# Patient Record
Sex: Female | Born: 1940
Health system: Southern US, Community
[De-identification: ages and names within clinical notes are randomized; demographics above are authoritative.]

## PROBLEM LIST (undated history)

## (undated) DIAGNOSIS — F419 Anxiety disorder, unspecified: Secondary | ICD-10-CM

## (undated) DIAGNOSIS — F329 Major depressive disorder, single episode, unspecified: Secondary | ICD-10-CM

## (undated) DIAGNOSIS — J45909 Unspecified asthma, uncomplicated: Secondary | ICD-10-CM

## (undated) DIAGNOSIS — F32A Depression, unspecified: Secondary | ICD-10-CM

## (undated) DIAGNOSIS — Z5111 Encounter for antineoplastic chemotherapy: Secondary | ICD-10-CM

## (undated) DIAGNOSIS — R519 Headache, unspecified: Secondary | ICD-10-CM

## (undated) DIAGNOSIS — Z9289 Personal history of other medical treatment: Secondary | ICD-10-CM

## (undated) DIAGNOSIS — G620 Drug-induced polyneuropathy: Secondary | ICD-10-CM

## (undated) DIAGNOSIS — R0902 Hypoxemia: Secondary | ICD-10-CM

## (undated) DIAGNOSIS — I509 Heart failure, unspecified: Secondary | ICD-10-CM

## (undated) DIAGNOSIS — G4700 Insomnia, unspecified: Secondary | ICD-10-CM

## (undated) DIAGNOSIS — IMO0002 Reserved for concepts with insufficient information to code with codable children: Secondary | ICD-10-CM

## (undated) DIAGNOSIS — J189 Pneumonia, unspecified organism: Secondary | ICD-10-CM

## (undated) DIAGNOSIS — E785 Hyperlipidemia, unspecified: Secondary | ICD-10-CM

## (undated) DIAGNOSIS — K219 Gastro-esophageal reflux disease without esophagitis: Secondary | ICD-10-CM

## (undated) DIAGNOSIS — I219 Acute myocardial infarction, unspecified: Secondary | ICD-10-CM

## (undated) DIAGNOSIS — J449 Chronic obstructive pulmonary disease, unspecified: Secondary | ICD-10-CM

## (undated) DIAGNOSIS — D649 Anemia, unspecified: Secondary | ICD-10-CM

## (undated) DIAGNOSIS — IMO0001 Reserved for inherently not codable concepts without codable children: Secondary | ICD-10-CM

## (undated) DIAGNOSIS — F40240 Claustrophobia: Secondary | ICD-10-CM

## (undated) DIAGNOSIS — C649 Malignant neoplasm of unspecified kidney, except renal pelvis: Secondary | ICD-10-CM

## (undated) DIAGNOSIS — R51 Headache: Secondary | ICD-10-CM

## (undated) DIAGNOSIS — H409 Unspecified glaucoma: Secondary | ICD-10-CM

## (undated) DIAGNOSIS — R011 Cardiac murmur, unspecified: Secondary | ICD-10-CM

## (undated) DIAGNOSIS — Z923 Personal history of irradiation: Secondary | ICD-10-CM

## (undated) DIAGNOSIS — M1990 Unspecified osteoarthritis, unspecified site: Secondary | ICD-10-CM

## (undated) DIAGNOSIS — C3491 Malignant neoplasm of unspecified part of right bronchus or lung: Secondary | ICD-10-CM

## (undated) DIAGNOSIS — I10 Essential (primary) hypertension: Secondary | ICD-10-CM

## (undated) DIAGNOSIS — T451X5A Adverse effect of antineoplastic and immunosuppressive drugs, initial encounter: Secondary | ICD-10-CM

## (undated) HISTORY — PX: TUBAL LIGATION: SHX77

## (undated) HISTORY — PX: NEPHRECTOMY: SHX65

## (undated) HISTORY — DX: Heart failure, unspecified: I50.9

## (undated) HISTORY — DX: Gastro-esophageal reflux disease without esophagitis: K21.9

## (undated) HISTORY — DX: Cardiac murmur, unspecified: R01.1

## (undated) HISTORY — DX: Encounter for antineoplastic chemotherapy: Z51.11

## (undated) HISTORY — DX: Unspecified asthma, uncomplicated: J45.909

## (undated) HISTORY — DX: Malignant neoplasm of unspecified part of right bronchus or lung: C34.91

## (undated) HISTORY — DX: Drug-induced polyneuropathy: G62.0

## (undated) HISTORY — DX: Hyperlipidemia, unspecified: E78.5

## (undated) HISTORY — DX: Hypoxemia: R09.02

## (undated) HISTORY — DX: Essential (primary) hypertension: I10

## (undated) HISTORY — DX: Unspecified glaucoma: H40.9

## (undated) HISTORY — PX: SPINE SURGERY: SHX786

## (undated) HISTORY — DX: Depression, unspecified: F32.A

## (undated) HISTORY — PX: BACK SURGERY: SHX140

## (undated) HISTORY — DX: Malignant neoplasm of unspecified kidney, except renal pelvis: C64.9

## (undated) HISTORY — DX: Adverse effect of antineoplastic and immunosuppressive drugs, initial encounter: T45.1X5A

## (undated) HISTORY — DX: Insomnia, unspecified: G47.00

## (undated) HISTORY — DX: Personal history of other medical treatment: Z92.89

## (undated) HISTORY — PX: LOBECTOMY: SHX5089

## (undated) HISTORY — DX: Reserved for concepts with insufficient information to code with codable children: IMO0002

## (undated) HISTORY — DX: Acute myocardial infarction, unspecified: I21.9

## (undated) HISTORY — PX: OTHER SURGICAL HISTORY: SHX169

## (undated) HISTORY — DX: Major depressive disorder, single episode, unspecified: F32.9

## (undated) HISTORY — PX: EYE SURGERY: SHX253

## (undated) HISTORY — DX: Reserved for inherently not codable concepts without codable children: IMO0001

---

## 1998-09-02 ENCOUNTER — Ambulatory Visit (HOSPITAL_COMMUNITY): Admission: RE | Admit: 1998-09-02 | Discharge: 1998-09-02 | Payer: Self-pay | Admitting: Urology

## 1998-09-02 ENCOUNTER — Encounter: Payer: Self-pay | Admitting: Urology

## 1998-09-19 ENCOUNTER — Other Ambulatory Visit: Admission: RE | Admit: 1998-09-19 | Discharge: 1998-09-19 | Payer: Self-pay | Admitting: Obstetrics and Gynecology

## 2000-03-12 ENCOUNTER — Encounter: Payer: Self-pay | Admitting: Urology

## 2000-03-12 ENCOUNTER — Ambulatory Visit (HOSPITAL_COMMUNITY): Admission: RE | Admit: 2000-03-12 | Discharge: 2000-03-12 | Payer: Self-pay | Admitting: Urology

## 2000-03-21 ENCOUNTER — Ambulatory Visit (HOSPITAL_COMMUNITY): Admission: RE | Admit: 2000-03-21 | Discharge: 2000-03-21 | Payer: Self-pay | Admitting: Urology

## 2000-03-21 ENCOUNTER — Encounter: Payer: Self-pay | Admitting: Urology

## 2000-05-04 ENCOUNTER — Encounter: Payer: Self-pay | Admitting: Internal Medicine

## 2000-05-04 ENCOUNTER — Ambulatory Visit (HOSPITAL_COMMUNITY): Admission: RE | Admit: 2000-05-04 | Discharge: 2000-05-04 | Payer: Self-pay | Admitting: Internal Medicine

## 2000-05-08 ENCOUNTER — Encounter: Payer: Self-pay | Admitting: Neurosurgery

## 2000-05-08 ENCOUNTER — Ambulatory Visit (HOSPITAL_COMMUNITY): Admission: RE | Admit: 2000-05-08 | Discharge: 2000-05-09 | Payer: Self-pay | Admitting: Neurosurgery

## 2000-05-30 ENCOUNTER — Encounter: Admission: RE | Admit: 2000-05-30 | Discharge: 2000-05-30 | Payer: Self-pay | Admitting: Neurosurgery

## 2000-05-30 ENCOUNTER — Encounter: Payer: Self-pay | Admitting: Neurosurgery

## 2000-07-01 ENCOUNTER — Encounter: Admission: RE | Admit: 2000-07-01 | Discharge: 2000-07-01 | Payer: Self-pay | Admitting: Neurosurgery

## 2000-07-01 ENCOUNTER — Encounter: Payer: Self-pay | Admitting: Neurosurgery

## 2000-08-20 ENCOUNTER — Encounter: Admission: RE | Admit: 2000-08-20 | Discharge: 2000-08-20 | Payer: Self-pay | Admitting: Neurosurgery

## 2000-08-20 ENCOUNTER — Encounter: Payer: Self-pay | Admitting: Neurosurgery

## 2001-08-20 ENCOUNTER — Encounter: Payer: Self-pay | Admitting: Neurosurgery

## 2001-08-20 ENCOUNTER — Encounter: Admission: RE | Admit: 2001-08-20 | Discharge: 2001-08-20 | Payer: Self-pay | Admitting: Neurosurgery

## 2009-12-08 LAB — LIPID PANEL
Cholesterol: 189 mg/dL (ref 0–200)
HDL: 75 mg/dL — AB (ref 35–70)
LDL Cholesterol: 100 mg/dL
Triglycerides: 72 mg/dL (ref 40–160)

## 2009-12-08 LAB — HM PAP SMEAR: HM Pap smear: NORMAL

## 2009-12-08 LAB — TSH: TSH: 0.82 u[IU]/mL (ref <=5.90)

## 2009-12-10 LAB — BASIC METABOLIC PANEL: Glucose: 105 mg/dL

## 2010-11-12 HISTORY — PX: NEPHRECTOMY: SHX65

## 2011-02-05 ENCOUNTER — Other Ambulatory Visit (HOSPITAL_COMMUNITY): Payer: Self-pay | Admitting: Urology

## 2011-02-05 DIAGNOSIS — N320 Bladder-neck obstruction: Secondary | ICD-10-CM

## 2011-02-09 ENCOUNTER — Encounter (HOSPITAL_COMMUNITY)
Admission: RE | Admit: 2011-02-09 | Discharge: 2011-02-09 | Disposition: A | Discharge status: Home / Self Care | Payer: Medicare Other | Source: Ambulatory Visit | Attending: Urology | Admitting: Urology

## 2011-02-09 ENCOUNTER — Other Ambulatory Visit (HOSPITAL_COMMUNITY): Payer: Self-pay | Admitting: Urology

## 2011-02-09 DIAGNOSIS — N289 Disorder of kidney and ureter, unspecified: Secondary | ICD-10-CM

## 2011-02-09 DIAGNOSIS — N320 Bladder-neck obstruction: Secondary | ICD-10-CM | POA: Insufficient documentation

## 2011-02-09 MED ORDER — TECHNETIUM TC 99M MERTIATIDE
15.0000 | Freq: Once | INTRAVENOUS | Status: AC | PRN
  Administered 2011-02-09: 15 via INTRAVENOUS

## 2011-02-10 ENCOUNTER — Ambulatory Visit (HOSPITAL_COMMUNITY)
Admission: RE | Admit: 2011-02-10 | Discharge: 2011-02-10 | Disposition: A | Discharge status: Home / Self Care | Payer: Medicare Other | Source: Ambulatory Visit | Attending: Urology | Admitting: Urology

## 2011-02-10 DIAGNOSIS — N289 Disorder of kidney and ureter, unspecified: Secondary | ICD-10-CM

## 2011-02-10 DIAGNOSIS — F99 Mental disorder, not otherwise specified: Secondary | ICD-10-CM | POA: Insufficient documentation

## 2011-02-10 MED ORDER — GADOBENATE DIMEGLUMINE 529 MG/ML IV SOLN
13.0000 mL | Freq: Once | INTRAVENOUS | Status: AC | PRN
  Administered 2011-02-10: 13 mL via INTRAVENOUS

## 2011-03-27 ENCOUNTER — Other Ambulatory Visit (HOSPITAL_COMMUNITY): Payer: Self-pay | Admitting: Urology

## 2011-03-27 ENCOUNTER — Ambulatory Visit (HOSPITAL_COMMUNITY)
Admission: RE | Admit: 2011-03-27 | Discharge: 2011-03-27 | Disposition: A | Discharge status: Home / Self Care | Payer: Medicare Other | Source: Ambulatory Visit | Attending: Urology | Admitting: Urology

## 2011-03-27 DIAGNOSIS — Q619 Cystic kidney disease, unspecified: Secondary | ICD-10-CM | POA: Insufficient documentation

## 2011-03-27 DIAGNOSIS — N289 Disorder of kidney and ureter, unspecified: Secondary | ICD-10-CM | POA: Insufficient documentation

## 2011-03-27 DIAGNOSIS — R05 Cough: Secondary | ICD-10-CM

## 2011-03-27 DIAGNOSIS — R059 Cough, unspecified: Secondary | ICD-10-CM

## 2011-03-27 DIAGNOSIS — IMO0001 Reserved for inherently not codable concepts without codable children: Secondary | ICD-10-CM

## 2011-04-11 ENCOUNTER — Other Ambulatory Visit: Payer: Self-pay | Admitting: Urology

## 2011-04-11 ENCOUNTER — Encounter (HOSPITAL_COMMUNITY): Payer: Medicare Other

## 2011-04-11 LAB — BASIC METABOLIC PANEL
BUN: 14 mg/dL (ref 6–23)
CO2: 32 mEq/L (ref 19–32)
Calcium: 10.3 mg/dL (ref 8.4–10.5)
Chloride: 101 mEq/L (ref 96–112)
Creatinine, Ser: 0.68 mg/dL (ref 0.4–1.2)
GFR calc Af Amer: >60 mL/min (ref >60)
GFR calc non Af Amer: >60 mL/min (ref >60)
Glucose, Bld: 93 mg/dL (ref 70–99)
Potassium: 4.7 mEq/L (ref 3.5–5.1)
Sodium: 141 mEq/L (ref 135–145)

## 2011-04-11 LAB — CBC
HCT: 48.7 % — ABNORMAL HIGH (ref 36.0–46.0)
Hemoglobin: 16.5 g/dL — ABNORMAL HIGH (ref 12.0–15.0)
MCH: 31.1 pg (ref 26.0–34.0)
MCHC: 33.9 g/dL (ref 30.0–36.0)
MCV: 91.7 fL (ref 78.0–100.0)
Platelets: 291 10*3/uL (ref 150–400)
RBC: 5.31 MIL/uL — ABNORMAL HIGH (ref 3.87–5.11)
RDW: 15.8 % — ABNORMAL HIGH (ref 11.5–15.5)
WBC: 10 10*3/uL (ref 4.0–10.5)

## 2011-04-11 LAB — SURGICAL PCR SCREEN
MRSA, PCR: NEGATIVE
Staphylococcus aureus: NEGATIVE

## 2011-04-11 LAB — ABO/RH: ABO/RH(D): A POS

## 2011-04-16 ENCOUNTER — Other Ambulatory Visit: Payer: Self-pay | Admitting: Urology

## 2011-04-16 ENCOUNTER — Inpatient Hospital Stay (HOSPITAL_COMMUNITY)
Admission: RE | Admit: 2011-04-16 | Discharge: 2011-04-22 | DRG: 656 | Disposition: A | Discharge status: Home / Self Care | Payer: Medicare Other | Source: Ambulatory Visit | Attending: Urology | Admitting: Urology

## 2011-04-16 DIAGNOSIS — R51 Headache: Secondary | ICD-10-CM | POA: Diagnosis not present

## 2011-04-16 DIAGNOSIS — E785 Hyperlipidemia, unspecified: Secondary | ICD-10-CM | POA: Diagnosis present

## 2011-04-16 DIAGNOSIS — R0902 Hypoxemia: Secondary | ICD-10-CM | POA: Diagnosis not present

## 2011-04-16 DIAGNOSIS — R110 Nausea: Secondary | ICD-10-CM | POA: Diagnosis not present

## 2011-04-16 DIAGNOSIS — I509 Heart failure, unspecified: Secondary | ICD-10-CM | POA: Diagnosis not present

## 2011-04-16 DIAGNOSIS — K219 Gastro-esophageal reflux disease without esophagitis: Secondary | ICD-10-CM | POA: Diagnosis present

## 2011-04-16 DIAGNOSIS — D72829 Elevated white blood cell count, unspecified: Secondary | ICD-10-CM | POA: Diagnosis not present

## 2011-04-16 DIAGNOSIS — Z87891 Personal history of nicotine dependence: Secondary | ICD-10-CM

## 2011-04-16 DIAGNOSIS — C649 Malignant neoplasm of unspecified kidney, except renal pelvis: Principal | ICD-10-CM | POA: Diagnosis present

## 2011-04-16 DIAGNOSIS — I10 Essential (primary) hypertension: Secondary | ICD-10-CM | POA: Diagnosis present

## 2011-04-16 DIAGNOSIS — I5033 Acute on chronic diastolic (congestive) heart failure: Secondary | ICD-10-CM | POA: Diagnosis not present

## 2011-04-16 DIAGNOSIS — E871 Hypo-osmolality and hyponatremia: Secondary | ICD-10-CM | POA: Diagnosis not present

## 2011-04-16 LAB — BASIC METABOLIC PANEL
BUN: 9 mg/dL (ref 6–23)
CO2: 29 mEq/L (ref 19–32)
Calcium: 8.7 mg/dL (ref 8.4–10.5)
Chloride: 101 mEq/L (ref 96–112)
Creatinine, Ser: 0.76 mg/dL (ref 0.4–1.2)
GFR calc Af Amer: >60 mL/min (ref >60)
GFR calc non Af Amer: >60 mL/min (ref >60)
Glucose, Bld: 118 mg/dL — ABNORMAL HIGH (ref 70–99)
Potassium: 3.9 mEq/L (ref 3.5–5.1)
Sodium: 138 mEq/L (ref 135–145)

## 2011-04-16 LAB — TYPE AND SCREEN
ABO/RH(D): A POS
Antibody Screen: NEGATIVE

## 2011-04-16 LAB — HEMOGLOBIN AND HEMATOCRIT, BLOOD
HCT: 42.6 % (ref 36.0–46.0)
Hemoglobin: 14 g/dL (ref 12.0–15.0)

## 2011-04-17 LAB — BASIC METABOLIC PANEL
BUN: 9 mg/dL (ref 6–23)
CO2: 31 mEq/L (ref 19–32)
Calcium: 8.4 mg/dL (ref 8.4–10.5)
Chloride: 98 mEq/L (ref 96–112)
Creatinine, Ser: 0.72 mg/dL (ref 0.4–1.2)
GFR calc Af Amer: >60 mL/min (ref >60)
GFR calc non Af Amer: >60 mL/min (ref >60)
Glucose, Bld: 153 mg/dL — ABNORMAL HIGH (ref 70–99)
Potassium: 4.3 mEq/L (ref 3.5–5.1)
Sodium: 134 mEq/L — ABNORMAL LOW (ref 135–145)

## 2011-04-17 LAB — HEMOGLOBIN AND HEMATOCRIT, BLOOD
HCT: 43.7 % (ref 36.0–46.0)
Hemoglobin: 14.2 g/dL (ref 12.0–15.0)

## 2011-04-18 ENCOUNTER — Inpatient Hospital Stay (HOSPITAL_COMMUNITY): Payer: Medicare Other

## 2011-04-18 LAB — DIFFERENTIAL
Basophils Absolute: 0 10*3/uL (ref 0.0–0.1)
Basophils Relative: 0 % (ref 0–1)
Eosinophils Absolute: 0 10*3/uL (ref 0.0–0.7)
Eosinophils Relative: 0 % (ref 0–5)
Lymphocytes Relative: 9 % — ABNORMAL LOW (ref 12–46)
Lymphs Abs: 1.1 10*3/uL (ref 0.7–4.0)
Monocytes Absolute: 1 10*3/uL (ref 0.1–1.0)
Monocytes Relative: 8 % (ref 3–12)
Neutro Abs: 10.7 10*3/uL — ABNORMAL HIGH (ref 1.7–7.7)
Neutrophils Relative %: 83 % — ABNORMAL HIGH (ref 43–77)

## 2011-04-18 LAB — CARDIAC PANEL(CRET KIN+CKTOT+MB+TROPI)
CK, MB: 3.5 ng/mL (ref 0.3–4.0)
Relative Index: 1.7 (ref 0.0–2.5)
Total CK: 204 U/L — ABNORMAL HIGH (ref 7–177)
Troponin I: <0.3 ng/mL (ref <0.30)

## 2011-04-18 LAB — MAGNESIUM: Magnesium: 1.9 mg/dL (ref 1.5–2.5)

## 2011-04-18 LAB — CBC
HCT: 41.6 % (ref 36.0–46.0)
Hemoglobin: 13.6 g/dL (ref 12.0–15.0)
MCH: 30.6 pg (ref 26.0–34.0)
MCHC: 32.7 g/dL (ref 30.0–36.0)
MCV: 93.5 fL (ref 78.0–100.0)
Platelets: 220 10*3/uL (ref 150–400)
RBC: 4.45 MIL/uL (ref 3.87–5.11)
RDW: 15 % (ref 11.5–15.5)
WBC: 12.9 10*3/uL — ABNORMAL HIGH (ref 4.0–10.5)

## 2011-04-18 LAB — D-DIMER, QUANTITATIVE: D-Dimer, Quant: 1.26 ug/mL-FEU — ABNORMAL HIGH (ref 0.00–0.48)

## 2011-04-18 LAB — PRO B NATRIURETIC PEPTIDE: Pro B Natriuretic peptide (BNP): 1055 pg/mL — ABNORMAL HIGH (ref 0–125)

## 2011-04-18 LAB — PHOSPHORUS: Phosphorus: 2.3 mg/dL (ref 2.3–4.6)

## 2011-04-18 MED ORDER — IOHEXOL 300 MG/ML  SOLN
100.0000 mL | Freq: Once | INTRAMUSCULAR | Status: AC | PRN
  Administered 2011-04-18: 100 mL via INTRAVENOUS

## 2011-04-18 NOTE — Op Note (Signed)
Tammy Ball, Tammy Ball                ACCOUNT NO.:  [id number]  MEDICAL RECORD NO.:  [id number]  LOCATION:  1415                         FACILITY:  [facility]  PHYSICIAN:  Heloise Purpura, MD      DATE OF BIRTH:  [birth year]  DATE OF PROCEDURE:  04/16/2011 DATE OF DISCHARGE:                              OPERATIVE REPORT   PREOPERATIVE DIAGNOSIS:  Left renal neoplasm.  POSTOPERATIVE DIAGNOSIS:  Left renal neoplasm.  PROCEDURE:  Left laparoscopic radical nephrectomy.  SURGEON:  Heloise Purpura, MD  ASSISTANT:  Delia Chimes, NP-C  ANESTHESIA:  General.  COMPLICATIONS:  None.  ESTIMATED BLOOD LOSS:  50 cc.  INTRAVENOUS FLUIDS:  1850 cc of crystalloid.  SPECIMEN:  Left kidney.  DISPOSITION:  Specimen to Pathology.  INDICATIONS:  Tammy Ball is a 70 year old female with a history of a left ureteropelvic junction obstruction, status post open repair many years ago.  She recently was incidentally found to have an enhancing neoplasm on the left kidney, concerning for renal malignancy.  Her mass was noted be somewhat centrally located and she did undergo a renal scan which demonstrated 27% relative renal function of the left kidney.  There was no evidence of metastatic disease on her evaluation and after reviewing options, she elected to proceed with the above procedures for treatment. The potential risks, complications, alternative treatment options were discussed in detail and informed consent obtained.  DESCRIPTION OF PROCEDURE:  The patient was taken to the operating room and general anesthetic was administered.  She was given preoperative antibiotics, placed in the left modified flank position, and prepped and draped in the usual sterile fashion.  Care was taken to pad all potential pressure points.  A site was then selected just superior to the umbilicus for placement of the camera port.  This was placed using a standard open Hasson technique which allowed entry in the  peritoneal cavity under direct vision without difficulty.  0 Vicryl holding sutures were then placed in the fascia and a 12 mm Hasson cannula was placed and secured with the sutures.  A 30-degree lens was then used to inspect the abdomen, there was no evidence of any intra-abdominal injuries.  There were noted be some adhesions in the left upper quadrant between the colon and the abdominal wall.  A 5 mm port was then placed in the left upper quadrant and a 12 mm port was placed in the left lower quadrant. Using the Harmonic scalpel, the colon was then reflected medially thereby exposing the kidney.  The space between the anterior layer of Gerota fascia and the mesocolon was developed and the ureter and gonadal vein were identified and lifted anteriorly off the psoas muscle. Dissection then proceeded superiorly and a large lower pole renal artery was identified and divided between multiple 10 mm Hem-o-lok clips.  More superiorly, a second renal artery was able to be identified and also divided between multiple Hem-o-lok clips.  There was noted to be some bleeding from the gonadal vein which was able to be controlled.  The renal vein was then isolated away from the surrounding structures and it was able to be stapled and divided with a 45 mm Flex ETS  stapler. Gerota fascia was intentionally entered superiorly thereby sparing the adrenal gland.  The patient's renal mass was able to be identified anteriorly and was well away from the upper pole of the kidney.  The Harmonic scalpel was then used to take down the splenorenal ligaments and the lateral attachments and posterior attachments of the kidney were also taken down with the Harmonic scalpel.  The gonadal vein and ureter were identified and isolated and able to be ligated with Hem-o-lok clips and subsequently divided.  The renal fossa was then carefully examined and irrigated.  Hemostasis appeared excellent and attention turned to removal  of the specimen.  The 12 mm left lower quadrant port site was examined and a 0 Vicryl suture was placed laparoscopically for subsequent later fascial closure.  A 15-mm EndoCatch II bag was then placed through the camera port site and the specimen was placed into this bag for removal.  All remaining port sites were then removed under direct vision and the previously placed 0 Vicryl suture was used to close a 12 mm left lower quadrant port site.  The camera port site was then extended in the periumbilical fashion along with the underlying rectus fascia.  The specimen was removed and this fascial opening was then closed with 2 running #1 PDS sutures. All port sites were injected with Exparel (bupivicaine) and reapproximated at the skin level with 4-0 monocryl subcuticular sutures. Dermabond was applied to the skin. She tolerated the procedure well and  without complications and was transferred to the recovery unit in satisfactory condition.     Heloise Purpura, MD     LB/MEDQ  D:  04/16/2011  T:  04/16/2011  Job:  [id number]  Electronically Signed by Heloise Purpura MD on 04/18/2011 10:48:54 PM

## 2011-04-18 NOTE — Consult Note (Signed)
Tammy Ball, Tammy Ball                ACCOUNT NO.:  [id number]  MEDICAL RECORD NO.:  [id number]  LOCATION:  PADM                         FACILITY:  [facility]  PHYSICIAN:  Rock Nephew, MD       DATE OF BIRTH:  [birth year]  DATE OF CONSULTATION:  04/18/2011 DATE OF DISCHARGE:                                CONSULTATION   CONSULTATION REQUESTED BY:  Jethro Bolus, MD  PRIMARY CARE PHYSICIAN:  [facility].  REASON FOR CONSULTATION:  Right-sided chest pain.  HISTORY OF PRESENT ILLNESS:  This is a 69 year old female with a past medical history of left renal neoplasm, also history of hypertension and hyperlipidemia, tobacco abuse.  The patient came in for a left laparoscopic radical nephrectomy; this was done on April 16, 2011.  I was consulted today on April 18, 2011, because the patient was having right- sided chest pain and she was found to be hypoxemic requiring 4 L of oxygen.  I talked with the patient.  She denies any heart palpitations. She is coughing up some whitish sputum.  She denies any fevers or any chills.  She denies any left arm radiation, left jaw radiation.  She reports she has never had any cardiac problems in the past.  Family history is significant for a 83 year old daughter with a cardiac bypass. I have obtained chest x-ray one-view for the patient.  There is a focus of airspace disease in the periphery of the right lung base, could be atelectasis or pneumonia.  PAST MEDICAL HISTORY:  Left nephrectomy secondary to neoplasm, history of hypertension, hyperlipidemia, history of tobacco abuse.  SOCIAL HISTORY:  The patient does not drink any alcohol.  The patient is a smoker.  She denies any illicit drug use.  She has no problem with activities of daily living.  FAMILY HISTORY:  Significant for heart disease; eldest daughter has a heart bypass at the age of 37.  REVIEW OF SYSTEMS:  She was reporting some headaches.  She was denying any blurry  vision.  She has chest pain.  She denies any shortness of breath.  She has some nausea but no vomiting.  She has some left-sided abdominal pain from the nephrectomy.  She reports she has passed gas once or twice.  She is on a clear full liquid diet.  She denies any burning on urination.  She denies any diarrhea.  She denies any pain in her legs.  PHYSICAL EXAMINATION:  CURRENT VITAL SIGNS:  Temperature 98.4, pulse 66, respiratory rate is 16, blood pressure is 128/70, 97% saturation on 4 L nasal cannula. GENERAL:  She is alert, awake, oriented x3. HEENT:  Normocephalic, atraumatic.  Pupils equally round, reactive to light. CARDIOVASCULAR:  S1, S2, regular rate and rhythm.  No murmurs or rubs. LUNGS:  Clear to auscultation bilaterally.  No wheezes or rhonchi. ABDOMEN:  Soft, nondistended. She has some slight tenderness.  Bowel sounds positive.  No guarding or rebound tenderness. EXTREMITIES:  No lower extremity edema evident.  She is alert, awake, oriented x3.  RADIOLOGICAL STUDIES:  The patient's chest x-ray shows focus of airspace disease in the periphery of the right lung base, could be related to  atelectasis and/or pneumonia.  LABORATORY STUDIES:  We do not have any laboratory studies right now but I have ordered a CBC with diff.  I have ordered cardiac enzymes, and I have ordered a D-dimer.  IMPRESSION AND PLAN:  This is a 70 year old female, status post left radical nephrectomy, postoperative day #2.  We are consulted for evaluation of chest pain: 1. Chest pain:  Differential for this chest pain could be unstable     angina, non-ST-elevation myocardial infarction, could be pulmonary     embolism, could be noncardiac chest pain.  The patient will be     started currently on aspirin 81 mg p.o. daily.  We will cycle the     patient's cardiac enzymes x3.  Will check a D-dimer.  If the D-     dimer is positive, we will get a CT angiogram of chest to rule out     a PE.  The patient  will get incentive spirometry. 2. Hypoxemia could be related to atelectasis:  We will encourage the     patient to use incentive spirometry every hour while awake.  We     will also check a pro BNP for this patient. 3. Atelectasis, possible pneumonia:  This looks like atelectasis for     the time being.  The patient has no fevers.  She is coughing up a     whitish sputum.  Will check a CBC with differential for this     patient.  Will continue to monitor. 4. Hypertension:  The patient's blood pressure is controlled.     Currently, the patient is not requiring amlodipine. 5. Hyperlipidemia:  The patient is taking Crestor currently.  She     takes a lipid medication at home. 6. Deep venous thrombosis prophylaxis:  The patient is receiving     sequential compressive devices for deep venous thrombosis     prophylaxis.  Thank you for the consult.     Rock Nephew, MD     NH/MEDQ  D:  04/18/2011  T:  04/18/2011  Job:  [id number]  Electronically Signed by Rock Nephew MD on 04/18/2011 10:51:09 PM

## 2011-04-19 LAB — CARDIAC PANEL(CRET KIN+CKTOT+MB+TROPI)
CK, MB: 3.3 ng/mL (ref 0.3–4.0)
CK, MB: 3.4 ng/mL (ref 0.3–4.0)
Relative Index: 1.8 (ref 0.0–2.5)
Relative Index: 2.2 (ref 0.0–2.5)
Total CK: 180 U/L — ABNORMAL HIGH (ref 7–177)
Total CK: 155 U/L (ref 7–177)
Troponin I: <0.3 ng/mL (ref <0.30)
Troponin I: <0.3 ng/mL (ref <0.30)

## 2011-04-19 LAB — BASIC METABOLIC PANEL
BUN: 3 mg/dL — ABNORMAL LOW (ref 6–23)
BUN: 3 mg/dL — ABNORMAL LOW (ref 6–23)
CO2: 34 mEq/L — ABNORMAL HIGH (ref 19–32)
CO2: 39 mEq/L — ABNORMAL HIGH (ref 19–32)
Calcium: 9.1 mg/dL (ref 8.4–10.5)
Calcium: 8.9 mg/dL (ref 8.4–10.5)
Chloride: 87 mEq/L — ABNORMAL LOW (ref 96–112)
Chloride: 81 mEq/L — ABNORMAL LOW (ref 96–112)
Creatinine, Ser: 0.53 mg/dL (ref 0.4–1.2)
Creatinine, Ser: 0.55 mg/dL (ref 0.4–1.2)
GFR calc Af Amer: >60 mL/min (ref >60)
GFR calc Af Amer: >60 mL/min (ref >60)
GFR calc non Af Amer: >60 mL/min (ref >60)
GFR calc non Af Amer: >60 mL/min (ref >60)
Glucose, Bld: 130 mg/dL — ABNORMAL HIGH (ref 70–99)
Glucose, Bld: 104 mg/dL — ABNORMAL HIGH (ref 70–99)
Potassium: 3.9 mEq/L (ref 3.5–5.1)
Potassium: 3.5 mEq/L (ref 3.5–5.1)
Sodium: 125 mEq/L — ABNORMAL LOW (ref 135–145)
Sodium: 124 mEq/L — ABNORMAL LOW (ref 135–145)

## 2011-04-19 LAB — DIFFERENTIAL
Basophils Absolute: 0 10*3/uL (ref 0.0–0.1)
Basophils Relative: 0 % (ref 0–1)
Eosinophils Absolute: 0 10*3/uL (ref 0.0–0.7)
Eosinophils Relative: 0 % (ref 0–5)
Lymphocytes Relative: 9 % — ABNORMAL LOW (ref 12–46)
Lymphs Abs: 1.1 10*3/uL (ref 0.7–4.0)
Monocytes Absolute: 0.9 10*3/uL (ref 0.1–1.0)
Monocytes Relative: 7 % (ref 3–12)
Neutro Abs: 10.8 10*3/uL — ABNORMAL HIGH (ref 1.7–7.7)
Neutrophils Relative %: 84 % — ABNORMAL HIGH (ref 43–77)

## 2011-04-19 LAB — CBC
HCT: 40.3 % (ref 36.0–46.0)
Hemoglobin: 13.3 g/dL (ref 12.0–15.0)
MCH: 30.7 pg (ref 26.0–34.0)
MCHC: 33 g/dL (ref 30.0–36.0)
MCV: 93.1 fL (ref 78.0–100.0)
Platelets: 200 10*3/uL (ref 150–400)
RBC: 4.33 MIL/uL (ref 3.87–5.11)
RDW: 14.9 % (ref 11.5–15.5)
WBC: 12.9 10*3/uL — ABNORMAL HIGH (ref 4.0–10.5)

## 2011-04-19 LAB — MAGNESIUM: Magnesium: 1.9 mg/dL (ref 1.5–2.5)

## 2011-04-19 LAB — PHOSPHORUS: Phosphorus: 2 mg/dL — ABNORMAL LOW (ref 2.3–4.6)

## 2011-04-20 LAB — CBC
HCT: 41.1 % (ref 36.0–46.0)
Hemoglobin: 13.6 g/dL (ref 12.0–15.0)
MCH: 30.5 pg (ref 26.0–34.0)
MCHC: 33.1 g/dL (ref 30.0–36.0)
MCV: 92.2 fL (ref 78.0–100.0)
Platelets: 244 10*3/uL (ref 150–400)
RBC: 4.46 MIL/uL (ref 3.87–5.11)
RDW: 14.8 % (ref 11.5–15.5)
WBC: 10.5 10*3/uL (ref 4.0–10.5)

## 2011-04-20 LAB — OSMOLALITY, URINE: Osmolality, Ur: 292 mOsm/kg — ABNORMAL LOW (ref 390–1090)

## 2011-04-20 LAB — BASIC METABOLIC PANEL
BUN: 6 mg/dL (ref 6–23)
CO2: 39 mEq/L — ABNORMAL HIGH (ref 19–32)
Calcium: 8.9 mg/dL (ref 8.4–10.5)
Chloride: 87 mEq/L — ABNORMAL LOW (ref 96–112)
Creatinine, Ser: 0.56 mg/dL (ref 0.4–1.2)
GFR calc Af Amer: >60 mL/min (ref >60)
GFR calc non Af Amer: >60 mL/min (ref >60)
Glucose, Bld: 97 mg/dL (ref 70–99)
Potassium: 3.6 mEq/L (ref 3.5–5.1)
Sodium: 131 mEq/L — ABNORMAL LOW (ref 135–145)

## 2011-04-20 LAB — SODIUM, URINE, RANDOM: Sodium, Ur: 60 mEq/L

## 2011-04-20 LAB — CREATININE, URINE, RANDOM: Creatinine, Urine: 56.7 mg/dL

## 2011-04-20 LAB — TSH: TSH: 0.228 u[IU]/mL — ABNORMAL LOW (ref 0.350–4.500)

## 2011-04-20 LAB — OSMOLALITY: Osmolality: 264 mOsm/kg — ABNORMAL LOW (ref 275–300)

## 2011-04-21 LAB — BASIC METABOLIC PANEL
BUN: 11 mg/dL (ref 6–23)
CO2: 42 mEq/L (ref 19–32)
Calcium: 8.9 mg/dL (ref 8.4–10.5)
Chloride: 87 mEq/L — ABNORMAL LOW (ref 96–112)
Creatinine, Ser: 0.83 mg/dL (ref 0.4–1.2)
GFR calc Af Amer: >60 mL/min (ref >60)
GFR calc non Af Amer: >60 mL/min (ref >60)
Glucose, Bld: 95 mg/dL (ref 70–99)
Potassium: 3.6 mEq/L (ref 3.5–5.1)
Sodium: 135 mEq/L (ref 135–145)

## 2011-04-21 LAB — PHOSPHORUS: Phosphorus: 2.9 mg/dL (ref 2.3–4.6)

## 2011-04-21 LAB — T4, FREE: Free T4: 1.13 ng/dL (ref 0.80–1.80)

## 2011-04-22 LAB — CBC
HCT: 43.6 % (ref 36.0–46.0)
Hemoglobin: 14.3 g/dL (ref 12.0–15.0)
MCH: 30.5 pg (ref 26.0–34.0)
MCHC: 32.8 g/dL (ref 30.0–36.0)
MCV: 93 fL (ref 78.0–100.0)
Platelets: 267 10*3/uL (ref 150–400)
RBC: 4.69 MIL/uL (ref 3.87–5.11)
RDW: 15.5 % (ref 11.5–15.5)
WBC: 9.4 10*3/uL (ref 4.0–10.5)

## 2011-04-22 LAB — BASIC METABOLIC PANEL
BUN: 19 mg/dL (ref 6–23)
CO2: 43 mEq/L (ref 19–32)
Calcium: 9.5 mg/dL (ref 8.4–10.5)
Chloride: 86 mEq/L — ABNORMAL LOW (ref 96–112)
Creatinine, Ser: 0.82 mg/dL (ref 0.4–1.2)
GFR calc Af Amer: >60 mL/min (ref >60)
GFR calc non Af Amer: >60 mL/min (ref >60)
Glucose, Bld: 100 mg/dL — ABNORMAL HIGH (ref 70–99)
Potassium: 4.3 mEq/L (ref 3.5–5.1)
Sodium: 134 mEq/L — ABNORMAL LOW (ref 135–145)

## 2011-04-23 LAB — BLOOD GAS, ARTERIAL
Acid-Base Excess: 14.7 mmol/L — ABNORMAL HIGH (ref 0.0–2.0)
Bicarbonate: 42.4 mEq/L — ABNORMAL HIGH (ref 20.0–24.0)
Drawn by: 295031
O2 Content: 1 L/min
O2 Saturation: 94.6 %
Patient temperature: 98.6
TCO2: 36.5 mmol/L (ref 0–100)
pCO2 arterial: 63.8 mmHg (ref 35.0–45.0)
pH, Arterial: 7.438 — ABNORMAL HIGH (ref 7.350–7.400)
pO2, Arterial: 69.1 mmHg — ABNORMAL LOW (ref 80.0–100.0)

## 2011-05-03 NOTE — Discharge Summary (Signed)
NAMEJENSEN, Tammy Ball                ACCOUNT NO.:  [id number]  MEDICAL RECORD NO.:  [id number]  LOCATION:  1415                         FACILITY:  [facility]  PHYSICIAN:  Heloise Purpura, MD      DATE OF BIRTH:  [birth year]  DATE OF ADMISSION:  04/16/2011 DATE OF DISCHARGE:  04/22/2011                              DISCHARGE SUMMARY   ADMISSION DIAGNOSIS:  Left renal neoplasm.  DISCHARGE DIAGNOSES: 1. Renal cell carcinoma. 2. Hypoxemia likely related to atelectasis/history of tobacco use. 3. Hyponatremia.  SECONDARY DIAGNOSES: 1. Hypertension 2. Hyperlipidemia. 3. History of tobacco use.  HISTORY AND PHYSICAL:  For full details, please see admission history and physical.  Briefly, Tammy Ball is a 70 year old female with a history of a left ureteropelvic junction obstruction.  She was recently incidentally found to have an enhancing left renal mass and underwent metastatic evaluation which was negative.  On nuclear medicine renal scan, she was found to have approximately 27% relative renal function of the left kidney.  Her mass was not amendable to nephron sparing surgery. We therefore discussed management options for treatment and she elected to proceed with a left nephrectomy.  It was planned to proceed with a left laparoscopic nephrectomy, although she understood the potential for necessitating an open nephrectomy based on her prior left renal surgery and considering that she underwent a left pyeloplasty in the past.  HOSPITAL COURSE:  On April 16, 2011, she was taken to the operating room and underwent a left laparoscopic radical nephrectomy.  She tolerated the procedure well without complications.  Postoperatively, she was transferred to a regular hospital room.  She was monitored that evening and remained hemodynamically stable.  n the morning of postoperative day #1, her hemoglobin was 14.2 and her renal function remained stable with a creatinine of 0.72.  Her renal function  remained stable throughout her hospitalization and her creatinine was 0.82 upon discharge.  On postoperative day #1, she was noted to be somewhat somnolent and was unable to ambulate very well.  She also had significant nausea.  It was felt that many of her symptoms may have been related to narcotics which she has not tolerated well in the past.  Considering that she was not having severe incisional pain, her pain medications were therefore switched to acetaminophen and tramadol which she appeared to tolerate much better.  She was able to gradually begin ambulating which progressively improved throughout her hospitalization until she was able to ambulate without significant difficulty prior to discharge home.  She continued to have some significant nausea although did have eventual return of bowel function and her diet was advanced accordingly until she was tolerating a regular diet prior to discharge.  She was administered nutritional supplementation with Ensure during her hospitalization.  On the evening of postoperative day #2, she did complain of right-sided chest pain.  She therefore was evaluated by myself as well as the hospitalist internal medicine service.  She was eventually ruled out for myocardial infarction.  She also had a D-dimer which was mildly elevated which prompted a CT angiogram of the chest and no pulmonary embolus was identified.  She continued to remain hypoxic during her hospital  course and although her chest imaging indicated possible atelectasis, it was also noted that she may have had some fluid overload.  She therefore underwent diuresis with furosemide and her pulmonary status very gradually improved.  Upon discharge from the hospital, her oxygen saturation levels were over 90% on room air.  It was felt that she did not require further evaluation or home oxygen per the hospitalist service.  On April 19, 2011, she had also been noted to be hyponatremic with a  sodium of 125.  This was further evaluated by the internal medicine team and a 2-D echocardiogram was also obtained based on this finding as well as an elevated BMP.  While her left ventricular function appeared to be well preserved, it was felt that she might have some congestive heart failure as a component of her fluid overload, hyponatremia, and elevated BNP.  She was placed on free water restriction and her sodium levels were monitored and did gradually improve over the next few days and it was 134 prior to discharge.  On April 23, 2011, she was noted to be stable for discharge from both a medicine and neurologic perspective and was therefore discharged home.  DISPOSITION:  Home.  DISCHARGE MEDICATIONS:  She is instructed to resume her regular home medications.  In addition, she was given a prescription for tramadol 50 to 100 mg p.o. q.6 h p.r.n.  She was also placed on Colace 100 mg p.o. b.i.d. as a stool softener.  INSTRUCTIONS:  She was instructed to be ambulatory but specifically told to refrain from any heavy lifting, strenuous activity, or driving.  She was instructed on no signs and symptoms of wound infection and was told to call should she have any significant difficulties.  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  This pathology report and its implications were discussed with the patient during her hospitalization.  Her prognosis is quite good.  FOLLOWUP:  She will follow up as planned in the next 3 to 4 weeks for further evaluation.     Heloise Purpura, MD     LB/MEDQ  D:  04/22/2011  T:  04/23/2011  Job:  [id number]  Electronically Signed by Heloise Purpura MD on 05/03/2011 11:32:36 PM

## 2011-10-24 ENCOUNTER — Ambulatory Visit (HOSPITAL_COMMUNITY)
Admission: RE | Admit: 2011-10-24 | Discharge: 2011-10-24 | Disposition: A | Discharge status: Home / Self Care | Payer: Medicare Other | Source: Ambulatory Visit | Attending: Urology | Admitting: Urology

## 2011-10-24 ENCOUNTER — Other Ambulatory Visit (HOSPITAL_COMMUNITY): Payer: Self-pay | Admitting: Urology

## 2011-10-24 DIAGNOSIS — C649 Malignant neoplasm of unspecified kidney, except renal pelvis: Secondary | ICD-10-CM

## 2011-11-15 ENCOUNTER — Other Ambulatory Visit: Payer: Self-pay

## 2011-11-15 ENCOUNTER — Encounter: Payer: Self-pay | Admitting: *Deleted

## 2011-11-15 ENCOUNTER — Ambulatory Visit (INDEPENDENT_AMBULATORY_CARE_PROVIDER_SITE_OTHER): Payer: Medicare Other

## 2011-11-15 ENCOUNTER — Emergency Department (HOSPITAL_COMMUNITY)
Admission: EM | Admit: 2011-11-15 | Discharge: 2011-11-16 | Disposition: A | Discharge status: Home / Self Care | Payer: Medicare Other | Attending: Emergency Medicine | Admitting: Emergency Medicine

## 2011-11-15 ENCOUNTER — Emergency Department (HOSPITAL_COMMUNITY): Payer: Medicare Other

## 2011-11-15 DIAGNOSIS — R059 Cough, unspecified: Secondary | ICD-10-CM | POA: Diagnosis not present

## 2011-11-15 DIAGNOSIS — J4489 Other specified chronic obstructive pulmonary disease: Secondary | ICD-10-CM | POA: Diagnosis not present

## 2011-11-15 DIAGNOSIS — R062 Wheezing: Secondary | ICD-10-CM | POA: Insufficient documentation

## 2011-11-15 DIAGNOSIS — R0902 Hypoxemia: Secondary | ICD-10-CM

## 2011-11-15 DIAGNOSIS — E7800 Pure hypercholesterolemia, unspecified: Secondary | ICD-10-CM | POA: Insufficient documentation

## 2011-11-15 DIAGNOSIS — J189 Pneumonia, unspecified organism: Secondary | ICD-10-CM | POA: Diagnosis not present

## 2011-11-15 DIAGNOSIS — J449 Chronic obstructive pulmonary disease, unspecified: Secondary | ICD-10-CM

## 2011-11-15 DIAGNOSIS — Z85528 Personal history of other malignant neoplasm of kidney: Secondary | ICD-10-CM | POA: Diagnosis not present

## 2011-11-15 DIAGNOSIS — R0789 Other chest pain: Secondary | ICD-10-CM | POA: Diagnosis not present

## 2011-11-15 DIAGNOSIS — R0602 Shortness of breath: Secondary | ICD-10-CM | POA: Diagnosis not present

## 2011-11-15 DIAGNOSIS — I10 Essential (primary) hypertension: Secondary | ICD-10-CM | POA: Diagnosis not present

## 2011-11-15 DIAGNOSIS — R000 Tachycardia, unspecified: Secondary | ICD-10-CM | POA: Insufficient documentation

## 2011-11-15 DIAGNOSIS — Z79899 Other long term (current) drug therapy: Secondary | ICD-10-CM | POA: Diagnosis not present

## 2011-11-15 DIAGNOSIS — J984 Other disorders of lung: Secondary | ICD-10-CM | POA: Diagnosis not present

## 2011-11-15 DIAGNOSIS — R05 Cough: Secondary | ICD-10-CM | POA: Insufficient documentation

## 2011-11-15 HISTORY — DX: Chronic obstructive pulmonary disease, unspecified: J44.9

## 2011-11-15 LAB — DIFFERENTIAL
Basophils Absolute: 0.1 10*3/uL (ref 0.0–0.1)
Basophils Relative: 1 % (ref 0–1)
Eosinophils Absolute: 0.1 10*3/uL (ref 0.0–0.7)
Eosinophils Relative: 1 % (ref 0–5)
Lymphocytes Relative: 16 % (ref 12–46)
Lymphs Abs: 1.4 10*3/uL (ref 0.7–4.0)
Monocytes Absolute: 0.3 10*3/uL (ref 0.1–1.0)
Monocytes Relative: 3 % (ref 3–12)
Neutro Abs: 7.3 10*3/uL (ref 1.7–7.7)
Neutrophils Relative %: 79 % — ABNORMAL HIGH (ref 43–77)

## 2011-11-15 LAB — CBC
HCT: 39.1 % (ref 36.0–46.0)
Hemoglobin: 12.7 g/dL (ref 12.0–15.0)
MCH: 28.1 pg (ref 26.0–34.0)
MCHC: 32.5 g/dL (ref 30.0–36.0)
MCV: 86.5 fL (ref 78.0–100.0)
Platelets: 310 10*3/uL (ref 150–400)
RBC: 4.52 MIL/uL (ref 3.87–5.11)
RDW: 16.3 % — ABNORMAL HIGH (ref 11.5–15.5)
WBC: 9.2 10*3/uL (ref 4.0–10.5)

## 2011-11-15 LAB — POCT I-STAT, CHEM 8
BUN: 20 mg/dL (ref 6–23)
Calcium, Ion: 1.16 mmol/L (ref 1.12–1.32)
Chloride: 105 mEq/L (ref 96–112)
Creatinine, Ser: 1.2 mg/dL — ABNORMAL HIGH (ref 0.50–1.10)
Glucose, Bld: 131 mg/dL — ABNORMAL HIGH (ref 70–99)
HCT: 44 % (ref 36.0–46.0)
Hemoglobin: 15 g/dL (ref 12.0–15.0)
Potassium: 3.7 mEq/L (ref 3.5–5.1)
Sodium: 141 mEq/L (ref 135–145)
TCO2: 27 mmol/L (ref 0–100)

## 2011-11-15 MED ORDER — ALBUTEROL SULFATE (5 MG/ML) 0.5% IN NEBU
INHALATION_SOLUTION | RESPIRATORY_TRACT | Status: AC
  Administered 2011-11-15: 5 mg
  Filled 2011-11-15: qty 1

## 2011-11-15 MED ORDER — SODIUM CHLORIDE 0.9 % IV BOLUS (SEPSIS)
250.0000 mL | Freq: Once | INTRAVENOUS | Status: AC
  Administered 2011-11-15: 250 mL via INTRAVENOUS

## 2011-11-15 MED ORDER — CEFTRIAXONE SODIUM 1 G IJ SOLR
1.0000 g | Freq: Once | INTRAMUSCULAR | Status: AC
  Administered 2011-11-16: 1 g via INTRAVENOUS
  Filled 2011-11-15: qty 10

## 2011-11-15 MED ORDER — METHYLPREDNISOLONE SODIUM SUCC 125 MG IJ SOLR: 125.0000 mg | Freq: Once | INTRAMUSCULAR | Status: DC

## 2011-11-15 MED ORDER — AZITHROMYCIN 250 MG PO TABS
500.0000 mg | ORAL_TABLET | Freq: Once | ORAL | Status: AC
  Administered 2011-11-16: 500 mg via ORAL
  Filled 2011-11-15 (×2): qty 1

## 2011-11-15 MED ORDER — METHYLPREDNISOLONE SODIUM SUCC 125 MG IJ SOLR
INTRAMUSCULAR | Status: AC
  Administered 2011-11-15: 125 mg
  Filled 2011-11-15: qty 2

## 2011-11-15 MED ORDER — ALBUTEROL SULFATE HFA 108 (90 BASE) MCG/ACT IN AERS
2.0000 | INHALATION_SPRAY | RESPIRATORY_TRACT | Status: DC | PRN
  Administered 2011-11-16: 2 via RESPIRATORY_TRACT
  Filled 2011-11-15: qty 6.7

## 2011-11-15 NOTE — ED Notes (Signed)
[id number]<YN> Expected date:<BR> Expected time:<BR> Means of arrival:<BR> Comments:<BR> EMS/COPD/mild resp distress

## 2011-11-15 NOTE — ED Provider Notes (Signed)
History     CSN: [id number]  Arrival date & time 11/15/11  1933   First MD Initiated Contact with Patient 11/15/11 2040      Chief Complaint  Patient presents with  . Shortness of Breath    (Consider location/radiation/quality/duration/timing/severity/associated sxs/prior treatment) HPI Comments: Patient reports a nonproductive cough for the last week and a half.  Today when to her primary care physician for increased coughing, shortness of breath, and chest pressure.  She states she feels like there is congestion in the middle part of her chest, which she could just cough up she would feel better she reports, that she quit smoking in June has been healthy since, but has had a significant weight gain.  Has hypertension, which is controlled with medication, and high cholesterol, for which he takes Lipitor.   Patient is a 71 y.o. female presenting with shortness of breath. The history is provided by the patient.  Shortness of Breath  The current episode started more than 1 week ago. The problem occurs occasionally. The problem has been gradually worsening. The problem is moderate. The symptoms are relieved by nothing. The symptoms are aggravated by nothing. Associated symptoms include chest pressure, cough, shortness of breath and wheezing. Pertinent negatives include no chest pain, no fever and no rhinorrhea. She has had no prior steroid use. Her past medical history does not include asthma, bronchiolitis or past wheezing. Recently, medical care has been given by the PCP and by EMS.    Past Medical History  Diagnosis Date  . COPD (chronic obstructive pulmonary disease)   . History of kidney cancer     Past Surgical History  Procedure Date  . Nephrectomy   . Kidney cancer     History reviewed. No pertinent family history.  History  Substance Use Topics  . Smoking status: Former Games developer  . Smokeless tobacco: Not on file  . Alcohol Use: No    OB History    Grav Para Term Preterm  Abortions TAB SAB Ect Mult Living                  Review of Systems  Constitutional: Negative for fever.  HENT: Negative for rhinorrhea.   Respiratory: Positive for cough, chest tightness, shortness of breath and wheezing.   Cardiovascular: Negative for chest pain.  Gastrointestinal: Negative for nausea.  Genitourinary: Negative.   Musculoskeletal: Negative for myalgias.  Skin: Negative for color change.  Neurological: Negative for dizziness and weakness.  Hematological: Negative.   Psychiatric/Behavioral: Negative.     Allergies  Review of patient's allergies indicates no known allergies.  Home Medications   Current Outpatient Rx  Name Route Sig Dispense Refill  . ALBUTEROL SULFATE (5 MG/ML) 0.5% IN NEBU Nebulization Take 2.5 mg by nebulization every 6 (six) hours as needed.      Marland Kitchen AMLODIPINE BESYLATE 5 MG PO TABS Oral Take 5 mg by mouth daily.      . ASPIRIN EC 81 MG PO TBEC Oral Take 81 mg by mouth daily.      . ATORVASTATIN CALCIUM 40 MG PO TABS Oral Take 40 mg by mouth daily.      Marland Kitchen CALCIUM + D PO Oral Take 1 tablet by mouth daily.      Marland Kitchen DM-GUAIFENESIN ER 30-600 MG PO TB12 Oral Take 1 tablet by mouth every 12 (twelve) hours as needed. For cough/congestion.     . OMEGA-3 FATTY ACIDS 1000 MG PO CAPS Oral Take 1 g by mouth daily.      Marland Kitchen  METHYLPREDNISOLONE SODIUM SUCC 125 MG IJ SOLR Intravenous Inject 125 mg into the vein once.      . ALBUTEROL SULFATE HFA 108 (90 BASE) MCG/ACT IN AERS Inhalation Inhale 2 puffs into the lungs every 4 (four) hours as needed for wheezing or shortness of breath. 1 Inhaler 0  . PREDNISONE 10 MG PO TABS Oral Take 2 tablets (20 mg total) by mouth daily. 15 tablet 0    BP 152/72  Pulse 91  Temp(Src) 98.6 F (37 C) (Oral)  Resp 18  SpO2 92%  Physical Exam  Constitutional: She is oriented to person, place, and time. She appears well-developed and well-nourished.  HENT:  Head: Normocephalic.  Eyes: Pupils are equal, round, and reactive to  light.  Neck: Normal range of motion.  Cardiovascular: Tachycardia present.        After albuterol neb treatment   Pulmonary/Chest: Breath sounds normal. She has no wheezes. She has no rales. She exhibits no tenderness.  Abdominal: Soft. Bowel sounds are normal.  Musculoskeletal: Normal range of motion.  Neurological: She is oriented to person, place, and time.  Skin: Skin is warm and dry.  Psychiatric: She has a normal mood and affect.    ED Course  Procedures (including critical care time)  Labs Reviewed  CBC - Abnormal; Notable for the following:    RDW 16.3 (*)    All other components within normal limits  DIFFERENTIAL - Abnormal; Notable for the following:    Neutrophils Relative 79 (*)    All other components within normal limits  POCT I-STAT, CHEM 8 - Abnormal; Notable for the following:    Creatinine, Ser 1.20 (*)    Glucose, Bld 131 (*)    All other components within normal limits  I-STAT, CHEM 8   Dg Chest 2 View  11/15/2011  *RADIOLOGY REPORT*  Clinical Data: Cough, shortness of breath.  CHEST - 2 VIEW  Comparison: 10/24/2011  Findings: Mild hyperinflation of the lungs.  Heart mediastinal contours within normal limits.  Right lung is clear.  Density at the left lung base could represent atelectasis or developing infiltrate.  No effusions.  No acute bony abnormality.  IMPRESSION: COPD.  Left basilar atelectasis or developing infiltrate.  Original Report Authenticated By: Cyndie Chime, M.D.     1. CAP (community acquired pneumonia)   2. COPD (chronic obstructive pulmonary disease)     Reviewed.  Chest x-ray with patient and family in the fact that it does reveal COPD and an early infiltrate.  Will discharge patient with antibiotic as well as steroids and an inhaler Patient started on antibiotic provided with an albuterol inhaler and taught its use.  Will discharge him with a prescription for steroids, antibiotics, and regular, albuterol inhaler use for 2 days.   Followup with primary care physician MDM  Will review chest x-ray for evaluation of pneumonia versus bronchitis, as well as CBC and a set, and EKG        Arman Filter, NP 11/15/11 2100  Arman Filter, NP 11/16/11 0117  Arman Filter, NP 11/16/11 2130

## 2011-11-15 NOTE — ED Notes (Signed)
Per EMS: EMS pt called out to [facility] for shortness of breath, productive cough for a couple of weeks. Pt tried mucinex without relief. Pt reports o2 stats at "mid 80's." staff gave albuterol/atrovent. Pt on o2. Pt has expiratory wheezing and rhonchi in her lower fields. Pt is A&O x4. Pt was ambulatory to the stretcher. ems denies any signs of shortness of breath while ambulating.

## 2011-11-16 MED ORDER — AZITHROMYCIN 250 MG PO TABS: 250.0000 mg | ORAL_TABLET | Freq: Every day | ORAL | Status: AC

## 2011-11-16 MED ORDER — PREDNISONE 10 MG PO TABS: 20.0000 mg | ORAL_TABLET | Freq: Every day | ORAL | Status: DC

## 2011-11-16 MED ORDER — ALBUTEROL SULFATE HFA 108 (90 BASE) MCG/ACT IN AERS: 2.0000 | INHALATION_SPRAY | RESPIRATORY_TRACT | Status: DC | PRN

## 2011-11-18 NOTE — ED Provider Notes (Signed)
Medical screening examination/treatment/procedure(s) were performed by non-physician practitioner and as supervising physician I was immediately available for consultation/collaboration.  Flint Melter, MD 11/18/11 [phone]

## 2011-11-22 ENCOUNTER — Ambulatory Visit (INDEPENDENT_AMBULATORY_CARE_PROVIDER_SITE_OTHER): Payer: Medicare Other

## 2011-11-22 DIAGNOSIS — R059 Cough, unspecified: Secondary | ICD-10-CM | POA: Diagnosis not present

## 2011-11-22 DIAGNOSIS — J158 Pneumonia due to other specified bacteria: Secondary | ICD-10-CM | POA: Diagnosis not present

## 2011-11-22 DIAGNOSIS — R05 Cough: Secondary | ICD-10-CM | POA: Diagnosis not present

## 2011-12-03 ENCOUNTER — Ambulatory Visit (INDEPENDENT_AMBULATORY_CARE_PROVIDER_SITE_OTHER): Payer: Medicare Other

## 2011-12-03 DIAGNOSIS — J9801 Acute bronchospasm: Secondary | ICD-10-CM

## 2011-12-03 DIAGNOSIS — J449 Chronic obstructive pulmonary disease, unspecified: Secondary | ICD-10-CM

## 2011-12-03 DIAGNOSIS — R0902 Hypoxemia: Secondary | ICD-10-CM

## 2011-12-11 LAB — BASIC METABOLIC PANEL: BUN: 24 mg/dL — AB (ref 4–21)

## 2011-12-14 ENCOUNTER — Ambulatory Visit (INDEPENDENT_AMBULATORY_CARE_PROVIDER_SITE_OTHER): Payer: Medicare Other | Admitting: Family Medicine

## 2011-12-14 VITALS — BP 156/81 | HR 82 | Temp 98.6°F | Resp 16 | Ht 64.25 in | Wt 171.2 lb

## 2011-12-14 DIAGNOSIS — R0602 Shortness of breath: Secondary | ICD-10-CM

## 2011-12-14 DIAGNOSIS — I10 Essential (primary) hypertension: Secondary | ICD-10-CM

## 2011-12-14 DIAGNOSIS — J449 Chronic obstructive pulmonary disease, unspecified: Secondary | ICD-10-CM

## 2011-12-14 DIAGNOSIS — R635 Abnormal weight gain: Secondary | ICD-10-CM | POA: Diagnosis not present

## 2011-12-14 DIAGNOSIS — C649 Malignant neoplasm of unspecified kidney, except renal pelvis: Secondary | ICD-10-CM

## 2011-12-14 LAB — COMPREHENSIVE METABOLIC PANEL
ALT: 22 U/L (ref 0–35)
AST: 21 U/L (ref 0–37)
Albumin: 4.5 g/dL (ref 3.5–5.2)
Alkaline Phosphatase: 101 U/L (ref 39–117)
BUN: 16 mg/dL (ref 6–23)
CO2: 28 mEq/L (ref 19–32)
Calcium: 9.8 mg/dL (ref 8.4–10.5)
Chloride: 103 mEq/L (ref 96–112)
Creat: 1.02 mg/dL (ref 0.50–1.10)
Glucose, Bld: 91 mg/dL (ref 70–99)
Potassium: 4.5 mEq/L (ref 3.5–5.3)
Sodium: 142 mEq/L (ref 135–145)
Total Bilirubin: 0.4 mg/dL (ref 0.3–1.2)
Total Protein: 7 g/dL (ref 6.0–8.3)

## 2011-12-14 LAB — T4, FREE: Free T4: 1.31 ng/dL (ref 0.80–1.80)

## 2011-12-14 LAB — POCT SEDIMENTATION RATE: POCT SED RATE: 23 mm/hr — AB (ref 0–22)

## 2011-12-14 LAB — TSH: TSH: 1.583 u[IU]/mL (ref 0.350–4.500)

## 2011-12-14 MED ORDER — BENAZEPRIL HCL 20 MG PO TABS: 20.0000 mg | ORAL_TABLET | Freq: Every day | ORAL | Status: DC

## 2011-12-14 NOTE — Progress Notes (Signed)
71 yo woman who has undergone renal cell ca excision Left side, followup in Dec with Dr. Patsi Sears was fine.  Since last summer, patient has experienced a significant weight gain.   She recently is recovering from an exacerbation of COPD during which she suffered muscle and joint pain from Levaquin and also depression after taking the prednisone.  At present she is breathing better.  HEENT normal Lungs diffuse ronchi and wheezes Heart is reg w/o murmur Abdomen negative Ext no edema Skin normal  A:  COPD, depression, weight gain, increasing BP  P:  Check labs Resume benazepril

## 2011-12-15 ENCOUNTER — Other Ambulatory Visit: Payer: Self-pay | Admitting: Family Medicine

## 2011-12-15 DIAGNOSIS — R7989 Other specified abnormal findings of blood chemistry: Secondary | ICD-10-CM

## 2011-12-17 ENCOUNTER — Telehealth: Payer: Self-pay

## 2011-12-17 ENCOUNTER — Ambulatory Visit (INDEPENDENT_AMBULATORY_CARE_PROVIDER_SITE_OTHER): Payer: Medicare Other | Admitting: Family Medicine

## 2011-12-17 VITALS — BP 130/60 | HR 72 | Temp 98.1°F | Resp 16

## 2011-12-17 DIAGNOSIS — I10 Essential (primary) hypertension: Secondary | ICD-10-CM | POA: Insufficient documentation

## 2011-12-17 DIAGNOSIS — F339 Major depressive disorder, recurrent, unspecified: Secondary | ICD-10-CM

## 2011-12-17 DIAGNOSIS — J449 Chronic obstructive pulmonary disease, unspecified: Secondary | ICD-10-CM

## 2011-12-17 DIAGNOSIS — R062 Wheezing: Secondary | ICD-10-CM | POA: Diagnosis not present

## 2011-12-17 DIAGNOSIS — H9190 Unspecified hearing loss, unspecified ear: Secondary | ICD-10-CM | POA: Insufficient documentation

## 2011-12-17 DIAGNOSIS — C649 Malignant neoplasm of unspecified kidney, except renal pelvis: Secondary | ICD-10-CM | POA: Insufficient documentation

## 2011-12-17 DIAGNOSIS — F32A Depression, unspecified: Secondary | ICD-10-CM

## 2011-12-17 DIAGNOSIS — F329 Major depressive disorder, single episode, unspecified: Secondary | ICD-10-CM

## 2011-12-17 DIAGNOSIS — M5432 Sciatica, left side: Secondary | ICD-10-CM | POA: Insufficient documentation

## 2011-12-17 LAB — BRAIN NATRIURETIC PEPTIDE

## 2011-12-17 MED ORDER — FLUOXETINE HCL 20 MG PO TABS: 20.0000 mg | ORAL_TABLET | Freq: Every day | ORAL | Status: DC

## 2011-12-17 MED ORDER — METHYLPREDNISOLONE 4 MG PO TABS: 8.0000 mg | ORAL_TABLET | Freq: Every day | ORAL | Status: AC

## 2011-12-17 NOTE — Progress Notes (Signed)
Spell is a 71 year old woman who has recently had a severe cough shortness of breath and wheezing. She comes in for followup after one week of Alupent Advair and albuterol treatments. Earlier in January she had been diagnosed with pneumonia and was given Levaquin. She developed severe muscle pains from that.  Today her breathing is better and she continues on her medications. At the same time, she is a new problem of low back pain. She's had this back pain before and it the pain radiates down her left leg. She is in no urinary or bowel problems, no weakness, no numbness. She has no chest pain and her breathing is better.  Objective: Tammy Ball is in no acute distress and is alert and cooperative. Skin is dry. Nailbeds are pink. Chest is clear, cardiac exam is normal. Extremities show no edema. She does have a slight positive straight leg raising on the left. She has no muscle weakness or numbness. I spent one half hour with the patient reviewing her labs from the last visit including a slightly elevated BUN. I explained that the BNP test is still pending.  Assessment: Improving lung function. New-onset sciatica.  Plan: Medrol as ordered, continue inhalers, followup February 14 with additional CM eT.

## 2011-12-17 NOTE — Patient Instructions (Signed)

## 2011-12-17 NOTE — Telephone Encounter (Signed)
Advised patient the BNP was not done at [facility].  Per Dr. Milus Glazier we will redraw at next visit.

## 2011-12-25 DIAGNOSIS — Z1231 Encounter for screening mammogram for malignant neoplasm of breast: Secondary | ICD-10-CM | POA: Diagnosis not present

## 2011-12-26 ENCOUNTER — Encounter: Payer: Self-pay | Admitting: Family Medicine

## 2011-12-27 ENCOUNTER — Encounter: Payer: Self-pay | Admitting: Family Medicine

## 2011-12-27 ENCOUNTER — Ambulatory Visit (INDEPENDENT_AMBULATORY_CARE_PROVIDER_SITE_OTHER): Payer: Medicare Other | Admitting: Family Medicine

## 2011-12-27 VITALS — BP 143/76 | HR 89 | Temp 98.6°F | Resp 16 | Ht 64.5 in | Wt 171.0 lb

## 2011-12-27 DIAGNOSIS — F4321 Adjustment disorder with depressed mood: Secondary | ICD-10-CM

## 2011-12-27 DIAGNOSIS — J159 Unspecified bacterial pneumonia: Secondary | ICD-10-CM

## 2011-12-27 DIAGNOSIS — R05 Cough: Secondary | ICD-10-CM

## 2011-12-27 DIAGNOSIS — R0609 Other forms of dyspnea: Secondary | ICD-10-CM | POA: Diagnosis not present

## 2011-12-27 DIAGNOSIS — R059 Cough, unspecified: Secondary | ICD-10-CM | POA: Diagnosis not present

## 2011-12-27 DIAGNOSIS — R0989 Other specified symptoms and signs involving the circulatory and respiratory systems: Secondary | ICD-10-CM | POA: Diagnosis not present

## 2011-12-27 DIAGNOSIS — R0600 Dyspnea, unspecified: Secondary | ICD-10-CM

## 2011-12-27 NOTE — Progress Notes (Signed)
Ms. Sulser continues to improve with respect breathing and depression.  Still has a cough.  Some sleep problems remain as well as mild depression.  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 Her husband is deceased, her son works in the same rehab office as she.  She has 4 great grandchildren  O:  Oroph normal (edent) Heart is regular without murmur Chest:  Few wheezes right lower lung field NAD, alert, cooperative and very friendly 35 minutes face to face  A: improving depression and pneumonia.  Hope to see more improvement over the next month  P: check BNP as originally planned Follow up in 6 weeks.  If clear, we will d/c the Advair I would like to continue the Prozac for at least 6 months

## 2011-12-28 LAB — BRAIN NATRIURETIC PEPTIDE: Brain Natriuretic Peptide: 16.7 pg/mL (ref 0.0–100.0)

## 2012-02-07 ENCOUNTER — Ambulatory Visit (INDEPENDENT_AMBULATORY_CARE_PROVIDER_SITE_OTHER): Payer: No Typology Code available for payment source | Admitting: Family Medicine

## 2012-02-07 ENCOUNTER — Encounter: Payer: Self-pay | Admitting: Family Medicine

## 2012-02-07 VITALS — BP 129/77 | HR 87 | Temp 98.1°F | Resp 16 | Ht 64.0 in | Wt 169.8 lb

## 2012-02-07 DIAGNOSIS — J40 Bronchitis, not specified as acute or chronic: Secondary | ICD-10-CM

## 2012-02-07 DIAGNOSIS — J209 Acute bronchitis, unspecified: Secondary | ICD-10-CM

## 2012-02-07 DIAGNOSIS — F329 Major depressive disorder, single episode, unspecified: Secondary | ICD-10-CM

## 2012-02-07 DIAGNOSIS — F32A Depression, unspecified: Secondary | ICD-10-CM

## 2012-02-07 DIAGNOSIS — M549 Dorsalgia, unspecified: Secondary | ICD-10-CM

## 2012-02-07 DIAGNOSIS — M545 Low back pain, unspecified: Secondary | ICD-10-CM

## 2012-02-07 DIAGNOSIS — Z0000 Encounter for general adult medical examination without abnormal findings: Secondary | ICD-10-CM

## 2012-02-07 MED ORDER — HYDROCODONE-HOMATROPINE 5-1.5 MG/5ML PO SYRP: 5.0000 mL | ORAL_SOLUTION | Freq: Three times a day (TID) | ORAL | Status: AC | PRN

## 2012-02-07 MED ORDER — METHYLPREDNISOLONE 4 MG PO TABS: ORAL_TABLET | ORAL | Status: DC

## 2012-02-07 MED ORDER — AMOXICILLIN 875 MG PO TABS: 875.0000 mg | ORAL_TABLET | Freq: Two times a day (BID) | ORAL | Status: AC

## 2012-02-07 NOTE — Progress Notes (Signed)
71 yo woman with productive cough for 1 week, starting with a fever.  Also has sinus congestion Doing better emotionally on the Prozac: no crying spells Notes persisting low back pain x 1 year (notes by Dr. Patsy Lager)  O:  NAD, alert HEENT:  Ears normal, oropharynx: red with PND,  Neck:  Supple with no significant adenopathy Chest:  Diffuse ronchi without rales, very congested cough Heart:  Regular, no murmur heard Ext:  No ankle edema  A:  Bronchitis, acute. Chronic back pain Depression which has improved.  P:  Amoxicillin, hydromet Medrol contine prozac

## 2012-02-28 DIAGNOSIS — R635 Abnormal weight gain: Secondary | ICD-10-CM | POA: Diagnosis not present

## 2012-02-28 DIAGNOSIS — Z1211 Encounter for screening for malignant neoplasm of colon: Secondary | ICD-10-CM | POA: Diagnosis not present

## 2012-02-28 DIAGNOSIS — E669 Obesity, unspecified: Secondary | ICD-10-CM | POA: Diagnosis not present

## 2012-03-09 ENCOUNTER — Ambulatory Visit: Payer: Medicare Other

## 2012-03-09 ENCOUNTER — Ambulatory Visit (INDEPENDENT_AMBULATORY_CARE_PROVIDER_SITE_OTHER): Payer: Medicare Other | Admitting: Family Medicine

## 2012-03-09 VITALS — BP 146/71 | HR 91 | Temp 98.8°F | Resp 18 | Ht 64.0 in | Wt 177.0 lb

## 2012-03-09 DIAGNOSIS — M79602 Pain in left arm: Secondary | ICD-10-CM

## 2012-03-09 DIAGNOSIS — M25532 Pain in left wrist: Secondary | ICD-10-CM

## 2012-03-09 DIAGNOSIS — M79609 Pain in unspecified limb: Secondary | ICD-10-CM | POA: Diagnosis not present

## 2012-03-09 DIAGNOSIS — R202 Paresthesia of skin: Secondary | ICD-10-CM

## 2012-03-09 DIAGNOSIS — M79642 Pain in left hand: Secondary | ICD-10-CM

## 2012-03-09 DIAGNOSIS — R200 Anesthesia of skin: Secondary | ICD-10-CM

## 2012-03-09 DIAGNOSIS — M25539 Pain in unspecified wrist: Secondary | ICD-10-CM

## 2012-03-09 DIAGNOSIS — R209 Unspecified disturbances of skin sensation: Secondary | ICD-10-CM | POA: Diagnosis not present

## 2012-03-09 MED ORDER — TRAMADOL HCL 50 MG PO TABS: 50.0000 mg | ORAL_TABLET | Freq: Three times a day (TID) | ORAL | Status: AC | PRN

## 2012-03-09 NOTE — Progress Notes (Signed)
[facility]:  Office Visit  Chief Complaint:  Chief Complaint  Patient presents with  . Hand Pain    left, tripped and fell    HPI: Tammy Ball is a 71 y.o. female who complains of  Left arm and hand, wrist  pain s/p tripping  over hose with outstretched hands this morning. Mild numbness and tingling on dorsal aspect of radial side of hand, sharp pain with 10/10 with movement. But primarily 8/10 pain.She has iced it with minimal relief. + swelling, + color change She was on low dose steroids for bronchitis but no long term steroid use and there is no history of osteoporosis  Past Medical History  Diagnosis Date  . COPD (chronic obstructive pulmonary disease)   . History of kidney cancer   . Hypertension    Past Surgical History  Procedure Date  . Nephrectomy   . Kidney cancer   . Tubal ligation   . Spine surgery    History   Social History  . Marital Status: Widowed    Spouse Name: N/A    Number of Children: N/A  . Years of Education: N/A   Social History Main Topics  . Smoking status: Former Smoker    Types: Cigarettes    Quit date: 03/16/2011  . Smokeless tobacco: None  . Alcohol Use: No  . Drug Use: No  . Sexually Active: Not Currently   Other Topics Concern  . None   Social History Narrative   ** Merged History Encounter ** ** Data from: 12/14/11 Enc Dept: [facility] ** Data from: 12/17/11 Enc Dept: [facility] CARSubstance abuse counselorHusband deceased4 great grandchildrenSon works in same substance abuse counseling center as patient   No family history on file. Allergies  Allergen Reactions  . Levaquin     myalgia  . Levaquin     Dizzy and joint pains   Prior to Admission medications   Medication Sig Start Date End Date Taking? Authorizing Provider  albuterol (PROVENTIL HFA;VENTOLIN HFA) 108 (90 BASE) MCG/ACT inhaler Inhale 2 puffs into the lungs every 4 (four) hours as needed for wheezing or shortness of breath.  11/16/11 11/15/12 Yes Arman Filter, NP  amLODipine (NORVASC) 5 MG tablet Take 5 mg by mouth daily.   Yes Historical Provider, MD  aspirin 81 MG tablet Take 160 mg by mouth daily.   Yes Historical Provider, MD  atorvastatin (LIPITOR) 40 MG tablet Take 40 mg by mouth daily.     Yes Historical Provider, MD  benazepril (LOTENSIN) 20 MG tablet Take 1 tablet (20 mg total) by mouth daily. 12/14/11 12/13/12 Yes [name], MD  fish oil-omega-3 fatty acids 1000 MG capsule Take 1 g by mouth daily.     Yes Historical Provider, MD  Fluticasone-Salmeterol (ADVAIR) 250-50 MCG/DOSE AEPB Inhale 1 puff into the lungs every 12 (twelve) hours.   Yes Historical Provider, MD  FLUoxetine (PROZAC) 20 MG tablet Take 1 tablet (20 mg total) by mouth daily. 12/17/11 03/16/12  [name], MD  methylPREDNISolone (MEDROL) 4 MG tablet 2 daily 02/07/12   [name], MD     ROS: The patient denies fevers, chills, night sweats, unintentional weight loss, chest pain, palpitations, wheezing, dyspnea on exertion, nausea, vomiting, abdominal pain, dysuria, hematuria, melena,  weakness, + pain and mild numbness and tingling of hand.  All other systems have been reviewed and were otherwise negative with the exception of those mentioned in the HPI and as above.  PHYSICAL EXAM: Filed Vitals:   03/09/12 1214  BP: 146/71  Pulse: 91  Temp: 98.8 F (37.1 C)  Resp: 18   Filed Vitals:   03/09/12 1214  Height: 5\' 4"  (1.626 m)  Weight: 177 lb (80.287 kg)   Body mass index is 30.38 kg/(m^2).  General: Alert, no acute distress HEENT:  Normocephalic, atraumatic, oropharynx patent.  Cardiovascular:  Regular rate and rhythm, no rubs murmurs or gallops.  No Carotid bruits, radial pulse intact. No pedal edema.  Respiratory: Clear to auscultation bilaterally.  No wheezes, rales, or rhonchi.  No cyanosis, no use of accessory musculature GI: No organomegaly, abdomen is soft and non-tender, positive bowel sounds.  No masses. Skin: No  rashes. Neurologic: Facial musculature symmetric. Psychiatric: Patient is appropriate throughout our interaction. Lymphatic: No cervical lymphadenopathy Musculoskeletal: Gait intact. Left elbow-nontender, full ROM Left forearm-tender at distal radius, + radial pulse, + swelling at radial aspect of wrist and forearm Left wrist-decrease ROM due to pain.  Sensation intact Hand is warm, good cap refill   LABS: Results for orders placed in visit on 12/27/11  BRAIN NATRIURETIC PEPTIDE      Component Value Range   Brain Natriuretic Peptide 16.7  0.0 - 100.0 (pg/mL)     EKG/XRAY:   Primary read interpreted by Dr. Conley Rolls at [facility]. Closed, nondisplaced distal radius fracture   ASSESSMENT/PLAN: Encounter Diagnoses  Name Primary?  . Wrist pain, left Yes  . Hand pain, left   . Arm pain, left   . Numbness and tingling     Left nondisplaced distal radius fx 1. Sugar tong splint 2. Tramadol prn pain ( patient has had issue with alcohol abuse, if she needs more pain cotnrol with vicodin and or percocet then I will rx otherwise we will keep her on tramadol for now) 3. Ortho referral 03/10/12.    Tammy Shewell PHUONG, DO 03/09/2012 1:11 PM

## 2012-03-10 ENCOUNTER — Telehealth: Payer: Self-pay

## 2012-03-10 DIAGNOSIS — M25539 Pain in unspecified wrist: Secondary | ICD-10-CM | POA: Diagnosis not present

## 2012-03-10 DIAGNOSIS — S52599A Other fractures of lower end of unspecified radius, initial encounter for closed fracture: Secondary | ICD-10-CM | POA: Diagnosis not present

## 2012-03-10 MED ORDER — HYDROCODONE-ACETAMINOPHEN 5-325 MG PO TABS: 1.0000 | ORAL_TABLET | Freq: Four times a day (QID) | ORAL | Status: DC | PRN

## 2012-03-10 NOTE — Telephone Encounter (Signed)
Done

## 2012-03-10 NOTE — Telephone Encounter (Signed)
Pt is calling for more pain medication , states she has an appt today with [facility] at [phone]   [facility] [address]  Best number [phone]

## 2012-03-10 NOTE — Telephone Encounter (Signed)
Rx faxed in and patient notified. 

## 2012-03-10 NOTE — Telephone Encounter (Signed)
Pt  States pain meds not strong enough,seen yesterday with fx arm   Best phone [phone]   Pharmacy [facility]  [address]

## 2012-03-10 NOTE — Telephone Encounter (Signed)
Can we give something stronger?

## 2012-03-20 DIAGNOSIS — M25549 Pain in joints of unspecified hand: Secondary | ICD-10-CM | POA: Diagnosis not present

## 2012-03-27 DIAGNOSIS — M25549 Pain in joints of unspecified hand: Secondary | ICD-10-CM | POA: Diagnosis not present

## 2012-04-02 ENCOUNTER — Telehealth: Payer: Self-pay

## 2012-04-02 NOTE — Telephone Encounter (Signed)
PT NO LONGER USING [facility]IL ORDER [facility],REQUESTING MEDS BE CALLED TO [facility] [address]. MEDS ARE AMOLIPINE,[email or web address] REQUESTING 90 DAY SUPPLY.   CA;LL BACK [phone]   PHARMACY [facility] [address]

## 2012-04-03 MED ORDER — ATORVASTATIN CALCIUM 40 MG PO TABS: 40.0000 mg | ORAL_TABLET | Freq: Every day | ORAL | Status: DC

## 2012-04-03 MED ORDER — AMLODIPINE BESYLATE 5 MG PO TABS: 5.0000 mg | ORAL_TABLET | Freq: Every day | ORAL | Status: DC

## 2012-04-03 NOTE — Telephone Encounter (Signed)
Amlodipine 5mg  QD Lipitor 40mg  QD Ok to refill?

## 2012-04-03 NOTE — Telephone Encounter (Signed)
LMOM of info 

## 2012-04-03 NOTE — Telephone Encounter (Signed)
Please advise patient that a 90-day supply of lipitor and norvasc have been sent, and that she'll need an office visit and fasting labs for additional refills.

## 2012-04-17 DIAGNOSIS — M25549 Pain in joints of unspecified hand: Secondary | ICD-10-CM | POA: Diagnosis not present

## 2012-04-17 DIAGNOSIS — M25539 Pain in unspecified wrist: Secondary | ICD-10-CM | POA: Diagnosis not present

## 2012-05-09 DIAGNOSIS — D126 Benign neoplasm of colon, unspecified: Secondary | ICD-10-CM | POA: Diagnosis not present

## 2012-05-09 DIAGNOSIS — Z1211 Encounter for screening for malignant neoplasm of colon: Secondary | ICD-10-CM | POA: Diagnosis not present

## 2012-07-06 ENCOUNTER — Other Ambulatory Visit: Payer: Self-pay | Admitting: Physician Assistant

## 2012-07-07 NOTE — Telephone Encounter (Signed)
Needs office visit.

## 2012-09-04 DIAGNOSIS — H40019 Open angle with borderline findings, low risk, unspecified eye: Secondary | ICD-10-CM | POA: Diagnosis not present

## 2012-09-11 ENCOUNTER — Ambulatory Visit (INDEPENDENT_AMBULATORY_CARE_PROVIDER_SITE_OTHER): Payer: Medicare Other | Admitting: Family Medicine

## 2012-09-11 ENCOUNTER — Ambulatory Visit: Payer: Medicare Other

## 2012-09-11 VITALS — BP 152/88 | HR 102 | Temp 98.9°F | Resp 17 | Ht 64.5 in | Wt 178.0 lb

## 2012-09-11 DIAGNOSIS — M545 Low back pain, unspecified: Secondary | ICD-10-CM

## 2012-09-11 DIAGNOSIS — M79605 Pain in left leg: Secondary | ICD-10-CM

## 2012-09-11 MED ORDER — PREDNISONE 20 MG PO TABS: ORAL_TABLET | ORAL | Status: DC

## 2012-09-11 MED ORDER — HYDROCODONE-ACETAMINOPHEN 5-500 MG PO TABS: 1.0000 | ORAL_TABLET | Freq: Four times a day (QID) | ORAL | Status: DC | PRN

## 2012-09-11 NOTE — Patient Instructions (Addendum)
1. Low back pain radiating to left leg  DG Lumbar Spine Complete, DG Hip Complete Left   Piriformis Syndrome with Rehab Piriformis syndrome is a condition the affects the nervous system in the area of the hip, and is characterized by pain and possibly a loss of feeling in the backside (posterior) thigh that may extend down the entire length of the leg. The symptoms are caused by an increase in pressure on the sciatic nerve by the piriformis muscle, which is on the back of the hip and is responsible for externally rotating the hip. The sciatic nerve and its branches connect to much of the leg. Normally the sciatic nerve runs between the piriformis muscle and other muscles. However, in certain individuals the nerve runs through the muscle, which causes an increase in pressure on the nerve and results in the symptoms of piriformis syndrome. SYMPTOMS   Pain, tingling, numbness, or burning in the back of the thigh that may also extend down the entire leg.  Occasionally, tenderness in the buttock.  Loss of function of the leg.  Pain that worsens when using the piriformis muscle (running, jumping, or stairs).  Pain that increases with prolonged sitting.  Pain that is lessened by laying flat on the back. CAUSES   Piriformis syndrome is the result of an increase in pressure placed on the sciatic nerve. Often times piriformis syndrome is an overuse injury.  Stress placed on the nerve from a sudden increase in the intensity, frequency, or duration of training.  Compensation of other extremity injuries. RISK INCREASES WITH:  Sports that involve the piriformis muscle (running, walking or jumping).  You are born with (congenital) a defect in which the sciatic nerve passes through the muscle. PREVENTION  Warm up and stretch properly before activity.  Allow for adequate recovery between workouts.  Maintain physical fitness:  Strength, flexibility, and endurance.  Cardiovascular  fitness. PROGNOSIS  If treated properly, then the symptoms of piriformis syndrome usually resolve in 2 to 6 weeks. RELATED COMPLICATIONS   Persistent and possibly permanent pain and numbness in the lower extremity.  Weakness of the extremity that may progress to disability and inability to compete. TREATMENT  The most effective treatment for piriformis syndrome is rest from any activities that aggravate the symptoms. Ice and pain medication may help reduce pain and inflammation. The use of strengthening and stretching exercises may help reduce pain with activity. These exercises may be performed at home or with a therapist. A referral to a therapist may be given for further evaluation and treatment, such as ultrasound. Corticosteroid injections may be given to reduce inflammation that is causing pressure to be placed on the sciatic nerve. If non-surgical (conservative) treatment is unsuccessful, then surgery may be recommended.  MEDICATION   If pain medication is necessary, then nonsteroidal anti-inflammatory medications, such as aspirin and ibuprofen, or other minor pain relievers, such as acetaminophen, are often recommended.  Do not take pain medication for 7 days before surgery.  Prescription pain relievers may be given if deemed necessary by your caregiver. Use only as directed and only as much as you need.  Corticosteroid injections may be given by your caregiver. These injections should be reserved for the most serious cases, because they may only be given a certain number of times. HEAT AND COLD:   Cold treatment (icing) relieves pain and reduces inflammation. Cold treatment should be applied for 10 to 15 minutes every 2 to 3 hours for inflammation and pain and immediately after any  activity that aggravates your symptoms. Use ice packs or massage the area with a piece of ice (ice massage).  Heat treatment may be used prior to performing the stretching and strengthening activities  prescribed by your caregiver, physical therapist, or athletic trainer. Use a heat pack or soak the injury in warm water. SEEK IMMEDIATE MEDICAL CARE IF:  Treatment seems to offer no benefit, or the condition worsens.  Any medications produce adverse side effects. EXERCISES RANGE OF MOTION (ROM) AND STRETCHING EXERCISES - Piriformis Syndrome These exercises may help you when beginning to rehabilitate your injury. Your symptoms may resolve with or without further involvement from your physician, physical therapist or athletic trainer. While completing these exercises, remember:   Restoring tissue flexibility helps normal motion to return to the joints. This allows healthier, less painful movement and activity.  An effective stretch should be held for at least 30 seconds.  A stretch should never be painful. You should only feel a gentle lengthening or release in the stretched tissue. STRETCH - Hip Rotators  Lie on your back on a firm surface. Grasp your right / left knee with your right / left hand and your ankle with your opposite hand.  Keeping your hips and shoulders firmly planted, gently pull your right / left knee and rotate your lower leg toward your opposite shoulder until you feel a stretch in your buttocks.  Hold this stretch for __________ seconds. Repeat this stretch __________ times. Complete this stretch __________ times per day. STRETCH  Iliotibial Band  On the floor or bed, lie on your side so your right / left leg is on top. Bend your knee and grab your ankle.  Slowly bring your knee back so that your thigh is in line with your trunk. Keep your heel at your buttocks and gently arch your back so your head, shoulders and hips line up.  Slowly lower your leg so that your knee approaches the floor/bed until you feel a gentle stretch on the outside of your right / left thigh. If you do not feel a stretch and your knee will not fall farther, place the heel of your opposite foot  on top of your knee and pull your thigh down farther.  Hold this stretch for __________ seconds. Repeat __________ times. Complete __________ times per day. STRENGTHENING EXERCISES - Piriformis Syndrome  These are some of the caregiver again or until your symptoms are resolved. Remember:   Strong muscles with good endurance tolerate stress better.  Do the exercises as initially prescribed by your caregiver. Progress slowly with each exercise, gradually increasing the number of repetitions and weight used under their guidance. STRENGTH - Hip Abductors, Straight Leg Raises Be aware of your form throughout the entire exercise so that you exercise the correct muscles. Sloppy form means that you are not strengthening the correct muscles.  Lie on your side so that your head, shoulders, knee and hip line up. You may bend your lower knee to help maintain your balance. Your right / left leg should be on top.  Roll your hips slightly forward, so that your hips are stacked directly over each other and your right / left knee is facing forward.  Lift your top leg up 4-6 inches, leading with your heel. Be sure that your foot does not drift forward or that your knee does not roll toward the ceiling.  Hold this position for __________ seconds. You should feel the muscles in your outer hip lifting (you may not notice  this until your leg begins to tire).  Slowly lower your leg to the starting position. Allow the muscles to fully relax before beginning the next repetition. Repeat __________ times. Complete this exercise __________ times per day.  STRENGTH - Hip Abductors, Quadriped  On a firm, lightly padded surface, position yourself on your hands and knees. Your hands should be directly below your shoulders and your knees should be directly below your hips.  Keeping your right / left knee bent, lift your leg out to the side. Keep your legs level and in line with your shoulders.  Position yourself on your  hands and knees.  Hold for __________ seconds.  Keeping your trunk steady and your hips level, slowly lower your leg to the starting position. Repeat __________ times. Complete this exercise __________ times per day.  STRENGTH - Hip Abductors, Standing  Tie one end of a rubber exercise band/tubing to a secure surface (table, pole) and tie a loop at the other end.  Place the loop around your right / left ankle. Keeping your ankle with the band directly opposite of the secured end, step away until there is tension in the tube/band.  Hold onto a chair as needed for balance.  Keeping your back upright, your shoulders over your hips, and your toes pointing forward, lift your right / left leg out to your side. Be sure to lift your leg with your hip muscles. Do not "throw" your leg or tip your body to lift your leg.  Slowly and with control, return to the starting position. Repeat exercise __________ times. Complete this exercise __________ times per day.  Document Released: 10/29/2005 Document Revised: 04/29/2012 Document Reviewed: 02/10/2009 [facility] Patient Information 2013 [place], [place].

## 2012-09-11 NOTE — Progress Notes (Signed)
[address]   [place], [place]  [address]   [phone]  Subjective:    Patient ID: Tammy Ball, female    DOB: [birth year], 71 y.o.   MRN: [id number]  HPI This 71 y.o. female presents for evaluation of the following:  1.  L hip pain: onset yesterday.  Took Excedrin Arthritis without improvement; took more last night without improvement.  Took hot shower without relief; applied ice without improvement.  L lower back pain and radiates into thigh; no radiation past knee.  Severity 8-9/10.  Did not sleep well last night.  No n/t/w.  Normal b/b function; has been mildly constipated.  No saddle paresthesias.  History of sciatica; last episode 2011.  This is worst episode. Hurts to sit down, stand up.  Sitting in car is the worst.     Review of Systems  Constitutional: Negative for fever, chills, diaphoresis and fatigue.  Genitourinary: Negative for decreased urine volume.  Musculoskeletal: Positive for back pain, arthralgias and gait problem. Negative for joint swelling.  Skin: Negative for rash.  Neurological: Negative for weakness and numbness.        Past Medical History  Diagnosis Date  . COPD (chronic obstructive pulmonary disease)   . History of kidney cancer   . Hypertension   . Cancer   . Depression     Past Surgical History  Procedure Date  . Nephrectomy   . Kidney cancer   . Tubal ligation   . Spine surgery     Prior to Admission medications   Medication Sig Start Date End Date Taking? Authorizing Provider  albuterol (PROVENTIL HFA;VENTOLIN HFA) 108 (90 BASE) MCG/ACT inhaler Inhale 2 puffs into the lungs every 4 (four) hours as needed for wheezing or shortness of breath. 11/16/11 11/15/12 Yes Arman Filter, NP  amLODipine (NORVASC) 5 MG tablet TAKE 1 TABLET BY MOUTH DAILY 07/06/12  Yes Ryan M Dunn, PA-C  aspirin 81 MG tablet Take 160 mg by mouth daily.   Yes Historical Provider, MD  atorvastatin (LIPITOR) 40 MG tablet TAKE 1 TABLET BY MOUTH DAILY 07/06/12  Yes Ryan M  Dunn, PA-C  benazepril (LOTENSIN) 20 MG tablet Take 1 tablet (20 mg total) by mouth daily. 12/14/11 12/13/12 Yes [name], MD  fish oil-omega-3 fatty acids 1000 MG capsule Take 1 g by mouth daily.     Yes Historical Provider, MD  Fluticasone-Salmeterol (ADVAIR) 250-50 MCG/DOSE AEPB Inhale 1 puff into the lungs every 12 (twelve) hours.   Yes Historical Provider, MD  FLUoxetine (PROZAC) 20 MG tablet Take 1 tablet (20 mg total) by mouth daily. 12/17/11 03/16/12  [name], MD  methylPREDNISolone (MEDROL) 4 MG tablet 2 daily 02/07/12   [name], MD    Allergies  Allergen Reactions  . Levofloxacin     myalgia  . Levofloxacin     Dizzy and joint pains    History   Social History  . Marital Status: Widowed    Spouse Name: N/A    Number of Children: N/A  . Years of Education: N/A   Occupational History  . Not on file.   Social History Main Topics  . Smoking status: Former Smoker    Types: Cigarettes    Quit date: 03/16/2011  . Smokeless tobacco: Not on file  . Alcohol Use: No  . Drug Use: No  . Sexually Active: Not Currently   Other Topics Concern  . 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 Heart disease Sister   . Obesity Brother   . Heart disease Daughter   . Glaucoma Daughter   . Breast cancer Sister     Objective:   Physical Exam  Nursing note and vitals reviewed. Constitutional: She appears well-developed and well-nourished. No distress.  Cardiovascular: Normal rate, regular rhythm, normal heart sounds and intact distal pulses.  Exam reveals no gallop and no friction rub.     No murmur heard. Pulmonary/Chest: Effort normal and breath sounds normal. She has no wheezes. She has no rales.  Musculoskeletal:       Left hip: She exhibits tenderness. She exhibits normal range of motion, normal strength and no bony tenderness.       Lumbar back: She exhibits decreased range of motion, tenderness and pain. She exhibits no bony tenderness, no swelling, no spasm and normal pulse.       LUMBAR SPINE:  +TTP L SI REGION; STRAIGHT LEG RAISES NEGATIVE; MOTOR 5/5 BLE.   L HIP: +TTP LATERAL HIP; +NORMAL EXTERNAL AND INTERNAL ROTATION; PAIN WITH ROM; MOTOR 5/5 L HIP.  Neurological: She has normal reflexes. She exhibits normal muscle tone. Coordination normal.  Skin: Skin is warm and dry. No rash noted. She is not diaphoretic.  Psychiatric: She has a normal mood and affect. Her behavior is normal. Judgment and thought content normal.    [facility] reading (PRIMARY) by  Dr. Katrinka Blazing.  LS Spine: DDD changes; no acute.   Hip film: NAD.      Assessment & Plan:   1. Low back pain radiating to left leg  DG Lumbar Spine Complete, DG Hip Complete Left    1. Low back pain with radiculopathy:  New.  Rx for Prednisone and hydrocodone provided.    Recommend frequent ambulation; home exercises also encouraged.  Avoid repetitive bending, twisting, rotating, or lifting.  If no improvement in 1-2 weeks, to call for ortho referral.     Meds ordered this encounter  Medications  . predniSONE (DELTASONE) 20 MG tablet    Sig: Two tablets daily x 5 days, then one tablet daily x 5 days    Dispense:  15 tablet    Refill:  0  . HYDROcodone-acetaminophen (VICODIN) 5-500 MG per tablet    Sig: Take 1 tablet by mouth every 6 (six) hours as needed for pain.    Dispense:  40 tablet    Refill:  0

## 2012-09-23 DIAGNOSIS — Z23 Encounter for immunization: Secondary | ICD-10-CM | POA: Diagnosis not present

## 2012-09-24 ENCOUNTER — Telehealth: Payer: Self-pay

## 2012-09-24 MED ORDER — AMLODIPINE BESYLATE 5 MG PO TABS: 5.0000 mg | ORAL_TABLET | Freq: Every day | ORAL | Status: DC

## 2012-09-24 MED ORDER — ATORVASTATIN CALCIUM 40 MG PO TABS: 40.0000 mg | ORAL_TABLET | Freq: Every day | ORAL | Status: DC

## 2012-09-24 NOTE — Telephone Encounter (Signed)
Medications refilled for one month.

## 2012-09-24 NOTE — Telephone Encounter (Signed)
Pt out of generic cholesterol meds and 1 of her BP meds (amlodipine, she thinks).  I have made her the first available appt with Dr. Elbert Ewings for Dec 12.  Can we call in one month supply of meds to carry her over? [facility] [address]  [phone]

## 2012-09-24 NOTE — Telephone Encounter (Signed)
LMOM that we sent in her RFs.

## 2012-10-08 DIAGNOSIS — H40019 Open angle with borderline findings, low risk, unspecified eye: Secondary | ICD-10-CM | POA: Diagnosis not present

## 2012-10-18 NOTE — Progress Notes (Signed)
Reviewed and agree.

## 2012-10-21 ENCOUNTER — Ambulatory Visit (HOSPITAL_COMMUNITY)
Admission: RE | Admit: 2012-10-21 | Discharge: 2012-10-21 | Disposition: A | Discharge status: Home / Self Care | Payer: Medicare Other | Source: Ambulatory Visit | Attending: Urology | Admitting: Urology

## 2012-10-21 ENCOUNTER — Other Ambulatory Visit (HOSPITAL_COMMUNITY): Payer: Self-pay | Admitting: Urology

## 2012-10-21 DIAGNOSIS — C649 Malignant neoplasm of unspecified kidney, except renal pelvis: Secondary | ICD-10-CM | POA: Diagnosis not present

## 2012-10-21 DIAGNOSIS — S298XXA Other specified injuries of thorax, initial encounter: Secondary | ICD-10-CM | POA: Diagnosis not present

## 2012-10-23 ENCOUNTER — Other Ambulatory Visit: Payer: Self-pay | Admitting: Emergency Medicine

## 2012-10-23 ENCOUNTER — Ambulatory Visit (INDEPENDENT_AMBULATORY_CARE_PROVIDER_SITE_OTHER): Payer: Medicare Other | Admitting: Family Medicine

## 2012-10-23 ENCOUNTER — Encounter: Payer: Self-pay | Admitting: Family Medicine

## 2012-10-23 VITALS — BP 120/72 | HR 80 | Temp 98.8°F | Resp 16 | Ht 64.0 in | Wt 176.0 lb

## 2012-10-23 DIAGNOSIS — I10 Essential (primary) hypertension: Secondary | ICD-10-CM

## 2012-10-23 DIAGNOSIS — E663 Overweight: Secondary | ICD-10-CM

## 2012-10-23 DIAGNOSIS — E785 Hyperlipidemia, unspecified: Secondary | ICD-10-CM | POA: Diagnosis not present

## 2012-10-23 DIAGNOSIS — J449 Chronic obstructive pulmonary disease, unspecified: Secondary | ICD-10-CM | POA: Diagnosis not present

## 2012-10-23 DIAGNOSIS — J45909 Unspecified asthma, uncomplicated: Secondary | ICD-10-CM

## 2012-10-23 DIAGNOSIS — Z23 Encounter for immunization: Secondary | ICD-10-CM

## 2012-10-23 DIAGNOSIS — H409 Unspecified glaucoma: Secondary | ICD-10-CM

## 2012-10-23 LAB — CBC
HCT: 40.2 % (ref 36.0–46.0)
Hemoglobin: 13.9 g/dL (ref 12.0–15.0)
MCH: 29.4 pg (ref 26.0–34.0)
MCHC: 34.6 g/dL (ref 30.0–36.0)
MCV: 85 fL (ref 78.0–100.0)
Platelets: 373 10*3/uL (ref 150–400)
RBC: 4.73 MIL/uL (ref 3.87–5.11)
RDW: 16.9 % — ABNORMAL HIGH (ref 11.5–15.5)
WBC: 6.6 10*3/uL (ref 4.0–10.5)

## 2012-10-23 LAB — LIPID PANEL
Cholesterol: 172 mg/dL (ref 0–200)
HDL: 68 mg/dL (ref >39)
LDL Cholesterol: 86 mg/dL (ref 0–99)
Total CHOL/HDL Ratio: 2.5 Ratio
Triglycerides: 90 mg/dL (ref <150)
VLDL: 18 mg/dL (ref 0–40)

## 2012-10-23 LAB — COMPREHENSIVE METABOLIC PANEL
ALT: 13 U/L (ref 0–35)
AST: 16 U/L (ref 0–37)
Albumin: 4.5 g/dL (ref 3.5–5.2)
Alkaline Phosphatase: 91 U/L (ref 39–117)
BUN: 25 mg/dL — ABNORMAL HIGH (ref 6–23)
CO2: 26 mEq/L (ref 19–32)
Calcium: 9.7 mg/dL (ref 8.4–10.5)
Chloride: 105 mEq/L (ref 96–112)
Creat: 1.06 mg/dL (ref 0.50–1.10)
Glucose, Bld: 120 mg/dL — ABNORMAL HIGH (ref 70–99)
Potassium: 5.2 mEq/L (ref 3.5–5.3)
Sodium: 140 mEq/L (ref 135–145)
Total Bilirubin: 0.5 mg/dL (ref 0.3–1.2)
Total Protein: 7.2 g/dL (ref 6.0–8.3)

## 2012-10-23 LAB — SEDIMENTATION RATE: Sed Rate: 10 mm/hr (ref 0–22)

## 2012-10-23 MED ORDER — ALBUTEROL SULFATE HFA 108 (90 BASE) MCG/ACT IN AERS: 2.0000 | INHALATION_SPRAY | RESPIRATORY_TRACT | Status: DC | PRN

## 2012-10-23 MED ORDER — PNEUMOCOCCAL VAC POLYVALENT 25 MCG/0.5ML IJ INJ: 0.5000 mL | INJECTION | INTRAMUSCULAR | Status: DC

## 2012-10-23 MED ORDER — FLUTICASONE-SALMETEROL 250-50 MCG/DOSE IN AEPB: 1.0000 | INHALATION_SPRAY | Freq: Two times a day (BID) | RESPIRATORY_TRACT | Status: DC

## 2012-10-23 MED ORDER — BENAZEPRIL HCL 20 MG PO TABS: 20.0000 mg | ORAL_TABLET | Freq: Every day | ORAL | Status: DC

## 2012-10-23 MED ORDER — PHENTERMINE HCL 15 MG PO CAPS: 15.0000 mg | ORAL_CAPSULE | ORAL | Status: DC

## 2012-10-23 MED ORDER — ATORVASTATIN CALCIUM 40 MG PO TABS: 40.0000 mg | ORAL_TABLET | Freq: Every day | ORAL | Status: DC

## 2012-10-23 MED ORDER — AMLODIPINE BESYLATE 5 MG PO TABS: 5.0000 mg | ORAL_TABLET | Freq: Every day | ORAL | Status: DC

## 2012-10-23 NOTE — Patient Instructions (Addendum)
*  RADIOLOGY REPORT*  Clinical Data: Fall for history of kidney carcinoma.  CHEST - 2 VIEW  Comparison: 11/15/2011  Findings: The heart, mediastinum and hila are unremarkable. There  is mild scarring at the apices. The lungs are hyperexpanded but  otherwise clear. The bony thorax is demineralized. No  osteoblastic or osteolytic lesions.  IMPRESSION:  No acute cardiopulmonary disease.  No evidence of metastatic disease and no change from the prior  study.  Original Report Authenticated By: Amie Portland, M.D.

## 2012-10-23 NOTE — Progress Notes (Signed)
Tammy Ball is here for refill on her prescriptions.  She would like to reduce her medications, and quite rightly expects that with weight loss she might be able to get off some of them.  She is planning on spending the holidays here with her four children and their families.  She has no symptoms.  She is cancer free according to follow up tests, and she has a followup appt with Dr. Patsi Sears next week.  She is not certain about her last tetanus or pneumonia vaccine, but is up to date on flu and shingles.  [place]  Objective:  NAD Chest:  Few faint wheezes.   Heart:  Regular without murmur, gallop or rub today Neck:  No adenopathy, thyromegaly or bruits Extremities:  No edema  *RADIOLOGY REPORT*  Clinical Data: Fall for history of kidney carcinoma.  CHEST - 2 VIEW  Comparison: 11/15/2011  Findings: The heart, mediastinum and hila are unremarkable. There  is mild scarring at the apices. The lungs are hyperexpanded but  otherwise clear. The bony thorax is demineralized. No  osteoblastic or osteolytic lesions.  IMPRESSION:  No acute cardiopulmonary disease.  No evidence of metastatic disease and no change from the prior  study.  Original Report Authenticated By: Amie Portland, M.D.   Assessment:  Controlled hypertension, asthma.  We agreed to give her a weight loss start with phentermine with the caveat that if she develops palpitations or nausea, to stop the medicine.  She will try the medicine for one month only  1. Hypertension  amLODipine (NORVASC) 5 MG tablet, benazepril (LOTENSIN) 20 MG tablet, Comprehensive metabolic panel, CBC  2. Hyperlipidemia  atorvastatin (LIPITOR) 40 MG tablet, Lipid panel  3. Asthma  albuterol (PROVENTIL HFA;VENTOLIN HFA) 108 (90 BASE) MCG/ACT inhaler, Fluticasone-Salmeterol (ADVAIR) 250-50 MCG/DOSE AEPB, pneumococcal 23 valent vaccine (PNU-IMMUNE) injection 0.5 mL  4. Overweight  phentermine 15 MG capsule   5. Glaucoma

## 2012-10-24 ENCOUNTER — Other Ambulatory Visit: Payer: Self-pay | Admitting: Physician Assistant

## 2012-10-28 DIAGNOSIS — C649 Malignant neoplasm of unspecified kidney, except renal pelvis: Secondary | ICD-10-CM | POA: Diagnosis not present

## 2012-11-20 DIAGNOSIS — H409 Unspecified glaucoma: Secondary | ICD-10-CM | POA: Diagnosis not present

## 2012-11-20 DIAGNOSIS — H4011X0 Primary open-angle glaucoma, stage unspecified: Secondary | ICD-10-CM | POA: Diagnosis not present

## 2013-02-16 ENCOUNTER — Encounter: Payer: Self-pay | Admitting: Family Medicine

## 2013-02-20 ENCOUNTER — Encounter: Payer: Self-pay | Admitting: *Deleted

## 2013-04-06 ENCOUNTER — Ambulatory Visit (INDEPENDENT_AMBULATORY_CARE_PROVIDER_SITE_OTHER): Payer: Medicare Other | Admitting: Emergency Medicine

## 2013-04-06 VITALS — BP 128/76 | HR 106 | Temp 99.0°F | Resp 17 | Ht 65.0 in | Wt 180.0 lb

## 2013-04-06 DIAGNOSIS — J018 Other acute sinusitis: Secondary | ICD-10-CM

## 2013-04-06 MED ORDER — PSEUDOEPHEDRINE-GUAIFENESIN ER 60-600 MG PO TB12: 1.0000 | ORAL_TABLET | Freq: Two times a day (BID) | ORAL | Status: DC

## 2013-04-06 MED ORDER — AMOXICILLIN-POT CLAVULANATE 875-125 MG PO TABS: 1.0000 | ORAL_TABLET | Freq: Two times a day (BID) | ORAL | Status: DC

## 2013-04-06 NOTE — Patient Instructions (Addendum)

## 2013-04-06 NOTE — Progress Notes (Signed)
[facility] [address], [place] [address] [phone]  Date:  04/06/2013   Name:  ZAHLI VETSCH   DOB:  06-07-1941   MRN:  [id number]  PCP:  [name], MD    Chief Complaint: Sore Throat, Cough and Nasal Congestion   History of Present Illness:  Tammy Ball is a 72 y.o. very pleasant female patient who presents with the following:  Ill with nasal congestion and post nasal drainage and purulent nasal drainage.  Has a cough that is productive of purulent sputum.   Says fever at night no chills. No wheezing or shortness of breath.  No nausea or vomiting.  No improvement with over the counter medications or other home remedies. Denies other complaint or health concern today.   Patient Active Problem List   Diagnosis Date Noted  . Glaucoma 10/23/2012  . Hypertension 12/17/2011  . COPD (chronic obstructive pulmonary disease) 12/17/2011  . Hearing loss 12/17/2011  . Renal cell cancer 12/17/2011  . Sciatica of left side 12/17/2011  . Depression 12/17/2011  . Hypertension 12/14/2011  . COPD (chronic obstructive pulmonary disease) 12/14/2011    Past Medical History  Diagnosis Date  . COPD (chronic obstructive pulmonary disease)   . History of kidney cancer   . Hypertension   . Cancer   . Depression   . Asthma   . Glaucoma     Past Surgical History  Procedure Laterality Date  . Nephrectomy    . Kidney cancer    . Tubal ligation    . Spine surgery      History  Substance Use Topics  . Smoking status: Former Smoker    Types: Cigarettes    Quit date: 03/16/2011  . Smokeless tobacco: Not on file  . Alcohol Use: No    Family History  Problem Relation Age of Onset  . Heart disease Sister   . Obesity Brother   . Heart disease Daughter   . Glaucoma Daughter   . Breast cancer Sister     Allergies  Allergen Reactions  . Levofloxacin     myalgia  . Levofloxacin     Dizzy and joint pains    Medication list has been reviewed  and updated.  Current Outpatient Prescriptions on File Prior to Visit  Medication Sig Dispense Refill  . amLODipine (NORVASC) 5 MG tablet Take 1 tablet (5 mg total) by mouth daily.  90 tablet  3  . atorvastatin (LIPITOR) 40 MG tablet Take 1 tablet (40 mg total) by mouth daily.  90 tablet  3  . benazepril (LOTENSIN) 20 MG tablet Take 1 tablet (20 mg total) by mouth daily.  90 tablet  3  . bimatoprost (LUMIGAN) 0.01 % SOLN 1 drop at bedtime.      . fish oil-omega-3 fatty acids 1000 MG capsule Take 1 g by mouth daily.        . Fluticasone-Salmeterol (ADVAIR) 250-50 MCG/DOSE AEPB Inhale 1 puff into the lungs every 12 (twelve) hours.  60 each  11  . albuterol (PROVENTIL HFA;VENTOLIN HFA) 108 (90 BASE) MCG/ACT inhaler Inhale 2 puffs into the lungs every 4 (four) hours as needed for wheezing or shortness of breath.  1 Inhaler  0  . aspirin 81 MG tablet Take 160 mg by mouth daily.      Marland Kitchen FLUoxetine (PROZAC) 20 MG tablet Take 1 tablet (20 mg total) by mouth daily.  30 tablet  3   No current facility-administered medications on file  prior to visit.    Review of Systems:  As per HPI, otherwise negative.    Physical Examination: Filed Vitals:   04/06/13 1208  BP: 128/76  Pulse: 106  Temp: 99 F (37.2 C)  Resp: 17   Filed Vitals:   04/06/13 1208  Height: 5\' 5"  (1.651 m)  Weight: 180 lb (81.647 kg)   Body mass index is 29.95 kg/(m^2). Ideal Body Weight: Weight in (lb) to have BMI = 25: 149.9  GEN: WDWN, NAD, Non-toxic, A & O x 3 HEENT: Atraumatic, Normocephalic. Neck supple. No masses, No LAD. Ears and Nose: No external deformity. CV: RRR, No M/G/R. No JVD. No thrill. No extra heart sounds. PULM: CTA B, no wheezes, crackles, rhonchi. No retractions. No resp. distress. No accessory muscle use. ABD: S, NT, ND, +BS. No rebound. No HSM. EXTR: No c/c/e NEURO Normal gait.  PSYCH: Normally interactive. Conversant. Not depressed or anxious appearing.  Calm demeanor.    Assessment and  Plan: Sinusitis augmentin mucinex  Signed,  Phillips Odor, MD

## 2013-04-23 DIAGNOSIS — H2510 Age-related nuclear cataract, unspecified eye: Secondary | ICD-10-CM | POA: Diagnosis not present

## 2013-04-23 DIAGNOSIS — H524 Presbyopia: Secondary | ICD-10-CM | POA: Diagnosis not present

## 2013-04-23 DIAGNOSIS — H409 Unspecified glaucoma: Secondary | ICD-10-CM | POA: Diagnosis not present

## 2013-04-23 DIAGNOSIS — H4011X0 Primary open-angle glaucoma, stage unspecified: Secondary | ICD-10-CM | POA: Diagnosis not present

## 2013-05-27 ENCOUNTER — Ambulatory Visit (INDEPENDENT_AMBULATORY_CARE_PROVIDER_SITE_OTHER): Payer: Medicare Other | Admitting: Emergency Medicine

## 2013-05-27 VITALS — BP 144/80 | HR 97 | Temp 98.2°F | Resp 20 | Ht 65.0 in | Wt 181.0 lb

## 2013-05-27 DIAGNOSIS — R05 Cough: Secondary | ICD-10-CM

## 2013-05-27 DIAGNOSIS — J441 Chronic obstructive pulmonary disease with (acute) exacerbation: Secondary | ICD-10-CM

## 2013-05-27 DIAGNOSIS — R062 Wheezing: Secondary | ICD-10-CM | POA: Diagnosis not present

## 2013-05-27 DIAGNOSIS — R0602 Shortness of breath: Secondary | ICD-10-CM

## 2013-05-27 DIAGNOSIS — R059 Cough, unspecified: Secondary | ICD-10-CM

## 2013-05-27 MED ORDER — PREDNISONE 10 MG PO TABS: ORAL_TABLET | ORAL | Status: DC

## 2013-05-27 MED ORDER — DOXYCYCLINE HYCLATE 100 MG PO CAPS: 100.0000 mg | ORAL_CAPSULE | Freq: Two times a day (BID) | ORAL | Status: DC

## 2013-05-27 MED ORDER — HYDROCODONE-HOMATROPINE 5-1.5 MG/5ML PO SYRP: 5.0000 mL | ORAL_SOLUTION | Freq: Three times a day (TID) | ORAL | Status: DC | PRN

## 2013-05-27 NOTE — Progress Notes (Signed)
  Subjective:    Patient ID: Tammy Ball, female    DOB: [birth year], 72 y.o.   MRN: [id number]  URI  This is a new problem. The current episode started in the past 7 days. The problem has been gradually worsening. There has been no fever. Associated symptoms include congestion, coughing, sneezing and wheezing. Pertinent negatives include no diarrhea, headaches, nausea or vomiting. She has tried decongestant for the symptoms. The treatment provided no relief.  Wheezing  Associated symptoms include coughing and shortness of breath. Pertinent negatives include no chills, diarrhea, fever, headaches or vomiting.    Patient is here with complaints of her COPD fairing up for 2 days she also have some rib pain when she cough she states that she had a sinus infection couple months ago and that's when the cough came. She also have some swelling in her hands and feet that's been going on for 2-3 months also  Review of Systems  Constitutional: Negative for fever and chills.  HENT: Positive for congestion, sneezing, voice change, postnasal drip and sinus pressure.        Chest congestion  Eyes: Positive for redness and itching.  Respiratory: Positive for cough, shortness of breath and wheezing.   Gastrointestinal: Negative for nausea, vomiting and diarrhea.  Neurological: Negative for dizziness and headaches.       Objective:   Physical Exam patient is alert and cooperative she is not in any distress her throat is normal the neck is supple chest exam reveals an increased AP diameter there are inspiratory and expiratory wheezes noted heart was a regular rate no murmurs.        Assessment & Plan:  Patient seen with an acute exacerbation of chronic bronchitis. We'll treat with doxycycline along with a tapered dose of prednisone Hycodan at night for cough. Patient experiencing some weight gain and what she says is swelling of her hands and feet at times. I advised her to make an appointment to see Dr.  Arnoldo Lenis so he can check this. Her last creatinine was 1.06 in December 2013 I did advise her to go ahead and stop her Norvasc in that this would most likely be the source of her swelling

## 2013-05-29 DIAGNOSIS — H2510 Age-related nuclear cataract, unspecified eye: Secondary | ICD-10-CM | POA: Diagnosis not present

## 2013-06-29 DIAGNOSIS — H2510 Age-related nuclear cataract, unspecified eye: Secondary | ICD-10-CM | POA: Diagnosis not present

## 2013-06-29 DIAGNOSIS — H269 Unspecified cataract: Secondary | ICD-10-CM | POA: Diagnosis not present

## 2013-06-29 DIAGNOSIS — H2589 Other age-related cataract: Secondary | ICD-10-CM | POA: Diagnosis not present

## 2013-07-03 ENCOUNTER — Ambulatory Visit (INDEPENDENT_AMBULATORY_CARE_PROVIDER_SITE_OTHER): Payer: Medicare Other | Admitting: Internal Medicine

## 2013-07-03 VITALS — BP 160/90 | HR 92 | Temp 99.0°F | Resp 16 | Ht 64.5 in | Wt 177.6 lb

## 2013-07-03 DIAGNOSIS — R109 Unspecified abdominal pain: Secondary | ICD-10-CM | POA: Diagnosis not present

## 2013-07-03 DIAGNOSIS — IMO0002 Reserved for concepts with insufficient information to code with codable children: Secondary | ICD-10-CM | POA: Diagnosis not present

## 2013-07-03 DIAGNOSIS — M5416 Radiculopathy, lumbar region: Secondary | ICD-10-CM

## 2013-07-03 DIAGNOSIS — I10 Essential (primary) hypertension: Secondary | ICD-10-CM

## 2013-07-03 DIAGNOSIS — M4316 Spondylolisthesis, lumbar region: Secondary | ICD-10-CM | POA: Insufficient documentation

## 2013-07-03 LAB — POCT URINALYSIS DIPSTICK
Bilirubin, UA: NEGATIVE
Blood, UA: NEGATIVE
Glucose, UA: NEGATIVE
Ketones, UA: NEGATIVE
Leukocytes, UA: NEGATIVE
Nitrite, UA: NEGATIVE
Protein, UA: NEGATIVE
Spec Grav, UA: 1.015
Urobilinogen, UA: 0.2
pH, UA: 7

## 2013-07-03 LAB — COMPREHENSIVE METABOLIC PANEL
ALT: 18 U/L (ref 0–35)
AST: 19 U/L (ref 0–37)
Albumin: 4.6 g/dL (ref 3.5–5.2)
Alkaline Phosphatase: 95 U/L (ref 39–117)
BUN: 13 mg/dL (ref 6–23)
CO2: 30 mEq/L (ref 19–32)
Calcium: 10.4 mg/dL (ref 8.4–10.5)
Chloride: 103 mEq/L (ref 96–112)
Creat: 1.26 mg/dL — ABNORMAL HIGH (ref 0.50–1.10)
Glucose, Bld: 111 mg/dL — ABNORMAL HIGH (ref 70–99)
Potassium: 5.6 mEq/L — ABNORMAL HIGH (ref 3.5–5.3)
Sodium: 142 mEq/L (ref 135–145)
Total Bilirubin: 0.4 mg/dL (ref 0.3–1.2)
Total Protein: 7.4 g/dL (ref 6.0–8.3)

## 2013-07-03 LAB — POCT CBC
Granulocyte percent: 59.2 %G (ref 37–80)
HCT, POC: 46.3 % (ref 37.7–47.9)
Hemoglobin: 14.6 g/dL (ref 12.2–16.2)
Lymph, poc: 2.4 (ref 0.6–3.4)
MCH, POC: 29.9 pg (ref 27–31.2)
MCHC: 31.5 g/dL — AB (ref 31.8–35.4)
MCV: 94.8 fL (ref 80–97)
MID (cbc): 0.5 (ref 0–0.9)
MPV: 9.6 fL (ref 0–99.8)
POC Granulocyte: 4.3 (ref 2–6.9)
POC LYMPH PERCENT: 33.7 %L (ref 10–50)
POC MID %: 7.1 %M (ref 0–12)
Platelet Count, POC: 372 10*3/uL (ref 142–424)
RBC: 4.88 M/uL (ref 4.04–5.48)
RDW, POC: 16.6 %
WBC: 7.2 10*3/uL (ref 4.6–10.2)

## 2013-07-03 LAB — POCT UA - MICROSCOPIC ONLY
Bacteria, U Microscopic: NEGATIVE
Casts, Ur, LPF, POC: NEGATIVE
Crystals, Ur, HPF, POC: NEGATIVE
Mucus, UA: NEGATIVE
RBC, urine, microscopic: NEGATIVE
WBC, Ur, HPF, POC: NEGATIVE
Yeast, UA: NEGATIVE

## 2013-07-03 MED ORDER — HYDROCODONE-ACETAMINOPHEN 5-325 MG PO TABS: 1.0000 | ORAL_TABLET | Freq: Four times a day (QID) | ORAL | Status: AC | PRN

## 2013-07-03 MED ORDER — PREDNISONE 20 MG PO TABS: ORAL_TABLET | ORAL | Status: DC

## 2013-07-03 MED ORDER — KETOROLAC TROMETHAMINE 60 MG/2ML IM SOLN
60.0000 mg | Freq: Once | INTRAMUSCULAR | Status: AC
  Administered 2013-07-03: 60 mg via INTRAMUSCULAR

## 2013-07-03 NOTE — Progress Notes (Addendum)
Subjective:    Patient ID: Tammy Ball, female    DOB: [birth year], 72 y.o.   MRN: [id number]  HPI  Pain in lower, right abdomen, stretches across back. Pain stabbing in nature with aching, worse with twisting to right. Accompanied by nausea. This has been going on for 3 days. Reminds her of kidney pain she had previously with cancer. Has had difficulty sleeping because of pain. No relief with tylenol or MOM, heat, ice.Does not recall injury, no heavy lifting. Cataract surgery on right Monday, was seen in followup yesterday. No eye pain.Disappointed that vision not better after surgery. Surgeon told her to continue to expect improvement. Has had no more swelling since stopping Norvasc. Fever intermittently for 3 days up to 100.2. Was told could be related to recent cataract surgery.  History of significant back pain for the last 2-3 years. X-rays done here in the fall 2013. She has responded to acute flares of her back pain and prednisone but the frequency of problems is getting much worse.  Patient Active Problem List   Diagnosis Date Noted  . Glaucoma 10/23/2012  . Hypertension 12/17/2011  . COPD (chronic obstructive pulmonary disease) 12/17/2011  . Hearing loss 12/17/2011  . Renal cell cancer 12/17/2011  . Sciatica of left side 12/17/2011  . Depression 12/17/2011  . Hypertension 12/14/2011  . COPD (chronic obstructive pulmonary disease) 12/14/2011  Current outpatient prescriptions:albuterol (PROVENTIL HFA;VENTOLIN HFA) 108 (90 BASE) MCG/ACT inhaler, Inhale 2 puffs into the lungs every 4 (four) hours as needed for wheezing or shortness of breath., Disp: 1 Inhaler, Rfl: 0;   atorvastatin (LIPITOR) 40 MG tablet, Take 1 tablet (40 mg total) by mouth daily., Disp: 90 tablet, Rfl: 3;   benazepril (LOTENSIN) 20 MG tablet, Take 1 tablet (20 mg total) by mouth daily., Disp: 90 tablet, Rfl: 3 bimatoprost (LUMIGAN) 0.01 % SOLN, 1 drop at bedtime., Disp: , Rfl: ;  fish oil-omega-3 fatty acids  1000 MG capsule, Take 1 g by mouth daily.  , Disp: , Rfl: ;   Fluticasone-Salmeterol (ADVAIR) 250-50 MCG/DOSE AEPB, Inhale 1 puff into the lungs every 12 (twelve) hours., Disp: 60 each, Rfl: 11;  amLODipine (NORVASC) 5 MG tablet, Take 1 tablet (5 mg total) by mouth daily., Disp: 90 tablet, Rfl: 3 FLUoxetine (PROZAC) 20 MG tablet, Take 1 tablet (20 mg total) by mouth daily., Disp: 30 tablet, Rfl: 3 HYDROcodone-homatropine (HYCODAN) 5-1.5 MG/5ML syrup, Take 5 mLs by mouth every 8 (eight) hours as needed for cough., Disp: 120 mL, Rfl: 0;   predniSONE (DELTASONE) 10 MG tablet, Take 4 daily for 3 days 3 a day for 3 days 2 a day for 3 days one a day for 3 days, Disp: 30 tablet, Rfl: 0;   pseudoephedrine-guaifenesin (MUCINEX D) 60-600 MG per tablet, Take 1 tablet by mouth every 12 (twelve) hours., Disp: 18 tablet, Rfl: 0     Past imaging of back= From oct 2013: Findings: Multiple views of the lumbar spine demonstrate no acute  displaced fractures or compression type fractures. There is  multilevel degenerative disc disease, most severe at L4-L5 and L5-  S1. 4 mm of anterolisthesis of L4 upon L5-8 is unchanged.  Multilevel facet arthropathy is also noted, most severe in the  lower lumbar spine.  Review of Systems  Constitutional: Positive for fever and appetite change.  Eyes: Negative for pain.  Respiratory: Negative for cough, chest tightness and shortness of breath.   Cardiovascular: Positive for leg swelling. Negative for chest pain.  Gastrointestinal: Positive  for nausea and abdominal pain. Negative for vomiting, diarrhea, constipation and abdominal distention.  Genitourinary: Negative for dysuria, urgency, frequency, hematuria, decreased urine volume, vaginal discharge and difficulty urinating.       Objective:   Physical Exam  Constitutional: She is oriented to person, place, and time. She appears well-developed and well-nourished. No distress.  Pulmonary/Chest: Effort normal and breath  sounds normal. No respiratory distress. She has no wheezes.  No SOB with head at 15 degrees.  Abdominal: Soft. Bowel sounds are normal. She exhibits no distension and no mass. There is tenderness. There is no rebound and no guarding.  Neurological: She is alert and oriented to person, place, and time.  Skin: Skin is warm and dry.   spine is straight Tender over the lower lumbar area and the right SI area Straight leg raise positive on the right at 75 Pain with twisting and forward flexion that mimics her acute presentation No CVA tenderness Deep tendon reflexes symmetrical No distal motor or sensory losses        Results for orders placed in visit on 07/03/13  POCT UA - MICROSCOPIC ONLY      Result Value Range   WBC, Ur, HPF, POC neg     RBC, urine, microscopic neg     Bacteria, U Microscopic neg     Mucus, UA neg     Epithelial cells, urine per micros 0-1     Crystals, Ur, HPF, POC neg     Casts, Ur, LPF, POC neg     Yeast, UA neg    POCT URINALYSIS DIPSTICK      Result Value Range   Color, UA yellow     Clarity, UA clear     Glucose, UA neg     Bilirubin, UA neg     Ketones, UA neg     Spec Grav, UA 1.015     Blood, UA neg     pH, UA 7.0     Protein, UA neg     Urobilinogen, UA 0.2     Nitrite, UA neg     Leukocytes, UA Negative    POCT CBC      Result Value Range   WBC 7.2  4.6 - 10.2 K/uL   Lymph, poc 2.4  0.6 - 3.4   POC LYMPH PERCENT 33.7  10 - 50 %L   MID (cbc) 0.5  0 - 0.9   POC MID % 7.1  0 - 12 %M   POC Granulocyte 4.3  2 - 6.9   Granulocyte percent 59.2  37 - 80 %G   RBC 4.88  4.04 - 5.48 M/uL   Hemoglobin 14.6  12.2 - 16.2 g/dL   HCT, POC 02.7  25.3 - 47.9 %   MCV 94.8  80 - 97 fL   MCH, POC 29.9  27 - 31.2 pg   MCHC 31.5 (*) 31.8 - 35.4 g/dL   RDW, POC 66.4     Platelet Count, POC 372  142 - 424 K/uL   MPV 9.6  0 - 99.8 fL    Assessment & Plan:  Abdominal  pain, other specified site -this is likely radicular from her spine Radiculopathy of  lumbar region - Plan: ketorolac (TORADOL) injection 60 mg now  Set up MRI/okay for meds Spondylolisthesis of lumbar region   Meds ordered this encounter  Medications  . ketorolac (TORADOL) injection 60 mg    Sig:   . predniSONE (DELTASONE) 20 MG tablet  Sig: 3/3/2/2/1/1single daily dose for 6 days    Dispense:  12 tablet    Refill:  0  . HYDROcodone-acetaminophen (NORCO/VICODIN) 5-325 MG per tablet    Sig: Take 1 tablet by mouth every 6 (six) hours as needed for pain.    Dispense:  30 tablet    Refill:  0   I have reviewed and agree with documentation. I fully participated in the evaluation. Robert P. Merla Riches, M.D.  Addendum 07/12/13: MRI documents disease but no significant progression-needs orthopedic referral Potassium 5.6, and creatinine marginally elevated as it was in January 2013-will suggest stopping benazepril and increasing Norvasc to 10 mg with followup in one month for retesting and rechecking blood pressure

## 2013-07-11 ENCOUNTER — Ambulatory Visit
Admission: RE | Admit: 2013-07-11 | Discharge: 2013-07-11 | Disposition: A | Discharge status: Home / Self Care | Payer: Medicare Other | Source: Ambulatory Visit | Attending: Internal Medicine | Admitting: Internal Medicine

## 2013-07-11 DIAGNOSIS — M4310 Spondylolisthesis, site unspecified: Secondary | ICD-10-CM | POA: Diagnosis not present

## 2013-07-11 DIAGNOSIS — M5416 Radiculopathy, lumbar region: Secondary | ICD-10-CM

## 2013-07-11 DIAGNOSIS — M4316 Spondylolisthesis, lumbar region: Secondary | ICD-10-CM

## 2013-07-11 DIAGNOSIS — M47817 Spondylosis without myelopathy or radiculopathy, lumbosacral region: Secondary | ICD-10-CM | POA: Diagnosis not present

## 2013-07-11 DIAGNOSIS — M5137 Other intervertebral disc degeneration, lumbosacral region: Secondary | ICD-10-CM | POA: Diagnosis not present

## 2013-07-12 MED ORDER — AMLODIPINE BESYLATE 10 MG PO TABS: 10.0000 mg | ORAL_TABLET | Freq: Every day | ORAL | Status: DC

## 2013-07-12 NOTE — Addendum Note (Signed)
Addended by: Tonye Pearson on: 07/12/2013 01:00 PM   Modules accepted: Orders, Medications

## 2013-07-16 ENCOUNTER — Telehealth: Payer: Self-pay

## 2013-07-16 NOTE — Telephone Encounter (Signed)
Pt received a call from here today. Was calling back. Call at [phone].

## 2013-07-16 NOTE — Telephone Encounter (Signed)
See labs 

## 2013-07-17 ENCOUNTER — Encounter: Payer: Self-pay | Admitting: Family Medicine

## 2013-07-18 ENCOUNTER — Ambulatory Visit (INDEPENDENT_AMBULATORY_CARE_PROVIDER_SITE_OTHER): Payer: Medicare Other | Admitting: Family Medicine

## 2013-07-18 ENCOUNTER — Encounter: Payer: Self-pay | Admitting: Family Medicine

## 2013-07-18 VITALS — BP 140/84 | HR 80 | Temp 99.0°F | Resp 16 | Ht 65.0 in | Wt 178.2 lb

## 2013-07-18 DIAGNOSIS — M549 Dorsalgia, unspecified: Secondary | ICD-10-CM

## 2013-07-18 DIAGNOSIS — Z23 Encounter for immunization: Secondary | ICD-10-CM

## 2013-07-18 DIAGNOSIS — I10 Essential (primary) hypertension: Secondary | ICD-10-CM | POA: Diagnosis not present

## 2013-07-18 MED ORDER — BENAZEPRIL-HYDROCHLOROTHIAZIDE 20-12.5 MG PO TABS: 1.0000 | ORAL_TABLET | Freq: Every day | ORAL | Status: DC

## 2013-07-18 MED ORDER — METHYLPREDNISOLONE (PAK) 4 MG PO TABS: ORAL_TABLET | ORAL | Status: DC

## 2013-07-18 MED ORDER — TRAMADOL HCL 50 MG PO TABS: 50.0000 mg | ORAL_TABLET | Freq: Three times a day (TID) | ORAL | Status: DC | PRN

## 2013-07-18 NOTE — Progress Notes (Signed)
72 yo woman with hypertension and recent problem with cataract excision.  She was experiencing edema, so in July she stopped norvasc and started benazepril.  She was subsequently found to have elevated potassium.  She continues to have back pain after 2011 onset with left sciatica.  Periodically has right sciatica with chronic back pain.  Prednisone helped her back.   Objective:  NAD  Results for orders placed in visit on 07/03/13  COMPREHENSIVE METABOLIC PANEL      Result Value Range   Sodium 142  135 - 145 mEq/L   Potassium 5.6 (*) 3.5 - 5.3 mEq/L   Chloride 103  96 - 112 mEq/L   CO2 30  19 - 32 mEq/L   Glucose, Bld 111 (*) 70 - 99 mg/dL   BUN 13  6 - 23 mg/dL   Creat 4.09 (*) 8.11 - 1.10 mg/dL   Total Bilirubin 0.4  0.3 - 1.2 mg/dL   Alkaline Phosphatase 95  39 - 117 U/L   AST 19  0 - 37 U/L   ALT 18  0 - 35 U/L   Total Protein 7.4  6.0 - 8.3 g/dL   Albumin 4.6  3.5 - 5.2 g/dL   Calcium 91.4  8.4 - 78.2 mg/dL  POCT UA - MICROSCOPIC ONLY      Result Value Range   WBC, Ur, HPF, POC neg     RBC, urine, microscopic neg     Bacteria, U Microscopic neg     Mucus, UA neg     Epithelial cells, urine per micros 0-1     Crystals, Ur, HPF, POC neg     Casts, Ur, LPF, POC neg     Yeast, UA neg    POCT URINALYSIS DIPSTICK      Result Value Range   Color, UA yellow     Clarity, UA clear     Glucose, UA neg     Bilirubin, UA neg     Ketones, UA neg     Spec Grav, UA 1.015     Blood, UA neg     pH, UA 7.0     Protein, UA neg     Urobilinogen, UA 0.2     Nitrite, UA neg     Leukocytes, UA Negative    POCT CBC      Result Value Range   WBC 7.2  4.6 - 10.2 K/uL   Lymph, poc 2.4  0.6 - 3.4   POC LYMPH PERCENT 33.7  10 - 50 %L   MID (cbc) 0.5  0 - 0.9   POC MID % 7.1  0 - 12 %M   POC Granulocyte 4.3  2 - 6.9   Granulocyte percent 59.2  37 - 80 %G   RBC 4.88  4.04 - 5.48 M/uL   Hemoglobin 14.6  12.2 - 16.2 g/dL   HCT, POC 95.6  21.3 - 47.9 %   MCV 94.8  80 - 97 fL   MCH,  POC 29.9  27 - 31.2 pg   MCHC 31.5 (*) 31.8 - 35.4 g/dL   RDW, POC 08.6     Platelet Count, POC 372  142 - 424 K/uL   MPV 9.6  0 - 99.8 fL   Chest clear Heart  Reg, no murmur Reflexes:  Absent KJ and AJ SLR positive bilaterally sitting at 90 degrees. MRI LUMBAR SPINE WITHOUT CONTRAST  Technique: Multiplanar and multiecho pulse sequences of the lumbar  spine were obtained without  intravenous contrast.  Comparison: CT abdomen and pelvis 02/06/2011.  Findings: Vertebral body height and signal are maintained. 0.5 cm  of anterolisthesis of L4 on L5 due to facet arthropathy is  unchanged. Tarlov cysts in the sacral segments are incidentally  noted. The conus medullaris is normal in signal and position.  Imaged intra-abdominal contents are unremarkable.  The T11-12 and T12-L1 levels are imaged in the sagittal plane only  and negative.  L1-2: Shallow central protrusion without central canal or  foraminal narrowing.  L2-3: Negative.  L3-4: Mild facet degenerative disease is identified. Small  annular tear in the left foramen is noted without associated  protrusion. The central canal and foramina are open.  L4-5: There is facet arthropathy. The disc is uncovered but there  is no disc bulge. The central canal and foramina are open.  L5-S1: There is facet degenerative disease. No disc bulge or  protrusion. The central canal and foramina are open.  IMPRESSION:  1. Negative for central canal or foraminal stenosis with only mild  degenerative disc disease as described above.  2. No change in 0.5 cm anterolisthesis of L4 on L5 due to facet  arthropathy.  Original Report Authenticated By: Holley [name], M.D.  Hypertension - Plan: benazepril-hydrochlorthiazide (LOTENSIN HCT) 20-12.5 MG per tablet  Back pain - Plan: methylPREDNIsolone (MEDROL DOSPACK) 4 MG tablet, traMADol (ULTRAM) 50 MG tablet  Signed, [name], MD

## 2013-07-18 NOTE — Patient Instructions (Signed)
MRI LUMBAR SPINE WITHOUT CONTRAST  Technique: Multiplanar and multiecho pulse sequences of the lumbar  spine were obtained without intravenous contrast.  Comparison: CT abdomen and pelvis 02/06/2011.  Findings: Vertebral body height and signal are maintained. 0.5 cm  of anterolisthesis of L4 on L5 due to facet arthropathy is  unchanged. Tarlov cysts in the sacral segments are incidentally  noted. The conus medullaris is normal in signal and position.  Imaged intra-abdominal contents are unremarkable.  The T11-12 and T12-L1 levels are imaged in the sagittal plane only  and negative.  L1-2: Shallow central protrusion without central canal or  foraminal narrowing.  L2-3: Negative.  L3-4: Mild facet degenerative disease is identified. Small  annular tear in the left foramen is noted without associated  protrusion. The central canal and foramina are open.  L4-5: There is facet arthropathy. The disc is uncovered but there  is no disc bulge. The central canal and foramina are open.  L5-S1: There is facet degenerative disease. No disc bulge or  protrusion. The central canal and foramina are open.  IMPRESSION:  1. Negative for central canal or foraminal stenosis with only mild  degenerative disc disease as described above.  2. No change in 0.5 cm anterolisthesis of L4 on L5 due to facet  arthropathy.  Original Report Authenticated By: Holley [name], M.D.  Sciatica Sciatica is pain, weakness, numbness, or tingling along the path of the sciatic nerve. The nerve starts in the lower back and runs down the back of each leg. The nerve controls the muscles in the lower leg and in the back of the knee, while also providing sensation to the back of the thigh, lower leg, and the sole of your foot. Sciatica is a symptom of another medical condition. For instance, nerve damage or certain conditions, such as a herniated disk or bone spur on the spine, pinch or put pressure on the sciatic nerve. This causes  the pain, weakness, or other sensations normally associated with sciatica. Generally, sciatica only affects one side of the body. CAUSES   Herniated or slipped disc.  Degenerative disk disease.  A pain disorder involving the narrow muscle in the buttocks (piriformis syndrome).  Pelvic injury or fracture.  Pregnancy.  Tumor (rare). SYMPTOMS  Symptoms can vary from mild to very severe. The symptoms usually travel from the low back to the buttocks and down the back of the leg. Symptoms can include:  Mild tingling or dull aches in the lower back, leg, or hip.  Numbness in the back of the calf or sole of the foot.  Burning sensations in the lower back, leg, or hip.  Sharp pains in the lower back, leg, or hip.  Leg weakness.  Severe back pain inhibiting movement. These symptoms may get worse with coughing, sneezing, laughing, or prolonged sitting or standing. Also, being overweight may worsen symptoms. DIAGNOSIS  Your caregiver will perform a physical exam to look for common symptoms of sciatica. He or she may ask you to do certain movements or activities that would trigger sciatic nerve pain. Other tests may be performed to find the cause of the sciatica. These may include:  Blood tests.  X-rays.  Imaging tests, such as an MRI or CT scan. TREATMENT  Treatment is directed at the cause of the sciatic pain. Sometimes, treatment is not necessary and the pain and discomfort goes away on its own. If treatment is needed, your caregiver may suggest:  Over-the-counter medicines to relieve pain.  Prescription medicines, such as  anti-inflammatory medicine, muscle relaxants, or narcotics.  Applying heat or ice to the painful area.  Steroid injections to lessen pain, irritation, and inflammation around the nerve.  Reducing activity during periods of pain.  Exercising and stretching to strengthen your abdomen and improve flexibility of your spine. Your caregiver may suggest losing  weight if the extra weight makes the back pain worse.  Physical therapy.  Surgery to eliminate what is pressing or pinching the nerve, such as a bone spur or part of a herniated disk. HOME CARE INSTRUCTIONS   Only take over-the-counter or prescription medicines for pain or discomfort as directed by your caregiver.  Apply ice to the affected area for 20 minutes, 3 4 times a day for the first 48 72 hours. Then try heat in the same way.  Exercise, stretch, or perform your usual activities if these do not aggravate your pain.  Attend physical therapy sessions as directed by your caregiver.  Keep all follow-up appointments as directed by your caregiver.  Do not wear high heels or shoes that do not provide proper support.  Check your mattress to see if it is too soft. A firm mattress may lessen your pain and discomfort. SEEK IMMEDIATE MEDICAL CARE IF:   You lose control of your bowel or bladder (incontinence).  You have increasing weakness in the lower back, pelvis, buttocks, or legs.  You have redness or swelling of your back.  You have a burning sensation when you urinate.  You have pain that gets worse when you lie down or awakens you at night.  Your pain is worse than you have experienced in the past.  Your pain is lasting longer than 4 weeks.  You are suddenly losing weight without reason. MAKE SURE YOU:  Understand these instructions.  Will watch your condition.  Will get help right away if you are not doing well or get worse. Document Released: 10/23/2001 Document Revised: 04/29/2012 Document Reviewed: 03/09/2012 [facility] Patient Information 2014 [place], [place].

## 2013-07-27 ENCOUNTER — Encounter: Payer: Self-pay | Admitting: Family Medicine

## 2013-07-29 NOTE — Telephone Encounter (Signed)
Patient coming in tomorrow, I will keep an eye out, and let you know when she is here.

## 2013-07-30 ENCOUNTER — Other Ambulatory Visit: Payer: Self-pay | Admitting: Family Medicine

## 2013-07-30 DIAGNOSIS — H18509 Unspecified hereditary corneal dystrophies, unspecified eye: Secondary | ICD-10-CM | POA: Diagnosis not present

## 2013-07-30 DIAGNOSIS — R002 Palpitations: Secondary | ICD-10-CM

## 2013-08-14 ENCOUNTER — Encounter: Payer: Self-pay | Admitting: Cardiovascular Disease

## 2013-08-14 ENCOUNTER — Ambulatory Visit (INDEPENDENT_AMBULATORY_CARE_PROVIDER_SITE_OTHER): Payer: Medicare Other | Admitting: Cardiovascular Disease

## 2013-08-14 VITALS — BP 148/90 | HR 109 | Ht 65.0 in | Wt 177.4 lb

## 2013-08-14 DIAGNOSIS — Z79899 Other long term (current) drug therapy: Secondary | ICD-10-CM

## 2013-08-14 DIAGNOSIS — I10 Essential (primary) hypertension: Secondary | ICD-10-CM

## 2013-08-14 DIAGNOSIS — E785 Hyperlipidemia, unspecified: Secondary | ICD-10-CM | POA: Insufficient documentation

## 2013-08-14 DIAGNOSIS — R000 Tachycardia, unspecified: Secondary | ICD-10-CM

## 2013-08-14 MED ORDER — BENAZEPRIL HCL 40 MG PO TABS: 40.0000 mg | ORAL_TABLET | Freq: Every day | ORAL | Status: DC

## 2013-08-14 NOTE — Assessment & Plan Note (Signed)
On lisinopril hydrochlorothiazide. Apparently she is intolerant to the hydrochlorothiazide and has borderline blood pressures today. I'm going to discontinue the diuretic and increase her ACE inhibitor. She'll see Belenda Cruise back in the office in one month for BP check, and mid-level back in 6 months and he back in one year

## 2013-08-14 NOTE — Patient Instructions (Addendum)
Stop the lotensin hct.  Start lotensin 40mg  daily  Have bloodwork checked in 2-3 weeks  Come back for a blood pressure check in 1 month with our pharmacist, Belenda Cruise  Dr Allyson Sabal will see you back in 1 year and an extender in 6 months

## 2013-08-14 NOTE — Assessment & Plan Note (Signed)
On statin therapy followed by her PCP. The most recent lipid profile performed 10/23/12 revealed a total cholesterol 172, LDL of 86 and HDL of 68

## 2013-08-14 NOTE — Progress Notes (Signed)
08/14/2013 Tammy Ball   05/02/1941  [id number]  Primary Physician Tammy Sidle, MD Primary Cardiologist: Runell Gess MD Tammy Ball   HPI:  [name] is a 72 year old moderately overweight divorced Caucasian female mother of 4, grandmother 3 grandchildren and great grandmother for brachial grandchildren who I last saw 4 years ago (02/03/09). She still works as a Pensions consultant. Her cardiac risk factors include 45-pack-years of tobacco abuse having quit June 2012. She also history of hypertension and hypokalemia. Her mother had bypass grafting in her 94s. She has never had a heart attack or stroke. She does complain of some dyspnea but denies chest pain. She was been too for hypertension by her regular physician who added a diuretic because of some lower extremity edema that resulted in diaphoresis and tach tachypalpitations.   Current Outpatient Prescriptions  Medication Sig Dispense Refill  . albuterol (PROVENTIL HFA;VENTOLIN HFA) 108 (90 BASE) MCG/ACT inhaler Inhale 2 puffs into the lungs every 4 (four) hours as needed for wheezing or shortness of breath.  1 Inhaler  0  . aspirin 81 MG tablet Take 160 mg by mouth daily.      Marland Kitchen atorvastatin (LIPITOR) 40 MG tablet Take 1 tablet (40 mg total) by mouth daily.  90 tablet  3  . fish oil-omega-3 fatty acids 1000 MG capsule Take 1 g by mouth daily.        . Fluticasone-Salmeterol (ADVAIR) 250-50 MCG/DOSE AEPB Inhale 1 puff into the lungs every 12 (twelve) hours.  60 each  11  . traMADol (ULTRAM) 50 MG tablet Take 1 tablet (50 mg total) by mouth every 8 (eight) hours as needed for pain.  30 tablet  0  . benazepril (LOTENSIN) 40 MG tablet Take 1 tablet (40 mg total) by mouth daily.  90 tablet  3   No current facility-administered medications for this visit.    Allergies  Allergen Reactions  . Levofloxacin     myalgia  . Amlodipine Swelling  . Levofloxacin     Dizzy and joint pains    History   Social  History  . Marital Status: Widowed    Spouse Name: N/A    Number of Children: N/A  . Years of Education: N/A   Occupational History  . Not on file.   Social History Main Topics  . Smoking status: Former Smoker    Types: Cigarettes    Quit date: 03/16/2011  . Smokeless tobacco: Not on file  . Alcohol Use: No  . Drug Use: No  . Sexual Activity: Not Currently   Other Topics Concern  . Not on file   Social History Narrative   Occupation: Substance Abuse Counselor   Divorced   College Education   No exercise** Merged History Encounter **       ** Data from: 12/14/11 Enc Dept: [facility]       ** Data from: 12/17/11 Enc Dept: [facility]   Substance abuse counselor   Husband deceased   4 great grandchildren   Son works in same substance abuse counseling center as patient     Review of Systems: General: negative for chills, fever, night sweats or weight changes.  Cardiovascular: negative for chest pain, dyspnea on exertion, edema, orthopnea, palpitations, paroxysmal nocturnal dyspnea or shortness of breath Dermatological: negative for rash Respiratory: negative for cough or wheezing Urologic: negative for hematuria Abdominal: negative for nausea, vomiting, diarrhea, bright red blood per rectum, melena, or hematemesis Neurologic:  negative for visual changes, syncope, or dizziness All other systems reviewed and are otherwise negative except as noted above.    Blood pressure 148/90, pulse 109, height 5\' 5"  (1.651 m), weight 177 lb 6.4 oz (80.468 kg).  General appearance: alert and no distress Neck: no adenopathy, no carotid bruit, no JVD, supple, symmetrical, trachea midline and thyroid not enlarged, symmetric, no tenderness/mass/nodules Lungs: clear to auscultation bilaterally Heart: regular rate and rhythm, S1, S2 normal, no murmur, click, rub or gallop Abdomen: soft, non-tender; bowel sounds normal; no masses,  no organomegaly Extremities: extremities  normal, atraumatic, no cyanosis or edema Pulses: 2+ and symmetric  EKG sinus tachycardia at 109 with incomplete right bundle-branch block and left left axis deviation  ASSESSMENT AND PLAN:   Hyperlipidemia On statin therapy followed by her PCP. The most recent lipid profile performed 10/23/12 revealed a total cholesterol 172, LDL of 86 and HDL of 68  Hypertension On lisinopril hydrochlorothiazide. Apparently she is intolerant to the hydrochlorothiazide and has borderline blood pressures today. I'm going to discontinue the diuretic and increase her ACE inhibitor. She'll see Belenda Cruise back in the office in one month for BP check, and mid-level back in 6 months and he back in one year      Runell Gess MD [facility], [facility] 08/14/2013 10:42 AM

## 2013-08-17 ENCOUNTER — Encounter: Payer: Self-pay | Admitting: Cardiovascular Disease

## 2013-08-19 DIAGNOSIS — H2510 Age-related nuclear cataract, unspecified eye: Secondary | ICD-10-CM | POA: Diagnosis not present

## 2013-09-04 DIAGNOSIS — Z79899 Other long term (current) drug therapy: Secondary | ICD-10-CM | POA: Diagnosis not present

## 2013-09-04 LAB — BASIC METABOLIC PANEL
BUN: 15 mg/dL (ref 6–23)
CO2: 28 mEq/L (ref 19–32)
Calcium: 10.1 mg/dL (ref 8.4–10.5)
Chloride: 104 mEq/L (ref 96–112)
Creat: 1.11 mg/dL — ABNORMAL HIGH (ref 0.50–1.10)
Glucose, Bld: 101 mg/dL — ABNORMAL HIGH (ref 70–99)
Potassium: 5.1 mEq/L (ref 3.5–5.3)
Sodium: 142 mEq/L (ref 135–145)

## 2013-09-07 DIAGNOSIS — H2589 Other age-related cataract: Secondary | ICD-10-CM | POA: Diagnosis not present

## 2013-09-07 DIAGNOSIS — H269 Unspecified cataract: Secondary | ICD-10-CM | POA: Diagnosis not present

## 2013-09-07 DIAGNOSIS — H2510 Age-related nuclear cataract, unspecified eye: Secondary | ICD-10-CM | POA: Diagnosis not present

## 2013-09-08 ENCOUNTER — Encounter: Payer: Self-pay | Admitting: *Deleted

## 2013-09-14 ENCOUNTER — Encounter: Payer: Medicare Other | Admitting: Pharmacist Clinician (PhC)/ Clinical Pharmacy Specialist

## 2013-09-21 ENCOUNTER — Ambulatory Visit (INDEPENDENT_AMBULATORY_CARE_PROVIDER_SITE_OTHER): Payer: Medicare Other | Admitting: Pharmacist Clinician (PhC)/ Clinical Pharmacy Specialist

## 2013-09-21 VITALS — BP 150/84 | Ht 65.0 in | Wt 181.4 lb

## 2013-09-21 DIAGNOSIS — I10 Essential (primary) hypertension: Secondary | ICD-10-CM | POA: Diagnosis not present

## 2013-09-21 NOTE — Patient Instructions (Signed)
Return in 1 month for blood pressure check.  Your blood pressure today is good at 150/84 Check your blood pressure at home daily (if able) and keep record of the readings.  Take your BP meds as follows:  AM: benazepril 40mg    Bring all of your meds, your BP cuff and your record of home blood pressures to your next appointment.  Exercise as you're able, try to walk approximately 30 minutes per day.  Keep salt intake to a minimum, especially watch canned and prepared boxed foods.  Eat more fresh fruits and vegetables and fewer canned items.  Avoid eating in fast food restaurants.    HOW TO TAKE YOUR BLOOD PRESSURE:   Rest 5 minutes before taking your blood pressure.    Don't smoke or drink caffeinated beverages for at least 30 minutes before.   Take your blood pressure before (not after) you eat.   Sit comfortably with your back supported and both feet on the floor (don't cross your legs).   Elevate your arm to heart level on a table or a desk.   Use the proper sized cuff. It should fit smoothly and snugly around your bare upper arm. There should be enough room to slip a fingertip under the cuff. The bottom edge of the cuff should be 1 inch above the crease of the elbow.   Ideally, take 3 measurements at one sitting and record the average.

## 2013-09-22 ENCOUNTER — Encounter: Payer: Self-pay | Admitting: Pharmacist Clinician (PhC)/ Clinical Pharmacy Specialist

## 2013-09-22 NOTE — Progress Notes (Signed)
     09/22/2013 Tammy Ball [birth year] [id number]   HPI:  Tammy Ball is a 72 y.o. female patient of Dr Allyson Sabal, with a PMH below who presents today for a blood pressure check.  She states she has had problems with her BP for the past several years and is intolerant to HCTZ and amlodipine.  She currently takes benzaepril 40mg  daily.  She has a home BP monitor (wrist cuff) and gets readings from 97-130/50-73.  States that she brought it to last MD visit and it was accurate.  She does not drink or smoke, having quit smoking in 2012 and complains that she gained 40 lbs when she did quit.  Has back problems and is unable to walk distances or exercise, although she states she does yoga to help with her breathing.  She eats lunch out most days, but breakfast and dinner at home, does not add salt to her foods.    Current Outpatient Prescriptions  Medication Sig Dispense Refill  . albuterol (PROVENTIL HFA;VENTOLIN HFA) 108 (90 BASE) MCG/ACT inhaler Inhale 2 puffs into the lungs every 4 (four) hours as needed for wheezing or shortness of breath.  1 Inhaler  0  . aspirin 81 MG tablet Take 160 mg by mouth daily.      Marland Kitchen atorvastatin (LIPITOR) 40 MG tablet Take 1 tablet (40 mg total) by mouth daily.  90 tablet  3  . benazepril (LOTENSIN) 40 MG tablet Take 1 tablet (40 mg total) by mouth daily.  90 tablet  3  . fish oil-omega-3 fatty acids 1000 MG capsule Take 1 g by mouth daily.        . Fluticasone-Salmeterol (ADVAIR) 250-50 MCG/DOSE AEPB Inhale 1 puff into the lungs every 12 (twelve) hours.  60 each  11  . traMADol (ULTRAM) 50 MG tablet Take 1 tablet (50 mg total) by mouth every 8 (eight) hours as needed for pain.  30 tablet  0   No current facility-administered medications for this visit.    Allergies  Allergen Reactions  . Levofloxacin     myalgia  . Amlodipine Swelling  . Levofloxacin     Dizzy and joint pains    Past Medical History  Diagnosis Date  . COPD (chronic obstructive  pulmonary disease)   . History of kidney cancer   . Hypertension   . Cancer   . Depression   . Asthma   . Glaucoma   . Hyperlipidemia     Blood pressure 150/84.  Standing BP 164/90  Weight 181.4   ASSESSMENT AND PLAN:  Her BP is at the goal set by [facility] of <150/90.  At this time I will not make any changes to her medication, however I have asked her to come back in 1 month for a follow up.  I have asked that she bring her home BP cuff at that time, as the home readings were significantly lower that what I saw in the office today.    Phillips Hay PharmD CPP [facility]

## 2013-09-24 ENCOUNTER — Encounter: Payer: Medicare Other | Admitting: Pharmacist Clinician (PhC)/ Clinical Pharmacy Specialist

## 2013-10-15 DIAGNOSIS — H35359 Cystoid macular degeneration, unspecified eye: Secondary | ICD-10-CM | POA: Diagnosis not present

## 2013-10-22 ENCOUNTER — Ambulatory Visit (INDEPENDENT_AMBULATORY_CARE_PROVIDER_SITE_OTHER): Payer: Medicare Other | Admitting: Pharmacist Clinician (PhC)/ Clinical Pharmacy Specialist

## 2013-10-22 ENCOUNTER — Encounter: Payer: Self-pay | Admitting: Pharmacist Clinician (PhC)/ Clinical Pharmacy Specialist

## 2013-10-22 VITALS — BP 132/70 | HR 80 | Ht 65.0 in | Wt 181.7 lb

## 2013-10-22 DIAGNOSIS — I10 Essential (primary) hypertension: Secondary | ICD-10-CM | POA: Diagnosis not present

## 2013-10-22 NOTE — Patient Instructions (Signed)
  Your blood pressure today is good at 132/70 Check your blood pressure at home 4-5 times per week and keep record of the readings.  Take your BP meds as follows:  AM: benazepril 40mg      Bring all of your meds, your BP cuff and your record of home blood pressures to your next appointment.  Exercise as you're able, try to walk approximately 30 minutes per day.  Keep salt intake to a minimum, especially watch canned and prepared boxed foods.  Eat more fresh fruits and vegetables and fewer canned items.  Avoid eating in fast food restaurants.    HOW TO TAKE YOUR BLOOD PRESSURE:   Rest 5 minutes before taking your blood pressure.    Don't smoke or drink caffeinated beverages for at least 30 minutes before.   Take your blood pressure before (not after) you eat.   Sit comfortably with your back supported and both feet on the floor (don't cross your legs).   Elevate your arm to heart level on a table or a desk.   Use the proper sized cuff. It should fit smoothly and snugly around your bare upper arm. There should be enough room to slip a fingertip under the cuff. The bottom edge of the cuff should be 1 inch above the crease of the elbow.   Ideally, take 3 measurements at one sitting and record the average.

## 2013-10-22 NOTE — Progress Notes (Signed)
     10/22/2013 Tammy Ball 01-28-1941 [id number]   HPI:  Tammy Ball is a 72 y.o. female patient of Dr Allyson Sabal, with a PMH below who presents today for a blood pressure check.  She states she has had problems with her BP for the past several years and is intolerant to HCTZ and amlodipine.  Amlodipine caused her to have leg swelling only after increasing to the 10mg  dose.  She currently takes benzaepril 40mg  daily.  She continues to check her BP at home with a wrist monitor, with the highest being 119 systolic and 77 diastolic.  She does not drink or smoke, having quit smoking in 2012.  Her only complaint is that she gained 40 pounds after quitting smoking.  She would like to lose these pounds, but is having trouble.   She brought her home BP cuff in for calibration today. She states compliance with medications, taking her benazepril each morning and states has not used the albuterol inhaler in many months.    Current Outpatient Prescriptions  Medication Sig Dispense Refill  . albuterol (PROVENTIL HFA;VENTOLIN HFA) 108 (90 BASE) MCG/ACT inhaler Inhale 2 puffs into the lungs every 4 (four) hours as needed for wheezing or shortness of breath.  1 Inhaler  0  . aspirin 81 MG tablet Take 160 mg by mouth daily.      Marland Kitchen atorvastatin (LIPITOR) 40 MG tablet Take 1 tablet (40 mg total) by mouth daily.  90 tablet  3  . benazepril (LOTENSIN) 40 MG tablet Take 1 tablet (40 mg total) by mouth daily.  90 tablet  3  . fish oil-omega-3 fatty acids 1000 MG capsule Take 1 g by mouth daily.        . Fluticasone-Salmeterol (ADVAIR) 250-50 MCG/DOSE AEPB Inhale 1 puff into the lungs every 12 (twelve) hours.  60 each  11  . traMADol (ULTRAM) 50 MG tablet Take 1 tablet (50 mg total) by mouth every 8 (eight) hours as needed for pain.  30 tablet  0   No current facility-administered medications for this visit.    Allergies  Allergen Reactions  . Levofloxacin     myalgia  . Amlodipine Swelling  . Levofloxacin      Dizzy and joint pains    Past Medical History  Diagnosis Date  . COPD (chronic obstructive pulmonary disease)   . History of kidney cancer   . Hypertension   . Cancer   . Depression   . Asthma   . Glaucoma   . Hyperlipidemia     Blood pressure 132/70, pulse 80, height 5\' 5"  (1.651 m), weight 181 lb 11.2 oz (82.419 kg).  Standing BP 138/80   ASSESSMENT AND PLAN:  Her blood pressure is much better today at 132/70.   [facility] guidlelines for this patient recommend readings <150/90.    She brought her home cuff into the office today for calibration.  Her meter was within 5 points of the office reading.  Therefore her home readings show her to have good control of her BP at this time.  We will make no changes to her medication regimen at this time.  She is due to follow up with Dr. Allyson Sabal in May and has been advised to call the office if she notices her BP rise consistently before then.    Phillips Hay PharmD CPP [facility]

## 2013-10-27 ENCOUNTER — Ambulatory Visit (HOSPITAL_COMMUNITY)
Admission: RE | Admit: 2013-10-27 | Discharge: 2013-10-27 | Disposition: A | Discharge status: Home / Self Care | Payer: Medicare Other | Source: Ambulatory Visit | Attending: Urology | Admitting: Urology

## 2013-10-27 ENCOUNTER — Other Ambulatory Visit (HOSPITAL_COMMUNITY): Payer: Self-pay | Admitting: Urology

## 2013-10-27 DIAGNOSIS — C649 Malignant neoplasm of unspecified kidney, except renal pelvis: Secondary | ICD-10-CM | POA: Diagnosis not present

## 2013-10-27 DIAGNOSIS — I10 Essential (primary) hypertension: Secondary | ICD-10-CM | POA: Insufficient documentation

## 2013-10-28 DIAGNOSIS — C649 Malignant neoplasm of unspecified kidney, except renal pelvis: Secondary | ICD-10-CM | POA: Diagnosis not present

## 2013-11-02 ENCOUNTER — Other Ambulatory Visit: Payer: Self-pay | Admitting: Family Medicine

## 2013-11-02 DIAGNOSIS — M549 Dorsalgia, unspecified: Secondary | ICD-10-CM

## 2013-11-02 DIAGNOSIS — J45909 Unspecified asthma, uncomplicated: Secondary | ICD-10-CM

## 2013-11-02 MED ORDER — TRAMADOL HCL 50 MG PO TABS: 50.0000 mg | ORAL_TABLET | Freq: Three times a day (TID) | ORAL | Status: DC | PRN

## 2013-11-02 MED ORDER — ALBUTEROL SULFATE HFA 108 (90 BASE) MCG/ACT IN AERS: 2.0000 | INHALATION_SPRAY | RESPIRATORY_TRACT | Status: DC | PRN

## 2013-11-03 ENCOUNTER — Other Ambulatory Visit: Payer: Self-pay

## 2013-11-09 ENCOUNTER — Encounter: Payer: Self-pay | Admitting: Family Medicine

## 2013-11-09 DIAGNOSIS — E785 Hyperlipidemia, unspecified: Secondary | ICD-10-CM

## 2013-11-09 MED ORDER — ATORVASTATIN CALCIUM 40 MG PO TABS: 40.0000 mg | ORAL_TABLET | Freq: Every day | ORAL | Status: DC

## 2013-12-02 ENCOUNTER — Other Ambulatory Visit: Payer: Self-pay | Admitting: Radiology

## 2013-12-02 ENCOUNTER — Ambulatory Visit (INDEPENDENT_AMBULATORY_CARE_PROVIDER_SITE_OTHER): Payer: Medicare Other | Admitting: Family Medicine

## 2013-12-02 ENCOUNTER — Encounter: Payer: Self-pay | Admitting: Family Medicine

## 2013-12-02 VITALS — BP 130/69 | HR 80 | Temp 98.2°F | Resp 18 | Ht 64.5 in | Wt 181.0 lb

## 2013-12-02 DIAGNOSIS — I10 Essential (primary) hypertension: Secondary | ICD-10-CM | POA: Diagnosis not present

## 2013-12-02 DIAGNOSIS — Z23 Encounter for immunization: Secondary | ICD-10-CM | POA: Diagnosis not present

## 2013-12-02 DIAGNOSIS — Z0000 Encounter for general adult medical examination without abnormal findings: Secondary | ICD-10-CM | POA: Diagnosis not present

## 2013-12-02 DIAGNOSIS — E785 Hyperlipidemia, unspecified: Secondary | ICD-10-CM | POA: Diagnosis not present

## 2013-12-02 DIAGNOSIS — J45909 Unspecified asthma, uncomplicated: Secondary | ICD-10-CM

## 2013-12-02 DIAGNOSIS — M549 Dorsalgia, unspecified: Secondary | ICD-10-CM

## 2013-12-02 LAB — CBC WITH DIFFERENTIAL/PLATELET
Basophils Absolute: 0.1 10*3/uL (ref 0.0–0.1)
Basophils Relative: 1 % (ref 0–1)
Eosinophils Absolute: 0.1 10*3/uL (ref 0.0–0.7)
Eosinophils Relative: 2 % (ref 0–5)
HCT: 42.8 % (ref 36.0–46.0)
Hemoglobin: 14.5 g/dL (ref 12.0–15.0)
Lymphocytes Relative: 34 % (ref 12–46)
Lymphs Abs: 2.3 10*3/uL (ref 0.7–4.0)
MCH: 30.9 pg (ref 26.0–34.0)
MCHC: 33.9 g/dL (ref 30.0–36.0)
MCV: 91.3 fL (ref 78.0–100.0)
Monocytes Absolute: 0.6 10*3/uL (ref 0.1–1.0)
Monocytes Relative: 9 % (ref 3–12)
Neutro Abs: 3.8 10*3/uL (ref 1.7–7.7)
Neutrophils Relative %: 54 % (ref 43–77)
Platelets: 326 10*3/uL (ref 150–400)
RBC: 4.69 MIL/uL (ref 3.87–5.11)
RDW: 15.3 % (ref 11.5–15.5)
WBC: 6.9 10*3/uL (ref 4.0–10.5)

## 2013-12-02 LAB — POCT URINALYSIS DIPSTICK
Bilirubin, UA: NEGATIVE
Glucose, UA: NEGATIVE
Ketones, UA: NEGATIVE
Leukocytes, UA: NEGATIVE
Nitrite, UA: NEGATIVE
Protein, UA: NEGATIVE
Spec Grav, UA: 1.01
Urobilinogen, UA: 0.2
pH, UA: 6

## 2013-12-02 LAB — COMPLETE METABOLIC PANEL WITH GFR
ALT: 15 U/L (ref 0–35)
AST: 20 U/L (ref 0–37)
Albumin: 4.4 g/dL (ref 3.5–5.2)
Alkaline Phosphatase: 95 U/L (ref 39–117)
BUN: 20 mg/dL (ref 6–23)
CO2: 26 mEq/L (ref 19–32)
Calcium: 9.6 mg/dL (ref 8.4–10.5)
Chloride: 102 mEq/L (ref 96–112)
Creat: 1.08 mg/dL (ref 0.50–1.10)
GFR, Est African American: 60 mL/min
GFR, Est Non African American: 52 mL/min — ABNORMAL LOW
Glucose, Bld: 105 mg/dL — ABNORMAL HIGH (ref 70–99)
Potassium: 5 mEq/L (ref 3.5–5.3)
Sodium: 138 mEq/L (ref 135–145)
Total Bilirubin: 0.5 mg/dL (ref 0.3–1.2)
Total Protein: 7 g/dL (ref 6.0–8.3)

## 2013-12-02 LAB — LIPID PANEL
Cholesterol: 172 mg/dL (ref 0–200)
HDL: 71 mg/dL (ref >39)
LDL Cholesterol: 80 mg/dL (ref 0–99)
Total CHOL/HDL Ratio: 2.4 Ratio
Triglycerides: 106 mg/dL (ref <150)
VLDL: 21 mg/dL (ref 0–40)

## 2013-12-02 LAB — POCT UA - MICROSCOPIC ONLY
Bacteria, U Microscopic: NEGATIVE
Casts, Ur, LPF, POC: NEGATIVE
Crystals, Ur, HPF, POC: NEGATIVE
Mucus, UA: NEGATIVE
WBC, Ur, HPF, POC: NEGATIVE
Yeast, UA: NEGATIVE

## 2013-12-02 MED ORDER — FLUTICASONE-SALMETEROL 250-50 MCG/DOSE IN AEPB: 1.0000 | INHALATION_SPRAY | Freq: Two times a day (BID) | RESPIRATORY_TRACT | Status: DC

## 2013-12-02 MED ORDER — BENAZEPRIL HCL 40 MG PO TABS: 40.0000 mg | ORAL_TABLET | Freq: Every day | ORAL | Status: DC

## 2013-12-02 MED ORDER — ALBUTEROL SULFATE HFA 108 (90 BASE) MCG/ACT IN AERS: 2.0000 | INHALATION_SPRAY | RESPIRATORY_TRACT | Status: DC | PRN

## 2013-12-02 MED ORDER — TRAMADOL HCL 50 MG PO TABS: 50.0000 mg | ORAL_TABLET | Freq: Three times a day (TID) | ORAL | Status: DC | PRN

## 2013-12-02 MED ORDER — ATORVASTATIN CALCIUM 40 MG PO TABS: 40.0000 mg | ORAL_TABLET | Freq: Every day | ORAL | Status: DC

## 2013-12-02 NOTE — Progress Notes (Addendum)
   Subjective:    Patient ID: Tammy Ball, female    DOB: [birth year], 73 y.o.   MRN: [id number]  H[place].  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  Here for CPE   Review of Systems  Musculoskeletal: Positive for arthralgias and back pain.  Allergic/Immunologic: Positive for environmental allergies.       Objective:   Physical Exam Elderly woman in no acute distress, pleasant, articulate HEENT: Unremarkable except for mild optic disc atrophy. Hearing is grossly normal. Patient has upper dentures. Neck: Supple no adenopathy or bruits, no thyromegaly Chest: Clear Breast exam: Normal Heart: Regular no murmur Abdomen: Soft nontender without HSM Extremities: No edema, excellent pulses, no suspicious lesions Neuro: Good knee reflexes, stable gait, normal cranial nerves III through XII     Assessment & Plan:  No significant change in health. Up-to-date on all health maintenance issues except for technical shot  Routine general medical examination at a health care facility - Plan: POCT UA - Microscopic Only, POCT urinalysis dipstick, Tdap vaccine greater than or equal to 7yo IM, CBC with Differential, Lipid panel, COMPLETE METABOLIC PANEL WITH GFR  Need for Tdap vaccination - Plan: Tdap vaccine greater than or equal to 7yo IM  Hyperlipemia - Plan: Lipid panel, benazepril (LOTENSIN) 40 MG tablet  HTN (hypertension) - Plan: CBC with Differential, COMPLETE METABOLIC PANEL WITH GFR  Asthma - Plan: albuterol (PROVENTIL HFA;VENTOLIN HFA) 108 (90 BASE) MCG/ACT inhaler, Fluticasone-Salmeterol (ADVAIR) 250-50 MCG/DOSE AEPB  Hyperlipidemia - Plan: atorvastatin (LIPITOR) 40 MG tablet  Back pain - Plan: traMADol (ULTRAM) 50 MG tablet  Signed, [name], MD  Addendum:  I discussed patient affect with her.  She is not exhibiting signs of or suggesting symptoms of depression

## 2014-02-11 ENCOUNTER — Other Ambulatory Visit: Payer: Self-pay | Admitting: Family Medicine

## 2014-02-11 DIAGNOSIS — E785 Hyperlipidemia, unspecified: Secondary | ICD-10-CM

## 2014-02-11 DIAGNOSIS — M549 Dorsalgia, unspecified: Secondary | ICD-10-CM

## 2014-02-11 MED ORDER — ATORVASTATIN CALCIUM 40 MG PO TABS: 40.0000 mg | ORAL_TABLET | Freq: Every day | ORAL | Status: DC

## 2014-02-12 MED ORDER — TRAMADOL HCL 50 MG PO TABS: 50.0000 mg | ORAL_TABLET | Freq: Three times a day (TID) | ORAL | Status: DC | PRN

## 2014-02-12 NOTE — Telephone Encounter (Signed)
Called in.

## 2014-02-12 NOTE — Telephone Encounter (Signed)
Called in and pt notified in [place]

## 2014-02-26 DIAGNOSIS — Z1231 Encounter for screening mammogram for malignant neoplasm of breast: Secondary | ICD-10-CM | POA: Diagnosis not present

## 2014-02-26 DIAGNOSIS — Z803 Family history of malignant neoplasm of breast: Secondary | ICD-10-CM | POA: Diagnosis not present

## 2014-02-26 LAB — HM MAMMOGRAPHY

## 2014-03-02 ENCOUNTER — Encounter: Payer: Self-pay | Admitting: *Deleted

## 2014-03-22 ENCOUNTER — Encounter: Payer: Self-pay | Admitting: Family Medicine

## 2014-05-13 DIAGNOSIS — H04129 Dry eye syndrome of unspecified lacrimal gland: Secondary | ICD-10-CM | POA: Diagnosis not present

## 2014-05-13 DIAGNOSIS — H2510 Age-related nuclear cataract, unspecified eye: Secondary | ICD-10-CM | POA: Diagnosis not present

## 2014-05-14 ENCOUNTER — Other Ambulatory Visit: Payer: Self-pay | Admitting: Family Medicine

## 2014-05-19 ENCOUNTER — Other Ambulatory Visit: Payer: Self-pay | Admitting: Family Medicine

## 2014-06-01 ENCOUNTER — Encounter: Payer: Self-pay | Admitting: Family Medicine

## 2014-06-13 ENCOUNTER — Other Ambulatory Visit: Payer: Self-pay | Admitting: Family Medicine

## 2014-07-08 ENCOUNTER — Ambulatory Visit (INDEPENDENT_AMBULATORY_CARE_PROVIDER_SITE_OTHER): Payer: Medicare Other | Admitting: Family Medicine

## 2014-07-08 VITALS — BP 154/84 | HR 82 | Temp 98.2°F | Resp 16 | Ht 64.0 in | Wt 193.2 lb

## 2014-07-08 DIAGNOSIS — J209 Acute bronchitis, unspecified: Secondary | ICD-10-CM | POA: Diagnosis not present

## 2014-07-08 DIAGNOSIS — I10 Essential (primary) hypertension: Secondary | ICD-10-CM

## 2014-07-08 DIAGNOSIS — E785 Hyperlipidemia, unspecified: Secondary | ICD-10-CM | POA: Diagnosis not present

## 2014-07-08 MED ORDER — BENAZEPRIL HCL 40 MG PO TABS: 40.0000 mg | ORAL_TABLET | Freq: Every day | ORAL | Status: DC

## 2014-07-08 MED ORDER — MOMETASONE FURO-FORMOTEROL FUM 200-5 MCG/ACT IN AERO: 2.0000 | INHALATION_SPRAY | Freq: Two times a day (BID) | RESPIRATORY_TRACT | Status: DC

## 2014-07-08 MED ORDER — ATORVASTATIN CALCIUM 40 MG PO TABS: 40.0000 mg | ORAL_TABLET | Freq: Every day | ORAL | Status: DC

## 2014-07-08 MED ORDER — AZITHROMYCIN 250 MG PO TABS: ORAL_TABLET | ORAL | Status: DC

## 2014-07-08 MED ORDER — HYDROCOD POLST-CHLORPHEN POLST 10-8 MG/5ML PO LQCR: 5.0000 mL | Freq: Two times a day (BID) | ORAL | Status: DC | PRN

## 2014-07-08 NOTE — Progress Notes (Signed)
This chart was scribed for [name], MD by Ladene Artist, ED Scribe. The patient was seen in room 2. Patient's care was started at 10:42 AM.  Patient ID: Tammy Ball MRN: [id number], DOB: 10/19/1941, 73 y.o. Date of Encounter: 07/08/2014, 10:42 AM  Primary Physician: [name], MD  Chief Complaint  Patient presents with   Cough    x 2 days clear-yellow sputum   Sore Throat    x 2 weeks    Sinus Problem    drainage   HPI: 73 y.o. year old female with history below presents with constant sore throat onset 2 weeks ago. She reports associated productive cough onset yesterday with clear-yellow sputum, rhinorrhea, SOB, wheezing, night sweats. She denies fever. Pt used her inhaler yesterday with mild relief. She states that this was her first time using her inhaler this year.   Past Medical History  Diagnosis Date   COPD (chronic obstructive pulmonary disease)    History of kidney cancer    Hypertension    Cancer    Depression    Asthma    Glaucoma    Hyperlipidemia    Heart murmur      Home Meds: Prior to Admission medications   Medication Sig Start Date End Date Taking? Authorizing Provider  albuterol (PROVENTIL HFA;VENTOLIN HFA) 108 (90 BASE) MCG/ACT inhaler Inhale 2 puffs into the lungs every 4 (four) hours as needed for wheezing or shortness of breath. 12/02/13 12/02/14 Yes [name], MD  aspirin 81 MG tablet Take 160 mg by mouth daily.   Yes Historical Provider, MD  benazepril (LOTENSIN) 40 MG tablet Take 1 tablet (40 mg total) by mouth daily. 12/02/13  Yes [name], MD  fish oil-omega-3 fatty acids 1000 MG capsule Take 1 g by mouth daily.     Yes Historical Provider, MD  atorvastatin (LIPITOR) 40 MG tablet Take 1 tablet (40 mg total) by mouth daily. PATIENT NEEDS OFFICE VISIT FOR ADDITIONAL REFILLS - 2nd NOTICE    [name], MD    Allergies:  Allergies  Allergen Reactions   Levofloxacin     myalgia   Amlodipine Swelling    Hctz [Hydrochlorothiazide]    Levofloxacin     Dizzy and joint pains    History   Social History   Marital Status: Widowed    Spouse Name: N/A    Number of Children: N/A   Years of Education: N/A   Occupational History   Not on file.   Social History Main Topics   Smoking status: Former Smoker    Types: Cigarettes    Quit date: 03/16/2011   Smokeless tobacco: Not on file   Alcohol Use: No   Drug Use: No   Sexual Activity: Not Currently   Other Topics Concern   Not on file   Social History Narrative   Occupation: Substance Abuse Counselor   Divorced   College Education   No exercise** Merged History Encounter **       ** Data from: 12/14/11 Enc Dept: [facility]       ** Data from: 12/17/11 Enc Dept: [facility]   Substance abuse counselor   Husband deceased   6 great grandchildren   Son works in same substance abuse counseling center as patient     Review of Systems: Constitutional: negative for chills, fever, weight changes, or fatigue, +night sweats HEENT: negative for vision changes, hearing loss, congestion, epistaxis, or sinus pressure, +sore throat, +rhinorrhea Cardiovascular: negative for chest  pain or palpitations Respiratory: negative for hemoptysis, +wheezing, +shortness of breath, +cough Abdominal: negative for abdominal pain, nausea, vomiting, diarrhea, or constipation Dermatological: negative for rash Neurologic: negative for headache, dizziness, or syncope All other systems reviewed and are otherwise negative with the exception to those above and in the HPI.   Physical Exam: Blood pressure 154/84, pulse 82, temperature 98.2 F (36.8 C), temperature source Oral, resp. rate 16, height 5\' 4"  (1.626 m), weight 193 lb 3.2 oz (87.635 kg), SpO2 92.00%., Body mass index is 33.15 kg/(m^2). General: Well developed, well nourished, in no acute distress. Head: Normocephalic, atraumatic, eyes without discharge, sclera non-icteric,  nares are without discharge. Bilateral auditory canals clear, TM's are without perforation, pearly grey and translucent with reflective cone of light bilaterally. Oral cavity moist, posterior pharynx without exudate, peritonsillar abscess, or post nasal drip. Posterior pharynx erythema,  Neck: Supple. No thyromegaly. Full ROM. No lymphadenopathy. Lungs: Clear bilaterally to auscultation without wheezes, rales, or rhonchi. Breathing is unlabored. Heart: RRR with S1 S2. No murmurs, rubs, or gallops appreciated. Abdomen: Soft, non-tender, non-distended with normoactive bowel sounds. No hepatomegaly. No rebound/guarding. No obvious abdominal masses. Msk:  Strength and tone normal for age. Extremities/Skin: Warm and dry. No clubbing or cyanosis. No edema. No rashes or suspicious lesions. Neuro: Alert and oriented X 3. Moves all extremities spontaneously. Gait is normal. CNII-XII grossly in tact. Psych:  Responds to questions appropriately with a normal affect.    ASSESSMENT AND PLAN:  73 y.o. year old female with   1. Acute bronchitis, unspecified organism   2. Hyperlipidemia   3. Hyperlipemia   Acute bronchitis, unspecified organism - Plan: azithromycin (ZITHROMAX Z-PAK) 250 MG tablet, chlorpheniramine-HYDROcodone (TUSSIONEX PENNKINETIC ER) 10-8 MG/5ML LQCR, mometasone-formoterol (DULERA) 200-5 MCG/ACT AERO  Hyperlipidemia - Plan: atorvastatin (LIPITOR) 40 MG tablet  Hyperlipemia  Essential hypertension - Plan: benazepril (LOTENSIN) 40 MG tablet   Signed, [name], MD 07/08/2014 10:42 AM

## 2014-08-18 DIAGNOSIS — Z23 Encounter for immunization: Secondary | ICD-10-CM | POA: Diagnosis not present

## 2014-09-30 ENCOUNTER — Encounter: Payer: Self-pay | Admitting: Family Medicine

## 2014-10-27 ENCOUNTER — Other Ambulatory Visit (HOSPITAL_COMMUNITY): Payer: Self-pay | Admitting: Urology

## 2014-10-27 ENCOUNTER — Ambulatory Visit (HOSPITAL_COMMUNITY)
Admission: RE | Admit: 2014-10-27 | Discharge: 2014-10-27 | Disposition: A | Discharge status: Home / Self Care | Payer: Medicare Other | Source: Ambulatory Visit | Attending: Urology | Admitting: Urology

## 2014-10-27 DIAGNOSIS — C649 Malignant neoplasm of unspecified kidney, except renal pelvis: Secondary | ICD-10-CM

## 2014-10-27 DIAGNOSIS — Z85528 Personal history of other malignant neoplasm of kidney: Secondary | ICD-10-CM | POA: Diagnosis not present

## 2014-10-27 DIAGNOSIS — I10 Essential (primary) hypertension: Secondary | ICD-10-CM | POA: Diagnosis not present

## 2014-10-27 DIAGNOSIS — R0602 Shortness of breath: Secondary | ICD-10-CM | POA: Diagnosis not present

## 2014-11-03 DIAGNOSIS — C649 Malignant neoplasm of unspecified kidney, except renal pelvis: Secondary | ICD-10-CM | POA: Diagnosis not present

## 2014-11-11 DIAGNOSIS — H26493 Other secondary cataract, bilateral: Secondary | ICD-10-CM | POA: Diagnosis not present

## 2014-11-11 DIAGNOSIS — H524 Presbyopia: Secondary | ICD-10-CM | POA: Diagnosis not present

## 2014-11-30 ENCOUNTER — Encounter: Payer: Self-pay | Admitting: Family Medicine

## 2014-12-30 ENCOUNTER — Encounter: Payer: Self-pay | Admitting: Family Medicine

## 2014-12-30 DIAGNOSIS — C649 Malignant neoplasm of unspecified kidney, except renal pelvis: Secondary | ICD-10-CM

## 2014-12-30 NOTE — Assessment & Plan Note (Signed)
[facility] 11/03/14 Carolan Clines, MD Assessment: 1.  Renal cell carcinoma, unspecified laterally  74 year old female with hx of L renal carcinoma, post nephrectomy in 2012.  She will begin aerobic and weight bearing exercise and take ca** / vit jD3 combination.

## 2015-02-03 ENCOUNTER — Other Ambulatory Visit: Payer: Self-pay | Admitting: Family Medicine

## 2015-03-21 ENCOUNTER — Ambulatory Visit (INDEPENDENT_AMBULATORY_CARE_PROVIDER_SITE_OTHER): Payer: Medicare Other | Admitting: Internal Medicine

## 2015-03-21 VITALS — BP 152/92 | HR 76 | Temp 98.0°F | Resp 17 | Ht 64.5 in | Wt 190.0 lb

## 2015-03-21 DIAGNOSIS — J45909 Unspecified asthma, uncomplicated: Secondary | ICD-10-CM | POA: Diagnosis not present

## 2015-03-21 DIAGNOSIS — G4700 Insomnia, unspecified: Secondary | ICD-10-CM | POA: Diagnosis not present

## 2015-03-21 DIAGNOSIS — R05 Cough: Secondary | ICD-10-CM

## 2015-03-21 DIAGNOSIS — J209 Acute bronchitis, unspecified: Secondary | ICD-10-CM

## 2015-03-21 DIAGNOSIS — R059 Cough, unspecified: Secondary | ICD-10-CM

## 2015-03-21 MED ORDER — ALBUTEROL SULFATE (2.5 MG/3ML) 0.083% IN NEBU
2.5000 mg | INHALATION_SOLUTION | Freq: Once | RESPIRATORY_TRACT | Status: AC
  Administered 2015-03-21: 2.5 mg via RESPIRATORY_TRACT

## 2015-03-21 MED ORDER — AZITHROMYCIN 500 MG PO TABS: 500.0000 mg | ORAL_TABLET | Freq: Every day | ORAL | Status: DC

## 2015-03-21 MED ORDER — HYDROCODONE-ACETAMINOPHEN 7.5-325 MG/15ML PO SOLN: 10.0000 mL | Freq: Four times a day (QID) | ORAL | Status: DC | PRN

## 2015-03-21 MED ORDER — IPRATROPIUM BROMIDE 0.02 % IN SOLN
0.5000 mg | Freq: Once | RESPIRATORY_TRACT | Status: AC
  Administered 2015-03-21: 0.5 mg via RESPIRATORY_TRACT

## 2015-03-21 NOTE — Patient Instructions (Signed)
Chronic Asthmatic Bronchitis Chronic asthmatic bronchitis is a complication of persistent asthma. After a period of time with asthma, some people develop airflow obstruction that is present all the time, even when not having an asthma attack.There is also persistent inflammation of the airways, and the bronchial tubes produce more mucus. Chronic asthmatic bronchitis usually is a permanent problem with the lungs. CAUSES  Chronic asthmatic bronchitis happens most often in people who have asthma and also smoke cigarettes. Occasionally, it can happen to a person with long-standing or severe asthma even if the person is not a smoker. SIGNS AND SYMPTOMS  Chronic asthmatic bronchitis usually causes symptoms of both asthma and chronic bronchitis, including:   Coughing.  Increased sputum production.  Wheezing and shortness of breath.  Chest discomfort.  Recurring infections. DIAGNOSIS  Your health care provider will take a medical history and perform a physical exam. Chronic asthmatic bronchitis is suspected when a person with asthma has abnormal results on breathing tests (pulmonary function tests) even when breathing symptoms are at their best. Other tests, such as a chest X-ray, may be performed to rule out other conditions.  TREATMENT  Treatment involves controlling symptoms with medicine and lifestyle changes.  Your health care provider may prescribe asthma medicines, including inhaler and nebulizer medicines.  Infection can be treated with medicine to kill germs (antibiotics). Serious infections may require hospitalization. These can include:  Pneumonia.  Sinus infections.  Acute bronchitis.   Preventing infection and hospitalization is very important. Get an influenza vaccination every year as directed by your health care provider. Ask your health care provider whether you need a pneumonia vaccine.  Ask your health care provider whether you would benefit from a pulmonary  rehabilitation program. HOME CARE INSTRUCTIONS  Take medicines only as directed by your health care provider.  If you are a cigarette smoker, the most important thing that you can do is quit. Talk to your health care provider for help with quitting smoking.  Avoid pollen, dust, animal dander, molds, smoke, and other things that cause attacks.  Regular exercise is very important to help you feel better. Discuss possible exercise routines with your health care provider.  If animal dander is the cause of asthma, you may not be able to keep pets.  It is important that you:  Become educated about your medical condition.  Participate in maintaining wellness.  Seek medical care as directed. Delay in seeking medical care could cause permanent injury and may be a risk to your life. SEEK MEDICAL CARE IF:  You have wheezing and shortness of breath even if taking medicine to prevent attacks.  You have muscle aches, chest pain, or thickening of sputum.  Your sputum changes from clear or white to yellow, green, gray, or bloody. SEEK IMMEDIATE MEDICAL CARE IF:  Your usual medicines do not stop your wheezing.  You have increased coughing or shortness of breath or both.  You have increased difficulty breathing.  You have any problems from the medicine you are taking, such as a rash, itching, swelling, or trouble breathing. MAKE SURE YOU:   Understand these instructions.  Will watch your condition.  Will get help right away if you are not doing well or get worse. Document Released: 08/16/2006 Document Revised: 03/15/2014 Document Reviewed: 12/07/2013 Exit[employer] Patient Information 2015 [employer]. This information is not intended to replace advice given to you by your health care provider. Make sure you discuss any questions you have with your health care provider.  

## 2015-03-21 NOTE — Progress Notes (Signed)
   Subjective:    Patient ID: Tammy Ball, female    DOB: [birth year], 75 y.o.   MRN: [id number]  HPI This chart was scribed by Benetta Spar CMA for Dr. Elder Cyphers.  Patient stated she has had a sore throat and cough for the past 2 weeks. The sore throat is now gone, but she is still coughing. She is congested and the congestion has started to move down to her chest.  Sputum is dark, only wheezes at night, no cats, no feathers. Has her inhalers and using nasocort just recently. No cp, minimal sob.  Review of Systems     Objective:   Physical Exam  Constitutional: She is oriented to person, place, and time. She appears well-developed and well-nourished. No distress.  HENT:  Head: Normocephalic.  Right Ear: External ear normal.  Left Ear: External ear normal.  Nose: Nose normal.  Mouth/Throat: Oropharynx is clear and moist.  Eyes: EOM are normal.  Neck: Normal range of motion. Neck supple.  Cardiovascular: Normal rate.   Pulmonary/Chest: Effort normal. No tachypnea. No respiratory distress. She has no decreased breath sounds. She has wheezes. She has rhonchi. She has no rales. She exhibits no tenderness.  Neurological: She is alert and oriented to person, place, and time. She exhibits normal muscle tone. Coordination normal.  Psychiatric: She has a normal mood and affect. Her behavior is normal.  Vitals reviewed.  Nebulizer improved       Assessment & Plan:  Cough/Bronchospasm Asthmatic bronchitis

## 2015-05-12 DIAGNOSIS — H26493 Other secondary cataract, bilateral: Secondary | ICD-10-CM | POA: Diagnosis not present

## 2015-05-12 DIAGNOSIS — H524 Presbyopia: Secondary | ICD-10-CM | POA: Diagnosis not present

## 2015-05-12 DIAGNOSIS — H26491 Other secondary cataract, right eye: Secondary | ICD-10-CM | POA: Diagnosis not present

## 2015-05-17 ENCOUNTER — Other Ambulatory Visit: Payer: Self-pay | Admitting: Family Medicine

## 2015-05-17 DIAGNOSIS — Z1231 Encounter for screening mammogram for malignant neoplasm of breast: Secondary | ICD-10-CM | POA: Diagnosis not present

## 2015-05-17 DIAGNOSIS — J45909 Unspecified asthma, uncomplicated: Secondary | ICD-10-CM

## 2015-05-17 DIAGNOSIS — M8580 Other specified disorders of bone density and structure, unspecified site: Secondary | ICD-10-CM | POA: Diagnosis not present

## 2015-05-18 ENCOUNTER — Other Ambulatory Visit: Payer: Self-pay

## 2015-05-18 MED ORDER — ALBUTEROL SULFATE HFA 108 (90 BASE) MCG/ACT IN AERS: 2.0000 | INHALATION_SPRAY | RESPIRATORY_TRACT | Status: DC | PRN

## 2015-05-18 NOTE — Addendum Note (Signed)
Addended by: Jannette Spanner on: 05/18/2015 01:46 PM   Modules accepted: Orders

## 2015-05-27 ENCOUNTER — Encounter: Payer: Self-pay | Admitting: Family Medicine

## 2015-06-01 ENCOUNTER — Encounter: Payer: Self-pay | Admitting: Family Medicine

## 2015-06-23 DIAGNOSIS — H26492 Other secondary cataract, left eye: Secondary | ICD-10-CM | POA: Diagnosis not present

## 2015-07-23 ENCOUNTER — Encounter: Payer: Self-pay | Admitting: Family Medicine

## 2015-07-23 ENCOUNTER — Other Ambulatory Visit: Payer: Self-pay | Admitting: Family Medicine

## 2015-07-23 DIAGNOSIS — Z23 Encounter for immunization: Secondary | ICD-10-CM | POA: Diagnosis not present

## 2015-07-25 ENCOUNTER — Encounter: Payer: Self-pay | Admitting: Family Medicine

## 2015-07-30 ENCOUNTER — Encounter: Payer: Self-pay | Admitting: Emergency Medicine

## 2015-07-30 ENCOUNTER — Ambulatory Visit (INDEPENDENT_AMBULATORY_CARE_PROVIDER_SITE_OTHER): Payer: Medicare Other | Admitting: Emergency Medicine

## 2015-07-30 VITALS — BP 110/68 | HR 93 | Temp 98.9°F | Resp 16 | Ht 64.5 in | Wt 183.1 lb

## 2015-07-30 DIAGNOSIS — J0140 Acute pansinusitis, unspecified: Secondary | ICD-10-CM | POA: Diagnosis not present

## 2015-07-30 DIAGNOSIS — J209 Acute bronchitis, unspecified: Secondary | ICD-10-CM

## 2015-07-30 MED ORDER — PSEUDOEPHEDRINE-GUAIFENESIN ER 60-600 MG PO TB12: 1.0000 | ORAL_TABLET | Freq: Every day | ORAL | Status: DC

## 2015-07-30 MED ORDER — AMOXICILLIN-POT CLAVULANATE 875-125 MG PO TABS: 1.0000 | ORAL_TABLET | Freq: Two times a day (BID) | ORAL | Status: DC

## 2015-07-30 MED ORDER — HYDROCOD POLST-CPM POLST ER 10-8 MG/5ML PO SUER: 5.0000 mL | Freq: Two times a day (BID) | ORAL | Status: DC

## 2015-07-30 NOTE — Progress Notes (Signed)
Subjective:  Patient ID: Tammy Ball, female    DOB: 1941-08-13  Age: 74 y.o. MRN: [id number]  CC: Sore Throat and Fever   HPI Tammy Ball presents  with nasal congestion nasal discharge or postnasal drip. She has a cough and some wheezing. Denies any shortness of breath. She has a fever last night 200. She is lying mucopurulent mucus out of her nose. And she has a cough productive of mucopurulent sputum she has pressure cheeks and a sore throat. She's convinced that she has a red throat with white spots. And is concerned about . Had no improvement with over-the-counter medication  History Kylar has a past medical history of COPD (chronic obstructive pulmonary disease); History of kidney cancer; Hypertension; Cancer; Depression; Asthma; Glaucoma; Hyperlipidemia; and Heart murmur.   She has past surgical history that includes Nephrectomy; kidney cancer; Tubal ligation; Spine surgery; and Eye surgery.   Her  family history includes Birth defects in her sister; Breast cancer in her sister; Cancer in her maternal grandmother and mother; Glaucoma in her daughter; Heart disease in her daughter, mother, and sister; Hypertension in her mother; Obesity in her brother.  She   reports that she quit smoking about 4 years ago. Her smoking use included Cigarettes. She has never used smokeless tobacco. She reports that she does not drink alcohol or use illicit drugs.  Outpatient Prescriptions Prior to Visit  Medication Sig Dispense Refill  . albuterol (PROVENTIL HFA;VENTOLIN HFA) 108 (90 BASE) MCG/ACT inhaler Inhale 2 puffs into the lungs every 4 (four) hours as needed for wheezing or shortness of breath. 1 Inhaler 0  . aspirin 81 MG tablet Take 81 mg by mouth daily.     Marland Kitchen atorvastatin (LIPITOR) 40 MG tablet TAKE ONE TABLET BY MOUTH ONCE DAILY.  "OV NEEDED FOR REFILLS" 30 tablet 0  . benazepril (LOTENSIN) 40 MG tablet Take 1 tablet (40 mg total) by mouth daily. 90 tablet 3  . fish oil-omega-3  fatty acids 1000 MG capsule Take 1 g by mouth daily.      . mometasone-formoterol (DULERA) 200-5 MCG/ACT AERO Inhale 2 puffs into the lungs 2 (two) times daily. 1 Inhaler 11  . azithromycin (ZITHROMAX) 500 MG tablet Take 1 tablet (500 mg total) by mouth daily. 5 tablet 0  . calcium-vitamin D (OSCAL WITH D) 500-200 MG-UNIT per tablet Take 1 tablet by mouth.    Marland Kitchen HYDROcodone-acetaminophen (HYCET) 7.5-325 mg/15 ml solution Take 10 mLs by mouth every 6 (six) hours as needed (or cough). 240 mL 0   No facility-administered medications prior to visit.    Social History   Social History  . Marital Status: Widowed    Spouse Name: N/A  . Number of Children: N/A  . Years of Education: N/A   Social History Main Topics  . Smoking status: Former Smoker    Types: Cigarettes    Quit date: 03/16/2011  . Smokeless tobacco: Never Used  . Alcohol Use: No  . Drug Use: No  . Sexual Activity: Not Currently   Other Topics Concern  . None   Social History Narrative   Occupation: Substance Abuse Estate agent   No exercise** Merged History Encounter **       ** Data from: 12/14/11 Enc Dept: [facility]       ** Data from: 12/17/11 Enc Dept: [facility]   Substance abuse counselor   Husband deceased   4 great grandchildren  Son works in same substance abuse counseling center as patient     Review of Systems  Constitutional: Negative for fever, chills and appetite change.  HENT: Positive for congestion, facial swelling, postnasal drip, rhinorrhea, sinus pressure and sore throat. Negative for ear pain.   Eyes: Negative for pain and redness.  Respiratory: Positive for cough and wheezing. Negative for shortness of breath.   Cardiovascular: Negative for leg swelling.  Gastrointestinal: Negative for nausea, vomiting, abdominal pain, diarrhea, constipation and blood in stool.  Endocrine: Negative for polyuria.  Genitourinary: Negative for dysuria, urgency,  frequency and flank pain.  Musculoskeletal: Negative for gait problem.  Skin: Negative for rash.  Neurological: Negative for weakness and headaches.  Psychiatric/Behavioral: Negative for confusion and decreased concentration. The patient is not nervous/anxious.     Objective:  BP 110/68 mmHg  Pulse 93  Temp(Src) 98.9 F (37.2 C) (Oral)  Resp 16  Ht 5' 4.5" (1.638 m)  Wt 183 lb 2 oz (83.065 kg)  BMI 30.96 kg/m2  SpO2 97%  Physical Exam  Constitutional: She is oriented to person, place, and time. She appears well-developed and well-nourished. No distress.  HENT:  Head: Normocephalic and atraumatic.  Right Ear: External ear normal.  Left Ear: External ear normal.  Nose: Nose normal.  Eyes: Conjunctivae and EOM are normal. Pupils are equal, round, and reactive to light. No scleral icterus.  Neck: Normal range of motion. Neck supple. No tracheal deviation present.  Cardiovascular: Normal rate, regular rhythm and normal heart sounds.   Pulmonary/Chest: Effort normal. No respiratory distress. She has no wheezes. She has no rales.  Abdominal: She exhibits no mass. There is no tenderness. There is no rebound and no guarding.  Musculoskeletal: She exhibits no edema.  Lymphadenopathy:    She has no cervical adenopathy.  Neurological: She is alert and oriented to person, place, and time. Coordination normal.  Skin: Skin is warm and dry. No rash noted.  Psychiatric: She has a normal mood and affect. Her behavior is normal.      Assessment & Plan:   Tammy Ball was seen today for sore throat and fever.  Diagnoses and all orders for this visit:  Acute bronchitis, unspecified organism  Acute pansinusitis, recurrence not specified  Other orders -     amoxicillin-clavulanate (AUGMENTIN) 875-125 MG per tablet; Take 1 tablet by mouth 2 (two) times daily. -     pseudoephedrine-guaifenesin (MUCINEX D) 60-600 MG per tablet; Take 1 tablet by mouth daily. -     chlorpheniramine-HYDROcodone  (TUSSIONEX PENNKINETIC ER) 10-8 MG/5ML SUER; Take 5 mLs by mouth 2 (two) times daily.  I have discontinued Ms. Amspacher's calcium-vitamin D, azithromycin, and HYDROcodone-acetaminophen. I am also having her start on amoxicillin-clavulanate, pseudoephedrine-guaifenesin, and chlorpheniramine-HYDROcodone. Additionally, I am having her maintain her fish oil-omega-3 fatty acids, aspirin, benazepril, mometasone-formoterol, albuterol, atorvastatin, Cholecalciferol (VITAMIN D-3 PO), and KRILL OIL PO.  Meds ordered this encounter  Medications  . Cholecalciferol (VITAMIN D-3 PO)    Sig: Take 1 tablet by mouth daily.  Marland Kitchen KRILL OIL PO    Sig: Take 1 capsule by mouth every morning.  Marland Kitchen amoxicillin-clavulanate (AUGMENTIN) 875-125 MG per tablet    Sig: Take 1 tablet by mouth 2 (two) times daily.    Dispense:  20 tablet    Refill:  0  . pseudoephedrine-guaifenesin (MUCINEX D) 60-600 MG per tablet    Sig: Take 1 tablet by mouth daily.    Dispense:  18 tablet    Refill:  0  . chlorpheniramine-HYDROcodone (  TUSSIONEX PENNKINETIC ER) 10-8 MG/5ML SUER    Sig: Take 5 mLs by mouth 2 (two) times daily.    Dispense:  60 mL    Refill:  0    Appropriate red flag conditions were discussed with the patient as well as actions that should be taken.  Patient expressed his understanding.  Follow-up: Return if symptoms worsen or fail to improve.  Roselee Culver, MD

## 2015-07-30 NOTE — Patient Instructions (Signed)

## 2015-08-27 ENCOUNTER — Other Ambulatory Visit: Payer: Self-pay | Admitting: Physician Assistant

## 2015-08-28 MED ORDER — ATORVASTATIN CALCIUM 40 MG PO TABS: ORAL_TABLET | ORAL | Status: DC

## 2015-08-29 ENCOUNTER — Encounter: Payer: Self-pay | Admitting: Family Medicine

## 2015-08-29 ENCOUNTER — Other Ambulatory Visit: Payer: Self-pay

## 2015-08-29 DIAGNOSIS — I10 Essential (primary) hypertension: Secondary | ICD-10-CM

## 2015-08-29 DIAGNOSIS — J209 Acute bronchitis, unspecified: Secondary | ICD-10-CM

## 2015-08-29 MED ORDER — MOMETASONE FURO-FORMOTEROL FUM 200-5 MCG/ACT IN AERO: 2.0000 | INHALATION_SPRAY | Freq: Two times a day (BID) | RESPIRATORY_TRACT | Status: DC

## 2015-08-29 MED ORDER — BENAZEPRIL HCL 40 MG PO TABS: 40.0000 mg | ORAL_TABLET | Freq: Every day | ORAL | Status: DC

## 2015-09-08 ENCOUNTER — Inpatient Hospital Stay (HOSPITAL_COMMUNITY)
Admission: EM | Admit: 2015-09-08 | Discharge: 2015-09-13 | DRG: 871 | Disposition: A | Discharge status: Home / Self Care | Payer: Medicare Other | Attending: Internal Medicine | Admitting: Internal Medicine

## 2015-09-08 ENCOUNTER — Emergency Department (HOSPITAL_COMMUNITY): Payer: Medicare Other

## 2015-09-08 ENCOUNTER — Encounter (HOSPITAL_COMMUNITY): Payer: Self-pay | Admitting: *Deleted

## 2015-09-08 DIAGNOSIS — Z8249 Family history of ischemic heart disease and other diseases of the circulatory system: Secondary | ICD-10-CM

## 2015-09-08 DIAGNOSIS — J209 Acute bronchitis, unspecified: Secondary | ICD-10-CM | POA: Diagnosis present

## 2015-09-08 DIAGNOSIS — R1013 Epigastric pain: Secondary | ICD-10-CM | POA: Diagnosis present

## 2015-09-08 DIAGNOSIS — N179 Acute kidney failure, unspecified: Secondary | ICD-10-CM | POA: Diagnosis present

## 2015-09-08 DIAGNOSIS — C649 Malignant neoplasm of unspecified kidney, except renal pelvis: Secondary | ICD-10-CM | POA: Diagnosis present

## 2015-09-08 DIAGNOSIS — J329 Chronic sinusitis, unspecified: Secondary | ICD-10-CM | POA: Diagnosis present

## 2015-09-08 DIAGNOSIS — Z888 Allergy status to other drugs, medicaments and biological substances status: Secondary | ICD-10-CM

## 2015-09-08 DIAGNOSIS — Z803 Family history of malignant neoplasm of breast: Secondary | ICD-10-CM | POA: Diagnosis not present

## 2015-09-08 DIAGNOSIS — I959 Hypotension, unspecified: Secondary | ICD-10-CM | POA: Diagnosis present

## 2015-09-08 DIAGNOSIS — A419 Sepsis, unspecified organism: Principal | ICD-10-CM | POA: Diagnosis present

## 2015-09-08 DIAGNOSIS — Z91030 Bee allergy status: Secondary | ICD-10-CM

## 2015-09-08 DIAGNOSIS — B349 Viral infection, unspecified: Secondary | ICD-10-CM | POA: Diagnosis present

## 2015-09-08 DIAGNOSIS — J189 Pneumonia, unspecified organism: Secondary | ICD-10-CM | POA: Diagnosis not present

## 2015-09-08 DIAGNOSIS — R6521 Severe sepsis with septic shock: Secondary | ICD-10-CM | POA: Diagnosis present

## 2015-09-08 DIAGNOSIS — Z7982 Long term (current) use of aspirin: Secondary | ICD-10-CM

## 2015-09-08 DIAGNOSIS — R918 Other nonspecific abnormal finding of lung field: Secondary | ICD-10-CM | POA: Diagnosis not present

## 2015-09-08 DIAGNOSIS — Z905 Acquired absence of kidney: Secondary | ICD-10-CM | POA: Diagnosis not present

## 2015-09-08 DIAGNOSIS — Z87891 Personal history of nicotine dependence: Secondary | ICD-10-CM | POA: Diagnosis not present

## 2015-09-08 DIAGNOSIS — R071 Chest pain on breathing: Secondary | ICD-10-CM | POA: Diagnosis not present

## 2015-09-08 DIAGNOSIS — E785 Hyperlipidemia, unspecified: Secondary | ICD-10-CM | POA: Diagnosis present

## 2015-09-08 DIAGNOSIS — E7800 Pure hypercholesterolemia, unspecified: Secondary | ICD-10-CM | POA: Diagnosis present

## 2015-09-08 DIAGNOSIS — J439 Emphysema, unspecified: Secondary | ICD-10-CM | POA: Diagnosis not present

## 2015-09-08 DIAGNOSIS — J9601 Acute respiratory failure with hypoxia: Secondary | ICD-10-CM | POA: Diagnosis present

## 2015-09-08 DIAGNOSIS — J449 Chronic obstructive pulmonary disease, unspecified: Secondary | ICD-10-CM | POA: Diagnosis present

## 2015-09-08 DIAGNOSIS — J45909 Unspecified asthma, uncomplicated: Secondary | ICD-10-CM | POA: Diagnosis present

## 2015-09-08 DIAGNOSIS — J440 Chronic obstructive pulmonary disease with acute lower respiratory infection: Secondary | ICD-10-CM | POA: Diagnosis present

## 2015-09-08 DIAGNOSIS — H409 Unspecified glaucoma: Secondary | ICD-10-CM | POA: Diagnosis present

## 2015-09-08 DIAGNOSIS — Z881 Allergy status to other antibiotic agents status: Secondary | ICD-10-CM | POA: Diagnosis not present

## 2015-09-08 DIAGNOSIS — I10 Essential (primary) hypertension: Secondary | ICD-10-CM | POA: Diagnosis present

## 2015-09-08 DIAGNOSIS — E860 Dehydration: Secondary | ICD-10-CM | POA: Diagnosis present

## 2015-09-08 DIAGNOSIS — J90 Pleural effusion, not elsewhere classified: Secondary | ICD-10-CM | POA: Diagnosis not present

## 2015-09-08 DIAGNOSIS — D649 Anemia, unspecified: Secondary | ICD-10-CM | POA: Diagnosis present

## 2015-09-08 DIAGNOSIS — Z09 Encounter for follow-up examination after completed treatment for conditions other than malignant neoplasm: Secondary | ICD-10-CM

## 2015-09-08 DIAGNOSIS — R091 Pleurisy: Secondary | ICD-10-CM | POA: Diagnosis not present

## 2015-09-08 DIAGNOSIS — Z8052 Family history of malignant neoplasm of bladder: Secondary | ICD-10-CM

## 2015-09-08 DIAGNOSIS — R05 Cough: Secondary | ICD-10-CM | POA: Diagnosis not present

## 2015-09-08 DIAGNOSIS — Z79899 Other long term (current) drug therapy: Secondary | ICD-10-CM

## 2015-09-08 DIAGNOSIS — R509 Fever, unspecified: Secondary | ICD-10-CM | POA: Diagnosis not present

## 2015-09-08 DIAGNOSIS — B370 Candidal stomatitis: Secondary | ICD-10-CM | POA: Diagnosis present

## 2015-09-08 DIAGNOSIS — J328 Other chronic sinusitis: Secondary | ICD-10-CM | POA: Diagnosis not present

## 2015-09-08 DIAGNOSIS — Z85528 Personal history of other malignant neoplasm of kidney: Secondary | ICD-10-CM

## 2015-09-08 LAB — I-STAT BETA HCG BLOOD, ED (MC, WL, AP ONLY): I-stat hCG, quantitative: <5 m[IU]/mL (ref <5)

## 2015-09-08 LAB — URINALYSIS, ROUTINE W REFLEX MICROSCOPIC
Bilirubin Urine: NEGATIVE
Glucose, UA: NEGATIVE mg/dL
Hgb urine dipstick: NEGATIVE
Ketones, ur: NEGATIVE mg/dL
Leukocytes, UA: NEGATIVE
Nitrite: NEGATIVE
Protein, ur: NEGATIVE mg/dL
Specific Gravity, Urine: 1.012 (ref 1.005–1.030)
Urobilinogen, UA: 0.2 mg/dL (ref 0.0–1.0)
pH: 5.5 (ref 5.0–8.0)

## 2015-09-08 LAB — CBC WITH DIFFERENTIAL/PLATELET
Basophils Absolute: 0 10*3/uL (ref 0.0–0.1)
Basophils Relative: 0 %
Eosinophils Absolute: 0 10*3/uL (ref 0.0–0.7)
Eosinophils Relative: 0 %
HCT: 38.1 % (ref 36.0–46.0)
Hemoglobin: 12.5 g/dL (ref 12.0–15.0)
Lymphocytes Relative: 8 %
Lymphs Abs: 2.2 10*3/uL (ref 0.7–4.0)
MCH: 30.1 pg (ref 26.0–34.0)
MCHC: 32.8 g/dL (ref 30.0–36.0)
MCV: 91.8 fL (ref 78.0–100.0)
Monocytes Absolute: 1.9 10*3/uL — ABNORMAL HIGH (ref 0.1–1.0)
Monocytes Relative: 7 %
Neutro Abs: 23.1 10*3/uL — ABNORMAL HIGH (ref 1.7–7.7)
Neutrophils Relative %: 85 %
Platelets: 282 10*3/uL (ref 150–400)
RBC: 4.15 MIL/uL (ref 3.87–5.11)
RDW: 15 % (ref 11.5–15.5)
WBC: 27.2 10*3/uL — ABNORMAL HIGH (ref 4.0–10.5)

## 2015-09-08 LAB — COMPREHENSIVE METABOLIC PANEL
ALT: 14 U/L (ref 14–54)
AST: 23 U/L (ref 15–41)
Albumin: 3.7 g/dL (ref 3.5–5.0)
Alkaline Phosphatase: 87 U/L (ref 38–126)
Anion gap: 13 (ref 5–15)
BUN: 27 mg/dL — ABNORMAL HIGH (ref 6–20)
CO2: 23 mmol/L (ref 22–32)
Calcium: 8.8 mg/dL — ABNORMAL LOW (ref 8.9–10.3)
Chloride: 99 mmol/L — ABNORMAL LOW (ref 101–111)
Creatinine, Ser: 2.01 mg/dL — ABNORMAL HIGH (ref 0.44–1.00)
GFR calc Af Amer: 28 mL/min — ABNORMAL LOW (ref >60)
GFR calc non Af Amer: 24 mL/min — ABNORMAL LOW (ref >60)
Glucose, Bld: 103 mg/dL — ABNORMAL HIGH (ref 65–99)
Potassium: 4.4 mmol/L (ref 3.5–5.1)
Sodium: 135 mmol/L (ref 135–145)
Total Bilirubin: 0.9 mg/dL (ref 0.3–1.2)
Total Protein: 7.4 g/dL (ref 6.5–8.1)

## 2015-09-08 LAB — CG4 I-STAT (LACTIC ACID): Lactic Acid, Venous: 2.99 mmol/L (ref 0.5–2.0)

## 2015-09-08 LAB — I-STAT BETA HCG BLOOD, ED (NOT ORDERABLE): I-stat hCG, quantitative: <5 m[IU]/mL (ref <5)

## 2015-09-08 LAB — INFLUENZA PANEL BY PCR (TYPE A & B)
H1N1 flu by pcr: NOT DETECTED
Influenza A By PCR: NEGATIVE
Influenza B By PCR: NEGATIVE

## 2015-09-08 LAB — I-STAT CG4 LACTIC ACID, ED: Lactic Acid, Venous: 2.66 mmol/L (ref 0.5–2.0)

## 2015-09-08 LAB — MRSA PCR SCREENING: MRSA by PCR: NEGATIVE

## 2015-09-08 MED ORDER — ASPIRIN EC 81 MG PO TBEC
81.0000 mg | DELAYED_RELEASE_TABLET | Freq: Every day | ORAL | Status: DC
  Administered 2015-09-08: 81 mg via ORAL
  Filled 2015-09-08: qty 1

## 2015-09-08 MED ORDER — FLUCONAZOLE IN SODIUM CHLORIDE 100-0.9 MG/50ML-% IV SOLN
100.0000 mg | INTRAVENOUS | Status: AC
  Administered 2015-09-08 – 2015-09-11 (×4): 100 mg via INTRAVENOUS
  Filled 2015-09-08 (×4): qty 50

## 2015-09-08 MED ORDER — DEXTROSE 5 % IV SOLN
500.0000 mg | Freq: Once | INTRAVENOUS | Status: AC
  Administered 2015-09-08: 500 mg via INTRAVENOUS
  Filled 2015-09-08: qty 500

## 2015-09-08 MED ORDER — IPRATROPIUM-ALBUTEROL 0.5-2.5 (3) MG/3ML IN SOLN
3.0000 mL | Freq: Four times a day (QID) | RESPIRATORY_TRACT | Status: DC
  Administered 2015-09-08: 3 mL via RESPIRATORY_TRACT
  Filled 2015-09-08: qty 3

## 2015-09-08 MED ORDER — TRAMADOL HCL 50 MG PO TABS
50.0000 mg | ORAL_TABLET | Freq: Two times a day (BID) | ORAL | Status: DC | PRN
  Administered 2015-09-08 – 2015-09-13 (×7): 50 mg via ORAL
  Filled 2015-09-08 (×7): qty 1

## 2015-09-08 MED ORDER — SODIUM CHLORIDE 0.9 % IV SOLN
INTRAVENOUS | Status: DC
  Administered 2015-09-08: 1000 mL via INTRAVENOUS
  Administered 2015-09-09: 100 mL/h via INTRAVENOUS
  Administered 2015-09-09: 1000 mL via INTRAVENOUS
  Administered 2015-09-10: 05:00:00 via INTRAVENOUS

## 2015-09-08 MED ORDER — SODIUM CHLORIDE 0.9 % IV BOLUS (SEPSIS)
1000.0000 mL | Freq: Once | INTRAVENOUS | Status: AC
  Administered 2015-09-08: 1000 mL via INTRAVENOUS

## 2015-09-08 MED ORDER — DEXTROSE 5 % IV SOLN
1.0000 g | Freq: Once | INTRAVENOUS | Status: AC
  Administered 2015-09-08: 1 g via INTRAVENOUS
  Filled 2015-09-08: qty 10

## 2015-09-08 MED ORDER — DEXTROSE 5 % IV SOLN
1.0000 g | INTRAVENOUS | Status: DC
  Administered 2015-09-09 – 2015-09-13 (×5): 1 g via INTRAVENOUS
  Filled 2015-09-08 (×5): qty 10

## 2015-09-08 MED ORDER — SODIUM CHLORIDE 0.9 % IV BOLUS (SEPSIS)
500.0000 mL | INTRAVENOUS | Status: AC
  Administered 2015-09-08: 500 mL via INTRAVENOUS

## 2015-09-08 MED ORDER — DEXTROSE 5 % IV SOLN
250.0000 mg | INTRAVENOUS | Status: DC
  Administered 2015-09-09 – 2015-09-10 (×2): 250 mg via INTRAVENOUS
  Filled 2015-09-08 (×2): qty 250

## 2015-09-08 MED ORDER — AZITHROMYCIN 500 MG IV SOLR: 500.0000 mg | INTRAVENOUS | Status: DC

## 2015-09-08 MED ORDER — ENOXAPARIN SODIUM 30 MG/0.3ML ~~LOC~~ SOLN
30.0000 mg | Freq: Every day | SUBCUTANEOUS | Status: DC
Start: 2015-09-08 — End: 2015-09-09
  Administered 2015-09-08: 30 mg via SUBCUTANEOUS
  Filled 2015-09-08: qty 0.3

## 2015-09-08 MED ORDER — ONDANSETRON HCL 4 MG PO TABS: 4.0000 mg | ORAL_TABLET | Freq: Four times a day (QID) | ORAL | Status: DC | PRN

## 2015-09-08 MED ORDER — GUAIFENESIN-DM 100-10 MG/5ML PO SYRP
5.0000 mL | ORAL_SOLUTION | ORAL | Status: DC | PRN
  Administered 2015-09-08 – 2015-09-09 (×2): 5 mL via ORAL
  Filled 2015-09-08 (×2): qty 10

## 2015-09-08 MED ORDER — MOMETASONE FURO-FORMOTEROL FUM 200-5 MCG/ACT IN AERO
2.0000 | INHALATION_SPRAY | Freq: Two times a day (BID) | RESPIRATORY_TRACT | Status: DC
  Administered 2015-09-08 – 2015-09-13 (×10): 2 via RESPIRATORY_TRACT
  Filled 2015-09-08: qty 8.8

## 2015-09-08 MED ORDER — ONDANSETRON HCL 4 MG/2ML IJ SOLN: 4.0000 mg | Freq: Four times a day (QID) | INTRAMUSCULAR | Status: DC | PRN

## 2015-09-08 MED ORDER — ACETAMINOPHEN 650 MG RE SUPP: 650.0000 mg | Freq: Four times a day (QID) | RECTAL | Status: DC | PRN

## 2015-09-08 MED ORDER — SODIUM CHLORIDE 0.9 % IV BOLUS (SEPSIS): 500.0000 mL | Freq: Once | INTRAVENOUS | Status: DC

## 2015-09-08 MED ORDER — ACETAMINOPHEN 325 MG PO TABS
650.0000 mg | ORAL_TABLET | Freq: Four times a day (QID) | ORAL | Status: DC | PRN
  Administered 2015-09-08 – 2015-09-12 (×4): 650 mg via ORAL
  Filled 2015-09-08 (×4): qty 2

## 2015-09-08 MED ORDER — SODIUM CHLORIDE 0.9 % IV BOLUS (SEPSIS)
1000.0000 mL | INTRAVENOUS | Status: AC
  Administered 2015-09-08 (×2): 1000 mL via INTRAVENOUS

## 2015-09-08 NOTE — ED Notes (Signed)
Pt went to the bathroom but due to bowel movement pt was unable to get clean catch.

## 2015-09-08 NOTE — ED Notes (Signed)
Hospitalist at bedside 

## 2015-09-08 NOTE — ED Notes (Signed)
Pt attempted to provide urine specimen but was unsuccessful.

## 2015-09-08 NOTE — ED Notes (Addendum)
Patient comes from home with c/o cough, nasal drainage and weakness that began earlier this week (Monday or Tuesday) and has grown progressively worse.  Patient denies N/V/D and fever.  Patient's lung sounds diminished right lobes, diminished/clear other lobes.  Heart sounds WNL, S1/S2, no rub, murmur or gallop appreciated.  No pre-tibial or pedal edema.  +2 radial pulses.

## 2015-09-08 NOTE — ED Provider Notes (Signed)
CSN: [id number]     Arrival date & time 09/08/15  4174 History   First MD Initiated Contact with Patient 09/08/15 [phone]     Chief Complaint  Patient presents with  . Code Sepsis  . Shortness of Breath  . Flank Pain     (Consider location/radiation/quality/duration/timing/severity/associated sxs/prior Treatment) HPI Comments: The patient is a 74 year old female with a history of hypertension and hypercholesterolemia as well as a history of COPD. She presents with several days of worsening shortness of breath, fever and body aches, specifically states that she has had a mild productive cough with pain at her right posterior lateral chest. She was 86% on room air upon her arrival. She has had a flu shot and a pneumonia shot this year already. The symptoms are persistent, gradually worsening, she has been using Tylenol for fevers.  She denies any history of cardiac disease. She has been using her inhaler twice a day as prescribed, no increased need for use according to the patient.  Patient is a 74 y.o. female presenting with shortness of breath. The history is provided by the patient.  Shortness of Breath   Past Medical History  Diagnosis Date  . COPD (chronic obstructive pulmonary disease) ([place])   . History of kidney cancer   . Hypertension   . Cancer ([place])   . Depression   . Asthma   . Glaucoma   . Hyperlipidemia   . Heart murmur    Past Surgical History  Procedure Laterality Date  . Nephrectomy    . Kidney cancer    . Tubal ligation    . Spine surgery    . Eye surgery     Family History  Problem Relation Age of Onset  . Heart disease Sister   . Obesity Brother   . Heart disease Daughter   . Glaucoma Daughter   . Breast cancer Sister   . Birth defects Sister   . Cancer Mother     Bladder Cancer  . Hypertension Mother   . Heart disease Mother   . Cancer Maternal Grandmother    Social History  Substance Use Topics  . Smoking status: Former Smoker    Types:  Cigarettes    Quit date: 03/16/2011  . Smokeless tobacco: Never Used  . Alcohol Use: No   OB History    No data available     Review of Systems  Respiratory: Positive for shortness of breath.   All other systems reviewed and are negative.     Allergies  Levofloxacin; Amlodipine; and Hctz  Home Medications   Prior to Admission medications   Medication Sig Start Date End Date Taking? Authorizing Provider  albuterol (PROVENTIL HFA;VENTOLIN HFA) 108 (90 BASE) MCG/ACT inhaler Inhale 2 puffs into the lungs every 4 (four) hours as needed for wheezing or shortness of breath. 05/18/15 09/03/16  [name], MD  amoxicillin-clavulanate (AUGMENTIN) 875-125 MG per tablet Take 1 tablet by mouth 2 (two) times daily. 07/30/15   Roselee Culver, MD  aspirin 81 MG tablet Take 81 mg by mouth daily.     Historical Provider, MD  atorvastatin (LIPITOR) 40 MG tablet TAKE ONE TABLET BY MOUTH ONCE DAILY. 08/28/15   Chelle Jeffery, PA-C  benazepril (LOTENSIN) 40 MG tablet Take 1 tablet (40 mg total) by mouth daily. 08/29/15   [name], MD  chlorpheniramine-HYDROcodone [facility]) 10-8 MG/5ML SUER Take 5 mLs by mouth 2 (two) times daily. 07/30/15   Roselee Culver, MD  Cholecalciferol (VITAMIN  D-3 PO) Take 1 tablet by mouth daily.    Historical Provider, MD  fish oil-omega-3 fatty acids 1000 MG capsule Take 1 g by mouth daily.      Historical Provider, MD  KRILL OIL PO Take 1 capsule by mouth every morning.    Historical Provider, MD  mometasone-formoterol (DULERA) 200-5 MCG/ACT AERO Inhale 2 puffs into the lungs 2 (two) times daily. 08/29/15   [name], MD  pseudoephedrine-guaifenesin [facility] D) 60-600 MG per tablet Take 1 tablet by mouth daily. 07/30/15 07/29/16  Roselee Culver, MD   BP 85/37 mmHg  Pulse 92  Temp(Src) 98.4 F (36.9 C) (Oral)  Resp 16  Ht 5' 4.5" (1.638 m)  Wt 178 lb (80.74 kg)  BMI 30.09 kg/m2  SpO2 98% Physical Exam  Constitutional: She  appears well-developed and well-nourished.  HENT:  Head: Normocephalic and atraumatic.  Mouth/Throat: Oropharynx is clear and moist. No oropharyngeal exudate.  Eyes: Conjunctivae and EOM are normal. Pupils are equal, round, and reactive to light. Right eye exhibits no discharge. Left eye exhibits no discharge. No scleral icterus.  Neck: Normal range of motion. Neck supple. No JVD present. No thyromegaly present.  Cardiovascular: Regular rhythm, normal heart sounds and intact distal pulses.  Exam reveals no gallop and no friction rub.   No murmur heard. Tachycardic to 110  Pulmonary/Chest: Effort normal. No respiratory distress. She has no wheezes. She has rales.  No increased work of breathing, she is hypoxic on room air, rales at the right base, no accessory muscle use or wheezing  Abdominal: Soft. Bowel sounds are normal. She exhibits no distension and no mass. There is no tenderness.  Musculoskeletal: Normal range of motion. She exhibits no edema or tenderness.  Lymphadenopathy:    She has no cervical adenopathy.  Neurological: She is alert. Coordination normal.  Skin: Skin is warm. No rash noted. She is diaphoretic. No erythema.  Psychiatric: She has a normal mood and affect. Her behavior is normal.  Nursing note and vitals reviewed.   ED Course  Procedures (including critical care time) Labs Review Labs Reviewed  COMPREHENSIVE METABOLIC PANEL - Abnormal; Notable for the following:    Chloride 99 (*)    Glucose, Bld 103 (*)    BUN 27 (*)    Creatinine, Ser 2.01 (*)    Calcium 8.8 (*)    GFR calc non Af Amer 24 (*)    GFR calc Af Amer 28 (*)    All other components within normal limits  CBC WITH DIFFERENTIAL/PLATELET - Abnormal; Notable for the following:    WBC 27.2 (*)    Neutro Abs 23.1 (*)    Monocytes Absolute 1.9 (*)    All other components within normal limits  I-STAT CG4 LACTIC ACID, ED - Abnormal; Notable for the following:    Lactic Acid, Venous 2.66 (*)    All  other components within normal limits  CULTURE, BLOOD (ROUTINE X 2)  CULTURE, BLOOD (ROUTINE X 2)  URINE CULTURE  URINALYSIS, ROUTINE W REFLEX MICROSCOPIC (NOT AT [facility])  INFLUENZA PANEL BY PCR (TYPE A & B, H1N1)  I-STAT CG4 LACTIC ACID, ED    Imaging Review Dg Chest 2 View  09/08/2015  CLINICAL DATA:  Cough, congestion EXAM: CHEST  2 VIEW COMPARISON:  10/27/2014 FINDINGS: Cardiomediastinal silhouette is stable. Hyperinflation is noted. Bilateral perihilar bronchitic changes. There is right base posterior medially infiltrate suspicious for pneumonia best seen on lateral view. Follow-up to resolution is recommended. IMPRESSION: Hyperinflation is noted.  Bilateral perihilar bronchitic changes. There is right base posterior medially infiltrate suspicious for pneumonia best seen on lateral view. Follow-up to resolution is recommended. Electronically Signed   By: Lahoma Crocker M.D.   On: 09/08/2015 09:53   I have personally reviewed and evaluated these images and lab results as part of my medical decision-making.   EKG Interpretation   Date/Time:  Thursday September 08 2015 08:49:15 EDT Ventricular Rate:  107 PR Interval:  130 QRS Duration: 70 QT Interval:  321 QTC Calculation: 428 R Axis:   -113 Text Interpretation:  Sinus tachycardia Right superior axis Probable right  ventricular hypertrophy since last tracing no significant change Confirmed  by Kess Mcilwain  MD, Corena Tilson ([id number]) on 09/08/2015 9:16:26 AM      MDM   Final diagnoses:  Sepsis, due to unspecified organism ([place])  CAP (community acquired pneumonia)  Acute renal failure, unspecified acute renal failure type ([place])    The patient has signs and symptoms consistent with pneumonia, x-ray and labs ordered, lactic acid is 2.6, IV fluid bolus has been ordered for sepsis protocol, antibiotics ordered, flu testing ordered due to myalgias and fever, she does not have a fever here despite subjective reported fever at home. The patient does have  tachycardia and hypoxia consistent with a sepsis syndrome.  The pt has severe sepsis - ongoing hypotension despite half fluid rescucitation - continuing fluids at this time - VS watched closely - she has ongoing O2 requirement - she has worsening hypotension and there is signs of multi organ system dysfunction with ARF.  She has PNA on the R - I have personally seen this on the CXR on my interpretation.  D/w Dr. Karleen Hampshire of hospitalist and Dr. Chase Caller of the [place] team - the latter will come to see the pt at this time to evaluate for possible ICU admission - on reexamination at 10:50 - the pt has no increased respiratory distress.  CC will consult - hospitalist to admit per CC.  I have personally viewed and interpreted the imaging and agree with radiologist interpretation.  Meds given in ED:  Medications  sodium chloride 0.9 % bolus 1,000 mL (1,000 mLs Intravenous New Bag/Given 09/08/15 1029)    Followed by  sodium chloride 0.9 % bolus 500 mL (0 mLs Intravenous Stopped 09/08/15 1022)  azithromycin (ZITHROMAX) 500 mg in dextrose 5 % 250 mL IVPB (not administered)  cefTRIAXone (ROCEPHIN) 1 g in dextrose 5 % 50 mL IVPB (not administered)  cefTRIAXone (ROCEPHIN) 1 g in dextrose 5 % 50 mL IVPB (0 g Intravenous Stopped 09/08/15 1014)  azithromycin (ZITHROMAX) 500 mg in dextrose 5 % 250 mL IVPB (500 mg Intravenous New Bag/Given 09/08/15 0946)    CRITICAL CARE Performed by: Noemi Chapel D Total critical care time: 35 Critical care time was exclusive of separately billable procedures and treating other patients. Critical care was necessary to treat or prevent imminent or life-threatening deterioration. Critical care was time spent personally by me on the following activities: development of treatment plan with patient and/or surrogate as well as nursing, discussions with consultants, evaluation of patient's response to treatment, examination of patient, obtaining history from patient or surrogate,  ordering and performing treatments and interventions, ordering and review of laboratory studies, ordering and review of radiographic studies, pulse oximetry and re-evaluation of patient's condition.     Noemi Chapel, MD 09/08/15 [phone]

## 2015-09-08 NOTE — Progress Notes (Signed)
ANTIBIOTIC CONSULT NOTE - INITIAL  Pharmacy Consult for azithromycin/ceftriaxone Indication: CAP  Allergies  Allergen Reactions  . Levofloxacin     myalgia  . Amlodipine Swelling  . Hctz [Hydrochlorothiazide]     Patient Measurements: Height: 5' 4.5" (163.8 cm) Weight: 178 lb (80.74 kg) IBW/kg (Calculated) : 55.85 Adjusted Body Weight:   Vital Signs: Temp: 99 F (37.2 C) (10/27 0839) Temp Source: Oral (10/27 0839) BP: 106/56 mmHg (10/27 0839) Pulse Rate: 108 (10/27 0842) Intake/Output from previous day:   Intake/Output from this shift:    Labs:  Recent Labs  09/08/15 0908  WBC 27.2*  HGB 12.5  PLT 282  CREATININE 2.01*   Estimated Creatinine Clearance: 26.7 mL/min (by C-G formula based on Cr of 2.01). No results for input(s): VANCOTROUGH, VANCOPEAK, VANCORANDOM, GENTTROUGH, GENTPEAK, GENTRANDOM, TOBRATROUGH, TOBRAPEAK, TOBRARND, AMIKACINPEAK, AMIKACINTROU, AMIKACIN in the last 72 hours.   Microbiology: No results found for this or any previous visit (from the past 720 hour(s)).  Medical History: Past Medical History  Diagnosis Date  . COPD (chronic obstructive pulmonary disease) ([place])   . History of kidney cancer   . Hypertension   . Cancer ([place])   . Depression   . Asthma   . Glaucoma   . Hyperlipidemia   . Heart murmur     Medications:   (Not in a hospital admission) Assessment: Pt is a 74 yo female diagnosed with CAP. PMH is significant for COPD and pt only has right kidney. She presents with several days of worsening shortness of breath, fever and body aches,with a mild productive cough with pain at her right posterior lateral chest.    10/27 >> ceftriaxone >> 10/27 >> azithromycin >>    / blood x 2: IP / urine:    Goal of Therapy:  Eradication of infection  Plan:  Follow up culture results  Continue azithromycin 500 mg IV q24h. Continue ceftriaxone 1 gr IV q24h.  Royetta Asal, PharmD, BCPS Pager [phone] 09/08/15 09:56

## 2015-09-08 NOTE — ED Notes (Signed)
Pt reminded of need for urine specimen. Instructed to hit call bell when able to void.

## 2015-09-08 NOTE — ED Notes (Signed)
Patient asked to be repositioned to sit on side of bed, A&O x4, respirations even and unlabored, skin warm and dry, denies further needs at this time, in NAD, will continue to monitor.

## 2015-09-08 NOTE — ED Notes (Signed)
Pt reports body aches and fever, since Tuesday 10/25 and just "not feeling well". Since yesterday 10/26 post nasal drainage, with blood in spit, productive cough white sputum, weakness, dizziness, SOB, and right flank pain. Pt only has right kidney, left kidney removed due to cancer.  Pain 5/10.  86% on room air upon arrival. Does not wear oxygen.   Denies dysuria. Denies abd pain, n/v/d.

## 2015-09-08 NOTE — Consult Note (Signed)
PULMONARY / CRITICAL CARE MEDICINE   Name: RETINA BERNARDY MRN: [id number] DOB: 17-Dec-1940    ADMISSION DATE:  09/08/2015 CONSULTATION DATE: 10/27  REFERRING MD :  edp  CHIEF COMPLAINT:  Weakness  INITIAL PRESENTATION: Fever, chills, malaise   STUDIES:    SIGNIFICANT EVENTS:    HISTORY OF PRESENT ILLNESS:   74 yo former smoker,1/2 ppd age [age], frequent sinus infections, last 3 weeks ago treated with Augmentin, who [facility] to [facility] ED 10/27 with 48 hours of fever(102 t max) chills, post nasal drip that is bloody. + for chills without frank sputum production. Increase wheezing for 36 hours and mild increase in SOB. 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 No recent travel or sick exposures. PCCM asked to evaluate.  Lactic acid is 2.66 and 2 v c xr with ? Left lower pna. She was hypotensive but takes antihypertensives for HTN (lotension). Hx of rt renal nephrectomy for renal ca 2 years ago.  She takes dulera for COPD along with PRN albuterol. She is normotensive currently, awake and sitting up in NAD.  PAST MEDICAL HISTORY :   has a past medical history of COPD (chronic obstructive pulmonary disease) ([place]); History of kidney cancer; Hypertension; Cancer ([place]); Depression; Asthma; Glaucoma; Hyperlipidemia; and Heart murmur.  has past surgical history that includes Nephrectomy; kidney cancer; Tubal ligation; Spine surgery; and Eye surgery. Prior to Admission medications   Medication Sig Start Date End Date Taking? Authorizing Provider  albuterol (PROVENTIL HFA;VENTOLIN HFA) 108 (90 BASE) MCG/ACT inhaler Inhale 2 puffs into the lungs every 4 (four) hours as needed for wheezing or shortness of breath. 05/18/15 09/03/16  [name], MD  amoxicillin-clavulanate (AUGMENTIN) 875-125 MG per tablet Take 1 tablet by mouth 2 (two) times daily. 07/30/15   Roselee Culver, MD  aspirin 81 MG tablet Take 81 mg by mouth daily.     Historical Provider, MD  atorvastatin  (LIPITOR) 40 MG tablet TAKE ONE TABLET BY MOUTH ONCE DAILY. 08/28/15   Chelle Jeffery, PA-C  benazepril (LOTENSIN) 40 MG tablet Take 1 tablet (40 mg total) by mouth daily. 08/29/15   [name], MD  chlorpheniramine-HYDROcodone [facility] ER) 10-8 MG/5ML SUER Take 5 mLs by mouth 2 (two) times daily. 07/30/15   Roselee Culver, MD  Cholecalciferol (VITAMIN D-3 PO) Take 1 tablet by mouth daily.    Historical Provider, MD  fish oil-omega-3 fatty acids 1000 MG capsule Take 1 g by mouth daily.      Historical Provider, MD  KRILL OIL PO Take 1 capsule by mouth every morning.    Historical Provider, MD  mometasone-formoterol (DULERA) 200-5 MCG/ACT AERO Inhale 2 puffs into the lungs 2 (two) times daily. 08/29/15   [name], MD  pseudoephedrine-guaifenesin [facility] D) 60-600 MG per tablet Take 1 tablet by mouth daily. 07/30/15 07/29/16  Roselee Culver, MD   Allergies  Allergen Reactions  . Levofloxacin     myalgia  . Amlodipine Swelling  . Hctz [Hydrochlorothiazide] Palpitations and Other (See Comments)    Sweating     FAMILY HISTORY:  indicated that her mother is deceased. She indicated that her father is deceased. She indicated that only one of her two sisters is alive. She indicated that all of her three brothers are deceased. She indicated that her maternal grandmother is deceased. She indicated that her maternal grandfather is deceased. She indicated that her paternal grandmother is deceased. She indicated that her paternal grandfather is deceased. She indicated that  both of her daughters are alive. She indicated that both of her sons are alive.  SOCIAL HISTORY:  reports that she quit smoking about 4 years ago. Her smoking use included Cigarettes. She has never used smokeless tobacco. She reports that she does not drink alcohol or use illicit drugs.  REVIEW OF SYSTEMS:   10 point review of system taken, please see HPI for positives and negatives.   SUBJECTIVE:    VITAL SIGNS: Temp:  [98.4 F (36.9 C)-99 F (37.2 C)] 98.4 F (36.9 C) (10/27 0954) Pulse Rate:  [92-108] 92 (10/27 1000) Resp:  [16-20] 16 (10/27 1000) BP: (85-106)/(37-56) 85/37 mmHg (10/27 1000) SpO2:  [86 %-98 %] 98 % (10/27 1000) Weight:  [178 lb (80.74 kg)] 178 lb (80.74 kg) (10/27 0913) HEMODYNAMICS:   VENTILATOR SETTINGS:   INTAKE / OUTPUT: No intake or output data in the 24 hours ending 09/08/15 1112  PHYSICAL EXAMINATION: General:  WNWDWF NAD at rest. Neuro:  Intact, follows commands, speech clear HEENT:  PERL 4 mm, Oral pharynx no overt sinus drainage. Oral lingual white coating cw thrush noted. No JVD/LAN Cardiovascular:  HSR RRR Lungs:  +exp wheeze with VCD component  Abdomen:  Obese soft + bs Musculoskeletal:  intact Skin:  Warm and dry without LE edema  LABS:  CBC  Recent Labs Lab 09/08/15 0908  WBC 27.2*  HGB 12.5  HCT 38.1  PLT 282   Coag's No results for input(s): APTT, INR in the last 168 hours. BMET  Recent Labs Lab 09/08/15 0908  NA 135  K 4.4  CL 99*  CO2 23  BUN 27*  CREATININE 2.01*  GLUCOSE 103*   Electrolytes  Recent Labs Lab 09/08/15 0908  CALCIUM 8.8*   Sepsis Markers  Recent Labs Lab 09/08/15 0913  LATICACIDVEN 2.66*   ABG No results for input(s): PHART, PCO2ART, PO2ART in the last 168 hours. Liver Enzymes  Recent Labs Lab 09/08/15 0908  AST 23  ALT 14  ALKPHOS 87  BILITOT 0.9  ALBUMIN 3.7   Cardiac Enzymes No results for input(s): TROPONINI, PROBNP in the last 168 hours. Glucose No results for input(s): GLUCAP in the last 168 hours.  Imaging Dg Chest 2 View  09/08/2015  CLINICAL DATA:  Cough, congestion EXAM: CHEST  2 VIEW COMPARISON:  10/27/2014 FINDINGS: Cardiomediastinal silhouette is stable. Hyperinflation is noted. Bilateral perihilar bronchitic changes. There is right base posterior medially infiltrate suspicious for pneumonia best seen on lateral view. Follow-up to resolution is  recommended. IMPRESSION: Hyperinflation is noted. Bilateral perihilar bronchitic changes. There is right base posterior medially infiltrate suspicious for pneumonia best seen on lateral view. Follow-up to resolution is recommended. Electronically Signed   By: Lahoma Crocker M.D.   On: 09/08/2015 09:53     ASSESSMENT / PLAN:  PULMONARY OETT A: Presumed LLL pna COPD Chronic sinusitis Ro viral process with acute onset P:   O2 as needed BD as needed + wheeze but hold on steroids for now , more vcd in nature Hold dulera with current thrush Note hx of renal cancer, consider ct of chest for completeness Check for virus 2 recent cases of legionella therefore check legionella  CARDIOVASCULAR CVL A:  Hypotension Hx of HTN P:  DC antihypertensives IVF as needed Doubt she will need pressors  RENAL Lab Results  Component Value Date   CREATININE 2.01* 09/08/2015   CREATININE 1.08 12/02/2013   CREATININE 1.11* 09/04/2013   CREATININE 1.26* 07/03/2013   CREATININE 1.20* 11/15/2011   CREATININE 0.82  04/22/2011    A:   Hx of right nephrectomy with creatine doubled in one year Elevated lactic acid P:   Stop ACE-I IVF resuscitation Follow creatine and lactic acid  GASTROINTESTINAL A:   GI protection P:   PPI while in SDU  HEMATOLOGIC A:   No acute issue P:    INFECTIOUS A:  Presume CAP with possible sinus infection as source Oral thrush(recent abx tx) P:   BCx2 10/27>> UC 10/27 Urine strep 10/27>> Urine legionella 10/27>> Resp virus 10/27>> Sputum Abx:  10/27 roc>> 10/27 zithro>> 10/27  ENDOCRINE A:   No acute issue  P:     NEUROLOGIC A:   No acute issue P:   RASS goal: 1    FAMILY  - Updates: Pt and daughter updated  - Inter-disciplinary family meet or Palliative Care meeting due by:  day 7    TODAY'S SUMMARY:  74 yo former smoker,1/2 ppd age [age], frequent sinus infections, last 3 weeks ago treated with Augmentin, who presents to [facility] ED  10/27 with 48 hours of fever(102 t max) chills, post nasal drip that is bloody. + for chills without frank sputum production. Increase wheezing for 36 hours and mild increase in SOB. 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 No recent travel or sick exposures. PCCM asked to evaluate.  Lactic acid is 2.66 and 2 v c xr with ? Left lower pna. She was hypotensive but takes antihypertensives for HTN (lotension). Hx of rt renal nephrectomy for renal ca 2 years ago.  She takes dulera for COPD along with PRN albuterol. She is normotensive currently, awake and sitting up in NAD. Admit to SDU for safety.   Richardson Landry Minor ACNP Maryanna Shape PCCM Pager [phone] till 3 pm If no answer page [phone] 09/08/2015, 11:20 AM

## 2015-09-08 NOTE — H&P (Signed)
[facility] History and Physical  LAIYLAH ROETTGER TKW:[id number] DOB: 11-Sep-1941 DOA: 09/08/2015  Referring physician: EDP PCP: [name], MD   Chief Complaint: fever and sob.   HPI: DEANETTE TULLIUS is a 74 y.o. female with h/o copd, frequent sinus infections, hypertension,  presented to ED with 2 days of fevers and chills, sob and cough. On arrival to ED, she was febrile, tachycardic, hypotensive, hypoxic and witht elevated lactic acid.  She was found to have pneumonia on CXR.  She was referred to medical service for sepsis from CAP. PCCM consulted for recommendations.    Review of Systems:  Constitutional:  No weight loss, night sweats, Fevers, chills, fatigue.  HEENT:  No headaches, Difficulty swallowing,Tooth/dental problems,Sore throat,  No sneezing, itching, ear ache, nasal congestion, post nasal drip,  Cardio-vascular:  No chest pain, Orthopnea, PND, swelling in lower extremities, anasarca, dizziness, palpitations  GI:  Diarrhea, and nausea.  Resp:  Cough and sob.  Skin:  no rash or lesions.  GU:  no dysuria, change in color of urine, no urgency or frequency. No flank pain.  Musculoskeletal:  No joint pain or swelling. No decreased range of motion. No back pain.  Psych:  No change in mood or affect. No depression or anxiety. No memory loss.   Past Medical History  Diagnosis Date  . COPD (chronic obstructive pulmonary disease) ([place])   . History of kidney cancer   . Hypertension   . Cancer ([place])   . Depression   . Asthma   . Glaucoma   . Hyperlipidemia   . Heart murmur    Past Surgical History  Procedure Laterality Date  . Nephrectomy    . Kidney cancer    . Tubal ligation    . Spine surgery    . Eye surgery     Social History:  reports that she quit smoking about 4 years ago. Her smoking use included Cigarettes. She has never used smokeless tobacco. She reports that she does not drink alcohol or use illicit drugs.  Allergies  Allergen  Reactions  . Bee Venom Anaphylaxis, Shortness Of Breath and Swelling    Swelling at site   . Levofloxacin Other (See Comments)    Joint pain   . Amlodipine Swelling    Swelling of the ankles and hands   . Hctz [Hydrochlorothiazide] Palpitations and Other (See Comments)    Sweating     Family History  Problem Relation Age of Onset  . Heart disease Sister   . Obesity Brother   . Heart disease Daughter   . Glaucoma Daughter   . Breast cancer Sister   . Birth defects Sister   . Cancer Mother     Bladder Cancer  . Hypertension Mother   . Heart disease Mother   . Cancer Maternal Grandmother     Prior to Admission medications   Medication Sig Start Date End Date Taking? Authorizing Provider  acetaminophen (TYLENOL) 500 MG tablet Take 1,000 mg by mouth every 6 (six) hours as needed for mild pain, moderate pain, fever or headache.   Yes Historical Provider, MD  albuterol (PROVENTIL HFA;VENTOLIN HFA) 108 (90 BASE) MCG/ACT inhaler Inhale 2 puffs into the lungs every 4 (four) hours as needed for wheezing or shortness of breath. 05/18/15 09/03/16 Yes [name], MD  aspirin EC 81 MG tablet Take 81 mg by mouth daily.   Yes Historical Provider, MD  atorvastatin (LIPITOR) 40 MG tablet TAKE ONE TABLET BY MOUTH ONCE DAILY. 08/28/15  Yes  Chelle Jeffery, PA-C  benazepril (LOTENSIN) 40 MG tablet Take 1 tablet (40 mg total) by mouth daily. 08/29/15  Yes [name], MD  CALCIUM PO Take 1 tablet by mouth daily.   Yes Historical Provider, MD  chlorpheniramine-HYDROcodone (TUSSIONEX PENNKINETIC ER) 10-8 MG/5ML SUER Take 5 mLs by mouth 2 (two) times daily. Patient taking differently: Take 5 mLs by mouth every 12 (twelve) hours as needed for cough.  07/30/15  Yes Roselee Culver, MD  Cholecalciferol (VITAMIN D-3 PO) Take 1 tablet by mouth daily.   Yes Historical Provider, MD  fish oil-omega-3 fatty acids 1000 MG capsule Take 1 g by mouth daily.     Yes Historical Provider, MD   mometasone-formoterol (DULERA) 200-5 MCG/ACT AERO Inhale 2 puffs into the lungs 2 (two) times daily. 08/29/15  Yes [name], MD  polyvinyl alcohol-povidone (REFRESH) 1.4-0.6 % ophthalmic solution Place 2 drops into both eyes daily as needed (for dry eyes).   Yes Historical Provider, MD   Physical Exam: Filed Vitals:   09/08/15 1130 09/08/15 1200 09/08/15 1400 09/08/15 1600  BP: 99/49 112/57 109/52   Pulse: 94 64 102   Temp:    100.9 F (38.3 C)  TempSrc:    Oral  Resp: '22 21 14   '$ Height:      Weight:      SpO2: 94% 81% 94%     Wt Readings from Last 3 Encounters:  09/08/15 80.74 kg (178 lb)  07/30/15 83.065 kg (183 lb 2 oz)  03/21/15 86.183 kg (190 lb)    General:  Appears calm and comfortable Eyes: PERRL, normal lids, irises & conjunctiva Neck: no LAD, masses or thyromegaly Cardiovascular: RRR, no m/r/g. No LE edema. Telemetry: SR, no arrhythmias  Respiratory: CTA bilaterally, no w/r/r. Normal respiratory effort. Abdomen: soft, ntnd Skin: no rash or induration seen on limited exam Musculoskeletal: grossly normal tone BUE/BLE Psychiatric: grossly normal mood and affect, speech fluent and appropriate Neurologic: grossly non-focal.          Labs on Admission:  Basic Metabolic Panel:  Recent Labs Lab 09/08/15 0908  NA 135  K 4.4  CL 99*  CO2 23  GLUCOSE 103*  BUN 27*  CREATININE 2.01*  CALCIUM 8.8*   Liver Function Tests:  Recent Labs Lab 09/08/15 0908  AST 23  ALT 14  ALKPHOS 87  BILITOT 0.9  PROT 7.4  ALBUMIN 3.7   No results for input(s): LIPASE, AMYLASE in the last 168 hours. No results for input(s): AMMONIA in the last 168 hours. CBC:  Recent Labs Lab 09/08/15 0908  WBC 27.2*  NEUTROABS 23.1*  HGB 12.5  HCT 38.1  MCV 91.8  PLT 282   Cardiac Enzymes: No results for input(s): CKTOTAL, CKMB, CKMBINDEX, TROPONINI in the last 168 hours.  BNP (last 3 results) No results for input(s): BNP in the last 8760 hours.  ProBNP (last 3  results) No results for input(s): PROBNP in the last 8760 hours.  CBG: No results for input(s): GLUCAP in the last 168 hours.  Radiological Exams on Admission: Dg Chest 2 View  09/08/2015  CLINICAL DATA:  Cough, congestion EXAM: CHEST  2 VIEW COMPARISON:  10/27/2014 FINDINGS: Cardiomediastinal silhouette is stable. Hyperinflation is noted. Bilateral perihilar bronchitic changes. There is right base posterior medially infiltrate suspicious for pneumonia best seen on lateral view. Follow-up to resolution is recommended. IMPRESSION: Hyperinflation is noted. Bilateral perihilar bronchitic changes. There is right base posterior medially infiltrate suspicious for pneumonia best seen on lateral view. Follow-up  to resolution is recommended. Electronically Signed   By: Lahoma Crocker M.D.   On: 09/08/2015 09:53    EKG: Independently reviewed. Sinus tachycardia. .  Assessment/Plan Active Problems:   Sepsis ([place])   Sepsis from acute respiratory failure from CAP: Admit to step down,  Started on IV antibiotics, [place] oxygen to keep sats greater than 90%. Bronchodilators as needed.  Resume dulera.  Blood cultures and sputum cultures ordered.  Influenza pcr is negative.  resp panel pending.    Mild epigastric pain: Unclear etiology. Monitor.    Acute renal failure: Probably from dehydration and infection.  Urine electrolytes UA,  REPEAT renal parameters in am, if not improving, get US RENAL.    Copd: No wheezing heard.   Renal cancer s/p nephrectomy.  Viral syndrome: With myalgias, fevers.  Monitor and symptomatic management.   Code Status: full code.  DVT Prophylaxis: Family Communication: family at bedside.  Disposition Plan: admit to step down.   Time spent:65 min  [facility] Pager [phone]

## 2015-09-09 LAB — COMPREHENSIVE METABOLIC PANEL
ALT: 11 U/L — ABNORMAL LOW (ref 14–54)
AST: 13 U/L — ABNORMAL LOW (ref 15–41)
Albumin: 2.6 g/dL — ABNORMAL LOW (ref 3.5–5.0)
Alkaline Phosphatase: 71 U/L (ref 38–126)
Anion gap: 5 (ref 5–15)
BUN: 22 mg/dL — ABNORMAL HIGH (ref 6–20)
CO2: 24 mmol/L (ref 22–32)
Calcium: 7.6 mg/dL — ABNORMAL LOW (ref 8.9–10.3)
Chloride: 111 mmol/L (ref 101–111)
Creatinine, Ser: 1.28 mg/dL — ABNORMAL HIGH (ref 0.44–1.00)
GFR calc Af Amer: 48 mL/min — ABNORMAL LOW (ref >60)
GFR calc non Af Amer: 41 mL/min — ABNORMAL LOW (ref >60)
Glucose, Bld: 89 mg/dL (ref 65–99)
Potassium: 4.2 mmol/L (ref 3.5–5.1)
Sodium: 140 mmol/L (ref 135–145)
Total Bilirubin: 0.4 mg/dL (ref 0.3–1.2)
Total Protein: 5.8 g/dL — ABNORMAL LOW (ref 6.5–8.1)

## 2015-09-09 LAB — CBC
HCT: 30.7 % — ABNORMAL LOW (ref 36.0–46.0)
Hemoglobin: 9.8 g/dL — ABNORMAL LOW (ref 12.0–15.0)
MCH: 29.6 pg (ref 26.0–34.0)
MCHC: 31.9 g/dL (ref 30.0–36.0)
MCV: 92.7 fL (ref 78.0–100.0)
Platelets: 246 10*3/uL (ref 150–400)
RBC: 3.31 MIL/uL — ABNORMAL LOW (ref 3.87–5.11)
RDW: 15.2 % (ref 11.5–15.5)
WBC: 19.2 10*3/uL — ABNORMAL HIGH (ref 4.0–10.5)

## 2015-09-09 LAB — STREP PNEUMONIAE URINARY ANTIGEN: Strep Pneumo Urinary Antigen: NEGATIVE

## 2015-09-09 LAB — LEGIONELLA PNEUMOPHILA SEROGP 1 UR AG: L. pneumophila Serogp 1 Ur Ag: NEGATIVE

## 2015-09-09 LAB — LACTIC ACID, PLASMA: Lactic Acid, Venous: 1.3 mmol/L (ref 0.5–2.0)

## 2015-09-09 MED ORDER — ENOXAPARIN SODIUM 40 MG/0.4ML ~~LOC~~ SOLN
40.0000 mg | Freq: Every day | SUBCUTANEOUS | Status: DC
  Administered 2015-09-09 – 2015-09-12 (×4): 40 mg via SUBCUTANEOUS
  Filled 2015-09-09 (×5): qty 0.4

## 2015-09-09 MED ORDER — IPRATROPIUM-ALBUTEROL 0.5-2.5 (3) MG/3ML IN SOLN
3.0000 mL | Freq: Three times a day (TID) | RESPIRATORY_TRACT | Status: DC
  Administered 2015-09-09 – 2015-09-13 (×14): 3 mL via RESPIRATORY_TRACT
  Filled 2015-09-09 (×15): qty 3

## 2015-09-09 NOTE — Progress Notes (Signed)
[facility] PROGRESS NOTE  Tammy Ball TKP:[id number] DOB: 1941/06/22 DOA: 09/08/2015 PCP: [name], MD  Assessment/Plan: Sepsis from acute respiratory failure from CAP: fever, hypotension, tachycardia,  Tachypnea and elevated lactic acid on admission, with CXR showing pneumonia.  Admitted to step down initially,  Started on IV antibiotics, [place] oxygen to keep sats greater than 90%. Bronchodilators as needed.  Resume dulera.  Blood cultures and sputum cultures ordered, negative so far.  Influenza pcr is negative.  resp panel pending.  Resume antibiotics and taper the oxygen.  Get PT eval.  Repeat lactic acid is normal.    Mild epigastric pain: Unclear etiology. Resolved.     Acute renal failure: Probably from dehydration and infection.  Urine electrolytes UA,  REPEAT renal parameters  Improving.    Copd: No wheezing heard.   Renal cancer s/p nephrectomy.  Viral syndrome: With myalgias, fevers.  Monitor and symptomatic management.     Code Status: full code.  Family Communication: none at bedside Disposition Plan: TRANSFER TO [place]   Consultants:  PCCM.   Procedures:  none  Antibiotics:  Rocephin   Zithromax.   HPI/Subjective: Legs are swollen   Objective: Filed Vitals:   09/09/15 1640  BP: 134/65  Pulse: 87  Temp: 98.5 F (36.9 C)  Resp: 18    Intake/Output Summary (Last 24 hours) at 09/09/15 1852 Last data filed at 09/09/15 1600  Gross per 24 hour  Intake   3775 ml  Output   2750 ml  Net   1025 ml   Filed Weights   09/08/15 0913  Weight: 80.74 kg (178 lb)    Exam:   General:  Alert afebrile comfortable  Cardiovascular: s1s2  Respiratory: ctab  Abdomen: soft non tender  Non distended bowel sounds.   Musculoskeletal: no pedal edema  Data Reviewed: Basic Metabolic Panel:  Recent Labs Lab 09/08/15 0908 09/09/15 0335  NA 135 140  K 4.4 4.2  CL 99* 111  CO2 23 24  GLUCOSE 103* 89  BUN 27* 22*   CREATININE 2.01* 1.28*  CALCIUM 8.8* 7.6*   Liver Function Tests:  Recent Labs Lab 09/08/15 0908 09/09/15 0335  AST 23 13*  ALT 14 11*  ALKPHOS 87 71  BILITOT 0.9 0.4  PROT 7.4 5.8*  ALBUMIN 3.7 2.6*   No results for input(s): LIPASE, AMYLASE in the last 168 hours. No results for input(s): AMMONIA in the last 168 hours. CBC:  Recent Labs Lab 09/08/15 0908 09/09/15 0335  WBC 27.2* 19.2*  NEUTROABS 23.1*  --   HGB 12.5 9.8*  HCT 38.1 30.7*  MCV 91.8 92.7  PLT 282 246   Cardiac Enzymes: No results for input(s): CKTOTAL, CKMB, CKMBINDEX, TROPONINI in the last 168 hours. BNP (last 3 results) No results for input(s): BNP in the last 8760 hours.  ProBNP (last 3 results) No results for input(s): PROBNP in the last 8760 hours.  CBG: No results for input(s): GLUCAP in the last 168 hours.  Recent Results (from the past 240 hour(s))  Blood Culture (routine x 2)     Status: None (Preliminary result)   Collection Time: 09/08/15  9:05 AM  Result Value Ref Range Status   Specimen Description BLOOD RIGHT ANTECUBITAL  Final   Special Requests BOTTLES DRAWN AEROBIC AND ANAEROBIC 5ML  Final   Culture   Final    NO GROWTH 1 DAY Performed at [facility]    Report Status PENDING  Incomplete  Blood Culture (routine x 2)  Status: None (Preliminary result)   Collection Time: 09/08/15  9:17 AM  Result Value Ref Range Status   Specimen Description BLOOD LEFT FOREARM  Final   Special Requests BOTTLES DRAWN AEROBIC AND ANAEROBIC 5CC  Final   Culture   Final    NO GROWTH 1 DAY Performed at [facility]    Report Status PENDING  Incomplete  MRSA PCR Screening     Status: None   Collection Time: 09/08/15  2:25 PM  Result Value Ref Range Status   MRSA by PCR NEGATIVE NEGATIVE Final    Comment:        The GeneXpert MRSA Assay (FDA approved for NASAL specimens only), is one component of a comprehensive MRSA colonization surveillance program. It is not intended  to diagnose MRSA infection nor to guide or monitor treatment for MRSA infections.      Studies: Dg Chest 2 View  09/08/2015  CLINICAL DATA:  Cough, congestion EXAM: CHEST  2 VIEW COMPARISON:  10/27/2014 FINDINGS: Cardiomediastinal silhouette is stable. Hyperinflation is noted. Bilateral perihilar bronchitic changes. There is right base posterior medially infiltrate suspicious for pneumonia best seen on lateral view. Follow-up to resolution is recommended. IMPRESSION: Hyperinflation is noted. Bilateral perihilar bronchitic changes. There is right base posterior medially infiltrate suspicious for pneumonia best seen on lateral view. Follow-up to resolution is recommended. Electronically Signed   By: Lahoma Crocker M.D.   On: 09/08/2015 09:53    Scheduled Meds: . azithromycin  250 mg Intravenous Q24H  . cefTRIAXone (ROCEPHIN)  IV  1 g Intravenous Q24H  . enoxaparin (LOVENOX) injection  40 mg Subcutaneous QHS  . fluconazole (DIFLUCAN) IV  100 mg Intravenous Q24H  . ipratropium-albuterol  3 mL Nebulization TID  . mometasone-formoterol  2 puff Inhalation BID  . sodium chloride  500 mL Intravenous Once   Continuous Infusions: . sodium chloride 100 mL/hr (09/09/15 1204)    Active Problems:   Sepsis ([place])    Time spent: 25 minutes.     [facility] Pager [phone] . If 7PM-7AM, please contact night-coverage at [email or web address], password [facility] 09/09/2015, 6:52 PM  LOS: 1 day

## 2015-09-09 NOTE — Care Management Important Message (Signed)
Important Message  Patient Details  Name: Tammy Ball MRN: [id number] Date of Birth: 26-Sep-1941   Medicare Important Message Given:  Yes-second notification given    Shelda Altes 09/09/2015, [place] Message  Patient Details  Name: Tammy Ball MRN: [id number] Date of Birth: 24-May-1941   Medicare Important Message Given:  Yes-second notification given    Shelda Altes 09/09/2015, 4:05 PM

## 2015-09-09 NOTE — Clinical Documentation Improvement (Signed)
Internal Medicine  Please provide a diagnosis associated with the below clinical indicators. Please update your documentation within the medical record to reflect your response to this query. Thank you!   Shock, including Type:  Septic, Cardiogenic, Hypovolemic, Other type, including suspected or known cause and/or associated condition(s)  Hypotensive as documented  Other  Clinically Undetermined  Document any associated diagnoses/conditions.  Supporting Information:  Hypotensive: 81/42 with Map of 53, HR > 100, RR > 26  Lactate 2.66  Fluid Resuscitation given  Antibiotics initiated  Please exercise your independent, professional judgment when responding. A specific answer is not anticipated or expected.  Thank You,  Zoila Shutter RN, [place] [phone]

## 2015-09-09 NOTE — Progress Notes (Signed)
Handout report given to Arcola Jansky, Therapist, sports. Patient transferring to room 1512 via wheelchair. Patient is alert and oriented.

## 2015-09-10 ENCOUNTER — Inpatient Hospital Stay (HOSPITAL_COMMUNITY): Payer: Medicare Other

## 2015-09-10 ENCOUNTER — Encounter (HOSPITAL_COMMUNITY): Payer: Self-pay | Admitting: Radiology

## 2015-09-10 DIAGNOSIS — N179 Acute kidney failure, unspecified: Secondary | ICD-10-CM | POA: Diagnosis present

## 2015-09-10 DIAGNOSIS — J189 Pneumonia, unspecified organism: Secondary | ICD-10-CM | POA: Diagnosis present

## 2015-09-10 DIAGNOSIS — D649 Anemia, unspecified: Secondary | ICD-10-CM | POA: Diagnosis present

## 2015-09-10 LAB — BASIC METABOLIC PANEL
Anion gap: 6 (ref 5–15)
BUN: 13 mg/dL (ref 6–20)
CO2: 27 mmol/L (ref 22–32)
Calcium: 8.5 mg/dL — ABNORMAL LOW (ref 8.9–10.3)
Chloride: 108 mmol/L (ref 101–111)
Creatinine, Ser: 0.98 mg/dL (ref 0.44–1.00)
GFR calc Af Amer: >60 mL/min (ref >60)
GFR calc non Af Amer: 57 mL/min — ABNORMAL LOW (ref >60)
Glucose, Bld: 106 mg/dL — ABNORMAL HIGH (ref 65–99)
Potassium: 4 mmol/L (ref 3.5–5.1)
Sodium: 141 mmol/L (ref 135–145)

## 2015-09-10 LAB — CBC WITH DIFFERENTIAL/PLATELET
Basophils Absolute: 0 10*3/uL (ref 0.0–0.1)
Basophils Relative: 0 %
Eosinophils Absolute: 0.3 10*3/uL (ref 0.0–0.7)
Eosinophils Relative: 3 %
HCT: 33.3 % — ABNORMAL LOW (ref 36.0–46.0)
Hemoglobin: 10.7 g/dL — ABNORMAL LOW (ref 12.0–15.0)
Lymphocytes Relative: 15 %
Lymphs Abs: 1.4 10*3/uL (ref 0.7–4.0)
MCH: 29.9 pg (ref 26.0–34.0)
MCHC: 32.1 g/dL (ref 30.0–36.0)
MCV: 93 fL (ref 78.0–100.0)
Monocytes Absolute: 0.7 10*3/uL (ref 0.1–1.0)
Monocytes Relative: 7 %
Neutro Abs: 7.1 10*3/uL (ref 1.7–7.7)
Neutrophils Relative %: 75 %
Platelets: 329 10*3/uL (ref 150–400)
RBC: 3.58 MIL/uL — ABNORMAL LOW (ref 3.87–5.11)
RDW: 15.6 % — ABNORMAL HIGH (ref 11.5–15.5)
WBC: 9.5 10*3/uL (ref 4.0–10.5)

## 2015-09-10 LAB — URINE CULTURE: Culture: NO GROWTH

## 2015-09-10 LAB — RESPIRATORY VIRUS PANEL
Adenovirus: NEGATIVE
Influenza A: NEGATIVE
Influenza B: NEGATIVE
Metapneumovirus: NEGATIVE
Parainfluenza 1: NEGATIVE
Parainfluenza 2: NEGATIVE
Parainfluenza 3: NEGATIVE
Respiratory Syncytial Virus A: NEGATIVE
Respiratory Syncytial Virus B: NEGATIVE
Rhinovirus: NEGATIVE

## 2015-09-10 MED ORDER — HYDROCOD POLST-CPM POLST ER 10-8 MG/5ML PO SUER
5.0000 mL | Freq: Two times a day (BID) | ORAL | Status: DC | PRN
  Administered 2015-09-10 – 2015-09-13 (×6): 5 mL via ORAL
  Filled 2015-09-10 (×6): qty 5

## 2015-09-10 MED ORDER — DEXTROSE 5 % IV SOLN
500.0000 mg | INTRAVENOUS | Status: DC
  Administered 2015-09-11: 500 mg via INTRAVENOUS
  Filled 2015-09-10 (×2): qty 500

## 2015-09-10 MED ORDER — PREDNISONE 20 MG PO TABS
40.0000 mg | ORAL_TABLET | Freq: Every day | ORAL | Status: AC
  Administered 2015-09-10 – 2015-09-12 (×3): 40 mg via ORAL
  Filled 2015-09-10 (×3): qty 2

## 2015-09-10 MED ORDER — BENZONATATE 100 MG PO CAPS
200.0000 mg | ORAL_CAPSULE | Freq: Three times a day (TID) | ORAL | Status: DC | PRN
  Administered 2015-09-10 – 2015-09-13 (×6): 200 mg via ORAL
  Filled 2015-09-10 (×6): qty 2

## 2015-09-10 MED ORDER — FLUTICASONE PROPIONATE 50 MCG/ACT NA SUSP
2.0000 | Freq: Every day | NASAL | Status: DC
  Administered 2015-09-10 – 2015-09-13 (×4): 2 via NASAL
  Filled 2015-09-10: qty 16

## 2015-09-10 NOTE — Progress Notes (Signed)
PHARMACY NOTE -  Azithromycin/Ceftriaxone  Assessment: Pharmacy has been assisting with dosing of Azithromycin and Ceftriaxone for CAP. - Day #3 antibiotics - Afebrile, WBC improved to WNL, LA improved to WNL, AKI resolving - Strep and Legionella urinary antigens negative - Respiratory virus panel negative - Urine culture negative - Blood cultures ngtd - MRSA screen negative  Plan: - Adjusting Azithromycin up to 500 mg IV q24h and continuing Ceftriaxone 1g IV q24h.  Since neither medication requires adjustment for renal function, further dosage adjustment appears unlikely at present so pharmacy will sign off.  Please reconsult if a change in clinical status warrants re-evaluation of dosage.  Hershal Coria, PharmD, BCPS Pager: [phone] 09/10/2015 3:14 PM

## 2015-09-10 NOTE — Progress Notes (Signed)
[facility] PROGRESS NOTE  Tammy Ball OBS:[id number] DOB: 04-07-1941 DOA: 09/08/2015 PCP: [name], MD  Assessment/Plan: Sepsis from acute respiratory failure from CAP: fever, hypotension, tachycardia,  Tachypnea and elevated lactic acid on admission, with CXR showing pneumonia.  Admitted to step down initially, transferred to tele on 10/28. Started on IV antibiotics, [place] oxygen to keep sats greater than 90%. Bronchodilators as needed. wheezing a little today, added oral prednisone with a quick taper.  Resume dulera.  Blood cultures and sputum cultures ordered, negative so far.  Influenza pcr is negative.  resp panel pending.  Resume antibiotics and taper the oxygen.  Get PT eval.  Repeat lactic acid is normal.  Urine for legionella and strep negative.  D/c telemetry.  D/c IV fluids as hypotension has resolved.    Mild epigastric pain: Unclear etiology. Resolved.     Acute renal failure: Probably from dehydration and infection.  Urine electrolytes UA,  REPEAT renal parameters  Improving.    Copd: Wheezing a little today added prednisone today.   Renal cancer s/p nephrectomy.  Viral syndrome: With myalgias, fevers.  Monitor and symptomatic management.   Chronic sinusitis: CT sinuses ordered, but she doesn't want any contrast with the study,.     Code Status: full code.  Family Communication: none at bedside Disposition Plan: pending PT eval.    Consultants:  PCCM.   Procedures:  none  Antibiotics:  Rocephin   Zithromax.   HPI/Subjective: Had a hard night with cough spells.   Objective: Filed Vitals:   09/10/15 1121  BP: 124/60  Pulse: 101  Temp:   Resp:     Intake/Output Summary (Last 24 hours) at 09/10/15 1137 Last data filed at 09/10/15 0443  Gross per 24 hour  Intake 1746.67 ml  Output   1150 ml  Net 596.67 ml   Filed Weights   09/08/15 0913  Weight: 80.74 kg (178 lb)    Exam:   General:  Alert  afebrile comfortable  Cardiovascular: s1s2  Respiratory: scattered wheezing heard.   Abdomen: soft non tender  Non distended bowel sounds.   Musculoskeletal: no pedal edema  Data Reviewed: Basic Metabolic Panel:  Recent Labs Lab 09/08/15 0908 09/09/15 0335  NA 135 140  K 4.4 4.2  CL 99* 111  CO2 23 24  GLUCOSE 103* 89  BUN 27* 22*  CREATININE 2.01* 1.28*  CALCIUM 8.8* 7.6*   Liver Function Tests:  Recent Labs Lab 09/08/15 0908 09/09/15 0335  AST 23 13*  ALT 14 11*  ALKPHOS 87 71  BILITOT 0.9 0.4  PROT 7.4 5.8*  ALBUMIN 3.7 2.6*   No results for input(s): LIPASE, AMYLASE in the last 168 hours. No results for input(s): AMMONIA in the last 168 hours. CBC:  Recent Labs Lab 09/08/15 0908 09/09/15 0335  WBC 27.2* 19.2*  NEUTROABS 23.1*  --   HGB 12.5 9.8*  HCT 38.1 30.7*  MCV 91.8 92.7  PLT 282 246   Cardiac Enzymes: No results for input(s): CKTOTAL, CKMB, CKMBINDEX, TROPONINI in the last 168 hours. BNP (last 3 results) No results for input(s): BNP in the last 8760 hours.  ProBNP (last 3 results) No results for input(s): PROBNP in the last 8760 hours.  CBG: No results for input(s): GLUCAP in the last 168 hours.  Recent Results (from the past 240 hour(s))  Blood Culture (routine x 2)     Status: None (Preliminary result)   Collection Time: 09/08/15  9:05 AM  Result Value Ref Range Status  Specimen Description BLOOD RIGHT ANTECUBITAL  Final   Special Requests BOTTLES DRAWN AEROBIC AND ANAEROBIC 5ML  Final   Culture   Final    NO GROWTH 1 DAY Performed at [facility]    Report Status PENDING  Incomplete  Blood Culture (routine x 2)     Status: None (Preliminary result)   Collection Time: 09/08/15  9:17 AM  Result Value Ref Range Status   Specimen Description BLOOD LEFT FOREARM  Final   Special Requests BOTTLES DRAWN AEROBIC AND ANAEROBIC 5CC  Final   Culture   Final    NO GROWTH 1 DAY Performed at [facility]    Report  Status PENDING  Incomplete  MRSA PCR Screening     Status: None   Collection Time: 09/08/15  2:25 PM  Result Value Ref Range Status   MRSA by PCR NEGATIVE NEGATIVE Final    Comment:        The GeneXpert MRSA Assay (FDA approved for NASAL specimens only), is one component of a comprehensive MRSA colonization surveillance program. It is not intended to diagnose MRSA infection nor to guide or monitor treatment for MRSA infections.   Respiratory virus panel     Status: None   Collection Time: 09/08/15  3:01 PM  Result Value Ref Range Status   Source - RVPAN NASOPHARYNGEAL SWAB  Corrected   Respiratory Syncytial Virus A Negative Negative Final   Respiratory Syncytial Virus B Negative Negative Final   Influenza A Negative Negative Final   Influenza B Negative Negative Final   Parainfluenza 1 Negative Negative Final   Parainfluenza 2 Negative Negative Final   Parainfluenza 3 Negative Negative Final   Metapneumovirus Negative Negative Final   Rhinovirus Negative Negative Final   Adenovirus Negative Negative Final    Comment: (NOTE) Performed At: [name] [address] [place], [place] [id number] Lindon Romp MD NK:[id number]   Urine culture     Status: None   Collection Time: 09/08/15  7:43 PM  Result Value Ref Range Status   Specimen Description URINE, CLEAN CATCH  Final   Special Requests NONE  Final   Culture   Final    NO GROWTH 1 DAY Performed at [facility]    Report Status 09/10/2015 FINAL  Final     Studies: No results found.  Scheduled Meds: . azithromycin  250 mg Intravenous Q24H  . cefTRIAXone (ROCEPHIN)  IV  1 g Intravenous Q24H  . enoxaparin (LOVENOX) injection  40 mg Subcutaneous QHS  . fluconazole (DIFLUCAN) IV  100 mg Intravenous Q24H  . fluticasone  2 spray Each Nare Daily  . ipratropium-albuterol  3 mL Nebulization TID  . mometasone-formoterol  2 puff Inhalation BID  . [START ON 09/11/2015] predniSONE  40 mg Oral QAC  breakfast  . sodium chloride  500 mL Intravenous Once   Continuous Infusions:    Active Problems:   Sepsis ([place])    Time spent: 25 minutes.     [facility] Pager [phone] . If 7PM-7AM, please contact night-coverage at [email or web address], password [email or web address] 09/10/2015, 11:37 AM  LOS: 2 days

## 2015-09-11 DIAGNOSIS — J439 Emphysema, unspecified: Secondary | ICD-10-CM

## 2015-09-11 DIAGNOSIS — J189 Pneumonia, unspecified organism: Secondary | ICD-10-CM

## 2015-09-11 DIAGNOSIS — N179 Acute kidney failure, unspecified: Secondary | ICD-10-CM

## 2015-09-11 LAB — BASIC METABOLIC PANEL
Anion gap: 6 (ref 5–15)
BUN: 13 mg/dL (ref 6–20)
CO2: 26 mmol/L (ref 22–32)
Calcium: 8.6 mg/dL — ABNORMAL LOW (ref 8.9–10.3)
Chloride: 106 mmol/L (ref 101–111)
Creatinine, Ser: 0.93 mg/dL (ref 0.44–1.00)
GFR calc Af Amer: >60 mL/min (ref >60)
GFR calc non Af Amer: >60 mL/min (ref >60)
Glucose, Bld: 137 mg/dL — ABNORMAL HIGH (ref 65–99)
Potassium: 4.7 mmol/L (ref 3.5–5.1)
Sodium: 138 mmol/L (ref 135–145)

## 2015-09-11 LAB — CBC
HCT: 30 % — ABNORMAL LOW (ref 36.0–46.0)
Hemoglobin: 9.9 g/dL — ABNORMAL LOW (ref 12.0–15.0)
MCH: 30.4 pg (ref 26.0–34.0)
MCHC: 33 g/dL (ref 30.0–36.0)
MCV: 92 fL (ref 78.0–100.0)
Platelets: 321 10*3/uL (ref 150–400)
RBC: 3.26 MIL/uL — ABNORMAL LOW (ref 3.87–5.11)
RDW: 15.4 % (ref 11.5–15.5)
WBC: 4.8 10*3/uL (ref 4.0–10.5)

## 2015-09-11 NOTE — Progress Notes (Signed)
[facility] PROGRESS NOTE  Tammy Ball TXM:[id number] DOB: [birth year] DOA: 09/08/2015 PCP: [name], MD  Assessment/Plan: Sepsis from acute respiratory failure from CAP: fever, hypotension, tachycardia,  Tachypnea and elevated lactic acid on admission, with CXR showing pneumonia.  Admitted to step down initially, transferred to tele on 10/28. Started on IV antibiotics, [place] oxygen to keep sats greater than 90%. Bronchodilators as needed. wheezing a little today, added oral prednisone with a quick taper.  Resume dulera.  Blood cultures and sputum cultures ordered, negative so far.  Influenza pcr is negative.  resp panel pending.  Resume antibiotics and taper the oxygen.  Get PT eval no needs identified.  Repeat lactic acid is normal.  Urine for legionella and strep negative.  D/c telemetry.  D/c IV fluids as hypotension has resolved.  We were not able to wean her off the oxygen.   Mild epigastric pain: Unclear etiology. Resolved.     Acute renal failure: Probably from dehydration and infection.  Urine electrolytes UA,  REPEAT renal parameters  Improving.    Copd: Wheezing a little today added prednisone .   Renal cancer s/p nephrectomy.  Viral syndrome: With myalgias, fevers.  Monitor and symptomatic management.   Chronic sinusitis: CT sinuses ordered, but she doesn't want any contrast with the study,.   CT sinuses showed no sinusitis.    Code Status: full code.  Family Communication: none at bedside Disposition Plan: pending PT eval.    Consultants:  PCCM.   Procedures:  none  Antibiotics:  Rocephin   Zithromax.   HPI/Subjective: Feeling better. Walked in the hallway.  Objective: Filed Vitals:   09/11/15 1247  BP: 153/85  Pulse: 108  Temp:   Resp: 20    Intake/Output Summary (Last 24 hours) at 09/11/15 1632 Last data filed at 09/11/15 0056  Gross per 24 hour  Intake    240 ml  Output      0 ml  Net    240 ml    Filed Weights   09/08/15 0913  Weight: 80.74 kg (178 lb)    Exam:   General:  Alert afebrile comfortable  Cardiovascular: s1s2  Respiratory: scattered wheezing heard.   Abdomen: soft non tender  Non distended bowel sounds.   Musculoskeletal: no pedal edema  Data Reviewed: Basic Metabolic Panel:  Recent Labs Lab 09/08/15 0908 09/09/15 0335 09/10/15 1230 09/11/15 0541  NA 135 140 141 138  K 4.4 4.2 4.0 4.7  CL 99* 111 108 106  CO2 '23 24 27 26  '$ GLUCOSE 103* 89 106* 137*  BUN 27* 22* 13 13  CREATININE 2.01* 1.28* 0.98 0.93  CALCIUM 8.8* 7.6* 8.5* 8.6*   Liver Function Tests:  Recent Labs Lab 09/08/15 0908 09/09/15 0335  AST 23 13*  ALT 14 11*  ALKPHOS 87 71  BILITOT 0.9 0.4  PROT 7.4 5.8*  ALBUMIN 3.7 2.6*   No results for input(s): LIPASE, AMYLASE in the last 168 hours. No results for input(s): AMMONIA in the last 168 hours. CBC:  Recent Labs Lab 09/08/15 0908 09/09/15 0335 09/10/15 1230 09/11/15 0541  WBC 27.2* 19.2* 9.5 4.8  NEUTROABS 23.1*  --  7.1  --   HGB 12.5 9.8* 10.7* 9.9*  HCT 38.1 30.7* 33.3* 30.0*  MCV 91.8 92.7 93.0 92.0  PLT 282 246 329 321   Cardiac Enzymes: No results for input(s): CKTOTAL, CKMB, CKMBINDEX, TROPONINI in the last 168 hours. BNP (last 3 results) No results for input(s): BNP in the last 8760 hours.  ProBNP (last 3 results) No results for input(s): PROBNP in the last 8760 hours.  CBG: No results for input(s): GLUCAP in the last 168 hours.  Recent Results (from the past 240 hour(s))  Blood Culture (routine x 2)     Status: None (Preliminary result)   Collection Time: 09/08/15  9:05 AM  Result Value Ref Range Status   Specimen Description BLOOD RIGHT ANTECUBITAL  Final   Special Requests BOTTLES DRAWN AEROBIC AND ANAEROBIC 5ML  Final   Culture   Final    NO GROWTH 3 DAYS Performed at [facility]    Report Status PENDING  Incomplete  Blood Culture (routine x 2)     Status: None (Preliminary  result)   Collection Time: 09/08/15  9:17 AM  Result Value Ref Range Status   Specimen Description BLOOD LEFT FOREARM  Final   Special Requests BOTTLES DRAWN AEROBIC AND ANAEROBIC 5CC  Final   Culture   Final    NO GROWTH 3 DAYS Performed at [facility]    Report Status PENDING  Incomplete  MRSA PCR Screening     Status: None   Collection Time: 09/08/15  2:25 PM  Result Value Ref Range Status   MRSA by PCR NEGATIVE NEGATIVE Final    Comment:        The GeneXpert MRSA Assay (FDA approved for NASAL specimens only), is one component of a comprehensive MRSA colonization surveillance program. It is not intended to diagnose MRSA infection nor to guide or monitor treatment for MRSA infections.   Respiratory virus panel     Status: None   Collection Time: 09/08/15  3:01 PM  Result Value Ref Range Status   Source - RVPAN NASOPHARYNGEAL SWAB  Corrected   Respiratory Syncytial Virus A Negative Negative Final   Respiratory Syncytial Virus B Negative Negative Final   Influenza A Negative Negative Final   Influenza B Negative Negative Final   Parainfluenza 1 Negative Negative Final   Parainfluenza 2 Negative Negative Final   Parainfluenza 3 Negative Negative Final   Metapneumovirus Negative Negative Final   Rhinovirus Negative Negative Final   Adenovirus Negative Negative Final    Comment: (NOTE) Performed At: [facility] [address] [place], [place] [id number] Lindon Romp MD GG:[id number]   Urine culture     Status: None   Collection Time: 09/08/15  7:43 PM  Result Value Ref Range Status   Specimen Description URINE, CLEAN CATCH  Final   Special Requests NONE  Final   Culture   Final    NO GROWTH 1 DAY Performed at [facility]    Report Status 10-03-15 FINAL  Final     Studies: Ct Maxillofacial Wo Cm  2015/10/03  CLINICAL DATA:  COPD, sinus infections, fever, chills, shortness of breath and cough. EXAM: CT MAXILLOFACIAL WITHOUT CONTRAST  TECHNIQUE: Multidetector CT imaging of the maxillofacial structures was performed. Multiplanar CT image reconstructions were also generated. A small metallic BB was placed on the right temple in order to reliably differentiate right from left. COMPARISON:  None. FINDINGS: Paranasal sinuses and mastoid air cells are normally aerated and show no evidence of mucosal thickening or air-fluid levels. The nasal septum is in the midline. No evidence of soft tissue masses or lymphadenopathy. The orbits are symmetric and normal in appearance bilaterally. The visualized cervical spine shows degenerative changes. Bones of the face are unremarkable. The temporomandibular joints show normal alignment. No inflammatory process, cellulitis or abscess is identified. IMPRESSION: Normal  maxillofacial CT. No evidence of sinusitis or other inflammatory process. Electronically Signed   By: Aletta Edouard M.D.   On: 09/10/2015 12:49    Scheduled Meds: . azithromycin  500 mg Intravenous Q24H  . cefTRIAXone (ROCEPHIN)  IV  1 g Intravenous Q24H  . enoxaparin (LOVENOX) injection  40 mg Subcutaneous QHS  . fluticasone  2 spray Each Nare Daily  . ipratropium-albuterol  3 mL Nebulization TID  . mometasone-formoterol  2 puff Inhalation BID  . predniSONE  40 mg Oral QAC breakfast  . sodium chloride  500 mL Intravenous Once   Continuous Infusions:    Active Problems:   Hypertension   COPD (chronic obstructive pulmonary disease) ([place])   Renal cell cancer (HCC)   Hyperlipidemia   Sepsis ([place])   CAP (community acquired pneumonia)   Anemia   Acute renal failure ([place])    Time spent: 15 minutes.     [facility] Pager [phone] . If 7PM-7AM, please contact night-coverage at [email or web address], password [facility] 09/11/2015, 4:32 PM  LOS: 3 days

## 2015-09-11 NOTE — Progress Notes (Signed)
PT Cancellation Note  Patient Details Name: Tammy Ball MRN: [id number] DOB: Mar 07, 1941   Cancelled Treatment:    Reason Eval/Treat Not Completed: PT screened, no needs identified, will sign off (pat is up ad lib, ambulated in hall w/ son. )   Claretha Cooper 09/11/2015, 2:18 PM Tresa Endo PT [phone]

## 2015-09-12 ENCOUNTER — Inpatient Hospital Stay (HOSPITAL_COMMUNITY): Payer: Medicare Other

## 2015-09-12 LAB — IRON AND TIBC
Iron: 80 ug/dL (ref 28–170)
Saturation Ratios: 24 % (ref 10.4–31.8)
TIBC: 332 ug/dL (ref 250–450)
UIBC: 252 ug/dL

## 2015-09-12 LAB — RETICULOCYTES
RBC.: 3.65 MIL/uL — ABNORMAL LOW (ref 3.87–5.11)
Retic Count, Absolute: 43.8 10*3/uL (ref 19.0–186.0)
Retic Ct Pct: 1.2 % (ref 0.4–3.1)

## 2015-09-12 LAB — FERRITIN: Ferritin: 136 ng/mL (ref 11–307)

## 2015-09-12 LAB — FOLATE: Folate: 10.1 ng/mL (ref >5.9)

## 2015-09-12 LAB — VITAMIN B12: Vitamin B-12: 1145 pg/mL — ABNORMAL HIGH (ref 180–914)

## 2015-09-12 MED ORDER — AZITHROMYCIN 500 MG PO TABS
500.0000 mg | ORAL_TABLET | Freq: Every day | ORAL | Status: DC
  Administered 2015-09-13: 500 mg via ORAL
  Filled 2015-09-12: qty 1

## 2015-09-12 NOTE — Progress Notes (Signed)
Attempted to wean patient oxygen.  Decreased oxygen to 1L via nasal cannula.  Reassessed patient in 30 minutes and SpO2 had decreased to 87%.  No respiratory distress noted.  Increased to 2L/min and MD notified.

## 2015-09-12 NOTE — Progress Notes (Signed)
[facility] PROGRESS NOTE  Tammy Ball MCN:[id number] DOB: Aug 09, 1941 DOA: 09/08/2015 PCP: [name], MD  Assessment/Plan: Septic shock from acute respiratory failure from CAP: fever, hypotension, tachycardia,  Tachypnea and elevated lactic acid on admission, with CXR showing pneumonia.  Admitted to step down initially, transferred to tele on 10/28. Started on IV antibiotics, [place] oxygen to keep sats greater than 90%. Bronchodilators as needed. She was wheezing two days ago, added oral prednisone with a quick taper.  Resume dulera.  Blood cultures and sputum cultures ordered, negative so far.  Influenza pcr is negative.  resp panel negative. Resume antibiotics and taper the oxygen.  Get PT eval no needs identified.  Repeat lactic acid is normal.  Urine for legionella and strep negative.  D/c telemetry.  D/c IV fluids as hypotension has resolved.  We were not able to wean her off the oxygen today, repeat CXR and resume antibiotics for one more day.   Mild epigastric pain: Unclear etiology. Resolved.     Acute renal failure: Probably from dehydration and infection.  Urine electrolytes UA,  REPEAT renal parameters  Improving.    Copd: No wheezing today, quick steroid taper.   Renal cancer s/p nephrectomy.  Viral syndrome: With myalgias, fevers.  Monitor and symptomatic management.   Chronic sinusitis: CT sinuses ordered, but she doesn't want any contrast with the study,.   CT sinuses showed no sinusitis.    Code Status: full code.  Family Communication: son at bedside Disposition Plan: home tomorrow.    Consultants:  PCCM.   Procedures:  none  Antibiotics:  Rocephin   Zithromax.   HPI/Subjective: Feeling better. Walked in the hallway.but sats dropped to 80% on ambulation without oxygen.  Objective: Filed Vitals:   09/12/15 0525  BP: 123/70  Pulse: 85  Temp: 98.3 F (36.8 C)  Resp: 19    Intake/Output Summary (Last 24  hours) at 09/12/15 1036 Last data filed at 09/12/15 0345  Gross per 24 hour  Intake    480 ml  Output      0 ml  Net    480 ml   Filed Weights   09/08/15 0913  Weight: 80.74 kg (178 lb)    Exam:   General:  Alert afebrile comfortable  Cardiovascular: s1s2  Respiratory: scattered wheezing heard.   Abdomen: soft non tender  Non distended bowel sounds.   Musculoskeletal: no pedal edema  Data Reviewed: Basic Metabolic Panel:  Recent Labs Lab 09/08/15 0908 09/09/15 0335 09/10/15 1230 09/11/15 0541  NA 135 140 141 138  K 4.4 4.2 4.0 4.7  CL 99* 111 108 106  CO2 '23 24 27 26  '$ GLUCOSE 103* 89 106* 137*  BUN 27* 22* 13 13  CREATININE 2.01* 1.28* 0.98 0.93  CALCIUM 8.8* 7.6* 8.5* 8.6*   Liver Function Tests:  Recent Labs Lab 09/08/15 0908 09/09/15 0335  AST 23 13*  ALT 14 11*  ALKPHOS 87 71  BILITOT 0.9 0.4  PROT 7.4 5.8*  ALBUMIN 3.7 2.6*   No results for input(s): LIPASE, AMYLASE in the last 168 hours. No results for input(s): AMMONIA in the last 168 hours. CBC:  Recent Labs Lab 09/08/15 0908 09/09/15 0335 09/10/15 1230 09/11/15 0541  WBC 27.2* 19.2* 9.5 4.8  NEUTROABS 23.1*  --  7.1  --   HGB 12.5 9.8* 10.7* 9.9*  HCT 38.1 30.7* 33.3* 30.0*  MCV 91.8 92.7 93.0 92.0  PLT 282 246 329 321   Cardiac Enzymes: No results for input(s): CKTOTAL, CKMB,  CKMBINDEX, TROPONINI in the last 168 hours. BNP (last 3 results) No results for input(s): BNP in the last 8760 hours.  ProBNP (last 3 results) No results for input(s): PROBNP in the last 8760 hours.  CBG: No results for input(s): GLUCAP in the last 168 hours.  Recent Results (from the past 240 hour(s))  Blood Culture (routine x 2)     Status: None (Preliminary result)   Collection Time: 09/08/15  9:05 AM  Result Value Ref Range Status   Specimen Description BLOOD RIGHT ANTECUBITAL  Final   Special Requests BOTTLES DRAWN AEROBIC AND ANAEROBIC 5ML  Final   Culture   Final    NO GROWTH 4  DAYS Performed at [facility]    Report Status PENDING  Incomplete  Blood Culture (routine x 2)     Status: None (Preliminary result)   Collection Time: 09/08/15  9:17 AM  Result Value Ref Range Status   Specimen Description BLOOD LEFT FOREARM  Final   Special Requests BOTTLES DRAWN AEROBIC AND ANAEROBIC 5CC  Final   Culture   Final    NO GROWTH 4 DAYS Performed at [facility]    Report Status PENDING  Incomplete  MRSA PCR Screening     Status: None   Collection Time: 09/08/15  2:25 PM  Result Value Ref Range Status   MRSA by PCR NEGATIVE NEGATIVE Final    Comment:        The GeneXpert MRSA Assay (FDA approved for NASAL specimens only), is one component of a comprehensive MRSA colonization surveillance program. It is not intended to diagnose MRSA infection nor to guide or monitor treatment for MRSA infections.   Respiratory virus panel     Status: None   Collection Time: 09/08/15  3:01 PM  Result Value Ref Range Status   Source - RVPAN NASOPHARYNGEAL SWAB  Corrected   Respiratory Syncytial Virus A Negative Negative Final   Respiratory Syncytial Virus B Negative Negative Final   Influenza A Negative Negative Final   Influenza B Negative Negative Final   Parainfluenza 1 Negative Negative Final   Parainfluenza 2 Negative Negative Final   Parainfluenza 3 Negative Negative Final   Metapneumovirus Negative Negative Final   Rhinovirus Negative Negative Final   Adenovirus Negative Negative Final    Comment: (NOTE) Performed At: [facility] [address] [place], [place] [id number] Lindon Romp MD YS:[id number]   Urine culture     Status: None   Collection Time: 09/08/15  7:43 PM  Result Value Ref Range Status   Specimen Description URINE, CLEAN CATCH  Final   Special Requests NONE  Final   Culture   Final    NO GROWTH 1 DAY Performed at [facility]    Report Status 09/10/2015 FINAL  Final     Studies: Ct Maxillofacial Wo  Cm  09/10/2015  CLINICAL DATA:  COPD, sinus infections, fever, chills, shortness of breath and cough. EXAM: CT MAXILLOFACIAL WITHOUT CONTRAST TECHNIQUE: Multidetector CT imaging of the maxillofacial structures was performed. Multiplanar CT image reconstructions were also generated. A small metallic BB was placed on the right temple in order to reliably differentiate right from left. COMPARISON:  None. FINDINGS: Paranasal sinuses and mastoid air cells are normally aerated and show no evidence of mucosal thickening or air-fluid levels. The nasal septum is in the midline. No evidence of soft tissue masses or lymphadenopathy. The orbits are symmetric and normal in appearance bilaterally. The visualized cervical spine shows degenerative changes.  Bones of the face are unremarkable. The temporomandibular joints show normal alignment. No inflammatory process, cellulitis or abscess is identified. IMPRESSION: Normal maxillofacial CT. No evidence of sinusitis or other inflammatory process. Electronically Signed   By: Aletta Edouard M.D.   On: 09/10/2015 12:49    Scheduled Meds: . [START ON 09/13/2015] azithromycin  500 mg Oral Daily  . cefTRIAXone (ROCEPHIN)  IV  1 g Intravenous Q24H  . enoxaparin (LOVENOX) injection  40 mg Subcutaneous QHS  . fluticasone  2 spray Each Nare Daily  . ipratropium-albuterol  3 mL Nebulization TID  . mometasone-formoterol  2 puff Inhalation BID  . sodium chloride  500 mL Intravenous Once   Continuous Infusions:    Active Problems:   Hypertension   COPD (chronic obstructive pulmonary disease) ([place])   Renal cell cancer (HCC)   Hyperlipidemia   Sepsis ([place])   CAP (community acquired pneumonia)   Anemia   Acute renal failure ([place])    Time spent: 15 minutes.     [facility] Pager [phone] . If 7PM-7AM, please contact night-coverage at [email or web address], password [facility] 09/12/2015, 10:36 AM  LOS: 4 days

## 2015-09-13 LAB — CULTURE, BLOOD (ROUTINE X 2)
Culture: NO GROWTH
Culture: NO GROWTH

## 2015-09-13 MED ORDER — AMOXICILLIN-POT CLAVULANATE 875-125 MG PO TABS: 1.0000 | ORAL_TABLET | Freq: Two times a day (BID) | ORAL | Status: DC

## 2015-09-13 MED ORDER — FLUTICASONE PROPIONATE 50 MCG/ACT NA SUSP: 2.0000 | Freq: Every day | NASAL | Status: DC

## 2015-09-13 MED ORDER — PREDNISONE 20 MG PO TABS
40.0000 mg | ORAL_TABLET | Freq: Every day | ORAL | Status: DC
  Filled 2015-09-13: qty 2

## 2015-09-13 MED ORDER — PREDNISONE 20 MG PO TABS
40.0000 mg | ORAL_TABLET | Freq: Every day | ORAL | Status: DC
  Administered 2015-09-13: 40 mg via ORAL
  Filled 2015-09-13 (×2): qty 2

## 2015-09-13 MED ORDER — BENZONATATE 200 MG PO CAPS: 200.0000 mg | ORAL_CAPSULE | Freq: Three times a day (TID) | ORAL | Status: DC | PRN

## 2015-09-13 MED ORDER — IPRATROPIUM-ALBUTEROL 0.5-2.5 (3) MG/3ML IN SOLN: 3.0000 mL | Freq: Three times a day (TID) | RESPIRATORY_TRACT | Status: DC

## 2015-09-13 MED ORDER — PREDNISONE 20 MG PO TABS: 40.0000 mg | ORAL_TABLET | Freq: Every day | ORAL | Status: DC

## 2015-09-13 MED ORDER — HYDROCOD POLST-CPM POLST ER 10-8 MG/5ML PO SUER: 5.0000 mL | Freq: Two times a day (BID) | ORAL | Status: DC | PRN

## 2015-09-13 NOTE — Discharge Summary (Signed)
Physician Discharge Summary  Tammy Ball JWJ:[id number] DOB: Jan 12, 1941 DOA: 09/08/2015  PCP: [name], MD  Admit date: 09/08/2015 Discharge date: 09/13/2015  Time spent: 30 minutes  Recommendations for Outpatient Follow-up:  1. Follow up with PCP in one week.   Discharge Diagnoses:  Active Problems:   Hypertension   COPD (chronic obstructive pulmonary disease) ([place])   Renal cell cancer ([place])   Hyperlipidemia   Sepsis ([place])   CAP (community acquired pneumonia)   Anemia   Acute renal failure ([place])   Discharge Condition: improved  Diet recommendation: regular diet.   Filed Weights   09/08/15 0913  Weight: 80.74 kg (178 lb)    History of present illness:  Tammy Ball is a 74 y.o. female with h/o copd, frequent sinus infections, hypertension, presented to ED with 2 days of fevers and chills, sob and cough. On arrival to ED, she was febrile, tachycardic, hypotensive, hypoxic and witht elevated lactic acid.  She was found to have pneumonia on CXR.  She was referred to medical service for sepsis from CAP. PCCM consulted for recommendations.   Hospital Course:  Septic shock from acute respiratory failure from CAP: fever, hypotension, tachycardia, Tachypnea and elevated lactic acid on admission, with CXR showing pneumonia.  Admitted to step down initially, transferred to tele on 10/28. Started on IV antibiotics, [place] oxygen to keep sats greater than 90%. Bronchodilators as needed. She was wheezing two days ago, added oral prednisone with a quick taper.  Resume dulera.  Blood cultures and sputum cultures ordered, negative so far.  Influenza pcr is negative.  resp panel negative.  PT eval no needs identified.  Repeat lactic acid is normal.  Urine for legionella and strep negative.  D/c IV fluids as hypotension has resolved.  We were not able to wean her off the oxygen today, she was discharged on home oxygen.  Repeat CXR showed improved pneumonia.    Mild epigastric pain: Unclear etiology. Resolved.     Acute renal failure: Probably from dehydration and infection.  Urine electrolytes UA,  REPEAT renal parameters Improving.    Copd: No wheezing today, quick steroid taper.   Renal cancer s/p nephrectomy.  Viral syndrome: With myalgias, fevers.  Resolved.   Chronic sinusitis: CT sinuses ordered, but she doesn't want any contrast with the study,.  CT sinuses showed no sinusitis.   Procedures: none Consultations:  none  Discharge Exam: Filed Vitals:   09/13/15 0559  BP: 127/70  Pulse: 79  Temp: 98.1 F (36.7 C)  Resp: 20    General: alert afebrile comfortable.  Cardiovascular: s1s2 Respiratory: ctab  Discharge Instructions   Discharge Instructions    Diet - low sodium heart healthy    Complete by:  As directed      Discharge instructions    Complete by:  As directed   Follow up with PCP in one week.          Current Discharge Medication List    START taking these medications   Details  amoxicillin-clavulanate (AUGMENTIN) 875-125 MG tablet Take 1 tablet by mouth 2 (two) times daily. Qty: 6 tablet, Refills: 0    benzonatate (TESSALON) 200 MG capsule Take 1 capsule (200 mg total) by mouth 3 (three) times daily as needed for cough. Qty: 20 capsule, Refills: 0    fluticasone (FLONASE) 50 MCG/ACT nasal spray Place 2 sprays into both nostrils daily. Qty: 9.9 g, Refills: 0    ipratropium-albuterol (DUONEB) 0.5-2.5 (3) MG/3ML SOLN Take 3 mLs by nebulization  3 (three) times daily. Qty: 360 mL, Refills: 1    predniSONE (DELTASONE) 20 MG tablet Take 2 tablets (40 mg total) by mouth daily before breakfast. Qty: 5 tablet, Refills: 0      CONTINUE these medications which have CHANGED   Details  chlorpheniramine-HYDROcodone (TUSSIONEX PENNKINETIC ER) 10-8 MG/5ML SUER Take 5 mLs by mouth every 12 (twelve) hours as needed for cough. Qty: 60 mL, Refills: 0      CONTINUE these medications which  have NOT CHANGED   Details  acetaminophen (TYLENOL) 500 MG tablet Take 1,000 mg by mouth every 6 (six) hours as needed for mild pain, moderate pain, fever or headache.    albuterol (PROVENTIL HFA;VENTOLIN HFA) 108 (90 BASE) MCG/ACT inhaler Inhale 2 puffs into the lungs every 4 (four) hours as needed for wheezing or shortness of breath. Qty: 1 Inhaler, Refills: 0   Associated Diagnoses: Asthma    aspirin EC 81 MG tablet Take 81 mg by mouth daily.    atorvastatin (LIPITOR) 40 MG tablet TAKE ONE TABLET BY MOUTH ONCE DAILY. Qty: 30 tablet, Refills: 2    benazepril (LOTENSIN) 40 MG tablet Take 1 tablet (40 mg total) by mouth daily. Qty: 90 tablet, Refills: 0   Associated Diagnoses: Essential hypertension    CALCIUM PO Take 1 tablet by mouth daily.    Cholecalciferol (VITAMIN D-3 PO) Take 1 tablet by mouth daily.    fish oil-omega-3 fatty acids 1000 MG capsule Take 1 g by mouth daily.      mometasone-formoterol (DULERA) 200-5 MCG/ACT AERO Inhale 2 puffs into the lungs 2 (two) times daily. Qty: 1 Inhaler, Refills: 3   Associated Diagnoses: Acute bronchitis, unspecified organism    polyvinyl alcohol-povidone (REFRESH) 1.4-0.6 % ophthalmic solution Place 2 drops into both eyes daily as needed (for dry eyes).       Allergies  Allergen Reactions  . Bee Venom Anaphylaxis, Shortness Of Breath and Swelling    Swelling at site   . Levofloxacin Other (See Comments)    Joint pain   . Amlodipine Swelling    Swelling of the ankles and hands   . Hctz [Hydrochlorothiazide] Palpitations and Other (See Comments)    Sweating    Follow-up Information    Follow up with [name], MD. Schedule an appointment as soon as possible for a visit in 1 week.   Specialty:  Family Medicine   Contact information:   [place] [address] [phone]        The results of significant diagnostics from this hospitalization (including imaging, microbiology, ancillary and laboratory)  are listed below for reference.    Significant Diagnostic Studies: Dg Chest 2 View  09/12/2015  CLINICAL DATA:  Recent pneumonia.  History of renal carcinoma EXAM: CHEST  2 VIEW COMPARISON:  September 08, 2015 FINDINGS: There is persistent posterior right base infiltrate, perhaps minimally less than on recent prior study. Elsewhere the interstitium is diffusely prominent. No other airspace consolidation is seen, however. The heart size and pulmonary vascularity are normal. No adenopathy. There is postoperative change in the lower cervical spine region. IMPRESSION: Marginally less infiltrate in the posterior right base compared to recent prior study. The interstitium in the mid lower lung zones is prominent. These changes probably represent a degree of underlying interstitial fibrotic change. No new opacity. No change in cardiac silhouette. Electronically Signed   By: Lowella Grip III M.D.   On: 09/12/2015 12:01   Dg Chest 2 View  09/08/2015  CLINICAL DATA:  Cough, congestion EXAM: CHEST  2 VIEW COMPARISON:  10/27/2014 FINDINGS: Cardiomediastinal silhouette is stable. Hyperinflation is noted. Bilateral perihilar bronchitic changes. There is right base posterior medially infiltrate suspicious for pneumonia best seen on lateral view. Follow-up to resolution is recommended. IMPRESSION: Hyperinflation is noted. Bilateral perihilar bronchitic changes. There is right base posterior medially infiltrate suspicious for pneumonia best seen on lateral view. Follow-up to resolution is recommended. Electronically Signed   By: Lahoma Crocker M.D.   On: 09/08/2015 09:53   Ct Maxillofacial Wo Cm  09/10/2015  CLINICAL DATA:  COPD, sinus infections, fever, chills, shortness of breath and cough. EXAM: CT MAXILLOFACIAL WITHOUT CONTRAST TECHNIQUE: Multidetector CT imaging of the maxillofacial structures was performed. Multiplanar CT image reconstructions were also generated. A small metallic BB was placed on the right temple  in order to reliably differentiate right from left. COMPARISON:  None. FINDINGS: Paranasal sinuses and mastoid air cells are normally aerated and show no evidence of mucosal thickening or air-fluid levels. The nasal septum is in the midline. No evidence of soft tissue masses or lymphadenopathy. The orbits are symmetric and normal in appearance bilaterally. The visualized cervical spine shows degenerative changes. Bones of the face are unremarkable. The temporomandibular joints show normal alignment. No inflammatory process, cellulitis or abscess is identified. IMPRESSION: Normal maxillofacial CT. No evidence of sinusitis or other inflammatory process. Electronically Signed   By: Aletta Edouard M.D.   On: 09/10/2015 12:49    Microbiology: Recent Results (from the past 240 hour(s))  Blood Culture (routine x 2)     Status: None (Preliminary result)   Collection Time: 09/08/15  9:05 AM  Result Value Ref Range Status   Specimen Description BLOOD RIGHT ANTECUBITAL  Final   Special Requests BOTTLES DRAWN AEROBIC AND ANAEROBIC 5ML  Final   Culture   Final    NO GROWTH 4 DAYS Performed at [facility]    Report Status PENDING  Incomplete  Blood Culture (routine x 2)     Status: None (Preliminary result)   Collection Time: 09/08/15  9:17 AM  Result Value Ref Range Status   Specimen Description BLOOD LEFT FOREARM  Final   Special Requests BOTTLES DRAWN AEROBIC AND ANAEROBIC 5CC  Final   Culture   Final    NO GROWTH 4 DAYS Performed at [facility]    Report Status PENDING  Incomplete  MRSA PCR Screening     Status: None   Collection Time: 09/08/15  2:25 PM  Result Value Ref Range Status   MRSA by PCR NEGATIVE NEGATIVE Final    Comment:        The GeneXpert MRSA Assay (FDA approved for NASAL specimens only), is one component of a comprehensive MRSA colonization surveillance program. It is not intended to diagnose MRSA infection nor to guide or monitor treatment for MRSA  infections.   Respiratory virus panel     Status: None   Collection Time: 09/08/15  3:01 PM  Result Value Ref Range Status   Source - RVPAN NASOPHARYNGEAL SWAB  Corrected   Respiratory Syncytial Virus A Negative Negative Final   Respiratory Syncytial Virus B Negative Negative Final   Influenza A Negative Negative Final   Influenza B Negative Negative Final   Parainfluenza 1 Negative Negative Final   Parainfluenza 2 Negative Negative Final   Parainfluenza 3 Negative Negative Final   Metapneumovirus Negative Negative Final   Rhinovirus Negative Negative Final   Adenovirus Negative Negative Final    Comment: (NOTE) Performed  At: [facility] [place], [place] [id number] Lindon Romp MD NO:[id number]   Urine culture     Status: None   Collection Time: 09/08/15  7:43 PM  Result Value Ref Range Status   Specimen Description URINE, CLEAN CATCH  Final   Special Requests NONE  Final   Culture   Final    NO GROWTH 1 DAY Performed at [facility]    Report Status 09/10/2015 FINAL  Final     Labs: Basic Metabolic Panel:  Recent Labs Lab 09/08/15 0908 09/09/15 0335 09/10/15 1230 09/11/15 0541  NA 135 140 141 138  K 4.4 4.2 4.0 4.7  CL 99* 111 108 106  CO2 '23 24 27 26  '$ GLUCOSE 103* 89 106* 137*  BUN 27* 22* 13 13  CREATININE 2.01* 1.28* 0.98 0.93  CALCIUM 8.8* 7.6* 8.5* 8.6*   Liver Function Tests:  Recent Labs Lab 09/08/15 0908 09/09/15 0335  AST 23 13*  ALT 14 11*  ALKPHOS 87 71  BILITOT 0.9 0.4  PROT 7.4 5.8*  ALBUMIN 3.7 2.6*   No results for input(s): LIPASE, AMYLASE in the last 168 hours. No results for input(s): AMMONIA in the last 168 hours. CBC:  Recent Labs Lab 09/08/15 0908 09/09/15 0335 09/10/15 1230 09/11/15 0541  WBC 27.2* 19.2* 9.5 4.8  NEUTROABS 23.1*  --  7.1  --   HGB 12.5 9.8* 10.7* 9.9*  HCT 38.1 30.7* 33.3* 30.0*  MCV 91.8 92.7 93.0 92.0  PLT 282 246 329 321   Cardiac Enzymes: No results for  input(s): CKTOTAL, CKMB, CKMBINDEX, TROPONINI in the last 168 hours. BNP: BNP (last 3 results) No results for input(s): BNP in the last 8760 hours.  ProBNP (last 3 results) No results for input(s): PROBNP in the last 8760 hours.  CBG: No results for input(s): GLUCAP in the last 168 hours.     SignedHosie Poisson  [facility] 09/13/2015, 10:14 AM

## 2015-09-13 NOTE — Progress Notes (Signed)
SATURATION QUALIFICATIONS: (This note is used to comply with regulatory documentation for home oxygen)  Patient Saturations on Room Air at Rest =  92%  Patient Saturations on Room Air while Ambulating = 81%  Patient Saturations on 1 Liters of oxygen while Ambulating = 90%  Please briefly explain why patient needs home oxygen:

## 2015-09-13 NOTE — Care Management Important Message (Signed)
Important Message  Patient Details  Name: Tammy Ball MRN: [id number] Date of Birth: 1941/10/24   Medicare Important Message Given:  Yes-second notification given    Camillo Flaming 09/13/2015, 1:16 PMImportant Message  Patient Details  Name: Tammy Ball MRN: [id number] Date of Birth: [birth year]   Medicare Important Message Given:  Yes-second notification given    Camillo Flaming 09/13/2015, 1:16 PM

## 2015-09-14 ENCOUNTER — Telehealth: Payer: Self-pay

## 2015-09-14 NOTE — Telephone Encounter (Signed)
Please advise 

## 2015-09-14 NOTE — Telephone Encounter (Signed)
Patient was discharged from the hospital on 09/12/2015. She states that her feet are very swollen and she she has gained 9lbs since being weighed last week while in the hospital. She has an appointment with Philis Fendt, PA-C on 09/20/2015 however she wants some advice on how to appropriately treat her condition until she comes in next week. Advised patient she will probably need to be seen in order to get a clear diagnosis and receive proper treatment. However, patient requests that we call her first.   [phone]

## 2015-09-14 NOTE — Telephone Encounter (Signed)
Patient needs to be seen sooner and should come to walk-in office. Please advise.

## 2015-09-15 NOTE — Telephone Encounter (Signed)
Spoke with pt, advised her to RTC. Pt understood. 

## 2015-09-19 ENCOUNTER — Encounter (HOSPITAL_COMMUNITY): Payer: Self-pay | Admitting: Emergency Medicine

## 2015-09-19 ENCOUNTER — Emergency Department (HOSPITAL_COMMUNITY): Payer: Medicare Other

## 2015-09-19 ENCOUNTER — Observation Stay (HOSPITAL_COMMUNITY)
Admission: EM | Admit: 2015-09-19 | Discharge: 2015-09-21 | Disposition: A | Discharge status: Home / Self Care | Payer: Medicare Other | Attending: Internal Medicine | Admitting: Internal Medicine

## 2015-09-19 DIAGNOSIS — Z85528 Personal history of other malignant neoplasm of kidney: Secondary | ICD-10-CM | POA: Diagnosis not present

## 2015-09-19 DIAGNOSIS — Z91030 Bee allergy status: Secondary | ICD-10-CM | POA: Insufficient documentation

## 2015-09-19 DIAGNOSIS — F329 Major depressive disorder, single episode, unspecified: Secondary | ICD-10-CM | POA: Diagnosis not present

## 2015-09-19 DIAGNOSIS — Z8701 Personal history of pneumonia (recurrent): Secondary | ICD-10-CM | POA: Insufficient documentation

## 2015-09-19 DIAGNOSIS — Z9981 Dependence on supplemental oxygen: Secondary | ICD-10-CM | POA: Insufficient documentation

## 2015-09-19 DIAGNOSIS — I10 Essential (primary) hypertension: Secondary | ICD-10-CM | POA: Diagnosis not present

## 2015-09-19 DIAGNOSIS — J449 Chronic obstructive pulmonary disease, unspecified: Secondary | ICD-10-CM | POA: Diagnosis present

## 2015-09-19 DIAGNOSIS — Z7982 Long term (current) use of aspirin: Secondary | ICD-10-CM | POA: Insufficient documentation

## 2015-09-19 DIAGNOSIS — Z87891 Personal history of nicotine dependence: Secondary | ICD-10-CM | POA: Diagnosis not present

## 2015-09-19 DIAGNOSIS — R918 Other nonspecific abnormal finding of lung field: Secondary | ICD-10-CM | POA: Diagnosis present

## 2015-09-19 DIAGNOSIS — J45909 Unspecified asthma, uncomplicated: Secondary | ICD-10-CM | POA: Insufficient documentation

## 2015-09-19 DIAGNOSIS — C642 Malignant neoplasm of left kidney, except renal pelvis: Secondary | ICD-10-CM

## 2015-09-19 DIAGNOSIS — R011 Cardiac murmur, unspecified: Secondary | ICD-10-CM | POA: Diagnosis not present

## 2015-09-19 DIAGNOSIS — R0781 Pleurodynia: Secondary | ICD-10-CM | POA: Diagnosis not present

## 2015-09-19 DIAGNOSIS — J90 Pleural effusion, not elsewhere classified: Secondary | ICD-10-CM

## 2015-09-19 DIAGNOSIS — Z79899 Other long term (current) drug therapy: Secondary | ICD-10-CM | POA: Diagnosis not present

## 2015-09-19 DIAGNOSIS — C3401 Malignant neoplasm of right main bronchus: Secondary | ICD-10-CM | POA: Diagnosis not present

## 2015-09-19 DIAGNOSIS — Z888 Allergy status to other drugs, medicaments and biological substances status: Secondary | ICD-10-CM | POA: Diagnosis not present

## 2015-09-19 DIAGNOSIS — J440 Chronic obstructive pulmonary disease with acute lower respiratory infection: Secondary | ICD-10-CM | POA: Diagnosis not present

## 2015-09-19 DIAGNOSIS — J439 Emphysema, unspecified: Secondary | ICD-10-CM | POA: Diagnosis not present

## 2015-09-19 DIAGNOSIS — E785 Hyperlipidemia, unspecified: Secondary | ICD-10-CM | POA: Insufficient documentation

## 2015-09-19 DIAGNOSIS — R091 Pleurisy: Secondary | ICD-10-CM

## 2015-09-19 DIAGNOSIS — Z905 Acquired absence of kidney: Secondary | ICD-10-CM | POA: Insufficient documentation

## 2015-09-19 DIAGNOSIS — Z881 Allergy status to other antibiotic agents status: Secondary | ICD-10-CM | POA: Insufficient documentation

## 2015-09-19 DIAGNOSIS — J9811 Atelectasis: Secondary | ICD-10-CM | POA: Insufficient documentation

## 2015-09-19 DIAGNOSIS — C649 Malignant neoplasm of unspecified kidney, except renal pelvis: Secondary | ICD-10-CM | POA: Diagnosis present

## 2015-09-19 DIAGNOSIS — R079 Chest pain, unspecified: Secondary | ICD-10-CM | POA: Diagnosis not present

## 2015-09-19 DIAGNOSIS — R0789 Other chest pain: Secondary | ICD-10-CM | POA: Diagnosis not present

## 2015-09-19 DIAGNOSIS — R0602 Shortness of breath: Secondary | ICD-10-CM | POA: Diagnosis not present

## 2015-09-19 LAB — CBC
HCT: 40.4 % (ref 36.0–46.0)
Hemoglobin: 13.1 g/dL (ref 12.0–15.0)
MCH: 30 pg (ref 26.0–34.0)
MCHC: 32.4 g/dL (ref 30.0–36.0)
MCV: 92.7 fL (ref 78.0–100.0)
Platelets: 523 10*3/uL — ABNORMAL HIGH (ref 150–400)
RBC: 4.36 MIL/uL (ref 3.87–5.11)
RDW: 16.6 % — ABNORMAL HIGH (ref 11.5–15.5)
WBC: 11.7 10*3/uL — ABNORMAL HIGH (ref 4.0–10.5)

## 2015-09-19 LAB — BASIC METABOLIC PANEL
Anion gap: 9 (ref 5–15)
BUN: 22 mg/dL — ABNORMAL HIGH (ref 6–20)
CO2: 27 mmol/L (ref 22–32)
Calcium: 9.2 mg/dL (ref 8.9–10.3)
Chloride: 100 mmol/L — ABNORMAL LOW (ref 101–111)
Creatinine, Ser: 1.08 mg/dL — ABNORMAL HIGH (ref 0.44–1.00)
GFR calc Af Amer: 58 mL/min — ABNORMAL LOW (ref >60)
GFR calc non Af Amer: 50 mL/min — ABNORMAL LOW (ref >60)
Glucose, Bld: 106 mg/dL — ABNORMAL HIGH (ref 65–99)
Potassium: 4.8 mmol/L (ref 3.5–5.1)
Sodium: 136 mmol/L (ref 135–145)

## 2015-09-19 LAB — HEPATIC FUNCTION PANEL
ALT: 17 U/L (ref 14–54)
AST: 16 U/L (ref 15–41)
Albumin: 3.8 g/dL (ref 3.5–5.0)
Alkaline Phosphatase: 70 U/L (ref 38–126)
Bilirubin, Direct: 0.1 mg/dL (ref 0.1–0.5)
Indirect Bilirubin: 0.8 mg/dL (ref 0.3–0.9)
Total Bilirubin: 0.9 mg/dL (ref 0.3–1.2)
Total Protein: 7.1 g/dL (ref 6.5–8.1)

## 2015-09-19 LAB — PROCALCITONIN: Procalcitonin: <0.1 ng/mL

## 2015-09-19 LAB — URINALYSIS, ROUTINE W REFLEX MICROSCOPIC
Bilirubin Urine: NEGATIVE
Glucose, UA: NEGATIVE mg/dL
Hgb urine dipstick: NEGATIVE
Ketones, ur: NEGATIVE mg/dL
Leukocytes, UA: NEGATIVE
Nitrite: NEGATIVE
Protein, ur: NEGATIVE mg/dL
Specific Gravity, Urine: 1.005 (ref 1.005–1.030)
Urobilinogen, UA: 1 mg/dL (ref 0.0–1.0)
pH: 7.5 (ref 5.0–8.0)

## 2015-09-19 LAB — I-STAT TROPONIN, ED: Troponin i, poc: 0.01 ng/mL (ref 0.00–0.08)

## 2015-09-19 LAB — LIPASE, BLOOD: Lipase: 42 U/L (ref 11–51)

## 2015-09-19 MED ORDER — BENAZEPRIL HCL 40 MG PO TABS
40.0000 mg | ORAL_TABLET | Freq: Every day | ORAL | Status: DC
  Administered 2015-09-20 – 2015-09-21 (×2): 40 mg via ORAL
  Filled 2015-09-19 (×2): qty 1

## 2015-09-19 MED ORDER — DEXTROMETHORPHAN POLISTIREX ER 30 MG/5ML PO SUER
30.0000 mg | Freq: Once | ORAL | Status: AC
  Administered 2015-09-19: 30 mg via ORAL
  Filled 2015-09-19: qty 5

## 2015-09-19 MED ORDER — HYDROCOD POLST-CPM POLST ER 10-8 MG/5ML PO SUER
5.0000 mL | Freq: Two times a day (BID) | ORAL | Status: DC | PRN
  Administered 2015-09-19: 5 mL via ORAL
  Filled 2015-09-19: qty 5

## 2015-09-19 MED ORDER — SODIUM CHLORIDE 0.9 % IV BOLUS (SEPSIS)
500.0000 mL | Freq: Once | INTRAVENOUS | Status: AC
  Administered 2015-09-19: 500 mL via INTRAVENOUS

## 2015-09-19 MED ORDER — ATORVASTATIN CALCIUM 40 MG PO TABS
40.0000 mg | ORAL_TABLET | Freq: Every day | ORAL | Status: DC
  Administered 2015-09-19 – 2015-09-21 (×3): 40 mg via ORAL
  Filled 2015-09-19 (×3): qty 1

## 2015-09-19 MED ORDER — ONDANSETRON HCL 4 MG/2ML IJ SOLN: 4.0000 mg | Freq: Four times a day (QID) | INTRAMUSCULAR | Status: DC | PRN

## 2015-09-19 MED ORDER — MOMETASONE FURO-FORMOTEROL FUM 200-5 MCG/ACT IN AERO
2.0000 | INHALATION_SPRAY | Freq: Two times a day (BID) | RESPIRATORY_TRACT | Status: DC
  Administered 2015-09-19 – 2015-09-21 (×3): 2 via RESPIRATORY_TRACT
  Filled 2015-09-19: qty 8.8

## 2015-09-19 MED ORDER — IOHEXOL 350 MG/ML SOLN
100.0000 mL | Freq: Once | INTRAVENOUS | Status: AC | PRN
  Administered 2015-09-19: 80 mL via INTRAVENOUS

## 2015-09-19 MED ORDER — ASPIRIN EC 81 MG PO TBEC
81.0000 mg | DELAYED_RELEASE_TABLET | Freq: Every day | ORAL | Status: DC
  Administered 2015-09-20 – 2015-09-21 (×2): 81 mg via ORAL
  Filled 2015-09-19 (×2): qty 1

## 2015-09-19 MED ORDER — ONDANSETRON HCL 4 MG/2ML IJ SOLN: 4.0000 mg | Freq: Once | INTRAMUSCULAR | Status: DC

## 2015-09-19 MED ORDER — HYDROCODONE-ACETAMINOPHEN 5-325 MG PO TABS
1.0000 | ORAL_TABLET | Freq: Four times a day (QID) | ORAL | Status: DC | PRN
  Administered 2015-09-19 – 2015-09-21 (×7): 1 via ORAL
  Filled 2015-09-19 (×7): qty 1

## 2015-09-19 MED ORDER — IPRATROPIUM-ALBUTEROL 0.5-2.5 (3) MG/3ML IN SOLN: 3.0000 mL | RESPIRATORY_TRACT | Status: DC | PRN

## 2015-09-19 MED ORDER — MORPHINE SULFATE (PF) 4 MG/ML IV SOLN
4.0000 mg | Freq: Once | INTRAVENOUS | Status: DC
  Filled 2015-09-19: qty 1

## 2015-09-19 MED ORDER — ONDANSETRON HCL 4 MG PO TABS: 4.0000 mg | ORAL_TABLET | Freq: Four times a day (QID) | ORAL | Status: DC | PRN

## 2015-09-19 MED ORDER — ALBUTEROL SULFATE (2.5 MG/3ML) 0.083% IN NEBU
3.0000 mL | INHALATION_SOLUTION | RESPIRATORY_TRACT | Status: DC | PRN
Start: 2015-09-19 — End: 2015-09-21

## 2015-09-19 MED ORDER — TRAMADOL HCL 50 MG PO TABS
50.0000 mg | ORAL_TABLET | Freq: Once | ORAL | Status: AC
  Administered 2015-09-19: 50 mg via ORAL
  Filled 2015-09-19: qty 1

## 2015-09-19 MED ORDER — ACETAMINOPHEN 500 MG PO TABS: 1000.0000 mg | ORAL_TABLET | Freq: Four times a day (QID) | ORAL | Status: DC | PRN

## 2015-09-19 MED ORDER — POLYVINYL ALCOHOL 1.4 % OP SOLN
2.0000 [drp] | Freq: Every day | OPHTHALMIC | Status: DC | PRN
  Administered 2015-09-19: 2 [drp] via OPHTHALMIC
  Filled 2015-09-19: qty 15

## 2015-09-19 MED ORDER — FLUTICASONE PROPIONATE 50 MCG/ACT NA SUSP
2.0000 | Freq: Every day | NASAL | Status: DC
  Administered 2015-09-19 – 2015-09-21 (×3): 2 via NASAL
  Filled 2015-09-19: qty 16

## 2015-09-19 MED ORDER — HYDROMORPHONE HCL 1 MG/ML IJ SOLN: 1.0000 mg | INTRAMUSCULAR | Status: DC | PRN

## 2015-09-19 MED ORDER — OMEGA-3-ACID ETHYL ESTERS 1 G PO CAPS
1.0000 g | ORAL_CAPSULE | Freq: Every day | ORAL | Status: DC
  Administered 2015-09-20 – 2015-09-21 (×2): 1 g via ORAL
  Filled 2015-09-19 (×2): qty 1

## 2015-09-19 NOTE — Discharge Summary (Deleted)
[facility] History and Physical  Tammy Ball HUT:[id number] DOB: Jul 13, 1941 DOA: 09/19/2015  Referring physician: EDP PCP: [name], MD   Chief Complaint: chest pain  HPI: Tammy Ball is a 74 y.o. female  with past medical history of COPD, former smoker, hypertension,  history of renal cell carcinoma status post left nephrectomy,, was just discharged from [facility] hospital on 11/1 after treatment for sepsis secondary to community-acquired pneumonia. She was sent home on Augmentin, prednisone as well as home O2 at 2 L. She reports feeling well and continuing to improve since discharge, completed her antibiotic course. Denied having any fevers, and her breathing continued to improve, yesterday afternoon started experiencing pain in the right side of her chest, pleuritic, radiating along the right chest wall. The pain worsened yesterday evening, due to persistence of this she presented to the emergency room today. In the ER underwent a CTA of her chest which was notable for an irregular opacity in the medial right upper lobe extending to and likely arising from the right hilum concerning for neoplasia with surrounding pneumonitis.  Review of Systems: Positives bolded Constitutional:  No weight loss, night sweats, Fevers, chills, fatigue.  HEENT:  No headaches, Difficulty swallowing,Tooth/dental problems,Sore throat,  No sneezing, itching, ear ache, nasal congestion, post nasal drip,  Cardio-vascular:   chest pain, Orthopnea, PND, swelling in lower extremities, anasarca, dizziness, palpitations  GI:  No heartburn, indigestion, abdominal pain, nausea, vomiting, diarrhea, change in bowel habits, loss of appetite  Resp:  No shortness of breath with exertion or at rest. No excess mucus, no productive cough, No non-productive cough, No coughing up of blood.No change in color of mucus.No wheezing.No chest wall deformity  Skin:  no rash or lesions.  GU:  no dysuria, change  in color of urine, no urgency or frequency. No flank pain.  Musculoskeletal:  No joint pain or swelling. No decreased range of motion. No back pain.  Psych:  No change in mood or affect. No depression or anxiety. No memory loss.   Past Medical History  Diagnosis Date  . COPD (chronic obstructive pulmonary disease) ([place])   . History of kidney cancer   . Hypertension   . Depression   . Asthma   . Glaucoma   . Hyperlipidemia   . Heart murmur   . Cancer [facility])    Past Surgical History  Procedure Laterality Date  . Nephrectomy    . Kidney cancer    . Tubal ligation    . Spine surgery    . Eye surgery     Social History:  reports that she quit smoking about 4 years ago. Her smoking use included Cigarettes. She has never used smokeless tobacco. She reports that she does not drink alcohol or use illicit drugs.  Allergies  Allergen Reactions  . Bee Venom Anaphylaxis, Shortness Of Breath and Swelling    Swelling at site   . Levofloxacin Other (See Comments)    Joint pain   . Amlodipine Swelling    Swelling of the ankles and hands   . Hctz [Hydrochlorothiazide] Palpitations and Other (See Comments)    Sweating     Family History  Problem Relation Age of Onset  . Heart disease Sister   . Obesity Brother   . Heart disease Daughter   . Glaucoma Daughter   . Breast cancer Sister   . Birth defects Sister   . Cancer Mother     Bladder Cancer  . Hypertension Mother   .  Heart disease Mother   . Cancer Maternal Grandmother     Prior to Admission medications   Medication Sig Start Date End Date Taking? Authorizing Provider  acetaminophen (TYLENOL) 500 MG tablet Take 1,000 mg by mouth every 6 (six) hours as needed for mild pain, moderate pain, fever or headache.   Yes Historical Provider, MD  albuterol (PROVENTIL HFA;VENTOLIN HFA) 108 (90 BASE) MCG/ACT inhaler Inhale 2 puffs into the lungs every 4 (four) hours as needed for wheezing or shortness of breath. 05/18/15 09/03/16 Yes [name], MD  aspirin EC 81 MG tablet Take 81 mg by mouth daily.   Yes Historical Provider, MD  atorvastatin (LIPITOR) 40 MG tablet TAKE ONE TABLET BY MOUTH ONCE DAILY. Patient taking differently: Take 40 mg by mouth daily.  08/28/15  Yes Chelle Jeffery, PA-C  benazepril (LOTENSIN) 40 MG tablet Take 1 tablet (40 mg total) by mouth daily. 08/29/15  Yes [name], MD  CALCIUM PO Take 1 tablet by mouth daily.   Yes Historical Provider, MD  chlorpheniramine-HYDROcodone (TUSSIONEX PENNKINETIC ER) 10-8 MG/5ML SUER Take 5 mLs by mouth every 12 (twelve) hours as needed for cough. 09/13/15  Yes Hosie Poisson, MD  Cholecalciferol (VITAMIN D-3 PO) Take 1 tablet by mouth daily.   Yes Historical Provider, MD  fish oil-omega-3 fatty acids 1000 MG capsule Take 1 g by mouth daily.     Yes Historical Provider, MD  fluticasone (FLONASE) 50 MCG/ACT nasal spray Place 2 sprays into both nostrils daily. 09/13/15  Yes Hosie Poisson, MD  ipratropium-albuterol (DUONEB) 0.5-2.5 (3) MG/3ML SOLN Take 3 mLs by nebulization 3 (three) times daily. 09/13/15  Yes Hosie Poisson, MD  mometasone-formoterol (DULERA) 200-5 MCG/ACT AERO Inhale 2 puffs into the lungs 2 (two) times daily. 08/29/15  Yes [name], MD  polyvinyl alcohol-povidone (REFRESH) 1.4-0.6 % ophthalmic solution Place 2 drops into both eyes daily as needed (for dry eyes).   Yes Historical Provider, MD  amoxicillin-clavulanate (AUGMENTIN) 875-125 MG tablet Take 1 tablet by mouth 2 (two) times daily. Patient not taking: Reported on 09/19/2015 09/13/15   Hosie Poisson, MD  benzonatate (TESSALON) 200 MG capsule Take 1 capsule (200 mg total) by mouth 3 (three) times daily as needed for cough. Patient not taking: Reported on 09/19/2015 09/13/15   Hosie Poisson, MD  predniSONE (DELTASONE) 20 MG tablet Take 2 tablets (40 mg total) by mouth daily before breakfast. Patient not taking: Reported on 09/19/2015 09/13/15   Hosie Poisson, MD   Physical Exam: Filed Vitals:    09/19/15 0727 09/19/15 0756 09/19/15 0941 09/19/15 1223  BP:   119/67 151/70  Pulse:   85 91  Temp:  97.9 F (36.6 C)    TempSrc:  Oral    Resp:   22 18  SpO2: 96%  96% 95%    Wt Readings from Last 3 Encounters:  09/08/15 80.74 kg (178 lb)  07/30/15 83.065 kg (183 lb 2 oz)  03/21/15 86.183 kg (190 lb)    General:  Appears calm and comfortable, AAO 3, no distress Eyes: PERRL, normal lids, irises & conjunctiva ENT: grossly normal hearing, lips & tongue Neck: no LAD, masses or thyromegaly Cardiovascular: RRR, no m/r/g. No LE edema. Telemetry: SR, no arrhythmias  Respiratory: CTA bilaterally, no w/r/r. Normal respiratory effort. Abdomen: soft, ntnd Skin: no rash or induration seen on limited exam Musculoskeletal: grossly normal tone BUE/BLE Psychiatric: grossly normal mood and affect, speech fluent and appropriate Neurologic: grossly non-focal.  Labs on Admission:  Basic Metabolic Panel:  Recent Labs Lab 09/19/15 0755  NA 136  K 4.8  CL 100*  CO2 27  GLUCOSE 106*  BUN 22*  CREATININE 1.08*  CALCIUM 9.2   Liver Function Tests:  Recent Labs Lab 09/19/15 0755  AST 16  ALT 17  ALKPHOS 70  BILITOT 0.9  PROT 7.1  ALBUMIN 3.8    Recent Labs Lab 09/19/15 0755  LIPASE 42   No results for input(s): AMMONIA in the last 168 hours. CBC:  Recent Labs Lab 09/19/15 0755  WBC 11.7*  HGB 13.1  HCT 40.4  MCV 92.7  PLT 523*   Cardiac Enzymes: No results for input(s): CKTOTAL, CKMB, CKMBINDEX, TROPONINI in the last 168 hours.  BNP (last 3 results) No results for input(s): BNP in the last 8760 hours.  ProBNP (last 3 results) No results for input(s): PROBNP in the last 8760 hours.  CBG: No results for input(s): GLUCAP in the last 168 hours.  Radiological Exams on Admission: Dg Chest 2 View  09/19/2015  CLINICAL DATA:  Chest pain and shortness of breath EXAM: CHEST  2 VIEW COMPARISON:  September 12, 2015 and September 08, 2015 FINDINGS: There is  airspace consolidation in the right upper lobe medially, overlying the hilum on the lateral view. There is persistent underlying interstitial prominence. There is patchy atelectasis in the lung bases with small pleural effusions bilaterally. Heart size and pulmonary vascularity are normal. No adenopathy. No bone lesions are appreciable. There is postoperative change in the lower cervical spine. IMPRESSION: There is airspace consolidation in the right upper lobe medially. There is patchy bibasilar atelectasis with small pleural effusions. There is underlying interstitial prominence. There may well be a degree of underlying fibrotic type change. No change in cardiac silhouette. Followup PA and lateral chest radiographs recommended in 3-4 weeks following trial of antibiotic therapy to ensure resolution and exclude underlying malignancy. Electronically Signed   By: Lowella Grip III M.D.   On: 09/19/2015 07:58   Ct Angio Chest Pe W/cm &/or Wo Cm  09/19/2015  CLINICAL DATA:  Chest pain with difficulty breathing. History of renal cell carcinoma EXAM: CT ANGIOGRAPHY CHEST WITH CONTRAST TECHNIQUE: Multidetector CT imaging of the chest was performed using the standard protocol during bolus administration of intravenous contrast. Multiplanar CT image reconstructions and MIPs were obtained to evaluate the vascular anatomy. CONTRAST:  52m OMNIPAQUE IOHEXOL 350 MG/ML SOLN COMPARISON:  Chest radiograph September 19, 2015 FINDINGS: There is no demonstrable pulmonary embolus. There is no thoracic aortic aneurysm or dissection. There is atherosclerotic change in the aorta. There are scattered foci of atherosclerotic calcification in the visualized great vessels. : There is a small right pleural effusion. There is patchy airspace consolidation in both lung bases. In the right hilar region, there is irregular opacity with partial narrowing of the right upper lobe bronchus. This area of irregular opacity measures 4.5 x 3.6 cm.  Elsewhere, there is mild scarring in the apices with bullous disease in the right apex. Thyroid appears unremarkable. Beyond apparent adenopathy in the right hilar region, no other adenopathy is appreciable. There is borderline pericardial thickening with questionable small pericardial effusion. In the visualized upper abdomen, no lesions are appreciable beyond atherosclerotic calcification in the aorta. There are no blastic or lytic bone lesions. Review of the MIP images confirms the above findings. IMPRESSION: No demonstrable pulmonary embolus. Irregular opacity in the medial right upper lobe extending to and possibly arising from the right hilum. The appearance  in this area is concerning for neoplasia with surrounding pneumonitis. Note that there is narrowing of a portion of the right upper lobe bronchus without obstruction of this bronchus. There is a small right effusion with patchy airspace consolidation in both lung bases. There is no adenopathy beyond what is felt to represent lymph node prominence in the right hilum. Question small right pericardial effusion. Scattered areas of atherosclerotic change. With respect to the lesion in the right hilum/right upper lobe, bronchoscopy may be helpful for further evaluation. Alternatively, PET-CT may be helpful, particularly given history of previous renal cell carcinoma. Electronically Signed   By: Lowella Grip III M.D.   On: 09/19/2015 10:45    Assessment/Plan    Lung mass -Concerning for malignancy, former smoker greater than 50-pack-year history -Could be primary lung versus metastatic RCC -Pulmonary consulted for bronchoscopy and biopsy   Recent pneumonia -Likely postobstructive -Completed 10 day course of antibiotics, I do not think antibiotics are warranted at this time, leukocytosis could be in part secondary to steroid-induced as well, monitor clinically  COPD -now on 2L Home O2,  -Stable, nebs when necessary   Hypertension -Stable  continue benazepril    Renal cell cancer  -Status post left nephrectomy  Code Status: Full code DVT Prophylaxis: SCDs, needs Biopsy Family Communication: Son at bedside  Disposition Plan: Observation   Time spent: 60 minutes   [facility] Pager [phone]

## 2015-09-19 NOTE — ED Notes (Signed)
RN to start IV and draw labs at bedside

## 2015-09-19 NOTE — ED Notes (Signed)
MD at bedside. 

## 2015-09-19 NOTE — ED Notes (Signed)
Per patient, right flank pain since last night, no dysuria

## 2015-09-19 NOTE — Consult Note (Addendum)
Name: Tammy Ball MRN: [id number] DOB: [birth year]    ADMISSION DATE:  09/19/2015 CONSULTATION DATE:  11/7  REFERRING MD :  Broadus John   CHIEF COMPLAINT:  Lung Mass  BRIEF PATIENT DESCRIPTION:  74 year old female w/ PMH RCC and prior L nephrectomy. Just d/c'd 11/1 after being admitted for CAP. Re-admitted on 11/7 w/ cc: right flank/chest wall pain. CT chest was obtained showing RUL lung mass for which PCCM was consulted.    SIGNIFICANT EVENTS    STUDIES:  CT chest 11/7: Irregular opacity in the medial right upper lobe extending to and possibly arising from the right hilum. The appearance in this area is concerning for neoplasia with surrounding pneumonitis. Note that there is narrowing of a portion of the right upper lobe bronchus without obstruction of this bronchus. here is a small right effusion with patchy airspace consolidation in both lung bases.   HISTORY OF PRESENT ILLNESS:   74 year old female who presented to ED 11/7 w/ cc: right posterior chest-wall pain. Has PMH of RCC w/ prior left nephrectomy. Recently d/c'd on 11/1 for what was treated as CAP. Was re-admitted to further evaluate her chest pain. A CT abd/pelvis was obtained: this showed RUL lesion for which we were consulted.   PAST MEDICAL HISTORY :   has a past medical history of COPD (chronic obstructive pulmonary disease) ([place]); History of kidney cancer; Hypertension; Depression; Asthma; Glaucoma; Hyperlipidemia; Heart murmur; and Cancer ([place]).  has past surgical history that includes Nephrectomy; kidney cancer; Tubal ligation; Spine surgery; and Eye surgery. Prior to Admission medications   Medication Sig Start Date End Date Taking? Authorizing Provider  acetaminophen (TYLENOL) 500 MG tablet Take 1,000 mg by mouth every 6 (six) hours as needed for mild pain, moderate pain, fever or headache.   Yes Historical Provider, MD  albuterol (PROVENTIL HFA;VENTOLIN HFA) 108 (90 BASE) MCG/ACT inhaler Inhale 2 puffs into the  lungs every 4 (four) hours as needed for wheezing or shortness of breath. 05/18/15 09/03/16 Yes [name], MD  aspirin EC 81 MG tablet Take 81 mg by mouth daily.   Yes Historical Provider, MD  atorvastatin (LIPITOR) 40 MG tablet TAKE ONE TABLET BY MOUTH ONCE DAILY. Patient taking differently: Take 40 mg by mouth daily.  08/28/15  Yes Chelle Jeffery, PA-C  benazepril (LOTENSIN) 40 MG tablet Take 1 tablet (40 mg total) by mouth daily. 08/29/15  Yes [name], MD  CALCIUM PO Take 1 tablet by mouth daily.   Yes Historical Provider, MD  chlorpheniramine-HYDROcodone (TUSSIONEX PENNKINETIC ER) 10-8 MG/5ML SUER Take 5 mLs by mouth every 12 (twelve) hours as needed for cough. 09/13/15  Yes Hosie Poisson, MD  Cholecalciferol (VITAMIN D-3 PO) Take 1 tablet by mouth daily.   Yes Historical Provider, MD  fish oil-omega-3 fatty acids 1000 MG capsule Take 1 g by mouth daily.     Yes Historical Provider, MD  fluticasone (FLONASE) 50 MCG/ACT nasal spray Place 2 sprays into both nostrils daily. 09/13/15  Yes Hosie Poisson, MD  ipratropium-albuterol (DUONEB) 0.5-2.5 (3) MG/3ML SOLN Take 3 mLs by nebulization 3 (three) times daily. 09/13/15  Yes Hosie Poisson, MD  mometasone-formoterol (DULERA) 200-5 MCG/ACT AERO Inhale 2 puffs into the lungs 2 (two) times daily. 08/29/15  Yes [name], MD  polyvinyl alcohol-povidone (REFRESH) 1.4-0.6 % ophthalmic solution Place 2 drops into both eyes daily as needed (for dry eyes).   Yes Historical Provider, MD  amoxicillin-clavulanate (AUGMENTIN) 875-125 MG tablet Take 1 tablet by mouth 2 (  two) times daily. Patient not taking: Reported on 09/19/2015 09/13/15   Hosie Poisson, MD  benzonatate (TESSALON) 200 MG capsule Take 1 capsule (200 mg total) by mouth 3 (three) times daily as needed for cough. Patient not taking: Reported on 09/19/2015 09/13/15   Hosie Poisson, MD  predniSONE (DELTASONE) 20 MG tablet Take 2 tablets (40 mg total) by mouth daily before breakfast. Patient not  taking: Reported on 09/19/2015 09/13/15   Hosie Poisson, MD   Allergies  Allergen Reactions  . Bee Venom Anaphylaxis, Shortness Of Breath and Swelling    Swelling at site   . Levofloxacin Other (See Comments)    Joint pain   . Amlodipine Swelling    Swelling of the ankles and hands   . Hctz [Hydrochlorothiazide] Palpitations and Other (See Comments)    Sweating     FAMILY HISTORY:  family history includes Birth defects in her sister; Breast cancer in her sister; Cancer in her maternal grandmother and mother; Glaucoma in her daughter; Heart disease in her daughter, mother, and sister; Hypertension in her mother; Obesity in her brother. SOCIAL HISTORY:  reports that she quit smoking about 4 years ago. Her smoking use included Cigarettes. She has never used smokeless tobacco. She reports that she does not drink alcohol or use illicit drugs.  REVIEW OF SYSTEMS:   Constitutional: Negative for fever, chills, weight loss, +fatigue and diaphoresis.  HENT: Negative for hearing loss, ear pain, nosebleeds, congestion, sore throat, neck pain, tinnitus and ear discharge.   Eyes: Negative for blurred vision, double vision, photophobia, pain, discharge and redness.  Respiratory: Negative for cough, hemoptysis, sputum production, shortness of breath, wheezing and stridor.  + right sided Chest wall pain.  Cardiovascular: Negative for chest pain, palpitations, orthopnea, claudication, leg swelling and PND.  Gastrointestinal: Negative for heartburn, nausea, vomiting, abdominal pain, diarrhea, constipation, blood in stool and melena.  Genitourinary: Negative for dysuria, urgency, frequency, hematuria and flank pain.  Musculoskeletal: Negative for myalgias, back pain, joint pain and falls.  Skin: Negative for itching and rash.  Neurological: Negative for dizziness, tingling, tremors, sensory change, speech change, focal weakness, seizures, loss of consciousness, weakness and headaches.  Endo/Heme/Allergies:  Negative for environmental allergies and polydipsia. Does not bruise/bleed easily.  SUBJECTIVE:   VITAL SIGNS: Temp:  [97.9 F (36.6 C)-98.4 F (36.9 C)] 98.4 F (36.9 C) (11/07 1355) Pulse Rate:  [78-94] 94 (11/07 1355) Resp:  [18-24] 20 (11/07 1355) BP: (119-158)/(59-76) 141/65 mmHg (11/07 1355) SpO2:  [94 %-96 %] 95 % (11/07 1355)  PHYSICAL EXAMINATION: General:  Elderly appearing female, NAD Neuro:  Alert and oriented, moving all ext to command. HEENT:  [place]/AT, PERRL, EOM-I and MMM. Cardiovascular:  RRR, NL S1/S2, -M/R/G. Lungs:  CTA bilaterally, pain on deep inhalation however. Abdomen:  Soft, NT, ND and +BS. Musculoskeletal:  -edema and -tenderness. Skin:  Intact.   Recent Labs Lab 09/19/15 0755  NA 136  K 4.8  CL 100*  CO2 27  BUN 22*  CREATININE 1.08*  GLUCOSE 106*   Recent Labs Lab 09/19/15 0755  HGB 13.1  HCT 40.4  WBC 11.7*  PLT 523*   Dg Chest 2 View  09/19/2015  CLINICAL DATA:  Chest pain and shortness of breath EXAM: CHEST  2 VIEW COMPARISON:  September 12, 2015 and September 08, 2015 FINDINGS: There is airspace consolidation in the right upper lobe medially, overlying the hilum on the lateral view. There is persistent underlying interstitial prominence. There is patchy atelectasis in the lung bases with  small pleural effusions bilaterally. Heart size and pulmonary vascularity are normal. No adenopathy. No bone lesions are appreciable. There is postoperative change in the lower cervical spine. IMPRESSION: There is airspace consolidation in the right upper lobe medially. There is patchy bibasilar atelectasis with small pleural effusions. There is underlying interstitial prominence. There may well be a degree of underlying fibrotic type change. No change in cardiac silhouette. Followup PA and lateral chest radiographs recommended in 3-4 weeks following trial of antibiotic therapy to ensure resolution and exclude underlying malignancy. Electronically Signed   By:  Lowella Grip III M.D.   On: 09/19/2015 07:58   Ct Angio Chest Pe W/cm &/or Wo Cm  09/19/2015  CLINICAL DATA:  Chest pain with difficulty breathing. History of renal cell carcinoma EXAM: CT ANGIOGRAPHY CHEST WITH CONTRAST TECHNIQUE: Multidetector CT imaging of the chest was performed using the standard protocol during bolus administration of intravenous contrast. Multiplanar CT image reconstructions and MIPs were obtained to evaluate the vascular anatomy. CONTRAST:  19m OMNIPAQUE IOHEXOL 350 MG/ML SOLN COMPARISON:  Chest radiograph September 19, 2015 FINDINGS: There is no demonstrable pulmonary embolus. There is no thoracic aortic aneurysm or dissection. There is atherosclerotic change in the aorta. There are scattered foci of atherosclerotic calcification in the visualized great vessels. : There is a small right pleural effusion. There is patchy airspace consolidation in both lung bases. In the right hilar region, there is irregular opacity with partial narrowing of the right upper lobe bronchus. This area of irregular opacity measures 4.5 x 3.6 cm. Elsewhere, there is mild scarring in the apices with bullous disease in the right apex. Thyroid appears unremarkable. Beyond apparent adenopathy in the right hilar region, no other adenopathy is appreciable. There is borderline pericardial thickening with questionable small pericardial effusion. In the visualized upper abdomen, no lesions are appreciable beyond atherosclerotic calcification in the aorta. There are no blastic or lytic bone lesions. Review of the MIP images confirms the above findings. IMPRESSION: No demonstrable pulmonary embolus. Irregular opacity in the medial right upper lobe extending to and possibly arising from the right hilum. The appearance in this area is concerning for neoplasia with surrounding pneumonitis. Note that there is narrowing of a portion of the right upper lobe bronchus without obstruction of this bronchus. There is a small  right effusion with patchy airspace consolidation in both lung bases. There is no adenopathy beyond what is felt to represent lymph node prominence in the right hilum. Question small right pericardial effusion. Scattered areas of atherosclerotic change. With respect to the lesion in the right hilum/right upper lobe, bronchoscopy may be helpful for further evaluation. Alternatively, PET-CT may be helpful, particularly given history of previous renal cell carcinoma. Electronically Signed   By: WLowella GripIII M.D.   On: 09/19/2015 10:45    ASSESSMENT / PLAN: Right sided pleuritic chest pain Small right effusion Recent CAP RUL Lung mass  Plan Will need FOB given h/o RCC Would treat pleurisy w/ NSAIDs Ck PCT.   PErick ColaceACNP-BC LLafePager # [phone] # [phone] no answer  Attending Note:  74year old female with history of renal cell carcinoma s/p left sided nephrectomy who presents to the hospital back after a treatment of PNA inpatient now with pleuritic chest pain.  RCC was in 04/2011 with yearly follow ups completely normal.  Cough productive only of clear sputum.  Lungs clear on exam.  CTA was done that showed a right sided lung mass.  I reviewed chest CT right sided lung mass and pleural effusions noted.  Case discussed with PCCM-NP and Dr. Ashok Cordia.  Right sided pleuritic chest pain: Likely pleurisy following a pneumonia.  - NSAIDs as needed.  - Abx for HCAP if fever or worsening infiltrate.   Small right effusion: ?related to infection.  - Abx.  - No thora for now.  - Repeat imaging to see if fluid increases then will need a thora.   Recent CAP  - Monitor closely of abx.  - Check PCT, if elevated will need abx.  - CXR.  RUL Lung mass: Concern for a new primary of lung cancer vs metastatic recurrence.  - Schedule for bronch in AM at 9 AM.  - Check coags.  COPD: on dulera at home and 2L [place].  - Continue dulera.  - Bronchodilators as  ordered.  - Titrate O2 for sat of 88-92%.  Dr. Elsworth Soho will see early 11/8 and will decide if bronch is necessary, will schedule for 9 AM 11/8 for now.  Patient seen and examined, agree with above note.  I dictated the care and orders written for this patient under my direction.  Rush Farmer, MD [phone]  09/19/2015, 3:06 PM

## 2015-09-19 NOTE — ED Notes (Signed)
Patient transported to X-ray 

## 2015-09-19 NOTE — H&P (Signed)
[facility] History and Physical  Tammy Ball DQQ:[id number] DOB: 1941-04-01 DOA: 09/19/2015   Referring physician: EDP PCP: [name], MD   Chief Complaint: chest pain  HPI: Tammy Ball is a 74 y.o. female with past medical history of COPD, former smoker, hypertension, history of renal cell carcinoma status post left nephrectomy,, was just discharged from [facility] long hospital on 11/1 after treatment for sepsis secondary to community-acquired pneumonia. She was sent home on Augmentin, prednisone as well as home O2 at 2 L. She reports feeling well and continuing to improve since discharge, completed her antibiotic course. Denied having any fevers, and her breathing continued to improve, yesterday afternoon started experiencing pain in the right side of her chest, pleuritic, radiating along the right chest wall. The pain worsened yesterday evening, due to persistence of this she presented to the emergency room today. In the ER underwent a CTA of her chest which was notable for an irregular opacity in the medial right upper lobe extending to and likely arising from the right hilum concerning for neoplasia with surrounding pneumonitis.  Review of Systems: Positives bolded Constitutional:  No weight loss, night sweats, Fevers, chills, fatigue.  HEENT:  No headaches, Difficulty swallowing,Tooth/dental problems,Sore throat,  No sneezing, itching, ear ache, nasal congestion, post nasal drip,  Cardio-vascular:  chest pain, Orthopnea, PND, swelling in lower extremities, anasarca, dizziness, palpitations  GI:  No heartburn, indigestion, abdominal pain, nausea, vomiting, diarrhea, change in bowel habits, loss of appetite  Resp:  No shortness of breath with exertion or at rest. No excess mucus, no productive cough, No non-productive cough, No coughing up of blood.No change in color of mucus.No wheezing.No chest wall deformity  Skin:  no rash or lesions.  GU:  no  dysuria, change in color of urine, no urgency or frequency. No flank pain.  Musculoskeletal:  No joint pain or swelling. No decreased range of motion. No back pain.  Psych:  No change in mood or affect. No depression or anxiety. No memory loss.     Past Medical History  Diagnosis Date  . COPD (chronic obstructive pulmonary disease) ([place])   . History of kidney cancer   . Hypertension   . Depression   . Asthma   . Glaucoma   . Hyperlipidemia   . Heart murmur   . Cancer [facility])    Past Surgical History  Procedure Laterality Date  . Nephrectomy    . Kidney cancer    . Tubal ligation    . Spine surgery    . Eye surgery     Social History:  reports that she quit smoking about 4 years ago. Her smoking use included Cigarettes. She has never used smokeless tobacco. She reports that she does not drink alcohol or use illicit drugs.  Allergies  Allergen Reactions  . Bee Venom Anaphylaxis, Shortness Of Breath and Swelling    Swelling at site   . Levofloxacin Other (See Comments)    Joint pain   . Amlodipine Swelling    Swelling of the ankles and hands   . Hctz [Hydrochlorothiazide] Palpitations and Other (See Comments)    Sweating     Family History  Problem Relation Age of Onset  . Heart disease Sister   . Obesity Brother   . Heart disease Daughter   . Glaucoma Daughter   . Breast cancer Sister   . Birth defects Sister   . Cancer Mother     Bladder Cancer  . Hypertension Mother   .  Heart disease Mother   . Cancer Maternal Grandmother     Prior to Admission medications   Medication Sig Start Date End Date Taking? Authorizing Provider  acetaminophen (TYLENOL) 500 MG tablet Take 1,000 mg by mouth every 6 (six) hours as needed for mild pain, moderate pain, fever or headache.   Yes Historical Provider, MD  albuterol (PROVENTIL HFA;VENTOLIN HFA) 108 (90 BASE) MCG/ACT inhaler Inhale 2 puffs into the lungs every 4 (four) hours as needed for wheezing or shortness of breath.  05/18/15 09/03/16 Yes [name], MD  aspirin EC 81 MG tablet Take 81 mg by mouth daily.   Yes Historical Provider, MD  atorvastatin (LIPITOR) 40 MG tablet TAKE ONE TABLET BY MOUTH ONCE DAILY. Patient taking differently: Take 40 mg by mouth daily.  08/28/15  Yes Chelle Jeffery, PA-C  benazepril (LOTENSIN) 40 MG tablet Take 1 tablet (40 mg total) by mouth daily. 08/29/15  Yes [name], MD  CALCIUM PO Take 1 tablet by mouth daily.   Yes Historical Provider, MD  chlorpheniramine-HYDROcodone (TUSSIONEX PENNKINETIC ER) 10-8 MG/5ML SUER Take 5 mLs by mouth every 12 (twelve) hours as needed for cough. 09/13/15  Yes Hosie Poisson, MD  Cholecalciferol (VITAMIN D-3 PO) Take 1 tablet by mouth daily.   Yes Historical Provider, MD  fish oil-omega-3 fatty acids 1000 MG capsule Take 1 g by mouth daily.     Yes Historical Provider, MD  fluticasone (FLONASE) 50 MCG/ACT nasal spray Place 2 sprays into both nostrils daily. 09/13/15  Yes Hosie Poisson, MD  ipratropium-albuterol (DUONEB) 0.5-2.5 (3) MG/3ML SOLN Take 3 mLs by nebulization 3 (three) times daily. 09/13/15  Yes Hosie Poisson, MD  mometasone-formoterol (DULERA) 200-5 MCG/ACT AERO Inhale 2 puffs into the lungs 2 (two) times daily. 08/29/15  Yes [name], MD  polyvinyl alcohol-povidone (REFRESH) 1.4-0.6 % ophthalmic solution Place 2 drops into both eyes daily as needed (for dry eyes).   Yes Historical Provider, MD  amoxicillin-clavulanate (AUGMENTIN) 875-125 MG tablet Take 1 tablet by mouth 2 (two) times daily. Patient not taking: Reported on 09/19/2015 09/13/15   Hosie Poisson, MD  benzonatate (TESSALON) 200 MG capsule Take 1 capsule (200 mg total) by mouth 3 (three) times daily as needed for cough. Patient not taking: Reported on 09/19/2015 09/13/15   Hosie Poisson, MD  predniSONE (DELTASONE) 20 MG tablet Take 2 tablets (40 mg total) by mouth daily before breakfast. Patient not taking: Reported on 09/19/2015 09/13/15   Hosie Poisson, MD   Physical  Exam: Filed Vitals:   09/19/15 0756 09/19/15 0941 09/19/15 1215 09/19/15 1223  BP:  119/67  151/70  Pulse:  85 88 91  Temp: 97.9 F (36.6 C)     TempSrc: Oral     Resp:  '22 24 18  '$ SpO2:  96% 96% 95%    Wt Readings from Last 3 Encounters:  09/08/15 80.74 kg (178 lb)  07/30/15 83.065 kg (183 lb 2 oz)  03/21/15 86.183 kg (190 lb)     General: Appears calm and comfortable, AAO 3, no distress  Eyes: PERRL, normal lids, irises & conjunctiva  ENT: grossly normal hearing, lips & tongue  Neck: no LAD, masses or thyromegaly  Cardiovascular: RRR, no m/r/g. No LE edema.  Telemetry: SR, no arrhythmias   Respiratory: CTA bilaterally, no w/r/r. Normal respiratory effort.  Abdomen: soft, ntnd  Skin: no rash or induration seen on limited exam  Musculoskeletal: grossly normal tone BUE/BLE  Psychiatric: grossly normal mood and affect, speech fluent and appropriate  Neurologic:  grossly non-focal.                     Labs on Admission:  Basic Metabolic Panel:  Recent Labs Lab 09/19/15 0755  NA 136  K 4.8  CL 100*  CO2 27  GLUCOSE 106*  BUN 22*  CREATININE 1.08*  CALCIUM 9.2   Liver Function Tests:  Recent Labs Lab 09/19/15 0755  AST 16  ALT 17  ALKPHOS 70  BILITOT 0.9  PROT 7.1  ALBUMIN 3.8    Recent Labs Lab 09/19/15 0755  LIPASE 42   No results for input(s): AMMONIA in the last 168 hours. CBC:  Recent Labs Lab 09/19/15 0755  WBC 11.7*  HGB 13.1  HCT 40.4  MCV 92.7  PLT 523*   Cardiac Enzymes: No results for input(s): CKTOTAL, CKMB, CKMBINDEX, TROPONINI in the last 168 hours.  BNP (last 3 results) No results for input(s): BNP in the last 8760 hours.  ProBNP (last 3 results) No results for input(s): PROBNP in the last 8760 hours.  CBG: No results for input(s): GLUCAP in the last 168 hours.  Radiological Exams on Admission: Dg Chest 2 View  09/19/2015  CLINICAL DATA:  Chest pain and shortness of breath EXAM: CHEST  2 VIEW  COMPARISON:  September 12, 2015 and September 08, 2015 FINDINGS: There is airspace consolidation in the right upper lobe medially, overlying the hilum on the lateral view. There is persistent underlying interstitial prominence. There is patchy atelectasis in the lung bases with small pleural effusions bilaterally. Heart size and pulmonary vascularity are normal. No adenopathy. No bone lesions are appreciable. There is postoperative change in the lower cervical spine. IMPRESSION: There is airspace consolidation in the right upper lobe medially. There is patchy bibasilar atelectasis with small pleural effusions. There is underlying interstitial prominence. There may well be a degree of underlying fibrotic type change. No change in cardiac silhouette. Followup PA and lateral chest radiographs recommended in 3-4 weeks following trial of antibiotic therapy to ensure resolution and exclude underlying malignancy. Electronically Signed   By: Lowella Grip III M.D.   On: 09/19/2015 07:58   Ct Angio Chest Pe W/cm &/or Wo Cm  09/19/2015  CLINICAL DATA:  Chest pain with difficulty breathing. History of renal cell carcinoma EXAM: CT ANGIOGRAPHY CHEST WITH CONTRAST TECHNIQUE: Multidetector CT imaging of the chest was performed using the standard protocol during bolus administration of intravenous contrast. Multiplanar CT image reconstructions and MIPs were obtained to evaluate the vascular anatomy. CONTRAST:  45m OMNIPAQUE IOHEXOL 350 MG/ML SOLN COMPARISON:  Chest radiograph September 19, 2015 FINDINGS: There is no demonstrable pulmonary embolus. There is no thoracic aortic aneurysm or dissection. There is atherosclerotic change in the aorta. There are scattered foci of atherosclerotic calcification in the visualized great vessels. : There is a small right pleural effusion. There is patchy airspace consolidation in both lung bases. In the right hilar region, there is irregular opacity with partial narrowing of the right upper  lobe bronchus. This area of irregular opacity measures 4.5 x 3.6 cm. Elsewhere, there is mild scarring in the apices with bullous disease in the right apex. Thyroid appears unremarkable. Beyond apparent adenopathy in the right hilar region, no other adenopathy is appreciable. There is borderline pericardial thickening with questionable small pericardial effusion. In the visualized upper abdomen, no lesions are appreciable beyond atherosclerotic calcification in the aorta. There are no blastic or lytic bone lesions. Review of the MIP images confirms the above findings. IMPRESSION: No  demonstrable pulmonary embolus. Irregular opacity in the medial right upper lobe extending to and possibly arising from the right hilum. The appearance in this area is concerning for neoplasia with surrounding pneumonitis. Note that there is narrowing of a portion of the right upper lobe bronchus without obstruction of this bronchus. There is a small right effusion with patchy airspace consolidation in both lung bases. There is no adenopathy beyond what is felt to represent lymph node prominence in the right hilum. Question small right pericardial effusion. Scattered areas of atherosclerotic change. With respect to the lesion in the right hilum/right upper lobe, bronchoscopy may be helpful for further evaluation. Alternatively, PET-CT may be helpful, particularly given history of previous renal cell carcinoma. Electronically Signed   By: Lowella Grip III M.D.   On: 09/19/2015 10:45    Assessment/Plan   Lung mass -Concerning for malignancy, former smoker greater than 50-pack-year history -Could be primary lung versus metastatic RCC -Pulmonary consulted for bronchoscopy and biopsy  Recent pneumonia -Likely postobstructive -Completed 10 day course of antibiotics, I do not think antibiotics are warranted at this time, leukocytosis could be in part secondary to steroid-induced as well, monitor clinically  COPD -now on 2L  Home O2,  -Stable, nebs when necessary  Hypertension -Stable continue benazepril   Renal cell cancer  -Status post left nephrectomy  Code Status: Full code DVT Prophylaxis: SCDs, needs Biopsy Family Communication: Son at bedside  Disposition Plan: Observation   Time spent: 60 minutes   [facility] Pager [phone]

## 2015-09-19 NOTE — ED Provider Notes (Signed)
CSN: [id number]     Arrival date & time 09/19/15  2376 History   First MD Initiated Contact with Patient 09/19/15 [phone]     Chief Complaint  Patient presents with  . Flank Pain     (Consider location/radiation/quality/duration/timing/severity/associated sxs/prior Treatment) HPI 74 year old female who presents with right flank pain. History of COPD, HTN, HLD, RCC s/p left nephrectomy. Recently discharged from hospital on 09/13/15 for severe sepsis 2/2 CAP. Discharged on 2L supplemental home oxygen. States that she has been recovering well from her pneumonia. Finish her antibiotics a few days ago. Has not had any recurrent fevers, difficulty breathing, productive cough. States that she has had intermittent right sided lower chest wall pain that is sharp in nature and worse with movement, pushing on the right side of her chest wall, and taking a deep breath. States that she had this while she was in the hospital with severe coughing, the pain had intermittently improved. Noted that pain began to get worse again yesterday evening. denies abdominal pain, nausea, vomiting, diarrhea, melena, hematochezia, hematuria, dysuria, or urinary frequency.   Past Medical History  Diagnosis Date  . COPD (chronic obstructive pulmonary disease) ([place])   . History of kidney cancer   . Hypertension   . Depression   . Asthma   . Glaucoma   . Hyperlipidemia   . Heart murmur   . Cancer [facility])    Past Surgical History  Procedure Laterality Date  . Nephrectomy    . Kidney cancer    . Tubal ligation    . Spine surgery    . Eye surgery     Family History  Problem Relation Age of Onset  . Heart disease Sister   . Obesity Brother   . Heart disease Daughter   . Glaucoma Daughter   . Breast cancer Sister   . Birth defects Sister   . Cancer Mother     Bladder Cancer  . Hypertension Mother   . Heart disease Mother   . Cancer Maternal Grandmother    Social History  Substance Use Topics  . Smoking status:  Former Smoker    Types: Cigarettes    Quit date: 03/16/2011  . Smokeless tobacco: Never Used  . Alcohol Use: No   OB History    No data available     Review of Systems  10/14 systems reviewed and are negative other than those stated in the HPI   Allergies  Bee venom; Levofloxacin; Amlodipine; and Hctz  Home Medications   Prior to Admission medications   Medication Sig Start Date End Date Taking? Authorizing Provider  acetaminophen (TYLENOL) 500 MG tablet Take 1,000 mg by mouth every 6 (six) hours as needed for mild pain, moderate pain, fever or headache.   Yes Historical Provider, MD  albuterol (PROVENTIL HFA;VENTOLIN HFA) 108 (90 BASE) MCG/ACT inhaler Inhale 2 puffs into the lungs every 4 (four) hours as needed for wheezing or shortness of breath. 05/18/15 09/03/16 Yes [name], MD  aspirin EC 81 MG tablet Take 81 mg by mouth daily.   Yes Historical Provider, MD  atorvastatin (LIPITOR) 40 MG tablet TAKE ONE TABLET BY MOUTH ONCE DAILY. Patient taking differently: Take 40 mg by mouth daily.  08/28/15  Yes Chelle Jeffery, PA-C  benazepril (LOTENSIN) 40 MG tablet Take 1 tablet (40 mg total) by mouth daily. 08/29/15  Yes [name], MD  CALCIUM PO Take 1 tablet by mouth daily.   Yes Historical Provider, MD  chlorpheniramine-HYDROcodone [facility]  ER) 10-8 MG/5ML SUER Take 5 mLs by mouth every 12 (twelve) hours as needed for cough. 09/13/15  Yes Hosie Poisson, MD  Cholecalciferol (VITAMIN D-3 PO) Take 1 tablet by mouth daily.   Yes Historical Provider, MD  fish oil-omega-3 fatty acids 1000 MG capsule Take 1 g by mouth daily.     Yes Historical Provider, MD  fluticasone (FLONASE) 50 MCG/ACT nasal spray Place 2 sprays into both nostrils daily. 09/13/15  Yes Hosie Poisson, MD  ipratropium-albuterol (DUONEB) 0.5-2.5 (3) MG/3ML SOLN Take 3 mLs by nebulization 3 (three) times daily. 09/13/15  Yes Hosie Poisson, MD  mometasone-formoterol (DULERA) 200-5 MCG/ACT AERO Inhale 2 puffs  into the lungs 2 (two) times daily. 08/29/15  Yes [name], MD  polyvinyl alcohol-povidone (REFRESH) 1.4-0.6 % ophthalmic solution Place 2 drops into both eyes daily as needed (for dry eyes).   Yes Historical Provider, MD  amoxicillin-clavulanate (AUGMENTIN) 875-125 MG tablet Take 1 tablet by mouth 2 (two) times daily. Patient not taking: Reported on 09/19/2015 09/13/15   Hosie Poisson, MD  benzonatate (TESSALON) 200 MG capsule Take 1 capsule (200 mg total) by mouth 3 (three) times daily as needed for cough. Patient not taking: Reported on 09/19/2015 09/13/15   Hosie Poisson, MD  predniSONE (DELTASONE) 20 MG tablet Take 2 tablets (40 mg total) by mouth daily before breakfast. Patient not taking: Reported on 09/19/2015 09/13/15   Hosie Poisson, MD   BP 141/65 mmHg  Pulse 94  Temp(Src) 98.4 F (36.9 C) (Oral)  Resp 20  SpO2 95% Physical Exam Physical Exam  Nursing note and vitals reviewed. Constitutional: Well developed, well nourished, non-toxic, and in no acute distress Head: Normocephalic and atraumatic.  Mouth/Throat: Oropharynx is clear and moist.  Neck: Normal range of motion. Neck supple.  Cardiovascular: Normal rate and regular rhythm.   Pulmonary/Chest: Effort normal and breath sounds normal. Exquisite tenderness to palpation over the right side of lower chest wall under and lateral to the right breast.  Abdominal: Soft. There is no tenderness. There is no rebound and no guarding. No CVA tenderness.  Musculoskeletal: Normal range of motion.  Neurological: Alert, no facial droop, fluent speech, moves all extremities symmetrically Skin: Skin is warm and dry.  Psychiatric: Cooperative  ED Course  Procedures (including critical care time) Labs Review Labs Reviewed  BASIC METABOLIC PANEL - Abnormal; Notable for the following:    Chloride 100 (*)    Glucose, Bld 106 (*)    BUN 22 (*)    Creatinine, Ser 1.08 (*)    GFR calc non Af Amer 50 (*)    GFR calc Af Amer 58 (*)    All  other components within normal limits  CBC - Abnormal; Notable for the following:    WBC 11.7 (*)    RDW 16.6 (*)    Platelets 523 (*)    All other components within normal limits  URINALYSIS, ROUTINE W REFLEX MICROSCOPIC (NOT AT [facility])  LIPASE, BLOOD  HEPATIC FUNCTION PANEL  PROCALCITONIN  I-STAT TROPOININ, ED    Imaging Review Dg Chest 2 View  09/19/2015  CLINICAL DATA:  Chest pain and shortness of breath EXAM: CHEST  2 VIEW COMPARISON:  September 12, 2015 and September 08, 2015 FINDINGS: There is airspace consolidation in the right upper lobe medially, overlying the hilum on the lateral view. There is persistent underlying interstitial prominence. There is patchy atelectasis in the lung bases with small pleural effusions bilaterally. Heart size and pulmonary vascularity are normal. No adenopathy. No  bone lesions are appreciable. There is postoperative change in the lower cervical spine. IMPRESSION: There is airspace consolidation in the right upper lobe medially. There is patchy bibasilar atelectasis with small pleural effusions. There is underlying interstitial prominence. There may well be a degree of underlying fibrotic type change. No change in cardiac silhouette. Followup PA and lateral chest radiographs recommended in 3-4 weeks following trial of antibiotic therapy to ensure resolution and exclude underlying malignancy. Electronically Signed   By: Lowella Grip III M.D.   On: 09/19/2015 07:58   Ct Angio Chest Pe W/cm &/or Wo Cm  09/19/2015  CLINICAL DATA:  Chest pain with difficulty breathing. History of renal cell carcinoma EXAM: CT ANGIOGRAPHY CHEST WITH CONTRAST TECHNIQUE: Multidetector CT imaging of the chest was performed using the standard protocol during bolus administration of intravenous contrast. Multiplanar CT image reconstructions and MIPs were obtained to evaluate the vascular anatomy. CONTRAST:  58m OMNIPAQUE IOHEXOL 350 MG/ML SOLN COMPARISON:  Chest radiograph September 19, 2015 FINDINGS: There is no demonstrable pulmonary embolus. There is no thoracic aortic aneurysm or dissection. There is atherosclerotic change in the aorta. There are scattered foci of atherosclerotic calcification in the visualized great vessels. : There is a small right pleural effusion. There is patchy airspace consolidation in both lung bases. In the right hilar region, there is irregular opacity with partial narrowing of the right upper lobe bronchus. This area of irregular opacity measures 4.5 x 3.6 cm. Elsewhere, there is mild scarring in the apices with bullous disease in the right apex. Thyroid appears unremarkable. Beyond apparent adenopathy in the right hilar region, no other adenopathy is appreciable. There is borderline pericardial thickening with questionable small pericardial effusion. In the visualized upper abdomen, no lesions are appreciable beyond atherosclerotic calcification in the aorta. There are no blastic or lytic bone lesions. Review of the MIP images confirms the above findings. IMPRESSION: No demonstrable pulmonary embolus. Irregular opacity in the medial right upper lobe extending to and possibly arising from the right hilum. The appearance in this area is concerning for neoplasia with surrounding pneumonitis. Note that there is narrowing of a portion of the right upper lobe bronchus without obstruction of this bronchus. There is a small right effusion with patchy airspace consolidation in both lung bases. There is no adenopathy beyond what is felt to represent lymph node prominence in the right hilum. Question small right pericardial effusion. Scattered areas of atherosclerotic change. With respect to the lesion in the right hilum/right upper lobe, bronchoscopy may be helpful for further evaluation. Alternatively, PET-CT may be helpful, particularly given history of previous renal cell carcinoma. Electronically Signed   By: WLowella GripIII M.D.   On: 09/19/2015 10:45     I have  personally reviewed and evaluated these images and lab results as part of my medical decision-making.   EKG Interpretation   Date/Time:  Monday September 19 2015 08:03:22 EST Ventricular Rate:  75 PR Interval:  121 QRS Duration: 79 QT Interval:  419 QTC Calculation: 468 R Axis:   124 Text Interpretation:  Sinus rhythm Atrial premature complexes in couplets  Left posterior fascicular block Low voltage, precordial leads No  significant change since last tracing Confirmed by Leanza Shepperson MD, Frederich Montilla (([id number] on  09/19/2015 8:59:07 AM      MDM   Final diagnoses:  Chest pain, unspecified chest pain type   ED diagnosis: Chest pain Right upper lobe lung mass  74year old female with history of COPD, RCC s/p L nephrectomy  who presents with persistent right sided lower chest pain in setting of treatment of CAP recently. Well appearing, non-toxic, and in no acute distress on presentation. Breathing comfortably on home 2 L [place], with normal oxygenation, and no conversational dyspnea. Lungs sounds clear to ausculation. Pain is reproducible with palpation over the right chest wall, under the breast into the mid axillary space. Pain was also reproducible with movement. Thus seeming most consistent with musculoskeletal pain in the setting of coughing from her pneumonia. Repeat chest x-rays performed, and she has a more prominent consolidation in her right upper lobe. When discussed with Dr. Jasmine December from radiology, this is more noticeable than on prior chest x-ray. Unclear if this is worsening pneumonia versus mass that could also be contributing to her symptoms, a CT chest was performed. This shows evidence of a right upper lobe mass in the perihilar region. This could also cause pleuritic chest pain for this patient. Remainder basic blood work including CBC, comprehensive metabolic panel, troponin are unremarkable. EKG without acute signs of ischemia or heart strain. Clinically does not have signs of worsening  pneumonia currently. Discussed with [facility], who will admit patient for coordination of care and potential bronchoscopy with biopsy.   Forde Dandy, MD 09/19/15 [phone]

## 2015-09-20 ENCOUNTER — Observation Stay (HOSPITAL_COMMUNITY): Payer: Medicare Other

## 2015-09-20 ENCOUNTER — Inpatient Hospital Stay: Payer: Medicare Other | Admitting: Physician Assistant

## 2015-09-20 ENCOUNTER — Encounter (HOSPITAL_COMMUNITY)
Admission: EM | Disposition: A | Discharge status: Home / Self Care | Payer: Self-pay | Source: Home / Self Care | Attending: Emergency Medicine

## 2015-09-20 DIAGNOSIS — C3401 Malignant neoplasm of right main bronchus: Secondary | ICD-10-CM | POA: Diagnosis not present

## 2015-09-20 DIAGNOSIS — C3411 Malignant neoplasm of upper lobe, right bronchus or lung: Secondary | ICD-10-CM | POA: Diagnosis not present

## 2015-09-20 DIAGNOSIS — C3490 Malignant neoplasm of unspecified part of unspecified bronchus or lung: Secondary | ICD-10-CM | POA: Diagnosis not present

## 2015-09-20 DIAGNOSIS — R918 Other nonspecific abnormal finding of lung field: Secondary | ICD-10-CM | POA: Diagnosis not present

## 2015-09-20 DIAGNOSIS — C642 Malignant neoplasm of left kidney, except renal pelvis: Secondary | ICD-10-CM | POA: Diagnosis not present

## 2015-09-20 DIAGNOSIS — R071 Chest pain on breathing: Secondary | ICD-10-CM

## 2015-09-20 HISTORY — PX: VIDEO BRONCHOSCOPY: SHX5072

## 2015-09-20 LAB — CBC
HCT: 38.8 % (ref 36.0–46.0)
Hemoglobin: 12.2 g/dL (ref 12.0–15.0)
MCH: 29.8 pg (ref 26.0–34.0)
MCHC: 31.4 g/dL (ref 30.0–36.0)
MCV: 94.6 fL (ref 78.0–100.0)
Platelets: 469 10*3/uL — ABNORMAL HIGH (ref 150–400)
RBC: 4.1 MIL/uL (ref 3.87–5.11)
RDW: 16.7 % — ABNORMAL HIGH (ref 11.5–15.5)
WBC: 9.4 10*3/uL (ref 4.0–10.5)

## 2015-09-20 LAB — BASIC METABOLIC PANEL
Anion gap: 6 (ref 5–15)
BUN: 18 mg/dL (ref 6–20)
CO2: 31 mmol/L (ref 22–32)
Calcium: 9.5 mg/dL (ref 8.9–10.3)
Chloride: 101 mmol/L (ref 101–111)
Creatinine, Ser: 1.04 mg/dL — ABNORMAL HIGH (ref 0.44–1.00)
GFR calc Af Amer: >60 mL/min (ref >60)
GFR calc non Af Amer: 52 mL/min — ABNORMAL LOW (ref >60)
Glucose, Bld: 104 mg/dL — ABNORMAL HIGH (ref 65–99)
Potassium: 4.4 mmol/L (ref 3.5–5.1)
Sodium: 138 mmol/L (ref 135–145)

## 2015-09-20 LAB — PROTIME-INR
INR: 0.94 (ref 0.00–1.49)
Prothrombin Time: 12.8 seconds (ref 11.6–15.2)

## 2015-09-20 SURGERY — VIDEO BRONCHOSCOPY WITHOUT FLUORO
Anesthesia: Moderate Sedation | Laterality: Bilateral

## 2015-09-20 MED ORDER — FENTANYL CITRATE (PF) 100 MCG/2ML IJ SOLN
INTRAMUSCULAR | Status: AC
  Filled 2015-09-20: qty 4

## 2015-09-20 MED ORDER — PHENYLEPHRINE HCL 0.25 % NA SOLN
NASAL | Status: DC | PRN
  Administered 2015-09-20: 2 via NASAL

## 2015-09-20 MED ORDER — FENTANYL CITRATE (PF) 100 MCG/2ML IJ SOLN
INTRAMUSCULAR | Status: DC | PRN
  Administered 2015-09-20 (×4): 25 ug via INTRAVENOUS

## 2015-09-20 MED ORDER — HYDROCODONE-ACETAMINOPHEN 5-325 MG PO TABS: 1.0000 | ORAL_TABLET | Freq: Four times a day (QID) | ORAL | Status: DC | PRN

## 2015-09-20 MED ORDER — LIDOCAINE HCL 1 % IJ SOLN
INTRAMUSCULAR | Status: DC | PRN
  Administered 2015-09-20: 6 mL

## 2015-09-20 MED ORDER — MIDAZOLAM HCL 10 MG/2ML IJ SOLN
INTRAMUSCULAR | Status: DC | PRN
  Administered 2015-09-20 (×3): 1 mg via INTRAVENOUS

## 2015-09-20 MED ORDER — LIDOCAINE HCL 2 % EX GEL
CUTANEOUS | Status: DC | PRN
  Administered 2015-09-20: 1

## 2015-09-20 MED ORDER — SODIUM CHLORIDE 0.9 % IV SOLN: INTRAVENOUS | Status: DC

## 2015-09-20 MED ORDER — MIDAZOLAM HCL 5 MG/ML IJ SOLN
INTRAMUSCULAR | Status: AC
  Filled 2015-09-20: qty 2

## 2015-09-20 NOTE — Progress Notes (Signed)
Name: Tammy Ball MRN: [id number] DOB: May 19, 1941    ADMISSION DATE:  09/19/2015 CONSULTATION DATE:  11/7  REFERRING MD :  Broadus John   CHIEF COMPLAINT:  Lung Mass  BRIEF PATIENT DESCRIPTION:  74 year old female w/ PMH RCC and prior L nephrectomy. Just d/c'd 11/1 after being admitted for CAP. Re-admitted on 11/7 w/ cc: right flank/chest wall pain. CT chest was obtained showing RUL lung mass for which PCCM was consulted.    SIGNIFICANT EVENTS    STUDIES:  CT chest 11/7: Irregular opacity in the medial right upper lobe extending to and possibly arising from the right hilum. The appearance in this area is concerning for neoplasia with surrounding pneumonitis. Note that there is narrowing of a portion of the right upper lobe bronchus without obstruction of this bronchus. here is a small right effusion with patchy airspace consolidation in both lung bases.  SUBJECTIVE: c/o cough C/o pain in right flank No wheeze  VITAL SIGNS: Temp:  [98 F (36.7 C)-98.6 F (37 C)] 98.6 F (37 C) (11/08 0756) Pulse Rate:  [60-94] 60 (11/08 0512) Resp:  [17-24] 22 (11/08 0855) BP: (106-162)/(42-118) 128/58 mmHg (11/08 0855) SpO2:  [94 %-98 %] 95 % (11/08 0855) Weight:  [83.462 kg (184 lb)-83.553 kg (184 lb 3.2 oz)] 83.462 kg (184 lb) (11/08 0756)  PHYSICAL EXAMINATION: General:  Elderly appearing female, NAD Neuro:  Alert and oriented, moving all ext to command. HEENT:  [place]/AT, PERRL, EOM-I and MMM. Cardiovascular:  RRR, NL S1/S2, -M/R/G. Lungs:  CTA bilaterally, pain on deep inhalation however. Abdomen:  Soft, NT, ND and +BS. Musculoskeletal:  -edema and -tenderness. Skin:  Intact.   Recent Labs Lab 09/19/15 0755 09/20/15 0550  NA 136 138  K 4.8 4.4  CL 100* 101  CO2 27 31  BUN 22* 18  CREATININE 1.08* 1.04*  GLUCOSE 106* 104*    Recent Labs Lab 09/19/15 0755 09/20/15 0550  HGB 13.1 12.2  HCT 40.4 38.8  WBC 11.7* 9.4  PLT 523* 469*   Dg Chest 2 View  09/19/2015  CLINICAL  DATA:  Chest pain and shortness of breath EXAM: CHEST  2 VIEW COMPARISON:  September 12, 2015 and September 08, 2015 FINDINGS: There is airspace consolidation in the right upper lobe medially, overlying the hilum on the lateral view. There is persistent underlying interstitial prominence. There is patchy atelectasis in the lung bases with small pleural effusions bilaterally. Heart size and pulmonary vascularity are normal. No adenopathy. No bone lesions are appreciable. There is postoperative change in the lower cervical spine. IMPRESSION: There is airspace consolidation in the right upper lobe medially. There is patchy bibasilar atelectasis with small pleural effusions. There is underlying interstitial prominence. There may well be a degree of underlying fibrotic type change. No change in cardiac silhouette. Followup PA and lateral chest radiographs recommended in 3-4 weeks following trial of antibiotic therapy to ensure resolution and exclude underlying malignancy. Electronically Signed   By: Lowella Grip III M.D.   On: 09/19/2015 07:58   Ct Angio Chest Pe W/cm &/or Wo Cm  09/19/2015  CLINICAL DATA:  Chest pain with difficulty breathing. History of renal cell carcinoma EXAM: CT ANGIOGRAPHY CHEST WITH CONTRAST TECHNIQUE: Multidetector CT imaging of the chest was performed using the standard protocol during bolus administration of intravenous contrast. Multiplanar CT image reconstructions and MIPs were obtained to evaluate the vascular anatomy. CONTRAST:  55m OMNIPAQUE IOHEXOL 350 MG/ML SOLN COMPARISON:  Chest radiograph September 19, 2015 FINDINGS: There is  no demonstrable pulmonary embolus. There is no thoracic aortic aneurysm or dissection. There is atherosclerotic change in the aorta. There are scattered foci of atherosclerotic calcification in the visualized great vessels. : There is a small right pleural effusion. There is patchy airspace consolidation in both lung bases. In the right hilar region, there is  irregular opacity with partial narrowing of the right upper lobe bronchus. This area of irregular opacity measures 4.5 x 3.6 cm. Elsewhere, there is mild scarring in the apices with bullous disease in the right apex. Thyroid appears unremarkable. Beyond apparent adenopathy in the right hilar region, no other adenopathy is appreciable. There is borderline pericardial thickening with questionable small pericardial effusion. In the visualized upper abdomen, no lesions are appreciable beyond atherosclerotic calcification in the aorta. There are no blastic or lytic bone lesions. Review of the MIP images confirms the above findings. IMPRESSION: No demonstrable pulmonary embolus. Irregular opacity in the medial right upper lobe extending to and possibly arising from the right hilum. The appearance in this area is concerning for neoplasia with surrounding pneumonitis. Note that there is narrowing of a portion of the right upper lobe bronchus without obstruction of this bronchus. There is a small right effusion with patchy airspace consolidation in both lung bases. There is no adenopathy beyond what is felt to represent lymph node prominence in the right hilum. Question small right pericardial effusion. Scattered areas of atherosclerotic change. With respect to the lesion in the right hilum/right upper lobe, bronchoscopy may be helpful for further evaluation. Alternatively, PET-CT may be helpful, particularly given history of previous renal cell carcinoma. Electronically Signed   By: Lowella Grip III M.D.   On: 09/19/2015 10:45    ASSESSMENT / PLAN: Right sided pleuritic chest pain Small right effusion Recent CAP RUL Lung mass  Plan Bronchoscopy today  -risks & benefits discussed Would treat pleurisy w/ NSAIDs   Rigoberto Noel, MD  [phone] 09/20/2015, 9:26 AM

## 2015-09-20 NOTE — Discharge Summary (Signed)
Physician Discharge Summary  Tammy Ball:[id number] DOB: [birth year] DOA: 09/19/2015  PCP: [name], MD  Admit date: 09/19/2015 Discharge date: 09/20/2015  Time spent:45 minutes  Recommendations for Outpatient Follow-up:  1. Dr.Wert 11/15 at 3:45pm, please FU Biopsy, brushings  Discharge Diagnoses:    Pleurisy   Pleuritic Chest pain   RUL Lung mass   Hypertension   COPD (chronic obstructive pulmonary disease) ([place])   Renal cell cancer ([place])   Lung mass    Discharge Condition:stable  Diet recommendation: heart healthy  Filed Weights   09/20/15 0512 09/20/15 0756  Weight: 83.553 kg (184 lb 3.2 oz) 83.462 kg (184 lb)    History of present illness:  Chief Complaint: chest pain HPI: Tammy Ball is a 74 y.o. female with past medical history of COPD, former smoker, hypertension, history of renal cell carcinoma status post left nephrectomy,, was just discharged from [facility] hospital on 11/1 after treatment for sepsis secondary to community-acquired pneumonia. She was sent home on Augmentin, prednisone as well as home O2 at 2 L. She reports feeling well and continuing to improve since discharge, completed her antibiotic course. Denied having any fevers, and her breathing continued to improve, 11/6 afternoon started experiencing pain in the right side of her chest, pleuritic, radiating along the right chest wall.  Hospital Course:   Lung mass -Concerning for malignancy, former smoker greater than 50-pack-year history -Could be primary lung versus metastatic RCC -Pulmonary consulted, underwent Video bronchoscopy and biopsy today -Pulm FU made for next week, FU biopsy results   Pleurisy -ideally NSAIDs recommended, but given solitary kidney she afraid to use them, VIcodin prescribed  Recent pneumonia -Likely postobstructive -Completed 10 day course of antibiotics, no fever or leukocytosis, monitor clinically  COPD -now on 2L Home O2,  -Stable, nebs when  necessary  Hypertension -Stable continue benazepril   Renal cell cancer  -Status post left nephrectomy  Procedures:  Video Bronchoscopy and Brushings 11/8  Consultations:  [facility]  Discharge Exam: Filed Vitals:   09/20/15 0951  BP: 114/50  Pulse: 79  Temp: 98.3 F (36.8 C)  Resp: 19    General: AAOx3 Cardiovascular: S1S2/RRR Respiratory: ROnchi at R base-improved  Discharge Instructions   Discharge Instructions    Diet - low sodium heart healthy    Complete by:  As directed      Increase activity slowly    Complete by:  As directed           Current Discharge Medication List    START taking these medications   Details  HYDROcodone-acetaminophen (NORCO/VICODIN) 5-325 MG tablet Take 1 tablet by mouth every 6 (six) hours as needed for moderate pain. Qty: 30 tablet, Refills: 0      CONTINUE these medications which have NOT CHANGED   Details  acetaminophen (TYLENOL) 500 MG tablet Take 1,000 mg by mouth every 6 (six) hours as needed for mild pain, moderate pain, fever or headache.    albuterol (PROVENTIL HFA;VENTOLIN HFA) 108 (90 BASE) MCG/ACT inhaler Inhale 2 puffs into the lungs every 4 (four) hours as needed for wheezing or shortness of breath. Qty: 1 Inhaler, Refills: 0   Associated Diagnoses: Asthma    aspirin EC 81 MG tablet Take 81 mg by mouth daily.    atorvastatin (LIPITOR) 40 MG tablet TAKE ONE TABLET BY MOUTH ONCE DAILY. Qty: 30 tablet, Refills: 2    benazepril (LOTENSIN) 40 MG tablet Take 1 tablet (40 mg total) by mouth daily. Qty: 90 tablet, Refills:  0   Associated Diagnoses: Essential hypertension    CALCIUM PO Take 1 tablet by mouth daily.    Cholecalciferol (VITAMIN D-3 PO) Take 1 tablet by mouth daily.    fish oil-omega-3 fatty acids 1000 MG capsule Take 1 g by mouth daily.      fluticasone (FLONASE) 50 MCG/ACT nasal spray Place 2 sprays into both nostrils daily. Qty: 9.9 g, Refills: 0    ipratropium-albuterol (DUONEB)  0.5-2.5 (3) MG/3ML SOLN Take 3 mLs by nebulization 3 (three) times daily. Qty: 360 mL, Refills: 1    mometasone-formoterol (DULERA) 200-5 MCG/ACT AERO Inhale 2 puffs into the lungs 2 (two) times daily. Qty: 1 Inhaler, Refills: 3   Associated Diagnoses: Acute bronchitis, unspecified organism    polyvinyl alcohol-povidone (REFRESH) 1.4-0.6 % ophthalmic solution Place 2 drops into both eyes daily as needed (for dry eyes).    benzonatate (TESSALON) 200 MG capsule Take 1 capsule (200 mg total) by mouth 3 (three) times daily as needed for cough. Qty: 20 capsule, Refills: 0      STOP taking these medications     chlorpheniramine-HYDROcodone (TUSSIONEX PENNKINETIC ER) 10-8 MG/5ML SUER      amoxicillin-clavulanate (AUGMENTIN) 875-125 MG tablet      predniSONE (DELTASONE) 20 MG tablet        Allergies  Allergen Reactions  . Bee Venom Anaphylaxis, Shortness Of Breath and Swelling    Swelling at site   . Levofloxacin Other (See Comments)    Joint pain   . Amlodipine Swelling    Swelling of the ankles and hands   . Hctz [Hydrochlorothiazide] Palpitations and Other (See Comments)    Sweating    Follow-up Information    Follow up with Christinia Gully, MD On 09/27/2015.   Specialty:  Pulmonary Disease   Why:  at 3:45pm   Contact information:   [address] [place] [address] [phone]        The results of significant diagnostics from this hospitalization (including imaging, microbiology, ancillary and laboratory) are listed below for reference.    Significant Diagnostic Studies: Dg Chest 2 View  09/19/2015  CLINICAL DATA:  Chest pain and shortness of breath EXAM: CHEST  2 VIEW COMPARISON:  September 12, 2015 and September 08, 2015 FINDINGS: There is airspace consolidation in the right upper lobe medially, overlying the hilum on the lateral view. There is persistent underlying interstitial prominence. There is patchy atelectasis in the lung bases with small pleural effusions  bilaterally. Heart size and pulmonary vascularity are normal. No adenopathy. No bone lesions are appreciable. There is postoperative change in the lower cervical spine. IMPRESSION: There is airspace consolidation in the right upper lobe medially. There is patchy bibasilar atelectasis with small pleural effusions. There is underlying interstitial prominence. There may well be a degree of underlying fibrotic type change. No change in cardiac silhouette. Followup PA and lateral chest radiographs recommended in 3-4 weeks following trial of antibiotic therapy to ensure resolution and exclude underlying malignancy. Electronically Signed   By: Lowella Grip III M.D.   On: 09/19/2015 07:58   Dg Chest 2 View  09/12/2015  CLINICAL DATA:  Recent pneumonia.  History of renal carcinoma EXAM: CHEST  2 VIEW COMPARISON:  September 08, 2015 FINDINGS: There is persistent posterior right base infiltrate, perhaps minimally less than on recent prior study. Elsewhere the interstitium is diffusely prominent. No other airspace consolidation is seen, however. The heart size and pulmonary vascularity are normal. No adenopathy. There is postoperative change in the  lower cervical spine region. IMPRESSION: Marginally less infiltrate in the posterior right base compared to recent prior study. The interstitium in the mid lower lung zones is prominent. These changes probably represent a degree of underlying interstitial fibrotic change. No new opacity. No change in cardiac silhouette. Electronically Signed   By: Lowella Grip III M.D.   On: 09/12/2015 12:01   Dg Chest 2 View  09/08/2015  CLINICAL DATA:  Cough, congestion EXAM: CHEST  2 VIEW COMPARISON:  10/27/2014 FINDINGS: Cardiomediastinal silhouette is stable. Hyperinflation is noted. Bilateral perihilar bronchitic changes. There is right base posterior medially infiltrate suspicious for pneumonia best seen on lateral view. Follow-up to resolution is recommended. IMPRESSION:  Hyperinflation is noted. Bilateral perihilar bronchitic changes. There is right base posterior medially infiltrate suspicious for pneumonia best seen on lateral view. Follow-up to resolution is recommended. Electronically Signed   By: Lahoma Crocker M.D.   On: 09/08/2015 09:53   Ct Angio Chest Pe W/cm &/or Wo Cm  09/19/2015  CLINICAL DATA:  Chest pain with difficulty breathing. History of renal cell carcinoma EXAM: CT ANGIOGRAPHY CHEST WITH CONTRAST TECHNIQUE: Multidetector CT imaging of the chest was performed using the standard protocol during bolus administration of intravenous contrast. Multiplanar CT image reconstructions and MIPs were obtained to evaluate the vascular anatomy. CONTRAST:  50m OMNIPAQUE IOHEXOL 350 MG/ML SOLN COMPARISON:  Chest radiograph September 19, 2015 FINDINGS: There is no demonstrable pulmonary embolus. There is no thoracic aortic aneurysm or dissection. There is atherosclerotic change in the aorta. There are scattered foci of atherosclerotic calcification in the visualized great vessels. : There is a small right pleural effusion. There is patchy airspace consolidation in both lung bases. In the right hilar region, there is irregular opacity with partial narrowing of the right upper lobe bronchus. This area of irregular opacity measures 4.5 x 3.6 cm. Elsewhere, there is mild scarring in the apices with bullous disease in the right apex. Thyroid appears unremarkable. Beyond apparent adenopathy in the right hilar region, no other adenopathy is appreciable. There is borderline pericardial thickening with questionable small pericardial effusion. In the visualized upper abdomen, no lesions are appreciable beyond atherosclerotic calcification in the aorta. There are no blastic or lytic bone lesions. Review of the MIP images confirms the above findings. IMPRESSION: No demonstrable pulmonary embolus. Irregular opacity in the medial right upper lobe extending to and possibly arising from the right  hilum. The appearance in this area is concerning for neoplasia with surrounding pneumonitis. Note that there is narrowing of a portion of the right upper lobe bronchus without obstruction of this bronchus. There is a small right effusion with patchy airspace consolidation in both lung bases. There is no adenopathy beyond what is felt to represent lymph node prominence in the right hilum. Question small right pericardial effusion. Scattered areas of atherosclerotic change. With respect to the lesion in the right hilum/right upper lobe, bronchoscopy may be helpful for further evaluation. Alternatively, PET-CT may be helpful, particularly given history of previous renal cell carcinoma. Electronically Signed   By: WLowella GripIII M.D.   On: 09/19/2015 10:45   Ct Maxillofacial Wo Cm  09/10/2015  CLINICAL DATA:  COPD, sinus infections, fever, chills, shortness of breath and cough. EXAM: CT MAXILLOFACIAL WITHOUT CONTRAST TECHNIQUE: Multidetector CT imaging of the maxillofacial structures was performed. Multiplanar CT image reconstructions were also generated. A small metallic BB was placed on the right temple in order to reliably differentiate right from left. COMPARISON:  None. FINDINGS: Paranasal sinuses and  mastoid air cells are normally aerated and show no evidence of mucosal thickening or air-fluid levels. The nasal septum is in the midline. No evidence of soft tissue masses or lymphadenopathy. The orbits are symmetric and normal in appearance bilaterally. The visualized cervical spine shows degenerative changes. Bones of the face are unremarkable. The temporomandibular joints show normal alignment. No inflammatory process, cellulitis or abscess is identified. IMPRESSION: Normal maxillofacial CT. No evidence of sinusitis or other inflammatory process. Electronically Signed   By: Aletta Edouard M.D.   On: 09/10/2015 12:49    Microbiology: No results found for this or any previous visit (from the past 240  hour(s)).   Labs: Basic Metabolic Panel:  Recent Labs Lab 09/19/15 0755 09/20/15 0550  NA 136 138  K 4.8 4.4  CL 100* 101  CO2 27 31  GLUCOSE 106* 104*  BUN 22* 18  CREATININE 1.08* 1.04*  CALCIUM 9.2 9.5   Liver Function Tests:  Recent Labs Lab 09/19/15 0755  AST 16  ALT 17  ALKPHOS 70  BILITOT 0.9  PROT 7.1  ALBUMIN 3.8    Recent Labs Lab 09/19/15 0755  LIPASE 42   No results for input(s): AMMONIA in the last 168 hours. CBC:  Recent Labs Lab 09/19/15 0755 09/20/15 0550  WBC 11.7* 9.4  HGB 13.1 12.2  HCT 40.4 38.8  MCV 92.7 94.6  PLT 523* 469*   Cardiac Enzymes: No results for input(s): CKTOTAL, CKMB, CKMBINDEX, TROPONINI in the last 168 hours. BNP: BNP (last 3 results) No results for input(s): BNP in the last 8760 hours.  ProBNP (last 3 results) No results for input(s): PROBNP in the last 8760 hours.  CBG: No results for input(s): GLUCAP in the last 168 hours.     SignedDomenic Polite  [facility] 09/20/2015, 10:30 AM

## 2015-09-20 NOTE — Progress Notes (Signed)
Video bronchoscopy with washing intervention, biopsy intervention, brushing intervention. All vitals good thru out procedure. Called Abby Pinnix RN at [phone] to give report on patient.

## 2015-09-20 NOTE — Procedures (Signed)
Bronchoscopy Procedure Note Tammy Ball [id number] Mar 27, 1941  Procedure: Bronchoscopy Indications: Diagnostic evaluation of the airways  Procedure Details Consent: Risks of procedure as well as the alternatives and risks of each were explained to the (patient/caregiver).  Consent for procedure obtained. Time Out: Verified patient identification, verified procedure, site/side was marked, verified correct patient position, special equipment/implants available, medications/allergies/relevent history reviewed, required imaging and test results available.  Performed  In preparation for procedure, bronchoscope lubricated. Sedation: 3 mg versed / 100 mcg fentanyl in divided doses  Airway entered and the following bronchi were examined: Bronchi.  Thick white purulent secretions removed. bscope had to be removed due to thick plug & reintroduced. Whitish endobrochial mass seen blocking entry of RUl bronchus.  Procedures performed: Endobronchial Biopsies x 4 , brushings x 2 & BAL were obtained fromt his area Bronchoscope removed.    Evaluation Hemodynamic Status: BP stable throughout; O2 sats: transiently fell during during procedure Patient's Current Condition: stable Specimens:   purulent fluid BAL #1 for micro, BAL # 2 bloody for cytology, Endobronchial Biopsies, brushings Complications: No apparent complications Patient did tolerate procedure well.   Rigoberto Noel MD 09/20/2015

## 2015-09-21 ENCOUNTER — Telehealth: Payer: Self-pay | Admitting: *Deleted

## 2015-09-21 ENCOUNTER — Encounter: Payer: Self-pay | Admitting: *Deleted

## 2015-09-21 ENCOUNTER — Encounter (HOSPITAL_COMMUNITY): Payer: Self-pay | Admitting: Pulmonary Disease

## 2015-09-21 ENCOUNTER — Telehealth: Payer: Self-pay | Admitting: Pulmonary Disease

## 2015-09-21 DIAGNOSIS — C3401 Malignant neoplasm of right main bronchus: Secondary | ICD-10-CM | POA: Diagnosis not present

## 2015-09-21 DIAGNOSIS — R918 Other nonspecific abnormal finding of lung field: Secondary | ICD-10-CM

## 2015-09-21 DIAGNOSIS — J438 Other emphysema: Secondary | ICD-10-CM

## 2015-09-21 DIAGNOSIS — C3490 Malignant neoplasm of unspecified part of unspecified bronchus or lung: Secondary | ICD-10-CM

## 2015-09-21 LAB — PROCALCITONIN: Procalcitonin: <0.1 ng/mL

## 2015-09-21 NOTE — Progress Notes (Signed)
Pt seen and examined at bedside, wants to go home, stable for d/c. Please see discharge summary from Dr. Broadus John 09/20/2015.   Faye Ramsay, MD  [facility] Pager [phone]  If 7PM-7AM, please contact night-coverage [email or web address] Password [email or web address]

## 2015-09-21 NOTE — Telephone Encounter (Signed)
Discussed with pt - that biopsy showed lung cancer Needs PET scan scheduled Out pt appt with [place]

## 2015-09-21 NOTE — Progress Notes (Signed)
Pt discharged via wheelchair. On 2 L [place] with home oxygen. Discharge instructions were given to pt and reviewed upcoming appt and medications with pt. No complaints or questions from the pt at this time.  Abrianna Sidman W Jaylinn Hellenbrand, RN

## 2015-09-21 NOTE — Telephone Encounter (Signed)
Oncology Nurse Navigator Documentation  Oncology Nurse Navigator Flowsheets 09/21/2015  Referral date to RadOnc/MedOnc 09/21/2015  Navigator Encounter Type Introductory phone call/I received a referral today from Dr. Bari Mantis office.  I scheduled patient to be seen on 10/03/15 arrive at 1:45 with labs at 2:00.  She verbalized understanding of appt time and place.   Patient Visit Type Initial  Treatment Phase Abnormal Scans  Interventions Coordination of Care  Coordination of Care MD Appointments  Time Spent with Patient 15

## 2015-09-21 NOTE — Telephone Encounter (Signed)
Orders entered for PET and Oncology referral. Nothing further needed. Closing encounter

## 2015-09-21 NOTE — Discharge Instructions (Signed)

## 2015-09-22 LAB — CULTURE, BAL-QUANTITATIVE W GRAM STAIN
Colony Count: 80000
Special Requests: NORMAL

## 2015-09-22 LAB — CULTURE, BAL-QUANTITATIVE

## 2015-09-27 ENCOUNTER — Ambulatory Visit (INDEPENDENT_AMBULATORY_CARE_PROVIDER_SITE_OTHER): Payer: Medicare Other | Admitting: Internal Medicine

## 2015-09-27 ENCOUNTER — Encounter: Payer: Self-pay | Admitting: Internal Medicine

## 2015-09-27 VITALS — BP 156/84 | HR 112 | Ht 64.0 in | Wt 178.8 lb

## 2015-09-27 DIAGNOSIS — J449 Chronic obstructive pulmonary disease, unspecified: Secondary | ICD-10-CM

## 2015-09-27 DIAGNOSIS — I10 Essential (primary) hypertension: Secondary | ICD-10-CM | POA: Diagnosis not present

## 2015-09-27 DIAGNOSIS — R918 Other nonspecific abnormal finding of lung field: Secondary | ICD-10-CM

## 2015-09-27 NOTE — Patient Instructions (Signed)
You should consider stopping your lisinopril and changing to Alternative as it may be contributing to some of your symptoms  No change on the dulera as a maintenance medication and your proair as needed   Work on inhaler technique:  relax and gently blow all the way out then take a nice smooth deep breath back in, triggering the inhaler at same time you start breathing in.  Hold for up to 5 seconds if you can. Blow out thru nose. Rinse and gargle with water when done     Pulmonary follow up is as needed

## 2015-09-27 NOTE — Progress Notes (Signed)
Subjective:     Patient ID: Tammy Ball, female   DOB: 24-Sep-1941      MRN: [id number]  HPI  74 yowf quit smoking 2012 with cough that resolved dx of copd and improved sob on advair/ rescue inhalers then dulera and did fine until late October 2016 admit:    HPI: Tammy Ball is a 74 y.o. female with past medical history of COPD, former smoker, hypertension, history of renal cell carcinoma status post left nephrectomy,, was just discharged from [facility] hospital on 11/1 after treatment for sepsis secondary to community-acquired pneumonia. She was sent home on Augmentin, prednisone as well as home O2 at 2 L. She reports feeling well and continuing to improve since discharge, completed her antibiotic course. Denied having any fevers, and her breathing continued to improve, 11/6 afternoon started experiencing pain in the right side of her chest, pleuritic, radiating along the right chest wall.  Hospital Course:  Lung mass -Concerning for malignancy, former smoker greater than 50-pack-year history -Could be primary lung versus metastatic RCC -Pulmonary consulted, underwent Video bronchoscopy and biopsy today -Pulm FU made for next week, FU biopsy results  Pleurisy -ideally NSAIDs recommended, but given solitary kidney she afraid to use them, VIcodin prescribed  Recent pneumonia -Likely postobstructive -Completed 10 day course of antibiotics, no fever or leukocytosis, monitor clinically  COPD -now on 2L Home O2,  -Stable, nebs when necessary  Hypertension -Stable continue benazepril   Renal cell cancer  -Status post left nephrectomy  Procedures:  Video Bronchoscopy and Brushings 11/8  Consultations:  Rafael Bihari    09/27/2015 1st [facility] office visit/ Aruna Nestler  / GOLD II COPD? obst mass RUL ? Resectable?  Chief Complaint  Patient presents with  . HFU    Pt states her breathing has improved since hospital d/c. She continues to have cough, sometimes  wakes up in the night. Cough is prod with white sputum.     dulera 200 2bid and rare albuterol  / fatigue > sob / min am cough no hemoptysis  No obvious day to day or daytime variability or assoc  cp or chest tightness, subjective wheeze or overt sinus or hb symptoms. No unusual exp hx or h/o childhood pna/ asthma or knowledge of premature birth.  Also denies any obvious fluctuation of symptoms with weather or environmental changes or other aggravating or alleviating factors except as outlined above   Current Medications, Allergies, Complete Past Medical History, Past Surgical History, Family History, and Social History were reviewed in [employer] record.  ROS  The following are not active complaints unless bolded sore throat, dysphagia, dental problems, itching, sneezing,  nasal congestion or excess/ purulent secretions, ear ache,   fever, chills, sweats, unintended wt loss, classically pleuritic or exertional cp, hemoptysis,  orthopnea pnd or leg swelling, presyncope, palpitations, abdominal pain, anorexia, nausea, vomiting, diarrhea  or change in bowel or bladder habits, change in stools or urine, dysuria,hematuria,  rash, arthralgias, visual complaints, headache, numbness, weakness or ataxia or problems with walking or coordination,  change in mood/affect or memory.          Review of Systems     Objective:   Physical Exam     amb wf with mild psuedowheeze/ upper way wheeze  Wt Readings from Last 3 Encounters:  09/27/15 178 lb 12.8 oz (81.103 kg)  09/20/15 184 lb (83.462 kg)  09/08/15 178 lb (80.74 kg)    Vital signs reviewed   HEENT: nl dentition,  turbinates, and oropharynx. Nl external ear canals without cough reflex   NECK :  without JVD/Nodes/TM/ nl carotid upstrokes bilaterally   LUNGS: no acc muscle use,  Minimal insp and exp rhonchi with reduced bs RUL    CV:  RRR  no s3 or murmur or increase in P2, no edema   ABD:  soft and nontender  with nl excursion in the supine position. No bruits or organomegaly, bowel sounds nl  MS:  warm without deformities, calf tenderness, cyanosis or clubbing  SKIN: warm and dry without lesions    NEURO:  alert, approp, no deficits     I personally reviewed images and agree with radiology impression as follows:  CTa chest 09/19/15 No demonstrable pulmonary embolus.  Irregular opacity in the medial right upper lobe extending to and possibly arising from the right hilum. The appearance in this area is concerning for neoplasia with surrounding pneumonitis. Note that there is narrowing of a portion of the right upper lobe bronchus without obstruction of this bronchus.  There is a small right effusion with patchy airspace consolidation in both lung bases.  There is no adenopathy beyond what is felt to represent lymph node prominence in the right hilum.     Assessment:

## 2015-09-29 ENCOUNTER — Ambulatory Visit (HOSPITAL_COMMUNITY)
Admission: RE | Admit: 2015-09-29 | Discharge: 2015-09-29 | Disposition: A | Discharge status: Home / Self Care | Payer: Medicare Other | Source: Ambulatory Visit | Attending: Pulmonary Disease | Admitting: Pulmonary Disease

## 2015-09-29 DIAGNOSIS — J439 Emphysema, unspecified: Secondary | ICD-10-CM | POA: Insufficient documentation

## 2015-09-29 DIAGNOSIS — C3491 Malignant neoplasm of unspecified part of right bronchus or lung: Secondary | ICD-10-CM | POA: Diagnosis not present

## 2015-09-29 DIAGNOSIS — C3490 Malignant neoplasm of unspecified part of unspecified bronchus or lung: Secondary | ICD-10-CM

## 2015-09-29 DIAGNOSIS — Z905 Acquired absence of kidney: Secondary | ICD-10-CM | POA: Insufficient documentation

## 2015-09-29 LAB — GLUCOSE, CAPILLARY: Glucose-Capillary: 100 mg/dL — ABNORMAL HIGH (ref 65–99)

## 2015-09-29 MED ORDER — FLUDEOXYGLUCOSE F - 18 (FDG) INJECTION
10.4000 | Freq: Once | INTRAVENOUS | Status: DC | PRN
  Administered 2015-09-29: 10.4 via INTRAVENOUS
  Filled 2015-09-29: qty 10.4

## 2015-10-01 NOTE — Assessment & Plan Note (Signed)
In the best review of chronic cough to date ( NEJM 2016 375 [phone]) ,  ACEi are now felt to cause cough in up to  20% of pts which is a 4 fold increase from previous reports and does not include the variety of non-specific complaints we see in pulmonary clinic in pts on ACEi but previously attributed to copd/asthma to include PNDS, throat and chest congestion, "bronchitis", unexplained dyspnea and noct "strangling" sensations as well as atypical /refractory GERD symptoms like atypical dysphagia.   The only way to prove this is not an "ACEi Case" is a trial off ACEi x a minimum of 6 weeks then regroup and I would have a very low threshold to stop this. Note that the benazepril is not that effective anyway at present dose

## 2015-10-01 NOTE — Assessment & Plan Note (Signed)
Complicated by hbp/ hyperlipidemia   Body mass index is 30.68 kg/(m^2).  Lab Results  Component Value Date   TSH 1.583 12/14/2011     Contributing to gerd tendency/ doe/reviewed the need and the process to achieve and maintain neg calorie balance > defer f/u primary care including intermittently monitoring thyroid status

## 2015-10-01 NOTE — Assessment & Plan Note (Addendum)
Spirometry 09/27/2015   FEV1  1.43 (67%) ratio 67 in the setting of a completely obstructed right upper lobe  She is actually relatively well compensated on her present regimen of Dulera  200 dosed 2 puffs every 12 hours and when necessary albuterol. One concern I have years that she does have upper airway wheezing which may be from the tumor but also may be aggravated by using Ace inhibitors and I would avoid these here>  See  hypertension. Did not recommend any change in her pulmonary medicines at this point.   The proper method of use, as well as anticipated side effects, of a metered-dose inhaler are discussed and demonstrated to the patient. Improved effectiveness after extensive coaching during this visit to a level of approximately  75% from a baseline of < 50% so should be able continue with dfa rx for now   Each maintenance medication was reviewed in detail including most importantly the difference between maintenance and as needed and under what circumstances the prns are to be used.  Please see instructions for details which were reviewed in writing and the patient given a copy.

## 2015-10-01 NOTE — Assessment & Plan Note (Addendum)
FOB 09/20/15  Complete obst RUL > sq cell ca  I reviewed all the x-rays with the patient and her family and the results from bronchoscopy emphasizing that squamous cell carcinoma is best approach surgically if possible and if not perhaps combination of radiation/chemotherapy then surgery might be conceivable depending on PET staging.  [place] referral probably therefore the next step p PET.   Total time devoted to counseling  = 25/26mreview case with pt/fm discussion of options/alternatives/ giving and going over instructions (see avs)

## 2015-10-03 ENCOUNTER — Telehealth: Payer: Self-pay | Admitting: Internal Medicine

## 2015-10-03 ENCOUNTER — Ambulatory Visit (HOSPITAL_BASED_OUTPATIENT_CLINIC_OR_DEPARTMENT_OTHER): Payer: Medicare Other | Admitting: Internal Medicine

## 2015-10-03 ENCOUNTER — Telehealth: Payer: Self-pay | Admitting: *Deleted

## 2015-10-03 ENCOUNTER — Other Ambulatory Visit (HOSPITAL_BASED_OUTPATIENT_CLINIC_OR_DEPARTMENT_OTHER): Payer: Medicare Other

## 2015-10-03 ENCOUNTER — Other Ambulatory Visit: Payer: Self-pay | Admitting: Internal Medicine

## 2015-10-03 ENCOUNTER — Encounter: Payer: Self-pay | Admitting: Internal Medicine

## 2015-10-03 VITALS — BP 151/61 | HR 85 | Temp 99.3°F | Resp 18 | Ht 64.0 in | Wt 177.3 lb

## 2015-10-03 DIAGNOSIS — Z803 Family history of malignant neoplasm of breast: Secondary | ICD-10-CM

## 2015-10-03 DIAGNOSIS — C3491 Malignant neoplasm of unspecified part of right bronchus or lung: Secondary | ICD-10-CM | POA: Insufficient documentation

## 2015-10-03 DIAGNOSIS — Z8052 Family history of malignant neoplasm of bladder: Secondary | ICD-10-CM

## 2015-10-03 DIAGNOSIS — Z87891 Personal history of nicotine dependence: Secondary | ICD-10-CM | POA: Diagnosis not present

## 2015-10-03 DIAGNOSIS — C3411 Malignant neoplasm of upper lobe, right bronchus or lung: Secondary | ICD-10-CM

## 2015-10-03 DIAGNOSIS — Z808 Family history of malignant neoplasm of other organs or systems: Secondary | ICD-10-CM

## 2015-10-03 DIAGNOSIS — Z8553 Personal history of malignant neoplasm of renal pelvis: Secondary | ICD-10-CM

## 2015-10-03 DIAGNOSIS — R918 Other nonspecific abnormal finding of lung field: Secondary | ICD-10-CM

## 2015-10-03 HISTORY — DX: Malignant neoplasm of unspecified part of right bronchus or lung: C34.91

## 2015-10-03 LAB — CBC WITH DIFFERENTIAL/PLATELET
BASO%: 1.3 % (ref 0.0–2.0)
Basophils Absolute: 0.1 10*3/uL (ref 0.0–0.1)
EOS%: 2.4 % (ref 0.0–7.0)
Eosinophils Absolute: 0.2 10*3/uL (ref 0.0–0.5)
HCT: 40.2 % (ref 34.8–46.6)
HGB: 12.8 g/dL (ref 11.6–15.9)
LYMPH%: 35.4 % (ref 14.0–49.7)
MCH: 29.6 pg (ref 25.1–34.0)
MCHC: 32 g/dL (ref 31.5–36.0)
MCV: 92.6 fL (ref 79.5–101.0)
MONO#: 0.6 10*3/uL (ref 0.1–0.9)
MONO%: 7.8 % (ref 0.0–14.0)
NEUT#: 3.8 10*3/uL (ref 1.5–6.5)
NEUT%: 53.1 % (ref 38.4–76.8)
Platelets: 341 10*3/uL (ref 145–400)
RBC: 4.34 10*6/uL (ref 3.70–5.45)
RDW: 16 % — ABNORMAL HIGH (ref 11.2–14.5)
WBC: 7.1 10*3/uL (ref 3.9–10.3)
lymph#: 2.5 10*3/uL (ref 0.9–3.3)

## 2015-10-03 LAB — COMPREHENSIVE METABOLIC PANEL (CC13)
ALT: 18 U/L (ref 0–55)
AST: 20 U/L (ref 5–34)
Albumin: 4.3 g/dL (ref 3.5–5.0)
Alkaline Phosphatase: 81 U/L (ref 40–150)
Anion Gap: 11 mEq/L (ref 3–11)
BUN: 24.4 mg/dL (ref 7.0–26.0)
CO2: 26 mEq/L (ref 22–29)
Calcium: 10.4 mg/dL (ref 8.4–10.4)
Chloride: 103 mEq/L (ref 98–109)
Creatinine: 1.3 mg/dL — ABNORMAL HIGH (ref 0.6–1.1)
EGFR: 41 mL/min/{1.73_m2} — ABNORMAL LOW (ref >90)
Glucose: 98 mg/dl (ref 70–140)
Potassium: 5.5 mEq/L — ABNORMAL HIGH (ref 3.5–5.1)
Sodium: 139 mEq/L (ref 136–145)
Total Bilirubin: 0.56 mg/dL (ref 0.20–1.20)
Total Protein: 7.4 g/dL (ref 6.4–8.3)

## 2015-10-03 MED ORDER — PROCHLORPERAZINE MALEATE 10 MG PO TABS: 10.0000 mg | ORAL_TABLET | Freq: Four times a day (QID) | ORAL | Status: DC | PRN

## 2015-10-03 NOTE — Progress Notes (Signed)
[place] [phone]   [phone]  CONSULT NOTE  REFERRING PHYSICIAN: Dr. Kara Mead  REASON FOR CONSULTATION:  74 years old white female recently diagnosed with lung cancer  HPI Tammy Ball is a 74 y.o. female was past medical history significant for renal cell carcinoma status post left nephrectomy by Dr. Alinda Money, hypertension, COPD, depression, dyslipidemia, recurrent pneumonia as well as long history of smoking but quit in 2012. The patient was diagnosed with pneumonia on 09/08/2015 and was admitted to the hospital for 5 days discharged on 09/13/2015. She was readmitted again to the hospital on 09/19/2015 complaining of pain on the right side of the chest and chest x-ray performed at that time showed herconsolidation in the right upper lobe medially. This was followed by CT angiogram of the chest on the same day and it showed no evidence for pulmonary embolism but there was irregular opacity in the medial right upper lobe extending to and possibly arising from the right hilum measuring 4.5 x 3.6 cm. The appearance in this area is concerning for neoplasia with surrounding pneumonitis. Note that there is narrowing of a portion of the right upper lobe bronchus without obstruction of this bronchus. There is no adenopathy beyond what is felt to represent lymph node prominence in the right hilum. On 09/20/2015 the patient underwent a video bronchoscopy with transbronchial biopsy biopsy of the right upper lobe lung mass under the care of Dr. Elsworth Soho.  The final pathology (Accession: [id number]) was consistent with squamous cell carcinoma, moderately differentiated. A PET scan on 09/29/2015 showed 4.0 cm hypermetabolic central right upper lobe mass consistent with primary bronchogenic carcinoma. This is contiguous with the right hilum. There is no other thoracic nodal or distant metastatic disease identified. Dr. Elsworth Soho kindly referred the patient to me today for  evaluation and recommendation regarding treatment of her condition. When seen today the patient is feeling fine except for weakness from the recent pneumonia. She continues to have shortness breath with exertion as well as mild cough with no hemoptysis. She denied having any significant weight loss or night sweats. The patient denied having any headaches but has some blurry vision. Family history significant for mother with bladder cancer at age 90, father died from alcoholism and maternal grandmother had pancreatic cancer in her 68s. The patient also has a sister with breast cancer. She is divorced and has 4 children. She works as a [employer]. She has a history of smoking 1 pack per day for around 50 years but quit in 2012. She has no history of alcohol or drug abuse.  HPI  Past Medical History  Diagnosis Date  . COPD (chronic obstructive pulmonary disease) ([place])   . History of kidney cancer   . Hypertension   . Depression   . Asthma   . Glaucoma   . Hyperlipidemia   . Heart murmur   . Cancer ([place])   . Non-small cell carcinoma of lung, stage 2 ([place]) 10/03/2015    Past Surgical History  Procedure Laterality Date  . Nephrectomy    . Kidney cancer    . Tubal ligation    . Spine surgery    . Eye surgery    . Video bronchoscopy Bilateral 09/20/2015    Procedure: VIDEO BRONCHOSCOPY WITHOUT FLUORO;  Surgeon: Rigoberto Noel, MD;  Location: [facility];  Service: Cardiopulmonary;  Laterality: Bilateral;    Family History  Problem Relation Age of Onset  . Heart disease Sister   .  Obesity Brother   . Heart disease Daughter   . Glaucoma Daughter   . Breast cancer Sister   . Birth defects Sister   . Cancer Mother     Bladder Cancer  . Hypertension Mother   . Heart disease Mother   . Cancer Maternal Grandmother     Social History Social History  Substance Use Topics  . Smoking status: Former Smoker -- 0.50 packs/day for 50 years    Types: Cigarettes    Quit date:  03/16/2011  . Smokeless tobacco: Never Used  . Alcohol Use: No    Allergies  Allergen Reactions  . Bee Venom Anaphylaxis, Shortness Of Breath and Swelling    Swelling at site   . Levofloxacin Other (See Comments)    Joint pain   . Amlodipine Swelling    Swelling of the ankles and hands   . Hctz [Hydrochlorothiazide] Palpitations and Other (See Comments)    Sweating     Current Outpatient Prescriptions  Medication Sig Dispense Refill  . acetaminophen (TYLENOL) 500 MG tablet Take 1,000 mg by mouth every 6 (six) hours as needed for mild pain, moderate pain, fever or headache.    . albuterol (PROVENTIL HFA;VENTOLIN HFA) 108 (90 BASE) MCG/ACT inhaler Inhale 2 puffs into the lungs every 4 (four) hours as needed for wheezing or shortness of breath. 1 Inhaler 0  . aspirin EC 81 MG tablet Take 81 mg by mouth daily.    Marland Kitchen atorvastatin (LIPITOR) 40 MG tablet TAKE ONE TABLET BY MOUTH ONCE DAILY. (Patient taking differently: Take 40 mg by mouth daily. ) 30 tablet 2  . benazepril (LOTENSIN) 40 MG tablet Take 1 tablet (40 mg total) by mouth daily. 90 tablet 0  . benzonatate (TESSALON) 200 MG capsule Take 1 capsule (200 mg total) by mouth 3 (three) times daily as needed for cough. 20 capsule 0  . CALCIUM PO Take 1 tablet by mouth daily.    . Cholecalciferol (VITAMIN D-3 PO) Take 1 tablet by mouth daily.    . fish oil-omega-3 fatty acids 1000 MG capsule Take 1 g by mouth daily.      . fluticasone (FLONASE) 50 MCG/ACT nasal spray Place 2 sprays into both nostrils daily. 9.9 g 0  . HYDROcodone-acetaminophen (NORCO/VICODIN) 5-325 MG tablet Take 1 tablet by mouth every 6 (six) hours as needed for moderate pain. 30 tablet 0  . ipratropium-albuterol (DUONEB) 0.5-2.5 (3) MG/3ML SOLN Take 3 mLs by nebulization 3 (three) times daily. 360 mL 1  . mometasone-formoterol (DULERA) 200-5 MCG/ACT AERO Inhale 2 puffs into the lungs 2 (two) times daily. 1 Inhaler 3  . polyvinyl alcohol-povidone (REFRESH) 1.4-0.6 %  ophthalmic solution Place 2 drops into both eyes daily as needed (for dry eyes).     No current facility-administered medications for this visit.   Facility-Administered Medications Ordered in Other Visits  Medication Dose Route Frequency Provider Last Rate Last Dose  . fludeoxyglucose F - 18 (FDG) injection 10.4 milli Curie  10.4 milli Curie Intravenous Once PRN Rigoberto Noel, MD   10.4 milli Curie at 09/29/15 1430    Review of Systems  Constitutional: positive for fatigue Eyes: negative Ears, nose, mouth, throat, and face: negative Respiratory: positive for cough, dyspnea on exertion and sputum Cardiovascular: negative Gastrointestinal: negative Genitourinary:negative Integument/breast: negative Hematologic/lymphatic: negative Musculoskeletal:negative Neurological: negative Behavioral/Psych: negative Endocrine: negative Allergic/Immunologic: negative  Physical Exam  RDE:YCXKG, healthy, no distress, well nourished, well developed and anxious SKIN: skin color, texture, turgor are normal, no rashes or  significant lesions HEAD: Normocephalic, No masses, lesions, tenderness or abnormalities EYES: normal, PERRLA, Conjunctiva are pink and non-injected EARS: External ears normal, Canals clear OROPHARYNX:no exudate, no erythema and lips, buccal mucosa, and tongue normal  NECK: supple, no adenopathy, no JVD LYMPH:  no palpable lymphadenopathy, no hepatosplenomegaly BREAST:not examined LUNGS: clear to auscultation , and palpation HEART: regular rate & rhythm, no murmurs and no gallops ABDOMEN:abdomen soft, non-tender, obese, normal bowel sounds and no masses or organomegaly BACK: Back symmetric, no curvature., No CVA tenderness EXTREMITIES:no joint deformities, effusion, or inflammation, no edema, no skin discoloration, no clubbing  NEURO: alert & oriented x 3 with fluent speech, no focal motor/sensory deficits  PERFORMANCE STATUS: ECOG 1  LABORATORY DATA: Lab Results    Component Value Date   WBC 7.1 10/03/2015   HGB 12.8 10/03/2015   HCT 40.2 10/03/2015   MCV 92.6 10/03/2015   PLT 341 10/03/2015      Chemistry      Component Value Date/Time   NA 139 10/03/2015 1336   NA 138 09/20/2015 0550   K 5.5* 10/03/2015 1336   K 4.4 09/20/2015 0550   CL 101 09/20/2015 0550   CO2 26 10/03/2015 1336   CO2 31 09/20/2015 0550   BUN 24.4 10/03/2015 1336   BUN 18 09/20/2015 0550   BUN 24* 12/11/2011   CREATININE 1.3* 10/03/2015 1336   CREATININE 1.04* 09/20/2015 0550   CREATININE 1.08 12/02/2013 0911   GLU 105 12/10/2009      Component Value Date/Time   CALCIUM 10.4 10/03/2015 1336   CALCIUM 9.5 09/20/2015 0550   ALKPHOS 81 10/03/2015 1336   ALKPHOS 70 09/19/2015 0755   AST 20 10/03/2015 1336   AST 16 09/19/2015 0755   ALT 18 10/03/2015 1336   ALT 17 09/19/2015 0755   BILITOT 0.56 10/03/2015 1336   BILITOT 0.9 09/19/2015 0755       RADIOGRAPHIC STUDIES: Dg Chest 2 View  09/19/2015  CLINICAL DATA:  Chest pain and shortness of breath EXAM: CHEST  2 VIEW COMPARISON:  September 12, 2015 and September 08, 2015 FINDINGS: There is airspace consolidation in the right upper lobe medially, overlying the hilum on the lateral view. There is persistent underlying interstitial prominence. There is patchy atelectasis in the lung bases with small pleural effusions bilaterally. Heart size and pulmonary vascularity are normal. No adenopathy. No bone lesions are appreciable. There is postoperative change in the lower cervical spine. IMPRESSION: There is airspace consolidation in the right upper lobe medially. There is patchy bibasilar atelectasis with small pleural effusions. There is underlying interstitial prominence. There may well be a degree of underlying fibrotic type change. No change in cardiac silhouette. Followup PA and lateral chest radiographs recommended in 3-4 weeks following trial of antibiotic therapy to ensure resolution and exclude underlying malignancy.  Electronically Signed   By: Lowella Grip III M.D.   On: 09/19/2015 07:58   Dg Chest 2 View  09/12/2015  CLINICAL DATA:  Recent pneumonia.  History of renal carcinoma EXAM: CHEST  2 VIEW COMPARISON:  September 08, 2015 FINDINGS: There is persistent posterior right base infiltrate, perhaps minimally less than on recent prior study. Elsewhere the interstitium is diffusely prominent. No other airspace consolidation is seen, however. The heart size and pulmonary vascularity are normal. No adenopathy. There is postoperative change in the lower cervical spine region. IMPRESSION: Marginally less infiltrate in the posterior right base compared to recent prior study. The interstitium in the mid lower lung zones is prominent. These  changes probably represent a degree of underlying interstitial fibrotic change. No new opacity. No change in cardiac silhouette. Electronically Signed   By: Lowella Grip III M.D.   On: 09/12/2015 12:01   Dg Chest 2 View  09/08/2015  CLINICAL DATA:  Cough, congestion EXAM: CHEST  2 VIEW COMPARISON:  10/27/2014 FINDINGS: Cardiomediastinal silhouette is stable. Hyperinflation is noted. Bilateral perihilar bronchitic changes. There is right base posterior medially infiltrate suspicious for pneumonia best seen on lateral view. Follow-up to resolution is recommended. IMPRESSION: Hyperinflation is noted. Bilateral perihilar bronchitic changes. There is right base posterior medially infiltrate suspicious for pneumonia best seen on lateral view. Follow-up to resolution is recommended. Electronically Signed   By: Lahoma Crocker M.D.   On: 09/08/2015 09:53   Ct Angio Chest Pe W/cm &/or Wo Cm  09/19/2015  CLINICAL DATA:  Chest pain with difficulty breathing. History of renal cell carcinoma EXAM: CT ANGIOGRAPHY CHEST WITH CONTRAST TECHNIQUE: Multidetector CT imaging of the chest was performed using the standard protocol during bolus administration of intravenous contrast. Multiplanar CT image  reconstructions and MIPs were obtained to evaluate the vascular anatomy. CONTRAST:  53m OMNIPAQUE IOHEXOL 350 MG/ML SOLN COMPARISON:  Chest radiograph September 19, 2015 FINDINGS: There is no demonstrable pulmonary embolus. There is no thoracic aortic aneurysm or dissection. There is atherosclerotic change in the aorta. There are scattered foci of atherosclerotic calcification in the visualized great vessels. : There is a small right pleural effusion. There is patchy airspace consolidation in both lung bases. In the right hilar region, there is irregular opacity with partial narrowing of the right upper lobe bronchus. This area of irregular opacity measures 4.5 x 3.6 cm. Elsewhere, there is mild scarring in the apices with bullous disease in the right apex. Thyroid appears unremarkable. Beyond apparent adenopathy in the right hilar region, no other adenopathy is appreciable. There is borderline pericardial thickening with questionable small pericardial effusion. In the visualized upper abdomen, no lesions are appreciable beyond atherosclerotic calcification in the aorta. There are no blastic or lytic bone lesions. Review of the MIP images confirms the above findings. IMPRESSION: No demonstrable pulmonary embolus. Irregular opacity in the medial right upper lobe extending to and possibly arising from the right hilum. The appearance in this area is concerning for neoplasia with surrounding pneumonitis. Note that there is narrowing of a portion of the right upper lobe bronchus without obstruction of this bronchus. There is a small right effusion with patchy airspace consolidation in both lung bases. There is no adenopathy beyond what is felt to represent lymph node prominence in the right hilum. Question small right pericardial effusion. Scattered areas of atherosclerotic change. With respect to the lesion in the right hilum/right upper lobe, bronchoscopy may be helpful for further evaluation. Alternatively, PET-CT may  be helpful, particularly given history of previous renal cell carcinoma. Electronically Signed   By: WLowella GripIII M.D.   On: 09/19/2015 10:45   Nm Pet Image Initial (pi) Skull Base To Thigh  09/29/2015  CLINICAL DATA:  Initial treatment strategy for right lung squamous cell carcinoma. EXAM: NUCLEAR MEDICINE PET SKULL BASE TO THIGH TECHNIQUE: 10.4 mCi F-18 FDG was injected intravenously. Full-ring PET imaging was performed from the skull base to thigh after the radiotracer. CT data was obtained and used for attenuation correction and anatomic localization. FASTING BLOOD GLUCOSE:  Value: 100 mg/dl COMPARISON:  Chest CT on 09/19/2015 FINDINGS: NECK No hypermetabolic lymph nodes in the neck. CHEST 4 cm mass with areas of cavitation  in the central right upper lobe is hypermetabolic, with SUV max of 24.6. This is contiguous with the right hilum. No hypermetabolic mediastinal or left hilar lymph nodes are seen. Mild emphysema noted.  No evidence of pleural effusion. ABDOMEN/PELVIS No abnormal hypermetabolic activity within the liver, pancreas, adrenal glands, or spleen. No hypermetabolic lymph nodes in the abdomen or pelvis. Left kidney is absent. SKELETON No focal hypermetabolic activity to suggest skeletal metastasis. IMPRESSION: 4 cm hypermetabolic central right upper lobe mass, consistent with primary bronchogenic carcinoma. This is contiguous with the right hilum. No other thoracic nodal or distant metastatic disease identified. Emphysema.  Absence of left kidney also noted. Electronically Signed   By: Earle Gell M.D.   On: 09/29/2015 16:44   Ct Maxillofacial Wo Cm  09/10/2015  CLINICAL DATA:  COPD, sinus infections, fever, chills, shortness of breath and cough. EXAM: CT MAXILLOFACIAL WITHOUT CONTRAST TECHNIQUE: Multidetector CT imaging of the maxillofacial structures was performed. Multiplanar CT image reconstructions were also generated. A small metallic BB was placed on the right temple in order  to reliably differentiate right from left. COMPARISON:  None. FINDINGS: Paranasal sinuses and mastoid air cells are normally aerated and show no evidence of mucosal thickening or air-fluid levels. The nasal septum is in the midline. No evidence of soft tissue masses or lymphadenopathy. The orbits are symmetric and normal in appearance bilaterally. The visualized cervical spine shows degenerative changes. Bones of the face are unremarkable. The temporomandibular joints show normal alignment. No inflammatory process, cellulitis or abscess is identified. IMPRESSION: Normal maxillofacial CT. No evidence of sinusitis or other inflammatory process. Electronically Signed   By: Aletta Edouard M.D.   On: 09/10/2015 12:49    ASSESSMENT: This is a very pleasant 74 years old white female recently diagnosed with a stage IIa (T2a, N1, M0) non-small cell lung cancer, squamous cell carcinoma presented with large right upper lobe lung mass with right hilar involvement diagnosed in November 2016.   PLAN: I had a lengthy discussion with the patient today about her current disease stage, prognosis and treatment options. I recommended for the patient to complete the staging workup by ordering a MRI of the brain to rule out brain metastasis. We discussed the patient his case at the weekly thoracic conference and it was felt that the patient may benefit from a course of neoadjuvant systemic chemotherapy before consideration of surgical resection. After completion of the neoadjuvant systemic chemotherapy, the patient would have repeat imaging study and if she is not a surgical candidate, she would be treated with a course of concurrent chemoradiation. I recommended for the patient a course of neoadjuvant systemic chemotherapy with carboplatin for AUC of 6 and paclitaxel 200 MG/M2 every 3 weeks with Neulasta support for a total of 3 cycles. I discussed with the patient adverse effect of this treatment including but not limited  to alopecia, myelosuppression, nausea and vomiting, peripheral neuropathy, liver or renal dysfunction. The patient would like to proceed with treatment as planned and she is expected to start the first dose of this treatment on 10/11/2015. I will arrange for the patient to receive a chemotherapy education class before starting the first dose of her treatment. I will start the patient on Compazine 10 mg by mouth every 6 hours as needed for nausea. She will come back for follow-up visit in 2 weeks for reevaluation and management of any adverse effect of her treatment. The patient was advised to call immediately if she has any concerning symptoms in the  interval. The patient voices understanding of current disease status and treatment options and is in agreement with the current care plan.  All questions were answered. The patient knows to call the clinic with any problems, questions or concerns. We can certainly see the patient much sooner if necessary.  Thank you so much for allowing me to participate in the care of Tammy Ball. I will continue to follow up the patient with you and assist in her care.  I spent 55 minutes counseling the patient face to face. The total time spent in the appointment was 80 minutes.  Disclaimer: This note was dictated with voice recognition software. Similar sounding words can inadvertently be transcribed and may not be corrected upon review.   Tammy Ball K. October 03, 2015, 3:12 PM

## 2015-10-03 NOTE — Telephone Encounter (Signed)
GAVE PATIENT AVS REPORT AND APPOINTMENTS FOR November THRU January.

## 2015-10-03 NOTE — Telephone Encounter (Signed)
Oncology Nurse Navigator Documentation  Oncology Nurse Navigator Flowsheets 10/03/2015  Navigator Encounter Type Clinic/MDC/spoke with patient today at [facility]  She is newly dx NSCLC squamous histology.  Dr. Julien Nordmann review tx plan with patient.  I gave her educational materials regarding cancer dx.    Patient Visit Type Initial  Treatment Phase Abnormal Scans  Barriers/Navigation Needs Education  Education Newly Diagnosed Cancer Education  Time Spent with Patient 30

## 2015-10-03 NOTE — Telephone Encounter (Signed)
ADD TO PREVIOUS NOTE. [facility] WILL CALL WITH MRI APPOINTMENT - PATIENT AWARE.

## 2015-10-05 ENCOUNTER — Ambulatory Visit (INDEPENDENT_AMBULATORY_CARE_PROVIDER_SITE_OTHER): Payer: Medicare Other | Admitting: Family Medicine

## 2015-10-05 ENCOUNTER — Telehealth: Payer: Self-pay | Admitting: Emergency Medicine

## 2015-10-05 VITALS — BP 142/80 | HR 104 | Temp 99.0°F | Resp 17 | Ht 64.0 in | Wt 173.0 lb

## 2015-10-05 DIAGNOSIS — I10 Essential (primary) hypertension: Secondary | ICD-10-CM

## 2015-10-05 DIAGNOSIS — F411 Generalized anxiety disorder: Secondary | ICD-10-CM

## 2015-10-05 DIAGNOSIS — C3490 Malignant neoplasm of unspecified part of unspecified bronchus or lung: Secondary | ICD-10-CM

## 2015-10-05 MED ORDER — VALSARTAN 80 MG PO TABS: 80.0000 mg | ORAL_TABLET | Freq: Every day | ORAL | Status: DC

## 2015-10-05 MED ORDER — ATORVASTATIN CALCIUM 40 MG PO TABS: ORAL_TABLET | ORAL | Status: DC

## 2015-10-05 MED ORDER — LORAZEPAM 0.5 MG PO TABS: 0.5000 mg | ORAL_TABLET | Freq: Two times a day (BID) | ORAL | Status: DC | PRN

## 2015-10-05 NOTE — Patient Instructions (Addendum)
Use Afrin before the MRI to really reduce the post nasal drainage.  Take an Ativan 1-2 hours before the MRI, repeat if still anxious at the MRI facility.

## 2015-10-05 NOTE — Telephone Encounter (Signed)
Called by Dr Joseph Art from urgent care. He is seeing Tammy Ball for continued cough, notes that she is on benazepril and wanted to discuss changing this with Dr Melvyn Novas. I told him that I believed Dr Melvyn Novas would be in favor of a change, recommended valsartan '80mg'$  qd to start. He will make this change. I will forward as FYI to Dr Melvyn Novas

## 2015-10-05 NOTE — Progress Notes (Signed)
This chart was scribed for [name], MD by Moises Blood, medical scribe at [facility].The patient was seen in exam room 14 and the patient's care was started at 3:09 PM.  Patient ID: Tammy Ball MRN: [id number], DOB: 01-08-1941, 74 y.o. Date of Encounter: 10/05/2015  Primary Physician: [name], MD  Chief Complaint:  Chief Complaint  Patient presents with   Hospitalization Follow-up    HPI:  Tammy Ball is a 74 y.o. female who presents to [facility] for hospitalization follow up for pneumonia.  The pulmonologist (Dr. Melvyn Novas) said that the tumor probably caused the pneumonia. The plan of action is to shrink it down and then do surgery. She plans to go to [facility] for a second opinion.   Dr. Melvyn Novas also suggested that her BP medication should be changed because it might be causing some of her breathing symptoms. She still has some coughs. She denies fever. She's taken Norvasc in the past but had side effects of leg swelling.   She has MRI scheduled for her brain. But she's claustrophobic and she has postnasal drip. So she is asking for a 'tranquilizer' to help her relax.   She is asking for a new PCP as I am planning to retire soon.   She works in a [employer].   Past Medical History  Diagnosis Date   COPD (chronic obstructive pulmonary disease) ([place])    History of kidney cancer    Hypertension    Depression    Asthma    Glaucoma    Hyperlipidemia    Heart murmur    Cancer ([place])    Non-small cell carcinoma of lung, stage 2 ([place]) 10/03/2015     Home Meds: Prior to Admission medications   Medication Sig Start Date End Date Taking? Authorizing Provider  acetaminophen (TYLENOL) 500 MG tablet Take 1,000 mg by mouth every 6 (six) hours as needed for mild pain, moderate pain, fever or headache.   Yes Historical Provider, MD  albuterol (PROVENTIL HFA;VENTOLIN HFA) 108 (90 BASE) MCG/ACT inhaler Inhale 2 puffs into the  lungs every 4 (four) hours as needed for wheezing or shortness of breath. 05/18/15 09/03/16 Yes [name], MD  aspirin EC 81 MG tablet Take 81 mg by mouth daily.   Yes Historical Provider, MD  atorvastatin (LIPITOR) 40 MG tablet TAKE ONE TABLET BY MOUTH ONCE DAILY. Patient taking differently: Take 40 mg by mouth daily.  08/28/15  Yes Chelle Jeffery, PA-C  benazepril (LOTENSIN) 40 MG tablet Take 1 tablet (40 mg total) by mouth daily. 08/29/15  Yes [name], MD  benzonatate (TESSALON) 200 MG capsule Take 1 capsule (200 mg total) by mouth 3 (three) times daily as needed for cough. 09/13/15  Yes Hosie Poisson, MD  CALCIUM PO Take 1 tablet by mouth daily.   Yes Historical Provider, MD  Cholecalciferol (VITAMIN D-3 PO) Take 1 tablet by mouth daily.   Yes Historical Provider, MD  fish oil-omega-3 fatty acids 1000 MG capsule Take 1 g by mouth daily.     Yes Historical Provider, MD  fluticasone (FLONASE) 50 MCG/ACT nasal spray Place 2 sprays into both nostrils daily. 09/13/15  Yes Hosie Poisson, MD  HYDROcodone-acetaminophen (NORCO/VICODIN) 5-325 MG tablet Take 1 tablet by mouth every 6 (six) hours as needed for moderate pain. 09/20/15  Yes Domenic Polite, MD  ipratropium-albuterol (DUONEB) 0.5-2.5 (3) MG/3ML SOLN Take 3 mLs by nebulization 3 (three) times daily. 09/13/15  Yes Hosie Poisson, MD  mometasone-formoterol (DULERA) 200-5 MCG/ACT AERO Inhale 2 puffs into the lungs 2 (two) times daily. 08/29/15  Yes [name], MD  polyvinyl alcohol-povidone (REFRESH) 1.4-0.6 % ophthalmic solution Place 2 drops into both eyes daily as needed (for dry eyes).   Yes Historical Provider, MD  prochlorperazine (COMPAZINE) 10 MG tablet Take 1 tablet (10 mg total) by mouth every 6 (six) hours as needed for nausea or vomiting. 10/03/15  Yes Curt Bears, MD    Allergies:  Allergies  Allergen Reactions   Bee Venom Anaphylaxis, Shortness Of Breath and Swelling    Swelling at site    Levofloxacin Other (See  Comments)    Joint pain    Amlodipine Swelling    Swelling of the ankles and hands    Hctz [Hydrochlorothiazide] Palpitations and Other (See Comments)    Sweating     Social History   Social History   Marital Status: Widowed    Spouse Name: N/A   Number of Children: N/A   Years of Education: N/A   Occupational History   Not on file.   Social History Main Topics   Smoking status: Former Smoker -- 0.50 packs/day for 50 years    Types: Cigarettes    Quit date: 03/16/2011   Smokeless tobacco: Never Used   Alcohol Use: No   Drug Use: No   Sexual Activity: Not Currently   Other Topics Concern   Not on file   Social History Narrative   Occupation: Substance Abuse Counselor   Divorced   College Education   No exercise** Merged History Encounter **       ** Data from: 12/14/11 Enc Dept: [facility]       ** Data from: 12/17/11 Enc Dept: [facility]   Substance abuse counselor   Husband deceased   4 great grandchildren   Son works in same substance abuse counseling center as patient     Review of Systems: Constitutional: negative for fever, chills, night sweats, weight changes, or fatigue  HEENT: negative for vision changes, hearing loss, congestion, rhinorrhea, ST, epistaxis, or sinus pressure; positive for postnasal drip Cardiovascular: negative for chest pain or palpitations Respiratory: negative for hemoptysis, wheezing, shortness of breath; positive for cough Abdominal: negative for abdominal pain, nausea, vomiting, diarrhea, or constipation Dermatological: negative for rash Neurologic: negative for headache, dizziness, or syncope All other systems reviewed and are otherwise negative with the exception to those above and in the HPI.  Physical Exam: Blood pressure 142/80, pulse 104, temperature 99 F (37.2 C), temperature source Oral, resp. rate 17, height '5\' 4"'$  (1.626 m), weight 173 lb (78.472 kg), SpO2 96 %., Body mass index is 29.68  kg/(m^2). General: Well developed, well nourished, in no acute distress. Head: Normocephalic, atraumatic, eyes without discharge, sclera non-icteric, nares are without discharge. Bilateral auditory canals clear, TM's are without perforation, pearly grey and translucent with reflective cone of light bilaterally. Oral cavity moist, posterior pharynx without exudate, erythema, peritonsillar abscess, or post nasal drip.  Neck: Supple. No thyromegaly. Full ROM. No lymphadenopathy. Lungs: Clear bilaterally to auscultation without rales, or rhonchi. Breathing is unlabored.; expiratory wheezing anteriorally  Heart: RRR with S1 S2. No murmurs, rubs, or gallops appreciated. Abdomen: Soft, non-tender, non-distended with normoactive bowel sounds. No hepatomegaly. No rebound/guarding. No obvious abdominal masses. Msk:  Strength and tone normal for age. Extremities/Skin: Warm and dry. No clubbing or cyanosis. No edema. No rashes or suspicious lesions. Neuro: Alert and oriented X 3. Moves all extremities  spontaneously. Gait is normal. CNII-XII grossly in tact. Psych:  Responds to questions appropriately with a normal affect.   BP recheck in room, sitting (left arm): 125/72    ASSESSMENT AND PLAN:  74 y.o. year old female with  This chart was scribed in my presence and reviewed by me personally.    ICD-9-CM ICD-10-CM   1. Malignant neoplasm of lung, unspecified laterality, unspecified part of lung ([place]) 162.9 C34.90   2. Anxiety state 300.00 F41.1 LORazepam (ATIVAN) 0.5 MG tablet  3. Essential hypertension 401.9 I10 valsartan (DIOVAN) 80 MG tablet    Recheck one month   By signing my name below, I, Moises Blood, attest that this documentation has been prepared under the direction and in the presence of [name], MD. Electronically Signed: Moises Blood, Scribe. 10/05/2015 , 3:09 PM .  Signed, [name], MD 10/05/2015 3:09 PM

## 2015-10-07 ENCOUNTER — Other Ambulatory Visit: Payer: Medicare Other

## 2015-10-11 ENCOUNTER — Ambulatory Visit (HOSPITAL_BASED_OUTPATIENT_CLINIC_OR_DEPARTMENT_OTHER): Payer: Medicare Other

## 2015-10-11 ENCOUNTER — Other Ambulatory Visit (HOSPITAL_BASED_OUTPATIENT_CLINIC_OR_DEPARTMENT_OTHER): Payer: Medicare Other

## 2015-10-11 VITALS — BP 141/76 | HR 84 | Temp 98.2°F | Resp 18

## 2015-10-11 DIAGNOSIS — C3411 Malignant neoplasm of upper lobe, right bronchus or lung: Secondary | ICD-10-CM | POA: Diagnosis present

## 2015-10-11 DIAGNOSIS — C3491 Malignant neoplasm of unspecified part of right bronchus or lung: Secondary | ICD-10-CM

## 2015-10-11 DIAGNOSIS — Z5111 Encounter for antineoplastic chemotherapy: Secondary | ICD-10-CM | POA: Diagnosis present

## 2015-10-11 LAB — COMPREHENSIVE METABOLIC PANEL (CC13)
ALT: 16 U/L (ref 0–55)
AST: 17 U/L (ref 5–34)
Albumin: 3.9 g/dL (ref 3.5–5.0)
Alkaline Phosphatase: 77 U/L (ref 40–150)
Anion Gap: 9 mEq/L (ref 3–11)
BUN: 21.8 mg/dL (ref 7.0–26.0)
CO2: 25 mEq/L (ref 22–29)
Calcium: 10.4 mg/dL (ref 8.4–10.4)
Chloride: 107 mEq/L (ref 98–109)
Creatinine: 1.1 mg/dL (ref 0.6–1.1)
EGFR: 49 mL/min/{1.73_m2} — ABNORMAL LOW (ref >90)
Glucose: 107 mg/dl (ref 70–140)
Potassium: 4.7 mEq/L (ref 3.5–5.1)
Sodium: 140 mEq/L (ref 136–145)
Total Bilirubin: 0.4 mg/dL (ref 0.20–1.20)
Total Protein: 7.2 g/dL (ref 6.4–8.3)

## 2015-10-11 LAB — CBC WITH DIFFERENTIAL/PLATELET
BASO%: 1.4 % (ref 0.0–2.0)
Basophils Absolute: 0.1 10*3/uL (ref 0.0–0.1)
EOS%: 2.3 % (ref 0.0–7.0)
Eosinophils Absolute: 0.2 10*3/uL (ref 0.0–0.5)
HCT: 41.2 % (ref 34.8–46.6)
HGB: 13.3 g/dL (ref 11.6–15.9)
LYMPH%: 32.6 % (ref 14.0–49.7)
MCH: 29.7 pg (ref 25.1–34.0)
MCHC: 32.4 g/dL (ref 31.5–36.0)
MCV: 91.7 fL (ref 79.5–101.0)
MONO#: 0.7 10*3/uL (ref 0.1–0.9)
MONO%: 9.9 % (ref 0.0–14.0)
NEUT#: 3.8 10*3/uL (ref 1.5–6.5)
NEUT%: 53.8 % (ref 38.4–76.8)
Platelets: 303 10*3/uL (ref 145–400)
RBC: 4.49 10*6/uL (ref 3.70–5.45)
RDW: 16.7 % — ABNORMAL HIGH (ref 11.2–14.5)
WBC: 7.1 10*3/uL (ref 3.9–10.3)
lymph#: 2.3 10*3/uL (ref 0.9–3.3)

## 2015-10-11 MED ORDER — SODIUM CHLORIDE 0.9 % IV SOLN
Freq: Once | INTRAVENOUS | Status: AC
  Administered 2015-10-11: 09:00:00 via INTRAVENOUS
  Filled 2015-10-11: qty 8

## 2015-10-11 MED ORDER — FAMOTIDINE IN NACL 20-0.9 MG/50ML-% IV SOLN
INTRAVENOUS | Status: AC
  Filled 2015-10-11: qty 50

## 2015-10-11 MED ORDER — PACLITAXEL CHEMO INJECTION 300 MG/50ML
200.0000 mg/m2 | Freq: Once | INTRAVENOUS | Status: AC
  Administered 2015-10-11: 384 mg via INTRAVENOUS
  Filled 2015-10-11: qty 64

## 2015-10-11 MED ORDER — DIPHENHYDRAMINE HCL 50 MG/ML IJ SOLN
50.0000 mg | Freq: Once | INTRAMUSCULAR | Status: AC
  Administered 2015-10-11: 50 mg via INTRAVENOUS

## 2015-10-11 MED ORDER — FAMOTIDINE IN NACL 20-0.9 MG/50ML-% IV SOLN
20.0000 mg | Freq: Once | INTRAVENOUS | Status: AC
  Administered 2015-10-11: 20 mg via INTRAVENOUS

## 2015-10-11 MED ORDER — DIPHENHYDRAMINE HCL 50 MG/ML IJ SOLN
INTRAMUSCULAR | Status: AC
  Filled 2015-10-11: qty 1

## 2015-10-11 MED ORDER — CARBOPLATIN CHEMO INJECTION 600 MG/60ML
447.6000 mg | Freq: Once | INTRAVENOUS | Status: AC
  Administered 2015-10-11: 450 mg via INTRAVENOUS
  Filled 2015-10-11: qty 45

## 2015-10-11 MED ORDER — SODIUM CHLORIDE 0.9 % IV SOLN
Freq: Once | INTRAVENOUS | Status: AC
  Administered 2015-10-11: 09:00:00 via INTRAVENOUS

## 2015-10-11 NOTE — Patient Instructions (Signed)
[facility] Discharge Instructions for Patients Receiving Chemotherapy  Today you received the following chemotherapy agents Taxol and Carboplatin.  To help prevent nausea and vomiting after your treatment, we encourage you to take your nausea medication as directed.   If you develop nausea and vomiting that is not controlled by your nausea medication, call the clinic.   BELOW ARE SYMPTOMS THAT SHOULD BE REPORTED IMMEDIATELY:  *FEVER GREATER THAN 100.5 F  *CHILLS WITH OR WITHOUT FEVER  NAUSEA AND VOMITING THAT IS NOT CONTROLLED WITH YOUR NAUSEA MEDICATION  *UNUSUAL SHORTNESS OF BREATH  *UNUSUAL BRUISING OR BLEEDING  TENDERNESS IN MOUTH AND THROAT WITH OR WITHOUT PRESENCE OF ULCERS  *URINARY PROBLEMS  *BOWEL PROBLEMS  UNUSUAL RASH Items with * indicate a potential emergency and should be followed up as soon as possible.  Feel free to call the clinic you have any questions or concerns. The clinic phone number is [phone].  Please show the [facility] at check-in to the Emergency Department and triage nurse.  Paclitaxel injection What is this medicine? PACLITAXEL (PAK li TAX el) is a chemotherapy drug. It targets fast dividing cells, like cancer cells, and causes these cells to die. This medicine is used to treat ovarian cancer, breast cancer, and other cancers. This medicine may be used for other purposes; ask your health care provider or pharmacist if you have questions. What should I tell my health care provider before I take this medicine? They need to know if you have any of these conditions: -blood disorders -irregular heartbeat -infection (especially a virus infection such as chickenpox, cold sores, or herpes) -liver disease -previous or ongoing radiation therapy -an unusual or allergic reaction to paclitaxel, alcohol, polyoxyethylated castor oil, other chemotherapy agents, other medicines, foods, dyes, or preservatives -pregnant or trying to  get pregnant -breast-feeding How should I use this medicine? This drug is given as an infusion into a vein. It is administered in a hospital or clinic by a specially trained health care professional. Talk to your pediatrician regarding the use of this medicine in children. Special care may be needed. Overdosage: If you think you have taken too much of this medicine contact a poison control center or emergency room at once. NOTE: This medicine is only for you. Do not share this medicine with others. What if I miss a dose? It is important not to miss your dose. Call your doctor or health care professional if you are unable to keep an appointment. What may interact with this medicine? Do not take this medicine with any of the following medications: -disulfiram -metronidazole This medicine may also interact with the following medications: -cyclosporine -diazepam -ketoconazole -medicines to increase blood counts like filgrastim, pegfilgrastim, sargramostim -other chemotherapy drugs like cisplatin, doxorubicin, epirubicin, etoposide, teniposide, vincristine -quinidine -testosterone -vaccines -verapamil Talk to your doctor or health care professional before taking any of these medicines: -acetaminophen -aspirin -ibuprofen -ketoprofen -naproxen This list may not describe all possible interactions. Give your health care provider a list of all the medicines, herbs, non-prescription drugs, or dietary supplements you use. Also tell them if you smoke, drink alcohol, or use illegal drugs. Some items may interact with your medicine. What should I watch for while using this medicine? Your condition will be monitored carefully while you are receiving this medicine. You will need important blood work done while you are taking this medicine. This drug may make you feel generally unwell. This is not uncommon, as chemotherapy can affect healthy cells as well  as cancer cells. Report any side effects.  Continue your course of treatment even though you feel ill unless your doctor tells you to stop. This medicine can cause serious allergic reactions. To reduce your risk you will need to take other medicine(s) before treatment with this medicine. In some cases, you may be given additional medicines to help with side effects. Follow all directions for their use. Call your doctor or health care professional for advice if you get a fever, chills or sore throat, or other symptoms of a cold or flu. Do not treat yourself. This drug decreases your body's ability to fight infections. Try to avoid being around people who are sick. This medicine may increase your risk to bruise or bleed. Call your doctor or health care professional if you notice any unusual bleeding. Be careful brushing and flossing your teeth or using a toothpick because you may get an infection or bleed more easily. If you have any dental work done, tell your dentist you are receiving this medicine. Avoid taking products that contain aspirin, acetaminophen, ibuprofen, naproxen, or ketoprofen unless instructed by your doctor. These medicines may hide a fever. Do not become pregnant while taking this medicine. Women should inform their doctor if they wish to become pregnant or think they might be pregnant. There is a potential for serious side effects to an unborn child. Talk to your health care professional or pharmacist for more information. Do not breast-feed an infant while taking this medicine. Men are advised not to father a child while receiving this medicine. This product may contain alcohol. Ask your pharmacist or healthcare provider if this medicine contains alcohol. Be sure to tell all healthcare providers you are taking this medicine. Certain medicines, like metronidazole and disulfiram, can cause an unpleasant reaction when taken with alcohol. The reaction includes flushing, headache, nausea, vomiting, sweating, and increased thirst. The  reaction can last from 30 minutes to several hours. What side effects may I notice from receiving this medicine? Side effects that you should report to your doctor or health care professional as soon as possible: -allergic reactions like skin rash, itching or hives, swelling of the face, lips, or tongue -low blood counts - This drug may decrease the number of white blood cells, red blood cells and platelets. You may be at increased risk for infections and bleeding. -signs of infection - fever or chills, cough, sore throat, pain or difficulty passing urine -signs of decreased platelets or bleeding - bruising, pinpoint red spots on the skin, black, tarry stools, nosebleeds -signs of decreased red blood cells - unusually weak or tired, fainting spells, lightheadedness -breathing problems -chest pain -high or low blood pressure -mouth sores -nausea and vomiting -pain, swelling, redness or irritation at the injection site -pain, tingling, numbness in the hands or feet -slow or irregular heartbeat -swelling of the ankle, feet, hands Side effects that usually do not require medical attention (report to your doctor or health care professional if they continue or are bothersome): -bone pain -complete hair loss including hair on your head, underarms, pubic hair, eyebrows, and eyelashes -changes in the color of fingernails -diarrhea -loosening of the fingernails -loss of appetite -muscle or joint pain -red flush to skin -sweating This list may not describe all possible side effects. Call your doctor for medical advice about side effects. You may report side effects to FDA at 1-800-FDA-1088. Where should I keep my medicine? This drug is given in a hospital or clinic and will not be stored at  home. NOTE: This sheet is a summary. It may not cover all possible information. If you have questions about this medicine, talk to your doctor, pharmacist, or health care provider.    2016, Elsevier/Gold  Standard. (2015-06-16 13:02:56)  Carboplatin injection What is this medicine? CARBOPLATIN (KAR boe pla tin) is a chemotherapy drug. It targets fast dividing cells, like cancer cells, and causes these cells to die. This medicine is used to treat ovarian cancer and many other cancers. This medicine may be used for other purposes; ask your health care provider or pharmacist if you have questions. What should I tell my health care provider before I take this medicine? They need to know if you have any of these conditions: -blood disorders -hearing problems -kidney disease -recent or ongoing radiation therapy -an unusual or allergic reaction to carboplatin, cisplatin, other chemotherapy, other medicines, foods, dyes, or preservatives -pregnant or trying to get pregnant -breast-feeding How should I use this medicine? This drug is usually given as an infusion into a vein. It is administered in a hospital or clinic by a specially trained health care professional. Talk to your pediatrician regarding the use of this medicine in children. Special care may be needed. Overdosage: If you think you have taken too much of this medicine contact a poison control center or emergency room at once. NOTE: This medicine is only for you. Do not share this medicine with others. What if I miss a dose? It is important not to miss a dose. Call your doctor or health care professional if you are unable to keep an appointment. What may interact with this medicine? -medicines for seizures -medicines to increase blood counts like filgrastim, pegfilgrastim, sargramostim -some antibiotics like amikacin, gentamicin, neomycin, streptomycin, tobramycin -vaccines Talk to your doctor or health care professional before taking any of these medicines: -acetaminophen -aspirin -ibuprofen -ketoprofen -naproxen This list may not describe all possible interactions. Give your health care provider a list of all the medicines, herbs,  non-prescription drugs, or dietary supplements you use. Also tell them if you smoke, drink alcohol, or use illegal drugs. Some items may interact with your medicine. What should I watch for while using this medicine? Your condition will be monitored carefully while you are receiving this medicine. You will need important blood work done while you are taking this medicine. This drug may make you feel generally unwell. This is not uncommon, as chemotherapy can affect healthy cells as well as cancer cells. Report any side effects. Continue your course of treatment even though you feel ill unless your doctor tells you to stop. In some cases, you may be given additional medicines to help with side effects. Follow all directions for their use. Call your doctor or health care professional for advice if you get a fever, chills or sore throat, or other symptoms of a cold or flu. Do not treat yourself. This drug decreases your body's ability to fight infections. Try to avoid being around people who are sick. This medicine may increase your risk to bruise or bleed. Call your doctor or health care professional if you notice any unusual bleeding. Be careful brushing and flossing your teeth or using a toothpick because you may get an infection or bleed more easily. If you have any dental work done, tell your dentist you are receiving this medicine. Avoid taking products that contain aspirin, acetaminophen, ibuprofen, naproxen, or ketoprofen unless instructed by your doctor. These medicines may hide a fever. Do not become pregnant while taking this medicine.  Women should inform their doctor if they wish to become pregnant or think they might be pregnant. There is a potential for serious side effects to an unborn child. Talk to your health care professional or pharmacist for more information. Do not breast-feed an infant while taking this medicine. What side effects may I notice from receiving this medicine? Side effects  that you should report to your doctor or health care professional as soon as possible: -allergic reactions like skin rash, itching or hives, swelling of the face, lips, or tongue -signs of infection - fever or chills, cough, sore throat, pain or difficulty passing urine -signs of decreased platelets or bleeding - bruising, pinpoint red spots on the skin, black, tarry stools, nosebleeds -signs of decreased red blood cells - unusually weak or tired, fainting spells, lightheadedness -breathing problems -changes in hearing -changes in vision -chest pain -high blood pressure -low blood counts - This drug may decrease the number of white blood cells, red blood cells and platelets. You may be at increased risk for infections and bleeding. -nausea and vomiting -pain, swelling, redness or irritation at the injection site -pain, tingling, numbness in the hands or feet -problems with balance, talking, walking -trouble passing urine or change in the amount of urine Side effects that usually do not require medical attention (report to your doctor or health care professional if they continue or are bothersome): -hair loss -loss of appetite -metallic taste in the mouth or changes in taste This list may not describe all possible side effects. Call your doctor for medical advice about side effects. You may report side effects to FDA at 1-800-FDA-1088. Where should I keep my medicine? This drug is given in a hospital or clinic and will not be stored at home. NOTE: This sheet is a summary. It may not cover all possible information. If you have questions about this medicine, talk to your doctor, pharmacist, or health care provider.    2016, Elsevier/Gold Standard. (2008-02-03 14:38:05)

## 2015-10-12 ENCOUNTER — Telehealth: Payer: Self-pay | Admitting: *Deleted

## 2015-10-12 NOTE — Telephone Encounter (Signed)
Triage pager went off "requesting call to [phone] ASAP.  Having reaction to chemotherapy." Called patient who reports 'redness to face at bridge of nose and forehead with slight itching.  My mouth feels fine with no swelling or trouble swallowing and no other symptoms.  What do I need to do?"  This nurse advised this is a normal side effect of the dexamethasone 20 mg she received yesterday.  It should resolve in the next three days.  OTC Benadryl and Pepcid may help.  Will notify Dr. Julien Nordmann for any instructions or orders.

## 2015-10-13 ENCOUNTER — Ambulatory Visit (HOSPITAL_BASED_OUTPATIENT_CLINIC_OR_DEPARTMENT_OTHER): Payer: Medicare Other

## 2015-10-13 VITALS — BP 167/80 | HR 83 | Temp 98.2°F | Resp 20

## 2015-10-13 DIAGNOSIS — C3491 Malignant neoplasm of unspecified part of right bronchus or lung: Secondary | ICD-10-CM | POA: Diagnosis present

## 2015-10-13 DIAGNOSIS — Z5189 Encounter for other specified aftercare: Secondary | ICD-10-CM

## 2015-10-13 MED ORDER — PEGFILGRASTIM INJECTION 6 MG/0.6ML ~~LOC~~
6.0000 mg | PREFILLED_SYRINGE | Freq: Once | SUBCUTANEOUS | Status: AC
  Administered 2015-10-13: 6 mg via SUBCUTANEOUS
  Filled 2015-10-13: qty 0.6

## 2015-10-13 NOTE — Patient Instructions (Signed)
Pegfilgrastim injection What is this medicine? PEGFILGRASTIM (PEG fil gra stim) is a long-acting granulocyte colony-stimulating factor that stimulates the growth of neutrophils, a type of white blood cell important in the body's fight against infection. It is used to reduce the incidence of fever and infection in patients with certain types of cancer who are receiving chemotherapy that affects the bone marrow, and to increase survival after being exposed to high doses of radiation. This medicine may be used for other purposes; ask your health care provider or pharmacist if you have questions. What should I tell my health care provider before I take this medicine? They need to know if you have any of these conditions: -kidney disease -latex allergy -ongoing radiation therapy -sickle cell disease -skin reactions to acrylic adhesives (On-Body Injector only) -an unusual or allergic reaction to pegfilgrastim, filgrastim, other medicines, foods, dyes, or preservatives -pregnant or trying to get pregnant -breast-feeding How should I use this medicine? This medicine is for injection under the skin. If you get this medicine at home, you will be taught how to prepare and give the pre-filled syringe or how to use the On-body Injector. Refer to the patient Instructions for Use for detailed instructions. Use exactly as directed. Take your medicine at regular intervals. Do not take your medicine more often than directed. It is important that you put your used needles and syringes in a special sharps container. Do not put them in a trash can. If you do not have a sharps container, call your pharmacist or healthcare provider to get one. Talk to your pediatrician regarding the use of this medicine in children. While this drug may be prescribed for selected conditions, precautions do apply. Overdosage: If you think you have taken too much of this medicine contact a poison control center or emergency room at  once. NOTE: This medicine is only for you. Do not share this medicine with others. What if I miss a dose? It is important not to miss your dose. Call your doctor or health care professional if you miss your dose. If you miss a dose due to an On-body Injector failure or leakage, a new dose should be administered as soon as possible using a single prefilled syringe for manual use. What may interact with this medicine? Interactions have not been studied. Give your health care provider a list of all the medicines, herbs, non-prescription drugs, or dietary supplements you use. Also tell them if you smoke, drink alcohol, or use illegal drugs. Some items may interact with your medicine. This list may not describe all possible interactions. Give your health care provider a list of all the medicines, herbs, non-prescription drugs, or dietary supplements you use. Also tell them if you smoke, drink alcohol, or use illegal drugs. Some items may interact with your medicine. What should I watch for while using this medicine? You may need blood work done while you are taking this medicine. If you are going to need a MRI, CT scan, or other procedure, tell your doctor that you are using this medicine (On-Body Injector only). What side effects may I notice from receiving this medicine? Side effects that you should report to your doctor or health care professional as soon as possible: -allergic reactions like skin rash, itching or hives, swelling of the face, lips, or tongue -dizziness -fever -pain, redness, or irritation at site where injected -pinpoint red spots on the skin -red or dark-brown urine -shortness of breath or breathing problems -stomach or side pain, or pain   at the shoulder -swelling -tiredness -trouble passing urine or change in the amount of urine Side effects that usually do not require medical attention (report to your doctor or health care professional if they continue or are  bothersome): -bone pain -muscle pain This list may not describe all possible side effects. Call your doctor for medical advice about side effects. You may report side effects to FDA at 1-800-FDA-1088. Where should I keep my medicine? Keep out of the reach of children. Store pre-filled syringes in a refrigerator between 2 and 8 degrees C (36 and 46 degrees F). Do not freeze. Keep in carton to protect from light. Throw away this medicine if it is left out of the refrigerator for more than 48 hours. Throw away any unused medicine after the expiration date. NOTE: This sheet is a summary. It may not cover all possible information. If you have questions about this medicine, talk to your doctor, pharmacist, or health care provider.    2016, Elsevier/Gold Standard. (2014-11-18 14:30:14)  

## 2015-10-14 ENCOUNTER — Telehealth: Payer: Self-pay | Admitting: Medical Oncology

## 2015-10-14 NOTE — Telephone Encounter (Signed)
I left a message for pt to return chemo followup call.

## 2015-10-17 ENCOUNTER — Telehealth: Payer: Self-pay | Admitting: *Deleted

## 2015-10-17 ENCOUNTER — Other Ambulatory Visit: Payer: Medicare Other

## 2015-10-17 ENCOUNTER — Ambulatory Visit: Payer: Medicare Other | Admitting: Physician Assistant

## 2015-10-17 LAB — FUNGUS CULTURE W SMEAR
Fungal Smear: NONE SEEN
Special Requests: NORMAL

## 2015-10-17 MED ORDER — OXYCODONE-ACETAMINOPHEN 5-325 MG PO TABS: 1.0000 | ORAL_TABLET | Freq: Four times a day (QID) | ORAL | Status: DC | PRN

## 2015-10-17 NOTE — Telephone Encounter (Signed)
10:05am: Pt son reported to [facility] and filled out a walk in form. "Mom is in Pain". This nurse met with son in front lobby. Pt is not present at this time. Peripheral Neuropathy is terrible, pain medication is not helping- after taking hydrocodone reported pain is a 7/10. Pt had Neulasta injection last week and was not aware to take Claritin. Tylenol does not help pain at all.  Pt is going to be added to [facility] schedule for today upon her return for pain management.  10:46am: Spoke to Selena Lesser, NP who advised pt should start Claritin now and prescribed a stronger pain medication. TC to son's cell phone to advise change in plan. Pt will try Claritin and new pain medication and return to [facility] tomorrow if pain is not controlled. Son agrees with plan.  Prescription for percocet printed/signed and put up front for pt son to pick up.

## 2015-10-17 NOTE — Telephone Encounter (Signed)
Patient called stating that she is in excruciating bone pain possibly from her neulasta injection. MD [facility] notified and recommended for the patient to take claritin and tylenol. Patient informed and instructed her to inform her son who was currently at the cancer center. After discussion with Jethro Bolus LPN, symptom management will be seeing her due to neuropathy/bone pain symptoms.

## 2015-10-18 ENCOUNTER — Other Ambulatory Visit: Payer: Medicare Other

## 2015-10-18 ENCOUNTER — Other Ambulatory Visit: Payer: Self-pay | Admitting: Physician Assistant

## 2015-10-18 ENCOUNTER — Other Ambulatory Visit (HOSPITAL_BASED_OUTPATIENT_CLINIC_OR_DEPARTMENT_OTHER): Payer: Medicare Other

## 2015-10-18 ENCOUNTER — Telehealth: Payer: Self-pay | Admitting: Physician Assistant

## 2015-10-18 ENCOUNTER — Ambulatory Visit (HOSPITAL_BASED_OUTPATIENT_CLINIC_OR_DEPARTMENT_OTHER): Payer: Medicare Other | Admitting: Physician Assistant

## 2015-10-18 VITALS — BP 109/61 | HR 101 | Temp 98.3°F | Resp 17 | Ht 64.0 in | Wt 174.0 lb

## 2015-10-18 DIAGNOSIS — C3491 Malignant neoplasm of unspecified part of right bronchus or lung: Secondary | ICD-10-CM

## 2015-10-18 DIAGNOSIS — M2550 Pain in unspecified joint: Secondary | ICD-10-CM

## 2015-10-18 DIAGNOSIS — G622 Polyneuropathy due to other toxic agents: Secondary | ICD-10-CM | POA: Diagnosis not present

## 2015-10-18 DIAGNOSIS — C3411 Malignant neoplasm of upper lobe, right bronchus or lung: Secondary | ICD-10-CM | POA: Diagnosis not present

## 2015-10-18 DIAGNOSIS — R110 Nausea: Secondary | ICD-10-CM

## 2015-10-18 DIAGNOSIS — N899 Noninflammatory disorder of vagina, unspecified: Secondary | ICD-10-CM

## 2015-10-18 DIAGNOSIS — R39198 Other difficulties with micturition: Secondary | ICD-10-CM | POA: Diagnosis not present

## 2015-10-18 LAB — CBC WITH DIFFERENTIAL/PLATELET
BASO%: 1.1 % (ref 0.0–2.0)
Basophils Absolute: 0.1 10*3/uL (ref 0.0–0.1)
EOS%: 3.6 % (ref 0.0–7.0)
Eosinophils Absolute: 0.3 10*3/uL (ref 0.0–0.5)
HCT: 38.7 % (ref 34.8–46.6)
HGB: 12.9 g/dL (ref 11.6–15.9)
LYMPH%: 22.5 % (ref 14.0–49.7)
MCH: 30.6 pg (ref 25.1–34.0)
MCHC: 33.3 g/dL (ref 31.5–36.0)
MCV: 91.7 fL (ref 79.5–101.0)
MONO#: 1.4 10*3/uL — ABNORMAL HIGH (ref 0.1–0.9)
MONO%: 16.4 % — ABNORMAL HIGH (ref 0.0–14.0)
NEUT#: 5 10*3/uL (ref 1.5–6.5)
NEUT%: 56.4 % (ref 38.4–76.8)
Platelets: 228 10*3/uL (ref 145–400)
RBC: 4.22 10*6/uL (ref 3.70–5.45)
RDW: 16.3 % — ABNORMAL HIGH (ref 11.2–14.5)
WBC: 8.8 10*3/uL (ref 3.9–10.3)
lymph#: 2 10*3/uL (ref 0.9–3.3)

## 2015-10-18 LAB — URINALYSIS, MICROSCOPIC - CHCC
Bilirubin (Urine): NEGATIVE
Blood: NEGATIVE
Glucose: NEGATIVE mg/dL
Ketones: NEGATIVE mg/dL
Leukocyte Esterase: NEGATIVE
Nitrite: NEGATIVE
Protein: NEGATIVE mg/dL
Specific Gravity, Urine: 1.01 (ref 1.003–1.035)
Urobilinogen, UR: 0.2 mg/dL (ref 0.2–1)
pH: 5 (ref 4.6–8.0)

## 2015-10-18 LAB — COMPREHENSIVE METABOLIC PANEL
ALT: 24 U/L (ref 0–55)
AST: 20 U/L (ref 5–34)
Albumin: 4.1 g/dL (ref 3.5–5.0)
Alkaline Phosphatase: 120 U/L (ref 40–150)
Anion Gap: 9 mEq/L (ref 3–11)
BUN: 32.7 mg/dL — ABNORMAL HIGH (ref 7.0–26.0)
CO2: 25 mEq/L (ref 22–29)
Calcium: 10.4 mg/dL (ref 8.4–10.4)
Chloride: 102 mEq/L (ref 98–109)
Creatinine: 1.5 mg/dL — ABNORMAL HIGH (ref 0.6–1.1)
EGFR: 33 mL/min/{1.73_m2} — ABNORMAL LOW (ref >90)
Glucose: 103 mg/dl (ref 70–140)
Potassium: 5.6 mEq/L — ABNORMAL HIGH (ref 3.5–5.1)
Sodium: 137 mEq/L (ref 136–145)
Total Bilirubin: 0.37 mg/dL (ref 0.20–1.20)
Total Protein: 7.4 g/dL (ref 6.4–8.3)

## 2015-10-18 NOTE — Progress Notes (Signed)
Hematology and Oncology Follow Up Visit  Tammy Ball [id number] 1941-05-04 74 y.o. 10/18/2015 9:28 AM   Interim History:  Tammy Ball is here for a follow up visit. She has tolerated her cycle 1 of chemotherapy well, with the exception of some neuropathy and bone pain after her Neulasta injection.  She denies any fever, chills or night sweats. He denies any vision changes or mucositis. She denies any respiratory Or cardiac complaints. Denies lower extremity swelling. Denies nausea, heartburn or change in bowel habits. Appetite is normal. She reports "chemotherapy odor" in her urine, and has been experiencing possible yeast infection, for which she is to use vaginal antifungal cream. Denies other abnormal skin rashes, or neuropathy. Denies any bleeding issues such as epistaxis, hematemesis, hematuria or hematochezia. Ambulating without difficulty.    Medications:   Medications have been reviewed.  Allergies:  Allergies  Allergen Reactions  . Bee Venom Anaphylaxis, Shortness Of Breath and Swelling    Swelling at site   . Levofloxacin Other (See Comments)    Joint pain   . Amlodipine Swelling    Swelling of the ankles and hands   . Hctz [Hydrochlorothiazide] Palpitations and Other (See Comments)    Sweating     Past Medical History, Surgical history, Social history, and Family History were reviewed and updated.  Review of Systems: Review of Systems  Constitutional: Negative.   HENT: Negative.   Eyes: Negative.   Respiratory: Negative.   Cardiovascular: Negative.   Gastrointestinal: Positive for constipation.  Genitourinary: Negative for dysuria and urgency.       She reports vaginal itching likely due to yeast infection  Musculoskeletal: Positive for joint pain.  Neurological: Negative.   Endo/Heme/Allergies: Negative.   Psychiatric/Behavioral: The patient has insomnia.     Remaining ROS negative.  Physical Exam: Blood pressure 109/61, pulse 101, temperature 98.3 F (36.8  C), temperature source Oral, resp. rate 17, height '5\' 4"'$  (1.626 m), weight 174 lb (78.926 kg), SpO2 95 %.  Physical Exam  LABS: CBC Latest Ref Rng 10/18/2015 10/11/2015 10/03/2015  WBC 3.9 - 10.3 10e3/uL 8.8 7.1 7.1  Hemoglobin 11.6 - 15.9 g/dL 12.9 13.3 12.8  Hematocrit 34.8 - 46.6 % 38.7 41.2 40.2  Platelets 145 - 400 10e3/uL 228 303 341   CMP Latest Ref Rng 10/11/2015 10/03/2015 09/20/2015  Glucose 70 - 140 mg/dl 107 98 104(H)  BUN 7.0 - 26.0 mg/dL 21.8 24.4 18  Creatinine 0.6 - 1.1 mg/dL 1.1 1.3(H) 1.04(H)  Sodium 136 - 145 mEq/L 140 139 138  Potassium 3.5 - 5.1 mEq/L 4.7 5.5(H) 4.4  Chloride 101 - 111 mmol/L - - 101  CO2 22 - 29 mEq/L '25 26 31  '$ Calcium 8.4 - 10.4 mg/dL 10.4 10.4 9.5  Total Protein 6.4 - 8.3 g/dL 7.2 7.4 -  Total Bilirubin 0.20 - 1.20 mg/dL 0.40 0.56 -  Alkaline Phos 40 - 150 U/L 77 81 -  AST 5 - 34 U/L 17 20 -  ALT 0 - 55 U/L 16 18 -    Radiological Studies: Dg Chest 2 View  09/19/2015  CLINICAL DATA:  Chest pain and shortness of breath EXAM: CHEST  2 VIEW COMPARISON:  September 12, 2015 and September 08, 2015 FINDINGS: There is airspace consolidation in the right upper lobe medially, overlying the hilum on the lateral view. There is persistent underlying interstitial prominence. There is patchy atelectasis in the lung bases with small pleural effusions bilaterally. Heart size and pulmonary vascularity are normal. No adenopathy. No bone  lesions are appreciable. There is postoperative change in the lower cervical spine. IMPRESSION: There is airspace consolidation in the right upper lobe medially. There is patchy bibasilar atelectasis with small pleural effusions. There is underlying interstitial prominence. There may well be a degree of underlying fibrotic type change. No change in cardiac silhouette. Followup PA and lateral chest radiographs recommended in 3-4 weeks following trial of antibiotic therapy to ensure resolution and exclude underlying malignancy.  Electronically Signed   By: Lowella Grip III M.D.   On: 09/19/2015 07:58   Ct Angio Chest Pe W/cm &/or Wo Cm  09/19/2015  CLINICAL DATA:  Chest pain with difficulty breathing. History of renal cell carcinoma EXAM: CT ANGIOGRAPHY CHEST WITH CONTRAST TECHNIQUE: Multidetector CT imaging of the chest was performed using the standard protocol during bolus administration of intravenous contrast. Multiplanar CT image reconstructions and MIPs were obtained to evaluate the vascular anatomy. CONTRAST:  79m OMNIPAQUE IOHEXOL 350 MG/ML SOLN COMPARISON:  Chest radiograph September 19, 2015 FINDINGS: There is no demonstrable pulmonary embolus. There is no thoracic aortic aneurysm or dissection. There is atherosclerotic change in the aorta. There are scattered foci of atherosclerotic calcification in the visualized great vessels. : There is a small right pleural effusion. There is patchy airspace consolidation in both lung bases. In the right hilar region, there is irregular opacity with partial narrowing of the right upper lobe bronchus. This area of irregular opacity measures 4.5 x 3.6 cm. Elsewhere, there is mild scarring in the apices with bullous disease in the right apex. Thyroid appears unremarkable. Beyond apparent adenopathy in the right hilar region, no other adenopathy is appreciable. There is borderline pericardial thickening with questionable small pericardial effusion. In the visualized upper abdomen, no lesions are appreciable beyond atherosclerotic calcification in the aorta. There are no blastic or lytic bone lesions. Review of the MIP images confirms the above findings. IMPRESSION: No demonstrable pulmonary embolus. Irregular opacity in the medial right upper lobe extending to and possibly arising from the right hilum. The appearance in this area is concerning for neoplasia with surrounding pneumonitis. Note that there is narrowing of a portion of the right upper lobe bronchus without obstruction of this  bronchus. There is a small right effusion with patchy airspace consolidation in both lung bases. There is no adenopathy beyond what is felt to represent lymph node prominence in the right hilum. Question small right pericardial effusion. Scattered areas of atherosclerotic change. With respect to the lesion in the right hilum/right upper lobe, bronchoscopy may be helpful for further evaluation. Alternatively, PET-CT may be helpful, particularly given history of previous renal cell carcinoma. Electronically Signed   By: WLowella GripIII M.D.   On: 09/19/2015 10:45   Nm Pet Image Initial (pi) Skull Base To Thigh  09/29/2015  CLINICAL DATA:  Initial treatment strategy for right lung squamous cell carcinoma. EXAM: NUCLEAR MEDICINE PET SKULL BASE TO THIGH TECHNIQUE: 10.4 mCi F-18 FDG was injected intravenously. Full-ring PET imaging was performed from the skull base to thigh after the radiotracer. CT data was obtained and used for attenuation correction and anatomic localization. FASTING BLOOD GLUCOSE:  Value: 100 mg/dl COMPARISON:  Chest CT on 09/19/2015 FINDINGS: NECK No hypermetabolic lymph nodes in the neck. CHEST 4 cm mass with areas of cavitation in the central right upper lobe is hypermetabolic, with SUV max of 24.6. This is contiguous with the right hilum. No hypermetabolic mediastinal or left hilar lymph nodes are seen. Mild emphysema noted.  No evidence of pleural effusion.  ABDOMEN/PELVIS No abnormal hypermetabolic activity within the liver, pancreas, adrenal glands, or spleen. No hypermetabolic lymph nodes in the abdomen or pelvis. Left kidney is absent. SKELETON No focal hypermetabolic activity to suggest skeletal metastasis. IMPRESSION: 4 cm hypermetabolic central right upper lobe mass, consistent with primary bronchogenic carcinoma. This is contiguous with the right hilum. No other thoracic nodal or distant metastatic disease identified. Emphysema.  Absence of left kidney also noted. Electronically  Signed   By: Earle Gell M.D.   On: 09/29/2015 16:44    Impression and Plan: ASSESSMENT: This is a very pleasant 74 years old white female recently diagnosed with a stage IIa (T2a, N1, M0) non-small cell lung cancer, squamous cell carcinoma presented with large right upper lobe lung mass with right hilar involvement diagnosed in November 2016.   PLAN We discussed the patient his case at the weekly thoracic conference and it was felt that the patient may benefit from a course of neoadjuvant systemic chemotherapy before consideration of surgical resection. After completion of the neoadjuvant systemic chemotherapy, the patient would have repeat imaging study and if she is not a surgical candidate, she would be treated with a course of concurrent chemoradiation. She began a course neoadjuvant systemic chemotherapy with carboplatin for AUC of 6 and paclitaxel 200 MG/M2 every 3 weeks with Neulasta support for a total of 3 cycles. I discussed with the patient adverse effect of this treatment including but not limited to alopecia, myelosuppression, nausea and vomiting, peripheral neuropathy, liver or renal dysfunction. She received her first dose on 10/11/2015.She will receive her cycle 2 on 11/01/2015 as planned and will return prior to her dose She is scheduled to have an MRI of the brain on 10/21/2015 For joint or bone pain, she is to continue on Claritin as well as Percocet. She continues to have neuropathy, we will proceed with dose adjustmentI  For intermittent nausea, she will continue to take Compazine 10 mg by mouth every 6 hours as needed for nausea. Of note, a urinalysis has been done on her, to rule out any potential UTI, with nitrites being negative. She is to treat her vaginal yeast infection with over-the-counter products  The patient was advised to call immediately if she has any concerning symptoms in the interval. The patient voices understanding of current disease status and treatment  options and is in agreement with the current care plan.  All questions were answered. The patient knows to call the clinic with any problems, questions or concerns. We can certainly see the patient much sooner if necessary.   This is a shared visit. Plan discussed with Dr. Julien Nordmann.    Tammy Butters E, PA-C 12/6/20169:28 AM  ADDENDUM: Hematology/Oncology Attending: I had a face to face encounter with the patient. I recommended her care plan. This is a very pleasant 74 years old white female recently diagnosed with stage II a non-small cell lung cancer, squamous cell carcinoma presented with large right upper lobe lung mass and right hilar lymphadenopathy. The patient is currently undergoing the adjuvant systemic chemotherapy with carboplatin and paclitaxel is status post 1 cycle. She tolerated the first cycle of her treatment well except for aching pain and fatigue after the Neulasta injection. I recommended for the patient to take pain medication with Percocet in addition to Claritin for the pain associated with the Neulasta injection. She also had mild peripheral neuropathy. She would come back for follow-up visit in 2 weeks for reevaluation before starting cycle #2 of her treatment. She was advised to  call immediately if she has any concerning symptoms in the interval.  Disclaimer: This note was dictated with voice recognition software. Similar sounding words can inadvertently be transcribed and may be missed upon review. Eilleen Kempf., MD 10/18/2015

## 2015-10-18 NOTE — Telephone Encounter (Signed)
pt sch already made-gave pt copy

## 2015-10-18 NOTE — Telephone Encounter (Signed)
Patient has been seen 10-17-2015 by APP and [facility] walk-in by son also.

## 2015-10-21 ENCOUNTER — Ambulatory Visit (HOSPITAL_COMMUNITY)
Admission: RE | Admit: 2015-10-21 | Discharge: 2015-10-21 | Disposition: A | Discharge status: Home / Self Care | Payer: Medicare Other | Source: Ambulatory Visit | Attending: Internal Medicine | Admitting: Internal Medicine

## 2015-10-21 DIAGNOSIS — R9082 White matter disease, unspecified: Secondary | ICD-10-CM | POA: Diagnosis not present

## 2015-10-21 DIAGNOSIS — C3490 Malignant neoplasm of unspecified part of unspecified bronchus or lung: Secondary | ICD-10-CM | POA: Diagnosis not present

## 2015-10-21 DIAGNOSIS — C3491 Malignant neoplasm of unspecified part of right bronchus or lung: Secondary | ICD-10-CM | POA: Diagnosis not present

## 2015-10-21 MED ORDER — GADOBENATE DIMEGLUMINE 529 MG/ML IV SOLN
10.0000 mL | Freq: Once | INTRAVENOUS | Status: AC | PRN
  Administered 2015-10-21: 8 mL via INTRAVENOUS

## 2015-10-24 ENCOUNTER — Telehealth: Payer: Self-pay | Admitting: *Deleted

## 2015-10-24 DIAGNOSIS — C3490 Malignant neoplasm of unspecified part of unspecified bronchus or lung: Secondary | ICD-10-CM

## 2015-10-24 MED ORDER — AZITHROMYCIN 250 MG PO TABS: ORAL_TABLET | ORAL | Status: DC

## 2015-10-24 NOTE — Telephone Encounter (Signed)
Patient called.  She started with a temperature off and on last night.  Highest was 101.2 @ 5am.  She has no fever no.  She does have some sinus issues - pressure in her face, nosebleed, cought with white/yellow thick phlegm.  Her bones under her eyes are sore.  She has a history of sinus problems.  Received taxol/carbo on 10/11/15 - first cycle.  Saw Sharene Butters on 12--6-16.  Labs were fine.  Discussed with Dr. Julien Nordmann.  OK to use OTC medication for this as well as a Zpack.  Pt. Comes tomorrow for lab work also.  Will escribe Z-pack to [facility].  Patient aware.  She also has robitussin DM and is already using a neubulizer three times a day.  Encouraged her to drink plenty of fluids and let us know if she is not feeling better.

## 2015-10-25 ENCOUNTER — Other Ambulatory Visit (HOSPITAL_BASED_OUTPATIENT_CLINIC_OR_DEPARTMENT_OTHER): Payer: Medicare Other

## 2015-10-25 DIAGNOSIS — C3491 Malignant neoplasm of unspecified part of right bronchus or lung: Secondary | ICD-10-CM

## 2015-10-25 LAB — COMPREHENSIVE METABOLIC PANEL
ALT: 21 U/L (ref 0–55)
AST: 16 U/L (ref 5–34)
Albumin: 3.6 g/dL (ref 3.5–5.0)
Alkaline Phosphatase: 131 U/L (ref 40–150)
Anion Gap: 10 mEq/L (ref 3–11)
BUN: 22.1 mg/dL (ref 7.0–26.0)
CO2: 25 mEq/L (ref 22–29)
Calcium: 10 mg/dL (ref 8.4–10.4)
Chloride: 98 mEq/L (ref 98–109)
Creatinine: 1.4 mg/dL — ABNORMAL HIGH (ref 0.6–1.1)
EGFR: 38 mL/min/{1.73_m2} — ABNORMAL LOW (ref >90)
Glucose: 100 mg/dl (ref 70–140)
Potassium: 5 mEq/L (ref 3.5–5.1)
Sodium: 134 mEq/L — ABNORMAL LOW (ref 136–145)
Total Bilirubin: 0.54 mg/dL (ref 0.20–1.20)
Total Protein: 7.2 g/dL (ref 6.4–8.3)

## 2015-10-25 LAB — CBC WITH DIFFERENTIAL/PLATELET
BASO%: 0.9 % (ref 0.0–2.0)
Basophils Absolute: 0.2 10*3/uL — ABNORMAL HIGH (ref 0.0–0.1)
EOS%: 0.2 % (ref 0.0–7.0)
Eosinophils Absolute: 0 10*3/uL (ref 0.0–0.5)
HCT: 34.4 % — ABNORMAL LOW (ref 34.8–46.6)
HGB: 11.1 g/dL — ABNORMAL LOW (ref 11.6–15.9)
LYMPH%: 14.5 % (ref 14.0–49.7)
MCH: 29.5 pg (ref 25.1–34.0)
MCHC: 32.2 g/dL (ref 31.5–36.0)
MCV: 91.8 fL (ref 79.5–101.0)
MONO#: 1.9 10*3/uL — ABNORMAL HIGH (ref 0.1–0.9)
MONO%: 10.4 % (ref 0.0–14.0)
NEUT#: 13.4 10*3/uL — ABNORMAL HIGH (ref 1.5–6.5)
NEUT%: 74 % (ref 38.4–76.8)
Platelets: 172 10*3/uL (ref 145–400)
RBC: 3.75 10*6/uL (ref 3.70–5.45)
RDW: 16.3 % — ABNORMAL HIGH (ref 11.2–14.5)
WBC: 18.2 10*3/uL — ABNORMAL HIGH (ref 3.9–10.3)
lymph#: 2.6 10*3/uL (ref 0.9–3.3)

## 2015-10-28 ENCOUNTER — Telehealth: Payer: Self-pay

## 2015-10-28 NOTE — Telephone Encounter (Signed)
Melissa from [facility] called asking for a wig rx. Please put cancer dx on rx. Fax to [phone]

## 2015-10-31 ENCOUNTER — Encounter: Payer: Self-pay | Admitting: Family Medicine

## 2015-10-31 ENCOUNTER — Other Ambulatory Visit: Payer: Self-pay

## 2015-10-31 DIAGNOSIS — I10 Essential (primary) hypertension: Secondary | ICD-10-CM

## 2015-10-31 MED ORDER — VALSARTAN 80 MG PO TABS: 80.0000 mg | ORAL_TABLET | Freq: Every day | ORAL | Status: DC

## 2015-11-01 ENCOUNTER — Encounter: Payer: Self-pay | Admitting: Internal Medicine

## 2015-11-01 ENCOUNTER — Ambulatory Visit (HOSPITAL_BASED_OUTPATIENT_CLINIC_OR_DEPARTMENT_OTHER): Payer: Medicare Other | Admitting: Internal Medicine

## 2015-11-01 ENCOUNTER — Other Ambulatory Visit (HOSPITAL_BASED_OUTPATIENT_CLINIC_OR_DEPARTMENT_OTHER): Payer: Medicare Other

## 2015-11-01 ENCOUNTER — Ambulatory Visit (HOSPITAL_BASED_OUTPATIENT_CLINIC_OR_DEPARTMENT_OTHER): Payer: Medicare Other

## 2015-11-01 VITALS — BP 147/78 | HR 89 | Temp 98.4°F | Resp 17 | Ht 64.0 in | Wt 175.8 lb

## 2015-11-01 DIAGNOSIS — G620 Drug-induced polyneuropathy: Secondary | ICD-10-CM

## 2015-11-01 DIAGNOSIS — C3491 Malignant neoplasm of unspecified part of right bronchus or lung: Secondary | ICD-10-CM

## 2015-11-01 DIAGNOSIS — C3490 Malignant neoplasm of unspecified part of unspecified bronchus or lung: Secondary | ICD-10-CM

## 2015-11-01 DIAGNOSIS — Z5111 Encounter for antineoplastic chemotherapy: Secondary | ICD-10-CM

## 2015-11-01 DIAGNOSIS — T451X5A Adverse effect of antineoplastic and immunosuppressive drugs, initial encounter: Secondary | ICD-10-CM

## 2015-11-01 HISTORY — DX: Drug-induced polyneuropathy: T45.1X5A

## 2015-11-01 HISTORY — DX: Adverse effect of antineoplastic and immunosuppressive drugs, initial encounter: G62.0

## 2015-11-01 LAB — CBC WITH DIFFERENTIAL/PLATELET
BASO%: 1.4 % (ref 0.0–2.0)
Basophils Absolute: 0.1 10*3/uL (ref 0.0–0.1)
EOS%: 0.7 % (ref 0.0–7.0)
Eosinophils Absolute: 0.1 10*3/uL (ref 0.0–0.5)
HCT: 35.8 % (ref 34.8–46.6)
HGB: 11.7 g/dL (ref 11.6–15.9)
LYMPH%: 29.4 % (ref 14.0–49.7)
MCH: 30 pg (ref 25.1–34.0)
MCHC: 32.6 g/dL (ref 31.5–36.0)
MCV: 92 fL (ref 79.5–101.0)
MONO#: 0.8 10*3/uL (ref 0.1–0.9)
MONO%: 11.2 % (ref 0.0–14.0)
NEUT#: 4.1 10*3/uL (ref 1.5–6.5)
NEUT%: 57.3 % (ref 38.4–76.8)
Platelets: 671 10*3/uL — ABNORMAL HIGH (ref 145–400)
RBC: 3.88 10*6/uL (ref 3.70–5.45)
RDW: 17 % — ABNORMAL HIGH (ref 11.2–14.5)
WBC: 7.1 10*3/uL (ref 3.9–10.3)
lymph#: 2.1 10*3/uL (ref 0.9–3.3)

## 2015-11-01 LAB — COMPREHENSIVE METABOLIC PANEL
ALT: 18 U/L (ref 0–55)
AST: 18 U/L (ref 5–34)
Albumin: 3.7 g/dL (ref 3.5–5.0)
Alkaline Phosphatase: 92 U/L (ref 40–150)
Anion Gap: 9 mEq/L (ref 3–11)
BUN: 29.9 mg/dL — ABNORMAL HIGH (ref 7.0–26.0)
CO2: 24 mEq/L (ref 22–29)
Calcium: 10.2 mg/dL (ref 8.4–10.4)
Chloride: 105 mEq/L (ref 98–109)
Creatinine: 1.1 mg/dL (ref 0.6–1.1)
EGFR: 50 mL/min/{1.73_m2} — ABNORMAL LOW (ref >90)
Glucose: 84 mg/dl (ref 70–140)
Potassium: 4.8 mEq/L (ref 3.5–5.1)
Sodium: 138 mEq/L (ref 136–145)
Total Bilirubin: <0.3 mg/dL (ref 0.20–1.20)
Total Protein: 7.7 g/dL (ref 6.4–8.3)

## 2015-11-01 MED ORDER — DEXTROSE 5 % IV SOLN
175.0000 mg/m2 | Freq: Once | INTRAVENOUS | Status: AC
  Administered 2015-11-01: 336 mg via INTRAVENOUS
  Filled 2015-11-01: qty 56

## 2015-11-01 MED ORDER — DIPHENHYDRAMINE HCL 50 MG/ML IJ SOLN
INTRAMUSCULAR | Status: AC
  Filled 2015-11-01: qty 1

## 2015-11-01 MED ORDER — FAMOTIDINE IN NACL 20-0.9 MG/50ML-% IV SOLN
INTRAVENOUS | Status: AC
Start: 2015-11-01 — End: 2015-11-01
  Filled 2015-11-01: qty 50

## 2015-11-01 MED ORDER — SODIUM CHLORIDE 0.9 % IV SOLN
502.2000 mg | Freq: Once | INTRAVENOUS | Status: AC
  Administered 2015-11-01: 500 mg via INTRAVENOUS
  Filled 2015-11-01: qty 50

## 2015-11-01 MED ORDER — DIPHENHYDRAMINE HCL 50 MG/ML IJ SOLN
50.0000 mg | Freq: Once | INTRAMUSCULAR | Status: AC
  Administered 2015-11-01: 50 mg via INTRAVENOUS

## 2015-11-01 MED ORDER — SODIUM CHLORIDE 0.9 % IV SOLN
Freq: Once | INTRAVENOUS | Status: AC
  Administered 2015-11-01: 13:00:00 via INTRAVENOUS

## 2015-11-01 MED ORDER — UNABLE TO FIND: Status: DC

## 2015-11-01 MED ORDER — FAMOTIDINE IN NACL 20-0.9 MG/50ML-% IV SOLN
20.0000 mg | Freq: Once | INTRAVENOUS | Status: AC
  Administered 2015-11-01: 20 mg via INTRAVENOUS

## 2015-11-01 MED ORDER — SODIUM CHLORIDE 0.9 % IV SOLN
Freq: Once | INTRAVENOUS | Status: AC
  Administered 2015-11-01: 13:00:00 via INTRAVENOUS
  Filled 2015-11-01: qty 8

## 2015-11-01 NOTE — Progress Notes (Signed)
[facility] Telephone:([phone]) [phone]   Fax:(336) [place], MD [address] [place] [id number]  DIAGNOSIS: Stage IIA (T2a, N1, M0) non-small cell lung cancer, squamous cell carcinoma presented with large right upper lobe lung mass with right hilar involvement diagnosed in November 2016.  PRIOR THERAPY:None  CURRENT THERAPY: Neoadjuvant systemic chemotherapy with carboplatin for AUC of 6 and paclitaxel 200 MG/M2 every 3 weeks, status post 1 cycle.  INTERVAL HISTORY: DANYEL TOBEY 74 y.o. female returns to the clinic today for follow-up visit. The patient tolerated the first cycle of her treatment fairly well except for the aching pain after the Neulasta injection. She denied having any significant weight loss or night sweats. She has mild peripheral neuropathy. She denied having any nausea or vomiting, no fever or chills. She has no significant chest pain, shortness of breath, cough or hemoptysis. She is here today to start cycle #2 of her systemic chemotherapy.  MEDICAL HISTORY: Past Medical History  Diagnosis Date  . COPD (chronic obstructive pulmonary disease) ([facility])   . History of kidney cancer   . Hypertension   . Depression   . Asthma   . Glaucoma   . Hyperlipidemia   . Heart murmur   . Cancer ([facility])   . Non-small cell carcinoma of lung, stage 2 ([facility]) 10/03/2015    ALLERGIES:  is allergic to bee venom; levofloxacin; amlodipine; and hctz.  MEDICATIONS:  Current Outpatient Prescriptions  Medication Sig Dispense Refill  . acetaminophen (TYLENOL) 500 MG tablet Take 1,000 mg by mouth every 6 (six) hours as needed for mild pain, moderate pain, fever or headache.    . albuterol (PROVENTIL HFA;VENTOLIN HFA) 108 (90 BASE) MCG/ACT inhaler Inhale 2 puffs into the lungs every 4 (four) hours as needed for wheezing or shortness of breath. 1 Inhaler 0  . aspirin EC 81 MG tablet Take 81 mg by mouth daily.    Marland Kitchen atorvastatin  (LIPITOR) 40 MG tablet TAKE ONE TABLET BY MOUTH ONCE DAILY. 90 tablet 3  . benzonatate (TESSALON) 200 MG capsule Take 1 capsule (200 mg total) by mouth 3 (three) times daily as needed for cough. 20 capsule 0  . CALCIUM PO Take 1 tablet by mouth daily.    . Cholecalciferol (VITAMIN D-3 PO) Take 1 tablet by mouth daily.    . fish oil-omega-3 fatty acids 1000 MG capsule Take 1 g by mouth daily.      . fluticasone (FLONASE) 50 MCG/ACT nasal spray Place 2 sprays into both nostrils daily. 9.9 g 0  . HYDROcodone-acetaminophen (NORCO/VICODIN) 5-325 MG tablet Take 1 tablet by mouth every 6 (six) hours as needed for moderate pain. 30 tablet 0  . ipratropium-albuterol (DUONEB) 0.5-2.5 (3) MG/3ML SOLN Take 3 mLs by nebulization 3 (three) times daily. 360 mL 1  . loratadine (CLARITIN) 10 MG tablet Take 10 mg by mouth daily.    Marland Kitchen LORazepam (ATIVAN) 0.5 MG tablet Take 1 tablet (0.5 mg total) by mouth 2 (two) times daily as needed for anxiety. 10 tablet 1  . mometasone-formoterol (DULERA) 200-5 MCG/ACT AERO Inhale 2 puffs into the lungs 2 (two) times daily. 1 Inhaler 3  . oxyCODONE-acetaminophen (PERCOCET/ROXICET) 5-325 MG tablet Take 1-2 tablets by mouth every 6 (six) hours as needed for severe pain. 45 tablet 0  . polyvinyl alcohol-povidone (REFRESH) 1.4-0.6 % ophthalmic solution Place 2 drops into both eyes daily as needed (for dry eyes).    . prochlorperazine (COMPAZINE) 10  MG tablet Take 1 tablet (10 mg total) by mouth every 6 (six) hours as needed for nausea or vomiting. 30 tablet 0  . UNABLE TO FIND Med Name: Wig 1 each 0  . valsartan (DIOVAN) 80 MG tablet Take 1 tablet (80 mg total) by mouth daily. 90 tablet 0   No current facility-administered medications for this visit.    SURGICAL HISTORY:  Past Surgical History  Procedure Laterality Date  . Nephrectomy    . Kidney cancer    . Tubal ligation    . Spine surgery    . Eye surgery    . Video bronchoscopy Bilateral 09/20/2015    Procedure: VIDEO  BRONCHOSCOPY WITHOUT FLUORO;  Surgeon: Rigoberto Noel, MD;  Location: [facility];  Service: Cardiopulmonary;  Laterality: Bilateral;    REVIEW OF SYSTEMS:  A comprehensive review of systems was negative except for: Constitutional: positive for fatigue Musculoskeletal: positive for arthralgias   PHYSICAL EXAMINATION: General appearance: alert, cooperative, fatigued and no distress Head: Normocephalic, without obvious abnormality, atraumatic Neck: no adenopathy, no JVD, supple, symmetrical, trachea midline and thyroid not enlarged, symmetric, no tenderness/mass/nodules Lymph nodes: Cervical, supraclavicular, and axillary nodes normal. Resp: clear to auscultation bilaterally Back: symmetric, no curvature. ROM normal. No CVA tenderness. Cardio: regular rate and rhythm, S1, S2 normal, no murmur, click, rub or gallop GI: soft, non-tender; bowel sounds normal; no masses,  no organomegaly Extremities: extremities normal, atraumatic, no cyanosis or edema  ECOG PERFORMANCE STATUS: 1 - Symptomatic but completely ambulatory  Blood pressure 147/78, pulse 89, temperature 98.4 F (36.9 C), temperature source Oral, resp. rate 17, height '5\' 4"'$  (1.626 m), weight 175 lb 12.8 oz (79.742 kg), SpO2 98 %.  LABORATORY DATA: Lab Results  Component Value Date   WBC 7.1 11/01/2015   HGB 11.7 11/01/2015   HCT 35.8 11/01/2015   MCV 92.0 11/01/2015   PLT 671* 11/01/2015      Chemistry      Component Value Date/Time   NA 138 11/01/2015 0959   NA 138 09/20/2015 0550   K 4.8 11/01/2015 0959   K 4.4 09/20/2015 0550   CL 101 09/20/2015 0550   CO2 24 11/01/2015 0959   CO2 31 09/20/2015 0550   BUN 29.9* 11/01/2015 0959   BUN 18 09/20/2015 0550   BUN 24* 12/11/2011   CREATININE 1.1 11/01/2015 0959   CREATININE 1.04* 09/20/2015 0550   CREATININE 1.08 12/02/2013 0911   GLU 105 12/10/2009      Component Value Date/Time   CALCIUM 10.2 11/01/2015 0959   CALCIUM 9.5 09/20/2015 0550   ALKPHOS 92 11/01/2015  0959   ALKPHOS 70 09/19/2015 0755   AST 18 11/01/2015 0959   AST 16 09/19/2015 0755   ALT 18 11/01/2015 0959   ALT 17 09/19/2015 0755   BILITOT <0.30 11/01/2015 0959   BILITOT 0.9 09/19/2015 0755       RADIOGRAPHIC STUDIES: Mr Jeri Cos Wo Contrast  2015/10/26  CLINICAL DATA:  Non-small cell lung cancer. Personal history of renal cell carcinoma. EXAM: MRI HEAD WITHOUT AND WITH CONTRAST TECHNIQUE: Multiplanar, multiecho pulse sequences of the brain and surrounding structures were obtained without and with intravenous contrast. CONTRAST:  33m MULTIHANCE GADOBENATE DIMEGLUMINE 529 MG/ML IV SOLN COMPARISON:  None. FINDINGS: Scattered periventricular and subcortical T2 changes bilaterally are slightly greater than expected for age. No acute infarct, hemorrhage, or mass lesion is present. The ventricles are of normal size. No significant extraaxial fluid collection is present. White matter changes extend into the brainstem.  The cerebellum is within normal limits. The internal auditory canals are normal bilaterally. Flow is present in the major intracranial arteries. Bilateral lens replacements are present. The globes and orbits are intact. The paranasal sinuses and mastoid air cells are clear. The postcontrast images demonstrate no pathologic enhancement to suggest metastatic disease of the brain or meninges. IMPRESSION: 1. Mild age advanced white matter disease. This likely reflects the sequela of chronic microvascular ischemia. 2. No pathologic enhancement to suggest metastatic disease of the brain or meninges. Electronically Signed   By: San Morelle M.D.   On: 10/21/2015 14:54    ASSESSMENT AND PLAN: This is a very pleasant 74 years old white female recently diagnosed with stage II a non-small cell lung cancer, squamous cell carcinoma and currently undergoing neoadjuvant systemic chemotherapy with carboplatin and paclitaxel status post 1 cycle. The patient tolerated the first cycle well except  for fatigue and aching pain after the Neulasta injection as well as mild peripheral neuropathy. I recommended for the patient to proceed with cycle #2 today as scheduled. I would reduce the dose of paclitaxel to 175 MG/M2 starting from cycle #2 because of the peripheral neuropathy. The patient would come back for follow-up visit in 3 weeks for reevaluation before starting cycle #3 of her treatment. She was advised to call immediately if she has any concerning symptoms in the interval. The patient voices understanding of current disease status and treatment options and is in agreement with the current care plan.  All questions were answered. The patient knows to call the clinic with any problems, questions or concerns. We can certainly see the patient much sooner if necessary.  Disclaimer: This note was dictated with voice recognition software. Similar sounding words can inadvertently be transcribed and may not be corrected upon review.

## 2015-11-01 NOTE — Patient Instructions (Signed)
[facility] Discharge Instructions for Patients Receiving Chemotherapy  Today you received the following chemotherapy agents Taxol and Carboplatin.  To help prevent nausea and vomiting after your treatment, we encourage you to take your nausea medication as directed.   If you develop nausea and vomiting that is not controlled by your nausea medication, call the clinic.   BELOW ARE SYMPTOMS THAT SHOULD BE REPORTED IMMEDIATELY:  *FEVER GREATER THAN 100.5 F  *CHILLS WITH OR WITHOUT FEVER  NAUSEA AND VOMITING THAT IS NOT CONTROLLED WITH YOUR NAUSEA MEDICATION  *UNUSUAL SHORTNESS OF BREATH  *UNUSUAL BRUISING OR BLEEDING  TENDERNESS IN MOUTH AND THROAT WITH OR WITHOUT PRESENCE OF ULCERS  *URINARY PROBLEMS  *BOWEL PROBLEMS  UNUSUAL RASH Items with * indicate a potential emergency and should be followed up as soon as possible.  Feel free to call the clinic you have any questions or concerns. The clinic phone number is [phone].  Please show the [facility] at check-in to the Emergency Department and triage nurse.  Paclitaxel injection What is this medicine? PACLITAXEL (PAK li TAX el) is a chemotherapy drug. It targets fast dividing cells, like cancer cells, and causes these cells to die. This medicine is used to treat ovarian cancer, breast cancer, and other cancers. This medicine may be used for other purposes; ask your health care provider or pharmacist if you have questions. What should I tell my health care provider before I take this medicine? They need to know if you have any of these conditions: -blood disorders -irregular heartbeat -infection (especially a virus infection such as chickenpox, cold sores, or herpes) -liver disease -previous or ongoing radiation therapy -an unusual or allergic reaction to paclitaxel, alcohol, polyoxyethylated castor oil, other chemotherapy agents, other medicines, foods, dyes, or preservatives -pregnant or trying to  get pregnant -breast-feeding How should I use this medicine? This drug is given as an infusion into a vein. It is administered in a hospital or clinic by a specially trained health care professional. Talk to your pediatrician regarding the use of this medicine in children. Special care may be needed. Overdosage: If you think you have taken too much of this medicine contact a poison control center or emergency room at once. NOTE: This medicine is only for you. Do not share this medicine with others. What if I miss a dose? It is important not to miss your dose. Call your doctor or health care professional if you are unable to keep an appointment. What may interact with this medicine? Do not take this medicine with any of the following medications: -disulfiram -metronidazole This medicine may also interact with the following medications: -cyclosporine -diazepam -ketoconazole -medicines to increase blood counts like filgrastim, pegfilgrastim, sargramostim -other chemotherapy drugs like cisplatin, doxorubicin, epirubicin, etoposide, teniposide, vincristine -quinidine -testosterone -vaccines -verapamil Talk to your doctor or health care professional before taking any of these medicines: -acetaminophen -aspirin -ibuprofen -ketoprofen -naproxen This list may not describe all possible interactions. Give your health care provider a list of all the medicines, herbs, non-prescription drugs, or dietary supplements you use. Also tell them if you smoke, drink alcohol, or use illegal drugs. Some items may interact with your medicine. What should I watch for while using this medicine? Your condition will be monitored carefully while you are receiving this medicine. You will need important blood work done while you are taking this medicine. This drug may make you feel generally unwell. This is not uncommon, as chemotherapy can affect healthy cells as well  as cancer cells. Report any side effects.  Continue your course of treatment even though you feel ill unless your doctor tells you to stop. This medicine can cause serious allergic reactions. To reduce your risk you will need to take other medicine(s) before treatment with this medicine. In some cases, you may be given additional medicines to help with side effects. Follow all directions for their use. Call your doctor or health care professional for advice if you get a fever, chills or sore throat, or other symptoms of a cold or flu. Do not treat yourself. This drug decreases your body's ability to fight infections. Try to avoid being around people who are sick. This medicine may increase your risk to bruise or bleed. Call your doctor or health care professional if you notice any unusual bleeding. Be careful brushing and flossing your teeth or using a toothpick because you may get an infection or bleed more easily. If you have any dental work done, tell your dentist you are receiving this medicine. Avoid taking products that contain aspirin, acetaminophen, ibuprofen, naproxen, or ketoprofen unless instructed by your doctor. These medicines may hide a fever. Do not become pregnant while taking this medicine. Women should inform their doctor if they wish to become pregnant or think they might be pregnant. There is a potential for serious side effects to an unborn child. Talk to your health care professional or pharmacist for more information. Do not breast-feed an infant while taking this medicine. Men are advised not to father a child while receiving this medicine. This product may contain alcohol. Ask your pharmacist or healthcare provider if this medicine contains alcohol. Be sure to tell all healthcare providers you are taking this medicine. Certain medicines, like metronidazole and disulfiram, can cause an unpleasant reaction when taken with alcohol. The reaction includes flushing, headache, nausea, vomiting, sweating, and increased thirst. The  reaction can last from 30 minutes to several hours. What side effects may I notice from receiving this medicine? Side effects that you should report to your doctor or health care professional as soon as possible: -allergic reactions like skin rash, itching or hives, swelling of the face, lips, or tongue -low blood counts - This drug may decrease the number of white blood cells, red blood cells and platelets. You may be at increased risk for infections and bleeding. -signs of infection - fever or chills, cough, sore throat, pain or difficulty passing urine -signs of decreased platelets or bleeding - bruising, pinpoint red spots on the skin, black, tarry stools, nosebleeds -signs of decreased red blood cells - unusually weak or tired, fainting spells, lightheadedness -breathing problems -chest pain -high or low blood pressure -mouth sores -nausea and vomiting -pain, swelling, redness or irritation at the injection site -pain, tingling, numbness in the hands or feet -slow or irregular heartbeat -swelling of the ankle, feet, hands Side effects that usually do not require medical attention (report to your doctor or health care professional if they continue or are bothersome): -bone pain -complete hair loss including hair on your head, underarms, pubic hair, eyebrows, and eyelashes -changes in the color of fingernails -diarrhea -loosening of the fingernails -loss of appetite -muscle or joint pain -red flush to skin -sweating This list may not describe all possible side effects. Call your doctor for medical advice about side effects. You may report side effects to FDA at 1-800-FDA-1088. Where should I keep my medicine? This drug is given in a hospital or clinic and will not be stored at  home. NOTE: This sheet is a summary. It may not cover all possible information. If you have questions about this medicine, talk to your doctor, pharmacist, or health care provider.    2016, [employer]vier/Gold  Standard. (2015-06-16 13:02:56)  Carboplatin injection What is this medicine? CARBOPLATIN (KAR boe pla tin) is a chemotherapy drug. It targets fast dividing cells, like cancer cells, and causes these cells to die. This medicine is used to treat ovarian cancer and many other cancers. This medicine may be used for other purposes; ask your health care provider or pharmacist if you have questions. What should I tell my health care provider before I take this medicine? They need to know if you have any of these conditions: -blood disorders -hearing problems -kidney disease -recent or ongoing radiation therapy -an unusual or allergic reaction to carboplatin, cisplatin, other chemotherapy, other medicines, foods, dyes, or preservatives -pregnant or trying to get pregnant -breast-feeding How should I use this medicine? This drug is usually given as an infusion into a vein. It is administered in a hospital or clinic by a specially trained health care professional. Talk to your pediatrician regarding the use of this medicine in children. Special care may be needed. Overdosage: If you think you have taken too much of this medicine contact a poison control center or emergency room at once. NOTE: This medicine is only for you. Do not share this medicine with others. What if I miss a dose? It is important not to miss a dose. Call your doctor or health care professional if you are unable to keep an appointment. What may interact with this medicine? -medicines for seizures -medicines to increase blood counts like filgrastim, pegfilgrastim, sargramostim -some antibiotics like amikacin, gentamicin, neomycin, streptomycin, tobramycin -vaccines Talk to your doctor or health care professional before taking any of these medicines: -acetaminophen -aspirin -ibuprofen -ketoprofen -naproxen This list may not describe all possible interactions. Give your health care provider a list of all the medicines, herbs,  non-prescription drugs, or dietary supplements you use. Also tell them if you smoke, drink alcohol, or use illegal drugs. Some items may interact with your medicine. What should I watch for while using this medicine? Your condition will be monitored carefully while you are receiving this medicine. You will need important blood work done while you are taking this medicine. This drug may make you feel generally unwell. This is not uncommon, as chemotherapy can affect healthy cells as well as cancer cells. Report any side effects. Continue your course of treatment even though you feel ill unless your doctor tells you to stop. In some cases, you may be given additional medicines to help with side effects. Follow all directions for their use. Call your doctor or health care professional for advice if you get a fever, chills or sore throat, or other symptoms of a cold or flu. Do not treat yourself. This drug decreases your body's ability to fight infections. Try to avoid being around people who are sick. This medicine may increase your risk to bruise or bleed. Call your doctor or health care professional if you notice any unusual bleeding. Be careful brushing and flossing your teeth or using a toothpick because you may get an infection or bleed more easily. If you have any dental work done, tell your dentist you are receiving this medicine. Avoid taking products that contain aspirin, acetaminophen, ibuprofen, naproxen, or ketoprofen unless instructed by your doctor. These medicines may hide a fever. Do not become pregnant while taking this medicine.  Women should inform their doctor if they wish to become pregnant or think they might be pregnant. There is a potential for serious side effects to an unborn child. Talk to your health care professional or pharmacist for more information. Do not breast-feed an infant while taking this medicine. What side effects may I notice from receiving this medicine? Side effects  that you should report to your doctor or health care professional as soon as possible: -allergic reactions like skin rash, itching or hives, swelling of the face, lips, or tongue -signs of infection - fever or chills, cough, sore throat, pain or difficulty passing urine -signs of decreased platelets or bleeding - bruising, pinpoint red spots on the skin, black, tarry stools, nosebleeds -signs of decreased red blood cells - unusually weak or tired, fainting spells, lightheadedness -breathing problems -changes in hearing -changes in vision -chest pain -high blood pressure -low blood counts - This drug may decrease the number of white blood cells, red blood cells and platelets. You may be at increased risk for infections and bleeding. -nausea and vomiting -pain, swelling, redness or irritation at the injection site -pain, tingling, numbness in the hands or feet -problems with balance, talking, walking -trouble passing urine or change in the amount of urine Side effects that usually do not require medical attention (report to your doctor or health care professional if they continue or are bothersome): -hair loss -loss of appetite -metallic taste in the mouth or changes in taste This list may not describe all possible side effects. Call your doctor for medical advice about side effects. You may report side effects to FDA at 1-800-FDA-1088. Where should I keep my medicine? This drug is given in a hospital or clinic and will not be stored at home. NOTE: This sheet is a summary. It may not cover all possible information. If you have questions about this medicine, talk to your doctor, pharmacist, or health care provider.    2016, [employer] Standard. (2008-02-03 14:38:05)

## 2015-11-01 NOTE — Addendum Note (Signed)
Addended by: Lucile Crater on: 11/01/2015 10:28 AM   Modules accepted: Orders

## 2015-11-02 ENCOUNTER — Telehealth: Payer: Self-pay | Admitting: Internal Medicine

## 2015-11-02 NOTE — Telephone Encounter (Signed)
Spoke with patient and she is aware of her appointments °

## 2015-11-03 ENCOUNTER — Ambulatory Visit (HOSPITAL_BASED_OUTPATIENT_CLINIC_OR_DEPARTMENT_OTHER): Payer: Medicare Other

## 2015-11-03 VITALS — BP 128/67 | HR 101 | Temp 99.4°F

## 2015-11-03 DIAGNOSIS — C3491 Malignant neoplasm of unspecified part of right bronchus or lung: Secondary | ICD-10-CM

## 2015-11-03 DIAGNOSIS — Z5189 Encounter for other specified aftercare: Secondary | ICD-10-CM | POA: Diagnosis not present

## 2015-11-03 MED ORDER — PEGFILGRASTIM INJECTION 6 MG/0.6ML ~~LOC~~
6.0000 mg | PREFILLED_SYRINGE | Freq: Once | SUBCUTANEOUS | Status: AC
  Administered 2015-11-03: 6 mg via SUBCUTANEOUS
  Filled 2015-11-03: qty 0.6

## 2015-11-07 LAB — AFB CULTURE WITH SMEAR (NOT AT ARMC)
Acid Fast Smear: NONE SEEN
Special Requests: NORMAL

## 2015-11-08 ENCOUNTER — Other Ambulatory Visit (HOSPITAL_BASED_OUTPATIENT_CLINIC_OR_DEPARTMENT_OTHER): Payer: Medicare Other

## 2015-11-08 ENCOUNTER — Encounter: Payer: Self-pay | Admitting: Family Medicine

## 2015-11-08 ENCOUNTER — Other Ambulatory Visit: Payer: Self-pay

## 2015-11-08 DIAGNOSIS — C3491 Malignant neoplasm of unspecified part of right bronchus or lung: Secondary | ICD-10-CM

## 2015-11-08 DIAGNOSIS — J209 Acute bronchitis, unspecified: Secondary | ICD-10-CM

## 2015-11-08 LAB — CBC WITH DIFFERENTIAL/PLATELET
BASO%: 0.5 % (ref 0.0–2.0)
Basophils Absolute: 0.1 10*3/uL (ref 0.0–0.1)
EOS%: 0.9 % (ref 0.0–7.0)
Eosinophils Absolute: 0.1 10*3/uL (ref 0.0–0.5)
HCT: 32.4 % — ABNORMAL LOW (ref 34.8–46.6)
HGB: 10.6 g/dL — ABNORMAL LOW (ref 11.6–15.9)
LYMPH%: 18.6 % (ref 14.0–49.7)
MCH: 30.2 pg (ref 25.1–34.0)
MCHC: 32.7 g/dL (ref 31.5–36.0)
MCV: 92.3 fL (ref 79.5–101.0)
MONO#: 1.1 10*3/uL — ABNORMAL HIGH (ref 0.1–0.9)
MONO%: 11.6 % (ref 0.0–14.0)
NEUT#: 6.6 10*3/uL — ABNORMAL HIGH (ref 1.5–6.5)
NEUT%: 68.4 % (ref 38.4–76.8)
Platelets: 463 10*3/uL — ABNORMAL HIGH (ref 145–400)
RBC: 3.51 10*6/uL — ABNORMAL LOW (ref 3.70–5.45)
RDW: 17.7 % — ABNORMAL HIGH (ref 11.2–14.5)
WBC: 9.7 10*3/uL (ref 3.9–10.3)
lymph#: 1.8 10*3/uL (ref 0.9–3.3)

## 2015-11-08 LAB — COMPREHENSIVE METABOLIC PANEL
ALT: 35 U/L (ref 0–55)
AST: 28 U/L (ref 5–34)
Albumin: 3.9 g/dL (ref 3.5–5.0)
Alkaline Phosphatase: 157 U/L — ABNORMAL HIGH (ref 40–150)
Anion Gap: 9 mEq/L (ref 3–11)
BUN: 32.7 mg/dL — ABNORMAL HIGH (ref 7.0–26.0)
CO2: 24 mEq/L (ref 22–29)
Calcium: 9.9 mg/dL (ref 8.4–10.4)
Chloride: 105 mEq/L (ref 98–109)
Creatinine: 1.3 mg/dL — ABNORMAL HIGH (ref 0.6–1.1)
EGFR: 43 mL/min/{1.73_m2} — ABNORMAL LOW (ref >90)
Glucose: 93 mg/dl (ref 70–140)
Potassium: 5.1 mEq/L (ref 3.5–5.1)
Sodium: 139 mEq/L (ref 136–145)
Total Bilirubin: 0.47 mg/dL (ref 0.20–1.20)
Total Protein: 7.2 g/dL (ref 6.4–8.3)

## 2015-11-08 MED ORDER — MOMETASONE FURO-FORMOTEROL FUM 200-5 MCG/ACT IN AERO: 2.0000 | INHALATION_SPRAY | Freq: Two times a day (BID) | RESPIRATORY_TRACT | Status: DC

## 2015-11-15 ENCOUNTER — Other Ambulatory Visit (HOSPITAL_BASED_OUTPATIENT_CLINIC_OR_DEPARTMENT_OTHER): Payer: Medicare Other

## 2015-11-15 DIAGNOSIS — C3491 Malignant neoplasm of unspecified part of right bronchus or lung: Secondary | ICD-10-CM | POA: Diagnosis present

## 2015-11-15 LAB — CBC WITH DIFFERENTIAL/PLATELET
BASO%: 0.2 % (ref 0.0–2.0)
Basophils Absolute: 0 10*3/uL (ref 0.0–0.1)
EOS%: 0.3 % (ref 0.0–7.0)
Eosinophils Absolute: 0 10*3/uL (ref 0.0–0.5)
HCT: 33.3 % — ABNORMAL LOW (ref 34.8–46.6)
HGB: 10.9 g/dL — ABNORMAL LOW (ref 11.6–15.9)
LYMPH%: 20.6 % (ref 14.0–49.7)
MCH: 30 pg (ref 25.1–34.0)
MCHC: 32.7 g/dL (ref 31.5–36.0)
MCV: 91.7 fL (ref 79.5–101.0)
MONO#: 1.1 10*3/uL — ABNORMAL HIGH (ref 0.1–0.9)
MONO%: 8.6 % (ref 0.0–14.0)
NEUT#: 8.6 10*3/uL — ABNORMAL HIGH (ref 1.5–6.5)
NEUT%: 70.3 % (ref 38.4–76.8)
Platelets: 136 10*3/uL — ABNORMAL LOW (ref 145–400)
RBC: 3.63 10*6/uL — ABNORMAL LOW (ref 3.70–5.45)
RDW: 17.8 % — ABNORMAL HIGH (ref 11.2–14.5)
WBC: 12.3 10*3/uL — ABNORMAL HIGH (ref 3.9–10.3)
lymph#: 2.5 10*3/uL (ref 0.9–3.3)
nRBC: 0 % (ref 0–0)

## 2015-11-15 LAB — COMPREHENSIVE METABOLIC PANEL
ALT: 33 U/L (ref 0–55)
AST: 22 U/L (ref 5–34)
Albumin: 4.1 g/dL (ref 3.5–5.0)
Alkaline Phosphatase: 157 U/L — ABNORMAL HIGH (ref 40–150)
Anion Gap: 8 mEq/L (ref 3–11)
BUN: 17.6 mg/dL (ref 7.0–26.0)
CO2: 27 mEq/L (ref 22–29)
Calcium: 9.9 mg/dL (ref 8.4–10.4)
Chloride: 105 mEq/L (ref 98–109)
Creatinine: 1 mg/dL (ref 0.6–1.1)
EGFR: 56 mL/min/{1.73_m2} — ABNORMAL LOW (ref >90)
Glucose: 100 mg/dl (ref 70–140)
Potassium: 4.8 mEq/L (ref 3.5–5.1)
Sodium: 140 mEq/L (ref 136–145)
Total Bilirubin: 0.36 mg/dL (ref 0.20–1.20)
Total Protein: 7.5 g/dL (ref 6.4–8.3)

## 2015-11-22 ENCOUNTER — Ambulatory Visit (HOSPITAL_BASED_OUTPATIENT_CLINIC_OR_DEPARTMENT_OTHER): Payer: Medicare Other | Admitting: Internal Medicine

## 2015-11-22 ENCOUNTER — Ambulatory Visit (HOSPITAL_BASED_OUTPATIENT_CLINIC_OR_DEPARTMENT_OTHER): Payer: Medicare Other

## 2015-11-22 ENCOUNTER — Telehealth: Payer: Self-pay | Admitting: Internal Medicine

## 2015-11-22 ENCOUNTER — Encounter: Payer: Self-pay | Admitting: Internal Medicine

## 2015-11-22 ENCOUNTER — Other Ambulatory Visit (HOSPITAL_BASED_OUTPATIENT_CLINIC_OR_DEPARTMENT_OTHER): Payer: Medicare Other

## 2015-11-22 VITALS — BP 134/67 | HR 89 | Temp 99.0°F | Resp 18 | Ht 64.0 in | Wt 176.1 lb

## 2015-11-22 DIAGNOSIS — C3411 Malignant neoplasm of upper lobe, right bronchus or lung: Secondary | ICD-10-CM

## 2015-11-22 DIAGNOSIS — G620 Drug-induced polyneuropathy: Secondary | ICD-10-CM | POA: Diagnosis not present

## 2015-11-22 DIAGNOSIS — C3491 Malignant neoplasm of unspecified part of right bronchus or lung: Secondary | ICD-10-CM

## 2015-11-22 DIAGNOSIS — M4316 Spondylolisthesis, lumbar region: Secondary | ICD-10-CM

## 2015-11-22 DIAGNOSIS — Z5111 Encounter for antineoplastic chemotherapy: Secondary | ICD-10-CM | POA: Diagnosis not present

## 2015-11-22 DIAGNOSIS — T451X5A Adverse effect of antineoplastic and immunosuppressive drugs, initial encounter: Secondary | ICD-10-CM

## 2015-11-22 DIAGNOSIS — M5432 Sciatica, left side: Secondary | ICD-10-CM | POA: Diagnosis not present

## 2015-11-22 LAB — CBC WITH DIFFERENTIAL/PLATELET
BASO%: 0.4 % (ref 0.0–2.0)
Basophils Absolute: 0 10*3/uL (ref 0.0–0.1)
EOS%: 0.5 % (ref 0.0–7.0)
Eosinophils Absolute: 0 10*3/uL (ref 0.0–0.5)
HCT: 35.4 % (ref 34.8–46.6)
HGB: 11.5 g/dL — ABNORMAL LOW (ref 11.6–15.9)
LYMPH%: 30.1 % (ref 14.0–49.7)
MCH: 30.3 pg (ref 25.1–34.0)
MCHC: 32.5 g/dL (ref 31.5–36.0)
MCV: 93.4 fL (ref 79.5–101.0)
MONO#: 0.9 10*3/uL (ref 0.1–0.9)
MONO%: 11.2 % (ref 0.0–14.0)
NEUT#: 4.6 10*3/uL (ref 1.5–6.5)
NEUT%: 57.8 % (ref 38.4–76.8)
Platelets: 186 10*3/uL (ref 145–400)
RBC: 3.79 10*6/uL (ref 3.70–5.45)
RDW: 19.9 % — ABNORMAL HIGH (ref 11.2–14.5)
WBC: 7.9 10*3/uL (ref 3.9–10.3)
lymph#: 2.4 10*3/uL (ref 0.9–3.3)
nRBC: 0 % (ref 0–0)

## 2015-11-22 LAB — COMPREHENSIVE METABOLIC PANEL
ALT: 24 U/L (ref 0–55)
AST: 17 U/L (ref 5–34)
Albumin: 4.3 g/dL (ref 3.5–5.0)
Alkaline Phosphatase: 124 U/L (ref 40–150)
Anion Gap: 9 mEq/L (ref 3–11)
BUN: 25.1 mg/dL (ref 7.0–26.0)
CO2: 25 mEq/L (ref 22–29)
Calcium: 10 mg/dL (ref 8.4–10.4)
Chloride: 105 mEq/L (ref 98–109)
Creatinine: 1.1 mg/dL (ref 0.6–1.1)
EGFR: 48 mL/min/{1.73_m2} — ABNORMAL LOW (ref >90)
Glucose: 87 mg/dl (ref 70–140)
Potassium: 4.7 mEq/L (ref 3.5–5.1)
Sodium: 139 mEq/L (ref 136–145)
Total Bilirubin: 0.35 mg/dL (ref 0.20–1.20)
Total Protein: 7.7 g/dL (ref 6.4–8.3)

## 2015-11-22 MED ORDER — SODIUM CHLORIDE 0.9 % IV SOLN
Freq: Once | INTRAVENOUS | Status: AC
  Administered 2015-11-22: 11:00:00 via INTRAVENOUS

## 2015-11-22 MED ORDER — METHYLPREDNISOLONE 4 MG PO TBPK: ORAL_TABLET | ORAL | Status: DC

## 2015-11-22 MED ORDER — DIPHENHYDRAMINE HCL 50 MG/ML IJ SOLN
50.0000 mg | Freq: Once | INTRAMUSCULAR | Status: AC
  Administered 2015-11-22: 50 mg via INTRAVENOUS

## 2015-11-22 MED ORDER — SODIUM CHLORIDE 0.9 % IJ SOLN
10.0000 mL | INTRAMUSCULAR | Status: DC | PRN
  Filled 2015-11-22: qty 10

## 2015-11-22 MED ORDER — OXYCODONE-ACETAMINOPHEN 5-325 MG PO TABS: 1.0000 | ORAL_TABLET | Freq: Four times a day (QID) | ORAL | Status: DC | PRN

## 2015-11-22 MED ORDER — FAMOTIDINE IN NACL 20-0.9 MG/50ML-% IV SOLN
INTRAVENOUS | Status: AC
  Filled 2015-11-22: qty 50

## 2015-11-22 MED ORDER — HEPARIN SOD (PORK) LOCK FLUSH 100 UNIT/ML IV SOLN
500.0000 [IU] | Freq: Once | INTRAVENOUS | Status: DC | PRN
  Filled 2015-11-22: qty 5

## 2015-11-22 MED ORDER — DEXTROSE 5 % IV SOLN
175.0000 mg/m2 | Freq: Once | INTRAVENOUS | Status: AC
  Administered 2015-11-22: 336 mg via INTRAVENOUS
  Filled 2015-11-22: qty 56

## 2015-11-22 MED ORDER — SODIUM CHLORIDE 0.9 % IV SOLN
502.2000 mg | Freq: Once | INTRAVENOUS | Status: AC
  Administered 2015-11-22: 500 mg via INTRAVENOUS
  Filled 2015-11-22: qty 50

## 2015-11-22 MED ORDER — FAMOTIDINE IN NACL 20-0.9 MG/50ML-% IV SOLN
20.0000 mg | Freq: Once | INTRAVENOUS | Status: AC
  Administered 2015-11-22: 20 mg via INTRAVENOUS

## 2015-11-22 MED ORDER — DIPHENHYDRAMINE HCL 50 MG/ML IJ SOLN
INTRAMUSCULAR | Status: AC
  Filled 2015-11-22: qty 1

## 2015-11-22 MED ORDER — SODIUM CHLORIDE 0.9 % IV SOLN
Freq: Once | INTRAVENOUS | Status: AC
  Administered 2015-11-22: 11:00:00 via INTRAVENOUS
  Filled 2015-11-22: qty 8

## 2015-11-22 NOTE — Telephone Encounter (Signed)
Talked to patient here in office. Scheduled appt.       [facility]. °

## 2015-11-22 NOTE — Progress Notes (Signed)
[facility] [phone]   Fax:([phone]) [place], MD [address] [place] [address]  DIAGNOSIS: Stage IIA (T2a, N1, M0) non-small cell lung cancer, squamous cell carcinoma presented with large right upper lobe lung mass with right hilar involvement diagnosed in November 2016.  PRIOR THERAPY:None  CURRENT THERAPY: Neoadjuvant systemic chemotherapy with carboplatin for AUC of 6 and paclitaxel 200 MG/M2 every 3 weeks, status post 2 cycles.  INTERVAL HISTORY: Tammy Ball 75 y.o. female returns to the clinic today for follow-up visit. The patient tolerated the second cycle of her treatment much better. She continues to have mild fatigue and aching pain after the Neulasta injection. She denied having any significant weight loss or night sweats. She still has mild peripheral neuropathy but not worse than 3 weeks ago. She denied having any nausea or vomiting, no fever or chills. She has no significant chest pain, shortness of breath, cough or hemoptysis. The patient also has sciatica pain with radiation to the left leg. She is here today to start cycle #3 of her systemic chemotherapy.  MEDICAL HISTORY: Past Medical History  Diagnosis Date  . COPD (chronic obstructive pulmonary disease) ([place])   . History of kidney cancer   . Hypertension   . Depression   . Asthma   . Glaucoma   . Hyperlipidemia   . Heart murmur   . Cancer ([place])   . Non-small cell carcinoma of lung, stage 2 ([place]) 10/03/2015  . Non-small cell carcinoma of right lung, stage 2 ([place]) 10/03/2015  . Chemotherapy-induced neuropathy ([place]) 11/01/2015    ALLERGIES:  is allergic to bee venom; levofloxacin; amlodipine; and hctz.  MEDICATIONS:  Current Outpatient Prescriptions  Medication Sig Dispense Refill  . acetaminophen (TYLENOL) 500 MG tablet Take 1,000 mg by mouth every 6 (six) hours as needed for mild pain, moderate pain, fever or headache.    .  albuterol (PROVENTIL HFA;VENTOLIN HFA) 108 (90 BASE) MCG/ACT inhaler Inhale 2 puffs into the lungs every 4 (four) hours as needed for wheezing or shortness of breath. 1 Inhaler 0  . aspirin EC 81 MG tablet Take 81 mg by mouth daily.    Marland Kitchen atorvastatin (LIPITOR) 40 MG tablet TAKE ONE TABLET BY MOUTH ONCE DAILY. 90 tablet 3  . benzonatate (TESSALON) 200 MG capsule Take 1 capsule (200 mg total) by mouth 3 (three) times daily as needed for cough. 20 capsule 0  . CALCIUM PO Take 1 tablet by mouth daily.    . Cholecalciferol (VITAMIN D-3 PO) Take 1 tablet by mouth daily.    . fish oil-omega-3 fatty acids 1000 MG capsule Take 1 g by mouth daily.      . fluticasone (FLONASE) 50 MCG/ACT nasal spray Place 2 sprays into both nostrils daily. 9.9 g 0  . HYDROcodone-acetaminophen (NORCO/VICODIN) 5-325 MG tablet Take 1 tablet by mouth every 6 (six) hours as needed for moderate pain. 30 tablet 0  . ipratropium-albuterol (DUONEB) 0.5-2.5 (3) MG/3ML SOLN Take 3 mLs by nebulization 3 (three) times daily. 360 mL 1  . loratadine (CLARITIN) 10 MG tablet Take 10 mg by mouth daily.    Marland Kitchen LORazepam (ATIVAN) 0.5 MG tablet Take 1 tablet (0.5 mg total) by mouth 2 (two) times daily as needed for anxiety. 10 tablet 1  . mometasone-formoterol (DULERA) 200-5 MCG/ACT AERO Inhale 2 puffs into the lungs 2 (two) times daily. 3 Inhaler 0  . polyvinyl alcohol-povidone (REFRESH) 1.4-0.6 % ophthalmic solution Place  2 drops into both eyes daily as needed (for dry eyes).    . prochlorperazine (COMPAZINE) 10 MG tablet Take 1 tablet (10 mg total) by mouth every 6 (six) hours as needed for nausea or vomiting. 30 tablet 0  . UNABLE TO FIND Med Name: Wig 1 each 0  . valsartan (DIOVAN) 80 MG tablet Take 1 tablet (80 mg total) by mouth daily. 90 tablet 0  . oxyCODONE-acetaminophen (PERCOCET/ROXICET) 5-325 MG tablet Take 1-2 tablets by mouth every 6 (six) hours as needed for severe pain. (Patient not taking: Reported on 11/22/2015) 45 tablet 0   No  current facility-administered medications for this visit.    SURGICAL HISTORY:  Past Surgical History  Procedure Laterality Date  . Nephrectomy    . Kidney cancer    . Tubal ligation    . Spine surgery    . Eye surgery    . Video bronchoscopy Bilateral 09/20/2015    Procedure: VIDEO BRONCHOSCOPY WITHOUT FLUORO;  Surgeon: Rigoberto Noel, MD;  Location: [facility];  Service: Cardiopulmonary;  Laterality: Bilateral;    REVIEW OF SYSTEMS:  Constitutional: negative Eyes: negative Ears, nose, mouth, throat, and face: negative Respiratory: negative Cardiovascular: negative Gastrointestinal: negative Genitourinary:negative Integument/breast: negative Hematologic/lymphatic: negative Musculoskeletal:positive for back pain Neurological: positive for paresthesia Behavioral/Psych: negative Endocrine: negative Allergic/Immunologic: negative   PHYSICAL EXAMINATION: General appearance: alert, cooperative, fatigued and no distress Head: Normocephalic, without obvious abnormality, atraumatic Neck: no adenopathy, no JVD, supple, symmetrical, trachea midline and thyroid not enlarged, symmetric, no tenderness/mass/nodules Lymph nodes: Cervical, supraclavicular, and axillary nodes normal. Resp: clear to auscultation bilaterally Back: symmetric, no curvature. ROM normal. No CVA tenderness. Cardio: regular rate and rhythm, S1, S2 normal, no murmur, click, rub or gallop GI: soft, non-tender; bowel sounds normal; no masses,  no organomegaly Extremities: extremities normal, atraumatic, no cyanosis or edema Neurologic: Alert and oriented X 3, normal strength and tone. Normal symmetric reflexes. Normal coordination and gait  ECOG PERFORMANCE STATUS: 1 - Symptomatic but completely ambulatory  Blood pressure 134/67, pulse 89, temperature 99 F (37.2 C), temperature source Oral, resp. rate 18, height '5\' 4"'$  (1.626 m), weight 176 lb 1.6 oz (79.878 kg), SpO2 98 %.  LABORATORY DATA: Lab Results    Component Value Date   WBC 7.9 11/22/2015   HGB 11.5* 11/22/2015   HCT 35.4 11/22/2015   MCV 93.4 11/22/2015   PLT 186 11/22/2015      Chemistry      Component Value Date/Time   NA 139 11/22/2015 0855   NA 138 09/20/2015 0550   K 4.7 11/22/2015 0855   K 4.4 09/20/2015 0550   CL 101 09/20/2015 0550   CO2 25 11/22/2015 0855   CO2 31 09/20/2015 0550   BUN 25.1 11/22/2015 0855   BUN 18 09/20/2015 0550   BUN 24* 12/11/2011   CREATININE 1.1 11/22/2015 0855   CREATININE 1.04* 09/20/2015 0550   CREATININE 1.08 12/02/2013 0911   GLU 105 12/10/2009      Component Value Date/Time   CALCIUM 10.0 11/22/2015 0855   CALCIUM 9.5 09/20/2015 0550   ALKPHOS 124 11/22/2015 0855   ALKPHOS 70 09/19/2015 0755   AST 17 11/22/2015 0855   AST 16 09/19/2015 0755   ALT 24 11/22/2015 0855   ALT 17 09/19/2015 0755   BILITOT 0.35 11/22/2015 0855   BILITOT 0.9 09/19/2015 0755       RADIOGRAPHIC STUDIES: No results found.  ASSESSMENT AND PLAN: This is a very pleasant 75 years old white female recently  diagnosed with stage II a non-small cell lung cancer, squamous cell carcinoma and currently undergoing neoadjuvant systemic chemotherapy with carboplatin and paclitaxel status post 2 cycles. The patient tolerated the last cycle well except for fatigue and aching pain after the Neulasta injection as well as mild peripheral neuropathy. I recommended for the patient to proceed with cycle #3 today as scheduled with reduced dose of paclitaxel to 175 MG/M2 because of the peripheral neuropathy. She would come back for follow-up visit in one month's for reevaluation after repeating CT scan of the chest for restaging of her disease. For the sciatica pain, I started the patient on Medrol Dosepak and she was also given refill of Percocet. She was advised to call immediately if she has any concerning symptoms in the interval. The patient voices understanding of current disease status and treatment options and is  in agreement with the current care plan.  All questions were answered. The patient knows to call the clinic with any problems, questions or concerns. We can certainly see the patient much sooner if necessary.  Disclaimer: This note was dictated with voice recognition software. Similar sounding words can inadvertently be transcribed and may not be corrected upon review.

## 2015-11-22 NOTE — Patient Instructions (Signed)
[facility] Discharge Instructions for Patients Receiving Chemotherapy  Today you received the following chemotherapy agents Taxol and Carboplatin.  To help prevent nausea and vomiting after your treatment, we encourage you to take your nausea medication as directed.   If you develop nausea and vomiting that is not controlled by your nausea medication, call the clinic.   BELOW ARE SYMPTOMS THAT SHOULD BE REPORTED IMMEDIATELY:  *FEVER GREATER THAN 100.5 F  *CHILLS WITH OR WITHOUT FEVER  NAUSEA AND VOMITING THAT IS NOT CONTROLLED WITH YOUR NAUSEA MEDICATION  *UNUSUAL SHORTNESS OF BREATH  *UNUSUAL BRUISING OR BLEEDING  TENDERNESS IN MOUTH AND THROAT WITH OR WITHOUT PRESENCE OF ULCERS  *URINARY PROBLEMS  *BOWEL PROBLEMS  UNUSUAL RASH Items with * indicate a potential emergency and should be followed up as soon as possible.  Feel free to call the clinic you have any questions or concerns. The clinic phone number is [phone].  Please show the [facility] at check-in to the Emergency Department and triage nurse.  Paclitaxel injection What is this medicine? PACLITAXEL (PAK li TAX el) is a chemotherapy drug. It targets fast dividing cells, like cancer cells, and causes these cells to die. This medicine is used to treat ovarian cancer, breast cancer, and other cancers. This medicine may be used for other purposes; ask your health care provider or pharmacist if you have questions. What should I tell my health care provider before I take this medicine? They need to know if you have any of these conditions: -blood disorders -irregular heartbeat -infection (especially a virus infection such as chickenpox, cold sores, or herpes) -liver disease -previous or ongoing radiation therapy -an unusual or allergic reaction to paclitaxel, alcohol, polyoxyethylated castor oil, other chemotherapy agents, other medicines, foods, dyes, or preservatives -pregnant or trying to  get pregnant -breast-feeding How should I use this medicine? This drug is given as an infusion into a vein. It is administered in a hospital or clinic by a specially trained health care professional. Talk to your pediatrician regarding the use of this medicine in children. Special care may be needed. Overdosage: If you think you have taken too much of this medicine contact a poison control center or emergency room at once. NOTE: This medicine is only for you. Do not share this medicine with others. What if I miss a dose? It is important not to miss your dose. Call your doctor or health care professional if you are unable to keep an appointment. What may interact with this medicine? Do not take this medicine with any of the following medications: -disulfiram -metronidazole This medicine may also interact with the following medications: -cyclosporine -diazepam -ketoconazole -medicines to increase blood counts like filgrastim, pegfilgrastim, sargramostim -other chemotherapy drugs like cisplatin, doxorubicin, epirubicin, etoposide, teniposide, vincristine -quinidine -testosterone -vaccines -verapamil Talk to your doctor or health care professional before taking any of these medicines: -acetaminophen -aspirin -ibuprofen -ketoprofen -naproxen This list may not describe all possible interactions. Give your health care provider a list of all the medicines, herbs, non-prescription drugs, or dietary supplements you use. Also tell them if you smoke, drink alcohol, or use illegal drugs. Some items may interact with your medicine. What should I watch for while using this medicine? Your condition will be monitored carefully while you are receiving this medicine. You will need important blood work done while you are taking this medicine. This drug may make you feel generally unwell. This is not uncommon, as chemotherapy can affect healthy cells as well  as cancer cells. Report any side effects.  Continue your course of treatment even though you feel ill unless your doctor tells you to stop. This medicine can cause serious allergic reactions. To reduce your risk you will need to take other medicine(s) before treatment with this medicine. In some cases, you may be given additional medicines to help with side effects. Follow all directions for their use. Call your doctor or health care professional for advice if you get a fever, chills or sore throat, or other symptoms of a cold or flu. Do not treat yourself. This drug decreases your body's ability to fight infections. Try to avoid being around people who are sick. This medicine may increase your risk to bruise or bleed. Call your doctor or health care professional if you notice any unusual bleeding. Be careful brushing and flossing your teeth or using a toothpick because you may get an infection or bleed more easily. If you have any dental work done, tell your dentist you are receiving this medicine. Avoid taking products that contain aspirin, acetaminophen, ibuprofen, naproxen, or ketoprofen unless instructed by your doctor. These medicines may hide a fever. Do not become pregnant while taking this medicine. Women should inform their doctor if they wish to become pregnant or think they might be pregnant. There is a potential for serious side effects to an unborn child. Talk to your health care professional or pharmacist for more information. Do not breast-feed an infant while taking this medicine. Men are advised not to father a child while receiving this medicine. This product may contain alcohol. Ask your pharmacist or healthcare provider if this medicine contains alcohol. Be sure to tell all healthcare providers you are taking this medicine. Certain medicines, like metronidazole and disulfiram, can cause an unpleasant reaction when taken with alcohol. The reaction includes flushing, headache, nausea, vomiting, sweating, and increased thirst. The  reaction can last from 30 minutes to several hours. What side effects may I notice from receiving this medicine? Side effects that you should report to your doctor or health care professional as soon as possible: -allergic reactions like skin rash, itching or hives, swelling of the face, lips, or tongue -low blood counts - This drug may decrease the number of white blood cells, red blood cells and platelets. You may be at increased risk for infections and bleeding. -signs of infection - fever or chills, cough, sore throat, pain or difficulty passing urine -signs of decreased platelets or bleeding - bruising, pinpoint red spots on the skin, black, tarry stools, nosebleeds -signs of decreased red blood cells - unusually weak or tired, fainting spells, lightheadedness -breathing problems -chest pain -high or low blood pressure -mouth sores -nausea and vomiting -pain, swelling, redness or irritation at the injection site -pain, tingling, numbness in the hands or feet -slow or irregular heartbeat -swelling of the ankle, feet, hands Side effects that usually do not require medical attention (report to your doctor or health care professional if they continue or are bothersome): -bone pain -complete hair loss including hair on your head, underarms, pubic hair, eyebrows, and eyelashes -changes in the color of fingernails -diarrhea -loosening of the fingernails -loss of appetite -muscle or joint pain -red flush to skin -sweating This list may not describe all possible side effects. Call your doctor for medical advice about side effects. You may report side effects to FDA at 1-800-FDA-1088. Where should I keep my medicine? This drug is given in a hospital or clinic and will not be stored at  home. NOTE: This sheet is a summary. It may not cover all possible information. If you have questions about this medicine, talk to your doctor, pharmacist, or health care provider.    2016, Elsevier/Gold  Standard. (2015-06-16 13:02:56)  Carboplatin injection What is this medicine? CARBOPLATIN (KAR boe pla tin) is a chemotherapy drug. It targets fast dividing cells, like cancer cells, and causes these cells to die. This medicine is used to treat ovarian cancer and many other cancers. This medicine may be used for other purposes; ask your health care provider or pharmacist if you have questions. What should I tell my health care provider before I take this medicine? They need to know if you have any of these conditions: -blood disorders -hearing problems -kidney disease -recent or ongoing radiation therapy -an unusual or allergic reaction to carboplatin, cisplatin, other chemotherapy, other medicines, foods, dyes, or preservatives -pregnant or trying to get pregnant -breast-feeding How should I use this medicine? This drug is usually given as an infusion into a vein. It is administered in a hospital or clinic by a specially trained health care professional. Talk to your pediatrician regarding the use of this medicine in children. Special care may be needed. Overdosage: If you think you have taken too much of this medicine contact a poison control center or emergency room at once. NOTE: This medicine is only for you. Do not share this medicine with others. What if I miss a dose? It is important not to miss a dose. Call your doctor or health care professional if you are unable to keep an appointment. What may interact with this medicine? -medicines for seizures -medicines to increase blood counts like filgrastim, pegfilgrastim, sargramostim -some antibiotics like amikacin, gentamicin, neomycin, streptomycin, tobramycin -vaccines Talk to your doctor or health care professional before taking any of these medicines: -acetaminophen -aspirin -ibuprofen -ketoprofen -naproxen This list may not describe all possible interactions. Give your health care provider a list of all the medicines, herbs,  non-prescription drugs, or dietary supplements you use. Also tell them if you smoke, drink alcohol, or use illegal drugs. Some items may interact with your medicine. What should I watch for while using this medicine? Your condition will be monitored carefully while you are receiving this medicine. You will need important blood work done while you are taking this medicine. This drug may make you feel generally unwell. This is not uncommon, as chemotherapy can affect healthy cells as well as cancer cells. Report any side effects. Continue your course of treatment even though you feel ill unless your doctor tells you to stop. In some cases, you may be given additional medicines to help with side effects. Follow all directions for their use. Call your doctor or health care professional for advice if you get a fever, chills or sore throat, or other symptoms of a cold or flu. Do not treat yourself. This drug decreases your body's ability to fight infections. Try to avoid being around people who are sick. This medicine may increase your risk to bruise or bleed. Call your doctor or health care professional if you notice any unusual bleeding. Be careful brushing and flossing your teeth or using a toothpick because you may get an infection or bleed more easily. If you have any dental work done, tell your dentist you are receiving this medicine. Avoid taking products that contain aspirin, acetaminophen, ibuprofen, naproxen, or ketoprofen unless instructed by your doctor. These medicines may hide a fever. Do not become pregnant while taking this medicine.  Women should inform their doctor if they wish to become pregnant or think they might be pregnant. There is a potential for serious side effects to an unborn child. Talk to your health care professional or pharmacist for more information. Do not breast-feed an infant while taking this medicine. What side effects may I notice from receiving this medicine? Side effects  that you should report to your doctor or health care professional as soon as possible: -allergic reactions like skin rash, itching or hives, swelling of the face, lips, or tongue -signs of infection - fever or chills, cough, sore throat, pain or difficulty passing urine -signs of decreased platelets or bleeding - bruising, pinpoint red spots on the skin, black, tarry stools, nosebleeds -signs of decreased red blood cells - unusually weak or tired, fainting spells, lightheadedness -breathing problems -changes in hearing -changes in vision -chest pain -high blood pressure -low blood counts - This drug may decrease the number of white blood cells, red blood cells and platelets. You may be at increased risk for infections and bleeding. -nausea and vomiting -pain, swelling, redness or irritation at the injection site -pain, tingling, numbness in the hands or feet -problems with balance, talking, walking -trouble passing urine or change in the amount of urine Side effects that usually do not require medical attention (report to your doctor or health care professional if they continue or are bothersome): -hair loss -loss of appetite -metallic taste in the mouth or changes in taste This list may not describe all possible side effects. Call your doctor for medical advice about side effects. You may report side effects to FDA at 1-800-FDA-1088. Where should I keep my medicine? This drug is given in a hospital or clinic and will not be stored at home. NOTE: This sheet is a summary. It may not cover all possible information. If you have questions about this medicine, talk to your doctor, pharmacist, or health care provider.    2016, [employer]/Gold Standard. (2008-02-03 14:38:05)

## 2015-11-24 ENCOUNTER — Ambulatory Visit (HOSPITAL_BASED_OUTPATIENT_CLINIC_OR_DEPARTMENT_OTHER): Payer: Medicare Other

## 2015-11-24 VITALS — BP 121/63 | HR 85 | Temp 98.4°F

## 2015-11-24 DIAGNOSIS — C3411 Malignant neoplasm of upper lobe, right bronchus or lung: Secondary | ICD-10-CM

## 2015-11-24 DIAGNOSIS — Z5189 Encounter for other specified aftercare: Secondary | ICD-10-CM

## 2015-11-24 DIAGNOSIS — C3491 Malignant neoplasm of unspecified part of right bronchus or lung: Secondary | ICD-10-CM

## 2015-11-24 MED ORDER — PEGFILGRASTIM INJECTION 6 MG/0.6ML ~~LOC~~
6.0000 mg | PREFILLED_SYRINGE | Freq: Once | SUBCUTANEOUS | Status: AC
  Administered 2015-11-24: 6 mg via SUBCUTANEOUS
  Filled 2015-11-24: qty 0.6

## 2015-11-29 ENCOUNTER — Other Ambulatory Visit (HOSPITAL_BASED_OUTPATIENT_CLINIC_OR_DEPARTMENT_OTHER): Payer: Medicare Other

## 2015-11-29 DIAGNOSIS — C3491 Malignant neoplasm of unspecified part of right bronchus or lung: Secondary | ICD-10-CM

## 2015-11-29 LAB — COMPREHENSIVE METABOLIC PANEL
ALT: 17 U/L (ref 0–55)
AST: 13 U/L (ref 5–34)
Albumin: 4.1 g/dL (ref 3.5–5.0)
Alkaline Phosphatase: 146 U/L (ref 40–150)
Anion Gap: 7 mEq/L (ref 3–11)
BUN: 36.1 mg/dL — ABNORMAL HIGH (ref 7.0–26.0)
CO2: 24 mEq/L (ref 22–29)
Calcium: 9.5 mg/dL (ref 8.4–10.4)
Chloride: 106 mEq/L (ref 98–109)
Creatinine: 1.1 mg/dL (ref 0.6–1.1)
EGFR: 48 mL/min/{1.73_m2} — ABNORMAL LOW (ref >90)
Glucose: 95 mg/dl (ref 70–140)
Potassium: 5.6 mEq/L — ABNORMAL HIGH (ref 3.5–5.1)
Sodium: 138 mEq/L (ref 136–145)
Total Bilirubin: 0.42 mg/dL (ref 0.20–1.20)
Total Protein: 7 g/dL (ref 6.4–8.3)

## 2015-11-29 LAB — CBC WITH DIFFERENTIAL/PLATELET
BASO%: 0.7 % (ref 0.0–2.0)
Basophils Absolute: 0.1 10*3/uL (ref 0.0–0.1)
EOS%: 0.5 % (ref 0.0–7.0)
Eosinophils Absolute: 0.1 10*3/uL (ref 0.0–0.5)
HCT: 31.5 % — ABNORMAL LOW (ref 34.8–46.6)
HGB: 10.3 g/dL — ABNORMAL LOW (ref 11.6–15.9)
LYMPH%: 14.2 % (ref 14.0–49.7)
MCH: 31.1 pg (ref 25.1–34.0)
MCHC: 32.7 g/dL (ref 31.5–36.0)
MCV: 95.2 fL (ref 79.5–101.0)
MONO#: 1.2 10*3/uL — ABNORMAL HIGH (ref 0.1–0.9)
MONO%: 9.8 % (ref 0.0–14.0)
NEUT#: 9.1 10*3/uL — ABNORMAL HIGH (ref 1.5–6.5)
NEUT%: 74.8 % (ref 38.4–76.8)
Platelets: 282 10*3/uL (ref 145–400)
RBC: 3.31 10*6/uL — ABNORMAL LOW (ref 3.70–5.45)
RDW: 22.4 % — ABNORMAL HIGH (ref 11.2–14.5)
WBC: 12.1 10*3/uL — ABNORMAL HIGH (ref 3.9–10.3)
lymph#: 1.7 10*3/uL (ref 0.9–3.3)

## 2015-12-06 ENCOUNTER — Other Ambulatory Visit (HOSPITAL_BASED_OUTPATIENT_CLINIC_OR_DEPARTMENT_OTHER): Payer: Medicare Other

## 2015-12-06 DIAGNOSIS — C3491 Malignant neoplasm of unspecified part of right bronchus or lung: Secondary | ICD-10-CM | POA: Diagnosis not present

## 2015-12-06 DIAGNOSIS — N3941 Urge incontinence: Secondary | ICD-10-CM | POA: Diagnosis not present

## 2015-12-06 DIAGNOSIS — C642 Malignant neoplasm of left kidney, except renal pelvis: Secondary | ICD-10-CM | POA: Diagnosis not present

## 2015-12-06 DIAGNOSIS — Z0000 Encounter for general adult medical examination without abnormal findings: Secondary | ICD-10-CM | POA: Diagnosis not present

## 2015-12-06 LAB — CBC WITH DIFFERENTIAL/PLATELET
BASO%: 0.3 % (ref 0.0–2.0)
Basophils Absolute: 0 10*3/uL (ref 0.0–0.1)
EOS%: 0.1 % (ref 0.0–7.0)
Eosinophils Absolute: 0 10*3/uL (ref 0.0–0.5)
HCT: 28 % — ABNORMAL LOW (ref 34.8–46.6)
HGB: 9.1 g/dL — ABNORMAL LOW (ref 11.6–15.9)
LYMPH%: 22.2 % (ref 14.0–49.7)
MCH: 30.9 pg (ref 25.1–34.0)
MCHC: 32.4 g/dL (ref 31.5–36.0)
MCV: 95.3 fL (ref 79.5–101.0)
MONO#: 0.8 10*3/uL (ref 0.1–0.9)
MONO%: 6.4 % (ref 0.0–14.0)
NEUT#: 8.5 10*3/uL — ABNORMAL HIGH (ref 1.5–6.5)
NEUT%: 71 % (ref 38.4–76.8)
Platelets: 92 10*3/uL — ABNORMAL LOW (ref 145–400)
RBC: 2.93 10*6/uL — ABNORMAL LOW (ref 3.70–5.45)
RDW: 22.6 % — ABNORMAL HIGH (ref 11.2–14.5)
WBC: 12 10*3/uL — ABNORMAL HIGH (ref 3.9–10.3)
lymph#: 2.7 10*3/uL (ref 0.9–3.3)

## 2015-12-06 LAB — COMPREHENSIVE METABOLIC PANEL
ALT: 25 U/L (ref 0–55)
AST: 19 U/L (ref 5–34)
Albumin: 3.8 g/dL (ref 3.5–5.0)
Alkaline Phosphatase: 136 U/L (ref 40–150)
Anion Gap: 7 mEq/L (ref 3–11)
BUN: 24.2 mg/dL (ref 7.0–26.0)
CO2: 25 mEq/L (ref 22–29)
Calcium: 9.5 mg/dL (ref 8.4–10.4)
Chloride: 108 mEq/L (ref 98–109)
Creatinine: 1 mg/dL (ref 0.6–1.1)
EGFR: 54 mL/min/{1.73_m2} — ABNORMAL LOW (ref >90)
Glucose: 99 mg/dl (ref 70–140)
Potassium: 5.3 mEq/L — ABNORMAL HIGH (ref 3.5–5.1)
Sodium: 139 mEq/L (ref 136–145)
Total Bilirubin: <0.3 mg/dL (ref 0.20–1.20)
Total Protein: 6.8 g/dL (ref 6.4–8.3)

## 2015-12-08 ENCOUNTER — Telehealth: Payer: Self-pay | Admitting: *Deleted

## 2015-12-08 NOTE — Telephone Encounter (Signed)
Pt called states she finished Medrol Dose pak and percocet is not helping the sciatica pain. Pt requested prednisone or something to help with nerve pain. Reviewed with MD, pt to see be seen by her orthopedic doctor. Called pt and gave above instructions. Pt verbalized understanding. No further concerns.

## 2015-12-10 ENCOUNTER — Ambulatory Visit (INDEPENDENT_AMBULATORY_CARE_PROVIDER_SITE_OTHER): Payer: Medicare Other | Admitting: Internal Medicine

## 2015-12-10 ENCOUNTER — Ambulatory Visit (INDEPENDENT_AMBULATORY_CARE_PROVIDER_SITE_OTHER): Payer: Medicare Other

## 2015-12-10 VITALS — BP 122/72 | HR 88 | Temp 98.5°F | Resp 17 | Ht 64.5 in | Wt 177.0 lb

## 2015-12-10 DIAGNOSIS — M79605 Pain in left leg: Secondary | ICD-10-CM

## 2015-12-10 DIAGNOSIS — M4316 Spondylolisthesis, lumbar region: Secondary | ICD-10-CM

## 2015-12-10 DIAGNOSIS — E785 Hyperlipidemia, unspecified: Secondary | ICD-10-CM | POA: Diagnosis not present

## 2015-12-10 DIAGNOSIS — C3491 Malignant neoplasm of unspecified part of right bronchus or lung: Secondary | ICD-10-CM | POA: Diagnosis not present

## 2015-12-10 DIAGNOSIS — J441 Chronic obstructive pulmonary disease with (acute) exacerbation: Secondary | ICD-10-CM | POA: Diagnosis not present

## 2015-12-10 DIAGNOSIS — M5432 Sciatica, left side: Secondary | ICD-10-CM | POA: Diagnosis not present

## 2015-12-10 LAB — LIPID PANEL
Cholesterol: 174 mg/dL (ref 125–200)
HDL: 89 mg/dL (ref >=46)
LDL Cholesterol: 55 mg/dL (ref <130)
Total CHOL/HDL Ratio: 2 Ratio (ref <=5.0)
Triglycerides: 150 mg/dL — ABNORMAL HIGH (ref <150)
VLDL: 30 mg/dL (ref <30)

## 2015-12-10 MED ORDER — ATORVASTATIN CALCIUM 40 MG PO TABS: ORAL_TABLET | ORAL | Status: DC

## 2015-12-10 MED ORDER — TRAMADOL HCL 50 MG PO TABS
50.0000 mg | ORAL_TABLET | Freq: Four times a day (QID) | ORAL | Status: DC | PRN
Start: 2015-12-10 — End: 2015-12-27

## 2015-12-10 MED ORDER — PREDNISONE 10 MG PO TABS: ORAL_TABLET | ORAL | Status: DC

## 2015-12-10 NOTE — Discharge Instructions (Signed)
Because you received an x-ray today, you will receive an invoice from [facility]. Please contact [facility] at [phone] with questions or concerns regarding your invoice. Our billing staff will not be able to assist you with those questions.

## 2015-12-10 NOTE — Patient Instructions (Signed)
Because you received an x-ray today, you will receive an invoice from [facility]. Please contact [facility] at [phone] with questions or concerns regarding your invoice. Our billing staff will not be able to assist you with those questions.

## 2015-12-10 NOTE — Progress Notes (Signed)
Subjective:  This chart was scribed for Tammy Lin, MD by [facility], medical scribe at [facility].The patient was seen in exam room 10 and the patient's care was started at 10:41 AM.   Patient ID: Tammy Ball, female    DOB: 13-Jan-1941, 75 y.o.   MRN: [id number] Chief Complaint  Patient presents with  . Back Pain  . Leg Pain    left side    HPI HPI Comments: Tammy Ball is a 75 y.o. female who was recently diagnosed with stage II lung cancer, squamous cell carcinoma. She has a hx spondylolisthesis and sciatica on the left side. She presents to [facility] due to a back pain and left sided leg pain. This has been ongoing for 3 weeks. She was given prednisone by her oncologist,  but since she completed this the pain has worsened and she has since developed a sharp pain at the upper thigh. This is worsened with twisting, pivoting and bearing weight on the left foot. Neuropathy in hands and feet most likely due to the chemo and this has effected her balance and gait. No bruising or swelling around the upper left thigh.  Past Medical History  Diagnosis Date  . COPD (chronic obstructive pulmonary disease) ([place])   . History of kidney cancer   . Hypertension   . Depression   . Asthma   . Glaucoma   . Hyperlipidemia----she is also here for yearly labs and statin refill   . Heart murmur   . Cancer ([place])   . Non-small cell carcinoma of lung, stage 2 ([place]) 10/03/2015  . Non-small cell carcinoma of right lung, stage 2 ([place]) 10/03/2015  . Chemotherapy-induced neuropathy ([place]) 11/01/2015   Prior to Admission medications   Medication Sig Start Date End Date Taking? Authorizing Provider  acetaminophen (TYLENOL) 500 MG tablet Take 1,000 mg by mouth every 6 (six) hours as needed for mild pain, moderate pain, fever or headache.   Yes Historical Provider, MD  albuterol (PROVENTIL HFA;VENTOLIN HFA) 108 (90 BASE) MCG/ACT inhaler Inhale 2 puffs into  the lungs every 4 (four) hours as needed for wheezing or shortness of breath. 05/18/15 09/03/16 Yes [name], MD  aspirin EC 81 MG tablet Take 81 mg by mouth daily.   Yes Historical Provider, MD  atorvastatin (LIPITOR) 40 MG tablet TAKE ONE TABLET BY MOUTH ONCE DAILY. 10/05/15  Yes [name], MD  benzonatate (TESSALON) 200 MG capsule Take 1 capsule (200 mg total) by mouth 3 (three) times daily as needed for cough. 09/13/15  Yes Hosie Poisson, MD  CALCIUM PO Take 1 tablet by mouth daily.   Yes Historical Provider, MD  Cholecalciferol (VITAMIN D-3 PO) Take 1 tablet by mouth daily.   Yes Historical Provider, MD  fish oil-omega-3 fatty acids 1000 MG capsule Take 1 g by mouth daily.     Yes Historical Provider, MD  fluticasone (FLONASE) 50 MCG/ACT nasal spray Place 2 sprays into both nostrils daily. 09/13/15  Yes Hosie Poisson, MD  ipratropium-albuterol (DUONEB) 0.5-2.5 (3) MG/3ML SOLN Take 3 mLs by nebulization 3 (three) times daily. 09/13/15  Yes Hosie Poisson, MD  LORazepam (ATIVAN) 0.5 MG tablet Take 1 tablet (0.5 mg total) by mouth 2 (two) times daily as needed for anxiety. 10/05/15  Yes [name], MD  mirabegron ER (MYRBETRIQ) 50 MG TB24 tablet Take 50 mg by mouth daily.   Yes Historical Provider, MD  mometasone-formoterol (DULERA) 200-5 MCG/ACT AERO Inhale 2  puffs into the lungs 2 (two) times daily. 11/08/15  Yes [name], MD  oxyCODONE-acetaminophen (PERCOCET/ROXICET) 5-325 MG tablet Take 1-2 tablets by mouth every 6 (six) hours as needed for severe pain. 11/22/15  Yes Curt Bears, MD  polyvinyl alcohol-povidone (REFRESH) 1.4-0.6 % ophthalmic solution Place 2 drops into both eyes daily as needed (for dry eyes).   Yes Historical Provider, MD  valsartan (DIOVAN) 80 MG tablet Take 1 tablet (80 mg total) by mouth daily. 10/31/15  Yes [name], MD  loratadine (CLARITIN) 10 MG tablet Take 10 mg by mouth daily. Reported on 12/10/2015    Historical Provider, MD    methylPREDNISolone (MEDROL DOSEPAK) 4 MG TBPK tablet Use as instructed Patient not taking: Reported on 12/10/2015 11/22/15   Curt Bears, MD  prochlorperazine (COMPAZINE) 10 MG tablet Take 1 tablet (10 mg total) by mouth every 6 (six) hours as needed for nausea or vomiting. Patient not taking: Reported on 12/10/2015 10/03/15   Curt Bears, MD  UNABLE TO [place] Name: Wig Patient not taking: Reported on 12/10/2015 11/01/15   Curt Bears, MD   Allergies  Allergen Reactions  . Bee Venom Anaphylaxis, Shortness Of Breath and Swelling    Swelling at site   . Levofloxacin Other (See Comments)    Joint pain   . Amlodipine Swelling    Swelling of the ankles and hands   . Hctz [Hydrochlorothiazide] Palpitations and Other (See Comments)    Sweating    Review of Systems  Musculoskeletal: Positive for myalgias, back pain and gait problem.  no fever chills ns Wt stable No gu sxt    No current COPD sxt Objective:  BP 122/72 mmHg  Pulse 88  Temp(Src) 98.5 F (36.9 C) (Oral)  Resp 17  Ht 5' 4.5" (1.638 m)  Wt 177 lb (80.287 kg)  BMI 29.92 kg/m2  SpO2 97% Physical Exam  Constitutional: She is oriented to person, place, and time. She appears well-developed and well-nourished. No distress.  HENT:  Head: Normocephalic and atraumatic.  Eyes: Pupils are equal, round, and reactive to light.  Neck: Normal range of motion.  Cardiovascular: Normal rate and regular rhythm.   Pulmonary/Chest: Effort normal. No respiratory distress.  Musculoskeletal: Normal range of motion.  Tender over the lumbar spine and the left SI area to palpation. She has pain in the left SI with straight left raise to 90 degrees. TTP over the greater trochanter and left IP band. TTP over the anterior left thigh exaggerated with ROM. No masses or tenderness in the posterior thigh.   Neurological: She is alert and oriented to person, place, and time.  Skin: Skin is warm and dry.  Psychiatric: She has a normal mood  and affect. Her behavior is normal.  Nursing note and vitals reviewed. [facility] reading (PRIMARY) by Dr. Laney Pastor no bony defects or evidence of metastasis in the left femur.       Assessment & Plan:  Sciatica of left side Spondylolisthesis of lumbar region  Hyperlipidemia - Plan: Lipid panel  Non-small cell carcinoma of right lung, stage 2 (HCC)  Pain of left leg/anterior thigh -tender itband L  COPD   Meds ordered this encounter  Medications  . predniSONE (DELTASONE) 10 MG tablet    Sig: Take 4 daily for 3 days 3 a day for 3 days 2 a day for 3 days one a day for 3 days    Dispense:  30 tablet    Refill:  0  . traMADol (ULTRAM) 50 MG  tablet    Sig: Take 1-2 tablets (50-100 mg total) by mouth every 6 (six) hours as needed.    Dispense:  60 tablet    Refill:  2  . atorvastatin (LIPITOR) 40 MG tablet    Sig: TAKE ONE TABLET BY MOUTH ONCE DAILY.    Dispense:  90 tablet    Refill:  3     By signing my name below, I, Nadim Abuhashem, attest that this documentation has been prepared under the direction and in the presence of Tammy Lin, MD.  Electronically Signed: Lora Havens, medical scribe. 12/10/2015, 10:57 AM. I have completed the patient encounter in its entirety as documented by the scribe, with editing by me where necessary. Ednah Hammock P. Laney Pastor, M.D.

## 2015-12-12 ENCOUNTER — Other Ambulatory Visit: Payer: Self-pay | Admitting: Medical Oncology

## 2015-12-13 ENCOUNTER — Encounter (HOSPITAL_COMMUNITY): Payer: Self-pay

## 2015-12-13 ENCOUNTER — Other Ambulatory Visit: Payer: Medicare Other

## 2015-12-13 ENCOUNTER — Ambulatory Visit (HOSPITAL_COMMUNITY)
Admission: RE | Admit: 2015-12-13 | Discharge: 2015-12-13 | Disposition: A | Discharge status: Home / Self Care | Payer: Medicare Other | Source: Ambulatory Visit | Attending: Internal Medicine | Admitting: Internal Medicine

## 2015-12-13 DIAGNOSIS — M4316 Spondylolisthesis, lumbar region: Secondary | ICD-10-CM | POA: Insufficient documentation

## 2015-12-13 DIAGNOSIS — G620 Drug-induced polyneuropathy: Secondary | ICD-10-CM | POA: Diagnosis not present

## 2015-12-13 DIAGNOSIS — I517 Cardiomegaly: Secondary | ICD-10-CM | POA: Insufficient documentation

## 2015-12-13 DIAGNOSIS — C3491 Malignant neoplasm of unspecified part of right bronchus or lung: Secondary | ICD-10-CM | POA: Diagnosis present

## 2015-12-13 DIAGNOSIS — M5432 Sciatica, left side: Secondary | ICD-10-CM | POA: Diagnosis not present

## 2015-12-13 DIAGNOSIS — T451X5A Adverse effect of antineoplastic and immunosuppressive drugs, initial encounter: Secondary | ICD-10-CM | POA: Insufficient documentation

## 2015-12-13 DIAGNOSIS — I700 Atherosclerosis of aorta: Secondary | ICD-10-CM | POA: Insufficient documentation

## 2015-12-13 DIAGNOSIS — Z85528 Personal history of other malignant neoplasm of kidney: Secondary | ICD-10-CM | POA: Diagnosis not present

## 2015-12-13 DIAGNOSIS — J432 Centrilobular emphysema: Secondary | ICD-10-CM | POA: Insufficient documentation

## 2015-12-13 DIAGNOSIS — Z905 Acquired absence of kidney: Secondary | ICD-10-CM | POA: Diagnosis not present

## 2015-12-13 MED ORDER — IOHEXOL 300 MG/ML  SOLN
75.0000 mL | Freq: Once | INTRAMUSCULAR | Status: AC | PRN
  Administered 2015-12-13: 75 mL via INTRAVENOUS

## 2015-12-20 ENCOUNTER — Encounter: Payer: Self-pay | Admitting: Internal Medicine

## 2015-12-20 ENCOUNTER — Telehealth: Payer: Self-pay | Admitting: *Deleted

## 2015-12-20 ENCOUNTER — Encounter (HOSPITAL_COMMUNITY): Payer: Self-pay

## 2015-12-20 ENCOUNTER — Encounter: Payer: Self-pay | Admitting: *Deleted

## 2015-12-20 ENCOUNTER — Ambulatory Visit (HOSPITAL_BASED_OUTPATIENT_CLINIC_OR_DEPARTMENT_OTHER): Payer: Medicare Other | Admitting: Internal Medicine

## 2015-12-20 DIAGNOSIS — C3411 Malignant neoplasm of upper lobe, right bronchus or lung: Secondary | ICD-10-CM

## 2015-12-20 DIAGNOSIS — G622 Polyneuropathy due to other toxic agents: Secondary | ICD-10-CM

## 2015-12-20 DIAGNOSIS — R5383 Other fatigue: Secondary | ICD-10-CM | POA: Diagnosis not present

## 2015-12-20 DIAGNOSIS — C3491 Malignant neoplasm of unspecified part of right bronchus or lung: Secondary | ICD-10-CM

## 2015-12-20 NOTE — Telephone Encounter (Signed)
Oncology Nurse Navigator Documentation  Oncology Nurse Navigator Flowsheets 12/20/2015  Navigator Location [facility]  Navigator Encounter Type Telephone/per Dr. Julien Nordmann I called Tammy Ball to set up for [place].  I left vm message to call me with my name and phone number  Telephone Outgoing Call  Patient Visit Type Follow-up  Treatment Phase Treatment  Barriers/Navigation Needs Coordination of Care  Education -  Interventions Coordination of Care  Coordination of Care Appts  Acuity Level 1  Time Spent with Patient 30

## 2015-12-20 NOTE — Progress Notes (Signed)
Patient called me back.  I updated her on her appt at [facility] to see thoracic surgery on 12/22/15.  She verbalized understanding of appt time and place.

## 2015-12-20 NOTE — Progress Notes (Signed)
[place] [phone]   Fax:([phone]) [place], MD [address] [place] [address]  DIAGNOSIS: Stage IIA (T2a, N1, M0) non-small cell lung cancer, squamous cell carcinoma presented with large right upper lobe lung mass with right hilar involvement diagnosed in November 2016.  PRIOR THERAPY: Neoadjuvant systemic chemotherapy with carboplatin for AUC of 6 and paclitaxel 200 MG/M2 every 3 weeks, status post 3 cycles with partial response.   CURRENT THERAPY: None.  INTERVAL HISTORY: Tammy Ball 75 y.o. female returns to the clinic today for follow-up visit accompanied by her son and daughter. The patient tolerated the last cycle of her systemic chemotherapy fairly well. She continues to have mild fatigue. She denied having any significant weight loss or night sweats. She still has mild peripheral neuropathy which is better. She denied having any nausea or vomiting, no fever or chills. She has no significant chest pain, shortness of breath, cough or hemoptysis. She has repeat CT scan of the chest performed recently and she is here for evaluation and discussion of her scan results.  MEDICAL HISTORY: Past Medical History  Diagnosis Date  . COPD (chronic obstructive pulmonary disease) ([place])   . History of kidney cancer   . Hypertension   . Depression   . Asthma   . Glaucoma   . Hyperlipidemia   . Heart murmur   . Cancer ([place])   . Non-small cell carcinoma of lung, stage 2 ([place]) 10/03/2015  . Non-small cell carcinoma of right lung, stage 2 ([place]) 10/03/2015  . Chemotherapy-induced neuropathy ([place]) 11/01/2015    ALLERGIES:  is allergic to bee venom; levofloxacin; amlodipine; and hctz.  MEDICATIONS:  Current Outpatient Prescriptions  Medication Sig Dispense Refill  . acetaminophen (TYLENOL) 500 MG tablet Take 1,000 mg by mouth every 6 (six) hours as needed for mild pain, moderate pain, fever or headache.    .  albuterol (PROVENTIL HFA;VENTOLIN HFA) 108 (90 BASE) MCG/ACT inhaler Inhale 2 puffs into the lungs every 4 (four) hours as needed for wheezing or shortness of breath. 1 Inhaler 0  . aspirin EC 81 MG tablet Take 81 mg by mouth daily.    Marland Kitchen atorvastatin (LIPITOR) 40 MG tablet TAKE ONE TABLET BY MOUTH ONCE DAILY. 90 tablet 3  . benzonatate (TESSALON) 200 MG capsule Take 1 capsule (200 mg total) by mouth 3 (three) times daily as needed for cough. 20 capsule 0  . CALCIUM PO Take 1 tablet by mouth daily.    . Cholecalciferol (VITAMIN D-3 PO) Take 1 tablet by mouth daily.    . fish oil-omega-3 fatty acids 1000 MG capsule Take 1 g by mouth daily.      . fluticasone (FLONASE) 50 MCG/ACT nasal spray Place 2 sprays into both nostrils daily. 9.9 g 0  . ipratropium-albuterol (DUONEB) 0.5-2.5 (3) MG/3ML SOLN Take 3 mLs by nebulization 3 (three) times daily. 360 mL 1  . loratadine (CLARITIN) 10 MG tablet Take 10 mg by mouth daily. Reported on 12/10/2015    . LORazepam (ATIVAN) 0.5 MG tablet Take 1 tablet (0.5 mg total) by mouth 2 (two) times daily as needed for anxiety. 10 tablet 1  . methylPREDNISolone (MEDROL DOSEPAK) 4 MG TBPK tablet Use as instructed 21 tablet 0  . mirabegron ER (MYRBETRIQ) 50 MG TB24 tablet Take 50 mg by mouth daily.    . mometasone-formoterol (DULERA) 200-5 MCG/ACT AERO Inhale 2 puffs into the lungs 2 (two) times daily. 3 Inhaler 0  .  oxyCODONE-acetaminophen (PERCOCET/ROXICET) 5-325 MG tablet Take 1-2 tablets by mouth every 6 (six) hours as needed for severe pain. 45 tablet 0  . polyvinyl alcohol-povidone (REFRESH) 1.4-0.6 % ophthalmic solution Place 2 drops into both eyes daily as needed (for dry eyes).    . predniSONE (DELTASONE) 10 MG tablet Take 4 daily for 3 days 3 a day for 3 days 2 a day for 3 days one a day for 3 days 30 tablet 0  . prochlorperazine (COMPAZINE) 10 MG tablet Take 1 tablet (10 mg total) by mouth every 6 (six) hours as needed for nausea or vomiting. 30 tablet 0  .  traMADol (ULTRAM) 50 MG tablet Take 1-2 tablets (50-100 mg total) by mouth every 6 (six) hours as needed. 60 tablet 2  . UNABLE TO FIND Med Name: Wig 1 each 0  . valsartan (DIOVAN) 80 MG tablet Take 1 tablet (80 mg total) by mouth daily. 90 tablet 0   No current facility-administered medications for this visit.    SURGICAL HISTORY:  Past Surgical History  Procedure Laterality Date  . Nephrectomy    . Kidney cancer    . Tubal ligation    . Spine surgery    . Eye surgery    . Video bronchoscopy Bilateral 09/20/2015    Procedure: VIDEO BRONCHOSCOPY WITHOUT FLUORO;  Surgeon: Rigoberto Noel, MD;  Location: [facility];  Service: Cardiopulmonary;  Laterality: Bilateral;    REVIEW OF SYSTEMS:  Constitutional: positive for fatigue Eyes: negative Ears, nose, mouth, throat, and face: negative Respiratory: negative Cardiovascular: negative Gastrointestinal: negative Genitourinary:negative Integument/breast: negative Hematologic/lymphatic: negative Musculoskeletal:negative Neurological: positive for paresthesia Behavioral/Psych: negative Endocrine: negative Allergic/Immunologic: negative   PHYSICAL EXAMINATION: General appearance: alert, cooperative, fatigued and no distress Head: Normocephalic, without obvious abnormality, atraumatic Neck: no adenopathy, no JVD, supple, symmetrical, trachea midline and thyroid not enlarged, symmetric, no tenderness/mass/nodules Lymph nodes: Cervical, supraclavicular, and axillary nodes normal. Resp: clear to auscultation bilaterally Back: symmetric, no curvature. ROM normal. No CVA tenderness. Cardio: regular rate and rhythm, S1, S2 normal, no murmur, click, rub or gallop GI: soft, non-tender; bowel sounds normal; no masses,  no organomegaly Extremities: extremities normal, atraumatic, no cyanosis or edema Neurologic: Alert and oriented X 3, normal strength and tone. Normal symmetric reflexes. Normal coordination and gait  ECOG PERFORMANCE STATUS: 1  - Symptomatic but completely ambulatory  There were no vitals taken for this visit.  LABORATORY DATA: Lab Results  Component Value Date   WBC 12.0* 12/06/2015   HGB 9.1* 12/06/2015   HCT 28.0* 12/06/2015   MCV 95.3 12/06/2015   PLT 92* 12/06/2015      Chemistry      Component Value Date/Time   NA 139 12/06/2015 0928   NA 138 09/20/2015 0550   K 5.3* 12/06/2015 0928   K 4.4 09/20/2015 0550   CL 101 09/20/2015 0550   CO2 25 12/06/2015 0928   CO2 31 09/20/2015 0550   BUN 24.2 12/06/2015 0928   BUN 18 09/20/2015 0550   BUN 24* 12/11/2011   CREATININE 1.0 12/06/2015 0928   CREATININE 1.04* 09/20/2015 0550   CREATININE 1.08 12/02/2013 0911   GLU 105 12/10/2009      Component Value Date/Time   CALCIUM 9.5 12/06/2015 0928   CALCIUM 9.5 09/20/2015 0550   ALKPHOS 136 12/06/2015 0928   ALKPHOS 70 09/19/2015 0755   AST 19 12/06/2015 0928   AST 16 09/19/2015 0755   ALT 25 12/06/2015 0928   ALT 17 09/19/2015 0755   BILITOT <  0.30 12/06/2015 0928   BILITOT 0.9 09/19/2015 0755       RADIOGRAPHIC STUDIES: Ct Chest W Contrast  12/13/2015  CLINICAL DATA:  Non-small-cell lung cancer, right-sided. Left nephrectomy for renal cell carcinoma in 2012. Chemotherapy finishing 11/22/2015. Restaging. EXAM: CT CHEST WITH CONTRAST TECHNIQUE: Multidetector CT imaging of the chest was performed during intravenous contrast administration. CONTRAST:  66m OMNIPAQUE IOHEXOL 300 MG/ML  SOLN COMPARISON:  PET 09/29/2015.  Chest CT 09/19/2015. FINDINGS: Mediastinum/Nodes: No supraclavicular adenopathy. Aortic and branch vessel atherosclerosis. Mild cardiomegaly with mild lipomatous hypertrophy of the interatrial septum. No central pulmonary embolism, on this non-dedicated study. No mediastinal or hilar adenopathy. No residual extension into the right hilum. Lungs/Pleura: No pleural fluid. Mild centrilobular emphysema. Mild biapical scarring. Significant decrease in size of the central right lower lobe lung  lesion. This measures on the order of 2.5 x 2.3 cm on image 18/ series 5. Compare 3.6 x 4.5 cm on 09/19/2015. Significantly greater cavitation today. Clear left lung. Upper abdomen: Normal imaged portions of the liver, spleen, stomach, pancreas, gallbladder, adrenal glands. Left nephrectomy. Normal imaged right kidney. Musculoskeletal: Cervical spine fixation. IMPRESSION: 1. Marked response to therapy. Significant decrease in size of a central right upper lobe pulmonary nodule with increase in cavitation. 2. No evidence of thoracic adenopathy to suggest metastatic disease. 3. Left nephrectomy. Electronically Signed   By: KAbigail MiyamotoM.D.   On: 12/13/2015 12:00   Dg Femur Min 2 Views Left  12/10/2015  CLINICAL DATA:  Left leg pain, history of lung carcinoma EXAM: LEFT FEMUR 2 VIEWS COMPARISON:  None. FINDINGS: There is no evidence of fracture or other focal bone lesions. Soft tissues are unremarkable. IMPRESSION: No acute abnormality noted. Electronically Signed   By: MInez CatalinaM.D.   On: 12/10/2015 13:04    ASSESSMENT AND PLAN: This is a very pleasant 75years old white female recently diagnosed with stage II a non-small cell lung cancer, squamous cell carcinoma and completed neoadjuvant systemic chemotherapy with carboplatin and paclitaxel status post 3 cycles.  The recent CT scan of the chest showed partial response response with significant decrease in the size of the central right upper lobe pulmonary nodule and increased cavitation. There was no evidence for thoracic adenopathy. I discussed the scan results with the patient and her family. I recommended for her to see thoracic surgery for consideration of surgical resection. She agreed to this option. I will arrange for the patient and appointment with thoracic surgery. I will see her back for follow-up visit after her surgical resection. I gave the patient and her family the time to ask questions and I answered them completely to their  satisfaction. She was advised to call immediately if she has any concerning symptoms in the interval. The patient voices understanding of current disease status and treatment options and is in agreement with the current care plan.  All questions were answered. The patient knows to call the clinic with any problems, questions or concerns. We can certainly see the patient much sooner if necessary.  Disclaimer: This note was dictated with voice recognition software. Similar sounding words can inadvertently be transcribed and may not be corrected upon review.

## 2015-12-21 ENCOUNTER — Telehealth: Payer: Self-pay | Admitting: *Deleted

## 2015-12-21 NOTE — Telephone Encounter (Signed)
Called and confirmed 12/22/15 clinic appt w/ pt.

## 2015-12-22 ENCOUNTER — Institutional Professional Consult (permissible substitution) (INDEPENDENT_AMBULATORY_CARE_PROVIDER_SITE_OTHER): Payer: Medicare Other | Admitting: Cardiothoracic Surgery

## 2015-12-22 ENCOUNTER — Ambulatory Visit: Payer: Medicare Other | Attending: Cardiothoracic Surgery | Admitting: Physical Therapy

## 2015-12-22 VITALS — BP 153/57 | HR 88 | Temp 98.3°F | Resp 17 | Ht 64.0 in | Wt 180.5 lb

## 2015-12-22 DIAGNOSIS — M5442 Lumbago with sciatica, left side: Secondary | ICD-10-CM | POA: Diagnosis not present

## 2015-12-22 DIAGNOSIS — C3411 Malignant neoplasm of upper lobe, right bronchus or lung: Secondary | ICD-10-CM | POA: Diagnosis not present

## 2015-12-22 DIAGNOSIS — R269 Unspecified abnormalities of gait and mobility: Secondary | ICD-10-CM | POA: Insufficient documentation

## 2015-12-22 DIAGNOSIS — R0602 Shortness of breath: Secondary | ICD-10-CM | POA: Diagnosis not present

## 2015-12-22 NOTE — Therapy (Signed)
[place], [place], [address] Phone: [phone]   Fax:  [phone]  Physical Therapy Evaluation  Patient Details  Name: Tammy Ball MRN: [id number] Date of Birth: [birth year] Referring Provider: Dr. Lanelle Bal  Encounter Date: 12/22/2015      PT End of Session - 12/22/15 1809    Visit Number 1   Number of Visits 1   PT Start Time 7858   PT Stop Time 1410   PT Time Calculation (min) 1350 min   Activity Tolerance Patient tolerated treatment well   Behavior During Therapy [facility] for tasks assessed/performed      Past Medical History  Diagnosis Date  . COPD (chronic obstructive pulmonary disease) ([place])   . History of kidney cancer   . Hypertension   . Depression   . Asthma   . Glaucoma   . Hyperlipidemia   . Heart murmur   . Cancer ([place])   . Non-small cell carcinoma of lung, stage 2 ([place]) 10/03/2015  . Non-small cell carcinoma of right lung, stage 2 ([place]) 10/03/2015  . Chemotherapy-induced neuropathy ([place]) 11/01/2015    Past Surgical History  Procedure Laterality Date  . Nephrectomy    . Kidney cancer    . Tubal ligation    . Spine surgery    . Eye surgery    . Video bronchoscopy Bilateral 09/20/2015    Procedure: VIDEO BRONCHOSCOPY WITHOUT FLUORO;  Surgeon: Rigoberto Noel, MD;  Location: [facility];  Service: Cardiopulmonary;  Laterality: Bilateral;    There were no vitals filed for this visit.  Visit Diagnosis:  Left-sided low back pain with left-sided sciatica - Plan: PT plan of care cert/re-cert  Shortness of breath on exertion - Plan: PT plan of care cert/re-cert  Gait disturbance - Plan: PT plan of care cert/re-cert      Subjective Assessment - 12/22/15 1757    Subjective wants to be feel well enough to do things in her life; c/o neuropathy in her feet affecting her walking; c/o low energy   Pertinent History Patient was treated with chemotherapy for non-small cell  lung cancer, stage IIA, and tumor shrank so that she may now be able to have it resected.  She will have testing to check her viability as a surgical candidate.  Ex-smoker who quit 2012; h/o kidney cancer, s/p nephrectomy.   Patient Stated Goals get info from all lung clinic providers   Currently in Pain? Yes   Pain Score 3    Pain Location Leg   Pain Orientation Left   Aggravating Factors  standing too long   Pain Relieving Factors prednisone (currently on this)   Effect of Pain on Daily Activities interrupts sleep            [facility] PT Assessment - 12/22/15 0001    Assessment   Medical Diagnosis lung squamous cell carcinoma   Referring Provider Dr. Lanelle Bal   Onset Date/Surgical Date 09/17/15  approx.   Prior Therapy chemotherapy   Precautions   Precautions Other (comment)   Precaution Comments cancer   Restrictions   Weight Bearing Restrictions No   Balance Screen   Has the patient fallen in the past 6 months No   Has the patient had a decrease in activity level because of a fear of falling?  No   Is the patient reluctant to leave their home because of a fear of falling?  No   Home Ecologist  residence   [place] Multi-level;Able to live on main level with bedroom/bathroom   Prior Function   Level of Independence Independent   Vocation Full time employment   Vocation Requirements substance abuse counselor; sedentary work   Leisure hasn't had the energy to exercise   Cognition   Overall Cognitive Status Within Functional Limits for tasks assessed   Functional Tests   Functional tests Sit to Stand   Sit to Stand   Comments 13 times in 30 seconds; moderate dyspnea and leg fatigue   Posture/Postural Control   Posture/Postural Control Postural limitations   Postural Limitations Forward head  mild   ROM / Strength   AROM / PROM / Strength AROM   AROM   Overall AROM Comments trunk  AROM in standing:  WFL all motions except extension limited 50% by low back pain   Ambulation/Gait   Ambulation/Gait Yes   Ambulation/Gait Assistance 7: Independent   Gait Comments says she has to remind herself "heel-toe, heel-toe" because of numbness from CIPN; gets SOB with vacuuming, making the bed   Balance   Balance Assessed Yes   Dynamic Standing Balance   Dynamic Standing - Comments reaches 13 inches forward in standing                           PT Education - Dec 31, 2015 1808    Education provided Yes   Education Details posture, breathing, energy conservation, walking or other exercise, staying active, cough splinting, PT info   Person(s) Educated Patient   Methods Explanation;Handout   Comprehension Verbalized understanding               Lung Clinic Goals - Dec 31, 2015 1814    Patient will be able to verbalize understanding of the benefit of exercise to decrease fatigue.   Status Achieved   Patient will be able to verbalize the importance of posture.   Status Achieved   Patient will be able to demonstrate diaphragmatic breathing for improved lung function.   Status Achieved   Patient will be able to verbalize understanding of the role of physical therapy to prevent functional decline and who to contact if physical therapy is needed.   Status Achieved             Plan - 2015-12-31 1809    Clinical Impression Statement Pleasant woman who has had a lung tumor shrink by one half with chemotherapy since November, now may undergo resection.  She c/o fatigue and has moderate dyspnea with activity; also c/o neuropathy affecting her gait.  She also has left leg pain stemming from her back.   Pt will benefit from skilled therapeutic intervention in order to improve on the following deficits Cardiopulmonary status limiting activity;Decreased endurance;Pain;Decreased balance;Decreased activity tolerance   Rehab Potential Good   PT Frequency One time visit    PT Treatment/Interventions Patient/family education   PT Next Visit Plan None at this time, but patient would benefit from therapy after resection if she has it, to work on balance, gait, endurance.   PT Home Exercise Plan see education section   Consulted and Agree with Plan of Care Patient          G-Codes - 12/31/15 1814    Functional Assessment Tool Used clinical judgement   Functional Limitation Mobility: Walking and moving around   Mobility: Walking and Moving Around Current Status ([id number]) At least 40  percent but less than 60 percent impaired, limited or restricted   Mobility: Walking and Moving Around Goal Status [phone]) At least 40 percent but less than 60 percent impaired, limited or restricted   Mobility: Walking and Moving Around Discharge Status [phone]) At least 40 percent but less than 60 percent impaired, limited or restricted       Problem List Patient Active Problem List   Diagnosis Date Noted  . Chemotherapy-induced neuropathy ([place]) 11/01/2015  . Non-small cell carcinoma of right lung, stage 2 ([place]) 10/03/2015  . Morbid obesity ([place]) 10/01/2015  . Lung mass 09/19/2015  . CAP (community acquired pneumonia) 09/10/2015  . Anemia 09/10/2015  . Acute renal failure ([place]) 09/10/2015  . Sepsis ([place]) 09/08/2015  . Hyperlipidemia 08/14/2013  . Spondylolisthesis of lumbar region 07/03/2013  . Glaucoma 10/23/2012  . Essential hypertension 12/17/2011  . COPD GOLD II  12/17/2011  . Hearing loss 12/17/2011  . Renal cell cancer ([place]) 12/17/2011  . Sciatica of left side 12/17/2011  . Depression 12/17/2011    SALISBURY,DONNA 12/22/2015, 6:17 PM  [place], [place], [address] Phone: [phone]   Fax:  [phone]  Name: Tammy Ball MRN: [id number] Date of Birth: [birth year]   Serafina Royals, PT 12/22/2015 6:17 PM

## 2015-12-22 NOTE — Progress Notes (Signed)
[address]       [place], [address]             [phone]                      Tammy Ball Medical Record #[id number] Date of Birth: [birth year]  Referring: Curt Bears, MD Primary Care: [name], MD  Chief Complaint:   No chief complaint on file.   History of Present Illness:    STEPHENI Ball 75 y.o. female is seen in the office  today for diagnosis of Lung Cancer., she has now completed 3 cycles of chemo. Squamous cell with large right upper lobe mass diagnose nov 2016  She has history   h/o copd, frequent sinus infections, hypertension, She present to ER in Nov  with 2 days of fevers and chills, sob and cough. On arrival to ED, she was febrile, tachycardic, hypotensive, hypoxic and witht elevated lactic acid.  She was found to have pneumonia on CXR. She was referred to medical service for sepsis from CAP. Bronchoscopy by Dr Elsworth Soho revealed whitish endobrochial mass seen blocking entry of RUl bronchus.      DIAGNOSIS: Stage IIA (T2a, N1, M0) non-small cell lung cancer, squamous cell carcinoma presented with large right upper lobe lung mass with right hilar involvement diagnosed in November 2016.  PRIOR THERAPY: Neoadjuvant systemic chemotherapy with carboplatin for AUC of 6 and paclitaxel 200 MG/M2 every 3 weeks, status post 3 cycles with partial response.   CURRENT THERAPY: None.   Bronchoscopy by Dr Elsworth Soho Airway entered and the following bronchi were examined: Bronchi. Thick white purulent secretions removed. bscope had to be removed due to thick plug & reintroduced. .  Procedures performed: Endobronchial Biopsies x 4 , brushings x 2 & BAL were obtained fromt his area Bronchoscope removed.  Current Activity/ Functional Status:  Patient is independent with mobility/ambulation, transfers, ADL's, IADL's.   Zubrod Score: At the time of surgery this patient's most appropriate activity status/level should be described  as: '[]'$     0    Normal activity, no symptoms '[x]'$     1    Restricted in physical strenuous activity but ambulatory, able to do out light work '[]'$     2    Ambulatory and capable of self care, unable to do work activities, up and about               >50 % of waking hours                              '[]'$     3    Only limited self care, in bed greater than 50% of waking hours '[]'$     4    Completely disabled, no self care, confined to bed or chair '[]'$     5    Moribund   Past Medical History  Diagnosis Date  . COPD (chronic obstructive pulmonary disease) ([place])   . History of kidney cancer   . Hypertension   . Depression   . Asthma   . Glaucoma   . Hyperlipidemia   . Heart murmur   . Cancer ([place])   . Non-small cell carcinoma of lung, stage 2 ([place]) 10/03/2015  . Non-small cell carcinoma of right lung, stage 2 ([place]) 10/03/2015  . Chemotherapy-induced neuropathy ([place]) 11/01/2015    Past Surgical History  Procedure Laterality Date  .  Nephrectomy    . Kidney cancer    . Tubal ligation    . Spine surgery    . Eye surgery    . Video bronchoscopy Bilateral 09/20/2015    Procedure: VIDEO BRONCHOSCOPY WITHOUT FLUORO;  Surgeon: Rigoberto Noel, MD;  [facility]: [facility];  Service: Cardiopulmonary;  Laterality: Bilateral;    Family History  Problem Relation Age of Onset  . Heart disease Sister   . Obesity Brother   . Heart disease Daughter   . Glaucoma Daughter   . Breast cancer Sister   . Birth defects Sister   . Cancer Mother     Bladder Cancer  . Hypertension Mother   . Heart disease Mother   . Cancer Maternal Grandmother     Social History   Social History  . Marital Status: Widowed    Spouse Name: N/A  . Number of Children: N/A  . Years of Education: N/A   Occupational History  . Not on file.   Social History Main Topics  . Smoking status: Former Smoker -- 0.50 packs/day for 50 years    Types: Cigarettes    Quit date: 03/16/2011  . Smokeless tobacco: Never Used  .  Alcohol Use: No  . Drug Use: No  . Sexual Activity: Not Currently   Other Topics Concern  . Not on file   Social History Narrative   Occupation: Substance Abuse Counselor   Divorced   College Education   No exercise** Merged History Encounter **       ** Data from: 12/14/11 Enc Dept: [facility]       ** Data from: 12/17/11 Enc Dept: [facility]   Substance abuse counselor   Husband deceased   4 great grandchildren   Son works in same substance abuse counseling center as patient    History  Smoking status  . Former Smoker -- 0.50 packs/day for 50 years  . Types: Cigarettes  . Quit date: 03/16/2011  Smokeless tobacco  . Never Used    History  Alcohol Use No     Allergies  Allergen Reactions  . Bee Venom Anaphylaxis, Shortness Of Breath and Swelling    Swelling at site   . Levofloxacin Other (See Comments)    Joint pain   . Amlodipine Swelling    Swelling of the ankles and hands   . Hctz [Hydrochlorothiazide] Palpitations and Other (See Comments)    Sweating     Current Outpatient Prescriptions  Medication Sig Dispense Refill  . acetaminophen (TYLENOL) 500 MG tablet Take 1,000 mg by mouth every 6 (six) hours as needed for mild pain, moderate pain, fever or headache.    . albuterol (PROVENTIL HFA;VENTOLIN HFA) 108 (90 BASE) MCG/ACT inhaler Inhale 2 puffs into the lungs every 4 (four) hours as needed for wheezing or shortness of breath. 1 Inhaler 0  . aspirin EC 81 MG tablet Take 81 mg by mouth daily.    Marland Kitchen atorvastatin (LIPITOR) 40 MG tablet TAKE ONE TABLET BY MOUTH ONCE DAILY. 90 tablet 3  . benzonatate (TESSALON) 200 MG capsule Take 1 capsule (200 mg total) by mouth 3 (three) times daily as needed for cough. 20 capsule 0  . CALCIUM PO Take 1 tablet by mouth daily.    . Cholecalciferol (VITAMIN D-3 PO) Take 1 tablet by mouth daily.    . fish oil-omega-3 fatty acids 1000 MG capsule Take 1 g by mouth daily.      . fluticasone (FLONASE)  50 MCG/ACT  nasal spray Place 2 sprays into both nostrils daily. 9.9 g 0  . ipratropium-albuterol (DUONEB) 0.5-2.5 (3) MG/3ML SOLN Take 3 mLs by nebulization 3 (three) times daily. 360 mL 1  . loratadine (CLARITIN) 10 MG tablet Take 10 mg by mouth daily. Reported on 12/10/2015    . LORazepam (ATIVAN) 0.5 MG tablet Take 1 tablet (0.5 mg total) by mouth 2 (two) times daily as needed for anxiety. 10 tablet 1  . methylPREDNISolone (MEDROL DOSEPAK) 4 MG TBPK tablet Use as instructed 21 tablet 0  . mirabegron ER (MYRBETRIQ) 50 MG TB24 tablet Take 50 mg by mouth daily.    . mometasone-formoterol (DULERA) 200-5 MCG/ACT AERO Inhale 2 puffs into the lungs 2 (two) times daily. 3 Inhaler 0  . oxyCODONE-acetaminophen (PERCOCET/ROXICET) 5-325 MG tablet Take 1-2 tablets by mouth every 6 (six) hours as needed for severe pain. 45 tablet 0  . polyvinyl alcohol-povidone (REFRESH) 1.4-0.6 % ophthalmic solution Place 2 drops into both eyes daily as needed (for dry eyes).    . predniSONE (DELTASONE) 10 MG tablet Take 4 daily for 3 days 3 a day for 3 days 2 a day for 3 days one a day for 3 days 30 tablet 0  . prochlorperazine (COMPAZINE) 10 MG tablet Take 1 tablet (10 mg total) by mouth every 6 (six) hours as needed for nausea or vomiting. 30 tablet 0  . traMADol (ULTRAM) 50 MG tablet Take 1-2 tablets (50-100 mg total) by mouth every 6 (six) hours as needed. 60 tablet 2  . UNABLE TO FIND Med Name: Wig 1 each 0  . valsartan (DIOVAN) 80 MG tablet Take 1 tablet (80 mg total) by mouth daily. 90 tablet 0   No current facility-administered medications for this visit.      Review of Systems:     Cardiac Review of Systems: Y or N  Chest Pain [  n  ]  Resting SOB [ y  ] Exertional SOB  [ y ]  Orthopnea [ y ]   Pedal Edema [n   ]    Palpitations [n  ] Syncope  [ n ]   Presyncope [ n  ]  General Review of Systems: [Y] = yes [  ]=no Constitional: recent weight change [ n];  Wt loss over the last 3 months [   ] anorexia [  ]; fatigue Blue.Reese   ]; nausea [  ]; night sweats [  ]; fever [  ]; or chills [  ];          Dental: poor dentition[  ]; Last Dentist visit:   Eye : blurred vision [  ]; diplopia [   ]; vision changes [  ];  Amaurosis fugax[  ]; Resp: cough Blue.Reese  ];  wheezing[ y ];  hemoptysis[n  ]; shortness of breath[ y ]; paroxysmal nocturnal dyspnea[ y ]; dyspnea on exertion[  ]; or orthopnea[  ];  GI:  gallstones[  ], vomiting[  ];  dysphagia[  ]; melena[  ];  hematochezia [  ]; heartburn[  ];   Hx of  Colonoscopy[  ]; GU: kidney stones [  ]; hematuria[  ];   dysuria [  ];  nocturia[  ];  history of     obstruction [  ]; urinary frequency [  ]             Skin: rash, swelling[  ];, hair loss[  ];  peripheral edema[  ];  or itching[  ];  Musculosketetal: myalgias[  ];  joint swelling[  ];  joint erythema[  ];  joint pain[  ];  back pain[  ];  Heme/Lymph: bruising[  ];  bleeding[  ];  anemia[  ];  Neuro: TIA[n  ];  headaches[  ];  stroke[  ];  vertigo[  ];  seizures[n  ];   paresthesias[  ];  difficulty walking[  ];  Psych:depression[  ]; anxiety[  ];  Endocrine: diabetes[  ];  thyroid dysfunction[  ];  Immunizations: Flu up to date [  ]; Pneumococcal up to date [  ];  Other:  Physical Exam: BP 153/57 mmHg  Pulse 88  Temp(Src) 98.3 F (36.8 C)  Resp 17  Ht '5\' 4"'$  (1.626 m)  Wt 180 lb 8 oz (81.874 kg)  BMI 30.97 kg/m2  SpO2 95%  PHYSICAL EXAMINATION: General appearance: alert and cooperative Head: Normocephalic, without obvious abnormality, atraumatic Neck: no adenopathy, no carotid bruit, no JVD, supple, symmetrical, trachea midline and thyroid not enlarged, symmetric, no tenderness/mass/nodules Lymph nodes: Cervical, supraclavicular, and axillary nodes normal. Resp: diminished breath sounds bibasilar Back: symmetric, no curvature. ROM normal. No CVA tenderness. Cardio: regular rate and rhythm, S1, S2 normal, no murmur, click, rub or gallop GI: soft, non-tender; bowel sounds normal; no masses,  no  organomegaly Extremities: extremities normal, atraumatic, no cyanosis or edema and Homans sign is negative, no sign of DVT Neurologic: Grossly normal  Diagnostic Studies & Laboratory data:     Recent Radiology Findings:   Ct Chest W Contrast  12/13/2015  CLINICAL DATA:  Non-small-cell lung cancer, right-sided. Left nephrectomy for renal cell carcinoma in 2012. Chemotherapy finishing 11/22/2015. Restaging. EXAM: CT CHEST WITH CONTRAST TECHNIQUE: Multidetector CT imaging of the chest was performed during intravenous contrast administration. CONTRAST:  40m OMNIPAQUE IOHEXOL 300 MG/ML  SOLN COMPARISON:  PET 09/29/2015.  Chest CT 09/19/2015. FINDINGS: Mediastinum/Nodes: No supraclavicular adenopathy. Aortic and branch vessel atherosclerosis. Mild cardiomegaly with mild lipomatous hypertrophy of the interatrial septum. No central pulmonary embolism, on this non-dedicated study. No mediastinal or hilar adenopathy. No residual extension into the right hilum. Lungs/Pleura: No pleural fluid. Mild centrilobular emphysema. Mild biapical scarring. Significant decrease in size of the central right lower lobe lung lesion. This measures on the order of 2.5 x 2.3 cm on image 18/ series 5. Compare 3.6 x 4.5 cm on 09/19/2015. Significantly greater cavitation today. Clear left lung. Upper abdomen: Normal imaged portions of the liver, spleen, stomach, pancreas, gallbladder, adrenal glands. Left nephrectomy. Normal imaged right kidney. Musculoskeletal: Cervical spine fixation. IMPRESSION: 1. Marked response to therapy. Significant decrease in size of a central right upper lobe pulmonary nodule with increase in cavitation. 2. No evidence of thoracic adenopathy to suggest metastatic disease. 3. Left nephrectomy. Electronically Signed   By: KAbigail MiyamotoM.D.   On: 12/13/2015 12:00   Dg Femur Min 2 Views Left  12/10/2015  CLINICAL DATA:  Left leg pain, history of lung carcinoma EXAM: LEFT FEMUR 2 VIEWS COMPARISON:  None.  FINDINGS: There is no evidence of fracture or other focal bone lesions. Soft tissues are unremarkable. IMPRESSION: No acute abnormality noted. Electronically Signed   By: MInez CatalinaM.D.   On: 12/10/2015 13:04    CLINICAL DATA: Non-small cell lung cancer. Personal history of renal cell carcinoma.  EXAM: MRI HEAD WITHOUT AND WITH CONTRAST  TECHNIQUE: Multiplanar, multiecho pulse sequences of the brain and surrounding structures were obtained without and with intravenous contrast.  CONTRAST: 880mMULTIHANCE GADOBENATE DIMEGLUMINE 529 MG/ML IV SOLN  COMPARISON: None.  FINDINGS: Scattered periventricular and subcortical T2 changes bilaterally are slightly greater than expected for age. No acute infarct, hemorrhage, or mass lesion is present. The ventricles are of normal size. No significant extraaxial fluid collection is present.  White matter changes extend into the brainstem. The cerebellum is within normal limits.  The internal auditory canals are normal bilaterally. Flow is present in the major intracranial arteries. Bilateral lens replacements are present. The globes and orbits are intact. The paranasal sinuses and mastoid air cells are clear.  The postcontrast images demonstrate no pathologic enhancement to suggest metastatic disease of the brain or meninges.  IMPRESSION: 1. Mild age advanced white matter disease. This likely reflects the sequela of chronic microvascular ischemia. 2. No pathologic enhancement to suggest metastatic disease of the brain or meninges.   Electronically Signed  By: San Morelle M.D.  On: 10/21/2015 14:54  I have independently reviewed the above radiologic studies.  Recent Lab Findings: Lab Results  Component Value Date   WBC 12.0* 12/06/2015   HGB 9.1* 12/06/2015   HCT 28.0* 12/06/2015   PLT 92* 12/06/2015   GLUCOSE 99 12/06/2015   CHOL 174 12/10/2015   TRIG 150* 12/10/2015   HDL 89 12/10/2015   LDLCALC 55  12/10/2015   ALT 25 12/06/2015   AST 19 12/06/2015   NA 139 12/06/2015   K 5.3* 12/06/2015   CL 101 09/20/2015   CREATININE 1.0 12/06/2015   BUN 24.2 12/06/2015   CO2 25 12/06/2015   TSH 1.583 12/14/2011   INR 0.94 09/20/2015      Assessment / Plan:   Anemia and thrombocytopenia, likely chemo related  PFT's with 6 min walk test to evaluate physical tolerance  for resection Cardiology consult /preop clearance with long smoking history and very positive family hx of CAD  After the above I will see her back to discuss possibility of resection.   I  spent 40 minutes counseling the patient face to face and 50% or more the  time was spent in counseling and coordination of care. The total time spent in the appointment was 60 minutes.  [name] MD      [address] [place] [address] Office [phone]   Beeper [phone]  12/22/2015 4:26 PM

## 2015-12-23 ENCOUNTER — Other Ambulatory Visit: Payer: Self-pay | Admitting: *Deleted

## 2015-12-23 DIAGNOSIS — R918 Other nonspecific abnormal finding of lung field: Secondary | ICD-10-CM

## 2015-12-26 NOTE — Progress Notes (Signed)
Cardiology Office Note:    Date:  12/27/2015   ID:  Tammy Ball, DOB [birth year], MRN [id number]  PCP:  [name], MD  Cardiologist:  Dr. Quay Burow   Electrophysiologist:  N/a  Referring MD: Dr. Ceasar Mons  Chief Complaint  Patient presents with  . Surgical clearance    Consult     History of Present Illness:     Tammy Ball is a 75 y.o. female with a hx of tobacco abuse, HTN, HL, FHx of CAD.  She was seen by Dr. Quay Burow in 08/2013 for risk factor modification.    She has been dx with Stage IIA NSC Lung CA. She initially underwent 3 rounds of chemotherapy.  She recently saw Dr. Servando Snare for surgical consultation and resection of her tumor.  She is referred for pre-operative cardiac evaluation.    She tells me she has a hx of a stress test in 2001. She notes a hx of a murmur her whole life.  She denies chest pain. She does note DOE over the past 2 years. She is NYHA 2-2b.  She denies orthopnea, PND, edema. She denies syncope.  PFTs were done today. She sees Dr. Servando Snare again later this week.    Past Medical History  Diagnosis Date  . COPD (chronic obstructive pulmonary disease) ([place])   . Renal cell carcinoma ([place])     L nephrectomy  . Hypertension   . Depression   . Asthma   . Glaucoma   . Hyperlipidemia   . Heart murmur   . Non-small cell carcinoma of right lung, stage 2 ([place]) 10/03/2015  . Chemotherapy-induced neuropathy ([place]) 11/01/2015    Past Surgical History  Procedure Laterality Date  . Nephrectomy    . Kidney cancer    . Tubal ligation    . Spine surgery    . Eye surgery    . Video bronchoscopy Bilateral 09/20/2015    Procedure: VIDEO BRONCHOSCOPY WITHOUT FLUORO;  Surgeon: Rigoberto Noel, MD;  Location: [facility];  Service: Cardiopulmonary;  Laterality: Bilateral;    Current Medications: Outpatient Prescriptions Prior to Visit  Medication Sig Dispense Refill  . acetaminophen (TYLENOL) 500 MG tablet Take 1,000 mg by mouth  every 6 (six) hours as needed for mild pain, moderate pain, fever or headache.    . albuterol (PROVENTIL HFA;VENTOLIN HFA) 108 (90 BASE) MCG/ACT inhaler Inhale 2 puffs into the lungs every 4 (four) hours as needed for wheezing or shortness of breath. 1 Inhaler 0  . aspirin EC 81 MG tablet Take 81 mg by mouth daily.    Marland Kitchen atorvastatin (LIPITOR) 40 MG tablet TAKE ONE TABLET BY MOUTH ONCE DAILY. 90 tablet 3  . benzonatate (TESSALON) 200 MG capsule Take 1 capsule (200 mg total) by mouth 3 (three) times daily as needed for cough. 20 capsule 0  . CALCIUM PO Take 1 tablet by mouth daily.    . Cholecalciferol (VITAMIN D-3 PO) Take 1 tablet by mouth daily.    . fish oil-omega-3 fatty acids 1000 MG capsule Take 1 capsule by mouth daily.     . fluticasone (FLONASE) 50 MCG/ACT nasal spray Place 2 sprays into both nostrils daily. 9.9 g 0  . loratadine (CLARITIN) 10 MG tablet Take 10 mg by mouth daily. Reported on 12/10/2015    . LORazepam (ATIVAN) 0.5 MG tablet Take 1 tablet (0.5 mg total) by mouth 2 (two) times daily as needed for anxiety. 10 tablet 1  . mirabegron ER (MYRBETRIQ) 50 MG  TB24 tablet Take 50 mg by mouth daily.    . mometasone-formoterol (DULERA) 200-5 MCG/ACT AERO Inhale 2 puffs into the lungs 2 (two) times daily. 3 Inhaler 0  . oxyCODONE-acetaminophen (PERCOCET/ROXICET) 5-325 MG tablet Take 1-2 tablets by mouth every 6 (six) hours as needed for severe pain. 45 tablet 0  . polyvinyl alcohol-povidone (REFRESH) 1.4-0.6 % ophthalmic solution Place 2 drops into both eyes daily as needed (for dry eyes).    Marland Kitchen UNABLE TO FIND Med Name: Wig 1 each 0  . valsartan (DIOVAN) 80 MG tablet Take 1 tablet (80 mg total) by mouth daily. 90 tablet 0  . ipratropium-albuterol (DUONEB) 0.5-2.5 (3) MG/3ML SOLN Take 3 mLs by nebulization 3 (three) times daily. (Patient not taking: Reported on 12/27/2015) 360 mL 1  . methylPREDNISolone (MEDROL DOSEPAK) 4 MG TBPK tablet Use as instructed (Patient not taking: Reported on  12/27/2015) 21 tablet 0  . predniSONE (DELTASONE) 10 MG tablet Take 4 daily for 3 days 3 a day for 3 days 2 a day for 3 days one a day for 3 days (Patient not taking: Reported on 12/27/2015) 30 tablet 0  . prochlorperazine (COMPAZINE) 10 MG tablet Take 1 tablet (10 mg total) by mouth every 6 (six) hours as needed for nausea or vomiting. (Patient not taking: Reported on 12/27/2015) 30 tablet 0  . traMADol (ULTRAM) 50 MG tablet Take 1-2 tablets (50-100 mg total) by mouth every 6 (six) hours as needed. (Patient not taking: Reported on 12/27/2015) 60 tablet 2   No facility-administered medications prior to visit.     Allergies:   Bee venom; Levofloxacin; Amlodipine; and Hctz   Social History   Social History  . Marital Status: Widowed    Spouse Name: N/A  . Number of Children: N/A  . Years of Education: N/A   Social History Main Topics  . Smoking status: Former Smoker -- 0.50 packs/day for 50 years    Types: Cigarettes    Quit date: 03/16/2011  . Smokeless tobacco: Never Used  . Alcohol Use: No  . Drug Use: No  . Sexual Activity: Not Currently   Other Topics Concern  . None   Social History Narrative   Occupation: Substance Abuse Estate agent   No exercise** Merged History Encounter **       ** Data from: 12/14/11 Enc Dept: [facility]       ** Data from: 12/17/11 Enc Dept: [facility]   Substance abuse counselor   Husband deceased   89 great grandchildren   Son works in same substance abuse counseling center as patient     Family History:  The patient's family history includes Birth defects in her sister; Breast cancer in her sister; CAD (age of onset: 49) in her mother; Cancer in her maternal grandmother and mother; Glaucoma in her daughter; Heart attack (age of onset: 16) in her daughter; Heart disease in her sister; Hypertension in her mother; Obesity in her brother.   ROS:   Please see the history of present illness.    Review of  Systems  Cardiovascular: Positive for dyspnea on exertion.  Musculoskeletal: Positive for back pain.  All other systems reviewed and are negative.   Physical Exam:    VS:  BP 142/80 mmHg  Pulse 88  Ht '5\' 4"'$  (1.626 m)  Wt 178 lb 12.8 oz (81.103 kg)  BMI 30.68 kg/m2   GEN: Well nourished, well developed, in no acute  distress HEENT: normal Neck: no JVD, no masses Cardiac: Normal S1/S2, RRR; 1/6 early systolic murmur LSB,  no edema;  no carotid bruits,   Respiratory: decreased breath sounds bilaterally; no wheezing, rhonchi or rales GI: soft, nontender  MS: no deformity or atrophy Skin: warm and dry, no rash Neuro:   no focal deficits  Psych: Alert and oriented x 3, normal affect  Wt Readings from Last 3 Encounters:  12/27/15 178 lb 12.8 oz (81.103 kg)  12/22/15 180 lb 8 oz (81.874 kg)  12/10/15 177 lb (80.287 kg)      Studies/Labs Reviewed:     EKG:  EKG is  ordered today.  The ekg ordered today demonstrates NSR, HR 95, normal axis, no ST changes, QTc 459 ms  Recent Labs: 12/06/2015: ALT 25; BUN 24.2; Creatinine 1.0; HGB 9.1*; Platelets 92*; Potassium 5.3*; Sodium 139   Recent Lipid Panel    Component Value Date/Time   CHOL 174 12/10/2015 1102   TRIG 150* 12/10/2015 1102   HDL 89 12/10/2015 1102   CHOLHDL 2.0 12/10/2015 1102   VLDL 30 12/10/2015 1102   LDLCALC 55 12/10/2015 1102    Additional studies/ records that were reviewed today include:   Echo 6/12 Mild LVH, EF 65-75% (hyperdynamic), no RWMA, mild MR, mod to severe LAE, mild to mod TR, PASP 53 mmHg   ASSESSMENT:     1. Pre-operative cardiovascular examination   2. Shortness of breath   3. Essential hypertension   4. Non-small cell carcinoma of right lung, stage 2 ([place])   5. Murmur      PLAN:     In order of problems listed above:  1. Surgical clearance - The patient has significant CRFs.  She has a hx of DOE but no chest pain.  She also has a cardiac murmur that is likely benign.  Surgical  resection of her lung cancer is being considered.    -  Arrange Lexiscan Myoview for risk stratification (she cannot walk on treadmill due to back pain)  -  Arrange 2-D echo  -  Will arrange tests ASAP.  If Myoview low risk and Echo ok, no further work up is needed.  2. DOE - This is probably related to COPD and Lung CA.  Will get Echo to rule out structural heart disease and Myoview to rule out ischemia.  3. HTN - Borderline BP today. Continue to monitor.  4. Lung CA - FU with Dr. Servando Snare and Oncology as planned.  5. Murmur - Echo will be obtained.   Medication Adjustments/Labs and Tests Ordered: Current medicines are reviewed at length with the patient today.  Concerns regarding medicines are outlined above.  Medication changes, Labs and Tests ordered today are outlined in the Patient Instructions noted below. Patient Instructions  Medication Instructions:  Your physician recommends that you continue on your current medications as directed. Please refer to the Current Medication list given to you today.  Labwork: None ordered  Testing/Procedures: Your physician has requested that you have a lexiscan myoview. For further information please visit [email or web address]. Please follow instruction sheet, as given.  Your physician has requested that you have an echocardiogram. Echocardiography is a painless test that uses sound waves to create images of your heart. It provides your doctor with information about the size and shape of your heart and how well your heart's chambers and valves are working. This procedure takes approximately one hour. There are no restrictions for this procedure.  Follow-Up: Your physician wants you to  follow-up in: 6 months with Dr. Gwenlyn Found. You will receive a reminder letter in the mail two months in advance. If you don't receive a letter, please call our office to schedule the follow-up appointment.  Any Other Special Instructions Will Be Listed Below (If  Applicable).  If you need a refill on your cardiac medications before your next appointment, please call your pharmacy.     Signed, Richardson Dopp, PA-C  12/27/2015 4:42 PM    [facility] [address], [place], [place]  [address] Phone: [phone]; Fax: [phone]

## 2015-12-27 ENCOUNTER — Encounter: Payer: Self-pay | Admitting: Physician Assistant

## 2015-12-27 ENCOUNTER — Ambulatory Visit (HOSPITAL_COMMUNITY)
Admission: RE | Admit: 2015-12-27 | Discharge: 2015-12-27 | Disposition: A | Discharge status: Home / Self Care | Payer: Medicare Other | Source: Ambulatory Visit | Attending: Cardiothoracic Surgery | Admitting: Cardiothoracic Surgery

## 2015-12-27 ENCOUNTER — Ambulatory Visit (INDEPENDENT_AMBULATORY_CARE_PROVIDER_SITE_OTHER): Payer: Medicare Other | Admitting: Physician Assistant

## 2015-12-27 VITALS — BP 142/80 | HR 88 | Ht 64.0 in | Wt 178.8 lb

## 2015-12-27 DIAGNOSIS — C3491 Malignant neoplasm of unspecified part of right bronchus or lung: Secondary | ICD-10-CM | POA: Diagnosis not present

## 2015-12-27 DIAGNOSIS — R918 Other nonspecific abnormal finding of lung field: Secondary | ICD-10-CM

## 2015-12-27 DIAGNOSIS — Z01810 Encounter for preprocedural cardiovascular examination: Secondary | ICD-10-CM

## 2015-12-27 DIAGNOSIS — R011 Cardiac murmur, unspecified: Secondary | ICD-10-CM

## 2015-12-27 DIAGNOSIS — R0602 Shortness of breath: Secondary | ICD-10-CM

## 2015-12-27 DIAGNOSIS — I10 Essential (primary) hypertension: Secondary | ICD-10-CM

## 2015-12-27 LAB — PULMONARY FUNCTION TEST
DL/VA % pred: 67 %
DL/VA: 3.25 ml/min/mmHg/L
DLCO unc % pred: 57 %
DLCO unc: 13.83 ml/min/mmHg
FEF 25-75 Post: 0.98 L/sec
FEF 25-75 Pre: 1.18 L/sec
FEF2575-%Change-Post: -16 %
FEF2575-%Pred-Post: 54 %
FEF2575-%Pred-Pre: 66 %
FEV1-%Change-Post: -4 %
FEV1-%Pred-Post: 78 %
FEV1-%Pred-Pre: 83 %
FEV1-Post: 1.74 L
FEV1-Pre: 1.83 L
FEV1FVC-%Change-Post: -4 %
FEV1FVC-%Pred-Pre: 95 %
FEV6-%Change-Post: 0 %
FEV6-%Pred-Post: 90 %
FEV6-%Pred-Pre: 91 %
FEV6-Post: 2.52 L
FEV6-Pre: 2.54 L
FEV6FVC-%Change-Post: 0 %
FEV6FVC-%Pred-Post: 103 %
FEV6FVC-%Pred-Pre: 104 %
FVC-%Change-Post: 0 %
FVC-%Pred-Post: 87 %
FVC-%Pred-Pre: 87 %
FVC-Post: 2.56 L
FVC-Pre: 2.56 L
Post FEV1/FVC ratio: 68 %
Post FEV6/FVC ratio: 99 %
Pre FEV1/FVC ratio: 71 %
Pre FEV6/FVC Ratio: 99 %
RV % pred: 103 %
RV: 2.3 L
TLC % pred: 101 %
TLC: 5.11 L

## 2015-12-27 MED ORDER — ALBUTEROL SULFATE (2.5 MG/3ML) 0.083% IN NEBU
2.5000 mg | INHALATION_SOLUTION | Freq: Once | RESPIRATORY_TRACT | Status: AC
  Administered 2015-12-27: 2.5 mg via RESPIRATORY_TRACT

## 2015-12-27 NOTE — Patient Instructions (Addendum)
Medication Instructions:  Your physician recommends that you continue on your current medications as directed. Please refer to the Current Medication list given to you today.  Labwork: None ordered  Testing/Procedures: Your physician has requested that you have a lexiscan myoview. For further information please visit [email or web address]. Please follow instruction sheet, as given.  Your physician has requested that you have an echocardiogram. Echocardiography is a painless test that uses sound waves to create images of your heart. It provides your doctor with information about the size and shape of your heart and how well your heart's chambers and valves are working. This procedure takes approximately one hour. There are no restrictions for this procedure.  Follow-Up: Your physician wants you to follow-up in: 6 months with Dr. Gwenlyn Found. You will receive a reminder letter in the mail two months in advance. If you don't receive a letter, please call our office to schedule the follow-up appointment.  Any Other Special Instructions Will Be Listed Below (If Applicable).  If you need a refill on your cardiac medications before your next appointment, please call your pharmacy.

## 2015-12-29 ENCOUNTER — Encounter: Payer: Self-pay | Admitting: Cardiothoracic Surgery

## 2015-12-29 ENCOUNTER — Ambulatory Visit (INDEPENDENT_AMBULATORY_CARE_PROVIDER_SITE_OTHER): Payer: Medicare Other | Admitting: Cardiothoracic Surgery

## 2015-12-29 VITALS — BP 156/86 | HR 104 | Resp 16 | Ht 64.0 in | Wt 178.0 lb

## 2015-12-29 DIAGNOSIS — C3411 Malignant neoplasm of upper lobe, right bronchus or lung: Secondary | ICD-10-CM

## 2015-12-29 NOTE — Progress Notes (Signed)
[address]       [place] [address]             [phone]                      Ashlie S Sipos [place] Medical Record #[id number] Date of Birth: [birth year]  Referring: Curt Bears, MD Primary Care: [name], MD  Chief Complaint:    Chief Complaint  Patient presents with  . Follow-up    with PFT and 6 minute walk test    History of Present Illness:    ANABELLE BUNGERT 75 y.o. female is seen in the office  today for diagnosis of Lung Cancer., she has now completed 3 cycles of chemo. Squamous cell with large right upper lobe mass diagnosed  nov 2016  She has history   h/o copd gold II , frequent sinus infections, hypertension, She present to ER in Nov  with 2 days of fevers and chills, sob and cough. On arrival to ED, she was febrile, tachycardic, hypotensive, hypoxic and witht elevated lactic acid.  She was found to have pneumonia on CXR. She was referred to medical service for sepsis from CAP. Bronchoscopy by Dr Elsworth Soho revealed whitish endobrochial mass seen blocking entry of RUl bronchus.      DIAGNOSIS: Stage IIA (T2a, N1, M0) non-small cell lung cancer, squamous cell carcinoma presented with large right upper lobe lung mass with right hilar involvement diagnosed in November 2016.  PRIOR THERAPY: Neoadjuvant systemic chemotherapy with carboplatin for AUC of 6 and paclitaxel 200 MG/M2 every 3 weeks, status post 3 cycles with partial response.   CURRENT THERAPY: None.   Bronchoscopy by Dr Elsworth Soho Airway entered and the following bronchi were examined: Bronchi. Thick white purulent secretions removed. bscope had to be removed due to thick plug & reintroduced. .  Procedures performed: Endobronchial Biopsies x 4 , brushings x 2 & BAL were obtained fromt his area Bronchoscope removed.  Current Activity/ Functional Status:  Patient is independent with mobility/ambulation, transfers, ADL's, IADL's.   Zubrod Score: At the time of  surgery this patient's most appropriate activity status/level should be described as: '[]'$     0    Normal activity, no symptoms '[x]'$     1    Restricted in physical strenuous activity but ambulatory, able to do out light work '[]'$     2    Ambulatory and capable of self care, unable to do work activities, up and about               >50 % of waking hours                              '[]'$     3    Only limited self care, in bed greater than 50% of waking hours '[]'$     4    Completely disabled, no self care, confined to bed or chair '[]'$     5    Moribund   Past Medical History  Diagnosis Date  . COPD (chronic obstructive pulmonary disease) ([place])   . Renal cell carcinoma ([place])     L nephrectomy  . Hypertension   . Depression   . Asthma   . Glaucoma   . Hyperlipidemia   . Heart murmur   . Non-small cell carcinoma of right lung, stage 2 ([place]) 10/03/2015  . Chemotherapy-induced neuropathy ([place]) 11/01/2015  Past Surgical History  Procedure Laterality Date  . Nephrectomy    . Kidney cancer    . Tubal ligation    . Spine surgery    . Eye surgery    . Video bronchoscopy Bilateral 09/20/2015    Procedure: VIDEO BRONCHOSCOPY WITHOUT FLUORO;  Surgeon: Rigoberto Noel, MD;  Location: [facility];  Service: Cardiopulmonary;  Laterality: Bilateral;    Family History  Problem Relation Age of Onset  . Heart disease Sister   . Obesity Brother   . Heart attack Daughter 60    s/p CABG  . Glaucoma Daughter   . Breast cancer Sister   . Birth defects Sister   . Cancer Mother     Bladder Cancer  . Hypertension Mother   . CAD Mother 46  . Cancer Maternal Grandmother     Social History   Social History  . Marital Status: Widowed    Spouse Name: N/A  . Number of Children: N/A  . Years of Education: N/A   Occupational History  . Not on file.   Social History Main Topics  . Smoking status: Former Smoker -- 0.50 packs/day for 50 years    Types: Cigarettes    Quit date: 03/16/2011  . Smokeless  tobacco: Never Used  . Alcohol Use: No  . Drug Use: No  . Sexual Activity: Not Currently   Other Topics Concern  . Not on file   Social History Narrative   Occupation: Substance Abuse Counselor   Divorced   College Education   No exercise** Merged History Encounter **       ** Data from: 12/14/11 Enc Dept: [facility]       ** Data from: 12/17/11 Enc Dept: [facility]   Substance abuse counselor   Husband deceased   4 great grandchildren   Son works in same substance abuse counseling center as patient    History  Smoking status  . Former Smoker -- 0.50 packs/day for 50 years  . Types: Cigarettes  . Quit date: 03/16/2011  Smokeless tobacco  . Never Used    History  Alcohol Use No     Allergies  Allergen Reactions  . Bee Venom Anaphylaxis, Shortness Of Breath and Swelling    Swelling at site   . Levofloxacin Other (See Comments)    Joint pain   . Amlodipine Swelling    Swelling of the ankles and hands   . Hctz [Hydrochlorothiazide] Palpitations and Other (See Comments)    Sweating     Current Outpatient Prescriptions  Medication Sig Dispense Refill  . acetaminophen (TYLENOL) 500 MG tablet Take 1,000 mg by mouth every 6 (six) hours as needed for mild pain, moderate pain, fever or headache.    . albuterol (PROVENTIL HFA;VENTOLIN HFA) 108 (90 BASE) MCG/ACT inhaler Inhale 2 puffs into the lungs every 4 (four) hours as needed for wheezing or shortness of breath. 1 Inhaler 0  . aspirin EC 81 MG tablet Take 81 mg by mouth daily.    Marland Kitchen atorvastatin (LIPITOR) 40 MG tablet TAKE ONE TABLET BY MOUTH ONCE DAILY. 90 tablet 3  . benzonatate (TESSALON) 200 MG capsule Take 1 capsule (200 mg total) by mouth 3 (three) times daily as needed for cough. 20 capsule 0  . CALCIUM PO Take 1 tablet by mouth daily.    . Cholecalciferol (VITAMIN D-3 PO) Take 1 tablet by mouth daily.    . fish oil-omega-3 fatty acids 1000 MG capsule Take  1 capsule by mouth daily.     .  fluticasone (FLONASE) 50 MCG/ACT nasal spray Place 2 sprays into both nostrils daily. 9.9 g 0  . LORazepam (ATIVAN) 0.5 MG tablet Take 1 tablet (0.5 mg total) by mouth 2 (two) times daily as needed for anxiety. 10 tablet 1  . mirabegron ER (MYRBETRIQ) 50 MG TB24 tablet Take 50 mg by mouth daily.    . mometasone-formoterol (DULERA) 200-5 MCG/ACT AERO Inhale 2 puffs into the lungs 2 (two) times daily. 3 Inhaler 0  . oxyCODONE-acetaminophen (PERCOCET/ROXICET) 5-325 MG tablet Take 1-2 tablets by mouth every 6 (six) hours as needed for severe pain. 45 tablet 0  . polyvinyl alcohol-povidone (REFRESH) 1.4-0.6 % ophthalmic solution Place 2 drops into both eyes daily as needed (for dry eyes).    . traMADol (ULTRAM) 50 MG tablet TAKE 1-2 TABLETS ( 50-100 MG TOTAL) BY MOUTH EVERY 6 (SIX) HOURS  AS NEEDED FOR PAIN    . UNABLE TO FIND Med Name: Wig 1 each 0  . valsartan (DIOVAN) 80 MG tablet Take 1 tablet (80 mg total) by mouth daily. 90 tablet 0   No current facility-administered medications for this visit.      Review of Systems:     Cardiac Review of Systems: Y or N  Chest Pain [  n  ]  Resting SOB [ y  ] Exertional SOB  [ y ]  Orthopnea [ y ]   Pedal Edema [n   ]    Palpitations [n  ] Syncope  [ n ]   Presyncope [ n  ]  General Review of Systems: [Y] = yes [  ]=no Constitional: recent weight change [ n];  Wt loss over the last 3 months [   ] anorexia [  ]; fatigue Blue.Reese  ]; nausea [  ]; night sweats [  ]; fever [  ]; or chills [  ];          Dental: poor dentition[  ]; Last Dentist visit:   Eye : blurred vision [  ]; diplopia [   ]; vision changes [  ];  Amaurosis fugax[  ]; Resp: cough Blue.Reese  ];  wheezing[ y ];  hemoptysis[n  ]; shortness of breath[ y ]; paroxysmal nocturnal dyspnea[ y ]; dyspnea on exertion[  ]; or orthopnea[  ];  GI:  gallstones[  ], vomiting[  ];  dysphagia[  ]; melena[  ];  hematochezia [  ]; heartburn[  ];   Hx of  Colonoscopy[  ]; GU: kidney stones [  ]; hematuria[  ];   dysuria [   ];  nocturia[  ];  history of     obstruction [  ]; urinary frequency [  ]             Skin: rash, swelling[  ];, hair loss[  ];  peripheral edema[  ];  or itching[  ]; Musculosketetal: myalgias[  ];  joint swelling[  ];  joint erythema[  ];  joint pain[  ];  back pain[  ];  Heme/Lymph: bruising[  ];  bleeding[  ];  anemia[  ];  Neuro: TIA[n  ];  headaches[  ];  stroke[  ];  vertigo[  ];  seizures[n  ];   paresthesias[  ];  difficulty walking[  ];  Psych:depression[  ]; anxiety[  ];  Endocrine: diabetes[  ];  thyroid dysfunction[  ];  Immunizations: Flu up to date [  ]; Pneumococcal up to date [  ];  Other:  Physical Exam: BP 156/86 mmHg  Pulse 104  Resp 16  Ht '5\' 4"'$  (1.626 m)  Wt 178 lb (80.74 kg)  BMI 30.54 kg/m2  SpO2 97%  PHYSICAL EXAMINATION: General appearance: alert and cooperative Head: Normocephalic, without obvious abnormality, atraumatic Neck: no adenopathy, no carotid bruit, no JVD, supple, symmetrical, trachea midline and thyroid not enlarged, symmetric, no tenderness/mass/nodules Lymph nodes: Cervical, supraclavicular, and axillary nodes normal. Resp: diminished breath sounds bibasilar Back: symmetric, no curvature. ROM normal. No CVA tenderness. Cardio: regular rate and rhythm, S1, S2 normal, no murmur, click, rub or gallop GI: soft, non-tender; bowel sounds normal; no masses,  no organomegaly Extremities: extremities normal, atraumatic, no cyanosis or edema and Homans sign is negative, no sign of DVT Neurologic: Grossly normal  Diagnostic Studies & Laboratory data:     Recent Radiology Findings:   Ct Chest W Contrast  12/13/2015  CLINICAL DATA:  Non-small-cell lung cancer, right-sided. Left nephrectomy for renal cell carcinoma in 2012. Chemotherapy finishing 11/22/2015. Restaging. EXAM: CT CHEST WITH CONTRAST TECHNIQUE: Multidetector CT imaging of the chest was performed during intravenous contrast administration. CONTRAST:  7m OMNIPAQUE IOHEXOL 300 MG/ML  SOLN  COMPARISON:  PET 09/29/2015.  Chest CT 09/19/2015. FINDINGS: Mediastinum/Nodes: No supraclavicular adenopathy. Aortic and branch vessel atherosclerosis. Mild cardiomegaly with mild lipomatous hypertrophy of the interatrial septum. No central pulmonary embolism, on this non-dedicated study. No mediastinal or hilar adenopathy. No residual extension into the right hilum. Lungs/Pleura: No pleural fluid. Mild centrilobular emphysema. Mild biapical scarring. Significant decrease in size of the central right lower lobe lung lesion. This measures on the order of 2.5 x 2.3 cm on image 18/ series 5. Compare 3.6 x 4.5 cm on 09/19/2015. Significantly greater cavitation today. Clear left lung. Upper abdomen: Normal imaged portions of the liver, spleen, stomach, pancreas, gallbladder, adrenal glands. Left nephrectomy. Normal imaged right kidney. Musculoskeletal: Cervical spine fixation. IMPRESSION: 1. Marked response to therapy. Significant decrease in size of a central right upper lobe pulmonary nodule with increase in cavitation. 2. No evidence of thoracic adenopathy to suggest metastatic disease. 3. Left nephrectomy. Electronically Signed   By: KAbigail MiyamotoM.D.   On: 12/13/2015 12:00   Dg Femur Min 2 Views Left  12/10/2015  CLINICAL DATA:  Left leg pain, history of lung carcinoma EXAM: LEFT FEMUR 2 VIEWS COMPARISON:  None. FINDINGS: There is no evidence of fracture or other focal bone lesions. Soft tissues are unremarkable. IMPRESSION: No acute abnormality noted. Electronically Signed   By: MInez CatalinaM.D.   On: 12/10/2015 13:04    CLINICAL DATA: Non-small cell lung cancer. Personal history of renal cell carcinoma.  EXAM: MRI HEAD WITHOUT AND WITH CONTRAST  TECHNIQUE: Multiplanar, multiecho pulse sequences of the brain and surrounding structures were obtained without and with intravenous contrast.  CONTRAST: 873mMULTIHANCE GADOBENATE DIMEGLUMINE 529 MG/ML IV SOLN  COMPARISON:  None.  FINDINGS: Scattered periventricular and subcortical T2 changes bilaterally are slightly greater than expected for age. No acute infarct, hemorrhage, or mass lesion is present. The ventricles are of normal size. No significant extraaxial fluid collection is present.  White matter changes extend into the brainstem. The cerebellum is within normal limits.  The internal auditory canals are normal bilaterally. Flow is present in the major intracranial arteries. Bilateral lens replacements are present. The globes and orbits are intact. The paranasal sinuses and mastoid air cells are clear.  The postcontrast images demonstrate no pathologic enhancement to suggest metastatic disease of the brain or meninges.  IMPRESSION:  1. Mild age advanced white matter disease. This likely reflects the sequela of chronic microvascular ischemia. 2. No pathologic enhancement to suggest metastatic disease of the brain or meninges.   Electronically Signed  By: San Morelle M.D.  On: 10/21/2015 14:54  I have independently reviewed the above radiologic studies.  Recent Lab Findings: Lab Results  Component Value Date   WBC 12.0* 12/06/2015   HGB 9.1* 12/06/2015   HCT 28.0* 12/06/2015   PLT 92* 12/06/2015   GLUCOSE 99 12/06/2015   CHOL 174 12/10/2015   TRIG 150* 12/10/2015   HDL 89 12/10/2015   LDLCALC 55 12/10/2015   ALT 25 12/06/2015   AST 19 12/06/2015   NA 139 12/06/2015   K 5.3* 12/06/2015   CL 101 09/20/2015   CREATININE 1.0 12/06/2015   BUN 24.2 12/06/2015   CO2 25 12/06/2015   TSH 1.583 12/14/2011   INR 0.94 09/20/2015   Full PFT's done  FEV1 1.83  83% dlco 13.83 57%  Interpretation: The FEV1 is normal, but the FEV1/FVC ratio and FEF25-75% are reduced. The increased airway resistance and decreased specific conductance indicate a central airway disease. Lung volumes are within normal limits. Following administration of bronchodilators, there is no  significant response. The reduced diffusing capacity indicates a moderate loss of functional alveolar capillary surface. However, the diffusing capacity was not corrected for the patient's hemoglobin. Pulmonary Function Diagnosis: Minimal Obstructive Airways Disease Insignificant response to bronchodilator Moderate Diffusion Defect 6 Minute Walk Test Results  Patient: LEILAH POLIMENI Date:  12/29/2015   Supplemental O2 during test? NONE      Baseline   End  Time   1016    1022 Heartrate  104    124 Dyspnea  0    1 Fatigue  0    3 O2 sat   97%    89% Blood pressure 156/86    176/67   Patient ambulated at a FAST pace for a total distance of 102fet with NOstops.  Ambulation was limited primarily due to N/A.  Overall the test was tolerated well     Assessment / Plan:   Anemia and thrombocytopenia, likely chemo related PFT's with 6 min walk test  DONE SEE ABOVE to evaluate physical tolerance  for resection, some limited patient in diffusion capacity, FEV1 is okay for lobectomy. Patient require sleeve resection, pneumonectomy would be contraindicated with her pulmonary function, awaiting for cardiology workup prior to making a definite decision about surgical resection Patient has seen Cardiology consult /preop clearance, echo and stress test is pending  with long smoking history and very positive family hx of CAD  I plan to see her back on February 24 after her Myoview 3 extra stress test has been completed.  EGrace IsaacMD      [address] [place] [address]Office [phone]   Beeper [phone] 12/29/2015 12:40 PM

## 2016-01-03 ENCOUNTER — Telehealth (HOSPITAL_COMMUNITY): Payer: Self-pay | Admitting: *Deleted

## 2016-01-03 DIAGNOSIS — R3129 Other microscopic hematuria: Secondary | ICD-10-CM | POA: Diagnosis not present

## 2016-01-03 DIAGNOSIS — N3941 Urge incontinence: Secondary | ICD-10-CM | POA: Diagnosis not present

## 2016-01-03 DIAGNOSIS — Z0000 Encounter for general adult medical examination without abnormal findings: Secondary | ICD-10-CM | POA: Diagnosis not present

## 2016-01-03 DIAGNOSIS — C642 Malignant neoplasm of left kidney, except renal pelvis: Secondary | ICD-10-CM | POA: Diagnosis not present

## 2016-01-03 NOTE — Telephone Encounter (Signed)
Patient given detailed instructions per Myocardial Perfusion Study Information Sheet for the test on 01/05/16 at 730. Patient notified to arrive 15 minutes early and that it is imperative to arrive on time for appointment to keep from having the test rescheduled.  If you need to cancel or reschedule your appointment, please call the office within 24 hours of your appointment. Failure to do so may result in a cancellation of your appointment, and a $50 no show fee. Patient verbalized understanding.Hubbard Robinson, RN

## 2016-01-05 ENCOUNTER — Ambulatory Visit (HOSPITAL_COMMUNITY): Payer: Medicare Other | Attending: Cardiovascular Disease

## 2016-01-05 DIAGNOSIS — I10 Essential (primary) hypertension: Secondary | ICD-10-CM | POA: Diagnosis not present

## 2016-01-05 DIAGNOSIS — Z01810 Encounter for preprocedural cardiovascular examination: Secondary | ICD-10-CM | POA: Diagnosis not present

## 2016-01-05 DIAGNOSIS — R0609 Other forms of dyspnea: Secondary | ICD-10-CM | POA: Insufficient documentation

## 2016-01-05 DIAGNOSIS — R0602 Shortness of breath: Secondary | ICD-10-CM | POA: Diagnosis not present

## 2016-01-05 LAB — MYOCARDIAL PERFUSION IMAGING
LV dias vol: 41 mL
LV sys vol: 9 mL
Peak HR: 103 {beats}/min
RATE: 0.26
Rest HR: 80 {beats}/min
SDS: 12
SRS: 1
SSS: 13
TID: 0.73

## 2016-01-05 MED ORDER — TECHNETIUM TC 99M SESTAMIBI GENERIC - CARDIOLITE
11.0000 | Freq: Once | INTRAVENOUS | Status: AC | PRN
  Administered 2016-01-05: 11 via INTRAVENOUS

## 2016-01-05 MED ORDER — TECHNETIUM TC 99M SESTAMIBI GENERIC - CARDIOLITE
32.7000 | Freq: Once | INTRAVENOUS | Status: AC | PRN
  Administered 2016-01-05: 32.7 via INTRAVENOUS

## 2016-01-05 MED ORDER — REGADENOSON 0.4 MG/5ML IV SOLN
0.4000 mg | Freq: Once | INTRAVENOUS | Status: AC
  Administered 2016-01-05: 0.4 mg via INTRAVENOUS

## 2016-01-06 ENCOUNTER — Ambulatory Visit (INDEPENDENT_AMBULATORY_CARE_PROVIDER_SITE_OTHER): Payer: Medicare Other | Admitting: Cardiothoracic Surgery

## 2016-01-06 ENCOUNTER — Telehealth: Payer: Self-pay | Admitting: *Deleted

## 2016-01-06 ENCOUNTER — Encounter: Payer: Self-pay | Admitting: Cardiothoracic Surgery

## 2016-01-06 VITALS — BP 130/76 | HR 71 | Resp 16 | Ht 64.0 in | Wt 178.0 lb

## 2016-01-06 DIAGNOSIS — C3411 Malignant neoplasm of upper lobe, right bronchus or lung: Secondary | ICD-10-CM | POA: Diagnosis not present

## 2016-01-06 NOTE — Progress Notes (Signed)
[address]       [place] [address]             [phone]                      Tammy Ball [facility] Medical Record #[id number] Date of Birth: 1941-03-04  Referring: Curt Bears, MD Primary Care: [name], MD  Chief Complaint:    Chief Complaint  Patient presents with  . Follow-up    after cardiac clearance/myoview testing 01/05/16    History of Present Illness:    Tammy Ball 75 y.o. female is seen in the office  today for diagnosis of Lung Cancer., she has now completed 3 cycles of chemo. Squamous cell with large right upper lobe mass diagnosed  nov 2016  She has history   h/o copd gold II , frequent sinus infections, hypertension, She present to ER in Nov  with 2 days of fevers and chills, sob and cough. On arrival to ED, she was febrile, tachycardic, hypotensive, hypoxic and witht elevated lactic acid.  She was found to have pneumonia on CXR. She was referred to medical service for sepsis from CAP. Bronchoscopy by Dr Elsworth Soho revealed whitish endobrochial mass seen blocking entry of RUl bronchus.      DIAGNOSIS: Stage IIA (T2a, N1, M0) non-small cell lung cancer, squamous cell carcinoma presented with large right upper lobe lung mass with right hilar involvement diagnosed in November 2016.  PRIOR THERAPY: Neoadjuvant systemic chemotherapy with carboplatin for AUC of 6 and paclitaxel 200 MG/M2 every 3 weeks, status post 3 cycles with partial response.   CURRENT THERAPY: None.   Bronchoscopy by Dr Elsworth Soho Airway entered and the following bronchi were examined: Bronchi. Thick white purulent secretions removed. bscope had to be removed due to thick plug & reintroduced. .  Procedures performed: Endobronchial Biopsies x 4 , brushings x 2 & BAL were obtained fromt his area Bronchoscope removed.  Current Activity/ Functional Status:  Patient is independent with mobility/ambulation, transfers, ADL's, IADL's.   Zubrod Score: At  the time of surgery this patient's most appropriate activity status/level should be described as: '[]'$     0    Normal activity, no symptoms '[x]'$     1    Restricted in physical strenuous activity but ambulatory, able to do out light work '[]'$     2    Ambulatory and capable of self care, unable to do work activities, up and about               >50 % of waking hours                              '[]'$     3    Only limited self care, in bed greater than 50% of waking hours '[]'$     4    Completely disabled, no self care, confined to bed or chair '[]'$     5    Moribund   Past Medical History  Diagnosis Date  . COPD (chronic obstructive pulmonary disease) ([place])   . Renal cell carcinoma ([place])     L nephrectomy  . Hypertension   . Depression   . Asthma   . Glaucoma   . Hyperlipidemia   . Heart murmur   . Non-small cell carcinoma of right lung, stage 2 ([place]) 10/03/2015  . Chemotherapy-induced neuropathy ([place]) 11/01/2015  Past Surgical History  Procedure Laterality Date  . Nephrectomy    . Kidney cancer    . Tubal ligation    . Spine surgery    . Eye surgery    . Video bronchoscopy Bilateral 09/20/2015    Procedure: VIDEO BRONCHOSCOPY WITHOUT FLUORO;  Surgeon: Rigoberto Noel, MD;  Location: [facility];  Service: Cardiopulmonary;  Laterality: Bilateral;    Family History  Problem Relation Age of Onset  . Heart disease Sister   . Obesity Brother   . Heart attack Daughter 70    s/p CABG  . Glaucoma Daughter   . Breast cancer Sister   . Birth defects Sister   . Cancer Mother     Bladder Cancer  . Hypertension Mother   . CAD Mother 15  . Cancer Maternal Grandmother     Social History   Social History  . Marital Status: Widowed    Spouse Name: N/A  . Number of Children: N/A  . Years of Education: N/A   Occupational History  . Not on file.   Social History Main Topics  . Smoking status: Former Smoker -- 0.50 packs/day for 50 years    Types: Cigarettes    Quit date: 03/16/2011  .  Smokeless tobacco: Never Used  . Alcohol Use: No  . Drug Use: No  . Sexual Activity: Not Currently   Other Topics Concern  . Not on file   Social History Narrative   Occupation: Substance Abuse Counselor   Divorced   College Education   No exercise** Merged History Encounter **       ** Data from: 12/14/11 Enc Dept: [facility]       ** Data from: 12/17/11 Enc Dept: [facility]   Substance abuse counselor   Husband deceased   4 great grandchildren   Son works in same substance abuse counseling center as patient    History  Smoking status  . Former Smoker -- 0.50 packs/day for 50 years  . Types: Cigarettes  . Quit date: 03/16/2011  Smokeless tobacco  . Never Used    History  Alcohol Use No     Allergies  Allergen Reactions  . Bee Venom Anaphylaxis, Shortness Of Breath and Swelling    Swelling at site   . Levofloxacin Other (See Comments)    Joint pain   . Amlodipine Swelling    Swelling of the ankles and hands   . Hctz [Hydrochlorothiazide] Palpitations and Other (See Comments)    Sweating     Current Outpatient Prescriptions  Medication Sig Dispense Refill  . acetaminophen (TYLENOL) 500 MG tablet Take 1,000 mg by mouth every 6 (six) hours as needed for mild pain, moderate pain, fever or headache.    . albuterol (PROVENTIL HFA;VENTOLIN HFA) 108 (90 BASE) MCG/ACT inhaler Inhale 2 puffs into the lungs every 4 (four) hours as needed for wheezing or shortness of breath. 1 Inhaler 0  . aspirin EC 81 MG tablet Take 81 mg by mouth daily.    Marland Kitchen atorvastatin (LIPITOR) 40 MG tablet TAKE ONE TABLET BY MOUTH ONCE DAILY. 90 tablet 3  . CALCIUM PO Take 1 tablet by mouth daily.    . Cholecalciferol (VITAMIN D-3 PO) Take 1 tablet by mouth daily.    . fish oil-omega-3 fatty acids 1000 MG capsule Take 1 capsule by mouth daily.     . fluticasone (FLONASE) 50 MCG/ACT nasal spray Place 2 sprays into both nostrils daily. 9.9 g 0  .  LORazepam (ATIVAN) 0.5 MG tablet Take  1 tablet (0.5 mg total) by mouth 2 (two) times daily as needed for anxiety. 10 tablet 1  . mirabegron ER (MYRBETRIQ) 50 MG TB24 tablet Take 50 mg by mouth daily.    . mometasone-formoterol (DULERA) 200-5 MCG/ACT AERO Inhale 2 puffs into the lungs 2 (two) times daily. 3 Inhaler 0  . oxyCODONE-acetaminophen (PERCOCET/ROXICET) 5-325 MG tablet Take 1-2 tablets by mouth every 6 (six) hours as needed for severe pain. 45 tablet 0  . polyvinyl alcohol-povidone (REFRESH) 1.4-0.6 % ophthalmic solution Place 2 drops into both eyes daily as needed (for dry eyes).    . traMADol (ULTRAM) 50 MG tablet TAKE 1-2 TABLETS ( 50-100 MG TOTAL) BY MOUTH EVERY 6 (SIX) HOURS  AS NEEDED FOR PAIN    . UNABLE TO FIND Med Name: Wig 1 each 0  . valsartan (DIOVAN) 80 MG tablet Take 1 tablet (80 mg total) by mouth daily. 90 tablet 0  . benzonatate (TESSALON) 200 MG capsule Take 1 capsule (200 mg total) by mouth 3 (three) times daily as needed for cough. (Patient not taking: Reported on 01/06/2016) 20 capsule 0   No current facility-administered medications for this visit.      Review of Systems:     Cardiac Review of Systems: Y or N  Chest Pain [  n  ]  Resting SOB [ y  ] Exertional SOB  [ y ]  Orthopnea [ y ]   Pedal Edema [n   ]    Palpitations [n  ] Syncope  [ n ]   Presyncope [ n  ]  General Review of Systems: [Y] = yes [  ]=no Constitional: recent weight change [ n];  Wt loss over the last 3 months [   ] anorexia [  ]; fatigue Blue.Reese  ]; nausea [  ]; night sweats [  ]; fever [  ]; or chills [  ];          Dental: poor dentition[  ]; Last Dentist visit:   Eye : blurred vision [  ]; diplopia [   ]; vision changes [  ];  Amaurosis fugax[  ]; Resp: cough Blue.Reese  ];  wheezing[ y ];  hemoptysis[n  ]; shortness of breath[ y ]; paroxysmal nocturnal dyspnea[ y ]; dyspnea on exertion[  ]; or orthopnea[  ];  GI:  gallstones[  ], vomiting[  ];  dysphagia[  ]; melena[  ];  hematochezia [  ]; heartburn[  ];   Hx of  Colonoscopy[  ]; GU:  kidney stones [  ]; hematuria[  ];   dysuria [  ];  nocturia[  ];  history of     obstruction [  ]; urinary frequency [  ]             Skin: rash, swelling[  ];, hair loss[  ];  peripheral edema[  ];  or itching[  ]; Musculosketetal: myalgias[  ];  joint swelling[  ];  joint erythema[  ];  joint pain[  ];  back pain[  ];  Heme/Lymph: bruising[  ];  bleeding[  ];  anemia[  ];  Neuro: TIA[n  ];  headaches[  ];  stroke[  ];  vertigo[  ];  seizures[n  ];   paresthesias[  ];  difficulty walking[left leg pain  ];  Psych:depression[  ]; anxiety[  ];  Endocrine: diabetes[  ];  thyroid dysfunction[  ];  Immunizations: Flu up to date Blue.Reese  ];  Pneumococcal up to date Blue.Reese  ];  Other:  Physical Exam: BP 130/76 mmHg  Pulse 71  Resp 16  Ht '5\' 4"'$  (1.626 m)  Wt 178 lb (80.74 kg)  BMI 30.54 kg/m2  SpO2 95%  PHYSICAL EXAMINATION: General appearance: alert and cooperative Head: Normocephalic, without obvious abnormality, atraumatic Neck: no adenopathy, no carotid bruit, no JVD, supple, symmetrical, trachea midline and thyroid not enlarged, symmetric, no tenderness/mass/nodules Lymph nodes: Cervical, supraclavicular, and axillary nodes normal. Resp: diminished breath sounds bibasilar Back: symmetric, no curvature. ROM normal. No CVA tenderness. Cardio: regular rate and rhythm, S1, S2 normal, no murmur, click, rub or gallop GI: soft, non-tender; bowel sounds normal; no masses,  no organomegaly Extremities: extremities normal, atraumatic, no cyanosis or edema and Homans sign is negative, no sign of DVT Neurologic: Grossly normal  Diagnostic Studies & Laboratory data:     Recent Radiology Findings:   Ct Chest W Contrast  12/13/2015  CLINICAL DATA:  Non-small-cell lung cancer, right-sided. Left nephrectomy for renal cell carcinoma in 2012. Chemotherapy finishing 11/22/2015. Restaging. EXAM: CT CHEST WITH CONTRAST TECHNIQUE: Multidetector CT imaging of the chest was performed during intravenous contrast  administration. CONTRAST:  25m OMNIPAQUE IOHEXOL 300 MG/ML  SOLN COMPARISON:  PET 09/29/2015.  Chest CT 09/19/2015. FINDINGS: Mediastinum/Nodes: No supraclavicular adenopathy. Aortic and branch vessel atherosclerosis. Mild cardiomegaly with mild lipomatous hypertrophy of the interatrial septum. No central pulmonary embolism, on this non-dedicated study. No mediastinal or hilar adenopathy. No residual extension into the right hilum. Lungs/Pleura: No pleural fluid. Mild centrilobular emphysema. Mild biapical scarring. Significant decrease in size of the central right lower lobe lung lesion. This measures on the order of 2.5 x 2.3 cm on image 18/ series 5. Compare 3.6 x 4.5 cm on 09/19/2015. Significantly greater cavitation today. Clear left lung. Upper abdomen: Normal imaged portions of the liver, spleen, stomach, pancreas, gallbladder, adrenal glands. Left nephrectomy. Normal imaged right kidney. Musculoskeletal: Cervical spine fixation. IMPRESSION: 1. Marked response to therapy. Significant decrease in size of a central right upper lobe pulmonary nodule with increase in cavitation. 2. No evidence of thoracic adenopathy to suggest metastatic disease. 3. Left nephrectomy. Electronically Signed   By: KAbigail MiyamotoM.D.   On: 12/13/2015 12:00   Dg Femur Min 2 Views Left  12/10/2015  CLINICAL DATA:  Left leg pain, history of lung carcinoma EXAM: LEFT FEMUR 2 VIEWS COMPARISON:  None. FINDINGS: There is no evidence of fracture or other focal bone lesions. Soft tissues are unremarkable. IMPRESSION: No acute abnormality noted. Electronically Signed   By: MInez CatalinaM.D.   On: 12/10/2015 13:04    CLINICAL DATA: Non-small cell lung cancer. Personal history of renal cell carcinoma.  EXAM: MRI HEAD WITHOUT AND WITH CONTRAST  TECHNIQUE: Multiplanar, multiecho pulse sequences of the brain and surrounding structures were obtained without and with intravenous contrast.  CONTRAST: 837mMULTIHANCE GADOBENATE  DIMEGLUMINE 529 MG/ML IV SOLN  COMPARISON: None.  FINDINGS: Scattered periventricular and subcortical T2 changes bilaterally are slightly greater than expected for age. No acute infarct, hemorrhage, or mass lesion is present. The ventricles are of normal size. No significant extraaxial fluid collection is present.  White matter changes extend into the brainstem. The cerebellum is within normal limits.  The internal auditory canals are normal bilaterally. Flow is present in the major intracranial arteries. Bilateral lens replacements are present. The globes and orbits are intact. The paranasal sinuses and mastoid air cells are clear.  The postcontrast images demonstrate no pathologic enhancement to suggest metastatic  disease of the brain or meninges.  IMPRESSION: 1. Mild age advanced white matter disease. This likely reflects the sequela of chronic microvascular ischemia. 2. No pathologic enhancement to suggest metastatic disease of the brain or meninges.   Electronically Signed  By: San Morelle M.D.  On: 10/21/2015 14:54  I have independently reviewed the above radiologic studies.  Recent Lab Findings: Lab Results  Component Value Date   WBC 12.0* 12/06/2015   HGB 9.1* 12/06/2015   HCT 28.0* 12/06/2015   PLT 92* 12/06/2015   GLUCOSE 99 12/06/2015   CHOL 174 12/10/2015   TRIG 150* 12/10/2015   HDL 89 12/10/2015   LDLCALC 55 12/10/2015   ALT 25 12/06/2015   AST 19 12/06/2015   NA 139 12/06/2015   K 5.3* 12/06/2015   CL 101 09/20/2015   CREATININE 1.0 12/06/2015   BUN 24.2 12/06/2015   CO2 25 12/06/2015   TSH 1.583 12/14/2011   INR 0.94 09/20/2015   Full PFT's done  FEV1 1.83  83% dlco 13.83 57%  Interpretation: The FEV1 is normal, but the FEV1/FVC ratio and FEF25-75% are reduced. The increased airway resistance and decreased specific conductance indicate a central airway disease. Lung volumes are within normal limits.  Following administration of bronchodilators, there is no significant response. The reduced diffusing capacity indicates a moderate loss of functional alveolar capillary surface. However, the diffusing capacity was not corrected for the patient's hemoglobin. Pulmonary Function Diagnosis: Minimal Obstructive Airways Disease Insignificant response to bronchodilator Moderate Diffusion Defect 6 Minute Walk Test Results  Patient: TIYONA DESOUZA Date:  01/06/2016   Supplemental O2 during test? NONE      Baseline   End  Time   1016    1022 Heartrate  104    124 Dyspnea  0    1 Fatigue  0    3 O2 sat   97%    89% Blood pressure 156/86    176/67   Patient ambulated at a FAST pace for a total distance of 1096fet with NOstops.  Ambulation was limited primarily due to N/A.  Overall the test was tolerated well  Myoview stress test :  The left ventricular ejection fraction is hyperdynamic (>65%).  Nuclear stress EF: 79%.  There was no ST segment deviation noted during stress.  The study is normal.  Normal stress nuclear study with no ischemia or infarction; EF 79 with normal wall motion.     Nuclear History and Indications    History and Indications Indication for Stress Test: Risk stratification - Preoperative History: No Cardiac History Cardiac Risk Factors: Hypertension  Symptoms: DOE    Stress Findings    ECG Baseline ECG exhibits normal sinus rhythm..   Stress Findings A pharmacological stress test was performed using IV Lexiscan 0.'4mg'$  over 10 seconds performed without concurrent submaximal exercise.  The patient reported shortness of breath during the stress test.   Response to Stress There was no ST segment deviation noted during stress.  Arrhythmias during stress: occasional PACs.  Arrhythmias during recovery: occasional occasional, PACs.  Arrhythmias were not significant.  ECG was interpretable and there was no significant change from baseline.      Assessment / Plan:   Anemia and thrombocytopenia, likely chemo related PFT's with 6 min walk test  DONE SEE ABOVE to evaluate physical tolerance  for resection, some limited patient in diffusion capacity, FEV1 is okay for lobectomy. Patient may require sleeve resection, pneumonectomy would be contraindicated with her pulmonary function Patient has seen Cardiology  consult /preop clearance, echo  is pending , stress test noted above   I have discussed procedure in detail with patient and he son . Plan to proceed with Bronchoscopy and if favorable at bronchoscopy proceed with resection March 8.   [name] MD      [address] [place] [address] Office [phone]   Beeper [phone]  01/06/2016 12:12 PM

## 2016-01-06 NOTE — Telephone Encounter (Signed)
Pt notified of myoview results normal by phone with verbal understanding.

## 2016-01-06 NOTE — Patient Instructions (Signed)
Stop fish oil now Do not take Diovan ( Valsartan ) for 48 hours before surgery Thoracotomy, Care After Refer to this sheet in the next few weeks. These instructions provide you with information on caring for yourself after your procedure. Your health care provider may also give you more specific instructions. Your treatment has been planned according to current medical practices, but problems sometimes occur. Call your health care provider if you have any problems or questions after your procedure. WHAT TO EXPECT AFTER YOUR PROCEDURE After your procedure, it is typical to have the following sensations:  You may feel pain at the incision site.  You may be constipated from the pain medicine given and the change in your level of activity.  You may feel extremely tired. HOME CARE INSTRUCTIONS  Take over-the-counter or prescription medicines for pain, discomfort, or fever only as directed by your health care provider. It is very important to take pain relieving medicine before your pain becomes severe. You will be able to breathe and cough more comfortably if your pain is well controlled.  Take deep breaths. Deep breathing helps to keep your lungs inflated and protects against a lung infection (pneumonia).  Cough frequently. Even though coughing may cause discomfort, coughing is important to clear mucus (phlegm) and expand your lungs. Coughing helps prevent pneumonia. If it hurts to cough, hold a pillow against your chest when you cough. This may help with the discomfort.  Continue to use an incentive spirometer as directed. The use of an incentive spirometer helps to keep your lungs inflated and protects against pneumonia.  Change the bandages over your incision as needed or as directed by your health care provider.  Remove the bandages over your chest tube site as directed by your health care provider.  Resume your normal diet as directed. It is important to have adequate protein, calories,  vitamins, and minerals to promote healing.  Prevent constipation.  Eat high-fiber foods such as whole grain cereals and breads, brown rice, beans, and fresh fruits and vegetables.  Drink enough water and fluids to keep your urine clear or pale yellow. Avoid drinking beverages containing caffeine. Beverages containing caffeine can cause dehydration and harden your stool.  Talk to your health care provider about taking a stool softener or laxative.  Avoid lifting until you are instructed otherwise.  Do not drive until directed by your health care provider.  Do not drive while taking pain medicines (narcotics).  Do not bathe, swim, or use a hot tub until directed by your health care provider. You may shower instead. Gently wash the area of your incision with water and soap as directed. Do not use anything else to clean your incision except as directed by your health care provider.  Do not use any tobacco products including cigarettes, chewing tobacco, or electronic cigarettes.  Avoid secondhand smoke.  Schedule an appointment for stitch (suture) or staple removal as directed.  Schedule and attend all follow-up visits as directed by your health care provider. It is important to keep all your appointments.  Participate in pulmonary rehabilitation as directed by your health care provider.  Do not travel by airplane for 2 weeks after your chest tube is removed. SEEK MEDICAL CARE IF:  You are bleeding from your wounds.  Your heartbeat seems irregular.  You have redness, swelling, or increasing pain in the wounds.  There is pus coming from your wounds.  There is a bad smell coming from the wound or dressing.  You have a  fever or chills.  You have nausea or are vomiting.  You have muscle aches. SEEK IMMEDIATE MEDICAL CARE IF:  You have a rash.  You have difficulty breathing.  You have a reaction or side effect to medicines given.  You have persistent nausea.  You have  lightheadedness or feel faint.  You have shortness of breath or chest pain.  You have persistent pain.   This information is not intended to replace advice given to you by your health care provider. Make sure you discuss any questions you have with your health care provider.   Document Released: 04/13/2011 Document Revised: 03/15/2015 Document Reviewed: 06/17/2013 Elsevier Interactive Patient Education 2016 [employer] Lung Resection A lung resection is a procedure to remove part or all of a lung. When an entire lung is removed, the procedure is called a pneumonectomy. When only part of a lung is removed, the procedure is called a lobectomy. A lung resection is typically done to get rid of a tumor or cancer, but it may be done to treat other conditions. This procedure can help relieve some or all of your symptoms and can also help keep the problem from getting worse. Lung resection may provide the best chance for curing your disease. However, the procedure may not necessarily cure lung cancer if that is the problem.  LET [facility] CARE PROVIDER KNOW ABOUT:   Any allergies you have.  All medicines you are taking, including vitamins, herbs, eye drops, creams, and over-the-counter medicines.  Previous problems you or members of your family have had with the use of anesthetics.  Any blood disorders you have.  Previous surgeries you have had.  Medical conditions you have. RISKS AND COMPLICATIONS  Generally, lung resection is a safe procedure. However, problems can occur and include:  Excessive bleeding.  Infection.  Inability to breathe without a ventilator.  Persistent shortness of breath.  Heart problems, including abnormal rhythms and a risk of heart attack or heart failure.  Blood clots.  Injury to a blood vessel.  Injury to a nerve.  Failure to heal properly.  Stroke.  Bronchopleural fistula. This is a small hole between one of the main breathing tubes (bronchus)  and the lining of the lungs. This is rare.  Reaction to anesthesia. BEFORE THE PROCEDURE  You may have tests done before the procedure, including:  Blood tests.  Urine tests.  X-rays.  Other imaging tests (such as CT scans, MRI scans, and PET scans). These tests are done to find the exact size and location of the condition being treated with this surgery.  Pulmonary function tests. These are breathing tests to assess the function of your lungs before surgery and to decide how to best help your breathing after surgery.  Heart testing. This is done to make sure your heart is strong enough for the procedure.  Bronchoscopy. This is a technique that allows your health care provider to look at the inside of your airways. This is done using a soft, flexible tube (bronchoscope). Along with imaging tests, this can help your health care provider know the exact location and size of the area that will be removed during surgery.  Lymph node sampling. This may need to be done to see if the tumor has spread. It may be done as a separate surgery or right before your lung resection procedure. PROCEDURE  An IV tube will be placed in your arm. You will be given a medicine that makes you fall asleep (general anesthetic). You  may also get pain medicine through a thin, flexible tube (catheter) in your back.  A breathing tube will be placed in your throat.  Once the surgical team has prepared you for surgery, your surgeon will make an incision on your side. Some resections are done through large incisions, while others can be done through small incisions using smaller instruments and assisted with small cameras (laparoscopic surgery).  Your surgeon will carefully cut the veins, arteries, and bronchus leading to your lung. After being cut, each of these pieces will be sewn or stapled closed. The lung or part of the lung will then be removed.  Your surgeon will check inside your chest to make sure there is no  bleeding in or around the lungs. Lymph nodes near the lung may also be removed for later tests.  Your surgeon may put tubes into your chest to drain extra fluid and air after surgery.  Your incision will be closed. This may be done using:  Stitches that absorb into your body and do not need to be removed.  Stitches that must be removed.  Staples that must be removed. AFTER THE PROCEDURE   You will be taken to the recovery area and your progress will be monitored. You may still have a breathing tube and other tubes or catheters in your body immediately after surgery. These will be removed during your recovery. You may be put on a respirator following surgery if some assistance is needed to help your breathing. When you are awake and not experiencing immediate problems from surgery, you will be moved to the intensive care unit (ICU) where you will continue your recovery.  You may feel pain in your chest and throat. Sometimes during recovery, patients may shiver or feel nauseous. You will be given medicine to help with pain and nausea.  The breathing tube will be taken out as soon as your health care providers feel you can breathe on your own. For most people, this happens on the same day as the surgery.  If your surgery and time in the ICU go well, most of the tubes and equipment will be taken out within 1-2 days after surgery. This is about how long most people stay in the ICU. You may need to stay longer, depending on how you are doing.  You should also start respiratory therapy in the ICU. This therapy uses breathing exercises to help your other lung stay healthy and get stronger.  As you improve, you will be moved to a regular hospital room for continued respiratory therapy, help with your bladder and bowels, and to continue medicines.  After your lung or part of your lung is taken out, there will be a space inside your chest. This space will often fill up with fluid over time. The amount  of time this takes is different for each person.  You will receive care until you are doing well and your health care provider feels it is safe for you to go home or to transfer to an extended care facility.   This information is not intended to replace advice given to you by your health care provider. Make sure you discuss any questions you have with your health care provider.   Document Released: 01/19/2003 Document Revised: 11/19/2014 Document Reviewed: 12/18/2013 Elsevier Interactive Patient Education [employer].

## 2016-01-09 ENCOUNTER — Other Ambulatory Visit: Payer: Self-pay | Admitting: *Deleted

## 2016-01-09 ENCOUNTER — Other Ambulatory Visit: Payer: Self-pay

## 2016-01-09 ENCOUNTER — Ambulatory Visit (HOSPITAL_COMMUNITY): Payer: Medicare Other | Attending: Internal Medicine

## 2016-01-09 DIAGNOSIS — R918 Other nonspecific abnormal finding of lung field: Secondary | ICD-10-CM

## 2016-01-09 DIAGNOSIS — C801 Malignant (primary) neoplasm, unspecified: Secondary | ICD-10-CM | POA: Diagnosis not present

## 2016-01-09 DIAGNOSIS — R0602 Shortness of breath: Secondary | ICD-10-CM | POA: Diagnosis not present

## 2016-01-09 DIAGNOSIS — R011 Cardiac murmur, unspecified: Secondary | ICD-10-CM | POA: Diagnosis not present

## 2016-01-09 DIAGNOSIS — E785 Hyperlipidemia, unspecified: Secondary | ICD-10-CM | POA: Insufficient documentation

## 2016-01-09 DIAGNOSIS — I10 Essential (primary) hypertension: Secondary | ICD-10-CM | POA: Insufficient documentation

## 2016-01-09 DIAGNOSIS — I340 Nonrheumatic mitral (valve) insufficiency: Secondary | ICD-10-CM | POA: Insufficient documentation

## 2016-01-09 DIAGNOSIS — I313 Pericardial effusion (noninflammatory): Secondary | ICD-10-CM | POA: Diagnosis not present

## 2016-01-09 DIAGNOSIS — Z6830 Body mass index (BMI) 30.0-30.9, adult: Secondary | ICD-10-CM | POA: Insufficient documentation

## 2016-01-09 DIAGNOSIS — C649 Malignant neoplasm of unspecified kidney, except renal pelvis: Secondary | ICD-10-CM | POA: Insufficient documentation

## 2016-01-10 ENCOUNTER — Telehealth: Payer: Self-pay | Admitting: *Deleted

## 2016-01-10 ENCOUNTER — Encounter: Payer: Self-pay | Admitting: Physician Assistant

## 2016-01-10 NOTE — Telephone Encounter (Signed)
Pt has been notified of echo results and findings by phone with verbal understanding. Richardson Dopp, PA has sent Dr. Servando Snare his note that pt is cleared for her surgery.

## 2016-01-13 NOTE — Pre-Procedure Instructions (Signed)
Tammy Ball  01/13/2016      [facility] [address], [place] - [address] [place] [address] Phone: [phone] Fax: [phone]    Your procedure is scheduled on Wednesday, March 8th   Report to [facility] Admitting at 6:30 AM             (Posted Surgery time 8:30 am - 1:33 pm)   Call this number if you have problems the morning of surgery:  [phone]   Remember:  Do not eat food or drink liquids after midnight Tuesday.   Take these medicines the morning of surgery with A SIP OF WATER : Lorazepam, Oxycodone.  Please use your inhalers and Flonase the morning of surgery.             (4-5 days prior to surgery, STOP taking any herbal medications / supplements, vitamins, anti-inflammatories.)               Do not wear jewelry, no rings, earrings, watches.  Do not wear lotions, perfumes or powders.    You may NOT wear deodorant the day of surgery.   Do not shave 48 hours prior to surgery.    Do not bring valuables to the hospital.  [facility] is not responsible for any belongings or valuables.  Contacts, dentures or bridgework may not be worn into surgery.  Leave your suitcase in the car.  After surgery it may be brought to your room. For patients admitted to the hospital, discharge time will be determined by your treatment team.  Name and phone number of your driver:    Please read over the following fact sheets that you were given. Pain Booklet, Coughing and Deep Breathing, Blood Transfusion Information, MRSA Information and Surgical Site Infection Prevention

## 2016-01-16 ENCOUNTER — Encounter (HOSPITAL_COMMUNITY): Payer: Self-pay

## 2016-01-16 ENCOUNTER — Ambulatory Visit (HOSPITAL_COMMUNITY)
Admission: RE | Admit: 2016-01-16 | Discharge: 2016-01-16 | Disposition: A | Discharge status: Home / Self Care | Payer: Medicare Other | Source: Ambulatory Visit | Attending: Cardiothoracic Surgery | Admitting: Cardiothoracic Surgery

## 2016-01-16 ENCOUNTER — Encounter (HOSPITAL_COMMUNITY)
Admission: RE | Admit: 2016-01-16 | Discharge: 2016-01-16 | Disposition: A | Discharge status: Home / Self Care | Payer: Medicare Other | Source: Ambulatory Visit | Attending: Cardiothoracic Surgery | Admitting: Cardiothoracic Surgery

## 2016-01-16 VITALS — BP 142/67 | HR 94 | Temp 99.1°F | Resp 18 | Ht 64.0 in | Wt 178.1 lb

## 2016-01-16 DIAGNOSIS — Z79899 Other long term (current) drug therapy: Secondary | ICD-10-CM | POA: Insufficient documentation

## 2016-01-16 DIAGNOSIS — I10 Essential (primary) hypertension: Secondary | ICD-10-CM | POA: Insufficient documentation

## 2016-01-16 DIAGNOSIS — R918 Other nonspecific abnormal finding of lung field: Secondary | ICD-10-CM

## 2016-01-16 DIAGNOSIS — Z7982 Long term (current) use of aspirin: Secondary | ICD-10-CM

## 2016-01-16 DIAGNOSIS — Z01812 Encounter for preprocedural laboratory examination: Secondary | ICD-10-CM

## 2016-01-16 DIAGNOSIS — J984 Other disorders of lung: Secondary | ICD-10-CM | POA: Diagnosis not present

## 2016-01-16 DIAGNOSIS — Z01818 Encounter for other preprocedural examination: Secondary | ICD-10-CM | POA: Insufficient documentation

## 2016-01-16 DIAGNOSIS — E785 Hyperlipidemia, unspecified: Secondary | ICD-10-CM

## 2016-01-16 HISTORY — DX: Anxiety disorder, unspecified: F41.9

## 2016-01-16 HISTORY — DX: Claustrophobia: F40.240

## 2016-01-16 HISTORY — DX: Headache: R51

## 2016-01-16 HISTORY — DX: Headache, unspecified: R51.9

## 2016-01-16 HISTORY — DX: Anemia, unspecified: D64.9

## 2016-01-16 LAB — COMPREHENSIVE METABOLIC PANEL
ALT: 19 U/L (ref 14–54)
AST: 21 U/L (ref 15–41)
Albumin: 4.4 g/dL (ref 3.5–5.0)
Alkaline Phosphatase: 82 U/L (ref 38–126)
Anion gap: 11 (ref 5–15)
BUN: 18 mg/dL (ref 6–20)
CO2: 22 mmol/L (ref 22–32)
Calcium: 10.1 mg/dL (ref 8.9–10.3)
Chloride: 108 mmol/L (ref 101–111)
Creatinine, Ser: 1.12 mg/dL — ABNORMAL HIGH (ref 0.44–1.00)
GFR calc Af Amer: 55 mL/min — ABNORMAL LOW (ref >60)
GFR calc non Af Amer: 48 mL/min — ABNORMAL LOW (ref >60)
Glucose, Bld: 113 mg/dL — ABNORMAL HIGH (ref 65–99)
Potassium: 4.7 mmol/L (ref 3.5–5.1)
Sodium: 141 mmol/L (ref 135–145)
Total Bilirubin: 0.5 mg/dL (ref 0.3–1.2)
Total Protein: 7.6 g/dL (ref 6.5–8.1)

## 2016-01-16 LAB — URINALYSIS, ROUTINE W REFLEX MICROSCOPIC
Bilirubin Urine: NEGATIVE
Glucose, UA: NEGATIVE mg/dL
Hgb urine dipstick: NEGATIVE
Ketones, ur: NEGATIVE mg/dL
Leukocytes, UA: NEGATIVE
Nitrite: NEGATIVE
Protein, ur: NEGATIVE mg/dL
Specific Gravity, Urine: 1.007 (ref 1.005–1.030)
pH: 5.5 (ref 5.0–8.0)

## 2016-01-16 LAB — BLOOD GAS, ARTERIAL
Acid-Base Excess: 1.6 mmol/L (ref 0.0–2.0)
Bicarbonate: 22.7 mEq/L (ref 20.0–24.0)
Drawn by: 206361
FIO2: 0.21
O2 Saturation: 96.8 %
Patient temperature: 98.6
TCO2: 23.9 mmol/L (ref 0–100)
pCO2 arterial: 39.3 mmHg (ref 35.0–45.0)
pH, Arterial: 7.38 (ref 7.350–7.450)
pO2, Arterial: 90.4 mmHg (ref 80.0–100.0)

## 2016-01-16 LAB — APTT: aPTT: 27 seconds (ref 24–37)

## 2016-01-16 LAB — CBC
HCT: 39.4 % (ref 36.0–46.0)
Hemoglobin: 12.5 g/dL (ref 12.0–15.0)
MCH: 31.9 pg (ref 26.0–34.0)
MCHC: 31.7 g/dL (ref 30.0–36.0)
MCV: 100.5 fL — ABNORMAL HIGH (ref 78.0–100.0)
Platelets: 440 10*3/uL — ABNORMAL HIGH (ref 150–400)
RBC: 3.92 MIL/uL (ref 3.87–5.11)
RDW: 18 % — ABNORMAL HIGH (ref 11.5–15.5)
WBC: 7.8 10*3/uL (ref 4.0–10.5)

## 2016-01-16 LAB — PROTIME-INR
INR: 1 (ref 0.00–1.49)
Prothrombin Time: 13.4 seconds (ref 11.6–15.2)

## 2016-01-16 LAB — SURGICAL PCR SCREEN
MRSA, PCR: NEGATIVE
Staphylococcus aureus: NEGATIVE

## 2016-01-16 LAB — ABO/RH: ABO/RH(D): A POS

## 2016-01-16 NOTE — Progress Notes (Signed)
Cardio is Dr. Gwenlyn Found.  LOV "was sometime last yr".  But she has had echo and stress tests done in order to have this surgery.   She states she has 'heart murmur', but has no related problems.   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 H/o renal cell CA, -- only has 1 kidney now.

## 2016-01-17 MED ORDER — DEXTROSE 5 % IV SOLN
1.5000 g | INTRAVENOUS | Status: AC
  Administered 2016-01-18: 1.5 g via INTRAVENOUS
  Filled 2016-01-17: qty 1.5

## 2016-01-17 NOTE — Anesthesia Preprocedure Evaluation (Addendum)
Anesthesia Evaluation  Patient identified by MRN, date of birth, ID band Patient awake    Reviewed: Allergy & Precautions, NPO status , Patient's Chart, lab work & pertinent test results  Airway Mallampati: II  TM Distance: >3 FB Neck ROM: Full    Dental no notable dental hx.    Pulmonary asthma , pneumonia, COPD, former smoker,    Pulmonary exam normal breath sounds clear to auscultation       Cardiovascular hypertension, Pt. on medications Normal cardiovascular exam+ Valvular Problems/Murmurs  Rhythm:Regular Rate:Normal     Neuro/Psych  Headaches, PSYCHIATRIC DISORDERS Anxiety Depression    GI/Hepatic negative GI ROS, Neg liver ROS,   Endo/Other  negative endocrine ROS  Renal/GU Renal disease     Musculoskeletal negative musculoskeletal ROS (+)   Abdominal   Peds  Hematology  (+) anemia ,   Anesthesia Other Findings   Reproductive/Obstetrics negative OB ROS                            Anesthesia Physical Anesthesia Plan  ASA: III  Anesthesia Plan: General   Post-op Pain Management:    Induction: Intravenous  Airway Management Planned: Oral ETT and Double Lumen EBT  Additional Equipment: Arterial line  Intra-op Plan:   Post-operative Plan: Extubation in OR  Informed Consent: I have reviewed the patients History and Physical, chart, labs and discussed the procedure including the risks, benefits and alternatives for the proposed anesthesia with the patient or authorized representative who has indicated his/her understanding and acceptance.   Dental advisory given  Plan Discussed with: CRNA  Anesthesia Plan Comments: (2x large bore (>18 g PIV), +/- CVL)        Anesthesia Quick Evaluation

## 2016-01-18 ENCOUNTER — Inpatient Hospital Stay (HOSPITAL_COMMUNITY)
Admission: RE | Admit: 2016-01-18 | Discharge: 2016-01-24 | DRG: 164 | Disposition: A | Discharge status: Home Health | Payer: Medicare Other | Source: Ambulatory Visit | Attending: Cardiothoracic Surgery | Admitting: Cardiothoracic Surgery

## 2016-01-18 ENCOUNTER — Inpatient Hospital Stay (HOSPITAL_COMMUNITY): Payer: Medicare Other | Admitting: Certified Registered Nurse Anesthetist

## 2016-01-18 ENCOUNTER — Inpatient Hospital Stay (HOSPITAL_COMMUNITY): Payer: Medicare Other

## 2016-01-18 ENCOUNTER — Encounter (HOSPITAL_COMMUNITY)
Admission: RE | Disposition: A | Discharge status: Home Health | Payer: Self-pay | Source: Ambulatory Visit | Attending: Cardiothoracic Surgery

## 2016-01-18 ENCOUNTER — Encounter (HOSPITAL_COMMUNITY): Payer: Self-pay | Admitting: Certified Registered Nurse Anesthetist

## 2016-01-18 DIAGNOSIS — C3411 Malignant neoplasm of upper lobe, right bronchus or lung: Principal | ICD-10-CM | POA: Diagnosis present

## 2016-01-18 DIAGNOSIS — I10 Essential (primary) hypertension: Secondary | ICD-10-CM | POA: Diagnosis present

## 2016-01-18 DIAGNOSIS — H9190 Unspecified hearing loss, unspecified ear: Secondary | ICD-10-CM | POA: Diagnosis present

## 2016-01-18 DIAGNOSIS — J939 Pneumothorax, unspecified: Secondary | ICD-10-CM | POA: Diagnosis not present

## 2016-01-18 DIAGNOSIS — T451X5A Adverse effect of antineoplastic and immunosuppressive drugs, initial encounter: Secondary | ICD-10-CM

## 2016-01-18 DIAGNOSIS — Z902 Acquired absence of lung [part of]: Secondary | ICD-10-CM

## 2016-01-18 DIAGNOSIS — J449 Chronic obstructive pulmonary disease, unspecified: Secondary | ICD-10-CM | POA: Diagnosis not present

## 2016-01-18 DIAGNOSIS — Z87891 Personal history of nicotine dependence: Secondary | ICD-10-CM | POA: Diagnosis not present

## 2016-01-18 DIAGNOSIS — R05 Cough: Secondary | ICD-10-CM | POA: Diagnosis not present

## 2016-01-18 DIAGNOSIS — F329 Major depressive disorder, single episode, unspecified: Secondary | ICD-10-CM | POA: Diagnosis present

## 2016-01-18 DIAGNOSIS — Z85528 Personal history of other malignant neoplasm of kidney: Secondary | ICD-10-CM | POA: Diagnosis not present

## 2016-01-18 DIAGNOSIS — D6481 Anemia due to antineoplastic chemotherapy: Secondary | ICD-10-CM | POA: Diagnosis not present

## 2016-01-18 DIAGNOSIS — Z4682 Encounter for fitting and adjustment of non-vascular catheter: Secondary | ICD-10-CM | POA: Diagnosis not present

## 2016-01-18 DIAGNOSIS — C3491 Malignant neoplasm of unspecified part of right bronchus or lung: Secondary | ICD-10-CM

## 2016-01-18 DIAGNOSIS — Z9689 Presence of other specified functional implants: Secondary | ICD-10-CM

## 2016-01-18 DIAGNOSIS — R0602 Shortness of breath: Secondary | ICD-10-CM | POA: Diagnosis not present

## 2016-01-18 DIAGNOSIS — C771 Secondary and unspecified malignant neoplasm of intrathoracic lymph nodes: Secondary | ICD-10-CM | POA: Diagnosis not present

## 2016-01-18 DIAGNOSIS — E785 Hyperlipidemia, unspecified: Secondary | ICD-10-CM | POA: Diagnosis present

## 2016-01-18 DIAGNOSIS — G620 Drug-induced polyneuropathy: Secondary | ICD-10-CM

## 2016-01-18 DIAGNOSIS — R918 Other nonspecific abnormal finding of lung field: Secondary | ICD-10-CM | POA: Diagnosis not present

## 2016-01-18 DIAGNOSIS — C3490 Malignant neoplasm of unspecified part of unspecified bronchus or lung: Secondary | ICD-10-CM | POA: Diagnosis not present

## 2016-01-18 DIAGNOSIS — H409 Unspecified glaucoma: Secondary | ICD-10-CM | POA: Diagnosis present

## 2016-01-18 DIAGNOSIS — F419 Anxiety disorder, unspecified: Secondary | ICD-10-CM | POA: Diagnosis present

## 2016-01-18 DIAGNOSIS — D62 Acute posthemorrhagic anemia: Secondary | ICD-10-CM | POA: Diagnosis not present

## 2016-01-18 DIAGNOSIS — Z09 Encounter for follow-up examination after completed treatment for conditions other than malignant neoplasm: Secondary | ICD-10-CM

## 2016-01-18 DIAGNOSIS — M5432 Sciatica, left side: Secondary | ICD-10-CM

## 2016-01-18 DIAGNOSIS — J9811 Atelectasis: Secondary | ICD-10-CM | POA: Diagnosis not present

## 2016-01-18 DIAGNOSIS — M4316 Spondylolisthesis, lumbar region: Secondary | ICD-10-CM

## 2016-01-18 HISTORY — PX: VIDEO ASSISTED THORACOSCOPY (VATS)/WEDGE RESECTION: SHX6174

## 2016-01-18 HISTORY — PX: VIDEO BRONCHOSCOPY: SHX5072

## 2016-01-18 LAB — PREPARE RBC (CROSSMATCH)

## 2016-01-18 SURGERY — BRONCHOSCOPY, VIDEO-ASSISTED
Anesthesia: General | Site: Chest | Laterality: Right

## 2016-01-18 MED ORDER — MOMETASONE FURO-FORMOTEROL FUM 200-5 MCG/ACT IN AERO
2.0000 | INHALATION_SPRAY | Freq: Two times a day (BID) | RESPIRATORY_TRACT | Status: DC
  Administered 2016-01-18 – 2016-01-24 (×9): 2 via RESPIRATORY_TRACT
  Filled 2016-01-18 (×3): qty 8.8

## 2016-01-18 MED ORDER — POTASSIUM CHLORIDE IN NACL 20-0.45 MEQ/L-% IV SOLN
INTRAVENOUS | Status: DC
  Administered 2016-01-18 – 2016-01-21 (×3): via INTRAVENOUS
  Filled 2016-01-18 (×8): qty 1000

## 2016-01-18 MED ORDER — PROPOFOL 10 MG/ML IV BOLUS
INTRAVENOUS | Status: DC | PRN
  Administered 2016-01-18: 40 mg via INTRAVENOUS
  Administered 2016-01-18: 120 mg via INTRAVENOUS

## 2016-01-18 MED ORDER — SUGAMMADEX SODIUM 200 MG/2ML IV SOLN
INTRAVENOUS | Status: AC
  Filled 2016-01-18: qty 2

## 2016-01-18 MED ORDER — IRBESARTAN 150 MG PO TABS
75.0000 mg | ORAL_TABLET | Freq: Every day | ORAL | Status: DC
  Administered 2016-01-19 – 2016-01-24 (×5): 75 mg via ORAL
  Filled 2016-01-18 (×10): qty 1

## 2016-01-18 MED ORDER — BENZONATATE 100 MG PO CAPS
200.0000 mg | ORAL_CAPSULE | Freq: Three times a day (TID) | ORAL | Status: DC | PRN
  Administered 2016-01-19: 200 mg via ORAL
  Filled 2016-01-18: qty 2

## 2016-01-18 MED ORDER — EPHEDRINE SULFATE 50 MG/ML IJ SOLN
INTRAMUSCULAR | Status: AC
  Filled 2016-01-18: qty 1

## 2016-01-18 MED ORDER — POLYVINYL ALCOHOL-POVIDONE 1.4-0.6 % OP SOLN: 2.0000 [drp] | Freq: Every day | OPHTHALMIC | Status: DC | PRN

## 2016-01-18 MED ORDER — STERILE WATER FOR INJECTION IJ SOLN
INTRAMUSCULAR | Status: AC
  Filled 2016-01-18: qty 10

## 2016-01-18 MED ORDER — OMEGA-3 FATTY ACIDS 1000 MG PO CAPS: 1.0000 | ORAL_CAPSULE | Freq: Every day | ORAL | Status: DC

## 2016-01-18 MED ORDER — LIDOCAINE HCL (CARDIAC) 20 MG/ML IV SOLN
INTRAVENOUS | Status: AC
  Filled 2016-01-18: qty 5

## 2016-01-18 MED ORDER — LORAZEPAM 0.5 MG PO TABS: 0.5000 mg | ORAL_TABLET | Freq: Two times a day (BID) | ORAL | Status: DC | PRN

## 2016-01-18 MED ORDER — DEXTROSE 5 % IV SOLN
1.5000 g | Freq: Two times a day (BID) | INTRAVENOUS | Status: AC
  Administered 2016-01-18 – 2016-01-19 (×2): 1.5 g via INTRAVENOUS
  Filled 2016-01-18 (×2): qty 1.5

## 2016-01-18 MED ORDER — LEVALBUTEROL HCL 0.63 MG/3ML IN NEBU
0.6300 mg | INHALATION_SOLUTION | Freq: Four times a day (QID) | RESPIRATORY_TRACT | Status: DC
  Administered 2016-01-18 – 2016-01-22 (×16): 0.63 mg via RESPIRATORY_TRACT
  Filled 2016-01-18 (×16): qty 3

## 2016-01-18 MED ORDER — SUCCINYLCHOLINE CHLORIDE 20 MG/ML IJ SOLN
INTRAMUSCULAR | Status: AC
  Filled 2016-01-18: qty 1

## 2016-01-18 MED ORDER — HYDROMORPHONE HCL 1 MG/ML IJ SOLN
0.2500 mg | INTRAMUSCULAR | Status: DC | PRN
  Administered 2016-01-18 (×2): 0.5 mg via INTRAVENOUS

## 2016-01-18 MED ORDER — HYDROMORPHONE HCL 1 MG/ML IJ SOLN
INTRAMUSCULAR | Status: AC
  Filled 2016-01-18: qty 1

## 2016-01-18 MED ORDER — PHENYLEPHRINE 40 MCG/ML (10ML) SYRINGE FOR IV PUSH (FOR BLOOD PRESSURE SUPPORT)
PREFILLED_SYRINGE | INTRAVENOUS | Status: AC
  Filled 2016-01-18: qty 10

## 2016-01-18 MED ORDER — FENTANYL CITRATE (PF) 250 MCG/5ML IJ SOLN
INTRAMUSCULAR | Status: AC
  Filled 2016-01-18: qty 5

## 2016-01-18 MED ORDER — MIDAZOLAM HCL 2 MG/2ML IJ SOLN
INTRAMUSCULAR | Status: AC
  Filled 2016-01-18: qty 2

## 2016-01-18 MED ORDER — LACTATED RINGERS IV SOLN
INTRAVENOUS | Status: DC | PRN
  Administered 2016-01-18 (×2): via INTRAVENOUS

## 2016-01-18 MED ORDER — ACETAMINOPHEN 160 MG/5ML PO SOLN: 1000.0000 mg | Freq: Four times a day (QID) | ORAL | Status: AC

## 2016-01-18 MED ORDER — FENTANYL 40 MCG/ML IV SOLN
INTRAVENOUS | Status: DC
  Administered 2016-01-18: 40 ug via INTRAVENOUS
  Administered 2016-01-18 – 2016-01-19 (×2): via INTRAVENOUS
  Administered 2016-01-19 (×2): 160 ug via INTRAVENOUS
  Administered 2016-01-19: 100 ug via INTRAVENOUS
  Administered 2016-01-19: 10 ug via INTRAVENOUS
  Administered 2016-01-19: 80 ug via INTRAVENOUS
  Administered 2016-01-20: 110 ug via INTRAVENOUS
  Administered 2016-01-20: 130 ug via INTRAVENOUS
  Administered 2016-01-20: 80 ug via INTRAVENOUS
  Administered 2016-01-20: 180 ug via INTRAVENOUS
  Administered 2016-01-20: 20 ug via INTRAVENOUS
  Administered 2016-01-20: 100 ug via INTRAVENOUS
  Administered 2016-01-21: 80 ug via INTRAVENOUS
  Administered 2016-01-21: 140 ug via INTRAVENOUS
  Administered 2016-01-21: 50 ug via INTRAVENOUS
  Filled 2016-01-18: qty 25

## 2016-01-18 MED ORDER — SUGAMMADEX SODIUM 200 MG/2ML IV SOLN
INTRAVENOUS | Status: DC | PRN
  Administered 2016-01-18: 200 mg via INTRAVENOUS

## 2016-01-18 MED ORDER — ONDANSETRON HCL 4 MG/2ML IJ SOLN
4.0000 mg | Freq: Four times a day (QID) | INTRAMUSCULAR | Status: DC | PRN
Start: 2016-01-18 — End: 2016-01-18

## 2016-01-18 MED ORDER — LIDOCAINE HCL (CARDIAC) 20 MG/ML IV SOLN
INTRAVENOUS | Status: DC | PRN
  Administered 2016-01-18: 100 mg via INTRAVENOUS

## 2016-01-18 MED ORDER — ALBUTEROL SULFATE (2.5 MG/3ML) 0.083% IN NEBU: 3.0000 mL | INHALATION_SOLUTION | RESPIRATORY_TRACT | Status: DC | PRN

## 2016-01-18 MED ORDER — 0.9 % SODIUM CHLORIDE (POUR BTL) OPTIME
TOPICAL | Status: DC | PRN
  Administered 2016-01-18: 2000 mL

## 2016-01-18 MED ORDER — OXYCODONE HCL 5 MG PO TABS
5.0000 mg | ORAL_TABLET | ORAL | Status: DC | PRN
  Administered 2016-01-18: 10 mg via ORAL
  Administered 2016-01-21 (×2): 5 mg via ORAL
  Administered 2016-01-22 – 2016-01-23 (×2): 10 mg via ORAL
  Administered 2016-01-23 (×2): 5 mg via ORAL
  Administered 2016-01-24: 10 mg via ORAL
  Administered 2016-01-24: 5 mg via ORAL
  Filled 2016-01-18 (×3): qty 1
  Filled 2016-01-18: qty 2
  Filled 2016-01-18: qty 1
  Filled 2016-01-18: qty 2
  Filled 2016-01-18 (×5): qty 1

## 2016-01-18 MED ORDER — HEMOSTATIC AGENTS (NO CHARGE) OPTIME
TOPICAL | Status: DC | PRN
  Administered 2016-01-18: 1 via TOPICAL

## 2016-01-18 MED ORDER — OMEGA-3-ACID ETHYL ESTERS 1 G PO CAPS
1.0000 g | ORAL_CAPSULE | Freq: Every day | ORAL | Status: DC
  Administered 2016-01-23 – 2016-01-24 (×2): 1 g via ORAL
  Filled 2016-01-18 (×5): qty 1

## 2016-01-18 MED ORDER — LACTATED RINGERS IV SOLN
INTRAVENOUS | Status: DC | PRN
  Administered 2016-01-18: 08:00:00 via INTRAVENOUS

## 2016-01-18 MED ORDER — SENNOSIDES-DOCUSATE SODIUM 8.6-50 MG PO TABS
1.0000 | ORAL_TABLET | Freq: Every day | ORAL | Status: DC
  Administered 2016-01-19 – 2016-01-23 (×3): 1 via ORAL
  Filled 2016-01-18 (×3): qty 1

## 2016-01-18 MED ORDER — SODIUM CHLORIDE 0.9% FLUSH: 9.0000 mL | INTRAVENOUS | Status: DC | PRN

## 2016-01-18 MED ORDER — NALOXONE HCL 0.4 MG/ML IJ SOLN: 0.4000 mg | INTRAMUSCULAR | Status: DC | PRN

## 2016-01-18 MED ORDER — PROPOFOL 10 MG/ML IV BOLUS
INTRAVENOUS | Status: AC
  Filled 2016-01-18: qty 20

## 2016-01-18 MED ORDER — DIPHENHYDRAMINE HCL 12.5 MG/5ML PO ELIX: 12.5000 mg | ORAL_SOLUTION | Freq: Four times a day (QID) | ORAL | Status: DC | PRN

## 2016-01-18 MED ORDER — POTASSIUM CHLORIDE 10 MEQ/50ML IV SOLN: 10.0000 meq | Freq: Every day | INTRAVENOUS | Status: DC | PRN

## 2016-01-18 MED ORDER — POLYVINYL ALCOHOL 1.4 % OP SOLN: 2.0000 [drp] | OPHTHALMIC | Status: DC | PRN

## 2016-01-18 MED ORDER — EPHEDRINE SULFATE 50 MG/ML IJ SOLN
INTRAMUSCULAR | Status: DC | PRN
  Administered 2016-01-18: 5 mg via INTRAVENOUS

## 2016-01-18 MED ORDER — NEOSTIGMINE METHYLSULFATE 10 MG/10ML IV SOLN
INTRAVENOUS | Status: AC
  Filled 2016-01-18: qty 1

## 2016-01-18 MED ORDER — FENTANYL 40 MCG/ML IV SOLN
INTRAVENOUS | Status: AC
  Administered 2016-01-18: 100 ug
  Filled 2016-01-18: qty 25

## 2016-01-18 MED ORDER — BUPIVACAINE 0.5 % ON-Q PUMP SINGLE CATH 400 ML
400.0000 mL | INJECTION | Status: DC
  Administered 2016-01-18: 400 mL
  Filled 2016-01-18: qty 400

## 2016-01-18 MED ORDER — ONDANSETRON HCL 4 MG/2ML IJ SOLN
INTRAMUSCULAR | Status: AC
  Filled 2016-01-18: qty 2

## 2016-01-18 MED ORDER — TRAMADOL HCL 50 MG PO TABS
50.0000 mg | ORAL_TABLET | Freq: Four times a day (QID) | ORAL | Status: DC | PRN
  Administered 2016-01-21 – 2016-01-22 (×2): 50 mg via ORAL
  Administered 2016-01-22: 100 mg via ORAL
  Administered 2016-01-22: 50 mg via ORAL
  Filled 2016-01-18 (×3): qty 1
  Filled 2016-01-18: qty 2
  Filled 2016-01-18: qty 1

## 2016-01-18 MED ORDER — METOCLOPRAMIDE HCL 5 MG/ML IJ SOLN
10.0000 mg | Freq: Four times a day (QID) | INTRAMUSCULAR | Status: AC
  Administered 2016-01-18 – 2016-01-19 (×4): 10 mg via INTRAVENOUS
  Filled 2016-01-18 (×4): qty 2

## 2016-01-18 MED ORDER — VANCOMYCIN HCL IN DEXTROSE 1-5 GM/200ML-% IV SOLN
1000.0000 mg | Freq: Two times a day (BID) | INTRAVENOUS | Status: AC
  Administered 2016-01-18: 1000 mg via INTRAVENOUS
  Filled 2016-01-18: qty 200

## 2016-01-18 MED ORDER — PHENYLEPHRINE HCL 10 MG/ML IJ SOLN
10.0000 mg | INTRAMUSCULAR | Status: DC | PRN
  Administered 2016-01-18: 20 ug/min via INTRAVENOUS

## 2016-01-18 MED ORDER — ATORVASTATIN CALCIUM 40 MG PO TABS
40.0000 mg | ORAL_TABLET | Freq: Every day | ORAL | Status: DC
  Administered 2016-01-19 – 2016-01-24 (×6): 40 mg via ORAL
  Filled 2016-01-18 (×6): qty 1

## 2016-01-18 MED ORDER — ASPIRIN EC 81 MG PO TBEC
81.0000 mg | DELAYED_RELEASE_TABLET | Freq: Every day | ORAL | Status: DC
  Administered 2016-01-19 – 2016-01-24 (×6): 81 mg via ORAL
  Filled 2016-01-18 (×6): qty 1

## 2016-01-18 MED ORDER — FLUTICASONE PROPIONATE 50 MCG/ACT NA SUSP: 2.0000 | Freq: Every day | NASAL | Status: DC | PRN

## 2016-01-18 MED ORDER — FENTANYL CITRATE (PF) 100 MCG/2ML IJ SOLN
INTRAMUSCULAR | Status: DC | PRN
  Administered 2016-01-18 (×3): 50 ug via INTRAVENOUS
  Administered 2016-01-18: 75 ug via INTRAVENOUS
  Administered 2016-01-18: 25 ug via INTRAVENOUS
  Administered 2016-01-18 (×2): 50 ug via INTRAVENOUS

## 2016-01-18 MED ORDER — PROMETHAZINE HCL 25 MG/ML IJ SOLN: 6.2500 mg | INTRAMUSCULAR | Status: DC | PRN

## 2016-01-18 MED ORDER — ROCURONIUM BROMIDE 100 MG/10ML IV SOLN
INTRAVENOUS | Status: DC | PRN
  Administered 2016-01-18 (×6): 10 mg via INTRAVENOUS
  Administered 2016-01-18: 50 mg via INTRAVENOUS
  Administered 2016-01-18 (×2): 10 mg via INTRAVENOUS

## 2016-01-18 MED ORDER — ONDANSETRON HCL 4 MG/2ML IJ SOLN
4.0000 mg | Freq: Four times a day (QID) | INTRAMUSCULAR | Status: DC | PRN
Start: 2016-01-18 — End: 2016-01-24
  Administered 2016-01-19 – 2016-01-21 (×4): 4 mg via INTRAVENOUS
  Filled 2016-01-18 (×4): qty 2

## 2016-01-18 MED ORDER — ONDANSETRON HCL 4 MG/2ML IJ SOLN
INTRAMUSCULAR | Status: DC | PRN
  Administered 2016-01-18: 4 mg via INTRAVENOUS

## 2016-01-18 MED ORDER — GLYCOPYRROLATE 0.2 MG/ML IJ SOLN
INTRAMUSCULAR | Status: AC
  Filled 2016-01-18: qty 3

## 2016-01-18 MED ORDER — DIPHENHYDRAMINE HCL 50 MG/ML IJ SOLN: 12.5000 mg | Freq: Four times a day (QID) | INTRAMUSCULAR | Status: DC | PRN

## 2016-01-18 MED ORDER — ACETAMINOPHEN 500 MG PO TABS
1000.0000 mg | ORAL_TABLET | Freq: Four times a day (QID) | ORAL | Status: AC
  Administered 2016-01-18 – 2016-01-23 (×15): 1000 mg via ORAL
  Filled 2016-01-18 (×16): qty 2

## 2016-01-18 MED ORDER — METOPROLOL TARTRATE 1 MG/ML IV SOLN
INTRAVENOUS | Status: DC | PRN
  Administered 2016-01-18 (×2): 1 mg via INTRAVENOUS

## 2016-01-18 MED ORDER — ROCURONIUM BROMIDE 50 MG/5ML IV SOLN
INTRAVENOUS | Status: AC
  Filled 2016-01-18: qty 1

## 2016-01-18 MED ORDER — MEPERIDINE HCL 25 MG/ML IJ SOLN: 6.2500 mg | INTRAMUSCULAR | Status: DC | PRN

## 2016-01-18 MED ORDER — BISACODYL 5 MG PO TBEC
10.0000 mg | DELAYED_RELEASE_TABLET | Freq: Every day | ORAL | Status: DC
  Administered 2016-01-19 – 2016-01-24 (×5): 10 mg via ORAL
  Filled 2016-01-18 (×5): qty 2

## 2016-01-18 SURGICAL SUPPLY — 96 items
APPLICATOR TIP COSEAL (VASCULAR PRODUCTS) IMPLANT
APPLICATOR TIP EXT COSEAL (VASCULAR PRODUCTS) ×6 IMPLANT
BLADE SURG 10 STRL SS (BLADE) ×3 IMPLANT
BLADE SURG 11 STRL SS (BLADE) ×3 IMPLANT
BRUSH CYTOL CELLEBRITY 1.5X140 (MISCELLANEOUS) IMPLANT
CANISTER SUCTION 2500CC (MISCELLANEOUS) ×3 IMPLANT
CATH KIT ON Q 5IN SLV (PAIN MANAGEMENT) ×3 IMPLANT
CATH THORACIC 28FR (CATHETERS) IMPLANT
CATH THORACIC 36FR (CATHETERS) IMPLANT
CATH THORACIC 36FR RT ANG (CATHETERS) IMPLANT
CLIP TI MEDIUM 24 (CLIP) ×3 IMPLANT
CLIP TI MEDIUM 6 (CLIP) ×3 IMPLANT
CONN ST 1/4X3/8  BEN (MISCELLANEOUS)
CONN ST 1/4X3/8 BEN (MISCELLANEOUS) IMPLANT
CONT SPEC 4OZ CLIKSEAL STRL BL (MISCELLANEOUS) ×36 IMPLANT
COVER TABLE BACK 60X90 (DRAPES) ×3 IMPLANT
DERMABOND ADVANCED (GAUZE/BANDAGES/DRESSINGS)
DERMABOND ADVANCED .7 DNX12 (GAUZE/BANDAGES/DRESSINGS) IMPLANT
DRAIN CHANNEL 28F RND 3/8 FF (WOUND CARE) IMPLANT
DRAIN CHANNEL 32F RND 10.7 FF (WOUND CARE) IMPLANT
DRAPE LAPAROSCOPIC ABDOMINAL (DRAPES) ×3 IMPLANT
DRAPE WARM FLUID 44X44 (DRAPE) ×3 IMPLANT
DRILL BIT 7/64X5 (BIT) ×3 IMPLANT
ELECT BLADE 4.0 EZ CLEAN MEGAD (MISCELLANEOUS) ×6
ELECT REM PT RETURN 9FT ADLT (ELECTROSURGICAL) ×3
ELECTRODE BLDE 4.0 EZ CLN MEGD (MISCELLANEOUS) ×4 IMPLANT
ELECTRODE REM PT RTRN 9FT ADLT (ELECTROSURGICAL) ×2 IMPLANT
FORCEPS BIOP RJ4 1.8 (CUTTING FORCEPS) IMPLANT
GAUZE SPONGE 4X4 12PLY STRL (GAUZE/BANDAGES/DRESSINGS) ×6 IMPLANT
GLOVE BIO SURGEON STRL SZ 6 (GLOVE) ×6 IMPLANT
GLOVE BIO SURGEON STRL SZ 6.5 (GLOVE) ×6 IMPLANT
GOWN STRL REUS W/ TWL LRG LVL3 (GOWN DISPOSABLE) ×8 IMPLANT
GOWN STRL REUS W/TWL LRG LVL3 (GOWN DISPOSABLE) ×4
HEMOSTAT SURGICEL 2X14 (HEMOSTASIS) ×3 IMPLANT
KIT BASIN OR (CUSTOM PROCEDURE TRAY) ×3 IMPLANT
KIT CLEAN ENDO COMPLIANCE (KITS) IMPLANT
KIT ROOM TURNOVER OR (KITS) ×3 IMPLANT
KIT SUCTION CATH 14FR (SUCTIONS) ×3 IMPLANT
MARKER SKIN DUAL TIP RULER LAB (MISCELLANEOUS) IMPLANT
NEEDLE BIOPSY TRANSBRONCH 21G (NEEDLE) IMPLANT
NS IRRIG 1000ML POUR BTL (IV SOLUTION) ×6 IMPLANT
OIL SILICONE PENTAX (PARTS (SERVICE/REPAIRS)) ×3 IMPLANT
PACK CHEST (CUSTOM PROCEDURE TRAY) ×3 IMPLANT
PAD ARMBOARD 7.5X6 YLW CONV (MISCELLANEOUS) ×9 IMPLANT
PASSER SUT SWANSON 36MM LOOP (INSTRUMENTS) ×3 IMPLANT
POUCH ENDO CATCH II 15MM (MISCELLANEOUS) ×3 IMPLANT
RELOAD GOLD ECHELON 45 (STAPLE) ×9 IMPLANT
RELOAD STAPLER LINE PROX 30 GR (STAPLE) ×2 IMPLANT
SCISSORS LAP 5X35 DISP (ENDOMECHANICALS) IMPLANT
SEALANT PROGEL (MISCELLANEOUS) IMPLANT
SEALANT SURG COSEAL 4ML (VASCULAR PRODUCTS) IMPLANT
SEALANT SURG COSEAL 8ML (VASCULAR PRODUCTS) ×3 IMPLANT
SOLUTION ANTI FOG 6CC (MISCELLANEOUS) ×3 IMPLANT
SPONGE GAUZE 4X4 12PLY STER LF (GAUZE/BANDAGES/DRESSINGS) ×3 IMPLANT
SPONGE INTESTINAL PEANUT (DISPOSABLE) ×3 IMPLANT
SPONGE TONSIL 1 RF SGL (DISPOSABLE) ×3 IMPLANT
STAPLE RELOAD 2.5MM WHITE (STAPLE) ×12 IMPLANT
STAPLER ECHELON POWERED (MISCELLANEOUS) ×3 IMPLANT
STAPLER RELOAD LINE PROX 30 GR (STAPLE) ×3
STAPLER VASCULAR ECHELON 35 (CUTTER) ×3 IMPLANT
SUT PROLENE 3 0 SH DA (SUTURE) IMPLANT
SUT PROLENE 4 0 RB 1 (SUTURE) ×1
SUT PROLENE 4-0 RB1 .5 CRCL 36 (SUTURE) ×2 IMPLANT
SUT SILK  1 MH (SUTURE) ×4
SUT SILK 1 MH (SUTURE) ×8 IMPLANT
SUT SILK 1 TIES 10X30 (SUTURE) IMPLANT
SUT SILK 2 0 SH (SUTURE) IMPLANT
SUT SILK 2 0SH CR/8 30 (SUTURE) IMPLANT
SUT SILK 3 0SH CR/8 30 (SUTURE) ×3 IMPLANT
SUT STEEL 1 (SUTURE) IMPLANT
SUT VIC AB 1 CTX 18 (SUTURE) ×3 IMPLANT
SUT VIC AB 1 CTX 36 (SUTURE)
SUT VIC AB 1 CTX36XBRD ANBCTR (SUTURE) IMPLANT
SUT VIC AB 2-0 CTX 36 (SUTURE) ×3 IMPLANT
SUT VIC AB 2-0 UR6 27 (SUTURE) IMPLANT
SUT VIC AB 3-0 SH 8-18 (SUTURE) IMPLANT
SUT VIC AB 3-0 X1 27 (SUTURE) ×3 IMPLANT
SUT VICRYL 0 UR6 27IN ABS (SUTURE) IMPLANT
SUT VICRYL 2 TP 1 (SUTURE) ×3 IMPLANT
SWAB COLLECTION DEVICE MRSA (MISCELLANEOUS) IMPLANT
SYR 20ML ECCENTRIC (SYRINGE) ×3 IMPLANT
SYSTEM SAHARA CHEST DRAIN ATS (WOUND CARE) ×3 IMPLANT
TAPE CLOTH SURG 4X10 WHT LF (GAUZE/BANDAGES/DRESSINGS) ×3 IMPLANT
TAPE UMBILICAL COTTON 1/8X30 (MISCELLANEOUS) ×3 IMPLANT
TIP APPLICATOR SPRAY EXTEND 16 (VASCULAR PRODUCTS) IMPLANT
TOWEL OR 17X24 6PK STRL BLUE (TOWEL DISPOSABLE) ×6 IMPLANT
TOWEL OR 17X26 10 PK STRL BLUE (TOWEL DISPOSABLE) ×6 IMPLANT
TRAP SPECIMEN MUCOUS 40CC (MISCELLANEOUS) ×3 IMPLANT
TRAY FOLEY CATH 16FRSI W/METER (SET/KITS/TRAYS/PACK) ×3 IMPLANT
TROCAR FLEXIPATH THORACIC 15MM (ENDOMECHANICALS) ×3 IMPLANT
TROCAR XCEL BLUNT TIP 100MML (ENDOMECHANICALS) IMPLANT
TUBE ANAEROBIC SPECIMEN COL (MISCELLANEOUS) IMPLANT
TUBE CONNECTING 20X1/4 (TUBING) ×3 IMPLANT
TUNNELER SHEATH ON-Q 11GX8 DSP (PAIN MANAGEMENT) ×3 IMPLANT
WATER STERILE IRR 1000ML POUR (IV SOLUTION) ×6 IMPLANT
YANKAUER SUCT BULB TIP NO VENT (SUCTIONS) ×3 IMPLANT

## 2016-01-18 NOTE — Progress Notes (Signed)
Report given to diane walker rn as caregiver 

## 2016-01-18 NOTE — Brief Op Note (Addendum)
      [address]       [place] [address]             [phone]      01/18/2016  1:35 PM  PATIENT:  Tammy Ball  75 y.o. female  PRE-OPERATIVE DIAGNOSIS:  RUL MASS-SQUAMOUS CELL CARCINOMA. LUNG CANCER  POST-OPERATIVE DIAGNOSIS:  RUL MASS LUNG CANCER  PROCEDURE:  Procedure(s):  VIDEO BRONCHOSCOPY (N/A)  VIDEO ASSISTED THORACOSCOPY/MINI THORACOTOMY -Right Upper Lobectomy -Lymph Node Dissection -Insertion of ON-Q  SURGEON:  Surgeon(s) and Role:    * [name], MD - Primary  PHYSICIAN ASSISTANT: Erin Barrett PA-C  ANESTHESIA:   general  EBL:  Total I/O In: 1000 [I.V.:1000] Out: 775 [Urine:575; Blood:200]  BLOOD ADMINISTERED:none  DRAINS: 28 Straight, 32 Blake Drain   LOCAL MEDICATIONS USED:  MARCAINE     SPECIMEN:  Source of Specimen:  Right Upper Lobe and multiple lymph nodes   DISPOSITION OF SPECIMEN:  PATHOLOGY  COUNTS:  YES  DICTATION: .Dragon Dictation  PLAN OF CARE: Admit to inpatient   PATIENT DISPOSITION:  ICU - extubated and stable.   Delay start of Pharmacological VTE agent (>24hrs) due to surgical blood loss or risk of bleeding: yes

## 2016-01-18 NOTE — Anesthesia Procedure Notes (Addendum)
Procedure Name: Intubation Date/Time: 01/18/2016 8:47 AM Performed by: Shirlyn Goltz Pre-anesthesia Checklist: Patient identified, Emergency Drugs available, Suction available and Patient being monitored Patient Re-evaluated:Patient Re-evaluated prior to inductionOxygen Delivery Method: Circle system utilized Preoxygenation: Pre-oxygenation with 100% oxygen Intubation Type: IV induction Ventilation: Mask ventilation without difficulty and Oral airway inserted - appropriate to patient size Laryngoscope Size: Mac and 3 Grade View: Grade II Tube type: Oral Tube size: 8.5 mm Number of attempts: 1 Airway Equipment and Method: Stylet Placement Confirmation: ETT inserted through vocal cords under direct vision,  positive ETCO2 and breath sounds checked- equal and bilateral Secured at: 22 cm Tube secured with: Tape Dental Injury: Teeth and Oropharynx as per pre-operative assessment    Central Venous Catheter Insertion Performed by: anesthesiologist 01/18/2016 8:22 AM Patient location: Pre-op. Preanesthetic checklist: patient identified, IV checked, site marked, risks and benefits discussed, surgical consent, monitors and equipment checked, pre-op evaluation, timeout performed and anesthesia consent Position: Trendelenburg Lidocaine 1% used for infiltration Landmarks identified and Seldinger technique used Catheter size: 8 Fr Central line was placed.Double lumen Procedure performed using ultrasound guided technique. Attempts: 1 Following insertion, dressing applied, line sutured and Biopatch. Post procedure assessment: blood return through all ports. Patient tolerated the procedure well with no immediate complications.

## 2016-01-18 NOTE — Progress Notes (Signed)
CT surgery p.m. Rounds  Stable after right upper lobectomy today Minimal chest tube drainage Patient breathing comfortably Vital signs stable, oxygen saturation greater than 95%

## 2016-01-18 NOTE — Transfer of Care (Signed)
2Immediate Anesthesia Transfer of Care Note  Patient: Tammy Ball  Procedure(s) Performed: Procedure(s): VIDEO BRONCHOSCOPY (N/A) VIDEO ASSISTED THORACOSCOPY (VATS)/LUNG RESECTION, THOROCOTOMY, RIGHT UPPER LOBECTOMY, LYMPH NODE DISSECTION, PLACEMENT OF ON Q (Right)  Patient Location: PACU  Anesthesia Type:General  Level of Consciousness: awake, alert , oriented and patient cooperative  Airway & Oxygen Therapy: Patient Spontanous Breathing and Patient connected to face mask oxygen  Post-op Assessment: Report given to RN, Post -op Vital signs reviewed and stable, Patient moving all extremities and Patient moving all extremities X 4  Post vital signs: Reviewed and stable  Last Vitals:  Filed Vitals:   01/18/16 0639  BP: 140/89  Pulse: 98  Temp: 36.9 C  Resp: 20    Complications: No apparent anesthesia complications

## 2016-01-18 NOTE — Anesthesia Postprocedure Evaluation (Signed)
Anesthesia Post Note  Patient: Tammy Ball  Procedure(s) Performed: Procedure(s) (LRB): VIDEO BRONCHOSCOPY (N/A) VIDEO ASSISTED THORACOSCOPY (VATS)/LUNG RESECTION, THOROCOTOMY, RIGHT UPPER LOBECTOMY, LYMPH NODE DISSECTION, PLACEMENT OF ON Q (Right)  Patient location during evaluation: PACU Anesthesia Type: General Level of consciousness: sedated and patient cooperative Pain management: pain level controlled Vital Signs Assessment: post-procedure vital signs reviewed and stable Respiratory status: spontaneous breathing Cardiovascular status: stable Anesthetic complications: no    Last Vitals:  Filed Vitals:   01/18/16 1545 01/18/16 1600  BP:    Pulse: 79 73  Temp:    Resp: 9 6    Last Pain:  Filed Vitals:   01/18/16 1605  PainSc: Asleep                 Nolon Nations

## 2016-01-18 NOTE — H&P (Signed)
[address]       [place] [address]             [phone]                      Tammy Ball [employer] Medical Record #[id number] Date of Birth: 1941-02-06  Referring: No ref. provider found Primary Care: [name], MD  Chief Complaint:    Cancer right lung    History of Present Illness:    Tammy Ball 75 y.o. female has been followed in the office   for diagnosis of Lung Cancer., she has now completed 3 cycles of chemo. Squamous cell with large right upper lobe mass diagnosed  nov 2016  She has history   h/o copd gold II , frequent sinus infections, hypertension, She present to ER in Nov  with 2 days of fevers and chills, sob and cough. On arrival to ED, she was febrile, tachycardic, hypotensive, hypoxic and witht elevated lactic acid.  She was found to have pneumonia on CXR. She was referred to medical service for sepsis from CAP. Bronchoscopy by Dr Elsworth Soho revealed whitish endobrochial mass seen blocking entry of RUl bronchus.      DIAGNOSIS: Stage IIA (T2a, N1, M0) non-small cell lung cancer, squamous cell carcinoma presented with large right upper lobe lung mass with right hilar involvement diagnosed in November 2016.  PRIOR THERAPY: Neoadjuvant systemic chemotherapy with carboplatin for AUC of 6 and paclitaxel 200 MG/M2 every 3 weeks, status post 3 cycles with partial response.   CURRENT THERAPY: None.   Bronchoscopy by Dr Elsworth Soho Airway entered and the following bronchi were examined: Bronchi. Thick white purulent secretions removed. bscope had to be removed due to thick plug & reintroduced. .  Procedures performed: Endobronchial Biopsies x 4 , brushings x 2 & BAL were obtained fromt his area Bronchoscope removed.  Current Activity/ Functional Status:  Patient is independent with mobility/ambulation, transfers, ADL's, IADL's.   Zubrod Score: At the time of surgery this patient's most appropriate activity status/level should  be described as: '[]'$     0    Normal activity, no symptoms '[x]'$     1    Restricted in physical strenuous activity but ambulatory, able to do out light work '[]'$     2    Ambulatory and capable of self care, unable to do work activities, up and about               >50 % of waking hours                              '[]'$     3    Only limited self care, in bed greater than 50% of waking hours '[]'$     4    Completely disabled, no self care, confined to bed or chair '[]'$     5    Moribund   Past Medical History  Diagnosis Date  . COPD (chronic obstructive pulmonary disease) ([place])   . Hypertension   . Depression   . Asthma   . Glaucoma   . Hyperlipidemia   . Heart murmur   . Non-small cell carcinoma of right lung, stage 2 ([place]) 10/03/2015  . Chemotherapy-induced neuropathy ([place]) 11/01/2015  . History of nuclear stress test     Myoview 2/17: no ischemia or scar, EF 79%; low risk  . History of echocardiogram  Echo 2/17: EF 20-94%, grade 1 diastolic dysfunction, mild MR, trivial pericardial effusion  . Renal cell carcinoma ([place])     L nephrectomy  in 2012  . Anxiety   . Claustrophobia   . Headache     prior to menopause  . Anemia     was while doing chemo    Past Surgical History  Procedure Laterality Date  . Nephrectomy    . Kidney cancer    . Tubal ligation    . Spine surgery    . Eye surgery    . Video bronchoscopy Bilateral 09/20/2015    Procedure: VIDEO BRONCHOSCOPY WITHOUT FLUORO;  Surgeon: Rigoberto Noel, MD;  Location: [facility];  Service: Cardiopulmonary;  Laterality: Bilateral;  . Back surgery      cervical 1991    Family History  Problem Relation Age of Onset  . Heart disease Sister   . Obesity Brother   . Heart attack Daughter 24    s/p CABG  . Glaucoma Daughter   . Breast cancer Sister   . Birth defects Sister   . Cancer Mother     Bladder Cancer  . Hypertension Mother   . CAD Mother 72  . Cancer Maternal Grandmother     Social History   Social History  .  Marital Status: Widowed    Spouse Name: N/A  . Number of Children: N/A  . Years of Education: N/A   Occupational History  . Not on file.   Social History Main Topics  . Smoking status: Former Smoker -- 0.50 packs/day for 50 years    Types: Cigarettes    Quit date: 03/16/2011  . Smokeless tobacco: Never Used  . Alcohol Use: No  . Drug Use: No  . Sexual Activity: Not Currently   Other Topics Concern  . Not on file   Social History Narrative   Occupation: Substance Abuse Counselor   Divorced   College Education   No exercise** Merged History Encounter **       ** Data from: 12/14/11 Enc Dept: [facility]       ** Data from: 12/17/11 Enc Dept: [facility]   Substance abuse counselor   Husband deceased   4 great grandchildren   Son works in same substance abuse counseling center as patient    History  Smoking status  . Former Smoker -- 0.50 packs/day for 50 years  . Types: Cigarettes  . Quit date: 03/16/2011  Smokeless tobacco  . Never Used    History  Alcohol Use No     Allergies  Allergen Reactions  . Bee Venom Anaphylaxis, Shortness Of Breath, Swelling and Other (See Comments)    Swelling at site   . Amlodipine Swelling and Other (See Comments)    Swelling of the ankles and hands   . Levofloxacin Other (See Comments)    Joint pain   . Hctz [Hydrochlorothiazide] Palpitations and Other (See Comments)    Sweating     Current Facility-Administered Medications  Medication Dose Route Frequency Provider Last Rate Last Dose  . cefUROXime (ZINACEF) 1.5 g in dextrose 5 % 50 mL IVPB  1.5 g Intravenous To SS-Surg [name], MD          Review of Systems:     Cardiac Review of Systems: Y or N  Chest Pain [  n  ]  Resting SOB [ y  ] Exertional SOB  [ y ]  Orthopnea [ y ]  Pedal Edema [n   ]    Palpitations Florencio.Farrier  ] Syncope  [ n ]   Presyncope [ n  ]  General Review of Systems: [Y] = yes [  ]=no Constitional: recent weight change [ n];  Wt  loss over the last 3 months [   ] anorexia [  ]; fatigue Blue.Reese  ]; nausea [  ]; night sweats [  ]; fever [  ]; or chills [  ];          Dental: poor dentition[  ]; Last Dentist visit:   Eye : blurred vision [  ]; diplopia [   ]; vision changes [  ];  Amaurosis fugax[  ]; Resp: cough Blue.Reese  ];  wheezing[ y ];  hemoptysis[n  ]; shortness of breath[ y ]; paroxysmal nocturnal dyspnea[ y ]; dyspnea on exertion[  ]; or orthopnea[  ];  GI:  gallstones[  ], vomiting[  ];  dysphagia[  ]; melena[  ];  hematochezia [  ]; heartburn[  ];   Hx of  Colonoscopy[  ]; GU: kidney stones [  ]; hematuria[  ];   dysuria [  ];  nocturia[  ];  history of     obstruction [  ]; urinary frequency [  ]             Skin: rash, swelling[  ];, hair loss[  ];  peripheral edema[  ];  or itching[  ]; Musculosketetal: myalgias[  ];  joint swelling[  ];  joint erythema[  ];  joint pain[  ];  back pain[  ];  Heme/Lymph: bruising[  ];  bleeding[  ];  anemia[  ];  Neuro: TIA[n  ];  headaches[  ];  stroke[  ];  vertigo[  ];  seizures[n  ];   paresthesias[  ];  difficulty walking[left leg pain  ];  Psych:depression[  ]; anxiety[  ];  Endocrine: diabetes[  ];  thyroid dysfunction[  ];  Immunizations: Flu up to date Blue.Reese  ]; Pneumococcal up to date Blue.Reese  ];  Other:  Physical Exam: BP 140/89 mmHg  Pulse 98  Temp(Src) 98.4 F (36.9 C) (Oral)  Resp 20  Wt 178 lb 1.6 oz (80.786 kg)  SpO2 97%  PHYSICAL EXAMINATION: General appearance: alert and cooperative Head: Normocephalic, without obvious abnormality, atraumatic Neck: no adenopathy, no carotid bruit, no JVD, supple, symmetrical, trachea midline and thyroid not enlarged, symmetric, no tenderness/mass/nodules Lymph nodes: Cervical, supraclavicular, and axillary nodes normal. Resp: diminished breath sounds bibasilar Back: symmetric, no curvature. ROM normal. No CVA tenderness. Cardio: regular rate and rhythm, S1, S2 normal, no murmur, click, rub or gallop GI: soft, non-tender; bowel sounds  normal; no masses,  no organomegaly Extremities: extremities normal, atraumatic, no cyanosis or edema and Homans sign is negative, no sign of DVT Neurologic: Grossly normal  Diagnostic Studies & Laboratory data:     Recent Radiology Findings:   Ct Chest W Contrast  12/13/2015  CLINICAL DATA:  Non-small-cell lung cancer, right-sided. Left nephrectomy for renal cell carcinoma in 2012. Chemotherapy finishing 11/22/2015. Restaging. EXAM: CT CHEST WITH CONTRAST TECHNIQUE: Multidetector CT imaging of the chest was performed during intravenous contrast administration. CONTRAST:  71m OMNIPAQUE IOHEXOL 300 MG/ML  SOLN COMPARISON:  PET 09/29/2015.  Chest CT 09/19/2015. FINDINGS: Mediastinum/Nodes: No supraclavicular adenopathy. Aortic and branch vessel atherosclerosis. Mild cardiomegaly with mild lipomatous hypertrophy of the interatrial septum. No central pulmonary embolism, on this non-dedicated study. No mediastinal or hilar adenopathy. No residual extension  into the right hilum. Lungs/Pleura: No pleural fluid. Mild centrilobular emphysema. Mild biapical scarring. Significant decrease in size of the central right lower lobe lung lesion. This measures on the order of 2.5 x 2.3 cm on image 18/ series 5. Compare 3.6 x 4.5 cm on 09/19/2015. Significantly greater cavitation today. Clear left lung. Upper abdomen: Normal imaged portions of the liver, spleen, stomach, pancreas, gallbladder, adrenal glands. Left nephrectomy. Normal imaged right kidney. Musculoskeletal: Cervical spine fixation. IMPRESSION: 1. Marked response to therapy. Significant decrease in size of a central right upper lobe pulmonary nodule with increase in cavitation. 2. No evidence of thoracic adenopathy to suggest metastatic disease. 3. Left nephrectomy. Electronically Signed   By: Abigail Miyamoto M.D.   On: 12/13/2015 12:00   Dg Femur Min 2 Views Left  12/10/2015  CLINICAL DATA:  Left leg pain, history of lung carcinoma EXAM: LEFT FEMUR 2 VIEWS  COMPARISON:  None. FINDINGS: There is no evidence of fracture or other focal bone lesions. Soft tissues are unremarkable. IMPRESSION: No acute abnormality noted. Electronically Signed   By: Inez Catalina M.D.   On: 12/10/2015 13:04    CLINICAL DATA: Non-small cell lung cancer. Personal history of renal cell carcinoma.  EXAM: MRI HEAD WITHOUT AND WITH CONTRAST  TECHNIQUE: Multiplanar, multiecho pulse sequences of the brain and surrounding structures were obtained without and with intravenous contrast.  CONTRAST: 89m MULTIHANCE GADOBENATE DIMEGLUMINE 529 MG/ML IV SOLN  COMPARISON: None.  FINDINGS: Scattered periventricular and subcortical T2 changes bilaterally are slightly greater than expected for age. No acute infarct, hemorrhage, or mass lesion is present. The ventricles are of normal size. No significant extraaxial fluid collection is present.  White matter changes extend into the brainstem. The cerebellum is within normal limits.  The internal auditory canals are normal bilaterally. Flow is present in the major intracranial arteries. Bilateral lens replacements are present. The globes and orbits are intact. The paranasal sinuses and mastoid air cells are clear.  The postcontrast images demonstrate no pathologic enhancement to suggest metastatic disease of the brain or meninges.  IMPRESSION: 1. Mild age advanced white matter disease. This likely reflects the sequela of chronic microvascular ischemia. 2. No pathologic enhancement to suggest metastatic disease of the brain or meninges.   Electronically Signed  By: CSan MorelleM.D.  On: 10/21/2015 14:54  I have independently reviewed the above radiologic studies.  Recent Lab Findings: Lab Results  Component Value Date   WBC 7.8 01/16/2016   HGB 12.5 01/16/2016   HCT 39.4 01/16/2016   PLT 440* 01/16/2016   GLUCOSE 113* 01/16/2016   CHOL 174 12/10/2015   TRIG 150* 12/10/2015   HDL 89  12/10/2015   LDLCALC 55 12/10/2015   ALT 19 01/16/2016   AST 21 01/16/2016   NA 141 01/16/2016   K 4.7 01/16/2016   CL 108 01/16/2016   CREATININE 1.12* 01/16/2016   BUN 18 01/16/2016   CO2 22 01/16/2016   TSH 1.583 12/14/2011   INR 1.00 01/16/2016   Full PFT's done  FEV1 1.83  83% dlco 13.83 57%  Interpretation: The FEV1 is normal, but the FEV1/FVC ratio and FEF25-75% are reduced. The increased airway resistance and decreased specific conductance indicate a central airway disease. Lung volumes are within normal limits. Following administration of bronchodilators, there is no significant response. The reduced diffusing capacity indicates a moderate loss of functional alveolar capillary surface. However, the diffusing capacity was not corrected for the patient's hemoglobin. Pulmonary Function Diagnosis: Minimal Obstructive Airways Disease Insignificant response  to bronchodilator Moderate Diffusion Defect 6 Minute Walk Test Results  Patient: Tammy Ball Date:  01/18/2016   Supplemental O2 during test? NONE      Baseline   End  Time   1016    1022 Heartrate  104    124 Dyspnea  0    1 Fatigue  0    3 O2 sat   97%    89% Blood pressure 156/86    176/67   Patient ambulated at a FAST pace for a total distance of 1078 feet with NOstops.  Ambulation was limited primarily due to N/A.  Overall the test was tolerated well  Myoview stress test :  The left ventricular ejection fraction is hyperdynamic (>65%).  Nuclear stress EF: 79%.  There was no ST segment deviation noted during stress.  The study is normal.  Normal stress nuclear study with no ischemia or infarction; EF 79 with normal wall motion.     Nuclear History and Indications    History and Indications Indication for Stress Test: Risk stratification - Preoperative History: No Cardiac History Cardiac Risk Factors: Hypertension  Symptoms: DOE    Stress Findings    ECG Baseline ECG exhibits  normal sinus rhythm..   Stress Findings A pharmacological stress test was performed using IV Lexiscan 0.'4mg'$  over 10 seconds performed without concurrent submaximal exercise.  The patient reported shortness of breath during the stress test.   Response to Stress There was no ST segment deviation noted during stress.  Arrhythmias during stress: occasional PACs.  Arrhythmias during recovery: occasional occasional, PACs.  Arrhythmias were not significant.  ECG was interpretable and there was no significant change from baseline.     Assessment / Plan:   Anemia and thrombocytopenia, likely chemo related resolved  PFT's with 6 min walk test  DONE SEE ABOVE to evaluate physical tolerance  for resection, some limited patient in diffusion capacity, FEV1 is okay for lobectomy. Patient may require sleeve resection, pneumonectomy would be contraindicated with her pulmonary function Patient has seen Cardiology consult /preop clearance,stress test noted above   I have discussed procedure in detail with patient and he son . Plan to proceed with Bronchoscopy and if favorable at bronchoscopy proceed with resection March 8.   The goals risks and alternatives of the planned surgical procedure Bronchoscopy, right VATS , lung resection  have been discussed with the patient in detail. The risks of the procedure including death, infection, stroke, myocardial infarction, bleeding, blood transfusion have all been discussed specifically.  I have quoted [name] a 5% of perioperative mortality and a complication rate as high as 40 %. The patient's questions have been answered.Tammy Ball is willing  to proceed with the planned procedure.   [name] MD      [address] [place] [address] Office [phone]   Beeper [phone]  01/18/2016 7:36 AM

## 2016-01-19 ENCOUNTER — Encounter (HOSPITAL_COMMUNITY): Payer: Self-pay | Admitting: Cardiothoracic Surgery

## 2016-01-19 ENCOUNTER — Inpatient Hospital Stay (HOSPITAL_COMMUNITY): Payer: Medicare Other

## 2016-01-19 LAB — CBC
HCT: 30.4 % — ABNORMAL LOW (ref 36.0–46.0)
Hemoglobin: 9.9 g/dL — ABNORMAL LOW (ref 12.0–15.0)
MCH: 33.2 pg (ref 26.0–34.0)
MCHC: 32.6 g/dL (ref 30.0–36.0)
MCV: 102 fL — ABNORMAL HIGH (ref 78.0–100.0)
Platelets: 280 10*3/uL (ref 150–400)
RBC: 2.98 MIL/uL — ABNORMAL LOW (ref 3.87–5.11)
RDW: 18 % — ABNORMAL HIGH (ref 11.5–15.5)
WBC: 7.7 10*3/uL (ref 4.0–10.5)

## 2016-01-19 LAB — POCT I-STAT 7, (LYTES, BLD GAS, ICA,H+H)
Acid-base deficit: 1 mmol/L (ref 0.0–2.0)
Bicarbonate: 24.2 mEq/L — ABNORMAL HIGH (ref 20.0–24.0)
Calcium, Ion: 1.21 mmol/L (ref 1.13–1.30)
HCT: 30 % — ABNORMAL LOW (ref 36.0–46.0)
Hemoglobin: 10.2 g/dL — ABNORMAL LOW (ref 12.0–15.0)
O2 Saturation: 97 %
Patient temperature: 35.6
Potassium: 4.1 mmol/L (ref 3.5–5.1)
Sodium: 139 mmol/L (ref 135–145)
TCO2: 25 mmol/L (ref 0–100)
pCO2 arterial: 39.9 mmHg (ref 35.0–45.0)
pH, Arterial: 7.384 (ref 7.350–7.450)
pO2, Arterial: 82 mmHg (ref 80.0–100.0)

## 2016-01-19 LAB — BASIC METABOLIC PANEL
Anion gap: 8 (ref 5–15)
BUN: 10 mg/dL (ref 6–20)
CO2: 27 mmol/L (ref 22–32)
Calcium: 8.5 mg/dL — ABNORMAL LOW (ref 8.9–10.3)
Chloride: 105 mmol/L (ref 101–111)
Creatinine, Ser: 0.96 mg/dL (ref 0.44–1.00)
GFR calc Af Amer: >60 mL/min (ref >60)
GFR calc non Af Amer: 58 mL/min — ABNORMAL LOW (ref >60)
Glucose, Bld: 99 mg/dL (ref 65–99)
Potassium: 4.6 mmol/L (ref 3.5–5.1)
Sodium: 140 mmol/L (ref 135–145)

## 2016-01-19 LAB — POCT I-STAT 3, ART BLOOD GAS (G3+)
Acid-Base Excess: 3 mmol/L — ABNORMAL HIGH (ref 0.0–2.0)
Bicarbonate: 29.5 mEq/L — ABNORMAL HIGH (ref 20.0–24.0)
O2 Saturation: 95 %
Patient temperature: 98.4
TCO2: 31 mmol/L (ref 0–100)
pCO2 arterial: 55.5 mmHg — ABNORMAL HIGH (ref 35.0–45.0)
pH, Arterial: 7.333 — ABNORMAL LOW (ref 7.350–7.450)
pO2, Arterial: 81 mmHg (ref 80.0–100.0)

## 2016-01-19 MED ORDER — ENOXAPARIN SODIUM 30 MG/0.3ML ~~LOC~~ SOLN
30.0000 mg | SUBCUTANEOUS | Status: DC
  Administered 2016-01-19 – 2016-01-24 (×6): 30 mg via SUBCUTANEOUS
  Filled 2016-01-19 (×6): qty 0.3

## 2016-01-19 NOTE — Progress Notes (Signed)
Patient ID: Tammy Ball, female   DOB: [birth year], 75 y.o.   MRN: [id number] [facility] DAILY ICU PROGRESS NOTE                   [address]            [place] [address]          [phone]   1 Day Post-Op Procedure(s) (LRB): VIDEO BRONCHOSCOPY (N/A) VIDEO ASSISTED THORACOSCOPY (VATS)/LUNG RESECTION, THOROCOTOMY, RIGHT UPPER LOBECTOMY, LYMPH NODE DISSECTION, PLACEMENT OF ON Q (Right)  Total Length of Stay:  LOS: 1 day   Subjective: Up to chair   Objective: Vital signs in last 24 hours: Temp:  [97.6 F (36.4 C)-98.5 F (36.9 C)] 98.4 F (36.9 C) (03/09 0400) Pulse Rate:  [65-85] 85 (03/09 0600) Cardiac Rhythm:  [-] Normal sinus rhythm (03/09 0400) Resp:  [6-28] 21 (03/09 0600) BP: (90-144)/(46-77) 108/58 mmHg (03/09 0600) SpO2:  [89 %-100 %] 89 % (03/09 0600) Arterial Line BP: (95-172)/(47-83) 95/47 mmHg (03/09 0000) Weight:  [178 lb 1.6 oz (80.786 kg)] 178 lb 1.6 oz (80.786 kg) (03/08 2300)  Filed Weights   01/18/16 0639 01/18/16 2300  Weight: 178 lb 1.6 oz (80.786 kg) 178 lb 1.6 oz (80.786 kg)    Weight change: 0 lb (0 kg)   Hemodynamic parameters for last 24 hours:    Intake/Output from previous day: 03/08 0701 - 03/09 0700 In: 4721.7 [P.O.:120; I.V.:4351.7; IV Piggyback:250] Out: 2845 [Urine:2245; Blood:200; Chest Tube:400]  Intake/Output this shift:    Current Meds: Scheduled Meds: . acetaminophen  1,000 mg Oral 4 times per day   Or  . acetaminophen (TYLENOL) oral liquid 160 mg/5 mL  1,000 mg Oral 4 times per day  . aspirin EC  81 mg Oral Daily  . atorvastatin  40 mg Oral Daily  . bisacodyl  10 mg Oral Daily  . cefUROXime (ZINACEF)  IV  1.5 g Intravenous Q12H  . fentaNYL   Intravenous 6 times per day  . irbesartan  75 mg Oral Daily  . levalbuterol  0.63 mg Nebulization Q6H  . metoCLOPramide (REGLAN) injection  10 mg Intravenous 4 times per day  . mometasone-formoterol  2 puff Inhalation BID  . omega-3 acid ethyl esters  1 g Oral  Daily  . senna-docusate  1 tablet Oral QHS   Continuous Infusions: . 0.45 % NaCl with KCl 20 mEq / L 100 mL/hr at 01/19/16 0700   PRN Meds:.albuterol, benzonatate, diphenhydrAMINE **OR** diphenhydrAMINE, fluticasone, LORazepam, naloxone **AND** sodium chloride flush, ondansetron (ZOFRAN) IV, oxyCODONE, polyvinyl alcohol, potassium chloride, traMADol  General appearance: alert, cooperative and no distress Neurologic: intact Heart: regular rate and rhythm, S1, S2 normal, no murmur, click, rub or gallop Lungs: diminished breath sounds bibasilar Abdomen: soft, non-tender; bowel sounds normal; no masses,  no organomegaly Extremities: extremities normal, atraumatic, no cyanosis or edema and Homans sign is negative, no sign of DVT Wound: no air leak  Lab Results: CBC: Recent Labs  01/16/16 0933 01/18/16 1120 01/19/16 0415  WBC 7.8  --  7.7  HGB 12.5 10.2* 9.9*  HCT 39.4 30.0* 30.4*  PLT 440*  --  280   BMET:  Recent Labs  01/16/16 0933 01/18/16 1120 01/19/16 0415  NA 141 139 140  K 4.7 4.1 4.6  CL 108  --  105  CO2 22  --  27  GLUCOSE 113*  --  99  BUN 18  --  10  CREATININE 1.12*  --  0.96  CALCIUM 10.1  --  8.5*    PT/INR:  Recent Labs  01/16/16 0933  LABPROT 13.4  INR 1.00   Radiology: Dg Chest Port 1 View  01/19/2016  CLINICAL DATA:  Right lung surgery.  Chest tube. EXAM: PORTABLE CHEST 1 VIEW COMPARISON:  01/18/2016. FINDINGS: Right IJ line and 2 right chest tubes in stable position. Near complete resolution of right pneumothorax with tiny apical residual. Stable cardiomegaly. Postsurgical changes right lung. Lung volumes with basilar atelectasis. Interim improvement small left pleural effusion. Prior cervical spine fusion . IMPRESSION: 1. Right IJ line and 2 right chest tubes in stable position. Near complete resolution of right pneumothorax with tiny apical residual. Postsurgical changes right lung. 2. Low lung volumes with mild bibasilar atelectasis. Interim  improvement small left pleural effusion. 3. Stable cardiomegaly. Electronically Signed   By: Marcello Moores  Register   On: 01/19/2016 07:31   Dg Chest Port 1 View  01/18/2016  CLINICAL DATA:  Post RIGHT thoracotomy, RIGHT lung resection, and chest tube placement EXAM: PORTABLE CHEST 1 VIEW COMPARISON:  Portable exam 1424 hours compared to 01/16/2016 FINDINGS: RIGHT jugular central venous catheter with tip projecting over SVC. Pair of RIGHT thoracostomy tubes newly identified. Enlargement of cardiac silhouette with pulmonary vascular congestion. Question mild LEFT perihilar infiltrate versus edema. Minimal RIGHT basilar atelectasis without pleural effusion. Small RIGHT apex pneumothorax identified. LEFT basilar atelectasis and questionably small effusion present. Atherosclerotic calcification aorta. Prior cervical spine fusion. IMPRESSION: Postoperative changes RIGHT hemi thorax with small RIGHT apex pneumothorax despite presence of 2 thoracostomy tubes. Enlargement of cardiac silhouette with pulmonary vascular congestion and question minimal LEFT perihilar infiltrate versus edema. Electronically Signed   By: Lavonia Dana M.D.   On: 01/18/2016 14:37     Assessment/Plan: S/P Procedure(s) (LRB): VIDEO BRONCHOSCOPY (N/A) VIDEO ASSISTED THORACOSCOPY (VATS)/LUNG RESECTION, THOROCOTOMY, RIGHT UPPER LOBECTOMY, LYMPH NODE DISSECTION, PLACEMENT OF ON Q (Right) Mobilize Diuresis See progression orders History of renal failure now renal function stable Expected Acute  Blood - loss Anemia    Tammy Ball 01/19/2016 8:01 AM

## 2016-01-19 NOTE — Op Note (Signed)
Tammy Ball, Tammy Ball NO.:  [id number]  MEDICAL RECORD NO.:  [id number]  LOCATION:  2S15C                        FACILITY:  [facility]  PHYSICIAN:  Lanelle Bal, MD    DATE OF BIRTH:  May 18, 1941  DATE OF PROCEDURE:  01/18/2016 DATE OF DISCHARGE:                              OPERATIVE REPORT   PREOPERATIVE DIAGNOSIS:  Right upper lobe non-small cell carcinoma of the lung, status post chemotherapy treatment.  POSTOPERATIVE DIAGNOSIS:  Right upper lobe non-small cell carcinoma of the lung, status post chemotherapy treatment.  PROCEDURES:  Video bronchoscopy, right video-assisted thoracoscopy, mini thoracotomy, right upper lobectomy with lymph node dissection and placement of On-Q device.  SURGEON:  Lanelle Bal, MD.  ASSISTANT:  Providence Crosby, PA.  BRIEF HISTORY:  The patient is a 75 year old female with a previous history of cigarette use, who presented with pneumonia, sepsis, and renal insufficiency in the fall of 2016.  At that time, the patient was seen by the Pulmonary Service.  Bronchoscopy was performed.  There was a mass described growing from the right upper lobe bronchus.  Biopsies were performed confirming non-small cell carcinoma of the lung with a 4 cm mass centrally in the right upper lobe.  The patient underwent 3 rounds of chemotherapy.  A PET scan was performed that showed no evidence of metastatic disease.  A followup CT scan after 3 rounds of chemotherapy showed marked reduction in the size of the mass and after this, it was decided to refer the patient for consideration of surgical resection.  The patient underwent evaluation with pulmonary function studies, 6-minute walk test, Myoview stress test, and Cardiology clearance.  Risks and options of surgical resection were discussed with her in detail including her somewhat increased risk of surgery because of the age and functional status.  The patient agreed and signed informed  consent.  DESCRIPTION OF PROCEDURE:  With central line and arterial line in place, the patient underwent general endotracheal anesthesia without incident. Appropriate time-out was performed and we did a video bronchoscopy to the subsegmental level in both left and right tracheobronchial trees, paying particular attention to the right upper lobe bronchus. Photographs are in the media tab in EPIC.  Specifically the mass did not appear growing out of the right upper lobe bronchus , Tumour was noted at the trifurcation of the segmental bronchi looking into the right upper lobe.  We decided to proceed with resection.  The patient was turned in lateral decubitus position with the right side up.  Right chest was prepped with Betadine in usual sterile manner.  A second time-out was performed, and we proceeded with a small incision in the anterior axillary line approximately fourth intercostal space.  The video scope was introduced into the chest.  There was some adhesions to the apex that appeared benign, these were taken down with electrocautery unit mobilizing the right upper lobe.  Knowing the dissection around the bronchus would be somewhat difficult and tedious, we enlarged this incision and used a small retractor, and with the video scope and an additional port lower down, we then proceeded with the dissection.  The right upper lobe pulmonary veins were easily mobilized and divided  with a vascular stapler, this gave good visualization of the truncus branch of the right pulmonary artery to the upper lobe.  Two smaller branches were divided further of the pulmonary artery to the right upper lobe were divided and a third major trunk branch to the upper lobe was divided with a vascular stapler.  We then dissected around the bronchus.  Using a gold stapler the minor and major fissures were also completed preserving the arterial branches to the middle and lower lobes.  The right upper lobe  bronchus was then encircled with an umbilical tape.  To maximize our margin of the bronchus, a TA stapler was placed right at the takeoff of the right upper lobe bronchus and the bronchus sharply divided distally.  Specimen was placed in a specimen bag and brought out through the incision. Additional lymph nodes were dissected out 2R, 4R, 10R, 11R, 12R sites and submitted separately to the Pathology.  additional small cuff of bronchial stump was excised and submitted to Pathology as the final bronchial margin.  The bronchial margin on the specimen itself was still microscopically positive.  The second final bronchial margin was reported as negative on frozen section.  We then placed an On-Q tunneled catheter along posteriorly.  The bronchial stump was tested and was without air leak.We did mobilize the inferior pulmonary ligament to allow better expansion.  The middle lobe was tacked to the lower lobe to prevent torsion.   Two 28 chest tubes were left in place, standard tube anteriorly and a Blake drain posteriorly.  The lower ribs were drilled for pericostal suture, two pericostal sutures were placed.  The chest wall layers were then closed with interrupted 0 Vicryl, running 2-0 Vicryl in subcutaneous tissue, and a 4-0 subcuticular stitch in skin edges.  Dermabond was applied.  The lung reinflated nicely.  The patient was extubated in the operating room and transferred to recovery room for further postoperative care.  She tolerated the procedure without obvious complication.  Estimated blood loss was 200 mL.      Lanelle Bal, MD     EG/MEDQ  D:  01/19/2016  T:  01/19/2016  Job:  [id number]

## 2016-01-19 NOTE — Progress Notes (Signed)
UR Completed. Dmauri Rosenow, RN, BSN.  [phone] 

## 2016-01-19 NOTE — Progress Notes (Signed)
      [address]       [name] [address]             [phone]       POD # 1 RUL  Up in chair, just got back from a walk  BP 124/62 mmHg  Pulse 74  Temp(Src) 98.9 F (37.2 C) (Oral)  Resp 13  Ht '5\' 4"'$  (1.626 m)  Wt 178 lb 1.6 oz (80.786 kg)  BMI 30.56 kg/m2  SpO2 99%   Intake/Output Summary (Last 24 hours) at 01/19/16 1802 Last data filed at 01/19/16 1600  Gross per 24 hour  Intake   1620 ml  Output   2840 ml  Net  -1220 ml    Pain well controlled  Remo Lipps C. Roxan Hockey, MD [facility] [phone]

## 2016-01-20 ENCOUNTER — Inpatient Hospital Stay (HOSPITAL_COMMUNITY): Payer: Medicare Other

## 2016-01-20 LAB — CBC
HCT: 32.3 % — ABNORMAL LOW (ref 36.0–46.0)
Hemoglobin: 10 g/dL — ABNORMAL LOW (ref 12.0–15.0)
MCH: 32.2 pg (ref 26.0–34.0)
MCHC: 31 g/dL (ref 30.0–36.0)
MCV: 103.9 fL — ABNORMAL HIGH (ref 78.0–100.0)
Platelets: 293 10*3/uL (ref 150–400)
RBC: 3.11 MIL/uL — ABNORMAL LOW (ref 3.87–5.11)
RDW: 17.6 % — ABNORMAL HIGH (ref 11.5–15.5)
WBC: 11.4 10*3/uL — ABNORMAL HIGH (ref 4.0–10.5)

## 2016-01-20 LAB — COMPREHENSIVE METABOLIC PANEL
ALT: 15 U/L (ref 14–54)
AST: 22 U/L (ref 15–41)
Albumin: 2.9 g/dL — ABNORMAL LOW (ref 3.5–5.0)
Alkaline Phosphatase: 61 U/L (ref 38–126)
Anion gap: 8 (ref 5–15)
BUN: 7 mg/dL (ref 6–20)
CO2: 30 mmol/L (ref 22–32)
Calcium: 9.1 mg/dL (ref 8.9–10.3)
Chloride: 103 mmol/L (ref 101–111)
Creatinine, Ser: 0.92 mg/dL (ref 0.44–1.00)
GFR calc Af Amer: >60 mL/min (ref >60)
GFR calc non Af Amer: >60 mL/min (ref >60)
Glucose, Bld: 120 mg/dL — ABNORMAL HIGH (ref 65–99)
Potassium: 4.6 mmol/L (ref 3.5–5.1)
Sodium: 141 mmol/L (ref 135–145)
Total Bilirubin: 0.3 mg/dL (ref 0.3–1.2)
Total Protein: 5.4 g/dL — ABNORMAL LOW (ref 6.5–8.1)

## 2016-01-20 MED ORDER — ACETYLCYSTEINE 20 % IN SOLN
4.0000 mL | Freq: Three times a day (TID) | RESPIRATORY_TRACT | Status: AC
  Administered 2016-01-20 – 2016-01-21 (×4): 4 mL via RESPIRATORY_TRACT
  Filled 2016-01-20 (×3): qty 4

## 2016-01-20 MED ORDER — ACETYLCYSTEINE 10 % IN SOLN
2.0000 mL | Freq: Three times a day (TID) | RESPIRATORY_TRACT | Status: DC
  Filled 2016-01-20 (×2): qty 2

## 2016-01-20 NOTE — Care Management Important Message (Signed)
Important Message  Patient Details  Name: Tammy Ball MRN: [id number] Date of Birth: 09/03/1941   Medicare Important Message Given:  Yes    Nathen May 01/20/2016, 11:50 AM

## 2016-01-20 NOTE — Progress Notes (Signed)
Patient ID: DONIA YOKUM, female   DOB: 09-Oct-1941, 75 y.o.   MRN: [id number] EVENING ROUNDS NOTE :     [address]       ,[place] [address]             [phone]                 2 Days Post-Op Procedure(s) (LRB): VIDEO BRONCHOSCOPY (N/A) VIDEO ASSISTED THORACOSCOPY (VATS)/LUNG RESECTION, THOROCOTOMY, RIGHT UPPER LOBECTOMY, LYMPH NODE DISSECTION, PLACEMENT OF ON Q (Right)  Total Length of Stay:  LOS: 2 days  BP 103/44 mmHg  Pulse 82  Temp(Src) 98.8 F (37.1 C) (Oral)  Resp 15  Ht '5\' 4"'$  (1.626 m)  Wt 178 lb 1.6 oz (80.786 kg)  BMI 30.56 kg/m2  SpO2 96%  .Intake/Output      03/10 0701 - 03/11 0700   P.O. 120   I.V. (mL/kg) 130 (1.6)   IV Piggyback    Total Intake(mL/kg) 250 (3.1)   Urine (mL/kg/hr) 450 (0.4)   Stool 0 (0)   Chest Tube 170 (0.2)   Total Output 620   Net -370       Urine Occurrence 1 x   Stool Occurrence 1 x     . 0.45 % NaCl with KCl 20 mEq / L 10 mL/hr at 01/19/16 1051     Lab Results  Component Value Date   WBC 11.4* 01/20/2016   HGB 10.0* 01/20/2016   HCT 32.3* 01/20/2016   PLT 293 01/20/2016   GLUCOSE 120* 01/20/2016   CHOL 174 12/10/2015   TRIG 150* 12/10/2015   HDL 89 12/10/2015   LDLCALC 55 12/10/2015   ALT 15 01/20/2016   AST 22 01/20/2016   NA 141 01/20/2016   K 4.6 01/20/2016   CL 103 01/20/2016   CREATININE 0.92 01/20/2016   BUN 7 01/20/2016   CO2 30 01/20/2016   TSH 1.583 12/14/2011   INR 1.00 01/16/2016   Stable day No air leak  [name] MD  Beeper [phone] Office [phone] 01/20/2016 8:41 PM

## 2016-01-20 NOTE — Progress Notes (Signed)
Patient ID: Tammy Ball, female   DOB: [birth year], 75 y.o.   MRN: [id number] TCTS DAILY ICU PROGRESS NOTE                   [address]            [place] [address]          [phone]   2 Days Post-Op Procedure(s) (LRB): VIDEO BRONCHOSCOPY (N/A) VIDEO ASSISTED THORACOSCOPY (VATS)/LUNG RESECTION, THOROCOTOMY, RIGHT UPPER LOBECTOMY, LYMPH NODE DISSECTION, PLACEMENT OF ON Q (Right)  Total Length of Stay:  LOS: 2 days   Subjective: Nausea last pm and cough   Objective: Vital signs in last 24 hours: Temp:  [98.3 F (36.8 C)-98.9 F (37.2 C)] 98.5 F (36.9 C) (03/10 0409) Pulse Rate:  [70-113] 87 (03/10 0600) Cardiac Rhythm:  [-] Normal sinus rhythm (03/10 0400) Resp:  [12-25] 12 (03/10 0600) BP: (98-155)/(51-81) 98/81 mmHg (03/10 0600) SpO2:  [90 %-99 %] 95 % (03/10 0600) Arterial Line BP: (70)/(58) 70/58 mmHg (03/09 0800)  Filed Weights   01/18/16 0639 01/18/16 2300  Weight: 178 lb 1.6 oz (80.786 kg) 178 lb 1.6 oz (80.786 kg)    Weight change:    Hemodynamic parameters for last 24 hours:    Intake/Output from previous day: 03/09 0701 - 03/10 0700 In: 636.5 [I.V.:586.5; IV Piggyback:50] Out: 2290 [Urine:2000; Chest Tube:290]  Intake/Output this shift:    Current Meds: Scheduled Meds: . acetaminophen  1,000 mg Oral 4 times per day   Or  . acetaminophen (TYLENOL) oral liquid 160 mg/5 mL  1,000 mg Oral 4 times per day  . aspirin EC  81 mg Oral Daily  . atorvastatin  40 mg Oral Daily  . bisacodyl  10 mg Oral Daily  . enoxaparin (LOVENOX) injection  30 mg Subcutaneous Q24H  . fentaNYL   Intravenous 6 times per day  . irbesartan  75 mg Oral Daily  . levalbuterol  0.63 mg Nebulization Q6H  . mometasone-formoterol  2 puff Inhalation BID  . omega-3 acid ethyl esters  1 g Oral Daily  . senna-docusate  1 tablet Oral QHS   Continuous Infusions: . 0.45 % NaCl with KCl 20 mEq / L 10 mL/hr at 01/19/16 1051   PRN Meds:.albuterol, benzonatate,  diphenhydrAMINE **OR** diphenhydrAMINE, fluticasone, LORazepam, naloxone **AND** sodium chloride flush, ondansetron (ZOFRAN) IV, oxyCODONE, polyvinyl alcohol, potassium chloride, traMADol  General appearance: alert Neurologic: intact Heart: regular rate and rhythm, S1, S2 normal, no murmur, click, rub or gallop Lungs: diminished breath sounds bibasilar Abdomen: soft, non-tender; bowel sounds normal; no masses,  no organomegaly Extremities: extremities normal, atraumatic, no cyanosis or edema and Homans sign is negative, no sign of DVT Wound: no air leak from tubes  Lab Results: CBC: Recent Labs  01/19/16 0415 01/20/16 0425  WBC 7.7 11.4*  HGB 9.9* 10.0*  HCT 30.4* 32.3*  PLT 280 293   BMET:  Recent Labs  01/19/16 0415 01/20/16 0425  NA 140 141  K 4.6 4.6  CL 105 103  CO2 27 30  GLUCOSE 99 120*  BUN 10 7  CREATININE 0.96 0.92  CALCIUM 8.5* 9.1    PT/INR: No results for input(s): LABPROT, INR in the last 72 hours. Radiology: Dg Chest Port 1 View  01/20/2016  CLINICAL DATA:  Chest tube in place.  Cough and congestion today. EXAM: PORTABLE CHEST 1 VIEW COMPARISON:  01/19/2016 FINDINGS: Two right chest tubes and right central venous catheter remain unchanged in position. Tiny  residual right apical pneumothorax similar previous study. No developing consolidation or atelectasis in the lungs. No blunting of costophrenic angles. No pneumothorax. Heart size and pulmonary vascularity are normal. Calcification of the aorta. Postoperative changes in the cervical spine. IMPRESSION: No change since prior study. Appliances remain unchanged in position. Small residual right apical pneumothorax similar to prior study. Electronically Signed   By: Lucienne Capers M.D.   On: 01/20/2016 02:00     Assessment/Plan: S/P Procedure(s) (LRB): VIDEO BRONCHOSCOPY (N/A) VIDEO ASSISTED THORACOSCOPY (VATS)/LUNG RESECTION, THOROCOTOMY, RIGHT UPPER LOBECTOMY, LYMPH NODE DISSECTION, PLACEMENT OF ON Q  (Right) Mobilize Diuresis Some congestion add mucomyst for secretions  D/c anterior chest tube   [name] 01/20/2016 7:05 AM

## 2016-01-21 ENCOUNTER — Inpatient Hospital Stay (HOSPITAL_COMMUNITY): Payer: Medicare Other

## 2016-01-21 LAB — CBC
HCT: 31.3 % — ABNORMAL LOW (ref 36.0–46.0)
Hemoglobin: 9.6 g/dL — ABNORMAL LOW (ref 12.0–15.0)
MCH: 31.9 pg (ref 26.0–34.0)
MCHC: 30.7 g/dL (ref 30.0–36.0)
MCV: 104 fL — ABNORMAL HIGH (ref 78.0–100.0)
Platelets: 273 10*3/uL (ref 150–400)
RBC: 3.01 MIL/uL — ABNORMAL LOW (ref 3.87–5.11)
RDW: 17.2 % — ABNORMAL HIGH (ref 11.5–15.5)
WBC: 9.7 10*3/uL (ref 4.0–10.5)

## 2016-01-21 LAB — BASIC METABOLIC PANEL
Anion gap: 7 (ref 5–15)
BUN: 9 mg/dL (ref 6–20)
CO2: 33 mmol/L — ABNORMAL HIGH (ref 22–32)
Calcium: 9 mg/dL (ref 8.9–10.3)
Chloride: 101 mmol/L (ref 101–111)
Creatinine, Ser: 0.96 mg/dL (ref 0.44–1.00)
GFR calc Af Amer: >60 mL/min (ref >60)
GFR calc non Af Amer: 58 mL/min — ABNORMAL LOW (ref >60)
Glucose, Bld: 108 mg/dL — ABNORMAL HIGH (ref 65–99)
Potassium: 4.5 mmol/L (ref 3.5–5.1)
Sodium: 141 mmol/L (ref 135–145)

## 2016-01-21 MED ORDER — ALBUMIN HUMAN 5 % IV SOLN
INTRAVENOUS | Status: AC
  Filled 2016-01-21: qty 250

## 2016-01-21 MED ORDER — ALBUMIN HUMAN 5 % IV SOLN
12.5000 g | Freq: Once | INTRAVENOUS | Status: AC
  Administered 2016-01-21: 12.5 g via INTRAVENOUS

## 2016-01-21 NOTE — Progress Notes (Signed)
Patient ID: Tammy Ball, female   DOB: 10-30-1941, 75 y.o.   MRN: [id number] TCTS DAILY ICU PROGRESS NOTE                   [address]            [place] [address]          [phone]   3 Days Post-Op Procedure(s) (LRB): VIDEO BRONCHOSCOPY (N/A) VIDEO ASSISTED THORACOSCOPY (VATS)/LUNG RESECTION, THOROCOTOMY, RIGHT UPPER LOBECTOMY, LYMPH NODE DISSECTION, PLACEMENT OF ON Q (Right)  Total Length of Stay:  LOS: 3 days   Subjective: Did not sleep well  Objective: Vital signs in last 24 hours: Temp:  [98.8 F (37.1 C)-99.1 F (37.3 C)] 98.8 F (37.1 C) (03/10 2000) Pulse Rate:  [70-107] 87 (03/11 0700) Cardiac Rhythm:  [-] Normal sinus rhythm (03/11 0400) Resp:  [13-26] 16 (03/11 0700) BP: (89-139)/(44-87) 104/55 mmHg (03/11 0700) SpO2:  [87 %-99 %] 98 % (03/11 0700)  Filed Weights   01/18/16 0639 01/18/16 2300  Weight: 178 lb 1.6 oz (80.786 kg) 178 lb 1.6 oz (80.786 kg)    Weight change:    Hemodynamic parameters for last 24 hours:    Intake/Output from previous day: 03/10 0701 - 03/11 0700 In: 350 [P.O.:120; I.V.:230] Out: 920 [Urine:650; Chest Tube:270]  Intake/Output this shift:    Current Meds: Scheduled Meds: . acetaminophen  1,000 mg Oral 4 times per day   Or  . acetaminophen (TYLENOL) oral liquid 160 mg/5 mL  1,000 mg Oral 4 times per day  . acetylcysteine  4 mL Nebulization TID  . aspirin EC  81 mg Oral Daily  . atorvastatin  40 mg Oral Daily  . bisacodyl  10 mg Oral Daily  . enoxaparin (LOVENOX) injection  30 mg Subcutaneous Q24H  . fentaNYL   Intravenous 6 times per day  . irbesartan  75 mg Oral Daily  . levalbuterol  0.63 mg Nebulization Q6H  . mometasone-formoterol  2 puff Inhalation BID  . omega-3 acid ethyl esters  1 g Oral Daily  . senna-docusate  1 tablet Oral QHS   Continuous Infusions: . 0.45 % NaCl with KCl 20 mEq / L 10 mL/hr at 01/19/16 1051   PRN Meds:.albuterol, benzonatate, diphenhydrAMINE **OR**  diphenhydrAMINE, fluticasone, LORazepam, naloxone **AND** sodium chloride flush, ondansetron (ZOFRAN) IV, oxyCODONE, polyvinyl alcohol, potassium chloride, traMADol  General appearance: alert, cooperative and no distress Neurologic: intact Heart: regular rate and rhythm, S1, S2 normal, no murmur, click, rub or gallop Lungs: diminished breath sounds bibasilar Abdomen: soft, non-tender; bowel sounds normal; no masses,  no organomegaly Extremities: extremities normal, atraumatic, no cyanosis or edema and Homans sign is negative, no sign of DVT Wound: one small bubble  Lab Results: CBC: Recent Labs  01/20/16 0425 01/21/16 0425  WBC 11.4* 9.7  HGB 10.0* 9.6*  HCT 32.3* 31.3*  PLT 293 273   BMET:  Recent Labs  01/20/16 0425 01/21/16 0425  NA 141 141  K 4.6 4.5  CL 103 101  CO2 30 33*  GLUCOSE 120* 108*  BUN 7 9  CREATININE 0.92 0.96  CALCIUM 9.1 9.0    PT/INR: No results for input(s): LABPROT, INR in the last 72 hours. Radiology: No results found.   Assessment/Plan: S/P Procedure(s) (LRB): VIDEO BRONCHOSCOPY (N/A) VIDEO ASSISTED THORACOSCOPY (VATS)/LUNG RESECTION, THOROCOTOMY, RIGHT UPPER LOBECTOMY, LYMPH NODE DISSECTION, PLACEMENT OF ON Q (Right) Mobilize Diuresis Plan for transfer to step-down: see transfer orders Leave chest tube one more  day D/c pca pump    [name] 01/21/2016 7:29 AM

## 2016-01-21 NOTE — Progress Notes (Signed)
Patient ID: Tammy Ball, female   DOB: 1941-03-04, 75 y.o.   MRN: [id number] EVENING ROUNDS NOTE :     [address]       [place] [address]             [phone]                 3 Days Post-Op Procedure(s) (LRB): VIDEO BRONCHOSCOPY (N/A) VIDEO ASSISTED THORACOSCOPY (VATS)/LUNG RESECTION, THOROCOTOMY, RIGHT UPPER LOBECTOMY, LYMPH NODE DISSECTION, PLACEMENT OF ON Q (Right)  Total Length of Stay:  LOS: 3 days  BP 116/67 mmHg  Pulse 93  Temp(Src) 98.3 F (36.8 C) (Oral)  Resp 28  Ht '5\' 4"'$  (1.626 m)  Wt 178 lb 1.6 oz (80.786 kg)  BMI 30.56 kg/m2  SpO2 91%  .Intake/Output      03/10 0701 - 03/11 0700 03/11 0701 - 03/12 0700   P.O. 120 500   I.V. (mL/kg) 240 (3) 148.5 (1.8)   IV Piggyback  250   Total Intake(mL/kg) 360 (4.5) 898.5 (11.1)   Urine (mL/kg/hr) 650 (0.3) 800 (0.8)   Stool 0 (0)    Chest Tube 270 (0.1) 50 (0.1)   Total Output 920 850   Net -560 +48.5        Urine Occurrence 4 x    Stool Occurrence 4 x      . 0.45 % NaCl with KCl 20 mEq / L 10 mL/hr at 01/19/16 1051     Lab Results  Component Value Date   WBC 9.7 01/21/2016   HGB 9.6* 01/21/2016   HCT 31.3* 01/21/2016   PLT 273 01/21/2016   GLUCOSE 108* 01/21/2016   CHOL 174 12/10/2015   TRIG 150* 12/10/2015   HDL 89 12/10/2015   LDLCALC 55 12/10/2015   ALT 15 01/20/2016   AST 22 01/20/2016   NA 141 01/21/2016   K 4.5 01/21/2016   CL 101 01/21/2016   CREATININE 0.96 01/21/2016   BUN 9 01/21/2016   CO2 33* 01/21/2016   TSH 1.583 12/14/2011   INR 1.00 01/16/2016   bp 80-90, up in chair feels well, will give fluid bolus , transition to ultram in case pain meds influencing bp  [name] MD  Beeper [phone] Office [phone] 01/21/2016 6:43 PM

## 2016-01-22 ENCOUNTER — Inpatient Hospital Stay (HOSPITAL_COMMUNITY): Payer: Medicare Other

## 2016-01-22 LAB — BASIC METABOLIC PANEL
Anion gap: 9 (ref 5–15)
BUN: 15 mg/dL (ref 6–20)
CO2: 31 mmol/L (ref 22–32)
Calcium: 8.8 mg/dL — ABNORMAL LOW (ref 8.9–10.3)
Chloride: 100 mmol/L — ABNORMAL LOW (ref 101–111)
Creatinine, Ser: 1.1 mg/dL — ABNORMAL HIGH (ref 0.44–1.00)
GFR calc Af Amer: 57 mL/min — ABNORMAL LOW (ref >60)
GFR calc non Af Amer: 49 mL/min — ABNORMAL LOW (ref >60)
Glucose, Bld: 106 mg/dL — ABNORMAL HIGH (ref 65–99)
Potassium: 4.6 mmol/L (ref 3.5–5.1)
Sodium: 140 mmol/L (ref 135–145)

## 2016-01-22 LAB — CBC
HCT: 30 % — ABNORMAL LOW (ref 36.0–46.0)
Hemoglobin: 9.2 g/dL — ABNORMAL LOW (ref 12.0–15.0)
MCH: 31.8 pg (ref 26.0–34.0)
MCHC: 30.7 g/dL (ref 30.0–36.0)
MCV: 103.8 fL — ABNORMAL HIGH (ref 78.0–100.0)
Platelets: 256 10*3/uL (ref 150–400)
RBC: 2.89 MIL/uL — ABNORMAL LOW (ref 3.87–5.11)
RDW: 17 % — ABNORMAL HIGH (ref 11.5–15.5)
WBC: 7.6 10*3/uL (ref 4.0–10.5)

## 2016-01-22 MED ORDER — LEVALBUTEROL HCL 0.63 MG/3ML IN NEBU
0.6300 mg | INHALATION_SOLUTION | Freq: Three times a day (TID) | RESPIRATORY_TRACT | Status: DC
  Administered 2016-01-22 – 2016-01-24 (×5): 0.63 mg via RESPIRATORY_TRACT
  Filled 2016-01-22 (×5): qty 3

## 2016-01-22 MED ORDER — TRAMADOL HCL 50 MG PO TABS
50.0000 mg | ORAL_TABLET | Freq: Once | ORAL | Status: AC
  Administered 2016-01-22: 50 mg via ORAL

## 2016-01-22 NOTE — Progress Notes (Signed)
Pt arrived on the unit via wheelchair from 2S. Placed on cardiac monitor ccmd notified, skin intact , assessment competed see flow sheet, call light within reach, will continue to monitor

## 2016-01-22 NOTE — Progress Notes (Signed)
Patient ID: Tammy Ball, female   DOB: Sep 18, 1941, 75 y.o.   MRN: [id number] TCTS DAILY ICU PROGRESS NOTE                   [address]            [place] [address]          [phone]   4 Days Post-Op Procedure(s) (LRB): VIDEO BRONCHOSCOPY (N/A) VIDEO ASSISTED THORACOSCOPY (VATS)/LUNG RESECTION, THOROCOTOMY, RIGHT UPPER LOBECTOMY, LYMPH NODE DISSECTION, PLACEMENT OF ON Q (Right)  Total Length of Stay:  LOS: 4 days   Subjective: Waiting for step down bed or 2-3 days  Objective: Vital signs in last 24 hours: Temp:  [97.5 F (36.4 C)-98.6 F (37 C)] 97.8 F (36.6 C) (03/12 0914) Pulse Rate:  [67-98] 84 (03/12 1000) Cardiac Rhythm:  [-] Normal sinus rhythm (03/12 1000) Resp:  [13-28] 13 (03/12 1000) BP: (90-124)/(38-89) 107/62 mmHg (03/12 0900) SpO2:  [91 %-100 %] 100 % (03/12 1000) Weight:  [181 lb (82.1 kg)] 181 lb (82.1 kg) (03/12 0500)  Filed Weights   01/18/16 0639 01/18/16 2300 01/22/16 0500  Weight: 178 lb 1.6 oz (80.786 kg) 178 lb 1.6 oz (80.786 kg) 181 lb (82.1 kg)    Weight change:    Hemodynamic parameters for last 24 hours:    Intake/Output from previous day: 03/11 0701 - 03/12 0700 In: 1828.5 [P.O.:1300; I.V.:268.5; IV Piggyback:250] Out: 1860 [Urine:1750; Chest Tube:110]  Intake/Output this shift: Total I/O In: 10 [I.V.:10] Out: -   Current Meds: Scheduled Meds: . acetaminophen  1,000 mg Oral 4 times per day   Or  . acetaminophen (TYLENOL) oral liquid 160 mg/5 mL  1,000 mg Oral 4 times per day  . aspirin EC  81 mg Oral Daily  . atorvastatin  40 mg Oral Daily  . bisacodyl  10 mg Oral Daily  . enoxaparin (LOVENOX) injection  30 mg Subcutaneous Q24H  . irbesartan  75 mg Oral Daily  . levalbuterol  0.63 mg Nebulization Q6H  . mometasone-formoterol  2 puff Inhalation BID  . omega-3 acid ethyl esters  1 g Oral Daily  . senna-docusate  1 tablet Oral QHS   Continuous Infusions: . 0.45 % NaCl with KCl 20 mEq / L 10 mL/hr at  01/21/16 2121   PRN Meds:.albuterol, benzonatate, fluticasone, LORazepam, ondansetron (ZOFRAN) IV, oxyCODONE, polyvinyl alcohol, potassium chloride, traMADol  General appearance: alert and cooperative Neurologic: intact Heart: regular rate and rhythm, S1, S2 normal, no murmur, click, rub or gallop Lungs: clear to auscultation bilaterally Abdomen: soft, non-tender; bowel sounds normal; no masses,  no organomegaly Extremities: extremities normal, atraumatic, no cyanosis or edema and Homans sign is negative, no sign of DVT Wound: no air leak  Lab Results: CBC: Recent Labs  01/21/16 0425 01/22/16 0330  WBC 9.7 7.6  HGB 9.6* 9.2*  HCT 31.3* 30.0*  PLT 273 256   BMET:  Recent Labs  01/21/16 0425 01/22/16 0330  NA 141 140  K 4.5 4.6  CL 101 100*  CO2 33* 31  GLUCOSE 108* 106*  BUN 9 15  CREATININE 0.96 1.10*  CALCIUM 9.0 8.8*    PT/INR: No results for input(s): LABPROT, INR in the last 72 hours. Radiology: Dg Chest Port 1 View  01/22/2016  CLINICAL DATA:  Chest tube EXAM: PORTABLE CHEST 1 VIEW COMPARISON:  01/21/2016 FINDINGS: Right apical chest tube. Associated small right apical pneumothorax, slightly increased. Mild subcutaneous emphysema. Mild patchy bilateral lower lobe opacities,  likely atelectasis. Right IJ venous catheter in the lower SVC. Cardiomegaly. Cervical spine fixation hardware. IMPRESSION: Right apical chest tube. Associated small right apical pneumothorax, slightly increased. Mild subcutaneous emphysema. Electronically Signed   By: Julian Hy M.D.   On: 01/22/2016 08:15     Assessment/Plan: S/P Procedure(s) (LRB): VIDEO BRONCHOSCOPY (N/A) VIDEO ASSISTED THORACOSCOPY (VATS)/LUNG RESECTION, THOROCOTOMY, RIGHT UPPER LOBECTOMY, LYMPH NODE DISSECTION, PLACEMENT OF ON Q (Right) Mobilize Plan for transfer to step-down: see transfer orders No air leak 2x days will d/c chest tube    [name] 01/22/2016 10:59 AM

## 2016-01-23 ENCOUNTER — Inpatient Hospital Stay (HOSPITAL_COMMUNITY): Payer: Medicare Other

## 2016-01-23 ENCOUNTER — Other Ambulatory Visit: Payer: Self-pay | Admitting: *Deleted

## 2016-01-23 LAB — BASIC METABOLIC PANEL
Anion gap: 6 (ref 5–15)
BUN: 20 mg/dL (ref 6–20)
CO2: 33 mmol/L — ABNORMAL HIGH (ref 22–32)
Calcium: 9.1 mg/dL (ref 8.9–10.3)
Chloride: 98 mmol/L — ABNORMAL LOW (ref 101–111)
Creatinine, Ser: 1.03 mg/dL — ABNORMAL HIGH (ref 0.44–1.00)
GFR calc Af Amer: >60 mL/min (ref >60)
GFR calc non Af Amer: 53 mL/min — ABNORMAL LOW (ref >60)
Glucose, Bld: 94 mg/dL (ref 65–99)
Potassium: 4.5 mmol/L (ref 3.5–5.1)
Sodium: 137 mmol/L (ref 135–145)

## 2016-01-23 LAB — CBC
HCT: 29.8 % — ABNORMAL LOW (ref 36.0–46.0)
Hemoglobin: 9.1 g/dL — ABNORMAL LOW (ref 12.0–15.0)
MCH: 31.9 pg (ref 26.0–34.0)
MCHC: 30.5 g/dL (ref 30.0–36.0)
MCV: 104.6 fL — ABNORMAL HIGH (ref 78.0–100.0)
Platelets: 262 10*3/uL (ref 150–400)
RBC: 2.85 MIL/uL — ABNORMAL LOW (ref 3.87–5.11)
RDW: 16.9 % — ABNORMAL HIGH (ref 11.5–15.5)
WBC: 6.8 10*3/uL (ref 4.0–10.5)

## 2016-01-23 MED ORDER — PHENOL 1.4 % MT LIQD
1.0000 | OROMUCOSAL | Status: DC | PRN
  Administered 2016-01-23: 1 via OROMUCOSAL
  Filled 2016-01-23: qty 177

## 2016-01-23 NOTE — Discharge Instructions (Signed)
Lung Cancer °Lung cancer occurs when abnormal cells in the lung grow out of control and form a mass (tumor). There are several types of lung cancer. The two most common types are: °· Non-small cell. In this type of lung cancer, abnormal cells are larger and grow more slowly than those of small cell lung cancer. °· Small cell. In this type of lung cancer, abnormal cells are smaller than those of non-small cell lung cancer. Small cell lung cancer gets worse faster than non-small cell lung cancer. °CAUSES  °The leading cause of lung cancer is smoking tobacco. The second leading cause is radon exposure. °RISK FACTORS °· Smoking tobacco. °· Exposure to secondhand tobacco smoke. °· Exposure to radon gas. °· Exposure to asbestos. °· Exposure to arsenic in drinking water. °· Air pollution. °· Family or personal history of lung cancer. °· Lung radiation therapy. °· Being older than 65 years. °SIGNS AND SYMPTOMS  °In the early stages, symptoms may not be present. As the cancer progresses, symptoms may include: °· A lasting cough, possibly with blood. °· Fatigue. °· Unexplained weight loss. °· Shortness of breath. °· Wheezing. °· Chest pain. °· Loss of appetite. °Symptoms of advanced lung cancer include: °· Hoarseness. °· Bone or joint pain. °· Weakness. °· Nail problems. °· Face or arm swelling. °· Paralysis of the face. °· Drooping eyelids. °DIAGNOSIS  °Lung cancer can be identified with a physical exam and with tests such as: °· A chest X-ray. °· A CT scan. °· Blood tests. °· A biopsy. °After a diagnosis is made, you will have more tests to determine the stage of the cancer. The stages of non-small cell lung cancer are: °· Stage 0, also called carcinoma in situ. At this stage, abnormal cells are found in the inner lining of your lung or lungs. °· Stage I. At this stage, abnormal cells have grown into a tumor that is no larger than 5 cm across. The cancer has entered the deeper lung tissue but has not yet entered the lymph  nodes or other parts of the body. °· Stage II. At this stage, the tumor is 7 cm across or smaller and has entered nearby lymph nodes. Or, the tumor is 5 cm across or smaller and has invaded surrounding tissue but is not found in nearby lymph nodes. There may be more than one tumor present. °· Stage III. At this stage, the tumor may be any size. There may be more than one tumor in the lungs. The cancer cells have spread to the lymph nodes and possibly to other organs. °· Stage IV. At this stage, there are tumors in both lungs and the cancer has spread to other areas of the body. °The stages of small cell lung cancer are: °· Limited. At this stage, the cancer is found only on one side of the chest. °· Extensive. At this stage, the cancer is in the lungs and in tissues on the other side of the chest. The cancer has spread to other organs or is found in the fluid between the layers of your lungs. °TREATMENT  °Depending on the type and stage of your lung cancer, you may be treated with: °· Surgery. This is done to remove a tumor. °· Radiation therapy. This treatment destroys cancer cells using X-rays or other types of radiation. °· Chemotherapy. This treatment uses medicines to destroy cancer cells. °· Targeted therapy. This treatment aims to destroy only cancer cells instead of all cells as other therapies do. °You may   also have a combination of treatments. HOME CARE INSTRUCTIONS   Do not use any tobacco products. This includes cigarettes, chewing tobacco, and electronic cigarettes. If you need help quitting, ask your health care provider.  Take medicines only as directed by your health care provider.  Eat a healthy diet. Work with a dietitian to make sure you are getting the nutrition you need.  Consider joining a support group or seeking counseling to help you cope with the stress of having lung cancer.  Let your cancer specialist (oncologist) know if you are admitted to the hospital.  Keep all follow-up  visits as directed by your health care provider. This is important. SEEK MEDICAL CARE IF:   You lose weight without trying.  You have a persistent cough and wheezing.  You feel short of breath.  You tire easily.  You experience bone or joint pain.  You have difficulty swallowing.  You feel hoarse or notice your voice changing.  Your pain medicine is not helping. SEEK IMMEDIATE MEDICAL CARE IF:   You cough up blood.  You have new breathing problems.  You develop chest pain.  You develop swelling in:  One or both ankles or legs.  Your face, neck, or arms.  You are confused.  You experience paralysis in your face or a drooping eyelid.   This information is not intended to replace advice given to you by your health care provider. Make sure you discuss any questions you have with your health care provider.   Document Released: 02/04/2001 Document Revised: 07/20/2015 Document Reviewed: 03/04/2014 Elsevier Interactive Patient Education 2016 [place] [facility], Care After  Refer to this sheet in the next few weeks. These instructions provide you with information on caring for yourself after your procedure. Your doctor may also give you more specific instructions. Your procedure has been planned according to current medical practices, but problems sometimes occur. Call your doctor if you have any problems or questions after your procedure. HOME CARE   Only take over-the-counter or prescription medicines as told by your doctor.  Only take pain medicines (narcotics) as told by your doctor.  Do not drive until your doctor says it is OK. Driving while taking pain medicines or soon after surgery can be dangerous.  Avoid activities that use your chest muscles for at least 3-4 weeks. These include lifting heavy objects.  Take deep breaths. This expands the lungs and protects against a lung infection (pneumonia).  Do breathing exercises as told by your  doctor. If you were given a device to help with breathing, use it as told by your doctor.  You may resume a normal diet and activities when you feel you are able to or as told by your doctor.  Do not take a bath until your doctor says it is OK. Use the shower instead.  Keep the bandage (dressing) covering the area where the chest tube was put (incision site) dry for 48 hours. Remove the bandage after 48 hours unless there is new fluid on the bandage.  Remove bandages as told by your doctor.  Change bandages if necessary or as told by your doctor.  Keep all follow-up doctor visits. It is important to see your doctor after surgery. You may need follow-up care and your condition may need to be watched. GET HELP RIGHT AWAY IF:  You have a fever.  You have chest pain.  You have a rash.  You have shortness of breath.  You have trouble breathing.  You feel weak, lightheaded, dizzy, or faint.  You feel a lot of pain near an area where a surgical cut was made.  The pain at an area where a surgical cut was made is getting worse.  You notice bleeding, fluid, or pus coming from where a surgical cut was made.  You notice irritation, puffiness (swelling), or redness near the surgical cut.  There is a bad smell coming from from a bandage or from an area where a surgical cut was made.  It feels like your heart is fluttering or beating rapidly.  Your pain medicine does not relieve your pain. MAKE SURE YOU:   Understand these instructions.  Will watch your condition.  Will get help right away if you are not doing well or get worse.   This information is not intended to replace advice given to you by your health care provider. Make sure you discuss any questions you have with your health care provider.   Document Released: 02/23/2013 Document Revised: 11/19/2014 Document Reviewed: 02/23/2013 Elsevier Interactive Patient Education [employer].

## 2016-01-23 NOTE — Care Management Note (Signed)
Case Management Note Marvetta Gibbons RN, BSN Unit 2W-Case Manager [phone]  Patient Details  Name: Tammy Ball MRN: [id number] Date of Birth: January 19, 1941  Subjective/Objective:   Pt admitted s/p lobectomy                 Action/Plan: PTA pt lived at home alone- per conversation with pt- has family nearby that can provide support but not 24/7 care- may benefit from PT eval- has been using RW here- does not have one at home- may need DME RW for home- may also need HH at discharge - also is still on 2L of 02- weaning- may need home 02- has used [facility] for home 02 in the past. CM to follow   Expected Discharge Date:                  Expected Discharge Plan:  [place]  In-House Referral:     Discharge planning Services  CM Consult  Post Acute Care Choice:    Choice offered to:  Patient  DME Arranged:    DME Agency:     HH Arranged:    [place] Agency:     Status of Service:  In process, will continue to follow  Medicare Important Message Given:  Yes Date Medicare IM Given:    Medicare IM give by:    Date Additional Medicare IM Given:    Additional Medicare Important Message give by:     If discussed at [place] of Stay Meetings, dates discussed:    Additional Comments:  Dawayne Patricia, RN 01/23/2016, 3:09 PM

## 2016-01-23 NOTE — Evaluation (Signed)
Physical Therapy Evaluation Patient Details Name: Tammy Ball MRN: [id number] DOB: [birth year] Today's Date: 01/23/2016   History of Present Illness  Patient is a 75 y/o female with hx of COPD, HTN, depression, glaucoma, HLD, lung ca, renal cell carcinoma, anxiety s/p VATs and lobectomy.   Clinical Impression  Patient presents with decreased endurance, generalized weakness and balance deficits impacting safe mobility. Requires supplemental 02 during mobility, with the attempt of weaning. Pt lives alone and was independent and working PTA. Has family support in the evenings and on weekends to help with IADLs and driving. Needs to perform stair training next session prior to d/c. Fatigues with mobility. Will follow acutely to maximize independence and mobility prior to return home.     Follow Up Recommendations Home health PT;Supervision - Intermittent    Equipment Recommendations  Rolling walker with 5" wheels    Recommendations for Other Services       Precautions / Restrictions Precautions Precautions: Fall Precaution Comments: monitor 02 Restrictions Weight Bearing Restrictions: No      Mobility  Bed Mobility Overal bed mobility: Needs Assistance Bed Mobility: Rolling;Sidelying to Sit Rolling: Modified independent (Device/Increase time) Sidelying to sit: Modified independent (Device/Increase time);HOB elevated       General bed mobility comments: Use of rail, no physical assist needed.  Transfers Overall transfer level: Needs assistance Equipment used: Rolling walker (2 wheeled) Transfers: Sit to/from Stand Sit to Stand: Supervision         General transfer comment: Supervision for safety.   Ambulation/Gait Ambulation/Gait assistance: Supervision Ambulation Distance (Feet): 200 Feet Assistive device: Rolling walker (2 wheeled) Gait Pattern/deviations: Step-through pattern;Decreased stride length;Trunk flexed Gait velocity: decreased   General Gait Details:  Slow, mildly unsteady gait. Mild 1/4 DOE. Ambulated on 2L/min 02.   Stairs            Wheelchair Mobility    Modified Rankin (Stroke Patients Only)       Balance Overall balance assessment: Needs assistance Sitting-balance support: Feet supported;No upper extremity supported Sitting balance-Leahy Scale: Good     Standing balance support: During functional activity Standing balance-Leahy Scale: Fair                               Pertinent Vitals/Pain Pain Assessment: Faces Faces Pain Scale: Hurts little more Pain Location: right side Pain Descriptors / Indicators: Sore Pain Intervention(s): Monitored during session;Repositioned    Home Living Family/patient expects to be discharged to:: Private residence Living Arrangements: Alone Available Help at Discharge: Family;Available PRN/intermittently (children work) Type of Home: House Home Access: Stairs to enter Entrance Stairs-Rails: None Entrance Stairs-Number of Steps: 2 Home Layout: Two level;Able to live on main level with bedroom/bathroom Home Equipment: None      Prior Function Level of Independence: Independent         Comments: Works as substance abuse counselor     Journalist, newspaper        Extremity/Trunk Assessment   Upper Extremity Assessment: Defer to OT evaluation           Lower Extremity Assessment: Generalized weakness         Communication   Communication: No difficulties  Cognition Arousal/Alertness: Awake/alert Behavior During Therapy: WFL for tasks assessed/performed Overall Cognitive Status: Within Functional Limits for tasks assessed       Memory: Decreased short-term memory              General Comments General comments (skin  integrity, edema, etc.): Discussed and demonstrated stair negotiation technique going backwards with RW. Discussed process of weaning 22. Sp02 88-93% on RA at rest.     Exercises        Assessment/Plan    PT Assessment  Patient needs continued PT services  PT Diagnosis Generalized weakness   PT Problem List Decreased strength;Cardiopulmonary status limiting activity;Pain;Decreased activity tolerance;Decreased balance;Decreased mobility;Decreased knowledge of use of DME  PT Treatment Interventions Balance training;Gait training;Functional mobility training;Therapeutic activities;Therapeutic exercise;Patient/family education;Stair training;DME instruction   PT Goals (Current goals can be found in the Care Plan section) Acute Rehab PT Goals Patient Stated Goal: to return to PLOF PT Goal Formulation: With patient Time For Goal Achievement: 02/06/16 Potential to Achieve Goals: Good    Frequency Min 3X/week   Barriers to discharge Decreased caregiver support LIves alone    Co-evaluation               End of Session Equipment Utilized During Treatment: Oxygen;Gait belt Activity Tolerance: Patient tolerated treatment well Patient left: in bed;with call bell/phone within reach Nurse Communication: Mobility status;Other (comment) (weaning 02)         Time: 4481-8563 PT Time Calculation (min) (ACUTE ONLY): 24 min   Charges:   PT Evaluation $PT Eval Moderate Complexity: 1 Procedure PT Treatments $Gait Training: 8-22 mins   PT G Codes:        Lucero Auzenne A Axie Hayne 01/23/2016, 4:34 PM Wray Kearns, [place], DPT [phone]

## 2016-01-23 NOTE — Consult Note (Signed)
   [facility] CM Inpatient Consult   01/23/2016  Tammy Ball 11/18/1940 [id number]   Patient screened for [name] Management services. Went to bedside to speak with patient about [place] Management program. Written consent obtained. She endorses she lives alone with family support however. She has multiple co-morbidities including lung cancer, COPD, and HTN according EPIC notes. Patient endorses that she would appreciate the post hospital follow up. Explained to [name] that she will receive post hospital transition of care calls and will be evaluated for monthly home visits. Discussed that [place] Management will not interfere or replace any services provided by home health if she should have it. [facility] Care Management packet and contact information left at bedside. Appreciative of visit. Made inpatient RNCM aware [place] Management will follow post discharge.  Confirmed Primary Care MD is Dr. Joseph Art. Best contact number for patient is [phone]. Patient declines issues with affording medications. She states her daughter and son work. She says her son is able to take her to her MD appointments on the days he is off most of the time.  Marthenia Rolling, MSN-Ed, RN,BSN [facility] Liaison [phone]

## 2016-01-23 NOTE — Progress Notes (Addendum)
[address]       [employer] [id number]             [phone]      5 Days Post-Op Procedure(s) (LRB): VIDEO BRONCHOSCOPY (N/A) VIDEO ASSISTED THORACOSCOPY (VATS)/LUNG RESECTION, THOROCOTOMY, RIGHT UPPER LOBECTOMY, LYMPH NODE DISSECTION, PLACEMENT OF ON Q (Right) Subjective: conts to feel better, moderate sputum production, O2 at 2 l [place]  Objective: Vital signs in last 24 hours: Temp:  [97.7 F (36.5 C)-98.1 F (36.7 C)] 98 F (36.7 C) (03/13 0322) Pulse Rate:  [81-89] 85 (03/13 0322) Cardiac Rhythm:  [-] Normal sinus rhythm (03/12 2000) Resp:  [13-33] 16 (03/13 0322) BP: (99-146)/(59-72) 129/70 mmHg (03/13 0322) SpO2:  [93 %-100 %] 99 % (03/13 0724)  Hemodynamic parameters for last 24 hours:    Intake/Output from previous day: 03/12 0701 - 03/13 0700 In: 150 [P.O.:100; I.V.:50] Out: 1050 [Urine:1050] Intake/Output this shift:    General appearance: alert, cooperative and no distress Heart: regular rate and rhythm Lungs: clear to auscultation bilaterally Abdomen: benign Extremities: no edema or calf tenderness Wound: incis healing well  Lab Results:  Recent Labs  01/22/16 0330 01/23/16 0301  WBC 7.6 6.8  HGB 9.2* 9.1*  HCT 30.0* 29.8*  PLT 256 262   BMET:  Recent Labs  01/22/16 0330 01/23/16 0301  NA 140 137  K 4.6 4.5  CL 100* 98*  CO2 31 33*  GLUCOSE 106* 94  BUN 15 20  CREATININE 1.10* 1.03*  CALCIUM 8.8* 9.1    PT/INR: No results for input(s): LABPROT, INR in the last 72 hours. ABG    Component Value Date/Time   PHART 7.333* 01/19/2016 0424   HCO3 29.5* 01/19/2016 0424   TCO2 31 01/19/2016 0424   ACIDBASEDEF 1.0 01/18/2016 1120   O2SAT 95.0 01/19/2016 0424   CBG (last 3)  No results for input(s): GLUCAP in the last 72 hours.  Meds Scheduled Meds: . acetaminophen  1,000 mg Oral 4 times per day   Or  . acetaminophen (TYLENOL) oral liquid 160 mg/5 mL  1,000 mg Oral 4 times per day  . aspirin EC  81 mg Oral  Daily  . atorvastatin  40 mg Oral Daily  . bisacodyl  10 mg Oral Daily  . enoxaparin (LOVENOX) injection  30 mg Subcutaneous Q24H  . irbesartan  75 mg Oral Daily  . levalbuterol  0.63 mg Nebulization TID  . mometasone-formoterol  2 puff Inhalation BID  . omega-3 acid ethyl esters  1 g Oral Daily  . senna-docusate  1 tablet Oral QHS   Continuous Infusions: . 0.45 % NaCl with KCl 20 mEq / L Stopped (01/22/16 1200)   PRN Meds:.albuterol, benzonatate, fluticasone, LORazepam, ondansetron (ZOFRAN) IV, oxyCODONE, polyvinyl alcohol, potassium chloride, traMADol  Xrays Dg Chest 2 View  01/23/2016  CLINICAL DATA:  Follow-up pneumothorax EXAM: CHEST  2 VIEW COMPARISON:  Chest radiograph from one day prior. FINDINGS: Surgical hardware from ACDF overlies the lower cervical spine. Stable cardiomediastinal silhouette with normal heart size. Stable small right apical pneumothorax, approximately 5-10%. No left pneumothorax. Trace bilateral pleural effusions. Stable subcutaneous emphysema in the right neck and right chest wall. No pulmonary edema. Surgical sutures and clips overlie the right hilum. Stable mild bibasilar atelectasis. IMPRESSION: 1. Stable small right apical pneumothorax. 2. Stable mild bibasilar atelectasis. 3. Trace bilateral pleural effusions. Electronically Signed   By: Ilona Sorrel M.D.   On: 01/23/2016 07:57   Dg Chest [facility]  01/22/2016  CLINICAL DATA:  Chest tube EXAM: PORTABLE CHEST 1 VIEW COMPARISON:  01/21/2016 FINDINGS: Right apical chest tube. Associated small right apical pneumothorax, slightly increased. Mild subcutaneous emphysema. Mild patchy bilateral lower lobe opacities, likely atelectasis. Right IJ venous catheter in the lower SVC. Cardiomegaly. Cervical spine fixation hardware. IMPRESSION: Right apical chest tube. Associated small right apical pneumothorax, slightly increased. Mild subcutaneous emphysema. Electronically Signed   By: Julian Hy M.D.   On: 01/22/2016  08:15    Assessment/Plan: S/P Procedure(s) (LRB): VIDEO BRONCHOSCOPY (N/A) VIDEO ASSISTED THORACOSCOPY (VATS)/LUNG RESECTION, THOROCOTOMY, RIGHT UPPER LOBECTOMY, LYMPH NODE DISSECTION, PLACEMENT OF ON Q (Right)  1 steady progress 2 cont to wean O2 3 cont pulm toilet and rehab 4 labs stable 5 hemodyn stable, som pac's 6 poss home 1-2 days  LOS: 5 days    GOLD,WAYNE E 01/23/2016  Possible d/c home tomorrow, mat need o2 I have seen and examined [name] and agree with the above assessment  and plan.  [name] MD Bee[place] [phone] Office [phone] 01/23/2016 7:14 PM

## 2016-01-23 NOTE — Discharge Summary (Signed)
Physician Discharge Summary  Patient ID: Tammy Ball MRN: [id number] DOB/AGE: [birth year] 75 y.o.  Admit date: 01/18/2016 Discharge date: 01/24/2016  Admission Diagnoses: SCC of the right upper lobe.  Discharge Diagnoses:  Active Problems:   S/P lobectomy of lung   Patient Active Problem List   Diagnosis Date Noted  . S/P lobectomy of lung 01/18/2016  . Chemotherapy-induced neuropathy ([place]) 11/01/2015  . Non-small cell carcinoma of right lung, stage 2 ([place]) 10/03/2015  . Morbid obesity ([place]) 10/01/2015  . Lung mass 09/19/2015  . CAP (community acquired pneumonia) 09/10/2015  . Anemia 09/10/2015  . Acute renal failure ([place]) 09/10/2015  . Sepsis ([place]) 09/08/2015  . Hyperlipidemia 08/14/2013  . Spondylolisthesis of lumbar region 07/03/2013  . Glaucoma 10/23/2012  . Essential hypertension 12/17/2011  . COPD GOLD II  12/17/2011  . Hearing loss 12/17/2011  . Renal cell cancer ([employer]) 12/17/2011  . Sciatica of left side 12/17/2011  . Depression 12/17/2011  History of present illness:  The patient is a 75 year old female who has a history of non-small cell lung cancer. This is a squamous cell carcinoma diagnosed in November 2016. She has completed 3 cycles of chemotherapy. She has a history of Gold II COPD. She was seen in thoracic surgical consultation by Dr. Servando Snare who underwent full preoperative evaluation. She did additionally require cardiology clearance. She was scheduled for bronchoscopy and resection if deemed to be an operable candidate following the bronchoscopy. She was admitted this hospitalization for the procedure.  Discharged Condition: good  Hospital Course: The patient was admitted electively and on 01/18/2016 taken the operating room where she underwent the below described procedure. She tolerated it well and was taken to the surgical intensive care unit in stable condition. Postoperatively she has made good overall progress. All routine lines, monitors and drainage  devices have been discontinued in the standard fashion. She has required aggressive pulmonary toilet with incentive spirometry as well as nebulized bronchodilators. The pathology report as described below. She is continuing to show good progress in regard to her recovery. She has an expected acute blood loss anemia which is stable. She has some mild volume overload which has improved. Incisions are noted to be healing well without evidence of infection. She is tolerating diet. She is tolerating gradually increasing activities using standard protocols. Currently her status is felt to be tentatively stable for discharge in the next 24-48 hours pending ongoing reevaluation of her recovery.  Consults: None  Treatments: surgery:      OPERATIVE REPORT   PREOPERATIVE DIAGNOSIS: Right upper lobe non-small cell carcinoma of the lung, status post chemotherapy treatment.  POSTOPERATIVE DIAGNOSIS: Right upper lobe non-small cell carcinoma of the lung, status post chemotherapy treatment.  PROCEDURES: Video bronchoscopy, right video-assisted thoracoscopy, mini thoracotomy, right upper lobectomy with lymph node dissection and placement of On-Q device.  SURGEON: Lanelle Bal, MD.  ASSISTANT: Ellwood Handler, PA.-C EPORT OF SURGICAL PATHOLOGY FINAL DIAGNOSIS Diagnosis: Pathology 1. Lung, resection (segmental or lobe), right upper lobe - INVASIVE SQUAMOUS CELL CARCINOMA, MODERATELY DIFFERENTIATED, SPANNING 3.1 CM. - TUMOR IS LIMITED TO LUNG WITHOUT PLEURAL INVASION. - TREATMENT EFFECT PRESENT. - VASCULAR AND FINAL BRONCHIAL RESECTION MARGIN (PART #2) ARE NEGATIVE. - LYMPHOVASCULAR AND PERINEURAL INVASION PRESENT. - ONE OF ONE LYMPH NODES NEGATIVE FOR CARCINOMA (0/1). - SEE ONCOLOGY TABLE. 2. Lung, biopsy, right upper lobe - BENIGN CARTILAGE AND SOFT TISSUE. - NO MALIGNANCY IDENTIFIED. 3. Lymph node, biopsy, 2 4R - METASTATIC CARCINOMA IN ONE OF TWO LYMPH NODES  (1/2). 4.  Lymph node, biopsy, 10R - METASTATIC CARCINOMA IN ONE OF ONE LYMPH NODES (1/1). 5. Lymph node, biopsy, 11R - ONE OF ONE LYMPH NODES NEGATIVE FOR CARCINOMA (0/1). 6. Lymph node, biopsy, 12R - ONE OF ONE LYMPH NODES NEGATIVE FOR CARCINOMA (0/1). 7. Lymph node, biopsy, 12R #2 - METASTATIC CARCINOMA IN ONE OF ONE LYMPH NODES (1/1). 8. Lymph node, biopsy, 10R #2 - METASTATIC CARCINOMA IN ONE OF ONE LYMPH NODES (1/1). 9. Lymph node, biopsy, 12R #3 - ONE OF ONE LYMPH NODES NEGATIVE FOR CARCINOMA (0/1). 10. Lymph node, biopsy, 2R - ONE OF ONE LYMPH NODES NEGATIVE FOR CARCINOMA (0/1). Microscopic Comment 1. LUNG 1 of 3 FINAL for MERRI, DIMAANO ([id number]) Microscopic Comment(continued) Specimen, including laterality: Right upper lobe and regional lymph nodes. Procedure: Right upper lobectomy and lymph node biopsies. Specimen integrity (intact/disrupted): Intact. Tumor site: Right upper lobe. Tumor focality: Unifocal. Maximum tumor size (cm): 3.1 cm. Histologic type: Squamous cell carcinoma. Grade: Moderately differentiated (grade 2). Margins: Final bronchial margin (part #2 is negative). Vascular margins are negative. Distance to closest margin (cm): Approximately 0.1 cm. Visceral pleura invasion: Not identified. Tumor extension: Limited to lung parenchyma. Treatment effect (if treated with neoadjuvant therapy): Present. Lymph -Vascular invasion: Present. Lymph nodes: Number examined - 10 ; Number N1 nodes positive 3 ; Number N2 nodes positive 1 TNM code: ypT2a, pN2 Ancillary studies: Can be performed upon request. Non-neoplastic lung: Focal pleural cysts and fibrosis. Comments: None. Vicente Males MD Pathologist, Electronic Signature (Case signed 01/19/2016) Intraoperative Diagnosis 1. FROZEN SECTION DIAGNOSIS: RIGHT UPPER LOBE BRONCHIAL MARGIN - POSITIVE FOR MALIGNANCY. ([facility]) 2. FROZEN SECTION DIAGNOSIS: FINAL BRONCHIAL MARGIN: NEGATIVE FOR MALIGNANCY (DEEPER  SECTIONS PERFORMED). ([facility]) Specimen Gross and Clinical Information Specimen(s) Obtained: 1. Lung, resection (segmental or lobe), right upper lobe 2. Lung, biopsy, right upper lobe 3. Lymph node, biopsy, 2 4R 4. Lymph node, biopsy, 10R 5. Lymph node, biopsy, 11R 6. Lymph node, biopsy, 12R 7. Lymph node, biopsy, 12R #2 8. Lymph node, biopsy, 10R #2 9. Lymph node, biopsy, 12R #3 10. Lymph node, biopsy, 2R Specimen Clinical Information 1. RUL mass, lung cancer (gat) Gross 1. Specimen: Received fresh for rapid intraoperative consultation labeled right upper lobe. 2 of 3 FINAL for AMEERA, TIGUE ([id number]) Gross(continued) Specimen integrity (intact/incised/disrupted): Intact, with multiple stapled resection line. Size, weight: 15.1 x 10.0 x 2.9 cm, with a weight of 170 grams. Pleura: Pink purple, smooth, with mild anthracosis. At the edge of the specimen, there is a 4.0 x 2.5 cm area of pink white, slightly thickened and cystic pleura identified. Lesion: Identified at the hilum is a 3.1 x 2.9 x 2.0 cm tan gray, ill defined, indurated lesion. The lesion abuts the overlying pleural surface, which is inked black. Approximately 600 mg of fresh tumor are submitted for research purposes. Margin(s):The lesion grossly measures 0.1 cm from the bronchial resection margin, which is submitted for frozen section analysis. Hilar vessels: The lesion abuts, but does not appear to be grossly involve the hilar vasculature. Nonneoplastic parenchyma: Spongy red brown, with mild anthracosis. Lymph nodes, level 12: No lymph nodes are identified. Block Summary: 11 blocks submitted. A= bronchial margin frozen section remnant. B= vascular resection margins C-H= representative sections of lesion. I,J= sections of thickened and cystic pleura. K= grossly uninvolved parenchyma. (KL:gt, 01/19/16) 2. Received fresh for rapid intraoperative consultation and consists of a 1.0 x 0.2 x 0.1 cm piece of tan white  cartilage and soft tissue. The specimen is entirely submitted for frozen section analysis.  The remaining specimen is lost during the frozen section procedure. 3. Received in saline and consists of two pieces of tan red, anthracotic soft tissue measuring 0.6 and 0.7 cm. The specimen is entirely submitted in one cassette. 4. Received in saline and consists of a 1.3 x 1.1 x 0.3 cm aggregate of tan red, anthracotic soft tissue. The specimen is entirely submitted in one cassette. 5. Received in saline and consists of a 0.6 x 0.4 x 0.3 cm piece of tan gray, anthracotic soft tissue. The specimen is entirely submitted in one cassette. 6. Received in saline and consists of a 0.9 x 0.6 x 0.4 cm tan gray, anthracotic possible lymph node. The specimen is entirely submitted in one cassette. 7. Received in saline and consists of a 0.6 x 0.5 x 0.2 cm tan red, anthracotic possible lymph node. The specimen is entirely submitted in one cassette. 8. Received in saline and consists of a 1.7 x 1.2 x 0.6 cm aggregate of tan red, anthracotic soft tissue. The largest portion of tissue is bisected, and the specimen is entirely submitted in one cassette. 9. Received in saline and consists of a 0.9 x 0.6 x 0.5 cm tan gray, anthracotic possible lymph node. The specimen is entirely submitted in one cassette. 10. Received in saline and consists of a 0.6 cm aggregate of tan gray, anthracotic soft tissue. The specimen is entirely submitted in one cassette. (KL:gt, 01/18/16) Report signed out from the following location(s) Technical component and interpretation was performed at [facility], [place], [place] [address]. CLIA #: [id number], 3 of 3   Disposition: 01-Home or Self Care      Discharge Instructions    AMB Referral to [facility] Management    Complete by:  As directed   Please assign to [place] for transition of care. Multiple co-morbidities and x 3 admits in past 6 months. Written  consent obtained. Please call with questions. Marthenia Rolling, [place], [facility] Liaison-[phone]  Reason for consult:  Please assign to [facility] RNCM  Diagnoses of:  COPD/ Pneumonia  Expected date of contact:  1-3 days (reserved for hospital discharges)            Medication List    TAKE these medications        acetaminophen 500 MG tablet  Commonly known as:  TYLENOL  Take 1,000 mg by mouth every 6 (six) hours as needed for mild pain, moderate pain, fever or headache.     albuterol 108 (90 Base) MCG/ACT inhaler  Commonly known as:  PROVENTIL HFA;VENTOLIN HFA  Inhale 2 puffs into the lungs every 4 (four) hours as needed for wheezing or shortness of breath.     aspirin EC 81 MG tablet  Take 81 mg by mouth daily.     atorvastatin 40 MG tablet  Commonly known as:  LIPITOR  TAKE ONE TABLET BY MOUTH ONCE DAILY.     benzonatate 200 MG capsule  Commonly known as:  TESSALON  Take 1 capsule (200 mg total) by mouth 3 (three) times daily as needed for cough.     CALCIUM PO  Take 1 tablet by mouth daily.     fish oil-omega-3 fatty acids 1000 MG capsule  Take 1 capsule by mouth daily.     fluticasone 50 MCG/ACT nasal spray  Commonly known as:  FLONASE  Place 2 sprays into both nostrils daily.     LORazepam 0.5 MG tablet  Commonly known as:  ATIVAN  Take 1 tablet (0.5 mg total) by mouth 2 (two) times daily as needed for anxiety.     mometasone-formoterol 200-5 MCG/ACT Aero  Commonly known as:  DULERA  Inhale 2 puffs into the lungs 2 (two) times daily.     oxyCODONE-acetaminophen 5-325 MG tablet  Commonly known as:  PERCOCET/ROXICET  Take 1-2 tablets by mouth every 6 (six) hours as needed for severe pain.     REFRESH 1.4-0.6 % ophthalmic solution  Generic drug:  polyvinyl alcohol-povidone  Place 2 drops into both eyes daily as needed (for dry eyes).     traMADol 50 MG tablet  Commonly known as:  ULTRAM  TAKE 1-2 TABLETS ( 50-100 MG TOTAL) BY MOUTH  EVERY 6 (SIX) HOURS  AS NEEDED FOR PAIN     UNABLE TO FIND  Med Name: Wig     valsartan 80 MG tablet  Commonly known as:  DIOVAN  Take 1 tablet (80 mg total) by mouth daily.     VITAMIN D-3 PO  Take 1 tablet by mouth daily.       Follow-up Information    Follow up with [name], MD On 02/09/2016.   Specialty:  Cardiothoracic Surgery   Why:  Appointment is at 9:30, please get CXR at 9:00 at [facility] located on first floor of our office building   Contact information:   [place] [address] [phone]       Follow up with [facility] On 01/30/2016.   Specialty:  Cardiothoracic Surgery   Why:  For suture removal at 10:30   Contact information:   [place], [place] [phone]      Signed: Ellwood Handler 01/24/2016, 10:16 AM

## 2016-01-23 NOTE — Progress Notes (Signed)
I have assumed the care the pt at this time. Pt is resting comfortably in bed and is in no distress. Will continue to monitor.   Grant Fontana RN, BSN

## 2016-01-23 NOTE — Progress Notes (Signed)
Utilization review completed.  

## 2016-01-24 MED ORDER — OXYCODONE-ACETAMINOPHEN 5-325 MG PO TABS: 1.0000 | ORAL_TABLET | Freq: Four times a day (QID) | ORAL | Status: DC | PRN

## 2016-01-24 MED ORDER — LEVALBUTEROL HCL 0.63 MG/3ML IN NEBU
0.6300 mg | INHALATION_SOLUTION | Freq: Four times a day (QID) | RESPIRATORY_TRACT | Status: DC | PRN
Start: 2016-01-24 — End: 2016-01-24

## 2016-01-24 NOTE — Progress Notes (Signed)
Physical Therapy Treatment Patient Details Name: Tammy Ball MRN: [id number] DOB: [birth year] Today's Date: 01/24/2016    History of Present Illness Patient is a 75 y/o female with hx of COPD, HTN, depression, glaucoma, HLD, lung ca, renal cell carcinoma, anxiety s/p VATs and lobectomy.     PT Comments    Patient progressing well towards PT goals. Session focused on stair training using RW to ascend steps with assist for family. Pt not able to maintain Sp02 on RA, dropping to 82%, asymptomatic. Pt will require supplemental 02 and RW at home at discharge. Education provided on pursed lip breathing. Will continue to follow.  Follow Up Recommendations  Home health PT;Supervision - Intermittent     Equipment Recommendations  Rolling walker with 5" wheels;Other (comment) (supplemental 02)    Recommendations for Other Services       Precautions / Restrictions Precautions Precautions: Fall Precaution Comments: monitor 02 Restrictions Weight Bearing Restrictions: No    Mobility  Bed Mobility               General bed mobility comments: Up in chair upon PT arrival.   Transfers Overall transfer level: Needs assistance Equipment used: None Transfers: Sit to/from Stand Sit to Stand: Supervision         General transfer comment: Supervision for safety. Stood from chair x1, from [employer], transferred to chair post ambulation bout.  Ambulation/Gait Ambulation/Gait assistance: Supervision Ambulation Distance (Feet): 150 Feet (x2 bouts) Assistive device: Rolling walker (2 wheeled) Gait Pattern/deviations: Step-through pattern;Decreased stride length Gait velocity: decreased   General Gait Details: Slow, steady gait. Ambulated without 02 and Sp02 dropped to 82%; ambulated on 1L/min 02 and Sp02 ranged from 88-91%. Cues for pursed lip breathing. 1 seated rest break.   Stairs Stairs: Yes Stairs assistance: Min assist Stair Management: Backwards;With walker Number of  Stairs: 2 General stair comments: Cues for technique with therapist stabilizing RW for support.   Wheelchair Mobility    Modified Rankin (Stroke Patients Only)       Balance Overall balance assessment: Needs assistance Sitting-balance support: Feet supported;No upper extremity supported Sitting balance-Leahy Scale: Good     Standing balance support: During functional activity Standing balance-Leahy Scale: Fair Standing balance comment: Able to perform short distance ambulation within rom furniture walking.                    Cognition Arousal/Alertness: Awake/alert Behavior During Therapy: WFL for tasks assessed/performed Overall Cognitive Status: Within Functional Limits for tasks assessed                      Exercises      General Comments        Pertinent Vitals/Pain Pain Assessment: No/denies pain    Home Living                      Prior Function            PT Goals (current goals can now be found in the care plan section) Progress towards PT goals: Progressing toward goals    Frequency  Min 3X/week    PT Plan Current plan remains appropriate    Co-evaluation             End of Session Equipment Utilized During Treatment: Oxygen;Gait belt Activity Tolerance: Patient tolerated treatment well Patient left: in chair;with call bell/phone within reach     Time: 0928-0951 PT Time Calculation (min) (ACUTE ONLY): 23 min  Charges:  $Gait Training: 23-37 mins                    G Codes:      Lacie Draft 01/24/2016, 11:57 AM  Wray Kearns, PT, DPT [phone]

## 2016-01-24 NOTE — Care Management Note (Signed)
Case Management Note Marvetta Gibbons RN, BSN Unit 2W-Case Manager [phone]  Patient Details  Name: Tammy Ball MRN: [id number] Date of Birth: [birth year]  Subjective/Objective:   Pt admitted s/p lobectomy                 Action/Plan: PTA pt lived at home alone- per conversation with pt- has family nearby that can provide support but not 24/7 care- may benefit from PT eval- has been using RW here- does not have one at home- may need DME RW for home- may also need HH at discharge - also is still on 2L of 02- weaning- may need home 02- has used [facility] for home 02 in the past. CM to follow   Expected Discharge Date:    01/24/16              Expected Discharge Plan:  [place]  In-House Referral:     Discharge planning Services  CM Consult  Post Acute Care Choice:  Home Health, Durable Medical Equipment Choice offered to:  Patient  DME Arranged:  Walker rolling, Oxygen DME Agency:  [place]:  PT [facility] Agency:  [place]  Status of Service:  Completed, signed off  Medicare Important Message Given:  Yes Date Medicare IM Given:    Medicare IM give by:    Date Additional Medicare IM Given:    Additional Medicare Important Message give by:     If discussed at [place] of Stay Meetings, dates discussed:  01/24/16  Discharge Disposition: home/home health  Additional Comments:  01/24/16- [name] RN, BSN- pt for d/c home today- will need home 02- as previously discussed with pt she has home 02 with [facility]- spoke with Colletta Maryland at [facility]- will deliver portable 02 tank to room prior to discharge- pt will also need RW which will also be brought to room prior to discharge by [facility]. Spoke with pt at bedside regarding [place] - choice offered for [facility]- per pt she would also like to use [facility] for [facility] services- referral called to [name] with [facility] for HHPT- confirmed pt's street address as [address] [name]- phone  # is correct in epic.   Dawayne Patricia, RN 01/24/2016, 11:59 AM

## 2016-01-24 NOTE — Progress Notes (Signed)
D/c'd to home w/ son via W/C.voices no c/o.All belongings given as well as d/c instructions and one script.

## 2016-01-24 NOTE — Progress Notes (Signed)
Home O2 in room  Awaiting ride

## 2016-01-24 NOTE — Progress Notes (Signed)
SATURATION QUALIFICATIONS: (This note is used to comply with regulatory documentation for home oxygen)  Patient Saturations on Room Air at Rest = 87%  Patient Saturations on Room Air while Ambulating = 82%  Patient Saturations on 1 Liter of oxygen while Ambulating = 88-91%  Please briefly explain why patient needs home oxygen: Pt not able to maintain oxygen saturations on RA during mobility requiring home 02.  Wray Kearns, [place], DPT [phone]

## 2016-01-24 NOTE — Care Management Important Message (Signed)
Important Message  Patient Details  Name: Tammy Ball MRN: [id number] Date of Birth: 1941-07-16   Medicare Important Message Given:  Yes    Nathen May 01/24/2016, 11:31 AM

## 2016-01-25 ENCOUNTER — Other Ambulatory Visit: Payer: Self-pay | Admitting: *Deleted

## 2016-01-25 DIAGNOSIS — C3491 Malignant neoplasm of unspecified part of right bronchus or lung: Secondary | ICD-10-CM

## 2016-01-25 DIAGNOSIS — T451X5A Adverse effect of antineoplastic and immunosuppressive drugs, initial encounter: Secondary | ICD-10-CM

## 2016-01-25 DIAGNOSIS — G8918 Other acute postprocedural pain: Secondary | ICD-10-CM

## 2016-01-25 DIAGNOSIS — M4316 Spondylolisthesis, lumbar region: Secondary | ICD-10-CM

## 2016-01-25 DIAGNOSIS — M5432 Sciatica, left side: Secondary | ICD-10-CM

## 2016-01-25 DIAGNOSIS — G620 Drug-induced polyneuropathy: Secondary | ICD-10-CM

## 2016-01-25 LAB — TYPE AND SCREEN
ABO/RH(D): A POS
Antibody Screen: NEGATIVE
Unit division: 0
Unit division: 0

## 2016-01-25 MED ORDER — OXYCODONE-ACETAMINOPHEN 5-325 MG PO TABS: 1.0000 | ORAL_TABLET | Freq: Four times a day (QID) | ORAL | Status: DC | PRN

## 2016-01-26 ENCOUNTER — Other Ambulatory Visit: Payer: Self-pay | Admitting: *Deleted

## 2016-01-26 DIAGNOSIS — F329 Major depressive disorder, single episode, unspecified: Secondary | ICD-10-CM | POA: Diagnosis not present

## 2016-01-26 DIAGNOSIS — C3411 Malignant neoplasm of upper lobe, right bronchus or lung: Secondary | ICD-10-CM | POA: Diagnosis not present

## 2016-01-26 DIAGNOSIS — J449 Chronic obstructive pulmonary disease, unspecified: Secondary | ICD-10-CM | POA: Diagnosis not present

## 2016-01-26 DIAGNOSIS — G620 Drug-induced polyneuropathy: Secondary | ICD-10-CM | POA: Diagnosis not present

## 2016-01-26 DIAGNOSIS — T451X5D Adverse effect of antineoplastic and immunosuppressive drugs, subsequent encounter: Secondary | ICD-10-CM | POA: Diagnosis not present

## 2016-01-26 DIAGNOSIS — Z85528 Personal history of other malignant neoplasm of kidney: Secondary | ICD-10-CM | POA: Diagnosis not present

## 2016-01-26 DIAGNOSIS — Z483 Aftercare following surgery for neoplasm: Secondary | ICD-10-CM | POA: Diagnosis not present

## 2016-01-26 DIAGNOSIS — Z87891 Personal history of nicotine dependence: Secondary | ICD-10-CM | POA: Diagnosis not present

## 2016-01-26 DIAGNOSIS — I10 Essential (primary) hypertension: Secondary | ICD-10-CM | POA: Diagnosis not present

## 2016-01-26 NOTE — Patient Outreach (Signed)
[facility]) Care Management  01/26/2016  Tammy Ball [birth year] [id number]  Transition of care (week 1)  RN spoke with pt briefly concerning the [facility] program and services. Pt receptive indicated she had someone available to assist with giving her a bath and requested RN to call back in the morning. RN will follow up accordingly as requested and further inquire on pt's needs in the AM.  Raina Mina, RN Care Management Coordinator [place] Office [phone]

## 2016-01-27 ENCOUNTER — Other Ambulatory Visit: Payer: Self-pay | Admitting: *Deleted

## 2016-01-27 ENCOUNTER — Encounter: Payer: Self-pay | Admitting: *Deleted

## 2016-01-27 ENCOUNTER — Telehealth: Payer: Self-pay | Admitting: *Deleted

## 2016-01-27 DIAGNOSIS — C3491 Malignant neoplasm of unspecified part of right bronchus or lung: Secondary | ICD-10-CM

## 2016-01-27 NOTE — Telephone Encounter (Signed)
Oncology Nurse Navigator Documentation  Oncology Nurse Navigator Flowsheets 01/27/2016  Navigator Location [facility]-Med Onc  Navigator Encounter Type Telephone  Telephone Outgoing Call  Abnormal Finding Date 09/19/2015  Confirmed Diagnosis Date 09/20/2015  Surgery Date 01/18/2016  Treatment Initiated Date 10/11/2015  Patient Visit Type Follow-up  Treatment Phase Treatment  Barriers/Navigation Needs Coordination of Care  Education Other  Interventions Coordination of Care  Coordination of Care Appts  Acuity Level 1  Time Spent with Patient 15   Called and spoke with patient today.  I updated her that her case was presented at [place] yesterday and next steps is for her to be set up for follow up with Med Onc and see Rad Onc.  I scheduled her to be seen at [facility] on 02/09/16.  She verbalized understanding of appt time and place.

## 2016-01-27 NOTE — Patient Outreach (Addendum)
[place] [facility]) Care Management  01/27/2016  Tammy Ball [birth year] [id number]   Transition of care (week 1/2nd attempt)  RN introduced [facility] services and the purpose once again for the call. RN spoke with pt today who indicated she is recovering well with no encountered problems. Reports [facility] remains involved with several visits in the week for the next 3 weeks. States she is doing much better now that she has returned home with some assistance form her daughter for ADLs. Pt able to completed the transition of care template and recite all upcoming appointments over the next month. RN briefly covered pt's medical history and offered commiunty home visits however pt declined but receptive to ongoing transition of care call over the next few weeks. RN offered to scheduled the next telephone call (Monday at 1200). Plan of care discussed along with goals for ongoing follow up.  Will follow up as scheduled and re-evaluate pt's progress with her set goals.  Raina Mina, RN Care Management Coordinator [place] Office [phone] i

## 2016-01-30 ENCOUNTER — Ambulatory Visit (INDEPENDENT_AMBULATORY_CARE_PROVIDER_SITE_OTHER): Payer: Self-pay

## 2016-01-30 DIAGNOSIS — C3491 Malignant neoplasm of unspecified part of right bronchus or lung: Secondary | ICD-10-CM

## 2016-01-30 DIAGNOSIS — Z4802 Encounter for removal of sutures: Secondary | ICD-10-CM

## 2016-01-30 DIAGNOSIS — Z902 Acquired absence of lung [part of]: Secondary | ICD-10-CM

## 2016-01-30 NOTE — Progress Notes (Signed)
Removed 4 sutures from chest tube incision sites. No signs of infection and patient tolerated well.

## 2016-01-31 ENCOUNTER — Other Ambulatory Visit: Payer: Self-pay | Admitting: *Deleted

## 2016-01-31 DIAGNOSIS — C3411 Malignant neoplasm of upper lobe, right bronchus or lung: Secondary | ICD-10-CM | POA: Diagnosis not present

## 2016-01-31 DIAGNOSIS — Z483 Aftercare following surgery for neoplasm: Secondary | ICD-10-CM | POA: Diagnosis not present

## 2016-01-31 DIAGNOSIS — J449 Chronic obstructive pulmonary disease, unspecified: Secondary | ICD-10-CM | POA: Diagnosis not present

## 2016-01-31 DIAGNOSIS — T451X5D Adverse effect of antineoplastic and immunosuppressive drugs, subsequent encounter: Secondary | ICD-10-CM | POA: Diagnosis not present

## 2016-01-31 DIAGNOSIS — G620 Drug-induced polyneuropathy: Secondary | ICD-10-CM | POA: Diagnosis not present

## 2016-01-31 DIAGNOSIS — I10 Essential (primary) hypertension: Secondary | ICD-10-CM | POA: Diagnosis not present

## 2016-01-31 NOTE — Patient Outreach (Addendum)
[place] [facility]) Care Management  01/30/2016  Tammy Ball 1941-05-11 [id number]   Transition of care ( week 2)  RN spoke with pt concerning the [facility] program and services. Pt indicates she was in in/out of bed and shower. States she will follow up with the surgeon and has an appointment with her oncologist on 3/30. Continues to reports [facility] involvement for the next few weeks with no reported problems. Again pt only receptive at this time to ongoing telephonic calls for ongoing transition of care. Will follow up next week on pt's ongoing recovery.   Raina Mina, RN Care Management Coordinator [place] Office [phone]

## 2016-02-02 DIAGNOSIS — C3411 Malignant neoplasm of upper lobe, right bronchus or lung: Secondary | ICD-10-CM | POA: Diagnosis not present

## 2016-02-02 DIAGNOSIS — J449 Chronic obstructive pulmonary disease, unspecified: Secondary | ICD-10-CM | POA: Diagnosis not present

## 2016-02-02 DIAGNOSIS — T451X5D Adverse effect of antineoplastic and immunosuppressive drugs, subsequent encounter: Secondary | ICD-10-CM | POA: Diagnosis not present

## 2016-02-02 DIAGNOSIS — I10 Essential (primary) hypertension: Secondary | ICD-10-CM | POA: Diagnosis not present

## 2016-02-02 DIAGNOSIS — Z483 Aftercare following surgery for neoplasm: Secondary | ICD-10-CM | POA: Diagnosis not present

## 2016-02-02 DIAGNOSIS — G620 Drug-induced polyneuropathy: Secondary | ICD-10-CM | POA: Diagnosis not present

## 2016-02-03 ENCOUNTER — Other Ambulatory Visit: Payer: Self-pay | Admitting: *Deleted

## 2016-02-03 DIAGNOSIS — C3491 Malignant neoplasm of unspecified part of right bronchus or lung: Secondary | ICD-10-CM

## 2016-02-03 DIAGNOSIS — M4316 Spondylolisthesis, lumbar region: Secondary | ICD-10-CM

## 2016-02-03 DIAGNOSIS — T451X5A Adverse effect of antineoplastic and immunosuppressive drugs, initial encounter: Secondary | ICD-10-CM

## 2016-02-03 DIAGNOSIS — G8918 Other acute postprocedural pain: Secondary | ICD-10-CM

## 2016-02-03 DIAGNOSIS — G620 Drug-induced polyneuropathy: Secondary | ICD-10-CM

## 2016-02-03 DIAGNOSIS — M5432 Sciatica, left side: Secondary | ICD-10-CM

## 2016-02-03 MED ORDER — OXYCODONE-ACETAMINOPHEN 5-325 MG PO TABS: 1.0000 | ORAL_TABLET | Freq: Four times a day (QID) | ORAL | Status: DC | PRN

## 2016-02-03 NOTE — Telephone Encounter (Signed)
Tammy Ball has called requesting a refill for her Percocet s/p 01/18/16 thoracic surgery.  I informed her that a new signed script would be available at our office today and she agreed.

## 2016-02-06 DIAGNOSIS — C3411 Malignant neoplasm of upper lobe, right bronchus or lung: Secondary | ICD-10-CM | POA: Diagnosis not present

## 2016-02-06 DIAGNOSIS — J449 Chronic obstructive pulmonary disease, unspecified: Secondary | ICD-10-CM | POA: Diagnosis not present

## 2016-02-06 DIAGNOSIS — I10 Essential (primary) hypertension: Secondary | ICD-10-CM | POA: Diagnosis not present

## 2016-02-06 DIAGNOSIS — T451X5D Adverse effect of antineoplastic and immunosuppressive drugs, subsequent encounter: Secondary | ICD-10-CM | POA: Diagnosis not present

## 2016-02-06 DIAGNOSIS — Z483 Aftercare following surgery for neoplasm: Secondary | ICD-10-CM | POA: Diagnosis not present

## 2016-02-06 DIAGNOSIS — G620 Drug-induced polyneuropathy: Secondary | ICD-10-CM | POA: Diagnosis not present

## 2016-02-08 ENCOUNTER — Other Ambulatory Visit: Payer: Self-pay | Admitting: Cardiothoracic Surgery

## 2016-02-08 DIAGNOSIS — C3411 Malignant neoplasm of upper lobe, right bronchus or lung: Secondary | ICD-10-CM | POA: Diagnosis not present

## 2016-02-08 DIAGNOSIS — G620 Drug-induced polyneuropathy: Secondary | ICD-10-CM | POA: Diagnosis not present

## 2016-02-08 DIAGNOSIS — J449 Chronic obstructive pulmonary disease, unspecified: Secondary | ICD-10-CM | POA: Diagnosis not present

## 2016-02-08 DIAGNOSIS — I10 Essential (primary) hypertension: Secondary | ICD-10-CM | POA: Diagnosis not present

## 2016-02-08 DIAGNOSIS — T451X5D Adverse effect of antineoplastic and immunosuppressive drugs, subsequent encounter: Secondary | ICD-10-CM | POA: Diagnosis not present

## 2016-02-08 DIAGNOSIS — C3490 Malignant neoplasm of unspecified part of unspecified bronchus or lung: Secondary | ICD-10-CM

## 2016-02-08 DIAGNOSIS — Z483 Aftercare following surgery for neoplasm: Secondary | ICD-10-CM | POA: Diagnosis not present

## 2016-02-09 ENCOUNTER — Other Ambulatory Visit (HOSPITAL_BASED_OUTPATIENT_CLINIC_OR_DEPARTMENT_OTHER): Payer: Medicare Other

## 2016-02-09 ENCOUNTER — Encounter: Payer: Self-pay | Admitting: *Deleted

## 2016-02-09 ENCOUNTER — Ambulatory Visit (INDEPENDENT_AMBULATORY_CARE_PROVIDER_SITE_OTHER): Payer: Self-pay | Admitting: Cardiothoracic Surgery

## 2016-02-09 ENCOUNTER — Ambulatory Visit
Admission: RE | Admit: 2016-02-09 | Discharge: 2016-02-09 | Disposition: A | Discharge status: Home / Self Care | Payer: Medicare Other | Source: Ambulatory Visit | Attending: Cardiothoracic Surgery | Admitting: Cardiothoracic Surgery

## 2016-02-09 ENCOUNTER — Other Ambulatory Visit: Payer: Self-pay | Admitting: *Deleted

## 2016-02-09 ENCOUNTER — Telehealth: Payer: Self-pay | Admitting: Internal Medicine

## 2016-02-09 ENCOUNTER — Encounter: Payer: Self-pay | Admitting: Cardiothoracic Surgery

## 2016-02-09 ENCOUNTER — Encounter: Payer: Self-pay | Admitting: Internal Medicine

## 2016-02-09 ENCOUNTER — Ambulatory Visit (HOSPITAL_BASED_OUTPATIENT_CLINIC_OR_DEPARTMENT_OTHER): Payer: Medicare Other | Admitting: Internal Medicine

## 2016-02-09 ENCOUNTER — Ambulatory Visit
Admission: RE | Admit: 2016-02-09 | Discharge: 2016-02-09 | Disposition: A | Discharge status: Home / Self Care | Payer: Medicare Other | Source: Ambulatory Visit | Attending: Radiation Oncology | Admitting: Radiation Oncology

## 2016-02-09 VITALS — BP 136/79 | HR 85 | Resp 16 | Ht 64.0 in | Wt 175.6 lb

## 2016-02-09 VITALS — BP 140/54 | HR 85 | Temp 98.3°F | Resp 18 | Ht 64.0 in | Wt 176.1 lb

## 2016-02-09 DIAGNOSIS — G620 Drug-induced polyneuropathy: Secondary | ICD-10-CM

## 2016-02-09 DIAGNOSIS — C3411 Malignant neoplasm of upper lobe, right bronchus or lung: Secondary | ICD-10-CM

## 2016-02-09 DIAGNOSIS — R5383 Other fatigue: Secondary | ICD-10-CM | POA: Diagnosis not present

## 2016-02-09 DIAGNOSIS — C3491 Malignant neoplasm of unspecified part of right bronchus or lung: Secondary | ICD-10-CM

## 2016-02-09 DIAGNOSIS — T451X5A Adverse effect of antineoplastic and immunosuppressive drugs, initial encounter: Secondary | ICD-10-CM

## 2016-02-09 DIAGNOSIS — C3490 Malignant neoplasm of unspecified part of unspecified bronchus or lung: Secondary | ICD-10-CM

## 2016-02-09 DIAGNOSIS — C778 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: Secondary | ICD-10-CM | POA: Diagnosis not present

## 2016-02-09 DIAGNOSIS — Z902 Acquired absence of lung [part of]: Secondary | ICD-10-CM

## 2016-02-09 DIAGNOSIS — Z5111 Encounter for antineoplastic chemotherapy: Secondary | ICD-10-CM

## 2016-02-09 HISTORY — DX: Encounter for antineoplastic chemotherapy: Z51.11

## 2016-02-09 LAB — COMPREHENSIVE METABOLIC PANEL
ALT: 13 U/L (ref 0–55)
AST: 14 U/L (ref 5–34)
Albumin: 3.9 g/dL (ref 3.5–5.0)
Alkaline Phosphatase: 88 U/L (ref 40–150)
Anion Gap: 8 mEq/L (ref 3–11)
BUN: 32.2 mg/dL — ABNORMAL HIGH (ref 7.0–26.0)
CO2: 28 mEq/L (ref 22–29)
Calcium: 10 mg/dL (ref 8.4–10.4)
Chloride: 107 mEq/L (ref 98–109)
Creatinine: 1.2 mg/dL — ABNORMAL HIGH (ref 0.6–1.1)
EGFR: 44 mL/min/{1.73_m2} — ABNORMAL LOW (ref >90)
Glucose: 121 mg/dl (ref 70–140)
Potassium: 4.8 mEq/L (ref 3.5–5.1)
Sodium: 143 mEq/L (ref 136–145)
Total Bilirubin: <0.3 mg/dL (ref 0.20–1.20)
Total Protein: 7.5 g/dL (ref 6.4–8.3)

## 2016-02-09 LAB — CBC WITH DIFFERENTIAL/PLATELET
BASO%: 0.4 % (ref 0.0–2.0)
Basophils Absolute: 0 10*3/uL (ref 0.0–0.1)
EOS%: 1.7 % (ref 0.0–7.0)
Eosinophils Absolute: 0.1 10*3/uL (ref 0.0–0.5)
HCT: 34.6 % — ABNORMAL LOW (ref 34.8–46.6)
HGB: 11.3 g/dL — ABNORMAL LOW (ref 11.6–15.9)
LYMPH%: 29.7 % (ref 14.0–49.7)
MCH: 32.7 pg (ref 25.1–34.0)
MCHC: 32.7 g/dL (ref 31.5–36.0)
MCV: 100 fL (ref 79.5–101.0)
MONO#: 0.9 10*3/uL (ref 0.1–0.9)
MONO%: 11.5 % (ref 0.0–14.0)
NEUT#: 4.5 10*3/uL (ref 1.5–6.5)
NEUT%: 56.7 % (ref 38.4–76.8)
Platelets: 358 10*3/uL (ref 145–400)
RBC: 3.46 10*6/uL — ABNORMAL LOW (ref 3.70–5.45)
RDW: 16 % — ABNORMAL HIGH (ref 11.2–14.5)
WBC: 7.9 10*3/uL (ref 3.9–10.3)
lymph#: 2.3 10*3/uL (ref 0.9–3.3)

## 2016-02-09 NOTE — Progress Notes (Signed)
Radiation Oncology         [phone] ________________________________  Initial Outpatient Consultation  Name: Tammy Ball MRN: [id number]  Date: 02/09/2016  DOB: 1941/06/28  UT:[id number],FKCL, MD  Curt Bears, MD   REFERRING PHYSICIAN: Curt Bears, MD  DIAGNOSIS: Stage III (ypT2a, pN2, M0) non-small cell lung cancer, squamous cell carcinoma  Oncology History   Patient followed due to hx NSCLC stage IIA squamous cell dx 09/2015 s/p neoadjuvant chemotherapy   Non-small cell carcinoma of right lung, stage 2 ([place])   Staging form: Lung, AJCC 7th Edition     Clinical stage from 10/03/2015: Stage IIA (T2a, N1, M0) - Signed by Curt Bears, MD on 10/03/2015 Renal cell cancer [facility])   Staging form: Kidney, AJCC 7th Edition     Clinical: No stage assigned - Unsigned       Non-small cell carcinoma of right lung, stage 2 ([place])   09/19/2015 Imaging CTA Irregular opacity in the medial right upper lobe extending to and possibly arising from the right hilum.    09/20/2015 Surgery VIDEO BRONCHOSCOPY WITHOUT FLUORO   09/29/2015 Imaging PET IMPRESSION: 4 cm hypermetabolic central right upper lobe mass, consistent with primary bronchogenic carcinoma. This is contiguous with the right hilum.   10/03/2015 Initial Diagnosis Non-small cell carcinoma of right lung, stage 2 ([place])   10/11/2015 - 11/22/2015 Chemotherapy taxol/carbo   10/21/2015 Imaging MRI Brain No pathologic enhancement to suggest metastatic disease of the brain or meninges.   11/19/2015 Imaging CXR IMPRESSION: There is airspace consolidation in the right upper lobe medially   12/13/2015 Imaging CT Chest IMPRESSION: Marked response to therapy. Significant decrease in size of a central right upper lobe pulmonary nodule with increase in cavitation.   PRIOR THERAPY:  1) Neoadjuvant systemic chemotherapy with carboplatin for AUC of 6 and paclitaxel 200 MG/M2 every 3 weeks, status post 3 cycles with partial response. 2) Video  bronchoscopy, right video-assisted thoracoscopy, minithoracotomy, right upper lobectomy with lymph node dissection on 01/18/2016. The final pathology revealed residual squamous cell carcinoma measuring 3.1 cm with close resection margin and metastatic carcinoma to lymph nodes at levels 10R, 12R and 4R. (ypT2a, yN2).    HISTORY OF PRESENT ILLNESS::Tammy Ball is a 75 y.o. female who was found to have pneumonia, sepsis, and renal insufficiency in the fall of 2016. She was referred to medical service for sepsis from [facility]. Bronchoscopy by Dr. Elsworth Soho revealed a whitish endobrochial mass seen blocking entry of the RUl bronchus. She was diagnosed with clinical stage IIa squamous cell carcinoma and underwent 3 cycles of chemotherapy with significant response. A PET scan was performed that showed no evidence of metastatic disease. CT scan on 12/13/15 of the chest showed partial response with significant decrease in the size of the central right upper lobe pulmonary nodule and increased cavitation. There was no evidence for thoracic adenopathy. On March 8 she underwent right upper lobectomy and node dissection. The final pathology revealed residual squamous cell carcinoma measuring 3.1 cm with a close resection margin and metastatic lymph nodes to the right hilar and mediastinal lymphadenopathy. She presents today to discuss radiation treatment in a post-operative setting.  On today's visit, the patient states she is recovering well since surgery. She manages her pain well with her medication, she is back to work. She complains of mild SOB. Denies back pain. States occasional visual "floaters".  PREVIOUS RADIATION THERAPY: No  PAST MEDICAL HISTORY:  has a past medical history of COPD (chronic obstructive pulmonary disease) ([place]); Hypertension; Depression;  Asthma; Glaucoma; Hyperlipidemia; Heart murmur; Non-small cell carcinoma of right lung, stage 2 ([place]) (10/03/2015); Chemotherapy-induced neuropathy ([place])  (11/01/2015); History of nuclear stress test; History of echocardiogram; Renal cell carcinoma ([place]); Anxiety; Claustrophobia; Headache; Anemia; and Encounter for antineoplastic chemotherapy (02/09/2016).    PAST SURGICAL HISTORY: Past Surgical History  Procedure Laterality Date  . Nephrectomy    . Kidney cancer    . Tubal ligation    . Spine surgery    . Eye surgery    . Video bronchoscopy Bilateral 09/20/2015    Procedure: VIDEO BRONCHOSCOPY WITHOUT FLUORO;  Surgeon: Rigoberto Noel, MD;  Location: [facility];  Service: Cardiopulmonary;  Laterality: Bilateral;  . Back surgery      cervical 1991  . Video bronchoscopy N/A 01/18/2016    Procedure: VIDEO BRONCHOSCOPY;  Surgeon: [name], MD;  Location: [facility];  Service: Thoracic;  Laterality: N/A;  . Video assisted thoracoscopy (vats)/wedge resection Right 01/18/2016    Procedure: VIDEO ASSISTED THORACOSCOPY (VATS)/LUNG RESECTION, THOROCOTOMY, RIGHT UPPER LOBECTOMY, LYMPH NODE DISSECTION, PLACEMENT OF ON Q;  Surgeon: [name], MD;  Location: [facility] OR;  Service: Thoracic;  Laterality: Right;    FAMILY HISTORY: family history includes Birth defects in her sister; Breast cancer in her sister; CAD (age of onset: 49) in her mother; Cancer in her maternal grandmother and mother; Glaucoma in her daughter; Heart attack (age of onset: 100) in her daughter; Heart disease in her sister; Hypertension in her mother; Obesity in her brother.  SOCIAL HISTORY:  reports that she quit smoking about 4 years ago. Her smoking use included Cigarettes. She has a 25 pack-year smoking history. She has never used smokeless tobacco. She reports that she does not drink alcohol or use illicit drugs.  ALLERGIES: Bee venom; Amlodipine; Levofloxacin; and Hctz  MEDICATIONS:  Current Outpatient Prescriptions  Medication Sig Dispense Refill  . acetaminophen (TYLENOL) 500 MG tablet Take 1,000 mg by mouth every 6 (six) hours as needed for mild pain, moderate pain, fever  or headache. Reported on 02/09/2016    . albuterol (PROVENTIL HFA;VENTOLIN HFA) 108 (90 BASE) MCG/ACT inhaler Inhale 2 puffs into the lungs every 4 (four) hours as needed for wheezing or shortness of breath. 1 Inhaler 0  . aspirin EC 81 MG tablet Take 81 mg by mouth daily.    Marland Kitchen atorvastatin (LIPITOR) 40 MG tablet TAKE ONE TABLET BY MOUTH ONCE DAILY. (Patient taking differently: Take 40 mg by mouth daily. ) 90 tablet 3  . benzonatate (TESSALON) 200 MG capsule Take 1 capsule (200 mg total) by mouth 3 (three) times daily as needed for cough. (Patient not taking: Reported on 01/06/2016) 20 capsule 0  . CALCIUM PO Take 1 tablet by mouth daily.    . Cholecalciferol (VITAMIN D-3 PO) Take 1 tablet by mouth daily.    . fish oil-omega-3 fatty acids 1000 MG capsule Take 1 capsule by mouth daily.     . fluticasone (FLONASE) 50 MCG/ACT nasal spray Place 2 sprays into both nostrils daily. (Patient not taking: Reported on 02/09/2016) 9.9 g 0  . LORazepam (ATIVAN) 0.5 MG tablet Take 1 tablet (0.5 mg total) by mouth 2 (two) times daily as needed for anxiety. (Patient not taking: Reported on 02/09/2016) 10 tablet 1  . mometasone-formoterol (DULERA) 200-5 MCG/ACT AERO Inhale 2 puffs into the lungs 2 (two) times daily. 3 Inhaler 0  . oxyCODONE-acetaminophen (PERCOCET/ROXICET) 5-325 MG tablet Take 1-2 tablets by mouth every 6 (six) hours as needed for severe pain. 30 tablet  0  . polyvinyl alcohol-povidone (REFRESH) 1.4-0.6 % ophthalmic solution Place 2 drops into both eyes daily as needed (for dry eyes).    . traMADol (ULTRAM) 50 MG tablet Reported on 02/09/2016    . UNABLE TO FIND Med Name: Wig 1 each 0  . valsartan (DIOVAN) 80 MG tablet Take 1 tablet (80 mg total) by mouth daily. 90 tablet 0   No current facility-administered medications for this encounter.    REVIEW OF SYSTEMS:  A 15 point review of systems is documented in the electronic medical record. This was obtained by the nursing staff. However, I reviewed this  with the patient to discuss relevant findings and make appropriate changes.  Pertinent items are noted in HPI.   PHYSICAL EXAM:  Vitals with Age-Percentiles 02/09/2016  Length 175.1 cm  Systolic 025  Diastolic 54  MAP   Pulse 85  Respiration 18  Weight 79.878 kg  BMI 30.3   General: Alert and oriented, in no acute distress HEENT: Head is normocephalic. Extraocular movements are intact. Oropharynx is clear. Neck: Neck is supple, no palpable cervical or supraclavicular lymphadenopathy. Heart: Regular in rate and rhythm with no murmurs, rubs, or gallops. Chest: Clear to auscultation bilaterally, with no rhonchi, wheezes, or rales.   Right lateral chest wall scar with no signs of drainage or infection. Abdomen: Soft, nontender, nondistended, with no rigidity or guarding.  Extremities: No cyanosis or edema. Lymphatics: see Neck Exam Skin: No concerning lesions. Musculoskeletal: symmetric strength and muscle tone throughout. Neurologic: Cranial nerves II through XII are grossly intact. No obvious focalities. Speech is fluent. Coordination is intact. Psychiatric: Judgment and insight are intact. Affect is appropriate.  ECOG = 1  LABORATORY DATA:  Lab Results  Component Value Date   WBC 7.9 02/09/2016   HGB 11.3* 02/09/2016   HCT 34.6* 02/09/2016   MCV 100.0 02/09/2016   PLT 358 02/09/2016   NEUTROABS 4.5 02/09/2016   Lab Results  Component Value Date   NA 143 02/09/2016   K 4.8 02/09/2016   CL 98* 01/23/2016   CO2 28 02/09/2016   GLUCOSE 121 02/09/2016   CREATININE 1.2* 02/09/2016   CALCIUM 10.0 02/09/2016      RADIOGRAPHY: Dg Chest 2 View  02/09/2016  CLINICAL DATA:  Lung cancer.  Recent surgery. EXAM: CHEST - 2 VIEW COMPARISON:  Two-view chest x-ray 01/23/2016. FINDINGS: The heart size is normal. The previously seen right-sided pneumothorax and subcutaneous emphysema has resolved. There is volume loss at the right apex with tenting of the right hemidiaphragm compatible  with a resection. No focal airspace disease caudal edema, or effusion is present. The visualized soft tissues and bony thorax are unremarkable. Atherosclerotic calcifications are present at the aorta. IMPRESSION: 1. Interval resolution of right-sided pneumothorax and subcutaneous emphysema. 2. Status post right upper lobectomy. 3. No acute cardiopulmonary disease. 4. Atherosclerosis of the thoracic aorta. Electronically Signed   By: San Morelle M.D.   On: 02/09/2016 09:10   Dg Chest 2 View  01/23/2016  CLINICAL DATA:  Follow-up pneumothorax EXAM: CHEST  2 VIEW COMPARISON:  Chest radiograph from one day prior. FINDINGS: Surgical hardware from [facility] overlies the lower cervical spine. Stable cardiomediastinal silhouette with normal heart size. Stable small right apical pneumothorax, approximately 5-10%. No left pneumothorax. Trace bilateral pleural effusions. Stable subcutaneous emphysema in the right neck and right chest wall. No pulmonary edema. Surgical sutures and clips overlie the right hilum. Stable mild bibasilar atelectasis. IMPRESSION: 1. Stable small right apical pneumothorax. 2. Stable mild  bibasilar atelectasis. 3. Trace bilateral pleural effusions. Electronically Signed   By: Ilona Sorrel M.D.   On: 01/23/2016 07:57   Dg Chest 2 View  01/16/2016  CLINICAL DATA:  Lung mass. EXAM: CHEST  2 VIEW COMPARISON:  CT 12/13/2015. FINDINGS: Right suprahilar mass again noted. Lungs are otherwise clear. Heart size normal. Prior cervical spine fusion. IMPRESSION: Right suprahilar mass again noted. Similar findings noted on prior CT of 12/13/2015. Electronically Signed   By: Marcello Moores  Register   On: 01/16/2016 10:26   Dg Chest Port 1 View  01/22/2016  CLINICAL DATA:  Chest tube EXAM: PORTABLE CHEST 1 VIEW COMPARISON:  01/21/2016 FINDINGS: Right apical chest tube. Associated small right apical pneumothorax, slightly increased. Mild subcutaneous emphysema. Mild patchy bilateral lower lobe opacities, likely  atelectasis. Right IJ venous catheter in the lower SVC. Cardiomegaly. Cervical spine fixation hardware. IMPRESSION: Right apical chest tube. Associated small right apical pneumothorax, slightly increased. Mild subcutaneous emphysema. Electronically Signed   By: Julian Hy M.D.   On: 01/22/2016 08:15   Dg Chest Port 1 View  01/21/2016  CLINICAL DATA:  Chest tube in place.  Shortness of breath. EXAM: PORTABLE CHEST - 1 VIEW COMPARISON:  One-view chest x-ray 01/20/2016. FINDINGS: One of the chest tubes has been removed. A small right-sided pneumothorax is not significantly changed. There is slight increase in size of a small pneumothorax measuring 10-15% of the right hemi thorax. One of the chest tubes remains in place. A right IJ line is in place. There is increased subcutaneous emphysema over the right chest. The heart is enlarged. Increasing left basilar airspace disease is noted. IMPRESSION: 1. Slight increase in size of small right-sided pneumothorax. 2. Interval removal of 1 of the 2 right-sided chest tubes. 3. Increased subcutaneous emphysema. 4. Increasing left basilar airspace disease likely reflects atelectasis. Electronically Signed   By: San Morelle M.D.   On: 01/21/2016 08:31   Dg Chest Port 1 View  01/20/2016  CLINICAL DATA:  Chest tube in place.  Cough and congestion today. EXAM: PORTABLE CHEST 1 VIEW COMPARISON:  01/19/2016 FINDINGS: Two right chest tubes and right central venous catheter remain unchanged in position. Tiny residual right apical pneumothorax similar previous study. No developing consolidation or atelectasis in the lungs. No blunting of costophrenic angles. No pneumothorax. Heart size and pulmonary vascularity are normal. Calcification of the aorta. Postoperative changes in the cervical spine. IMPRESSION: No change since prior study. Appliances remain unchanged in position. Small residual right apical pneumothorax similar to prior study. Electronically Signed   By:  Lucienne Capers M.D.   On: 01/20/2016 02:00   Dg Chest Port 1 View  01/19/2016  CLINICAL DATA:  Right lung surgery.  Chest tube. EXAM: PORTABLE CHEST 1 VIEW COMPARISON:  01/18/2016. FINDINGS: Right IJ line and 2 right chest tubes in stable position. Near complete resolution of right pneumothorax with tiny apical residual. Stable cardiomegaly. Postsurgical changes right lung. Lung volumes with basilar atelectasis. Interim improvement small left pleural effusion. Prior cervical spine fusion . IMPRESSION: 1. Right IJ line and 2 right chest tubes in stable position. Near complete resolution of right pneumothorax with tiny apical residual. Postsurgical changes right lung. 2. Low lung volumes with mild bibasilar atelectasis. Interim improvement small left pleural effusion. 3. Stable cardiomegaly. Electronically Signed   By: Marcello Moores  Register   On: 01/19/2016 07:31   Dg Chest Port 1 View  01/18/2016  CLINICAL DATA:  Post RIGHT thoracotomy, RIGHT lung resection, and chest tube placement EXAM: PORTABLE CHEST  1 VIEW COMPARISON:  Portable exam 1424 hours compared to 01/16/2016 FINDINGS: RIGHT jugular central venous catheter with tip projecting over SVC. Pair of RIGHT thoracostomy tubes newly identified. Enlargement of cardiac silhouette with pulmonary vascular congestion. Question mild LEFT perihilar infiltrate versus edema. Minimal RIGHT basilar atelectasis without pleural effusion. Small RIGHT apex pneumothorax identified. LEFT basilar atelectasis and questionably small effusion present. Atherosclerotic calcification aorta. Prior cervical spine fusion. IMPRESSION: Postoperative changes RIGHT hemi thorax with small RIGHT apex pneumothorax despite presence of 2 thoracostomy tubes. Enlargement of cardiac silhouette with pulmonary vascular congestion and question minimal LEFT perihilar infiltrate versus edema. Electronically Signed   By: Lavonia Dana M.D.   On: 01/18/2016 14:37      IMPRESSION: The patient has been  diagnosed pathologic stage III (ypT2a, pN2, M0) non-small cell lung cancer, squamous cell carcinoma. At time of surgery she was found to have hilar and mediastinal lymphadenopathy. She would be at risk for recurrence in the chest and would benefit from postoperative radiation therapy. I also recommend radiosensitizing chemotherapy along with her chest treatments. We discussed that radiation therapy has been shown to reduce the chance of recurrence within the chest but no obvious survival benefit.  PLAN:We discussed the possible side effects and risks of treatment in addition to the possible benefits of treatment. We discussed the protocol for radiation treatment.  All of the patient's questions were answered. The patient does wish to proceed with this treatment. A simulation will be scheduled such that we can proceed with treatment planning.  I anticipate concurrent treatment to begin on April 17th. I plan for simulation a week prior to that time.      ------------------------------------------------  Blair Promise, PhD, MD   This document serves as a record of services personally performed by Gery Pray, MD. It was created on his behalf by Derek Mound, a trained medical scribe. The creation of this record is based on the scribe's personal observations and the provider's statements to them. This document has been checked and approved by the attending provider.

## 2016-02-09 NOTE — Telephone Encounter (Signed)
Gave and printed appt shced and avs for pt for April and May

## 2016-02-09 NOTE — Progress Notes (Signed)
[facility] Clinical Social Work  Clinical Social Work met with patient/family and Futures trader at [facility] appointment to offer support and assess for psychosocial needs.  Medical oncologist reviewed patient's diagnosis and recommended treatment plan for additional chemo and radiation after surgery.  The patient was accompanied by her son.  She reported having 4 children, 3 of which who live across the street from her. Ms. Trentham shared she is ready to "get back to a routine", but understands importance of additional treatment.    Clinical Social Work briefly discussed Clinical Social Work role and [employer] support programs/services.  Clinical Social Work encouraged patient to call with any additional questions or concerns.   Polo Riley, MSW, LCSW, OSW-C Clinical Social Worker [facility] [phone]

## 2016-02-09 NOTE — Patient Outreach (Signed)
[facility]) Care Management  02/09/2016  DANDRIA GRIEGO 1940/11/30 [id number]   Transition of care  RN spoke with pt today who indicates she is doing well and had a visit with Dr. Servando Snare this morning and another pending appointment with her oncologist later today. Pt states her recent follow up with her surgeon indicates sheis doing well. Pt reports taking all her prescribed medications and she is receiving [facility] PT twice weekly with learning how to exercise and improving her breathing. Pt feels this is benificenial to her at this time. No major issues or acute events have taken place and based upon the involved services pt continues to decline [facility] for community case management. Pt remains receptive to one addition follow up call for ongoing transition of care for next Wednesday. Will follow up accordingly.   Raina Mina, RN Care Management Coordinator [place] Office [phone]

## 2016-02-09 NOTE — Progress Notes (Signed)
[facility] [phone]   Fax:([phone]) [place], MD [address] [place] [address]  DIAGNOSIS: Stage IIIA (T2a, N2, M0) non-small cell lung cancer, squamous cell carcinoma presented with large right upper lobe lung mass with right hilar involvement diagnosed in November 2016.  PRIOR THERAPY:  1) Neoadjuvant systemic chemotherapy with carboplatin for AUC of 6 and paclitaxel 200 MG/M2 every 3 weeks, status post 3 cycles with partial response. 2) Video bronchoscopy, right video-assisted thoracoscopy, minithoracotomy, right upper lobectomy with lymph node dissection on 01/18/2016. The final pathology revealed residual squamous cell carcinoma measuring 3.1 cm with close resection margin and metastatic carcinoma to lymph nodes at levels 10R, 12R and 4R. (ypT2a, yN2).   CURRENT THERAPY: Concurrent chemoradiation with weekly carboplatin for AUC of 2 and paclitaxel 45 MG/M2. First dose 02/27/2016.  INTERVAL HISTORY: Tammy Ball 75 y.o. female returns to the clinic today for follow-up visit accompanied by her son and daughter. The patient tolerated the last cycle of her systemic chemotherapy fairly well. She recently underwent right upper lobectomy with lymph node dissection and the final pathology revealed residual squamous cell carcinoma measuring 3.1 cm was close resection margin and metastatic lymph nodes to the right hilar and mediastinal lymphadenopathy. She is recovering well from her surgery except for soreness on the right side of the chest at the surgical scar. She continues to have mild fatigue. She denied having any significant weight loss or night sweats. She denied having any nausea or vomiting, no fever or chills. She has no significant shortness of breath, cough or hemoptysis. She is here today for evaluation and discussion of her treatment options.  MEDICAL HISTORY: Past Medical History  Diagnosis Date  . COPD  (chronic obstructive pulmonary disease) ([place])   . Hypertension   . Depression   . Asthma   . Glaucoma   . Hyperlipidemia   . Heart murmur   . Non-small cell carcinoma of right lung, stage 2 ([place]) 10/03/2015  . Chemotherapy-induced neuropathy ([place]) 11/01/2015  . History of nuclear stress test     Myoview 2/17: no ischemia or scar, EF 79%; low risk  . History of echocardiogram     Echo 2/17: EF 96-29%, grade 1 diastolic dysfunction, mild MR, trivial pericardial effusion  . Renal cell carcinoma ([place])     L nephrectomy  in 2012  . Anxiety   . Claustrophobia   . Headache     prior to menopause  . Anemia     was while doing chemo    ALLERGIES:  is allergic to bee venom; amlodipine; levofloxacin; and hctz.  MEDICATIONS:  Current Outpatient Prescriptions  Medication Sig Dispense Refill  . acetaminophen (TYLENOL) 500 MG tablet Take 1,000 mg by mouth every 6 (six) hours as needed for mild pain, moderate pain, fever or headache.    . albuterol (PROVENTIL HFA;VENTOLIN HFA) 108 (90 BASE) MCG/ACT inhaler Inhale 2 puffs into the lungs every 4 (four) hours as needed for wheezing or shortness of breath. 1 Inhaler 0  . aspirin EC 81 MG tablet Take 81 mg by mouth daily.    Marland Kitchen atorvastatin (LIPITOR) 40 MG tablet TAKE ONE TABLET BY MOUTH ONCE DAILY. (Patient taking differently: Take 40 mg by mouth daily. ) 90 tablet 3  . benzonatate (TESSALON) 200 MG capsule Take 1 capsule (200 mg total) by mouth 3 (three) times daily as needed for cough. (Patient not taking: Reported on 01/06/2016) 20 capsule 0  .  CALCIUM PO Take 1 tablet by mouth daily.    . Cholecalciferol (VITAMIN D-3 PO) Take 1 tablet by mouth daily.    . fish oil-omega-3 fatty acids 1000 MG capsule Take 1 capsule by mouth daily.     . fluticasone (FLONASE) 50 MCG/ACT nasal spray Place 2 sprays into both nostrils daily. 9.9 g 0  . LORazepam (ATIVAN) 0.5 MG tablet Take 1 tablet (0.5 mg total) by mouth 2 (two) times daily as needed for anxiety. 10  tablet 1  . mometasone-formoterol (DULERA) 200-5 MCG/ACT AERO Inhale 2 puffs into the lungs 2 (two) times daily. 3 Inhaler 0  . oxyCODONE-acetaminophen (PERCOCET/ROXICET) 5-325 MG tablet Take 1-2 tablets by mouth every 6 (six) hours as needed for severe pain. 30 tablet 0  . polyvinyl alcohol-povidone (REFRESH) 1.4-0.6 % ophthalmic solution Place 2 drops into both eyes daily as needed (for dry eyes).    . traMADol (ULTRAM) 50 MG tablet TAKE 1-2 TABLETS ( 50-100 MG TOTAL) BY MOUTH EVERY 6 (SIX) HOURS  AS NEEDED FOR PAIN    . UNABLE TO FIND Med Name: Wig 1 each 0  . valsartan (DIOVAN) 80 MG tablet Take 1 tablet (80 mg total) by mouth daily. 90 tablet 0   No current facility-administered medications for this visit.    SURGICAL HISTORY:  Past Surgical History  Procedure Laterality Date  . Nephrectomy    . Kidney cancer    . Tubal ligation    . Spine surgery    . Eye surgery    . Video bronchoscopy Bilateral 09/20/2015    Procedure: VIDEO BRONCHOSCOPY WITHOUT FLUORO;  Surgeon: Rigoberto Noel, MD;  Location: [facility];  Service: Cardiopulmonary;  Laterality: Bilateral;  . Back surgery      cervical 1991  . Video bronchoscopy N/A 01/18/2016    Procedure: VIDEO BRONCHOSCOPY;  Surgeon: [name], MD;  Location: [facility];  Service: Thoracic;  Laterality: N/A;  . Video assisted thoracoscopy (vats)/wedge resection Right 01/18/2016    Procedure: VIDEO ASSISTED THORACOSCOPY (VATS)/LUNG RESECTION, THOROCOTOMY, RIGHT UPPER LOBECTOMY, LYMPH NODE DISSECTION, PLACEMENT OF ON Q;  Surgeon: [name], MD;  Location: [facility];  Service: Thoracic;  Laterality: Right;    REVIEW OF SYSTEMS:  Constitutional: positive for fatigue Eyes: negative Ears, nose, mouth, throat, and face: negative Respiratory: positive for pleurisy/chest pain Cardiovascular: negative Gastrointestinal: negative Genitourinary:negative Integument/breast: negative Hematologic/lymphatic:  negative Musculoskeletal:negative Neurological: positive for paresthesia Behavioral/Psych: negative Endocrine: negative Allergic/Immunologic: negative   PHYSICAL EXAMINATION: General appearance: alert, cooperative, fatigued and no distress Head: Normocephalic, without obvious abnormality, atraumatic Neck: no adenopathy, no JVD, supple, symmetrical, trachea midline and thyroid not enlarged, symmetric, no tenderness/mass/nodules Lymph nodes: Cervical, supraclavicular, and axillary nodes normal. Resp: clear to auscultation bilaterally Back: symmetric, no curvature. ROM normal. No CVA tenderness. Cardio: regular rate and rhythm, S1, S2 normal, no murmur, click, rub or gallop GI: soft, non-tender; bowel sounds normal; no masses,  no organomegaly Extremities: extremities normal, atraumatic, no cyanosis or edema Neurologic: Alert and oriented X 3, normal strength and tone. Normal symmetric reflexes. Normal coordination and gait  ECOG PERFORMANCE STATUS: 1 - Symptomatic but completely ambulatory  Blood pressure 140/54, pulse 85, temperature 98.3 F (36.8 C), temperature source Oral, resp. rate 18, height '5\' 4"'$  (1.626 m), weight 176 lb 1.6 oz (79.878 kg), SpO2 98 %.  LABORATORY DATA: Lab Results  Component Value Date   WBC 7.9 02/09/2016   HGB 11.3* 02/09/2016   HCT 34.6* 02/09/2016   MCV 100.0 02/09/2016  PLT 358 02/09/2016      Chemistry      Component Value Date/Time   NA 137 01/23/2016 0301   NA 139 12/06/2015 0928   K 4.5 01/23/2016 0301   K 5.3* 12/06/2015 0928   CL 98* 01/23/2016 0301   CO2 33* 01/23/2016 0301   CO2 25 12/06/2015 0928   BUN 20 01/23/2016 0301   BUN 24.2 12/06/2015 0928   BUN 24* 12/11/2011   CREATININE 1.03* 01/23/2016 0301   CREATININE 1.0 12/06/2015 0928   CREATININE 1.08 12/02/2013 0911   GLU 105 12/10/2009      Component Value Date/Time   CALCIUM 9.1 01/23/2016 0301   CALCIUM 9.5 12/06/2015 0928   ALKPHOS 61 01/20/2016 0425   ALKPHOS 136  12/06/2015 0928   AST 22 01/20/2016 0425   AST 19 12/06/2015 0928   ALT 15 01/20/2016 0425   ALT 25 12/06/2015 0928   BILITOT 0.3 01/20/2016 0425   BILITOT <0.30 12/06/2015 0928       RADIOGRAPHIC STUDIES: Dg Chest 2 View  02/09/2016  CLINICAL DATA:  Lung cancer.  Recent surgery. EXAM: CHEST - 2 VIEW COMPARISON:  Two-view chest x-ray 01/23/2016. FINDINGS: The heart size is normal. The previously seen right-sided pneumothorax and subcutaneous emphysema has resolved. There is volume loss at the right apex with tenting of the right hemidiaphragm compatible with a resection. No focal airspace disease caudal edema, or effusion is present. The visualized soft tissues and bony thorax are unremarkable. Atherosclerotic calcifications are present at the aorta. IMPRESSION: 1. Interval resolution of right-sided pneumothorax and subcutaneous emphysema. 2. Status post right upper lobectomy. 3. No acute cardiopulmonary disease. 4. Atherosclerosis of the thoracic aorta. Electronically Signed   By: San Morelle M.D.   On: 02/09/2016 09:10   Dg Chest 2 View  01/23/2016  CLINICAL DATA:  Follow-up pneumothorax EXAM: CHEST  2 VIEW COMPARISON:  Chest radiograph from one day prior. FINDINGS: Surgical hardware from [facility] overlies the lower cervical spine. Stable cardiomediastinal silhouette with normal heart size. Stable small right apical pneumothorax, approximately 5-10%. No left pneumothorax. Trace bilateral pleural effusions. Stable subcutaneous emphysema in the right neck and right chest wall. No pulmonary edema. Surgical sutures and clips overlie the right hilum. Stable mild bibasilar atelectasis. IMPRESSION: 1. Stable small right apical pneumothorax. 2. Stable mild bibasilar atelectasis. 3. Trace bilateral pleural effusions. Electronically Signed   By: Ilona Sorrel M.D.   On: 01/23/2016 07:57   Dg Chest 2 View  01/16/2016  CLINICAL DATA:  Lung mass. EXAM: CHEST  2 VIEW COMPARISON:  CT 12/13/2015. FINDINGS:  Right suprahilar mass again noted. Lungs are otherwise clear. Heart size normal. Prior cervical spine fusion. IMPRESSION: Right suprahilar mass again noted. Similar findings noted on prior CT of 12/13/2015. Electronically Signed   By: Marcello Moores  Register   On: 01/16/2016 10:26   Dg Chest Port 1 View  01/22/2016  CLINICAL DATA:  Chest tube EXAM: PORTABLE CHEST 1 VIEW COMPARISON:  01/21/2016 FINDINGS: Right apical chest tube. Associated small right apical pneumothorax, slightly increased. Mild subcutaneous emphysema. Mild patchy bilateral lower lobe opacities, likely atelectasis. Right IJ venous catheter in the lower SVC. Cardiomegaly. Cervical spine fixation hardware. IMPRESSION: Right apical chest tube. Associated small right apical pneumothorax, slightly increased. Mild subcutaneous emphysema. Electronically Signed   By: Julian Hy M.D.   On: 01/22/2016 08:15   Dg Chest Port 1 View  01/21/2016  CLINICAL DATA:  Chest tube in place.  Shortness of breath. EXAM: PORTABLE CHEST - 1  VIEW COMPARISON:  One-view chest x-ray 01/20/2016. FINDINGS: One of the chest tubes has been removed. A small right-sided pneumothorax is not significantly changed. There is slight increase in size of a small pneumothorax measuring 10-15% of the right hemi thorax. One of the chest tubes remains in place. A right IJ line is in place. There is increased subcutaneous emphysema over the right chest. The heart is enlarged. Increasing left basilar airspace disease is noted. IMPRESSION: 1. Slight increase in size of small right-sided pneumothorax. 2. Interval removal of 1 of the 2 right-sided chest tubes. 3. Increased subcutaneous emphysema. 4. Increasing left basilar airspace disease likely reflects atelectasis. Electronically Signed   By: San Morelle M.D.   On: 01/21/2016 08:31   Dg Chest Port 1 View  01/20/2016  CLINICAL DATA:  Chest tube in place.  Cough and congestion today. EXAM: PORTABLE CHEST 1 VIEW COMPARISON:   01/19/2016 FINDINGS: Two right chest tubes and right central venous catheter remain unchanged in position. Tiny residual right apical pneumothorax similar previous study. No developing consolidation or atelectasis in the lungs. No blunting of costophrenic angles. No pneumothorax. Heart size and pulmonary vascularity are normal. Calcification of the aorta. Postoperative changes in the cervical spine. IMPRESSION: No change since prior study. Appliances remain unchanged in position. Small residual right apical pneumothorax similar to prior study. Electronically Signed   By: Lucienne Capers M.D.   On: 01/20/2016 02:00   Dg Chest Port 1 View  01/19/2016  CLINICAL DATA:  Right lung surgery.  Chest tube. EXAM: PORTABLE CHEST 1 VIEW COMPARISON:  01/18/2016. FINDINGS: Right IJ line and 2 right chest tubes in stable position. Near complete resolution of right pneumothorax with tiny apical residual. Stable cardiomegaly. Postsurgical changes right lung. Lung volumes with basilar atelectasis. Interim improvement small left pleural effusion. Prior cervical spine fusion . IMPRESSION: 1. Right IJ line and 2 right chest tubes in stable position. Near complete resolution of right pneumothorax with tiny apical residual. Postsurgical changes right lung. 2. Low lung volumes with mild bibasilar atelectasis. Interim improvement small left pleural effusion. 3. Stable cardiomegaly. Electronically Signed   By: Marcello Moores  Register   On: 01/19/2016 07:31   Dg Chest Port 1 View  01/18/2016  CLINICAL DATA:  Post RIGHT thoracotomy, RIGHT lung resection, and chest tube placement EXAM: PORTABLE CHEST 1 VIEW COMPARISON:  Portable exam 1424 hours compared to 01/16/2016 FINDINGS: RIGHT jugular central venous catheter with tip projecting over SVC. Pair of RIGHT thoracostomy tubes newly identified. Enlargement of cardiac silhouette with pulmonary vascular congestion. Question mild LEFT perihilar infiltrate versus edema. Minimal RIGHT basilar  atelectasis without pleural effusion. Small RIGHT apex pneumothorax identified. LEFT basilar atelectasis and questionably small effusion present. Atherosclerotic calcification aorta. Prior cervical spine fusion. IMPRESSION: Postoperative changes RIGHT hemi thorax with small RIGHT apex pneumothorax despite presence of 2 thoracostomy tubes. Enlargement of cardiac silhouette with pulmonary vascular congestion and question minimal LEFT perihilar infiltrate versus edema. Electronically Signed   By: Lavonia Dana M.D.   On: 01/18/2016 14:37    ASSESSMENT AND PLAN: This is a very pleasant 75 years old white female recently diagnosed with stage II a non-small cell lung cancer, squamous cell carcinoma and completed neoadjuvant systemic chemotherapy with carboplatin and paclitaxel status post 3 cycles.  The recent CT scan of the chest showed partial response response with significant decrease in the size of the central right upper lobe pulmonary nodule and increased cavitation.  The patient underwent right upper lobectomy with lymph node dissection that showed  residual tumor in addition to metastatic disease to the mediastinal lymph nodes and close resection margin. I had a lengthy discussion with the patient and her son today about her current disease stage, prognosis and treatment options. I recommended for the patient a course of concurrent chemoradiation with weekly carboplatin for AUC of 2 and paclitaxel 45 MG/M2. I discussed with the patient adverse effect of the chemotherapy including but not limited to alopecia, myelosuppression, nausea and vomiting, peripheral neuropathy, liver or renal dysfunction. She is expected to start the first cycle of her concurrent chemoradiation on 02/27/2016. The patient will be seen by Dr. Sondra Come later today for evaluation and discussion of the radiotherapy option. I gave the patient and her family the time to ask questions and I answered them completely to their  satisfaction. She was advised to call immediately if she has any concerning symptoms in the interval. The patient voices understanding of current disease status and treatment options and is in agreement with the current care plan.  All questions were answered. The patient knows to call the clinic with any problems, questions or concerns. We can certainly see the patient much sooner if necessary.  Disclaimer: This note was dictated with voice recognition software. Similar sounding words can inadvertently be transcribed and may not be corrected upon review.

## 2016-02-09 NOTE — Progress Notes (Signed)
[address]       [place] [address]             [phone]      Tammy Ball [place] Medical Record #[id number] Date of Birth: Mar 15, 1941  Referring: Tammy Noel, MD Primary Care: Tammy Haber, MD  Chief Complaint:   POST OP FOLLOW UP 01/18/2016 OPERATIVE REPORT PREOPERATIVE DIAGNOSIS: Right upper lobe non-small cell carcinoma of he lung, status post chemotherapy treatment. POSTOPERATIVE DIAGNOSIS: Right upper lobe non-small cell carcinoma of the lung, status post chemotherapy treatment. PROCEDURES: Video bronchoscopy, right video-assisted thoracoscopy, mini thoracotomy, right upper lobectomy with lymph node dissection and placement of On-Q device. SURGEON: Tammy Bal, MD.  Non-small cell carcinoma of right lung, stage 2 [facility])   Staging form: Lung, AJCC 7th Edition     Clinical stage from 10/03/2015: Stage IIA (T2a, N1, M0) - Signed by Tammy Bears, MD on 10/03/2015     Pathologic stage from 01/20/2016: Stage IIIA (yT2a, N2, cM0) - Signed by Tammy Isaac, MD on 01/20/2016 Renal cell cancer [facility])   Staging form: Kidney, AJCC 7th Edition     Clinical: No stage assigned - Unsigned  History of Present Illness:     Tammy Ball 75 y.o. female has been followed in the office for diagnosis of Lung Cancer., she  completed 3 cycles of chemo for Squamous cell with large right upper lobe mass diagnosed nov 2016 She has a history h/o copd gold II , frequent sinus infections, hypertension, She present to ER in Nov with 2 days of fevers and chills, sob and cough. On arrival to ED, she was febrile, tachycardic, hypotensive, hypoxic and witht elevated lactic acid.  She was found to have pneumonia on CXR. She was referred to medical service for sepsis from CAP. Bronchoscopy by Dr Tammy Ball revealed whitish endobrochial mass seen blocking entry of RUl bronchus. She was diagnosed with clinical stage IIa squamous cell carcinoma and underwent  3 cycles of chemotherapy with significant response. On March 8 she underwent right upper lobectomy and node dissection. She now returns for follow-up visit She's made excellent progress postoperatively in fact is planning to return to work today. She is increasing her activity appropriately. She's had no fever or chills. Does complain of some fatigue and trouble sleeping   DIAGNOSIS: clinicial  Stage IIA (T2a, N1, M0) non-small cell lung cancer, squamous cell carcinoma presented with large right upper lobe lung mass with right hilar involvement diagnosed in November 2016. Post  resection  pathologic Stage IIIA   PRIOR THERAPY: Neoadjuvant systemic chemotherapy with carboplatin for AUC of 6 and paclitaxel 200 MG/M2 every 3 weeks, status post 3 cycles with partial response., Right upper lobectomy with node dissection March 8 when he 56       Past Medical History  Diagnosis Date  . COPD (chronic obstructive pulmonary disease) ([place])   . Hypertension   . Depression   . Asthma   . Glaucoma   . Hyperlipidemia   . Heart murmur   . Non-small cell carcinoma of right lung, stage 2 ([place]) 10/03/2015  . Chemotherapy-induced neuropathy ([place]) 11/01/2015  . History of nuclear stress test     Myoview 2/17: no ischemia or scar, EF 79%; low risk  . History of echocardiogram     Echo 2/17: EF 46-56%, grade 1 diastolic dysfunction, mild MR, trivial pericardial effusion  . Renal cell carcinoma ([place])     L nephrectomy  in 2012  .  Anxiety   . Claustrophobia   . Headache     prior to menopause  . Anemia     was while doing chemo     History  Smoking status  . Former Smoker -- 0.50 packs/day for 50 years  . Types: Cigarettes  . Quit date: 03/16/2011  Smokeless tobacco  . Never Used    History  Alcohol Use No     Allergies  Allergen Reactions  . Bee Venom Anaphylaxis, Shortness Of Breath, Swelling and Other (See Comments)    Swelling at site   . Amlodipine Swelling and Other (See  Comments)    Swelling of the ankles and hands   . Levofloxacin Other (See Comments)    Joint pain   . Hctz [Hydrochlorothiazide] Palpitations and Other (See Comments)    Sweating     Current Outpatient Prescriptions  Medication Sig Dispense Refill  . acetaminophen (TYLENOL) 500 MG tablet Take 1,000 mg by mouth every 6 (six) hours as needed for mild pain, moderate pain, fever or headache.    . albuterol (PROVENTIL HFA;VENTOLIN HFA) 108 (90 BASE) MCG/ACT inhaler Inhale 2 puffs into the lungs every 4 (four) hours as needed for wheezing or shortness of breath. 1 Inhaler 0  . aspirin EC 81 MG tablet Take 81 mg by mouth daily.    Marland Kitchen atorvastatin (LIPITOR) 40 MG tablet TAKE ONE TABLET BY MOUTH ONCE DAILY. (Patient taking differently: Take 40 mg by mouth daily. ) 90 tablet 3  . CALCIUM PO Take 1 tablet by mouth daily.    . Cholecalciferol (VITAMIN D-3 PO) Take 1 tablet by mouth daily.    . fish oil-omega-3 fatty acids 1000 MG capsule Take 1 capsule by mouth daily.     . fluticasone (FLONASE) 50 MCG/ACT nasal spray Place 2 sprays into both nostrils daily. 9.9 g 0  . LORazepam (ATIVAN) 0.5 MG tablet Take 1 tablet (0.5 mg total) by mouth 2 (two) times daily as needed for anxiety. 10 tablet 1  . mometasone-formoterol (DULERA) 200-5 MCG/ACT AERO Inhale 2 puffs into the lungs 2 (two) times daily. 3 Inhaler 0  . oxyCODONE-acetaminophen (PERCOCET/ROXICET) 5-325 MG tablet Take 1-2 tablets by mouth every 6 (six) hours as needed for severe pain. 30 tablet 0  . polyvinyl alcohol-povidone (REFRESH) 1.4-0.6 % ophthalmic solution Place 2 drops into both eyes daily as needed (for dry eyes).    . traMADol (ULTRAM) 50 MG tablet TAKE 1-2 TABLETS ( 50-100 MG TOTAL) BY MOUTH EVERY 6 (SIX) HOURS  AS NEEDED FOR PAIN    . UNABLE TO FIND Med Name: Wig 1 each 0  . valsartan (DIOVAN) 80 MG tablet Take 1 tablet (80 mg total) by mouth daily. 90 tablet 0  . benzonatate (TESSALON) 200 MG capsule Take 1 capsule (200 mg total) by  mouth 3 (three) times daily as needed for cough. (Patient not taking: Reported on 01/06/2016) 20 capsule 0   No current facility-administered medications for this visit.       Physical Exam: BP 136/79 mmHg  Pulse 85  Resp 16  Ht '5\' 4"'$  (1.626 m)  Wt 175 lb 9.6 oz (79.652 kg)  BMI 30.13 kg/m2  SpO2 96%  General appearance: alert and cooperative Neurologic: intact Heart: regular rate and rhythm, S1, S2 normal, no murmur, click, rub or gallop Lungs: clear to auscultation bilaterally Abdomen: soft, non-tender; bowel sounds normal; no masses,  no organomegaly Extremities: extremities normal, atraumatic, no cyanosis or edema Wound: Right chest incision and port sites  are healing well   Diagnostic Studies & Laboratory data:     Recent Radiology Findings:   Dg Chest 2 View  02/09/2016  CLINICAL DATA:  Lung cancer.  Recent surgery. EXAM: CHEST - 2 VIEW COMPARISON:  Two-view chest x-ray 01/23/2016. FINDINGS: The heart size is normal. The previously seen right-sided pneumothorax and subcutaneous emphysema has resolved. There is volume loss at the right apex with tenting of the right hemidiaphragm compatible with a resection. No focal airspace disease caudal edema, or effusion is present. The visualized soft tissues and bony thorax are unremarkable. Atherosclerotic calcifications are present at the aorta. IMPRESSION: 1. Interval resolution of right-sided pneumothorax and subcutaneous emphysema. 2. Status post right upper lobectomy. 3. No acute cardiopulmonary disease. 4. Atherosclerosis of the thoracic aorta. Electronically Signed   By: San Morelle M.D.   On: 02/09/2016 09:10      Recent Lab Findings: Lab Results  Component Value Date   WBC 6.8 01/23/2016   HGB 9.1* 01/23/2016   HCT 29.8* 01/23/2016   PLT 262 01/23/2016   GLUCOSE 94 01/23/2016   CHOL 174 12/10/2015   TRIG 150* 12/10/2015   HDL 89 12/10/2015   LDLCALC 55 12/10/2015   ALT 15 01/20/2016   AST 22 01/20/2016    NA 137 01/23/2016   K 4.5 01/23/2016   CL 98* 01/23/2016   CREATININE 1.03* 01/23/2016   BUN 20 01/23/2016   CO2 33* 01/23/2016   TSH 1.583 12/14/2011   INR 1.00 01/16/2016      Assessment / Plan:      Patient doing well postoperatively following right upper lobectomy and node dissection for pathologic stage IIIa carcinoma previously treated with 3 rounds of chemotherapy., Findings were discussed in the [place] with consideration for postoperative chemoradiation. We'll plan to see the patient back in 3 weeks with a follow-up chest x-ray.  Tammy Isaac MD      [address] , [address] Office [phone]   Beeper [phone]  02/09/2016 9:49 AM

## 2016-02-10 ENCOUNTER — Encounter: Payer: Self-pay | Admitting: *Deleted

## 2016-02-10 NOTE — Progress Notes (Signed)
Oncology Nurse Navigator Documentation  Oncology Nurse Navigator Flowsheets 02/10/2016  Navigator Location [facility] On[facility]  Navigator Encounter Type Clinic/MDC  Patient Visit Type MedOnc  Treatment Phase Treatment  Barriers/Navigation Needs Education  Education Other  Acuity Level 1  Acuity Level 1 Minimal follow up required  Time Spent with Patient 48   Spoke with patient and son yesterday at [facility].  Ms. Eckhart has been receiving tx here at [facility] and recently had surgery.  She will have follow up chemo and radiation therapy.  Gave and explained information on next steps.

## 2016-02-13 DIAGNOSIS — G620 Drug-induced polyneuropathy: Secondary | ICD-10-CM | POA: Diagnosis not present

## 2016-02-13 DIAGNOSIS — C3411 Malignant neoplasm of upper lobe, right bronchus or lung: Secondary | ICD-10-CM | POA: Diagnosis not present

## 2016-02-13 DIAGNOSIS — I10 Essential (primary) hypertension: Secondary | ICD-10-CM | POA: Diagnosis not present

## 2016-02-13 DIAGNOSIS — T451X5D Adverse effect of antineoplastic and immunosuppressive drugs, subsequent encounter: Secondary | ICD-10-CM | POA: Diagnosis not present

## 2016-02-13 DIAGNOSIS — J449 Chronic obstructive pulmonary disease, unspecified: Secondary | ICD-10-CM | POA: Diagnosis not present

## 2016-02-13 DIAGNOSIS — Z483 Aftercare following surgery for neoplasm: Secondary | ICD-10-CM | POA: Diagnosis not present

## 2016-02-15 ENCOUNTER — Encounter: Payer: Self-pay | Admitting: *Deleted

## 2016-02-15 ENCOUNTER — Other Ambulatory Visit: Payer: Self-pay | Admitting: *Deleted

## 2016-02-15 ENCOUNTER — Other Ambulatory Visit: Payer: Self-pay | Admitting: Family Medicine

## 2016-02-15 DIAGNOSIS — J449 Chronic obstructive pulmonary disease, unspecified: Secondary | ICD-10-CM | POA: Diagnosis not present

## 2016-02-15 DIAGNOSIS — G620 Drug-induced polyneuropathy: Secondary | ICD-10-CM | POA: Diagnosis not present

## 2016-02-15 DIAGNOSIS — T451X5D Adverse effect of antineoplastic and immunosuppressive drugs, subsequent encounter: Secondary | ICD-10-CM | POA: Diagnosis not present

## 2016-02-15 DIAGNOSIS — I10 Essential (primary) hypertension: Secondary | ICD-10-CM | POA: Diagnosis not present

## 2016-02-15 DIAGNOSIS — Z483 Aftercare following surgery for neoplasm: Secondary | ICD-10-CM | POA: Diagnosis not present

## 2016-02-15 DIAGNOSIS — C3411 Malignant neoplasm of upper lobe, right bronchus or lung: Secondary | ICD-10-CM | POA: Diagnosis not present

## 2016-02-15 MED ORDER — VALSARTAN 80 MG PO TABS: 80.0000 mg | ORAL_TABLET | Freq: Every day | ORAL | Status: DC

## 2016-02-15 NOTE — Patient Outreach (Signed)
[facility]) Care Management  02/15/2016  Tammy Ball 10-Nov-1941 [id number]  Transition of care (week 4)  RN spoke with pt today who indicates she is recovery well and improving "everyday". States she has even worked a few hours on the job and can drive with not taking her pain medications. All clear from her neurologist on an appointment in February. Verifies she has attended all medical appointments and taken all prescribed and recommended medications as plan of care was discussed. Pt has met all goals with no readmission and feels she is managing her care. Pt has declined a Engineer, maintenance although grateful but feels she is able to managing her care independently. PT has [facility] contact number if needed in the future and again expressed how grateful she was for the follow up appointments. No other request or inquires at this time as case will be closed with goals met at this time.  Raina Mina, RN Care Management Coordinator [place] Office [phone]

## 2016-02-21 ENCOUNTER — Ambulatory Visit
Admission: RE | Admit: 2016-02-21 | Discharge: 2016-02-21 | Disposition: A | Discharge status: Home / Self Care | Payer: Medicare Other | Source: Ambulatory Visit | Attending: Radiation Oncology | Admitting: Radiation Oncology

## 2016-02-21 DIAGNOSIS — C3491 Malignant neoplasm of unspecified part of right bronchus or lung: Secondary | ICD-10-CM | POA: Insufficient documentation

## 2016-02-21 DIAGNOSIS — Z510 Encounter for antineoplastic radiation therapy: Secondary | ICD-10-CM | POA: Diagnosis not present

## 2016-02-22 NOTE — Progress Notes (Signed)
  Radiation Oncology         [phone] ________________________________  Name: Tammy Ball MRN: [id number]  Date: 02/21/2016  DOB: [birth year]  SIMULATION AND TREATMENT PLANNING NOTE    ICD-9-CM ICD-10-CM   1. Non-small cell carcinoma of right lung, stage 2 (HCC) 162.9 C34.91     DIAGNOSIS:  Stage III (ypT2a, pN2, M0) non-small cell lung cancer, squamous cell carcinoma  NARRATIVE:  The patient was brought to the [place] [facility].  Identity was confirmed.  All relevant records and images related to the planned course of therapy were reviewed.  The patient freely provided informed written consent to proceed with treatment after reviewing the details related to the planned course of therapy. The consent form was witnessed and verified by the simulation staff.  Then, the patient was set-up in a stable reproducible  supine position for radiation therapy.  CT images were obtained.  Surface markings were placed.  The CT images were loaded into the planning software.  Then the target and avoidance structures were contoured.  Treatment planning then occurred.  The radiation prescription was entered and confirmed.  Then, I designed and supervised the construction of a total of 5 medically necessary complex treatment devices.  I have requested : 3D Simulation  I have requested a DVH of the following structures: CTV,PTV, lungs, esophagus, spinal cord.  I have ordered:dose calc.  PLAN:  The patient will receive 50.4 Gy in 28 fractions Along with radiosensitizing chemotherapy.  ________________________________  Special treatment procedure note:  The patient will be receiving radiosensitizing chemotherapy in the course of her chest radiation therapy. Given the increased potential for toxicities as well as the necessity for close monitoring of the patient and bloodwork, this constitutes a special treatment procedure. -----------------------------------  Blair Promise, PhD, MD

## 2016-02-27 ENCOUNTER — Other Ambulatory Visit (HOSPITAL_BASED_OUTPATIENT_CLINIC_OR_DEPARTMENT_OTHER): Payer: Medicare Other

## 2016-02-27 ENCOUNTER — Encounter: Payer: Self-pay | Admitting: Internal Medicine

## 2016-02-27 ENCOUNTER — Ambulatory Visit (HOSPITAL_BASED_OUTPATIENT_CLINIC_OR_DEPARTMENT_OTHER): Payer: Medicare Other

## 2016-02-27 ENCOUNTER — Encounter: Payer: Self-pay | Admitting: *Deleted

## 2016-02-27 VITALS — BP 137/78 | HR 85 | Temp 97.4°F | Resp 183

## 2016-02-27 DIAGNOSIS — Z5111 Encounter for antineoplastic chemotherapy: Secondary | ICD-10-CM | POA: Diagnosis not present

## 2016-02-27 DIAGNOSIS — C3491 Malignant neoplasm of unspecified part of right bronchus or lung: Secondary | ICD-10-CM | POA: Diagnosis present

## 2016-02-27 DIAGNOSIS — Z510 Encounter for antineoplastic radiation therapy: Secondary | ICD-10-CM | POA: Diagnosis not present

## 2016-02-27 LAB — CBC WITH DIFFERENTIAL/PLATELET
BASO%: 1 % (ref 0.0–2.0)
Basophils Absolute: 0.1 10*3/uL (ref 0.0–0.1)
EOS%: 1.9 % (ref 0.0–7.0)
Eosinophils Absolute: 0.1 10*3/uL (ref 0.0–0.5)
HCT: 37 % (ref 34.8–46.6)
HGB: 11.9 g/dL (ref 11.6–15.9)
LYMPH%: 37.9 % (ref 14.0–49.7)
MCH: 31.3 pg (ref 25.1–34.0)
MCHC: 32.1 g/dL (ref 31.5–36.0)
MCV: 97.5 fL (ref 79.5–101.0)
MONO#: 0.7 10*3/uL (ref 0.1–0.9)
MONO%: 10.1 % (ref 0.0–14.0)
NEUT#: 3.6 10*3/uL (ref 1.5–6.5)
NEUT%: 49.1 % (ref 38.4–76.8)
Platelets: 276 10*3/uL (ref 145–400)
RBC: 3.79 10*6/uL (ref 3.70–5.45)
RDW: 15 % — ABNORMAL HIGH (ref 11.2–14.5)
WBC: 7.3 10*3/uL (ref 3.9–10.3)
lymph#: 2.7 10*3/uL (ref 0.9–3.3)

## 2016-02-27 LAB — COMPREHENSIVE METABOLIC PANEL
ALT: 13 U/L (ref 0–55)
AST: 15 U/L (ref 5–34)
Albumin: 3.9 g/dL (ref 3.5–5.0)
Alkaline Phosphatase: 92 U/L (ref 40–150)
Anion Gap: 9 mEq/L (ref 3–11)
BUN: 20.9 mg/dL (ref 7.0–26.0)
CO2: 23 mEq/L (ref 22–29)
Calcium: 10.1 mg/dL (ref 8.4–10.4)
Chloride: 107 mEq/L (ref 98–109)
Creatinine: 1.1 mg/dL (ref 0.6–1.1)
EGFR: 49 mL/min/{1.73_m2} — ABNORMAL LOW (ref >90)
Glucose: 97 mg/dl (ref 70–140)
Potassium: 4.8 mEq/L (ref 3.5–5.1)
Sodium: 140 mEq/L (ref 136–145)
Total Bilirubin: 0.37 mg/dL (ref 0.20–1.20)
Total Protein: 7.2 g/dL (ref 6.4–8.3)

## 2016-02-27 MED ORDER — SODIUM CHLORIDE 0.9 % IV SOLN
Freq: Once | INTRAVENOUS | Status: AC
  Administered 2016-02-27: 12:00:00 via INTRAVENOUS

## 2016-02-27 MED ORDER — DIPHENHYDRAMINE HCL 50 MG/ML IJ SOLN
INTRAMUSCULAR | Status: AC
  Filled 2016-02-27: qty 1

## 2016-02-27 MED ORDER — PALONOSETRON HCL INJECTION 0.25 MG/5ML
INTRAVENOUS | Status: AC
  Filled 2016-02-27: qty 5

## 2016-02-27 MED ORDER — DIPHENHYDRAMINE HCL 50 MG/ML IJ SOLN
50.0000 mg | Freq: Once | INTRAMUSCULAR | Status: AC
  Administered 2016-02-27: 50 mg via INTRAVENOUS

## 2016-02-27 MED ORDER — FAMOTIDINE IN NACL 20-0.9 MG/50ML-% IV SOLN
INTRAVENOUS | Status: AC
  Filled 2016-02-27: qty 50

## 2016-02-27 MED ORDER — SODIUM CHLORIDE 0.9 % IV SOLN
157.0000 mg | Freq: Once | INTRAVENOUS | Status: AC
  Administered 2016-02-27: 160 mg via INTRAVENOUS
  Filled 2016-02-27: qty 16

## 2016-02-27 MED ORDER — PALONOSETRON HCL INJECTION 0.25 MG/5ML
0.2500 mg | Freq: Once | INTRAVENOUS | Status: AC
Start: 2016-02-27 — End: 2016-02-27
  Administered 2016-02-27: 0.25 mg via INTRAVENOUS

## 2016-02-27 MED ORDER — FAMOTIDINE IN NACL 20-0.9 MG/50ML-% IV SOLN
20.0000 mg | Freq: Once | INTRAVENOUS | Status: AC
Start: 2016-02-27 — End: 2016-02-27
  Administered 2016-02-27: 20 mg via INTRAVENOUS

## 2016-02-27 MED ORDER — SODIUM CHLORIDE 0.9 % IV SOLN
20.0000 mg | Freq: Once | INTRAVENOUS | Status: AC
  Administered 2016-02-27: 20 mg via INTRAVENOUS
  Filled 2016-02-27: qty 2

## 2016-02-27 MED ORDER — SODIUM CHLORIDE 0.9 % IV SOLN
45.0000 mg/m2 | Freq: Once | INTRAVENOUS | Status: AC
  Administered 2016-02-27: 84 mg via INTRAVENOUS
  Filled 2016-02-27: qty 14

## 2016-02-27 NOTE — Progress Notes (Signed)
Introduced myself as her FA.  Informed her that because she has 2 insurances copay assistance shouldn't be needed but gave her my card for any questions or concerns she may have in the future.

## 2016-02-27 NOTE — Progress Notes (Signed)
Oncology Nurse Navigator Documentation  Oncology Nurse Navigator Flowsheets 02/27/2016  Navigator Location [facility] On[facility]  Navigator Encounter Type Treatment  Treatment Phase First Chemo Tx  Barriers/Navigation Needs Education  Education Pain/ Symptom Management  Interventions Education Method  Education Method Verbal  Acuity Level 2  Acuity Level 2 Educational needs  Time Spent with Patient 30   Spoke with patient today during her first adjuvant tx.  She had tx before surgery and needs more chemo.  I listened as she explained her feeling about going through tx.  She is worried about having bone pain from the "shot" she received last time.  I again listened as she spoke about her feelings.  I stated if she needs the shot again then we will be proactive with her symptom management.  She stated she has some knowledge of when to call so symptoms do not get bad.  I asked that she call if needed for side effect symptom management.

## 2016-02-27 NOTE — Patient Instructions (Signed)
[facility] Discharge Instructions for Patients Receiving Chemotherapy  Today you received the following chemotherapy agents:  Carboplatin, Taxol  To help prevent nausea and vomiting after your treatment, we encourage you to take your nausea medication as prescribed.   If you develop nausea and vomiting that is not controlled by your nausea medication, call the clinic.   BELOW ARE SYMPTOMS THAT SHOULD BE REPORTED IMMEDIATELY:  *FEVER GREATER THAN 100.5 F  *CHILLS WITH OR WITHOUT FEVER  NAUSEA AND VOMITING THAT IS NOT CONTROLLED WITH YOUR NAUSEA MEDICATION  *UNUSUAL SHORTNESS OF BREATH  *UNUSUAL BRUISING OR BLEEDING  TENDERNESS IN MOUTH AND THROAT WITH OR WITHOUT PRESENCE OF ULCERS  *URINARY PROBLEMS  *BOWEL PROBLEMS  UNUSUAL RASH Items with * indicate a potential emergency and should be followed up as soon as possible.  Feel free to call the clinic you have any questions or concerns. The clinic phone number is [phone].  Please show the CHEMO ALERT CARD at check-in to the Emergency Department and triage nurse.   

## 2016-02-28 ENCOUNTER — Ambulatory Visit
Admission: RE | Admit: 2016-02-28 | Discharge: 2016-02-28 | Disposition: A | Discharge status: Home / Self Care | Payer: Medicare Other | Source: Ambulatory Visit | Attending: Radiation Oncology | Admitting: Radiation Oncology

## 2016-02-28 ENCOUNTER — Other Ambulatory Visit: Payer: Self-pay | Admitting: Cardiothoracic Surgery

## 2016-02-28 DIAGNOSIS — C3491 Malignant neoplasm of unspecified part of right bronchus or lung: Secondary | ICD-10-CM

## 2016-02-28 DIAGNOSIS — Z510 Encounter for antineoplastic radiation therapy: Secondary | ICD-10-CM | POA: Diagnosis not present

## 2016-02-28 DIAGNOSIS — C3490 Malignant neoplasm of unspecified part of unspecified bronchus or lung: Secondary | ICD-10-CM

## 2016-02-28 NOTE — Progress Notes (Signed)
  Radiation Oncology         [phone] ________________________________  Name: Tammy Ball MRN: [id number]  Date: 02/28/2016  DOB: 1941/05/06  Simulation Verification Note    ICD-9-CM ICD-10-CM   1. Non-small cell carcinoma of right lung, stage 2 (HCC) 162.9 C34.91     Status: outpatient  NARRATIVE: The patient was brought to the treatment unit and placed in the planned treatment position. The clinical setup was verified. Then port films were obtained and uploaded to the radiation oncology medical record software.  The treatment beams were carefully compared against the planned radiation fields. The position location and shape of the radiation fields was reviewed. They targeted volume of tissue appears to be appropriately covered by the radiation beams. Organs at risk appear to be excluded as planned.  Based on my personal review, I approved the simulation verification. The patient's treatment will proceed as planned.  -----------------------------------  Blair Promise, PhD, MD

## 2016-02-29 ENCOUNTER — Encounter: Payer: Self-pay | Admitting: Cardiothoracic Surgery

## 2016-02-29 ENCOUNTER — Ambulatory Visit
Admission: RE | Admit: 2016-02-29 | Discharge: 2016-02-29 | Disposition: A | Discharge status: Home / Self Care | Payer: Medicare Other | Source: Ambulatory Visit | Attending: Radiation Oncology | Admitting: Radiation Oncology

## 2016-02-29 ENCOUNTER — Ambulatory Visit (INDEPENDENT_AMBULATORY_CARE_PROVIDER_SITE_OTHER): Payer: Self-pay | Admitting: Cardiothoracic Surgery

## 2016-02-29 ENCOUNTER — Ambulatory Visit
Admission: RE | Admit: 2016-02-29 | Discharge: 2016-02-29 | Disposition: A | Discharge status: Home / Self Care | Payer: Medicare Other | Source: Ambulatory Visit | Attending: Cardiothoracic Surgery | Admitting: Cardiothoracic Surgery

## 2016-02-29 VITALS — BP 117/61 | HR 83 | Resp 20 | Ht 64.0 in | Wt 175.0 lb

## 2016-02-29 DIAGNOSIS — C3411 Malignant neoplasm of upper lobe, right bronchus or lung: Secondary | ICD-10-CM

## 2016-02-29 DIAGNOSIS — R918 Other nonspecific abnormal finding of lung field: Secondary | ICD-10-CM | POA: Diagnosis not present

## 2016-02-29 DIAGNOSIS — C3491 Malignant neoplasm of unspecified part of right bronchus or lung: Secondary | ICD-10-CM | POA: Diagnosis not present

## 2016-02-29 DIAGNOSIS — Z510 Encounter for antineoplastic radiation therapy: Secondary | ICD-10-CM | POA: Diagnosis not present

## 2016-02-29 DIAGNOSIS — C3490 Malignant neoplasm of unspecified part of unspecified bronchus or lung: Secondary | ICD-10-CM

## 2016-02-29 DIAGNOSIS — Z902 Acquired absence of lung [part of]: Secondary | ICD-10-CM

## 2016-02-29 NOTE — Progress Notes (Signed)
[address]       [place] [address]             [phone]      Basha S Younkin [place] Medical Record #[id number] Date of Birth: 10/04/1941  Referring: Rigoberto Noel, MD Primary Care: [name], MD  Chief Complaint:   POST OP FOLLOW UP 01/18/2016 OPERATIVE REPORT PREOPERATIVE DIAGNOSIS: Right upper lobe non-small cell carcinoma of he lung, status post chemotherapy treatment. POSTOPERATIVE DIAGNOSIS: Right upper lobe non-small cell carcinoma of the lung, status post chemotherapy treatment. PROCEDURES: Video bronchoscopy, right video-assisted thoracoscopy, mini thoracotomy, right upper lobectomy with lymph node dissection and placement of On-Q device. SURGEON: Lanelle Bal, MD.  Non-small cell carcinoma of right lung, stage 2 [facility])   Staging form: Lung, AJCC 7th Edition     Clinical stage from 10/03/2015: Stage IIA (T2a, N1, M0) - Signed by Curt Bears, MD on 10/03/2015     Pathologic stage from 01/20/2016: Stage IIIA (yT2a, N2, cM0) - Signed by [name], MD on 01/20/2016 Renal cell cancer [facility])   Staging form: Kidney, AJCC 7th Edition     Clinical: No stage assigned - Unsigned  History of Present Illness:     Tammy Ball 75 y.o. female has been followed in the office for diagnosis of Lung Cancer., she  completed 3 cycles of chemo for Squamous cell with large right upper lobe mass diagnosed nov 2016 She has a history h/o copd gold II , frequent sinus infections, hypertension, She present to ER in Nov with 2 days of fevers and chills, sob and cough. On arrival to ED, she was febrile, tachycardic, hypotensive, hypoxic and witht elevated lactic acid.  She was found to have pneumonia on CXR. She was referred to medical service for sepsis from CAP. Bronchoscopy by Dr Elsworth Soho revealed whitish endobrochial mass seen blocking entry of RUl bronchus. She was diagnosed with clinical stage IIa squamous cell carcinoma and underwent  3 cycles of chemotherapy with significant response. On March 8 she underwent right upper lobectomy and node dissection. She now returns for follow-up visit She's made excellent progress postoperatively in fact is planning to return to work today. She is increasing her activity appropriately. She's had no fever or chills. Does complain of some fatigue and trouble sleeping   DIAGNOSIS: clinicial  Stage IIA (T2a, N1, M0) non-small cell lung cancer, squamous cell carcinoma presented with large right upper lobe lung mass with right hilar involvement diagnosed in November 2016. Post  resection  pathologic Stage IIIA   PRIOR THERAPY: Neoadjuvant systemic chemotherapy with carboplatin for AUC of 6 and paclitaxel 200 MG/M2 every 3 weeks, status post 3 cycles with partial response., Right upper lobectomy with node dissection March 8 when he 19   CURRENT THERAPY: Concurrent chemoradiation with weekly carboplatin for AUC of 2 and paclitaxel 45 MG/M2. First dose 02/27/2016.    Past Medical History  Diagnosis Date  . COPD (chronic obstructive pulmonary disease) ([place])   . Hypertension   . Depression   . Asthma   . Glaucoma   . Hyperlipidemia   . Heart murmur   . Non-small cell carcinoma of right lung, stage 2 ([place]) 10/03/2015  . Chemotherapy-induced neuropathy ([place]) 11/01/2015  . History of nuclear stress test     Myoview 2/17: no ischemia or scar, EF 79%; low risk  . History of echocardiogram     Echo 2/17: EF 10-62%, grade 1 diastolic dysfunction, mild MR, trivial pericardial  effusion  . Renal cell carcinoma ([place])     L nephrectomy  in 2012  . Anxiety   . Claustrophobia   . Headache     prior to menopause  . Anemia     was while doing chemo  . Encounter for antineoplastic chemotherapy 02/09/2016     History  Smoking status  . Former Smoker -- 0.50 packs/day for 50 years  . Types: Cigarettes  . Quit date: 03/16/2011  Smokeless tobacco  . Never Used    History  Alcohol Use No      Allergies  Allergen Reactions  . Bee Venom Anaphylaxis, Shortness Of Breath, Swelling and Other (See Comments)    Swelling at site   . Amlodipine Swelling and Other (See Comments)    Swelling of the ankles and hands   . Levofloxacin Other (See Comments)    Joint pain   . Hctz [Hydrochlorothiazide] Palpitations and Other (See Comments)    Sweating     Current Outpatient Prescriptions  Medication Sig Dispense Refill  . acetaminophen (TYLENOL) 500 MG tablet Take 1,000 mg by mouth every 6 (six) hours as needed for mild pain, moderate pain, fever or headache. Reported on 02/09/2016    . albuterol (PROVENTIL HFA;VENTOLIN HFA) 108 (90 BASE) MCG/ACT inhaler Inhale 2 puffs into the lungs every 4 (four) hours as needed for wheezing or shortness of breath. 1 Inhaler 0  . aspirin EC 81 MG tablet Take 81 mg by mouth daily.    Marland Kitchen atorvastatin (LIPITOR) 40 MG tablet TAKE ONE TABLET BY MOUTH ONCE DAILY. (Patient taking differently: Take 40 mg by mouth daily. ) 90 tablet 3  . benzonatate (TESSALON) 200 MG capsule Take 1 capsule (200 mg total) by mouth 3 (three) times daily as needed for cough. 20 capsule 0  . CALCIUM PO Take 1 tablet by mouth daily.    . Cholecalciferol (VITAMIN D-3 PO) Take 1 tablet by mouth daily.    . fish oil-omega-3 fatty acids 1000 MG capsule Take 1 capsule by mouth daily.     . fluticasone (FLONASE) 50 MCG/ACT nasal spray Place 2 sprays into both nostrils daily. 9.9 g 0  . LORazepam (ATIVAN) 0.5 MG tablet Take 1 tablet (0.5 mg total) by mouth 2 (two) times daily as needed for anxiety. 10 tablet 1  . mometasone-formoterol (DULERA) 200-5 MCG/ACT AERO Inhale 2 puffs into the lungs 2 (two) times daily. 3 Inhaler 0  . oxyCODONE-acetaminophen (PERCOCET/ROXICET) 5-325 MG tablet Take 1-2 tablets by mouth every 6 (six) hours as needed for severe pain. 30 tablet 0  . polyvinyl alcohol-povidone (REFRESH) 1.4-0.6 % ophthalmic solution Place 2 drops into both eyes daily as needed (for dry  eyes).    . traMADol (ULTRAM) 50 MG tablet Reported on 02/09/2016    . UNABLE TO FIND Med Name: Wig 1 each 0  . valsartan (DIOVAN) 80 MG tablet Take 1 tablet (80 mg total) by mouth daily. 90 tablet 0   No current facility-administered medications for this visit.       Physical Exam: BP 117/61 mmHg  Pulse 83  Resp 20  Ht '5\' 4"'$  (1.626 m)  Wt 175 lb (79.379 kg)  BMI 30.02 kg/m2  SpO2 94%  General appearance: alert and cooperative Neurologic: intact Heart: regular rate and rhythm, S1, S2 normal, no murmur, click, rub or gallop Lungs: clear to auscultation bilaterally Abdomen: soft, non-tender; bowel sounds normal; no masses,  no organomegaly Extremities: extremities normal, atraumatic, no cyanosis or edema Wound: Right  chest incision and port sites are healing well   Diagnostic Studies & Laboratory data:     Recent Radiology Findings:   Dg Chest 2 View  02/29/2016  CLINICAL DATA:  Hx of malignant neoplasm of lung Hx of Rt VATS 01/18/2016 Hx of renal ca Stat reading, pt gone to office now for appt EXAM: CHEST - 2 VIEW COMPARISON:  02/09/2016 FINDINGS: Lungs are clear. Heart size and mediastinal contours are within normal limits. Surgical clips near the right hilum. Atheromatous aorta. No effusion. No pneumothorax. Cervical fixation hardware noted. Visualized skeletal structures otherwise unremarkable. IMPRESSION: No acute cardiopulmonary disease. Electronically Signed   By: Lucrezia Europe M.D.   On: 02/29/2016 14:21      Recent Lab Findings: Lab Results  Component Value Date   WBC 7.3 02/27/2016   HGB 11.9 02/27/2016   HCT 37.0 02/27/2016   PLT 276 02/27/2016   GLUCOSE 97 02/27/2016   CHOL 174 12/10/2015   TRIG 150* 12/10/2015   HDL 89 12/10/2015   LDLCALC 55 12/10/2015   ALT 13 02/27/2016   AST 15 02/27/2016   NA 140 02/27/2016   K 4.8 02/27/2016   CL 98* 01/23/2016   CREATININE 1.1 02/27/2016   BUN 20.9 02/27/2016   CO2 23 02/27/2016   TSH 1.583 12/14/2011   INR 1.00  01/16/2016      Assessment / Plan:      Patient doing well postoperatively following right upper lobectomy and node dissection for pathologic stage IIIa carcinoma previously treated with 3 rounds of chemotherapy., Findings were discussed in the [employer], she is now proceeding with concomitant weekly chem and radiation We'll plan to see the patient back in 3 months  with a follow-up chest x-ray.  [name] MD      [address] [place] [address] Office [phone]   Beeper [phone]  03/01/2016 6:23 PM

## 2016-03-01 ENCOUNTER — Ambulatory Visit
Admission: RE | Admit: 2016-03-01 | Discharge: 2016-03-01 | Disposition: A | Discharge status: Home / Self Care | Payer: Medicare Other | Source: Ambulatory Visit | Attending: Radiation Oncology | Admitting: Radiation Oncology

## 2016-03-01 DIAGNOSIS — C3491 Malignant neoplasm of unspecified part of right bronchus or lung: Secondary | ICD-10-CM | POA: Diagnosis not present

## 2016-03-01 DIAGNOSIS — Z510 Encounter for antineoplastic radiation therapy: Secondary | ICD-10-CM | POA: Diagnosis not present

## 2016-03-02 ENCOUNTER — Ambulatory Visit
Admission: RE | Admit: 2016-03-02 | Discharge: 2016-03-02 | Disposition: A | Discharge status: Home / Self Care | Payer: Medicare Other | Source: Ambulatory Visit | Attending: Radiation Oncology | Admitting: Radiation Oncology

## 2016-03-02 DIAGNOSIS — C3491 Malignant neoplasm of unspecified part of right bronchus or lung: Secondary | ICD-10-CM | POA: Diagnosis not present

## 2016-03-02 DIAGNOSIS — Z510 Encounter for antineoplastic radiation therapy: Secondary | ICD-10-CM | POA: Diagnosis not present

## 2016-03-02 MED ORDER — RADIAPLEXRX EX GEL
Freq: Once | CUTANEOUS | Status: AC
  Administered 2016-03-02: 13:00:00 via TOPICAL

## 2016-03-02 NOTE — Progress Notes (Signed)
Pt here for patient teaching.  Pt given Radiation and You booklet and Radiaplex gel. Pt reports they have not watched the Radiation Therapy Education video and has been given the link to watch at home.  Reviewed areas of pertinence such as fatigue, skin changes, throat changes and cough . Pt able to give teach back of to pat skin and use unscented/gentle soap,apply Radiaplex bid and avoid applying anything to skin within 4 hours of treatment. Pt demonstrated understanding and verbalizes understanding of information given and will contact nursing with any questions or concerns.     [email or web address]

## 2016-03-05 ENCOUNTER — Encounter: Payer: Self-pay | Admitting: Internal Medicine

## 2016-03-05 ENCOUNTER — Ambulatory Visit (HOSPITAL_BASED_OUTPATIENT_CLINIC_OR_DEPARTMENT_OTHER): Payer: Medicare Other

## 2016-03-05 ENCOUNTER — Other Ambulatory Visit (HOSPITAL_BASED_OUTPATIENT_CLINIC_OR_DEPARTMENT_OTHER): Payer: Medicare Other

## 2016-03-05 ENCOUNTER — Encounter: Payer: Self-pay | Admitting: *Deleted

## 2016-03-05 ENCOUNTER — Ambulatory Visit
Admission: RE | Admit: 2016-03-05 | Discharge: 2016-03-05 | Disposition: A | Discharge status: Home / Self Care | Payer: Medicare Other | Source: Ambulatory Visit | Attending: Radiation Oncology | Admitting: Radiation Oncology

## 2016-03-05 ENCOUNTER — Ambulatory Visit (HOSPITAL_BASED_OUTPATIENT_CLINIC_OR_DEPARTMENT_OTHER): Payer: Medicare Other | Admitting: Internal Medicine

## 2016-03-05 VITALS — BP 118/71 | HR 95 | Temp 99.1°F | Resp 18 | Ht 64.0 in | Wt 175.3 lb

## 2016-03-05 DIAGNOSIS — C3411 Malignant neoplasm of upper lobe, right bronchus or lung: Secondary | ICD-10-CM

## 2016-03-05 DIAGNOSIS — C778 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: Secondary | ICD-10-CM

## 2016-03-05 DIAGNOSIS — C3491 Malignant neoplasm of unspecified part of right bronchus or lung: Secondary | ICD-10-CM | POA: Diagnosis not present

## 2016-03-05 DIAGNOSIS — R079 Chest pain, unspecified: Secondary | ICD-10-CM

## 2016-03-05 DIAGNOSIS — Z5111 Encounter for antineoplastic chemotherapy: Secondary | ICD-10-CM | POA: Diagnosis not present

## 2016-03-05 DIAGNOSIS — R5383 Other fatigue: Secondary | ICD-10-CM

## 2016-03-05 DIAGNOSIS — Z510 Encounter for antineoplastic radiation therapy: Secondary | ICD-10-CM | POA: Diagnosis not present

## 2016-03-05 LAB — CBC WITH DIFFERENTIAL/PLATELET
BASO%: 0.4 % (ref 0.0–2.0)
Basophils Absolute: 0 10*3/uL (ref 0.0–0.1)
EOS%: 2.1 % (ref 0.0–7.0)
Eosinophils Absolute: 0.1 10*3/uL (ref 0.0–0.5)
HCT: 38.6 % (ref 34.8–46.6)
HGB: 12.5 g/dL (ref 11.6–15.9)
LYMPH%: 35 % (ref 14.0–49.7)
MCH: 31.6 pg (ref 25.1–34.0)
MCHC: 32.4 g/dL (ref 31.5–36.0)
MCV: 97.7 fL (ref 79.5–101.0)
MONO#: 0.3 10*3/uL (ref 0.1–0.9)
MONO%: 6 % (ref 0.0–14.0)
NEUT#: 2.8 10*3/uL (ref 1.5–6.5)
NEUT%: 56.5 % (ref 38.4–76.8)
Platelets: 289 10*3/uL (ref 145–400)
RBC: 3.95 10*6/uL (ref 3.70–5.45)
RDW: 14.1 % (ref 11.2–14.5)
WBC: 4.9 10*3/uL (ref 3.9–10.3)
lymph#: 1.7 10*3/uL (ref 0.9–3.3)

## 2016-03-05 LAB — COMPREHENSIVE METABOLIC PANEL
ALT: 13 U/L (ref 0–55)
AST: 15 U/L (ref 5–34)
Albumin: 4.1 g/dL (ref 3.5–5.0)
Alkaline Phosphatase: 86 U/L (ref 40–150)
Anion Gap: 8 mEq/L (ref 3–11)
BUN: 23.2 mg/dL (ref 7.0–26.0)
CO2: 25 mEq/L (ref 22–29)
Calcium: 10.2 mg/dL (ref 8.4–10.4)
Chloride: 106 mEq/L (ref 98–109)
Creatinine: 1.1 mg/dL (ref 0.6–1.1)
EGFR: 49 mL/min/{1.73_m2} — ABNORMAL LOW (ref >90)
Glucose: 96 mg/dl (ref 70–140)
Potassium: 5.1 mEq/L (ref 3.5–5.1)
Sodium: 139 mEq/L (ref 136–145)
Total Bilirubin: 0.33 mg/dL (ref 0.20–1.20)
Total Protein: 7.5 g/dL (ref 6.4–8.3)

## 2016-03-05 MED ORDER — CARBOPLATIN CHEMO INJECTION 450 MG/45ML
157.0000 mg | Freq: Once | INTRAVENOUS | Status: AC
  Administered 2016-03-05: 160 mg via INTRAVENOUS
  Filled 2016-03-05: qty 16

## 2016-03-05 MED ORDER — PALONOSETRON HCL INJECTION 0.25 MG/5ML
INTRAVENOUS | Status: AC
  Filled 2016-03-05: qty 5

## 2016-03-05 MED ORDER — SODIUM CHLORIDE 0.9 % IV SOLN
20.0000 mg | Freq: Once | INTRAVENOUS | Status: AC
  Administered 2016-03-05: 20 mg via INTRAVENOUS
  Filled 2016-03-05: qty 2

## 2016-03-05 MED ORDER — FAMOTIDINE IN NACL 20-0.9 MG/50ML-% IV SOLN
INTRAVENOUS | Status: AC
  Filled 2016-03-05: qty 50

## 2016-03-05 MED ORDER — FAMOTIDINE IN NACL 20-0.9 MG/50ML-% IV SOLN
20.0000 mg | Freq: Once | INTRAVENOUS | Status: AC
  Administered 2016-03-05: 20 mg via INTRAVENOUS

## 2016-03-05 MED ORDER — SODIUM CHLORIDE 0.9 % IV SOLN
45.0000 mg/m2 | Freq: Once | INTRAVENOUS | Status: AC
  Administered 2016-03-05: 84 mg via INTRAVENOUS
  Filled 2016-03-05: qty 14

## 2016-03-05 MED ORDER — DIPHENHYDRAMINE HCL 50 MG/ML IJ SOLN
INTRAMUSCULAR | Status: AC
  Filled 2016-03-05: qty 1

## 2016-03-05 MED ORDER — SODIUM CHLORIDE 0.9 % IV SOLN
Freq: Once | INTRAVENOUS | Status: AC
  Administered 2016-03-05: 13:00:00 via INTRAVENOUS

## 2016-03-05 MED ORDER — PALONOSETRON HCL INJECTION 0.25 MG/5ML
0.2500 mg | Freq: Once | INTRAVENOUS | Status: AC
Start: 2016-03-05 — End: 2016-03-05
  Administered 2016-03-05: 0.25 mg via INTRAVENOUS

## 2016-03-05 MED ORDER — DIPHENHYDRAMINE HCL 50 MG/ML IJ SOLN
50.0000 mg | Freq: Once | INTRAMUSCULAR | Status: AC
  Administered 2016-03-05: 50 mg via INTRAVENOUS

## 2016-03-05 NOTE — Progress Notes (Signed)
Oncology Nurse Navigator Documentation  Oncology Nurse Navigator Flowsheets 03/05/2016  Navigator Location [facility]  Treatment Phase Treatment  Barriers/Navigation Needs No barriers at this time  Interventions None required  Acuity Level 1  Acuity Level 1 Minimal follow up required  Time Spent with Patient 81   Spoke with patient today at [facility] before her 2nd cycle.  She is doing well.  She has some complaints of mild nausea but according to her not bad.  She has nausea medication at home.  No barriers identified at this time.

## 2016-03-05 NOTE — Progress Notes (Signed)
[place] [phone]   Fax:([phone]) [place], MD [address] [place] [address]  DIAGNOSIS: Stage IIIA (T2a, N2, M0) non-small cell lung cancer, squamous cell carcinoma presented with large right upper lobe lung mass with right hilar involvement diagnosed in November 2016.  PRIOR THERAPY:  1) Neoadjuvant systemic chemotherapy with carboplatin for AUC of 6 and paclitaxel 200 MG/M2 every 3 weeks, status post 3 cycles with partial response. 2) Video bronchoscopy, right video-assisted thoracoscopy, minithoracotomy, right upper lobectomy with lymph node dissection on 01/18/2016. The final pathology revealed residual squamous cell carcinoma measuring 3.1 cm with close resection margin and metastatic carcinoma to lymph nodes at levels 10R, 12R and 4R. (ypT2a, yN2).   CURRENT THERAPY: Concurrent chemoradiation with weekly carboplatin for AUC of 2 and paclitaxel 45 MG/M2. First dose 02/27/2016. Status post one cycle  INTERVAL HISTORY: Tammy Ball 75 y.o. female returns to the clinic today for follow-up visit. She is currently undergoing concurrent chemoradiation with weekly carboplatin and paclitaxel is status post 1 cycle. She tolerated the first week of her treatment fairly well with no significant adverse effects. She continues to have She continues to have mild fatigue and aching right-sided chest pain. She denied having any significant weight loss or night sweats. She denied having any nausea or vomiting, no fever or chills. She has no significant shortness of breath, cough or hemoptysis. She is here today for evaluation before starting cycle #2.  MEDICAL HISTORY: Past Medical History  Diagnosis Date  . COPD (chronic obstructive pulmonary disease) ([place])   . Hypertension   . Depression   . Asthma   . Glaucoma   . Hyperlipidemia   . Heart murmur   . Non-small cell carcinoma of right lung, stage 2 ([place]) 10/03/2015   . Chemotherapy-induced neuropathy ([place]) 11/01/2015  . History of nuclear stress test     Myoview 2/17: no ischemia or scar, EF 79%; low risk  . History of echocardiogram     Echo 2/17: EF 70-96%, grade 1 diastolic dysfunction, mild MR, trivial pericardial effusion  . Renal cell carcinoma ([place])     L nephrectomy  in 2012  . Anxiety   . Claustrophobia   . Headache     prior to menopause  . Anemia     was while doing chemo  . Encounter for antineoplastic chemotherapy 02/09/2016    ALLERGIES:  is allergic to bee venom; amlodipine; levofloxacin; and hctz.  MEDICATIONS:  Current Outpatient Prescriptions  Medication Sig Dispense Refill  . acetaminophen (TYLENOL) 500 MG tablet Take 1,000 mg by mouth every 6 (six) hours as needed for mild pain, moderate pain, fever or headache. Reported on 02/09/2016    . aspirin EC 81 MG tablet Take 81 mg by mouth daily.    Marland Kitchen atorvastatin (LIPITOR) 40 MG tablet TAKE ONE TABLET BY MOUTH ONCE DAILY. (Patient taking differently: Take 40 mg by mouth daily. ) 90 tablet 3  . CALCIUM PO Take 1 tablet by mouth daily.    . Cholecalciferol (VITAMIN D-3 PO) Take 1 tablet by mouth daily.    . fish oil-omega-3 fatty acids 1000 MG capsule Take 1 capsule by mouth daily.     . fluticasone (FLONASE) 50 MCG/ACT nasal spray Place 2 sprays into both nostrils daily. 9.9 g 0  . hyaluronate sodium (RADIAPLEXRX) GEL Apply 1 application topically 2 (two) times daily.    Marland Kitchen LORazepam (ATIVAN) 0.5 MG tablet Take 1 tablet (  0.5 mg total) by mouth 2 (two) times daily as needed for anxiety. 10 tablet 1  . mometasone-formoterol (DULERA) 200-5 MCG/ACT AERO Inhale 2 puffs into the lungs 2 (two) times daily. 3 Inhaler 0  . polyvinyl alcohol-povidone (REFRESH) 1.4-0.6 % ophthalmic solution Place 2 drops into both eyes daily as needed (for dry eyes).    . traMADol (ULTRAM) 50 MG tablet Reported on 02/09/2016    . valsartan (DIOVAN) 80 MG tablet Take 1 tablet (80 mg total) by mouth daily. 90  tablet 0  . albuterol (PROVENTIL HFA;VENTOLIN HFA) 108 (90 BASE) MCG/ACT inhaler Inhale 2 puffs into the lungs every 4 (four) hours as needed for wheezing or shortness of breath. (Patient not taking: Reported on 03/05/2016) 1 Inhaler 0  . benzonatate (TESSALON) 200 MG capsule Take 1 capsule (200 mg total) by mouth 3 (three) times daily as needed for cough. (Patient not taking: Reported on 03/05/2016) 20 capsule 0  . oxyCODONE-acetaminophen (PERCOCET/ROXICET) 5-325 MG tablet Take 1-2 tablets by mouth every 6 (six) hours as needed for severe pain. (Patient not taking: Reported on 03/05/2016) 30 tablet 0  . UNABLE TO FIND Med Name: Wig 1 each 0   No current facility-administered medications for this visit.    SURGICAL HISTORY:  Past Surgical History  Procedure Laterality Date  . Nephrectomy    . Kidney cancer    . Tubal ligation    . Spine surgery    . Eye surgery    . Video bronchoscopy Bilateral 09/20/2015    Procedure: VIDEO BRONCHOSCOPY WITHOUT FLUORO;  Surgeon: Rigoberto Noel, MD;  Location: [facility];  Service: Cardiopulmonary;  Laterality: Bilateral;  . Back surgery      cervical 1991  . Video bronchoscopy N/A 01/18/2016    Procedure: VIDEO BRONCHOSCOPY;  Surgeon: [name], MD;  Location: [place];  Service: Thoracic;  Laterality: N/A;  . Video assisted thoracoscopy (vats)/wedge resection Right 01/18/2016    Procedure: VIDEO ASSISTED THORACOSCOPY (VATS)/LUNG RESECTION, THOROCOTOMY, RIGHT UPPER LOBECTOMY, LYMPH NODE DISSECTION, PLACEMENT OF ON Q;  Surgeon: [name], MD;  Location: [place];  Service: Thoracic;  Laterality: Right;    REVIEW OF SYSTEMS:  A comprehensive review of systems was negative except for: Constitutional: positive for fatigue Respiratory: positive for pleurisy/chest pain Neurological: positive for paresthesia   PHYSICAL EXAMINATION: General appearance: alert, cooperative, fatigued and no distress Head: Normocephalic, without obvious abnormality,  atraumatic Neck: no adenopathy, no JVD, supple, symmetrical, trachea midline and thyroid not enlarged, symmetric, no tenderness/mass/nodules Lymph nodes: Cervical, supraclavicular, and axillary nodes normal. Resp: clear to auscultation bilaterally Back: symmetric, no curvature. ROM normal. No CVA tenderness. Cardio: regular rate and rhythm, S1, S2 normal, no murmur, click, rub or gallop GI: soft, non-tender; bowel sounds normal; no masses,  no organomegaly Extremities: extremities normal, atraumatic, no cyanosis or edema Neurologic: Alert and oriented X 3, normal strength and tone. Normal symmetric reflexes. Normal coordination and gait  ECOG PERFORMANCE STATUS: 1 - Symptomatic but completely ambulatory  Blood pressure 118/71, pulse 95, temperature 99.1 F (37.3 C), temperature source Oral, resp. rate 18, height '5\' 4"'$  (1.626 m), weight 175 lb 4.8 oz (79.516 kg), SpO2 98 %.  LABORATORY DATA: Lab Results  Component Value Date   WBC 4.9 03/05/2016   HGB 12.5 03/05/2016   HCT 38.6 03/05/2016   MCV 97.7 03/05/2016   PLT 289 03/05/2016      Chemistry      Component Value Date/Time   NA 139 03/05/2016 0958  NA 137 01/23/2016 0301   K 5.1 03/05/2016 0958   K 4.5 01/23/2016 0301   CL 98* 01/23/2016 0301   CO2 25 03/05/2016 0958   CO2 33* 01/23/2016 0301   BUN 23.2 03/05/2016 0958   BUN 20 01/23/2016 0301   BUN 24* 12/11/2011   CREATININE 1.1 03/05/2016 0958   CREATININE 1.03* 01/23/2016 0301   CREATININE 1.08 12/02/2013 0911   GLU 105 12/10/2009      Component Value Date/Time   CALCIUM 10.2 03/05/2016 0958   CALCIUM 9.1 01/23/2016 0301   ALKPHOS 86 03/05/2016 0958   ALKPHOS 61 01/20/2016 0425   AST 15 03/05/2016 0958   AST 22 01/20/2016 0425   ALT 13 03/05/2016 0958   ALT 15 01/20/2016 0425   BILITOT 0.33 03/05/2016 0958   BILITOT 0.3 01/20/2016 0425       RADIOGRAPHIC STUDIES: Dg Chest 2 View  02/29/2016  CLINICAL DATA:  Hx of malignant neoplasm of lung Hx of Rt  VATS 01/18/2016 Hx of renal ca Stat reading, pt gone to office now for appt EXAM: CHEST - 2 VIEW COMPARISON:  02/09/2016 FINDINGS: Lungs are clear. Heart size and mediastinal contours are within normal limits. Surgical clips near the right hilum. Atheromatous aorta. No effusion. No pneumothorax. Cervical fixation hardware noted. Visualized skeletal structures otherwise unremarkable. IMPRESSION: No acute cardiopulmonary disease. Electronically Signed   By: Lucrezia Europe M.D.   On: 02/29/2016 14:21   Dg Chest 2 View  02/09/2016  CLINICAL DATA:  Lung cancer.  Recent surgery. EXAM: CHEST - 2 VIEW COMPARISON:  Two-view chest x-ray 01/23/2016. FINDINGS: The heart size is normal. The previously seen right-sided pneumothorax and subcutaneous emphysema has resolved. There is volume loss at the right apex with tenting of the right hemidiaphragm compatible with a resection. No focal airspace disease caudal edema, or effusion is present. The visualized soft tissues and bony thorax are unremarkable. Atherosclerotic calcifications are present at the aorta. IMPRESSION: 1. Interval resolution of right-sided pneumothorax and subcutaneous emphysema. 2. Status post right upper lobectomy. 3. No acute cardiopulmonary disease. 4. Atherosclerosis of the thoracic aorta. Electronically Signed   By: San Morelle M.D.   On: 02/09/2016 09:10    ASSESSMENT AND PLAN: This is a very pleasant 75 years old white female recently diagnosed with stage II a non-small cell lung cancer, squamous cell carcinoma and completed neoadjuvant systemic chemotherapy with carboplatin and paclitaxel status post 3 cycles.  The recent CT scan of the chest showed partial response response with significant decrease in the size of the central right upper lobe pulmonary nodule and increased cavitation.  The patient underwent right upper lobectomy with lymph node dissection that showed residual tumor in addition to metastatic disease to the mediastinal lymph  nodes and close resection margin. The patient was started on a course of concurrent chemoradiation with weekly carboplatin and paclitaxel status post 1 cycle. She tolerated the first cycle well. I recommended for her to proceed with cycle #2 today as a scheduled. She would come back for follow-up visit in 2 weeks for evaluation and management of any adverse effect of her treatment. She was advised to call immediately if she has any concerning symptoms in the interval. The patient voices understanding of current disease status and treatment options and is in agreement with the current care plan.  All questions were answered. The patient knows to call the clinic with any problems, questions or concerns. We can certainly see the patient much sooner if necessary.  Disclaimer:  This note was dictated with voice recognition software. Similar sounding words can inadvertently be transcribed and may not be corrected upon review.

## 2016-03-05 NOTE — Patient Instructions (Signed)
[facility] Discharge Instructions for Patients Receiving Chemotherapy  Today you received the following chemotherapy agents:  Carboplatin and Taxol.  To help prevent nausea and vomiting after your treatment, we encourage you to take your nausea medication as prescribed.   If you develop nausea and vomiting that is not controlled by your nausea medication, call the clinic.   BELOW ARE SYMPTOMS THAT SHOULD BE REPORTED IMMEDIATELY:  *FEVER GREATER THAN 100.5 F  *CHILLS WITH OR WITHOUT FEVER  NAUSEA AND VOMITING THAT IS NOT CONTROLLED WITH YOUR NAUSEA MEDICATION  *UNUSUAL SHORTNESS OF BREATH  *UNUSUAL BRUISING OR BLEEDING  TENDERNESS IN MOUTH AND THROAT WITH OR WITHOUT PRESENCE OF ULCERS  *URINARY PROBLEMS  *BOWEL PROBLEMS  UNUSUAL RASH Items with * indicate a potential emergency and should be followed up as soon as possible.  Feel free to call the clinic you have any questions or concerns. The clinic phone number is [phone].  Please show the [facility] at check-in to the Emergency Department and triage nurse.

## 2016-03-06 ENCOUNTER — Encounter: Payer: Self-pay | Admitting: Radiation Oncology

## 2016-03-06 ENCOUNTER — Ambulatory Visit
Admission: RE | Admit: 2016-03-06 | Discharge: 2016-03-06 | Disposition: A | Discharge status: Home / Self Care | Payer: Medicare Other | Source: Ambulatory Visit | Attending: Radiation Oncology | Admitting: Radiation Oncology

## 2016-03-06 VITALS — BP 143/73 | HR 88 | Temp 99.1°F | Ht 64.0 in | Wt 178.1 lb

## 2016-03-06 DIAGNOSIS — C3491 Malignant neoplasm of unspecified part of right bronchus or lung: Secondary | ICD-10-CM

## 2016-03-06 DIAGNOSIS — Z510 Encounter for antineoplastic radiation therapy: Secondary | ICD-10-CM | POA: Diagnosis not present

## 2016-03-06 NOTE — Progress Notes (Signed)
  Radiation Oncology         [phone] ________________________________  Name: Tammy Ball MRN: [id number]  Date: 03/06/2016  DOB: 1940/12/06  Weekly Radiation Therapy Management    ICD-9-CM ICD-10-CM   1. Non-small cell carcinoma of right lung, stage 2 (HCC) 162.9 C34.91     Current Dose: 9 Gy     Planned Dose:  28 Gy  Narrative . . . . . . . . The patient presents for routine under treatment assessment.                      Tammy Ball has received 5 fractions to her right central chest. Denies any pain at this time, but reports indigestion over the past weekend. She reports that it's manageable. Chemotherapy yesterday-Carboplatin and Paclitaxel. Reports fatigue. Skin remians intact without any redness nor tanning.                                 Set-up films were reviewed.                                 The chart was checked. Physical Findings. . .  height is '5\' 4"'$  (1.626 m) and weight is 178 lb 1.6 oz (80.786 kg). Her temperature is 99.1 F (37.3 C). Her blood pressure is 143/73 and her pulse is 88. Her oxygen saturation is 98%. . Weight essentially stable.  No significant changes. Lungs are clear to auscultation bilaterally. Heart has regular rate and rhythm. Impression . . . . . . . The patient is tolerating radiation. Plan . . . . . . . . . . . . Continue treatment as planned. I advised the patient that if her temperature reaches above 100.4, she should notify Dr. Julien Nordmann.  ________________________________   Blair Promise, PhD, MD  This document serves as a record of services personally performed by Gery Pray, MD. It was created on his behalf by Darcus Austin, a trained medical scribe. The creation of this record is based on the scribe's personal observations and the provider's statements to them. This document has been checked and approved by the attending provider.

## 2016-03-06 NOTE — Progress Notes (Addendum)
Tammy Ball has received 5 fractions to her right central chest.  Denies any pain at this time, but reports indigestion over the past weekend.  Chemotherapy yesterday-Carboplatin and paclitaxel.  Reports fatigue. Skin remians intact without any redness nor tanning. Temp 99.1

## 2016-03-07 ENCOUNTER — Other Ambulatory Visit (HOSPITAL_BASED_OUTPATIENT_CLINIC_OR_DEPARTMENT_OTHER): Payer: Medicare Other

## 2016-03-07 ENCOUNTER — Ambulatory Visit (HOSPITAL_BASED_OUTPATIENT_CLINIC_OR_DEPARTMENT_OTHER): Payer: Medicare Other | Admitting: Nurse Practitioner

## 2016-03-07 ENCOUNTER — Telehealth: Payer: Self-pay | Admitting: Medical Oncology

## 2016-03-07 ENCOUNTER — Other Ambulatory Visit: Payer: Self-pay

## 2016-03-07 ENCOUNTER — Ambulatory Visit
Admission: RE | Admit: 2016-03-07 | Discharge: 2016-03-07 | Disposition: A | Discharge status: Home / Self Care | Payer: Medicare Other | Source: Ambulatory Visit | Attending: Radiation Oncology | Admitting: Radiation Oncology

## 2016-03-07 ENCOUNTER — Telehealth: Payer: Self-pay

## 2016-03-07 ENCOUNTER — Ambulatory Visit (HOSPITAL_COMMUNITY)
Admission: RE | Admit: 2016-03-07 | Discharge: 2016-03-07 | Disposition: A | Discharge status: Home / Self Care | Payer: Medicare Other | Source: Ambulatory Visit | Attending: Nurse Practitioner | Admitting: Nurse Practitioner

## 2016-03-07 ENCOUNTER — Telehealth: Payer: Self-pay | Admitting: *Deleted

## 2016-03-07 ENCOUNTER — Other Ambulatory Visit: Payer: Self-pay | Admitting: *Deleted

## 2016-03-07 VITALS — BP 120/61 | HR 97 | Temp 98.4°F | Resp 18 | Ht 64.0 in | Wt 176.4 lb

## 2016-03-07 DIAGNOSIS — C3411 Malignant neoplasm of upper lobe, right bronchus or lung: Secondary | ICD-10-CM | POA: Diagnosis not present

## 2016-03-07 DIAGNOSIS — C3491 Malignant neoplasm of unspecified part of right bronchus or lung: Secondary | ICD-10-CM | POA: Diagnosis not present

## 2016-03-07 DIAGNOSIS — R059 Cough, unspecified: Secondary | ICD-10-CM

## 2016-03-07 DIAGNOSIS — R05 Cough: Secondary | ICD-10-CM | POA: Insufficient documentation

## 2016-03-07 DIAGNOSIS — C778 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: Secondary | ICD-10-CM | POA: Diagnosis not present

## 2016-03-07 DIAGNOSIS — Z510 Encounter for antineoplastic radiation therapy: Secondary | ICD-10-CM | POA: Diagnosis not present

## 2016-03-07 DIAGNOSIS — I700 Atherosclerosis of aorta: Secondary | ICD-10-CM | POA: Diagnosis not present

## 2016-03-07 LAB — COMPREHENSIVE METABOLIC PANEL
ALT: 16 U/L (ref 0–55)
AST: 20 U/L (ref 5–34)
Albumin: 4 g/dL (ref 3.5–5.0)
Alkaline Phosphatase: 78 U/L (ref 40–150)
Anion Gap: 9 mEq/L (ref 3–11)
BUN: 25 mg/dL (ref 7.0–26.0)
CO2: 26 mEq/L (ref 22–29)
Calcium: 9.5 mg/dL (ref 8.4–10.4)
Chloride: 105 mEq/L (ref 98–109)
Creatinine: 1.2 mg/dL — ABNORMAL HIGH (ref 0.6–1.1)
EGFR: 45 mL/min/{1.73_m2} — ABNORMAL LOW (ref >90)
Glucose: 94 mg/dl (ref 70–140)
Potassium: 4.4 mEq/L (ref 3.5–5.1)
Sodium: 140 mEq/L (ref 136–145)
Total Bilirubin: 0.36 mg/dL (ref 0.20–1.20)
Total Protein: 6.9 g/dL (ref 6.4–8.3)

## 2016-03-07 LAB — CBC WITH DIFFERENTIAL/PLATELET
BASO%: 0.8 % (ref 0.0–2.0)
Basophils Absolute: 0 10*3/uL (ref 0.0–0.1)
EOS%: 0.6 % (ref 0.0–7.0)
Eosinophils Absolute: 0 10*3/uL (ref 0.0–0.5)
HCT: 36.7 % (ref 34.8–46.6)
HGB: 11.7 g/dL (ref 11.6–15.9)
LYMPH%: 22.9 % (ref 14.0–49.7)
MCH: 30.8 pg (ref 25.1–34.0)
MCHC: 31.7 g/dL (ref 31.5–36.0)
MCV: 97 fL (ref 79.5–101.0)
MONO#: 0.3 10*3/uL (ref 0.1–0.9)
MONO%: 5.6 % (ref 0.0–14.0)
NEUT#: 4.1 10*3/uL (ref 1.5–6.5)
NEUT%: 70.1 % (ref 38.4–76.8)
Platelets: 257 10*3/uL (ref 145–400)
RBC: 3.78 10*6/uL (ref 3.70–5.45)
RDW: 14.9 % — ABNORMAL HIGH (ref 11.2–14.5)
WBC: 5.8 10*3/uL (ref 3.9–10.3)
lymph#: 1.3 10*3/uL (ref 0.9–3.3)

## 2016-03-07 MED ORDER — METHYLPREDNISOLONE 4 MG PO TBPK: ORAL_TABLET | ORAL | Status: DC

## 2016-03-07 NOTE — Telephone Encounter (Signed)
Pt left a message that she is coughing and having "breathing issues" .I left a message to call back and talk to triage.

## 2016-03-07 NOTE — Telephone Encounter (Signed)
Pt reported to [facility] for radiation today and filled out a walk in form. Pt is requesting to be evaluated. Productive cough with white thick mucous. She reports wheezing on inspirations. Pt states she had a fever yesterday of 99.1, had not taken temp today. She was unable to sleep last night.   POF entered for [facility] visit, labs ordered and a cxr.  Notified Santiago Glad, RN in radiation oncology to notify pt to go to radiology then check back in for [place]. Santiago Glad will notify pt.

## 2016-03-07 NOTE — Telephone Encounter (Signed)
Radiology dept calling for chest xray order- unable to get a hold of Dr. Julien Nordmann Luella Cook.  Per Selena Lesser, NP patient needs a 2 view STAT chest xray ordered.  Ordered placed by Probation officer per [employer].

## 2016-03-08 ENCOUNTER — Ambulatory Visit
Admission: RE | Admit: 2016-03-08 | Discharge: 2016-03-08 | Disposition: A | Discharge status: Home / Self Care | Payer: Medicare Other | Source: Ambulatory Visit | Attending: Radiation Oncology | Admitting: Radiation Oncology

## 2016-03-08 ENCOUNTER — Encounter: Payer: Self-pay | Admitting: Nurse Practitioner

## 2016-03-08 DIAGNOSIS — Z510 Encounter for antineoplastic radiation therapy: Secondary | ICD-10-CM | POA: Diagnosis not present

## 2016-03-08 DIAGNOSIS — R059 Cough, unspecified: Secondary | ICD-10-CM | POA: Insufficient documentation

## 2016-03-08 DIAGNOSIS — C3491 Malignant neoplasm of unspecified part of right bronchus or lung: Secondary | ICD-10-CM | POA: Diagnosis not present

## 2016-03-08 DIAGNOSIS — R05 Cough: Secondary | ICD-10-CM | POA: Insufficient documentation

## 2016-03-08 NOTE — Assessment & Plan Note (Addendum)
Patient has been undergoing both chemotherapy and radiation treatments.  She reports development of a slightly congested cough since this past Monday, 03/05/2016.  She denies any chest pain, chest pressure, shortness breath, or pain with inspiration.  She states that the cough is nonproductive.  She denies any recent fevers or chills.  Exam today reveals lung sounds clear bilaterally.  It was noted that patient's O2 sat had decreased down to 92% on room air.  Patient appeared in no acute respiratory distress, however.  All labs were within normal limits today; and chest x-ray revealed no acute findings.  Reviewed all details of today's visit with Dr. Julien Nordmann; he recommended patient training Medrol Dosepak in case the cough is associated with lung inflammation secondary to radiation treatments.  Advised patient there was a low threshold to start antibiotics if Medrol Dosepak does not improve her symptoms.  Patient was advised to call/return of electrical to the emergency department for any worsening symptoms whatsoever.

## 2016-03-08 NOTE — Progress Notes (Signed)
SYMPTOM MANAGEMENT CLINIC    Chief Complaint: Cough  HPI:  Tammy Ball 75 y.o. female diagnosed with lung cancer.  Currently undergoing carboplatin/Taxol chemotherapy regimen and radiation treatments.   Patient has been undergoing both chemotherapy and radiation treatments.  She reports development of a slightly congested cough since this past Monday, 03/05/2016.  She denies any chest pain, chest pressure, shortness breath, or pain with inspiration.  She states that the cough is nonproductive.  She denies any recent fevers or chills.  Exam today reveals lung sounds clear bilaterally.  All labs were within normal limits today; and chest x-ray revealed no acute findings.  Reviewed all details of today's visit with Dr. Julien Nordmann; he recommended patient training Medrol Dosepak in case the cough is associated with lung inflammation secondary to radiation treatments.  Advised patient there was a low threshold to start antibiotics if Medrol Dosepak does not improve her symptoms.  Patient was advised to call/return of electrical to the emergency department for any worsening symptoms whatsoever.  Oncology History   Patient followed due to hx NSCLC stage IIA squamous cell dx 09/2015 s/p neoadjuvant chemotherapy   Non-small cell carcinoma of right lung, stage 2 ([place])   Staging form: Lung, AJCC 7th Edition     Clinical stage from 10/03/2015: Stage IIA (T2a, N1, M0) - Signed by Curt Bears, MD on 10/03/2015 Renal cell cancer [facility])   Staging form: Kidney, AJCC 7th Edition     Clinical: No stage assigned - Unsigned       Non-small cell carcinoma of right lung, stage 2 ([place])   09/19/2015 Imaging CTA Irregular opacity in the medial right upper lobe extending to and possibly arising from the right hilum.    09/20/2015 Surgery VIDEO BRONCHOSCOPY WITHOUT FLUORO   09/29/2015 Imaging PET IMPRESSION: 4 cm hypermetabolic central right upper lobe mass, consistent with primary bronchogenic  carcinoma. This is contiguous with the right hilum.   10/03/2015 Initial Diagnosis Non-small cell carcinoma of right lung, stage 2 ([place])   10/11/2015 - 11/22/2015 Chemotherapy taxol/carbo   10/21/2015 Imaging MRI Brain No pathologic enhancement to suggest metastatic disease of the brain or meninges.   11/19/2015 Imaging CXR IMPRESSION: There is airspace consolidation in the right upper lobe medially   12/13/2015 Imaging CT Chest IMPRESSION: Marked response to therapy. Significant decrease in size of a central right upper lobe pulmonary nodule with increase in cavitation.   01/18/2016 Surgery PROCEDURES: Video bronchoscopy, right video-assisted thoracoscopy, mini thoracotomy, right upper lobectomy with lymph node dissection and placement of On-Q device.   02/21/2016 -  Radiation Therapy Adjuvant tx [facility]   02/27/2016 -  Chemotherapy 1st adjuvant chemotherapy Carbo/Taxol    Review of Systems  Respiratory: Positive for cough.   All other systems reviewed and are negative.   Past Medical History  Diagnosis Date  . COPD (chronic obstructive pulmonary disease) ([place])   . Hypertension   . Depression   . Asthma   . Glaucoma   . Hyperlipidemia   . Heart murmur   . Non-small cell carcinoma of right lung, stage 2 ([place]) 10/03/2015  . Chemotherapy-induced neuropathy ([place]) 11/01/2015  . History of nuclear stress test     Myoview 2/17: no ischemia or scar, EF 79%; low risk  . History of echocardiogram     Echo 2/17: EF 67-89%, grade 1 diastolic dysfunction, mild MR, trivial pericardial effusion  . Renal cell carcinoma ([place])     L nephrectomy  in 2012  . Anxiety   .  Claustrophobia   . Headache     prior to menopause  . Anemia     was while doing chemo  . Encounter for antineoplastic chemotherapy 02/09/2016    Past Surgical History  Procedure Laterality Date  . Nephrectomy    . Kidney cancer    . Tubal ligation    . Spine surgery    . Eye surgery    . Video bronchoscopy Bilateral 09/20/2015     Procedure: VIDEO BRONCHOSCOPY WITHOUT FLUORO;  Surgeon: Rigoberto Noel, MD;  Location: [facility];  Service: Cardiopulmonary;  Laterality: Bilateral;  . Back surgery      cervical 1991  . Video bronchoscopy N/A 01/18/2016    Procedure: VIDEO BRONCHOSCOPY;  Surgeon: [name], MD;  Location: [facility] OR;  Service: Thoracic;  Laterality: N/A;  . Video assisted thoracoscopy (vats)/wedge resection Right 01/18/2016    Procedure: VIDEO ASSISTED THORACOSCOPY (VATS)/LUNG RESECTION, THOROCOTOMY, RIGHT UPPER LOBECTOMY, LYMPH NODE DISSECTION, PLACEMENT OF ON Q;  Surgeon: [name], MD;  Location: [place];  Service: Thoracic;  Laterality: Right;    has Essential hypertension; COPD GOLD II ; Hearing loss; Renal cell cancer ([place]); Sciatica of left side; Depression; Glaucoma; Spondylolisthesis of lumbar region; Hyperlipidemia; Acute renal failure ([place]); Morbid obesity ([place]); Non-small cell carcinoma of right lung, stage 2 ([place]); Chemotherapy-induced neuropathy ([place]); S/P lobectomy of lung; and Cough on her problem list.    is allergic to bee venom; amlodipine; levofloxacin; and hctz.    Medication List       This list is accurate as of: 03/07/16 11:59 PM.  Always use your most recent med list.               acetaminophen 500 MG tablet  Commonly known as:  TYLENOL  Take 1,000 mg by mouth every 6 (six) hours as needed for mild pain, moderate pain, fever or headache. Reported on 02/09/2016     albuterol 108 (90 Base) MCG/ACT inhaler  Commonly known as:  PROVENTIL HFA;VENTOLIN HFA  Inhale 2 puffs into the lungs every 4 (four) hours as needed for wheezing or shortness of breath.     aspirin EC 81 MG tablet  Take 81 mg by mouth daily.     atorvastatin 40 MG tablet  Commonly known as:  LIPITOR  TAKE ONE TABLET BY MOUTH ONCE DAILY.     benzonatate 200 MG capsule  Commonly known as:  TESSALON  Take 1 capsule (200 mg total) by mouth 3 (three) times daily as needed for cough.     CALCIUM PO  Take  1 tablet by mouth daily.     fish oil-omega-3 fatty acids 1000 MG capsule  Take 1 capsule by mouth daily.     fluticasone 50 MCG/ACT nasal spray  Commonly known as:  FLONASE  Place 2 sprays into both nostrils daily.     hyaluronate sodium Gel  Apply 1 application topically 2 (two) times daily.     LORazepam 0.5 MG tablet  Commonly known as:  ATIVAN  Take 1 tablet (0.5 mg total) by mouth 2 (two) times daily as needed for anxiety.     methylPREDNISolone 4 MG Tbpk tablet  Commonly known as:  MEDROL DOSEPAK  Medrol dose pak: take as directed.     mometasone-formoterol 200-5 MCG/ACT Aero  Commonly known as:  DULERA  Inhale 2 puffs into the lungs 2 (two) times daily.     oxyCODONE-acetaminophen 5-325 MG tablet  Commonly known as:  PERCOCET/ROXICET  Take 1-2  tablets by mouth every 6 (six) hours as needed for severe pain.     REFRESH 1.4-0.6 % ophthalmic solution  Generic drug:  polyvinyl alcohol-povidone  Place 2 drops into both eyes daily as needed (for dry eyes).     traMADol 50 MG tablet  Commonly known as:  ULTRAM  Reported on 03/07/2016     UNABLE TO FIND  Med Name: Wig     valsartan 80 MG tablet  Commonly known as:  DIOVAN  Take 1 tablet (80 mg total) by mouth daily.     VITAMIN D-3 PO  Take 1 tablet by mouth daily.         PHYSICAL EXAMINATION  Oncology Vitals 03/07/2016 03/06/2016  Height 163 cm 163 cm  Weight 80.015 kg 80.786 kg  Weight (lbs) 176 lbs 6 oz 178 lbs 2 oz  BMI (kg/m2) 30.28 kg/m2 30.57 kg/m2  Temp 98.4 99.1  Pulse 97 88  Resp 18 -  SpO2 92 98  BSA (m2) 1.9 m2 1.91 m2   BP Readings from Last 2 Encounters:  03/07/16 120/61  03/06/16 143/73    Physical Exam  Constitutional: She is oriented to person, place, and time and well-developed, well-nourished, and in no distress.  HENT:  Head: Normocephalic and atraumatic.  Mouth/Throat: Oropharynx is clear and moist.  Eyes: Conjunctivae and EOM are normal. Pupils are equal, round, and reactive  to light. Right eye exhibits no discharge. Left eye exhibits no discharge. No scleral icterus.  Neck: Normal range of motion. Neck supple. No JVD present. No tracheal deviation present. No thyromegaly present.  Cardiovascular: Normal rate, regular rhythm, normal heart sounds and intact distal pulses.   Pulmonary/Chest: Effort normal and breath sounds normal. No respiratory distress. She has no wheezes. She has no rales. She exhibits no tenderness.  Abdominal: Soft. Bowel sounds are normal. She exhibits no distension and no mass. There is no tenderness. There is no rebound and no guarding.  Musculoskeletal: Normal range of motion. She exhibits no edema or tenderness.  Lymphadenopathy:    She has no cervical adenopathy.  Neurological: She is alert and oriented to person, place, and time. Gait normal.  Skin: Skin is warm and dry. No rash noted. No erythema. No pallor.  Psychiatric: Affect normal.    LABORATORY DATA:. Appointment on 03/07/2016  Component Date Value Ref Range Status  . WBC 03/07/2016 5.8  3.9 - 10.3 10e3/uL Final  . NEUT# 03/07/2016 4.1  1.5 - 6.5 10e3/uL Final  . HGB 03/07/2016 11.7  11.6 - 15.9 g/dL Final  . HCT 03/07/2016 36.7  34.8 - 46.6 % Final  . Platelets 03/07/2016 257  145 - 400 10e3/uL Final  . MCV 03/07/2016 97.0  79.5 - 101.0 fL Final  . MCH 03/07/2016 30.8  25.1 - 34.0 pg Final  . MCHC 03/07/2016 31.7  31.5 - 36.0 g/dL Final  . RBC 03/07/2016 3.78  3.70 - 5.45 10e6/uL Final  . RDW 03/07/2016 14.9* 11.2 - 14.5 % Final  . lymph# 03/07/2016 1.3  0.9 - 3.3 10e3/uL Final  . MONO# 03/07/2016 0.3  0.1 - 0.9 10e3/uL Final  . Eosinophils Absolute 03/07/2016 0.0  0.0 - 0.5 10e3/uL Final  . Basophils Absolute 03/07/2016 0.0  0.0 - 0.1 10e3/uL Final  . NEUT% 03/07/2016 70.1  38.4 - 76.8 % Final  . LYMPH% 03/07/2016 22.9  14.0 - 49.7 % Final  . MONO% 03/07/2016 5.6  0.0 - 14.0 % Final  . EOS% 03/07/2016 0.6  0.0 - 7.0 %  Final  . BASO% 03/07/2016 0.8  0.0 - 2.0 % Final    . Sodium 03/07/2016 140  136 - 145 mEq/L Final  . Potassium 03/07/2016 4.4  3.5 - 5.1 mEq/L Final  . Chloride 03/07/2016 105  98 - 109 mEq/L Final  . CO2 03/07/2016 26  22 - 29 mEq/L Final  . Glucose 03/07/2016 94  70 - 140 mg/dl Final   Glucose reference range is for nonfasting patients. Fasting glucose reference range is 70- 100.  Marland Kitchen BUN 03/07/2016 25.0  7.0 - 26.0 mg/dL Final  . Creatinine 03/07/2016 1.2* 0.6 - 1.1 mg/dL Final  . Total Bilirubin 03/07/2016 0.36  0.20 - 1.20 mg/dL Final  . Alkaline Phosphatase 03/07/2016 78  40 - 150 U/L Final  . AST 03/07/2016 20  5 - 34 U/L Final  . ALT 03/07/2016 16  0 - 55 U/L Final  . Total Protein 03/07/2016 6.9  6.4 - 8.3 g/dL Final  . Albumin 03/07/2016 4.0  3.5 - 5.0 g/dL Final  . Calcium 03/07/2016 9.5  8.4 - 10.4 mg/dL Final  . Anion Gap 03/07/2016 9  3 - 11 mEq/L Final  . EGFR 03/07/2016 45* >90 ml/min/1.73 m2 Final   eGFR is calculated using the CKD-EPI Creatinine Equation (2009)  Appointment on 03/05/2016  Component Date Value Ref Range Status  . WBC 03/05/2016 4.9  3.9 - 10.3 10e3/uL Final  . NEUT# 03/05/2016 2.8  1.5 - 6.5 10e3/uL Final  . HGB 03/05/2016 12.5  11.6 - 15.9 g/dL Final  . HCT 03/05/2016 38.6  34.8 - 46.6 % Final  . Platelets 03/05/2016 289  145 - 400 10e3/uL Final  . MCV 03/05/2016 97.7  79.5 - 101.0 fL Final  . MCH 03/05/2016 31.6  25.1 - 34.0 pg Final  . MCHC 03/05/2016 32.4  31.5 - 36.0 g/dL Final  . RBC 03/05/2016 3.95  3.70 - 5.45 10e6/uL Final  . RDW 03/05/2016 14.1  11.2 - 14.5 % Final  . lymph# 03/05/2016 1.7  0.9 - 3.3 10e3/uL Final  . MONO# 03/05/2016 0.3  0.1 - 0.9 10e3/uL Final  . Eosinophils Absolute 03/05/2016 0.1  0.0 - 0.5 10e3/uL Final  . Basophils Absolute 03/05/2016 0.0  0.0 - 0.1 10e3/uL Final  . NEUT% 03/05/2016 56.5  38.4 - 76.8 % Final  . LYMPH% 03/05/2016 35.0  14.0 - 49.7 % Final  . MONO% 03/05/2016 6.0  0.0 - 14.0 % Final  . EOS% 03/05/2016 2.1  0.0 - 7.0 % Final  . BASO% 03/05/2016 0.4   0.0 - 2.0 % Final  . Sodium 03/05/2016 139  136 - 145 mEq/L Final  . Potassium 03/05/2016 5.1  3.5 - 5.1 mEq/L Final  . Chloride 03/05/2016 106  98 - 109 mEq/L Final  . CO2 03/05/2016 25  22 - 29 mEq/L Final  . Glucose 03/05/2016 96  70 - 140 mg/dl Final   Glucose reference range is for nonfasting patients. Fasting glucose reference range is 70- 100.  Marland Kitchen BUN 03/05/2016 23.2  7.0 - 26.0 mg/dL Final  . Creatinine 03/05/2016 1.1  0.6 - 1.1 mg/dL Final  . Total Bilirubin 03/05/2016 0.33  0.20 - 1.20 mg/dL Final  . Alkaline Phosphatase 03/05/2016 86  40 - 150 U/L Final  . AST 03/05/2016 15  5 - 34 U/L Final  . ALT 03/05/2016 13  0 - 55 U/L Final  . Total Protein 03/05/2016 7.5  6.4 - 8.3 g/dL Final  . Albumin 03/05/2016 4.1  3.5 -  5.0 g/dL Final  . Calcium 03/05/2016 10.2  8.4 - 10.4 mg/dL Final  . Anion Gap 03/05/2016 8  3 - 11 mEq/L Final  . EGFR 03/05/2016 49* >90 ml/min/1.73 m2 Final   eGFR is calculated using the CKD-EPI Creatinine Equation (2009)    RADIOGRAPHIC STUDIES: Dg Chest 2 View  03/07/2016  CLINICAL DATA:  75 year old female with productive cough and wheezing since last night. Currently undergoing chemo radiation for right lung non-small cell carcinoma stage II. Subsequent encounter. EXAM: CHEST  2 VIEW COMPARISON:  02/29/2016 and earlier. FINDINGS: Lung volumes are stable and within normal limits. No pneumothorax, pulmonary edema or pleural effusion. No acute pulmonary opacity. Stable cardiac size and mediastinal contours. Osteopenia. No acute osseous abnormality identified. Lower cervical ACDF hardware again noted. Abdominal aortic calcification again noted. IMPRESSION: Stable.  No acute cardiopulmonary abnormality. Calcified aortic atherosclerosis. Electronically Signed   By: Genevie Ann M.D.   On: 03/07/2016 12:02    ASSESSMENT/PLAN:    Cough Patient has been undergoing both chemotherapy and radiation treatments.  She reports development of a slightly congested cough since this  past Monday, 03/05/2016.  She denies any chest pain, chest pressure, shortness breath, or pain with inspiration.  She states that the cough is nonproductive.  She denies any recent fevers or chills.  Exam today reveals lung sounds clear bilaterally.  It was noted that patient's O2 sat had decreased down to 92% on room air.  Patient appeared in no acute respiratory distress, however.  All labs were within normal limits today; and chest x-ray revealed no acute findings.  Reviewed all details of today's visit with Dr. Julien Nordmann; he recommended patient training Medrol Dosepak in case the cough is associated with lung inflammation secondary to radiation treatments.  Advised patient there was a low threshold to start antibiotics if Medrol Dosepak does not improve her symptoms.  Patient was advised to call/return of electrical to the emergency department for any worsening symptoms whatsoever.  Non-small cell carcinoma of right lung, stage 2 ([place]) Patient received cycle 2 of her carboplatin/Taxol chemotherapy regimen on 03/05/2016.  She continues to receive radiation treatments as well.  Patient is scheduled to return on 03/12/2016 for labs and her next cycle of chemotherapy.    Patient stated understanding of all instructions; and was in agreement with this plan of care. The patient knows to call the clinic with any problems, questions or concerns.   Total time spent with patient was 25 minutes;  with greater than 75 percent of that time spent in face to face counseling regarding patient's symptoms,  and coordination of care and follow up.  Disclaimer:This dictation was prepared with Dragon/digital dictation along with [employer]. Any transcriptional errors that result from this process are unintentional.  Drue Second, NP 03/08/2016

## 2016-03-08 NOTE — Assessment & Plan Note (Signed)
Patient received cycle 2 of her carboplatin/Taxol chemotherapy regimen on 03/05/2016.  She continues to receive radiation treatments as well.  Patient is scheduled to return on 03/12/2016 for labs and her next cycle of chemotherapy.

## 2016-03-09 ENCOUNTER — Ambulatory Visit
Admission: RE | Admit: 2016-03-09 | Discharge: 2016-03-09 | Disposition: A | Discharge status: Home / Self Care | Payer: Medicare Other | Source: Ambulatory Visit | Attending: Radiation Oncology | Admitting: Radiation Oncology

## 2016-03-09 ENCOUNTER — Ambulatory Visit: Admission: RE | Admit: 2016-03-09 | Payer: Medicare Other | Source: Ambulatory Visit

## 2016-03-09 ENCOUNTER — Telehealth: Payer: Self-pay | Admitting: *Deleted

## 2016-03-09 NOTE — Telephone Encounter (Signed)
TC to pt to follow up from [facility] visit 4/26- pt reports she still has a cough but has improved a lot since her visit. She continues to take the steroids that were prescribed. She states she is able to rest at night without the cough waking her. She understands to call this clinic if her symptoms do not continue to improve. Pt denies any questions at this time.

## 2016-03-12 ENCOUNTER — Encounter: Payer: Self-pay | Admitting: Family Medicine

## 2016-03-12 ENCOUNTER — Ambulatory Visit
Admission: RE | Admit: 2016-03-12 | Discharge: 2016-03-12 | Disposition: A | Discharge status: Home / Self Care | Payer: Medicare Other | Source: Ambulatory Visit | Attending: Radiation Oncology | Admitting: Radiation Oncology

## 2016-03-12 ENCOUNTER — Ambulatory Visit (HOSPITAL_BASED_OUTPATIENT_CLINIC_OR_DEPARTMENT_OTHER): Payer: Medicare Other

## 2016-03-12 ENCOUNTER — Other Ambulatory Visit (HOSPITAL_BASED_OUTPATIENT_CLINIC_OR_DEPARTMENT_OTHER): Payer: Medicare Other

## 2016-03-12 VITALS — BP 131/81 | HR 93 | Temp 98.7°F | Resp 20

## 2016-03-12 DIAGNOSIS — Z5111 Encounter for antineoplastic chemotherapy: Secondary | ICD-10-CM

## 2016-03-12 DIAGNOSIS — C3491 Malignant neoplasm of unspecified part of right bronchus or lung: Secondary | ICD-10-CM | POA: Diagnosis not present

## 2016-03-12 DIAGNOSIS — Z510 Encounter for antineoplastic radiation therapy: Secondary | ICD-10-CM | POA: Diagnosis not present

## 2016-03-12 LAB — CBC WITH DIFFERENTIAL/PLATELET
BASO%: 0.7 % (ref 0.0–2.0)
Basophils Absolute: 0 10*3/uL (ref 0.0–0.1)
EOS%: 0.2 % (ref 0.0–7.0)
Eosinophils Absolute: 0 10*3/uL (ref 0.0–0.5)
HCT: 36.8 % (ref 34.8–46.6)
HGB: 11.8 g/dL (ref 11.6–15.9)
LYMPH%: 14.7 % (ref 14.0–49.7)
MCH: 30.9 pg (ref 25.1–34.0)
MCHC: 32.1 g/dL (ref 31.5–36.0)
MCV: 96.5 fL (ref 79.5–101.0)
MONO#: 0.2 10*3/uL (ref 0.1–0.9)
MONO%: 4.3 % (ref 0.0–14.0)
NEUT#: 4.4 10*3/uL (ref 1.5–6.5)
NEUT%: 80.1 % — ABNORMAL HIGH (ref 38.4–76.8)
Platelets: 246 10*3/uL (ref 145–400)
RBC: 3.81 10*6/uL (ref 3.70–5.45)
RDW: 15.3 % — ABNORMAL HIGH (ref 11.2–14.5)
WBC: 5.5 10*3/uL (ref 3.9–10.3)
lymph#: 0.8 10*3/uL — ABNORMAL LOW (ref 0.9–3.3)

## 2016-03-12 LAB — COMPREHENSIVE METABOLIC PANEL
ALT: 18 U/L (ref 0–55)
AST: 14 U/L (ref 5–34)
Albumin: 3.9 g/dL (ref 3.5–5.0)
Alkaline Phosphatase: 75 U/L (ref 40–150)
Anion Gap: 9 mEq/L (ref 3–11)
BUN: 26.1 mg/dL — ABNORMAL HIGH (ref 7.0–26.0)
CO2: 23 mEq/L (ref 22–29)
Calcium: 9.5 mg/dL (ref 8.4–10.4)
Chloride: 107 mEq/L (ref 98–109)
Creatinine: 1.2 mg/dL — ABNORMAL HIGH (ref 0.6–1.1)
EGFR: 47 mL/min/{1.73_m2} — ABNORMAL LOW (ref >90)
Glucose: 141 mg/dl — ABNORMAL HIGH (ref 70–140)
Potassium: 4.7 mEq/L (ref 3.5–5.1)
Sodium: 139 mEq/L (ref 136–145)
Total Bilirubin: 0.3 mg/dL (ref 0.20–1.20)
Total Protein: 6.8 g/dL (ref 6.4–8.3)

## 2016-03-12 MED ORDER — FAMOTIDINE IN NACL 20-0.9 MG/50ML-% IV SOLN
20.0000 mg | Freq: Once | INTRAVENOUS | Status: AC
  Administered 2016-03-12: 20 mg via INTRAVENOUS

## 2016-03-12 MED ORDER — DIPHENHYDRAMINE HCL 50 MG/ML IJ SOLN
INTRAMUSCULAR | Status: AC
  Filled 2016-03-12: qty 1

## 2016-03-12 MED ORDER — DIPHENHYDRAMINE HCL 50 MG/ML IJ SOLN
50.0000 mg | Freq: Once | INTRAMUSCULAR | Status: AC
  Administered 2016-03-12: 50 mg via INTRAVENOUS

## 2016-03-12 MED ORDER — SODIUM CHLORIDE 0.9 % IV SOLN
20.0000 mg | Freq: Once | INTRAVENOUS | Status: AC
  Administered 2016-03-12: 20 mg via INTRAVENOUS
  Filled 2016-03-12: qty 2

## 2016-03-12 MED ORDER — SODIUM CHLORIDE 0.9 % IV SOLN
Freq: Once | INTRAVENOUS | Status: AC
  Administered 2016-03-12: 13:00:00 via INTRAVENOUS

## 2016-03-12 MED ORDER — PALONOSETRON HCL INJECTION 0.25 MG/5ML
INTRAVENOUS | Status: AC
  Filled 2016-03-12: qty 5

## 2016-03-12 MED ORDER — PALONOSETRON HCL INJECTION 0.25 MG/5ML
0.2500 mg | Freq: Once | INTRAVENOUS | Status: AC
  Administered 2016-03-12: 0.25 mg via INTRAVENOUS

## 2016-03-12 MED ORDER — FAMOTIDINE IN NACL 20-0.9 MG/50ML-% IV SOLN
INTRAVENOUS | Status: AC
  Filled 2016-03-12: qty 50

## 2016-03-12 MED ORDER — SODIUM CHLORIDE 0.9 % IV SOLN
157.0000 mg | Freq: Once | INTRAVENOUS | Status: AC
  Administered 2016-03-12: 160 mg via INTRAVENOUS
  Filled 2016-03-12: qty 16

## 2016-03-12 MED ORDER — PACLITAXEL CHEMO INJECTION 300 MG/50ML
45.0000 mg/m2 | Freq: Once | INTRAVENOUS | Status: AC
  Administered 2016-03-12: 84 mg via INTRAVENOUS
  Filled 2016-03-12: qty 14

## 2016-03-12 NOTE — Patient Instructions (Signed)
[facility] Discharge Instructions for Patients Receiving Chemotherapy  Today you received the following chemotherapy agents Taxol and Carboplatin. To help prevent nausea and vomiting after your treatment, we encourage you to take your nausea medication as directed.  If you develop nausea and vomiting that is not controlled by your nausea medication, call the clinic.   BELOW ARE SYMPTOMS THAT SHOULD BE REPORTED IMMEDIATELY:  *FEVER GREATER THAN 100.5 F  *CHILLS WITH OR WITHOUT FEVER  NAUSEA AND VOMITING THAT IS NOT CONTROLLED WITH YOUR NAUSEA MEDICATION  *UNUSUAL SHORTNESS OF BREATH  *UNUSUAL BRUISING OR BLEEDING  TENDERNESS IN MOUTH AND THROAT WITH OR WITHOUT PRESENCE OF ULCERS  *URINARY PROBLEMS  *BOWEL PROBLEMS  UNUSUAL RASH Items with * indicate a potential emergency and should be followed up as soon as possible.  Feel free to call the clinic you have any questions or concerns. The clinic phone number is [phone].  Please show the CHEMO ALERT CARD at check-in to the Emergency Department and triage nurse.    

## 2016-03-13 ENCOUNTER — Telehealth: Payer: Self-pay | Admitting: Oncology

## 2016-03-13 ENCOUNTER — Encounter: Payer: Self-pay | Admitting: Radiation Oncology

## 2016-03-13 ENCOUNTER — Ambulatory Visit
Admission: RE | Admit: 2016-03-13 | Discharge: 2016-03-13 | Disposition: A | Discharge status: Home / Self Care | Payer: Medicare Other | Source: Ambulatory Visit | Attending: Radiation Oncology | Admitting: Radiation Oncology

## 2016-03-13 VITALS — BP 164/91 | HR 92 | Temp 99.6°F | Resp 18 | Ht 64.0 in | Wt 177.8 lb

## 2016-03-13 DIAGNOSIS — C3491 Malignant neoplasm of unspecified part of right bronchus or lung: Secondary | ICD-10-CM

## 2016-03-13 DIAGNOSIS — Z510 Encounter for antineoplastic radiation therapy: Secondary | ICD-10-CM | POA: Diagnosis not present

## 2016-03-13 MED ORDER — AZITHROMYCIN 250 MG PO TABS: 500.0000 mg | ORAL_TABLET | Freq: Every day | ORAL | Status: DC

## 2016-03-13 MED ORDER — AZITHROMYCIN 250 MG PO TABS: 250.0000 mg | ORAL_TABLET | Freq: Every day | ORAL | Status: DC

## 2016-03-13 NOTE — Progress Notes (Signed)
  Radiation Oncology         [phone] ________________________________  Name: Tammy Ball MRN: [id number]  Date: 03/13/2016  DOB: [birth year]  Weekly Radiation Therapy Management    ICD-9-CM ICD-10-CM   1. Non-small cell carcinoma of right lung, stage 2 (H[facility]) 162.9 C34.91 DISCONTINUED: azithromycin (ZITHROMAX) tablet 500 mg     DISCONTINUED: azithromycin (ZITHROMAX) tablet 250 mg    Current Dose: 16.2 Gy     Planned Dose:  50..4 Gy  Narrative . . . . . . . . The patient presents for routine under treatment assessment.                     The patient has completed 9 fractions to her right central chest. She denies having pain. She reports that she is still coughing and is coughing up yellow brown green tinged sputum. She said she finished her steroids this morning (prescribed by Dr. Julien Nordmann). She reports wheezing and her voice has changed. She denies having a sore throat or difficulty swallowing. She does report feeling short of breath. She denies having any skin irritation. She reports feeling fatigued in the afternoons. She had chemotherapy yesterday.                                 Set-up films were reviewed.                                 The chart was checked. Physical Findings. . .  height is '5\' 4"'$  (1.626 m) and weight is 177 lb 12.8 oz (80.65 kg). Her oral temperature is 99.6 F (37.6 C). Her blood pressure is 164/91 and her pulse is 92. Her respiration is 18 and oxygen saturation is 97%. . Weight essentially stable.  No significant changes. Lungs are clear to auscultation bilaterally. Heart has regular rate and rhythm. She is coughing up green/yellowish phlegm. Impression . . . . . . . The patient is tolerating radiation. She's not responding to steroids. She has a history of recurrent bronchitis/pneumonia. Plan . . . . . . . . . . . . Continue treatment as planned. I will place her on Zithromax. She is intolerant of Levaquin. Her pharmacy is [facility] [address],  [place], [place] [address].  ________________________________   Blair Promise, PhD, MD  This document serves as a record of services personally performed by Gery Pray, MD. It was created on his behalf by Darcus Austin, a trained medical scribe. The creation of this record is based on the scribe's personal observations and the provider's statements to them. This document has been checked and approved by the attending provider.

## 2016-03-13 NOTE — Progress Notes (Signed)
Tammy Ball has completed 9 fractions to her right central chest.  She denies having pain. She reports that she is still coughing and is bringing up yellow tinged sputum.  She said she finished her steroids this morning.  She reports she is wheezing and her voice has changed.  She denies having a sore throat or trouble swallowing.  She does report feeling short of breath.  She denies having any skin irritation.  She reports feeling fatigued in the afternoons.  She had chemotherapy yesterday.    BP 164/91 mmHg  Pulse 92  Temp(Src) 99.6 F (37.6 C) (Oral)  Resp 18  Ht '5\' 4"'$  (1.626 m)  Wt 177 lb 12.8 oz (80.65 kg)  BMI 30.50 kg/m2  SpO2 97%   Wt Readings from Last 3 Encounters:  03/13/16 177 lb 12.8 oz (80.65 kg)  03/07/16 176 lb 6.4 oz (80.015 kg)  03/06/16 178 lb 1.6 oz (80.786 kg)

## 2016-03-13 NOTE — Telephone Encounter (Signed)
Called in per Dr. Sondra Come to [facility] on [facility] - azithromycin (ZITHROMAX Z-PAK) 250 MG tablet.

## 2016-03-14 ENCOUNTER — Ambulatory Visit
Admission: RE | Admit: 2016-03-14 | Discharge: 2016-03-14 | Disposition: A | Discharge status: Home / Self Care | Payer: Medicare Other | Source: Ambulatory Visit | Attending: Radiation Oncology | Admitting: Radiation Oncology

## 2016-03-14 DIAGNOSIS — Z510 Encounter for antineoplastic radiation therapy: Secondary | ICD-10-CM | POA: Diagnosis not present

## 2016-03-14 DIAGNOSIS — C3491 Malignant neoplasm of unspecified part of right bronchus or lung: Secondary | ICD-10-CM | POA: Diagnosis not present

## 2016-03-15 ENCOUNTER — Ambulatory Visit
Admission: RE | Admit: 2016-03-15 | Discharge: 2016-03-15 | Disposition: A | Discharge status: Home / Self Care | Payer: Medicare Other | Source: Ambulatory Visit | Attending: Radiation Oncology | Admitting: Radiation Oncology

## 2016-03-15 DIAGNOSIS — Z510 Encounter for antineoplastic radiation therapy: Secondary | ICD-10-CM | POA: Diagnosis not present

## 2016-03-15 DIAGNOSIS — C3491 Malignant neoplasm of unspecified part of right bronchus or lung: Secondary | ICD-10-CM | POA: Diagnosis not present

## 2016-03-16 ENCOUNTER — Other Ambulatory Visit: Payer: Self-pay

## 2016-03-16 ENCOUNTER — Ambulatory Visit
Admission: RE | Admit: 2016-03-16 | Discharge: 2016-03-16 | Disposition: A | Discharge status: Home / Self Care | Payer: Medicare Other | Source: Ambulatory Visit | Attending: Radiation Oncology | Admitting: Radiation Oncology

## 2016-03-16 ENCOUNTER — Other Ambulatory Visit: Payer: Self-pay | Admitting: Family Medicine

## 2016-03-16 ENCOUNTER — Encounter: Payer: Self-pay | Admitting: Radiation Oncology

## 2016-03-16 VITALS — BP 112/55 | HR 101 | Temp 98.7°F | Resp 20

## 2016-03-16 DIAGNOSIS — Z510 Encounter for antineoplastic radiation therapy: Secondary | ICD-10-CM | POA: Diagnosis not present

## 2016-03-16 DIAGNOSIS — J209 Acute bronchitis, unspecified: Secondary | ICD-10-CM

## 2016-03-16 DIAGNOSIS — C3491 Malignant neoplasm of unspecified part of right bronchus or lung: Secondary | ICD-10-CM

## 2016-03-16 MED ORDER — SUCRALFATE 1 G PO TABS: 1.0000 g | ORAL_TABLET | Freq: Three times a day (TID) | ORAL | Status: DC

## 2016-03-16 NOTE — Progress Notes (Addendum)
Patient came to nursing c/o difficulty swallowing foods, and no energy,  Past few days stated, 12/, drinking water okay but hurts going down stated, appetite poor,  Had an ensure for breakfast, 12/28 txs radaition to right central chest, radiaplex bid, no skin changing  No itching 11:53 AM BP 112/55 mmHg  Pulse 101  Temp(Src) 98.7 F (37.1 C) (Oral)  Resp 20  SpO2 99%  Wt Readings from Last 3 Encounters:  03/13/16 177 lb 12.8 oz (80.65 kg)  03/07/16 176 lb 6.4 oz (80.015 kg)  03/06/16 178 lb 1.6 oz (80.786 kg)

## 2016-03-17 ENCOUNTER — Other Ambulatory Visit: Payer: Self-pay | Admitting: Family Medicine

## 2016-03-19 ENCOUNTER — Ambulatory Visit
Admission: RE | Admit: 2016-03-19 | Discharge: 2016-03-19 | Disposition: A | Discharge status: Home / Self Care | Payer: Medicare Other | Source: Ambulatory Visit | Attending: Radiation Oncology | Admitting: Radiation Oncology

## 2016-03-19 ENCOUNTER — Ambulatory Visit (HOSPITAL_BASED_OUTPATIENT_CLINIC_OR_DEPARTMENT_OTHER): Payer: Medicare Other

## 2016-03-19 ENCOUNTER — Other Ambulatory Visit (HOSPITAL_BASED_OUTPATIENT_CLINIC_OR_DEPARTMENT_OTHER): Payer: Medicare Other

## 2016-03-19 ENCOUNTER — Encounter: Payer: Self-pay | Admitting: *Deleted

## 2016-03-19 ENCOUNTER — Encounter: Payer: Self-pay | Admitting: Internal Medicine

## 2016-03-19 ENCOUNTER — Ambulatory Visit (HOSPITAL_BASED_OUTPATIENT_CLINIC_OR_DEPARTMENT_OTHER): Payer: Medicare Other | Admitting: Internal Medicine

## 2016-03-19 VITALS — BP 158/67 | HR 103 | Temp 99.0°F | Resp 18 | Ht 64.0 in | Wt 174.0 lb

## 2016-03-19 VITALS — HR 100

## 2016-03-19 DIAGNOSIS — C3411 Malignant neoplasm of upper lobe, right bronchus or lung: Secondary | ICD-10-CM

## 2016-03-19 DIAGNOSIS — C771 Secondary and unspecified malignant neoplasm of intrathoracic lymph nodes: Secondary | ICD-10-CM

## 2016-03-19 DIAGNOSIS — C3491 Malignant neoplasm of unspecified part of right bronchus or lung: Secondary | ICD-10-CM

## 2016-03-19 DIAGNOSIS — Z510 Encounter for antineoplastic radiation therapy: Secondary | ICD-10-CM | POA: Diagnosis not present

## 2016-03-19 DIAGNOSIS — Z5111 Encounter for antineoplastic chemotherapy: Secondary | ICD-10-CM | POA: Diagnosis not present

## 2016-03-19 DIAGNOSIS — T451X5A Adverse effect of antineoplastic and immunosuppressive drugs, initial encounter: Secondary | ICD-10-CM

## 2016-03-19 DIAGNOSIS — G620 Drug-induced polyneuropathy: Secondary | ICD-10-CM

## 2016-03-19 LAB — CBC WITH DIFFERENTIAL/PLATELET
BASO%: 0.8 % (ref 0.0–2.0)
Basophils Absolute: 0 10*3/uL (ref 0.0–0.1)
EOS%: 0.7 % (ref 0.0–7.0)
Eosinophils Absolute: 0 10*3/uL (ref 0.0–0.5)
HCT: 35.7 % (ref 34.8–46.6)
HGB: 11.5 g/dL — ABNORMAL LOW (ref 11.6–15.9)
LYMPH%: 28.1 % (ref 14.0–49.7)
MCH: 30.7 pg (ref 25.1–34.0)
MCHC: 32.2 g/dL (ref 31.5–36.0)
MCV: 95.3 fL (ref 79.5–101.0)
MONO#: 0.3 10*3/uL (ref 0.1–0.9)
MONO%: 8.8 % (ref 0.0–14.0)
NEUT#: 1.8 10*3/uL (ref 1.5–6.5)
NEUT%: 61.6 % (ref 38.4–76.8)
Platelets: 182 10*3/uL (ref 145–400)
RBC: 3.75 10*6/uL (ref 3.70–5.45)
RDW: 15.4 % — ABNORMAL HIGH (ref 11.2–14.5)
WBC: 2.9 10*3/uL — ABNORMAL LOW (ref 3.9–10.3)
lymph#: 0.8 10*3/uL — ABNORMAL LOW (ref 0.9–3.3)

## 2016-03-19 LAB — COMPREHENSIVE METABOLIC PANEL
ALT: 20 U/L (ref 0–55)
AST: 15 U/L (ref 5–34)
Albumin: 3.8 g/dL (ref 3.5–5.0)
Alkaline Phosphatase: 72 U/L (ref 40–150)
Anion Gap: 9 mEq/L (ref 3–11)
BUN: 22.6 mg/dL (ref 7.0–26.0)
CO2: 23 mEq/L (ref 22–29)
Calcium: 9.8 mg/dL (ref 8.4–10.4)
Chloride: 107 mEq/L (ref 98–109)
Creatinine: 1 mg/dL (ref 0.6–1.1)
EGFR: 57 mL/min/{1.73_m2} — ABNORMAL LOW (ref >90)
Glucose: 97 mg/dl (ref 70–140)
Potassium: 4.8 mEq/L (ref 3.5–5.1)
Sodium: 139 mEq/L (ref 136–145)
Total Bilirubin: 0.36 mg/dL (ref 0.20–1.20)
Total Protein: 6.9 g/dL (ref 6.4–8.3)

## 2016-03-19 MED ORDER — SODIUM CHLORIDE 0.9 % IV SOLN
Freq: Once | INTRAVENOUS | Status: AC
  Administered 2016-03-19: 13:00:00 via INTRAVENOUS

## 2016-03-19 MED ORDER — FAMOTIDINE IN NACL 20-0.9 MG/50ML-% IV SOLN
INTRAVENOUS | Status: AC
  Filled 2016-03-19: qty 50

## 2016-03-19 MED ORDER — HEPARIN SOD (PORK) LOCK FLUSH 100 UNIT/ML IV SOLN
500.0000 [IU] | Freq: Once | INTRAVENOUS | Status: DC | PRN
  Filled 2016-03-19: qty 5

## 2016-03-19 MED ORDER — FAMOTIDINE IN NACL 20-0.9 MG/50ML-% IV SOLN
20.0000 mg | Freq: Once | INTRAVENOUS | Status: AC
  Administered 2016-03-19: 20 mg via INTRAVENOUS

## 2016-03-19 MED ORDER — SODIUM CHLORIDE 0.9 % IV SOLN
45.0000 mg/m2 | Freq: Once | INTRAVENOUS | Status: AC
  Administered 2016-03-19: 84 mg via INTRAVENOUS
  Filled 2016-03-19: qty 14

## 2016-03-19 MED ORDER — SODIUM CHLORIDE 0.9 % IV SOLN
160.0000 mg | Freq: Once | INTRAVENOUS | Status: AC
  Administered 2016-03-19: 160 mg via INTRAVENOUS
  Filled 2016-03-19: qty 16

## 2016-03-19 MED ORDER — MOMETASONE FURO-FORMOTEROL FUM 200-5 MCG/ACT IN AERO: 2.0000 | INHALATION_SPRAY | Freq: Two times a day (BID) | RESPIRATORY_TRACT | Status: DC

## 2016-03-19 MED ORDER — SODIUM CHLORIDE 0.9% FLUSH
10.0000 mL | INTRAVENOUS | Status: DC | PRN
  Filled 2016-03-19: qty 10

## 2016-03-19 MED ORDER — DIPHENHYDRAMINE HCL 50 MG/ML IJ SOLN
50.0000 mg | Freq: Once | INTRAMUSCULAR | Status: AC
  Administered 2016-03-19: 50 mg via INTRAVENOUS

## 2016-03-19 MED ORDER — DIPHENHYDRAMINE HCL 50 MG/ML IJ SOLN
INTRAMUSCULAR | Status: AC
  Filled 2016-03-19: qty 1

## 2016-03-19 MED ORDER — PALONOSETRON HCL INJECTION 0.25 MG/5ML
0.2500 mg | Freq: Once | INTRAVENOUS | Status: AC
  Administered 2016-03-19: 0.25 mg via INTRAVENOUS

## 2016-03-19 MED ORDER — PALONOSETRON HCL INJECTION 0.25 MG/5ML
INTRAVENOUS | Status: AC
  Filled 2016-03-19: qty 5

## 2016-03-19 MED ORDER — DEXAMETHASONE SODIUM PHOSPHATE 100 MG/10ML IJ SOLN
20.0000 mg | Freq: Once | INTRAMUSCULAR | Status: AC
  Administered 2016-03-19: 20 mg via INTRAVENOUS
  Filled 2016-03-19: qty 2

## 2016-03-19 NOTE — Progress Notes (Signed)
Department of Radiation Oncology  Phone:  [phone] Fax:        [phone]  Weekly Treatment Note    Name: Tammy Ball Date: 03/19/2016 MRN: [id number] DOB: [birth year]   Diagnosis:     ICD-9-CM ICD-10-CM   1. Non-small cell carcinoma of right lung, stage 2 (HCC) 162.9 C34.91      Current dose: 21.6 Gy  Current fraction: 12   MEDICATIONS: Current Outpatient Prescriptions  Medication Sig Dispense Refill  . aspirin EC 81 MG tablet Take 81 mg by mouth daily.    Marland Kitchen atorvastatin (LIPITOR) 40 MG tablet TAKE ONE TABLET BY MOUTH ONCE DAILY. (Patient taking differently: Take 40 mg by mouth daily. ) 90 tablet 3  . calcium carbonate (OSCAL) 1500 (600 Ca) MG TABS tablet Take by mouth 2 (two) times daily with a meal.    . fish oil-omega-3 fatty acids 1000 MG capsule Take 1 capsule by mouth daily.     . hyaluronate sodium (RADIAPLEXRX) GEL Apply 1 application topically 2 (two) times daily.    . methylPREDNISolone (MEDROL DOSEPAK) 4 MG TBPK tablet Medrol dose pak: take as directed. 21 tablet 0  . mometasone-formoterol (DULERA) 200-5 MCG/ACT AERO Inhale 2 puffs into the lungs 2 (two) times daily. 3 Inhaler 0  . polyvinyl alcohol-povidone (REFRESH) 1.4-0.6 % ophthalmic solution Place 2 drops into both eyes daily as needed (for dry eyes).    . valsartan (DIOVAN) 80 MG tablet Take 1 tablet (80 mg total) by mouth daily. 90 tablet 0  . acetaminophen (TYLENOL) 500 MG tablet Take 1,000 mg by mouth every 6 (six) hours as needed for mild pain, moderate pain, fever or headache. Reported on 03/16/2016    . albuterol (PROVENTIL HFA;VENTOLIN HFA) 108 (90 BASE) MCG/ACT inhaler Inhale 2 puffs into the lungs every 4 (four) hours as needed for wheezing or shortness of breath. (Patient not taking: Reported on 03/16/2016) 1 Inhaler 0  . azithromycin (ZITHROMAX Z-PAK) 250 MG tablet Take by mouth daily. Called in to [place] on [facility] per Dr. Sondra Come on 03/13/16    . benzonatate (TESSALON) 200 MG capsule Take 1  capsule (200 mg total) by mouth 3 (three) times daily as needed for cough. (Patient not taking: Reported on 03/16/2016) 20 capsule 0  . fluticasone (FLONASE) 50 MCG/ACT nasal spray Place 2 sprays into both nostrils daily. (Patient not taking: Reported on 03/07/2016) 9.9 g 0  . LORazepam (ATIVAN) 0.5 MG tablet Take 1 tablet (0.5 mg total) by mouth 2 (two) times daily as needed for anxiety. (Patient not taking: Reported on 03/07/2016) 10 tablet 1  . oxyCODONE-acetaminophen (PERCOCET/ROXICET) 5-325 MG tablet Take 1-2 tablets by mouth every 6 (six) hours as needed for severe pain. (Patient not taking: Reported on 03/05/2016) 30 tablet 0  . pseudoephedrine-guaifenesin (MUCINEX D) 60-600 MG 12 hr tablet Take 1 tablet by mouth every 12 (twelve) hours. Reported on 03/16/2016    . sucralfate (CARAFATE) 1 g tablet Take 1 tablet (1 g total) by mouth 4 (four) times daily -  with meals and at bedtime. Mix in a few teaspoons of water as a slurry 120 tablet 2  . traMADol (ULTRAM) 50 MG tablet Reported on 03/16/2016    . UNABLE TO FIND Med Name: [place] Felt (Patient not taking: Reported on 03/16/2016) 1 each 0   No current facility-administered medications for this encounter.     ALLERGIES: Bee venom; Amlodipine; Levofloxacin; and Hctz   LABORATORY DATA:  Lab Results  Component Value Date  WBC 5.5 03/12/2016   HGB 11.8 03/12/2016   HCT 36.8 03/12/2016   MCV 96.5 03/12/2016   PLT 246 03/12/2016   Lab Results  Component Value Date   NA 139 03/12/2016   K 4.7 03/12/2016   CL 98* 01/23/2016   CO2 23 03/12/2016   Lab Results  Component Value Date   ALT 18 03/12/2016   AST 14 03/12/2016   ALKPHOS 75 03/12/2016   BILITOT 0.30 03/12/2016     NARRATIVE: Tammy Ball was seen today for weekly treatment management. The chart was checked and the patient's films were reviewed.  Patient came to nursing c/o difficulty swallowing foods, and no energy,  Past few days stated, 12/, drinking water okay but hurts going down  stated, appetite poor,  Had an ensure for breakfast, 12/28 txs radaition to right central chest, radiaplex bid, no skin changing  No itching 6:22 AM BP 112/55 mmHg  Pulse 101  Temp(Src) 98.7 F (37.1 C) (Oral)  Resp 20  SpO2 99%  Wt Readings from Last 3 Encounters:  03/13/16 177 lb 12.8 oz (80.65 kg)  03/07/16 176 lb 6.4 oz (80.015 kg)  03/06/16 178 lb 1.6 oz (80.786 kg)    PHYSICAL EXAMINATION: oral temperature is 98.7 F (37.1 C). Her blood pressure is 112/55 and her pulse is 101. Her respiration is 20 and oxygen saturation is 99%.        ASSESSMENT: The patient is doing satisfactorily with treatment.   The patient is complaining of some worsening esophagitis. A prescription for Carafate has been called in for the patient.  PLAN: We will continue with the patient's radiation treatment as planned.

## 2016-03-19 NOTE — Progress Notes (Signed)
Tammy Ball [phone]   Fax:([phone]) [place], MD [address] [place] [address]  DIAGNOSIS: Stage IIIA (T2a, N2, M0) non-small cell lung cancer, squamous cell carcinoma presented with large right upper lobe lung mass with right hilar involvement diagnosed in November 2016.  PRIOR THERAPY:  1) Neoadjuvant systemic chemotherapy with carboplatin for AUC of 6 and paclitaxel 200 MG/M2 every 3 weeks, status post 3 cycles with partial response. 2) Video bronchoscopy, right video-assisted thoracoscopy, minithoracotomy, right upper lobectomy with lymph node dissection on 01/18/2016. The final pathology revealed residual squamous cell carcinoma measuring 3.1 cm with close resection margin and metastatic carcinoma to lymph nodes at levels 10R, 12R and 4R. (ypT2a, yN2).   CURRENT THERAPY: Concurrent chemoradiation with weekly carboplatin for AUC of 2 and paclitaxel 45 MG/M2. First dose 02/27/2016. Status post 3 cycles  INTERVAL HISTORY: Tammy Ball 75 y.o. female returns to the clinic today for follow-up visit. She is currently undergoing concurrent chemoradiation with weekly carboplatin and paclitaxel is status post 3 cycles. She tolerated the first week of her treatment fairly well with no significant adverse effects. She was treated recently for upper respiratory infection and she is feeling much better today. She continues to have mild fatigue. She denied having any significant weight loss or night sweats. She denied having any nausea or vomiting, no fever or chills. She has no significant shortness of breath, cough or hemoptysis. She had mild odynophagia and the patient was started by Dr. Sondra Come on Carafate. She is here today for evaluation before starting cycle #4.  MEDICAL HISTORY: Past Medical History  Diagnosis Date  . COPD (chronic obstructive pulmonary disease) ([place])   . Hypertension   . Depression   . Asthma     . Glaucoma   . Hyperlipidemia   . Heart murmur   . Non-small cell carcinoma of right lung, stage 2 ([place]) 10/03/2015  . Chemotherapy-induced neuropathy ([place]) 11/01/2015  . History of nuclear stress test     Myoview 2/17: no ischemia or scar, EF 79%; low risk  . History of echocardiogram     Echo 2/17: EF 38-75%, grade 1 diastolic dysfunction, mild MR, trivial pericardial effusion  . Renal cell carcinoma ([place])     L nephrectomy  in 2012  . Anxiety   . Claustrophobia   . Headache     prior to menopause  . Anemia     was while doing chemo  . Encounter for antineoplastic chemotherapy 02/09/2016    ALLERGIES:  is allergic to bee venom; amlodipine; levofloxacin; and hctz.  MEDICATIONS:  Current Outpatient Prescriptions  Medication Sig Dispense Refill  . acetaminophen (TYLENOL) 500 MG tablet Take 1,000 mg by mouth every 6 (six) hours as needed for mild pain, moderate pain, fever or headache. Reported on 03/16/2016    . albuterol (PROVENTIL HFA;VENTOLIN HFA) 108 (90 BASE) MCG/ACT inhaler Inhale 2 puffs into the lungs every 4 (four) hours as needed for wheezing or shortness of breath. 1 Inhaler 0  . aspirin EC 81 MG tablet Take 81 mg by mouth daily.    Marland Kitchen atorvastatin (LIPITOR) 40 MG tablet TAKE ONE TABLET BY MOUTH ONCE DAILY. (Patient taking differently: Take 40 mg by mouth daily. ) 90 tablet 3  . azithromycin (ZITHROMAX Z-PAK) 250 MG tablet Take by mouth daily. Called in to [place] on [facility] per Dr. Sondra Come on 03/13/16    . benzonatate (TESSALON) 200 MG capsule Take 1  capsule (200 mg total) by mouth 3 (three) times daily as needed for cough. 20 capsule 0  . calcium carbonate (OSCAL) 1500 (600 Ca) MG TABS tablet Take by mouth 2 (two) times daily with a meal.    . fish oil-omega-3 fatty acids 1000 MG capsule Take 1 capsule by mouth daily.     . fluticasone (FLONASE) 50 MCG/ACT nasal spray Place 2 sprays into both nostrils daily. 9.9 g 0  . hyaluronate sodium (RADIAPLEXRX) GEL Apply 1  application topically 2 (two) times daily.    Marland Kitchen LORazepam (ATIVAN) 0.5 MG tablet Take 1 tablet (0.5 mg total) by mouth 2 (two) times daily as needed for anxiety. 10 tablet 1  . methylPREDNISolone (MEDROL DOSEPAK) 4 MG TBPK tablet Medrol dose pak: take as directed. 21 tablet 0  . mometasone-formoterol (DULERA) 200-5 MCG/ACT AERO Inhale 2 puffs into the lungs 2 (two) times daily. 3 Inhaler 0  . oxyCODONE-acetaminophen (PERCOCET/ROXICET) 5-325 MG tablet Take 1-2 tablets by mouth every 6 (six) hours as needed for severe pain. 30 tablet 0  . polyvinyl alcohol-povidone (REFRESH) 1.4-0.6 % ophthalmic solution Place 2 drops into both eyes daily as needed (for dry eyes).    . pseudoephedrine-guaifenesin (MUCINEX D) 60-600 MG 12 hr tablet Take 1 tablet by mouth every 12 (twelve) hours. Reported on 03/16/2016    . sucralfate (CARAFATE) 1 g tablet Take 1 tablet (1 g total) by mouth 4 (four) times daily -  with meals and at bedtime. Mix in a few teaspoons of water as a slurry 120 tablet 2  . traMADol (ULTRAM) 50 MG tablet Reported on 03/16/2016    . UNABLE TO FIND Med Name: Wig 1 each 0  . valsartan (DIOVAN) 80 MG tablet Take 1 tablet (80 mg total) by mouth daily. 90 tablet 0   No current facility-administered medications for this visit.    SURGICAL HISTORY:  Past Surgical History  Procedure Laterality Date  . Nephrectomy    . Kidney cancer    . Tubal ligation    . Spine surgery    . Eye surgery    . Video bronchoscopy Bilateral 09/20/2015    Procedure: VIDEO BRONCHOSCOPY WITHOUT FLUORO;  Surgeon: Rigoberto Noel, MD;  Location: [facility];  Service: Cardiopulmonary;  Laterality: Bilateral;  . Back surgery      cervical 1991  . Video bronchoscopy N/A 01/18/2016    Procedure: VIDEO BRONCHOSCOPY;  Surgeon: [name], MD;  Location: [place];  Service: Thoracic;  Laterality: N/A;  . Video assisted thoracoscopy (vats)/wedge resection Right 01/18/2016    Procedure: VIDEO ASSISTED THORACOSCOPY (VATS)/LUNG  RESECTION, THOROCOTOMY, RIGHT UPPER LOBECTOMY, LYMPH NODE DISSECTION, PLACEMENT OF ON Q;  Surgeon: [name], MD;  Location: [place];  Service: Thoracic;  Laterality: Right;    REVIEW OF SYSTEMS:  A comprehensive review of systems was negative except for: Constitutional: positive for fatigue Neurological: positive for paresthesia   PHYSICAL EXAMINATION: General appearance: alert, cooperative, fatigued and no distress Head: Normocephalic, without obvious abnormality, atraumatic Neck: no adenopathy, no JVD, supple, symmetrical, trachea midline and thyroid not enlarged, symmetric, no tenderness/mass/nodules Lymph nodes: Cervical, supraclavicular, and axillary nodes normal. Resp: clear to auscultation bilaterally Back: symmetric, no curvature. ROM normal. No CVA tenderness. Cardio: regular rate and rhythm, S1, S2 normal, no murmur, click, rub or gallop GI: soft, non-tender; bowel sounds normal; no masses,  no organomegaly Extremities: extremities normal, atraumatic, no cyanosis or edema Neurologic: Alert and oriented X 3, normal strength and tone. Normal symmetric  reflexes. Normal coordination and gait  ECOG PERFORMANCE STATUS: 1 - Symptomatic but completely ambulatory  Blood pressure 158/67, pulse 103, temperature 99 F (37.2 C), temperature source Oral, resp. rate 18, height '5\' 4"'$  (1.626 m), weight 174 lb (78.926 kg), SpO2 95 %.  LABORATORY DATA: Lab Results  Component Value Date   WBC 2.9* 03/19/2016   HGB 11.5* 03/19/2016   HCT 35.7 03/19/2016   MCV 95.3 03/19/2016   PLT 182 03/19/2016      Chemistry      Component Value Date/Time   NA 139 03/19/2016 1053   NA 137 01/23/2016 0301   K 4.8 03/19/2016 1053   K 4.5 01/23/2016 0301   CL 98* 01/23/2016 0301   CO2 23 03/19/2016 1053   CO2 33* 01/23/2016 0301   BUN 22.6 03/19/2016 1053   BUN 20 01/23/2016 0301   BUN 24* 12/11/2011   CREATININE 1.0 03/19/2016 1053   CREATININE 1.03* 01/23/2016 0301   CREATININE 1.08  12/02/2013 0911   GLU 105 12/10/2009      Component Value Date/Time   CALCIUM 9.8 03/19/2016 1053   CALCIUM 9.1 01/23/2016 0301   ALKPHOS 72 03/19/2016 1053   ALKPHOS 61 01/20/2016 0425   AST 15 03/19/2016 1053   AST 22 01/20/2016 0425   ALT 20 03/19/2016 1053   ALT 15 01/20/2016 0425   BILITOT 0.36 03/19/2016 1053   BILITOT 0.3 01/20/2016 0425       RADIOGRAPHIC STUDIES: Dg Chest 2 View  03/07/2016  CLINICAL DATA:  75 year old female with productive cough and wheezing since last night. Currently undergoing chemo radiation for right lung non-small cell carcinoma stage II. Subsequent encounter. EXAM: CHEST  2 VIEW COMPARISON:  02/29/2016 and earlier. FINDINGS: Lung volumes are stable and within normal limits. No pneumothorax, pulmonary edema or pleural effusion. No acute pulmonary opacity. Stable cardiac size and mediastinal contours. Osteopenia. No acute osseous abnormality identified. Lower cervical ACDF hardware again noted. Abdominal aortic calcification again noted. IMPRESSION: Stable.  No acute cardiopulmonary abnormality. Calcified aortic atherosclerosis. Electronically Signed   By: Genevie Ann M.D.   On: 03/07/2016 12:02   Dg Chest 2 View  02/29/2016  CLINICAL DATA:  Hx of malignant neoplasm of lung Hx of Rt VATS 01/18/2016 Hx of renal ca Stat reading, pt gone to office now for appt EXAM: CHEST - 2 VIEW COMPARISON:  02/09/2016 FINDINGS: Lungs are clear. Heart size and mediastinal contours are within normal limits. Surgical clips near the right hilum. Atheromatous aorta. No effusion. No pneumothorax. Cervical fixation hardware noted. Visualized skeletal structures otherwise unremarkable. IMPRESSION: No acute cardiopulmonary disease. Electronically Signed   By: Lucrezia Europe M.D.   On: 02/29/2016 14:21    ASSESSMENT AND PLAN: This is a very pleasant 75 years old white female recently diagnosed with stage II a non-small cell lung cancer, squamous cell carcinoma and completed neoadjuvant  systemic chemotherapy with carboplatin and paclitaxel status post 3 cycles.  The recent CT scan of the chest showed partial response response with significant decrease in the size of the central right upper lobe pulmonary nodule and increased cavitation.  The patient underwent right upper lobectomy with lymph node dissection that showed residual tumor in addition to metastatic disease to the mediastinal lymph nodes and close resection margin. The patient was started on a course of concurrent chemoradiation with weekly carboplatin and paclitaxel status post 3 cycles. She tolerated the first 3 cycles well. I recommended for her to proceed with cycle #4 today as a scheduled.  She would come back for follow-up visit in 2 weeks for evaluation and management of any adverse effect of her treatment. She was advised to call immediately if she has any concerning symptoms in the interval. The patient voices understanding of current disease status and treatment options and is in agreement with the current care plan.  All questions were answered. The patient knows to call the clinic with any problems, questions or concerns. We can certainly see the patient much sooner if necessary.  Disclaimer: This note was dictated with voice recognition software. Similar sounding words can inadvertently be transcribed and may not be corrected upon review.

## 2016-03-19 NOTE — Addendum Note (Signed)
Addended by: Jannette Spanner on: 03/19/2016 09:03 AM   Modules accepted: Orders

## 2016-03-19 NOTE — Patient Instructions (Signed)
[facility] Discharge Instructions for Patients Receiving Chemotherapy  Today you received the following chemotherapy agents Taxol and Carboplatin. To help prevent nausea and vomiting after your treatment, we encourage you to take your nausea medication as directed.  If you develop nausea and vomiting that is not controlled by your nausea medication, call the clinic.   BELOW ARE SYMPTOMS THAT SHOULD BE REPORTED IMMEDIATELY:  *FEVER GREATER THAN 100.5 F  *CHILLS WITH OR WITHOUT FEVER  NAUSEA AND VOMITING THAT IS NOT CONTROLLED WITH YOUR NAUSEA MEDICATION  *UNUSUAL SHORTNESS OF BREATH  *UNUSUAL BRUISING OR BLEEDING  TENDERNESS IN MOUTH AND THROAT WITH OR WITHOUT PRESENCE OF ULCERS  *URINARY PROBLEMS  *BOWEL PROBLEMS  UNUSUAL RASH Items with * indicate a potential emergency and should be followed up as soon as possible.  Feel free to call the clinic you have any questions or concerns. The clinic phone number is [phone].  Please show the CHEMO ALERT CARD at check-in to the Emergency Department and triage nurse.    

## 2016-03-19 NOTE — Progress Notes (Signed)
Oncology Nurse Navigator Documentation  Oncology Nurse Navigator Flowsheets 03/19/2016  Navigator Location [facility]Med On[facility]  Navigator Encounter Type Clinic/MDC  Patient Visit Type MedOnc  Treatment Phase Treatment  Barriers/Navigation Needs Education  Education Other  Interventions Education Method  Education Method Verbal  Acuity Level 2  Acuity Level 2 Educational needs  Time Spent with Patient 30   Spoke with patient today.  She is receiving tx today.  I updated her on signs and symptoms to call if develop.

## 2016-03-20 ENCOUNTER — Ambulatory Visit
Admission: RE | Admit: 2016-03-20 | Discharge: 2016-03-20 | Disposition: A | Discharge status: Home / Self Care | Payer: Medicare Other | Source: Ambulatory Visit | Attending: Radiation Oncology | Admitting: Radiation Oncology

## 2016-03-20 ENCOUNTER — Inpatient Hospital Stay
Admission: RE | Admit: 2016-03-20 | Discharge: 2016-03-20 | Disposition: A | Discharge status: Home / Self Care | Payer: Medicare Other | Source: Ambulatory Visit | Attending: Radiation Oncology | Admitting: Radiation Oncology

## 2016-03-20 DIAGNOSIS — C3491 Malignant neoplasm of unspecified part of right bronchus or lung: Secondary | ICD-10-CM | POA: Diagnosis not present

## 2016-03-20 DIAGNOSIS — Z510 Encounter for antineoplastic radiation therapy: Secondary | ICD-10-CM | POA: Diagnosis not present

## 2016-03-21 ENCOUNTER — Ambulatory Visit
Admission: RE | Admit: 2016-03-21 | Discharge: 2016-03-21 | Disposition: A | Discharge status: Home / Self Care | Payer: Medicare Other | Source: Ambulatory Visit | Attending: Radiation Oncology | Admitting: Radiation Oncology

## 2016-03-21 DIAGNOSIS — Z510 Encounter for antineoplastic radiation therapy: Secondary | ICD-10-CM | POA: Diagnosis not present

## 2016-03-21 DIAGNOSIS — C3491 Malignant neoplasm of unspecified part of right bronchus or lung: Secondary | ICD-10-CM | POA: Diagnosis not present

## 2016-03-22 ENCOUNTER — Encounter: Payer: Self-pay | Admitting: Family Medicine

## 2016-03-22 ENCOUNTER — Other Ambulatory Visit: Payer: Self-pay | Admitting: Family Medicine

## 2016-03-22 ENCOUNTER — Ambulatory Visit
Admission: RE | Admit: 2016-03-22 | Discharge: 2016-03-22 | Disposition: A | Discharge status: Home / Self Care | Payer: Medicare Other | Source: Ambulatory Visit | Attending: Radiation Oncology | Admitting: Radiation Oncology

## 2016-03-22 DIAGNOSIS — Z510 Encounter for antineoplastic radiation therapy: Secondary | ICD-10-CM | POA: Diagnosis not present

## 2016-03-22 DIAGNOSIS — C3491 Malignant neoplasm of unspecified part of right bronchus or lung: Secondary | ICD-10-CM | POA: Diagnosis not present

## 2016-03-22 MED ORDER — FLUTICASONE-SALMETEROL 100-50 MCG/DOSE IN AEPB: 1.0000 | INHALATION_SPRAY | Freq: Two times a day (BID) | RESPIRATORY_TRACT | Status: DC

## 2016-03-23 ENCOUNTER — Ambulatory Visit
Admission: RE | Admit: 2016-03-23 | Discharge: 2016-03-23 | Disposition: A | Discharge status: Home / Self Care | Payer: Medicare Other | Source: Ambulatory Visit | Attending: Radiation Oncology | Admitting: Radiation Oncology

## 2016-03-23 DIAGNOSIS — C3491 Malignant neoplasm of unspecified part of right bronchus or lung: Secondary | ICD-10-CM | POA: Diagnosis not present

## 2016-03-23 DIAGNOSIS — Z510 Encounter for antineoplastic radiation therapy: Secondary | ICD-10-CM | POA: Diagnosis not present

## 2016-03-26 ENCOUNTER — Ambulatory Visit
Admission: RE | Admit: 2016-03-26 | Discharge: 2016-03-26 | Disposition: A | Discharge status: Home / Self Care | Payer: Medicare Other | Source: Ambulatory Visit | Attending: Radiation Oncology | Admitting: Radiation Oncology

## 2016-03-26 ENCOUNTER — Ambulatory Visit (HOSPITAL_BASED_OUTPATIENT_CLINIC_OR_DEPARTMENT_OTHER): Payer: Medicare Other

## 2016-03-26 ENCOUNTER — Other Ambulatory Visit (HOSPITAL_BASED_OUTPATIENT_CLINIC_OR_DEPARTMENT_OTHER): Payer: Medicare Other

## 2016-03-26 VITALS — BP 118/76 | HR 99 | Temp 98.1°F | Resp 16

## 2016-03-26 DIAGNOSIS — C778 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: Secondary | ICD-10-CM | POA: Diagnosis not present

## 2016-03-26 DIAGNOSIS — C3411 Malignant neoplasm of upper lobe, right bronchus or lung: Secondary | ICD-10-CM

## 2016-03-26 DIAGNOSIS — Z510 Encounter for antineoplastic radiation therapy: Secondary | ICD-10-CM | POA: Diagnosis not present

## 2016-03-26 DIAGNOSIS — C3491 Malignant neoplasm of unspecified part of right bronchus or lung: Secondary | ICD-10-CM

## 2016-03-26 DIAGNOSIS — Z5111 Encounter for antineoplastic chemotherapy: Secondary | ICD-10-CM

## 2016-03-26 LAB — COMPREHENSIVE METABOLIC PANEL
ALT: 19 U/L (ref 0–55)
AST: 16 U/L (ref 5–34)
Albumin: 3.9 g/dL (ref 3.5–5.0)
Alkaline Phosphatase: 71 U/L (ref 40–150)
Anion Gap: 7 mEq/L (ref 3–11)
BUN: 27.6 mg/dL — ABNORMAL HIGH (ref 7.0–26.0)
CO2: 24 mEq/L (ref 22–29)
Calcium: 9.6 mg/dL (ref 8.4–10.4)
Chloride: 107 mEq/L (ref 98–109)
Creatinine: 1.1 mg/dL (ref 0.6–1.1)
EGFR: 48 mL/min/{1.73_m2} — ABNORMAL LOW (ref >90)
Glucose: 102 mg/dl (ref 70–140)
Potassium: 5 mEq/L (ref 3.5–5.1)
Sodium: 138 mEq/L (ref 136–145)
Total Bilirubin: 0.31 mg/dL (ref 0.20–1.20)
Total Protein: 7 g/dL (ref 6.4–8.3)

## 2016-03-26 LAB — CBC WITH DIFFERENTIAL/PLATELET
BASO%: 0.4 % (ref 0.0–2.0)
Basophils Absolute: 0 10*3/uL (ref 0.0–0.1)
EOS%: 1.2 % (ref 0.0–7.0)
Eosinophils Absolute: 0 10*3/uL (ref 0.0–0.5)
HCT: 34 % — ABNORMAL LOW (ref 34.8–46.6)
HGB: 11.3 g/dL — ABNORMAL LOW (ref 11.6–15.9)
LYMPH%: 36.2 % (ref 14.0–49.7)
MCH: 31 pg (ref 25.1–34.0)
MCHC: 33.2 g/dL (ref 31.5–36.0)
MCV: 93.4 fL (ref 79.5–101.0)
MONO#: 0.1 10*3/uL (ref 0.1–0.9)
MONO%: 5.7 % (ref 0.0–14.0)
NEUT#: 1.4 10*3/uL — ABNORMAL LOW (ref 1.5–6.5)
NEUT%: 56.5 % (ref 38.4–76.8)
Platelets: 99 10*3/uL — ABNORMAL LOW (ref 145–400)
RBC: 3.64 10*6/uL — ABNORMAL LOW (ref 3.70–5.45)
RDW: 15.2 % — ABNORMAL HIGH (ref 11.2–14.5)
WBC: 2.5 10*3/uL — ABNORMAL LOW (ref 3.9–10.3)
lymph#: 0.9 10*3/uL (ref 0.9–3.3)

## 2016-03-26 MED ORDER — SODIUM CHLORIDE 0.9 % IV SOLN
20.0000 mg | Freq: Once | INTRAVENOUS | Status: AC
  Administered 2016-03-26: 20 mg via INTRAVENOUS
  Filled 2016-03-26: qty 2

## 2016-03-26 MED ORDER — FAMOTIDINE IN NACL 20-0.9 MG/50ML-% IV SOLN
INTRAVENOUS | Status: AC
  Filled 2016-03-26: qty 50

## 2016-03-26 MED ORDER — DIPHENHYDRAMINE HCL 50 MG/ML IJ SOLN
INTRAMUSCULAR | Status: AC
  Filled 2016-03-26: qty 1

## 2016-03-26 MED ORDER — SODIUM CHLORIDE 0.9 % IV SOLN
157.0000 mg | Freq: Once | INTRAVENOUS | Status: AC
  Administered 2016-03-26: 160 mg via INTRAVENOUS
  Filled 2016-03-26: qty 16

## 2016-03-26 MED ORDER — SODIUM CHLORIDE 0.9 % IV SOLN
Freq: Once | INTRAVENOUS | Status: AC
  Administered 2016-03-26: 12:00:00 via INTRAVENOUS

## 2016-03-26 MED ORDER — FAMOTIDINE IN NACL 20-0.9 MG/50ML-% IV SOLN
20.0000 mg | Freq: Once | INTRAVENOUS | Status: AC
  Administered 2016-03-26: 20 mg via INTRAVENOUS

## 2016-03-26 MED ORDER — DIPHENHYDRAMINE HCL 50 MG/ML IJ SOLN
50.0000 mg | Freq: Once | INTRAMUSCULAR | Status: AC
  Administered 2016-03-26: 50 mg via INTRAVENOUS

## 2016-03-26 MED ORDER — PALONOSETRON HCL INJECTION 0.25 MG/5ML
INTRAVENOUS | Status: AC
  Filled 2016-03-26: qty 5

## 2016-03-26 MED ORDER — PALONOSETRON HCL INJECTION 0.25 MG/5ML
0.2500 mg | Freq: Once | INTRAVENOUS | Status: AC
  Administered 2016-03-26: 0.25 mg via INTRAVENOUS

## 2016-03-26 MED ORDER — SODIUM CHLORIDE 0.9 % IV SOLN
45.0000 mg/m2 | Freq: Once | INTRAVENOUS | Status: AC
  Administered 2016-03-26: 84 mg via INTRAVENOUS
  Filled 2016-03-26: qty 14

## 2016-03-26 NOTE — Patient Instructions (Signed)
[facility] Discharge Instructions for Patients Receiving Chemotherapy  Today you received the following chemotherapy agents Taxol and Carboplatin.  To help prevent nausea and vomiting after your treatment, we encourage you to take your nausea medication as prescribed.   If you develop nausea and vomiting that is not controlled by your nausea medication, call the clinic.   BELOW ARE SYMPTOMS THAT SHOULD BE REPORTED IMMEDIATELY:  *FEVER GREATER THAN 100.5 F  *CHILLS WITH OR WITHOUT FEVER  NAUSEA AND VOMITING THAT IS NOT CONTROLLED WITH YOUR NAUSEA MEDICATION  *UNUSUAL SHORTNESS OF BREATH  *UNUSUAL BRUISING OR BLEEDING  TENDERNESS IN MOUTH AND THROAT WITH OR WITHOUT PRESENCE OF ULCERS  *URINARY PROBLEMS  *BOWEL PROBLEMS  UNUSUAL RASH Items with * indicate a potential emergency and should be followed up as soon as possible.  Feel free to call the clinic you have any questions or concerns. The clinic phone number is [phone].  Please show the CHEMO ALERT CARD at check-in to the Emergency Department and triage nurse.   

## 2016-03-26 NOTE — Progress Notes (Signed)
Dr. Julien Nordmann aware of [place] and Plts. Ok to treat per same.

## 2016-03-27 ENCOUNTER — Ambulatory Visit
Admission: RE | Admit: 2016-03-27 | Discharge: 2016-03-27 | Disposition: A | Discharge status: Home / Self Care | Payer: Medicare Other | Source: Ambulatory Visit | Attending: Radiation Oncology | Admitting: Radiation Oncology

## 2016-03-27 ENCOUNTER — Encounter: Payer: Self-pay | Admitting: Radiation Oncology

## 2016-03-27 VITALS — BP 139/91 | HR 105 | Temp 99.1°F | Ht 64.0 in | Wt 175.2 lb

## 2016-03-27 DIAGNOSIS — C3491 Malignant neoplasm of unspecified part of right bronchus or lung: Secondary | ICD-10-CM

## 2016-03-27 DIAGNOSIS — Z510 Encounter for antineoplastic radiation therapy: Secondary | ICD-10-CM | POA: Diagnosis not present

## 2016-03-27 NOTE — Progress Notes (Signed)
  Radiation Oncology         [phone] ________________________________  Name: Tammy Ball MRN: [id number]  Date: 03/27/2016  DOB: [birth year]  Weekly Radiation Therapy Management    ICD-9-CM ICD-10-CM   1. Non-small cell carcinoma of right lung, stage 2 (HCC) 162.9 C34.91      Current Dose: 34.2 Gy     Planned Dose:  50.4 Gy  Narrative . . . . . . . . The patiHarriet Essman has completed 19 fractions to her right central chest. She denies having pain today except for with swallowing. She is taking carafate and says it helps. She continues to reports having shortness of breath with activity. She reports having a cough with frothy, yellow sputum. The skin on her central chest and back is red. She reports her skin is itchy. She is using radiaplex. She reports feeling fatigued. She had chemotherapy yesterday. She reports she has not been using advair or dulera due to the cost. Her insurance will not cover them anymore                                                                   Set-up films were reviewed.                                 The chart was checked. Physical Findings. . .  height is '5\' 4"'$  (1.626 m) and weight is 175 lb 3.2 oz (79.47 kg). Her oral temperature is 99.1 F (37.3 C). Her blood pressure is 139/91 and her pulse is 105. Her oxygen saturation is 97%. .Lungs are clear to auscultation bilaterally. Heart has regular rate and rhythm. No palpable cervical, supraclavicular, or axillary adenopathy. Abdomen soft, non-tender, normal bowel sounds. Impression . . . . . . . The patient is tolerating radiation. Plan . . . . . . . . . . . . Continue treatment as planned.  ________________________________   Blair Promise, PhD, MD

## 2016-03-27 NOTE — Progress Notes (Signed)
Tammy Ball has completed 19 fractions to her right central chest.  She denies having pain today except for with swallowing.  She is taking carafate and says it helps.  She continues to reports having shortness of breath with activity.  She reports having a cough with frothy, yellow sputum.  The skin on her central chest and back is red.  She reports her skin is itchy.  She is using radiaplex.  She reports feeling fatigued.  She had chemotherapy yesterday.  She reports she has not been using advair or dulera due to the cost.  Her insurance will not cover them anymore.  BP 139/91 mmHg  Pulse 105  Temp(Src) 99.1 F (37.3 C) (Oral)  Ht '5\' 4"'$  (1.626 m)  Wt 175 lb 3.2 oz (79.47 kg)  BMI 30.06 kg/m2  SpO2 97%   Wt Readings from Last 3 Encounters:  03/27/16 175 lb 3.2 oz (79.47 kg)  03/19/16 174 lb (78.926 kg)  03/13/16 177 lb 12.8 oz (80.65 kg)

## 2016-03-28 ENCOUNTER — Ambulatory Visit
Admission: RE | Admit: 2016-03-28 | Discharge: 2016-03-28 | Disposition: A | Discharge status: Home / Self Care | Payer: Medicare Other | Source: Ambulatory Visit | Attending: Radiation Oncology | Admitting: Radiation Oncology

## 2016-03-28 DIAGNOSIS — Z510 Encounter for antineoplastic radiation therapy: Secondary | ICD-10-CM | POA: Diagnosis not present

## 2016-03-28 DIAGNOSIS — C3491 Malignant neoplasm of unspecified part of right bronchus or lung: Secondary | ICD-10-CM | POA: Diagnosis not present

## 2016-03-29 ENCOUNTER — Ambulatory Visit
Admission: RE | Admit: 2016-03-29 | Discharge: 2016-03-29 | Disposition: A | Discharge status: Home / Self Care | Payer: Medicare Other | Source: Ambulatory Visit | Attending: Radiation Oncology | Admitting: Radiation Oncology

## 2016-03-29 DIAGNOSIS — Z510 Encounter for antineoplastic radiation therapy: Secondary | ICD-10-CM | POA: Diagnosis not present

## 2016-03-29 DIAGNOSIS — C3491 Malignant neoplasm of unspecified part of right bronchus or lung: Secondary | ICD-10-CM | POA: Diagnosis not present

## 2016-03-30 ENCOUNTER — Ambulatory Visit
Admission: RE | Admit: 2016-03-30 | Discharge: 2016-03-30 | Disposition: A | Discharge status: Home / Self Care | Payer: Medicare Other | Source: Ambulatory Visit | Attending: Radiation Oncology | Admitting: Radiation Oncology

## 2016-03-30 DIAGNOSIS — Z510 Encounter for antineoplastic radiation therapy: Secondary | ICD-10-CM | POA: Diagnosis not present

## 2016-03-30 DIAGNOSIS — C3491 Malignant neoplasm of unspecified part of right bronchus or lung: Secondary | ICD-10-CM | POA: Diagnosis not present

## 2016-04-02 ENCOUNTER — Ambulatory Visit
Admission: RE | Admit: 2016-04-02 | Discharge: 2016-04-02 | Disposition: A | Discharge status: Home / Self Care | Payer: Medicare Other | Source: Ambulatory Visit | Attending: Radiation Oncology | Admitting: Radiation Oncology

## 2016-04-02 ENCOUNTER — Ambulatory Visit: Payer: Medicare Other

## 2016-04-02 ENCOUNTER — Encounter: Payer: Self-pay | Admitting: Internal Medicine

## 2016-04-02 ENCOUNTER — Telehealth: Payer: Self-pay | Admitting: Internal Medicine

## 2016-04-02 ENCOUNTER — Other Ambulatory Visit (HOSPITAL_BASED_OUTPATIENT_CLINIC_OR_DEPARTMENT_OTHER): Payer: Medicare Other

## 2016-04-02 ENCOUNTER — Ambulatory Visit (HOSPITAL_BASED_OUTPATIENT_CLINIC_OR_DEPARTMENT_OTHER): Payer: Medicare Other | Admitting: Internal Medicine

## 2016-04-02 VITALS — BP 128/68 | HR 103 | Temp 98.6°F | Resp 18 | Ht 64.0 in | Wt 174.6 lb

## 2016-04-02 DIAGNOSIS — Z5111 Encounter for antineoplastic chemotherapy: Secondary | ICD-10-CM

## 2016-04-02 DIAGNOSIS — Z510 Encounter for antineoplastic radiation therapy: Secondary | ICD-10-CM | POA: Diagnosis not present

## 2016-04-02 DIAGNOSIS — C771 Secondary and unspecified malignant neoplasm of intrathoracic lymph nodes: Secondary | ICD-10-CM | POA: Diagnosis not present

## 2016-04-02 DIAGNOSIS — D72819 Decreased white blood cell count, unspecified: Secondary | ICD-10-CM

## 2016-04-02 DIAGNOSIS — C3411 Malignant neoplasm of upper lobe, right bronchus or lung: Secondary | ICD-10-CM | POA: Diagnosis not present

## 2016-04-02 DIAGNOSIS — C3491 Malignant neoplasm of unspecified part of right bronchus or lung: Secondary | ICD-10-CM

## 2016-04-02 LAB — CBC WITH DIFFERENTIAL/PLATELET
BASO%: 0 % (ref 0.0–2.0)
Basophils Absolute: 0 10*3/uL (ref 0.0–0.1)
EOS%: 0.6 % (ref 0.0–7.0)
Eosinophils Absolute: 0 10*3/uL (ref 0.0–0.5)
HCT: 34.4 % — ABNORMAL LOW (ref 34.8–46.6)
HGB: 11.3 g/dL — ABNORMAL LOW (ref 11.6–15.9)
LYMPH%: 40.6 % (ref 14.0–49.7)
MCH: 30.7 pg (ref 25.1–34.0)
MCHC: 32.8 g/dL (ref 31.5–36.0)
MCV: 93.5 fL (ref 79.5–101.0)
MONO#: 0.1 10*3/uL (ref 0.1–0.9)
MONO%: 7.8 % (ref 0.0–14.0)
NEUT#: 0.9 10*3/uL — ABNORMAL LOW (ref 1.5–6.5)
NEUT%: 51 % (ref 38.4–76.8)
Platelets: 120 10*3/uL — ABNORMAL LOW (ref 145–400)
RBC: 3.68 10*6/uL — ABNORMAL LOW (ref 3.70–5.45)
RDW: 15.7 % — ABNORMAL HIGH (ref 11.2–14.5)
WBC: 1.8 10*3/uL — ABNORMAL LOW (ref 3.9–10.3)
lymph#: 0.7 10*3/uL — ABNORMAL LOW (ref 0.9–3.3)

## 2016-04-02 LAB — COMPREHENSIVE METABOLIC PANEL
ALT: 17 U/L (ref 0–55)
AST: 15 U/L (ref 5–34)
Albumin: 3.9 g/dL (ref 3.5–5.0)
Alkaline Phosphatase: 79 U/L (ref 40–150)
Anion Gap: 8 mEq/L (ref 3–11)
BUN: 29 mg/dL — ABNORMAL HIGH (ref 7.0–26.0)
CO2: 25 mEq/L (ref 22–29)
Calcium: 9.7 mg/dL (ref 8.4–10.4)
Chloride: 106 mEq/L (ref 98–109)
Creatinine: 1.1 mg/dL (ref 0.6–1.1)
EGFR: 49 mL/min/{1.73_m2} — ABNORMAL LOW (ref >90)
Glucose: 91 mg/dl (ref 70–140)
Potassium: 4.9 mEq/L (ref 3.5–5.1)
Sodium: 139 mEq/L (ref 136–145)
Total Bilirubin: <0.3 mg/dL (ref 0.20–1.20)
Total Protein: 7.1 g/dL (ref 6.4–8.3)

## 2016-04-02 NOTE — Telephone Encounter (Signed)
Gave and printed appt sched and avs for pt for July  °

## 2016-04-02 NOTE — Progress Notes (Signed)
Tammy Ball [phone]   Fax:(336) Allen, MD [address] Tammy Ball [address]  DIAGNOSIS: Stage IIIA (T2a, N2, M0) non-small cell lung cancer, squamous cell carcinoma presented with large right upper lobe lung mass with right hilar involvement diagnosed in November 2016.  PRIOR THERAPY:  1) Neoadjuvant systemic chemotherapy with carboplatin for AUC of 6 and paclitaxel 200 MG/M2 every 3 weeks, status post 3 cycles with partial response. 2) Video bronchoscopy, right video-assisted thoracoscopy, minithoracotomy, right upper lobectomy with lymph node dissection on 01/18/2016. The final pathology revealed residual squamous cell carcinoma measuring 3.1 cm with close resection margin and metastatic carcinoma to lymph nodes at levels 10R, 12R and 4R. (ypT2a, yN2).   CURRENT THERAPY: Concurrent chemoradiation with weekly carboplatin for AUC of 2 and paclitaxel 45 MG/M2. First dose 02/27/2016. Status post 5 cycles  INTERVAL HISTORY: Tammy Ball 75 y.o. female returns to the clinic today for follow-up visit. She is currently undergoing concurrent chemoradiation with weekly carboplatin and paclitaxel is status post 5 cycles. She tolerated the 5 week of her treatment fairly well with no significant adverse effects. She continues to have mild fatigue. She denied having any significant weight loss or night sweats. She denied having any nausea or vomiting, no fever or chills. She has no significant shortness of breath, cough or hemoptysis. She had mild odynophagia and the patient was started by Dr. Sondra Come on Carafate. She is here today for evaluation before starting cycle #6.  MEDICAL HISTORY: Past Medical History  Diagnosis Date  . COPD (chronic obstructive pulmonary disease) ([place])   . Hypertension   . Depression   . Asthma   . Glaucoma   . Hyperlipidemia   . Heart murmur   . Non-small cell carcinoma of right lung,  stage 2 (Tammy Ball) 10/03/2015  . Chemotherapy-induced neuropathy ([place]) 11/01/2015  . History of nuclear stress test     Myoview 2/17: no ischemia or scar, EF 79%; low risk  . History of echocardiogram     Echo 2/17: EF 19-14%, grade 1 diastolic dysfunction, mild MR, trivial pericardial effusion  . Renal cell carcinoma (Tammy Ball)     L nephrectomy  in 2012  . Anxiety   . Claustrophobia   . Headache     prior to menopause  . Anemia     was while doing chemo  . Encounter for antineoplastic chemotherapy 02/09/2016    ALLERGIES:  is allergic to bee venom; amlodipine; levofloxacin; and hctz.  MEDICATIONS:  Current Outpatient Prescriptions  Medication Sig Dispense Refill  . acetaminophen (TYLENOL) 500 MG tablet Take 1,000 mg by mouth every 6 (six) hours as needed for mild pain, moderate pain, fever or headache. Reported on 03/16/2016    . albuterol (PROVENTIL HFA;VENTOLIN HFA) 108 (90 BASE) MCG/ACT inhaler Inhale 2 puffs into the lungs every 4 (four) hours as needed for wheezing or shortness of breath. 1 Inhaler 0  . aspirin EC 81 MG tablet Take 81 mg by mouth daily.    Tammy Ball atorvastatin (LIPITOR) 40 MG tablet TAKE ONE TABLET BY MOUTH ONCE DAILY. (Patient taking differently: Take 40 mg by mouth daily. ) 90 tablet 3  . azithromycin (ZITHROMAX Z-PAK) 250 MG tablet Take by mouth daily. Reported on 03/27/2016    . benzonatate (TESSALON) 200 MG capsule Take 1 capsule (200 mg total) by mouth 3 (three) times daily as needed for cough. 20 capsule 0  . calcium carbonate (OSCAL) 1500 (600 Ca)  MG TABS tablet Take by mouth 2 (two) times daily with a meal.    . fish oil-omega-3 fatty acids 1000 MG capsule Take 1 capsule by mouth daily.     . fluticasone (FLONASE) 50 MCG/ACT nasal spray Place 2 sprays into both nostrils daily. 9.9 g 0  . hyaluronate sodium (RADIAPLEXRX) GEL Apply 1 application topically 2 (two) times daily.    Tammy Ball LORazepam (ATIVAN) 0.5 MG tablet Take 1 tablet (0.5 mg total) by mouth 2 (two) times daily as  needed for anxiety. 10 tablet 1  . methylPREDNISolone (MEDROL DOSEPAK) 4 MG TBPK tablet Medrol dose pak: take as directed. 21 tablet 0  . mometasone-formoterol (DULERA) 200-5 MCG/ACT AERO Inhale 2 puffs into the lungs 2 (two) times daily. 3 Inhaler 0  . oxyCODONE-acetaminophen (PERCOCET/ROXICET) 5-325 MG tablet Take 1-2 tablets by mouth every 6 (six) hours as needed for severe pain. 30 tablet 0  . polyvinyl alcohol-povidone (REFRESH) 1.4-0.6 % ophthalmic solution Place 2 drops into both eyes daily as needed (for dry eyes).    . pseudoephedrine-guaifenesin (MUCINEX D) 60-600 MG 12 hr tablet Take 1 tablet by mouth every 12 (twelve) hours. Reported on 03/27/2016    . sucralfate (CARAFATE) 1 g tablet Take 1 tablet (1 g total) by mouth 4 (four) times daily -  with meals and at bedtime. Mix in a few teaspoons of water as a slurry 120 tablet 2  . traMADol (ULTRAM) 50 MG tablet Reported on 03/16/2016    . UNABLE TO FIND Med Name: Wig 1 each 0  . valsartan (DIOVAN) 80 MG tablet Take 1 tablet (80 mg total) by mouth daily. 90 tablet 0  . Fluticasone-Salmeterol (ADVAIR) 100-50 MCG/DOSE AEPB Inhale 1 puff into the lungs 2 (two) times daily. (Patient not taking: Reported on 03/27/2016) 1 each 11   No current facility-administered medications for this visit.    SURGICAL HISTORY:  Past Surgical History  Procedure Laterality Date  . Nephrectomy    . Kidney cancer    . Tubal ligation    . Spine surgery    . Eye surgery    . Video bronchoscopy Bilateral 09/20/2015    Procedure: VIDEO BRONCHOSCOPY WITHOUT FLUORO;  Surgeon: Rigoberto Noel, MD;  Location: [facility];  Service: Cardiopulmonary;  Laterality: Bilateral;  . Back surgery      cervical 1991  . Video bronchoscopy N/A 01/18/2016    Procedure: VIDEO BRONCHOSCOPY;  Surgeon: [name], MD;  Location: [facility];  Service: Thoracic;  Laterality: N/A;  . Video assisted thoracoscopy (vats)/wedge resection Right 01/18/2016    Procedure: VIDEO ASSISTED  THORACOSCOPY (VATS)/LUNG RESECTION, THOROCOTOMY, RIGHT UPPER LOBECTOMY, LYMPH NODE DISSECTION, PLACEMENT OF ON Q;  Surgeon: [name], MD;  Location: [facility];  Service: Thoracic;  Laterality: Right;    REVIEW OF SYSTEMS:  A comprehensive review of systems was negative except for: Constitutional: positive for fatigue Neurological: positive for paresthesia   PHYSICAL EXAMINATION: General appearance: alert, cooperative, fatigued and no distress Head: Normocephalic, without obvious abnormality, atraumatic Neck: no adenopathy, no JVD, supple, symmetrical, trachea midline and thyroid not enlarged, symmetric, no tenderness/mass/nodules Lymph nodes: Cervical, supraclavicular, and axillary nodes normal. Resp: clear to auscultation bilaterally Back: symmetric, no curvature. ROM normal. No CVA tenderness. Cardio: regular rate and rhythm, S1, S2 normal, no murmur, click, rub or gallop GI: soft, non-tender; bowel sounds normal; no masses,  no organomegaly Extremities: extremities normal, atraumatic, no cyanosis or edema Neurologic: Alert and oriented X 3, normal strength and tone. Normal  symmetric reflexes. Normal coordination and gait  ECOG PERFORMANCE STATUS: 1 - Symptomatic but completely ambulatory  Blood pressure 128/68, pulse 103, temperature 98.6 F (37 C), temperature source Oral, resp. rate 18, height '5\' 4"'$  (1.626 m), weight 174 lb 9.6 oz (79.198 kg), SpO2 98 %.  LABORATORY DATA: Lab Results  Component Value Date   WBC 1.8* 04/02/2016   HGB 11.3* 04/02/2016   HCT 34.4* 04/02/2016   MCV 93.5 04/02/2016   PLT 120* 04/02/2016      Chemistry      Component Value Date/Time   NA 139 04/02/2016 1145   NA 137 01/23/2016 0301   K 4.9 04/02/2016 1145   K 4.5 01/23/2016 0301   CL 98* 01/23/2016 0301   CO2 25 04/02/2016 1145   CO2 33* 01/23/2016 0301   BUN 29.0* 04/02/2016 1145   BUN 20 01/23/2016 0301   BUN 24* 12/11/2011   CREATININE 1.1 04/02/2016 1145   CREATININE 1.03*  01/23/2016 0301   CREATININE 1.08 12/02/2013 0911   GLU 105 12/10/2009      Component Value Date/Time   CALCIUM 9.7 04/02/2016 1145   CALCIUM 9.1 01/23/2016 0301   ALKPHOS 79 04/02/2016 1145   ALKPHOS 61 01/20/2016 0425   AST 15 04/02/2016 1145   AST 22 01/20/2016 0425   ALT 17 04/02/2016 1145   ALT 15 01/20/2016 0425   BILITOT <0.30 04/02/2016 1145   BILITOT 0.3 01/20/2016 0425       RADIOGRAPHIC STUDIES: Dg Chest 2 View  03/07/2016  CLINICAL DATA:  75 year old female with productive cough and wheezing since last night. Currently undergoing chemo radiation for right lung non-small cell carcinoma stage II. Subsequent encounter. EXAM: CHEST  2 VIEW COMPARISON:  02/29/2016 and earlier. FINDINGS: Lung volumes are stable and within normal limits. No pneumothorax, pulmonary edema or pleural effusion. No acute pulmonary opacity. Stable cardiac size and mediastinal contours. Osteopenia. No acute osseous abnormality identified. Lower cervical ACDF hardware again noted. Abdominal aortic calcification again noted. IMPRESSION: Stable.  No acute cardiopulmonary abnormality. Calcified aortic atherosclerosis. Electronically Signed   By: Genevie Ann M.D.   On: 03/07/2016 12:02    ASSESSMENT AND PLAN: This is a very pleasant 75 years old white female recently diagnosed with stage II a non-small cell lung cancer, squamous cell carcinoma and completed neoadjuvant systemic chemotherapy with carboplatin and paclitaxel status post 3 cycles.  The recent CT scan of the chest showed partial response response with significant decrease in the size of the central right upper lobe pulmonary nodule and increased cavitation.  The patient underwent right upper lobectomy with lymph node dissection that showed residual tumor in addition to metastatic disease to the mediastinal lymph nodes and close resection margin. The patient was started on a course of concurrent chemoradiation with weekly carboplatin and paclitaxel status  post 5 cycles. She tolerated the first 5 cycles well.  Her total white blood count and absolute treatment to feel count is low today. I recommended for the patient to discontinue her systemic chemotherapy at this point she has few more fractions of radiation to complete this course of concurrent chemoradiation. I recommended for the patient to come back for follow-up visit in 6 weeks for reevaluation with repeat CT scan of the chest for restaging of her disease. She was advised to call immediately if she has any concerning symptoms in the interval. The patient voices understanding of current disease status and treatment options and is in agreement with the current care plan.  All  questions were answered. The patient knows to call the clinic with any problems, questions or concerns. We can certainly see the patient much sooner if necessary.  Disclaimer: This note was dictated with voice recognition software. Similar sounding words can inadvertently be transcribed and may not be corrected upon review.

## 2016-04-03 ENCOUNTER — Encounter: Payer: Self-pay | Admitting: Radiation Oncology

## 2016-04-03 ENCOUNTER — Ambulatory Visit
Admission: RE | Admit: 2016-04-03 | Discharge: 2016-04-03 | Disposition: A | Discharge status: Home / Self Care | Payer: Medicare Other | Source: Ambulatory Visit | Attending: Radiation Oncology | Admitting: Radiation Oncology

## 2016-04-03 VITALS — BP 144/77 | HR 104 | Temp 99.3°F | Resp 16 | Ht 64.0 in | Wt 174.9 lb

## 2016-04-03 DIAGNOSIS — C3491 Malignant neoplasm of unspecified part of right bronchus or lung: Secondary | ICD-10-CM | POA: Diagnosis not present

## 2016-04-03 DIAGNOSIS — Z510 Encounter for antineoplastic radiation therapy: Secondary | ICD-10-CM | POA: Diagnosis not present

## 2016-04-03 NOTE — Progress Notes (Signed)
  Radiation Oncology         [phone] ________________________________  Name: Tammy Ball MRN: [id number]  Date: 04/03/2016  DOB: [birth year]  Weekly Radiation Therapy Management    ICD-9-CM ICD-10-CM   1. Non-small cell carcinoma of right lung, stage 2 ([facility]) 162.9 C34.91      Current Dose: 43.2 Gy     Planned Dose:  50.4 Gy  Narrative . . . . . . . . The patient presents for routine under treatment assessment.                                   Tammy Ball has completed 24 fractions to her right central chest. She denies having pain and shortness of breath. She reports an occasional productive cough with frothy sputum. She reports having fatigue. She did not have chemotherapy yesterday due to her blood counts. She reports having a sore throat/trouble swallowing and is taking Carafate, Pepcid and Tums.  She is using radiaplex and hydrocortisone cream.                                  Set-up films were reviewed.                                 The chart was checked. Physical Findings. . .  height is '5\' 4"'$  (1.626 m) and weight is 174 lb 14.4 oz (79.334 kg). Her oral temperature is 99.3 F (37.4 C). Her blood pressure is 144/77 and her pulse is 104. Her respiration is 16 and oxygen saturation is 97%. . Weight essentially stable.  No significant changes.  The lungs are clear. The heart has a regular rhythm with a slightly increased rate.  The skin on her central chest and back is red with a rash like appearance. Impression . . . . . . . The patient is tolerating radiation. Plan . . . . . . . . . . . . Continue treatment as planned.  ________________________________   Blair Promise, PhD, MD

## 2016-04-03 NOTE — Progress Notes (Signed)
Tammy Ball has completed 24 fractions to her right central chest.  She denies having pain and shortness of breath.  She reports an occasional productive cough with frothy sputum.  She reports having fatigue.  She did not have chemotherapy yesterday due to her blood counts.  She reports having a sore throat/trouble swallowing and is taking Carafate, Pepcid and Tums.  The skin on her central chest and back is red with a rash like appearance.  She is using radiaplex and hydrocortisone cream.  She is also questioning why her heart rate has been high and if there are any clinical trials she can participate in.  BP 144/77 mmHg  Pulse 104  Temp(Src) 99.3 F (37.4 C) (Oral)  Resp 16  Ht '5\' 4"'$  (1.626 m)  Wt 174 lb 14.4 oz (79.334 kg)  BMI 30.01 kg/m2  SpO2 97%   Wt Readings from Last 3 Encounters:  04/03/16 174 lb 14.4 oz (79.334 kg)  04/02/16 174 lb 9.6 oz (79.198 kg)  03/27/16 175 lb 3.2 oz (79.47 kg)

## 2016-04-04 ENCOUNTER — Ambulatory Visit
Admission: RE | Admit: 2016-04-04 | Discharge: 2016-04-04 | Disposition: A | Discharge status: Home / Self Care | Payer: Medicare Other | Source: Ambulatory Visit | Attending: Radiation Oncology | Admitting: Radiation Oncology

## 2016-04-04 DIAGNOSIS — C3491 Malignant neoplasm of unspecified part of right bronchus or lung: Secondary | ICD-10-CM | POA: Diagnosis not present

## 2016-04-04 DIAGNOSIS — Z510 Encounter for antineoplastic radiation therapy: Secondary | ICD-10-CM | POA: Diagnosis not present

## 2016-04-05 ENCOUNTER — Ambulatory Visit
Admission: RE | Admit: 2016-04-05 | Discharge: 2016-04-05 | Disposition: A | Discharge status: Home / Self Care | Payer: Medicare Other | Source: Ambulatory Visit | Attending: Radiation Oncology | Admitting: Radiation Oncology

## 2016-04-05 DIAGNOSIS — C3491 Malignant neoplasm of unspecified part of right bronchus or lung: Secondary | ICD-10-CM | POA: Diagnosis not present

## 2016-04-05 DIAGNOSIS — Z510 Encounter for antineoplastic radiation therapy: Secondary | ICD-10-CM | POA: Diagnosis not present

## 2016-04-06 ENCOUNTER — Ambulatory Visit
Admission: RE | Admit: 2016-04-06 | Discharge: 2016-04-06 | Disposition: A | Discharge status: Home / Self Care | Payer: Medicare Other | Source: Ambulatory Visit | Attending: Radiation Oncology | Admitting: Radiation Oncology

## 2016-04-06 ENCOUNTER — Ambulatory Visit: Payer: Medicare Other

## 2016-04-06 DIAGNOSIS — C3491 Malignant neoplasm of unspecified part of right bronchus or lung: Secondary | ICD-10-CM | POA: Diagnosis not present

## 2016-04-06 DIAGNOSIS — Z510 Encounter for antineoplastic radiation therapy: Secondary | ICD-10-CM | POA: Diagnosis not present

## 2016-04-10 ENCOUNTER — Telehealth: Payer: Self-pay | Admitting: Internal Medicine

## 2016-04-10 ENCOUNTER — Encounter: Payer: Self-pay | Admitting: Radiation Oncology

## 2016-04-10 ENCOUNTER — Ambulatory Visit
Admission: RE | Admit: 2016-04-10 | Discharge: 2016-04-10 | Disposition: A | Discharge status: Home / Self Care | Payer: Medicare Other | Source: Ambulatory Visit | Attending: Radiation Oncology | Admitting: Radiation Oncology

## 2016-04-10 ENCOUNTER — Other Ambulatory Visit: Payer: Self-pay | Admitting: Radiation Oncology

## 2016-04-10 ENCOUNTER — Telehealth: Payer: Self-pay | Admitting: Oncology

## 2016-04-10 ENCOUNTER — Ambulatory Visit: Payer: Medicare Other

## 2016-04-10 ENCOUNTER — Telehealth: Payer: Self-pay | Admitting: *Deleted

## 2016-04-10 VITALS — BP 140/80 | HR 117 | Temp 98.4°F | Ht 64.0 in | Wt 174.7 lb

## 2016-04-10 DIAGNOSIS — R509 Fever, unspecified: Secondary | ICD-10-CM

## 2016-04-10 DIAGNOSIS — R059 Cough, unspecified: Secondary | ICD-10-CM

## 2016-04-10 DIAGNOSIS — Z510 Encounter for antineoplastic radiation therapy: Secondary | ICD-10-CM | POA: Diagnosis not present

## 2016-04-10 DIAGNOSIS — C3491 Malignant neoplasm of unspecified part of right bronchus or lung: Secondary | ICD-10-CM

## 2016-04-10 DIAGNOSIS — R05 Cough: Secondary | ICD-10-CM

## 2016-04-10 NOTE — Telephone Encounter (Addendum)
Tammy Ball with Symptom Management to see if patient can see Selena Lesser, NP today for low grade fevers, night sweats and yellow productive cough.  Per [place] Pho is off today but will be back tomorrow.  She advised that the patient contact her primary care doctor today or schedule an appointment for tomorrow.  Patient said she is not sure if her primary care doctor is retired or not.  She would like to make an appointment with Cyndee tomorrow.  Called Clarise Cruz back and a chest xray, labs and appointment with Cyndee were scheduled starting at 8:30 tomorrow morning.  Called and left a message for [place].

## 2016-04-10 NOTE — Telephone Encounter (Signed)
Apt added pt aware

## 2016-04-10 NOTE — Telephone Encounter (Addendum)
Tammy Ball called back and was advised of the 8:30 appointment for a chest x ray and then labs/appointment with Tammy Lesser, NP.  [name] verbalized agreement and understanding.

## 2016-04-10 NOTE — Progress Notes (Signed)
Tammy Ball has completed 28 fractions to her right central chest.  She denies pain today but has occasional mid back pain that started in the last few weeks.  She reports having a more frequent productive cough with yellow sputum.  She reports having the yellow sputum for the past 4-5 days.  Before that it was white and frothy.  She reports having wheezing at night.  She reports shortness of breath which she thinks may be a little worse since starting radiation.  She denies having hemoptysis.  She does reports having nose bleeds occasionally since she started chemotherapy.  She reports seeing a clot of blood occasionally.  She reports running low grade temperatures over the weekend (99.1 and 99.2) with body aches.  Her temperature today was 98.4.  She also reports having night sweats since she started chemo.  She had her last chemo treatment 2 weeks ago.  The skin on her right mid chest and upper back is red.  She is using radiaplex.  She has been given a one month follow up appointment.  BP 140/80 mmHg  Pulse 117  Temp(Src) 98.4 F (36.9 C) (Oral)  Ht '5\' 4"'$  (1.626 m)  Wt 174 lb 11.2 oz (79.243 kg)  BMI 29.97 kg/m2  SpO2 97%   Wt Readings from Last 3 Encounters:  04/10/16 174 lb 11.2 oz (79.243 kg)  04/03/16 174 lb 14.4 oz (79.334 kg)  04/02/16 174 lb 9.6 oz (79.198 kg)

## 2016-04-10 NOTE — Progress Notes (Signed)
  Radiation Oncology         [phone] ________________________________  Name: Tammy Ball MRN: [id number]  Date: 04/10/2016  DOB: 06-29-1941  Weekly Radiation Therapy Management  No diagnosis found.  Current Dose: 50.4 Gy     Planned Dose:  50.4 Gy  Narrative . . . . . . . . The patient presents for routine under treatment assessment.                                  The patient had 28 fractions and completed today. She denies pain today, but has occasional mid-back pain that started in the last few weeks. She reports having a more frequent productive cough with yellow sputum. She reports having the yellow sputum for the past 4-5 days. Before that it was white and frothy. She reports having wheezing at night. She reports shortness of breath which she thinks may be a little worse since starting radiation. She denies hemoptysis. She does report having occasional nose bleeds since she started chemotherapy. She reports seeing a clot of blood occasionally. She reports running low grade temperatures over the weekend (99.1 and 99.2) with body aches. Her temperature today was 98.4. She has had some low grade fevers while receiving chemotherapy she states. She also reports having night sweats since she started chemo. She had her last chemo treatment 2 weeks ago. The skin on her right mid chest and upper back is red.She is using radiaplex. She has been given a one month follow up appointment.                                 Set-up films were reviewed.                                 The chart was checked. Physical Findings. . .  height is '5\' 4"'$  (1.626 m) and weight is 174 lb 11.2 oz (79.243 kg). Her oral temperature is 98.4 F (36.9 C). Her blood pressure is 140/80 and her pulse is 117. Her oxygen saturation is 97%. . A physical exam was deferred during this encounter. She appears in no respiratory distress. Impression . . . . . . . The patient is tolerating radiation. Plan . . . . . .  . . . . . . We will refer the patient to Retta Mac tomorrow regarding her low grade fever and body aches. She has completed treatment and she will return for a follow up in a month. ________________________________  ------------------------------------------------  Thea Silversmith, MD  This document serves as a record of services personally performed by Thea Silversmith, MD. It was created on her behalf by Darcus Austin, a trained medical scribe. The creation of this record is based on the scribe's personal observations and the provider's statements to them. This document has been checked and approved by the attending provider.

## 2016-04-10 NOTE — Telephone Encounter (Signed)
Patient went to radiation today and stated she had a low grade temp all weekend. Santiago Glad called to report to [facility] nurse.  Other symptoms she was experiencing cough, greenish yellow sputum and generalized weakness. Pt advised to contact PCP since no [facility] clinic today. Pt refused and states she will wait until tomorrow.   POF sent and orders entered. Pt advised to report to radiology at 830am for a chest xray, labs and [facility] after.   Santiago Glad in radiation will notify pt.

## 2016-04-11 ENCOUNTER — Ambulatory Visit (HOSPITAL_BASED_OUTPATIENT_CLINIC_OR_DEPARTMENT_OTHER): Payer: Medicare Other | Admitting: Nurse Practitioner

## 2016-04-11 ENCOUNTER — Ambulatory Visit (HOSPITAL_COMMUNITY)
Admission: RE | Admit: 2016-04-11 | Discharge: 2016-04-11 | Disposition: A | Discharge status: Home / Self Care | Payer: Medicare Other | Source: Ambulatory Visit | Attending: Nurse Practitioner | Admitting: Nurse Practitioner

## 2016-04-11 ENCOUNTER — Other Ambulatory Visit (HOSPITAL_BASED_OUTPATIENT_CLINIC_OR_DEPARTMENT_OTHER): Payer: Medicare Other

## 2016-04-11 VITALS — BP 132/76 | HR 109 | Temp 98.9°F | Resp 18 | Ht 64.0 in | Wt 174.8 lb

## 2016-04-11 DIAGNOSIS — J700 Acute pulmonary manifestations due to radiation: Secondary | ICD-10-CM | POA: Insufficient documentation

## 2016-04-11 DIAGNOSIS — R509 Fever, unspecified: Secondary | ICD-10-CM

## 2016-04-11 DIAGNOSIS — R05 Cough: Secondary | ICD-10-CM

## 2016-04-11 DIAGNOSIS — J40 Bronchitis, not specified as acute or chronic: Secondary | ICD-10-CM

## 2016-04-11 DIAGNOSIS — R059 Cough, unspecified: Secondary | ICD-10-CM

## 2016-04-11 DIAGNOSIS — C3491 Malignant neoplasm of unspecified part of right bronchus or lung: Secondary | ICD-10-CM

## 2016-04-11 DIAGNOSIS — T451X5A Adverse effect of antineoplastic and immunosuppressive drugs, initial encounter: Secondary | ICD-10-CM

## 2016-04-11 DIAGNOSIS — D701 Agranulocytosis secondary to cancer chemotherapy: Secondary | ICD-10-CM

## 2016-04-11 LAB — CBC WITH DIFFERENTIAL/PLATELET
BASO%: 1.1 % (ref 0.0–2.0)
Basophils Absolute: 0 10*3/uL (ref 0.0–0.1)
EOS%: 0.6 % (ref 0.0–7.0)
Eosinophils Absolute: 0 10*3/uL (ref 0.0–0.5)
HCT: 33.9 % — ABNORMAL LOW (ref 34.8–46.6)
HGB: 10.9 g/dL — ABNORMAL LOW (ref 11.6–15.9)
LYMPH%: 32.8 % (ref 14.0–49.7)
MCH: 30.4 pg (ref 25.1–34.0)
MCHC: 32.1 g/dL (ref 31.5–36.0)
MCV: 94.6 fL (ref 79.5–101.0)
MONO#: 0.7 10*3/uL (ref 0.1–0.9)
MONO%: 35.4 % — ABNORMAL HIGH (ref 0.0–14.0)
NEUT#: 0.6 10*3/uL — ABNORMAL LOW (ref 1.5–6.5)
NEUT%: 30.1 % — ABNORMAL LOW (ref 38.4–76.8)
Platelets: 361 10*3/uL (ref 145–400)
RBC: 3.58 10*6/uL — ABNORMAL LOW (ref 3.70–5.45)
RDW: 17.4 % — ABNORMAL HIGH (ref 11.2–14.5)
WBC: 2 10*3/uL — ABNORMAL LOW (ref 3.9–10.3)
lymph#: 0.7 10*3/uL — ABNORMAL LOW (ref 0.9–3.3)

## 2016-04-11 LAB — COMPREHENSIVE METABOLIC PANEL
ALT: 16 U/L (ref 0–55)
AST: 16 U/L (ref 5–34)
Albumin: 3.7 g/dL (ref 3.5–5.0)
Alkaline Phosphatase: 87 U/L (ref 40–150)
Anion Gap: 9 mEq/L (ref 3–11)
BUN: 18.4 mg/dL (ref 7.0–26.0)
CO2: 25 mEq/L (ref 22–29)
Calcium: 9.7 mg/dL (ref 8.4–10.4)
Chloride: 107 mEq/L (ref 98–109)
Creatinine: 1.1 mg/dL (ref 0.6–1.1)
EGFR: 49 mL/min/{1.73_m2} — ABNORMAL LOW (ref >90)
Glucose: 101 mg/dl (ref 70–140)
Potassium: 4.9 mEq/L (ref 3.5–5.1)
Sodium: 142 mEq/L (ref 136–145)
Total Bilirubin: 0.32 mg/dL (ref 0.20–1.20)
Total Protein: 7.1 g/dL (ref 6.4–8.3)

## 2016-04-11 MED ORDER — AZITHROMYCIN 250 MG PO TABS: ORAL_TABLET | ORAL | Status: DC

## 2016-04-12 ENCOUNTER — Encounter: Payer: Self-pay | Admitting: Nurse Practitioner

## 2016-04-12 ENCOUNTER — Telehealth: Payer: Self-pay | Admitting: Internal Medicine

## 2016-04-12 DIAGNOSIS — D701 Agranulocytosis secondary to cancer chemotherapy: Secondary | ICD-10-CM | POA: Insufficient documentation

## 2016-04-12 DIAGNOSIS — T451X5A Adverse effect of antineoplastic and immunosuppressive drugs, initial encounter: Secondary | ICD-10-CM | POA: Insufficient documentation

## 2016-04-12 NOTE — Progress Notes (Signed)
SYMPTOM MANAGEMENT CLINIC    Chief Complaint: Cough  HPI:  Tammy Ball 75 y.o. female diagnosed with lung cancer.  Patient is status post chemotherapy completed 03/26/2016 and radiation treatments which were just completed yesterday, 04/10/2016.  Currently undergoing observation only.  Patient reports some mild URI symptoms; and a very congested, productive cough.  She states she's had a fever last night to maximum of 99.6.  Exam today reveals some mild rhonchi to the basis bilaterally; and some mild queasiness well.  Patient does have a congested cough; but no acute respiratory distress.  Chest x-ray obtained today reveal no acute findings.  Will prescribe patient a Z-Pak for treatment of bronchitis.  Also, patient states she R he has a nebulizer at home with albuterol solution that she continues on an as-needed basis.  She also has an albuterol inhaler to take with her when she travels.  Patient was advised to complete all antibiotics as directed.  She may use the albuterol nebulizer at home every 6 hours on an as-needed basis.  She was encouraged to call/return if her symptoms persist.  Also, patient was advised to go directly to the emergency department for any worsening symptoms whatsoever. Oncology History   Patient followed due to hx NSCLC stage IIA squamous cell dx 09/2015 s/p neoadjuvant chemotherapy   Non-small cell carcinoma of right lung, stage 2 ([place])   Staging form: Lung, AJCC 7th Edition     Clinical stage from 10/03/2015: Stage IIA (T2a, N1, M0) - Signed by Curt Bears, MD on 10/03/2015 Renal cell cancer [facility])   Staging form: Kidney, AJCC 7th Edition     Clinical: No stage assigned - Unsigned       Non-small cell carcinoma of right lung, stage 2 ([place])   09/19/2015 Imaging CTA Irregular opacity in the medial right upper lobe extending to and possibly arising from the right hilum.    09/20/2015 Surgery VIDEO BRONCHOSCOPY WITHOUT FLUORO   09/29/2015 Imaging  PET IMPRESSION: 4 cm hypermetabolic central right upper lobe mass, consistent with primary bronchogenic carcinoma. This is contiguous with the right hilum.   10/03/2015 Initial Diagnosis Non-small cell carcinoma of right lung, stage 2 ([place])   10/11/2015 - 11/22/2015 Chemotherapy taxol/carbo   10/21/2015 Imaging MRI Brain No pathologic enhancement to suggest metastatic disease of the brain or meninges.   11/19/2015 Imaging CXR IMPRESSION: There is airspace consolidation in the right upper lobe medially   12/13/2015 Imaging CT Chest IMPRESSION: Marked response to therapy. Significant decrease in size of a central right upper lobe pulmonary nodule with increase in cavitation.   01/18/2016 Surgery PROCEDURES: Video bronchoscopy, right video-assisted thoracoscopy, mini thoracotomy, right upper lobectomy with lymph node dissection and placement of On-Q device.   02/21/2016 -  Radiation Therapy Adjuvant tx [facility]   02/27/2016 -  Chemotherapy 1st adjuvant chemotherapy Carbo/Taxol    Review of Systems  Constitutional: Positive for fever, chills and malaise/fatigue.  HENT: Positive for congestion.   Respiratory: Positive for cough, sputum production and wheezing.   All other systems reviewed and are negative.   Past Medical History  Diagnosis Date  . COPD (chronic obstructive pulmonary disease) ([place])   . Hypertension   . Depression   . Asthma   . Glaucoma   . Hyperlipidemia   . Heart murmur   . Non-small cell carcinoma of right lung, stage 2 ([place]) 10/03/2015  . Chemotherapy-induced neuropathy ([place]) 11/01/2015  . History of nuclear stress test     Myoview 2/17: no ischemia  or scar, EF 79%; low risk  . History of echocardiogram     Echo 2/17: EF 09-32%, grade 1 diastolic dysfunction, mild MR, trivial pericardial effusion  . Renal cell carcinoma ([place])     L nephrectomy  in 2012  . Anxiety   . Claustrophobia   . Headache     prior to menopause  . Anemia     was while doing chemo  . Encounter for  antineoplastic chemotherapy 02/09/2016    Past Surgical History  Procedure Laterality Date  . Nephrectomy    . Kidney cancer    . Tubal ligation    . Spine surgery    . Eye surgery    . Video bronchoscopy Bilateral 09/20/2015    Procedure: VIDEO BRONCHOSCOPY WITHOUT FLUORO;  Surgeon: Rigoberto Noel, MD;  Location: [facility];  Service: Cardiopulmonary;  Laterality: Bilateral;  . Back surgery      cervical 1991  . Video bronchoscopy N/A 01/18/2016    Procedure: VIDEO BRONCHOSCOPY;  Surgeon: [name], MD;  [name];  Service: Thoracic;  Laterality: N/A;  . Video assisted thoracoscopy (vats)/wedge resection Right 01/18/2016    Procedure: VIDEO ASSISTED THORACOSCOPY (VATS)/LUNG RESECTION, THOROCOTOMY, RIGHT UPPER LOBECTOMY, LYMPH NODE DISSECTION, PLACEMENT OF ON Q;  Surgeon: [name], MD;  Location: [place];  Service: Thoracic;  Laterality: Right;    has Essential hypertension; COPD GOLD II ; Hearing loss; Renal cell cancer ([place]); Sciatica of left side; Depression; Glaucoma; Spondylolisthesis of lumbar region; Hyperlipidemia; Morbid obesity ([place]); Non-small cell carcinoma of right lung, stage 2 ([place]); Chemotherapy-induced neuropathy ([place]); S/P lobectomy of lung; Bronchitis; and Chemotherapy induced neutropenia ([place]) on her problem list.    is allergic to bee venom; amlodipine; levofloxacin; and hctz.    Medication List       This list is accurate as of: 04/11/16 11:59 PM.  Always use your most recent med list.               acetaminophen 500 MG tablet  Commonly known as:  TYLENOL  Take 1,000 mg by mouth every 6 (six) hours as needed for mild pain, moderate pain, fever or headache. Reported on 04/11/2016     albuterol 108 (90 Base) MCG/ACT inhaler  Commonly known as:  PROVENTIL HFA;VENTOLIN HFA  Inhale 2 puffs into the lungs every 4 (four) hours as needed for wheezing or shortness of breath.     aspirin EC 81 MG tablet  Take 81 mg by mouth daily. Reported on  04/11/2016     atorvastatin 40 MG tablet  Commonly known as:  LIPITOR  TAKE ONE TABLET BY MOUTH ONCE DAILY.     azithromycin 250 MG tablet  Commonly known as:  ZITHROMAX Z-PAK  Take 2 tabs PO on day # 1; then take 1 tab PO QD till gone.     benzonatate 200 MG capsule  Commonly known as:  TESSALON  Take 1 capsule (200 mg total) by mouth 3 (three) times daily as needed for cough.     calcium carbonate 1500 (600 Ca) MG Tabs tablet  Commonly known as:  OSCAL  Take by mouth 2 (two) times daily with a meal.     famotidine 20 MG tablet  Commonly known as:  PEPCID  Take 20 mg by mouth at bedtime.     fish oil-omega-3 fatty acids 1000 MG capsule  Take 1 capsule by mouth daily.     fluticasone 50 MCG/ACT nasal spray  Commonly known as:  FLONASE  Place 2 sprays into both nostrils daily.     Fluticasone-Salmeterol 100-50 MCG/DOSE Aepb  Commonly known as:  ADVAIR  Inhale 1 puff into the lungs 2 (two) times daily.     hyaluronate sodium Gel  Apply 1 application topically 2 (two) times daily.     LORazepam 0.5 MG tablet  Commonly known as:  ATIVAN  Take 1 tablet (0.5 mg total) by mouth 2 (two) times daily as needed for anxiety.     mometasone-formoterol 200-5 MCG/ACT Aero  Commonly known as:  DULERA  Inhale 2 puffs into the lungs 2 (two) times daily.     oxyCODONE-acetaminophen 5-325 MG tablet  Commonly known as:  PERCOCET/ROXICET  Take 1-2 tablets by mouth every 6 (six) hours as needed for severe pain.     pseudoephedrine-guaifenesin 60-600 MG 12 hr tablet  Commonly known as:  MUCINEX D  Take 1 tablet by mouth every 12 (twelve) hours. Reported on 04/11/2016     REFRESH 1.4-0.6 % ophthalmic solution  Generic drug:  polyvinyl alcohol-povidone  Place 2 drops into both eyes daily as needed (for dry eyes).     sucralfate 1 g tablet  Commonly known as:  CARAFATE  Take 1 tablet (1 g total) by mouth 4 (four) times daily -  with meals and at bedtime. Mix in a few teaspoons of water  as a slurry     traMADol 50 MG tablet  Commonly known as:  ULTRAM  Reported on 04/11/2016     UNABLE TO FIND  Med Name: Wig     valsartan 80 MG tablet  Commonly known as:  DIOVAN  Take 1 tablet (80 mg total) by mouth daily.         PHYSICAL EXAMINATION  Oncology Vitals 04/11/2016 04/10/2016  Height 163 cm 163 cm  Weight 79.289 kg 79.243 kg  Weight (lbs) 174 lbs 13 oz 174 lbs 11 oz  BMI (kg/m2) 30 kg/m2 29.99 kg/m2  Temp 98.9 98.4  Pulse 109 117  Resp 18 -  SpO2 92 97  BSA (m2) 1.89 m2 1.89 m2   BP Readings from Last 2 Encounters:  04/11/16 132/76  04/10/16 140/80    Physical Exam  Constitutional: She is oriented to person, place, and time and well-developed, well-nourished, and in no distress.  HENT:  Head: Normocephalic and atraumatic.  Mouth/Throat: Oropharynx is clear and moist.  Mild nasal congestion only.  No facial tenderness with palpation.  Eyes: Conjunctivae and EOM are normal. Pupils are equal, round, and reactive to light. Right eye exhibits no discharge. Left eye exhibits no discharge. No scleral icterus.  Neck: Normal range of motion. Neck supple. No JVD present. No tracheal deviation present. No thyromegaly present.  Cardiovascular: Normal rate, regular rhythm, normal heart sounds and intact distal pulses.   Pulmonary/Chest: Effort normal. No respiratory distress. She has wheezes. She has rales. She exhibits no tenderness.  Abdominal: Soft. Bowel sounds are normal. She exhibits no distension and no mass. There is no tenderness. There is no rebound and no guarding.  Musculoskeletal: Normal range of motion. She exhibits no edema or tenderness.  Lymphadenopathy:    She has no cervical adenopathy.  Neurological: She is alert and oriented to person, place, and time. Gait normal.  Skin: Skin is warm and dry. No rash noted. No erythema. No pallor.  Psychiatric: Affect normal.    LABORATORY DATA:. Appointment on 04/11/2016  Component Date Value Ref Range  Status  . WBC 04/11/2016 2.0* 3.9 - 10.3  10e3/uL Final  . NEUT# 04/11/2016 0.6* 1.5 - 6.5 10e3/uL Final  . HGB 04/11/2016 10.9* 11.6 - 15.9 g/dL Final  . HCT 04/11/2016 33.9* 34.8 - 46.6 % Final  . Platelets 04/11/2016 361  145 - 400 10e3/uL Final  . MCV 04/11/2016 94.6  79.5 - 101.0 fL Final  . MCH 04/11/2016 30.4  25.1 - 34.0 pg Final  . MCHC 04/11/2016 32.1  31.5 - 36.0 g/dL Final  . RBC 04/11/2016 3.58* 3.70 - 5.45 10e6/uL Final  . RDW 04/11/2016 17.4* 11.2 - 14.5 % Final  . lymph# 04/11/2016 0.7* 0.9 - 3.3 10e3/uL Final  . MONO# 04/11/2016 0.7  0.1 - 0.9 10e3/uL Final  . Eosinophils Absolute 04/11/2016 0.0  0.0 - 0.5 10e3/uL Final  . Basophils Absolute 04/11/2016 0.0  0.0 - 0.1 10e3/uL Final  . NEUT% 04/11/2016 30.1* 38.4 - 76.8 % Final  . LYMPH% 04/11/2016 32.8  14.0 - 49.7 % Final  . MONO% 04/11/2016 35.4* 0.0 - 14.0 % Final  . EOS% 04/11/2016 0.6  0.0 - 7.0 % Final  . BASO% 04/11/2016 1.1  0.0 - 2.0 % Final  . Sodium 04/11/2016 142  136 - 145 mEq/L Final  . Potassium 04/11/2016 4.9  3.5 - 5.1 mEq/L Final  . Chloride 04/11/2016 107  98 - 109 mEq/L Final  . CO2 04/11/2016 25  22 - 29 mEq/L Final  . Glucose 04/11/2016 101  70 - 140 mg/dl Final   Glucose reference range is for nonfasting patients. Fasting glucose reference range is 70- 100.  Marland Kitchen BUN 04/11/2016 18.4  7.0 - 26.0 mg/dL Final  . Creatinine 04/11/2016 1.1  0.6 - 1.1 mg/dL Final  . Total Bilirubin 04/11/2016 0.32  0.20 - 1.20 mg/dL Final  . Alkaline Phosphatase 04/11/2016 87  40 - 150 U/L Final  . AST 04/11/2016 16  5 - 34 U/L Final  . ALT 04/11/2016 16  0 - 55 U/L Final  . Total Protein 04/11/2016 7.1  6.4 - 8.3 g/dL Final  . Albumin 04/11/2016 3.7  3.5 - 5.0 g/dL Final  . Calcium 04/11/2016 9.7  8.4 - 10.4 mg/dL Final  . Anion Gap 04/11/2016 9  3 - 11 mEq/L Final  . EGFR 04/11/2016 49* >90 ml/min/1.73 m2 Final   eGFR is calculated using the CKD-EPI Creatinine Equation (2009)    RADIOGRAPHIC STUDIES: Dg Chest 2  View  04/11/2016  CLINICAL DATA:  History of lung cancer post right upper lobectomy in March. Productive cough and low-grade fever since beginning radiation. EXAM: CHEST  2 VIEW COMPARISON:  03/07/2016 FINDINGS: Lungs are adequately inflated with minimal volume loss of the right lung post right upper lobectomy. No focal consolidation, effusion or pneumothorax. Cardiomediastinal silhouette is within normal. There is calcified plaque over the aortic arch. Fusion hardware is present over the cervical thoracic junction. There are minimal degenerative changes of the spine. IMPRESSION: No acute cardiopulmonary disease. Electronically Signed   By: Marin Olp M.D.   On: 04/11/2016 08:30    ASSESSMENT/PLAN:    Non-small cell carcinoma of right lung, stage 2 ([place]) Patient completed her final cycle of carboplatin/Taxol chemotherapy on 03/26/2016.  She completed her last radiation treatment yesterday, 04/10/2016.  Patient is scheduled for a restaging CT of the chest on 05/11/2016.  She is scheduled for labs on 05/22/2016.  Patient is scheduled for follow-up visit with Dr. Julien Nordmann to review scan results on 05/29/2016.  However,-will ask schedulers to change this appointment to an earlier date so patient won't  have to wait so long to review her scan results.  Bronchitis Patient reports some mild URI symptoms; and a very congested, productive cough.  She states she's had a fever last night to maximum of 99.6.  Exam today reveals some mild rhonchi to the basis bilaterally; and some mild queasiness well.  Patient does have a congested cough; but no acute respiratory distress.  Chest x-ray obtained today reveal no acute findings.  Will prescribe patient a Z-Pak for treatment of bronchitis.  Also, patient states she R he has a nebulizer at home with albuterol solution that she continues on an as-needed basis.  She also has an albuterol inhaler to take with her when she travels.  Patient was advised to complete  all antibiotics as directed.  She may use the albuterol nebulizer at home every 6 hours on an as-needed basis.  She was encouraged to call/return if her symptoms persist.  Also, patient was advised to go directly to the emergency department for any worsening symptoms whatsoever.  Chemotherapy induced neutropenia ([place]) Patient last received chemotherapy on 03/26/2016.  However, blood counts obtained today reveal a WBC of 2.0, ANC 0.6, hemoglobin 10.9, platelet count 361.  Patient was advised of need for neutropenia precautions.  Patient was advised to call if she develops any worsening symptoms whatsoever.  Hopefully, patient's blood counts will continue to improve since she has completed all of her chemotherapy now.   Patient stated understanding of all instructions; and was in agreement with this plan of care. The patient knows to call the clinic with any problems, questions or concerns.   Total time spent with patient was 25 minutes;  with greater than 75 percent of that time spent in face to face counseling regarding patient's symptoms,  and coordination of care and follow up.  Disclaimer:This dictation was prepared with Dragon/digital dictation along with [employer]. Any transcriptional errors that result from this process are unintentional.  Drue Second, NP 04/12/2016

## 2016-04-12 NOTE — Telephone Encounter (Signed)
left msg confirming 7/5 apt

## 2016-04-12 NOTE — Assessment & Plan Note (Signed)
Patient reports some mild URI symptoms; and a very congested, productive cough.  She states she's had a fever last night to maximum of 99.6.  Exam today reveals some mild rhonchi to the basis bilaterally; and some mild queasiness well.  Patient does have a congested cough; but no acute respiratory distress.  Chest x-ray obtained today reveal no acute findings.  Will prescribe patient a Z-Pak for treatment of bronchitis.  Also, patient states she R he has a nebulizer at home with albuterol solution that she continues on an as-needed basis.  She also has an albuterol inhaler to take with her when she travels.  Patient was advised to complete all antibiotics as directed.  She may use the albuterol nebulizer at home every 6 hours on an as-needed basis.  She was encouraged to call/return if her symptoms persist.  Also, patient was advised to go directly to the emergency department for any worsening symptoms whatsoever.

## 2016-04-12 NOTE — Assessment & Plan Note (Signed)
Patient completed her final cycle of carboplatin/Taxol chemotherapy on 03/26/2016.  She completed her last radiation treatment yesterday, 04/10/2016.  Patient is scheduled for a restaging CT of the chest on 05/11/2016.  She is scheduled for labs on 05/22/2016.  Patient is scheduled for follow-up visit with Dr. Julien Nordmann to review scan results on 05/29/2016.  However,-will ask schedulers to change this appointment to an earlier date so patient won't have to wait so long to review her scan results.

## 2016-04-12 NOTE — Assessment & Plan Note (Signed)
Patient last received chemotherapy on 03/26/2016.  However, blood counts obtained today reveal a WBC of 2.0, ANC 0.6, hemoglobin 10.9, platelet count 361.  Patient was advised of need for neutropenia precautions.  Patient was advised to call if she develops any worsening symptoms whatsoever.  Hopefully, patient's blood counts will continue to improve since she has completed all of her chemotherapy now.

## 2016-04-17 ENCOUNTER — Telehealth: Payer: Self-pay | Admitting: Nurse Practitioner

## 2016-04-17 NOTE — Telephone Encounter (Signed)
Called patient to check in on her.  She has completed the Zithromax antibiotics.  She was prescribed for presumed bronchitis.  She states that her cough is much better now; but she still occasionally coughs up some yellow secretions.  She denies any chest pain, chest pressure, or shortness of breath.  Chills denies any pain with inspiration.  She states she does continue to use the nebulizers at home when needed.  She denies any recent fevers or chills.  Patient was advised to call/return or go directly to the emergency department for any worsening symptoms whatsoever.  Also, discussed option of trying a telemedicine visit via the computer to check in with the patient as well.  Patient was agreeable to trying the telemedicine visit; and will arrange for the first telemedicine visit this coming Friday, 04/20/2016.

## 2016-04-17 NOTE — Progress Notes (Signed)
  Radiation Oncology         ([phone] ________________________________  Name: Tammy Ball MRN: [id number]  Date: 04/10/2016  DOB: 1941/10/30  End of Treatment Note  ICD-9-CM ICD-10-CM     1. Non-small cell carcinoma of right lung, stage 2 ([facility]) 162.9 C34.91     DIAGNOSIS: Stage III (ypT2a, pN2, M0) non-small cell lung cancer, squamous cell carcinoma     Indication for treatment: Curative along with radiosensitizing chemotherapy  Radiation treatment dates:   02/29/2016-04/10/2016  Site/dose: 50.4 Gy in 28 fractions to the right central chest  Beams/energy: 3D-Conformal Other/ 10X, 6X Photon  Narrative: The patient tolerated radiation treatment relatively well. The patient reported a productive cough, fatigue, shortness of breath, a sore throat, and difficulty swallowing.  Plan: The patient has completed radiation treatment. The patient will return to radiation oncology clinic for routine followup in one month. I advised them to call or return sooner if they have any questions or concerns related to their recovery or treatment.  -----------------------------------  Blair Promise, PhD, MD  This document serves as a record of services personally performed by Gery Pray, MD. It was created on his behalf by Darcus Austin, a trained medical scribe. The creation of this record is based on the scribe's personal observations and the provider's statements to them. This document has been checked and approved by the attending provider.

## 2016-04-18 ENCOUNTER — Encounter: Payer: Self-pay | Admitting: *Deleted

## 2016-04-18 ENCOUNTER — Telehealth: Payer: Self-pay | Admitting: *Deleted

## 2016-04-18 NOTE — Telephone Encounter (Signed)
TC to pt re message left this morning. Video Visit rescheduled to Friday morning at 1030am. Pt scheduled appt using a ticket for Tuesday and wishes to cancel this appt. Changed appt time for Friday and Tuesday appt was cancelled.  Email link sent to patient via mychart for Video Visit appt link.

## 2016-04-18 NOTE — Telephone Encounter (Signed)
Pt called and requested to reschedule appt with Jenny Reichmann, NP symptom management from 6/9 to another day.  Message sent to [facility] provider and nurse. Pt's   Phone     [phone].

## 2016-04-20 ENCOUNTER — Telehealth: Payer: Self-pay | Admitting: *Deleted

## 2016-04-20 ENCOUNTER — Telehealth: Payer: Medicare Other | Admitting: Nurse Practitioner

## 2016-04-20 NOTE — Telephone Encounter (Signed)
"  I need to talk with the nurse about my video visit.  I could not log on this morning to MyChart bit now I can.  Call transferred to ext [phone]."

## 2016-04-24 ENCOUNTER — Telehealth: Payer: Self-pay | Admitting: Nurse Practitioner

## 2016-05-11 ENCOUNTER — Other Ambulatory Visit (HOSPITAL_BASED_OUTPATIENT_CLINIC_OR_DEPARTMENT_OTHER): Payer: Medicare Other

## 2016-05-11 ENCOUNTER — Ambulatory Visit (HOSPITAL_COMMUNITY)
Admission: RE | Admit: 2016-05-11 | Discharge: 2016-05-11 | Disposition: A | Discharge status: Home / Self Care | Payer: Medicare Other | Source: Ambulatory Visit | Attending: Internal Medicine | Admitting: Internal Medicine

## 2016-05-11 ENCOUNTER — Encounter (HOSPITAL_COMMUNITY): Payer: Self-pay

## 2016-05-11 DIAGNOSIS — R911 Solitary pulmonary nodule: Secondary | ICD-10-CM | POA: Insufficient documentation

## 2016-05-11 DIAGNOSIS — Z5111 Encounter for antineoplastic chemotherapy: Secondary | ICD-10-CM | POA: Insufficient documentation

## 2016-05-11 DIAGNOSIS — C3411 Malignant neoplasm of upper lobe, right bronchus or lung: Secondary | ICD-10-CM

## 2016-05-11 DIAGNOSIS — C3491 Malignant neoplasm of unspecified part of right bronchus or lung: Secondary | ICD-10-CM

## 2016-05-11 LAB — CBC WITH DIFFERENTIAL/PLATELET
BASO%: 0.5 % (ref 0.0–2.0)
Basophils Absolute: 0 10*3/uL (ref 0.0–0.1)
EOS%: 2.6 % (ref 0.0–7.0)
Eosinophils Absolute: 0.2 10*3/uL (ref 0.0–0.5)
HCT: 36.6 % (ref 34.8–46.6)
HGB: 11.8 g/dL (ref 11.6–15.9)
LYMPH%: 16.6 % (ref 14.0–49.7)
MCH: 30.3 pg (ref 25.1–34.0)
MCHC: 32.2 g/dL (ref 31.5–36.0)
MCV: 93.8 fL (ref 79.5–101.0)
MONO#: 0.8 10*3/uL (ref 0.1–0.9)
MONO%: 11.8 % (ref 0.0–14.0)
NEUT#: 4.5 10*3/uL (ref 1.5–6.5)
NEUT%: 68.5 % (ref 38.4–76.8)
Platelets: 320 10*3/uL (ref 145–400)
RBC: 3.9 10*6/uL (ref 3.70–5.45)
RDW: 19.4 % — ABNORMAL HIGH (ref 11.2–14.5)
WBC: 6.6 10*3/uL (ref 3.9–10.3)
lymph#: 1.1 10*3/uL (ref 0.9–3.3)

## 2016-05-11 LAB — COMPREHENSIVE METABOLIC PANEL
ALT: 16 U/L (ref 0–55)
AST: 17 U/L (ref 5–34)
Albumin: 3.8 g/dL (ref 3.5–5.0)
Alkaline Phosphatase: 110 U/L (ref 40–150)
Anion Gap: 10 mEq/L (ref 3–11)
BUN: 17.5 mg/dL (ref 7.0–26.0)
CO2: 25 mEq/L (ref 22–29)
Calcium: 9.9 mg/dL (ref 8.4–10.4)
Chloride: 104 mEq/L (ref 98–109)
Creatinine: 1.1 mg/dL (ref 0.6–1.1)
EGFR: 48 mL/min/{1.73_m2} — ABNORMAL LOW (ref >90)
Glucose: 109 mg/dl (ref 70–140)
Potassium: 4.7 mEq/L (ref 3.5–5.1)
Sodium: 139 mEq/L (ref 136–145)
Total Bilirubin: 0.37 mg/dL (ref 0.20–1.20)
Total Protein: 7.6 g/dL (ref 6.4–8.3)

## 2016-05-11 MED ORDER — IOPAMIDOL (ISOVUE-300) INJECTION 61%
75.0000 mL | Freq: Once | INTRAVENOUS | Status: AC | PRN
  Administered 2016-05-11: 50 mL via INTRAVENOUS

## 2016-05-11 MED ORDER — IOPAMIDOL (ISOVUE-300) INJECTION 61%: 75.0000 mL | Freq: Once | INTRAVENOUS | Status: DC | PRN

## 2016-05-16 ENCOUNTER — Ambulatory Visit (HOSPITAL_BASED_OUTPATIENT_CLINIC_OR_DEPARTMENT_OTHER): Payer: Medicare Other | Admitting: Internal Medicine

## 2016-05-16 ENCOUNTER — Telehealth: Payer: Self-pay | Admitting: Internal Medicine

## 2016-05-16 ENCOUNTER — Encounter: Payer: Self-pay | Admitting: Internal Medicine

## 2016-05-16 VITALS — BP 137/71 | HR 113 | Temp 98.2°F | Resp 18 | Ht 64.0 in | Wt 172.0 lb

## 2016-05-16 DIAGNOSIS — C3491 Malignant neoplasm of unspecified part of right bronchus or lung: Secondary | ICD-10-CM

## 2016-05-16 DIAGNOSIS — C771 Secondary and unspecified malignant neoplasm of intrathoracic lymph nodes: Secondary | ICD-10-CM

## 2016-05-16 DIAGNOSIS — G4700 Insomnia, unspecified: Secondary | ICD-10-CM | POA: Diagnosis not present

## 2016-05-16 DIAGNOSIS — T451X5A Adverse effect of antineoplastic and immunosuppressive drugs, initial encounter: Secondary | ICD-10-CM

## 2016-05-16 DIAGNOSIS — G620 Drug-induced polyneuropathy: Secondary | ICD-10-CM

## 2016-05-16 HISTORY — DX: Insomnia, unspecified: G47.00

## 2016-05-16 NOTE — Telephone Encounter (Signed)
Gave and printed appt sched and avs fo rpt for OCT °

## 2016-05-16 NOTE — Progress Notes (Signed)
[place] Telephone:([phone]) [phone]   Fax:([phone]) [place], MD [address] [place] [address]  DIAGNOSIS: Stage IIIA (T2a, N2, M0) non-small cell lung cancer, squamous cell carcinoma presented with large right upper lobe lung mass with right hilar involvement diagnosed in November 2016.  PRIOR THERAPY:  1) Neoadjuvant systemic chemotherapy with carboplatin for AUC of 6 and paclitaxel 200 MG/M2 every 3 weeks, status post 3 cycles with partial response. 2) Video bronchoscopy, right video-assisted thoracoscopy, minithoracotomy, right upper lobectomy with lymph node dissection on 01/18/2016. The final pathology revealed residual squamous cell carcinoma measuring 3.1 cm with close resection margin and metastatic carcinoma to lymph nodes at levels 10R, 12R and 4R. (ypT2a, yN2).  3) Concurrent chemoradiation with weekly carboplatin for AUC of 2 and paclitaxel 45 MG/M2. First dose 02/27/2016. Status post 5 cycles. Last dose was given 03/26/2016.  CURRENT THERAPY: Observation.  INTERVAL HISTORY: Tammy Ball 75 y.o. female returns to the clinic today for follow-up visit accompanied by her son. She completed a course of concurrent chemoradiation with weekly carboplatin and paclitaxel is status post 5 cycles. She tolerated her treatment fairly well with no significant adverse effects except for mild fatigue. She denied having any significant weight loss or night sweats. She denied having any nausea or vomiting, no fever or chills. She has no significant shortness of breath, cough or hemoptysis. She complains today of insomnia. She had repeat CT scan of the chest performed recently and she is here for evaluation and discussion of her scan results.  MEDICAL HISTORY: Past Medical History  Diagnosis Date  . COPD (chronic obstructive pulmonary disease) ([place])   . Hypertension   . Depression   . Asthma   . Glaucoma   . Hyperlipidemia   .  Heart murmur   . Non-small cell carcinoma of right lung, stage 2 ([place]) 10/03/2015  . Chemotherapy-induced neuropathy ([place]) 11/01/2015  . History of nuclear stress test     Myoview 2/17: no ischemia or scar, EF 79%; low risk  . History of echocardiogram     Echo 2/17: EF 14-97%, grade 1 diastolic dysfunction, mild MR, trivial pericardial effusion  . Renal cell carcinoma ([place])     L nephrectomy  in 2012  . Anxiety   . Claustrophobia   . Headache     prior to menopause  . Anemia     was while doing chemo  . Encounter for antineoplastic chemotherapy 02/09/2016    ALLERGIES:  is allergic to bee venom; amlodipine; levofloxacin; and hctz.  MEDICATIONS:  Current Outpatient Prescriptions  Medication Sig Dispense Refill  . acetaminophen (TYLENOL) 500 MG tablet Take 1,000 mg by mouth every 6 (six) hours as needed for mild pain, moderate pain, fever or headache. Reported on 04/11/2016    . aspirin EC 81 MG tablet Take 81 mg by mouth daily. Reported on 04/11/2016    . atorvastatin (LIPITOR) 40 MG tablet TAKE ONE TABLET BY MOUTH ONCE DAILY. 90 tablet 3  . calcium carbonate (OSCAL) 1500 (600 Ca) MG TABS tablet Take by mouth 2 (two) times daily with a meal.    . fish oil-omega-3 fatty acids 1000 MG capsule Take 1 capsule by mouth daily.     . fluticasone (FLONASE) 50 MCG/ACT nasal spray Place 2 sprays into both nostrils daily. 9.9 g 0  . polyvinyl alcohol-povidone (REFRESH) 1.4-0.6 % ophthalmic solution Place 2 drops into both eyes daily as needed (for dry eyes).    Marland Kitchen  traMADol (ULTRAM) 50 MG tablet Reported on 04/11/2016    . UNABLE TO FIND Med Name: Wig 1 each 0  . valsartan (DIOVAN) 80 MG tablet Take 1 tablet (80 mg total) by mouth daily. 90 tablet 0  . albuterol (PROVENTIL HFA;VENTOLIN HFA) 108 (90 BASE) MCG/ACT inhaler Inhale 2 puffs into the lungs every 4 (four) hours as needed for wheezing or shortness of breath. (Patient not taking: Reported on 04/11/2016) 1 Inhaler 0  . Fluticasone-Salmeterol  (ADVAIR) 100-50 MCG/DOSE AEPB Inhale 1 puff into the lungs 2 (two) times daily. (Patient not taking: Reported on 03/27/2016) 1 each 11  . LORazepam (ATIVAN) 0.5 MG tablet Take 1 tablet (0.5 mg total) by mouth 2 (two) times daily as needed for anxiety. (Patient not taking: Reported on 04/10/2016) 10 tablet 1  . mometasone-formoterol (DULERA) 200-5 MCG/ACT AERO Inhale 2 puffs into the lungs 2 (two) times daily. (Patient not taking: Reported on 04/03/2016) 3 Inhaler 0  . oxyCODONE-acetaminophen (PERCOCET/ROXICET) 5-325 MG tablet Take 1-2 tablets by mouth every 6 (six) hours as needed for severe pain. (Patient not taking: Reported on 04/03/2016) 30 tablet 0  . pseudoephedrine-guaifenesin (MUCINEX D) 60-600 MG 12 hr tablet Take 1 tablet by mouth every 12 (twelve) hours. Reported on 05/16/2016     No current facility-administered medications for this visit.    SURGICAL HISTORY:  Past Surgical History  Procedure Laterality Date  . Nephrectomy    . Kidney cancer    . Tubal ligation    . Spine surgery    . Eye surgery    . Video bronchoscopy Bilateral 09/20/2015    Procedure: VIDEO BRONCHOSCOPY WITHOUT FLUORO;  Surgeon: Rigoberto Noel, MD;  Location: [facility];  Service: Cardiopulmonary;  Laterality: Bilateral;  . Back surgery      cervical 1991  . Video bronchoscopy N/A 01/18/2016    Procedure: VIDEO BRONCHOSCOPY;  Surgeon: [name], MD;  Location: [facility];  Service: Thoracic;  Laterality: N/A;  . Video assisted thoracoscopy (vats)/wedge resection Right 01/18/2016    Procedure: VIDEO ASSISTED THORACOSCOPY (VATS)/LUNG RESECTION, THOROCOTOMY, RIGHT UPPER LOBECTOMY, LYMPH NODE DISSECTION, PLACEMENT OF ON Q;  Surgeon: [name], MD;  Location: [place];  Service: Thoracic;  Laterality: Right;    REVIEW OF SYSTEMS:  Constitutional: positive for fatigue Eyes: negative Ears, nose, mouth, throat, and face: negative Respiratory: negative Cardiovascular: negative Gastrointestinal:  negative Genitourinary:negative Integument/breast: negative Hematologic/lymphatic: negative Musculoskeletal:negative Neurological: negative Behavioral/Psych: positive for sleep disturbance Endocrine: negative Allergic/Immunologic: negative   PHYSICAL EXAMINATION: General appearance: alert, cooperative, fatigued and no distress Head: Normocephalic, without obvious abnormality, atraumatic Neck: no adenopathy, no JVD, supple, symmetrical, trachea midline and thyroid not enlarged, symmetric, no tenderness/mass/nodules Lymph nodes: Cervical, supraclavicular, and axillary nodes normal. Resp: clear to auscultation bilaterally Back: symmetric, no curvature. ROM normal. No CVA tenderness. Cardio: regular rate and rhythm, S1, S2 normal, no murmur, click, rub or gallop GI: soft, non-tender; bowel sounds normal; no masses,  no organomegaly Extremities: extremities normal, atraumatic, no cyanosis or edema Neurologic: Alert and oriented X 3, normal strength and tone. Normal symmetric reflexes. Normal coordination and gait  ECOG PERFORMANCE STATUS: 1 - Symptomatic but completely ambulatory  Blood pressure 137/71, pulse 113, temperature 98.2 F (36.8 C), resp. rate 18, height '5\' 4"'$  (1.626 m), weight 172 lb (78.019 kg), SpO2 96 %.  LABORATORY DATA: Lab Results  Component Value Date   WBC 6.6 05/11/2016   HGB 11.8 05/11/2016   HCT 36.6 05/11/2016   MCV 93.8 05/11/2016  PLT 320 05/11/2016      Chemistry      Component Value Date/Time   NA 139 05/11/2016 0752   NA 137 01/23/2016 0301   K 4.7 05/11/2016 0752   K 4.5 01/23/2016 0301   CL 98* 01/23/2016 0301   CO2 25 05/11/2016 0752   CO2 33* 01/23/2016 0301   BUN 17.5 05/11/2016 0752   BUN 20 01/23/2016 0301   BUN 24* 12/11/2011   CREATININE 1.1 05/11/2016 0752   CREATININE 1.03* 01/23/2016 0301   CREATININE 1.08 12/02/2013 0911   GLU 105 12/10/2009      Component Value Date/Time   CALCIUM 9.9 05/11/2016 0752   CALCIUM 9.1  01/23/2016 0301   ALKPHOS 110 05/11/2016 0752   ALKPHOS 61 01/20/2016 0425   AST 17 05/11/2016 0752   AST 22 01/20/2016 0425   ALT 16 05/11/2016 0752   ALT 15 01/20/2016 0425   BILITOT 0.37 05/11/2016 0752   BILITOT 0.3 01/20/2016 0425       RADIOGRAPHIC STUDIES: Ct Chest W Contrast  05/11/2016  CLINICAL DATA:  Restaging RIGHT lung cancer diagnosed 2016 EXAM: CT CHEST WITH CONTRAST TECHNIQUE: Multidetector CT imaging of the chest was performed during intravenous contrast administration. CONTRAST:  52m ISOVUE-300 IOPAMIDOL (ISOVUE-300) INJECTION 61% COMPARISON:  Chest CT 12/13/2015, PET-CT 09/29/2015 the FINDINGS: Cardiovascular: Intimal calcification of the thoracic aorta. Coronary calcification. No pericardial fluid. Mediastinum/Nodes: No axillary supraclavicular adenopathy. Mediastinal hilar adenopathy. No pericardial fluid. Esophagus normal. Lungs/Pleura: No evidence of recurrent lung cancer with RIGHT lung. No nodularity in the RIGHT hilar region at site of prior tumor. There is a branching nodular pattern in the RIGHT lower lobe (image 45 series 4). This peripheral branching nodular pattern is new from comparison exam. In the LEFT lower lobe there is a new round nodule measuring 4 mm (image 49, series 4) with a central low-attenuation. Initial lung cancer RIGHT lung had a cavitary component therefore this is a concerning finding. Upper Abdomen: Adrenal gland not completely imaged. No significant change. Musculoskeletal: No aggressive osseous lesion. IMPRESSION: 1. New small nodule in the LEFT lower lobe with central low-attenuation is concerning for metastatic pulmonary nodule. Recommend short interval CT follow-up. 2. New branching nodular pattern in the RIGHT lower lobe is favored post infectious or inflammatory process. 3. No evidence of local recurrence of lung carcinoma at the RIGHT hilum. Electronically Signed   By: SSuzy BouchardM.D.   On: 05/11/2016 10:21    ASSESSMENT AND PLAN:  This is a very pleasant 75years old white female recently diagnosed with stage II a non-small cell lung cancer, squamous cell carcinoma and completed neoadjuvant systemic chemotherapy with carboplatin and paclitaxel status post 3 cycles.  The recent CT scan of the chest showed partial response response with significant decrease in the size of the central right upper lobe pulmonary nodule and increased cavitation.  The patient underwent right upper lobectomy with lymph node dissection that showed residual tumor in addition to metastatic disease to the mediastinal lymph nodes and close resection margin. The patient was started on a course of concurrent chemoradiation with weekly carboplatin and paclitaxel status post 5 cycles. She tolerated the 5 cycles well except for mild fatigue.  The recent CT scan of the chest showed no evidence for disease recurrence except for a suspicious small nodule in the left lower lobe measuring around 4 mm in size. I discussed the scan results and showed the images to the patient and her son. I recommended for her to  continue on observation with repeat CT scan of the chest in 3 months for restaging of her disease. For the insomnia, I will start the patient on Restoril 15 mg by mouth daily when necessary. For the peripheral neuropathy, she will continue on observation for now with close monitoring. She was advised to call immediately if she has any concerning symptoms in the interval. The patient voices understanding of current disease status and treatment options and is in agreement with the current care plan.  All questions were answered. The patient knows to call the clinic with any problems, questions or concerns. We can certainly see the patient much sooner if necessary.  Disclaimer: This note was dictated with voice recognition software. Similar sounding words can inadvertently be transcribed and may not be corrected upon review.

## 2016-05-17 ENCOUNTER — Encounter: Payer: Self-pay | Admitting: Oncology

## 2016-05-22 ENCOUNTER — Other Ambulatory Visit: Payer: Medicare Other

## 2016-05-24 ENCOUNTER — Other Ambulatory Visit: Payer: Self-pay | Admitting: Medical Oncology

## 2016-05-24 ENCOUNTER — Encounter: Payer: Self-pay | Admitting: Cardiothoracic Surgery

## 2016-05-24 ENCOUNTER — Encounter: Payer: Self-pay | Admitting: Radiation Oncology

## 2016-05-24 ENCOUNTER — Telehealth: Payer: Self-pay | Admitting: Oncology

## 2016-05-24 ENCOUNTER — Ambulatory Visit
Admission: RE | Admit: 2016-05-24 | Discharge: 2016-05-24 | Disposition: A | Discharge status: Home / Self Care | Payer: Medicare Other | Source: Ambulatory Visit | Attending: Radiation Oncology | Admitting: Radiation Oncology

## 2016-05-24 ENCOUNTER — Ambulatory Visit (INDEPENDENT_AMBULATORY_CARE_PROVIDER_SITE_OTHER): Payer: Medicare Other | Admitting: Cardiothoracic Surgery

## 2016-05-24 VITALS — BP 106/66 | HR 92 | Resp 16 | Ht 64.0 in | Wt 172.0 lb

## 2016-05-24 VITALS — BP 123/67 | HR 98 | Temp 98.3°F | Resp 20 | Ht 64.0 in | Wt 172.5 lb

## 2016-05-24 DIAGNOSIS — Y842 Radiological procedure and radiotherapy as the cause of abnormal reaction of the patient, or of later complication, without mention of misadventure at the time of the procedure: Secondary | ICD-10-CM | POA: Diagnosis not present

## 2016-05-24 DIAGNOSIS — Z902 Acquired absence of lung [part of]: Secondary | ICD-10-CM

## 2016-05-24 DIAGNOSIS — C3491 Malignant neoplasm of unspecified part of right bronchus or lung: Secondary | ICD-10-CM | POA: Diagnosis not present

## 2016-05-24 DIAGNOSIS — C3411 Malignant neoplasm of upper lobe, right bronchus or lung: Secondary | ICD-10-CM

## 2016-05-24 DIAGNOSIS — G4700 Insomnia, unspecified: Secondary | ICD-10-CM

## 2016-05-24 MED ORDER — TEMAZEPAM 15 MG PO CAPS: 15.0000 mg | ORAL_CAPSULE | Freq: Every evening | ORAL | Status: DC | PRN

## 2016-05-24 NOTE — Telephone Encounter (Addendum)
Left a message for Dr. Worthy Flank nurse.  Asked if a prescription for Restoril will be sent in for insomnia per Dr. Worthy Flank note from 05/16/16.  Requested a return call.

## 2016-05-24 NOTE — Addendum Note (Signed)
Encounter addended by: Jacqulyn Liner, RN on: 05/24/2016  4:25 PM<BR>     Documentation filed: Charges VN

## 2016-05-24 NOTE — Progress Notes (Signed)
Follow up s/p radiation treatment  Right central chest 02/29/16-04/10/16,  No c/o pain but has a congestive cough  Yellowish/white phlegm and and wheezing recently, mucous, appetite good, gets short of breath with exertion room air sats now 93%  Energy level better but still gets tired, works full time,  Swallowing is fine no difficulty BP 123/67 mmHg  Pulse 98  Temp(Src) 98.3 F (36.8 C) (Oral)  Resp 20  Ht '5\' 4"'$  (1.626 m)  Wt 172 lb 8 oz (78.245 kg)  BMI 29.59 kg/m2  SpO2 93%  Wt Readings from Last 3 Encounters:  05/24/16 172 lb 8 oz (78.245 kg)  05/16/16 172 lb (78.019 kg)  04/11/16 174 lb 12.8 oz (79.289 kg)

## 2016-05-24 NOTE — Progress Notes (Signed)
Radiation Oncology         [phone] ________________________________  Name: Tammy Ball MRN: [id number]  Date: 05/24/2016  DOB: September 25, 1941  Follow-Up Visit Note  CC: [name], MD  Curt Bears, MD    ICD-9-CM ICD-10-CM   1. Non-small cell carcinoma of right lung, stage 2 ([place]) 162.9 C34.91     Diagnosis: Stage III (ypT2a, pN2, M0) non-small cell lung cancer, squamous cell carcinoma  Interval Since Last Radiation: 6 weeks  02/29/2016-04/10/2016: 50.4 Gy in 28 fractions to the right central chest  Narrative:  The patient returns today for routine follow-up. CT scan on 05/11/16 shows no evidence of recurrent lung cancer in the right lung with no nodularity in the right hilar region at the site of the prior tumor and a branching nodular pattern in the right lower lobe that is favored to be a post infectious or inflammatory process. There is a new 4 mm round nodule in the left lower lobe with central low-attenuation. The patient saw Dr. Julien Nordmann on 05/16/16 who recommended a repeat scan in the future.  No c/o pain, but has a congestive cough with yellowish/white phlegm and wheezing. She gets short of breath with exertion room air. She has a good appetite. Energy level better, but still gets tired, works full time. Swallowing is fine with no difficulty. She has difficulty sleeping. She tried Tylenol PM, Benadryl, and Melatonin. Dr. Julien Nordmann noted on 05/16/16 that he would start her on Restoril 15 mg, but the patient states she has not received it.  ALLERGIES:  is allergic to bee venom; amlodipine; levofloxacin; and hctz.  Meds: Current Outpatient Prescriptions  Medication Sig Dispense Refill  . aspirin EC 81 MG tablet Take 81 mg by mouth daily. Reported on 04/11/2016    . atorvastatin (LIPITOR) 40 MG tablet TAKE ONE TABLET BY MOUTH ONCE DAILY. 90 tablet 3  . calcium carbonate (OSCAL) 1500 (600 Ca) MG TABS tablet Take by mouth 2 (two) times daily with a meal.    . fish oil-omega-3  fatty acids 1000 MG capsule Take 1 capsule by mouth daily.     . polyvinyl alcohol-povidone (REFRESH) 1.4-0.6 % ophthalmic solution Place 2 drops into both eyes daily as needed (for dry eyes). Reported on 05/24/2016    . UNABLE TO FIND Med Name: Wig 1 each 0  . valsartan (DIOVAN) 80 MG tablet Take 1 tablet (80 mg total) by mouth daily. 90 tablet 0  . acetaminophen (TYLENOL) 500 MG tablet Take 1,000 mg by mouth every 6 (six) hours as needed for mild pain, moderate pain, fever or headache. Reported on 05/24/2016    . albuterol (PROVENTIL HFA;VENTOLIN HFA) 108 (90 BASE) MCG/ACT inhaler Inhale 2 puffs into the lungs every 4 (four) hours as needed for wheezing or shortness of breath. (Patient not taking: Reported on 04/11/2016) 1 Inhaler 0  . fluticasone (FLONASE) 50 MCG/ACT nasal spray Place 2 sprays into both nostrils daily. (Patient not taking: Reported on 05/24/2016) 9.9 g 0  . Fluticasone-Salmeterol (ADVAIR) 100-50 MCG/DOSE AEPB Inhale 1 puff into the lungs 2 (two) times daily. (Patient not taking: Reported on 03/27/2016) 1 each 11  . LORazepam (ATIVAN) 0.5 MG tablet Take 1 tablet (0.5 mg total) by mouth 2 (two) times daily as needed for anxiety. (Patient not taking: Reported on 04/10/2016) 10 tablet 1  . mometasone-formoterol (DULERA) 200-5 MCG/ACT AERO Inhale 2 puffs into the lungs 2 (two) times daily. (Patient not taking: Reported on 04/03/2016) 3 Inhaler 0  . oxyCODONE-acetaminophen (PERCOCET/ROXICET) 5-325  MG tablet Take 1-2 tablets by mouth every 6 (six) hours as needed for severe pain. (Patient not taking: Reported on 04/03/2016) 30 tablet 0  . pseudoephedrine-guaifenesin (MUCINEX D) 60-600 MG 12 hr tablet Take 1 tablet by mouth every 12 (twelve) hours. Reported on 05/24/2016    . temazepam (RESTORIL) 15 MG capsule Take 1 capsule (15 mg total) by mouth at bedtime as needed for sleep. 30 capsule 0  . traMADol (ULTRAM) 50 MG tablet Take 50 mg by mouth every 6 (six) hours as needed. Reported on 05/24/2016       No current facility-administered medications for this encounter.    Physical Findings: The patient is in no acute distress. Patient is alert and oriented.  height is '5\' 4"'$  (1.626 m) and weight is 172 lb 8 oz (78.245 kg). Her oral temperature is 98.3 F (36.8 C). Her blood pressure is 123/67 and her pulse is 98. Her respiration is 20 and oxygen saturation is 93%.   Lungs are clear to auscultation bilaterally. Heart has regular rate and rhythm. No palpable supraclavicular or axillary adenopathy.  Lab Findings: Lab Results  Component Value Date   WBC 6.6 05/11/2016   HGB 11.8 05/11/2016   HCT 36.6 05/11/2016   MCV 93.8 05/11/2016   PLT 320 05/11/2016    Radiographic Findings: Ct Chest W Contrast  05/11/2016  CLINICAL DATA:  Restaging RIGHT lung cancer diagnosed 2016 EXAM: CT CHEST WITH CONTRAST TECHNIQUE: Multidetector CT imaging of the chest was performed during intravenous contrast administration. CONTRAST:  49m ISOVUE-300 IOPAMIDOL (ISOVUE-300) INJECTION 61% COMPARISON:  Chest CT 12/13/2015, PET-CT 09/29/2015 the FINDINGS: Cardiovascular: Intimal calcification of the thoracic aorta. Coronary calcification. No pericardial fluid. Mediastinum/Nodes: No axillary supraclavicular adenopathy. Mediastinal hilar adenopathy. No pericardial fluid. Esophagus normal. Lungs/Pleura: No evidence of recurrent lung cancer with RIGHT lung. No nodularity in the RIGHT hilar region at site of prior tumor. There is a branching nodular pattern in the RIGHT lower lobe (image 45 series 4). This peripheral branching nodular pattern is new from comparison exam. In the LEFT lower lobe there is a new round nodule measuring 4 mm (image 49, series 4) with a central low-attenuation. Initial lung cancer RIGHT lung had a cavitary component therefore this is a concerning finding. Upper Abdomen: Adrenal gland not completely imaged. No significant change. Musculoskeletal: No aggressive osseous lesion. IMPRESSION: 1. New small  nodule in the LEFT lower lobe with central low-attenuation is concerning for metastatic pulmonary nodule. Recommend short interval CT follow-up. 2. New branching nodular pattern in the RIGHT lower lobe is favored post infectious or inflammatory process. 3. No evidence of local recurrence of lung carcinoma at the RIGHT hilum. Electronically Signed   By: SSuzy BouchardM.D.   On: 05/11/2016 10:21    Impression:  The patient is recovering from the effects of radiation.  Plan: She is scheduled to follow up with Dr. MJulien Nordmannon 08/21/16 and follow up with Dr. GServando Snarein the future. She will PRN follow up with radiation oncology. Our nurse will check to see if Dr. MJulien Nordmannwanted to fill Restoril 15 mg for the patient.  ____________________________________ -----------------------------------  JBlair Promise PhD, MD  This document serves as a record of services personally performed by JGery Pray MD. It was created on his behalf by JDarcus Austin a trained medical scribe. The creation of this record is based on the scribe's personal observations and the provider's statements to them. This document has been checked and approved by the attending provider.

## 2016-05-24 NOTE — Progress Notes (Signed)
[address]       ,[place] [address]             [phone]      [name] [place] Medical Record #[id number] Date of Birth: 06-May-1941  Referring: Rigoberto Noel, MD Primary Care: [name], MD  Chief Complaint:   POST OP FOLLOW UP 01/18/2016 OPERATIVE REPORT PREOPERATIVE DIAGNOSIS: Right upper lobe non-small cell carcinoma of he lung, status post chemotherapy treatment. POSTOPERATIVE DIAGNOSIS: Right upper lobe non-small cell carcinoma of the lung, status post chemotherapy treatment. PROCEDURES: Video bronchoscopy, right video-assisted thoracoscopy, mini thoracotomy, right upper lobectomy with lymph node dissection and placement of On-Q device. SURGEON: Lanelle Bal, MD.  Non-small cell carcinoma of right lung, stage 2 [facility])   Staging form: Lung, AJCC 7th Edition     Clinical stage from 10/03/2015: Stage IIA (T2a, N1, M0) - Signed by Curt Bears, MD on 10/03/2015     Pathologic stage from 01/20/2016: Stage IIIA (yT2a, N2, cM0) - Signed by [name], MD on 01/20/2016 Renal cell cancer [facility])   Staging form: Kidney, AJCC 7th Edition     Clinical: No stage assigned - Unsigned  History of Present Illness:     Tammy Ball 75 y.o. female has been followed in the office for diagnosis of Lung Cancer., she  completed 3 cycles of chemo for Squamous cell with large right upper lobe mass diagnosed nov 2016 She has a history h/o copd gold II , frequent sinus infections, hypertension, Bronchoscopy by Dr Elsworth Soho revealed whitish endobrochial mass seen blocking entry of RUl bronchus. She was diagnosed with clinical stage IIa squamous cell carcinoma and underwent 3 cycles of chemotherapy with significant response. On March 8 she underwent right upper lobectomy and node dissection.     PRIOR THERAPY:  1) Neoadjuvant systemic chemotherapy with carboplatin for AUC of 6 and paclitaxel 200 MG/M2 every 3 weeks, status post 3 cycles  with partial response. 2) Video bronchoscopy, right video-assisted thoracoscopy, minithoracotomy, right upper lobectomy with lymph node dissection on 01/18/2016. The final pathology revealed residual squamous cell carcinoma measuring 3.1 cm with close resection margin and metastatic carcinoma to lymph nodes at levels 10R, 12R and 4R. (ypT2a, yN2).  3) Concurrent chemoradiation with weekly carboplatin for AUC of 2 and paclitaxel 45 MG/M2. First dose 02/27/2016. Status post 5 cycles. Last dose was given 03/26/2016.  CURRENT THERAPY: Observation. Past Medical History  Diagnosis Date  . COPD (chronic obstructive pulmonary disease) ([place])   . Hypertension   . Depression   . Asthma   . Glaucoma   . Hyperlipidemia   . Heart murmur   . Non-small cell carcinoma of right lung, stage 2 ([place]) 10/03/2015  . Chemotherapy-induced neuropathy ([place]) 11/01/2015  . History of nuclear stress test     Myoview 2/17: no ischemia or scar, EF 79%; low risk  . History of echocardiogram     Echo 2/17: EF 75-17%, grade 1 diastolic dysfunction, mild MR, trivial pericardial effusion  . Renal cell carcinoma ([place])     L nephrectomy  in 2012  . Anxiety   . Claustrophobia   . Headache     prior to menopause  . Anemia     was while doing chemo  . Encounter for antineoplastic chemotherapy 02/09/2016  . Insomnia 05/16/2016  . Radiation 02/29/16-04/10/16    50.4 Gy to right central chest     History  Smoking status  . Former Smoker -- 0.50 packs/day  for 50 years  . Types: Cigarettes  . Quit date: 03/16/2011  Smokeless tobacco  . Never Used    History  Alcohol Use No     Allergies  Allergen Reactions  . Bee Venom Anaphylaxis, Shortness Of Breath, Swelling and Other (See Comments)    Swelling at site   . Amlodipine Swelling and Other (See Comments)    Swelling of the ankles and hands   . Levofloxacin Other (See Comments)    Joint pain   . Hctz [Hydrochlorothiazide] Palpitations and Other (See Comments)     Sweating     Current Outpatient Prescriptions  Medication Sig Dispense Refill  . acetaminophen (TYLENOL) 500 MG tablet Take 1,000 mg by mouth every 6 (six) hours as needed for mild pain, moderate pain, fever or headache. Reported on 05/24/2016    . albuterol (PROVENTIL HFA;VENTOLIN HFA) 108 (90 BASE) MCG/ACT inhaler Inhale 2 puffs into the lungs every 4 (four) hours as needed for wheezing or shortness of breath. 1 Inhaler 0  . aspirin EC 81 MG tablet Take 81 mg by mouth daily. Reported on 04/11/2016    . atorvastatin (LIPITOR) 40 MG tablet TAKE ONE TABLET BY MOUTH ONCE DAILY. 90 tablet 3  . calcium carbonate (OSCAL) 1500 (600 Ca) MG TABS tablet Take by mouth 2 (two) times daily with a meal.    . fish oil-omega-3 fatty acids 1000 MG capsule Take 1 capsule by mouth daily.     Marland Kitchen LORazepam (ATIVAN) 0.5 MG tablet Take 1 tablet (0.5 mg total) by mouth 2 (two) times daily as needed for anxiety. 10 tablet 1  . polyvinyl alcohol-povidone (REFRESH) 1.4-0.6 % ophthalmic solution Place 2 drops into both eyes daily as needed (for dry eyes). Reported on 05/24/2016    . pseudoephedrine-guaifenesin (MUCINEX D) 60-600 MG 12 hr tablet Take 1 tablet by mouth every 12 (twelve) hours. Reported on 05/24/2016    . temazepam (RESTORIL) 15 MG capsule Take 1 capsule (15 mg total) by mouth at bedtime as needed for sleep. 30 capsule 0  . traMADol (ULTRAM) 50 MG tablet Take 50 mg by mouth every 6 (six) hours as needed. Reported on 05/24/2016    . UNABLE TO FIND Med Name: Wig 1 each 0  . valsartan (DIOVAN) 80 MG tablet Take 1 tablet (80 mg total) by mouth daily. 90 tablet 0   No current facility-administered medications for this visit.       Physical Exam: BP 106/66 mmHg  Pulse 92  Resp 16  Ht '5\' 4"'$  (1.626 m)  Wt 172 lb (78.019 kg)  BMI 29.51 kg/m2  SpO2 95%  General appearance: alert and cooperative Neurologic: intact Heart: regular rate and rhythm, S1, S2 normal, no murmur, click, rub or gallop Lungs: clear to  auscultation bilaterally Abdomen: soft, non-tender; bowel sounds normal; no masses,  no organomegaly Extremities: extremities normal, atraumatic, no cyanosis or edema Wound: Right chest incision and port sites are Well healed.   Diagnostic Studies & Laboratory data:     Recent Radiology Findings:  Ct Chest W Contrast  05/11/2016  CLINICAL DATA:  Restaging RIGHT lung cancer diagnosed 2016 EXAM: CT CHEST WITH CONTRAST TECHNIQUE: Multidetector CT imaging of the chest was performed during intravenous contrast administration. CONTRAST:  79m ISOVUE-300 IOPAMIDOL (ISOVUE-300) INJECTION 61% COMPARISON:  Chest CT 12/13/2015, PET-CT 09/29/2015 the FINDINGS: Cardiovascular: Intimal calcification of the thoracic aorta. Coronary calcification. No pericardial fluid. Mediastinum/Nodes: No axillary supraclavicular adenopathy. Mediastinal hilar adenopathy. No pericardial fluid. Esophagus normal. Lungs/Pleura: No evidence of  recurrent lung cancer with RIGHT lung. No nodularity in the RIGHT hilar region at site of prior tumor. There is a branching nodular pattern in the RIGHT lower lobe (image 45 series 4). This peripheral branching nodular pattern is new from comparison exam. In the LEFT lower lobe there is a new round nodule measuring 4 mm (image 49, series 4) with a central low-attenuation. Initial lung cancer RIGHT lung had a cavitary component therefore this is a concerning finding. Upper Abdomen: Adrenal gland not completely imaged. No significant change. Musculoskeletal: No aggressive osseous lesion. IMPRESSION: 1. New small nodule in the LEFT lower lobe with central low-attenuation is concerning for metastatic pulmonary nodule. Recommend short interval CT follow-up. 2. New branching nodular pattern in the RIGHT lower lobe is favored post infectious or inflammatory process. 3. No evidence of local recurrence of lung carcinoma at the RIGHT hilum. Electronically Signed   By: Suzy Bouchard M.D.   On: 05/11/2016 10:21     Recent Lab Findings: Lab Results  Component Value Date   WBC 6.6 05/11/2016   HGB 11.8 05/11/2016   HCT 36.6 05/11/2016   PLT 320 05/11/2016   GLUCOSE 109 05/11/2016   CHOL 174 12/10/2015   TRIG 150* 12/10/2015   HDL 89 12/10/2015   LDLCALC 55 12/10/2015   ALT 16 05/11/2016   AST 17 05/11/2016   NA 139 05/11/2016   K 4.7 05/11/2016   CL 98* 01/23/2016   CREATININE 1.1 05/11/2016   BUN 17.5 05/11/2016   CO2 25 05/11/2016   TSH 1.583 12/14/2011   INR 1.00 01/16/2016      Assessment / Plan:     Patient status post preoperative chemotherapy, right upper lobectomy and node dissection and subsequent postop chemotherapy. The patient appears to be doing well symptomatically, she is return to work. She continues to have some fatigue but this is improving I reviewed with her the CT scan results and question of new left lower lobe nodule. She has a follow-up appointment with Dr. Earlie Server and follow-up scan in October. She prefers to decrease the number of her appointments and will see me only as needed or requested by her or Dr. Julien Nordmann.  [name] MD     [place] [address] [address] [phone]  05/24/2016 4:13 PM

## 2016-05-29 ENCOUNTER — Ambulatory Visit: Payer: Medicare Other | Admitting: Internal Medicine

## 2016-05-31 ENCOUNTER — Encounter: Payer: Medicare Other | Admitting: Cardiothoracic Surgery

## 2016-06-14 ENCOUNTER — Ambulatory Visit (INDEPENDENT_AMBULATORY_CARE_PROVIDER_SITE_OTHER): Payer: Medicare Other | Admitting: Physician Assistant

## 2016-06-14 VITALS — BP 160/88 | HR 99 | Temp 98.4°F | Resp 16 | Ht 63.0 in | Wt 172.2 lb

## 2016-06-14 DIAGNOSIS — R05 Cough: Secondary | ICD-10-CM

## 2016-06-14 DIAGNOSIS — R030 Elevated blood-pressure reading, without diagnosis of hypertension: Secondary | ICD-10-CM | POA: Diagnosis not present

## 2016-06-14 DIAGNOSIS — IMO0001 Reserved for inherently not codable concepts without codable children: Secondary | ICD-10-CM

## 2016-06-14 DIAGNOSIS — R059 Cough, unspecified: Secondary | ICD-10-CM

## 2016-06-14 MED ORDER — AMOXICILLIN-POT CLAVULANATE 875-125 MG PO TABS
1.0000 | ORAL_TABLET | Freq: Two times a day (BID) | ORAL | 0 refills | Status: DC
Start: 2016-06-14 — End: 2016-07-04

## 2016-06-14 NOTE — Progress Notes (Signed)
Tammy Ball  MRN: [id number] DOB: 06-10-1941  Subjective:  Tammy Ball is a 75 y.o. female seen in office today for a chief complaint of productive cough (yellow sputum production) x 1 day. It started yesterday and has progressed throughout the day today. Has associated wheezing and subjective fever (100.1, took tylenol), pain in right side, and congestion. Pt also notes SOB but states that this is her baseline. Denies chest pain and palpitations. Has tried tessalon perles and mucinex-d with minimal relief.   Of note, pt is a former smoker, has minimal COPD and a history of recurring pneumonia (last treatment was May 2017 for similar presentation). Pt also has a history of right lung cancer. Had thoracoscopy/wedge resection of right upper lobe in April 2017 for lung cancer. Finished chemo and radiation at the end of May 2017. Is followed closely by oncologist every three months. Last visit with oncologist was July 2017.    Review of Systems  Constitutional: Positive for fatigue. Negative for appetite change.  HENT: Positive for sinus pressure (for the past week) and sore throat. Negative for sneezing.   Gastrointestinal: Negative for abdominal pain, diarrhea, nausea and vomiting.  Allergic/Immunologic: Positive for environmental allergies (usually start this time of the year).    Patient Active Problem List   Diagnosis Date Noted  . Insomnia 05/16/2016  . Chemotherapy induced neutropenia ([place]) 04/12/2016  . Bronchitis 04/11/2016  . S/P lobectomy of lung 01/18/2016  . Chemotherapy-induced neuropathy ([place]) 11/01/2015  . Non-small cell carcinoma of right lung, stage 2 ([place]) 10/03/2015  . Morbid obesity ([place]) 10/01/2015  . Hyperlipidemia 08/14/2013  . Spondylolisthesis of lumbar region 07/03/2013  . Glaucoma 10/23/2012  . Essential hypertension 12/17/2011  . COPD GOLD II  12/17/2011  . Hearing loss 12/17/2011  . Renal cell cancer ([place]) 12/17/2011  . Sciatica of left side  12/17/2011  . Depression 12/17/2011    Current Outpatient Prescriptions on File Prior to Visit  Medication Sig Dispense Refill  . acetaminophen (TYLENOL) 500 MG tablet Take 1,000 mg by mouth every 6 (six) hours as needed for mild pain, moderate pain, fever or headache. Reported on 05/24/2016    . albuterol (PROVENTIL HFA;VENTOLIN HFA) 108 (90 BASE) MCG/ACT inhaler Inhale 2 puffs into the lungs every 4 (four) hours as needed for wheezing or shortness of breath. 1 Inhaler 0  . aspirin EC 81 MG tablet Take 81 mg by mouth daily. Reported on 04/11/2016    . atorvastatin (LIPITOR) 40 MG tablet TAKE ONE TABLET BY MOUTH ONCE DAILY. 90 tablet 3  . calcium carbonate (OSCAL) 1500 (600 Ca) MG TABS tablet Take by mouth 2 (two) times daily with a meal.    . fish oil-omega-3 fatty acids 1000 MG capsule Take 1 capsule by mouth daily.     Marland Kitchen LORazepam (ATIVAN) 0.5 MG tablet Take 1 tablet (0.5 mg total) by mouth 2 (two) times daily as needed for anxiety. 10 tablet 1  . polyvinyl alcohol-povidone (REFRESH) 1.4-0.6 % ophthalmic solution Place 2 drops into both eyes daily as needed (for dry eyes). Reported on 05/24/2016    . pseudoephedrine-guaifenesin (MUCINEX D) 60-600 MG 12 hr tablet Take 1 tablet by mouth every 12 (twelve) hours. Reported on 05/24/2016    . temazepam (RESTORIL) 15 MG capsule Take 1 capsule (15 mg total) by mouth at bedtime as needed for sleep. 30 capsule 0  . traMADol (ULTRAM) 50 MG tablet Take 50 mg by mouth every 6 (six) hours as needed. Reported on 05/24/2016    .  UNABLE TO FIND Med Name: Wig 1 each 0  . valsartan (DIOVAN) 80 MG tablet Take 1 tablet (80 mg total) by mouth daily. 90 tablet 0   No current facility-administered medications on file prior to visit.     Allergies  Allergen Reactions  . Bee Venom Anaphylaxis, Shortness Of Breath, Swelling and Other (See Comments)    Swelling at site   . Amlodipine Swelling and Other (See Comments)    Swelling of the ankles and hands   .  Levofloxacin Other (See Comments)    Joint pain   . Hctz [Hydrochlorothiazide] Palpitations and Other (See Comments)    Sweating     Objective:  BP (!) 160/88 (BP Location: Right Arm, Patient Position: Sitting, Cuff Size: Normal)   Pulse 99   Temp 98.4 F (36.9 C) (Oral)   Ht '5\' 3"'$  (1.6 m)   Wt 172 lb 3.2 oz (78.1 kg)   BMI 30.50 kg/m   Physical Exam  Constitutional: She is oriented to person, place, and time and well-developed, well-nourished, and in no distress.  HENT:  Head: Normocephalic and atraumatic.  Nose: Nose normal.  Mouth/Throat: Uvula is midline, oropharynx is clear and moist and mucous membranes are normal.  Eyes: Conjunctivae are normal.  Neck: Normal range of motion.  Cardiovascular: Normal rate, regular rhythm and normal heart sounds.   Pulmonary/Chest: Effort normal. No respiratory distress. She has wheezes (minimal ausculated over right lung field).  Course lung sounds bilateral lung fields   Neurological: She is alert and oriented to person, place, and time. Gait normal.  Skin: Skin is warm and dry.  Psychiatric: Affect normal.  Vitals reviewed.   Assessment and Plan :   1. Cough  -Due to patient's history of COPD and recent lung resection, will treat empirically for pneumonia though active pneumonia is unlikely at this time.  - amoxicillin-clavulanate (AUGMENTIN) 875-125 MG tablet; Take 1 tablet by mouth 2 (two) times daily.  Dispense: 20 tablet; Refill: 0 -Continue doing nebulizer twice a day at home over the next 2-3 days. -Drink lots of fluids. -If symptoms worsen or do not improve come back..  2. Elevated blood pressure -Patient is typically well controlled but has been taking Mucinex-D the past couple days. Was informed to avoid medications with pseudoephedrine in them while bp is elevated.   Tenna Delaine PA-C  [facility] 06/14/2016 8:53 AM

## 2016-06-14 NOTE — Patient Instructions (Addendum)
Take antibiotic as prescribed. Continue doing nebulizer twice a day at home over the next 2-3 days. Drink lots of fluids. Avoid pseudoephedrine in medications as your bp is elevated today. If symptoms worsen or do not improve come back..     IF you received an x-ray today, you will receive an invoice from [facility]. Please contact [facility] at [phone] with questions or concerns regarding your invoice.   IF you received labwork today, you will receive an invoice from [employer]. Please contact [facility] at [phone] with questions or concerns regarding your invoice.   Our billing staff will not be able to assist you with questions regarding bills from these companies.  You will be contacted with the lab results as soon as they are available. The fastest way to get your results is to activate your My Chart account. Instructions are located on the last page of this paperwork. If you have not heard from [place] regarding the results in 2 weeks, please contact this office.

## 2016-06-21 ENCOUNTER — Telehealth: Payer: Self-pay | Admitting: *Deleted

## 2016-06-21 ENCOUNTER — Telehealth: Payer: Self-pay | Admitting: Internal Medicine

## 2016-06-21 DIAGNOSIS — C3491 Malignant neoplasm of unspecified part of right bronchus or lung: Secondary | ICD-10-CM

## 2016-06-21 NOTE — Telephone Encounter (Signed)
Patient at cancer center as walk-in d/t fever of 101. [facility] is unavailable for today. Met with patient. Falisa states she has fever of 101 last and this am. She had developed fever and cough last week and was seen at an urgent care center.  She has been on Augmentin since 06/14/16. She states her cough is not better, still has yellow secretions and wheezing.  Dua states she just does not feel well.   Spoke with Dr. Julien Nordmann and he recommends pt seeing her PCP as she is now under observation for her lung cancer. She has had no active chemotherapy since May 2017.  Spoke with patient. She currently does not have a PCP. Suggested to patient that we could see her in [facility] tomorrow morning. She is agreeable to that. Advised patient to go to ED if she feels worse, increased fever, increased SOB above baseline.  Also advised to patient to work on getting new PCP-he last PCP retired while she was receiving Chemo. Pt agreeable to that as well.   Lab, CXR and appt for [facility] placed for tomorrow 06/22/16

## 2016-06-21 NOTE — Telephone Encounter (Signed)
Spoke with pt to confirm [facility] appt 8/11 per order

## 2016-06-22 ENCOUNTER — Ambulatory Visit: Payer: Medicare Other

## 2016-06-22 ENCOUNTER — Other Ambulatory Visit (HOSPITAL_BASED_OUTPATIENT_CLINIC_OR_DEPARTMENT_OTHER): Payer: Medicare Other

## 2016-06-22 ENCOUNTER — Ambulatory Visit (HOSPITAL_COMMUNITY)
Admission: RE | Admit: 2016-06-22 | Discharge: 2016-06-22 | Disposition: A | Discharge status: Home / Self Care | Payer: Medicare Other | Source: Ambulatory Visit | Attending: Nurse Practitioner | Admitting: Nurse Practitioner

## 2016-06-22 ENCOUNTER — Encounter: Payer: Self-pay | Admitting: Family Medicine

## 2016-06-22 ENCOUNTER — Encounter (HOSPITAL_COMMUNITY): Payer: Self-pay

## 2016-06-22 ENCOUNTER — Ambulatory Visit (HOSPITAL_BASED_OUTPATIENT_CLINIC_OR_DEPARTMENT_OTHER): Payer: Medicare Other | Admitting: Nurse Practitioner

## 2016-06-22 VITALS — BP 110/58 | HR 106 | Temp 98.6°F | Resp 17 | Ht 63.0 in | Wt 168.5 lb

## 2016-06-22 DIAGNOSIS — R05 Cough: Secondary | ICD-10-CM | POA: Diagnosis not present

## 2016-06-22 DIAGNOSIS — C3491 Malignant neoplasm of unspecified part of right bronchus or lung: Secondary | ICD-10-CM

## 2016-06-22 DIAGNOSIS — Z923 Personal history of irradiation: Secondary | ICD-10-CM | POA: Diagnosis not present

## 2016-06-22 DIAGNOSIS — C3411 Malignant neoplasm of upper lobe, right bronchus or lung: Secondary | ICD-10-CM

## 2016-06-22 DIAGNOSIS — Z9889 Other specified postprocedural states: Secondary | ICD-10-CM | POA: Diagnosis not present

## 2016-06-22 DIAGNOSIS — J40 Bronchitis, not specified as acute or chronic: Secondary | ICD-10-CM

## 2016-06-22 DIAGNOSIS — E860 Dehydration: Secondary | ICD-10-CM

## 2016-06-22 DIAGNOSIS — R062 Wheezing: Secondary | ICD-10-CM | POA: Diagnosis not present

## 2016-06-22 LAB — COMPREHENSIVE METABOLIC PANEL
ALT: 9 U/L (ref 0–55)
AST: 13 U/L (ref 5–34)
Albumin: 3.5 g/dL (ref 3.5–5.0)
Alkaline Phosphatase: 111 U/L (ref 40–150)
Anion Gap: 13 mEq/L — ABNORMAL HIGH (ref 3–11)
BUN: 29.9 mg/dL — ABNORMAL HIGH (ref 7.0–26.0)
CO2: 24 mEq/L (ref 22–29)
Calcium: 10.5 mg/dL — ABNORMAL HIGH (ref 8.4–10.4)
Chloride: 99 mEq/L (ref 98–109)
Creatinine: 1.4 mg/dL — ABNORMAL HIGH (ref 0.6–1.1)
EGFR: 38 mL/min/{1.73_m2} — ABNORMAL LOW (ref >90)
Glucose: 106 mg/dl (ref 70–140)
Potassium: 5 mEq/L (ref 3.5–5.1)
Sodium: 135 mEq/L — ABNORMAL LOW (ref 136–145)
Total Bilirubin: 0.51 mg/dL (ref 0.20–1.20)
Total Protein: 8.1 g/dL (ref 6.4–8.3)

## 2016-06-22 LAB — CBC WITH DIFFERENTIAL/PLATELET
BASO%: 0.4 % (ref 0.0–2.0)
Basophils Absolute: 0 10*3/uL (ref 0.0–0.1)
EOS%: 2.4 % (ref 0.0–7.0)
Eosinophils Absolute: 0.2 10*3/uL (ref 0.0–0.5)
HCT: 35.1 % (ref 34.8–46.6)
HGB: 11.4 g/dL — ABNORMAL LOW (ref 11.6–15.9)
LYMPH%: 20.8 % (ref 14.0–49.7)
MCH: 30.4 pg (ref 25.1–34.0)
MCHC: 32.5 g/dL (ref 31.5–36.0)
MCV: 93.6 fL (ref 79.5–101.0)
MONO#: 0.7 10*3/uL (ref 0.1–0.9)
MONO%: 9.3 % (ref 0.0–14.0)
NEUT#: 4.7 10*3/uL (ref 1.5–6.5)
NEUT%: 67.1 % (ref 38.4–76.8)
Platelets: 316 10*3/uL (ref 145–400)
RBC: 3.75 10*6/uL (ref 3.70–5.45)
RDW: 16.3 % — ABNORMAL HIGH (ref 11.2–14.5)
WBC: 7 10*3/uL (ref 3.9–10.3)
lymph#: 1.5 10*3/uL (ref 0.9–3.3)

## 2016-06-22 MED ORDER — SODIUM CHLORIDE 0.9 % IV SOLN: Freq: Once | INTRAVENOUS | Status: DC

## 2016-06-22 MED ORDER — IOPAMIDOL (ISOVUE-370) INJECTION 76%
100.0000 mL | Freq: Once | INTRAVENOUS | Status: AC | PRN
  Administered 2016-06-22: 80 mL via INTRAVENOUS

## 2016-06-22 NOTE — Patient Instructions (Signed)

## 2016-06-23 ENCOUNTER — Encounter: Payer: Self-pay | Admitting: Family Medicine

## 2016-06-23 ENCOUNTER — Encounter: Payer: Self-pay | Admitting: Nurse Practitioner

## 2016-06-25 ENCOUNTER — Ambulatory Visit (HOSPITAL_BASED_OUTPATIENT_CLINIC_OR_DEPARTMENT_OTHER): Payer: Medicare Other | Admitting: Nurse Practitioner

## 2016-06-25 ENCOUNTER — Encounter: Payer: Self-pay | Admitting: Nurse Practitioner

## 2016-06-25 VITALS — BP 151/92 | HR 118 | Temp 99.6°F | Resp 20

## 2016-06-25 DIAGNOSIS — E860 Dehydration: Secondary | ICD-10-CM | POA: Insufficient documentation

## 2016-06-25 DIAGNOSIS — J40 Bronchitis, not specified as acute or chronic: Secondary | ICD-10-CM

## 2016-06-25 DIAGNOSIS — C3491 Malignant neoplasm of unspecified part of right bronchus or lung: Secondary | ICD-10-CM

## 2016-06-25 MED ORDER — METHYLPREDNISOLONE 4 MG PO TBPK
ORAL_TABLET | ORAL | 0 refills | Status: DC
Start: 2016-06-25 — End: 2016-08-21

## 2016-06-25 MED ORDER — HYDROCODONE-HOMATROPINE 5-1.5 MG/5ML PO SYRP: 5.0000 mL | ORAL_SOLUTION | Freq: Four times a day (QID) | ORAL | 0 refills | Status: DC | PRN

## 2016-06-25 NOTE — Progress Notes (Signed)
Reviewed Medrol dosepak and Hycodan cough syrup with patient. No questions.   SYMPTOM MANAGEMENT CLINIC    Chief Complaint: Cough  HPI:  Tammy Ball 75 y.o. female diagnosed with lung cancer.  Patient is status post chemotherapy completed 03/26/2016 and radiation treatments which were just completed yesterday, 04/10/2016.  Currently undergoing observation only.  Patient reports some mild URI symptoms; and a very congested, productive cough.  She states she's had a fever last night to maximum of 99.6.  Exam today reveals some mild rhonchi to the basis bilaterally; and some mild queasiness well.  Patient does have a congested cough; but no acute respiratory distress.  Chest x-ray obtained today reveal no acute findings.  Will prescribe patient a Z-Pak for treatment of bronchitis.  Also, patient states she R he has a nebulizer at home with albuterol solution that she continues on an as-needed basis.  She also has an albuterol inhaler to take with her when she travels.  Patient was advised to complete all antibiotics as directed.  She may use the albuterol nebulizer at home every 6 hours on an as-needed basis.  She was encouraged to call/return if her symptoms persist.  Also, patient was advised to go directly to the emergency department for any worsening symptoms whatsoever. Oncology History   Patient followed due to hx NSCLC stage IIA squamous cell dx 09/2015 s/p neoadjuvant chemotherapy   Non-small cell carcinoma of right lung, stage 2 ([place])   Staging form: Lung, AJCC 7th Edition     Clinical stage from 10/03/2015: Stage IIA (T2a, N1, M0) - Signed by Curt Bears, MD on 10/03/2015 Renal cell cancer [facility])   Staging form: Kidney, AJCC 7th Edition     Clinical: No stage assigned - Unsigned       Non-small cell carcinoma of right lung, stage 2 ([place])   09/19/2015 Imaging    CTA Irregular opacity in the medial right upper lobe extending to and possibly arising from the right hilum.       09/20/2015 Surgery    VIDEO BRONCHOSCOPY WITHOUT FLUORO     09/29/2015 Imaging    PET IMPRESSION: 4 cm hypermetabolic central right upper lobe mass, consistent with primary bronchogenic carcinoma. This is contiguous with the right hilum.     10/03/2015 Initial Diagnosis    Non-small cell carcinoma of right lung, stage 2 ([place])     10/11/2015 - 11/22/2015 Chemotherapy    taxol/carbo     10/21/2015 Imaging    MRI Brain No pathologic enhancement to suggest metastatic disease of the brain or meninges.     11/19/2015 Imaging    CXR IMPRESSION: There is airspace consolidation in the right upper lobe medially     12/13/2015 Imaging    CT Chest IMPRESSION: Marked response to therapy. Significant decrease in size of a central right upper lobe pulmonary nodule with increase in cavitation.     01/18/2016 Surgery    PROCEDURES: Video bronchoscopy, right video-assisted thoracoscopy, mini thoracotomy, right upper lobectomy with lymph node dissection and placement of On-Q device.     02/21/2016 -  Radiation Therapy    Adjuvant tx [facility]     02/27/2016 -  Chemotherapy    1st adjuvant chemotherapy Carbo/Taxol      Review of Systems  Constitutional: Positive for fever, chills and malaise/fatigue.  HENT: Positive for congestion.   Respiratory: Positive for cough, sputum production and wheezing.   All other systems reviewed and are negative.   Past Medical History:  Diagnosis Date  .  Anemia    was while doing chemo  . Anxiety   . Asthma   . Chemotherapy-induced neuropathy ([place]) 11/01/2015  . Claustrophobia   . COPD (chronic obstructive pulmonary disease) ([place])   . Depression   . Encounter for antineoplastic chemotherapy 02/09/2016  . Glaucoma   . Headache    prior to menopause  . Heart murmur   . History of echocardiogram    Echo 2/17: EF 84-53%, grade 1 diastolic dysfunction, mild MR, trivial pericardial effusion  . History of nuclear stress test    Myoview 2/17: no ischemia or scar,  EF 79%; low risk  . Hyperlipidemia   . Hypertension   . Insomnia 05/16/2016  . Non-small cell carcinoma of right lung, stage 2 ([place]) 10/03/2015  . Radiation 02/29/16-04/10/16   50.4 Gy to right central chest  . Renal cell carcinoma ([place])    L nephrectomy  in 2012    Past Surgical History:  Procedure Laterality Date  . BACK SURGERY     cervical 1991  . EYE SURGERY    . kidney cancer    . NEPHRECTOMY    . SPINE SURGERY    . TUBAL LIGATION    . VIDEO ASSISTED THORACOSCOPY (VATS)/WEDGE RESECTION Right 01/18/2016   Procedure: VIDEO ASSISTED THORACOSCOPY (VATS)/LUNG RESECTION, THOROCOTOMY, RIGHT UPPER LOBECTOMY, LYMPH NODE DISSECTION, PLACEMENT OF ON Q;  Surgeon: [name], MD;  Location: [facility];  Service: Thoracic;  Laterality: Right;  Marland Kitchen VIDEO BRONCHOSCOPY Bilateral 09/20/2015   Procedure: VIDEO BRONCHOSCOPY WITHOUT FLUORO;  Surgeon: Rigoberto Noel, MD;  Location: [facility];  Service: Cardiopulmonary;  Laterality: Bilateral;  . VIDEO BRONCHOSCOPY N/A 01/18/2016   Procedure: VIDEO BRONCHOSCOPY;  Surgeon: [name], MD;  Location: [facility];  Service: Thoracic;  Laterality: N/A;    has Essential hypertension; COPD GOLD II ; Hearing loss; Renal cell cancer ([place]); Sciatica of left side; Depression; Glaucoma; Spondylolisthesis of lumbar region; Hyperlipidemia; Morbid obesity ([place]); Non-small cell carcinoma of right lung, stage 2 ([place]); Chemotherapy-induced neuropathy ([place]); S/P lobectomy of lung; Bronchitis; Insomnia; Dehydration; and Hypercalcemia on her problem list.    is allergic to bee venom; amlodipine; levofloxacin; and hctz [hydrochlorothiazide].    Medication List       Accurate as of 06/25/16 11:59 PM. Always use your most recent med list.          acetaminophen 500 MG tablet Commonly known as:  TYLENOL Take 1,000 mg by mouth every 6 (six) hours as needed for mild pain, moderate pain, fever or headache. Reported on 05/24/2016   albuterol 108 (90 Base) MCG/ACT  inhaler Commonly known as:  PROVENTIL HFA;VENTOLIN HFA Inhale 2 puffs into the lungs every 4 (four) hours as needed for wheezing or shortness of breath.   amoxicillin-clavulanate 875-125 MG tablet Commonly known as:  AUGMENTIN Take 1 tablet by mouth 2 (two) times daily.   aspirin EC 81 MG tablet Take 81 mg by mouth daily. Reported on 04/11/2016   atorvastatin 40 MG tablet Commonly known as:  LIPITOR TAKE ONE TABLET BY MOUTH ONCE DAILY.   calcium carbonate 1500 (600 Ca) MG Tabs tablet Commonly known as:  OSCAL Take by mouth 2 (two) times daily with a meal.   fish oil-omega-3 fatty acids 1000 MG capsule Take 1 capsule by mouth daily.   fluticasone 50 MCG/ACT nasal spray Commonly known as:  FLONASE Place 2 sprays into both nostrils daily.   HYDROcodone-homatropine 5-1.5 MG/5ML syrup Commonly known as:  HYCODAN Take 5 mLs by mouth  every 6 (six) hours as needed for cough.   LORazepam 0.5 MG tablet Commonly known as:  ATIVAN Take 1 tablet (0.5 mg total) by mouth 2 (two) times daily as needed for anxiety.   methylPREDNISolone 4 MG Tbpk tablet Commonly known as:  MEDROL DOSEPAK Medrol dose pak: take as directed.   pseudoephedrine-guaifenesin 60-600 MG 12 hr tablet Commonly known as:  MUCINEX D Take 1 tablet by mouth every 12 (twelve) hours. Reported on 05/24/2016   REFRESH 1.4-0.6 % ophthalmic solution Generic drug:  polyvinyl alcohol-povidone Place 2 drops into both eyes daily as needed (for dry eyes). Reported on 05/24/2016   temazepam 15 MG capsule Commonly known as:  RESTORIL Take 1 capsule (15 mg total) by mouth at bedtime as needed for sleep.   traMADol 50 MG tablet Commonly known as:  ULTRAM Take 50 mg by mouth every 6 (six) hours as needed. Reported on 05/24/2016   UNABLE TO FIND Med Name: Wig   valsartan 80 MG tablet Commonly known as:  DIOVAN Take 1 tablet (80 mg total) by mouth daily.        PHYSICAL EXAMINATION  Oncology Vitals 06/25/2016 06/22/2016   Height - 160 cm  Weight - 76.431 kg  Weight (lbs) - 168 lbs 8 oz  BMI (kg/m2) - 29.85 kg/m2  Temp 99.6 98.6  Pulse 118 106  Resp 20 17  SpO2 92 97  BSA (m2) - 1.84 m2   BP Readings from Last 2 Encounters:  06/25/16 (!) 151/92  06/22/16 (!) 110/58    Physical Exam  Constitutional: She is oriented to person, place, and time and well-developed, well-nourished, and in no distress.  HENT:  Head: Normocephalic and atraumatic.  Mouth/Throat: Oropharynx is clear and moist.  Mild nasal congestion only.  No facial tenderness with palpation.  Eyes: Conjunctivae and EOM are normal. Pupils are equal, round, and reactive to light. Right eye exhibits no discharge. Left eye exhibits no discharge. No scleral icterus.  Neck: Normal range of motion. Neck supple. No JVD present. No tracheal deviation present. No thyromegaly present.  Cardiovascular: Normal rate, regular rhythm, normal heart sounds and intact distal pulses.   Pulmonary/Chest: Effort normal. No respiratory distress. She has wheezes. She has rales. She exhibits no tenderness.  Abdominal: Soft. Bowel sounds are normal. She exhibits no distension and no mass. There is no tenderness. There is no rebound and no guarding.  Musculoskeletal: Normal range of motion. She exhibits no edema or tenderness.  Lymphadenopathy:    She has no cervical adenopathy.  Neurological: She is alert and oriented to person, place, and time. Gait normal.  Skin: Skin is warm and dry. No rash noted. No erythema. No pallor.  Psychiatric: Affect normal.    LABORATORY DATA:. No visits with results within 3 Day(s) from this visit.  Latest known visit with results is:  Appointment on 06/22/2016  Component Date Value Ref Range Status  . WBC 06/22/2016 7.0  3.9 - 10.3 10e3/uL Final  . NEUT# 06/22/2016 4.7  1.5 - 6.5 10e3/uL Final  . HGB 06/22/2016 11.4* 11.6 - 15.9 g/dL Final  . HCT 06/22/2016 35.1  34.8 - 46.6 % Final  . Platelets 06/22/2016 316  145 - 400  10e3/uL Final  . MCV 06/22/2016 93.6  79.5 - 101.0 fL Final  . MCH 06/22/2016 30.4  25.1 - 34.0 pg Final  . MCHC 06/22/2016 32.5  31.5 - 36.0 g/dL Final  . RBC 06/22/2016 3.75  3.70 - 5.45 10e6/uL Final  . RDW  06/22/2016 16.3* 11.2 - 14.5 % Final  . lymph# 06/22/2016 1.5  0.9 - 3.3 10e3/uL Final  . MONO# 06/22/2016 0.7  0.1 - 0.9 10e3/uL Final  . Eosinophils Absolute 06/22/2016 0.2  0.0 - 0.5 10e3/uL Final  . Basophils Absolute 06/22/2016 0.0  0.0 - 0.1 10e3/uL Final  . NEUT% 06/22/2016 67.1  38.4 - 76.8 % Final  . LYMPH% 06/22/2016 20.8  14.0 - 49.7 % Final  . MONO% 06/22/2016 9.3  0.0 - 14.0 % Final  . EOS% 06/22/2016 2.4  0.0 - 7.0 % Final  . BASO% 06/22/2016 0.4  0.0 - 2.0 % Final  . Sodium 06/22/2016 135* 136 - 145 mEq/L Final  . Potassium 06/22/2016 5.0  3.5 - 5.1 mEq/L Final  . Chloride 06/22/2016 99  98 - 109 mEq/L Final  . CO2 06/22/2016 24  22 - 29 mEq/L Final  . Glucose 06/22/2016 106  70 - 140 mg/dl Final  . BUN 06/22/2016 29.9* 7.0 - 26.0 mg/dL Final  . Creatinine 06/22/2016 1.4* 0.6 - 1.1 mg/dL Final  . Total Bilirubin 06/22/2016 0.51  0.20 - 1.20 mg/dL Final  . Alkaline Phosphatase 06/22/2016 111  40 - 150 U/L Final  . AST 06/22/2016 13  5 - 34 U/L Final  . ALT 06/22/2016 9  0 - 55 U/L Final  . Total Protein 06/22/2016 8.1  6.4 - 8.3 g/dL Final  . Albumin 06/22/2016 3.5  3.5 - 5.0 g/dL Final  . Calcium 06/22/2016 10.5* 8.4 - 10.4 mg/dL Final  . Anion Gap 06/22/2016 13* 3 - 11 mEq/L Final  . EGFR 06/22/2016 38* >90 ml/min/1.73 m2 Final    RADIOGRAPHIC STUDIES: Dg Chest 2 View  Result Date: 06/22/2016 CLINICAL DATA:  Fever and cough since Thursday. History of non-small cell lung cancer status post right upper lobe lobectomy and radiation therapy. EXAM: CHEST  2 VIEW COMPARISON:  Chest x-ray 04/11/2016 and chest CT 05/11/2016 FINDINGS: The cardiac silhouette, mediastinal and hilar contours are within normal limits and stable. Stable tortuosity and calcification of the  thoracic aorta. There are new/ progressive radiation changes involving the right paramediastinal lung along with stable surgical changes. No definite infiltrates or effusions. No worrisome pulmonary lesions. The bony thorax is intact. IMPRESSION: New/ progressive radiation changes involving the right paramediastinal lung. No definite acute overlying pulmonary findings such as infiltrate or effusion. Electronically Signed   By: Marijo Sanes M.D.   On: 06/22/2016 11:28   Ct Angio Chest Pe W Or Wo Contrast  Result Date: 06/22/2016 CLINICAL DATA:  Cough and wheezing for 1 week. History of lung cancer and recent radiation therapy. EXAM: CT ANGIOGRAPHY CHEST WITH CONTRAST TECHNIQUE: Multidetector CT imaging of the chest was performed using the standard protocol during bolus administration of intravenous contrast. Multiplanar CT image reconstructions and MIPs were obtained to evaluate the vascular anatomy. CONTRAST:  80 cc Isovue 370 COMPARISON:  Chest CT 05/11/2016 FINDINGS: The chest wall: No breast masses, supraclavicular or axillary lymphadenopathy. The thyroid gland appears normal. Cardiovascular: The heart is normal in size. No pericardial effusion. Mild tortuosity and calcification of the thoracic aorta but no focal aneurysm or dissection. The branch vessels are patent. Coronary artery calcifications are noted. The pulmonary arterial tree is well opacified. No filling defects to suggest pulmonary embolism. Mediastinum/Nodes: No mediastinal or hilar mass or lymphadenopathy. A few small scattered lymph nodes are noted. Surgical changes in the right hilum from recent right upper lobe lobectomy. No findings for recurrent tumor. Lungs/Pleura: Radiation changes involving  the right paramediastinal long. No infiltrates, edema or effusions. Stable emphysematous changes. No worrisome pulmonary lesions. The left lung is clear. No pleural effusions. Upper Abdomen: No significant upper abdominal findings. Musculoskeletal:  No significant bony findings. Review of the MIP images confirms the above findings. IMPRESSION: 1. Surgical and radiation changes. No findings for recurrent tumor or metastatic disease. 2. No CT findings for pulmonary embolism. 3. No thoracic aortic dissection or aneurysm. Electronically Signed   By: Marijo Sanes M.D.   On: 06/22/2016 15:11    ASSESSMENT/PLAN:    Non-small cell carcinoma of right lung, stage 2 ([place]) Patient is status post chemotherapy and radiation completed in May 2017.  She continues with observation only.  Her last note from Dr. Brunilda Payor should be scheduled for labs on 08/14/2016, and a follow-up visit on 08/21/2016.  She will also be due a restaging CT around 08/16/2016 prior to her visit with Dr. Julien Nordmann.  Bronchitis Patient presented to the [facility] today with complaint of continued, congested cough productive of dark yellow secretions.  She has been on Augmentin for approximately 8 days with minimal improvement.  She continues with fevers to a maximum of 101.0 as well.  She states that Tylenol relieves her fever.  Patient also feels increasingly fatigued and weak.  Exam today reveals some rhonchi and wheezing to all lung fields; but worse on the right side.  Vital signs are stable and patient was afebrile with temperature 98.6.  Chest x-ray obtained today revealed: IMPRESSION: New/ progressive radiation changes involving the right paramediastinal lung. No definite acute overlying pulmonary findings such as infiltrate or effusion.  CT angio of the chest revealed:   IMPRESSION: 1. Surgical and radiation changes. No findings for recurrent tumor or metastatic disease. 2. No CT findings for pulmonary embolism. 3. No thoracic aortic dissection or aneurysm.  Reviewed all findings with Dr. Benay Spice on call physician; and he agreed that patient may very well be experiencing some previous radiation-induced pneumonitis.  Patient was advised to finish all of  her Augmentin prescription.  She had a really begun; and will also call in a Medrol dose pack to see if this helps with patient's symptoms.  If patient does respond to the Medrol Dosepak-will need to consider continuing a tapering dose of prednisone.  Patient was advised to call/return of electrical to the emergency department for any worsening symptoms whatsoever. ________________________________________  Update: Patient presented back to the cancer Center this morning for recheck of her symptoms.  Patient states that she continues with a very congested cough and low-grade fevers only.  She confirmed that she does have albuterol nebulizer treatments that she can take at home.  Advised patient to try using the nebulizer treatments on an every 6 hour basis when she is wheezing.  She states that she went to the pharmacy to pick up the [place]; but it was on available.  Apparently the prescription was never electronically prescribed.  Apologized to patient; and confirmed that Medrol Dosepak was electronically prescribed for the patient today.  Also, patient requested a prescription for Hycodan cough syrup as well.  This prescription was printed and given to the patient while the [facility].  Patient was advised to call/return or go directly to the emergency department for any worsening symptoms whatsoever.   Patient stated understanding of all instructions; and was in agreement with this plan of care. The patient knows to call the clinic with any problems, questions or concerns.   Total time spent with patient  was 25 minutes;  with greater than 75 percent of that time spent in face to face counseling regarding patient's symptoms,  and coordination of care and follow up.  Disclaimer:This dictation was prepared with Dragon/digital dictation along with [employer]. Any transcriptional errors that result from this process are unintentional.  Drue Second, NP 06/26/2016

## 2016-06-25 NOTE — Assessment & Plan Note (Signed)
Patient's corrected calcium was elevated to 10.9 today.  Most likely this is secondary to patient's dehydration today.  Patient received IV fluid rehydration while cancer Center today.  We'll continue to monitor closely.

## 2016-06-25 NOTE — Assessment & Plan Note (Signed)
Patient has been treated recently for bronchitis; and may very well be suffering with some pneumonitis.  Patient has been undergoing antibiotics therapy as well; and states that she has had decreased appetite and poor oral intake.  She feels dehydrated today.  She will receive IV fluid rehydration while at the cancer Center today.  She was encouraged to push fluids is much as possible so.

## 2016-06-25 NOTE — Assessment & Plan Note (Signed)
Patient is status post chemotherapy and radiation completed in May 2017.  She continues with observation only.  Her last note from Dr. Brunilda Payor should be scheduled for labs on 08/14/2016, and a follow-up visit on 08/21/2016.  She will also be due a restaging CT around 08/16/2016 prior to her visit with Dr. Julien Nordmann.

## 2016-06-26 ENCOUNTER — Encounter: Payer: Self-pay | Admitting: Nurse Practitioner

## 2016-06-26 NOTE — Progress Notes (Signed)
SYMPTOM MANAGEMENT CLINIC    Chief Complaint: Bronchitis  HPI:  Tammy Ball 75 y.o. female diagnosed with lung cancer.  Patient is status post both chemotherapy and radiation treatments.  Currently undergoing observation only.  Patient presented to the [facility] today with complaint of continued bronchitis symptoms.   Oncology History   Patient followed due to hx NSCLC stage IIA squamous cell dx 09/2015 s/p neoadjuvant chemotherapy   Non-small cell carcinoma of right lung, stage 2 ([place])   Staging form: Lung, AJCC 7th Edition     Clinical stage from 10/03/2015: Stage IIA (T2a, N1, M0) - Signed by Curt Bears, MD on 10/03/2015 Renal cell cancer [facility])   Staging form: Kidney, AJCC 7th Edition     Clinical: No stage assigned - Unsigned       Non-small cell carcinoma of right lung, stage 2 ([place])   09/19/2015 Imaging    CTA Irregular opacity in the medial right upper lobe extending to and possibly arising from the right hilum.      09/20/2015 Surgery    VIDEO BRONCHOSCOPY WITHOUT FLUORO     09/29/2015 Imaging    PET IMPRESSION: 4 cm hypermetabolic central right upper lobe mass, consistent with primary bronchogenic carcinoma. This is contiguous with the right hilum.     10/03/2015 Initial Diagnosis    Non-small cell carcinoma of right lung, stage 2 ([place])     10/11/2015 - 11/22/2015 Chemotherapy    taxol/carbo     10/21/2015 Imaging    MRI Brain No pathologic enhancement to suggest metastatic disease of the brain or meninges.     11/19/2015 Imaging    CXR IMPRESSION: There is airspace consolidation in the right upper lobe medially     12/13/2015 Imaging    CT Chest IMPRESSION: Marked response to therapy. Significant decrease in size of a central right upper lobe pulmonary nodule with increase in cavitation.     01/18/2016 Surgery    PROCEDURES: Video bronchoscopy, right video-assisted thoracoscopy, mini thoracotomy, right upper lobectomy with lymph node dissection and  placement of On-Q device.     02/21/2016 -  Radiation Therapy    Adjuvant tx [facility]     02/27/2016 -  Chemotherapy    1st adjuvant chemotherapy Carbo/Taxol      Review of Systems  Constitutional: Positive for chills, fever and malaise/fatigue.  Respiratory: Positive for cough, sputum production and wheezing.   Neurological: Positive for weakness.  All other systems reviewed and are negative.   Past Medical History:  Diagnosis Date  . Anemia    was while doing chemo  . Anxiety   . Asthma   . Chemotherapy-induced neuropathy ([place]) 11/01/2015  . Claustrophobia   . COPD (chronic obstructive pulmonary disease) ([place])   . Depression   . Encounter for antineoplastic chemotherapy 02/09/2016  . Glaucoma   . Headache    prior to menopause  . Heart murmur   . History of echocardiogram    Echo 2/17: EF 37-16%, grade 1 diastolic dysfunction, mild MR, trivial pericardial effusion  . History of nuclear stress test    Myoview 2/17: no ischemia or scar, EF 79%; low risk  . Hyperlipidemia   . Hypertension   . Insomnia 05/16/2016  . Non-small cell carcinoma of right lung, stage 2 ([place]) 10/03/2015  . Radiation 02/29/16-04/10/16   50.4 Gy to right central chest  . Renal cell carcinoma ([place])    L nephrectomy  in 2012    Past Surgical History:  Procedure Laterality  Date  . BACK SURGERY     cervical 1991  . EYE SURGERY    . kidney cancer    . NEPHRECTOMY    . SPINE SURGERY    . TUBAL LIGATION    . VIDEO ASSISTED THORACOSCOPY (VATS)/WEDGE RESECTION Right 01/18/2016   Procedure: VIDEO ASSISTED THORACOSCOPY (VATS)/LUNG RESECTION, THOROCOTOMY, RIGHT UPPER LOBECTOMY, LYMPH NODE DISSECTION, PLACEMENT OF ON Q;  Surgeon: [name], MD;  Location: [facility];  Service: Thoracic;  Laterality: Right;  Marland Kitchen VIDEO BRONCHOSCOPY Bilateral 09/20/2015   Procedure: VIDEO BRONCHOSCOPY WITHOUT FLUORO;  Surgeon: Rigoberto Noel, MD;  Location: [facility];  Service: Cardiopulmonary;  Laterality: Bilateral;  . VIDEO  BRONCHOSCOPY N/A 01/18/2016   Procedure: VIDEO BRONCHOSCOPY;  Surgeon: [name], MD;  Location: [facility];  Service: Thoracic;  Laterality: N/A;    has Essential hypertension; COPD GOLD II ; Hearing loss; Renal cell cancer ([place]); Sciatica of left side; Depression; Glaucoma; Spondylolisthesis of lumbar region; Hyperlipidemia; Morbid obesity ([place]); Non-small cell carcinoma of right lung, stage 2 ([place]); Chemotherapy-induced neuropathy ([place]); S/P lobectomy of lung; Bronchitis; Insomnia; Dehydration; and Hypercalcemia on her problem list.    is allergic to bee venom; amlodipine; levofloxacin; and hctz [hydrochlorothiazide].    Medication List       Accurate as of 06/22/16 11:59 PM. Always use your most recent med list.          acetaminophen 500 MG tablet Commonly known as:  TYLENOL Take 1,000 mg by mouth every 6 (six) hours as needed for mild pain, moderate pain, fever or headache. Reported on 05/24/2016   albuterol 108 (90 Base) MCG/ACT inhaler Commonly known as:  PROVENTIL HFA;VENTOLIN HFA Inhale 2 puffs into the lungs every 4 (four) hours as needed for wheezing or shortness of breath.   amoxicillin-clavulanate 875-125 MG tablet Commonly known as:  AUGMENTIN Take 1 tablet by mouth 2 (two) times daily.   aspirin EC 81 MG tablet Take 81 mg by mouth daily. Reported on 04/11/2016   atorvastatin 40 MG tablet Commonly known as:  LIPITOR TAKE ONE TABLET BY MOUTH ONCE DAILY.   calcium carbonate 1500 (600 Ca) MG Tabs tablet Commonly known as:  OSCAL Take by mouth 2 (two) times daily with a meal.   fish oil-omega-3 fatty acids 1000 MG capsule Take 1 capsule by mouth daily.   fluticasone 50 MCG/ACT nasal spray Commonly known as:  FLONASE Place 2 sprays into both nostrils daily.   HYDROcodone-homatropine 5-1.5 MG/5ML syrup Commonly known as:  HYCODAN Take 5 mLs by mouth every 6 (six) hours as needed for cough.   LORazepam 0.5 MG tablet Commonly known as:  ATIVAN Take 1 tablet  (0.5 mg total) by mouth 2 (two) times daily as needed for anxiety.   methylPREDNISolone 4 MG Tbpk tablet Commonly known as:  MEDROL DOSEPAK Medrol dose pak: take as directed.   pseudoephedrine-guaifenesin 60-600 MG 12 hr tablet Commonly known as:  MUCINEX D Take 1 tablet by mouth every 12 (twelve) hours. Reported on 05/24/2016   REFRESH 1.4-0.6 % ophthalmic solution Generic drug:  polyvinyl alcohol-povidone Place 2 drops into both eyes daily as needed (for dry eyes). Reported on 05/24/2016   temazepam 15 MG capsule Commonly known as:  RESTORIL Take 1 capsule (15 mg total) by mouth at bedtime as needed for sleep.   traMADol 50 MG tablet Commonly known as:  ULTRAM Take 50 mg by mouth every 6 (six) hours as needed. Reported on 05/24/2016   UNABLE TO FIND Med Name:  Wig   valsartan 80 MG tablet Commonly known as:  DIOVAN Take 1 tablet (80 mg total) by mouth daily.        PHYSICAL EXAMINATION  Oncology Vitals 06/25/2016 06/22/2016  Height - 160 cm  Weight - 76.431 kg  Weight (lbs) - 168 lbs 8 oz  BMI (kg/m2) - 29.85 kg/m2  Temp 99.6 98.6  Pulse 118 106  Resp 20 17  SpO2 92 97  BSA (m2) - 1.84 m2   BP Readings from Last 2 Encounters:  06/25/16 (!) 151/92  06/22/16 (!) 110/58    Physical Exam  Constitutional: She is oriented to person, place, and time. Vital signs are normal. She appears malnourished and dehydrated. She appears unhealthy. She appears cachectic.  HENT:  Head: Normocephalic and atraumatic.  Mouth/Throat: Oropharynx is clear and moist.  Eyes: Conjunctivae and EOM are normal. Pupils are equal, round, and reactive to light. Right eye exhibits no discharge. Left eye exhibits no discharge. No scleral icterus.  Neck: Normal range of motion. Neck supple. No JVD present. No tracheal deviation present. No thyromegaly present.  Cardiovascular: Normal rate, regular rhythm, normal heart sounds and intact distal pulses.   Pulmonary/Chest: Effort normal. No respiratory  distress. She has wheezes. She has rales. She exhibits no tenderness.  Abdominal: Soft. Bowel sounds are normal. She exhibits no distension and no mass. There is no tenderness. There is no rebound and no guarding.  Musculoskeletal: Normal range of motion. She exhibits no edema or tenderness.  Lymphadenopathy:    She has no cervical adenopathy.  Neurological: She is alert and oriented to person, place, and time. Gait normal.  Skin: Skin is warm and dry. No rash noted. No erythema. No pallor.  Psychiatric: Affect normal.  Nursing note and vitals reviewed.   LABORATORY DATA:. Appointment on 06/22/2016  Component Date Value Ref Range Status  . WBC 06/22/2016 7.0  3.9 - 10.3 10e3/uL Final  . NEUT# 06/22/2016 4.7  1.5 - 6.5 10e3/uL Final  . HGB 06/22/2016 11.4* 11.6 - 15.9 g/dL Final  . HCT 06/22/2016 35.1  34.8 - 46.6 % Final  . Platelets 06/22/2016 316  145 - 400 10e3/uL Final  . MCV 06/22/2016 93.6  79.5 - 101.0 fL Final  . MCH 06/22/2016 30.4  25.1 - 34.0 pg Final  . MCHC 06/22/2016 32.5  31.5 - 36.0 g/dL Final  . RBC 06/22/2016 3.75  3.70 - 5.45 10e6/uL Final  . RDW 06/22/2016 16.3* 11.2 - 14.5 % Final  . lymph# 06/22/2016 1.5  0.9 - 3.3 10e3/uL Final  . MONO# 06/22/2016 0.7  0.1 - 0.9 10e3/uL Final  . Eosinophils Absolute 06/22/2016 0.2  0.0 - 0.5 10e3/uL Final  . Basophils Absolute 06/22/2016 0.0  0.0 - 0.1 10e3/uL Final  . NEUT% 06/22/2016 67.1  38.4 - 76.8 % Final  . LYMPH% 06/22/2016 20.8  14.0 - 49.7 % Final  . MONO% 06/22/2016 9.3  0.0 - 14.0 % Final  . EOS% 06/22/2016 2.4  0.0 - 7.0 % Final  . BASO% 06/22/2016 0.4  0.0 - 2.0 % Final  . Sodium 06/22/2016 135* 136 - 145 mEq/L Final  . Potassium 06/22/2016 5.0  3.5 - 5.1 mEq/L Final  . Chloride 06/22/2016 99  98 - 109 mEq/L Final  . CO2 06/22/2016 24  22 - 29 mEq/L Final  . Glucose 06/22/2016 106  70 - 140 mg/dl Final  . BUN 06/22/2016 29.9* 7.0 - 26.0 mg/dL Final  . Creatinine 06/22/2016 1.4* 0.6 - 1.1 mg/dL  Final  .  Total Bilirubin 06/22/2016 0.51  0.20 - 1.20 mg/dL Final  . Alkaline Phosphatase 06/22/2016 111  40 - 150 U/L Final  . AST 06/22/2016 13  5 - 34 U/L Final  . ALT 06/22/2016 9  0 - 55 U/L Final  . Total Protein 06/22/2016 8.1  6.4 - 8.3 g/dL Final  . Albumin 06/22/2016 3.5  3.5 - 5.0 g/dL Final  . Calcium 06/22/2016 10.5* 8.4 - 10.4 mg/dL Final  . Anion Gap 06/22/2016 13* 3 - 11 mEq/L Final  . EGFR 06/22/2016 38* >90 ml/min/1.73 m2 Final    RADIOGRAPHIC STUDIES: Dg Chest 2 View  Result Date: 06/22/2016 CLINICAL DATA:  Fever and cough since Thursday. History of non-small cell lung cancer status post right upper lobe lobectomy and radiation therapy. EXAM: CHEST  2 VIEW COMPARISON:  Chest x-ray 04/11/2016 and chest CT 05/11/2016 FINDINGS: The cardiac silhouette, mediastinal and hilar contours are within normal limits and stable. Stable tortuosity and calcification of the thoracic aorta. There are new/ progressive radiation changes involving the right paramediastinal lung along with stable surgical changes. No definite infiltrates or effusions. No worrisome pulmonary lesions. The bony thorax is intact. IMPRESSION: New/ progressive radiation changes involving the right paramediastinal lung. No definite acute overlying pulmonary findings such as infiltrate or effusion. Electronically Signed   By: Marijo Sanes M.D.   On: 06/22/2016 11:28   Ct Angio Chest Pe W Or Wo Contrast  Result Date: 06/22/2016 CLINICAL DATA:  Cough and wheezing for 1 week. History of lung cancer and recent radiation therapy. EXAM: CT ANGIOGRAPHY CHEST WITH CONTRAST TECHNIQUE: Multidetector CT imaging of the chest was performed using the standard protocol during bolus administration of intravenous contrast. Multiplanar CT image reconstructions and MIPs were obtained to evaluate the vascular anatomy. CONTRAST:  80 cc Isovue 370 COMPARISON:  Chest CT 05/11/2016 FINDINGS: The chest wall: No breast masses, supraclavicular or axillary  lymphadenopathy. The thyroid gland appears normal. Cardiovascular: The heart is normal in size. No pericardial effusion. Mild tortuosity and calcification of the thoracic aorta but no focal aneurysm or dissection. The branch vessels are patent. Coronary artery calcifications are noted. The pulmonary arterial tree is well opacified. No filling defects to suggest pulmonary embolism. Mediastinum/Nodes: No mediastinal or hilar mass or lymphadenopathy. A few small scattered lymph nodes are noted. Surgical changes in the right hilum from recent right upper lobe lobectomy. No findings for recurrent tumor. Lungs/Pleura: Radiation changes involving the right paramediastinal long. No infiltrates, edema or effusions. Stable emphysematous changes. No worrisome pulmonary lesions. The left lung is clear. No pleural effusions. Upper Abdomen: No significant upper abdominal findings. Musculoskeletal: No significant bony findings. Review of the MIP images confirms the above findings. IMPRESSION: 1. Surgical and radiation changes. No findings for recurrent tumor or metastatic disease. 2. No CT findings for pulmonary embolism. 3. No thoracic aortic dissection or aneurysm. Electronically Signed   By: Marijo Sanes M.D.   On: 06/22/2016 15:11    ASSESSMENT/PLAN:    Non-small cell carcinoma of right lung, stage 2 ([place]) Patient is status post chemotherapy and radiation completed in May 2017.  She continues with observation only.  Her last note from Dr. Brunilda Payor should be scheduled for labs on 08/14/2016, and a follow-up visit on 08/21/2016.  She will also be due a restaging CT around 08/16/2016 prior to her visit with Dr. Julien Nordmann.  Hypercalcemia Patient's corrected calcium was elevated to 10.9 today.  Most likely this is secondary to patient's dehydration today.  Patient  received IV fluid rehydration while cancer Center today.  We'll continue to monitor closely.  Dehydration Patient has been treated recently for  bronchitis; and may very well be suffering with some pneumonitis.  Patient has been undergoing antibiotics therapy as well; and states that she has had decreased appetite and poor oral intake.  She feels dehydrated today.  She will receive IV fluid rehydration while at the cancer Center today.  She was encouraged to push fluids is much as possible so.  Bronchitis Patient presented to the [facility] today with complaint of continued, congested cough productive of dark yellow secretions.  She has been on Augmentin for approximately 8 days with minimal improvement.  She continues with fevers to a maximum of 101.0 as well.  She states that Tylenol relieves her fever.  Patient also feels increasingly fatigued and weak.  Exam today reveals some rhonchi and wheezing to all lung fields; but worse on the right side.  Vital signs are stable and patient was afebrile with temperature 98.6.  Chest x-ray obtained today revealed: IMPRESSION: New/ progressive radiation changes involving the right paramediastinal lung. No definite acute overlying pulmonary findings such as infiltrate or effusion.  CT angio of the chest revealed:   IMPRESSION: 1. Surgical and radiation changes. No findings for recurrent tumor or metastatic disease. 2. No CT findings for pulmonary embolism. 3. No thoracic aortic dissection or aneurysm.  Reviewed all findings with Dr. Benay Spice on call physician; and he agreed that patient may very well be experiencing some previous radiation-induced pneumonitis.  Patient was advised to finish all of her Augmentin prescription.  She had a really begun; and will also call in a Medrol dose pack to see if this helps with patient's symptoms.  If patient does respond to the Medrol Dosepak-will need to consider continuing a tapering dose of prednisone.  Patient was advised to call/return of electrical to the emergency department for any worsening symptoms whatsoever.   Patient stated  understanding of all instructions; and was in agreement with this plan of care. The patient knows to call the clinic with any problems, questions or concerns.   Total time spent with patient was 25 minutes;  with greater than 75 percent of that time spent in face to face counseling regarding patient's symptoms,  and coordination of care and follow up.  Disclaimer:This dictation was prepared with Dragon/digital dictation along with [employer]. Any transcriptional errors that result from this process are unintentional.  Drue Second, NP 06/26/2016

## 2016-06-26 NOTE — Assessment & Plan Note (Signed)
Patient presented to the [facility] today with complaint of continued, congested cough productive of dark yellow secretions.  She has been on Augmentin for approximately 8 days with minimal improvement.  She continues with fevers to a maximum of 101.0 as well.  She states that Tylenol relieves her fever.  Patient also feels increasingly fatigued and weak.  Exam today reveals some rhonchi and wheezing to all lung fields; but worse on the right side.  Vital signs are stable and patient was afebrile with temperature 98.6.  Chest x-ray obtained today revealed: IMPRESSION: New/ progressive radiation changes involving the right paramediastinal lung. No definite acute overlying pulmonary findings such as infiltrate or effusion.  CT angio of the chest revealed:   IMPRESSION: 1. Surgical and radiation changes. No findings for recurrent tumor or metastatic disease. 2. No CT findings for pulmonary embolism. 3. No thoracic aortic dissection or aneurysm.  Reviewed all findings with Dr. Benay Spice on call physician; and he agreed that patient may very well be experiencing some previous radiation-induced pneumonitis.  Patient was advised to finish all of her Augmentin prescription.  She had a really begun; and will also call in a Medrol dose pack to see if this helps with patient's symptoms.  If patient does respond to the Medrol Dosepak-will need to consider continuing a tapering dose of prednisone.  Patient was advised to call/return of electrical to the emergency department for any worsening symptoms whatsoever.

## 2016-06-26 NOTE — Assessment & Plan Note (Signed)
Patient presented to the [facility] today with complaint of continued, congested cough productive of dark yellow secretions.  She has been on Augmentin for approximately 8 days with minimal improvement.  She continues with fevers to a maximum of 101.0 as well.  She states that Tylenol relieves her fever.  Patient also feels increasingly fatigued and weak.  Exam today reveals some rhonchi and wheezing to all lung fields; but worse on the right side.  Vital signs are stable and patient was afebrile with temperature 98.6.  Chest x-ray obtained today revealed: IMPRESSION: New/ progressive radiation changes involving the right paramediastinal lung. No definite acute overlying pulmonary findings such as infiltrate or effusion.  CT angio of the chest revealed:   IMPRESSION: 1. Surgical and radiation changes. No findings for recurrent tumor or metastatic disease. 2. No CT findings for pulmonary embolism. 3. No thoracic aortic dissection or aneurysm.  Reviewed all findings with Dr. Benay Spice on call physician; and he agreed that patient may very well be experiencing some previous radiation-induced pneumonitis.  Patient was advised to finish all of her Augmentin prescription.  She had a really begun; and will also call in a Medrol dose pack to see if this helps with patient's symptoms.  If patient does respond to the Medrol Dosepak-will need to consider continuing a tapering dose of prednisone.  Patient was advised to call/return of electrical to the emergency department for any worsening symptoms whatsoever. ________________________________________  Update: Patient presented back to the cancer Center this morning for recheck of her symptoms.  Patient states that she continues with a very congested cough and low-grade fevers only.  She confirmed that she does have albuterol nebulizer treatments that she can take at home.  Advised patient to try using the nebulizer treatments on an every 6 hour  basis when she is wheezing.  She states that she went to the pharmacy to pick up the [place]; but it was on available.  Apparently the prescription was never electronically prescribed.  Apologized to patient; and confirmed that Medrol Dosepak was electronically prescribed for the patient today.  Also, patient requested a prescription for Hycodan cough syrup as well.  This prescription was printed and given to the patient while the [facility].  Patient was advised to call/return or go directly to the emergency department for any worsening symptoms whatsoever.

## 2016-06-26 NOTE — Assessment & Plan Note (Signed)
Patient is status post chemotherapy and radiation completed in May 2017.  She continues with observation only.  Her last note from Dr. Brunilda Payor should be scheduled for labs on 08/14/2016, and a follow-up visit on 08/21/2016.  She will also be due a restaging CT around 08/16/2016 prior to her visit with Dr. Julien Nordmann.

## 2016-06-28 ENCOUNTER — Telehealth: Payer: Self-pay | Admitting: Nurse Practitioner

## 2016-06-28 ENCOUNTER — Other Ambulatory Visit: Payer: Self-pay | Admitting: Nurse Practitioner

## 2016-06-28 DIAGNOSIS — C3491 Malignant neoplasm of unspecified part of right bronchus or lung: Secondary | ICD-10-CM

## 2016-06-28 NOTE — Telephone Encounter (Signed)
Called patient to follow-up with her regarding her bronchitis/questionable pneumonitis.  Patient states that her cough has slightly improved; and she's had no fevers for the past 2 days.  She is feeling some better already.  She is questioning if she should be called in additional tapering steroids.  Advised patient would review all with Dr. Julien Nordmann; and then the nurse would call her back with that information.  Also, reviewed .  Most recent CT angiogram of the chest with Dr. Julien Nordmann; and he recommended that patient have her next restaging CT of the chest with contrast approximately 3 months after the CT angiogram of the chest.  Have canceled the October restaging scan; and have placed a new order for a restaging CT with contrast of the chest for around 09/20/2016.  Patient stated understanding of all instructions; and was in agreement with this plan of care.

## 2016-06-28 NOTE — Telephone Encounter (Signed)
Reviewed my most recent phone call to  the patient with Dr. Julien Nordmann; and he recommended allowing the patient to complete her Medrol Dosepak; and then see how she does.  If patient's symptoms return or worsen-May need to consider low-dose tapering prednisone for treatment of pneumonitis.  This provider then called patient on her listed cell phone number.  Patient had already given permission to this provider to call and leave a message on this number-even though it is answered as a counseling service business.  She states that this is her actual cell phone number; and only she answers the phone and the messages.  Reviewed all details of Dr. Worthy Flank instructions, the a voicemail to the patient.  The plan is to call the patient back on Monday, 07/02/2016 for a follow-up check. _______________________________  Update: Patient just called this provider back and all information was relayed to her.  She is comfortable with waiting till Monday, 07/02/2016 for follow-up call.  She knows to go to the emergency department over the weekend for any worsening symptoms whatsoever.

## 2016-07-02 ENCOUNTER — Telehealth: Payer: Self-pay | Admitting: *Deleted

## 2016-07-02 NOTE — Telephone Encounter (Signed)
TC to patient to f/u with her after steroid taper complete.  Spoke with patient. She finished steroid taper on Saturday. Patient states she did well up until yesterday when she started having fevers again. Temp today was 100.2  Patient also stated that her breathing was rough last night with increased wheezing and increased cough. She states the wheezing is better today, still has a cough and hoarseness. Made Granville Lewis, NP aware.

## 2016-07-03 ENCOUNTER — Other Ambulatory Visit: Payer: Self-pay | Admitting: Hematology

## 2016-07-03 DIAGNOSIS — C642 Malignant neoplasm of left kidney, except renal pelvis: Secondary | ICD-10-CM | POA: Diagnosis not present

## 2016-07-03 DIAGNOSIS — N3941 Urge incontinence: Secondary | ICD-10-CM | POA: Diagnosis not present

## 2016-07-04 ENCOUNTER — Encounter: Payer: Self-pay | Admitting: Nurse Practitioner

## 2016-07-04 ENCOUNTER — Other Ambulatory Visit: Payer: Self-pay | Admitting: Nurse Practitioner

## 2016-07-04 ENCOUNTER — Ambulatory Visit (HOSPITAL_BASED_OUTPATIENT_CLINIC_OR_DEPARTMENT_OTHER): Payer: Medicare Other

## 2016-07-04 ENCOUNTER — Ambulatory Visit (HOSPITAL_BASED_OUTPATIENT_CLINIC_OR_DEPARTMENT_OTHER): Payer: Medicare Other | Admitting: Nurse Practitioner

## 2016-07-04 VITALS — BP 130/68 | HR 98 | Temp 98.9°F | Resp 16 | Ht 63.0 in | Wt 166.5 lb

## 2016-07-04 DIAGNOSIS — E860 Dehydration: Secondary | ICD-10-CM

## 2016-07-04 DIAGNOSIS — C3491 Malignant neoplasm of unspecified part of right bronchus or lung: Secondary | ICD-10-CM

## 2016-07-04 DIAGNOSIS — J700 Acute pulmonary manifestations due to radiation: Secondary | ICD-10-CM | POA: Diagnosis not present

## 2016-07-04 LAB — COMPREHENSIVE METABOLIC PANEL
ALT: 9 U/L (ref 0–55)
AST: 12 U/L (ref 5–34)
Albumin: 3.4 g/dL — ABNORMAL LOW (ref 3.5–5.0)
Alkaline Phosphatase: 100 U/L (ref 40–150)
Anion Gap: 11 mEq/L (ref 3–11)
BUN: 30.8 mg/dL — ABNORMAL HIGH (ref 7.0–26.0)
CO2: 25 mEq/L (ref 22–29)
Calcium: 10.7 mg/dL — ABNORMAL HIGH (ref 8.4–10.4)
Chloride: 105 mEq/L (ref 98–109)
Creatinine: 1.4 mg/dL — ABNORMAL HIGH (ref 0.6–1.1)
EGFR: 39 mL/min/{1.73_m2} — ABNORMAL LOW (ref >90)
Glucose: 101 mg/dl (ref 70–140)
Potassium: 5.1 mEq/L (ref 3.5–5.1)
Sodium: 141 mEq/L (ref 136–145)
Total Bilirubin: 0.54 mg/dL (ref 0.20–1.20)
Total Protein: 7.7 g/dL (ref 6.4–8.3)

## 2016-07-04 LAB — CBC WITH DIFFERENTIAL/PLATELET
BASO%: 0.2 % (ref 0.0–2.0)
Basophils Absolute: 0 10*3/uL (ref 0.0–0.1)
EOS%: 4.2 % (ref 0.0–7.0)
Eosinophils Absolute: 0.4 10*3/uL (ref 0.0–0.5)
HCT: 36.2 % (ref 34.8–46.6)
HGB: 11.8 g/dL (ref 11.6–15.9)
LYMPH%: 12.9 % — ABNORMAL LOW (ref 14.0–49.7)
MCH: 30.3 pg (ref 25.1–34.0)
MCHC: 32.6 g/dL (ref 31.5–36.0)
MCV: 92.8 fL (ref 79.5–101.0)
MONO#: 1 10*3/uL — ABNORMAL HIGH (ref 0.1–0.9)
MONO%: 11.8 % (ref 0.0–14.0)
NEUT#: 6.1 10*3/uL (ref 1.5–6.5)
NEUT%: 70.9 % (ref 38.4–76.8)
Platelets: 303 10*3/uL (ref 145–400)
RBC: 3.9 10*6/uL (ref 3.70–5.45)
RDW: 16.2 % — ABNORMAL HIGH (ref 11.2–14.5)
WBC: 8.5 10*3/uL (ref 3.9–10.3)
lymph#: 1.1 10*3/uL (ref 0.9–3.3)

## 2016-07-04 MED ORDER — SODIUM CHLORIDE 0.9 % IV SOLN
INTRAVENOUS | Status: AC
  Administered 2016-07-04: 11:00:00 via INTRAVENOUS

## 2016-07-04 MED ORDER — PREDNISONE 10 MG PO TABS: ORAL_TABLET | ORAL | 0 refills | Status: DC

## 2016-07-04 NOTE — Patient Instructions (Signed)

## 2016-07-04 NOTE — Assessment & Plan Note (Addendum)
Patient continues with hypercalcemia.  Corrected calcium today was 11.18.  Most likely, patient's hypercalcemia secondary to patient's disease and dehydration.  Will continue to monitor closely.  Also, patient stated that she has been taking a calcium supplement on a daily basis.  Patient was advised to hold any further calcium supplementation whatsoever.

## 2016-07-04 NOTE — Assessment & Plan Note (Signed)
Patient is status post chemotherapy and radiation completed in May 2017.  She continues with observation only.  Her last note from Dr. Brunilda Payor should be scheduled for labs on 08/14/2016, and a follow-up visit on 08/21/2016.  She will also be due a restaging CT around 09/20/2016 or 09/21/2016 prior to her visit with Dr. Julien Nordmann.  This order has already been placed.

## 2016-07-04 NOTE — Assessment & Plan Note (Signed)
Patient presented to the [facility] today.  Receive IV fluid rehydration.  She states that she was seen her urologist just yesterday; and advised that she looked dehydrated and would require some IV fluid rehydration.  She will receive IV fluid rehydration while the cancer Center today.  She was also encouraged to push fluids at home is much as possible

## 2016-07-04 NOTE — Progress Notes (Addendum)
SYMPTOM MANAGEMENT CLINIC    Chief Complaint: Bronchitis  HPI:  Tammy Ball 75 y.o. female diagnosed with lung cancer.  Patient is status post both chemotherapy and radiation treatments.  Currently undergoing observation only.  Patient presented to the [facility] today with complaint of continued bronchitis symptoms.   Oncology History   Patient followed due to hx NSCLC stage IIA squamous cell dx 09/2015 s/p neoadjuvant chemotherapy   Non-small cell carcinoma of right lung, stage 2 ([place])   Staging form: Lung, AJCC 7th Edition     Clinical stage from 10/03/2015: Stage IIA (T2a, N1, M0) - Signed by Curt Bears, MD on 10/03/2015 Renal cell cancer [facility])   Staging form: Kidney, AJCC 7th Edition     Clinical: No stage assigned - Unsigned       Non-small cell carcinoma of right lung, stage 2 ([place])   09/19/2015 Imaging    CTA Irregular opacity in the medial right upper lobe extending to and possibly arising from the right hilum.       09/20/2015 Surgery    VIDEO BRONCHOSCOPY WITHOUT FLUORO      09/29/2015 Imaging    PET IMPRESSION: 4 cm hypermetabolic central right upper lobe mass, consistent with primary bronchogenic carcinoma. This is contiguous with the right hilum.      10/03/2015 Initial Diagnosis    Non-small cell carcinoma of right lung, stage 2 ([place])      10/11/2015 - 11/22/2015 Chemotherapy    taxol/carbo      10/21/2015 Imaging    MRI Brain No pathologic enhancement to suggest metastatic disease of the brain or meninges.      11/19/2015 Imaging    CXR IMPRESSION: There is airspace consolidation in the right upper lobe medially      12/13/2015 Imaging    CT Chest IMPRESSION: Marked response to therapy. Significant decrease in size of a central right upper lobe pulmonary nodule with increase in cavitation.      01/18/2016 Surgery    PROCEDURES: Video bronchoscopy, right video-assisted thoracoscopy, mini thoracotomy, right upper lobectomy with lymph node  dissection and placement of On-Q device.      02/21/2016 -  Radiation Therapy    Adjuvant tx [facility]      02/27/2016 -  Chemotherapy    1st adjuvant chemotherapy Carbo/Taxol       Review of Systems  Constitutional: Positive for fever and malaise/fatigue.  Respiratory: Positive for cough, sputum production and wheezing.   Neurological: Positive for weakness.  All other systems reviewed and are negative.   Past Medical History:  Diagnosis Date  . Anemia    was while doing chemo  . Anxiety   . Asthma   . Chemotherapy-induced neuropathy ([place]) 11/01/2015  . Claustrophobia   . COPD (chronic obstructive pulmonary disease) ([place])   . Depression   . Encounter for antineoplastic chemotherapy 02/09/2016  . Glaucoma   . Headache    prior to menopause  . Heart murmur   . History of echocardiogram    Echo 2/17: EF 73-71%, grade 1 diastolic dysfunction, mild MR, trivial pericardial effusion  . History of nuclear stress test    Myoview 2/17: no ischemia or scar, EF 79%; low risk  . Hyperlipidemia   . Hypertension   . Insomnia 05/16/2016  . Non-small cell carcinoma of right lung, stage 2 ([place]) 10/03/2015  . Radiation 02/29/16-04/10/16   50.4 Gy to right central chest  . Renal cell carcinoma (HCC)    L nephrectomy  in  2012    Past Surgical History:  Procedure Laterality Date  . BACK SURGERY     cervical 1991  . EYE SURGERY    . kidney cancer    . NEPHRECTOMY    . SPINE SURGERY    . TUBAL LIGATION    . VIDEO ASSISTED THORACOSCOPY (VATS)/WEDGE RESECTION Right 01/18/2016   Procedure: VIDEO ASSISTED THORACOSCOPY (VATS)/LUNG RESECTION, THOROCOTOMY, RIGHT UPPER LOBECTOMY, LYMPH NODE DISSECTION, PLACEMENT OF ON Q;  Surgeon: [name], MD;  Location: [facility];  Service: Thoracic;  Laterality: Right;  Marland Kitchen VIDEO BRONCHOSCOPY Bilateral 09/20/2015   Procedure: VIDEO BRONCHOSCOPY WITHOUT FLUORO;  Surgeon: Rigoberto Noel, MD;  Location: [facility];  Service: Cardiopulmonary;  Laterality: Bilateral;   . VIDEO BRONCHOSCOPY N/A 01/18/2016   Procedure: VIDEO BRONCHOSCOPY;  Surgeon: [name], MD;  Location: [facility] OR;  Service: Thoracic;  Laterality: N/A;    has Essential hypertension; COPD GOLD II ; Hearing loss; Renal cell cancer ([place]); Sciatica of left side; Depression; Glaucoma; Spondylolisthesis of lumbar region; Hyperlipidemia; Morbid obesity ([place]); Non-small cell carcinoma of right lung, stage 2 ([place]); Chemotherapy-induced neuropathy ([place]); S/P lobectomy of lung; Pneumonitis, radiation ([place]); Insomnia; Dehydration; and Hypercalcemia on her problem list.    is allergic to bee venom; amlodipine; levofloxacin; and hctz [hydrochlorothiazide].    Medication List       Accurate as of 07/04/16  1:02 PM. Always use your most recent med list.          acetaminophen 500 MG tablet Commonly known as:  TYLENOL Take 1,000 mg by mouth every 6 (six) hours as needed for mild pain, moderate pain, fever or headache. Reported on 05/24/2016   albuterol 108 (90 Base) MCG/ACT inhaler Commonly known as:  PROVENTIL HFA;VENTOLIN HFA Inhale 2 puffs into the lungs every 4 (four) hours as needed for wheezing or shortness of breath.   aspirin EC 81 MG tablet Take 81 mg by mouth daily. Reported on 04/11/2016   atorvastatin 40 MG tablet Commonly known as:  LIPITOR TAKE ONE TABLET BY MOUTH ONCE DAILY.   calcium carbonate 1500 (600 Ca) MG Tabs tablet Commonly known as:  OSCAL Take by mouth 2 (two) times daily with a meal.   fish oil-omega-3 fatty acids 1000 MG capsule Take 1 capsule by mouth daily.   fluticasone 50 MCG/ACT nasal spray Commonly known as:  FLONASE Place 2 sprays into both nostrils daily.   HYDROcodone-homatropine 5-1.5 MG/5ML syrup Commonly known as:  HYCODAN Take 5 mLs by mouth every 6 (six) hours as needed for cough.   LORazepam 0.5 MG tablet Commonly known as:  ATIVAN Take 1 tablet (0.5 mg total) by mouth 2 (two) times daily as needed for anxiety.   methylPREDNISolone 4 MG  Tbpk tablet Commonly known as:  MEDROL DOSEPAK Medrol dose pak: take as directed.   predniSONE 10 MG tablet Commonly known as:  DELTASONE Take 2 tabs (20 mg) PO Q AM x 14 days; then 1 tab (10 mg) PO Q AM x 14 days; then 1/2 tab (5 mg) PO Q AM x 1 week; then take 1/2 tab (5 mg) PO Q AM every other day till gone.   pseudoephedrine-guaifenesin 60-600 MG 12 hr tablet Commonly known as:  MUCINEX D Take 1 tablet by mouth every 12 (twelve) hours. Reported on 05/24/2016   REFRESH 1.4-0.6 % ophthalmic solution Generic drug:  polyvinyl alcohol-povidone Place 2 drops into both eyes daily as needed (for dry eyes). Reported on 05/24/2016   temazepam 15 MG capsule  Commonly known as:  RESTORIL Take 1 capsule (15 mg total) by mouth at bedtime as needed for sleep.   traMADol 50 MG tablet Commonly known as:  ULTRAM Take 50 mg by mouth every 6 (six) hours as needed. Reported on 05/24/2016   UNABLE TO FIND Med Name: Wig   valsartan 80 MG tablet Commonly known as:  DIOVAN Take 1 tablet (80 mg total) by mouth daily.        PHYSICAL EXAMINATION  Oncology Vitals 07/04/2016 06/25/2016  Height 160 cm -  Weight 75.524 kg -  Weight (lbs) 166 lbs 8 oz -  BMI (kg/m2) 29.49 kg/m2 -  Temp 99.3 99.6  Pulse 113 118  Resp 17 20  SpO2 94 92  BSA (m2) 1.83 m2 -   BP Readings from Last 2 Encounters:  07/04/16 124/63  06/25/16 (!) 151/92    Physical Exam  Constitutional: She is oriented to person, place, and time. Vital signs are normal. She appears malnourished and dehydrated. She appears unhealthy. She appears cachectic.  HENT:  Head: Normocephalic and atraumatic.  Mouth/Throat: Oropharynx is clear and moist.  Eyes: Conjunctivae and EOM are normal. Pupils are equal, round, and reactive to light. Right eye exhibits no discharge. Left eye exhibits no discharge. No scleral icterus.  Neck: Normal range of motion. Neck supple. No JVD present. No tracheal deviation present. No thyromegaly present.    Cardiovascular: Normal rate, regular rhythm, normal heart sounds and intact distal pulses.   Pulmonary/Chest: Effort normal. No respiratory distress. She has wheezes. She has rales. She exhibits no tenderness.  Abdominal: Soft. Bowel sounds are normal. She exhibits no distension and no mass. There is no tenderness. There is no rebound and no guarding.  Musculoskeletal: Normal range of motion. She exhibits no edema or tenderness.  Lymphadenopathy:    She has no cervical adenopathy.  Neurological: She is alert and oriented to person, place, and time. Gait normal.  Skin: Skin is warm and dry. No rash noted. No erythema. No pallor.  Psychiatric: Affect normal.  Nursing note and vitals reviewed.   LABORATORY DATA:. Appointment on 07/04/2016  Component Date Value Ref Range Status  . WBC 07/04/2016 8.5  3.9 - 10.3 10e3/uL Final  . NEUT# 07/04/2016 6.1  1.5 - 6.5 10e3/uL Final  . HGB 07/04/2016 11.8  11.6 - 15.9 g/dL Final  . HCT 07/04/2016 36.2  34.8 - 46.6 % Final  . Platelets 07/04/2016 303  145 - 400 10e3/uL Final  . MCV 07/04/2016 92.8  79.5 - 101.0 fL Final  . MCH 07/04/2016 30.3  25.1 - 34.0 pg Final  . MCHC 07/04/2016 32.6  31.5 - 36.0 g/dL Final  . RBC 07/04/2016 3.90  3.70 - 5.45 10e6/uL Final  . RDW 07/04/2016 16.2* 11.2 - 14.5 % Final  . lymph# 07/04/2016 1.1  0.9 - 3.3 10e3/uL Final  . MONO# 07/04/2016 1.0* 0.1 - 0.9 10e3/uL Final  . Eosinophils Absolute 07/04/2016 0.4  0.0 - 0.5 10e3/uL Final  . Basophils Absolute 07/04/2016 0.0  0.0 - 0.1 10e3/uL Final  . NEUT% 07/04/2016 70.9  38.4 - 76.8 % Final  . LYMPH% 07/04/2016 12.9* 14.0 - 49.7 % Final  . MONO% 07/04/2016 11.8  0.0 - 14.0 % Final  . EOS% 07/04/2016 4.2  0.0 - 7.0 % Final  . BASO% 07/04/2016 0.2  0.0 - 2.0 % Final  . Sodium 07/04/2016 141  136 - 145 mEq/L Final  . Potassium 07/04/2016 5.1  3.5 - 5.1 mEq/L Final  .  Chloride 07/04/2016 105  98 - 109 mEq/L Final  . CO2 07/04/2016 25  22 - 29 mEq/L Final  . Glucose  07/04/2016 101  70 - 140 mg/dl Final  . BUN 07/04/2016 30.8* 7.0 - 26.0 mg/dL Final  . Creatinine 07/04/2016 1.4* 0.6 - 1.1 mg/dL Final  . Total Bilirubin 07/04/2016 0.54  0.20 - 1.20 mg/dL Final  . Alkaline Phosphatase 07/04/2016 100  40 - 150 U/L Final  . AST 07/04/2016 12  5 - 34 U/L Final  . ALT 07/04/2016 9  0 - 55 U/L Final  . Total Protein 07/04/2016 7.7  6.4 - 8.3 g/dL Final  . Albumin 07/04/2016 3.4* 3.5 - 5.0 g/dL Final  . Calcium 07/04/2016 10.7* 8.4 - 10.4 mg/dL Final  . Anion Gap 07/04/2016 11  3 - 11 mEq/L Final  . EGFR 07/04/2016 39* >90 ml/min/1.73 m2 Final    RADIOGRAPHIC STUDIES: No results found.  ASSESSMENT/PLAN:    Pneumonitis, radiation [facility]) Patient presented to the [facility] today with complaint of continued, congested cough productive of dark yellow secretions.  She has been on Augmentin for approximately 8 days with minimal improvement.  She continues with fevers to a maximum of 101.0 as well.  She states that Tylenol relieves her fever.  Patient also feels increasingly fatigued and weak.  Exam today reveals some rhonchi and wheezing to all lung fields; but worse on the right side.  Vital signs are stable and patient was afebrile with temperature 98.6.  Chest x-ray obtained today revealed: IMPRESSION: New/ progressive radiation changes involving the right paramediastinal lung. No definite acute overlying pulmonary findings such as infiltrate or effusion.  CT angio of the chest revealed:   IMPRESSION: 1. Surgical and radiation changes. No findings for recurrent tumor or metastatic disease. 2. No CT findings for pulmonary embolism. 3. No thoracic aortic dissection or aneurysm.  Reviewed all findings with Dr. Benay Spice on call physician; and he agreed that patient may very well be experiencing some previous radiation-induced pneumonitis.  Patient was advised to finish all of her Augmentin prescription.  She had a really begun; and will also call in a  Medrol dose pack to see if this helps with patient's symptoms.  If patient does respond to the Medrol Dosepak-will need to consider continuing a tapering dose of prednisone.  Patient was advised to call/return of electrical to the emergency department for any worsening symptoms whatsoever. ________________________________________  Update: Patient presented back to the cancer Center this morning for recheck of her symptoms.  Patient states that she continues with a very congested cough and low-grade fevers only.  She confirmed that she does have albuterol nebulizer treatments that she can take at home.  Advised patient to try using the nebulizer treatments on an every 6 hour basis when she is wheezing.  She states that she went to the pharmacy to pick up the [place]; but it was on available.  Apparently the prescription was never electronically prescribed.  Apologized to patient; and confirmed that Medrol Dosepak was electronically prescribed for the patient today.  Also, patient requested a prescription for Hycodan cough syrup as well.  This prescription was printed and given to the patient while the [place].  Patient was advised to call/return or go directly to the emergency department for any worsening symptoms whatsoever. ___________________________________________________  Update patient presented to the [facility] today for recheck and receive IV fluid rehydration.  She states that she completed the Medrol Dosepak this past weekend.  She  states that while taking the Medrol Dosepak-her symptoms almost completely resolved; but  all symptoms returned what she completed the steroids.  Reviewed all findings with Dr. 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  Patient will be given a prescription today for prednisone 10 mg tablets.  Instructions will be for patient to take 20 mg of prednisone daily for a  total of 2 weeks; followed by prednisone 10 mg daily for 2 weeks; followed by prednisone 5 mg daily 1 week; and then prednisone 5 mg every other day for 1 week until call.  Patient was advised to call/return or go to the emergency department for any worsening symptoms whatsoever.    Non-small cell carcinoma of right lung, stage 2 ([place]) Patient is status post chemotherapy and radiation completed in May 2017.  She continues with observation only.  Her last note from Dr. Brunilda Payor should be scheduled for labs on 08/14/2016, and a follow-up visit on 08/21/2016.  She will also be due a restaging CT around 09/20/2016 or 09/21/2016 prior to her visit with Dr. Julien Nordmann.  This order has already been placed.  Hypercalcemia Patient continues with hypercalcemia.  Corrected calcium today was 11.18.  Most likely, patient's hypercalcemia secondary to patient's disease and dehydration.  Will continue to monitor closely.  Also, patient stated that she has been taking a calcium supplement on a daily basis.  Patient was advised to hold any further calcium supplementation whatsoever.  Dehydration Patient presented to the [facility] today.  Receive IV fluid rehydration.  She states that she was seen her urologist just yesterday; and advised that she looked dehydrated and would require some IV fluid rehydration.  She will receive IV fluid rehydration while the cancer Center today.  She was also encouraged to push fluids at home is much as possible   Patient stated understanding of all instructions; and was in agreement with this plan of care. The patient knows to call the clinic with any problems, questions or concerns.   Total time spent with patient was 25 minutes;  with greater than 75 percent of that time spent in face to face counseling regarding patient's symptoms,  and coordination of care and follow up.  Disclaimer:This dictation was prepared with Dragon/digital dictation along with [employer]. Any transcriptional errors that result from this process are unintentional.  Drue Second, NP 07/04/2016

## 2016-07-04 NOTE — Assessment & Plan Note (Signed)
Patient presented to the [facility] today with complaint of continued, congested cough productive of dark yellow secretions.  She has been on Augmentin for approximately 8 days with minimal improvement.  She continues with fevers to a maximum of 101.0 as well.  She states that Tylenol relieves her fever.  Patient also feels increasingly fatigued and weak.  Exam today reveals some rhonchi and wheezing to all lung fields; but worse on the right side.  Vital signs are stable and patient was afebrile with temperature 98.6.  Chest x-ray obtained today revealed: IMPRESSION: New/ progressive radiation changes involving the right paramediastinal lung. No definite acute overlying pulmonary findings such as infiltrate or effusion.  CT angio of the chest revealed:   IMPRESSION: 1. Surgical and radiation changes. No findings for recurrent tumor or metastatic disease. 2. No CT findings for pulmonary embolism. 3. No thoracic aortic dissection or aneurysm.  Reviewed all findings with Dr. Benay Spice on call physician; and he agreed that patient may very well be experiencing some previous radiation-induced pneumonitis.  Patient was advised to finish all of her Augmentin prescription.  She had a really begun; and will also call in a Medrol dose pack to see if this helps with patient's symptoms.  If patient does respond to the Medrol Dosepak-will need to consider continuing a tapering dose of prednisone.  Patient was advised to call/return of electrical to the emergency department for any worsening symptoms whatsoever. ________________________________________  Update: Patient presented back to the cancer Center this morning for recheck of her symptoms.  Patient states that she continues with a very congested cough and low-grade fevers only.  She confirmed that she does have albuterol nebulizer treatments that she can take at home.  Advised patient to try using the nebulizer treatments on an every 6 hour  basis when she is wheezing.  She states that she went to the pharmacy to pick up the [place]; but it was on available.  Apparently the prescription was never electronically prescribed.  Apologized to patient; and confirmed that Medrol Dosepak was electronically prescribed for the patient today.  Also, patient requested a prescription for Hycodan cough syrup as well.  This prescription was printed and given to the patient while the [place].  Patient was advised to call/return or go directly to the emergency department for any worsening symptoms whatsoever. ___________________________________________________  Update patient presented to the [facility] today for recheck and receive IV fluid rehydration.  She states that she completed the Medrol Dosepak this past weekend.  She states that while taking the Medrol Dosepak-her symptoms almost completely resolved; but  all symptoms returned what she completed the steroids.  Reviewed all findings with Dr. 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  Patient will be given a prescription today for prednisone 10 mg tablets.  Instructions will be for patient to take 20 mg of prednisone daily for a total of 2 weeks; followed by prednisone 10 mg daily for 2 weeks; followed by prednisone 5 mg daily 1 week; and then prednisone 5 mg every other day for 1 week until call.  Patient was advised to call/return or go to the emergency department for any worsening symptoms whatsoever.

## 2016-07-09 ENCOUNTER — Telehealth: Payer: Self-pay

## 2016-07-09 NOTE — Telephone Encounter (Signed)
Called to check on pt and see how she was feeling with the long term steroids.  Pt stated that she was feeling much better.  S/s were almost completely gone.  Told pt to call if she had any worsening s/s.  Pt verbalized understanding

## 2016-07-18 ENCOUNTER — Other Ambulatory Visit: Payer: Self-pay | Admitting: Family Medicine

## 2016-07-18 DIAGNOSIS — I10 Essential (primary) hypertension: Secondary | ICD-10-CM

## 2016-07-20 ENCOUNTER — Ambulatory Visit (INDEPENDENT_AMBULATORY_CARE_PROVIDER_SITE_OTHER): Payer: Medicare Other

## 2016-07-20 ENCOUNTER — Ambulatory Visit (INDEPENDENT_AMBULATORY_CARE_PROVIDER_SITE_OTHER): Payer: Medicare Other | Admitting: Family Medicine

## 2016-07-20 ENCOUNTER — Encounter: Payer: Self-pay | Admitting: Family Medicine

## 2016-07-20 VITALS — BP 158/78 | HR 107 | Temp 98.7°F | Resp 18 | Ht 63.0 in | Wt 169.4 lb

## 2016-07-20 DIAGNOSIS — R062 Wheezing: Secondary | ICD-10-CM

## 2016-07-20 DIAGNOSIS — R059 Cough, unspecified: Secondary | ICD-10-CM

## 2016-07-20 DIAGNOSIS — C3491 Malignant neoplasm of unspecified part of right bronchus or lung: Secondary | ICD-10-CM | POA: Diagnosis not present

## 2016-07-20 DIAGNOSIS — R05 Cough: Secondary | ICD-10-CM | POA: Diagnosis not present

## 2016-07-20 DIAGNOSIS — J700 Acute pulmonary manifestations due to radiation: Secondary | ICD-10-CM | POA: Diagnosis not present

## 2016-07-20 DIAGNOSIS — J441 Chronic obstructive pulmonary disease with (acute) exacerbation: Secondary | ICD-10-CM | POA: Diagnosis not present

## 2016-07-20 LAB — POCT CBC
Granulocyte percent: 78.9 %G (ref 37–80)
HCT, POC: 33.5 % — AB (ref 37.7–47.9)
Hemoglobin: 11.3 g/dL — AB (ref 12.2–16.2)
Lymph, poc: 1.1 (ref 0.6–3.4)
MCH, POC: 31.2 pg (ref 27–31.2)
MCHC: 33.8 g/dL (ref 31.8–35.4)
MCV: 92.3 fL (ref 80–97)
MID (cbc): 0.6 (ref 0–0.9)
MPV: 6.4 fL (ref 0–99.8)
POC Granulocyte: 6.5 (ref 2–6.9)
POC LYMPH PERCENT: 13.2 %L (ref 10–50)
POC MID %: 7.9 %M (ref 0–12)
Platelet Count, POC: 328 10*3/uL (ref 142–424)
RBC: 3.62 M/uL — AB (ref 4.04–5.48)
RDW, POC: 18.5 %
WBC: 8.2 10*3/uL (ref 4.6–10.2)

## 2016-07-20 MED ORDER — DOXYCYCLINE HYCLATE 100 MG PO TABS: 100.0000 mg | ORAL_TABLET | Freq: Two times a day (BID) | ORAL | 0 refills | Status: DC

## 2016-07-20 MED ORDER — PREDNISONE 20 MG PO TABS: 20.0000 mg | ORAL_TABLET | Freq: Every day | ORAL | 0 refills | Status: DC

## 2016-07-20 MED ORDER — ALBUTEROL SULFATE (2.5 MG/3ML) 0.083% IN NEBU
2.5000 mg | INHALATION_SOLUTION | Freq: Once | RESPIRATORY_TRACT | Status: AC
  Administered 2016-07-20: 2.5 mg via RESPIRATORY_TRACT

## 2016-07-20 MED ORDER — IPRATROPIUM BROMIDE 0.02 % IN SOLN
0.5000 mg | Freq: Once | RESPIRATORY_TRACT | Status: AC
  Administered 2016-07-20: 0.5 mg via RESPIRATORY_TRACT

## 2016-07-20 NOTE — Patient Instructions (Addendum)
IF you received an x-ray today, you will receive an invoice from [facility]. Please contact [facility] at [phone] with questions or concerns regarding your invoice.   IF you received labwork today, you will receive an invoice from [employer]. Please contact [facility] at [phone] with questions or concerns regarding your invoice.   Our billing staff will not be able to assist you with questions regarding bills from these companies.  You will be contacted with the lab results as soon as they are available. The fastest way to get your results is to activate your My Chart account. Instructions are located on the last page of this paperwork. If you have not heard from [place] regarding the results in 2 weeks, please contact this office.    Your x-ray did not indicate any infiltrate or pneumonia, your blood count was normal. 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 However with the mucus production, there is a possibility of secondary infection.   Start doxycycline to cover for infection, but for wheezing and COPD/pneumonitis flare, increase prednisone back up to 20 mg each day for the next 5 days. I sent this prescription to your pharmacy. After 5 days, return to your 10 mg dose taper as prescribed by oncology.   Albuterol either by inhaler or nebulizer up to every 4-6 hours as needed this weekend, and recheck in 3 days. If you have increased need for albuterol prior to 4 hours, worsening shortness of breath or any fevers or other worsening symptoms, return here or the emergency room.  Pneumonitis Pneumonitis is inflammation of the lungs.  CAUSES  Many things can cause pneumonitis. These can include:   A bacterial or viral infection. Pneumonitis due to an infection is usually called pneumonia.  Work-related exposures, including farm and industrial work. Some  substances that can cause pneumonitis include asbestos, silica, inhaled acids, or inhaled chlorine gas.   Repeated exposure to bird feathers, bird feces, or other allergens.   Medicine such as chemotherapy drugs, certain antibiotics, and some heart medicines.   Radiation therapy.   Exposure to mold. A hot tub, sauna, or home humidifier can have mold growing in it, even if it looks clean. The mold can be breathed in through water vapor.  Breathing (aspirating) stomach contents, food, or liquids into the lungs.  SIGNS AND SYMPTOMS   Cough.   Shortness of breath or difficulty breathing.   Fever.   Decreased energy.   Decreased appetite.  DIAGNOSIS  To diagnose pneumonitis, your health care provider will do a complete history and physical exam. Various tests may be ordered, such as:   Pulmonary function test.   Chest X-ray.   CT scan of the lungs.   Bronchoscopy.   Lung biopsy.  TREATMENT  Treatment will depend on the cause of the pneumonitis. If the cause is exposure to a substance, avoiding further exposure to that substance will help reduce your symptoms. Possible medical treatments for pneumonitis include:   Corticosteroid medicine to help decrease inflammation in the lungs.   Antibiotic medicine to help fight a bacterial lung infection.   Oxygen therapy if you are having difficulty breathing.  HOME CARE INSTRUCTIONS   Avoid exposure to any substance identified as the cause of your pneumonitis.   If you must continue to work with substances that can cause pneumonitis, wear a mask to protect your lungs.   Only take over-the-counter  or prescription medicine as directed by your health care provider.   Do not smoke.   If you use inhalers, keep them with you at all times.   Follow up with your health care provider as directed.  SEEK IMMEDIATE MEDICAL CARE IF:   You develop new or increased shortness of breath.   You develop a blue color  (cyanosis) under your fingernails.   You have a fever.  MAKE SURE YOU:   Understand these instructions.  Will watch your condition.  Will get help right away if you are not doing well or get worse.   This information is not intended to replace advice given to you by your health care provider. Make sure you discuss any questions you have with your health care provider.   Document Released: 04/18/2010 Document Revised: 07/01/2013 Document Reviewed: 04/20/2013 Elsevier Interactive Patient Education 2016 [employer] Chronic Obstructive Pulmonary Disease Chronic obstructive pulmonary disease (COPD) is a common lung condition in which airflow from the lungs is limited. COPD is a general term that can be used to describe many different lung problems that limit airflow, including both chronic bronchitis and emphysema. If you have COPD, your lung function will probably never return to normal, but there are measures you can take to improve lung function and make yourself feel better. CAUSES   Smoking (common).  Exposure to secondhand smoke.  Genetic problems.  Chronic inflammatory lung diseases or recurrent infections. SYMPTOMS  Shortness of breath, especially with physical activity.  Deep, persistent (chronic) cough with a large amount of thick mucus.  Wheezing.  Rapid breaths (tachypnea).  Gray or bluish discoloration (cyanosis) of the skin, especially in your fingers, toes, or lips.  Fatigue.  Weight loss.  Frequent infections or episodes when breathing symptoms become much worse (exacerbations).  Chest tightness. DIAGNOSIS Your health care provider will take a medical history and perform a physical examination to diagnose COPD. Additional tests for COPD may include:  Lung (pulmonary) function tests.  Chest X-ray.  CT scan.  Blood tests. TREATMENT  Treatment for COPD may include:  Inhaler and nebulizer medicines. These help manage the symptoms of COPD and make  your breathing more comfortable.  Supplemental oxygen. Supplemental oxygen is only helpful if you have a low oxygen level in your blood.  Exercise and physical activity. These are beneficial for nearly all people with COPD.  Lung surgery or transplant.  Nutrition therapy to gain weight, if you are underweight.  Pulmonary rehabilitation. This may involve working with a team of health care providers and specialists, such as respiratory, occupational, and physical therapists. HOME CARE INSTRUCTIONS  Take all medicines (inhaled or pills) as directed by your health care provider.  Avoid over-the-counter medicines or cough syrups that dry up your airway (such as antihistamines) and slow down the elimination of secretions unless instructed otherwise by your health care provider.  If you are a smoker, the most important thing that you can do is stop smoking. Continuing to smoke will cause further lung damage and breathing trouble. Ask your health care provider for help with quitting smoking. He or she can direct you to community resources or hospitals that provide support.  Avoid exposure to irritants such as smoke, chemicals, and fumes that aggravate your breathing.  Use oxygen therapy and pulmonary rehabilitation if directed by your health care provider. If you require home oxygen therapy, ask your health care provider whether you should purchase a pulse oximeter to measure your oxygen level at home.  Avoid contact  with individuals who have a contagious illness.  Avoid extreme temperature and humidity changes.  Eat healthy foods. Eating smaller, more frequent meals and resting before meals may help you maintain your strength.  Stay active, but balance activity with periods of rest. Exercise and physical activity will help you maintain your ability to do things you want to do.  Preventing infection and hospitalization is very important when you have COPD. Make sure to receive all the vaccines  your health care provider recommends, especially the pneumococcal and influenza vaccines. Ask your health care provider whether you need a pneumonia vaccine.  Learn and use relaxation techniques to manage stress.  Learn and use controlled breathing techniques as directed by your health care provider. Controlled breathing techniques include:  Pursed lip breathing. Start by breathing in (inhaling) through your nose for 1 second. Then, purse your lips as if you were going to whistle and breathe out (exhale) through the pursed lips for 2 seconds.  Diaphragmatic breathing. Start by putting one hand on your abdomen just above your waist. Inhale slowly through your nose. The hand on your abdomen should move out. Then purse your lips and exhale slowly. You should be able to feel the hand on your abdomen moving in as you exhale.  Learn and use controlled coughing to clear mucus from your lungs. Controlled coughing is a series of short, progressive coughs. The steps of controlled coughing are: 1. Lean your head slightly forward. 2. Breathe in deeply using diaphragmatic breathing. 3. Try to hold your breath for 3 seconds. 4. Keep your mouth slightly open while coughing twice. 5. Spit any mucus out into a tissue. 6. Rest and repeat the steps once or twice as needed. SEEK MEDICAL CARE IF:  You are coughing up more mucus than usual.  There is a change in the color or thickness of your mucus.  Your breathing is more labored than usual.  Your breathing is faster than usual. SEEK IMMEDIATE MEDICAL CARE IF:  You have shortness of breath while you are resting.  You have shortness of breath that prevents you from:  Being able to talk.  Performing your usual physical activities.  You have chest pain lasting longer than 5 minutes.  Your skin color is more cyanotic than usual.  You measure low oxygen saturations for longer than 5 minutes with a pulse oximeter. MAKE SURE YOU:  Understand these  instructions.  Will watch your condition.  Will get help right away if you are not doing well or get worse.   This information is not intended to replace advice given to you by your health care provider. Make sure you discuss any questions you have with your health care provider.   Document Released: 08/08/2005 Document Revised: 11/19/2014 Document Reviewed: 06/25/2013 Elsevier Interactive Patient Education [employer].

## 2016-07-20 NOTE — Progress Notes (Addendum)
Subjective:  By signing my name below, I, Moises Blood, attest that this documentation has been prepared under the direction and in the presence of Merri Ray, MD. Electronically Signed: Moises Blood, [place]. 07/20/2016 , 9:15 AM .  Patient was seen in Room 1 .   Patient ID: Tammy Ball, female    DOB: March 05, 1941, 75 y.o.   MRN: [id number] Chief Complaint  Patient presents with  . Cough    PER PATIENT STARTED 2 DAYS AGO  . Sore Throat   HPI Tammy Ball is a 75 y.o. female H/o multiple medical problems including COPD, lung cancer with chemotherapy/ radiation, and lobectomy, HTN and glaucoma. Patient states she's been having productive cough (thick yellow mucus) and sore throat with wheezing that started 2 days ago. She has a rescue inhaler but denies using it. She's used a breathing treatment last night and this morning at 7:00AM today. She reports the breathing treatment helped, but symptoms returned an hour after.   She also notes having intermittent fever (tmax 99.5). She's taken tylenol with water for temporary relief for her fever. She informs having pneumonitis on Aug 11th with inflammation but no infection. She's given prednisone taper through cancer center. She's currently on prednisone '10mg'$ . She also mentions her colleague has similar symptoms. Recent office visit note from oncology was reviewed prior to visit today.  She was last seen by oncology on 8/23 and has been on augmentin for 8 days with minimal improvement for her cough. She was diagnosed with radiation pneumonitis, chest xray showed progressive radiation changes with no infiltrate or effusion. She was started on medrol dose pak and then possible tapering dose of prednisone. She was also treated with hycodan cough syrup.   Patient Active Problem List   Diagnosis Date Noted  . Dehydration 06/25/2016  . Hypercalcemia 06/25/2016  . Insomnia 05/16/2016  . Pneumonitis, radiation ([place]) 04/11/2016  . S/P lobectomy  of lung 01/18/2016  . Chemotherapy-induced neuropathy ([place]) 11/01/2015  . Non-small cell carcinoma of right lung, stage 2 ([place]) 10/03/2015  . Morbid obesity ([place]) 10/01/2015  . Hyperlipidemia 08/14/2013  . Spondylolisthesis of lumbar region 07/03/2013  . Glaucoma 10/23/2012  . Essential hypertension 12/17/2011  . COPD GOLD II  12/17/2011  . Hearing loss 12/17/2011  . Renal cell cancer ([place]) 12/17/2011  . Sciatica of left side 12/17/2011  . Depression 12/17/2011   Past Medical History:  Diagnosis Date  . Anemia    was while doing chemo  . Anxiety   . Asthma   . Chemotherapy-induced neuropathy ([place]) 11/01/2015  . Claustrophobia   . COPD (chronic obstructive pulmonary disease) ([place])   . Depression   . Encounter for antineoplastic chemotherapy 02/09/2016  . Glaucoma   . Headache    prior to menopause  . Heart murmur   . History of echocardiogram    Echo 2/17: EF 67-34%, grade 1 diastolic dysfunction, mild MR, trivial pericardial effusion  . History of nuclear stress test    Myoview 2/17: no ischemia or scar, EF 79%; low risk  . Hyperlipidemia   . Hypertension   . Insomnia 05/16/2016  . Non-small cell carcinoma of right lung, stage 2 ([place]) 10/03/2015  . Radiation 02/29/16-04/10/16   50.4 Gy to right central chest  . Renal cell carcinoma ([place])    L nephrectomy  in 2012   Past Surgical History:  Procedure Laterality Date  . BACK SURGERY     cervical 1991  . EYE SURGERY    . kidney  cancer    . NEPHRECTOMY    . SPINE SURGERY    . TUBAL LIGATION    . VIDEO ASSISTED THORACOSCOPY (VATS)/WEDGE RESECTION Right 01/18/2016   Procedure: VIDEO ASSISTED THORACOSCOPY (VATS)/LUNG RESECTION, THOROCOTOMY, RIGHT UPPER LOBECTOMY, LYMPH NODE DISSECTION, PLACEMENT OF ON Q;  Surgeon: [name], MD;  Location: [facility];  Service: Thoracic;  Laterality: Right;  Marland Kitchen VIDEO BRONCHOSCOPY Bilateral 09/20/2015   Procedure: VIDEO BRONCHOSCOPY WITHOUT FLUORO;  Surgeon: Rigoberto Noel, MD;  Location: [facility]  ENDOSCOPY;  Service: Cardiopulmonary;  Laterality: Bilateral;  . VIDEO BRONCHOSCOPY N/A 01/18/2016   Procedure: VIDEO BRONCHOSCOPY;  Surgeon: [name], MD;  Location: [facility];  Service: Thoracic;  Laterality: N/A;   Allergies  Allergen Reactions  . Bee Venom Anaphylaxis, Shortness Of Breath, Swelling and Other (See Comments)    Swelling at site   . Amlodipine Swelling and Other (See Comments)    Swelling of the ankles and hands   . Levofloxacin Other (See Comments)    Joint pain   . Hctz [Hydrochlorothiazide] Palpitations and Other (See Comments)    Sweating    Prior to Admission medications   Medication Sig Start Date End Date Taking? Authorizing Provider  acetaminophen (TYLENOL) 500 MG tablet Take 1,000 mg by mouth every 6 (six) hours as needed for mild pain, moderate pain, fever or headache. Reported on 05/24/2016    Historical Provider, MD  albuterol (PROVENTIL HFA;VENTOLIN HFA) 108 (90 BASE) MCG/ACT inhaler Inhale 2 puffs into the lungs every 4 (four) hours as needed for wheezing or shortness of breath. 05/18/15 09/03/16  [name], MD  aspirin EC 81 MG tablet Take 81 mg by mouth daily. Reported on 04/11/2016    Historical Provider, MD  atorvastatin (LIPITOR) 40 MG tablet TAKE ONE TABLET BY MOUTH ONCE DAILY. 12/10/15   Leandrew Koyanagi, MD  calcium carbonate (OSCAL) 1500 (600 Ca) MG TABS tablet Take by mouth 2 (two) times daily with a meal.    Historical Provider, MD  fish oil-omega-3 fatty acids 1000 MG capsule Take 1 capsule by mouth daily.     Historical Provider, MD  fluticasone (FLONASE) 50 MCG/ACT nasal spray Place 2 sprays into both nostrils daily.    Historical Provider, MD  HYDROcodone-homatropine (HYCODAN) 5-1.5 MG/5ML syrup Take 5 mLs by mouth every 6 (six) hours as needed for cough. 06/25/16   Susanne Borders, NP  LORazepam (ATIVAN) 0.5 MG tablet Take 1 tablet (0.5 mg total) by mouth 2 (two) times daily as needed for anxiety. 10/05/15   [name], MD    methylPREDNISolone (MEDROL DOSEPAK) 4 MG TBPK tablet Medrol dose pak: take as directed. Patient not taking: Reported on 07/04/2016 06/25/16   Susanne Borders, NP  polyvinyl alcohol-povidone (REFRESH) 1.4-0.6 % ophthalmic solution Place 2 drops into both eyes daily as needed (for dry eyes). Reported on 05/24/2016    Historical Provider, MD  predniSONE (DELTASONE) 10 MG tablet Take 2 tabs (20 mg) PO Q AM x 14 days; then 1 tab (10 mg) PO Q AM x 14 days; then 1/2 tab (5 mg) PO Q AM x 1 week; then take 1/2 tab (5 mg) PO Q AM every other day till gone. 07/04/16   Susanne Borders, NP  pseudoephedrine-guaifenesin (MUCINEX D) 60-600 MG 12 hr tablet Take 1 tablet by mouth every 12 (twelve) hours. Reported on 05/24/2016    Historical Provider, MD  temazepam (RESTORIL) 15 MG capsule Take 1 capsule (15 mg total) by mouth at bedtime as  needed for sleep. 05/24/16   Curt Bears, MD  traMADol (ULTRAM) 50 MG tablet Take 50 mg by mouth every 6 (six) hours as needed. Reported on 05/24/2016    Historical Provider, MD  [place] Name: Rayburn Felt 11/01/15   Curt Bears, MD  valsartan (DIOVAN) 80 MG tablet Take 1 tablet (80 mg total) by mouth daily. 02/15/16   [name], MD   Social History   Social History  . Marital status: Widowed    Spouse name: N/A  . Number of children: N/A  . Years of education: N/A   Occupational History  . Not on file.   Social History Main Topics  . Smoking status: Former Smoker    Packs/day: 0.50    Years: 50.00    Types: Cigarettes    Quit date: 03/16/2011  . Smokeless tobacco: Never Used  . Alcohol use No  . Drug use: No  . Sexual activity: Not Currently   Other Topics Concern  . Not on file   Social History Narrative   Occupation: Substance Abuse Counselor   Divorced   College Education   No exercise** Merged History Encounter **       ** Data from: 12/14/11 Enc Dept: [facility]       ** Data from: 12/17/11 Enc Dept: [facility]   Substance  abuse counselor   Husband deceased   4 great grandchildren   Son works in same substance abuse counseling center as patient   Review of Systems  Constitutional: Positive for chills, fatigue and fever. Negative for diaphoresis.  HENT: Positive for congestion and sore throat.   Respiratory: Positive for cough and wheezing. Negative for chest tightness and shortness of breath.   Cardiovascular: Negative for chest pain.  Gastrointestinal: Negative for diarrhea, nausea and vomiting.       Objective:   Physical Exam  Constitutional: She is oriented to person, place, and time. She appears well-developed and well-nourished. No distress.  HENT:  Head: Normocephalic and atraumatic.  Right Ear: Hearing, tympanic membrane, external ear and ear canal normal.  Left Ear: Hearing, tympanic membrane, external ear and ear canal normal.  Nose: Nose normal.  Mouth/Throat: Oropharynx is clear and moist. No oropharyngeal exudate, posterior oropharyngeal edema or posterior oropharyngeal erythema.  Eyes: Conjunctivae and EOM are normal. Pupils are equal, round, and reactive to light.  Cardiovascular: Normal rate, regular rhythm, normal heart sounds and intact distal pulses.   No murmur heard. Pulmonary/Chest: Effort normal. No respiratory distress. She has no decreased breath sounds. She has wheezes (inspiratory and expiratory). She has no rhonchi.  Neurological: She is alert and oriented to person, place, and time.  Skin: Skin is warm and dry. No rash noted.  Psychiatric: She has a normal mood and affect. Her behavior is normal.  Vitals reviewed.   Vitals:   07/20/16 0853  BP: (!) 158/78  Pulse: (!) 107  Resp: 18  Temp: 98.7 F (37.1 C)  TempSrc: Oral  SpO2: 96%  Weight: 169 lb 6.4 oz (76.8 kg)  Height: '5\' 3"'$  (1.6 m)   Results for orders placed or performed in visit on 07/20/16  POCT CBC  Result Value Ref Range   WBC 8.2 4.6 - 10.2 K/uL   Lymph, poc 1.1 0.6 - 3.4   POC LYMPH PERCENT 13.2  10 - 50 %L   MID (cbc) 0.6 0 - 0.9   POC MID % 7.9 0 - 12 %M   POC Granulocyte  6.5 2 - 6.9   Granulocyte percent 78.9 37 - 80 %G   RBC 3.62 (A) 4.04 - 5.48 M/uL   Hemoglobin 11.3 (A) 12.2 - 16.2 g/dL   HCT, POC 33.5 (A) 37.7 - 47.9 %   MCV 92.3 80 - 97 fL   MCH, POC 31.2 27 - 31.2 pg   MCHC 33.8 31.8 - 35.4 g/dL   RDW, POC 18.5 %   Platelet Count, POC 328 142 - 424 K/uL   MPV 6.4 0 - 99.8 fL   Dg Chest 2 View  Result Date: 07/20/2016 CLINICAL DATA:  Previous history of lung cancer and pneumonia. Cough and wheezing over the last several days. EXAM: CHEST  2 VIEW COMPARISON:  Radiography 06/22/2016.  CT 06/22/2016. FINDINGS: Heart size is normal. Aortic atherosclerosis. There is chronic scarring in the right hilar and lower chest region related to previous surgery and radiation. No sign of active infiltrate, mass, effusion or collapse. IMPRESSION: Chronic scarring and radiation change on the right. No change since previous studies. No active disease. Electronically Signed   By: Nelson Chimes M.D.   On: 07/20/2016 09:47       Assessment & Plan:   Tammy Ball is a 75 y.o. female Cough - Plan: POCT CBC, DG Chest 2 View, albuterol (PROVENTIL) (2.5 MG/3ML) 0.083% nebulizer solution 2.5 mg, ipratropium (ATROVENT) nebulizer solution 0.5 mg, doxycycline (VIBRA-TABS) 100 MG tablet  Wheezing - Plan: POCT CBC, DG Chest 2 View, albuterol (PROVENTIL) (2.5 MG/3ML) 0.083% nebulizer solution 2.5 mg, ipratropium (ATROVENT) nebulizer solution 0.5 mg  Radiation pneumonitis (HCC) - Plan: predniSONE (DELTASONE) 20 MG tablet  COPD exacerbation (HCC) - Plan: predniSONE (DELTASONE) 20 MG tablet, doxycycline (VIBRA-TABS) 100 MG tablet  Non-small cell carcinoma of right lung, stage 2 (HCC)   History of non-small cell carcinoma of right lung status post chemotherapy and radiation. Recent radiation pneumonitis, with improvement initially after multiple steroid courses and now on taper, recently decreased to 10  mg each day. Now with three-day history of suspected viral syndrome symptoms with sick contacts, triggering COPD exacerbation with discolored productive mucus. Chest x-ray without focal infiltrate, reassuring CBC, even on prednisone. Based on severity of symptoms with wheezing in office after nebulizer home treatment earlier today, albuterol and atrovent nebulizer treatments were given in office. Improvement in aeration after albuterol and Atrovent neb in office. Suspected combination of COPD exacerbation and underlying radiation pneumonitis. Based on exam after treatments, appears to be stable for outpatient treatment at this time with close follow-up  -Start doxycycline 100 mg twice a day has new productive mucus with COPD exacerbation  - increase prednisone to 20 mg daily for 5 days, then return to 10 mg and further taper. If not improving in the next few days, may need to increase to 40 mg.  - continue albuterol by inhaler or nebulizer solution at home up to every 4 hours for the next few days and recheck in 3 days. If increased frequency of nebulizer treatments needed, or any worsening symptoms, return here or the emergency room sooner.  Meds ordered this encounter  Medications  . albuterol (PROVENTIL) (2.5 MG/3ML) 0.083% nebulizer solution 2.5 mg  . ipratropium (ATROVENT) nebulizer solution 0.5 mg  . predniSONE (DELTASONE) 20 MG tablet    Sig: Take 1 tablet (20 mg total) by mouth daily with breakfast.    Dispense:  5 tablet    Refill:  0  . doxycycline (VIBRA-TABS) 100 MG tablet    Sig: Take 1 tablet (  100 mg total) by mouth 2 (two) times daily.    Dispense:  20 tablet    Refill:  0   Patient Instructions       IF you received an x-ray today, you will receive an invoice from [facility]. Please contact [facility] at [phone] with questions or concerns regarding your invoice.   IF you received labwork today, you will receive an invoice from [employer]. Please contact [facility] at [phone] with questions or concerns regarding your invoice.   Our billing staff will not be able to assist you with questions regarding bills from these companies.  You will be contacted with the lab results as soon as they are available. The fastest way to get your results is to activate your My Chart account. Instructions are located on the last page of this paperwork. If you have not heard from [place] regarding the results in 2 weeks, please contact this office.    Your x-ray did not indicate any infiltrate or pneumonia, your blood count was normal. 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 However with the mucus production, there is a possibility of secondary infection.   Start doxycycline to cover for infection, but for wheezing and COPD/pneumonitis flare, increase prednisone back up to 20 mg each day for the next 5 days. I sent this prescription to your pharmacy. After 5 days, return to your 10 mg dose taper as prescribed by oncology.   Albuterol either by inhaler or nebulizer up to every 4-6 hours as needed this weekend, and recheck in 3 days. If you have increased need for albuterol prior to 4 hours, worsening shortness of breath or any fevers or other worsening symptoms, return here or the emergency room.  Pneumonitis Pneumonitis is inflammation of the lungs.  CAUSES  Many things can cause pneumonitis. These can include:   A bacterial or viral infection. Pneumonitis due to an infection is usually called pneumonia.  Work-related exposures, including farm and industrial work. Some substances that can cause pneumonitis include asbestos, silica, inhaled acids, or inhaled chlorine gas.   Repeated exposure to bird feathers, bird feces, or other allergens.   Medicine such as chemotherapy drugs, certain antibiotics, and some heart medicines.   Radiation  therapy.   Exposure to mold. A hot tub, sauna, or home humidifier can have mold growing in it, even if it looks clean. The mold can be breathed in through water vapor.  Breathing (aspirating) stomach contents, food, or liquids into the lungs.  SIGNS AND SYMPTOMS   Cough.   Shortness of breath or difficulty breathing.   Fever.   Decreased energy.   Decreased appetite.  DIAGNOSIS  To diagnose pneumonitis, your health care provider will do a complete history and physical exam. Various tests may be ordered, such as:   Pulmonary function test.   Chest X-ray.   CT scan of the lungs.   Bronchoscopy.   Lung biopsy.  TREATMENT  Treatment will depend on the cause of the pneumonitis. If the cause is exposure to a substance, avoiding further exposure to that substance will help reduce your symptoms. Possible medical treatments for pneumonitis include:   Corticosteroid medicine to help decrease inflammation in the lungs.   Antibiotic medicine to help fight a bacterial lung infection.   Oxygen therapy if you are having difficulty breathing.  HOME CARE INSTRUCTIONS   Avoid exposure to any substance identified as the  cause of your pneumonitis.   If you must continue to work with substances that can cause pneumonitis, wear a mask to protect your lungs.   Only take over-the-counter or prescription medicine as directed by your health care provider.   Do not smoke.   If you use inhalers, keep them with you at all times.   Follow up with your health care provider as directed.  SEEK IMMEDIATE MEDICAL CARE IF:   You develop new or increased shortness of breath.   You develop a blue color (cyanosis) under your fingernails.   You have a fever.  MAKE SURE YOU:   Understand these instructions.  Will watch your condition.  Will get help right away if you are not doing well or get worse.   This information is not intended to replace advice given to you by  your health care provider. Make sure you discuss any questions you have with your health care provider.   Document Released: 04/18/2010 Document Revised: 07/01/2013 Document Reviewed: 04/20/2013 Elsevier Interactive Patient Education 2016 Elsevier Inc. Chronic Obstructive Pulmonary Disease Chronic obstructive pulmonary disease (COPD) is a common lung condition in which airflow from the lungs is limited. COPD is a general term that can be used to describe many different lung problems that limit airflow, including both chronic bronchitis and emphysema. If you have COPD, your lung function will probably never return to normal, but there are measures you can take to improve lung function and make yourself feel better. CAUSES   Smoking (common).  Exposure to secondhand smoke.  Genetic problems.  Chronic inflammatory lung diseases or recurrent infections. SYMPTOMS  Shortness of breath, especially with physical activity.  Deep, persistent (chronic) cough with a large amount of thick mucus.  Wheezing.  Rapid breaths (tachypnea).  Gray or bluish discoloration (cyanosis) of the skin, especially in your fingers, toes, or lips.  Fatigue.  Weight loss.  Frequent infections or episodes when breathing symptoms become much worse (exacerbations).  Chest tightness. DIAGNOSIS Your health care provider will take a medical history and perform a physical examination to diagnose COPD. Additional tests for COPD may include:  Lung (pulmonary) function tests.  Chest X-ray.  CT scan.  Blood tests. TREATMENT  Treatment for COPD may include:  Inhaler and nebulizer medicines. These help manage the symptoms of COPD and make your breathing more comfortable.  Supplemental oxygen. Supplemental oxygen is only helpful if you have a low oxygen level in your blood.  Exercise and physical activity. These are beneficial for nearly all people with COPD.  Lung surgery or transplant.  Nutrition therapy  to gain weight, if you are underweight.  Pulmonary rehabilitation. This may involve working with a team of health care providers and specialists, such as respiratory, occupational, and physical therapists. HOME CARE INSTRUCTIONS  Take all medicines (inhaled or pills) as directed by your health care provider.  Avoid over-the-counter medicines or cough syrups that dry up your airway (such as antihistamines) and slow down the elimination of secretions unless instructed otherwise by your health care provider.  If you are a smoker, the most important thing that you can do is stop smoking. Continuing to smoke will cause further lung damage and breathing trouble. Ask your health care provider for help with quitting smoking. He or she can direct you to community resources or hospitals that provide support.  Avoid exposure to irritants such as smoke, chemicals, and fumes that aggravate your breathing.  Use oxygen therapy and pulmonary rehabilitation if directed by your health  care provider. If you require home oxygen therapy, ask your health care provider whether you should purchase a pulse oximeter to measure your oxygen level at home.  Avoid contact with individuals who have a contagious illness.  Avoid extreme temperature and humidity changes.  Eat healthy foods. Eating smaller, more frequent meals and resting before meals may help you maintain your strength.  Stay active, but balance activity with periods of rest. Exercise and physical activity will help you maintain your ability to do things you want to do.  Preventing infection and hospitalization is very important when you have COPD. Make sure to receive all the vaccines your health care provider recommends, especially the pneumococcal and influenza vaccines. Ask your health care provider whether you need a pneumonia vaccine.  Learn and use relaxation techniques to manage stress.  Learn and use controlled breathing techniques as directed by  your health care provider. Controlled breathing techniques include:  Pursed lip breathing. Start by breathing in (inhaling) through your nose for 1 second. Then, purse your lips as if you were going to whistle and breathe out (exhale) through the pursed lips for 2 seconds.  Diaphragmatic breathing. Start by putting one hand on your abdomen just above your waist. Inhale slowly through your nose. The hand on your abdomen should move out. Then purse your lips and exhale slowly. You should be able to feel the hand on your abdomen moving in as you exhale.  Learn and use controlled coughing to clear mucus from your lungs. Controlled coughing is a series of short, progressive coughs. The steps of controlled coughing are: 1. Lean your head slightly forward. 2. Breathe in deeply using diaphragmatic breathing. 3. Try to hold your breath for 3 seconds. 4. Keep your mouth slightly open while coughing twice. 5. Spit any mucus out into a tissue. 6. Rest and repeat the steps once or twice as needed. SEEK MEDICAL CARE IF:  You are coughing up more mucus than usual.  There is a change in the color or thickness of your mucus.  Your breathing is more labored than usual.  Your breathing is faster than usual. SEEK IMMEDIATE MEDICAL CARE IF:  You have shortness of breath while you are resting.  You have shortness of breath that prevents you from:  Being able to talk.  Performing your usual physical activities.  You have chest pain lasting longer than 5 minutes.  Your skin color is more cyanotic than usual.  You measure low oxygen saturations for longer than 5 minutes with a pulse oximeter. MAKE SURE YOU:  Understand these instructions.  Will watch your condition.  Will get help right away if you are not doing well or get worse.   This information is not intended to replace advice given to you by your health care provider. Make sure you discuss any questions you have with your health care  provider.   Document Released: 08/08/2005 Document Revised: 11/19/2014 Document Reviewed: 06/25/2013 Elsevier Interactive Patient Education [employer].    I personally performed the services described in this documentation, which was scribed in my presence. The recorded information has been reviewed and considered, and addended by me as needed.   Signed,   Merri Ray, MD [facility].  07/20/16 12:12 PM

## 2016-07-23 ENCOUNTER — Telehealth: Payer: Self-pay | Admitting: Family Medicine

## 2016-07-23 ENCOUNTER — Telehealth: Payer: Self-pay

## 2016-07-23 NOTE — Telephone Encounter (Signed)
Didn't pick up and Couldn't leave message on machine

## 2016-07-23 NOTE — Telephone Encounter (Signed)
Patient is returning phone call. Not sure the reason for the call.      [phone]

## 2016-07-24 ENCOUNTER — Encounter: Payer: Self-pay | Admitting: Family Medicine

## 2016-07-24 NOTE — Telephone Encounter (Signed)
Angie were you going to call pt to reschedule?

## 2016-07-30 ENCOUNTER — Emergency Department (HOSPITAL_COMMUNITY): Payer: Medicare Other

## 2016-07-30 ENCOUNTER — Emergency Department (HOSPITAL_COMMUNITY)
Admission: EM | Admit: 2016-07-30 | Discharge: 2016-07-30 | Disposition: A | Discharge status: Home / Self Care | Payer: Medicare Other | Attending: Emergency Medicine | Admitting: Emergency Medicine

## 2016-07-30 DIAGNOSIS — R0789 Other chest pain: Secondary | ICD-10-CM

## 2016-07-30 DIAGNOSIS — J449 Chronic obstructive pulmonary disease, unspecified: Secondary | ICD-10-CM | POA: Insufficient documentation

## 2016-07-30 DIAGNOSIS — Z7982 Long term (current) use of aspirin: Secondary | ICD-10-CM | POA: Insufficient documentation

## 2016-07-30 DIAGNOSIS — Z7951 Long term (current) use of inhaled steroids: Secondary | ICD-10-CM | POA: Insufficient documentation

## 2016-07-30 DIAGNOSIS — Z87891 Personal history of nicotine dependence: Secondary | ICD-10-CM | POA: Insufficient documentation

## 2016-07-30 DIAGNOSIS — I10 Essential (primary) hypertension: Secondary | ICD-10-CM | POA: Insufficient documentation

## 2016-07-30 DIAGNOSIS — J45909 Unspecified asthma, uncomplicated: Secondary | ICD-10-CM | POA: Insufficient documentation

## 2016-07-30 DIAGNOSIS — N289 Disorder of kidney and ureter, unspecified: Secondary | ICD-10-CM | POA: Insufficient documentation

## 2016-07-30 DIAGNOSIS — M549 Dorsalgia, unspecified: Secondary | ICD-10-CM | POA: Diagnosis not present

## 2016-07-30 DIAGNOSIS — R079 Chest pain, unspecified: Secondary | ICD-10-CM | POA: Diagnosis not present

## 2016-07-30 DIAGNOSIS — Z79899 Other long term (current) drug therapy: Secondary | ICD-10-CM | POA: Insufficient documentation

## 2016-07-30 DIAGNOSIS — R52 Pain, unspecified: Secondary | ICD-10-CM

## 2016-07-30 DIAGNOSIS — R05 Cough: Secondary | ICD-10-CM | POA: Diagnosis not present

## 2016-07-30 DIAGNOSIS — R0602 Shortness of breath: Secondary | ICD-10-CM | POA: Diagnosis present

## 2016-07-30 DIAGNOSIS — R0781 Pleurodynia: Secondary | ICD-10-CM | POA: Diagnosis not present

## 2016-07-30 LAB — CBC WITH DIFFERENTIAL/PLATELET
Basophils Absolute: 0 10*3/uL (ref 0.0–0.1)
Basophils Relative: 0 %
Eosinophils Absolute: 0.1 10*3/uL (ref 0.0–0.7)
Eosinophils Relative: 1 %
HCT: 35.1 % — ABNORMAL LOW (ref 36.0–46.0)
Hemoglobin: 11 g/dL — ABNORMAL LOW (ref 12.0–15.0)
Lymphocytes Relative: 23 %
Lymphs Abs: 1.7 10*3/uL (ref 0.7–4.0)
MCH: 29.9 pg (ref 26.0–34.0)
MCHC: 31.3 g/dL (ref 30.0–36.0)
MCV: 95.4 fL (ref 78.0–100.0)
Monocytes Absolute: 0.6 10*3/uL (ref 0.1–1.0)
Monocytes Relative: 8 %
Neutro Abs: 4.9 10*3/uL (ref 1.7–7.7)
Neutrophils Relative %: 68 %
Platelets: 306 10*3/uL (ref 150–400)
RBC: 3.68 MIL/uL — ABNORMAL LOW (ref 3.87–5.11)
RDW: 17.5 % — ABNORMAL HIGH (ref 11.5–15.5)
WBC: 7.3 10*3/uL (ref 4.0–10.5)

## 2016-07-30 LAB — BASIC METABOLIC PANEL
Anion gap: 7 (ref 5–15)
BUN: 40 mg/dL — ABNORMAL HIGH (ref 6–20)
CO2: 28 mmol/L (ref 22–32)
Calcium: 9.2 mg/dL (ref 8.9–10.3)
Chloride: 103 mmol/L (ref 101–111)
Creatinine, Ser: 1.4 mg/dL — ABNORMAL HIGH (ref 0.44–1.00)
GFR calc Af Amer: 42 mL/min — ABNORMAL LOW (ref >60)
GFR calc non Af Amer: 37 mL/min — ABNORMAL LOW (ref >60)
Glucose, Bld: 110 mg/dL — ABNORMAL HIGH (ref 65–99)
Potassium: 4.7 mmol/L (ref 3.5–5.1)
Sodium: 138 mmol/L (ref 135–145)

## 2016-07-30 LAB — I-STAT CG4 LACTIC ACID, ED: Lactic Acid, Venous: 0.88 mmol/L (ref 0.5–1.9)

## 2016-07-30 MED ORDER — OXYCODONE-ACETAMINOPHEN 5-325 MG PO TABS: 1.0000 | ORAL_TABLET | ORAL | 0 refills | Status: DC | PRN

## 2016-07-30 MED ORDER — OXYCODONE-ACETAMINOPHEN 5-325 MG PO TABS
1.0000 | ORAL_TABLET | Freq: Once | ORAL | Status: AC
  Administered 2016-07-30: 1 via ORAL
  Filled 2016-07-30: qty 1

## 2016-07-30 NOTE — ED Provider Notes (Signed)
[facility] DEPT Provider Note   CSN: [id number] Arrival date & time: 07/30/16  1758     History   Chief Complaint Chief Complaint  Patient presents with  . Shortness of Breath    HPI Tammy Ball is a 75 y.o. female.  She presents for evaluation of left-sided chest pain which started yesterday, and improved after taking tramadol, and ibuprofen. She was able to sleep last night and today went to work. After work. she laid down and developed, suddenly, left mid back pain. The pain is felt in the left posterior chest region. The pain is worse with deep breathing, movement, and lying down. She denies anterior chest pain. She denies shortness of breath. She has had ongoing cough, for several weeks, occasional productive of sputum. She is being treated for radiation pneumonitis, with a prednisone taper, now in week 3-4 out of 6. She denies nausea, vomiting, weakness, dizziness, or diarrhea. There are no other known modifying factors.  HPI  Past Medical History:  Diagnosis Date  . Anemia    was while doing chemo  . Anxiety   . Asthma   . Chemotherapy-induced neuropathy ([place]) 11/01/2015  . Claustrophobia   . COPD (chronic obstructive pulmonary disease) ([place])   . Depression   . Encounter for antineoplastic chemotherapy 02/09/2016  . Glaucoma   . Headache    prior to menopause  . Heart murmur   . History of echocardiogram    Echo 2/17: EF 01-60%, grade 1 diastolic dysfunction, mild MR, trivial pericardial effusion  . History of nuclear stress test    Myoview 2/17: no ischemia or scar, EF 79%; low risk  . Hyperlipidemia   . Hypertension   . Insomnia 05/16/2016  . Non-small cell carcinoma of right lung, stage 2 ([place]) 10/03/2015  . Radiation 02/29/16-04/10/16   50.4 Gy to right central chest  . Renal cell carcinoma ([place])    L nephrectomy  in 2012    Patient Active Problem List   Diagnosis Date Noted  . Dehydration 06/25/2016  . Hypercalcemia 06/25/2016  . Insomnia  05/16/2016  . Pneumonitis, radiation ([place]) 04/11/2016  . S/P lobectomy of lung 01/18/2016  . Chemotherapy-induced neuropathy ([place]) 11/01/2015  . Non-small cell carcinoma of right lung, stage 2 ([place]) 10/03/2015  . Morbid obesity ([place]) 10/01/2015  . Hyperlipidemia 08/14/2013  . Spondylolisthesis of lumbar region 07/03/2013  . Glaucoma 10/23/2012  . Essential hypertension 12/17/2011  . COPD GOLD II  12/17/2011  . Hearing loss 12/17/2011  . Renal cell cancer ([place]) 12/17/2011  . Sciatica of left side 12/17/2011  . Depression 12/17/2011    Past Surgical History:  Procedure Laterality Date  . BACK SURGERY     cervical 1991  . EYE SURGERY    . kidney cancer    . NEPHRECTOMY    . SPINE SURGERY    . TUBAL LIGATION    . VIDEO ASSISTED THORACOSCOPY (VATS)/WEDGE RESECTION Right 01/18/2016   Procedure: VIDEO ASSISTED THORACOSCOPY (VATS)/LUNG RESECTION, THOROCOTOMY, RIGHT UPPER LOBECTOMY, LYMPH NODE DISSECTION, PLACEMENT OF ON Q;  Surgeon: [name], MD;  Location: [facility];  Service: Thoracic;  Laterality: Right;  Marland Kitchen VIDEO BRONCHOSCOPY Bilateral 09/20/2015   Procedure: VIDEO BRONCHOSCOPY WITHOUT FLUORO;  Surgeon: Rigoberto Noel, MD;  Location: [facility];  Service: Cardiopulmonary;  Laterality: Bilateral;  . VIDEO BRONCHOSCOPY N/A 01/18/2016   Procedure: VIDEO BRONCHOSCOPY;  Surgeon: [name], MD;  Location: [facility];  Service: Thoracic;  Laterality: N/A;    OB History  No data available       Home Medications    Prior to Admission medications   Medication Sig Start Date End Date Taking? Authorizing Provider  acetaminophen (TYLENOL) 500 MG tablet Take 500 mg by mouth every 6 (six) hours as needed for mild pain, moderate pain, fever or headache. Reported on 05/24/2016   Yes Historical Provider, MD  albuterol (PROVENTIL HFA;VENTOLIN HFA) 108 (90 BASE) MCG/ACT inhaler Inhale 2 puffs into the lungs every 4 (four) hours as needed for wheezing or shortness of breath. 05/18/15 09/03/16  Yes [name], MD  albuterol (PROVENTIL) (2.5 MG/3ML) 0.083% nebulizer solution Take 2.5 mg by nebulization every 6 (six) hours as needed for wheezing or shortness of breath.   Yes Historical Provider, MD  aspirin EC 81 MG tablet Take 81 mg by mouth daily. Reported on 04/11/2016   Yes Historical Provider, MD  atorvastatin (LIPITOR) 40 MG tablet TAKE ONE TABLET BY MOUTH ONCE DAILY. 12/10/15  Yes Leandrew Koyanagi, MD  Cholecalciferol (VITAMIN D3 PO) Take 1 tablet by mouth daily.   Yes Historical Provider, MD  doxycycline (VIBRA-TABS) 100 MG tablet Take 1 tablet (100 mg total) by mouth 2 (two) times daily. 07/20/16  Yes Wendie Agreste, MD  fish oil-omega-3 fatty acids 1000 MG capsule Take 1 capsule by mouth daily.    Yes Historical Provider, MD  fluticasone (FLONASE) 50 MCG/ACT nasal spray Place 2 sprays into both nostrils daily as needed for allergies.    Yes Historical Provider, MD  HYDROcodone-homatropine (HYCODAN) 5-1.5 MG/5ML syrup Take 5 mLs by mouth every 6 (six) hours as needed for cough. 06/25/16  Yes Susanne Borders, NP  LORazepam (ATIVAN) 0.5 MG tablet Take 1 tablet (0.5 mg total) by mouth 2 (two) times daily as needed for anxiety. 10/05/15  Yes [name], MD  polyvinyl alcohol-povidone (REFRESH) 1.4-0.6 % ophthalmic solution Place 2 drops into both eyes daily as needed (for dry eyes). Reported on 05/24/2016   Yes Historical Provider, MD  predniSONE (DELTASONE) 10 MG tablet Take 2 tabs (20 mg) PO Q AM x 14 days; then 1 tab (10 mg) PO Q AM x 14 days; then 1/2 tab (5 mg) PO Q AM x 1 week; then take 1/2 tab (5 mg) PO Q AM every other day till gone. 07/04/16  Yes Susanne Borders, NP  temazepam (RESTORIL) 15 MG capsule Take 1 capsule (15 mg total) by mouth at bedtime as needed for sleep. 05/24/16  Yes Curt Bears, MD  traMADol (ULTRAM) 50 MG tablet Take 50 mg by mouth every 6 (six) hours as needed for moderate pain. Reported on 05/24/2016   Yes Historical Provider, MD  valsartan (DIOVAN)  80 MG tablet TAKE ONE TABLET BY MOUTH ONCE DAILY 07/23/16  Yes Mancel Bale, PA-C  methylPREDNISolone (MEDROL DOSEPAK) 4 MG TBPK tablet Medrol dose pak: take as directed. Patient not taking: Reported on 07/30/2016 06/25/16   Susanne Borders, NP  oxyCODONE-acetaminophen (PERCOCET) 5-325 MG tablet Take 1 tablet by mouth every 4 (four) hours as needed for severe pain. 07/30/16   Daleen Bo, MD  predniSONE (DELTASONE) 20 MG tablet Take 1 tablet (20 mg total) by mouth daily with breakfast. Patient not taking: Reported on 07/30/2016 07/20/16   Wendie Agreste, MD  UNABLE TO FIND Med Name: [facility] Felt 11/01/15   Curt Bears, MD    Family History Family History  Problem Relation Age of Onset  . Heart disease Sister   . Obesity Brother   . Heart  attack Daughter 74    s/p CABG  . Glaucoma Daughter   . Breast cancer Sister   . Birth defects Sister   . Cancer Mother     Bladder Cancer  . Hypertension Mother   . CAD Mother 44  . Cancer Maternal Grandmother     Social History Social History  Substance Use Topics  . Smoking status: Former Smoker    Packs/day: 0.50    Years: 50.00    Types: Cigarettes    Quit date: 03/16/2011  . Smokeless tobacco: Never Used  . Alcohol use No     Allergies   Bee venom; Amlodipine; Levofloxacin; and Hctz [hydrochlorothiazide]   Review of Systems Review of Systems  All other systems reviewed and are negative.    Physical Exam Updated Vital Signs BP 136/80 (BP Location: Left Arm)   Pulse 74   Temp 98.2 F (36.8 C) (Oral)   Resp 19   SpO2 93%   Physical Exam  Constitutional: She is oriented to person, place, and time. She appears well-developed and well-nourished. She appears distressed (She is uncomfortable.).  HENT:  Head: Normocephalic and atraumatic.  Eyes: Conjunctivae and EOM are normal. Pupils are equal, round, and reactive to light.  Neck: Normal range of motion and phonation normal. Neck supple.  Cardiovascular: Normal rate and regular  rhythm.   Pulmonary/Chest: Effort normal and breath sounds normal. No respiratory distress. She has no wheezes.  Pain left lower chest wall with deep breathing, causes patient to gasp. There is no focal chest wall tenderness or crepitation.  Abdominal: Soft. She exhibits no distension. There is no tenderness. There is no guarding.  Musculoskeletal: Normal range of motion.  Neurological: She is alert and oriented to person, place, and time. She exhibits normal muscle tone.  Skin: Skin is warm and dry.  Psychiatric: She has a normal mood and affect. Her behavior is normal. Judgment and thought content normal.  Nursing note and vitals reviewed.    ED Treatments / Results  Labs (all labs ordered are listed, but only abnormal results are displayed) Labs Reviewed  CBC WITH DIFFERENTIAL/PLATELET - Abnormal; Notable for the following:       Result Value   RBC 3.68 (*)    Hemoglobin 11.0 (*)    HCT 35.1 (*)    RDW 17.5 (*)    All other components within normal limits  BASIC METABOLIC PANEL - Abnormal; Notable for the following:    Glucose, Bld 110 (*)    BUN 40 (*)    Creatinine, Ser 1.40 (*)    GFR calc non Af Amer 37 (*)    GFR calc Af Amer 42 (*)    All other components within normal limits  I-STAT CG4 LACTIC ACID, ED    EKG  EKG Interpretation  Date/Time:  Monday July 30 2016 18:09:55 EDT Ventricular Rate:  94 PR Interval:    QRS Duration: 80 QT Interval:  351 QTC Calculation: 439 R Axis:   84 Text Interpretation:  Sinus rhythm Borderline right axis deviation Low voltage, precordial leads since last tracing no significant change Confirmed by Eulis Foster  MD, Camillia Marcy [phone]) on 07/30/2016 6:15:32 PM       Radiology Dg Chest 2 View  Result Date: 07/30/2016 CLINICAL DATA:  Acute onset of generalized chest and back pain. Personal history of radiation induced pneumonitis. Initial encounter. EXAM: CHEST  2 VIEW COMPARISON:  Chest radiograph performed 07/20/2016 FINDINGS:  Right-sided postradiation changes are less prominent on the current study.  No pleural effusion or pneumothorax is seen. No superimposed focal airspace consolidation is identified. The heart is mildly enlarged. No acute osseous abnormalities are identified. Cervical spinal fusion hardware is noted. IMPRESSION: No acute cardiopulmonary process seen. Mild cardiomegaly and chronic right-sided postradiation changes. Electronically Signed   By: Garald Balding M.D.   On: 07/30/2016 19:02   Dg Ribs Unilateral Left  Result Date: 07/30/2016 CLINICAL DATA:  Left posterior rib pain for 1 day, cough EXAM: LEFT RIBS - 2 VIEW COMPARISON:  07/27/2016 FINDINGS: Four views left ribs submitted. No rib fracture is identified. There is no pneumothorax. IMPRESSION: Negative. Electronically Signed   By: Lahoma Crocker M.D.   On: 07/30/2016 21:28    Procedures Procedures (including critical care time)  Medications Ordered in ED Medications  oxyCODONE-acetaminophen (PERCOCET/ROXICET) 5-325 MG per tablet 1 tablet (1 tablet Oral Given 07/30/16 2040)     Initial Impression / Assessment and Plan / ED Course  I have reviewed the triage vital signs and the nursing notes.  Pertinent labs & imaging results that were available during my care of the patient were reviewed by me and considered in my medical decision making (see chart for details).  Clinical Course  Comment By Time  Review of old records indicates that the patient's GFR tends to run in the high 40s and low 50s over multiple evaluations within the last year. Today it is lower than that, at 27. Daleen Bo, MD 09/18 2121    Medications  oxyCODONE-acetaminophen (PERCOCET/ROXICET) 5-325 MG per tablet 1 tablet (1 tablet Oral Given 07/30/16 2040)    Patient Vitals for the past 24 hrs:  BP Temp Temp src Pulse Resp SpO2  07/30/16 2145 136/80 98.2 F (36.8 C) Oral 74 19 93 %  07/30/16 1904 152/77 - - 89 18 90 %  07/30/16 1811 142/84 98.2 F (36.8 C) Oral 92 17 95  %    10:04 PM Reevaluation with update and discussion. After initial assessment and treatment, an updated evaluation reveals She is somewhat more comfortable now. Findings discussed with patient and all questions answered. Gwendelyn Lanting L    Final Clinical Impressions(s) / ED Diagnoses   Final diagnoses:  Pain  Chest wall pain  Renal insufficiency    Nonspecific intermittent left posterior chest pain, lower. Doubt fracture, pneumonia, radiculopathy, or vascular disorder. Somewhat worsening renal function based on GFR assessment. I have low suspicion for PE at this time. She is currently under treatment for radiation pneumonitis with antibiotics, and prolonged redness on taper. Incidentally, lowered GFR, is nonspecific, and can be treated symptomatically with increase oral fluids. She will need follow-up for that.  Nursing Notes Reviewed/ Care Coordinated Applicable Imaging Reviewed Interpretation of Laboratory Data incorporated into ED treatment  The patient appears reasonably screened and/or stabilized for discharge and I doubt any other medical condition or other [facility] requiring further screening, evaluation, or treatment in the ED at this time prior to discharge.  Plan: Home Medications- continue; Home Treatments-increase oral fluids, rest, heat; return here if the recommended treatment, does not improve the symptoms; Recommended follow up- PCP prn. Urology regarding somewhat worse renal function     Daleen Bo, MD 07/30/16 2206

## 2016-07-30 NOTE — ED Notes (Signed)
Patient was alert, oriented and stable upon discharge. RN went over AVS and patient had no further questions.  

## 2016-07-30 NOTE — ED Triage Notes (Signed)
Pt states that she is a lung CA pt and has radiation induced pneumonitis and a 'bacterial infection.' States that she started having chest and back pain yesterday that worsened today. Alert and oriented.

## 2016-07-30 NOTE — ED Notes (Signed)
Bed: [facility] Expected date:  Expected time:  Means of arrival:  Comments: 

## 2016-07-30 NOTE — Discharge Instructions (Signed)
Use heat on the sore area 3 or 4 times a day.  Return here, if needed, for problems.

## 2016-07-31 ENCOUNTER — Encounter: Payer: Self-pay | Admitting: Family Medicine

## 2016-08-14 ENCOUNTER — Other Ambulatory Visit (HOSPITAL_BASED_OUTPATIENT_CLINIC_OR_DEPARTMENT_OTHER): Payer: Medicare Other

## 2016-08-14 DIAGNOSIS — C3491 Malignant neoplasm of unspecified part of right bronchus or lung: Secondary | ICD-10-CM | POA: Diagnosis not present

## 2016-08-14 DIAGNOSIS — G620 Drug-induced polyneuropathy: Secondary | ICD-10-CM

## 2016-08-14 DIAGNOSIS — T451X5A Adverse effect of antineoplastic and immunosuppressive drugs, initial encounter: Secondary | ICD-10-CM

## 2016-08-14 LAB — CBC WITH DIFFERENTIAL/PLATELET
BASO%: 0.7 % (ref 0.0–2.0)
Basophils Absolute: 0 10*3/uL (ref 0.0–0.1)
EOS%: 1.5 % (ref 0.0–7.0)
Eosinophils Absolute: 0.1 10*3/uL (ref 0.0–0.5)
HCT: 36.9 % (ref 34.8–46.6)
HGB: 11.8 g/dL (ref 11.6–15.9)
LYMPH%: 13.4 % — ABNORMAL LOW (ref 14.0–49.7)
MCH: 29.7 pg (ref 25.1–34.0)
MCHC: 31.8 g/dL (ref 31.5–36.0)
MCV: 93.3 fL (ref 79.5–101.0)
MONO#: 0.5 10*3/uL (ref 0.1–0.9)
MONO%: 7.1 % (ref 0.0–14.0)
NEUT#: 5.2 10*3/uL (ref 1.5–6.5)
NEUT%: 77.3 % — ABNORMAL HIGH (ref 38.4–76.8)
Platelets: 279 10*3/uL (ref 145–400)
RBC: 3.96 10*6/uL (ref 3.70–5.45)
RDW: 18 % — ABNORMAL HIGH (ref 11.2–14.5)
WBC: 6.8 10*3/uL (ref 3.9–10.3)
lymph#: 0.9 10*3/uL (ref 0.9–3.3)

## 2016-08-14 LAB — COMPREHENSIVE METABOLIC PANEL
ALT: 21 U/L (ref 0–55)
AST: 17 U/L (ref 5–34)
Albumin: 3.7 g/dL (ref 3.5–5.0)
Alkaline Phosphatase: 85 U/L (ref 40–150)
Anion Gap: 11 mEq/L (ref 3–11)
BUN: 19.5 mg/dL (ref 7.0–26.0)
CO2: 25 mEq/L (ref 22–29)
Calcium: 9.8 mg/dL (ref 8.4–10.4)
Chloride: 106 mEq/L (ref 98–109)
Creatinine: 1.1 mg/dL (ref 0.6–1.1)
EGFR: 51 mL/min/{1.73_m2} — ABNORMAL LOW (ref >90)
Glucose: 105 mg/dl (ref 70–140)
Potassium: 5.1 mEq/L (ref 3.5–5.1)
Sodium: 141 mEq/L (ref 136–145)
Total Bilirubin: 0.35 mg/dL (ref 0.20–1.20)
Total Protein: 7.3 g/dL (ref 6.4–8.3)

## 2016-08-21 ENCOUNTER — Encounter: Payer: Self-pay | Admitting: Internal Medicine

## 2016-08-21 ENCOUNTER — Ambulatory Visit (HOSPITAL_BASED_OUTPATIENT_CLINIC_OR_DEPARTMENT_OTHER): Payer: Medicare Other | Admitting: Internal Medicine

## 2016-08-21 ENCOUNTER — Telehealth: Payer: Self-pay | Admitting: Internal Medicine

## 2016-08-21 VITALS — BP 142/83 | HR 105 | Temp 98.7°F | Resp 20 | Ht 63.0 in | Wt 170.4 lb

## 2016-08-21 DIAGNOSIS — C3491 Malignant neoplasm of unspecified part of right bronchus or lung: Secondary | ICD-10-CM

## 2016-08-21 DIAGNOSIS — C771 Secondary and unspecified malignant neoplasm of intrathoracic lymph nodes: Secondary | ICD-10-CM

## 2016-08-21 DIAGNOSIS — G620 Drug-induced polyneuropathy: Secondary | ICD-10-CM

## 2016-08-21 NOTE — Telephone Encounter (Signed)
Avs report and appointment schedule given to patient, per 08/21/16 los.

## 2016-08-21 NOTE — Progress Notes (Signed)
[facility] [phone]   [phone]  OFFICE PROGRESS NOTE  [name], MD [place] [address]  DIAGNOSIS: Stage IIIA (T2a, N2, M0) non-small cell lung cancer, squamous cell carcinoma presented with large right upper lobe lung mass with right hilar involvement diagnosed in November 2016.  PRIOR THERAPY:  1) Neoadjuvant systemic chemotherapy with carboplatin for AUC of 6 and paclitaxel 200 MG/M2 every 3 weeks, status post 3 cycles with partial response. 2) Video bronchoscopy, right video-assisted thoracoscopy, minithoracotomy, right upper lobectomy with lymph node dissection on 01/18/2016. The final pathology revealed residual squamous cell carcinoma measuring 3.1 cm with close resection margin and metastatic carcinoma to lymph nodes at levels 10R, 12R and 4R. (ypT2a, yN2).  3) Concurrent chemoradiation with weekly carboplatin for AUC of 2 and paclitaxel 45 MG/M2. First dose 02/27/2016. Status post 5 cycles. Last dose was given 03/26/2016.  CURRENT THERAPY: Observation.  INTERVAL HISTORY: Tammy Ball 75 y.o. female returns to the clinic today for follow-up visit. The patient is feeling much better today. She was treated with concurrent chemoradiation which was complicated with radiation-induced pneumonitis. She was treated with taper her steroids and is feeling much better. She denied having any significant weight loss or night sweats. She denied having any nausea or vomiting, no fever or chills. She has no significant shortness of breath, cough or hemoptysis. Her last CT scan of the chest in August 2017 was unremarkable for any disease recurrence. She is less today for evaluation and repeat blood work.  MEDICAL HISTORY: Past Medical History:  Diagnosis Date  . Anemia    was while doing chemo  . Anxiety   . Asthma   . Chemotherapy-induced neuropathy ([facility]) 11/01/2015  . Claustrophobia   . COPD (chronic obstructive pulmonary  disease) ([facility])   . Depression   . Encounter for antineoplastic chemotherapy 02/09/2016  . Glaucoma   . Headache    prior to menopause  . Heart murmur   . History of echocardiogram    Echo 2/17: EF 81-19%, grade 1 diastolic dysfunction, mild MR, trivial pericardial effusion  . History of nuclear stress test    Myoview 2/17: no ischemia or scar, EF 79%; low risk  . Hyperlipidemia   . Hypertension   . Insomnia 05/16/2016  . Non-small cell carcinoma of right lung, stage 2 ([facility]) 10/03/2015  . Radiation 02/29/16-04/10/16   50.4 Gy to right central chest  . Renal cell carcinoma ([facility])    L nephrectomy  in 2012    ALLERGIES:  is allergic to bee venom; amlodipine; levofloxacin; and hctz [hydrochlorothiazide].  MEDICATIONS:  Current Outpatient Prescriptions  Medication Sig Dispense Refill  . acetaminophen (TYLENOL) 500 MG tablet Take 500 mg by mouth every 6 (six) hours as needed for mild pain, moderate pain, fever or headache. Reported on 05/24/2016    . albuterol (PROVENTIL HFA;VENTOLIN HFA) 108 (90 BASE) MCG/ACT inhaler Inhale 2 puffs into the lungs every 4 (four) hours as needed for wheezing or shortness of breath. 1 Inhaler 0  . albuterol (PROVENTIL) (2.5 MG/3ML) 0.083% nebulizer solution Take 2.5 mg by nebulization every 6 (six) hours as needed for wheezing or shortness of breath.    Marland Kitchen aspirin EC 81 MG tablet Take 81 mg by mouth daily. Reported on 04/11/2016    . atorvastatin (LIPITOR) 40 MG tablet TAKE ONE TABLET BY MOUTH ONCE DAILY. 90 tablet 3  . Cholecalciferol (VITAMIN D3 PO) Take 1 tablet by mouth daily.    Marland Kitchen  doxycycline (VIBRA-TABS) 100 MG tablet Take 1 tablet (100 mg total) by mouth 2 (two) times daily. 20 tablet 0  . fish oil-omega-3 fatty acids 1000 MG capsule Take 1 capsule by mouth daily.     . fluticasone (FLONASE) 50 MCG/ACT nasal spray Place 2 sprays into both nostrils daily as needed for allergies.     Marland Kitchen HYDROcodone-homatropine (HYCODAN) 5-1.5 MG/5ML syrup Take 5 mLs by mouth  every 6 (six) hours as needed for cough. 120 mL 0  . LORazepam (ATIVAN) 0.5 MG tablet Take 1 tablet (0.5 mg total) by mouth 2 (two) times daily as needed for anxiety. 10 tablet 1  . methylPREDNISolone (MEDROL DOSEPAK) 4 MG TBPK tablet Medrol dose pak: take as directed. (Patient not taking: Reported on 07/30/2016) 21 tablet 0  . oxyCODONE-acetaminophen (PERCOCET) 5-325 MG tablet Take 1 tablet by mouth every 4 (four) hours as needed for severe pain. 20 tablet 0  . polyvinyl alcohol-povidone (REFRESH) 1.4-0.6 % ophthalmic solution Place 2 drops into both eyes daily as needed (for dry eyes). Reported on 05/24/2016    . predniSONE (DELTASONE) 10 MG tablet Take 2 tabs (20 mg) PO Q AM x 14 days; then 1 tab (10 mg) PO Q AM x 14 days; then 1/2 tab (5 mg) PO Q AM x 1 week; then take 1/2 tab (5 mg) PO Q AM every other day till gone. 50 tablet 0  . predniSONE (DELTASONE) 20 MG tablet Take 1 tablet (20 mg total) by mouth daily with breakfast. (Patient not taking: Reported on 07/30/2016) 5 tablet 0  . temazepam (RESTORIL) 15 MG capsule Take 1 capsule (15 mg total) by mouth at bedtime as needed for sleep. 30 capsule 0  . traMADol (ULTRAM) 50 MG tablet Take 50 mg by mouth every 6 (six) hours as needed for moderate pain. Reported on 05/24/2016    . UNABLE TO FIND Med Name: Wig 1 each 0  . valsartan (DIOVAN) 80 MG tablet TAKE ONE TABLET BY MOUTH ONCE DAILY 30 tablet 0   No current facility-administered medications for this visit.     SURGICAL HISTORY:  Past Surgical History:  Procedure Laterality Date  . BACK SURGERY     cervical 1991  . EYE SURGERY    . kidney cancer    . NEPHRECTOMY    . SPINE SURGERY    . TUBAL LIGATION    . VIDEO ASSISTED THORACOSCOPY (VATS)/WEDGE RESECTION Right 01/18/2016   Procedure: VIDEO ASSISTED THORACOSCOPY (VATS)/LUNG RESECTION, THOROCOTOMY, RIGHT UPPER LOBECTOMY, LYMPH NODE DISSECTION, PLACEMENT OF ON Q;  Surgeon: [name], MD;  Location: [facility];  Service: Thoracic;   Laterality: Right;  Marland Kitchen VIDEO BRONCHOSCOPY Bilateral 09/20/2015   Procedure: VIDEO BRONCHOSCOPY WITHOUT FLUORO;  Surgeon: Rigoberto Noel, MD;  Location: [facility];  Service: Cardiopulmonary;  Laterality: Bilateral;  . VIDEO BRONCHOSCOPY N/A 01/18/2016   Procedure: VIDEO BRONCHOSCOPY;  Surgeon: [name], MD;  Location: [facility] OR;  Service: Thoracic;  Laterality: N/A;    REVIEW OF SYSTEMS:  A comprehensive review of systems was negative except for: Constitutional: positive for fatigue Respiratory: positive for dyspnea on exertion   PHYSICAL EXAMINATION: General appearance: alert, cooperative, fatigued and no distress Head: Normocephalic, without obvious abnormality, atraumatic Neck: no adenopathy, no JVD, supple, symmetrical, trachea midline and thyroid not enlarged, symmetric, no tenderness/mass/nodules Lymph nodes: Cervical, supraclavicular, and axillary nodes normal. Resp: clear to auscultation bilaterally Back: symmetric, no curvature. ROM normal. No CVA tenderness. Cardio: regular rate and rhythm, S1, S2 normal, no  murmur, click, rub or gallop GI: soft, non-tender; bowel sounds normal; no masses,  no organomegaly Extremities: extremities normal, atraumatic, no cyanosis or edema Neurologic: Alert and oriented X 3, normal strength and tone. Normal symmetric reflexes. Normal coordination and gait  ECOG PERFORMANCE STATUS: 1 - Symptomatic but completely ambulatory  There were no vitals taken for this visit.  LABORATORY DATA: Lab Results  Component Value Date   WBC 6.8 08/14/2016   HGB 11.8 08/14/2016   HCT 36.9 08/14/2016   MCV 93.3 08/14/2016   PLT 279 08/14/2016      Chemistry      Component Value Date/Time   NA 141 08/14/2016 0851   K 5.1 08/14/2016 0851   CL 103 07/30/2016 2001   CO2 25 08/14/2016 0851   BUN 19.5 08/14/2016 0851   CREATININE 1.1 08/14/2016 0851   GLU 105 12/10/2009      Component Value Date/Time   CALCIUM 9.8 08/14/2016 0851   ALKPHOS 85  08/14/2016 0851   AST 17 08/14/2016 0851   ALT 21 08/14/2016 0851   BILITOT 0.35 08/14/2016 0851       RADIOGRAPHIC STUDIES: Dg Chest 2 View  Result Date: 07/30/2016 CLINICAL DATA:  Acute onset of generalized chest and back pain. Personal history of radiation induced pneumonitis. Initial encounter. EXAM: CHEST  2 VIEW COMPARISON:  Chest radiograph performed 07/20/2016 FINDINGS: Right-sided postradiation changes are less prominent on the current study. No pleural effusion or pneumothorax is seen. No superimposed focal airspace consolidation is identified. The heart is mildly enlarged. No acute osseous abnormalities are identified. Cervical spinal fusion hardware is noted. IMPRESSION: No acute cardiopulmonary process seen. Mild cardiomegaly and chronic right-sided postradiation changes. Electronically Signed   By: Garald Balding M.D.   On: 07/30/2016 19:02   Dg Ribs Unilateral Left  Result Date: 07/30/2016 CLINICAL DATA:  Left posterior rib pain for 1 day, cough EXAM: LEFT RIBS - 2 VIEW COMPARISON:  07/27/2016 FINDINGS: Four views left ribs submitted. No rib fracture is identified. There is no pneumothorax. IMPRESSION: Negative. Electronically Signed   By: Lahoma Crocker M.D.   On: 07/30/2016 21:28    ASSESSMENT AND PLAN: This is a very pleasant 75 years old white female recently diagnosed with stage II a non-small cell lung cancer, squamous cell carcinoma and completed neoadjuvant systemic chemotherapy with carboplatin and paclitaxel status post 3 cycles.  The recent CT scan of the chest showed partial response response with significant decrease in the size of the central right upper lobe pulmonary nodule and increased cavitation.  The patient underwent right upper lobectomy with lymph node dissection that showed residual tumor in addition to metastatic disease to the mediastinal lymph nodes and close resection margin. The patient was started on a course of concurrent chemoradiation with weekly  carboplatin and paclitaxel status post 5 cycles. She tolerated the 5 cycles well except for mild fatigue.  The recent CT scan of the chest in August 2017 showed no evidence for disease recurrence. I recommended for the patient to continue on observation and she is scheduled for repeat CT scan of the chest on 09/20/2016. She will come back for follow-up visit at that time. For the peripheral neuropathy, she will continue on observation for now with close monitoring. She was advised to call immediately if she has any concerning symptoms in the interval. The patient voices understanding of current disease status and treatment options and is in agreement with the current care plan.  All questions were answered. The patient knows  to call the clinic with any problems, questions or concerns. We can certainly see the patient much sooner if necessary.  Disclaimer: This note was dictated with voice recognition software. Similar sounding words can inadvertently be transcribed and may not be corrected upon review.

## 2016-08-22 ENCOUNTER — Encounter: Payer: Medicare Other | Admitting: Family Medicine

## 2016-08-26 DIAGNOSIS — Z23 Encounter for immunization: Secondary | ICD-10-CM | POA: Diagnosis not present

## 2016-08-27 ENCOUNTER — Other Ambulatory Visit: Payer: Self-pay | Admitting: Physician Assistant

## 2016-08-27 DIAGNOSIS — I10 Essential (primary) hypertension: Secondary | ICD-10-CM

## 2016-08-29 ENCOUNTER — Encounter: Payer: Self-pay | Admitting: Family Medicine

## 2016-08-29 ENCOUNTER — Ambulatory Visit (INDEPENDENT_AMBULATORY_CARE_PROVIDER_SITE_OTHER): Payer: Medicare Other | Admitting: Family Medicine

## 2016-08-29 VITALS — BP 124/80 | HR 109 | Temp 97.8°F | Resp 18 | Ht 63.0 in | Wt 168.2 lb

## 2016-08-29 DIAGNOSIS — J449 Chronic obstructive pulmonary disease, unspecified: Secondary | ICD-10-CM

## 2016-08-29 DIAGNOSIS — I10 Essential (primary) hypertension: Secondary | ICD-10-CM | POA: Diagnosis not present

## 2016-08-29 DIAGNOSIS — C642 Malignant neoplasm of left kidney, except renal pelvis: Secondary | ICD-10-CM | POA: Diagnosis not present

## 2016-08-29 DIAGNOSIS — J700 Acute pulmonary manifestations due to radiation: Secondary | ICD-10-CM

## 2016-08-29 DIAGNOSIS — Z0000 Encounter for general adult medical examination without abnormal findings: Secondary | ICD-10-CM

## 2016-08-29 DIAGNOSIS — E7800 Pure hypercholesterolemia, unspecified: Secondary | ICD-10-CM | POA: Diagnosis not present

## 2016-08-29 DIAGNOSIS — M5432 Sciatica, left side: Secondary | ICD-10-CM

## 2016-08-29 DIAGNOSIS — T451X5A Adverse effect of antineoplastic and immunosuppressive drugs, initial encounter: Secondary | ICD-10-CM | POA: Diagnosis not present

## 2016-08-29 DIAGNOSIS — G620 Drug-induced polyneuropathy: Secondary | ICD-10-CM

## 2016-08-29 DIAGNOSIS — C3491 Malignant neoplasm of unspecified part of right bronchus or lung: Secondary | ICD-10-CM | POA: Diagnosis not present

## 2016-08-29 LAB — LIPID PANEL
Cholesterol: 209 mg/dL — ABNORMAL HIGH (ref 125–200)
HDL: 117 mg/dL (ref >=46)
LDL Cholesterol: 73 mg/dL (ref <130)
Total CHOL/HDL Ratio: 1.8 Ratio (ref <=5.0)
Triglycerides: 94 mg/dL (ref <150)
VLDL: 19 mg/dL (ref <30)

## 2016-08-29 LAB — POCT URINALYSIS DIP (MANUAL ENTRY)
Bilirubin, UA: NEGATIVE
Blood, UA: NEGATIVE
Glucose, UA: NEGATIVE
Ketones, POC UA: NEGATIVE
Leukocytes, UA: NEGATIVE
Nitrite, UA: NEGATIVE
Protein Ur, POC: NEGATIVE
Spec Grav, UA: <=1.005
Urobilinogen, UA: 0.2
pH, UA: 5.5

## 2016-08-29 MED ORDER — VALSARTAN 80 MG PO TABS: ORAL_TABLET | ORAL | 3 refills | Status: DC

## 2016-08-29 NOTE — Progress Notes (Signed)
Subjective:    Patient ID: Tammy Ball, female    DOB: 1941/01/18, 75 y.o.   MRN: [id number]  08/29/2016  Annual Exam (CPE)   HPI This 75 y.o. female presents for Annual Wellness Examination and follow-up of chronic medical conditions.  Last physical:  12-02-2013 Pap smear: 2011 Mammogram:  05/2015 Colonoscopy:  2013; single polyp.  Repeat 10 years. Bone density: 05/2015 TDAP:  2015 Pneumovax:  Prevnar 13 08-25-16 Zostavax:  2012 Influenza:  2017  Eye exam:  Scheduled tomorrow.  S/p cataract surgery B. Dental exam:  Every six months.  Immunization History  Administered Date(s) Administered  . Hepatitis B 11/12/1998  . Influenza Split 09/12/2012  . Influenza,inj,Quad PF,36+ Mos 07/18/2013  . Influenza-Unspecified 07/29/2014, 07/23/2015, 08/25/2016  . Pneumococcal Conjugate-13 07/29/2015  . Pneumococcal Polysaccharide-23 11/12/2000, 10/23/2012  . Pneumococcal-Unspecified 10/23/2012  . Tdap 12/02/2013  . Zoster 12/13/2010    PCP: Joseph Art   Lung cancer: surgery March 2017; chemo and radiation x 6 weeks.  Suffered with pneumonitis; s/p steroids recently.  Presented 07/2016; improvement with treatment.  ED visit in September acute pain in L lung; saw [facility]; CXR WNL.  When quit smoking in 2012, gained 50 pounds.Has lost 20 pounds.  Oncologist gave permission to modify diet.  Kidney cancer: in past.    HTN: Patient reports good compliance with medication, good tolerance to medication, and good symptom control.  Runs slightly high.    Hyperlipidemia: Patient reports good compliance with medication, good tolerance to medication, and good symptom control.     BP Readings from Last 3 Encounters:  08/29/16 124/80  08/21/16 (!) 142/83  07/30/16 136/80   Wt Readings from Last 3 Encounters:  08/29/16 168 lb 3.2 oz (76.3 kg)  08/21/16 170 lb 6.4 oz (77.3 kg)  07/20/16 169 lb 6.4 oz (76.8 kg)    Review of Systems  Constitutional: Negative for activity change, appetite  change, chills, diaphoresis, fatigue, fever and unexpected weight change.  HENT: Positive for congestion, postnasal drip and rhinorrhea. Negative for dental problem, drooling, ear discharge, ear pain, facial swelling, hearing loss, mouth sores, nosebleeds, sinus pressure, sneezing, sore throat, tinnitus, trouble swallowing and voice change.   Eyes: Negative for photophobia, pain, discharge, redness, itching and visual disturbance.  Respiratory: Positive for cough and shortness of breath. Negative for apnea, choking, chest tightness, wheezing and stridor.   Cardiovascular: Negative for chest pain, palpitations and leg swelling.  Gastrointestinal: Negative for abdominal distention, abdominal pain, anal bleeding, blood in stool, constipation, diarrhea, nausea, rectal pain and vomiting.  Endocrine: Negative for cold intolerance, heat intolerance, polydipsia, polyphagia and polyuria.  Genitourinary: Negative for decreased urine volume, difficulty urinating, dyspareunia, dysuria, enuresis, flank pain, frequency, genital sores, hematuria, menstrual problem, pelvic pain, urgency, vaginal bleeding, vaginal discharge and vaginal pain.       Nocturia x 4-5; no URINARY INCONTINENCE.  Musculoskeletal: Negative for arthralgias, back pain, gait problem, joint swelling, myalgias, neck pain and neck stiffness.  Skin: Negative for color change, pallor, rash and wound.  Allergic/Immunologic: Negative for environmental allergies, food allergies and immunocompromised state.  Neurological: Negative for dizziness, tremors, seizures, syncope, facial asymmetry, speech difficulty, weakness, light-headedness, numbness and headaches.  Hematological: Negative for adenopathy. Does not bruise/bleed easily.  Psychiatric/Behavioral: Negative for agitation, behavioral problems, confusion, decreased concentration, dysphoric mood, hallucinations, self-injury, sleep disturbance and suicidal ideas. The patient is nervous/anxious. The  patient is not hyperactive.        BEDTIME 8:30PM; wakes up 5:30am.     Past Medical History:  Diagnosis Date  . Anemia    was while doing chemo  . Anxiety   . Asthma   . Chemotherapy-induced neuropathy ([place]) 11/01/2015  . Claustrophobia   . COPD (chronic obstructive pulmonary disease) ([place])   . Depression   . Encounter for antineoplastic chemotherapy 02/09/2016  . Glaucoma   . Headache    prior to menopause  . Heart murmur   . History of echocardiogram    Echo 2/17: EF 10-25%, grade 1 diastolic dysfunction, mild MR, trivial pericardial effusion  . History of nuclear stress test    Myoview 2/17: no ischemia or scar, EF 79%; low risk  . Hyperlipidemia   . Hypertension   . Insomnia 05/16/2016  . Non-small cell carcinoma of right lung, stage 2 ([place]) 10/03/2015  . Radiation 02/29/16-04/10/16   50.4 Gy to right central chest  . Renal cell carcinoma ([place])    L nephrectomy  in 2012   Past Surgical History:  Procedure Laterality Date  . BACK SURGERY     cervical 1991  . EYE SURGERY    . kidney cancer    . NEPHRECTOMY    . SPINE SURGERY    . TUBAL LIGATION    . VIDEO ASSISTED THORACOSCOPY (VATS)/WEDGE RESECTION Right 01/18/2016   Procedure: VIDEO ASSISTED THORACOSCOPY (VATS)/LUNG RESECTION, THOROCOTOMY, RIGHT UPPER LOBECTOMY, LYMPH NODE DISSECTION, PLACEMENT OF ON Q;  Surgeon: [name], MD;  Location: [facility];  Service: Thoracic;  Laterality: Right;  Marland Kitchen VIDEO BRONCHOSCOPY Bilateral 09/20/2015   Procedure: VIDEO BRONCHOSCOPY WITHOUT FLUORO;  Surgeon: Rigoberto Noel, MD;  Location: [facility];  Service: Cardiopulmonary;  Laterality: Bilateral;  . VIDEO BRONCHOSCOPY N/A 01/18/2016   Procedure: VIDEO BRONCHOSCOPY;  Surgeon: [name], MD;  Location: [facility] OR;  Service: Thoracic;  Laterality: N/A;   Allergies  Allergen Reactions  . Bee Venom Anaphylaxis, Shortness Of Breath, Swelling and Other (See Comments)    Swelling at site   . Amlodipine Swelling and Other (See Comments)      Swelling of the ankles and hands   . Levofloxacin Other (See Comments)    Joint pain   . Hctz [Hydrochlorothiazide] Palpitations and Other (See Comments)    Sweating    Current Outpatient Prescriptions  Medication Sig Dispense Refill  . acetaminophen (TYLENOL) 500 MG tablet Take 500 mg by mouth every 6 (six) hours as needed for mild pain, moderate pain, fever or headache. Reported on 05/24/2016    . albuterol (PROVENTIL HFA;VENTOLIN HFA) 108 (90 BASE) MCG/ACT inhaler Inhale 2 puffs into the lungs every 4 (four) hours as needed for wheezing or shortness of breath. 1 Inhaler 0  . albuterol (PROVENTIL) (2.5 MG/3ML) 0.083% nebulizer solution Take 2.5 mg by nebulization every 6 (six) hours as needed for wheezing or shortness of breath.    Marland Kitchen aspirin EC 81 MG tablet Take 81 mg by mouth daily. Reported on 04/11/2016    . atorvastatin (LIPITOR) 40 MG tablet TAKE ONE TABLET BY MOUTH ONCE DAILY. 90 tablet 3  . Cholecalciferol (VITAMIN D3 PO) Take 1 tablet by mouth daily.    . fish oil-omega-3 fatty acids 1000 MG capsule Take 1 capsule by mouth daily.     . fluticasone (FLONASE) 50 MCG/ACT nasal spray Place 2 sprays into both nostrils daily as needed for allergies.     Marland Kitchen LORazepam (ATIVAN) 0.5 MG tablet Take 1 tablet (0.5 mg total) by mouth 2 (two) times daily as needed for anxiety. 10 tablet 1  . polyvinyl alcohol-povidone (REFRESH)  1.4-0.6 % ophthalmic solution Place 2 drops into both eyes daily as needed (for dry eyes). Reported on 05/24/2016    . temazepam (RESTORIL) 15 MG capsule Take 1 capsule (15 mg total) by mouth at bedtime as needed for sleep. 30 capsule 0  . traMADol (ULTRAM) 50 MG tablet Take 50 mg by mouth every 6 (six) hours as needed for moderate pain. Reported on 05/24/2016    . UNABLE TO FIND Med Name: Wig 1 each 0  . valsartan (DIOVAN) 80 MG tablet TAKE ONE TABLET BY MOUTH ONCE DAILY 90 tablet 3   No current facility-administered medications for this visit.    Social History   Social  History  . Marital status: Widowed    Spouse name: N/A  . Number of children: N/A  . Years of education: N/A   Occupational History  . Not on file.   Social History Main Topics  . Smoking status: Former Smoker    Packs/day: 0.50    Years: 50.00    Types: Cigarettes    Quit date: 03/16/2011  . Smokeless tobacco: Never Used  . Alcohol use No  . Drug use: No  . Sexual activity: Not Currently   Other Topics Concern  . Not on file   Social History Narrative   Marital status: divorced; not dating.      Children: 4 children; 3 grandchildren adult; 4 gg.      Lives: alone in house      Employment: full time substance abuse counselor; [employer].      Tobacco: quit smoking 2012; smoked 45 years      Alcohol: none      Drugs: none      ADLs: independent with ADLs; drives.       Advanced Directives: YES: HCPOA: Nicholas Martinez/son.  FULL CODE but no prolonged measures.      Occupation: Substance Abuse Estate agent   No exercise** Merged History Encounter **       ** Data from: 12/14/11 Enc Dept: [facility]       ** Data from: 12/17/11 Enc Dept: [facility]   Substance abuse counselor   Husband deceased   4 great grandchildren   Son works in same substance abuse counseling center as patient   Family History  Problem Relation Age of Onset  . Heart disease Sister   . Obesity Brother   . Heart attack Daughter 41    s/p CABG  . Glaucoma Daughter   . Breast cancer Sister   . Birth defects Sister   . Cancer Mother     Bladder Cancer  . Hypertension Mother   . CAD Mother 35  . Cancer Maternal Grandmother        Objective:    BP 124/80   Pulse (!) 109   Temp 97.8 F (36.6 C) (Oral)   Resp 18   Ht '5\' 3"'$  (1.6 m)   Wt 168 lb 3.2 oz (76.3 kg)   SpO2 92%   BMI 29.80 kg/m  Physical Exam  Constitutional: She is oriented to person, place, and time. She appears well-developed and well-nourished. No distress.  HENT:    Head: Normocephalic and atraumatic.  Right Ear: External ear normal.  Left Ear: External ear normal.  Nose: Nose normal.  Mouth/Throat: Oropharynx is clear and moist.  Eyes: Conjunctivae and EOM are normal. Pupils are equal, round, and reactive to light.  Neck: Normal range of  motion and full passive range of motion without pain. Neck supple. No JVD present. Carotid bruit is not present. No thyromegaly present.  Cardiovascular: Normal rate, regular rhythm and normal heart sounds.  Exam reveals no gallop and no friction rub.   No murmur heard. Pulmonary/Chest: Effort normal and breath sounds normal. She has no wheezes. She has no rales. Right breast exhibits no inverted nipple, no mass, no nipple discharge, no skin change and no tenderness. Left breast exhibits no inverted nipple, no mass, no nipple discharge, no skin change and no tenderness. Breasts are symmetrical.  Abdominal: Soft. Bowel sounds are normal. She exhibits no distension and no mass. There is no tenderness. There is no rebound and no guarding.  Musculoskeletal:       Right shoulder: Normal.       Left shoulder: Normal.       Cervical back: Normal.  Lymphadenopathy:    She has no cervical adenopathy.  Neurological: She is alert and oriented to person, place, and time. She has normal reflexes. No cranial nerve deficit. She exhibits normal muscle tone. Coordination normal.  Skin: Skin is warm and dry. No rash noted. She is not diaphoretic. No erythema. No pallor.  Psychiatric: She has a normal mood and affect. Her behavior is normal. Judgment and thought content normal.  Nursing note and vitals reviewed.  Results for orders placed or performed in visit on 08/14/16  CBC with Differential/Platelet  Result Value Ref Range   WBC 6.8 3.9 - 10.3 10e3/uL   NEUT# 5.2 1.5 - 6.5 10e3/uL   HGB 11.8 11.6 - 15.9 g/dL   HCT 36.9 34.8 - 46.6 %   Platelets 279 145 - 400 10e3/uL   MCV 93.3 79.5 - 101.0 fL   MCH 29.7 25.1 - 34.0 pg   MCHC  31.8 31.5 - 36.0 g/dL   RBC 3.96 3.70 - 5.45 10e6/uL   RDW 18.0 (H) 11.2 - 14.5 %   lymph# 0.9 0.9 - 3.3 10e3/uL   MONO# 0.5 0.1 - 0.9 10e3/uL   Eosinophils Absolute 0.1 0.0 - 0.5 10e3/uL   Basophils Absolute 0.0 0.0 - 0.1 10e3/uL   NEUT% 77.3 (H) 38.4 - 76.8 %   LYMPH% 13.4 (L) 14.0 - 49.7 %   MONO% 7.1 0.0 - 14.0 %   EOS% 1.5 0.0 - 7.0 %   BASO% 0.7 0.0 - 2.0 %  Comprehensive metabolic panel  Result Value Ref Range   Sodium 141 136 - 145 mEq/L   Potassium 5.1 3.5 - 5.1 mEq/L   Chloride 106 98 - 109 mEq/L   CO2 25 22 - 29 mEq/L   Glucose 105 70 - 140 mg/dl   BUN 19.5 7.0 - 26.0 mg/dL   Creatinine 1.1 0.6 - 1.1 mg/dL   Total Bilirubin 0.35 0.20 - 1.20 mg/dL   Alkaline Phosphatase 85 40 - 150 U/L   AST 17 5 - 34 U/L   ALT 21 0 - 55 U/L   Total Protein 7.3 6.4 - 8.3 g/dL   Albumin 3.7 3.5 - 5.0 g/dL   Calcium 9.8 8.4 - 10.4 mg/dL   Anion Gap 11 3 - 11 mEq/L   EGFR 51 (L) >90 ml/min/1.73 m2   Fall Risk  08/29/2016 07/20/2016 06/14/2016 05/24/2016 02/15/2016  Falls in the past year? No No No No No   Depression screen [facility] 2/9 08/29/2016 07/20/2016 06/14/2016 02/15/2016 12/10/2015  Decreased Interest 0 0 0 0 0  Down, Depressed, Hopeless 0 0 0 0 0  PHQ - 2 Score 0 0 0 0 0   Functional Status Survey: Is the patient deaf or have difficulty hearing?: No Does the patient have difficulty seeing, even when wearing glasses/contacts?: No Does the patient have difficulty concentrating, remembering, or making decisions?: No Does the patient have difficulty walking or climbing stairs?: No Does the patient have difficulty dressing or bathing?: No Does the patient have difficulty doing errands alone such as visiting a doctor's office or shopping?: No     Assessment & Plan:   1. Encounter for Medicare annual wellness exam   2. Essential hypertension   3. COPD GOLD II    4. Non-small cell carcinoma of right lung, stage 2 ([place])   5. Pneumonitis, radiation ([place])   6. Sciatica of left side   7.  Chemotherapy-induced neuropathy ([place])   8. Renal cell carcinoma of left kidney ([facility])   9. Pure hypercholesterolemia    -anticipatory guidance -- weight loss, exercise, calcium 693m bid, ASA 817mdaily. -followed by oncology for non-small cell carcinoma R lung stage 2. -s/p L nephrectomy in 2012 for RCC.   -obtain labs; refills provided. -no evidence of depression; no hearing loss; low-fall risk; independent with ADLs; has advanced directives and desires FULL CODE.   Orders Placed This Encounter  Procedures  . Lipid panel    Order Specific Question:   Has the patient fasted?    Answer:   Yes  . POCT urinalysis dipstick   Meds ordered this encounter  Medications  . valsartan (DIOVAN) 80 MG tablet    Sig: TAKE ONE TABLET BY MOUTH ONCE DAILY    Dispense:  90 tablet    Refill:  3    Please consider 90 day supplies to promote better adherence    Return in about 6 months (around 02/27/2017) for recheck high blood pressure, high cholesterol.   Shakeera Rightmyer MaElayne GuerinM.D. [id number]  [phone]

## 2016-08-29 NOTE — Patient Instructions (Addendum)
   IF you received an x-ray today, you will receive an invoice from [facility]. Please contact [facility] at [phone] with questions or concerns regarding your invoice.   IF you received labwork today, you will receive an invoice from [facility]. Please contact [facility] at [phone] with questions or concerns regarding your invoice.   Our billing staff will not be able to assist you with questions regarding bills from these companies.  You will be contacted with the lab results as soon as they are available. The fastest way to get your results is to activate your My Chart account. Instructions are located on the last page of this paperwork. If you have not heard from us regarding the results in 2 weeks, please contact this office.    Keeping You Healthy  Get These Tests  Blood Pressure- Have your blood pressure checked by your healthcare provider at least once a year.  Normal blood pressure is 120/80.  Weight- Have your body mass index (BMI) calculated to screen for obesity.  BMI is a measure of body fat based on height and weight.  You can calculate your own BMI at www.nhlbisupport.com/bmi/  Cholesterol- Have your cholesterol checked every year.  Diabetes- Have your blood sugar checked every year if you have high blood pressure, high cholesterol, a family history of diabetes or if you are overweight.  Pap Test - Have a pap test every 1 to 5 years if you have been sexually active.  If you are older than 65 and recent pap tests have been normal you may not need additional pap tests.  In addition, if you have had a hysterectomy  for benign disease additional pap tests are not necessary.  Mammogram-Yearly mammograms are essential for early detection of breast cancer  Screening for Colon Cancer- Colonoscopy starting at age 50. Screening may begin sooner depending on your family history and other health conditions.  Follow up colonoscopy  as directed by your Gastroenterologist.  Screening for Osteoporosis- Screening begins at age 65 with bone density scanning, sooner if you are at higher risk for developing Osteoporosis.  Get these medicines  Calcium with Vitamin D- Your body requires 1200-1500 mg of Calcium a day and 800-1000 IU of Vitamin D a day.  You can only absorb 500 mg of Calcium at a time therefore Calcium must be taken in 2 or 3 separate doses throughout the day.  Hormones- Hormone therapy has been associated with increased risk for certain cancers and heart disease.  Talk to your healthcare provider about if you need relief from menopausal symptoms.  Aspirin- Ask your healthcare provider about taking Aspirin to prevent Heart Disease and Stroke.  Get these Immuniztions  Flu shot- Every fall  Pneumonia shot- Once after the age of 65; if you are younger ask your healthcare provider if you need a pneumonia shot.  Tetanus- Every ten years.  Zostavax- Once after the age of 60 to prevent shingles.  Take these steps  Don't smoke- Your healthcare provider can help you quit. For tips on how to quit, ask your healthcare provider or go to www.smokefree.gov or call 1-800 QUIT-NOW.  Be physically active- Exercise 5 days a week for a minimum of 30 minutes.  If you are not already physically active, start slow and gradually work up to 30 minutes of moderate physical activity.  Try walking, dancing, bike riding, swimming, etc.  Eat a healthy diet- Eat a variety of healthy foods such as fruits, vegetables, whole   grains, low fat milk, low fat cheeses, yogurt, lean meats, chicken, fish, eggs, dried beans, tofu, etc.  For more information go to www.thenutritionsource.org  Dental visit- Brush and floss teeth twice daily; visit your dentist twice a year.  Eye exam- Visit your Optometrist or Ophthalmologist yearly.  Drink alcohol in moderation- Limit alcohol intake to one drink or less a day.  Never drink and  drive.  Depression- Your emotional health is as important as your physical health.  If you're feeling down or losing interest in things you normally enjoy, please talk to your healthcare provider.  Seat Belts- can save your life; always wear one  Smoke/Carbon Monoxide detectors- These detectors need to be installed on the appropriate level of your home.  Replace batteries at least once a year.  Violence- If anyone is threatening or hurting you, please tell your healthcare provider.  Living Will/ Health care power of attorney- Discuss with your healthcare provider and family. 

## 2016-09-06 DIAGNOSIS — H5211 Myopia, right eye: Secondary | ICD-10-CM | POA: Diagnosis not present

## 2016-09-06 DIAGNOSIS — H52222 Regular astigmatism, left eye: Secondary | ICD-10-CM | POA: Diagnosis not present

## 2016-09-06 DIAGNOSIS — H04123 Dry eye syndrome of bilateral lacrimal glands: Secondary | ICD-10-CM | POA: Diagnosis not present

## 2016-09-06 DIAGNOSIS — H40013 Open angle with borderline findings, low risk, bilateral: Secondary | ICD-10-CM | POA: Diagnosis not present

## 2016-09-06 DIAGNOSIS — H52221 Regular astigmatism, right eye: Secondary | ICD-10-CM | POA: Diagnosis not present

## 2016-09-06 DIAGNOSIS — H1859 Other hereditary corneal dystrophies: Secondary | ICD-10-CM | POA: Diagnosis not present

## 2016-09-06 DIAGNOSIS — H524 Presbyopia: Secondary | ICD-10-CM | POA: Diagnosis not present

## 2016-09-18 ENCOUNTER — Encounter: Payer: Self-pay | Admitting: Family Medicine

## 2016-09-20 ENCOUNTER — Encounter (HOSPITAL_COMMUNITY): Payer: Self-pay

## 2016-09-20 ENCOUNTER — Telehealth: Payer: Self-pay | Admitting: *Deleted

## 2016-09-20 ENCOUNTER — Ambulatory Visit (HOSPITAL_COMMUNITY)
Admission: RE | Admit: 2016-09-20 | Discharge: 2016-09-20 | Disposition: A | Discharge status: Home / Self Care | Payer: Medicare Other | Source: Ambulatory Visit | Attending: Nurse Practitioner | Admitting: Nurse Practitioner

## 2016-09-20 ENCOUNTER — Other Ambulatory Visit (HOSPITAL_BASED_OUTPATIENT_CLINIC_OR_DEPARTMENT_OTHER): Payer: Medicare Other

## 2016-09-20 DIAGNOSIS — I700 Atherosclerosis of aorta: Secondary | ICD-10-CM | POA: Diagnosis not present

## 2016-09-20 DIAGNOSIS — Z902 Acquired absence of lung [part of]: Secondary | ICD-10-CM | POA: Insufficient documentation

## 2016-09-20 DIAGNOSIS — M4854XA Collapsed vertebra, not elsewhere classified, thoracic region, initial encounter for fracture: Secondary | ICD-10-CM | POA: Insufficient documentation

## 2016-09-20 DIAGNOSIS — J432 Centrilobular emphysema: Secondary | ICD-10-CM | POA: Insufficient documentation

## 2016-09-20 DIAGNOSIS — I2510 Atherosclerotic heart disease of native coronary artery without angina pectoris: Secondary | ICD-10-CM | POA: Insufficient documentation

## 2016-09-20 DIAGNOSIS — C3491 Malignant neoplasm of unspecified part of right bronchus or lung: Secondary | ICD-10-CM | POA: Diagnosis not present

## 2016-09-20 DIAGNOSIS — R911 Solitary pulmonary nodule: Secondary | ICD-10-CM | POA: Insufficient documentation

## 2016-09-20 DIAGNOSIS — C3411 Malignant neoplasm of upper lobe, right bronchus or lung: Secondary | ICD-10-CM | POA: Diagnosis not present

## 2016-09-20 LAB — COMPREHENSIVE METABOLIC PANEL
ALT: 15 U/L (ref 0–55)
AST: 17 U/L (ref 5–34)
Albumin: 3.8 g/dL (ref 3.5–5.0)
Alkaline Phosphatase: 107 U/L (ref 40–150)
Anion Gap: 10 mEq/L (ref 3–11)
BUN: 23.9 mg/dL (ref 7.0–26.0)
CO2: 26 mEq/L (ref 22–29)
Calcium: 10.3 mg/dL (ref 8.4–10.4)
Chloride: 107 mEq/L (ref 98–109)
Creatinine: 1.1 mg/dL (ref 0.6–1.1)
EGFR: 48 mL/min/{1.73_m2} — ABNORMAL LOW (ref >90)
Glucose: 100 mg/dl (ref 70–140)
Potassium: 5.3 mEq/L — ABNORMAL HIGH (ref 3.5–5.1)
Sodium: 143 mEq/L (ref 136–145)
Total Bilirubin: 0.42 mg/dL (ref 0.20–1.20)
Total Protein: 7.6 g/dL (ref 6.4–8.3)

## 2016-09-20 LAB — CBC WITH DIFFERENTIAL/PLATELET
BASO%: 0.8 % (ref 0.0–2.0)
Basophils Absolute: 0.1 10*3/uL (ref 0.0–0.1)
EOS%: 1.5 % (ref 0.0–7.0)
Eosinophils Absolute: 0.1 10*3/uL (ref 0.0–0.5)
HCT: 36.6 % (ref 34.8–46.6)
HGB: 11.8 g/dL (ref 11.6–15.9)
LYMPH%: 26.3 % (ref 14.0–49.7)
MCH: 29.6 pg (ref 25.1–34.0)
MCHC: 32.2 g/dL (ref 31.5–36.0)
MCV: 91.7 fL (ref 79.5–101.0)
MONO#: 0.6 10*3/uL (ref 0.1–0.9)
MONO%: 10.5 % (ref 0.0–14.0)
NEUT#: 3.7 10*3/uL (ref 1.5–6.5)
NEUT%: 60.9 % (ref 38.4–76.8)
Platelets: 303 10*3/uL (ref 145–400)
RBC: 3.99 10*6/uL (ref 3.70–5.45)
RDW: 17.5 % — ABNORMAL HIGH (ref 11.2–14.5)
WBC: 6 10*3/uL (ref 3.9–10.3)
lymph#: 1.6 10*3/uL (ref 0.9–3.3)

## 2016-09-20 MED ORDER — IOPAMIDOL (ISOVUE-300) INJECTION 61%
75.0000 mL | Freq: Once | INTRAVENOUS | Status: AC | PRN
  Administered 2016-09-20: 60 mL via INTRAVENOUS

## 2016-09-20 NOTE — Telephone Encounter (Signed)
Call reports received.  Confirmed receipt of CT chest via EPIC.  Call attention to impression number 1 and 3. IMPRESSION: 1. Enlarging solid 10 mm left lower lobe pulmonary nodule, likely a pulmonary metastasis. 2. No definite additional sites of metastatic disease in the chest. 3. New mild superior T3 vertebral compression fracture with associated patchy sclerosis without discrete bone lesion, consistent with an acute/subacute compression fracture. 4. No evidence of local tumor recurrence at the right upper lobectomy site. Decreasing evolving postradiation change in the right upper parahilar lung. 5. Additional findings include aortic atherosclerosis, 1 vessel coronary atherosclerosis, mild to moderate centrilobular emphysema and small hiatal hernia. These results will be called to the ordering clinician or representative by the Radiologist Assistant, and communication documented in the PACS or zVision Dashboard.   Electronically Signed   By: Ilona Sorrel M.D.   On: 09/20/2016 15:23

## 2016-09-25 ENCOUNTER — Telehealth: Payer: Self-pay | Admitting: Internal Medicine

## 2016-09-25 ENCOUNTER — Encounter: Payer: Self-pay | Admitting: Internal Medicine

## 2016-09-25 ENCOUNTER — Ambulatory Visit (HOSPITAL_BASED_OUTPATIENT_CLINIC_OR_DEPARTMENT_OTHER): Payer: Medicare Other | Admitting: Internal Medicine

## 2016-09-25 VITALS — BP 144/67 | HR 104 | Temp 98.9°F | Resp 17 | Ht 63.0 in | Wt 173.7 lb

## 2016-09-25 DIAGNOSIS — C3411 Malignant neoplasm of upper lobe, right bronchus or lung: Secondary | ICD-10-CM | POA: Diagnosis not present

## 2016-09-25 DIAGNOSIS — R5383 Other fatigue: Secondary | ICD-10-CM

## 2016-09-25 DIAGNOSIS — C3491 Malignant neoplasm of unspecified part of right bronchus or lung: Secondary | ICD-10-CM

## 2016-09-25 DIAGNOSIS — C771 Secondary and unspecified malignant neoplasm of intrathoracic lymph nodes: Secondary | ICD-10-CM

## 2016-09-25 NOTE — Progress Notes (Signed)
[place] [phone]   Fax:([phone]) [place], [place] [address]  DIAGNOSIS: Stage IIIA (T2a, N2, M0) non-small cell lung cancer, squamous cell carcinoma presented with large right upper lobe lung mass with right hilar involvement diagnosed in November 2016.  PRIOR THERAPY:  1) Neoadjuvant systemic chemotherapy with carboplatin for AUC of 6 and paclitaxel 200 MG/M2 every 3 weeks, status post 3 cycles with partial response. 2) Video bronchoscopy, right video-assisted thoracoscopy, minithoracotomy, right upper lobectomy with lymph node dissection on 01/18/2016. The final pathology revealed residual squamous cell carcinoma measuring 3.1 cm with close resection margin and metastatic carcinoma to lymph nodes at levels 10R, 12R and 4R. (ypT2a, yN2).  3) Concurrent chemoradiation with weekly carboplatin for AUC of 2 and paclitaxel 45 MG/M2. First dose 02/27/2016. Status post 5 cycles. Last dose was given 03/26/2016.  CURRENT THERAPY: Observation.  INTERVAL HISTORY: Tammy Ball 75 y.o. female returns to the clinic today for follow-up visit. The patient is feeling much better today. She has improvement of the radiation induced pneumonitis after treatment with steroids. She has no complaints today except for sciatica pain which got worse after she discontinued the steroids.She denied having any nausea or vomiting, no fever or chills. She has no significant shortness of breath, cough or hemoptysis. She had repeat CT scan of the chest performed recently and she is here for evaluation and discussion of her scan results.  MEDICAL HISTORY: Past Medical History:  Diagnosis Date  . Anemia    was while doing chemo  . Anxiety   . Asthma   . Chemotherapy-induced neuropathy ([place]) 11/01/2015  . Claustrophobia   . COPD (chronic obstructive pulmonary disease) ([place])   . Depression   . Encounter for antineoplastic  chemotherapy 02/09/2016  . Glaucoma   . Headache    prior to menopause  . Heart murmur   . History of echocardiogram    Echo 2/17: EF 57-84%, grade 1 diastolic dysfunction, mild MR, trivial pericardial effusion  . History of nuclear stress test    Myoview 2/17: no ischemia or scar, EF 79%; low risk  . Hyperlipidemia   . Hypertension   . Insomnia 05/16/2016  . Non-small cell carcinoma of right lung, stage 2 ([place]) 10/03/2015  . Radiation 02/29/16-04/10/16   50.4 Gy to right central chest  . Renal cell carcinoma ([place])    L nephrectomy  in 2012    ALLERGIES:  is allergic to bee venom; amlodipine; levofloxacin; and hctz [hydrochlorothiazide].  MEDICATIONS:  Current Outpatient Prescriptions  Medication Sig Dispense Refill  . aspirin EC 81 MG tablet Take 81 mg by mouth daily. Reported on 04/11/2016    . atorvastatin (LIPITOR) 40 MG tablet TAKE ONE TABLET BY MOUTH ONCE DAILY. 90 tablet 3  . Cholecalciferol (VITAMIN D3 PO) Take 1 tablet by mouth daily.    . fish oil-omega-3 fatty acids 1000 MG capsule Take 1 capsule by mouth daily.     . fluticasone (FLONASE) 50 MCG/ACT nasal spray Place 2 sprays into both nostrils daily as needed for allergies.     Marland Kitchen LORazepam (ATIVAN) 0.5 MG tablet Take 1 tablet (0.5 mg total) by mouth 2 (two) times daily as needed for anxiety. 10 tablet 1  . polyvinyl alcohol-povidone (REFRESH) 1.4-0.6 % ophthalmic solution Place 2 drops into both eyes daily as needed (for dry eyes). Reported on 05/24/2016    . traMADol (ULTRAM) 50 MG tablet Take 50 mg  by mouth every 6 (six) hours as needed for moderate pain. Reported on 05/24/2016    . valsartan (DIOVAN) 80 MG tablet TAKE ONE TABLET BY MOUTH ONCE DAILY 90 tablet 3  . acetaminophen (TYLENOL) 500 MG tablet Take 500 mg by mouth every 6 (six) hours as needed for mild pain, moderate pain, fever or headache. Reported on 05/24/2016    . albuterol (PROVENTIL HFA;VENTOLIN HFA) 108 (90 BASE) MCG/ACT inhaler Inhale 2 puffs into the lungs  every 4 (four) hours as needed for wheezing or shortness of breath. 1 Inhaler 0  . albuterol (PROVENTIL) (2.5 MG/3ML) 0.083% nebulizer solution Take 2.5 mg by nebulization every 6 (six) hours as needed for wheezing or shortness of breath.    . temazepam (RESTORIL) 15 MG capsule Take 1 capsule (15 mg total) by mouth at bedtime as needed for sleep. (Patient not taking: Reported on 09/25/2016) 30 capsule 0   No current facility-administered medications for this visit.     SURGICAL HISTORY:  Past Surgical History:  Procedure Laterality Date  . BACK SURGERY     cervical 1991  . EYE SURGERY    . kidney cancer    . NEPHRECTOMY    . SPINE SURGERY    . TUBAL LIGATION    . VIDEO ASSISTED THORACOSCOPY (VATS)/WEDGE RESECTION Right 01/18/2016   Procedure: VIDEO ASSISTED THORACOSCOPY (VATS)/LUNG RESECTION, THOROCOTOMY, RIGHT UPPER LOBECTOMY, LYMPH NODE DISSECTION, PLACEMENT OF ON Q;  Surgeon: [name], MD;  Location: [facility];  Service: Thoracic;  Laterality: Right;  Marland Kitchen VIDEO BRONCHOSCOPY Bilateral 09/20/2015   Procedure: VIDEO BRONCHOSCOPY WITHOUT FLUORO;  Surgeon: Rigoberto Noel, MD;  Location: [facility];  Service: Cardiopulmonary;  Laterality: Bilateral;  . VIDEO BRONCHOSCOPY N/A 01/18/2016   Procedure: VIDEO BRONCHOSCOPY;  Surgeon: [name], MD;  Location: [facility] OR;  Service: Thoracic;  Laterality: N/A;    REVIEW OF SYSTEMS:  A comprehensive review of systems was negative except for: Musculoskeletal: positive for arthralgias   PHYSICAL EXAMINATION: General appearance: alert, cooperative, fatigued and no distress Head: Normocephalic, without obvious abnormality, atraumatic Neck: no adenopathy, no JVD, supple, symmetrical, trachea midline and thyroid not enlarged, symmetric, no tenderness/mass/nodules Lymph nodes: Cervical, supraclavicular, and axillary nodes normal. Resp: clear to auscultation bilaterally Back: symmetric, no curvature. ROM normal. No CVA tenderness. Cardio: regular rate  and rhythm, S1, S2 normal, no murmur, click, rub or gallop GI: soft, non-tender; bowel sounds normal; no masses,  no organomegaly Extremities: extremities normal, atraumatic, no cyanosis or edema Neurologic: Alert and oriented X 3, normal strength and tone. Normal symmetric reflexes. Normal coordination and gait  ECOG PERFORMANCE STATUS: 1 - Symptomatic but completely ambulatory  Blood pressure (!) 144/67, pulse (!) 104, temperature 98.9 F (37.2 C), temperature source Oral, resp. rate 17, height '5\' 3"'$  (1.6 m), weight 173 lb 11.2 oz (78.8 kg), SpO2 95 %.  LABORATORY DATA: Lab Results  Component Value Date   WBC 6.0 09/20/2016   HGB 11.8 09/20/2016   HCT 36.6 09/20/2016   MCV 91.7 09/20/2016   PLT 303 09/20/2016      Chemistry      Component Value Date/Time   NA 143 09/20/2016 1208   K 5.3 no hemolysis noted (H) 09/20/2016 1208   CL 103 07/30/2016 2001   CO2 26 09/20/2016 1208   BUN 23.9 09/20/2016 1208   CREATININE 1.1 09/20/2016 1208   GLU 105 12/10/2009      Component Value Date/Time   CALCIUM 10.3 09/20/2016 1208   ALKPHOS 107 09/20/2016  1208   AST 17 09/20/2016 1208   ALT 15 09/20/2016 1208   BILITOT 0.42 09/20/2016 1208       RADIOGRAPHIC STUDIES: Ct Chest W Contrast  Result Date: 09/20/2016 CLINICAL DATA:  Stage IIIA squamous cell lung carcinoma of the right upper lobe diagnosed November 2016 status post neoadjuvant chemotherapy, right upper lobectomy 01/18/2016 and subsequent concurrent chemoradiation therapy. Restaging. EXAM: CT CHEST WITH CONTRAST TECHNIQUE: Multidetector CT imaging of the chest was performed during intravenous contrast administration. CONTRAST:  46m ISOVUE-300 IOPAMIDOL (ISOVUE-300) INJECTION 61% COMPARISON:  06/22/2016 chest CT. FINDINGS: Cardiovascular: Normal heart size. Trace pericardial effusion/ thickening, stable. Left anterior descending coronary atherosclerosis. Atherosclerotic nonaneurysmal thoracic aorta. Normal caliber pulmonary  arteries. No central pulmonary emboli. Mediastinum/Nodes: No discrete thyroid nodules. Unremarkable esophagus. No pathologically enlarged axillary, mediastinal or hilar lymph nodes. Lungs/Pleura: No pneumothorax. No pleural effusion. Status post right upper lobectomy. Mild-to-moderate centrilobular emphysema. Vague ground-glass attenuation and patchy reticulation in the parahilar upper right lung, significantly less prominent, consistent with evolving postradiation change. Left lower lobe solid 10 mm pulmonary nodule (series 4/image 118), in retrospect measuring 6 mm on 06/22/2016, increased. No acute consolidative airspace disease or additional significant pulmonary nodules. Upper abdomen: Small hiatal hernia. Musculoskeletal: Mild superior T3 vertebral compression fracture with associated patchy sclerosis and no discrete bone lesion, new since 06/22/2016. No aggressive appearing focal osseous lesions. Moderate thoracic spondylosis. Partially visualized surgical hardware from ACDF in the lower cervical spine. IMPRESSION: 1. Enlarging solid 10 mm left lower lobe pulmonary nodule, likely a pulmonary metastasis. 2. No definite additional sites of metastatic disease in the chest. 3. New mild superior T3 vertebral compression fracture with associated patchy sclerosis without discrete bone lesion, consistent with an acute/subacute compression fracture. 4. No evidence of local tumor recurrence at the right upper lobectomy site. Decreasing evolving postradiation change in the right upper parahilar lung. 5. Additional findings include aortic atherosclerosis, 1 vessel coronary atherosclerosis, mild to moderate centrilobular emphysema and small hiatal hernia. These results will be called to the ordering clinician or representative by the Radiologist Assistant, and communication documented in the PACS or zVision Dashboard. Electronically Signed   By: JIlona SorrelM.D.   On: 09/20/2016 15:23    ASSESSMENT AND PLAN: This is a  very pleasant 75years old white female recently diagnosed with stage II a non-small cell lung cancer, squamous cell carcinoma and completed neoadjuvant systemic chemotherapy with carboplatin and paclitaxel status post 3 cycles.  The recent CT scan of the chest showed partial response response with significant decrease in the size of the central right upper lobe pulmonary nodule and increased cavitation.  The patient underwent right upper lobectomy with lymph node dissection that showed residual tumor in addition to metastatic disease to the mediastinal lymph nodes and close resection margin. The patient was started on a course of concurrent chemoradiation with weekly carboplatin and paclitaxel status post 5 cycles. She tolerated the 5 cycles well except for mild fatigue.  Her recent CT scan of the chest showed no evidence for disease progression except for the enlarging right lower lobe pulmonary nodule. I discussed the scan results with the patient today. I recommended for her to have a PET scan performed for further evaluation of this pulmonary nodule and suspicious would consider her for stereotactic radiotherapy. She was advised to call immediately if she has any concerning symptoms in the interval. The patient voices understanding of current disease status and treatment options and is in agreement with the current care plan.  All  questions were answered. The patient knows to call the clinic with any problems, questions or concerns. We can certainly see the patient much sooner if necessary.  Disclaimer: This note was dictated with voice recognition software. Similar sounding words can inadvertently be transcribed and may not be corrected upon review.

## 2016-09-25 NOTE — Telephone Encounter (Signed)
Appointments scheduled per 11/14 LOS. Patient given AVS report and calendars with future scheduled appointments. Patient aware of PET scan.

## 2016-10-02 ENCOUNTER — Ambulatory Visit (INDEPENDENT_AMBULATORY_CARE_PROVIDER_SITE_OTHER): Payer: Medicare Other

## 2016-10-02 ENCOUNTER — Encounter: Payer: Self-pay | Admitting: Family Medicine

## 2016-10-02 ENCOUNTER — Ambulatory Visit (INDEPENDENT_AMBULATORY_CARE_PROVIDER_SITE_OTHER): Payer: Medicare Other | Admitting: Family Medicine

## 2016-10-02 VITALS — BP 128/80 | HR 112 | Temp 99.3°F | Resp 18 | Wt 174.4 lb

## 2016-10-02 DIAGNOSIS — M4316 Spondylolisthesis, lumbar region: Secondary | ICD-10-CM

## 2016-10-02 DIAGNOSIS — E875 Hyperkalemia: Secondary | ICD-10-CM

## 2016-10-02 DIAGNOSIS — R000 Tachycardia, unspecified: Secondary | ICD-10-CM | POA: Diagnosis not present

## 2016-10-02 DIAGNOSIS — M5432 Sciatica, left side: Secondary | ICD-10-CM

## 2016-10-02 DIAGNOSIS — I10 Essential (primary) hypertension: Secondary | ICD-10-CM | POA: Diagnosis not present

## 2016-10-02 DIAGNOSIS — M5117 Intervertebral disc disorders with radiculopathy, lumbosacral region: Secondary | ICD-10-CM | POA: Diagnosis not present

## 2016-10-02 DIAGNOSIS — C3491 Malignant neoplasm of unspecified part of right bronchus or lung: Secondary | ICD-10-CM | POA: Diagnosis not present

## 2016-10-02 LAB — CBC WITH DIFFERENTIAL/PLATELET
Basophils Absolute: 82 cells/uL (ref 0–200)
Basophils Relative: 1 %
Eosinophils Absolute: 164 cells/uL (ref 15–500)
Eosinophils Relative: 2 %
HCT: 37.8 % (ref 35.0–45.0)
Hemoglobin: 11.8 g/dL (ref 11.7–15.5)
Lymphocytes Relative: 22 %
Lymphs Abs: 1804 cells/uL (ref 850–3900)
MCH: 29.2 pg (ref 27.0–33.0)
MCHC: 31.2 g/dL — ABNORMAL LOW (ref 32.0–36.0)
MCV: 93.6 fL (ref 80.0–100.0)
MPV: 10.1 fL (ref 7.5–12.5)
Monocytes Absolute: 984 cells/uL — ABNORMAL HIGH (ref 200–950)
Monocytes Relative: 12 %
Neutro Abs: 5166 cells/uL (ref 1500–7800)
Neutrophils Relative %: 63 %
Platelets: 345 10*3/uL (ref 140–400)
RBC: 4.04 MIL/uL (ref 3.80–5.10)
RDW: 17.6 % — ABNORMAL HIGH (ref 11.0–15.0)
WBC: 8.2 10*3/uL (ref 3.8–10.8)

## 2016-10-02 LAB — POCT URINALYSIS DIP (MANUAL ENTRY)
Bilirubin, UA: NEGATIVE
Blood, UA: NEGATIVE
Glucose, UA: NEGATIVE
Ketones, POC UA: NEGATIVE
Leukocytes, UA: NEGATIVE
Nitrite, UA: NEGATIVE
Protein Ur, POC: NEGATIVE
Spec Grav, UA: <=1.005
Urobilinogen, UA: 0.2
pH, UA: 5.5

## 2016-10-02 LAB — COMPREHENSIVE METABOLIC PANEL
ALT: 13 U/L (ref 6–29)
AST: 17 U/L (ref 10–35)
Albumin: 4.4 g/dL (ref 3.6–5.1)
Alkaline Phosphatase: 84 U/L (ref 33–130)
BUN: 22 mg/dL (ref 7–25)
CO2: 26 mmol/L (ref 20–31)
Calcium: 9.7 mg/dL (ref 8.6–10.4)
Chloride: 103 mmol/L (ref 98–110)
Creat: 1.38 mg/dL — ABNORMAL HIGH (ref 0.60–0.93)
Glucose, Bld: 93 mg/dL (ref 65–99)
Potassium: 5 mmol/L (ref 3.5–5.3)
Sodium: 140 mmol/L (ref 135–146)
Total Bilirubin: 0.4 mg/dL (ref 0.2–1.2)
Total Protein: 7.1 g/dL (ref 6.1–8.1)

## 2016-10-02 LAB — TSH: TSH: 1.88 mIU/L

## 2016-10-02 MED ORDER — METOPROLOL SUCCINATE ER 50 MG PO TB24: 50.0000 mg | ORAL_TABLET | Freq: Every day | ORAL | 5 refills | Status: DC

## 2016-10-02 NOTE — Patient Instructions (Signed)
     IF you received an x-ray today, you will receive an invoice from [facility]. Please contact [facility] at [phone] with questions or concerns regarding your invoice.   IF you received labwork today, you will receive an invoice from [facility]. Please contact [facility] at [phone] with questions or concerns regarding your invoice.   Our billing staff will not be able to assist you with questions regarding bills from these companies.  You will be contacted with the lab results as soon as they are available. The fastest way to get your results is to activate your My Chart account. Instructions are located on the last page of this paperwork. If you have not heard from us regarding the results in 2 weeks, please contact this office.      

## 2016-10-02 NOTE — Progress Notes (Signed)
Subjective:    Patient ID: Tammy Ball, female    DOB: Sep 26, 1941, 75 y.o.   MRN: [id number]  10/02/2016  Back Pain (mid to low back pain radiculapathy to left leg x 3 weeks but has been off and on for years)   HPI This 75 y.o. female presents for evaluation of sciatica.   Has been suffering with sciatica and lower back pain since 2011.  Prescribed prednisone in the past with resolution.  Maintained on steroids for pneumonitis without problems.  Having lower and middle back pain.  Compression fracture on T3.  Last MRI in 2014 that revealed mild DDD, anteriolisthesis L4-5.  No previous physical therapy.  Two locations of pain; B lower back and down L leg.  Also having midline pain in middle of back.  Has lost height in the past year.  Has been maintained on prednisone with recent lung cancer treatment. [place] of NS; performed surgery on cervical spine.  Taking Tramadol four times last week; taking Tylenol PRN. Taking temperature at home; no temperature at home.  Lung cancer: Almost doubled in size in 90 days.  S/p recent follow-up with [facility].  Follow-up with [facility] on 10/09/16.    Hyperkalemia of 5.3 on recent labs on 09/20/16.  Previous Amlodipine with swelling; HCTZ caused palpitations.  Had hand swelling with Lisinopril.  Started on Lotrel 5/10 in 2001. Then increased to 5/20.  Then had insurance issue.  Home BP 112/68 76 98%; 10/27 106/74 99 93%;   BP Readings from Last 3 Encounters:  10/02/16 128/80  09/25/16 (!) 144/67  08/29/16 124/80    Wt Readings from Last 3 Encounters:  10/02/16 174 lb 6.4 oz (79.1 kg)  09/25/16 173 lb 11.2 oz (78.8 kg)  08/29/16 168 lb 3.2 oz (76.3 kg)    Review of Systems  Constitutional: Negative for chills, diaphoresis, fatigue and fever.  Eyes: Negative for visual disturbance.  Respiratory: Negative for cough and shortness of breath.   Cardiovascular: Negative for chest pain, palpitations and leg swelling.  Gastrointestinal: Negative for  abdominal pain, constipation, diarrhea, nausea and vomiting.  Endocrine: Negative for cold intolerance, heat intolerance, polydipsia, polyphagia and polyuria.  Genitourinary: Negative for decreased urine volume and difficulty urinating.  Musculoskeletal: Positive for back pain.  Neurological: Positive for numbness. Negative for dizziness, tremors, seizures, syncope, facial asymmetry, speech difficulty, weakness, light-headedness and headaches.    Past Medical History:  Diagnosis Date  . Anemia    was while doing chemo  . Anxiety   . Asthma   . Chemotherapy-induced neuropathy ([place]) 11/01/2015  . Claustrophobia   . COPD (chronic obstructive pulmonary disease) ([place])   . Depression   . Encounter for antineoplastic chemotherapy 02/09/2016  . Glaucoma   . Headache    prior to menopause  . Heart murmur   . History of echocardiogram    Echo 2/17: EF 81-82%, grade 1 diastolic dysfunction, mild MR, trivial pericardial effusion  . History of nuclear stress test    Myoview 2/17: no ischemia or scar, EF 79%; low risk  . Hyperlipidemia   . Hypertension   . Insomnia 05/16/2016  . Non-small cell carcinoma of right lung, stage 2 ([place]) 10/03/2015  . Radiation 02/29/16-04/10/16   50.4 Gy to right central chest  . Renal cell carcinoma ([place])    L nephrectomy  in 2012   Past Surgical History:  Procedure Laterality Date  . BACK SURGERY     cervical 1991  . EYE SURGERY    . kidney  cancer    . NEPHRECTOMY    . SPINE SURGERY    . TUBAL LIGATION    . VIDEO ASSISTED THORACOSCOPY (VATS)/WEDGE RESECTION Right 01/18/2016   Procedure: VIDEO ASSISTED THORACOSCOPY (VATS)/LUNG RESECTION, THOROCOTOMY, RIGHT UPPER LOBECTOMY, LYMPH NODE DISSECTION, PLACEMENT OF ON Q;  Surgeon: [name], MD;  Location: [place];  Service: Thoracic;  Laterality: Right;  Marland Kitchen VIDEO BRONCHOSCOPY Bilateral 09/20/2015   Procedure: VIDEO BRONCHOSCOPY WITHOUT FLUORO;  Surgeon: Rigoberto Noel, MD;  Location: [facility];  Service:  Cardiopulmonary;  Laterality: Bilateral;  . VIDEO BRONCHOSCOPY N/A 01/18/2016   Procedure: VIDEO BRONCHOSCOPY;  Surgeon: [name], MD;  Location: [facility] OR;  Service: Thoracic;  Laterality: N/A;   Allergies  Allergen Reactions  . Bee Venom Anaphylaxis, Shortness Of Breath, Swelling and Other (See Comments)    Swelling at site   . Amlodipine Swelling and Other (See Comments)    Swelling of the ankles and hands   . Levofloxacin Other (See Comments)    Joint pain   . Hctz [Hydrochlorothiazide] Palpitations and Other (See Comments)    Sweating    Current Outpatient Prescriptions  Medication Sig Dispense Refill  . acetaminophen (TYLENOL) 500 MG tablet Take 500 mg by mouth every 6 (six) hours as needed for mild pain, moderate pain, fever or headache. Reported on 05/24/2016    . albuterol (PROVENTIL) (2.5 MG/3ML) 0.083% nebulizer solution Take 2.5 mg by nebulization every 6 (six) hours as needed for wheezing or shortness of breath.    Marland Kitchen aspirin EC 81 MG tablet Take 81 mg by mouth daily. Reported on 04/11/2016    . atorvastatin (LIPITOR) 40 MG tablet TAKE ONE TABLET BY MOUTH ONCE DAILY. 90 tablet 3  . Cholecalciferol (VITAMIN D3 PO) Take 1 tablet by mouth daily.    . fish oil-omega-3 fatty acids 1000 MG capsule Take 1 capsule by mouth daily.     . fluticasone (FLONASE) 50 MCG/ACT nasal spray Place 2 sprays into both nostrils daily as needed for allergies.     Marland Kitchen LORazepam (ATIVAN) 0.5 MG tablet Take 1 tablet (0.5 mg total) by mouth 2 (two) times daily as needed for anxiety. 10 tablet 1  . polyvinyl alcohol-povidone (REFRESH) 1.4-0.6 % ophthalmic solution Place 2 drops into both eyes daily as needed (for dry eyes). Reported on 05/24/2016    . temazepam (RESTORIL) 15 MG capsule Take 1 capsule (15 mg total) by mouth at bedtime as needed for sleep. 30 capsule 0  . traMADol (ULTRAM) 50 MG tablet Take 50 mg by mouth every 6 (six) hours as needed for moderate pain. Reported on 05/24/2016    . valsartan  (DIOVAN) 80 MG tablet TAKE ONE TABLET BY MOUTH ONCE DAILY 90 tablet 3  . albuterol (PROVENTIL HFA;VENTOLIN HFA) 108 (90 BASE) MCG/ACT inhaler Inhale 2 puffs into the lungs every 4 (four) hours as needed for wheezing or shortness of breath. 1 Inhaler 0  . metoprolol succinate (TOPROL-XL) 50 MG 24 hr tablet Take 1 tablet (50 mg total) by mouth daily. Take with or immediately following a meal. 30 tablet 5   No current facility-administered medications for this visit.    Social History   Social History  . Marital status: Widowed    Spouse name: N/A  . Number of children: N/A  . Years of education: N/A   Occupational History  . Not on file.   Social History Main Topics  . Smoking status: Former Smoker    Packs/day: 0.50  Years: 50.00    Types: Cigarettes    Quit date: 03/16/2011  . Smokeless tobacco: Never Used  . Alcohol use No  . Drug use: No  . Sexual activity: Not Currently   Other Topics Concern  . Not on file   Social History Narrative   Marital status: divorced; not dating.      Children: 4 children; 3 grandchildren adult; 4 gg.      Lives: alone in house      Employment: full time substance abuse counselor; [employer].      Tobacco: quit smoking 2012; smoked 45 years      Alcohol: none      Drugs: none      ADLs: independent with ADLs; drives.       Advanced Directives: YES: HCPOA: Nicholas Martinez/son.  FULL CODE but no prolonged measures.      Occupation: Substance Abuse Administrator, [employer]   No exercise** Merged History Encounter **       ** Data from: 12/14/11 Enc Dept: [facility]       ** Data from: 12/17/11 Enc Dept: [facility]   Substance abuse counselor   Husband deceased   4 great grandchildren   Son works in same substance abuse counseling center as patient   Family History  Problem Relation Age of Onset  . Heart disease Sister   . Obesity Brother   . Heart attack Daughter 40    s/p CABG  . Glaucoma  Daughter   . Breast cancer Sister   . Birth defects Sister   . Cancer Mother     Bladder Cancer  . Hypertension Mother   . CAD Mother 87  . Cancer Maternal Grandmother        Objective:    BP 128/80 (BP Location: Left Arm, Cuff Size: Normal)   Pulse (!) 112   Temp 99.3 F (37.4 C) (Oral)   Resp 18   Wt 174 lb 6.4 oz (79.1 kg)   SpO2 95%   BMI 30.89 kg/m  Physical Exam  Constitutional: She is oriented to person, place, and time. She appears well-developed and well-nourished. No distress.  HENT:  Head: Normocephalic and atraumatic.  Right Ear: External ear normal.  Left Ear: External ear normal.  Nose: Nose normal.  Mouth/Throat: Oropharynx is clear and moist.  Eyes: Conjunctivae and EOM are normal. Pupils are equal, round, and reactive to light.  Neck: Normal range of motion. Neck supple. Carotid bruit is not present. No thyromegaly present.  Cardiovascular: Normal rate, regular rhythm, normal heart sounds and intact distal pulses.  Exam reveals no gallop and no friction rub.   No murmur heard. Pulmonary/Chest: Effort normal and breath sounds normal. She has no wheezes. She has no rales.  Abdominal: Soft. Bowel sounds are normal. She exhibits no distension and no mass. There is no tenderness. There is no rebound and no guarding.  Musculoskeletal:       Thoracic back: Normal. She exhibits normal range of motion, no tenderness and no bony tenderness.       Lumbar back: She exhibits pain. She exhibits normal range of motion, no tenderness, no bony tenderness, no spasm and normal pulse.  Lumbar spine:  Non-tender midline; non-tender paraspinal regions B.  Straight leg raises negative B; toe and heel walking intact; marching intact; motor 5/5 BLE.  Full ROM lumbar spine without limitation.   Lymphadenopathy:    She has no cervical  adenopathy.  Neurological: She is alert and oriented to person, place, and time. No cranial nerve deficit.  Skin: Skin is warm and dry. No rash noted.  She is not diaphoretic. No erythema. No pallor.  Psychiatric: She has a normal mood and affect. Her behavior is normal.   Results for orders placed or performed in visit on 09/20/16  CBC with Differential  Result Value Ref Range   WBC 6.0 3.9 - 10.3 10e3/uL   NEUT# 3.7 1.5 - 6.5 10e3/uL   HGB 11.8 11.6 - 15.9 g/dL   HCT 36.6 34.8 - 46.6 %   Platelets 303 145 - 400 10e3/uL   MCV 91.7 79.5 - 101.0 fL   MCH 29.6 25.1 - 34.0 pg   MCHC 32.2 31.5 - 36.0 g/dL   RBC 3.99 3.70 - 5.45 10e6/uL   RDW 17.5 (H) 11.2 - 14.5 %   lymph# 1.6 0.9 - 3.3 10e3/uL   MONO# 0.6 0.1 - 0.9 10e3/uL   Eosinophils Absolute 0.1 0.0 - 0.5 10e3/uL   Basophils Absolute 0.1 0.0 - 0.1 10e3/uL   NEUT% 60.9 38.4 - 76.8 %   LYMPH% 26.3 14.0 - 49.7 %   MONO% 10.5 0.0 - 14.0 %   EOS% 1.5 0.0 - 7.0 %   BASO% 0.8 0.0 - 2.0 %  Comprehensive metabolic panel  Result Value Ref Range   Sodium 143 136 - 145 mEq/L   Potassium 5.3 no hemolysis noted (H) 3.5 - 5.1 mEq/L   Chloride 107 98 - 109 mEq/L   CO2 26 22 - 29 mEq/L   Glucose 100 70 - 140 mg/dl   BUN 23.9 7.0 - 26.0 mg/dL   Creatinine 1.1 0.6 - 1.1 mg/dL   Total Bilirubin 0.42 0.20 - 1.20 mg/dL   Alkaline Phosphatase 107 40 - 150 U/L   AST 17 5 - 34 U/L   ALT 15 0 - 55 U/L   Total Protein 7.6 6.4 - 8.3 g/dL   Albumin 3.8 3.5 - 5.0 g/dL   Calcium 10.3 8.4 - 10.4 mg/dL   Anion Gap 10 3 - 11 mEq/L   EGFR 48 (L) >90 ml/min/1.73 m2       Assessment & Plan:   1. Hyperkalemia   2. Left sided sciatica   3. Non-small cell carcinoma of right lung, stage 2 ([place])   4. Essential hypertension   5. Spondylolisthesis of lumbar region   6. Tachycardia    -start Metoprolol ER 83m one daily for HTN, chronic tachycardia; pt desires to stop ARB due to hyperkalemia. Monitor blood pressure closely. -obtain lumbar spine films today.  Also scheduled to repeat PET scan in upcoming few weeks. If no evidence of metastasis with PET scan in lumbar or thoracic spines, will refer to  ortho and/or obtain MRI lumbar spine. -repeat K today.   Orders Placed This Encounter  Procedures  . Urine culture  . DG Lumbar Spine Complete    Standing Status:   Future    Number of Occurrences:   1    Standing Expiration Date:   10/02/2017    Order Specific Question:   Reason for Exam (SYMPTOM  OR DIAGNOSIS REQUIRED)    Answer:   lower back pain with L radiculopathy    Order Specific Question:   Preferred imaging location?    Answer:   External  . Comprehensive metabolic panel  . CBC with Differential/Platelet  . TSH  . POCT urinalysis dipstick   Meds ordered this encounter  Medications  .  metoprolol succinate (TOPROL-XL) 50 MG 24 hr tablet    Sig: Take 1 tablet (50 mg total) by mouth daily. Take with or immediately following a meal.    Dispense:  30 tablet    Refill:  5    No Follow-up on file.   Bartholomew Ramesh Elayne Guerin, M.D. [facility] [address] [place], [place]  [address] [phone] phone [phone] fax

## 2016-10-03 ENCOUNTER — Ambulatory Visit (HOSPITAL_COMMUNITY)
Admission: RE | Admit: 2016-10-03 | Discharge: 2016-10-03 | Disposition: A | Discharge status: Home / Self Care | Payer: Medicare Other | Source: Ambulatory Visit | Attending: Internal Medicine | Admitting: Internal Medicine

## 2016-10-03 DIAGNOSIS — R911 Solitary pulmonary nodule: Secondary | ICD-10-CM | POA: Diagnosis not present

## 2016-10-03 DIAGNOSIS — Z79899 Other long term (current) drug therapy: Secondary | ICD-10-CM | POA: Insufficient documentation

## 2016-10-03 DIAGNOSIS — I700 Atherosclerosis of aorta: Secondary | ICD-10-CM | POA: Diagnosis not present

## 2016-10-03 DIAGNOSIS — C3411 Malignant neoplasm of upper lobe, right bronchus or lung: Secondary | ICD-10-CM | POA: Diagnosis not present

## 2016-10-03 DIAGNOSIS — C3491 Malignant neoplasm of unspecified part of right bronchus or lung: Secondary | ICD-10-CM | POA: Insufficient documentation

## 2016-10-03 LAB — URINE CULTURE

## 2016-10-03 LAB — GLUCOSE, CAPILLARY: Glucose-Capillary: 94 mg/dL (ref 65–99)

## 2016-10-03 MED ORDER — FLUDEOXYGLUCOSE F - 18 (FDG) INJECTION
8.6700 | Freq: Once | INTRAVENOUS | Status: AC | PRN
  Administered 2016-10-03: 8.67 via INTRAVENOUS

## 2016-10-09 ENCOUNTER — Ambulatory Visit (HOSPITAL_BASED_OUTPATIENT_CLINIC_OR_DEPARTMENT_OTHER): Payer: Medicare Other | Admitting: Internal Medicine

## 2016-10-09 ENCOUNTER — Encounter: Payer: Self-pay | Admitting: Internal Medicine

## 2016-10-09 ENCOUNTER — Encounter: Payer: Self-pay | Admitting: Family Medicine

## 2016-10-09 ENCOUNTER — Telehealth: Payer: Self-pay | Admitting: Internal Medicine

## 2016-10-09 VITALS — BP 160/76 | HR 79 | Temp 98.5°F | Resp 18 | Wt 176.3 lb

## 2016-10-09 DIAGNOSIS — R911 Solitary pulmonary nodule: Secondary | ICD-10-CM

## 2016-10-09 DIAGNOSIS — C771 Secondary and unspecified malignant neoplasm of intrathoracic lymph nodes: Secondary | ICD-10-CM | POA: Diagnosis not present

## 2016-10-09 DIAGNOSIS — C3411 Malignant neoplasm of upper lobe, right bronchus or lung: Secondary | ICD-10-CM

## 2016-10-09 DIAGNOSIS — C3491 Malignant neoplasm of unspecified part of right bronchus or lung: Secondary | ICD-10-CM

## 2016-10-09 NOTE — Progress Notes (Signed)
[facility] Telephone:(336) [phone]   Fax:(336) [place], [place] [address]  DIAGNOSIS: Stage IIIA (T2a, N2, M0) non-small cell lung cancer, squamous cell carcinoma presented with large right upper lobe lung mass with right hilar involvement diagnosed in November 2016.  PRIOR THERAPY:  1) Neoadjuvant systemic chemotherapy with carboplatin for AUC of 6 and paclitaxel 200 MG/M2 every 3 weeks, status post 3 cycles with partial response. 2) Video bronchoscopy, right video-assisted thoracoscopy, minithoracotomy, right upper lobectomy with lymph node dissection on 01/18/2016. The final pathology revealed residual squamous cell carcinoma measuring 3.1 cm with close resection margin and metastatic carcinoma to lymph nodes at levels 10R, 12R and 4R. (ypT2a, yN2).  3) Concurrent chemoradiation with weekly carboplatin for AUC of 2 and paclitaxel 45 MG/M2. First dose 02/27/2016. Status post 5 cycles. Last dose was given 03/26/2016.  CURRENT THERAPY: Observation.  INTERVAL HISTORY: Tammy Ball 76 y.o. female returns to the clinic today for follow-up visit accompanied by her son. The patient is feeling much better today. She denied having any nausea or vomiting, no fever or chills. She has no significant shortness of breath, cough or hemoptysis. She was found on previous CT scan of the chest to have suspicious enlarging left lower lobe pulmonary nodule. I ordered a PET scan and the patient is here today for evaluation and discussion of her PET scan results and further recommendation regarding treatment of her condition.  MEDICAL HISTORY: Past Medical History:  Diagnosis Date  . Anemia    was while doing chemo  . Anxiety   . Asthma   . Chemotherapy-induced neuropathy ([facility]) 11/01/2015  . Claustrophobia   . COPD (chronic obstructive pulmonary disease) ([facility])   . Depression   . Encounter for antineoplastic chemotherapy  02/09/2016  . Glaucoma   . Headache    prior to menopause  . Heart murmur   . History of echocardiogram    Echo 2/17: EF 62-03%, grade 1 diastolic dysfunction, mild MR, trivial pericardial effusion  . History of nuclear stress test    Myoview 2/17: no ischemia or scar, EF 79%; low risk  . Hyperlipidemia   . Hypertension   . Insomnia 05/16/2016  . Non-small cell carcinoma of right lung, stage 2 ([facility]) 10/03/2015  . Radiation 02/29/16-04/10/16   50.4 Gy to right central chest  . Renal cell carcinoma ([facility])    L nephrectomy  in 2012    ALLERGIES:  is allergic to bee venom; amlodipine; levofloxacin; and hctz [hydrochlorothiazide].  MEDICATIONS:  Current Outpatient Prescriptions  Medication Sig Dispense Refill  . acetaminophen (TYLENOL) 500 MG tablet Take 500 mg by mouth every 6 (six) hours as needed for mild pain, moderate pain, fever or headache. Reported on 05/24/2016    . albuterol (PROVENTIL) (2.5 MG/3ML) 0.083% nebulizer solution Take 2.5 mg by nebulization every 6 (six) hours as needed for wheezing or shortness of breath.    Marland Kitchen aspirin EC 81 MG tablet Take 81 mg by mouth daily. Reported on 04/11/2016    . atorvastatin (LIPITOR) 40 MG tablet TAKE ONE TABLET BY MOUTH ONCE DAILY. 90 tablet 3  . Cholecalciferol (VITAMIN D3 PO) Take 1 tablet by mouth daily.    . fish oil-omega-3 fatty acids 1000 MG capsule Take 1 capsule by mouth daily.     . fluticasone (FLONASE) 50 MCG/ACT nasal spray Place 2 sprays into both nostrils daily as needed for allergies.     Marland Kitchen  LORazepam (ATIVAN) 0.5 MG tablet Take 1 tablet (0.5 mg total) by mouth 2 (two) times daily as needed for anxiety. 10 tablet 1  . metoprolol succinate (TOPROL-XL) 50 MG 24 hr tablet Take 1 tablet (50 mg total) by mouth daily. Take with or immediately following a meal. 30 tablet 5  . polyvinyl alcohol-povidone (REFRESH) 1.4-0.6 % ophthalmic solution Place 2 drops into both eyes daily as needed (for dry eyes). Reported on 05/24/2016    . albuterol  (PROVENTIL HFA;VENTOLIN HFA) 108 (90 BASE) MCG/ACT inhaler Inhale 2 puffs into the lungs every 4 (four) hours as needed for wheezing or shortness of breath. 1 Inhaler 0  . temazepam (RESTORIL) 15 MG capsule Take 1 capsule (15 mg total) by mouth at bedtime as needed for sleep. (Patient not taking: Reported on 10/09/2016) 30 capsule 0  . traMADol (ULTRAM) 50 MG tablet Take 50 mg by mouth every 6 (six) hours as needed for moderate pain. Reported on 05/24/2016     No current facility-administered medications for this visit.     SURGICAL HISTORY:  Past Surgical History:  Procedure Laterality Date  . BACK SURGERY     cervical 1991  . EYE SURGERY    . kidney cancer    . NEPHRECTOMY    . SPINE SURGERY    . TUBAL LIGATION    . VIDEO ASSISTED THORACOSCOPY (VATS)/WEDGE RESECTION Right 01/18/2016   Procedure: VIDEO ASSISTED THORACOSCOPY (VATS)/LUNG RESECTION, THOROCOTOMY, RIGHT UPPER LOBECTOMY, LYMPH NODE DISSECTION, PLACEMENT OF ON Q;  Surgeon: [name], MD;  Location: [facility];  Service: Thoracic;  Laterality: Right;  Marland Kitchen VIDEO BRONCHOSCOPY Bilateral 09/20/2015   Procedure: VIDEO BRONCHOSCOPY WITHOUT FLUORO;  Surgeon: Rigoberto Noel, MD;  Location: [facility];  Service: Cardiopulmonary;  Laterality: Bilateral;  . VIDEO BRONCHOSCOPY N/A 01/18/2016   Procedure: VIDEO BRONCHOSCOPY;  Surgeon: [name], MD;  Location: [facility] OR;  Service: Thoracic;  Laterality: N/A;    REVIEW OF SYSTEMS:  A comprehensive review of systems was negative except for: Musculoskeletal: positive for arthralgias   PHYSICAL EXAMINATION: General appearance: alert, cooperative, fatigued and no distress Head: Normocephalic, without obvious abnormality, atraumatic Neck: no adenopathy, no JVD, supple, symmetrical, trachea midline and thyroid not enlarged, symmetric, no tenderness/mass/nodules Lymph nodes: Cervical, supraclavicular, and axillary nodes normal. Resp: clear to auscultation bilaterally Back: symmetric, no  curvature. ROM normal. No CVA tenderness. Cardio: regular rate and rhythm, S1, S2 normal, no murmur, click, rub or gallop GI: soft, non-tender; bowel sounds normal; no masses,  no organomegaly Extremities: extremities normal, atraumatic, no cyanosis or edema Neurologic: Alert and oriented X 3, normal strength and tone. Normal symmetric reflexes. Normal coordination and gait  ECOG PERFORMANCE STATUS: 1 - Symptomatic but completely ambulatory  Blood pressure (!) 160/76, pulse 79, temperature 98.5 F (36.9 C), temperature source Oral, resp. rate 18, weight 176 lb 4.8 oz (80 kg), SpO2 94 %.  LABORATORY DATA: Lab Results  Component Value Date   WBC 8.2 10/02/2016   HGB 11.8 10/02/2016   HCT 37.8 10/02/2016   MCV 93.6 10/02/2016   PLT 345 10/02/2016      Chemistry      Component Value Date/Time   NA 140 10/02/2016 1356   NA 143 09/20/2016 1208   K 5.0 10/02/2016 1356   K 5.3 no hemolysis noted (H) 09/20/2016 1208   CL 103 10/02/2016 1356   CO2 26 10/02/2016 1356   CO2 26 09/20/2016 1208   BUN 22 10/02/2016 1356   BUN 23.9 09/20/2016  1208   CREATININE 1.38 (H) 10/02/2016 1356   CREATININE 1.1 09/20/2016 1208   GLU 105 12/10/2009      Component Value Date/Time   CALCIUM 9.7 10/02/2016 1356   CALCIUM 10.3 09/20/2016 1208   ALKPHOS 84 10/02/2016 1356   ALKPHOS 107 09/20/2016 1208   AST 17 10/02/2016 1356   AST 17 09/20/2016 1208   ALT 13 10/02/2016 1356   ALT 15 09/20/2016 1208   BILITOT 0.4 10/02/2016 1356   BILITOT 0.42 09/20/2016 1208       RADIOGRAPHIC STUDIES: Dg Lumbar Spine Complete  Result Date: 10/02/2016 CLINICAL DATA:  Lower back pain with left radiculopathy. EXAM: LUMBAR SPINE - COMPLETE 4+ VIEW COMPARISON:  MRI 07/11/2013. FINDINGS: Again noted is anterolisthesis at L4-L5. There is approximately 7 mm of anterolisthesis at L4-L5 and previously there was roughly 4 mm. Extensive facet arthropathy in the lower lumbar spine. Mild disc space narrowing at L4-L5.  Moderate disc space narrowing at L5-S1. The vertebral body heights are maintained. Extensive atherosclerotic calcifications involving the abdominal aorta. No evidence for a pars defect. IMPRESSION: Progression of the anterolisthesis at L4-L5 which appears to be secondary to facet arthropathy. Disc space narrowing and disease at L5-S1. Aortic atherosclerosis. Electronically Signed   By: Markus Daft M.D.   On: 10/02/2016 15:03   Ct Chest W Contrast  Result Date: 09/20/2016 CLINICAL DATA:  Stage IIIA squamous cell lung carcinoma of the right upper lobe diagnosed November 2016 status post neoadjuvant chemotherapy, right upper lobectomy 01/18/2016 and subsequent concurrent chemoradiation therapy. Restaging. EXAM: CT CHEST WITH CONTRAST TECHNIQUE: Multidetector CT imaging of the chest was performed during intravenous contrast administration. CONTRAST:  40m ISOVUE-300 IOPAMIDOL (ISOVUE-300) INJECTION 61% COMPARISON:  06/22/2016 chest CT. FINDINGS: Cardiovascular: Normal heart size. Trace pericardial effusion/ thickening, stable. Left anterior descending coronary atherosclerosis. Atherosclerotic nonaneurysmal thoracic aorta. Normal caliber pulmonary arteries. No central pulmonary emboli. Mediastinum/Nodes: No discrete thyroid nodules. Unremarkable esophagus. No pathologically enlarged axillary, mediastinal or hilar lymph nodes. Lungs/Pleura: No pneumothorax. No pleural effusion. Status post right upper lobectomy. Mild-to-moderate centrilobular emphysema. Vague ground-glass attenuation and patchy reticulation in the parahilar upper right lung, significantly less prominent, consistent with evolving postradiation change. Left lower lobe solid 10 mm pulmonary nodule (series 4/image 118), in retrospect measuring 6 mm on 06/22/2016, increased. No acute consolidative airspace disease or additional significant pulmonary nodules. Upper abdomen: Small hiatal hernia. Musculoskeletal: Mild superior T3 vertebral compression fracture  with associated patchy sclerosis and no discrete bone lesion, new since 06/22/2016. No aggressive appearing focal osseous lesions. Moderate thoracic spondylosis. Partially visualized surgical hardware from ACDF in the lower cervical spine. IMPRESSION: 1. Enlarging solid 10 mm left lower lobe pulmonary nodule, likely a pulmonary metastasis. 2. No definite additional sites of metastatic disease in the chest. 3. New mild superior T3 vertebral compression fracture with associated patchy sclerosis without discrete bone lesion, consistent with an acute/subacute compression fracture. 4. No evidence of local tumor recurrence at the right upper lobectomy site. Decreasing evolving postradiation change in the right upper parahilar lung. 5. Additional findings include aortic atherosclerosis, 1 vessel coronary atherosclerosis, mild to moderate centrilobular emphysema and small hiatal hernia. These results will be called to the ordering clinician or representative by the Radiologist Assistant, and communication documented in the PACS or zVision Dashboard. Electronically Signed   By: JIlona SorrelM.D.   On: 09/20/2016 15:23   Nm Pet Image Restag (ps) Skull Base To Thigh  Result Date: 10/03/2016 CLINICAL DATA:  Subsequent treatment strategy for stage IIIA squamous  cell carcinoma of the right upper lobe, status post right upper lobectomy and lymph node dissection showing metastatic disease to mediastinal lymph nodes. Now with enlarging right lower lobe pulmonary nodule. EXAM: NUCLEAR MEDICINE PET SKULL BASE TO THIGH TECHNIQUE: 8.7 mCi F-18 FDG was injected intravenously. Full-ring PET imaging was performed from the skull base to thigh after the radiotracer. CT data was obtained and used for attenuation correction and anatomic localization. FASTING BLOOD GLUCOSE:  Value: 94 mg/dl COMPARISON:  Chest CT 09/20/2016.  PET-CT 09/29/2015 FINDINGS: NECK No hypermetabolic lymph nodes in the neck. CHEST Mottled uptake is identified in  the mediastinum in each hilum without a discrete hypermetabolic lymph node evident. Linear band of uptake identified along the right lateral chest wall is in the region of prior surgery (fourth intercostal space) and likely reflects granulation/healing from the surgery. This could be monitored on follow-up imaging. There is no evidence for mass lesion at this level. Lung windows show the posterior left lower lobe pulmonary nodule which was 10 mm in size on the previous diagnostic CT about 2 weeks ago. This nodule is hypermetabolic today with SUV max = 5.0. ABDOMEN/PELVIS No abnormal hypermetabolic activity within the liver, pancreas, adrenal glands, or spleen. No hypermetabolic lymph nodes in the abdomen or pelvis. Abdominal aortic atherosclerosis noted without aneurysm. SKELETON No focal hypermetabolic activity to suggest skeletal metastasis. IMPRESSION: 1. 10 mm hypermetabolic posterior left lower lobe pulmonary nodule. Given the uptake relative to the small size, this nodule is most likely neoplastic. Primary consideration would be metastatic disease from known malignancy. Metachronous lung cancer would also be a consideration. 2. No evidence for hypermetabolic metastatic disease in the neck, abdomen, or pelvis. 3. Abdominal aortic atherosclerosis Electronically Signed   By: Misty Stanley M.D.   On: 10/03/2016 15:10    ASSESSMENT AND PLAN: This is a very pleasant 75 years old white female recently diagnosed with stage IIA non-small cell lung cancer, squamous cell carcinoma and completed neoadjuvant systemic chemotherapy with carboplatin and paclitaxel status post 3 cycles.  The recent CT scan of the chest showed partial response response with significant decrease in the size of the central right upper lobe pulmonary nodule and increased cavitation.  The patient underwent right upper lobectomy with lymph node dissection that showed residual tumor in addition to metastatic disease to the mediastinal lymph  nodes and close resection margin. The patient was started on a course of concurrent chemoradiation with weekly carboplatin and paclitaxel status post 5 cycles. She tolerated the 5 cycles well except for mild fatigue.  Her recent CT scan of the chest showed no evidence for disease progression except for the enlarging right lower lobe pulmonary nodule. I ordered a PET scan which showed 1.0 cm hypermetabolic posterior left lower lobe pulmonary nodule suspicious for metastatic disease from her known primary lung cancer versus metachronous lung primary. No other evidence of metastatic disease. I discussed the scan results and showed the images to the patient and her son. I recommended for her to see Dr. Sondra Come for consideration of stereotactic body radiotherapy but will also discuss the patient his case at the weekly thoracic conference to see if she would be considered for surgical resection by cardiothoracic surgery. I will see her back for follow-up visit in 4 months for reevaluation with repeat CT scan of the chest for restaging of her disease after treatment of the left lower lobe pulmonary nodule. She was advised to call immediately if she has any concerning symptoms in the interval. The patient  voices understanding of current disease status and treatment options and is in agreement with the current care plan.  All questions were answered. The patient knows to call the clinic with any problems, questions or concerns. We can certainly see the patient much sooner if necessary.  Disclaimer: This note was dictated with voice recognition software. Similar sounding words can inadvertently be transcribed and may not be corrected upon review.

## 2016-10-09 NOTE — Telephone Encounter (Signed)
Spoke with [facility] @ rad/onc. Rad/Onc appointment was scheduled for 10/17/16 @ 8:30 a.m. Patient is aware. Appointments scheduled per 10/09/16 los. A copy of the AVS report and appointment schedule was given to the patient, per 10/09/16 los.

## 2016-10-12 NOTE — Progress Notes (Signed)
Thoracic Location of Tumor / Histology: left lower lobe nodule  Patient presented after a PET scan on 10/03/16 which showed 1.0 cm hypermetabolic posterior left lower lobe pulmonary nodule suspicious for metastatic disease from her known primary lung cancer versus metachronous lung primary.  Tobacco/Marijuana/Snuff/ETOH use: quit smoking in 2012.  Smoked for 40 years.  Denies ETOH and marijuana use.  Past/Anticipated interventions by cardiothoracic surgery, if any: wants to try radiation first.  Past/Anticipated interventions by medical oncology, if any: Neoadjuvant systemic chemotherapy with carboplatin for AUC of 6 and paclitaxel 200 MG/M2 every 3 weeks, status post 3 cycles with partial response.  Concurrent chemoradiation with weekly carboplatin for AUC of 2 and paclitaxel 45 MG/M2. First dose 02/27/2016. Status post 5 cycles. Last dose was given 03/26/2016.  Signs/Symptoms  Weight changes, if any: no  Respiratory complaints, if any: yes- reports having a productive cough with yellow/green sputum.  Reports she has a cold and had a 99.8 degree temperature yesterday.  She reports having chills yesterday.  Hemoptysis, if any: no  Pain issues, if any:  Yes- has chronic mid back pain.  SAFETY ISSUES:  Prior radiation? Yes - 02/29/16-04/10/16 50.4 Gy to right central chest  Pacemaker/ICD? no   Possible current pregnancy?no  Is the patient on methotrexate? no  Current Complaints / other details:  Dr. Julien Nordmann is recommending "stereotactic body radiotherapy but will also discuss the patient his case at the weekly thoracic conference to see if she would be considered for surgical resection by cardiothoracic surgery."  BP 137/68 (BP Location: Left Arm, Patient Position: Sitting)   Pulse 75   Temp 98.2 F (36.8 C) (Oral)   SpO2 95%    Wt Readings from Last 3 Encounters:  10/09/16 176 lb 4.8 oz (80 kg)  10/02/16 174 lb 6.4 oz (79.1 kg)  09/25/16 173 lb 11.2 oz (78.8 kg)

## 2016-10-17 ENCOUNTER — Encounter: Payer: Self-pay | Admitting: Radiation Oncology

## 2016-10-17 ENCOUNTER — Ambulatory Visit
Admission: RE | Admit: 2016-10-17 | Discharge: 2016-10-17 | Disposition: A | Discharge status: Home / Self Care | Payer: Medicare Other | Source: Ambulatory Visit | Attending: Radiation Oncology | Admitting: Radiation Oncology

## 2016-10-17 VITALS — BP 137/68 | HR 75 | Temp 98.2°F

## 2016-10-17 DIAGNOSIS — C3491 Malignant neoplasm of unspecified part of right bronchus or lung: Secondary | ICD-10-CM

## 2016-10-17 DIAGNOSIS — C7802 Secondary malignant neoplasm of left lung: Secondary | ICD-10-CM | POA: Insufficient documentation

## 2016-10-17 DIAGNOSIS — M546 Pain in thoracic spine: Secondary | ICD-10-CM | POA: Diagnosis not present

## 2016-10-17 DIAGNOSIS — Z510 Encounter for antineoplastic radiation therapy: Secondary | ICD-10-CM | POA: Insufficient documentation

## 2016-10-17 DIAGNOSIS — Z881 Allergy status to other antibiotic agents status: Secondary | ICD-10-CM | POA: Insufficient documentation

## 2016-10-17 DIAGNOSIS — Z7951 Long term (current) use of inhaled steroids: Secondary | ICD-10-CM | POA: Diagnosis not present

## 2016-10-17 DIAGNOSIS — C3431 Malignant neoplasm of lower lobe, right bronchus or lung: Secondary | ICD-10-CM | POA: Insufficient documentation

## 2016-10-17 DIAGNOSIS — Z79899 Other long term (current) drug therapy: Secondary | ICD-10-CM | POA: Diagnosis not present

## 2016-10-17 DIAGNOSIS — G8929 Other chronic pain: Secondary | ICD-10-CM | POA: Insufficient documentation

## 2016-10-17 DIAGNOSIS — Z7982 Long term (current) use of aspirin: Secondary | ICD-10-CM | POA: Diagnosis not present

## 2016-10-17 DIAGNOSIS — Z888 Allergy status to other drugs, medicaments and biological substances status: Secondary | ICD-10-CM | POA: Insufficient documentation

## 2016-10-17 DIAGNOSIS — C3432 Malignant neoplasm of lower lobe, left bronchus or lung: Secondary | ICD-10-CM | POA: Diagnosis not present

## 2016-10-17 DIAGNOSIS — Z85118 Personal history of other malignant neoplasm of bronchus and lung: Secondary | ICD-10-CM | POA: Diagnosis not present

## 2016-10-17 DIAGNOSIS — Z923 Personal history of irradiation: Secondary | ICD-10-CM | POA: Diagnosis not present

## 2016-10-17 NOTE — Progress Notes (Signed)
Radiation Oncology         [phone] ________________________________  Name: Tammy Ball MRN: [id number]  Date: 10/17/2016  DOB: 11-18-1940  Re-Consultation Visit Note  CC: Reginia Forts, MD  Curt Bears, MD    ICD-9-CM ICD-10-CM   1. Non-small cell carcinoma of right lung, stage 2 ([place]) 162.9 C34.91     Diagnosis:  Stage IIIA (ypT2a, pN2, M0) non-small cell carcinoma (squamous cell carcinoma) of the right lower lung, now with a solitary left lower lobe lung nodule ([place] metastasis)  Interval Since Last Radiation: 7 months  02/29/2016-04/10/2016: 50.4 Gy in 28 fractions to the right central chest  Narrative:  The patient returns today for a re-consultation. CT scan of the chest on 09/20/16 showed an enlarging solid 1 cm left lower lobe pulmonary nodule that is likely a pulmonary metastasis (was 0.6 cm on 06/22/16), no definite additional sites of metastatic disease in the chest, and no evidence of local tumor recurrence at the right upper lobectomy site with decreasing evolving postradiation change in the right upper parahilar lung. PET scan on 10/03/16 showed a hypermetabolic 1 cm left lower lobe pulmonary nodule with SUV max 5.0.  The patient followed up with Dr. Julien Nordmann on 10/09/16 who recommended stereotactic body radiotherapy to the left lower lobe pulmonary nodule. However, he will also discuss the patient's case at the weekly thoracic conference to see if she would be a candidate for surgical resection by cardiothoracic surgery.  On review of systems: The patient reports a productive cough with yellow/green sputum. She reports a cold and a 99.8 degree temperature yesterday with chills. She also reports chronic mid back pain.  ALLERGIES:  is allergic to bee venom; amlodipine; levofloxacin; and hctz [hydrochlorothiazide].  Meds: Current Outpatient Prescriptions  Medication Sig Dispense Refill  . acetaminophen (TYLENOL) 500 MG tablet Take 500 mg by mouth every 6 (six)  hours as needed for mild pain, moderate pain, fever or headache. Reported on 05/24/2016    . aspirin EC 81 MG tablet Take 81 mg by mouth daily. Reported on 04/11/2016    . atorvastatin (LIPITOR) 40 MG tablet TAKE ONE TABLET BY MOUTH ONCE DAILY. 90 tablet 3  . Cholecalciferol (VITAMIN D3 PO) Take 1 tablet by mouth daily.    . fish oil-omega-3 fatty acids 1000 MG capsule Take 1 capsule by mouth daily.     . fluticasone (FLONASE) 50 MCG/ACT nasal spray Place 2 sprays into both nostrils daily as needed for allergies.     . metoprolol succinate (TOPROL-XL) 50 MG 24 hr tablet Take 1 tablet (50 mg total) by mouth daily. Take with or immediately following a meal. 30 tablet 5  . polyvinyl alcohol-povidone (REFRESH) 1.4-0.6 % ophthalmic solution Place 2 drops into both eyes daily as needed (for dry eyes). Reported on 05/24/2016    . traMADol (ULTRAM) 50 MG tablet Take 50 mg by mouth every 6 (six) hours as needed for moderate pain. Reported on 05/24/2016    . albuterol (PROVENTIL HFA;VENTOLIN HFA) 108 (90 BASE) MCG/ACT inhaler Inhale 2 puffs into the lungs every 4 (four) hours as needed for wheezing or shortness of breath. 1 Inhaler 0  . albuterol (PROVENTIL) (2.5 MG/3ML) 0.083% nebulizer solution Take 2.5 mg by nebulization every 6 (six) hours as needed for wheezing or shortness of breath.    Marland Kitchen LORazepam (ATIVAN) 0.5 MG tablet Take 1 tablet (0.5 mg total) by mouth 2 (two) times daily as needed for anxiety. (Patient not taking: Reported on 10/17/2016) 10 tablet 1  .  temazepam (RESTORIL) 15 MG capsule Take 1 capsule (15 mg total) by mouth at bedtime as needed for sleep. (Patient not taking: Reported on 10/17/2016) 30 capsule 0   No current facility-administered medications for this encounter.     Physical Findings: The patient is in no acute distress. Patient is alert and oriented.  oral temperature is 98.2 F (36.8 C). Her blood pressure is 137/68 and her pulse is 75. Her oxygen saturation is 95%.   Lungs, some  mild wheezing bilaterally. Heart has regular rate and rhythm. No palpable cervical, supraclavicular, or axillary adenopathy.  Lab Findings: Lab Results  Component Value Date   WBC 8.2 10/02/2016   HGB 11.8 10/02/2016   HCT 37.8 10/02/2016   MCV 93.6 10/02/2016   PLT 345 10/02/2016    Radiographic Findings: Dg Lumbar Spine Complete  Result Date: 10/02/2016 CLINICAL DATA:  Lower back pain with left radiculopathy. EXAM: LUMBAR SPINE - COMPLETE 4+ VIEW COMPARISON:  MRI 07/11/2013. FINDINGS: Again noted is anterolisthesis at L4-L5. There is approximately 7 mm of anterolisthesis at L4-L5 and previously there was roughly 4 mm. Extensive facet arthropathy in the lower lumbar spine. Mild disc space narrowing at L4-L5. Moderate disc space narrowing at L5-S1. The vertebral body heights are maintained. Extensive atherosclerotic calcifications involving the abdominal aorta. No evidence for a pars defect. IMPRESSION: Progression of the anterolisthesis at L4-L5 which appears to be secondary to facet arthropathy. Disc space narrowing and disease at L5-S1. Aortic atherosclerosis. Electronically Signed   By: Markus Daft M.D.   On: 10/02/2016 15:03   Ct Chest W Contrast  Result Date: 09/20/2016 CLINICAL DATA:  Stage IIIA squamous cell lung carcinoma of the right upper lobe diagnosed November 2016 status post neoadjuvant chemotherapy, right upper lobectomy 01/18/2016 and subsequent concurrent chemoradiation therapy. Restaging. EXAM: CT CHEST WITH CONTRAST TECHNIQUE: Multidetector CT imaging of the chest was performed during intravenous contrast administration. CONTRAST:  89m ISOVUE-300 IOPAMIDOL (ISOVUE-300) INJECTION 61% COMPARISON:  06/22/2016 chest CT. FINDINGS: Cardiovascular: Normal heart size. Trace pericardial effusion/ thickening, stable. Left anterior descending coronary atherosclerosis. Atherosclerotic nonaneurysmal thoracic aorta. Normal caliber pulmonary arteries. No central pulmonary emboli.  Mediastinum/Nodes: No discrete thyroid nodules. Unremarkable esophagus. No pathologically enlarged axillary, mediastinal or hilar lymph nodes. Lungs/Pleura: No pneumothorax. No pleural effusion. Status post right upper lobectomy. Mild-to-moderate centrilobular emphysema. Vague ground-glass attenuation and patchy reticulation in the parahilar upper right lung, significantly less prominent, consistent with evolving postradiation change. Left lower lobe solid 10 mm pulmonary nodule (series 4/image 118), in retrospect measuring 6 mm on 06/22/2016, increased. No acute consolidative airspace disease or additional significant pulmonary nodules. Upper abdomen: Small hiatal hernia. Musculoskeletal: Mild superior T3 vertebral compression fracture with associated patchy sclerosis and no discrete bone lesion, new since 06/22/2016. No aggressive appearing focal osseous lesions. Moderate thoracic spondylosis. Partially visualized surgical hardware from ACDF in the lower cervical spine. IMPRESSION: 1. Enlarging solid 10 mm left lower lobe pulmonary nodule, likely a pulmonary metastasis. 2. No definite additional sites of metastatic disease in the chest. 3. New mild superior T3 vertebral compression fracture with associated patchy sclerosis without discrete bone lesion, consistent with an acute/subacute compression fracture. 4. No evidence of local tumor recurrence at the right upper lobectomy site. Decreasing evolving postradiation change in the right upper parahilar lung. 5. Additional findings include aortic atherosclerosis, 1 vessel coronary atherosclerosis, mild to moderate centrilobular emphysema and small hiatal hernia. These results will be called to the ordering clinician or representative by the Radiologist Assistant, and communication documented in the PACS  or zVision Dashboard. Electronically Signed   By: Ilona Sorrel M.D.   On: 09/20/2016 15:23   Nm Pet Image Restag (ps) Skull Base To Thigh  Result Date:  10/03/2016 CLINICAL DATA:  Subsequent treatment strategy for stage IIIA squamous cell carcinoma of the right upper lobe, status post right upper lobectomy and lymph node dissection showing metastatic disease to mediastinal lymph nodes. Now with enlarging right lower lobe pulmonary nodule. EXAM: NUCLEAR MEDICINE PET SKULL BASE TO THIGH TECHNIQUE: 8.7 mCi F-18 FDG was injected intravenously. Full-ring PET imaging was performed from the skull base to thigh after the radiotracer. CT data was obtained and used for attenuation correction and anatomic localization. FASTING BLOOD GLUCOSE:  Value: 94 mg/dl COMPARISON:  Chest CT 09/20/2016.  PET-CT 09/29/2015 FINDINGS: NECK No hypermetabolic lymph nodes in the neck. CHEST Mottled uptake is identified in the mediastinum in each hilum without a discrete hypermetabolic lymph node evident. Linear band of uptake identified along the right lateral chest wall is in the region of prior surgery (fourth intercostal space) and likely reflects granulation/healing from the surgery. This could be monitored on follow-up imaging. There is no evidence for mass lesion at this level. Lung windows show the posterior left lower lobe pulmonary nodule which was 10 mm in size on the previous diagnostic CT about 2 weeks ago. This nodule is hypermetabolic today with SUV max = 5.0. ABDOMEN/PELVIS No abnormal hypermetabolic activity within the liver, pancreas, adrenal glands, or spleen. No hypermetabolic lymph nodes in the abdomen or pelvis. Abdominal aortic atherosclerosis noted without aneurysm. SKELETON No focal hypermetabolic activity to suggest skeletal metastasis. IMPRESSION: 1. 10 mm hypermetabolic posterior left lower lobe pulmonary nodule. Given the uptake relative to the small size, this nodule is most likely neoplastic. Primary consideration would be metastatic disease from known malignancy. Metachronous lung cancer would also be a consideration. 2. No evidence for hypermetabolic metastatic  disease in the neck, abdomen, or pelvis. 3. Abdominal aortic atherosclerosis Electronically Signed   By: Misty Stanley M.D.   On: 10/03/2016 15:10    Impression: Stage IIIA (ypT2a, pN2, M0) non-small cell carcinoma (squamous cell carcinoma) of the right lower lung, now with a solitary left lower lobe lung nodule  The patient had a restaging CT scan in early November that noted an enlarging 1.0 cm left lower lung pulmonary nodule that was 0.6 cm in August. PET scan confirmed that this nodule is hypermetabolic with SUV max 5.0, suggesting that this is a pulmonary metastasis. We discussed that the patient would be a good candidate for SBRT to control this solitary nodule.  We discussed that SBRT with provide local control similar to that of surgery. We discussed that she may fail in the mediastinum hilum or elsewhere in the body and radiation is a local only process. We discussed the process of simulation and the use of a pad all to decrease respiratory motion. We discussed the use of 4-dimensional simulation to minimize normal lung tissue treated and the use of respiratory compression. We discussed 3- 5 treatments occurring every other day as an outpatient. We discussed these treatments would last about 10-20 minutes and he would be here at the hospital for about an hour. We discussed that SBRT was unlikely to make her breathing any worse. It is also unlikely to make her breathing symptoms any better. We discussed possible side effects including shoulder pain due to arm positioning and possible rib fracture with the pleural-based nodule's proximity to the ribs. We discussed damage to other critical  normal structures including heart, ribs, lung collapse, chronic cough, and brachial plexus injury. We discussed that without treatment this could develop into a more aggressive cancer.   Plan: The patient has decided to undergo SBRT to the left lower lung nodule. The patient signed a consent form and a copy was  placed in her medical chart. CT simulation is scheduled on 10/22/16 at 10AM. ____________________________________ -----------------------------------  Blair Promise, PhD, MD  This document serves as a record of services personally performed by Gery Pray, MD. It was created on his behalf by Darcus Austin, a trained medical scribe. The creation of this record is based on the scribe's personal observations and the provider's statements to them. This document has been checked and approved by the attending provider.

## 2016-10-18 ENCOUNTER — Ambulatory Visit (INDEPENDENT_AMBULATORY_CARE_PROVIDER_SITE_OTHER): Payer: Medicare Other | Admitting: Physician Assistant

## 2016-10-18 VITALS — BP 136/80 | HR 96 | Temp 98.4°F | Resp 18 | Ht 63.0 in | Wt 175.0 lb

## 2016-10-18 DIAGNOSIS — R05 Cough: Secondary | ICD-10-CM | POA: Diagnosis not present

## 2016-10-18 DIAGNOSIS — R062 Wheezing: Secondary | ICD-10-CM | POA: Diagnosis not present

## 2016-10-18 DIAGNOSIS — J22 Unspecified acute lower respiratory infection: Secondary | ICD-10-CM

## 2016-10-18 DIAGNOSIS — R058 Other specified cough: Secondary | ICD-10-CM

## 2016-10-18 LAB — POCT INFLUENZA A/B
Influenza A, POC: NEGATIVE
Influenza B, POC: NEGATIVE

## 2016-10-18 MED ORDER — PREDNISONE 20 MG PO TABS
20.0000 mg | ORAL_TABLET | Freq: Every day | ORAL | 0 refills | Status: DC
Start: 2016-10-18 — End: 2016-10-25

## 2016-10-18 MED ORDER — IPRATROPIUM BROMIDE 0.02 % IN SOLN: 0.5000 mg | Freq: Once | RESPIRATORY_TRACT | Status: DC

## 2016-10-18 MED ORDER — ALBUTEROL SULFATE (2.5 MG/3ML) 0.083% IN NEBU: 2.5000 mg | INHALATION_SOLUTION | Freq: Once | RESPIRATORY_TRACT | Status: DC

## 2016-10-18 MED ORDER — DOXYCYCLINE HYCLATE 100 MG PO CAPS: 100.0000 mg | ORAL_CAPSULE | Freq: Two times a day (BID) | ORAL | 0 refills | Status: AC

## 2016-10-18 NOTE — Patient Instructions (Addendum)
  Take medication as prescribed.  I will contact you if the flu swab is positive. Please let me know if you are not having any improvement.  IF you received an x-ray today, you will receive an invoice from [facility]. Please contact [facility] at [phone] with questions or concerns regarding your invoice.   IF you received labwork today, you will receive an invoice from [employer]. Please contact [facility] at [phone] with questions or concerns regarding your invoice.   Our billing staff will not be able to assist you with questions regarding bills from these companies.  You will be contacted with the lab results as soon as they are available. The fastest way to get your results is to activate your My Chart account. Instructions are located on the last page of this paperwork. If you have not heard from [place] regarding the results in 2 weeks, please contact this office.

## 2016-10-18 NOTE — Progress Notes (Signed)
[facility] [address], [place] [address] [phone]  Date:  10/18/2016   Name:  Tammy Ball   DOB:  11/11/1941   MRN:  [id number]  PCP:  Reginia Forts, MD    History of Present Illness:  Tammy Ball is a 75 y.o. female patient who presents to [facility] for 4 days of coughing.   Patient has cc of cough, headache, and wheezing.     Patient reports coughing without fever.  Cough is produce of clear sputum.  She has some nasal congestion and minimal rhinorrhea.  No dizziness.  HA secondary to the cough.  No ear pain and minimal throat pain.  No sneezing or watery eyes.   She has a recent hx of left lower lobe pulmonary nodule that is malignant.  Following up next week with oncology.  She has no significant sob or dyspnea.    Patient Active Problem List   Diagnosis Date Noted  . Dehydration 06/25/2016  . Hypercalcemia 06/25/2016  . Insomnia 05/16/2016  . Pneumonitis, radiation ([place]) 04/11/2016  . S/P lobectomy of lung 01/18/2016  . Chemotherapy-induced neuropathy ([place]) 11/01/2015  . Non-small cell carcinoma of right lung, stage 2 ([place]) 10/03/2015  . Morbid obesity ([place]) 10/01/2015  . Hyperlipidemia 08/14/2013  . Spondylolisthesis of lumbar region 07/03/2013  . Glaucoma 10/23/2012  . Essential hypertension 12/17/2011  . COPD GOLD II  12/17/2011  . Hearing loss 12/17/2011  . Renal cell cancer ([place]) 12/17/2011  . Sciatica of left side 12/17/2011  . Depression 12/17/2011    Past Medical History:  Diagnosis Date  . Anemia    was while doing chemo  . Anxiety   . Asthma   . Chemotherapy-induced neuropathy ([place]) 11/01/2015  . Claustrophobia   . COPD (chronic obstructive pulmonary disease) ([place])   . Depression   . Encounter for antineoplastic chemotherapy 02/09/2016  . Glaucoma   . Headache    prior to menopause  . Heart murmur   . History of echocardiogram    Echo 2/17: EF 93-81%, grade 1 diastolic dysfunction, mild MR, trivial pericardial effusion   . History of nuclear stress test    Myoview 2/17: no ischemia or scar, EF 79%; low risk  . Hyperlipidemia   . Hypertension   . Insomnia 05/16/2016  . Non-small cell carcinoma of right lung, stage 2 ([place]) 10/03/2015  . Radiation 02/29/16-04/10/16   50.4 Gy to right central chest  . Renal cell carcinoma ([place])    L nephrectomy  in 2012    Past Surgical History:  Procedure Laterality Date  . BACK SURGERY     cervical 1991  . EYE SURGERY    . kidney cancer    . NEPHRECTOMY    . SPINE SURGERY    . TUBAL LIGATION    . VIDEO ASSISTED THORACOSCOPY (VATS)/WEDGE RESECTION Right 01/18/2016   Procedure: VIDEO ASSISTED THORACOSCOPY (VATS)/LUNG RESECTION, THOROCOTOMY, RIGHT UPPER LOBECTOMY, LYMPH NODE DISSECTION, PLACEMENT OF ON Q;  Surgeon: [name], MD;  Location: [facility];  Service: Thoracic;  Laterality: Right;  Marland Kitchen VIDEO BRONCHOSCOPY Bilateral 09/20/2015   Procedure: VIDEO BRONCHOSCOPY WITHOUT FLUORO;  Surgeon: Rigoberto Noel, MD;  Location: [facility];  Service: Cardiopulmonary;  Laterality: Bilateral;  . VIDEO BRONCHOSCOPY N/A 01/18/2016   Procedure: VIDEO BRONCHOSCOPY;  Surgeon: [name], MD;  Location: [facility] OR;  Service: Thoracic;  Laterality: N/A;    Social History  Substance Use Topics  . Smoking status: Former Smoker  Packs/day: 0.50    Years: 50.00    Types: Cigarettes    Quit date: 03/16/2011  . Smokeless tobacco: Never Used  . Alcohol use No    Family History  Problem Relation Age of Onset  . Heart disease Sister   . Obesity Brother   . Heart attack Daughter 23    s/p CABG  . Glaucoma Daughter   . Breast cancer Sister   . Birth defects Sister   . Cancer Mother     Bladder Cancer  . Hypertension Mother   . CAD Mother 75  . Cancer Maternal Grandmother     Allergies  Allergen Reactions  . Bee Venom Anaphylaxis, Shortness Of Breath, Swelling and Other (See Comments)    Swelling at site   . Amlodipine Swelling and Other (See Comments)    Swelling of the  ankles and hands   . Levofloxacin Other (See Comments)    Joint pain   . Hctz [Hydrochlorothiazide] Palpitations and Other (See Comments)    Sweating     Medication list has been reviewed and updated.  Current Outpatient Prescriptions on File Prior to Visit  Medication Sig Dispense Refill  . acetaminophen (TYLENOL) 500 MG tablet Take 500 mg by mouth every 6 (six) hours as needed for mild pain, moderate pain, fever or headache. Reported on 05/24/2016    . albuterol (PROVENTIL) (2.5 MG/3ML) 0.083% nebulizer solution Take 2.5 mg by nebulization every 6 (six) hours as needed for wheezing or shortness of breath.    Marland Kitchen aspirin EC 81 MG tablet Take 81 mg by mouth daily. Reported on 04/11/2016    . atorvastatin (LIPITOR) 40 MG tablet TAKE ONE TABLET BY MOUTH ONCE DAILY. 90 tablet 3  . Cholecalciferol (VITAMIN D3 PO) Take 1 tablet by mouth daily.    . fish oil-omega-3 fatty acids 1000 MG capsule Take 1 capsule by mouth daily.     . fluticasone (FLONASE) 50 MCG/ACT nasal spray Place 2 sprays into both nostrils daily as needed for allergies.     Marland Kitchen LORazepam (ATIVAN) 0.5 MG tablet Take 1 tablet (0.5 mg total) by mouth 2 (two) times daily as needed for anxiety. 10 tablet 1  . metoprolol succinate (TOPROL-XL) 50 MG 24 hr tablet Take 1 tablet (50 mg total) by mouth daily. Take with or immediately following a meal. 30 tablet 5  . polyvinyl alcohol-povidone (REFRESH) 1.4-0.6 % ophthalmic solution Place 2 drops into both eyes daily as needed (for dry eyes). Reported on 05/24/2016    . temazepam (RESTORIL) 15 MG capsule Take 1 capsule (15 mg total) by mouth at bedtime as needed for sleep. 30 capsule 0  . traMADol (ULTRAM) 50 MG tablet Take 50 mg by mouth every 6 (six) hours as needed for moderate pain. Reported on 05/24/2016    . albuterol (PROVENTIL HFA;VENTOLIN HFA) 108 (90 BASE) MCG/ACT inhaler Inhale 2 puffs into the lungs every 4 (four) hours as needed for wheezing or shortness of breath. 1 Inhaler 0   No  current facility-administered medications on file prior to visit.     ROS ROS otherwise unremarkable unless listed above.   Physical Examination: BP 136/80 (BP Location: Right Arm, Patient Position: Sitting, Cuff Size: Large)   Pulse 96   Temp 98.4 F (36.9 C) (Oral)   Resp 18   Ht '5\' 3"'$  (1.6 m)   Wt 175 lb (79.4 kg)   SpO2 94%   BMI 31.00 kg/m  Ideal Body Weight: Weight in (lb) to have BMI =  25: 140.8  Physical Exam  Constitutional: She is oriented to person, place, and time. She appears well-developed and well-nourished. No distress.  HENT:  Head: Normocephalic and atraumatic.  Right Ear: Tympanic membrane, external ear and ear canal normal.  Left Ear: Tympanic membrane, external ear and ear canal normal.  Nose: Mucosal edema and rhinorrhea present. Right sinus exhibits no maxillary sinus tenderness and no frontal sinus tenderness. Left sinus exhibits no maxillary sinus tenderness and no frontal sinus tenderness.  Mouth/Throat: No uvula swelling. No oropharyngeal exudate, posterior oropharyngeal edema or posterior oropharyngeal erythema.  Eyes: Conjunctivae and EOM are normal. Pupils are equal, round, and reactive to light.  Cardiovascular: Normal rate and regular rhythm.  Exam reveals no gallop, no distant heart sounds and no friction rub.   No murmur heard. Pulmonary/Chest: Effort normal. No respiratory distress. She has no decreased breath sounds. She has no wheezes. She has no rhonchi.  Lymphadenopathy:       Head (right side): No submandibular, no tonsillar, no preauricular and no posterior auricular adenopathy present.       Head (left side): No submandibular, no tonsillar, no preauricular and no posterior auricular adenopathy present.  Neurological: She is alert and oriented to person, place, and time.  Skin: She is not diaphoretic.  Psychiatric: She has a normal mood and affect. Her behavior is normal.     Assessment and Plan: Tammy Ball is a 75 y.o. female who  is here today for cough, headache, and sinus issues. Will proceed with prednisone taper and antibiotic.  She will follow up with [place] or her oncologist.  Lower respiratory infection (e.g., bronchitis, pneumonia, pneumonitis, pulmonitis)  Wheezing - Plan: POCT Influenza A/B, predniSONE (DELTASONE) 20 MG tablet, doxycycline (VIBRAMYCIN) 100 MG capsule, DISCONTINUED: albuterol (PROVENTIL) (2.5 MG/3ML) 0.083% nebulizer solution 2.5 mg, DISCONTINUED: ipratropium (ATROVENT) nebulizer solution 0.5 mg  Productive cough - Plan: POCT Influenza A/B, doxycycline (VIBRAMYCIN) 100 MG capsule  Ivar Drape, PA-C [facility] 10/18/2016 10:53 AM

## 2016-10-22 ENCOUNTER — Ambulatory Visit
Admission: RE | Admit: 2016-10-22 | Discharge: 2016-10-22 | Disposition: A | Discharge status: Home / Self Care | Payer: Medicare Other | Source: Ambulatory Visit | Attending: Radiation Oncology | Admitting: Radiation Oncology

## 2016-10-22 DIAGNOSIS — C3491 Malignant neoplasm of unspecified part of right bronchus or lung: Secondary | ICD-10-CM

## 2016-10-22 DIAGNOSIS — M546 Pain in thoracic spine: Secondary | ICD-10-CM | POA: Diagnosis not present

## 2016-10-22 DIAGNOSIS — Z923 Personal history of irradiation: Secondary | ICD-10-CM | POA: Diagnosis not present

## 2016-10-22 DIAGNOSIS — C7802 Secondary malignant neoplasm of left lung: Secondary | ICD-10-CM | POA: Diagnosis not present

## 2016-10-22 DIAGNOSIS — C3432 Malignant neoplasm of lower lobe, left bronchus or lung: Secondary | ICD-10-CM | POA: Diagnosis not present

## 2016-10-22 DIAGNOSIS — G8929 Other chronic pain: Secondary | ICD-10-CM | POA: Diagnosis not present

## 2016-10-22 DIAGNOSIS — Z510 Encounter for antineoplastic radiation therapy: Secondary | ICD-10-CM | POA: Diagnosis not present

## 2016-10-22 DIAGNOSIS — C3431 Malignant neoplasm of lower lobe, right bronchus or lung: Secondary | ICD-10-CM | POA: Diagnosis not present

## 2016-10-22 NOTE — Addendum Note (Signed)
Encounter addended by: Jacqulyn Liner, RN on: 10/22/2016  8:11 AM<BR>    Actions taken: Charge Capture section accepted

## 2016-10-22 NOTE — Progress Notes (Signed)
  Radiation Oncology         [phone] ________________________________  Name: Tammy Ball MRN: [id number]  Date: 10/22/2016  DOB: [birth year]   STEREOTACTIC BODY RADIOTHERAPY SIMULATION AND TREATMENT PLANNING NOTE    DIAGNOSIS:  Stage IIIA (ypT2a, pN2, M0) non-small cell carcinoma (squamous cell carcinoma) of the right lower lung, now with a solitary left lower lobe lung nodule ([place] metastasis)  NARRATIVE:  The patient was brought to the [facility].  Identity was confirmed.  All relevant records and images related to the planned course of therapy were reviewed.  The patient freely provided informed written consent to proceed with treatment after reviewing the details related to the planned course of therapy. The consent form was witnessed and verified by the simulation staff.  Then, the patient was set-up in a stable reproducible  supine position for radiation therapy.  A BodyFix immobilization pillow was fabricated for reproducible positioning.  Then I personally applied the abdominal compression paddle to limit respiratory excursion.  4D respiratoy motion management CT images were obtained.  Surface markings were placed.  The CT images were loaded into the planning software.  Then, using Cine, MIP, and standard views, the internal target volume (ITV) and planning target volumes (PTV) were delinieated, and avoidance structures were contoured.  Treatment planning then occurred.  The radiation prescription was entered and confirmed.  A total of two complex treatment devices were fabricated in the form of the BodyFix immobilization pillow and a neck accuform cushion.  I have requested : 3D Simulation  I have requested a DVH of the following structures: Heart, Lungs, Esophagus, Chest Wall, Brachial Plexus, Major Blood Vessels, and targets.  PLAN:  The patient will receive 54 Gy in 3 fractions.  -----------------------------------  Blair Promise, PhD, MD  This document  serves as a record of services personally performed by Gery Pray, MD. It was created on his behalf by Bethann Humble, a trained medical scribe. The creation of this record is based on the scribe's personal observations and the provider's statements to them. This document has been checked and approved by the attending provider.

## 2016-10-25 ENCOUNTER — Encounter (HOSPITAL_COMMUNITY): Payer: Self-pay

## 2016-10-25 ENCOUNTER — Encounter: Payer: Self-pay | Admitting: Urgent Care

## 2016-10-25 ENCOUNTER — Ambulatory Visit (INDEPENDENT_AMBULATORY_CARE_PROVIDER_SITE_OTHER): Payer: Medicare Other | Admitting: Urgent Care

## 2016-10-25 ENCOUNTER — Ambulatory Visit (INDEPENDENT_AMBULATORY_CARE_PROVIDER_SITE_OTHER): Payer: Medicare Other

## 2016-10-25 ENCOUNTER — Ambulatory Visit (HOSPITAL_COMMUNITY)
Admission: RE | Admit: 2016-10-25 | Discharge: 2016-10-25 | Disposition: A | Discharge status: Home / Self Care | Payer: Medicare Other | Source: Ambulatory Visit | Attending: Urgent Care | Admitting: Urgent Care

## 2016-10-25 VITALS — BP 142/80 | HR 87 | Temp 98.8°F | Resp 18 | Ht 63.0 in | Wt 175.6 lb

## 2016-10-25 DIAGNOSIS — R05 Cough: Secondary | ICD-10-CM

## 2016-10-25 DIAGNOSIS — R0789 Other chest pain: Secondary | ICD-10-CM

## 2016-10-25 DIAGNOSIS — J449 Chronic obstructive pulmonary disease, unspecified: Secondary | ICD-10-CM

## 2016-10-25 DIAGNOSIS — R059 Cough, unspecified: Secondary | ICD-10-CM

## 2016-10-25 DIAGNOSIS — J22 Unspecified acute lower respiratory infection: Secondary | ICD-10-CM

## 2016-10-25 DIAGNOSIS — R0602 Shortness of breath: Secondary | ICD-10-CM | POA: Diagnosis not present

## 2016-10-25 DIAGNOSIS — R062 Wheezing: Secondary | ICD-10-CM

## 2016-10-25 DIAGNOSIS — R918 Other nonspecific abnormal finding of lung field: Secondary | ICD-10-CM | POA: Diagnosis not present

## 2016-10-25 DIAGNOSIS — R079 Chest pain, unspecified: Secondary | ICD-10-CM | POA: Diagnosis not present

## 2016-10-25 DIAGNOSIS — C3491 Malignant neoplasm of unspecified part of right bronchus or lung: Secondary | ICD-10-CM

## 2016-10-25 LAB — POCT CBC
Granulocyte percent: 74.2 %G (ref 37–80)
HCT, POC: 36.1 % — AB (ref 37.7–47.9)
Hemoglobin: 12.3 g/dL (ref 12.2–16.2)
Lymph, poc: 1.3 (ref 0.6–3.4)
MCH, POC: 30.1 pg (ref 27–31.2)
MCHC: 34 g/dL (ref 31.8–35.4)
MCV: 88.7 fL (ref 80–97)
MID (cbc): 0.5 (ref 0–0.9)
MPV: 7 fL (ref 0–99.8)
POC Granulocyte: 5.1 (ref 2–6.9)
POC LYMPH PERCENT: 18.5 %L (ref 10–50)
POC MID %: 7.3 %M (ref 0–12)
Platelet Count, POC: 274 10*3/uL (ref 142–424)
RBC: 4.07 M/uL (ref 4.04–5.48)
RDW, POC: 17.4 %
WBC: 6.9 10*3/uL (ref 4.6–10.2)

## 2016-10-25 MED ORDER — AMOXICILLIN-POT CLAVULANATE 875-125 MG PO TABS: 1.0000 | ORAL_TABLET | Freq: Two times a day (BID) | ORAL | 0 refills | Status: DC

## 2016-10-25 MED ORDER — PREDNISONE 20 MG PO TABS: ORAL_TABLET | ORAL | 0 refills | Status: DC

## 2016-10-25 MED ORDER — ALBUTEROL SULFATE (2.5 MG/3ML) 0.083% IN NEBU
2.5000 mg | INHALATION_SOLUTION | Freq: Once | RESPIRATORY_TRACT | Status: AC
  Administered 2016-10-25: 2.5 mg via RESPIRATORY_TRACT

## 2016-10-25 MED ORDER — LEVALBUTEROL HCL 0.63 MG/3ML IN NEBU: 0.6300 mg | INHALATION_SOLUTION | Freq: Once | RESPIRATORY_TRACT | Status: DC

## 2016-10-25 MED ORDER — MOMETASONE FURO-FORMOTEROL FUM 100-5 MCG/ACT IN AERO: 2.0000 | INHALATION_SPRAY | Freq: Two times a day (BID) | RESPIRATORY_TRACT | 5 refills | Status: DC

## 2016-10-25 MED ORDER — IOPAMIDOL (ISOVUE-370) INJECTION 76%
100.0000 mL | Freq: Once | INTRAVENOUS | Status: AC | PRN
  Administered 2016-10-25: 60 mL via INTRAVENOUS

## 2016-10-25 MED ORDER — LEVALBUTEROL HCL 1.25 MG/0.5ML IN NEBU
0.6300 mg | INHALATION_SOLUTION | Freq: Once | RESPIRATORY_TRACT | Status: DC
  Administered 2016-10-25: 0.63 mg via RESPIRATORY_TRACT

## 2016-10-25 MED ORDER — IPRATROPIUM BROMIDE 0.02 % IN SOLN
0.5000 mg | Freq: Once | RESPIRATORY_TRACT | Status: AC
  Administered 2016-10-25: 0.5 mg via RESPIRATORY_TRACT

## 2016-10-25 NOTE — Patient Instructions (Addendum)
Please schedule your nebulizer treatments 3 times daily. Use Dulera twice daily. The prednisone course should take 10 days to complete. Make sure you take Augmentin with food and/or probiotic.    IF you received an x-ray today, you will receive an invoice from [facility]. Please contact [facility] at [phone] with questions or concerns regarding your invoice.   IF you received labwork today, you will receive an invoice from [employer]. Please contact [facility] at [phone] with questions or concerns regarding your invoice.   Our billing staff will not be able to assist you with questions regarding bills from these companies.  You will be contacted with the lab results as soon as they are available. The fastest way to get your results is to activate your My Chart account. Instructions are located on the last page of this paperwork. If you have not heard from [place] regarding the results in 2 weeks, please contact this office.

## 2016-10-25 NOTE — Progress Notes (Signed)
MRN: [id number] DOB: [birth year]  Subjective:   Tammy Ball is a 75 y.o. female with pmh Stage 2 Tammy Ball carcinoma of right lung presenting for chief complaint of Follow-up; URI (not doing any better); Cough (coughing non stop; chest and throat hurts; coughing up green sputum); and Nausea  Reports 1.5 week history of persistent productive cough. Last OV visit was 10/18/2016, was treated for lower respiratory infection with doxycycline, prednisone. She had some improvement on this regimen. However, 3 days ago, patient started having fever (highest was 100.57F), shob, back soreness, nausea, dyspnea relieved with rest. She continues to produce green sputum but no hemoptysis. She is about to start radiation therapy on 10/30/2016. She was told by her oncologist to be seen by [place]. However, at her last OV with her oncologist, Dr. Curt Bears, she was given oxygen which improved her oxygen saturation. Today, her oxygen saturation is at 91% at triage. In the exam room, patient denies active shob. She has been using her albuterol inhaler intermittently, nebulizer twice daily. Denies smoking cigarettes. Admits history of COPD, not on home oxygen.  Tammy Ball has a current medication list which includes the following prescription(s): acetaminophen, albuterol, aspirin ec, atorvastatin, cholecalciferol, doxycycline, fish oil-omega-3 fatty acids, fluticasone, lorazepam, metoprolol succinate, polyvinyl alcohol-povidone, prednisone, temazepam, tramadol, and albuterol. Also is allergic to bee venom; amlodipine; levofloxacin; and hctz [hydrochlorothiazide].  Tammy Ball  has a past medical history of Anemia; Anxiety; Asthma; Chemotherapy-induced neuropathy ([place]) (11/01/2015); Claustrophobia; COPD (chronic obstructive pulmonary disease) ([place]); Depression; Encounter for antineoplastic chemotherapy (02/09/2016); Glaucoma; Headache; Heart murmur; History of echocardiogram; History of nuclear stress test; Hyperlipidemia;  Hypertension; Insomnia (05/16/2016); Non-small cell carcinoma of right lung, stage 2 ([place]) (10/03/2015); Radiation (02/29/16-04/10/16); and Renal cell carcinoma ([place]). Also  has a past surgical history that includes Nephrectomy; kidney cancer; Tubal ligation; Spine surgery; Eye surgery; Video bronchoscopy (Bilateral, 09/20/2015); Back surgery; Video bronchoscopy (N/A, 01/18/2016); and Video assisted thoracoscopy (vats)/wedge resection (Right, 01/18/2016).  Objective:   Vitals: BP (!) 142/80   Pulse 87   Temp 98.8 F (37.1 C) (Oral)   Resp 18   Ht '5\' 3"'$  (1.6 m)   Wt 175 lb 9.6 oz (79.7 kg)   SpO2 91%   PF 200 L/min Comment: pre -160 (predicted 400)  and post - 200 (predicted 400)  BMI 31.11 kg/m   Oxygen saturation is at 94% on recheck at 10:25. Pulse is 73.  Physical Exam  Constitutional: She is oriented to person, place, and time. She appears well-developed and well-nourished.  Cardiovascular: Normal rate, regular rhythm and intact distal pulses.  Exam reveals no gallop and no friction rub.   No murmur heard. Pulmonary/Chest: No respiratory distress. She has wheezes (diffuse with coarse lung sounds throughout). She has no rales.  Neurological: She is alert and oriented to person, place, and time.  Skin: Skin is warm and dry.   Dg Chest 2 View  Result Date: 10/25/2016 CLINICAL DATA:  Cough, wheezing, shortness of breath, hypoxemia, atypical chest pain, history COPD, hypertension, former smoker ; former RIGHT upper lobectomy for non-small cell carcinoma of the RIGHT lung stage II, prior renal cell carcinoma post LEFT nephrectomy EXAM: CHEST  2 VIEW COMPARISON:  07/30/2016 FINDINGS: Upper normal heart size. Atherosclerotic calcification aorta. Mediastinal contours and pulmonary vascularity normal. Post RIGHT upper lobectomy changes again identified. Emphysematous and chronic bronchitic changes noted. No acute infiltrate, pleural effusion or pneumothorax. Bones demineralized with evidence of prior  cervical spine fusion. IMPRESSION: Postsurgical changes in the RIGHT hemithorax from prior  RIGHT upper lobectomy. COPD changes without acute infiltrate. Aortic atherosclerosis. Electronically Signed   By: Lavonia Dana M.D.   On: 10/25/2016 11:37   Results for orders placed or performed in visit on 10/25/16 (from the past 24 hour(s))  POCT CBC     Status: Abnormal   Collection Time: 10/25/16 11:10 AM  Result Value Ref Range   WBC 6.9 4.6 - 10.2 K/uL   Lymph, poc 1.3 0.6 - 3.4   POC LYMPH PERCENT 18.5 10 - 50 %L   MID (cbc) 0.5 0 - 0.9   POC MID % 7.3 0 - 12 %M   POC Granulocyte 5.1 2 - 6.9   Granulocyte percent 74.2 37 - 80 %G   RBC 4.07 4.04 - 5.48 M/uL   Hemoglobin 12.3 12.2 - 16.2 g/dL   HCT, POC 36.1 (A) 37.7 - 47.9 %   MCV 88.7 80 - 97 fL   MCH, POC 30.1 27 - 31.2 pg   MCHC 34.0 31.8 - 35.4 g/dL   RDW, POC 17.4 %   Platelet Count, POC 274 142 - 424 K/uL   MPV 7.0 0 - 99.8 fL   Assessment and Plan :   This case was precepted with Dr. Carlota Raspberry and Dr. Tamala Julian.   1. Cough 2. Shortness of breath 3. Wheezing 4. Atypical chest pain 5. Lower respiratory infection (e.g., bronchitis, pneumonia, pneumonitis, pulmonitis) 6. Non-small cell carcinoma of right lung, stage 2 ([place]) - Patient had mild improvement s/p her first nebulizer treatment. Will pursue chest CT stat at [facility] per patient's request. We did not check her second peak flow s/p 2nd nebulizer treatment due to her having to leave for her chest CT. Differential includes PE, respiratory infection versus COPD, reactive airway disease. I will have patient start an additional steroid taper lasting 10 days. She will also start Dulera twice daily, schedule nebulizer treatments. Lastly, patient will stop doxycycline and use Augmentin.   [name], PA-C [facility] [phone] 10/25/2016 10:16 AM

## 2016-10-26 ENCOUNTER — Telehealth: Payer: Self-pay | Admitting: *Deleted

## 2016-10-26 ENCOUNTER — Telehealth: Payer: Self-pay

## 2016-10-26 NOTE — Telephone Encounter (Signed)
Patient recently had CT done by PCP, which showed new nodule in lung. Patient called concerned and would like to know if an appointment can be scheduled to discuss this further.

## 2016-10-26 NOTE — Telephone Encounter (Signed)
PA was completed on covermymeds for [facility]. Mani, I haven't gotten a denial yet, but ins will not usually approve unless pt has failed preferred med in same class. Advair is probably the preferred med, do you want to try that so that the patient can get started on something?

## 2016-10-26 NOTE — Telephone Encounter (Signed)
She is seeing Dr. Sondra Come for SBRT. She can discuss it with him. I will be happy to see her if needed after she sees [facility]ard.

## 2016-10-27 ENCOUNTER — Encounter: Payer: Self-pay | Admitting: Internal Medicine

## 2016-10-27 NOTE — Telephone Encounter (Signed)
Yes and thank for your help with this!

## 2016-10-30 ENCOUNTER — Encounter: Payer: Self-pay | Admitting: Radiation Oncology

## 2016-10-30 ENCOUNTER — Ambulatory Visit
Admission: RE | Admit: 2016-10-30 | Discharge: 2016-10-30 | Disposition: A | Discharge status: Home / Self Care | Payer: Medicare Other | Source: Ambulatory Visit | Attending: Radiation Oncology | Admitting: Radiation Oncology

## 2016-10-30 ENCOUNTER — Ambulatory Visit (INDEPENDENT_AMBULATORY_CARE_PROVIDER_SITE_OTHER): Payer: Medicare Other | Admitting: Family Medicine

## 2016-10-30 ENCOUNTER — Encounter: Payer: Self-pay | Admitting: Internal Medicine

## 2016-10-30 ENCOUNTER — Encounter: Payer: Self-pay | Admitting: Family Medicine

## 2016-10-30 VITALS — BP 170/90 | HR 85 | Temp 99.1°F | Resp 16 | Ht 63.0 in | Wt 173.0 lb

## 2016-10-30 VITALS — BP 156/95 | HR 101 | Temp 99.0°F | Resp 20 | Wt 173.4 lb

## 2016-10-30 DIAGNOSIS — M4316 Spondylolisthesis, lumbar region: Secondary | ICD-10-CM | POA: Diagnosis not present

## 2016-10-30 DIAGNOSIS — C3432 Malignant neoplasm of lower lobe, left bronchus or lung: Secondary | ICD-10-CM | POA: Diagnosis not present

## 2016-10-30 DIAGNOSIS — J449 Chronic obstructive pulmonary disease, unspecified: Secondary | ICD-10-CM

## 2016-10-30 DIAGNOSIS — J441 Chronic obstructive pulmonary disease with (acute) exacerbation: Secondary | ICD-10-CM

## 2016-10-30 DIAGNOSIS — M5432 Sciatica, left side: Secondary | ICD-10-CM

## 2016-10-30 DIAGNOSIS — Z923 Personal history of irradiation: Secondary | ICD-10-CM | POA: Diagnosis not present

## 2016-10-30 DIAGNOSIS — M546 Pain in thoracic spine: Secondary | ICD-10-CM | POA: Diagnosis not present

## 2016-10-30 DIAGNOSIS — C7802 Secondary malignant neoplasm of left lung: Secondary | ICD-10-CM | POA: Diagnosis not present

## 2016-10-30 DIAGNOSIS — Z510 Encounter for antineoplastic radiation therapy: Secondary | ICD-10-CM | POA: Diagnosis not present

## 2016-10-30 DIAGNOSIS — C3491 Malignant neoplasm of unspecified part of right bronchus or lung: Secondary | ICD-10-CM | POA: Diagnosis not present

## 2016-10-30 DIAGNOSIS — J181 Lobar pneumonia, unspecified organism: Secondary | ICD-10-CM | POA: Diagnosis not present

## 2016-10-30 DIAGNOSIS — G8929 Other chronic pain: Secondary | ICD-10-CM | POA: Diagnosis not present

## 2016-10-30 DIAGNOSIS — I10 Essential (primary) hypertension: Secondary | ICD-10-CM

## 2016-10-30 DIAGNOSIS — G44209 Tension-type headache, unspecified, not intractable: Secondary | ICD-10-CM

## 2016-10-30 DIAGNOSIS — C3431 Malignant neoplasm of lower lobe, right bronchus or lung: Secondary | ICD-10-CM | POA: Diagnosis not present

## 2016-10-30 DIAGNOSIS — J189 Pneumonia, unspecified organism: Secondary | ICD-10-CM

## 2016-10-30 MED ORDER — TRAMADOL HCL 50 MG PO TABS: 50.0000 mg | ORAL_TABLET | Freq: Four times a day (QID) | ORAL | 0 refills | Status: DC | PRN

## 2016-10-30 NOTE — Progress Notes (Signed)
Subjective:    Patient ID: Tammy Ball, female    DOB: 30-Mar-1941, 75 y.o.   MRN: [id number]  10/30/2016  Follow-up (pnumonia and sciatica) and Medication Refill (tramadol)   HPI This 75 y.o. female presents for one month follow-up of hypertension, COPD exacerbation, pneumonia.  Keeping a log of blood pressures at home. BP ranges 99/56-115/70-130/74, 160/79.  HR is ranging 60-80s.  Tolerating Metoprolol without side effects; heart rate has responded nicely.  COPD exacerbation: increased nebulizer tid; inhaler bid; taking steroids, abx; finished Doxy; still on Prednisone and Augmentin. No fever in four days. +chills.  Low grade fever this morning.  +HA mild.  No ear pain or sore thorat.  +rhinorrhea; +some coughing; +some wheezing.  Pulse oximetry this morning at home 98%; last week 88% at radiation visit.  Unable to stop wheezing.   Feeling much better.  Lung cancer: went for radiation appointment last week but had to give oxygen due to pulse ox in 80s.  Second nodule present on repeat CT scan last week.  Sent email to [facility].  Received call yesterday regarding additional nodule.    Severe  HA: hair is hurting; history of migraines yet none since menopause; no nausea or vomiting; no photophobia; no phonophobia.  No dizziness; vision is blurred.  Severity 9/10; bad headache.   Onset in office today; history of migraines yet quite rare.  Has not taken anything for headache.  Denies blurred vision, paresthesias, confusion, focal weakness.  +photophobia.    Sciatica: improved with steroids. No regular tramadol currently; would like refill.    Review of Systems  Constitutional: Negative for chills, diaphoresis, fatigue and fever.  Eyes: Negative for visual disturbance.  Respiratory: Positive for cough, shortness of breath and wheezing.   Cardiovascular: Negative for chest pain, palpitations and leg swelling.  Gastrointestinal: Negative for abdominal pain, constipation, diarrhea, nausea and  vomiting.  Endocrine: Negative for cold intolerance, heat intolerance, polydipsia, polyphagia and polyuria.  Neurological: Positive for headaches. Negative for dizziness, tremors, seizures, syncope, facial asymmetry, speech difficulty, weakness, light-headedness and numbness.    Past Medical History:  Diagnosis Date  . Anemia    was while doing chemo  . Anxiety   . Asthma   . Chemotherapy-induced neuropathy ([place]) 11/01/2015  . Claustrophobia   . COPD (chronic obstructive pulmonary disease) ([place])   . Depression   . Encounter for antineoplastic chemotherapy 02/09/2016  . Glaucoma   . Headache    prior to menopause  . Heart murmur   . History of echocardiogram    Echo 2/17: EF 86-76%, grade 1 diastolic dysfunction, mild MR, trivial pericardial effusion  . History of nuclear stress test    Myoview 2/17: no ischemia or scar, EF 79%; low risk  . Hyperlipidemia   . Hypertension   . Insomnia 05/16/2016  . Non-small cell carcinoma of right lung, stage 2 ([place]) 10/03/2015  . Radiation 02/29/16-04/10/16   50.4 Gy to right central chest  . Renal cell carcinoma ([place])    L nephrectomy  in 2012   Past Surgical History:  Procedure Laterality Date  . BACK SURGERY     cervical 1991  . EYE SURGERY    . kidney cancer    . NEPHRECTOMY    . SPINE SURGERY    . TUBAL LIGATION    . VIDEO ASSISTED THORACOSCOPY (VATS)/WEDGE RESECTION Right 01/18/2016   Procedure: VIDEO ASSISTED THORACOSCOPY (VATS)/LUNG RESECTION, THOROCOTOMY, RIGHT UPPER LOBECTOMY, LYMPH NODE DISSECTION, PLACEMENT OF ON Q;  Surgeon: Lilia Argue  Servando Snare, MD;  Location: [facility];  Service: Thoracic;  Laterality: Right;  Marland Kitchen VIDEO BRONCHOSCOPY Bilateral 09/20/2015   Procedure: VIDEO BRONCHOSCOPY WITHOUT FLUORO;  Surgeon: Rigoberto Noel, MD;  Location: [facility];  Service: Cardiopulmonary;  Laterality: Bilateral;  . VIDEO BRONCHOSCOPY N/A 01/18/2016   Procedure: VIDEO BRONCHOSCOPY;  Surgeon: [name], MD;  Location: [facility] OR;  Service:  Thoracic;  Laterality: N/A;   Allergies  Allergen Reactions  . Bee Venom Anaphylaxis, Shortness Of Breath, Swelling and Other (See Comments)    Swelling at site   . Amlodipine Swelling and Other (See Comments)    Swelling of the ankles and hands   . Levofloxacin Other (See Comments)    Joint pain   . Hctz [Hydrochlorothiazide] Palpitations and Other (See Comments)    Sweating     Social History   Social History  . Marital status: Widowed    Spouse name: N/A  . Number of children: N/A  . Years of education: N/A   Occupational History  . Not on file.   Social History Main Topics  . Smoking status: Former Smoker    Packs/day: 0.50    Years: 50.00    Types: Cigarettes    Quit date: 03/16/2011  . Smokeless tobacco: Never Used  . Alcohol use No  . Drug use: No  . Sexual activity: Not Currently   Other Topics Concern  . Not on file   Social History Narrative   Marital status: divorced; not dating.      Children: 4 children; 3 grandchildren adult; 4 gg.      Lives: alone in house      Employment: full time substance abuse counselor; [employer].      Tobacco: quit smoking 2012; smoked 45 years      Alcohol: none      Drugs: none      ADLs: independent with ADLs; drives.       Advanced Directives: YES: HCPOA: Nicholas Martinez/son.  FULL CODE but no prolonged measures.      Occupation: Substance Abuse Estate agent   No exercise** Merged History Encounter **       ** Data from: 12/14/11 Enc Dept: [facility]       ** Data from: 12/17/11 Enc Dept: [facility]   Substance abuse counselor   Husband deceased   4 great grandchildren   Son works in same substance abuse counseling center as patient   Family History  Problem Relation Age of Onset  . Heart disease Sister   . Obesity Brother   . Heart attack Daughter 21    s/p CABG  . Glaucoma Daughter   . Breast cancer Sister   . Birth defects Sister   . Cancer Mother       Bladder Cancer  . Hypertension Mother   . CAD Mother 47  . Cancer Maternal Grandmother        Objective:    BP (!) 170/90 (BP Location: Left Arm, Patient Position: Sitting, Cuff Size: Normal)   Pulse 85   Temp 99.1 F (37.3 C) (Oral)   Resp 16   Ht '5\' 3"'$  (1.6 m)   Wt 173 lb (78.5 kg)   SpO2 96%   BMI 30.65 kg/m  Physical Exam  Constitutional: She is oriented to person, place, and time. She appears well-developed and well-nourished. No distress.  HENT:  Head: Normocephalic and atraumatic.  Right Ear: External ear  normal.  Left Ear: External ear normal.  Nose: Nose normal.  Mouth/Throat: Oropharynx is clear and moist.  Eyes: Conjunctivae and EOM are normal. Pupils are equal, round, and reactive to light.  Neck: Normal range of motion. Neck supple. Carotid bruit is not present. No thyromegaly present.  Cardiovascular: Normal rate, regular rhythm, normal heart sounds and intact distal pulses.  Exam reveals no gallop and no friction rub.   No murmur heard. Pulmonary/Chest: Effort normal and breath sounds normal. She has no wheezes. She has no rales.  Abdominal: Soft. Bowel sounds are normal. She exhibits no distension and no mass. There is no tenderness. There is no rebound and no guarding.  Lymphadenopathy:    She has no cervical adenopathy.  Neurological: She is alert and oriented to person, place, and time. No cranial nerve deficit. She exhibits normal muscle tone. Coordination normal.  Skin: Skin is warm and dry. No rash noted. She is not diaphoretic. No erythema. No pallor.  Psychiatric: She has a normal mood and affect. Her behavior is normal.   Results for orders placed or performed in visit on 10/30/16  CBC with Differential/Platelet  Result Value Ref Range   WBC 10.8 3.4 - 10.8 x10E3/uL   RBC 4.38 3.77 - 5.28 x10E6/uL   Hemoglobin 13.3 11.1 - 15.9 g/dL   Hematocrit 39.6 34.0 - 46.6 %   MCV 90 79 - 97 fL   MCH 30.4 26.6 - 33.0 pg   MCHC 33.6 31.5 - 35.7 g/dL    RDW 17.8 (H) 12.3 - 15.4 %   Platelets 406 (H) 150 - 379 x10E3/uL   Neutrophils 83 Not Estab. %   Lymphs 11 Not Estab. %   Monocytes 5 Not Estab. %   Eos 0 Not Estab. %   Basos 0 Not Estab. %   Neutrophils Absolute 9.0 (H) 1.4 - 7.0 x10E3/uL   Lymphocytes Absolute 1.1 0.7 - 3.1 x10E3/uL   Monocytes Absolute 0.5 0.1 - 0.9 x10E3/uL   EOS (ABSOLUTE) 0.0 0.0 - 0.4 x10E3/uL   Basophils Absolute 0.0 0.0 - 0.2 x10E3/uL   Immature Granulocytes 1 Not Estab. %   Immature Grans (Abs) 0.1 0.0 - 0.1 x10E3/uL  Comprehensive metabolic panel  Result Value Ref Range   Glucose 111 (H) 65 - 99 mg/dL   BUN 29 (H) 8 - 27 mg/dL   Creatinine, Ser 1.23 (H) 0.57 - 1.00 mg/dL   GFR calc non Af Amer 44 (L) >59 mL/min/1.73   GFR calc Af Amer 50 (L) >59 mL/min/1.73   BUN/Creatinine Ratio 24 12 - 28   Sodium 143 134 - 144 mmol/L   Potassium 4.8 3.5 - 5.2 mmol/L   Chloride 98 96 - 106 mmol/L   CO2 24 18 - 29 mmol/L   Calcium 10.0 8.7 - 10.3 mg/dL   Total Protein 7.8 6.0 - 8.5 g/dL   Albumin 4.5 3.5 - 4.8 g/dL   Globulin, Total 3.3 1.5 - 4.5 g/dL   Albumin/Globulin Ratio 1.4 1.2 - 2.2   Bilirubin Total 0.3 0.0 - 1.2 mg/dL   Alkaline Phosphatase 103 39 - 117 IU/L   AST 14 0 - 40 IU/L   ALT 14 0 - 32 IU/L       Assessment & Plan:   1. Community acquired pneumonia of right upper lobe of lung ([place])   2. COPD exacerbation ([place])   3. Essential hypertension, benign   4. Non-small cell carcinoma of right lung, stage 2 ([place])   5. COPD GOLD  II    6. Sciatica of left side   7. Spondylolisthesis of lumbar region   8. Acute non intractable tension-type headache    -blood pressure stable with addition of Metoprolol -COPD exacerbation with pneumonia improving; no respiratory distress in office.  Continue current treatment. -acute onset headache during visit; pt took two Tylenol during visit with improvement in headache; non-focal neuro exam in office. Obtain labs to rule out secondary causes. -L sciatica  improved with recent steroid taper for COPD exacerbation; refill of tramadol provided. -followed closely by Dr. Mohamed/oncology for lung cancer.   Orders Placed This Encounter  Procedures  . CBC with Differential/Platelet  . Comprehensive metabolic panel   Meds ordered this encounter  Medications  . traMADol (ULTRAM) 50 MG tablet    Sig: Take 1 tablet (50 mg total) by mouth every 6 (six) hours as needed for moderate pain. Reported on 05/24/2016    Dispense:  60 tablet    Refill:  0    No Follow-up on file.   Joncarlos Atkison Elayne Guerin, M.D. [facility] [address] [place], [place]  [address] [phone] phone [phone] fax

## 2016-10-30 NOTE — Progress Notes (Signed)
  Radiation Oncology         ([phone]) [phone] ________________________________  Name: Tammy Ball MRN: [id number]  Date: 10/30/2016  DOB: 02-21-1941    Weekly Radiation Therapy Management  DIAGNOSIS:  Stage IIIA(ypT2a, pN2, M0) non-small cell carcinoma (squamous cell carcinoma) of the right lower lung, now with a solitary left lower lobe lung nodule ([place] metastasis)   Current Dose: 18 Gy     Planned Dose:  54 Gy  Narrative . . . . . . . . The patient presents for routine under treatment assessment.                                    SBRT LLL lung 1 completed. On Augmentin for 5 more days for pneumonia. Saw Dr. Tamala Julian this AM for a follow up on the pneumonia. Congestive cough, has a moderate headache across the temple, took tylenol this AM and the headache eased off. Good appetite, fatigue improving. The patient is having 3 fractions instead of 5.                                  Set-up films were reviewed.                                 The chart was checked. Physical Findings. . .  Weight essentially stable. Lungs are clear to auscultation bilaterally. Heart has regular rate and rhythm. Impression . . . . . . . The patient is tolerating radiation. Plan . . . . . . . . . . . . Continue treatment as planned.  ________________________________   Blair Promise, PhD, MD  This document serves as a record of services personally performed by Gery Pray, MD. It was created on his behalf by Darcus Austin, a trained medical scribe. The creation of this record is based on the scribe's personal observations and the provider's statements to them. This document has been checked and approved by the attending provider.

## 2016-10-30 NOTE — Telephone Encounter (Signed)
Sorry, I was correct that Ruthe Mannan is not covered, but the preferred meds are Symbocort HFA and Breo Ellipta powder. Do you want to Rx one of these?

## 2016-10-30 NOTE — Telephone Encounter (Signed)
Pt.notified

## 2016-10-30 NOTE — Patient Instructions (Signed)
     IF you received an x-ray today, you will receive an invoice from [facility]. Please contact [facility] at [phone] with questions or concerns regarding your invoice.   IF you received labwork today, you will receive an invoice from [facility]. Please contact [facility] at [phone] with questions or concerns regarding your invoice.   Our billing staff will not be able to assist you with questions regarding bills from these companies.  You will be contacted with the lab results as soon as they are available. The fastest way to get your results is to activate your My Chart account. Instructions are located on the last page of this paperwork. If you have not heard from us regarding the results in 2 weeks, please contact this office.     

## 2016-10-30 NOTE — Progress Notes (Addendum)
SBRT LLL lung 1  completed, on Augmentin for 5 more days,  Saw her Dr. Tamala Julian this am follow up on pneumonia, congestive cough, has a moderate head ache across temple, took tylenol this am eased off stated patient,  Appetite good, fatigued improving , patien thinks she is having 5 txs, 3:44 PM BP (!) 156/95 (BP Location: Left Arm, Patient Position: Sitting, Cuff Size: Normal)   Pulse (!) 101   Temp 99 F (37.2 C) (Oral)   Resp 20   Wt 173 lb 6.4 oz (78.7 kg)   SpO2 96% Comment: room air  BMI 30.72 kg/m   Wt Readings from Last 3 Encounters:  10/30/16 173 lb 6.4 oz (78.7 kg)  10/30/16 173 lb (78.5 kg)  10/25/16 175 lb 9.6 oz (79.7 kg)

## 2016-10-30 NOTE — Progress Notes (Signed)
  Radiation Oncology         [phone] ________________________________  Name: Tammy Ball MRN: [id number]  Date: 10/30/2016  DOB: 1941-03-03  Stereotactic Body Radiotherapy Treatment Procedure Note  NARRATIVE:  Tammy Ball was brought to the stereotactic radiation treatment machine and placed supine on the CT couch. The patient was set up for stereotactic body radiotherapy on the body fix pillow.  3D TREATMENT PLANNING AND DOSIMETRY:  The patient's radiation plan was reviewed and approved prior to starting treatment.  It showed 3-dimensional radiation distributions overlaid onto the planning CT.  The [facility] for the target structures as well as the organs at risk were reviewed. The documentation of this is filed in the radiation oncology EMR.  SIMULATION VERIFICATION:  The patient underwent CT imaging on the treatment unit.  These were carefully aligned to document that the ablative radiation dose would cover the target volume and maximally spare the nearby organs at risk according to the planned distribution.  SPECIAL TREATMENT PROCEDURE: [name] received high dose ablative stereotactic body radiotherapy to the planned target volume without unforeseen complications. Treatment was delivered uneventfully. The high doses associated with stereotactic body radiotherapy and the significant potential risks require careful treatment set up and patient monitoring constituting a special treatment procedure   STEREOTACTIC TREATMENT MANAGEMENT:  Following delivery, the patient was evaluated clinically. The patient tolerated treatment without significant acute effects, and was discharged to home in stable condition.    PLAN: Continue treatment as planned.  ________________________________  Blair Promise, PhD, MD  This document serves as a record of services personally performed by Gery Pray, MD. It was created on his behalf by Darcus Austin, a trained medical scribe. The creation of  this record is based on the scribe's personal observations and the provider's statements to them. This document has been checked and approved by the attending provider.

## 2016-10-31 LAB — CBC WITH DIFFERENTIAL/PLATELET
Basophils Absolute: 0 10*3/uL (ref 0.0–0.2)
Basos: 0 %
EOS (ABSOLUTE): 0 10*3/uL (ref 0.0–0.4)
Eos: 0 %
Hematocrit: 39.6 % (ref 34.0–46.6)
Hemoglobin: 13.3 g/dL (ref 11.1–15.9)
Immature Grans (Abs): 0.1 10*3/uL (ref 0.0–0.1)
Immature Granulocytes: 1 %
Lymphocytes Absolute: 1.1 10*3/uL (ref 0.7–3.1)
Lymphs: 11 %
MCH: 30.4 pg (ref 26.6–33.0)
MCHC: 33.6 g/dL (ref 31.5–35.7)
MCV: 90 fL (ref 79–97)
Monocytes Absolute: 0.5 10*3/uL (ref 0.1–0.9)
Monocytes: 5 %
Neutrophils Absolute: 9 10*3/uL — ABNORMAL HIGH (ref 1.4–7.0)
Neutrophils: 83 %
Platelets: 406 10*3/uL — ABNORMAL HIGH (ref 150–379)
RBC: 4.38 x10E6/uL (ref 3.77–5.28)
RDW: 17.8 % — ABNORMAL HIGH (ref 12.3–15.4)
WBC: 10.8 10*3/uL (ref 3.4–10.8)

## 2016-10-31 LAB — COMPREHENSIVE METABOLIC PANEL
ALT: 14 IU/L (ref 0–32)
AST: 14 IU/L (ref 0–40)
Albumin/Globulin Ratio: 1.4 (ref 1.2–2.2)
Albumin: 4.5 g/dL (ref 3.5–4.8)
Alkaline Phosphatase: 103 IU/L (ref 39–117)
BUN/Creatinine Ratio: 24 (ref 12–28)
BUN: 29 mg/dL — ABNORMAL HIGH (ref 8–27)
Bilirubin Total: 0.3 mg/dL (ref 0.0–1.2)
CO2: 24 mmol/L (ref 18–29)
Calcium: 10 mg/dL (ref 8.7–10.3)
Chloride: 98 mmol/L (ref 96–106)
Creatinine, Ser: 1.23 mg/dL — ABNORMAL HIGH (ref 0.57–1.00)
GFR calc Af Amer: 50 mL/min/{1.73_m2} — ABNORMAL LOW (ref >59)
GFR calc non Af Amer: 44 mL/min/{1.73_m2} — ABNORMAL LOW (ref >59)
Globulin, Total: 3.3 g/dL (ref 1.5–4.5)
Glucose: 111 mg/dL — ABNORMAL HIGH (ref 65–99)
Potassium: 4.8 mmol/L (ref 3.5–5.2)
Sodium: 143 mmol/L (ref 134–144)
Total Protein: 7.8 g/dL (ref 6.0–8.5)

## 2016-10-31 MED ORDER — BUDESONIDE-FORMOTEROL FUMARATE 160-4.5 MCG/ACT IN AERO: 2.0000 | INHALATION_SPRAY | Freq: Two times a day (BID) | RESPIRATORY_TRACT | 3 refills | Status: DC

## 2016-10-31 NOTE — Telephone Encounter (Signed)
Yes, I sent a script for Symbicort. She may be reluctant to use this given the cost if I remember our discussion correctly while she was here. But she definitely needs something. Hope she fills it but please let her know that her insurance will cover this.

## 2016-11-01 ENCOUNTER — Ambulatory Visit
Admission: RE | Admit: 2016-11-01 | Discharge: 2016-11-01 | Disposition: A | Discharge status: Home / Self Care | Payer: Medicare Other | Source: Ambulatory Visit | Attending: Radiation Oncology | Admitting: Radiation Oncology

## 2016-11-01 DIAGNOSIS — C7802 Secondary malignant neoplasm of left lung: Secondary | ICD-10-CM | POA: Diagnosis not present

## 2016-11-01 DIAGNOSIS — M546 Pain in thoracic spine: Secondary | ICD-10-CM | POA: Diagnosis not present

## 2016-11-01 DIAGNOSIS — G8929 Other chronic pain: Secondary | ICD-10-CM | POA: Diagnosis not present

## 2016-11-01 DIAGNOSIS — Z923 Personal history of irradiation: Secondary | ICD-10-CM | POA: Diagnosis not present

## 2016-11-01 DIAGNOSIS — C3431 Malignant neoplasm of lower lobe, right bronchus or lung: Secondary | ICD-10-CM | POA: Diagnosis not present

## 2016-11-01 DIAGNOSIS — Z510 Encounter for antineoplastic radiation therapy: Secondary | ICD-10-CM | POA: Diagnosis not present

## 2016-11-01 DIAGNOSIS — C3491 Malignant neoplasm of unspecified part of right bronchus or lung: Secondary | ICD-10-CM

## 2016-11-01 NOTE — Progress Notes (Signed)
  Radiation Oncology         [phone] ________________________________  Name: KAWEHI HOSTETTER MRN: [id number]  Date: 11/01/2016  DOB: 1940-12-19  Stereotactic Body Radiotherapy Treatment Procedure Note  NARRATIVE:  Tammy Ball was brought to the stereotactic radiation treatment machine and placed supine on the CT couch. The patient was set up for stereotactic body radiotherapy on the body fix pillow.  3D TREATMENT PLANNING AND DOSIMETRY:  The patient's radiation plan was reviewed and approved prior to starting treatment.  It showed 3-dimensional radiation distributions overlaid onto the planning CT.  The [facility] for the target structures as well as the organs at risk were reviewed. The documentation of this is filed in the radiation oncology EMR.  SIMULATION VERIFICATION:  The patient underwent CT imaging on the treatment unit.  These were carefully aligned to document that the ablative radiation dose would cover the target volume and maximally spare the nearby organs at risk according to the planned distribution.  SPECIAL TREATMENT PROCEDURE: [name] received high dose ablative stereotactic body radiotherapy to the planned target volume without unforeseen complications. Treatment was delivered uneventfully. The high doses associated with stereotactic body radiotherapy and the significant potential risks require careful treatment set up and patient monitoring constituting a special treatment procedure   STEREOTACTIC TREATMENT MANAGEMENT:  Following delivery, the patient was evaluated clinically. The patient tolerated treatment without significant acute effects, and was discharged to home in stable condition.    PLAN: Continue treatment as planned.  ________________________________  Blair Promise, PhD, MD

## 2016-11-06 ENCOUNTER — Ambulatory Visit: Admission: RE | Admit: 2016-11-06 | Payer: Medicare Other | Source: Ambulatory Visit | Admitting: Radiation Oncology

## 2016-11-06 ENCOUNTER — Ambulatory Visit
Admission: RE | Admit: 2016-11-06 | Discharge: 2016-11-06 | Disposition: A | Discharge status: Home / Self Care | Payer: Medicare Other | Source: Ambulatory Visit | Attending: Radiation Oncology | Admitting: Radiation Oncology

## 2016-11-06 DIAGNOSIS — C3432 Malignant neoplasm of lower lobe, left bronchus or lung: Secondary | ICD-10-CM | POA: Diagnosis not present

## 2016-11-06 DIAGNOSIS — G8929 Other chronic pain: Secondary | ICD-10-CM | POA: Diagnosis not present

## 2016-11-06 DIAGNOSIS — C3431 Malignant neoplasm of lower lobe, right bronchus or lung: Secondary | ICD-10-CM | POA: Diagnosis not present

## 2016-11-06 DIAGNOSIS — Z510 Encounter for antineoplastic radiation therapy: Secondary | ICD-10-CM | POA: Diagnosis not present

## 2016-11-06 DIAGNOSIS — C7802 Secondary malignant neoplasm of left lung: Secondary | ICD-10-CM | POA: Diagnosis not present

## 2016-11-06 DIAGNOSIS — M546 Pain in thoracic spine: Secondary | ICD-10-CM | POA: Diagnosis not present

## 2016-11-06 DIAGNOSIS — Z923 Personal history of irradiation: Secondary | ICD-10-CM | POA: Diagnosis not present

## 2016-11-08 ENCOUNTER — Ambulatory Visit: Payer: Medicare Other | Admitting: Internal Medicine

## 2016-11-08 ENCOUNTER — Encounter: Payer: Self-pay | Admitting: Internal Medicine

## 2016-11-08 ENCOUNTER — Ambulatory Visit: Payer: Medicare Other | Admitting: Radiation Oncology

## 2016-11-13 ENCOUNTER — Ambulatory Visit: Payer: Medicare Other | Admitting: Radiation Oncology

## 2016-11-14 ENCOUNTER — Ambulatory Visit (INDEPENDENT_AMBULATORY_CARE_PROVIDER_SITE_OTHER): Payer: Medicare Other | Admitting: Family Medicine

## 2016-11-14 ENCOUNTER — Ambulatory Visit (INDEPENDENT_AMBULATORY_CARE_PROVIDER_SITE_OTHER): Payer: Medicare Other

## 2016-11-14 ENCOUNTER — Encounter: Payer: Self-pay | Admitting: Family Medicine

## 2016-11-14 ENCOUNTER — Encounter: Payer: Self-pay | Admitting: Radiation Oncology

## 2016-11-14 VITALS — BP 140/80 | HR 88 | Temp 98.9°F | Resp 18 | Ht 63.0 in | Wt 171.0 lb

## 2016-11-14 DIAGNOSIS — M4854XA Collapsed vertebra, not elsewhere classified, thoracic region, initial encounter for fracture: Secondary | ICD-10-CM

## 2016-11-14 DIAGNOSIS — S22000A Wedge compression fracture of unspecified thoracic vertebra, initial encounter for closed fracture: Secondary | ICD-10-CM

## 2016-11-14 DIAGNOSIS — M546 Pain in thoracic spine: Secondary | ICD-10-CM | POA: Diagnosis not present

## 2016-11-14 DIAGNOSIS — J22 Unspecified acute lower respiratory infection: Secondary | ICD-10-CM

## 2016-11-14 DIAGNOSIS — J441 Chronic obstructive pulmonary disease with (acute) exacerbation: Secondary | ICD-10-CM

## 2016-11-14 DIAGNOSIS — R05 Cough: Secondary | ICD-10-CM | POA: Diagnosis not present

## 2016-11-14 DIAGNOSIS — C3491 Malignant neoplasm of unspecified part of right bronchus or lung: Secondary | ICD-10-CM

## 2016-11-14 DIAGNOSIS — S22030A Wedge compression fracture of third thoracic vertebra, initial encounter for closed fracture: Secondary | ICD-10-CM | POA: Diagnosis not present

## 2016-11-14 LAB — POCT INFLUENZA A/B
Influenza A, POC: NEGATIVE
Influenza B, POC: NEGATIVE

## 2016-11-14 MED ORDER — ALBUTEROL SULFATE (2.5 MG/3ML) 0.083% IN NEBU
2.5000 mg | INHALATION_SOLUTION | Freq: Once | RESPIRATORY_TRACT | Status: AC
  Administered 2016-11-14: 2.5 mg via RESPIRATORY_TRACT

## 2016-11-14 MED ORDER — TIZANIDINE HCL 2 MG PO CAPS: 2.0000 mg | ORAL_CAPSULE | Freq: Three times a day (TID) | ORAL | 0 refills | Status: DC

## 2016-11-14 MED ORDER — IPRATROPIUM BROMIDE 0.02 % IN SOLN
0.5000 mg | Freq: Once | RESPIRATORY_TRACT | Status: AC
  Administered 2016-11-14: 0.5 mg via RESPIRATORY_TRACT

## 2016-11-14 MED ORDER — FLUTICASONE-SALMETEROL 250-50 MCG/DOSE IN AEPB: 1.0000 | INHALATION_SPRAY | Freq: Two times a day (BID) | RESPIRATORY_TRACT | 11 refills | Status: DC

## 2016-11-14 MED ORDER — PREDNISONE 20 MG PO TABS: ORAL_TABLET | ORAL | 0 refills | Status: DC

## 2016-11-14 MED ORDER — CEFDINIR 300 MG PO CAPS: 600.0000 mg | ORAL_CAPSULE | Freq: Every day | ORAL | 0 refills | Status: DC

## 2016-11-14 NOTE — Patient Instructions (Addendum)
1. Increase nebulizer to four times daily. 2. Increase tramadol to 1-2 four times daily as needed for pain.  3. Zanaflex is muscle relaxer -- 1 tablet three times per day.    IF you received an x-ray today, you will receive an invoice from [facility]. Please contact [facility] at [phone] with questions or concerns regarding your invoice.   IF you received labwork today, you will receive an invoice from [place]. Please contact [facility] at [phone] with questions or concerns regarding your invoice.   Our billing staff will not be able to assist you with questions regarding bills from these companies.  You will be contacted with the lab results as soon as they are available. The fastest way to get your results is to activate your My Chart account. Instructions are located on the last page of this paperwork. If you have not heard from [place] regarding the results in 2 weeks, please contact this office.

## 2016-11-14 NOTE — Progress Notes (Signed)
Subjective:    Patient ID: Tammy Ball, female    DOB: [birth year], 76 y.o.   MRN: [id number]  11/14/2016  Back Pain; Cough; and Wheezing   HPI This 76 y.o. female presents for evaluation of back pain, cough, wheezing.  Still have wheezing and coughing with green sputum.  Having middle back on R upper thoracic region and radiates into R breast.  Onset of upper R back pain developed five days.  Low grade fever for two days; 99.3.  +sweats; no chills.  No headache.  No ear pain; no sore throat.  +rhinorrhea one week ago but has resolved now.  No nasal congestion.  Some PND.  Bloody nose in morning; doing humidifier and vaseline.  Using nasal saline.  +coughing a lot; lots of wheezing which worsens when lies down.  +SOB with ambulation.  No swelling; no chest pain.  No v/d.  +nausea.  Taking Tramadol and Tylenol.  No cough medication; ran out.   Completed Augmentin; filled Dulera. Finished Prednisone.  New insurance only covers Advair.  No sciatica pain now; resolved with steroids. If tries to sleep on L side, restarts/triggers.  Nebulizer using at nighttime when hypoxic at 88%; restarted nebulizer bid for past four days.  Pulse oximetry ranges 94-98%.  Oxygen level dropped again on 11/09/2016 with onset of illness. Pleuritic pain in R upper back. Only had 3 radiation treatments; released since 11/06/16.  Has been taking two Tramadol twice daily which lessens pain.  Improved from last treatment for pneumonia.   Review of Systems  Constitutional: Positive for chills, diaphoresis and fever. Negative for fatigue.  HENT: Positive for congestion, postnasal drip, rhinorrhea and voice change.   Eyes: Negative for visual disturbance.  Respiratory: Positive for cough, shortness of breath and wheezing.   Cardiovascular: Negative for chest pain, palpitations and leg swelling.  Gastrointestinal: Negative for abdominal pain, constipation, diarrhea, nausea and vomiting.  Endocrine: Negative for cold intolerance,  heat intolerance, polydipsia, polyphagia and polyuria.  Musculoskeletal: Positive for back pain.  Neurological: Negative for dizziness, tremors, seizures, syncope, facial asymmetry, speech difficulty, weakness, light-headedness, numbness and headaches.    Past Medical History:  Diagnosis Date  . Anemia    was while doing chemo  . Anxiety   . Asthma   . Chemotherapy-induced neuropathy ([place]) 11/01/2015  . Claustrophobia   . COPD (chronic obstructive pulmonary disease) ([place])   . Depression   . Encounter for antineoplastic chemotherapy 02/09/2016  . Glaucoma   . Headache    prior to menopause  . Heart murmur   . History of echocardiogram    Echo 2/17: EF 36-64%, grade 1 diastolic dysfunction, mild MR, trivial pericardial effusion  . History of nuclear stress test    Myoview 2/17: no ischemia or scar, EF 79%; low risk  . Hyperlipidemia   . Hypertension   . Insomnia 05/16/2016  . Non-small cell carcinoma of right lung, stage 2 ([place]) 10/03/2015  . Radiation 02/29/16-04/10/16   50.4 Gy to right central chest  . Renal cell carcinoma ([place])    L nephrectomy  in 2012   Past Surgical History:  Procedure Laterality Date  . BACK SURGERY     cervical 1991  . EYE SURGERY    . kidney cancer    . NEPHRECTOMY    . SPINE SURGERY    . TUBAL LIGATION    . VIDEO ASSISTED THORACOSCOPY (VATS)/WEDGE RESECTION Right 01/18/2016   Procedure: VIDEO ASSISTED THORACOSCOPY (VATS)/LUNG RESECTION, THOROCOTOMY, RIGHT UPPER LOBECTOMY, LYMPH NODE  DISSECTION, PLACEMENT OF ON Q;  Surgeon: [name], MD;  Location: [facility];  Service: Thoracic;  Laterality: Right;  Marland Kitchen VIDEO BRONCHOSCOPY Bilateral 09/20/2015   Procedure: VIDEO BRONCHOSCOPY WITHOUT FLUORO;  Surgeon: Rigoberto Noel, MD;  Location: [facility];  Service: Cardiopulmonary;  Laterality: Bilateral;  . VIDEO BRONCHOSCOPY N/A 01/18/2016   Procedure: VIDEO BRONCHOSCOPY;  Surgeon: [name], MD;  Location: [facility];  Service: Thoracic;  Laterality: N/A;    Allergies  Allergen Reactions  . Bee Venom Anaphylaxis, Shortness Of Breath, Swelling and Other (See Comments)    Swelling at site   . Amlodipine Swelling and Other (See Comments)    Swelling of the ankles and hands   . Levofloxacin Other (See Comments)    Joint pain   . Hctz [Hydrochlorothiazide] Palpitations and Other (See Comments)    Sweating     Social History   Social History  . Marital status: Widowed    Spouse name: N/A  . Number of children: N/A  . Years of education: N/A   Occupational History  . Not on file.   Social History Main Topics  . Smoking status: Former Smoker    Packs/day: 0.50    Years: 50.00    Types: Cigarettes    Quit date: 03/16/2011  . Smokeless tobacco: Never Used  . Alcohol use No  . Drug use: No  . Sexual activity: Not Currently   Other Topics Concern  . Not on file   Social History Narrative   Marital status: divorced; not dating.      Children: 4 children; 3 grandchildren adult; 4 gg.      Lives: alone in house      Employment: full time substance abuse counselor; [employer].      Tobacco: quit smoking 2012; smoked 45 years      Alcohol: none      Drugs: none      ADLs: independent with ADLs; drives.       Advanced Directives: YES: HCPOA: Nicholas Martinez/son.  FULL CODE but no prolonged measures.      Occupation: Substance Abuse Estate agent   No exercise** Merged History Encounter **       ** Data from: 12/14/11 Enc Dept: [facility]       ** Data from: 12/17/11 Enc Dept: [facility]   Substance abuse counselor   Husband deceased   4 great grandchildren   Son works in same substance abuse counseling center as patient   Family History  Problem Relation Age of Onset  . Heart disease Sister   . Obesity Brother   . Heart attack Daughter 35    s/p CABG  . Glaucoma Daughter   . Breast cancer Sister   . Birth defects Sister   . Cancer Mother     Bladder Cancer  .  Hypertension Mother   . CAD Mother 25  . Cancer Maternal Grandmother        Objective:    BP 140/80 (BP Location: Right Arm, Patient Position: Sitting, Cuff Size: Large)   Pulse 88   Temp 98.9 F (37.2 C) (Oral)   Resp 18   Ht '5\' 3"'$  (1.6 m)   Wt 171 lb (77.6 kg)   SpO2 90%   BMI 30.29 kg/m  Physical Exam  Constitutional: She is oriented to person, place, and time. She appears well-developed and well-nourished. No distress.  HENT:  Head: Normocephalic  and atraumatic.  Right Ear: External ear normal.  Left Ear: External ear normal.  Nose: Nose normal.  Mouth/Throat: Oropharynx is clear and moist.  Eyes: Conjunctivae and EOM are normal. Pupils are equal, round, and reactive to light.  Neck: Normal range of motion. Neck supple. Carotid bruit is not present. No thyromegaly present.  Cardiovascular: Normal rate, regular rhythm, normal heart sounds and intact distal pulses.  Exam reveals no gallop and no friction rub.   No murmur heard. Pulmonary/Chest: Effort normal. No respiratory distress. She has wheezes. She has no rales.  Abdominal: Soft. Bowel sounds are normal. She exhibits no distension and no mass. There is no tenderness. There is no rebound and no guarding.  Musculoskeletal:       Thoracic back: She exhibits tenderness and pain. She exhibits normal range of motion, no bony tenderness and no spasm.  Lymphadenopathy:    She has no cervical adenopathy.  Neurological: She is alert and oriented to person, place, and time. No cranial nerve deficit.  Skin: Skin is warm and dry. No rash noted. She is not diaphoretic. No erythema. No pallor.  Psychiatric: She has a normal mood and affect. Her behavior is normal.        Assessment & Plan:   1. COPD exacerbation ([place])   2. Thoracic spine pain   3. Compression fracture of body of thoracic vertebra ([facility])   4. Non-small cell carcinoma of right lung, stage 2 ([place])   5. Lower respiratory infection    -recurrent; treat with  Albuterol every six hours scheduled. -rx for Omnicef provided; obtain CXR. -repeat thoracic spine films to rule out new compression fracture. -currently undergoing treatment for Stage 2 lung cancer non-small cell carcinoma per Dr. Julien Nordmann. -increase Tramadol use to qid; rx for Zanaflex provided.  Orders Placed This Encounter  Procedures  . DG Chest 2 View    Standing Status:   Future    Number of Occurrences:   1    Standing Expiration Date:   11/14/2017    Order Specific Question:   Reason for Exam (SYMPTOM  OR DIAGNOSIS REQUIRED)    Answer:   cough, R sided back pain thoracic; COPD exacerbation, lung cancer    Order Specific Question:   Preferred imaging location?    Answer:   External  . DG Thoracic Spine 2 View    Standing Status:   Future    Number of Occurrences:   1    Standing Expiration Date:   11/14/2017    Order Specific Question:   Reason for Exam (SYMPTOM  OR DIAGNOSIS REQUIRED)    Answer:   cough, R sided back pain thoracic; COPD exacerbation, lung cancer    Order Specific Question:   Preferred imaging location?    Answer:   External  . POCT Influenza A/B   Meds ordered this encounter  Medications  . albuterol (PROVENTIL) (2.5 MG/3ML) 0.083% nebulizer solution 2.5 mg  . ipratropium (ATROVENT) nebulizer solution 0.5 mg  . Fluticasone-Salmeterol (ADVAIR) 250-50 MCG/DOSE AEPB    Sig: Inhale 1 puff into the lungs 2 (two) times daily.    Dispense:  60 each    Refill:  11  . cefdinir (OMNICEF) 300 MG capsule    Sig: Take 2 capsules (600 mg total) by mouth daily.    Dispense:  20 capsule    Refill:  0  . tizanidine (ZANAFLEX) 2 MG capsule    Sig: Take 1 capsule (2 mg total) by mouth 3 (three) times  daily.    Dispense:  30 capsule    Refill:  0  . predniSONE (DELTASONE) 20 MG tablet    Sig: Take 3 PO QAM x 1 day, 2 PO QAM x 5 days, 1 PO QAM x 5 days    Dispense:  18 tablet    Refill:  0    No Follow-up on file.   Yaroslav Gombos Elayne Guerin, M.D. [facility] [address] [place], [place]  [address] [phone] phone [phone] fax

## 2016-11-14 NOTE — Progress Notes (Signed)
  Radiation Oncology         [phone] ________________________________  Name: Tammy Ball MRN: [id number]  Date: 11/14/2016  DOB: [birth year]  End of Treatment Note  Diagnosis:  :  Stage IIIA (ypT2a, pN2, M0) non-small cell carcinoma (squamous cell carcinoma) of the right lower lung, now with a solitary left lower lobe lung nodule ([place] metastasis)  Indication for treatment: Curative      Radiation treatment dates:   10/30/16-11/06/16  Site/dose:   Left lower lung/ 54 Gy in 3 fractions  Beams/energy:   SBRT SRT-3D / 6FFF  Narrative: The patient tolerated radiation treatment relatively well. Some fatigue related to concurrent pneumonia and antibiotic therapy.  Plan: The patient has completed radiation treatment. The patient will return to radiation oncology clinic for routine followup in one month. I advised them to call or return sooner if they have any questions or concerns related to their recovery or treatment.  -----------------------------------  Blair Promise, PhD, MD

## 2016-11-20 ENCOUNTER — Encounter: Payer: Self-pay | Admitting: Radiation Oncology

## 2016-11-21 ENCOUNTER — Encounter: Payer: Self-pay | Admitting: Oncology

## 2016-11-21 NOTE — Telephone Encounter (Signed)
[place] patient. Forwarding to Elmo Putt, RN.

## 2016-11-22 ENCOUNTER — Encounter: Payer: Self-pay | Admitting: Oncology

## 2016-11-23 ENCOUNTER — Encounter: Payer: Self-pay | Admitting: Medical Oncology

## 2016-12-08 ENCOUNTER — Ambulatory Visit (INDEPENDENT_AMBULATORY_CARE_PROVIDER_SITE_OTHER): Payer: Medicare Other

## 2016-12-08 ENCOUNTER — Ambulatory Visit (INDEPENDENT_AMBULATORY_CARE_PROVIDER_SITE_OTHER): Payer: Medicare Other | Admitting: Family Medicine

## 2016-12-08 VITALS — BP 140/80 | HR 90 | Temp 98.1°F | Resp 17 | Ht 63.75 in | Wt 170.0 lb

## 2016-12-08 DIAGNOSIS — M546 Pain in thoracic spine: Secondary | ICD-10-CM | POA: Diagnosis not present

## 2016-12-08 DIAGNOSIS — Z8781 Personal history of (healed) traumatic fracture: Secondary | ICD-10-CM

## 2016-12-08 DIAGNOSIS — C3491 Malignant neoplasm of unspecified part of right bronchus or lung: Secondary | ICD-10-CM

## 2016-12-08 DIAGNOSIS — S239XXD Sprain of unspecified parts of thorax, subsequent encounter: Secondary | ICD-10-CM

## 2016-12-08 DIAGNOSIS — R0781 Pleurodynia: Secondary | ICD-10-CM | POA: Diagnosis not present

## 2016-12-08 DIAGNOSIS — F5102 Adjustment insomnia: Secondary | ICD-10-CM | POA: Diagnosis not present

## 2016-12-08 MED ORDER — TIZANIDINE HCL 2 MG PO CAPS: 2.0000 mg | ORAL_CAPSULE | Freq: Three times a day (TID) | ORAL | 0 refills | Status: DC

## 2016-12-08 MED ORDER — TRAMADOL HCL 50 MG PO TABS: 50.0000 mg | ORAL_TABLET | Freq: Four times a day (QID) | ORAL | Status: DC | PRN

## 2016-12-08 MED ORDER — TEMAZEPAM 15 MG PO CAPS: 15.0000 mg | ORAL_CAPSULE | Freq: Every evening | ORAL | 0 refills | Status: DC | PRN

## 2016-12-08 NOTE — Patient Instructions (Signed)
     IF you received an x-ray today, you will receive an invoice from [facility]. Please contact [facility] at [phone] with questions or concerns regarding your invoice.   IF you received labwork today, you will receive an invoice from [facility]. Please contact [facility] at [phone] with questions or concerns regarding your invoice.   Our billing staff will not be able to assist you with questions regarding bills from these companies.  You will be contacted with the lab results as soon as they are available. The fastest way to get your results is to activate your My Chart account. Instructions are located on the last page of this paperwork. If you have not heard from us regarding the results in 2 weeks, please contact this office.     

## 2016-12-08 NOTE — Progress Notes (Signed)
Subjective:    Patient ID: Tammy Ball, female    DOB: August 17, 1941, 76 y.o.   MRN: [id number]  12/08/2016  Back Pain (Thoracic, X several months)   HPI This 76 y.o. female presents for evaluation of persistent thoracic back pain. No improvement; has been taking Tramadol tid but ran out.     Dr. Arneta Cliche with [facility] in 2001 with herniated disc in neck cervical.    No other orthopedic specialist since then.  [place] in 2006 at [facility]. Ran out of Flexeril in ten days; ran out of Tramadol yesterday.  Requesting refill of Temazepam; using sparingly for insomnia.  Breathing is much improved since last visit; no persistent coughing; less wheezing.   Immunization History  Administered Date(s) Administered  . Hepatitis B 11/12/1998  . Influenza Split 09/12/2012  . Influenza,inj,Quad PF,36+ Mos 07/18/2013  . Influenza-Unspecified 07/29/2014, 07/23/2015, 08/25/2016  . Pneumococcal Conjugate-13 07/29/2015  . Pneumococcal Polysaccharide-23 11/12/2000, 10/23/2012  . Pneumococcal-Unspecified 10/23/2012  . Tdap 12/02/2013  . Zoster 12/13/2010   BP Readings from Last 3 Encounters:  12/08/16 140/80  11/14/16 140/80  10/30/16 (!) 156/95   Wt Readings from Last 3 Encounters:  12/08/16 170 lb (77.1 kg)  11/14/16 171 lb (77.6 kg)  10/30/16 173 lb 6.4 oz (78.7 kg)    Review of Systems  Constitutional: Negative for chills, diaphoresis, fatigue and fever.  Eyes: Negative for visual disturbance.  Respiratory: Negative for cough and shortness of breath.   Cardiovascular: Negative for chest pain, palpitations and leg swelling.  Gastrointestinal: Negative for abdominal pain, constipation, diarrhea, nausea and vomiting.  Endocrine: Negative for cold intolerance, heat intolerance, polydipsia, polyphagia and polyuria.  Musculoskeletal: Positive for back pain and myalgias.  Neurological: Negative for dizziness, tremors, seizures, syncope, facial asymmetry, speech difficulty,  weakness, light-headedness, numbness and headaches.  Psychiatric/Behavioral: Positive for sleep disturbance.    Past Medical History:  Diagnosis Date  . Anemia    was while doing chemo  . Anxiety   . Asthma   . Chemotherapy-induced neuropathy ([place]) 11/01/2015  . Claustrophobia   . COPD (chronic obstructive pulmonary disease) ([place])   . Depression   . Encounter for antineoplastic chemotherapy 02/09/2016  . Glaucoma   . Headache    prior to menopause  . Heart murmur   . History of echocardiogram    Echo 2/17: EF 54-27%, grade 1 diastolic dysfunction, mild MR, trivial pericardial effusion  . History of nuclear stress test    Myoview 2/17: no ischemia or scar, EF 79%; low risk  . Hyperlipidemia   . Hypertension   . Insomnia 05/16/2016  . Non-small cell carcinoma of right lung, stage 2 ([place]) 10/03/2015  . Radiation 02/29/16-04/10/16   50.4 Gy to right central chest  . Renal cell carcinoma ([place])    L nephrectomy  in 2012   Past Surgical History:  Procedure Laterality Date  . BACK SURGERY     cervical 1991  . EYE SURGERY    . kidney cancer    . NEPHRECTOMY    . SPINE SURGERY    . TUBAL LIGATION    . VIDEO ASSISTED THORACOSCOPY (VATS)/WEDGE RESECTION Right 01/18/2016   Procedure: VIDEO ASSISTED THORACOSCOPY (VATS)/LUNG RESECTION, THOROCOTOMY, RIGHT UPPER LOBECTOMY, LYMPH NODE DISSECTION, PLACEMENT OF ON Q;  Surgeon: [name], MD;  Location: [facility];  Service: Thoracic;  Laterality: Right;  Marland Kitchen VIDEO BRONCHOSCOPY Bilateral 09/20/2015   Procedure: VIDEO BRONCHOSCOPY WITHOUT FLUORO;  Surgeon: Rigoberto Noel, MD;  [facility];  Service: Cardiopulmonary;  Laterality: Bilateral;  . VIDEO BRONCHOSCOPY N/A 01/18/2016   Procedure: VIDEO BRONCHOSCOPY;  Surgeon: [name], MD;  [facility];  Service: Thoracic;  Laterality: N/A;   Allergies  Allergen Reactions  . Bee Venom Anaphylaxis, Shortness Of Breath, Swelling and Other (See Comments)    Swelling at site   .  Amlodipine Swelling and Other (See Comments)    Swelling of the ankles and hands   . Levofloxacin Other (See Comments)    Joint pain   . Hctz [Hydrochlorothiazide] Palpitations and Other (See Comments)    Sweating     Social History   Social History  . Marital status: Widowed    Spouse name: N/A  . Number of children: N/A  . Years of education: N/A   Occupational History  . Not on file.   Social History Main Topics  . Smoking status: Former Smoker    Packs/day: 0.50    Years: 50.00    Types: Cigarettes    Quit date: 03/16/2011  . Smokeless tobacco: Never Used  . Alcohol use No  . Drug use: No  . Sexual activity: Not Currently   Other Topics Concern  . Not on file   Social History Narrative   Marital status: divorced; not dating.      Children: 4 children; 3 grandchildren adult; 4 gg.      Lives: alone in house      Employment: full time substance abuse counselor; [employer].      Tobacco: quit smoking 2012; smoked 45 years      Alcohol: none      Drugs: none      ADLs: independent with ADLs; drives.       Advanced Directives: YES: HCPOA: Nicholas Martinez/son.  FULL CODE but no prolonged measures.      Occupation: Substance Abuse Estate agent   No exercise** Merged History Encounter **       ** Data from: 12/14/11 Enc Dept: [facility]       ** Data from: 12/17/11 Enc Dept: [facility]   Substance abuse counselor   Husband deceased   4 great grandchildren   Son works in same substance abuse counseling center as patient   Family History  Problem Relation Age of Onset  . Heart disease Sister   . Obesity Brother   . Heart attack Daughter 32    s/p CABG  . Glaucoma Daughter   . Breast cancer Sister   . Birth defects Sister   . Cancer Mother     Bladder Cancer  . Hypertension Mother   . CAD Mother 66  . Cancer Maternal Grandmother        Objective:    BP 140/80   Pulse 90   Temp 98.1 F (36.7 C)  (Oral)   Resp 17   Ht 5' 3.75" (1.619 m)   Wt 170 lb (77.1 kg)   SpO2 95%   BMI 29.41 kg/m  Physical Exam  Constitutional: She is oriented to person, place, and time. She appears well-developed and well-nourished. No distress.  HENT:  Head: Normocephalic and atraumatic.  Right Ear: External ear normal.  Left Ear: External ear normal.  Nose: Nose normal.  Mouth/Throat: Oropharynx is clear and moist.  Eyes: Conjunctivae and EOM are normal. Pupils are equal, round, and reactive to light.  Neck: Normal range of motion. Neck supple. Carotid bruit is not present. No thyromegaly  present.  Cardiovascular: Normal rate, regular rhythm, normal heart sounds and intact distal pulses.  Exam reveals no gallop and no friction rub.   No murmur heard. Pulmonary/Chest: Effort normal and breath sounds normal. She has no wheezes. She has no rales.  Abdominal: Soft. Bowel sounds are normal. She exhibits no distension and no mass. There is no tenderness. There is no rebound and no guarding.  Musculoskeletal:       Thoracic back: She exhibits decreased range of motion, tenderness, bony tenderness, pain and spasm.  Thoracic spine: +TTP midline thoracic spine which is new since last visit.  +TTP paraspinal region on L.  Pain with ROM.   Lymphadenopathy:    She has no cervical adenopathy.  Neurological: She is alert and oriented to person, place, and time. No cranial nerve deficit.  Skin: Skin is warm and dry. No rash noted. She is not diaphoretic. No erythema. No pallor.  Psychiatric: She has a normal mood and affect. Her behavior is normal.   Results for orders placed or performed in visit on 11/14/16  POCT Influenza A/B  Result Value Ref Range   Influenza A, POC Negative Negative   Influenza B, POC Negative Negative   No results found. Dg Chest 2 View  Result Date: 11/14/2016 CLINICAL DATA:  Cough, wheezing. EXAM: CHEST  2 VIEW COMPARISON:  Radiographs of October 25, 2016. FINDINGS: Stable  cardiomediastinal silhouette. Atherosclerosis of thoracic aorta is noted. No pneumothorax or pleural effusion is noted. Stable old compression deformity of upper thoracic vertebral body is noted. No acute pulmonary disease is noted. Postsurgical changes are again noted in the right hilum. IMPRESSION: No active cardiopulmonary disease. Electronically Signed   By: Marijo Conception, M.D.   On: 11/14/2016 16:07   Dg Ribs Unilateral W/chest Right  Result Date: 12/08/2016 CLINICAL DATA:  Thoracic back pain EXAM: RIGHT RIBS AND CHEST - 3+ VIEW COMPARISON:  11/14/2006 FINDINGS: No definite displaced rib fracture or focal rib abnormality. No chest wall subcutaneous emphysema or focal swelling. Mild hyperinflation without focal pneumonia, collapse or consolidation. Negative for edema, effusion or pneumothorax. Stable heart size and vascularity. Atherosclerosis noted of the aorta. Lower cervical fusion hardware evident. Normal bowel gas pattern. IMPRESSION: No acute finding by plain radiography.  Stable exam. Electronically Signed   By: Jerilynn Mages.  Shick M.D.   On: 12/08/2016 11:02   Dg Thoracic Spine 2 View  Result Date: 12/08/2016 CLINICAL DATA:  History of compression fracture. Right posterior rib pain. EXAM: THORACIC SPINE 2 VIEWS COMPARISON:  11/14/2016 FINDINGS: Moderate T3 compression fracture again noted, slightly progressed. No new compression fracture. Degenerative spurring throughout the thoracic spine. No malalignment. IMPRESSION: Chronic T3 compression fracture, slightly progressed since prior study. No acute fracture. Electronically Signed   By: Rolm Baptise M.D.   On: 12/08/2016 11:01   Dg Thoracic Spine 2 View  Result Date: 11/14/2016 CLINICAL DATA:  Acute right upper back pain. EXAM: THORACIC SPINE 2 VIEWS COMPARISON:  CT scan of October 25, 2016 and September 20, 2016. FINDINGS: Stable mild wedge compression deformity of T3 vertebral body is noted consistent with old fracture. No new fracture or  spondylolisthesis is noted. Disc spaces are well-maintained. IMPRESSION: Stable old T3 compression fracture is noted. No new abnormality is noted. Electronically Signed   By: Marijo Conception, M.D.   On: 11/14/2016 16:03      Assessment & Plan:   1. Thoracic back sprain, subsequent encounter   2. History of compression fracture of spine  3. Non-small cell carcinoma of right lung, stage 2 ([place])   4. Adjustment insomnia    -progression of compression fracture on plain films; rx for Tramadol and Zanaflex provided. -refer to ortho to see if patient is a candidate for kyphoplasty if pain persists. -s/p PET scan in 09/2016 and scheduled for follow-up with oncology in 01/2017; less likely bony mets to spine at site of compression fracture but not completely ruled out.  Will consider repeat bone scan or MRI thoracic spine to determine.   -refill of benzo for insomnia intermittently. -once follows up with oncology, will need to discuss treatment for osteoporosis with history of compression fracture. -compression fracture detected on 09/20/2016 CT chest.  No uptake in spine thoracic with PET scan on 10/03/16 mentioned. CT 10/2016 revealed progression of T2 compression fracture yet no evidence of bony metastasis.   Orders Placed This Encounter  Procedures  . DG Thoracic Spine 2 View    Standing Status:   Future    Number of Occurrences:   1    Standing Expiration Date:   12/08/2017    Order Specific Question:   Reason for Exam (SYMPTOM  OR DIAGNOSIS REQUIRED)    Answer:   thoracic spine pain R; history of T3 compression fx    Order Specific Question:   Preferred imaging location?    Answer:   External  . DG Ribs Unilateral W/Chest Right    Standing Status:   Future    Number of Occurrences:   1    Standing Expiration Date:   12/08/2017    Order Specific Question:   Reason for Exam (SYMPTOM  OR DIAGNOSIS REQUIRED)    Answer:   thoracic spine pain R; history of T3 compression fx    Order Specific  Question:   Preferred imaging location?    Answer:   External   Meds ordered this encounter  Medications  . temazepam (RESTORIL) 15 MG capsule    Sig: Take 1 capsule (15 mg total) by mouth at bedtime as needed for sleep.    Dispense:  30 capsule    Refill:  0  . tizanidine (ZANAFLEX) 2 MG capsule    Sig: Take 1 capsule (2 mg total) by mouth 3 (three) times daily.    Dispense:  60 capsule    Refill:  0  . traMADol (ULTRAM) 50 MG tablet    Sig: Take 1-2 tablets (50-100 mg total) by mouth every 6 (six) hours as needed for moderate pain. Reported on 05/24/2016    Dispense:  120 tablet    Refill:  02    No Follow-up on file.   Abrham Maslowski Elayne Guerin, M.D. Primary Care at [facility] [address] [place], [place]  [address] [phone] phone [phone] fax

## 2016-12-10 DIAGNOSIS — Z8781 Personal history of (healed) traumatic fracture: Secondary | ICD-10-CM | POA: Insufficient documentation

## 2016-12-10 NOTE — Telephone Encounter (Signed)
Have referral from primary care for back fx, is this something dr.newton can assess ? nitka / yates both are booked until march for new back, let me know I will make appt .   Thank you.

## 2016-12-11 ENCOUNTER — Other Ambulatory Visit: Payer: Self-pay | Admitting: Family Medicine

## 2016-12-11 NOTE — Telephone Encounter (Signed)
Refill was already placed by Dr. Tamala Julian on 12/08/2016.

## 2016-12-13 ENCOUNTER — Encounter (INDEPENDENT_AMBULATORY_CARE_PROVIDER_SITE_OTHER): Payer: Self-pay | Admitting: Physical Medicine and Rehabilitation

## 2016-12-13 ENCOUNTER — Ambulatory Visit (INDEPENDENT_AMBULATORY_CARE_PROVIDER_SITE_OTHER): Payer: Medicare Other | Admitting: Physical Medicine and Rehabilitation

## 2016-12-13 VITALS — BP 133/87 | HR 70

## 2016-12-13 DIAGNOSIS — M546 Pain in thoracic spine: Secondary | ICD-10-CM | POA: Diagnosis not present

## 2016-12-13 DIAGNOSIS — M791 Myalgia: Secondary | ICD-10-CM | POA: Diagnosis not present

## 2016-12-13 DIAGNOSIS — F411 Generalized anxiety disorder: Secondary | ICD-10-CM

## 2016-12-13 DIAGNOSIS — M7918 Myalgia, other site: Secondary | ICD-10-CM

## 2016-12-13 DIAGNOSIS — M4854XS Collapsed vertebra, not elsewhere classified, thoracic region, sequela of fracture: Secondary | ICD-10-CM | POA: Diagnosis not present

## 2016-12-13 MED ORDER — LORAZEPAM 0.5 MG PO TABS: ORAL_TABLET | ORAL | 0 refills | Status: DC

## 2016-12-13 NOTE — Progress Notes (Signed)
TATIYANA FOUCHER - 76 y.o. female MRN [id number]  Date of birth: 1941-09-16  Office Visit Note: Visit Date: 12/13/2016 PCP: Reginia Forts, MD Referred by: Wardell Honour, MD  Subjective: No chief complaint on file.  HPI: Mrs.Sproull is a very pleasant 76 year old female with chronic history of upper back and thoracic pain since August 2017. She reports no specific injury. She states that the "Pain has been unbearable." Recently underwent treatment for lung cancer. She underwent lobectomy and then after recurrence found in the upper lobe she underwent chemotherapy and radiation treatment. This was on the right. She also had multiple imaging studies. CT scan in November 2017 first saw compression fracture at T3 and related that this was new compared to prior radiographic images in August. She has not had a specific MRI of the thoracic spine but they have been following this with other scans due to the cancer. They have not felt like this is tumor related. She has had a recent DEXA scan that showed she had osteopenia but not osteoporosis. She states that since that DEXA scan was performed she has taken a lot of prednisone. She denies any new shortness of breath. She denies any specific night pain. She's had no fevers chills or night sweats. No unexpected weight loss. She reports doing fairly well with the chemotherapy. She has had prior cervical spine surgery by Dr. Cyndy Freeze. This was an ACDF performed in 2001. She denies any real low back or leg pain. She's had no focal weakness in the legs. She has been taking tramadol and Zanaflex with some relief. She states that most of her pain is with standing and bending. She can get comfortable with sitting and resting. She has some baseline depression and anxiety. She does take some medications at night for sleep. She has not had specific physical therapy.   Review of Systems  Constitutional: Negative for chills, fever, malaise/fatigue and weight loss.  HENT:  Negative for hearing loss and sinus pain.   Eyes: Negative for blurred vision, double vision and photophobia.  Respiratory: Positive for shortness of breath. Negative for cough.   Cardiovascular: Negative for chest pain, palpitations and leg swelling.  Gastrointestinal: Negative for abdominal pain, nausea and vomiting.  Genitourinary: Negative for flank pain.  Musculoskeletal: Positive for back pain. Negative for myalgias.  Skin: Negative for itching and rash.  Neurological: Positive for weakness. Negative for tremors and focal weakness.  Endo/Heme/Allergies: Negative.   Psychiatric/Behavioral: Negative for depression. The patient has insomnia.   All other systems reviewed and are negative.  Otherwise per HPI.  Assessment & Plan: Visit Diagnoses:  1. Pain in thoracic spine   2. Pre-operative anxiety   3. Non-traumatic compression fracture of third thoracic vertebra, sequela   4. Myofascial muscle pain     Plan: Findings:  Chronic worsening severe thoracic pain particularly in the upper thoracic area but also around the rhomboids and right scapula. This is a very complicated patient status post lobe resection and chemotherapy and radiation for recurrent lung cancer on the right. Exam is somewhat consistent with myofascial pain with trigger points in the rhomboids and infraspinatus and even teres minor. She clearly has some pain with rocking over the thoracic vertebral column. She has known compression fracture at T3 which has progressed somewhat over the last few months at least on the imaging available. We do not have a specific MRI to look at any chronicity at this point. This was a new finding however sometime between August and November  looking at the progression of imaging. Her symptoms are consistent both with thoracic compression fracture and myofascial pain in this area. She has had a prior cervical fusion. She has no radicular arm pain. I think the best approach is MRI of the thoracic  spine. She does have some anxiety and claustrophobia and we will get MRI of the thoracic spine. I do not think we need to get this with contrast given some baseline kidney issues and the fact that they are doing plenty of scans that would reveal metastatic disease. She wants to do this at [facility] and she works close by. I also will start her with physical therapy at [facility]. This would be for manual treatment and general treatment with known vertebral column C3 fracture. This would be for potential looking at TENS unit and manual treatment as well posture and strengthening. She is doing fairly well when I examined her I do not feel like a TLSO brace would be beneficial. That would likely limit her breathing which would not be great at this point with a history of lung cancer and resection. We discussed the issues with compression fractures as they do heal over time and usually quit hurting over time wishes father there there incredibly painful. I think tramadol is otherwise choice at this point if it does help her and she does tolerate this. Depending on what the thoracic MRI shows she might be a candidate for kyphoplasty. Obviously the upper thoracic region is difficult at times for kyphoplasty but we would refer her to interventional radiology for the procedure if the thoracic MRI shows still acute fracture. I spent more than 45 minutes speaking face-to-face with the patient with 50% of the time in counseling.    Meds & Orders:  Meds ordered this encounter  Medications  . LORazepam (ATIVAN) 0.5 MG tablet    Sig: Take 1 to 2 by mouth, 1-2 hours preprocedure. May repeat if necessary.    Dispense:  5 tablet    Refill:  0    Orders Placed This Encounter  Procedures  . MR THORACIC SPINE W WO CONTRAST  . Ambulatory referral to Physical Therapy    Follow-up: Return for MRI review after completion.   Procedures: No procedures performed  No notes on file   Clinical History: Thoracic  spine x-ray 12/08/2016 COMPARISON: 11/14/2016   FINDINGS: Moderate T3 compression fracture again noted, slightly progressed. No new compression fracture. Degenerative spurring throughout the thoracic spine. No malalignment.   IMPRESSION: Chronic T3 compression fracture, slightly progressed since prior study.   No acute fracture.  CT chest dated 09/20/2016 IMPRESSION: 1. Enlarging solid 10 mm left lower lobe pulmonary nodule, likely a pulmonary metastasis. 2. No definite additional sites of metastatic disease in the chest. 3. New mild superior T3 vertebral compression fracture with associated patchy sclerosis without discrete bone lesion, consistent with an acute/subacute compression fracture. 4. No evidence of local tumor recurrence at the right upper lobectomy site. Decreasing evolving postradiation change in the right upper parahilar lung. 5. Additional findings include aortic atherosclerosis, 1 vessel coronary atherosclerosis, mild to moderate centrilobular emphysema and small hiatal hernia. These results will be called to the ordering clinician or representative by the Radiologist Assistant, and communication documented in the PACS or zVision Dashboard.  She reports that she quit smoking about 5 years ago. Her smoking use included Cigarettes. She has a 25.00 pack-year smoking history. She has never used smokeless tobacco. No results for input(s): HGBA1C, LABURIC in the  last 8760 hours.  Objective:  VS:  HT:    WT:   BMI:     BP:133/87  HR:70bpm  TEMP: ( )  RESP:  Physical Exam  Ortho Exam Imaging: No results found.  Past Medical/Family/Surgical/Social History: Medications & Allergies reviewed per EMR Patient Active Problem List   Diagnosis Date Noted  . History of compression fracture of spine 12/10/2016  . Hypercalcemia 06/25/2016  . Insomnia 05/16/2016  . Pneumonitis, radiation ([place]) 04/11/2016  . S/P lobectomy of lung 01/18/2016  . Chemotherapy-induced  neuropathy ([place]) 11/01/2015  . Non-small cell carcinoma of right lung, stage 2 ([place]) 10/03/2015  . Morbid obesity ([place]) 10/01/2015  . Hyperlipidemia 08/14/2013  . Spondylolisthesis of lumbar region 07/03/2013  . Glaucoma 10/23/2012  . Essential hypertension 12/17/2011  . COPD GOLD II  12/17/2011  . Hearing loss 12/17/2011  . Renal cell cancer ([place]) 12/17/2011  . Sciatica of left side 12/17/2011  . Depression 12/17/2011   Past Medical History:  Diagnosis Date  . Anemia    was while doing chemo  . Anxiety   . Asthma   . Chemotherapy-induced neuropathy ([place]) 11/01/2015  . Claustrophobia   . COPD (chronic obstructive pulmonary disease) ([place])   . Depression   . Encounter for antineoplastic chemotherapy 02/09/2016  . Glaucoma   . Headache    prior to menopause  . Heart murmur   . History of echocardiogram    Echo 2/17: EF 33-29%, grade 1 diastolic dysfunction, mild MR, trivial pericardial effusion  . History of nuclear stress test    Myoview 2/17: no ischemia or scar, EF 79%; low risk  . Hyperlipidemia   . Hypertension   . Insomnia 05/16/2016  . Non-small cell carcinoma of right lung, stage 2 ([place]) 10/03/2015  . Radiation 02/29/16-04/10/16   50.4 Gy to right central chest  . Renal cell carcinoma ([place])    L nephrectomy  in 2012   Family History  Problem Relation Age of Onset  . Heart disease Sister   . Obesity Brother   . Heart attack Daughter 82    s/p CABG  . Glaucoma Daughter   . Breast cancer Sister   . Birth defects Sister   . Cancer Mother     Bladder Cancer  . Hypertension Mother   . CAD Mother 68  . Cancer Maternal Grandmother    Past Surgical History:  Procedure Laterality Date  . BACK SURGERY     cervical 1991  . EYE SURGERY    . kidney cancer    . NEPHRECTOMY    . SPINE SURGERY    . TUBAL LIGATION    . VIDEO ASSISTED THORACOSCOPY (VATS)/WEDGE RESECTION Right 01/18/2016   Procedure: VIDEO ASSISTED THORACOSCOPY (VATS)/LUNG RESECTION, THOROCOTOMY, RIGHT  UPPER LOBECTOMY, LYMPH NODE DISSECTION, PLACEMENT OF ON Q;  Surgeon: [name], MD;  Location: [facility];  Service: Thoracic;  Laterality: Right;  Marland Kitchen VIDEO BRONCHOSCOPY Bilateral 09/20/2015   Procedure: VIDEO BRONCHOSCOPY WITHOUT FLUORO;  Surgeon: Rigoberto Noel, MD;  [facility];  Service: Cardiopulmonary;  Laterality: Bilateral;  . VIDEO BRONCHOSCOPY N/A 01/18/2016   Procedure: VIDEO BRONCHOSCOPY;  Surgeon: [name], MD;  Location: [facility] OR;  Service: Thoracic;  Laterality: N/A;   Social History   Occupational History  . Not on file.   Social History Main Topics  . Smoking status: Former Smoker    Packs/day: 0.50    Years: 50.00    Types: Cigarettes    Quit date: 03/16/2011  .  Smokeless tobacco: Never Used  . Alcohol use No  . Drug use: No  . Sexual activity: Not Currently

## 2016-12-17 ENCOUNTER — Encounter (INDEPENDENT_AMBULATORY_CARE_PROVIDER_SITE_OTHER): Payer: Self-pay | Admitting: Physical Medicine and Rehabilitation

## 2016-12-20 ENCOUNTER — Ambulatory Visit: Payer: Medicare Other | Attending: Physical Medicine and Rehabilitation | Admitting: Physical Therapy

## 2016-12-20 DIAGNOSIS — M6281 Muscle weakness (generalized): Secondary | ICD-10-CM | POA: Diagnosis not present

## 2016-12-20 DIAGNOSIS — M5442 Lumbago with sciatica, left side: Secondary | ICD-10-CM | POA: Insufficient documentation

## 2016-12-20 DIAGNOSIS — R262 Difficulty in walking, not elsewhere classified: Secondary | ICD-10-CM | POA: Insufficient documentation

## 2016-12-20 NOTE — Patient Instructions (Addendum)
(  Clinic) Flexion: Pelvic Tilt    Lie with neck supported, knees bent, feet flat. Tighten and suck stomach in, pushing back down against surface. Do not push down with legs. Repeat ____ times per set. Do ____ sets per session. Do ____ sessions per week.  Copyright  VHI. All rights reserved.   Knee Roll    Lying on back, with knees bent and feet flat on floor, arms outstretched to sides, slowly roll both knees to side, hold 5 seconds. Back to starting position, hold 5 seconds. Then to opposite side, hold 5 seconds. Return to starting position. Keep shoulders and arms in contact with floor.   Copyright  VHI. All rights reserved.    Supine Marching:  10 reps 1-2 sets Twice a day Holding 1-2 seconds

## 2016-12-21 ENCOUNTER — Ambulatory Visit (HOSPITAL_COMMUNITY): Admission: RE | Admit: 2016-12-21 | Payer: Medicare Other | Source: Ambulatory Visit

## 2016-12-21 ENCOUNTER — Encounter: Payer: Self-pay | Admitting: Physical Therapy

## 2016-12-21 ENCOUNTER — Ambulatory Visit (HOSPITAL_COMMUNITY)
Admission: RE | Admit: 2016-12-21 | Discharge: 2016-12-21 | Disposition: A | Discharge status: Home / Self Care | Payer: Medicare Other | Source: Ambulatory Visit | Attending: Physical Medicine and Rehabilitation | Admitting: Physical Medicine and Rehabilitation

## 2016-12-21 DIAGNOSIS — M4854XS Collapsed vertebra, not elsewhere classified, thoracic region, sequela of fracture: Secondary | ICD-10-CM | POA: Diagnosis not present

## 2016-12-21 DIAGNOSIS — M899 Disorder of bone, unspecified: Secondary | ICD-10-CM | POA: Diagnosis not present

## 2016-12-21 DIAGNOSIS — M546 Pain in thoracic spine: Secondary | ICD-10-CM

## 2016-12-21 DIAGNOSIS — S22040A Wedge compression fracture of fourth thoracic vertebra, initial encounter for closed fracture: Secondary | ICD-10-CM | POA: Diagnosis not present

## 2016-12-21 DIAGNOSIS — S22050A Wedge compression fracture of T5-T6 vertebra, initial encounter for closed fracture: Secondary | ICD-10-CM | POA: Diagnosis not present

## 2016-12-21 LAB — POCT I-STAT CREATININE: Creatinine, Ser: 1.3 mg/dL — ABNORMAL HIGH (ref 0.44–1.00)

## 2016-12-21 MED ORDER — GADOBENATE DIMEGLUMINE 529 MG/ML IV SOLN
10.0000 mL | Freq: Once | INTRAVENOUS | Status: AC | PRN
  Administered 2016-12-21: 8 mL via INTRAVENOUS

## 2016-12-21 NOTE — Therapy (Signed)
[place], [place], [address] Phone: [phone]   Fax:  [phone]  Physical Therapy Evaluation  Patient Details  Name: Tammy Ball MRN: [id number] Date of Birth: [birth year] Referring Provider: Magnus Sinning, MD  Encounter Date: 12/20/2016      PT End of Session - 12/20/16 1459    Visit Number 1   Number of Visits 12   Date for PT Re-Evaluation 02/17/17   Authorization Type Medicare, KX modifier after visit 15   PT Start Time 1415   PT Stop Time 1455   PT Time Calculation (min) 40 min   Activity Tolerance Patient tolerated treatment well;Patient limited by pain   Behavior During Therapy [facility] for tasks assessed/performed      Past Medical History:  Diagnosis Date  . Anemia    was while doing chemo  . Anxiety   . Asthma   . Chemotherapy-induced neuropathy ([place]) 11/01/2015  . Claustrophobia   . COPD (chronic obstructive pulmonary disease) ([place])   . Depression   . Encounter for antineoplastic chemotherapy 02/09/2016  . Glaucoma   . Headache    prior to menopause  . Heart murmur   . History of echocardiogram    Echo 2/17: EF 37-10%, grade 1 diastolic dysfunction, mild MR, trivial pericardial effusion  . History of nuclear stress test    Myoview 2/17: no ischemia or scar, EF 79%; low risk  . Hyperlipidemia   . Hypertension   . Insomnia 05/16/2016  . Non-small cell carcinoma of right lung, stage 2 ([place]) 10/03/2015  . Radiation 02/29/16-04/10/16   50.4 Gy to right central chest  . Renal cell carcinoma ([place])    L nephrectomy  in 2012    Past Surgical History:  Procedure Laterality Date  . BACK SURGERY     cervical 1991  . EYE SURGERY    . kidney cancer    . NEPHRECTOMY    . SPINE SURGERY    . TUBAL LIGATION    . VIDEO ASSISTED THORACOSCOPY (VATS)/WEDGE RESECTION Right 01/18/2016   Procedure: VIDEO ASSISTED THORACOSCOPY (VATS)/LUNG RESECTION, THOROCOTOMY, RIGHT UPPER LOBECTOMY,  LYMPH NODE DISSECTION, PLACEMENT OF ON Q;  Surgeon: [name], MD;  Location: [place];  Service: Thoracic;  Laterality: Right;  Marland Kitchen VIDEO BRONCHOSCOPY Bilateral 09/20/2015   Procedure: VIDEO BRONCHOSCOPY WITHOUT FLUORO;  Surgeon: Rigoberto Noel, MD;  [facility];  Service: Cardiopulmonary;  Laterality: Bilateral;  . VIDEO BRONCHOSCOPY N/A 01/18/2016   Procedure: VIDEO BRONCHOSCOPY;  Surgeon: [name], MD;  Location: [facility];  Service: Thoracic;  Laterality: N/A;    There were no vitals filed for this visit.       Subjective Assessment - 12/20/16 1425    Subjective Pt arriving to therapy reporting 8/10 mid back pain. pt reporting not taking pain meds due to having to drive to therapy. Pt reported her pain began in August where she ended up in the ER.    Pertinent History Compression fx to T3   Limitations Sitting;Lifting;Standing;Walking   How long can you sit comfortably? 5 minutes   How long can you stand comfortably? 10 minutes   How long can you walk comfortably? 5 minutes   Diagnostic tests pt scheduled for MRI 12/21/16   Patient Stated Goals Stop hurting   Currently in Pain? Yes   Pain Score 8    Pain Location Thoracic   Pain Descriptors / Indicators Aching;Stabbing   Pain Type Acute  pain   Pain Onset More than a month ago   Pain Frequency Constant   Aggravating Factors  bending, walking standing, prolonged sitting   Pain Relieving Factors heat,  pain meds   Multiple Pain Sites No                               PT Education - 12/20/16 1458    Education provided Yes   Education Details HEP   Person(s) Educated Patient   Methods Explanation;Demonstration;Tactile cues;Handout;Verbal cues   Comprehension Verbalized understanding;Returned demonstration;Verbal cues required          PT Short Term Goals - 12/20/16 1430      PT SHORT TERM GOAL #1   Title Pt will be independent with her HEP.    Time 3   Period Weeks   Status New            PT Long Term Goals - 01/02/17 1610      PT LONG TERM GOAL #1   Title Pt will improve bilateral hip strength to >/= 4/5 in order to improve functional mobility and gait.    Time 6   Period Weeks   Status New     PT LONG TERM GOAL #2   Title Pt will report pain of </= 3/10 with ADL's at home.    Time 6   Period Weeks   Status New     PT LONG TERM GOAL #3   Title Pt will be able to perform supine to sit independently.   Baseline min assistance on 12/20/16   Time 6   Period Weeks   Status New     PT LONG TERM GOAL #4   Title Pt will be able to perform sit to stand without UE support with no pain.    Time 6   Period Weeks   Status New               Plan - 12/20/16 1500    Clinical Impression Statement Pt arriving to therapy reporting 8/10 mid thoracic pain. Pt reporting pain has worsened since August. pt with decreaesd hip strength, limited trunk mobility and decreased functional mobility. Skilled PT needed to address pt's impairments.    Rehab Potential Good   PT Frequency 2x / week   PT Duration 6 weeks   PT Treatment/Interventions ADLs/Self Care Home Management;Cryotherapy;Moist Heat;Therapeutic exercise;Therapeutic activities;Functional mobility training;Stair training;Gait training;Patient/family education;Dry needling;Manual techniques   PT Next Visit Plan FOTO, TUG, lumbar/thoracic ROM and strengthening   PT Home Exercise Plan PPT, supine marching, trunk rotation   Consulted and Agree with Plan of Care Patient      Patient will benefit from skilled therapeutic intervention in order to improve the following deficits and impairments:     Visit Diagnosis: Acute left-sided low back pain with left-sided sciatica  Muscle weakness (generalized)  Difficulty in walking, not elsewhere classified      G-Codes - 01/02/17 02-03-27    Functional Assessment Tool Used clinical assessment   Functional Limitation Mobility: Walking and moving around   Mobility:  Walking and Moving Around Current Status [phone]) At least 20 percent but less than 40 percent impaired, limited or restricted   Mobility: Walking and Moving Around Goal Status [phone]) At least 1 percent but less than 20 percent impaired, limited or restricted       Problem List Patient Active Problem List   Diagnosis Date Noted  .  History of compression fracture of spine 12/10/2016  . Hypercalcemia 06/25/2016  . Insomnia 05/16/2016  . Pneumonitis, radiation ([place]) 04/11/2016  . S/P lobectomy of lung 01/18/2016  . Chemotherapy-induced neuropathy ([place]) 11/01/2015  . Non-small cell carcinoma of right lung, stage 2 ([place]) 10/03/2015  . Morbid obesity ([place]) 10/01/2015  . Hyperlipidemia 08/14/2013  . Spondylolisthesis of lumbar region 07/03/2013  . Glaucoma 10/23/2012  . Essential hypertension 12/17/2011  . COPD GOLD II  12/17/2011  . Hearing loss 12/17/2011  . Renal cell cancer ([place]) 12/17/2011  . Sciatica of left side 12/17/2011  . Depression 12/17/2011    Oretha Caprice, MPT 12/21/2016, 12:29 PM  [place] [address] [place], [place], [address] Phone: [phone]   Fax:  [phone]  Name: Tammy Ball MRN: [id number] Date of Birth: [birth year]

## 2016-12-24 NOTE — Progress Notes (Signed)
See results,pt has appointment tomorrow. Will need to follow with Onc.

## 2016-12-25 ENCOUNTER — Encounter: Payer: Self-pay | Admitting: Radiation Oncology

## 2016-12-25 ENCOUNTER — Encounter (INDEPENDENT_AMBULATORY_CARE_PROVIDER_SITE_OTHER): Payer: Self-pay | Admitting: Physical Medicine and Rehabilitation

## 2016-12-25 ENCOUNTER — Encounter: Payer: Medicare Other | Admitting: Physical Therapy

## 2016-12-25 ENCOUNTER — Ambulatory Visit (INDEPENDENT_AMBULATORY_CARE_PROVIDER_SITE_OTHER): Payer: Medicare Other | Admitting: Physical Medicine and Rehabilitation

## 2016-12-25 VITALS — BP 171/99 | HR 82

## 2016-12-25 DIAGNOSIS — G8929 Other chronic pain: Secondary | ICD-10-CM

## 2016-12-25 DIAGNOSIS — M546 Pain in thoracic spine: Secondary | ICD-10-CM

## 2016-12-25 DIAGNOSIS — Z8781 Personal history of (healed) traumatic fracture: Secondary | ICD-10-CM

## 2016-12-25 NOTE — Progress Notes (Signed)
Tammy Ball - 76 y.o. female MRN [id number]  Date of birth: 06-Jun-1941  Office Visit Note: Visit Date: 12/25/2016 PCP: Reginia Forts, MD Referred by: Wardell Honour, MD  Subjective: Chief Complaint  Patient presents with  . Middle Back - Pain   HPI: Tammy Ball is a 76 year old female with known lung cancer status post resection and radiation treatment who I saw a few weeks ago for worsening thoracic pain with known T3 compression fracture. Just as I noted in my last note and we did review a CT angiogram in August of last year and CT chest in November of last year and a T3 compression fracture was new on the chest CT in November. She went on to have worsening pain and we were seeing her through her primary care physician. She had not had specific physical therapy but had tried some medications without relief. She continues to work. She had not had an MRI of the thoracic spine. None of the imaging studies reports felt like this was metastatic disease. We did send her for an MRI of the thoracic spine to see if she might be a candidate for kyphoplasty. I actually order the MRI without contrast knowing the multiple imaging studies have been looked at in terms of metastatic disease and she does have some chronic renal insufficiency. The radiologist did do the images with contrast and that report is reviewed below and reviewed with the patient today and I did look at the pictures with her. There has actually been a new compression fracture at T4 which is more of a 50% wedge compression fracture compared to the 20% compression fracture of the superior endplate of T3. There is edema noted on the fat saturation views at T3 and T4. There is also postcontrast images showing what is likely consistent with metastatic disease in the pedicle below at the left T4 and T5. Interestingly,  she had a plain film x-ray of the thoracic spine on 12/09/2016 that showed worsening of the T3 compression fracture by report. When  I review that now and look at it and look at the new MRI, it appears to me that the L4 compression fracture may have been present at that time. She has gone to one therapy session for evaluation. She states that overall her pain is a little bit better but still present still ongoing. She feels like it has spread a little bit more and is now bilateral more than one side. It is still upper mid back consistent with a T3 and T4 area. There does appear to be some more kyphosis on the imaging at that level of the T4 compression fracture. There is no retropulsion. She's having no neurologic complaints no tingling or weakness in the legs. She does report over the weekend a mild fever at 100.5 without any other symptoms. She's not had a cough for any other upper respiratory symptoms.     Review of Systems  Constitutional: Positive for fever. Negative for chills, malaise/fatigue and weight loss.       She reported 100.5 over the weekend  HENT: Negative for hearing loss and sinus pain.   Eyes: Negative for blurred vision, double vision and photophobia.  Respiratory: Negative for cough and shortness of breath.   Cardiovascular: Negative for chest pain, palpitations and leg swelling.  Gastrointestinal: Negative for abdominal pain, nausea and vomiting.  Genitourinary: Negative for flank pain.  Musculoskeletal: Positive for back pain. Negative for myalgias.  Skin: Negative for itching and rash.  Neurological: Negative for tremors, focal weakness and weakness.  Endo/Heme/Allergies: Negative.   Psychiatric/Behavioral: Negative for depression.  All other systems reviewed and are negative.  Otherwise per HPI.  Assessment & Plan: Visit Diagnoses:  1. History of compression fracture of spine   2. Chronic midline thoracic back pain     Plan: Findings:  Chronic worsening severe at times upper thoracic pain really in the upper mid scapular region with referral pain laterally and bilaterally. This is in the  setting of prior lung cancer history with resection and radiation treatment. MRI findings of T3 and T4 compression fracture with areas consistent with possible metastasis in the pedicles below L4. I have reviewed all the images over the last year of the thoracic spine and I feel like at least the the T4 compression fracture may have been on the last x-ray obtained in the end of January. The clarity of that image was hard to see to try to count from her prior cervical ACDF. Nonetheless there are 2 compression fractures one at L3 which is 20% and one  at L4 which is 50% of that I think are causing a lot of her symptoms. We are going to have her follow up with her radiation oncologist Dr. Sondra Come. Hopefully she still a candidate for something like a kyphoplasty or vertebroplasty by interventional radiology in terms of pain relief. I spent more than 35 minutes speaking face-to-face with the patient with 50% of the time in counseling.    Meds & Orders: No orders of the defined types were placed in this encounter.  No orders of the defined types were placed in this encounter.   Follow-up: Patient is going to call her radiation oncologist Dr. Sondra Come to review the MRI and establish plan.  Procedures: No procedures performed  No notes on file   Clinical History: MRI THORACIC SPINE WITH CONTRAST 12/21/2016  TECHNIQUE: Multiplanar, multisequence MR imaging of the thoracic spine was performed following the administration of intravenous contrast.  COMPARISON:  Thoracic CT 10/25/2016  Thoracic spine radiograph 12/08/2016  FINDINGS: Alignment:  Normal  Vertebrae: There are compression fractures at T3 and T4. There is mild edema of both T3 endplates. There is an area of edema within the left posterior aspect of the T4 body. No other compression fracture.  At the T4 and T5 levels, there are areas of low T1 weighted signal at the junction of the vertebral body and left pedicle that show contrast  enhancement after gadolinium administration. There is mild contrast enhancement of the superior endplate of T3. No focal signal abnormality or contrast enhancement of the thoracic vertebrae below the T5 level.  Cord: The spinal cord has normal signal and caliber. There is no focal epidural abnormality.  Paraspinal and other soft tissues: Known pulmonary abnormalities are poorly characterized on this study. Visualized paraspinal soft tissues are otherwise unremarkable.  Disc levels:  There is no thoracic spinal canal or neural foraminal stenosis.  IMPRESSION: 1. Focal osseous lesions at the junction of the vertebral body and left pedicle at the T4 and T5 levels. The signal characteristics, enhancement pattern and location are all suggestive of osseous metastatic disease. 2. Compression fractures at T3 and T4 with approximately 25% and 50% height loss, respectively. No significant retropulsion. 3. Contrast enhancement of the T3 superior endplate may be secondary to recent fracture or worsening of fracture and is not specific for metastatic disease. Attention recommended on follow-up.  She reports that she quit smoking about 5 years ago. Her smoking  use included Cigarettes. She has a 25.00 pack-year smoking history. She has never used smokeless tobacco. No results for input(s): HGBA1C, LABURIC in the last 8760 hours.  Objective:  VS:  HT:    WT:   BMI:     BP:(!) 171/99  HR:82bpm  TEMP: ( )  RESP:  Physical Exam  Constitutional: She is oriented to person, place, and time. She appears well-developed and well-nourished. No distress.  HENT:  Head: Normocephalic and atraumatic.  Nose: Nose normal.  Mouth/Throat: Oropharynx is clear and moist.  Eyes: Conjunctivae are normal. Pupils are equal, round, and reactive to light.  Neck: Normal range of motion. Neck supple.  Cardiovascular: Regular rhythm and intact distal pulses.   Pulmonary/Chest: Effort normal and breath sounds  normal.  Abdominal: She exhibits no distension.  Musculoskeletal:  Examination of the cervical and thoracic spine shows some increased kyphosis with commensurate lordosis of the cervical spine. She has decreased range of motion of the cervical spine with rotation. She has pain to palpation along the sinus processes of the mid back. She does have some myofascial pain and trigger points along the rhomboids. She has good distal strength without clonus.  Neurological: She is alert and oriented to person, place, and time. She exhibits abnormal muscle tone. Coordination normal.  Skin: Skin is warm. No rash noted. No erythema.  Psychiatric: She has a normal mood and affect. Her behavior is normal.  Nursing note and vitals reviewed.   Ortho Exam Imaging: No results found.  Past Medical/Family/Surgical/Social History: Medications & Allergies reviewed per EMR Patient Active Problem List   Diagnosis Date Noted  . History of compression fracture of spine 12/10/2016  . Hypercalcemia 06/25/2016  . Insomnia 05/16/2016  . Pneumonitis, radiation ([place]) 04/11/2016  . S/P lobectomy of lung 01/18/2016  . Chemotherapy-induced neuropathy ([place]) 11/01/2015  . Non-small cell carcinoma of right lung, stage 2 ([place]) 10/03/2015  . Morbid obesity ([place]) 10/01/2015  . Hyperlipidemia 08/14/2013  . Spondylolisthesis of lumbar region 07/03/2013  . Glaucoma 10/23/2012  . Essential hypertension 12/17/2011  . COPD GOLD II  12/17/2011  . Hearing loss 12/17/2011  . Renal cell cancer ([place]) 12/17/2011  . Sciatica of left side 12/17/2011  . Depression 12/17/2011   Past Medical History:  Diagnosis Date  . Anemia    was while doing chemo  . Anxiety   . Asthma   . Chemotherapy-induced neuropathy ([place]) 11/01/2015  . Claustrophobia   . COPD (chronic obstructive pulmonary disease) ([place])   . Depression   . Encounter for antineoplastic chemotherapy 02/09/2016  . Glaucoma   . Headache    prior to menopause  . Heart  murmur   . History of echocardiogram    Echo 2/17: EF 16-10%, grade 1 diastolic dysfunction, mild MR, trivial pericardial effusion  . History of nuclear stress test    Myoview 2/17: no ischemia or scar, EF 79%; low risk  . Hyperlipidemia   . Hypertension   . Insomnia 05/16/2016  . Non-small cell carcinoma of right lung, stage 2 ([place]) 10/03/2015  . Radiation 02/29/16-04/10/16   50.4 Gy to right central chest  . Renal cell carcinoma ([place])    L nephrectomy  in 2012   Family History  Problem Relation Age of Onset  . Heart disease Sister   . Obesity Brother   . Heart attack Daughter 47    s/p CABG  . Glaucoma Daughter   . Breast cancer Sister   . Birth defects Sister   . Cancer Mother  Bladder Cancer  . Hypertension Mother   . CAD Mother 85  . Cancer Maternal Grandmother    Past Surgical History:  Procedure Laterality Date  . BACK SURGERY     cervical 1991  . EYE SURGERY    . kidney cancer    . NEPHRECTOMY    . SPINE SURGERY    . TUBAL LIGATION    . VIDEO ASSISTED THORACOSCOPY (VATS)/WEDGE RESECTION Right 01/18/2016   Procedure: VIDEO ASSISTED THORACOSCOPY (VATS)/LUNG RESECTION, THOROCOTOMY, RIGHT UPPER LOBECTOMY, LYMPH NODE DISSECTION, PLACEMENT OF ON Q;  Surgeon: [name], MD;  Location: [facility];  Service: Thoracic;  Laterality: Right;  Marland Kitchen VIDEO BRONCHOSCOPY Bilateral 09/20/2015   Procedure: VIDEO BRONCHOSCOPY WITHOUT FLUORO;  Surgeon: Rigoberto Noel, MD;  [facility];  Service: Cardiopulmonary;  Laterality: Bilateral;  . VIDEO BRONCHOSCOPY N/A 01/18/2016   Procedure: VIDEO BRONCHOSCOPY;  Surgeon: [name], MD;  Location: [facility] OR;  Service: Thoracic;  Laterality: N/A;   Social History   Occupational History  . Not on file.   Social History Main Topics  . Smoking status: Former Smoker    Packs/day: 0.50    Years: 50.00    Types: Cigarettes    Quit date: 03/16/2011  . Smokeless tobacco: Never Used  . Alcohol use No  . Drug use: No  . Sexual  activity: Not Currently

## 2016-12-25 NOTE — Progress Notes (Signed)
Histology and Location of Primary Cancer: Stage IIIA(ypT2a, pN2, M0) non-small cell carcinoma   Location(s) of Symptomatic Metastases: T3 and T4  Past/Anticipated chemotherapy by medical oncology, if any: no  Pain on a scale of 0-10 is: 10 in her middle back.  She is taking tramadol three times a day.  If Spine Met(s), symptoms, if any, include:  Bowel/Bladder retention or incontinence (please describe): reports having some constipation.  She is taking a stool softener.  Numbness or weakness in extremities (please describe): no  Current Decadron regimen, if applicable: no  Ambulatory status? Walker? Wheelchair?: ambulatory  SAFETY ISSUES: Prior radiation? 02/29/16-04/10/16 50.4 Gy to right central chest, 10/30/16-11/06/16 Left lower lung/ 54 Gy in 3 fractions  Pacemaker/ICD? no  Possible current pregnancy? no  Is the patient on methotrexate? no  Current Complaints / other details:    BP (!) 158/99 (BP Location: Left Arm, Patient Position: Sitting)   Pulse 81   Temp 98.6 F (37 C) (Oral)   Ht 5' 3.75" (1.619 m)   Wt 168 lb 6.4 oz (76.4 kg)   SpO2 97%   BMI 29.13 kg/m    Wt Readings from Last 3 Encounters:  12/26/16 168 lb 6.4 oz (76.4 kg)  12/08/16 170 lb (77.1 kg)  11/14/16 171 lb (77.6 kg)     

## 2016-12-26 ENCOUNTER — Ambulatory Visit: Payer: Medicare Other

## 2016-12-26 ENCOUNTER — Ambulatory Visit
Admission: RE | Admit: 2016-12-26 | Discharge: 2016-12-26 | Disposition: A | Discharge status: Home / Self Care | Payer: Medicare Other | Source: Ambulatory Visit | Attending: Radiation Oncology | Admitting: Radiation Oncology

## 2016-12-26 ENCOUNTER — Encounter: Payer: Self-pay | Admitting: Radiation Oncology

## 2016-12-26 VITALS — BP 158/99 | HR 81 | Temp 98.6°F | Ht 63.75 in | Wt 168.4 lb

## 2016-12-26 DIAGNOSIS — R911 Solitary pulmonary nodule: Secondary | ICD-10-CM | POA: Diagnosis not present

## 2016-12-26 DIAGNOSIS — M4854XA Collapsed vertebra, not elsewhere classified, thoracic region, initial encounter for fracture: Secondary | ICD-10-CM | POA: Diagnosis not present

## 2016-12-26 DIAGNOSIS — C3431 Malignant neoplasm of lower lobe, right bronchus or lung: Secondary | ICD-10-CM | POA: Insufficient documentation

## 2016-12-26 DIAGNOSIS — M549 Dorsalgia, unspecified: Secondary | ICD-10-CM

## 2016-12-26 DIAGNOSIS — C7951 Secondary malignant neoplasm of bone: Secondary | ICD-10-CM | POA: Diagnosis not present

## 2016-12-26 DIAGNOSIS — C3491 Malignant neoplasm of unspecified part of right bronchus or lung: Secondary | ICD-10-CM

## 2016-12-26 DIAGNOSIS — Z7982 Long term (current) use of aspirin: Secondary | ICD-10-CM | POA: Insufficient documentation

## 2016-12-26 DIAGNOSIS — M546 Pain in thoracic spine: Secondary | ICD-10-CM | POA: Insufficient documentation

## 2016-12-26 DIAGNOSIS — G893 Neoplasm related pain (acute) (chronic): Secondary | ICD-10-CM | POA: Diagnosis not present

## 2016-12-26 DIAGNOSIS — Z923 Personal history of irradiation: Secondary | ICD-10-CM | POA: Diagnosis not present

## 2016-12-26 HISTORY — DX: Personal history of irradiation: Z92.3

## 2016-12-26 NOTE — Progress Notes (Signed)
Radiation Oncology         [phone] ________________________________  Name: Tammy Ball MRN: [id number]  Date: 12/26/2016  DOB: May 24, 1941  Re-Consultation Visit Note  CC: Reginia Forts, MD  Curt Bears, MD   Diagnosis:  Stage IIIA (ypT2a, pN2, M0) non-small cell carcinoma (squamous cell carcinoma) of the right lower lung, with a solitary left lower lobe lung nodule ([place] metastasis), and now with probable symptomatic metastases at T3-T5 Spine  Interval Since Last Radiation: 6 weeks 10/30/16 - 11/06/16 : Left Lower Lung treated to 54 Gy in 3 fractions with SBRT 02/29/2016-04/10/2016: Right Central Chest treated to 50.4 Gy in 28 fractions  Narrative:  The patient returns today for a re-consultation. This is a 76 year old female seen at the courtesy of Dr. Magnus Sinning. She has a prior history of non-small cell lung cancer stage IIIA and received radiation to the Right Central Chest (50.4 Gy). The patient also recently completed SBRT for an oligometastatic Left Lower Lobe nodule.  The pt now presents with an approximately 2 month history of upper back pain which is progressive. The patient would not be able to sleep without narcotic pain medications. Patient was seen by Dr. Ernestina Patches and an MRI was ordered. MRI on 12/21/16 showed compression fractures at T3 and T4. There are also focal osseous lesions at the junction of the vertebral body and left pedicle at the T4 and T5 levels, suggestive for osseous disease.  On review of systems, the patient reports pain 10/10 in severity in the middle of her back. She is taking Tramadol 3 x daily to manage this pain. She is ambulatory without assistance. She denies numbness or weakness in extremities. She reports some constipation and is taking a stool softener. She denies any bladder concerns at this time. The patient is not taking Decadron. No reports of fecal or urinary incontinence.  ALLERGIES:  is allergic to bee venom; amlodipine;  levofloxacin; and hctz [hydrochlorothiazide].  Meds: Current Outpatient Prescriptions  Medication Sig Dispense Refill  . acetaminophen (TYLENOL) 500 MG tablet Take 500 mg by mouth every 6 (six) hours as needed for mild pain, moderate pain, fever or headache. Reported on 05/24/2016    . albuterol (PROVENTIL HFA;VENTOLIN HFA) 108 (90 BASE) MCG/ACT inhaler Inhale 2 puffs into the lungs every 4 (four) hours as needed for wheezing or shortness of breath. 1 Inhaler 0  . aspirin EC 81 MG tablet Take 81 mg by mouth daily. Reported on 04/11/2016    . atorvastatin (LIPITOR) 40 MG tablet TAKE ONE TABLET BY MOUTH ONCE DAILY. 90 tablet 3  . Cholecalciferol (VITAMIN D3 PO) Take 1 tablet by mouth daily.    . fish oil-omega-3 fatty acids 1000 MG capsule Take 1 capsule by mouth daily.     . Fluticasone-Salmeterol (ADVAIR) 250-50 MCG/DOSE AEPB Inhale 1 puff into the lungs 2 (two) times daily. 60 each 11  . metoprolol succinate (TOPROL-XL) 50 MG 24 hr tablet Take 1 tablet (50 mg total) by mouth daily. Take with or immediately following a meal. 30 tablet 5  . polyvinyl alcohol-povidone (REFRESH) 1.4-0.6 % ophthalmic solution Place 2 drops into both eyes daily as needed (for dry eyes). Reported on 05/24/2016    . temazepam (RESTORIL) 15 MG capsule Take 1 capsule (15 mg total) by mouth at bedtime as needed for sleep. 30 capsule 0  . tizanidine (ZANAFLEX) 2 MG capsule Take 1 capsule (2 mg total) by mouth 3 (three) times daily. 60 capsule 0  . traMADol (ULTRAM) 50  MG tablet Take 1-2 tablets (50-100 mg total) by mouth every 6 (six) hours as needed for moderate pain. Reported on 05/24/2016 120 tablet 02  . albuterol (PROVENTIL) (2.5 MG/3ML) 0.083% nebulizer solution Take 2.5 mg by nebulization every 6 (six) hours as needed for wheezing or shortness of breath.    . cefdinir (OMNICEF) 300 MG capsule Take 2 capsules (600 mg total) by mouth daily. (Patient not taking: Reported on 12/25/2016) 20 capsule 0  . fluticasone (FLONASE) 50  MCG/ACT nasal spray Place 2 sprays into both nostrils daily as needed for allergies.     Marland Kitchen LORazepam (ATIVAN) 0.5 MG tablet Take 1 to 2 by mouth, 1-2 hours preprocedure. May repeat if necessary. (Patient not taking: Reported on 12/26/2016) 5 tablet 0   Current Facility-Administered Medications  Medication Dose Route Frequency Provider Last Rate Last Dose  . levalbuterol (XOPENEX) nebulizer solution 0.63 mg  0.63 mg Nebulization Once [name], PA-C        Physical Findings: The patient is in no acute distress. Patient is alert and oriented.  height is 5' 3.75" (1.619 m) and weight is 168 lb 6.4 oz (76.4 kg). Her oral temperature is 98.6 F (37 C). Her blood pressure is 158/99 (abnormal) and her pulse is 81. Her oxygen saturation is 97%.   General: Alert and oriented, in no acute distress. HEENT: Head is normocephalic. Extraocular movements are intact. Oropharynx is clear. Neck: Neck is supple, no palpable cervical or supraclavicular lymphadenopathy. Heart: Regular in rate and rhythm with no murmurs, rubs, or gallops. Chest: Clear to auscultation bilaterally, with no rhonchi, wheezes, or rales. Abdomen: Soft, nontender, nondistended, with no rigidity or guarding. Extremities: No edema. Lymphatics: see Neck Exam Skin: No concerning lesions. Musculoskeletal: symmetric strength and muscle tone throughout. Palpation along thoracic spine reveals point tenderness at approximately the T3-T5 area. Neurologic: No obvious focalities. Speech is fluent. Coordination is intact. 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 Psychiatric: Judgment and insight are intact. Affect is appropriate.   Lab Findings: Lab Results  Component Value Date   WBC 10.8 10/30/2016   HGB 12.3 10/25/2016   HCT 39.6 10/30/2016   MCV 90 10/30/2016   PLT 406 (H) 10/30/2016    Radiographic Findings: Dg Ribs Unilateral W/chest Right  Result Date: 12/08/2016 CLINICAL DATA:  Thoracic back pain EXAM:  RIGHT RIBS AND CHEST - 3+ VIEW COMPARISON:  11/14/2006 FINDINGS: No definite displaced rib fracture or focal rib abnormality. No chest wall subcutaneous emphysema or focal swelling. Mild hyperinflation without focal pneumonia, collapse or consolidation. Negative for edema, effusion or pneumothorax. Stable heart size and vascularity. Atherosclerosis noted of the aorta. Lower cervical fusion hardware evident. Normal bowel gas pattern. IMPRESSION: No acute finding by plain radiography.  Stable exam. Electronically Signed   By: Jerilynn Mages.  Shick M.D.   On: 12/08/2016 11:02   Dg Thoracic Spine 2 View  Result Date: 12/08/2016 CLINICAL DATA:  History of compression fracture. Right posterior rib pain. EXAM: THORACIC SPINE 2 VIEWS COMPARISON:  11/14/2016 FINDINGS: Moderate T3 compression fracture again noted, slightly progressed. No new compression fracture. Degenerative spurring throughout the thoracic spine. No malalignment. IMPRESSION: Chronic T3 compression fracture, slightly progressed since prior study. No acute fracture. Electronically Signed   By: Rolm Baptise M.D.   On: 12/08/2016 11:01   Mr Thoracic Spine W Wo Contrast  Result Date: 12/21/2016 CLINICAL DATA:  Compression fracture.  Evaluation for metastasis. EXAM: MRI THORACIC SPINE WITH CONTRAST TECHNIQUE: Multiplanar, multisequence MR imaging of the thoracic spine  was performed following the administration of intravenous contrast. COMPARISON:  Thoracic CT 10/25/2016 Thoracic spine radiograph 12/08/2016 FINDINGS: Alignment:  Normal Vertebrae: There are compression fractures at T3 and T4. There is mild edema of both T3 endplates. There is an area of edema within the left posterior aspect of the T4 body. No other compression fracture. At the T4 and T5 levels, there are areas of low T1 weighted signal at the junction of the vertebral body and left pedicle that show contrast enhancement after gadolinium administration. There is mild contrast enhancement of the superior  endplate of T3. No focal signal abnormality or contrast enhancement of the thoracic vertebrae below the T5 level. Cord: The spinal cord has normal signal and caliber. There is no focal epidural abnormality. Paraspinal and other soft tissues: Known pulmonary abnormalities are poorly characterized on this study. Visualized paraspinal soft tissues are otherwise unremarkable. Disc levels: There is no thoracic spinal canal or neural foraminal stenosis. IMPRESSION: 1. Focal osseous lesions at the junction of the vertebral body and left pedicle at the T4 and T5 levels. The signal characteristics, enhancement pattern and location are all suggestive of osseous metastatic disease. 2. Compression fractures at T3 and T4 with approximately 25% and 50% height loss, respectively. No significant retropulsion. 3. Contrast enhancement of the T3 superior endplate may be secondary to recent fracture or worsening of fracture and is not specific for metastatic disease. Attention recommended on follow-up. Electronically Signed   By: Ulyses Jarred M.D.   On: 12/21/2016 14:40    Impression: Stage IIIA (ypT2a, pN2, M0) non-small cell carcinoma (squamous cell carcinoma) of the right lower lung, with a solitary left lower lobe lung nodule, and now with possible symptomatic metastases at T3 and T4-T5   I reviewed the patient's previous radiation fields and the T4 and T5 area which was previously treated with her right central chest radiation. Treatment to these areas would be somewhat problematic, but possible with advanced planning and treatment techniques. I would recommend the patient proceed with interventional radiology evaluation for consideration with vertebroplasty and at the same time biopsy to confirm for metastatic disease.  Plan: Interventional Radiation referral. Treatment plan is pending IR evaluation.    -----------------------------------  Blair Promise, PhD, MD  This document serves as a record of services  personally performed by Gery Pray, MD. It was created on his behalf by Maryla Morrow, a trained medical scribe. The creation of this record is based on the scribe's personal observations and the provider's statements to them. This document has been checked and approved by the attending provider.

## 2016-12-28 ENCOUNTER — Ambulatory Visit: Payer: Medicare Other | Admitting: Physical Therapy

## 2016-12-28 ENCOUNTER — Other Ambulatory Visit: Payer: Self-pay | Admitting: Oncology

## 2016-12-28 ENCOUNTER — Telehealth: Payer: Self-pay | Admitting: Oncology

## 2016-12-28 ENCOUNTER — Encounter: Payer: Self-pay | Admitting: Physical Therapy

## 2016-12-28 DIAGNOSIS — C3491 Malignant neoplasm of unspecified part of right bronchus or lung: Secondary | ICD-10-CM

## 2016-12-28 DIAGNOSIS — M5442 Lumbago with sciatica, left side: Secondary | ICD-10-CM

## 2016-12-28 DIAGNOSIS — R262 Difficulty in walking, not elsewhere classified: Secondary | ICD-10-CM | POA: Diagnosis not present

## 2016-12-28 DIAGNOSIS — M6281 Muscle weakness (generalized): Secondary | ICD-10-CM | POA: Diagnosis not present

## 2016-12-28 NOTE — Therapy (Addendum)
[place], [place], [address] Phone: [phone]   Fax:  [phone]  Physical Therapy Treatment  Patient Details  Name: Tammy Ball MRN: [id number] Date of Birth: [birth year] Referring Provider: Magnus Sinning, MD  Encounter Date: 12/28/2016      PT End of Session - 12/28/16 1000    Visit Number 2   Number of Visits 12   Date for PT Re-Evaluation 02/17/17   PT Start Time 0930   PT Stop Time 1015   PT Time Calculation (min) 45 min      Past Medical History:  Diagnosis Date  . Anemia    was while doing chemo  . Anxiety   . Asthma   . Chemotherapy-induced neuropathy ([place]) 11/01/2015  . Claustrophobia   . COPD (chronic obstructive pulmonary disease) ([place])   . Depression   . Encounter for antineoplastic chemotherapy 02/09/2016  . Glaucoma   . Headache    prior to menopause  . Heart murmur   . History of echocardiogram    Echo 2/17: EF 87-86%, grade 1 diastolic dysfunction, mild MR, trivial pericardial effusion  . History of nuclear stress test    Myoview 2/17: no ischemia or scar, EF 79%; low risk  . History of radiation therapy 10/30/16-11/06/16  . Hyperlipidemia   . Hypertension   . Insomnia 05/16/2016  . Non-small cell carcinoma of right lung, stage 2 ([place]) 10/03/2015  . Radiation 02/29/16-04/10/16   50.4 Gy to right central chest  . Renal cell carcinoma ([place])    L nephrectomy  in 2012    Past Surgical History:  Procedure Laterality Date  . BACK SURGERY     cervical 1991  . EYE SURGERY    . kidney cancer    . NEPHRECTOMY    . SPINE SURGERY    . TUBAL LIGATION    . VIDEO ASSISTED THORACOSCOPY (VATS)/WEDGE RESECTION Right 01/18/2016   Procedure: VIDEO ASSISTED THORACOSCOPY (VATS)/LUNG RESECTION, THOROCOTOMY, RIGHT UPPER LOBECTOMY, LYMPH NODE DISSECTION, PLACEMENT OF ON Q;  Surgeon: [name], MD;  Location: [facility];  Service: Thoracic;  Laterality: Right;  Marland Kitchen VIDEO  BRONCHOSCOPY Bilateral 09/20/2015   Procedure: VIDEO BRONCHOSCOPY WITHOUT FLUORO;  Surgeon: Rigoberto Noel, MD;  Location: [facility];  Service: Cardiopulmonary;  Laterality: Bilateral;  . VIDEO BRONCHOSCOPY N/A 01/18/2016   Procedure: VIDEO BRONCHOSCOPY;  Surgeon: [name], MD;  Location: [facility] OR;  Service: Thoracic;  Laterality: N/A;    There were no vitals filed for this visit.      Subjective Assessment - 12/28/16 0934    Subjective pt brought in MRI results T3 and T4 compression fracture ( pending surgery) Active Lung Cancer and Spinal Cancer. 10/10 d/t unable to take meds and drive   Currently in Pain? Yes   Pain Score 10-Worst pain ever   Pain Location Thoracic                         [facility] Adult PT Treatment/Exercise - 12/28/16 0001      Exercises   Exercises Lumbar;Neck     Lumbar Exercises: Aerobic   Stationary Bike Nustep L 4 3 min     Lumbar Exercises: Standing   Other Standing Lumbar Exercises 3# shruggs, back ward rolls and scap squeezes 10 reps each     Modalities   Modalities Social worker Location thoracic  Electrical Stimulation Action IFC   Electrical Stimulation Parameters as tolerated   Electrical Stimulation Goals Pain;Edema     Manual Therapy   Manual Therapy Soft tissue mobilization   Manual therapy comments traps/rhom/thor to pt tolerance                  PT Short Term Goals - 12/20/16 1430      PT SHORT TERM GOAL #1   Title Pt will be independent with her HEP.    Time 3   Period Weeks   Status New           PT Long Term Goals - 12/21/16 7619      PT LONG TERM GOAL #1   Title Pt will improve bilateral hip strength to >/= 4/5 in order to improve functional mobility and gait.    Time 6   Period Weeks   Status New     PT LONG TERM GOAL #2   Title Pt will report pain of </= 3/10 with ADL's at home.    Time 6   Period Weeks   Status New      PT LONG TERM GOAL #3   Title Pt will be able to perform supine to sit independently.   Baseline min assistance on 12/20/16   Time 6   Period Weeks   Status New     PT LONG TERM GOAL #4   Title Pt will be able to perform sit to stand without UE support with no pain.    Time 6   Period Weeks   Status New               Plan - 12/28/16 1001    Clinical Impression Statement limited tx d/t pain. tolerated STW well and very tight esp left side and RT trap, relief from estim   PT Next Visit Plan gentle strength/ROM spine. STW to tolerance. ACTIVE CANCER PENDING SPINAL SURGERY      Patient will benefit from skilled therapeutic intervention in order to improve the following deficits and impairments:  Postural dysfunction  Visit Diagnosis: Acute left-sided low back pain with left-sided sciatica     Problem List Patient Active Problem List   Diagnosis Date Noted  . History of compression fracture of spine 12/10/2016  . Hypercalcemia 06/25/2016  . Insomnia 05/16/2016  . Pneumonitis, radiation ([place]) 04/11/2016  . S/P lobectomy of lung 01/18/2016  . Chemotherapy-induced neuropathy ([place]) 11/01/2015  . Non-small cell carcinoma of right lung, stage 2 ([place]) 10/03/2015  . Morbid obesity ([place]) 10/01/2015  . Hyperlipidemia 08/14/2013  . Spondylolisthesis of lumbar region 07/03/2013  . Glaucoma 10/23/2012  . Essential hypertension 12/17/2011  . COPD GOLD II  12/17/2011  . Hearing loss 12/17/2011  . Renal cell cancer ([place]) 12/17/2011  . Sciatica of left side 12/17/2011  . Depression 12/17/2011   PHYSICAL THERAPY DISCHARGE SUMMARY  V Plan: Patient agrees to discharge.  Patient goals were not met. Patient is being discharged due to a change in medical status.  ?????      PAYSEUR,ANGIE PTA 12/28/2016, 10:03 AM  [place], [place], [address] Phone: [phone]   Fax:   [phone]  Name: Tammy Ball MRN: [id number] Date of Birth: [birth year]

## 2016-12-28 NOTE — Telephone Encounter (Signed)
Tammy Ball left a message saying she was supposed to call if she had not heard from the surgeon yesterday.  She requested a return call.

## 2016-12-28 NOTE — Telephone Encounter (Signed)
Called Maram back and advised her that Dr. Arlean Hopping office will be calling her with an appointment.  Advised her to call on Monday if she has not heard anything.  Hargun also said that she was told yesterday afternoon that Radiation Oncology closes at 4:30 and would not put her call through.  Apologized and gave her the direct number to nursing.

## 2016-12-31 ENCOUNTER — Telehealth: Payer: Self-pay | Admitting: Oncology

## 2016-12-31 NOTE — Telephone Encounter (Signed)
Tammy Ball called and said she has not heard from interventional radiology yet.  Called Anderson Malta in Interventional scheduling and left a message.  Advised Danyiel that a message had been left and that I would keep checking.

## 2017-01-01 ENCOUNTER — Other Ambulatory Visit (HOSPITAL_COMMUNITY): Payer: Self-pay | Admitting: Interventional Radiology

## 2017-01-01 ENCOUNTER — Encounter: Payer: Self-pay | Admitting: Family Medicine

## 2017-01-01 ENCOUNTER — Telehealth: Payer: Self-pay | Admitting: *Deleted

## 2017-01-01 DIAGNOSIS — C3491 Malignant neoplasm of unspecified part of right bronchus or lung: Secondary | ICD-10-CM

## 2017-01-01 DIAGNOSIS — IMO0001 Reserved for inherently not codable concepts without codable children: Secondary | ICD-10-CM

## 2017-01-01 DIAGNOSIS — M4850XA Collapsed vertebra, not elsewhere classified, site unspecified, initial encounter for fracture: Principal | ICD-10-CM

## 2017-01-01 NOTE — Telephone Encounter (Signed)
CALLED PATIENT TO INFORM  OF APPT. WITH DR. Estanislado Pandy ON 01-16-17- ARRIVAL TIME - 1:45 PM , PT. TO REPORT TO [facility], SPOKE WITH PATIENT AND SHE IS AWARE OF THIS APPT.

## 2017-01-07 DIAGNOSIS — I10 Essential (primary) hypertension: Secondary | ICD-10-CM | POA: Diagnosis not present

## 2017-01-07 DIAGNOSIS — Z6827 Body mass index (BMI) 27.0-27.9, adult: Secondary | ICD-10-CM | POA: Diagnosis not present

## 2017-01-07 DIAGNOSIS — M4854XA Collapsed vertebra, not elsewhere classified, thoracic region, initial encounter for fracture: Secondary | ICD-10-CM | POA: Diagnosis not present

## 2017-01-16 ENCOUNTER — Ambulatory Visit (HOSPITAL_COMMUNITY)
Admission: RE | Admit: 2017-01-16 | Discharge: 2017-01-16 | Disposition: A | Discharge status: Home / Self Care | Payer: Medicare Other | Source: Ambulatory Visit | Attending: Interventional Radiology | Admitting: Interventional Radiology

## 2017-01-16 DIAGNOSIS — M4850XA Collapsed vertebra, not elsewhere classified, site unspecified, initial encounter for fracture: Principal | ICD-10-CM

## 2017-01-16 DIAGNOSIS — M4856XA Collapsed vertebra, not elsewhere classified, lumbar region, initial encounter for fracture: Secondary | ICD-10-CM | POA: Diagnosis not present

## 2017-01-16 DIAGNOSIS — M545 Low back pain: Secondary | ICD-10-CM | POA: Diagnosis not present

## 2017-01-16 DIAGNOSIS — C3491 Malignant neoplasm of unspecified part of right bronchus or lung: Secondary | ICD-10-CM

## 2017-01-16 DIAGNOSIS — IMO0001 Reserved for inherently not codable concepts without codable children: Secondary | ICD-10-CM

## 2017-01-16 HISTORY — PX: IR GENERIC HISTORICAL: IMG1180011

## 2017-01-17 ENCOUNTER — Encounter (HOSPITAL_COMMUNITY): Payer: Self-pay | Admitting: Interventional Radiology

## 2017-01-17 ENCOUNTER — Encounter: Payer: Self-pay | Admitting: Family Medicine

## 2017-01-20 MED ORDER — ATORVASTATIN CALCIUM 40 MG PO TABS: ORAL_TABLET | ORAL | 3 refills | Status: DC

## 2017-01-22 ENCOUNTER — Other Ambulatory Visit (HOSPITAL_COMMUNITY): Payer: Self-pay | Admitting: Interventional Radiology

## 2017-01-22 DIAGNOSIS — IMO0001 Reserved for inherently not codable concepts without codable children: Secondary | ICD-10-CM

## 2017-01-22 DIAGNOSIS — M4850XA Collapsed vertebra, not elsewhere classified, site unspecified, initial encounter for fracture: Principal | ICD-10-CM

## 2017-01-24 ENCOUNTER — Encounter: Payer: Self-pay | Admitting: Nurse Practitioner

## 2017-01-24 ENCOUNTER — Telehealth: Payer: Self-pay | Admitting: Internal Medicine

## 2017-01-24 NOTE — Telephone Encounter (Signed)
Patient needs to change an appointment her appointments for Dr Julien Nordmann  Call back number is [phone]

## 2017-01-30 ENCOUNTER — Other Ambulatory Visit: Payer: Self-pay | Admitting: General Surgery

## 2017-01-30 ENCOUNTER — Other Ambulatory Visit: Payer: Self-pay | Admitting: Radiology

## 2017-01-31 ENCOUNTER — Other Ambulatory Visit (HOSPITAL_COMMUNITY): Payer: Self-pay | Admitting: Interventional Radiology

## 2017-01-31 ENCOUNTER — Encounter (HOSPITAL_COMMUNITY): Payer: Self-pay

## 2017-01-31 ENCOUNTER — Ambulatory Visit (HOSPITAL_COMMUNITY)
Admission: RE | Admit: 2017-01-31 | Discharge: 2017-01-31 | Disposition: A | Discharge status: Home / Self Care | Payer: Medicare Other | Source: Ambulatory Visit | Attending: Interventional Radiology | Admitting: Interventional Radiology

## 2017-01-31 DIAGNOSIS — IMO0001 Reserved for inherently not codable concepts without codable children: Secondary | ICD-10-CM

## 2017-01-31 DIAGNOSIS — Z85528 Personal history of other malignant neoplasm of kidney: Secondary | ICD-10-CM | POA: Diagnosis not present

## 2017-01-31 DIAGNOSIS — J449 Chronic obstructive pulmonary disease, unspecified: Secondary | ICD-10-CM | POA: Diagnosis not present

## 2017-01-31 DIAGNOSIS — Z85118 Personal history of other malignant neoplasm of bronchus and lung: Secondary | ICD-10-CM | POA: Insufficient documentation

## 2017-01-31 DIAGNOSIS — Z923 Personal history of irradiation: Secondary | ICD-10-CM | POA: Insufficient documentation

## 2017-01-31 DIAGNOSIS — Z7982 Long term (current) use of aspirin: Secondary | ICD-10-CM | POA: Diagnosis not present

## 2017-01-31 DIAGNOSIS — F329 Major depressive disorder, single episode, unspecified: Secondary | ICD-10-CM | POA: Diagnosis not present

## 2017-01-31 DIAGNOSIS — M4850XA Collapsed vertebra, not elsewhere classified, site unspecified, initial encounter for fracture: Secondary | ICD-10-CM

## 2017-01-31 DIAGNOSIS — Z8249 Family history of ischemic heart disease and other diseases of the circulatory system: Secondary | ICD-10-CM | POA: Insufficient documentation

## 2017-01-31 DIAGNOSIS — I10 Essential (primary) hypertension: Secondary | ICD-10-CM | POA: Diagnosis not present

## 2017-01-31 DIAGNOSIS — Z7951 Long term (current) use of inhaled steroids: Secondary | ICD-10-CM | POA: Diagnosis not present

## 2017-01-31 DIAGNOSIS — Z905 Acquired absence of kidney: Secondary | ICD-10-CM | POA: Diagnosis not present

## 2017-01-31 DIAGNOSIS — E785 Hyperlipidemia, unspecified: Secondary | ICD-10-CM | POA: Diagnosis not present

## 2017-01-31 DIAGNOSIS — Z9221 Personal history of antineoplastic chemotherapy: Secondary | ICD-10-CM | POA: Diagnosis not present

## 2017-01-31 DIAGNOSIS — Z87891 Personal history of nicotine dependence: Secondary | ICD-10-CM | POA: Diagnosis not present

## 2017-01-31 DIAGNOSIS — M8458XA Pathological fracture in neoplastic disease, other specified site, initial encounter for fracture: Secondary | ICD-10-CM | POA: Insufficient documentation

## 2017-01-31 DIAGNOSIS — F419 Anxiety disorder, unspecified: Secondary | ICD-10-CM | POA: Diagnosis not present

## 2017-01-31 DIAGNOSIS — G4700 Insomnia, unspecified: Secondary | ICD-10-CM | POA: Diagnosis not present

## 2017-01-31 DIAGNOSIS — M4854XA Collapsed vertebra, not elsewhere classified, thoracic region, initial encounter for fracture: Secondary | ICD-10-CM | POA: Diagnosis not present

## 2017-01-31 DIAGNOSIS — M546 Pain in thoracic spine: Secondary | ICD-10-CM | POA: Diagnosis not present

## 2017-01-31 DIAGNOSIS — M8588 Other specified disorders of bone density and structure, other site: Secondary | ICD-10-CM | POA: Diagnosis not present

## 2017-01-31 HISTORY — PX: IR GENERIC HISTORICAL: IMG1180011

## 2017-01-31 LAB — CBC
HCT: 39.4 % (ref 36.0–46.0)
Hemoglobin: 12.4 g/dL (ref 12.0–15.0)
MCH: 29.1 pg (ref 26.0–34.0)
MCHC: 31.5 g/dL (ref 30.0–36.0)
MCV: 92.5 fL (ref 78.0–100.0)
Platelets: 322 10*3/uL (ref 150–400)
RBC: 4.26 MIL/uL (ref 3.87–5.11)
RDW: 18.3 % — ABNORMAL HIGH (ref 11.5–15.5)
WBC: 6.8 10*3/uL (ref 4.0–10.5)

## 2017-01-31 LAB — BASIC METABOLIC PANEL
Anion gap: 12 (ref 5–15)
BUN: 19 mg/dL (ref 6–20)
CO2: 28 mmol/L (ref 22–32)
Calcium: 9.8 mg/dL (ref 8.9–10.3)
Chloride: 101 mmol/L (ref 101–111)
Creatinine, Ser: 1.2 mg/dL — ABNORMAL HIGH (ref 0.44–1.00)
GFR calc Af Amer: 51 mL/min — ABNORMAL LOW (ref >60)
GFR calc non Af Amer: 44 mL/min — ABNORMAL LOW (ref >60)
Glucose, Bld: 108 mg/dL — ABNORMAL HIGH (ref 65–99)
Potassium: 4.3 mmol/L (ref 3.5–5.1)
Sodium: 141 mmol/L (ref 135–145)

## 2017-01-31 LAB — PROTIME-INR
INR: 0.92
Prothrombin Time: 12.3 seconds (ref 11.4–15.2)

## 2017-01-31 LAB — APTT: aPTT: 28 seconds (ref 24–36)

## 2017-01-31 MED ORDER — HYDROMORPHONE HCL 1 MG/ML IJ SOLN
INTRAMUSCULAR | Status: AC
  Filled 2017-01-31: qty 1

## 2017-01-31 MED ORDER — SODIUM CHLORIDE 0.9 % IV SOLN: INTRAVENOUS | Status: DC

## 2017-01-31 MED ORDER — SODIUM CHLORIDE 0.9 % IV SOLN
INTRAVENOUS | Status: AC | PRN
  Administered 2017-01-31: 10 mL/h via INTRAVENOUS

## 2017-01-31 MED ORDER — BUPIVACAINE HCL 0.25 % IJ SOLN
INTRAMUSCULAR | Status: AC | PRN
  Administered 2017-01-31 (×2): 20 mL

## 2017-01-31 MED ORDER — MIDAZOLAM HCL 2 MG/2ML IJ SOLN
INTRAMUSCULAR | Status: AC
  Filled 2017-01-31: qty 4

## 2017-01-31 MED ORDER — ONDANSETRON HCL 4 MG/2ML IJ SOLN
4.0000 mg | Freq: Once | INTRAMUSCULAR | Status: AC
Start: 2017-01-31 — End: 2017-01-31
  Administered 2017-01-31: 4 mg via INTRAVENOUS

## 2017-01-31 MED ORDER — TOBRAMYCIN SULFATE 1.2 G IJ SOLR
INTRAMUSCULAR | Status: AC
  Filled 2017-01-31: qty 1.2

## 2017-01-31 MED ORDER — HYDROMORPHONE HCL 1 MG/ML IJ SOLN
INTRAMUSCULAR | Status: AC | PRN
  Administered 2017-01-31 (×2): 1 mg via INTRAVENOUS

## 2017-01-31 MED ORDER — ALBUTEROL SULFATE (2.5 MG/3ML) 0.083% IN NEBU
INHALATION_SOLUTION | RESPIRATORY_TRACT | Status: AC
  Administered 2017-01-31: 2.5 mg
  Filled 2017-01-31: qty 3

## 2017-01-31 MED ORDER — CEFAZOLIN SODIUM-DEXTROSE 2-4 GM/100ML-% IV SOLN
2.0000 g | INTRAVENOUS | Status: AC
  Administered 2017-01-31: 2 g via INTRAVENOUS

## 2017-01-31 MED ORDER — ONDANSETRON HCL 4 MG/2ML IJ SOLN
INTRAMUSCULAR | Status: AC
  Filled 2017-01-31: qty 2

## 2017-01-31 MED ORDER — MIDAZOLAM HCL 2 MG/2ML IJ SOLN
INTRAMUSCULAR | Status: AC | PRN
  Administered 2017-01-31 (×2): 1 mg via INTRAVENOUS
  Administered 2017-01-31 (×2): 0.5 mg via INTRAVENOUS

## 2017-01-31 MED ORDER — CEFAZOLIN SODIUM-DEXTROSE 2-4 GM/100ML-% IV SOLN
INTRAVENOUS | Status: AC
  Filled 2017-01-31: qty 100

## 2017-01-31 MED ORDER — SODIUM CHLORIDE 0.9 % IV SOLN: INTRAVENOUS | Status: AC

## 2017-01-31 MED ORDER — ALBUTEROL SULFATE (2.5 MG/3ML) 0.083% IN NEBU
2.5000 mg | INHALATION_SOLUTION | RESPIRATORY_TRACT | Status: AC
Start: 2017-01-31 — End: 2017-01-31
  Administered 2017-01-31: 2.5 mg via RESPIRATORY_TRACT

## 2017-01-31 MED ORDER — ALBUTEROL SULFATE (2.5 MG/3ML) 0.083% IN NEBU: 2.5000 mg | INHALATION_SOLUTION | RESPIRATORY_TRACT | Status: DC | PRN

## 2017-01-31 MED ORDER — ONDANSETRON HCL 4 MG/2ML IJ SOLN
4.0000 mg | Freq: Once | INTRAMUSCULAR | Status: AC
  Administered 2017-01-31: 4 mg via INTRAVENOUS

## 2017-01-31 MED ORDER — IOPAMIDOL (ISOVUE-300) INJECTION 61%
INTRAVENOUS | Status: AC
  Administered 2017-01-31: 5 mL
  Filled 2017-01-31: qty 50

## 2017-01-31 MED ORDER — FENTANYL CITRATE (PF) 100 MCG/2ML IJ SOLN
INTRAMUSCULAR | Status: AC
  Filled 2017-01-31: qty 4

## 2017-01-31 MED ORDER — BUPIVACAINE HCL (PF) 0.25 % IJ SOLN
INTRAMUSCULAR | Status: AC
  Filled 2017-01-31: qty 60

## 2017-01-31 MED ORDER — FENTANYL CITRATE (PF) 100 MCG/2ML IJ SOLN
INTRAMUSCULAR | Status: AC | PRN
  Administered 2017-01-31 (×4): 25 ug via INTRAVENOUS

## 2017-01-31 NOTE — Sedation Documentation (Signed)
Called to give report. Nurse unavailable. Will call back 

## 2017-01-31 NOTE — Progress Notes (Addendum)
Pam turpin pa was called emesis  of water/ crackers. Orders followed

## 2017-01-31 NOTE — H&P (Signed)
Chief Complaint: Patient was seen in consultation today for Thoracic 3/4/5 vertebroplasty/kyphoplasty at the request of Dr Mayme Genta  Referring Physician(s): Dr Mayme Genta  Supervising Physician: Luanne Bras  Patient Status: [facility] - Out-pt  History of Present Illness: Tammy Ball is a 76 y.o. female   Pt developed worsening back pain approximately Aug 2017 No injury known Hx Lung Ca and renal cancer After treatment for lung cancer pt is now seeking evaluation and treatment for back pain MRI 12/21/16: IMPRESSION: 1. Focal osseous lesions at the junction of the vertebral body and left pedicle at the T4 and T5 levels. The signal characteristics, enhancement pattern and location are all suggestive of osseous metastatic disease. 2. Compression fractures at T3 and T4 with approximately 25% and 50% height loss, respectively. No significant retropulsion. 3. Contrast enhancement of the T3 superior endplate may be secondary to recent fracture or worsening of fracture and is not specific for metastatic disease.  Past Medical History:  Diagnosis Date  . Anemia    was while doing chemo  . Anxiety   . Asthma   . Chemotherapy-induced neuropathy ([place]) 11/01/2015  . Claustrophobia   . COPD (chronic obstructive pulmonary disease) ([place])   . Depression   . Encounter for antineoplastic chemotherapy 02/09/2016  . Glaucoma   . Headache    prior to menopause  . Heart murmur   . History of echocardiogram    Echo 2/17: EF 62-37%, grade 1 diastolic dysfunction, mild MR, trivial pericardial effusion  . History of nuclear stress test    Myoview 2/17: no ischemia or scar, EF 79%; low risk  . History of radiation therapy 10/30/16-11/06/16  . Hyperlipidemia   . Hypertension   . Insomnia 05/16/2016  . Non-small cell carcinoma of right lung, stage 2 ([place]) 10/03/2015  . Radiation 02/29/16-04/10/16   50.4 Gy to right central chest  . Renal cell carcinoma ([place])    L nephrectomy  in 2012     Past Surgical History:  Procedure Laterality Date  . BACK SURGERY     cervical 1991  . EYE SURGERY    . IR GENERIC HISTORICAL  01/16/2017   IR RADIOLOGIST EVAL & MGMT 01/16/2017 [facility]-INTERV RAD  . kidney cancer    . NEPHRECTOMY    . SPINE SURGERY    . TUBAL LIGATION    . VIDEO ASSISTED THORACOSCOPY (VATS)/WEDGE RESECTION Right 01/18/2016   Procedure: VIDEO ASSISTED THORACOSCOPY (VATS)/LUNG RESECTION, THOROCOTOMY, RIGHT UPPER LOBECTOMY, LYMPH NODE DISSECTION, PLACEMENT OF ON Q;  Surgeon: [name], MD;  Location: [place];  Service: Thoracic;  Laterality: Right;  Marland Kitchen VIDEO BRONCHOSCOPY Bilateral 09/20/2015   Procedure: VIDEO BRONCHOSCOPY WITHOUT FLUORO;  Surgeon: Rigoberto Noel, MD;  Location: [facility];  Service: Cardiopulmonary;  Laterality: Bilateral;  . VIDEO BRONCHOSCOPY N/A 01/18/2016   Procedure: VIDEO BRONCHOSCOPY;  Surgeon: [name], MD;  Location: [facility] OR;  Service: Thoracic;  Laterality: N/A;    Allergies: Bee venom; Amlodipine; Levofloxacin; and Hctz [hydrochlorothiazide]  Medications: Prior to Admission medications   Medication Sig Start Date End Date Taking? Authorizing Provider  acetaminophen (TYLENOL) 500 MG tablet Take 500 mg by mouth every 6 (six) hours as needed for mild pain, moderate pain, fever or headache. Reported on 05/24/2016   Yes Historical Provider, MD  albuterol (PROVENTIL HFA;VENTOLIN HFA) 108 (90 BASE) MCG/ACT inhaler Inhale 2 puffs into the lungs every 4 (four) hours as needed for wheezing or shortness of breath. 05/18/15 01/28/17 Yes [name], MD  aspirin EC 81 MG tablet Take 81 mg by mouth daily. Reported on 04/11/2016   Yes Historical Provider, MD  atorvastatin (LIPITOR) 40 MG tablet TAKE ONE TABLET BY MOUTH ONCE DAILY. 01/20/17  Yes Wardell Honour, MD  cholecalciferol (VITAMIN D) 1000 units tablet Take 1 tablet by mouth daily.   Yes Historical Provider, MD  fish oil-omega-3 fatty acids 1000 MG capsule Take 1 capsule by mouth daily.    Yes  Historical Provider, MD  fluticasone (FLONASE) 50 MCG/ACT nasal spray Place 2 sprays into both nostrils daily as needed for allergies.    Yes Historical Provider, MD  Fluticasone-Salmeterol (ADVAIR) 250-50 MCG/DOSE AEPB Inhale 1 puff into the lungs 2 (two) times daily. 11/14/16  Yes Wardell Honour, MD  metoprolol succinate (TOPROL-XL) 50 MG 24 hr tablet Take 1 tablet (50 mg total) by mouth daily. Take with or immediately following a meal. 10/02/16  Yes Wardell Honour, MD  polyvinyl alcohol-povidone (REFRESH) 1.4-0.6 % ophthalmic solution Place 2 drops into both eyes daily as needed (for dry eyes). Reported on 05/24/2016   Yes Historical Provider, MD  temazepam (RESTORIL) 15 MG capsule Take 1 capsule (15 mg total) by mouth at bedtime as needed for sleep. 12/08/16  Yes Wardell Honour, MD  traMADol (ULTRAM) 50 MG tablet Take 1-2 tablets (50-100 mg total) by mouth every 6 (six) hours as needed for moderate pain. Reported on 05/24/2016 12/08/16  Yes Wardell Honour, MD  albuterol (PROVENTIL) (2.5 MG/3ML) 0.083% nebulizer solution Take 2.5 mg by nebulization every 6 (six) hours as needed for wheezing or shortness of breath.    Historical Provider, MD     Family History  Problem Relation Age of Onset  . Heart disease Sister   . Obesity Brother   . Heart attack Daughter 19    s/p CABG  . Glaucoma Daughter   . Breast cancer Sister   . Birth defects Sister   . Cancer Mother     Bladder Cancer  . Hypertension Mother   . CAD Mother 72  . Cancer Maternal Grandmother     Social History   Social History  . Marital status: Widowed    Spouse name: N/A  . Number of children: N/A  . Years of education: N/A   Social History Main Topics  . Smoking status: Former Smoker    Packs/day: 0.50    Years: 50.00    Types: Cigarettes    Quit date: 03/16/2011  . Smokeless tobacco: Never Used  . Alcohol use No  . Drug use: No  . Sexual activity: Not Currently   Other Topics Concern  . None   Social History  Narrative   Marital status: divorced; not dating.      Children: 4 children; 3 grandchildren adult; 4 gg.      Lives: alone in house      Employment: full time substance abuse counselor; [employer].      Tobacco: quit smoking 2012; smoked 45 years      Alcohol: none      Drugs: none      ADLs: independent with ADLs; drives.       Advanced Directives: YES: HCPOA: Nicholas Martinez/son.  FULL CODE but no prolonged measures.      Occupation: Substance Abuse Estate agent   No exercise** Merged History Encounter **       ** Data from: 12/14/11 Enc Dept: [facility]       **  Data from: 12/17/11 Enc Dept: [facility]   Substance abuse counselor   Husband deceased   4 great grandchildren   Son works in same substance abuse counseling center as patient     Review of Systems: A 12 point ROS discussed and pertinent positives are indicated in the HPI above.  All other systems are negative.  Review of Systems  Constitutional: Positive for activity change. Negative for appetite change, fatigue and fever.  Cardiovascular: Negative for chest pain.  Musculoskeletal: Positive for back pain.  Neurological: Positive for weakness.  Psychiatric/Behavioral: Negative for behavioral problems and confusion.    Vital Signs: BP (!) 172/83   Pulse 79   Temp 98.3 F (36.8 C) (Oral)   Resp 16   Ht '5\' 4"'$  (1.626 m)   Wt 165 lb (74.8 kg)   SpO2 93%   BMI 28.32 kg/m   Physical Exam  Constitutional: She is oriented to person, place, and time.  Cardiovascular: Normal rate, regular rhythm and normal heart sounds.   Pulmonary/Chest: Effort normal and breath sounds normal.  Abdominal: Soft. Bowel sounds are normal. There is no tenderness.  Musculoskeletal: Normal range of motion.  Mid back pain to palpate  Neurological: She is alert and oriented to person, place, and time.  Skin: Skin is warm and dry.  Psychiatric: She has a normal mood and affect. Her  behavior is normal. Judgment and thought content normal.  Nursing note and vitals reviewed.   Mallampati Score:  MD Evaluation Airway: WNL Heart: WNL Abdomen: WNL Chest/ Lungs: WNL ASA  Classification: 3 Mallampati/Airway Score: One  Imaging: Ir Radiologist Eval & Mgmt  Result Date: 01/17/2017 EXAM: NEW PATIENT OFFICE VISIT CHIEF COMPLAINT: Severe pain in the interscapular area due to compression fractures at T3 and T4. Abnormality at T5. Current Pain Level: 1-10 HISTORY OF PRESENT ILLNESS: The patient is a 76 year old lady who has been referred for evaluation of pain relief due to compression fractures at T3 and T4, and also to consider performing biopsies by her radiation oncologist. As per patient and her notes, she is known to have a history of non-small cell lung carcinoma stage IIIA and received radiation to the right central chest. She also recently completed an SBRT for an oligometastatic lesion in the left lower lobe. The patient gives a 2 month history of severe upper back pain which has been steadily progressive to the point where it is almost a 10 out of 10, especially after prolonged standing, sitting or turning. She reports a modest relief with tramadol and Tylenol. She remains active in terms of work ambulatory independently. She is able to drive without difficulty. She denies any radiation of her pain circumferentially or into the upper cervical or lower lumbar or thoracic regions. Her appetite remains normal.  Her weight has been steady. She denies any autonomic dysfunction of her bowel or bladder activities. She denies recent chills, fever or rigors. The patient maintains a regular working day schedule. Past Medical History: Patient has a past history of left-sided nephrectomy for carcinoma followed by chemotherapy and radiation in 2012. She is being followed on a regular basis by her urologist Dr. Alinda Money. She also sees Dr. Julien Nordmann for her lung carcinoma. History of herniated disc  in the cervical region. Tubal ligation October 1972. Medications: Lipitor. Metoprolol. Aspirin 81 mg a day. Omega 3 fatty acid tablets. Vitamin D3. Flonase spray. Tylenol p.r.n. Tramadol 50 mg 3 times a day or p.r.n. Advair tablets. Polyvinyl alcohol povidone ophthalmic solution. Restoril.  Albuterol nebulizer solution for wheezing as needed. Allergies: Bee venom, amlodipine, levofloxacin, and hydrochlorothiazide. Social History: The patient has two boys and girls alive and well. Denies smoking cigarettes or using alcohol. Denies using illicit chemicals. Family History: History of asthma in her daughter. Migraine headaches in son and daughter. Daughter and mother had heart problems. High blood pressure. Mother deceased of bladder carcinoma. Father deceased of unknown causes. REVIEW OF SYSTEMS: Negative unless as mentioned above. PHYSICAL EXAMINATION: In mild acute distress on account of the pain in the scapular region. Affect, otherwise, normal. Neurologically grossly intact. Palpation of her upper thoracic region was not performed. ASSESSMENT AND PLAN: The patient's recent MRI of the thoracic spine was reviewed. This reveals the presence of compression fractures at T3 and T4, and abnormal enlargement and enhancement of the left pedicle at T5 and at T4. Findings at T4 and T5 suspicious of metastatic disease. There is mild retropulsion at T4 with thinning of the anterior subarachnoid space. However, the thoracic cord appears normal in signal and morphology. Vertebral body augmentation for pain relief and to potentially prevent further collapse was discussed at T3 and T4. At T4 and T5, biopsies through the left pedicles at both levels could also be obtained. The procedure of vertebroplasty, the indications, the potential complications and alternatives were reviewed with the patient. Questions were answered to her satisfaction. The patient is willing to proceed with treatment at T3-T4 in terms of augmentation and biopsy  at T4, and with just a biopsy at T5 as described above. We will discuss this plan with the patient's referring radiation oncologist Dr. Melina Modena. The patient leaves with good understanding and agreement with the above management plan. Electronically Signed   By: Luanne Bras M.D.   On: 01/16/2017 15:29    Labs:  CBC:  Recent Labs  09/20/16 1208 10/02/16 1414 10/25/16 1110 10/30/16 0939 01/31/17 0700  WBC 6.0 8.2 6.9 10.8 6.8  HGB 11.8 11.8 12.3  --  12.4  HCT 36.6 37.8 36.1* 39.6 39.4  PLT 303 345  --  406* 322    COAGS:  Recent Labs  01/31/17 0700  INR 0.92  APTT 28    BMP:  Recent Labs  07/30/16 2001  09/20/16 1208 10/02/16 1356 10/30/16 0939 12/21/16 1332 01/31/17 0700  NA 138  < > 143 140 143  --  141  K 4.7  < > 5.3 no hemolysis noted* 5.0 4.8  --  4.3  CL 103  --   --  103 98  --  101  CO2 28  < > '26 26 24  '$ --  28  GLUCOSE 110*  < > 100 93 111*  --  108*  BUN 40*  < > 23.9 22 29*  --  19  CALCIUM 9.2  < > 10.3 9.7 10.0  --  9.8  CREATININE 1.40*  < > 1.1 1.38* 1.23* 1.30* 1.20*  GFRNONAA 37*  --   --   --  44*  --  44*  GFRAA 42*  --   --   --  50*  --  51*  < > = values in this interval not displayed.  LIVER FUNCTION TESTS:  Recent Labs  08/14/16 0851 09/20/16 1208 10/02/16 1356 10/30/16 0939  BILITOT 0.35 0.42 0.4 0.3  AST '17 17 17 14  '$ ALT '21 15 13 14  '$ ALKPHOS 85 107 84 103  PROT 7.3 7.6 7.1 7.8  ALBUMIN 3.7 3.8 4.4 4.5    TUMOR MARKERS: No  results for input(s): AFPTM, CEA, CA199, CHROMGRNA in the last 8760 hours.  Assessment and Plan:  Hx Lung and renal Ca Worsening back pain +fractures per MRI Thoracic 3 and 4 Abnormal T5 pedicle Now scheduled for vertebroplasty/kyphoplasty T 3/4 and bx T5 Risks and Benefits discussed with the patient including, but not limited to education regarding the natural healing process of compression fractures without intervention, bleeding, infection, cement migration which may cause spinal cord  damage, paralysis, pulmonary embolism or even death. All of the patient's questions were answered, patient is agreeable to proceed. Consent signed and in chart.  Thank you for this interesting consult.  I greatly enjoyed meeting GIZZELLE LACOMB and look forward to participating in their care.  A copy of this report was sent to the requesting provider on this date.  Electronically Signed: Monia Sabal A 01/31/2017, 8:03 AM   I spent a total of  30 Minutes   in face to face in clinical consultation, greater than 50% of which was counseling/coordinating care for Thoracic 3/4 VP/KP

## 2017-01-31 NOTE — Discharge Instructions (Addendum)
Balloon Kyphoplasty, Care After Refer to this sheet in the next few weeks. These instructions provide you with information about caring for yourself after your procedure. Your health care provider may also give you more specific instructions. Your treatment has been planned according to current medical practices, but problems sometimes occur. Call your health care provider if you have any problems or questions after your procedure. What can I expect after the procedure? After your procedure, it is common to have back pain. Follow these instructions at home: Incision care   Follow instructions from your health care provider about how to take care of your incisions. Make sure you:  Wash your hands with soap and water before you change your bandage (dressing). If soap and water are not available, use hand sanitizer.  Change your dressing as told by your health care provider.  Leave stitches (sutures), skin glue, or adhesive strips in place. These skin closures may need to be in place for 2 weeks or longer. If adhesive strip edges start to loosen and curl up, you may trim the loose edges. Do not remove adhesive strips completely unless your health care provider tells you to do that.  Check your incision area every day for signs of infection. Watch for:  Redness, swelling, or pain.  Fluid, blood, or pus.  Keep your dressing dry until your health care provider says that it can be removed. Activity    Rest your back and avoid intense physical activity for as long as told by your health care provider.  Return to your normal activities as told by your health care provider. Ask your health care provider what activities are safe for you.  Do not lift anything that is heavier than 10 lb (4.5 kg). This is about the weight of a gallon of milk.You may need to avoid heavy lifting for several weeks. General instructions   Take over-the-counter and prescription medicines only as told by your health  care provider.  If directed, apply ice to the painful area:  Put ice in a plastic bag.  Place a towel between your skin and the bag.  Leave the ice on for 20 minutes, 2-3 times per day.  Do not use tobacco products, including cigarettes, chewing tobacco, or e-cigarettes. If you need help quitting, ask your health care provider.  Keep all follow-up visits as told by your health care provider. This is important. Contact a health care provider if:  You have a fever.  You have redness, swelling, or pain at the site of your incisions.  You have fluid, blood, or pus coming from your incisions.  You have pain that gets worse or does not get better with medicine.  You develop numbness or weakness in any part of your body. Get help right away if:  You have chest pain.  You have difficulty breathing.  You cannot move your legs.  You cannot control your bladder or bowel movements.  You suddenly become weak or numb on one side of your body.  You become very confused.  You have trouble speaking or understanding, or both. This information is not intended to replace advice given to you by your health care provider. Make sure you discuss any questions you have with your health care provider. Document Released: 07/20/2015 Document Revised: 04/05/2016 Document Reviewed: 02/21/2015 Elsevier Interactive Patient Education  2017 [place]. Moderate Conscious Sedation, Adult, Care After These instructions provide you with information about caring for yourself after your procedure. Your health care provider may  also give you more specific instructions. Your treatment has been planned according to current medical practices, but problems sometimes occur. Call your health care provider if you have any problems or questions after your procedure. What can I expect after the procedure? After your procedure, it is common:  To feel sleepy for several hours.  To feel clumsy and have poor balance  for several hours.  To have poor judgment for several hours.  To vomit if you eat too soon. Follow these instructions at home: For at least 24 hours after the procedure:    Do not:  Participate in activities where you could fall or become injured.  Drive.  Use heavy machinery.  Drink alcohol.  Take sleeping pills or medicines that cause drowsiness.  Make important decisions or sign legal documents.  Take care of children on your own.  Rest. Eating and drinking   Follow the diet recommended by your health care provider.  If you vomit:  Drink water, juice, or soup when you can drink without vomiting.  Make sure you have little or no nausea before eating solid foods. General instructions   Have a responsible adult stay with you until you are awake and alert.  Take over-the-counter and prescription medicines only as told by your health care provider.  If you smoke, do not smoke without supervision.  Keep all follow-up visits as told by your health care provider. This is important. Contact a health care provider if:  You keep feeling nauseous or you keep vomiting.  You feel light-headed.  You develop a rash.  You have a fever. Get help right away if:  You have trouble breathing. This information is not intended to replace advice given to you by your health care provider. Make sure you discuss any questions you have with your health care provider. Document Released: 08/19/2013 Document Revised: 04/02/2016 Document Reviewed: 02/18/2016 Elsevier Interactive Patient Education  2017 [place]. 1.No stooping,bending orlifting more than 10  lbs for 2 weeks 2.Use walker to ambulate for 2 weeks  3.RTC PRN in 2 weeks KYPHOPLASTY/VERTEBROPLASTY DISCHARGE INSTRUCTIONS  Medications: (check all that apply)     Resume all home medications as before procedure.                     Continue your pain medications as prescribed as needed.  Over the next 3-5 days,  decrease your pain medication as tolerated.  Over the counter medications (i.e. Tylenol, ibuprofen, and aleve) may be substituted once severe/moderate pain symptoms have subsided.   Wound Care: - Bandages may be removed the day following your procedure.  You may get your incision wet once bandages are removed.  Bandaids may be used to cover the incisions until scab formation.  Topical ointments are optional.  - If you develop a fever greater than 101 degrees, have increased skin redness at the incision sites or pus-like oozing from incisions occurring within 1 week of the procedure, contact radiology at [phone] or [phone].  - Ice pack to back for 15-20 minutes 2-3 time per day for first 2-3 days post procedure.  The ice will expedite muscle healing and help with the pain from the incisions.   Activity: - Bedrest today with limited activity for 24 hours post procedure.  - No driving for 48 hours.  - Increase your activity as tolerated after bedrest (with assistance if necessary).  - Refrain from any strenuous activity or heavy lifting (greater than 10 lbs.).   Follow up: -   -  If a biopsy was performed at the time of your procedure, your referring physician should receive the results in usually 2-3 days.

## 2017-01-31 NOTE — Procedures (Signed)
S/P T3 VP and biopsy T4 VP and biopsy T5 core biopsy

## 2017-02-01 ENCOUNTER — Telehealth: Payer: Self-pay | Admitting: Radiology

## 2017-02-01 ENCOUNTER — Other Ambulatory Visit: Payer: Medicare Other

## 2017-02-01 NOTE — Progress Notes (Signed)
   Pt had left message on machine at 435pm yesterday She was still having some nausea and had had 1 vomit at home  Received message this am Called her back at 625 am Pt answered phone immediately Says her nausea has calmed down and has been able to keep gingerale and crackers down from last night.  Does state she has low grade temp of 100.1 this am Pain is better Moving all 4s No other symptoms  Asked pt to take 2 Tylenol for low grade temp Gradually increase diet  I will call back today to check on her

## 2017-02-01 NOTE — Progress Notes (Signed)
  Spoke to pt again today Feels much better No N/V afeb  She will hear from scheduler for follow up date and time Post T3 and T4 VP with Bx at T5 01/31/17

## 2017-02-04 ENCOUNTER — Encounter (HOSPITAL_COMMUNITY): Payer: Self-pay | Admitting: Interventional Radiology

## 2017-02-05 ENCOUNTER — Ambulatory Visit: Payer: Medicare Other | Admitting: Internal Medicine

## 2017-02-05 ENCOUNTER — Telehealth: Payer: Self-pay | Admitting: Internal Medicine

## 2017-02-05 ENCOUNTER — Encounter: Payer: Self-pay | Admitting: Internal Medicine

## 2017-02-05 ENCOUNTER — Ambulatory Visit (HOSPITAL_BASED_OUTPATIENT_CLINIC_OR_DEPARTMENT_OTHER): Payer: Medicare Other | Admitting: Internal Medicine

## 2017-02-05 VITALS — BP 147/75 | HR 79 | Temp 98.6°F | Resp 18 | Ht 64.0 in | Wt 164.7 lb

## 2017-02-05 DIAGNOSIS — C771 Secondary and unspecified malignant neoplasm of intrathoracic lymph nodes: Secondary | ICD-10-CM | POA: Diagnosis not present

## 2017-02-05 DIAGNOSIS — C3411 Malignant neoplasm of upper lobe, right bronchus or lung: Secondary | ICD-10-CM | POA: Diagnosis not present

## 2017-02-05 DIAGNOSIS — C3491 Malignant neoplasm of unspecified part of right bronchus or lung: Secondary | ICD-10-CM

## 2017-02-05 NOTE — Telephone Encounter (Signed)
Gave patient AVS and calender per 02/04/2017 los. [facility] to contact patient with CT scan appt.

## 2017-02-05 NOTE — Progress Notes (Signed)
[facility] [phone]   Fax:([phone]) [place] [address]  DIAGNOSIS: Stage IIIA (T2a, N2, M0) non-small cell lung cancer, squamous cell carcinoma presented with large right upper lobe lung mass with right hilar involvement diagnosed in November 2016.  PRIOR THERAPY:  1) Neoadjuvant systemic chemotherapy with carboplatin for AUC of 6 and paclitaxel 200 MG/M2 every 3 weeks, status post 3 cycles with partial response. 2) Video bronchoscopy, right video-assisted thoracoscopy, minithoracotomy, right upper lobectomy with lymph node dissection on 01/18/2016. The final pathology revealed residual squamous cell carcinoma measuring 3.1 cm with close resection margin and metastatic carcinoma to lymph nodes at levels 10R, 12R and 4R. (ypT2a, yN2).  3) Concurrent chemoradiation with weekly carboplatin for AUC of 2 and paclitaxel 45 MG/M2. First dose 02/27/2016. Status post 5 cycles. Last dose was given 03/26/2016. 4) SBRT to left lower lobe lung nodule under the care of Dr. Sondra Come completed on 11/06/2016.  CURRENT THERAPY: Observation.  INTERVAL HISTORY: Tammy Ball 76 y.o. female returns to the clinic today for follow-up visit. The patient is feeling fine today with no specific complaints. She recently underwent vertebroplasty and biopsies of T3, T4 and T5 under the care of Dr. Estanislado Pandy. The biopsy showed no evidence of metastatic disease to the bone. She denied having any chest pain, shortness of breath, cough or hemoptysis. She has no weight loss or night sweats. She has some nausea earlier today but this was resolved with her nausea medication. She has no fever or chills. She was supposed to have repeat CT scan of the chest before the visit today but unfortunately the scan is scheduled for 02/07/2017.  MEDICAL HISTORY: Past Medical History:  Diagnosis Date  . Anemia    was while doing chemo  .  Anxiety   . Asthma   . Chemotherapy-induced neuropathy ([place]) 11/01/2015  . Claustrophobia   . COPD (chronic obstructive pulmonary disease) ([place])   . Depression   . Encounter for antineoplastic chemotherapy 02/09/2016  . Glaucoma   . Headache    prior to menopause  . Heart murmur   . History of echocardiogram    Echo 2/17: EF 75-10%, grade 1 diastolic dysfunction, mild MR, trivial pericardial effusion  . History of nuclear stress test    Myoview 2/17: no ischemia or scar, EF 79%; low risk  . History of radiation therapy 10/30/16-11/06/16  . Hyperlipidemia   . Hypertension   . Insomnia 05/16/2016  . Non-small cell carcinoma of right lung, stage 2 ([place]) 10/03/2015  . Radiation 02/29/16-04/10/16   50.4 Gy to right central chest  . Renal cell carcinoma ([place])    L nephrectomy  in 2012    ALLERGIES:  is allergic to bee venom; amlodipine; levofloxacin; and hctz [hydrochlorothiazide].  MEDICATIONS:  Current Outpatient Prescriptions  Medication Sig Dispense Refill  . acetaminophen (TYLENOL) 500 MG tablet Take 500 mg by mouth every 6 (six) hours as needed for mild pain, moderate pain, fever or headache. Reported on 05/24/2016    . albuterol (PROVENTIL HFA;VENTOLIN HFA) 108 (90 BASE) MCG/ACT inhaler Inhale 2 puffs into the lungs every 4 (four) hours as needed for wheezing or shortness of breath. 1 Inhaler 0  . albuterol (PROVENTIL) (2.5 MG/3ML) 0.083% nebulizer solution Take 2.5 mg by nebulization every 6 (six) hours as needed for wheezing or shortness of breath.    Marland Kitchen aspirin EC 81 MG tablet Take 81 mg by mouth daily.  Reported on 04/11/2016    . atorvastatin (LIPITOR) 40 MG tablet TAKE ONE TABLET BY MOUTH ONCE DAILY. 90 tablet 3  . cholecalciferol (VITAMIN D) 1000 units tablet Take 1 tablet by mouth daily.    . fish oil-omega-3 fatty acids 1000 MG capsule Take 1 capsule by mouth daily.     . fluticasone (FLONASE) 50 MCG/ACT nasal spray Place 2 sprays into both nostrils daily as needed for  allergies.     . Fluticasone-Salmeterol (ADVAIR) 250-50 MCG/DOSE AEPB Inhale 1 puff into the lungs 2 (two) times daily. 60 each 11  . metoprolol succinate (TOPROL-XL) 50 MG 24 hr tablet Take 1 tablet (50 mg total) by mouth daily. Take with or immediately following a meal. 30 tablet 5  . polyvinyl alcohol-povidone (REFRESH) 1.4-0.6 % ophthalmic solution Place 2 drops into both eyes daily as needed (for dry eyes). Reported on 05/24/2016    . temazepam (RESTORIL) 15 MG capsule Take 1 capsule (15 mg total) by mouth at bedtime as needed for sleep. 30 capsule 0  . traMADol (ULTRAM) 50 MG tablet Take 1-2 tablets (50-100 mg total) by mouth every 6 (six) hours as needed for moderate pain. Reported on 05/24/2016 120 tablet 02   Current Facility-Administered Medications  Medication Dose Route Frequency Provider Last Rate Last Dose  . levalbuterol (XOPENEX) nebulizer solution 0.63 mg  0.63 mg Nebulization Once [name], PA-C        SURGICAL HISTORY:  Past Surgical History:  Procedure Laterality Date  . BACK SURGERY     cervical 1991  . EYE SURGERY    . IR GENERIC HISTORICAL  01/16/2017   IR RADIOLOGIST EVAL & MGMT 01/16/2017 [facility]  . IR GENERIC HISTORICAL  01/31/2017   IR FLUORO GUIDED NEEDLE PLC ASPIRATION/INJECTION LOC 01/31/2017 Luanne Bras, MD [facility]  . IR GENERIC HISTORICAL  01/31/2017   IR VERTEBROPLASTY CERV/THOR BX INC UNI/BIL INC/INJECT/IMAGING 01/31/2017 Luanne Bras, MD [facility]  . IR GENERIC HISTORICAL  01/31/2017   IR VERTEBROPLASTY EA ADDL (T&LS) BX INC UNI/BIL INC INJECT/IMAGING 01/31/2017 Luanne Bras, MD [facility]  . kidney cancer    . NEPHRECTOMY    . SPINE SURGERY    . TUBAL LIGATION    . VIDEO ASSISTED THORACOSCOPY (VATS)/WEDGE RESECTION Right 01/18/2016   Procedure: VIDEO ASSISTED THORACOSCOPY (VATS)/LUNG RESECTION, THOROCOTOMY, RIGHT UPPER LOBECTOMY, LYMPH NODE DISSECTION, PLACEMENT OF ON Q;  Surgeon: [name], MD;  Location: [facility];   Service: Thoracic;  Laterality: Right;  Marland Kitchen VIDEO BRONCHOSCOPY Bilateral 09/20/2015   Procedure: VIDEO BRONCHOSCOPY WITHOUT FLUORO;  Surgeon: Rigoberto Noel, MD;  Location: [facility];  Service: Cardiopulmonary;  Laterality: Bilateral;  . VIDEO BRONCHOSCOPY N/A 01/18/2016   Procedure: VIDEO BRONCHOSCOPY;  Surgeon: [name], MD;  Location: [facility] OR;  Service: Thoracic;  Laterality: N/A;    REVIEW OF SYSTEMS:  A comprehensive review of systems was negative except for: Musculoskeletal: positive for arthralgias   PHYSICAL EXAMINATION: General appearance: alert, cooperative and no distress Head: Normocephalic, without obvious abnormality, atraumatic Neck: no adenopathy, no JVD, supple, symmetrical, trachea midline and thyroid not enlarged, symmetric, no tenderness/mass/nodules Lymph nodes: Cervical, supraclavicular, and axillary nodes normal. Resp: clear to auscultation bilaterally Back: symmetric, no curvature. ROM normal. No CVA tenderness. Cardio: regular rate and rhythm, S1, S2 normal, no murmur, click, rub or gallop GI: soft, non-tender; bowel sounds normal; no masses,  no organomegaly Extremities: extremities normal, atraumatic, no cyanosis or edema  ECOG PERFORMANCE STATUS: 1 - Symptomatic but completely ambulatory  Blood pressure (!) 147/75, pulse 79, temperature 98.6 F (37 C), temperature source Oral, resp. rate 18, height '5\' 4"'$  (1.626 m), weight 164 lb 11.2 oz (74.7 kg), SpO2 92 %.  LABORATORY DATA: Lab Results  Component Value Date   WBC 6.8 01/31/2017   HGB 12.4 01/31/2017   HCT 39.4 01/31/2017   MCV 92.5 01/31/2017   PLT 322 01/31/2017      Chemistry      Component Value Date/Time   NA 141 01/31/2017 0700   NA 143 10/30/2016 0939   NA 143 09/20/2016 1208   K 4.3 01/31/2017 0700   K 5.3 no hemolysis noted (H) 09/20/2016 1208   CL 101 01/31/2017 0700   CO2 28 01/31/2017 0700   CO2 26 09/20/2016 1208   BUN 19 01/31/2017 0700   BUN 29 (H) 10/30/2016 0939   BUN  23.9 09/20/2016 1208   CREATININE 1.20 (H) 01/31/2017 0700   CREATININE 1.38 (H) 10/02/2016 1356   CREATININE 1.1 09/20/2016 1208   GLU 105 12/10/2009      Component Value Date/Time   CALCIUM 9.8 01/31/2017 0700   CALCIUM 10.3 09/20/2016 1208   ALKPHOS 103 10/30/2016 0939   ALKPHOS 107 09/20/2016 1208   AST 14 10/30/2016 0939   AST 17 09/20/2016 1208   ALT 14 10/30/2016 0939   ALT 15 09/20/2016 1208   BILITOT 0.3 10/30/2016 0939   BILITOT 0.42 09/20/2016 1208       RADIOGRAPHIC STUDIES: Ir Fluoro Guide Ndl Plmt / Bx  Result Date: 02/04/2017 INDICATION: Severe upper thoracic pain secondary to pathologic compression fractures at T3, T4, and abnormality of T5 vertebral body. Patient with past history of neoplasia. EXAM: VERTEBROPLASTY AND BIOPSY AT T3, VERTEBROPLASTY AND BIOPSY T4, CORE BONE BIOPSY AT T5. MEDICATIONS: As antibiotic prophylaxis, Ancef 2 g IV was ordered pre-procedure and administered intravenously within 1 hour of incision. ANESTHESIA/SEDATION: Moderate (conscious) sedation was employed during this procedure. A total of Versed 3 mg and Fentanyl 100 mcg, 2 mg Dilaudid IV was administered intravenously. Moderate Sedation Time: 50 minutes. The patient's level of consciousness and vital signs were monitored continuously by radiology nursing throughout the procedure under my direct supervision. FLUOROSCOPY TIME:  Fluoroscopy Time: 50 minutes 30 seconds (6063 mGy) COMPLICATIONS: None immediate. TECHNIQUE: Informed written consent was obtained from the patient after a thorough discussion of the procedural risks, benefits and alternatives. All questions were addressed. Maximal Sterile Barrier Technique was utilized including caps, mask, sterile gowns, sterile gloves, sterile drape, hand hygiene and skin antiseptic. A timeout was performed prior to the initiation of the procedure. PROCEDURE: The patient was placed prone on the fluoroscopic table. Nasal oxygen was administered. Physiologic  monitoring was performed throughout the duration of the procedure. The skin overlying the thoracic region was prepped and draped in the usual sterile fashion. The T3, T4 and T5 vertebral bodies were identified and the right pedicle at T3, the left pedicle at T4, and the left pedicle at T5 were then infiltrated with 0.25% bupivacaine followed by the advancement of 13 gauge Cook spinal needles into the posterior 1/3 at the T3, T4 and T5. Through each of these needles, 16 gauge core biopsy needles were advanced in a coaxial manner through each of the levels. Using a 20 mL syringe, 2 passes were made at each level, and samples of the tissue obtained were sent for pathologic analysis. The needle at T5 was removed. Hemostasis was achieved at the skin entry site. At T3-T4, the needles were advanced  to within 5 mm of the anterior aspect of T3 and T4. Venograms were performed through the needles to ensure no measure of venous structures in the vicinity prior to the installation of the methylmethacrylate mixture. At this time, methylmethacrylate mixture was reconstituted. Under biplane intermittent fluoroscopy, the methylmethacrylate was then injected into the T3 and T4 vertebral bodies with excellent filling. No extravasation was noted into the disk spaces or posteriorly into the spinal canal. No epidural venous contamination was seen. At the T4 there was minimal extrusion of the methylmethacrylate mixture from the fracture cleft along the superior endplate. The needles were then removed. Hemostasis was achieved at the skin entry sites. There were no acute complications. Patient tolerated the procedure well. The patient was observed for 3 hours and discharged in good condition under the care of her son. IMPRESSION: Status post vertebral body augmentation for painful compression fracture at T3 and T4 using vertebroplasty technique. Deep core bone biopsy at T5 as above. The patient and her son were given instructions regarding  postprocedural management. Also they were informed to call her oncologist regarding the results of the biopsies. Questions were answered to their satisfaction. They both leave with good understanding and agreement with the above management plan. Electronically Signed   By: Luanne Bras M.D.   On: 02/01/2017 10:43   Ir Vertebroplasty Cerv/thor Bx Inc Uni/bil Inc/inject/imaging  Result Date: 02/04/2017 INDICATION: Severe upper thoracic pain secondary to pathologic compression fractures at T3, T4, and abnormality of T5 vertebral body. Patient with past history of neoplasia. EXAM: VERTEBROPLASTY AND BIOPSY AT T3, VERTEBROPLASTY AND BIOPSY T4, CORE BONE BIOPSY AT T5. MEDICATIONS: As antibiotic prophylaxis, Ancef 2 g IV was ordered pre-procedure and administered intravenously within 1 hour of incision. ANESTHESIA/SEDATION: Moderate (conscious) sedation was employed during this procedure. A total of Versed 3 mg and Fentanyl 100 mcg, 2 mg Dilaudid IV was administered intravenously. Moderate Sedation Time: 50 minutes. The patient's level of consciousness and vital signs were monitored continuously by radiology nursing throughout the procedure under my direct supervision. FLUOROSCOPY TIME:  Fluoroscopy Time: 50 minutes 30 seconds (9371 mGy) COMPLICATIONS: None immediate. TECHNIQUE: Informed written consent was obtained from the patient after a thorough discussion of the procedural risks, benefits and alternatives. All questions were addressed. Maximal Sterile Barrier Technique was utilized including caps, mask, sterile gowns, sterile gloves, sterile drape, hand hygiene and skin antiseptic. A timeout was performed prior to the initiation of the procedure. PROCEDURE: The patient was placed prone on the fluoroscopic table. Nasal oxygen was administered. Physiologic monitoring was performed throughout the duration of the procedure. The skin overlying the thoracic region was prepped and draped in the usual sterile  fashion. The T3, T4 and T5 vertebral bodies were identified and the right pedicle at T3, the left pedicle at T4, and the left pedicle at T5 were then infiltrated with 0.25% bupivacaine followed by the advancement of 13 gauge Cook spinal needles into the posterior 1/3 at the T3, T4 and T5. Through each of these needles, 16 gauge core biopsy needles were advanced in a coaxial manner through each of the levels. Using a 20 mL syringe, 2 passes were made at each level, and samples of the tissue obtained were sent for pathologic analysis. The needle at T5 was removed. Hemostasis was achieved at the skin entry site. At T3-T4, the needles were advanced to within 5 mm of the anterior aspect of T3 and T4. Venograms were performed through the needles to ensure no measure of venous structures  in the vicinity prior to the installation of the methylmethacrylate mixture. At this time, methylmethacrylate mixture was reconstituted. Under biplane intermittent fluoroscopy, the methylmethacrylate was then injected into the T3 and T4 vertebral bodies with excellent filling. No extravasation was noted into the disk spaces or posteriorly into the spinal canal. No epidural venous contamination was seen. At the T4 there was minimal extrusion of the methylmethacrylate mixture from the fracture cleft along the superior endplate. The needles were then removed. Hemostasis was achieved at the skin entry sites. There were no acute complications. Patient tolerated the procedure well. The patient was observed for 3 hours and discharged in good condition under the care of her son. IMPRESSION: Status post vertebral body augmentation for painful compression fracture at T3 and T4 using vertebroplasty technique. Deep core bone biopsy at T5 as above. The patient and her son were given instructions regarding postprocedural management. Also they were informed to call her oncologist regarding the results of the biopsies. Questions were answered to their  satisfaction. They both leave with good understanding and agreement with the above management plan. Electronically Signed   By: Luanne Bras M.D.   On: 02/01/2017 10:43   Ir Vertebroplasty Ea Addl (t&ls) Bx Inc Uni/bil Inc Inject/imaging  Result Date: 02/04/2017 INDICATION: Severe upper thoracic pain secondary to pathologic compression fractures at T3, T4, and abnormality of T5 vertebral body. Patient with past history of neoplasia. EXAM: VERTEBROPLASTY AND BIOPSY AT T3, VERTEBROPLASTY AND BIOPSY T4, CORE BONE BIOPSY AT T5. MEDICATIONS: As antibiotic prophylaxis, Ancef 2 g IV was ordered pre-procedure and administered intravenously within 1 hour of incision. ANESTHESIA/SEDATION: Moderate (conscious) sedation was employed during this procedure. A total of Versed 3 mg and Fentanyl 100 mcg, 2 mg Dilaudid IV was administered intravenously. Moderate Sedation Time: 50 minutes. The patient's level of consciousness and vital signs were monitored continuously by radiology nursing throughout the procedure under my direct supervision. FLUOROSCOPY TIME:  Fluoroscopy Time: 50 minutes 30 seconds (8295 mGy) COMPLICATIONS: None immediate. TECHNIQUE: Informed written consent was obtained from the patient after a thorough discussion of the procedural risks, benefits and alternatives. All questions were addressed. Maximal Sterile Barrier Technique was utilized including caps, mask, sterile gowns, sterile gloves, sterile drape, hand hygiene and skin antiseptic. A timeout was performed prior to the initiation of the procedure. PROCEDURE: The patient was placed prone on the fluoroscopic table. Nasal oxygen was administered. Physiologic monitoring was performed throughout the duration of the procedure. The skin overlying the thoracic region was prepped and draped in the usual sterile fashion. The T3, T4 and T5 vertebral bodies were identified and the right pedicle at T3, the left pedicle at T4, and the left pedicle at T5 were  then infiltrated with 0.25% bupivacaine followed by the advancement of 13 gauge Cook spinal needles into the posterior 1/3 at the T3, T4 and T5. Through each of these needles, 16 gauge core biopsy needles were advanced in a coaxial manner through each of the levels. Using a 20 mL syringe, 2 passes were made at each level, and samples of the tissue obtained were sent for pathologic analysis. The needle at T5 was removed. Hemostasis was achieved at the skin entry site. At T3-T4, the needles were advanced to within 5 mm of the anterior aspect of T3 and T4. Venograms were performed through the needles to ensure no measure of venous structures in the vicinity prior to the installation of the methylmethacrylate mixture. At this time, methylmethacrylate mixture was reconstituted. Under biplane intermittent fluoroscopy,  the methylmethacrylate was then injected into the T3 and T4 vertebral bodies with excellent filling. No extravasation was noted into the disk spaces or posteriorly into the spinal canal. No epidural venous contamination was seen. At the T4 there was minimal extrusion of the methylmethacrylate mixture from the fracture cleft along the superior endplate. The needles were then removed. Hemostasis was achieved at the skin entry sites. There were no acute complications. Patient tolerated the procedure well. The patient was observed for 3 hours and discharged in good condition under the care of her son. IMPRESSION: Status post vertebral body augmentation for painful compression fracture at T3 and T4 using vertebroplasty technique. Deep core bone biopsy at T5 as above. The patient and her son were given instructions regarding postprocedural management. Also they were informed to call her oncologist regarding the results of the biopsies. Questions were answered to their satisfaction. They both leave with good understanding and agreement with the above management plan. Electronically Signed   By: Luanne Bras  M.D.   On: 02/01/2017 10:43   Ir Radiologist Eval & Mgmt  Result Date: 01/17/2017 EXAM: NEW PATIENT OFFICE VISIT CHIEF COMPLAINT: Severe pain in the interscapular area due to compression fractures at T3 and T4. Abnormality at T5. Current Pain Level: 1-10 HISTORY OF PRESENT ILLNESS: The patient is a 76 year old lady who has been referred for evaluation of pain relief due to compression fractures at T3 and T4, and also to consider performing biopsies by her radiation oncologist. As per patient and her notes, she is known to have a history of non-small cell lung carcinoma stage IIIA and received radiation to the right central chest. She also recently completed an SBRT for an oligometastatic lesion in the left lower lobe. The patient gives a 2 month history of severe upper back pain which has been steadily progressive to the point where it is almost a 10 out of 10, especially after prolonged standing, sitting or turning. She reports a modest relief with tramadol and Tylenol. She remains active in terms of work ambulatory independently. She is able to drive without difficulty. She denies any radiation of her pain circumferentially or into the upper cervical or lower lumbar or thoracic regions. Her appetite remains normal.  Her weight has been steady. She denies any autonomic dysfunction of her bowel or bladder activities. She denies recent chills, fever or rigors. The patient maintains a regular working day schedule. Past Medical History: Patient has a past history of left-sided nephrectomy for carcinoma followed by chemotherapy and radiation in 2012. She is being followed on a regular basis by her urologist Dr. Alinda Money. She also sees Dr. Julien Nordmann for her lung carcinoma. History of herniated disc in the cervical region. Tubal ligation October 1972. Medications: Lipitor. Metoprolol. Aspirin 81 mg a day. Omega 3 fatty acid tablets. Vitamin D3. Flonase spray. Tylenol p.r.n. Tramadol 50 mg 3 times a day or p.r.n. Advair  tablets. Polyvinyl alcohol povidone ophthalmic solution. Restoril. Albuterol nebulizer solution for wheezing as needed. Allergies: Bee venom, amlodipine, levofloxacin, and hydrochlorothiazide. Social History: The patient has two boys and girls alive and well. Denies smoking cigarettes or using alcohol. Denies using illicit chemicals. Family History: History of asthma in her daughter. Migraine headaches in son and daughter. Daughter and mother had heart problems. High blood pressure. Mother deceased of bladder carcinoma. Father deceased of unknown causes. REVIEW OF SYSTEMS: Negative unless as mentioned above. PHYSICAL EXAMINATION: In mild acute distress on account of the pain in the scapular region. Affect, otherwise, normal.  Neurologically grossly intact. Palpation of her upper thoracic region was not performed. ASSESSMENT AND PLAN: The patient's recent MRI of the thoracic spine was reviewed. This reveals the presence of compression fractures at T3 and T4, and abnormal enlargement and enhancement of the left pedicle at T5 and at T4. Findings at T4 and T5 suspicious of metastatic disease. There is mild retropulsion at T4 with thinning of the anterior subarachnoid space. However, the thoracic cord appears normal in signal and morphology. Vertebral body augmentation for pain relief and to potentially prevent further collapse was discussed at T3 and T4. At T4 and T5, biopsies through the left pedicles at both levels could also be obtained. The procedure of vertebroplasty, the indications, the potential complications and alternatives were reviewed with the patient. Questions were answered to her satisfaction. The patient is willing to proceed with treatment at T3-T4 in terms of augmentation and biopsy at T4, and with just a biopsy at T5 as described above. We will discuss this plan with the patient's referring radiation oncologist Dr. Melina Modena. The patient leaves with good understanding and agreement with the above  management plan. Electronically Signed   By: Luanne Bras M.D.   On: 01/16/2017 15:29    ASSESSMENT AND PLAN:  This is a very pleasant 76 years old white female with a stage IIIa non-small cell lung cancer, squamous cell carcinoma completed a course of neoadjuvant systemic chemotherapy with carboplatin and paclitaxel with partial response followed by right upper lobectomy and lymph node dissection with evidence of disease metastasis to the mediastinal lymph node and close resection margin. This was treated with a course of concurrent chemoradiation with weekly carboplatin and paclitaxel. The patient also recently treated with SBRT to hypermetabolic left lower lobe pulmonary nodule under the care of Dr. Sondra Come. She is currently on observation. She is feeling fine and the recent biopsies of the suspicious bone lesions in the thoracic spines were negative for malignancy. I recommended for the patient to have repeat CT scan of the chest in the next few days. If negative I will see her back for follow-up visit in 3 months with repeat CT scan of the chest. She was advised to call immediately if she has any concerning symptoms in the interval. The patient voices understanding of current disease status and treatment options and is in agreement with the current care plan. All questions were answered. The patient knows to call the clinic with any problems, questions or concerns. We can certainly see the patient much sooner if necessary. I spent 10 minutes counseling the patient face to face. The total time spent in the appointment was 15 minutes.  Disclaimer: This note was dictated with voice recognition software. Similar sounding words can inadvertently be transcribed and may not be corrected upon review.

## 2017-02-07 ENCOUNTER — Ambulatory Visit (HOSPITAL_COMMUNITY)
Admission: RE | Admit: 2017-02-07 | Discharge: 2017-02-07 | Disposition: A | Discharge status: Home / Self Care | Payer: Medicare Other | Source: Ambulatory Visit | Attending: Internal Medicine | Admitting: Internal Medicine

## 2017-02-07 DIAGNOSIS — C3491 Malignant neoplasm of unspecified part of right bronchus or lung: Secondary | ICD-10-CM | POA: Diagnosis not present

## 2017-02-07 DIAGNOSIS — R911 Solitary pulmonary nodule: Secondary | ICD-10-CM | POA: Diagnosis not present

## 2017-02-07 DIAGNOSIS — C3490 Malignant neoplasm of unspecified part of unspecified bronchus or lung: Secondary | ICD-10-CM | POA: Diagnosis not present

## 2017-02-07 MED ORDER — IOPAMIDOL (ISOVUE-300) INJECTION 61%
INTRAVENOUS | Status: AC
  Administered 2017-02-07: 75 mL
  Filled 2017-02-07: qty 75

## 2017-02-18 ENCOUNTER — Ambulatory Visit
Admission: RE | Admit: 2017-02-18 | Discharge: 2017-02-18 | Disposition: A | Discharge status: Home / Self Care | Payer: Medicare Other | Source: Ambulatory Visit | Attending: Radiation Oncology | Admitting: Radiation Oncology

## 2017-02-18 ENCOUNTER — Encounter: Payer: Self-pay | Admitting: Radiation Oncology

## 2017-02-18 DIAGNOSIS — C3491 Malignant neoplasm of unspecified part of right bronchus or lung: Secondary | ICD-10-CM

## 2017-02-18 DIAGNOSIS — R911 Solitary pulmonary nodule: Secondary | ICD-10-CM | POA: Diagnosis not present

## 2017-02-18 DIAGNOSIS — Z7982 Long term (current) use of aspirin: Secondary | ICD-10-CM | POA: Diagnosis not present

## 2017-02-18 DIAGNOSIS — M545 Low back pain: Secondary | ICD-10-CM | POA: Diagnosis not present

## 2017-02-18 DIAGNOSIS — C3431 Malignant neoplasm of lower lobe, right bronchus or lung: Secondary | ICD-10-CM | POA: Diagnosis not present

## 2017-02-18 DIAGNOSIS — M4854XA Collapsed vertebra, not elsewhere classified, thoracic region, initial encounter for fracture: Secondary | ICD-10-CM | POA: Diagnosis not present

## 2017-02-18 DIAGNOSIS — Z85118 Personal history of other malignant neoplasm of bronchus and lung: Secondary | ICD-10-CM | POA: Diagnosis not present

## 2017-02-18 DIAGNOSIS — M546 Pain in thoracic spine: Secondary | ICD-10-CM | POA: Diagnosis not present

## 2017-02-18 DIAGNOSIS — Z923 Personal history of irradiation: Secondary | ICD-10-CM | POA: Diagnosis not present

## 2017-02-18 NOTE — Progress Notes (Signed)
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 This is a 76 y.o. female seen at the courtesy of Dr. Magnus Sinning. She has a prior history of non-small cell lung cancer stage IIIA and received radiation to the Right Central Chest (50.4 Gy). The patient also recently completed SBRT for an oligometastatic Left Lower Lobe nodule.  The patient presented on 12/26/16 with an approximately 2 month history of progressive pain in the mid-back. The patient would not be able to sleep without narcotic pain medications. Patient was seen by Dr. Ernestina Patches and an MRI was ordered. MRI on 12/21/16 showed compression fractures at T3 and T4. There are also focal osseous lesions at the junction of the vertebral body and left pedicle at the T4 and T5 levels, suggestive for osseous disease.  On 01/31/17, the patient had multiple bone biopsies and vertebroplasty by Dr. Estanislado Pandy. Bone biopsy of T3 revealed benign lamellar bone, fibrous tissue, and hematopoietic elements. Bone biopsy of T4 revealed osteopenic lamellar bone and fibrous tissue. Bone biopsy of T5 revealed scant lamellar bone, hametopoietic elements, and blood.  CT of the chest on 02/07/17 showed no evidence of lung cancer recurrence following a right upper lobectomy, near complete resolution of the left lower lobe nodule, and no evidence of metastatic  disease.  On review of systems: The patient's mid-back pain has improved and is a 5/10. She takes tramadol once a day. No weakness in her lower extremities or paresthesias.  ALLERGIES:  is allergic to bee venom; amlodipine; levofloxacin; and hctz [hydrochlorothiazide].  Meds: Current Outpatient Prescriptions  Medication Sig Dispense Refill  . acetaminophen (TYLENOL) 500 MG tablet Take 500 mg by mouth every 6 (six) hours as needed for mild pain, moderate pain, fever or headache. Reported on 05/24/2016    . albuterol (PROVENTIL HFA;VENTOLIN HFA) 108 (90 BASE) MCG/ACT inhaler Inhale 2 puffs into the lungs every 4 (four) hours as needed for wheezing or shortness of breath. 1 Inhaler 0  . albuterol (PROVENTIL) (2.5 MG/3ML) 0.083% nebulizer solution Take 2.5 mg by nebulization every 6 (six) hours as needed for wheezing or shortness of breath.    Marland Kitchen aspirin EC 81 MG tablet Take 81 mg by mouth daily. Reported on 04/11/2016    . atorvastatin (LIPITOR) 40 MG tablet TAKE ONE TABLET BY MOUTH ONCE DAILY. 90 tablet 3  . cholecalciferol (VITAMIN D) 1000 units tablet Take 1 tablet by mouth daily.    . fish oil-omega-3 fatty acids 1000 MG capsule Take 1 capsule by mouth daily.     . fluticasone (FLONASE) 50 MCG/ACT nasal spray Place 2 sprays into both nostrils daily as needed for allergies.     . Fluticasone-Salmeterol (ADVAIR) 250-50 MCG/DOSE AEPB Inhale 1 puff into the lungs 2 (two) times daily. 60 each 11  . metoprolol succinate (TOPROL-XL) 50 MG 24 hr tablet Take 1 tablet (50 mg total) by mouth daily. Take with or  immediately following a meal. 30 tablet 5  . polyvinyl alcohol-povidone (REFRESH) 1.4-0.6 % ophthalmic solution Place 2 drops into both eyes daily as needed (for dry eyes). Reported on 05/24/2016    . temazepam (RESTORIL) 15 MG capsule Take 1 capsule (15 mg total) by mouth at bedtime as needed for sleep. 30 capsule 0  . traMADol (ULTRAM) 50 MG tablet Take 1-2 tablets (50-100 mg total) by mouth every 6  (six) hours as needed for moderate pain. Reported on 05/24/2016 120 tablet 02   Current Facility-Administered Medications  Medication Dose Route Frequency Provider Last Rate Last Dose  . levalbuterol (XOPENEX) nebulizer solution 0.63 mg  0.63 mg Nebulization Once [name], PA-C        Physical Findings: The patient is in no acute distress. Patient is alert and oriented.  height is '5\' 4"'$  (1.626 m) and weight is 164 lb (74.4 kg). Her oral temperature is 99.5 F (37.5 C). Her blood pressure is 160/96 (abnormal) and her pulse is 80. Her oxygen saturation is 92%.   General: Alert and oriented, in no acute distress. HEENT: Head is normocephalic. Extraocular movements are intact. Oropharynx is clear. Neck: Neck is supple, no palpable cervical or supraclavicular lymphadenopathy. Heart: Regular in rate and rhythm with no murmurs, rubs, or gallops. Chest: Clear to auscultation bilaterally, with no rhonchi, wheezes, or rales. Neurologic: No obvious focalities. Speech is fluent. Coordination is intact. 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 Psychiatric: Judgment and insight are intact. Affect is appropriate.  Lab Findings: Lab Results  Component Value Date   WBC 6.8 01/31/2017   HGB 12.4 01/31/2017   HCT 39.4 01/31/2017   MCV 92.5 01/31/2017   PLT 322 01/31/2017    Radiographic Findings: Ct Chest W Contrast  Result Date: 02/08/2017 CLINICAL DATA:  Restaging lung cancer EXAM: CT CHEST WITH CONTRAST TECHNIQUE: Multidetector CT imaging of the chest was performed during intravenous contrast administration. CONTRAST:  57m ISOVUE-300 IOPAMIDOL (ISOVUE-300) INJECTION 61% COMPARISON:  Chest CTA 10/25/2016 PET-CT 09/2026 FINDINGS: Cardiovascular: No significant vascular findings. Normal heart size. No pericardial effusion. Mediastinum/Nodes: No axillary or supraclavicular adenopathy. No mediastinal hilar adenopathy. No pericardial fluid. Esophagus normal. Lungs/Pleura: RIGHT  upper lobectomy. No nodularity along the resection margin. No new pulmonary nodules in the LEFT or RIGHT lung. Near complete resolution of LEFT lower lobe pulmonary nodule with only mild peribronchial thickening remaining (image 12, series 5). Upper Abdomen: Adrenal glands normal. No upper abdominal adenopathy. Musculoskeletal: Compression fractures in the upper thoracic spine again noted no aggressive osseous lesion IMPRESSION: 1. No evidence of lung cancer recurrence following RIGHT upper lobectomy. 2. No evidence of metastatic disease. 3. Near complete resolution of LEFT lower lobe nodule . Electronically Signed   By: SSuzy BouchardM.D.   On: 02/08/2017 08:14   Ir Fluoro Guide Ndl Plmt / Bx  Result Date: 02/04/2017 INDICATION: Severe upper thoracic pain secondary to pathologic compression fractures at T3, T4, and abnormality of T5 vertebral body. Patient with past history of neoplasia. EXAM: VERTEBROPLASTY AND BIOPSY AT T3, VERTEBROPLASTY AND BIOPSY T4, CORE BONE BIOPSY AT T5. MEDICATIONS: As antibiotic prophylaxis, Ancef 2 g IV was ordered pre-procedure and administered intravenously within 1 hour of incision. ANESTHESIA/SEDATION: Moderate (conscious) sedation was employed during this procedure. A total of Versed 3 mg and Fentanyl 100 mcg, 2 mg Dilaudid IV was administered intravenously. Moderate Sedation Time: 50 minutes. The patient's level of consciousness and vital signs were monitored continuously by radiology nursing throughout the procedure under my  direct supervision. FLUOROSCOPY TIME:  Fluoroscopy Time: 50 minutes 30 seconds (9357 mGy) COMPLICATIONS: None immediate. TECHNIQUE: Informed written consent was obtained from the patient after a thorough discussion of the procedural risks, benefits and alternatives. All questions were addressed. Maximal Sterile Barrier Technique was utilized including caps, mask, sterile gowns, sterile gloves, sterile drape, hand hygiene and skin antiseptic. A timeout  was performed prior to the initiation of the procedure. PROCEDURE: The patient was placed prone on the fluoroscopic table. Nasal oxygen was administered. Physiologic monitoring was performed throughout the duration of the procedure. The skin overlying the thoracic region was prepped and draped in the usual sterile fashion. The T3, T4 and T5 vertebral bodies were identified and the right pedicle at T3, the left pedicle at T4, and the left pedicle at T5 were then infiltrated with 0.25% bupivacaine followed by the advancement of 13 gauge Cook spinal needles into the posterior 1/3 at the T3, T4 and T5. Through each of these needles, 16 gauge core biopsy needles were advanced in a coaxial manner through each of the levels. Using a 20 mL syringe, 2 passes were made at each level, and samples of the tissue obtained were sent for pathologic analysis. The needle at T5 was removed. Hemostasis was achieved at the skin entry site. At T3-T4, the needles were advanced to within 5 mm of the anterior aspect of T3 and T4. Venograms were performed through the needles to ensure no measure of venous structures in the vicinity prior to the installation of the methylmethacrylate mixture. At this time, methylmethacrylate mixture was reconstituted. Under biplane intermittent fluoroscopy, the methylmethacrylate was then injected into the T3 and T4 vertebral bodies with excellent filling. No extravasation was noted into the disk spaces or posteriorly into the spinal canal. No epidural venous contamination was seen. At the T4 there was minimal extrusion of the methylmethacrylate mixture from the fracture cleft along the superior endplate. The needles were then removed. Hemostasis was achieved at the skin entry sites. There were no acute complications. Patient tolerated the procedure well. The patient was observed for 3 hours and discharged in good condition under the care of her son. IMPRESSION: Status post vertebral body augmentation for  painful compression fracture at T3 and T4 using vertebroplasty technique. Deep core bone biopsy at T5 as above. The patient and her son were given instructions regarding postprocedural management. Also they were informed to call her oncologist regarding the results of the biopsies. Questions were answered to their satisfaction. They both leave with good understanding and agreement with the above management plan. Electronically Signed   By: Luanne Bras M.D.   On: 02/01/2017 10:43   Ir Vertebroplasty Cerv/thor Bx Inc Uni/bil Inc/inject/imaging  Result Date: 02/04/2017 INDICATION: Severe upper thoracic pain secondary to pathologic compression fractures at T3, T4, and abnormality of T5 vertebral body. Patient with past history of neoplasia. EXAM: VERTEBROPLASTY AND BIOPSY AT T3, VERTEBROPLASTY AND BIOPSY T4, CORE BONE BIOPSY AT T5. MEDICATIONS: As antibiotic prophylaxis, Ancef 2 g IV was ordered pre-procedure and administered intravenously within 1 hour of incision. ANESTHESIA/SEDATION: Moderate (conscious) sedation was employed during this procedure. A total of Versed 3 mg and Fentanyl 100 mcg, 2 mg Dilaudid IV was administered intravenously. Moderate Sedation Time: 50 minutes. The patient's level of consciousness and vital signs were monitored continuously by radiology nursing throughout the procedure under my direct supervision. FLUOROSCOPY TIME:  Fluoroscopy Time: 50 minutes 30 seconds (0177 mGy) COMPLICATIONS: None immediate. TECHNIQUE: Informed written consent was obtained from the patient  after a thorough discussion of the procedural risks, benefits and alternatives. All questions were addressed. Maximal Sterile Barrier Technique was utilized including caps, mask, sterile gowns, sterile gloves, sterile drape, hand hygiene and skin antiseptic. A timeout was performed prior to the initiation of the procedure. PROCEDURE: The patient was placed prone on the fluoroscopic table. Nasal oxygen was  administered. Physiologic monitoring was performed throughout the duration of the procedure. The skin overlying the thoracic region was prepped and draped in the usual sterile fashion. The T3, T4 and T5 vertebral bodies were identified and the right pedicle at T3, the left pedicle at T4, and the left pedicle at T5 were then infiltrated with 0.25% bupivacaine followed by the advancement of 13 gauge Cook spinal needles into the posterior 1/3 at the T3, T4 and T5. Through each of these needles, 16 gauge core biopsy needles were advanced in a coaxial manner through each of the levels. Using a 20 mL syringe, 2 passes were made at each level, and samples of the tissue obtained were sent for pathologic analysis. The needle at T5 was removed. Hemostasis was achieved at the skin entry site. At T3-T4, the needles were advanced to within 5 mm of the anterior aspect of T3 and T4. Venograms were performed through the needles to ensure no measure of venous structures in the vicinity prior to the installation of the methylmethacrylate mixture. At this time, methylmethacrylate mixture was reconstituted. Under biplane intermittent fluoroscopy, the methylmethacrylate was then injected into the T3 and T4 vertebral bodies with excellent filling. No extravasation was noted into the disk spaces or posteriorly into the spinal canal. No epidural venous contamination was seen. At the T4 there was minimal extrusion of the methylmethacrylate mixture from the fracture cleft along the superior endplate. The needles were then removed. Hemostasis was achieved at the skin entry sites. There were no acute complications. Patient tolerated the procedure well. The patient was observed for 3 hours and discharged in good condition under the care of her son. IMPRESSION: Status post vertebral body augmentation for painful compression fracture at T3 and T4 using vertebroplasty technique. Deep core bone biopsy at T5 as above. The patient and her son were  given instructions regarding postprocedural management. Also they were informed to call her oncologist regarding the results of the biopsies. Questions were answered to their satisfaction. They both leave with good understanding and agreement with the above management plan. Electronically Signed   By: Luanne Bras M.D.   On: 02/01/2017 10:43   Ir Vertebroplasty Ea Addl (t&ls) Bx Inc Uni/bil Inc Inject/imaging  Result Date: 02/04/2017 INDICATION: Severe upper thoracic pain secondary to pathologic compression fractures at T3, T4, and abnormality of T5 vertebral body. Patient with past history of neoplasia. EXAM: VERTEBROPLASTY AND BIOPSY AT T3, VERTEBROPLASTY AND BIOPSY T4, CORE BONE BIOPSY AT T5. MEDICATIONS: As antibiotic prophylaxis, Ancef 2 g IV was ordered pre-procedure and administered intravenously within 1 hour of incision. ANESTHESIA/SEDATION: Moderate (conscious) sedation was employed during this procedure. A total of Versed 3 mg and Fentanyl 100 mcg, 2 mg Dilaudid IV was administered intravenously. Moderate Sedation Time: 50 minutes. The patient's level of consciousness and vital signs were monitored continuously by radiology nursing throughout the procedure under my direct supervision. FLUOROSCOPY TIME:  Fluoroscopy Time: 50 minutes 30 seconds (7829 mGy) COMPLICATIONS: None immediate. TECHNIQUE: Informed written consent was obtained from the patient after a thorough discussion of the procedural risks, benefits and alternatives. All questions were addressed. Maximal Sterile Barrier Technique was utilized including  caps, mask, sterile gowns, sterile gloves, sterile drape, hand hygiene and skin antiseptic. A timeout was performed prior to the initiation of the procedure. PROCEDURE: The patient was placed prone on the fluoroscopic table. Nasal oxygen was administered. Physiologic monitoring was performed throughout the duration of the procedure. The skin overlying the thoracic region was prepped and  draped in the usual sterile fashion. The T3, T4 and T5 vertebral bodies were identified and the right pedicle at T3, the left pedicle at T4, and the left pedicle at T5 were then infiltrated with 0.25% bupivacaine followed by the advancement of 13 gauge Cook spinal needles into the posterior 1/3 at the T3, T4 and T5. Through each of these needles, 16 gauge core biopsy needles were advanced in a coaxial manner through each of the levels. Using a 20 mL syringe, 2 passes were made at each level, and samples of the tissue obtained were sent for pathologic analysis. The needle at T5 was removed. Hemostasis was achieved at the skin entry site. At T3-T4, the needles were advanced to within 5 mm of the anterior aspect of T3 and T4. Venograms were performed through the needles to ensure no measure of venous structures in the vicinity prior to the installation of the methylmethacrylate mixture. At this time, methylmethacrylate mixture was reconstituted. Under biplane intermittent fluoroscopy, the methylmethacrylate was then injected into the T3 and T4 vertebral bodies with excellent filling. No extravasation was noted into the disk spaces or posteriorly into the spinal canal. No epidural venous contamination was seen. At the T4 there was minimal extrusion of the methylmethacrylate mixture from the fracture cleft along the superior endplate. The needles were then removed. Hemostasis was achieved at the skin entry sites. There were no acute complications. Patient tolerated the procedure well. The patient was observed for 3 hours and discharged in good condition under the care of her son. IMPRESSION: Status post vertebral body augmentation for painful compression fracture at T3 and T4 using vertebroplasty technique. Deep core bone biopsy at T5 as above. The patient and her son were given instructions regarding postprocedural management. Also they were informed to call her oncologist regarding the results of the biopsies.  Questions were answered to their satisfaction. They both leave with good understanding and agreement with the above management plan. Electronically Signed   By: Luanne Bras M.D.   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 The patient's back pain has improved after vertebroplasty, but has not totally went away and is  tolerable. Given these findings, I do not  radiotherapy to the thoracic spine.  Plan: The patient will continue follow up with Dr. Julien Nordmann and CT scans with him every 3 months. She will follow up with radiation oncology on a PRN basis. -----------------------------------  Blair Promise, PhD, MD  This document serves as a record of services personally performed by Gery Pray, MD. It was created on his behalf by Darcus Austin, a trained medical scribe. The creation of this record is based on the scribe's personal observations and the provider's statements to them. This document has been checked and approved by the attending provider.

## 2017-02-18 NOTE — Progress Notes (Signed)
Please see the Nurse Progress Note in the MD Initial Consult Encounter for this patient. 

## 2017-02-18 NOTE — Progress Notes (Signed)
Histology and Location of Primary Cancer: Stage IIIA (T2a, N2, M0) non-small cell lung cancer, squamous cell carcinoma presented with large right upper lobe lung mass with right hilar involvement   Location(s) of Symptomatic Metastases:T3, T4 and T5   Biopsies revealed:  01/31/17  Diagnosis 1. Bone, biopsy, T3 - BENIGN LAMELLAR BONE, FIBROUS TISSUE, AND HEMATOPOIETIC ELEMENTS. - THERE IS NO EVIDENCE OF MALIGNANCY. 2. Bone, biopsy, T4 - OSTEOPENIC LAMELLAR BONE AND FIBROUS TISSUE. - THERE IS NO EVIDENCE OF MALIGNANCY. 3. Bone, biopsy, T5 - SCANT LAMELLAR BONE AND HEMATOPOIETIC ELEMENTS. - BLOOD. - THERE IS NO EVIDENCE OF MALIGNANCY.  Pain on a scale of 0-10 is: 5 in her mid back.  She takes tramadol once a day.   If Spine Met(s), symptoms, if any, include:  Bowel/Bladder retention or incontinence (please describe): no  Numbness or weakness in extremities (please describe): no  Current Decadron regimen, if applicable: n/a  Ambulatory status? Walker? Wheelchair?: Ambulatory  SAFETY ISSUES:  Prior radiation? 02/29/16-04/10/16 50.4 Gy to right central chest, 10/30/16-11/06/16 Left lower lung/ 54 Gy in 3 fractions  Pacemaker/ICD? no  Possible current pregnancy? no  Is the patient on methotrexate? no  Current Complaints / other details:  vertebroplasty was done on 01/31/17.  She reports having wheezing and coughing with white sputum that started yesterday.  Her temperature today was 99.5.    BP (!) 160/96 (BP Location: Left Arm, Patient Position: Sitting)   Pulse 80   Temp 99.5 F (37.5 C) (Oral)   Ht _0  (1.626 m)   Wt 164 lb (74.4 kg)   SpO2 92%   BMI 28.15 kg/m    Wt Readings from Last 3 Encounters:  02/18/17 164 lb (74.4 kg)  02/05/17 164 lb 11.2 oz (74.7 kg)  01/31/17 165 lb (74.8 kg)

## 2017-03-05 ENCOUNTER — Ambulatory Visit (INDEPENDENT_AMBULATORY_CARE_PROVIDER_SITE_OTHER): Payer: Medicare Other | Admitting: Family Medicine

## 2017-03-05 ENCOUNTER — Encounter: Payer: Self-pay | Admitting: Family Medicine

## 2017-03-05 VITALS — BP 125/66 | HR 88 | Temp 99.3°F | Resp 16 | Ht 64.0 in | Wt 163.8 lb

## 2017-03-05 DIAGNOSIS — T451X5A Adverse effect of antineoplastic and immunosuppressive drugs, initial encounter: Secondary | ICD-10-CM

## 2017-03-05 DIAGNOSIS — E7800 Pure hypercholesterolemia, unspecified: Secondary | ICD-10-CM

## 2017-03-05 DIAGNOSIS — C3491 Malignant neoplasm of unspecified part of right bronchus or lung: Secondary | ICD-10-CM

## 2017-03-05 DIAGNOSIS — I10 Essential (primary) hypertension: Secondary | ICD-10-CM

## 2017-03-05 DIAGNOSIS — G620 Drug-induced polyneuropathy: Secondary | ICD-10-CM | POA: Diagnosis not present

## 2017-03-05 DIAGNOSIS — J22 Unspecified acute lower respiratory infection: Secondary | ICD-10-CM | POA: Diagnosis not present

## 2017-03-05 DIAGNOSIS — J449 Chronic obstructive pulmonary disease, unspecified: Secondary | ICD-10-CM | POA: Diagnosis not present

## 2017-03-05 DIAGNOSIS — Z8781 Personal history of (healed) traumatic fracture: Secondary | ICD-10-CM

## 2017-03-05 MED ORDER — AZELASTINE HCL 0.1 % NA SOLN: 2.0000 | Freq: Two times a day (BID) | NASAL | 12 refills | Status: DC

## 2017-03-05 MED ORDER — DOXYCYCLINE HYCLATE 100 MG PO CAPS: 100.0000 mg | ORAL_CAPSULE | Freq: Two times a day (BID) | ORAL | 0 refills | Status: DC

## 2017-03-05 MED ORDER — ALENDRONATE SODIUM 70 MG PO TABS: 70.0000 mg | ORAL_TABLET | ORAL | 11 refills | Status: DC

## 2017-03-05 NOTE — Patient Instructions (Addendum)
IF you received an x-ray today, you will receive an invoice from [facility]. Please contact [facility] at [phone] with questions or concerns regarding your invoice.   IF you received labwork today, you will receive an invoice from [place]. Please contact [facility] at [phone] with questions or concerns regarding your invoice.   Our billing staff will not be able to assist you with questions regarding bills from these companies.  You will be contacted with the lab results as soon as they are available. The fastest way to get your results is to activate your My Chart account. Instructions are located on the last page of this paperwork. If you have not heard from [place] regarding the results in 2 weeks, please contact this office.      Osteoporosis Osteoporosis is the thinning and loss of density in the bones. Osteoporosis makes the bones more brittle, fragile, and likely to break (fracture). Over time, osteoporosis can cause the bones to become so weak that they fracture after a simple fall. The bones most likely to fracture are the bones in the hip, wrist, and spine. What are the causes? The exact cause is not known. What increases the risk? Anyone can develop osteoporosis. You may be at greater risk if you have a family history of the condition or have poor nutrition. You may also have a higher risk if you are:  Female.  74 years old or older.  A smoker.  Not physically active.  White or Asian.  Slender. What are the signs or symptoms? A fracture might be the first sign of the disease, especially if it results from a fall or injury that would not usually cause a bone to break. Other signs and symptoms include:  Low back and neck pain.  Stooped posture.  Height loss. How is this diagnosed? To make a diagnosis, your health care provider may:  Take a medical history.  Perform a physical exam.  Order tests, such as:  A bone mineral density  test.  A dual-energy X-ray absorptiometry test. How is this treated? The goal of osteoporosis treatment is to strengthen your bones to reduce your risk of a fracture. Treatment may involve:  Making lifestyle changes, such as:  Eating a diet rich in calcium.  Doing weight-bearing and muscle-strengthening exercises.  Stopping tobacco use.  Limiting alcohol intake.  Taking medicine to slow the process of bone loss or to increase bone density.  Monitoring your levels of calcium and vitamin D. Follow these instructions at home:  Include calcium and vitamin D in your diet. Calcium is important for bone health, and vitamin D helps the body absorb calcium.  Perform weight-bearing and muscle-strengthening exercises as directed by your health care provider.  Do not use any tobacco products, including cigarettes, chewing tobacco, and electronic cigarettes. If you need help quitting, ask your health care provider.  Limit your alcohol intake.  Take medicines only as directed by your health care provider.  Keep all follow-up visits as directed by your health care provider. This is important.  Take precautions at home to lower your risk of falling, such as:  Keeping rooms well lit and clutter free.  Installing safety rails on stairs.  Using rubber mats in the bathroom and other areas that are often wet or slippery. Get help right away if: You fall or injure yourself. This information is not intended to replace advice given to you by your health care provider. Make sure you discuss any questions you  have with your health care provider. Document Released: 08/08/2005 Document Revised: 04/02/2016 Document Reviewed: 04/08/2014 Elsevier Interactive Patient Education  2017 [employer].

## 2017-03-05 NOTE — Progress Notes (Signed)
Subjective:    Patient ID: Tammy Ball, female    DOB: 22-Nov-1940, 76 y.o.   MRN: [id number]  03/05/2017  Follow-up (on bp and cholesterol) and Cough (wheezing, low grade fever, light white to yellow phlegm x 1.5 weeks)   HPI This 76 y.o. female presents for three month follow-up for hypertension and hypercholesterolemia.  Compression fracture thoracic:  s/p kyphoplasty in March; horrible experience; very painful.  No cancer; s/p bx.  No cancer in spine; very reassuring.  Still has pain; advised would for two weeks but still having pain; much less pain.  Conscious sedation; heard everything said; heard everything occurring.  Most painful experience.  Vomited for two days.  Drinks milk every day; eats yogurt daily; eats cheese daily.   Long term steroids.  Has Tums.    Lung cancer:  s/p repeat CT chest on 02/07/17; no evidence of lung cancer. Followed by oncology every 90 days; no treatments now.  Last treatment radiation in 10/2016 lower LEFT lung.  Coughing and wheezing: onset two weeks ago.  Low grade fever for two weeks.  Unable to escalate into anything.  Tmax 99.7.  +night sweats.  No headache.  No ear pain or sore throat.  +rhinorrhea clear.  +coughing; sputum is thick and white to light yellow.  +SOB.  Using Albuterol if wheezing or coughing; not using daily.  No v/d.  Using Advair; using Flonase but does not feel like working anymore.  No oral antihistamine.  Has taken Claritin in the past yet not effective.  Sneezing a lot.  Wheezing mostly at night.  Immunization History  Administered Date(s) Administered  . Hepatitis B 11/12/1998  . Influenza Split 09/12/2012  . Influenza,inj,Quad PF,36+ Mos 07/18/2013  . Influenza-Unspecified 07/29/2014, 07/23/2015, 08/25/2016  . Pneumococcal Conjugate-13 07/29/2015  . Pneumococcal Polysaccharide-23 11/12/2000, 10/23/2012  . Pneumococcal-Unspecified 10/23/2012  . Tdap 12/02/2013  . Zoster 12/13/2010   BP Readings from Last 3 Encounters:    03/05/17 125/66  02/18/17 (!) 160/96  02/05/17 (!) 147/75   Wt Readings from Last 3 Encounters:  03/05/17 163 lb 12.8 oz (74.3 kg)  02/18/17 164 lb (74.4 kg)  02/05/17 164 lb 11.2 oz (74.7 kg)    Review of Systems  Constitutional: Positive for fatigue and fever. Negative for chills and diaphoresis.  HENT: Positive for congestion, postnasal drip, rhinorrhea, sinus pain, sinus pressure and sneezing. Negative for ear pain and sore throat.   Eyes: Negative for visual disturbance.  Respiratory: Positive for cough and wheezing. Negative for shortness of breath.   Cardiovascular: Negative for chest pain, palpitations and leg swelling.  Gastrointestinal: Negative for abdominal pain, constipation, diarrhea, nausea and vomiting.  Endocrine: Negative for cold intolerance, heat intolerance, polydipsia, polyphagia and polyuria.  Musculoskeletal: Positive for back pain.  Neurological: Negative for dizziness, tremors, seizures, syncope, facial asymmetry, speech difficulty, weakness, light-headedness, numbness and headaches.    Past Medical History:  Diagnosis Date  . Anemia    was while doing chemo  . Anxiety   . Asthma   . Chemotherapy-induced neuropathy ([place]) 11/01/2015  . Claustrophobia   . COPD (chronic obstructive pulmonary disease) ([place])   . Depression   . Encounter for antineoplastic chemotherapy 02/09/2016  . Glaucoma   . Headache    prior to menopause  . Heart murmur   . History of echocardiogram    Echo 2/17: EF 82-95%, grade 1 diastolic dysfunction, mild MR, trivial pericardial effusion  . History of nuclear stress test    Myoview 2/17: no  ischemia or scar, EF 79%; low risk  . History of radiation therapy 10/30/16-11/06/16  . Hyperlipidemia   . Hypertension   . Insomnia 05/16/2016  . Non-small cell carcinoma of right lung, stage 2 ([place]) 10/03/2015  . Radiation 02/29/16-04/10/16   50.4 Gy to right central chest  . Renal cell carcinoma ([place])    L nephrectomy  in 2012   Past  Surgical History:  Procedure Laterality Date  . BACK SURGERY     cervical 1991  . EYE SURGERY    . IR GENERIC HISTORICAL  01/16/2017   IR RADIOLOGIST EVAL & MGMT 01/16/2017 [facility]  . IR GENERIC HISTORICAL  01/31/2017   IR FLUORO GUIDED NEEDLE PLC ASPIRATION/INJECTION LOC 01/31/2017 Luanne Bras, MD [facility]  . IR GENERIC HISTORICAL  01/31/2017   IR VERTEBROPLASTY CERV/THOR BX INC UNI/BIL INC/INJECT/IMAGING 01/31/2017 Luanne Bras, MD [facility]  . IR GENERIC HISTORICAL  01/31/2017   IR VERTEBROPLASTY EA ADDL (T&LS) BX INC UNI/BIL INC INJECT/IMAGING 01/31/2017 Luanne Bras, MD [facility]  . kidney cancer    . NEPHRECTOMY    . SPINE SURGERY    . TUBAL LIGATION    . VIDEO ASSISTED THORACOSCOPY (VATS)/WEDGE RESECTION Right 01/18/2016   Procedure: VIDEO ASSISTED THORACOSCOPY (VATS)/LUNG RESECTION, THOROCOTOMY, RIGHT UPPER LOBECTOMY, LYMPH NODE DISSECTION, PLACEMENT OF ON Q;  Surgeon: [name], MD;  Location: [place];  Service: Thoracic;  Laterality: Right;  Marland Kitchen VIDEO BRONCHOSCOPY Bilateral 09/20/2015   Procedure: VIDEO BRONCHOSCOPY WITHOUT FLUORO;  Surgeon: Rigoberto Noel, MD;  Location: [facility];  Service: Cardiopulmonary;  Laterality: Bilateral;  . VIDEO BRONCHOSCOPY N/A 01/18/2016   Procedure: VIDEO BRONCHOSCOPY;  Surgeon: [name], MD;  Location: [facility] OR;  Service: Thoracic;  Laterality: N/A;   Allergies  Allergen Reactions  . Bee Venom Anaphylaxis, Shortness Of Breath, Swelling and Other (See Comments)    Swelling at site   . Amlodipine Swelling and Other (See Comments)    Swelling of the ankles and hands   . Levofloxacin Other (See Comments)    Joint pain   . Hctz [Hydrochlorothiazide] Palpitations and Other (See Comments)    Sweating     Social History   Social History  . Marital status: Widowed    Spouse name: N/A  . Number of children: N/A  . Years of education: N/A   Occupational History  . Not on file.   Social History Main Topics    . Smoking status: Former Smoker    Packs/day: 0.50    Years: 50.00    Types: Cigarettes    Quit date: 03/16/2011  . Smokeless tobacco: Never Used  . Alcohol use No  . Drug use: No  . Sexual activity: Not Currently   Other Topics Concern  . Not on file   Social History Narrative   Marital status: divorced; not dating.      Children: 4 children; 3 grandchildren adult; 4 gg.      Lives: alone in house      Employment: full time substance abuse counselor; [employer].      Tobacco: quit smoking 2012; smoked 45 years      Alcohol: none      Drugs: none      ADLs: independent with ADLs; drives.       Advanced Directives: YES: HCPOA: Nicholas Martinez/son.  FULL CODE but no prolonged measures.      Occupation: Substance Abuse Estate agent   No exercise** Merged  History Encounter **       ** Data from: 12/14/11 Enc Dept: [facility]       ** Data from: 12/17/11 Enc Dept: [facility]   Substance abuse counselor   Husband deceased   4 great grandchildren   Son works in same substance abuse counseling center as patient   Family History  Problem Relation Age of Onset  . Heart disease Sister   . Obesity Brother   . Heart attack Daughter 14       s/p CABG  . Glaucoma Daughter   . Breast cancer Sister   . Birth defects Sister   . Cancer Mother        Bladder Cancer  . Hypertension Mother   . CAD Mother 6  . Cancer Maternal Grandmother        Objective:    BP 125/66 (BP Location: Right Arm, Patient Position: Sitting, Cuff Size: Small)   Pulse 88   Temp 99.3 F (37.4 C) (Oral)   Resp 16   Ht '5\' 4"'$  (1.626 m)   Wt 163 lb 12.8 oz (74.3 kg)   SpO2 98%   BMI 28.12 kg/m  Physical Exam  Constitutional: She is oriented to person, place, and time. She appears well-developed and well-nourished. No distress.  HENT:  Head: Normocephalic and atraumatic.  Right Ear: External ear normal.  Left Ear: External ear normal.  Nose:  Nose normal.  Mouth/Throat: Oropharynx is clear and moist.  Eyes: Conjunctivae and EOM are normal. Pupils are equal, round, and reactive to light.  Neck: Normal range of motion. Neck supple. Carotid bruit is not present. No thyromegaly present.  Cardiovascular: Normal rate, regular rhythm, normal heart sounds and intact distal pulses.  Exam reveals no gallop and no friction rub.   No murmur heard. Pulmonary/Chest: Effort normal and breath sounds normal. She has no wheezes. She has no rales.  Abdominal: Soft. Bowel sounds are normal. She exhibits no distension and no mass. There is no tenderness. There is no rebound and no guarding.  Lymphadenopathy:    She has no cervical adenopathy.  Neurological: She is alert and oriented to person, place, and time. No cranial nerve deficit.  Skin: Skin is warm and dry. No rash noted. She is not diaphoretic. No erythema. No pallor.  Psychiatric: She has a normal mood and affect. Her behavior is normal. Judgment and thought content normal.   Depression screen [facility] 2/9 03/05/2017 02/18/2017 12/26/2016 12/08/2016 11/14/2016  Decreased Interest 0 0 0 0 0  Down, Depressed, Hopeless 0 0 0 1 0  PHQ - 2 Score 0 0 0 1 0  Some recent data might be hidden   Fall Risk  03/05/2017 02/18/2017 12/26/2016 12/08/2016 11/14/2016  Falls in the past year? No No No No No        Assessment & Plan:   1. Essential hypertension   2. Lower respiratory infection   3. COPD GOLD II    4. Non-small cell carcinoma of right lung, stage 2 ([place])   5. Chemotherapy-induced neuropathy ([place])   6. History of compression fracture of spine   7. Pure hypercholesterolemia    -new onset lower respiratory infection with COPD and lung cancer; treat with Doxycycline and Astelin nasal spray. -rx for Fosamax provided for osteoporosis considering compression fracture of thoracic spine.   Orders Placed This Encounter  Procedures  . CBC with Differential/Platelet  . Comprehensive metabolic panel     Order Specific Question:  Has the patient fasted?    Answer:   Yes  . Lipid panel    Order Specific Question:   Has the patient fasted?    Answer:   Yes   Meds ordered this encounter  Medications  . alendronate (FOSAMAX) 70 MG tablet    Sig: Take 1 tablet (70 mg total) by mouth every 7 (seven) days. Take with a full glass of water on an empty stomach.    Dispense:  4 tablet    Refill:  11  . doxycycline (VIBRAMYCIN) 100 MG capsule    Sig: Take 1 capsule (100 mg total) by mouth 2 (two) times daily.    Dispense:  20 capsule    Refill:  0  . azelastine (ASTELIN) 0.1 % nasal spray    Sig: Place 2 sprays into both nostrils 2 (two) times daily. Use in each nostril as directed    Dispense:  30 mL    Refill:  12    Return in about 6 months (around 09/04/2017) for complete physical examiniation.   Helen Cuff Elayne Guerin, M.D. Primary Care at [facility] [place] [address] [place], [place]  [phone] phone [phone] fax

## 2017-03-06 LAB — CBC WITH DIFFERENTIAL/PLATELET
Basophils Absolute: 0 10*3/uL (ref 0.0–0.2)
Basos: 1 %
EOS (ABSOLUTE): 0.1 10*3/uL (ref 0.0–0.4)
Eos: 2 %
Hematocrit: 40.3 % (ref 34.0–46.6)
Hemoglobin: 12.6 g/dL (ref 11.1–15.9)
Immature Grans (Abs): 0 10*3/uL (ref 0.0–0.1)
Immature Granulocytes: 0 %
Lymphocytes Absolute: 1.3 10*3/uL (ref 0.7–3.1)
Lymphs: 21 %
MCH: 29.3 pg (ref 26.6–33.0)
MCHC: 31.3 g/dL — ABNORMAL LOW (ref 31.5–35.7)
MCV: 94 fL (ref 79–97)
Monocytes Absolute: 0.6 10*3/uL (ref 0.1–0.9)
Monocytes: 10 %
Neutrophils Absolute: 4.1 10*3/uL (ref 1.4–7.0)
Neutrophils: 66 %
Platelets: 311 10*3/uL (ref 150–379)
RBC: 4.3 x10E6/uL (ref 3.77–5.28)
RDW: 18.1 % — ABNORMAL HIGH (ref 12.3–15.4)
WBC: 6.1 10*3/uL (ref 3.4–10.8)

## 2017-03-06 LAB — COMPREHENSIVE METABOLIC PANEL
ALT: 14 IU/L (ref 0–32)
AST: 19 IU/L (ref 0–40)
Albumin/Globulin Ratio: 1.8 (ref 1.2–2.2)
Albumin: 4.5 g/dL (ref 3.5–4.8)
Alkaline Phosphatase: 103 IU/L (ref 39–117)
BUN/Creatinine Ratio: 21 (ref 12–28)
BUN: 24 mg/dL (ref 8–27)
Bilirubin Total: 0.3 mg/dL (ref 0.0–1.2)
CO2: 26 mmol/L (ref 18–29)
Calcium: 10 mg/dL (ref 8.7–10.3)
Chloride: 101 mmol/L (ref 96–106)
Creatinine, Ser: 1.12 mg/dL — ABNORMAL HIGH (ref 0.57–1.00)
GFR calc Af Amer: 56 mL/min/{1.73_m2} — ABNORMAL LOW (ref >59)
GFR calc non Af Amer: 49 mL/min/{1.73_m2} — ABNORMAL LOW (ref >59)
Globulin, Total: 2.5 g/dL (ref 1.5–4.5)
Glucose: 96 mg/dL (ref 65–99)
Potassium: 4.8 mmol/L (ref 3.5–5.2)
Sodium: 142 mmol/L (ref 134–144)
Total Protein: 7 g/dL (ref 6.0–8.5)

## 2017-03-06 LAB — LIPID PANEL
Chol/HDL Ratio: 2.1 ratio (ref 0.0–4.4)
Cholesterol, Total: 162 mg/dL (ref 100–199)
HDL: 77 mg/dL (ref >39)
LDL Calculated: 66 mg/dL (ref 0–99)
Triglycerides: 95 mg/dL (ref 0–149)
VLDL Cholesterol Cal: 19 mg/dL (ref 5–40)

## 2017-03-19 ENCOUNTER — Telehealth (HOSPITAL_COMMUNITY): Payer: Self-pay

## 2017-03-19 NOTE — Telephone Encounter (Signed)
Called to schedule f/u, no answer. AW

## 2017-04-01 ENCOUNTER — Other Ambulatory Visit: Payer: Self-pay | Admitting: Family Medicine

## 2017-05-06 ENCOUNTER — Ambulatory Visit (HOSPITAL_COMMUNITY)
Admission: RE | Admit: 2017-05-06 | Discharge: 2017-05-06 | Disposition: A | Discharge status: Home / Self Care | Payer: Medicare Other | Source: Ambulatory Visit | Attending: Internal Medicine | Admitting: Internal Medicine

## 2017-05-06 ENCOUNTER — Other Ambulatory Visit (HOSPITAL_BASED_OUTPATIENT_CLINIC_OR_DEPARTMENT_OTHER): Payer: Medicare Other

## 2017-05-06 ENCOUNTER — Telehealth: Payer: Self-pay | Admitting: *Deleted

## 2017-05-06 DIAGNOSIS — C3411 Malignant neoplasm of upper lobe, right bronchus or lung: Secondary | ICD-10-CM

## 2017-05-06 DIAGNOSIS — Z9889 Other specified postprocedural states: Secondary | ICD-10-CM | POA: Insufficient documentation

## 2017-05-06 DIAGNOSIS — I2510 Atherosclerotic heart disease of native coronary artery without angina pectoris: Secondary | ICD-10-CM | POA: Diagnosis not present

## 2017-05-06 DIAGNOSIS — Z9221 Personal history of antineoplastic chemotherapy: Secondary | ICD-10-CM | POA: Diagnosis not present

## 2017-05-06 DIAGNOSIS — C771 Secondary and unspecified malignant neoplasm of intrathoracic lymph nodes: Secondary | ICD-10-CM | POA: Diagnosis not present

## 2017-05-06 DIAGNOSIS — J439 Emphysema, unspecified: Secondary | ICD-10-CM | POA: Insufficient documentation

## 2017-05-06 DIAGNOSIS — I700 Atherosclerosis of aorta: Secondary | ICD-10-CM | POA: Diagnosis not present

## 2017-05-06 DIAGNOSIS — M4854XA Collapsed vertebra, not elsewhere classified, thoracic region, initial encounter for fracture: Secondary | ICD-10-CM | POA: Diagnosis not present

## 2017-05-06 DIAGNOSIS — J701 Chronic and other pulmonary manifestations due to radiation: Secondary | ICD-10-CM | POA: Diagnosis not present

## 2017-05-06 DIAGNOSIS — Z902 Acquired absence of lung [part of]: Secondary | ICD-10-CM | POA: Insufficient documentation

## 2017-05-06 DIAGNOSIS — C3491 Malignant neoplasm of unspecified part of right bronchus or lung: Secondary | ICD-10-CM

## 2017-05-06 DIAGNOSIS — S22050A Wedge compression fracture of T5-T6 vertebra, initial encounter for closed fracture: Secondary | ICD-10-CM | POA: Diagnosis not present

## 2017-05-06 DIAGNOSIS — Z85118 Personal history of other malignant neoplasm of bronchus and lung: Secondary | ICD-10-CM | POA: Diagnosis not present

## 2017-05-06 LAB — CBC WITH DIFFERENTIAL/PLATELET
BASO%: 1.1 % (ref 0.0–2.0)
Basophils Absolute: 0.1 10*3/uL (ref 0.0–0.1)
EOS%: 2 % (ref 0.0–7.0)
Eosinophils Absolute: 0.1 10*3/uL (ref 0.0–0.5)
HCT: 39.7 % (ref 34.8–46.6)
HGB: 12.9 g/dL (ref 11.6–15.9)
LYMPH%: 18.5 % (ref 14.0–49.7)
MCH: 30.5 pg (ref 25.1–34.0)
MCHC: 32.5 g/dL (ref 31.5–36.0)
MCV: 93.6 fL (ref 79.5–101.0)
MONO#: 0.7 10*3/uL (ref 0.1–0.9)
MONO%: 10.9 % (ref 0.0–14.0)
NEUT#: 4.1 10*3/uL (ref 1.5–6.5)
NEUT%: 67.5 % (ref 38.4–76.8)
Platelets: 274 10*3/uL (ref 145–400)
RBC: 4.25 10*6/uL (ref 3.70–5.45)
RDW: 17.4 % — ABNORMAL HIGH (ref 11.2–14.5)
WBC: 6 10*3/uL (ref 3.9–10.3)
lymph#: 1.1 10*3/uL (ref 0.9–3.3)

## 2017-05-06 LAB — COMPREHENSIVE METABOLIC PANEL
ALT: 14 U/L (ref 0–55)
AST: 18 U/L (ref 5–34)
Albumin: 3.9 g/dL (ref 3.5–5.0)
Alkaline Phosphatase: 82 U/L (ref 40–150)
Anion Gap: 10 mEq/L (ref 3–11)
BUN: 20.2 mg/dL (ref 7.0–26.0)
CO2: 26 mEq/L (ref 22–29)
Calcium: 10 mg/dL (ref 8.4–10.4)
Chloride: 105 mEq/L (ref 98–109)
Creatinine: 1.1 mg/dL (ref 0.6–1.1)
EGFR: 52 mL/min/{1.73_m2} — ABNORMAL LOW (ref >90)
Glucose: 104 mg/dl (ref 70–140)
Potassium: 5 mEq/L (ref 3.5–5.1)
Sodium: 142 mEq/L (ref 136–145)
Total Bilirubin: 0.49 mg/dL (ref 0.20–1.20)
Total Protein: 7.2 g/dL (ref 6.4–8.3)

## 2017-05-06 MED ORDER — IOPAMIDOL (ISOVUE-300) INJECTION 61%
INTRAVENOUS | Status: AC
  Filled 2017-05-06: qty 75

## 2017-05-06 MED ORDER — IOPAMIDOL (ISOVUE-300) INJECTION 61%
75.0000 mL | Freq: Once | INTRAVENOUS | Status: AC | PRN
  Administered 2017-05-06: 75 mL via INTRAVENOUS

## 2017-05-06 NOTE — Telephone Encounter (Signed)
[facility] radiology Call report for today's CT Chest.  Dr. Julien Nordmann needs to be notified if impression number 4 which is new.  Thanks.  IMPRESSION: 1. No evidence of local tumor recurrence in the right lung status post right upper lobectomy. Stable mild radiation fibrosis in the parahilar right lung. 2. No evidence of recurrent pulmonary nodule in the basilar left lower lobe, with evolving postradiation changes in this location. 3. No evidence of metastatic disease in the chest. 4. Mild T6 vertebral compression fracture, new since 02/07/2017, indicating acute or subacute age. Stable post vertebroplasty changes at the T3 and T4 chronic vertebral compression fractures. 5. Coronary atherosclerosis.  Aortic Atherosclerosis (ICD10-I70.0) and Emphysema (ICD10-J43.9).  These results will be called to the ordering clinician or representative by the Radiologist Assistant, and communication documented in the PACS or zVision Dashboard.   Electronically Signed   By: Ilona Sorrel M.D.   On: 05/06/2017 13:38

## 2017-05-09 ENCOUNTER — Telehealth: Payer: Self-pay | Admitting: Internal Medicine

## 2017-05-09 ENCOUNTER — Ambulatory Visit (HOSPITAL_BASED_OUTPATIENT_CLINIC_OR_DEPARTMENT_OTHER): Payer: Medicare Other | Admitting: Internal Medicine

## 2017-05-09 ENCOUNTER — Encounter: Payer: Self-pay | Admitting: Internal Medicine

## 2017-05-09 VITALS — BP 165/81 | HR 80 | Temp 99.3°F | Resp 18 | Ht 64.0 in | Wt 170.9 lb

## 2017-05-09 DIAGNOSIS — Z923 Personal history of irradiation: Secondary | ICD-10-CM | POA: Diagnosis not present

## 2017-05-09 DIAGNOSIS — Z9221 Personal history of antineoplastic chemotherapy: Secondary | ICD-10-CM | POA: Diagnosis not present

## 2017-05-09 DIAGNOSIS — C3491 Malignant neoplasm of unspecified part of right bronchus or lung: Secondary | ICD-10-CM

## 2017-05-09 DIAGNOSIS — C3411 Malignant neoplasm of upper lobe, right bronchus or lung: Secondary | ICD-10-CM | POA: Diagnosis not present

## 2017-05-09 DIAGNOSIS — C7802 Secondary malignant neoplasm of left lung: Secondary | ICD-10-CM

## 2017-05-09 DIAGNOSIS — C771 Secondary and unspecified malignant neoplasm of intrathoracic lymph nodes: Secondary | ICD-10-CM | POA: Diagnosis not present

## 2017-05-09 DIAGNOSIS — I10 Essential (primary) hypertension: Secondary | ICD-10-CM | POA: Diagnosis not present

## 2017-05-09 DIAGNOSIS — J449 Chronic obstructive pulmonary disease, unspecified: Secondary | ICD-10-CM

## 2017-05-09 NOTE — Progress Notes (Signed)
[name] [phone]   [phone]  OFFICE PROGRESS NOTE  Wardell Honour, MD [place] [address]  DIAGNOSIS: Stage IIIA (T2a, N2, M0) non-small cell lung cancer, squamous cell carcinoma presented with large right upper lobe lung mass with right hilar involvement diagnosed in November 2016.  PRIOR THERAPY:  1) Neoadjuvant systemic chemotherapy with carboplatin for AUC of 6 and paclitaxel 200 MG/M2 every 3 weeks, status post 3 cycles with partial response. 2) Video bronchoscopy, right video-assisted thoracoscopy, minithoracotomy, right upper lobectomy with lymph node dissection on 01/18/2016. The final pathology revealed residual squamous cell carcinoma measuring 3.1 cm with close resection margin and metastatic carcinoma to lymph nodes at levels 10R, 12R and 4R. (ypT2a, yN2).  3) Concurrent chemoradiation with weekly carboplatin for AUC of 2 and paclitaxel 45 MG/M2. First dose 02/27/2016. Status post 5 cycles. Last dose was given 03/26/2016. 4) SBRT to left lower lobe lung nodule under the care of Dr. Sondra Come completed on 11/06/2016.  CURRENT THERAPY: Observation.  INTERVAL HISTORY: Tammy Ball 76 y.o. female returns to the clinic today for follow-up visit. The patient is feeling fine today was no specific complaints. She denied having any chest pain, shortness of breath, cough or hemoptysis. She has no weight loss or night sweats. She has no nausea, vomiting, diarrhea or constipation. She denied having any recent fever or chills. She had repeat CT scan of the chest performed recently and she is here for evaluation and discussion of her scan results.  MEDICAL HISTORY: Past Medical History:  Diagnosis Date  . Anemia    was while doing chemo  . Anxiety   . Asthma   . Chemotherapy-induced neuropathy ([place]) 11/01/2015  . Claustrophobia   . COPD (chronic obstructive pulmonary disease) ([place])   . Depression   . Encounter for  antineoplastic chemotherapy 02/09/2016  . Glaucoma   . Headache    prior to menopause  . Heart murmur   . History of echocardiogram    Echo 2/17: EF 99-37%, grade 1 diastolic dysfunction, mild MR, trivial pericardial effusion  . History of nuclear stress test    Myoview 2/17: no ischemia or scar, EF 79%; low risk  . History of radiation therapy 10/30/16-11/06/16  . Hyperlipidemia   . Hypertension   . Insomnia 05/16/2016  . Non-small cell carcinoma of right lung, stage 2 ([place]) 10/03/2015  . Radiation 02/29/16-04/10/16   50.4 Gy to right central chest  . Renal cell carcinoma ([place])    L nephrectomy  in 2012    ALLERGIES:  is allergic to bee venom; amlodipine; levofloxacin; and hctz [hydrochlorothiazide].  MEDICATIONS:  Current Outpatient Prescriptions  Medication Sig Dispense Refill  . acetaminophen (TYLENOL) 500 MG tablet Take 500 mg by mouth every 6 (six) hours as needed for mild pain, moderate pain, fever or headache. Reported on 05/24/2016    . albuterol (PROVENTIL HFA;VENTOLIN HFA) 108 (90 BASE) MCG/ACT inhaler Inhale 2 puffs into the lungs every 4 (four) hours as needed for wheezing or shortness of breath. 1 Inhaler 0  . albuterol (PROVENTIL) (2.5 MG/3ML) 0.083% nebulizer solution Take 2.5 mg by nebulization every 6 (six) hours as needed for wheezing or shortness of breath.    Marland Kitchen alendronate (FOSAMAX) 70 MG tablet Take 1 tablet (70 mg total) by mouth every 7 (seven) days. Take with a full glass of water on an empty stomach. 4 tablet 11  . aspirin EC 81 MG tablet Take 81 mg by  mouth daily. Reported on 04/11/2016    . atorvastatin (LIPITOR) 40 MG tablet TAKE ONE TABLET BY MOUTH ONCE DAILY. 90 tablet 3  . azelastine (ASTELIN) 0.1 % nasal spray Place 2 sprays into both nostrils 2 (two) times daily. Use in each nostril as directed 30 mL 12  . cholecalciferol (VITAMIN D) 1000 units tablet Take 1 tablet by mouth daily.    Marland Kitchen doxycycline (VIBRAMYCIN) 100 MG capsule Take 1 capsule (100 mg total) by  mouth 2 (two) times daily. 20 capsule 0  . fish oil-omega-3 fatty acids 1000 MG capsule Take 1 capsule by mouth daily.     . fluticasone (FLONASE) 50 MCG/ACT nasal spray Place 2 sprays into both nostrils daily as needed for allergies.     . Fluticasone-Salmeterol (ADVAIR) 250-50 MCG/DOSE AEPB Inhale 1 puff into the lungs 2 (two) times daily. 60 each 11  . metoprolol succinate (TOPROL-XL) 50 MG 24 hr tablet TAKE 1 TABLET BY MOUTH EVERY DAY WITH OR IMMEDIATELY FOLLOWING A MEAL 30 tablet 2  . polyvinyl alcohol-povidone (REFRESH) 1.4-0.6 % ophthalmic solution Place 2 drops into both eyes daily as needed (for dry eyes). Reported on 05/24/2016    . temazepam (RESTORIL) 15 MG capsule Take 1 capsule (15 mg total) by mouth at bedtime as needed for sleep. 30 capsule 0  . traMADol (ULTRAM) 50 MG tablet Take 1-2 tablets (50-100 mg total) by mouth every 6 (six) hours as needed for moderate pain. Reported on 05/24/2016 120 tablet 02   Current Facility-Administered Medications  Medication Dose Route Frequency Provider Last Rate Last Dose  . levalbuterol (XOPENEX) nebulizer solution 0.63 mg  0.63 mg Nebulization Once [name], PA-C        SURGICAL HISTORY:  Past Surgical History:  Procedure Laterality Date  . BACK SURGERY     cervical 1991  . EYE SURGERY    . IR GENERIC HISTORICAL  01/16/2017   IR RADIOLOGIST EVAL & MGMT 01/16/2017 [facility]  . IR GENERIC HISTORICAL  01/31/2017   IR FLUORO GUIDED NEEDLE PLC ASPIRATION/INJECTION LOC 01/31/2017 Luanne Bras, MD [facility]  . IR GENERIC HISTORICAL  01/31/2017   IR VERTEBROPLASTY CERV/THOR BX INC UNI/BIL INC/INJECT/IMAGING 01/31/2017 Luanne Bras, MD [facility]  . IR GENERIC HISTORICAL  01/31/2017   IR VERTEBROPLASTY EA ADDL (T&LS) BX INC UNI/BIL INC INJECT/IMAGING 01/31/2017 Luanne Bras, MD [facility]  . kidney cancer    . NEPHRECTOMY    . SPINE SURGERY    . TUBAL LIGATION    . VIDEO ASSISTED THORACOSCOPY (VATS)/WEDGE RESECTION Right  01/18/2016   Procedure: VIDEO ASSISTED THORACOSCOPY (VATS)/LUNG RESECTION, THOROCOTOMY, RIGHT UPPER LOBECTOMY, LYMPH NODE DISSECTION, PLACEMENT OF ON Q;  Surgeon: [name], MD;  Location: [facility];  Service: Thoracic;  Laterality: Right;  Marland Kitchen VIDEO BRONCHOSCOPY Bilateral 09/20/2015   Procedure: VIDEO BRONCHOSCOPY WITHOUT FLUORO;  Surgeon: Rigoberto Noel, MD;  Location: [facility];  Service: Cardiopulmonary;  Laterality: Bilateral;  . VIDEO BRONCHOSCOPY N/A 01/18/2016   Procedure: VIDEO BRONCHOSCOPY;  Surgeon: [name], MD;  Location: [facility];  Service: Thoracic;  Laterality: N/A;    REVIEW OF SYSTEMS:  A comprehensive review of systems was negative except for: Musculoskeletal: positive for arthralgias   PHYSICAL EXAMINATION: General appearance: alert, cooperative and no distress Head: Normocephalic, without obvious abnormality, atraumatic Neck: no adenopathy, no JVD, supple, symmetrical, trachea midline and thyroid not enlarged, symmetric, no tenderness/mass/nodules Lymph nodes: Cervical, supraclavicular, and axillary nodes normal. Resp: clear to auscultation bilaterally Back: symmetric, no curvature.  ROM normal. No CVA tenderness. Cardio: regular rate and rhythm, S1, S2 normal, no murmur, click, rub or gallop GI: soft, non-tender; bowel sounds normal; no masses,  no organomegaly Extremities: extremities normal, atraumatic, no cyanosis or edema  ECOG PERFORMANCE STATUS: 1 - Symptomatic but completely ambulatory  Blood pressure (!) 165/81, pulse 80, temperature 99.3 F (37.4 C), temperature source Oral, resp. rate 18, height 5\' 4"  (1.626 m), weight 170 lb 14.4 oz (77.5 kg), SpO2 94 %.  LABORATORY DATA: Lab Results  Component Value Date   WBC 6.0 05/06/2017   HGB 12.9 05/06/2017   HCT 39.7 05/06/2017   MCV 93.6 05/06/2017   PLT 274 05/06/2017      Chemistry      Component Value Date/Time   NA 142 05/06/2017 0832   K 5.0 05/06/2017 0832   CL 101 03/05/2017 1048   CO2 26  05/06/2017 0832   BUN 20.2 05/06/2017 0832   CREATININE 1.1 05/06/2017 0832   GLU 105 12/10/2009      Component Value Date/Time   CALCIUM 10.0 05/06/2017 0832   ALKPHOS 82 05/06/2017 0832   AST 18 05/06/2017 0832   ALT 14 05/06/2017 0832   BILITOT 0.49 05/06/2017 0832       RADIOGRAPHIC STUDIES: Ct Chest W Contrast  Result Date: 05/06/2017 CLINICAL DATA:  Stage IIIA squamous cell lung carcinoma of the right upper lobe diagnosed November 2016 status post neoadjuvant chemotherapy, right upper lobectomy 01/18/2016 and subsequent concurrent chemo radiation therapy. SBRT to left lower lobe pulmonary metastasis completed 11/06/2016. Patient presents for restaging on observation. EXAM: CT CHEST WITH CONTRAST TECHNIQUE: Multidetector CT imaging of the chest was performed during intravenous contrast administration. CONTRAST:  45mL ISOVUE-300 IOPAMIDOL (ISOVUE-300) INJECTION 61% COMPARISON:  02/07/2017 chest CT. FINDINGS: Cardiovascular: Normal heart size. No significant pericardial fluid/thickening. Left anterior descending coronary atherosclerosis. Atherosclerotic nonaneurysmal thoracic aorta. Normal caliber pulmonary arteries. No central pulmonary emboli. Mediastinum/Nodes: No discrete thyroid nodules. Unremarkable esophagus. No pathologically enlarged axillary, mediastinal or hilar lymph nodes. Lungs/Pleura: Status post right upper lobectomy. No pneumothorax. No pleural effusion. No acute consolidative airspace disease, lung masses or significant pulmonary nodules. Mild patchy reticulation and ground-glass attenuation in the parahilar right mid lung with associated slight volume loss and distortion, unchanged, compatible with mild postradiation change. Bandlike patchy reticulation and ground-glass attenuation in the dependent basilar left lower lobe, increased, compatible with evolving postradiation change. No residual measurable pulmonary nodule in this location. Mild-to-moderate centrilobular  emphysema. Upper abdomen: Left nephrectomy.  No discrete adrenal nodules. Musculoskeletal: No aggressive appearing focal osseous lesions. Partially visualized surgical hardware from ACDF in the lower cervical spine. Stable postsurgical changes from vertebroplasty at T3 and T4, with associated stable chronic mild T3 and severe T4 vertebral compression fractures. Mild T6 vertebral compression fracture with associated mild patchy sclerosis is new. IMPRESSION: 1. No evidence of local tumor recurrence in the right lung status post right upper lobectomy. Stable mild radiation fibrosis in the parahilar right lung. 2. No evidence of recurrent pulmonary nodule in the basilar left lower lobe, with evolving postradiation changes in this location. 3. No evidence of metastatic disease in the chest. 4. Mild T6 vertebral compression fracture, new since 02/07/2017, indicating acute or subacute age. Stable post vertebroplasty changes at the T3 and T4 chronic vertebral compression fractures. 5. Coronary atherosclerosis. Aortic Atherosclerosis (ICD10-I70.0) and Emphysema (ICD10-J43.9). These results will be called to the ordering clinician or representative by the Radiologist Assistant, and communication documented in the PACS or zVision Dashboard. Electronically Signed  By: Ilona Sorrel M.D.   On: 05/06/2017 13:38    ASSESSMENT AND PLAN:  This is a very pleasant 76 years old white female with a stage IIIa non-small cell lung cancer, squamous cell carcinoma status post neoadjuvant systemic chemotherapy was carboplatin and paclitaxel with partial response followed by right upper lobectomy and lymph node dissection. She was found to have evidence of metastatic disease to the mediastinal lymph nodes and close resection margin. The patient underwent a course of concurrent chemoradiation with weekly carboplatin and paclitaxel. She was also treated with SBRT left lower lobe pulmonary nodule under the care of Dr. Sondra Come. The patient is  currently on observation and feeling fine. She had repeat CT scan of the chest that showed no evidence for disease recurrence but she had edema mild T6 vertebral compression fracture. The patient is not interested in seeing interventional radiology for vertebroplasty again after she had a lot of pain with the previous procedure. She is currently asymptomatic from this lesion. She is also currently on Fosamax and calcium. I recommended for the patient to continue on observation with repeat CT scan of the chest in 6 months for restaging of her disease. For the hypertension, strongly advised the patient to take her blood pressure medication and to consult with her primary care physician for adjustment of her doses. She was advised to call immediately if she has any concerning symptoms in the interval. The patient voices understanding of current disease status and treatment options and is in agreement with the current care plan. All questions were answered. The patient knows to call the clinic with any problems, questions or concerns. We can certainly see the patient much sooner if necessary. I spent 10 minutes counseling the patient face to face. The total time spent in the appointment was 15 minutes.  Disclaimer: This note was dictated with voice recognition software. Similar sounding words can inadvertently be transcribed and may not be corrected upon review.

## 2017-05-09 NOTE — Telephone Encounter (Signed)
Scheduled appt per 6/28 los - Gave patient AVS and calender per los.  

## 2017-07-01 ENCOUNTER — Other Ambulatory Visit: Payer: Self-pay | Admitting: Family Medicine

## 2017-07-29 ENCOUNTER — Other Ambulatory Visit: Payer: Self-pay | Admitting: Family Medicine

## 2017-08-23 DIAGNOSIS — Z23 Encounter for immunization: Secondary | ICD-10-CM | POA: Diagnosis not present

## 2017-08-27 ENCOUNTER — Encounter: Payer: Self-pay | Admitting: Family Medicine

## 2017-09-09 ENCOUNTER — Other Ambulatory Visit: Payer: Self-pay | Admitting: Family Medicine

## 2017-09-11 ENCOUNTER — Other Ambulatory Visit: Payer: Self-pay

## 2017-09-11 NOTE — Telephone Encounter (Signed)
Pt req for refill Metoprolol -completed & sent to pharmacy 10/29 Refill for Restoril, Tramadol sent to Dr. Tamala Julian for review.

## 2017-09-12 DIAGNOSIS — H40013 Open angle with borderline findings, low risk, bilateral: Secondary | ICD-10-CM | POA: Diagnosis not present

## 2017-09-12 DIAGNOSIS — H5211 Myopia, right eye: Secondary | ICD-10-CM | POA: Diagnosis not present

## 2017-09-12 DIAGNOSIS — H524 Presbyopia: Secondary | ICD-10-CM | POA: Diagnosis not present

## 2017-09-12 DIAGNOSIS — H04123 Dry eye syndrome of bilateral lacrimal glands: Secondary | ICD-10-CM | POA: Diagnosis not present

## 2017-09-12 DIAGNOSIS — H52222 Regular astigmatism, left eye: Secondary | ICD-10-CM | POA: Diagnosis not present

## 2017-09-12 DIAGNOSIS — Z961 Presence of intraocular lens: Secondary | ICD-10-CM | POA: Diagnosis not present

## 2017-09-12 DIAGNOSIS — H52221 Regular astigmatism, right eye: Secondary | ICD-10-CM | POA: Diagnosis not present

## 2017-09-12 DIAGNOSIS — H1859 Other hereditary corneal dystrophies: Secondary | ICD-10-CM | POA: Diagnosis not present

## 2017-10-23 ENCOUNTER — Telehealth: Payer: Self-pay | Admitting: Internal Medicine

## 2017-10-23 NOTE — Telephone Encounter (Signed)
Scheduled appt per 12/12 sch msg - spoke with patient regarding appt.

## 2017-10-25 ENCOUNTER — Encounter: Payer: Self-pay | Admitting: Family Medicine

## 2017-10-27 ENCOUNTER — Other Ambulatory Visit: Payer: Self-pay | Admitting: Family Medicine

## 2017-11-11 ENCOUNTER — Ambulatory Visit (HOSPITAL_COMMUNITY)
Admission: RE | Admit: 2017-11-11 | Discharge: 2017-11-11 | Disposition: A | Discharge status: Home / Self Care | Payer: Medicare Other | Source: Ambulatory Visit | Attending: Internal Medicine | Admitting: Internal Medicine

## 2017-11-11 ENCOUNTER — Other Ambulatory Visit (HOSPITAL_BASED_OUTPATIENT_CLINIC_OR_DEPARTMENT_OTHER): Payer: Medicare Other

## 2017-11-11 DIAGNOSIS — I2510 Atherosclerotic heart disease of native coronary artery without angina pectoris: Secondary | ICD-10-CM | POA: Insufficient documentation

## 2017-11-11 DIAGNOSIS — Z9889 Other specified postprocedural states: Secondary | ICD-10-CM | POA: Diagnosis not present

## 2017-11-11 DIAGNOSIS — C771 Secondary and unspecified malignant neoplasm of intrathoracic lymph nodes: Secondary | ICD-10-CM

## 2017-11-11 DIAGNOSIS — M4854XD Collapsed vertebra, not elsewhere classified, thoracic region, subsequent encounter for fracture with routine healing: Secondary | ICD-10-CM | POA: Diagnosis not present

## 2017-11-11 DIAGNOSIS — C3491 Malignant neoplasm of unspecified part of right bronchus or lung: Secondary | ICD-10-CM | POA: Insufficient documentation

## 2017-11-11 DIAGNOSIS — C7802 Secondary malignant neoplasm of left lung: Secondary | ICD-10-CM

## 2017-11-11 DIAGNOSIS — C3490 Malignant neoplasm of unspecified part of unspecified bronchus or lung: Secondary | ICD-10-CM | POA: Diagnosis not present

## 2017-11-11 DIAGNOSIS — J449 Chronic obstructive pulmonary disease, unspecified: Secondary | ICD-10-CM | POA: Diagnosis not present

## 2017-11-11 DIAGNOSIS — C3411 Malignant neoplasm of upper lobe, right bronchus or lung: Secondary | ICD-10-CM

## 2017-11-11 DIAGNOSIS — I700 Atherosclerosis of aorta: Secondary | ICD-10-CM | POA: Diagnosis not present

## 2017-11-11 LAB — COMPREHENSIVE METABOLIC PANEL
ALT: 19 U/L (ref 0–55)
AST: 18 U/L (ref 5–34)
Albumin: 4 g/dL (ref 3.5–5.0)
Alkaline Phosphatase: 102 U/L (ref 40–150)
Anion Gap: 9 mEq/L (ref 3–11)
BUN: 30.3 mg/dL — ABNORMAL HIGH (ref 7.0–26.0)
CO2: 28 mEq/L (ref 22–29)
Calcium: 10 mg/dL (ref 8.4–10.4)
Chloride: 103 mEq/L (ref 98–109)
Creatinine: 1.3 mg/dL — ABNORMAL HIGH (ref 0.6–1.1)
EGFR: 40 mL/min/{1.73_m2} — ABNORMAL LOW (ref >60)
Glucose: 118 mg/dl (ref 70–140)
Potassium: 4.6 mEq/L (ref 3.5–5.1)
Sodium: 140 mEq/L (ref 136–145)
Total Bilirubin: 0.42 mg/dL (ref 0.20–1.20)
Total Protein: 7.4 g/dL (ref 6.4–8.3)

## 2017-11-11 LAB — CBC WITH DIFFERENTIAL/PLATELET
BASO%: 0.6 % (ref 0.0–2.0)
Basophils Absolute: 0 10*3/uL (ref 0.0–0.1)
EOS%: 3.6 % (ref 0.0–7.0)
Eosinophils Absolute: 0.2 10*3/uL (ref 0.0–0.5)
HCT: 41.9 % (ref 34.8–46.6)
HGB: 13.7 g/dL (ref 11.6–15.9)
LYMPH%: 22.9 % (ref 14.0–49.7)
MCH: 32 pg (ref 25.1–34.0)
MCHC: 32.7 g/dL (ref 31.5–36.0)
MCV: 97.9 fL (ref 79.5–101.0)
MONO#: 0.7 10*3/uL (ref 0.1–0.9)
MONO%: 11.1 % (ref 0.0–14.0)
NEUT#: 4.1 10*3/uL (ref 1.5–6.5)
NEUT%: 61.8 % (ref 38.4–76.8)
Platelets: 282 10*3/uL (ref 145–400)
RBC: 4.28 10*6/uL (ref 3.70–5.45)
RDW: 15.9 % — ABNORMAL HIGH (ref 11.2–14.5)
WBC: 6.6 10*3/uL (ref 3.9–10.3)
lymph#: 1.5 10*3/uL (ref 0.9–3.3)

## 2017-11-11 MED ORDER — IOPAMIDOL (ISOVUE-300) INJECTION 61%
INTRAVENOUS | Status: AC
  Filled 2017-11-11: qty 75

## 2017-11-11 MED ORDER — IOPAMIDOL (ISOVUE-300) INJECTION 61%
75.0000 mL | Freq: Once | INTRAVENOUS | Status: AC | PRN
  Administered 2017-11-11: 75 mL via INTRAVENOUS

## 2017-11-13 ENCOUNTER — Ambulatory Visit (INDEPENDENT_AMBULATORY_CARE_PROVIDER_SITE_OTHER): Payer: Medicare Other

## 2017-11-13 ENCOUNTER — Ambulatory Visit (INDEPENDENT_AMBULATORY_CARE_PROVIDER_SITE_OTHER): Payer: Medicare Other | Admitting: Family Medicine

## 2017-11-13 ENCOUNTER — Encounter: Payer: Self-pay | Admitting: Family Medicine

## 2017-11-13 ENCOUNTER — Ambulatory Visit: Payer: Medicare Other | Admitting: Internal Medicine

## 2017-11-13 ENCOUNTER — Other Ambulatory Visit: Payer: Self-pay

## 2017-11-13 VITALS — BP 132/88 | HR 96 | Temp 98.1°F | Ht 64.0 in | Wt 174.0 lb

## 2017-11-13 VITALS — BP 132/88 | HR 96 | Temp 98.0°F | Resp 16 | Ht 64.0 in | Wt 174.0 lb

## 2017-11-13 DIAGNOSIS — M5432 Sciatica, left side: Secondary | ICD-10-CM | POA: Diagnosis not present

## 2017-11-13 DIAGNOSIS — F5102 Adjustment insomnia: Secondary | ICD-10-CM

## 2017-11-13 DIAGNOSIS — Z902 Acquired absence of lung [part of]: Secondary | ICD-10-CM | POA: Diagnosis not present

## 2017-11-13 DIAGNOSIS — E7800 Pure hypercholesterolemia, unspecified: Secondary | ICD-10-CM

## 2017-11-13 DIAGNOSIS — M4316 Spondylolisthesis, lumbar region: Secondary | ICD-10-CM | POA: Diagnosis not present

## 2017-11-13 DIAGNOSIS — Z0000 Encounter for general adult medical examination without abnormal findings: Secondary | ICD-10-CM | POA: Diagnosis not present

## 2017-11-13 DIAGNOSIS — C642 Malignant neoplasm of left kidney, except renal pelvis: Secondary | ICD-10-CM

## 2017-11-13 DIAGNOSIS — C3491 Malignant neoplasm of unspecified part of right bronchus or lung: Secondary | ICD-10-CM

## 2017-11-13 DIAGNOSIS — J449 Chronic obstructive pulmonary disease, unspecified: Secondary | ICD-10-CM

## 2017-11-13 DIAGNOSIS — I10 Essential (primary) hypertension: Secondary | ICD-10-CM

## 2017-11-13 DIAGNOSIS — F329 Major depressive disorder, single episode, unspecified: Secondary | ICD-10-CM | POA: Diagnosis not present

## 2017-11-13 DIAGNOSIS — Z8781 Personal history of (healed) traumatic fracture: Secondary | ICD-10-CM

## 2017-11-13 DIAGNOSIS — M545 Low back pain: Secondary | ICD-10-CM | POA: Diagnosis not present

## 2017-11-13 LAB — POCT URINALYSIS DIP (MANUAL ENTRY)
Bilirubin, UA: NEGATIVE
Blood, UA: NEGATIVE
Glucose, UA: NEGATIVE mg/dL
Ketones, POC UA: NEGATIVE mg/dL
Nitrite, UA: NEGATIVE
Protein Ur, POC: NEGATIVE mg/dL
Spec Grav, UA: <=1.005 — AB (ref 1.010–1.025)
Urobilinogen, UA: 0.2 E.U./dL
pH, UA: 5 (ref 5.0–8.0)

## 2017-11-13 MED ORDER — TRAMADOL HCL 50 MG PO TABS: 50.0000 mg | ORAL_TABLET | Freq: Four times a day (QID) | ORAL | 2 refills | Status: DC | PRN

## 2017-11-13 MED ORDER — TRAZODONE HCL 50 MG PO TABS: 25.0000 mg | ORAL_TABLET | Freq: Every evening | ORAL | 1 refills | Status: DC | PRN

## 2017-11-13 MED ORDER — METOPROLOL SUCCINATE ER 50 MG PO TB24: 50.0000 mg | ORAL_TABLET | Freq: Every day | ORAL | 1 refills | Status: DC

## 2017-11-13 MED ORDER — ATORVASTATIN CALCIUM 40 MG PO TABS: ORAL_TABLET | ORAL | 3 refills | Status: DC

## 2017-11-13 MED ORDER — FLUTICASONE-SALMETEROL 250-50 MCG/DOSE IN AEPB: 1.0000 | INHALATION_SPRAY | Freq: Two times a day (BID) | RESPIRATORY_TRACT | 11 refills | Status: DC

## 2017-11-13 NOTE — Patient Instructions (Addendum)
   IF you received an x-ray today, you will receive an invoice from [facility]. Please contact [facility] at [phone] with questions or concerns regarding your invoice.   IF you received labwork today, you will receive an invoice from [facility]. Please contact [facility] at [phone] with questions or concerns regarding your invoice.   Our billing staff will not be able to assist you with questions regarding bills from these companies.  You will be contacted with the lab results as soon as they are available. The fastest way to get your results is to activate your My Chart account. Instructions are located on the last page of this paperwork. If you have not heard from us regarding the results in 2 weeks, please contact this office.      Preventive Care 65 Years and Older, Female Preventive care refers to lifestyle choices and visits with your health care provider that can promote health and wellness. What does preventive care include?  A yearly physical exam. This is also called an annual well check.  Dental exams once or twice a year.  Routine eye exams. Ask your health care provider how often you should have your eyes checked.  Personal lifestyle choices, including: ? Daily care of your teeth and gums. ? Regular physical activity. ? Eating a healthy diet. ? Avoiding tobacco and drug use. ? Limiting alcohol use. ? Practicing safe sex. ? Taking low-dose aspirin every day. ? Taking vitamin and mineral supplements as recommended by your health care provider. What happens during an annual well check? The services and screenings done by your health care provider during your annual well check will depend on your age, overall health, lifestyle risk factors, and family history of disease. Counseling Your health care provider may ask you questions about your:  Alcohol use.  Tobacco use.  Drug use.  Emotional well-being.  Home and relationship  well-being.  Sexual activity.  Eating habits.  History of falls.  Memory and ability to understand (cognition).  Work and work environment.  Reproductive health.  Screening You may have the following tests or measurements:  Height, weight, and BMI.  Blood pressure.  Lipid and cholesterol levels. These may be checked every 5 years, or more frequently if you are over 50 years old.  Skin check.  Lung cancer screening. You may have this screening every year starting at age 55 if you have a 30-pack-year history of smoking and currently smoke or have quit within the past 15 years.  Fecal occult blood test (FOBT) of the stool. You may have this test every year starting at age 50.  Flexible sigmoidoscopy or colonoscopy. You may have a sigmoidoscopy every 5 years or a colonoscopy every 10 years starting at age 50.  Hepatitis C blood test.  Hepatitis B blood test.  Sexually transmitted disease (STD) testing.  Diabetes screening. This is done by checking your blood sugar (glucose) after you have not eaten for a while (fasting). You may have this done every 1-3 years.  Bone density scan. This is done to screen for osteoporosis. You may have this done starting at age 65.  Mammogram. This may be done every 1-2 years. Talk to your health care provider about how often you should have regular mammograms.  Talk with your health care provider about your test results, treatment options, and if necessary, the need for more tests. Vaccines Your health care provider may recommend certain vaccines, such as:  Influenza vaccine. This is recommended every year.    Tetanus, diphtheria, and acellular pertussis (Tdap, Td) vaccine. You may need a Td booster every 10 years.  Varicella vaccine. You may need this if you have not been vaccinated.  Zoster vaccine. You may need this after age 60.  Measles, mumps, and rubella (MMR) vaccine. You may need at least one dose of MMR if you were born in  1955 or later. You may also need a second dose.  Pneumococcal 13-valent conjugate (PCV13) vaccine. One dose is recommended after age 65.  Pneumococcal polysaccharide (PPSV23) vaccine. One dose is recommended after age 65.  Meningococcal vaccine. You may need this if you have certain conditions.  Hepatitis A vaccine. You may need this if you have certain conditions or if you travel or work in places where you may be exposed to hepatitis A.  Hepatitis B vaccine. You may need this if you have certain conditions or if you travel or work in places where you may be exposed to hepatitis B.  Haemophilus influenzae type b (Hib) vaccine. You may need this if you have certain conditions.  Talk to your health care provider about which screenings and vaccines you need and how often you need them. This information is not intended to replace advice given to you by your health care provider. Make sure you discuss any questions you have with your health care provider. Document Released: 11/25/2015 Document Revised: 07/18/2016 Document Reviewed: 08/30/2015 Elsevier Interactive Patient Education  2018 [employer]  

## 2017-11-13 NOTE — Patient Instructions (Addendum)
Tammy Ball , Thank you for taking time to come for your Medicare Wellness Visit. I appreciate your ongoing commitment to your health goals. Please review the following plan we discussed and let me know if I can assist you in the future.   Screening recommendations/referrals: Colonoscopy: up to date, next due 05/09/2022 Mammogram: You state you will call and schedule this on your own.  Bone Density: up to date, next due 05/31/2020 Recommended yearly ophthalmology/optometry visit for glaucoma screening and checkup Recommended yearly dental visit for hygiene and checkup  Vaccinations: Influenza vaccine: up to date Pneumococcal vaccine: up to date Tdap vaccine: up to date, next due 12/03/2023 Shingles vaccine: Check with your pharmacy about receiving the Shingrix vaccine    Advanced directives: on file  Conditions/risks identified: Try to lose weight and get to around 135 lbs in the near future   Next appointment: today @ 4:20 pm with Dr. Tamala Julian, Next Annual Wellness Visit 11/14/2018 @ 8:20 am with [place] 65 Years and Older, Female Preventive care refers to lifestyle choices and visits with your health care provider that can promote health and wellness. What does preventive care include?  A yearly physical exam. This is also called an annual well check.  Dental exams once or twice a year.  Routine eye exams. Ask your health care provider how often you should have your eyes checked.  Personal lifestyle choices, including:  Daily care of your teeth and gums.  Regular physical activity.  Eating a healthy diet.  Avoiding tobacco and drug use.  Limiting alcohol use.  Practicing safe sex.  Taking low-dose aspirin every day.  Taking vitamin and mineral supplements as recommended by your health care provider. What happens during an annual well check? The services and screenings done by your health care provider during your annual well check will depend  on your age, overall health, lifestyle risk factors, and family history of disease. Counseling  Your health care provider may ask you questions about your:  Alcohol use.  Tobacco use.  Drug use.  Emotional well-being.  Home and relationship well-being.  Sexual activity.  Eating habits.  History of falls.  Memory and ability to understand (cognition).  Work and work Statistician.  Reproductive health. Screening  You may have the following tests or measurements:  Height, weight, and BMI.  Blood pressure.  Lipid and cholesterol levels. These may be checked every 5 years, or more frequently if you are over 34 years old.  Skin check.  Lung cancer screening. You may have this screening every year starting at age 44 if you have a 30-pack-year history of smoking and currently smoke or have quit within the past 15 years.  Fecal occult blood test (FOBT) of the stool. You may have this test every year starting at age 43.  Flexible sigmoidoscopy or colonoscopy. You may have a sigmoidoscopy every 5 years or a colonoscopy every 10 years starting at age 31.  Hepatitis C blood test.  Hepatitis B blood test.  Sexually transmitted disease (STD) testing.  Diabetes screening. This is done by checking your blood sugar (glucose) after you have not eaten for a while (fasting). You may have this done every 1-3 years.  Bone density scan. This is done to screen for osteoporosis. You may have this done starting at age 35.  Mammogram. This may be done every 1-2 years. Talk to your health care provider about how often you should have regular mammograms. Talk with your health  care provider about your test results, treatment options, and if necessary, the need for more tests. Vaccines  Your health care provider may recommend certain vaccines, such as:  Influenza vaccine. This is recommended every year.  Tetanus, diphtheria, and acellular pertussis (Tdap, Td) vaccine. You may need a Td  booster every 10 years.  Zoster vaccine. You may need this after age 54.  Pneumococcal 13-valent conjugate (PCV13) vaccine. One dose is recommended after age 67.  Pneumococcal polysaccharide (PPSV23) vaccine. One dose is recommended after age 31. Talk to your health care provider about which screenings and vaccines you need and how often you need them. This information is not intended to replace advice given to you by your health care provider. Make sure you discuss any questions you have with your health care provider. Document Released: 11/25/2015 Document Revised: 07/18/2016 Document Reviewed: 08/30/2015 Elsevier Interactive Patient Education  2017 [place] Prevention in the Home Falls can cause injuries. They can happen to people of all ages. There are many things you can do to make your home safe and to help prevent falls. What can I do on the outside of my home?  Regularly fix the edges of walkways and driveways and fix any cracks.  Remove anything that might make you trip as you walk through a door, such as a raised step or threshold.  Trim any bushes or trees on the path to your home.  Use bright outdoor lighting.  Clear any walking paths of anything that might make someone trip, such as rocks or tools.  Regularly check to see if handrails are loose or broken. Make sure that both sides of any steps have handrails.  Any raised decks and porches should have guardrails on the edges.  Have any leaves, snow, or ice cleared regularly.  Use sand or salt on walking paths during winter.  Clean up any spills in your garage right away. This includes oil or grease spills. What can I do in the bathroom?  Use night lights.  Install grab bars by the toilet and in the tub and shower. Do not use towel bars as grab bars.  Use non-skid mats or decals in the tub or shower.  If you need to sit down in the shower, use a plastic, non-slip stool.  Keep the floor dry. Clean up  any water that spills on the floor as soon as it happens.  Remove soap buildup in the tub or shower regularly.  Attach bath mats securely with double-sided non-slip rug tape.  Do not have throw rugs and other things on the floor that can make you trip. What can I do in the bedroom?  Use night lights.  Make sure that you have a light by your bed that is easy to reach.  Do not use any sheets or blankets that are too big for your bed. They should not hang down onto the floor.  Have a firm chair that has side arms. You can use this for support while you get dressed.  Do not have throw rugs and other things on the floor that can make you trip. What can I do in the kitchen?  Clean up any spills right away.  Avoid walking on wet floors.  Keep items that you use a lot in easy-to-reach places.  If you need to reach something above you, use a strong step stool that has a grab bar.  Keep electrical cords out of the way.  Do not use floor  polish or wax that makes floors slippery. If you must use wax, use non-skid floor wax.  Do not have throw rugs and other things on the floor that can make you trip. What can I do with my stairs?  Do not leave any items on the stairs.  Make sure that there are handrails on both sides of the stairs and use them. Fix handrails that are broken or loose. Make sure that handrails are as long as the stairways.  Check any carpeting to make sure that it is firmly attached to the stairs. Fix any carpet that is loose or worn.  Avoid having throw rugs at the top or bottom of the stairs. If you do have throw rugs, attach them to the floor with carpet tape.  Make sure that you have a light switch at the top of the stairs and the bottom of the stairs. If you do not have them, ask someone to add them for you. What else can I do to help prevent falls?  Wear shoes that:  Do not have high heels.  Have rubber bottoms.  Are comfortable and fit you well.  Are  closed at the toe. Do not wear sandals.  If you use a stepladder:  Make sure that it is fully opened. Do not climb a closed stepladder.  Make sure that both sides of the stepladder are locked into place.  Ask someone to hold it for you, if possible.  Clearly mark and make sure that you can see:  Any grab bars or handrails.  First and last steps.  Where the edge of each step is.  Use tools that help you move around (mobility aids) if they are needed. These include:  Canes.  Walkers.  Scooters.  Crutches.  Turn on the lights when you go into a dark area. Replace any light bulbs as soon as they burn out.  Set up your furniture so you have a clear path. Avoid moving your furniture around.  If any of your floors are uneven, fix them.  If there are any pets around you, be aware of where they are.  Review your medicines with your doctor. Some medicines can make you feel dizzy. This can increase your chance of falling. Ask your doctor what other things that you can do to help prevent falls. This information is not intended to replace advice given to you by your health care provider. Make sure you discuss any questions you have with your health care provider. Document Released: 08/25/2009 Document Revised: 04/05/2016 Document Reviewed: 12/03/2014 Elsevier Interactive Patient Education  2017 [employer].

## 2017-11-13 NOTE — Progress Notes (Signed)
Subjective:    Patient ID: Tammy Ball, female    DOB: Apr 01, 1941, 77 y.o.   MRN: [id number]  11/13/2017  Annual Exam    HPI This 77 y.o. female presents for evaluation for six month follow-up of chronic medical conditions including hypertension, hypercholesterolemia, compression fractures, non-small cell carcinoma lung cancer, history of renal cancer.  Last physical: Pap smear:  n/a Mammogram:  2016; due; plans to call and schedule. Colonoscopy:  2013 [facility]. Bone density: Eye exam:  Two months ago. Dental exam:  Every six months.  Osteoporosis: stopped Fosamax after two months; joint pains.  Middle back pain and lower back pain: having trouble sleeping; due to sciatica into LEFT leg.  No n/t/burning; only having pain down to knee.   No previous ortho consultation.  Saw Dr.Cabbell for second opinion.  Recommended surgery but pt declined.  Has had to cut down on working.  Pain is horrible.  Hypertension: Patient reports good compliance with medication, good tolerance to medication, and good symptom control.    Hypercholesterolemia: Patient reports good compliance with medication, good tolerance to medication, and good symptom control.    COPD: Patient reports good compliance with medication, good tolerance to medication, and good symptom control.    Non-Small cell carcinoma lung cell cancer :  Stage IIIa s/p neoadjuvant systemic chemotherapy with carboplatin and paclitaxel followed by partial RUL and lymph node dissection.  Metastatic disease in mediastinal lymph nodes and close resection margin.  Underwent additional chemoradiation.   Now undergoing CTs of the lung every 6 months.  Management by Dr. Felipa Eth [facility] oncology.   Visual Acuity Screening   Right eye Left eye Both eyes  Without correction: 20/30 20/30 20/30   With correction:       BP Readings from Last 3 Encounters:  11/13/17 132/88  11/13/17 132/88  05/09/17 (!) 165/81   Wt Readings from Last 3  Encounters:  11/13/17 174 lb (78.9 kg)  11/13/17 174 lb (78.9 kg)  05/09/17 170 lb 14.4 oz (77.5 kg)   Immunization History  Administered Date(s) Administered  . Hepatitis B 11/12/1998  . Influenza Split 09/12/2012  . Influenza, High Dose Seasonal PF 08/23/2017  . Influenza,inj,Quad PF,6+ Mos 07/18/2013  . Influenza-Unspecified 07/29/2014, 07/23/2015, 08/25/2016  . Pneumococcal Conjugate-13 07/29/2015  . Pneumococcal Polysaccharide-23 11/12/2000, 10/23/2012  . Pneumococcal-Unspecified 10/23/2012, 08/23/2017  . Tdap 12/02/2013  . Zoster 12/13/2010   Health Maintenance  Topic Date Due  . MAMMOGRAM  01/09/2018 (Originally 05/16/2017)  . COLONOSCOPY  05/09/2022  . TETANUS/TDAP  12/03/2023  . INFLUENZA VACCINE  Completed  . DEXA SCAN  Completed  . PNA vac Low Risk Adult  Completed    Review of Systems  Constitutional: Negative for activity change, appetite change, chills, diaphoresis, fatigue, fever and unexpected weight change.  HENT: Negative for congestion, dental problem, drooling, ear discharge, ear pain, facial swelling, hearing loss, mouth sores, nosebleeds, postnasal drip, rhinorrhea, sinus pressure, sneezing, sore throat, tinnitus, trouble swallowing and voice change.   Eyes: Negative for photophobia, pain, discharge, redness, itching and visual disturbance.  Respiratory: Negative for apnea, cough, choking, chest tightness, shortness of breath, wheezing and stridor.   Cardiovascular: Negative for chest pain, palpitations and leg swelling.  Gastrointestinal: Negative for abdominal distention, abdominal pain, anal bleeding, blood in stool, constipation, diarrhea, nausea, rectal pain and vomiting.  Endocrine: Negative for cold intolerance, heat intolerance, polydipsia, polyphagia and polyuria.  Genitourinary: Negative for decreased urine volume, difficulty urinating, dyspareunia, dysuria, enuresis, flank pain, frequency, genital sores, hematuria,  menstrual problem, pelvic pain,  urgency, vaginal bleeding, vaginal discharge and vaginal pain.       Nocturia x 4-5.  No urinary leakage.  Musculoskeletal: Positive for back pain. Negative for arthralgias, gait problem, joint swelling, myalgias, neck pain and neck stiffness.  Skin: Negative for color change, pallor, rash and wound.  Allergic/Immunologic: Negative for environmental allergies, food allergies and immunocompromised state.  Neurological: Negative for dizziness, tremors, seizures, syncope, facial asymmetry, speech difficulty, weakness, light-headedness, numbness and headaches.  Hematological: Negative for adenopathy. Does not bruise/bleed easily.  Psychiatric/Behavioral: Positive for sleep disturbance. Negative for agitation, behavioral problems, confusion, decreased concentration, dysphoric mood, hallucinations, self-injury and suicidal ideas. The patient is not nervous/anxious and is not hyperactive.        Bedtime 930; wakes up 530.    Past Medical History:  Diagnosis Date  . Anemia    was while doing chemo  . Anxiety   . Asthma   . Chemotherapy-induced neuropathy ([place]) 11/01/2015  . Claustrophobia   . COPD (chronic obstructive pulmonary disease) ([place])   . Depression   . Encounter for antineoplastic chemotherapy 02/09/2016  . Glaucoma   . Headache    prior to menopause  . Heart murmur   . History of echocardiogram    Echo 2/17: EF 10-93%, grade 1 diastolic dysfunction, mild MR, trivial pericardial effusion  . History of nuclear stress test    Myoview 2/17: no ischemia or scar, EF 79%; low risk  . History of radiation therapy 10/30/16-11/06/16  . Hyperlipidemia   . Hypertension   . Insomnia 05/16/2016  . Non-small cell carcinoma of right lung, stage 2 ([place]) 10/03/2015  . Radiation 02/29/16-04/10/16   50.4 Gy to right central chest  . Renal cell carcinoma ([place])    L nephrectomy  in 2012   Past Surgical History:  Procedure Laterality Date  . BACK SURGERY     cervical 1991  . EYE SURGERY    . IR  GENERIC HISTORICAL  01/16/2017   IR RADIOLOGIST EVAL & MGMT 01/16/2017 [facility]  . IR GENERIC HISTORICAL  01/31/2017   IR FLUORO GUIDED NEEDLE PLC ASPIRATION/INJECTION LOC 01/31/2017 Luanne Bras, MD [facility]  . IR GENERIC HISTORICAL  01/31/2017   IR VERTEBROPLASTY CERV/THOR BX INC UNI/BIL INC/INJECT/IMAGING 01/31/2017 Luanne Bras, MD [facility]  . IR GENERIC HISTORICAL  01/31/2017   IR VERTEBROPLASTY EA ADDL (T&LS) BX INC UNI/BIL INC INJECT/IMAGING 01/31/2017 Luanne Bras, MD [facility]  . kidney cancer    . NEPHRECTOMY    . SPINE SURGERY    . TUBAL LIGATION    . VIDEO ASSISTED THORACOSCOPY (VATS)/WEDGE RESECTION Right 01/18/2016   Procedure: VIDEO ASSISTED THORACOSCOPY (VATS)/LUNG RESECTION, THOROCOTOMY, RIGHT UPPER LOBECTOMY, LYMPH NODE DISSECTION, PLACEMENT OF ON Q;  Surgeon: [name], MD;  Location: [facility];  Service: Thoracic;  Laterality: Right;  Marland Kitchen VIDEO BRONCHOSCOPY Bilateral 09/20/2015   Procedure: VIDEO BRONCHOSCOPY WITHOUT FLUORO;  Surgeon: Rigoberto Noel, MD;  Location: [facility];  Service: Cardiopulmonary;  Laterality: Bilateral;  . VIDEO BRONCHOSCOPY N/A 01/18/2016   Procedure: VIDEO BRONCHOSCOPY;  Surgeon: [name], MD;  Location[facility];  Service: Thoracic;  Laterality: N/A;   Allergies  Allergen Reactions  . Bee Venom Anaphylaxis, Shortness Of Breath, Swelling and Other (See Comments)    Swelling at site   . Amlodipine Swelling and Other (See Comments)    Swelling of the ankles and hands   . Levofloxacin Other (See Comments)    Joint pain   .  Hctz [Hydrochlorothiazide] Palpitations and Other (See Comments)    Sweating    Current Outpatient Medications on File Prior to Visit  Medication Sig Dispense Refill  . acetaminophen (TYLENOL) 500 MG tablet Take 500 mg by mouth every 6 (six) hours as needed for mild pain, moderate pain, fever or headache. Reported on 05/24/2016    . albuterol (PROVENTIL) (2.5 MG/3ML) 0.083% nebulizer solution  Take 2.5 mg by nebulization every 6 (six) hours as needed for wheezing or shortness of breath.    Marland Kitchen aspirin EC 81 MG tablet Take 81 mg by mouth daily. Reported on 04/11/2016    . cholecalciferol (VITAMIN D) 1000 units tablet Take 1 tablet by mouth daily.    . fish oil-omega-3 fatty acids 1000 MG capsule Take 1 capsule by mouth daily.     . polyvinyl alcohol-povidone (REFRESH) 1.4-0.6 % ophthalmic solution Place 2 drops into both eyes daily as needed (for dry eyes). Reported on 05/24/2016    . albuterol (PROVENTIL HFA;VENTOLIN HFA) 108 (90 BASE) MCG/ACT inhaler Inhale 2 puffs into the lungs every 4 (four) hours as needed for wheezing or shortness of breath. 1 Inhaler 0   No current facility-administered medications on file prior to visit.    Social History   Socioeconomic History  . Marital status: Widowed    Spouse name: Not on file  . Number of children: 4  . Years of education: Not on file  . Highest education level: High school graduate  Social Needs  . Financial resource strain: Not hard at all  . Food insecurity - worry: Never true  . Food insecurity - inability: Never true  . Transportation needs - medical: No  . Transportation needs - non-medical: No  Occupational History  . Not on file  Tobacco Use  . Smoking status: Former Smoker    Packs/day: 0.50    Years: 50.00    Pack years: 25.00    Types: Cigarettes    Last attempt to quit: 03/16/2011    Years since quitting: 6.6  . Smokeless tobacco: Never Used  Substance and Sexual Activity  . Alcohol use: No    Alcohol/week: 0.0 oz  . Drug use: No  . Sexual activity: Not Currently  Other Topics Concern  . Not on file  Social History Narrative   Marital status: divorced; not dating in 2019.      Children: 4 children; 3 grandchildren adult; 4 gg.      Lives: alone in house      Employment: part-time work; substance abuse counselor; [employer].      Tobacco: quit smoking 2012; smoked 45 years      Alcohol: none       Drugs: none      Exercise: none in 2019; due to LEFT sciatica.      ADLs: independent with ADLs; drives.       Advanced Directives: YES: HCPOA: Nicholas Martinez/son.  FULL CODE but no prolonged measures.      Occupation: Substance Abuse Estate agent   No exercise** Merged History Encounter **       ** Data from: 12/14/11 Enc Dept: [facility]       ** Data from: 12/17/11 Enc Dept: [facility]   Substance abuse counselor   Husband deceased   4 great grandchildren   Son works in same substance abuse counseling center as patient   Family History  Problem Relation Age of Onset  .  Heart disease Sister   . Obesity Brother   . Heart attack Daughter 75       s/p CABG  . Glaucoma Daughter   . Breast cancer Sister   . Birth defects Sister   . Cancer Mother        Bladder Cancer  . Hypertension Mother   . CAD Mother 22  . Cancer Maternal Grandmother        Objective:    BP 132/88   Pulse 96   Temp 98 F (36.7 C) (Oral)   Resp 16   Ht 5\' 4"  (1.626 m)   Wt 174 lb (78.9 kg)   SpO2 93%   BMI 29.87 kg/m  Physical Exam  Constitutional: She is oriented to person, place, and time. She appears well-developed and well-nourished. No distress.  HENT:  Head: Normocephalic and atraumatic.  Right Ear: External ear normal.  Left Ear: External ear normal.  Nose: Nose normal.  Mouth/Throat: Oropharynx is clear and moist.  Eyes: Conjunctivae and EOM are normal. Pupils are equal, round, and reactive to light.  Neck: Normal range of motion and full passive range of motion without pain. Neck supple. No JVD present. Carotid bruit is not present. No thyromegaly present.  Cardiovascular: Normal rate, regular rhythm and normal heart sounds. Exam reveals no gallop and no friction rub.  No murmur heard. Pulmonary/Chest: Effort normal and breath sounds normal. She has no wheezes. She has no rales. Right breast exhibits no inverted nipple, no mass, no  nipple discharge, no skin change and no tenderness. Left breast exhibits no inverted nipple, no mass, no nipple discharge, no skin change and no tenderness. Breasts are symmetrical.  Abdominal: Soft. Bowel sounds are normal. She exhibits no distension and no mass. There is no tenderness. There is no rebound and no guarding.  Musculoskeletal:       Right shoulder: Normal.       Left shoulder: Normal.       Cervical back: Normal.       Lumbar back: She exhibits decreased range of motion and pain. She exhibits no tenderness, no spasm and normal pulse.  Lymphadenopathy:    She has no cervical adenopathy.  Neurological: She is alert and oriented to person, place, and time. She has normal reflexes. No cranial nerve deficit. She exhibits normal muscle tone. Coordination normal.  Skin: Skin is warm and dry. No rash noted. She is not diaphoretic. No erythema. No pallor.  Psychiatric: She has a normal mood and affect. Her behavior is normal. Judgment and thought content normal.  Nursing note and vitals reviewed.  No results found. Depression screen [facility] 2/9 11/13/2017 11/13/2017 03/05/2017 02/18/2017 12/26/2016  Decreased Interest 0 0 0 0 0  Down, Depressed, Hopeless 0 1 0 0 0  PHQ - 2 Score 0 1 0 0 0  Some recent data might be hidden   Fall Risk  11/13/2017 11/13/2017 03/05/2017 02/18/2017 12/26/2016  Falls in the past year? No No No No No   Functional Status Survey: Is the patient deaf or have difficulty hearing?: Yes Does the patient have difficulty seeing, even when wearing glasses/contacts?: No Does the patient have difficulty concentrating, remembering, or making decisions?: No Does the patient have difficulty walking or climbing stairs?: No Does the patient have difficulty dressing or bathing?: No Does the patient have difficulty doing errands alone such as visiting a doctor's office or shopping?: No      Assessment & Plan:   1. Essential hypertension  2. COPD GOLD II    3. Non-small cell  carcinoma of right lung, stage 2 ([place])   4. History of compression fracture of spine   5. Spondylolisthesis of lumbar region   6. Renal cell carcinoma of left kidney (HCC)   7. Reactive depression   8. Pure hypercholesterolemia   9. Adjustment insomnia   10. S/P lobectomy of lung   11. Left sided sciatica     -anticipatory guidance provided --- exercise, weight loss, safe driving practices, aspirin 81mg  daily. -obtain age appropriate screening labs and labs for chronic disease management. -moderate fall risk; no evidence of depression; no evidence of hearing loss.  Discussed advanced directives and living will; also discussed end of life issues including code status.  -worsening lower back pain with radiculopathy; obtain lumbar spine films; refer to orthopedics.  Rx for Tramadol provided for pain control. -Hypertension hypercholesterolemia controlled.  Obtain labs for chronic disease management refills provided. -COPD stable at this time without recent exacerbations. -Non-small cell carcinoma of the lung managed by Dr. Earlie Server of oncology.  Undergoing CT scans every 6 months for surveillance.  Status post chemotherapy and radiation therapy. -Trazodone for insomnia. -Intolerant to Fosamax for osteoporosis due to history of compression fractures.  We will revisit this in the future.  Consider Boniva versus Reclast therapies.  Orders Placed This Encounter  Procedures  . DG Lumbar Spine Complete    Standing Status:   Future    Number of Occurrences:   1    Standing Expiration Date:   11/13/2018    Order Specific Question:   Reason for Exam (SYMPTOM  OR DIAGNOSIS REQUIRED)    Answer:   lower back pain with radiation into LEFT leg    Order Specific Question:   Preferred imaging location?    Answer:   External  . Comprehensive metabolic panel    Order Specific Question:   Has the patient fasted?    Answer:   No  . Lipid panel    Order Specific Question:   Has the patient fasted?     Answer:   No  . Ambulatory referral to Orthopedic Surgery    Referral Priority:   Routine    Referral Type:   Surgical    Referral Reason:   Specialty Services Required    Requested Specialty:   Orthopedic Surgery    Number of Visits Requested:   1  . POCT urinalysis dipstick   Meds ordered this encounter  Medications  . atorvastatin (LIPITOR) 40 MG tablet    Sig: TAKE ONE TABLET BY MOUTH ONCE DAILY.    Dispense:  90 tablet    Refill:  3  . Fluticasone-Salmeterol (ADVAIR) 250-50 MCG/DOSE AEPB    Sig: Inhale 1 puff into the lungs 2 (two) times daily.    Dispense:  60 each    Refill:  11  . metoprolol succinate (TOPROL-XL) 50 MG 24 hr tablet    Sig: Take 1 tablet (50 mg total) by mouth daily. Take with or immediately following a meal.    Dispense:  90 tablet    Refill:  1  . traMADol (ULTRAM) 50 MG tablet    Sig: Take 1-2 tablets (50-100 mg total) by mouth every 6 (six) hours as needed for moderate pain. Reported on 05/24/2016    Dispense:  90 tablet    Refill:  2  . traZODone (DESYREL) 50 MG tablet    Sig: Take 0.5-1 tablets (25-50 mg total) by mouth at bedtime as  needed for sleep.    Dispense:  90 tablet    Refill:  1    Return in about 6 months (around 05/13/2018) for follow-up chronic medical conditions.   Kadince Boxley Elayne Guerin, M.D. Primary Care at [facility] previously [facility] [address] [place], [place]  [id number] [phone] phone [phone] fax

## 2017-11-13 NOTE — Progress Notes (Signed)
Subjective:   Tammy Ball is a 77 y.o. female who presents for Medicare Annual (Subsequent) preventive examination.  Review of Systems:  N/A Cardiac Risk Factors include: advanced age (>15men, >47 women);hypertension;dyslipidemia     Objective:     Vitals: BP 132/88   Pulse 96   Temp 98.1 F (36.7 C) (Oral)   Ht 5\' 4"  (1.626 m)   Wt 174 lb (78.9 kg)   SpO2 93%   BMI 29.87 kg/m   Body mass index is 29.87 kg/m.  Advanced Directives 11/13/2017 02/18/2017 01/31/2017 12/26/2016 12/20/2016 10/17/2016 10/09/2016  Does Patient Have a Medical Advance Directive? Yes Yes Yes Yes Yes Yes Yes  Type of Paramedic of [place];Living will [place];Living will [place];Living will [place];Living will - Healthcare Power of [place]  Does patient want to make changes to medical advance directive? - - - - No - Patient declined - -  Copy of [place] in Chart? Yes Yes No - copy requested No - copy requested - No - copy requested No - copy requested  Would patient like information on creating a medical advance directive? - - - - - - -    Tobacco Social History   Tobacco Use  Smoking Status Former Smoker  . Packs/day: 0.50  . Years: 50.00  . Pack years: 25.00  . Types: Cigarettes  . Last attempt to quit: 03/16/2011  . Years since quitting: 6.6  Smokeless Tobacco Never Used     Counseling given: Not Answered   Clinical Intake:  Pre-visit preparation completed: Yes  Pain : 0-10 Pain Score: 6  Pain Type: Chronic pain Pain Location: Back Pain Orientation: Mid Pain Descriptors / Indicators: Aching Pain Onset: More than a month ago Pain Frequency: Constant     Nutritional Status: BMI 25 -29 Overweight Nutritional Risks: None Diabetes: No  How often do you need to have someone help you when you read instructions, pamphlets, or other written materials from  your doctor or pharmacy?: 1 - Never What is the last grade level you completed in school?: High school diploma  Interpreter Needed?: No  Information entered by :: Andrez Grime, LPN  Past Medical History:  Diagnosis Date  . Anemia    was while doing chemo  . Anxiety   . Asthma   . Chemotherapy-induced neuropathy ([place]) 11/01/2015  . Claustrophobia   . COPD (chronic obstructive pulmonary disease) ([place])   . Depression   . Encounter for antineoplastic chemotherapy 02/09/2016  . Glaucoma   . Headache    prior to menopause  . Heart murmur   . History of echocardiogram    Echo 2/17: EF 59-56%, grade 1 diastolic dysfunction, mild MR, trivial pericardial effusion  . History of nuclear stress test    Myoview 2/17: no ischemia or scar, EF 79%; low risk  . History of radiation therapy 10/30/16-11/06/16  . Hyperlipidemia   . Hypertension   . Insomnia 05/16/2016  . Non-small cell carcinoma of right lung, stage 2 ([place]) 10/03/2015  . Radiation 02/29/16-04/10/16   50.4 Gy to right central chest  . Renal cell carcinoma ([place])    L nephrectomy  in 2012   Past Surgical History:  Procedure Laterality Date  . BACK SURGERY     cervical 1991  . EYE SURGERY    . IR GENERIC HISTORICAL  01/16/2017   IR RADIOLOGIST EVAL & MGMT 01/16/2017 [facility]  .  IR GENERIC HISTORICAL  01/31/2017   IR FLUORO GUIDED NEEDLE PLC ASPIRATION/INJECTION LOC 01/31/2017 Luanne Bras, MD [facility]  . IR GENERIC HISTORICAL  01/31/2017   IR VERTEBROPLASTY CERV/THOR BX INC UNI/BIL INC/INJECT/IMAGING 01/31/2017 Luanne Bras, MD [facility]  . IR GENERIC HISTORICAL  01/31/2017   IR VERTEBROPLASTY EA ADDL (T&LS) BX INC UNI/BIL INC INJECT/IMAGING 01/31/2017 Luanne Bras, MD [facility]  . kidney cancer    . NEPHRECTOMY    . SPINE SURGERY    . TUBAL LIGATION    . VIDEO ASSISTED THORACOSCOPY (VATS)/WEDGE RESECTION Right 01/18/2016   Procedure: VIDEO ASSISTED THORACOSCOPY (VATS)/LUNG RESECTION, THOROCOTOMY,  RIGHT UPPER LOBECTOMY, LYMPH NODE DISSECTION, PLACEMENT OF ON Q;  Surgeon: [name], MD;  Location: [facility];  Service: Thoracic;  Laterality: Right;  Marland Kitchen VIDEO BRONCHOSCOPY Bilateral 09/20/2015   Procedure: VIDEO BRONCHOSCOPY WITHOUT FLUORO;  Surgeon: Rigoberto Noel, MD;  Location: [facility];  Service: Cardiopulmonary;  Laterality: Bilateral;  . VIDEO BRONCHOSCOPY N/A 01/18/2016   Procedure: VIDEO BRONCHOSCOPY;  Surgeon: [name], MD;  Location: [facility] OR;  Service: Thoracic;  Laterality: N/A;   Family History  Problem Relation Age of Onset  . Heart disease Sister   . Obesity Brother   . Heart attack Daughter 58       s/p CABG  . Glaucoma Daughter   . Breast cancer Sister   . Birth defects Sister   . Cancer Mother        Bladder Cancer  . Hypertension Mother   . CAD Mother 6  . Cancer Maternal Grandmother    Social History   Socioeconomic History  . Marital status: Widowed    Spouse name: None  . Number of children: 4  . Years of education: None  . Highest education level: High school graduate  Social Needs  . Financial resource strain: Not hard at all  . Food insecurity - worry: Never true  . Food insecurity - inability: Never true  . Transportation needs - medical: No  . Transportation needs - non-medical: No  Occupational History  . None  Tobacco Use  . Smoking status: Former Smoker    Packs/day: 0.50    Years: 50.00    Pack years: 25.00    Types: Cigarettes    Last attempt to quit: 03/16/2011    Years since quitting: 6.6  . Smokeless tobacco: Never Used  Substance and Sexual Activity  . Alcohol use: No    Alcohol/week: 0.0 oz  . Drug use: No  . Sexual activity: Not Currently  Other Topics Concern  . None  Social History Narrative   Marital status: divorced; not dating.      Children: 4 children; 3 grandchildren adult; 4 gg.      Lives: alone in house      Employment: full time substance abuse counselor; [employer].      Tobacco: quit smoking  2012; smoked 45 years      Alcohol: none      Drugs: none      ADLs: independent with ADLs; drives.       Advanced Directives: YES: HCPOA: Nicholas Martinez/son.  FULL CODE but no prolonged measures.      Occupation: Substance Abuse Estate agent   No exercise** Merged History Encounter **       ** Data from: 12/14/11 Enc Dept: [facility]       ** Data from: 12/17/11 Enc Dept: [facility]   Substance abuse counselor   Husband deceased   4 great grandchildren   Son works in same substance abuse counseling center as patient    Outpatient Encounter Medications as of 11/13/2017  Medication Sig  . acetaminophen (TYLENOL) 500 MG tablet Take 500 mg by mouth every 6 (six) hours as needed for mild pain, moderate pain, fever or headache. Reported on 05/24/2016  . albuterol (PROVENTIL) (2.5 MG/3ML) 0.083% nebulizer solution Take 2.5 mg by nebulization every 6 (six) hours as needed for wheezing or shortness of breath.  Marland Kitchen aspirin EC 81 MG tablet Take 81 mg by mouth daily. Reported on 04/11/2016  . atorvastatin (LIPITOR) 40 MG tablet TAKE ONE TABLET BY MOUTH ONCE DAILY.  Marland Kitchen azelastine (ASTELIN) 0.1 % nasal spray Place 2 sprays into both nostrils 2 (two) times daily. Use in each nostril as directed  . cholecalciferol (VITAMIN D) 1000 units tablet Take 1 tablet by mouth daily.  . fish oil-omega-3 fatty acids 1000 MG capsule Take 1 capsule by mouth daily.   . fluticasone (FLONASE) 50 MCG/ACT nasal spray Place 2 sprays into both nostrils daily as needed for allergies.   . Fluticasone-Salmeterol (ADVAIR) 250-50 MCG/DOSE AEPB Inhale 1 puff into the lungs 2 (two) times daily.  . metoprolol succinate (TOPROL-XL) 50 MG 24 hr tablet TAKE 1 TABLET BY MOUTH EVERY DAY  . polyvinyl alcohol-povidone (REFRESH) 1.4-0.6 % ophthalmic solution Place 2 drops into both eyes daily as needed (for dry eyes). Reported on 05/24/2016  . temazepam (RESTORIL) 15 MG capsule Take 1 capsule (15  mg total) by mouth at bedtime as needed for sleep.  . traMADol (ULTRAM) 50 MG tablet Take 1-2 tablets (50-100 mg total) by mouth every 6 (six) hours as needed for moderate pain. Reported on 05/24/2016  . albuterol (PROVENTIL HFA;VENTOLIN HFA) 108 (90 BASE) MCG/ACT inhaler Inhale 2 puffs into the lungs every 4 (four) hours as needed for wheezing or shortness of breath.  . [DISCONTINUED] alendronate (FOSAMAX) 70 MG tablet Take 1 tablet (70 mg total) by mouth every 7 (seven) days. Take with a full glass of water on an empty stomach.  . [DISCONTINUED] doxycycline (VIBRAMYCIN) 100 MG capsule Take 1 capsule (100 mg total) by mouth 2 (two) times daily.   Facility-Administered Encounter Medications as of 11/13/2017  Medication  . levalbuterol (XOPENEX) nebulizer solution 0.63 mg    Activities of Daily Living In your present state of health, do you have any difficulty performing the following activities: 11/13/2017 01/31/2017  Hearing? Y N  Comment Patient has issues hearing her TV at home -  Vision? N N  Difficulty concentrating or making decisions? N N  Walking or climbing stairs? N N  Dressing or bathing? N N  Doing errands, shopping? N -  Preparing Food and eating ? N -  Using the Toilet? N -  In the past six months, have you accidently leaked urine? N -  Do you have problems with loss of bowel control? N -  Managing your Medications? N -  Managing your Finances? N -  Housekeeping or managing your Housekeeping? N -  Some recent data might be hidden    Patient Care Team: Wardell Honour, MD as PCP - General (Family Medicine) Curt Bears, MD as Consulting Physician (Oncology) [name], MD as Consulting Physician (Cardiothoracic Surgery)    Assessment:   This is a routine wellness examination for [place].  Exercise Activities and Dietary recommendations Current Exercise Habits: The patient does not participate  in regular exercise at present, Exercise limited by: None  identified  Goals    . Weight (lb) < 135 lb (61.2 kg)     Patient wants to lose weight and get to around 135 lbs in the near future       Fall Risk Fall Risk  11/13/2017 03/05/2017 02/18/2017 12/26/2016 12/08/2016  Falls in the past year? No No No No No   Is the patient's home free of loose throw rugs in walkways, pet beds, electrical cords, etc?   yes      Grab bars in the bathroom? yes      Handrails on the stairs?   yes      Adequate lighting?   yes  Timed Get Up and Go performed: yes, completed within 30 seconds  Depression Screen PHQ 2/9 Scores 11/13/2017 03/05/2017 02/18/2017 12/26/2016  PHQ - 2 Score 1 0 0 0     Cognitive Function     6CIT Screen 11/13/2017  What Year? 0 points  What month? 0 points  What time? 0 points  Count back from 20 0 points  Months in reverse 0 points  Repeat phrase 0 points  Total Score 0    Immunization History  Administered Date(s) Administered  . Hepatitis B 11/12/1998  . Influenza Split 09/12/2012  . Influenza, High Dose Seasonal PF 08/23/2017  . Influenza,inj,Quad PF,6+ Mos 07/18/2013  . Influenza-Unspecified 07/29/2014, 07/23/2015, 08/25/2016  . Pneumococcal Conjugate-13 07/29/2015  . Pneumococcal Polysaccharide-23 11/12/2000, 10/23/2012  . Pneumococcal-Unspecified 10/23/2012, 08/23/2017  . Tdap 12/02/2013  . Zoster 12/13/2010    Qualifies for Shingles Vaccine? Zostavax completed 12/13/2010, advised patient to check with pharmacy about receiving the Shingrix vaccine  Screening Tests Health Maintenance  Topic Date Due  . MAMMOGRAM  01/09/2018 (Originally 05/16/2017)  . COLONOSCOPY  05/09/2022  . TETANUS/TDAP  12/03/2023  . INFLUENZA VACCINE  Completed  . DEXA SCAN  Completed  . PNA vac Low Risk Adult  Completed    Cancer Screenings: Lung: Low Dose CT Chest recommended if Age 84-80 years, 30 pack-year currently smoking OR have quit w/in 15years. Patient does not qualify. Breast:  Up to date on Mammogram? No, Patient states she will  call and schedule this appointment herself.  Up to date of Bone Density/Dexa? Yes, completed 06/01/2015 Colorectal: colonoscopy completed 05/09/2012  Additional Screenings:  Hepatitis B/HIV/Syphillis: not indicated Hepatitis C Screening: not indicated      Plan:   I have personally reviewed and noted the following in the patient's chart:   . Medical and social history . Use of alcohol, tobacco or illicit drugs  . Current medications and supplements . Functional ability and status . Nutritional status . Physical activity . Advanced directives . List of other physicians . Hospitalizations, surgeries, and ER visits in previous 12 months . Vitals . Screenings to include cognitive, depression, and falls . Referrals and appointments  In addition, I have reviewed and discussed with patient certain preventive protocols, quality metrics, and best practice recommendations. A written personalized care plan for preventive services as well as general preventive health recommendations were provided to patient.   1. Encounter for Medicare annual wellness exam    Andrez Grime, LPN  [id number]/07/8118

## 2017-11-14 ENCOUNTER — Inpatient Hospital Stay: Payer: Medicare Other | Attending: Internal Medicine | Admitting: Internal Medicine

## 2017-11-14 ENCOUNTER — Encounter: Payer: Self-pay | Admitting: *Deleted

## 2017-11-14 ENCOUNTER — Encounter: Payer: Self-pay | Admitting: Internal Medicine

## 2017-11-14 ENCOUNTER — Other Ambulatory Visit: Payer: Self-pay | Admitting: *Deleted

## 2017-11-14 ENCOUNTER — Ambulatory Visit: Payer: Medicare Other

## 2017-11-14 DIAGNOSIS — C3490 Malignant neoplasm of unspecified part of unspecified bronchus or lung: Secondary | ICD-10-CM

## 2017-11-14 DIAGNOSIS — C771 Secondary and unspecified malignant neoplasm of intrathoracic lymph nodes: Secondary | ICD-10-CM

## 2017-11-14 DIAGNOSIS — C3411 Malignant neoplasm of upper lobe, right bronchus or lung: Secondary | ICD-10-CM | POA: Diagnosis not present

## 2017-11-14 DIAGNOSIS — C3491 Malignant neoplasm of unspecified part of right bronchus or lung: Secondary | ICD-10-CM

## 2017-11-14 DIAGNOSIS — R05 Cough: Secondary | ICD-10-CM

## 2017-11-14 DIAGNOSIS — R5383 Other fatigue: Secondary | ICD-10-CM | POA: Diagnosis not present

## 2017-11-14 LAB — COMPREHENSIVE METABOLIC PANEL
ALT: 15 IU/L (ref 0–32)
AST: 18 IU/L (ref 0–40)
Albumin/Globulin Ratio: 1.6 (ref 1.2–2.2)
Albumin: 4.4 g/dL (ref 3.5–4.8)
Alkaline Phosphatase: 101 IU/L (ref 39–117)
BUN/Creatinine Ratio: 16 (ref 12–28)
BUN: 19 mg/dL (ref 8–27)
Bilirubin Total: 0.4 mg/dL (ref 0.0–1.2)
CO2: 23 mmol/L (ref 20–29)
Calcium: 9.8 mg/dL (ref 8.7–10.3)
Chloride: 101 mmol/L (ref 96–106)
Creatinine, Ser: 1.19 mg/dL — ABNORMAL HIGH (ref 0.57–1.00)
GFR calc Af Amer: 52 mL/min/{1.73_m2} — ABNORMAL LOW (ref >59)
GFR calc non Af Amer: 45 mL/min/{1.73_m2} — ABNORMAL LOW (ref >59)
Globulin, Total: 2.8 g/dL (ref 1.5–4.5)
Glucose: 90 mg/dL (ref 65–99)
Potassium: 4.7 mmol/L (ref 3.5–5.2)
Sodium: 141 mmol/L (ref 134–144)
Total Protein: 7.2 g/dL (ref 6.0–8.5)

## 2017-11-14 LAB — RESEARCH LABS

## 2017-11-14 LAB — LIPID PANEL
Chol/HDL Ratio: 2.3 ratio (ref 0.0–4.4)
Cholesterol, Total: 169 mg/dL (ref 100–199)
HDL: 75 mg/dL (ref >39)
LDL Calculated: 74 mg/dL (ref 0–99)
Triglycerides: 99 mg/dL (ref 0–149)
VLDL Cholesterol Cal: 20 mg/dL (ref 5–40)

## 2017-11-14 NOTE — Progress Notes (Signed)
[place] [phone]   [phone]  OFFICE PROGRESS NOTE  Wardell Honour, MD [place] [address]  DIAGNOSIS: Stage IIIA (T2a, N2, M0) non-small cell lung cancer, squamous cell carcinoma presented with large right upper lobe lung mass with right hilar involvement diagnosed in November 2016.  PRIOR THERAPY:  1) Neoadjuvant systemic chemotherapy with carboplatin for AUC of 6 and paclitaxel 200 MG/M2 every 3 weeks, status post 3 cycles with partial response. 2) Video bronchoscopy, right video-assisted thoracoscopy, minithoracotomy, right upper lobectomy with lymph node dissection on 01/18/2016. The final pathology revealed residual squamous cell carcinoma measuring 3.1 cm with close resection margin and metastatic carcinoma to lymph nodes at levels 10R, 12R and 4R. (ypT2a, yN2).  3) Concurrent chemoradiation with weekly carboplatin for AUC of 2 and paclitaxel 45 MG/M2. First dose 02/27/2016. Status post 5 cycles. Last dose was given 03/26/2016. 4) SBRT to left lower lobe lung nodule under the care of Dr. Sondra Come completed on 11/06/2016.  CURRENT THERAPY: Observation.  INTERVAL HISTORY: Tammy Ball 77 y.o. female returns to the clinic today for follow-up visit.  The patient is feeling fine today with no specific complaints except for mild cough and fatigue mainly in the afternoon.  She denied having any recent chest pain, shortness of breath, or hemoptysis.  The patient has no recent weight loss or night sweats.  She has no nausea, vomiting, diarrhea or constipation.  She had repeat CT scan of the chest performed recently and she is here for evaluation and discussion of her scan results.  MEDICAL HISTORY: Past Medical History:  Diagnosis Date  . Anemia    was while doing chemo  . Anxiety   . Asthma   . Chemotherapy-induced neuropathy ([place]) 11/01/2015  . Claustrophobia   . COPD (chronic obstructive pulmonary disease) ([place])   .  Depression   . Encounter for antineoplastic chemotherapy 02/09/2016  . Glaucoma   . Headache    prior to menopause  . Heart murmur   . History of echocardiogram    Echo 2/17: EF 34-28%, grade 1 diastolic dysfunction, mild MR, trivial pericardial effusion  . History of nuclear stress test    Myoview 2/17: no ischemia or scar, EF 79%; low risk  . History of radiation therapy 10/30/16-11/06/16  . Hyperlipidemia   . Hypertension   . Insomnia 05/16/2016  . Non-small cell carcinoma of right lung, stage 2 ([place]) 10/03/2015  . Radiation 02/29/16-04/10/16   50.4 Gy to right central chest  . Renal cell carcinoma ([place])    L nephrectomy  in 2012    ALLERGIES:  is allergic to bee venom; amlodipine; levofloxacin; and hctz [hydrochlorothiazide].  MEDICATIONS:  Current Outpatient Medications  Medication Sig Dispense Refill  . acetaminophen (TYLENOL) 500 MG tablet Take 500 mg by mouth every 6 (six) hours as needed for mild pain, moderate pain, fever or headache. Reported on 05/24/2016    . aspirin EC 81 MG tablet Take 81 mg by mouth daily. Reported on 04/11/2016    . atorvastatin (LIPITOR) 40 MG tablet TAKE ONE TABLET BY MOUTH ONCE DAILY. 90 tablet 3  . cholecalciferol (VITAMIN D) 1000 units tablet Take 1 tablet by mouth daily.    . fish oil-omega-3 fatty acids 1000 MG capsule Take 1 capsule by mouth daily.     . Fluticasone-Salmeterol (ADVAIR) 250-50 MCG/DOSE AEPB Inhale 1 puff into the lungs 2 (two) times daily. 60 each 11  . metoprolol succinate (TOPROL-XL) 50  MG 24 hr tablet Take 1 tablet (50 mg total) by mouth daily. Take with or immediately following a meal. 90 tablet 1  . polyvinyl alcohol-povidone (REFRESH) 1.4-0.6 % ophthalmic solution Place 2 drops into both eyes daily as needed (for dry eyes). Reported on 05/24/2016    . traMADol (ULTRAM) 50 MG tablet Take 1-2 tablets (50-100 mg total) by mouth every 6 (six) hours as needed for moderate pain. Reported on 05/24/2016 90 tablet 2  . traZODone  (DESYREL) 50 MG tablet Take 0.5-1 tablets (25-50 mg total) by mouth at bedtime as needed for sleep. 90 tablet 1  . albuterol (PROVENTIL HFA;VENTOLIN HFA) 108 (90 BASE) MCG/ACT inhaler Inhale 2 puffs into the lungs every 4 (four) hours as needed for wheezing or shortness of breath. 1 Inhaler 0  . albuterol (PROVENTIL) (2.5 MG/3ML) 0.083% nebulizer solution Take 2.5 mg by nebulization every 6 (six) hours as needed for wheezing or shortness of breath.     No current facility-administered medications for this visit.     SURGICAL HISTORY:  Past Surgical History:  Procedure Laterality Date  . BACK SURGERY     cervical 1991  . EYE SURGERY    . IR GENERIC HISTORICAL  01/16/2017   IR RADIOLOGIST EVAL & MGMT 01/16/2017 [facility]  . IR GENERIC HISTORICAL  01/31/2017   IR FLUORO GUIDED NEEDLE PLC ASPIRATION/INJECTION LOC 01/31/2017 Luanne Bras, MD [facility]  . IR GENERIC HISTORICAL  01/31/2017   IR VERTEBROPLASTY CERV/THOR BX INC UNI/BIL INC/INJECT/IMAGING 01/31/2017 Luanne Bras, MD [facility]  . IR GENERIC HISTORICAL  01/31/2017   IR VERTEBROPLASTY EA ADDL (T&LS) BX INC UNI/BIL INC INJECT/IMAGING 01/31/2017 Luanne Bras, MD [facility]  . kidney cancer    . NEPHRECTOMY    . SPINE SURGERY    . TUBAL LIGATION    . VIDEO ASSISTED THORACOSCOPY (VATS)/WEDGE RESECTION Right 01/18/2016   Procedure: VIDEO ASSISTED THORACOSCOPY (VATS)/LUNG RESECTION, THOROCOTOMY, RIGHT UPPER LOBECTOMY, LYMPH NODE DISSECTION, PLACEMENT OF ON Q;  Surgeon: [name], MD;  Location: [facility];  Service: Thoracic;  Laterality: Right;  Marland Kitchen VIDEO BRONCHOSCOPY Bilateral 09/20/2015   Procedure: VIDEO BRONCHOSCOPY WITHOUT FLUORO;  Surgeon: Rigoberto Noel, MD;  Location: [facility];  Service: Cardiopulmonary;  Laterality: Bilateral;  . VIDEO BRONCHOSCOPY N/A 01/18/2016   Procedure: VIDEO BRONCHOSCOPY;  Surgeon: [name], MD;  Location: [facility];  Service: Thoracic;  Laterality: N/A;    REVIEW OF SYSTEMS:  A  comprehensive review of systems was negative except for: Constitutional: positive for fatigue Respiratory: positive for cough   PHYSICAL EXAMINATION: General appearance: alert, cooperative, fatigued and no distress Head: Normocephalic, without obvious abnormality, atraumatic Neck: no adenopathy, no JVD, supple, symmetrical, trachea midline and thyroid not enlarged, symmetric, no tenderness/mass/nodules Lymph nodes: Cervical, supraclavicular, and axillary nodes normal. Resp: clear to auscultation bilaterally Back: symmetric, no curvature. ROM normal. No CVA tenderness. Cardio: regular rate and rhythm, S1, S2 normal, no murmur, click, rub or gallop GI: soft, non-tender; bowel sounds normal; no masses,  no organomegaly Extremities: extremities normal, atraumatic, no cyanosis or edema  ECOG PERFORMANCE STATUS: 1 - Symptomatic but completely ambulatory  Blood pressure 113/70, pulse 82, temperature 97.8 F (36.6 C), temperature source Oral, resp. rate 17, height 5\' 4"  (1.626 m), weight 175 lb 8 oz (79.6 kg), SpO2 97 %.  LABORATORY DATA: Lab Results  Component Value Date   WBC 6.6 11/11/2017   HGB 13.7 11/11/2017   HCT 41.9 11/11/2017   MCV 97.9 11/11/2017   PLT  282 11/11/2017      Chemistry      Component Value Date/Time   NA 141 11/13/2017 1659   NA 140 11/11/2017 0939   K 4.7 11/13/2017 1659   K 4.6 11/11/2017 0939   CL 101 11/13/2017 1659   CO2 23 11/13/2017 1659   CO2 28 11/11/2017 0939   BUN 19 11/13/2017 1659   BUN 30.3 (H) 11/11/2017 0939   CREATININE 1.19 (H) 11/13/2017 1659   CREATININE 1.3 (H) 11/11/2017 0939   GLU 105 12/10/2009      Component Value Date/Time   CALCIUM 9.8 11/13/2017 1659   CALCIUM 10.0 11/11/2017 0939   ALKPHOS 101 11/13/2017 1659   ALKPHOS 102 11/11/2017 0939   AST 18 11/13/2017 1659   AST 18 11/11/2017 0939   ALT 15 11/13/2017 1659   ALT 19 11/11/2017 0939   BILITOT 0.4 11/13/2017 1659   BILITOT 0.42 11/11/2017 0939        RADIOGRAPHIC STUDIES: Dg Lumbar Spine Complete  Result Date: 11/13/2017 CLINICAL DATA:  Low back pain with radiation into left leg. EXAM: LUMBAR SPINE - COMPLETE 4+ VIEW COMPARISON:  Plain film of the lumbar spine dated 10/02/2016. FINDINGS: Again noted are mild disc desiccations at the L4-5 and L5-S1 levels, stable. Again noted is the prominent degenerative facet hypertrophy within the lower lumbar spine, stable. Associated 7 mm anterolisthesis of L4 on L5 is stable. No acute or suspicious osseous finding. Atherosclerotic changes noted along the walls of the infrarenal abdominal aorta. IMPRESSION: 1. Stable exam. Degenerative changes within the lower lumbar spine, as detailed above. Stable anterolisthesis of L4 on L5 is stable. 2. No acute findings. 3. Aortic atherosclerosis. Electronically Signed   By: Franki Cabot M.D.   On: 11/13/2017 17:56   Ct Chest W Contrast  Result Date: 11/11/2017 CLINICAL DATA:  Followup lung cancer EXAM: CT CHEST WITH CONTRAST TECHNIQUE: Multidetector CT imaging of the chest was performed during intravenous contrast administration. CONTRAST:  5mL ISOVUE-300 IOPAMIDOL (ISOVUE-300) INJECTION 61% COMPARISON:  05/06/2017 FINDINGS: Cardiovascular: The heart size is normal. Aortic atherosclerosis identified. Calcification in the LAD coronary artery noted. No pericardial effusion. Mediastinum/Nodes: The trachea appears patent and is midline. Normal appearance of the esophagus. No enlarged mediastinal or hilar lymph nodes. No axillary or supraclavicular adenopathy. Lungs/Pleura: No pleural effusion. Moderate changes of emphysema. Postsurgical changes from right upper lobectomy. No suspicious nodule or mass within the right hemithorax to suggest local tumor recurrence. There is a focal progressive consolidative process with surrounding ground-glass attenuation and architectural disc within the posterior left base which has a central component measuring 3.1 cm, image 112 of series  7. Favor progressive changes of external beam radiation. Upper Abdomen: No acute abnormality. Musculoskeletal: Postsurgical change from vertebroplasty in T3 and T4. Stable mild T6 vertebral compression fracture. No aggressive lytic or sclerotic bone lesions. IMPRESSION: 1. Stable postsurgical changes from right upper lobectomy. No evidence for local tumor recurrence. 2. Progressive consolidative process with surrounding ground-glass attenuation and architectural distortion in the posterior left base. Favor changes secondary to external beam radiation. Attention this area on follow-up exam is advised. 3. Stable appearance of treated compression deformities involving T3 and T4. Unchanged appearance of untreated T6 compression fracture. 4. Aortic Atherosclerosis (ICD10-I70.0) and Emphysema (ICD10-J43.9). 5. Coronary artery calcifications. Electronically Signed   By: Kerby Moors M.D.   On: 11/11/2017 13:26    ASSESSMENT AND PLAN:  This is a very pleasant 77 years old white female with a stage IIIa non-small cell lung cancer,  squamous cell carcinoma status post neoadjuvant systemic chemotherapy was carboplatin and paclitaxel with partial response followed by right upper lobectomy and lymph node dissection. She was found to have evidence of metastatic disease to the mediastinal lymph nodes and close resection margin. The patient underwent a course of concurrent chemoradiation with weekly carboplatin and paclitaxel. She was also treated with SBRT left lower lobe pulmonary nodule under the care of Dr. Sondra Come. The patient continues to do fine with no concerning complaints except for mild fatigue and cough. Repeat CT scan of the chest performed recently showed no concerning findings for disease progression. I discussed the scan results with the patient and recommended for her to continue in observation with repeat CT scan of the chest in 6 months. She was advised to call immediately if she has any concerning  symptoms in the interval. The patient voices understanding of current disease status and treatment options and is in agreement with the current care plan. All questions were answered. The patient knows to call the clinic with any problems, questions or concerns. We can certainly see the patient much sooner if necessary. I spent 10 minutes counseling the patient face to face. The total time spent in the appointment was 15 minutes.  Disclaimer: This note was dictated with voice recognition software. Similar sounding words can inadvertently be transcribed and may not be corrected upon review.

## 2017-11-20 ENCOUNTER — Telehealth: Payer: Self-pay | Admitting: Internal Medicine

## 2017-11-20 NOTE — Telephone Encounter (Signed)
Called regarding july

## 2017-12-06 ENCOUNTER — Encounter: Payer: Self-pay | Admitting: Family Medicine

## 2018-01-09 DIAGNOSIS — M810 Age-related osteoporosis without current pathological fracture: Secondary | ICD-10-CM | POA: Diagnosis not present

## 2018-01-09 DIAGNOSIS — M5136 Other intervertebral disc degeneration, lumbar region: Secondary | ICD-10-CM | POA: Diagnosis not present

## 2018-01-09 DIAGNOSIS — M546 Pain in thoracic spine: Secondary | ICD-10-CM | POA: Diagnosis not present

## 2018-01-16 DIAGNOSIS — M8589 Other specified disorders of bone density and structure, multiple sites: Secondary | ICD-10-CM | POA: Diagnosis not present

## 2018-01-23 ENCOUNTER — Encounter: Payer: Self-pay | Admitting: Urgent Care

## 2018-01-23 ENCOUNTER — Inpatient Hospital Stay (HOSPITAL_COMMUNITY)
Admission: EM | Admit: 2018-01-23 | Discharge: 2018-01-25 | DRG: 193 | Disposition: A | Discharge status: Home / Self Care | Payer: Medicare Other | Attending: Family Medicine | Admitting: Family Medicine

## 2018-01-23 ENCOUNTER — Other Ambulatory Visit: Payer: Self-pay

## 2018-01-23 ENCOUNTER — Ambulatory Visit (INDEPENDENT_AMBULATORY_CARE_PROVIDER_SITE_OTHER): Payer: Medicare Other

## 2018-01-23 ENCOUNTER — Ambulatory Visit (INDEPENDENT_AMBULATORY_CARE_PROVIDER_SITE_OTHER): Payer: Medicare Other | Admitting: Urgent Care

## 2018-01-23 VITALS — BP 135/57 | HR 103 | Temp 99.7°F | Resp 18 | Ht 64.0 in | Wt 172.2 lb

## 2018-01-23 DIAGNOSIS — Z85528 Personal history of other malignant neoplasm of kidney: Secondary | ICD-10-CM

## 2018-01-23 DIAGNOSIS — Z923 Personal history of irradiation: Secondary | ICD-10-CM

## 2018-01-23 DIAGNOSIS — R05 Cough: Secondary | ICD-10-CM

## 2018-01-23 DIAGNOSIS — R059 Cough, unspecified: Secondary | ICD-10-CM

## 2018-01-23 DIAGNOSIS — N179 Acute kidney failure, unspecified: Secondary | ICD-10-CM | POA: Diagnosis present

## 2018-01-23 DIAGNOSIS — J101 Influenza due to other identified influenza virus with other respiratory manifestations: Principal | ICD-10-CM | POA: Diagnosis present

## 2018-01-23 DIAGNOSIS — B029 Zoster without complications: Secondary | ICD-10-CM | POA: Diagnosis present

## 2018-01-23 DIAGNOSIS — J441 Chronic obstructive pulmonary disease with (acute) exacerbation: Secondary | ICD-10-CM | POA: Diagnosis present

## 2018-01-23 DIAGNOSIS — Z85118 Personal history of other malignant neoplasm of bronchus and lung: Secondary | ICD-10-CM

## 2018-01-23 DIAGNOSIS — Z803 Family history of malignant neoplasm of breast: Secondary | ICD-10-CM

## 2018-01-23 DIAGNOSIS — Z8249 Family history of ischemic heart disease and other diseases of the circulatory system: Secondary | ICD-10-CM

## 2018-01-23 DIAGNOSIS — J449 Chronic obstructive pulmonary disease, unspecified: Secondary | ICD-10-CM | POA: Diagnosis present

## 2018-01-23 DIAGNOSIS — Z902 Acquired absence of lung [part of]: Secondary | ICD-10-CM | POA: Diagnosis not present

## 2018-01-23 DIAGNOSIS — Z7951 Long term (current) use of inhaled steroids: Secondary | ICD-10-CM

## 2018-01-23 DIAGNOSIS — Z905 Acquired absence of kidney: Secondary | ICD-10-CM

## 2018-01-23 DIAGNOSIS — R0602 Shortness of breath: Secondary | ICD-10-CM

## 2018-01-23 DIAGNOSIS — R0902 Hypoxemia: Secondary | ICD-10-CM | POA: Diagnosis not present

## 2018-01-23 DIAGNOSIS — E785 Hyperlipidemia, unspecified: Secondary | ICD-10-CM | POA: Diagnosis present

## 2018-01-23 DIAGNOSIS — Z8052 Family history of malignant neoplasm of bladder: Secondary | ICD-10-CM

## 2018-01-23 DIAGNOSIS — J9601 Acute respiratory failure with hypoxia: Secondary | ICD-10-CM

## 2018-01-23 DIAGNOSIS — Z7982 Long term (current) use of aspirin: Secondary | ICD-10-CM

## 2018-01-23 DIAGNOSIS — Z87891 Personal history of nicotine dependence: Secondary | ICD-10-CM | POA: Diagnosis not present

## 2018-01-23 DIAGNOSIS — R5383 Other fatigue: Secondary | ICD-10-CM | POA: Diagnosis not present

## 2018-01-23 DIAGNOSIS — J111 Influenza due to unidentified influenza virus with other respiratory manifestations: Secondary | ICD-10-CM | POA: Diagnosis not present

## 2018-01-23 DIAGNOSIS — J45909 Unspecified asthma, uncomplicated: Secondary | ICD-10-CM

## 2018-01-23 DIAGNOSIS — I10 Essential (primary) hypertension: Secondary | ICD-10-CM | POA: Diagnosis present

## 2018-01-23 DIAGNOSIS — R062 Wheezing: Secondary | ICD-10-CM | POA: Diagnosis not present

## 2018-01-23 LAB — CBC WITH DIFFERENTIAL/PLATELET
Basophils Absolute: 0 10*3/uL (ref 0.0–0.1)
Basophils Relative: 0 %
Eosinophils Absolute: 0 10*3/uL (ref 0.0–0.7)
Eosinophils Relative: 0 %
HCT: 39.3 % (ref 36.0–46.0)
Hemoglobin: 13.1 g/dL (ref 12.0–15.0)
Lymphocytes Relative: 9 %
Lymphs Abs: 1.2 10*3/uL (ref 0.7–4.0)
MCH: 31.8 pg (ref 26.0–34.0)
MCHC: 33.3 g/dL (ref 30.0–36.0)
MCV: 95.4 fL (ref 78.0–100.0)
Monocytes Absolute: 0.9 10*3/uL (ref 0.1–1.0)
Monocytes Relative: 7 %
Neutro Abs: 11.3 10*3/uL — ABNORMAL HIGH (ref 1.7–7.7)
Neutrophils Relative %: 84 %
Platelets: 266 10*3/uL (ref 150–400)
RBC: 4.12 MIL/uL (ref 3.87–5.11)
RDW: 15.4 % (ref 11.5–15.5)
WBC: 13.4 10*3/uL — ABNORMAL HIGH (ref 4.0–10.5)

## 2018-01-23 LAB — COMPREHENSIVE METABOLIC PANEL
ALT: 15 U/L (ref 14–54)
AST: 19 U/L (ref 15–41)
Albumin: 3.8 g/dL (ref 3.5–5.0)
Alkaline Phosphatase: 90 U/L (ref 38–126)
Anion gap: 11 (ref 5–15)
BUN: 17 mg/dL (ref 6–20)
CO2: 29 mmol/L (ref 22–32)
Calcium: 9.2 mg/dL (ref 8.9–10.3)
Chloride: 98 mmol/L — ABNORMAL LOW (ref 101–111)
Creatinine, Ser: 1.41 mg/dL — ABNORMAL HIGH (ref 0.44–1.00)
GFR calc Af Amer: 41 mL/min — ABNORMAL LOW (ref >60)
GFR calc non Af Amer: 36 mL/min — ABNORMAL LOW (ref >60)
Glucose, Bld: 113 mg/dL — ABNORMAL HIGH (ref 65–99)
Potassium: 4.2 mmol/L (ref 3.5–5.1)
Sodium: 138 mmol/L (ref 135–145)
Total Bilirubin: 0.7 mg/dL (ref 0.3–1.2)
Total Protein: 8 g/dL (ref 6.5–8.1)

## 2018-01-23 LAB — POCT INFLUENZA A/B
Influenza A, POC: POSITIVE — AB
Influenza B, POC: NEGATIVE

## 2018-01-23 LAB — BRAIN NATRIURETIC PEPTIDE: B Natriuretic Peptide: 156.9 pg/mL — ABNORMAL HIGH (ref 0.0–100.0)

## 2018-01-23 LAB — I-STAT TROPONIN, ED: Troponin i, poc: 0.02 ng/mL (ref 0.00–0.08)

## 2018-01-23 MED ORDER — METOPROLOL SUCCINATE ER 50 MG PO TB24
50.0000 mg | ORAL_TABLET | Freq: Every day | ORAL | Status: DC
  Administered 2018-01-24 – 2018-01-25 (×2): 50 mg via ORAL
  Filled 2018-01-23 (×2): qty 1

## 2018-01-23 MED ORDER — ACETAMINOPHEN 500 MG PO TABS: 500.0000 mg | ORAL_TABLET | Freq: Four times a day (QID) | ORAL | Status: DC | PRN

## 2018-01-23 MED ORDER — PREDNISONE 20 MG PO TABS
40.0000 mg | ORAL_TABLET | Freq: Every day | ORAL | Status: DC
  Administered 2018-01-24 – 2018-01-25 (×2): 40 mg via ORAL
  Filled 2018-01-23 (×2): qty 2

## 2018-01-23 MED ORDER — SODIUM CHLORIDE 0.9% FLUSH: 3.0000 mL | INTRAVENOUS | Status: DC | PRN

## 2018-01-23 MED ORDER — POLYETHYLENE GLYCOL 3350 17 G PO PACK: 17.0000 g | PACK | Freq: Every day | ORAL | Status: DC | PRN

## 2018-01-23 MED ORDER — SENNA 8.6 MG PO TABS
1.0000 | ORAL_TABLET | Freq: Two times a day (BID) | ORAL | Status: DC
  Administered 2018-01-23 – 2018-01-25 (×4): 8.6 mg via ORAL
  Filled 2018-01-23 (×4): qty 1

## 2018-01-23 MED ORDER — FAMOTIDINE 20 MG PO TABS
20.0000 mg | ORAL_TABLET | Freq: Two times a day (BID) | ORAL | Status: DC
  Administered 2018-01-23 – 2018-01-25 (×4): 20 mg via ORAL
  Filled 2018-01-23 (×4): qty 1

## 2018-01-23 MED ORDER — ALBUTEROL SULFATE (2.5 MG/3ML) 0.083% IN NEBU
2.5000 mg | INHALATION_SOLUTION | Freq: Once | RESPIRATORY_TRACT | Status: AC
  Administered 2018-01-23: 2.5 mg via RESPIRATORY_TRACT

## 2018-01-23 MED ORDER — ACETAMINOPHEN 325 MG PO TABS
650.0000 mg | ORAL_TABLET | Freq: Once | ORAL | Status: AC
  Administered 2018-01-23: 650 mg via ORAL
  Filled 2018-01-23: qty 2

## 2018-01-23 MED ORDER — HYDRALAZINE HCL 20 MG/ML IJ SOLN: 10.0000 mg | Freq: Four times a day (QID) | INTRAMUSCULAR | Status: DC | PRN

## 2018-01-23 MED ORDER — FLUTICASONE FUROATE-VILANTEROL 200-25 MCG/INH IN AEPB
1.0000 | INHALATION_SPRAY | Freq: Every day | RESPIRATORY_TRACT | Status: DC
  Administered 2018-01-24 – 2018-01-25 (×2): 1 via RESPIRATORY_TRACT
  Filled 2018-01-23: qty 28

## 2018-01-23 MED ORDER — ACETAMINOPHEN 325 MG PO TABS
650.0000 mg | ORAL_TABLET | Freq: Four times a day (QID) | ORAL | Status: DC | PRN
  Administered 2018-01-24 – 2018-01-25 (×2): 650 mg via ORAL
  Filled 2018-01-23 (×2): qty 2

## 2018-01-23 MED ORDER — ONDANSETRON HCL 4 MG/2ML IJ SOLN: 4.0000 mg | Freq: Four times a day (QID) | INTRAMUSCULAR | Status: DC | PRN

## 2018-01-23 MED ORDER — ACETAMINOPHEN 650 MG RE SUPP: 650.0000 mg | Freq: Four times a day (QID) | RECTAL | Status: DC | PRN

## 2018-01-23 MED ORDER — ADULT MULTIVITAMIN W/MINERALS CH
1.0000 | ORAL_TABLET | Freq: Every day | ORAL | Status: DC
  Administered 2018-01-24 – 2018-01-25 (×2): 1 via ORAL
  Filled 2018-01-23 (×2): qty 1

## 2018-01-23 MED ORDER — OMEGA-3-ACID ETHYL ESTERS 1 G PO CAPS
1.0000 | ORAL_CAPSULE | Freq: Every day | ORAL | Status: DC
  Administered 2018-01-24 – 2018-01-25 (×2): 1 g via ORAL
  Filled 2018-01-23 (×3): qty 1

## 2018-01-23 MED ORDER — GUAIFENESIN-DM 100-10 MG/5ML PO SYRP
5.0000 mL | ORAL_SOLUTION | ORAL | Status: DC | PRN
  Administered 2018-01-23 – 2018-01-25 (×4): 5 mL via ORAL
  Filled 2018-01-23 (×4): qty 10

## 2018-01-23 MED ORDER — OSELTAMIVIR PHOSPHATE 75 MG PO CAPS
75.0000 mg | ORAL_CAPSULE | Freq: Once | ORAL | Status: AC
  Administered 2018-01-23: 75 mg via ORAL
  Filled 2018-01-23: qty 1

## 2018-01-23 MED ORDER — GUAIFENESIN ER 600 MG PO TB12
600.0000 mg | ORAL_TABLET | Freq: Two times a day (BID) | ORAL | Status: DC
  Administered 2018-01-23 – 2018-01-25 (×4): 600 mg via ORAL
  Filled 2018-01-23 (×4): qty 1

## 2018-01-23 MED ORDER — SODIUM CHLORIDE 0.9% FLUSH
3.0000 mL | Freq: Two times a day (BID) | INTRAVENOUS | Status: DC
  Administered 2018-01-23 – 2018-01-24 (×2): 3 mL via INTRAVENOUS

## 2018-01-23 MED ORDER — ONDANSETRON HCL 4 MG PO TABS: 4.0000 mg | ORAL_TABLET | Freq: Four times a day (QID) | ORAL | Status: DC | PRN

## 2018-01-23 MED ORDER — IPRATROPIUM-ALBUTEROL 0.5-2.5 (3) MG/3ML IN SOLN
3.0000 mL | Freq: Four times a day (QID) | RESPIRATORY_TRACT | Status: DC
  Administered 2018-01-23 – 2018-01-25 (×6): 3 mL via RESPIRATORY_TRACT
  Filled 2018-01-23 (×7): qty 3

## 2018-01-23 MED ORDER — IPRATROPIUM-ALBUTEROL 0.5-2.5 (3) MG/3ML IN SOLN
3.0000 mL | Freq: Once | RESPIRATORY_TRACT | Status: AC
  Administered 2018-01-23: 3 mL via RESPIRATORY_TRACT
  Filled 2018-01-23: qty 3

## 2018-01-23 MED ORDER — SODIUM CHLORIDE 0.9 % IV SOLN
1.0000 g | Freq: Once | INTRAVENOUS | Status: AC
  Administered 2018-01-23: 1 g via INTRAVENOUS
  Filled 2018-01-23: qty 10

## 2018-01-23 MED ORDER — LEVALBUTEROL HCL 0.63 MG/3ML IN NEBU
1.2500 mg | INHALATION_SOLUTION | Freq: Once | RESPIRATORY_TRACT | Status: AC
  Administered 2018-01-23: 1.25 mg via RESPIRATORY_TRACT

## 2018-01-23 MED ORDER — IPRATROPIUM BROMIDE 0.02 % IN SOLN
0.5000 mg | Freq: Once | RESPIRATORY_TRACT | Status: AC
  Administered 2018-01-23: 0.5 mg via RESPIRATORY_TRACT

## 2018-01-23 MED ORDER — HEPARIN SODIUM (PORCINE) 5000 UNIT/ML IJ SOLN
5000.0000 [IU] | Freq: Three times a day (TID) | INTRAMUSCULAR | Status: DC
  Administered 2018-01-23 – 2018-01-25 (×5): 5000 [IU] via SUBCUTANEOUS
  Filled 2018-01-23 (×5): qty 1

## 2018-01-23 MED ORDER — ALBUTEROL SULFATE (2.5 MG/3ML) 0.083% IN NEBU
2.5000 mg | INHALATION_SOLUTION | RESPIRATORY_TRACT | Status: DC | PRN
  Administered 2018-01-24 – 2018-01-25 (×2): 2.5 mg via RESPIRATORY_TRACT
  Filled 2018-01-23 (×2): qty 3

## 2018-01-23 MED ORDER — METHYLPREDNISOLONE SODIUM SUCC 125 MG IJ SOLR
125.0000 mg | Freq: Once | INTRAMUSCULAR | Status: AC
  Administered 2018-01-23: 125 mg via INTRAVENOUS
  Filled 2018-01-23: qty 2

## 2018-01-23 MED ORDER — TRAZODONE HCL 50 MG PO TABS
50.0000 mg | ORAL_TABLET | Freq: Every evening | ORAL | Status: DC | PRN
  Filled 2018-01-23: qty 1

## 2018-01-23 MED ORDER — VITAMIN D3 25 MCG (1000 UNIT) PO TABS
1000.0000 [IU] | ORAL_TABLET | Freq: Every day | ORAL | Status: DC
  Administered 2018-01-24 – 2018-01-25 (×2): 1000 [IU] via ORAL
  Filled 2018-01-23 (×2): qty 1

## 2018-01-23 MED ORDER — SODIUM CHLORIDE 0.9 % IV SOLN: 250.0000 mL | INTRAVENOUS | Status: DC | PRN

## 2018-01-23 MED ORDER — ONDANSETRON HCL 4 MG/2ML IJ SOLN
4.0000 mg | Freq: Once | INTRAMUSCULAR | Status: AC
  Administered 2018-01-23: 4 mg via INTRAVENOUS
  Filled 2018-01-23: qty 2

## 2018-01-23 MED ORDER — OSELTAMIVIR PHOSPHATE 30 MG PO CAPS
30.0000 mg | ORAL_CAPSULE | Freq: Two times a day (BID) | ORAL | Status: DC
  Administered 2018-01-23 – 2018-01-25 (×4): 30 mg via ORAL
  Filled 2018-01-23 (×4): qty 1

## 2018-01-23 MED ORDER — SODIUM CHLORIDE 0.9 % IV SOLN
INTRAVENOUS | Status: DC
  Administered 2018-01-23 – 2018-01-25 (×5): via INTRAVENOUS

## 2018-01-23 NOTE — Patient Instructions (Addendum)
We are sending you to the ER via ambulance for management of respiratory failure due to your hypoxia in setting of COPD and new onset influenza.    Hypoxia Hypoxia is a condition that happens when there is a lack of oxygen in the body's tissues and organs. When there is not enough oxygen, organs cannot work as they should. This causes serious problems throughout the body and in the brain. What are the causes? This condition may be caused by:  Exposure to high altitude.  A collapsed lung (pneumothorax).  Lung infection (pneumonia).  Lung injury.  Long-term (chronic) lung disease, such as COPD (chronic obstructive pulmonary disease).  Blood collecting in the chest cavity (hemothorax).  Food, saliva, or vomit getting into the airway (aspiration).  Reduced blood flow (ischemia).  Severe blood loss.  Slow or shallow breathing (hypoventilation).  Blood disorders, such as anemia.  Carbon monoxide poisoning.  The heart suddenly stopping (cardiac arrest).  Anesthetic medicines.  Drowning.  Choking.  What are the signs or symptoms? Symptoms of this condition include:  Headache.  Fatigue.  Drowsiness.  Forgetfulness.  Nausea.  Confusion.  Shortness of breath.  Dizziness.  Bluish color of the skin, lips, or nail beds (cyanosis).  Change in consciousness or awareness.  If hypoxia is not treated, it can lead to convulsions, loss of consciousness (coma), or brain damage. How is this diagnosed? This condition may be diagnosed based on:  A physical exam.  Blood tests.  A test that measures how much oxygen is in your blood (pulse oximetry). This is done with a sensor that is placed on your finger, toe, or earlobe.  Chest X-ray.  Tests to check your lung function (pulmonary function tests).  A test to check the electrical activity of your heart (electrocardiogram, ECG).  You may have other tests to determine the cause of your hypoxia. How is this  treated? Treatment for this condition depends on what is causing the hypoxia. You will likely be treated with oxygen therapy. This may be done by giving you oxygen through a face mask or through tubes in your nose. Your health care provider may also recommend other therapies to treat the underlying cause of your hypoxia. Follow these instructions at home:  Take over-the-counter and prescription medicines only as told by your health care provider.  Do not use any products that contain nicotine or tobacco, such as cigarettes and e-cigarettes. If you need help quitting, ask your health care provider.  Avoid secondhand smoke.  Work with your health care provider to manage any chronic conditions you have that may be causing hypoxia, such as COPD.  Keep all follow-up visits as told by your health care provider. This is important. Contact a health care provider if:  You have a fever.  You have trouble breathing, even after treatment.  You become extremely short of breath when you exercise. Get help right away if:  Your shortness of breath gets worse, especially with normal or very little activity.  Your skin, lips, or nail beds have a bluish color.  You become confused or you cannot think properly.  You have chest pain. Summary  Hypoxia is a condition that happens when there is a lack of oxygen in the body's tissues and organs.  If hypoxia is not treated, it can lead to convulsions, loss of consciousness (coma), or brain damage.  Symptoms of hypoxia can include a headache, shortness of breath, confusion, nausea, and a bluish skin color.  Hypoxia has many possible causes,  including exposure to high altitude, carbon monoxide poisoning, or other health issues, such as blood disorders or cardiac arrest.  Hypoxia is usually treated with oxygen therapy. This information is not intended to replace advice given to you by your health care provider. Make sure you discuss any questions you  have with your health care provider. Document Released: 12/17/2016 Document Revised: 12/17/2016 Document Reviewed: 12/17/2016 Elsevier Interactive Patient Education  2018 [employer].    Influenza, Adult Influenza, more commonly known as "the flu," is a viral infection that primarily affects the respiratory tract. The respiratory tract includes organs that help you breathe, such as the lungs, nose, and throat. The flu causes many common cold symptoms, as well as a high fever and body aches. The flu spreads easily from person to person (is contagious). Getting a flu shot (influenza vaccination) every year is the best way to prevent influenza. What are the causes? Influenza is caused by a virus. You can catch the virus by:  Breathing in droplets from an infected person's cough or sneeze.  Touching something that was recently contaminated with the virus and then touching your mouth, nose, or eyes.  What increases the risk? The following factors may make you more likely to get the flu:  Not cleaning your hands frequently with soap and water or alcohol-based hand sanitizer.  Having close contact with many people during cold and flu season.  Touching your mouth, eyes, or nose without washing or sanitizing your hands first.  Not drinking enough fluids or not eating a healthy diet.  Not getting enough sleep or exercise.  Being under a high amount of stress.  Not getting a yearly (annual) flu shot.  You may be at a higher risk of complications from the flu, such as a severe lung infection (pneumonia), if you:  Are over the age of 31.  Are pregnant.  Have a weakened disease-fighting system (immune system). You may have a weakened immune system if you: ? Have HIV or AIDS. ? Are undergoing chemotherapy. ? Aretaking medicines that reduce the activity of (suppress) the immune system.  Have a long-term (chronic) illness, such as heart disease, kidney disease, diabetes, or lung  disease.  Have a liver disorder.  Are obese.  Have anemia.  What are the signs or symptoms? Symptoms of this condition typically last 4-10 days and may include:  Fever.  Chills.  Headache, body aches, or muscle aches.  Sore throat.  Cough.  Runny or congested nose.  Chest discomfort and cough.  Poor appetite.  Weakness or tiredness (fatigue).  Dizziness.  Nausea or vomiting.  How is this diagnosed? This condition may be diagnosed based on your medical history and a physical exam. Your health care provider may do a nose or throat swab test to confirm the diagnosis. How is this treated? If influenza is detected early, you can be treated with antiviral medicine that can reduce the length of your illness and the severity of your symptoms. This medicine may be given by mouth (orally) or through an IV tube that is inserted in one of your veins. The goal of treatment is to relieve symptoms by taking care of yourself at home. This may include taking over-the-counter medicines, drinking plenty of fluids, and adding humidity to the air in your home. In some cases, influenza goes away on its own. Severe influenza or complications from influenza may be treated in a hospital. Follow these instructions at home:  Take over-the-counter and prescription medicines only as told  by your health care provider.  Use a cool mist humidifier to add humidity to the air in your home. This can make breathing easier.  Rest as needed.  Drink enough fluid to keep your urine clear or pale yellow.  Cover your mouth and nose when you cough or sneeze.  Wash your hands with soap and water often, especially after you cough or sneeze. If soap and water are not available, use hand sanitizer.  Stay home from work or school as told by your health care provider. Unless you are visiting your health care provider, try to avoid leaving home until your fever has been gone for 24 hours without the use of  medicine.  Keep all follow-up visits as told by your health care provider. This is important. How is this prevented?  Getting an annual flu shot is the best way to avoid getting the flu. You may get the flu shot in late summer, fall, or winter. Ask your health care provider when you should get your flu shot.  Wash your hands often or use hand sanitizer often.  Avoid contact with people who are sick during cold and flu season.  Eat a healthy diet, drink plenty of fluids, get enough sleep, and exercise regularly. Contact a health care provider if:  You develop new symptoms.  You have: ? Chest pain. ? Diarrhea. ? A fever.  Your cough gets worse.  You produce more mucus.  You feel nauseous or you vomit. Get help right away if:  You develop shortness of breath or difficulty breathing.  Your skin or nails turn a bluish color.  You have severe pain or stiffness in your neck.  You develop a sudden headache or sudden pain in your face or ear.  You cannot stop vomiting. This information is not intended to replace advice given to you by your health care provider. Make sure you discuss any questions you have with your health care provider. Document Released: 10/26/2000 Document Revised: 04/05/2016 Document Reviewed: 08/23/2015 Elsevier Interactive Patient Education  2017 [employer].    IF you received an x-ray today, you will receive an invoice from [facility]. Please contact [facility] at [phone] with questions or concerns regarding your invoice.   IF you received labwork today, you will receive an invoice from [facility]. Please contact [facility] at [phone] with questions or concerns regarding your invoice.   Our billing staff will not be able to assist you with questions regarding bills from these companies.  You will be contacted with the lab results as soon as they are available. The fastest way to get your results is to activate your My Chart  account. Instructions are located on the last page of this paperwork. If you have not heard from [place] regarding the results in 2 weeks, please contact this office.

## 2018-01-23 NOTE — Progress Notes (Signed)
Patient reports that she has the shingles and was diagnosed approx. 1 week ago. She has a rash on her right upper back area. Scabbed over, nothing draining. Jeannette Corpus, NP made aware and stated there was not a need for her to be in a negative pressure room.

## 2018-01-23 NOTE — ED Triage Notes (Signed)
Pt comes from home. Pt arrived via [facility] from PCP. Pt does have hx of COPD and Lung cancer. Pt is Flu positive according to PCP. Pt is not febrile. Pt Still works full time and just stated that pt feels run down and feels much better with 2L nasal canula. Denies Chest pain or any other symptoms. Pt is AOx4 and ambulatory.

## 2018-01-23 NOTE — Progress Notes (Signed)
MRN: [id number] DOB: 03/25/1941  Subjective:   Tammy Ball is a 77 y.o. female presenting for 4 day history of productive cough with hemoptysis, fever (highest was 101.20F today), body aches, sweating. Cough elicits chest pain, headache, shob, wheezing. Patient has a history of COPD, uses Advair daily. Has used albuterol inhaler just once in the past 6 months. Denies smoking cigarettes. Has had [place] diagnosed in 2016, has been in remission since 10/2016. Had follow up with her oncologist 11/2017 and plan was to follow up in 05/2018. Flu shot administered 08/23/2017.  Tammy Ball has a current medication list which includes the following prescription(s): acetaminophen, albuterol, aspirin ec, atorvastatin, cholecalciferol, fish oil-omega-3 fatty acids, fluticasone-salmeterol, metoprolol succinate, trazodone, and albuterol. Also is allergic to bee venom; amlodipine; levofloxacin; and hctz [hydrochlorothiazide].  Tammy Ball  has a past medical history of Anemia, Anxiety, Asthma, Chemotherapy-induced neuropathy ([place]) (11/01/2015), Claustrophobia, COPD (chronic obstructive pulmonary disease) ([place]), Depression, Encounter for antineoplastic chemotherapy (02/09/2016), Glaucoma, Headache, Heart murmur, History of echocardiogram, History of nuclear stress test, History of radiation therapy (10/30/16-11/06/16), Hyperlipidemia, Hypertension, Insomnia (05/16/2016), Non-small cell carcinoma of right lung, stage 2 ([place]) (10/03/2015), Radiation (02/29/16-04/10/16), and Renal cell carcinoma ([place]). Also  has a past surgical history that includes Nephrectomy; kidney cancer; Tubal ligation; Spine surgery; Eye surgery; Video bronchoscopy (Bilateral, 09/20/2015); Back surgery; Video bronchoscopy (N/A, 01/18/2016); Video assisted thoracoscopy (vats)/wedge resection (Right, 01/18/2016); ir generic historical (01/16/2017); ir generic historical (01/31/2017); ir generic historical (01/31/2017); and ir generic historical (01/31/2017).  Objective:    Vitals: BP (!) 135/57   Pulse (!) 103   Temp 99.7 F (37.6 C) (Oral)   Resp 18   Ht 5\' 4"  (1.626 m)   Wt 172 lb 3.2 oz (78.1 kg)   SpO2 91%   BMI 29.56 kg/m   SpO2 remained at 91% s/p duo-neb treatment.  Physical Exam  Constitutional: She is oriented to person, place, and time. She appears well-developed and well-nourished.  HENT:  Mouth/Throat: Oropharynx is clear and moist.  Eyes: No scleral icterus.  Neck: Normal range of motion. Neck supple.  Cardiovascular: Normal rate, regular rhythm and intact distal pulses. Exam reveals no gallop and no friction rub.  No murmur heard. Pulmonary/Chest: No respiratory distress. She has wheezes. She has no rales.  Coarse lung sounds throughout with wheezing.  Lymphadenopathy:    She has no cervical adenopathy.  Neurological: She is alert and oriented to person, place, and time.  Skin: Skin is warm and dry.   Dg Chest 2 View  Result Date: 01/23/2018 CLINICAL DATA:  Cough EXAM: CHEST - 2 VIEW COMPARISON:  Chest CT, 11/11/2017.  Chest radiographs, 12/08/2016. FINDINGS: Cardiac silhouette is normal in size. No mediastinal or hilar masses. No evidence of adenopathy. Lungs are hyperexpanded. There are prominent bronchovascular markings and several reticular type opacities at the lung bases, the latter finding consistent with scarring or subsegmental atelectasis. No evidence of pneumonia or pulmonary edema. No pleural effusion or pneumothorax. Two upper thoracic vertebral fractures have been treated with kyphoplasty, new since the prior study. IMPRESSION: 1. No acute cardiopulmonary disease.  No evidence of pneumonia. 2. COPD. Electronically Signed   By: Lajean Manes M.D.   On: 01/23/2018 10:33    Results for orders placed or performed in visit on 01/23/18 (from the past 24 hour(s))  POCT Influenza A/B     Status: Abnormal   Collection Time: 01/23/18 10:23 AM  Result Value Ref Range   Influenza A, POC Positive (A) Negative   Influenza B, POC  Negative Negative    Assessment and Plan :   Acute respiratory failure with hypoxia (HCC)  Hypoxia - Plan: ipratropium (ATROVENT) nebulizer solution 0.5 mg, albuterol (PROVENTIL) (2.5 MG/3ML) 0.083% nebulizer solution 2.5 mg, levalbuterol (XOPENEX) nebulizer solution 1.25 mg  Chronic obstructive pulmonary disease, unspecified COPD type ([place]) - Plan: levalbuterol (XOPENEX) nebulizer solution 1.25 mg  Cough - Plan: DG Chest 2 View, POCT Influenza A/B, levalbuterol (XOPENEX) nebulizer solution 1.25 mg  Shortness of breath - Plan: ipratropium (ATROVENT) nebulizer solution 0.5 mg, albuterol (PROVENTIL) (2.5 MG/3ML) 0.083% nebulizer solution 2.5 mg, levalbuterol (XOPENEX) nebulizer solution 1.25 mg  Wheezing - Plan: ipratropium (ATROVENT) nebulizer solution 0.5 mg, albuterol (PROVENTIL) (2.5 MG/3ML) 0.083% nebulizer solution 2.5 mg, levalbuterol (XOPENEX) nebulizer solution 1.25 mg  Influenza A  Case pre-cepted with Dr. Brigitte Pulse. Patient will be sent to ER via ambulance due to respiratory failure in setting of hypoxia, COPD, new onset influenza A. She was placed on 2 liters of oxygen in clinic. Patient verbalized understanding treatment plan.  [name], PA-C [facility] [phone] 01/23/2018  10:17 AM

## 2018-01-23 NOTE — H&P (Signed)
Patient Demographics:    Tammy Ball, is a 77 y.o. female  MRN: [id number]   DOB - [birth year]  Admit Date - 01/23/2018  Outpatient Primary MD for the patient is Wardell Honour, MD   Assessment & Plan:    Principal Problem:   AKI (acute kidney injury) [facility]) Active Problems:   Essential hypertension   COPD GOLD II    Influenza A  1)AKI-suspect acute kidney injury secondary to dehydration in the setting of fevers, poor appetite and decreased oral intake secondary to influenza A, no vomiting or diarrhea, hydrate IV n.p.o., avoid nephrotoxic agents, baseline creatinine 2 months ago was 1.1, creatinine today 1.4  2)Influenza A-Tamiflu as ordered, bronchodilators, mucolytics and supportive and symptomatic treatment as ordered, she had a flu shot on August 23, 2017, chest x-ray without pneumonia clinically no evidence of pneumonia,  3) acute COPD exacerbation with concerns about hypoxia-secondary to #2 above, give prednisone, mucolytics, bronchodilators, and supplemental oxygen, at baseline patient has COPD Gold stage II, she is a reformed smoker.  Patient also has history of underlying lung cancer with previous radiation therapy and lobectomy, chest x-ray without pneumonia clinically no evidence of pneumonia, hold off on antibiotics this time.  Prior to admission patient was not on oxygen  4)HTN-stable, continue Toprol-XL 50 mg daily,  may use IV Hydralazine 10 mg  Every 4 hours Prn for systolic blood pressure over 160 mmhg  5) acute hypoxic respiratory failure secondary to #2 #3 above, treat as outlined in  #2 and  #3 above  With History of - Reviewed by me  Past Medical History:  Diagnosis Date  . Anemia    was while doing chemo  . Anxiety   . Asthma   . Chemotherapy-induced neuropathy ([place]) 11/01/2015  .  Claustrophobia   . COPD (chronic obstructive pulmonary disease) ([place])   . Depression   . Encounter for antineoplastic chemotherapy 02/09/2016  . Glaucoma   . Headache    prior to menopause  . Heart murmur   . History of echocardiogram    Echo 2/17: EF 36-62%, grade 1 diastolic dysfunction, mild MR, trivial pericardial effusion  . History of nuclear stress test    Myoview 2/17: no ischemia or scar, EF 79%; low risk  . History of radiation therapy 10/30/16-11/06/16  . Hyperlipidemia   . Hypertension   . Insomnia 05/16/2016  . Non-small cell carcinoma of right lung, stage 2 ([place]) 10/03/2015  . Radiation 02/29/16-04/10/16   50.4 Gy to right central chest  . Renal cell carcinoma ([place])    L nephrectomy  in 2012      Past Surgical History:  Procedure Laterality Date  . BACK SURGERY     cervical 1991  . EYE SURGERY    . IR GENERIC HISTORICAL  01/16/2017   IR RADIOLOGIST EVAL & MGMT 01/16/2017 [facility]  . IR GENERIC HISTORICAL  01/31/2017   IR FLUORO GUIDED NEEDLE PLC ASPIRATION/INJECTION LOC 01/31/2017  Luanne Bras, MD [facility]  . IR GENERIC HISTORICAL  01/31/2017   IR VERTEBROPLASTY CERV/THOR BX INC UNI/BIL INC/INJECT/IMAGING 01/31/2017 Luanne Bras, MD [facility]  . IR GENERIC HISTORICAL  01/31/2017   IR VERTEBROPLASTY EA ADDL (T&LS) BX INC UNI/BIL INC INJECT/IMAGING 01/31/2017 Luanne Bras, MD [facility]  . kidney cancer    . NEPHRECTOMY    . SPINE SURGERY    . TUBAL LIGATION    . VIDEO ASSISTED THORACOSCOPY (VATS)/WEDGE RESECTION Right 01/18/2016   Procedure: VIDEO ASSISTED THORACOSCOPY (VATS)/LUNG RESECTION, THOROCOTOMY, RIGHT UPPER LOBECTOMY, LYMPH NODE DISSECTION, PLACEMENT OF ON Q;  Surgeon: [name], MD;  Location: [facility];  Service: Thoracic;  Laterality: Right;  Marland Kitchen VIDEO BRONCHOSCOPY Bilateral 09/20/2015   Procedure: VIDEO BRONCHOSCOPY WITHOUT FLUORO;  Surgeon: Rigoberto Noel, MD;  Location: [facility];  Service: Cardiopulmonary;  Laterality:  Bilateral;  . VIDEO BRONCHOSCOPY N/A 01/18/2016   Procedure: VIDEO BRONCHOSCOPY;  Surgeon: [name], MD;  Location: [facility];  Service: Thoracic;  Laterality: N/A;    Chief Complaint  Patient presents with  . Shortness of Breath      HPI:    Tammy Ball  is a 77 y.o. female who is a reformed smoker with previous history of lung cancer status post previous lobectomy and previous radiation therapy as well as history of renal carcinoma, COPD Gold stage II and hypertension who presents to the ED from PCPs office/urgent care with concerns about worsening respiratory symptoms and hypoxia.    Patient has had URI symptoms for over 4 days, she has been using a bronchodilators without much relief she has had nausea but no vomiting no diarrhea, oral intake has been decreased  In Urgent care/ED--- workup was consistent with acute kidney injury with worsening creatinine, as well as positive influenza A, chest x-ray was without pneumonia or other acute findings  Despite bronchodilators patient's O2 sats remained borderline low, please note that the patient was not previously on oxygen, patient was also tachypneic and tachycardic.  Patient had chest discomfort only with cough otherwise no frank chest pains, she did have fevers and chills    Review of systems:    In addition to the HPI above,   A full 12 point Review of 10 Systems was done, except as stated above, all other Review of 10 Systems were negative.    Social History:  Reviewed by me    Social History   Tobacco Use  . Smoking status: Former Smoker    Packs/day: 0.50    Years: 50.00    Pack years: 25.00    Types: Cigarettes    Last attempt to quit: 03/16/2011    Years since quitting: 6.8  . Smokeless tobacco: Never Used  Substance Use Topics  . Alcohol use: No    Alcohol/week: 0.0 oz     Family History :  Reviewed by me   Family History  Problem Relation Age of Onset  . Heart disease Sister   . Obesity Brother   .  Heart attack Daughter 34       s/p CABG  . Glaucoma Daughter   . Breast cancer Sister   . Birth defects Sister   . Cancer Mother        Bladder Cancer  . Hypertension Mother   . CAD Mother 80  . Cancer Maternal Grandmother      Home Medications:   Prior to Admission medications   Medication Sig Start Date End Date Taking? Authorizing Provider  acetaminophen (TYLENOL) 500 MG tablet Take 500 mg by mouth every 6 (six) hours as needed for mild pain, moderate pain, fever or headache. Reported on 05/24/2016   Yes [provider]  albuterol (PROVENTIL HFA;VENTOLIN HFA) 108 (90 BASE) MCG/ACT inhaler Inhale 2 puffs into the lungs every 4 (four) hours as needed for wheezing or shortness of breath. 05/18/15 01/23/18 Yes [name], MD  aspirin EC 81 MG tablet Take 81 mg by mouth daily. Reported on 04/11/2016   Yes [provider]  atorvastatin (LIPITOR) 40 MG tablet TAKE ONE TABLET BY MOUTH ONCE DAILY. 11/13/17  Yes Wardell Honour, MD  cholecalciferol (VITAMIN D) 1000 units tablet Take 1 tablet by mouth daily.   Yes [provider]  Cholecalciferol (VITAMIN D3 PO) Take 1,200 mg by mouth daily.   Yes [provider]  fish oil-omega-3 fatty acids 1000 MG capsule Take 1 capsule by mouth daily.    Yes [provider]  Fluticasone-Salmeterol (ADVAIR) 250-50 MCG/DOSE AEPB Inhale 1 puff into the lungs 2 (two) times daily. 11/13/17  Yes Wardell Honour, MD  metoprolol succinate (TOPROL-XL) 50 MG 24 hr tablet Take 1 tablet (50 mg total) by mouth daily. Take with or immediately following a meal. 11/13/17  Yes Wardell Honour, MD  traZODone (DESYREL) 50 MG tablet Take 0.5-1 tablets (25-50 mg total) by mouth at bedtime as needed for sleep. 11/13/17  Yes Wardell Honour, MD     Allergies:     Allergies  Allergen Reactions  . Bee Venom Anaphylaxis, Shortness Of Breath, Swelling and Other (See Comments)    Swelling at site   . Amlodipine Swelling and Other (See  Comments)    Swelling of the ankles and hands   . Levofloxacin Other (See Comments)    Joint pain   . Fosamax [Alendronate Sodium]     Joint pain  . Hctz [Hydrochlorothiazide] Palpitations and Other (See Comments)    Sweating      Physical Exam:   Vitals  Blood pressure 133/74, pulse 89, temperature 100.1 F (37.8 C), temperature source Oral, resp. rate (!) 26, SpO2 95 %.  Physical Examination: General appearance - alert, ill appearing, mild conversational dyspnea noted Mental status - alert, oriented to person, place, and time,  Nose- [place] 2 L/min Eyes - sclera anicteric Neck - supple, no JVD elevation , Chest -diminished breath sounds bilaterally with scattered wheezes and rhonchi bilaterally  heart - S1 and S2 normal, HR 104 Abdomen - soft, nontender, nondistended, no masses or organomegaly Neurological - screening mental status exam normal, neck supple without rigidity, cranial nerves II through XII intact, DTR's normal and symmetric Extremities - no pedal edema noted, intact peripheral pulses  Skin - warm, dry    Data Review:    CBC Recent Labs  Lab 01/23/18 1415  WBC 13.4*  HGB 13.1  HCT 39.3  PLT 266  MCV 95.4  MCH 31.8  MCHC 33.3  RDW 15.4  LYMPHSABS 1.2  MONOABS 0.9  EOSABS 0.0  BASOSABS 0.0   ------------------------------------------------------------------------------------------------------------------  Chemistries  Recent Labs  Lab 01/23/18 1415  NA 138  K 4.2  CL 98*  CO2 29  GLUCOSE 113*  BUN 17  CREATININE 1.41*  CALCIUM 9.2  AST 19  ALT 15  ALKPHOS 90  BILITOT 0.7   ------------------------------------------------------------------------------------------------------------------ estimated creatinine clearance is 35.4 mL/min (A) (by C-G formula based on SCr of 1.41 mg/dL (H)). ------------------------------------------------------------------------------------------------------------------ No results for input(s): TSH, T4TOTAL,  T3FREE, THYROIDAB in the  last 72 hours.  Invalid input(s): FREET3   Coagulation profile No results for input(s): INR, PROTIME in the last 168 hours. ------------------------------------------------------------------------------------------------------------------- No results for input(s): DDIMER in the last 72 hours. -------------------------------------------------------------------------------------------------------------------  Cardiac Enzymes No results for input(s): CKMB, TROPONINI, MYOGLOBIN in the last 168 hours.  Invalid input(s): CK ------------------------------------------------------------------------------------------------------------------    Component Value Date/Time   BNP 156.9 (H) 01/23/2018 1415   BNP 16.7 12/27/2011 0854  ---------------------------------------------------------------------------------------------------------------  Urinalysis    Component Value Date/Time   COLORURINE YELLOW 01/16/2016 0933   APPEARANCEUR CLEAR 01/16/2016 0933   LABSPEC 1.007 01/16/2016 0933   LABSPEC 1.010 10/18/2015 0959   PHURINE 5.5 01/16/2016 0933   GLUCOSEU NEGATIVE 01/16/2016 0933   GLUCOSEU Negative 10/18/2015 0959   HGBUR NEGATIVE 01/16/2016 0933   BILIRUBINUR negative 11/13/2017 1725   BILIRUBINUR Negative 10/18/2015 0959   KETONESUR negative 11/13/2017 1725   KETONESUR NEGATIVE 01/16/2016 0933   PROTEINUR negative 11/13/2017 1725   PROTEINUR NEGATIVE 01/16/2016 0933   UROBILINOGEN 0.2 11/13/2017 1725   UROBILINOGEN 0.2 10/18/2015 0959   NITRITE Negative 11/13/2017 1725   NITRITE NEGATIVE 01/16/2016 0933   LEUKOCYTESUR Small (1+) (A) 11/13/2017 1725   LEUKOCYTESUR Negative 10/18/2015 0959   ---------------------------------------------------------------------------------------------------------------   Imaging Results:    Dg Chest 2 View  Result Date: 01/23/2018 CLINICAL DATA:  Cough EXAM: CHEST - 2 VIEW COMPARISON:  Chest CT, 11/11/2017.  Chest  radiographs, 12/08/2016. FINDINGS: Cardiac silhouette is normal in size. No mediastinal or hilar masses. No evidence of adenopathy. Lungs are hyperexpanded. There are prominent bronchovascular markings and several reticular type opacities at the lung bases, the latter finding consistent with scarring or subsegmental atelectasis. No evidence of pneumonia or pulmonary edema. No pleural effusion or pneumothorax. Two upper thoracic vertebral fractures have been treated with kyphoplasty, new since the prior study. IMPRESSION: 1. No acute cardiopulmonary disease.  No evidence of pneumonia. 2. COPD. Electronically Signed   By: Lajean Manes M.D.   On: 01/23/2018 10:33    Radiological Exams on Admission: Dg Chest 2 View  Result Date: 01/23/2018 CLINICAL DATA:  Cough EXAM: CHEST - 2 VIEW COMPARISON:  Chest CT, 11/11/2017.  Chest radiographs, 12/08/2016. FINDINGS: Cardiac silhouette is normal in size. No mediastinal or hilar masses. No evidence of adenopathy. Lungs are hyperexpanded. There are prominent bronchovascular markings and several reticular type opacities at the lung bases, the latter finding consistent with scarring or subsegmental atelectasis. No evidence of pneumonia or pulmonary edema. No pleural effusion or pneumothorax. Two upper thoracic vertebral fractures have been treated with kyphoplasty, new since the prior study. IMPRESSION: 1. No acute cardiopulmonary disease.  No evidence of pneumonia. 2. COPD. Electronically Signed   By: Lajean Manes M.D.   On: 01/23/2018 10:33   DVT Prophylaxis -SCD   AM Labs Ordered, also please review Full Orders  Family Communication: Admission, patients condition and plan of care including tests being ordered have been discussed with the patient who indicate understanding and agree with the plan   Code Status - Full Code  Likely DC to  Home   Condition   fair  Roxan Hockey M.D on 01/23/2018 at 6:34 PM  Between 7am to 7pm - Pager - [phone] After 7pm  go to [email or web address] - password [email or web address]  [facility] - Office  [phone]  Voice Recognition Viviann Spare dictation system was used to create this note, attempts have been made to correct errors. Please contact the author with questions and/or clarifications.

## 2018-01-23 NOTE — ED Notes (Signed)
Bed: [facility] Expected date:  Expected time:  Means of arrival:  Comments: EMS-flu-room 12

## 2018-01-23 NOTE — ED Provider Notes (Signed)
[facility] DEPT Provider Note   CSN: [id number] Arrival date & time: 01/23/18  1204     History   Chief Complaint Chief Complaint  Patient presents with  . Shortness of Breath    HPI Tammy Ball is a 77 y.o. female with a history of non-small cell right lung carcinoma and left renal carcinoma both in remission, COPD who presents today for evaluation of shortness of breath.  She went to her primary care's office today for evaluation and was found to be hypoxic on room air at 91%.  She reports that for the past 4 days she has had fevers, body aches, sweating, and generally not feeling well.  She reports that her cough is occasionally productive with occasional streaks of blood in it.  She denies frank hemoptysis.  Reports compliance with her COPD medications.  At her primary care provider's office they did a flu swab where she reportedly tested positive for flu A.  He was transferred here for further evaluation.  He denies any abdominal pain, nausea, vomiting, or diarrhea.  She does report occasional chest pain, worse when she coughs.  She reports feeling short of breath.  HPI  Past Medical History:  Diagnosis Date  . Anemia    was while doing chemo  . Anxiety   . Asthma   . Chemotherapy-induced neuropathy ([place]) 11/01/2015  . Claustrophobia   . COPD (chronic obstructive pulmonary disease) ([place])   . Depression   . Encounter for antineoplastic chemotherapy 02/09/2016  . Glaucoma   . Headache    prior to menopause  . Heart murmur   . History of echocardiogram    Echo 2/17: EF 62-13%, grade 1 diastolic dysfunction, mild MR, trivial pericardial effusion  . History of nuclear stress test    Myoview 2/17: no ischemia or scar, EF 79%; low risk  . History of radiation therapy 10/30/16-11/06/16  . Hyperlipidemia   . Hypertension   . Insomnia 05/16/2016  . Non-small cell carcinoma of right lung, stage 2 ([place]) 10/03/2015  . Radiation 02/29/16-04/10/16   50.4 Gy to right central chest  . Renal cell carcinoma ([place])    L nephrectomy  in 2012    Patient Active Problem List   Diagnosis Date Noted  . History of compression fracture of spine 12/10/2016  . Hypercalcemia 06/25/2016  . Insomnia 05/16/2016  . Pneumonitis, radiation ([place]) 04/11/2016  . S/P lobectomy of lung 01/18/2016  . Chemotherapy-induced neuropathy ([place]) 11/01/2015  . Non-small cell carcinoma of right lung, stage 2 ([place]) 10/03/2015  . Morbid obesity ([place]) 10/01/2015  . Hyperlipidemia 08/14/2013  . Spondylolisthesis of lumbar region 07/03/2013  . Glaucoma 10/23/2012  . Essential hypertension 12/17/2011  . COPD GOLD II  12/17/2011  . Renal cell cancer ([place]) 12/17/2011  . Sciatica of left side 12/17/2011  . Depression 12/17/2011    Past Surgical History:  Procedure Laterality Date  . BACK SURGERY     cervical 1991  . EYE SURGERY    . IR GENERIC HISTORICAL  01/16/2017   IR RADIOLOGIST EVAL & MGMT 01/16/2017 [facility]  . IR GENERIC HISTORICAL  01/31/2017   IR FLUORO GUIDED NEEDLE PLC ASPIRATION/INJECTION LOC 01/31/2017 Luanne Bras, MD [facility]  . IR GENERIC HISTORICAL  01/31/2017   IR VERTEBROPLASTY CERV/THOR BX INC UNI/BIL INC/INJECT/IMAGING 01/31/2017 Luanne Bras, MD [facility]  . IR GENERIC HISTORICAL  01/31/2017   IR VERTEBROPLASTY EA ADDL (T&LS) BX INC UNI/BIL INC INJECT/IMAGING 01/31/2017 Luanne Bras, MD [facility]  .  kidney cancer    . NEPHRECTOMY    . SPINE SURGERY    . TUBAL LIGATION    . VIDEO ASSISTED THORACOSCOPY (VATS)/WEDGE RESECTION Right 01/18/2016   Procedure: VIDEO ASSISTED THORACOSCOPY (VATS)/LUNG RESECTION, THOROCOTOMY, RIGHT UPPER LOBECTOMY, LYMPH NODE DISSECTION, PLACEMENT OF ON Q;  Surgeon: [name], MD;  Location: [facility];  Service: Thoracic;  Laterality: Right;  Marland Kitchen VIDEO BRONCHOSCOPY Bilateral 09/20/2015   Procedure: VIDEO BRONCHOSCOPY WITHOUT FLUORO;  Surgeon: Rigoberto Noel, MD;  Location: [facility];  Service:  Cardiopulmonary;  Laterality: Bilateral;  . VIDEO BRONCHOSCOPY N/A 01/18/2016   Procedure: VIDEO BRONCHOSCOPY;  Surgeon: [name], MD;  Location: [facility] OR;  Service: Thoracic;  Laterality: N/A;    OB History    No data available       Home Medications    Prior to Admission medications   Medication Sig Start Date End Date Taking? Authorizing Provider  acetaminophen (TYLENOL) 500 MG tablet Take 500 mg by mouth every 6 (six) hours as needed for mild pain, moderate pain, fever or headache. Reported on 05/24/2016   Yes [provider]  albuterol (PROVENTIL HFA;VENTOLIN HFA) 108 (90 BASE) MCG/ACT inhaler Inhale 2 puffs into the lungs every 4 (four) hours as needed for wheezing or shortness of breath. 05/18/15 01/23/18 Yes [name], MD  aspirin EC 81 MG tablet Take 81 mg by mouth daily. Reported on 04/11/2016   Yes [provider]  atorvastatin (LIPITOR) 40 MG tablet TAKE ONE TABLET BY MOUTH ONCE DAILY. 11/13/17  Yes Wardell Honour, MD  cholecalciferol (VITAMIN D) 1000 units tablet Take 1 tablet by mouth daily.   Yes [provider]  Cholecalciferol (VITAMIN D3 PO) Take 1,200 mg by mouth daily.   Yes [provider]  fish oil-omega-3 fatty acids 1000 MG capsule Take 1 capsule by mouth daily.    Yes [provider]  Fluticasone-Salmeterol (ADVAIR) 250-50 MCG/DOSE AEPB Inhale 1 puff into the lungs 2 (two) times daily. 11/13/17  Yes Wardell Honour, MD  metoprolol succinate (TOPROL-XL) 50 MG 24 hr tablet Take 1 tablet (50 mg total) by mouth daily. Take with or immediately following a meal. 11/13/17  Yes Wardell Honour, MD  traZODone (DESYREL) 50 MG tablet Take 0.5-1 tablets (25-50 mg total) by mouth at bedtime as needed for sleep. 11/13/17  Yes Wardell Honour, MD    Family History Family History  Problem Relation Age of Onset  . Heart disease Sister   . Obesity Brother   . Heart attack Daughter 48       s/p CABG  . Glaucoma Daughter   . Breast  cancer Sister   . Birth defects Sister   . Cancer Mother        Bladder Cancer  . Hypertension Mother   . CAD Mother 35  . Cancer Maternal Grandmother     Social History Social History   Tobacco Use  . Smoking status: Former Smoker    Packs/day: 0.50    Years: 50.00    Pack years: 25.00    Types: Cigarettes    Last attempt to quit: 03/16/2011    Years since quitting: 6.8  . Smokeless tobacco: Never Used  Substance Use Topics  . Alcohol use: No    Alcohol/week: 0.0 oz  . Drug use: No     Allergies   Bee venom; Amlodipine; Levofloxacin; Fosamax [alendronate sodium]; and Hctz [hydrochlorothiazide]   Review of Systems Review of Systems  Constitutional: Positive for  chills, fatigue and fever.  HENT: Positive for congestion, sinus pressure, sinus pain and sore throat. Negative for ear pain, hearing loss and trouble swallowing.   Eyes: Negative for visual disturbance.  Respiratory: Positive for cough and shortness of breath. Negative for chest tightness and wheezing.   Cardiovascular: Negative for chest pain, palpitations and leg swelling.  Gastrointestinal: Negative for abdominal pain, diarrhea, nausea and vomiting.  Genitourinary: Negative for dysuria.  Musculoskeletal: Negative for back pain and neck pain.  Skin: Positive for rash (She feels like she has shingles for 3 plus weeks. ).  All other systems reviewed and are negative.    Physical Exam Updated Vital Signs SpO2 98% Comment: 2L nasal canula   Physical Exam  Constitutional: She appears well-developed and well-nourished.  Non-toxic appearance. No distress.  HENT:  Head: Normocephalic and atraumatic.  Mouth/Throat: Oropharynx is clear and moist.  Eyes: Conjunctivae are normal.  Neck: Neck supple.  Cardiovascular: Normal rate and regular rhythm.  No murmur heard. Pulmonary/Chest: Effort normal. No respiratory distress. She has decreased breath sounds (Diffusely).  She is able to speak 1-2 sentences before  she stops to take a deep breath.   Abdominal: Soft. Normal appearance and bowel sounds are normal. She exhibits no distension. There is no tenderness. There is no rigidity, no rebound and no guarding.  Musculoskeletal: She exhibits no edema.  Neurological: She is alert.  Skin: Skin is warm and dry.  There are multiple vessicles present on dermotomal distribution of right lateral chest/breast.  Please see clinical pictures  Psychiatric: She has a normal mood and affect. Her behavior is normal.  Nursing note and vitals reviewed.        ED Treatments / Results  Labs (all labs ordered are listed, but only abnormal results are displayed) Labs Reviewed  COMPREHENSIVE METABOLIC PANEL - Abnormal; Notable for the following components:      Result Value   Chloride 98 (*)    Glucose, Bld 113 (*)    Creatinine, Ser 1.41 (*)    GFR calc non Af Amer 36 (*)    GFR calc Af Amer 41 (*)    All other components within normal limits  CBC WITH DIFFERENTIAL/PLATELET - Abnormal; Notable for the following components:   WBC 13.4 (*)    Neutro Abs 11.3 (*)    All other components within normal limits  BRAIN NATRIURETIC PEPTIDE - Abnormal; Notable for the following components:   B Natriuretic Peptide 156.9 (*)    All other components within normal limits  BASIC METABOLIC PANEL - Abnormal; Notable for the following components:   Sodium 133 (*)    Chloride 97 (*)    Glucose, Bld 195 (*)    Creatinine, Ser 1.17 (*)    Calcium 8.6 (*)    GFR calc non Af Amer 45 (*)    GFR calc Af Amer 52 (*)    All other components within normal limits  GLUCOSE, CAPILLARY - Abnormal; Notable for the following components:   Glucose-Capillary 103 (*)    All other components within normal limits  GLUCOSE, CAPILLARY - Abnormal; Notable for the following components:   Glucose-Capillary 138 (*)    All other components within normal limits  I-STAT TROPONIN, ED    EKG  EKG Interpretation  Date/Time:  Thursday January 23 2018 14:38:22 EDT Ventricular Rate:  92 PR Interval:    QRS Duration: 83 QT Interval:  352 QTC Calculation: 436 R Axis:   -114 Text Interpretation:  Sinus rhythm  Left anterior fascicular block Abnormal R-wave progression, late transition similar to prior 9/17 Confirmed by Aletta Edouard [id number]) on 01/23/2018 2:53:31 PM       Radiology Dg Chest 2 View  Result Date: 01/23/2018 CLINICAL DATA:  Cough EXAM: CHEST - 2 VIEW COMPARISON:  Chest CT, 11/11/2017.  Chest radiographs, 12/08/2016. FINDINGS: Cardiac silhouette is normal in size. No mediastinal or hilar masses. No evidence of adenopathy. Lungs are hyperexpanded. There are prominent bronchovascular markings and several reticular type opacities at the lung bases, the latter finding consistent with scarring or subsegmental atelectasis. No evidence of pneumonia or pulmonary edema. No pleural effusion or pneumothorax. Two upper thoracic vertebral fractures have been treated with kyphoplasty, new since the prior study. IMPRESSION: 1. No acute cardiopulmonary disease.  No evidence of pneumonia. 2. COPD. Electronically Signed   By: Lajean Manes M.D.   On: 01/23/2018 10:33    Procedures Procedures (including critical care time)  Medications Ordered in ED Medications - No data to display   Initial Impression / Assessment and Plan / ED Course  I have reviewed the triage vital signs and the nursing notes.  Pertinent labs & imaging results that were available during my care of the patient were reviewed by me and considered in my medical decision making (see chart for details).  Clinical Course as of Jan 23 1737  Thu Jan 24, 5371  2043 77 year old lady with complaining of upper respiratory infection for 3 or 4 days.  She is been more short of breath and coughing up.  She is also had fevers.  She went to her PCP and was found to be positive for influenza.  Because of her hypoxia they transferred here for further evaluation.  She is speaking in  full sentences and is otherwise nontoxic-appearing.  She does have mild elevation of her WBCs.  A continue on his last workup but if she remains hypoxic then she would need to come into the hospital for further evaluation.  [MB]  9407 Patient re-evaluated, has not gotten any treatments yet.  Unable to find Rn.   [EH]    Clinical Course User Index [EH] Lorin Glass, PA-C [MB] Hayden Rasmussen, MD   Patient presents today for evaluation of shortness of breath and upper respiratory infection for the past 3 or 4 days.  She tested positive for influenza A and was found to be hypoxic.  She has a mild white count elevation.  She was treated with Rocephin for presumed acute on chronic COPD exacerbation.  She was also treated with Tamiflu given her flu positive status and that she will require admission due to new oxygen requirement and hypoxia.  She was also found to have a rash that appears consistent with herpes zoster.  This is been present for multiple weeks, therefore treatment with acyclovir was not initiated.  She was given steroids for her shortness of breath, along with a DuoNeb with mild relief.  Hospitalist was consulted for admission who agreed to admit patient.  Final Clinical Impressions(s) / ED Diagnoses   Final diagnoses:  Shortness of breath  Influenza  Herpes zoster without complication    ED Discharge Orders    None       Ollen Gross 01/24/18 1959    Hayden Rasmussen, MD 01/25/18 1946

## 2018-01-24 ENCOUNTER — Encounter (HOSPITAL_COMMUNITY): Payer: Self-pay | Admitting: *Deleted

## 2018-01-24 LAB — BASIC METABOLIC PANEL
Anion gap: 11 (ref 5–15)
BUN: 19 mg/dL (ref 6–20)
CO2: 25 mmol/L (ref 22–32)
Calcium: 8.6 mg/dL — ABNORMAL LOW (ref 8.9–10.3)
Chloride: 97 mmol/L — ABNORMAL LOW (ref 101–111)
Creatinine, Ser: 1.17 mg/dL — ABNORMAL HIGH (ref 0.44–1.00)
GFR calc Af Amer: 52 mL/min — ABNORMAL LOW (ref >60)
GFR calc non Af Amer: 45 mL/min — ABNORMAL LOW (ref >60)
Glucose, Bld: 195 mg/dL — ABNORMAL HIGH (ref 65–99)
Potassium: 4.6 mmol/L (ref 3.5–5.1)
Sodium: 133 mmol/L — ABNORMAL LOW (ref 135–145)

## 2018-01-24 LAB — GLUCOSE, CAPILLARY
Glucose-Capillary: 103 mg/dL — ABNORMAL HIGH (ref 65–99)
Glucose-Capillary: 138 mg/dL — ABNORMAL HIGH (ref 65–99)
Glucose-Capillary: 129 mg/dL — ABNORMAL HIGH (ref 65–99)

## 2018-01-24 MED ORDER — BENZONATATE 100 MG PO CAPS
200.0000 mg | ORAL_CAPSULE | Freq: Three times a day (TID) | ORAL | Status: DC | PRN
  Administered 2018-01-24 – 2018-01-25 (×4): 200 mg via ORAL
  Filled 2018-01-24 (×4): qty 2

## 2018-01-24 MED ORDER — INSULIN ASPART 100 UNIT/ML ~~LOC~~ SOLN
0.0000 [IU] | Freq: Three times a day (TID) | SUBCUTANEOUS | Status: DC
  Administered 2018-01-24: 1 [IU] via SUBCUTANEOUS

## 2018-01-24 MED ORDER — GABAPENTIN 100 MG PO CAPS
100.0000 mg | ORAL_CAPSULE | Freq: Three times a day (TID) | ORAL | Status: DC
  Administered 2018-01-24 – 2018-01-25 (×4): 100 mg via ORAL
  Filled 2018-01-24 (×4): qty 1

## 2018-01-24 NOTE — Progress Notes (Signed)
Patient Demographics:    Tammy Ball, is a 77 y.o. female, DOB - 1941-02-10, XKG:[id number]  Admit date - 01/23/2018   Admitting Physician Hardin Hardenbrook Denton Brick, MD  Outpatient Primary MD for the patient is Wardell Honour, MD  LOS - 1   Chief Complaint  Patient presents with  . Shortness of Breath        Subjective:    Tammy Ball today has no fevers, no emesis,  No chest pain,  Son at bedside, cough and sob is better, voiding well, still requiring oxygen   Assessment  & Plan :    Principal Problem:   AKI (acute kidney injury) ([place]) Active Problems:   Essential hypertension   COPD GOLD II    Influenza A   1)AKI- suspect acute kidney injury secondary to dehydration in the setting of fevers, poor appetite and decreased oral intake secondary to influenza A,  AKI has resolved, creatinine is back to 1.1 from 1.4 on admission, no vomiting or diarrhea,  avoid nephrotoxic agents, patient only has one kidney  2)Influenza A- hypoxia has worsened , continue Tamiflu as ordered, bronchodilators, mucolytics and supportive and symptomatic treatment as ordered, she had a flu shot on August 23, 2017, chest x-ray without pneumonia clinically no evidence of pneumonia,  3) acute COPD exacerbation with concerns about hypoxia-secondary to #2 above, continue prednisone, mucolytics, bronchodilators, and supplemental oxygen, at baseline patient has COPD Gold stage II, she is a reformed smoker.  Patient also has history of underlying lung cancer with previous radiation therapy and lobectomy, chest x-ray without pneumonia clinically no evidence of pneumonia, hold off on antibiotics this time.  Prior to admission patient was not on oxygen  4)HTN-stable, continue Toprol-XL 50 mg daily,  may use IV Hydralazine 10 mg  Every 4 hours Prn for systolic blood pressure over 160 mmhg  5) acute hypoxic respiratory failure secondary to  #2 #3 above, treat as outlined in  #2 and  #3 above--- continues to require oxygen, actually oxygen requirement went up to 3 L/min  Code Status : Full   Disposition Plan  : home    DVT Prophylaxis  : - Heparin    Lab Results  Component Value Date   PLT 266 01/23/2018    Inpatient Medications  Scheduled Meds: . cholecalciferol  1,000 Units Oral Daily  . famotidine  20 mg Oral BID  . fluticasone furoate-vilanterol  1 puff Inhalation Daily  . gabapentin  100 mg Oral TID  . guaiFENesin  600 mg Oral BID  . heparin  5,000 Units Subcutaneous Q8H  . insulin aspart  0-9 Units Subcutaneous TID WC  . ipratropium-albuterol  3 mL Nebulization QID  . metoprolol succinate  50 mg Oral Daily  . multivitamin with minerals  1 tablet Oral Daily  . omega-3 acid ethyl esters  1 capsule Oral Daily  . oseltamivir  30 mg Oral BID  . predniSONE  40 mg Oral Q breakfast  . senna  1 tablet Oral BID  . sodium chloride flush  3 mL Intravenous Q12H   Continuous Infusions: . sodium chloride    . sodium chloride 125 mL/hr at 01/24/18 1439   PRN Meds:.sodium chloride, acetaminophen **OR** acetaminophen, albuterol, benzonatate, guaiFENesin-dextromethorphan, hydrALAZINE, ondansetron **OR** ondansetron (ZOFRAN)  IV, polyethylene glycol, sodium chloride flush, traZODone    Anti-infectives (From admission, onward)   Start     Dose/Rate Route Frequency Ordered Stop   01/23/18 2200  oseltamivir (TAMIFLU) capsule 30 mg     30 mg Oral 2 times daily 01/23/18 1818 01/28/18 0959   01/23/18 1530  cefTRIAXone (ROCEPHIN) 1 g in sodium chloride 0.9 % 100 mL IVPB     1 g 200 mL/hr over 30 Minutes Intravenous  Once 01/23/18 1525 01/23/18 1712   01/23/18 1445  oseltamivir (TAMIFLU) capsule 75 mg     75 mg Oral  Once 01/23/18 1439 01/23/18 1552        Objective:   Vitals:   01/24/18 0751 01/24/18 1203 01/24/18 1606 01/24/18 1654  BP:   104/70   Pulse:   76   Resp:   16   Temp:   98 F (36.7 C)   TempSrc:    Oral   SpO2: 93% 99% 98% 96%  Weight:      Height:        Wt Readings from Last 3 Encounters:  01/23/18 79 kg (174 lb 1.6 oz)  01/23/18 78.1 kg (172 lb 3.2 oz)  11/14/17 79.6 kg (175 lb 8 oz)     Intake/Output Summary (Last 24 hours) at 01/24/2018 1823 Last data filed at 01/24/2018 1400 Gross per 24 hour  Intake 2016.25 ml  Output -  Net 2016.25 ml     Physical Exam  Physical Examination: General appearance - alert, in no acute distress Mental status - alert, oriented to person, place, and time,  Nose- [place] 3 L/min Eyes - sclera anicteric Neck - supple, no JVD elevation , Chest -diminished breath sounds bilaterally, more rhonchi and wheezing heart - S1 and S2 normal, HR 104 Abdomen - soft, nontender, nondistended, no masses or organomegaly Neurological - screening mental status exam normal, neck supple without rigidity, cranial nerves II through XII intact, DTR's normal and symmetric Extremities - no pedal edema noted, intact peripheral pulses  Skin - warm, dry, right upper trunk/flank with crusting vesicular lesions in dermatomal a pattern consistent with recent shingles infection    Data Review:   Micro Results No results found for this or any previous visit (from the past 240 hour(s)).  Radiology Reports Dg Chest 2 View  Result Date: 01/23/2018 CLINICAL DATA:  Cough EXAM: CHEST - 2 VIEW COMPARISON:  Chest CT, 11/11/2017.  Chest radiographs, 12/08/2016. FINDINGS: Cardiac silhouette is normal in size. No mediastinal or hilar masses. No evidence of adenopathy. Lungs are hyperexpanded. There are prominent bronchovascular markings and several reticular type opacities at the lung bases, the latter finding consistent with scarring or subsegmental atelectasis. No evidence of pneumonia or pulmonary edema. No pleural effusion or pneumothorax. Two upper thoracic vertebral fractures have been treated with kyphoplasty, new since the prior study. IMPRESSION: 1. No acute cardiopulmonary  disease.  No evidence of pneumonia. 2. COPD. Electronically Signed   By: Lajean Manes M.D.   On: 01/23/2018 10:33     CBC Recent Labs  Lab 01/23/18 1415  WBC 13.4*  HGB 13.1  HCT 39.3  PLT 266  MCV 95.4  MCH 31.8  MCHC 33.3  RDW 15.4  LYMPHSABS 1.2  MONOABS 0.9  EOSABS 0.0  BASOSABS 0.0    Chemistries  Recent Labs  Lab 01/23/18 1415 01/24/18 0501  NA 138 133*  K 4.2 4.6  CL 98* 97*  CO2 29 25  GLUCOSE 113* 195*  BUN 17 19  CREATININE 1.41* 1.17*  CALCIUM 9.2 8.6*  AST 19  --   ALT 15  --   ALKPHOS 90  --   BILITOT 0.7  --    ------------------------------------------------------------------------------------------------------------------ No results for input(s): CHOL, HDL, LDLCALC, TRIG, CHOLHDL, LDLDIRECT in the last 72 hours.  No results found for: HGBA1C ------------------------------------------------------------------------------------------------------------------ No results for input(s): TSH, T4TOTAL, T3FREE, THYROIDAB in the last 72 hours.  Invalid input(s): FREET3 ------------------------------------------------------------------------------------------------------------------ No results for input(s): VITAMINB12, FOLATE, FERRITIN, TIBC, IRON, RETICCTPCT in the last 72 hours.  Coagulation profile No results for input(s): INR, PROTIME in the last 168 hours.  No results for input(s): DDIMER in the last 72 hours.  Cardiac Enzymes No results for input(s): CKMB, TROPONINI, MYOGLOBIN in the last 168 hours.  Invalid input(s): CK ------------------------------------------------------------------------------------------------------------------    Component Value Date/Time   BNP 156.9 (H) 01/23/2018 1415   BNP 16.7 12/27/2011 0854     Roxan Hockey M.D on 01/24/2018 at 6:23 PM  Between 7am to 7pm - Pager - [phone]  After 7pm go to [email or web address] - password [email or web address]  [facility] -  Office  [phone]   Voice Recognition Viviann Spare  dictation system was used to create this note, attempts have been made to correct errors. Please contact the author with questions and/or clarifications.

## 2018-01-25 LAB — GLUCOSE, CAPILLARY
Glucose-Capillary: 97 mg/dL (ref 65–99)
Glucose-Capillary: 96 mg/dL (ref 65–99)

## 2018-01-25 MED ORDER — ALBUTEROL SULFATE HFA 108 (90 BASE) MCG/ACT IN AERS: 2.0000 | INHALATION_SPRAY | RESPIRATORY_TRACT | 0 refills | Status: DC | PRN

## 2018-01-25 MED ORDER — GUAIFENESIN ER 600 MG PO TB12: 600.0000 mg | ORAL_TABLET | Freq: Two times a day (BID) | ORAL | 1 refills | Status: DC

## 2018-01-25 MED ORDER — BENZONATATE 100 MG PO CAPS: 200.0000 mg | ORAL_CAPSULE | Freq: Three times a day (TID) | ORAL | Status: DC | PRN

## 2018-01-25 MED ORDER — OSELTAMIVIR PHOSPHATE 30 MG PO CAPS: 30.0000 mg | ORAL_CAPSULE | Freq: Two times a day (BID) | ORAL | 0 refills | Status: DC

## 2018-01-25 MED ORDER — ALBUTEROL SULFATE (2.5 MG/3ML) 0.083% IN NEBU: 2.5000 mg | INHALATION_SOLUTION | RESPIRATORY_TRACT | 6 refills | Status: DC | PRN

## 2018-01-25 MED ORDER — ADULT MULTIVITAMIN W/MINERALS CH: 1.0000 | ORAL_TABLET | Freq: Every day | ORAL | 1 refills | Status: DC

## 2018-01-25 MED ORDER — FLUTICASONE-SALMETEROL 250-50 MCG/DOSE IN AEPB: 1.0000 | INHALATION_SPRAY | Freq: Two times a day (BID) | RESPIRATORY_TRACT | 4 refills | Status: DC

## 2018-01-25 MED ORDER — GUAIFENESIN-DM 100-10 MG/5ML PO SYRP: 5.0000 mL | ORAL_SOLUTION | ORAL | Status: DC | PRN

## 2018-01-25 MED ORDER — IPRATROPIUM-ALBUTEROL 0.5-2.5 (3) MG/3ML IN SOLN
3.0000 mL | Freq: Three times a day (TID) | RESPIRATORY_TRACT | Status: DC
  Filled 2018-01-25: qty 3

## 2018-01-25 MED ORDER — ONDANSETRON HCL 4 MG PO TABS: 4.0000 mg | ORAL_TABLET | Freq: Four times a day (QID) | ORAL | 0 refills | Status: DC | PRN

## 2018-01-25 MED ORDER — IPRATROPIUM-ALBUTEROL 0.5-2.5 (3) MG/3ML IN SOLN: 3.0000 mL | Freq: Three times a day (TID) | RESPIRATORY_TRACT | 0 refills | Status: DC

## 2018-01-25 MED ORDER — PREDNISONE 20 MG PO TABS: 40.0000 mg | ORAL_TABLET | Freq: Every day | ORAL | 0 refills | Status: DC

## 2018-01-25 MED ORDER — GABAPENTIN 300 MG PO CAPS: 300.0000 mg | ORAL_CAPSULE | Freq: Three times a day (TID) | ORAL | 2 refills | Status: DC

## 2018-01-25 MED ORDER — POLYETHYLENE GLYCOL 3350 17 G PO PACK: 17.0000 g | PACK | Freq: Every day | ORAL | 0 refills | Status: DC | PRN

## 2018-01-25 MED ORDER — FAMOTIDINE 20 MG PO TABS: 20.0000 mg | ORAL_TABLET | Freq: Two times a day (BID) | ORAL | 1 refills | Status: DC

## 2018-01-25 MED ORDER — SENNA 8.6 MG PO TABS: 1.0000 | ORAL_TABLET | Freq: Two times a day (BID) | ORAL | 0 refills | Status: DC

## 2018-01-25 NOTE — Progress Notes (Signed)
SATURATION QUALIFICATIONS: (This note is used to comply with regulatory documentation for home oxygen)  Patient Saturations on Room Air at Rest = 90  Patient Saturations on Room Air while Ambulating = 79  Patient Saturations on 2 Liters of oxygen while Ambulating = 93%  Please briefly explain why patient needs home oxygen:Pt with decreased oxygen saturations and increased shortness of breath with ambulation

## 2018-01-25 NOTE — Discharge Instructions (Signed)
1)Take Medications as advised 2)Follow up with your Primary Care Physician in 1 week for Recheck 3)Use Oxygen as prescribed continuously at 2 L/min via [place]

## 2018-01-25 NOTE — Discharge Summary (Signed)
Tammy Ball, is a 77 y.o. female  DOB [birth year]  MRN [id number].  Admission date:  01/23/2018  Admitting Physician  Barbera Perritt Denton Brick, MD  Discharge Date:  01/25/2018   Primary MD  Wardell Honour, MD  Recommendations for primary care physician for things to follow:   1)Take Medications as advised 2)Follow up with your Primary Care Physician in 1 week for Recheck 3)Use Oxygen as prescribed continuously at 2 L/min via [place]   Admission Diagnosis  Shortness of breath [R06.02] Influenza [J11.1] Herpes zoster without complication [P10.2]   Discharge Diagnosis  Shortness of breath [R06.02] Influenza [J11.1] Herpes zoster without complication [H85.2]    Principal Problem:   AKI (acute kidney injury) ([place]) Active Problems:   Essential hypertension   COPD GOLD II    Influenza A      Past Medical History:  Diagnosis Date  . Anemia    was while doing chemo  . Anxiety   . Asthma   . Chemotherapy-induced neuropathy ([place]) 11/01/2015  . Claustrophobia   . COPD (chronic obstructive pulmonary disease) ([place])   . Depression   . Encounter for antineoplastic chemotherapy 02/09/2016  . Glaucoma   . Headache    prior to menopause  . Heart murmur   . History of echocardiogram    Echo 2/17: EF 77-82%, grade 1 diastolic dysfunction, mild MR, trivial pericardial effusion  . History of nuclear stress test    Myoview 2/17: no ischemia or scar, EF 79%; low risk  . History of radiation therapy 10/30/16-11/06/16  . Hyperlipidemia   . Hypertension   . Insomnia 05/16/2016  . Non-small cell carcinoma of right lung, stage 2 ([place]) 10/03/2015  . Radiation 02/29/16-04/10/16   50.4 Gy to right central chest  . Renal cell carcinoma ([place])    L nephrectomy  in 2012    Past Surgical History:  Procedure Laterality Date  . BACK SURGERY     cervical 1991  . EYE SURGERY    . IR GENERIC HISTORICAL  01/16/2017   IR  RADIOLOGIST EVAL & MGMT 01/16/2017 [facility]  . IR GENERIC HISTORICAL  01/31/2017   IR FLUORO GUIDED NEEDLE PLC ASPIRATION/INJECTION LOC 01/31/2017 Luanne Bras, MD [facility]  . IR GENERIC HISTORICAL  01/31/2017   IR VERTEBROPLASTY CERV/THOR BX INC UNI/BIL INC/INJECT/IMAGING 01/31/2017 Luanne Bras, MD [facility]  . IR GENERIC HISTORICAL  01/31/2017   IR VERTEBROPLASTY EA ADDL (T&LS) BX INC UNI/BIL INC INJECT/IMAGING 01/31/2017 Luanne Bras, MD [facility]  . kidney cancer    . NEPHRECTOMY    . SPINE SURGERY    . TUBAL LIGATION    . VIDEO ASSISTED THORACOSCOPY (VATS)/WEDGE RESECTION Right 01/18/2016   Procedure: VIDEO ASSISTED THORACOSCOPY (VATS)/LUNG RESECTION, THOROCOTOMY, RIGHT UPPER LOBECTOMY, LYMPH NODE DISSECTION, PLACEMENT OF ON Q;  Surgeon: [name], MD;  Location: [facility];  Service: Thoracic;  Laterality: Right;  Marland Kitchen VIDEO BRONCHOSCOPY Bilateral 09/20/2015   Procedure: VIDEO BRONCHOSCOPY WITHOUT FLUORO;  Surgeon: Rigoberto Noel, MD;  Location: [facility];  Service:  Cardiopulmonary;  Laterality: Bilateral;  . VIDEO BRONCHOSCOPY N/A 01/18/2016   Procedure: VIDEO BRONCHOSCOPY;  Surgeon: [name], MD;  Location: [facility] OR;  Service: Thoracic;  Laterality: N/A;      HPI  from the history and physical done on the day of admission:     Tammy Ball  is a 77 y.o. female who is a reformed smoker with previous history of lung cancer status post previous lobectomy and previous radiation therapy as well as history of renal carcinoma, COPD Gold stage II and hypertension who presents to the ED from PCPs office/urgent care with concerns about worsening respiratory symptoms and hypoxia.    Patient has had URI symptoms for over 4 days, she has been using a bronchodilators without much relief she has had nausea but no vomiting no diarrhea, oral intake has been decreased  In Urgent care/ED--- workup was consistent with acute kidney injury with worsening creatinine, as  well as positive influenza A, chest x-ray was without pneumonia or other acute findings  Despite bronchodilators patient's O2 sats remained borderline low, please note that the patient was not previously on oxygen, patient was also tachypneic and tachycardic.  Patient had chest discomfort only with cough otherwise no frank chest pains, she did have fevers and chills      Hospital Course:    1)AKI- suspect acute kidney injury secondary to dehydration in the setting of fevers, poor appetite and decreased oral intake secondary to influenza A,  AKI has resolved, creatinine is back to 1.1 from 1.4 on admission, no vomiting or diarrhea,  avoid nephrotoxic agents, patient only has one kidney  2)Influenza A- hypoxia persisted, okay to complete Tamiflu as ordered, bronchodilators, mucolytics and supportive and symptomatic treatment as ordered,she had a flu shoton August 23, 2017,chest x-ray without pneumonia clinically no evidence of pneumonia.  May discharge home with home oxygen due to underlying COPD  3)acute COPD exacerbation with concerns about hypoxia-secondary to #2 above,  okay to discharge home on home oxygen due to underlying COPD, okay to complete prednisone, mucolytics, bronchodilators.  at baseline patient has COPD Gold stage II, she is a reformed smoker.Patient also has history of underlying lung cancer with previous radiation therapy and lobectomy,chest x-ray without pneumonia clinically no evidence of pneumonia, hold off on antibiotics this time.Prior to admission patient was not on oxygen  4)HTN-stable, continue Toprol-XL 50 mg daily,  5)Acute Hypoxic Respiratory Failure secondary to #2 #3 above, treat as outlined in #2 and#3 above--- continues to require oxygen, okay to discharge home on home oxygen due to underlying COPD,  6)Rt Flank Shingles-crusted lesions, appears to be resolving,  patient is largely asymptomatic  Code Status : Full   Disposition Plan  :  home    Discharge Condition: Stable  Follow UP-PCP  [place] Follow up.   Why:  Home Oxygen Concentrator will be delivered to home Contact information: [address] [place] [address] [phone]           Diet and Activity recommendation:  As advised  Discharge Instructions     Discharge Instructions    Call MD for:  difficulty breathing, headache or visual disturbances   Complete by:  As directed    Call MD for:  persistant dizziness or light-headedness   Complete by:  As directed    Call MD for:  severe uncontrolled pain   Complete by:  As directed    Call MD for:  temperature >100.4   Complete by:  As directed    Diet - low sodium heart healthy   Complete by:  As directed    Discharge instructions   Complete by:  As directed    1)Take Medications as advised 2)Follow up with your Primary Care Physician in 1 week for Recheck 3)Use Oxygen as prescribed continuously at 2 L/min via [place]   Increase activity slowly   Complete by:  As directed         Discharge Medications     Allergies as of 01/25/2018      Reactions   Bee Venom Anaphylaxis, Shortness Of Breath, Swelling, Other (See Comments)   Swelling at site    Amlodipine Swelling, Other (See Comments)   Swelling of the ankles and hands    Levofloxacin Other (See Comments)   Joint pain    Fosamax [alendronate Sodium]    Joint pain   Hctz [hydrochlorothiazide] Palpitations, Other (See Comments)   Sweating       Medication List    TAKE these medications   acetaminophen 500 MG tablet Commonly known as:  TYLENOL Take 500 mg by mouth every 6 (six) hours as needed for mild pain, moderate pain, fever or headache. Reported on 05/24/2016   albuterol (2.5 MG/3ML) 0.083% nebulizer solution Commonly known as:  PROVENTIL Take 3 mLs (2.5 mg total) by nebulization every 2 (two) hours as needed for wheezing. What changed:  You were already taking a  medication with the same name, and this prescription was added. Make sure you understand how and when to take each.   albuterol 108 (90 Base) MCG/ACT inhaler Commonly known as:  PROVENTIL HFA;VENTOLIN HFA Inhale 2 puffs into the lungs every 4 (four) hours as needed for wheezing or shortness of breath. What changed:  Another medication with the same name was added. Make sure you understand how and when to take each.   aspirin EC 81 MG tablet Take 81 mg by mouth daily. Reported on 04/11/2016   atorvastatin 40 MG tablet Commonly known as:  LIPITOR TAKE ONE TABLET BY MOUTH ONCE DAILY.   cholecalciferol 1000 units tablet Commonly known as:  VITAMIN D Take 1 tablet by mouth daily.   VITAMIN D3 PO Take 1,200 mg by mouth daily.   famotidine 20 MG tablet Commonly known as:  PEPCID Take 1 tablet (20 mg total) by mouth 2 (two) times daily.   fish oil-omega-3 fatty acids 1000 MG capsule Take 1 capsule by mouth daily.   Fluticasone-Salmeterol 250-50 MCG/DOSE Aepb Commonly known as:  ADVAIR Inhale 1 puff into the lungs 2 (two) times daily.   gabapentin 300 MG capsule Commonly known as:  NEURONTIN Take 1 capsule (300 mg total) by mouth 3 (three) times daily.   guaiFENesin 600 MG 12 hr tablet Commonly known as:  MUCINEX Take 1 tablet (600 mg total) by mouth 2 (two) times daily.   ipratropium-albuterol 0.5-2.5 (3) MG/3ML Soln Commonly known as:  DUONEB Take 3 mLs by nebulization 3 (three) times daily.   metoprolol succinate 50 MG 24 hr tablet Commonly known as:  TOPROL-XL Take 1 tablet (50 mg total) by mouth daily. Take with or immediately following a meal.   multivitamin with minerals Tabs tablet Take 1 tablet by mouth daily. Start taking on:  01/26/2018   ondansetron 4 MG tablet Commonly known as:  ZOFRAN Take 1 tablet (4 mg total) by mouth every 6 (six) hours as needed for nausea.   oseltamivir 30 MG capsule Commonly  known as:  TAMIFLU Take 1 capsule (30 mg total) by mouth 2  (two) times daily.   polyethylene glycol packet Commonly known as:  MIRALAX / GLYCOLAX Take 17 g by mouth daily as needed for mild constipation.   predniSONE 20 MG tablet Commonly known as:  DELTASONE Take 2 tablets (40 mg total) by mouth daily with breakfast. Start taking on:  01/26/2018   senna 8.6 MG Tabs tablet Commonly known as:  SENOKOT Take 1 tablet (8.6 mg total) by mouth 2 (two) times daily.   traZODone 50 MG tablet Commonly known as:  DESYREL Take 0.5-1 tablets (25-50 mg total) by mouth at bedtime as needed for sleep.            Durable Medical Equipment  (From admission, onward)        Start     Ordered   01/25/18 1112  For home use only DME oxygen  Once    Comments:  Please evaluate for light weight POC, simplygo  Question Answer Comment  Mode or (Route) Nasal cannula   Liters per Minute 2   Frequency Continuous (stationary and portable oxygen unit needed)   Oxygen delivery system Gas      01/25/18 1112      Major procedures and Radiology Reports - PLEASE review detailed and final reports for all details, in brief -    Dg Chest 2 View  Result Date: 01/23/2018 CLINICAL DATA:  Cough EXAM: CHEST - 2 VIEW COMPARISON:  Chest CT, 11/11/2017.  Chest radiographs, 12/08/2016. FINDINGS: Cardiac silhouette is normal in size. No mediastinal or hilar masses. No evidence of adenopathy. Lungs are hyperexpanded. There are prominent bronchovascular markings and several reticular type opacities at the lung bases, the latter finding consistent with scarring or subsegmental atelectasis. No evidence of pneumonia or pulmonary edema. No pleural effusion or pneumothorax. Two upper thoracic vertebral fractures have been treated with kyphoplasty, new since the prior study. IMPRESSION: 1. No acute cardiopulmonary disease.  No evidence of pneumonia. 2. COPD. Electronically Signed   By: Lajean Manes M.D.   On: 01/23/2018 10:33    Micro Results   No results found for this or any  previous visit (from the past 240 hour(s)).  Today   Subjective    Tammy Ball today has no fevers, patient feels better, continues to require oxygen, no vomiting no diarrhea, no chest pains, cough         Patient has been seen and examined prior to discharge   Objective   Blood pressure (!) 105/58, pulse 74, temperature 98.2 F (36.8 C), temperature source Oral, resp. rate 18, height 5\' 4"  (1.626 m), weight 79 kg (174 lb 1.6 oz), SpO2 (!) 79 %.   Intake/Output Summary (Last 24 hours) at 01/25/2018 1325 Last data filed at 01/25/2018 0954 Gross per 24 hour  Intake 500.42 ml  Output -  Net 500.42 ml    Exam  Physical Examination: General appearance - alert, in no acute distress Mental status - alert, oriented to person, place, and time, Nose- [place] 2 L/min Eyes - sclera anicteric Neck - supple, no JVD elevation , Chest -diminished breath sounds bilaterally, more rhonchi and wheezing heart - S1 and S2 normal,HR 104 Abdomen - soft, nontender, nondistended, no masses or organomegaly Neurological - screening mental status exam normal, neck supple without rigidity, cranial nerves II through XII intact, DTR's normal and symmetric Extremities - no pedal edema noted, intact peripheral pulses  Skin - warm, dry, right upper trunk/flank with  crusting vesicular lesions in dermatomal a pattern consistent with recent shingles infection   Data Review   CBC w Diff:  Lab Results  Component Value Date   WBC 13.4 (H) 01/23/2018   HGB 13.1 01/23/2018   HGB 13.7 11/11/2017   HCT 39.3 01/23/2018   HCT 41.9 11/11/2017   PLT 266 01/23/2018   PLT 282 11/11/2017   PLT 311 03/05/2017   LYMPHOPCT 9 01/23/2018   LYMPHOPCT 22.9 11/11/2017   MONOPCT 7 01/23/2018   MONOPCT 11.1 11/11/2017   EOSPCT 0 01/23/2018   EOSPCT 3.6 11/11/2017   BASOPCT 0 01/23/2018   BASOPCT 0.6 11/11/2017    CMP:  Lab Results  Component Value Date   NA 133 (L) 01/24/2018   NA 141 11/13/2017   NA 140  11/11/2017   K 4.6 01/24/2018   K 4.6 11/11/2017   CL 97 (L) 01/24/2018   CO2 25 01/24/2018   CO2 28 11/11/2017   BUN 19 01/24/2018   BUN 19 11/13/2017   BUN 30.3 (H) 11/11/2017   CREATININE 1.17 (H) 01/24/2018   CREATININE 1.3 (H) 11/11/2017   GLU 105 12/10/2009   PROT 8.0 01/23/2018   PROT 7.2 11/13/2017   PROT 7.4 11/11/2017   ALBUMIN 3.8 01/23/2018   ALBUMIN 4.4 11/13/2017   ALBUMIN 4.0 11/11/2017   BILITOT 0.7 01/23/2018   BILITOT 0.4 11/13/2017   BILITOT 0.42 11/11/2017   ALKPHOS 90 01/23/2018   ALKPHOS 102 11/11/2017   AST 19 01/23/2018   AST 18 11/11/2017   ALT 15 01/23/2018   ALT 19 11/11/2017  .   Total Discharge time is about 33 minutes  Roxan Hockey M.D on 01/25/2018 at 1:25 PM  [facility]   Office  [phone]  Voice Recognition Viviann Spare dictation system was used to create this note, attempts have been made to correct errors. Please contact the author with questions and/or clarifications.

## 2018-01-25 NOTE — Progress Notes (Signed)
Pt to be discharged to home this afternoon. Pt given discharge teaching including medication teaching and schedules. Oxygen has been delivered to room for Pt to take home. Pt verbalized understanding of all discharge teaching and use of oxygen. AVS/discharge packet and prescription  With Pt at time of discharge

## 2018-01-25 NOTE — Care Management Note (Addendum)
Case Management Note  Patient Details  Name: Tammy Ball MRN: [id number] Date of Birth: January 10, 1941  Subjective/Objective:     COPD exac, Influenza A, AKI          Action/Plan: NCM spoke to pt at bedside. Explained [facility] will deliver portable tank to room and oxygen concentrator to home.  Has three children to assist at home. She has nebulizer machine and RW at home. Lives alone but independent at home.   Expected Discharge Date: 01/25/2018               Expected Discharge Plan:  Home/Self Care  In-House Referral:  NA  Discharge planning Services  CM Consult  Post Acute Care Choice:  NA Choice offered to:  NA  DME Arranged:  Oxygen DME Agency:  [place]:  NA [place] Agency:  NA  Status of Service:  Completed, signed off  If discussed at [place] of Stay Meetings, dates discussed:    Additional Comments:  Erenest Rasher, RN 01/25/2018, 11:15 AM

## 2018-01-28 ENCOUNTER — Telehealth: Payer: Self-pay | Admitting: Family Medicine

## 2018-01-28 NOTE — Telephone Encounter (Signed)
Call pharmacy to determine WHY they will not fill nebulizer.  Then call patient and review medications on discharge summary from hospital.

## 2018-01-28 NOTE — Telephone Encounter (Signed)
Copied from [place]. Topic: Quick Communication - See Telephone Encounter >> Jan 28, 2018 11:27 AM Synthia Innocent wrote: CRM for notification. See Telephone encounter for: Patient DC from hospital on 01/23/18, unsure what medications she is suppose to be on, pharmacy will not fill nebulizer. Would like to know what meds she is suppose to be on, has follow up appt on 01/29/18  01/28/18.

## 2018-01-29 ENCOUNTER — Telehealth: Payer: Self-pay

## 2018-01-29 ENCOUNTER — Other Ambulatory Visit: Payer: Self-pay

## 2018-01-29 ENCOUNTER — Ambulatory Visit (INDEPENDENT_AMBULATORY_CARE_PROVIDER_SITE_OTHER): Payer: Medicare Other | Admitting: Family Medicine

## 2018-01-29 ENCOUNTER — Encounter: Payer: Self-pay | Admitting: Family Medicine

## 2018-01-29 VITALS — BP 150/100 | HR 84 | Ht 64.0 in | Wt 171.0 lb

## 2018-01-29 DIAGNOSIS — B029 Zoster without complications: Secondary | ICD-10-CM | POA: Diagnosis not present

## 2018-01-29 DIAGNOSIS — N289 Disorder of kidney and ureter, unspecified: Secondary | ICD-10-CM

## 2018-01-29 DIAGNOSIS — C3491 Malignant neoplasm of unspecified part of right bronchus or lung: Secondary | ICD-10-CM

## 2018-01-29 DIAGNOSIS — J101 Influenza due to other identified influenza virus with other respiratory manifestations: Secondary | ICD-10-CM | POA: Diagnosis not present

## 2018-01-29 DIAGNOSIS — J441 Chronic obstructive pulmonary disease with (acute) exacerbation: Secondary | ICD-10-CM

## 2018-01-29 DIAGNOSIS — Z902 Acquired absence of lung [part of]: Secondary | ICD-10-CM

## 2018-01-29 DIAGNOSIS — I10 Essential (primary) hypertension: Secondary | ICD-10-CM

## 2018-01-29 MED ORDER — TIZANIDINE HCL 2 MG PO CAPS: 2.0000 mg | ORAL_CAPSULE | Freq: Three times a day (TID) | ORAL | 0 refills | Status: DC | PRN

## 2018-01-29 MED ORDER — PREDNISONE 20 MG PO TABS: 40.0000 mg | ORAL_TABLET | Freq: Every day | ORAL | 0 refills | Status: DC

## 2018-01-29 MED ORDER — GUAIFENESIN-CODEINE 100-10 MG/5ML PO SOLN: 5.0000 mL | Freq: Four times a day (QID) | ORAL | 0 refills | Status: DC | PRN

## 2018-01-29 NOTE — Telephone Encounter (Signed)
Phone call to [facility] on [address], spoke with [place]. She states she has spoken with patient regarding nebulizer solutions. They would require prior authorization through [employer] states patient would like these placed on discount card. Will return call to office if prior authorization needed.  Patient seen at clinic today to follow up from hospitalization.

## 2018-01-29 NOTE — Telephone Encounter (Signed)
Called pt to follow-up after appointment today  about her blood pressure and headaches. Spoke to pt and she informed me that her B/P had come down to 132/81. Pt also informed me that she is no longer having the headachs after taking her Medications.  I verbalized understanding and informed her to give [place] a call back at the office if she has anymore questions.

## 2018-01-29 NOTE — Progress Notes (Signed)
Subjective:    Patient ID: Tammy Ball, female    DOB: Aug 03, 1941, 77 y.o.   MRN: [id number]  01/29/2018  Hospitalization Follow-up (was seen in the ER on 3/14 for [facility] and Influenza )    HPI This 77 y.o. female presents for [place] for COPD exacerbation secondary to influenza A infection.  Using nebulizer solution from two years ago.  Insurance would not approve nebulizer solution.  Much improved from hospitalization.  Has been trying to receive new nebulizer solution.  Denies fever/chills/sweats.  +fatigue.  +cough improved. +wheezing has improved.    Details of discharge summary are as follows: Admission date:  01/23/2018  Admitting Physician  Courage Denton Brick, MD Discharge Date:  01/25/2018  Primary MD  Wardell Honour, MD Recommendations for primary care physician for things to follow:  1)Take Medications as advised 2)Follow up with your Primary Care Physician in 1 week for Recheck 3)Use Oxygen as prescribed continuously at 2 L/min via [place]  Admission Diagnosis  Shortness of breath [R06.02] Influenza [J11.1] Herpes zoster without complication [T73.2] Discharge Diagnosis  Shortness of breath [R06.02] Influenza [J11.1] Herpes zoster without complication [K02.5]   Principal Problem:   AKI (acute kidney injury) ([place]) Active Problems:   Essential hypertension   COPD GOLD II    Influenza A  Tammy Ball a60 y.o.femalewho is a reformed smoker with previous history of lung cancer status post previous lobectomy and previous radiation therapy as well as history of renal carcinoma, COPD Gold stage II and hypertension who presents to the ED from PCPs office/urgent care with concerns about worsening respiratory symptoms and hypoxia. Patient has had URI symptoms for over 4 days,she has been using a bronchodilators without much relief she has had nausea but no vomiting no diarrhea, oral intake has been decreased In Urgent care/ED---workup was consistent with acute  kidney injury with worsening creatinine, as well as positive influenza A, chest x-ray was without pneumonia or other acute findings Despite bronchodilators patient's O2 sats remained borderline low,please note that the patient was not previously on oxygen, patient was also tachypneic and tachycardic.Patient had chest discomfort only with cough otherwise no frank chest pains, she did have fevers and chills.  Hospital Course:    1)AKI- suspect acute kidney injury secondary to dehydration in the setting of fevers, poor appetite and decreased oral intake secondary to influenza A,AKI hasresolved, creatinine is back to 1.1 from 1.4 on admission,no vomiting or diarrhea, avoid nephrotoxic agents, patient only has one kidney 2)Influenza A-hypoxia persisted, okay to completeTamiflu as ordered, bronchodilators, mucolytics and supportive and symptomatic treatment as ordered,she had a flu shoton August 23, 2017,chest x-ray without pneumonia clinically no evidence of pneumonia.  May discharge home with home oxygen due to underlying COPD 3)acute COPD exacerbation with concerns about hypoxia-secondary to #2 above, okay to discharge home on home oxygen due to underlying COPD, okay to complete prednisone, mucolytics, bronchodilators.  at baseline patient has COPD Gold stage II, she is a reformed smoker.Patient also has history of underlying lung cancer with previous radiation therapy and lobectomy,chest x-ray without pneumonia clinically no evidence of pneumonia, hold off on antibiotics this time.Prior to admission patient was not on oxygen 4)HTN-stable, continue Toprol-XL 50 mg daily, 5)Acute Hypoxic Respiratory Failure secondary to #2 #3 above, treat as outlined in #2 and#3 above---continues to require oxygen,okay to discharge home on home oxygen due to underlying COPD, 6)Rt Flank Shingles-crusted lesions, appears to be resolving,  patient is largely asymptomatic Code  Status:Full Disposition Plan:home Discharge Condition: Stable Follow  UP-PCP    [place] Follow up.   Why:  Home Oxygen Concentrator will be delivered to home Contact information: [address] [place] [address] [phone]   Medication List             TAKE these medications            acetaminophen 500 MG tablet Commonly known as:  TYLENOL Take 500 mg by mouth every 6 (six) hours as needed for mild pain, moderate pain, fever or headache. Reported on 05/24/2016    albuterol (2.5 MG/3ML) 0.083% nebulizer solution Commonly known as:  PROVENTIL Take 3 mLs (2.5 mg total) by nebulization every 2 (two) hours as needed for wheezing. What changed:  You were already taking a medication with the same name, and this prescription was added. Make sure you understand how and when to take each.    albuterol 108 (90 Base) MCG/ACT inhaler Commonly known as:  PROVENTIL HFA;VENTOLIN HFA Inhale 2 puffs into the lungs every 4 (four) hours as needed for wheezing or shortness of breath. What changed:  Another medication with the same name was added. Make sure you understand how and when to take each.    aspirin EC 81 MG tablet Take 81 mg by mouth daily. Reported on 04/11/2016    atorvastatin 40 MG tablet Commonly known as:  LIPITOR TAKE ONE TABLET BY MOUTH ONCE DAILY.    cholecalciferol 1000 units tablet Commonly known as:  VITAMIN D Take 1 tablet by mouth daily.    VITAMIN D3 PO Take 1,200 mg by mouth daily.    famotidine 20 MG tablet Commonly known as:  PEPCID Take 1 tablet (20 mg total) by mouth 2 (two) times daily.    fish oil-omega-3 fatty acids 1000 MG capsule Take 1 capsule by mouth daily.    Fluticasone-Salmeterol 250-50 MCG/DOSE Aepb Commonly known as:  ADVAIR Inhale 1 puff into the lungs 2 (two) times daily.    gabapentin 300 MG capsule Commonly known as:  NEURONTIN Take 1 capsule (300 mg total)  by mouth 3 (three) times daily.    guaiFENesin 600 MG 12 hr tablet Commonly known as:  MUCINEX Take 1 tablet (600 mg total) by mouth 2 (two) times daily.    ipratropium-albuterol 0.5-2.5 (3) MG/3ML Soln Commonly known as:  DUONEB Take 3 mLs by nebulization 3 (three) times daily.    metoprolol succinate 50 MG 24 hr tablet Commonly known as:  TOPROL-XL Take 1 tablet (50 mg total) by mouth daily. Take with or immediately following a meal.    multivitamin with minerals Tabs tablet Take 1 tablet by mouth daily. Start taking on:  01/26/2018    ondansetron 4 MG tablet Commonly known as:  ZOFRAN Take 1 tablet (4 mg total) by mouth every 6 (six) hours as needed for nausea.    oseltamivir 30 MG capsule Commonly known as:  TAMIFLU Take 1 capsule (30 mg total) by mouth 2 (two) times daily.    polyethylene glycol packet Commonly known as:  MIRALAX / GLYCOLAX Take 17 g by mouth daily as needed for mild constipation.    predniSONE 20 MG tablet Commonly known as:  DELTASONE Take 2 tablets (40 mg total) by mouth daily with breakfast. Start taking on:  01/26/2018    senna 8.6 MG Tabs tablet Commonly known as:  SENOKOT Take 1 tablet (8.6 mg total) by mouth 2 (two) times daily.    traZODone 50 MG  tablet Commonly known as:  DESYREL Take 0.5-1 tablets (25-50 mg total) by mouth at bedtime as needed for sleep.                                         Durable Medical Equipment  (From admission, onward)              Start     Ordered   01/25/18 1112  For home use only DME oxygen  Once    Comments:  Please evaluate for light weight POC, simplygo  Question Answer Comment  Mode or (Route) Nasal cannula   Liters per Minute 2   Frequency Continuous (stationary and portable oxygen unit needed)   Oxygen delivery system Gas      01/25/18 1112      BP Readings from Last 3 Encounters:  03/04/18 134/80  02/25/18 (!) 169/103  02/21/18 138/88   Wt Readings from  Last 3 Encounters:  03/04/18 170 lb (77.1 kg)  02/25/18 171 lb 6.4 oz (77.7 kg)  02/21/18 170 lb (77.1 kg)   Immunization History  Administered Date(s) Administered  . Hepatitis B 11/12/1998  . Influenza Split 09/12/2012  . Influenza, High Dose Seasonal PF 08/23/2017  . Influenza,inj,Quad PF,6+ Mos 07/18/2013  . Influenza-Unspecified 07/29/2014, 07/23/2015, 08/25/2016  . Pneumococcal Conjugate-13 07/29/2015  . Pneumococcal Polysaccharide-23 11/12/2000, 10/23/2012, 08/23/2017  . Pneumococcal-Unspecified 10/23/2012, 08/23/2017  . Tdap 12/02/2013  . Zoster 12/13/2010    Review of Systems  Constitutional: Negative for chills, diaphoresis, fatigue and fever.  HENT: Negative for congestion, ear pain, postnasal drip, rhinorrhea, sinus pressure, sinus pain, sore throat, trouble swallowing and voice change.   Eyes: Negative for visual disturbance.  Respiratory: Positive for cough and wheezing. Negative for shortness of breath.   Cardiovascular: Negative for chest pain, palpitations and leg swelling.  Gastrointestinal: Negative for abdominal pain, constipation, diarrhea, nausea and vomiting.  Endocrine: Negative for cold intolerance, heat intolerance, polydipsia, polyphagia and polyuria.  Skin: Positive for rash.  Neurological: Negative for dizziness, tremors, seizures, syncope, facial asymmetry, speech difficulty, weakness, light-headedness, numbness and headaches.  Psychiatric/Behavioral: Negative for dysphoric mood.    Past Medical History:  Diagnosis Date  . Anemia    was while doing chemo  . Anxiety   . Asthma   . Chemotherapy-induced neuropathy ([place]) 11/01/2015  . Claustrophobia   . COPD (chronic obstructive pulmonary disease) ([place])   . Depression   . Encounter for antineoplastic chemotherapy 02/09/2016  . Glaucoma   . Headache    prior to menopause  . Heart murmur   . History of echocardiogram    Echo 2/17: EF 95-28%, grade 1 diastolic dysfunction, mild MR, trivial  pericardial effusion  . History of nuclear stress test    Myoview 2/17: no ischemia or scar, EF 79%; low risk  . History of radiation therapy 10/30/16-11/06/16  . Hyperlipidemia   . Hypertension   . Insomnia 05/16/2016  . Non-small cell carcinoma of right lung, stage 2 ([place]) 10/03/2015  . Radiation 02/29/16-04/10/16   50.4 Gy to right central chest  . Renal cell carcinoma ([place])    L nephrectomy  in 2012   Past Surgical History:  Procedure Laterality Date  . BACK SURGERY     cervical 1991  . EYE SURGERY    . IR GENERIC HISTORICAL  01/16/2017   IR RADIOLOGIST EVAL & MGMT 01/16/2017 [facility]  . IR GENERIC  HISTORICAL  01/31/2017   IR FLUORO GUIDED NEEDLE PLC ASPIRATION/INJECTION LOC 01/31/2017 Luanne Bras, MD [facility]  . IR GENERIC HISTORICAL  01/31/2017   IR VERTEBROPLASTY CERV/THOR BX INC UNI/BIL INC/INJECT/IMAGING 01/31/2017 Luanne Bras, MD [facility]  . IR GENERIC HISTORICAL  01/31/2017   IR VERTEBROPLASTY EA ADDL (T&LS) BX INC UNI/BIL INC INJECT/IMAGING 01/31/2017 Luanne Bras, MD [facility]  . kidney cancer    . NEPHRECTOMY    . SPINE SURGERY    . TUBAL LIGATION    . VIDEO ASSISTED THORACOSCOPY (VATS)/WEDGE RESECTION Right 01/18/2016   Procedure: VIDEO ASSISTED THORACOSCOPY (VATS)/LUNG RESECTION, THOROCOTOMY, RIGHT UPPER LOBECTOMY, LYMPH NODE DISSECTION, PLACEMENT OF ON Q;  Surgeon: [name], MD;  Location: [facility];  Service: Thoracic;  Laterality: Right;  Marland Kitchen VIDEO BRONCHOSCOPY Bilateral 09/20/2015   Procedure: VIDEO BRONCHOSCOPY WITHOUT FLUORO;  Surgeon: Rigoberto Noel, MD;  Location: [facility];  Service: Cardiopulmonary;  Laterality: Bilateral;  . VIDEO BRONCHOSCOPY N/A 01/18/2016   Procedure: VIDEO BRONCHOSCOPY;  Surgeon: [name], MD;  Location: [facility] OR;  Service: Thoracic;  Laterality: N/A;   Allergies  Allergen Reactions  . Bee Venom Anaphylaxis, Shortness Of Breath, Swelling and Other (See Comments)    Swelling at site   . Amlodipine  Swelling and Other (See Comments)    Swelling of the ankles and hands   . Levofloxacin Other (See Comments)    Joint pain   . Fosamax [Alendronate Sodium]     Joint pain  . Hctz [Hydrochlorothiazide] Palpitations and Other (See Comments)    Sweating    Current Outpatient Medications on File Prior to Visit  Medication Sig Dispense Refill  . acetaminophen (TYLENOL) 500 MG tablet Take 500 mg by mouth every 6 (six) hours as needed for mild pain, moderate pain, fever or headache. Reported on 05/24/2016    . aspirin EC 81 MG tablet Take 81 mg by mouth daily. Reported on 04/11/2016    . atorvastatin (LIPITOR) 40 MG tablet TAKE ONE TABLET BY MOUTH ONCE DAILY. 90 tablet 3  . calcium carbonate (TUMS EX) 750 MG chewable tablet Chew 1 tablet by mouth as needed for heartburn.    . fish oil-omega-3 fatty acids 1000 MG capsule Take 1 capsule by mouth daily.     . traMADol (ULTRAM) 50 MG tablet Take by mouth every 6 (six) hours as needed (BACK PAIN).    Marland Kitchen traZODone (DESYREL) 50 MG tablet Take 0.5-1 tablets (25-50 mg total) by mouth at bedtime as needed for sleep. 90 tablet 1  . Cholecalciferol (VITAMIN D3 PO) Take 1,200 mg by mouth daily.    . Fluticasone-Salmeterol (ADVAIR) 250-50 MCG/DOSE AEPB Inhale 1 puff into the lungs 2 (two) times daily. 60 each 4   No current facility-administered medications on file prior to visit.    Social History   Socioeconomic History  . Marital status: Widowed    Spouse name: Not on file  . Number of children: 4  . Years of education: Not on file  . Highest education level: High school graduate  Occupational History  . Not on file  Social Needs  . Financial resource strain: Not hard at all  . Food insecurity:    Worry: Never true    Inability: Never true  . Transportation needs:    Medical: No    Non-medical: No  Tobacco Use  . Smoking status: Former Smoker    Packs/day: 0.50    Years: 50.00    Pack years: 25.00  Types: Cigarettes    Last attempt to  quit: 03/16/2011    Years since quitting: 7.0  . Smokeless tobacco: Never Used  Substance and Sexual Activity  . Alcohol use: No    Alcohol/week: 0.0 oz  . Drug use: No  . Sexual activity: Not Currently  Lifestyle  . Physical activity:    Days per week: 0 days    Minutes per session: 0 min  . Stress: Only a little  Relationships  . Social connections:    Talks on phone: More than three times a week    Gets together: More than three times a week    Attends religious service: More than 4 times per year    Active member of club or organization: Yes    Attends meetings of clubs or organizations: More than 4 times per year    Relationship status: Widowed  . Intimate partner violence:    Fear of current or ex partner: No    Emotionally abused: No    Physically abused: No    Forced sexual activity: No  Other Topics Concern  . Not on file  Social History Narrative   Marital status: divorced; not dating in 2019.      Children: 4 children; 3 grandchildren adult; 4 gg.      Lives: alone in house      Employment: part-time work; substance abuse counselor; [employer].      Tobacco: quit smoking 2012; smoked 45 years      Alcohol: none      Drugs: none      Exercise: none in 2019; due to LEFT sciatica.      ADLs: independent with ADLs; drives.       Advanced Directives: YES: HCPOA: Nicholas Martinez/son.  FULL CODE but no prolonged measures.      Occupation: Substance Abuse Estate agent   No exercise** Merged History Encounter **       ** Data from: 12/14/11 Enc Dept: [facility]       ** Data from: 12/17/11 Enc Dept: [facility]   Substance abuse counselor   Husband deceased   4 great grandchildren   Son works in same substance abuse counseling center as patient   Family History  Problem Relation Age of Onset  . Heart disease Sister   . Obesity Brother   . Heart attack Daughter 21       s/p CABG  . Glaucoma Daughter   .  Breast cancer Sister   . Birth defects Sister   . Cancer Mother        Bladder Cancer  . Hypertension Mother   . CAD Mother 25  . Cancer Maternal Grandmother        Objective:    BP (!) 150/100 (BP Location: Left Arm, Patient Position: Sitting, Cuff Size: Normal)   Pulse 84   Ht 5\' 4"  (1.626 m)   Wt 171 lb (77.6 kg)   SpO2 94%   BMI 29.35 kg/m  Physical Exam  Constitutional: She is oriented to person, place, and time. She appears well-developed and well-nourished. No distress.  HENT:  Head: Normocephalic and atraumatic.  Right Ear: External ear normal.  Left Ear: External ear normal.  Nose: Nose normal.  Mouth/Throat: Oropharynx is clear and moist.  Eyes: Conjunctivae and EOM are normal. Pupils are equal, round, and reactive to light.  Neck: Normal range of motion. Neck supple. Carotid bruit is  not present. No thyromegaly present.  Cardiovascular: Normal rate, regular rhythm, normal heart sounds and intact distal pulses. Exam reveals no gallop and no friction rub.  No murmur heard. Pulmonary/Chest: Effort normal. No accessory muscle usage. No tachypnea. No respiratory distress. She has wheezes in the right lower field and the left lower field. She has no rhonchi. She has no rales.  Abdominal: Soft. Bowel sounds are normal. She exhibits no distension and no mass. There is no tenderness. There is no rebound and no guarding.  Lymphadenopathy:    She has no cervical adenopathy.  Neurological: She is alert and oriented to person, place, and time. No cranial nerve deficit.  Skin: Skin is warm and dry. No rash noted. She is not diaphoretic. No erythema. No pallor.  Psychiatric: She has a normal mood and affect. Her behavior is normal.   No results found. Depression screen [facility] 2/9 03/04/2018 01/23/2018 11/13/2017 11/13/2017 03/05/2017  Decreased Interest 0 0 0 0 0  Down, Depressed, Hopeless 0 0 0 1 0  PHQ - 2 Score 0 0 0 1 0  Some recent data might be hidden   Fall Risk  03/04/2018  11/13/2017 11/13/2017 03/05/2017 02/18/2017  Falls in the past year? No No No No No        Assessment & Plan:   1. COPD exacerbation ([place])   2. Influenza A   3. Renal insufficiency   4. Herpes zoster without complication   5. Non-small cell carcinoma of right lung, stage 2 ([place])   6. S/P lobectomy of lung   7. Essential hypertension    S/p admission for COPD exacerbation due to acute influenza A infection ;s/p admission; complete Tamiflu therapy; extension of prednisone taper provided today; rx for Robitussin with codeine provided; clarify barrier to filling albuterol nebulizer solution with pharmacy; continue nebulizer treatments every 6 hours for the next week and then PRN.  Continue oxygen supplementation as needed.  Obtain labs.  Admission records reviewed in detail during visit today.  Non-small cell carcinoma RIGHT lung stage 2: s/p lobectomy; managed by oncology.  Acute kidney injury: New onset due to acute influenza infection; resolved with gentle iv hydration; repeat labs today; encourage increased water intake at home.  Herpes Zoster infection: New; onset with influenza A infection; resolving.  Minimal symptoms/pain.  HTN: elevated today; patient with headache during visit; recommend monitoring blood pressure closely at home for the next week and to call office with BP update.   Orders Placed This Encounter  Procedures  . CBC with Differential/Platelet  . Comprehensive metabolic panel   Meds ordered this encounter  Medications  . DISCONTD: predniSONE (DELTASONE) 20 MG tablet    Sig: Take 2 tablets (40 mg total) by mouth daily with breakfast. For 5 days, Then 1 daily for 5 days    Dispense:  10 tablet    Refill:  0  . tizanidine (ZANAFLEX) 2 MG capsule    Sig: Take 1 capsule (2 mg total) by mouth 3 (three) times daily as needed for muscle spasms.    Dispense:  60 capsule    Refill:  0  . DISCONTD: guaiFENesin-codeine 100-10 MG/5ML syrup    Sig: Take 5 mLs by mouth every 6  (six) hours as needed for cough.    Dispense:  120 mL    Refill:  0    Return in about 3 months (around 05/01/2018), or if symptoms worsen or fail to improve, for follow-up chronic medical conditions.   Ridhi Hoffert Elayne Guerin,  M.D. Primary Care at [facility] previously [facility] [place] [address] [place], [place]  [address] [phone] phone [phone] fax

## 2018-01-29 NOTE — Patient Outreach (Signed)
[facility]) Care Management  01/29/2018  Tammy Ball 24-Sep-1941 [id number]     EMMI-GENERAL DISCHARGE RED ON EMMI ALERT Day # 1 Date: 01/28/18 Red Alert Reason: " Transportation to follow up? No   Unfilled prescriptions? Yes  Able to fill today/tomorrow? No"   Outreach attempt # 1 to patient. Patient did not wish to speak long as she voiced that she was getting ready for her MD appt today. She immediately began discussing how she was not satisfied with her care while in the hospital due to extended wait times and hospital crowded with sick people.Encouraged to relay her experiences to patient care experiences. She states that she wants someone to contact the MD that saw her in the hospital to get med issue squared. RN CM attempted to explain to patient that once discharged from the [facility] she is no longer under hospital MD care but issues and concerns are to be taken up with her PCP but she did not like response and did not want to hear it. She reports that there is a big mix up in regards to her medicine that goes in nebulizer machine. She voices her pharmacy would not fill it as it was a duplication of other meds. She called her PCP office to advise them. She reports she was supposed to hear back from pharmacy and MD office yesterday and she never did. Patient very upset during call and RN CM attempted to calm patient down but unable to do so. She has PCP follow up appt today. RN CM advised patient to take all her meds along with her discharge paperwork to have issue resolved. She voiced understanding.  Advised patient that they would get one more automated EMMI-GENERAL post discharge calls to assess how they are doing following recent hospitalization and will receive a call from a nurse if any of their responses were abnormal. Patient abruptly hung up the phone.   Plan: RN CM will notify [facility] administrative assistant of case status.    Enzo Montgomery, RN,BSN,CCM [facility] Telephonic Care Management Coordinator Direct Phone: [phone] Toll Free: [phone] Fax: [phone]

## 2018-01-29 NOTE — Patient Instructions (Signed)
     IF you received an x-ray today, you will receive an invoice from [facility]. Please contact [facility] at [phone] with questions or concerns regarding your invoice.   IF you received labwork today, you will receive an invoice from [facility]. Please contact [facility] at [phone] with questions or concerns regarding your invoice.   Our billing staff will not be able to assist you with questions regarding bills from these companies.  You will be contacted with the lab results as soon as they are available. The fastest way to get your results is to activate your My Chart account. Instructions are located on the last page of this paperwork. If you have not heard from us regarding the results in 2 weeks, please contact this office.     

## 2018-01-30 LAB — CBC WITH DIFFERENTIAL/PLATELET
Basophils Absolute: 0 10*3/uL (ref 0.0–0.2)
Basos: 0 %
EOS (ABSOLUTE): 0 10*3/uL (ref 0.0–0.4)
Eos: 0 %
Hematocrit: 42.5 % (ref 34.0–46.6)
Hemoglobin: 13.6 g/dL (ref 11.1–15.9)
Immature Grans (Abs): 0.2 10*3/uL — ABNORMAL HIGH (ref 0.0–0.1)
Immature Granulocytes: 2 %
Lymphocytes Absolute: 0.9 10*3/uL (ref 0.7–3.1)
Lymphs: 7 %
MCH: 30.6 pg (ref 26.6–33.0)
MCHC: 32 g/dL (ref 31.5–35.7)
MCV: 96 fL (ref 79–97)
Monocytes Absolute: 0.5 10*3/uL (ref 0.1–0.9)
Monocytes: 4 %
Neutrophils Absolute: 11.2 10*3/uL — ABNORMAL HIGH (ref 1.4–7.0)
Neutrophils: 87 %
Platelets: 419 10*3/uL — ABNORMAL HIGH (ref 150–379)
RBC: 4.44 x10E6/uL (ref 3.77–5.28)
RDW: 16.1 % — ABNORMAL HIGH (ref 12.3–15.4)
WBC: 12.8 10*3/uL — ABNORMAL HIGH (ref 3.4–10.8)

## 2018-01-30 LAB — COMPREHENSIVE METABOLIC PANEL
ALT: 27 IU/L (ref 0–32)
AST: 20 IU/L (ref 0–40)
Albumin/Globulin Ratio: 1.5 (ref 1.2–2.2)
Albumin: 4.2 g/dL (ref 3.5–4.8)
Alkaline Phosphatase: 93 IU/L (ref 39–117)
BUN/Creatinine Ratio: 17 (ref 12–28)
BUN: 19 mg/dL (ref 8–27)
Bilirubin Total: 0.6 mg/dL (ref 0.0–1.2)
CO2: 29 mmol/L (ref 20–29)
Calcium: 9.8 mg/dL (ref 8.7–10.3)
Chloride: 92 mmol/L — ABNORMAL LOW (ref 96–106)
Creatinine, Ser: 1.14 mg/dL — ABNORMAL HIGH (ref 0.57–1.00)
GFR calc Af Amer: 55 mL/min/{1.73_m2} — ABNORMAL LOW (ref >59)
GFR calc non Af Amer: 47 mL/min/{1.73_m2} — ABNORMAL LOW (ref >59)
Globulin, Total: 2.8 g/dL (ref 1.5–4.5)
Glucose: 134 mg/dL — ABNORMAL HIGH (ref 65–99)
Potassium: 5 mmol/L (ref 3.5–5.2)
Sodium: 139 mmol/L (ref 134–144)
Total Protein: 7 g/dL (ref 6.0–8.5)

## 2018-02-06 ENCOUNTER — Telehealth: Payer: Self-pay | Admitting: Family Medicine

## 2018-02-06 MED ORDER — METOPROLOL SUCCINATE ER 50 MG PO TB24: 50.0000 mg | ORAL_TABLET | Freq: Every day | ORAL | 0 refills | Status: DC

## 2018-02-06 NOTE — Telephone Encounter (Signed)
Copied from [place]. Topic: Quick Communication - Rx Refill/Question >> Feb 06, 2018  1:51 PM Scherrie Gerlach wrote: Medication: metoprolol succinate (TOPROL-XL) 50 MG 24 hr tablet Has the patient contacted their pharmacy? [facility] in their computer that we sent back in Jan.  Even though the pt's bottle says 1 refill, [facility]. Pt would like to know if we can call pharmacy and advise ok to refill   [id number] [place], [place]. [phone] (Phone) [phone] (Fax)

## 2018-02-19 ENCOUNTER — Ambulatory Visit (INDEPENDENT_AMBULATORY_CARE_PROVIDER_SITE_OTHER): Payer: Medicare Other

## 2018-02-19 ENCOUNTER — Ambulatory Visit (INDEPENDENT_AMBULATORY_CARE_PROVIDER_SITE_OTHER): Payer: Medicare Other | Admitting: Physician Assistant

## 2018-02-19 ENCOUNTER — Telehealth: Payer: Self-pay

## 2018-02-19 ENCOUNTER — Encounter: Payer: Self-pay | Admitting: Physician Assistant

## 2018-02-19 VITALS — BP 180/85 | HR 83 | Temp 99.0°F | Resp 18 | Ht 64.0 in | Wt 170.2 lb

## 2018-02-19 DIAGNOSIS — R0689 Other abnormalities of breathing: Secondary | ICD-10-CM | POA: Diagnosis not present

## 2018-02-19 DIAGNOSIS — R8290 Unspecified abnormal findings in urine: Secondary | ICD-10-CM | POA: Diagnosis not present

## 2018-02-19 DIAGNOSIS — R509 Fever, unspecified: Secondary | ICD-10-CM | POA: Diagnosis not present

## 2018-02-19 DIAGNOSIS — J700 Acute pulmonary manifestations due to radiation: Secondary | ICD-10-CM

## 2018-02-19 DIAGNOSIS — G620 Drug-induced polyneuropathy: Secondary | ICD-10-CM

## 2018-02-19 DIAGNOSIS — T451X5A Adverse effect of antineoplastic and immunosuppressive drugs, initial encounter: Secondary | ICD-10-CM

## 2018-02-19 DIAGNOSIS — J111 Influenza due to unidentified influenza virus with other respiratory manifestations: Secondary | ICD-10-CM | POA: Diagnosis not present

## 2018-02-19 DIAGNOSIS — C3491 Malignant neoplasm of unspecified part of right bronchus or lung: Secondary | ICD-10-CM

## 2018-02-19 DIAGNOSIS — J449 Chronic obstructive pulmonary disease, unspecified: Secondary | ICD-10-CM

## 2018-02-19 LAB — POCT URINALYSIS DIP (MANUAL ENTRY)
Bilirubin, UA: NEGATIVE
Blood, UA: NEGATIVE
Glucose, UA: NEGATIVE mg/dL
Ketones, POC UA: NEGATIVE mg/dL
Leukocytes, UA: NEGATIVE
Nitrite, UA: NEGATIVE
Protein Ur, POC: NEGATIVE mg/dL
Spec Grav, UA: <=1.005 — AB (ref 1.010–1.025)
Urobilinogen, UA: 0.2 E.U./dL
pH, UA: 5.5 (ref 5.0–8.0)

## 2018-02-19 LAB — POCT INFLUENZA A/B
Influenza A, POC: POSITIVE — AB
Influenza B, POC: NEGATIVE

## 2018-02-19 LAB — POC MICROSCOPIC URINALYSIS (UMFC): Mucus: ABSENT

## 2018-02-19 MED ORDER — PREDNISONE 20 MG PO TABS: ORAL_TABLET | ORAL | 0 refills | Status: DC

## 2018-02-19 MED ORDER — AZITHROMYCIN 250 MG PO TABS
ORAL_TABLET | ORAL | 0 refills | Status: DC
Start: 2018-02-19 — End: 2018-02-21

## 2018-02-19 NOTE — Patient Instructions (Addendum)
  Your work-up is unrevealing.  I would like to see you back on Friday for recheck.  Given your abnormal breath sounds I am prescribing you an antibiotic along with prednisone.  Please start these medications today.  If at any point you begin to have chest pain, worsening shortness of breath, presyncope as if you are going to black out, please go directly to the emergency room.   IF you received an x-ray today, you will receive an invoice from [facility]. Please contact [facility] at [phone] with questions or concerns regarding your invoice.   IF you received labwork today, you will receive an invoice from [place]. Please contact [facility] at [phone] with questions or concerns regarding your invoice.   Our billing staff will not be able to assist you with questions regarding bills from these companies.  You will be contacted with the lab results as soon as they are available. The fastest way to get your results is to activate your My Chart account. Instructions are located on the last page of this paperwork. If you have not heard from [place] regarding the results in 2 weeks, please contact this office.

## 2018-02-19 NOTE — Telephone Encounter (Signed)
Pt would like to be switched from [place] to [facility] for home oxygen. She states that she wanted to get the Audiological scientist and [facility] offers this. She is currently on 2L continuous.

## 2018-02-19 NOTE — Progress Notes (Addendum)
02/19/2018 10:02 AM   DOB: [birth year] / MRN: [id number]  SUBJECTIVE:  Tammy Ball is a 77 y.o. female with a history of COPD presenting for fever and malodorous urine.  Symptoms present for 3 days and worsening.  She associates cough.  Patient hospitalized roughly this time last month for positive influenza A with deleterious vital signs.  Patient was treated with prednisone and Tamiflu at that time.  Tells me she felt she should have stayed longer.  She has started wearing oxygen since that visit.  She is allergic to bee venom; amlodipine; levofloxacin; fosamax [alendronate sodium]; and hctz [hydrochlorothiazide].   She  has a past medical history of Anemia, Anxiety, Asthma, Chemotherapy-induced neuropathy ([place]) (11/01/2015), Claustrophobia, COPD (chronic obstructive pulmonary disease) ([place]), Depression, Encounter for antineoplastic chemotherapy (02/09/2016), Glaucoma, Headache, Heart murmur, History of echocardiogram, History of nuclear stress test, History of radiation therapy (10/30/16-11/06/16), Hyperlipidemia, Hypertension, Insomnia (05/16/2016), Non-small cell carcinoma of right lung, stage 2 ([place]) (10/03/2015), Radiation (02/29/16-04/10/16), and Renal cell carcinoma ([place]).    She  reports that she quit smoking about 6 years ago. Her smoking use included cigarettes. She has a 25.00 pack-year smoking history. She has never used smokeless tobacco. She reports that she does not drink alcohol or use drugs. She  reports that she does not currently engage in sexual activity. The patient  has a past surgical history that includes Nephrectomy; kidney cancer; Tubal ligation; Spine surgery; Eye surgery; Video bronchoscopy (Bilateral, 09/20/2015); Back surgery; Video bronchoscopy (N/A, 01/18/2016); Video assisted thoracoscopy (vats)/wedge resection (Right, 01/18/2016); ir generic historical (01/16/2017); ir generic historical (01/31/2017); ir generic historical (01/31/2017); and ir generic historical (01/31/2017).   Her family history includes Birth defects in her sister; Breast cancer in her sister; CAD (age of onset: 4) in her mother; Cancer in her maternal grandmother and mother; Glaucoma in her daughter; Heart attack (age of onset: 26) in her daughter; Heart disease in her sister; Hypertension in her mother; Obesity in her brother.  Review of Systems  Constitutional: Positive for malaise/fatigue. Negative for chills, diaphoresis and fever.  Eyes: Negative.   Respiratory: Negative for cough, hemoptysis, sputum production, shortness of breath and wheezing.   Cardiovascular: Negative for chest pain, orthopnea and leg swelling.  Gastrointestinal: Negative for abdominal pain, blood in stool, constipation, diarrhea, heartburn, melena, nausea and vomiting.  Genitourinary: Positive for dysuria, frequency and urgency. Negative for flank pain and hematuria.  Musculoskeletal: Negative for myalgias and neck pain.  Skin: Negative for rash.  Neurological: Negative for dizziness, sensory change, speech change, focal weakness and headaches.    The problem list and medications were reviewed and updated by myself where necessary and exist elsewhere in the encounter.   OBJECTIVE:  BP (!) 180/85   Pulse 83   Temp 99 F (37.2 C) (Oral)   Resp 18   Ht 5\' 4"  (1.626 m)   Wt 170 lb 3.2 oz (77.2 kg)   SpO2 95%   BMI 29.21 kg/m   Physical Exam  Constitutional: She is active.  Non-toxic appearance.  Nontoxic.  Seeing wearing nasal cannula with O2 tank in tow.   Cardiovascular: Normal rate, regular rhythm, S1 normal, S2 normal, normal heart sounds and intact distal pulses. Exam reveals no gallop, no friction rub and no decreased pulses.  No murmur heard. BP 164/78 by ausculation by me  Pulmonary/Chest: Effort normal. No stridor. No tachypnea. No respiratory distress. She has no wheezes. She has no rales.      Abdominal: Soft. Bowel sounds  are normal. She exhibits no distension and no mass. There is no  tenderness. There is no rebound and no guarding.  Musculoskeletal: Normal range of motion. She exhibits no edema, tenderness or deformity.  Neurological: She is alert.  Skin: Skin is warm and dry. She is not diaphoretic. No pallor.    Estimated Creatinine Clearance: 43.5 mL/min (A) (by C-G formula based on SCr of 1.14 mg/dL (H)).   Results for orders placed or performed in visit on 02/19/18 (from the past 72 hour(s))  POCT urinalysis dipstick     Status: Abnormal   Collection Time: 02/19/18  9:04 AM  Result Value Ref Range   Color, UA yellow yellow   Clarity, UA clear clear   Glucose, UA negative negative mg/dL   Bilirubin, UA negative negative   Ketones, POC UA negative negative mg/dL   Spec Grav, UA <=1.005 (A) 1.010 - 1.025   Blood, UA negative negative   pH, UA 5.5 5.0 - 8.0   Protein Ur, POC negative negative mg/dL   Urobilinogen, UA 0.2 0.2 or 1.0 E.U./dL   Nitrite, UA Negative Negative   Leukocytes, UA Negative Negative  POCT Microscopic Urinalysis ([facility])     Status: Abnormal   Collection Time: 02/19/18  9:19 AM  Result Value Ref Range   WBC,UR,HPF,POC None None WBC/hpf   RBC,UR,HPF,POC None None RBC/hpf   Bacteria None None, Too numerous to count   Mucus Absent Absent   Epithelial Cells, UR Per Microscopy Few (A) None, Too numerous to count cells/hpf  POCT Influenza A/B     Status: Abnormal   Collection Time: 02/19/18  9:21 AM  Result Value Ref Range   Influenza A, POC Positive (A) Negative   Influenza B, POC Negative Negative   Wt Readings from Last 3 Encounters:  02/19/18 170 lb 3.2 oz (77.2 kg)  01/29/18 171 lb (77.6 kg)  01/23/18 174 lb 1.6 oz (79 kg)   Temp Readings from Last 3 Encounters:  02/19/18 99 F (37.2 C) (Oral)  01/25/18 98.2 F (36.8 C) (Oral)  01/23/18 99.7 F (37.6 C) (Oral)   BP Readings from Last 3 Encounters:  02/19/18 (!) 180/85  01/29/18 (!) 150/100  01/25/18 (!) 105/58   Pulse Readings from Last 3 Encounters:  02/19/18 83    01/29/18 84  01/25/18 74     EKG reveals sinus rhythm without evidence of acute ischemia and infarction.  No acute change to previous tracing.  Dg Chest 2 View  Result Date: 02/19/2018 CLINICAL DATA:  Previous flu. Abnormal breath sounds in the right lower lobe. EXAM: CHEST - 2 VIEW COMPARISON:  Chest x-rays dated 01/23/2018 and 12/08/2016. FINDINGS: Heart size and mediastinal contours are stable. Aortic atherosclerosis. Lungs are hyperexpanded. Chronic bronchitic changes noted centrally. Coarse lung markings at the lung bases suggests associated chronic interstitial lung disease/fibrosis. No new lung findings. No confluent opacity to suggest a developing pneumonia. No pleural effusion or pneumothorax seen. No acute or suspicious osseous finding. IMPRESSION: 1. No active cardiopulmonary disease. No evidence of pneumonia or pulmonary edema. 2. COPD with associated chronic bronchitic changes and chronic interstitial lung disease/fibrosis. 3. Aortic atherosclerosis. Electronically Signed   By: Franki Cabot M.D.   On: 02/19/2018 09:48     ASSESSMENT AND PLAN:  Yaniyah was seen today for urinary frequency and fever.  Diagnoses and all orders for this visit:  Adventitious breath sounds: Rads ckear.  Patient with recent flu and hospitalized with positive flu A here with 6 days of symptoms.  Her EKG is unchanged today and her chest x-ray is within normal limits given history of COPD.  Given her adventitious breath sounds, which likely represent a COPD flare,  have advised that she start another prednisone taper along with azithromycin.   ED precautions discussed advised to return for recheck on Friday.  However I think this is old disease. -     DG Chest 2 View; Future -     EKG 12-Lead -     predniSONE (DELTASONE) 20 MG tablet; Take 3 in the morning for 3 days, then 2 in the morning for 3 days, and then 1 in the morning for 3 days. -     azithromycin (ZITHROMAX) 250 MG tablet; Take 2 tabs PO x 1  dose, then 1 tab PO QD x 4 days -     Care order/instruction:  Malodorous urine -     Cancel: POCT urine pregnancy -     Cancel: POCT Microscopic Urinalysis ([facility]) -     POCT urinalysis dipstick her flu is positive -     POCT Microscopic Urinalysis ([facility]) -     Urine Culture  Febrile illness, acute -     POCT Influenza A/B    The patient is advised to call or return to clinic if she does not see an improvement in symptoms, or to seek the care of the closest emergency department if she worsens with the above plan.   Philis Fendt, MHS, PA-C [facility] 02/19/2018 10:02 AM

## 2018-02-20 LAB — URINE CULTURE

## 2018-02-21 ENCOUNTER — Encounter: Payer: Self-pay | Admitting: Physician Assistant

## 2018-02-21 ENCOUNTER — Ambulatory Visit (INDEPENDENT_AMBULATORY_CARE_PROVIDER_SITE_OTHER): Payer: Medicare Other | Admitting: Physician Assistant

## 2018-02-21 ENCOUNTER — Other Ambulatory Visit: Payer: Self-pay

## 2018-02-21 VITALS — BP 138/88 | HR 88 | Temp 98.0°F | Resp 16 | Wt 170.0 lb

## 2018-02-21 DIAGNOSIS — J449 Chronic obstructive pulmonary disease, unspecified: Secondary | ICD-10-CM

## 2018-02-21 NOTE — Patient Instructions (Addendum)
Call PA Clark at [phone] for any problem with oxygen concentrator.    IF you received an x-ray today, you will receive an invoice from [facility]. Please contact [facility] at [phone] with questions or concerns regarding your invoice.   IF you received labwork today, you will receive an invoice from [place]. Please contact [facility] at [phone] with questions or concerns regarding your invoice.   Our billing staff will not be able to assist you with questions regarding bills from these companies.  You will be contacted with the lab results as soon as they are available. The fastest way to get your results is to activate your My Chart account. Instructions are located on the last page of this paperwork. If you have not heard from [place] regarding the results in 2 weeks, please contact this office.

## 2018-02-21 NOTE — Progress Notes (Addendum)
02/21/2018 11:14 AM   DOB: [birth year] / MRN: [id number]  SUBJECTIVE:  Tammy Ball is a 77 y.o. female presenting for acute illness  follow-up.  Patient has a history of COPD Gold class II.  Patient was complaining of fever, cough patient reports that she is feeling much better today.  Dysuria, fatigue starting roughly 8 days ago now.  Previous workup was reassuring and patient was discharged with p.o. antibiotic along with prednisone given history of COPD.  She was requesting that I order an oxygen concentrator [facility] for her today.  She was started on O2 2 L after discharge from the hospital.  She has an O2 tank with her today that is set at 2 L however states that this is very cumbersome and she would like to try an oxygen concentrator.  She is requesting that supplies be sent to [place].  She is allergic to bee venom; amlodipine; levofloxacin; fosamax [alendronate sodium]; and hctz [hydrochlorothiazide].   She  has a past medical history of Anemia, Anxiety, Asthma, Chemotherapy-induced neuropathy ([place]) (11/01/2015), Claustrophobia, COPD (chronic obstructive pulmonary disease) ([place]), Depression, Encounter for antineoplastic chemotherapy (02/09/2016), Glaucoma, Headache, Heart murmur, History of echocardiogram, History of nuclear stress test, History of radiation therapy (10/30/16-11/06/16), Hyperlipidemia, Hypertension, Insomnia (05/16/2016), Non-small cell carcinoma of right lung, stage 2 ([place]) (10/03/2015), Radiation (02/29/16-04/10/16), and Renal cell carcinoma ([place]).    She  reports that she quit smoking about 6 years ago. Her smoking use included cigarettes. She has a 25.00 pack-year smoking history. She has never used smokeless tobacco. She reports that she does not drink alcohol or use drugs. She  reports that she does not currently engage in sexual activity. The patient  has a past surgical history that includes Nephrectomy; kidney cancer; Tubal ligation; Spine surgery; Eye surgery; Video  bronchoscopy (Bilateral, 09/20/2015); Back surgery; Video bronchoscopy (N/A, 01/18/2016); Video assisted thoracoscopy (vats)/wedge resection (Right, 01/18/2016); ir generic historical (01/16/2017); ir generic historical (01/31/2017); ir generic historical (01/31/2017); and ir generic historical (01/31/2017).  Her family history includes Birth defects in her sister; Breast cancer in her sister; CAD (age of onset: 33) in her mother; Cancer in her maternal grandmother and mother; Glaucoma in her daughter; Heart attack (age of onset: 70) in her daughter; Heart disease in her sister; Hypertension in her mother; Obesity in her brother.  Review of Systems  Constitutional: Negative for chills, diaphoresis and fever.  Respiratory: Positive for shortness of breath (Chronic, improved since last visit.). Negative for cough, hemoptysis, sputum production and wheezing.   Cardiovascular: Negative for chest pain, orthopnea and leg swelling.  Gastrointestinal: Negative for abdominal pain, blood in stool, constipation, diarrhea, heartburn, melena, nausea and vomiting.  Genitourinary: Negative for flank pain.  Skin: Negative for rash.  Neurological: Negative for dizziness.    The problem list and medications were reviewed and updated by myself where necessary and exist elsewhere in the encounter.   OBJECTIVE:  BP 138/88   Pulse 88   Temp 98 F (36.7 C) (Oral)   Resp 16   Wt 170 lb (77.1 kg)   SpO2 95%   BMI 29.18 kg/m   Wt Readings from Last 3 Encounters:  02/21/18 170 lb (77.1 kg)  02/19/18 170 lb 3.2 oz (77.2 kg)  01/29/18 171 lb (77.6 kg)   Temp Readings from Last 3 Encounters:  02/21/18 98 F (36.7 C) (Oral)  02/19/18 99 F (37.2 C) (Oral)  01/25/18 98.2 F (36.8 C) (Oral)   BP Readings from Last 3 Encounters:  02/21/18 138/88  02/19/18 (!) 180/85  01/29/18 (!) 150/100   Pulse Readings from Last 3 Encounters:  02/21/18 88  02/19/18 83  01/29/18 84     Physical Exam  Constitutional: She is  oriented to person, place, and time. She appears well-developed.  Eyes: Pupils are equal, round, and reactive to light. EOM are normal.  Cardiovascular: Normal rate.  Pulmonary/Chest: Effort normal. No stridor. No respiratory distress. She has no wheezes. She has no rales.  6-minute walk reveals 91% saturation on room air at rest.  Patient desaturates to 86% 2 minutes into the 6-minute walk.  Patient saturation did not improve with rest without O2 for roughly 5 minutes.  O2 was restarted at 2 L and patient resaturated to 97% and remained at 97% with ambulation.   Abdominal: She exhibits no distension.  Musculoskeletal: Normal range of motion.  Neurological: She is alert and oriented to person, place, and time. No cranial nerve deficit.  Skin: Skin is warm and dry. No rash noted. She is not diaphoretic. No pallor.  Psychiatric: She has a normal mood and affect.  Vitals reviewed.   Results for orders placed or performed in visit on 02/19/18 (from the past 72 hour(s))  POCT urinalysis dipstick     Status: Abnormal   Collection Time: 02/19/18  9:04 AM  Result Value Ref Range   Color, UA yellow yellow   Clarity, UA clear clear   Glucose, UA negative negative mg/dL   Bilirubin, UA negative negative   Ketones, POC UA negative negative mg/dL   Spec Grav, UA <=1.005 (A) 1.010 - 1.025   Blood, UA negative negative   pH, UA 5.5 5.0 - 8.0   Protein Ur, POC negative negative mg/dL   Urobilinogen, UA 0.2 0.2 or 1.0 E.U./dL   Nitrite, UA Negative Negative   Leukocytes, UA Negative Negative  POCT Microscopic Urinalysis ([facility])     Status: Abnormal   Collection Time: 02/19/18  9:19 AM  Result Value Ref Range   WBC,UR,HPF,POC None None WBC/hpf   RBC,UR,HPF,POC None None RBC/hpf   Bacteria None None, Too numerous to count   Mucus Absent Absent   Epithelial Cells, UR Per Microscopy Few (A) None, Too numerous to count cells/hpf  POCT Influenza A/B     Status: Abnormal   Collection Time: 02/19/18   9:21 AM  Result Value Ref Range   Influenza A, POC Positive (A) Negative   Influenza B, POC Negative Negative  Urine Culture     Status: None   Collection Time: 02/19/18 10:33 AM  Result Value Ref Range   Urine Culture, Routine Final report    Organism ID, Bacteria Comment     Comment: Culture shows less than 10,000 colony forming units of bacteria per milliliter of urine. This colony count is not generally considered to be clinically significant.     No results found.  ASSESSMENT AND PLAN:  Colena was seen today for adventitious breath sounds.  Diagnoses and all orders for this visit:  COPD GOLD II: Patient wanting a oxygen concentrator.  I have sent this prescription to [facility].  Please see 6-minute walk as detailed in exam.  I have asked that she follow-up with her primary Dr. Tamala Julian in about 1 month.  Sooner if needed.  Patient will make that appointment today. -     PR OXYGEN CONCENTRATOR -     For home use only DME oxygen    The patient is advised to call or return to clinic  if she does not see an improvement in symptoms, or to seek the care of the closest emergency department if she worsens with the above plan.   Philis Fendt, MHS, PA-C [facility] 02/21/2018 11:14 AM

## 2018-02-24 ENCOUNTER — Encounter: Payer: Self-pay | Admitting: Physician Assistant

## 2018-02-24 NOTE — Addendum Note (Signed)
Addended by: Wardell Honour on: 02/24/2018 12:22 PM   Modules accepted: Orders

## 2018-02-24 NOTE — Telephone Encounter (Signed)
Agreeable.Ordered placed yet printed.  Please fax order to [facility].

## 2018-02-24 NOTE — Telephone Encounter (Signed)
Order faxed 4/15

## 2018-02-25 ENCOUNTER — Ambulatory Visit (INDEPENDENT_AMBULATORY_CARE_PROVIDER_SITE_OTHER): Payer: Medicare Other | Admitting: Urgent Care

## 2018-02-25 ENCOUNTER — Encounter: Payer: Self-pay | Admitting: Urgent Care

## 2018-02-25 VITALS — BP 169/103 | HR 92 | Temp 98.8°F | Resp 18 | Ht 64.0 in | Wt 171.4 lb

## 2018-02-25 DIAGNOSIS — R609 Edema, unspecified: Secondary | ICD-10-CM

## 2018-02-25 DIAGNOSIS — R030 Elevated blood-pressure reading, without diagnosis of hypertension: Secondary | ICD-10-CM

## 2018-02-25 DIAGNOSIS — Q600 Renal agenesis, unilateral: Secondary | ICD-10-CM | POA: Diagnosis not present

## 2018-02-25 DIAGNOSIS — J449 Chronic obstructive pulmonary disease, unspecified: Secondary | ICD-10-CM

## 2018-02-25 DIAGNOSIS — I10 Essential (primary) hypertension: Secondary | ICD-10-CM

## 2018-02-25 DIAGNOSIS — IMO0002 Reserved for concepts with insufficient information to code with codable children: Secondary | ICD-10-CM

## 2018-02-25 LAB — BASIC METABOLIC PANEL
BUN/Creatinine Ratio: 19 (ref 12–28)
BUN: 23 mg/dL (ref 8–27)
CO2: 29 mmol/L (ref 20–29)
Calcium: 10.1 mg/dL (ref 8.7–10.3)
Chloride: 98 mmol/L (ref 96–106)
Creatinine, Ser: 1.19 mg/dL — ABNORMAL HIGH (ref 0.57–1.00)
GFR calc Af Amer: 52 mL/min/{1.73_m2} — ABNORMAL LOW (ref >59)
GFR calc non Af Amer: 45 mL/min/{1.73_m2} — ABNORMAL LOW (ref >59)
Glucose: 97 mg/dL (ref 65–99)
Potassium: 4.5 mmol/L (ref 3.5–5.2)
Sodium: 143 mmol/L (ref 134–144)

## 2018-02-25 MED ORDER — FUROSEMIDE 20 MG PO TABS: 20.0000 mg | ORAL_TABLET | Freq: Every day | ORAL | 0 refills | Status: DC

## 2018-02-25 NOTE — Telephone Encounter (Signed)
Phone call to patient to follow up on MyChart message.   Patient states she doesn't feel great. Regarding swelling, she states, "My feet and ankles have gone down some". Confirms both feet are swelling.    BP has gone down this morning, 160/88 taken 30 minutes ago.   Denies chest pain, shortness of breath. Had headache yesterday, none today. Blurred vision onset yesterday. No palpitations. HR is 72.   Patient states she has been taking steroids since March 14th- didn't take oral steroids (Prednisone) yet this morning. Only has one kidney.  BP Readings from Last 3 Encounters:  02/21/18 138/88  02/19/18 (!) 180/85  01/29/18 (!) 150/100   Due to elevated blood pressure at home, persistent visual changes, and history of having only one kidney, recommended patient come in today for office visit. She is agreeable. Appointment scheduled for 02/25/18 with Bess Harvest, PA at 11:40.

## 2018-02-25 NOTE — Telephone Encounter (Signed)
I'm glad she is getting seen. Philis Fendt, MS, PA-C 2:36 PM, 02/25/2018

## 2018-02-25 NOTE — Patient Instructions (Addendum)
Edema Edema is an abnormal buildup of fluids in your bodytissues. Edema is somewhatdependent on gravity to pull the fluid to the lowest place in your body. That makes the condition more common in the legs and thighs (lower extremities). Painless swelling of the feet and ankles is common and becomes more likely as you get older. It is also common in looser tissues, like around your eyes. When the affected area is squeezed, the fluid may move out of that spot and leave a dent for a few moments. This dent is called pitting. What are the causes? There are many possible causes of edema. Eating too much salt and being on your feet or sitting for a long time can cause edema in your legs and ankles. Hot weather may make edema worse. Common medical causes of edema include:  Heart failure.  Liver disease.  Kidney disease.  Weak blood vessels in your legs.  Cancer.  An injury.  Pregnancy.  Some medications.  Obesity.  What are the signs or symptoms? Edema is usually painless.Your skin may look swollen or shiny. How is this diagnosed? Your health care provider may be able to diagnose edema by asking about your medical history and doing a physical exam. You may need to have tests such as X-rays, an electrocardiogram, or blood tests to check for medical conditions that may cause edema. How is this treated? Edema treatment depends on the cause. If you have heart, liver, or kidney disease, you need the treatment appropriate for these conditions. General treatment may include:  Elevation of the affected body part above the level of your heart.  Compression of the affected body part. Pressure from elastic bandages or support stockings squeezes the tissues and forces fluid back into the blood vessels. This keeps fluid from entering the tissues.  Restriction of fluid and salt intake.  Use of a water pill (diuretic). These medications are appropriate only for some types of edema. They pull fluid  out of your body and make you urinate more often. This gets rid of fluid and reduces swelling, but diuretics can have side effects. Only use diuretics as directed by your health care provider.  Follow these instructions at home:  Keep the affected body part above the level of your heart when you are lying down.  Do not sit still or stand for prolonged periods.  Do not put anything directly under your knees when lying down.  Do not wear constricting clothing or garters on your upper legs.  Exercise your legs to work the fluid back into your blood vessels. This may help the swelling go down.  Wear elastic bandages or support stockings to reduce ankle swelling as directed by your health care provider.  Eat a low-salt diet to reduce fluid if your health care provider recommends it.  Only take medicines as directed by your health care provider. Contact a health care provider if:  Your edema is not responding to treatment.  You have heart, liver, or kidney disease and notice symptoms of edema.  You have edema in your legs that does not improve after elevating them.  You have sudden and unexplained weight gain. Get help right away if:  You develop shortness of breath or chest pain.  You cannot breathe when you lie down.  You develop pain, redness, or warmth in the swollen areas.  You have heart, liver, or kidney disease and suddenly get edema.  You have a fever and your symptoms suddenly get worse. This information is   not intended to replace advice given to you by your health care provider. Make sure you discuss any questions you have with your health care provider. Document Released: 10/29/2005 Document Revised: 04/05/2016 Document Reviewed: 08/21/2013 Elsevier Interactive Patient Education  2017 Else[employer] Inc.     Hypertension Hypertension is another name for high blood pressure. High blood pressure forces your heart to work harder to pump blood. This can cause problems over  time. There are two numbers in a blood pressure reading. There is a top number (systolic) over a bottom number (diastolic). It is best to have a blood pressure below 120/80. Healthy choices can help lower your blood pressure. You may need medicine to help lower your blood pressure if:  Your blood pressure cannot be lowered with healthy choices.  Your blood pressure is higher than 130/80.  Follow these instructions at home: Eating and drinking  If directed, follow the DASH eating plan. This diet includes: ? Filling half of your plate at each meal with fruits and vegetables. ? Filling one quarter of your plate at each meal with whole grains. Whole grains include whole wheat pasta, brown rice, and whole grain bread. ? Eating or drinking low-fat dairy products, such as skim milk or low-fat yogurt. ? Filling one quarter of your plate at each meal with low-fat (lean) proteins. Low-fat proteins include fish, skinless chicken, eggs, beans, and tofu. ? Avoiding fatty meat, cured and processed meat, or chicken with skin. ? Avoiding premade or processed food.  Eat less than 1,500 mg of salt (sodium) a day.  Limit alcohol use to no more than 1 drink a day for nonpregnant women and 2 drinks a day for men. One drink equals 12 oz of beer, 5 oz of wine, or 1 oz of hard liquor. Lifestyle  Work with your doctor to stay at a healthy weight or to lose weight. Ask your doctor what the best weight is for you.  Get at least 30 minutes of exercise that causes your heart to beat faster (aerobic exercise) most days of the week. This may include walking, swimming, or biking.  Get at least 30 minutes of exercise that strengthens your muscles (resistance exercise) at least 3 days a week. This may include lifting weights or pilates.  Do not use any products that contain nicotine or tobacco. This includes cigarettes and e-cigarettes. If you need help quitting, ask your doctor.  Check your blood pressure at home as  told by your doctor.  Keep all follow-up visits as told by your doctor. This is important. Medicines  Take over-the-counter and prescription medicines only as told by your doctor. Follow directions carefully.  Do not skip doses of blood pressure medicine. The medicine does not work as well if you skip doses. Skipping doses also puts you at risk for problems.  Ask your doctor about side effects or reactions to medicines that you should watch for. Contact a doctor if:  You think you are having a reaction to the medicine you are taking.  You have headaches that keep coming back (recurring).  You feel dizzy.  You have swelling in your ankles.  You have trouble with your vision. Get help right away if:  You get a very bad headache.  You start to feel confused.  You feel weak or numb.  You feel faint.  You get very bad pain in your: ? Chest. ? Belly (abdomen).  You throw up (vomit) more than once.  You have trouble breathing. Summary  Hypertension is another name for high blood pressure.  Making healthy choices can help lower blood pressure. If your blood pressure cannot be controlled with healthy choices, you may need to take medicine. This information is not intended to replace advice given to you by your health care provider. Make sure you discuss any questions you have with your health care provider. Document Released: 04/16/2008 Document Revised: 09/26/2016 Document Reviewed: 09/26/2016 Elsevier Interactive Patient Education  2018 [employer].     IF you received an x-ray today, you will receive an invoice from [facility]. Please contact [facility] at [phone] with questions or concerns regarding your invoice.   IF you received labwork today, you will receive an invoice from [facility]. Please contact [facility] at [phone] with questions or concerns regarding your invoice.   Our billing staff will not be able to assist you with questions  regarding bills from these companies.  You will be contacted with the lab results as soon as they are available. The fastest way to get your results is to activate your My Chart account. Instructions are located on the last page of this paperwork. If you have not heard from [place] regarding the results in 2 weeks, please contact this office.

## 2018-02-25 NOTE — Progress Notes (Signed)
    MRN: [id number] DOB: 02/25/1941  Subjective:   Tammy Ball is a 77 y.o. female presenting for elevated blood pressure, lower leg swelling.  Patient has been hospitalized recently in March 2019 for respiratory failure, influenza, shingles.  She has had consistent follow-up here at her clinic since then.  Today, she reports that her blood pressure has been elevated from hospitalization and having received fluids, states that she has had persistent edema in her hands and her legs since then.  She denies fever, chest pain, shortness of breath, belly pain.  Has a history of a solitary kidney.  She has a nephrologist and per patient reports that she does not have CKD.  Kelcie has a current medication list which includes the following prescription(s): acetaminophen, aspirin ec, atorvastatin, calcium carbonate, cholecalciferol, fish oil-omega-3 fatty acids, fluticasone-salmeterol, metoprolol succinate, tizanidine, tramadol, and trazodone. Also is allergic to bee venom; amlodipine; levofloxacin; fosamax [alendronate sodium]; and hctz [hydrochlorothiazide].  Breklyn  has a past medical history of Anemia, Anxiety, Asthma, Chemotherapy-induced neuropathy ([place]) (11/01/2015), Claustrophobia, COPD (chronic obstructive pulmonary disease) ([place]), Depression, Encounter for antineoplastic chemotherapy (02/09/2016), Glaucoma, Headache, Heart murmur, History of echocardiogram, History of nuclear stress test, History of radiation therapy (10/30/16-11/06/16), Hyperlipidemia, Hypertension, Insomnia (05/16/2016), Non-small cell carcinoma of right lung, stage 2 ([place]) (10/03/2015), Radiation (02/29/16-04/10/16), and Renal cell carcinoma ([place]). Also  has a past surgical history that includes Nephrectomy; kidney cancer; Tubal ligation; Spine surgery; Eye surgery; Video bronchoscopy (Bilateral, 09/20/2015); Back surgery; Video bronchoscopy (N/A, 01/18/2016); Video assisted thoracoscopy (vats)/wedge resection (Right, 01/18/2016); ir generic  historical (01/16/2017); ir generic historical (01/31/2017); ir generic historical (01/31/2017); and ir generic historical (01/31/2017).  Objective:   Vitals: BP (!) 169/103   Pulse 92   Temp 98.8 F (37.1 C) (Oral)   Resp 18   Ht 5\' 4"  (1.626 m)   Wt 171 lb 6.4 oz (77.7 kg)   SpO2 97%   BMI 29.42 kg/m   Physical Exam  Constitutional: She is oriented to person, place, and time. She appears well-developed and well-nourished.  Cardiovascular: Normal rate, regular rhythm and intact distal pulses. Exam reveals no gallop and no friction rub.  No murmur heard. Pulmonary/Chest: Effort normal. No respiratory distress. She has no wheezes. She has no rales.  Musculoskeletal: She exhibits edema (1+ pitting edema bilaterally up to calves).  Neurological: She is alert and oriented to person, place, and time.  Skin: Skin is warm and dry.  Psychiatric: She has a normal mood and affect.   Assessment and Plan :   Peripheral edema - Plan: Basic metabolic panel  Solitary kidney - Plan: Basic metabolic panel  Elevated blood pressure reading - Plan: Basic metabolic panel  Essential hypertension - Plan: Basic metabolic panel  COPD GOLD II   Pulse oximetry 97% and lung sounds are clear.  Patient is not on an antihypertensive right now.  She has previously tried hydrochlorothiazide and did not do very well with it.  We will try Lasix for 1 week. Counseled patient on potential for adverse effects with medications prescribed today, patient verbalized understanding. ER and return-to-clinic precautions discussed, patient verbalized understanding.   [name], PA-C [facility] [phone] 02/25/2018 11:25 AM

## 2018-02-28 ENCOUNTER — Encounter: Payer: Self-pay | Admitting: Urgent Care

## 2018-03-04 ENCOUNTER — Ambulatory Visit (INDEPENDENT_AMBULATORY_CARE_PROVIDER_SITE_OTHER): Payer: Medicare Other | Admitting: Urgent Care

## 2018-03-04 ENCOUNTER — Encounter: Payer: Self-pay | Admitting: Urgent Care

## 2018-03-04 VITALS — BP 134/80 | HR 81 | Temp 98.1°F | Resp 17 | Ht 64.0 in | Wt 170.0 lb

## 2018-03-04 DIAGNOSIS — R030 Elevated blood-pressure reading, without diagnosis of hypertension: Secondary | ICD-10-CM

## 2018-03-04 DIAGNOSIS — R609 Edema, unspecified: Secondary | ICD-10-CM

## 2018-03-04 DIAGNOSIS — I10 Essential (primary) hypertension: Secondary | ICD-10-CM

## 2018-03-04 DIAGNOSIS — R252 Cramp and spasm: Secondary | ICD-10-CM | POA: Diagnosis not present

## 2018-03-04 DIAGNOSIS — Q600 Renal agenesis, unilateral: Secondary | ICD-10-CM

## 2018-03-04 DIAGNOSIS — IMO0002 Reserved for concepts with insufficient information to code with codable children: Secondary | ICD-10-CM

## 2018-03-04 LAB — POTASSIUM: Potassium: 5.1 mmol/L (ref 3.5–5.2)

## 2018-03-04 NOTE — Progress Notes (Signed)
    MRN: [id number] DOB: August 13, 1941  Subjective:   Tammy Ball is a 77 y.o. female presenting for follow up on peripheral edema, elevated blood pressure.  Patient was provided with prescription for Lasix on February 25, 2018.  She took this for a few days but had to stop due to having to urinate very frequently and muscle cramps.  Patient states that she was trying to eat potassium rich foods to avoid hyperkalemia that can come with Lasix.  She reports that she feels a lot better.  Her blood pressure has been normal since starting Lasix and she no longer has her lower leg swelling.  Denies chest pain, belly pain, heart racing, palpitations.  Shaneece has a current medication list which includes the following prescription(s): acetaminophen, aspirin ec, atorvastatin, calcium carbonate, cholecalciferol, fish oil-omega-3 fatty acids, fluticasone-salmeterol, furosemide, metoprolol succinate, tizanidine, tramadol, and trazodone. Also is allergic to bee venom; amlodipine; levofloxacin; fosamax [alendronate sodium]; and hctz [hydrochlorothiazide].  Palma  has a past medical history of Anemia, Anxiety, Asthma, Chemotherapy-induced neuropathy ([place]) (11/01/2015), Claustrophobia, COPD (chronic obstructive pulmonary disease) ([place]), Depression, Encounter for antineoplastic chemotherapy (02/09/2016), Glaucoma, Headache, Heart murmur, History of echocardiogram, History of nuclear stress test, History of radiation therapy (10/30/16-11/06/16), Hyperlipidemia, Hypertension, Insomnia (05/16/2016), Non-small cell carcinoma of right lung, stage 2 ([place]) (10/03/2015), Radiation (02/29/16-04/10/16), and Renal cell carcinoma ([place]). Also  has a past surgical history that includes Nephrectomy; kidney cancer; Tubal ligation; Spine surgery; Eye surgery; Video bronchoscopy (Bilateral, 09/20/2015); Back surgery; Video bronchoscopy (N/A, 01/18/2016); Video assisted thoracoscopy (vats)/wedge resection (Right, 01/18/2016); ir generic historical  (01/16/2017); ir generic historical (01/31/2017); ir generic historical (01/31/2017); and ir generic historical (01/31/2017).  Objective:   Vitals: BP 134/80   Pulse 81   Temp 98.1 F (36.7 C) (Oral)   Resp 17   Ht 5\' 4"  (1.626 m)   Wt 170 lb (77.1 kg)   SpO2 98%   BMI 29.18 kg/m   BP Readings from Last 3 Encounters:  03/04/18 134/80  02/25/18 (!) 169/103  02/21/18 138/88    Physical Exam  Constitutional: She is oriented to person, place, and time. She appears well-developed and well-nourished.  Cardiovascular: Normal rate, regular rhythm and intact distal pulses. Exam reveals no gallop and no friction rub.  No murmur heard. Pulmonary/Chest: No respiratory distress. She has no wheezes. She has no rales.  Musculoskeletal: She exhibits no edema.  Neurological: She is alert and oriented to person, place, and time.  Skin: Skin is warm and dry.  Psychiatric: She has a normal mood and affect.   Assessment and Plan :   Peripheral edema - Plan: Potassium  Solitary kidney  Elevated blood pressure reading  Essential hypertension  Muscle cramps  Blood pressure and peripheral edema are improved.  Patient is no longer having cramps and is urinating normally since stopping Lasix.  Recommended patient eat potassium rich foods as she is not keen on taking potassium supplement.  Will check potassium level today.  Follow-up as needed.  [name], PA-C [facility] [phone] 03/04/2018 9:41 AM

## 2018-03-04 NOTE — Patient Instructions (Addendum)
   If instructed, eat more foods that contain a lot of potassium, such as: ? Nuts, such as peanuts and pistachios. ? Seeds, such as sunflower seeds and pumpkin seeds. ? Peas, lentils, and lima beans. ? Whole grain and bran cereals and breads. ? Fresh fruits and vegetables, such as apricots, avocado, bananas, cantaloupe, kiwi, oranges, tomatoes, asparagus, and potatoes. ? Orange juice. ? Tomato juice. ? Red meats. ? Yogurt.    IF you received an x-ray today, you will receive an invoice from [facility]. Please contact [facility] at [phone] with questions or concerns regarding your invoice.   IF you received labwork today, you will receive an invoice from [facility]. Please contact [facility] at [phone] with questions or concerns regarding your invoice.   Our billing staff will not be able to assist you with questions regarding bills from these companies.  You will be contacted with the lab results as soon as they are available. The fastest way to get your results is to activate your My Chart account. Instructions are located on the last page of this paperwork. If you have not heard from [place] regarding the results in 2 weeks, please contact this office.

## 2018-03-05 IMAGING — CR DG CHEST 1V PORT
1 series · 1 of 1 positions shown · non-contrast
Comparison: Portable exam 4636 hours compared to 01/16/2016

CLINICAL DATA: Post RIGHT thoracotomy, RIGHT lung resection, and
chest tube placement

EXAM:
PORTABLE CHEST 1 VIEW

[AP]
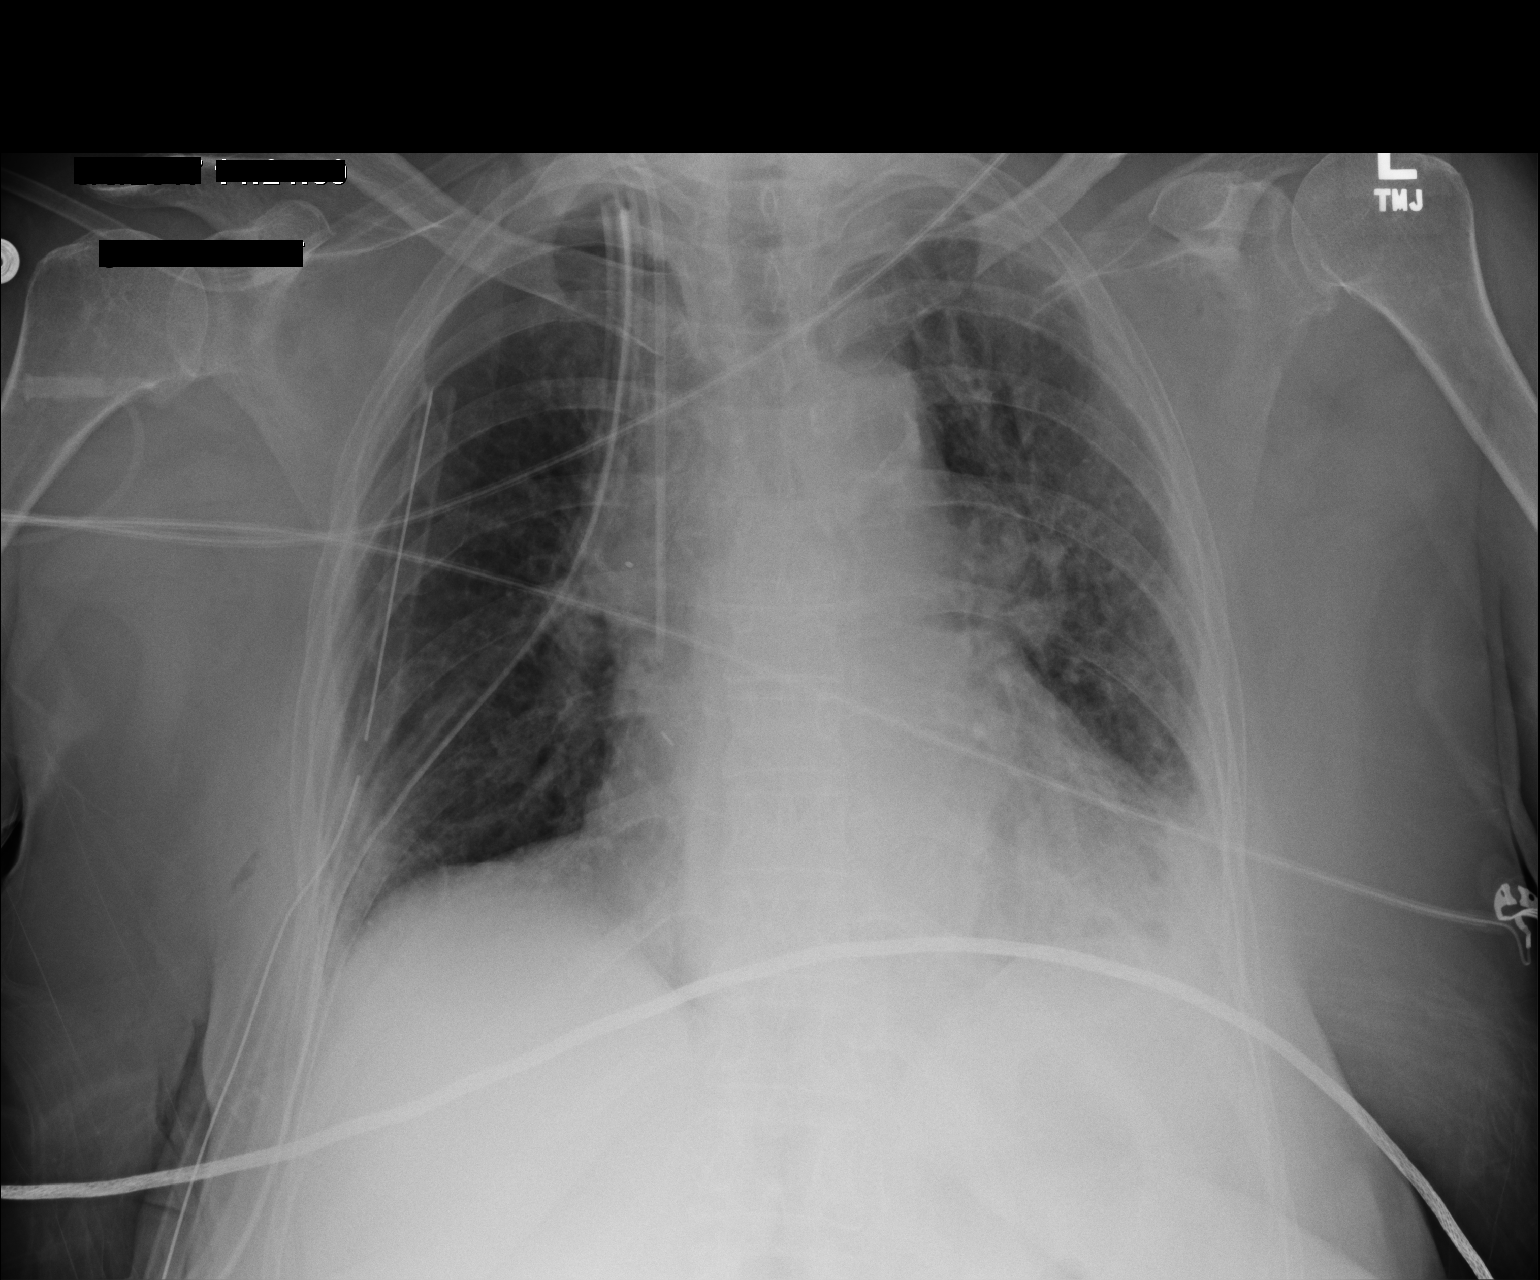

[1 of 1 positions shown; findings below may reference images not displayed]

FINDINGS: RIGHT jugular central venous catheter with tip projecting over SVC.

Pair of RIGHT thoracostomy tubes newly identified.

Enlargement of cardiac silhouette with pulmonary vascular
congestion.

Question mild LEFT perihilar infiltrate versus edema.

Minimal RIGHT basilar atelectasis without pleural effusion.

Small RIGHT apex pneumothorax identified.

LEFT basilar atelectasis and questionably small effusion present.

Atherosclerotic calcification aorta.

Prior cervical spine fusion.
IMPRESSION: Postoperative changes RIGHT hemi thorax with small RIGHT apex
pneumothorax despite presence of 2 thoracostomy tubes.

Enlargement of cardiac silhouette with pulmonary vascular congestion
and question minimal LEFT perihilar infiltrate versus edema.

## 2018-03-06 IMAGING — CR DG CHEST 1V PORT
1 series · 1 of 1 positions shown · non-contrast
Comparison: 01/18/2016.

CLINICAL DATA: Right lung surgery.  Chest tube.

EXAM:
PORTABLE CHEST 1 VIEW

[AP]
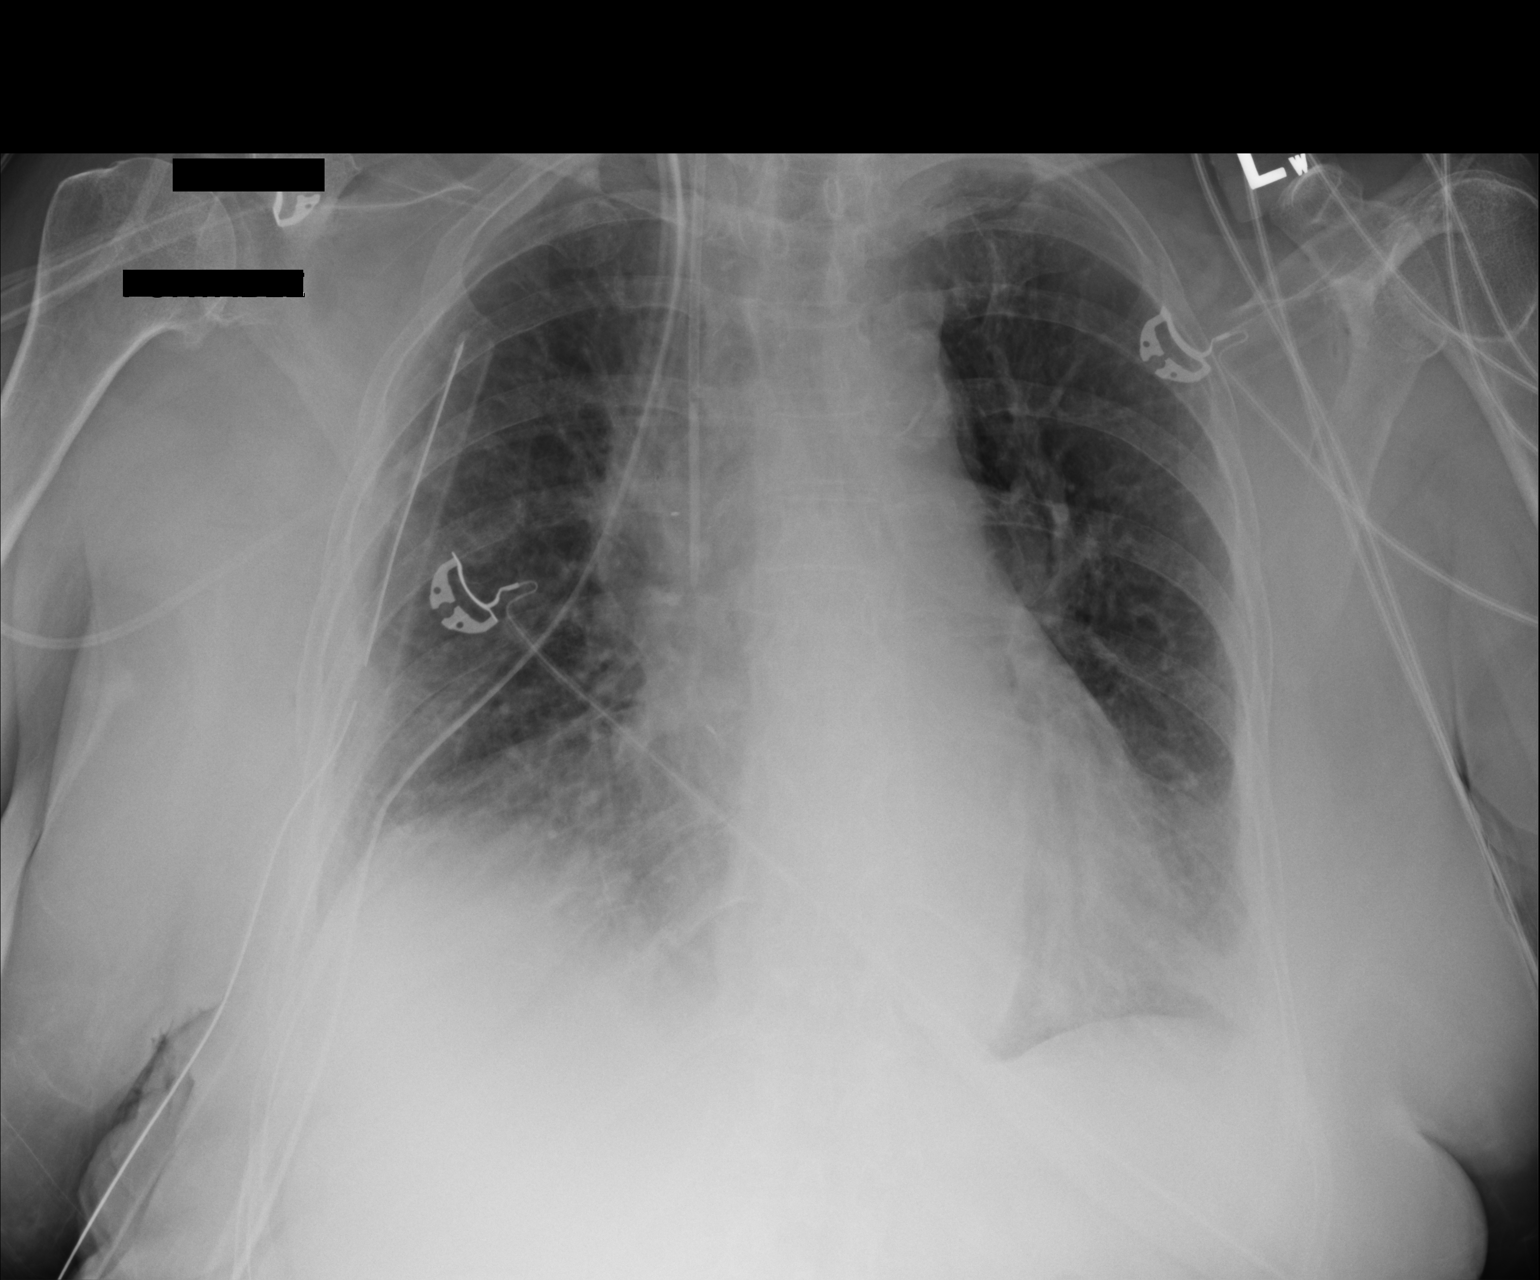

[1 of 1 positions shown; findings below may reference images not displayed]

FINDINGS: Right IJ line and 2 right chest tubes in stable position. Near
complete resolution of right pneumothorax with tiny apical residual.
Stable cardiomegaly. Postsurgical changes right lung. Lung volumes
with basilar atelectasis. Interim improvement small left pleural
effusion. Prior cervical spine fusion .
IMPRESSION: 1. Right IJ line and 2 right chest tubes in stable position. Near
complete resolution of right pneumothorax with tiny apical residual.
Postsurgical changes right lung.
2. Low lung volumes with mild bibasilar atelectasis. Interim
improvement small left pleural effusion.
3. Stable cardiomegaly.

## 2018-03-10 IMAGING — DX DG CHEST 2V
2 series · 2 of 2 positions shown · non-contrast
Comparison: Chest radiograph from one day prior.

CLINICAL DATA: Follow-up pneumothorax

EXAM:
CHEST  2 VIEW

[w chest pa]
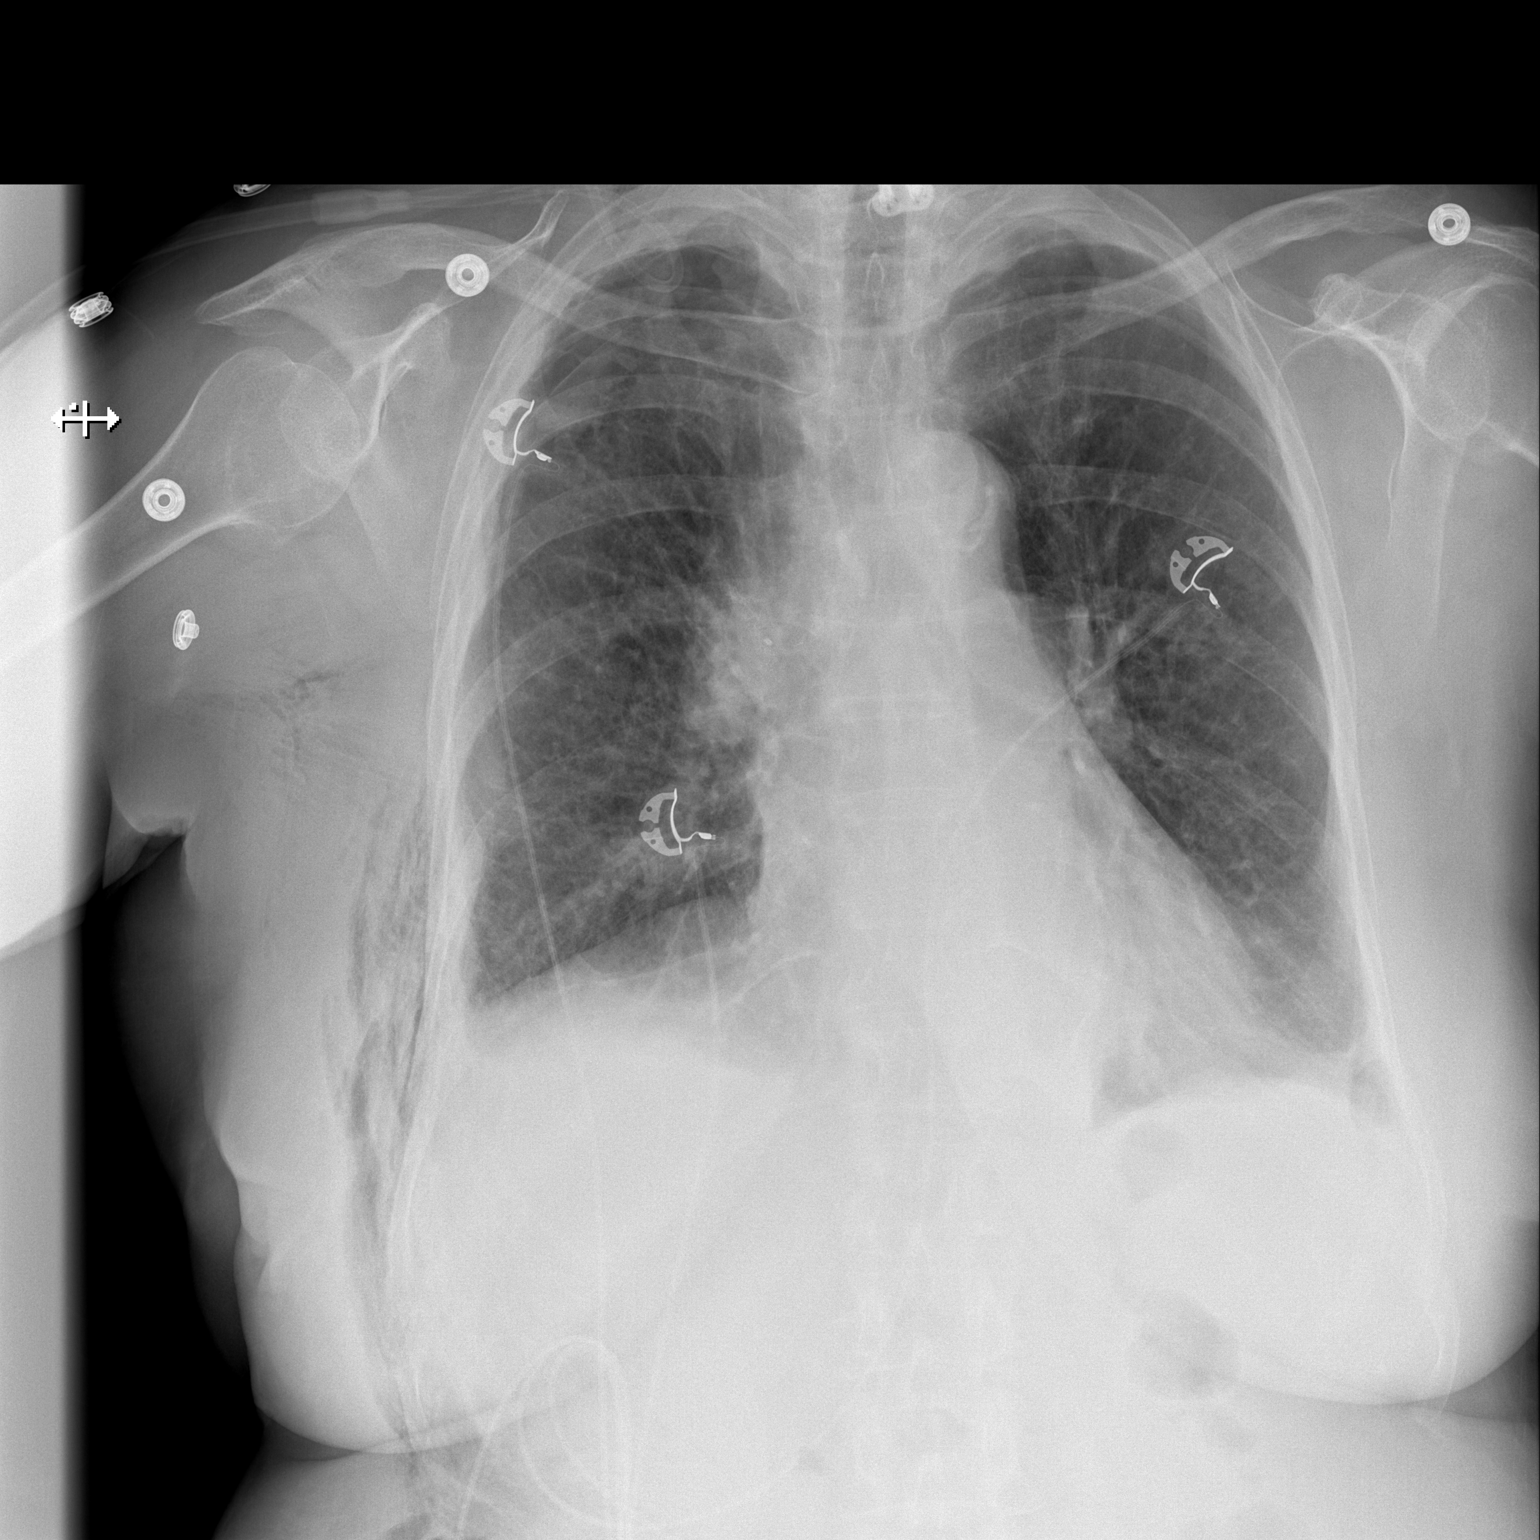

[w chest lat]
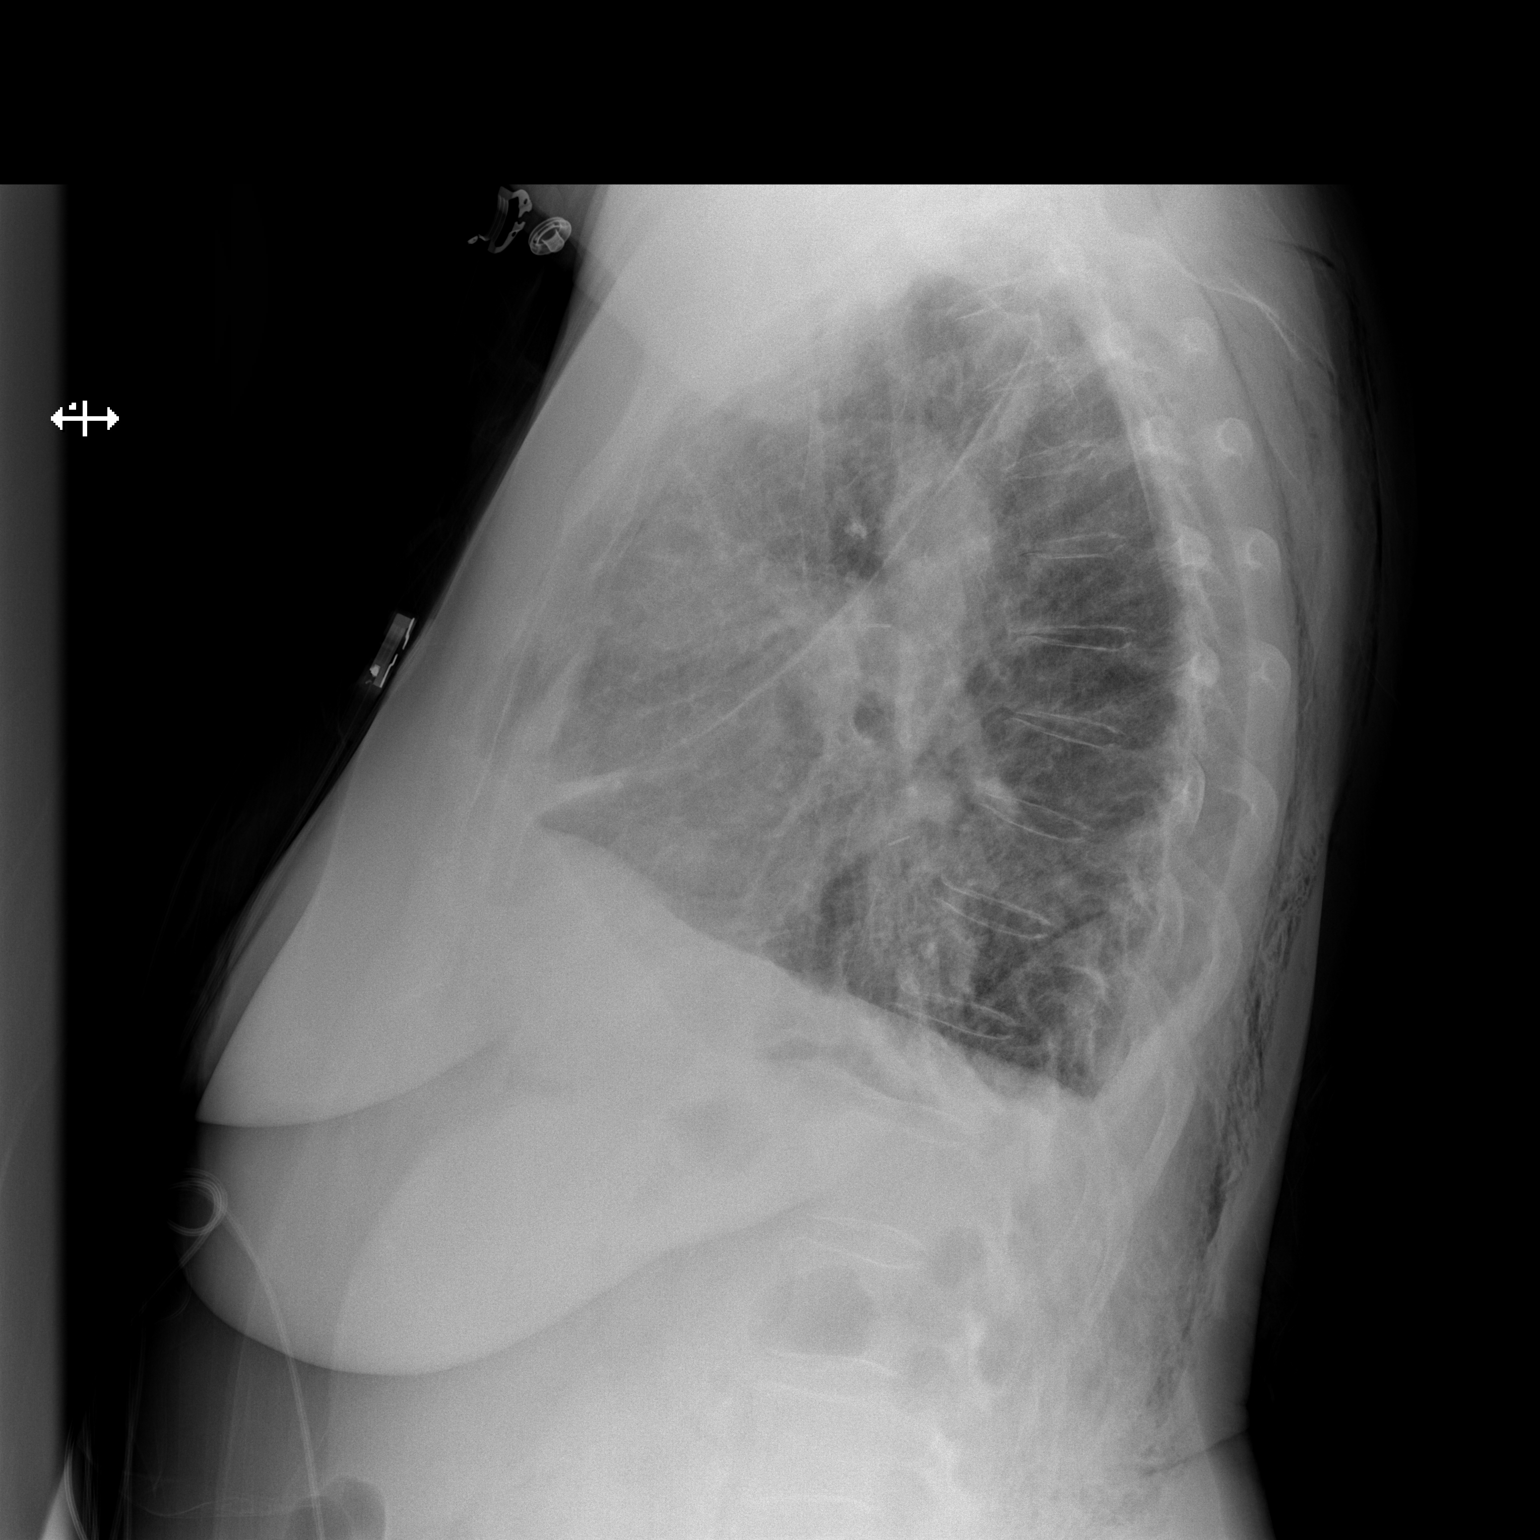

[2 of 2 positions shown; findings below may reference images not displayed]

FINDINGS: Surgical hardware from [facility] overlies the lower cervical spine.
Stable cardiomediastinal silhouette with normal heart size. Stable
small right apical pneumothorax, approximately 5-10%. No left
pneumothorax. Trace bilateral pleural effusions. Stable subcutaneous
emphysema in the right neck and right chest wall. No pulmonary
edema. Surgical sutures and clips overlie the right hilum. Stable
mild bibasilar atelectasis.
IMPRESSION: 1. Stable small right apical pneumothorax.
2. Stable mild bibasilar atelectasis.
3. Trace bilateral pleural effusions.

## 2018-03-21 ENCOUNTER — Encounter: Payer: Self-pay | Admitting: Family Medicine

## 2018-03-21 DIAGNOSIS — Z1231 Encounter for screening mammogram for malignant neoplasm of breast: Secondary | ICD-10-CM | POA: Diagnosis not present

## 2018-03-27 DIAGNOSIS — R5383 Other fatigue: Secondary | ICD-10-CM | POA: Diagnosis not present

## 2018-03-27 DIAGNOSIS — M546 Pain in thoracic spine: Secondary | ICD-10-CM | POA: Diagnosis not present

## 2018-03-27 DIAGNOSIS — E559 Vitamin D deficiency, unspecified: Secondary | ICD-10-CM | POA: Diagnosis not present

## 2018-03-27 DIAGNOSIS — M810 Age-related osteoporosis without current pathological fracture: Secondary | ICD-10-CM | POA: Diagnosis not present

## 2018-03-31 ENCOUNTER — Ambulatory Visit: Payer: Medicare Other | Admitting: Family Medicine

## 2018-04-09 ENCOUNTER — Encounter: Payer: Self-pay | Admitting: Family Medicine

## 2018-04-09 ENCOUNTER — Telehealth: Payer: Self-pay | Admitting: Family Medicine

## 2018-04-09 NOTE — Telephone Encounter (Signed)
Copied from [place] [id number]. Topic: Quick Communication - See Telephone Encounter >> Apr 09, 2018 11:32 AM Bea Graff, NT wrote: CRM for notification. See Telephone encounter for: 04/09/18. Beverlee Nims with [employer] calling to confirm office received the oxygen equipment form was received by the office and she would like to see if this can be faxed back today in order for pt to get the equipment. CB#: [phone]. May leave voicemail

## 2018-04-11 NOTE — Telephone Encounter (Signed)
Phone call to patient. Unable to reach. If patient calls back, please confirm she would like oxygen ordered through [employer].

## 2018-04-26 ENCOUNTER — Other Ambulatory Visit: Payer: Self-pay | Admitting: Urgent Care

## 2018-05-09 ENCOUNTER — Telehealth: Payer: Self-pay | Admitting: Internal Medicine

## 2018-05-09 ENCOUNTER — Other Ambulatory Visit: Payer: Self-pay | Admitting: Family Medicine

## 2018-05-09 NOTE — Telephone Encounter (Signed)
Called pt re appt being moved - spoke w/ pt re appts.

## 2018-05-12 ENCOUNTER — Ambulatory Visit (HOSPITAL_COMMUNITY)
Admission: RE | Admit: 2018-05-12 | Discharge: 2018-05-12 | Disposition: A | Discharge status: Home / Self Care | Payer: Medicare Other | Source: Ambulatory Visit | Attending: Internal Medicine | Admitting: Internal Medicine

## 2018-05-12 ENCOUNTER — Inpatient Hospital Stay: Payer: Medicare Other | Attending: Internal Medicine

## 2018-05-12 ENCOUNTER — Encounter (HOSPITAL_COMMUNITY): Payer: Self-pay

## 2018-05-12 DIAGNOSIS — C3412 Malignant neoplasm of upper lobe, left bronchus or lung: Secondary | ICD-10-CM | POA: Diagnosis not present

## 2018-05-12 DIAGNOSIS — G4700 Insomnia, unspecified: Secondary | ICD-10-CM | POA: Insufficient documentation

## 2018-05-12 DIAGNOSIS — J449 Chronic obstructive pulmonary disease, unspecified: Secondary | ICD-10-CM | POA: Insufficient documentation

## 2018-05-12 DIAGNOSIS — M438X4 Other specified deforming dorsopathies, thoracic region: Secondary | ICD-10-CM | POA: Insufficient documentation

## 2018-05-12 DIAGNOSIS — Z7982 Long term (current) use of aspirin: Secondary | ICD-10-CM | POA: Insufficient documentation

## 2018-05-12 DIAGNOSIS — Z9221 Personal history of antineoplastic chemotherapy: Secondary | ICD-10-CM | POA: Diagnosis not present

## 2018-05-12 DIAGNOSIS — C3490 Malignant neoplasm of unspecified part of unspecified bronchus or lung: Secondary | ICD-10-CM | POA: Insufficient documentation

## 2018-05-12 DIAGNOSIS — F418 Other specified anxiety disorders: Secondary | ICD-10-CM | POA: Insufficient documentation

## 2018-05-12 DIAGNOSIS — Z923 Personal history of irradiation: Secondary | ICD-10-CM | POA: Insufficient documentation

## 2018-05-12 DIAGNOSIS — C778 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: Secondary | ICD-10-CM | POA: Diagnosis not present

## 2018-05-12 DIAGNOSIS — I700 Atherosclerosis of aorta: Secondary | ICD-10-CM | POA: Diagnosis not present

## 2018-05-12 DIAGNOSIS — J984 Other disorders of lung: Secondary | ICD-10-CM | POA: Diagnosis not present

## 2018-05-12 DIAGNOSIS — E785 Hyperlipidemia, unspecified: Secondary | ICD-10-CM | POA: Diagnosis not present

## 2018-05-12 DIAGNOSIS — R918 Other nonspecific abnormal finding of lung field: Secondary | ICD-10-CM | POA: Insufficient documentation

## 2018-05-12 DIAGNOSIS — Z85528 Personal history of other malignant neoplasm of kidney: Secondary | ICD-10-CM | POA: Diagnosis not present

## 2018-05-12 DIAGNOSIS — J439 Emphysema, unspecified: Secondary | ICD-10-CM | POA: Insufficient documentation

## 2018-05-12 DIAGNOSIS — Z981 Arthrodesis status: Secondary | ICD-10-CM | POA: Diagnosis not present

## 2018-05-12 DIAGNOSIS — Z79899 Other long term (current) drug therapy: Secondary | ICD-10-CM | POA: Insufficient documentation

## 2018-05-12 DIAGNOSIS — Z902 Acquired absence of lung [part of]: Secondary | ICD-10-CM | POA: Insufficient documentation

## 2018-05-12 DIAGNOSIS — R011 Cardiac murmur, unspecified: Secondary | ICD-10-CM | POA: Insufficient documentation

## 2018-05-12 DIAGNOSIS — I10 Essential (primary) hypertension: Secondary | ICD-10-CM | POA: Diagnosis not present

## 2018-05-12 LAB — CBC WITH DIFFERENTIAL/PLATELET
Basophils Absolute: 0 10*3/uL (ref 0.0–0.1)
Basophils Relative: 0 %
Eosinophils Absolute: 0.1 10*3/uL (ref 0.0–0.5)
Eosinophils Relative: 2 %
HCT: 41.6 % (ref 34.8–46.6)
Hemoglobin: 13.4 g/dL (ref 11.6–15.9)
Lymphocytes Relative: 21 %
Lymphs Abs: 1.8 10*3/uL (ref 0.9–3.3)
MCH: 31.6 pg (ref 25.1–34.0)
MCHC: 32.2 g/dL (ref 31.5–36.0)
MCV: 98.1 fL (ref 79.5–101.0)
Monocytes Absolute: 0.6 10*3/uL (ref 0.1–0.9)
Monocytes Relative: 7 %
Neutro Abs: 5.8 10*3/uL (ref 1.5–6.5)
Neutrophils Relative %: 70 %
Platelets: 262 10*3/uL (ref 145–400)
RBC: 4.24 MIL/uL (ref 3.70–5.45)
RDW: 15.9 % — ABNORMAL HIGH (ref 11.2–14.5)
WBC: 8.3 10*3/uL (ref 3.9–10.3)

## 2018-05-12 LAB — COMPREHENSIVE METABOLIC PANEL
ALT: 20 U/L (ref 0–44)
AST: 20 U/L (ref 15–41)
Albumin: 4.2 g/dL (ref 3.5–5.0)
Alkaline Phosphatase: 84 U/L (ref 38–126)
Anion gap: 7 (ref 5–15)
BUN: 20 mg/dL (ref 8–23)
CO2: 30 mmol/L (ref 22–32)
Calcium: 10.1 mg/dL (ref 8.9–10.3)
Chloride: 104 mmol/L (ref 98–111)
Creatinine, Ser: 1.13 mg/dL — ABNORMAL HIGH (ref 0.44–1.00)
GFR calc Af Amer: 54 mL/min — ABNORMAL LOW (ref >60)
GFR calc non Af Amer: 47 mL/min — ABNORMAL LOW (ref >60)
Glucose, Bld: 101 mg/dL — ABNORMAL HIGH (ref 70–99)
Potassium: 4.5 mmol/L (ref 3.5–5.1)
Sodium: 141 mmol/L (ref 135–145)
Total Bilirubin: 0.4 mg/dL (ref 0.3–1.2)
Total Protein: 7.3 g/dL (ref 6.5–8.1)

## 2018-05-12 MED ORDER — IOHEXOL 300 MG/ML  SOLN
75.0000 mL | Freq: Once | INTRAMUSCULAR | Status: AC | PRN
  Administered 2018-05-12: 60 mL via INTRAVENOUS

## 2018-05-13 ENCOUNTER — Telehealth: Payer: Self-pay | Admitting: Family Medicine

## 2018-05-13 NOTE — Telephone Encounter (Signed)
Copied from [place] [phone]. Topic: Quick Communication - See Telephone Encounter >> May 13, 2018  6:50 AM Hewitt Shorts wrote: Larene Beach from [facility] is calling to state that the oxygen form that was sent needed to be corrected -the o2 stats was on the wrong line and needs to be  Marked out and put on the correct line   Best number is [phone] ext.[place]

## 2018-05-15 NOTE — Telephone Encounter (Signed)
Message re: 02 orders sent to Dr. Tamala Julian. Unable to find document in Media, no notes on when it was sent or by whom.

## 2018-05-18 NOTE — Telephone Encounter (Signed)
Corrected form; will refax on Monday, 05/19/18.

## 2018-05-19 ENCOUNTER — Other Ambulatory Visit: Payer: Self-pay

## 2018-05-19 ENCOUNTER — Other Ambulatory Visit: Payer: Self-pay | Admitting: Family Medicine

## 2018-05-19 ENCOUNTER — Ambulatory Visit (INDEPENDENT_AMBULATORY_CARE_PROVIDER_SITE_OTHER): Payer: Medicare Other | Admitting: Family Medicine

## 2018-05-19 ENCOUNTER — Encounter: Payer: Self-pay | Admitting: Family Medicine

## 2018-05-19 VITALS — BP 150/88 | HR 84 | Temp 98.8°F | Resp 18 | Ht 64.0 in | Wt 167.2 lb

## 2018-05-19 DIAGNOSIS — C642 Malignant neoplasm of left kidney, except renal pelvis: Secondary | ICD-10-CM

## 2018-05-19 DIAGNOSIS — I10 Essential (primary) hypertension: Secondary | ICD-10-CM

## 2018-05-19 DIAGNOSIS — C3491 Malignant neoplasm of unspecified part of right bronchus or lung: Secondary | ICD-10-CM

## 2018-05-19 DIAGNOSIS — E7800 Pure hypercholesterolemia, unspecified: Secondary | ICD-10-CM

## 2018-05-19 DIAGNOSIS — N289 Disorder of kidney and ureter, unspecified: Secondary | ICD-10-CM | POA: Diagnosis not present

## 2018-05-19 DIAGNOSIS — J449 Chronic obstructive pulmonary disease, unspecified: Secondary | ICD-10-CM | POA: Diagnosis not present

## 2018-05-19 DIAGNOSIS — Z8781 Personal history of (healed) traumatic fracture: Secondary | ICD-10-CM

## 2018-05-19 DIAGNOSIS — M5432 Sciatica, left side: Secondary | ICD-10-CM

## 2018-05-19 DIAGNOSIS — M4316 Spondylolisthesis, lumbar region: Secondary | ICD-10-CM | POA: Diagnosis not present

## 2018-05-19 LAB — CBC WITH DIFFERENTIAL/PLATELET
Basophils Absolute: 0 10*3/uL (ref 0.0–0.2)
Basos: 1 %
EOS (ABSOLUTE): 0.1 10*3/uL (ref 0.0–0.4)
Eos: 2 %
Hematocrit: 41.7 % (ref 34.0–46.6)
Hemoglobin: 14 g/dL (ref 11.1–15.9)
Immature Grans (Abs): 0 10*3/uL (ref 0.0–0.1)
Immature Granulocytes: 0 %
Lymphocytes Absolute: 1.7 10*3/uL (ref 0.7–3.1)
Lymphs: 29 %
MCH: 31.3 pg (ref 26.6–33.0)
MCHC: 33.6 g/dL (ref 31.5–35.7)
MCV: 93 fL (ref 79–97)
Monocytes Absolute: 0.6 10*3/uL (ref 0.1–0.9)
Monocytes: 10 %
Neutrophils Absolute: 3.4 10*3/uL (ref 1.4–7.0)
Neutrophils: 58 %
Platelets: 304 10*3/uL (ref 150–450)
RBC: 4.48 x10E6/uL (ref 3.77–5.28)
RDW: 16.3 % — ABNORMAL HIGH (ref 12.3–15.4)
WBC: 5.8 10*3/uL (ref 3.4–10.8)

## 2018-05-19 LAB — LIPID PANEL
Chol/HDL Ratio: 2.2 ratio (ref 0.0–4.4)
Cholesterol, Total: 183 mg/dL (ref 100–199)
HDL: 83 mg/dL (ref >39)
LDL Calculated: 85 mg/dL (ref 0–99)
Triglycerides: 76 mg/dL (ref 0–149)
VLDL Cholesterol Cal: 15 mg/dL (ref 5–40)

## 2018-05-19 LAB — COMPREHENSIVE METABOLIC PANEL
ALT: 17 IU/L (ref 0–32)
AST: 19 IU/L (ref 0–40)
Albumin/Globulin Ratio: 1.8 (ref 1.2–2.2)
Albumin: 4.6 g/dL (ref 3.5–4.8)
Alkaline Phosphatase: 88 IU/L (ref 39–117)
BUN/Creatinine Ratio: 16 (ref 12–28)
BUN: 20 mg/dL (ref 8–27)
Bilirubin Total: 0.4 mg/dL (ref 0.0–1.2)
CO2: 25 mmol/L (ref 20–29)
Calcium: 10 mg/dL (ref 8.7–10.3)
Chloride: 102 mmol/L (ref 96–106)
Creatinine, Ser: 1.25 mg/dL — ABNORMAL HIGH (ref 0.57–1.00)
GFR calc Af Amer: 49 mL/min/{1.73_m2} — ABNORMAL LOW (ref >59)
GFR calc non Af Amer: 42 mL/min/{1.73_m2} — ABNORMAL LOW (ref >59)
Globulin, Total: 2.6 g/dL (ref 1.5–4.5)
Glucose: 109 mg/dL — ABNORMAL HIGH (ref 65–99)
Potassium: 5.4 mmol/L — ABNORMAL HIGH (ref 3.5–5.2)
Sodium: 142 mmol/L (ref 134–144)
Total Protein: 7.2 g/dL (ref 6.0–8.5)

## 2018-05-19 MED ORDER — FLUTICASONE-SALMETEROL 250-50 MCG/DOSE IN AEPB: 1.0000 | INHALATION_SPRAY | Freq: Two times a day (BID) | RESPIRATORY_TRACT | 11 refills | Status: DC

## 2018-05-19 MED ORDER — ALENDRONATE SODIUM 70 MG PO TABS: 70.0000 mg | ORAL_TABLET | ORAL | 11 refills | Status: DC

## 2018-05-19 MED ORDER — TIZANIDINE HCL 2 MG PO CAPS: 2.0000 mg | ORAL_CAPSULE | Freq: Three times a day (TID) | ORAL | 1 refills | Status: DC | PRN

## 2018-05-19 MED ORDER — TRAMADOL HCL 50 MG PO TABS: 50.0000 mg | ORAL_TABLET | Freq: Four times a day (QID) | ORAL | 1 refills | Status: DC | PRN

## 2018-05-19 MED ORDER — TIOTROPIUM BROMIDE MONOHYDRATE 18 MCG IN CAPS: 18.0000 ug | ORAL_CAPSULE | Freq: Every day | RESPIRATORY_TRACT | 12 refills | Status: DC

## 2018-05-19 MED ORDER — METOPROLOL SUCCINATE ER 50 MG PO TB24: 50.0000 mg | ORAL_TABLET | Freq: Every day | ORAL | 1 refills | Status: DC

## 2018-05-19 NOTE — Progress Notes (Signed)
Subjective:    Patient ID: Tammy Ball, female    DOB: 01/15/1941, 77 y.o.   MRN: [id number]  05/19/2018  Osteoporosis (wants to discuss her medication); Hyperlipidemia (follow up; states she just had blood work done at the hospital); and Medication Refill (need refills on all her medication)    HPI This 77 y.o. female presents for four month follow-up of hypertension, hypercholesterolemia, COPD,  Sciatica, lung cancer.  Management changes made last visit include the following: Sciatica:  S/p admission for COPD exacerbation due to acute influenza A infection ;s/p admission; complete Tamiflu therapy; extension of prednisone taper provided today; rx for Robitussin with codeine provided; clarify barrier to filling albuterol nebulizer solution with pharmacy; continue nebulizer treatments every 6 hours for the next week and then PRN.  Continue oxygen supplementation as needed.  Obtain labs.  Admission records reviewed in detail during visit today.  Non-small cell carcinoma RIGHT lung stage 2: s/p lobectomy; managed by oncology.  Acute kidney injury: New onset due to acute influenza infection; resolved with gentle iv hydration; repeat labs today; encourage increased water intake at home.  Herpes Zoster infection: New; onset with influenza A infection; resolving.  Minimal symptoms/pain.  HTN: elevated today; patient with headache during visit; recommend monitoring blood pressure closely at home for the next week and to call office with BP update.   UPDATE: Now SOB all the time; with physical activity, uses oxygen because SOB; oxygen level goes down to 93%; lowest oxygen level is 89%.   Has just started feeling better in the past 1-2 weeks.   Wheezes every day.  Using Advair 1 puff twice daily.   Has not used Albuterol in weeks.  But wheezes every night.  Must get out of bed to use it.  Sometimes changes positions helps with wheezing.  No heartburn/reflux.  Mild chest congestion.  Has  plenty of nebulizer solution.   Has been working; starts 8:30-2:30.  Able to ambulate at work just fine; must get up because of back pain. Must walk to relieve back pain. Back is really annoying.  Not discharged on Spiriva.   Not smoking.  Horrible flu.   Got portable oxygen.   Changed companies; written rx for portable oxygen.  Did research; six pounds is heavy when trying to exercise or walk prolonged durations; has 2 pound oxygen portable. Saw Dr. Melvyn Novas once.  Did not recommend follow-up. S/p CT chest last week: stable CT.  LEFT RCC: previously followed by nephrology; followed annually by urologist; obtains yearly CXR after RCC.  Questioned CT scans yearly; did not perform yearly CT cans due to increase radiation.   Avoids all NSAIDs; only takes Tylenol.  Tramadol really helps with back pain; msucle realxers really help as much as Tramadol.  Helps with middle back.  Osteoporosis: saw orthopedic specialist; s/p bone density scan +osteopenia; yet with history of compression fractures, recommended monthly shots; refuses new monthly medication; then recommended twice yearly shot.  Insurance does cover once every six months.  Asked about Fosamax; made joints hurt; took less than once month.  Asked about side effects becoming more tolerable; would like to retry.   Supplements: Vitamin C and Vitamin B12 supplements.   CBD oil:  Is it safe.  No studies; no safety.    HTN: home BP 130/70.  Usually runs 130s/70s.  Checks it at home every morning and afternoons; highest readings 132/70.  Might be 62 diastolic; heart rate 06-30.    Sciatica: Taking muscle relaxer twice per  month; taking Tramadol more often; once last week.  Just filled last rx.     BP Readings from Last 3 Encounters:  05/19/18 (!) 150/88  03/04/18 134/80  02/25/18 (!) 169/103   Wt Readings from Last 3 Encounters:  05/19/18 167 lb 3.2 oz (75.8 kg)  03/04/18 170 lb (77.1 kg)  02/25/18 171 lb 6.4 oz (77.7 kg)   Immunization History   Administered Date(s) Administered  . Hepatitis B 11/12/1998  . Influenza Split 09/12/2012  . Influenza, High Dose Seasonal PF 08/23/2017  . Influenza,inj,Quad PF,6+ Mos 07/18/2013  . Influenza-Unspecified 07/29/2014, 07/23/2015, 08/25/2016  . Pneumococcal Conjugate-13 07/29/2015  . Pneumococcal Polysaccharide-23 11/12/2000, 10/23/2012, 08/23/2017  . Pneumococcal-Unspecified 10/23/2012, 08/23/2017  . Tdap 12/02/2013  . Zoster 12/13/2010    Review of Systems  Constitutional: Negative for activity change, appetite change, chills, diaphoresis, fatigue, fever and unexpected weight change.  HENT: Negative for congestion, dental problem, drooling, ear discharge, ear pain, facial swelling, hearing loss, mouth sores, nosebleeds, postnasal drip, rhinorrhea, sinus pressure, sneezing, sore throat, tinnitus, trouble swallowing and voice change.   Eyes: Negative for photophobia, pain, discharge, redness, itching and visual disturbance.  Respiratory: Positive for shortness of breath. Negative for apnea, cough, choking, chest tightness, wheezing and stridor.   Cardiovascular: Negative for chest pain, palpitations and leg swelling.  Gastrointestinal: Negative for abdominal distention, abdominal pain, anal bleeding, blood in stool, constipation, diarrhea, nausea, rectal pain and vomiting.  Endocrine: Negative for cold intolerance, heat intolerance, polydipsia, polyphagia and polyuria.  Genitourinary: Negative for decreased urine volume, difficulty urinating, dyspareunia, dysuria, enuresis, flank pain, frequency, genital sores, hematuria, menstrual problem, pelvic pain, urgency, vaginal bleeding, vaginal discharge and vaginal pain.  Musculoskeletal: Positive for back pain and myalgias. Negative for arthralgias, gait problem, joint swelling, neck pain and neck stiffness.  Skin: Negative for color change, pallor, rash and wound.  Allergic/Immunologic: Negative for environmental allergies, food allergies and  immunocompromised state.  Neurological: Negative for dizziness, tremors, seizures, syncope, facial asymmetry, speech difficulty, weakness, light-headedness, numbness and headaches.  Hematological: Negative for adenopathy. Does not bruise/bleed easily.  Psychiatric/Behavioral: Negative for agitation, behavioral problems, confusion, decreased concentration, dysphoric mood, hallucinations, self-injury, sleep disturbance and suicidal ideas. The patient is not nervous/anxious and is not hyperactive.     Past Medical History:  Diagnosis Date  . Anemia    was while doing chemo  . Anxiety   . Asthma   . Chemotherapy-induced neuropathy ([place]) 11/01/2015  . Claustrophobia   . COPD (chronic obstructive pulmonary disease) ([place])   . Depression   . Encounter for antineoplastic chemotherapy 02/09/2016  . Glaucoma   . Headache    prior to menopause  . Heart murmur   . History of echocardiogram    Echo 2/17: EF 42-68%, grade 1 diastolic dysfunction, mild MR, trivial pericardial effusion  . History of nuclear stress test    Myoview 2/17: no ischemia or scar, EF 79%; low risk  . History of radiation therapy 10/30/16-11/06/16  . Hyperlipidemia   . Hypertension   . Insomnia 05/16/2016  . Non-small cell carcinoma of right lung, stage 2 ([place]) 10/03/2015  . Radiation 02/29/16-04/10/16   50.4 Gy to right central chest  . Renal cell carcinoma ([place])    L nephrectomy  in 2012   Past Surgical History:  Procedure Laterality Date  . BACK SURGERY     cervical 1991  . EYE SURGERY    . IR GENERIC HISTORICAL  01/16/2017   IR RADIOLOGIST EVAL & MGMT 01/16/2017 [facility]  .  IR GENERIC HISTORICAL  01/31/2017   IR FLUORO GUIDED NEEDLE PLC ASPIRATION/INJECTION LOC 01/31/2017 Luanne Bras, MD [facility]  . IR GENERIC HISTORICAL  01/31/2017   IR VERTEBROPLASTY CERV/THOR BX INC UNI/BIL INC/INJECT/IMAGING 01/31/2017 Luanne Bras, MD [facility]  . IR GENERIC HISTORICAL  01/31/2017   IR VERTEBROPLASTY EA ADDL  (T&LS) BX INC UNI/BIL INC INJECT/IMAGING 01/31/2017 Luanne Bras, MD [facility]  . kidney cancer    . NEPHRECTOMY    . SPINE SURGERY    . TUBAL LIGATION    . VIDEO ASSISTED THORACOSCOPY (VATS)/WEDGE RESECTION Right 01/18/2016   Procedure: VIDEO ASSISTED THORACOSCOPY (VATS)/LUNG RESECTION, THOROCOTOMY, RIGHT UPPER LOBECTOMY, LYMPH NODE DISSECTION, PLACEMENT OF ON Q;  Surgeon: [name], MD;  Location: [facility];  Service: Thoracic;  Laterality: Right;  Marland Kitchen VIDEO BRONCHOSCOPY Bilateral 09/20/2015   Procedure: VIDEO BRONCHOSCOPY WITHOUT FLUORO;  Surgeon: Rigoberto Noel, MD;  Location: [facility];  Service: Cardiopulmonary;  Laterality: Bilateral;  . VIDEO BRONCHOSCOPY N/A 01/18/2016   Procedure: VIDEO BRONCHOSCOPY;  Surgeon: [name], MD;  Location: [facility];  Service: Thoracic;  Laterality: N/A;   Allergies  Allergen Reactions  . Bee Venom Anaphylaxis, Shortness Of Breath, Swelling and Other (See Comments)    Swelling at site   . Amlodipine Swelling and Other (See Comments)    Swelling of the ankles and hands   . Levofloxacin Other (See Comments)    Joint pain   . Fosamax [Alendronate Sodium]     Joint pain  . Hctz [Hydrochlorothiazide] Palpitations and Other (See Comments)    Sweating    Current Outpatient Medications on File Prior to Visit  Medication Sig Dispense Refill  . acetaminophen (TYLENOL) 500 MG tablet Take 500 mg by mouth every 6 (six) hours as needed for mild pain, moderate pain, fever or headache. Reported on 05/24/2016    . aspirin EC 81 MG tablet Take 81 mg by mouth daily. Reported on 04/11/2016    . atorvastatin (LIPITOR) 40 MG tablet TAKE ONE TABLET BY MOUTH ONCE DAILY. 90 tablet 3  . calcium carbonate (TUMS EX) 750 MG chewable tablet Chew 1 tablet by mouth as needed for heartburn.    . Cholecalciferol (VITAMIN D3 PO) Take 1,200 mg by mouth daily.    . fish oil-omega-3 fatty acids 1000 MG capsule Take 1 capsule by mouth daily.     . traZODone (DESYREL) 50 MG  tablet TAKE 0.5-1 TABLETS (25-50 MG TOTAL) BY MOUTH AT BEDTIME AS NEEDED FOR SLEEP. 90 tablet 1   No current facility-administered medications on file prior to visit.    Social History   Socioeconomic History  . Marital status: Widowed    Spouse name: Not on file  . Number of children: 4  . Years of education: Not on file  . Highest education level: High school graduate  Occupational History  . Not on file  Social Needs  . Financial resource strain: Not hard at all  . Food insecurity:    Worry: Never true    Inability: Never true  . Transportation needs:    Medical: No    Non-medical: No  Tobacco Use  . Smoking status: Former Smoker    Packs/day: 0.50    Years: 50.00    Pack years: 25.00    Types: Cigarettes    Last attempt to quit: 03/16/2011    Years since quitting: 7.1  . Smokeless tobacco: Never Used  Substance and Sexual Activity  . Alcohol use: No  Alcohol/week: 0.0 oz  . Drug use: No  . Sexual activity: Not Currently  Lifestyle  . Physical activity:    Days per week: 0 days    Minutes per session: 0 min  . Stress: Only a little  Relationships  . Social connections:    Talks on phone: More than three times a week    Gets together: More than three times a week    Attends religious service: More than 4 times per year    Active member of club or organization: Yes    Attends meetings of clubs or organizations: More than 4 times per year    Relationship status: Widowed  . Intimate partner violence:    Fear of current or ex partner: No    Emotionally abused: No    Physically abused: No    Forced sexual activity: No  Other Topics Concern  . Not on file  Social History Narrative   Marital status: divorced; not dating in 2019.      Children: 4 children; 3 grandchildren adult; 4 gg.      Lives: alone in house      Employment: part-time work; substance abuse counselor; [employer].      Tobacco: quit smoking 2012; smoked 45 years      Alcohol: none       Drugs: none      Exercise: none in 2019; due to LEFT sciatica.      ADLs: independent with ADLs; drives.       Advanced Directives: YES: HCPOA: Nicholas Martinez/son.  FULL CODE but no prolonged measures.      Occupation: Substance Abuse Estate agent   No exercise** Merged History Encounter **       ** Data from: 12/14/11 Enc Dept: [facility]       ** Data from: 12/17/11 Enc Dept: [facility]   Substance abuse counselor   Husband deceased   4 great grandchildren   Son works in same substance abuse counseling center as patient   Family History  Problem Relation Age of Onset  . Heart disease Sister   . Obesity Brother   . Heart attack Daughter 76       s/p CABG  . Glaucoma Daughter   . Breast cancer Sister   . Birth defects Sister   . Cancer Mother        Bladder Cancer  . Hypertension Mother   . CAD Mother 66  . Cancer Maternal Grandmother        Objective:    BP (!) 150/88   Pulse 84   Temp 98.8 F (37.1 C) (Oral)   Resp 18   Ht 5\' 4"  (1.626 m)   Wt 167 lb 3.2 oz (75.8 kg)   SpO2 93%   BMI 28.70 kg/m  Physical Exam  Constitutional: She is oriented to person, place, and time. She appears well-developed and well-nourished. No distress.  HENT:  Head: Normocephalic and atraumatic.  Right Ear: External ear normal.  Left Ear: External ear normal.  Nose: Nose normal.  Mouth/Throat: Oropharynx is clear and moist.  Eyes: Pupils are equal, round, and reactive to light. Conjunctivae and EOM are normal.  Neck: Normal range of motion and full passive range of motion without pain. Neck supple. No JVD present. Carotid bruit is not present. No thyromegaly present.  Cardiovascular: Normal rate, regular rhythm and normal heart sounds. Exam reveals no gallop and no friction  rub.  No murmur heard. Pulmonary/Chest: Effort normal and breath sounds normal. She has no wheezes. She has no rales.  Abdominal: Soft. Bowel sounds are normal.  She exhibits no distension and no mass. There is no tenderness. There is no rebound and no guarding.  Musculoskeletal:       Right shoulder: Normal.       Left shoulder: Normal.       Cervical back: Normal.       Thoracic back: Normal. She exhibits normal range of motion, no tenderness and no bony tenderness.       Lumbar back: She exhibits normal range of motion, no tenderness, no bony tenderness, no pain and no spasm.  Lymphadenopathy:    She has no cervical adenopathy.  Neurological: She is alert and oriented to person, place, and time. She has normal reflexes. No cranial nerve deficit. She exhibits normal muscle tone. Coordination normal.  Skin: Skin is warm and dry. No rash noted. She is not diaphoretic. No erythema. No pallor.  Psychiatric: She has a normal mood and affect. Her behavior is normal. Judgment and thought content normal.  Nursing note and vitals reviewed.  No results found. Depression screen [facility] 2/9 05/19/2018 03/04/2018 01/23/2018 11/13/2017 11/13/2017  Decreased Interest 0 0 0 0 0  Down, Depressed, Hopeless 0 0 0 0 1  PHQ - 2 Score 0 0 0 0 1  Some recent data might be hidden   Fall Risk  05/19/2018 03/04/2018 11/13/2017 11/13/2017 03/05/2017  Falls in the past year? No No No No No        Assessment & Plan:   1. COPD GOLD II    2. Essential hypertension   3. Pure hypercholesterolemia   4. Non-small cell carcinoma of right lung, stage 2 ([place])   5. Sciatica of left side   6. Spondylolisthesis of lumbar region   7. History of compression fracture of spine   8. Renal cell carcinoma of left kidney ([facility])   9. Renal insufficiency    COPD Gold 2: Uncontrolled since March 2019 when suffered with acute influenza illness that required hospitalization.  Continue Advair twice daily.  Add Spiriva daily.  Recommend DuoNeb every morning and every evening for the next month.  Increase physical activity over the upcoming month as well.  Status post recent CT chest on 7/1/still requiring  oxygen supplementation 19 that was stable without acute processes.  Daily due to dyspnea on exertion.  If no improvement in upcoming month, refer back to pulmonology.  Hypertension: Moderately controlled in office today.  Better home readings.  No adjustments to medications today.  Refills provided.  Non-small cell carcinoma of right lung: Followed closely by Dr. Earlie Server of oncology.  Status post repeat CT chest on 05/12/2018.  Left-sided sciatica: Persistent.  Refill of tramadol and muscle relaxer for as needed use.  Using tramadol once weekly on average.  Osteoporosis/history of compression fracture of spine: Status post consultation by Dr. Alysia Penna of sports medicine.  Intolerant to Fosamax; suffered with diffuse joint aches.  Discussed other treatment options with Dr. Alysia Penna.  Patient desires rechallenge with Fosamax.  If he does not tolerate Fosamax, consider trial of once monthly Actonel or Boniva.  Encourage weightbearing exercises and calcium supplementation.  Renal cell carcinoma of left kidney: Status post nephrectomy.  Creatinine level slightly elevated during admission.  Will repeat today.  Mild elevation of creatinine during hospitalization.  Likely will not warrant referral to nephrology.  Advised patient to avoid nephrotoxic medications such  as NSAIDs.   Orders Placed This Encounter  Procedures  . Lipid panel  . CBC with Differential/Platelet  . Comprehensive metabolic panel   Meds ordered this encounter  Medications  . tiotropium (SPIRIVA) 18 MCG inhalation capsule    Sig: Place 1 capsule (18 mcg total) into inhaler and inhale daily.    Dispense:  30 capsule    Refill:  12  . alendronate (FOSAMAX) 70 MG tablet    Sig: Take 1 tablet (70 mg total) by mouth every 7 (seven) days. Take with a full glass of water on an empty stomach.    Dispense:  4 tablet    Refill:  11  . Fluticasone-Salmeterol (ADVAIR) 250-50 MCG/DOSE AEPB    Sig: Inhale 1 puff into the lungs 2 (two)  times daily.    Dispense:  60 each    Refill:  11  . metoprolol succinate (TOPROL-XL) 50 MG 24 hr tablet    Sig: Take 1 tablet (50 mg total) by mouth daily. Take with or immediately following a meal.    Dispense:  90 tablet    Refill:  1  . tizanidine (ZANAFLEX) 2 MG capsule    Sig: Take 1 capsule (2 mg total) by mouth 3 (three) times daily as needed for muscle spasms.    Dispense:  60 capsule    Refill:  1  . traMADol (ULTRAM) 50 MG tablet    Sig: Take 1 tablet (50 mg total) by mouth every 6 (six) hours as needed (BACK PAIN).    Dispense:  60 tablet    Refill:  1    Return in about 6 months (around 11/19/2018) for follow-up chronic medical conditions STALLINGS.   Deddrick Saindon Elayne Guerin, M.D. Primary Care at [facility] previously [facility] [place] [address] [place], [place]  [id number] [phone] phone [phone] fax

## 2018-05-19 NOTE — Patient Instructions (Addendum)
INCREASE NEBULIZER TO ONE EVERY MORNING AND ONE EVERY EVENING. RESTART FOSAMAX 70MG  ONCE WEEKLY.  Cholesterol Cholesterol is a fat. Your body needs a small amount of cholesterol. Cholesterol (plaque) may build up in your blood vessels (arteries). That makes you more likely to have a heart attack or stroke. You cannot feel your cholesterol level. Having a blood test is the only way to find out if your level is high. Keep your test results. Work with your doctor to keep your cholesterol at a good level. What do the results mean?  Total cholesterol is how much cholesterol is in your blood.  LDL is bad cholesterol. This is the type that can build up. Try to have low LDL.  HDL is good cholesterol. It cleans your blood vessels and carries LDL away. Try to have high HDL.  Triglycerides are fat that the body can store or burn for energy. What are good levels of cholesterol?  Total cholesterol below 200.  LDL below 100 is good for people who have health risks. LDL below 70 is good for people who have very high risks.  HDL above 40 is good. It is best to have HDL of 60 or higher.  Triglycerides below 150. How can I lower my cholesterol? Diet Follow your diet program as told by your doctor.  Choose fish, white meat chicken, or [place] that is roasted or baked. Try not to eat red meat, fried foods, sausage, or lunch meats.  Eat lots of fresh fruits and vegetables.  Choose whole grains, beans, pasta, potatoes, and cereals.  Choose olive oil, corn oil, or canola oil. Only use small amounts.  Try not to eat butter, mayonnaise, shortening, or palm kernel oils.  Try not to eat foods with trans fats.  Choose low-fat or nonfat dairy foods. ? Drink skim or nonfat milk. ? Eat low-fat or nonfat yogurt and cheeses. ? Try not to drink whole milk or cream. ? Try not to eat ice cream, egg yolks, or full-fat cheeses.  Healthy desserts include angel food cake, ginger snaps, animal crackers, hard  candy, popsicles, and low-fat or nonfat frozen yogurt. Try not to eat pastries, cakes, pies, and cookies.  Exercise Follow your exercise program as told by your doctor.  Be more active. Try gardening, walking, and taking the stairs.  Ask your doctor about ways that you can be more active.  Medicine  Take over-the-counter and prescription medicines only as told by your doctor. This information is not intended to replace advice given to you by your health care provider. Make sure you discuss any questions you have with your health care provider. Document Released: 01/25/2009 Document Revised: 05/30/2016 Document Reviewed: 05/10/2016 Elsevier Interactive Patient Education  2018 [employer]. Managing Your Hypertension Hypertension is commonly called high blood pressure. This is when the force of your blood pressing against the walls of your arteries is too strong. Arteries are blood vessels that carry blood from your heart throughout your body. Hypertension forces the heart to work harder to pump blood, and may cause the arteries to become narrow or stiff. Having untreated or uncontrolled hypertension can cause heart attack, stroke, kidney disease, and other problems. What are blood pressure readings? A blood pressure reading consists of a higher number over a lower number. Ideally, your blood pressure should be below 120/80. The first ("top") number is called the systolic pressure. It is a measure of the pressure in your arteries as your heart beats. The second ("bottom") number is called the  diastolic pressure. It is a measure of the pressure in your arteries as the heart relaxes. What does my blood pressure reading mean? Blood pressure is classified into four stages. Based on your blood pressure reading, your health care provider may use the following stages to determine what type of treatment you need, if any. Systolic pressure and diastolic pressure are measured in a unit called mm  Hg. Normal  Systolic pressure: below 485.  Diastolic pressure: below 80. Elevated  Systolic pressure: 462-703.  Diastolic pressure: below 80. Hypertension stage 1  Systolic pressure: 500-938.  Diastolic pressure: 18-29. Hypertension stage 2  Systolic pressure: 937 or above.  Diastolic pressure: 90 or above. What health risks are associated with hypertension? Managing your hypertension is an important responsibility. Uncontrolled hypertension can lead to:  A heart attack.  A stroke.  A weakened blood vessel (aneurysm).  Heart failure.  Kidney damage.  Eye damage.  Metabolic syndrome.  Memory and concentration problems.  What changes can I make to manage my hypertension? Hypertension can be managed by making lifestyle changes and possibly by taking medicines. Your health care provider will help you make a plan to bring your blood pressure within a normal range. Eating and drinking  Eat a diet that is high in fiber and potassium, and low in salt (sodium), added sugar, and fat. An example eating plan is called the DASH (Dietary Approaches to Stop Hypertension) diet. To eat this way: ? Eat plenty of fresh fruits and vegetables. Try to fill half of your plate at each meal with fruits and vegetables. ? Eat whole grains, such as whole wheat pasta, brown rice, or whole grain bread. Fill about one quarter of your plate with whole grains. ? Eat low-fat diary products. ? Avoid fatty cuts of meat, processed or cured meats, and poultry with skin. Fill about one quarter of your plate with lean proteins such as fish, chicken without skin, beans, eggs, and tofu. ? Avoid premade and processed foods. These tend to be higher in sodium, added sugar, and fat.  Reduce your daily sodium intake. Most people with hypertension should eat less than 1,500 mg of sodium a day.  Limit alcohol intake to no more than 1 drink a day for nonpregnant women and 2 drinks a day for men. One drink equals  12 oz of beer, 5 oz of wine, or 1 oz of hard liquor. Lifestyle  Work with your health care provider to maintain a healthy body weight, or to lose weight. Ask what an ideal weight is for you.  Get at least 30 minutes of exercise that causes your heart to beat faster (aerobic exercise) most days of the week. Activities may include walking, swimming, or biking.  Include exercise to strengthen your muscles (resistance exercise), such as weight lifting, as part of your weekly exercise routine. Try to do these types of exercises for 30 minutes at least 3 days a week.  Do not use any products that contain nicotine or tobacco, such as cigarettes and e-cigarettes. If you need help quitting, ask your health care provider.  Control any long-term (chronic) conditions you have, such as high cholesterol or diabetes. Monitoring  Monitor your blood pressure at home as told by your health care provider. Your personal target blood pressure may vary depending on your medical conditions, your age, and other factors.  Have your blood pressure checked regularly, as often as told by your health care provider. Working with your health care provider  Review all the  medicines you take with your health care provider because there may be side effects or interactions.  Talk with your health care provider about your diet, exercise habits, and other lifestyle factors that may be contributing to hypertension.  Visit your health care provider regularly. Your health care provider can help you create and adjust your plan for managing hypertension. Will I need medicine to control my blood pressure? Your health care provider may prescribe medicine if lifestyle changes are not enough to get your blood pressure under control, and if:  Your systolic blood pressure is 130 or higher.  Your diastolic blood pressure is 80 or higher.  Take medicines only as told by your health care provider. Follow the directions carefully.  Blood pressure medicines must be taken as prescribed. The medicine does not work as well when you skip doses. Skipping doses also puts you at risk for problems. Contact a health care provider if:  You think you are having a reaction to medicines you have taken.  You have repeated (recurrent) headaches.  You feel dizzy.  You have swelling in your ankles.  You have trouble with your vision. Get help right away if:  You develop a severe headache or confusion.  You have unusual weakness or numbness, or you feel faint.  You have severe pain in your chest or abdomen.  You vomit repeatedly.  You have trouble breathing. Summary  Hypertension is when the force of blood pumping through your arteries is too strong. If this condition is not controlled, it may put you at risk for serious complications.  Your personal target blood pressure may vary depending on your medical conditions, your age, and other factors. For most people, a normal blood pressure is less than 120/80.  Hypertension is managed by lifestyle changes, medicines, or both. Lifestyle changes include weight loss, eating a healthy, low-sodium diet, exercising more, and limiting alcohol. This information is not intended to replace advice given to you by your health care provider. Make sure you discuss any questions you have with your health care provider. Document Released: 07/23/2012 Document Revised: 09/26/2016 Document Reviewed: 09/26/2016 Elsevier Interactive Patient Education  2018 [employer].     IF you received an x-ray today, you will receive an invoice from [facility]. Please contact [facility] at [phone] with questions or concerns regarding your invoice.   IF you received labwork today, you will receive an invoice from [facility]. Please contact [facility] at [phone] with questions or concerns regarding your invoice.   Our billing staff will not be able to assist you with questions  regarding bills from these companies.  You will be contacted with the lab results as soon as they are available. The fastest way to get your results is to activate your My Chart account. Instructions are located on the last page of this paperwork. If you have not heard from [place] regarding the results in 2 weeks, please contact this office.

## 2018-05-19 NOTE — Telephone Encounter (Signed)
SPIRIVA HANDIHALER 18 MCG inhalation capsule        Changed from: tiotropium (SPIRIVA) 18 MCG inhalation capsule   Sig: INHALE 1 CAPSULE VIA HANDIHALER ONCE DAILY AT THE SAME TIME EVERY DAY   Disp:  Not specified (Pharmacy requested: 30 each)  Refills:  12   Start: 05/19/2018   Class: Normal   Last ordered: Today by Wardell Honour, MD    Pharmacy comment: Alternative Requested:NON-FORMULARY DRUG. PRESCRIBER [id number] $$502.99 OR CHANGE DRUG.     [facility] at[id number] [place], [place].

## 2018-05-20 ENCOUNTER — Other Ambulatory Visit: Payer: Medicare Other

## 2018-05-20 DIAGNOSIS — N289 Disorder of kidney and ureter, unspecified: Secondary | ICD-10-CM | POA: Insufficient documentation

## 2018-05-23 NOTE — Telephone Encounter (Signed)
Alternative request. Change drug

## 2018-05-25 NOTE — Telephone Encounter (Signed)
What muscarinic antagonist is covered Keenan Bachelor, Geryl Councilman)?

## 2018-05-27 ENCOUNTER — Telehealth: Payer: Self-pay | Admitting: Internal Medicine

## 2018-05-27 ENCOUNTER — Encounter: Payer: Self-pay | Admitting: Internal Medicine

## 2018-05-27 ENCOUNTER — Inpatient Hospital Stay (HOSPITAL_BASED_OUTPATIENT_CLINIC_OR_DEPARTMENT_OTHER): Payer: Medicare Other | Admitting: Internal Medicine

## 2018-05-27 VITALS — BP 153/79 | HR 79 | Temp 98.6°F | Resp 18 | Ht 64.0 in | Wt 170.8 lb

## 2018-05-27 DIAGNOSIS — Z79899 Other long term (current) drug therapy: Secondary | ICD-10-CM

## 2018-05-27 DIAGNOSIS — C3412 Malignant neoplasm of upper lobe, left bronchus or lung: Secondary | ICD-10-CM

## 2018-05-27 DIAGNOSIS — J449 Chronic obstructive pulmonary disease, unspecified: Secondary | ICD-10-CM

## 2018-05-27 DIAGNOSIS — I10 Essential (primary) hypertension: Secondary | ICD-10-CM | POA: Diagnosis not present

## 2018-05-27 DIAGNOSIS — Z85528 Personal history of other malignant neoplasm of kidney: Secondary | ICD-10-CM

## 2018-05-27 DIAGNOSIS — C3491 Malignant neoplasm of unspecified part of right bronchus or lung: Secondary | ICD-10-CM

## 2018-05-27 DIAGNOSIS — C778 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: Secondary | ICD-10-CM | POA: Diagnosis not present

## 2018-05-27 DIAGNOSIS — G4700 Insomnia, unspecified: Secondary | ICD-10-CM | POA: Diagnosis not present

## 2018-05-27 DIAGNOSIS — Z7982 Long term (current) use of aspirin: Secondary | ICD-10-CM

## 2018-05-27 DIAGNOSIS — E785 Hyperlipidemia, unspecified: Secondary | ICD-10-CM

## 2018-05-27 DIAGNOSIS — Z9221 Personal history of antineoplastic chemotherapy: Secondary | ICD-10-CM

## 2018-05-27 DIAGNOSIS — C3490 Malignant neoplasm of unspecified part of unspecified bronchus or lung: Secondary | ICD-10-CM

## 2018-05-27 DIAGNOSIS — Z923 Personal history of irradiation: Secondary | ICD-10-CM | POA: Diagnosis not present

## 2018-05-27 DIAGNOSIS — F418 Other specified anxiety disorders: Secondary | ICD-10-CM | POA: Diagnosis not present

## 2018-05-27 DIAGNOSIS — R011 Cardiac murmur, unspecified: Secondary | ICD-10-CM

## 2018-05-27 NOTE — Telephone Encounter (Signed)
Schedule appt per 7/16 los - gave patient aVS and calender per los. Central radiology to contact patient with ct.

## 2018-05-27 NOTE — Progress Notes (Signed)
[facility] [phone]   [phone]  OFFICE PROGRESS NOTE  Forrest Moron, MD [address] [place] [address]  DIAGNOSIS: Stage IIIA (T2a, N2, M0) non-small cell lung cancer, squamous cell carcinoma presented with large right upper lobe lung mass with right hilar involvement diagnosed in November 2016.  PRIOR THERAPY:  1) Neoadjuvant systemic chemotherapy with carboplatin for AUC of 6 and paclitaxel 200 MG/M2 every 3 weeks, status post 3 cycles with partial response. 2) Video bronchoscopy, right video-assisted thoracoscopy, minithoracotomy, right upper lobectomy with lymph node dissection on 01/18/2016. The final pathology revealed residual squamous cell carcinoma measuring 3.1 cm with close resection margin and metastatic carcinoma to lymph nodes at levels 10R, 12R and 4R. (ypT2a, yN2).  3) Concurrent chemoradiation with weekly carboplatin for AUC of 2 and paclitaxel 45 MG/M2. First dose 02/27/2016. Status post 5 cycles. Last dose was given 03/26/2016. 4) SBRT to left lower lobe lung nodule under the care of Dr. Sondra Come completed on 11/06/2016.  CURRENT THERAPY: Observation.  INTERVAL HISTORY: Tammy Ball 77 y.o. female returns to the clinic today for six-month follow-up visit.  The patient is feeling fine today with no specific complaints.  She denied having any current chest pain, shortness of breath, cough or hemoptysis.  She denied having any recent weight loss or night sweats.  She has no nausea, vomiting, diarrhea or constipation.  She has no headache or visual changes.  The patient had a repeat CT scan of the chest performed recently and she is here for evaluation and discussion of her risk her results.  MEDICAL HISTORY: Past Medical History:  Diagnosis Date  . Anemia    was while doing chemo  . Anxiety   . Asthma   . Chemotherapy-induced neuropathy ([place]) 11/01/2015  . Claustrophobia   . COPD (chronic obstructive pulmonary  disease) ([place])   . Depression   . Encounter for antineoplastic chemotherapy 02/09/2016  . Glaucoma   . Headache    prior to menopause  . Heart murmur   . History of echocardiogram    Echo 2/17: EF 76-28%, grade 1 diastolic dysfunction, mild MR, trivial pericardial effusion  . History of nuclear stress test    Myoview 2/17: no ischemia or scar, EF 79%; low risk  . History of radiation therapy 10/30/16-11/06/16  . Hyperlipidemia   . Hypertension   . Insomnia 05/16/2016  . Non-small cell carcinoma of right lung, stage 2 ([place]) 10/03/2015  . Radiation 02/29/16-04/10/16   50.4 Gy to right central chest  . Renal cell carcinoma ([facility])    L nephrectomy  in 2012    ALLERGIES:  is allergic to bee venom; amlodipine; levofloxacin; fosamax [alendronate sodium]; and hctz [hydrochlorothiazide].  MEDICATIONS:  Current Outpatient Medications  Medication Sig Dispense Refill  . alendronate (FOSAMAX) 70 MG tablet Take 1 tablet (70 mg total) by mouth every 7 (seven) days. Take with a full glass of water on an empty stomach. 4 tablet 11  . aspirin EC 81 MG tablet Take 81 mg by mouth daily. Reported on 04/11/2016    . atorvastatin (LIPITOR) 40 MG tablet TAKE ONE TABLET BY MOUTH ONCE DAILY. 90 tablet 3  . calcium carbonate (TUMS EX) 750 MG chewable tablet Chew 1 tablet by mouth as needed for heartburn.    . Cholecalciferol (VITAMIN D3 PO) Take 1,200 mg by mouth daily.    . fish oil-omega-3 fatty acids 1000 MG capsule Take 1 capsule by mouth daily.     Marland Kitchen  Fluticasone-Salmeterol (ADVAIR) 250-50 MCG/DOSE AEPB Inhale 1 puff into the lungs 2 (two) times daily. 60 each 11  . metoprolol succinate (TOPROL-XL) 50 MG 24 hr tablet Take 1 tablet (50 mg total) by mouth daily. Take with or immediately following a meal. 90 tablet 1  . traZODone (DESYREL) 50 MG tablet TAKE 0.5-1 TABLETS (25-50 MG TOTAL) BY MOUTH AT BEDTIME AS NEEDED FOR SLEEP. 90 tablet 1  . acetaminophen (TYLENOL) 500 MG tablet Take 500 mg by mouth every 6  (six) hours as needed for mild pain, moderate pain, fever or headache. Reported on 05/24/2016    . tiotropium (SPIRIVA) 18 MCG inhalation capsule Place 1 capsule (18 mcg total) into inhaler and inhale daily. (Patient not taking: Reported on 05/27/2018) 30 capsule 12  . tizanidine (ZANAFLEX) 2 MG capsule Take 1 capsule (2 mg total) by mouth 3 (three) times daily as needed for muscle spasms. (Patient not taking: Reported on 05/27/2018) 60 capsule 1  . traMADol (ULTRAM) 50 MG tablet Take 1 tablet (50 mg total) by mouth every 6 (six) hours as needed (BACK PAIN). (Patient not taking: Reported on 05/27/2018) 60 tablet 1   No current facility-administered medications for this visit.     SURGICAL HISTORY:  Past Surgical History:  Procedure Laterality Date  . BACK SURGERY     cervical 1991  . EYE SURGERY    . IR GENERIC HISTORICAL  01/16/2017   IR RADIOLOGIST EVAL & MGMT 01/16/2017 [facility]  . IR GENERIC HISTORICAL  01/31/2017   IR FLUORO GUIDED NEEDLE PLC ASPIRATION/INJECTION LOC 01/31/2017 Luanne Bras, MD [facility]  . IR GENERIC HISTORICAL  01/31/2017   IR VERTEBROPLASTY CERV/THOR BX INC UNI/BIL INC/INJECT/IMAGING 01/31/2017 Luanne Bras, MD [facility]  . IR GENERIC HISTORICAL  01/31/2017   IR VERTEBROPLASTY EA ADDL (T&LS) BX INC UNI/BIL INC INJECT/IMAGING 01/31/2017 Luanne Bras, MD [facility]  . kidney cancer    . NEPHRECTOMY    . SPINE SURGERY    . TUBAL LIGATION    . VIDEO ASSISTED THORACOSCOPY (VATS)/WEDGE RESECTION Right 01/18/2016   Procedure: VIDEO ASSISTED THORACOSCOPY (VATS)/LUNG RESECTION, THOROCOTOMY, RIGHT UPPER LOBECTOMY, LYMPH NODE DISSECTION, PLACEMENT OF ON Q;  Surgeon: [name], MD;  Location: [facility];  Service: Thoracic;  Laterality: Right;  Marland Kitchen VIDEO BRONCHOSCOPY Bilateral 09/20/2015   Procedure: VIDEO BRONCHOSCOPY WITHOUT FLUORO;  Surgeon: Rigoberto Noel, MD;  Location: [facility];  Service: Cardiopulmonary;  Laterality: Bilateral;  . VIDEO BRONCHOSCOPY  N/A 01/18/2016   Procedure: VIDEO BRONCHOSCOPY;  Surgeon: [name], MD;  Location: [facility] OR;  Service: Thoracic;  Laterality: N/A;    REVIEW OF SYSTEMS:  A comprehensive review of systems was negative.   PHYSICAL EXAMINATION: General appearance: alert, cooperative and no distress Head: Normocephalic, without obvious abnormality, atraumatic Neck: no adenopathy, no JVD, supple, symmetrical, trachea midline and thyroid not enlarged, symmetric, no tenderness/mass/nodules Lymph nodes: Cervical, supraclavicular, and axillary nodes normal. Resp: clear to auscultation bilaterally Back: symmetric, no curvature. ROM normal. No CVA tenderness. Cardio: regular rate and rhythm, S1, S2 normal, no murmur, click, rub or gallop GI: soft, non-tender; bowel sounds normal; no masses,  no organomegaly Extremities: extremities normal, atraumatic, no cyanosis or edema  ECOG PERFORMANCE STATUS: 1 - Symptomatic but completely ambulatory  Blood pressure (!) 153/79, pulse 79, temperature 98.6 F (37 C), temperature source Oral, resp. rate 18, height 5\' 4"  (1.626 m), weight 170 lb 12.8 oz (77.5 kg), SpO2 98 %.  LABORATORY DATA: Lab Results  Component Value Date  WBC 5.8 05/19/2018   HGB 14.0 05/19/2018   HCT 41.7 05/19/2018   MCV 93 05/19/2018   PLT 304 05/19/2018      Chemistry      Component Value Date/Time   NA 142 05/19/2018 0948   NA 140 11/11/2017 0939   K 5.4 (H) 05/19/2018 0948   K 4.6 11/11/2017 0939   CL 102 05/19/2018 0948   CO2 25 05/19/2018 0948   CO2 28 11/11/2017 0939   BUN 20 05/19/2018 0948   BUN 30.3 (H) 11/11/2017 0939   CREATININE 1.25 (H) 05/19/2018 0948   CREATININE 1.3 (H) 11/11/2017 0939   GLU 105 12/10/2009      Component Value Date/Time   CALCIUM 10.0 05/19/2018 0948   CALCIUM 10.0 11/11/2017 0939   ALKPHOS 88 05/19/2018 0948   ALKPHOS 102 11/11/2017 0939   AST 19 05/19/2018 0948   AST 18 11/11/2017 0939   ALT 17 05/19/2018 0948   ALT 19 11/11/2017 0939    BILITOT 0.4 05/19/2018 0948   BILITOT 0.42 11/11/2017 0939       RADIOGRAPHIC STUDIES: Ct Chest W Contrast  Result Date: 05/12/2018 CLINICAL DATA:  Non-small-cell lung cancer. EXAM: CT CHEST WITH CONTRAST TECHNIQUE: Multidetector CT imaging of the chest was performed during intravenous contrast administration. CONTRAST:  82mL OMNIPAQUE IOHEXOL 300 MG/ML  SOLN COMPARISON:  11/11/2017 FINDINGS: Cardiovascular: The heart size is normal. No substantial pericardial effusion. Coronary artery calcification is evident. Atherosclerotic calcification is noted in the wall of the thoracic aorta. Mediastinum/Nodes: No mediastinal lymphadenopathy. There is no hilar lymphadenopathy. The esophagus has normal imaging features. There is no axillary lymphadenopathy. Lungs/Pleura: Status post right upper lobectomy. Centrilobular and paraseptal emphysema evident. Postsurgical scarring noted in the right parahilar region. Similar appearance of the bandlike opacity in the left posterior costophrenic sulcus. Nodular component to this opacity measured previously at 3.1 cm is stable, measuring 2.9 cm today. Upper Abdomen: Left kidney surgically absent. Right kidney incompletely visualized. Musculoskeletal: Insert bone windows status post augmentation at T3 and T4. Superior endplate compression deformity at T6 is stable. IMPRESSION: 1. Status post right upper lobectomy with scarring in the right parahilar region. Stable in the interval without new or progressive interval findings. 2. Bandlike opacity in the posterior left lower lobe with associated nodular component is unchanged since prior study. Continued attention on follow-up suggested. 3. Upper thoracic vertebral augmentation with stable superior endplate compression deformity at untreated T6 level. 4.  Emphysema. (ICD10-J43.9) 5.  Aortic Atherosclerois (ICD10-170.0) Electronically Signed   By: Misty Stanley M.D.   On: 05/12/2018 13:13    ASSESSMENT AND PLAN:  This is a very  pleasant 77 years old white female with a stage IIIa non-small cell lung cancer, squamous cell carcinoma status post neoadjuvant systemic chemotherapy was carboplatin and paclitaxel with partial response followed by right upper lobectomy and lymph node dissection. She was found to have evidence of metastatic disease to the mediastinal lymph nodes and close resection margin. The patient underwent a course of concurrent chemoradiation with weekly carboplatin and paclitaxel. She was also treated with SBRT left lower lobe pulmonary nodule under the care of Dr. Sondra Come. The patient is currently on observation and she is feeling fine. Repeat CT scan of the chest showed no concerning findings for disease progression. I personally and independently reviewed the scan images and discussed the result and showed the images to the patient today. I recommended for her to continue on observation with repeat CT scan of the chest in 6  months. She was advised to call immediately if she has any concerning symptoms in the interval. The patient voices understanding of current disease status and treatment options and is in agreement with the current care plan. All questions were answered. The patient knows to call the clinic with any problems, questions or concerns. We can certainly see the patient much sooner if necessary. I spent 10 minutes counseling the patient face to face. The total time spent in the appointment was 15 minutes.  Disclaimer: This note was dictated with voice recognition software. Similar sounding words can inadvertently be transcribed and may not be corrected upon review.

## 2018-06-30 ENCOUNTER — Telehealth: Payer: Self-pay

## 2018-06-30 ENCOUNTER — Other Ambulatory Visit: Payer: Self-pay

## 2018-06-30 ENCOUNTER — Telehealth: Payer: Self-pay | Admitting: Family Medicine

## 2018-06-30 MED ORDER — TIOTROPIUM BROMIDE MONOHYDRATE 18 MCG IN CAPS: 18.0000 ug | ORAL_CAPSULE | Freq: Every day | RESPIRATORY_TRACT | 3 refills | Status: DC

## 2018-06-30 NOTE — Telephone Encounter (Signed)
Called pt an advised spiriva  18 mcg inhalation capsules #30 with 3 refills [facility] to [facility] [address].  Apologized for delay and prior refusal of medication.  Pt appreciative. Dgaddy, CMA

## 2018-06-30 NOTE — Telephone Encounter (Signed)
error 

## 2018-08-23 ENCOUNTER — Ambulatory Visit (INDEPENDENT_AMBULATORY_CARE_PROVIDER_SITE_OTHER): Payer: Medicare Other | Admitting: Family Medicine

## 2018-08-23 DIAGNOSIS — Z23 Encounter for immunization: Secondary | ICD-10-CM

## 2018-08-23 NOTE — Progress Notes (Signed)
Flu shot only

## 2018-09-25 DIAGNOSIS — H04123 Dry eye syndrome of bilateral lacrimal glands: Secondary | ICD-10-CM | POA: Diagnosis not present

## 2018-09-25 DIAGNOSIS — H524 Presbyopia: Secondary | ICD-10-CM | POA: Diagnosis not present

## 2018-09-25 DIAGNOSIS — H40013 Open angle with borderline findings, low risk, bilateral: Secondary | ICD-10-CM | POA: Diagnosis not present

## 2018-09-25 DIAGNOSIS — H1859 Other hereditary corneal dystrophies: Secondary | ICD-10-CM | POA: Diagnosis not present

## 2018-10-01 ENCOUNTER — Telehealth: Payer: Self-pay | Admitting: Family Medicine

## 2018-10-01 NOTE — Telephone Encounter (Signed)
Called and spoke with pt regarding their appt with Dr. Nolon Rod on 11/20/18. Due to provider schedule change, we were able to reschedule their appt to 11/21/18 at 9:200. I advised pt of time, building and late policy. Pt acknowledged.

## 2018-11-10 DIAGNOSIS — H40013 Open angle with borderline findings, low risk, bilateral: Secondary | ICD-10-CM | POA: Diagnosis not present

## 2018-11-10 DIAGNOSIS — H04123 Dry eye syndrome of bilateral lacrimal glands: Secondary | ICD-10-CM | POA: Diagnosis not present

## 2018-11-10 DIAGNOSIS — H1859 Other hereditary corneal dystrophies: Secondary | ICD-10-CM | POA: Diagnosis not present

## 2018-11-10 DIAGNOSIS — Z961 Presence of intraocular lens: Secondary | ICD-10-CM | POA: Diagnosis not present

## 2018-11-14 ENCOUNTER — Ambulatory Visit: Payer: Medicare Other

## 2018-11-20 ENCOUNTER — Ambulatory Visit: Payer: Medicare Other | Admitting: Family Medicine

## 2018-11-21 ENCOUNTER — Ambulatory Visit (INDEPENDENT_AMBULATORY_CARE_PROVIDER_SITE_OTHER): Payer: Medicare Other | Admitting: Family Medicine

## 2018-11-21 ENCOUNTER — Encounter: Payer: Self-pay | Admitting: Family Medicine

## 2018-11-21 ENCOUNTER — Other Ambulatory Visit: Payer: Self-pay

## 2018-11-21 VITALS — BP 142/84 | HR 77 | Temp 99.1°F | Resp 17 | Ht 64.0 in | Wt 173.4 lb

## 2018-11-21 DIAGNOSIS — M4316 Spondylolisthesis, lumbar region: Secondary | ICD-10-CM | POA: Diagnosis not present

## 2018-11-21 DIAGNOSIS — E7800 Pure hypercholesterolemia, unspecified: Secondary | ICD-10-CM | POA: Diagnosis not present

## 2018-11-21 DIAGNOSIS — M8000XA Age-related osteoporosis with current pathological fracture, unspecified site, initial encounter for fracture: Secondary | ICD-10-CM | POA: Insufficient documentation

## 2018-11-21 DIAGNOSIS — E663 Overweight: Secondary | ICD-10-CM | POA: Diagnosis not present

## 2018-11-21 DIAGNOSIS — G620 Drug-induced polyneuropathy: Secondary | ICD-10-CM

## 2018-11-21 DIAGNOSIS — M8000XS Age-related osteoporosis with current pathological fracture, unspecified site, sequela: Secondary | ICD-10-CM

## 2018-11-21 DIAGNOSIS — R252 Cramp and spasm: Secondary | ICD-10-CM

## 2018-11-21 DIAGNOSIS — J449 Chronic obstructive pulmonary disease, unspecified: Secondary | ICD-10-CM | POA: Diagnosis not present

## 2018-11-21 DIAGNOSIS — I10 Essential (primary) hypertension: Secondary | ICD-10-CM | POA: Diagnosis not present

## 2018-11-21 DIAGNOSIS — M810 Age-related osteoporosis without current pathological fracture: Secondary | ICD-10-CM

## 2018-11-21 DIAGNOSIS — T451X5A Adverse effect of antineoplastic and immunosuppressive drugs, initial encounter: Secondary | ICD-10-CM

## 2018-11-21 DIAGNOSIS — C3491 Malignant neoplasm of unspecified part of right bronchus or lung: Secondary | ICD-10-CM | POA: Diagnosis not present

## 2018-11-21 MED ORDER — TIZANIDINE HCL 2 MG PO CAPS: 2.0000 mg | ORAL_CAPSULE | Freq: Three times a day (TID) | ORAL | 1 refills | Status: DC | PRN

## 2018-11-21 MED ORDER — ATORVASTATIN CALCIUM 40 MG PO TABS: ORAL_TABLET | ORAL | 1 refills | Status: DC

## 2018-11-21 MED ORDER — TRAMADOL HCL 50 MG PO TABS: 50.0000 mg | ORAL_TABLET | Freq: Four times a day (QID) | ORAL | 0 refills | Status: DC | PRN

## 2018-11-21 MED ORDER — METOPROLOL SUCCINATE ER 50 MG PO TB24: 50.0000 mg | ORAL_TABLET | Freq: Every day | ORAL | 1 refills | Status: DC

## 2018-11-21 MED ORDER — ALENDRONATE SODIUM 70 MG PO TABS: 70.0000 mg | ORAL_TABLET | ORAL | 3 refills | Status: DC

## 2018-11-21 MED ORDER — FLUTICASONE-SALMETEROL 250-50 MCG/DOSE IN AEPB: 1.0000 | INHALATION_SPRAY | Freq: Two times a day (BID) | RESPIRATORY_TRACT | 3 refills | Status: DC

## 2018-11-21 MED ORDER — TRAZODONE HCL 50 MG PO TABS: 25.0000 mg | ORAL_TABLET | Freq: Every evening | ORAL | 3 refills | Status: DC | PRN

## 2018-11-21 NOTE — Assessment & Plan Note (Signed)
Stable on fosamax, sees her Dentist twice a year

## 2018-11-21 NOTE — Assessment & Plan Note (Signed)
benign followed by Dr. Julien Nordmann

## 2018-11-21 NOTE — Assessment & Plan Note (Signed)
Patient's blood pressure is at goal of 139/89 or less. Condition is stable. Continue current medications and treatment plan. I recommend that you exercise for 30-45 minutes 5 days a week. I also recommend a balanced diet with fruits and vegetables every day, lean meats, and little fried foods. The DASH diet (you can find this online) is a good example of this.

## 2018-11-21 NOTE — Assessment & Plan Note (Signed)
Currently on ADVAIR Vaccinations up to date

## 2018-11-21 NOTE — Progress Notes (Signed)
Established Patient Office Visit  Subjective:  Patient ID: Tammy Ball, female    DOB: 1941-08-15  Age: 78 y.o. MRN: [id number]  CC:  Chief Complaint  Patient presents with  . Medical Management of Chronic Issues    f/u    HPI JACKQUELYN SUNDBERG presents for   Patient of Dr. Tamala Julian here to establish care  Has a history of cancer RCC and lung cancer She is followed by Oncology with Julien Nordmann MD She is under surveillance She has chemo induced neuropathy but it is tolerable  COPD GOLD II She denies wheezing  She uses ADVAIR She is a nonsmoker currently She occasionally gets shortness of breath  Musculoskeletal/Osteoporosis Patient is on Fosamax and was having joint pain that resolved She is now tolerating it well She has a history of compression fracture and spondylolisthesis She would like a 90 day supply of the fosamax She has been to her dentist and she does no have any issues with her manible   Hypertension: Patient here for follow-up of elevated blood pressure. She is exercising and is adherent to low salt diet.  Blood pressure is well controlled at home. Cardiac symptoms none. Patient denies chest pain, claudication, dyspnea, exertional chest pressure/discomfort, irregular heart beat and lower extremity edema.  Cardiovascular risk factors: advanced age (older than 62 for men, 87 for women), dyslipidemia and hypertension. Use of agents associated with hypertension: none. History of target organ damage: none. She takes metoprolol 24m She states that her home bp readings have been running a little bit high Her averages 139-140s/60-80s She states that this is an increase in her baseline She states that while she was on chemo she had a few readings that were high and took a half a pill more of the metoprolol    Past Medical History:  Diagnosis Date  . Anemia    was while doing chemo  . Anxiety   . Asthma   . Chemotherapy-induced neuropathy ([facility] 11/01/2015  .  Claustrophobia   . COPD (chronic obstructive pulmonary disease) ([facility]   . Depression   . Encounter for antineoplastic chemotherapy 02/09/2016  . Glaucoma   . Headache    prior to menopause  . Heart murmur   . History of echocardiogram    Echo 2/17: EF 637-34% grade 1 diastolic dysfunction, mild MR, trivial pericardial effusion  . History of nuclear stress test    Myoview 2/17: no ischemia or scar, EF 79%; low risk  . History of radiation therapy 10/30/16-11/06/16  . Hyperlipidemia   . Hypertension   . Insomnia 05/16/2016  . Non-small cell carcinoma of right lung, stage 2 ([facility] 10/03/2015  . Radiation 02/29/16-04/10/16   50.4 Gy to right central chest  . Renal cell carcinoma ([facility]    L nephrectomy  in 2012    Past Surgical History:  Procedure Laterality Date  . BACK SURGERY     cervical 1991  . EYE SURGERY    . IR GENERIC HISTORICAL  01/16/2017   IR RADIOLOGIST EVAL & MGMT 01/16/2017 [facility]  . IR GENERIC HISTORICAL  01/31/2017   IR FLUORO GUIDED NEEDLE PLC ASPIRATION/INJECTION LOC 01/31/2017 SLuanne Bras MD [facility]  . IR GENERIC HISTORICAL  01/31/2017   IR VERTEBROPLASTY CERV/THOR BX INC UNI/BIL INC/INJECT/IMAGING 01/31/2017 SLuanne Bras MD [facility]  . IR GENERIC HISTORICAL  01/31/2017   IR VERTEBROPLASTY EA ADDL (T&LS) BX INC UNI/BIL INC INJECT/IMAGING 01/31/2017 SLuanne Bras MD [facility]  . kidney cancer    .  NEPHRECTOMY    . SPINE SURGERY    . TUBAL LIGATION    . VIDEO ASSISTED THORACOSCOPY (VATS)/WEDGE RESECTION Right 01/18/2016   Procedure: VIDEO ASSISTED THORACOSCOPY (VATS)/LUNG RESECTION, THOROCOTOMY, RIGHT UPPER LOBECTOMY, LYMPH NODE DISSECTION, PLACEMENT OF ON Q;  Surgeon: [name], MD;  Location: [facility];  Service: Thoracic;  Laterality: Right;  Marland Kitchen VIDEO BRONCHOSCOPY Bilateral 09/20/2015   Procedure: VIDEO BRONCHOSCOPY WITHOUT FLUORO;  Surgeon: Rigoberto Noel, MD;  Location: [facility];  Service: Cardiopulmonary;  Laterality: Bilateral;   . VIDEO BRONCHOSCOPY N/A 01/18/2016   Procedure: VIDEO BRONCHOSCOPY;  Surgeon: [name], MD;  Location[facility];  Service: Thoracic;  Laterality: N/A;    Family History  Problem Relation Age of Onset  . Heart disease Sister   . Obesity Brother   . Heart attack Daughter 45       s/p CABG  . Glaucoma Daughter   . Breast cancer Sister   . Birth defects Sister   . Cancer Mother        Bladder Cancer  . Hypertension Mother   . CAD Mother 19  . Cancer Maternal Grandmother     Social History   Socioeconomic History  . Marital status: Widowed    Spouse name: Not on file  . Number of children: 4  . Years of education: Not on file  . Highest education level: High school graduate  Occupational History  . Not on file  Social Needs  . Financial resource strain: Not hard at all  . Food insecurity:    Worry: Never true    Inability: Never true  . Transportation needs:    Medical: No    Non-medical: No  Tobacco Use  . Smoking status: Former Smoker    Packs/day: 0.50    Years: 50.00    Pack years: 25.00    Types: Cigarettes    Last attempt to quit: 03/16/2011    Years since quitting: 7.6  . Smokeless tobacco: Never Used  Substance and Sexual Activity  . Alcohol use: No    Alcohol/week: 0.0 standard drinks  . Drug use: No  . Sexual activity: Not Currently  Lifestyle  . Physical activity:    Days per week: 0 days    Minutes per session: 0 min  . Stress: Only a little  Relationships  . Social connections:    Talks on phone: More than three times a week    Gets together: More than three times a week    Attends religious service: More than 4 times per year    Active member of club or organization: Yes    Attends meetings of clubs or organizations: More than 4 times per year    Relationship status: Widowed  . Intimate partner violence:    Fear of current or ex partner: No    Emotionally abused: No    Physically abused: No    Forced sexual activity: No  Other Topics  Concern  . Not on file  Social History Narrative   Marital status: divorced; not dating in 2019.      Children: 4 children; 3 grandchildren adult; 4 gg.      Lives: alone in house      Employment: part-time work; substance abuse counselor; [employer].      Tobacco: quit smoking 2012; smoked 45 years      Alcohol: none      Drugs: none      Exercise: none in 2019; due to  LEFT sciatica.      ADLs: independent with ADLs; drives.       Advanced Directives: YES: HCPOA: Nicholas Martinez/son.  FULL CODE but no prolonged measures.      Occupation: Substance Abuse Estate agent   No exercise** Merged History Encounter **       ** Data from: 12/14/11 Enc Dept: [facility]       ** Data from: 12/17/11 Enc Dept: [facility]   Substance abuse counselor   Husband deceased   4 great grandchildren   Son works in same substance abuse counseling center as patient    Outpatient Medications Prior to Visit  Medication Sig Dispense Refill  . acetaminophen (TYLENOL) 500 MG tablet Take 500 mg by mouth every 6 (six) hours as needed for mild pain, moderate pain, fever or headache. Reported on 05/24/2016    . alendronate (FOSAMAX) 70 MG tablet Take 1 tablet (70 mg total) by mouth every 7 (seven) days. Take with a full glass of water on an empty stomach. 4 tablet 11  . Ascorbic Acid (VITAMIN C) 1000 MG tablet Take 1,000 mg by mouth daily.    Marland Kitchen aspirin EC 81 MG tablet Take 81 mg by mouth daily. Reported on 04/11/2016    . atorvastatin (LIPITOR) 40 MG tablet TAKE ONE TABLET BY MOUTH ONCE DAILY. 90 tablet 3  . calcium carbonate (TUMS EX) 750 MG chewable tablet Chew 1 tablet by mouth as needed for heartburn.    . Cholecalciferol (VITAMIN D3 PO) Take 1,200 mg by mouth daily.    . fish oil-omega-3 fatty acids 1000 MG capsule Take 1 capsule by mouth daily.     . Fluticasone-Salmeterol (ADVAIR) 250-50 MCG/DOSE AEPB Inhale 1 puff into the lungs 2 (two) times daily. 60  each 11  . metoprolol succinate (TOPROL-XL) 50 MG 24 hr tablet Take 1 tablet (50 mg total) by mouth daily. Take with or immediately following a meal. 90 tablet 1  . tiotropium (SPIRIVA) 18 MCG inhalation capsule Place 1 capsule (18 mcg total) into inhaler and inhale daily. 30 capsule 3  . tizanidine (ZANAFLEX) 2 MG capsule Take 1 capsule (2 mg total) by mouth 3 (three) times daily as needed for muscle spasms. 60 capsule 1  . traMADol (ULTRAM) 50 MG tablet Take 1 tablet (50 mg total) by mouth every 6 (six) hours as needed (BACK PAIN). 60 tablet 1  . traZODone (DESYREL) 50 MG tablet TAKE 0.5-1 TABLETS (25-50 MG TOTAL) BY MOUTH AT BEDTIME AS NEEDED FOR SLEEP. 90 tablet 1   No facility-administered medications prior to visit.     Allergies  Allergen Reactions  . Bee Venom Anaphylaxis, Shortness Of Breath, Swelling and Other (See Comments)    Swelling at site   . Amlodipine Swelling and Other (See Comments)    Swelling of the ankles and hands   . Fosamax [Alendronate Sodium] Swelling    Joint pain  . Levofloxacin Other (See Comments)    Joint pain   . Hctz [Hydrochlorothiazide] Palpitations and Other (See Comments)    Sweating     ROS Review of Systems Review of Systems  Constitutional: Negative for activity change, appetite change, chills and fever.  HENT: Negative for congestion, nosebleeds, trouble swallowing and voice change.   Respiratory: Negative for cough, shortness of breath and wheezing.   Gastrointestinal: Negative for diarrhea, nausea and vomiting.  Genitourinary: Negative for difficulty urinating, dysuria, flank pain and hematuria.  Musculoskeletal:  Negative for back pain, joint swelling and neck pain.  Neurological: Negative for dizziness, speech difficulty, light-headedness and numbness.  See HPI. All other review of systems negative.     Objective:    Physical Exam  BP (!) 142/84 (BP Location: Left Arm, Patient Position: Sitting, Cuff Size: Large)   Pulse 77    Temp 99.1 F (37.3 C) (Oral)   Resp 17   Ht _0  (1.626 m)   Wt 173 lb 6.4 oz (78.7 kg)   SpO2 94%   BMI 29.76 kg/m  Wt Readings from Last 3 Encounters:  11/21/18 173 lb 6.4 oz (78.7 kg)  05/27/18 170 lb 12.8 oz (77.5 kg)  05/19/18 167 lb 3.2 oz (75.8 kg)   Physical Exam  Constitutional: Oriented to person, place, and time. Appears well-developed and well-nourished.  HENT:  Head: Normocephalic and atraumatic.  Eyes: Conjunctivae and EOM are normal.  Cardiovascular: Normal rate, regular rhythm, normal heart sounds and intact distal pulses.  No murmur heard. Pulmonary/Chest: Effort normal and breath sounds normal. No stridor. No respiratory distress. Has no wheezes.  Neurological: Is alert and oriented to person, place, and time.  Skin: Skin is warm. Capillary refill takes less than 2 seconds.  Psychiatric: Has a normal mood and affect. Behavior is normal. Judgment and thought content normal.    There are no preventive care reminders to display for this patient.  There are no preventive care reminders to display for this patient.  Lab Results  Component Value Date   TSH 1.88 10/02/2016   Lab Results  Component Value Date   WBC 5.8 05/19/2018   HGB 14.0 05/19/2018   HCT 41.7 05/19/2018   MCV 93 05/19/2018   PLT 304 05/19/2018   Lab Results  Component Value Date   NA 142 05/19/2018   K 5.4 (H) 05/19/2018   CHLORIDE 103 11/11/2017   CO2 25 05/19/2018   GLUCOSE 109 (H) 05/19/2018   BUN 20 05/19/2018   CREATININE 1.25 (H) 05/19/2018   BILITOT 0.4 05/19/2018   ALKPHOS 88 05/19/2018   AST 19 05/19/2018   ALT 17 05/19/2018   PROT 7.2 05/19/2018   ALBUMIN 4.6 05/19/2018   CALCIUM 10.0 05/19/2018   ANIONGAP 7 05/12/2018   EGFR 40 (L) 11/11/2017   Lab Results  Component Value Date   CHOL 183 05/19/2018   Lab Results  Component Value Date   HDL 83 05/19/2018   Lab Results  Component Value Date   LDLCALC 85 05/19/2018   Lab Results  Component Value Date    TRIG 76 05/19/2018   Lab Results  Component Value Date   CHOLHDL 2.2 05/19/2018   No results found for: HGBA1C    Assessment & Plan:   Problem List Items Addressed This Visit      Cardiovascular and Mediastinum   Essential hypertension - Primary Patient's blood pressure is at goal of 139/89 or less. Condition is stable. Continue current medications and treatment plan. I recommend that you exercise for 30-45 minutes 5 days a week. I also recommend a balanced diet with fruits and vegetables every day, lean meats, and little fried foods. The DASH diet (you can find this online) is a good example of this.    Relevant Orders   Lipid panel   CMP14+EGFR     Respiratory   COPD GOLD II    Non-small cell carcinoma of right lung, stage 2 (HCC) Discussed lung cancer mgmt     Nervous and Auditory  Chemotherapy-induced neuropathy (HCC)     Musculoskeletal and Integument   Spondylolisthesis of lumbar region     Other   Hyperlipidemia  Discussed medications that affect lipids Reminded patient to avoid grapefruits Reviewed last 3 lipids Discussed current meds: statin, aspirin Advised dietary fiber and fish oil and ways to keep HDL high CAD prevention and reviewed side effects of statins    Relevant Orders   Lipid panel   TSH   CMP14+EGFR    Other Visit Diagnoses    Muscle cramps    -  Will check for deficiency   Relevant Orders   CMP14+EGFR   Overweight    -  Will check for metabolic issues   Relevant Orders   Lipid panel   TSH      No orders of the defined types were placed in this encounter.   Follow-up: Return in about 6 months (around 05/22/2019) for cholesterol and blood pressure check.    Forrest Moron, MD

## 2018-11-21 NOTE — Patient Instructions (Addendum)
If you have lab work done today you will be contacted with your lab results within the next 2 weeks.  If you have not heard from [place] then please contact us. The fastest way to get your results is to register for My Chart.   IF you received an x-ray today, you will receive an invoice from [facility]. Please contact [facility] at [phone] with questions or concerns regarding your invoice.   IF you received labwork today, you will receive an invoice from [place]. Please contact [facility] at [phone] with questions or concerns regarding your invoice.   Our billing staff will not be able to assist you with questions regarding bills from these companies.  You will be contacted with the lab results as soon as they are available. The fastest way to get your results is to activate your My Chart account. Instructions are located on the last page of this paperwork. If you have not heard from [place] regarding the results in 2 weeks, please contact this office.     Heart Disease Prevention Heart disease is the leading cause of death in the world. Coronary artery disease is the most common cause of heart disease. This condition results when cholesterol and other substances (plaque) build up inside the walls of the blood vessels that supply your heart muscle (arteries). This buildup in arteries is called atherosclerosis. You can take actions to lower your risk of heart disease. How can heart disease affect me? Heart disease can cause many unpleasant symptoms and complications, such as:  Chest pain (angina).  Reduced or blocked blood flow to your heart. This can cause: ? Irregular heartbeats (arrhythmias). ? Heart attack. ? Heart failure. What can increase my risk? The following factors may make you more likely to develop this condition:  High blood pressure (hypertension).  High cholesterol.  Smoking.  A diet high in saturated fats or trans fats.  Lack of physical  activity.  Obesity.  Drinking too much alcohol.  Diabetes.  Having a family history of heart disease. What actions can I take to prevent heart disease? Nutrition   Eat a heart-healthy eating plan as told by your health care provider. Examples include the DASH (Dietary Approaches to Stop Hypertension) eating plan or the Mediterranean diet.  Generally, it is recommended that you: ? Eat less salt (sodium). Ask your health care provider how much sodium is safe for you. Most people should have less than 2,300 mg each day. ? Limit unhealthy fats, such as saturated and trans fats, in your diet. You can do this by eating low-fat dairy products, eating less red meat, and avoiding processed foods. ? Eat healthy fats (omega-3 fatty acids). These are found in fish, such as mackerel or salmon. ? Eat more fruits and vegetables. You should try to fill one-half of your plate with fruits and vegetables at each meal. ? Eat more whole grains. ? Avoid foods and drinks that have added sugars. Lifestyle   Get regular exercise. This is one of the most important things you can do for your health. Generally, it is recommended that you: ? Exercise for at least 30 minutes on most days of the week (150 minutes each week). The exercise should increase your heart rate and make you sweat (aerobic exercise). ? Add strength exercises on at least 2 days each week.  Do not use any products that contain nicotine or tobacco, such as cigarettes and e-cigarettes. These can damage your heart and blood vessels. If  you need help quitting, ask your health care provider. Alcohol use  Do not drink alcohol if: ? Your health care provider tells you not to drink. ? You are pregnant, may be pregnant, or are planning to become pregnant.  If you drink alcohol, limit how much you have: ? 0-1 drink a day for women. ? 0-2 drinks a day for men.  Be aware of how much alcohol is in your drink. In the [place] one drink equals one  typical bottle of beer (12 oz), one-half glass of wine (5 oz), or one shot of hard liquor (1 oz). Medicines  Take over-the-counter and prescription medicines only as told by your health care provider.  Ask your health care provider whether you should take an aspirin every day. Taking aspirin may help reduce your risk of heart disease and stroke.  Depending on your risk factors, your health care provider may prescribe medicines to lower your risk of heart disease or to control related conditions. You may take medicine to: ? Lower cholesterol. ? Control blood pressure. ? Control diabetes. General information  Keep your blood pressure under control, as recommended by your health care provider. For most healthy people, the upper number of your blood pressure (systolic) should be no higher than 120, and the lower number (diastolic) no higher than 80. Treatment may be needed if your blood pressure is higher than 130/80.  Have your blood pressure checked at least every two years. Your health care provider may check your blood pressure more often if you have high blood pressure.  After age 70, have your cholesterol checked every 4-6 years. If you have risk factors for heart disease, you may need to have it checked more frequently. Treatment may be needed if your cholesterol is high.  Have your body mass index (BMI) checked every year. Your health care provider can calculate your BMI from your height and weight.  Work with your health care provider to lose weight, if needed, or to maintain a healthy weight. Where to find more information:  Centers for Disease Control and Prevention: TextNotebook.fi  American Heart Association: www.heart.org ? Take a free online heart disease risk quiz to better understand your personal risk factors. Summary  Heart disease is the leading cause of death in the world.  Heart disease can cause chest pain, abnormal heart rhythms, heart attack, and heart  failure.  High blood pressure, high cholesterol, and smoking are the main risk factors for heart disease, although other factors also contribute.  You can take actions to lower your chances of developing heart disease. Work with your health care provider to reduce your risk by following a heart-healthy diet, being physically active, and controlling your weight, blood pressure, and cholesterol level. This information is not intended to replace advice given to you by your health care provider. Make sure you discuss any questions you have with your health care provider. Document Released: 06/12/2004 Document Revised: 11/13/2017 Document Reviewed: 11/13/2017 Elsevier Interactive Patient Education  2019 [employer].

## 2018-11-22 LAB — LIPID PANEL
Chol/HDL Ratio: 1.9 ratio (ref 0.0–4.4)
Cholesterol, Total: 163 mg/dL (ref 100–199)
HDL: 87 mg/dL (ref >39)
LDL Calculated: 61 mg/dL (ref 0–99)
Triglycerides: 73 mg/dL (ref 0–149)
VLDL Cholesterol Cal: 15 mg/dL (ref 5–40)

## 2018-11-22 LAB — CMP14+EGFR
ALT: 21 IU/L (ref 0–32)
AST: 22 IU/L (ref 0–40)
Albumin/Globulin Ratio: 2.2 (ref 1.2–2.2)
Albumin: 4.6 g/dL (ref 3.5–4.8)
Alkaline Phosphatase: 78 IU/L (ref 39–117)
BUN/Creatinine Ratio: 22 (ref 12–28)
BUN: 23 mg/dL (ref 8–27)
Bilirubin Total: 0.6 mg/dL (ref 0.0–1.2)
CO2: 22 mmol/L (ref 20–29)
Calcium: 9.8 mg/dL (ref 8.7–10.3)
Chloride: 100 mmol/L (ref 96–106)
Creatinine, Ser: 1.06 mg/dL — ABNORMAL HIGH (ref 0.57–1.00)
GFR calc Af Amer: 59 mL/min/{1.73_m2} — ABNORMAL LOW (ref >59)
GFR calc non Af Amer: 51 mL/min/{1.73_m2} — ABNORMAL LOW (ref >59)
Globulin, Total: 2.1 g/dL (ref 1.5–4.5)
Glucose: 100 mg/dL — ABNORMAL HIGH (ref 65–99)
Potassium: 4.8 mmol/L (ref 3.5–5.2)
Sodium: 140 mmol/L (ref 134–144)
Total Protein: 6.7 g/dL (ref 6.0–8.5)

## 2018-11-22 LAB — TSH: TSH: 1.32 u[IU]/mL (ref 0.450–4.500)

## 2018-11-25 ENCOUNTER — Other Ambulatory Visit: Payer: Self-pay | Admitting: Internal Medicine

## 2018-11-25 ENCOUNTER — Inpatient Hospital Stay: Payer: Medicare Other | Attending: Internal Medicine

## 2018-11-25 ENCOUNTER — Ambulatory Visit (HOSPITAL_COMMUNITY)
Admission: RE | Admit: 2018-11-25 | Discharge: 2018-11-25 | Disposition: A | Discharge status: Home / Self Care | Payer: Medicare Other | Source: Ambulatory Visit | Attending: Internal Medicine | Admitting: Internal Medicine

## 2018-11-25 DIAGNOSIS — C3490 Malignant neoplasm of unspecified part of unspecified bronchus or lung: Secondary | ICD-10-CM | POA: Diagnosis not present

## 2018-11-25 DIAGNOSIS — E785 Hyperlipidemia, unspecified: Secondary | ICD-10-CM | POA: Diagnosis not present

## 2018-11-25 DIAGNOSIS — J449 Chronic obstructive pulmonary disease, unspecified: Secondary | ICD-10-CM | POA: Insufficient documentation

## 2018-11-25 DIAGNOSIS — R011 Cardiac murmur, unspecified: Secondary | ICD-10-CM | POA: Diagnosis not present

## 2018-11-25 DIAGNOSIS — Z85528 Personal history of other malignant neoplasm of kidney: Secondary | ICD-10-CM | POA: Diagnosis not present

## 2018-11-25 DIAGNOSIS — Z79899 Other long term (current) drug therapy: Secondary | ICD-10-CM | POA: Insufficient documentation

## 2018-11-25 DIAGNOSIS — G629 Polyneuropathy, unspecified: Secondary | ICD-10-CM | POA: Diagnosis not present

## 2018-11-25 DIAGNOSIS — F419 Anxiety disorder, unspecified: Secondary | ICD-10-CM | POA: Diagnosis not present

## 2018-11-25 DIAGNOSIS — I10 Essential (primary) hypertension: Secondary | ICD-10-CM | POA: Insufficient documentation

## 2018-11-25 DIAGNOSIS — G4730 Sleep apnea, unspecified: Secondary | ICD-10-CM | POA: Diagnosis not present

## 2018-11-25 DIAGNOSIS — C3411 Malignant neoplasm of upper lobe, right bronchus or lung: Secondary | ICD-10-CM | POA: Insufficient documentation

## 2018-11-25 DIAGNOSIS — Z7982 Long term (current) use of aspirin: Secondary | ICD-10-CM | POA: Insufficient documentation

## 2018-11-25 DIAGNOSIS — Z85118 Personal history of other malignant neoplasm of bronchus and lung: Secondary | ICD-10-CM | POA: Diagnosis not present

## 2018-11-25 LAB — CMP (CANCER CENTER ONLY)
ALT: 25 U/L (ref 0–44)
AST: 26 U/L (ref 15–41)
Albumin: 4 g/dL (ref 3.5–5.0)
Alkaline Phosphatase: 74 U/L (ref 38–126)
Anion gap: 9 (ref 5–15)
BUN: 20 mg/dL (ref 8–23)
CO2: 29 mmol/L (ref 22–32)
Calcium: 10 mg/dL (ref 8.9–10.3)
Chloride: 105 mmol/L (ref 98–111)
Creatinine: 1.2 mg/dL — ABNORMAL HIGH (ref 0.44–1.00)
GFR, Est AFR Am: 51 mL/min — ABNORMAL LOW (ref >60)
GFR, Estimated: 44 mL/min — ABNORMAL LOW (ref >60)
Glucose, Bld: 106 mg/dL — ABNORMAL HIGH (ref 70–99)
Potassium: 5.3 mmol/L — ABNORMAL HIGH (ref 3.5–5.1)
Sodium: 143 mmol/L (ref 135–145)
Total Bilirubin: 0.7 mg/dL (ref 0.3–1.2)
Total Protein: 7.1 g/dL (ref 6.5–8.1)

## 2018-11-25 LAB — CBC WITH DIFFERENTIAL (CANCER CENTER ONLY)
Abs Immature Granulocytes: 0.03 10*3/uL (ref 0.00–0.07)
Basophils Absolute: 0.1 10*3/uL (ref 0.0–0.1)
Basophils Relative: 1 %
Eosinophils Absolute: 0.2 10*3/uL (ref 0.0–0.5)
Eosinophils Relative: 3 %
HCT: 43.8 % (ref 36.0–46.0)
Hemoglobin: 14.1 g/dL (ref 12.0–15.0)
Immature Granulocytes: 0 %
Lymphocytes Relative: 29 %
Lymphs Abs: 2 10*3/uL (ref 0.7–4.0)
MCH: 30.9 pg (ref 26.0–34.0)
MCHC: 32.2 g/dL (ref 30.0–36.0)
MCV: 96.1 fL (ref 80.0–100.0)
Monocytes Absolute: 0.7 10*3/uL (ref 0.1–1.0)
Monocytes Relative: 11 %
Neutro Abs: 3.8 10*3/uL (ref 1.7–7.7)
Neutrophils Relative %: 56 %
Platelet Count: 259 10*3/uL (ref 150–400)
RBC: 4.56 MIL/uL (ref 3.87–5.11)
RDW: 15.6 % — ABNORMAL HIGH (ref 11.5–15.5)
WBC Count: 6.8 10*3/uL (ref 4.0–10.5)
nRBC: 0 % (ref 0.0–0.2)

## 2018-11-25 MED ORDER — SODIUM CHLORIDE (PF) 0.9 % IJ SOLN
INTRAMUSCULAR | Status: AC
  Filled 2018-11-25: qty 50

## 2018-11-26 ENCOUNTER — Ambulatory Visit (INDEPENDENT_AMBULATORY_CARE_PROVIDER_SITE_OTHER): Payer: Medicare Other | Admitting: Family Medicine

## 2018-11-26 ENCOUNTER — Ambulatory Visit: Payer: Medicare Other

## 2018-11-26 ENCOUNTER — Encounter: Payer: Self-pay | Admitting: *Deleted

## 2018-11-26 VITALS — BP 118/75 | HR 82 | Ht 63.5 in | Wt 177.6 lb

## 2018-11-26 DIAGNOSIS — Z0000 Encounter for general adult medical examination without abnormal findings: Secondary | ICD-10-CM | POA: Diagnosis not present

## 2018-11-26 NOTE — Patient Instructions (Signed)
Healthy Eating Following a healthy eating pattern may help you to achieve and maintain a healthy body weight, reduce the risk of chronic disease, and live a long and productive life. It is important to follow a healthy eating pattern at an appropriate calorie level for your body. Your nutritional needs should be met primarily through food by choosing a variety of nutrient-rich foods. What are tips for following this plan? Reading food labels  Read labels and choose the following: ? Reduced or low sodium. ? Juices with 100% fruit juice. ? Foods with low saturated fats and high polyunsaturated and monounsaturated fats. ? Foods with whole grains, such as whole wheat, cracked wheat, brown rice, and wild rice. ? Whole grains that are fortified with folic acid. This is recommended for women who are pregnant or who want to become pregnant.  Read labels and avoid the following: ? Foods with a lot of added sugars. These include foods that contain brown sugar, corn sweetener, corn syrup, dextrose, fructose, glucose, high-fructose corn syrup, honey, invert sugar, lactose, malt syrup, maltose, molasses, raw sugar, sucrose, trehalose, or turbinado sugar.  Do not eat more than the following amounts of added sugar per day:  6 teaspoons (25 g) for women.  9 teaspoons (38 g) for men. ? Foods that contain processed or refined starches and grains. ? Refined grain products, such as white flour, degermed cornmeal, white bread, and white rice. Shopping  Choose nutrient-rich snacks, such as vegetables, whole fruits, and nuts. Avoid high-calorie and high-sugar snacks, such as potato chips, fruit snacks, and candy.  Use oil-based dressings and spreads on foods instead of solid fats such as butter, stick margarine, or cream cheese.  Limit pre-made sauces, mixes, and "instant" products such as flavored rice, instant noodles, and ready-made pasta.  Try more plant-protein sources, such as tofu, tempeh, black beans,  edamame, lentils, nuts, and seeds.  Explore eating plans such as the Mediterranean diet or vegetarian diet. Cooking  Use oil to saut or stir-fry foods instead of solid fats such as butter, stick margarine, or lard.  Try baking, boiling, grilling, or broiling instead of frying.  Remove the fatty part of meats before cooking.  Steam vegetables in water or broth. Meal planning   At meals, imagine dividing your plate into fourths: ? One-half of your plate is fruits and vegetables. ? One-fourth of your plate is whole grains. ? One-fourth of your plate is protein, especially lean meats, poultry, eggs, tofu, beans, or nuts.  Include low-fat dairy as part of your daily diet. Lifestyle  Choose healthy options in all settings, including home, work, school, restaurants, or stores.  Prepare your food safely: ? Wash your hands after handling raw meats. ? Keep food preparation surfaces clean by regularly washing with hot, soapy water. ? Keep raw meats separate from ready-to-eat foods, such as fruits and vegetables. ? Cook seafood, meat, poultry, and eggs to the recommended internal temperature. ? Store foods at safe temperatures. In general:  Keep cold foods at 59F (4.4C) or below.  Keep hot foods at 159F (60C) or above.  Keep your freezer at [facility] (-17.8C) or below.  Foods are no longer safe to eat when they have been between the temperatures of 40-159F (4.4-60C) for more than 2 hours. What foods should I eat? Fruits Aim to eat 2 cup-equivalents of fresh, canned (in natural juice), or frozen fruits each day. Examples of 1 cup-equivalent of fruit include 1 small apple, 8 large strawberries, 1 cup canned fruit,  cup  dried fruit, or 1 cup 100% juice. Vegetables Aim to eat 2-3 cup-equivalents of fresh and frozen vegetables each day, including different varieties and colors. Examples of 1 cup-equivalent of vegetables include 2 medium carrots, 2 cups raw, leafy greens, 1 cup chopped  vegetable (raw or cooked), or 1 medium baked potato. Grains Aim to eat 6 ounce-equivalents of whole grains each day. Examples of 1 ounce-equivalent of grains include 1 slice of bread, 1 cup ready-to-eat cereal, 3 cups popcorn, or  cup cooked rice, pasta, or cereal. Meats and other proteins Aim to eat 5-6 ounce-equivalents of protein each day. Examples of 1 ounce-equivalent of protein include 1 egg, 1/2 cup nuts or seeds, or 1 tablespoon (16 g) peanut butter. A cut of meat or fish that is the size of a deck of cards is about 3-4 ounce-equivalents.  Of the protein you eat each week, try to have at least 8 ounces come from seafood. This includes salmon, trout, herring, and anchovies. Dairy Aim to eat 3 cup-equivalents of fat-free or low-fat dairy each day. Examples of 1 cup-equivalent of dairy include 1 cup (240 mL) milk, 8 ounces (250 g) yogurt, 1 ounces (44 g) natural cheese, or 1 cup (240 mL) fortified soy milk. Fats and oils  Aim for about 5 teaspoons (21 g) per day. Choose monounsaturated fats, such as canola and olive oils, avocados, peanut butter, and most nuts, or polyunsaturated fats, such as sunflower, corn, and soybean oils, walnuts, pine nuts, sesame seeds, sunflower seeds, and flaxseed. Beverages  Aim for six 8-oz glasses of water per day. Limit coffee to three to five 8-oz cups per day.  Limit caffeinated beverages that have added calories, such as soda and energy drinks.  Limit alcohol intake to no more than 1 drink a day for nonpregnant women and 2 drinks a day for men. One drink equals 12 oz of beer (355 mL), 5 oz of wine (148 mL), or 1 oz of hard liquor (44 mL). Seasoning and other foods  Avoid adding excess amounts of salt to your foods. Try flavoring foods with herbs and spices instead of salt.  Avoid adding sugar to foods.  Try using oil-based dressings, sauces, and spreads instead of solid fats. This information is based on general [place] nutrition guidelines. For more  information, visit [employer]. Exact amounts may vary based on your nutrition needs. Summary  A healthy eating plan may help you to maintain a healthy weight, reduce the risk of chronic diseases, and stay active throughout your life.  Plan your meals. Make sure you eat the right portions of a variety of nutrient-rich foods.  Try baking, boiling, grilling, or broiling instead of frying.  Choose healthy options in all settings, including home, work, school, restaurants, or stores. This information is not intended to replace advice given to you by your health care provider. Make sure you discuss any questions you have with your health care provider. Document Released: 02/10/2018 Document Revised: 02/10/2018 Document Reviewed: 02/10/2018 Elsevier Interactive Patient Education  2019 [employer].

## 2018-11-26 NOTE — Progress Notes (Signed)
Presents today for [employer] Visit   Interpreter used for this visit? NO  Patient Care Team: Forrest Moron, MD as PCP - General (Internal Medicine) Curt Bears, MD as Consulting Physician (Oncology) [name], MD as Consulting Physician (Cardiothoracic Surgery)   Cancer Screening: Follow ups with oncology     Other Screening:    ADVANCE DIRECTIVES: Discussed: No Will bring coppy   Immunization status:  Immunization History  Administered Date(s) Administered  . Hepatitis B 11/12/1998  . Influenza Split 09/12/2012  . Influenza, High Dose Seasonal PF 08/23/2017, 08/23/2018  . Influenza,inj,Quad PF,6+ Mos 07/18/2013  . Influenza-Unspecified 07/29/2014, 07/23/2015, 08/25/2016  . Pneumococcal Conjugate-13 07/29/2015  . Pneumococcal Polysaccharide-23 11/12/2000, 10/23/2012, 08/23/2017  . Pneumococcal-Unspecified 10/23/2012, 08/23/2017  . Tdap 12/02/2013  . Zoster 12/13/2010     There are no preventive care reminders to display for this patient.   Functional Status Survey:     6CIT Screen 11/26/2018 11/13/2017  What Year? 0 points 0 points  What month? 0 points 0 points  What time? 0 points 0 points  Count back from 20 0 points 0 points  Months in reverse 0 points 0 points  Repeat phrase 0 points 0 points  Total Score 0 0        Clinical Support from 11/26/2018 in Primary Care at [facility]  AUDIT-C Score  0       Home Environment: Free of rugs, hand rails on stairs.   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  States feeling the best she has in years.       Patient Active Problem List   Diagnosis Date Noted  . Age-related osteoporosis with current pathological fracture 11/21/2018  . Renal insufficiency 05/20/2018  . History of compression fracture of spine 12/10/2016  . Hypercalcemia 06/25/2016  . Insomnia 05/16/2016  . Pneumonitis, radiation ([place]) 04/11/2016  . S/P lobectomy of lung 01/18/2016  .  Chemotherapy-induced neuropathy ([place]) 11/01/2015  . Non-small cell carcinoma of right lung, stage 2 ([place]) 10/03/2015  . Morbid obesity ([place]) 10/01/2015  . Hyperlipidemia 08/14/2013  . Spondylolisthesis of lumbar region 07/03/2013  . Glaucoma 10/23/2012  . Essential hypertension 12/17/2011  . COPD GOLD II  12/17/2011  . Renal cell cancer ([place]) 12/17/2011  . Sciatica of left side 12/17/2011  . Depression 12/17/2011     Past Medical History:  Diagnosis Date  . Anemia    was while doing chemo  . Anxiety   . Asthma   . Chemotherapy-induced neuropathy ([place]) 11/01/2015  . Claustrophobia   . COPD (chronic obstructive pulmonary disease) ([place])   . Depression   . Encounter for antineoplastic chemotherapy 02/09/2016  . Glaucoma   . Headache    prior to menopause  . Heart murmur   . History of echocardiogram    Echo 2/17: EF 46-56%, grade 1 diastolic dysfunction, mild MR, trivial pericardial effusion  . History of nuclear stress test    Myoview 2/17: no ischemia or scar, EF 79%; low risk  . History of radiation therapy 10/30/16-11/06/16  . Hyperlipidemia   . Hypertension   . Insomnia 05/16/2016  . Non-small cell carcinoma of right lung, stage 2 ([place]) 10/03/2015  . Radiation 02/29/16-04/10/16   50.4 Gy to right central chest  . Renal cell carcinoma ([place])    L nephrectomy  in 2012     Past Surgical History:  Procedure Laterality Date  . BACK SURGERY     cervical 1991  . EYE SURGERY    .  IR GENERIC HISTORICAL  01/16/2017   IR RADIOLOGIST EVAL & MGMT 01/16/2017 [facility]  . IR GENERIC HISTORICAL  01/31/2017   IR FLUORO GUIDED NEEDLE PLC ASPIRATION/INJECTION LOC 01/31/2017 Luanne Bras, MD [facility]  . IR GENERIC HISTORICAL  01/31/2017   IR VERTEBROPLASTY CERV/THOR BX INC UNI/BIL INC/INJECT/IMAGING 01/31/2017 Luanne Bras, MD [facility]  . IR GENERIC HISTORICAL  01/31/2017   IR VERTEBROPLASTY EA ADDL (T&LS) BX INC UNI/BIL INC INJECT/IMAGING 01/31/2017 Luanne Bras,  MD [facility]  . kidney cancer    . NEPHRECTOMY    . SPINE SURGERY    . TUBAL LIGATION    . VIDEO ASSISTED THORACOSCOPY (VATS)/WEDGE RESECTION Right 01/18/2016   Procedure: VIDEO ASSISTED THORACOSCOPY (VATS)/LUNG RESECTION, THOROCOTOMY, RIGHT UPPER LOBECTOMY, LYMPH NODE DISSECTION, PLACEMENT OF ON Q;  Surgeon: [name], MD;  Location: [place];  Service: Thoracic;  Laterality: Right;  Marland Kitchen VIDEO BRONCHOSCOPY Bilateral 09/20/2015   Procedure: VIDEO BRONCHOSCOPY WITHOUT FLUORO;  Surgeon: Rigoberto Noel, MD;  Location: [facility];  Service: Cardiopulmonary;  Laterality: Bilateral;  . VIDEO BRONCHOSCOPY N/A 01/18/2016   Procedure: VIDEO BRONCHOSCOPY;  Surgeon: [name], MD;  Location: [facility] OR;  Service: Thoracic;  Laterality: N/A;     Family History  Problem Relation Age of Onset  . Heart disease Sister   . Obesity Brother   . Heart attack Daughter 28       s/p CABG  . Glaucoma Daughter   . Breast cancer Sister   . Birth defects Sister   . Cancer Mother        Bladder Cancer  . Hypertension Mother   . CAD Mother 31  . Cancer Maternal Grandmother      Social History   Socioeconomic History  . Marital status: Widowed    Spouse name: Not on file  . Number of children: 4  . Years of education: Not on file  . Highest education level: High school graduate  Occupational History  . Not on file  Social Needs  . Financial resource strain: Not hard at all  . Food insecurity:    Worry: Never true    Inability: Never true  . Transportation needs:    Medical: No    Non-medical: No  Tobacco Use  . Smoking status: Former Smoker    Packs/day: 0.50    Years: 50.00    Pack years: 25.00    Types: Cigarettes    Last attempt to quit: 03/16/2011    Years since quitting: 7.7  . Smokeless tobacco: Never Used  Substance and Sexual Activity  . Alcohol use: No    Alcohol/week: 0.0 standard drinks  . Drug use: No  . Sexual activity: Not Currently  Lifestyle  . Physical activity:     Days per week: 0 days    Minutes per session: 0 min  . Stress: Only a little  Relationships  . Social connections:    Talks on phone: More than three times a week    Gets together: More than three times a week    Attends religious service: More than 4 times per year    Active member of club or organization: Yes    Attends meetings of clubs or organizations: More than 4 times per year    Relationship status: Widowed  . Intimate partner violence:    Fear of current or ex partner: No    Emotionally abused: No    Physically abused: No    Forced sexual activity: No  Other Topics Concern  . Not on file  Social History Narrative   Marital status: divorced; not dating in 2019.      Children: 4 children; 3 grandchildren adult; 4 gg.      Lives: alone in house      Employment: part-time work; substance abuse counselor; [employer].      Tobacco: quit smoking 2012; smoked 45 years      Alcohol: none      Drugs: none      Exercise: none in 2019; due to LEFT sciatica.      ADLs: independent with ADLs; drives.       Advanced Directives: YES: HCPOA: Nicholas Martinez/son.  FULL CODE but no prolonged measures.      Occupation: Substance Abuse Estate agent   No exercise** Merged History Encounter **       ** Data from: 12/14/11 Enc Dept: [facility]       ** Data from: 12/17/11 Enc Dept: [facility]   Substance abuse counselor   Husband deceased   4 great grandchildren   Son works in same substance abuse counseling center as patient     Allergies  Allergen Reactions  . Bee Venom Anaphylaxis, Shortness Of Breath, Swelling and Other (See Comments)    Swelling at site   . Amlodipine Swelling and Other (See Comments)    Swelling of the ankles and hands   . Levofloxacin Other (See Comments)    Joint pain   . Hctz [Hydrochlorothiazide] Palpitations and Other (See Comments)    Sweating      Prior to Admission medications     Medication Sig Start Date End Date Taking? Authorizing Provider  acetaminophen (TYLENOL) 500 MG tablet Take 500 mg by mouth every 6 (six) hours as needed for mild pain, moderate pain, fever or headache. Reported on 05/24/2016   Yes [provider]  alendronate (FOSAMAX) 70 MG tablet Take 1 tablet (70 mg total) by mouth every 7 (seven) days. Take with a full glass of water on an empty stomach. 11/21/18  Yes Forrest Moron, MD  Ascorbic Acid (VITAMIN C) 1000 MG tablet Take 1,000 mg by mouth daily.   Yes [provider]  aspirin EC 81 MG tablet Take 81 mg by mouth daily. Reported on 04/11/2016   Yes [provider]  atorvastatin (LIPITOR) 40 MG tablet TAKE ONE TABLET BY MOUTH ONCE DAILY. 11/21/18  Yes Delia Chimes A, MD  calcium carbonate (TUMS EX) 750 MG chewable tablet Chew 1 tablet by mouth as needed for heartburn.   Yes [provider]  Cholecalciferol (VITAMIN D3 PO) Take 1,200 mg by mouth daily.   Yes [provider]  fish oil-omega-3 fatty acids 1000 MG capsule Take 1 capsule by mouth daily.    Yes [provider]  Fluticasone-Salmeterol (ADVAIR) 250-50 MCG/DOSE AEPB Inhale 1 puff into the lungs 2 (two) times daily. 11/21/18  Yes Delia Chimes A, MD  metoprolol succinate (TOPROL-XL) 50 MG 24 hr tablet Take 1 tablet (50 mg total) by mouth daily. Take with or immediately following a meal. 11/21/18  Yes Stallings, Zoe A, MD  tizanidine (ZANAFLEX) 2 MG capsule Take 1 capsule (2 mg total) by mouth 3 (three) times daily as needed for muscle spasms. 11/21/18  Yes Forrest Moron, MD  traMADol (ULTRAM) 50 MG tablet Take 1 tablet (50 mg total) by mouth every 6 (six) hours as needed (BACK PAIN). 11/21/18  Yes Forrest Moron, MD  traZODone (DESYREL) 50 MG tablet Take 0.5-1 tablets (25-50 mg total) by mouth at bedtime as needed for sleep. 11/21/18  Yes Forrest Moron, MD     Depression screen [facility] 2/9 11/26/2018 11/21/2018 05/19/2018 03/04/2018 01/23/2018   Decreased Interest 0 0 0 0 0  Down, Depressed, Hopeless 0 0 0 0 0  PHQ - 2 Score 0 0 0 0 0  Altered sleeping 0 - - - -  Tired, decreased energy 0 - - - -  Change in appetite 0 - - - -  Feeling bad or failure about yourself  0 - - - -  Trouble concentrating 0 - - - -  Moving slowly or fidgety/restless 0 - - - -  Suicidal thoughts 0 - - - -  PHQ-9 Score 0 - - - -  Difficult doing work/chores Not difficult at all - - - -  Some recent data might be hidden     Fall Risk  11/26/2018 11/21/2018 05/19/2018 03/04/2018 11/13/2017  Falls in the past year? 0 0 No No No  Number falls in past yr: 0 - - - -  Injury with Fall? 0 - - - -  Follow up Education provided - - - -      PHYSICAL EXAM: BP 118/75   Pulse 82   Ht 5' 3.5" (1.613 m)   Wt 177 lb 9.6 oz (80.6 kg)   BMI 30.97 kg/m    Wt Readings from Last 3 Encounters:  11/26/18 177 lb 9.6 oz (80.6 kg)  11/21/18 173 lb 6.4 oz (78.7 kg)  05/27/18 170 lb 12.8 oz (77.5 kg)   Physical Exam   Education/Counseling provided regarding diet and exercise, prevention of chronic diseases, smoking/tobacco cessation, if applicable, and reviewed "Covered Medicare Preventive Services."  Will get [facility] educated.  Thank you for taking time to come for your Medicare Wellness Visit. I appreciate your ongoing commitment to your health goals. Please review the following plan we discussed and let me know if I can assist you in the future.  Leroy Kennedy LPN

## 2018-11-27 ENCOUNTER — Telehealth: Payer: Self-pay

## 2018-11-27 ENCOUNTER — Inpatient Hospital Stay (HOSPITAL_BASED_OUTPATIENT_CLINIC_OR_DEPARTMENT_OTHER): Payer: Medicare Other | Admitting: Internal Medicine

## 2018-11-27 ENCOUNTER — Encounter: Payer: Self-pay | Admitting: Internal Medicine

## 2018-11-27 VITALS — BP 154/87 | HR 73 | Temp 99.1°F | Resp 20 | Ht 63.0 in | Wt 177.1 lb

## 2018-11-27 DIAGNOSIS — J449 Chronic obstructive pulmonary disease, unspecified: Secondary | ICD-10-CM

## 2018-11-27 DIAGNOSIS — C3411 Malignant neoplasm of upper lobe, right bronchus or lung: Secondary | ICD-10-CM

## 2018-11-27 DIAGNOSIS — R011 Cardiac murmur, unspecified: Secondary | ICD-10-CM | POA: Diagnosis not present

## 2018-11-27 DIAGNOSIS — I10 Essential (primary) hypertension: Secondary | ICD-10-CM | POA: Diagnosis not present

## 2018-11-27 DIAGNOSIS — E785 Hyperlipidemia, unspecified: Secondary | ICD-10-CM | POA: Diagnosis not present

## 2018-11-27 DIAGNOSIS — Z79899 Other long term (current) drug therapy: Secondary | ICD-10-CM | POA: Diagnosis not present

## 2018-11-27 DIAGNOSIS — G629 Polyneuropathy, unspecified: Secondary | ICD-10-CM

## 2018-11-27 DIAGNOSIS — Z85528 Personal history of other malignant neoplasm of kidney: Secondary | ICD-10-CM | POA: Diagnosis not present

## 2018-11-27 DIAGNOSIS — C3491 Malignant neoplasm of unspecified part of right bronchus or lung: Secondary | ICD-10-CM

## 2018-11-27 DIAGNOSIS — Z7982 Long term (current) use of aspirin: Secondary | ICD-10-CM

## 2018-11-27 DIAGNOSIS — C3490 Malignant neoplasm of unspecified part of unspecified bronchus or lung: Secondary | ICD-10-CM

## 2018-11-27 DIAGNOSIS — F419 Anxiety disorder, unspecified: Secondary | ICD-10-CM | POA: Diagnosis not present

## 2018-11-27 DIAGNOSIS — G4730 Sleep apnea, unspecified: Secondary | ICD-10-CM | POA: Diagnosis not present

## 2018-11-27 NOTE — Telephone Encounter (Signed)
Printed avs and calender of upcoming appointment. Per 1/16 los

## 2018-11-27 NOTE — Progress Notes (Signed)
[place] [phone]   [phone]  OFFICE PROGRESS NOTE  Forrest Moron, MD [address] [place] [address]  DIAGNOSIS: Stage IIIA (T2a, N2, M0) non-small cell lung cancer, squamous cell carcinoma presented with large right upper lobe lung mass with right hilar involvement diagnosed in November 2016.  PRIOR THERAPY:  1) Neoadjuvant systemic chemotherapy with carboplatin for AUC of 6 and paclitaxel 200 MG/M2 every 3 weeks, status post 3 cycles with partial response. 2) Video bronchoscopy, right video-assisted thoracoscopy, minithoracotomy, right upper lobectomy with lymph node dissection on 01/18/2016. The final pathology revealed residual squamous cell carcinoma measuring 3.1 cm with close resection margin and metastatic carcinoma to lymph nodes at levels 10R, 12R and 4R. (ypT2a, yN2).  3) Concurrent chemoradiation with weekly carboplatin for AUC of 2 and paclitaxel 45 MG/M2. First dose 02/27/2016. Status post 5 cycles. Last dose was given 03/26/2016. 4) SBRT to left lower lobe lung nodule under the care of Dr. Sondra Come completed on 11/06/2016.  CURRENT THERAPY: Observation.  INTERVAL HISTORY: Tammy Ball 78 y.o. female returns to the clinic today for 6 months follow-up visit.  The patient is feeling fine today with no concerning complaints.  She mentions that she felt the best these days compared to the last 3 years.  She denied having any chest pain, shortness of breath, cough or hemoptysis.  She denied having any fever or chills.  She has no nausea, vomiting, diarrhea or constipation.  She denied having any headache or visual changes.  The patient had repeat CT scan of the chest performed recently and she is here for evaluation and discussion of her scan results.  MEDICAL HISTORY: Past Medical History:  Diagnosis Date  . Anemia    was while doing chemo  . Anxiety   . Asthma   . Chemotherapy-induced neuropathy ([place]) 11/01/2015  .  Claustrophobia   . COPD (chronic obstructive pulmonary disease) ([place])   . Depression   . Encounter for antineoplastic chemotherapy 02/09/2016  . Glaucoma   . Headache    prior to menopause  . Heart murmur   . History of echocardiogram    Echo 2/17: EF 98-92%, grade 1 diastolic dysfunction, mild MR, trivial pericardial effusion  . History of nuclear stress test    Myoview 2/17: no ischemia or scar, EF 79%; low risk  . History of radiation therapy 10/30/16-11/06/16  . Hyperlipidemia   . Hypertension   . Insomnia 05/16/2016  . Non-small cell carcinoma of right lung, stage 2 ([place]) 10/03/2015  . Radiation 02/29/16-04/10/16   50.4 Gy to right central chest  . Renal cell carcinoma ([place])    L nephrectomy  in 2012    ALLERGIES:  is allergic to bee venom; amlodipine; levofloxacin; and hctz [hydrochlorothiazide].  MEDICATIONS:  Current Outpatient Medications  Medication Sig Dispense Refill  . acetaminophen (TYLENOL) 500 MG tablet Take 500 mg by mouth every 6 (six) hours as needed for mild pain, moderate pain, fever or headache. Reported on 05/24/2016    . alendronate (FOSAMAX) 70 MG tablet Take 1 tablet (70 mg total) by mouth every 7 (seven) days. Take with a full glass of water on an empty stomach. 12 tablet 3  . Ascorbic Acid (VITAMIN C) 1000 MG tablet Take 1,000 mg by mouth daily.    Marland Kitchen aspirin EC 81 MG tablet Take 81 mg by mouth daily. Reported on 04/11/2016    . atorvastatin (LIPITOR) 40 MG tablet TAKE ONE TABLET BY MOUTH  ONCE DAILY. 90 tablet 1  . calcium carbonate (TUMS EX) 750 MG chewable tablet Chew 1 tablet by mouth as needed for heartburn.    . Cholecalciferol (VITAMIN D3 PO) Take 1,200 mg by mouth daily.    . fish oil-omega-3 fatty acids 1000 MG capsule Take 1 capsule by mouth daily.     . Fluticasone-Salmeterol (ADVAIR) 250-50 MCG/DOSE AEPB Inhale 1 puff into the lungs 2 (two) times daily. 180 each 3  . metoprolol succinate (TOPROL-XL) 50 MG 24 hr tablet Take 1 tablet (50 mg total) by  mouth daily. Take with or immediately following a meal. 90 tablet 1  . tizanidine (ZANAFLEX) 2 MG capsule Take 1 capsule (2 mg total) by mouth 3 (three) times daily as needed for muscle spasms. 90 capsule 1  . traMADol (ULTRAM) 50 MG tablet Take 1 tablet (50 mg total) by mouth every 6 (six) hours as needed (BACK PAIN). 90 tablet 0  . traZODone (DESYREL) 50 MG tablet Take 0.5-1 tablets (25-50 mg total) by mouth at bedtime as needed for sleep. 90 tablet 3   No current facility-administered medications for this visit.     SURGICAL HISTORY:  Past Surgical History:  Procedure Laterality Date  . BACK SURGERY     cervical 1991  . EYE SURGERY    . IR GENERIC HISTORICAL  01/16/2017   IR RADIOLOGIST EVAL & MGMT 01/16/2017 [facility]  . IR GENERIC HISTORICAL  01/31/2017   IR FLUORO GUIDED NEEDLE PLC ASPIRATION/INJECTION LOC 01/31/2017 Luanne Bras, MD [facility]  . IR GENERIC HISTORICAL  01/31/2017   IR VERTEBROPLASTY CERV/THOR BX INC UNI/BIL INC/INJECT/IMAGING 01/31/2017 Luanne Bras, MD [facility]  . IR GENERIC HISTORICAL  01/31/2017   IR VERTEBROPLASTY EA ADDL (T&LS) BX INC UNI/BIL INC INJECT/IMAGING 01/31/2017 Luanne Bras, MD [facility]  . kidney cancer    . NEPHRECTOMY    . SPINE SURGERY    . TUBAL LIGATION    . VIDEO ASSISTED THORACOSCOPY (VATS)/WEDGE RESECTION Right 01/18/2016   Procedure: VIDEO ASSISTED THORACOSCOPY (VATS)/LUNG RESECTION, THOROCOTOMY, RIGHT UPPER LOBECTOMY, LYMPH NODE DISSECTION, PLACEMENT OF ON Q;  Surgeon: [name], MD;  Location: [facility];  Service: Thoracic;  Laterality: Right;  Marland Kitchen VIDEO BRONCHOSCOPY Bilateral 09/20/2015   Procedure: VIDEO BRONCHOSCOPY WITHOUT FLUORO;  Surgeon: Rigoberto Noel, MD;  Location: [facility];  Service: Cardiopulmonary;  Laterality: Bilateral;  . VIDEO BRONCHOSCOPY N/A 01/18/2016   Procedure: VIDEO BRONCHOSCOPY;  Surgeon: [name], MD;  Location: [facility] OR;  Service: Thoracic;  Laterality: N/A;    REVIEW OF  SYSTEMS:  A comprehensive review of systems was negative.   PHYSICAL EXAMINATION: General appearance: alert, cooperative and no distress Head: Normocephalic, without obvious abnormality, atraumatic Neck: no adenopathy, no JVD, supple, symmetrical, trachea midline and thyroid not enlarged, symmetric, no tenderness/mass/nodules Lymph nodes: Cervical, supraclavicular, and axillary nodes normal. Resp: clear to auscultation bilaterally Back: symmetric, no curvature. ROM normal. No CVA tenderness. Cardio: regular rate and rhythm, S1, S2 normal, no murmur, click, rub or gallop GI: soft, non-tender; bowel sounds normal; no masses,  no organomegaly Extremities: extremities normal, atraumatic, no cyanosis or edema  ECOG PERFORMANCE STATUS: 1 - Symptomatic but completely ambulatory  Blood pressure (!) 154/87, pulse 73, temperature 99.1 F (37.3 C), temperature source Oral, resp. rate 20, height 5\' 3"  (1.6 m), weight 177 lb 1.6 oz (80.3 kg), SpO2 96 %.  LABORATORY DATA: Lab Results  Component Value Date   WBC 6.8 11/25/2018   HGB 14.1 11/25/2018  HCT 43.8 11/25/2018   MCV 96.1 11/25/2018   PLT 259 11/25/2018      Chemistry      Component Value Date/Time   NA 143 11/25/2018 0847   NA 140 11/21/2018 1058   NA 140 11/11/2017 0939   K 5.3 (H) 11/25/2018 0847   K 4.6 11/11/2017 0939   CL 105 11/25/2018 0847   CO2 29 11/25/2018 0847   CO2 28 11/11/2017 0939   BUN 20 11/25/2018 0847   BUN 23 11/21/2018 1058   BUN 30.3 (H) 11/11/2017 0939   CREATININE 1.20 (H) 11/25/2018 0847   CREATININE 1.3 (H) 11/11/2017 0939   GLU 105 12/10/2009      Component Value Date/Time   CALCIUM 10.0 11/25/2018 0847   CALCIUM 10.0 11/11/2017 0939   ALKPHOS 74 11/25/2018 0847   ALKPHOS 102 11/11/2017 0939   AST 26 11/25/2018 0847   AST 18 11/11/2017 0939   ALT 25 11/25/2018 0847   ALT 19 11/11/2017 0939   BILITOT 0.7 11/25/2018 0847   BILITOT 0.42 11/11/2017 0939       RADIOGRAPHIC STUDIES: Ct  Chest Wo Contrast  Result Date: 11/25/2018 CLINICAL DATA:  LEFT renal cell carcinoma. LEFT nephrectomy. Lung cancer with RIGHT upper lobectomy. EXAM: CT CHEST WITHOUT CONTRAST TECHNIQUE: Multidetector CT imaging of the chest was performed following the standard protocol without IV contrast. COMPARISON:  05/12/2018 FINDINGS: Cardiovascular: Coronary artery calcification and aortic atherosclerotic calcification. Mediastinum/Nodes: No axillary or supraclavicular adenopathy. No mediastinal. No pericardial effusion. Esophagus normal. Lungs/Pleura: Band of pleural-parenchymal thickening in the LEFT lower lobe measuring 25 x 14 mm compared to 29 x 13 mm for no interval change. No new pulmonary nodules. Mild centrilobular emphysema the upper lobes. Postsurgical change in the RIGHT upper lung with perihilar consolidation bronchiectasis. No interval change. Upper Abdomen: Limited view of the liver, RIGHT kidney, pancreas are unremarkable. Normal adrenal glands. Musculoskeletal: Compression deformities at T3-T4 with augmentation. No change from comparison exam. IMPRESSION: 1. No evidence of lung cancer progression. 2. Stable band like pleural-parenchymal thickening in LEFT lower lobe. Electronically Signed   By: Suzy Bouchard M.D.   On: 11/25/2018 13:59    ASSESSMENT AND PLAN:  This is a very pleasant 78 years old white female with a stage IIIa non-small cell lung cancer, squamous cell carcinoma status post neoadjuvant systemic chemotherapy was carboplatin and paclitaxel with partial response followed by right upper lobectomy and lymph node dissection. She was found to have evidence of metastatic disease to the mediastinal lymph nodes and close resection margin. The patient underwent a course of concurrent chemoradiation with weekly carboplatin and paclitaxel. She was also treated with SBRT left lower lobe pulmonary nodule under the care of Dr. Sondra Come. The patient is currently on observation and she has no concerning  complaints. She had repeat CT scan of the chest performed recently.  I personally and independently reviewed the scans and discussed the results with the patient today. Her scan showed no concerning findings for disease recurrence or progression. I recommended for the patient to continue on observation with repeat CT scan of the chest in 6 months. She was advised to call immediately if she has any concerning symptoms in the interval. The patient voices understanding of current disease status and treatment options and is in agreement with the current care plan. All questions were answered. The patient knows to call the clinic with any problems, questions or concerns. We can certainly see the patient much sooner if necessary. I spent 10 minutes  counseling the patient face to face. The total time spent in the appointment was 15 minutes.  Disclaimer: This note was dictated with voice recognition software. Similar sounding words can inadvertently be transcribed and may not be corrected upon review.

## 2018-12-08 DIAGNOSIS — H40013 Open angle with borderline findings, low risk, bilateral: Secondary | ICD-10-CM | POA: Diagnosis not present

## 2018-12-08 DIAGNOSIS — H1859 Other hereditary corneal dystrophies: Secondary | ICD-10-CM | POA: Diagnosis not present

## 2018-12-08 DIAGNOSIS — Z961 Presence of intraocular lens: Secondary | ICD-10-CM | POA: Diagnosis not present

## 2018-12-08 DIAGNOSIS — H00031 Abscess of right upper eyelid: Secondary | ICD-10-CM | POA: Diagnosis not present

## 2018-12-20 ENCOUNTER — Other Ambulatory Visit: Payer: Self-pay

## 2018-12-20 ENCOUNTER — Ambulatory Visit (INDEPENDENT_AMBULATORY_CARE_PROVIDER_SITE_OTHER): Payer: Medicare Other | Admitting: Family Medicine

## 2018-12-20 ENCOUNTER — Encounter: Payer: Self-pay | Admitting: Family Medicine

## 2018-12-20 VITALS — BP 144/88 | HR 71 | Temp 98.9°F | Ht 63.0 in | Wt 176.6 lb

## 2018-12-20 DIAGNOSIS — R0602 Shortness of breath: Secondary | ICD-10-CM

## 2018-12-20 DIAGNOSIS — J029 Acute pharyngitis, unspecified: Secondary | ICD-10-CM | POA: Diagnosis not present

## 2018-12-20 DIAGNOSIS — J449 Chronic obstructive pulmonary disease, unspecified: Secondary | ICD-10-CM | POA: Diagnosis not present

## 2018-12-20 DIAGNOSIS — R0989 Other specified symptoms and signs involving the circulatory and respiratory systems: Secondary | ICD-10-CM | POA: Diagnosis not present

## 2018-12-20 DIAGNOSIS — Z09 Encounter for follow-up examination after completed treatment for conditions other than malignant neoplasm: Secondary | ICD-10-CM

## 2018-12-20 DIAGNOSIS — R062 Wheezing: Secondary | ICD-10-CM | POA: Diagnosis not present

## 2018-12-20 LAB — POCT INFLUENZA A/B
Influenza A, POC: NEGATIVE
Influenza B, POC: NEGATIVE

## 2018-12-20 LAB — POCT RAPID STREP A (OFFICE): Rapid Strep A Screen: NEGATIVE

## 2018-12-20 MED ORDER — IPRATROPIUM BROMIDE 0.02 % IN SOLN
0.5000 mg | Freq: Once | RESPIRATORY_TRACT | Status: AC
  Administered 2018-12-20: 0.5 mg via RESPIRATORY_TRACT

## 2018-12-20 MED ORDER — ALBUTEROL SULFATE (2.5 MG/3ML) 0.083% IN NEBU
2.5000 mg | INHALATION_SOLUTION | Freq: Once | RESPIRATORY_TRACT | Status: AC
  Administered 2018-12-20: 2.5 mg via RESPIRATORY_TRACT

## 2018-12-20 MED ORDER — ALBUTEROL SULFATE HFA 108 (90 BASE) MCG/ACT IN AERS: 1.0000 | INHALATION_SPRAY | Freq: Four times a day (QID) | RESPIRATORY_TRACT | 6 refills | Status: DC | PRN

## 2018-12-20 NOTE — Patient Instructions (Addendum)
If you have lab work done today you will be contacted with your lab results within the next 2 weeks.  If you have not heard from [place] then please contact us. The fastest way to get your results is to register for My Chart.   IF you received an x-ray today, you will receive an invoice from [facility]. Please contact [facility] at [phone] with questions or concerns regarding your invoice.   IF you received labwork today, you will receive an invoice from [facility]. Please contact [facility] at [phone] with questions or concerns regarding your invoice.   Our billing staff will not be able to assist you with questions regarding bills from these companies.  You will be contacted with the lab results as soon as they are available. The fastest way to get your results is to activate your My Chart account. Instructions are located on the last page of this paperwork. If you have not heard from [place] regarding the results in 2 weeks, please contact this office.      Viral Respiratory Infection A respiratory infection is an illness that affects part of the respiratory system, such as the lungs, nose, or throat. A respiratory infection that is caused by a virus is called a viral respiratory infection. Common types of viral respiratory infections include:  A cold.  The flu (influenza).  A respiratory syncytial virus (RSV) infection. What are the causes? This condition is caused by a virus. What are the signs or symptoms? Symptoms of this condition include:  A stuffy or runny nose.  Yellow or green nasal discharge.  A cough.  Sneezing.  Fatigue.  Achy muscles.  A sore throat.  Sweating or chills.  A fever.  A headache. How is this diagnosed? This condition may be diagnosed based on:  Your symptoms.  A physical exam.  Testing of nasal swabs. How is this treated? This condition may be treated with medicines, such as:  Antiviral medicine. This may  shorten the length of time a person has symptoms.  Expectorants. These make it easier to cough up mucus.  Decongestant nasal sprays.  Acetaminophen or NSAIDs to relieve fever and pain. Antibiotic medicines are not prescribed for viral infections. This is because antibiotics are designed to kill bacteria. They are not effective against viruses. Follow these instructions at home:  Managing pain and congestion  Take over-the-counter and prescription medicines only as told by your health care provider.  If you have a sore throat, gargle with a salt-water mixture 3-4 times a day or as needed. To make a salt-water mixture, completely dissolve -1 tsp of salt in 1 cup of warm water.  Use nose drops made from salt water to ease congestion and soften raw skin around your nose.  Drink enough fluid to keep your urine pale yellow. This helps prevent dehydration and helps loosen up mucus. General instructions  Rest as much as possible.  Do not drink alcohol.  Do not use any products that contain nicotine or tobacco, such as cigarettes and e-cigarettes. If you need help quitting, ask your health care provider.  Keep all follow-up visits as told by your health care provider. This is important. How is this prevented?   Get an annual flu shot. You may get the flu shot in late summer, fall, or winter. Ask your health care provider when you should get your flu shot.  Avoid exposing others to your respiratory infection. ? Stay home from work or school as told  by your health care provider. ? Wash your hands with soap and water often, especially after you cough or sneeze. If soap and water are not available, use alcohol-based hand sanitizer.  Avoid contact with people who are sick during cold and flu season. This is generally fall and winter. Contact a health care provider if:  Your symptoms last for 10 days or longer.  Your symptoms get worse over time.  You have a fever.  You have severe  sinus pain in your face or forehead.  The glands in your jaw or neck become very swollen. Get help right away if you:  Feel pain or pressure in your chest.  Have shortness of breath.  Faint or feel like you will faint.  Have severe and persistent vomiting.  Feel confused or disoriented. Summary  A respiratory infection is an illness that affects part of the respiratory system, such as the lungs, nose, or throat. A respiratory infection that is caused by a virus is called a viral respiratory infection.  Common types of viral respiratory infections are a cold, influenza, and respiratory syncytial virus (RSV) infection.  Symptoms of this condition include a stuffy or runny nose, cough, sneezing, fatigue, achy muscles, sore throat, and fevers or chills.  Antibiotic medicines are not prescribed for viral infections. This is because antibiotics are designed to kill bacteria. They are not effective against viruses. This information is not intended to replace advice given to you by your health care provider. Make sure you discuss any questions you have with your health care provider. Document Released: 08/08/2005 Document Revised: 12/09/2017 Document Reviewed: 12/09/2017 Elsevier Interactive Patient Education  2019 [employer].

## 2018-12-20 NOTE — Progress Notes (Signed)
Established Patient Office Visit  Subjective:  Patient ID: Tammy Ball, female    DOB: 1941-05-14  Age: 78 y.o. MRN: [id number]  CC:  Chief Complaint  Patient presents with  . Cough    after to have pneumonia   . Sore Throat    sx from Tue  . Chest Congestion   HPI Tammy Ball is a 77 year old female who presents for Sick Visit.   Past Medical History:  Diagnosis Date  . Anemia    was while doing chemo  . Anxiety   . Asthma   . Chemotherapy-induced neuropathy ([place]) 11/01/2015  . Claustrophobia   . COPD (chronic obstructive pulmonary disease) ([place])   . Depression   . Encounter for antineoplastic chemotherapy 02/09/2016  . Glaucoma   . Headache    prior to menopause  . Heart murmur   . History of echocardiogram    Echo 2/17: EF 87-86%, grade 1 diastolic dysfunction, mild MR, trivial pericardial effusion  . History of nuclear stress test    Myoview 2/17: no ischemia or scar, EF 79%; low risk  . History of radiation therapy 10/30/16-11/06/16  . Hyperlipidemia   . Hypertension   . Insomnia 05/16/2016  . Non-small cell carcinoma of right lung, stage 2 ([place]) 10/03/2015  . Radiation 02/29/16-04/10/16   50.4 Gy to right central chest  . Renal cell carcinoma ([place])    L nephrectomy  in 2012   Current Status: Since her last office visit, she has c/o cough, sore throat, fevers, fatigue, and chills X 5 days now. She has COPD. She has been taking Mucinex, Acetaminophen, and breathing txs to help with symptoms. She states that her grandson was mildly sick and she was in close contact with him over the weekend.   She denies fevers, chills, fatigue, recent infections, weight loss, and night sweats. She has not had any headaches, visual changes, dizziness, and falls. No chest pain, heart palpitations, cough and shortness of breath reported. No reports of GI problems such as nausea, vomiting, diarrhea, and constipation. She has no reports of blood in stools, dysuria and hematuria. No  depression or anxiety reported. She denies pain today.   Past Surgical History:  Procedure Laterality Date  . BACK SURGERY     cervical 1991  . EYE SURGERY    . IR GENERIC HISTORICAL  01/16/2017   IR RADIOLOGIST EVAL & MGMT 01/16/2017 [facility]  . IR GENERIC HISTORICAL  01/31/2017   IR FLUORO GUIDED NEEDLE PLC ASPIRATION/INJECTION LOC 01/31/2017 Luanne Bras, MD [facility]  . IR GENERIC HISTORICAL  01/31/2017   IR VERTEBROPLASTY CERV/THOR BX INC UNI/BIL INC/INJECT/IMAGING 01/31/2017 Luanne Bras, MD [facility]  . IR GENERIC HISTORICAL  01/31/2017   IR VERTEBROPLASTY EA ADDL (T&LS) BX INC UNI/BIL INC INJECT/IMAGING 01/31/2017 Luanne Bras, MD [facility]  . kidney cancer    . NEPHRECTOMY    . SPINE SURGERY    . TUBAL LIGATION    . VIDEO ASSISTED THORACOSCOPY (VATS)/WEDGE RESECTION Right 01/18/2016   Procedure: VIDEO ASSISTED THORACOSCOPY (VATS)/LUNG RESECTION, THOROCOTOMY, RIGHT UPPER LOBECTOMY, LYMPH NODE DISSECTION, PLACEMENT OF ON Q;  Surgeon: [name], MD;  Location: [facility];  Service: Thoracic;  Laterality: Right;  Marland Kitchen VIDEO BRONCHOSCOPY Bilateral 09/20/2015   Procedure: VIDEO BRONCHOSCOPY WITHOUT FLUORO;  Surgeon: Rigoberto Noel, MD;  Location: [facility];  Service: Cardiopulmonary;  Laterality: Bilateral;  . VIDEO BRONCHOSCOPY N/A 01/18/2016   Procedure: VIDEO BRONCHOSCOPY;  Surgeon: [name], MD;  Location: [facility];  Service: Thoracic;  Laterality: N/A;    Family History  Problem Relation Age of Onset  . Heart disease Sister   . Obesity Brother   . Heart attack Daughter 55       s/p CABG  . Glaucoma Daughter   . Breast cancer Sister   . Birth defects Sister   . Cancer Mother        Bladder Cancer  . Hypertension Mother   . CAD Mother 54  . Cancer Maternal Grandmother     Social History   Socioeconomic History  . Marital status: Widowed    Spouse name: Not on file  . Number of children: 4  . Years of education: Not on file  . Highest  education level: High school graduate  Occupational History  . Not on file  Social Needs  . Financial resource strain: Not hard at all  . Food insecurity:    Worry: Never true    Inability: Never true  . Transportation needs:    Medical: No    Non-medical: No  Tobacco Use  . Smoking status: Former Smoker    Packs/day: 0.50    Years: 50.00    Pack years: 25.00    Types: Cigarettes    Last attempt to quit: 03/16/2011    Years since quitting: 7.7  . Smokeless tobacco: Never Used  Substance and Sexual Activity  . Alcohol use: No    Alcohol/week: 0.0 standard drinks  . Drug use: No  . Sexual activity: Not Currently  Lifestyle  . Physical activity:    Days per week: 0 days    Minutes per session: 0 min  . Stress: Only a little  Relationships  . Social connections:    Talks on phone: More than three times a week    Gets together: More than three times a week    Attends religious service: More than 4 times per year    Active member of club or organization: Yes    Attends meetings of clubs or organizations: More than 4 times per year    Relationship status: Widowed  . Intimate partner violence:    Fear of current or ex partner: No    Emotionally abused: No    Physically abused: No    Forced sexual activity: No  Other Topics Concern  . Not on file  Social History Narrative   Marital status: divorced; not dating in 2019.      Children: 4 children; 3 grandchildren adult; 4 gg.      Lives: alone in house      Employment: part-time work; substance abuse counselor; [employer].      Tobacco: quit smoking 2012; smoked 45 years      Alcohol: none      Drugs: none      Exercise: none in 2019; due to LEFT sciatica.      ADLs: independent with ADLs; drives.       Advanced Directives: YES: HCPOA: Nicholas Martinez/son.  FULL CODE but no prolonged measures.      Occupation: Substance Abuse Estate agent   No exercise** Merged History Encounter **        ** Data from: 12/14/11 Enc Dept: [facility]       ** Data from: 12/17/11 Enc Dept: [facility]   Substance abuse counselor   Husband deceased   4 great grandchildren   Son works in  same substance abuse counseling center as patient    Outpatient Medications Prior to Visit  Medication Sig Dispense Refill  . acetaminophen (TYLENOL) 500 MG tablet Take 500 mg by mouth every 6 (six) hours as needed for mild pain, moderate pain, fever or headache. Reported on 05/24/2016    . alendronate (FOSAMAX) 70 MG tablet Take 1 tablet (70 mg total) by mouth every 7 (seven) days. Take with a full glass of water on an empty stomach. 12 tablet 3  . Ascorbic Acid (VITAMIN C) 1000 MG tablet Take 1,000 mg by mouth daily.    Marland Kitchen aspirin EC 81 MG tablet Take 81 mg by mouth daily. Reported on 04/11/2016    . atorvastatin (LIPITOR) 40 MG tablet TAKE ONE TABLET BY MOUTH ONCE DAILY. 90 tablet 1  . calcium carbonate (TUMS EX) 750 MG chewable tablet Chew 1 tablet by mouth as needed for heartburn.    . Cholecalciferol (VITAMIN D3 PO) Take 1,200 mg by mouth daily.    . fish oil-omega-3 fatty acids 1000 MG capsule Take 1 capsule by mouth daily.     . Fluticasone-Salmeterol (ADVAIR) 250-50 MCG/DOSE AEPB Inhale 1 puff into the lungs 2 (two) times daily. 180 each 3  . metoprolol succinate (TOPROL-XL) 50 MG 24 hr tablet Take 1 tablet (50 mg total) by mouth daily. Take with or immediately following a meal. 90 tablet 1  . tizanidine (ZANAFLEX) 2 MG capsule Take 1 capsule (2 mg total) by mouth 3 (three) times daily as needed for muscle spasms. 90 capsule 1  . traMADol (ULTRAM) 50 MG tablet Take 1 tablet (50 mg total) by mouth every 6 (six) hours as needed (BACK PAIN). 90 tablet 0  . traZODone (DESYREL) 50 MG tablet Take 0.5-1 tablets (25-50 mg total) by mouth at bedtime as needed for sleep. 90 tablet 3  . albuterol (VENTOLIN HFA) 108 (90 Base) MCG/ACT inhaler      No facility-administered medications prior to visit.      Allergies  Allergen Reactions  . Bee Venom Anaphylaxis, Shortness Of Breath, Swelling and Other (See Comments)    Swelling at site   . Amlodipine Swelling and Other (See Comments)    Swelling of the ankles and hands   . Levofloxacin Other (See Comments)    Joint pain   . Hctz [Hydrochlorothiazide] Palpitations and Other (See Comments)    Sweating    ROS Review of Systems  Constitutional: Negative.   HENT: Positive for congestion.   Eyes: Negative.   Respiratory: Positive for cough (productive) and shortness of breath.   Cardiovascular: Negative.   Gastrointestinal: Negative.   Endocrine: Negative.   Genitourinary: Negative.   Musculoskeletal: Negative.   Skin: Negative.   Allergic/Immunologic: Negative.   Neurological: Negative.   Hematological: Negative.   Psychiatric/Behavioral: Negative.    Objective:    Physical Exam  Constitutional: She is oriented to person, place, and time. She appears well-developed and well-nourished.  HENT:  Head: Normocephalic and atraumatic.  Eyes: Conjunctivae are normal.  Neck: Normal range of motion. Neck supple.  Cardiovascular: Normal rate, regular rhythm, normal heart sounds and intact distal pulses.  Pulmonary/Chest: She has wheezes. She has rales.  Abdominal: Soft. Bowel sounds are normal.  Musculoskeletal: Normal range of motion.  Neurological: She is alert and oriented to person, place, and time. She has normal reflexes.  Skin: Skin is warm and dry.  Psychiatric: She has a normal mood and affect. Her behavior is normal. Judgment and thought content normal.  BP (!) 144/88   Pulse 71   Temp 98.9 F (37.2 C) (Oral)   Ht _0  (1.6 m)   Wt 176 lb 9.6 oz (80.1 kg)   SpO2 96%   BMI 31.28 kg/m  Wt Readings from Last 3 Encounters:  12/20/18 176 lb 9.6 oz (80.1 kg)  11/27/18 177 lb 1.6 oz (80.3 kg)  11/26/18 177 lb 9.6 oz (80.6 kg)     There are no preventive care reminders to display for this patient.  There are no  preventive care reminders to display for this patient.  Lab Results  Component Value Date   TSH 1.320 11/21/2018   Lab Results  Component Value Date   WBC 6.8 11/25/2018   HGB 14.1 11/25/2018   HCT 43.8 11/25/2018   MCV 96.1 11/25/2018   PLT 259 11/25/2018   Lab Results  Component Value Date   NA 143 11/25/2018   K 5.3 (H) 11/25/2018   CHLORIDE 103 11/11/2017   CO2 29 11/25/2018   GLUCOSE 106 (H) 11/25/2018   BUN 20 11/25/2018   CREATININE 1.20 (H) 11/25/2018   BILITOT 0.7 11/25/2018   ALKPHOS 74 11/25/2018   AST 26 11/25/2018   ALT 25 11/25/2018   PROT 7.1 11/25/2018   ALBUMIN 4.0 11/25/2018   CALCIUM 10.0 11/25/2018   ANIONGAP 9 11/25/2018   EGFR 40 (L) 11/11/2017   Lab Results  Component Value Date   CHOL 163 11/21/2018   Lab Results  Component Value Date   HDL 87 11/21/2018   Lab Results  Component Value Date   LDLCALC 61 11/21/2018   Lab Results  Component Value Date   TRIG 73 11/21/2018   Lab Results  Component Value Date   CHOLHDL 1.9 11/21/2018   No results found for: HGBA1C   Assessment & Plan:   1. Shortness of breath Stable at discharge. She received dual nebulizer treatment in office today. No signs or symptoms of respiratory distress at discharge.  - albuterol (VENTOLIN HFA) 108 (90 Base) MCG/ACT inhaler; Inhale 1-2 puffs into the lungs every 6 (six) hours as needed for wheezing or shortness of breath.  Dispense: 1 Inhaler; Refill: 6 - Influenza A/B  2. COPD GOLD II  - albuterol (VENTOLIN HFA) 108 (90 Base) MCG/ACT inhaler; Inhale 1-2 puffs into the lungs every 6 (six) hours as needed for wheezing or shortness of breath.  Dispense: 1 Inhaler; Refill: 6 - Influenza A/B  3. Sore throat She is negative for strep today.  - POCT rapid strep A  4. Wheezes She is negative for Influenza today. She will use nebulizer treatments daily as needed.  - Influenza A/B  5. Rales  6. Bilateral rales - albuterol (PROVENTIL) (2.5 MG/3ML) 0.083%  nebulizer solution 2.5 mg - ipratropium (ATROVENT) nebulizer solution 0.5 mg - Influenza A/B  7. She will follow up as needed.    Problem List Items Addressed This Visit      Respiratory   COPD GOLD II    Relevant Medications   albuterol (VENTOLIN HFA) 108 (90 Base) MCG/ACT inhaler   albuterol (PROVENTIL) (2.5 MG/3ML) 0.083% nebulizer solution 2.5 mg (Completed)   ipratropium (ATROVENT) nebulizer solution 0.5 mg (Completed)   Other Relevant Orders   Influenza A/B (Completed)    Other Visit Diagnoses    Shortness of breath    -  Primary   Relevant Medications   albuterol (VENTOLIN HFA) 108 (90 Base) MCG/ACT inhaler   Other Relevant Orders   Influenza A/B (Completed)  Sore throat       Relevant Orders   POCT rapid strep A (Completed)   Wheezes       Relevant Orders   Influenza A/B (Completed)   Rales       Bilateral rales       Relevant Medications   albuterol (PROVENTIL) (2.5 MG/3ML) 0.083% nebulizer solution 2.5 mg (Completed)   ipratropium (ATROVENT) nebulizer solution 0.5 mg (Completed)   Other Relevant Orders   Influenza A/B (Completed)      Meds ordered this encounter  Medications  . albuterol (VENTOLIN HFA) 108 (90 Base) MCG/ACT inhaler    Sig: Inhale 1-2 puffs into the lungs every 6 (six) hours as needed for wheezing or shortness of breath.    Dispense:  1 Inhaler    Refill:  6  . albuterol (PROVENTIL) (2.5 MG/3ML) 0.083% nebulizer solution 2.5 mg  . ipratropium (ATROVENT) nebulizer solution 0.5 mg    Follow-up: No follow-ups on file.    Azzie Glatter, FNP

## 2018-12-25 ENCOUNTER — Emergency Department (HOSPITAL_COMMUNITY): Payer: Medicare Other

## 2018-12-25 ENCOUNTER — Inpatient Hospital Stay (HOSPITAL_COMMUNITY)
Admission: EM | Admit: 2018-12-25 | Discharge: 2018-12-30 | DRG: 190 | Disposition: A | Discharge status: Home / Self Care | Payer: Medicare Other | Attending: Internal Medicine | Admitting: Internal Medicine

## 2018-12-25 ENCOUNTER — Encounter (HOSPITAL_COMMUNITY): Payer: Self-pay | Admitting: Emergency Medicine

## 2018-12-25 ENCOUNTER — Other Ambulatory Visit: Payer: Self-pay

## 2018-12-25 DIAGNOSIS — Z79899 Other long term (current) drug therapy: Secondary | ICD-10-CM

## 2018-12-25 DIAGNOSIS — E785 Hyperlipidemia, unspecified: Secondary | ICD-10-CM | POA: Diagnosis not present

## 2018-12-25 DIAGNOSIS — Z9221 Personal history of antineoplastic chemotherapy: Secondary | ICD-10-CM

## 2018-12-25 DIAGNOSIS — Z905 Acquired absence of kidney: Secondary | ICD-10-CM

## 2018-12-25 DIAGNOSIS — R05 Cough: Secondary | ICD-10-CM | POA: Diagnosis not present

## 2018-12-25 DIAGNOSIS — J440 Chronic obstructive pulmonary disease with acute lower respiratory infection: Principal | ICD-10-CM | POA: Diagnosis present

## 2018-12-25 DIAGNOSIS — Z923 Personal history of irradiation: Secondary | ICD-10-CM

## 2018-12-25 DIAGNOSIS — J189 Pneumonia, unspecified organism: Secondary | ICD-10-CM | POA: Diagnosis not present

## 2018-12-25 DIAGNOSIS — C3491 Malignant neoplasm of unspecified part of right bronchus or lung: Secondary | ICD-10-CM | POA: Diagnosis present

## 2018-12-25 DIAGNOSIS — Z7982 Long term (current) use of aspirin: Secondary | ICD-10-CM

## 2018-12-25 DIAGNOSIS — J9611 Chronic respiratory failure with hypoxia: Secondary | ICD-10-CM | POA: Diagnosis present

## 2018-12-25 DIAGNOSIS — Z85118 Personal history of other malignant neoplasm of bronchus and lung: Secondary | ICD-10-CM

## 2018-12-25 DIAGNOSIS — J441 Chronic obstructive pulmonary disease with (acute) exacerbation: Secondary | ICD-10-CM | POA: Diagnosis not present

## 2018-12-25 DIAGNOSIS — J9601 Acute respiratory failure with hypoxia: Secondary | ICD-10-CM | POA: Diagnosis present

## 2018-12-25 DIAGNOSIS — H409 Unspecified glaucoma: Secondary | ICD-10-CM | POA: Diagnosis present

## 2018-12-25 DIAGNOSIS — Z91030 Bee allergy status: Secondary | ICD-10-CM

## 2018-12-25 DIAGNOSIS — Z85528 Personal history of other malignant neoplasm of kidney: Secondary | ICD-10-CM

## 2018-12-25 DIAGNOSIS — I10 Essential (primary) hypertension: Secondary | ICD-10-CM | POA: Diagnosis not present

## 2018-12-25 DIAGNOSIS — R197 Diarrhea, unspecified: Secondary | ICD-10-CM | POA: Diagnosis not present

## 2018-12-25 DIAGNOSIS — Z902 Acquired absence of lung [part of]: Secondary | ICD-10-CM

## 2018-12-25 DIAGNOSIS — Z8249 Family history of ischemic heart disease and other diseases of the circulatory system: Secondary | ICD-10-CM

## 2018-12-25 DIAGNOSIS — Z7951 Long term (current) use of inhaled steroids: Secondary | ICD-10-CM

## 2018-12-25 DIAGNOSIS — Z881 Allergy status to other antibiotic agents status: Secondary | ICD-10-CM

## 2018-12-25 DIAGNOSIS — R0602 Shortness of breath: Secondary | ICD-10-CM | POA: Diagnosis not present

## 2018-12-25 DIAGNOSIS — Z888 Allergy status to other drugs, medicaments and biological substances status: Secondary | ICD-10-CM

## 2018-12-25 DIAGNOSIS — Z87891 Personal history of nicotine dependence: Secondary | ICD-10-CM

## 2018-12-25 DIAGNOSIS — F40240 Claustrophobia: Secondary | ICD-10-CM | POA: Diagnosis present

## 2018-12-25 LAB — CBC WITH DIFFERENTIAL/PLATELET
Abs Immature Granulocytes: 0.1 10*3/uL — ABNORMAL HIGH (ref 0.00–0.07)
Basophils Absolute: 0 10*3/uL (ref 0.0–0.1)
Basophils Relative: 0 %
Eosinophils Absolute: 0 10*3/uL (ref 0.0–0.5)
Eosinophils Relative: 0 %
HCT: 39.2 % (ref 36.0–46.0)
Hemoglobin: 12.3 g/dL (ref 12.0–15.0)
Immature Granulocytes: 1 %
Lymphocytes Relative: 7 %
Lymphs Abs: 1 10*3/uL (ref 0.7–4.0)
MCH: 31.5 pg (ref 26.0–34.0)
MCHC: 31.4 g/dL (ref 30.0–36.0)
MCV: 100.3 fL — ABNORMAL HIGH (ref 80.0–100.0)
Monocytes Absolute: 0.9 10*3/uL (ref 0.1–1.0)
Monocytes Relative: 7 %
Neutro Abs: 11.7 10*3/uL — ABNORMAL HIGH (ref 1.7–7.7)
Neutrophils Relative %: 85 %
Platelets: 300 10*3/uL (ref 150–400)
RBC: 3.91 MIL/uL (ref 3.87–5.11)
RDW: 15.4 % (ref 11.5–15.5)
WBC: 13.8 10*3/uL — ABNORMAL HIGH (ref 4.0–10.5)
nRBC: 0 % (ref 0.0–0.2)

## 2018-12-25 LAB — URINALYSIS, ROUTINE W REFLEX MICROSCOPIC
Bacteria, UA: NONE SEEN
Bilirubin Urine: NEGATIVE
Glucose, UA: NEGATIVE mg/dL
Hgb urine dipstick: NEGATIVE
Ketones, ur: NEGATIVE mg/dL
Nitrite: NEGATIVE
Protein, ur: NEGATIVE mg/dL
Specific Gravity, Urine: 1.016 (ref 1.005–1.030)
pH: 5 (ref 5.0–8.0)

## 2018-12-25 LAB — BASIC METABOLIC PANEL
Anion gap: 9 (ref 5–15)
BUN: 18 mg/dL (ref 8–23)
CO2: 27 mmol/L (ref 22–32)
Calcium: 9 mg/dL (ref 8.9–10.3)
Chloride: 96 mmol/L — ABNORMAL LOW (ref 98–111)
Creatinine, Ser: 1.13 mg/dL — ABNORMAL HIGH (ref 0.44–1.00)
GFR calc Af Amer: 55 mL/min — ABNORMAL LOW (ref >60)
GFR calc non Af Amer: 48 mL/min — ABNORMAL LOW (ref >60)
Glucose, Bld: 109 mg/dL — ABNORMAL HIGH (ref 70–99)
Potassium: 4.6 mmol/L (ref 3.5–5.1)
Sodium: 132 mmol/L — ABNORMAL LOW (ref 135–145)

## 2018-12-25 LAB — EXPECTORATED SPUTUM ASSESSMENT W REFEX TO RESP CULTURE

## 2018-12-25 LAB — EXPECTORATED SPUTUM ASSESSMENT W GRAM STAIN, RFLX TO RESP C

## 2018-12-25 LAB — INFLUENZA PANEL BY PCR (TYPE A & B)
Influenza A By PCR: NEGATIVE
Influenza B By PCR: NEGATIVE

## 2018-12-25 MED ORDER — TRAMADOL HCL 50 MG PO TABS
50.0000 mg | ORAL_TABLET | Freq: Four times a day (QID) | ORAL | Status: DC | PRN
  Administered 2018-12-25 – 2018-12-26 (×3): 50 mg via ORAL
  Filled 2018-12-25 (×3): qty 1

## 2018-12-25 MED ORDER — ASPIRIN EC 81 MG PO TBEC
81.0000 mg | DELAYED_RELEASE_TABLET | Freq: Every day | ORAL | Status: DC
  Administered 2018-12-26 – 2018-12-30 (×5): 81 mg via ORAL
  Filled 2018-12-25 (×5): qty 1

## 2018-12-25 MED ORDER — LACTATED RINGERS IV SOLN
INTRAVENOUS | Status: DC
  Administered 2018-12-25: 22:00:00 via INTRAVENOUS

## 2018-12-25 MED ORDER — METOPROLOL SUCCINATE ER 50 MG PO TB24
50.0000 mg | ORAL_TABLET | Freq: Every day | ORAL | Status: DC
  Administered 2018-12-26: 50 mg via ORAL
  Filled 2018-12-25: qty 1

## 2018-12-25 MED ORDER — BENZONATATE 100 MG PO CAPS
100.0000 mg | ORAL_CAPSULE | Freq: Three times a day (TID) | ORAL | Status: DC | PRN
  Administered 2018-12-25 – 2018-12-29 (×5): 100 mg via ORAL
  Filled 2018-12-25 (×6): qty 1

## 2018-12-25 MED ORDER — SODIUM CHLORIDE 0.9 % IV SOLN
500.0000 mg | Freq: Once | INTRAVENOUS | Status: DC
  Filled 2018-12-25: qty 500

## 2018-12-25 MED ORDER — ALBUTEROL SULFATE (2.5 MG/3ML) 0.083% IN NEBU: 2.5000 mg | INHALATION_SOLUTION | RESPIRATORY_TRACT | Status: DC | PRN

## 2018-12-25 MED ORDER — ALBUTEROL SULFATE (2.5 MG/3ML) 0.083% IN NEBU: 5.0000 mg | INHALATION_SOLUTION | Freq: Once | RESPIRATORY_TRACT | Status: DC

## 2018-12-25 MED ORDER — PREDNISONE 20 MG PO TABS
40.0000 mg | ORAL_TABLET | Freq: Every day | ORAL | Status: DC
  Administered 2018-12-25 – 2018-12-26 (×2): 40 mg via ORAL
  Filled 2018-12-25 (×2): qty 2

## 2018-12-25 MED ORDER — ENOXAPARIN SODIUM 40 MG/0.4ML ~~LOC~~ SOLN
40.0000 mg | SUBCUTANEOUS | Status: DC
  Administered 2018-12-25 – 2018-12-29 (×5): 40 mg via SUBCUTANEOUS
  Filled 2018-12-25 (×5): qty 0.4

## 2018-12-25 MED ORDER — SODIUM CHLORIDE 0.9 % IV SOLN
500.0000 mg | Freq: Once | INTRAVENOUS | Status: AC
  Administered 2018-12-25: 500 mg via INTRAVENOUS
  Filled 2018-12-25: qty 500

## 2018-12-25 MED ORDER — ALBUTEROL (5 MG/ML) CONTINUOUS INHALATION SOLN
10.0000 mg/h | INHALATION_SOLUTION | RESPIRATORY_TRACT | Status: AC
  Administered 2018-12-25: 10 mg/h via RESPIRATORY_TRACT
  Filled 2018-12-25: qty 20

## 2018-12-25 MED ORDER — IPRATROPIUM-ALBUTEROL 0.5-2.5 (3) MG/3ML IN SOLN
3.0000 mL | Freq: Four times a day (QID) | RESPIRATORY_TRACT | Status: DC
  Administered 2018-12-25 (×2): 3 mL via RESPIRATORY_TRACT
  Filled 2018-12-25 (×2): qty 3

## 2018-12-25 MED ORDER — ACETAMINOPHEN 650 MG RE SUPP: 650.0000 mg | Freq: Four times a day (QID) | RECTAL | Status: DC | PRN

## 2018-12-25 MED ORDER — IPRATROPIUM-ALBUTEROL 0.5-2.5 (3) MG/3ML IN SOLN
3.0000 mL | Freq: Three times a day (TID) | RESPIRATORY_TRACT | Status: DC
  Administered 2018-12-26 – 2018-12-28 (×8): 3 mL via RESPIRATORY_TRACT
  Filled 2018-12-25 (×8): qty 3

## 2018-12-25 MED ORDER — AZITHROMYCIN 250 MG PO TABS
500.0000 mg | ORAL_TABLET | ORAL | Status: DC
  Administered 2018-12-26 – 2018-12-28 (×3): 500 mg via ORAL
  Filled 2018-12-25 (×3): qty 2

## 2018-12-25 MED ORDER — SODIUM CHLORIDE 0.9 % IV SOLN
INTRAVENOUS | Status: DC | PRN
  Administered 2018-12-25: 500 mL via INTRAVENOUS

## 2018-12-25 MED ORDER — SODIUM CHLORIDE 0.9 % IV SOLN
1.0000 g | INTRAVENOUS | Status: DC
  Administered 2018-12-26 – 2018-12-29 (×4): 1 g via INTRAVENOUS
  Filled 2018-12-25 (×4): qty 1
  Filled 2018-12-25: qty 10

## 2018-12-25 MED ORDER — ATORVASTATIN CALCIUM 40 MG PO TABS
40.0000 mg | ORAL_TABLET | Freq: Every day | ORAL | Status: DC
  Administered 2018-12-26 – 2018-12-29 (×4): 40 mg via ORAL
  Filled 2018-12-25 (×4): qty 1

## 2018-12-25 MED ORDER — POLYETHYLENE GLYCOL 3350 17 G PO PACK
17.0000 g | PACK | Freq: Every day | ORAL | Status: DC | PRN
  Administered 2018-12-26: 17 g via ORAL
  Filled 2018-12-25: qty 1

## 2018-12-25 MED ORDER — ACETAMINOPHEN 325 MG PO TABS
650.0000 mg | ORAL_TABLET | Freq: Four times a day (QID) | ORAL | Status: DC | PRN
  Administered 2018-12-25 – 2018-12-30 (×5): 650 mg via ORAL
  Filled 2018-12-25 (×5): qty 2

## 2018-12-25 MED ORDER — SODIUM CHLORIDE 0.9 % IV SOLN
1.0000 g | Freq: Once | INTRAVENOUS | Status: AC
  Administered 2018-12-25: 1 g via INTRAVENOUS
  Filled 2018-12-25: qty 10

## 2018-12-25 MED ORDER — TRAZODONE HCL 50 MG PO TABS
25.0000 mg | ORAL_TABLET | Freq: Every evening | ORAL | Status: DC | PRN
  Administered 2018-12-26 – 2018-12-30 (×4): 50 mg via ORAL
  Filled 2018-12-25 (×5): qty 1

## 2018-12-25 NOTE — ED Notes (Signed)
ED TO INPATIENT HANDOFF REPORT  Name/Age/Gender Tammy Ball 78 y.o. female  Code Status Code Status History    Date Active Date Inactive Code Status Order ID Comments User Context   01/23/2018 1818 01/25/2018 1724 Full Code [id number]  Roxan Hockey, MD ED   09/19/2015 1447 09/21/2015 1331 Full Code [id number]  Domenic Polite, MD Inpatient   09/08/2015 1419 09/13/2015 1747 Full Code [id number]  Hosie Poisson, MD Inpatient      Home/SNF/Other Home  Chief Complaint SOB  Level of Care/Admitting Diagnosis ED Disposition    ED Disposition Condition [facility] Area: [facility] [100102]  Level of Care: Telemetry [5]  Admit to tele based on following criteria: Other see comments  Comments: COPD exacerbation with pneumonia  Diagnosis: COPD exacerbation [facility]) [[id number]]  Admitting Physician: Elodia Florence [phone]  Attending Physician: Cephus Slater, A CALDWELL [phone]  PT Class (Do Not Modify): Observation [104]  PT Acc Code (Do Not Modify): Observation [10022]       Medical History Past Medical History:  Diagnosis Date  . Anemia    was while doing chemo  . Anxiety   . Asthma   . Chemotherapy-induced neuropathy ([place]) 11/01/2015  . Claustrophobia   . COPD (chronic obstructive pulmonary disease) ([place])   . Depression   . Encounter for antineoplastic chemotherapy 02/09/2016  . Glaucoma   . Headache    prior to menopause  . Heart murmur   . History of echocardiogram    Echo 2/17: EF 99-24%, grade 1 diastolic dysfunction, mild MR, trivial pericardial effusion  . History of nuclear stress test    Myoview 2/17: no ischemia or scar, EF 79%; low risk  . History of radiation therapy 10/30/16-11/06/16  . Hyperlipidemia   . Hypertension   . Insomnia 05/16/2016  . Non-small cell carcinoma of right lung, stage 2 ([place]) 10/03/2015  . Radiation 02/29/16-04/10/16   50.4 Gy to right central chest  . Renal cell carcinoma ([facility])    L nephrectomy  in  2012    Allergies Allergies  Allergen Reactions  . Bee Venom Anaphylaxis, Shortness Of Breath, Swelling and Other (See Comments)    Swelling at site   . Amlodipine Swelling and Other (See Comments)    Swelling of the ankles and hands   . Levofloxacin Other (See Comments)    Joint pain   . Hctz [Hydrochlorothiazide] Palpitations and Other (See Comments)    Sweating     IV Location/Drains/Wounds Patient Lines/Drains/Airways Status   Active Line/Drains/Airways    Name:   Placement date:   Placement time:   Site:   Days:   Peripheral IV 12/25/18 Antecubital   12/25/18    1302    Antecubital   less than 1   Incision (Closed) 01/18/16 Chest Right   01/18/16    1323     1072   Incision (Closed) 01/31/17 Back Upper   01/31/17    0937     693          Labs/Imaging Results for orders placed or performed during the hospital encounter of 12/25/18 (from the past 48 hour(s))  Basic metabolic panel     Status: Abnormal   Collection Time: 12/25/18  1:02 PM  Result Value Ref Range   Sodium 132 (L) 135 - 145 mmol/L   Potassium 4.6 3.5 - 5.1 mmol/L   Chloride 96 (L) 98 - 111 mmol/L   CO2 27 22 - 32 mmol/L  Glucose, Bld 109 (H) 70 - 99 mg/dL   BUN 18 8 - 23 mg/dL   Creatinine, Ser 1.13 (H) 0.44 - 1.00 mg/dL   Calcium 9.0 8.9 - 10.3 mg/dL   GFR calc non Af Amer 48 (L) >60 mL/min   GFR calc Af Amer 55 (L) >60 mL/min   Anion gap 9 5 - 15    Comment: Performed at [facility], [place] [address] [place], [place] [address]  CBC with Differential     Status: Abnormal   Collection Time: 12/25/18  1:02 PM  Result Value Ref Range   WBC 13.8 (H) 4.0 - 10.5 K/uL   RBC 3.91 3.87 - 5.11 MIL/uL   Hemoglobin 12.3 12.0 - 15.0 g/dL   HCT 39.2 36.0 - 46.0 %   MCV 100.3 (H) 80.0 - 100.0 fL   MCH 31.5 26.0 - 34.0 pg   MCHC 31.4 30.0 - 36.0 g/dL   RDW 15.4 11.5 - 15.5 %   Platelets 300 150 - 400 K/uL   nRBC 0.0 0.0 - 0.2 %   Neutrophils Relative % 85 %   Neutro Abs 11.7 (H) 1.7 -  7.7 K/uL   Lymphocytes Relative 7 %   Lymphs Abs 1.0 0.7 - 4.0 K/uL   Monocytes Relative 7 %   Monocytes Absolute 0.9 0.1 - 1.0 K/uL   Eosinophils Relative 0 %   Eosinophils Absolute 0.0 0.0 - 0.5 K/uL   Basophils Relative 0 %   Basophils Absolute 0.0 0.0 - 0.1 K/uL   Immature Granulocytes 1 %   Abs Immature Granulocytes 0.10 (H) 0.00 - 0.07 K/uL    Comment: Performed at [facility], [place] [address] [place], [place] [address]  Urinalysis, Routine w reflex microscopic     Status: Abnormal   Collection Time: 12/25/18  1:20 PM  Result Value Ref Range   Color, Urine YELLOW YELLOW   APPearance CLEAR CLEAR   Specific Gravity, Urine 1.016 1.005 - 1.030   pH 5.0 5.0 - 8.0   Glucose, UA NEGATIVE NEGATIVE mg/dL   Hgb urine dipstick NEGATIVE NEGATIVE   Bilirubin Urine NEGATIVE NEGATIVE   Ketones, ur NEGATIVE NEGATIVE mg/dL   Protein, ur NEGATIVE NEGATIVE mg/dL   Nitrite NEGATIVE NEGATIVE   Leukocytes,Ua MODERATE (A) NEGATIVE   RBC / HPF 0-5 0 - 5 RBC/hpf   WBC, UA 6-10 0 - 5 WBC/hpf   Bacteria, UA NONE SEEN NONE SEEN   Squamous Epithelial / LPF 0-5 0 - 5   Hyaline Casts, UA PRESENT     Comment: Performed at [facility], [place] [address], [place], [place] [address]   Dg Chest 2 View  Result Date: 12/25/2018 CLINICAL DATA:  Patient here from home with complaints of SOB since last Tuesday. Lung cancer patient. Reports that dr place her on portable O2 on Tuesday. Increase in cough. Productive. Hx COPD, HTN, renal cell carcinoma. EXAM: CHEST - 2 VIEW COMPARISON:  CT of the chest on 11/25/2018, chest x-ray on 02/19/2018 FINDINGS: Lungs are hyperinflated with. The heart size is normal. No pulmonary edema. No focal consolidations. Focal streaky opacity at the posterior aspect of the lung likely represents atelectasis or early infiltrates in both lung bases. There is mild perihilar peribronchial thickening. Remote UPPER thoracic vertebral augmentation. Remote  cervical spine fusion. IMPRESSION: 1. Hyperinflation and bronchitic changes. 2. Bibasilar atelectasis or early infiltrates. Electronically Signed   By: Nolon Nations M.D.   On: 12/25/2018 13:43   None  Pending Labs Unresulted Labs (From admission, onward)    Start     Ordered   12/25/18 1434  Culture, blood (routine x 2)  BLOOD CULTURE X 2,   R     12/25/18 1433   12/25/18 1434  Expectorated sputum assessment w rflx to resp cult  Once,   R     12/25/18 1433   12/25/18 1433  Influenza panel by PCR (type A & B)  (Influenza PCR Panel)  Once,   R     12/25/18 1432   Signed and Held  CBC  (enoxaparin (LOVENOX)    CrCl >/= 30 ml/min)  Once,   R    Comments:  Baseline for enoxaparin therapy IF NOT ALREADY DRAWN.  Notify MD if PLT < 100 K.    Signed and Held   Signed and Held  Creatinine, serum  (enoxaparin (LOVENOX)    CrCl >/= 30 ml/min)  Once,   R    Comments:  Baseline for enoxaparin therapy IF NOT ALREADY DRAWN.    Signed and Held   Signed and Held  Creatinine, serum  (enoxaparin (LOVENOX)    CrCl >/= 30 ml/min)  Weekly,   R    Comments:  while on enoxaparin therapy    Signed and Held   Signed and Held  Comprehensive metabolic panel  Tomorrow morning,   R     Signed and Held   Signed and Held  CBC  Tomorrow morning,   R     Signed and Held          Vitals/Pain Today's Vitals   12/25/18 1313 12/25/18 1337 12/25/18 1400 12/25/18 1430  BP: (!) 149/78 133/72 121/70 123/79  Pulse: 94 (!) 29 93   Resp: 17 (!) 24 11 14   Temp:      TempSrc:      SpO2: 97% (!) 79% 96%   Weight:      Height:      PainSc:        Isolation Precautions Droplet precaution  Medications Medications  albuterol (PROVENTIL,VENTOLIN) solution continuous neb (0 mg/hr Nebulization Stopped 12/25/18 1415)  cefTRIAXone (ROCEPHIN) 1 g in sodium chloride 0.9 % 100 mL IVPB (has no administration in time range)  azithromycin (ZITHROMAX) 500 mg in sodium chloride 0.9 % 250 mL IVPB (has no administration in  time range)  predniSONE (DELTASONE) tablet 40 mg (has no administration in time range)  0.9 %  sodium chloride infusion (has no administration in time range)    Mobility walks with person assist

## 2018-12-25 NOTE — H&P (Signed)
History and Physical    [name] ERX:[id number] DOB: 27-May-1941 DOA: 12/25/2018  PCP: Forrest Moron, MD  Patient coming from: home  I have personally briefly reviewed patient's old medical records in [place]  Chief Complaint: cough, SOB  HPI: Tammy Ball is [name] 78 y.o. female with medical history significant of COPD, lung cancer, renal cell carcinoma, hypertension, hyperlipidemia who presents with persistent cough and shortness of breath.   She notes her symptoms started Tuesday of last week.  At that point in time she felt sick with Emet Rafanan sore throat.  She notes Nayali Talerico cough as well.  She went to the doctor's office who sent test that showed Linden Tagliaferro negative flu and strep.  She was told that it was likely Zannie Locastro viral illness.  She initially thought she was getting better, but then developed fever with worsening shortness of breath.  She tried Dearra Myhand rescue inhaler but that did not help.  She came here to the hospital.  She endorses fevers and chills.  No body aches.  No sore throat.  She notes productive cough.  She denies any chest pain.  She notes shortness of breath worse with exertion.  She denies any nausea, vomiting or abdominal pain.  She notes wheezing.    ED Course: labs, cxr, abx, admit for COPD exacerbation  Review of Systems: As per HPI otherwise 10 point review of systems negative.   Past Medical History:  Diagnosis Date  . Anemia    was while doing chemo  . Anxiety   . Asthma   . Chemotherapy-induced neuropathy ([place]) 11/01/2015  . Claustrophobia   . COPD (chronic obstructive pulmonary disease) ([place])   . Depression   . Encounter for antineoplastic chemotherapy 02/09/2016  . Glaucoma   . Headache    prior to menopause  . Heart murmur   . History of echocardiogram    Echo 2/17: EF 95-09%, grade 1 diastolic dysfunction, mild MR, trivial pericardial effusion  . History of nuclear stress test    Myoview 2/17: no ischemia or scar, EF 79%; low risk  . History of radiation  therapy 10/30/16-11/06/16  . Hyperlipidemia   . Hypertension   . Insomnia 05/16/2016  . Non-small cell carcinoma of right lung, stage 2 ([place]) 10/03/2015  . Radiation 02/29/16-04/10/16   50.4 Gy to right central chest  . Renal cell carcinoma ([place])    L nephrectomy  in 2012    Past Surgical History:  Procedure Laterality Date  . BACK SURGERY     cervical 1991  . EYE SURGERY    . IR GENERIC HISTORICAL  01/16/2017   IR RADIOLOGIST EVAL & MGMT 01/16/2017 [facility]  . IR GENERIC HISTORICAL  01/31/2017   IR FLUORO GUIDED NEEDLE PLC ASPIRATION/INJECTION LOC 01/31/2017 Luanne Bras, MD [facility]  . IR GENERIC HISTORICAL  01/31/2017   IR VERTEBROPLASTY CERV/THOR BX INC UNI/BIL INC/INJECT/IMAGING 01/31/2017 Luanne Bras, MD [facility]  . IR GENERIC HISTORICAL  01/31/2017   IR VERTEBROPLASTY EA ADDL (T&LS) BX INC UNI/BIL INC INJECT/IMAGING 01/31/2017 Luanne Bras, MD [facility]  . kidney cancer    . NEPHRECTOMY    . SPINE SURGERY    . TUBAL LIGATION    . VIDEO ASSISTED THORACOSCOPY (VATS)/WEDGE RESECTION Right 01/18/2016   Procedure: VIDEO ASSISTED THORACOSCOPY (VATS)/LUNG RESECTION, THOROCOTOMY, RIGHT UPPER LOBECTOMY, LYMPH NODE DISSECTION, PLACEMENT OF ON Q;  Surgeon: [name], MD;  Location: [facility];  Service: Thoracic;  Laterality: Right;  Marland Kitchen VIDEO BRONCHOSCOPY Bilateral  09/20/2015   Procedure: VIDEO BRONCHOSCOPY WITHOUT FLUORO;  Surgeon: Rigoberto Noel, MD;  Location: [facility];  Service: Cardiopulmonary;  Laterality: Bilateral;  . VIDEO BRONCHOSCOPY N/Coury Grieger 01/18/2016   Procedure: VIDEO BRONCHOSCOPY;  Surgeon: [name], MD;  Location: [facility];  Service: Thoracic;  Laterality: N/Simone Tuckey;     reports that she quit smoking about 7 years ago. Her smoking use included cigarettes. She has Zadyn Yardley 25.00 pack-year smoking history. She has never used smokeless tobacco. She reports that she does not drink alcohol or use drugs.  Allergies  Allergen Reactions  . Bee Venom Anaphylaxis,  Shortness Of Breath, Swelling and Other (See Comments)    Swelling at site   . Amlodipine Swelling and Other (See Comments)    Swelling of the ankles and hands   . Levofloxacin Other (See Comments)    Joint pain   . Hctz [Hydrochlorothiazide] Palpitations and Other (See Comments)    Sweating     Family History  Problem Relation Age of Onset  . Heart disease Sister   . Obesity Brother   . Heart attack Daughter 80       s/p CABG  . Glaucoma Daughter   . Breast cancer Sister   . Birth defects Sister   . Cancer Mother        Bladder Cancer  . Hypertension Mother   . CAD Mother 71  . Cancer Maternal Grandmother    Unacceptable: Noncontributory, unremarkable, or negative. Acceptable: Family history reviewed and not pertinent (If you reviewed it)  Prior to Admission medications   Medication Sig Start Date End Date Taking? Authorizing Provider  acetaminophen (TYLENOL) 500 MG tablet Take 500 mg by mouth every 6 (six) hours as needed for mild pain, moderate pain, fever or headache. Reported on 05/24/2016   Yes [provider]  albuterol (VENTOLIN HFA) 108 (90 Base) MCG/ACT inhaler Inhale 1-2 puffs into the lungs every 6 (six) hours as needed for wheezing or shortness of breath. 12/20/18  Yes Azzie Glatter, FNP  Ascorbic Acid (VITAMIN C) 1000 MG tablet Take 1,000 mg by mouth daily.   Yes [provider]  aspirin EC 81 MG tablet Take 81 mg by mouth daily. Reported on 04/11/2016   Yes [provider]  atorvastatin (LIPITOR) 40 MG tablet TAKE ONE TABLET BY MOUTH ONCE DAILY. 11/21/18  Yes Delia Chimes Franki Alcaide, MD  calcium carbonate (TUMS EX) 750 MG chewable tablet Chew 1 tablet by mouth as needed for heartburn.   Yes [provider]  Carboxymethylcellulose Sodium (ARTIFICIAL TEARS OP) Place 1 drop into both eyes daily as needed (dry eyes).   Yes [provider]  Cholecalciferol (VITAMIN D3 PO) Take 1,200 mg by mouth daily.   Yes [provider]    fish oil-omega-3 fatty acids 1000 MG capsule Take 1 capsule by mouth daily.    Yes [provider]  fluticasone (FLONASE) 50 MCG/ACT nasal spray Place 1 spray into both nostrils daily.   Yes [provider]  Fluticasone-Salmeterol (ADVAIR) 250-50 MCG/DOSE AEPB Inhale 1 puff into the lungs 2 (two) times daily. 11/21/18  Yes Delia Chimes Markavious Micco, MD  metoprolol succinate (TOPROL-XL) 50 MG 24 hr tablet Take 1 tablet (50 mg total) by mouth daily. Take with or immediately following Wynelle Dreier meal. 11/21/18  Yes Stallings, Zoe Sharley Keeler, MD  sodium chloride (OCEAN) 0.65 % SOLN nasal spray Place 1 spray into both nostrils as needed for congestion.   Yes [provider]  tizanidine (ZANAFLEX) 2 MG  capsule Take 1 capsule (2 mg total) by mouth 3 (three) times daily as needed for muscle spasms. 11/21/18  Yes Forrest Moron, MD  traMADol (ULTRAM) 50 MG tablet Take 1 tablet (50 mg total) by mouth every 6 (six) hours as needed (BACK PAIN). 11/21/18  Yes Forrest Moron, MD  traZODone (DESYREL) 50 MG tablet Take 0.5-1 tablets (25-50 mg total) by mouth at bedtime as needed for sleep. 11/21/18  Yes Forrest Moron, MD  alendronate (FOSAMAX) 70 MG tablet Take 1 tablet (70 mg total) by mouth every 7 (seven) days. Take with Deveion Denz full glass of water on an empty stomach. 11/21/18   Forrest Moron, MD    Physical Exam: Vitals:   12/25/18 1700 12/25/18 1727 12/25/18 1736 12/25/18 1737  BP:      Pulse:      Resp:      Temp:      TempSrc:      SpO2: (!) 79% 92% 99% 94%  Weight:      Height:        Constitutional: NAD, calm, comfortable Vitals:   12/25/18 1700 12/25/18 1727 12/25/18 1736 12/25/18 1737  BP:      Pulse:      Resp:      Temp:      TempSrc:      SpO2: (!) 79% 92% 99% 94%  Weight:      Height:       Eyes: PERRL, lids and conjunctivae normal ENMT: Mucous membranes are moist. Posterior pharynx clear of any exudate or lesions.Normal dentition.  Neck: normal, supple, no masses, no  thyromegaly Respiratory: mildly increased wob, expiratory wheezing, mild  Cardiovascular: Regular rate and rhythm, no murmurs / rubs / gallops. No extremity edema. 2+ pedal pulses. No carotid bruits.  Abdomen: no tenderness, no masses palpated. No hepatosplenomegaly. Bowel sounds positive.  Musculoskeletal: no clubbing / cyanosis. No joint deformity upper and lower extremities. Good ROM, no contractures. Normal muscle tone.  Skin: no rashes, lesions, ulcers. No induration Neurologic: CN 2-12 grossly intact. Sensation intact. Psychiatric: Normal judgment and insight. Alert and oriented x 3. Normal mood.   Labs on Admission: I have personally reviewed following labs and imaging studies  CBC: Recent Labs  Lab 12/25/18 1302  WBC 13.8*  NEUTROABS 11.7*  HGB 12.3  HCT 39.2  MCV 100.3*  PLT 353   Basic Metabolic Panel: Recent Labs  Lab 12/25/18 1302  NA 132*  K 4.6  CL 96*  CO2 27  GLUCOSE 109*  BUN 18  CREATININE 1.13*  CALCIUM 9.0   GFR: Estimated Creatinine Clearance: 43.9 mL/min (Prospero Mahnke) (by C-G formula based on SCr of 1.13 mg/dL (H)). Liver Function Tests: No results for input(s): AST, ALT, ALKPHOS, BILITOT, PROT, ALBUMIN in the last 168 hours. No results for input(s): LIPASE, AMYLASE in the last 168 hours. No results for input(s): AMMONIA in the last 168 hours. Coagulation Profile: No results for input(s): INR, PROTIME in the last 168 hours. Cardiac Enzymes: No results for input(s): CKTOTAL, CKMB, CKMBINDEX, TROPONINI in the last 168 hours. BNP (last 3 results) No results for input(s): PROBNP in the last 8760 hours. HbA1C: No results for input(s): HGBA1C in the last 72 hours. CBG: No results for input(s): GLUCAP in the last 168 hours. Lipid Profile: No results for input(s): CHOL, HDL, LDLCALC, TRIG, CHOLHDL, LDLDIRECT in the last 72 hours. Thyroid Function Tests: No results for input(s): TSH, T4TOTAL, FREET4, T3FREE, THYROIDAB in the last 72 hours. Anemia Panel:  No  results for input(s): VITAMINB12, FOLATE, FERRITIN, TIBC, IRON, RETICCTPCT in the last 72 hours. Urine analysis:    Component Value Date/Time   COLORURINE YELLOW 12/25/2018 1320   APPEARANCEUR CLEAR 12/25/2018 1320   LABSPEC 1.016 12/25/2018 1320   LABSPEC 1.010 10/18/2015 0959   PHURINE 5.0 12/25/2018 1320   GLUCOSEU NEGATIVE 12/25/2018 1320   GLUCOSEU Negative 10/18/2015 0959   HGBUR NEGATIVE 12/25/2018 1320   BILIRUBINUR NEGATIVE 12/25/2018 1320   BILIRUBINUR negative 02/19/2018 0904   BILIRUBINUR Negative 10/18/2015 0959   KETONESUR NEGATIVE 12/25/2018 1320   PROTEINUR NEGATIVE 12/25/2018 1320   UROBILINOGEN 0.2 02/19/2018 0904   UROBILINOGEN 0.2 10/18/2015 0959   NITRITE NEGATIVE 12/25/2018 1320   LEUKOCYTESUR MODERATE (Derran Sear) 12/25/2018 1320   LEUKOCYTESUR Negative 10/18/2015 0959    Radiological Exams on Admission: Dg Chest 2 View  Result Date: 12/25/2018 CLINICAL DATA:  Patient here from home with complaints of SOB since last Tuesday. Lung cancer patient. Reports that dr place her on portable O2 on Tuesday. Increase in cough. Productive. Hx COPD, HTN, renal cell carcinoma. EXAM: CHEST - 2 VIEW COMPARISON:  CT of the chest on 11/25/2018, chest x-ray on 02/19/2018 FINDINGS: Lungs are hyperinflated with. The heart size is normal. No pulmonary edema. No focal consolidations. Focal streaky opacity at the posterior aspect of the lung likely represents atelectasis or early infiltrates in both lung bases. There is mild perihilar peribronchial thickening. Remote UPPER thoracic vertebral augmentation. Remote cervical spine fusion. IMPRESSION: 1. Hyperinflation and bronchitic changes. 2. Bibasilar atelectasis or early infiltrates. Electronically Signed   By: Nolon Nations M.D.   On: 12/25/2018 13:43    EKG: Independently reviewed. pending  Assessment/Plan Active Problems:   COPD exacerbation (HCC)  COPD Exacerbation  Possible Community Acquired Pneumonia:  Ceftriaxone,  azithromycin Steroids, scheduled, and prn nebs Negative influenza Follow blood cx, sputum cx, urine strep Wean O2 as tolerated (uses 2 L with exertion at home)  Hypertension: continue metoprolol  Hyperlipidemia: continue atorvastatin  Hx Lung Cancer:  S/p chemoradiation, right upper lobe resection. Currently following with Dr. Julien Nordmann for observation.  Hx Renal Cell Carcinoma s/p L nephrectomy  DVT prophylaxis: lovenox  Code Status: full  Family Communication: none at bedside  Disposition Plan: pending  Consults called: none  Admission status: observation    Fayrene Helper MD [facility] Pager AMION  If 7PM-7AM, please contact night-coverage [email or web address] Password [facility]  12/25/2018, 7:51 PM

## 2018-12-25 NOTE — ED Notes (Signed)
Second PIV unable to be obtained at this time. IV antibiotics infusing with NS bolus to right AC PIV

## 2018-12-25 NOTE — ED Provider Notes (Signed)
[facility] DEPT Provider Note   CSN: [id number] Arrival date & time: 12/25/18  1131     History   Chief Complaint Chief Complaint  Patient presents with  . Shortness of Breath    HPI Tammy Ball is a 78 y.o. female.  HPI Patient presents to the emergency department with worsening shortness of breath over the last 2 days.  The patient states he was seen this past Saturday and was flu negative and had a negative strep test.  She was advised she has a virus and to use symptomatic relief.  The patient states she got worse and had a fever of 101 earlier this morning.  Patient states that seems to make the condition better or worse.  The patient denies chest pain, headache,blurred vision, neck pain,  weakness, numbness, dizziness, anorexia, edema, abdominal pain, nausea, vomiting, diarrhea, rash, back pain, dysuria, hematemesis, bloody stool, near syncope, or syncope. Past Medical History:  Diagnosis Date  . Anemia    was while doing chemo  . Anxiety   . Asthma   . Chemotherapy-induced neuropathy ([place]) 11/01/2015  . Claustrophobia   . COPD (chronic obstructive pulmonary disease) ([place])   . Depression   . Encounter for antineoplastic chemotherapy 02/09/2016  . Glaucoma   . Headache    prior to menopause  . Heart murmur   . History of echocardiogram    Echo 2/17: EF 38-18%, grade 1 diastolic dysfunction, mild MR, trivial pericardial effusion  . History of nuclear stress test    Myoview 2/17: no ischemia or scar, EF 79%; low risk  . History of radiation therapy 10/30/16-11/06/16  . Hyperlipidemia   . Hypertension   . Insomnia 05/16/2016  . Non-small cell carcinoma of right lung, stage 2 ([place]) 10/03/2015  . Radiation 02/29/16-04/10/16   50.4 Gy to right central chest  . Renal cell carcinoma ([place])    L nephrectomy  in 2012    Patient Active Problem List   Diagnosis Date Noted  . Age-related osteoporosis with current pathological fracture  11/21/2018  . Renal insufficiency 05/20/2018  . History of compression fracture of spine 12/10/2016  . Hypercalcemia 06/25/2016  . Insomnia 05/16/2016  . Pneumonitis, radiation ([place]) 04/11/2016  . S/P lobectomy of lung 01/18/2016  . Chemotherapy-induced neuropathy ([place]) 11/01/2015  . Non-small cell carcinoma of right lung, stage 2 ([place]) 10/03/2015  . Morbid obesity ([place]) 10/01/2015  . Hyperlipidemia 08/14/2013  . Spondylolisthesis of lumbar region 07/03/2013  . Glaucoma 10/23/2012  . Essential hypertension 12/17/2011  . COPD GOLD II  12/17/2011  . Renal cell cancer ([place]) 12/17/2011  . Sciatica of left side 12/17/2011  . Depression 12/17/2011    Past Surgical History:  Procedure Laterality Date  . BACK SURGERY     cervical 1991  . EYE SURGERY    . IR GENERIC HISTORICAL  01/16/2017   IR RADIOLOGIST EVAL & MGMT 01/16/2017 [facility]  . IR GENERIC HISTORICAL  01/31/2017   IR FLUORO GUIDED NEEDLE PLC ASPIRATION/INJECTION LOC 01/31/2017 Luanne Bras, MD [facility]  . IR GENERIC HISTORICAL  01/31/2017   IR VERTEBROPLASTY CERV/THOR BX INC UNI/BIL INC/INJECT/IMAGING 01/31/2017 Luanne Bras, MD [facility]  . IR GENERIC HISTORICAL  01/31/2017   IR VERTEBROPLASTY EA ADDL (T&LS) BX INC UNI/BIL INC INJECT/IMAGING 01/31/2017 Luanne Bras, MD [facility]  . kidney cancer    . NEPHRECTOMY    . SPINE SURGERY    . TUBAL LIGATION    . VIDEO ASSISTED THORACOSCOPY (VATS)/WEDGE RESECTION Right  01/18/2016   Procedure: VIDEO ASSISTED THORACOSCOPY (VATS)/LUNG RESECTION, THOROCOTOMY, RIGHT UPPER LOBECTOMY, LYMPH NODE DISSECTION, PLACEMENT OF ON Q;  Surgeon: [name], MD;  Location: [facility];  Service: Thoracic;  Laterality: Right;  Marland Kitchen VIDEO BRONCHOSCOPY Bilateral 09/20/2015   Procedure: VIDEO BRONCHOSCOPY WITHOUT FLUORO;  Surgeon: Rigoberto Noel, MD;  Location: [facility];  Service: Cardiopulmonary;  Laterality: Bilateral;  . VIDEO BRONCHOSCOPY N/A 01/18/2016   Procedure: VIDEO  BRONCHOSCOPY;  Surgeon: [name], MD;  Location[facility];  Service: Thoracic;  Laterality: N/A;     OB History   No obstetric history on file.      Home Medications    Prior to Admission medications   Medication Sig Start Date End Date Taking? Authorizing Provider  acetaminophen (TYLENOL) 500 MG tablet Take 500 mg by mouth every 6 (six) hours as needed for mild pain, moderate pain, fever or headache. Reported on 05/24/2016   Yes [provider]  albuterol (VENTOLIN HFA) 108 (90 Base) MCG/ACT inhaler Inhale 1-2 puffs into the lungs every 6 (six) hours as needed for wheezing or shortness of breath. 12/20/18  Yes Azzie Glatter, FNP  Ascorbic Acid (VITAMIN C) 1000 MG tablet Take 1,000 mg by mouth daily.   Yes [provider]  aspirin EC 81 MG tablet Take 81 mg by mouth daily. Reported on 04/11/2016   Yes [provider]  atorvastatin (LIPITOR) 40 MG tablet TAKE ONE TABLET BY MOUTH ONCE DAILY. 11/21/18  Yes Delia Chimes A, MD  calcium carbonate (TUMS EX) 750 MG chewable tablet Chew 1 tablet by mouth as needed for heartburn.   Yes [provider]  Carboxymethylcellulose Sodium (ARTIFICIAL TEARS OP) Place 1 drop into both eyes daily as needed (dry eyes).   Yes [provider]  Cholecalciferol (VITAMIN D3 PO) Take 1,200 mg by mouth daily.   Yes [provider]  fish oil-omega-3 fatty acids 1000 MG capsule Take 1 capsule by mouth daily.    Yes [provider]  fluticasone (FLONASE) 50 MCG/ACT nasal spray Place 1 spray into both nostrils daily.   Yes [provider]  Fluticasone-Salmeterol (ADVAIR) 250-50 MCG/DOSE AEPB Inhale 1 puff into the lungs 2 (two) times daily. 11/21/18  Yes Delia Chimes A, MD  metoprolol succinate (TOPROL-XL) 50 MG 24 hr tablet Take 1 tablet (50 mg total) by mouth daily. Take with or immediately following a meal. 11/21/18  Yes Stallings, Zoe A, MD  sodium chloride (OCEAN) 0.65 % SOLN nasal spray  Place 1 spray into both nostrils as needed for congestion.   Yes [provider]  tizanidine (ZANAFLEX) 2 MG capsule Take 1 capsule (2 mg total) by mouth 3 (three) times daily as needed for muscle spasms. 11/21/18  Yes Forrest Moron, MD  traMADol (ULTRAM) 50 MG tablet Take 1 tablet (50 mg total) by mouth every 6 (six) hours as needed (BACK PAIN). 11/21/18  Yes Forrest Moron, MD  traZODone (DESYREL) 50 MG tablet Take 0.5-1 tablets (25-50 mg total) by mouth at bedtime as needed for sleep. 11/21/18  Yes Forrest Moron, MD  alendronate (FOSAMAX) 70 MG tablet Take 1 tablet (70 mg total) by mouth every 7 (seven) days. Take with a full glass of water on an empty stomach. 11/21/18   Forrest Moron, MD    Family History Family History  Problem Relation Age of Onset  . Heart disease Sister   . Obesity Brother   . Heart attack Daughter 66  s/p CABG  . Glaucoma Daughter   . Breast cancer Sister   . Birth defects Sister   . Cancer Mother        Bladder Cancer  . Hypertension Mother   . CAD Mother 60  . Cancer Maternal Grandmother     Social History Social History   Tobacco Use  . Smoking status: Former Smoker    Packs/day: 0.50    Years: 50.00    Pack years: 25.00    Types: Cigarettes    Last attempt to quit: 03/16/2011    Years since quitting: 7.7  . Smokeless tobacco: Never Used  Substance Use Topics  . Alcohol use: No    Alcohol/week: 0.0 standard drinks  . Drug use: No     Allergies   Bee venom; Amlodipine; Levofloxacin; and Hctz [hydrochlorothiazide]   Review of Systems Review of Systems All other systems negative except as documented in the HPI. All pertinent positives and negatives as reviewed in the HPI. Physical Exam Updated Vital Signs BP 133/72   Pulse (!) 29   Temp 98.2 F (36.8 C) (Oral)   Resp (!) 24   Ht 5\' 4"  (1.626 m)   Wt 79.4 kg   SpO2 (!) 79%   BMI 30.04 kg/m   Physical Exam Vitals signs and nursing note reviewed.    Constitutional:      General: She is not in acute distress.    Appearance: She is well-developed.  HENT:     Head: Normocephalic and atraumatic.  Eyes:     Pupils: Pupils are equal, round, and reactive to light.  Neck:     Musculoskeletal: Normal range of motion and neck supple.  Cardiovascular:     Rate and Rhythm: Normal rate and regular rhythm.     Heart sounds: Normal heart sounds. No murmur. No friction rub. No gallop.   Pulmonary:     Effort: Pulmonary effort is normal. No respiratory distress.     Breath sounds: Examination of the right-upper field reveals wheezing and rhonchi. Examination of the left-upper field reveals wheezing and rhonchi. Examination of the right-middle field reveals wheezing and rhonchi. Examination of the left-middle field reveals wheezing and rhonchi. Examination of the right-lower field reveals wheezing and rhonchi. Examination of the left-lower field reveals wheezing and rhonchi. Wheezing and rhonchi present. No rales.  Abdominal:     General: Bowel sounds are normal. There is no distension.     Palpations: Abdomen is soft.     Tenderness: There is no abdominal tenderness.  Skin:    General: Skin is warm and dry.     Capillary Refill: Capillary refill takes less than 2 seconds.     Findings: No erythema or rash.  Neurological:     Mental Status: She is alert and oriented to person, place, and time.     Motor: No abnormal muscle tone.     Coordination: Coordination normal.  Psychiatric:        Behavior: Behavior normal.      ED Treatments / Results  Labs (all labs ordered are listed, but only abnormal results are displayed) Labs Reviewed  BASIC METABOLIC PANEL - Abnormal; Notable for the following components:      Result Value   Sodium 132 (*)    Chloride 96 (*)    Glucose, Bld 109 (*)    Creatinine, Ser 1.13 (*)    GFR calc non Af Amer 48 (*)    GFR calc Af Amer 55 (*)  All other components within normal limits  CBC WITH  DIFFERENTIAL/PLATELET - Abnormal; Notable for the following components:   WBC 13.8 (*)    MCV 100.3 (*)    Neutro Abs 11.7 (*)    Abs Immature Granulocytes 0.10 (*)    All other components within normal limits  URINALYSIS, ROUTINE W REFLEX MICROSCOPIC - Abnormal; Notable for the following components:   Leukocytes,Ua MODERATE (*)    All other components within normal limits    EKG None  Radiology Dg Chest 2 View  Result Date: 12/25/2018 CLINICAL DATA:  Patient here from home with complaints of SOB since last Tuesday. Lung cancer patient. Reports that dr place her on portable O2 on Tuesday. Increase in cough. Productive. Hx COPD, HTN, renal cell carcinoma. EXAM: CHEST - 2 VIEW COMPARISON:  CT of the chest on 11/25/2018, chest x-ray on 02/19/2018 FINDINGS: Lungs are hyperinflated with. The heart size is normal. No pulmonary edema. No focal consolidations. Focal streaky opacity at the posterior aspect of the lung likely represents atelectasis or early infiltrates in both lung bases. There is mild perihilar peribronchial thickening. Remote UPPER thoracic vertebral augmentation. Remote cervical spine fusion. IMPRESSION: 1. Hyperinflation and bronchitic changes. 2. Bibasilar atelectasis or early infiltrates. Electronically Signed   By: Nolon Nations M.D.   On: 12/25/2018 13:43    Procedures Procedures (including critical care time)  Medications Ordered in ED Medications  albuterol (PROVENTIL,VENTOLIN) solution continuous neb (10 mg/hr Nebulization New Bag/Given 12/25/18 1313)  cefTRIAXone (ROCEPHIN) 1 g in sodium chloride 0.9 % 100 mL IVPB (has no administration in time range)  azithromycin (ZITHROMAX) 500 mg in sodium chloride 0.9 % 250 mL IVPB (has no administration in time range)     Initial Impression / Assessment and Plan / ED Course  I have reviewed the triage vital signs and the nursing notes.  Pertinent labs & imaging results that were available during my care of the patient were  reviewed by me and considered in my medical decision making (see chart for details).     Patient has an oxygen requirement and has significant wheezing and rhonchi on examination with a questionable bilateral lower lobe infiltrate.  The patient will be treated for this and also for her oxygen requirement we will I spoke with the [facility] hospitalist who will evaluate the patient for admission.  Patient is advised the plan and all questions were answered.  Final Clinical Impressions(s) / ED Diagnoses   Final diagnoses:  None    ED Discharge Orders    None       Dalia Heading, PA-C 12/25/18 1441    Tegeler, Gwenyth Allegra, MD 12/25/18 2131

## 2018-12-25 NOTE — ED Notes (Signed)
Patient placed on continuous breathing treatment by [facility], RTT.

## 2018-12-25 NOTE — ED Notes (Signed)
This RN clarified with the [place], [place], that he wanted a continuous nebulizer instead of the protocol ordered 5mg  albuterol breathing treatment ordered in triage. Lawyer PA cancelled 5mg  albuterol treatment.

## 2018-12-25 NOTE — ED Triage Notes (Signed)
Patient here from home with complaints of SOB since last Tuesday. Lung cancer patient. Reports that dr place her on portable O2 on Tuesday. Increase in cough. Productive.

## 2018-12-26 DIAGNOSIS — Z85528 Personal history of other malignant neoplasm of kidney: Secondary | ICD-10-CM | POA: Diagnosis not present

## 2018-12-26 DIAGNOSIS — R0602 Shortness of breath: Secondary | ICD-10-CM | POA: Diagnosis not present

## 2018-12-26 DIAGNOSIS — Z888 Allergy status to other drugs, medicaments and biological substances status: Secondary | ICD-10-CM | POA: Diagnosis not present

## 2018-12-26 DIAGNOSIS — I10 Essential (primary) hypertension: Secondary | ICD-10-CM | POA: Diagnosis not present

## 2018-12-26 DIAGNOSIS — J9601 Acute respiratory failure with hypoxia: Secondary | ICD-10-CM | POA: Diagnosis not present

## 2018-12-26 DIAGNOSIS — J181 Lobar pneumonia, unspecified organism: Secondary | ICD-10-CM | POA: Diagnosis not present

## 2018-12-26 DIAGNOSIS — Z8249 Family history of ischemic heart disease and other diseases of the circulatory system: Secondary | ICD-10-CM | POA: Diagnosis not present

## 2018-12-26 DIAGNOSIS — J9611 Chronic respiratory failure with hypoxia: Secondary | ICD-10-CM | POA: Diagnosis present

## 2018-12-26 DIAGNOSIS — H409 Unspecified glaucoma: Secondary | ICD-10-CM | POA: Diagnosis present

## 2018-12-26 DIAGNOSIS — Z902 Acquired absence of lung [part of]: Secondary | ICD-10-CM | POA: Diagnosis not present

## 2018-12-26 DIAGNOSIS — R197 Diarrhea, unspecified: Secondary | ICD-10-CM | POA: Diagnosis not present

## 2018-12-26 DIAGNOSIS — Z7951 Long term (current) use of inhaled steroids: Secondary | ICD-10-CM | POA: Diagnosis not present

## 2018-12-26 DIAGNOSIS — J441 Chronic obstructive pulmonary disease with (acute) exacerbation: Secondary | ICD-10-CM | POA: Diagnosis not present

## 2018-12-26 DIAGNOSIS — Z87891 Personal history of nicotine dependence: Secondary | ICD-10-CM | POA: Diagnosis not present

## 2018-12-26 DIAGNOSIS — Z881 Allergy status to other antibiotic agents status: Secondary | ICD-10-CM | POA: Diagnosis not present

## 2018-12-26 DIAGNOSIS — E785 Hyperlipidemia, unspecified: Secondary | ICD-10-CM | POA: Diagnosis present

## 2018-12-26 DIAGNOSIS — Z7982 Long term (current) use of aspirin: Secondary | ICD-10-CM | POA: Diagnosis not present

## 2018-12-26 DIAGNOSIS — F40240 Claustrophobia: Secondary | ICD-10-CM | POA: Diagnosis present

## 2018-12-26 DIAGNOSIS — J440 Chronic obstructive pulmonary disease with acute lower respiratory infection: Secondary | ICD-10-CM | POA: Diagnosis present

## 2018-12-26 DIAGNOSIS — Z85118 Personal history of other malignant neoplasm of bronchus and lung: Secondary | ICD-10-CM | POA: Diagnosis not present

## 2018-12-26 DIAGNOSIS — Z923 Personal history of irradiation: Secondary | ICD-10-CM | POA: Diagnosis not present

## 2018-12-26 DIAGNOSIS — J189 Pneumonia, unspecified organism: Secondary | ICD-10-CM | POA: Diagnosis not present

## 2018-12-26 DIAGNOSIS — Z79899 Other long term (current) drug therapy: Secondary | ICD-10-CM | POA: Diagnosis not present

## 2018-12-26 DIAGNOSIS — Z9221 Personal history of antineoplastic chemotherapy: Secondary | ICD-10-CM | POA: Diagnosis not present

## 2018-12-26 DIAGNOSIS — Z905 Acquired absence of kidney: Secondary | ICD-10-CM | POA: Diagnosis not present

## 2018-12-26 DIAGNOSIS — Z91030 Bee allergy status: Secondary | ICD-10-CM | POA: Diagnosis not present

## 2018-12-26 LAB — CBC
HCT: 37.3 % (ref 36.0–46.0)
Hemoglobin: 11.7 g/dL — ABNORMAL LOW (ref 12.0–15.0)
MCH: 31.6 pg (ref 26.0–34.0)
MCHC: 31.4 g/dL (ref 30.0–36.0)
MCV: 100.8 fL — ABNORMAL HIGH (ref 80.0–100.0)
Platelets: 310 10*3/uL (ref 150–400)
RBC: 3.7 MIL/uL — ABNORMAL LOW (ref 3.87–5.11)
RDW: 15 % (ref 11.5–15.5)
WBC: 14.2 10*3/uL — ABNORMAL HIGH (ref 4.0–10.5)
nRBC: 0 % (ref 0.0–0.2)

## 2018-12-26 LAB — COMPREHENSIVE METABOLIC PANEL
ALT: 19 U/L (ref 0–44)
AST: 24 U/L (ref 15–41)
Albumin: 3.5 g/dL (ref 3.5–5.0)
Alkaline Phosphatase: 73 U/L (ref 38–126)
Anion gap: 10 (ref 5–15)
BUN: 16 mg/dL (ref 8–23)
CO2: 28 mmol/L (ref 22–32)
Calcium: 8.7 mg/dL — ABNORMAL LOW (ref 8.9–10.3)
Chloride: 92 mmol/L — ABNORMAL LOW (ref 98–111)
Creatinine, Ser: 0.96 mg/dL (ref 0.44–1.00)
GFR calc Af Amer: >60 mL/min (ref >60)
GFR calc non Af Amer: 58 mL/min — ABNORMAL LOW (ref >60)
Glucose, Bld: 125 mg/dL — ABNORMAL HIGH (ref 70–99)
Potassium: 4.4 mmol/L (ref 3.5–5.1)
Sodium: 130 mmol/L — ABNORMAL LOW (ref 135–145)
Total Bilirubin: 0.9 mg/dL (ref 0.3–1.2)
Total Protein: 7.4 g/dL (ref 6.5–8.1)

## 2018-12-26 MED ORDER — METOPROLOL SUCCINATE ER 25 MG PO TB24
25.0000 mg | ORAL_TABLET | Freq: Every day | ORAL | Status: DC
  Administered 2018-12-27 – 2018-12-30 (×4): 25 mg via ORAL
  Filled 2018-12-26 (×4): qty 1

## 2018-12-26 MED ORDER — PANTOPRAZOLE SODIUM 40 MG PO TBEC
40.0000 mg | DELAYED_RELEASE_TABLET | Freq: Every day | ORAL | Status: DC
  Administered 2018-12-27 – 2018-12-30 (×4): 40 mg via ORAL
  Filled 2018-12-26 (×4): qty 1

## 2018-12-26 MED ORDER — BUDESONIDE 0.25 MG/2ML IN SUSP
0.2500 mg | Freq: Two times a day (BID) | RESPIRATORY_TRACT | Status: DC
  Administered 2018-12-26 – 2018-12-29 (×6): 0.25 mg via RESPIRATORY_TRACT
  Filled 2018-12-26 (×6): qty 2

## 2018-12-26 MED ORDER — GUAIFENESIN-DM 100-10 MG/5ML PO SYRP: 5.0000 mL | ORAL_SOLUTION | ORAL | Status: DC | PRN

## 2018-12-26 MED ORDER — METHYLPREDNISOLONE SODIUM SUCC 125 MG IJ SOLR
60.0000 mg | Freq: Three times a day (TID) | INTRAMUSCULAR | Status: DC
  Administered 2018-12-26 – 2018-12-30 (×12): 60 mg via INTRAVENOUS
  Filled 2018-12-26 (×12): qty 2

## 2018-12-26 MED ORDER — GUAIFENESIN-DM 100-10 MG/5ML PO SYRP
5.0000 mL | ORAL_SOLUTION | ORAL | Status: DC | PRN
  Administered 2018-12-26 – 2018-12-30 (×10): 5 mL via ORAL
  Filled 2018-12-26 (×10): qty 10

## 2018-12-26 NOTE — Progress Notes (Signed)
[facility]                                                                              Patient Demographics  Tammy Ball, is a 78 y.o. female, DOB - October 30, 1941, [place] date - 12/25/2018   Admitting Physician A Melven Sartorius., MD  Outpatient Primary MD for the patient is Forrest Moron, MD  Outpatient specialists:   LOS - 0  days   Medical records reviewed and are as summarized below:    Chief Complaint  Patient presents with  . Shortness of Breath       Brief summary   Patient is a 78 year old female with COPD, lung CA, followed by Dr. Julien Nordmann, renal cell CA, hypertension, hyperlipidemia presented with persistent cough and shortness of breath.  Per patient symptoms started last week, felt sick with sore throat, went to her doctor's office.  Patient had negative flu test and strep test.  She was told it was likely a viral illness, then developed fever with worsening dyspnea and wheezing.   Assessment & Plan    Principal Problem:   Acute respiratory failure with hypoxia (HCC) again due to community-acquired pneumonia and COPD exacerbation -Continue IV Rocephin, Zithromax.  Influenza panel negative -Diffuse wheezing at the time of my examination, still short of breath, will continue scheduled duo nebs, add Pulmicort, flutter valve -Continue IV Rocephin, Zithromax, placed on IV Solu-Medrol 60 mg every 8 hours, antitussives -Add PPI, DC IV fluids patient is tolerating diet -Currently O2 sats 97% on 2.5 L, states not on home O2, will need home O2 evaluation at discharge  Active Problems:   Essential hypertension -BP stable, overnight was borderline.  Decrease metoprolol to 25 mg daily     Hyperlipidemia -Continue Lipitor    Non-small cell carcinoma of right lung, stage 2 ([place]) -Stage IIIa, NSCLCa, diagnosed in November 2016, has received neoadjuvant systemic chemotherapy, chemoradiation, mini thoracotomy with right upper lobectomy,  completed in 2017, currently under observation, follows Dr. Julien Nordmann  Code Status: Full CODE STATUS DVT Prophylaxis:  Lovenox  Family Communication: Discussed in detail with the patient, all imaging results, lab results explained to the patient   Disposition Plan: Will likely need 24 to 48 hours of IV steroids, IV antibiotics, inpatient treatment   Time Spent in minutes 35 minutes  Procedures:  None  Consultants:   None  Antimicrobials:   Anti-infectives (From admission, onward)   Start     Dose/Rate Route Frequency Ordered Stop   12/26/18 1600  azithromycin (ZITHROMAX) tablet 500 mg     500 mg Oral Every 24 hours 12/25/18 1951 01/01/19 1559   12/26/18 1500  cefTRIAXone (ROCEPHIN) 1 g in sodium chloride 0.9 % 100 mL IVPB     1 g 200 mL/hr over 30 Minutes Intravenous Every 24 hours 12/25/18 1951 01/01/19 1459   12/25/18 1600  azithromycin (ZITHROMAX) 500 mg in sodium chloride 0.9 % 250 mL IVPB     500 mg 250 mL/hr over 60 Minutes Intravenous  Once 12/25/18 1541 12/25/18 1717   12/25/18 1430  cefTRIAXone (ROCEPHIN) 1 g in sodium chloride 0.9 % 100 mL  IVPB     1 g 200 mL/hr over 30 Minutes Intravenous  Once 12/25/18 1423 12/25/18 1540   12/25/18 1430  azithromycin (ZITHROMAX) 500 mg in sodium chloride 0.9 % 250 mL IVPB  Status:  Discontinued     500 mg 250 mL/hr over 60 Minutes Intravenous  Once 12/25/18 1423 12/25/18 1810         Medications  Scheduled Meds: . aspirin EC  81 mg Oral Daily  . atorvastatin  40 mg Oral q1800  . azithromycin  500 mg Oral Q24H  . enoxaparin (LOVENOX) injection  40 mg Subcutaneous Q24H  . ipratropium-albuterol  3 mL Nebulization TID  . metoprolol succinate  50 mg Oral Daily  . predniSONE  40 mg Oral Q breakfast   Continuous Infusions: . sodium chloride Stopped (12/25/18 2135)  . cefTRIAXone (ROCEPHIN)  IV    . lactated ringers 75 mL/hr at 12/26/18 0500   PRN Meds:.sodium chloride, acetaminophen **OR** acetaminophen, albuterol,  benzonatate, guaiFENesin-dextromethorphan, polyethylene glycol, traMADol, traZODone      Subjective:   Tammy Ball was seen and examined today.  Sitting up in the chair, still appears short of breath, diffusely wheezing. Patient denies dizziness, chest pain, abdominal pain, N/V/D/C, new weakness, numbess, tingling.  Objective:   Vitals:   12/25/18 2101 12/26/18 0522 12/26/18 0924 12/26/18 0943  BP:  (!) 91/55 (!) 143/83   Pulse:  80 77   Resp:  19 20   Temp:  98 F (36.7 C) 97.7 F (36.5 C)   TempSrc:  Oral Oral   SpO2: 97% 96% 97% 97%  Weight:  81.2 kg    Height:        Intake/Output Summary (Last 24 hours) at 12/26/2018 1304 Last data filed at 12/26/2018 0500 Gross per 24 hour  Intake 1115.17 ml  Output -  Net 1115.17 ml     Wt Readings from Last 3 Encounters:  12/26/18 81.2 kg  12/20/18 80.1 kg  11/27/18 80.3 kg     Exam  General: Alert and oriented x 3, NAD  Eyes:  HEENT:  Atraumatic, normocephalic, normal oropharynx  Cardiovascular: S1 S2 auscultated, Regular rate and rhythm.  Respiratory: Bilateral expiratory wheezing  Gastrointestinal: Soft, nontender, nondistended, + bowel sounds  Ext: no pedal edema bilaterally  Neuro: No new deficits  Musculoskeletal: No digital cyanosis, clubbing  Skin: No rashes  Psych: Normal affect and demeanor, alert and oriented x3    Data Reviewed:  I have personally reviewed following labs and imaging studies  Micro Results Recent Results (from the past 240 hour(s))  Culture, blood (routine x 2)     Status: None (Preliminary result)   Collection Time: 12/25/18  3:02 PM  Result Value Ref Range Status   Specimen Description   Final    BLOOD LEFT ANTECUBITAL Performed at [facility] [address], [place], [place] [address]    Special Requests   Final    BOTTLES DRAWN AEROBIC AND ANAEROBIC Blood Culture adequate volume Performed at [facility] [address]  [place], [place] [address]    Culture   Final    NO GROWTH < 24 HOURS Performed at [facility] [address], [place], [place] [address]    Report Status PENDING  Incomplete  Culture, blood (routine x 2)     Status: None (Preliminary result)   Collection Time: 12/25/18  3:02 PM  Result Value Ref Range Status   Specimen Description   Final  BLOOD RIGHT WRIST Performed at [facility] [address], [place], [place] [address]    Special Requests   Final    BOTTLES DRAWN AEROBIC ONLY Blood Culture results may not be optimal due to an inadequate volume of blood received in culture bottles Performed at [facility] [address], [place], [place] [address]    Culture   Final    NO GROWTH < 24 HOURS Performed at [facility] [address], [place], [place] [address]    Report Status PENDING  Incomplete  Expectorated sputum assessment w rflx to resp cult     Status: None   Collection Time: 12/25/18  3:16 PM  Result Value Ref Range Status   Specimen Description SPUTUM  Final   Special Requests NONE  Final   Sputum evaluation   Final    THIS SPECIMEN IS ACCEPTABLE FOR SPUTUM CULTURE Performed at [facility], [place] [address], [place], [place] [address]    Report Status 12/25/2018 FINAL  Final  Culture, respiratory     Status: None (Preliminary result)   Collection Time: 12/25/18  3:16 PM  Result Value Ref Range Status   Specimen Description   Final    SPUTUM Performed at [facility] [address], [place], [place] [address]    Special Requests   Final    NONE Reflexed from [id number] Performed at [facility] [address], [place], [place] [address]    Gram Stain   Final    ABUNDANT WBC PRESENT, PREDOMINANTLY PMN ABUNDANT GRAM POSITIVE COCCI    Culture   Final    TOO YOUNG TO READ Performed at [facility], [place] [address] [place], [place] [address]     Report Status PENDING  Incomplete    Radiology Reports Dg Chest 2 View  Result Date: 12/25/2018 CLINICAL DATA:  Patient here from home with complaints of SOB since last Tuesday. Lung cancer patient. Reports that dr place her on portable O2 on Tuesday. Increase in cough. Productive. Hx COPD, HTN, renal cell carcinoma. EXAM: CHEST - 2 VIEW COMPARISON:  CT of the chest on 11/25/2018, chest x-ray on 02/19/2018 FINDINGS: Lungs are hyperinflated with. The heart size is normal. No pulmonary edema. No focal consolidations. Focal streaky opacity at the posterior aspect of the lung likely represents atelectasis or early infiltrates in both lung bases. There is mild perihilar peribronchial thickening. Remote UPPER thoracic vertebral augmentation. Remote cervical spine fusion. IMPRESSION: 1. Hyperinflation and bronchitic changes. 2. Bibasilar atelectasis or early infiltrates. Electronically Signed   By: Nolon Nations M.D.   On: 12/25/2018 13:43    Lab Data:  CBC: Recent Labs  Lab 12/25/18 1302 12/26/18 0604  WBC 13.8* 14.2*  NEUTROABS 11.7*  --   HGB 12.3 11.7*  HCT 39.2 37.3  MCV 100.3* 100.8*  PLT 300 025   Basic Metabolic Panel: Recent Labs  Lab 12/25/18 1302 12/26/18 0604  NA 132* 130*  K 4.6 4.4  CL 96* 92*  CO2 27 28  GLUCOSE 109* 125*  BUN 18 16  CREATININE 1.13* 0.96  CALCIUM 9.0 8.7*   GFR: Estimated Creatinine Clearance: 52.2 mL/min (by C-G formula based on SCr of 0.96 mg/dL). Liver Function Tests: Recent Labs  Lab 12/26/18 0604  AST 24  ALT 19  ALKPHOS 73  BILITOT 0.9  PROT 7.4  ALBUMIN 3.5   No results for input(s): LIPASE, AMYLASE in the last 168 hours.  No results for input(s): AMMONIA in the last 168 hours. Coagulation Profile: No results for input(s): INR, PROTIME in the last 168 hours. Cardiac Enzymes: No results for input(s): CKTOTAL, CKMB, CKMBINDEX, TROPONINI in the last 168 hours. BNP (last 3 results) No results for input(s): PROBNP in the last 8760  hours. HbA1C: No results for input(s): HGBA1C in the last 72 hours. CBG: No results for input(s): GLUCAP in the last 168 hours. Lipid Profile: No results for input(s): CHOL, HDL, LDLCALC, TRIG, CHOLHDL, LDLDIRECT in the last 72 hours. Thyroid Function Tests: No results for input(s): TSH, T4TOTAL, FREET4, T3FREE, THYROIDAB in the last 72 hours. Anemia Panel: No results for input(s): VITAMINB12, FOLATE, FERRITIN, TIBC, IRON, RETICCTPCT in the last 72 hours. Urine analysis:    Component Value Date/Time   COLORURINE YELLOW 12/25/2018 1320   APPEARANCEUR CLEAR 12/25/2018 1320   LABSPEC 1.016 12/25/2018 1320   LABSPEC 1.010 10/18/2015 0959   PHURINE 5.0 12/25/2018 1320   GLUCOSEU NEGATIVE 12/25/2018 1320   GLUCOSEU Negative 10/18/2015 0959   HGBUR NEGATIVE 12/25/2018 1320   BILIRUBINUR NEGATIVE 12/25/2018 1320   BILIRUBINUR negative 02/19/2018 0904   BILIRUBINUR Negative 10/18/2015 0959   KETONESUR NEGATIVE 12/25/2018 1320   PROTEINUR NEGATIVE 12/25/2018 1320   UROBILINOGEN 0.2 02/19/2018 0904   UROBILINOGEN 0.2 10/18/2015 0959   NITRITE NEGATIVE 12/25/2018 1320   LEUKOCYTESUR MODERATE (A) 12/25/2018 1320   LEUKOCYTESUR Negative 10/18/2015 0959     Ripudeep Rai M.D. [facility] 12/26/2018, 1:04 PM  Pager: [phone] Between 7am to 7pm - call Pager - [phone]  After 7pm go to [email or web address] - password [email or web address]  Call night coverage person covering after 7pm

## 2018-12-26 NOTE — Progress Notes (Signed)
   12/26/18 1406  Clinical Encounter Type  Visited With Patient  Visit Type Initial;Spiritual support  Referral From Nurse  Consult/Referral To Chaplain  The chaplain responded to the Pt. request for prayer.  The chaplain checked the chart and talked to the Pt. RN-Hannah before entering the Pt. room.  The Pt. was sitting up, eating lunch at the chaplain's time of arrival.  The Pt. introduced herself to the chaplain with her medical history. The chaplain heard conversation is a struggle with the Pt.'s pneumonia.  The chaplain affirmed the Pt. request for prayer and offered a F/U spiritual care visit if needed.

## 2018-12-27 LAB — CBC
HCT: 33.5 % — ABNORMAL LOW (ref 36.0–46.0)
Hemoglobin: 10.7 g/dL — ABNORMAL LOW (ref 12.0–15.0)
MCH: 31.6 pg (ref 26.0–34.0)
MCHC: 31.9 g/dL (ref 30.0–36.0)
MCV: 98.8 fL (ref 80.0–100.0)
Platelets: 313 10*3/uL (ref 150–400)
RBC: 3.39 MIL/uL — ABNORMAL LOW (ref 3.87–5.11)
RDW: 14.7 % (ref 11.5–15.5)
WBC: 8.1 10*3/uL (ref 4.0–10.5)
nRBC: 0 % (ref 0.0–0.2)

## 2018-12-27 LAB — STREP PNEUMONIAE URINARY ANTIGEN: Strep Pneumo Urinary Antigen: NEGATIVE

## 2018-12-27 LAB — BASIC METABOLIC PANEL
Anion gap: 8 (ref 5–15)
BUN: 22 mg/dL (ref 8–23)
CO2: 29 mmol/L (ref 22–32)
Calcium: 8.7 mg/dL — ABNORMAL LOW (ref 8.9–10.3)
Chloride: 95 mmol/L — ABNORMAL LOW (ref 98–111)
Creatinine, Ser: 0.82 mg/dL (ref 0.44–1.00)
GFR calc Af Amer: >60 mL/min (ref >60)
GFR calc non Af Amer: >60 mL/min (ref >60)
Glucose, Bld: 145 mg/dL — ABNORMAL HIGH (ref 70–99)
Potassium: 5.1 mmol/L (ref 3.5–5.1)
Sodium: 132 mmol/L — ABNORMAL LOW (ref 135–145)

## 2018-12-27 MED ORDER — LOPERAMIDE HCL 2 MG PO CAPS
4.0000 mg | ORAL_CAPSULE | Freq: Once | ORAL | Status: AC
  Administered 2018-12-27: 4 mg via ORAL
  Filled 2018-12-27: qty 2

## 2018-12-27 MED ORDER — WHITE PETROLATUM EX OINT
TOPICAL_OINTMENT | CUTANEOUS | Status: DC | PRN
  Filled 2018-12-27: qty 5

## 2018-12-27 NOTE — Progress Notes (Signed)
[facility]                                                                              Patient Demographics  Tammy Ball, is a 78 y.o. female, DOB - 1941-04-10, [place] date - 12/25/2018   Admitting Physician A Melven Sartorius., MD  Outpatient Primary MD for the patient is Forrest Moron, MD  Outpatient specialists:   LOS - 1  days   Medical records reviewed and are as summarized below:    Chief Complaint  Patient presents with  . Shortness of Breath       Brief summary   Patient is a 78 year old female with COPD, lung CA, followed by Dr. Julien Nordmann, renal cell CA, hypertension, hyperlipidemia presented with persistent cough and shortness of breath.  Per patient symptoms started last week, felt sick with sore throat, went to her doctor's office.  Patient had negative flu test and strep test.  She was told it was likely a viral illness, then developed fever with worsening dyspnea and wheezing.   Assessment & Plan    Principal Problem:   Acute respiratory failure with hypoxia (HCC) again due to community-acquired pneumonia and COPD exacerbation -Still wheezing diffusely -Continue IV Rocephin, Zithromax -Continue scheduled duo nebs, Pulmicort, flutter valve, added Brovana -Continue scheduled IV Solu-Medrol, no changes today -Will need home O2 evaluation  Active Problems:   Essential hypertension -BP stable     Hyperlipidemia -Continue Lipitor    Non-small cell carcinoma of right lung, stage 2 (HCC) -Stage IIIa, NSCLCa, diagnosed in November 2016, has received neoadjuvant systemic chemotherapy, chemoradiation, mini thoracotomy with right upper lobectomy, completed in 2017, currently under observation, follows Dr. Julien Nordmann  Code Status: Full CODE STATUS DVT Prophylaxis:  Lovenox  Family Communication: Discussed in detail with the patient, all imaging results, lab results explained to the patient   Disposition Plan: Will likely need  24 to 48 hours of IV steroids, IV antibiotics, inpatient treatment   Time Spent in minutes 35 minutes  Procedures:  None  Consultants:   None  Antimicrobials:   Anti-infectives (From admission, onward)   Start     Dose/Rate Route Frequency Ordered Stop   12/26/18 1600  azithromycin (ZITHROMAX) tablet 500 mg     500 mg Oral Every 24 hours 12/25/18 1951 01/01/19 1559   12/26/18 1500  cefTRIAXone (ROCEPHIN) 1 g in sodium chloride 0.9 % 100 mL IVPB     1 g 200 mL/hr over 30 Minutes Intravenous Every 24 hours 12/25/18 1951 01/01/19 1459   12/25/18 1600  azithromycin (ZITHROMAX) 500 mg in sodium chloride 0.9 % 250 mL IVPB     500 mg 250 mL/hr over 60 Minutes Intravenous  Once 12/25/18 1541 12/25/18 1717   12/25/18 1430  cefTRIAXone (ROCEPHIN) 1 g in sodium chloride 0.9 % 100 mL IVPB     1 g 200 mL/hr over 30 Minutes Intravenous  Once 12/25/18 1423 12/25/18 1540   12/25/18 1430  azithromycin (ZITHROMAX) 500 mg in sodium chloride 0.9 % 250 mL IVPB  Status:  Discontinued     500 mg 250 mL/hr over 60 Minutes Intravenous  Once 12/25/18 1423 12/25/18 1810         Medications  Scheduled Meds: . aspirin EC  81 mg Oral Daily  . atorvastatin  40 mg Oral q1800  . azithromycin  500 mg Oral Q24H  . budesonide (PULMICORT) nebulizer solution  0.25 mg Nebulization BID  . enoxaparin (LOVENOX) injection  40 mg Subcutaneous Q24H  . ipratropium-albuterol  3 mL Nebulization TID  . methylPREDNISolone (SOLU-MEDROL) injection  60 mg Intravenous Q8H  . metoprolol succinate  25 mg Oral Daily  . pantoprazole  40 mg Oral Q0600   Continuous Infusions: . sodium chloride Stopped (12/25/18 2135)  . cefTRIAXone (ROCEPHIN)  IV 1 g (12/26/18 1605)   PRN Meds:.sodium chloride, acetaminophen **OR** acetaminophen, albuterol, benzonatate, guaiFENesin-dextromethorphan, polyethylene glycol, traMADol, traZODone      Subjective:   Sui Kasparek was seen and examined today.  Sitting up in the chair, feels  wheezy, no fevers.  Patient denies dizziness, chest pain, abdominal pain, N/V/D/C, new weakness, numbess, tingling.  Objective:   Vitals:   12/26/18 2043 12/27/18 0523 12/27/18 0922 12/27/18 0923  BP: (!) 147/83 124/61  (!) 163/96  Pulse: 88 75 88 81  Resp: 18 18 17    Temp: 98 F (36.7 C) 98.4 F (36.9 C)  98.6 F (37 C)  TempSrc: Oral Oral  Oral  SpO2: 95% 95% 95% 95%  Weight:  81.9 kg    Height:        Intake/Output Summary (Last 24 hours) at 12/27/2018 1212 Last data filed at 12/26/2018 1500 Gross per 24 hour  Intake 240 ml  Output -  Net 240 ml     Wt Readings from Last 3 Encounters:  12/27/18 81.9 kg  12/20/18 80.1 kg  11/27/18 80.3 kg    Physical Exam  General: Alert and oriented x 3, NAD  Eyes:   HEENT:    Cardiovascular: S1 S2 clear, no murmurs, RRR.  Mild ankle edema  Respiratory: Bilateral expiratory wheezing  Gastrointestinal: Soft, nontender, nondistended, NBS  Ext: Mild ankle edema bilaterally  Neuro: no new deficits  Musculoskeletal: No cyanosis, clubbing  Skin: No rashes  Psych: Normal affect and demeanor, alert and oriented x3      Data Reviewed:  I have personally reviewed following labs and imaging studies  Micro Results Recent Results (from the past 240 hour(s))  Culture, blood (routine x 2)     Status: None (Preliminary result)   Collection Time: 12/25/18  3:02 PM  Result Value Ref Range Status   Specimen Description   Final    BLOOD LEFT ANTECUBITAL Performed at [facility] [address], [place], [place] [address]    Special Requests   Final    BOTTLES DRAWN AEROBIC AND ANAEROBIC Blood Culture adequate volume Performed at [facility] [address] [place], [place] [address]    Culture   Final    NO GROWTH 2 DAYS Performed at [facility] [address] [place], [place] [address]    Report Status PENDING  Incomplete  Culture, blood (routine x 2)     Status: None  (Preliminary result)   Collection Time: 12/25/18  3:02 PM  Result Value Ref Range Status   Specimen Description   Final    BLOOD RIGHT WRIST Performed at [facility] [address] [place], [place] [address]    Special Requests   Final    BOTTLES DRAWN AEROBIC ONLY Blood Culture results may not be optimal due  to an inadequate volume of blood received in culture bottles Performed at [facility] [address] [place], [place] [address]    Culture   Final    NO GROWTH 2 DAYS Performed at [facility] [address], [place], [place] [address]    Report Status PENDING  Incomplete  Expectorated sputum assessment w rflx to resp cult     Status: None   Collection Time: 12/25/18  3:16 PM  Result Value Ref Range Status   Specimen Description SPUTUM  Final   Special Requests NONE  Final   Sputum evaluation   Final    THIS SPECIMEN IS ACCEPTABLE FOR SPUTUM CULTURE Performed at [facility], [place] [address], [place], [place] [address]    Report Status 12/25/2018 FINAL  Final  Culture, respiratory     Status: None (Preliminary result)   Collection Time: 12/25/18  3:16 PM  Result Value Ref Range Status   Specimen Description   Final    SPUTUM Performed at [facility] [address], [place], [place] [address]    Special Requests   Final    NONE Reflexed from [id number] Performed at [facility] [address], [place], [place] [address]    Gram Stain   Final    ABUNDANT WBC PRESENT, PREDOMINANTLY PMN ABUNDANT GRAM POSITIVE COCCI    Culture   Final    CULTURE REINCUBATED FOR BETTER GROWTH Performed at [facility], [place] [address] [place], [place] [address]    Report Status PENDING  Incomplete    Radiology Reports Dg Chest 2 View  Result Date: 12/25/2018 CLINICAL DATA:  Patient here from home with complaints of SOB since last Tuesday. Lung cancer patient. Reports that  dr place her on portable O2 on Tuesday. Increase in cough. Productive. Hx COPD, HTN, renal cell carcinoma. EXAM: CHEST - 2 VIEW COMPARISON:  CT of the chest on 11/25/2018, chest x-ray on 02/19/2018 FINDINGS: Lungs are hyperinflated with. The heart size is normal. No pulmonary edema. No focal consolidations. Focal streaky opacity at the posterior aspect of the lung likely represents atelectasis or early infiltrates in both lung bases. There is mild perihilar peribronchial thickening. Remote UPPER thoracic vertebral augmentation. Remote cervical spine fusion. IMPRESSION: 1. Hyperinflation and bronchitic changes. 2. Bibasilar atelectasis or early infiltrates. Electronically Signed   By: Nolon Nations M.D.   On: 12/25/2018 13:43    Lab Data:  CBC: Recent Labs  Lab 12/25/18 1302 12/26/18 0604 12/27/18 0559  WBC 13.8* 14.2* 8.1  NEUTROABS 11.7*  --   --   HGB 12.3 11.7* 10.7*  HCT 39.2 37.3 33.5*  MCV 100.3* 100.8* 98.8  PLT 300 310 211   Basic Metabolic Panel: Recent Labs  Lab 12/25/18 1302 12/26/18 0604 12/27/18 0559  NA 132* 130* 132*  K 4.6 4.4 5.1  CL 96* 92* 95*  CO2 27 28 29   GLUCOSE 109* 125* 145*  BUN 18 16 22   CREATININE 1.13* 0.96 0.82  CALCIUM 9.0 8.7* 8.7*   GFR: Estimated Creatinine Clearance: 61.4 mL/min (by C-G formula based on SCr of 0.82 mg/dL). Liver Function Tests: Recent Labs  Lab 12/26/18 0604  AST 24  ALT 19  ALKPHOS 73  BILITOT 0.9  PROT 7.4  ALBUMIN 3.5   No results for input(s): LIPASE, AMYLASE in the last 168 hours. No results for input(s): AMMONIA in the last 168 hours. Coagulation Profile: No results for input(s): INR, PROTIME in  the last 168 hours. Cardiac Enzymes: No results for input(s): CKTOTAL, CKMB, CKMBINDEX, TROPONINI in the last 168 hours. BNP (last 3 results) No results for input(s): PROBNP in the last 8760 hours. HbA1C: No results for input(s): HGBA1C in the last 72 hours. CBG: No results for input(s): GLUCAP in the last  168 hours. Lipid Profile: No results for input(s): CHOL, HDL, LDLCALC, TRIG, CHOLHDL, LDLDIRECT in the last 72 hours. Thyroid Function Tests: No results for input(s): TSH, T4TOTAL, FREET4, T3FREE, THYROIDAB in the last 72 hours. Anemia Panel: No results for input(s): VITAMINB12, FOLATE, FERRITIN, TIBC, IRON, RETICCTPCT in the last 72 hours. Urine analysis:    Component Value Date/Time   COLORURINE YELLOW 12/25/2018 1320   APPEARANCEUR CLEAR 12/25/2018 1320   LABSPEC 1.016 12/25/2018 1320   LABSPEC 1.010 10/18/2015 0959   PHURINE 5.0 12/25/2018 1320   GLUCOSEU NEGATIVE 12/25/2018 1320   GLUCOSEU Negative 10/18/2015 0959   HGBUR NEGATIVE 12/25/2018 1320   BILIRUBINUR NEGATIVE 12/25/2018 1320   BILIRUBINUR negative 02/19/2018 0904   BILIRUBINUR Negative 10/18/2015 0959   KETONESUR NEGATIVE 12/25/2018 1320   PROTEINUR NEGATIVE 12/25/2018 1320   UROBILINOGEN 0.2 02/19/2018 0904   UROBILINOGEN 0.2 10/18/2015 0959   NITRITE NEGATIVE 12/25/2018 1320   LEUKOCYTESUR MODERATE (A) 12/25/2018 1320   LEUKOCYTESUR Negative 10/18/2015 0959     Ripudeep Rai M.D. [facility] 12/27/2018, 12:12 PM  Pager: [phone] Between 7am to 7pm - call Pager - [phone]  After 7pm go to [email or web address] - password [email or web address]  Call night coverage person covering after 7pm

## 2018-12-28 LAB — CULTURE, RESPIRATORY W GRAM STAIN

## 2018-12-28 LAB — BASIC METABOLIC PANEL
Anion gap: 10 (ref 5–15)
BUN: 25 mg/dL — ABNORMAL HIGH (ref 8–23)
CO2: 32 mmol/L (ref 22–32)
Calcium: 9 mg/dL (ref 8.9–10.3)
Chloride: 94 mmol/L — ABNORMAL LOW (ref 98–111)
Creatinine, Ser: 1.02 mg/dL — ABNORMAL HIGH (ref 0.44–1.00)
GFR calc Af Amer: >60 mL/min (ref >60)
GFR calc non Af Amer: 54 mL/min — ABNORMAL LOW (ref >60)
Glucose, Bld: 164 mg/dL — ABNORMAL HIGH (ref 70–99)
Potassium: 4.7 mmol/L (ref 3.5–5.1)
Sodium: 136 mmol/L (ref 135–145)

## 2018-12-28 LAB — CBC
HCT: 36.6 % (ref 36.0–46.0)
Hemoglobin: 11.4 g/dL — ABNORMAL LOW (ref 12.0–15.0)
MCH: 30.3 pg (ref 26.0–34.0)
MCHC: 31.1 g/dL (ref 30.0–36.0)
MCV: 97.3 fL (ref 80.0–100.0)
Platelets: 387 10*3/uL (ref 150–400)
RBC: 3.76 MIL/uL — ABNORMAL LOW (ref 3.87–5.11)
RDW: 15.1 % (ref 11.5–15.5)
WBC: 9.3 10*3/uL (ref 4.0–10.5)
nRBC: 0 % (ref 0.0–0.2)

## 2018-12-28 LAB — EXPECTORATED SPUTUM ASSESSMENT W GRAM STAIN, RFLX TO RESP C

## 2018-12-28 LAB — CULTURE, RESPIRATORY

## 2018-12-28 MED ORDER — HYDRALAZINE HCL 20 MG/ML IJ SOLN
10.0000 mg | Freq: Four times a day (QID) | INTRAMUSCULAR | Status: DC | PRN
  Administered 2018-12-28: 10 mg via INTRAVENOUS
  Filled 2018-12-28: qty 1

## 2018-12-28 MED ORDER — LORAZEPAM 2 MG/ML IJ SOLN
0.5000 mg | Freq: Once | INTRAMUSCULAR | Status: AC
  Administered 2018-12-28: 0.5 mg via INTRAVENOUS
  Filled 2018-12-28: qty 1

## 2018-12-28 MED ORDER — IPRATROPIUM BROMIDE 0.02 % IN SOLN
0.5000 mg | Freq: Four times a day (QID) | RESPIRATORY_TRACT | Status: DC
  Administered 2018-12-28 – 2018-12-30 (×7): 0.5 mg via RESPIRATORY_TRACT
  Filled 2018-12-28 (×7): qty 2.5

## 2018-12-28 MED ORDER — LEVALBUTEROL HCL 0.63 MG/3ML IN NEBU: 0.6300 mg | INHALATION_SOLUTION | RESPIRATORY_TRACT | Status: DC | PRN

## 2018-12-28 MED ORDER — LEVALBUTEROL HCL 0.63 MG/3ML IN NEBU
0.6300 mg | INHALATION_SOLUTION | Freq: Four times a day (QID) | RESPIRATORY_TRACT | Status: DC
  Administered 2018-12-28 – 2018-12-30 (×7): 0.63 mg via RESPIRATORY_TRACT
  Filled 2018-12-28 (×7): qty 3

## 2018-12-28 NOTE — Progress Notes (Signed)
[facility]                                                                              Patient Demographics  Tammy Ball, is a 78 y.o. female, DOB - 1941-06-19, [place] date - 12/25/2018   Admitting Physician A Melven Sartorius., MD  Outpatient Primary MD for the patient is Forrest Moron, MD  Outpatient specialists:   LOS - 2  days   Medical records reviewed and are as summarized below:    Chief Complaint  Patient presents with  . Shortness of Breath       Brief summary   Patient is a 78 year old female with COPD, lung CA, followed by Dr. Julien Nordmann, renal cell CA, hypertension, hyperlipidemia presented with persistent cough and shortness of breath.  Per patient symptoms started last week, felt sick with sore throat, went to her doctor's office.  Patient had negative flu test and strep test.  She was told it was likely a viral illness, then developed fever with worsening dyspnea and wheezing.   Assessment & Plan    Principal Problem:   Acute respiratory failure with hypoxia (HCC) again due to community-acquired pneumonia and COPD exacerbation -Still bilateral expiratory wheezing -Continue IV Rocephin, Zithromax, scheduled duo nebs, Pulmicort, flutter valve, Brovana -Continue scheduled IV Solu-Medrol, no changes today -Will need home O2 evaluation, outpatient pulmonology follow-up appointment  Active Problems:   Essential hypertension -BP stable    Hyperlipidemia -Continue Lipitor    Non-small cell carcinoma of right lung, stage 2 ([place]) -Stage IIIa, NSCLCa, diagnosed in November 2016, has received neoadjuvant systemic chemotherapy, chemoradiation, mini thoracotomy with right upper lobectomy, completed in 2017, currently under observation, follows Dr. Julien Nordmann  Diarrhea Improving  Code Status: Full CODE STATUS DVT Prophylaxis:  Lovenox  Family Communication: Discussed in detail with the patient, all imaging results, lab results  explained to the patient   Disposition Plan: Not medically ready for discharge, will likely need at least 24 to 48 hours of IV steroids, scheduled duo nebs.  DC telemetry.   Time Spent in minutes 25 minutes  Procedures:  None  Consultants:   None  Antimicrobials:   Anti-infectives (From admission, onward)   Start     Dose/Rate Route Frequency Ordered Stop   12/26/18 1600  azithromycin (ZITHROMAX) tablet 500 mg     500 mg Oral Every 24 hours 12/25/18 1951 01/01/19 1559   12/26/18 1500  cefTRIAXone (ROCEPHIN) 1 g in sodium chloride 0.9 % 100 mL IVPB     1 g 200 mL/hr over 30 Minutes Intravenous Every 24 hours 12/25/18 1951 01/01/19 1459   12/25/18 1600  azithromycin (ZITHROMAX) 500 mg in sodium chloride 0.9 % 250 mL IVPB     500 mg 250 mL/hr over 60 Minutes Intravenous  Once 12/25/18 1541 12/25/18 1717   12/25/18 1430  cefTRIAXone (ROCEPHIN) 1 g in sodium chloride 0.9 % 100 mL IVPB     1 g 200 mL/hr over 30 Minutes Intravenous  Once 12/25/18 1423 12/25/18 1540   12/25/18 1430  azithromycin (ZITHROMAX) 500 mg in sodium chloride 0.9 % 250 mL IVPB  Status:  Discontinued     500 mg 250 mL/hr over 60 Minutes Intravenous  Once 12/25/18 1423 12/25/18 1810         Medications  Scheduled Meds: . aspirin EC  81 mg Oral Daily  . atorvastatin  40 mg Oral q1800  . azithromycin  500 mg Oral Q24H  . budesonide (PULMICORT) nebulizer solution  0.25 mg Nebulization BID  . enoxaparin (LOVENOX) injection  40 mg Subcutaneous Q24H  . ipratropium-albuterol  3 mL Nebulization TID  . methylPREDNISolone (SOLU-MEDROL) injection  60 mg Intravenous Q8H  . metoprolol succinate  25 mg Oral Daily  . pantoprazole  40 mg Oral Q0600   Continuous Infusions: . sodium chloride Stopped (12/25/18 2135)  . cefTRIAXone (ROCEPHIN)  IV 1 g (12/27/18 1451)   PRN Meds:.sodium chloride, acetaminophen **OR** acetaminophen, albuterol, benzonatate, guaiFENesin-dextromethorphan, polyethylene glycol, traMADol,  traZODone, white petrolatum      Subjective:   Tammy Ball was seen and examined today.  Feels miserable with wheezing, not enough sleep, tired.  No fevers.  Patient denies dizziness, chest pain, abdominal pain, N/V/D/C, new weakness, numbess, tingling.  Objective:   Vitals:   12/28/18 0803 12/28/18 0810 12/28/18 1011 12/28/18 1016  BP:   (!) 167/112 (!) 156/80  Pulse:   (!) 109 (!) 102  Resp:   20   Temp:   98.2 F (36.8 C)   TempSrc:   Oral   SpO2: 93% 93% 94% 95%  Weight:      Height:        Intake/Output Summary (Last 24 hours) at 12/28/2018 1159 Last data filed at 12/28/2018 0900 Gross per 24 hour  Intake 320 ml  Output 650 ml  Net -330 ml     Wt Readings from Last 3 Encounters:  12/28/18 81.3 kg  12/20/18 80.1 kg  11/27/18 80.3 kg   Physical Exam  General: Alert and oriented x 3, NAD  Eyes:   HEENT:    Cardiovascular: S1 S2 clear,  RRR. No pedal edema b/l  Respiratory: Bilateral expiratory wheezing  Gastrointestinal: Soft, nontender, nondistended, NBS  Ext: no pedal edema bilaterally, TED hoses  Neuro: no new deficits  Musculoskeletal: No cyanosis, clubbing  Skin: No rashes  Psych: Normal affect and demeanor, alert and oriented x3      Data Reviewed:  I have personally reviewed following labs and imaging studies  Micro Results Recent Results (from the past 240 hour(s))  Culture, blood (routine x 2)     Status: None (Preliminary result)   Collection Time: 12/25/18  3:02 PM  Result Value Ref Range Status   Specimen Description   Final    BLOOD LEFT ANTECUBITAL Performed at [facility] [address], [place], [place] [address]    Special Requests   Final    BOTTLES DRAWN AEROBIC AND ANAEROBIC Blood Culture adequate volume Performed at [facility] [address], [place], [place] [address]    Culture   Final    NO GROWTH 3 DAYS Performed at [facility], [place] [address],  [place], [place] [address]    Report Status PENDING  Incomplete  Culture, blood (routine x 2)     Status: None (Preliminary result)   Collection Time: 12/25/18  3:02 PM  Result Value Ref Range Status   Specimen Description   Final    BLOOD RIGHT WRIST Performed at [facility] [address] [place], [place] [address]    Special Requests   Final  BOTTLES DRAWN AEROBIC ONLY Blood Culture results may not be optimal due to an inadequate volume of blood received in culture bottles Performed at [facility], [place] [address], [place], [place] [address]    Culture   Final    NO GROWTH 3 DAYS Performed at [facility], [place] [address], [place], [place] [address]    Report Status PENDING  Incomplete  Expectorated sputum assessment w rflx to resp cult     Status: None   Collection Time: 12/25/18  3:16 PM  Result Value Ref Range Status   Specimen Description SPUTUM  Final   Special Requests NONE  Final   Sputum evaluation   Final    THIS SPECIMEN IS ACCEPTABLE FOR SPUTUM CULTURE Performed at [facility], [place] [address], [place], [place] [address]    Report Status 12/25/2018 FINAL  Final  Culture, respiratory     Status: None   Collection Time: 12/25/18  3:16 PM  Result Value Ref Range Status   Specimen Description   Final    SPUTUM Performed at [facility] [address], [place], [place] [address]    Special Requests   Final    NONE Reflexed from [id number] Performed at [facility] [address] [place], [place] [address]    Gram Stain   Final    ABUNDANT WBC PRESENT, PREDOMINANTLY PMN ABUNDANT GRAM POSITIVE COCCI Performed at [facility], [place] [address] [place],  [address]    Culture MODERATE STREPTOCOCCUS PNEUMONIAE  Final   Report Status 12/28/2018 FINAL  Final   Organism ID, Bacteria STREPTOCOCCUS PNEUMONIAE  Final      Susceptibility   Streptococcus pneumoniae  - MIC*    ERYTHROMYCIN >=8 RESISTANT Resistant     LEVOFLOXACIN 0.5 SENSITIVE Sensitive     VANCOMYCIN 0.5 SENSITIVE Sensitive     PENICILLIN (non-meningitis) 1 SENSITIVE Sensitive     CEFTRIAXONE (non-meningitis) 1 SENSITIVE Sensitive     * MODERATE STREPTOCOCCUS PNEUMONIAE    Radiology Reports Dg Chest 2 View  Result Date: 12/25/2018 CLINICAL DATA:  Patient here from home with complaints of SOB since last Tuesday. Lung cancer patient. Reports that dr place her on portable O2 on Tuesday. Increase in cough. Productive. Hx COPD, HTN, renal cell carcinoma. EXAM: CHEST - 2 VIEW COMPARISON:  CT of the chest on 11/25/2018, chest x-ray on 02/19/2018 FINDINGS: Lungs are hyperinflated with. The heart size is normal. No pulmonary edema. No focal consolidations. Focal streaky opacity at the posterior aspect of the lung likely represents atelectasis or early infiltrates in both lung bases. There is mild perihilar peribronchial thickening. Remote UPPER thoracic vertebral augmentation. Remote cervical spine fusion. IMPRESSION: 1. Hyperinflation and bronchitic changes. 2. Bibasilar atelectasis or early infiltrates. Electronically Signed   By: Nolon Nations M.D.   On: 12/25/2018 13:43    Lab Data:  CBC: Recent Labs  Lab 12/25/18 1302 12/26/18 0604 12/27/18 0559 12/28/18 0541  WBC 13.8* 14.2* 8.1 9.3  NEUTROABS 11.7*  --   --   --   HGB 12.3 11.7* 10.7* 11.4*  HCT 39.2 37.3 33.5* 36.6  MCV 100.3* 100.8* 98.8 97.3  PLT 300 310 313 465   Basic Metabolic Panel: Recent Labs  Lab 12/25/18 1302 12/26/18 0604 12/27/18 0559 12/28/18 0541  NA 132* 130* 132* 136  K 4.6 4.4 5.1 4.7  CL 96* 92* 95* 94*  CO2 27 28 29  32  GLUCOSE 109* 125* 145* 164*  BUN 18 16 22  25*  CREATININE 1.13* 0.96 0.82 1.02*  CALCIUM 9.0 8.7* 8.7* 9.0   GFR: Estimated Creatinine Clearance: 49.1 mL/min (A) (by C-G formula based on SCr of 1.02 mg/dL (H)). Liver Function Tests: Recent Labs  Lab 12/26/18 0604  AST 24    ALT 19  ALKPHOS 73  BILITOT 0.9  PROT 7.4  ALBUMIN 3.5   No results for input(s): LIPASE, AMYLASE in the last 168 hours. No results for input(s): AMMONIA in the last 168 hours. Coagulation Profile: No results for input(s): INR, PROTIME in the last 168 hours. Cardiac Enzymes: No results for input(s): CKTOTAL, CKMB, CKMBINDEX, TROPONINI in the last 168 hours. BNP (last 3 results) No results for input(s): PROBNP in the last 8760 hours. HbA1C: No results for input(s): HGBA1C in the last 72 hours. CBG: No results for input(s): GLUCAP in the last 168 hours. Lipid Profile: No results for input(s): CHOL, HDL, LDLCALC, TRIG, CHOLHDL, LDLDIRECT in the last 72 hours. Thyroid Function Tests: No results for input(s): TSH, T4TOTAL, FREET4, T3FREE, THYROIDAB in the last 72 hours. Anemia Panel: No results for input(s): VITAMINB12, FOLATE, FERRITIN, TIBC, IRON, RETICCTPCT in the last 72 hours. Urine analysis:    Component Value Date/Time   COLORURINE YELLOW 12/25/2018 1320   APPEARANCEUR CLEAR 12/25/2018 1320   LABSPEC 1.016 12/25/2018 1320   LABSPEC 1.010 10/18/2015 0959   PHURINE 5.0 12/25/2018 1320   GLUCOSEU NEGATIVE 12/25/2018 1320   GLUCOSEU Negative 10/18/2015 0959   HGBUR NEGATIVE 12/25/2018 1320   BILIRUBINUR NEGATIVE 12/25/2018 1320   BILIRUBINUR negative 02/19/2018 0904   BILIRUBINUR Negative 10/18/2015 0959   KETONESUR NEGATIVE 12/25/2018 1320   PROTEINUR NEGATIVE 12/25/2018 1320   UROBILINOGEN 0.2 02/19/2018 0904   UROBILINOGEN 0.2 10/18/2015 0959   NITRITE NEGATIVE 12/25/2018 1320   LEUKOCYTESUR MODERATE (A) 12/25/2018 1320   LEUKOCYTESUR Negative 10/18/2015 0959     Alexandros Ewan M.D. [facility] 12/28/2018, 11:59 AM  Pager: [phone] Between 7am to 7pm - call Pager - [phone]  After 7pm go to [email or web address] - password [email or web address]  Call night coverage person covering after 7pm

## 2018-12-28 NOTE — Progress Notes (Addendum)
Patient called writer to room  complaining of shortness breath   oxygen level on continuous pulse ox was 85% HR 129.  Assisted patient back to bed, VS sign taken BP 176/102 HR 114 oxygen level  91% on 3 liters . Patient denies chest pain. Will notify MD.

## 2018-12-29 ENCOUNTER — Inpatient Hospital Stay (HOSPITAL_COMMUNITY): Payer: Medicare Other

## 2018-12-29 DIAGNOSIS — J9601 Acute respiratory failure with hypoxia: Secondary | ICD-10-CM

## 2018-12-29 LAB — BASIC METABOLIC PANEL
Anion gap: 10 (ref 5–15)
BUN: 31 mg/dL — ABNORMAL HIGH (ref 8–23)
CO2: 32 mmol/L (ref 22–32)
Calcium: 9.5 mg/dL (ref 8.9–10.3)
Chloride: 96 mmol/L — ABNORMAL LOW (ref 98–111)
Creatinine, Ser: 1.05 mg/dL — ABNORMAL HIGH (ref 0.44–1.00)
GFR calc Af Amer: >60 mL/min (ref >60)
GFR calc non Af Amer: 52 mL/min — ABNORMAL LOW (ref >60)
Glucose, Bld: 134 mg/dL — ABNORMAL HIGH (ref 70–99)
Potassium: 4.7 mmol/L (ref 3.5–5.1)
Sodium: 138 mmol/L (ref 135–145)

## 2018-12-29 LAB — CBC
HCT: 40 % (ref 36.0–46.0)
Hemoglobin: 12.7 g/dL (ref 12.0–15.0)
MCH: 30.8 pg (ref 26.0–34.0)
MCHC: 31.8 g/dL (ref 30.0–36.0)
MCV: 97.1 fL (ref 80.0–100.0)
Platelets: 458 10*3/uL — ABNORMAL HIGH (ref 150–400)
RBC: 4.12 MIL/uL (ref 3.87–5.11)
RDW: 15.6 % — ABNORMAL HIGH (ref 11.5–15.5)
WBC: 10.8 10*3/uL — ABNORMAL HIGH (ref 4.0–10.5)
nRBC: 0 % (ref 0.0–0.2)

## 2018-12-29 MED ORDER — PHENOL 1.4 % MT LIQD
1.0000 | OROMUCOSAL | Status: DC | PRN
  Administered 2018-12-29 (×2): 1 via OROMUCOSAL
  Filled 2018-12-29: qty 177

## 2018-12-29 MED ORDER — SALINE SPRAY 0.65 % NA SOLN
1.0000 | NASAL | Status: DC | PRN
  Administered 2018-12-29: 1 via NASAL
  Filled 2018-12-29: qty 44

## 2018-12-29 MED ORDER — BUDESONIDE 0.5 MG/2ML IN SUSP
0.5000 mg | Freq: Two times a day (BID) | RESPIRATORY_TRACT | Status: DC
  Administered 2018-12-29 – 2018-12-30 (×2): 0.5 mg via RESPIRATORY_TRACT
  Filled 2018-12-29 (×2): qty 2

## 2018-12-29 NOTE — Progress Notes (Signed)
PT demonstrated hands on understanding of Flutter device. NPC at this time.

## 2018-12-29 NOTE — Care Management Important Message (Signed)
Important Message  Patient Details  Name: Tammy Ball MRN: [id number] Date of Birth: 1941/07/06   Medicare Important Message Given:  Yes    Kerin Salen 12/29/2018, 11:58 AMImportant Message  Patient Details  Name: Tammy Ball MRN: [id number] Date of Birth: January 16, 1941   Medicare Important Message Given:  Yes    Kerin Salen 12/29/2018, 11:58 AM

## 2018-12-29 NOTE — Consult Note (Signed)
NAME:  Tammy Ball, MRN:  [id number], DOB:  [birth year], LOS: 3 ADMISSION DATE:  12/25/2018, CONSULTATION DATE:  2/17 REFERRING MD:  Tana Coast - TR[facility], CHIEF COMPLAINT:  AECOPD   Brief History   78 year old female with history of COPD, lung cancer admitted 213 with COPD exacerbation +/- CAP.  Pulmonary consulted 2/17 for ongoing dyspnea and bronchospasm.  History of present illness   78 year old female former smoker (40 pack years, quit 2012) with history of COPD, hypertension, stage III R[place] lung cancer renal cell carcinoma who presented 2/13 with 1 week history of cough, shortness of breath.  She was seen as an outpatient with negative flu and strep swabs.  She was initially improving somewhat but then developed fever and worsening shortness of breath.  She was admitted by [facility] with AECOPD and treated with IV steroids, bronchodilators, empiric antibiotics.  On 2/17 she remained bronchospastic without significant improvement and pulmonary consulted for assistance.  At baseline she takes advair daily and rarely needs rescue SABA.  Still works as Social worker for [employer]  At baseline can do most things without dyspnea - grocery shops, cleans house, cooks, etc.   Past Medical History   has a past medical history of Anemia, Anxiety, Asthma, Chemotherapy-induced neuropathy ([place]) (11/01/2015), Claustrophobia, COPD (chronic obstructive pulmonary disease) ([place]), Depression, Encounter for antineoplastic chemotherapy (02/09/2016), Glaucoma, Headache, Heart murmur, History of echocardiogram, History of nuclear stress test, History of radiation therapy (10/30/16-11/06/16), Hyperlipidemia, Hypertension, Insomnia (05/16/2016), Non-small cell carcinoma of right lung, stage 2 ([place]) (10/03/2015), Radiation (02/29/16-04/10/16), and Renal cell carcinoma ([place]).   Significant Hospital Events     Consults:  Pulmonary 2/17>>>  Procedures:    Significant Diagnostic Tests:    Micro Data:  Sputum 2/13 >>strep  pneumo Urine strep 2/13 >>negative BC X2 2/13 >>  Antimicrobials:  Ceftriaxone 2/14 >> Azithro 2/14>>2/16  Interim history/subjective:  Feeling a bit better today but remains SOB.   Objective   Blood pressure (!) 149/87, pulse 95, temperature 98.4 F (36.9 C), temperature source Oral, resp. rate (!) 22, height 5\' 4"  (1.626 m), weight 79.9 kg, SpO2 97 %.        Intake/Output Summary (Last 24 hours) at 12/29/2018 1053 Last data filed at 12/29/2018 0500 Gross per 24 hour  Intake 700 ml  Output -  Net 700 ml   Filed Weights   12/27/18 0523 12/28/18 0528 12/29/18 0510  Weight: 81.9 kg 81.3 kg 79.9 kg    Examination: General: pleasant female, NAD in chair  HENT: mm moist, no JVD  Lungs: resps even non labored on [place], coarse L>R, diffuse exp wheeze, significant upper airway wheeze as well  Cardiovascular: s1s2 rrr Abdomen: soft, non tender  Extremities: warm and dry, no edema  Neuro: awake, alert, appropriate, MAE  Resolved Hospital Problem list     Assessment & Plan:  Acute hypoxic respiratory failure-multifactorial related to acute exacerbation of COPD and CAP +/- post viral bronchitis.   PFT's 2017==> FEV1 1.83 (83%pred), FEV1/FVC 71, reduced DLCO, no sig BD change Plan- Continue IV steroids Continue antibiotics Continue bronchodilators - duonebs q6, budesonide BID  Supplemental O2 as needed to keep O2 sats greater than 90% follow-up chest x-ray now  Mobilize as tolerates Follow-up sputum culture pending Ambulatory desat study prior to d/c  Continue pulmonary hygiene  outpt pulmonary f/u   Labs   CBC: Recent Labs  Lab 12/25/18 1302 12/26/18 0604 12/27/18 0559 12/28/18 0541 12/29/18 0530  WBC 13.8* 14.2* 8.1 9.3 10.8*  NEUTROABS 11.7*  --   --   --   --  HGB 12.3 11.7* 10.7* 11.4* 12.7  HCT 39.2 37.3 33.5* 36.6 40.0  MCV 100.3* 100.8* 98.8 97.3 97.1  PLT 300 310 313 387 458*    Basic Metabolic Panel: Recent Labs  Lab 12/25/18 1302 12/26/18 0604  12/27/18 0559 12/28/18 0541 12/29/18 0530  NA 132* 130* 132* 136 138  K 4.6 4.4 5.1 4.7 4.7  CL 96* 92* 95* 94* 96*  CO2 27 28 29  32 32  GLUCOSE 109* 125* 145* 164* 134*  BUN 18 16 22  25* 31*  CREATININE 1.13* 0.96 0.82 1.02* 1.05*  CALCIUM 9.0 8.7* 8.7* 9.0 9.5   GFR: Estimated Creatinine Clearance: 47.4 mL/min (A) (by C-G formula based on SCr of 1.05 mg/dL (H)). Recent Labs  Lab 12/26/18 0604 12/27/18 0559 12/28/18 0541 12/29/18 0530  WBC 14.2* 8.1 9.3 10.8*    Liver Function Tests: Recent Labs  Lab 12/26/18 0604  AST 24  ALT 19  ALKPHOS 73  BILITOT 0.9  PROT 7.4  ALBUMIN 3.5   No results for input(s): LIPASE, AMYLASE in the last 168 hours. No results for input(s): AMMONIA in the last 168 hours.  ABG    Component Value Date/Time   PHART 7.333 (L) 01/19/2016 0424   PCO2ART 55.5 (H) 01/19/2016 0424   PO2ART 81.0 01/19/2016 0424   HCO3 29.5 (H) 01/19/2016 0424   TCO2 31 01/19/2016 0424   ACIDBASEDEF 1.0 01/18/2016 1120   O2SAT 95.0 01/19/2016 0424     Coagulation Profile: No results for input(s): INR, PROTIME in the last 168 hours.  Cardiac Enzymes: No results for input(s): CKTOTAL, CKMB, CKMBINDEX, TROPONINI in the last 168 hours.  HbA1C: No results found for: HGBA1C  CBG: No results for input(s): GLUCAP in the last 168 hours.  Review of Systems:   As per HPI - All other systems reviewed and were neg.    Past Medical History  She,  has a past medical history of Anemia, Anxiety, Asthma, Chemotherapy-induced neuropathy ([place]) (11/01/2015), Claustrophobia, COPD (chronic obstructive pulmonary disease) ([place]), Depression, Encounter for antineoplastic chemotherapy (02/09/2016), Glaucoma, Headache, Heart murmur, History of echocardiogram, History of nuclear stress test, History of radiation therapy (10/30/16-11/06/16), Hyperlipidemia, Hypertension, Insomnia (05/16/2016), Non-small cell carcinoma of right lung, stage 2 ([place]) (10/03/2015), Radiation  (02/29/16-04/10/16), and Renal cell carcinoma ([place]).   Surgical History    Past Surgical History:  Procedure Laterality Date  . BACK SURGERY     cervical 1991  . EYE SURGERY    . IR GENERIC HISTORICAL  01/16/2017   IR RADIOLOGIST EVAL & MGMT 01/16/2017 [facility]  . IR GENERIC HISTORICAL  01/31/2017   IR FLUORO GUIDED NEEDLE PLC ASPIRATION/INJECTION LOC 01/31/2017 Luanne Bras, MD [facility]  . IR GENERIC HISTORICAL  01/31/2017   IR VERTEBROPLASTY CERV/THOR BX INC UNI/BIL INC/INJECT/IMAGING 01/31/2017 Luanne Bras, MD [facility]  . IR GENERIC HISTORICAL  01/31/2017   IR VERTEBROPLASTY EA ADDL (T&LS) BX INC UNI/BIL INC INJECT/IMAGING 01/31/2017 Luanne Bras, MD [facility]  . kidney cancer    . NEPHRECTOMY    . SPINE SURGERY    . TUBAL LIGATION    . VIDEO ASSISTED THORACOSCOPY (VATS)/WEDGE RESECTION Right 01/18/2016   Procedure: VIDEO ASSISTED THORACOSCOPY (VATS)/LUNG RESECTION, THOROCOTOMY, RIGHT UPPER LOBECTOMY, LYMPH NODE DISSECTION, PLACEMENT OF ON Q;  Surgeon: [name], MD;  Location: [facility];  Service: Thoracic;  Laterality: Right;  Marland Kitchen VIDEO BRONCHOSCOPY Bilateral 09/20/2015   Procedure: VIDEO BRONCHOSCOPY WITHOUT FLUORO;  Surgeon: Rigoberto Noel, MD;  Location: [facility];  Service: Cardiopulmonary;  Laterality: Bilateral;  . VIDEO BRONCHOSCOPY N/A 01/18/2016   Procedure: VIDEO BRONCHOSCOPY;  Surgeon: [name], MD;  Location[facility];  Service: Thoracic;  Laterality: N/A;     Social History   reports that she quit smoking about 7 years ago. Her smoking use included cigarettes. She has a 25.00 pack-year smoking history. She has never used smokeless tobacco. She reports that she does not drink alcohol or use drugs.   Family History   Her family history includes Birth defects in her sister; Breast cancer in her sister; CAD (age of onset: 62) in her mother; Cancer in her maternal grandmother and mother; Glaucoma in her daughter; Heart attack (age of onset:  64) in her daughter; Heart disease in her sister; Hypertension in her mother; Obesity in her brother.   Allergies Allergies  Allergen Reactions  . Bee Venom Anaphylaxis, Shortness Of Breath, Swelling and Other (See Comments)    Swelling at site   . Amlodipine Swelling and Other (See Comments)    Swelling of the ankles and hands   . Levofloxacin Other (See Comments)    Joint pain   . Hctz [Hydrochlorothiazide] Palpitations and Other (See Comments)    Sweating      Home Medications  Prior to Admission medications   Medication Sig Start Date End Date Taking? Authorizing Provider  acetaminophen (TYLENOL) 500 MG tablet Take 500 mg by mouth every 6 (six) hours as needed for mild pain, moderate pain, fever or headache. Reported on 05/24/2016   Yes [provider]  albuterol (VENTOLIN HFA) 108 (90 Base) MCG/ACT inhaler Inhale 1-2 puffs into the lungs every 6 (six) hours as needed for wheezing or shortness of breath. 12/20/18  Yes Azzie Glatter, FNP  Ascorbic Acid (VITAMIN C) 1000 MG tablet Take 1,000 mg by mouth daily.   Yes [provider]  aspirin EC 81 MG tablet Take 81 mg by mouth daily. Reported on 04/11/2016   Yes [provider]  atorvastatin (LIPITOR) 40 MG tablet TAKE ONE TABLET BY MOUTH ONCE DAILY. 11/21/18  Yes Delia Chimes A, MD  calcium carbonate (TUMS EX) 750 MG chewable tablet Chew 1 tablet by mouth as needed for heartburn.   Yes [provider]  Carboxymethylcellulose Sodium (ARTIFICIAL TEARS OP) Place 1 drop into both eyes daily as needed (dry eyes).   Yes [provider]  Cholecalciferol (VITAMIN D3 PO) Take 1,200 mg by mouth daily.   Yes [provider]  fish oil-omega-3 fatty acids 1000 MG capsule Take 1 capsule by mouth daily.    Yes [provider]  fluticasone (FLONASE) 50 MCG/ACT nasal spray Place 1 spray into both nostrils daily.   Yes [provider]  Fluticasone-Salmeterol (ADVAIR) 250-50 MCG/DOSE  AEPB Inhale 1 puff into the lungs 2 (two) times daily. 11/21/18  Yes Delia Chimes A, MD  metoprolol succinate (TOPROL-XL) 50 MG 24 hr tablet Take 1 tablet (50 mg total) by mouth daily. Take with or immediately following a meal. 11/21/18  Yes Stallings, Zoe A, MD  sodium chloride (OCEAN) 0.65 % SOLN nasal spray Place 1 spray into both nostrils as needed for congestion.   Yes [provider]  tizanidine (ZANAFLEX) 2 MG capsule Take 1 capsule (2 mg total) by mouth 3 (three) times daily as needed for muscle spasms. 11/21/18  Yes Forrest Moron, MD  traMADol (ULTRAM) 50 MG tablet Take 1 tablet (50 mg total) by mouth every 6 (six) hours as needed (BACK PAIN). 11/21/18  Yes  Forrest Moron, MD  traZODone (DESYREL) 50 MG tablet Take 0.5-1 tablets (25-50 mg total) by mouth at bedtime as needed for sleep. 11/21/18  Yes Forrest Moron, MD  alendronate (FOSAMAX) 70 MG tablet Take 1 tablet (70 mg total) by mouth every 7 (seven) days. Take with a full glass of water on an empty stomach. 11/21/18   Forrest Moron, MD      Nickolas Madrid, NP 12/29/2018  10:53 AM Pager: [phone] or [phone]

## 2018-12-29 NOTE — Progress Notes (Signed)
[facility]                                                                              Patient Demographics  Tammy Ball, is a 78 y.o. female, DOB - 11-Dec-1940, [place] date - 12/25/2018   Admitting Physician A Melven Sartorius., MD  Outpatient Primary MD for the patient is Tammy Moron, MD  Outpatient specialists:   LOS - 3  days   Medical records reviewed and are as summarized below:    Chief Complaint  Patient presents with  . Shortness of Breath       Brief summary   Patient is a 78 year old female with COPD, lung CA, followed by Dr. Julien Nordmann, renal cell CA, hypertension, hyperlipidemia presented with persistent cough and shortness of breath.  Per patient symptoms started last week, felt sick with sore throat, went to her doctor's office.  Patient had negative flu test and strep test.  She was told it was likely a viral illness, then developed fever with worsening dyspnea and wheezing.   Assessment & Plan    Principal Problem:   Acute respiratory failure with hypoxia (HCC) again due to community-acquired pneumonia and COPD exacerbation -Still short of breath and expiratory wheezing bilaterally, yesterday became very short of breath and tachycardiac, tachypneic in the evening -Changed duo nebs to Xopenex and Atrovent, IV steroids 60 mg every 8 hours -Continue IV Rocephin, discontinued Zithromax -Continue Pulmicort, flutter valve  -Home O2 evaluation when ready -Pulmonology consult obtained for assistance  Active Problems:   Essential hypertension -BP stable    Hyperlipidemia -Continue Lipitor    Non-small cell carcinoma of right lung, stage 2 ([place]) -Stage IIIa, NSCLCa, diagnosed in November 2016, has received neoadjuvant systemic chemotherapy, chemoradiation, mini thoracotomy with right upper lobectomy, completed in 2017, currently under observation, follows Dr. Julien Nordmann  Diarrhea Resolved  Code Status: Full CODE  STATUS DVT Prophylaxis:  Lovenox  Family Communication: Discussed in detail with the patient, all imaging results, lab results explained to the patient   Disposition Plan: Not medically ready for discharge, will likely need at least 24 to 48 hours of IV steroids, scheduled duo nebs.  DC telemetry.   Time Spent in minutes 25 minutes  Procedures:  None  Consultants:   None  Antimicrobials:   Anti-infectives (From admission, onward)   Start     Dose/Rate Route Frequency Ordered Stop   12/26/18 1600  azithromycin (ZITHROMAX) tablet 500 mg  Status:  Discontinued     500 mg Oral Every 24 hours 12/25/18 1951 12/29/18 0858   12/26/18 1500  cefTRIAXone (ROCEPHIN) 1 g in sodium chloride 0.9 % 100 mL IVPB     1 g 200 mL/hr over 30 Minutes Intravenous Every 24 hours 12/25/18 1951 01/01/19 1459   12/25/18 1600  azithromycin (ZITHROMAX) 500 mg in sodium chloride 0.9 % 250 mL IVPB     500 mg 250 mL/hr over 60 Minutes Intravenous  Once 12/25/18 1541 12/25/18 1717   12/25/18 1430  cefTRIAXone (ROCEPHIN) 1 g in sodium chloride 0.9 % 100 mL IVPB     1 g 200 mL/hr over 30 Minutes  Intravenous  Once 12/25/18 1423 12/25/18 1540   12/25/18 1430  azithromycin (ZITHROMAX) 500 mg in sodium chloride 0.9 % 250 mL IVPB  Status:  Discontinued     500 mg 250 mL/hr over 60 Minutes Intravenous  Once 12/25/18 1423 12/25/18 1810         Medications  Scheduled Meds: . aspirin EC  81 mg Oral Daily  . atorvastatin  40 mg Oral q1800  . budesonide (PULMICORT) nebulizer solution  0.5 mg Nebulization BID  . enoxaparin (LOVENOX) injection  40 mg Subcutaneous Q24H  . levalbuterol  0.63 mg Nebulization Q6H   And  . ipratropium  0.5 mg Nebulization Q6H  . methylPREDNISolone (SOLU-MEDROL) injection  60 mg Intravenous Q8H  . metoprolol succinate  25 mg Oral Daily  . pantoprazole  40 mg Oral Q0600   Continuous Infusions: . sodium chloride Stopped (12/25/18 2135)  . cefTRIAXone (ROCEPHIN)  IV 1 g (12/28/18 1403)    PRN Meds:.sodium chloride, acetaminophen **OR** acetaminophen, benzonatate, guaiFENesin-dextromethorphan, hydrALAZINE, levalbuterol, phenol, polyethylene glycol, traMADol, traZODone, white petrolatum      Subjective:   Tammy Ball was seen and examined today.  Still having shortness of breath and wheezing, anxious about the respiratory distress event in the evening yesterday.  States it has never happened before.  No fevers.  Patient denies dizziness, chest pain, abdominal pain, N/V/D/C, new weakness, numbess, tingling.  Objective:   Vitals:   12/28/18 2145 12/29/18 0223 12/29/18 0510 12/29/18 0747  BP:   (!) 149/87   Pulse:   95   Resp:   (!) 22   Temp:   98.4 F (36.9 C)   TempSrc:   Oral   SpO2: 94% 95% 95% 97%  Weight:   79.9 kg   Height:        Intake/Output Summary (Last 24 hours) at 12/29/2018 1205 Last data filed at 12/29/2018 0500 Gross per 24 hour  Intake 700 ml  Output -  Net 700 ml     Wt Readings from Last 3 Encounters:  12/29/18 79.9 kg  12/20/18 80.1 kg  11/27/18 80.3 kg    Physical Exam  General: Alert and oriented x 3, NAD  Eyes:   HEENT:    Cardiovascular: S1 S2 clear, RRR. No pedal edema b/l  Respiratory: Bilateral expiratory wheezing  Gastrointestinal: Soft,NT, ND, NBS  Ext: no pedal edema bilaterally  Neuro: no new deficits  Musculoskeletal: No cyanosis, clubbing  Skin: No rashes  Psych: Normal affect and demeanor, alert and oriented x3     Data Reviewed:  I have personally reviewed following labs and imaging studies  Micro Results Recent Results (from the past 240 hour(s))  Culture, blood (routine x 2)     Status: None (Preliminary result)   Collection Time: 12/25/18  3:02 PM  Result Value Ref Range Status   Specimen Description   Final    BLOOD LEFT ANTECUBITAL Performed at [facility] [address], [place], [place] [address]    Special Requests   Final    BOTTLES DRAWN AEROBIC AND ANAEROBIC  Blood Culture adequate volume Performed at [facility] [address], [place], [place] [address]    Culture   Final    NO GROWTH 3 DAYS Performed at [facility], [place] [address] [place], [place] [address]    Report Status PENDING  Incomplete  Culture, blood (routine x 2)     Status: None (Preliminary result)   Collection Time: 12/25/18  3:02  PM  Result Value Ref Range Status   Specimen Description   Final    BLOOD RIGHT WRIST Performed at [facility] [address], [place], [place] [address]    Special Requests   Final    BOTTLES DRAWN AEROBIC ONLY Blood Culture results may not be optimal due to an inadequate volume of blood received in culture bottles Performed at [facility] [address], [place], [place] [address]    Culture   Final    NO GROWTH 3 DAYS Performed at [facility], [place] [address] [place], [place] [address]    Report Status PENDING  Incomplete  Expectorated sputum assessment w rflx to resp cult     Status: None   Collection Time: 12/25/18  3:16 PM  Result Value Ref Range Status   Specimen Description SPUTUM  Final   Special Requests NONE  Final   Sputum evaluation   Final    THIS SPECIMEN IS ACCEPTABLE FOR SPUTUM CULTURE Performed at [facility], [place] [address], [place], [place] [address]    Report Status 12/25/2018 FINAL  Final  Culture, respiratory     Status: None   Collection Time: 12/25/18  3:16 PM  Result Value Ref Range Status   Specimen Description   Final    SPUTUM Performed at [facility] [address] [place], [place] [address]    Special Requests   Final    NONE Reflexed from [id number] Performed at [facility] [address], [place], [place] [address]    Gram Stain   Final    ABUNDANT WBC PRESENT, PREDOMINANTLY PMN ABUNDANT GRAM POSITIVE COCCI Performed at [facility], [place] [address],  [place], [place] [address]    Culture MODERATE STREPTOCOCCUS PNEUMONIAE  Final   Report Status 12/28/2018 FINAL  Final   Organism ID, Bacteria STREPTOCOCCUS PNEUMONIAE  Final      Susceptibility   Streptococcus pneumoniae - MIC*    ERYTHROMYCIN >=8 RESISTANT Resistant     LEVOFLOXACIN 0.5 SENSITIVE Sensitive     VANCOMYCIN 0.5 SENSITIVE Sensitive     PENICILLIN (non-meningitis) 1 SENSITIVE Sensitive     CEFTRIAXONE (non-meningitis) 1 SENSITIVE Sensitive     * MODERATE STREPTOCOCCUS PNEUMONIAE  Culture, sputum-assessment     Status: None   Collection Time: 12/25/18  7:51 PM  Result Value Ref Range Status   Specimen Description SPUTUM  Final   Special Requests NONE  Final   Sputum evaluation   Final    THIS SPECIMEN IS ACCEPTABLE FOR SPUTUM CULTURE Performed at [facility], [place] [address] [place],  [address]    Report Status 12/28/2018 FINAL  Final  Culture, respiratory     Status: None (Preliminary result)   Collection Time: 12/25/18  7:51 PM  Result Value Ref Range Status   Specimen Description   Final    SPUTUM Performed at [facility] [address], [place],  [address]    Special Requests   Final    NONE Reflexed from [id number] Performed at [facility], [place] [address] [place], [place] [address]    Gram Stain   Final    FEW SQUAMOUS EPITHELIAL CELLS PRESENT MODERATE WBC PRESENT, PREDOMINANTLY PMN FEW GRAM POSITIVE COCCI RARE GRAM POSITIVE RODS    Culture   Final    TOO YOUNG TO READ Performed at [facility], [place] [address], [place], [place]  [address]    Report Status PENDING  Incomplete    Radiology Reports Dg Chest 2 View  Result Date: 12/25/2018 CLINICAL DATA:  Patient here from home with complaints of SOB since last Tuesday. Lung cancer patient. Reports that dr place her on portable O2 on Tuesday. Increase in cough. Productive. Hx COPD, HTN, renal cell carcinoma. EXAM: CHEST - 2 VIEW  COMPARISON:  CT of the chest on 11/25/2018, chest x-ray on 02/19/2018 FINDINGS: Lungs are hyperinflated with. The heart size is normal. No pulmonary edema. No focal consolidations. Focal streaky opacity at the posterior aspect of the lung likely represents atelectasis or early infiltrates in both lung bases. There is mild perihilar peribronchial thickening. Remote UPPER thoracic vertebral augmentation. Remote cervical spine fusion. IMPRESSION: 1. Hyperinflation and bronchitic changes. 2. Bibasilar atelectasis or early infiltrates. Electronically Signed   By: Nolon Nations M.D.   On: 12/25/2018 13:43    Lab Data:  CBC: Recent Labs  Lab 12/25/18 1302 12/26/18 0604 12/27/18 0559 12/28/18 0541 12/29/18 0530  WBC 13.8* 14.2* 8.1 9.3 10.8*  NEUTROABS 11.7*  --   --   --   --   HGB 12.3 11.7* 10.7* 11.4* 12.7  HCT 39.2 37.3 33.5* 36.6 40.0  MCV 100.3* 100.8* 98.8 97.3 97.1  PLT 300 310 313 387 431*   Basic Metabolic Panel: Recent Labs  Lab 12/25/18 1302 12/26/18 0604 12/27/18 0559 12/28/18 0541 12/29/18 0530  NA 132* 130* 132* 136 138  K 4.6 4.4 5.1 4.7 4.7  CL 96* 92* 95* 94* 96*  CO2 27 28 29  32 32  GLUCOSE 109* 125* 145* 164* 134*  BUN 18 16 22  25* 31*  CREATININE 1.13* 0.96 0.82 1.02* 1.05*  CALCIUM 9.0 8.7* 8.7* 9.0 9.5   GFR: Estimated Creatinine Clearance: 47.4 mL/min (A) (by C-G formula based on SCr of 1.05 mg/dL (H)). Liver Function Tests: Recent Labs  Lab 12/26/18 0604  AST 24  ALT 19  ALKPHOS 73  BILITOT 0.9  PROT 7.4  ALBUMIN 3.5   No results for input(s): LIPASE, AMYLASE in the last 168 hours. No results for input(s): AMMONIA in the last 168 hours. Coagulation Profile: No results for input(s): INR, PROTIME in the last 168 hours. Cardiac Enzymes: No results for input(s): CKTOTAL, CKMB, CKMBINDEX, TROPONINI in the last 168 hours. BNP (last 3 results) No results for input(s): PROBNP in the last 8760 hours. HbA1C: No results for input(s): HGBA1C in the  last 72 hours. CBG: No results for input(s): GLUCAP in the last 168 hours. Lipid Profile: No results for input(s): CHOL, HDL, LDLCALC, TRIG, CHOLHDL, LDLDIRECT in the last 72 hours. Thyroid Function Tests: No results for input(s): TSH, T4TOTAL, FREET4, T3FREE, THYROIDAB in the last 72 hours. Anemia Panel: No results for input(s): VITAMINB12, FOLATE, FERRITIN, TIBC, IRON, RETICCTPCT in the last 72 hours. Urine analysis:    Component Value Date/Time   COLORURINE YELLOW 12/25/2018 1320   APPEARANCEUR CLEAR 12/25/2018 1320   LABSPEC 1.016 12/25/2018 1320   LABSPEC 1.010 10/18/2015 0959   PHURINE 5.0 12/25/2018 1320   GLUCOSEU NEGATIVE 12/25/2018 1320   GLUCOSEU Negative 10/18/2015 0959   HGBUR NEGATIVE 12/25/2018 1320   BILIRUBINUR NEGATIVE 12/25/2018 1320   BILIRUBINUR negative 02/19/2018 0904   BILIRUBINUR Negative 10/18/2015 0959   KETONESUR NEGATIVE 12/25/2018 1320   PROTEINUR NEGATIVE 12/25/2018 1320   UROBILINOGEN 0.2 02/19/2018 0904   UROBILINOGEN 0.2 10/18/2015 0959   NITRITE NEGATIVE 12/25/2018 1320   LEUKOCYTESUR MODERATE (A) 12/25/2018 1320   LEUKOCYTESUR Negative  10/18/2015 9688     Estill Cotta M.D. [facility] 12/29/2018, 12:05 PM  Pager: [phone] Between 7am to 7pm - call Pager - [phone]  After 7pm go to [email or web address] - password [email or web address]  Call night coverage person covering after 7pm

## 2018-12-30 DIAGNOSIS — J181 Lobar pneumonia, unspecified organism: Secondary | ICD-10-CM

## 2018-12-30 LAB — CULTURE, BLOOD (ROUTINE X 2)
Culture: NO GROWTH
Culture: NO GROWTH
Special Requests: ADEQUATE

## 2018-12-30 MED ORDER — PREDNISONE 50 MG PO TABS
60.0000 mg | ORAL_TABLET | Freq: Once | ORAL | Status: AC
  Administered 2018-12-30: 60 mg via ORAL
  Filled 2018-12-30: qty 1

## 2018-12-30 MED ORDER — PREDNISONE 10 MG PO TABS: ORAL_TABLET | ORAL | 0 refills | Status: DC

## 2018-12-30 MED ORDER — LEVALBUTEROL HCL 0.63 MG/3ML IN NEBU: 0.6300 mg | INHALATION_SOLUTION | Freq: Three times a day (TID) | RESPIRATORY_TRACT | Status: DC

## 2018-12-30 MED ORDER — METOPROLOL SUCCINATE ER 50 MG PO TB24: 50.0000 mg | ORAL_TABLET | Freq: Every day | ORAL | 1 refills | Status: DC

## 2018-12-30 MED ORDER — ALBUTEROL SULFATE (2.5 MG/3ML) 0.083% IN NEBU: 2.5000 mg | INHALATION_SOLUTION | Freq: Four times a day (QID) | RESPIRATORY_TRACT | 12 refills | Status: DC | PRN

## 2018-12-30 MED ORDER — IPRATROPIUM BROMIDE 0.02 % IN SOLN: 0.5000 mg | Freq: Four times a day (QID) | RESPIRATORY_TRACT | Status: DC

## 2018-12-30 MED ORDER — ORAL CARE MOUTH RINSE
15.0000 mL | Freq: Two times a day (BID) | OROMUCOSAL | Status: DC
  Administered 2018-12-30: 15 mL via OROMUCOSAL

## 2018-12-30 MED ORDER — CEFPODOXIME PROXETIL 100 MG PO TABS: 100.0000 mg | ORAL_TABLET | Freq: Two times a day (BID) | ORAL | 0 refills | Status: AC

## 2018-12-30 MED ORDER — PANTOPRAZOLE SODIUM 40 MG PO TBEC: 40.0000 mg | DELAYED_RELEASE_TABLET | Freq: Every day | ORAL | 3 refills | Status: DC

## 2018-12-30 MED ORDER — IPRATROPIUM BROMIDE 0.02 % IN SOLN: 0.5000 mg | Freq: Three times a day (TID) | RESPIRATORY_TRACT | Status: DC

## 2018-12-30 MED ORDER — GUAIFENESIN-DM 100-10 MG/5ML PO SYRP: 5.0000 mL | ORAL_SOLUTION | ORAL | 0 refills | Status: DC | PRN

## 2018-12-30 MED ORDER — BENZONATATE 100 MG PO CAPS: 100.0000 mg | ORAL_CAPSULE | Freq: Three times a day (TID) | ORAL | 0 refills | Status: DC | PRN

## 2018-12-30 MED ORDER — PREDNISONE 50 MG PO TABS: 60.0000 mg | ORAL_TABLET | Freq: Every day | ORAL | Status: DC

## 2018-12-30 NOTE — Progress Notes (Signed)
SATURATION QUALIFICATIONS: (This note is used to comply with regulatory documentation for home oxygen)  Patient Saturations on Room Air at Rest = 91%  Patient Saturations on Room Air while Ambulating = 80%  Patient Saturations on 3 Liters of oxygen while Ambulating = 93%

## 2018-12-30 NOTE — Discharge Summary (Signed)
Physician Discharge Summary   Patient ID: Tammy Ball MRN: [id number] DOB/AGE: 1941-01-05 78 y.o.  Admit date: 12/25/2018 Discharge date: 12/30/2018  Primary Care Physician:  Tammy Moron, MD   Recommendations for Outpatient Follow-up:  1. Follow up with PCP in 1-2 weeks  Home Health: Patient has home O2, recommended 2 L with ambulation Equipment/Devices:   Discharge Condition: stable  CODE STATUS: FULL  Diet recommendation: Heart healthy diet   Discharge Diagnoses:   . Acute respiratory failure with hypoxia secondary to COPD ([place]) . COPD exacerbation ([place]) . Essential hypertension . Hyperlipidemia . Non-small cell carcinoma of right lung, stage 2 ([place])    Consults: Pulmonology    Allergies:   Allergies  Allergen Reactions  . Bee Venom Anaphylaxis, Shortness Of Breath, Swelling and Other (See Comments)    Swelling at site   . Amlodipine Swelling and Other (See Comments)    Swelling of the ankles and hands   . Levofloxacin Other (See Comments)    Joint pain   . Hctz [Hydrochlorothiazide] Palpitations and Other (See Comments)    Sweating      DISCHARGE MEDICATIONS: Allergies as of 12/30/2018      Reactions   Bee Venom Anaphylaxis, Shortness Of Breath, Swelling, Other (See Comments)   Swelling at site    Amlodipine Swelling, Other (See Comments)   Swelling of the ankles and hands    Levofloxacin Other (See Comments)   Joint pain    Hctz [hydrochlorothiazide] Palpitations, Other (See Comments)   Sweating       Medication List    TAKE these medications   acetaminophen 500 MG tablet Commonly known as:  TYLENOL Take 500 mg by mouth every 6 (six) hours as needed for mild pain, moderate pain, fever or headache. Reported on 05/24/2016   albuterol 108 (90 Base) MCG/ACT inhaler Commonly known as:  VENTOLIN HFA Inhale 1-2 puffs into the lungs every 6 (six) hours as needed for wheezing or shortness of breath. What changed:  Another medication with the  same name was added. Make sure you understand how and when to take each.   albuterol (2.5 MG/3ML) 0.083% nebulizer solution Commonly known as:  PROVENTIL Take 3 mLs (2.5 mg total) by nebulization every 6 (six) hours as needed for wheezing or shortness of breath. What changed:  You were already taking a medication with the same name, and this prescription was added. Make sure you understand how and when to take each.   alendronate 70 MG tablet Commonly known as:  FOSAMAX Take 1 tablet (70 mg total) by mouth every 7 (seven) days. Take with a full glass of water on an empty stomach.   ARTIFICIAL TEARS OP Place 1 drop into both eyes daily as needed (dry eyes).   aspirin EC 81 MG tablet Take 81 mg by mouth daily. Reported on 04/11/2016   atorvastatin 40 MG tablet Commonly known as:  LIPITOR TAKE ONE TABLET BY MOUTH ONCE DAILY.   benzonatate 100 MG capsule Commonly known as:  TESSALON Take 1 capsule (100 mg total) by mouth 3 (three) times daily as needed for cough.   calcium carbonate 750 MG chewable tablet Commonly known as:  TUMS EX Chew 1 tablet by mouth as needed for heartburn.   cefpodoxime 100 MG tablet Commonly known as:  VANTIN Take 1 tablet (100 mg total) by mouth 2 (two) times daily for 4 days.   fish oil-omega-3 fatty acids 1000 MG capsule Take 1 capsule by mouth daily.  fluticasone 50 MCG/ACT nasal spray Commonly known as:  FLONASE Place 1 spray into both nostrils daily.   Fluticasone-Salmeterol 250-50 MCG/DOSE Aepb Commonly known as:  ADVAIR Inhale 1 puff into the lungs 2 (two) times daily.   guaiFENesin-dextromethorphan 100-10 MG/5ML syrup Commonly known as:  ROBITUSSIN DM Take 5 mLs by mouth every 4 (four) hours as needed for cough (chest congestion).   metoprolol succinate 50 MG 24 hr tablet Commonly known as:  TOPROL-XL Take 1 tablet (50 mg total) by mouth daily. Take with or immediately following a meal.   pantoprazole 40 MG tablet Commonly known as:   PROTONIX Take 1 tablet (40 mg total) by mouth daily at 6 (six) AM. Start taking on:  December 31, 2018   predniSONE 10 MG tablet Commonly known as:  DELTASONE Prednisone dosing: Take  Prednisone 40mg  (4 tabs) x 3 days, then taper to 30mg  (3 tabs) x 3 days, then 20mg  (2 tabs) x 3days, then 10mg  (1 tab) x 3days, then OFF.  Dispense:  30 tabs, refills: None   sodium chloride 0.65 % Soln nasal spray Commonly known as:  OCEAN Place 1 spray into both nostrils as needed for congestion.   tizanidine 2 MG capsule Commonly known as:  ZANAFLEX Take 1 capsule (2 mg total) by mouth 3 (three) times daily as needed for muscle spasms.   traMADol 50 MG tablet Commonly known as:  ULTRAM Take 1 tablet (50 mg total) by mouth every 6 (six) hours as needed (BACK PAIN).   traZODone 50 MG tablet Commonly known as:  DESYREL Take 0.5-1 tablets (25-50 mg total) by mouth at bedtime as needed for sleep.   vitamin C 1000 MG tablet Take 1,000 mg by mouth daily.   VITAMIN D3 PO Take 1,200 mg by mouth daily.        Brief H and P: For complete details please refer to admission H and P, but in brief Patient is a 78 year old female with COPD, lung CA, followed by Dr. Julien Ball, renal cell CA, hypertension, hyperlipidemia presented with persistent cough and shortness of breath.  Per patient symptoms started last week, felt sick with sore throat, went to her doctor's office.  Patient had negative flu test and strep test.  She was told it was likely a viral illness, then developed fever with worsening dyspnea and wheezing.    Hospital Course:  Acute respiratory failure with hypoxia (HCC) again due to community-acquired pneumonia and COPD exacerbation -Pulmonology was consulted as patient had very slow and progressive improvement during hospitalization with shortness of breath, expiratory wheezing.   -Patient was placed on scheduled nebs, IV steroids, IV Zithromax and Rocephin, Pulmicort, flutter  valve -Transitioned on prednisone taper, recommended to continue albuterol nebs 3 times a day for next 3 days, antitussives, oral Vantin x 4 days -Urine strep antigen was negative, influenza panel was negative -Chest x-ray 2/17 showed no acute new infiltrates Outpatient pulmonology appointment scheduled with Dr. Lamonte Sakai    Essential hypertension -BP stable    Hyperlipidemia -Continue Lipitor    Non-small cell carcinoma of right lung, stage 2 ([place]) -Stage IIIa, NSCLCa, diagnosed in November 2016, has received neoadjuvant systemic chemotherapy, chemoradiation, mini thoracotomy with right upper lobectomy, completed in 2017, currently under observation, follows Dr. Julien Ball  Day of Discharge S: Feeling a lot better, wheezing has improved, wants to go home  BP (!) 157/95 (BP Location: Right Arm)   Pulse 91   Temp 98.2 F (36.8 C) (Oral)   Resp 20  Ht 5\' 4"  (1.626 m)   Wt 77.2 kg   SpO2 (!) 89%   BMI 29.20 kg/m   Physical Exam: General: Alert and awake oriented x3 not in any acute distress. HEENT: anicteric sclera, pupils reactive to light and accommodation CVS: S1-S2 clear no murmur rubs or gallops Chest: clear to auscultation bilaterally, no wheezing rales or rhonchi Abdomen: soft nontender, nondistended, normal bowel sounds Extremities: no cyanosis, clubbing or edema noted bilaterally Neuro: Cranial nerves II-XII intact, no focal neurological deficits   The results of significant diagnostics from this hospitalization (including imaging, microbiology, ancillary and laboratory) are listed below for reference.      Procedures/Studies:  Dg Chest 2 View  Result Date: 12/29/2018 CLINICAL DATA:  Shortness of breath. EXAM: CHEST - 2 VIEW COMPARISON:  Radiograph of December 25, 2018. FINDINGS: Stable cardiomediastinal silhouette. No pneumothorax or pleural effusion is noted. Both lungs are clear. Status post kyphoplasty at multiple levels of upper thoracic spine. IMPRESSION: No  active cardiopulmonary disease. Electronically Signed   By: Marijo Conception, M.D.   On: 12/29/2018 15:02   Dg Chest 2 View  Result Date: 12/25/2018 CLINICAL DATA:  Patient here from home with complaints of SOB since last Tuesday. Lung cancer patient. Reports that dr place her on portable O2 on Tuesday. Increase in cough. Productive. Hx COPD, HTN, renal cell carcinoma. EXAM: CHEST - 2 VIEW COMPARISON:  CT of the chest on 11/25/2018, chest x-ray on 02/19/2018 FINDINGS: Lungs are hyperinflated with. The heart size is normal. No pulmonary edema. No focal consolidations. Focal streaky opacity at the posterior aspect of the lung likely represents atelectasis or early infiltrates in both lung bases. There is mild perihilar peribronchial thickening. Remote UPPER thoracic vertebral augmentation. Remote cervical spine fusion. IMPRESSION: 1. Hyperinflation and bronchitic changes. 2. Bibasilar atelectasis or early infiltrates. Electronically Signed   By: Nolon Nations M.D.   On: 12/25/2018 13:43       LAB RESULTS: Basic Metabolic Panel: Recent Labs  Lab 12/28/18 0541 12/29/18 0530  NA 136 138  K 4.7 4.7  CL 94* 96*  CO2 32 32  GLUCOSE 164* 134*  BUN 25* 31*  CREATININE 1.02* 1.05*  CALCIUM 9.0 9.5   Liver Function Tests: Recent Labs  Lab 12/26/18 0604  AST 24  ALT 19  ALKPHOS 73  BILITOT 0.9  PROT 7.4  ALBUMIN 3.5   No results for input(s): LIPASE, AMYLASE in the last 168 hours. No results for input(s): AMMONIA in the last 168 hours. CBC: Recent Labs  Lab 12/25/18 1302  12/28/18 0541 12/29/18 0530  WBC 13.8*   < > 9.3 10.8*  NEUTROABS 11.7*  --   --   --   HGB 12.3   < > 11.4* 12.7  HCT 39.2   < > 36.6 40.0  MCV 100.3*   < > 97.3 97.1  PLT 300   < > 387 458*   < > = values in this interval not displayed.   Cardiac Enzymes: No results for input(s): CKTOTAL, CKMB, CKMBINDEX, TROPONINI in the last 168 hours. BNP: Invalid input(s): POCBNP CBG: No results for input(s):  GLUCAP in the last 168 hours.    Disposition and Follow-up: Discharge Instructions    Diet - low sodium heart healthy   Complete by:  As directed    Discharge instructions   Complete by:  As directed    Continue Albuterol Nebs 3 times a day for next 3 days, then twice a day for 3 days,  then every 6 hours as needed for wheezing after that   Increase activity slowly   Complete by:  As directed        DISPOSITION: Home  DISCHARGE FOLLOW-UP Follow-up Information    Tammy Moron, MD. Schedule an appointment as soon as possible for a visit in 2 week(s).   Specialty:  Internal Medicine Contact information: [place] [address] [phone]        Collene Gobble, MD Follow up on 01/06/2019.   Specialty:  Pulmonary Disease Why:  at 10:15am. Please arrive 37mins early. (Lung doctor appt)  Contact information: [address] [place] [address] [phone]            Time coordinating discharge:  35 minutes  Signed:   Estill Cotta M.D. [facility] 12/30/2018, 11:54 AM

## 2018-12-30 NOTE — Progress Notes (Signed)
NAME:  Tammy Ball, MRN:  [id number], DOB:  1941/05/06, LOS: 4 ADMISSION DATE:  12/25/2018, CONSULTATION DATE:  12/29/2018 REFERRING MD:  Tana Coast CHIEF COMPLAINT:  AECOPD   Brief History   78 year old female with history of COPD, lung cancer admitted 213 with COPD exacerbation +/- CAP.  Pulmonary consulted 2/17 for ongoing dyspnea and bronchospasm.  History of present illness   78 year old female former smoker (40 pack years, quit 2012) with history of COPD, hypertension, stage III RUL [place] lung cancer renal cell carcinoma who presented 2/13 with 1 week history of cough, shortness of breath.  She was seen as an outpatient with negative flu and strep swabs.  She was initially improving somewhat but then developed fever and worsening shortness of breath.  She was admitted by [facility] with AECOPD and treated with IV steroids, bronchodilators, empiric antibiotics.  On 2/17 she remained bronchospastic without significant improvement and pulmonary consulted for assistance.  At baseline she takes advair daily and rarely needs rescue SABA.  Still works as Social worker for [employer]  At baseline can do most things without dyspnea - grocery shops, cleans house, cooks, etc.   Past Medical History   has a past medical history of Anemia, Anxiety, Asthma, Chemotherapy-induced neuropathy ([place]) (11/01/2015), Claustrophobia, COPD (chronic obstructive pulmonary disease) ([place]), Depression, Encounter for antineoplastic chemotherapy (02/09/2016), Glaucoma, Headache, Heart murmur, History of echocardiogram, History of nuclear stress test, History of radiation therapy (10/30/16-11/06/16), Hyperlipidemia, Hypertension, Insomnia (05/16/2016), Non-small cell carcinoma of right lung, stage 2 ([place]) (10/03/2015), Radiation (02/29/16-04/10/16), and Renal cell carcinoma ([place]).   Significant Hospital Events     Consults:  Pulmonary 2/17>>  Procedures:    Significant Diagnostic Tests:   Micro Data:  Sputum 2/13 >>strep  pneumo Urine strep 2/13 >>negative BC X2 2/13 >>  Antimicrobials:  Ceftriaxone 2/14 >> Azithro 2/14>>2/16   Interim history/subjective:  She is feeling much better today  Objective   Blood pressure (!) 157/95, pulse 91, temperature 98.2 F (36.8 C), temperature source Oral, resp. rate 20, height 5\' 4"  (1.626 m), weight 77.2 kg, SpO2 (!) 89 %.        Intake/Output Summary (Last 24 hours) at 12/30/2018 1025 Last data filed at 12/30/2018 0900 Gross per 24 hour  Intake 700 ml  Output 1 ml  Net 699 ml   Filed Weights   12/28/18 0528 12/29/18 0510 12/30/18 0500  Weight: 81.3 kg 79.9 kg 77.2 kg    Examination: General: Elderly lady does appear comfortable HENT: Moist oral mucosa Lungs: Occasional rhonchi, improved compared with yesterday Cardiovascular: S1-S2 appreciated Abdomen: Sounds appreciated Extremities: No edema, no clubbing Neuro: Alert and oriented x3  Resolved Hospital Problem list     Assessment & Plan:  Acute hypoxemic respiratory failure Secondary to COPD exacerbation  COPD with exacerbation Continue steroid Complete course of antibiotics  Mobilize as tolerated   She continues to improve at present Agree with possible evaluation for discharge today  We will follow-up in the office  Best practice:   Disposition: Potential discharge today  Labs   CBC: Recent Labs  Lab 12/25/18 1302 12/26/18 0604 12/27/18 0559 12/28/18 0541 12/29/18 0530  WBC 13.8* 14.2* 8.1 9.3 10.8*  NEUTROABS 11.7*  --   --   --   --   HGB 12.3 11.7* 10.7* 11.4* 12.7  HCT 39.2 37.3 33.5* 36.6 40.0  MCV 100.3* 100.8* 98.8 97.3 97.1  PLT 300 310 313 387 458*    Basic Metabolic Panel: Recent Labs  Lab 12/25/18 1302 12/26/18 0604  12/27/18 0559 12/28/18 0541 12/29/18 0530  NA 132* 130* 132* 136 138  K 4.6 4.4 5.1 4.7 4.7  CL 96* 92* 95* 94* 96*  CO2 27 28 29  32 32  GLUCOSE 109* 125* 145* 164* 134*  BUN 18 16 22  25* 31*  CREATININE 1.13* 0.96 0.82 1.02*  1.05*  CALCIUM 9.0 8.7* 8.7* 9.0 9.5   GFR: Estimated Creatinine Clearance: 46.6 mL/min (A) (by C-G formula based on SCr of 1.05 mg/dL (H)). Recent Labs  Lab 12/26/18 0604 12/27/18 0559 12/28/18 0541 12/29/18 0530  WBC 14.2* 8.1 9.3 10.8*    Liver Function Tests: Recent Labs  Lab 12/26/18 0604  AST 24  ALT 19  ALKPHOS 73  BILITOT 0.9  PROT 7.4  ALBUMIN 3.5   No results for input(s): LIPASE, AMYLASE in the last 168 hours. No results for input(s): AMMONIA in the last 168 hours.  ABG    Component Value Date/Time   PHART 7.333 (L) 01/19/2016 0424   PCO2ART 55.5 (H) 01/19/2016 0424   PO2ART 81.0 01/19/2016 0424   HCO3 29.5 (H) 01/19/2016 0424   TCO2 31 01/19/2016 0424   ACIDBASEDEF 1.0 01/18/2016 1120   O2SAT 95.0 01/19/2016 0424     Coagulation Profile: No results for input(s): INR, PROTIME in the last 168 hours.  Cardiac Enzymes: No results for input(s): CKTOTAL, CKMB, CKMBINDEX, TROPONINI in the last 168 hours.  HbA1C: No results found for: HGBA1C  CBG: No results for input(s): GLUCAP in the last 168 hours.  Review of Systems:   Review of Systems  HENT: Negative.   Eyes: Negative.   Respiratory: Positive for sputum production, shortness of breath and wheezing.   Cardiovascular: Negative.   Gastrointestinal: Negative.   Genitourinary: Negative.   Skin: Negative.   All other systems reviewed and are negative.    Past Medical History  She,  has a past medical history of Anemia, Anxiety, Asthma, Chemotherapy-induced neuropathy ([place]) (11/01/2015), Claustrophobia, COPD (chronic obstructive pulmonary disease) ([place]), Depression, Encounter for antineoplastic chemotherapy (02/09/2016), Glaucoma, Headache, Heart murmur, History of echocardiogram, History of nuclear stress test, History of radiation therapy (10/30/16-11/06/16), Hyperlipidemia, Hypertension, Insomnia (05/16/2016), Non-small cell carcinoma of right lung, stage 2 ([place]) (10/03/2015), Radiation  (02/29/16-04/10/16), and Renal cell carcinoma ([place]).      Social History   reports that she quit smoking about 7 years ago. Her smoking use included cigarettes. She has a 25.00 pack-year smoking history. She has never used smokeless tobacco. She reports that she does not drink alcohol or use drugs.   Family History   Her family history includes Birth defects in her sister; Breast cancer in her sister; CAD (age of onset: 19) in her mother; Cancer in her maternal grandmother and mother; Glaucoma in her daughter; Heart attack (age of onset: 64) in her daughter; Heart disease in her sister; Hypertension in her mother; Obesity in her brother.   Allergies Allergies  Allergen Reactions  . Bee Venom Anaphylaxis, Shortness Of Breath, Swelling and Other (See Comments)    Swelling at site   . Amlodipine Swelling and Other (See Comments)    Swelling of the ankles and hands   . Levofloxacin Other (See Comments)    Joint pain   . Hctz [Hydrochlorothiazide] Palpitations and Other (See Comments)    Sweating     Jeyla Bulger,MD

## 2018-12-31 LAB — CULTURE, RESPIRATORY W GRAM STAIN

## 2018-12-31 LAB — CULTURE, RESPIRATORY: Culture: NORMAL

## 2019-01-02 ENCOUNTER — Other Ambulatory Visit: Payer: Self-pay | Admitting: *Deleted

## 2019-01-02 NOTE — Patient Outreach (Signed)
[place] [facility]) Care Management  01/02/2019  NATAYA BASTEDO 1941-01-19 [id number]   Subjective: Telephone call to patient's home  / mobile number, no answer, left HIPAA compliant voicemail message, and requested call back.     Objective: Per KPN (Knowledge Performance Now, point of care tool) and chart review, patient hospitalized 12/25/2018 -12/30/2018 for Acute respiratory failure with hypoxia secondary to COPD.    Patient also has a history of hypertension, hyperlipidemia, and Non-small cell carcinoma of right lung, stage 2.      Assessment: Received Medicare EMMI General Discharge Red Flag Alert referral on 01/02/2019.  Red Flag trigger, Day #1, patient answered no to the following question: Transportation to follow-up?     [facility] EMMI follow up pending patient contact.     Plan: RNCM will send unsuccessful outreach letter, [facility] pamphlet, handout: Know Before You Go, will call patient for 2nd telephone outreach attempt within 4 business days, [facility] EMMI follow up, and proceed with case closure, within 10 business days if no return call.      Kateland Leisinger H. Annia Friendly, BSN, [place] Management [facility] Telephonic CM Phone: [phone] Fax: [phone]

## 2019-01-05 ENCOUNTER — Other Ambulatory Visit: Payer: Self-pay | Admitting: *Deleted

## 2019-01-05 NOTE — Patient Outreach (Addendum)
[facility] [facility]) Care Management  01/05/2019  ALPA SALVO [birth year] [id number]   Subjective: Telephone call to patient's home / mobile number, spoke with patient, and HIPAA verified.  Discussed [facility] Care Management Medicare EMMI General Discharge follow up, patient voiced understanding, and is in agreement to follow up.   Patient states she has had a rough morning with coughing, little better at this time, has a follow up appointment with pulmonologist on 01/06/2019, and with primary MD on 01/12/2019, with was the 1st available appointments.  States she remember receiving the EMMI automated calls, at the time did not have transportation, has worked it out planning to drive self, and has several family / friends that have respiratory illness at this time, planning to keep her distance from them / others due her recent hospitalization with respiratory illness.  Patient states she is able to manage self care and has assistance as needed.   Patient voices understanding of medical diagnosis and treatment plan.  Patient states she does not have any education material, EMMI follow up, care coordination, care management, disease monitoring, transportation, community resource, or pharmacy needs at this time.   States she is very appreciative of the follow up.     Objective: Per KPN (Knowledge Performance Now, point of care tool) and chart review, patient hospitalized 12/25/2018 -12/30/2018 for Acute respiratory failure with hypoxiasecondary to COPD.    Patient also has a history of hypertension, hyperlipidemia, and Non-small cell carcinoma of right lung, stage 2.      Assessment: Received Medicare EMMI General Discharge Red Flag Alert referral on 01/02/2019.  Red Flag trigger, Day #1, patient answered no to the following question: Transportation to follow-up?     EMMI follow up completed and no further care management needs.      Plan: RNCM has sent unsuccessful outreach letter, [facility]  pamphlet, and handout: Know Before You Go. RNCM will complete case closure due to follow up completed / no care management needs.        Jordon Bourquin H. Annia Friendly, BSN, [place] Management [facility] Telephonic CM Phone: [phone] Fax: [phone]

## 2019-01-06 ENCOUNTER — Encounter: Payer: Self-pay | Admitting: Emergency Medicine

## 2019-01-06 ENCOUNTER — Ambulatory Visit (INDEPENDENT_AMBULATORY_CARE_PROVIDER_SITE_OTHER): Payer: Medicare Other | Admitting: Emergency Medicine

## 2019-01-06 DIAGNOSIS — C3491 Malignant neoplasm of unspecified part of right bronchus or lung: Secondary | ICD-10-CM | POA: Diagnosis not present

## 2019-01-06 DIAGNOSIS — J309 Allergic rhinitis, unspecified: Secondary | ICD-10-CM | POA: Insufficient documentation

## 2019-01-06 DIAGNOSIS — J301 Allergic rhinitis due to pollen: Secondary | ICD-10-CM | POA: Diagnosis not present

## 2019-01-06 DIAGNOSIS — J449 Chronic obstructive pulmonary disease, unspecified: Secondary | ICD-10-CM

## 2019-01-06 DIAGNOSIS — J9611 Chronic respiratory failure with hypoxia: Secondary | ICD-10-CM | POA: Diagnosis not present

## 2019-01-06 NOTE — Patient Instructions (Addendum)
Finish your prednisone from your hospitalization as directed Please temporarily stop Advair. We will do a trial of Trelegy 1 inhalation once daily.  Remember to rinse and gargle after using this medication. Try to obtain a copy of your insurance inhaler formulary that we can review Please keep your albuterol available to use either 2 puffs or 1 nebulizer treatment up to every 4 hours if needed for shortness of breath, chest tightness, wheezing. We will repeat your pulmonary function testing at some point in the future when your breathing is at baseline Please restart your fluticasone (Flonase) nasal spray, 2 sprays each nostril once daily until next visit. Keep your oxygen available to use 2 L/min with exertion, goal is to keep your saturations greater than 90% Follow with Dr Lamonte Sakai in 1 month

## 2019-01-06 NOTE — Assessment & Plan Note (Signed)
Continue oxygen at 2 L/min with exertion

## 2019-01-06 NOTE — Assessment & Plan Note (Signed)
Her rhinitis is active right now, she has some harsh upper airway noise.  I think she needs to be on her fluticasone nasal spray every day.  We will reassess at her next visit

## 2019-01-06 NOTE — Progress Notes (Signed)
Patient seen in the office today and instructed on use of Trelegy.  Patient expressed understanding and demonstrated technique.  

## 2019-01-06 NOTE — Progress Notes (Signed)
Subjective:    Patient ID: Tammy Ball, female    DOB: 03-08-1941, 78 y.o.   MRN: [id number]  HPI 78 year old woman with a history of tobacco use (pack years) renal cell carcinoma (2012), squamous cell lung cancer diagnosed by bronchoscopy November 2016 status post RU lobectomy and chemoradiation, hypertension with diastolic dysfunction, COPD with mild obstruction noted on PFT 12/27/2015 that I reviewed. She had normal volumes and a decreased DLCO. She has O2 at home, has had it for the last year.   Most recent CT chest 11/25/18, reviewed by me shows no evidence recurrence, chronic LLL scar, no adenopathy.   She was admitted with an AE-COPD +/- CAP in mid February 2020. Started as URI sx (known sick contact). She has 6 more days of steroids. She is having exertional SOB with chores. She has minimal wheeze, worst at night when she lays down. Her maintenance is Advair, uses albuterol typically only when flaring.    Review of Systems  Constitutional: Positive for fever. Negative for unexpected weight change.  HENT: Positive for sneezing. Negative for congestion, dental problem, ear pain, nosebleeds, postnasal drip, rhinorrhea, sinus pressure, sore throat and trouble swallowing.   Eyes: Negative for redness and itching.  Respiratory: Positive for cough and shortness of breath. Negative for chest tightness and wheezing.   Cardiovascular: Negative for palpitations and leg swelling.  Gastrointestinal: Negative for nausea and vomiting.  Genitourinary: Negative for dysuria.  Musculoskeletal: Negative for joint swelling.  Skin: Negative for rash.  Allergic/Immunologic: Negative.  Negative for environmental allergies, food allergies and immunocompromised state.  Neurological: Negative for headaches.  Hematological: Does not bruise/bleed easily.  Psychiatric/Behavioral: Negative for dysphoric mood. The patient is nervous/anxious.     Past Medical History:  Diagnosis Date  . Anemia    was while  doing chemo  . Anxiety   . Asthma   . Chemotherapy-induced neuropathy ([place]) 11/01/2015  . Claustrophobia   . COPD (chronic obstructive pulmonary disease) ([place])   . Depression   . Encounter for antineoplastic chemotherapy 02/09/2016  . Glaucoma   . Headache    prior to menopause  . Heart murmur   . History of echocardiogram    Echo 2/17: EF 78-46%, grade 1 diastolic dysfunction, mild MR, trivial pericardial effusion  . History of nuclear stress test    Myoview 2/17: no ischemia or scar, EF 79%; low risk  . History of radiation therapy 10/30/16-11/06/16  . Hyperlipidemia   . Hypertension   . Insomnia 05/16/2016  . Non-small cell carcinoma of right lung, stage 2 ([place]) 10/03/2015  . Radiation 02/29/16-04/10/16   50.4 Gy to right central chest  . Renal cell carcinoma ([facility])    L nephrectomy  in 2012     Family History  Problem Relation Age of Onset  . Heart disease Sister   . Obesity Brother   . Heart attack Daughter 26       s/p CABG  . Glaucoma Daughter   . Breast cancer Sister   . Birth defects Sister   . Cancer Mother        Bladder Cancer  . Hypertension Mother   . CAD Mother 95  . Cancer Maternal Grandmother      Social History   Socioeconomic History  . Marital status: Widowed    Spouse name: Not on file  . Number of children: 4  . Years of education: Not on file  . Highest education level: High school graduate  Occupational History  . Not  on file  Social Needs  . Financial resource strain: Not hard at all  . Food insecurity:    Worry: Never true    Inability: Never true  . Transportation needs:    Medical: No    Non-medical: No  Tobacco Use  . Smoking status: Former Smoker    Packs/day: 1.00    Years: 50.00    Pack years: 50.00    Types: Cigarettes    Last attempt to quit: 03/16/2011    Years since quitting: 7.8  . Smokeless tobacco: Never Used  . Tobacco comment: last 4-5 years of smoking, smoked 0.5 pack/day   Substance and Sexual Activity  .  Alcohol use: No    Alcohol/week: 0.0 standard drinks  . Drug use: No  . Sexual activity: Not Currently  Lifestyle  . Physical activity:    Days per week: 0 days    Minutes per session: 0 min  . Stress: Only a little  Relationships  . Social connections:    Talks on phone: More than three times a week    Gets together: More than three times a week    Attends religious service: More than 4 times per year    Active member of club or organization: Yes    Attends meetings of clubs or organizations: More than 4 times per year    Relationship status: Widowed  . Intimate partner violence:    Fear of current or ex partner: No    Emotionally abused: No    Physically abused: No    Forced sexual activity: No  Other Topics Concern  . Not on file  Social History Narrative   Marital status: divorced; not dating in 2019.      Children: 4 children; 3 grandchildren adult; 4 gg.      Lives: alone in house      Employment: part-time work; substance abuse counselor; [employer].      Tobacco: quit smoking 2012; smoked 45 years      Alcohol: none      Drugs: none      Exercise: none in 2019; due to LEFT sciatica.      ADLs: independent with ADLs; drives.       Advanced Directives: YES: HCPOA: Nicholas Martinez/son.  FULL CODE but no prolonged measures.      Occupation: Substance Abuse Estate agent   No exercise** Merged History Encounter **       ** Data from: 12/14/11 Enc Dept: [facility]       ** Data from: 12/17/11 Enc Dept: [facility]   Substance abuse counselor   Husband deceased   4 great grandchildren   Son works in same substance abuse counseling center as patient  she is a substance abuse Social worker.  [place] native, has also lived in [place], [place], [place], [place], [place].   Allergies  Allergen Reactions  . Bee Venom Anaphylaxis, Shortness Of Breath, Swelling and Other (See Comments)    Swelling at site   . Amlodipine Swelling and Other (See Comments)     Swelling of the ankles and hands   . Levofloxacin Other (See Comments)    Joint pain   . Hctz [Hydrochlorothiazide] Palpitations and Other (See Comments)    Sweating      Outpatient Medications Prior to Visit  Medication Sig Dispense Refill  . acetaminophen (TYLENOL) 500 MG tablet Take 500 mg by mouth every 6 (six) hours as needed  for mild pain, moderate pain, fever or headache. Reported on 05/24/2016    . albuterol (PROVENTIL) (2.5 MG/3ML) 0.083% nebulizer solution Take 3 mLs (2.5 mg total) by nebulization every 6 (six) hours as needed for wheezing or shortness of breath. 75 mL 12  . albuterol (VENTOLIN HFA) 108 (90 Base) MCG/ACT inhaler Inhale 1-2 puffs into the lungs every 6 (six) hours as needed for wheezing or shortness of breath. 1 Inhaler 6  . Ascorbic Acid (VITAMIN C) 1000 MG tablet Take 1,000 mg by mouth daily.    Marland Kitchen aspirin EC 81 MG tablet Take 81 mg by mouth daily. Reported on 04/11/2016    . atorvastatin (LIPITOR) 40 MG tablet TAKE ONE TABLET BY MOUTH ONCE DAILY. 90 tablet 1  . calcium carbonate (TUMS EX) 750 MG chewable tablet Chew 1 tablet by mouth as needed for heartburn.    . Carboxymethylcellulose Sodium (ARTIFICIAL TEARS OP) Place 1 drop into both eyes daily as needed (dry eyes).    . Cholecalciferol (VITAMIN D3 PO) Take 1,200 mg by mouth daily.    . fish oil-omega-3 fatty acids 1000 MG capsule Take 1 capsule by mouth daily.     . fluticasone (FLONASE) 50 MCG/ACT nasal spray Place 1 spray into both nostrils daily.    . Fluticasone-Salmeterol (ADVAIR) 250-50 MCG/DOSE AEPB Inhale 1 puff into the lungs 2 (two) times daily. 180 each 3  . guaiFENesin-dextromethorphan (ROBITUSSIN DM) 100-10 MG/5ML syrup Take 5 mLs by mouth every 4 (four) hours as needed for cough (chest congestion). 118 mL 0  . metoprolol succinate (TOPROL-XL) 50 MG 24 hr tablet Take 1 tablet (50 mg total) by mouth daily. Take with or immediately following a meal. 90 tablet 1  . pantoprazole (PROTONIX) 40 MG  tablet Take 1 tablet (40 mg total) by mouth daily at 6 (six) AM. 30 tablet 3  . predniSONE (DELTASONE) 10 MG tablet Prednisone dosing: Take  Prednisone 40mg  (4 tabs) x 3 days, then taper to 30mg  (3 tabs) x 3 days, then 20mg  (2 tabs) x 3days, then 10mg  (1 tab) x 3days, then OFF.  Dispense:  30 tabs, refills: None 30 tablet 0  . sodium chloride (OCEAN) 0.65 % SOLN nasal spray Place 1 spray into both nostrils as needed for congestion.    . traZODone (DESYREL) 50 MG tablet Take 0.5-1 tablets (25-50 mg total) by mouth at bedtime as needed for sleep. 90 tablet 3  . alendronate (FOSAMAX) 70 MG tablet Take 1 tablet (70 mg total) by mouth every 7 (seven) days. Take with a full glass of water on an empty stomach. (Patient not taking: Reported on 01/06/2019) 12 tablet 3  . benzonatate (TESSALON) 100 MG capsule Take 1 capsule (100 mg total) by mouth 3 (three) times daily as needed for cough. (Patient not taking: Reported on 01/06/2019) 20 capsule 0  . tizanidine (ZANAFLEX) 2 MG capsule Take 1 capsule (2 mg total) by mouth 3 (three) times daily as needed for muscle spasms. (Patient not taking: Reported on 01/06/2019) 90 capsule 1  . traMADol (ULTRAM) 50 MG tablet Take 1 tablet (50 mg total) by mouth every 6 (six) hours as needed (BACK PAIN). (Patient not taking: Reported on 01/06/2019) 90 tablet 0   No facility-administered medications prior to visit.         Objective:   Physical Exam Vitals:   01/06/19 1023  BP: 138/68  Pulse: (!) 106  SpO2: 95%  Weight: 173 lb 12.8 oz (78.8 kg)  Height: 5\' 3"  (1.6 m)  Gen: Pleasant, elderly woman, in no distress,  normal affect  ENT: No lesions,  mouth clear,  oropharynx clear, no postnasal drip  Neck: No JVD, harsh UA noise  Lungs: No use of accessory muscles, no crackles or wheezing on normal respiration, referred UA noise  Cardiovascular: RRR, heart sounds normal, no murmur or gallops, no peripheral edema  Musculoskeletal: No deformities, no cyanosis or  clubbing  Neuro: alert, awake, non focal  Skin: Warm, no lesions or rash      Assessment & Plan:  COPD GOLD II  Recovering from recent significant exacerbation with hospitalization, possible superimposed pneumonia.  She is not back to baseline but she is improving.  I would expect her to be back to baseline over the next 1 to 2 months.  I like to add LAMA to her regimen.  Repeat her pulmonary function testing when she is back to baseline.  Finish your prednisone from your hospitalization as directed Please temporarily stop Advair. We will do a trial of Trelegy 1 inhalation once daily.  Remember to rinse and gargle after using this medication. Try to obtain a copy of your insurance inhaler formulary that we can review Please keep your albuterol available to use either 2 puffs or 1 nebulizer treatment up to every 4 hours if needed for shortness of breath, chest tightness, wheezing. We will repeat your pulmonary function testing at some point in the future when your breathing is at baseline Follow with Dr Lamonte Sakai in 1 month  Chronic respiratory failure with hypoxia (HCC) Continue oxygen at 2 L/min with exertion  Non-small cell carcinoma of right lung, stage 2 [facility]) Following with Dr. Earlie Server, no evidence recurrence on most recent CT 11/25/2018  Allergic rhinitis Her rhinitis is active right now, she has some harsh upper airway noise.  I think she needs to be on her fluticasone nasal spray every day.  We will reassess at her next visit   Baltazar Apo, MD, PhD 01/06/2019, 11:07 AM [facility] [phone] or if no answer [phone]

## 2019-01-06 NOTE — Assessment & Plan Note (Signed)
Following with Dr. Earlie Server, no evidence recurrence on most recent CT 11/25/2018

## 2019-01-06 NOTE — Assessment & Plan Note (Signed)
Recovering from recent significant exacerbation with hospitalization, possible superimposed pneumonia.  She is not back to baseline but she is improving.  I would expect her to be back to baseline over the next 1 to 2 months.  I like to add LAMA to her regimen.  Repeat her pulmonary function testing when she is back to baseline.  Finish your prednisone from your hospitalization as directed Please temporarily stop Advair. We will do a trial of Trelegy 1 inhalation once daily.  Remember to rinse and gargle after using this medication. Try to obtain a copy of your insurance inhaler formulary that we can review Please keep your albuterol available to use either 2 puffs or 1 nebulizer treatment up to every 4 hours if needed for shortness of breath, chest tightness, wheezing. We will repeat your pulmonary function testing at some point in the future when your breathing is at baseline Follow with Dr Lamonte Sakai in 1 month

## 2019-01-07 ENCOUNTER — Emergency Department (HOSPITAL_COMMUNITY): Payer: Medicare Other

## 2019-01-07 ENCOUNTER — Ambulatory Visit: Payer: Self-pay

## 2019-01-07 ENCOUNTER — Inpatient Hospital Stay (HOSPITAL_COMMUNITY): Payer: Medicare Other

## 2019-01-07 ENCOUNTER — Other Ambulatory Visit: Payer: Self-pay

## 2019-01-07 ENCOUNTER — Encounter (HOSPITAL_COMMUNITY): Payer: Self-pay

## 2019-01-07 ENCOUNTER — Inpatient Hospital Stay (HOSPITAL_COMMUNITY)
Admission: EM | Admit: 2019-01-07 | Discharge: 2019-01-11 | DRG: 193 | Disposition: A | Discharge status: Home / Self Care | Payer: Medicare Other | Attending: Internal Medicine | Admitting: Internal Medicine

## 2019-01-07 ENCOUNTER — Other Ambulatory Visit (HOSPITAL_COMMUNITY): Payer: Self-pay

## 2019-01-07 DIAGNOSIS — J189 Pneumonia, unspecified organism: Secondary | ICD-10-CM | POA: Diagnosis not present

## 2019-01-07 DIAGNOSIS — Z87891 Personal history of nicotine dependence: Secondary | ICD-10-CM | POA: Diagnosis not present

## 2019-01-07 DIAGNOSIS — K219 Gastro-esophageal reflux disease without esophagitis: Secondary | ICD-10-CM | POA: Diagnosis present

## 2019-01-07 DIAGNOSIS — T380X5A Adverse effect of glucocorticoids and synthetic analogues, initial encounter: Secondary | ICD-10-CM | POA: Diagnosis present

## 2019-01-07 DIAGNOSIS — C3491 Malignant neoplasm of unspecified part of right bronchus or lung: Secondary | ICD-10-CM | POA: Diagnosis present

## 2019-01-07 DIAGNOSIS — Z905 Acquired absence of kidney: Secondary | ICD-10-CM

## 2019-01-07 DIAGNOSIS — Z8701 Personal history of pneumonia (recurrent): Secondary | ICD-10-CM

## 2019-01-07 DIAGNOSIS — J700 Acute pulmonary manifestations due to radiation: Secondary | ICD-10-CM | POA: Diagnosis present

## 2019-01-07 DIAGNOSIS — I10 Essential (primary) hypertension: Secondary | ICD-10-CM | POA: Diagnosis present

## 2019-01-07 DIAGNOSIS — Z8249 Family history of ischemic heart disease and other diseases of the circulatory system: Secondary | ICD-10-CM

## 2019-01-07 DIAGNOSIS — F329 Major depressive disorder, single episode, unspecified: Secondary | ICD-10-CM | POA: Diagnosis present

## 2019-01-07 DIAGNOSIS — J441 Chronic obstructive pulmonary disease with (acute) exacerbation: Secondary | ICD-10-CM | POA: Diagnosis present

## 2019-01-07 DIAGNOSIS — J449 Chronic obstructive pulmonary disease, unspecified: Secondary | ICD-10-CM | POA: Diagnosis not present

## 2019-01-07 DIAGNOSIS — J309 Allergic rhinitis, unspecified: Secondary | ICD-10-CM | POA: Diagnosis present

## 2019-01-07 DIAGNOSIS — Z7982 Long term (current) use of aspirin: Secondary | ICD-10-CM

## 2019-01-07 DIAGNOSIS — J440 Chronic obstructive pulmonary disease with acute lower respiratory infection: Secondary | ICD-10-CM | POA: Diagnosis present

## 2019-01-07 DIAGNOSIS — J9621 Acute and chronic respiratory failure with hypoxia: Secondary | ICD-10-CM | POA: Diagnosis present

## 2019-01-07 DIAGNOSIS — Z981 Arthrodesis status: Secondary | ICD-10-CM

## 2019-01-07 DIAGNOSIS — Z91030 Bee allergy status: Secondary | ICD-10-CM

## 2019-01-07 DIAGNOSIS — R918 Other nonspecific abnormal finding of lung field: Secondary | ICD-10-CM | POA: Diagnosis not present

## 2019-01-07 DIAGNOSIS — E782 Mixed hyperlipidemia: Secondary | ICD-10-CM | POA: Diagnosis not present

## 2019-01-07 DIAGNOSIS — Z8052 Family history of malignant neoplasm of bladder: Secondary | ICD-10-CM

## 2019-01-07 DIAGNOSIS — J154 Pneumonia due to other streptococci: Secondary | ICD-10-CM | POA: Diagnosis present

## 2019-01-07 DIAGNOSIS — Z888 Allergy status to other drugs, medicaments and biological substances status: Secondary | ICD-10-CM

## 2019-01-07 DIAGNOSIS — E785 Hyperlipidemia, unspecified: Secondary | ICD-10-CM | POA: Diagnosis present

## 2019-01-07 DIAGNOSIS — Z923 Personal history of irradiation: Secondary | ICD-10-CM

## 2019-01-07 DIAGNOSIS — Z7952 Long term (current) use of systemic steroids: Secondary | ICD-10-CM

## 2019-01-07 DIAGNOSIS — R0982 Postnasal drip: Secondary | ICD-10-CM

## 2019-01-07 DIAGNOSIS — R0602 Shortness of breath: Secondary | ICD-10-CM | POA: Diagnosis not present

## 2019-01-07 DIAGNOSIS — Z902 Acquired absence of lung [part of]: Secondary | ICD-10-CM

## 2019-01-07 DIAGNOSIS — F40240 Claustrophobia: Secondary | ICD-10-CM | POA: Diagnosis present

## 2019-01-07 DIAGNOSIS — Z9221 Personal history of antineoplastic chemotherapy: Secondary | ICD-10-CM | POA: Diagnosis not present

## 2019-01-07 DIAGNOSIS — Z85118 Personal history of other malignant neoplasm of bronchus and lung: Secondary | ICD-10-CM | POA: Diagnosis not present

## 2019-01-07 DIAGNOSIS — Z881 Allergy status to other antibiotic agents status: Secondary | ICD-10-CM | POA: Diagnosis not present

## 2019-01-07 DIAGNOSIS — Z803 Family history of malignant neoplasm of breast: Secondary | ICD-10-CM

## 2019-01-07 DIAGNOSIS — G4700 Insomnia, unspecified: Secondary | ICD-10-CM | POA: Diagnosis present

## 2019-01-07 DIAGNOSIS — Z85528 Personal history of other malignant neoplasm of kidney: Secondary | ICD-10-CM

## 2019-01-07 DIAGNOSIS — F32A Depression, unspecified: Secondary | ICD-10-CM | POA: Diagnosis present

## 2019-01-07 DIAGNOSIS — Y95 Nosocomial condition: Secondary | ICD-10-CM | POA: Diagnosis present

## 2019-01-07 DIAGNOSIS — C649 Malignant neoplasm of unspecified kidney, except renal pelvis: Secondary | ICD-10-CM | POA: Diagnosis present

## 2019-01-07 DIAGNOSIS — R002 Palpitations: Secondary | ICD-10-CM | POA: Diagnosis not present

## 2019-01-07 LAB — COMPREHENSIVE METABOLIC PANEL
ALT: 20 U/L (ref 0–44)
AST: 19 U/L (ref 15–41)
Albumin: 3.9 g/dL (ref 3.5–5.0)
Alkaline Phosphatase: 65 U/L (ref 38–126)
Anion gap: 8 (ref 5–15)
BUN: 20 mg/dL (ref 8–23)
CO2: 34 mmol/L — ABNORMAL HIGH (ref 22–32)
Calcium: 9.4 mg/dL (ref 8.9–10.3)
Chloride: 95 mmol/L — ABNORMAL LOW (ref 98–111)
Creatinine, Ser: 1.18 mg/dL — ABNORMAL HIGH (ref 0.44–1.00)
GFR calc Af Amer: 52 mL/min — ABNORMAL LOW (ref >60)
GFR calc non Af Amer: 45 mL/min — ABNORMAL LOW (ref >60)
Glucose, Bld: 133 mg/dL — ABNORMAL HIGH (ref 70–99)
Potassium: 4.5 mmol/L (ref 3.5–5.1)
Sodium: 137 mmol/L (ref 135–145)
Total Bilirubin: 1.3 mg/dL — ABNORMAL HIGH (ref 0.3–1.2)
Total Protein: 7.3 g/dL (ref 6.5–8.1)

## 2019-01-07 LAB — CBC WITH DIFFERENTIAL/PLATELET
Abs Immature Granulocytes: 0.34 10*3/uL — ABNORMAL HIGH (ref 0.00–0.07)
Basophils Absolute: 0 10*3/uL (ref 0.0–0.1)
Basophils Relative: 0 %
Eosinophils Absolute: 0 10*3/uL (ref 0.0–0.5)
Eosinophils Relative: 0 %
HCT: 39 % (ref 36.0–46.0)
Hemoglobin: 12.5 g/dL (ref 12.0–15.0)
Immature Granulocytes: 2 %
Lymphocytes Relative: 4 %
Lymphs Abs: 0.7 10*3/uL (ref 0.7–4.0)
MCH: 31.4 pg (ref 26.0–34.0)
MCHC: 32.1 g/dL (ref 30.0–36.0)
MCV: 98 fL (ref 80.0–100.0)
Monocytes Absolute: 0.5 10*3/uL (ref 0.1–1.0)
Monocytes Relative: 2 %
Neutro Abs: 17.7 10*3/uL — ABNORMAL HIGH (ref 1.7–7.7)
Neutrophils Relative %: 92 %
Platelets: 289 10*3/uL (ref 150–400)
RBC: 3.98 MIL/uL (ref 3.87–5.11)
RDW: 16.8 % — ABNORMAL HIGH (ref 11.5–15.5)
WBC: 19.2 10*3/uL — ABNORMAL HIGH (ref 4.0–10.5)
nRBC: 0 % (ref 0.0–0.2)

## 2019-01-07 LAB — INFLUENZA PANEL BY PCR (TYPE A & B)
Influenza A By PCR: NEGATIVE
Influenza B By PCR: NEGATIVE

## 2019-01-07 MED ORDER — ASPIRIN EC 81 MG PO TBEC
81.0000 mg | DELAYED_RELEASE_TABLET | Freq: Every day | ORAL | Status: DC
  Administered 2019-01-08 – 2019-01-11 (×4): 81 mg via ORAL
  Filled 2019-01-07 (×4): qty 1

## 2019-01-07 MED ORDER — DM-GUAIFENESIN ER 30-600 MG PO TB12
1.0000 | ORAL_TABLET | Freq: Two times a day (BID) | ORAL | Status: DC
  Administered 2019-01-07 – 2019-01-11 (×9): 1 via ORAL
  Filled 2019-01-07 (×9): qty 1

## 2019-01-07 MED ORDER — IPRATROPIUM-ALBUTEROL 0.5-2.5 (3) MG/3ML IN SOLN
3.0000 mL | Freq: Three times a day (TID) | RESPIRATORY_TRACT | Status: DC
  Administered 2019-01-08 – 2019-01-09 (×4): 3 mL via RESPIRATORY_TRACT
  Filled 2019-01-07 (×5): qty 3

## 2019-01-07 MED ORDER — IPRATROPIUM-ALBUTEROL 0.5-2.5 (3) MG/3ML IN SOLN
3.0000 mL | Freq: Four times a day (QID) | RESPIRATORY_TRACT | Status: DC
  Administered 2019-01-07: 3 mL via RESPIRATORY_TRACT
  Filled 2019-01-07: qty 3

## 2019-01-07 MED ORDER — ENOXAPARIN SODIUM 40 MG/0.4ML ~~LOC~~ SOLN
40.0000 mg | SUBCUTANEOUS | Status: DC
  Administered 2019-01-07 – 2019-01-10 (×4): 40 mg via SUBCUTANEOUS
  Filled 2019-01-07 (×4): qty 0.4

## 2019-01-07 MED ORDER — ALBUTEROL SULFATE (2.5 MG/3ML) 0.083% IN NEBU
2.5000 mg | INHALATION_SOLUTION | RESPIRATORY_TRACT | Status: DC | PRN
  Administered 2019-01-08: 2.5 mg via RESPIRATORY_TRACT
  Filled 2019-01-07: qty 3

## 2019-01-07 MED ORDER — MOMETASONE FURO-FORMOTEROL FUM 200-5 MCG/ACT IN AERO
2.0000 | INHALATION_SPRAY | Freq: Two times a day (BID) | RESPIRATORY_TRACT | Status: DC
  Administered 2019-01-08 – 2019-01-09 (×3): 2 via RESPIRATORY_TRACT
  Filled 2019-01-07 (×2): qty 8.8

## 2019-01-07 MED ORDER — BENZONATATE 100 MG PO CAPS
100.0000 mg | ORAL_CAPSULE | Freq: Three times a day (TID) | ORAL | Status: DC
  Administered 2019-01-07 – 2019-01-11 (×11): 100 mg via ORAL
  Filled 2019-01-07 (×11): qty 1

## 2019-01-07 MED ORDER — FLUTICASONE PROPIONATE 50 MCG/ACT NA SUSP
2.0000 | Freq: Every day | NASAL | Status: DC
  Administered 2019-01-08 – 2019-01-10 (×3): 2 via NASAL
  Filled 2019-01-07: qty 16

## 2019-01-07 MED ORDER — METHYLPREDNISOLONE SODIUM SUCC 125 MG IJ SOLR
125.0000 mg | Freq: Once | INTRAMUSCULAR | Status: AC
  Administered 2019-01-07: 125 mg via INTRAVENOUS
  Filled 2019-01-07: qty 2

## 2019-01-07 MED ORDER — SALINE SPRAY 0.65 % NA SOLN
1.0000 | NASAL | Status: DC | PRN
  Administered 2019-01-08: 1 via NASAL

## 2019-01-07 MED ORDER — VANCOMYCIN HCL IN DEXTROSE 1-5 GM/200ML-% IV SOLN
1000.0000 mg | Freq: Once | INTRAVENOUS | Status: AC
  Administered 2019-01-07: 1000 mg via INTRAVENOUS
  Filled 2019-01-07: qty 200

## 2019-01-07 MED ORDER — PIPERACILLIN-TAZOBACTAM 3.375 G IVPB 30 MIN
3.3750 g | Freq: Once | INTRAVENOUS | Status: AC
  Administered 2019-01-07: 3.375 g via INTRAVENOUS
  Filled 2019-01-07: qty 50

## 2019-01-07 MED ORDER — MAGNESIUM SULFATE 2 GM/50ML IV SOLN
2.0000 g | Freq: Once | INTRAVENOUS | Status: AC
  Administered 2019-01-07: 2 g via INTRAVENOUS
  Filled 2019-01-07: qty 50

## 2019-01-07 MED ORDER — LACTATED RINGERS IV SOLN
INTRAVENOUS | Status: DC
  Administered 2019-01-07 – 2019-01-08 (×2): via INTRAVENOUS

## 2019-01-07 MED ORDER — TIZANIDINE HCL 2 MG PO TABS
2.0000 mg | ORAL_TABLET | Freq: Three times a day (TID) | ORAL | Status: DC | PRN
  Filled 2019-01-07: qty 1

## 2019-01-07 MED ORDER — IPRATROPIUM-ALBUTEROL 0.5-2.5 (3) MG/3ML IN SOLN
3.0000 mL | Freq: Once | RESPIRATORY_TRACT | Status: AC
  Administered 2019-01-07: 3 mL via RESPIRATORY_TRACT
  Filled 2019-01-07: qty 3

## 2019-01-07 MED ORDER — TRAMADOL HCL 50 MG PO TABS
50.0000 mg | ORAL_TABLET | Freq: Four times a day (QID) | ORAL | Status: DC | PRN
  Administered 2019-01-09: 50 mg via ORAL
  Filled 2019-01-07 (×2): qty 1

## 2019-01-07 MED ORDER — ALBUTEROL SULFATE (2.5 MG/3ML) 0.083% IN NEBU
2.5000 mg | INHALATION_SOLUTION | Freq: Once | RESPIRATORY_TRACT | Status: AC
  Administered 2019-01-07: 2.5 mg via RESPIRATORY_TRACT
  Filled 2019-01-07: qty 3

## 2019-01-07 MED ORDER — METHYLPREDNISOLONE SODIUM SUCC 40 MG IJ SOLR
40.0000 mg | Freq: Two times a day (BID) | INTRAMUSCULAR | Status: DC
  Administered 2019-01-08: 40 mg via INTRAVENOUS
  Filled 2019-01-07: qty 1

## 2019-01-07 MED ORDER — TRAZODONE HCL 50 MG PO TABS
25.0000 mg | ORAL_TABLET | Freq: Every evening | ORAL | Status: DC | PRN
  Administered 2019-01-08 (×2): 50 mg via ORAL
  Filled 2019-01-07 (×2): qty 1

## 2019-01-07 MED ORDER — PANTOPRAZOLE SODIUM 40 MG PO TBEC
40.0000 mg | DELAYED_RELEASE_TABLET | Freq: Every day | ORAL | Status: DC
  Administered 2019-01-08 – 2019-01-11 (×4): 40 mg via ORAL
  Filled 2019-01-07 (×4): qty 1

## 2019-01-07 MED ORDER — METOPROLOL SUCCINATE ER 50 MG PO TB24
50.0000 mg | ORAL_TABLET | Freq: Every day | ORAL | Status: DC
  Administered 2019-01-08 – 2019-01-11 (×4): 50 mg via ORAL
  Filled 2019-01-07 (×4): qty 1

## 2019-01-07 MED ORDER — SODIUM CHLORIDE 0.9 % IV SOLN
1.0000 g | Freq: Two times a day (BID) | INTRAVENOUS | Status: DC
  Administered 2019-01-08 – 2019-01-10 (×6): 1 g via INTRAVENOUS
  Administered 2019-01-10: via INTRAVENOUS
  Filled 2019-01-07 (×8): qty 1

## 2019-01-07 MED ORDER — ATORVASTATIN CALCIUM 40 MG PO TABS
40.0000 mg | ORAL_TABLET | Freq: Every day | ORAL | Status: DC
  Administered 2019-01-08 – 2019-01-10 (×3): 40 mg via ORAL
  Filled 2019-01-07 (×4): qty 1

## 2019-01-07 MED ORDER — VANCOMYCIN HCL IN DEXTROSE 1-5 GM/200ML-% IV SOLN: 1000.0000 mg | INTRAVENOUS | Status: DC

## 2019-01-07 NOTE — Progress Notes (Signed)
Pharmacy Antibiotic Note  Tammy Ball is a 78 y.o. female admitted on 01/07/2019 with complaints of cough and SOB since Sunday.  She was discharged from the hospital 5 days prior to Sunday for COPD exacerbation.  Pt has hx of non-small cell carcinoma of rt lung.  Pharmacy has been consulted for vancomycin dosing for pna.  Plan: Vancomycin 1gm IV x 1 then 1gm q36h (AUC 521, Scr 1.18, Cmin 11.8) Cefepime 1gm IV q8h per MD, renally adjusted to 1gm q12h, Follow renal function, cultures and clinical course  Height: 5\' 3"  (160 cm) Weight: 172 lb 9.9 oz (78.3 kg) IBW/kg (Calculated) : 52.4  Temp (24hrs), Avg:98.9 F (37.2 C), Min:98.9 F (37.2 C), Max:98.9 F (37.2 C)  Recent Labs  Lab 01/07/19 1259  WBC 19.2*  CREATININE 1.18*    Estimated Creatinine Clearance: 40.8 mL/min (A) (by C-G formula based on SCr of 1.18 mg/dL (H)).    Allergies  Allergen Reactions  . Bee Venom Anaphylaxis, Shortness Of Breath, Swelling and Other (See Comments)    Swelling at site   . Amlodipine Swelling and Other (See Comments)    Swelling of the ankles and hands   . Levofloxacin Other (See Comments)    Joint pain   . Hctz [Hydrochlorothiazide] Palpitations and Other (See Comments)    Sweating     Antimicrobials this admission: 2/26 vancomycin >> 2/26 cefepime >> 2/26 zosyn x 1  Dose adjustments this admission:   Microbiology results: 2/26 Sputum: sent 2/26 trachea: sent  Thank you for allowing pharmacy to be a part of this patient's care.  Dolly Rias RPh 01/07/2019, 3:36 PM Pager [phone]

## 2019-01-07 NOTE — ED Notes (Signed)
PT CURRENTLY HAS OXYGEN CONDENSER AT BEDSIDE. [place]

## 2019-01-07 NOTE — ED Notes (Signed)
ADMISSION Provider at bedside. 

## 2019-01-07 NOTE — ED Provider Notes (Signed)
[facility] DEPT Provider Note   CSN: [id number] Arrival date & time: 01/07/19  1032    History   Chief Complaint Chief Complaint  Patient presents with  . Shortness of Breath  . Cough  . Fever    HPI REALITY DEJONGE is a 78 y.o. female.     Patient complains of cough shortness of breath since Sunday.  She was discharged from the hospital 5 days prior to Sunday for COPD exacerbation.  She states that she felt fine for 5 days and then started with cough and shortness of breath  The history is provided by the patient. No language interpreter was used.  Shortness of Breath  Severity:  Moderate Onset quality:  Sudden Timing:  Constant Progression:  Worsening Chronicity:  New Context: activity   Relieved by:  Nothing Worsened by:  Nothing Ineffective treatments:  None tried Associated symptoms: cough and fever   Associated symptoms: no abdominal pain, no chest pain, no headaches and no rash   Risk factors: no recent alcohol use   Cough  Associated symptoms: fever and shortness of breath   Associated symptoms: no chest pain, no eye discharge, no headaches and no rash   Fever  Associated symptoms: cough   Associated symptoms: no chest pain, no congestion, no diarrhea, no headaches and no rash     Past Medical History:  Diagnosis Date  . Anemia    was while doing chemo  . Anxiety   . Asthma   . Chemotherapy-induced neuropathy ([place]) 11/01/2015  . Claustrophobia   . COPD (chronic obstructive pulmonary disease) ([place])   . Depression   . Encounter for antineoplastic chemotherapy 02/09/2016  . Glaucoma   . Headache    prior to menopause  . Heart murmur   . History of echocardiogram    Echo 2/17: EF 40-10%, grade 1 diastolic dysfunction, mild MR, trivial pericardial effusion  . History of nuclear stress test    Myoview 2/17: no ischemia or scar, EF 79%; low risk  . History of radiation therapy 10/30/16-11/06/16  . Hyperlipidemia   .  Hypertension   . Insomnia 05/16/2016  . Non-small cell carcinoma of right lung, stage 2 ([place]) 10/03/2015  . Radiation 02/29/16-04/10/16   50.4 Gy to right central chest  . Renal cell carcinoma ([place])    L nephrectomy  in 2012    Patient Active Problem List   Diagnosis Date Noted  . HCAP (healthcare-associated pneumonia) 01/07/2019  . Allergic rhinitis 01/06/2019  . Chronic respiratory failure with hypoxia ([place]) 12/26/2018  . Age-related osteoporosis with current pathological fracture 11/21/2018  . Renal insufficiency 05/20/2018  . History of compression fracture of spine 12/10/2016  . Hypercalcemia 06/25/2016  . Insomnia 05/16/2016  . Pneumonitis, radiation ([place]) 04/11/2016  . S/P lobectomy of lung 01/18/2016  . Chemotherapy-induced neuropathy ([place]) 11/01/2015  . Non-small cell carcinoma of right lung, stage 2 ([place]) 10/03/2015  . Morbid obesity ([place]) 10/01/2015  . Hyperlipidemia 08/14/2013  . Spondylolisthesis of lumbar region 07/03/2013  . Glaucoma 10/23/2012  . Essential hypertension 12/17/2011  . COPD GOLD II  12/17/2011  . Renal cell cancer ([place]) 12/17/2011  . Sciatica of left side 12/17/2011  . Depression 12/17/2011    Past Surgical History:  Procedure Laterality Date  . BACK SURGERY     cervical 1991  . EYE SURGERY    . IR GENERIC HISTORICAL  01/16/2017   IR RADIOLOGIST EVAL & MGMT 01/16/2017 [facility]  . IR GENERIC HISTORICAL  01/31/2017  IR FLUORO GUIDED NEEDLE PLC ASPIRATION/INJECTION LOC 01/31/2017 Luanne Bras, MD [facility]  . IR GENERIC HISTORICAL  01/31/2017   IR VERTEBROPLASTY CERV/THOR BX INC UNI/BIL INC/INJECT/IMAGING 01/31/2017 Luanne Bras, MD [facility]  . IR GENERIC HISTORICAL  01/31/2017   IR VERTEBROPLASTY EA ADDL (T&LS) BX INC UNI/BIL INC INJECT/IMAGING 01/31/2017 Luanne Bras, MD [facility]  . kidney cancer    . NEPHRECTOMY    . SPINE SURGERY    . TUBAL LIGATION    . VIDEO ASSISTED THORACOSCOPY (VATS)/WEDGE RESECTION Right  01/18/2016   Procedure: VIDEO ASSISTED THORACOSCOPY (VATS)/LUNG RESECTION, THOROCOTOMY, RIGHT UPPER LOBECTOMY, LYMPH NODE DISSECTION, PLACEMENT OF ON Q;  Surgeon: [name], MD;  Location: [facility];  Service: Thoracic;  Laterality: Right;  Marland Kitchen VIDEO BRONCHOSCOPY Bilateral 09/20/2015   Procedure: VIDEO BRONCHOSCOPY WITHOUT FLUORO;  Surgeon: Rigoberto Noel, MD;  Location: [facility];  Service: Cardiopulmonary;  Laterality: Bilateral;  . VIDEO BRONCHOSCOPY N/A 01/18/2016   Procedure: VIDEO BRONCHOSCOPY;  Surgeon: [name], MD;  Location: [facility] OR;  Service: Thoracic;  Laterality: N/A;     OB History   No obstetric history on file.      Home Medications    Prior to Admission medications   Medication Sig Start Date End Date Taking? Authorizing Provider  acetaminophen (TYLENOL) 500 MG tablet Take 500 mg by mouth every 6 (six) hours as needed for mild pain, moderate pain, fever or headache. Reported on 05/24/2016   Yes [provider]  albuterol (PROVENTIL) (2.5 MG/3ML) 0.083% nebulizer solution Take 3 mLs (2.5 mg total) by nebulization every 6 (six) hours as needed for wheezing or shortness of breath. 12/30/18  Yes Rai, Ripudeep K, MD  albuterol (VENTOLIN HFA) 108 (90 Base) MCG/ACT inhaler Inhale 1-2 puffs into the lungs every 6 (six) hours as needed for wheezing or shortness of breath. 12/20/18  Yes Azzie Glatter, FNP  alendronate (FOSAMAX) 70 MG tablet Take 1 tablet (70 mg total) by mouth every 7 (seven) days. Take with a full glass of water on an empty stomach. 11/21/18  Yes Forrest Moron, MD  Ascorbic Acid (VITAMIN C) 1000 MG tablet Take 1,000 mg by mouth daily.   Yes [provider]  aspirin EC 81 MG tablet Take 81 mg by mouth daily. Reported on 04/11/2016   Yes [provider]  atorvastatin (LIPITOR) 40 MG tablet TAKE ONE TABLET BY MOUTH ONCE DAILY. 11/21/18  Yes Forrest Moron, MD  benzonatate (TESSALON) 100 MG capsule Take 1 capsule (100 mg total) by mouth  3 (three) times daily as needed for cough. 12/30/18  Yes Rai, Ripudeep K, MD  calcium carbonate (TUMS EX) 750 MG chewable tablet Chew 1 tablet by mouth as needed for heartburn.   Yes [provider]  Carboxymethylcellulose Sodium (ARTIFICIAL TEARS OP) Place 1 drop into both eyes daily as needed (dry eyes).   Yes [provider]  Cholecalciferol (VITAMIN D3 PO) Take 1,200 mg by mouth daily.   Yes [provider]  fish oil-omega-3 fatty acids 1000 MG capsule Take 1 capsule by mouth daily.    Yes [provider]  fluticasone (FLONASE) 50 MCG/ACT nasal spray Place 1 spray into both nostrils daily.   Yes [provider]  Fluticasone-Salmeterol (ADVAIR) 250-50 MCG/DOSE AEPB Inhale 1 puff into the lungs 2 (two) times daily. 11/21/18  Yes Stallings, Zoe A, MD  guaiFENesin-dextromethorphan (ROBITUSSIN DM) 100-10 MG/5ML syrup Take 5 mLs by mouth every 4 (four) hours as needed for cough (  chest congestion). 12/30/18  Yes Rai, Ripudeep K, MD  metoprolol succinate (TOPROL-XL) 50 MG 24 hr tablet Take 1 tablet (50 mg total) by mouth daily. Take with or immediately following a meal. 12/30/18  Yes Rai, Ripudeep K, MD  OXYGEN Inhale 2 L into the lungs continuous.   Yes [provider]  pantoprazole (PROTONIX) 40 MG tablet Take 1 tablet (40 mg total) by mouth daily at 6 (six) AM. 12/31/18  Yes Rai, Ripudeep K, MD  predniSONE (DELTASONE) 10 MG tablet Prednisone dosing: Take  Prednisone 40mg  (4 tabs) x 3 days, then taper to 30mg  (3 tabs) x 3 days, then 20mg  (2 tabs) x 3days, then 10mg  (1 tab) x 3days, then OFF.  Dispense:  30 tabs, refills: None 12/30/18  Yes Rai, Ripudeep K, MD  sodium chloride (OCEAN) 0.65 % SOLN nasal spray Place 1 spray into both nostrils as needed for congestion.   Yes [provider]  tizanidine (ZANAFLEX) 2 MG capsule Take 1 capsule (2 mg total) by mouth 3 (three) times daily as needed for muscle spasms. 11/21/18  Yes Forrest Moron, MD    traMADol (ULTRAM) 50 MG tablet Take 1 tablet (50 mg total) by mouth every 6 (six) hours as needed (BACK PAIN). 11/21/18  Yes Forrest Moron, MD  traZODone (DESYREL) 50 MG tablet Take 0.5-1 tablets (25-50 mg total) by mouth at bedtime as needed for sleep. 11/21/18  Yes Forrest Moron, MD    Family History Family History  Problem Relation Age of Onset  . Heart disease Sister   . Obesity Brother   . Heart attack Daughter 70       s/p CABG  . Glaucoma Daughter   . Breast cancer Sister   . Birth defects Sister   . Cancer Mother        Bladder Cancer  . Hypertension Mother   . CAD Mother 50  . Cancer Maternal Grandmother     Social History Social History   Tobacco Use  . Smoking status: Former Smoker    Packs/day: 1.00    Years: 50.00    Pack years: 50.00    Types: Cigarettes    Last attempt to quit: 03/16/2011    Years since quitting: 7.8  . Smokeless tobacco: Never Used  . Tobacco comment: last 4-5 years of smoking, smoked 0.5 pack/day   Substance Use Topics  . Alcohol use: No    Alcohol/week: 0.0 standard drinks  . Drug use: No     Allergies   Bee venom; Amlodipine; Levofloxacin; and Hctz [hydrochlorothiazide]   Review of Systems Review of Systems  Constitutional: Positive for fever. Negative for appetite change and fatigue.  HENT: Negative for congestion, ear discharge and sinus pressure.   Eyes: Negative for discharge.  Respiratory: Positive for cough and shortness of breath.   Cardiovascular: Negative for chest pain.  Gastrointestinal: Negative for abdominal pain and diarrhea.  Genitourinary: Negative for frequency and hematuria.  Musculoskeletal: Negative for back pain.  Skin: Negative for rash.  Neurological: Negative for seizures and headaches.  Psychiatric/Behavioral: Negative for hallucinations.     Physical Exam Updated Vital Signs BP 133/88 (BP Location: Right Arm)   Pulse 83   Temp 98.9 F (37.2 C) (Oral)   Resp 15   Ht 5\' 3"  (1.6 m)   Wt  78.3 kg   SpO2 92%   BMI 30.58 kg/m   Physical Exam Constitutional:      Appearance: She is well-developed.  HENT:  Head: Normocephalic.     Nose: Nose normal.  Eyes:     General: No scleral icterus.    Conjunctiva/sclera: Conjunctivae normal.  Neck:     Musculoskeletal: Neck supple.     Thyroid: No thyromegaly.  Cardiovascular:     Rate and Rhythm: Normal rate and regular rhythm.     Heart sounds: No murmur. No friction rub. No gallop.   Pulmonary:     Breath sounds: No stridor. Wheezing present. No rales.  Chest:     Chest wall: No tenderness.  Abdominal:     General: There is no distension.     Tenderness: There is no abdominal tenderness. There is no rebound.  Musculoskeletal: Normal range of motion.  Lymphadenopathy:     Cervical: No cervical adenopathy.  Skin:    Findings: No erythema or rash.  Neurological:     Mental Status: She is alert and oriented to person, place, and time.     Motor: No abnormal muscle tone.     Coordination: Coordination normal.  Psychiatric:        Behavior: Behavior normal.      ED Treatments / Results  Labs (all labs ordered are listed, but only abnormal results are displayed) Labs Reviewed  CBC WITH DIFFERENTIAL/PLATELET - Abnormal; Notable for the following components:      Result Value   WBC 19.2 (*)    RDW 16.8 (*)    Neutro Abs 17.7 (*)    Abs Immature Granulocytes 0.34 (*)    All other components within normal limits  COMPREHENSIVE METABOLIC PANEL - Abnormal; Notable for the following components:   Chloride 95 (*)    CO2 34 (*)    Glucose, Bld 133 (*)    Creatinine, Ser 1.18 (*)    Total Bilirubin 1.3 (*)    GFR calc non Af Amer 45 (*)    GFR calc Af Amer 52 (*)    All other components within normal limits  EXPECTORATED SPUTUM ASSESSMENT W REFEX TO RESP CULTURE  GRAM STAIN  INFLUENZA PANEL BY PCR (TYPE A & B)  HIV ANTIBODY (ROUTINE TESTING W REFLEX)  STREP PNEUMONIAE URINARY ANTIGEN     EKG None  Radiology Dg Chest 2 View  Result Date: 01/07/2019 CLINICAL DATA:  Shortness of breath and productive cough. EXAM: CHEST - 2 VIEW COMPARISON:  12/29/2018 FINDINGS: The cardiac silhouette, mediastinal and hilar contours are stable. Patchy left lower lobe airspace opacity consistent with pneumonia. No pleural effusion. The right lung is clear. IMPRESSION: Left lower lobe infiltrate. Electronically Signed   By: Marijo Sanes M.D.   On: 01/07/2019 13:41    Procedures Procedures (including critical care time)  Medications Ordered in ED Medications  vancomycin (VANCOCIN) IVPB 1000 mg/200 mL premix (1,000 mg Intravenous New Bag/Given 01/07/19 1502)  piperacillin-tazobactam (ZOSYN) IVPB 3.375 g (3.375 g Intravenous New Bag/Given 01/07/19 1459)  aspirin EC tablet 81 mg (has no administration in time range)  atorvastatin (LIPITOR) tablet 40 mg (has no administration in time range)  benzonatate (TESSALON) capsule 100 mg (has no administration in time range)  fluticasone (FLONASE) 50 MCG/ACT nasal spray 2 spray (has no administration in time range)  mometasone-formoterol (DULERA) 200-5 MCG/ACT inhaler 2 puff (has no administration in time range)  metoprolol succinate (TOPROL-XL) 24 hr tablet 50 mg (has no administration in time range)  pantoprazole (PROTONIX) EC tablet 40 mg (has no administration in time range)  sodium chloride (OCEAN) 0.65 % nasal spray 1 spray (has no administration in  time range)  tizanidine (ZANAFLEX) capsule 2 mg (has no administration in time range)  traMADol (ULTRAM) tablet 50 mg (has no administration in time range)  traZODone (DESYREL) tablet 25-50 mg (has no administration in time range)  enoxaparin (LOVENOX) injection 40 mg (has no administration in time range)  lactated ringers infusion (has no administration in time range)  ceFEPIme (MAXIPIME) 1 g in sodium chloride 0.9 % 100 mL IVPB (has no administration in time range)  methylPREDNISolone sodium  succinate (SOLU-MEDROL) 40 mg/mL injection 40 mg (has no administration in time range)  ipratropium-albuterol (DUONEB) 0.5-2.5 (3) MG/3ML nebulizer solution 3 mL (has no administration in time range)  dextromethorphan-guaiFENesin (MUCINEX DM) 30-600 MG per 12 hr tablet 1 tablet (has no administration in time range)  ipratropium-albuterol (DUONEB) 0.5-2.5 (3) MG/3ML nebulizer solution 3 mL (3 mLs Nebulization Given 01/07/19 1209)  albuterol (PROVENTIL) (2.5 MG/3ML) 0.083% nebulizer solution 2.5 mg (2.5 mg Nebulization Given 01/07/19 1209)  magnesium sulfate IVPB 2 g 50 mL (0 g Intravenous Stopped 01/07/19 1357)  methylPREDNISolone sodium succinate (SOLU-MEDROL) 125 mg/2 mL injection 125 mg (125 mg Intravenous Given 01/07/19 1249)     Initial Impression / Assessment and Plan / ED Course  I have reviewed the triage vital signs and the nursing notes.  Pertinent labs & imaging results that were available during my care of the patient were reviewed by me and considered in my medical decision making (see chart for details).       Patient with pneumonia.  She will be admitted to medicine  Final Clinical Impressions(s) / ED Diagnoses   Final diagnoses:  HCAP (healthcare-associated pneumonia)    ED Discharge Orders    None       Milton Ferguson, MD 01/07/19 1525

## 2019-01-07 NOTE — ED Notes (Signed)
ED TO INPATIENT HANDOFF REPORT  Name/Age/Gender Tammy Ball 78 y.o. female  Code Status    Code Status Orders  (From admission, onward)         Start     Ordered   01/07/19 1520  Full code  Continuous     01/07/19 1521        Code Status History    Date Active Date Inactive Code Status Order ID Comments User Context   12/25/2018 1558 12/30/2018 1438 Full Code [id number]  Elodia [place]., MD Inpatient   01/23/2018 1818 01/25/2018 1724 Full Code [id number]  Roxan Hockey, MD ED   09/19/2015 1447 09/21/2015 1331 Full Code [id number]  Domenic Polite, MD Inpatient   09/08/2015 1419 09/13/2015 1747 Full Code [id number]  Hosie Poisson, MD Inpatient    Advance Directive Documentation     Most Recent Value  Type of Advance Directive  Healthcare Power of Attorney, Living will  Pre-existing out of facility DNR order (yellow form or pink MOST form)  -  "MOST" Form in Place?  -      Home/SNF/Other Home  Chief Complaint shob;uri symptoms  Level of Care/Admitting Diagnosis ED Disposition    ED Disposition Condition [place]: [facility] [[id number]]  Level of Care: Med-Surg [16]  Diagnosis: HCAP (healthcare-associated pneumonia) [707867]  Admitting Physician: Lavina Hamman [[id number]]  Attending Physician: Lavina Hamman [[id number]]  Estimated length of stay: past midnight tomorrow  Certification:: I certify this patient will need inpatient services for at least 2 midnights  PT Class (Do Not Modify): Inpatient [101]  PT Acc Code (Do Not Modify): Private [1]       Medical History Past Medical History:  Diagnosis Date  . Anemia    was while doing chemo  . Anxiety   . Asthma   . Chemotherapy-induced neuropathy ([place]) 11/01/2015  . Claustrophobia   . COPD (chronic obstructive pulmonary disease) ([place])   . Depression   . Encounter for antineoplastic chemotherapy 02/09/2016  . Glaucoma   . Headache    prior to menopause  . Heart  murmur   . History of echocardiogram    Echo 2/17: EF 75-88%, grade 1 diastolic dysfunction, mild MR, trivial pericardial effusion  . History of nuclear stress test    Myoview 2/17: no ischemia or scar, EF 79%; low risk  . History of radiation therapy 10/30/16-11/06/16  . Hyperlipidemia   . Hypertension   . Insomnia 05/16/2016  . Non-small cell carcinoma of right lung, stage 2 ([place]) 10/03/2015  . Radiation 02/29/16-04/10/16   50.4 Gy to right central chest  . Renal cell carcinoma ([facility])    L nephrectomy  in 2012    Allergies Allergies  Allergen Reactions  . Bee Venom Anaphylaxis, Shortness Of Breath, Swelling and Other (See Comments)    Swelling at site   . Amlodipine Swelling and Other (See Comments)    Swelling of the ankles and hands   . Levofloxacin Other (See Comments)    Joint pain   . Hctz [Hydrochlorothiazide] Palpitations and Other (See Comments)    Sweating     IV Location/Drains/Wounds Patient Lines/Drains/Airways Status   Active Line/Drains/Airways    Name:   Placement date:   Placement time:   Site:   Days:   Peripheral IV 01/07/19 Left Antecubital   01/07/19    1244    Antecubital   less than 1  Labs/Imaging Results for orders placed or performed during the hospital encounter of 01/07/19 (from the past 48 hour(s))  Influenza panel by PCR (type A & B)     Status: None   Collection Time: 01/07/19 12:58 PM  Result Value Ref Range   Influenza A By PCR NEGATIVE NEGATIVE   Influenza B By PCR NEGATIVE NEGATIVE    Comment: (NOTE) The Xpert Xpress Flu assay is intended as an aid in the diagnosis of  influenza and should not be used as a sole basis for treatment.  This  assay is FDA approved for nasopharyngeal swab specimens only. Nasal  washings and aspirates are unacceptable for Xpert Xpress Flu testing. Performed at [facility], [place] [address] [place], [place] [address]   CBC with Differential/Platelet     Status: Abnormal    Collection Time: 01/07/19 12:59 PM  Result Value Ref Range   WBC 19.2 (H) 4.0 - 10.5 K/uL   RBC 3.98 3.87 - 5.11 MIL/uL   Hemoglobin 12.5 12.0 - 15.0 g/dL   HCT 39.0 36.0 - 46.0 %   MCV 98.0 80.0 - 100.0 fL   MCH 31.4 26.0 - 34.0 pg   MCHC 32.1 30.0 - 36.0 g/dL   RDW 16.8 (H) 11.5 - 15.5 %   Platelets 289 150 - 400 K/uL   nRBC 0.0 0.0 - 0.2 %   Neutrophils Relative % 92 %   Neutro Abs 17.7 (H) 1.7 - 7.7 K/uL   Lymphocytes Relative 4 %   Lymphs Abs 0.7 0.7 - 4.0 K/uL   Monocytes Relative 2 %   Monocytes Absolute 0.5 0.1 - 1.0 K/uL   Eosinophils Relative 0 %   Eosinophils Absolute 0.0 0.0 - 0.5 K/uL   Basophils Relative 0 %   Basophils Absolute 0.0 0.0 - 0.1 K/uL   Immature Granulocytes 2 %   Abs Immature Granulocytes 0.34 (H) 0.00 - 0.07 K/uL    Comment: Performed at [facility], [place] [address] [place], [place] [address]  Comprehensive metabolic panel     Status: Abnormal   Collection Time: 01/07/19 12:59 PM  Result Value Ref Range   Sodium 137 135 - 145 mmol/L   Potassium 4.5 3.5 - 5.1 mmol/L   Chloride 95 (L) 98 - 111 mmol/L   CO2 34 (H) 22 - 32 mmol/L   Glucose, Bld 133 (H) 70 - 99 mg/dL   BUN 20 8 - 23 mg/dL   Creatinine, Ser 1.18 (H) 0.44 - 1.00 mg/dL   Calcium 9.4 8.9 - 10.3 mg/dL   Total Protein 7.3 6.5 - 8.1 g/dL   Albumin 3.9 3.5 - 5.0 g/dL   AST 19 15 - 41 U/L   ALT 20 0 - 44 U/L   Alkaline Phosphatase 65 38 - 126 U/L   Total Bilirubin 1.3 (H) 0.3 - 1.2 mg/dL   GFR calc non Af Amer 45 (L) >60 mL/min   GFR calc Af Amer 52 (L) >60 mL/min   Anion gap 8 5 - 15    Comment: Performed at [facility], [place] [address] [place], [place] [address]   Dg Sinuses Complete  Result Date: 01/07/2019 CLINICAL DATA:  Recently discharged from hospital with pneumonia. History of lung cancer. Patient describes nasal congestion and postnasal drip. EXAM: PARANASAL SINUSES - COMPLETE 3 + VIEW COMPARISON:  None. FINDINGS: The paranasal sinus are  aerated. There is no evidence of sinus opacification air-fluid levels or mucosal thickening. No significant bone abnormalities  are seen. IMPRESSION: Negative. Electronically Signed   By: Franki Cabot M.D.   On: 01/07/2019 17:12   Dg Chest 2 View  Result Date: 01/07/2019 CLINICAL DATA:  Shortness of breath and productive cough. EXAM: CHEST - 2 VIEW COMPARISON:  12/29/2018 FINDINGS: The cardiac silhouette, mediastinal and hilar contours are stable. Patchy left lower lobe airspace opacity consistent with pneumonia. No pleural effusion. The right lung is clear. IMPRESSION: Left lower lobe infiltrate. Electronically Signed   By: Marijo Sanes M.D.   On: 01/07/2019 13:41   None  Pending Labs Unresulted Labs (From admission, onward)    Start     Ordered   01/07/19 1519  Culture, sputum-assessment  Once,   R     01/07/19 1521   01/07/19 1519  Gram stain  Once,   R     01/07/19 1521   01/07/19 1519  HIV antibody (Routine Screening)  Once,   R     01/07/19 1521   01/07/19 1519  Strep pneumoniae urinary antigen  Once,   R     01/07/19 1521          Vitals/Pain Today's Vitals   01/07/19 1457 01/07/19 1512 01/07/19 1600 01/07/19 1713  BP:  133/88 114/61 112/67  Pulse:  83 83 81  Resp:  15 (!) 23 16  Temp:    98.1 F (36.7 C)  TempSrc:    Oral  SpO2:  92% 92% 95%  Weight:      Height:      PainSc: 0-No pain   0-No pain    Isolation Precautions No active isolations  Medications Medications  aspirin EC tablet 81 mg (has no administration in time range)  atorvastatin (LIPITOR) tablet 40 mg (has no administration in time range)  benzonatate (TESSALON) capsule 100 mg (has no administration in time range)  fluticasone (FLONASE) 50 MCG/ACT nasal spray 2 spray (has no administration in time range)  mometasone-formoterol (DULERA) 200-5 MCG/ACT inhaler 2 puff (has no administration in time range)  metoprolol succinate (TOPROL-XL) 24 hr tablet 50 mg (has no administration in time range)   pantoprazole (PROTONIX) EC tablet 40 mg (has no administration in time range)  sodium chloride (OCEAN) 0.65 % nasal spray 1 spray (has no administration in time range)  tizanidine (ZANAFLEX) capsule 2 mg (has no administration in time range)  traMADol (ULTRAM) tablet 50 mg (has no administration in time range)  traZODone (DESYREL) tablet 25-50 mg (has no administration in time range)  enoxaparin (LOVENOX) injection 40 mg (has no administration in time range)  lactated ringers infusion (has no administration in time range)  ceFEPIme (MAXIPIME) 1 g in sodium chloride 0.9 % 100 mL IVPB (has no administration in time range)  methylPREDNISolone sodium succinate (SOLU-MEDROL) 40 mg/mL injection 40 mg (has no administration in time range)  ipratropium-albuterol (DUONEB) 0.5-2.5 (3) MG/3ML nebulizer solution 3 mL (3 mLs Nebulization Not Given 01/07/19 1608)  dextromethorphan-guaiFENesin (MUCINEX DM) 30-600 MG per 12 hr tablet 1 tablet (has no administration in time range)  vancomycin (VANCOCIN) IVPB 1000 mg/200 mL premix (has no administration in time range)  ipratropium-albuterol (DUONEB) 0.5-2.5 (3) MG/3ML nebulizer solution 3 mL (3 mLs Nebulization Given 01/07/19 1209)  albuterol (PROVENTIL) (2.5 MG/3ML) 0.083% nebulizer solution 2.5 mg (2.5 mg Nebulization Given 01/07/19 1209)  magnesium sulfate IVPB 2 g 50 mL (0 g Intravenous Stopped 01/07/19 1357)  methylPREDNISolone sodium succinate (SOLU-MEDROL) 125 mg/2 mL injection 125 mg (125 mg Intravenous Given 01/07/19 1249)  vancomycin (VANCOCIN) IVPB 1000  mg/200 mL premix (0 mg Intravenous Stopped 01/07/19 1602)  piperacillin-tazobactam (ZOSYN) IVPB 3.375 g (0 g Intravenous Stopped 01/07/19 1602)    Mobility walks

## 2019-01-07 NOTE — Telephone Encounter (Signed)
Pt c/o fever to 100.0, difficulty breathing, and coughing thick yellow phlegm. Pt stated she did not sleep last night due to difficulty with breathing. Pt was just in hospital with COPD exacerbation, and pneumonia. Pt is on oxygen at 2 LPM. Pt is currently on steroids. Pt with h/o lung cancer and kidney cancer.  Pt advised to go to ED for evaluation. Pt given care advice and verbalized understanding. Pt to go to [facility] ED.   Reason for Disposition . Difficulty breathing  Answer Assessment - Initial Assessment Questions 1. ONSET: "When did the cough begin?"      Sunday night  2. SEVERITY: "How bad is the cough today?"      Did not sleep last night from coughing 3. RESPIRATORY DISTRESS: "Describe your breathing."      Difficulty breathing O2  2 LPM- concentrated O2 4. FEVER: "Do you have a fever?" If so, ask: "What is your temperature, how was it measured, and when did it start?"     10 0.0 5. SPUTUM: "Describe the color of your sputum" (clear, white, yellow, green)     Thick yellow  6. HEMOPTYSIS: "Are you coughing up any blood?" If so ask: "How much?" (flecks, streaks, tablespoons, etc.)     no 7. CARDIAC HISTORY: "Do you have any history of heart disease?" (e.g., heart attack, congestive heart failure)      no 8. LUNG HISTORY: "Do you have any history of lung disease?"  (e.g., pulmonary embolus, asthma, emphysema)     COPD, lung cancer and kidney cancer, h/o pna 9. PE RISK FACTORS: "Do you have a history of blood clots?" (or: recent major surgery, recent prolonged travel, bedridden)     no 10. OTHER SYMPTOMS: "Do you have any other symptoms?" (e.g., runny nose, wheezing, chest pain)       Wheezing and runny nose 11. PREGNANCY: "Is there any chance you are pregnant?" "When was your last menstrual period?"       n/a 12. TRAVEL: "Have you traveled out of the country in the last month?" (e.g., travel history, exposures)       no  Protocols used: [place]

## 2019-01-07 NOTE — H&P (Signed)
[facility] History and Physical   Patient: Tammy Ball WCB:[id number]   PCP: Forrest Moron, MD DOB: [birth year]   DOA: 01/07/2019   DOS: 01/07/2019   DOS: the patient was seen and examined on 01/07/2019  Patient coming from: The patient is coming from home.  Chief Complaint: Cough and shortness of breath  HPI: Tammy Ball is a 78 y.o. female with Past medical history of COPD, right lung cancer with left metastasis, renal cell cancer, HTN, HLD, recent admission for pneumonia. Patient presents to the hospital with complaints of cough. Patient was recently hospitalized between 12/25/2018-12/30/2018.  Seen by CCM.  Treated with Ceftin and prednisone on discharge and recently seen PCP yesterday prior to admission. Patient reports that after her discharge from 01/04/2019 she started having increasing cough and shortness of breath. She also saw a change in her sputum culture from clear to dark yellow. She denies any blood in the stool. She started having fever yesterday. No nausea no vomiting no choking episode reported. She reported the symptoms to her recent visit at [facility] office and was recommended to change her Ruthe Mannan to trilogy. Denies any other acute complaint right now.  No chest pain.  ED Course: Chest x-ray was suggestive of a new infiltrate in the left lower lobe and therefore patient was referred for admission.  At her baseline ambulates without any support, uses 2 L of oxygen. And is independent for most of her ADL; manages her medication on her own.  Review of Systems: as mentioned in the history of present illness.  All other systems reviewed and are negative.  Past Medical History:  Diagnosis Date  . Anemia    was while doing chemo  . Anxiety   . Asthma   . Chemotherapy-induced neuropathy ([place]) 11/01/2015  . Claustrophobia   . COPD (chronic obstructive pulmonary disease) ([place])   . Depression   . Encounter for antineoplastic chemotherapy 02/09/2016  . Glaucoma    . Headache    prior to menopause  . Heart murmur   . History of echocardiogram    Echo 2/17: EF 61-60%, grade 1 diastolic dysfunction, mild MR, trivial pericardial effusion  . History of nuclear stress test    Myoview 2/17: no ischemia or scar, EF 79%; low risk  . History of radiation therapy 10/30/16-11/06/16  . Hyperlipidemia   . Hypertension   . Insomnia 05/16/2016  . Non-small cell carcinoma of right lung, stage 2 ([place]) 10/03/2015  . Radiation 02/29/16-04/10/16   50.4 Gy to right central chest  . Renal cell carcinoma ([place])    L nephrectomy  in 2012   Past Surgical History:  Procedure Laterality Date  . BACK SURGERY     cervical 1991  . EYE SURGERY    . IR GENERIC HISTORICAL  01/16/2017   IR RADIOLOGIST EVAL & MGMT 01/16/2017 [facility]  . IR GENERIC HISTORICAL  01/31/2017   IR FLUORO GUIDED NEEDLE PLC ASPIRATION/INJECTION LOC 01/31/2017 Luanne Bras, MD [facility]  . IR GENERIC HISTORICAL  01/31/2017   IR VERTEBROPLASTY CERV/THOR BX INC UNI/BIL INC/INJECT/IMAGING 01/31/2017 Luanne Bras, MD [facility]  . IR GENERIC HISTORICAL  01/31/2017   IR VERTEBROPLASTY EA ADDL (T&LS) BX INC UNI/BIL INC INJECT/IMAGING 01/31/2017 Luanne Bras, MD [facility]  . kidney cancer    . NEPHRECTOMY    . SPINE SURGERY    . TUBAL LIGATION    . VIDEO ASSISTED THORACOSCOPY (VATS)/WEDGE RESECTION Right 01/18/2016   Procedure: VIDEO ASSISTED THORACOSCOPY (VATS)/LUNG RESECTION, THOROCOTOMY,  RIGHT UPPER LOBECTOMY, LYMPH NODE DISSECTION, PLACEMENT OF ON Q;  Surgeon: [name], MD;  Location: [facility];  Service: Thoracic;  Laterality: Right;  Marland Kitchen VIDEO BRONCHOSCOPY Bilateral 09/20/2015   Procedure: VIDEO BRONCHOSCOPY WITHOUT FLUORO;  Surgeon: Rigoberto Noel, MD;  Location: [facility];  Service: Cardiopulmonary;  Laterality: Bilateral;  . VIDEO BRONCHOSCOPY N/A 01/18/2016   Procedure: VIDEO BRONCHOSCOPY;  Surgeon: [name], MD;  Location: [place] [facility];  Service: Thoracic;  Laterality: N/A;    Social History:  reports that she quit smoking about 7 years ago. Her smoking use included cigarettes. She has a 50.00 pack-year smoking history. She has never used smokeless tobacco. She reports that she does not drink alcohol or use drugs.  Allergies  Allergen Reactions  . Bee Venom Anaphylaxis, Shortness Of Breath, Swelling and Other (See Comments)    Swelling at site   . Amlodipine Swelling and Other (See Comments)    Swelling of the ankles and hands   . Levofloxacin Other (See Comments)    Joint pain   . Hctz [Hydrochlorothiazide] Palpitations and Other (See Comments)    Sweating    Family History  Problem Relation Age of Onset  . Heart disease Sister   . Obesity Brother   . Heart attack Daughter 74       s/p CABG  . Glaucoma Daughter   . Breast cancer Sister   . Birth defects Sister   . Cancer Mother        Bladder Cancer  . Hypertension Mother   . CAD Mother 54  . Cancer Maternal Grandmother      Prior to Admission medications   Medication Sig Start Date End Date Taking? Authorizing Provider  acetaminophen (TYLENOL) 500 MG tablet Take 500 mg by mouth every 6 (six) hours as needed for mild pain, moderate pain, fever or headache. Reported on 05/24/2016   Yes [provider]  albuterol (PROVENTIL) (2.5 MG/3ML) 0.083% nebulizer solution Take 3 mLs (2.5 mg total) by nebulization every 6 (six) hours as needed for wheezing or shortness of breath. 12/30/18  Yes Rai, Ripudeep K, MD  albuterol (VENTOLIN HFA) 108 (90 Base) MCG/ACT inhaler Inhale 1-2 puffs into the lungs every 6 (six) hours as needed for wheezing or shortness of breath. 12/20/18  Yes Azzie Glatter, FNP  alendronate (FOSAMAX) 70 MG tablet Take 1 tablet (70 mg total) by mouth every 7 (seven) days. Take with a full glass of water on an empty stomach. 11/21/18  Yes Forrest Moron, MD  Ascorbic Acid (VITAMIN C) 1000 MG tablet Take 1,000 mg by mouth daily.   Yes [provider]  aspirin EC 81 MG  tablet Take 81 mg by mouth daily. Reported on 04/11/2016   Yes [provider]  atorvastatin (LIPITOR) 40 MG tablet TAKE ONE TABLET BY MOUTH ONCE DAILY. 11/21/18  Yes Forrest Moron, MD  benzonatate (TESSALON) 100 MG capsule Take 1 capsule (100 mg total) by mouth 3 (three) times daily as needed for cough. 12/30/18  Yes Rai, Ripudeep K, MD  calcium carbonate (TUMS EX) 750 MG chewable tablet Chew 1 tablet by mouth as needed for heartburn.   Yes [provider]  Carboxymethylcellulose Sodium (ARTIFICIAL TEARS OP) Place 1 drop into both eyes daily as needed (dry eyes).   Yes [provider]  Cholecalciferol (VITAMIN D3 PO) Take 1,200 mg by mouth daily.   Yes [provider]  fish oil-omega-3 fatty acids 1000 MG capsule Take 1 capsule  by mouth daily.    Yes [provider]  fluticasone (FLONASE) 50 MCG/ACT nasal spray Place 1 spray into both nostrils daily.   Yes [provider]  Fluticasone-Salmeterol (ADVAIR) 250-50 MCG/DOSE AEPB Inhale 1 puff into the lungs 2 (two) times daily. 11/21/18  Yes Stallings, Zoe A, MD  guaiFENesin-dextromethorphan (ROBITUSSIN DM) 100-10 MG/5ML syrup Take 5 mLs by mouth every 4 (four) hours as needed for cough (chest congestion). 12/30/18  Yes Rai, Ripudeep K, MD  metoprolol succinate (TOPROL-XL) 50 MG 24 hr tablet Take 1 tablet (50 mg total) by mouth daily. Take with or immediately following a meal. 12/30/18  Yes Rai, Ripudeep K, MD  OXYGEN Inhale 2 L into the lungs continuous.   Yes [provider]  pantoprazole (PROTONIX) 40 MG tablet Take 1 tablet (40 mg total) by mouth daily at 6 (six) AM. 12/31/18  Yes Rai, Ripudeep K, MD  predniSONE (DELTASONE) 10 MG tablet Prednisone dosing: Take  Prednisone 40mg  (4 tabs) x 3 days, then taper to 30mg  (3 tabs) x 3 days, then 20mg  (2 tabs) x 3days, then 10mg  (1 tab) x 3days, then OFF.  Dispense:  30 tabs, refills: None 12/30/18  Yes Rai, Ripudeep K, MD  sodium chloride (OCEAN)  0.65 % SOLN nasal spray Place 1 spray into both nostrils as needed for congestion.   Yes [provider]  tizanidine (ZANAFLEX) 2 MG capsule Take 1 capsule (2 mg total) by mouth 3 (three) times daily as needed for muscle spasms. 11/21/18  Yes Forrest Moron, MD  traMADol (ULTRAM) 50 MG tablet Take 1 tablet (50 mg total) by mouth every 6 (six) hours as needed (BACK PAIN). 11/21/18  Yes Forrest Moron, MD  traZODone (DESYREL) 50 MG tablet Take 0.5-1 tablets (25-50 mg total) by mouth at bedtime as needed for sleep. 11/21/18  Yes Forrest Moron, MD    Physical Exam: Vitals:   01/07/19 1512 01/07/19 1600 01/07/19 1713 01/07/19 1851  BP: 133/88 114/61 112/67   Pulse: 83 83 81   Resp: 15 (!) 23 16   Temp:   98.1 F (36.7 C)   TempSrc:   Oral   SpO2: 92% 92% 95%   Weight:    78 kg  Height:    5\' 3"  (1.6 m)    General: Alert, Awake and Oriented to Time, Place and Person. Appear in mild distress, affect appropriate Eyes: PERRL, Conjunctiva normal ENT: Oral Mucosa clear moist. Neck: no JVD, no Abnormal Mass Or lumps Cardiovascular: S1 and S2 Present, no Murmur, Peripheral Pulses Present Respiratory: increased respiratory effort, Bilateral Air entry equal and Decreased, no use of accessory muscle, bilateral fiant basal Crackles, no wheezes Abdomen: Bowel Sound present, Soft and no tenderness, no hernia Skin: no redness, no Rash, no induration Extremities: no Pedal edema, no calf tenderness Neurologic: Grossly no focal neuro deficit. Bilaterally Equal motor strength  Labs on Admission:  CBC: Recent Labs  Lab 01/07/19 1259  WBC 19.2*  NEUTROABS 17.7*  HGB 12.5  HCT 39.0  MCV 98.0  PLT 384   Basic Metabolic Panel: Recent Labs  Lab 01/07/19 1259  NA 137  K 4.5  CL 95*  CO2 34*  GLUCOSE 133*  BUN 20  CREATININE 1.18*  CALCIUM 9.4   GFR: Estimated Creatinine Clearance: 40.7 mL/min (A) (by C-G formula based on SCr of 1.18 mg/dL (H)). Liver Function Tests: Recent  Labs  Lab 01/07/19 1259  AST 19  ALT 20  ALKPHOS 65  BILITOT 1.3*  PROT 7.3  ALBUMIN 3.9   No results for input(s): LIPASE, AMYLASE in the last 168 hours. No results for input(s): AMMONIA in the last 168 hours. Coagulation Profile: No results for input(s): INR, PROTIME in the last 168 hours. Cardiac Enzymes: No results for input(s): CKTOTAL, CKMB, CKMBINDEX, TROPONINI in the last 168 hours. BNP (last 3 results) No results for input(s): PROBNP in the last 8760 hours. HbA1C: No results for input(s): HGBA1C in the last 72 hours. CBG: No results for input(s): GLUCAP in the last 168 hours. Lipid Profile: No results for input(s): CHOL, HDL, LDLCALC, TRIG, CHOLHDL, LDLDIRECT in the last 72 hours. Thyroid Function Tests: No results for input(s): TSH, T4TOTAL, FREET4, T3FREE, THYROIDAB in the last 72 hours. Anemia Panel: No results for input(s): VITAMINB12, FOLATE, FERRITIN, TIBC, IRON, RETICCTPCT in the last 72 hours. Urine analysis:    Component Value Date/Time   COLORURINE YELLOW 12/25/2018 1320   APPEARANCEUR CLEAR 12/25/2018 1320   LABSPEC 1.016 12/25/2018 1320   LABSPEC 1.010 10/18/2015 0959   PHURINE 5.0 12/25/2018 1320   GLUCOSEU NEGATIVE 12/25/2018 1320   GLUCOSEU Negative 10/18/2015 0959   HGBUR NEGATIVE 12/25/2018 1320   BILIRUBINUR NEGATIVE 12/25/2018 1320   BILIRUBINUR negative 02/19/2018 0904   BILIRUBINUR Negative 10/18/2015 0959   KETONESUR NEGATIVE 12/25/2018 1320   PROTEINUR NEGATIVE 12/25/2018 1320   UROBILINOGEN 0.2 02/19/2018 0904   UROBILINOGEN 0.2 10/18/2015 0959   NITRITE NEGATIVE 12/25/2018 1320   LEUKOCYTESUR MODERATE (A) 12/25/2018 1320   LEUKOCYTESUR Negative 10/18/2015 0959    Radiological Exams on Admission: Dg Sinuses Complete  Result Date: 01/07/2019 CLINICAL DATA:  Recently discharged from hospital with pneumonia. History of lung cancer. Patient describes nasal congestion and postnasal drip. EXAM: PARANASAL SINUSES - COMPLETE 3 + VIEW  COMPARISON:  None. FINDINGS: The paranasal sinus are aerated. There is no evidence of sinus opacification air-fluid levels or mucosal thickening. No significant bone abnormalities are seen. IMPRESSION: Negative. Electronically Signed   By: Franki Cabot M.D.   On: 01/07/2019 17:12   Dg Chest 2 View  Result Date: 01/07/2019 CLINICAL DATA:  Shortness of breath and productive cough. EXAM: CHEST - 2 VIEW COMPARISON:  12/29/2018 FINDINGS: The cardiac silhouette, mediastinal and hilar contours are stable. Patchy left lower lobe airspace opacity consistent with pneumonia. No pleural effusion. The right lung is clear. IMPRESSION: Left lower lobe infiltrate. Electronically Signed   By: Marijo Sanes M.D.   On: 01/07/2019 13:41   Assessment/Plan 1. HCAP (healthcare-associated pneumonia) Presents with worsening cough and shortness of breath. Chest x-ray shows a new infiltrate in the left lower which was not present during his last admission. Unclear whether this represents a new pneumonia but will continue with broad-spectrum antibiotics. Follow-up on cultures. Incentive spirometry.  2.  History of non-small cell lung cancer on the right S/P lobectomy. History of left lung metastasis S/P stereotactic radiation. Patient does have history of left lung metastasis where she has received stereotactic radiation in the past. The same area does show persistent patchy opacities in the parenchyma since then. This is also present on recent CT scan of the chest done in January 2020. It is not clear whether this area is causing the patient to have recurrent pneumonia due to probable obstruction from radiation pneumonitis or whether this represents local recurrence. May require further work-up once she is clinically better from pneumonia. We will continue with IV antibiotics for now.  3.  History of COPD. Chronic respiratory failure. Continue with steroids, Tessalon Perles, home inhalers.  PRN  nebulizer. Monitor.  4.  Essential hypertension. Patient is on Toprol-XL at home. May require to change her regimen to some other agent given her history of COPD. For now I am continuing the same.  5.  GERD. Continue PPI.  Nutrition: Regular diet DVT Prophylaxis: subcutaneous Heparin  Advance goals of care discussion: full code   Consults: none  Family Communication: family was present at bedside, at the time of interview.  Opportunity was given to ask question and all questions were answered satisfactorily.  Disposition: Admitted asinpatient, med-surge unit. Likely to be discharged hoe, in 2-3 days.  Author: Berle Mull, MD [facility] 01/07/2019  To reach On-call, see care teams to locate the attending and reach out to them via [email or web address]. If 7PM-7AM, please contact night-coverage If you still have difficulty reaching the attending provider, please page the [facility] (Director on Call) for [facility] on amion for assistance.

## 2019-01-07 NOTE — ED Triage Notes (Addendum)
Patient reports that she recently was discharged from the hospital with pneumonia and has a history of lung cancer. Patient states she was discharged with oxygen and antibiotics which she finished 3 days ago and is currently taking steroids. Patient states she has had increased SOB and continues to have a productive cough with thick yellow sputum. Patient also c/o nasal congestion. Patient states she has not had a fever since being discharged on 12/30/18. Patient called her physician and was told to come to the ED.

## 2019-01-07 NOTE — ED Notes (Signed)
Patient transported to X-ray 

## 2019-01-07 NOTE — ED Notes (Signed)
Family at bedside. 

## 2019-01-08 DIAGNOSIS — J449 Chronic obstructive pulmonary disease, unspecified: Secondary | ICD-10-CM

## 2019-01-08 DIAGNOSIS — I10 Essential (primary) hypertension: Secondary | ICD-10-CM

## 2019-01-08 DIAGNOSIS — E782 Mixed hyperlipidemia: Secondary | ICD-10-CM

## 2019-01-08 LAB — CBC WITH DIFFERENTIAL/PLATELET
Abs Immature Granulocytes: 0.09 10*3/uL — ABNORMAL HIGH (ref 0.00–0.07)
Basophils Absolute: 0 10*3/uL (ref 0.0–0.1)
Basophils Relative: 0 %
Eosinophils Absolute: 0 10*3/uL (ref 0.0–0.5)
Eosinophils Relative: 0 %
HCT: 34.7 % — ABNORMAL LOW (ref 36.0–46.0)
Hemoglobin: 11 g/dL — ABNORMAL LOW (ref 12.0–15.0)
Immature Granulocytes: 1 %
Lymphocytes Relative: 4 %
Lymphs Abs: 0.5 10*3/uL — ABNORMAL LOW (ref 0.7–4.0)
MCH: 31.2 pg (ref 26.0–34.0)
MCHC: 31.7 g/dL (ref 30.0–36.0)
MCV: 98.3 fL (ref 80.0–100.0)
Monocytes Absolute: 0.1 10*3/uL (ref 0.1–1.0)
Monocytes Relative: 1 %
Neutro Abs: 10.5 10*3/uL — ABNORMAL HIGH (ref 1.7–7.7)
Neutrophils Relative %: 94 %
Platelets: 265 10*3/uL (ref 150–400)
RBC: 3.53 MIL/uL — ABNORMAL LOW (ref 3.87–5.11)
RDW: 16.8 % — ABNORMAL HIGH (ref 11.5–15.5)
WBC: 11.2 10*3/uL — ABNORMAL HIGH (ref 4.0–10.5)
nRBC: 0 % (ref 0.0–0.2)

## 2019-01-08 LAB — MRSA PCR SCREENING: MRSA by PCR: NEGATIVE

## 2019-01-08 LAB — COMPREHENSIVE METABOLIC PANEL
ALT: 20 U/L (ref 0–44)
AST: 15 U/L (ref 15–41)
Albumin: 3.1 g/dL — ABNORMAL LOW (ref 3.5–5.0)
Alkaline Phosphatase: 52 U/L (ref 38–126)
Anion gap: 7 (ref 5–15)
BUN: 25 mg/dL — ABNORMAL HIGH (ref 8–23)
CO2: 30 mmol/L (ref 22–32)
Calcium: 8.6 mg/dL — ABNORMAL LOW (ref 8.9–10.3)
Chloride: 102 mmol/L (ref 98–111)
Creatinine, Ser: 1.13 mg/dL — ABNORMAL HIGH (ref 0.44–1.00)
GFR calc Af Amer: 55 mL/min — ABNORMAL LOW (ref >60)
GFR calc non Af Amer: 48 mL/min — ABNORMAL LOW (ref >60)
Glucose, Bld: 144 mg/dL — ABNORMAL HIGH (ref 70–99)
Potassium: 4.2 mmol/L (ref 3.5–5.1)
Sodium: 139 mmol/L (ref 135–145)
Total Bilirubin: 0.9 mg/dL (ref 0.3–1.2)
Total Protein: 6.3 g/dL — ABNORMAL LOW (ref 6.5–8.1)

## 2019-01-08 LAB — HIV ANTIBODY (ROUTINE TESTING W REFLEX): HIV Screen 4th Generation wRfx: NONREACTIVE

## 2019-01-08 LAB — STREP PNEUMONIAE URINARY ANTIGEN: Strep Pneumo Urinary Antigen: POSITIVE — AB

## 2019-01-08 MED ORDER — METHYLPREDNISOLONE SODIUM SUCC 40 MG IJ SOLR
40.0000 mg | Freq: Three times a day (TID) | INTRAMUSCULAR | Status: DC
  Administered 2019-01-08 – 2019-01-11 (×9): 40 mg via INTRAVENOUS
  Filled 2019-01-08 (×10): qty 1

## 2019-01-08 NOTE — Progress Notes (Signed)
Tammy Ball c/o of dry eyes. I sent a text to Dr Tana Coast

## 2019-01-08 NOTE — Telephone Encounter (Signed)
FYI to provider

## 2019-01-08 NOTE — Progress Notes (Signed)
[facility]                                                                              Patient Demographics  Tammy Ball, is a 78 y.o. female, DOB - 11-10-1941, AQT:[id number]  Admit date - 01/07/2019   Admitting Physician Lavina Hamman, MD  Outpatient Primary MD for the patient is Forrest Moron, MD  Outpatient specialists:   LOS - 1  days   Medical records reviewed and are as summarized below:    Chief Complaint  Patient presents with  . Shortness of Breath  . Cough  . Fever       Brief summary   PAULETT Ball is a 78 y.o. female with Past medical history of COPD, right lung cancer with left metastasis, renal cell cancer, HTN, HLD, recent admission for pneumonia.  Patient presented to the ED with coughing, change in her sputum culture from clear to dark yellow, wheezing. Patient was recently hospitalized between 12/25/2018-12/30/2018.  Seen by CCM.  Treated with Ceftin and prednisone on discharge and recently seen PCP yesterday prior to admission. Patient reports that after her discharge from 01/04/2019 she started having increasing cough and shortness of breath. Chest x-ray showed new infiltrate in the left lower lobe, was admitted for further work-up.   Assessment & Plan    Principal Problem:   HCAP (healthcare-associated pneumonia)/Streptococcus pneumonia -Patient presenting with worsening cough, shortness of breath, wheezing.  Chest x-ray with new infiltrate in the left lower lobe -Continue IV vancomycin and cefepime -Leukocytosis improving, flu panel negative -Urine strep antigen positive  Active Problems:  Acute on chronic hypoxic respiratory failure secondary to COPD exacerbation in the setting of HCAP  -Still wheezing, increase IV Solu-Medrol to 40 mg every 8 hours, continue scheduled nebs -Continue Dulera, flutter valve, O2 -Sinus x-rays negative for any sinusitis, air-fluid levels  Essential hypertension -BP currently stable,  continue Toprol-XL  GERD Continue PPI    Non-small cell carcinoma of right lung, stage 2 (HCC) with history of right lobectomy of lung, status post stereotactic radiation, left lung metastasis -Recent CT scan of the chest done in 11/2018 showed band of pleural parenchymal thickening in the left lower lobe, will need repeat CT chest or PET scan outpatient for follow-up - Dr Julien Nordmann to follow, added in consult team  Hyperlipidemia -Continue statin   Code Status: Full code DVT Prophylaxis:  Lovenox  Family Communication: Discussed in detail with the patient, all imaging results, lab results explained to the patient.    Disposition Plan: Will likely need 1 to 2 days of IV antibiotics, Solu-Medrol and breathing treatments.  Currently wheezing  Time Spent in minutes   25 minutes  Procedures:  None  Consultants:   Oncology  Antimicrobials:   Anti-infectives (From admission, onward)   Start     Dose/Rate Route Frequency Ordered Stop   01/09/19 0200  vancomycin (VANCOCIN) IVPB 1000 mg/200 mL premix     1,000 mg 200 mL/hr over 60 Minutes Intravenous Every 36 hours 01/07/19 1546     01/08/19 0000  ceFEPIme (MAXIPIME) 1 g in sodium chloride 0.9 % 100 mL IVPB  1 g 200 mL/hr over 30 Minutes Intravenous Every 12 hours 01/07/19 1521 01/15/19 2359   01/07/19 1430  vancomycin (VANCOCIN) IVPB 1000 mg/200 mL premix     1,000 mg 200 mL/hr over 60 Minutes Intravenous  Once 01/07/19 1425 01/07/19 1602   01/07/19 1430  piperacillin-tazobactam (ZOSYN) IVPB 3.375 g     3.375 g 100 mL/hr over 30 Minutes Intravenous  Once 01/07/19 1425 01/07/19 1602          Medications  Scheduled Meds: . aspirin EC  81 mg Oral Daily  . atorvastatin  40 mg Oral q1800  . benzonatate  100 mg Oral TID  . dextromethorphan-guaiFENesin  1 tablet Oral BID  . enoxaparin (LOVENOX) injection  40 mg Subcutaneous Q24H  . fluticasone  2 spray Each Nare Daily  . ipratropium-albuterol  3 mL Nebulization TID  .  methylPREDNISolone (SOLU-MEDROL) injection  40 mg Intravenous Q12H  . metoprolol succinate  50 mg Oral Daily  . mometasone-formoterol  2 puff Inhalation BID  . pantoprazole  40 mg Oral Q0600   Continuous Infusions: . ceFEPime (MAXIPIME) IV 1 g (01/08/19 0047)  . lactated ringers 75 mL/hr at 01/07/19 1901  . [START ON 01/09/2019] vancomycin     PRN Meds:.albuterol, sodium chloride, tiZANidine, traMADol, traZODone      Subjective:   Terease Marcotte was seen and examined today.  Still diffusely wheezing, no fevers or chills.  Shortness of breath is improving.  Patient denies dizziness, chest pain, abdominal pain, N/V/D/C, new weakness, numbess, tingling. No acute events overnight.    Objective:   Vitals:   01/07/19 2150 01/08/19 0628 01/08/19 0905 01/08/19 0909  BP: 108/68 125/76    Pulse: 74 79    Resp: 18 18    Temp: 98 F (36.7 C) 98 F (36.7 C)    TempSrc: Oral Oral    SpO2: 92% 93% 94% 94%  Weight:      Height:        Intake/Output Summary (Last 24 hours) at 01/08/2019 1230 Last data filed at 01/08/2019 0930 Gross per 24 hour  Intake 1475.15 ml  Output 1300 ml  Net 175.15 ml     Wt Readings from Last 3 Encounters:  01/07/19 78 kg  01/06/19 78.8 kg  12/30/18 77.2 kg     Exam  General: Alert and oriented x 3, NAD  Eyes:   HEENT:  Atraumatic, normocephalic, normal oropharynx  Cardiovascular: S1 S2 auscultated, Regular rate and rhythm.  Respiratory: Bilateral expiratory wheezing  Gastrointestinal: Soft, nontender, nondistended, + bowel sounds  Ext: no pedal edema bilaterally  Neuro: No new deficits  Musculoskeletal: No digital cyanosis, clubbing  Skin: No rashes  Psych: Normal affect and demeanor, alert and oriented x3    Data Reviewed:  I have personally reviewed following labs and imaging studies  Micro Results No results found for this or any previous visit (from the past 240 hour(s)).  Radiology Reports Dg Sinuses Complete  Result Date:  01/07/2019 CLINICAL DATA:  Recently discharged from hospital with pneumonia. History of lung cancer. Patient describes nasal congestion and postnasal drip. EXAM: PARANASAL SINUSES - COMPLETE 3 + VIEW COMPARISON:  None. FINDINGS: The paranasal sinus are aerated. There is no evidence of sinus opacification air-fluid levels or mucosal thickening. No significant bone abnormalities are seen. IMPRESSION: Negative. Electronically Signed   By: Franki Cabot M.D.   On: 01/07/2019 17:12   Dg Chest 2 View  Result Date: 01/07/2019 CLINICAL DATA:  Shortness of breath and productive cough.  EXAM: CHEST - 2 VIEW COMPARISON:  12/29/2018 FINDINGS: The cardiac silhouette, mediastinal and hilar contours are stable. Patchy left lower lobe airspace opacity consistent with pneumonia. No pleural effusion. The right lung is clear. IMPRESSION: Left lower lobe infiltrate. Electronically Signed   By: Marijo Sanes M.D.   On: 01/07/2019 13:41   Dg Chest 2 View  Result Date: 12/29/2018 CLINICAL DATA:  Shortness of breath. EXAM: CHEST - 2 VIEW COMPARISON:  Radiograph of December 25, 2018. FINDINGS: Stable cardiomediastinal silhouette. No pneumothorax or pleural effusion is noted. Both lungs are clear. Status post kyphoplasty at multiple levels of upper thoracic spine. IMPRESSION: No active cardiopulmonary disease. Electronically Signed   By: Marijo Conception, M.D.   On: 12/29/2018 15:02   Dg Chest 2 View  Result Date: 12/25/2018 CLINICAL DATA:  Patient here from home with complaints of SOB since last Tuesday. Lung cancer patient. Reports that dr place her on portable O2 on Tuesday. Increase in cough. Productive. Hx COPD, HTN, renal cell carcinoma. EXAM: CHEST - 2 VIEW COMPARISON:  CT of the chest on 11/25/2018, chest x-ray on 02/19/2018 FINDINGS: Lungs are hyperinflated with. The heart size is normal. No pulmonary edema. No focal consolidations. Focal streaky opacity at the posterior aspect of the lung likely represents atelectasis or  early infiltrates in both lung bases. There is mild perihilar peribronchial thickening. Remote UPPER thoracic vertebral augmentation. Remote cervical spine fusion. IMPRESSION: 1. Hyperinflation and bronchitic changes. 2. Bibasilar atelectasis or early infiltrates. Electronically Signed   By: Nolon Nations M.D.   On: 12/25/2018 13:43    Lab Data:  CBC: Recent Labs  Lab 01/07/19 1259 01/08/19 0321  WBC 19.2* 11.2*  NEUTROABS 17.7* 10.5*  HGB 12.5 11.0*  HCT 39.0 34.7*  MCV 98.0 98.3  PLT 289 536   Basic Metabolic Panel: Recent Labs  Lab 01/07/19 1259 01/08/19 0321  NA 137 139  K 4.5 4.2  CL 95* 102  CO2 34* 30  GLUCOSE 133* 144*  BUN 20 25*  CREATININE 1.18* 1.13*  CALCIUM 9.4 8.6*   GFR: Estimated Creatinine Clearance: 42.5 mL/min (A) (by C-G formula based on SCr of 1.13 mg/dL (H)). Liver Function Tests: Recent Labs  Lab 01/07/19 1259 01/08/19 0321  AST 19 15  ALT 20 20  ALKPHOS 65 52  BILITOT 1.3* 0.9  PROT 7.3 6.3*  ALBUMIN 3.9 3.1*   No results for input(s): LIPASE, AMYLASE in the last 168 hours. No results for input(s): AMMONIA in the last 168 hours. Coagulation Profile: No results for input(s): INR, PROTIME in the last 168 hours. Cardiac Enzymes: No results for input(s): CKTOTAL, CKMB, CKMBINDEX, TROPONINI in the last 168 hours. BNP (last 3 results) No results for input(s): PROBNP in the last 8760 hours. HbA1C: No results for input(s): HGBA1C in the last 72 hours. CBG: No results for input(s): GLUCAP in the last 168 hours. Lipid Profile: No results for input(s): CHOL, HDL, LDLCALC, TRIG, CHOLHDL, LDLDIRECT in the last 72 hours. Thyroid Function Tests: No results for input(s): TSH, T4TOTAL, FREET4, T3FREE, THYROIDAB in the last 72 hours. Anemia Panel: No results for input(s): VITAMINB12, FOLATE, FERRITIN, TIBC, IRON, RETICCTPCT in the last 72 hours. Urine analysis:    Component Value Date/Time   COLORURINE YELLOW 12/25/2018 1320   APPEARANCEUR  CLEAR 12/25/2018 1320   LABSPEC 1.016 12/25/2018 1320   LABSPEC 1.010 10/18/2015 0959   PHURINE 5.0 12/25/2018 1320   GLUCOSEU NEGATIVE 12/25/2018 1320   GLUCOSEU Negative 10/18/2015 0959   HGBUR NEGATIVE  12/25/2018 [place] 12/25/2018 1320   BILIRUBINUR negative 02/19/2018 0904   BILIRUBINUR Negative 10/18/2015 0959   KETONESUR NEGATIVE 12/25/2018 1320   PROTEINUR NEGATIVE 12/25/2018 1320   UROBILINOGEN 0.2 02/19/2018 0904   UROBILINOGEN 0.2 10/18/2015 0959   NITRITE NEGATIVE 12/25/2018 1320   LEUKOCYTESUR MODERATE (A) 12/25/2018 1320   LEUKOCYTESUR Negative 10/18/2015 0959     Ripudeep Rai M.D. [facility] 01/08/2019, 12:30 PM  Pager: [phone] Between 7am to 7pm - call Pager - [phone]  After 7pm go to [email or web address] - password [email or web address]  Call night coverage person covering after 7pm

## 2019-01-09 ENCOUNTER — Inpatient Hospital Stay (HOSPITAL_COMMUNITY): Payer: Medicare Other

## 2019-01-09 LAB — CBC
HCT: 33.4 % — ABNORMAL LOW (ref 36.0–46.0)
Hemoglobin: 10.4 g/dL — ABNORMAL LOW (ref 12.0–15.0)
MCH: 30.9 pg (ref 26.0–34.0)
MCHC: 31.1 g/dL (ref 30.0–36.0)
MCV: 99.1 fL (ref 80.0–100.0)
Platelets: 254 10*3/uL (ref 150–400)
RBC: 3.37 MIL/uL — ABNORMAL LOW (ref 3.87–5.11)
RDW: 16.8 % — ABNORMAL HIGH (ref 11.5–15.5)
WBC: 15 10*3/uL — ABNORMAL HIGH (ref 4.0–10.5)
nRBC: 0 % (ref 0.0–0.2)

## 2019-01-09 LAB — BASIC METABOLIC PANEL
Anion gap: 6 (ref 5–15)
BUN: 33 mg/dL — ABNORMAL HIGH (ref 8–23)
CO2: 30 mmol/L (ref 22–32)
Calcium: 8.8 mg/dL — ABNORMAL LOW (ref 8.9–10.3)
Chloride: 105 mmol/L (ref 98–111)
Creatinine, Ser: 1.06 mg/dL — ABNORMAL HIGH (ref 0.44–1.00)
GFR calc Af Amer: 59 mL/min — ABNORMAL LOW (ref >60)
GFR calc non Af Amer: 51 mL/min — ABNORMAL LOW (ref >60)
Glucose, Bld: 145 mg/dL — ABNORMAL HIGH (ref 70–99)
Potassium: 4.6 mmol/L (ref 3.5–5.1)
Sodium: 141 mmol/L (ref 135–145)

## 2019-01-09 MED ORDER — SODIUM CHLORIDE (PF) 0.9 % IJ SOLN
INTRAMUSCULAR | Status: AC
  Filled 2019-01-09: qty 50

## 2019-01-09 MED ORDER — POLYVINYL ALCOHOL 1.4 % OP SOLN
1.0000 [drp] | OPHTHALMIC | Status: DC | PRN
  Administered 2019-01-09: 1 [drp] via OPHTHALMIC
  Filled 2019-01-09: qty 15

## 2019-01-09 MED ORDER — IOHEXOL 300 MG/ML  SOLN
75.0000 mL | Freq: Once | INTRAMUSCULAR | Status: AC | PRN
  Administered 2019-01-09: 75 mL via INTRAVENOUS

## 2019-01-09 MED ORDER — IPRATROPIUM-ALBUTEROL 0.5-2.5 (3) MG/3ML IN SOLN
3.0000 mL | Freq: Four times a day (QID) | RESPIRATORY_TRACT | Status: DC
  Administered 2019-01-09 – 2019-01-11 (×9): 3 mL via RESPIRATORY_TRACT
  Filled 2019-01-09 (×9): qty 3

## 2019-01-09 MED ORDER — CLONAZEPAM 0.5 MG PO TABS
0.2500 mg | ORAL_TABLET | Freq: Every day | ORAL | Status: DC
  Administered 2019-01-09 – 2019-01-11 (×3): 0.25 mg via ORAL
  Filled 2019-01-09 (×3): qty 1

## 2019-01-09 MED ORDER — BUDESONIDE 0.5 MG/2ML IN SUSP
0.5000 mg | Freq: Two times a day (BID) | RESPIRATORY_TRACT | Status: DC
  Administered 2019-01-09 – 2019-01-11 (×4): 0.5 mg via RESPIRATORY_TRACT
  Filled 2019-01-09 (×4): qty 2

## 2019-01-09 MED ORDER — SODIUM CHLORIDE 0.9 % IV SOLN
INTRAVENOUS | Status: DC
  Administered 2019-01-09 – 2019-01-10 (×2): via INTRAVENOUS

## 2019-01-09 MED ORDER — ACETAMINOPHEN 325 MG PO TABS
650.0000 mg | ORAL_TABLET | Freq: Four times a day (QID) | ORAL | Status: DC | PRN
  Administered 2019-01-09: 650 mg via ORAL
  Filled 2019-01-09: qty 2

## 2019-01-09 MED ORDER — HYDRALAZINE HCL 20 MG/ML IJ SOLN
10.0000 mg | Freq: Four times a day (QID) | INTRAMUSCULAR | Status: DC | PRN
  Administered 2019-01-09 – 2019-01-10 (×2): 10 mg via INTRAVENOUS
  Filled 2019-01-09 (×2): qty 1

## 2019-01-09 MED ORDER — TRAZODONE HCL 50 MG PO TABS
50.0000 mg | ORAL_TABLET | Freq: Every day | ORAL | Status: DC
  Administered 2019-01-09 – 2019-01-10 (×2): 50 mg via ORAL
  Filled 2019-01-09 (×2): qty 1

## 2019-01-09 NOTE — Progress Notes (Signed)
Better options for I.V. placement, current placement per patient request.

## 2019-01-09 NOTE — Progress Notes (Signed)
[facility]                                                                              Patient Demographics  Tammy Ball, is a 78 y.o. female, DOB - 10/09/1941, KWI:[id number]  Admit date - 01/07/2019   Admitting Physician Lavina Hamman, MD  Outpatient Primary MD for the patient is Forrest Moron, MD  Outpatient specialists:   LOS - 2  days   Medical records reviewed and are as summarized below:    Chief Complaint  Patient presents with  . Shortness of Breath  . Cough  . Fever       Brief summary   Tammy Ball is a 78 y.o. female with Past medical history of COPD, right lung cancer with left metastasis, renal cell cancer, HTN, HLD, recent admission for pneumonia.  Patient presented to the ED with coughing, change in her sputum culture from clear to dark yellow, wheezing. Patient was recently hospitalized between 12/25/2018-12/30/2018.  Seen by CCM.  Treated with Ceftin and prednisone on discharge and recently seen PCP yesterday prior to admission. Patient reports that after her discharge from 01/04/2019 she started having increasing cough and shortness of breath. Chest x-ray showed new infiltrate in the left lower lobe, was admitted for further work-up.   Assessment & Plan    Principal Problem:   HCAP (healthcare-associated pneumonia)/Streptococcus pneumonia -Patient presented with worsening cough, shortness of breath, wheezing.  Chest x-ray with new infiltrate in the left lower lobe -Plans are negative, HIV negative, urine strep antigen positive -Vancomycin dc'ed on 2/27, continue IV cefepime  Active Problems:  Acute on chronic hypoxic respiratory failure secondary to COPD exacerbation in the setting of HCAP  -Still wheezing bilaterally, continue IV Solu-Medrol, scheduled nebs  -DC Dulera, placed on Pulmicort 0.5 mg twice daily  -Increase scheduled nebs every 6 hours.  Continue flutter valve, O2 -Sinus x-rays negative for any sinusitis,  air-fluid levels  Essential hypertension -Overnight BP elevated, likely due to anxiety -Currently stable, continue Toprol-XL, hydralazine IV as needed with parameters  GERD Continue PPI    Non-small cell carcinoma of right lung, stage 2 (HCC) with history of right lobectomy of lung, status post stereotactic radiation, left lung metastasis -Recent CT scan of the chest done in 11/2018 showed band of pleural parenchymal thickening in the left lower lobe, will need repeat CT chest or PET scan outpatient for follow-up -Discussed with Dr. Julien Nordmann, recommended repeat CT chest and outpatient follow-up with oncology  Hyperlipidemia -Continue statin  Anxiety Placed on low-dose Klonopin daily, if improves anxiety, will likely need PRN at home   Code Status: Full code DVT Prophylaxis:  Lovenox  Family Communication: Discussed in detail with the patient, all imaging results, lab results explained to the patient.    Disposition Plan: Follow CT chest, hopefully DC home in a.m. if wheezing improved  Time Spent in minutes   25 minutes  Procedures:  None  Consultants:   Oncology  Antimicrobials:   Anti-infectives (From admission, onward)   Start     Dose/Rate Route Frequency Ordered Stop   01/09/19 0200  vancomycin (VANCOCIN) IVPB 1000 mg/200  mL premix  Status:  Discontinued     1,000 mg 200 mL/hr over 60 Minutes Intravenous Every 36 hours 01/07/19 1546 01/08/19 1352   01/08/19 0000  ceFEPIme (MAXIPIME) 1 g in sodium chloride 0.9 % 100 mL IVPB     1 g 200 mL/hr over 30 Minutes Intravenous Every 12 hours 01/07/19 1521 01/15/19 2359   01/07/19 1430  vancomycin (VANCOCIN) IVPB 1000 mg/200 mL premix     1,000 mg 200 mL/hr over 60 Minutes Intravenous  Once 01/07/19 1425 01/07/19 1602   01/07/19 1430  piperacillin-tazobactam (ZOSYN) IVPB 3.375 g     3.375 g 100 mL/hr over 30 Minutes Intravenous  Once 01/07/19 1425 01/07/19 1602         Medications  Scheduled Meds: . aspirin EC  81  mg Oral Daily  . atorvastatin  40 mg Oral q1800  . benzonatate  100 mg Oral TID  . budesonide (PULMICORT) nebulizer solution  0.5 mg Nebulization BID  . clonazePAM  0.25 mg Oral Daily  . dextromethorphan-guaiFENesin  1 tablet Oral BID  . enoxaparin (LOVENOX) injection  40 mg Subcutaneous Q24H  . fluticasone  2 spray Each Nare Daily  . ipratropium-albuterol  3 mL Nebulization Q6H  . methylPREDNISolone (SOLU-MEDROL) injection  40 mg Intravenous Q8H  . metoprolol succinate  50 mg Oral Daily  . pantoprazole  40 mg Oral Q0600  . sodium chloride (PF)      . traZODone  50 mg Oral QHS   Continuous Infusions: . sodium chloride 60 mL/hr at 01/09/19 0836  . ceFEPime (MAXIPIME) IV 1 g (01/09/19 0017)   PRN Meds:.acetaminophen, albuterol, hydrALAZINE, polyvinyl alcohol, sodium chloride, tiZANidine, traMADol      Subjective:   Tammy Ball was seen and examined today.  Still wheezing bilaterally, phlegm improving.  No chest pain, shortness of breath is slightly better.  Did not sleep well last night due to wheezing and anxiety.  No fevers.  Patient denies dizziness, chest pain, abdominal pain, N/V/D/C, new weakness, numbess, tingling.   Objective:   Vitals:   01/09/19 0559 01/09/19 0658 01/09/19 0745 01/09/19 0753  BP: (!) 170/95 (!) 182/99 138/62   Pulse: 77  79 99  Resp:    18  Temp: 97.8 F (36.6 C)     TempSrc: Oral     SpO2: 93%   93%  Weight:      Height:        Intake/Output Summary (Last 24 hours) at 01/09/2019 1156 Last data filed at 01/09/2019 1131 Gross per 24 hour  Intake 2160 ml  Output 2700 ml  Net -540 ml     Wt Readings from Last 3 Encounters:  01/07/19 78 kg  01/06/19 78.8 kg  12/30/18 77.2 kg   Physical Exam  General: Alert and oriented x 3, NAD  Eyes:  HEENT:  Atraumatic, normocephalic  Cardiovascular: S1 S2 clear, no murmurs, RRR. No pedal edema b/l  Respiratory: Bilateral expiratory wheezing  Gastrointestinal: Soft, nontender, nondistended,  NBS  Ext: no pedal edema bilaterally  Neuro: no new deficits  Musculoskeletal: No cyanosis, clubbing  Skin: No rashes  Psych: Normal affect and demeanor, alert and oriented x3     Data Reviewed:  I have personally reviewed following labs and imaging studies  Micro Results Recent Results (from the past 240 hour(s))  MRSA PCR Screening     Status: None   Collection Time: 01/08/19 11:22 AM  Result Value Ref Range Status   MRSA by PCR NEGATIVE NEGATIVE Final  Comment:        The GeneXpert MRSA Assay (FDA approved for NASAL specimens only), is one component of a comprehensive MRSA colonization surveillance program. It is not intended to diagnose MRSA infection nor to guide or monitor treatment for MRSA infections. Performed at [facility], [place] [address] [place], [place] [address]   Culture, sputum-assessment     Status: None (Preliminary result)   Collection Time: 01/08/19  9:44 PM  Result Value Ref Range Status   Specimen Description SPUTUM  Final   Special Requests NONE  Final   Sputum evaluation   Final    THIS SPECIMEN IS ACCEPTABLE FOR SPUTUM CULTURE Performed at [facility], [place] [address] [place], [place] [address]    Report Status PENDING  Incomplete  Culture, respiratory     Status: None (Preliminary result)   Collection Time: 01/08/19  9:44 PM  Result Value Ref Range Status   Specimen Description   Final    SPUTUM Performed at [place] [address], [place], [place] [address]    Special Requests   Final    NONE Reflexed from [id number] Performed at [facility], [place] [address], [place], [place] [address]    Gram Stain   Final    RARE WBC PRESENT, PREDOMINANTLY PMN NO ORGANISMS SEEN Performed at [facility], [place] [address], [place], [place] [address]    Culture PENDING  Incomplete   Report Status PENDING  Incomplete    Radiology Reports Dg Sinuses  Complete  Result Date: 01/07/2019 CLINICAL DATA:  Recently discharged from hospital with pneumonia. History of lung cancer. Patient describes nasal congestion and postnasal drip. EXAM: PARANASAL SINUSES - COMPLETE 3 + VIEW COMPARISON:  None. FINDINGS: The paranasal sinus are aerated. There is no evidence of sinus opacification air-fluid levels or mucosal thickening. No significant bone abnormalities are seen. IMPRESSION: Negative. Electronically Signed   By: Franki Cabot M.D.   On: 01/07/2019 17:12   Dg Chest 2 View  Result Date: 01/07/2019 CLINICAL DATA:  Shortness of breath and productive cough. EXAM: CHEST - 2 VIEW COMPARISON:  12/29/2018 FINDINGS: The cardiac silhouette, mediastinal and hilar contours are stable. Patchy left lower lobe airspace opacity consistent with pneumonia. No pleural effusion. The right lung is clear. IMPRESSION: Left lower lobe infiltrate. Electronically Signed   By: Marijo Sanes M.D.   On: 01/07/2019 13:41   Dg Chest 2 View  Result Date: 12/29/2018 CLINICAL DATA:  Shortness of breath. EXAM: CHEST - 2 VIEW COMPARISON:  Radiograph of December 25, 2018. FINDINGS: Stable cardiomediastinal silhouette. No pneumothorax or pleural effusion is noted. Both lungs are clear. Status post kyphoplasty at multiple levels of upper thoracic spine. IMPRESSION: No active cardiopulmonary disease. Electronically Signed   By: Marijo Conception, M.D.   On: 12/29/2018 15:02   Dg Chest 2 View  Result Date: 12/25/2018 CLINICAL DATA:  Patient here from home with complaints of SOB since last Tuesday. Lung cancer patient. Reports that dr place her on portable O2 on Tuesday. Increase in cough. Productive. Hx COPD, HTN, renal cell carcinoma. EXAM: CHEST - 2 VIEW COMPARISON:  CT of the chest on 11/25/2018, chest x-ray on 02/19/2018 FINDINGS: Lungs are hyperinflated with. The heart size is normal. No pulmonary edema. No focal consolidations. Focal streaky opacity at the posterior aspect of the lung likely  represents atelectasis or early infiltrates in both lung bases. There is mild perihilar peribronchial thickening. Remote UPPER thoracic vertebral  augmentation. Remote cervical spine fusion. IMPRESSION: 1. Hyperinflation and bronchitic changes. 2. Bibasilar atelectasis or early infiltrates. Electronically Signed   By: Nolon Nations M.D.   On: 12/25/2018 13:43    Lab Data:  CBC: Recent Labs  Lab 01/07/19 1259 01/08/19 0321 01/09/19 0330  WBC 19.2* 11.2* 15.0*  NEUTROABS 17.7* 10.5*  --   HGB 12.5 11.0* 10.4*  HCT 39.0 34.7* 33.4*  MCV 98.0 98.3 99.1  PLT 289 265 562   Basic Metabolic Panel: Recent Labs  Lab 01/07/19 1259 01/08/19 0321 01/09/19 0330  NA 137 139 141  K 4.5 4.2 4.6  CL 95* 102 105  CO2 34* 30 30  GLUCOSE 133* 144* 145*  BUN 20 25* 33*  CREATININE 1.18* 1.13* 1.06*  CALCIUM 9.4 8.6* 8.8*   GFR: Estimated Creatinine Clearance: 45.3 mL/min (A) (by C-G formula based on SCr of 1.06 mg/dL (H)). Liver Function Tests: Recent Labs  Lab 01/07/19 1259 01/08/19 0321  AST 19 15  ALT 20 20  ALKPHOS 65 52  BILITOT 1.3* 0.9  PROT 7.3 6.3*  ALBUMIN 3.9 3.1*   No results for input(s): LIPASE, AMYLASE in the last 168 hours. No results for input(s): AMMONIA in the last 168 hours. Coagulation Profile: No results for input(s): INR, PROTIME in the last 168 hours. Cardiac Enzymes: No results for input(s): CKTOTAL, CKMB, CKMBINDEX, TROPONINI in the last 168 hours. BNP (last 3 results) No results for input(s): PROBNP in the last 8760 hours. HbA1C: No results for input(s): HGBA1C in the last 72 hours. CBG: No results for input(s): GLUCAP in the last 168 hours. Lipid Profile: No results for input(s): CHOL, HDL, LDLCALC, TRIG, CHOLHDL, LDLDIRECT in the last 72 hours. Thyroid Function Tests: No results for input(s): TSH, T4TOTAL, FREET4, T3FREE, THYROIDAB in the last 72 hours. Anemia Panel: No results for input(s): VITAMINB12, FOLATE, FERRITIN, TIBC, IRON, RETICCTPCT  in the last 72 hours. Urine analysis:    Component Value Date/Time   COLORURINE YELLOW 12/25/2018 1320   APPEARANCEUR CLEAR 12/25/2018 1320   LABSPEC 1.016 12/25/2018 1320   LABSPEC 1.010 10/18/2015 0959   PHURINE 5.0 12/25/2018 1320   GLUCOSEU NEGATIVE 12/25/2018 1320   GLUCOSEU Negative 10/18/2015 0959   HGBUR NEGATIVE 12/25/2018 1320   BILIRUBINUR NEGATIVE 12/25/2018 1320   BILIRUBINUR negative 02/19/2018 0904   BILIRUBINUR Negative 10/18/2015 0959   KETONESUR NEGATIVE 12/25/2018 1320   PROTEINUR NEGATIVE 12/25/2018 1320   UROBILINOGEN 0.2 02/19/2018 0904   UROBILINOGEN 0.2 10/18/2015 0959   NITRITE NEGATIVE 12/25/2018 1320   LEUKOCYTESUR MODERATE (A) 12/25/2018 1320   LEUKOCYTESUR Negative 10/18/2015 0959     Ripudeep Rai M.D. [facility] 01/09/2019, 11:56 AM  Pager: [phone] Between 7am to 7pm - call Pager - [phone]  After 7pm go to [email or web address] - password [email or web address]  Call night coverage person covering after 7pm

## 2019-01-09 NOTE — Care Management Important Message (Signed)
Important Message  Patient Details  Name: Tammy Ball MRN: [id number] Date of Birth: [birth year]   Medicare Important Message Given:  Yes    Kerin Salen 01/09/2019, 11:32 AMImportant Message  Patient Details  Name: Tammy Ball MRN: [id number] Date of Birth: 1941/01/08   Medicare Important Message Given:  Yes    Kerin Salen 01/09/2019, 11:32 AM

## 2019-01-10 DIAGNOSIS — C3491 Malignant neoplasm of unspecified part of right bronchus or lung: Secondary | ICD-10-CM

## 2019-01-10 LAB — BASIC METABOLIC PANEL
Anion gap: 5 (ref 5–15)
BUN: 27 mg/dL — ABNORMAL HIGH (ref 8–23)
CO2: 29 mmol/L (ref 22–32)
Calcium: 8.4 mg/dL — ABNORMAL LOW (ref 8.9–10.3)
Chloride: 106 mmol/L (ref 98–111)
Creatinine, Ser: 0.98 mg/dL (ref 0.44–1.00)
GFR calc Af Amer: >60 mL/min (ref >60)
GFR calc non Af Amer: 56 mL/min — ABNORMAL LOW (ref >60)
Glucose, Bld: 120 mg/dL — ABNORMAL HIGH (ref 70–99)
Potassium: 4.9 mmol/L (ref 3.5–5.1)
Sodium: 140 mmol/L (ref 135–145)

## 2019-01-10 LAB — CBC
HCT: 33.3 % — ABNORMAL LOW (ref 36.0–46.0)
Hemoglobin: 10.4 g/dL — ABNORMAL LOW (ref 12.0–15.0)
MCH: 31.2 pg (ref 26.0–34.0)
MCHC: 31.2 g/dL (ref 30.0–36.0)
MCV: 100 fL (ref 80.0–100.0)
Platelets: 245 10*3/uL (ref 150–400)
RBC: 3.33 MIL/uL — ABNORMAL LOW (ref 3.87–5.11)
RDW: 17.2 % — ABNORMAL HIGH (ref 11.5–15.5)
WBC: 9.1 10*3/uL (ref 4.0–10.5)
nRBC: 0 % (ref 0.0–0.2)

## 2019-01-10 MED ORDER — POLYETHYLENE GLYCOL 3350 17 G PO PACK
17.0000 g | PACK | Freq: Once | ORAL | Status: AC
  Administered 2019-01-10: 17 g via ORAL
  Filled 2019-01-10: qty 1

## 2019-01-10 MED ORDER — DIPHENHYDRAMINE HCL 25 MG PO CAPS
25.0000 mg | ORAL_CAPSULE | Freq: Once | ORAL | Status: AC
  Administered 2019-01-10: 25 mg via ORAL
  Filled 2019-01-10: qty 1

## 2019-01-10 NOTE — Progress Notes (Signed)
[facility]                                                                              Patient Demographics  Tammy Ball, is a 78 y.o. female, DOB - 28-Aug-1941, HAF:[id number]  Admit date - 01/07/2019   Admitting Physician Lavina Hamman, MD  Outpatient Primary MD for the patient is Forrest Moron, MD  Outpatient specialists:   LOS - 3  days   Medical records reviewed and are as summarized below:    Chief Complaint  Patient presents with  . Shortness of Breath  . Cough  . Fever       Brief summary   Tammy Ball is a 78 y.o. female with Past medical history of COPD, right lung cancer with left metastasis, renal cell cancer, HTN, HLD, recent admission for pneumonia.  Patient presented to the ED with coughing, change in her sputum culture from clear to dark yellow, wheezing. Patient was recently hospitalized between 12/25/2018-12/30/2018.  Seen by CCM.  Treated with Ceftin and prednisone on discharge and recently seen PCP yesterday prior to admission. Patient reports that after her discharge from 01/04/2019 she started having increasing cough and shortness of breath. Chest x-ray showed new infiltrate in the left lower lobe, was admitted for further work-up.   Assessment & Plan    Principal Problem:   HCAP (healthcare-associated pneumonia)/Streptococcus pneumonia -Patient presented with worsening cough, shortness of breath, wheezing.  Chest x-ray with new infiltrate in the left lower lobe -Plans are negative, HIV negative, urine strep antigen positive -Vancomycin dc'ed on 2/27, continue IV cefepime   Active Problems:  Acute on chronic hypoxic respiratory failure secondary to COPD exacerbation in the setting of HCAP  -Wheezing persisting but slowly improving, will continue IV Solu-Medrol and scheduled nebs today, continue Pulmicort. -Increase scheduled nebs every 6 hours.  Continue flutter valve, O2 -Sinus x-rays negative for any sinusitis, air-fluid  levels -We will transition to oral prednisone in a.m. if continues to improve  Essential hypertension -Overnight BP elevated, likely due to anxiety -Currently stable, continue Toprol-XL, hydralazine IV as needed with parameters  GERD Continue PPI    Non-small cell carcinoma of right lung, stage 2 (HCC) with history of right lobectomy of lung, status post stereotactic radiation, left lung metastasis -Recent CT scan of the chest done in 11/2018 showed band of pleural parenchymal thickening in the left lower lobe, will need repeat CT chest or PET scan outpatient for follow-up -Repeat CT chest reviewed showed progressive bandlike area of airspace consolidation and/or atelectasis in the left lower lobe, favors sequelae of persistent chronic infection/pneumonia. -Outpatient follow-up with Dr. Julien Nordmann.  Hyperlipidemia -Continue statin  Anxiety Patient reports feeling a lot better today with low-dose Klonopin.  Requested for as needed at home.    Leukocytosis Secondary to steroids  Code Status: Full code DVT Prophylaxis:  Lovenox  Family Communication: Discussed in detail with the patient, all imaging results, lab results explained to the patient.    Disposition Plan: Still wheezing, hopefully DC home in a.m.  Time Spent in minutes   25 minutes  Procedures:  None  Consultants:   Oncology  Antimicrobials:   Anti-infectives (From admission, onward)   Start     Dose/Rate Route Frequency Ordered Stop   01/09/19 0200  vancomycin (VANCOCIN) IVPB 1000 mg/200 mL premix  Status:  Discontinued     1,000 mg 200 mL/hr over 60 Minutes Intravenous Every 36 hours 01/07/19 1546 01/08/19 1352   01/08/19 0000  ceFEPIme (MAXIPIME) 1 g in sodium chloride 0.9 % 100 mL IVPB     1 g 200 mL/hr over 30 Minutes Intravenous Every 12 hours 01/07/19 1521 01/15/19 2359   01/07/19 1430  vancomycin (VANCOCIN) IVPB 1000 mg/200 mL premix     1,000 mg 200 mL/hr over 60 Minutes Intravenous  Once 01/07/19 1425  01/07/19 1602   01/07/19 1430  piperacillin-tazobactam (ZOSYN) IVPB 3.375 g     3.375 g 100 mL/hr over 30 Minutes Intravenous  Once 01/07/19 1425 01/07/19 1602         Medications  Scheduled Meds: . aspirin EC  81 mg Oral Daily  . atorvastatin  40 mg Oral q1800  . benzonatate  100 mg Oral TID  . budesonide (PULMICORT) nebulizer solution  0.5 mg Nebulization BID  . clonazePAM  0.25 mg Oral Daily  . dextromethorphan-guaiFENesin  1 tablet Oral BID  . enoxaparin (LOVENOX) injection  40 mg Subcutaneous Q24H  . fluticasone  2 spray Each Nare Daily  . ipratropium-albuterol  3 mL Nebulization Q6H  . methylPREDNISolone (SOLU-MEDROL) injection  40 mg Intravenous Q8H  . metoprolol succinate  50 mg Oral Daily  . pantoprazole  40 mg Oral Q0600  . traZODone  50 mg Oral QHS   Continuous Infusions: . ceFEPime (MAXIPIME) IV 1 g (01/10/19 1225)   PRN Meds:.acetaminophen, albuterol, hydrALAZINE, polyvinyl alcohol, sodium chloride, tiZANidine, traMADol      Subjective:   Tammy Ball was seen and examined today.  Wheezing bilaterally however slowly and progressively improving.  No fevers.  Slept better last night.  Patient denies dizziness, chest pain, abdominal pain, N/V/D/C, new weakness, numbess, tingling.   Objective:   Vitals:   01/09/19 2107 01/10/19 0128 01/10/19 0540 01/10/19 0816  BP: (!) 145/88  128/70   Pulse: 91  83   Resp: 19  17   Temp: 99.4 F (37.4 C)  98.2 F (36.8 C)   TempSrc: Oral  Oral   SpO2: 95% 97% 95% 94%  Weight:      Height:        Intake/Output Summary (Last 24 hours) at 01/10/2019 1241 Last data filed at 01/10/2019 1006 Gross per 24 hour  Intake 2742.95 ml  Output 2750 ml  Net -7.05 ml     Wt Readings from Last 3 Encounters:  01/07/19 78 kg  01/06/19 78.8 kg  12/30/18 77.2 kg   Physical Exam  General: Alert and oriented x 3, NAD  Eyes:   HEENT:  Atraumatic, normocephalic  Cardiovascular: S1 S2 clear, no murmurs, RRR. No pedal edema  b/l  Respiratory: Bilateral expiratory wheezing improving from yesterday  Gastrointestinal: Soft, nontender, nondistended, NBS  Ext: no pedal edema bilaterally  Neuro: no new deficits  Musculoskeletal: No cyanosis, clubbing  Skin: No rashes  Psych: Normal affect and demeanor, alert and oriented x3     Data Reviewed:  I have personally reviewed following labs and imaging studies  Micro Results Recent Results (from the past 240 hour(s))  MRSA PCR Screening     Status: None   Collection Time: 01/08/19 11:22 AM  Result Value Ref Range Status   MRSA by PCR NEGATIVE  NEGATIVE Final    Comment:        The GeneXpert MRSA Assay (FDA approved for NASAL specimens only), is one component of a comprehensive MRSA colonization surveillance program. It is not intended to diagnose MRSA infection nor to guide or monitor treatment for MRSA infections. Performed at [facility], [place] [address] [place], [place] [address]   Culture, sputum-assessment     Status: None (Preliminary result)   Collection Time: 01/08/19  9:44 PM  Result Value Ref Range Status   Specimen Description SPUTUM  Final   Special Requests NONE  Final   Sputum evaluation   Final    THIS SPECIMEN IS ACCEPTABLE FOR SPUTUM CULTURE Performed at [facility], [place] [address] [place], [place] [address]    Report Status PENDING  Incomplete  Culture, respiratory     Status: None (Preliminary result)   Collection Time: 01/08/19  9:44 PM  Result Value Ref Range Status   Specimen Description   Final    SPUTUM Performed at [place] [address], [place], [place] [address]    Special Requests   Final    NONE Reflexed from [id number] Performed at [facility], [place] [address] [place], [place] [address]    Gram Stain   Final    RARE WBC PRESENT, PREDOMINANTLY PMN NO ORGANISMS SEEN    Culture   Final    CULTURE REINCUBATED FOR BETTER  GROWTH Performed at [facility], [place] [address], [place], [place] [address]    Report Status PENDING  Incomplete    Radiology Reports Dg Sinuses Complete  Result Date: 01/07/2019 CLINICAL DATA:  Recently discharged from hospital with pneumonia. History of lung cancer. Patient describes nasal congestion and postnasal drip. EXAM: PARANASAL SINUSES - COMPLETE 3 + VIEW COMPARISON:  None. FINDINGS: The paranasal sinus are aerated. There is no evidence of sinus opacification air-fluid levels or mucosal thickening. No significant bone abnormalities are seen. IMPRESSION: Negative. Electronically Signed   By: Franki Cabot M.D.   On: 01/07/2019 17:12   Dg Chest 2 View  Result Date: 01/07/2019 CLINICAL DATA:  Shortness of breath and productive cough. EXAM: CHEST - 2 VIEW COMPARISON:  12/29/2018 FINDINGS: The cardiac silhouette, mediastinal and hilar contours are stable. Patchy left lower lobe airspace opacity consistent with pneumonia. No pleural effusion. The right lung is clear. IMPRESSION: Left lower lobe infiltrate. Electronically Signed   By: Marijo Sanes M.D.   On: 01/07/2019 13:41   Dg Chest 2 View  Result Date: 12/29/2018 CLINICAL DATA:  Shortness of breath. EXAM: CHEST - 2 VIEW COMPARISON:  Radiograph of December 25, 2018. FINDINGS: Stable cardiomediastinal silhouette. No pneumothorax or pleural effusion is noted. Both lungs are clear. Status post kyphoplasty at multiple levels of upper thoracic spine. IMPRESSION: No active cardiopulmonary disease. Electronically Signed   By: Marijo Conception, M.D.   On: 12/29/2018 15:02   Dg Chest 2 View  Result Date: 12/25/2018 CLINICAL DATA:  Patient here from home with complaints of SOB since last Tuesday. Lung cancer patient. Reports that dr place her on portable O2 on Tuesday. Increase in cough. Productive. Hx COPD, HTN, renal cell carcinoma. EXAM: CHEST - 2 VIEW COMPARISON:  CT of the chest on 11/25/2018, chest x-ray on 02/19/2018 FINDINGS: Lungs  are hyperinflated with. The heart size is normal. No pulmonary edema. No focal consolidations. Focal streaky opacity at the posterior aspect of the lung likely represents atelectasis or early infiltrates  in both lung bases. There is mild perihilar peribronchial thickening. Remote UPPER thoracic vertebral augmentation. Remote cervical spine fusion. IMPRESSION: 1. Hyperinflation and bronchitic changes. 2. Bibasilar atelectasis or early infiltrates. Electronically Signed   By: Nolon Nations M.D.   On: 12/25/2018 13:43   Ct Chest W Contrast  Result Date: 01/09/2019 CLINICAL DATA:  Follow-up pneumonia. EXAM: CT CHEST WITH CONTRAST TECHNIQUE: Multidetector CT imaging of the chest was performed during intravenous contrast administration. CONTRAST:  69mL OMNIPAQUE IOHEXOL 300 MG/ML  SOLN COMPARISON:  CT chest 11/25/2018 FINDINGS: Cardiovascular: The heart size appears within normal limits. No pericardial effusion. Aortic atherosclerosis. Left main, lad, left circumflex coronary artery calcifications. Mediastinum/Nodes: Normal appearance of the thyroid gland. The trachea appears patent and is midline. Normal appearance of the esophagus. No enlarged axillary, supraclavicular, mediastinal or hilar lymph nodes. Lungs/Pleura: Moderate changes of centrilobular emphysema with diffuse bronchial wall thickening. No pleural effusion, previous right upper lobectomy. Paramediastinal fibrosis within the right lung is again noted compatible with changes of external beam radiation. There are no findings in the right lung to suggest recurrence of disease. Within the left lower lobe there is a bandlike area of atelectasis which appears increased from the previous exam, image 143/7. No central obstructing mass identified. Upper Abdomen: No acute abnormality. Musculoskeletal: The bones are diffusely osteopenic. T3 and T4 compression fractures are stable status post vertebral augmentation. Unchanged superior endplate fracture involving  T6. No new fractures. IMPRESSION: 1. Progressive bandlike area of airspace consolidation and/or atelectasis within the left lower lobe. This scratch set the appearance favors sequelae of persistent chronic infection/inflammation and/or aspiration. There are no specific findings highly specific for tumor. However, as the patient is at increased risk for lung cancer follow-up imaging is recommended to ensure stability and or resolution and to rule out underlying malignancy. Consider repeat examination within 3 months to assess for interval change. 2. Diffuse bronchial wall thickening with emphysema, as above; imaging findings suggestive of underlying COPD. 3. Aortic atherosclerosis and multi vessel coronary artery calcifications. Aortic Atherosclerosis (ICD10-I70.0) and Emphysema (ICD10-J43.9). Electronically Signed   By: Kerby Moors M.D.   On: 01/09/2019 14:36    Lab Data:  CBC: Recent Labs  Lab 01/07/19 1259 01/08/19 0321 01/09/19 0330 01/10/19 0409  WBC 19.2* 11.2* 15.0* 9.1  NEUTROABS 17.7* 10.5*  --   --   HGB 12.5 11.0* 10.4* 10.4*  HCT 39.0 34.7* 33.4* 33.3*  MCV 98.0 98.3 99.1 100.0  PLT 289 265 254 124   Basic Metabolic Panel: Recent Labs  Lab 01/07/19 1259 01/08/19 0321 01/09/19 0330 01/10/19 0409  NA 137 139 141 140  K 4.5 4.2 4.6 4.9  CL 95* 102 105 106  CO2 34* 30 30 29   GLUCOSE 133* 144* 145* 120*  BUN 20 25* 33* 27*  CREATININE 1.18* 1.13* 1.06* 0.98  CALCIUM 9.4 8.6* 8.8* 8.4*   GFR: Estimated Creatinine Clearance: 49 mL/min (by C-G formula based on SCr of 0.98 mg/dL). Liver Function Tests: Recent Labs  Lab 01/07/19 1259 01/08/19 0321  AST 19 15  ALT 20 20  ALKPHOS 65 52  BILITOT 1.3* 0.9  PROT 7.3 6.3*  ALBUMIN 3.9 3.1*   No results for input(s): LIPASE, AMYLASE in the last 168 hours. No results for input(s): AMMONIA in the last 168 hours. Coagulation Profile: No results for input(s): INR, PROTIME in the last 168 hours. Cardiac Enzymes: No  results for input(s): CKTOTAL, CKMB, CKMBINDEX, TROPONINI in the last 168 hours. BNP (last 3 results) No results  for input(s): PROBNP in the last 8760 hours. HbA1C: No results for input(s): HGBA1C in the last 72 hours. CBG: No results for input(s): GLUCAP in the last 168 hours. Lipid Profile: No results for input(s): CHOL, HDL, LDLCALC, TRIG, CHOLHDL, LDLDIRECT in the last 72 hours. Thyroid Function Tests: No results for input(s): TSH, T4TOTAL, FREET4, T3FREE, THYROIDAB in the last 72 hours. Anemia Panel: No results for input(s): VITAMINB12, FOLATE, FERRITIN, TIBC, IRON, RETICCTPCT in the last 72 hours. Urine analysis:    Component Value Date/Time   COLORURINE YELLOW 12/25/2018 1320   APPEARANCEUR CLEAR 12/25/2018 1320   LABSPEC 1.016 12/25/2018 1320   LABSPEC 1.010 10/18/2015 0959   PHURINE 5.0 12/25/2018 1320   GLUCOSEU NEGATIVE 12/25/2018 1320   GLUCOSEU Negative 10/18/2015 0959   HGBUR NEGATIVE 12/25/2018 1320   BILIRUBINUR NEGATIVE 12/25/2018 1320   BILIRUBINUR negative 02/19/2018 0904   BILIRUBINUR Negative 10/18/2015 0959   KETONESUR NEGATIVE 12/25/2018 1320   PROTEINUR NEGATIVE 12/25/2018 1320   UROBILINOGEN 0.2 02/19/2018 0904   UROBILINOGEN 0.2 10/18/2015 0959   NITRITE NEGATIVE 12/25/2018 1320   LEUKOCYTESUR MODERATE (A) 12/25/2018 1320   LEUKOCYTESUR Negative 10/18/2015 0959     Yuvin Bussiere M.D. [facility] 01/10/2019, 12:41 PM  Pager: [phone] Between 7am to 7pm - call Pager - [phone]  After 7pm go to [email or web address] - password [email or web address]  Call night coverage person covering after 7pm

## 2019-01-11 LAB — BASIC METABOLIC PANEL
Anion gap: 6 (ref 5–15)
BUN: 26 mg/dL — ABNORMAL HIGH (ref 8–23)
CO2: 30 mmol/L (ref 22–32)
Calcium: 8.6 mg/dL — ABNORMAL LOW (ref 8.9–10.3)
Chloride: 106 mmol/L (ref 98–111)
Creatinine, Ser: 0.98 mg/dL (ref 0.44–1.00)
GFR calc Af Amer: >60 mL/min (ref >60)
GFR calc non Af Amer: 56 mL/min — ABNORMAL LOW (ref >60)
Glucose, Bld: 130 mg/dL — ABNORMAL HIGH (ref 70–99)
Potassium: 4.8 mmol/L (ref 3.5–5.1)
Sodium: 142 mmol/L (ref 135–145)

## 2019-01-11 LAB — CULTURE, RESPIRATORY W GRAM STAIN: Culture: NORMAL

## 2019-01-11 LAB — CBC
HCT: 34.2 % — ABNORMAL LOW (ref 36.0–46.0)
Hemoglobin: 10.7 g/dL — ABNORMAL LOW (ref 12.0–15.0)
MCH: 31 pg (ref 26.0–34.0)
MCHC: 31.3 g/dL (ref 30.0–36.0)
MCV: 99.1 fL (ref 80.0–100.0)
Platelets: 231 10*3/uL (ref 150–400)
RBC: 3.45 MIL/uL — ABNORMAL LOW (ref 3.87–5.11)
RDW: 17.4 % — ABNORMAL HIGH (ref 11.5–15.5)
WBC: 6.9 10*3/uL (ref 4.0–10.5)
nRBC: 0 % (ref 0.0–0.2)

## 2019-01-11 MED ORDER — PREDNISONE 10 MG PO TABS: ORAL_TABLET | ORAL | 0 refills | Status: DC

## 2019-01-11 MED ORDER — DOXYCYCLINE HYCLATE 100 MG PO TABS: 100.0000 mg | ORAL_TABLET | Freq: Two times a day (BID) | ORAL | 0 refills | Status: AC

## 2019-01-11 MED ORDER — CLONAZEPAM 0.5 MG PO TABS: 0.2500 mg | ORAL_TABLET | Freq: Every day | ORAL | 0 refills | Status: DC | PRN

## 2019-01-11 MED ORDER — MAGNESIUM CITRATE PO SOLN
1.0000 | Freq: Once | ORAL | Status: AC
  Administered 2019-01-11: 1 via ORAL
  Filled 2019-01-11: qty 296

## 2019-01-11 MED ORDER — FLUTICASONE PROPIONATE 50 MCG/ACT NA SUSP: 2.0000 | Freq: Every day | NASAL | 2 refills | Status: DC

## 2019-01-11 MED ORDER — BUDESONIDE 0.5 MG/2ML IN SUSP: 0.5000 mg | Freq: Two times a day (BID) | RESPIRATORY_TRACT | 3 refills | Status: DC

## 2019-01-11 MED ORDER — CEFUROXIME AXETIL 500 MG PO TABS: 500.0000 mg | ORAL_TABLET | Freq: Two times a day (BID) | ORAL | 0 refills | Status: AC

## 2019-01-11 MED ORDER — PREDNISONE 20 MG PO TABS
60.0000 mg | ORAL_TABLET | Freq: Once | ORAL | Status: AC
  Administered 2019-01-11: 60 mg via ORAL
  Filled 2019-01-11: qty 3

## 2019-01-11 MED ORDER — DOXYCYCLINE HYCLATE 100 MG PO TABS
100.0000 mg | ORAL_TABLET | Freq: Two times a day (BID) | ORAL | Status: DC
  Administered 2019-01-11: 100 mg via ORAL
  Filled 2019-01-11: qty 1

## 2019-01-11 NOTE — Discharge Summary (Signed)
Physician Discharge Summary   Patient ID: Tammy Ball MRN: [id number] DOB/AGE: 1941-05-24 78 y.o.  Admit date: 01/07/2019 Discharge date: 01/11/2019  Primary Care Physician:  Tammy Moron, MD   Recommendations for Outpatient Follow-up:  1. Follow up with PCP in 1-2 weeks 2. Patient recommended to follow-up with pulmonology in 7 to 10 days for follow-up 3.  Discontinued Advair per pulmonology recommendations.  Placed on Pulmicort until patient is able to get approved for trelegy  Home Health: None Equipment/Devices:   Discharge Condition: stable CODE STATUS: FULL  Diet recommendation: Heart healthy diet   Discharge Diagnoses:    Acute on chronic respiratory failure with hypoxia due to COPD exacerbation, HCAP . HCAP (healthcare-associated pneumonia) . Essential hypertension . COPD GOLD II exacerbation . Renal cell cancer ([place]) . Depression . Hyperlipidemia . Morbid obesity ([place]) . Non-small cell carcinoma of right lung, stage 2 ([place]) . Pneumonitis, radiation ([place]) Anxiety   Consults: None    Allergies:   Allergies  Allergen Reactions  . Bee Venom Anaphylaxis, Shortness Of Breath, Swelling and Other (See Comments)    Swelling at site   . Amlodipine Swelling and Other (See Comments)    Swelling of the ankles and hands   . Levofloxacin Other (See Comments)    Joint pain   . Hctz [Hydrochlorothiazide] Palpitations and Other (See Comments)    Sweating      DISCHARGE MEDICATIONS: Allergies as of 01/11/2019      Reactions   Bee Venom Anaphylaxis, Shortness Of Breath, Swelling, Other (See Comments)   Swelling at site    Amlodipine Swelling, Other (See Comments)   Swelling of the ankles and hands    Levofloxacin Other (See Comments)   Joint pain    Hctz [hydrochlorothiazide] Palpitations, Other (See Comments)   Sweating       Medication List    STOP taking these medications   Fluticasone-Salmeterol 250-50 MCG/DOSE Aepb Commonly known as:  ADVAIR      TAKE these medications   acetaminophen 500 MG tablet Commonly known as:  TYLENOL Take 500 mg by mouth every 6 (six) hours as needed for mild pain, moderate pain, fever or headache. Reported on 05/24/2016   albuterol 108 (90 Base) MCG/ACT inhaler Commonly known as:  VENTOLIN HFA Inhale 1-2 puffs into the lungs every 6 (six) hours as needed for wheezing or shortness of breath.   albuterol (2.5 MG/3ML) 0.083% nebulizer solution Commonly known as:  PROVENTIL Take 3 mLs (2.5 mg total) by nebulization every 6 (six) hours as needed for wheezing or shortness of breath.   alendronate 70 MG tablet Commonly known as:  FOSAMAX Take 1 tablet (70 mg total) by mouth every 7 (seven) days. Take with a full glass of water on an empty stomach.   ARTIFICIAL TEARS OP Place 1 drop into both eyes daily as needed (dry eyes).   aspirin EC 81 MG tablet Take 81 mg by mouth daily. Reported on 04/11/2016   atorvastatin 40 MG tablet Commonly known as:  LIPITOR TAKE ONE TABLET BY MOUTH ONCE DAILY.   benzonatate 100 MG capsule Commonly known as:  TESSALON Take 1 capsule (100 mg total) by mouth 3 (three) times daily as needed for cough.   budesonide 0.5 MG/2ML nebulizer solution Commonly known as:  PULMICORT Take 2 mLs (0.5 mg total) by nebulization 2 (two) times daily.   calcium carbonate 750 MG chewable tablet Commonly known as:  TUMS EX Chew 1 tablet by mouth as needed for heartburn.  cefUROXime 500 MG tablet Commonly known as:  CEFTIN Take 1 tablet (500 mg total) by mouth 2 (two) times daily with a meal for 5 days.   clonazePAM 0.5 MG tablet Commonly known as:  KLONOPIN Take 0.5 tablets (0.25 mg total) by mouth daily as needed for anxiety.   doxycycline 100 MG tablet Commonly known as:  VIBRA-TABS Take 1 tablet (100 mg total) by mouth 2 (two) times daily for 5 days.   fish oil-omega-3 fatty acids 1000 MG capsule Take 1 capsule by mouth daily.   fluticasone 50 MCG/ACT nasal spray Commonly  known as:  FLONASE Place 2 sprays into both nostrils daily. What changed:  how much to take   guaiFENesin-dextromethorphan 100-10 MG/5ML syrup Commonly known as:  ROBITUSSIN DM Take 5 mLs by mouth every 4 (four) hours as needed for cough (chest congestion).   metoprolol succinate 50 MG 24 hr tablet Commonly known as:  TOPROL-XL Take 1 tablet (50 mg total) by mouth daily. Take with or immediately following a meal.   OXYGEN Inhale 2 L into the lungs continuous.   pantoprazole 40 MG tablet Commonly known as:  PROTONIX Take 1 tablet (40 mg total) by mouth daily at 6 (six) AM.   predniSONE 10 MG tablet Commonly known as:  DELTASONE Prednisone dosing: Take  Prednisone 40mg  (4 tabs) x 3 days, then taper to 30mg  (3 tabs) x 3 days, then 20mg  (2 tabs) x 3days, then 10mg  (1 tab) x 3days, then OFF.  Dispense:  30 tabs, refills: None Start taking on:  January 12, 2019   sodium chloride 0.65 % Soln nasal spray Commonly known as:  OCEAN Place 1 spray into both nostrils as needed for congestion.   tizanidine 2 MG capsule Commonly known as:  ZANAFLEX Take 1 capsule (2 mg total) by mouth 3 (three) times daily as needed for muscle spasms.   traMADol 50 MG tablet Commonly known as:  ULTRAM Take 1 tablet (50 mg total) by mouth every 6 (six) hours as needed (BACK PAIN).   traZODone 50 MG tablet Commonly known as:  DESYREL Take 0.5-1 tablets (25-50 mg total) by mouth at bedtime as needed for sleep.   vitamin C 1000 MG tablet Take 1,000 mg by mouth daily.   VITAMIN D3 PO Take 1,200 mg by mouth daily.        Brief H and P: For complete details please refer to admission H and P, but in brief Tammy Ball a 78 y.o.femalewith Past medical history ofCOPD, right lung cancer with left metastasis, renal cell cancer, HTN, HLD, recent admission for pneumonia.  Patient presented to the ED with coughing, change in her sputum culture from clear to dark yellow, wheezing. Patient was recently  hospitalized between 12/25/2018-12/30/2018. Seen by CCM.Treated with Ceftin and prednisone on discharge and recently seen PCP yesterday prior to admission. Patient reports that after her discharge from 01/04/2019 she started having increasing cough and shortness of breath. Chest x-ray showed new infiltrate in the left lower lobe, was admitted for further work-up.   Hospital Course:    HCAP (healthcare-associated pneumonia)/Streptococcus pneumonia -Patient presented with worsening cough, shortness of breath, wheezing.  Chest x-ray with new infiltrate in the left lower lobe -Plans are negative, HIV negative, urine strep antigen positive -Vancomycin dc'ed on 2/27, patient was continued IV cefepime -Given patient also had significant COPD exacerbation, placed on doxycycline and cefuroxime, prednisone taper  Acute on chronic hypoxic respiratory failure secondary to COPD exacerbation in the setting of HCAP  -  Patient was placed on scheduled nebs, IV Solu-Medrol, Pulmicort.   -Sinus x-rays negative for any sinusitis, air-fluid levels -Transition to oral prednisone, continue albuterol nebs, placed on Pulmicort nebs twice daily until patient is approved for trilogy as recommended by Dr. Lamonte Sakai outpatient.  She was recommended to stop Advair. -Significant component of anxiety, placed on low-dose Klonopin as needed  Essential hypertension -Overnight BP elevated, likely due to anxiety -Currently stable, continue Toprol-XL  GERD Continue PPI    Non-small cell carcinoma of right lung, stage 2 ([facility]) with history of right lobectomy of lung, status post stereotactic radiation, left lung metastasis -Recent CT scan of the chest done in 11/2018 showed band of pleural parenchymal thickening in the left lower lobe, will need repeat CT chest or PET scan outpatient for follow-up -Repeat CT chest reviewed showed progressive bandlike area of airspace consolidation and/or atelectasis in the left lower lobe,  favors sequelae of persistent chronic infection/pneumonia. -Outpatient follow-up with Dr. Julien Nordmann.  Hyperlipidemia -Continue statin  Anxiety Reports feeling better with low-dose Klonopin which also helps her COPD exacerbation with anxiety component.  Leukocytosis Secondary to steroids  Day of Discharge S: Feeling better, wants to go home, still has slight wheezing although much improved from admission, no fevers..  BP 137/75 (BP Location: Right Arm)   Pulse 79   Temp 98.2 F (36.8 C) (Oral)   Resp 18   Ht 5\' 3"  (1.6 m)   Wt 78 kg   SpO2 93%   BMI 30.46 kg/m   Physical Exam: General: Alert and awake oriented x3 not in any acute distress. HEENT: anicteric sclera, pupils reactive to light and accommodation CVS: S1-S2 clear no murmur rubs or gallops Chest: Mild scattered wheezing bilaterally, improved from admission Abdomen: soft nontender, nondistended, normal bowel sounds Extremities: no cyanosis, clubbing or edema noted bilaterally Neuro: Cranial nerves II-XII intact, no focal neurological deficits   The results of significant diagnostics from this hospitalization (including imaging, microbiology, ancillary and laboratory) are listed below for reference.      Procedures/Studies:  Dg Sinuses Complete  Result Date: 01/07/2019 CLINICAL DATA:  Recently discharged from hospital with pneumonia. History of lung cancer. Patient describes nasal congestion and postnasal drip. EXAM: PARANASAL SINUSES - COMPLETE 3 + VIEW COMPARISON:  None. FINDINGS: The paranasal sinus are aerated. There is no evidence of sinus opacification air-fluid levels or mucosal thickening. No significant bone abnormalities are seen. IMPRESSION: Negative. Electronically Signed   By: Franki Cabot M.D.   On: 01/07/2019 17:12   Dg Chest 2 View  Result Date: 01/07/2019 CLINICAL DATA:  Shortness of breath and productive cough. EXAM: CHEST - 2 VIEW COMPARISON:  12/29/2018 FINDINGS: The cardiac silhouette,  mediastinal and hilar contours are stable. Patchy left lower lobe airspace opacity consistent with pneumonia. No pleural effusion. The right lung is clear. IMPRESSION: Left lower lobe infiltrate. Electronically Signed   By: Marijo Sanes M.D.   On: 01/07/2019 13:41   Dg Chest 2 View  Result Date: 12/29/2018 CLINICAL DATA:  Shortness of breath. EXAM: CHEST - 2 VIEW COMPARISON:  Radiograph of December 25, 2018. FINDINGS: Stable cardiomediastinal silhouette. No pneumothorax or pleural effusion is noted. Both lungs are clear. Status post kyphoplasty at multiple levels of upper thoracic spine. IMPRESSION: No active cardiopulmonary disease. Electronically Signed   By: Marijo Conception, M.D.   On: 12/29/2018 15:02   Dg Chest 2 View  Result Date: 12/25/2018 CLINICAL DATA:  Patient here from home with complaints of SOB since last Tuesday. Lung  cancer patient. Reports that dr place her on portable O2 on Tuesday. Increase in cough. Productive. Hx COPD, HTN, renal cell carcinoma. EXAM: CHEST - 2 VIEW COMPARISON:  CT of the chest on 11/25/2018, chest x-ray on 02/19/2018 FINDINGS: Lungs are hyperinflated with. The heart size is normal. No pulmonary edema. No focal consolidations. Focal streaky opacity at the posterior aspect of the lung likely represents atelectasis or early infiltrates in both lung bases. There is mild perihilar peribronchial thickening. Remote UPPER thoracic vertebral augmentation. Remote cervical spine fusion. IMPRESSION: 1. Hyperinflation and bronchitic changes. 2. Bibasilar atelectasis or early infiltrates. Electronically Signed   By: Nolon Nations M.D.   On: 12/25/2018 13:43   Ct Chest W Contrast  Result Date: 01/09/2019 CLINICAL DATA:  Follow-up pneumonia. EXAM: CT CHEST WITH CONTRAST TECHNIQUE: Multidetector CT imaging of the chest was performed during intravenous contrast administration. CONTRAST:  20mL OMNIPAQUE IOHEXOL 300 MG/ML  SOLN COMPARISON:  CT chest 11/25/2018 FINDINGS:  Cardiovascular: The heart size appears within normal limits. No pericardial effusion. Aortic atherosclerosis. Left main, lad, left circumflex coronary artery calcifications. Mediastinum/Nodes: Normal appearance of the thyroid gland. The trachea appears patent and is midline. Normal appearance of the esophagus. No enlarged axillary, supraclavicular, mediastinal or hilar lymph nodes. Lungs/Pleura: Moderate changes of centrilobular emphysema with diffuse bronchial wall thickening. No pleural effusion, previous right upper lobectomy. Paramediastinal fibrosis within the right lung is again noted compatible with changes of external beam radiation. There are no findings in the right lung to suggest recurrence of disease. Within the left lower lobe there is a bandlike area of atelectasis which appears increased from the previous exam, image 143/7. No central obstructing mass identified. Upper Abdomen: No acute abnormality. Musculoskeletal: The bones are diffusely osteopenic. T3 and T4 compression fractures are stable status post vertebral augmentation. Unchanged superior endplate fracture involving T6. No new fractures. IMPRESSION: 1. Progressive bandlike area of airspace consolidation and/or atelectasis within the left lower lobe. This scratch set the appearance favors sequelae of persistent chronic infection/inflammation and/or aspiration. There are no specific findings highly specific for tumor. However, as the patient is at increased risk for lung cancer follow-up imaging is recommended to ensure stability and or resolution and to rule out underlying malignancy. Consider repeat examination within 3 months to assess for interval change. 2. Diffuse bronchial wall thickening with emphysema, as above; imaging findings suggestive of underlying COPD. 3. Aortic atherosclerosis and multi vessel coronary artery calcifications. Aortic Atherosclerosis (ICD10-I70.0) and Emphysema (ICD10-J43.9). Electronically Signed   By: Kerby Moors M.D.   On: 01/09/2019 14:36       LAB RESULTS: Basic Metabolic Panel: Recent Labs  Lab 01/10/19 0409 01/11/19 0515  NA 140 142  K 4.9 4.8  CL 106 106  CO2 29 30  GLUCOSE 120* 130*  BUN 27* 26*  CREATININE 0.98 0.98  CALCIUM 8.4* 8.6*   Liver Function Tests: Recent Labs  Lab 01/07/19 1259 01/08/19 0321  AST 19 15  ALT 20 20  ALKPHOS 65 52  BILITOT 1.3* 0.9  PROT 7.3 6.3*  ALBUMIN 3.9 3.1*   No results for input(s): LIPASE, AMYLASE in the last 168 hours. No results for input(s): AMMONIA in the last 168 hours. CBC: Recent Labs  Lab 01/08/19 0321  01/10/19 0409 01/11/19 0515  WBC 11.2*   < > 9.1 6.9  NEUTROABS 10.5*  --   --   --   HGB 11.0*   < > 10.4* 10.7*  HCT 34.7*   < > 33.3* 34.2*  MCV 98.3   < > 100.0 99.1  PLT 265   < > 245 231   < > = values in this interval not displayed.   Cardiac Enzymes: No results for input(s): CKTOTAL, CKMB, CKMBINDEX, TROPONINI in the last 168 hours. BNP: Invalid input(s): POCBNP CBG: No results for input(s): GLUCAP in the last 168 hours.    Disposition and Follow-up: Discharge Instructions    Diet - low sodium heart healthy   Complete by:  As directed    Discharge instructions   Complete by:  As directed    Please Stop Advair. Continue Pulmicort nebulizer twice a day instead.  Continue Albuterol Nebs 4 times a day for next 3 days, then taper to 3 times a day until follow-up Dr Lamonte Sakai.   Increase activity slowly   Complete by:  As directed        DISPOSITION: Home   DISCHARGE FOLLOW-UP Follow-up Information    Tammy Moron, MD Follow up on 01/12/2019.   Specialty:  Internal Medicine Contact information: [place] [address] [phone]        Collene Gobble, MD. Schedule an appointment as soon as possible for a visit in 10 day(s).   Specialty:  Pulmonary Disease Why:  for hospital follow-up Contact information: [place]  [address] [phone]            Time coordinating discharge:  35-minutes  Signed:   Estill Cotta M.D. [facility] 01/11/2019, 11:23 AM

## 2019-01-12 ENCOUNTER — Encounter: Payer: Self-pay | Admitting: Family Medicine

## 2019-01-12 ENCOUNTER — Ambulatory Visit (INDEPENDENT_AMBULATORY_CARE_PROVIDER_SITE_OTHER): Payer: Medicare Other | Admitting: Family Medicine

## 2019-01-12 ENCOUNTER — Other Ambulatory Visit: Payer: Self-pay

## 2019-01-12 VITALS — BP 160/90 | HR 96 | Temp 98.0°F | Resp 16 | Ht 63.0 in | Wt 170.0 lb

## 2019-01-12 DIAGNOSIS — F411 Generalized anxiety disorder: Secondary | ICD-10-CM

## 2019-01-12 DIAGNOSIS — R51 Headache: Secondary | ICD-10-CM | POA: Diagnosis not present

## 2019-01-12 DIAGNOSIS — R519 Headache, unspecified: Secondary | ICD-10-CM

## 2019-01-12 DIAGNOSIS — I10 Essential (primary) hypertension: Secondary | ICD-10-CM | POA: Diagnosis not present

## 2019-01-12 DIAGNOSIS — Z09 Encounter for follow-up examination after completed treatment for conditions other than malignant neoplasm: Secondary | ICD-10-CM

## 2019-01-12 DIAGNOSIS — J449 Chronic obstructive pulmonary disease, unspecified: Secondary | ICD-10-CM

## 2019-01-12 MED ORDER — ACETAMINOPHEN 325 MG PO TABS: 650.0000 mg | ORAL_TABLET | Freq: Four times a day (QID) | ORAL | Status: DC | PRN

## 2019-01-12 MED ORDER — ACETAMINOPHEN 500 MG PO TABS: 500.0000 mg | ORAL_TABLET | Freq: Once | ORAL | Status: DC

## 2019-01-12 NOTE — Patient Instructions (Signed)
° ° ° °  If you have lab work done today you will be contacted with your lab results within the next 2 weeks.  If you have not heard from us then please contact us. The fastest way to get your results is to register for My Chart. ° ° °IF you received an x-ray today, you will receive an invoice from [facility]. Please contact  Radiology at [phone] with questions or concerns regarding your invoice.  ° °IF you received labwork today, you will receive an invoice from [facility]. Please contact [facility] at [phone] with questions or concerns regarding your invoice.  ° °Our billing staff will not be able to assist you with questions regarding bills from these companies. ° °You will be contacted with the lab results as soon as they are available. The fastest way to get your results is to activate your My Chart account. Instructions are located on the last page of this paperwork. If you have not heard from us regarding the results in 2 weeks, please contact this office. °  ° ° ° °

## 2019-01-12 NOTE — Progress Notes (Signed)
Established Patient Office Visit  Subjective:  Patient ID: Tammy Ball, female    DOB: 1941/09/08  Age: 78 y.o. MRN: [id number]  CC:  Chief Complaint  Patient presents with  . Hospitalization Follow-up    pt was seen at ER on 2/26 for pnemonia     HPI Tammy Ball presents for Hospital Follow up   She reports that she has been in and out of the hospital for LLL pneumonia and COPD exacerbation She states that she has been having bouts of  She is now on 2L of oxygen She states that she is now having a headache and high blood pressure She states that prednisone causes her to get headaches and bp fluctuations In the hospital her bp was high as well She has been taking otc benadryl BP Readings from Last 3 Encounters:  01/12/19 (!) 160/90  01/11/19 (!) 175/99  01/06/19 138/68   Anxiety She states that she did not take any of the anxiety clonazepam 0.52m today  She states that she was started on that due to her anxiety about her breathing and her escalating blood pressure.  She reports that she is tapering down off the prednisone.   Headache She states that the headache is in the frontal area above the eyebrow She has blurry vision  She denies slurred speech She denies weakness or neck stiffness  She denies any current fevers or chills  COPD exacerbaiton and Respiratory Failure She reports that she has not had a copd exacerbation for a year She is currently doing pulmicort nebulizers and prednisone taper She states that the pulmicort required prior-authorization  She has not seen Dr. BMalvin Johnsas yet  Past Medical History:  Diagnosis Date  . Anemia    was while doing chemo  . Anxiety   . Asthma   . Chemotherapy-induced neuropathy ([facility] 11/01/2015  . Claustrophobia   . COPD (chronic obstructive pulmonary disease) ([facility]   . Depression   . Encounter for antineoplastic chemotherapy 02/09/2016  . Glaucoma   . Headache    prior to menopause  . Heart murmur   .  History of echocardiogram    Echo 2/17: EF 608-14% grade 1 diastolic dysfunction, mild MR, trivial pericardial effusion  . History of nuclear stress test    Myoview 2/17: no ischemia or scar, EF 79%; low risk  . History of radiation therapy 10/30/16-11/06/16  . Hyperlipidemia   . Hypertension   . Insomnia 05/16/2016  . Non-small cell carcinoma of right lung, stage 2 ([facility] 10/03/2015  . Radiation 02/29/16-04/10/16   50.4 Gy to right central chest  . Renal cell carcinoma ([facility]    L nephrectomy  in 2012    Past Surgical History:  Procedure Laterality Date  . BACK SURGERY     cervical 1991  . EYE SURGERY    . IR GENERIC HISTORICAL  01/16/2017   IR RADIOLOGIST EVAL & MGMT 01/16/2017 [facility]  . IR GENERIC HISTORICAL  01/31/2017   IR FLUORO GUIDED NEEDLE PLC ASPIRATION/INJECTION LOC 01/31/2017 SLuanne Bras MD [facility]  . IR GENERIC HISTORICAL  01/31/2017   IR VERTEBROPLASTY CERV/THOR BX INC UNI/BIL INC/INJECT/IMAGING 01/31/2017 SLuanne Bras MD [facility]  . IR GENERIC HISTORICAL  01/31/2017   IR VERTEBROPLASTY EA ADDL (T&LS) BX INC UNI/BIL INC INJECT/IMAGING 01/31/2017 SLuanne Bras MD [facility]  . kidney cancer    . NEPHRECTOMY    . SPINE SURGERY    . TUBAL LIGATION    . VIDEO ASSISTED  THORACOSCOPY (VATS)/WEDGE RESECTION Right 01/18/2016   Procedure: VIDEO ASSISTED THORACOSCOPY (VATS)/LUNG RESECTION, THOROCOTOMY, RIGHT UPPER LOBECTOMY, LYMPH NODE DISSECTION, PLACEMENT OF ON Q;  Surgeon: [name], MD;  Location: [facility];  Service: Thoracic;  Laterality: Right;  Marland Kitchen VIDEO BRONCHOSCOPY Bilateral 09/20/2015   Procedure: VIDEO BRONCHOSCOPY WITHOUT FLUORO;  Surgeon: Rigoberto Noel, MD;  Location: [facility];  Service: Cardiopulmonary;  Laterality: Bilateral;  . VIDEO BRONCHOSCOPY N/A 01/18/2016   Procedure: VIDEO BRONCHOSCOPY;  Surgeon: [name], MD;  Location: [facility] OR;  Service: Thoracic;  Laterality: N/A;    Family History  Problem Relation Age of Onset  .  Heart disease Sister   . Obesity Brother   . Heart attack Daughter 65       s/p CABG  . Glaucoma Daughter   . Breast cancer Sister   . Birth defects Sister   . Cancer Mother        Bladder Cancer  . Hypertension Mother   . CAD Mother 53  . Cancer Maternal Grandmother     Social History   Socioeconomic History  . Marital status: Widowed    Spouse name: Not on file  . Number of children: 4  . Years of education: Not on file  . Highest education level: High school graduate  Occupational History  . Not on file  Social Needs  . Financial resource strain: Not hard at all  . Food insecurity:    Worry: Never true    Inability: Never true  . Transportation needs:    Medical: No    Non-medical: No  Tobacco Use  . Smoking status: Former Smoker    Packs/day: 1.00    Years: 50.00    Pack years: 50.00    Types: Cigarettes    Last attempt to quit: 03/16/2011    Years since quitting: 7.8  . Smokeless tobacco: Never Used  . Tobacco comment: last 4-5 years of smoking, smoked 0.5 pack/day   Substance and Sexual Activity  . Alcohol use: No    Alcohol/week: 0.0 standard drinks  . Drug use: No  . Sexual activity: Not Currently  Lifestyle  . Physical activity:    Days per week: 0 days    Minutes per session: 0 min  . Stress: Only a little  Relationships  . Social connections:    Talks on phone: More than three times a week    Gets together: More than three times a week    Attends religious service: More than 4 times per year    Active member of club or organization: Yes    Attends meetings of clubs or organizations: More than 4 times per year    Relationship status: Widowed  . Intimate partner violence:    Fear of current or ex partner: No    Emotionally abused: No    Physically abused: No    Forced sexual activity: No  Other Topics Concern  . Not on file  Social History Narrative   Marital status: divorced; not dating in 2019.      Children: 4 children; 3 grandchildren  adult; 4 gg.      Lives: alone in house      Employment: part-time work; substance abuse counselor; [employer].      Tobacco: quit smoking 2012; smoked 45 years      Alcohol: none      Drugs: none      Exercise: none in 2019; due to LEFT sciatica.  ADLs: independent with ADLs; drives.       Advanced Directives: YES: HCPOA: Nicholas Martinez/son.  FULL CODE but no prolonged measures.      Occupation: Substance Abuse Estate agent   No exercise** Merged History Encounter **       ** Data from: 12/14/11 Enc Dept: [facility]       ** Data from: 12/17/11 Enc Dept: [facility]   Substance abuse counselor   Husband deceased   4 great grandchildren   Son works in same substance abuse counseling center as patient    Outpatient Medications Prior to Visit  Medication Sig Dispense Refill  . acetaminophen (TYLENOL) 500 MG tablet Take 500 mg by mouth every 6 (six) hours as needed for mild pain, moderate pain, fever or headache. Reported on 05/24/2016    . albuterol (PROVENTIL) (2.5 MG/3ML) 0.083% nebulizer solution Take 3 mLs (2.5 mg total) by nebulization every 6 (six) hours as needed for wheezing or shortness of breath. 75 mL 12  . albuterol (VENTOLIN HFA) 108 (90 Base) MCG/ACT inhaler Inhale 1-2 puffs into the lungs every 6 (six) hours as needed for wheezing or shortness of breath. 1 Inhaler 6  . Ascorbic Acid (VITAMIN C) 1000 MG tablet Take 1,000 mg by mouth daily.    Marland Kitchen aspirin EC 81 MG tablet Take 81 mg by mouth daily. Reported on 04/11/2016    . atorvastatin (LIPITOR) 40 MG tablet TAKE ONE TABLET BY MOUTH ONCE DAILY. 90 tablet 1  . benzonatate (TESSALON) 100 MG capsule Take 1 capsule (100 mg total) by mouth 3 (three) times daily as needed for cough. 20 capsule 0  . budesonide (PULMICORT) 0.5 MG/2ML nebulizer solution Take 2 mLs (0.5 mg total) by nebulization 2 (two) times daily. 60 mL 3  . calcium carbonate (TUMS EX) 750 MG chewable tablet  Chew 1 tablet by mouth as needed for heartburn.    . Carboxymethylcellulose Sodium (ARTIFICIAL TEARS OP) Place 1 drop into both eyes daily as needed (dry eyes).    . cefUROXime (CEFTIN) 500 MG tablet Take 1 tablet (500 mg total) by mouth 2 (two) times daily with a meal for 5 days. 10 tablet 0  . Cholecalciferol (VITAMIN D3 PO) Take 1,200 mg by mouth daily.    . clonazePAM (KLONOPIN) 0.5 MG tablet Take 0.5 tablets (0.25 mg total) by mouth daily as needed for anxiety. 30 tablet 0  . doxycycline (VIBRA-TABS) 100 MG tablet Take 1 tablet (100 mg total) by mouth 2 (two) times daily for 5 days. 10 tablet 0  . fish oil-omega-3 fatty acids 1000 MG capsule Take 1 capsule by mouth daily.     . fluticasone (FLONASE) 50 MCG/ACT nasal spray Place 2 sprays into both nostrils daily. 16 g 2  . guaiFENesin-dextromethorphan (ROBITUSSIN DM) 100-10 MG/5ML syrup Take 5 mLs by mouth every 4 (four) hours as needed for cough (chest congestion). 118 mL 0  . metoprolol succinate (TOPROL-XL) 50 MG 24 hr tablet Take 1 tablet (50 mg total) by mouth daily. Take with or immediately following a meal. 90 tablet 1  . OXYGEN Inhale 2 L into the lungs continuous.    . pantoprazole (PROTONIX) 40 MG tablet Take 1 tablet (40 mg total) by mouth daily at 6 (six) AM. 30 tablet 3  . predniSONE (DELTASONE) 10 MG tablet Prednisone dosing: Take  Prednisone '40mg'$  (4 tabs) x 3 days, then taper to '30mg'$  (3 tabs) x 3 days, then  28m (2 tabs) x 3days, then 165m(1 tab) x 3days, then OFF.  Dispense:  30 tabs, refills: None 30 tablet 0  . sodium chloride (OCEAN) 0.65 % SOLN nasal spray Place 1 spray into both nostrils as needed for congestion.    . tizanidine (ZANAFLEX) 2 MG capsule Take 1 capsule (2 mg total) by mouth 3 (three) times daily as needed for muscle spasms. 90 capsule 1  . traMADol (ULTRAM) 50 MG tablet Take 1 tablet (50 mg total) by mouth every 6 (six) hours as needed (BACK PAIN). 90 tablet 0  . traZODone (DESYREL) 50 MG tablet Take 0.5-1  tablets (25-50 mg total) by mouth at bedtime as needed for sleep. 90 tablet 3  . alendronate (FOSAMAX) 70 MG tablet Take 1 tablet (70 mg total) by mouth every 7 (seven) days. Take with a full glass of water on an empty stomach. (Patient not taking: Reported on 01/12/2019) 12 tablet 3   No facility-administered medications prior to visit.     Allergies  Allergen Reactions  . Bee Venom Anaphylaxis, Shortness Of Breath, Swelling and Other (See Comments)    Swelling at site   . Amlodipine Swelling and Other (See Comments)    Swelling of the ankles and hands   . Levofloxacin Other (See Comments)    Joint pain   . Hctz [Hydrochlorothiazide] Palpitations and Other (See Comments)    Sweating     ROS Review of Systems Review of Systems  Constitutional: Negative for activity change, appetite change, chills and fever.  HENT: Negative for congestion, nosebleeds, trouble swallowing and voice change.   Respiratory: see hpi  Gastrointestinal: Negative for diarrhea, nausea and vomiting.  Genitourinary: Negative for difficulty urinating, dysuria, flank pain and hematuria.  Musculoskeletal: Negative for back pain, joint swelling and neck pain.  Neurological: Negative for dizziness, speech difficulty, light-headedness and numbness.  See HPI. All other review of systems negative.     Objective:    Physical Exam  BP (!) 160/90   Pulse 96   Temp 98 F (36.7 C) (Oral)   Resp 16   Ht _0  (1.6 m)   Wt 170 lb (77.1 kg)   SpO2 93%   BMI 30.11 kg/m  Wt Readings from Last 3 Encounters:  01/12/19 170 lb (77.1 kg)  01/07/19 171 lb 15.3 oz (78 kg)  01/06/19 173 lb 12.8 oz (78.8 kg)    Physical Exam  Constitutional: Oriented to person, place, and time. Appears well-developed and well-nourished.  HENT:  Head: Normocephalic and atraumatic.  Eyes: Conjunctivae and EOM are normal.  Cardiovascular: Normal rate, regular rhythm, normal heart sounds and intact distal pulses.  No murmur  heard. Pulmonary/Chest: Effort normal, wearing 2L [place] oxygen. Coarse breath sounds and wheezing diffusely, no crackles, no retractions Neurological: Is alert and oriented to person, place, and time.  Skin: Skin is warm. Capillary refill takes less than 2 seconds.  Psychiatric: Has a normal mood and affect. Behavior is normal. Judgment and thought content normal.    There are no preventive care reminders to display for this patient.  There are no preventive care reminders to display for this patient.  Lab Results  Component Value Date   TSH 1.320 11/21/2018   Lab Results  Component Value Date   WBC 6.9 01/11/2019   HGB 10.7 (L) 01/11/2019   HCT 34.2 (L) 01/11/2019   MCV 99.1 01/11/2019   PLT 231 01/11/2019   Lab Results  Component Value Date   NA 142 01/11/2019  K 4.8 01/11/2019   CHLORIDE 103 11/11/2017   CO2 30 01/11/2019   GLUCOSE 130 (H) 01/11/2019   BUN 26 (H) 01/11/2019   CREATININE 0.98 01/11/2019   BILITOT 0.9 01/08/2019   ALKPHOS 52 01/08/2019   AST 15 01/08/2019   ALT 20 01/08/2019   PROT 6.3 (L) 01/08/2019   ALBUMIN 3.1 (L) 01/08/2019   CALCIUM 8.6 (L) 01/11/2019   ANIONGAP 6 01/11/2019   EGFR 40 (L) 11/11/2017   Lab Results  Component Value Date   CHOL 163 11/21/2018   Lab Results  Component Value Date   HDL 87 11/21/2018   Lab Results  Component Value Date   LDLCALC 61 11/21/2018   Lab Results  Component Value Date   TRIG 73 11/21/2018   Lab Results  Component Value Date   CHOLHDL 1.9 11/21/2018   No results found for: HGBA1C    Assessment & Plan:   Problem List Items Addressed This Visit      Respiratory   COPD GOLD II     Other Visit Diagnoses    Acute nonintractable headache, unspecified headache type    -  Primary   Relevant Medications   acetaminophen (TYLENOL) tablet 500 mg   Hospital discharge follow-up       Uncontrolled hypertension       Anxiety state         [facility] follow up  This is  a 78 year old female with history of COPD and hypertension who was recently discharged from the hospital on January 11, 2019 with diagnosis of acute on chronic respiratory failure on 2 L of oxygen, hypertension, anxiety, COPD exacerbation.  Based on today's blood pressures and symptoms it seems as though the prednisone is helping her respiratory function but is impacting her blood pressures.  She should continue the prednisone and monitor her blood pressures often.  Advised patient to take 1/4-1/2 of her clonazepam as benzodiazepines can lead to some dependence and in her age and condition this provider will not be refilling her clonazepam.  Patient was also instructed to follow-up very closely with her pulmonologist and return to clinic in 1 month here for blood pressure recheck after completing her prednisone.    Headache Her headache symptoms did not seem to be related to her blood pressures as the patient endorses that the prednisone seems to give her headaches all the time.  Gave patient a dose of Tylenol 500 mg with instructions that if her headache persists she should go to the emergency department or the urgent care at [facility].   A total of 45 minutes were spent face-to-face with the patient during this encounter and over half of that time was spent on counseling and coordination of care. Reviewing hospital records, messaging her pulmonologist and also discussing the pathophysiology of COPD and agreeing that is she has worsening symptoms it is best to go to the Urgent Care at [facility] than to wait for an appointment to open up.   Meds ordered this encounter  Medications  . DISCONTD: acetaminophen (TYLENOL) tablet 650 mg  . acetaminophen (TYLENOL) tablet 500 mg    Follow-up: Return in about 4 weeks (around 02/09/2019) for hypertension follow up .    Forrest Moron, MD

## 2019-01-13 LAB — EXPECTORATED SPUTUM ASSESSMENT W GRAM STAIN, RFLX TO RESP C

## 2019-01-13 LAB — EXPECTORATED SPUTUM ASSESSMENT W REFEX TO RESP CULTURE

## 2019-01-22 ENCOUNTER — Ambulatory Visit (INDEPENDENT_AMBULATORY_CARE_PROVIDER_SITE_OTHER): Payer: Medicare Other | Admitting: Emergency Medicine

## 2019-01-22 ENCOUNTER — Encounter: Payer: Self-pay | Admitting: Emergency Medicine

## 2019-01-22 ENCOUNTER — Other Ambulatory Visit: Payer: Self-pay

## 2019-01-22 MED ORDER — FLUTICASONE-UMECLIDIN-VILANT 100-62.5-25 MCG/INH IN AEPB: 1.0000 | INHALATION_SPRAY | Freq: Every day | RESPIRATORY_TRACT | 0 refills | Status: DC

## 2019-01-29 ENCOUNTER — Other Ambulatory Visit: Payer: Self-pay | Admitting: Family Medicine

## 2019-01-29 NOTE — Telephone Encounter (Signed)
Requested medication (s) are due for refill today: yes  Requested medication (s) are on the active medication list: yes    Last refill: 11/21/2018 #90 1 refill  Future visit scheduled yes 02/19/2019 Dr. Nolon Rod  Notes to clinic:not delegated  Requested Prescriptions  Pending Prescriptions Disp Refills   tiZANidine (ZANAFLEX) 2 MG tablet [Pharmacy Med Name: TIZANIDINE HCL 2 MG TABLET] 90 tablet 1    Sig: TAKE 1 TABLET BY MOUTH THREE TIMES A DAY AS NEEDED SPASMS     Not Delegated - Cardiovascular:  Alpha-2 Agonists - tizanidine Failed - 01/29/2019  2:19 AM      Failed - This refill cannot be delegated      Passed - Valid encounter within last 6 months    Recent Outpatient Visits          2 weeks ago Acute nonintractable headache, unspecified headache type   Primary Care at [facility], Arlie Solomons, MD   1 month ago Shortness of breath   Primary Care at [facility], Ellie Lunch, FNP   2 months ago Medicare annual wellness visit, subsequent   Primary Care at [place], MD   2 months ago Essential hypertension   Primary Care at [facility], Arlie Solomons, MD   5 months ago Encounter for immunization   Primary Care at [facility], Ranell Patrick, MD      Future Appointments            In 2 weeks Byrum, Rose Fillers, MD [facility]   In 3 weeks Forrest Moron, MD Primary Care at [place], [facility]   In 3 months Forrest Moron, MD Primary Care at [place], [facility]

## 2019-01-31 ENCOUNTER — Telehealth: Payer: Self-pay | Admitting: Pulmonary Disease

## 2019-01-31 MED ORDER — CLARITHROMYCIN 250 MG PO TABS: 250.0000 mg | ORAL_TABLET | Freq: Two times a day (BID) | ORAL | 0 refills | Status: AC

## 2019-01-31 NOTE — Telephone Encounter (Signed)
Patient called into answering service with temperature of 99.4  Concerned that you may be coming down with an infection Stated she was told to call if temp was in the 99's  Called in Biaxin 250 twice daily for 7 days

## 2019-02-02 ENCOUNTER — Telehealth: Payer: Self-pay | Admitting: Emergency Medicine

## 2019-02-02 NOTE — Telephone Encounter (Signed)
Called and spoke with pt who states she has running a temp of 99.4 which she has been running this around 3-4 days now. Pt states today she has not been running a fever but has been taking clarithromycin since 3/21.  Pt also has had some coughing and wheezing but states that has not been happening last night or this morning. Pt denies any complaints of chest tightness but does become SOB with exertion.  After further speaking with pt, pt stated to me she had called office after hours 3/21 and the on call MD is the one who prescribed the abx for her. I stated to pt to continue taking the abx and to call us after she is finished with the abx if she is still not better. Pt expressed understanding. nothing further needed.

## 2019-02-06 IMAGING — MR MR THORACIC SPINE WO/W CM
4 of 10 series · 19 of 48 positions shown · IV contrast (agent unspecified)
Comparison: Thoracic CT 10/25/2016

Thoracic spine radiograph 12/08/2016

CLINICAL DATA: Compression fracture.  Evaluation for metastasis.

EXAM:
MRI THORACIC SPINE WITH CONTRAST
TECHNIQUE: Multiplanar, multisequence MR imaging of the thoracic spine was
performed following the administration of intravenous contrast.

[Series 5: T1 · sagittal · 3.0mm · 0.62mm/px · 3 of 13 slices shown]
[im 1/13]
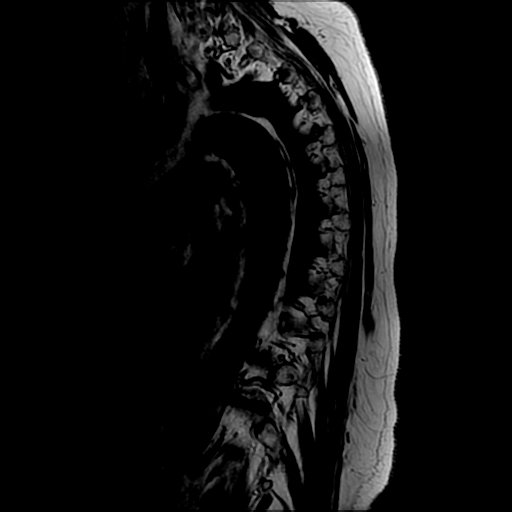
[im 7/13]
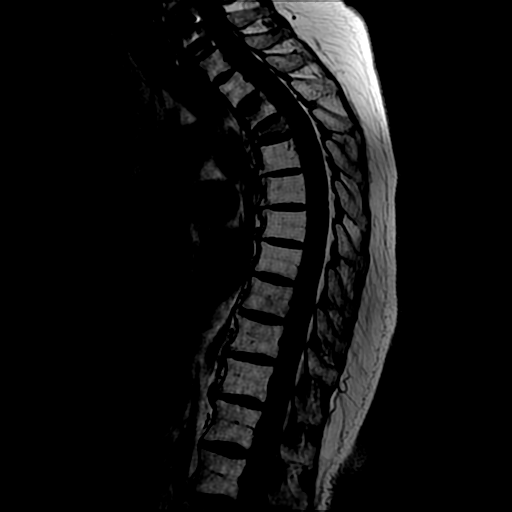
[im 13/13]
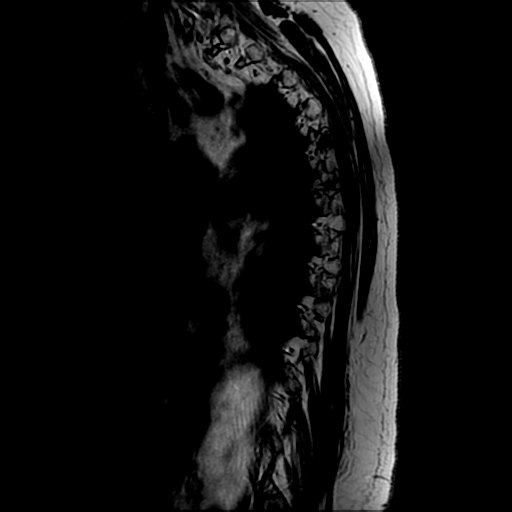

[Series 7: T2 · axial · 4.0mm · 0.39mm/px · z∈[-231,-39]mm · 7 of 40 slices shown (1 of 2)]
[im 1/40]
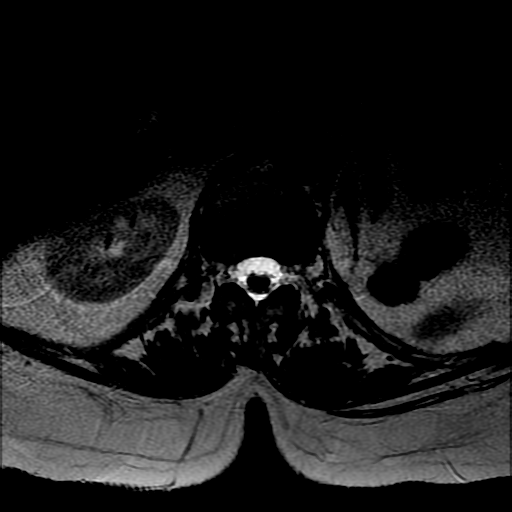
[im 7/40]
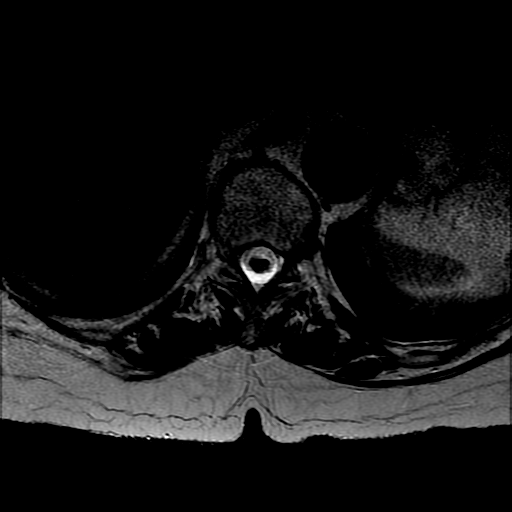
[im 14/40]
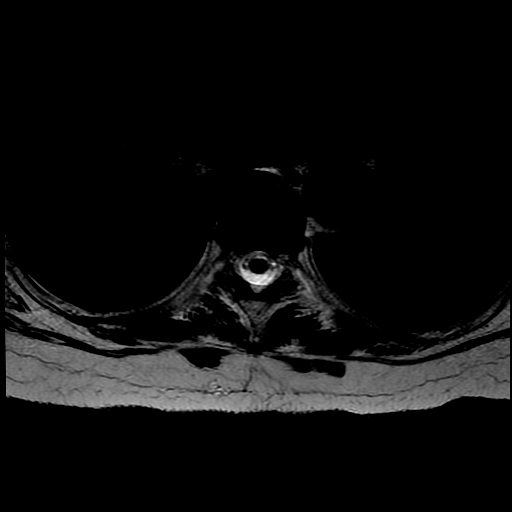
[im 20/40]
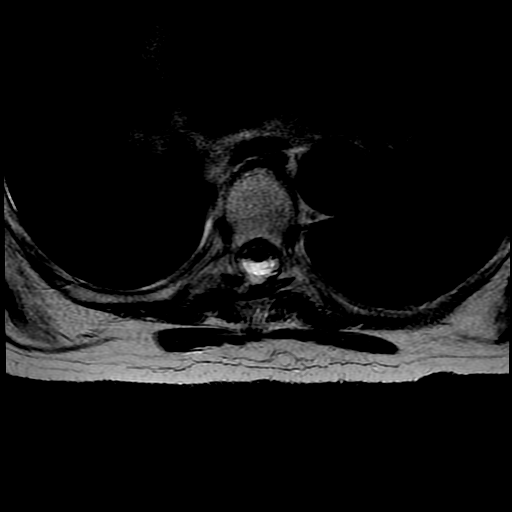
[im 27/40]
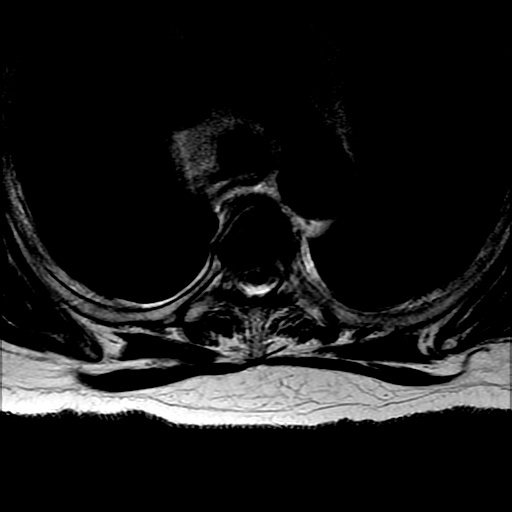
[im 33/40]
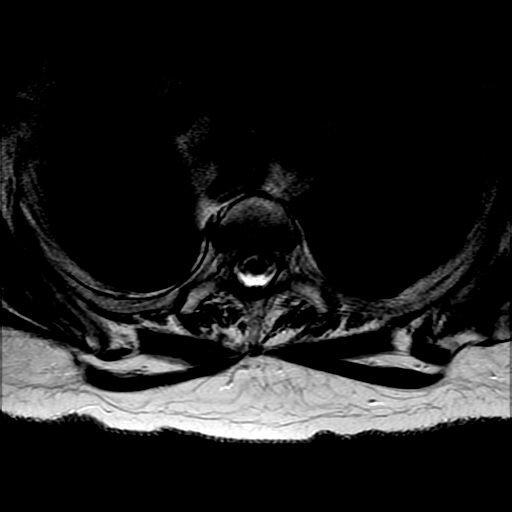
[im 40/40]
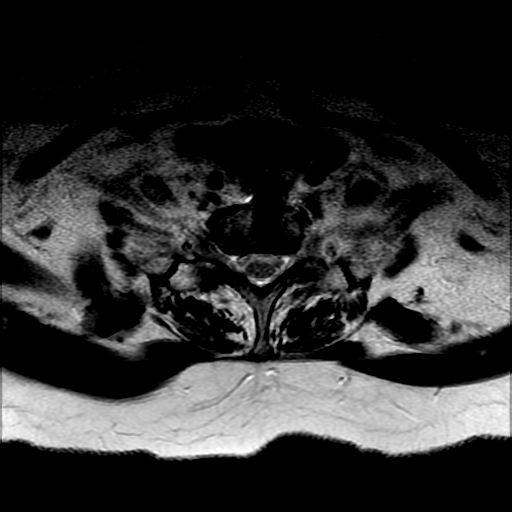

[Series 8: T2 · axial · 4.0mm · 0.39mm/px · z∈[-231,-39]mm · 7 of 40 slices shown (2 of 2)]
[im 1/40]
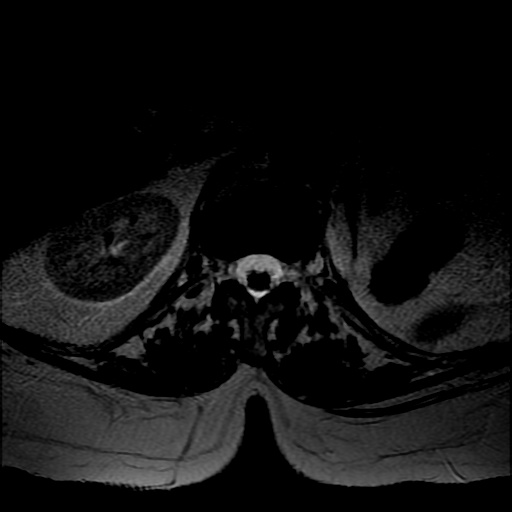
[im 7/40]
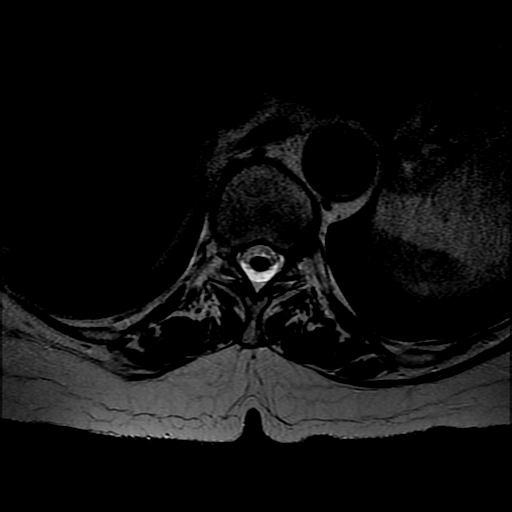
[im 14/40]
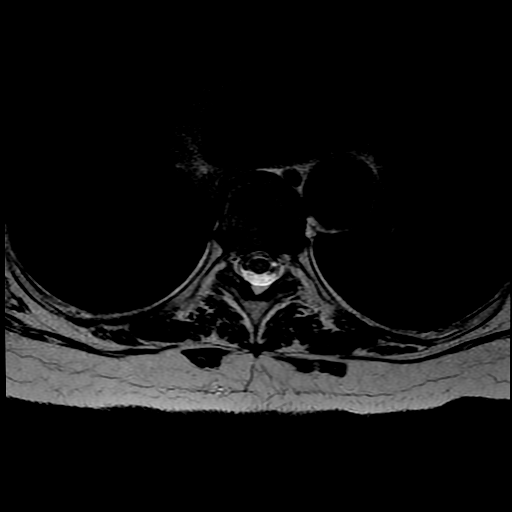
[im 20/40]
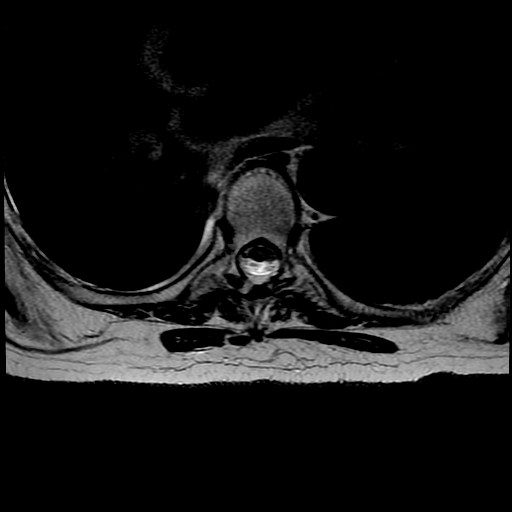
[im 27/40]
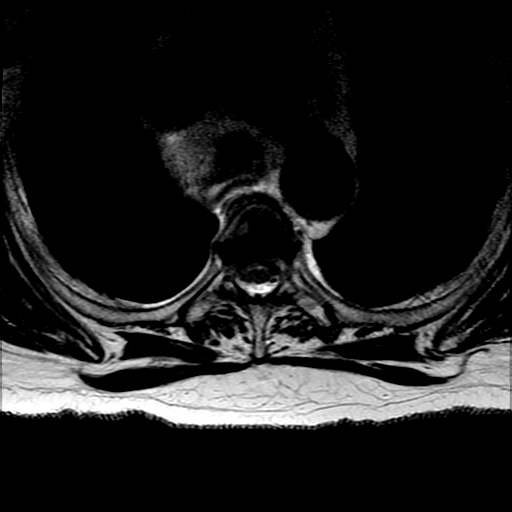
[im 33/40]
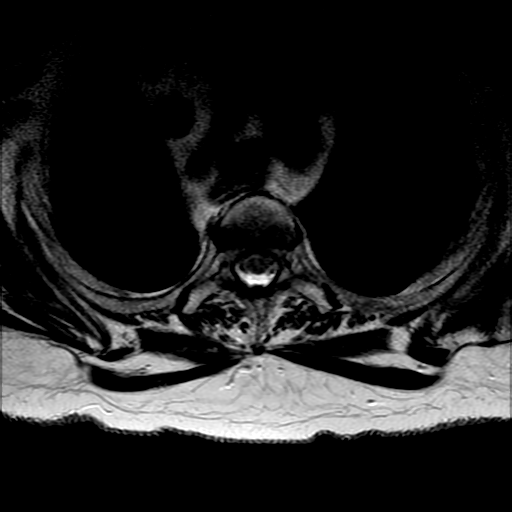
[im 40/40]
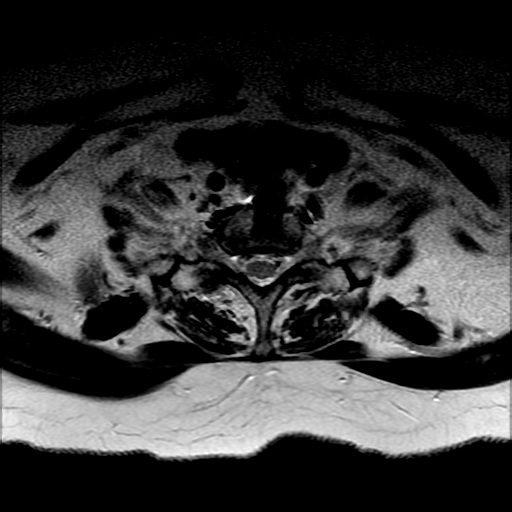

[Series 11: T2 post-contrast · sagittal · 3.0mm · 0.62mm/px · 2 of 13 slices shown]
[im 1/13]
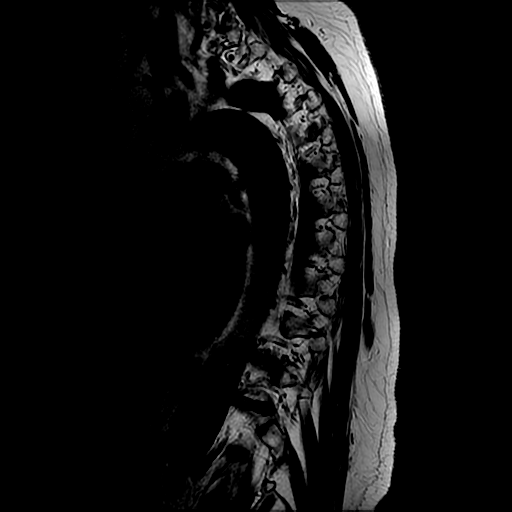
[im 13/13]
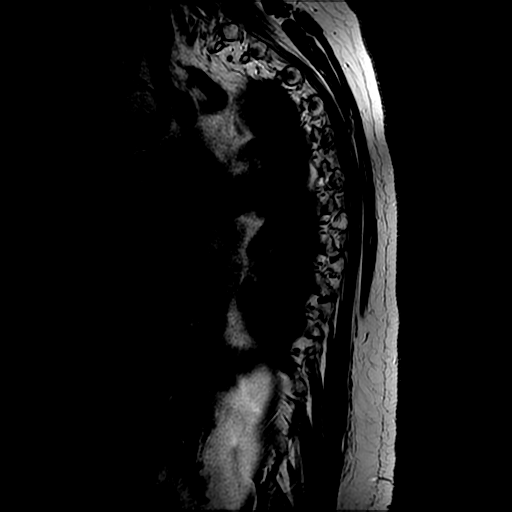

[19 of 48 positions shown; findings below may reference images not displayed]

FINDINGS: Alignment:  Normal

Vertebrae: There are compression fractures at T3 and T4. There is
mild edema of both T3 endplates. There is an area of edema within
the left posterior aspect of the T4 body. No other compression
fracture.

At the T4 and T5 levels, there are areas of low T1 weighted signal
at the junction of the vertebral body and left pedicle that show
contrast enhancement after gadolinium administration. There is mild
contrast enhancement of the superior endplate of T3. No focal signal
abnormality or contrast enhancement of the thoracic vertebrae below
the T5 level.

Cord: The spinal cord has normal signal and caliber. There is no
focal epidural abnormality.

Paraspinal and other soft tissues: Known pulmonary abnormalities are
poorly characterized on this study. Visualized paraspinal soft
tissues are otherwise unremarkable.

Disc levels:

There is no thoracic spinal canal or neural foraminal stenosis.
IMPRESSION: 1. Focal osseous lesions at the junction of the vertebral body and
left pedicle at the T4 and T5 levels. The signal characteristics,
enhancement pattern and location are all suggestive of osseous
metastatic disease.
2. Compression fractures at T3 and T4 with approximately 25% and 50%
height loss, respectively. No significant retropulsion.
3. Contrast enhancement of the T3 superior endp[REDACTED] be secondary
to recent fracture or worsening of fracture and is not specific for
metastatic disease. Attention recommended on follow-up.

## 2019-02-11 NOTE — Progress Notes (Signed)
  Virtual Visit via Telephone Note  I connected with Tammy Ball on 02/11/19 at  9:00 AM EDT by telephone and verified that I am speaking with the correct person using two identifiers.   I discussed the limitations, risks, security and privacy concerns of performing an evaluation and management service by telephone and the availability of in person appointments. I also discussed with the patient that there may be a patient responsible charge related to this service. The patient expressed understanding and agreed to proceed.  Patient at home, agreeing to [employer]. Myself and Pauline Pegues present on phone call. My nurse lisa to set up follow-up and mail AVS.  History of Present Illness: 78 year old female, former smoker quit in 2012 (50 pack year hx). PMH significant for hypertension, depression, hyperlipidemia, obesity, COPD GOLD II, chronic respiratory failure with hypoxia, allergic rhinitis, non-small cell carcinoma right lung s/p lobectomy, radiation pneumonitis. Patient of Dr. Lamonte Sakai, last seen on 01/22/19 for hospital follow-up. Advair discontinued. Appears she was given Trelegy inhaler.   Recent hospitalization from 2/26-3/1 for streptococcus pneumonia and COPD exacerbation. CXR showed new LLL infiltrate. Treated with IV vanco/cefepime, doxycyline, cefuroxime and prednisone taper. BP was elevated on discharge, felt to be related to anxiety. Continues Toprolol XL. Given low-dose prescription for Klonopin as needed.   Called office on 3/21 with reports of temperature of 99.4, concerned she may be coming down with an infection. She spoke with on call provider, Dr. Ander Slade, and was prescribed Biaxin 250mg  BID x 1 week.    02/12/2019 Called patient today for 1 month follow-up visit from Dr. Sudie Bailey last office visit. States that she is doing fine. Completed antibiotic and has been afebrile for the last three days. She reports benefit from addition of Trelegy and would like to stay on inhaler if possible.  She was given samples but will run out in 1 week. Continues wearing 2L oxygen on exertion, does not need it at rest. Needs pulmonary function test at next visit. She also needs PA processed for Trelegy. She has tried Advair, symbicort, pulmicort and spiriva in the past.    Observations/Objective:  - Reports O2 95% RA while sitting  - No shortness of breath, wheezing or cough noted during phone conversation  Assessment and Plan:  COPD with emphysema  - Continue Trelegy 1 puff daily (sending in RX/ initiating paper work for [employer])  - Albuterol hfa/neb q 6 hours prn sob or wheezing  - Needs pulmonary function testing prior to next follow up  Chronic respiratory failure - Continue 2L on exertion - O2 95% RA at rest  Pneumonia - Clinically improved  - Needs repeat CT w/contrast in 3 months to follow up on consolidation LLL  Follow Up Instructions:  - 3-4 months OV with Dr. Lamonte Sakai  - PFTs prior to next visit   I discussed the assessment and treatment plan with the patient. The patient was provided an opportunity to ask questions and all were answered. The patient agreed with the plan and demonstrated an understanding of the instructions.   The patient was advised to call back or seek an in-person evaluation if the symptoms worsen or if the condition fails to improve as anticipated.  I provided 22 minutes of non-face-to-face time during this encounter.   Martyn Ehrich, NP

## 2019-02-12 ENCOUNTER — Other Ambulatory Visit: Payer: Self-pay

## 2019-02-12 ENCOUNTER — Telehealth: Payer: Self-pay

## 2019-02-12 ENCOUNTER — Ambulatory Visit (INDEPENDENT_AMBULATORY_CARE_PROVIDER_SITE_OTHER): Payer: Medicare Other | Admitting: Primary Care

## 2019-02-12 ENCOUNTER — Encounter: Payer: Self-pay | Admitting: Primary Care

## 2019-02-12 ENCOUNTER — Ambulatory Visit: Payer: Medicare Other | Admitting: Emergency Medicine

## 2019-02-12 DIAGNOSIS — J181 Lobar pneumonia, unspecified organism: Secondary | ICD-10-CM

## 2019-02-12 DIAGNOSIS — J438 Other emphysema: Secondary | ICD-10-CM

## 2019-02-12 DIAGNOSIS — J9611 Chronic respiratory failure with hypoxia: Secondary | ICD-10-CM | POA: Diagnosis not present

## 2019-02-12 DIAGNOSIS — J449 Chronic obstructive pulmonary disease, unspecified: Secondary | ICD-10-CM

## 2019-02-12 MED ORDER — FLUTICASONE-UMECLIDIN-VILANT 100-62.5-25 MCG/INH IN AEPB: 1.0000 | INHALATION_SPRAY | Freq: Every day | RESPIRATORY_TRACT | 3 refills | Status: DC

## 2019-02-12 NOTE — Telephone Encounter (Signed)
Spoke with pt.  trelegy is going to cost $225 per pharmacy.  intructed pt to go to [email or web address] to fill out a copay assist form.  Told pt I would call back tomorrow to check to see if trelegy can assist her.

## 2019-02-12 NOTE — Patient Instructions (Signed)
  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 Angelica Chessman PFTs

## 2019-02-12 NOTE — Telephone Encounter (Signed)
Spoke with pt. Will monitor for paperwork, fill it out and send back ASAP.  Will call pt back when completed.

## 2019-02-13 ENCOUNTER — Other Ambulatory Visit: Payer: Self-pay

## 2019-02-13 DIAGNOSIS — J449 Chronic obstructive pulmonary disease, unspecified: Secondary | ICD-10-CM

## 2019-02-13 DIAGNOSIS — R0602 Shortness of breath: Secondary | ICD-10-CM

## 2019-02-13 MED ORDER — ALBUTEROL SULFATE HFA 108 (90 BASE) MCG/ACT IN AERS: 1.0000 | INHALATION_SPRAY | Freq: Four times a day (QID) | RESPIRATORY_TRACT | 6 refills | Status: DC | PRN

## 2019-02-13 NOTE — Telephone Encounter (Signed)
PT WAS WANTING A REFILL ON ADVAIR INHALER THAT HAD BEEN PRESCRIBED BY DR .Tamala Julian  PT HAS REFILLS BUT PHARMACY TOLD PT THAT PRESCRIPTION HAD BEEN CANCELLED. PT ONLY HAS ENOUGH TO LAST 6 MORE DAYS. CAN WE REFILL? PLEASE CALL PT AND ADVISE fr

## 2019-02-13 NOTE — Telephone Encounter (Signed)
RX sent in

## 2019-02-19 ENCOUNTER — Other Ambulatory Visit: Payer: Self-pay

## 2019-02-19 ENCOUNTER — Telehealth (INDEPENDENT_AMBULATORY_CARE_PROVIDER_SITE_OTHER): Payer: Medicare Other | Admitting: Family Medicine

## 2019-02-19 ENCOUNTER — Telehealth: Payer: Self-pay | Admitting: Emergency Medicine

## 2019-02-19 ENCOUNTER — Encounter: Payer: Self-pay | Admitting: Family Medicine

## 2019-02-19 VITALS — BP 126/65 | HR 91

## 2019-02-19 DIAGNOSIS — J9611 Chronic respiratory failure with hypoxia: Secondary | ICD-10-CM

## 2019-02-19 DIAGNOSIS — I10 Essential (primary) hypertension: Secondary | ICD-10-CM

## 2019-02-19 DIAGNOSIS — F411 Generalized anxiety disorder: Secondary | ICD-10-CM | POA: Diagnosis not present

## 2019-02-19 DIAGNOSIS — M5432 Sciatica, left side: Secondary | ICD-10-CM | POA: Diagnosis not present

## 2019-02-19 MED ORDER — GABAPENTIN 300 MG PO CAPS: 300.0000 mg | ORAL_CAPSULE | Freq: Every day | ORAL | 1 refills | Status: DC

## 2019-02-19 MED ORDER — FLUTICASONE-UMECLIDIN-VILANT 100-62.5-25 MCG/INH IN AEPB: 1.0000 | INHALATION_SPRAY | Freq: Every day | RESPIRATORY_TRACT | 3 refills | Status: DC

## 2019-02-19 NOTE — Patient Instructions (Signed)
° ° ° °  If you have lab work done today you will be contacted with your lab results within the next 2 weeks.  If you have not heard from us then please contact us. The fastest way to get your results is to register for My Chart. ° ° °IF you received an x-ray today, you will receive an invoice from [facility]. Please contact [facility] at [phone] with questions or concerns regarding your invoice.  ° °IF you received labwork today, you will receive an invoice from [facility]. Please contact [facility] at [phone] with questions or concerns regarding your invoice.  ° °Our billing staff will not be able to assist you with questions regarding bills from these companies. ° °You will be contacted with the lab results as soon as they are available. The fastest way to get your results is to activate your My Chart account. Instructions are located on the last page of this paperwork. If you have not heard from us regarding the results in 2 weeks, please contact this office. °  ° ° ° °

## 2019-02-19 NOTE — Telephone Encounter (Signed)
Patient was told by Pharmacy there was not a Rx sent in. According to our computer system it was sent by B. Walsh during [place] on 02/12/19. Resent Rx and told her to contact us if her pharmacy still didn't get the order.   Nothing further needed at this time.

## 2019-02-19 NOTE — Progress Notes (Signed)
Telemedicine Encounter- SOAP NOTE Established Patient  This telephone encounter was conducted with the patient's (or proxy's) verbal consent via audio telecommunications: yes/no: Yes Patient was instructed to have this encounter in a suitably private space; and to only have persons present to whom they give permission to participate. In addition, patient identity was confirmed by use of name plus two identifiers (DOB and address).  I discussed the limitations, risks, security and privacy concerns of performing an evaluation and management service by telephone and the availability of in person appointments. I also discussed with the patient that there may be a patient responsible charge related to this service. The patient expressed understanding and agreed to proceed.  I spent a total of TIME; 0 MIN TO 60 MIN: 25 minutes talking with the patient or their proxy.  CC: oxygen requiring, copd, left side sciatica  Subjective   Tammy Ball is a 78 y.o. established patient. Telephone visit today for  HPI  Hypertension: Patient here for follow-up of elevated blood pressure. She is exercising and is adherent to low salt diet.  Blood pressure is well controlled at home. Cardiac symptoms none. Patient denies chest pain, chest pressure/discomfort, claudication, fatigue, irregular heart beat and lower extremity edema.  Cardiovascular risk factors: hypertension.   BP Readings from Last 3 Encounters:  02/19/19 126/65  01/22/19 (!) 180/98  01/12/19 (!) 160/90   Oxygen requiring Chronic COPD with respiratory failure At rest she uses room air only With activity her oxygen can get into the low 80s so she continues to use 2L [place] oxygen She has been doing yoga breathing exercises She has a cough with clear sputum  Left side sciatica She has pain from her low back down her left leg to the knee with some pain in her lower leg as well She was on steroid for her COPD for February and March     Patient Active Problem List   Diagnosis Date Noted  . HCAP (healthcare-associated pneumonia) 01/07/2019  . Allergic rhinitis 01/06/2019  . Chronic respiratory failure with hypoxia ([place]) 12/26/2018  . Age-related osteoporosis with current pathological fracture 11/21/2018  . Renal insufficiency 05/20/2018  . History of compression fracture of spine 12/10/2016  . Hypercalcemia 06/25/2016  . Insomnia 05/16/2016  . Pneumonitis, radiation ([place]) 04/11/2016  . S/P lobectomy of lung 01/18/2016  . Chemotherapy-induced neuropathy ([place]) 11/01/2015  . Non-small cell carcinoma of right lung, stage 2 ([place]) 10/03/2015  . Hyperlipidemia 08/14/2013  . Spondylolisthesis of lumbar region 07/03/2013  . Essential hypertension 12/17/2011  . COPD GOLD II  12/17/2011  . Renal cell cancer ([place]) 12/17/2011  . Sciatica of left side 12/17/2011  . Depression 12/17/2011    Past Medical History:  Diagnosis Date  . Anemia    was while doing chemo  . Anxiety   . Asthma   . Chemotherapy-induced neuropathy ([place]) 11/01/2015  . Claustrophobia   . COPD (chronic obstructive pulmonary disease) ([place])   . Depression   . Encounter for antineoplastic chemotherapy 02/09/2016  . Glaucoma   . Headache    prior to menopause  . Heart murmur   . History of echocardiogram    Echo 2/17: EF 26-71%, grade 1 diastolic dysfunction, mild MR, trivial pericardial effusion  . History of nuclear stress test    Myoview 2/17: no ischemia or scar, EF 79%; low risk  . History of radiation therapy 10/30/16-11/06/16  . Hyperlipidemia   . Hypertension   . Insomnia 05/16/2016  . Non-small cell carcinoma of  right lung, stage 2 ([place]) 10/03/2015  . Radiation 02/29/16-04/10/16   50.4 Gy to right central chest  . Renal cell carcinoma ([place])    L nephrectomy  in 2012    Current Outpatient Medications  Medication Sig Dispense Refill  . acetaminophen (TYLENOL) 500 MG tablet Take 500 mg by mouth every 6 (six) hours as needed for mild pain,  moderate pain, fever or headache. Reported on 05/24/2016    . albuterol (PROVENTIL) (2.5 MG/3ML) 0.083% nebulizer solution Take 3 mLs (2.5 mg total) by nebulization every 6 (six) hours as needed for wheezing or shortness of breath. 75 mL 12  . albuterol (VENTOLIN HFA) 108 (90 Base) MCG/ACT inhaler Inhale 1-2 puffs into the lungs every 6 (six) hours as needed for wheezing or shortness of breath. 1 Inhaler 6  . Ascorbic Acid (VITAMIN C) 1000 MG tablet Take 1,000 mg by mouth daily.    Marland Kitchen aspirin EC 81 MG tablet Take 81 mg by mouth daily. Reported on 04/11/2016    . atorvastatin (LIPITOR) 40 MG tablet TAKE ONE TABLET BY MOUTH ONCE DAILY. 90 tablet 1  . benzonatate (TESSALON) 100 MG capsule Take 1 capsule (100 mg total) by mouth 3 (three) times daily as needed for cough. 20 capsule 0  . calcium carbonate (TUMS EX) 750 MG chewable tablet Chew 1 tablet by mouth as needed for heartburn.    . Carboxymethylcellulose Sodium (ARTIFICIAL TEARS OP) Place 1 drop into both eyes daily as needed (dry eyes).    . Cholecalciferol (VITAMIN D3 PO) Take 1,200 mg by mouth daily.    . clonazePAM (KLONOPIN) 0.5 MG tablet Take 0.5 tablets (0.25 mg total) by mouth daily as needed for anxiety. 30 tablet 0  . fish oil-omega-3 fatty acids 1000 MG capsule Take 1 capsule by mouth daily.     . fluticasone (FLONASE) 50 MCG/ACT nasal spray Place 2 sprays into both nostrils daily. 16 g 2  . Fluticasone-Umeclidin-Vilant (TRELEGY ELLIPTA) 100-62.5-25 MCG/INH AEPB Inhale 1 puff into the lungs daily. 28 each 3  . guaiFENesin-dextromethorphan (ROBITUSSIN DM) 100-10 MG/5ML syrup Take 5 mLs by mouth every 4 (four) hours as needed for cough (chest congestion). 118 mL 0  . metoprolol succinate (TOPROL-XL) 50 MG 24 hr tablet Take 1 tablet (50 mg total) by mouth daily. Take with or immediately following a meal. 90 tablet 1  . OXYGEN Inhale 2 L into the lungs continuous.    . pantoprazole (PROTONIX) 40 MG tablet Take 1 tablet (40 mg total) by mouth  daily at 6 (six) AM. 30 tablet 3  . sodium chloride (OCEAN) 0.65 % SOLN nasal spray Place 1 spray into both nostrils as needed for congestion.    Marland Kitchen tiZANidine (ZANAFLEX) 2 MG tablet TAKE 1 TABLET BY MOUTH THREE TIMES A DAY AS NEEDED SPASMS 90 tablet 1  . traMADol (ULTRAM) 50 MG tablet Take 1 tablet (50 mg total) by mouth every 6 (six) hours as needed (BACK PAIN). 90 tablet 0  . traZODone (DESYREL) 50 MG tablet Take 0.5-1 tablets (25-50 mg total) by mouth at bedtime as needed for sleep. 90 tablet 3  . alendronate (FOSAMAX) 70 MG tablet Take 1 tablet (70 mg total) by mouth every 7 (seven) days. Take with a full glass of water on an empty stomach. (Patient not taking: Reported on 02/19/2019) 12 tablet 3  . gabapentin (NEURONTIN) 300 MG capsule Take 1 capsule (300 mg total) by mouth at bedtime. 30 capsule 1  . predniSONE (DELTASONE) 10 MG tablet Prednisone  dosing: Take  Prednisone 40mg  (4 tabs) x 3 days, then taper to 30mg  (3 tabs) x 3 days, then 20mg  (2 tabs) x 3days, then 10mg  (1 tab) x 3days, then OFF.  Dispense:  30 tabs, refills: None (Patient not taking: Reported on 02/12/2019) 30 tablet 0   Current Facility-Administered Medications  Medication Dose Route Frequency Provider Last Rate Last Dose  . acetaminophen (TYLENOL) tablet 500 mg  500 mg Oral Once Forrest Moron, MD        Allergies  Allergen Reactions  . Bee Venom Anaphylaxis, Shortness Of Breath, Swelling and Other (See Comments)    Swelling at site   . Amlodipine Swelling and Other (See Comments)    Swelling of the ankles and hands   . Levofloxacin Other (See Comments)    Joint pain   . Hctz [Hydrochlorothiazide] Palpitations and Other (See Comments)    Sweating     Social History   Socioeconomic History  . Marital status: Widowed    Spouse name: Not on file  . Number of children: 4  . Years of education: Not on file  . Highest education level: High school graduate  Occupational History  . Not on file  Social Needs  .  Financial resource strain: Not hard at all  . Food insecurity:    Worry: Never true    Inability: Never true  . Transportation needs:    Medical: No    Non-medical: No  Tobacco Use  . Smoking status: Former Smoker    Packs/day: 1.00    Years: 50.00    Pack years: 50.00    Types: Cigarettes    Last attempt to quit: 03/16/2011    Years since quitting: 7.9  . Smokeless tobacco: Never Used  . Tobacco comment: last 4-5 years of smoking, smoked 0.5 pack/day   Substance and Sexual Activity  . Alcohol use: No    Alcohol/week: 0.0 standard drinks  . Drug use: No  . Sexual activity: Not Currently  Lifestyle  . Physical activity:    Days per week: 0 days    Minutes per session: 0 min  . Stress: Only a little  Relationships  . Social connections:    Talks on phone: More than three times a week    Gets together: More than three times a week    Attends religious service: More than 4 times per year    Active member of club or organization: Yes    Attends meetings of clubs or organizations: More than 4 times per year    Relationship status: Widowed  . Intimate partner violence:    Fear of current or ex partner: No    Emotionally abused: No    Physically abused: No    Forced sexual activity: No  Other Topics Concern  . Not on file  Social History Narrative   Marital status: divorced; not dating in 2019.      Children: 4 children; 3 grandchildren adult; 4 gg.      Lives: alone in house      Employment: part-time work; substance abuse counselor; [employer].      Tobacco: quit smoking 2012; smoked 45 years      Alcohol: none      Drugs: none      Exercise: none in 2019; due to LEFT sciatica.      ADLs: independent with ADLs; drives.       Advanced Directives: YES: HCPOA: Nicholas Martinez/son.  FULL CODE but no prolonged  measures.      Occupation: Substance Abuse Estate agent   No exercise** Merged History Encounter **       ** Data from: 12/14/11  Enc Dept: [facility]       ** Data from: 12/17/11 Enc Dept: [facility]   Substance abuse counselor   Husband deceased   4 great grandchildren   Son works in same substance abuse counseling center as patient    ROS  Review of Systems  Constitutional: Negative for activity change, appetite change, chills and fever.  HENT: Negative for congestion, nosebleeds, trouble swallowing and voice change.   Respiratory: see hpi  Gastrointestinal: Negative for diarrhea, nausea and vomiting.  Genitourinary: Negative for difficulty urinating, dysuria, flank pain and hematuria.  Musculoskeletal: Negative for back pain, joint swelling and neck pain.  Neurological: see hpi See HPI. All other review of systems negative.    Objective   Vitals as reported by the patient: Today's Vitals   02/19/19 1127  BP: 126/65  Pulse: 91    Diagnoses and all orders for this visit:  Essential hypertension- Patient's blood pressure is at goal of 139/89 or less. Condition is stable. Continue current medications and treatment plan. I recommend that you exercise for 30-45 minutes 5 days a week. I also recommend a balanced diet with fruits and vegetables every day, lean meats, and little fried foods. The DASH diet (you can find this online) is a good example of this.   Chronic respiratory failure with hypoxia ([place])- continues to be oxygen requiring  Left sided sciatica- discussed Neurontin for sciatica on the left  Anxiety state-  Holding steady  Other orders -     gabapentin (NEURONTIN) 300 MG capsule; Take 1 capsule (300 mg total) by mouth at bedtime.     I discussed the assessment and treatment plan with the patient. The patient was provided an opportunity to ask questions and all were answered. The patient agreed with the plan and demonstrated an understanding of the instructions.   The patient was advised to call back or seek an in-person evaluation if the symptoms worsen or if the  condition fails to improve as anticipated.  I provided 25 minutes of non-face-to-face time during this encounter.  Forrest Moron, MD  Primary Care at [facility]

## 2019-02-19 NOTE — Progress Notes (Signed)
CC: 4 week hospital f/u (pneumonia) and blood pressure.  Per pt blood pressures are stable.  Pt needs refill on trilegy.  No travel outside the [place] or  in the past 3 weeks.   Depression score: 6

## 2019-03-29 ENCOUNTER — Other Ambulatory Visit: Payer: Self-pay | Admitting: Family Medicine

## 2019-03-29 NOTE — Telephone Encounter (Signed)
Requested medication (s) are due for refill today: yes  Requested medication (s) are on the active medication list: yes  Last refill:  01/30/19  Future visit scheduled: yes  Notes to clinic:  Medication not delegated to NT to refill   Requested Prescriptions  Pending Prescriptions Disp Refills   tiZANidine (ZANAFLEX) 2 MG tablet [Pharmacy Med Name: TIZANIDINE HCL 2 MG TABLET] 90 tablet 1    Sig: TAKE 1 TABLET BY MOUTH THREE TIMES A DAY AS NEEDED SPASMS     Not Delegated - Cardiovascular:  Alpha-2 Agonists - tizanidine Failed - 03/29/2019  9:17 AM      Failed - This refill cannot be delegated      Passed - Valid encounter within last 6 months    Recent Outpatient Visits          2 months ago Acute nonintractable headache, unspecified headache type   Primary Care at [facility], Arlie Solomons, MD   3 months ago Shortness of breath   Primary Care at [facility], Ellie Lunch, FNP   4 months ago Medicare annual wellness visit, subsequent   Primary Care at [place], MD   4 months ago Essential hypertension   Primary Care at [place], MD   7 months ago Encounter for immunization   Primary Care at [facility], Ranell Patrick, MD      Future Appointments            In 2 months Forrest Moron, MD Primary Care at [place], [facility]

## 2019-04-03 ENCOUNTER — Encounter

## 2019-04-03 NOTE — Telephone Encounter (Signed)
Refill? Just refilled on 01/30/19

## 2019-04-19 ENCOUNTER — Other Ambulatory Visit: Payer: Self-pay | Admitting: Family Medicine

## 2019-04-19 NOTE — Telephone Encounter (Signed)
Requested Prescriptions  Pending Prescriptions Disp Refills  . gabapentin (NEURONTIN) 300 MG capsule [Pharmacy Med Name: GABAPENTIN 300 MG CAPSULE] 90 capsule 1    Sig: TAKE 1 CAPSULE BY MOUTH EVERYDAY AT BEDTIME     Neurology: Anticonvulsants - gabapentin Passed - 04/19/2019  9:27 AM      Passed - Valid encounter within last 12 months    Recent Outpatient Visits          3 months ago Acute nonintractable headache, unspecified headache type   Primary Care at [facility], Arlie Solomons, MD   4 months ago Shortness of breath   Primary Care at [facility], Ellie Lunch, FNP   4 months ago Medicare annual wellness visit, subsequent   Primary Care at [facility], Arlie Solomons, MD   4 months ago Essential hypertension   Primary Care at [facility], Arlie Solomons, MD   7 months ago Encounter for immunization   Primary Care at [facility], Ranell Patrick, MD      Future Appointments            In 1 month Forrest Moron, MD Primary Care at [place], [facility]         '

## 2019-04-22 ENCOUNTER — Other Ambulatory Visit: Payer: Self-pay

## 2019-04-22 DIAGNOSIS — J449 Chronic obstructive pulmonary disease, unspecified: Secondary | ICD-10-CM

## 2019-04-22 NOTE — Progress Notes (Signed)
bmet required for ct w/ contrast.   Order entered.  Nothing further is needed.

## 2019-05-21 ENCOUNTER — Ambulatory Visit: Payer: Medicare Other | Admitting: Family Medicine

## 2019-05-22 ENCOUNTER — Ambulatory Visit: Payer: Medicare Other | Admitting: Family Medicine

## 2019-05-26 ENCOUNTER — Encounter (HOSPITAL_COMMUNITY): Payer: Self-pay

## 2019-05-26 ENCOUNTER — Inpatient Hospital Stay: Payer: Medicare Other | Attending: Internal Medicine

## 2019-05-26 ENCOUNTER — Other Ambulatory Visit: Payer: Self-pay

## 2019-05-26 ENCOUNTER — Ambulatory Visit (HOSPITAL_COMMUNITY)
Admission: RE | Admit: 2019-05-26 | Discharge: 2019-05-26 | Disposition: A | Discharge status: Home / Self Care | Payer: Medicare Other | Source: Ambulatory Visit | Attending: Internal Medicine | Admitting: Internal Medicine

## 2019-05-26 DIAGNOSIS — C3490 Malignant neoplasm of unspecified part of unspecified bronchus or lung: Secondary | ICD-10-CM

## 2019-05-26 DIAGNOSIS — I2510 Atherosclerotic heart disease of native coronary artery without angina pectoris: Secondary | ICD-10-CM | POA: Insufficient documentation

## 2019-05-26 DIAGNOSIS — Z9221 Personal history of antineoplastic chemotherapy: Secondary | ICD-10-CM | POA: Insufficient documentation

## 2019-05-26 DIAGNOSIS — I10 Essential (primary) hypertension: Secondary | ICD-10-CM | POA: Insufficient documentation

## 2019-05-26 DIAGNOSIS — Z923 Personal history of irradiation: Secondary | ICD-10-CM | POA: Insufficient documentation

## 2019-05-26 DIAGNOSIS — M545 Low back pain: Secondary | ICD-10-CM | POA: Diagnosis not present

## 2019-05-26 DIAGNOSIS — G4700 Insomnia, unspecified: Secondary | ICD-10-CM | POA: Diagnosis not present

## 2019-05-26 DIAGNOSIS — E785 Hyperlipidemia, unspecified: Secondary | ICD-10-CM | POA: Diagnosis not present

## 2019-05-26 DIAGNOSIS — Z79899 Other long term (current) drug therapy: Secondary | ICD-10-CM | POA: Insufficient documentation

## 2019-05-26 DIAGNOSIS — F418 Other specified anxiety disorders: Secondary | ICD-10-CM | POA: Insufficient documentation

## 2019-05-26 DIAGNOSIS — J449 Chronic obstructive pulmonary disease, unspecified: Secondary | ICD-10-CM | POA: Insufficient documentation

## 2019-05-26 DIAGNOSIS — Z85528 Personal history of other malignant neoplasm of kidney: Secondary | ICD-10-CM | POA: Diagnosis not present

## 2019-05-26 DIAGNOSIS — Z85118 Personal history of other malignant neoplasm of bronchus and lung: Secondary | ICD-10-CM | POA: Insufficient documentation

## 2019-05-26 DIAGNOSIS — I700 Atherosclerosis of aorta: Secondary | ICD-10-CM | POA: Insufficient documentation

## 2019-05-26 DIAGNOSIS — R011 Cardiac murmur, unspecified: Secondary | ICD-10-CM | POA: Diagnosis not present

## 2019-05-26 DIAGNOSIS — Z7982 Long term (current) use of aspirin: Secondary | ICD-10-CM | POA: Insufficient documentation

## 2019-05-26 DIAGNOSIS — J439 Emphysema, unspecified: Secondary | ICD-10-CM | POA: Diagnosis not present

## 2019-05-26 LAB — CBC WITH DIFFERENTIAL (CANCER CENTER ONLY)
Abs Immature Granulocytes: 0.02 10*3/uL (ref 0.00–0.07)
Basophils Absolute: 0.1 10*3/uL (ref 0.0–0.1)
Basophils Relative: 1 %
Eosinophils Absolute: 0.2 10*3/uL (ref 0.0–0.5)
Eosinophils Relative: 3 %
HCT: 39.8 % (ref 36.0–46.0)
Hemoglobin: 12.6 g/dL (ref 12.0–15.0)
Immature Granulocytes: 0 %
Lymphocytes Relative: 32 %
Lymphs Abs: 2.1 10*3/uL (ref 0.7–4.0)
MCH: 29.3 pg (ref 26.0–34.0)
MCHC: 31.7 g/dL (ref 30.0–36.0)
MCV: 92.6 fL (ref 80.0–100.0)
Monocytes Absolute: 0.8 10*3/uL (ref 0.1–1.0)
Monocytes Relative: 12 %
Neutro Abs: 3.5 10*3/uL (ref 1.7–7.7)
Neutrophils Relative %: 52 %
Platelet Count: 309 10*3/uL (ref 150–400)
RBC: 4.3 MIL/uL (ref 3.87–5.11)
RDW: 15.4 % (ref 11.5–15.5)
WBC Count: 6.6 10*3/uL (ref 4.0–10.5)
nRBC: 0 % (ref 0.0–0.2)

## 2019-05-26 LAB — CMP (CANCER CENTER ONLY)
ALT: 16 U/L (ref 0–44)
AST: 19 U/L (ref 15–41)
Albumin: 4 g/dL (ref 3.5–5.0)
Alkaline Phosphatase: 82 U/L (ref 38–126)
Anion gap: 10 (ref 5–15)
BUN: 19 mg/dL (ref 8–23)
CO2: 29 mmol/L (ref 22–32)
Calcium: 10.1 mg/dL (ref 8.9–10.3)
Chloride: 103 mmol/L (ref 98–111)
Creatinine: 1.24 mg/dL — ABNORMAL HIGH (ref 0.44–1.00)
GFR, Est AFR Am: 49 mL/min — ABNORMAL LOW (ref >60)
GFR, Estimated: 42 mL/min — ABNORMAL LOW (ref >60)
Glucose, Bld: 104 mg/dL — ABNORMAL HIGH (ref 70–99)
Potassium: 4.5 mmol/L (ref 3.5–5.1)
Sodium: 142 mmol/L (ref 135–145)
Total Bilirubin: 0.3 mg/dL (ref 0.3–1.2)
Total Protein: 7 g/dL (ref 6.5–8.1)

## 2019-05-26 MED ORDER — IOHEXOL 300 MG/ML  SOLN
60.0000 mL | Freq: Once | INTRAMUSCULAR | Status: AC | PRN
  Administered 2019-05-26: 60 mL via INTRAVENOUS

## 2019-05-26 MED ORDER — SODIUM CHLORIDE (PF) 0.9 % IJ SOLN
INTRAMUSCULAR | Status: AC
  Filled 2019-05-26: qty 50

## 2019-05-28 ENCOUNTER — Inpatient Hospital Stay (HOSPITAL_BASED_OUTPATIENT_CLINIC_OR_DEPARTMENT_OTHER): Payer: Medicare Other | Admitting: Internal Medicine

## 2019-05-28 ENCOUNTER — Other Ambulatory Visit: Payer: Self-pay

## 2019-05-28 ENCOUNTER — Encounter: Payer: Self-pay | Admitting: Internal Medicine

## 2019-05-28 ENCOUNTER — Telehealth: Payer: Self-pay | Admitting: Internal Medicine

## 2019-05-28 VITALS — BP 147/93 | HR 78 | Temp 98.3°F | Resp 20 | Ht 63.5 in | Wt 173.4 lb

## 2019-05-28 DIAGNOSIS — I700 Atherosclerosis of aorta: Secondary | ICD-10-CM

## 2019-05-28 DIAGNOSIS — Z85528 Personal history of other malignant neoplasm of kidney: Secondary | ICD-10-CM

## 2019-05-28 DIAGNOSIS — Z923 Personal history of irradiation: Secondary | ICD-10-CM

## 2019-05-28 DIAGNOSIS — I2510 Atherosclerotic heart disease of native coronary artery without angina pectoris: Secondary | ICD-10-CM | POA: Diagnosis not present

## 2019-05-28 DIAGNOSIS — E785 Hyperlipidemia, unspecified: Secondary | ICD-10-CM

## 2019-05-28 DIAGNOSIS — Z79899 Other long term (current) drug therapy: Secondary | ICD-10-CM

## 2019-05-28 DIAGNOSIS — M545 Low back pain: Secondary | ICD-10-CM | POA: Diagnosis not present

## 2019-05-28 DIAGNOSIS — F418 Other specified anxiety disorders: Secondary | ICD-10-CM | POA: Diagnosis not present

## 2019-05-28 DIAGNOSIS — R011 Cardiac murmur, unspecified: Secondary | ICD-10-CM

## 2019-05-28 DIAGNOSIS — G4700 Insomnia, unspecified: Secondary | ICD-10-CM | POA: Diagnosis not present

## 2019-05-28 DIAGNOSIS — I10 Essential (primary) hypertension: Secondary | ICD-10-CM

## 2019-05-28 DIAGNOSIS — J449 Chronic obstructive pulmonary disease, unspecified: Secondary | ICD-10-CM | POA: Diagnosis not present

## 2019-05-28 DIAGNOSIS — C3490 Malignant neoplasm of unspecified part of unspecified bronchus or lung: Secondary | ICD-10-CM

## 2019-05-28 DIAGNOSIS — Z9221 Personal history of antineoplastic chemotherapy: Secondary | ICD-10-CM

## 2019-05-28 DIAGNOSIS — C3491 Malignant neoplasm of unspecified part of right bronchus or lung: Secondary | ICD-10-CM

## 2019-05-28 DIAGNOSIS — Z85118 Personal history of other malignant neoplasm of bronchus and lung: Secondary | ICD-10-CM

## 2019-05-28 DIAGNOSIS — Z7982 Long term (current) use of aspirin: Secondary | ICD-10-CM

## 2019-05-28 NOTE — Addendum Note (Signed)
Addended by: Lucile Crater on: 05/28/2019 11:27 AM   Modules accepted: Orders

## 2019-05-28 NOTE — Telephone Encounter (Signed)
Scheduled appt per 7/16 los - pt aware - per patient they will check my chart

## 2019-05-28 NOTE — Progress Notes (Signed)
[place] [phone]   [phone]  OFFICE PROGRESS NOTE  Forrest Moron, MD [address] [place] [address]  DIAGNOSIS: Stage IIIA (T2a, N2, M0) non-small cell lung cancer, squamous cell carcinoma presented with large right upper lobe lung mass with right hilar involvement diagnosed in November 2016.  PRIOR THERAPY:  1) Neoadjuvant systemic chemotherapy with carboplatin for AUC of 6 and paclitaxel 200 MG/M2 every 3 weeks, status post 3 cycles with partial response. 2) Video bronchoscopy, right video-assisted thoracoscopy, minithoracotomy, right upper lobectomy with lymph node dissection on 01/18/2016. The final pathology revealed residual squamous cell carcinoma measuring 3.1 cm with close resection margin and metastatic carcinoma to lymph nodes at levels 10R, 12R and 4R. (ypT2a, yN2).  3) Concurrent chemoradiation with weekly carboplatin for AUC of 2 and paclitaxel 45 MG/M2. First dose 02/27/2016. Status post 5 cycles. Last dose was given 03/26/2016. 4) SBRT to left lower lobe lung nodule under the care of Dr. Sondra Come completed on 11/06/2016.  CURRENT THERAPY: Observation.  INTERVAL HISTORY: Tammy Ball 78 y.o. female returns to the clinic today for follow-up visit.  The patient is feeling fine today with no concerning complaints except for mild low back pain with radiation to the left leg consistent with her sciatica that she had before.  She denied having any current chest pain, shortness of breath, cough or hemoptysis.  She denied having any fever or chills.  She has no nausea, vomiting, diarrhea or constipation.  She denied having any headache or visual changes.  She had repeat CT scan of the chest performed recently and she is here for evaluation and discussion of her scan results.  MEDICAL HISTORY: Past Medical History:  Diagnosis Date   Anemia    was while doing chemo   Anxiety    Asthma    Chemotherapy-induced neuropathy  ([place]) 11/01/2015   Claustrophobia    COPD (chronic obstructive pulmonary disease) ([place])    Depression    Encounter for antineoplastic chemotherapy 02/09/2016   Glaucoma    Headache    prior to menopause   Heart murmur    History of echocardiogram    Echo 2/17: EF 19-62%, grade 1 diastolic dysfunction, mild MR, trivial pericardial effusion   History of nuclear stress test    Myoview 2/17: no ischemia or scar, EF 79%; low risk   History of radiation therapy 10/30/16-11/06/16   Hyperlipidemia    Hypertension    Insomnia 05/16/2016   Non-small cell carcinoma of right lung, stage 2 ([place]) 10/03/2015   Radiation 02/29/16-04/10/16   50.4 Gy to right central chest   Renal cell carcinoma ([place])    L nephrectomy  in 2012    ALLERGIES:  is allergic to bee venom; amlodipine; levofloxacin; and hctz [hydrochlorothiazide].  MEDICATIONS:  Current Outpatient Medications  Medication Sig Dispense Refill   acetaminophen (TYLENOL) 500 MG tablet Take 500 mg by mouth every 6 (six) hours as needed for mild pain, moderate pain, fever or headache. Reported on 05/24/2016     albuterol (PROVENTIL) (2.5 MG/3ML) 0.083% nebulizer solution Take 3 mLs (2.5 mg total) by nebulization every 6 (six) hours as needed for wheezing or shortness of breath. 75 mL 12   albuterol (VENTOLIN HFA) 108 (90 Base) MCG/ACT inhaler Inhale 1-2 puffs into the lungs every 6 (six) hours as needed for wheezing or shortness of breath. 1 Inhaler 6   alendronate (FOSAMAX) 70 MG tablet Take 1 tablet (70 mg total) by mouth  every 7 (seven) days. Take with a full glass of water on an empty stomach. (Patient not taking: Reported on 02/19/2019) 12 tablet 3   Ascorbic Acid (VITAMIN C) 1000 MG tablet Take 1,000 mg by mouth daily.     aspirin EC 81 MG tablet Take 81 mg by mouth daily. Reported on 04/11/2016     atorvastatin (LIPITOR) 40 MG tablet TAKE ONE TABLET BY MOUTH ONCE DAILY. 90 tablet 1   benzonatate (TESSALON) 100 MG capsule  Take 1 capsule (100 mg total) by mouth 3 (three) times daily as needed for cough. 20 capsule 0   calcium carbonate (TUMS EX) 750 MG chewable tablet Chew 1 tablet by mouth as needed for heartburn.     Carboxymethylcellulose Sodium (ARTIFICIAL TEARS OP) Place 1 drop into both eyes daily as needed (dry eyes).     Cholecalciferol (VITAMIN D3 PO) Take 1,200 mg by mouth daily.     clonazePAM (KLONOPIN) 0.5 MG tablet Take 0.5 tablets (0.25 mg total) by mouth daily as needed for anxiety. 30 tablet 0   fish oil-omega-3 fatty acids 1000 MG capsule Take 1 capsule by mouth daily.      fluticasone (FLONASE) 50 MCG/ACT nasal spray Place 2 sprays into both nostrils daily. 16 g 2   Fluticasone-Umeclidin-Vilant (TRELEGY ELLIPTA) 100-62.5-25 MCG/INH AEPB Inhale 1 puff into the lungs daily. 28 each 3   gabapentin (NEURONTIN) 300 MG capsule TAKE 1 CAPSULE BY MOUTH EVERYDAY AT BEDTIME 90 capsule 1   guaiFENesin-dextromethorphan (ROBITUSSIN DM) 100-10 MG/5ML syrup Take 5 mLs by mouth every 4 (four) hours as needed for cough (chest congestion). 118 mL 0   metoprolol succinate (TOPROL-XL) 50 MG 24 hr tablet Take 1 tablet (50 mg total) by mouth daily. Take with or immediately following a meal. 90 tablet 1   OXYGEN Inhale 2 L into the lungs continuous.     pantoprazole (PROTONIX) 40 MG tablet Take 1 tablet (40 mg total) by mouth daily at 6 (six) AM. 30 tablet 3   predniSONE (DELTASONE) 10 MG tablet Prednisone dosing: Take  Prednisone 40mg  (4 tabs) x 3 days, then taper to 30mg  (3 tabs) x 3 days, then 20mg  (2 tabs) x 3days, then 10mg  (1 tab) x 3days, then OFF.  Dispense:  30 tabs, refills: None (Patient not taking: Reported on 02/12/2019) 30 tablet 0   sodium chloride (OCEAN) 0.65 % SOLN nasal spray Place 1 spray into both nostrils as needed for congestion.     tiZANidine (ZANAFLEX) 2 MG tablet TAKE 1 TABLET BY MOUTH THREE TIMES A DAY AS NEEDED SPASMS 90 tablet 1   traMADol (ULTRAM) 50 MG tablet Take 1 tablet  (50 mg total) by mouth every 6 (six) hours as needed (BACK PAIN). 90 tablet 0   traZODone (DESYREL) 50 MG tablet Take 0.5-1 tablets (25-50 mg total) by mouth at bedtime as needed for sleep. 90 tablet 3   Current Facility-Administered Medications  Medication Dose Route Frequency Provider Last Rate Last Dose   acetaminophen (TYLENOL) tablet 500 mg  500 mg Oral Once Forrest Moron, MD        SURGICAL HISTORY:  Past Surgical History:  Procedure Laterality Date   BACK SURGERY     cervical 1991   EYE SURGERY     IR GENERIC HISTORICAL  01/16/2017   IR RADIOLOGIST EVAL & MGMT 01/16/2017 [facility]   IR GENERIC HISTORICAL  01/31/2017   IR FLUORO GUIDED NEEDLE PLC ASPIRATION/INJECTION LOC 01/31/2017 Luanne Bras, MD [facility]  IR GENERIC HISTORICAL  01/31/2017   IR VERTEBROPLASTY CERV/THOR BX INC UNI/BIL INC/INJECT/IMAGING 01/31/2017 Luanne Bras, MD [facility]   IR GENERIC HISTORICAL  01/31/2017   IR VERTEBROPLASTY EA ADDL (T&LS) BX INC UNI/BIL INC INJECT/IMAGING 01/31/2017 Luanne Bras, MD [facility]   kidney cancer     NEPHRECTOMY     SPINE SURGERY     TUBAL LIGATION     VIDEO ASSISTED THORACOSCOPY (VATS)/WEDGE RESECTION Right 01/18/2016   Procedure: VIDEO ASSISTED THORACOSCOPY (VATS)/LUNG RESECTION, THOROCOTOMY, RIGHT UPPER LOBECTOMY, LYMPH NODE DISSECTION, PLACEMENT OF ON Q;  Surgeon: [name], MD;  Location: [facility];  Service: Thoracic;  Laterality: Right;   VIDEO BRONCHOSCOPY Bilateral 09/20/2015   Procedure: VIDEO BRONCHOSCOPY WITHOUT FLUORO;  Surgeon: Rigoberto Noel, MD;  Location: [facility];  Service: Cardiopulmonary;  Laterality: Bilateral;   VIDEO BRONCHOSCOPY N/A 01/18/2016   Procedure: VIDEO BRONCHOSCOPY;  Surgeon: [name], MD;  Location: [facility];  Service: Thoracic;  Laterality: N/A;    REVIEW OF SYSTEMS:  A comprehensive review of systems was negative except for: Musculoskeletal: positive for back pain   PHYSICAL EXAMINATION:  General appearance: alert, cooperative and no distress Head: Normocephalic, without obvious abnormality, atraumatic Neck: no adenopathy, no JVD, supple, symmetrical, trachea midline and thyroid not enlarged, symmetric, no tenderness/mass/nodules Lymph nodes: Cervical, supraclavicular, and axillary nodes normal. Resp: clear to auscultation bilaterally Back: symmetric, no curvature. ROM normal. No CVA tenderness. Cardio: regular rate and rhythm, S1, S2 normal, no murmur, click, rub or gallop GI: soft, non-tender; bowel sounds normal; no masses,  no organomegaly Extremities: extremities normal, atraumatic, no cyanosis or edema  ECOG PERFORMANCE STATUS: 1 - Symptomatic but completely ambulatory  Blood pressure (!) 147/93, pulse 78, temperature 98.3 F (36.8 C), temperature source Oral, resp. rate 20, height 5' 3.5" (1.613 m), weight 173 lb 6.4 oz (78.7 kg), SpO2 95 %.  LABORATORY DATA: Lab Results  Component Value Date   WBC 6.6 05/26/2019   HGB 12.6 05/26/2019   HCT 39.8 05/26/2019   MCV 92.6 05/26/2019   PLT 309 05/26/2019      Chemistry      Component Value Date/Time   NA 142 05/26/2019 0750   NA 140 11/21/2018 1058   NA 140 11/11/2017 0939   K 4.5 05/26/2019 0750   K 4.6 11/11/2017 0939   CL 103 05/26/2019 0750   CO2 29 05/26/2019 0750   CO2 28 11/11/2017 0939   BUN 19 05/26/2019 0750   BUN 23 11/21/2018 1058   BUN 30.3 (H) 11/11/2017 0939   CREATININE 1.24 (H) 05/26/2019 0750   CREATININE 1.3 (H) 11/11/2017 0939   GLU 105 12/10/2009      Component Value Date/Time   CALCIUM 10.1 05/26/2019 0750   CALCIUM 10.0 11/11/2017 0939   ALKPHOS 82 05/26/2019 0750   ALKPHOS 102 11/11/2017 0939   AST 19 05/26/2019 0750   AST 18 11/11/2017 0939   ALT 16 05/26/2019 0750   ALT 19 11/11/2017 0939   BILITOT 0.3 05/26/2019 0750   BILITOT 0.42 11/11/2017 0939       RADIOGRAPHIC STUDIES: Ct Chest W Contrast  Result Date: 05/26/2019 CLINICAL DATA:  Chronic shortness of breath.  Non-small cell lung cancer. EXAM: CT CHEST WITH CONTRAST TECHNIQUE: Multidetector CT imaging of the chest was performed during intravenous contrast administration. CONTRAST:  80mL OMNIPAQUE IOHEXOL 300 MG/ML  SOLN COMPARISON:  01/09/2019 FINDINGS: Cardiovascular: Normal heart size. No pericardial effusion. Aortic atherosclerosis. Calcifications within the LAD, left circumflex coronary arteries noted.  Mediastinum/Nodes: Normal appearance of the thyroid gland. The trachea appears patent and is midline. Normal appearance of the esophagus. No enlarged mediastinal or hilar lymph nodes. No axillary or supraclavicular adenopathy. Lungs/Pleura: Moderate changes of emphysema. Bandlike area of scarring within the posterior left lung base is not significantly changed when compared with the previous exam. Status post right upper lobectomy. Paramediastinal radiation change identified in the right middle lobe and right lower lobe. No suspicious pulmonary nodule or mass identified within the right lung to suggest residual or recurrence of tumor. Stable subpleural nodule in the left upper lobe measuring 5 mm. Upper Abdomen: No acute abnormality. Musculoskeletal: No chest wall abnormality. No acute or significant osseous findings. Chronic upper thoracic spine compression deformities are identified and are unchanged. No acute or suspicious osseous abnormality identified. IMPRESSION: 1. Stable exam. Bandlike area of scarring or chronic atelectasis in the posterior lung base is stable and is likely post infectious or inflammatory in etiology. 2. Stable postoperative and post radiation change in the right lung without evidence for local tumor recurrence or metastatic disease. 3. Aortic Atherosclerosis (ICD10-I70.0) and Emphysema (ICD10-J43.9). Coronary artery calcifications. Electronically Signed   By: Kerby Moors M.D.   On: 05/26/2019 11:33    ASSESSMENT AND PLAN:  This is a very pleasant 78 years old white female with a stage  IIIa non-small cell lung cancer, squamous cell carcinoma status post neoadjuvant systemic chemotherapy was carboplatin and paclitaxel with partial response followed by right upper lobectomy and lymph node dissection. She was found to have evidence of metastatic disease to the mediastinal lymph nodes and close resection margin. The patient underwent a course of concurrent chemoradiation with weekly carboplatin and paclitaxel. She was also treated with SBRT left lower lobe pulmonary nodule under the care of Dr. Sondra Come. She is doing fine today with no concerning complaints.  She has been in observation since her last treatment. She had repeat CT scan of the chest performed recently.  I personally and independently reviewed the scans and discussed the results with the patient. Her scan showed no concerning findings for disease recurrence or progression. I recommended for the patient to continue on observation with repeat CT scan of the chest without contrast in 6 months. She was advised to call immediately if she has any concerning symptoms in the interval. The patient voices understanding of current disease status and treatment options and is in agreement with the current care plan. All questions were answered. The patient knows to call the clinic with any problems, questions or concerns. We can certainly see the patient much sooner if necessary. I spent 10 minutes counseling the patient face to face. The total time spent in the appointment was 15 minutes.  Disclaimer: This note was dictated with voice recognition software. Similar sounding words can inadvertently be transcribed and may not be corrected upon review.

## 2019-06-01 ENCOUNTER — Ambulatory Visit (INDEPENDENT_AMBULATORY_CARE_PROVIDER_SITE_OTHER): Payer: Medicare Other

## 2019-06-01 ENCOUNTER — Ambulatory Visit (INDEPENDENT_AMBULATORY_CARE_PROVIDER_SITE_OTHER): Payer: Medicare Other | Admitting: Family Medicine

## 2019-06-01 ENCOUNTER — Other Ambulatory Visit: Payer: Self-pay

## 2019-06-01 ENCOUNTER — Encounter: Payer: Self-pay | Admitting: Family Medicine

## 2019-06-01 VITALS — BP 156/90 | HR 78 | Temp 98.7°F | Resp 17 | Ht 63.5 in | Wt 173.0 lb

## 2019-06-01 DIAGNOSIS — M545 Low back pain: Secondary | ICD-10-CM | POA: Diagnosis not present

## 2019-06-01 DIAGNOSIS — J449 Chronic obstructive pulmonary disease, unspecified: Secondary | ICD-10-CM

## 2019-06-01 DIAGNOSIS — M5442 Lumbago with sciatica, left side: Secondary | ICD-10-CM

## 2019-06-01 DIAGNOSIS — I10 Essential (primary) hypertension: Secondary | ICD-10-CM | POA: Diagnosis not present

## 2019-06-01 DIAGNOSIS — C3491 Malignant neoplasm of unspecified part of right bronchus or lung: Secondary | ICD-10-CM | POA: Diagnosis not present

## 2019-06-01 MED ORDER — TRAZODONE HCL 50 MG PO TABS: 50.0000 mg | ORAL_TABLET | Freq: Every evening | ORAL | 3 refills | Status: DC | PRN

## 2019-06-01 MED ORDER — METOPROLOL SUCCINATE ER 50 MG PO TB24: 50.0000 mg | ORAL_TABLET | Freq: Every day | ORAL | 1 refills | Status: DC

## 2019-06-01 MED ORDER — TRAMADOL HCL 50 MG PO TABS: 50.0000 mg | ORAL_TABLET | Freq: Four times a day (QID) | ORAL | 0 refills | Status: DC | PRN

## 2019-06-01 MED ORDER — PREDNISONE 10 MG PO TABS: ORAL_TABLET | ORAL | 0 refills | Status: AC

## 2019-06-01 NOTE — Progress Notes (Signed)
Established Patient Office Visit  Subjective:  Patient ID: Tammy Ball, female    DOB: [birth year]  Age: 78 y.o. MRN: [id number]  CC:  Chief Complaint  Patient presents with  . cholesterol and blood pressure    6 month recheck.  Depression score: 8  . Back Pain    onset: 2011 but has worsened since March, per pt having pain this morning and may be the reason for her elevated bp    HPI Tammy Ball presents for   Patient reports that she has lower back pain and left leg pain  She states that the pain goes from her back and shoots down the left leg She has an intense shooting pain that is not constant but when she is initiating movement It feels like the pain you get when you hit the elbow on a cement black  The pain goes down her back to her left leg and feels like lightening but stops at the knee and is only on the left side She states that the pain is intermittent but is aggravated by movement  She reports that the last episode was 2 days ago She had these issues off and on for the past 3 months     Hypertension: Patient here for follow-up of elevated blood pressure. She is not exercising and is adherent to low salt diet.  Blood pressure is well controlled at home. Cardiac symptoms none. Patient denies chest pain, chest pressure/discomfort, dyspnea and exertional chest pressure/discomfort.  Cardiovascular risk factors: hypertension. Use of agents associated with hypertension: none. History of target organ damage: none. BP Readings from Last 3 Encounters:  06/01/19 (!) 156/90  05/28/19 (!) 147/93  02/19/19 126/65    Component     Latest Ref Rng & Units 05/26/2019  Sodium     135 - 145 mmol/L 142  Potassium     3.5 - 5.1 mmol/L 4.5  Chloride     98 - 111 mmol/L 103  CO2     22 - 32 mmol/L 29  Glucose     70 - 99 mg/dL 104 (H)  BUN     8 - 23 mg/dL 19  Creatinine     0.44 - 1.00 mg/dL 1.24 (H)  Calcium     8.9 - 10.3 mg/dL 10.1  Total Protein     6.5 - 8.1  g/dL 7.0  Albumin     3.5 - 5.0 g/dL 4.0  AST     15 - 41 U/L 19  ALT     0 - 44 U/L 16  Alkaline Phosphatase     38 - 126 U/L 82  Total Bilirubin     0.3 - 1.2 mg/dL 0.3  GFR, Est Non African American     >60 mL/min 42 (L)  GFR, Est African American     >60 mL/min 49 (L)  Anion gap     5 - 15 10    COPD Pt states that in the last couple of days she had an increase in wheezing and mucus that were small pieces She states that she has no fevers or sick contacts She reports that she was discharged for pneumonia      Past Medical History:  Diagnosis Date  . Anemia    was while doing chemo  . Anxiety   . Asthma   . Chemotherapy-induced neuropathy ([place]) 11/01/2015  . Claustrophobia   . COPD (chronic obstructive pulmonary disease) ([place])   . Depression   .  Encounter for antineoplastic chemotherapy 02/09/2016  . Glaucoma   . Headache    prior to menopause  . Heart murmur   . History of echocardiogram    Echo 2/17: EF 77-41%, grade 1 diastolic dysfunction, mild MR, trivial pericardial effusion  . History of nuclear stress test    Myoview 2/17: no ischemia or scar, EF 79%; low risk  . History of radiation therapy 10/30/16-11/06/16  . Hyperlipidemia   . Hypertension   . Insomnia 05/16/2016  . Non-small cell carcinoma of right lung, stage 2 ([place]) 10/03/2015  . Radiation 02/29/16-04/10/16   50.4 Gy to right central chest  . Renal cell carcinoma ([place])    L nephrectomy  in 2012    Past Surgical History:  Procedure Laterality Date  . BACK SURGERY     cervical 1991  . EYE SURGERY    . IR GENERIC HISTORICAL  01/16/2017   IR RADIOLOGIST EVAL & MGMT 01/16/2017 [facility]  . IR GENERIC HISTORICAL  01/31/2017   IR FLUORO GUIDED NEEDLE PLC ASPIRATION/INJECTION LOC 01/31/2017 Luanne Bras, MD [facility]  . IR GENERIC HISTORICAL  01/31/2017   IR VERTEBROPLASTY CERV/THOR BX INC UNI/BIL INC/INJECT/IMAGING 01/31/2017 Luanne Bras, MD [facility]  . IR GENERIC HISTORICAL   01/31/2017   IR VERTEBROPLASTY EA ADDL (T&LS) BX INC UNI/BIL INC INJECT/IMAGING 01/31/2017 Luanne Bras, MD [facility]  . kidney cancer    . NEPHRECTOMY    . SPINE SURGERY    . TUBAL LIGATION    . VIDEO ASSISTED THORACOSCOPY (VATS)/WEDGE RESECTION Right 01/18/2016   Procedure: VIDEO ASSISTED THORACOSCOPY (VATS)/LUNG RESECTION, THOROCOTOMY, RIGHT UPPER LOBECTOMY, LYMPH NODE DISSECTION, PLACEMENT OF ON Q;  Surgeon: [name], MD;  Location: [facility];  Service: Thoracic;  Laterality: Right;  Marland Kitchen VIDEO BRONCHOSCOPY Bilateral 09/20/2015   Procedure: VIDEO BRONCHOSCOPY WITHOUT FLUORO;  Surgeon: Rigoberto Noel, MD;  Location: [facility];  Service: Cardiopulmonary;  Laterality: Bilateral;  . VIDEO BRONCHOSCOPY N/A 01/18/2016   Procedure: VIDEO BRONCHOSCOPY;  Surgeon: [name], MD;  Location: [facility] OR;  Service: Thoracic;  Laterality: N/A;    Family History  Problem Relation Age of Onset  . Heart disease Sister   . Obesity Brother   . Heart attack Daughter 36       s/p CABG  . Glaucoma Daughter   . Breast cancer Sister   . Birth defects Sister   . Cancer Mother        Bladder Cancer  . Hypertension Mother   . CAD Mother 67  . Cancer Maternal Grandmother     Social History   Socioeconomic History  . Marital status: Widowed    Spouse name: Not on file  . Number of children: 4  . Years of education: Not on file  . Highest education level: High school graduate  Occupational History  . Not on file  Social Needs  . Financial resource strain: Not hard at all  . Food insecurity    Worry: Never true    Inability: Never true  . Transportation needs    Medical: No    Non-medical: No  Tobacco Use  . Smoking status: Former Smoker    Packs/day: 1.00    Years: 50.00    Pack years: 50.00    Types: Cigarettes    Quit date: 03/16/2011    Years since quitting: 8.2  . Smokeless tobacco: Never Used  . Tobacco comment: last 4-5 years of smoking, smoked 0.5 pack/day   Substance and  Sexual  Activity  . Alcohol use: No    Alcohol/week: 0.0 standard drinks  . Drug use: No  . Sexual activity: Not Currently  Lifestyle  . Physical activity    Days per week: 0 days    Minutes per session: 0 min  . Stress: Only a little  Relationships  . Social connections    Talks on phone: More than three times a week    Gets together: More than three times a week    Attends religious service: More than 4 times per year    Active member of club or organization: Yes    Attends meetings of clubs or organizations: More than 4 times per year    Relationship status: Widowed  . Intimate partner violence    Fear of current or ex partner: No    Emotionally abused: No    Physically abused: No    Forced sexual activity: No  Other Topics Concern  . Not on file  Social History Narrative   Marital status: divorced; not dating in 2019.      Children: 4 children; 3 grandchildren adult; 4 gg.      Lives: alone in house      Employment: part-time work; substance abuse counselor; [employer].      Tobacco: quit smoking 2012; smoked 45 years      Alcohol: none      Drugs: none      Exercise: none in 2019; due to LEFT sciatica.      ADLs: independent with ADLs; drives.       Advanced Directives: YES: HCPOA: Nicholas Martinez/son.  FULL CODE but no prolonged measures.      Occupation: Substance Abuse Estate agent   No exercise** Merged History Encounter **       ** Data from: 12/14/11 Enc Dept: [facility]       ** Data from: 12/17/11 Enc Dept: [facility]   Substance abuse counselor   Husband deceased   4 great grandchildren   Son works in same substance abuse counseling center as patient    Outpatient Medications Prior to Visit  Medication Sig Dispense Refill  . acetaminophen (TYLENOL) 500 MG tablet Take 500 mg by mouth every 6 (six) hours as needed for mild pain, moderate pain, fever or headache. Reported on 05/24/2016    . albuterol  (PROVENTIL) (2.5 MG/3ML) 0.083% nebulizer solution Take 3 mLs (2.5 mg total) by nebulization every 6 (six) hours as needed for wheezing or shortness of breath. 75 mL 12  . albuterol (VENTOLIN HFA) 108 (90 Base) MCG/ACT inhaler Inhale 1-2 puffs into the lungs every 6 (six) hours as needed for wheezing or shortness of breath. 1 Inhaler 6  . Ascorbic Acid (VITAMIN C) 1000 MG tablet Take 1,000 mg by mouth daily.    Marland Kitchen aspirin EC 81 MG tablet Take 81 mg by mouth daily. Reported on 04/11/2016    . atorvastatin (LIPITOR) 40 MG tablet TAKE ONE TABLET BY MOUTH ONCE DAILY. 90 tablet 1  . benzonatate (TESSALON) 100 MG capsule Take 1 capsule (100 mg total) by mouth 3 (three) times daily as needed for cough. 20 capsule 0  . calcium carbonate (TUMS EX) 750 MG chewable tablet Chew 1 tablet by mouth as needed for heartburn.    . Carboxymethylcellulose Sodium (ARTIFICIAL TEARS OP) Place 1 drop into both eyes daily as needed (dry eyes).    . Cholecalciferol (VITAMIN D3 PO) Take 1,200  mg by mouth daily.    . clonazePAM (KLONOPIN) 0.5 MG tablet Take 0.5 tablets (0.25 mg total) by mouth daily as needed for anxiety. 30 tablet 0  . fish oil-omega-3 fatty acids 1000 MG capsule Take 1 capsule by mouth daily.     . fluticasone (FLONASE) 50 MCG/ACT nasal spray Place 2 sprays into both nostrils daily. 16 g 2  . Fluticasone-Umeclidin-Vilant (TRELEGY ELLIPTA) 100-62.5-25 MCG/INH AEPB Inhale 1 puff into the lungs daily. 28 each 3  . guaiFENesin-dextromethorphan (ROBITUSSIN DM) 100-10 MG/5ML syrup Take 5 mLs by mouth every 4 (four) hours as needed for cough (chest congestion). 118 mL 0  . OXYGEN Inhale 2 L into the lungs continuous.    . sodium chloride (OCEAN) 0.65 % SOLN nasal spray Place 1 spray into both nostrils as needed for congestion.    Marland Kitchen tiZANidine (ZANAFLEX) 2 MG tablet TAKE 1 TABLET BY MOUTH THREE TIMES A DAY AS NEEDED SPASMS 90 tablet 1  . metoprolol succinate (TOPROL-XL) 50 MG 24 hr tablet Take 1 tablet (50 mg total)  by mouth daily. Take with or immediately following a meal. 90 tablet 1  . traMADol (ULTRAM) 50 MG tablet Take 1 tablet (50 mg total) by mouth every 6 (six) hours as needed (BACK PAIN). 90 tablet 0  . traZODone (DESYREL) 50 MG tablet Take 0.5-1 tablets (25-50 mg total) by mouth at bedtime as needed for sleep. 90 tablet 3  . alendronate (FOSAMAX) 70 MG tablet Take 1 tablet (70 mg total) by mouth every 7 (seven) days. Take with a full glass of water on an empty stomach. (Patient not taking: Reported on 06/01/2019) 12 tablet 3   Facility-Administered Medications Prior to Visit  Medication Dose Route Frequency Provider Last Rate Last Dose  . acetaminophen (TYLENOL) tablet 500 mg  500 mg Oral Once Forrest Moron, MD        Allergies  Allergen Reactions  . Bee Venom Anaphylaxis, Shortness Of Breath, Swelling and Other (See Comments)    Swelling at site   . Amlodipine Swelling and Other (See Comments)    Swelling of the ankles and hands   . Levofloxacin Other (See Comments)    Joint pain   . Hctz [Hydrochlorothiazide] Palpitations and Other (See Comments)    Sweating     ROS Review of Systems    Objective:    Physical Exam  BP (!) 156/90 (BP Location: Left Arm, Patient Position: Sitting, Cuff Size: Normal)   Pulse 78   Temp 98.7 F (37.1 C) (Oral)   Resp 17   Ht 5' 3.5" (1.613 m)   Wt 173 lb (78.5 kg)   SpO2 95%   BMI 30.16 kg/m  Wt Readings from Last 3 Encounters:  06/01/19 173 lb (78.5 kg)  05/28/19 173 lb 6.4 oz (78.7 kg)  01/22/19 170 lb (77.1 kg)    Physical Exam  Constitutional: Oriented to person, place, and time. Appears well-developed and well-nourished.  HENT:  Head: Normocephalic and atraumatic.  Eyes: Conjunctivae and EOM are normal.  Cardiovascular: Normal rate, regular rhythm, normal heart sounds and intact distal pulses.  No murmur heard. Pulmonary/Chest: Effort normal and breath sounds normal. No stridor. No respiratory distress. Has no wheezes.   Neurological: Is alert and oriented to person, place, and time.  Skin: Skin is warm. Capillary refill takes less than 2 seconds.  Psychiatric: Has a normal mood and affect. Behavior is normal. Judgment and thought content normal.    Straight leg raise on the left  positive Log roll negative Patellar reflex 2+ No edema No limb asymmetry No muscle weakness    CLINICAL DATA:  Low back pain with radiation into left leg.  EXAM: LUMBAR SPINE - COMPLETE 4+ VIEW  COMPARISON:  Plain film of the lumbar spine dated 10/02/2016.  FINDINGS: Again noted are mild disc desiccations at the L4-5 and L5-S1 levels, stable. Again noted is the prominent degenerative facet hypertrophy within the lower lumbar spine, stable. Associated 7 mm anterolisthesis of L4 on L5 is stable.  No acute or suspicious osseous finding. Atherosclerotic changes noted along the walls of the infrarenal abdominal aorta.  IMPRESSION: 1. Stable exam. Degenerative changes within the lower lumbar spine, as detailed above. Stable anterolisthesis of L4 on L5 is stable. 2. No acute findings. 3. Aortic atherosclerosis.   Electronically Signed   By: Franki Cabot M.D.   On: 11/13/2017 17:56  CLINICAL DATA:  Chronic shortness of breath. Non-small cell lung cancer.  EXAM: CT CHEST WITH CONTRAST  TECHNIQUE: Multidetector CT imaging of the chest was performed during intravenous contrast administration.  CONTRAST:  93m OMNIPAQUE IOHEXOL 300 MG/ML  SOLN  COMPARISON:  01/09/2019  FINDINGS: Cardiovascular: Normal heart size. No pericardial effusion. Aortic atherosclerosis. Calcifications within the LAD, left circumflex coronary arteries noted.  Mediastinum/Nodes: Normal appearance of the thyroid gland. The trachea appears patent and is midline. Normal appearance of the esophagus. No enlarged mediastinal or hilar lymph nodes. No axillary or supraclavicular adenopathy.  Lungs/Pleura: Moderate changes of  emphysema. Bandlike area of scarring within the posterior left lung base is not significantly changed when compared with the previous exam. Status post right upper lobectomy. Paramediastinal radiation change identified in the right middle lobe and right lower lobe. No suspicious pulmonary nodule or mass identified within the right lung to suggest residual or recurrence of tumor. Stable subpleural nodule in the left upper lobe measuring 5 mm.  Upper Abdomen: No acute abnormality.  Musculoskeletal: No chest wall abnormality. No acute or significant osseous findings. Chronic upper thoracic spine compression deformities are identified and are unchanged. No acute or suspicious osseous abnormality identified.  IMPRESSION: 1. Stable exam. Bandlike area of scarring or chronic atelectasis in the posterior lung base is stable and is likely post infectious or inflammatory in etiology. 2. Stable postoperative and post radiation change in the right lung without evidence for local tumor recurrence or metastatic disease. 3. Aortic Atherosclerosis (ICD10-I70.0) and Emphysema (ICD10-J43.9). Coronary artery calcifications.   Electronically Signed   By: TKerby MoorsM.D.   On: 05/26/2019 11:33    There are no preventive care reminders to display for this patient.  There are no preventive care reminders to display for this patient.  Lab Results  Component Value Date   TSH 1.320 11/21/2018   Lab Results  Component Value Date   WBC 6.6 05/26/2019   HGB 12.6 05/26/2019   HCT 39.8 05/26/2019   MCV 92.6 05/26/2019   PLT 309 05/26/2019   Lab Results  Component Value Date   NA 142 05/26/2019   K 4.5 05/26/2019   CHLORIDE 103 11/11/2017   CO2 29 05/26/2019   GLUCOSE 104 (H) 05/26/2019   BUN 19 05/26/2019   CREATININE 1.24 (H) 05/26/2019   BILITOT 0.3 05/26/2019   ALKPHOS 82 05/26/2019   AST 19 05/26/2019   ALT 16 05/26/2019   PROT 7.0 05/26/2019   ALBUMIN 4.0 05/26/2019    CALCIUM 10.1 05/26/2019   ANIONGAP 10 05/26/2019   EGFR 40 (L) 11/11/2017   Lab Results  Component Value Date   CHOL 163 11/21/2018   Lab Results  Component Value Date   HDL 87 11/21/2018   Lab Results  Component Value Date   LDLCALC 61 11/21/2018   Lab Results  Component Value Date   TRIG 73 11/21/2018   Lab Results  Component Value Date   CHOLHDL 1.9 11/21/2018   No results found for: HGBA1C    Assessment & Plan:   Problem List Items Addressed This Visit      Cardiovascular and Mediastinum   Essential hypertension  - refilled meds for blood pressure bp also impacted by patient's current pain   Relevant Medications   metoprolol succinate (TOPROL-XL) 50 MG 24 hr tablet     Respiratory   COPD GOLD II - prednisone for joint pain will also help copd   Relevant Medications   predniSONE (DELTASONE) 10 MG tablet   Non-small cell carcinoma of right lung, stage 2 ([facility])   Relevant Medications   predniSONE (DELTASONE) 10 MG tablet    Other Visit Diagnoses    Acute left-sided low back pain with left-sided sciatica    -  Primary Advised pt to take prednisone since it is relatively acute She should take tramadol for pain She has arthritis thus will refer to Orthopedics   Relevant Medications   predniSONE (DELTASONE) 10 MG tablet   traMADol (ULTRAM) 50 MG tablet   Other Relevant Orders   DG Lumbar Spine Complete (Completed)   Ambulatory referral to Orthopedic Surgery      Meds ordered this encounter  Medications  . predniSONE (DELTASONE) 10 MG tablet    Sig: Take 6 tablets (60 mg total) by mouth daily with breakfast for 1 day, THEN 5 tablets (50 mg total) daily with breakfast for 1 day, THEN 4 tablets (40 mg total) daily with breakfast for 1 day, THEN 3 tablets (30 mg total) daily with breakfast for 1 day, THEN 2 tablets (20 mg total) daily with breakfast for 1 day, THEN 1 tablet (10 mg total) daily with breakfast for 1 day.    Dispense:  21 tablet    Refill:  0  .  traZODone (DESYREL) 50 MG tablet    Sig: Take 1-2 tablets (50-100 mg total) by mouth at bedtime as needed for sleep.    Dispense:  90 tablet    Refill:  3  . traMADol (ULTRAM) 50 MG tablet    Sig: Take 1 tablet (50 mg total) by mouth every 6 (six) hours as needed (BACK PAIN).    Dispense:  90 tablet    Refill:  0  . metoprolol succinate (TOPROL-XL) 50 MG 24 hr tablet    Sig: Take 1 tablet (50 mg total) by mouth daily. Take with or immediately following a meal.    Dispense:  90 tablet    Refill:  1    Follow-up: No follow-ups on file.    Forrest Moron, MD

## 2019-06-01 NOTE — Patient Instructions (Signed)
° ° ° °  If you have lab work done today you will be contacted with your lab results within the next 2 weeks.  If you have not heard from us then please contact us. The fastest way to get your results is to register for My Chart. ° ° °IF you received an x-ray today, you will receive an invoice from [facility]. Please contact [facility] at [phone] with questions or concerns regarding your invoice.  ° °IF you received labwork today, you will receive an invoice from [facility]. Please contact [facility] at [phone] with questions or concerns regarding your invoice.  ° °Our billing staff will not be able to assist you with questions regarding bills from these companies. ° °You will be contacted with the lab results as soon as they are available. The fastest way to get your results is to activate your My Chart account. Instructions are located on the last page of this paperwork. If you have not heard from us regarding the results in 2 weeks, please contact this office. °  ° ° ° °

## 2019-06-11 ENCOUNTER — Other Ambulatory Visit: Payer: Medicare Other

## 2019-06-13 ENCOUNTER — Other Ambulatory Visit: Payer: Self-pay | Admitting: Family Medicine

## 2019-06-13 NOTE — Telephone Encounter (Signed)
Requested medication (s) are due for refill today: yes  Requested medication (s) are on the active medication list: yes  Last refill:  04/06/19  Future visit scheduled: yes  Notes to clinic:  Medication cannot be delegated to NT to refill   Requested Prescriptions  Pending Prescriptions Disp Refills   tiZANidine (ZANAFLEX) 2 MG tablet [Pharmacy Med Name: TIZANIDINE HCL 2 MG TABLET] 90 tablet 1    Sig: TAKE 1 TABLET BY MOUTH THREE TIMES A DAY AS NEEDED SPASMS     Not Delegated - Cardiovascular:  Alpha-2 Agonists - tizanidine Failed - 06/13/2019  9:46 AM      Failed - This refill cannot be delegated      Passed - Valid encounter within last 6 months    Recent Outpatient Visits          1 week ago Acute left-sided low back pain with left-sided sciatica   Primary Care at [facility], Zoe A, MD   5 months ago Acute nonintractable headache, unspecified headache type   Primary Care at [facility], Arlie Solomons, MD   5 months ago Shortness of breath   Primary Care at [facility], Ellie Lunch, FNP   6 months ago Medicare annual wellness visit, subsequent   Primary Care at [place], MD   6 months ago Essential hypertension   Primary Care at [place], MD      Future Appointments            In 1 month Forrest Moron, MD Primary Care at [place], [facility]

## 2019-06-15 NOTE — Telephone Encounter (Signed)
Pt is asking for for medication

## 2019-06-26 DIAGNOSIS — M4310 Spondylolisthesis, site unspecified: Secondary | ICD-10-CM | POA: Diagnosis not present

## 2019-06-26 DIAGNOSIS — M545 Low back pain: Secondary | ICD-10-CM | POA: Diagnosis not present

## 2019-07-08 ENCOUNTER — Other Ambulatory Visit: Payer: Self-pay | Admitting: Family Medicine

## 2019-07-18 DIAGNOSIS — Z23 Encounter for immunization: Secondary | ICD-10-CM | POA: Diagnosis not present

## 2019-08-03 ENCOUNTER — Ambulatory Visit: Payer: Medicare Other | Admitting: Family Medicine

## 2019-08-12 ENCOUNTER — Other Ambulatory Visit: Payer: Self-pay | Admitting: Family Medicine

## 2019-08-12 NOTE — Telephone Encounter (Signed)
Requested Prescriptions   Pending Prescriptions Disp Refills  . tiZANidine (ZANAFLEX) 2 MG tablet [Pharmacy Med Name: TIZANIDINE HCL 2 MG TABLET] 90 tablet 1    Sig: TAKE 1 TABLET BY MOUTH 3 TIMES A DAY AS NEEDED FOR SPASMS    Last OV 06/01/2019  Last written 06/16/2019

## 2019-08-12 NOTE — Telephone Encounter (Signed)
Requested medication (s) are due for refill today: yes  Requested medication (s) are on the active medication list: yes  Last refill:  07/19/2019  Future visit scheduled: yes  Notes to clinic:  Refill cannot be delegated    Requested Prescriptions  Pending Prescriptions Disp Refills   tiZANidine (ZANAFLEX) 2 MG tablet [Pharmacy Med Name: TIZANIDINE HCL 2 MG TABLET] 90 tablet 1    Sig: TAKE 1 TABLET BY MOUTH 3 TIMES A DAY AS NEEDED FOR SPASMS     Not Delegated - Cardiovascular:  Alpha-2 Agonists - tizanidine Failed - 08/12/2019  1:40 AM      Failed - This refill cannot be delegated      Passed - Valid encounter within last 6 months    Recent Outpatient Visits          2 months ago Acute left-sided low back pain with left-sided sciatica   Primary Care at [facility], Arlie Solomons, MD   5 months ago Essential hypertension   Primary Care at [place], [place] A, MD   7 months ago Acute nonintractable headache, unspecified headache type   Primary Care at [facility], Arlie Solomons, MD   7 months ago Shortness of breath   Primary Care at [facility], Ellie Lunch, FNP   8 months ago Medicare annual wellness visit, subsequent   Primary Care at [place], MD      Future Appointments            In 2 weeks Forrest Moron, MD Primary Care at [place], [facility]

## 2019-08-26 ENCOUNTER — Other Ambulatory Visit: Payer: Self-pay | Admitting: Family Medicine

## 2019-08-26 NOTE — Telephone Encounter (Signed)
Requested medication (s) are due for refill today: yes  Requested medication (s) are on the active medication list: yes  Last refill:  07/15/2019  Future visit scheduled: yes  Notes to clinic:  Script has changed from previous one sent   Requested Prescriptions  Pending Prescriptions Disp Refills   traZODone (DESYREL) 50 MG tablet [Pharmacy Med Name: TRAZODONE 50 MG TABLET] 180 tablet 2    Sig: TAKE 1 TO 2 TABLETS BY MOUTH AT BEDTIME AS NEEDED FOR SLEEP     Psychiatry: Antidepressants - Serotonin Modulator Passed - 08/26/2019  8:32 AM      Passed - Valid encounter within last 6 months    Recent Outpatient Visits          2 months ago Acute left-sided low back pain with left-sided sciatica   Primary Care at [place], MD   6 months ago Essential hypertension   Primary Care at [facility], [place] A, MD   7 months ago Acute nonintractable headache, unspecified headache type   Primary Care at [facility], Arlie Solomons, MD   8 months ago Shortness of breath   Primary Care at [facility], Ellie Lunch, FNP   9 months ago Medicare annual wellness visit, subsequent   Primary Care at [place], MD      Future Appointments            In 6 days Forrest Moron, MD Primary Care at [place], [place] - Completed PHQ-2 or PHQ-9 in the last 360 days.

## 2019-09-01 ENCOUNTER — Encounter: Payer: Self-pay | Admitting: Registered Nurse

## 2019-09-01 ENCOUNTER — Ambulatory Visit (INDEPENDENT_AMBULATORY_CARE_PROVIDER_SITE_OTHER): Payer: Medicare Other | Admitting: Registered Nurse

## 2019-09-01 ENCOUNTER — Other Ambulatory Visit: Payer: Self-pay

## 2019-09-01 VITALS — BP 160/80 | HR 65 | Temp 99.0°F | Resp 16 | Wt 175.0 lb

## 2019-09-01 DIAGNOSIS — I10 Essential (primary) hypertension: Secondary | ICD-10-CM

## 2019-09-01 NOTE — Progress Notes (Signed)
Spoke with pt to schedule appt. She will cb to schedule when she knows her work schedule

## 2019-09-01 NOTE — Progress Notes (Signed)
Established Patient Office Visit  Subjective:  Patient ID: Tammy Ball, female    DOB: 10/16/1941  Age: 78 y.o. MRN: [id number]  CC:  Chief Complaint  Patient presents with  . Hypertension    3 month follow-up     HPI Tammy Ball presents for 3 mo BP follow up per request of her PCP, Dr. Nolon Rod.  States that her BP has experienced some fluctuation - often at home it is 100s/60s, though occasionally readings in 170s/90s. She has had headaches with this rise in BP in the past, but states it has not been happening for some time.   She takes metoprolol '50mg'$  Er PO qd for her HTN with overall good effect.  Upon presentation to our office today her BP is elevated. She is unable to name triggers for elevated BP, but notes that she did get a poor night of sleep last night. She states her home monitor gave her a high reading this morning as well.   Past Medical History:  Diagnosis Date  . Anemia    was while doing chemo  . Anxiety   . Asthma   . Chemotherapy-induced neuropathy ([place]) 11/01/2015  . Claustrophobia   . COPD (chronic obstructive pulmonary disease) ([place])   . Depression   . Encounter for antineoplastic chemotherapy 02/09/2016  . Glaucoma   . Headache    prior to menopause  . Heart murmur   . History of echocardiogram    Echo 2/17: EF 09-32%, grade 1 diastolic dysfunction, mild MR, trivial pericardial effusion  . History of nuclear stress test    Myoview 2/17: no ischemia or scar, EF 79%; low risk  . History of radiation therapy 10/30/16-11/06/16  . Hyperlipidemia   . Hypertension   . Insomnia 05/16/2016  . Non-small cell carcinoma of right lung, stage 2 ([place]) 10/03/2015  . Radiation 02/29/16-04/10/16   50.4 Gy to right central chest  . Renal cell carcinoma ([place])    L nephrectomy  in 2012    Past Surgical History:  Procedure Laterality Date  . BACK SURGERY     cervical 1991  . EYE SURGERY    . IR GENERIC HISTORICAL  01/16/2017   IR RADIOLOGIST EVAL & MGMT  01/16/2017 [facility]  . IR GENERIC HISTORICAL  01/31/2017   IR FLUORO GUIDED NEEDLE PLC ASPIRATION/INJECTION LOC 01/31/2017 Luanne Bras, MD [facility]  . IR GENERIC HISTORICAL  01/31/2017   IR VERTEBROPLASTY CERV/THOR BX INC UNI/BIL INC/INJECT/IMAGING 01/31/2017 Luanne Bras, MD [facility]  . IR GENERIC HISTORICAL  01/31/2017   IR VERTEBROPLASTY EA ADDL (T&LS) BX INC UNI/BIL INC INJECT/IMAGING 01/31/2017 Luanne Bras, MD [facility]  . kidney cancer    . NEPHRECTOMY    . SPINE SURGERY    . TUBAL LIGATION    . VIDEO ASSISTED THORACOSCOPY (VATS)/WEDGE RESECTION Right 01/18/2016   Procedure: VIDEO ASSISTED THORACOSCOPY (VATS)/LUNG RESECTION, THOROCOTOMY, RIGHT UPPER LOBECTOMY, LYMPH NODE DISSECTION, PLACEMENT OF ON Q;  Surgeon: [name], MD;  Location: [place];  Service: Thoracic;  Laterality: Right;  Marland Kitchen VIDEO BRONCHOSCOPY Bilateral 09/20/2015   Procedure: VIDEO BRONCHOSCOPY WITHOUT FLUORO;  Surgeon: Rigoberto Noel, MD;  Location: [facility];  Service: Cardiopulmonary;  Laterality: Bilateral;  . VIDEO BRONCHOSCOPY N/A 01/18/2016   Procedure: VIDEO BRONCHOSCOPY;  Surgeon: [name], MD;  Location: [facility];  Service: Thoracic;  Laterality: N/A;    Family History  Problem Relation Age of Onset  . Heart disease Sister   . Obesity Brother   .  Heart attack Daughter 43       s/p CABG  . Glaucoma Daughter   . Breast cancer Sister   . Birth defects Sister   . Cancer Mother        Bladder Cancer  . Hypertension Mother   . CAD Mother 93  . Cancer Maternal Grandmother     Social History   Socioeconomic History  . Marital status: Widowed    Spouse name: Not on file  . Number of children: 4  . Years of education: Not on file  . Highest education level: High school graduate  Occupational History  . Not on file  Social Needs  . Financial resource strain: Not hard at all  . Food insecurity    Worry: Never true    Inability: Never true  . Transportation needs     Medical: No    Non-medical: No  Tobacco Use  . Smoking status: Former Smoker    Packs/day: 1.00    Years: 50.00    Pack years: 50.00    Types: Cigarettes    Quit date: 03/16/2011    Years since quitting: 8.4  . Smokeless tobacco: Never Used  . Tobacco comment: last 4-5 years of smoking, smoked 0.5 pack/day   Substance and Sexual Activity  . Alcohol use: No    Alcohol/week: 0.0 standard drinks  . Drug use: No  . Sexual activity: Not Currently  Lifestyle  . Physical activity    Days per week: 0 days    Minutes per session: 0 min  . Stress: Only a little  Relationships  . Social connections    Talks on phone: More than three times a week    Gets together: More than three times a week    Attends religious service: More than 4 times per year    Active member of club or organization: Yes    Attends meetings of clubs or organizations: More than 4 times per year    Relationship status: Widowed  . Intimate partner violence    Fear of current or ex partner: No    Emotionally abused: No    Physically abused: No    Forced sexual activity: No  Other Topics Concern  . Not on file  Social History Narrative   Marital status: divorced; not dating in 2019.      Children: 4 children; 3 grandchildren adult; 4 gg.      Lives: alone in house      Employment: part-time work; substance abuse counselor; [employer].      Tobacco: quit smoking 2012; smoked 45 years      Alcohol: none      Drugs: none      Exercise: none in 2019; due to LEFT sciatica.      ADLs: independent with ADLs; drives.       Advanced Directives: YES: HCPOA: Nicholas Martinez/son.  FULL CODE but no prolonged measures.      Occupation: Substance Abuse Estate agent   No exercise** Merged History Encounter **       ** Data from: 12/14/11 Enc Dept: [facility]       ** Data from: 12/17/11 Enc Dept: [facility]   Substance abuse counselor   Husband deceased   4 great  grandchildren   Son works in same substance abuse counseling center as patient    Outpatient Medications Prior to Visit  Medication Sig Dispense Refill  .  acetaminophen (TYLENOL) 500 MG tablet Take 500 mg by mouth every 6 (six) hours as needed for mild pain, moderate pain, fever or headache. Reported on 05/24/2016    . albuterol (PROVENTIL) (2.5 MG/3ML) 0.083% nebulizer solution Take 3 mLs (2.5 mg total) by nebulization every 6 (six) hours as needed for wheezing or shortness of breath. 75 mL 12  . albuterol (VENTOLIN HFA) 108 (90 Base) MCG/ACT inhaler Inhale 1-2 puffs into the lungs every 6 (six) hours as needed for wheezing or shortness of breath. 1 Inhaler 6  . Ascorbic Acid (VITAMIN C) 1000 MG tablet Take 1,000 mg by mouth daily.    Marland Kitchen aspirin EC 81 MG tablet Take 81 mg by mouth daily. Reported on 04/11/2016    . atorvastatin (LIPITOR) 40 MG tablet TAKE 1 TABLET BY MOUTH EVERY DAY 90 tablet 1  . calcium carbonate (TUMS EX) 750 MG chewable tablet Chew 1 tablet by mouth as needed for heartburn.    . Carboxymethylcellulose Sodium (ARTIFICIAL TEARS OP) Place 1 drop into both eyes daily as needed (dry eyes).    . Cholecalciferol (VITAMIN D3 PO) Take 1,200 mg by mouth daily.    . clonazePAM (KLONOPIN) 0.5 MG tablet Take 0.5 tablets (0.25 mg total) by mouth daily as needed for anxiety. 30 tablet 0  . fish oil-omega-3 fatty acids 1000 MG capsule Take 1 capsule by mouth daily.     . fluticasone (FLONASE) 50 MCG/ACT nasal spray Place 2 sprays into both nostrils daily. 16 g 2  . Fluticasone-Umeclidin-Vilant (TRELEGY ELLIPTA) 100-62.5-25 MCG/INH AEPB Inhale 1 puff into the lungs daily. 28 each 3  . metoprolol succinate (TOPROL-XL) 50 MG 24 hr tablet Take 1 tablet (50 mg total) by mouth daily. Take with or immediately following a meal. 90 tablet 1  . OXYGEN Inhale 2 L into the lungs continuous.    . sodium chloride (OCEAN) 0.65 % SOLN nasal spray Place 1 spray into both nostrils as needed for congestion.     Marland Kitchen tiZANidine (ZANAFLEX) 2 MG tablet TAKE 1 TABLET BY MOUTH 3 TIMES A DAY AS NEEDED FOR SPASMS 90 tablet 1  . traMADol (ULTRAM) 50 MG tablet Take 1 tablet (50 mg total) by mouth every 6 (six) hours as needed (BACK PAIN). 90 tablet 0  . traZODone (DESYREL) 50 MG tablet TAKE 1 TO 2 TABLETS BY MOUTH AT BEDTIME AS NEEDED FOR SLEEP 90 tablet 0  . benzonatate (TESSALON) 100 MG capsule Take 1 capsule (100 mg total) by mouth 3 (three) times daily as needed for cough. 20 capsule 0  . guaiFENesin-dextromethorphan (ROBITUSSIN DM) 100-10 MG/5ML syrup Take 5 mLs by mouth every 4 (four) hours as needed for cough (chest congestion). 118 mL 0   Facility-Administered Medications Prior to Visit  Medication Dose Route Frequency Provider Last Rate Last Dose  . acetaminophen (TYLENOL) tablet 500 mg  500 mg Oral Once Forrest Moron, MD        Allergies  Allergen Reactions  . Bee Venom Anaphylaxis, Shortness Of Breath, Swelling and Other (See Comments)    Swelling at site   . Amlodipine Swelling and Other (See Comments)    Swelling of the ankles and hands   . Levofloxacin Other (See Comments)    Joint pain   . Hctz [Hydrochlorothiazide] Palpitations and Other (See Comments)    Sweating     ROS Review of Systems  Constitutional: Negative.   HENT: Negative.   Eyes: Negative.   Respiratory: Positive for shortness of breath (COPD, lung  ca) and wheezing (copd, lung ca).   Cardiovascular: Negative.   Gastrointestinal: Negative.   Endocrine: Negative.   Genitourinary: Negative.   Musculoskeletal: Negative.   Skin: Negative.   Allergic/Immunologic: Negative.   Neurological: Negative.   Hematological: Negative.   Psychiatric/Behavioral: Negative.   All other systems reviewed and are negative.     Objective:    Physical Exam  Constitutional: She is oriented to person, place, and time. She appears well-developed and well-nourished.  Cardiovascular: Normal rate and regular rhythm.  Pulmonary/Chest:  Effort normal. No respiratory distress.  Musculoskeletal:        General: No edema.  Neurological: She is alert and oriented to person, place, and time.  Skin: Skin is warm and dry. No rash noted. No erythema. No pallor.  Psychiatric: She has a normal mood and affect. Her behavior is normal. Judgment and thought content normal.  Nursing note and vitals reviewed.   BP (!) 160/80   Pulse 65   Temp 99 F (37.2 C) (Oral)   Resp 16   Wt 175 lb (79.4 kg)   SpO2 96%   BMI 30.51 kg/m  Wt Readings from Last 3 Encounters:  09/01/19 175 lb (79.4 kg)  06/01/19 173 lb (78.5 kg)  05/28/19 173 lb 6.4 oz (78.7 kg)     There are no preventive care reminders to display for this patient.  There are no preventive care reminders to display for this patient.  Lab Results  Component Value Date   TSH 1.320 11/21/2018   Lab Results  Component Value Date   WBC 6.6 05/26/2019   HGB 12.6 05/26/2019   HCT 39.8 05/26/2019   MCV 92.6 05/26/2019   PLT 309 05/26/2019   Lab Results  Component Value Date   NA 142 05/26/2019   K 4.5 05/26/2019   CHLORIDE 103 11/11/2017   CO2 29 05/26/2019   GLUCOSE 104 (H) 05/26/2019   BUN 19 05/26/2019   CREATININE 1.24 (H) 05/26/2019   BILITOT 0.3 05/26/2019   ALKPHOS 82 05/26/2019   AST 19 05/26/2019   ALT 16 05/26/2019   PROT 7.0 05/26/2019   ALBUMIN 4.0 05/26/2019   CALCIUM 10.1 05/26/2019   ANIONGAP 10 05/26/2019   EGFR 40 (L) 11/11/2017   Lab Results  Component Value Date   CHOL 163 11/21/2018   Lab Results  Component Value Date   HDL 87 11/21/2018   Lab Results  Component Value Date   LDLCALC 61 11/21/2018   Lab Results  Component Value Date   TRIG 73 11/21/2018   Lab Results  Component Value Date   CHOLHDL 1.9 11/21/2018   No results found for: HGBA1C    Assessment & Plan:   Problem List Items Addressed This Visit      Cardiovascular and Mediastinum   Essential hypertension - Primary      No orders of the defined types  were placed in this encounter.   Follow-up: Return in about 3 months (around 12/02/2019).   PLAN  Return in 2 weeks for nurse visit BP check and home cuff calibration  Return in 3 mos for BP check with PCP  If BP elevated in 2 weeks, we will consider PRN antihypertensive  She will keep BP log with potential triggers listed  Patient encouraged to call clinic with any questions, comments, or concerns.   Maximiano Coss, NP

## 2019-09-01 NOTE — Patient Instructions (Signed)
° ° ° °  If you have lab work done today you will be contacted with your lab results within the next 2 weeks.  If you have not heard from us then please contact us. The fastest way to get your results is to register for My Chart. ° ° °IF you received an x-ray today, you will receive an invoice from [facility]. Please contact [facility] at [phone] with questions or concerns regarding your invoice.  ° °IF you received labwork today, you will receive an invoice from [facility]. Please contact [facility] at [phone] with questions or concerns regarding your invoice.  ° °Our billing staff will not be able to assist you with questions regarding bills from these companies. ° °You will be contacted with the lab results as soon as they are available. The fastest way to get your results is to activate your My Chart account. Instructions are located on the last page of this paperwork. If you have not heard from us regarding the results in 2 weeks, please contact this office. °  ° ° ° °

## 2019-09-11 ENCOUNTER — Other Ambulatory Visit: Payer: Self-pay | Admitting: Family Medicine

## 2019-09-11 NOTE — Telephone Encounter (Signed)
Forwarding medication refill request to the clinical pool for review. 

## 2019-10-23 ENCOUNTER — Other Ambulatory Visit: Payer: Self-pay | Admitting: Primary Care

## 2019-11-30 ENCOUNTER — Ambulatory Visit: Payer: Medicare Other | Admitting: Family Medicine

## 2019-12-01 ENCOUNTER — Encounter (HOSPITAL_COMMUNITY): Payer: Self-pay

## 2019-12-01 ENCOUNTER — Ambulatory Visit (HOSPITAL_COMMUNITY)
Admission: RE | Admit: 2019-12-01 | Discharge: 2019-12-01 | Disposition: A | Discharge status: Home / Self Care | Payer: Medicare Other | Source: Ambulatory Visit | Attending: Internal Medicine | Admitting: Internal Medicine

## 2019-12-01 ENCOUNTER — Inpatient Hospital Stay: Payer: Medicare Other | Attending: Internal Medicine

## 2019-12-01 ENCOUNTER — Other Ambulatory Visit: Payer: Self-pay

## 2019-12-01 DIAGNOSIS — Z85528 Personal history of other malignant neoplasm of kidney: Secondary | ICD-10-CM | POA: Diagnosis not present

## 2019-12-01 DIAGNOSIS — Z7982 Long term (current) use of aspirin: Secondary | ICD-10-CM | POA: Insufficient documentation

## 2019-12-01 DIAGNOSIS — G4700 Insomnia, unspecified: Secondary | ICD-10-CM | POA: Insufficient documentation

## 2019-12-01 DIAGNOSIS — Z9221 Personal history of antineoplastic chemotherapy: Secondary | ICD-10-CM | POA: Insufficient documentation

## 2019-12-01 DIAGNOSIS — Z79899 Other long term (current) drug therapy: Secondary | ICD-10-CM | POA: Diagnosis not present

## 2019-12-01 DIAGNOSIS — J449 Chronic obstructive pulmonary disease, unspecified: Secondary | ICD-10-CM | POA: Diagnosis not present

## 2019-12-01 DIAGNOSIS — C3490 Malignant neoplasm of unspecified part of unspecified bronchus or lung: Secondary | ICD-10-CM | POA: Insufficient documentation

## 2019-12-01 DIAGNOSIS — R0602 Shortness of breath: Secondary | ICD-10-CM | POA: Insufficient documentation

## 2019-12-01 DIAGNOSIS — M545 Low back pain: Secondary | ICD-10-CM | POA: Diagnosis not present

## 2019-12-01 DIAGNOSIS — E785 Hyperlipidemia, unspecified: Secondary | ICD-10-CM | POA: Diagnosis not present

## 2019-12-01 DIAGNOSIS — F418 Other specified anxiety disorders: Secondary | ICD-10-CM | POA: Insufficient documentation

## 2019-12-01 DIAGNOSIS — I10 Essential (primary) hypertension: Secondary | ICD-10-CM | POA: Insufficient documentation

## 2019-12-01 DIAGNOSIS — C3411 Malignant neoplasm of upper lobe, right bronchus or lung: Secondary | ICD-10-CM | POA: Insufficient documentation

## 2019-12-01 LAB — CMP (CANCER CENTER ONLY)
ALT: 23 U/L (ref 0–44)
AST: 25 U/L (ref 15–41)
Albumin: 4.3 g/dL (ref 3.5–5.0)
Alkaline Phosphatase: 98 U/L (ref 38–126)
Anion gap: 10 (ref 5–15)
BUN: 19 mg/dL (ref 8–23)
CO2: 28 mmol/L (ref 22–32)
Calcium: 9.7 mg/dL (ref 8.9–10.3)
Chloride: 103 mmol/L (ref 98–111)
Creatinine: 1.14 mg/dL — ABNORMAL HIGH (ref 0.44–1.00)
GFR, Est AFR Am: 54 mL/min — ABNORMAL LOW (ref >60)
GFR, Estimated: 47 mL/min — ABNORMAL LOW (ref >60)
Glucose, Bld: 110 mg/dL — ABNORMAL HIGH (ref 70–99)
Potassium: 4.3 mmol/L (ref 3.5–5.1)
Sodium: 141 mmol/L (ref 135–145)
Total Bilirubin: 0.6 mg/dL (ref 0.3–1.2)
Total Protein: 7.4 g/dL (ref 6.5–8.1)

## 2019-12-01 LAB — CBC WITH DIFFERENTIAL (CANCER CENTER ONLY)
Abs Immature Granulocytes: 0.02 10*3/uL (ref 0.00–0.07)
Basophils Absolute: 0.1 10*3/uL (ref 0.0–0.1)
Basophils Relative: 1 %
Eosinophils Absolute: 0.1 10*3/uL (ref 0.0–0.5)
Eosinophils Relative: 2 %
HCT: 43.5 % (ref 36.0–46.0)
Hemoglobin: 13.8 g/dL (ref 12.0–15.0)
Immature Granulocytes: 0 %
Lymphocytes Relative: 22 %
Lymphs Abs: 1.9 10*3/uL (ref 0.7–4.0)
MCH: 29.4 pg (ref 26.0–34.0)
MCHC: 31.7 g/dL (ref 30.0–36.0)
MCV: 92.6 fL (ref 80.0–100.0)
Monocytes Absolute: 0.7 10*3/uL (ref 0.1–1.0)
Monocytes Relative: 8 %
Neutro Abs: 5.6 10*3/uL (ref 1.7–7.7)
Neutrophils Relative %: 67 %
Platelet Count: 288 10*3/uL (ref 150–400)
RBC: 4.7 MIL/uL (ref 3.87–5.11)
RDW: 17.6 % — ABNORMAL HIGH (ref 11.5–15.5)
WBC Count: 8.3 10*3/uL (ref 4.0–10.5)
nRBC: 0 % (ref 0.0–0.2)

## 2019-12-03 ENCOUNTER — Other Ambulatory Visit: Payer: Self-pay

## 2019-12-03 ENCOUNTER — Inpatient Hospital Stay (HOSPITAL_BASED_OUTPATIENT_CLINIC_OR_DEPARTMENT_OTHER): Payer: Medicare Other | Admitting: Internal Medicine

## 2019-12-03 ENCOUNTER — Encounter: Payer: Self-pay | Admitting: Internal Medicine

## 2019-12-03 VITALS — BP 160/98 | HR 80 | Temp 98.0°F | Resp 16 | Ht 63.5 in | Wt 178.4 lb

## 2019-12-03 DIAGNOSIS — C3411 Malignant neoplasm of upper lobe, right bronchus or lung: Secondary | ICD-10-CM | POA: Diagnosis not present

## 2019-12-03 DIAGNOSIS — I10 Essential (primary) hypertension: Secondary | ICD-10-CM | POA: Diagnosis not present

## 2019-12-03 DIAGNOSIS — C3491 Malignant neoplasm of unspecified part of right bronchus or lung: Secondary | ICD-10-CM | POA: Diagnosis not present

## 2019-12-03 DIAGNOSIS — R0602 Shortness of breath: Secondary | ICD-10-CM | POA: Diagnosis not present

## 2019-12-03 DIAGNOSIS — F418 Other specified anxiety disorders: Secondary | ICD-10-CM | POA: Diagnosis not present

## 2019-12-03 DIAGNOSIS — C3490 Malignant neoplasm of unspecified part of unspecified bronchus or lung: Secondary | ICD-10-CM

## 2019-12-03 DIAGNOSIS — M545 Low back pain: Secondary | ICD-10-CM | POA: Diagnosis not present

## 2019-12-03 DIAGNOSIS — J449 Chronic obstructive pulmonary disease, unspecified: Secondary | ICD-10-CM | POA: Diagnosis not present

## 2019-12-03 DIAGNOSIS — E785 Hyperlipidemia, unspecified: Secondary | ICD-10-CM | POA: Diagnosis not present

## 2019-12-03 MED ORDER — CLONIDINE HCL 0.1 MG PO TABS: 0.2000 mg | ORAL_TABLET | Freq: Once | ORAL | Status: DC

## 2019-12-03 NOTE — Progress Notes (Signed)
[place] [phone]   [phone]  OFFICE PROGRESS NOTE  Forrest Moron, MD [address] [place] [address]  DIAGNOSIS: Stage IIIA (T2a, N2, M0) non-small cell lung cancer, squamous cell carcinoma presented with large right upper lobe lung mass with right hilar involvement diagnosed in November 2016.  PRIOR THERAPY:  1) Neoadjuvant systemic chemotherapy with carboplatin for AUC of 6 and paclitaxel 200 MG/M2 every 3 weeks, status post 3 cycles with partial response. 2) Video bronchoscopy, right video-assisted thoracoscopy, minithoracotomy, right upper lobectomy with lymph node dissection on 01/18/2016. The final pathology revealed residual squamous cell carcinoma measuring 3.1 cm with close resection margin and metastatic carcinoma to lymph nodes at levels 10R, 12R and 4R. (ypT2a, yN2).  3) Concurrent chemoradiation with weekly carboplatin for AUC of 2 and paclitaxel 45 MG/M2. First dose 02/27/2016. Status post 5 cycles. Last dose was given 03/26/2016. 4) SBRT to left lower lobe lung nodule under the care of Dr. Sondra Come completed on 11/06/2016.  CURRENT THERAPY: Observation.  INTERVAL HISTORY: Tammy Ball 79 y.o. female returns to the clinic today for follow-up visit.  The patient is feeling fine today with no concerning complaints except for shortness of breath with exertion and chronic back pain.  She denied having any current chest pain, cough or hemoptysis.  She denied having any fever or chills.  She has no nausea, vomiting, diarrhea or constipation.  She has no headache or visual changes.  She is here today for evaluation with repeat CT scan of the chest for restaging of her disease.  MEDICAL HISTORY: Past Medical History:  Diagnosis Date  . Anemia    was while doing chemo  . Anxiety   . Asthma   . Chemotherapy-induced neuropathy ([facility]) 11/01/2015  . Claustrophobia   . COPD (chronic obstructive pulmonary disease) ([facility])   .  Depression   . Encounter for antineoplastic chemotherapy 02/09/2016  . Glaucoma   . Headache    prior to menopause  . Heart murmur   . History of echocardiogram    Echo 2/17: EF 00-76%, grade 1 diastolic dysfunction, mild MR, trivial pericardial effusion  . History of nuclear stress test    Myoview 2/17: no ischemia or scar, EF 79%; low risk  . History of radiation therapy 10/30/16-11/06/16  . Hyperlipidemia   . Hypertension   . Insomnia 05/16/2016  . Non-small cell carcinoma of right lung, stage 2 ([facility]) 10/03/2015  . Radiation 02/29/16-04/10/16   50.4 Gy to right central chest  . Renal cell carcinoma ([facility])    L nephrectomy  in 2012    ALLERGIES:  is allergic to bee venom; amlodipine; levofloxacin; and hctz [hydrochlorothiazide].  MEDICATIONS:  Current Outpatient Medications  Medication Sig Dispense Refill  . acetaminophen (TYLENOL) 500 MG tablet Take 500 mg by mouth every 6 (six) hours as needed for mild pain, moderate pain, fever or headache. Reported on 05/24/2016    . albuterol (PROVENTIL) (2.5 MG/3ML) 0.083% nebulizer solution Take 3 mLs (2.5 mg total) by nebulization every 6 (six) hours as needed for wheezing or shortness of breath. 75 mL 12  . albuterol (VENTOLIN HFA) 108 (90 Base) MCG/ACT inhaler Inhale 1-2 puffs into the lungs every 6 (six) hours as needed for wheezing or shortness of breath. 1 Inhaler 6  . Ascorbic Acid (VITAMIN C) 1000 MG tablet Take 1,000 mg by mouth daily.    Marland Kitchen aspirin EC 81 MG tablet Take 81 mg by mouth daily. Reported on  04/11/2016    . atorvastatin (LIPITOR) 40 MG tablet TAKE 1 TABLET BY MOUTH EVERY DAY 90 tablet 1  . calcium carbonate (TUMS EX) 750 MG chewable tablet Chew 1 tablet by mouth as needed for heartburn.    . Carboxymethylcellulose Sodium (ARTIFICIAL TEARS OP) Place 1 drop into both eyes daily as needed (dry eyes).    . Cholecalciferol (VITAMIN D3 PO) Take 1,200 mg by mouth daily.    . clonazePAM (KLONOPIN) 0.5 MG tablet Take 0.5 tablets (0.25  mg total) by mouth daily as needed for anxiety. 30 tablet 0  . fish oil-omega-3 fatty acids 1000 MG capsule Take 1 capsule by mouth daily.     . fluticasone (FLONASE) 50 MCG/ACT nasal spray Place 2 sprays into both nostrils daily. 16 g 2  . metoprolol succinate (TOPROL-XL) 50 MG 24 hr tablet Take 1 tablet (50 mg total) by mouth daily. Take with or immediately following a meal. 90 tablet 1  . OXYGEN Inhale 2 L into the lungs continuous.    . sodium chloride (OCEAN) 0.65 % SOLN nasal spray Place 1 spray into both nostrils as needed for congestion.    Marland Kitchen tiZANidine (ZANAFLEX) 2 MG tablet TAKE 1 TABLET BY MOUTH 3 TIMES A DAY AS NEEDED FOR SPASMS 90 tablet 1  . traMADol (ULTRAM) 50 MG tablet Take 1 tablet (50 mg total) by mouth every 6 (six) hours as needed (BACK PAIN). 90 tablet 0  . traZODone (DESYREL) 50 MG tablet TAKE 1 TO 2 TABLETS BY MOUTH AT BEDTIME AS NEEDED FOR SLEEP 90 tablet 0  . TRELEGY ELLIPTA 100-62.5-25 MCG/INH AEPB TAKE 1 PUFF BY MOUTH EVERY DAY 60 each 3   Current Facility-Administered Medications  Medication Dose Route Frequency Provider Last Rate Last Admin  . acetaminophen (TYLENOL) tablet 500 mg  500 mg Oral Once Forrest Moron, MD        SURGICAL HISTORY:  Past Surgical History:  Procedure Laterality Date  . BACK SURGERY     cervical 1991  . EYE SURGERY    . IR GENERIC HISTORICAL  01/16/2017   IR RADIOLOGIST EVAL & MGMT 01/16/2017 [facility]  . IR GENERIC HISTORICAL  01/31/2017   IR FLUORO GUIDED NEEDLE PLC ASPIRATION/INJECTION LOC 01/31/2017 Luanne Bras, MD [facility]  . IR GENERIC HISTORICAL  01/31/2017   IR VERTEBROPLASTY CERV/THOR BX INC UNI/BIL INC/INJECT/IMAGING 01/31/2017 Luanne Bras, MD [facility]  . IR GENERIC HISTORICAL  01/31/2017   IR VERTEBROPLASTY EA ADDL (T&LS) BX INC UNI/BIL INC INJECT/IMAGING 01/31/2017 Luanne Bras, MD [facility]  . kidney cancer    . NEPHRECTOMY    . SPINE SURGERY    . TUBAL LIGATION    . VIDEO ASSISTED  THORACOSCOPY (VATS)/WEDGE RESECTION Right 01/18/2016   Procedure: VIDEO ASSISTED THORACOSCOPY (VATS)/LUNG RESECTION, THOROCOTOMY, RIGHT UPPER LOBECTOMY, LYMPH NODE DISSECTION, PLACEMENT OF ON Q;  Surgeon: [name], MD;  Location: [facility];  Service: Thoracic;  Laterality: Right;  Marland Kitchen VIDEO BRONCHOSCOPY Bilateral 09/20/2015   Procedure: VIDEO BRONCHOSCOPY WITHOUT FLUORO;  Surgeon: Rigoberto Noel, MD;  Location: [facility];  Service: Cardiopulmonary;  Laterality: Bilateral;  . VIDEO BRONCHOSCOPY N/A 01/18/2016   Procedure: VIDEO BRONCHOSCOPY;  Surgeon: [name], MD;  Location: [facility];  Service: Thoracic;  Laterality: N/A;    REVIEW OF SYSTEMS:  A comprehensive review of systems was negative except for: Respiratory: positive for dyspnea on exertion Musculoskeletal: positive for back pain   PHYSICAL EXAMINATION: General appearance: alert, cooperative and no distress Head:  Normocephalic, without obvious abnormality, atraumatic Neck: no adenopathy, no JVD, supple, symmetrical, trachea midline and thyroid not enlarged, symmetric, no tenderness/mass/nodules Lymph nodes: Cervical, supraclavicular, and axillary nodes normal. Resp: clear to auscultation bilaterally Back: symmetric, no curvature. ROM normal. No CVA tenderness. Cardio: regular rate and rhythm, S1, S2 normal, no murmur, click, rub or gallop GI: soft, non-tender; bowel sounds normal; no masses,  no organomegaly Extremities: extremities normal, atraumatic, no cyanosis or edema  ECOG PERFORMANCE STATUS: 1 - Symptomatic but completely ambulatory  Blood pressure (!) 197/105, pulse 80, temperature 98 F (36.7 C), temperature source Temporal, resp. rate 16, height 5' 3.5" (1.613 m), weight 178 lb 6.4 oz (80.9 kg), SpO2 95 %.  LABORATORY DATA: Lab Results  Component Value Date   WBC 8.3 12/01/2019   HGB 13.8 12/01/2019   HCT 43.5 12/01/2019   MCV 92.6 12/01/2019   PLT 288 12/01/2019      Chemistry      Component Value  Date/Time   NA 141 12/01/2019 0854   NA 140 11/21/2018 1058   NA 140 11/11/2017 0939   K 4.3 12/01/2019 0854   K 4.6 11/11/2017 0939   CL 103 12/01/2019 0854   CO2 28 12/01/2019 0854   CO2 28 11/11/2017 0939   BUN 19 12/01/2019 0854   BUN 23 11/21/2018 1058   BUN 30.3 (H) 11/11/2017 0939   CREATININE 1.14 (H) 12/01/2019 0854   CREATININE 1.3 (H) 11/11/2017 0939   GLU 105 12/10/2009 0000      Component Value Date/Time   CALCIUM 9.7 12/01/2019 0854   CALCIUM 10.0 11/11/2017 0939   ALKPHOS 98 12/01/2019 0854   ALKPHOS 102 11/11/2017 0939   AST 25 12/01/2019 0854   AST 18 11/11/2017 0939   ALT 23 12/01/2019 0854   ALT 19 11/11/2017 0939   BILITOT 0.6 12/01/2019 0854   BILITOT 0.42 11/11/2017 0939       RADIOGRAPHIC STUDIES: CT Chest Wo Contrast  Result Date: 12/01/2019 CLINICAL DATA:  Non-small cell lung cancer, history of radiotherapy and chemotherapy. Also post left nephrectomy for renal cell carcinoma. EXAM: CT CHEST WITHOUT CONTRAST TECHNIQUE: Multidetector CT imaging of the chest was performed following the standard protocol without IV contrast. COMPARISON:  Multiple prior studies available for comparison most recent of 05/26/2019 FINDINGS: Cardiovascular: Moderate atherosclerotic changes throughout the thoracic aorta. No signs of aneurysm, ectatic descending thoracic aorta at 2.9 cm. Dilated central pulmonary vasculature up to 3.2 cm. Heart size normal without pericardial effusion. Mediastinum/Nodes: No signs of adenopathy in the chest. Esophagus is normal by CT. Thoracic inlet structures are normal. Lungs/Pleura: Left lower lobe opacity measuring 3.3 x 2.3 cm, previously 3.2 by 2.2 cm. Airways are patent. Store shin of right hilum following right upper lobectomy. Background pulmonary emphysema similar to previous study. No signs of effusion or new, concerning pulmonary nodule or mass. Upper Abdomen: No acute findings in the upper abdomen. Musculoskeletal: Signs of cervical spinal  fusion and previous upper thoracic vertebroplasty as before. Compression fractures at the site of vertebroplasty without change. IMPRESSION: Stable bandlike area of presumed juxta diaphragmatic scarring at the left lung base, attention on follow-up. Emphysema and signs of previous right upper lobectomy. No signs of new disease. Signs of previous vertebroplasty and compression fractures as before. Aortic Atherosclerosis (ICD10-I70.0) and Emphysema (ICD10-J43.9). Electronically Signed   By: Zetta Bills M.D.   On: 12/01/2019 09:40    ASSESSMENT AND PLAN:  This is a very pleasant 79 years old white female with a stage  IIIa non-small cell lung cancer, squamous cell carcinoma status post neoadjuvant systemic chemotherapy was carboplatin and paclitaxel with partial response followed by right upper lobectomy and lymph node dissection. She was found to have evidence of metastatic disease to the mediastinal lymph nodes and close resection margin. The patient underwent a course of concurrent chemoradiation with weekly carboplatin and paclitaxel. She was also treated with SBRT left lower lobe pulmonary nodule under the care of Dr. Sondra Come. The patient has been on observation since that time and she is feeling fine today with no concerning complaints except for the baseline shortness of breath and chronic back pain secondary to compression fractures. She had repeat CT scan of the chest performed recently.  I personally and independently reviewed the scans and discussed the results with the patient today. Her scan showed no concerning findings for disease progression. I recommended for the patient to continue on observation with repeat CT scan of the chest in 6 months. She was advised to call immediately if she has any concerning symptoms in the interval. The patient voices understanding of current disease status and treatment options and is in agreement with the current care plan. All questions were answered. The  patient knows to call the clinic with any problems, questions or concerns. We can certainly see the patient much sooner if necessary.  Disclaimer: This note was dictated with voice recognition software. Similar sounding words can inadvertently be transcribed and may not be corrected upon review.

## 2019-12-04 ENCOUNTER — Telehealth: Payer: Self-pay | Admitting: Internal Medicine

## 2019-12-04 NOTE — Telephone Encounter (Signed)
Scheduled per los. Called and left msg. Mailed printout  °

## 2019-12-05 ENCOUNTER — Encounter: Payer: Self-pay | Admitting: Family Medicine

## 2019-12-06 NOTE — Progress Notes (Signed)
Established Patient Office Visit  Subjective:  Patient ID: Tammy Ball, female    DOB: 1941/02/11  Age: 79 y.o. MRN: [id number]  CC:  Chief Complaint  Patient presents with  . Medical Management of Chronic Issues    3 month recheck.  Pt has concerns about blood pressure, bp at cancer doctor visit last week was 200/108 but it did eventually go done    HPI Tammy Ball presents for   Patient sees Dr. Julien Nordmann for lung cancer At the last visit on 12/03/2019 her blood pressure was high at Oncology. Her creatinine was 1.14. Her only bp medication is metoprolol.  Her home bp readings are all over the place. Her BP cuff is 79 years old.  She is a DASH diet She denies chest pain, palpations or lower extremity edema BP Readings from Last 3 Encounters:  12/07/19 (!) 163/92  12/03/19 (!) 160/98  09/01/19 (!) 160/80    The patient reports that she is interested in the COVID vaccine and has been calling the appointment line. She has risk factors of age, cancer diagnosis on chemo, uncontrolled hypertension and obesity  Lab Results  Component Value Date   CREATININE 1.14 (H) 12/01/2019   Insomnia She reports that she takes trazadone for sleep Every once in a while when she can't sleep due to anxiety and her cancer she takes a klonopin One prescription lasts her a year She would like a refill Denies alcohol use  Past Medical History:  Diagnosis Date  . Anemia    was while doing chemo  . Anxiety   . Asthma   . Chemotherapy-induced neuropathy ([place]) 11/01/2015  . Claustrophobia   . COPD (chronic obstructive pulmonary disease) ([place])   . Depression   . Encounter for antineoplastic chemotherapy 02/09/2016  . Glaucoma   . Headache    prior to menopause  . Heart murmur   . History of echocardiogram    Echo 2/17: EF 85-27%, grade 1 diastolic dysfunction, mild MR, trivial pericardial effusion  . History of nuclear stress test    Myoview 2/17: no ischemia or scar, EF 79%; low risk    . History of radiation therapy 10/30/16-11/06/16  . Hyperlipidemia   . Hypertension   . Insomnia 05/16/2016  . Non-small cell carcinoma of right lung, stage 2 ([place]) 10/03/2015  . Radiation 02/29/16-04/10/16   50.4 Gy to right central chest  . Renal cell carcinoma ([place])    L nephrectomy  in 2012    Past Surgical History:  Procedure Laterality Date  . BACK SURGERY     cervical 1991  . EYE SURGERY    . IR GENERIC HISTORICAL  01/16/2017   IR RADIOLOGIST EVAL & MGMT 01/16/2017 [facility]  . IR GENERIC HISTORICAL  01/31/2017   IR FLUORO GUIDED NEEDLE PLC ASPIRATION/INJECTION LOC 01/31/2017 Luanne Bras, MD [facility]  . IR GENERIC HISTORICAL  01/31/2017   IR VERTEBROPLASTY CERV/THOR BX INC UNI/BIL INC/INJECT/IMAGING 01/31/2017 Luanne Bras, MD [facility]  . IR GENERIC HISTORICAL  01/31/2017   IR VERTEBROPLASTY EA ADDL (T&LS) BX INC UNI/BIL INC INJECT/IMAGING 01/31/2017 Luanne Bras, MD [facility]  . kidney cancer    . NEPHRECTOMY    . SPINE SURGERY    . TUBAL LIGATION    . VIDEO ASSISTED THORACOSCOPY (VATS)/WEDGE RESECTION Right 01/18/2016   Procedure: VIDEO ASSISTED THORACOSCOPY (VATS)/LUNG RESECTION, THOROCOTOMY, RIGHT UPPER LOBECTOMY, LYMPH NODE DISSECTION, PLACEMENT OF ON Q;  Surgeon: [name], MD;  Location: [place];  Service:  Thoracic;  Laterality: Right;  Marland Kitchen VIDEO BRONCHOSCOPY Bilateral 09/20/2015   Procedure: VIDEO BRONCHOSCOPY WITHOUT FLUORO;  Surgeon: Rigoberto Noel, MD;  Location: [facility];  Service: Cardiopulmonary;  Laterality: Bilateral;  . VIDEO BRONCHOSCOPY N/A 01/18/2016   Procedure: VIDEO BRONCHOSCOPY;  Surgeon: [name], MD;  Location: [facility];  Service: Thoracic;  Laterality: N/A;    Family History  Problem Relation Age of Onset  . Heart disease Sister   . Obesity Brother   . Heart attack Daughter 10       s/p CABG  . Glaucoma Daughter   . Breast cancer Sister   . Birth defects Sister   . Cancer Mother        Bladder Cancer  .  Hypertension Mother   . CAD Mother 58  . Cancer Maternal Grandmother     Social History   Socioeconomic History  . Marital status: Widowed    Spouse name: Not on file  . Number of children: 4  . Years of education: Not on file  . Highest education level: High school graduate  Occupational History  . Not on file  Tobacco Use  . Smoking status: Former Smoker    Packs/day: 1.00    Years: 50.00    Pack years: 50.00    Types: Cigarettes    Quit date: 03/16/2011    Years since quitting: 8.7  . Smokeless tobacco: Never Used  . Tobacco comment: last 4-5 years of smoking, smoked 0.5 pack/day   Substance and Sexual Activity  . Alcohol use: No    Alcohol/week: 0.0 standard drinks  . Drug use: No  . Sexual activity: Not Currently  Other Topics Concern  . Not on file  Social History Narrative   Marital status: divorced; not dating in 2019.      Children: 4 children; 3 grandchildren adult; 4 gg.      Lives: alone in house      Employment: part-time work; substance abuse counselor; [employer].      Tobacco: quit smoking 2012; smoked 45 years      Alcohol: none      Drugs: none      Exercise: none in 2019; due to LEFT sciatica.      ADLs: independent with ADLs; drives.       Advanced Directives: YES: HCPOA: Nicholas Martinez/son.  FULL CODE but no prolonged measures.      Occupation: Substance Abuse Estate agent   No exercise** Merged History Encounter **       ** Data from: 12/14/11 Enc Dept: [facility]       ** Data from: 12/17/11 Enc Dept: [facility]   Substance abuse counselor   Husband deceased   4 great grandchildren   Son works in same substance abuse counseling center as patient   Social Determinants of Radio broadcast assistant Strain:   . Difficulty of Paying Living Expenses: Not on file  Food Insecurity:   . Worried About Charity fundraiser in the Last Year: Not on file  . Ran Out of Food in the Last Year:  Not on file  Transportation Needs:   . Lack of Transportation (Medical): Not on file  . Lack of Transportation (Non-Medical): Not on file  Physical Activity:   . Days of Exercise per Week: Not on file  . Minutes of Exercise per Session: Not on file  Stress:   . Feeling of  Stress : Not on file  Social Connections:   . Frequency of Communication with Friends and Family: Not on file  . Frequency of Social Gatherings with Friends and Family: Not on file  . Attends Religious Services: Not on file  . Active Member of Clubs or Organizations: Not on file  . Attends Archivist Meetings: Not on file  . Marital Status: Not on file  Intimate Partner Violence:   . Fear of Current or Ex-Partner: Not on file  . Emotionally Abused: Not on file  . Physically Abused: Not on file  . Sexually Abused: Not on file    Outpatient Medications Prior to Visit  Medication Sig Dispense Refill  . acetaminophen (TYLENOL) 500 MG tablet Take 500 mg by mouth every 6 (six) hours as needed for mild pain, moderate pain, fever or headache. Reported on 05/24/2016    . albuterol (PROVENTIL) (2.5 MG/3ML) 0.083% nebulizer solution Take 3 mLs (2.5 mg total) by nebulization every 6 (six) hours as needed for wheezing or shortness of breath. 75 mL 12  . albuterol (VENTOLIN HFA) 108 (90 Base) MCG/ACT inhaler Inhale 1-2 puffs into the lungs every 6 (six) hours as needed for wheezing or shortness of breath. 1 Inhaler 6  . Ascorbic Acid (VITAMIN C) 1000 MG tablet Take 1,000 mg by mouth daily.    Marland Kitchen aspirin EC 81 MG tablet Take 81 mg by mouth daily. Reported on 04/11/2016    . atorvastatin (LIPITOR) 40 MG tablet TAKE 1 TABLET BY MOUTH EVERY DAY 90 tablet 1  . calcium carbonate (TUMS EX) 750 MG chewable tablet Chew 1 tablet by mouth as needed for heartburn.    . Carboxymethylcellulose Sodium (ARTIFICIAL TEARS OP) Place 1 drop into both eyes daily as needed (dry eyes).    . Cholecalciferol (VITAMIN D3 PO) Take 1,200 mg by mouth  daily.    . clonazePAM (KLONOPIN) 0.5 MG tablet Take 0.5 tablets (0.25 mg total) by mouth daily as needed for anxiety. 30 tablet 0  . fish oil-omega-3 fatty acids 1000 MG capsule Take 1 capsule by mouth daily.     . fluticasone (FLONASE) 50 MCG/ACT nasal spray Place 2 sprays into both nostrils daily. 16 g 2  . metoprolol succinate (TOPROL-XL) 50 MG 24 hr tablet Take 1 tablet (50 mg total) by mouth daily. Take with or immediately following a meal. 90 tablet 1  . OXYGEN Inhale 2 L into the lungs continuous.    . sodium chloride (OCEAN) 0.65 % SOLN nasal spray Place 1 spray into both nostrils as needed for congestion.    Marland Kitchen tiZANidine (ZANAFLEX) 2 MG tablet TAKE 1 TABLET BY MOUTH 3 TIMES A DAY AS NEEDED FOR SPASMS 90 tablet 1  . traMADol (ULTRAM) 50 MG tablet Take 1 tablet (50 mg total) by mouth every 6 (six) hours as needed (BACK PAIN). 90 tablet 0  . traZODone (DESYREL) 50 MG tablet TAKE 1 TO 2 TABLETS BY MOUTH AT BEDTIME AS NEEDED FOR SLEEP 90 tablet 0  . TRELEGY ELLIPTA 100-62.5-25 MCG/INH AEPB TAKE 1 PUFF BY MOUTH EVERY DAY 60 each 3  . zinc gluconate 50 MG tablet Take 50 mg by mouth daily.     Facility-Administered Medications Prior to Visit  Medication Dose Route Frequency Provider Last Rate Last Admin  . acetaminophen (TYLENOL) tablet 500 mg  500 mg Oral Once Forrest Moron, MD        Allergies  Allergen Reactions  . Bee Venom Anaphylaxis, Shortness Of Breath, Swelling  and Other (See Comments)    Swelling at site   . Amlodipine Swelling and Other (See Comments)    Swelling of the ankles and hands   . Levofloxacin Other (See Comments)    Joint pain   . Hctz [Hydrochlorothiazide] Palpitations and Other (See Comments)    Sweating     ROS Review of Systems Review of Systems  Constitutional: Negative for activity change, appetite change, chills and fever.  HENT: Negative for congestion, nosebleeds, trouble swallowing and voice change.   Respiratory: Negative for cough, shortness  of breath and wheezing.   Gastrointestinal: Negative for diarrhea, nausea and vomiting.  Genitourinary: Negative for difficulty urinating, dysuria, flank pain and hematuria.  Musculoskeletal: Negative for back pain, joint swelling and neck pain.  Neurological: Negative for dizziness, speech difficulty, light-headedness and numbness.  See HPI. All other review of systems negative.     Objective:    Physical Exam  BP (!) 163/92 (BP Location: Left Arm, Patient Position: Sitting, Cuff Size: Large)   Pulse 84   Temp 97.6 F (36.4 C) (Oral)   Resp 17   Ht 5' 3.5" (1.613 m)   Wt 178 lb 3.2 oz (80.8 kg)   SpO2 93%   BMI 31.07 kg/m  Wt Readings from Last 3 Encounters:  12/07/19 178 lb 3.2 oz (80.8 kg)  12/03/19 178 lb 6.4 oz (80.9 kg)  09/01/19 175 lb (79.4 kg)   Physical Exam  Constitutional: Oriented to person, place, and time. Appears well-developed and well-nourished.  HENT:  Head: Normocephalic and atraumatic.  Eyes: Conjunctivae and EOM are normal.  Cardiovascular: Normal rate, regular rhythm, normal heart sounds and intact distal pulses.  No murmur heard. Pulmonary/Chest: Effort normal and breath sounds normal. No stridor. No respiratory distress. Has no wheezes.  Neurological: Is alert and oriented to person, place, and time.  Skin: Skin is warm. Capillary refill takes less than 2 seconds.  Psychiatric: Has a normal mood and affect. Behavior is normal. Judgment and thought content normal.    There are no preventive care reminders to display for this patient.  There are no preventive care reminders to display for this patient.  Lab Results  Component Value Date   TSH 1.320 11/21/2018   Lab Results  Component Value Date   WBC 8.3 12/01/2019   HGB 13.8 12/01/2019   HCT 43.5 12/01/2019   MCV 92.6 12/01/2019   PLT 288 12/01/2019   Lab Results  Component Value Date   NA 141 12/01/2019   K 4.3 12/01/2019   CHLORIDE 103 11/11/2017   CO2 28 12/01/2019   GLUCOSE 110  (H) 12/01/2019   BUN 19 12/01/2019   CREATININE 1.14 (H) 12/01/2019   BILITOT 0.6 12/01/2019   ALKPHOS 98 12/01/2019   AST 25 12/01/2019   ALT 23 12/01/2019   PROT 7.4 12/01/2019   ALBUMIN 4.3 12/01/2019   CALCIUM 9.7 12/01/2019   ANIONGAP 10 12/01/2019   EGFR 40 (L) 11/11/2017   Lab Results  Component Value Date   CHOL 163 11/21/2018   Lab Results  Component Value Date   HDL 87 11/21/2018   Lab Results  Component Value Date   LDLCALC 61 11/21/2018   Lab Results  Component Value Date   TRIG 73 11/21/2018   Lab Results  Component Value Date   CHOLHDL 1.9 11/21/2018   No results found for: HGBA1C    Assessment & Plan:   Problem List Items Addressed This Visit      Respiratory  Non-small cell carcinoma of right lung, stage 2 ([place])  - continue with Dr. Julien Nordmann    Other Visit Diagnoses    Uncontrolled hypertension    -  Primary Discussed her previous reaction to bp meds amlodipine and hctz Will use losartan 9m to help bring down pressure [facility] Will monitor renal function    Relevant Medications   losartan (COZAAR) 50 MG tablet   High priority for 2019-nCoV vaccine    -  Discussed vaccine, pt is a candidate      Meds ordered this encounter  Medications  . losartan (COZAAR) 50 MG tablet    Sig: Take 1 tablet (50 mg total) by mouth daily.    Dispense:  90 tablet    Refill:  1    Follow-up: No follow-ups on file.    ZForrest Moron MD

## 2019-12-07 ENCOUNTER — Ambulatory Visit (INDEPENDENT_AMBULATORY_CARE_PROVIDER_SITE_OTHER): Payer: Medicare Other | Admitting: Family Medicine

## 2019-12-07 ENCOUNTER — Other Ambulatory Visit: Payer: Self-pay

## 2019-12-07 ENCOUNTER — Encounter: Payer: Self-pay | Admitting: Family Medicine

## 2019-12-07 VITALS — BP 163/92 | HR 84 | Temp 97.6°F | Resp 17 | Ht 63.5 in | Wt 178.2 lb

## 2019-12-07 DIAGNOSIS — I10 Essential (primary) hypertension: Secondary | ICD-10-CM | POA: Diagnosis not present

## 2019-12-07 DIAGNOSIS — C3491 Malignant neoplasm of unspecified part of right bronchus or lung: Secondary | ICD-10-CM | POA: Diagnosis not present

## 2019-12-07 DIAGNOSIS — Z23 Encounter for immunization: Secondary | ICD-10-CM | POA: Diagnosis not present

## 2019-12-07 DIAGNOSIS — F5102 Adjustment insomnia: Secondary | ICD-10-CM | POA: Diagnosis not present

## 2019-12-07 MED ORDER — LOSARTAN POTASSIUM 50 MG PO TABS: 50.0000 mg | ORAL_TABLET | Freq: Every day | ORAL | 1 refills | Status: DC

## 2019-12-07 MED ORDER — CLONAZEPAM 0.5 MG PO TABS: 0.2500 mg | ORAL_TABLET | Freq: Every day | ORAL | 0 refills | Status: DC | PRN

## 2019-12-07 NOTE — Patient Instructions (Signed)
° ° ° °  If you have lab work done today you will be contacted with your lab results within the next 2 weeks.  If you have not heard from us then please contact us. The fastest way to get your results is to register for My Chart. ° ° °IF you received an x-ray today, you will receive an invoice from [facility]. Please contact [facility] at [phone] with questions or concerns regarding your invoice.  ° °IF you received labwork today, you will receive an invoice from [facility]. Please contact [facility] at [phone] with questions or concerns regarding your invoice.  ° °Our billing staff will not be able to assist you with questions regarding bills from these companies. ° °You will be contacted with the lab results as soon as they are available. The fastest way to get your results is to activate your My Chart account. Instructions are located on the last page of this paperwork. If you have not heard from us regarding the results in 2 weeks, please contact this office. °  ° ° ° °

## 2020-01-13 ENCOUNTER — Telehealth: Payer: Medicare Other | Admitting: Family

## 2020-01-13 DIAGNOSIS — C3491 Malignant neoplasm of unspecified part of right bronchus or lung: Secondary | ICD-10-CM

## 2020-01-13 DIAGNOSIS — J449 Chronic obstructive pulmonary disease, unspecified: Secondary | ICD-10-CM

## 2020-01-13 MED ORDER — BENZONATATE 100 MG PO CAPS: 100.0000 mg | ORAL_CAPSULE | Freq: Three times a day (TID) | ORAL | 0 refills | Status: DC | PRN

## 2020-01-13 MED ORDER — PREDNISONE 10 MG (21) PO TBPK: ORAL_TABLET | ORAL | 0 refills | Status: DC

## 2020-01-13 MED ORDER — AZITHROMYCIN 250 MG PO TABS: ORAL_TABLET | ORAL | 0 refills | Status: DC

## 2020-01-13 NOTE — Progress Notes (Signed)
We are sorry that you are not feeling well.  Here is how we plan to help!  Based on your presentation I believe you most likely have A cough due to bacteria.  When patients have a fever and a productive cough with a change in color or increased sputum production, we are concerned about bacterial bronchitis.  If left untreated it can progress to pneumonia.  If your symptoms do not improve with your treatment plan it is important that you contact your provider.   I have prescribed Azithromyin 250 mg: two tablets now and then one tablet daily for 4 additonal days    In addition you may use A non-prescription cough medication called Mucinex DM: take 2 tablets every 12 hours. and A prescription cough medication called Tessalon Perles 100mg . You may take 1-2 capsules every 8 hours as needed for your cough.  Prednisone 10 mg daily for 6 days (see taper instructions below)  Directions for 6 day taper: Day 1: 2 tablets before breakfast, 1 after both lunch & dinner and 2 at bedtime Day 2: 1 tab before breakfast, 1 after both lunch & dinner and 2 at bedtime Day 3: 1 tab at each meal & 1 at bedtime Day 4: 1 tab at breakfast, 1 at lunch, 1 at bedtime Day 5: 1 tab at breakfast & 1 tab at bedtime Day 6: 1 tab at breakfast  Given your history of COPD and lung cancer we will go ahead and treat you with antibiotics. If you worsen over the next 1-2 days you need to be seen face to face!! I would hold off on getting your vaccine until you do not have fever or symptoms for 24-48 hours.    From your responses in the eVisit questionnaire you describe inflammation in the upper respiratory tract which is causing a significant cough.  This is commonly called Bronchitis and has four common causes:    Allergies  Viral Infections  Acid Reflux  Bacterial Infection Allergies, viruses and acid reflux are treated by controlling symptoms or eliminating the cause. An example might be a cough caused by taking certain blood  pressure medications. You stop the cough by changing the medication. Another example might be a cough caused by acid reflux. Controlling the reflux helps control the cough.  USE OF BRONCHODILATOR ("RESCUE") INHALERS: There is a risk from using your bronchodilator too frequently.  The risk is that over-reliance on a medication which only relaxes the muscles surrounding the breathing tubes can reduce the effectiveness of medications prescribed to reduce swelling and congestion of the tubes themselves.  Although you feel brief relief from the bronchodilator inhaler, your asthma may actually be worsening with the tubes becoming more swollen and filled with mucus.  This can delay other crucial treatments, such as oral steroid medications. If you need to use a bronchodilator inhaler daily, several times per day, you should discuss this with your provider.  There are probably better treatments that could be used to keep your asthma under control.     HOME CARE . Only take medications as instructed by your medical team. . Complete the entire course of an antibiotic. . Drink plenty of fluids and get plenty of rest. . Avoid close contacts especially the very young and the elderly . Cover your mouth if you cough or cough into your sleeve. . Always remember to wash your hands . A steam or ultrasonic humidifier can help congestion.   GET HELP RIGHT AWAY IF: . You develop  worsening fever. . You become short of breath . You cough up blood. . Your symptoms persist after you have completed your treatment plan MAKE SURE YOU   Understand these instructions.  Will watch your condition.  Will get help right away if you are not doing well or get worse.  Your e-visit answers were reviewed by a board certified advanced clinical practitioner to complete your personal care plan.  Depending on the condition, your plan could have included both over the counter or prescription medications. If there is a problem  please reply  once you have received a response from your provider. Your safety is important to [place].  If you have drug allergies check your prescription carefully.    You can use MyChart to ask questions about today's visit, request a non-urgent call back, or ask for a work or school excuse for 24 hours related to this e-Visit. If it has been greater than 24 hours you will need to follow up with your provider, or enter a new e-Visit to address those concerns. You will get an e-mail in the next two days asking about your experience.  I hope that your e-visit has been valuable and will speed your recovery. Thank you for using e-visits.  Approximately 5 minutes was spent documenting and reviewing patient's chart.

## 2020-01-18 ENCOUNTER — Other Ambulatory Visit: Payer: Self-pay | Admitting: Family Medicine

## 2020-01-18 NOTE — Telephone Encounter (Signed)
Requested Prescriptions  Pending Prescriptions Disp Refills  . atorvastatin (LIPITOR) 40 MG tablet [Pharmacy Med Name: ATORVASTATIN 40 MG TABLET] 90 tablet 1    Sig: TAKE 1 TABLET BY MOUTH EVERY DAY     Cardiovascular:  Antilipid - Statins Failed - 01/18/2020 12:50 AM      Failed - Total Cholesterol in normal range and within 360 days    Cholesterol, Total  Date Value Ref Range Status  11/21/2018 163 100 - 199 mg/dL Final         Failed - LDL in normal range and within 360 days    LDL Calculated  Date Value Ref Range Status  11/21/2018 61 0 - 99 mg/dL Final         Failed - HDL in normal range and within 360 days    HDL  Date Value Ref Range Status  11/21/2018 87 >39 mg/dL Final         Failed - Triglycerides in normal range and within 360 days    Triglycerides  Date Value Ref Range Status  11/21/2018 73 0 - 149 mg/dL Final         Passed - Patient is not pregnant      Passed - Valid encounter within last 12 months    Recent Outpatient Visits          1 month ago Uncontrolled hypertension   [facility] Care at [facility], Arlie Solomons, MD   4 months ago Essential hypertension   [facility] at [facility], [place], NP   7 months ago Acute left-sided low back pain with left-sided sciatica   Primary Care at [facility], Arlie Solomons, MD   11 months ago Essential hypertension   Primary Care at [facility], Arlie Solomons, MD   1 year ago Acute nonintractable headache, unspecified headache type   Primary Care at [facility] Arlie Solomons, MD      Future Appointments            In 1 month Forrest Moron, MD Primary Care at [place], [facility]

## 2020-02-29 ENCOUNTER — Other Ambulatory Visit: Payer: Self-pay | Admitting: Primary Care

## 2020-03-07 ENCOUNTER — Telehealth (INDEPENDENT_AMBULATORY_CARE_PROVIDER_SITE_OTHER): Payer: Medicare Other | Admitting: Family Medicine

## 2020-03-07 ENCOUNTER — Encounter: Payer: Self-pay | Admitting: Family Medicine

## 2020-03-07 ENCOUNTER — Other Ambulatory Visit: Payer: Self-pay | Admitting: Internal Medicine

## 2020-03-07 VITALS — BP 126/79

## 2020-03-07 DIAGNOSIS — E782 Mixed hyperlipidemia: Secondary | ICD-10-CM

## 2020-03-07 DIAGNOSIS — I10 Essential (primary) hypertension: Secondary | ICD-10-CM

## 2020-03-07 MED ORDER — LOSARTAN POTASSIUM 50 MG PO TABS: 50.0000 mg | ORAL_TABLET | Freq: Every day | ORAL | 1 refills | Status: DC

## 2020-03-07 NOTE — Patient Instructions (Signed)
° ° ° °  If you have lab work done today you will be contacted with your lab results within the next 2 weeks.  If you have not heard from us then please contact us. The fastest way to get your results is to register for My Chart. ° ° °IF you received an x-ray today, you will receive an invoice from [facility]. Please contact [facility] at [phone] with questions or concerns regarding your invoice.  ° °IF you received labwork today, you will receive an invoice from [facility]. Please contact [facility] at [phone] with questions or concerns regarding your invoice.  ° °Our billing staff will not be able to assist you with questions regarding bills from these companies. ° °You will be contacted with the lab results as soon as they are available. The fastest way to get your results is to activate your My Chart account. Instructions are located on the last page of this paperwork. If you have not heard from us regarding the results in 2 weeks, please contact this office. °  ° ° ° °

## 2020-03-07 NOTE — Progress Notes (Signed)
[place] VIDEO Encounter- SOAP NOTE Established Patient  Virtual Visit via Video Note  I connected with Tammy Ball on 03/07/20 at  4:40 PM EDT by a video enabled telemedicine application and verified that I am speaking with the correct person using two identifiers.  Location: Patient: HOME Provider: [facility]   I discussed the limitations of evaluation and management by telemedicine and the availability of in person appointments. The patient expressed understanding and agreed to proceed.      Chief Complaint  Patient presents with  . Hypertension    3 m f/u     Subjective   Tammy Ball is a 79 y.o. established patient. Telephone visit today for  HPI  Hypertension: Patient here for follow-up of elevated blood pressure. She is exercising and is adherent to low salt diet.  Blood pressure is well controlled at home. Cardiac symptoms none. Patient denies chest pain, claudication, fatigue and lower extremity edema.  Cardiovascular risk factors: advanced age (older than 62 for men, 34 for women) and hypertension. Use of agents associated with hypertension: none. History of target organ damage: none.     Patient Active Problem List   Diagnosis Date Noted  . HCAP (healthcare-associated pneumonia) 01/07/2019  . Allergic rhinitis 01/06/2019  . Chronic respiratory failure with hypoxia ([place]) 12/26/2018  . Age-related osteoporosis with current pathological fracture 11/21/2018  . Renal insufficiency 05/20/2018  . History of compression fracture of spine 12/10/2016  . Hypercalcemia 06/25/2016  . Insomnia 05/16/2016  . Pneumonitis, radiation ([place]) 04/11/2016  . S/P lobectomy of lung 01/18/2016  . Chemotherapy-induced neuropathy ([place]) 11/01/2015  . Non-small cell carcinoma of right lung, stage 2 ([place]) 10/03/2015  . Hyperlipidemia 08/14/2013  . Spondylolisthesis of lumbar region 07/03/2013  . Essential hypertension 12/17/2011  . COPD GOLD II  12/17/2011  . Renal cell cancer  ([place]) 12/17/2011  . Sciatica of left side 12/17/2011  . Depression 12/17/2011    Past Medical History:  Diagnosis Date  . Anemia    was while doing chemo  . Anxiety   . Asthma   . Chemotherapy-induced neuropathy ([place]) 11/01/2015  . Claustrophobia   . COPD (chronic obstructive pulmonary disease) ([place])   . Depression   . Encounter for antineoplastic chemotherapy 02/09/2016  . Glaucoma   . Headache    prior to menopause  . Heart murmur   . History of echocardiogram    Echo 2/17: EF 56-38%, grade 1 diastolic dysfunction, mild MR, trivial pericardial effusion  . History of nuclear stress test    Myoview 2/17: no ischemia or scar, EF 79%; low risk  . History of radiation therapy 10/30/16-11/06/16  . Hyperlipidemia   . Hypertension   . Insomnia 05/16/2016  . Non-small cell carcinoma of right lung, stage 2 ([place]) 10/03/2015  . Radiation 02/29/16-04/10/16   50.4 Gy to right central chest  . Renal cell carcinoma ([place])    L nephrectomy  in 2012    Current Outpatient Medications  Medication Sig Dispense Refill  . acetaminophen (TYLENOL) 500 MG tablet Take 500 mg by mouth every 6 (six) hours as needed for mild pain, moderate pain, fever or headache. Reported on 05/24/2016    . albuterol (PROVENTIL) (2.5 MG/3ML) 0.083% nebulizer solution Take 3 mLs (2.5 mg total) by nebulization every 6 (six) hours as needed for wheezing or shortness of breath. 75 mL 12  . albuterol (VENTOLIN HFA) 108 (90 Base) MCG/ACT inhaler Inhale 1-2 puffs into the lungs every 6 (six) hours as needed for  wheezing or shortness of breath. 1 Inhaler 6  . Ascorbic Acid (VITAMIN C) 1000 MG tablet Take 1,000 mg by mouth daily.    Marland Kitchen aspirin EC 81 MG tablet Take 81 mg by mouth daily. Reported on 04/11/2016    . atorvastatin (LIPITOR) 40 MG tablet TAKE 1 TABLET BY MOUTH EVERY DAY 90 tablet 1  . calcium carbonate (TUMS EX) 750 MG chewable tablet Chew 1 tablet by mouth as needed for heartburn.    . Carboxymethylcellulose Sodium  (ARTIFICIAL TEARS OP) Place 1 drop into both eyes daily as needed (dry eyes).    . Cholecalciferol (VITAMIN D3 PO) Take 1,200 mg by mouth daily.    . clonazePAM (KLONOPIN) 0.5 MG tablet Take 0.5 tablets (0.25 mg total) by mouth daily as needed for anxiety. 30 tablet 0  . fish oil-omega-3 fatty acids 1000 MG capsule Take 1 capsule by mouth daily.     Marland Kitchen losartan (COZAAR) 50 MG tablet Take 1 tablet (50 mg total) by mouth daily. 90 tablet 1  . OXYGEN Inhale 2 L into the lungs continuous.    . sodium chloride (OCEAN) 0.65 % SOLN nasal spray Place 1 spray into both nostrils as needed for congestion.    Marland Kitchen tiZANidine (ZANAFLEX) 2 MG tablet TAKE 1 TABLET BY MOUTH 3 TIMES A DAY AS NEEDED FOR SPASMS 90 tablet 1  . traMADol (ULTRAM) 50 MG tablet Take 1 tablet (50 mg total) by mouth every 6 (six) hours as needed (BACK PAIN). 90 tablet 0  . traZODone (DESYREL) 50 MG tablet TAKE 1 TO 2 TABLETS BY MOUTH AT BEDTIME AS NEEDED FOR SLEEP 90 tablet 0  . TRELEGY ELLIPTA 100-62.5-25 MCG/INH AEPB INHALE 1 PUFF BY MOUTH EVERY DAY 60 each 0  . zinc gluconate 50 MG tablet Take 50 mg by mouth daily.    . fluticasone (FLONASE) 50 MCG/ACT nasal spray Place 2 sprays into both nostrils daily. (Patient not taking: Reported on 03/07/2020) 16 g 2   Current Facility-Administered Medications  Medication Dose Route Frequency Provider Last Rate Last Admin  . acetaminophen (TYLENOL) tablet 500 mg  500 mg Oral Once Forrest Moron, MD        Allergies  Allergen Reactions  . Bee Venom Anaphylaxis, Shortness Of Breath, Swelling and Other (See Comments)    Swelling at site   . Amlodipine Swelling and Other (See Comments)    Swelling of the ankles and hands   . Levofloxacin Other (See Comments)    Joint pain   . Hctz [Hydrochlorothiazide] Palpitations and Other (See Comments)    Sweating     Social History   Socioeconomic History  . Marital status: Widowed    Spouse name: Not on file  . Number of children: 4  . Years of  education: Not on file  . Highest education level: High school graduate  Occupational History  . Not on file  Tobacco Use  . Smoking status: Former Smoker    Packs/day: 1.00    Years: 50.00    Pack years: 50.00    Types: Cigarettes    Quit date: 03/16/2011    Years since quitting: 8.9  . Smokeless tobacco: Never Used  . Tobacco comment: last 4-5 years of smoking, smoked 0.5 pack/day   Substance and Sexual Activity  . Alcohol use: No    Alcohol/week: 0.0 standard drinks  . Drug use: No  . Sexual activity: Not Currently  Other Topics Concern  . Not on file  Social History Narrative  Marital status: divorced; not dating in 2019.      Children: 4 children; 3 grandchildren adult; 4 gg.      Lives: alone in house      Employment: part-time work; substance abuse counselor; [employer].      Tobacco: quit smoking 2012; smoked 45 years      Alcohol: none      Drugs: none      Exercise: none in 2019; due to LEFT sciatica.      ADLs: independent with ADLs; drives.       Advanced Directives: YES: HCPOA: Nicholas Martinez/son.  FULL CODE but no prolonged measures.      Occupation: Substance Abuse Estate agent   No exercise** Merged History Encounter **       ** Data from: 12/14/11 Enc Dept: [facility]       ** Data from: 12/17/11 Enc Dept: [facility]   Substance abuse counselor   Husband deceased   4 great grandchildren   Son works in same substance abuse counseling center as patient   Social Determinants of Radio broadcast assistant Strain:   . Difficulty of Paying Living Expenses:   Food Insecurity:   . Worried About Charity fundraiser in the Last Year:   . Arboriculturist in the Last Year:   Transportation Needs:   . Film/video editor (Medical):   Marland Kitchen Lack of Transportation (Non-Medical):   Physical Activity:   . Days of Exercise per Week:   . Minutes of Exercise per Session:   Stress:   . Feeling of Stress :     Social Connections:   . Frequency of Communication with Friends and Family:   . Frequency of Social Gatherings with Friends and Family:   . Attends Religious Services:   . Active Member of Clubs or Organizations:   . Attends Archivist Meetings:   Marland Kitchen Marital Status:   Intimate Partner Violence:   . Fear of Current or Ex-Partner:   . Emotionally Abused:   Marland Kitchen Physically Abused:   . Sexually Abused:     ROS  Objective   Vitals as reported by the patient: Today's Vitals   03/07/20 1502  BP: 126/79   Physical Exam  Constitutional: Oriented to person, place, and time. Appears well-developed and well-nourished.  HENT:  Head: Normocephalic and atraumatic.  Eyes: Conjunctivae and EOM are normal.  Pulmonary/Chest: Effort normal  Neurological: Is alert and oriented to person, place, and time.  Psychiatric: Has a normal mood and affect. Behavior is normal. Judgment and thought content normal.   Jannatul was seen today for hypertension.  Diagnoses and all orders for this visit:  Essential hypertension  Other orders -     losartan (COZAAR) 50 MG tablet; Take 1 tablet (50 mg total) by mouth daily.   Patient's blood pressure is at goal of 139/89 or less. Condition is stable. Continue current medications and treatment plan. I recommend that you exercise for 30-45 minutes 5 days a week. I also recommend a balanced diet with fruits and vegetables every day, lean meats, and little fried foods. The DASH diet (you can find this online) is a good example of this.     I discussed the assessment and treatment plan with the patient. The patient was provided an opportunity to ask questions and all were answered. The patient agreed with the plan and demonstrated an understanding of the  instructions.   The patient was advised to call back or seek an in-person evaluation if the symptoms worsen or if the condition fails to improve as anticipated.  I provided 15 minutes of  non-face-to-face time during this encounter.  Forrest Moron, MD  Primary Care at [facility]

## 2020-03-29 ENCOUNTER — Encounter: Payer: Self-pay | Admitting: Family Medicine

## 2020-03-29 ENCOUNTER — Ambulatory Visit: Payer: Medicare Other | Admitting: Registered Nurse

## 2020-03-29 VITALS — BP 126/79 | Ht 63.0 in | Wt 178.0 lb

## 2020-03-29 DIAGNOSIS — Z0000 Encounter for general adult medical examination without abnormal findings: Secondary | ICD-10-CM

## 2020-03-29 NOTE — Progress Notes (Signed)
Presents today for [employer] Visit   Date of last exam: 03/07/2020  Interpreter used for this visit? No  I connected with  Tammy Ball on 03/29/20 by a telephone  and verified that I am speaking with the correct person using two identifiers.   I discussed the limitations of evaluation and management by telemedicine. The patient expressed understanding and agreed to proceed.    Patient Care Team: Forrest Moron, MD as PCP - General (Internal Medicine) Curt Bears, MD as Consulting Physician (Oncology) [name], MD as Consulting Physician (Cardiothoracic Surgery)   Other items to address today:   Discussed Eye/Dental Discussed Immunizations    ADVANCE DIRECTIVES: Discussed: no On File yes Materials Provided: no  Immunization status:  Immunization History  Administered Date(s) Administered  . Fluad Quad(high Dose 65+) 07/18/2019  . Hepatitis B 11/12/1998  . Influenza Split 09/12/2012  . Influenza, High Dose Seasonal PF 08/23/2017, 08/23/2018  . Influenza,inj,Quad PF,6+ Mos 07/18/2013  . Influenza-Unspecified 07/29/2014, 07/23/2015, 08/25/2016  . PFIZER SARS-COV-2 Vaccination 12/24/2019, 01/18/2020  . Pneumococcal Conjugate-13 07/29/2015  . Pneumococcal Polysaccharide-23 11/12/2000, 10/23/2012, 08/23/2017  . Pneumococcal-Unspecified 10/23/2012, 08/23/2017  . Tdap 12/02/2013  . Zoster 12/13/2010     There are no preventive care reminders to display for this patient.   Functional Status Survey: Is the patient deaf or have difficulty hearing?: No Does the patient have difficulty seeing, even when wearing glasses/contacts?: No Does the patient have difficulty concentrating, remembering, or making decisions?: No Does the patient have difficulty walking or climbing stairs?: Yes(neurophathy in both feet chemo/ sob) Does the patient have difficulty dressing or bathing?: No Does the patient have difficulty doing errands alone such  as visiting a doctor's office or shopping?: No   6CIT Screen 03/29/2020 11/26/2018 11/13/2017  What Year? 0 points 0 points 0 points  What month? 0 points 0 points 0 points  What time? 0 points 0 points 0 points  Count back from 20 0 points 0 points 0 points  Months in reverse 0 points 0 points 0 points  Repeat phrase 0 points 0 points 0 points  Total Score 0 0 0        Clinical Support from 03/29/2020 in Primary Care at [facility]  AUDIT-C Score  0       Home Environment:   Works Administrator, arts work Lives in [employer] story home Yes trouble climbing / back problems/ neuropathy in feet from chemo No scattered rugs Yes grab bars Adequate lighting/no clutter   Patient Active Problem List   Diagnosis Date Noted  . HCAP (healthcare-associated pneumonia) 01/07/2019  . Allergic rhinitis 01/06/2019  . Chronic respiratory failure with hypoxia ([place]) 12/26/2018  . Age-related osteoporosis with current pathological fracture 11/21/2018  . Renal insufficiency 05/20/2018  . History of compression fracture of spine 12/10/2016  . Hypercalcemia 06/25/2016  . Insomnia 05/16/2016  . Pneumonitis, radiation ([place]) 04/11/2016  . S/P lobectomy of lung 01/18/2016  . Chemotherapy-induced neuropathy ([place]) 11/01/2015  . Non-small cell carcinoma of right lung, stage 2 ([place]) 10/03/2015  . Hyperlipidemia 08/14/2013  . Spondylolisthesis of lumbar region 07/03/2013  . Essential hypertension 12/17/2011  . COPD GOLD II  12/17/2011  . Renal cell cancer ([place]) 12/17/2011  . Sciatica of left side 12/17/2011  . Depression 12/17/2011     Past Medical History:  Diagnosis Date  . Anemia    was while doing chemo  . Anxiety   . Asthma   . Chemotherapy-induced neuropathy ([place]) 11/01/2015  .  Claustrophobia   . COPD (chronic obstructive pulmonary disease) ([place])   . Depression   . Encounter for antineoplastic chemotherapy 02/09/2016  . Glaucoma   . Headache    prior to menopause  . Heart murmur   .  History of echocardiogram    Echo 2/17: EF 71-06%, grade 1 diastolic dysfunction, mild MR, trivial pericardial effusion  . History of nuclear stress test    Myoview 2/17: no ischemia or scar, EF 79%; low risk  . History of radiation therapy 10/30/16-11/06/16  . Hyperlipidemia   . Hypertension   . Insomnia 05/16/2016  . Non-small cell carcinoma of right lung, stage 2 ([place]) 10/03/2015  . Radiation 02/29/16-04/10/16   50.4 Gy to right central chest  . Renal cell carcinoma ([place])    L nephrectomy  in 2012     Past Surgical History:  Procedure Laterality Date  . BACK SURGERY     cervical 1991  . EYE SURGERY    . IR GENERIC HISTORICAL  01/16/2017   IR RADIOLOGIST EVAL & MGMT 01/16/2017 [facility]  . IR GENERIC HISTORICAL  01/31/2017   IR FLUORO GUIDED NEEDLE PLC ASPIRATION/INJECTION LOC 01/31/2017 Luanne Bras, MD [facility]  . IR GENERIC HISTORICAL  01/31/2017   IR VERTEBROPLASTY CERV/THOR BX INC UNI/BIL INC/INJECT/IMAGING 01/31/2017 Luanne Bras, MD [facility]  . IR GENERIC HISTORICAL  01/31/2017   IR VERTEBROPLASTY EA ADDL (T&LS) BX INC UNI/BIL INC INJECT/IMAGING 01/31/2017 Luanne Bras, MD [facility]  . kidney cancer    . NEPHRECTOMY    . SPINE SURGERY    . TUBAL LIGATION    . VIDEO ASSISTED THORACOSCOPY (VATS)/WEDGE RESECTION Right 01/18/2016   Procedure: VIDEO ASSISTED THORACOSCOPY (VATS)/LUNG RESECTION, THOROCOTOMY, RIGHT UPPER LOBECTOMY, LYMPH NODE DISSECTION, PLACEMENT OF ON Q;  Surgeon: [name], MD;  Location: [facility];  Service: Thoracic;  Laterality: Right;  Marland Kitchen VIDEO BRONCHOSCOPY Bilateral 09/20/2015   Procedure: VIDEO BRONCHOSCOPY WITHOUT FLUORO;  Surgeon: Rigoberto Noel, MD;  Location: [facility];  Service: Cardiopulmonary;  Laterality: Bilateral;  . VIDEO BRONCHOSCOPY N/A 01/18/2016   Procedure: VIDEO BRONCHOSCOPY;  Surgeon: [name], MD;  Location[facility];  Service: Thoracic;  Laterality: N/A;     Family History  Problem Relation Age of Onset   . Heart disease Sister   . Obesity Brother   . Heart attack Daughter 55       s/p CABG  . Glaucoma Daughter   . Breast cancer Sister   . Birth defects Sister   . Cancer Mother        Bladder Cancer  . Hypertension Mother   . CAD Mother 35  . Cancer Maternal Grandmother      Social History   Socioeconomic History  . Marital status: Widowed    Spouse name: Not on file  . Number of children: 4  . Years of education: Not on file  . Highest education level: High school graduate  Occupational History  . Not on file  Tobacco Use  . Smoking status: Former Smoker    Packs/day: 1.00    Years: 50.00    Pack years: 50.00    Types: Cigarettes    Quit date: 03/16/2011    Years since quitting: 9.0  . Smokeless tobacco: Never Used  . Tobacco comment: last 4-5 years of smoking, smoked 0.5 pack/day   Substance and Sexual Activity  . Alcohol use: No    Alcohol/week: 0.0 standard drinks  . Drug use: No  . Sexual activity:  Not Currently  Other Topics Concern  . Not on file  Social History Narrative   Marital status: divorced; not dating in 2019.      Children: 4 children; 3 grandchildren adult; 4 gg.      Lives: alone in house      Employment: part-time work; substance abuse counselor; [employer].      Tobacco: quit smoking 2012; smoked 45 years      Alcohol: none      Drugs: none      Exercise: none in 2019; due to LEFT sciatica.      ADLs: independent with ADLs; drives.       Advanced Directives: YES: HCPOA: Nicholas Martinez/son.  FULL CODE but no prolonged measures.      Occupation: Substance Abuse Estate agent   No exercise** Merged History Encounter **       ** Data from: 12/14/11 Enc Dept: [facility]       ** Data from: 12/17/11 Enc Dept: [facility]   Substance abuse counselor   Husband deceased   4 great grandchildren   Son works in same substance abuse counseling center as patient   Social Determinants of Company secretary Strain:   . Difficulty of Paying Living Expenses:   Food Insecurity:   . Worried About Charity fundraiser in the Last Year:   . Arboriculturist in the Last Year:   Transportation Needs:   . Film/video editor (Medical):   Marland Kitchen Lack of Transportation (Non-Medical):   Physical Activity:   . Days of Exercise per Week:   . Minutes of Exercise per Session:   Stress:   . Feeling of Stress :   Social Connections:   . Frequency of Communication with Friends and Family:   . Frequency of Social Gatherings with Friends and Family:   . Attends Religious Services:   . Active Member of Clubs or Organizations:   . Attends Archivist Meetings:   Marland Kitchen Marital Status:   Intimate Partner Violence:   . Fear of Current or Ex-Partner:   . Emotionally Abused:   Marland Kitchen Physically Abused:   . Sexually Abused:      Allergies  Allergen Reactions  . Bee Venom Anaphylaxis, Shortness Of Breath, Swelling and Other (See Comments)    Swelling at site   . Amlodipine Swelling and Other (See Comments)    Swelling of the ankles and hands   . Levofloxacin Other (See Comments)    Joint pain   . Hctz [Hydrochlorothiazide] Palpitations and Other (See Comments)    Sweating      Prior to Admission medications   Medication Sig Start Date End Date Taking? Authorizing Provider  acetaminophen (TYLENOL) 500 MG tablet Take 500 mg by mouth every 6 (six) hours as needed for mild pain, moderate pain, fever or headache. Reported on 05/24/2016   Yes [provider]  albuterol (PROVENTIL) (2.5 MG/3ML) 0.083% nebulizer solution Take 3 mLs (2.5 mg total) by nebulization every 6 (six) hours as needed for wheezing or shortness of breath. 12/30/18  Yes Rai, Ripudeep K, MD  albuterol (VENTOLIN HFA) 108 (90 Base) MCG/ACT inhaler Inhale 1-2 puffs into the lungs every 6 (six) hours as needed for wheezing or shortness of breath. 02/13/19  Yes Forrest Moron, MD  Ascorbic Acid (VITAMIN C) 1000 MG  tablet Take 1,000 mg by mouth daily.   Yes  [provider]  aspirin EC 81 MG tablet Take 81 mg by mouth daily. Reported on 04/11/2016   Yes [provider]  atorvastatin (LIPITOR) 40 MG tablet TAKE 1 TABLET BY MOUTH EVERY DAY 01/18/20  Yes Stallings, Zoe A, MD  calcium carbonate (TUMS EX) 750 MG chewable tablet Chew 1 tablet by mouth as needed for heartburn.   Yes [provider]  Carboxymethylcellulose Sodium (ARTIFICIAL TEARS OP) Place 1 drop into both eyes daily as needed (dry eyes).   Yes [provider]  Cholecalciferol (VITAMIN D3 PO) Take 1,200 mg by mouth daily.   Yes [provider]  clonazePAM (KLONOPIN) 0.5 MG tablet Take 0.5 tablets (0.25 mg total) by mouth daily as needed for anxiety. 12/07/19  Yes Stallings, Zoe A, MD  fish oil-omega-3 fatty acids 1000 MG capsule Take 1 capsule by mouth daily.    Yes [provider]  losartan (COZAAR) 50 MG tablet Take 1 tablet (50 mg total) by mouth daily. 03/07/20  Yes Delia Chimes A, MD  OXYGEN Inhale 2 L into the lungs continuous.   Yes [provider]  sodium chloride (OCEAN) 0.65 % SOLN nasal spray Place 1 spray into both nostrils as needed for congestion.   Yes [provider]  tiZANidine (ZANAFLEX) 2 MG tablet TAKE 1 TABLET BY MOUTH 3 TIMES A DAY AS NEEDED FOR SPASMS 08/12/19  Yes Stallings, Zoe A, MD  traMADol (ULTRAM) 50 MG tablet Take 1 tablet (50 mg total) by mouth every 6 (six) hours as needed (BACK PAIN). 06/01/19  Yes Delia Chimes A, MD  traZODone (DESYREL) 50 MG tablet TAKE 1 TO 2 TABLETS BY MOUTH AT BEDTIME AS NEEDED FOR SLEEP 08/26/19  Yes Delia Chimes A, MD  TRELEGY ELLIPTA 100-62.5-25 MCG/INH AEPB INHALE 1 PUFF BY MOUTH EVERY DAY 02/29/20  Yes Byrum, Rose Fillers, MD  zinc gluconate 50 MG tablet Take 50 mg by mouth daily.   Yes [provider]  fluticasone (FLONASE) 50 MCG/ACT nasal spray Place 2 sprays into both nostrils daily. Patient not taking: Reported  on 03/07/2020 01/11/19   Mendel Corning, MD     Depression screen [facility] 2/9 03/29/2020 03/07/2020 12/07/2019 09/01/2019 06/01/2019  Decreased Interest 0 0 0 0 1  Down, Depressed, Hopeless 0 0 0 0 1  PHQ - 2 Score 0 0 0 0 2  Altered sleeping - - - - 2  Tired, decreased energy - - - - 3  Change in appetite - - - - 1  Feeling bad or failure about yourself  - - - - 0  Trouble concentrating - - - - 0  Moving slowly or fidgety/restless - - - - 0  Suicidal thoughts - - - - 0  PHQ-9 Score - - - - 8  Difficult doing work/chores - - - - Somewhat difficult  Some recent data might be hidden     Fall Risk  03/29/2020 03/07/2020 12/07/2019 09/01/2019 06/01/2019  Falls in the past year? 1 0 0 0 0  Number falls in past yr: 0 0 0 0 0  Comment fell out of chair no injury - - - -  Injury with Fall? 0 0 0 0 0  Follow up Falls evaluation completed;Education provided Falls evaluation completed Falls evaluation completed - Falls evaluation completed      PHYSICAL EXAM: BP 126/79 Comment: taken from previous visit  Ht 5\' 3"  (1.6 m)   Wt 178 lb (80.7 kg)   BMI 31.53 kg/m  Wt Readings from Last 3 Encounters:  03/29/20 178 lb (80.7 kg)  12/07/19 178 lb 3.2 oz (80.8 kg)  12/03/19 178 lb 6.4 oz (80.9 kg)      Education/Counseling provided regarding diet and exercise, prevention of chronic diseases, smoking/tobacco cessation, if applicable, and reviewed "Covered Medicare Preventive Services."

## 2020-03-29 NOTE — Patient Instructions (Addendum)
Thank you for taking time to come for your Medicare Wellness Visit. I appreciate your ongoing commitment to your health goals. Please review the following plan we discussed and let me know if I can assist you in the future.  Mauricio Dahlen LPN  Preventive Care 65 Years and Older, Female Preventive care refers to lifestyle choices and visits with your health care provider that can promote health and wellness. This includes:  A yearly physical exam. This is also called an annual well check.  Regular dental and eye exams.  Immunizations.  Screening for certain conditions.  Healthy lifestyle choices, such as diet and exercise. What can I expect for my preventive care visit? Physical exam Your health care provider will check:  Height and weight. These may be used to calculate body mass index (BMI), which is a measurement that tells if you are at a healthy weight.  Heart rate and blood pressure.  Your skin for abnormal spots. Counseling Your health care provider may ask you questions about:  Alcohol, tobacco, and drug use.  Emotional well-being.  Home and relationship well-being.  Sexual activity.  Eating habits.  History of falls.  Memory and ability to understand (cognition).  Work and work environment.  Pregnancy and menstrual history. What immunizations do I need?  Influenza (flu) vaccine  This is recommended every year. Tetanus, diphtheria, and pertussis (Tdap) vaccine  You may need a Td booster every 10 years. Varicella (chickenpox) vaccine  You may need this vaccine if you have not already been vaccinated. Zoster (shingles) vaccine  You may need this after age 60. Pneumococcal conjugate (PCV13) vaccine  One dose is recommended after age 65. Pneumococcal polysaccharide (PPSV23) vaccine  One dose is recommended after age 65. Measles, mumps, and rubella (MMR) vaccine  You may need at least one dose of MMR if you were born in 1957 or later. You may also  need a second dose. Meningococcal conjugate (MenACWY) vaccine  You may need this if you have certain conditions. Hepatitis A vaccine  You may need this if you have certain conditions or if you travel or work in places where you may be exposed to hepatitis A. Hepatitis B vaccine  You may need this if you have certain conditions or if you travel or work in places where you may be exposed to hepatitis B. Haemophilus influenzae type b (Hib) vaccine  You may need this if you have certain conditions. You may receive vaccines as individual doses or as more than one vaccine together in one shot (combination vaccines). Talk with your health care provider about the risks and benefits of combination vaccines. What tests do I need? Blood tests  Lipid and cholesterol levels. These may be checked every 5 years, or more frequently depending on your overall health.  Hepatitis C test.  Hepatitis B test. Screening  Lung cancer screening. You may have this screening every year starting at age 55 if you have a 30-pack-year history of smoking and currently smoke or have quit within the past 15 years.  Colorectal cancer screening. All adults should have this screening starting at age 50 and continuing until age 75. Your health care provider may recommend screening at age 45 if you are at increased risk. You will have tests every 1-10 years, depending on your results and the type of screening test.  Diabetes screening. This is done by checking your blood sugar (glucose) after you have not eaten for a while (fasting). You may have this done every 1-3   years.  Mammogram. This may be done every 1-2 years. Talk with your health care provider about how often you should have regular mammograms.  BRCA-related cancer screening. This may be done if you have a family history of breast, ovarian, tubal, or peritoneal cancers. Other tests  Sexually transmitted disease (STD) testing.  Bone density scan. This is done  to screen for osteoporosis. You may have this done starting at age 94. Follow these instructions at home: Eating and drinking  Eat a diet that includes fresh fruits and vegetables, whole grains, lean protein, and low-fat dairy products. Limit your intake of foods with high amounts of sugar, saturated fats, and salt.  Take vitamin and mineral supplements as recommended by your health care provider.  Do not drink alcohol if your health care provider tells you not to drink.  If you drink alcohol: ? Limit how much you have to 0-1 drink a day. ? Be aware of how much alcohol is in your drink. In the [place] one drink equals one 12 oz bottle of beer (355 mL), one 5 oz glass of wine (148 mL), or one 1 oz glass of hard liquor (44 mL). Lifestyle  Take daily care of your teeth and gums.  Stay active. Exercise for at least 30 minutes on 5 or more days each week.  Do not use any products that contain nicotine or tobacco, such as cigarettes, e-cigarettes, and chewing tobacco. If you need help quitting, ask your health care provider.  If you are sexually active, practice safe sex. Use a condom or other form of protection in order to prevent STIs (sexually transmitted infections).  Talk with your health care provider about taking a low-dose aspirin or statin. What's next?  Go to your health care provider once a year for a well check visit.  Ask your health care provider how often you should have your eyes and teeth checked.  Stay up to date on all vaccines. This information is not intended to replace advice given to you by your health care provider. Make sure you discuss any questions you have with your health care provider. Document Revised: 10/23/2018 Document Reviewed: 10/23/2018 Elsevier Patient Education  2020 [employer].

## 2020-04-01 ENCOUNTER — Other Ambulatory Visit: Payer: Self-pay

## 2020-04-01 ENCOUNTER — Encounter: Payer: Self-pay | Admitting: Emergency Medicine

## 2020-04-01 ENCOUNTER — Ambulatory Visit (INDEPENDENT_AMBULATORY_CARE_PROVIDER_SITE_OTHER): Payer: Medicare Other | Admitting: Emergency Medicine

## 2020-04-01 DIAGNOSIS — C3491 Malignant neoplasm of unspecified part of right bronchus or lung: Secondary | ICD-10-CM

## 2020-04-01 DIAGNOSIS — J449 Chronic obstructive pulmonary disease, unspecified: Secondary | ICD-10-CM

## 2020-04-01 MED ORDER — TRELEGY ELLIPTA 100-62.5-25 MCG/INH IN AEPB
1.0000 | INHALATION_SPRAY | Freq: Every day | RESPIRATORY_TRACT | 0 refills | Status: DC
Start: 2020-04-01 — End: 2020-06-01

## 2020-04-01 NOTE — Assessment & Plan Note (Signed)
.    Doing well on Trelegy.  Plan to continue.  Continue social distancing and mask wearing as appropriate.  COVID-19 vaccine is up-to-date.  No flares

## 2020-04-01 NOTE — Addendum Note (Signed)
Addended by: Gavin Potters R on: 04/01/2020 11:20 AM   Modules accepted: Orders

## 2020-04-01 NOTE — Patient Instructions (Signed)
Continue Trelegy 1 inhalation once daily as you have been taking it.  Rinse and gargle after using. Keep your albuterol available use 2 puffs if needed shortness of breath, chest tightness, wheezing. COVID-19 vaccine is up-to-date Get your repeat CT scan of the chest in July 2021 as planned by Dr. Julien Nordmann. Follow with Dr Lamonte Sakai in 6 months or sooner if you have any problems

## 2020-04-01 NOTE — Progress Notes (Signed)
Subjective:    Patient ID: Tammy Ball, female    DOB: [birth year], 79 y.o.   MRN: [id number]   HPI 79 year old woman with a history of tobacco use (pack years) renal cell carcinoma (2012), squamous cell lung cancer diagnosed by bronchoscopy November 2016 status post RU lobectomy and chemoradiation, hypertension with diastolic dysfunction, COPD with mild obstruction noted on PFT 12/27/2015 that I reviewed. She had normal volumes and a decreased DLCO. She has O2 at home, has had it for the last year.   Most recent CT chest 11/25/18, reviewed by me shows no evidence recurrence, chronic LLL scar, no adenopathy.   She was admitted with an AE-COPD +/- CAP in mid February 2020. Started as URI sx (known sick contact). She has 6 more days of steroids. She is having exertional SOB with chores. She has minimal wheeze, worst at night when she lays down. Her maintenance is Advair, uses albuterol typically only when flaring.   ROV 04/01/20 --follow-up visit 79 year old woman with a history of tobacco use and COPD, renal cell carcinoma, squamous cell lung cancer with a right upper lobe lobectomy November 2016 followed by chemoradiation.  Also with hypertension and diastolic dysfunction.  We have been managing her on Trelegy.  She reports that she is doing well on this - no side effects. Superior to Advair. No flares in over a year when she had a viral infxn (? COVID). She uses albuterol very rarely, once a month. Has used some flonase prn, zyrtec prn. She does [place] prn as well.  Following her Ct chest every 6 months. Last Ct was 12/01/19, reviewed by me, 3.3x2.3 LLL opacity. Next in 05/2020.    Review of Systems  Constitutional: Negative for fever and unexpected weight change.  HENT: Negative for congestion, dental problem, ear pain, nosebleeds, postnasal drip, rhinorrhea, sinus pressure, sneezing, sore throat and trouble swallowing.   Eyes: Negative for redness and itching.  Respiratory: Positive for shortness  of breath and wheezing. Negative for cough and chest tightness.   Cardiovascular: Negative for palpitations and leg swelling.  Gastrointestinal: Negative for nausea and vomiting.  Genitourinary: Negative for dysuria.  Musculoskeletal: Negative for joint swelling.  Skin: Negative for rash.  Allergic/Immunologic: Negative.  Negative for environmental allergies, food allergies and immunocompromised state.  Neurological: Negative for headaches.  Hematological: Does not bruise/bleed easily.  Psychiatric/Behavioral: Negative for dysphoric mood. The patient is nervous/anxious.      Objective:   Physical Exam Vitals:   04/01/20 0920  BP: 138/64  Pulse: (!) 117  Temp: (!) 96.7 F (35.9 C)  TempSrc: Temporal  SpO2: 95%  Weight: 186 lb (84.4 kg)  Height: 5' 3.5" (1.613 m)   Gen: Pleasant, elderly woman, in no distress,  normal affect  ENT: No lesions,  mouth clear,  oropharynx clear, no postnasal drip  Neck: No JVD, harsh UA noise  Lungs: No use of accessory muscles, no crackles or wheezing on normal respiration, good air movement on forced exp as well  Cardiovascular: RRR, heart sounds normal, no murmur or gallops, no peripheral edema  Musculoskeletal: No deformities, no cyanosis or clubbing  Neuro: alert, awake, non focal  Skin: Warm, no lesions or rash      Assessment & Plan:  Non-small cell carcinoma of right lung, stage 2 (HCC) Following with Dr. Earlie Server.  Next CT scan planned for 05/2020 to follow 3.3 x 2.2 cm left lower lobe opacity.  If there is interval change in she needs tissue diagnosis then I will  help arrange biopsy  COPD GOLD II  .  Doing well on Trelegy.  Plan to continue.  Continue social distancing and mask wearing as appropriate.  COVID-19 vaccine is up-to-date.  No flares   Baltazar Apo, MD, PhD 04/01/2020, 9:50 AM [facility] [phone] or if no answer [phone]

## 2020-04-01 NOTE — Assessment & Plan Note (Signed)
Following with Dr. Earlie Server.  Next CT scan planned for 05/2020 to follow 3.3 x 2.2 cm left lower lobe opacity.  If there is interval change in she needs tissue diagnosis then I will help arrange biopsy

## 2020-04-20 ENCOUNTER — Other Ambulatory Visit: Payer: Self-pay | Admitting: Emergency Medicine

## 2020-05-30 ENCOUNTER — Inpatient Hospital Stay: Payer: Medicare Other | Attending: Internal Medicine

## 2020-05-30 ENCOUNTER — Ambulatory Visit (HOSPITAL_COMMUNITY)
Admission: RE | Admit: 2020-05-30 | Discharge: 2020-05-30 | Disposition: A | Discharge status: Home / Self Care | Payer: Medicare Other | Source: Ambulatory Visit | Attending: Internal Medicine | Admitting: Internal Medicine

## 2020-05-30 ENCOUNTER — Other Ambulatory Visit: Payer: Self-pay

## 2020-05-30 DIAGNOSIS — Z9221 Personal history of antineoplastic chemotherapy: Secondary | ICD-10-CM | POA: Diagnosis not present

## 2020-05-30 DIAGNOSIS — R011 Cardiac murmur, unspecified: Secondary | ICD-10-CM | POA: Insufficient documentation

## 2020-05-30 DIAGNOSIS — C3490 Malignant neoplasm of unspecified part of unspecified bronchus or lung: Secondary | ICD-10-CM

## 2020-05-30 DIAGNOSIS — Z85528 Personal history of other malignant neoplasm of kidney: Secondary | ICD-10-CM | POA: Diagnosis not present

## 2020-05-30 DIAGNOSIS — E782 Mixed hyperlipidemia: Secondary | ICD-10-CM

## 2020-05-30 DIAGNOSIS — C3411 Malignant neoplasm of upper lobe, right bronchus or lung: Secondary | ICD-10-CM | POA: Insufficient documentation

## 2020-05-30 DIAGNOSIS — J984 Other disorders of lung: Secondary | ICD-10-CM | POA: Diagnosis not present

## 2020-05-30 DIAGNOSIS — Z79899 Other long term (current) drug therapy: Secondary | ICD-10-CM | POA: Diagnosis not present

## 2020-05-30 DIAGNOSIS — J449 Chronic obstructive pulmonary disease, unspecified: Secondary | ICD-10-CM | POA: Insufficient documentation

## 2020-05-30 DIAGNOSIS — Z923 Personal history of irradiation: Secondary | ICD-10-CM | POA: Insufficient documentation

## 2020-05-30 DIAGNOSIS — G4700 Insomnia, unspecified: Secondary | ICD-10-CM | POA: Insufficient documentation

## 2020-05-30 DIAGNOSIS — E785 Hyperlipidemia, unspecified: Secondary | ICD-10-CM | POA: Diagnosis not present

## 2020-05-30 DIAGNOSIS — F418 Other specified anxiety disorders: Secondary | ICD-10-CM | POA: Diagnosis not present

## 2020-05-30 DIAGNOSIS — I10 Essential (primary) hypertension: Secondary | ICD-10-CM | POA: Insufficient documentation

## 2020-05-30 DIAGNOSIS — Z5111 Encounter for antineoplastic chemotherapy: Secondary | ICD-10-CM | POA: Diagnosis not present

## 2020-05-30 DIAGNOSIS — Z7982 Long term (current) use of aspirin: Secondary | ICD-10-CM | POA: Diagnosis not present

## 2020-05-30 DIAGNOSIS — I700 Atherosclerosis of aorta: Secondary | ICD-10-CM | POA: Diagnosis not present

## 2020-05-30 LAB — CBC WITH DIFFERENTIAL (CANCER CENTER ONLY)
Abs Immature Granulocytes: 0.01 10*3/uL (ref 0.00–0.07)
Basophils Absolute: 0.1 10*3/uL (ref 0.0–0.1)
Basophils Relative: 1 %
Eosinophils Absolute: 0.1 10*3/uL (ref 0.0–0.5)
Eosinophils Relative: 2 %
HCT: 41.8 % (ref 36.0–46.0)
Hemoglobin: 13.5 g/dL (ref 12.0–15.0)
Immature Granulocytes: 0 %
Lymphocytes Relative: 27 %
Lymphs Abs: 1.9 10*3/uL (ref 0.7–4.0)
MCH: 29.1 pg (ref 26.0–34.0)
MCHC: 32.3 g/dL (ref 30.0–36.0)
MCV: 90.1 fL (ref 80.0–100.0)
Monocytes Absolute: 0.6 10*3/uL (ref 0.1–1.0)
Monocytes Relative: 9 %
Neutro Abs: 4.4 10*3/uL (ref 1.7–7.7)
Neutrophils Relative %: 61 %
Platelet Count: 287 10*3/uL (ref 150–400)
RBC: 4.64 MIL/uL (ref 3.87–5.11)
RDW: 17.1 % — ABNORMAL HIGH (ref 11.5–15.5)
WBC Count: 7.1 10*3/uL (ref 4.0–10.5)
nRBC: 0 % (ref 0.0–0.2)

## 2020-05-30 LAB — CMP (CANCER CENTER ONLY)
ALT: 20 U/L (ref 0–44)
AST: 24 U/L (ref 15–41)
Albumin: 4.1 g/dL (ref 3.5–5.0)
Alkaline Phosphatase: 98 U/L (ref 38–126)
Anion gap: 13 (ref 5–15)
BUN: 16 mg/dL (ref 8–23)
CO2: 23 mmol/L (ref 22–32)
Calcium: 10.5 mg/dL — ABNORMAL HIGH (ref 8.9–10.3)
Chloride: 105 mmol/L (ref 98–111)
Creatinine: 1.2 mg/dL — ABNORMAL HIGH (ref 0.44–1.00)
GFR, Est AFR Am: 51 mL/min — ABNORMAL LOW (ref >60)
GFR, Estimated: 44 mL/min — ABNORMAL LOW (ref >60)
Glucose, Bld: 106 mg/dL — ABNORMAL HIGH (ref 70–99)
Potassium: 4.5 mmol/L (ref 3.5–5.1)
Sodium: 141 mmol/L (ref 135–145)
Total Bilirubin: 0.6 mg/dL (ref 0.3–1.2)
Total Protein: 7.3 g/dL (ref 6.5–8.1)

## 2020-05-30 LAB — LIPID PANEL
Cholesterol: 169 mg/dL (ref 0–200)
HDL: 89 mg/dL (ref >40)
LDL Cholesterol: 68 mg/dL (ref 0–99)
Total CHOL/HDL Ratio: 1.9 RATIO
Triglycerides: 61 mg/dL (ref <150)
VLDL: 12 mg/dL (ref 0–40)

## 2020-06-01 ENCOUNTER — Other Ambulatory Visit: Payer: Self-pay

## 2020-06-01 ENCOUNTER — Encounter: Payer: Self-pay | Admitting: Internal Medicine

## 2020-06-01 ENCOUNTER — Inpatient Hospital Stay (HOSPITAL_BASED_OUTPATIENT_CLINIC_OR_DEPARTMENT_OTHER): Payer: Medicare Other | Admitting: Internal Medicine

## 2020-06-01 VITALS — BP 162/99 | HR 113 | Temp 98.1°F | Resp 18 | Ht 63.5 in | Wt 186.2 lb

## 2020-06-01 DIAGNOSIS — F418 Other specified anxiety disorders: Secondary | ICD-10-CM | POA: Diagnosis not present

## 2020-06-01 DIAGNOSIS — I10 Essential (primary) hypertension: Secondary | ICD-10-CM | POA: Diagnosis not present

## 2020-06-01 DIAGNOSIS — C3490 Malignant neoplasm of unspecified part of unspecified bronchus or lung: Secondary | ICD-10-CM

## 2020-06-01 DIAGNOSIS — C3491 Malignant neoplasm of unspecified part of right bronchus or lung: Secondary | ICD-10-CM

## 2020-06-01 DIAGNOSIS — E785 Hyperlipidemia, unspecified: Secondary | ICD-10-CM | POA: Diagnosis not present

## 2020-06-01 DIAGNOSIS — C3411 Malignant neoplasm of upper lobe, right bronchus or lung: Secondary | ICD-10-CM | POA: Diagnosis not present

## 2020-06-01 DIAGNOSIS — R011 Cardiac murmur, unspecified: Secondary | ICD-10-CM | POA: Diagnosis not present

## 2020-06-01 DIAGNOSIS — J449 Chronic obstructive pulmonary disease, unspecified: Secondary | ICD-10-CM | POA: Diagnosis not present

## 2020-06-01 NOTE — Progress Notes (Signed)
[facility] [phone]   [phone]  OFFICE PROGRESS NOTE  Forrest Moron, MD [phone] [place] [address]  DIAGNOSIS: Stage IIIA (T2a, N2, M0) non-small cell lung cancer, squamous cell carcinoma presented with large right upper lobe lung mass with right hilar involvement diagnosed in November 2016.  PRIOR THERAPY:  1) Neoadjuvant systemic chemotherapy with carboplatin for AUC of 6 and paclitaxel 200 MG/M2 every 3 weeks, status post 3 cycles with partial response. 2) Video bronchoscopy, right video-assisted thoracoscopy, minithoracotomy, right upper lobectomy with lymph node dissection on 01/18/2016. The final pathology revealed residual squamous cell carcinoma measuring 3.1 cm with close resection margin and metastatic carcinoma to lymph nodes at levels 10R, 12R and 4R. (ypT2a, yN2).  3) Concurrent chemoradiation with weekly carboplatin for AUC of 2 and paclitaxel 45 MG/M2. First dose 02/27/2016. Status post 5 cycles. Last dose was given 03/26/2016. 4) SBRT to left lower lobe lung nodule under the care of Dr. Sondra Come completed on 11/06/2016.  CURRENT THERAPY: Observation.  INTERVAL HISTORY: Tammy Ball 79 y.o. female returns to the clinic today for follow-up visit.  The patient is feeling fine today with no concerning complaints except for shortness of breath with exertion.  She denied having any chest pain, cough or hemoptysis.  She denied having any weight loss or night sweats.  She has no nausea, vomiting, diarrhea or constipation.  She has no headache or visual changes.  She has been in observation for the last 4 years.  The patient is here today for evaluation and repeat CT scan of the chest for restaging of her disease.   MEDICAL HISTORY: Past Medical History:  Diagnosis Date  . Anemia    was while doing chemo  . Anxiety   . Asthma   . Chemotherapy-induced neuropathy ([place]) 11/01/2015  . Claustrophobia    . COPD (chronic obstructive pulmonary disease) ([place])   . Depression   . Encounter for antineoplastic chemotherapy 02/09/2016  . Glaucoma   . Headache    prior to menopause  . Heart murmur   . History of echocardiogram    Echo 2/17: EF 60-73%, grade 1 diastolic dysfunction, mild MR, trivial pericardial effusion  . History of nuclear stress test    Myoview 2/17: no ischemia or scar, EF 79%; low risk  . History of radiation therapy 10/30/16-11/06/16  . Hyperlipidemia   . Hypertension   . Insomnia 05/16/2016  . Non-small cell carcinoma of right lung, stage 2 ([place]) 10/03/2015  . Radiation 02/29/16-04/10/16   50.4 Gy to right central chest  . Renal cell carcinoma ([place])    L nephrectomy  in 2012    ALLERGIES:  is allergic to bee venom, amlodipine, levofloxacin, and hctz [hydrochlorothiazide].  MEDICATIONS:  Current Outpatient Medications  Medication Sig Dispense Refill  . acetaminophen (TYLENOL) 500 MG tablet Take 500 mg by mouth every 6 (six) hours as needed for mild pain, moderate pain, fever or headache. Reported on 05/24/2016    . albuterol (PROVENTIL) (2.5 MG/3ML) 0.083% nebulizer solution Take 3 mLs (2.5 mg total) by nebulization every 6 (six) hours as needed for wheezing or shortness of breath. 75 mL 12  . albuterol (VENTOLIN HFA) 108 (90 Base) MCG/ACT inhaler Inhale 1-2 puffs into the lungs every 6 (six) hours as needed for wheezing or shortness of breath. 1 Inhaler 6  . Ascorbic Acid (VITAMIN C) 1000 MG tablet Take 1,000 mg by mouth daily.    Marland Kitchen  aspirin EC 81 MG tablet Take 81 mg by mouth daily. Reported on 04/11/2016    . atorvastatin (LIPITOR) 40 MG tablet TAKE 1 TABLET BY MOUTH EVERY DAY 90 tablet 1  . calcium carbonate (TUMS EX) 750 MG chewable tablet Chew 1 tablet by mouth as needed for heartburn.    . Carboxymethylcellulose Sodium (ARTIFICIAL TEARS OP) Place 1 drop into both eyes daily as needed (dry eyes).    . Cholecalciferol (VITAMIN D3 PO) Take 1,200 mg by mouth daily.    .  clonazePAM (KLONOPIN) 0.5 MG tablet Take 0.5 tablets (0.25 mg total) by mouth daily as needed for anxiety. 30 tablet 0  . fish oil-omega-3 fatty acids 1000 MG capsule Take 1 capsule by mouth daily.     . fluticasone (FLONASE) 50 MCG/ACT nasal spray Place 2 sprays into both nostrils daily. (Patient not taking: Reported on 03/07/2020) 16 g 2  . Fluticasone-Umeclidin-Vilant (TRELEGY ELLIPTA) 100-62.5-25 MCG/INH AEPB Inhale 1 puff into the lungs daily. 28 each 0  . losartan (COZAAR) 50 MG tablet Take 1 tablet (50 mg total) by mouth daily. 90 tablet 1  . OXYGEN Inhale 2 L into the lungs continuous.    . sodium chloride (OCEAN) 0.65 % SOLN nasal spray Place 1 spray into both nostrils as needed for congestion.    Marland Kitchen tiZANidine (ZANAFLEX) 2 MG tablet TAKE 1 TABLET BY MOUTH 3 TIMES A DAY AS NEEDED FOR SPASMS 90 tablet 1  . traMADol (ULTRAM) 50 MG tablet Take 1 tablet (50 mg total) by mouth every 6 (six) hours as needed (BACK PAIN). 90 tablet 0  . traZODone (DESYREL) 50 MG tablet TAKE 1 TO 2 TABLETS BY MOUTH AT BEDTIME AS NEEDED FOR SLEEP 90 tablet 0  . TRELEGY ELLIPTA 100-62.5-25 MCG/INH AEPB INHALE 1 PUFF BY MOUTH EVERY DAY 60 each 11  . zinc gluconate 50 MG tablet Take 50 mg by mouth daily.     Current Facility-Administered Medications  Medication Dose Route Frequency Provider Last Rate Last Admin  . acetaminophen (TYLENOL) tablet 500 mg  500 mg Oral Once Forrest Moron, MD        SURGICAL HISTORY:  Past Surgical History:  Procedure Laterality Date  . BACK SURGERY     cervical 1991  . EYE SURGERY    . IR GENERIC HISTORICAL  01/16/2017   IR RADIOLOGIST EVAL & MGMT 01/16/2017 [facility]  . IR GENERIC HISTORICAL  01/31/2017   IR FLUORO GUIDED NEEDLE PLC ASPIRATION/INJECTION LOC 01/31/2017 Luanne Bras, MD [facility]  . IR GENERIC HISTORICAL  01/31/2017   IR VERTEBROPLASTY CERV/THOR BX INC UNI/BIL INC/INJECT/IMAGING 01/31/2017 Luanne Bras, MD [facility]  . IR GENERIC HISTORICAL   01/31/2017   IR VERTEBROPLASTY EA ADDL (T&LS) BX INC UNI/BIL INC INJECT/IMAGING 01/31/2017 Luanne Bras, MD [facility]  . kidney cancer    . NEPHRECTOMY    . SPINE SURGERY    . TUBAL LIGATION    . VIDEO ASSISTED THORACOSCOPY (VATS)/WEDGE RESECTION Right 01/18/2016   Procedure: VIDEO ASSISTED THORACOSCOPY (VATS)/LUNG RESECTION, THOROCOTOMY, RIGHT UPPER LOBECTOMY, LYMPH NODE DISSECTION, PLACEMENT OF ON Q;  Surgeon: [name], MD;  Location: [facility];  Service: Thoracic;  Laterality: Right;  Marland Kitchen VIDEO BRONCHOSCOPY Bilateral 09/20/2015   Procedure: VIDEO BRONCHOSCOPY WITHOUT FLUORO;  Surgeon: Rigoberto Noel, MD;  Location: [facility];  Service: Cardiopulmonary;  Laterality: Bilateral;  . VIDEO BRONCHOSCOPY N/A 01/18/2016   Procedure: VIDEO BRONCHOSCOPY;  Surgeon: [name], MD;  Location: [facility];  Service: Thoracic;  Laterality: N/A;    REVIEW OF SYSTEMS:  A comprehensive review of systems was negative except for: Respiratory: positive for dyspnea on exertion   PHYSICAL EXAMINATION: General appearance: alert, cooperative and no distress Head: Normocephalic, without obvious abnormality, atraumatic Neck: no adenopathy, no JVD, supple, symmetrical, trachea midline and thyroid not enlarged, symmetric, no tenderness/mass/nodules Lymph nodes: Cervical, supraclavicular, and axillary nodes normal. Resp: clear to auscultation bilaterally Back: symmetric, no curvature. ROM normal. No CVA tenderness. Cardio: regular rate and rhythm, S1, S2 normal, no murmur, click, rub or gallop GI: soft, non-tender; bowel sounds normal; no masses,  no organomegaly Extremities: extremities normal, atraumatic, no cyanosis or edema  ECOG PERFORMANCE STATUS: 1 - Symptomatic but completely ambulatory  Blood pressure (!) 162/99, pulse (!) 113, temperature 98.1 F (36.7 C), temperature source Temporal, resp. rate 18, height 5' 3.5" (1.613 m), weight 186 lb 3.2 oz (84.5 kg), SpO2 96 %.  LABORATORY DATA: Lab  Results  Component Value Date   WBC 7.1 05/30/2020   HGB 13.5 05/30/2020   HCT 41.8 05/30/2020   MCV 90.1 05/30/2020   PLT 287 05/30/2020      Chemistry      Component Value Date/Time   NA 141 05/30/2020 1103   NA 140 11/21/2018 1058   NA 140 11/11/2017 0939   K 4.5 05/30/2020 1103   K 4.6 11/11/2017 0939   CL 105 05/30/2020 1103   CO2 23 05/30/2020 1103   CO2 28 11/11/2017 0939   BUN 16 05/30/2020 1103   BUN 23 11/21/2018 1058   BUN 30.3 (H) 11/11/2017 0939   CREATININE 1.20 (H) 05/30/2020 1103   CREATININE 1.3 (H) 11/11/2017 0939   GLU 105 12/10/2009 0000      Component Value Date/Time   CALCIUM 10.5 (H) 05/30/2020 1103   CALCIUM 10.0 11/11/2017 0939   ALKPHOS 98 05/30/2020 1103   ALKPHOS 102 11/11/2017 0939   AST 24 05/30/2020 1103   AST 18 11/11/2017 0939   ALT 20 05/30/2020 1103   ALT 19 11/11/2017 0939   BILITOT 0.6 05/30/2020 1103   BILITOT 0.42 11/11/2017 0939       RADIOGRAPHIC STUDIES: CT Chest Wo Contrast  Result Date: 05/30/2020 CLINICAL DATA:  Non-small cell lung cancer staging. Post radiation therapy in 2016, radiation and chemo completed in 2017 with history of kyphoplasty and LEFT renal cell carcinoma. EXAM: CT CHEST WITHOUT CONTRAST TECHNIQUE: Multidetector CT imaging of the chest was performed following the standard protocol without IV contrast. COMPARISON:  Multiple prior studies most recent 12/01/2019 FINDINGS: Cardiovascular: Calcified plaque in the thoracic aorta. Ectatic descending thoracic aorta. Limited assessment given lack of intravenous contrast. Central pulmonary vasculature mildly engorged similar to previous imaging. Heart size is stable with three-vessel coronary artery calcification. Mediastinum/Nodes: No thoracic inlet adenopathy. No hilar adenopathy to the extent evaluated on noncontrast imaging. No axillary adenopathy. No mediastinal adenopathy. Esophagus grossly normal. Lungs/Pleura: Pleural and parenchymal scarring throughout the  chest on the RIGHT and at the LEFT lung base. LEFT lung base changes are quite similar to May 12, 2018 when viewed in the sagittal plane rather than just the axial plane but with some increasing density seen along the anterior margin some of this could be due to changes in diaphragmatic position. Upper Abdomen: Adrenal glands are normal. No acute upper abdominal process the extent evaluated. Musculoskeletal: Osteopenia. Spinal degenerative changes. Multilevel cement augmentation of vertebral bodies and cervicothoracic spinal fusion with similar appearance IMPRESSION: 1. Postoperative and post treatment changes in the RIGHT chest  without change since previous imaging. 2. Pleural and parenchymal scarring in the LEFT lung base appearing slightly increased since the most recent comparison but with stable appearance accounting for diaphragmatic position and with discoid morphology when viewed in the sagittal plane. Attention on follow-up. 3. No signs of metastatic disease elsewhere in the chest 4. Aortic atherosclerosis. Aortic Atherosclerosis (ICD10-I70.0). Electronically Signed   By: Zetta Bills M.D.   On: 05/30/2020 14:56    ASSESSMENT AND PLAN:  This is a very pleasant 78 years old white female with a stage IIIa non-small cell lung cancer, squamous cell carcinoma status post neoadjuvant systemic chemotherapy was carboplatin and paclitaxel with partial response followed by right upper lobectomy and lymph node dissection. She was found to have evidence of metastatic disease to the mediastinal lymph nodes and close resection margin. The patient underwent a course of concurrent chemoradiation with weekly carboplatin and paclitaxel. She was also treated with SBRT left lower lobe pulmonary nodule under the care of Dr. Sondra Come. The patient has been on observation for more than 4 years now.  She is feeling fine with no concerning complaints. She had repeat CT scan of the chest performed recently.  I personally and  independently reviewed the scan and discussed the results with the patient today. Her scan showed no concerning findings for disease progression. I recommended for her to continue on observation with repeat CT scan of the chest in 6 months. For the hypertension, I strongly encouraged the patient to take her blood pressure medications and to monitor it closely at home. The patient was advised to call immediately if she has any concerning symptoms in the interval. The patient voices understanding of current disease status and treatment options and is in agreement with the current care plan. All questions were answered. The patient knows to call the clinic with any problems, questions or concerns. We can certainly see the patient much sooner if necessary.  Disclaimer: This note was dictated with voice recognition software. Similar sounding words can inadvertently be transcribed and may not be corrected upon review.

## 2020-06-01 NOTE — Addendum Note (Signed)
Addended by: Ardeen Garland on: 06/01/2020 10:20 AM   Modules accepted: Orders

## 2020-06-02 ENCOUNTER — Telehealth: Payer: Self-pay | Admitting: Internal Medicine

## 2020-06-02 NOTE — Telephone Encounter (Signed)
Scheduled per los. Called and left msg. Mailed printout  °

## 2020-08-07 DIAGNOSIS — Z23 Encounter for immunization: Secondary | ICD-10-CM | POA: Diagnosis not present

## 2020-09-07 DIAGNOSIS — H52223 Regular astigmatism, bilateral: Secondary | ICD-10-CM | POA: Diagnosis not present

## 2020-09-07 DIAGNOSIS — H18593 Other hereditary corneal dystrophies, bilateral: Secondary | ICD-10-CM | POA: Diagnosis not present

## 2020-09-07 DIAGNOSIS — H5213 Myopia, bilateral: Secondary | ICD-10-CM | POA: Diagnosis not present

## 2020-09-07 DIAGNOSIS — H40013 Open angle with borderline findings, low risk, bilateral: Secondary | ICD-10-CM | POA: Diagnosis not present

## 2020-09-07 DIAGNOSIS — H04123 Dry eye syndrome of bilateral lacrimal glands: Secondary | ICD-10-CM | POA: Diagnosis not present

## 2020-09-07 DIAGNOSIS — H524 Presbyopia: Secondary | ICD-10-CM | POA: Diagnosis not present

## 2020-09-07 DIAGNOSIS — Z961 Presence of intraocular lens: Secondary | ICD-10-CM | POA: Diagnosis not present

## 2020-11-05 ENCOUNTER — Encounter (HOSPITAL_COMMUNITY): Payer: Self-pay

## 2020-11-05 ENCOUNTER — Other Ambulatory Visit: Payer: Self-pay

## 2020-11-05 ENCOUNTER — Emergency Department (HOSPITAL_COMMUNITY): Payer: Medicare Other

## 2020-11-05 ENCOUNTER — Emergency Department (HOSPITAL_COMMUNITY)
Admission: EM | Admit: 2020-11-05 | Discharge: 2020-11-05 | Disposition: A | Discharge status: Home / Self Care | Payer: Medicare Other | Attending: Emergency Medicine | Admitting: Emergency Medicine

## 2020-11-05 DIAGNOSIS — R0602 Shortness of breath: Secondary | ICD-10-CM | POA: Diagnosis not present

## 2020-11-05 DIAGNOSIS — J449 Chronic obstructive pulmonary disease, unspecified: Secondary | ICD-10-CM | POA: Diagnosis not present

## 2020-11-05 DIAGNOSIS — R509 Fever, unspecified: Secondary | ICD-10-CM | POA: Diagnosis not present

## 2020-11-05 DIAGNOSIS — Z87891 Personal history of nicotine dependence: Secondary | ICD-10-CM | POA: Insufficient documentation

## 2020-11-05 DIAGNOSIS — R0902 Hypoxemia: Secondary | ICD-10-CM | POA: Diagnosis not present

## 2020-11-05 DIAGNOSIS — Z79899 Other long term (current) drug therapy: Secondary | ICD-10-CM | POA: Insufficient documentation

## 2020-11-05 DIAGNOSIS — Z7982 Long term (current) use of aspirin: Secondary | ICD-10-CM | POA: Insufficient documentation

## 2020-11-05 DIAGNOSIS — Z85118 Personal history of other malignant neoplasm of bronchus and lung: Secondary | ICD-10-CM | POA: Diagnosis not present

## 2020-11-05 DIAGNOSIS — Z85528 Personal history of other malignant neoplasm of kidney: Secondary | ICD-10-CM | POA: Diagnosis not present

## 2020-11-05 DIAGNOSIS — Z20822 Contact with and (suspected) exposure to covid-19: Secondary | ICD-10-CM | POA: Diagnosis not present

## 2020-11-05 DIAGNOSIS — R531 Weakness: Secondary | ICD-10-CM | POA: Diagnosis not present

## 2020-11-05 DIAGNOSIS — R42 Dizziness and giddiness: Secondary | ICD-10-CM | POA: Diagnosis not present

## 2020-11-05 DIAGNOSIS — R197 Diarrhea, unspecified: Secondary | ICD-10-CM | POA: Diagnosis not present

## 2020-11-05 DIAGNOSIS — R1084 Generalized abdominal pain: Secondary | ICD-10-CM | POA: Insufficient documentation

## 2020-11-05 DIAGNOSIS — Z7951 Long term (current) use of inhaled steroids: Secondary | ICD-10-CM | POA: Insufficient documentation

## 2020-11-05 DIAGNOSIS — I959 Hypotension, unspecified: Secondary | ICD-10-CM | POA: Diagnosis not present

## 2020-11-05 DIAGNOSIS — A419 Sepsis, unspecified organism: Secondary | ICD-10-CM | POA: Diagnosis not present

## 2020-11-05 DIAGNOSIS — R110 Nausea: Secondary | ICD-10-CM | POA: Insufficient documentation

## 2020-11-05 DIAGNOSIS — I10 Essential (primary) hypertension: Secondary | ICD-10-CM | POA: Diagnosis not present

## 2020-11-05 LAB — CBC WITH DIFFERENTIAL/PLATELET
Abs Immature Granulocytes: 0.12 10*3/uL — ABNORMAL HIGH (ref 0.00–0.07)
Basophils Absolute: 0 10*3/uL (ref 0.0–0.1)
Basophils Relative: 0 %
Eosinophils Absolute: 0 10*3/uL (ref 0.0–0.5)
Eosinophils Relative: 0 %
HCT: 37.1 % (ref 36.0–46.0)
Hemoglobin: 12 g/dL (ref 12.0–15.0)
Immature Granulocytes: 1 %
Lymphocytes Relative: 8 %
Lymphs Abs: 1.5 10*3/uL (ref 0.7–4.0)
MCH: 30.3 pg (ref 26.0–34.0)
MCHC: 32.3 g/dL (ref 30.0–36.0)
MCV: 93.7 fL (ref 80.0–100.0)
Monocytes Absolute: 1.6 10*3/uL — ABNORMAL HIGH (ref 0.1–1.0)
Monocytes Relative: 9 %
Neutro Abs: 14.5 10*3/uL — ABNORMAL HIGH (ref 1.7–7.7)
Neutrophils Relative %: 82 %
Platelets: 222 10*3/uL (ref 150–400)
RBC: 3.96 MIL/uL (ref 3.87–5.11)
RDW: 16.5 % — ABNORMAL HIGH (ref 11.5–15.5)
WBC: 17.8 10*3/uL — ABNORMAL HIGH (ref 4.0–10.5)
nRBC: 0 % (ref 0.0–0.2)

## 2020-11-05 LAB — URINALYSIS, ROUTINE W REFLEX MICROSCOPIC
Bacteria, UA: NONE SEEN
Bilirubin Urine: NEGATIVE
Glucose, UA: NEGATIVE mg/dL
Hgb urine dipstick: NEGATIVE
Ketones, ur: NEGATIVE mg/dL
Nitrite: NEGATIVE
Protein, ur: 30 mg/dL — AB
Specific Gravity, Urine: 1.019 (ref 1.005–1.030)
WBC, UA: >50 WBC/hpf — ABNORMAL HIGH (ref 0–5)
pH: 5 (ref 5.0–8.0)

## 2020-11-05 LAB — COMPREHENSIVE METABOLIC PANEL
ALT: 14 U/L (ref 0–44)
AST: 17 U/L (ref 15–41)
Albumin: 3.3 g/dL — ABNORMAL LOW (ref 3.5–5.0)
Alkaline Phosphatase: 73 U/L (ref 38–126)
Anion gap: 11 (ref 5–15)
BUN: 21 mg/dL (ref 8–23)
CO2: 24 mmol/L (ref 22–32)
Calcium: 8.5 mg/dL — ABNORMAL LOW (ref 8.9–10.3)
Chloride: 96 mmol/L — ABNORMAL LOW (ref 98–111)
Creatinine, Ser: 1.79 mg/dL — ABNORMAL HIGH (ref 0.44–1.00)
GFR, Estimated: 29 mL/min — ABNORMAL LOW (ref >60)
Glucose, Bld: 122 mg/dL — ABNORMAL HIGH (ref 70–99)
Potassium: 4.6 mmol/L (ref 3.5–5.1)
Sodium: 131 mmol/L — ABNORMAL LOW (ref 135–145)
Total Bilirubin: 1.3 mg/dL — ABNORMAL HIGH (ref 0.3–1.2)
Total Protein: 6.6 g/dL (ref 6.5–8.1)

## 2020-11-05 LAB — RESP PANEL BY RT-PCR (FLU A&B, COVID) ARPGX2
Influenza A by PCR: NEGATIVE
Influenza B by PCR: NEGATIVE
SARS Coronavirus 2 by RT PCR: NEGATIVE

## 2020-11-05 LAB — LACTIC ACID, PLASMA: Lactic Acid, Venous: 1.4 mmol/L (ref 0.5–1.9)

## 2020-11-05 MED ORDER — ONDANSETRON HCL 4 MG PO TABS: 4.0000 mg | ORAL_TABLET | Freq: Four times a day (QID) | ORAL | 0 refills | Status: DC

## 2020-11-05 MED ORDER — DOXYCYCLINE HYCLATE 100 MG PO CAPS: 100.0000 mg | ORAL_CAPSULE | Freq: Two times a day (BID) | ORAL | 0 refills | Status: DC

## 2020-11-05 MED ORDER — LACTATED RINGERS IV BOLUS (SEPSIS)
1000.0000 mL | Freq: Once | INTRAVENOUS | Status: AC
  Administered 2020-11-05: 1000 mL via INTRAVENOUS

## 2020-11-05 NOTE — ED Triage Notes (Signed)
States she was sitting on commode around 9 am and felt like she was going to pass out, "crawled to the phone" states she feels so weak now, like she is not able to walk, states she has never felt this bad, no acute distress, states her back hurts and that is a chronic problem for her, diarrhea since this am around 0900 - uncontrollable, abd pain is only slight of a 2 on 1 to 10 scale, pleasant and cooperative

## 2020-11-05 NOTE — ED Notes (Signed)
Returned from CT, Placed back on 2l  Oxygen via [place], daughter remains at bedside

## 2020-11-05 NOTE — ED Notes (Signed)
Patient transported to CT 

## 2020-11-05 NOTE — Discharge Instructions (Signed)
Follow-up with your primary care doctor to have your kidney function rechecked next week.  If you continue to have worsening diarrhea, nausea, vomiting, fever, weakness, pain please return to the emergency department as discussed.  Take antibiotics as prescribed.  Follow-up with your oncology team for likely PET scan to further evaluate for lung mass.

## 2020-11-05 NOTE — ED Provider Notes (Signed)
[facility] DEPT Provider Note   CSN: [id number] Arrival date & time: 11/05/20  1037     History Chief Complaint  Patient presents with  . Diarrhea    States she was "woozy" while on commode and felt like she would pass out, though did not pass out  . Abdominal Pain  . Fever  . Fall    ELINE GENG is a 79 y.o. female.  The history is provided by the patient.  Fever Max temp prior to arrival:  102 Temp source:  Oral Severity:  Mild Onset quality:  Gradual Duration:  3 days Timing:  Intermittent Progression:  Waxing and waning Chronicity:  New Relieved by:  Acetaminophen Worsened by:  Nothing Associated symptoms: diarrhea and nausea   Associated symptoms: no chest pain, no chills, no confusion, no congestion, no cough, no dysuria, no ear pain, no headaches, no myalgias, no rash, no rhinorrhea, no somnolence, no sore throat and no vomiting   Risk factors: sick contacts        Past Medical History:  Diagnosis Date  . Anemia    was while doing chemo  . Anxiety   . Asthma   . Chemotherapy-induced neuropathy ([place]) 11/01/2015  . Claustrophobia   . COPD (chronic obstructive pulmonary disease) ([place])   . Depression   . Encounter for antineoplastic chemotherapy 02/09/2016  . Glaucoma   . Headache    prior to menopause  . Heart murmur   . History of echocardiogram    Echo 2/17: EF 44-03%, grade 1 diastolic dysfunction, mild MR, trivial pericardial effusion  . History of nuclear stress test    Myoview 2/17: no ischemia or scar, EF 79%; low risk  . History of radiation therapy 10/30/16-11/06/16  . Hyperlipidemia   . Hypertension   . Insomnia 05/16/2016  . Non-small cell carcinoma of right lung, stage 2 ([place]) 10/03/2015  . Radiation 02/29/16-04/10/16   50.4 Gy to right central chest  . Renal cell carcinoma ([place])    L nephrectomy  in 2012    Patient Active Problem List   Diagnosis Date Noted  . HCAP (healthcare-associated pneumonia)  01/07/2019  . Allergic rhinitis 01/06/2019  . Chronic respiratory failure with hypoxia ([place]) 12/26/2018  . Age-related osteoporosis with current pathological fracture 11/21/2018  . Renal insufficiency 05/20/2018  . History of compression fracture of spine 12/10/2016  . Hypercalcemia 06/25/2016  . Insomnia 05/16/2016  . Pneumonitis, radiation ([place]) 04/11/2016  . S/P lobectomy of lung 01/18/2016  . Chemotherapy-induced neuropathy ([place]) 11/01/2015  . Non-small cell carcinoma of right lung, stage 2 ([place]) 10/03/2015  . Hyperlipidemia 08/14/2013  . Spondylolisthesis of lumbar region 07/03/2013  . Essential hypertension 12/17/2011  . COPD GOLD II  12/17/2011  . Renal cell cancer ([place]) 12/17/2011  . Sciatica of left side 12/17/2011  . Depression 12/17/2011    Past Surgical History:  Procedure Laterality Date  . BACK SURGERY     cervical 1991  . EYE SURGERY    . IR GENERIC HISTORICAL  01/16/2017   IR RADIOLOGIST EVAL & MGMT 01/16/2017 [facility]  . IR GENERIC HISTORICAL  01/31/2017   IR FLUORO GUIDED NEEDLE PLC ASPIRATION/INJECTION LOC 01/31/2017 Luanne Bras, MD [facility]  . IR GENERIC HISTORICAL  01/31/2017   IR VERTEBROPLASTY CERV/THOR BX INC UNI/BIL INC/INJECT/IMAGING 01/31/2017 Luanne Bras, MD [facility]  . IR GENERIC HISTORICAL  01/31/2017   IR VERTEBROPLASTY EA ADDL (T&LS) BX INC UNI/BIL INC INJECT/IMAGING 01/31/2017 Luanne Bras, MD [facility]  .  kidney cancer    . NEPHRECTOMY    . SPINE SURGERY    . TUBAL LIGATION    . VIDEO ASSISTED THORACOSCOPY (VATS)/WEDGE RESECTION Right 01/18/2016   Procedure: VIDEO ASSISTED THORACOSCOPY (VATS)/LUNG RESECTION, THOROCOTOMY, RIGHT UPPER LOBECTOMY, LYMPH NODE DISSECTION, PLACEMENT OF ON Q;  Surgeon: [name], MD;  Location: [facility];  Service: Thoracic;  Laterality: Right;  Marland Kitchen VIDEO BRONCHOSCOPY Bilateral 09/20/2015   Procedure: VIDEO BRONCHOSCOPY WITHOUT FLUORO;  Surgeon: Rigoberto Noel, MD;  Location: [facility];   Service: Cardiopulmonary;  Laterality: Bilateral;  . VIDEO BRONCHOSCOPY N/A 01/18/2016   Procedure: VIDEO BRONCHOSCOPY;  Surgeon: [name], MD;  Location: [facility] OR;  Service: Thoracic;  Laterality: N/A;     OB History   No obstetric history on file.     Family History  Problem Relation Age of Onset  . Heart disease Sister   . Obesity Brother   . Heart attack Daughter 26       s/p CABG  . Glaucoma Daughter   . Breast cancer Sister   . Birth defects Sister   . Cancer Mother        Bladder Cancer  . Hypertension Mother   . CAD Mother 38  . Cancer Maternal Grandmother     Social History   Tobacco Use  . Smoking status: Former Smoker    Packs/day: 1.00    Years: 50.00    Pack years: 50.00    Types: Cigarettes    Quit date: 03/16/2011    Years since quitting: 9.6  . Smokeless tobacco: Never Used  . Tobacco comment: last 4-5 years of smoking, smoked 0.5 pack/day   Vaping Use  . Vaping Use: Never used  Substance Use Topics  . Alcohol use: No    Alcohol/week: 0.0 standard drinks  . Drug use: No    Home Medications Prior to Admission medications   Medication Sig Start Date End Date Taking? Authorizing Provider  acetaminophen (TYLENOL) 500 MG tablet Take 500 mg by mouth every 6 (six) hours as needed for mild pain, moderate pain, fever or headache. Reported on 05/24/2016   Yes [provider]  albuterol (PROVENTIL) (2.5 MG/3ML) 0.083% nebulizer solution Take 3 mLs (2.5 mg total) by nebulization every 6 (six) hours as needed for wheezing or shortness of breath. 12/30/18  Yes Rai, Ripudeep K, MD  albuterol (VENTOLIN HFA) 108 (90 Base) MCG/ACT inhaler Inhale 1-2 puffs into the lungs every 6 (six) hours as needed for wheezing or shortness of breath. 02/13/19  Yes Forrest Moron, MD  Ascorbic Acid (VITAMIN C) 1000 MG tablet Take 1,000 mg by mouth daily.   Yes [provider]  aspirin EC 81 MG tablet Take 81 mg by mouth daily.   Yes [provider]   atorvastatin (LIPITOR) 40 MG tablet TAKE 1 TABLET BY MOUTH EVERY DAY Patient taking differently: Take 40 mg by mouth daily. 01/18/20  Yes Forrest Moron, MD  calcium carbonate (TUMS EX) 750 MG chewable tablet Chew 1 tablet by mouth daily as needed for heartburn.   Yes [provider]  Carboxymethylcellulose Sodium (ARTIFICIAL TEARS OP) Place 1 drop into both eyes daily as needed (dry eyes).   Yes [provider]  carvedilol (COREG) 12.5 MG tablet Take 12.5 mg by mouth 2 (two) times daily. 10/12/20  Yes [provider]  Cholecalciferol (VITAMIN D3 PO) Take 1,200 mg by mouth daily.   Yes [provider]  clonazePAM (KLONOPIN) 0.5 MG tablet Take  0.5 tablets (0.25 mg total) by mouth daily as needed for anxiety. 12/07/19  Yes Stallings, Zoe A, MD  fish oil-omega-3 fatty acids 1000 MG capsule Take 1 capsule by mouth daily.   Yes [provider]  fluticasone (FLONASE) 50 MCG/ACT nasal spray Place 2 sprays into both nostrils daily. 01/11/19  Yes Rai, Ripudeep K, MD  Guaifenesin (MUCINEX MAXIMUM STRENGTH) 1200 MG TB12 Take 1,200 mg by mouth 2 (two) times daily.   Yes [provider]  losartan (COZAAR) 100 MG tablet Take 100 mg by mouth daily. 09/09/20  Yes [provider]  OXYGEN Inhale 2 L into the lungs continuous.   Yes [provider]  sodium chloride (OCEAN) 0.65 % SOLN nasal spray Place 1 spray into both nostrils daily.   Yes [provider]  TRELEGY ELLIPTA 100-62.5-25 MCG/INH AEPB INHALE 1 PUFF BY MOUTH EVERY DAY Patient taking differently: Inhale 1 puff into the lungs daily. 04/21/20  Yes Collene Gobble, MD  losartan (COZAAR) 50 MG tablet Take 1 tablet (50 mg total) by mouth daily. Patient not taking: No sig reported 03/07/20   Delia Chimes A, MD  tiZANidine (ZANAFLEX) 2 MG tablet TAKE 1 TABLET BY MOUTH 3 TIMES A DAY AS NEEDED FOR SPASMS Patient not taking: No sig reported 08/12/19   Forrest Moron, MD  traMADol  (ULTRAM) 50 MG tablet Take 1 tablet (50 mg total) by mouth every 6 (six) hours as needed (BACK PAIN). Patient not taking: No sig reported 06/01/19   Delia Chimes A, MD  traZODone (DESYREL) 50 MG tablet TAKE 1 TO 2 TABLETS BY MOUTH AT BEDTIME AS NEEDED FOR SLEEP Patient not taking: No sig reported 08/26/19   Forrest Moron, MD    Allergies    Bee venom, Amlodipine, Levofloxacin, and Hctz [hydrochlorothiazide]  Review of Systems   Review of Systems  Constitutional: Positive for fever. Negative for chills.  HENT: Negative for congestion, ear pain, rhinorrhea and sore throat.   Eyes: Negative for pain and visual disturbance.  Respiratory: Negative for cough and shortness of breath.   Cardiovascular: Negative for chest pain and palpitations.  Gastrointestinal: Positive for diarrhea and nausea. Negative for abdominal pain and vomiting.  Genitourinary: Negative for dysuria and hematuria.  Musculoskeletal: Negative for arthralgias, back pain and myalgias.  Skin: Negative for color change and rash.  Neurological: Positive for light-headedness (fell off comod being so weak after episode of diarrhea, did not hit head). Negative for dizziness, tremors, seizures, facial asymmetry, speech difficulty, weakness, numbness and headaches.  Psychiatric/Behavioral: Negative for confusion.  All other systems reviewed and are negative.   Physical Exam Updated Vital Signs  ED Triage Vitals [11/05/20 1051]  Enc Vitals Group     BP (!) 93/50     Pulse Rate 72     Resp 17     Temp 98.1 F (36.7 C)     Temp src      SpO2 90 %     Weight      Height      Head Circumference      Peak Flow      Pain Score      Pain Loc      Pain Edu?      Excl. in [place]?     Physical Exam Vitals and nursing note reviewed.  Constitutional:      General: She is not in acute distress.    Appearance: She is well-developed and well-nourished. She is not ill-appearing.  HENT:     Head: Normocephalic and atraumatic.      Mouth/Throat:     Mouth: Mucous membranes are moist.  Eyes:     Extraocular Movements: Extraocular movements intact.     Conjunctiva/sclera: Conjunctivae normal.     Pupils: Pupils are equal, round, and reactive to light.  Cardiovascular:     Rate and Rhythm: Normal rate and regular rhythm.     Heart sounds: Normal heart sounds. No murmur heard.   Pulmonary:     Effort: Pulmonary effort is normal. No respiratory distress.     Breath sounds: Normal breath sounds.  Abdominal:     General: Abdomen is flat. There is no distension.     Palpations: Abdomen is soft.     Tenderness: There is generalized abdominal tenderness. There is no guarding or rebound.     Hernia: No hernia is present.  Musculoskeletal:        General: No edema.     Cervical back: Neck supple.  Skin:    General: Skin is warm and dry.     Capillary Refill: Capillary refill takes less than 2 seconds.  Neurological:     General: No focal deficit present.     Mental Status: She is alert.  Psychiatric:        Mood and Affect: Mood and affect and mood normal.     ED Results / Procedures / Treatments   Labs (all labs ordered are listed, but only abnormal results are displayed) Labs Reviewed  COMPREHENSIVE METABOLIC PANEL - Abnormal; Notable for the following components:      Result Value   Sodium 131 (*)    Chloride 96 (*)    Glucose, Bld 122 (*)    Creatinine, Ser 1.79 (*)    Calcium 8.5 (*)    Albumin 3.3 (*)    Total Bilirubin 1.3 (*)    GFR, Estimated 29 (*)    All other components within normal limits  CBC WITH DIFFERENTIAL/PLATELET - Abnormal; Notable for the following components:   WBC 17.8 (*)    RDW 16.5 (*)    Neutro Abs 14.5 (*)    Monocytes Absolute 1.6 (*)    Abs Immature Granulocytes 0.12 (*)    All other components within normal limits  URINALYSIS, ROUTINE W REFLEX MICROSCOPIC - Abnormal; Notable for the following components:   APPearance CLOUDY (*)    Protein, ur 30 (*)     Leukocytes,Ua LARGE (*)    WBC, UA >50 (*)    Non Squamous Epithelial 0-5 (*)    All other components within normal limits  RESP PANEL BY RT-PCR (FLU A&B, COVID) [id number]  CULTURE, BLOOD (SINGLE)  URINE CULTURE  LACTIC ACID, PLASMA    EKG EKG Interpretation  Date/Time:  Saturday November 05 2020 10:59:58 EST Ventricular Rate:  75 PR Interval:    QRS Duration: 86 QT Interval:  430 QTC Calculation: 481 R Axis:   177 Text Interpretation: Sinus arrhythmia Ventricular premature complex Low voltage, precordial leads Confirmed by Lennice Sites [phone]) on 11/05/2020 11:12:02 AM   Radiology CT ABDOMEN PELVIS WO CONTRAST  Result Date: 11/05/2020 CLINICAL DATA:  Weakness, diarrhea, abdominal pain rated at 2/10, question abdominal abscess/infection; past history of non small cell cancer of the RIGHT lung, renal cell carcinoma post LEFT nephrectomy, COPD, hypertension EXAM: CT ABDOMEN AND PELVIS WITHOUT CONTRAST TECHNIQUE: Multidetector CT imaging of the abdomen and pelvis was performed following the standard protocol without IV contrast. Neither oral nor intravenous contrast  were administered. Sagittal and coronal MPR images reconstructed from axial data set. COMPARISON:  02/06/2011 FINDINGS: Lower chest: Subsegmental atelectasis in LEFT lower lobe with rounded opacity at the base of the LEFT lower lobe which could represent atelectasis, consolidation, or tumor measuring 3.0 x 2.3 cm image 22. Hepatobiliary: Gallbladder and liver normal appearance Pancreas: Normal appearance Spleen: Normal appearance Adrenals/Urinary Tract: Surgical absence of LEFT kidney. LEFT renal bed unremarkable. Adrenal glands, RIGHT kidney, RIGHT ureter, and bladder normal appearance Stomach/Bowel: Normal appendix. Stomach and bowel loops unremarkable Vascular/Lymphatic: Atherosclerotic calcifications aorta and iliac arteries without aneurysm. No adenopathy. Reproductive: Atrophic uterus with unremarkable adnexa Other: No free  air or free fluid. Tiny RIGHT inguinal hernia containing fat. Musculoskeletal: Osseous demineralization. Degenerative changes LEFT hip joint. Facet degenerative changes lower lumbar spine with grade 1 anterolisthesis L4-L5. IMPRESSION: Surgical absence of LEFT kidney. Tiny RIGHT inguinal hernia containing fat. No acute intra-abdominal or intrapelvic abnormalities. Subsegmental atelectasis in LEFT lower lobe with rounded opacity at the base of the LEFT lower lobe which could represent atelectasis, consolidation, or tumor measuring 3.0 x 2.3 cm. When compared to an earlier CT study of 05/12/2018, this opacity appears slightly larger and more rounded in both the axial and sagittal planes; further evaluation by PET-CT recommended to exclude neoplasm. Aortic Atherosclerosis (ICD10-I70.0). Electronically Signed   By: Lavonia Dana M.D.   On: 11/05/2020 14:03   DG Chest Port 1 View  Result Date: 11/05/2020 CLINICAL DATA:  Sepsis EXAM: PORTABLE CHEST 1 VIEW COMPARISON:  01/07/2019, 05/30/2020 FINDINGS: Heart size is normal. Atherosclerotic calcification of the aortic knob. Airspace consolidation within the left mid to lower lung fields. No large pleural fluid collection. No pneumothorax. Degenerative changes of the bilateral shoulders. IMPRESSION: Airspace consolidation within the left mid to lower lung fields, concerning for pneumonia. Radiographic follow-up to resolution is recommended. Electronically Signed   By: Davina Poke D.O.   On: 11/05/2020 11:38    Procedures Procedures (including critical care time)  Medications Ordered in ED Medications  lactated ringers bolus 1,000 mL (0 mLs Intravenous Stopped 11/05/20 1302)    ED Course  I have reviewed the triage vital signs and the nursing notes.  Pertinent labs & imaging results that were available during my care of the patient were reviewed by me and considered in my medical decision making (see chart for details).    MDM Rules/Calculators/A&P                           KAMIKA GOODLOE is a 78 year old female with history of COPD intermittently on oxygen, hypertension, high cholesterol, history of lung cancer who presents the ED with diarrhea, weakness, fever.  Overall normal vitals upon arrival here.  Had a fever of 102 at home today but has low-grade temperature for the last 2 days.  Has known sick contact.  Is vaccinated against Covid.  No suspicious food intake.  Having some abdominal pain.  Had a bad episode of diarrhea this morning and felt too weak to get up off the toilet.  She fell over off the toilet but did not hit her head and did not lose consciousness.  She is not on blood thinners.  She does not have any bony tenderness on exam.  She was given 500 cc of IV fluids with EMS as her blood pressure was in the 80s upon their arrival.  Vital signs are normal here although blood pressure still soft in the 90s.  We will  continue fluid hydration and pursue infectious work-up but overall patient does appear well.  Could be a viral process and will check for Covid.  Could be foodborne as well.  We will get a CT scan of her abdomen given diffuse crampiness.  Patient with white count of 17 but normal lactic acid.  Covid and influenza test negative.  Urinalysis negative for infection.  Creatinine mildly elevated from baseline of 1.2-1.7.  However has been given normal saline bolus with EMS and myself with improvement of vital signs.  Patient no longer having any abdominal pain.  No shortness of breath or cough.  CT scan of her abdomen and pelvis showed no acute intra-abdominal process.  She has a persistent left lower lung mass versus atelectasis versus consolidation.  She follows with oncology and is due for a PET scan in January.  She does not have any specific cough shortness of breath or sputum production but will cover with doxycycline in case there is a hidden lung infection there.  Discussed with patient about possible inpatient stay for further  hydration and care but patient prefers outpatient treatment as she is feeling better after IV fluids.  Her oxygen has been mostly in the low 90s.  She intermittently uses oxygen at home for her COPD.  She overall has clear breath sounds on exam.  Overall suspect a viral process likely foodborne causing her diarrhea and abdominal pain.  Have a lower suspicion for lung infection but will cover with antibiotics after shared decision making.  Told to return to the ED if she developed any worsening symptoms including worsening pain, nausea, vomiting, diarrhea, other concerning symptoms.  Patient was discharged in good condition.  Understands return precautions.  This chart was dictated using voice recognition software.  Despite best efforts to proofread,  errors can occur which can change the documentation meaning.     Final Clinical Impression(s) / ED Diagnoses Final diagnoses:  Diarrhea, unspecified type  Fever, unspecified fever cause  Weakness    Rx / DC Orders ED Discharge Orders    None       Lennice Sites, DO 11/05/20 1529

## 2020-11-06 LAB — URINE CULTURE

## 2020-11-10 ENCOUNTER — Inpatient Hospital Stay: Payer: Medicare Other | Admitting: Emergency Medicine

## 2020-11-10 ENCOUNTER — Emergency Department (HOSPITAL_COMMUNITY): Payer: Medicare Other

## 2020-11-10 ENCOUNTER — Encounter (HOSPITAL_COMMUNITY): Payer: Self-pay

## 2020-11-10 ENCOUNTER — Other Ambulatory Visit: Payer: Self-pay

## 2020-11-10 ENCOUNTER — Inpatient Hospital Stay (HOSPITAL_COMMUNITY)
Admission: EM | Admit: 2020-11-10 | Discharge: 2020-11-14 | DRG: 193 | Disposition: A | Discharge status: Home / Self Care | Payer: Medicare Other | Attending: Internal Medicine | Admitting: Internal Medicine

## 2020-11-10 DIAGNOSIS — Z85528 Personal history of other malignant neoplasm of kidney: Secondary | ICD-10-CM

## 2020-11-10 DIAGNOSIS — Z923 Personal history of irradiation: Secondary | ICD-10-CM

## 2020-11-10 DIAGNOSIS — Z7982 Long term (current) use of aspirin: Secondary | ICD-10-CM | POA: Diagnosis not present

## 2020-11-10 DIAGNOSIS — J9819 Other pulmonary collapse: Secondary | ICD-10-CM | POA: Diagnosis present

## 2020-11-10 DIAGNOSIS — E782 Mixed hyperlipidemia: Secondary | ICD-10-CM

## 2020-11-10 DIAGNOSIS — I2721 Secondary pulmonary arterial hypertension: Secondary | ICD-10-CM | POA: Diagnosis present

## 2020-11-10 DIAGNOSIS — J9621 Acute and chronic respiratory failure with hypoxia: Secondary | ICD-10-CM | POA: Diagnosis present

## 2020-11-10 DIAGNOSIS — Z888 Allergy status to other drugs, medicaments and biological substances status: Secondary | ICD-10-CM | POA: Diagnosis not present

## 2020-11-10 DIAGNOSIS — Z902 Acquired absence of lung [part of]: Secondary | ICD-10-CM | POA: Diagnosis not present

## 2020-11-10 DIAGNOSIS — R5381 Other malaise: Secondary | ICD-10-CM

## 2020-11-10 DIAGNOSIS — H409 Unspecified glaucoma: Secondary | ICD-10-CM | POA: Diagnosis present

## 2020-11-10 DIAGNOSIS — J440 Chronic obstructive pulmonary disease with acute lower respiratory infection: Secondary | ICD-10-CM | POA: Diagnosis present

## 2020-11-10 DIAGNOSIS — Z91030 Bee allergy status: Secondary | ICD-10-CM

## 2020-11-10 DIAGNOSIS — Z79899 Other long term (current) drug therapy: Secondary | ICD-10-CM

## 2020-11-10 DIAGNOSIS — Z883 Allergy status to other anti-infective agents status: Secondary | ICD-10-CM | POA: Diagnosis not present

## 2020-11-10 DIAGNOSIS — J9611 Chronic respiratory failure with hypoxia: Secondary | ICD-10-CM | POA: Diagnosis present

## 2020-11-10 DIAGNOSIS — E785 Hyperlipidemia, unspecified: Secondary | ICD-10-CM | POA: Diagnosis present

## 2020-11-10 DIAGNOSIS — N1831 Chronic kidney disease, stage 3a: Secondary | ICD-10-CM

## 2020-11-10 DIAGNOSIS — Z905 Acquired absence of kidney: Secondary | ICD-10-CM | POA: Diagnosis not present

## 2020-11-10 DIAGNOSIS — J209 Acute bronchitis, unspecified: Secondary | ICD-10-CM

## 2020-11-10 DIAGNOSIS — C3491 Malignant neoplasm of unspecified part of right bronchus or lung: Secondary | ICD-10-CM | POA: Diagnosis present

## 2020-11-10 DIAGNOSIS — Z85118 Personal history of other malignant neoplasm of bronchus and lung: Secondary | ICD-10-CM

## 2020-11-10 DIAGNOSIS — Z9221 Personal history of antineoplastic chemotherapy: Secondary | ICD-10-CM | POA: Diagnosis not present

## 2020-11-10 DIAGNOSIS — I5032 Chronic diastolic (congestive) heart failure: Secondary | ICD-10-CM | POA: Diagnosis present

## 2020-11-10 DIAGNOSIS — J441 Chronic obstructive pulmonary disease with (acute) exacerbation: Secondary | ICD-10-CM | POA: Diagnosis present

## 2020-11-10 DIAGNOSIS — I700 Atherosclerosis of aorta: Secondary | ICD-10-CM | POA: Diagnosis present

## 2020-11-10 DIAGNOSIS — J189 Pneumonia, unspecified organism: Secondary | ICD-10-CM | POA: Diagnosis not present

## 2020-11-10 DIAGNOSIS — Z20822 Contact with and (suspected) exposure to covid-19: Secondary | ICD-10-CM | POA: Diagnosis present

## 2020-11-10 DIAGNOSIS — I10 Essential (primary) hypertension: Secondary | ICD-10-CM | POA: Diagnosis present

## 2020-11-10 DIAGNOSIS — I130 Hypertensive heart and chronic kidney disease with heart failure and stage 1 through stage 4 chronic kidney disease, or unspecified chronic kidney disease: Secondary | ICD-10-CM | POA: Diagnosis present

## 2020-11-10 DIAGNOSIS — Z7951 Long term (current) use of inhaled steroids: Secondary | ICD-10-CM | POA: Diagnosis not present

## 2020-11-10 DIAGNOSIS — N183 Chronic kidney disease, stage 3 unspecified: Secondary | ICD-10-CM

## 2020-11-10 DIAGNOSIS — R059 Cough, unspecified: Secondary | ICD-10-CM | POA: Diagnosis not present

## 2020-11-10 DIAGNOSIS — Z87891 Personal history of nicotine dependence: Secondary | ICD-10-CM

## 2020-11-10 DIAGNOSIS — E875 Hyperkalemia: Secondary | ICD-10-CM | POA: Diagnosis present

## 2020-11-10 LAB — MRSA PCR SCREENING: MRSA by PCR: NEGATIVE

## 2020-11-10 LAB — CBC WITH DIFFERENTIAL/PLATELET
Abs Immature Granulocytes: 0.05 10*3/uL (ref 0.00–0.07)
Basophils Absolute: 0.1 10*3/uL (ref 0.0–0.1)
Basophils Relative: 1 %
Eosinophils Absolute: 0 10*3/uL (ref 0.0–0.5)
Eosinophils Relative: 1 %
HCT: 38.7 % (ref 36.0–46.0)
Hemoglobin: 12.5 g/dL (ref 12.0–15.0)
Immature Granulocytes: 1 %
Lymphocytes Relative: 18 %
Lymphs Abs: 1 10*3/uL (ref 0.7–4.0)
MCH: 29.9 pg (ref 26.0–34.0)
MCHC: 32.3 g/dL (ref 30.0–36.0)
MCV: 92.6 fL (ref 80.0–100.0)
Monocytes Absolute: 0.5 10*3/uL (ref 0.1–1.0)
Monocytes Relative: 9 %
Neutro Abs: 3.7 10*3/uL (ref 1.7–7.7)
Neutrophils Relative %: 70 %
Platelets: 344 10*3/uL (ref 150–400)
RBC: 4.18 MIL/uL (ref 3.87–5.11)
RDW: 15.9 % — ABNORMAL HIGH (ref 11.5–15.5)
WBC: 5.3 10*3/uL (ref 4.0–10.5)
nRBC: 0 % (ref 0.0–0.2)

## 2020-11-10 LAB — COMPREHENSIVE METABOLIC PANEL
ALT: 23 U/L (ref 0–44)
AST: 25 U/L (ref 15–41)
Albumin: 3.4 g/dL — ABNORMAL LOW (ref 3.5–5.0)
Alkaline Phosphatase: 88 U/L (ref 38–126)
Anion gap: 11 (ref 5–15)
BUN: 27 mg/dL — ABNORMAL HIGH (ref 8–23)
CO2: 27 mmol/L (ref 22–32)
Calcium: 9.9 mg/dL (ref 8.9–10.3)
Chloride: 94 mmol/L — ABNORMAL LOW (ref 98–111)
Creatinine, Ser: 1.44 mg/dL — ABNORMAL HIGH (ref 0.44–1.00)
GFR, Estimated: 37 mL/min — ABNORMAL LOW (ref >60)
Glucose, Bld: 101 mg/dL — ABNORMAL HIGH (ref 70–99)
Potassium: 4.5 mmol/L (ref 3.5–5.1)
Sodium: 132 mmol/L — ABNORMAL LOW (ref 135–145)
Total Bilirubin: 0.7 mg/dL (ref 0.3–1.2)
Total Protein: 7.2 g/dL (ref 6.5–8.1)

## 2020-11-10 LAB — CULTURE, BLOOD (SINGLE): Culture: NO GROWTH

## 2020-11-10 LAB — RESP PANEL BY RT-PCR (FLU A&B, COVID) ARPGX2
Influenza A by PCR: NEGATIVE
Influenza B by PCR: NEGATIVE
SARS Coronavirus 2 by RT PCR: NEGATIVE

## 2020-11-10 LAB — EXPECTORATED SPUTUM ASSESSMENT W GRAM STAIN, RFLX TO RESP C

## 2020-11-10 LAB — LACTIC ACID, PLASMA: Lactic Acid, Venous: 0.8 mmol/L (ref 0.5–1.9)

## 2020-11-10 LAB — GROUP A STREP BY PCR: Group A Strep by PCR: NOT DETECTED

## 2020-11-10 MED ORDER — SODIUM CHLORIDE 0.9 % IV SOLN
500.0000 mg | Freq: Once | INTRAVENOUS | Status: AC
  Administered 2020-11-10: 500 mg via INTRAVENOUS
  Filled 2020-11-10: qty 500

## 2020-11-10 MED ORDER — SODIUM CHLORIDE 0.9 % IV SOLN
500.0000 mg | INTRAVENOUS | Status: DC
  Administered 2020-11-11: 500 mg via INTRAVENOUS
  Filled 2020-11-10: qty 500

## 2020-11-10 MED ORDER — IOHEXOL 300 MG/ML  SOLN
75.0000 mL | Freq: Once | INTRAMUSCULAR | Status: AC | PRN
  Administered 2020-11-10: 60 mL via INTRAVENOUS

## 2020-11-10 MED ORDER — TRAMADOL HCL 50 MG PO TABS
50.0000 mg | ORAL_TABLET | Freq: Four times a day (QID) | ORAL | Status: DC | PRN
  Administered 2020-11-10: 50 mg via ORAL
  Filled 2020-11-10: qty 1

## 2020-11-10 MED ORDER — HYDRALAZINE HCL 20 MG/ML IJ SOLN
10.0000 mg | Freq: Three times a day (TID) | INTRAMUSCULAR | Status: DC | PRN
  Administered 2020-11-10: 10 mg via INTRAVENOUS
  Filled 2020-11-10: qty 1

## 2020-11-10 MED ORDER — ACETAMINOPHEN 650 MG RE SUPP: 650.0000 mg | Freq: Four times a day (QID) | RECTAL | Status: DC | PRN

## 2020-11-10 MED ORDER — ATORVASTATIN CALCIUM 40 MG PO TABS
40.0000 mg | ORAL_TABLET | Freq: Every day | ORAL | Status: DC
  Administered 2020-11-11 – 2020-11-14 (×4): 40 mg via ORAL
  Filled 2020-11-10 (×4): qty 1

## 2020-11-10 MED ORDER — FUROSEMIDE 10 MG/ML IJ SOLN
40.0000 mg | Freq: Once | INTRAMUSCULAR | Status: AC
  Administered 2020-11-10: 40 mg via INTRAVENOUS
  Filled 2020-11-10: qty 4

## 2020-11-10 MED ORDER — SODIUM CHLORIDE 0.9 % IV SOLN
2.0000 g | INTRAVENOUS | Status: DC
  Administered 2020-11-11 – 2020-11-12 (×2): 2 g via INTRAVENOUS
  Filled 2020-11-10: qty 1
  Filled 2020-11-10: qty 2

## 2020-11-10 MED ORDER — ACETAMINOPHEN 325 MG PO TABS
650.0000 mg | ORAL_TABLET | Freq: Once | ORAL | Status: AC
  Administered 2020-11-10: 650 mg via ORAL
  Filled 2020-11-10: qty 2

## 2020-11-10 MED ORDER — UMECLIDINIUM BROMIDE 62.5 MCG/INH IN AEPB
1.0000 | INHALATION_SPRAY | Freq: Every day | RESPIRATORY_TRACT | Status: DC
  Filled 2020-11-10: qty 7

## 2020-11-10 MED ORDER — ASPIRIN EC 81 MG PO TBEC
81.0000 mg | DELAYED_RELEASE_TABLET | Freq: Every day | ORAL | Status: DC
  Administered 2020-11-11 – 2020-11-14 (×4): 81 mg via ORAL
  Filled 2020-11-10 (×4): qty 1

## 2020-11-10 MED ORDER — SODIUM CHLORIDE 0.9 % IV SOLN
1.0000 g | Freq: Once | INTRAVENOUS | Status: AC
  Administered 2020-11-10: 1 g via INTRAVENOUS
  Filled 2020-11-10: qty 10

## 2020-11-10 MED ORDER — FLUTICASONE-UMECLIDIN-VILANT 100-62.5-25 MCG/INH IN AEPB: 1.0000 | INHALATION_SPRAY | Freq: Every day | RESPIRATORY_TRACT | Status: DC

## 2020-11-10 MED ORDER — ONDANSETRON HCL 4 MG/2ML IJ SOLN
4.0000 mg | Freq: Three times a day (TID) | INTRAMUSCULAR | Status: DC | PRN
  Administered 2020-11-10: 4 mg via INTRAVENOUS
  Filled 2020-11-10: qty 2

## 2020-11-10 MED ORDER — ALBUTEROL SULFATE (2.5 MG/3ML) 0.083% IN NEBU
2.5000 mg | INHALATION_SOLUTION | Freq: Four times a day (QID) | RESPIRATORY_TRACT | Status: DC | PRN
  Administered 2020-11-10: 2.5 mg via RESPIRATORY_TRACT
  Filled 2020-11-10: qty 3

## 2020-11-10 MED ORDER — POLYETHYLENE GLYCOL 3350 17 G PO PACK
17.0000 g | PACK | Freq: Every day | ORAL | Status: DC | PRN
Start: 2020-11-10 — End: 2020-11-14

## 2020-11-10 MED ORDER — HYDROCODONE-ACETAMINOPHEN 5-325 MG PO TABS
1.0000 | ORAL_TABLET | ORAL | Status: DC | PRN
  Administered 2020-11-10 – 2020-11-14 (×8): 1 via ORAL
  Filled 2020-11-10 (×8): qty 1

## 2020-11-10 MED ORDER — CARVEDILOL 12.5 MG PO TABS
12.5000 mg | ORAL_TABLET | Freq: Two times a day (BID) | ORAL | Status: DC
  Administered 2020-11-10 – 2020-11-14 (×8): 12.5 mg via ORAL
  Filled 2020-11-10 (×8): qty 1

## 2020-11-10 MED ORDER — HEPARIN SODIUM (PORCINE) 5000 UNIT/ML IJ SOLN
5000.0000 [IU] | Freq: Three times a day (TID) | INTRAMUSCULAR | Status: DC
  Administered 2020-11-10 – 2020-11-14 (×11): 5000 [IU] via SUBCUTANEOUS
  Filled 2020-11-10 (×11): qty 1

## 2020-11-10 MED ORDER — FLUTICASONE FUROATE-VILANTEROL 100-25 MCG/INH IN AEPB
1.0000 | INHALATION_SPRAY | Freq: Every day | RESPIRATORY_TRACT | Status: DC
  Filled 2020-11-10: qty 28

## 2020-11-10 MED ORDER — ACETAMINOPHEN 325 MG PO TABS
650.0000 mg | ORAL_TABLET | Freq: Four times a day (QID) | ORAL | Status: DC | PRN
  Administered 2020-11-10 – 2020-11-13 (×3): 650 mg via ORAL
  Filled 2020-11-10 (×4): qty 2

## 2020-11-10 NOTE — ED Triage Notes (Addendum)
Pt presents with c/o fatigue since last Thursday. Pt reports that she was seen last week and was treated for a possible infection. Pt reports she has been coughing up blood and pus so she is concerned that she has strep throat. Pt does not not wear oxygen at home but has been wearing it since she became sick. Pt arrives around 86% on RA as she ran out of her home oxygen prior to arrival. Pt was tested for covid last week when she became sick and was negative.

## 2020-11-10 NOTE — ED Provider Notes (Signed)
[facility] EMERGENCY DEPARTMENT Provider Note  CSN: [id number] Arrival date & time: 11/10/20 1024    History Chief Complaint  Patient presents with  . Fatigue    HPI  Tammy Ball is a 79 y.o. female with history of [place] lung cancer s/p chemo, R upper lobectomy and radiation who has been under observation for about 4-5 years. She has also had prior RCC s/p nephrectomy. She has been feeling poorly for about a week, with fever, cough, sore throat, abdominal discomfort and diarrhea. She was seen in the ED on 12/25 for same, workup then showed a leukocytosis, possible lung consolidation vs mass but neg Covid and neg CT abd/pel. She was feeling better and ultimately discharged with Rx for doxycycline which she has been taking as prescribed. She has continued to have low grade fevers, cough and SOB. She has had increased oxygen requirements at home (she has home oxygen for COPD but hasn't needed it at all in about a year, now wearing it frequently for DOE). She also reports she has been coughing up 'pus and blood' that she thinks is from her throat. She has had exposure to Strep recently.    Past Medical History:  Diagnosis Date  . Anemia    was while doing chemo  . Anxiety   . Asthma   . Chemotherapy-induced neuropathy ([place]) 11/01/2015  . Claustrophobia   . COPD (chronic obstructive pulmonary disease) ([place])   . Depression   . Encounter for antineoplastic chemotherapy 02/09/2016  . Glaucoma   . Headache    prior to menopause  . Heart murmur   . History of echocardiogram    Echo 2/17: EF 89-21%, grade 1 diastolic dysfunction, mild MR, trivial pericardial effusion  . History of nuclear stress test    Myoview 2/17: no ischemia or scar, EF 79%; low risk  . History of radiation therapy 10/30/16-11/06/16  . Hyperlipidemia   . Hypertension   . Insomnia 05/16/2016  . Non-small cell carcinoma of right lung, stage 2 ([place]) 10/03/2015  . Radiation 02/29/16-04/10/16   50.4 Gy to right  central chest  . Renal cell carcinoma ([place])    L nephrectomy  in 2012    Past Surgical History:  Procedure Laterality Date  . BACK SURGERY     cervical 1991  . EYE SURGERY    . IR GENERIC HISTORICAL  01/16/2017   IR RADIOLOGIST EVAL & MGMT 01/16/2017 [facility]  . IR GENERIC HISTORICAL  01/31/2017   IR FLUORO GUIDED NEEDLE PLC ASPIRATION/INJECTION LOC 01/31/2017 Luanne Bras, MD [facility]  . IR GENERIC HISTORICAL  01/31/2017   IR VERTEBROPLASTY CERV/THOR BX INC UNI/BIL INC/INJECT/IMAGING 01/31/2017 Luanne Bras, MD [facility]  . IR GENERIC HISTORICAL  01/31/2017   IR VERTEBROPLASTY EA ADDL (T&LS) BX INC UNI/BIL INC INJECT/IMAGING 01/31/2017 Luanne Bras, MD [facility]  . kidney cancer    . NEPHRECTOMY    . SPINE SURGERY    . TUBAL LIGATION    . VIDEO ASSISTED THORACOSCOPY (VATS)/WEDGE RESECTION Right 01/18/2016   Procedure: VIDEO ASSISTED THORACOSCOPY (VATS)/LUNG RESECTION, THOROCOTOMY, RIGHT UPPER LOBECTOMY, LYMPH NODE DISSECTION, PLACEMENT OF ON Q;  Surgeon: [name], MD;  Location: [facility];  Service: Thoracic;  Laterality: Right;  Marland Kitchen VIDEO BRONCHOSCOPY Bilateral 09/20/2015   Procedure: VIDEO BRONCHOSCOPY WITHOUT FLUORO;  Surgeon: Rigoberto Noel, MD;  Location: [facility];  Service: Cardiopulmonary;  Laterality: Bilateral;  . VIDEO BRONCHOSCOPY N/A 01/18/2016   Procedure: VIDEO BRONCHOSCOPY;  Surgeon: [name], MD;  Location: [facility];  Service: Thoracic;  Laterality: N/A;    Family History  Problem Relation Age of Onset  . Heart disease Sister   . Obesity Brother   . Heart attack Daughter 37       s/p CABG  . Glaucoma Daughter   . Breast cancer Sister   . Birth defects Sister   . Cancer Mother        Bladder Cancer  . Hypertension Mother   . CAD Mother 26  . Cancer Maternal Grandmother     Social History   Tobacco Use  . Smoking status: Former Smoker    Packs/day: 1.00    Years: 50.00    Pack years: 50.00    Types: Cigarettes    Quit  date: 03/16/2011    Years since quitting: 9.6  . Smokeless tobacco: Never Used  . Tobacco comment: last 4-5 years of smoking, smoked 0.5 pack/day   Vaping Use  . Vaping Use: Never used  Substance Use Topics  . Alcohol use: No    Alcohol/week: 0.0 standard drinks  . Drug use: No     Home Medications Prior to Admission medications   Medication Sig Start Date End Date Taking? Authorizing Provider  acetaminophen (TYLENOL) 500 MG tablet Take 500 mg by mouth every 6 (six) hours as needed for mild pain, moderate pain, fever or headache. Reported on 05/24/2016   Yes [provider]  albuterol (PROVENTIL) (2.5 MG/3ML) 0.083% nebulizer solution Take 3 mLs (2.5 mg total) by nebulization every 6 (six) hours as needed for wheezing or shortness of breath. 12/30/18  Yes Rai, Ripudeep K, MD  albuterol (VENTOLIN HFA) 108 (90 Base) MCG/ACT inhaler Inhale 1-2 puffs into the lungs every 6 (six) hours as needed for wheezing or shortness of breath. 02/13/19  Yes Forrest Moron, MD  Ascorbic Acid (VITAMIN C) 1000 MG tablet Take 1,000 mg by mouth daily.   Yes [provider]  aspirin EC 81 MG tablet Take 81 mg by mouth daily.   Yes [provider]  atorvastatin (LIPITOR) 40 MG tablet TAKE 1 TABLET BY MOUTH EVERY DAY Patient taking differently: Take 40 mg by mouth daily. 01/18/20  Yes Forrest Moron, MD  calcium carbonate (TUMS EX) 750 MG chewable tablet Chew 1 tablet by mouth daily as needed for heartburn.   Yes [provider]  Carboxymethylcellulose Sodium (ARTIFICIAL TEARS OP) Place 1 drop into both eyes daily as needed (dry eyes).   Yes [provider]  carvedilol (COREG) 12.5 MG tablet Take 12.5 mg by mouth 2 (two) times daily. 10/12/20  Yes [provider]  Cholecalciferol (VITAMIN D3 PO) Take 1,200 mg by mouth daily.   Yes [provider]  doxycycline (VIBRAMYCIN) 100 MG capsule Take 1 capsule (100 mg total) by mouth 2 (two) times daily for 10 days.  11/05/20 11/15/20 Yes Curatolo, Adam, DO  fish oil-omega-3 fatty acids 1000 MG capsule Take 1 capsule by mouth daily.   Yes [provider]  Guaifenesin (MUCINEX MAXIMUM STRENGTH) 1200 MG TB12 Take 1,200 mg by mouth 2 (two) times daily.   Yes [provider]  losartan (COZAAR) 100 MG tablet Take 100 mg by mouth daily. 09/09/20  Yes [provider]  ondansetron (ZOFRAN) 4 MG tablet Take 1 tablet (4 mg total) by mouth every 6 (six) hours. Patient taking differently: Take 4 mg by mouth every 8 (eight) hours as needed for nausea or vomiting. 11/05/20  Yes Curatolo, Adam, DO  OXYGEN  Inhale 2 L into the lungs continuous.   Yes [provider]  sodium chloride (OCEAN) 0.65 % SOLN nasal spray Place 1 spray into both nostrils daily.   Yes [provider]  traMADol (ULTRAM) 50 MG tablet Take 1 tablet (50 mg total) by mouth every 6 (six) hours as needed (BACK PAIN). 06/01/19  Yes Stallings, Zoe A, MD  TRELEGY ELLIPTA 100-62.5-25 MCG/INH AEPB INHALE 1 PUFF BY MOUTH EVERY DAY Patient taking differently: Inhale 1 puff into the lungs daily. 04/21/20  Yes Collene Gobble, MD  clonazePAM (KLONOPIN) 0.5 MG tablet Take 0.5 tablets (0.25 mg total) by mouth daily as needed for anxiety. Patient not taking: Reported on 11/10/2020 12/07/19   Forrest Moron, MD  losartan (COZAAR) 50 MG tablet Take 1 tablet (50 mg total) by mouth daily. Patient not taking: No sig reported 03/07/20   Delia Chimes A, MD  tiZANidine (ZANAFLEX) 2 MG tablet TAKE 1 TABLET BY MOUTH 3 TIMES A DAY AS NEEDED FOR SPASMS Patient not taking: No sig reported 08/12/19   Forrest Moron, MD  traZODone (DESYREL) 50 MG tablet TAKE 1 TO 2 TABLETS BY MOUTH AT BEDTIME AS NEEDED FOR SLEEP Patient not taking: No sig reported 08/26/19   Forrest Moron, MD     Allergies    Bee venom, Amlodipine, Levofloxacin, and Hctz [hydrochlorothiazide]   Review of Systems   Review of Systems A comprehensive review of  systems was completed and negative except as noted in HPI.    Physical Exam BP (!) 161/92   Pulse 88   Temp 98.4 F (36.9 C) (Oral)   Resp (!) 24   Ht 5' 3.5" (1.613 m)   Wt 83.9 kg   SpO2 100%   BMI 32.26 kg/m   Physical Exam Vitals and nursing note reviewed.  Constitutional:      Appearance: Normal appearance.  HENT:     Head: Normocephalic and atraumatic.     Nose: Nose normal.     Mouth/Throat:     Mouth: Mucous membranes are moist.     Pharynx: No oropharyngeal exudate or posterior oropharyngeal erythema.  Eyes:     Extraocular Movements: Extraocular movements intact.     Conjunctiva/sclera: Conjunctivae normal.  Cardiovascular:     Rate and Rhythm: Normal rate.  Pulmonary:     Effort: Pulmonary effort is normal.     Breath sounds: Normal breath sounds. No wheezing or rhonchi.  Abdominal:     General: Abdomen is flat.     Palpations: Abdomen is soft.     Tenderness: There is no abdominal tenderness.  Musculoskeletal:        General: No swelling. Normal range of motion.     Cervical back: Neck supple.  Skin:    General: Skin is warm and dry.  Neurological:     General: No focal deficit present.     Mental Status: She is alert.  Psychiatric:        Mood and Affect: Mood normal.      ED Results / Procedures / Treatments   Labs (all labs ordered are listed, but only abnormal results are displayed) Labs Reviewed  COMPREHENSIVE METABOLIC PANEL - Abnormal; Notable for the following components:      Result Value   Sodium 132 (*)    Chloride 94 (*)    Glucose, Bld 101 (*)    BUN 27 (*)    Creatinine, Ser 1.44 (*)    Albumin 3.4 (*)  GFR, Estimated 37 (*)    All other components within normal limits  CBC WITH DIFFERENTIAL/PLATELET - Abnormal; Notable for the following components:   RDW 15.9 (*)    All other components within normal limits  RESP PANEL BY RT-PCR (FLU A&B, COVID) [id number]  GROUP A STREP BY PCR  CULTURE, BLOOD (ROUTINE X 2)  CULTURE,  BLOOD (ROUTINE X 2)  LACTIC ACID, PLASMA    EKG None  Radiology CT Chest W Contrast  Result Date: 11/10/2020 CLINICAL DATA:  Non-small cell lung cancer. Cough with hemoptysis and shortness of breath. Reported recent negative COVID testing. EXAM: CT CHEST WITH CONTRAST TECHNIQUE: Multidetector CT imaging of the chest was performed during intravenous contrast administration. CONTRAST:  4mL OMNIPAQUE IOHEXOL 300 MG/ML  SOLN COMPARISON:  Abdominal CT and chest radiographs 11/05/2020. Chest CT 05/30/2020 and 12/01/2019 FINDINGS: Cardiovascular: Atherosclerosis of the aorta, great vessels and coronary arteries. There is central enlargement of the pulmonary arteries consistent with pulmonary arterial hypertension. The heart size is stable. No significant pericardial fluid. Mediastinum/Nodes: There are no enlarged mediastinal, hilar or axillary lymph nodes. The thyroid gland, trachea and esophagus demonstrate no significant findings. Lungs/Pleura: Moderate centrilobular and paraseptal emphysema with stable postsurgical changes from previous right upper lobe resection. There are stable paramediastinal radiation changes on the right. Compared with the recent abdominal CT, there are new trace and small left pleural effusions. There is associated increased compressive atelectasis and opacity within the left lower lobe obscuring the previously demonstrated nodular density. In addition, there are patchy airspace opacities with air bronchograms throughout the lingula, likely reflecting pneumonia. These were seen on recent chest radiographs. There is a somewhat nodular component measuring up to 2.8 x 2.2 cm on image 62/7, containing an air bronchogram. No suspicious right lung nodules. Upper abdomen: The visualized upper abdomen appears stable without significant findings post left nephrectomy. Musculoskeletal/Chest wall: There is no chest wall mass or suspicious osseous finding. Previous C6-7 ACDF. Stable compression  deformities at T3, T4 and T6 with post spinal augmentation changes at T3 and T4. IMPRESSION: 1. New patchy airspace opacities with air bronchograms throughout the lingula and left lower lobe, likely reflecting pneumonia. Radiographic follow-up necessary to document resolution. 2. Compared with recent abdominal CT, new left-greater-than-right pleural effusions with associated partial left lower lobe collapse, obscuring a previously demonstrated nodular left lower lobe density. 3. Stable paramediastinal radiation changes on the right. No evidence of local recurrence or metastatic disease. 4. Central enlargement of the pulmonary arteries consistent with pulmonary arterial hypertension. 5. Aortic Atherosclerosis (ICD10-I70.0) and Emphysema (ICD10-J43.9). Electronically Signed   By: Richardean Sale M.D.   On: 11/10/2020 14:54    Procedures Procedures  Medications Ordered in the ED Medications  cefTRIAXone (ROCEPHIN) 1 g in sodium chloride 0.9 % 100 mL IVPB (has no administration in time range)  azithromycin (ZITHROMAX) 500 mg in sodium chloride 0.9 % 250 mL IVPB (has no administration in time range)  acetaminophen (TYLENOL) tablet 650 mg (650 mg Oral Given 11/10/20 1410)  iohexol (OMNIPAQUE) 300 MG/ML solution 75 mL (60 mLs Intravenous Contrast Given 11/10/20 1417)     MDM Rules/Calculators/A&P MDM Patient with recent visit for possible viral illness vs PNA. Has been taking doxycycline without much improvement. Will recheck labs including Covid/Flu and Strep. She is due for a surveillance CT in a few weeks so will go ahead and do that now as well.  ED Course  I have reviewed the triage vital signs and the nursing notes.  Pertinent labs &  imaging results that were available during my care of the patient were reviewed by me and considered in my medical decision making (see chart for details).  Clinical Course as of 11/10/20 1522  Thu Nov 10, 2020  1301 CBC shows improved leukocytosis.  [CS]  2518  CMP with improved creatinine from previous visit.  [CS]  1323 Strep is negative.  [CS]  1330 Covid/Flu remains negative.  [CS]  9842 CT images and results reviewed, worsening infiltrates and pleural effusion. Has been taking doxycycline but worsening respiratory status and hypoxia on room air. She had a similar presentation in 2020 requiring readmission. She is allergic to Levaquin. She is amenable to admission for IV antibiotics. Hospitalist paged.  [CS]  1031 Spoke with Dr. [name], Hospitalist, who will evaluate for admission.  [CS]    Clinical Course User Index [CS] Truddie Hidden, MD    Final Clinical Impression(s) / ED Diagnoses Final diagnoses:  Community acquired pneumonia, unspecified laterality    Rx / DC Orders ED Discharge Orders    None       Truddie Hidden, MD 11/10/20 [phone]

## 2020-11-10 NOTE — ED Notes (Signed)
ED TO INPATIENT HANDOFF REPORT  Name/Age/Gender Tammy Ball 79 y.o. female  Code Status Code Status History    Date Active Date Inactive Code Status Order ID Comments User Context   01/07/2019 1521 01/11/2019 1701 Full Code [id number]  Lavina Hamman, MD ED   12/25/2018 1558 12/30/2018 1438 Full Code [id number]  Elodia Florence., MD Inpatient   01/23/2018 1818 01/25/2018 1724 Full Code [id number]  Roxan Hockey, MD ED   09/19/2015 1447 09/21/2015 1331 Full Code [id number]  Domenic Polite, MD Inpatient   09/08/2015 1419 09/13/2015 1747 Full Code [id number]  Hosie Poisson, MD Inpatient   Advance Care Planning Activity    Questions for Most Recent Historical Code Status (Order [id number])       Home/SNF/Other Home  Chief Complaint CAP (community acquired pneumonia) [J18.9]  Level of Care/Admitting Diagnosis ED Disposition    ED Disposition Condition [place]: [facility] [100102]  Level of Care: Med-Surg [16]  May admit patient to [facility] or [name] if equivalent level of care is available:: Yes  Covid Evaluation: Confirmed COVID Negative  Diagnosis: CAP (community acquired pneumonia) [742595]  Admitting Physician: Harold Hedge [[id number]]  Attending Physician: Harold Hedge [[id number]]  Estimated length of stay: past midnight tomorrow  Certification:: I certify this patient will need inpatient services for at least 2 midnights       Medical History Past Medical History:  Diagnosis Date  . Anemia    was while doing chemo  . Anxiety   . Asthma   . Chemotherapy-induced neuropathy ([place]) 11/01/2015  . Claustrophobia   . COPD (chronic obstructive pulmonary disease) ([place])   . Depression   . Encounter for antineoplastic chemotherapy 02/09/2016  . Glaucoma   . Headache    prior to menopause  . Heart murmur   . History of echocardiogram    Echo 2/17: EF 84-16%, grade 1 diastolic dysfunction, mild MR, trivial pericardial  effusion  . History of nuclear stress test    Myoview 2/17: no ischemia or scar, EF 79%; low risk  . History of radiation therapy 10/30/16-11/06/16  . Hyperlipidemia   . Hypertension   . Insomnia 05/16/2016  . Non-small cell carcinoma of right lung, stage 2 ([place]) 10/03/2015  . Radiation 02/29/16-04/10/16   50.4 Gy to right central chest  . Renal cell carcinoma (H[facility])    L nephrectomy  in 2012    Allergies Allergies  Allergen Reactions  . Bee Venom Anaphylaxis, Shortness Of Breath, Swelling and Other (See Comments)    Swelling at site   . Amlodipine Swelling and Other (See Comments)    Swelling of the ankles and hands   . Levofloxacin Other (See Comments)    Joint pain   . Hctz [Hydrochlorothiazide] Palpitations and Other (See Comments)    Sweating     IV Location/Drains/Wounds Patient Lines/Drains/Airways Status    Active Line/Drains/Airways    Name Placement date Placement time Site Days   Peripheral IV 11/10/20 Right Antecubital 11/10/20  1219  Antecubital  less than 1          Labs/Imaging Results for orders placed or performed during the hospital encounter of 11/10/20 (from the past 48 hour(s))  Comprehensive metabolic panel     Status: Abnormal   Collection Time: 11/10/20 12:21 PM  Result Value Ref Range   Sodium 132 (L) 135 - 145 mmol/L   Potassium 4.5 3.5 - 5.1 mmol/L   Chloride 94 (L)  98 - 111 mmol/L   CO2 27 22 - 32 mmol/L   Glucose, Bld 101 (H) 70 - 99 mg/dL    Comment: Glucose reference range applies only to samples taken after fasting for at least 8 hours.   BUN 27 (H) 8 - 23 mg/dL   Creatinine, Ser 1.44 (H) 0.44 - 1.00 mg/dL   Calcium 9.9 8.9 - 10.3 mg/dL   Total Protein 7.2 6.5 - 8.1 g/dL   Albumin 3.4 (L) 3.5 - 5.0 g/dL   AST 25 15 - 41 U/L   ALT 23 0 - 44 U/L   Alkaline Phosphatase 88 38 - 126 U/L   Total Bilirubin 0.7 0.3 - 1.2 mg/dL   GFR, Estimated 37 (L) >60 mL/min    Comment: (NOTE) Calculated using the CKD-EPI Creatinine Equation (2021)     Anion gap 11 5 - 15    Comment: Performed at [facility], [place] [address], [place], [place] [address]  CBC with Differential     Status: Abnormal   Collection Time: 11/10/20 12:21 PM  Result Value Ref Range   WBC 5.3 4.0 - 10.5 K/uL   RBC 4.18 3.87 - 5.11 MIL/uL   Hemoglobin 12.5 12.0 - 15.0 g/dL   HCT 38.7 36.0 - 46.0 %   MCV 92.6 80.0 - 100.0 fL   MCH 29.9 26.0 - 34.0 pg   MCHC 32.3 30.0 - 36.0 g/dL   RDW 15.9 (H) 11.5 - 15.5 %   Platelets 344 150 - 400 K/uL   nRBC 0.0 0.0 - 0.2 %   Neutrophils Relative % 70 %   Neutro Abs 3.7 1.7 - 7.7 K/uL   Lymphocytes Relative 18 %   Lymphs Abs 1.0 0.7 - 4.0 K/uL   Monocytes Relative 9 %   Monocytes Absolute 0.5 0.1 - 1.0 K/uL   Eosinophils Relative 1 %   Eosinophils Absolute 0.0 0.0 - 0.5 K/uL   Basophils Relative 1 %   Basophils Absolute 0.1 0.0 - 0.1 K/uL   Immature Granulocytes 1 %   Abs Immature Granulocytes 0.05 0.00 - 0.07 K/uL    Comment: Performed at [facility], [place] [address], [place], [place] [address]  Lactic acid, plasma     Status: None   Collection Time: 11/10/20 12:21 PM  Result Value Ref Range   Lactic Acid, Venous 0.8 0.5 - 1.9 mmol/L    Comment: Performed at [facility], [place] [address], [place], [place] [address]  Resp Panel by RT-PCR (Flu A&B, Covid) Nasopharyngeal Swab     Status: None   Collection Time: 11/10/20 12:32 PM   Specimen: Nasopharyngeal Swab; Nasopharyngeal(NP) swabs in vial transport medium  Result Value Ref Range   SARS Coronavirus 2 by RT PCR NEGATIVE NEGATIVE    Comment: (NOTE) SARS-CoV-2 target nucleic acids are NOT DETECTED.  The SARS-CoV-2 RNA is generally detectable in upper respiratory specimens during the acute phase of infection. The lowest concentration of SARS-CoV-2 viral copies this assay can detect is 138 copies/mL. A negative result does not preclude SARS-Cov-2 infection and should not be used as the sole basis for treatment  or other patient management decisions. A negative result may occur with  improper specimen collection/handling, submission of specimen other than nasopharyngeal swab, presence of viral mutation(s) within the areas targeted by this assay, and inadequate number of viral copies(<138 copies/mL). A negative result must be combined with clinical observations, patient history, and epidemiological information. The expected result is Negative.  Fact Sheet for Patients:  EntrepreneurPulse.com.au  Fact Sheet for Healthcare Providers:  IncredibleEmployment.be  This test is no t yet approved or cleared by the [place] FDA and  has been authorized for detection and/or diagnosis of SARS-CoV-2 by FDA under an Emergency Use Authorization (EUA). This EUA will remain  in effect (meaning this test can be used) for the duration of the COVID-19 declaration under Section 564(b)(1) of the Act, 21 U.S.C.section 360bbb-3(b)(1), unless the authorization is terminated  or revoked sooner.       Influenza A by PCR NEGATIVE NEGATIVE   Influenza B by PCR NEGATIVE NEGATIVE    Comment: (NOTE) The Xpert Xpress SARS-CoV-2/FLU/RSV plus assay is intended as an aid in the diagnosis of influenza from Nasopharyngeal swab specimens and should not be used as a sole basis for treatment. Nasal washings and aspirates are unacceptable for Xpert Xpress SARS-CoV-2/FLU/RSV testing.  Fact Sheet for Patients: EntrepreneurPulse.com.au  Fact Sheet for Healthcare Providers: IncredibleEmployment.be  This test is not yet approved or cleared by the [place] FDA and has been authorized for detection and/or diagnosis of SARS-CoV-2 by FDA under an Emergency Use Authorization (EUA). This EUA will remain in effect (meaning this test can be used) for the duration of the COVID-19 declaration under Section 564(b)(1) of the Act, 21 U.S.C. section  360bbb-3(b)(1), unless the authorization is terminated or revoked.  Performed at [facility], [place] [address], [place], [place] [address]   Group A Strep by PCR     Status: None   Collection Time: 11/10/20 12:32 PM   Specimen: Throat; Sterile Swab  Result Value Ref Range   Group A Strep by PCR NOT DETECTED NOT DETECTED    Comment: Performed at [facility], [place] [address] [place], [place] [address]   CT Chest W Contrast  Result Date: 11/10/2020 CLINICAL DATA:  Non-small cell lung cancer. Cough with hemoptysis and shortness of breath. Reported recent negative COVID testing. EXAM: CT CHEST WITH CONTRAST TECHNIQUE: Multidetector CT imaging of the chest was performed during intravenous contrast administration. CONTRAST:  37mL OMNIPAQUE IOHEXOL 300 MG/ML  SOLN COMPARISON:  Abdominal CT and chest radiographs 11/05/2020. Chest CT 05/30/2020 and 12/01/2019 FINDINGS: Cardiovascular: Atherosclerosis of the aorta, great vessels and coronary arteries. There is central enlargement of the pulmonary arteries consistent with pulmonary arterial hypertension. The heart size is stable. No significant pericardial fluid. Mediastinum/Nodes: There are no enlarged mediastinal, hilar or axillary lymph nodes. The thyroid gland, trachea and esophagus demonstrate no significant findings. Lungs/Pleura: Moderate centrilobular and paraseptal emphysema with stable postsurgical changes from previous right upper lobe resection. There are stable paramediastinal radiation changes on the right. Compared with the recent abdominal CT, there are new trace and small left pleural effusions. There is associated increased compressive atelectasis and opacity within the left lower lobe obscuring the previously demonstrated nodular density. In addition, there are patchy airspace opacities with air bronchograms throughout the lingula, likely reflecting pneumonia. These were seen on recent chest radiographs.  There is a somewhat nodular component measuring up to 2.8 x 2.2 cm on image 62/7, containing an air bronchogram. No suspicious right lung nodules. Upper abdomen: The visualized upper abdomen appears stable without significant findings post left nephrectomy. Musculoskeletal/Chest wall: There is no chest wall mass or suspicious osseous finding. Previous C6-7 ACDF. Stable compression deformities at T3, T4 and T6 with post spinal augmentation changes at T3 and T4. IMPRESSION: 1. New patchy airspace opacities with air bronchograms throughout the lingula and left lower  lobe, likely reflecting pneumonia. Radiographic follow-up necessary to document resolution. 2. Compared with recent abdominal CT, new left-greater-than-right pleural effusions with associated partial left lower lobe collapse, obscuring a previously demonstrated nodular left lower lobe density. 3. Stable paramediastinal radiation changes on the right. No evidence of local recurrence or metastatic disease. 4. Central enlargement of the pulmonary arteries consistent with pulmonary arterial hypertension. 5. Aortic Atherosclerosis (ICD10-I70.0) and Emphysema (ICD10-J43.9). Electronically Signed   By: Richardean Sale M.D.   On: 11/10/2020 14:54    Pending Labs Unresulted Labs (From admission, onward)          Start     Ordered   11/10/20 1146  Culture, blood (routine x 2)  BLOOD CULTURE X 2,   STAT      11/10/20 1146          Vitals/Pain Today's Vitals   11/10/20 1525 11/10/20 1600 11/10/20 1630 11/10/20 1658  BP:  (!) 145/82 (!) 162/89   Pulse:  83 86 82  Resp:  (!) 25 18 19   Temp:      TempSrc:      SpO2:  98% 98% 99%  Weight:      Height:      PainSc: 3        Isolation Precautions No active isolations  Medications Medications  azithromycin (ZITHROMAX) 500 mg in sodium chloride 0.9 % 250 mL IVPB (500 mg Intravenous New Bag/Given 11/10/20 1618)  acetaminophen (TYLENOL) tablet 650 mg (650 mg Oral Given 11/10/20 1410)  iohexol  (OMNIPAQUE) 300 MG/ML solution 75 mL (60 mLs Intravenous Contrast Given 11/10/20 1417)  cefTRIAXone (ROCEPHIN) 1 g in sodium chloride 0.9 % 100 mL IVPB (0 g Intravenous Stopped 11/10/20 1557)    Mobility walks

## 2020-11-10 NOTE — ED Notes (Signed)
Attempted to call report to RN on 3W and she asked to call her back in 10 minutes

## 2020-11-10 NOTE — Plan of Care (Signed)

## 2020-11-10 NOTE — Progress Notes (Signed)
Pt stable on arrival to floor. Assessment and vs performed. Pt did report nausea and pain. Rn will medicate for these needs. Family and pt updated on plan of care. Rn will continue to monitor.

## 2020-11-10 NOTE — Plan of Care (Signed)
  Problem: Clinical Measurements: Goal: Diagnostic test results will improve Outcome: Progressing   Problem: Clinical Measurements: Goal: Respiratory complications will improve Outcome: Progressing   Problem: Clinical Measurements: Goal: Cardiovascular complication will be avoided Outcome: Progressing   Problem: Nutrition: Goal: Adequate nutrition will be maintained Outcome: Progressing   Problem: Coping: Goal: Level of anxiety will decrease Outcome: Progressing   Problem: Pain Managment: Goal: General experience of comfort will improve Outcome: Progressing   Problem: Safety: Goal: Ability to remain free from injury will improve Outcome: Progressing

## 2020-11-10 NOTE — H&P (Signed)
History and Physical        Hospital Admission Note Date: 11/10/2020  [name] Medical record number: [id number] Date of birth: Aug 31, 1941 Age: 79 y.o. Gender: female  PCP: Forrest Moron, MD    Chief Complaint    Chief Complaint  Patient presents with  . Fatigue      HPI:   This is a 79 year old female with past medical history of COPD, HFpEF, hypertension, hyperlipidemia, non-small cell carcinoma of the right lung s/p right upper lobectomy, radiation and chemo, RCC s/p left nephrectomy who presented to the ED with flulike symptoms x1 week.  She has noted fever up to 102 F, purulent cough with some scant hemoptysis, sore throat, diarrhea and abdominal discomfort for that time.  She was initially seen in the ED on 12/25 for the same at which point work-up showed leukocytosis and possible lung consolidation versus mass but negative Covid test and negative CT abdomen pelvis and she was discharged with doxycycline as she requested to go home and spend time with family for Christmas.  She has been taking doxycycline as prescribed without improvement.  Has had increased O2 requirements at home (has home O2 for COPD but has not needed for the past year).     ED Course: Afebrile, hypertensive, Placed on nasal cannula.  notable Labs: Sodium 132, K4.5, BUN 27, creatinine 1.44, WBC 5.3, Hb 12.5, lactic acid 0.8, COVID-19 and flu negative. Notable Imaging: CT chest with contrast-concern for left lower lobe pneumonia and new left greater than right pleural effusions with partial left lower lobe collapse, pulmonary arterial hypertension, aortic atherosclerosis and emphysema. Patient received Ceftriaxone and azithromycin and Tylenol   Vitals:   11/10/20 1630 11/10/20 1658  BP: (!) 162/89   Pulse: 86 82  Resp: 18 19  Temp:    SpO2: 98% 99%     Review of Systems:   Review of Systems  All other systems reviewed and are negative.   Medical/Social/Family History   Past Medical History: Past Medical History:  Diagnosis Date  . Anemia    was while doing chemo  . Anxiety   . Asthma   . Chemotherapy-induced neuropathy ([place]) 11/01/2015  . Claustrophobia   . COPD (chronic obstructive pulmonary disease) ([place])   . Depression   . Encounter for antineoplastic chemotherapy 02/09/2016  . Glaucoma   . Headache    prior to menopause  . Heart murmur   . History of echocardiogram    Echo 2/17: EF 35-46%, grade 1 diastolic dysfunction, mild MR, trivial pericardial effusion  . History of nuclear stress test    Myoview 2/17: no ischemia or scar, EF 79%; low risk  . History of radiation therapy 10/30/16-11/06/16  . Hyperlipidemia   . Hypertension   . Insomnia 05/16/2016  . Non-small cell carcinoma of right lung, stage 2 ([place]) 10/03/2015  . Radiation 02/29/16-04/10/16   50.4 Gy to right central chest  . Renal cell carcinoma ([place])    L nephrectomy  in 2012    Past Surgical History:  Procedure Laterality Date  . BACK SURGERY     cervical 1991  . EYE SURGERY    . IR GENERIC HISTORICAL  01/16/2017  IR RADIOLOGIST EVAL & MGMT 01/16/2017 [facility]  . IR GENERIC HISTORICAL  01/31/2017   IR FLUORO GUIDED NEEDLE PLC ASPIRATION/INJECTION LOC 01/31/2017 Luanne Bras, MD [facility]  . IR GENERIC HISTORICAL  01/31/2017   IR VERTEBROPLASTY CERV/THOR BX INC UNI/BIL INC/INJECT/IMAGING 01/31/2017 Luanne Bras, MD [facility]  . IR GENERIC HISTORICAL  01/31/2017   IR VERTEBROPLASTY EA ADDL (T&LS) BX INC UNI/BIL INC INJECT/IMAGING 01/31/2017 Luanne Bras, MD [facility]  . kidney cancer    . NEPHRECTOMY    . SPINE SURGERY    . TUBAL LIGATION    . VIDEO ASSISTED THORACOSCOPY (VATS)/WEDGE RESECTION Right 01/18/2016   Procedure: VIDEO ASSISTED THORACOSCOPY (VATS)/LUNG RESECTION, THOROCOTOMY, RIGHT UPPER LOBECTOMY, LYMPH NODE DISSECTION, PLACEMENT OF ON Q;   Surgeon: [name], MD;  Location: [facility];  Service: Thoracic;  Laterality: Right;  Marland Kitchen VIDEO BRONCHOSCOPY Bilateral 09/20/2015   Procedure: VIDEO BRONCHOSCOPY WITHOUT FLUORO;  Surgeon: Rigoberto Noel, MD;  Location: [facility];  Service: Cardiopulmonary;  Laterality: Bilateral;  . VIDEO BRONCHOSCOPY N/A 01/18/2016   Procedure: VIDEO BRONCHOSCOPY;  Surgeon: [name], MD;  Location: [facility] OR;  Service: Thoracic;  Laterality: N/A;    Medications: Prior to Admission medications   Medication Sig Start Date End Date Taking? Authorizing Provider  acetaminophen (TYLENOL) 500 MG tablet Take 500 mg by mouth every 6 (six) hours as needed for mild pain, moderate pain, fever or headache. Reported on 05/24/2016   Yes [provider]  albuterol (PROVENTIL) (2.5 MG/3ML) 0.083% nebulizer solution Take 3 mLs (2.5 mg total) by nebulization every 6 (six) hours as needed for wheezing or shortness of breath. 12/30/18  Yes Rai, Ripudeep K, MD  albuterol (VENTOLIN HFA) 108 (90 Base) MCG/ACT inhaler Inhale 1-2 puffs into the lungs every 6 (six) hours as needed for wheezing or shortness of breath. 02/13/19  Yes Forrest Moron, MD  Ascorbic Acid (VITAMIN C) 1000 MG tablet Take 1,000 mg by mouth daily.   Yes [provider]  aspirin EC 81 MG tablet Take 81 mg by mouth daily.   Yes [provider]  atorvastatin (LIPITOR) 40 MG tablet TAKE 1 TABLET BY MOUTH EVERY DAY Patient taking differently: Take 40 mg by mouth daily. 01/18/20  Yes Forrest Moron, MD  calcium carbonate (TUMS EX) 750 MG chewable tablet Chew 1 tablet by mouth daily as needed for heartburn.   Yes [provider]  Carboxymethylcellulose Sodium (ARTIFICIAL TEARS OP) Place 1 drop into both eyes daily as needed (dry eyes).   Yes [provider]  carvedilol (COREG) 12.5 MG tablet Take 12.5 mg by mouth 2 (two) times daily. 10/12/20  Yes [provider]  Cholecalciferol (VITAMIN D3 PO) Take 1,200 mg by  mouth daily.   Yes [provider]  doxycycline (VIBRAMYCIN) 100 MG capsule Take 1 capsule (100 mg total) by mouth 2 (two) times daily for 10 days. 11/05/20 11/15/20 Yes Curatolo, Adam, DO  fish oil-omega-3 fatty acids 1000 MG capsule Take 1 capsule by mouth daily.   Yes [provider]  Guaifenesin (MUCINEX MAXIMUM STRENGTH) 1200 MG TB12 Take 1,200 mg by mouth 2 (two) times daily.   Yes [provider]  losartan (COZAAR) 100 MG tablet Take 100 mg by mouth daily. 09/09/20  Yes [provider]  ondansetron (ZOFRAN) 4 MG tablet Take 1 tablet (4 mg total) by mouth every 6 (six) hours. Patient taking differently: Take 4 mg by mouth every 8 (eight) hours as needed for nausea or  vomiting. 11/05/20  Yes Curatolo, Adam, DO  OXYGEN Inhale 2 L into the lungs continuous.   Yes [provider]  sodium chloride (OCEAN) 0.65 % SOLN nasal spray Place 1 spray into both nostrils daily.   Yes [provider]  traMADol (ULTRAM) 50 MG tablet Take 1 tablet (50 mg total) by mouth every 6 (six) hours as needed (BACK PAIN). 06/01/19  Yes Stallings, Zoe A, MD  TRELEGY ELLIPTA 100-62.5-25 MCG/INH AEPB INHALE 1 PUFF BY MOUTH EVERY DAY Patient taking differently: Inhale 1 puff into the lungs daily. 04/21/20  Yes Collene Gobble, MD  clonazePAM (KLONOPIN) 0.5 MG tablet Take 0.5 tablets (0.25 mg total) by mouth daily as needed for anxiety. Patient not taking: Reported on 11/10/2020 12/07/19   Forrest Moron, MD  losartan (COZAAR) 50 MG tablet Take 1 tablet (50 mg total) by mouth daily. Patient not taking: No sig reported 03/07/20   Delia Chimes A, MD  tiZANidine (ZANAFLEX) 2 MG tablet TAKE 1 TABLET BY MOUTH 3 TIMES A DAY AS NEEDED FOR SPASMS Patient not taking: No sig reported 08/12/19   Delia Chimes A, MD  traZODone (DESYREL) 50 MG tablet TAKE 1 TO 2 TABLETS BY MOUTH AT BEDTIME AS NEEDED FOR SLEEP Patient not taking: No sig reported 08/26/19   Forrest Moron, MD     Allergies:   Allergies  Allergen Reactions  . Bee Venom Anaphylaxis, Shortness Of Breath, Swelling and Other (See Comments)    Swelling at site   . Amlodipine Swelling and Other (See Comments)    Swelling of the ankles and hands   . Levofloxacin Other (See Comments)    Joint pain   . Hctz [Hydrochlorothiazide] Palpitations and Other (See Comments)    Sweating     Social History:  reports that she quit smoking about 9 years ago. Her smoking use included cigarettes. She has a 50.00 pack-year smoking history. She has never used smokeless tobacco. She reports that she does not drink alcohol and does not use drugs.  Family History: Family History  Problem Relation Age of Onset  . Heart disease Sister   . Obesity Brother   . Heart attack Daughter 32       s/p CABG  . Glaucoma Daughter   . Breast cancer Sister   . Birth defects Sister   . Cancer Mother        Bladder Cancer  . Hypertension Mother   . CAD Mother 14  . Cancer Maternal Grandmother      Objective   Physical Exam: Blood pressure (!) 162/89, pulse 82, temperature 98.4 F (36.9 C), temperature source Oral, resp. rate 19, height 5' 3.5" (1.613 m), weight 83.9 kg, SpO2 99 %.  Physical Exam Vitals and nursing note reviewed.  Constitutional:      Appearance: Normal appearance.  HENT:     Head: Normocephalic and atraumatic.  Eyes:     Conjunctiva/sclera: Conjunctivae normal.  Cardiovascular:     Rate and Rhythm: Normal rate and regular rhythm.  Pulmonary:     Effort: Pulmonary effort is normal.     Breath sounds: Examination of the left-upper field reveals wheezing. Examination of the left-middle field reveals wheezing. Examination of the left-lower field reveals wheezing. Wheezing present.  Abdominal:     General: Abdomen is flat.     Palpations: Abdomen is soft.  Musculoskeletal:        General: No swelling or tenderness.  Skin:    Coloration: Skin is not jaundiced  or pale.  Neurological:     Mental  Status: She is alert. Mental status is at baseline.  Psychiatric:        Mood and Affect: Mood normal.        Behavior: Behavior normal.     LABS on Admission: I have personally reviewed all the labs and imaging below    Basic Metabolic Panel: Recent Labs  Lab 11/05/20 1050 11/10/20 1221  NA 131* 132*  K 4.6 4.5  CL 96* 94*  CO2 24 27  GLUCOSE 122* 101*  BUN 21 27*  CREATININE 1.79* 1.44*  CALCIUM 8.5* 9.9   Liver Function Tests: Recent Labs  Lab 11/05/20 1050 11/10/20 1221  AST 17 25  ALT 14 23  ALKPHOS 73 88  BILITOT 1.3* 0.7  PROT 6.6 7.2  ALBUMIN 3.3* 3.4*   No results for input(s): LIPASE, AMYLASE in the last 168 hours. No results for input(s): AMMONIA in the last 168 hours. CBC: Recent Labs  Lab 11/05/20 1050 11/10/20 1221  WBC 17.8* 5.3  NEUTROABS 14.5* 3.7  HGB 12.0 12.5  HCT 37.1 38.7  MCV 93.7 92.6  PLT 222 344   Cardiac Enzymes: No results for input(s): CKTOTAL, CKMB, CKMBINDEX, TROPONINI in the last 168 hours. BNP: Invalid input(s): POCBNP CBG: No results for input(s): GLUCAP in the last 168 hours.  Radiological Exams on Admission:  CT Chest W Contrast  Result Date: 11/10/2020 CLINICAL DATA:  Non-small cell lung cancer. Cough with hemoptysis and shortness of breath. Reported recent negative COVID testing. EXAM: CT CHEST WITH CONTRAST TECHNIQUE: Multidetector CT imaging of the chest was performed during intravenous contrast administration. CONTRAST:  32mL OMNIPAQUE IOHEXOL 300 MG/ML  SOLN COMPARISON:  Abdominal CT and chest radiographs 11/05/2020. Chest CT 05/30/2020 and 12/01/2019 FINDINGS: Cardiovascular: Atherosclerosis of the aorta, great vessels and coronary arteries. There is central enlargement of the pulmonary arteries consistent with pulmonary arterial hypertension. The heart size is stable. No significant pericardial fluid. Mediastinum/Nodes: There are no enlarged mediastinal, hilar or axillary lymph nodes. The thyroid gland, trachea  and esophagus demonstrate no significant findings. Lungs/Pleura: Moderate centrilobular and paraseptal emphysema with stable postsurgical changes from previous right upper lobe resection. There are stable paramediastinal radiation changes on the right. Compared with the recent abdominal CT, there are new trace and small left pleural effusions. There is associated increased compressive atelectasis and opacity within the left lower lobe obscuring the previously demonstrated nodular density. In addition, there are patchy airspace opacities with air bronchograms throughout the lingula, likely reflecting pneumonia. These were seen on recent chest radiographs. There is a somewhat nodular component measuring up to 2.8 x 2.2 cm on image 62/7, containing an air bronchogram. No suspicious right lung nodules. Upper abdomen: The visualized upper abdomen appears stable without significant findings post left nephrectomy. Musculoskeletal/Chest wall: There is no chest wall mass or suspicious osseous finding. Previous C6-7 ACDF. Stable compression deformities at T3, T4 and T6 with post spinal augmentation changes at T3 and T4. IMPRESSION: 1. New patchy airspace opacities with air bronchograms throughout the lingula and left lower lobe, likely reflecting pneumonia. Radiographic follow-up necessary to document resolution. 2. Compared with recent abdominal CT, new left-greater-than-right pleural effusions with associated partial left lower lobe collapse, obscuring a previously demonstrated nodular left lower lobe density. 3. Stable paramediastinal radiation changes on the right. No evidence of local recurrence or metastatic disease. 4. Central enlargement of the pulmonary arteries consistent with pulmonary arterial hypertension. 5. Aortic Atherosclerosis (ICD10-I70.0) and Emphysema (ICD10-J43.9). Electronically Signed  By: Richardean Sale M.D.   On: 11/10/2020 14:54      EKG: Not done   A & P   Principal Problem:   CAP  (community acquired pneumonia) Active Problems:   Essential hypertension   Hyperlipidemia   Non-small cell carcinoma of right lung, stage 2 ([facility])   Chronic respiratory failure with hypoxia ([facility])   CKD (chronic kidney disease) stage 3, GFR 30-59 ml/min ([facility])   1. Community-acquired pneumonia with history of non-small cell carcinoma of the right lung s/p right upper lobectomy, radiation and chemo a. Not in overt COPD exacerbation b. Continue ceftriaxone and azithromycin c. Sputum culture -broaden antibiotics pending culture d. MRSA nares e. As needed albuterol f. Continue Trelegy Ellipta  2. Bilateral pleural effusions, left greater than right with associated left lower lobe collapse a. Trial of Lasix  3. Chronic diastolic heart failure, not in acute exacerbation a. Monitor volume status  4. Chronic respiratory failure secondary to COPD, not in acute exacerbation a. Has some left-sided wheezes on exam but this is likely due to CAP as above b. Hold off on steroids for now and continue treatment as above  5. Anxiety a. Continue home Klonopin  6. Hypertension a. Continue home Coreg b. Hold losartan c. As needed hydralazine  7. Hyperlipidemia a. Continue statin  8. CKD 3 a, at baseline   DVT prophylaxis: Heparin   Code Status: Full Code  Diet: Heart healthy Family Communication: Admission, patients condition and plan of care including tests being ordered have been discussed with the patient who indicates understanding and agrees with the plan and Code Status.  Disposition Plan: The appropriate patient status for this patient is INPATIENT. Inpatient status is judged to be reasonable and necessary in order to provide the required intensity of service to ensure the patient's safety. The patient's presenting symptoms, physical exam findings, and initial radiographic and laboratory data in the context of their chronic comorbidities is felt to place them at high risk for further  clinical deterioration. Furthermore, it is not anticipated that the patient will be medically stable for discharge from the hospital within 2 midnights of admission. The following factors support the patient status of inpatient.   " The patient's presenting symptoms include shortness of breath and cough. " The worrisome physical exam findings include left-sided wheeze. " The initial radiographic and laboratory data are worrisome because of lobar consolidation. " The chronic co-morbidities include history of cancer.   * I certify that at the point of admission it is my clinical judgment that the patient will require inpatient hospital care spanning beyond 2 midnights from the point of admission due to high intensity of service, high risk for further deterioration and high frequency of surveillance required.*    Consultants  . None  Procedures  . None  Time Spent on Admission: 65 minutes    Harold Hedge, DO [facility]  11/10/2020, 5:36 PM

## 2020-11-10 NOTE — ED Notes (Signed)
Pt transported to CT ?

## 2020-11-11 DIAGNOSIS — I5032 Chronic diastolic (congestive) heart failure: Secondary | ICD-10-CM

## 2020-11-11 DIAGNOSIS — E782 Mixed hyperlipidemia: Secondary | ICD-10-CM | POA: Diagnosis not present

## 2020-11-11 DIAGNOSIS — N1831 Chronic kidney disease, stage 3a: Secondary | ICD-10-CM | POA: Diagnosis not present

## 2020-11-11 DIAGNOSIS — I10 Essential (primary) hypertension: Secondary | ICD-10-CM | POA: Diagnosis not present

## 2020-11-11 DIAGNOSIS — J189 Pneumonia, unspecified organism: Secondary | ICD-10-CM | POA: Diagnosis not present

## 2020-11-11 DIAGNOSIS — J441 Chronic obstructive pulmonary disease with (acute) exacerbation: Secondary | ICD-10-CM

## 2020-11-11 DIAGNOSIS — J209 Acute bronchitis, unspecified: Secondary | ICD-10-CM

## 2020-11-11 LAB — CBC
HCT: 37.8 % (ref 36.0–46.0)
Hemoglobin: 11.9 g/dL — ABNORMAL LOW (ref 12.0–15.0)
MCH: 29.7 pg (ref 26.0–34.0)
MCHC: 31.5 g/dL (ref 30.0–36.0)
MCV: 94.3 fL (ref 80.0–100.0)
Platelets: 332 10*3/uL (ref 150–400)
RBC: 4.01 MIL/uL (ref 3.87–5.11)
RDW: 15.9 % — ABNORMAL HIGH (ref 11.5–15.5)
WBC: 5.9 10*3/uL (ref 4.0–10.5)
nRBC: 0 % (ref 0.0–0.2)

## 2020-11-11 LAB — BASIC METABOLIC PANEL
Anion gap: 9 (ref 5–15)
BUN: 28 mg/dL — ABNORMAL HIGH (ref 8–23)
CO2: 33 mmol/L — ABNORMAL HIGH (ref 22–32)
Calcium: 9.6 mg/dL (ref 8.9–10.3)
Chloride: 90 mmol/L — ABNORMAL LOW (ref 98–111)
Creatinine, Ser: 1.55 mg/dL — ABNORMAL HIGH (ref 0.44–1.00)
GFR, Estimated: 34 mL/min — ABNORMAL LOW (ref >60)
Glucose, Bld: 104 mg/dL — ABNORMAL HIGH (ref 70–99)
Potassium: 4.8 mmol/L (ref 3.5–5.1)
Sodium: 132 mmol/L — ABNORMAL LOW (ref 135–145)

## 2020-11-11 LAB — URINALYSIS, ROUTINE W REFLEX MICROSCOPIC
Bilirubin Urine: NEGATIVE
Glucose, UA: NEGATIVE mg/dL
Hgb urine dipstick: NEGATIVE
Ketones, ur: NEGATIVE mg/dL
Leukocytes,Ua: NEGATIVE
Nitrite: NEGATIVE
Protein, ur: NEGATIVE mg/dL
Specific Gravity, Urine: 1.011 (ref 1.005–1.030)
pH: 5 (ref 5.0–8.0)

## 2020-11-11 LAB — STREP PNEUMONIAE URINARY ANTIGEN: Strep Pneumo Urinary Antigen: NEGATIVE

## 2020-11-11 MED ORDER — METHYLPREDNISOLONE SODIUM SUCC 125 MG IJ SOLR
60.0000 mg | Freq: Every day | INTRAMUSCULAR | Status: DC
  Administered 2020-11-11 – 2020-11-12 (×2): 60 mg via INTRAVENOUS
  Filled 2020-11-11 (×2): qty 2

## 2020-11-11 MED ORDER — GUAIFENESIN ER 600 MG PO TB12
1200.0000 mg | ORAL_TABLET | Freq: Two times a day (BID) | ORAL | Status: DC
  Administered 2020-11-11 – 2020-11-14 (×6): 1200 mg via ORAL
  Filled 2020-11-11 (×8): qty 2

## 2020-11-11 MED ORDER — FLUTICASONE PROPIONATE 50 MCG/ACT NA SUSP
2.0000 | Freq: Every day | NASAL | Status: DC
  Administered 2020-11-11 – 2020-11-14 (×4): 2 via NASAL
  Filled 2020-11-11: qty 16

## 2020-11-11 MED ORDER — BUDESONIDE 0.5 MG/2ML IN SUSP
0.5000 mg | Freq: Two times a day (BID) | RESPIRATORY_TRACT | Status: DC
  Administered 2020-11-11 – 2020-11-14 (×7): 0.5 mg via RESPIRATORY_TRACT
  Filled 2020-11-11 (×6): qty 2

## 2020-11-11 MED ORDER — PANTOPRAZOLE SODIUM 40 MG PO TBEC
40.0000 mg | DELAYED_RELEASE_TABLET | Freq: Every day | ORAL | Status: DC
  Administered 2020-11-11 – 2020-11-14 (×4): 40 mg via ORAL
  Filled 2020-11-11 (×4): qty 1

## 2020-11-11 MED ORDER — IPRATROPIUM-ALBUTEROL 0.5-2.5 (3) MG/3ML IN SOLN
3.0000 mL | Freq: Three times a day (TID) | RESPIRATORY_TRACT | Status: DC
  Administered 2020-11-11 – 2020-11-12 (×4): 3 mL via RESPIRATORY_TRACT
  Filled 2020-11-11 (×4): qty 3

## 2020-11-11 MED ORDER — GUAIFENESIN-DM 100-10 MG/5ML PO SYRP
5.0000 mL | ORAL_SOLUTION | ORAL | Status: DC | PRN
  Administered 2020-11-11 – 2020-11-12 (×3): 5 mL via ORAL
  Filled 2020-11-11 (×3): qty 10

## 2020-11-11 MED ORDER — LORATADINE 10 MG PO TABS
10.0000 mg | ORAL_TABLET | Freq: Every day | ORAL | Status: DC
  Administered 2020-11-12 – 2020-11-14 (×3): 10 mg via ORAL
  Filled 2020-11-11 (×4): qty 1

## 2020-11-11 NOTE — Progress Notes (Signed)
PROGRESS NOTE    Tammy Ball  CHE:[id number] DOB: 1941/07/14 DOA: 11/10/2020 PCP: Forrest Moron, MD    Chief Complaint  Patient presents with  . Fatigue    Brief Narrative:  HPI per Dr. [name] This is a 79 year old female with past medical history of COPD, HFpEF, hypertension, hyperlipidemia, non-small cell carcinoma of the right lung s/p right upper lobectomy, radiation and chemo, RCC s/p left nephrectomy who presented to the ED with flulike symptoms x1 week.  She has noted fever up to 102 F, purulent cough with some scant hemoptysis, sore throat, diarrhea and abdominal discomfort for that time.  She was initially seen in the ED on 12/25 for the same at which point work-up showed leukocytosis and possible lung consolidation versus mass but negative Covid test and negative CT abdomen pelvis and she was discharged with doxycycline as she requested to go home and spend time with family for Christmas.  She has been taking doxycycline as prescribed without improvement.  Has had increased O2 requirements at home (has home O2 for COPD but has not needed for the past year).     ED Course: Afebrile, hypertensive, Placed on nasal cannula.  notable Labs: Sodium 132, K4.5, BUN 27, creatinine 1.44, WBC 5.3, Hb 12.5, lactic acid 0.8, COVID-19 and flu negative. Notable Imaging: CT chest with contrast-concern for left lower lobe pneumonia and new left greater than right pleural effusions with partial left lower lobe collapse, pulmonary arterial hypertension, aortic atherosclerosis and emphysema. Patient received Ceftriaxone and azithromycin and Tylenol   Assessment & Plan:   Principal Problem:   CAP (community acquired pneumonia) Active Problems:   Essential hypertension   Hyperlipidemia   Non-small cell carcinoma of right lung, stage 2 (HCC)   Chronic respiratory failure with hypoxia (HCC)   CKD (chronic kidney disease) stage 3, GFR 30-59 ml/min (HCC)  1 left-sided community-acquired  pneumonia with underlying history of non-small cell carcinoma of the right lung status post right upper lobectomy, radiation and chemo Patient presented with worsening shortness of breath, wheezing, cough with no significant improvement on oral antibiotics.  Patient failed outpatient treatment.  CT chest done on admission consistent with left lower lobe pneumonia and new left > right pleural effusion and partial left lower lobe collapse. Patient slowly improving clinically.  Currently afebrile.  Leukocytosis trending down.  Currently on 3 L nasal cannula with sats in the low 90s.  Continue IV Rocephin, IV azithromycin.  Placed on Pulmicort, Flonase, Mucinex, scheduled duo nebs, Claritin.  Supportive care.  2.  Mild COPD exacerbation Likely triggered by problem #1.  Place on Pulmicort, scheduled duo nebs, PPI, Flonase, Claritin, IV Solu-Medrol 60 mg daily.  Continue Parikh IV antibiotics.  Follow.  3.  Bilateral pleural effusions L > R. Status post Lasix 40 mg IV x1 with urine output of 850 cc over the past 24 hours.  Patient with some clinical improvement.  Follow.  4.  Chronic diastolic heart failure Not in acute exacerbation.  Status post Lasix 40 mg IV x1.  Continue aspirin, statin, Coreg.  Follow.  5.  Chronic respiratory failure secondary to COPD Patient noted to have some wheezes on examination.  Placed on Solu-Medrol 60 mg IV daily.  Placed on Pulmicort, scheduled duo nebs.  Follow.  6.  Anxiety Continue home regimen Klonopin.  7.  Hypertension Continue to hold ARB.  Continue Coreg.  8.  Hyperlipidemia Statin.  9.  Chronic kidney disease stage IIIa Stable.  At baseline.  Follow.  DVT prophylaxis: Heparin Code Status: Full Family Communication: Updated patient and son at bedside. Disposition:   Status is: Inpatient    Dispo: The patient is from: Home              Anticipated d/c is to: Home              Anticipated d/c date is: 2 to 3 days.              Patient  currently on IV antibiotics, nebulizer treatments.  Not stable for discharge.       Consultants:   None  Procedures:   CT chest 11/10/2020   Antimicrobials:   IV Rocephin 11/10/2020>>>  IV azithromycin 11/10/2020>>>>   Subjective: Patient states feeling better than she did on admission.  Still with some wheezing.  Still with cough.  Denies any chest pain.  No abdominal pain.  Objective: Vitals:   11/10/20 2331 11/11/20 0130 11/11/20 0420 11/11/20 0849  BP:  123/68 135/78   Pulse:  74 79   Resp:  16 16   Temp:  98.6 F (37 C) 98.9 F (37.2 C)   TempSrc:      SpO2: 97% 96% 97% 93%  Weight:      Height:        Intake/Output Summary (Last 24 hours) at 11/11/2020 1213 Last data filed at 11/11/2020 1015 Gross per 24 hour  Intake 1770 ml  Output 1150 ml  Net 620 ml   Filed Weights   11/10/20 1034  Weight: 83.9 kg    Examination:  General exam: Appears calm and comfortable  Respiratory system: Scattered coarse breath sounds.  Expiratory wheezing.  No crackles.  Fair air movement.  Speaking in full sentences.  Normal respiratory effort.  Cardiovascular system: S1 & S2 heard, RRR. No JVD, murmurs, rubs, gallops or clicks. No pedal edema. Gastrointestinal system: Abdomen is nondistended, soft and nontender. No organomegaly or masses felt. Normal bowel sounds heard. Central nervous system: Alert and oriented. No focal neurological deficits. Extremities: Symmetric 5 x 5 power. Skin: No rashes, lesions or ulcers Psychiatry: Judgement and insight appear normal. Mood & affect appropriate.     Data Reviewed: I have personally reviewed following labs and imaging studies  CBC: Recent Labs  Lab 11/05/20 1050 11/10/20 1221 11/11/20 0301  WBC 17.8* 5.3 5.9  NEUTROABS 14.5* 3.7  --   HGB 12.0 12.5 11.9*  HCT 37.1 38.7 37.8  MCV 93.7 92.6 94.3  PLT 222 344 009    Basic Metabolic Panel: Recent Labs  Lab 11/05/20 1050 11/10/20 1221 11/11/20 0301  NA 131*  132* 132*  K 4.6 4.5 4.8  CL 96* 94* 90*  CO2 24 27 33*  GLUCOSE 122* 101* 104*  BUN 21 27* 28*  CREATININE 1.79* 1.44* 1.55*  CALCIUM 8.5* 9.9 9.6    GFR: Estimated Creatinine Clearance: 31.5 mL/min (A) (by C-G formula based on SCr of 1.55 mg/dL (H)).  Liver Function Tests: Recent Labs  Lab 11/05/20 1050 11/10/20 1221  AST 17 25  ALT 14 23  ALKPHOS 73 88  BILITOT 1.3* 0.7  PROT 6.6 7.2  ALBUMIN 3.3* 3.4*    CBG: No results for input(s): GLUCAP in the last 168 hours.   Recent Results (from the past 240 hour(s))  Blood culture (routine single)     Status: None   Collection Time: 11/05/20 10:50 AM   Specimen: BLOOD  Result Value Ref Range Status   Specimen Description   Final  BLOOD LEFT ANTECUBITAL Performed at [facility] [address], [place], [place] [address]    Special Requests   Final    BOTTLES DRAWN AEROBIC AND ANAEROBIC Blood Culture results may not be optimal due to an excessive volume of blood received in culture bottles Performed at [facility] [address], [place], [place] [address]    Culture   Final    NO GROWTH 5 DAYS Performed at [facility], [place] [address], [place], [place] [address]    Report Status 11/10/2020 FINAL  Final  Urine culture     Status: Abnormal   Collection Time: 11/05/20 11:03 AM   Specimen: In/Out Cath Urine  Result Value Ref Range Status   Specimen Description   Final    IN/OUT CATH URINE Performed at [facility] [address] [place], [place] [address]    Special Requests   Final    NONE Performed at [facility], [place] [address], [place], [place] [address]    Culture MULTIPLE SPECIES PRESENT, SUGGEST RECOLLECTION (A)  Final   Report Status 11/06/2020 FINAL  Final  Resp Panel by RT-PCR (Flu A&B, Covid) Nasopharyngeal Swab     Status: None   Collection Time: 11/05/20 11:22 AM   Specimen: Nasopharyngeal Swab;  Nasopharyngeal(NP) swabs in vial transport medium  Result Value Ref Range Status   SARS Coronavirus 2 by RT PCR NEGATIVE NEGATIVE Final    Comment: (NOTE) SARS-CoV-2 target nucleic acids are NOT DETECTED.  The SARS-CoV-2 RNA is generally detectable in upper respiratory specimens during the acute phase of infection. The lowest concentration of SARS-CoV-2 viral copies this assay can detect is 138 copies/mL. A negative result does not preclude SARS-Cov-2 infection and should not be used as the sole basis for treatment or other patient management decisions. A negative result may occur with  improper specimen collection/handling, submission of specimen other than nasopharyngeal swab, presence of viral mutation(s) within the areas targeted by this assay, and inadequate number of viral copies(<138 copies/mL). A negative result must be combined with clinical observations, patient history, and epidemiological information. The expected result is Negative.  Fact Sheet for Patients:  EntrepreneurPulse.com.au  Fact Sheet for Healthcare Providers:  IncredibleEmployment.be  This test is no t yet approved or cleared by the [place] FDA and  has been authorized for detection and/or diagnosis of SARS-CoV-2 by FDA under an Emergency Use Authorization (EUA). This EUA will remain  in effect (meaning this test can be used) for the duration of the COVID-19 declaration under Section 564(b)(1) of the Act, 21 U.S.C.section 360bbb-3(b)(1), unless the authorization is terminated  or revoked sooner.       Influenza A by PCR NEGATIVE NEGATIVE Final   Influenza B by PCR NEGATIVE NEGATIVE Final    Comment: (NOTE) The Xpert Xpress SARS-CoV-2/FLU/RSV plus assay is intended as an aid in the diagnosis of influenza from Nasopharyngeal swab specimens and should not be used as a sole basis for treatment. Nasal washings and aspirates are unacceptable for Xpert Xpress  SARS-CoV-2/FLU/RSV testing.  Fact Sheet for Patients: EntrepreneurPulse.com.au  Fact Sheet for Healthcare Providers: IncredibleEmployment.be  This test is not yet approved or cleared by the [place] FDA and has been authorized for detection and/or diagnosis of SARS-CoV-2 by FDA under an Emergency Use Authorization (EUA). This EUA will remain in effect (meaning this test can be used) for the duration of the COVID-19 declaration under Section 564(b)(1) of the Act, 21 U.S.C.  section 360bbb-3(b)(1), unless the authorization is terminated or revoked.  Performed at [facility], [place] [address] [place], [place] [address]   Culture, blood (routine x 2)     Status: None (Preliminary result)   Collection Time: 11/10/20 12:21 PM   Specimen: BLOOD  Result Value Ref Range Status   Specimen Description   Final    BLOOD LEFT ANTECUBITAL Performed at [facility] [address] [place], [place] [address]    Special Requests   Final    BOTTLES DRAWN AEROBIC AND ANAEROBIC Blood Culture results may not be optimal due to an inadequate volume of blood received in culture bottles Performed at [facility] [address] [place], [place] [address]    Culture   Final    NO GROWTH < 24 HOURS Performed at [facility] [address], [place], [place] [address]    Report Status PENDING  Incomplete  Culture, blood (routine x 2)     Status: None (Preliminary result)   Collection Time: 11/10/20 12:21 PM   Specimen: BLOOD  Result Value Ref Range Status   Specimen Description   Final    BLOOD RIGHT ANTECUBITAL Performed at [facility] [address] [place], [place] [address]    Special Requests   Final    BOTTLES DRAWN AEROBIC AND ANAEROBIC Blood Culture adequate volume Performed at [facility] [address] [place], [place] [address]    Culture    Final    NO GROWTH < 12 HOURS Performed at [facility] [address], [place], [place] [address]    Report Status PENDING  Incomplete  Resp Panel by RT-PCR (Flu A&B, Covid) Nasopharyngeal Swab     Status: None   Collection Time: 11/10/20 12:32 PM   Specimen: Nasopharyngeal Swab; Nasopharyngeal(NP) swabs in vial transport medium  Result Value Ref Range Status   SARS Coronavirus 2 by RT PCR NEGATIVE NEGATIVE Final    Comment: (NOTE) SARS-CoV-2 target nucleic acids are NOT DETECTED.  The SARS-CoV-2 RNA is generally detectable in upper respiratory specimens during the acute phase of infection. The lowest concentration of SARS-CoV-2 viral copies this assay can detect is 138 copies/mL. A negative result does not preclude SARS-Cov-2 infection and should not be used as the sole basis for treatment or other patient management decisions. A negative result may occur with  improper specimen collection/handling, submission of specimen other than nasopharyngeal swab, presence of viral mutation(s) within the areas targeted by this assay, and inadequate number of viral copies(<138 copies/mL). A negative result must be combined with clinical observations, patient history, and epidemiological information. The expected result is Negative.  Fact Sheet for Patients:  EntrepreneurPulse.com.au  Fact Sheet for Healthcare Providers:  IncredibleEmployment.be  This test is no t yet approved or cleared by the [place] FDA and  has been authorized for detection and/or diagnosis of SARS-CoV-2 by FDA under an Emergency Use Authorization (EUA). This EUA will remain  in effect (meaning this test can be used) for the duration of the COVID-19 declaration under Section 564(b)(1) of the Act, 21 U.S.C.section 360bbb-3(b)(1), unless the authorization is terminated  or revoked sooner.       Influenza A by PCR NEGATIVE NEGATIVE Final   Influenza B by PCR NEGATIVE  NEGATIVE Final    Comment: (NOTE) The Xpert Xpress SARS-CoV-2/FLU/RSV plus assay is intended as an aid in the diagnosis of influenza from Nasopharyngeal swab specimens and should  not be used as a sole basis for treatment. Nasal washings and aspirates are unacceptable for Xpert Xpress SARS-CoV-2/FLU/RSV testing.  Fact Sheet for Patients: EntrepreneurPulse.com.au  Fact Sheet for Healthcare Providers: IncredibleEmployment.be  This test is not yet approved or cleared by the [place] FDA and has been authorized for detection and/or diagnosis of SARS-CoV-2 by FDA under an Emergency Use Authorization (EUA). This EUA will remain in effect (meaning this test can be used) for the duration of the COVID-19 declaration under Section 564(b)(1) of the Act, 21 U.S.C. section 360bbb-3(b)(1), unless the authorization is terminated or revoked.  Performed at [facility], [place] [address], [place], [place] [address]   Group A Strep by PCR     Status: None   Collection Time: 11/10/20 12:32 PM   Specimen: Throat; Sterile Swab  Result Value Ref Range Status   Group A Strep by PCR NOT DETECTED NOT DETECTED Final    Comment: Performed at [facility], [place] [address] [place], [place] [address]  MRSA PCR Screening     Status: None   Collection Time: 11/10/20  6:50 PM   Specimen: Nasal Mucosa; Nasopharyngeal  Result Value Ref Range Status   MRSA by PCR NEGATIVE NEGATIVE Final    Comment:        The GeneXpert MRSA Assay (FDA approved for NASAL specimens only), is one component of a comprehensive MRSA colonization surveillance program. It is not intended to diagnose MRSA infection nor to guide or monitor treatment for MRSA infections. Performed at [facility], [place] [address] [place], [place] [address]   Sputum culture     Status: None   Collection Time: 11/10/20 11:00 PM   Specimen: Expectorated  Sputum  Result Value Ref Range Status   Specimen Description EXPECTORATED SPUTUM  Final   Special Requests NONE  Final   Sputum evaluation   Final    THIS SPECIMEN IS ACCEPTABLE FOR SPUTUM CULTURE Performed at [facility], [place] [address] [place] [address] [place], [place] [address]    Report Status 11/10/2020 FINAL  Final  Culture, respiratory     Status: None (Preliminary result)   Collection Time: 11/10/20 11:00 PM  Result Value Ref Range Status   Specimen Description   Final    EXPECTORATED SPUTUM Performed at [facility], [place] [address] [place], [place] [address]    Special Requests   Final    NONE Reflexed from [id number] Performed at [facility], [place] [address], [place], [place] [address]    Gram Stain   Final    NO WBC SEEN RARE GRAM POSITIVE COCCI IN PAIRS RARE GRAM POSITIVE RODS Performed at [facility], [place] [address] [place], [place] [address]    Culture PENDING  Incomplete   Report Status PENDING  Incomplete         Radiology Studies: CT Chest W Contrast  Result Date: 11/10/2020 CLINICAL DATA:  Non-small cell lung cancer. Cough with hemoptysis and shortness of breath. Reported recent negative COVID testing. EXAM: CT CHEST WITH CONTRAST TECHNIQUE: Multidetector CT imaging of the chest was performed during intravenous contrast administration. CONTRAST:  2mL OMNIPAQUE IOHEXOL 300 MG/ML  SOLN COMPARISON:  Abdominal CT and chest radiographs 11/05/2020. Chest CT 05/30/2020 and 12/01/2019 FINDINGS: Cardiovascular: Atherosclerosis of the aorta, great vessels and coronary arteries. There is central enlargement of the pulmonary arteries consistent with pulmonary arterial hypertension. The heart size is stable. No significant pericardial fluid. Mediastinum/Nodes: There are no  enlarged mediastinal, hilar or axillary lymph nodes. The thyroid gland, trachea and esophagus demonstrate no significant findings. Lungs/Pleura: Moderate  centrilobular and paraseptal emphysema with stable postsurgical changes from previous right upper lobe resection. There are stable paramediastinal radiation changes on the right. Compared with the recent abdominal CT, there are new trace and small left pleural effusions. There is associated increased compressive atelectasis and opacity within the left lower lobe obscuring the previously demonstrated nodular density. In addition, there are patchy airspace opacities with air bronchograms throughout the lingula, likely reflecting pneumonia. These were seen on recent chest radiographs. There is a somewhat nodular component measuring up to 2.8 x 2.2 cm on image 62/7, containing an air bronchogram. No suspicious right lung nodules. Upper abdomen: The visualized upper abdomen appears stable without significant findings post left nephrectomy. Musculoskeletal/Chest wall: There is no chest wall mass or suspicious osseous finding. Previous C6-7 ACDF. Stable compression deformities at T3, T4 and T6 with post spinal augmentation changes at T3 and T4. IMPRESSION: 1. New patchy airspace opacities with air bronchograms throughout the lingula and left lower lobe, likely reflecting pneumonia. Radiographic follow-up necessary to document resolution. 2. Compared with recent abdominal CT, new left-greater-than-right pleural effusions with associated partial left lower lobe collapse, obscuring a previously demonstrated nodular left lower lobe density. 3. Stable paramediastinal radiation changes on the right. No evidence of local recurrence or metastatic disease. 4. Central enlargement of the pulmonary arteries consistent with pulmonary arterial hypertension. 5. Aortic Atherosclerosis (ICD10-I70.0) and Emphysema (ICD10-J43.9). Electronically Signed   By: Richardean Sale M.D.   On: 11/10/2020 14:54        Scheduled Meds: . aspirin EC  81 mg Oral Daily  . atorvastatin  40 mg Oral Daily  . budesonide (PULMICORT) nebulizer solution   0.5 mg Nebulization BID  . carvedilol  12.5 mg Oral BID  . fluticasone  2 spray Each Nare Daily  . guaiFENesin  1,200 mg Oral BID  . heparin  5,000 Units Subcutaneous Q8H  . ipratropium-albuterol  3 mL Nebulization TID  . loratadine  10 mg Oral Daily  . methylPREDNISolone (SOLU-MEDROL) injection  60 mg Intravenous Daily  . pantoprazole  40 mg Oral Q0600   Continuous Infusions: . azithromycin    . cefTRIAXone (ROCEPHIN)  IV       LOS: 1 day    Time spent: 40 minutes    Irine Seal, MD [facility]   To contact the attending provider between 7A-7P or the covering provider during after hours 7P-7A, please log into the web site [email or web address] and access using universal [place] password for that web site. If you do not have the password, please call the hospital operator.  11/11/2020, 12:13 PM

## 2020-11-11 NOTE — TOC Initial Note (Signed)
Transition of Care [facility]) - Initial/Assessment Note    Patient Details  Name: Tammy Ball MRN: [id number] Date of Birth: 1940/11/23  Transition of Care [facility]) CM/SW Contact:    Lennart Pall, LCSW Phone Number: 11/11/2020, 10:59 AM  Clinical Narrative:                 Met with pt this morning to introduce self/role.  Pt very pleasant but reports worsening fatigue and "feeling bad" over past few days.  She does live alone but has a son and daughter both living very close by and very supportive.  She is independent PTA and, while she does have oxygen set up at home, she does not usually need to use "except when I'm sick".  Pt aware that TOC will follow along to assist with any dc needs.  Expected Discharge Plan: Home/Self Care Barriers to Discharge: Continued Medical Work up   Patient Goals and CMS Choice Patient states their goals for this hospitalization and ongoing recovery are:: return home      Expected Discharge Plan and Services Expected Discharge Plan: Home/Self Care In-house Referral: Clinical Social Work     Living arrangements for the past 2 months: Single Family Home                                      Prior Living Arrangements/Services Living arrangements for the past 2 months: Single Family Home Lives with:: Self Patient language and need for interpreter reviewed:: Yes Do you feel safe going back to the place where you live?: Yes      Need for Family Participation in Patient Care: No (Comment) Care giver support system in place?: Yes (comment) Current home services: DME (has oxygen at home already) Criminal Activity/Legal Involvement Pertinent to Current Situation/Hospitalization: No - Comment as needed  Activities of Daily Living Home Assistive Devices/Equipment: Gilford Rile (specify type) ADL Screening (condition at time of admission) Patient's cognitive ability adequate to safely complete daily activities?: Yes Is the patient deaf or have difficulty  hearing?: No Does the patient have difficulty seeing, even when wearing glasses/contacts?: No Does the patient have difficulty concentrating, remembering, or making decisions?: No Patient able to express need for assistance with ADLs?: Yes Does the patient have difficulty dressing or bathing?: No Independently performs ADLs?: Yes (appropriate for developmental age) Does the patient have difficulty walking or climbing stairs?: No Weakness of Legs: None Weakness of Arms/Hands: None  Permission Sought/Granted                  Emotional Assessment Appearance:: Appears stated age Attitude/Demeanor/Rapport: Engaged,Gracious Affect (typically observed): Accepting,Appropriate Orientation: : Oriented to Self,Oriented to Place,Oriented to  Time,Oriented to Situation Alcohol / Substance Use: Not Applicable Psych Involvement: No (comment)  Admission diagnosis:  CAP (community acquired pneumonia) [J18.9] Community acquired pneumonia, unspecified laterality [J18.9] Patient Active Problem List   Diagnosis Date Noted  . CAP (community acquired pneumonia) 11/10/2020  . CKD (chronic kidney disease) stage 3, GFR 30-59 ml/min (HCC) 11/10/2020  . HCAP (healthcare-associated pneumonia) 01/07/2019  . Allergic rhinitis 01/06/2019  . Chronic respiratory failure with hypoxia ([place]) 12/26/2018  . Age-related osteoporosis with current pathological fracture 11/21/2018  . Renal insufficiency 05/20/2018  . History of compression fracture of spine 12/10/2016  . Hypercalcemia 06/25/2016  . Insomnia 05/16/2016  . Pneumonitis, radiation ([place]) 04/11/2016  . S/P lobectomy of lung 01/18/2016  . Chemotherapy-induced neuropathy ([place]) 11/01/2015  .  Non-small cell carcinoma of right lung, stage 2 ([place]) 10/03/2015  . Hyperlipidemia 08/14/2013  . Spondylolisthesis of lumbar region 07/03/2013  . Essential hypertension 12/17/2011  . COPD GOLD II  12/17/2011  . Renal cell cancer ([place]) 12/17/2011  . Sciatica of left  side 12/17/2011  . Depression 12/17/2011   PCP:  Forrest Moron, MD Pharmacy:   [id number] [place], [id number]: [phone]: [phone] [id number] [address] [id number] [id number]: [phone]: [phone]   Social Determinants of Health (SDOH) Interventions    Readmission Risk Interventions Readmission Risk Prevention Plan 11/11/2020  Transportation Screening Complete  PCP or Specialist Appt within 5-7 Days Complete  Home Care Screening Complete  Some recent data might be hidden

## 2020-11-12 DIAGNOSIS — J9611 Chronic respiratory failure with hypoxia: Secondary | ICD-10-CM | POA: Diagnosis not present

## 2020-11-12 DIAGNOSIS — N1831 Chronic kidney disease, stage 3a: Secondary | ICD-10-CM | POA: Diagnosis not present

## 2020-11-12 DIAGNOSIS — I5032 Chronic diastolic (congestive) heart failure: Secondary | ICD-10-CM | POA: Diagnosis not present

## 2020-11-12 DIAGNOSIS — J189 Pneumonia, unspecified organism: Secondary | ICD-10-CM | POA: Diagnosis not present

## 2020-11-12 LAB — CBC
HCT: 35.3 % — ABNORMAL LOW (ref 36.0–46.0)
Hemoglobin: 11.4 g/dL — ABNORMAL LOW (ref 12.0–15.0)
MCH: 29.8 pg (ref 26.0–34.0)
MCHC: 32.3 g/dL (ref 30.0–36.0)
MCV: 92.2 fL (ref 80.0–100.0)
Platelets: 392 10*3/uL (ref 150–400)
RBC: 3.83 MIL/uL — ABNORMAL LOW (ref 3.87–5.11)
RDW: 15.7 % — ABNORMAL HIGH (ref 11.5–15.5)
WBC: 4.3 10*3/uL (ref 4.0–10.5)
nRBC: 0 % (ref 0.0–0.2)

## 2020-11-12 LAB — BASIC METABOLIC PANEL
Anion gap: 10 (ref 5–15)
BUN: 35 mg/dL — ABNORMAL HIGH (ref 8–23)
CO2: 31 mmol/L (ref 22–32)
Calcium: 9.4 mg/dL (ref 8.9–10.3)
Chloride: 93 mmol/L — ABNORMAL LOW (ref 98–111)
Creatinine, Ser: 1.29 mg/dL — ABNORMAL HIGH (ref 0.44–1.00)
GFR, Estimated: 43 mL/min — ABNORMAL LOW (ref >60)
Glucose, Bld: 139 mg/dL — ABNORMAL HIGH (ref 70–99)
Potassium: 5 mmol/L (ref 3.5–5.1)
Sodium: 134 mmol/L — ABNORMAL LOW (ref 135–145)

## 2020-11-12 LAB — URINE CULTURE: Culture: NO GROWTH

## 2020-11-12 LAB — HIV ANTIBODY (ROUTINE TESTING W REFLEX): HIV Screen 4th Generation wRfx: NONREACTIVE

## 2020-11-12 MED ORDER — CALCIUM CARBONATE ANTACID 500 MG PO CHEW
1.0000 | CHEWABLE_TABLET | Freq: Every day | ORAL | Status: DC | PRN
  Filled 2020-11-12: qty 1

## 2020-11-12 MED ORDER — IPRATROPIUM-ALBUTEROL 0.5-2.5 (3) MG/3ML IN SOLN
3.0000 mL | Freq: Two times a day (BID) | RESPIRATORY_TRACT | Status: DC
  Administered 2020-11-12 – 2020-11-14 (×4): 3 mL via RESPIRATORY_TRACT
  Filled 2020-11-12 (×4): qty 3

## 2020-11-12 MED ORDER — OMEGA-3-ACID ETHYL ESTERS 1 G PO CAPS
1.0000 | ORAL_CAPSULE | Freq: Every day | ORAL | Status: DC
  Administered 2020-11-12 – 2020-11-14 (×3): 1 g via ORAL
  Filled 2020-11-12 (×4): qty 1

## 2020-11-12 MED ORDER — PREDNISONE 20 MG PO TABS
40.0000 mg | ORAL_TABLET | Freq: Every day | ORAL | Status: DC
  Administered 2020-11-13 – 2020-11-14 (×2): 40 mg via ORAL
  Filled 2020-11-12 (×2): qty 2

## 2020-11-12 MED ORDER — ASCORBIC ACID 500 MG PO TABS
1000.0000 mg | ORAL_TABLET | Freq: Every day | ORAL | Status: DC
  Administered 2020-11-12 – 2020-11-14 (×3): 1000 mg via ORAL
  Filled 2020-11-12 (×2): qty 2

## 2020-11-12 MED ORDER — AZITHROMYCIN 250 MG PO TABS
500.0000 mg | ORAL_TABLET | Freq: Every day | ORAL | Status: DC
  Administered 2020-11-12 – 2020-11-13 (×2): 500 mg via ORAL
  Filled 2020-11-12 (×2): qty 2

## 2020-11-12 MED ORDER — POLYVINYL ALCOHOL 1.4 % OP SOLN: 2.0000 [drp] | Freq: Every day | OPHTHALMIC | Status: DC | PRN

## 2020-11-12 MED ORDER — CEFDINIR 300 MG PO CAPS
300.0000 mg | ORAL_CAPSULE | Freq: Two times a day (BID) | ORAL | Status: DC
  Administered 2020-11-13 – 2020-11-14 (×3): 300 mg via ORAL
  Filled 2020-11-12 (×3): qty 1

## 2020-11-12 NOTE — Evaluation (Signed)
Physical Therapy Evaluation Patient Details Name: Tammy Ball MRN: [id number] DOB: 1941-01-27 Today's Date: 11/12/2020    SATURATION QUALIFICATIONS: (This note is used to comply with regulatory documentation for home oxygen)  Patient Saturations on Room Air at Rest = 84%  Patient Saturations on [employer] while Ambulating = n/a  Patient Saturations on 3 Liters of oxygen while Ambulating = 89%      History of Present Illness  80 yo female admitted with Pna, weakness. Hx of COPE, depression, glaucoma, lung ca s/p lobectomy/radiation/chemo, anxiety, L nephrectomy  Clinical Impression  On eval, pt required Min assist for mobility. She walked ~115 feet with use of hallway handrail for support. Pt is unsteady and at risk for falls. Recommend Rw use for ambulation safety. Pt tolerated activity fairly well. Will plan to follow and progress activity as tolerated.     Follow Up Recommendations Home health PT    Equipment Recommendations  None recommended by PT    Recommendations for Other Services       Precautions / Restrictions Precautions Precautions: Fall Precaution Comments: monitor O2 Restrictions Weight Bearing Restrictions: No      Mobility  Bed Mobility Overal bed mobility: Modified Independent                  Transfers Overall transfer level: Needs assistance Equipment used: None Transfers: Sit to/from Stand Sit to Stand: Min assist         General transfer comment: assist to steady.  Ambulation/Gait Ambulation/Gait assistance: Min assist Gait Distance (Feet): 115 Feet Assistive device: None (vs hallway handrail) Gait Pattern/deviations: Step-through pattern;Decreased stride length     General Gait Details: Assist to steady throughout ambulation distance. Gait is slow and guarded. Unsteady and at risk for falls. Recommend use of RW for ambulation safety.  Stairs            Wheelchair Mobility    Modified Rankin (Stroke Patients  Only)       Balance Overall balance assessment: Needs assistance           Standing balance-Leahy Scale: Fair                               Pertinent Vitals/Pain Pain Assessment: Faces Faces Pain Scale: Hurts little more Pain Location: chronic back pain Pain Descriptors / Indicators: Discomfort;Sore Pain Intervention(s): Limited activity within patient's tolerance;Monitored during session;Repositioned    Home Living Family/patient expects to be discharged to:: Private residence Living Arrangements: Alone Available Help at Discharge: Family Type of Home: House Home Access: Stairs to enter Entrance Stairs-Rails: Right Entrance Stairs-Number of Steps: 2 Home Layout: Able to live on main level with bedroom/bathroom;Two level Home Equipment: Walker - 2 wheels      Prior Function Level of Independence: Independent         Comments: still working up until this illness/hospital admission     Hand Dominance        Extremity/Trunk Assessment   Upper Extremity Assessment Upper Extremity Assessment: Overall WFL for tasks assessed    Lower Extremity Assessment Lower Extremity Assessment: Generalized weakness    Cervical / Trunk Assessment Cervical / Trunk Assessment: Normal  Communication   Communication: No difficulties  Cognition Arousal/Alertness: Awake/alert Behavior During Therapy: WFL for tasks assessed/performed Overall Cognitive Status: Within Functional Limits for tasks assessed  General Comments      Exercises     Assessment/Plan    PT Assessment Patient needs continued PT services  PT Problem List Decreased strength;Decreased mobility;Decreased activity tolerance;Decreased balance       PT Treatment Interventions DME instruction;Gait training;Therapeutic activities;Therapeutic exercise;Patient/family education;Balance training;Functional mobility training    PT Goals  (Current goals can be found in the Care Plan section)  Acute Rehab PT Goals Patient Stated Goal: home soon PT Goal Formulation: With patient Time For Goal Achievement: 11/26/20 Potential to Achieve Goals: Good    Frequency Min 3X/week   Barriers to discharge        Co-evaluation               AM-PAC PT "6 Clicks" Mobility  Outcome Measure Help needed turning from your back to your side while in a flat bed without using bedrails?: None Help needed moving from lying on your back to sitting on the side of a flat bed without using bedrails?: None Help needed moving to and from a bed to a chair (including a wheelchair)?: A Little Help needed standing up from a chair using your arms (e.g., wheelchair or bedside chair)?: A Little Help needed to walk in hospital room?: A Little Help needed climbing 3-5 steps with a railing? : A Little 6 Click Score: 20    End of Session Equipment Utilized During Treatment: Oxygen;Gait belt Activity Tolerance: Patient tolerated treatment well Patient left: in chair;with call bell/phone within reach        Time: 0927-0946 PT Time Calculation (min) (ACUTE ONLY): 19 min   Charges:   PT Evaluation $PT Eval Low Complexity: [place], PT Acute Rehabilitation  Office: [phone] Pager: [phone]

## 2020-11-12 NOTE — Plan of Care (Signed)

## 2020-11-12 NOTE — Plan of Care (Signed)
°  Problem: Education: °Goal: Knowledge of General Education information will improve °Description: Including pain rating scale, medication(s)/side effects and non-pharmacologic comfort measures °Outcome: Progressing °  °Problem: Clinical Measurements: °Goal: Diagnostic test results will improve °Outcome: Progressing °  °Problem: Clinical Measurements: °Goal: Respiratory complications will improve °Outcome: Progressing °  °Problem: Activity: °Goal: Risk for activity intolerance will decrease °Outcome: Progressing °  °

## 2020-11-12 NOTE — Progress Notes (Signed)
PHARMACIST - PHYSICIAN COMMUNICATION DR:   Grandville Silos CONCERNING: Antibiotic IV to Oral Route Change Policy  RECOMMENDATION: This patient is receiving zithromax by the intravenous route.  Based on criteria approved by the Pharmacy and Therapeutics Committee, the antibiotic(s) is/are being converted to the equivalent oral dose form(s).   DESCRIPTION: These criteria include:  Patient being treated for a respiratory tract infection, urinary tract infection, cellulitis or clostridium difficile associated diarrhea if on metronidazole  The patient is not neutropenic and does not exhibit a GI malabsorption state  The patient is eating (either orally or via tube) and/or has been taking other orally administered medications for a least 24 hours  The patient is improving clinically and has a Tmax < 100.5  If you have questions about this conversion, please contact the Pharmacy Department  []   [phone] )  [facility] []   [phone] )  [facility] []   [phone] )  [facility] []   [phone] )  [facility] [x]   [phone] )  [place], PharmD, BCPS 11/12/2020 11:20 AM

## 2020-11-12 NOTE — Progress Notes (Signed)
PROGRESS NOTE    JARRAH SEHER  DGU:[id number] DOB: 12/17/1940 DOA: 11/10/2020 PCP: Forrest Moron, MD    Chief Complaint  Patient presents with  . Fatigue    Brief Narrative:  HPI per Dr. [name] This is a 80 year old female with past medical history of COPD, HFpEF, hypertension, hyperlipidemia, non-small cell carcinoma of the right lung s/p right upper lobectomy, radiation and chemo, RCC s/p left nephrectomy who presented to the ED with flulike symptoms x1 week.  She has noted fever up to 102 F, purulent cough with some scant hemoptysis, sore throat, diarrhea and abdominal discomfort for that time.  She was initially seen in the ED on 12/25 for the same at which point work-up showed leukocytosis and possible lung consolidation versus mass but negative Covid test and negative CT abdomen pelvis and she was discharged with doxycycline as she requested to go home and spend time with family for Christmas.  She has been taking doxycycline as prescribed without improvement.  Has had increased O2 requirements at home (has home O2 for COPD but has not needed for the past year).     ED Course: Afebrile, hypertensive, Placed on nasal cannula.  notable Labs: Sodium 132, K4.5, BUN 27, creatinine 1.44, WBC 5.3, Hb 12.5, lactic acid 0.8, COVID-19 and flu negative. Notable Imaging: CT chest with contrast-concern for left lower lobe pneumonia and new left greater than right pleural effusions with partial left lower lobe collapse, pulmonary arterial hypertension, aortic atherosclerosis and emphysema. Patient received Ceftriaxone and azithromycin and Tylenol   Assessment & Plan:   Principal Problem:   CAP (community acquired pneumonia) Active Problems:   Essential hypertension   Hyperlipidemia   Non-small cell carcinoma of right lung, stage 2 (HCC)   Chronic respiratory failure with hypoxia (HCC)   CKD (chronic kidney disease) stage 3, GFR 30-59 ml/min (HCC)   COPD with acute exacerbation (HCC)    Chronic diastolic CHF (congestive heart failure) ([place])  1 left-sided community-acquired pneumonia with underlying history of non-small cell carcinoma of the right lung status post right upper lobectomy, radiation and chemo Patient presented with worsening shortness of breath, wheezing, cough with no significant improvement on oral antibiotics.  Patient failed outpatient treatment.  CT chest done on admission consistent with left lower lobe pneumonia and new left > right pleural effusion and partial left lower lobe collapse. Clinical improvement. Afebrile. Leukocytosis trending down. Currently on 3 L nasal cannula with sats of 96%. Titrate O2 to keep sats 89-93%. Continue IV antibiotics of Rocephin and azithromycin through today and transition to oral antibiotics tomorrow. Continue Pulmicort, Flonase, Mucinex, scheduled duo nebs, Claritin. Incentive spirometry. Flutter valve. Ambulatory sats in the morning. Patient states prior to acute illness was not requiring oxygen at rest or on minimal exertion.  2.  Mild COPD exacerbation Likely triggered by problem #1. Improving clinically. Continue IV Solu-Medrol through today and transition to oral prednisone taper tomorrow. Continue Pulmicort, scheduled duo nebs, PPI, Flonase, Claritin. Incentive spirometry. Flutter valve. Continue empiric IV antibiotics. Follow.   3.  Bilateral pleural effusions L > R. Status post Lasix 40 mg IV x1 with urine output of 2.3 L over the past 24 hours. Clinical improvement. Follow.   4.  Chronic diastolic heart failure Not in acute exacerbation.  Status post Lasix 40 mg IV x1. Urine output of 2.3 L over the past 24 hours. Patient seems to be auto diuresing. Continue statin, Coreg, aspirin. Follow.  5.  Chronic respiratory failure secondary to COPD Patient noted  to have some wheezes on examination. Wheezing improved. Continue IV Solu-Medrol through today and transition to prednisone taper tomorrow. Continue Pulmicort, scheduled  duo nebs, incentive spirometry. Flutter valve.   6.  Anxiety Klonopin.   7.  Hypertension Continue to hold ARB. Continue Coreg.  8.  Hyperlipidemia Continue statin.   9.  Chronic kidney disease stage IIIa Stable.  At baseline.  Follow.   DVT prophylaxis: Heparin Code Status: Full Family Communication: Updated patient and daughter at bedside. Disposition:   Status is: Inpatient    Dispo: The patient is from: Home              Anticipated d/c is to: Home              Anticipated d/c date is: 1 to 2 days..              Patient currently on IV antibiotics, nebulizer treatments.  Not stable for discharge.       Consultants:   None  Procedures:   CT chest 11/10/2020   Antimicrobials:   IV Rocephin 11/10/2020>>>  IV azithromycin 11/10/2020>>>>   Subjective: Sitting up in chair. States she is feeling better. States wheezing improving. Still with rhonchorous cough. Asking whether she can get some chest PT and flutter valve. No chest pain. No abdominal pain. Patient states prior to onset of illness was not requiring oxygen at rest or on minimal ambulation.  Objective: Vitals:   11/11/20 2042 11/11/20 2138 11/12/20 0529 11/12/20 0902  BP:  (!) 147/84 (!) 158/80   Pulse:  86 93   Resp:  16 16   Temp:  98.5 F (36.9 C) 98.7 F (37.1 C)   TempSrc:  Oral Oral   SpO2: 96% 94% 95% 96%  Weight:      Height:        Intake/Output Summary (Last 24 hours) at 11/12/2020 1207 Last data filed at 11/12/2020 1100 Gross per 24 hour  Intake 790.22 ml  Output 2650 ml  Net -1859.78 ml   Filed Weights   11/10/20 1034  Weight: 83.9 kg    Examination:  General exam: NAD Respiratory system: Scattered coarse breath sounds.  Minimal expiratory wheezing.  No crackles.  Fair air movement.  Speaking in full sentences.  Normal respiratory effort.  Cardiovascular system: Regular rate rhythm no murmurs rubs or gallops. No JVD. No lower extremity edema. Gastrointestinal system:  Abdomen is soft, nontender, nondistended, positive bowel sounds. No rebound. No guarding. Central nervous system: Alert and oriented. No focal neurological deficits. Extremities: Symmetric 5 x 5 power. Skin: No rashes, lesions or ulcers Psychiatry: Judgement and insight appear normal. Mood & affect appropriate.     Data Reviewed: I have personally reviewed following labs and imaging studies  CBC: Recent Labs  Lab 11/10/20 1221 11/11/20 0301 11/12/20 0259  WBC 5.3 5.9 4.3  NEUTROABS 3.7  --   --   HGB 12.5 11.9* 11.4*  HCT 38.7 37.8 35.3*  MCV 92.6 94.3 92.2  PLT 344 332 756    Basic Metabolic Panel: Recent Labs  Lab 11/10/20 1221 11/11/20 0301 11/12/20 0259  NA 132* 132* 134*  K 4.5 4.8 5.0  CL 94* 90* 93*  CO2 27 33* 31  GLUCOSE 101* 104* 139*  BUN 27* 28* 35*  CREATININE 1.44* 1.55* 1.29*  CALCIUM 9.9 9.6 9.4    GFR: Estimated Creatinine Clearance: 37.9 mL/min (A) (by C-G formula based on SCr of 1.29 mg/dL (H)).  Liver Function Tests: Recent Labs  Lab 11/10/20 1221  AST 25  ALT 23  ALKPHOS 88  BILITOT 0.7  PROT 7.2  ALBUMIN 3.4*    CBG: No results for input(s): GLUCAP in the last 168 hours.   Recent Results (from the past 240 hour(s))  Blood culture (routine single)     Status: None   Collection Time: 11/05/20 10:50 AM   Specimen: BLOOD  Result Value Ref Range Status   Specimen Description   Final    BLOOD LEFT ANTECUBITAL Performed at [facility] [address] [place], [place] [address]    Special Requests   Final    BOTTLES DRAWN AEROBIC AND ANAEROBIC Blood Culture results may not be optimal due to an excessive volume of blood received in culture bottles Performed at [facility] [address], [place], [place] [address]    Culture   Final    NO GROWTH 5 DAYS Performed at [facility], [place] [address] [place], [place] [address]    Report Status 11/10/2020 FINAL  Final  Urine culture      Status: Abnormal   Collection Time: 11/05/20 11:03 AM   Specimen: In/Out Cath Urine  Result Value Ref Range Status   Specimen Description   Final    IN/OUT CATH URINE Performed at [facility] [address] [place], [place] [address]    Special Requests   Final    NONE Performed at [facility], [place] [address], [place], [place] [address]    Culture MULTIPLE SPECIES PRESENT, SUGGEST RECOLLECTION (A)  Final   Report Status 11/06/2020 FINAL  Final  Resp Panel by RT-PCR (Flu A&B, Covid) Nasopharyngeal Swab     Status: None   Collection Time: 11/05/20 11:22 AM   Specimen: Nasopharyngeal Swab; Nasopharyngeal(NP) swabs in vial transport medium  Result Value Ref Range Status   SARS Coronavirus 2 by RT PCR NEGATIVE NEGATIVE Final    Comment: (NOTE) SARS-CoV-2 target nucleic acids are NOT DETECTED.  The SARS-CoV-2 RNA is generally detectable in upper respiratory specimens during the acute phase of infection. The lowest concentration of SARS-CoV-2 viral copies this assay can detect is 138 copies/mL. A negative result does not preclude SARS-Cov-2 infection and should not be used as the sole basis for treatment or other patient management decisions. A negative result may occur with  improper specimen collection/handling, submission of specimen other than nasopharyngeal swab, presence of viral mutation(s) within the areas targeted by this assay, and inadequate number of viral copies(<138 copies/mL). A negative result must be combined with clinical observations, patient history, and epidemiological information. The expected result is Negative.  Fact Sheet for Patients:  EntrepreneurPulse.com.au  Fact Sheet for Healthcare Providers:  IncredibleEmployment.be  This test is no t yet approved or cleared by the [place] FDA and  has been authorized for detection and/or diagnosis of SARS-CoV-2 by FDA under an  Emergency Use Authorization (EUA). This EUA will remain  in effect (meaning this test can be used) for the duration of the COVID-19 declaration under Section 564(b)(1) of the Act, 21 U.S.C.section 360bbb-3(b)(1), unless the authorization is terminated  or revoked sooner.       Influenza A by PCR NEGATIVE NEGATIVE Final   Influenza B by PCR NEGATIVE NEGATIVE Final    Comment: (NOTE) The Xpert Xpress SARS-CoV-2/FLU/RSV plus assay is intended as an aid in the diagnosis of influenza from Nasopharyngeal swab specimens and should not be used as a sole basis for treatment. Nasal  washings and aspirates are unacceptable for Xpert Xpress SARS-CoV-2/FLU/RSV testing.  Fact Sheet for Patients: EntrepreneurPulse.com.au  Fact Sheet for Healthcare Providers: IncredibleEmployment.be  This test is not yet approved or cleared by the [place] FDA and has been authorized for detection and/or diagnosis of SARS-CoV-2 by FDA under an Emergency Use Authorization (EUA). This EUA will remain in effect (meaning this test can be used) for the duration of the COVID-19 declaration under Section 564(b)(1) of the Act, 21 U.S.C. section 360bbb-3(b)(1), unless the authorization is terminated or revoked.  Performed at [facility], [place] [address], [place], [place] [address]   Culture, blood (routine x 2)     Status: None (Preliminary result)   Collection Time: 11/10/20 12:21 PM   Specimen: BLOOD  Result Value Ref Range Status   Specimen Description   Final    BLOOD LEFT ANTECUBITAL Performed at [facility] [address] [place], [place] [address]    Special Requests   Final    BOTTLES DRAWN AEROBIC AND ANAEROBIC Blood Culture results may not be optimal due to an inadequate volume of blood received in culture bottles Performed at [facility] [address] [place], [place] [address]    Culture   Final     NO GROWTH 2 DAYS Performed at [facility] [address] [place], [place] [address]    Report Status PENDING  Incomplete  Culture, blood (routine x 2)     Status: None (Preliminary result)   Collection Time: 11/10/20 12:21 PM   Specimen: BLOOD  Result Value Ref Range Status   Specimen Description   Final    BLOOD RIGHT ANTECUBITAL Performed at [facility] [address], [place], [place] [address]    Special Requests   Final    BOTTLES DRAWN AEROBIC AND ANAEROBIC Blood Culture adequate volume Performed at [facility] [address], [place], [place] [address]    Culture   Final    NO GROWTH 2 DAYS Performed at [facility] [address] [place], [place] [address]    Report Status PENDING  Incomplete  Resp Panel by RT-PCR (Flu A&B, Covid) Nasopharyngeal Swab     Status: None   Collection Time: 11/10/20 12:32 PM   Specimen: Nasopharyngeal Swab; Nasopharyngeal(NP) swabs in vial transport medium  Result Value Ref Range Status   SARS Coronavirus 2 by RT PCR NEGATIVE NEGATIVE Final    Comment: (NOTE) SARS-CoV-2 target nucleic acids are NOT DETECTED.  The SARS-CoV-2 RNA is generally detectable in upper respiratory specimens during the acute phase of infection. The lowest concentration of SARS-CoV-2 viral copies this assay can detect is 138 copies/mL. A negative result does not preclude SARS-Cov-2 infection and should not be used as the sole basis for treatment or other patient management decisions. A negative result may occur with  improper specimen collection/handling, submission of specimen other than nasopharyngeal swab, presence of viral mutation(s) within the areas targeted by this assay, and inadequate number of viral copies(<138 copies/mL). A negative result must be combined with clinical observations, patient history, and epidemiological information. The expected result is Negative.  Fact Sheet for Patients:   EntrepreneurPulse.com.au  Fact Sheet for Healthcare Providers:  IncredibleEmployment.be  This test is no t yet approved or cleared by the [place] FDA and  has been authorized for detection and/or diagnosis of SARS-CoV-2 by FDA under an Emergency Use Authorization (EUA). This EUA will remain  in effect (  meaning this test can be used) for the duration of the COVID-19 declaration under Section 564(b)(1) of the Act, 21 U.S.C.section 360bbb-3(b)(1), unless the authorization is terminated  or revoked sooner.       Influenza A by PCR NEGATIVE NEGATIVE Final   Influenza B by PCR NEGATIVE NEGATIVE Final    Comment: (NOTE) The Xpert Xpress SARS-CoV-2/FLU/RSV plus assay is intended as an aid in the diagnosis of influenza from Nasopharyngeal swab specimens and should not be used as a sole basis for treatment. Nasal washings and aspirates are unacceptable for Xpert Xpress SARS-CoV-2/FLU/RSV testing.  Fact Sheet for Patients: EntrepreneurPulse.com.au  Fact Sheet for Healthcare Providers: IncredibleEmployment.be  This test is not yet approved or cleared by the [place] FDA and has been authorized for detection and/or diagnosis of SARS-CoV-2 by FDA under an Emergency Use Authorization (EUA). This EUA will remain in effect (meaning this test can be used) for the duration of the COVID-19 declaration under Section 564(b)(1) of the Act, 21 U.S.C. section 360bbb-3(b)(1), unless the authorization is terminated or revoked.  Performed at [facility], [place] [address] [place], [place] [address]   Group A Strep by PCR     Status: None   Collection Time: 11/10/20 12:32 PM   Specimen: Throat; Sterile Swab  Result Value Ref Range Status   Group A Strep by PCR NOT DETECTED NOT DETECTED Final    Comment: Performed at [facility], [place] [address], [place], [place] [address]  MRSA  PCR Screening     Status: None   Collection Time: 11/10/20  6:50 PM   Specimen: Nasal Mucosa; Nasopharyngeal  Result Value Ref Range Status   MRSA by PCR NEGATIVE NEGATIVE Final    Comment:        The GeneXpert MRSA Assay (FDA approved for NASAL specimens only), is one component of a comprehensive MRSA colonization surveillance program. It is not intended to diagnose MRSA infection nor to guide or monitor treatment for MRSA infections. Performed at [facility], [place] [address], [place], [place] [address]   Sputum culture     Status: None   Collection Time: 11/10/20 11:00 PM   Specimen: Expectorated Sputum  Result Value Ref Range Status   Specimen Description EXPECTORATED SPUTUM  Final   Special Requests NONE  Final   Sputum evaluation   Final    THIS SPECIMEN IS ACCEPTABLE FOR SPUTUM CULTURE Performed at [facility], [place] [address], [place], [place] [address]    Report Status 11/10/2020 FINAL  Final  Culture, respiratory     Status: None (Preliminary result)   Collection Time: 11/10/20 11:00 PM  Result Value Ref Range Status   Specimen Description   Final    EXPECTORATED SPUTUM Performed at [facility], [place] [address], [place], [place] [address]    Special Requests   Final    NONE Reflexed from [id number] Performed at [facility], [place] [address], [place], [place] [address]    Gram Stain   Final    NO WBC SEEN RARE GRAM POSITIVE COCCI IN PAIRS RARE GRAM POSITIVE RODS    Culture   Final    CULTURE REINCUBATED FOR BETTER GROWTH Performed at [facility], [place] [address], [place], [place] [address]    Report Status PENDING  Incomplete         Radiology Studies: CT Chest W Contrast  Result Date: 11/10/2020 CLINICAL DATA:  Non-small cell lung cancer.  Cough with hemoptysis and shortness of breath. Reported recent negative COVID testing. EXAM: CT CHEST WITH CONTRAST TECHNIQUE:  Multidetector CT imaging of the chest was performed during intravenous contrast administration. CONTRAST:  67mL OMNIPAQUE IOHEXOL 300 MG/ML  SOLN COMPARISON:  Abdominal CT and chest radiographs 11/05/2020. Chest CT 05/30/2020 and 12/01/2019 FINDINGS: Cardiovascular: Atherosclerosis of the aorta, great vessels and coronary arteries. There is central enlargement of the pulmonary arteries consistent with pulmonary arterial hypertension. The heart size is stable. No significant pericardial fluid. Mediastinum/Nodes: There are no enlarged mediastinal, hilar or axillary lymph nodes. The thyroid gland, trachea and esophagus demonstrate no significant findings. Lungs/Pleura: Moderate centrilobular and paraseptal emphysema with stable postsurgical changes from previous right upper lobe resection. There are stable paramediastinal radiation changes on the right. Compared with the recent abdominal CT, there are new trace and small left pleural effusions. There is associated increased compressive atelectasis and opacity within the left lower lobe obscuring the previously demonstrated nodular density. In addition, there are patchy airspace opacities with air bronchograms throughout the lingula, likely reflecting pneumonia. These were seen on recent chest radiographs. There is a somewhat nodular component measuring up to 2.8 x 2.2 cm on image 62/7, containing an air bronchogram. No suspicious right lung nodules. Upper abdomen: The visualized upper abdomen appears stable without significant findings post left nephrectomy. Musculoskeletal/Chest wall: There is no chest wall mass or suspicious osseous finding. Previous C6-7 ACDF. Stable compression deformities at T3, T4 and T6 with post spinal augmentation changes at T3 and T4. IMPRESSION: 1. New patchy airspace opacities with air bronchograms throughout the lingula and left lower lobe, likely reflecting pneumonia. Radiographic follow-up necessary to document resolution. 2. Compared  with recent abdominal CT, new left-greater-than-right pleural effusions with associated partial left lower lobe collapse, obscuring a previously demonstrated nodular left lower lobe density. 3. Stable paramediastinal radiation changes on the right. No evidence of local recurrence or metastatic disease. 4. Central enlargement of the pulmonary arteries consistent with pulmonary arterial hypertension. 5. Aortic Atherosclerosis (ICD10-I70.0) and Emphysema (ICD10-J43.9). Electronically Signed   By: Richardean Sale M.D.   On: 11/10/2020 14:54        Scheduled Meds: . vitamin C  1,000 mg Oral Daily  . aspirin EC  81 mg Oral Daily  . atorvastatin  40 mg Oral Daily  . azithromycin  500 mg Oral q1600  . budesonide (PULMICORT) nebulizer solution  0.5 mg Nebulization BID  . carvedilol  12.5 mg Oral BID  . fluticasone  2 spray Each Nare Daily  . guaiFENesin  1,200 mg Oral BID  . heparin  5,000 Units Subcutaneous Q8H  . ipratropium-albuterol  3 mL Nebulization BID  . loratadine  10 mg Oral Daily  . methylPREDNISolone (SOLU-MEDROL) injection  60 mg Intravenous Daily  . omega-3 acid ethyl esters  1 capsule Oral Daily  . pantoprazole  40 mg Oral Q0600   Continuous Infusions: . cefTRIAXone (ROCEPHIN)  IV 2 g (11/11/20 1543)     LOS: 2 days    Time spent: 45 minutes    Irine Seal, MD [facility]   To contact the attending provider between 7A-7P or the covering provider during after hours 7P-7A, please log into the web site [email or web address] and access using universal [place] password for that web site. If you do not have the password, please call the hospital operator.  11/12/2020, 12:07 PM

## 2020-11-13 DIAGNOSIS — C3491 Malignant neoplasm of unspecified part of right bronchus or lung: Secondary | ICD-10-CM | POA: Diagnosis not present

## 2020-11-13 DIAGNOSIS — J189 Pneumonia, unspecified organism: Secondary | ICD-10-CM | POA: Diagnosis not present

## 2020-11-13 DIAGNOSIS — N1831 Chronic kidney disease, stage 3a: Secondary | ICD-10-CM | POA: Diagnosis not present

## 2020-11-13 LAB — BASIC METABOLIC PANEL
Anion gap: 11 (ref 5–15)
BUN: 40 mg/dL — ABNORMAL HIGH (ref 8–23)
CO2: 30 mmol/L (ref 22–32)
Calcium: 9.7 mg/dL (ref 8.9–10.3)
Chloride: 96 mmol/L — ABNORMAL LOW (ref 98–111)
Creatinine, Ser: 1.25 mg/dL — ABNORMAL HIGH (ref 0.44–1.00)
GFR, Estimated: 44 mL/min — ABNORMAL LOW (ref >60)
Glucose, Bld: 132 mg/dL — ABNORMAL HIGH (ref 70–99)
Potassium: 5.2 mmol/L — ABNORMAL HIGH (ref 3.5–5.1)
Sodium: 137 mmol/L (ref 135–145)

## 2020-11-13 LAB — CBC
HCT: 34.3 % — ABNORMAL LOW (ref 36.0–46.0)
Hemoglobin: 11.2 g/dL — ABNORMAL LOW (ref 12.0–15.0)
MCH: 29.8 pg (ref 26.0–34.0)
MCHC: 32.7 g/dL (ref 30.0–36.0)
MCV: 91.2 fL (ref 80.0–100.0)
Platelets: 425 10*3/uL — ABNORMAL HIGH (ref 150–400)
RBC: 3.76 MIL/uL — ABNORMAL LOW (ref 3.87–5.11)
RDW: 16 % — ABNORMAL HIGH (ref 11.5–15.5)
WBC: 8.1 10*3/uL (ref 4.0–10.5)
nRBC: 0 % (ref 0.0–0.2)

## 2020-11-13 LAB — LEGIONELLA PNEUMOPHILA SEROGP 1 UR AG: L. pneumophila Serogp 1 Ur Ag: NEGATIVE

## 2020-11-13 LAB — CULTURE, RESPIRATORY W GRAM STAIN
Culture: NORMAL
Gram Stain: NONE SEEN

## 2020-11-13 NOTE — Plan of Care (Signed)
  Problem: Education: Goal: Knowledge of General Education information will improve Description: Including pain rating scale, medication(s)/side effects and non-pharmacologic comfort measures Outcome: Progressing   Problem: Health Behavior/Discharge Planning: Goal: Ability to manage health-related needs will improve Outcome: Progressing   Problem: Activity: Goal: Risk for activity intolerance will decrease Outcome: Progressing   

## 2020-11-13 NOTE — Plan of Care (Signed)
°  Problem: Education: °Goal: Knowledge of General Education information will improve °Description: Including pain rating scale, medication(s)/side effects and non-pharmacologic comfort measures °Outcome: Progressing °  °Problem: Clinical Measurements: °Goal: Diagnostic test results will improve °Outcome: Progressing °  °Problem: Clinical Measurements: °Goal: Respiratory complications will improve °Outcome: Progressing °  °

## 2020-11-13 NOTE — Evaluation (Signed)
Occupational Therapy Evaluation Patient Details Name: AVIONNA BOWER MRN: [id number] DOB: [birth year] Today's Date: 11/13/2020    History of Present Illness 80 yo female admitted with Pna, weakness. Hx of COPE, depression, glaucoma, lung ca s/p lobectomy/radiation/chemo, anxiety, L nephrectomy   Clinical Impression   Mrs. Stephaie Dardis is a 46 year old woman who presents on 3 L [place] supine in bed. ON evaluation patient demonstrates ability to perform bed transfers, ambulation with RW and all ADLs. Patient's o2 sat dropped to 88% with activity on [place] but recovered quickly. Patient has no OT needs at this time.    Follow Up Recommendations  No OT follow up    Equipment Recommendations  None recommended by OT    Recommendations for Other Services       Precautions / Restrictions Precautions Precaution Comments: monitor O2 Restrictions Weight Bearing Restrictions: No      Mobility Bed Mobility Overal bed mobility: Modified Independent             General bed mobility comments: use of bed rail    Transfers Overall transfer level: Needs assistance Equipment used: Rolling walker (2 wheeled) Transfers: Sit to/from Stand;Stand Pivot Transfers Sit to Stand: Supervision Stand pivot transfers: Supervision       General transfer comment: Supervision for ambulation in room with RW. Patient on 3L [place]. O2 sat dropped to 88% on [place] with activity but recovered quickly with seated rest break.    Balance Overall balance assessment: Mild deficits observed, not formally tested                                         ADL either performed or assessed with clinical judgement   ADL                                         General ADL Comments: Supervision for all ADLs during evaluation. Demonstrates ability to perform toileting, standing at sink for prolonged grooming task, managing clothing and donning socks.     Vision Patient Visual Report: No change  from baseline       Perception     Praxis      Pertinent Vitals/Pain Pain Assessment: No/denies pain     Hand Dominance     Extremity/Trunk Assessment Upper Extremity Assessment Upper Extremity Assessment: Overall WFL for tasks assessed   Lower Extremity Assessment Lower Extremity Assessment: Defer to PT evaluation   Cervical / Trunk Assessment Cervical / Trunk Assessment: Normal   Communication Communication Communication: No difficulties   Cognition Arousal/Alertness: Awake/alert Behavior During Therapy: WFL for tasks assessed/performed Overall Cognitive Status: Within Functional Limits for tasks assessed                                     General Comments       Exercises     Shoulder Instructions      Home Living Family/patient expects to be discharged to:: Private residence Living Arrangements: Alone Available Help at Discharge: Family Type of Home: House Home Access: Stairs to enter Technical brewer of Steps: 2 Entrance Stairs-Rails: Right Home Layout: Able to live on main level with bedroom/bathroom;Two level     Bathroom Shower/Tub: Tub/shower unit  Home Equipment: [place] - 2 wheels;Bedside commode;Shower seat          Prior Functioning/Environment Level of Independence: Independent        Comments: still working up until this illness/hospital admission        OT Problem List: Cardiopulmonary status limiting activity      OT Treatment/Interventions:      OT Goals(Current goals can be found in the care plan section) Acute Rehab OT Goals OT Goal Formulation: All assessment and education complete, DC therapy  OT Frequency:     Barriers to D/C:            Co-evaluation              AM-PAC OT "6 Clicks" Daily Activity     Outcome Measure Help from another person eating meals?: None Help from another person taking care of personal grooming?: None Help from another person toileting, which  includes using toliet, bedpan, or urinal?: None Help from another person bathing (including washing, rinsing, drying)?: None Help from another person to put on and taking off regular upper body clothing?: None Help from another person to put on and taking off regular lower body clothing?: None 6 Click Score: 24   End of Session Equipment Utilized During Treatment: Gait belt;Rolling walker;Oxygen Nurse Communication: Mobility status  Activity Tolerance: Patient tolerated treatment well Patient left: in chair;with call bell/phone within reach  OT Visit Diagnosis: Muscle weakness (generalized) (M62.81)                Time: 0626-9485 OT Time Calculation (min): 18 min Charges:  OT General Charges $OT Visit: 1 Visit OT Evaluation $OT Eval Low Complexity: 1 Low  Eshani Maestre, OTR/L [place]  Office [phone] Pager: [place] 11/13/2020, 11:03 AM

## 2020-11-13 NOTE — Progress Notes (Signed)
RT unable to do CPT

## 2020-11-13 NOTE — Progress Notes (Signed)
PROGRESS NOTE    CAMBRIE SONNENFELD  VOH:[id number] DOB: May 21, 1941 DOA: 11/10/2020 PCP: Forrest Moron, MD    Chief Complaint  Patient presents with  . Fatigue    Brief Narrative:  HPI per Dr. [name] This is a 80 year old female with past medical history of COPD, HFpEF, hypertension, hyperlipidemia, non-small cell carcinoma of the right lung s/p right upper lobectomy, radiation and chemo, RCC s/p left nephrectomy who presented to the ED with flulike symptoms x1 week.  She has noted fever up to 102 F, purulent cough with some scant hemoptysis, sore throat, diarrhea and abdominal discomfort for that time.  She was initially seen in the ED on 12/25 for the same at which point work-up showed leukocytosis and possible lung consolidation versus mass but negative Covid test and negative CT abdomen pelvis and she was discharged with doxycycline as she requested to go home and spend time with family for Christmas.  She has been taking doxycycline as prescribed without improvement.  Has had increased O2 requirements at home (has home O2 for COPD but has not needed for the past year).  CT chest with contrast-concern for left lower lobe pneumonia and new left greater than right pleural effusions with partial left lower lobe collapse, pulmonary arterial hypertension, aortic atherosclerosis and emphysema. Patient received Ceftriaxone and azithromycin and Tylenol   Assessment & Plan:   Principal Problem:   CAP (community acquired pneumonia) Active Problems:   Essential hypertension   Hyperlipidemia   Non-small cell carcinoma of right lung, stage 2 (HCC)   Chronic respiratory failure with hypoxia (HCC)   CKD (chronic kidney disease) stage 3, GFR 30-59 ml/min (HCC)   COPD with acute exacerbation (HCC)   Chronic diastolic CHF (congestive heart failure) ([place])   left-sided community-acquired pneumonia with underlying history of non-small cell carcinoma of the right lung status post right upper lobectomy,  radiation and chemo Patient presented with worsening shortness of breath, wheezing, cough with no significant improvement on oral antibiotics.  Patient failed outpatient treatment.  CT chest done on admission consistent with left lower lobe pneumonia and new left > right pleural effusion and partial left lower lobe collapse. Clinical improvement. Afebrile. Leukocytosis trending down. Currently on 3 L nasal cannula with sats of 96%. Titrate O2 to keep sats 89-93%.  - Continue Pulmicort, Flonase, Mucinex, scheduled duo nebs, Claritin. Incentive spirometry. Flutter valve.  -has O2 at home but will get ambulatory O2 sats  Mild COPD exacerbation Likely triggered by problem #1. Improving clinically. -oral prednisone taper -Continue Pulmicort, scheduled duo nebs, PPI, Flonase, Claritin. Incentive spirometry. Flutter valve.  -Continue empiric IV antibiotics. Follow sputum culture  Bilateral pleural effusions L > R. Status post Lasix 40 mg IV x1 with urine output of 2.6 L negative thus far   Chronic diastolic heart failure Not in acute exacerbation.  Status post Lasix 40 mg IV x1. Urine output of 2.3 L over the past 24 hours. Patient seems to be auto diuresing  Chronic respiratory failure secondary to COPD - transition to prednisone taper  -Continue Pulmicort, scheduled duo nebs, incentive spirometry. Flutter valve.  -mobilize   Anxiety Klonopin.    Hypertension Continue Coreg.  Hyperlipidemia Continue statin.   Chronic kidney disease stage IIIa Stable.  At baseline.  Follow.  hyperkalemia -recheck in AM, not on any supplements  DVT prophylaxis: Heparin Code Status: Full Disposition:   Status is: Inpatient    Dispo: The patient is from: Home  Anticipated d/c is to: Home              Anticipated d/c date is:in AM with home O2 most like.              Patient currently on IV antibiotics, nebulizer treatments.  Not stable for discharge-- needs to mobilize more  today       Consultants:   None  Procedures:   CT chest 11/10/2020   Antimicrobials:   IV Rocephin 11/10/2020>>>  IV azithromycin 11/10/2020>>>>   Subjective: Sitting up in chair. States she is feeling better. States wheezing improving. Still with rhonchorous cough. Asking whether she can get some chest PT and flutter valve. No chest pain. No abdominal pain. Patient states prior to onset of illness was not requiring oxygen at rest or on minimal ambulation.  Objective: Vitals:   11/12/20 1814 11/12/20 2116 11/13/20 0625 11/13/20 0844  BP:  (!) 147/75 (!) 149/75   Pulse:  79 79   Resp:  18 18   Temp:  99.1 F (37.3 C) 98.7 F (37.1 C)   TempSrc:  Oral Oral   SpO2: 96% 96% 96% 96%  Weight:   86.6 kg   Height:        Intake/Output Summary (Last 24 hours) at 11/13/2020 1052 Last data filed at 11/13/2020 0828 Gross per 24 hour  Intake 1070.32 ml  Output 2500 ml  Net -1429.68 ml   Filed Weights   11/10/20 1034 11/12/20 1156 11/13/20 0625  Weight: 83.9 kg 85 kg 86.6 kg    Examination:  General exam: NAD Respiratory system: Scattered coarse breath sounds.  Minimal expiratory wheezing.  No crackles.  Fair air movement.  Speaking in full sentences.  Normal respiratory effort.  Cardiovascular system: Regular rate rhythm no murmurs rubs or gallops. No JVD. No lower extremity edema. Gastrointestinal system: Abdomen is soft, nontender, nondistended, positive bowel sounds. No rebound. No guarding. Central nervous system: Alert and oriented. No focal neurological deficits. Extremities: Symmetric 5 x 5 power. Skin: No rashes, lesions or ulcers Psychiatry: Judgement and insight appear normal. Mood & affect appropriate.     Data Reviewed: I have personally reviewed following labs and imaging studies  CBC: Recent Labs  Lab 11/10/20 1221 11/11/20 0301 11/12/20 0259 11/13/20 0256  WBC 5.3 5.9 4.3 8.1  NEUTROABS 3.7  --   --   --   HGB 12.5 11.9* 11.4* 11.2*  HCT 38.7  37.8 35.3* 34.3*  MCV 92.6 94.3 92.2 91.2  PLT 344 332 392 425*    Basic Metabolic Panel: Recent Labs  Lab 11/10/20 1221 11/11/20 0301 11/12/20 0259 11/13/20 0256  NA 132* 132* 134* 137  K 4.5 4.8 5.0 5.2*  CL 94* 90* 93* 96*  CO2 27 33* 31 30  GLUCOSE 101* 104* 139* 132*  BUN 27* 28* 35* 40*  CREATININE 1.44* 1.55* 1.29* 1.25*  CALCIUM 9.9 9.6 9.4 9.7    GFR: Estimated Creatinine Clearance: 39.7 mL/min (A) (by C-G formula based on SCr of 1.25 mg/dL (H)).  Liver Function Tests: Recent Labs  Lab 11/10/20 1221  AST 25  ALT 23  ALKPHOS 88  BILITOT 0.7  PROT 7.2  ALBUMIN 3.4*    CBG: No results for input(s): GLUCAP in the last 168 hours.   Recent Results (from the past 240 hour(s))  Blood culture (routine single)     Status: None   Collection Time: 11/05/20 10:50 AM   Specimen: BLOOD  Result Value Ref Range Status  Specimen Description   Final    BLOOD LEFT ANTECUBITAL Performed at [facility] [address], [place], [place] [address]    Special Requests   Final    BOTTLES DRAWN AEROBIC AND ANAEROBIC Blood Culture results may not be optimal due to an excessive volume of blood received in culture bottles Performed at [facility] [address] [place], [place] [address]    Culture   Final    NO GROWTH 5 DAYS Performed at [facility], [place] [address], [place], [place] [address]    Report Status 11/10/2020 FINAL  Final  Urine culture     Status: Abnormal   Collection Time: 11/05/20 11:03 AM   Specimen: In/Out Cath Urine  Result Value Ref Range Status   Specimen Description   Final    IN/OUT CATH URINE Performed at [facility] [address], [place], [place] [address]    Special Requests   Final    NONE Performed at [facility], [place] [address] [place], [place] [address]    Culture MULTIPLE SPECIES PRESENT, SUGGEST RECOLLECTION (A)  Final   Report Status  11/06/2020 FINAL  Final  Resp Panel by RT-PCR (Flu A&B, Covid) Nasopharyngeal Swab     Status: None   Collection Time: 11/05/20 11:22 AM   Specimen: Nasopharyngeal Swab; Nasopharyngeal(NP) swabs in vial transport medium  Result Value Ref Range Status   SARS Coronavirus 2 by RT PCR NEGATIVE NEGATIVE Final    Comment: (NOTE) SARS-CoV-2 target nucleic acids are NOT DETECTED.  The SARS-CoV-2 RNA is generally detectable in upper respiratory specimens during the acute phase of infection. The lowest concentration of SARS-CoV-2 viral copies this assay can detect is 138 copies/mL. A negative result does not preclude SARS-Cov-2 infection and should not be used as the sole basis for treatment or other patient management decisions. A negative result may occur with  improper specimen collection/handling, submission of specimen other than nasopharyngeal swab, presence of viral mutation(s) within the areas targeted by this assay, and inadequate number of viral copies(<138 copies/mL). A negative result must be combined with clinical observations, patient history, and epidemiological information. The expected result is Negative.  Fact Sheet for Patients:  EntrepreneurPulse.com.au  Fact Sheet for Healthcare Providers:  IncredibleEmployment.be  This test is no t yet approved or cleared by the [place] FDA and  has been authorized for detection and/or diagnosis of SARS-CoV-2 by FDA under an Emergency Use Authorization (EUA). This EUA will remain  in effect (meaning this test can be used) for the duration of the COVID-19 declaration under Section 564(b)(1) of the Act, 21 U.S.C.section 360bbb-3(b)(1), unless the authorization is terminated  or revoked sooner.       Influenza A by PCR NEGATIVE NEGATIVE Final   Influenza B by PCR NEGATIVE NEGATIVE Final    Comment: (NOTE) The Xpert Xpress SARS-CoV-2/FLU/RSV plus assay is intended as an aid in the diagnosis of  influenza from Nasopharyngeal swab specimens and should not be used as a sole basis for treatment. Nasal washings and aspirates are unacceptable for Xpert Xpress SARS-CoV-2/FLU/RSV testing.  Fact Sheet for Patients: EntrepreneurPulse.com.au  Fact Sheet for Healthcare Providers: IncredibleEmployment.be  This test is not yet approved or cleared by the [place] FDA and has been authorized for detection and/or diagnosis of SARS-CoV-2 by FDA under an Emergency Use Authorization (EUA). This EUA will remain in effect (meaning this test can be used) for the duration of the COVID-19 declaration  under Section 564(b)(1) of the Act, 21 U.S.C. section 360bbb-3(b)(1), unless the authorization is terminated or revoked.  Performed at [facility], [place] [address] [place], [place] [address]   Culture, blood (routine x 2)     Status: None (Preliminary result)   Collection Time: 11/10/20 12:21 PM   Specimen: BLOOD  Result Value Ref Range Status   Specimen Description   Final    BLOOD LEFT ANTECUBITAL Performed at [facility] [address] [place], [place] [address]    Special Requests   Final    BOTTLES DRAWN AEROBIC AND ANAEROBIC Blood Culture results may not be optimal due to an inadequate volume of blood received in culture bottles Performed at [facility] [address] [place], [place] [address]    Culture   Final    NO GROWTH 3 DAYS Performed at [facility], [place] [address] [place], [place] [address]    Report Status PENDING  Incomplete  Culture, blood (routine x 2)     Status: None (Preliminary result)   Collection Time: 11/10/20 12:21 PM   Specimen: BLOOD  Result Value Ref Range Status   Specimen Description   Final    BLOOD RIGHT ANTECUBITAL Performed at [facility] [address], [place], [place] [address]    Special Requests   Final    BOTTLES  DRAWN AEROBIC AND ANAEROBIC Blood Culture adequate volume Performed at [facility] [address] [place], [place] [address]    Culture   Final    NO GROWTH 3 DAYS Performed at [facility], [place] [address] [place], [place] [address]    Report Status PENDING  Incomplete  Resp Panel by RT-PCR (Flu A&B, Covid) Nasopharyngeal Swab     Status: None   Collection Time: 11/10/20 12:32 PM   Specimen: Nasopharyngeal Swab; Nasopharyngeal(NP) swabs in vial transport medium  Result Value Ref Range Status   SARS Coronavirus 2 by RT PCR NEGATIVE NEGATIVE Final    Comment: (NOTE) SARS-CoV-2 target nucleic acids are NOT DETECTED.  The SARS-CoV-2 RNA is generally detectable in upper respiratory specimens during the acute phase of infection. The lowest concentration of SARS-CoV-2 viral copies this assay can detect is 138 copies/mL. A negative result does not preclude SARS-Cov-2 infection and should not be used as the sole basis for treatment or other patient management decisions. A negative result may occur with  improper specimen collection/handling, submission of specimen other than nasopharyngeal swab, presence of viral mutation(s) within the areas targeted by this assay, and inadequate number of viral copies(<138 copies/mL). A negative result must be combined with clinical observations, patient history, and epidemiological information. The expected result is Negative.  Fact Sheet for Patients:  EntrepreneurPulse.com.au  Fact Sheet for Healthcare Providers:  IncredibleEmployment.be  This test is no t yet approved or cleared by the [place] FDA and  has been authorized for detection and/or diagnosis of SARS-CoV-2 by FDA under an Emergency Use Authorization (EUA). This EUA will remain  in effect (meaning this test can be used) for the duration of the COVID-19 declaration under Section 564(b)(1) of the Act, 21 U.S.C.section  360bbb-3(b)(1), unless the authorization is terminated  or revoked sooner.       Influenza A by PCR NEGATIVE NEGATIVE Final   Influenza B by PCR NEGATIVE NEGATIVE Final    Comment: (NOTE) The Xpert Xpress SARS-CoV-2/FLU/RSV plus assay is intended as an aid in the diagnosis of influenza  from Nasopharyngeal swab specimens and should not be used as a sole basis for treatment. Nasal washings and aspirates are unacceptable for Xpert Xpress SARS-CoV-2/FLU/RSV testing.  Fact Sheet for Patients: EntrepreneurPulse.com.au  Fact Sheet for Healthcare Providers: IncredibleEmployment.be  This test is not yet approved or cleared by the [place] FDA and has been authorized for detection and/or diagnosis of SARS-CoV-2 by FDA under an Emergency Use Authorization (EUA). This EUA will remain in effect (meaning this test can be used) for the duration of the COVID-19 declaration under Section 564(b)(1) of the Act, 21 U.S.C. section 360bbb-3(b)(1), unless the authorization is terminated or revoked.  Performed at [facility], [place] [address], [place], [place] [address]   Group A Strep by PCR     Status: None   Collection Time: 11/10/20 12:32 PM   Specimen: Throat; Sterile Swab  Result Value Ref Range Status   Group A Strep by PCR NOT DETECTED NOT DETECTED Final    Comment: Performed at [facility], [place] [address] [place], [place] [address]  MRSA PCR Screening     Status: None   Collection Time: 11/10/20  6:50 PM   Specimen: Nasal Mucosa; Nasopharyngeal  Result Value Ref Range Status   MRSA by PCR NEGATIVE NEGATIVE Final    Comment:        The GeneXpert MRSA Assay (FDA approved for NASAL specimens only), is one component of a comprehensive MRSA colonization surveillance program. It is not intended to diagnose MRSA infection nor to guide or monitor treatment for MRSA infections. Performed at [facility], [place] [address] [place], [place] [address]   Sputum culture     Status: None   Collection Time: 11/10/20 11:00 PM   Specimen: Expectorated Sputum  Result Value Ref Range Status   Specimen Description EXPECTORATED SPUTUM  Final   Special Requests NONE  Final   Sputum evaluation   Final    THIS SPECIMEN IS ACCEPTABLE FOR SPUTUM CULTURE Performed at [facility], [place] [address] [place], [place] [address]    Report Status 11/10/2020 FINAL  Final  Culture, respiratory     Status: None   Collection Time: 11/10/20 11:00 PM  Result Value Ref Range Status   Specimen Description   Final    EXPECTORATED SPUTUM Performed at [facility] [address], [place], [place] [address]    Special Requests   Final    NONE Reflexed from [phone] Performed at [facility], [place] [address] [place], [place] [address]    Gram Stain   Final    NO WBC SEEN RARE GRAM POSITIVE COCCI IN PAIRS RARE GRAM POSITIVE RODS    Culture   Final    RARE Normal respiratory flora-no Staph aureus or Pseudomonas seen Performed at [facility], [address], [place], [place] [address]    Report Status 11/13/2020 FINAL  Final  Culture, Urine     Status: None   Collection Time: 11/11/20  8:27 AM   Specimen: Urine, Clean Catch  Result Value Ref Range Status   Specimen Description   Final    Urine Performed at [facility] [address] [place], [place] [address]    Special Requests   Final    NONE Performed at [facility], [place] [address], [place], [place] [address]    Culture   Final    NO GROWTH Performed at [facility],  [address] [place], [place] [address]    Report Status 11/12/2020 FINAL  Final         Radiology Studies: No results found.      Scheduled Meds: . vitamin C  1,000 mg Oral Daily  . aspirin EC  81 mg Oral Daily  . atorvastatin  40 mg Oral Daily   . azithromycin  500 mg Oral q1600  . budesonide (PULMICORT) nebulizer solution  0.5 mg Nebulization BID  . carvedilol  12.5 mg Oral BID  . cefdinir  300 mg Oral Q12H  . fluticasone  2 spray Each Nare Daily  . guaiFENesin  1,200 mg Oral BID  . heparin  5,000 Units Subcutaneous Q8H  . ipratropium-albuterol  3 mL Nebulization BID  . loratadine  10 mg Oral Daily  . omega-3 acid ethyl esters  1 capsule Oral Daily  . pantoprazole  40 mg Oral Q0600  . predniSONE  40 mg Oral QAC breakfast   Continuous Infusions:    LOS: 3 days    Time spent: 25 minutes    Geradine Girt, DO [facility]   To contact the attending provider between 7A-7P or the covering provider during after hours 7P-7A, please log into the web site [email or web address] and access using universal [place] password for that web site. If you do not have the password, please call the hospital operator.  11/13/2020, 10:52 AM

## 2020-11-14 DIAGNOSIS — I5032 Chronic diastolic (congestive) heart failure: Secondary | ICD-10-CM | POA: Diagnosis not present

## 2020-11-14 DIAGNOSIS — J441 Chronic obstructive pulmonary disease with (acute) exacerbation: Secondary | ICD-10-CM | POA: Diagnosis not present

## 2020-11-14 DIAGNOSIS — J189 Pneumonia, unspecified organism: Secondary | ICD-10-CM | POA: Diagnosis not present

## 2020-11-14 LAB — BASIC METABOLIC PANEL
Anion gap: 11 (ref 5–15)
BUN: 41 mg/dL — ABNORMAL HIGH (ref 8–23)
CO2: 32 mmol/L (ref 22–32)
Calcium: 9.9 mg/dL (ref 8.9–10.3)
Chloride: 96 mmol/L — ABNORMAL LOW (ref 98–111)
Creatinine, Ser: 1.12 mg/dL — ABNORMAL HIGH (ref 0.44–1.00)
GFR, Estimated: 51 mL/min — ABNORMAL LOW (ref >60)
Glucose, Bld: 124 mg/dL — ABNORMAL HIGH (ref 70–99)
Potassium: 5 mmol/L (ref 3.5–5.1)
Sodium: 139 mmol/L (ref 135–145)

## 2020-11-14 LAB — CBC
HCT: 38.4 % (ref 36.0–46.0)
Hemoglobin: 12.3 g/dL (ref 12.0–15.0)
MCH: 30 pg (ref 26.0–34.0)
MCHC: 32 g/dL (ref 30.0–36.0)
MCV: 93.7 fL (ref 80.0–100.0)
Platelets: 445 10*3/uL — ABNORMAL HIGH (ref 150–400)
RBC: 4.1 MIL/uL (ref 3.87–5.11)
RDW: 16.5 % — ABNORMAL HIGH (ref 11.5–15.5)
WBC: 9.1 10*3/uL (ref 4.0–10.5)
nRBC: 0 % (ref 0.0–0.2)

## 2020-11-14 MED ORDER — PREDNISONE 20 MG PO TABS: 40.0000 mg | ORAL_TABLET | Freq: Every day | ORAL | 0 refills | Status: AC

## 2020-11-14 MED ORDER — CEFDINIR 300 MG PO CAPS: 300.0000 mg | ORAL_CAPSULE | Freq: Two times a day (BID) | ORAL | 0 refills | Status: AC

## 2020-11-14 NOTE — Progress Notes (Signed)
Physical Therapy Treatment Patient Details Name: Tammy Ball MRN: [id number] DOB: 1941-06-15 Today's Date: 11/14/2020    History of Present Illness 80 yo female admitted with Pna, weakness. Hx of COPE, depression, glaucoma, lung ca s/p lobectomy/radiation/chemo, anxiety, L nephrectomy    PT Comments    Progressing with mobility.  O2 87% on 3L with ambulation. Audible wheezing and dyspnea with activity. Pt reports she is to d/c home on today.   Follow Up Recommendations  Home health PT     Equipment Recommendations  None recommended by PT    Recommendations for Other Services       Precautions / Restrictions Precautions Precautions: Fall Precaution Comments: monitor O2 Restrictions Weight Bearing Restrictions: No    Mobility  Bed Mobility               General bed mobility comments: oob in recliner  Transfers Overall transfer level: Needs assistance Equipment used: 4-wheeled walker Transfers: Sit to/from Stand Sit to Stand: Supervision         General transfer comment: Cues for safety, hand placement.  Ambulation/Gait Ambulation/Gait assistance: Supervision Gait Distance (Feet): 125 Feet Assistive device: 4-wheeled walker Gait Pattern/deviations: Step-through pattern;Decreased stride length     General Gait Details: Supv level. No LOB with RW use. O2 87% on 3L.   Stairs             Wheelchair Mobility    Modified Rankin (Stroke Patients Only)       Balance Overall balance assessment: Mild deficits observed, not formally tested           Standing balance-Leahy Scale: Fair                              Cognition Arousal/Alertness: Awake/alert Behavior During Therapy: WFL for tasks assessed/performed Overall Cognitive Status: Within Functional Limits for tasks assessed                                        Exercises      General Comments        Pertinent Vitals/Pain Pain Assessment:  Faces Faces Pain Scale: Hurts little more Pain Location: chronic back pain Pain Descriptors / Indicators: Discomfort;Sore Pain Intervention(s): Monitored during session    Home Living                      Prior Function            PT Goals (current goals can now be found in the care plan section) Progress towards PT goals: Progressing toward goals    Frequency    Min 3X/week      PT Plan Current plan remains appropriate    Co-evaluation              AM-PAC PT "6 Clicks" Mobility   Outcome Measure  Help needed turning from your back to your side while in a flat bed without using bedrails?: None Help needed moving from lying on your back to sitting on the side of a flat bed without using bedrails?: None Help needed moving to and from a bed to a chair (including a wheelchair)?: A Little Help needed standing up from a chair using your arms (e.g., wheelchair or bedside chair)?: A Little Help needed to walk in hospital room?: A Little Help needed climbing 3-5 steps  with a railing? : A Little 6 Click Score: 20    End of Session Equipment Utilized During Treatment: Oxygen Activity Tolerance: Patient tolerated treatment well Patient left: in chair;with call bell/phone within reach   PT Visit Diagnosis: Muscle weakness (generalized) (M62.81);Unsteadiness on feet (R26.81)     Time: 9774-1423 PT Time Calculation (min) (ACUTE ONLY): 8 min  Charges:  $Gait Training: 8-22 mins                         Doreatha Massed, PT Acute Rehabilitation  Office: [phone] Pager: [phone]

## 2020-11-14 NOTE — Progress Notes (Signed)
Pt ready for discharge. Pt verbalizes understanding of all discharge instructions. All questions answered. Waiting on cab service.

## 2020-11-14 NOTE — Discharge Summary (Addendum)
Physician Discharge Summary  Tammy Ball QQP:[id number] DOB: February 06, 1941 DOA: 11/10/2020  PCP: Tammy Moron, MD  Admit date: 11/10/2020 Discharge date: 11/14/2020  Discharge disposition: Home with home health physical therapy    Recommendations for Outpatient Follow-Up:   Follow-up with PCP 1 week  Discharge Diagnosis:   Principal Problem:   CAP (community acquired pneumonia) Active Problems:   Essential hypertension   Hyperlipidemia   Non-small cell carcinoma of right lung, stage 2 (HCC)   Chronic respiratory failure with hypoxia (HCC)   CKD (chronic kidney disease) stage 3, GFR 30-59 ml/min (HCC)   COPD with acute exacerbation (HCC)   Chronic diastolic CHF (congestive heart failure) ([place])    Discharge Condition: Stable.  Diet recommendation:  Diet Order            Diet - low sodium heart healthy           Diet Heart Room service appropriate? Yes; Fluid consistency: Thin  Diet effective now                   Code Status: Full Code     Hospital Course:   Ms. Tammy Ball is a 80 year old woman with past medical history significant for COPD, chronic hypoxic respiratory failure (has not used oxygen for about a year), chronic diastolic CHF, hypertension, hyperlipidemia, non-small cell lung cancer status post right upper lobectomy and chemoradiation, renal cell carcinoma status post left nephrectomy.  She presented to the hospital with flulike symptoms over 1 week duration.  She complained of fever with temperature up to 102 F, purulent cough productive of bloody sputum, sore throat, diarrhea and abdominal discomfort.  She initially presented to the emergency department on 11/05/2020 and at that time she was found to have leukocytosis and possible lung consolidation.  She was discharged home on doxycycline since she wanted to go home and spend Christmas with her family.  However, despite taking doxycycline, her condition did not improve.  She noted she was  becoming hypoxic and was having to use her home oxygen.  She presented to the hospital again on 11/10/2020 and she was found to have Left lower lobe pneumonia.  She was treated with empiric IV antibiotics.  She was also requiring 3 L/min oxygen via nasal cannula for acute on chronic hypoxic respiratory failure.  She was given steroids and bronchodilators for COPD exacerbation.  Her condition slowly improved and she was deemed stable for discharge to home today.    Discharge Exam:    Vitals:   11/13/20 2143 11/14/20 0627 11/14/20 0629 11/14/20 0741  BP: (!) 155/88  137/90   Pulse: 76  72   Resp: 16  18   Temp: 97.9 F (36.6 C)  97.7 F (36.5 C)   TempSrc: Oral  Oral   SpO2: 97%  97% 98%  Weight:  90.2 kg    Height:         GEN: NAD SKIN: Warm and dry EYES: EOMI.  No pallor or icterus ENT: MMM CV: RRR PULM: Mild occasional wheezing.  No rales heard. ABD: soft, obese, NT, +BS CNS: AAO x 3, non focal EXT: No edema or tenderness   The results of significant diagnostics from this hospitalization (including imaging, microbiology, ancillary and laboratory) are listed below for reference.     Procedures and Diagnostic Studies:   CT Chest W Contrast  Result Date: 11/10/2020 CLINICAL DATA:  Non-small cell lung cancer. Cough with hemoptysis and shortness of breath. Reported recent negative  COVID testing. EXAM: CT CHEST WITH CONTRAST TECHNIQUE: Multidetector CT imaging of the chest was performed during intravenous contrast administration. CONTRAST:  22mL OMNIPAQUE IOHEXOL 300 MG/ML  SOLN COMPARISON:  Abdominal CT and chest radiographs 11/05/2020. Chest CT 05/30/2020 and 12/01/2019 FINDINGS: Cardiovascular: Atherosclerosis of the aorta, great vessels and coronary arteries. There is central enlargement of the pulmonary arteries consistent with pulmonary arterial hypertension. The heart size is stable. No significant pericardial fluid. Mediastinum/Nodes: There are no enlarged mediastinal,  hilar or axillary lymph nodes. The thyroid gland, trachea and esophagus demonstrate no significant findings. Lungs/Pleura: Moderate centrilobular and paraseptal emphysema with stable postsurgical changes from previous right upper lobe resection. There are stable paramediastinal radiation changes on the right. Compared with the recent abdominal CT, there are new trace and small left pleural effusions. There is associated increased compressive atelectasis and opacity within the left lower lobe obscuring the previously demonstrated nodular density. In addition, there are patchy airspace opacities with air bronchograms throughout the lingula, likely reflecting pneumonia. These were seen on recent chest radiographs. There is a somewhat nodular component measuring up to 2.8 x 2.2 cm on image 62/7, containing an air bronchogram. No suspicious right lung nodules. Upper abdomen: The visualized upper abdomen appears stable without significant findings post left nephrectomy. Musculoskeletal/Chest wall: There is no chest wall mass or suspicious osseous finding. Previous C6-7 ACDF. Stable compression deformities at T3, T4 and T6 with post spinal augmentation changes at T3 and T4. IMPRESSION: 1. New patchy airspace opacities with air bronchograms throughout the lingula and left lower lobe, likely reflecting pneumonia. Radiographic follow-up necessary to document resolution. 2. Compared with recent abdominal CT, new left-greater-than-right pleural effusions with associated partial left lower lobe collapse, obscuring a previously demonstrated nodular left lower lobe density. 3. Stable paramediastinal radiation changes on the right. No evidence of local recurrence or metastatic disease. 4. Central enlargement of the pulmonary arteries consistent with pulmonary arterial hypertension. 5. Aortic Atherosclerosis (ICD10-I70.0) and Emphysema (ICD10-J43.9). Electronically Signed   By: Richardean Sale M.D.   On: 11/10/2020 14:54      Labs:   Basic Metabolic Panel: Recent Labs  Lab 11/10/20 1221 11/11/20 0301 11/12/20 0259 11/13/20 0256 11/14/20 0354  NA 132* 132* 134* 137 139  K 4.5 4.8 5.0 5.2* 5.0  CL 94* 90* 93* 96* 96*  CO2 27 33* 31 30 32  GLUCOSE 101* 104* 139* 132* 124*  BUN 27* 28* 35* 40* 41*  CREATININE 1.44* 1.55* 1.29* 1.25* 1.12*  CALCIUM 9.9 9.6 9.4 9.7 9.9   GFR Estimated Creatinine Clearance: 45.3 mL/min (A) (by C-G formula based on SCr of 1.12 mg/dL (H)). Liver Function Tests: Recent Labs  Lab 11/10/20 1221  AST 25  ALT 23  ALKPHOS 88  BILITOT 0.7  PROT 7.2  ALBUMIN 3.4*   No results for input(s): LIPASE, AMYLASE in the last 168 hours. No results for input(s): AMMONIA in the last 168 hours. Coagulation profile No results for input(s): INR, PROTIME in the last 168 hours.  CBC: Recent Labs  Lab 11/10/20 1221 11/11/20 0301 11/12/20 0259 11/13/20 0256 11/14/20 0354  WBC 5.3 5.9 4.3 8.1 9.1  NEUTROABS 3.7  --   --   --   --   HGB 12.5 11.9* 11.4* 11.2* 12.3  HCT 38.7 37.8 35.3* 34.3* 38.4  MCV 92.6 94.3 92.2 91.2 93.7  PLT 344 332 392 425* 445*   Cardiac Enzymes: No results for input(s): CKTOTAL, CKMB, CKMBINDEX, TROPONINI in the last 168 hours. BNP: Invalid input(s): POCBNP  CBG: No results for input(s): GLUCAP in the last 168 hours. D-Dimer No results for input(s): DDIMER in the last 72 hours. Hgb A1c No results for input(s): HGBA1C in the last 72 hours. Lipid Profile No results for input(s): CHOL, HDL, LDLCALC, TRIG, CHOLHDL, LDLDIRECT in the last 72 hours. Thyroid function studies No results for input(s): TSH, T4TOTAL, T3FREE, THYROIDAB in the last 72 hours.  Invalid input(s): FREET3 Anemia work up No results for input(s): VITAMINB12, FOLATE, FERRITIN, TIBC, IRON, RETICCTPCT in the last 72 hours. Microbiology Recent Results (from the past 240 hour(s))  Blood culture (routine single)     Status: None   Collection Time: 11/05/20 10:50 AM   Specimen:  BLOOD  Result Value Ref Range Status   Specimen Description   Final    BLOOD LEFT ANTECUBITAL Performed at [place] [address], [place], [place] [address]    Special Requests   Final    BOTTLES DRAWN AEROBIC AND ANAEROBIC Blood Culture results may not be optimal due to an excessive volume of blood received in culture bottles Performed at [facility] [address], [place], [place] [address]    Culture   Final    NO GROWTH 5 DAYS Performed at [facility], [place] [address] [place], [place] [address]    Report Status 11/10/2020 FINAL  Final  Urine culture     Status: Abnormal   Collection Time: 11/05/20 11:03 AM   Specimen: In/Out Cath Urine  Result Value Ref Range Status   Specimen Description   Final    IN/OUT CATH URINE Performed at [facility] [address] [place], [place] [address]    Special Requests   Final    NONE Performed at [facility] [place] [address] [place], [place] [address]    Culture MULTIPLE SPECIES PRESENT, SUGGEST RECOLLECTION (A)  Final   Report Status 11/06/2020 FINAL  Final  Resp Panel by RT-PCR (Flu A&B, Covid) Nasopharyngeal Swab     Status: None   Collection Time: 11/05/20 11:22 AM   Specimen: Nasopharyngeal Swab; Nasopharyngeal(NP) swabs in vial transport medium  Result Value Ref Range Status   SARS Coronavirus 2 by RT PCR NEGATIVE NEGATIVE Final    Comment: (NOTE) SARS-CoV-2 target nucleic acids are NOT DETECTED.  The SARS-CoV-2 RNA is generally detectable in upper respiratory specimens during the acute phase of infection. The lowest concentration of SARS-CoV-2 viral copies this assay can detect is 138 copies/mL. A negative result does not preclude SARS-Cov-2 infection and should not be used as the sole basis for treatment or other patient management decisions. A negative result may occur with  improper specimen collection/handling, submission of  specimen other than nasopharyngeal swab, presence of viral mutation(s) within the areas targeted by this assay, and inadequate number of viral copies(<138 copies/mL). A negative result must be combined with clinical observations, patient history, and epidemiological information. The expected result is Negative.  Fact Sheet for Patients:  EntrepreneurPulse.com.au  Fact Sheet for Healthcare Providers:  IncredibleEmployment.be  This test is no t yet approved or cleared by the [place] FDA and  has been authorized for detection and/or diagnosis of SARS-CoV-2 by FDA under an Emergency Use Authorization (EUA). This EUA will remain  in effect (meaning this test can be used) for the duration of the COVID-19 declaration under Section 564(b)(1) of the Act, 21 U.S.C.section 360bbb-3(b)(1), unless the authorization is terminated  or revoked sooner.  Influenza A by PCR NEGATIVE NEGATIVE Final   Influenza B by PCR NEGATIVE NEGATIVE Final    Comment: (NOTE) The Xpert Xpress SARS-CoV-2/FLU/RSV plus assay is intended as an aid in the diagnosis of influenza from Nasopharyngeal swab specimens and should not be used as a sole basis for treatment. Nasal washings and aspirates are unacceptable for Xpert Xpress SARS-CoV-2/FLU/RSV testing.  Fact Sheet for Patients: EntrepreneurPulse.com.au  Fact Sheet for Healthcare Providers: IncredibleEmployment.be  This test is not yet approved or cleared by the [place] FDA and has been authorized for detection and/or diagnosis of SARS-CoV-2 by FDA under an Emergency Use Authorization (EUA). This EUA will remain in effect (meaning this test can be used) for the duration of the COVID-19 declaration under Section 564(b)(1) of the Act, 21 U.S.C. section 360bbb-3(b)(1), unless the authorization is terminated or revoked.  Performed at [facility], [place]  [address] [place], [place] [address]   Culture, blood (routine x 2)     Status: None (Preliminary result)   Collection Time: 11/10/20 12:21 PM   Specimen: BLOOD  Result Value Ref Range Status   Specimen Description   Final    BLOOD LEFT ANTECUBITAL Performed at [facility] [address], [place], [place] [address]    Special Requests   Final    BOTTLES DRAWN AEROBIC AND ANAEROBIC Blood Culture results may not be optimal due to an inadequate volume of blood received in culture bottles Performed at [facility] [address], [place], [place] [address]    Culture   Final    NO GROWTH 3 DAYS Performed at [facility], [place] [address], [place], [place] [address]    Report Status PENDING  Incomplete  Culture, blood (routine x 2)     Status: None (Preliminary result)   Collection Time: 11/10/20 12:21 PM   Specimen: BLOOD  Result Value Ref Range Status   Specimen Description   Final    BLOOD RIGHT ANTECUBITAL Performed at [facility] [address] [place], [place] [address]    Special Requests   Final    BOTTLES DRAWN AEROBIC AND ANAEROBIC Blood Culture adequate volume Performed at [facility] [address] [place], [place] [address]    Culture   Final    NO GROWTH 3 DAYS Performed at [facility], [place] [address], [place], [place] [address]    Report Status PENDING  Incomplete  Resp Panel by RT-PCR (Flu A&B, Covid) Nasopharyngeal Swab     Status: None   Collection Time: 11/10/20 12:32 PM   Specimen: Nasopharyngeal Swab; Nasopharyngeal(NP) swabs in vial transport medium  Result Value Ref Range Status   SARS Coronavirus 2 by RT PCR NEGATIVE NEGATIVE Final    Comment: (NOTE) SARS-CoV-2 target nucleic acids are NOT DETECTED.  The SARS-CoV-2 RNA is generally detectable in upper respiratory specimens during the acute phase of infection. The lowest concentration of SARS-CoV-2 viral  copies this assay can detect is 138 copies/mL. A negative result does not preclude SARS-Cov-2 infection and should not be used as the sole basis for treatment or other patient management decisions. A negative result may occur with  improper specimen collection/handling, submission of specimen other than nasopharyngeal swab, presence of viral mutation(s) within the areas targeted by this assay, and inadequate number of viral copies(<138 copies/mL). A negative result must be combined with clinical observations, patient history, and epidemiological information. The expected result is Negative.  Fact Sheet  for Patients:  EntrepreneurPulse.com.au  Fact Sheet for Healthcare Providers:  IncredibleEmployment.be  This test is no t yet approved or cleared by the [place] FDA and  has been authorized for detection and/or diagnosis of SARS-CoV-2 by FDA under an Emergency Use Authorization (EUA). This EUA will remain  in effect (meaning this test can be used) for the duration of the COVID-19 declaration under Section 564(b)(1) of the Act, 21 U.S.C.section 360bbb-3(b)(1), unless the authorization is terminated  or revoked sooner.       Influenza A by PCR NEGATIVE NEGATIVE Final   Influenza B by PCR NEGATIVE NEGATIVE Final    Comment: (NOTE) The Xpert Xpress SARS-CoV-2/FLU/RSV plus assay is intended as an aid in the diagnosis of influenza from Nasopharyngeal swab specimens and should not be used as a sole basis for treatment. Nasal washings and aspirates are unacceptable for Xpert Xpress SARS-CoV-2/FLU/RSV testing.  Fact Sheet for Patients: EntrepreneurPulse.com.au  Fact Sheet for Healthcare Providers: IncredibleEmployment.be  This test is not yet approved or cleared by the [place] FDA and has been authorized for detection and/or diagnosis of SARS-CoV-2 by FDA under an Emergency Use Authorization (EUA). This  EUA will remain in effect (meaning this test can be used) for the duration of the COVID-19 declaration under Section 564(b)(1) of the Act, 21 U.S.C. section 360bbb-3(b)(1), unless the authorization is terminated or revoked.  Performed at [facility], [place] [address] [place], [place] [address]   Group A Strep by PCR     Status: None   Collection Time: 11/10/20 12:32 PM   Specimen: Throat; Sterile Swab  Result Value Ref Range Status   Group A Strep by PCR NOT DETECTED NOT DETECTED Final    Comment: Performed at [facility], [place] [address] [place], [place] [address]  MRSA PCR Screening     Status: None   Collection Time: 11/10/20  6:50 PM   Specimen: Nasal Mucosa; Nasopharyngeal  Result Value Ref Range Status   MRSA by PCR NEGATIVE NEGATIVE Final    Comment:        The GeneXpert MRSA Assay (FDA approved for NASAL specimens only), is one component of a comprehensive MRSA colonization surveillance program. It is not intended to diagnose MRSA infection nor to guide or monitor treatment for MRSA infections. Performed at [facility], [place] [address], [place], [place] [address]   Sputum culture     Status: None   Collection Time: 11/10/20 11:00 PM   Specimen: Expectorated Sputum  Result Value Ref Range Status   Specimen Description EXPECTORATED SPUTUM  Final   Special Requests NONE  Final   Sputum evaluation   Final    THIS SPECIMEN IS ACCEPTABLE FOR SPUTUM CULTURE Performed at [facility], [place] [address], [place], [place] [address]    Report Status 11/10/2020 FINAL  Final  Culture, respiratory     Status: None   Collection Time: 11/10/20 11:00 PM  Result Value Ref Range Status   Specimen Description   Final    EXPECTORATED SPUTUM Performed at [facility] [address], [place], [place] [address]    Special Requests   Final    NONE Reflexed from [id number] Performed at [facility], [place] [address] [place], [place] [address]    Gram Stain   Final    NO WBC SEEN RARE GRAM POSITIVE COCCI IN PAIRS RARE GRAM POSITIVE RODS    Culture   Final  RARE Normal respiratory flora-no Staph aureus or Pseudomonas seen Performed at [facility] [address], [place], [place] [address]    Report Status 11/13/2020 FINAL  Final  Culture, Urine     Status: None   Collection Time: 11/11/20  8:27 AM   Specimen: Urine, Clean Catch  Result Value Ref Range Status   Specimen Description   Final    Urine Performed at [facility] [address] [place], [place] [address]    Special Requests   Final    NONE Performed at [facility], [place] [address], [place], [place] [address]    Culture   Final    NO GROWTH Performed at [facility], [place] [address] [place],  [address]    Report Status 11/12/2020 FINAL  Final     Discharge Instructions:   Discharge Instructions    Diet - low sodium heart healthy   Complete by: As directed    Face-to-face encounter (required for Medicare/Medicaid patients)   Complete by: As directed    I [place] certify that this patient is under my care and that I, or a nurse practitioner or physician's assistant working with me, had a face-to-face encounter that meets the physician face-to-face encounter requirements with this patient on 11/14/2020. The encounter with the patient was in whole, or in part for the following medical condition(s) which is the primary reason for home health care (List medical condition): debility, CHF   The encounter with the patient was in whole, or in part, for the following medical condition, which is the primary reason for home health care: debility, CHF   I certify that, based on my findings, the following services are medically necessary home health services: Physical therapy   Reason for Medically Necessary Home Health Services:  Therapy- Personnel officer, Public librarian   My clinical findings support the need for the above services: Shortness of breath with activity   Further, I certify that my clinical findings support that this patient is homebound due to: Shortness of Breath with activity   Home Health   Complete by: As directed    To provide the following care/treatments: PT   Increase activity slowly   Complete by: As directed      Allergies as of 11/14/2020      Reactions   Bee Venom Anaphylaxis, Shortness Of Breath, Swelling, Other (See Comments)   Swelling at site    Amlodipine Swelling, Other (See Comments)   Swelling of the ankles and hands    Levofloxacin Other (See Comments)   Joint pain    Hctz [hydrochlorothiazide] Palpitations, Other (See Comments)   Sweating       Medication List    STOP taking these medications   clonazePAM 0.5 MG tablet Commonly known as: KLONOPIN   doxycycline 100 MG capsule Commonly known as: VIBRAMYCIN   tiZANidine 2 MG tablet Commonly known as: ZANAFLEX   traZODone 50 MG tablet Commonly known as: DESYREL     TAKE these medications   acetaminophen 500 MG tablet Commonly known as: TYLENOL Take 500 mg by mouth every 6 (six) hours as needed for mild pain, moderate pain, fever or headache. Reported on 05/24/2016   albuterol (2.5 MG/3ML) 0.083% nebulizer solution Commonly known as: PROVENTIL Take 3 mLs (2.5 mg total) by nebulization every 6 (six) hours as needed for wheezing or shortness of breath.   albuterol 108 (90 Base) MCG/ACT inhaler Commonly known as:  Ventolin HFA Inhale 1-2 puffs into the lungs every 6 (six) hours as needed for wheezing or shortness of breath.   ARTIFICIAL TEARS OP Place 1 drop into both eyes daily as needed (dry eyes).   aspirin EC 81 MG tablet Take 81 mg by mouth daily.   atorvastatin 40 MG tablet Commonly known as: LIPITOR TAKE 1 TABLET BY MOUTH EVERY DAY   calcium carbonate 750 MG chewable  tablet Commonly known as: TUMS EX Chew 1 tablet by mouth daily as needed for heartburn.   carvedilol 12.5 MG tablet Commonly known as: COREG Take 12.5 mg by mouth 2 (two) times daily.   cefdinir 300 MG capsule Commonly known as: OMNICEF Take 1 capsule (300 mg total) by mouth every 12 (twelve) hours for 3 days.   fish oil-omega-3 fatty acids 1000 MG capsule Take 1 capsule by mouth daily.   losartan 100 MG tablet Commonly known as: COZAAR Take 100 mg by mouth daily. What changed: Another medication with the same name was removed. Continue taking this medication, and follow the directions you see here.   Mucinex Maximum Strength 1200 MG Tb12 Generic drug: Guaifenesin Take 1,200 mg by mouth 2 (two) times daily.   ondansetron 4 MG tablet Commonly known as: ZOFRAN Take 1 tablet (4 mg total) by mouth every 6 (six) hours. What changed:   when to take this  reasons to take this   OXYGEN Inhale 2 L into the lungs continuous.   predniSONE 20 MG tablet Commonly known as: DELTASONE Take 2 tablets (40 mg total) by mouth daily before breakfast for 2 days. Start taking on: November 15, 2020   sodium chloride 0.65 % Soln nasal spray Commonly known as: OCEAN Place 1 spray into both nostrils daily.   traMADol 50 MG tablet Commonly known as: ULTRAM Take 1 tablet (50 mg total) by mouth every 6 (six) hours as needed (BACK PAIN).   Trelegy Ellipta 100-62.5-25 MCG/INH Aepb Generic drug: Fluticasone-Umeclidin-Vilant INHALE 1 PUFF BY MOUTH EVERY DAY What changed: See the new instructions.   vitamin C 1000 MG tablet Take 1,000 mg by mouth daily.   VITAMIN D3 PO Take 1,200 mg by mouth daily.         Time coordinating discharge: 34 minutes  Signed:  Elza Sortor  [facility] 11/14/2020, 11:52 AM   Pager on [email or web address]. If 7PM-7AM, please contact night-coverage at [email or web address]

## 2020-11-14 NOTE — TOC Transition Note (Signed)
Transition of Care [facility]) - CM/SW Discharge Note   Patient Details  Name: Tammy Ball MRN: [id number] Date of Birth: 1941-01-11  Transition [facility]) CM/SW Contact:  Lennart Pall, LCSW Phone Number: 11/14/2020, 1:29 PM   Clinical Narrative:    Pt medically cleared for dc today.  HHPT follow up recommended and pt is agreeable with no agency preference.  Referral placed with [facility].  No further TOC needs.   Final next level of care: [place] Barriers to Discharge: Barriers Resolved   Patient Goals and CMS Choice Patient states their goals for this hospitalization and ongoing recovery are:: return home      Discharge Placement                       Discharge Plan and Services In-house Referral: Clinical Social Work                        HH Arranged: PT HH Agency: [place] Date [facility] Agency Contacted: 11/14/20 Time HH Agency Contacted: 1200 Representative spoke with at [place]: [place] Determinants of Health ([place]) Interventions     Readmission Risk Interventions Readmission Risk Prevention Plan 11/11/2020  Transportation Screening Complete  PCP or Specialist Appt within 5-7 Days Complete  Home Care Screening Complete  Some recent data might be hidden

## 2020-11-15 ENCOUNTER — Other Ambulatory Visit: Payer: Self-pay | Admitting: Emergency Medicine

## 2020-11-15 LAB — CULTURE, BLOOD (ROUTINE X 2)
Culture: NO GROWTH
Culture: NO GROWTH
Special Requests: ADEQUATE

## 2020-11-15 MED ORDER — LOSARTAN POTASSIUM 100 MG PO TABS: 100.0000 mg | ORAL_TABLET | Freq: Every day | ORAL | 0 refills | Status: DC

## 2020-11-15 NOTE — Telephone Encounter (Signed)
Medication Refill - Medication: Pt is missing BP medication, was recently seen in the hospital and states they failed to send her medication to her pharmacy. She is out of losartan, she needs her's with potassium she states. Did not see on chart, Please advise   Has the patient contacted their pharmacy? Yes.   (Agent: If no, request that the patient contact the pharmacy for the refill.) (Agent: If yes, when and what did the pharmacy advise?)  Preferred Pharmacy (with phone number or street name):  [id number] [address], [place].  [place] [address]  Phone: [phone] Fax: [phone]     Agent: Please be advised that RX refills may take up to 3 business days. We ask that you follow-up with your pharmacy.

## 2020-11-15 NOTE — Telephone Encounter (Signed)
Requested medication (s) are due for refill today: Unclear  Requested medication (s) are on the active medication list: Yes  Last refill:  11/05/20  Future visit scheduled: Yes  Notes to clinic:  Historical provider.    Requested Prescriptions  Pending Prescriptions Disp Refills   losartan (COZAAR) 100 MG tablet      Sig: Take 1 tablet (100 mg total) by mouth daily.      Cardiovascular:  Angiotensin Receptor Blockers Failed - 11/15/2020 11:31 AM      Failed - Cr in normal range and within 180 days    Creatinine  Date Value Ref Range Status  05/30/2020 1.20 (H) 0.44 - 1.00 mg/dL Final  11/11/2017 1.3 (H) 0.6 - 1.1 mg/dL Final   Creatinine, Ser  Date Value Ref Range Status  11/14/2020 1.12 (H) 0.44 - 1.00 mg/dL Final   Creatinine, Urine  Date Value Ref Range Status  04/20/2011 56.7 mg/dL Final          Failed - Last BP in normal range    BP Readings from Last 1 Encounters:  11/14/20 137/90          Failed - Valid encounter within last 6 months    Recent Outpatient Visits           8 months ago Essential hypertension   [facility] at [place], MD   11 months ago Uncontrolled hypertension   Primary Care at [facility], Arlie Solomons, MD   1 year ago Essential hypertension   [facility] at [facility], [place], NP   1 year ago Acute left-sided low back pain with left-sided sciatica   Primary Care at [facility], Arlie Solomons, MD   1 year ago Essential hypertension   Primary Care at [facility], Arlie Solomons, MD       Future Appointments             In 1 week [name], Ines Bloomer, MD Primary Care at [place], [place] in normal range and within 180 days    Potassium  Date Value Ref Range Status  11/14/2020 5.0 3.5 - 5.1 mmol/L Final  11/11/2017 4.6 3.5 - 5.1 mEq/L Final          Passed - Patient is not pregnant

## 2020-11-16 DIAGNOSIS — I5032 Chronic diastolic (congestive) heart failure: Secondary | ICD-10-CM | POA: Diagnosis not present

## 2020-11-16 DIAGNOSIS — T451X5S Adverse effect of antineoplastic and immunosuppressive drugs, sequela: Secondary | ICD-10-CM | POA: Diagnosis not present

## 2020-11-16 DIAGNOSIS — Z85118 Personal history of other malignant neoplasm of bronchus and lung: Secondary | ICD-10-CM | POA: Diagnosis not present

## 2020-11-16 DIAGNOSIS — F32A Depression, unspecified: Secondary | ICD-10-CM | POA: Diagnosis not present

## 2020-11-16 DIAGNOSIS — M170 Bilateral primary osteoarthritis of knee: Secondary | ICD-10-CM | POA: Diagnosis not present

## 2020-11-16 DIAGNOSIS — J9610 Chronic respiratory failure, unspecified whether with hypoxia or hypercapnia: Secondary | ICD-10-CM | POA: Diagnosis not present

## 2020-11-16 DIAGNOSIS — Z7951 Long term (current) use of inhaled steroids: Secondary | ICD-10-CM | POA: Diagnosis not present

## 2020-11-16 DIAGNOSIS — N1831 Chronic kidney disease, stage 3a: Secondary | ICD-10-CM | POA: Diagnosis not present

## 2020-11-16 DIAGNOSIS — F419 Anxiety disorder, unspecified: Secondary | ICD-10-CM | POA: Diagnosis not present

## 2020-11-16 DIAGNOSIS — I130 Hypertensive heart and chronic kidney disease with heart failure and stage 1 through stage 4 chronic kidney disease, or unspecified chronic kidney disease: Secondary | ICD-10-CM | POA: Diagnosis not present

## 2020-11-16 DIAGNOSIS — E785 Hyperlipidemia, unspecified: Secondary | ICD-10-CM | POA: Diagnosis not present

## 2020-11-16 DIAGNOSIS — Z9981 Dependence on supplemental oxygen: Secondary | ICD-10-CM | POA: Diagnosis not present

## 2020-11-16 DIAGNOSIS — G620 Drug-induced polyneuropathy: Secondary | ICD-10-CM | POA: Diagnosis not present

## 2020-11-16 DIAGNOSIS — F40240 Claustrophobia: Secondary | ICD-10-CM | POA: Diagnosis not present

## 2020-11-16 DIAGNOSIS — H409 Unspecified glaucoma: Secondary | ICD-10-CM | POA: Diagnosis not present

## 2020-11-16 DIAGNOSIS — I272 Pulmonary hypertension, unspecified: Secondary | ICD-10-CM | POA: Diagnosis not present

## 2020-11-16 DIAGNOSIS — G4700 Insomnia, unspecified: Secondary | ICD-10-CM | POA: Diagnosis not present

## 2020-11-16 DIAGNOSIS — Z902 Acquired absence of lung [part of]: Secondary | ICD-10-CM | POA: Diagnosis not present

## 2020-11-16 DIAGNOSIS — Z9181 History of falling: Secondary | ICD-10-CM | POA: Diagnosis not present

## 2020-11-16 DIAGNOSIS — Z7982 Long term (current) use of aspirin: Secondary | ICD-10-CM | POA: Diagnosis not present

## 2020-11-16 DIAGNOSIS — D509 Iron deficiency anemia, unspecified: Secondary | ICD-10-CM | POA: Diagnosis not present

## 2020-11-16 DIAGNOSIS — R011 Cardiac murmur, unspecified: Secondary | ICD-10-CM | POA: Diagnosis not present

## 2020-11-16 DIAGNOSIS — J439 Emphysema, unspecified: Secondary | ICD-10-CM | POA: Diagnosis not present

## 2020-11-16 DIAGNOSIS — J181 Lobar pneumonia, unspecified organism: Secondary | ICD-10-CM | POA: Diagnosis not present

## 2020-11-16 DIAGNOSIS — J9819 Other pulmonary collapse: Secondary | ICD-10-CM | POA: Diagnosis not present

## 2020-11-17 ENCOUNTER — Other Ambulatory Visit: Payer: Self-pay | Admitting: *Deleted

## 2020-11-17 ENCOUNTER — Encounter: Payer: Self-pay | Admitting: *Deleted

## 2020-11-17 NOTE — Patient Instructions (Addendum)
Remember to schedule Pulmonary follow up with Dr. Lamonte Sakai in the next 2 weeks  Goals Addressed            This Visit's Progress   . [facility]) Learn More About My Health       Timeframe:  Short-Term Goal Priority:  Medium Start Date: 11/17/2020                            Expected End Date: 12/17/2020                       Follow Up Date 11/24/2020    - tell my story and reason for my visit - make a list of questions - ask questions - repeat what I heard to make sure I understand - bring a list of my medicines to the visit - speak up when I don't understand    Why is this important?    The best way to learn about your health and care is by talking to the doctor and nurse.   They will answer your questions and give you information in the way that you like best.    Notes:     . [facility]) Make and Keep All Appointments       Timeframe:  Long-Range Goal Priority:  High Start Date:   11/17/2020                          Expected End Date:   02/09/21                    Follow Up Date 11/24/20    - ask family or friend for a ride - call to cancel if needed - keep a calendar with appointment dates  -review list of transportation options mailed out to you -schedule Pulmonary follow up appointment within the next 2 weeks   Why is this important?    Part of staying healthy is seeing the doctor for follow-up care.   If you forget your appointments, there are some things you can do to stay on track.    Notes:     . [facility]) Manage My Medicine       Timeframe:  Short-Term Goal Priority:  High Start Date:  11/17/2020                           Expected End Date:  01/09/21                     Follow Up Date 11/24/20   - call for medicine refill 2 or 3 days before it runs out - call if I am sick and can't take my medicine - keep a list of all the medicines I take; vitamins and herbals too - use a pillbox to sort medicine  -work with [place] for medication assistance for Trelegy -discuss  Losartan medication with primary care provider   Why is this important?   . These steps will help you keep on track with your medicines.   Notes:     . [facility]) Track and Manage My Symptoms-COPD       Timeframe:  Long-Range Goal Priority:  Medium Start Date:   11/17/20  Expected End Date:  05/11/21                     Follow Up Date 11/24/20    - begin a symptom diary - bring symptom diary to all visits - develop a rescue plan - eliminate symptom triggers at home - follow rescue plan if symptoms flare-up - keep follow-up appointments  -continue to monitor blood pressures daily -take blood pressure log to PCP appointment for provider review   Why is this important?    Tracking your symptoms and other information about your health helps your doctor plan your care.   Write down the symptoms, the time of day, what you were doing and what medicine you are taking.   You will soon learn how to manage your symptoms.     Notes:       Hyperkalemia Hyperkalemia is when you have too much potassium in your blood. Potassium helps your body in many ways, but having too much can cause problems. If there is too much potassium in your blood, it can affect how your heart works. Potassium is normally removed from your body by your kidneys. Many things can cause the amount in your blood to be high. Medicines and other treatments can be used to bring the amount to a normal level. Treatment may need to be done in the hospital. Follow these instructions at home:   Take over-the-counter and prescription medicines only as told by your doctor.  Do not take any of the following unless your doctor says it is okay: ? Supplements. ? Natural products. ? Herbs. ? Vitamins.  Limit how much alcohol you drink as told by your doctor.  Do not use drugs. If you need help quitting, ask your doctor.  If you have kidney disease, you may need to follow a low-potassium diet. A food  specialist (dietitian) can help you.  Keep all follow-up visits as told by your doctor. This is important. Contact a doctor if:  Your heartbeat is not regular or is very slow.  You feel dizzy (light-headed).  You feel weak.  You feel sick to your stomach (nauseous).  You have tingling in your hands or feet.  You lose feeling (have numbness) in your hands or feet. Get help right away if:  You are short of breath.  You have chest pain.  You pass out (faint).  You cannot move your muscles. Summary  Hyperkalemia is when you have too much potassium in your blood.  Take over-the-counter and prescription medicines only as told by your doctor.  Limit how much alcohol you drink as told by your doctor.  Contact a doctor if your heartbeat is not regular. This information is not intended to replace advice given to you by your health care provider. Make sure you discuss any questions you have with your health care provider. Document Revised: 10/14/2017 Document Reviewed: 10/14/2017 Elsevier Patient Education  [place].

## 2020-11-17 NOTE — Patient Outreach (Signed)
[facility]) Care Management  [place]  11/17/2020   Tammy Ball 1941/06/07 [id number]    EMMI Alert Follow Up  Red on EMMI Alert -General EMMI Day:  #1 Date: 11/16/2020 Red Alert Reason:  Questions about discharge papers? Yes; Know who to call about changes in condition? No; Transportation to follow up? No; Unfilled prescriptions? Yes; Other questions/problems? Yes   RN Telephonic Care Coordinator Initial Assessment RN Telephonic Care Coordinator Transition of Care Week #1  Referral Date:  11/16/2020 Referral Source:  EMMI General Discharge Reason for Referral:  EMMI Red Alert Insurance:  Medicare   Outreach Attempt:  Successful telephone outreach to patient for follow up [place].  HIPAA verified with patient.  Patient answered on EMMI General Discharge-Questions about discharge papers? Yes; Know who to call about changes in condition? No; Transportation to follow up? No; Unfilled prescriptions? Yes; Other questions/problems? Yes.  Patient states she has scheduled follow up appointment with primary care provider and has transportation to that appointment at this time.  States she did have question and concern about her medication, Losartan because she was told her potassium was elevated at the hospital and understood Losartan has potassium in it.  States she has asked her Pharmacy to provide Losartan without potassium and they are not understanding her request.  RN Care Coordinator contacted patient's Pharmacy and was informed all forms of Losartan has potassium as pill base.  Outreached back out to patient and discussed this information.  Encouraged patient to discuss this with primary care at appointment next week.  Patient stated she would and understood to continue to take medication for blood pressure while awaiting to discuss with provider.  Social:  Lives at home alone.  Reports needing assistance with ADLs bathing and IADLs cooking and cleaning  since discharge home; children are assisting with these task.  Ambulating with rolling walker at this time and reporting 1 fall in the last year.  Reports fall Christmas day trying to get up from toilet, reporting legs went limp; no injury.  Normally transports self to medical appointments or children drive her.  Patient reporting she would like to get information about other transportation options if possible.  [place] limits ([address] [place], [place] [address]).  Is receiving home health physical therapy and home health bath aide from [facility].  States therapy was out yesterday to work with her.  States they are working on getting her bedside commode or toilet seat lift.  DME in the home include:  Rolling walker, upper dentures, lower partials, pulse oximetry, blood pressure cuff, eyeglasses, scale, nebulizer, oxygen, and grab bars in shower and around toilet.  Conditions:  Per chart review and discussion with patient, PMH include but not limited to:  Anemia, COPD and home oxygen dependent, anxiety, depression, glaucoma, hyperlipidemia, hypertension, lung cancer with right upper lobectomy, renal cancer with left nephrectomy, and back surgery.  Recent discharge from hospital on 11/14/2020 with pneumonia.  Reports completing antibiotics and steroid taper today.  Does state she feels better today, but continues to be weak.  Reports compliance with home oxygen at 2 liters and reports saturations have been 90-93% and decreasing to 80's with activity.  States she may increase oxygen to 2 1/2 liters.  Reports using rescue nebulizer about twice a day and some lower extremity edema.  But states the edema is getting better.  Encouraged patient to contact provider or pulmonologist if shortness of breath gets worse  or oxygen needs gets greater.  Reviewed COPD signs and symptoms and action plan.  Does monitor blood pressures daily.  Reports this mornings blood pressure was 135/93 as she was taking  her morning medications.  Encouraged to continue to monitor blood pressures and to take log to medical appointment next week.  Medications: Reports taking about 12 medications daily.  Manages medications herself with weekly pill box fills.  Does report her next refill of Trelegy inhaler will be > $500.  Discussed [place] referral for medication assistance and patient verbally agrees to referral.  Also encouraged patient to discuss Losartan and potassium with provider and pharmacist.  Encounter Medications:  Outpatient Encounter Medications as of 11/17/2020  Medication Sig Note  . acetaminophen (TYLENOL) 500 MG tablet Take 500 mg by mouth every 6 (six) hours as needed for mild pain, moderate pain, fever or headache. Reported on 05/24/2016   . albuterol (PROVENTIL) (2.5 MG/3ML) 0.083% nebulizer solution Take 3 mLs (2.5 mg total) by nebulization every 6 (six) hours as needed for wheezing or shortness of breath.   Marland Kitchen albuterol (VENTOLIN HFA) 108 (90 Base) MCG/ACT inhaler Inhale 1-2 puffs into the lungs every 6 (six) hours as needed for wheezing or shortness of breath.   . Ascorbic Acid (VITAMIN C) 1000 MG tablet Take 1,000 mg by mouth daily.   Marland Kitchen aspirin EC 81 MG tablet Take 81 mg by mouth daily.   Marland Kitchen atorvastatin (LIPITOR) 40 MG tablet TAKE 1 TABLET BY MOUTH EVERY DAY (Patient taking differently: Take 40 mg by mouth daily.)   . calcium carbonate (TUMS EX) 750 MG chewable tablet Chew 1 tablet by mouth daily as needed for heartburn.   . Carboxymethylcellulose Sodium (ARTIFICIAL TEARS OP) Place 1 drop into both eyes daily as needed (dry eyes).   . carvedilol (COREG) 12.5 MG tablet Take 12.5 mg by mouth 2 (two) times daily.   . cefdinir (OMNICEF) 300 MG capsule Take 1 capsule (300 mg total) by mouth every 12 (twelve) hours for 3 days. 11/17/2020: Completed today  . Cholecalciferol (VITAMIN D3 PO) Take 1,200 mg by mouth daily.   . fish oil-omega-3 fatty acids 1000 MG capsule Take 1 capsule by mouth daily.    . fluticasone (FLONASE) 50 MCG/ACT nasal spray Place 2 sprays into both nostrils daily.   . Guaifenesin (MUCINEX MAXIMUM STRENGTH) 1200 MG TB12 Take 1,200 mg by mouth 2 (two) times daily.   Marland Kitchen losartan (COZAAR) 100 MG tablet Take 1 tablet (100 mg total) by mouth daily.   . ondansetron (ZOFRAN) 4 MG tablet Take 1 tablet (4 mg total) by mouth every 6 (six) hours. (Patient taking differently: Take 4 mg by mouth every 8 (eight) hours as needed for nausea or vomiting.)   . OXYGEN Inhale 2 L into the lungs continuous.   . predniSONE (DELTASONE) 20 MG tablet Take 2 tablets (40 mg total) by mouth daily before breakfast for 2 days. 11/17/2020: Completed today  . sodium chloride (OCEAN) 0.65 % SOLN nasal spray Place 1 spray into both nostrils daily.   . traMADol (ULTRAM) 50 MG tablet Take 1 tablet (50 mg total) by mouth every 6 (six) hours as needed (BACK PAIN).   Marland Kitchen TRELEGY ELLIPTA 100-62.5-25 MCG/INH AEPB INHALE 1 PUFF BY MOUTH EVERY DAY (Patient taking differently: Inhale 1 puff into the lungs daily.)    No facility-administered encounter medications on file as of 11/17/2020.    Functional Status:  In your present state of health, do you have any difficulty performing  the following activities: 11/10/2020 03/29/2020  Hearing? N N  Vision? N N  Difficulty concentrating or making decisions? N N  Walking or climbing stairs? 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 N N  Doing errands, shopping? N N  Preparing Food and eating ? - N  Using the Toilet? - N  In the past six months, have you accidently leaked urine? - N  Do you have problems with loss of bowel control? - N  Managing your Medications? - N  Managing your Finances? - N  Housekeeping or managing your Housekeeping? - N  Some recent data might be hidden    Fall/Depression Screening: Fall Risk  11/17/2020 03/29/2020 03/07/2020  Falls in the past year? 1 1 0  Number falls in past yr: 0 0 0  Comment - fell out of  chair no injury -  Injury with Fall? 0 0 0  Risk for fall due to : History of fall(s);Medication side effect;Impaired balance/gait;Impaired mobility;Impaired vision - -  Follow up Falls evaluation completed;Education provided;Falls prevention discussed Falls evaluation completed;Education provided Falls evaluation completed   PHQ 2/9 Scores 11/17/2020 03/29/2020 03/07/2020 12/07/2019 09/01/2019 06/01/2019 02/19/2019  PHQ - 2 Score 1 0 0 0 0 2 2  PHQ- 9 Score - - - - - 8 6    Goals Addressed            This Visit's Progress   . [facility]) Learn More About My Health       Timeframe:  Short-Term Goal Priority:  Medium Start Date: 11/17/2020                            Expected End Date: 12/17/2020                       Follow Up Date 11/24/2020    - tell my story and reason for my visit - make a list of questions - ask questions - repeat what I heard to make sure I understand - bring a list of my medicines to the visit - speak up when I don't understand    Why is this important?    The best way to learn about your health and care is by talking to the doctor and nurse.   They will answer your questions and give you information in the way that you like best.    Notes:     . [facility]) Make and Keep All Appointments       Timeframe:  Long-Range Goal Priority:  High Start Date:   11/17/2020                          Expected End Date:   02/09/21                    Follow Up Date 11/24/20    - ask family or friend for a ride - call to cancel if needed - keep a calendar with appointment dates  -review list of transportation options mailed out to you -schedule Pulmonary follow up appointment within the next 2 weeks   Why is this important?    Part of staying healthy is seeing the doctor for follow-up care.   If you forget your appointments, there are some things you can do to stay on track.    Notes:     Marland Kitchen (  [facility]) Manage My Medicine       Timeframe:  Short-Term Goal Priority:  High Start  Date:  11/17/2020                           Expected End Date:  01/09/21                     Follow Up Date 11/24/20   - call for medicine refill 2 or 3 days before it runs out - call if I am sick and can't take my medicine - keep a list of all the medicines I take; vitamins and herbals too - use a pillbox to sort medicine  -work with [place] for medication assistance for Trelegy -discuss Losartan medication with primary care provider   Why is this important?   . These steps will help you keep on track with your medicines.   Notes:     . [facility]) Track and Manage My Symptoms-COPD       Timeframe:  Long-Range Goal Priority:  Medium Start Date:   11/17/20                          Expected End Date:  05/11/21                     Follow Up Date 11/24/20    - begin a symptom diary - bring symptom diary to all visits - develop a rescue plan - eliminate symptom triggers at home - follow rescue plan if symptoms flare-up - keep follow-up appointments  -continue to monitor blood pressures daily -take blood pressure log to PCP appointment for provider review   Why is this important?    Tracking your symptoms and other information about your health helps your doctor plan your care.   Write down the symptoms, the time of day, what you were doing and what medicine you are taking.   You will soon learn how to manage your symptoms.     Notes:       Appointments:  Has scheduled appointment with Dr. [name] with Primary Care at [facility] on 11/22/2020 at 1100.  Confirms she plans to continue primary care with [place].  Needs to schedule follow up with Pulmonologist and patient encouraged to do so as soon as possible.  Advanced Directives:  Reports having Living Will and [place] in place and does not wish to make any changes at this time.   Consent:  [facility] services reviewed and discussed.  Patient agrees to [facility] services and outreaches.  Plan: Follow-up:  Patient agrees to  Care Plan and Follow-up. RN Care Coordinator will send primary MD barriers letter. RN Care Coordinator will route initial telephone assessment note to primary MD. RN Care Coordinator will send patient [place]. RN Care Coordinator will send patient [facility] 2022 Calendar Booklet. RN Care Coordinator will send patient Living Well with COPD Educational Packet. RN Care Coordinator will send patient education on hyperkalemia. RN Care Coordinator will make next telephone outreach to patient within the next week and patient agrees to future outreach. RN Care Coordinator will collaborate with [place] Worker to provide patient list of transportation options. RN Care Coordinator will place [place] referral for medication assistance and medication reconciliation. [place] Management  RN Telephonic Care Coordinator [phone] [email or web address]

## 2020-11-18 NOTE — Patient Outreach (Signed)
[facility]) Care Management  11/18/2020  LORENIA HOSTON 09-22-1941 [id number]  Referral for medication assistance from Hubert Azure, RN sent to [place].  Ina Homes [facility] Management Assistant [phone]

## 2020-11-21 ENCOUNTER — Telehealth: Payer: Self-pay | Admitting: Family Medicine

## 2020-11-21 ENCOUNTER — Other Ambulatory Visit: Payer: Self-pay | Admitting: *Deleted

## 2020-11-21 DIAGNOSIS — J9610 Chronic respiratory failure, unspecified whether with hypoxia or hypercapnia: Secondary | ICD-10-CM | POA: Diagnosis not present

## 2020-11-21 DIAGNOSIS — J9819 Other pulmonary collapse: Secondary | ICD-10-CM | POA: Diagnosis not present

## 2020-11-21 DIAGNOSIS — I130 Hypertensive heart and chronic kidney disease with heart failure and stage 1 through stage 4 chronic kidney disease, or unspecified chronic kidney disease: Secondary | ICD-10-CM | POA: Diagnosis not present

## 2020-11-21 DIAGNOSIS — J439 Emphysema, unspecified: Secondary | ICD-10-CM | POA: Diagnosis not present

## 2020-11-21 DIAGNOSIS — J181 Lobar pneumonia, unspecified organism: Secondary | ICD-10-CM | POA: Diagnosis not present

## 2020-11-21 DIAGNOSIS — I272 Pulmonary hypertension, unspecified: Secondary | ICD-10-CM | POA: Diagnosis not present

## 2020-11-21 NOTE — Patient Outreach (Signed)
[facility]) Care Management  11/21/2020  Tammy Ball 25-Nov-1940 [id number]   RED ON EMMI ALERT - General Discharge Day # 4 Date: 11/19/2020 Red Alert Reason: Feeling sad/anxious/empty/anxious and Other questions/problems   Outreach attempt #1, successful.    Member called for above noted EMMI alert.  State she has been sad due to her hospitalization and process of recovery.  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  She is working with PT and has a home health aide that will be starting this week.  She will discuss an order for shower chair and 3 in 1 commode with provider.  Weekly transition of care assessment:   Member report she has appointment with PCP tomorrow.  Her son will take her but she will need a ride home.  She usually is able to drive herself but unable to do so until she is stronger.  Agrees to have CSW contact her regarding resources.    She remains concerned about the cost of her Trellegy, aware that referral has been placed to pharmacy team.  She continues to monitor blood pressure and oxygen levels daily but does not monitor weights daily.  She will begin to do so.  Oxygen levels on O2 have ranged from 93-96%.  Report blood pressure is intermittently low, ranging 80's/50's, stopped her evening dose of Coreg and blood pressure has improved.  She will take copy of BP trends and make PCP aware of this tomorrow, will anticipate medication changes.  Denies any urgent concerns, encouraged to contact this care manager with questions.    Plan: RN CM will follow up with member within the next week.  Goals Addressed            This Visit's Progress   . [facility]) Learn More About My Health   On track    Timeframe:  Short-Term Goal Priority:  Medium Start Date: 11/17/2020                            Expected End Date: 12/17/2020                        Follow Up Date 11/28/2020    - tell my story and reason for my visit - make a list of questions - ask questions - repeat what I heard to make sure I understand - bring a list of my medicines to the visit - speak up when I don't understand    Why is this important?    The best way to learn about your health and care is by talking to the doctor and nurse.   They will answer your questions and give you information in the way that you like best.    Notes:     . [facility]) Make and Keep All Appointments   On track    Timeframe:  Long-Range Goal Priority:  High Start Date:   11/17/2020                          Expected End Date:   02/09/21                    Follow Up Date 11/28/20    - ask  family or friend for a ride - call to cancel if needed - keep a calendar with appointment dates  -review list of transportation options mailed out to you -schedule Pulmonary follow up appointment within the next 2 weeks   Why is this important?    Part of staying healthy is seeing the doctor for follow-up care.   If you forget your appointments, there are some things you can do to stay on track.    Notes:   1/10 - Referral to CSW for transportation resources    . [facility]) Manage My Medicine   On track    Timeframe:  Short-Term Goal Priority:  High Start Date:  11/17/2020                           Expected End Date:  01/09/21                     Follow Up Date 11/28/20   - call for medicine refill 2 or 3 days before it runs out - call if I am sick and can't take my medicine - keep a list of all the medicines I take; vitamins and herbals too - use a pillbox to sort medicine  -work with [facility] for medication assistance for Trelegy -discuss Losartan medication with primary care provider   Why is this important?   . These steps will help you keep on track with your medicines.   Notes:     . [facility]) Track and Manage My Symptoms-COPD   On track    Timeframe:  Long-Range  Goal Priority:  Medium Start Date:   11/17/20                          Expected End Date:  05/11/21                     Follow Up Date 11/28/20    - begin a symptom diary - bring symptom diary to all visits - develop a rescue plan - eliminate symptom triggers at home - follow rescue plan if symptoms flare-up - keep follow-up appointments  -continue to monitor blood pressures daily -take blood pressure log to PCP appointment for provider review   Why is this important?    Tracking your symptoms and other information about your health helps your doctor plan your care.   Write down the symptoms, the time of day, what you were doing and what medicine you are taking.   You will soon learn how to manage your symptoms.     Notes:   1/10 - Reminded to monitor and record weight, blood pressure, and oxygen levels daily      Valente David, Therapist, sports, MSN [place] [phone]

## 2020-11-21 NOTE — Telephone Encounter (Signed)
   Tammy Ball DOB: 01/02/1941 MRN: [id number]   RIDER WAIVER AND RELEASE OF LIABILITY  For purposes of improving physical access to our facilities, [facility] is pleased to partner with third parties to provide [facility] patients or other authorized individuals the option of convenient, on-demand ground transportation services (the Technical bre[employer]") through use of the technology service that enables users to request on-demand ground transportation from independent third-party providers.  By opting to use and accept these [employer], I, the undersigned, hereby agree on behalf of myself, and on behalf of any minor child using the [employer] for whom I am the parent or legal guardian, as follows:  1. Government social research officer provided to me are provided by independent third-party transportation providers who are not [employer] or employees and who are unaffiliated with [employer]. 2. [facility] is neither a transportation carrier nor a common or public carrier. 3. [facility] has no control over the quality or safety of the transportation that occurs as a result of the [employer]. 4. [facility] cannot guarantee that any third-party transportation provider will complete any arranged transportation service. 5. [facility] makes no representation, warranty, or guarantee regarding the reliability, timeliness, quality, safety, suitability, or availability of any of the Transport Services or that they will be error free. 6. I fully understand that traveling by vehicle involves risks and dangers of serious bodily injury, including permanent disability, paralysis, and death. I agree, on behalf of myself and on behalf of any minor child using the Transport Services for whom I am the parent or legal guardian, that the entire risk arising out of my use of the [employer] remains solely with me, to the maximum extent permitted under applicable law. 7. The [employer] are provided "as is" and "as available." [facility] disclaims all representations and warranties, express, implied or statutory, not expressly set out in these terms, including the implied warranties of merchantability and fitness for a particular purpose. 8. I hereby waive and release [facility], its agents, employees, officers, directors, representatives, insurers, attorneys, assigns, successors, subsidiaries, and affiliates from any and all past, present, or future claims, demands, liabilities, actions, causes of action, or suits of any kind directly or indirectly arising from acceptance and use of the [employer]. 9. I further waive and release [facility] and its affiliates from all present and future liability and responsibility for any injury or death to persons or damages to property caused by or related to the use of the [employer]. 10. I have read this Waiver and Release of Liability, and I understand the terms used in it and their legal significance. This Waiver is freely and voluntarily given with the understanding that my right (as well as the right of any minor child for whom I am the parent or legal guardian using the [employer]) to legal recourse against [facility] in connection with the [employer] is knowingly surrendered in return for use of these services.   I attest that I read the consent document to Tammy Ball, gave Ms. Wamboldt the opportunity to ask questions and answered the questions asked (if any). I affirm that Tammy Ball then provided consent for she's participation in this program.     Legrand Pitts

## 2020-11-21 NOTE — Patient Outreach (Addendum)
[facility]) Care Management  11/21/2020  Tammy Ball [birth year] [id number]  CSW just received urgent referral to assist pt with a ride home from PCP office tomorrow.  CSW made contact with pt and confirmed her identity.  CSW introduced self, role and reason for call; transportation needs. Per pt, she is in need of a ride home tomorrow from  her PCP visit.  "My son can take me but can't stay or pick me up".  CSW asked pt what her plan was and she reports, "I guess to take a taxi home".  CSW advised pt that we may be able to assist however with it being last minute unsure.  CSW has placed referral via email to [employer] and called to make them aware of the last minute need.  Awaiting their call back with coordination updates.   Eduard Clos, MSW, [name] Worker  [place] [phone]

## 2020-11-22 ENCOUNTER — Other Ambulatory Visit: Payer: Self-pay | Admitting: *Deleted

## 2020-11-22 ENCOUNTER — Other Ambulatory Visit: Payer: Self-pay

## 2020-11-22 ENCOUNTER — Ambulatory Visit (INDEPENDENT_AMBULATORY_CARE_PROVIDER_SITE_OTHER): Payer: Medicare Other | Admitting: Emergency Medicine

## 2020-11-22 ENCOUNTER — Encounter: Payer: Self-pay | Admitting: Emergency Medicine

## 2020-11-22 VITALS — BP 134/79 | HR 63 | Temp 98.2°F | Resp 16 | Ht 63.5 in | Wt 185.0 lb

## 2020-11-22 DIAGNOSIS — J189 Pneumonia, unspecified organism: Secondary | ICD-10-CM | POA: Diagnosis not present

## 2020-11-22 DIAGNOSIS — N1831 Chronic kidney disease, stage 3a: Secondary | ICD-10-CM | POA: Diagnosis not present

## 2020-11-22 DIAGNOSIS — I5032 Chronic diastolic (congestive) heart failure: Secondary | ICD-10-CM | POA: Diagnosis not present

## 2020-11-22 DIAGNOSIS — Z09 Encounter for follow-up examination after completed treatment for conditions other than malignant neoplasm: Secondary | ICD-10-CM

## 2020-11-22 DIAGNOSIS — C3491 Malignant neoplasm of unspecified part of right bronchus or lung: Secondary | ICD-10-CM

## 2020-11-22 DIAGNOSIS — J449 Chronic obstructive pulmonary disease, unspecified: Secondary | ICD-10-CM

## 2020-11-22 NOTE — Patient Instructions (Addendum)
If you have lab work done today you will be contacted with your lab results within the next 2 weeks.  If you have not heard from [place] then please contact us. The fastest way to get your results is to register for My Chart.   IF you received an x-ray today, you will receive an invoice from [facility]. Please contact [facility] at [phone] with questions or concerns regarding your invoice.   IF you received labwork today, you will receive an invoice from [facility]. Please contact [facility] at [phone] with questions or concerns regarding your invoice.   Our billing staff will not be able to assist you with questions regarding bills from these companies.  You will be contacted with the lab results as soon as they are available. The fastest way to get your results is to activate your My Chart account. Instructions are located on the last page of this paperwork. If you have not heard from [place] regarding the results in 2 weeks, please contact this office.     Chronic Obstructive Pulmonary Disease Exacerbation Chronic obstructive pulmonary disease (COPD) is a long-term (chronic) lung problem. In COPD, the flow of air from the lungs is limited. COPD exacerbations are times that breathing gets worse and you need more than your normal treatment. Without treatment, they can be life threatening. If they happen often, your lungs can become more damaged. If your COPD gets worse, your doctor may treat you with:  Medicines.  Oxygen.  Different ways to clear your airway, such as using a mask. Follow these instructions at home: Medicines  Take over-the-counter and prescription medicines only as told by your doctor.  If you take an antibiotic or steroid medicine, do not stop taking the medicine even if you start to feel better.  Keep up with shots (vaccinations) as told by your doctor. Be sure to get a yearly (annual) flu shot. Lifestyle  Do not smoke. If you need help  quitting, ask your doctor.  Eat healthy foods.  Exercise regularly.  Get plenty of sleep.  Avoid tobacco smoke and other things that can bother your lungs.  Wash your hands often with soap and water. This will help keep you from getting an infection. If you cannot use soap and water, use hand sanitizer.  During flu season, avoid areas that are crowded with people. General instructions  Drink enough fluid to keep your pee (urine) clear or pale yellow. Do not do this if your doctor has told you not to.  Use a cool mist machine (vaporizer).  If you use oxygen or a machine that turns medicine into a mist (nebulizer), continue to use it as told.  Follow all instructions for rehabilitation. These are steps you can take to make your body work better.  Keep all follow-up visits as told by your doctor. This is important. Contact a doctor if:  Your COPD symptoms get worse than normal. Get help right away if:  You are short of breath and it gets worse.  You have trouble talking.  You have chest pain.  You cough up blood.  You have a fever.  You keep throwing up (vomiting).  You feel weak or you pass out (faint).  You feel confused.  You are not able to sleep because of your symptoms.  You are not able to do daily activities. Summary  COPD exacerbations are times that breathing gets worse and you need more treatment than normal.  COPD exacerbations can be  very serious and may cause your lungs to become more damaged.  Do not smoke. If you need help quitting, ask your doctor.  Stay up-to-date on your shots. Get a flu shot every year. This information is not intended to replace advice given to you by your health care provider. Make sure you discuss any questions you have with your health care provider. Document Revised: 10/11/2017 Document Reviewed: 12/03/2016 Elsevier Patient Education  2021 [employer].

## 2020-11-22 NOTE — Patient Outreach (Signed)
[name]) Care Management  11/22/2020  Tammy Ball [birth year] [id number]  CSW spoke with pt who reports she did receive word from the Transportation services line that they can assist with getting her home from PCP visit today.  Pt is now stating a friend has shown up and can provide that ride. Pt plans to call Transportation line to advise and cancel request.   CSW will sign off.   Eduard Clos, MSW, [place] Worker  [place] [phone]

## 2020-11-22 NOTE — Progress Notes (Signed)
Tammy Ball 80 y.o.   Chief Complaint  Patient presents with  . Hospitalization Follow-up    Pneumonia, per patient she is feeling better    HISTORY OF PRESENT ILLNESS: This is a 80 y.o. female here for hospital discharge follow-up.  Admitted on 11/10/2020 and discharged on 11/14/2020 with a diagnosis of community-acquired pneumonia with COPD exacerbation.  Hospital discharge summary as follows: Physician Discharge Summary  SARAH BAEZ TDV:[id number] DOB: [birth year] DOA: 11/10/2020  PCP: Forrest Moron, MD  Admit date: 11/10/2020 Discharge date: 11/14/2020  Discharge disposition: Home with home health physical therapy            Recommendations for Outpatient Follow-Up:   Follow-up with PCP 1 week  Discharge Diagnosis:   Principal Problem:   CAP (community acquired pneumonia) Active Problems:   Essential hypertension   Hyperlipidemia   Non-small cell carcinoma of right lung, stage 2 (HCC)   Chronic respiratory failure with hypoxia (HCC)   CKD (chronic kidney disease) stage 3, GFR 30-59 ml/min (HCC)   COPD with acute exacerbation ([place])   Chronic diastolic CHF (congestive heart failure) ([place])    Discharge Condition: Stable.  Diet recommendation:     Diet Order                  Diet - low sodium heart healthy            Diet Heart Room service appropriate? Yes; Fluid consistency: Thin  Diet effective now                    Code Status: Full Code     Hospital Course:   Ms. Tammy Ball is a 80 year old woman with past medical history significant for COPD, chronic hypoxic respiratory failure (has not used oxygen for about a year), chronic diastolic CHF, hypertension, hyperlipidemia, non-small cell lung cancer status post right upper lobectomy and chemoradiation, renal cell carcinoma status post left nephrectomy.  She presented to the hospital with flulike symptoms over 1 week duration.  She complained of fever with temperature  up to 102 F, purulent cough productive of bloody sputum, sore throat, diarrhea and abdominal discomfort.  She initially presented to the emergency department on 11/05/2020 and at that time she was found to have leukocytosis and possible lung consolidation.  She was discharged home on doxycycline since she wanted to go home and spend Christmas with her family.  However, despite taking doxycycline, her condition did not improve.  She noted she was becoming hypoxic and was having to use her home oxygen.  She presented to the hospital again on 11/10/2020 and she was found to have Left lower lobe pneumonia.  She was treated with empiric IV antibiotics.  She was also requiring 3 L/min oxygen via nasal cannula for acute on chronic hypoxic respiratory failure.  She was given steroids and bronchodilators for COPD exacerbation.  Her condition slowly improved and she was deemed stable for discharge to home today.  Today she feels a lot better.  Finished antibiotics.  Still using home oxygen as needed.  Taking COPD medications as usual. Has no complaints or medical concerns today.  HPI   Prior to Admission medications   Medication Sig Start Date End Date Taking? Authorizing Provider  acetaminophen (TYLENOL) 500 MG tablet Take 500 mg by mouth every 6 (six) hours as needed for mild pain, moderate pain, fever or headache. Reported on 05/24/2016   Yes [provider]  albuterol (PROVENTIL) (2.5 MG/3ML) 0.083% nebulizer  solution Take 3 mLs (2.5 mg total) by nebulization every 6 (six) hours as needed for wheezing or shortness of breath. 12/30/18  Yes Rai, Ripudeep K, MD  albuterol (VENTOLIN HFA) 108 (90 Base) MCG/ACT inhaler Inhale 1-2 puffs into the lungs every 6 (six) hours as needed for wheezing or shortness of breath. 02/13/19  Yes Forrest Moron, MD  Ascorbic Acid (VITAMIN C) 1000 MG tablet Take 1,000 mg by mouth daily.   Yes [provider]  aspirin EC 81 MG tablet Take 81 mg by mouth daily.    Yes [provider]  atorvastatin (LIPITOR) 40 MG tablet TAKE 1 TABLET BY MOUTH EVERY DAY Patient taking differently: Take 40 mg by mouth daily. 01/18/20  Yes Forrest Moron, MD  calcium carbonate (TUMS EX) 750 MG chewable tablet Chew 1 tablet by mouth daily as needed for heartburn.   Yes [provider]  Carboxymethylcellulose Sodium (ARTIFICIAL TEARS OP) Place 1 drop into both eyes daily as needed (dry eyes).   Yes [provider]  carvedilol (COREG) 12.5 MG tablet Take 12.5 mg by mouth 2 (two) times daily. 10/12/20  Yes [provider]  Cholecalciferol (VITAMIN D3 PO) Take 1,200 mg by mouth daily.   Yes [provider]  fish oil-omega-3 fatty acids 1000 MG capsule Take 1 capsule by mouth daily.   Yes [provider]  fluticasone (FLONASE) 50 MCG/ACT nasal spray Place 2 sprays into both nostrils daily.   Yes [provider]  Guaifenesin (MUCINEX MAXIMUM STRENGTH) 1200 MG TB12 Take 1,200 mg by mouth 2 (two) times daily.   Yes [provider]  losartan (COZAAR) 100 MG tablet Take 1 tablet (100 mg total) by mouth daily. 11/15/20  Yes Vika Buske, Ines Bloomer, MD  ondansetron (ZOFRAN) 4 MG tablet Take 1 tablet (4 mg total) by mouth every 6 (six) hours. Patient taking differently: Take 4 mg by mouth every 8 (eight) hours as needed for nausea or vomiting. 11/05/20  Yes Curatolo, Adam, DO  sodium chloride (OCEAN) 0.65 % SOLN nasal spray Place 1 spray into both nostrils daily.   Yes [provider]  traMADol (ULTRAM) 50 MG tablet Take 1 tablet (50 mg total) by mouth every 6 (six) hours as needed (BACK PAIN). 06/01/19  Yes Stallings, Zoe A, MD  TRELEGY ELLIPTA 100-62.5-25 MCG/INH AEPB INHALE 1 PUFF BY MOUTH EVERY DAY Patient taking differently: Inhale 1 puff into the lungs daily. 04/21/20  Yes Collene Gobble, MD  OXYGEN Inhale 2 L into the lungs continuous.    [provider]    Allergies  Allergen Reactions  . Bee  Venom Anaphylaxis, Shortness Of Breath, Swelling and Other (See Comments)    Swelling at site   . Amlodipine Swelling and Other (See Comments)    Swelling of the ankles and hands   . Levofloxacin Other (See Comments)    Joint pain   . Hctz [Hydrochlorothiazide] Palpitations and Other (See Comments)    Sweating     Patient Active Problem List   Diagnosis Date Noted  . COPD with acute exacerbation ([place])   . Chronic diastolic CHF (congestive heart failure) ([place])   . CAP (community acquired pneumonia) 11/10/2020  . CKD (chronic kidney disease) stage 3, GFR 30-59 ml/min (HCC) 11/10/2020  . HCAP (healthcare-associated pneumonia) 01/07/2019  . Allergic rhinitis 01/06/2019  . Chronic respiratory failure with hypoxia ([place]) 12/26/2018  . Age-related osteoporosis with current pathological fracture 11/21/2018  . Renal insufficiency 05/20/2018  . History of compression  fracture of spine 12/10/2016  . Hypercalcemia 06/25/2016  . Insomnia 05/16/2016  . Pneumonitis, radiation ([place]) 04/11/2016  . S/P lobectomy of lung 01/18/2016  . Chemotherapy-induced neuropathy ([place]) 11/01/2015  . Non-small cell carcinoma of right lung, stage 2 ([place]) 10/03/2015  . Hyperlipidemia 08/14/2013  . Spondylolisthesis of lumbar region 07/03/2013  . Essential hypertension 12/17/2011  . COPD GOLD II  12/17/2011  . Renal cell cancer ([place]) 12/17/2011  . Sciatica of left side 12/17/2011  . Depression 12/17/2011    Past Medical History:  Diagnosis Date  . Anemia    was while doing chemo  . Anxiety   . Asthma   . Chemotherapy-induced neuropathy ([place]) 11/01/2015  . Claustrophobia   . COPD (chronic obstructive pulmonary disease) ([place])   . Depression   . Encounter for antineoplastic chemotherapy 02/09/2016  . Glaucoma   . Headache    prior to menopause  . Heart murmur   . History of echocardiogram    Echo 2/17: EF 93-23%, grade 1 diastolic dysfunction, mild MR, trivial pericardial effusion  . History of nuclear  stress test    Myoview 2/17: no ischemia or scar, EF 79%; low risk  . History of radiation therapy 10/30/16-11/06/16  . Hyperlipidemia   . Hypertension   . Insomnia 05/16/2016  . Non-small cell carcinoma of right lung, stage 2 ([place]) 10/03/2015  . Radiation 02/29/16-04/10/16   50.4 Gy to right central chest  . Renal cell carcinoma ([place])    L nephrectomy  in 2012    Past Surgical History:  Procedure Laterality Date  . BACK SURGERY     cervical 1991  . EYE SURGERY    . IR GENERIC HISTORICAL  01/16/2017   IR RADIOLOGIST EVAL & MGMT 01/16/2017 [facility]  . IR GENERIC HISTORICAL  01/31/2017   IR FLUORO GUIDED NEEDLE PLC ASPIRATION/INJECTION LOC 01/31/2017 Luanne Bras, MD [facility]  . IR GENERIC HISTORICAL  01/31/2017   IR VERTEBROPLASTY CERV/THOR BX INC UNI/BIL INC/INJECT/IMAGING 01/31/2017 Luanne Bras, MD [facility]  . IR GENERIC HISTORICAL  01/31/2017   IR VERTEBROPLASTY EA ADDL (T&LS) BX INC UNI/BIL INC INJECT/IMAGING 01/31/2017 Luanne Bras, MD [facility]  . kidney cancer    . NEPHRECTOMY    . SPINE SURGERY    . TUBAL LIGATION    . VIDEO ASSISTED THORACOSCOPY (VATS)/WEDGE RESECTION Right 01/18/2016   Procedure: VIDEO ASSISTED THORACOSCOPY (VATS)/LUNG RESECTION, THOROCOTOMY, RIGHT UPPER LOBECTOMY, LYMPH NODE DISSECTION, PLACEMENT OF ON Q;  Surgeon: [name], MD;  Location: [facility];  Service: Thoracic;  Laterality: Right;  Marland Kitchen VIDEO BRONCHOSCOPY Bilateral 09/20/2015   Procedure: VIDEO BRONCHOSCOPY WITHOUT FLUORO;  Surgeon: Rigoberto Noel, MD;  Location: [facility];  Service: Cardiopulmonary;  Laterality: Bilateral;  . VIDEO BRONCHOSCOPY N/A 01/18/2016   Procedure: VIDEO BRONCHOSCOPY;  Surgeon: [name], MD;  Location: [facility] OR;  Service: Thoracic;  Laterality: N/A;    Social History   Socioeconomic History  . Marital status: Widowed    Spouse name: Not on file  . Number of children: 4  . Years of education: Not on file  . Highest education level: High  school graduate  Occupational History  . Not on file  Tobacco Use  . Smoking status: Former Smoker    Packs/day: 1.00    Years: 50.00    Pack years: 50.00    Types: Cigarettes    Quit date: 03/16/2011    Years since quitting: 9.6  . Smokeless tobacco: Never Used  . Tobacco comment: last  4-5 years of smoking, smoked 0.5 pack/day   Vaping Use  . Vaping Use: Never used  Substance and Sexual Activity  . Alcohol use: No    Alcohol/week: 0.0 standard drinks  . Drug use: No  . Sexual activity: Not Currently  Other Topics Concern  . Not on file  Social History Narrative   Marital status: divorced; not dating in 2019.      Children: 4 children; 3 grandchildren adult; 4 gg.      Lives: alone in house      Employment: part-time work; substance abuse counselor; [employer].      Tobacco: quit smoking 2012; smoked 45 years      Alcohol: none      Drugs: none      Exercise: none in 2019; due to LEFT sciatica.      ADLs: independent with ADLs; drives.       Advanced Directives: YES: HCPOA: Nicholas Martinez/son.  FULL CODE but no prolonged measures.      Occupation: Substance Abuse Estate agent   No exercise** Merged History Encounter **       ** Data from: 12/14/11 Enc Dept: [facility]       ** Data from: 12/17/11 Enc Dept: [facility]   Substance abuse counselor   Husband deceased   4 great grandchildren   Son works in same substance abuse counseling center as patient   Social Determinants of Radio broadcast assistant Strain: Low Risk   . Difficulty of Paying Living Expenses: Not hard at all  Food Insecurity: No Food Insecurity  . Worried About Charity fundraiser in the Last Year: Never true  . Ran Out of Food in the Last Year: Never true  Transportation Needs: No Transportation Needs  . Lack of Transportation (Medical): No  . Lack of Transportation (Non-Medical): No  Physical Activity: Inactive  . Days of Exercise per  Week: 0 days  . Minutes of Exercise per Session: 0 min  Stress: Not on file  Social Connections: Not on file  Intimate Partner Violence: Not on file    Family History  Problem Relation Age of Onset  . Heart disease Sister   . Obesity Brother   . Heart attack Daughter 25       s/p CABG  . Glaucoma Daughter   . Breast cancer Sister   . Birth defects Sister   . Cancer Mother        Bladder Cancer  . Hypertension Mother   . CAD Mother 41  . Cancer Maternal Grandmother      Review of Systems  Constitutional: Negative.  Negative for chills and fever.  HENT: Negative.  Negative for congestion and sore throat.   Respiratory: Negative.  Negative for cough and shortness of breath.   Cardiovascular: Negative.  Negative for chest pain and palpitations.  Gastrointestinal: Negative.  Negative for abdominal pain, diarrhea, nausea and vomiting.  Genitourinary: Negative.  Negative for dysuria.  Musculoskeletal: Negative.  Negative for myalgias and neck pain.  Skin: Negative.  Negative for rash.  Neurological: Negative.  Negative for dizziness and headaches.  All other systems reviewed and are negative.  Today's Vitals   11/22/20 1114  BP: 134/79  Pulse: 63  Resp: 16  Temp: 98.2 F (36.8 C)  TempSrc: Temporal  SpO2: 95%  Weight: 185 lb (83.9 kg)  Height: 5' 3.5" (1.613 m)   Body mass index is  32.26 kg/m.   Physical Exam Vitals reviewed.  Constitutional:      Appearance: Normal appearance.  HENT:     Head: Normocephalic.  Eyes:     Extraocular Movements: Extraocular movements intact.     Pupils: Pupils are equal, round, and reactive to light.  Cardiovascular:     Rate and Rhythm: Normal rate and regular rhythm.     Pulses: Normal pulses.     Heart sounds: Normal heart sounds.  Pulmonary:     Effort: Pulmonary effort is normal.     Breath sounds: Rhonchi present. No wheezing or rales.  Musculoskeletal:     Cervical back: Normal range of motion and neck supple.   Skin:    General: Skin is warm and dry.     Capillary Refill: Capillary refill takes less than 2 seconds.  Neurological:     General: No focal deficit present.     Mental Status: She is alert and oriented to person, place, and time.  Psychiatric:        Mood and Affect: Mood normal.        Behavior: Behavior normal.      ASSESSMENT & PLAN: Zauria was seen today for hospitalization follow-up.  Diagnoses and all orders for this visit:  Hospital discharge follow-up  COPD GOLD II   Chronic diastolic CHF (congestive heart failure) (HCC)  Stage 3a chronic kidney disease ([place])  Non-small cell carcinoma of right lung, stage 2 ([place])  Community acquired pneumonia of left lower lobe of lung Comments: Much improved    Patient Instructions       If you have lab work done today you will be contacted with your lab results within the next 2 weeks.  If you have not heard from [place] then please contact us. The fastest way to get your results is to register for My Chart.   IF you received an x-ray today, you will receive an invoice from [facility]. Please contact [facility] at [phone] with questions or concerns regarding your invoice.   IF you received labwork today, you will receive an invoice from [facility]. Please contact [facility] at [phone] with questions or concerns regarding your invoice.   Our billing staff will not be able to assist you with questions regarding bills from these companies.  You will be contacted with the lab results as soon as they are available. The fastest way to get your results is to activate your My Chart account. Instructions are located on the last page of this paperwork. If you have not heard from [place] regarding the results in 2 weeks, please contact this office.     Chronic Obstructive Pulmonary Disease Exacerbation Chronic obstructive pulmonary disease (COPD) is a long-term (chronic) lung problem. In COPD, the flow of air  from the lungs is limited. COPD exacerbations are times that breathing gets worse and you need more than your normal treatment. Without treatment, they can be life threatening. If they happen often, your lungs can become more damaged. If your COPD gets worse, your doctor may treat you with:  Medicines.  Oxygen.  Different ways to clear your airway, such as using a mask. Follow these instructions at home: Medicines  Take over-the-counter and prescription medicines only as told by your doctor.  If you take an antibiotic or steroid medicine, do not stop taking the medicine even if you start to feel better.  Keep up with shots (vaccinations) as told by your doctor. Be sure to get a yearly (annual)  flu shot. Lifestyle  Do not smoke. If you need help quitting, ask your doctor.  Eat healthy foods.  Exercise regularly.  Get plenty of sleep.  Avoid tobacco smoke and other things that can bother your lungs.  Wash your hands often with soap and water. This will help keep you from getting an infection. If you cannot use soap and water, use hand sanitizer.  During flu season, avoid areas that are crowded with people. General instructions  Drink enough fluid to keep your pee (urine) clear or pale yellow. Do not do this if your doctor has told you not to.  Use a cool mist machine (vaporizer).  If you use oxygen or a machine that turns medicine into a mist (nebulizer), continue to use it as told.  Follow all instructions for rehabilitation. These are steps you can take to make your body work better.  Keep all follow-up visits as told by your doctor. This is important. Contact a doctor if:  Your COPD symptoms get worse than normal. Get help right away if:  You are short of breath and it gets worse.  You have trouble talking.  You have chest pain.  You cough up blood.  You have a fever.  You keep throwing up (vomiting).  You feel weak or you pass out (faint).  You feel  confused.  You are not able to sleep because of your symptoms.  You are not able to do daily activities. Summary  COPD exacerbations are times that breathing gets worse and you need more treatment than normal.  COPD exacerbations can be very serious and may cause your lungs to become more damaged.  Do not smoke. If you need help quitting, ask your doctor.  Stay up-to-date on your shots. Get a flu shot every year. This information is not intended to replace advice given to you by your health care provider. Make sure you discuss any questions you have with your health care provider. Document Revised: 10/11/2017 Document Reviewed: 12/03/2016 Elsevier Patient Education  2021 Elsevier Inc.      Agustina Caroli, MD [facility]

## 2020-11-23 DIAGNOSIS — I130 Hypertensive heart and chronic kidney disease with heart failure and stage 1 through stage 4 chronic kidney disease, or unspecified chronic kidney disease: Secondary | ICD-10-CM | POA: Diagnosis not present

## 2020-11-23 DIAGNOSIS — I272 Pulmonary hypertension, unspecified: Secondary | ICD-10-CM | POA: Diagnosis not present

## 2020-11-23 DIAGNOSIS — J9610 Chronic respiratory failure, unspecified whether with hypoxia or hypercapnia: Secondary | ICD-10-CM | POA: Diagnosis not present

## 2020-11-23 DIAGNOSIS — J9819 Other pulmonary collapse: Secondary | ICD-10-CM | POA: Diagnosis not present

## 2020-11-23 DIAGNOSIS — J181 Lobar pneumonia, unspecified organism: Secondary | ICD-10-CM | POA: Diagnosis not present

## 2020-11-23 DIAGNOSIS — J439 Emphysema, unspecified: Secondary | ICD-10-CM | POA: Diagnosis not present

## 2020-11-24 ENCOUNTER — Ambulatory Visit: Payer: Self-pay | Admitting: *Deleted

## 2020-11-25 DIAGNOSIS — J181 Lobar pneumonia, unspecified organism: Secondary | ICD-10-CM | POA: Diagnosis not present

## 2020-11-25 DIAGNOSIS — J9819 Other pulmonary collapse: Secondary | ICD-10-CM | POA: Diagnosis not present

## 2020-11-25 DIAGNOSIS — I130 Hypertensive heart and chronic kidney disease with heart failure and stage 1 through stage 4 chronic kidney disease, or unspecified chronic kidney disease: Secondary | ICD-10-CM | POA: Diagnosis not present

## 2020-11-25 DIAGNOSIS — I272 Pulmonary hypertension, unspecified: Secondary | ICD-10-CM | POA: Diagnosis not present

## 2020-11-25 DIAGNOSIS — J439 Emphysema, unspecified: Secondary | ICD-10-CM | POA: Diagnosis not present

## 2020-11-25 DIAGNOSIS — J9610 Chronic respiratory failure, unspecified whether with hypoxia or hypercapnia: Secondary | ICD-10-CM | POA: Diagnosis not present

## 2020-11-28 ENCOUNTER — Other Ambulatory Visit: Payer: Self-pay | Admitting: *Deleted

## 2020-11-28 NOTE — Patient Outreach (Signed)
[facility]) Care Management  11/28/2020  Tammy Ball 03-04-1941 [id number]   Weekly transition of care call placed to member, state she is doing much better.  Was seen by her PCP on 1/11, report office visit went well.  She is taking her Carvedilol again as prescribed with the exception of episodes of hypotension.  Blood pressure today was 108/69, oxygen level range 93-95%.  Report weight has remained stable at 185 pounds.  She has labs and chest CT scheduled for Friday, follow up with oncology on 1/24.  Still does not have appointment with pulmonology but will call tomorrow to schedule.  Denies any urgent concerns, encouraged to contact this care manager with questions.  Agrees to follow up within the next week.  Goals Addressed            This Visit's Progress   . [facility]) Learn More About My Health   On track    Timeframe:  Short-Term Goal Priority:  Medium Start Date: 11/17/2020                            Expected End Date: 12/17/2020                       Follow Up Date 11/28/2020    - tell my story and reason for my visit - make a list of questions - ask questions - repeat what I heard to make sure I understand - bring a list of my medicines to the visit - speak up when I don't understand    Why is this important?    The best way to learn about your health and care is by talking to the doctor and nurse.   They will answer your questions and give you information in the way that you like best.    Notes:     . [facility]) Make and Keep All Appointments   On track    Timeframe:  Long-Range Goal Priority:  High Start Date:   11/17/2020                          Expected End Date:   02/09/21                    Follow Up Date 11/28/20    - ask family or friend for a ride - call to cancel if needed - keep a calendar with appointment dates  -review list of transportation options mailed out to you -schedule Pulmonary follow up appointment within the next 2 weeks    Why is this important?    Part of staying healthy is seeing the doctor for follow-up care.   If you forget your appointments, there are some things you can do to stay on track.    Notes:   1/10 - Referral to CSW for transportation resources    . [facility]) Manage My Medicine   On track    Timeframe:  Short-Term Goal Priority:  High Start Date:  11/17/2020                           Expected End Date:  01/09/21                     Follow Up Date 11/28/20   - call for medicine refill 2 or 3  days before it runs out - call if I am sick and can't take my medicine - keep a list of all the medicines I take; vitamins and herbals too - use a pillbox to sort medicine  -work with [place] for medication assistance for Trelegy -discuss Losartan medication with primary care provider   Why is this important?   . These steps will help you keep on track with your medicines.   Notes:     . [facility]) Track and Manage My Symptoms-COPD   On track    Timeframe:  Long-Range Goal Priority:  Medium Start Date:   11/17/20                          Expected End Date:  05/11/21                     Follow Up Date 11/28/20    - begin a symptom diary - bring symptom diary to all visits - develop a rescue plan - eliminate symptom triggers at home - follow rescue plan if symptoms flare-up - keep follow-up appointments  -continue to monitor blood pressures daily -take blood pressure log to PCP appointment for provider review   Why is this important?    Tracking your symptoms and other information about your health helps your doctor plan your care.   Write down the symptoms, the time of day, what you were doing and what medicine you are taking.   You will soon learn how to manage your symptoms.     Notes:   1/10 - Reminded to monitor and record weight, blood pressure, and oxygen levels daily      Valente David, Therapist, sports, [facility] [place] [phone]

## 2020-11-30 DIAGNOSIS — J9819 Other pulmonary collapse: Secondary | ICD-10-CM | POA: Diagnosis not present

## 2020-11-30 DIAGNOSIS — I272 Pulmonary hypertension, unspecified: Secondary | ICD-10-CM | POA: Diagnosis not present

## 2020-11-30 DIAGNOSIS — I130 Hypertensive heart and chronic kidney disease with heart failure and stage 1 through stage 4 chronic kidney disease, or unspecified chronic kidney disease: Secondary | ICD-10-CM | POA: Diagnosis not present

## 2020-11-30 DIAGNOSIS — J439 Emphysema, unspecified: Secondary | ICD-10-CM | POA: Diagnosis not present

## 2020-11-30 DIAGNOSIS — J9610 Chronic respiratory failure, unspecified whether with hypoxia or hypercapnia: Secondary | ICD-10-CM | POA: Diagnosis not present

## 2020-11-30 DIAGNOSIS — J181 Lobar pneumonia, unspecified organism: Secondary | ICD-10-CM | POA: Diagnosis not present

## 2020-12-02 ENCOUNTER — Other Ambulatory Visit: Payer: Self-pay

## 2020-12-02 ENCOUNTER — Inpatient Hospital Stay: Payer: Medicare Other | Attending: Internal Medicine

## 2020-12-02 ENCOUNTER — Ambulatory Visit (HOSPITAL_COMMUNITY)
Admission: RE | Admit: 2020-12-02 | Discharge: 2020-12-02 | Disposition: A | Discharge status: Home / Self Care | Payer: Medicare Other | Source: Ambulatory Visit | Attending: Internal Medicine | Admitting: Internal Medicine

## 2020-12-02 DIAGNOSIS — I700 Atherosclerosis of aorta: Secondary | ICD-10-CM | POA: Insufficient documentation

## 2020-12-02 DIAGNOSIS — Z79899 Other long term (current) drug therapy: Secondary | ICD-10-CM | POA: Insufficient documentation

## 2020-12-02 DIAGNOSIS — J181 Lobar pneumonia, unspecified organism: Secondary | ICD-10-CM | POA: Diagnosis not present

## 2020-12-02 DIAGNOSIS — C3490 Malignant neoplasm of unspecified part of unspecified bronchus or lung: Secondary | ICD-10-CM

## 2020-12-02 DIAGNOSIS — J439 Emphysema, unspecified: Secondary | ICD-10-CM | POA: Diagnosis not present

## 2020-12-02 DIAGNOSIS — Z923 Personal history of irradiation: Secondary | ICD-10-CM | POA: Diagnosis not present

## 2020-12-02 DIAGNOSIS — E785 Hyperlipidemia, unspecified: Secondary | ICD-10-CM | POA: Diagnosis not present

## 2020-12-02 DIAGNOSIS — J9819 Other pulmonary collapse: Secondary | ICD-10-CM | POA: Diagnosis not present

## 2020-12-02 DIAGNOSIS — I130 Hypertensive heart and chronic kidney disease with heart failure and stage 1 through stage 4 chronic kidney disease, or unspecified chronic kidney disease: Secondary | ICD-10-CM | POA: Diagnosis not present

## 2020-12-02 DIAGNOSIS — F418 Other specified anxiety disorders: Secondary | ICD-10-CM | POA: Diagnosis not present

## 2020-12-02 DIAGNOSIS — Z85118 Personal history of other malignant neoplasm of bronchus and lung: Secondary | ICD-10-CM | POA: Insufficient documentation

## 2020-12-02 DIAGNOSIS — R011 Cardiac murmur, unspecified: Secondary | ICD-10-CM | POA: Insufficient documentation

## 2020-12-02 DIAGNOSIS — J449 Chronic obstructive pulmonary disease, unspecified: Secondary | ICD-10-CM | POA: Diagnosis not present

## 2020-12-02 DIAGNOSIS — I10 Essential (primary) hypertension: Secondary | ICD-10-CM | POA: Diagnosis not present

## 2020-12-02 DIAGNOSIS — Z9221 Personal history of antineoplastic chemotherapy: Secondary | ICD-10-CM | POA: Diagnosis not present

## 2020-12-02 DIAGNOSIS — I272 Pulmonary hypertension, unspecified: Secondary | ICD-10-CM | POA: Diagnosis not present

## 2020-12-02 DIAGNOSIS — C3411 Malignant neoplasm of upper lobe, right bronchus or lung: Secondary | ICD-10-CM | POA: Diagnosis not present

## 2020-12-02 DIAGNOSIS — Z7982 Long term (current) use of aspirin: Secondary | ICD-10-CM | POA: Insufficient documentation

## 2020-12-02 DIAGNOSIS — Z85528 Personal history of other malignant neoplasm of kidney: Secondary | ICD-10-CM | POA: Diagnosis not present

## 2020-12-02 DIAGNOSIS — S22050A Wedge compression fracture of T5-T6 vertebra, initial encounter for closed fracture: Secondary | ICD-10-CM | POA: Diagnosis not present

## 2020-12-02 DIAGNOSIS — J9610 Chronic respiratory failure, unspecified whether with hypoxia or hypercapnia: Secondary | ICD-10-CM | POA: Diagnosis not present

## 2020-12-02 LAB — CBC WITH DIFFERENTIAL (CANCER CENTER ONLY)
Abs Immature Granulocytes: 0.03 10*3/uL (ref 0.00–0.07)
Basophils Absolute: 0.1 10*3/uL (ref 0.0–0.1)
Basophils Relative: 1 %
Eosinophils Absolute: 0.2 10*3/uL (ref 0.0–0.5)
Eosinophils Relative: 4 %
HCT: 36.5 % (ref 36.0–46.0)
Hemoglobin: 12 g/dL (ref 12.0–15.0)
Immature Granulocytes: 0 %
Lymphocytes Relative: 21 %
Lymphs Abs: 1.4 10*3/uL (ref 0.7–4.0)
MCH: 30.5 pg (ref 26.0–34.0)
MCHC: 32.9 g/dL (ref 30.0–36.0)
MCV: 92.6 fL (ref 80.0–100.0)
Monocytes Absolute: 0.6 10*3/uL (ref 0.1–1.0)
Monocytes Relative: 9 %
Neutro Abs: 4.4 10*3/uL (ref 1.7–7.7)
Neutrophils Relative %: 65 %
Platelet Count: 192 10*3/uL (ref 150–400)
RBC: 3.94 MIL/uL (ref 3.87–5.11)
RDW: 17.2 % — ABNORMAL HIGH (ref 11.5–15.5)
WBC Count: 6.7 10*3/uL (ref 4.0–10.5)
nRBC: 0 % (ref 0.0–0.2)

## 2020-12-02 LAB — CMP (CANCER CENTER ONLY)
ALT: 22 U/L (ref 0–44)
AST: 21 U/L (ref 15–41)
Albumin: 3.9 g/dL (ref 3.5–5.0)
Alkaline Phosphatase: 81 U/L (ref 38–126)
Anion gap: 7 (ref 5–15)
BUN: 21 mg/dL (ref 8–23)
CO2: 30 mmol/L (ref 22–32)
Calcium: 9.7 mg/dL (ref 8.9–10.3)
Chloride: 100 mmol/L (ref 98–111)
Creatinine: 1.13 mg/dL — ABNORMAL HIGH (ref 0.44–1.00)
GFR, Estimated: 50 mL/min — ABNORMAL LOW (ref >60)
Glucose, Bld: 101 mg/dL — ABNORMAL HIGH (ref 70–99)
Potassium: 5.3 mmol/L — ABNORMAL HIGH (ref 3.5–5.1)
Sodium: 137 mmol/L (ref 135–145)
Total Bilirubin: 0.7 mg/dL (ref 0.3–1.2)
Total Protein: 6.8 g/dL (ref 6.5–8.1)

## 2020-12-05 ENCOUNTER — Other Ambulatory Visit: Payer: Self-pay | Admitting: *Deleted

## 2020-12-05 ENCOUNTER — Other Ambulatory Visit: Payer: Self-pay

## 2020-12-05 ENCOUNTER — Inpatient Hospital Stay (HOSPITAL_BASED_OUTPATIENT_CLINIC_OR_DEPARTMENT_OTHER): Payer: Medicare Other | Admitting: Internal Medicine

## 2020-12-05 VITALS — BP 134/82 | HR 70 | Temp 99.0°F | Resp 19 | Ht 63.5 in | Wt 185.3 lb

## 2020-12-05 DIAGNOSIS — C3491 Malignant neoplasm of unspecified part of right bronchus or lung: Secondary | ICD-10-CM

## 2020-12-05 DIAGNOSIS — C3490 Malignant neoplasm of unspecified part of unspecified bronchus or lung: Secondary | ICD-10-CM

## 2020-12-05 DIAGNOSIS — I700 Atherosclerosis of aorta: Secondary | ICD-10-CM | POA: Diagnosis not present

## 2020-12-05 DIAGNOSIS — Z85118 Personal history of other malignant neoplasm of bronchus and lung: Secondary | ICD-10-CM | POA: Diagnosis not present

## 2020-12-05 DIAGNOSIS — R011 Cardiac murmur, unspecified: Secondary | ICD-10-CM | POA: Diagnosis not present

## 2020-12-05 DIAGNOSIS — F418 Other specified anxiety disorders: Secondary | ICD-10-CM | POA: Diagnosis not present

## 2020-12-05 DIAGNOSIS — J449 Chronic obstructive pulmonary disease, unspecified: Secondary | ICD-10-CM | POA: Diagnosis not present

## 2020-12-05 DIAGNOSIS — J439 Emphysema, unspecified: Secondary | ICD-10-CM | POA: Diagnosis not present

## 2020-12-05 NOTE — Patient Outreach (Signed)
[facility]) Care Management  12/05/2020  Tammy Ball Apr 20, 1941 [id number]   Weekly transition of care call placed to member.  State she is doing well with the exception of a scratchy throat the last couple days.  She has been treating and gargling, state it is now better.  Was seen by oncologist today, reportedly cancer is not growing, will continue to be monitored, follow up in 6 months.  State she has reached out to pulmonology office, they will call her back to schedule visit.  Continuing to wear her home O2 without issues, working with PT as well.    She is concerned about her blood pressure and medication (Losartan).  During office visit today, pressure was initially 177/77, retake was 134/82.  Report she is taking medication as instructed, concerned it by have a damaging effect to her kidney (she only has one).  She will continue to monitor pressure daily and will speak with PCP about concerns.  Noted she has PCP visit with [facility] in April, state she is no longer seen at that clinic and followed by Dr. Burnett Harry, associated with [facility], a mobile [facility].  Will have care management assistant check eligibility for [facility] program with this provider.    Denies any urgent concerns, encouraged to contact this care manager with questions.  Agrees to follow up within the next week.  Goals Addressed            This Visit's Progress   . [facility]) Learn More About My Health   On track    Timeframe:  Short-Term Goal Priority:  Medium Start Date: 11/17/2020                            Expected End Date: 12/17/2020                       Follow Up Date 11/28/2020    - tell my story and reason for my visit - make a list of questions - ask questions - repeat what I heard to make sure I understand - bring a list of my medicines to the visit - speak up when I don't understand    Why is this important?    The best way to learn about your health and care is by talking  to the doctor and nurse.   They will answer your questions and give you information in the way that you like best.    Notes:     . [facility]) Make and Keep All Appointments   On track    Timeframe:  Long-Range Goal Priority:  High Start Date:   11/17/2020                          Expected End Date:   02/09/21                    Follow Up Date 11/28/20    - ask family or friend for a ride - call to cancel if needed - keep a calendar with appointment dates  -review list of transportation options mailed out to you -schedule Pulmonary follow up appointment within the next 2 weeks   Why is this important?    Part of staying healthy is seeing the doctor for follow-up care.   If you forget your appointments, there are some things you can do  to stay on track.    Notes:   1/10 - Referral to CSW for transportation resources    . [facility]) Manage My Medicine   On track    Timeframe:  Short-Term Goal Priority:  High Start Date:  11/17/2020                           Expected End Date:  01/09/21                     Follow Up Date 11/28/20   - call for medicine refill 2 or 3 days before it runs out - call if I am sick and can't take my medicine - keep a list of all the medicines I take; vitamins and herbals too - use a pillbox to sort medicine  -work with [facility] for medication assistance for Trelegy -discuss Losartan medication with primary care provider   Why is this important?   . These steps will help you keep on track with your medicines.   Notes:     . [facility]) Track and Manage My Symptoms-COPD   On track    Timeframe:  Long-Range Goal Priority:  Medium Start Date:   11/17/20                          Expected End Date:  05/11/21                     Follow Up Date 11/28/20    - begin a symptom diary - bring symptom diary to all visits - develop a rescue plan - eliminate symptom triggers at home - follow rescue plan if symptoms flare-up - keep follow-up appointments  -continue to  monitor blood pressures daily -take blood pressure log to PCP appointment for provider review   Why is this important?    Tracking your symptoms and other information about your health helps your doctor plan your care.   Write down the symptoms, the time of day, what you were doing and what medicine you are taking.   You will soon learn how to manage your symptoms.     Notes:   1/10 - Reminded to monitor and record weight, blood pressure, and oxygen levels daily      Valente David, Therapist, sports, MSN [place] [phone]

## 2020-12-05 NOTE — Progress Notes (Signed)
[name]   [phone]  OFFICE PROGRESS NOTE  Patient, No Pcp Per No address on file  DIAGNOSIS: Stage IIIA (T2a, N2, M0) non-small cell lung cancer, squamous cell carcinoma presented with large right upper lobe lung mass with right hilar involvement diagnosed in November 2016.  PRIOR THERAPY:  1) Neoadjuvant systemic chemotherapy with carboplatin for AUC of 6 and paclitaxel 200 MG/M2 every 3 weeks, status post 3 cycles with partial response. 2) Video bronchoscopy, right video-assisted thoracoscopy, minithoracotomy, right upper lobectomy with lymph node dissection on 01/18/2016. The final pathology revealed residual squamous cell carcinoma measuring 3.1 cm with close resection margin and metastatic carcinoma to lymph nodes at levels 10R, 12R and 4R. (ypT2a, yN2).  3) Concurrent chemoradiation with weekly carboplatin for AUC of 2 and paclitaxel 45 MG/M2. First dose 02/27/2016. Status post 5 cycles. Last dose was given 03/26/2016. 4) SBRT to left lower lobe lung nodule under the care of Dr. Sondra Come completed on 11/06/2016.  CURRENT THERAPY: Observation.  INTERVAL HISTORY: Tammy Ball 80 y.o. female returns to the clinic today for follow-up visit.  The patient is feeling fine today with no concerning complaints except for the baseline shortness of breath and she is currently on home oxygen.  She was admitted to the hospital twice in December with pneumonia.  She is recovering well but she is currently on home oxygen.  She is trying to wean herself off the oxygen in the next few weeks.  She denied having any current chest pain but has mild cough with no hemoptysis.  She denied having any weight loss or night sweats.  She has no nausea, vomiting, diarrhea or constipation.  She has no headache or visual changes.  She had repeat CT scan of the chest performed recently and she is here for evaluation and discussion of her risk her results.  MEDICAL  HISTORY: Past Medical History:  Diagnosis Date  . Anemia    was while doing chemo  . Anxiety   . Asthma   . Chemotherapy-induced neuropathy ([place]) 11/01/2015  . Claustrophobia   . COPD (chronic obstructive pulmonary disease) ([place])   . Depression   . Encounter for antineoplastic chemotherapy 02/09/2016  . Glaucoma   . Headache    prior to menopause  . Heart murmur   . History of echocardiogram    Echo 2/17: EF 71-24%, grade 1 diastolic dysfunction, mild MR, trivial pericardial effusion  . History of nuclear stress test    Myoview 2/17: no ischemia or scar, EF 79%; low risk  . History of radiation therapy 10/30/16-11/06/16  . Hyperlipidemia   . Hypertension   . Insomnia 05/16/2016  . Non-small cell carcinoma of right lung, stage 2 ([place]) 10/03/2015  . Radiation 02/29/16-04/10/16   50.4 Gy to right central chest  . Renal cell carcinoma ([place])    L nephrectomy  in 2012    ALLERGIES:  is allergic to bee venom, amlodipine, levofloxacin, and hctz [hydrochlorothiazide].  MEDICATIONS:  Current Outpatient Medications  Medication Sig Dispense Refill  . acetaminophen (TYLENOL) 500 MG tablet Take 500 mg by mouth every 6 (six) hours as needed for mild pain, moderate pain, fever or headache. Reported on 05/24/2016    . albuterol (PROVENTIL) (2.5 MG/3ML) 0.083% nebulizer solution Take 3 mLs (2.5 mg total) by nebulization every 6 (six) hours as needed for wheezing or shortness of breath. 75 mL 12  . albuterol (VENTOLIN HFA) 108 (90 Base) MCG/ACT inhaler Inhale 1-2  puffs into the lungs every 6 (six) hours as needed for wheezing or shortness of breath. 1 Inhaler 6  . Ascorbic Acid (VITAMIN C) 1000 MG tablet Take 1,000 mg by mouth daily.    Marland Kitchen aspirin EC 81 MG tablet Take 81 mg by mouth daily.    Marland Kitchen atorvastatin (LIPITOR) 40 MG tablet TAKE 1 TABLET BY MOUTH EVERY DAY (Patient taking differently: Take 40 mg by mouth daily.) 90 tablet 1  . calcium carbonate (TUMS EX) 750 MG chewable tablet Chew 1 tablet by  mouth daily as needed for heartburn.    . Carboxymethylcellulose Sodium (ARTIFICIAL TEARS OP) Place 1 drop into both eyes daily as needed (dry eyes).    . carvedilol (COREG) 12.5 MG tablet Take 12.5 mg by mouth 2 (two) times daily.    . Cholecalciferol (VITAMIN D3 PO) Take 1,200 mg by mouth daily.    . fish oil-omega-3 fatty acids 1000 MG capsule Take 1 capsule by mouth daily.    . fluticasone (FLONASE) 50 MCG/ACT nasal spray Place 2 sprays into both nostrils daily.    . Guaifenesin (MUCINEX MAXIMUM STRENGTH) 1200 MG TB12 Take 1,200 mg by mouth 2 (two) times daily.    Marland Kitchen losartan (COZAAR) 100 MG tablet Take 1 tablet (100 mg total) by mouth daily. 30 tablet 0  . ondansetron (ZOFRAN) 4 MG tablet Take 1 tablet (4 mg total) by mouth every 6 (six) hours. (Patient taking differently: Take 4 mg by mouth every 8 (eight) hours as needed for nausea or vomiting.) 12 tablet 0  . OXYGEN Inhale 2 L into the lungs continuous.    . sodium chloride (OCEAN) 0.65 % SOLN nasal spray Place 1 spray into both nostrils daily.    . traMADol (ULTRAM) 50 MG tablet Take 1 tablet (50 mg total) by mouth every 6 (six) hours as needed (BACK PAIN). 90 tablet 0  . TRELEGY ELLIPTA 100-62.5-25 MCG/INH AEPB INHALE 1 PUFF BY MOUTH EVERY DAY (Patient taking differently: Inhale 1 puff into the lungs daily.) 60 each 11   No current facility-administered medications for this visit.    SURGICAL HISTORY:  Past Surgical History:  Procedure Laterality Date  . BACK SURGERY     cervical 1991  . EYE SURGERY    . IR GENERIC HISTORICAL  01/16/2017   IR RADIOLOGIST EVAL & MGMT 01/16/2017 [facility]  . IR GENERIC HISTORICAL  01/31/2017   IR FLUORO GUIDED NEEDLE PLC ASPIRATION/INJECTION LOC 01/31/2017 Luanne Bras, MD [facility]  . IR GENERIC HISTORICAL  01/31/2017   IR VERTEBROPLASTY CERV/THOR BX INC UNI/BIL INC/INJECT/IMAGING 01/31/2017 Luanne Bras, MD [facility]  . IR GENERIC HISTORICAL  01/31/2017   IR VERTEBROPLASTY EA ADDL  (T&LS) BX INC UNI/BIL INC INJECT/IMAGING 01/31/2017 Luanne Bras, MD [facility]  . kidney cancer    . NEPHRECTOMY    . SPINE SURGERY    . TUBAL LIGATION    . VIDEO ASSISTED THORACOSCOPY (VATS)/WEDGE RESECTION Right 01/18/2016   Procedure: VIDEO ASSISTED THORACOSCOPY (VATS)/LUNG RESECTION, THOROCOTOMY, RIGHT UPPER LOBECTOMY, LYMPH NODE DISSECTION, PLACEMENT OF ON Q;  Surgeon: [name], MD;  Location: [facility];  Service: Thoracic;  Laterality: Right;  Marland Kitchen VIDEO BRONCHOSCOPY Bilateral 09/20/2015   Procedure: VIDEO BRONCHOSCOPY WITHOUT FLUORO;  Surgeon: Rigoberto Noel, MD;  Location: [facility];  Service: Cardiopulmonary;  Laterality: Bilateral;  . VIDEO BRONCHOSCOPY N/A 01/18/2016   Procedure: VIDEO BRONCHOSCOPY;  Surgeon: [name], MD;  Location: [facility];  Service: Thoracic;  Laterality: N/A;  REVIEW OF SYSTEMS:  A comprehensive review of systems was negative except for: Constitutional: positive for fatigue Respiratory: positive for cough and dyspnea on exertion   PHYSICAL EXAMINATION: General appearance: alert, cooperative and no distress Head: Normocephalic, without obvious abnormality, atraumatic Neck: no adenopathy, no JVD, supple, symmetrical, trachea midline and thyroid not enlarged, symmetric, no tenderness/mass/nodules Lymph nodes: Cervical, supraclavicular, and axillary nodes normal. Resp: clear to auscultation bilaterally Back: symmetric, no curvature. ROM normal. No CVA tenderness. Cardio: regular rate and rhythm, S1, S2 normal, no murmur, click, rub or gallop GI: soft, non-tender; bowel sounds normal; no masses,  no organomegaly Extremities: extremities normal, atraumatic, no cyanosis or edema  ECOG PERFORMANCE STATUS: 1 - Symptomatic but completely ambulatory  Blood pressure 134/82, pulse 70, temperature 99 F (37.2 C), resp. rate 19, height 5' 3.5" (1.613 m), weight 185 lb 4.8 oz (84.1 kg), SpO2 100 %.  LABORATORY DATA: Lab Results  Component Value Date    WBC 6.7 12/02/2020   HGB 12.0 12/02/2020   HCT 36.5 12/02/2020   MCV 92.6 12/02/2020   PLT 192 12/02/2020      Chemistry      Component Value Date/Time   NA 137 12/02/2020 0915   NA 140 11/21/2018 1058   NA 140 11/11/2017 0939   K 5.3 (H) 12/02/2020 0915   K 4.6 11/11/2017 0939   CL 100 12/02/2020 0915   CO2 30 12/02/2020 0915   CO2 28 11/11/2017 0939   BUN 21 12/02/2020 0915   BUN 23 11/21/2018 1058   BUN 30.3 (H) 11/11/2017 0939   CREATININE 1.13 (H) 12/02/2020 0915   CREATININE 1.3 (H) 11/11/2017 0939   GLU 105 12/10/2009 0000      Component Value Date/Time   CALCIUM 9.7 12/02/2020 0915   CALCIUM 10.0 11/11/2017 0939   ALKPHOS 81 12/02/2020 0915   ALKPHOS 102 11/11/2017 0939   AST 21 12/02/2020 0915   AST 18 11/11/2017 0939   ALT 22 12/02/2020 0915   ALT 19 11/11/2017 0939   BILITOT 0.7 12/02/2020 0915   BILITOT 0.42 11/11/2017 0939       RADIOGRAPHIC STUDIES: CT ABDOMEN PELVIS WO CONTRAST  Result Date: 11/05/2020 CLINICAL DATA:  Weakness, diarrhea, abdominal pain rated at 2/10, question abdominal abscess/infection; past history of non small cell cancer of the RIGHT lung, renal cell carcinoma post LEFT nephrectomy, COPD, hypertension EXAM: CT ABDOMEN AND PELVIS WITHOUT CONTRAST TECHNIQUE: Multidetector CT imaging of the abdomen and pelvis was performed following the standard protocol without IV contrast. Neither oral nor intravenous contrast were administered. Sagittal and coronal MPR images reconstructed from axial data set. COMPARISON:  02/06/2011 FINDINGS: Lower chest: Subsegmental atelectasis in LEFT lower lobe with rounded opacity at the base of the LEFT lower lobe which could represent atelectasis, consolidation, or tumor measuring 3.0 x 2.3 cm image 22. Hepatobiliary: Gallbladder and liver normal appearance Pancreas: Normal appearance Spleen: Normal appearance Adrenals/Urinary Tract: Surgical absence of LEFT kidney. LEFT renal bed unremarkable. Adrenal glands,  RIGHT kidney, RIGHT ureter, and bladder normal appearance Stomach/Bowel: Normal appendix. Stomach and bowel loops unremarkable Vascular/Lymphatic: Atherosclerotic calcifications aorta and iliac arteries without aneurysm. No adenopathy. Reproductive: Atrophic uterus with unremarkable adnexa Other: No free air or free fluid. Tiny RIGHT inguinal hernia containing fat. Musculoskeletal: Osseous demineralization. Degenerative changes LEFT hip joint. Facet degenerative changes lower lumbar spine with grade 1 anterolisthesis L4-L5. IMPRESSION: Surgical absence of LEFT kidney. Tiny RIGHT inguinal hernia containing fat. No acute intra-abdominal or intrapelvic abnormalities. Subsegmental atelectasis in LEFT lower lobe  with rounded opacity at the base of the LEFT lower lobe which could represent atelectasis, consolidation, or tumor measuring 3.0 x 2.3 cm. When compared to an earlier CT study of 05/12/2018, this opacity appears slightly larger and more rounded in both the axial and sagittal planes; further evaluation by PET-CT recommended to exclude neoplasm. Aortic Atherosclerosis (ICD10-I70.0). Electronically Signed   By: Lavonia Dana M.D.   On: 11/05/2020 14:03   CT Chest Wo Contrast  Result Date: 12/02/2020 CLINICAL DATA:  Primary Cancer Type: Lung Imaging Indication: Routine surveillance Interval therapy since last imaging? No Initial Cancer Diagnosis Date: 09/20/2015; Established by: Biopsy-proven Detailed Pathology: Stage IIIA non-small cell lung cancer, squamous cell carcinoma. Primary Tumor location: Right upper lobe. Solitary left lower lobe lung nodule (oligometastasis). Surgeries: Right upper lobectomy 01/18/2016. Vertebroplasty T3 and T4. Chemotherapy: Yes; Ongoing? No; Most recent administration: 03/26/2016 Immunotherapy? No Radiation therapy? Yes Date Range: 10/30/2016 - 11/06/2016; Target: Left lower lung Date Range: 02/29/2020 - 04/10/2016; Target: Right central chest Other Cancers: Left renal cell carcinoma;  nephrectomy 2012. EXAM: CT CHEST WITHOUT CONTRAST TECHNIQUE: Multidetector CT imaging of the chest was performed following the standard protocol without IV contrast. COMPARISON:  Most recent CT chest 11/10/2020.  10/03/2016 PET-CT. FINDINGS: Cardiovascular: Calcified atheromatous plaque in the thoracic aorta. No aneurysmal dilation. Stable appearance of the aorta and heart. No pericardial effusion. Coronary artery calcification of LEFT coronary circulation. Engorgement of central pulmonary vasculature suggestive of pulmonary arterial hypertension is similar to previous imaging. Limited assessment of cardiovascular structures given lack of intravenous contrast. Mediastinum/Nodes: Esophagus grossly normal. No axillary lymphadenopathy. No thoracic inlet or mediastinal lymphadenopathy. Distortion of RIGHT hilum with similar appearance to the prior exam and to the exam of July of 2021 Lungs/Pleura: Resolution of the nodular consolidative changes in the LEFT upper lobe. Still with minimal ground-glass but resolution of the septal thickening that was present previously. Ground-glass with geographic features. Consolidative changes in LEFT lower lobe are no longer associated with pleural effusion with a focal area of persistent nodular consolidation measuring 3.1 x 2.1 cm (image 100, series 7) these are stable compared to the study of May 30, 2020. Background pulmonary emphysema as before with evidence of previous RIGHT upper lobectomy and post treatment changes about the RIGHT mediastinal border. No new or suspicious pulmonary nodule. Small subpleural nodule in the LEFT upper chest (image 22, series 7) 3 mm unchanged since July of 2021. Subpleural reticulation along the peripheral RIGHT chest is unchanged as well. Upper Abdomen: Imaged portions of liver, gallbladder, spleen, pancreas and adrenal glands without acute process. Mildly lobular appearance of the distal pancreas is unchanged dating back to 2017 showing no  increased FDG uptake on prior PET imaging perhaps an intrapancreatic splenule or variant pancreatic tissue. Post LEFT nephrectomy partially imaged. Smooth contour of the RIGHT kidney, visualized portions. Musculoskeletal: No acute musculoskeletal process. Lower cervical spinal fusion and signs of cement augmentation of T3 and T4 vertebral bodies and associated compression fractures with similar appearance. Compression fracture at T6 is unchanged. IMPRESSION: 1. Resolution of presumed pneumonia in the LEFT upper lobe. 2. LEFT lower lobe area of nodular presumed scarring/post treatment change. Area showing bandlike appearance on coronal images that is minimally changed in coronal plane dating back to July of 2020 and is unchanged dating back to July of 2021, attention on follow-up. 3. Background pulmonary emphysema with evidence of previous RIGHT upper lobectomy and post treatment changes about the RIGHT mediastinal border. 4. Engorgement of central pulmonary vasculature suggestive of pulmonary  arterial hypertension is similar to previous imaging. 5. Emphysema and aortic atherosclerosis. Aortic Atherosclerosis (ICD10-I70.0) and Emphysema (ICD10-J43.9). Electronically Signed   By: Zetta Bills M.D.   On: 12/02/2020 11:02   CT Chest W Contrast  Result Date: 11/10/2020 CLINICAL DATA:  Non-small cell lung cancer. Cough with hemoptysis and shortness of breath. Reported recent negative COVID testing. EXAM: CT CHEST WITH CONTRAST TECHNIQUE: Multidetector CT imaging of the chest was performed during intravenous contrast administration. CONTRAST:  103mL OMNIPAQUE IOHEXOL 300 MG/ML  SOLN COMPARISON:  Abdominal CT and chest radiographs 11/05/2020. Chest CT 05/30/2020 and 12/01/2019 FINDINGS: Cardiovascular: Atherosclerosis of the aorta, great vessels and coronary arteries. There is central enlargement of the pulmonary arteries consistent with pulmonary arterial hypertension. The heart size is stable. No significant  pericardial fluid. Mediastinum/Nodes: There are no enlarged mediastinal, hilar or axillary lymph nodes. The thyroid gland, trachea and esophagus demonstrate no significant findings. Lungs/Pleura: Moderate centrilobular and paraseptal emphysema with stable postsurgical changes from previous right upper lobe resection. There are stable paramediastinal radiation changes on the right. Compared with the recent abdominal CT, there are new trace and small left pleural effusions. There is associated increased compressive atelectasis and opacity within the left lower lobe obscuring the previously demonstrated nodular density. In addition, there are patchy airspace opacities with air bronchograms throughout the lingula, likely reflecting pneumonia. These were seen on recent chest radiographs. There is a somewhat nodular component measuring up to 2.8 x 2.2 cm on image 62/7, containing an air bronchogram. No suspicious right lung nodules. Upper abdomen: The visualized upper abdomen appears stable without significant findings post left nephrectomy. Musculoskeletal/Chest wall: There is no chest wall mass or suspicious osseous finding. Previous C6-7 ACDF. Stable compression deformities at T3, T4 and T6 with post spinal augmentation changes at T3 and T4. IMPRESSION: 1. New patchy airspace opacities with air bronchograms throughout the lingula and left lower lobe, likely reflecting pneumonia. Radiographic follow-up necessary to document resolution. 2. Compared with recent abdominal CT, new left-greater-than-right pleural effusions with associated partial left lower lobe collapse, obscuring a previously demonstrated nodular left lower lobe density. 3. Stable paramediastinal radiation changes on the right. No evidence of local recurrence or metastatic disease. 4. Central enlargement of the pulmonary arteries consistent with pulmonary arterial hypertension. 5. Aortic Atherosclerosis (ICD10-I70.0) and Emphysema (ICD10-J43.9).  Electronically Signed   By: Richardean Sale M.D.   On: 11/10/2020 14:54   DG Chest Port 1 View  Result Date: 11/05/2020 CLINICAL DATA:  Sepsis EXAM: PORTABLE CHEST 1 VIEW COMPARISON:  01/07/2019, 05/30/2020 FINDINGS: Heart size is normal. Atherosclerotic calcification of the aortic knob. Airspace consolidation within the left mid to lower lung fields. No large pleural fluid collection. No pneumothorax. Degenerative changes of the bilateral shoulders. IMPRESSION: Airspace consolidation within the left mid to lower lung fields, concerning for pneumonia. Radiographic follow-up to resolution is recommended. Electronically Signed   By: Davina Poke D.O.   On: 11/05/2020 11:38    ASSESSMENT AND PLAN:  This is a very pleasant 80 years old white female with a stage IIIa non-small cell lung cancer, squamous cell carcinoma status post neoadjuvant systemic chemotherapy was carboplatin and paclitaxel with partial response followed by right upper lobectomy and lymph node dissection. She was found to have evidence of metastatic disease to the mediastinal lymph nodes and close resection margin. The patient underwent a course of concurrent chemoradiation with weekly carboplatin and paclitaxel. She was also treated with SBRT left lower lobe pulmonary nodule under the care of Dr. Sondra Come. The  patient has been on observation for close to 5 years.  The patient has no concerning complaints today except for the shortness of breath and cough after her recent diagnosis with pneumonia. She had repeat CT scan of the chest performed recently.  I personally and independently reviewed the scans and discussed the results with the patient today. Her scan showed resolution of the pneumonia and there is no evidence for metastatic disease in the chest. I recommended for her to continue on observation with repeat CT scan of the chest in 6 months. The patient was advised to call immediately if she has any concerning symptoms in the  interval.  The patient voices understanding of current disease status and treatment options and is in agreement with the current care plan. All questions were answered. The patient knows to call the clinic with any problems, questions or concerns. We can certainly see the patient much sooner if necessary.  Disclaimer: This note was dictated with voice recognition software. Similar sounding words can inadvertently be transcribed and may not be corrected upon review.

## 2020-12-07 ENCOUNTER — Other Ambulatory Visit: Payer: Self-pay | Admitting: Emergency Medicine

## 2020-12-07 DIAGNOSIS — I130 Hypertensive heart and chronic kidney disease with heart failure and stage 1 through stage 4 chronic kidney disease, or unspecified chronic kidney disease: Secondary | ICD-10-CM | POA: Diagnosis not present

## 2020-12-07 DIAGNOSIS — J439 Emphysema, unspecified: Secondary | ICD-10-CM | POA: Diagnosis not present

## 2020-12-07 DIAGNOSIS — J181 Lobar pneumonia, unspecified organism: Secondary | ICD-10-CM | POA: Diagnosis not present

## 2020-12-07 DIAGNOSIS — I272 Pulmonary hypertension, unspecified: Secondary | ICD-10-CM | POA: Diagnosis not present

## 2020-12-07 DIAGNOSIS — J9819 Other pulmonary collapse: Secondary | ICD-10-CM | POA: Diagnosis not present

## 2020-12-07 DIAGNOSIS — J9610 Chronic respiratory failure, unspecified whether with hypoxia or hypercapnia: Secondary | ICD-10-CM | POA: Diagnosis not present

## 2020-12-07 MED ORDER — ALBUTEROL SULFATE (2.5 MG/3ML) 0.083% IN NEBU: 2.5000 mg | INHALATION_SOLUTION | Freq: Four times a day (QID) | RESPIRATORY_TRACT | 6 refills | Status: DC | PRN

## 2020-12-07 NOTE — Telephone Encounter (Signed)
Requested medication (s) are due for refill today - yes  Requested medication (s) are on the active medication list -yes  Future visit scheduled -yes  Last refill: 1 year  Notes to clinic: Request RF of medication prescribed by outside provider- sent for review of request  Requested Prescriptions  Pending Prescriptions Disp Refills   albuterol (PROVENTIL) (2.5 MG/3ML) 0.083% nebulizer solution 75 mL 12    Sig: Take 3 mLs (2.5 mg total) by nebulization every 6 (six) hours as needed for wheezing or shortness of breath.      Pulmonology:  Beta Agonists Failed - 12/07/2020  2:42 PM      Failed - One inhaler should last at least one month. If the patient is requesting refills earlier, contact the patient to check for uncontrolled symptoms.      Passed - Valid encounter within last 12 months    Recent Outpatient Visits           2 weeks ago Hospital discharge follow-up   Primary Care at [facility], Ines Bloomer, MD   9 months ago Essential hypertension   Primary Care at [facility], Arlie Solomons, MD   1 year ago Uncontrolled hypertension   Primary Care at [facility] [place], MD   1 year ago Essential hypertension   [facility] at [facility], Delfino Lovett, NP   1 year ago Acute left-sided low back pain with left-sided sciatica   Primary Care at [facility], Arlie Solomons, MD       Future Appointments             In 2 months [name], Ines Bloomer, MD Primary Care at [place], [facility]                 Requested Prescriptions  Pending Prescriptions Disp Refills   albuterol (PROVENTIL) (2.5 MG/3ML) 0.083% nebulizer solution 75 mL 12    Sig: Take 3 mLs (2.5 mg total) by nebulization every 6 (six) hours as needed for wheezing or shortness of breath.      Pulmonology:  Beta Agonists Failed - 12/07/2020  2:42 PM      Failed - One inhaler should last at least one month. If the patient is requesting refills earlier, contact the patient to check for uncontrolled symptoms.       Passed - Valid encounter within last 12 months    Recent Outpatient Visits           2 weeks ago Hospital discharge follow-up   Primary Care at [facility], Ines Bloomer, MD   9 months ago Essential hypertension   Primary Care at [facility], Arlie Solomons, MD   1 year ago Uncontrolled hypertension   Primary Care at [facility], Arlie Solomons, MD   1 year ago Essential hypertension   [facility] at [facility] [place], NP   1 year ago Acute left-sided low back pain with left-sided sciatica   Primary Care at [facility], Arlie Solomons, MD       Future Appointments             In 2 months [name], Ines Bloomer, MD Primary Care at [place], [facility]

## 2020-12-07 NOTE — Telephone Encounter (Signed)
Medication Refill - Medication: albuterol (PROVENTIL) (2.5 MG/3ML) 0.083% nebulizer solution  Pt is completely out of her solution, please advise   Has the patient contacted their pharmacy? No. (Agent: If no, request that the patient contact the pharmacy for the refill.) (Agent: If yes, when and what did the pharmacy advise?)  Preferred Pharmacy (with phone number or street name):  [id number] [address], [place]  [place] [address]  Phone: [phone] Fax: [phone]     Agent: Please be advised that RX refills may take up to 3 business days. We ask that you follow-up with your pharmacy.

## 2020-12-09 ENCOUNTER — Other Ambulatory Visit: Payer: Self-pay

## 2020-12-09 DIAGNOSIS — J181 Lobar pneumonia, unspecified organism: Secondary | ICD-10-CM | POA: Diagnosis not present

## 2020-12-09 DIAGNOSIS — I272 Pulmonary hypertension, unspecified: Secondary | ICD-10-CM | POA: Diagnosis not present

## 2020-12-09 DIAGNOSIS — J439 Emphysema, unspecified: Secondary | ICD-10-CM | POA: Diagnosis not present

## 2020-12-09 DIAGNOSIS — J9610 Chronic respiratory failure, unspecified whether with hypoxia or hypercapnia: Secondary | ICD-10-CM | POA: Diagnosis not present

## 2020-12-09 DIAGNOSIS — I130 Hypertensive heart and chronic kidney disease with heart failure and stage 1 through stage 4 chronic kidney disease, or unspecified chronic kidney disease: Secondary | ICD-10-CM | POA: Diagnosis not present

## 2020-12-09 DIAGNOSIS — J9819 Other pulmonary collapse: Secondary | ICD-10-CM | POA: Diagnosis not present

## 2020-12-09 MED ORDER — TRELEGY ELLIPTA 100-62.5-25 MCG/INH IN AEPB: INHALATION_SPRAY | RESPIRATORY_TRACT | 11 refills | Status: DC

## 2020-12-12 ENCOUNTER — Other Ambulatory Visit: Payer: Self-pay | Admitting: *Deleted

## 2020-12-12 DIAGNOSIS — J439 Emphysema, unspecified: Secondary | ICD-10-CM | POA: Diagnosis not present

## 2020-12-12 DIAGNOSIS — J9610 Chronic respiratory failure, unspecified whether with hypoxia or hypercapnia: Secondary | ICD-10-CM | POA: Diagnosis not present

## 2020-12-12 DIAGNOSIS — J9819 Other pulmonary collapse: Secondary | ICD-10-CM | POA: Diagnosis not present

## 2020-12-12 DIAGNOSIS — I272 Pulmonary hypertension, unspecified: Secondary | ICD-10-CM | POA: Diagnosis not present

## 2020-12-12 DIAGNOSIS — J181 Lobar pneumonia, unspecified organism: Secondary | ICD-10-CM | POA: Diagnosis not present

## 2020-12-12 DIAGNOSIS — I130 Hypertensive heart and chronic kidney disease with heart failure and stage 1 through stage 4 chronic kidney disease, or unspecified chronic kidney disease: Secondary | ICD-10-CM | POA: Diagnosis not present

## 2020-12-12 NOTE — Patient Outreach (Signed)
[facility]) Ball Management  12/12/2020  Tammy Ball [birth year] [id number]   Transition of Ball cal placed to member, state she continues to improve.  Blood pressure controlled, however she remains concerned about the potential damage to her kidneys.  State her PCP (Dr. Nolon Rod) is out for a few weeks, someone else covering her cases.  She is going to reach out to her cardiologist to discuss concern.  Encouraged to continue taking meds as instructed, recording blood pressures daily.  Still waiting on call from pulmonology to schedule follow up, otherwise doing well.    Verified with Ball management assistant, member is not eligible for services due to current PCP.  Will close case at this time, member educated to contact this Ball manager with concerns or questions.  Goals Addressed            This Visit's Progress   . COMPLETED: [facility]) Learn More About My Health       Timeframe:  Short-Term Goal Priority:  Medium Start Date: 11/17/2020                            Expected End Date: 12/17/2020                       Follow Up Date 11/28/2020    - tell my story and reason for my visit - make a list of questions - ask questions - repeat what I heard to make sure I understand - bring a list of my medicines to the visit - speak up when I don't understand    Why is this important?    The best way to learn about your health and Ball is by talking to the doctor and nurse.   They will answer your questions and give you information in the way that you like best.    Notes:   1/31 - case closed    . COMPLETED: [facility]) Make and Keep All Appointments       Timeframe:  Long-Range Goal Priority:  High Start Date:   11/17/2020                          Expected End Date:   02/09/21                    Follow Up Date 11/28/20    - ask family or friend for a ride - call to cancel if needed - keep a calendar with appointment dates  -review list of transportation options  mailed out to you -schedule Pulmonary follow up appointment within the next 2 weeks   Why is this important?    Part of staying healthy is seeing the doctor for follow-up Ball.   If you forget your appointments, there are some things you can do to stay on track.    Notes:   1/10 - Referral to CSW for transportation resources  1/31 - case closed    . COMPLETED: [facility]) Manage My Medicine       Timeframe:  Short-Term Goal Priority:  High Start Date:  11/17/2020                           Expected End Date:  01/09/21  Follow Up Date 11/28/20   - call for medicine refill 2 or 3 days before it runs out - call if I am sick and can't take my medicine - keep a list of all the medicines I take; vitamins and herbals too - use a pillbox to sort medicine  -work with [facility] for medication assistance for Trelegy -discuss Losartan medication with primary Ball provider   Why is this important?   . These steps will help you keep on track with your medicines.   Notes:   1/31 - case closed    . COMPLETED: [facility]) Track and Manage My Symptoms-COPD       Timeframe:  Long-Range Goal Priority:  Medium Start Date:   11/17/20                          Expected End Date:  05/11/21                     Follow Up Date 11/28/20    - begin a symptom diary - bring symptom diary to all visits - develop a rescue plan - eliminate symptom triggers at home - follow rescue plan if symptoms flare-up - keep follow-up appointments  -continue to monitor blood pressures daily -take blood pressure log to PCP appointment for provider review   Why is this important?    Tracking your symptoms and other information about your health helps your doctor plan your Ball.   Write down the symptoms, the time of day, what you were doing and what medicine you are taking.   You will soon learn how to manage your symptoms.     Notes:   1/10 - Reminded to monitor and record weight, blood pressure, and  oxygen levels daily  1/31 - Case closed        Tammy David, RN, MSN [employer] Manager [phone]

## 2020-12-14 ENCOUNTER — Other Ambulatory Visit: Payer: Self-pay | Admitting: Emergency Medicine

## 2020-12-14 DIAGNOSIS — I130 Hypertensive heart and chronic kidney disease with heart failure and stage 1 through stage 4 chronic kidney disease, or unspecified chronic kidney disease: Secondary | ICD-10-CM | POA: Diagnosis not present

## 2020-12-14 DIAGNOSIS — I272 Pulmonary hypertension, unspecified: Secondary | ICD-10-CM | POA: Diagnosis not present

## 2020-12-14 DIAGNOSIS — J9819 Other pulmonary collapse: Secondary | ICD-10-CM | POA: Diagnosis not present

## 2020-12-14 DIAGNOSIS — J9610 Chronic respiratory failure, unspecified whether with hypoxia or hypercapnia: Secondary | ICD-10-CM | POA: Diagnosis not present

## 2020-12-14 DIAGNOSIS — J181 Lobar pneumonia, unspecified organism: Secondary | ICD-10-CM | POA: Diagnosis not present

## 2020-12-14 DIAGNOSIS — J439 Emphysema, unspecified: Secondary | ICD-10-CM | POA: Diagnosis not present

## 2020-12-16 DIAGNOSIS — J9819 Other pulmonary collapse: Secondary | ICD-10-CM | POA: Diagnosis not present

## 2020-12-16 DIAGNOSIS — G620 Drug-induced polyneuropathy: Secondary | ICD-10-CM | POA: Diagnosis not present

## 2020-12-16 DIAGNOSIS — D509 Iron deficiency anemia, unspecified: Secondary | ICD-10-CM | POA: Diagnosis not present

## 2020-12-16 DIAGNOSIS — Z7951 Long term (current) use of inhaled steroids: Secondary | ICD-10-CM | POA: Diagnosis not present

## 2020-12-16 DIAGNOSIS — I130 Hypertensive heart and chronic kidney disease with heart failure and stage 1 through stage 4 chronic kidney disease, or unspecified chronic kidney disease: Secondary | ICD-10-CM | POA: Diagnosis not present

## 2020-12-16 DIAGNOSIS — Z9181 History of falling: Secondary | ICD-10-CM | POA: Diagnosis not present

## 2020-12-16 DIAGNOSIS — J181 Lobar pneumonia, unspecified organism: Secondary | ICD-10-CM | POA: Diagnosis not present

## 2020-12-16 DIAGNOSIS — F32A Depression, unspecified: Secondary | ICD-10-CM | POA: Diagnosis not present

## 2020-12-16 DIAGNOSIS — M170 Bilateral primary osteoarthritis of knee: Secondary | ICD-10-CM | POA: Diagnosis not present

## 2020-12-16 DIAGNOSIS — T451X5S Adverse effect of antineoplastic and immunosuppressive drugs, sequela: Secondary | ICD-10-CM | POA: Diagnosis not present

## 2020-12-16 DIAGNOSIS — Z7982 Long term (current) use of aspirin: Secondary | ICD-10-CM | POA: Diagnosis not present

## 2020-12-16 DIAGNOSIS — H409 Unspecified glaucoma: Secondary | ICD-10-CM | POA: Diagnosis not present

## 2020-12-16 DIAGNOSIS — N1831 Chronic kidney disease, stage 3a: Secondary | ICD-10-CM | POA: Diagnosis not present

## 2020-12-16 DIAGNOSIS — Z902 Acquired absence of lung [part of]: Secondary | ICD-10-CM | POA: Diagnosis not present

## 2020-12-16 DIAGNOSIS — R011 Cardiac murmur, unspecified: Secondary | ICD-10-CM | POA: Diagnosis not present

## 2020-12-16 DIAGNOSIS — I5032 Chronic diastolic (congestive) heart failure: Secondary | ICD-10-CM | POA: Diagnosis not present

## 2020-12-16 DIAGNOSIS — Z9981 Dependence on supplemental oxygen: Secondary | ICD-10-CM | POA: Diagnosis not present

## 2020-12-16 DIAGNOSIS — J9610 Chronic respiratory failure, unspecified whether with hypoxia or hypercapnia: Secondary | ICD-10-CM | POA: Diagnosis not present

## 2020-12-16 DIAGNOSIS — F40240 Claustrophobia: Secondary | ICD-10-CM | POA: Diagnosis not present

## 2020-12-16 DIAGNOSIS — G4700 Insomnia, unspecified: Secondary | ICD-10-CM | POA: Diagnosis not present

## 2020-12-16 DIAGNOSIS — E785 Hyperlipidemia, unspecified: Secondary | ICD-10-CM | POA: Diagnosis not present

## 2020-12-16 DIAGNOSIS — J439 Emphysema, unspecified: Secondary | ICD-10-CM | POA: Diagnosis not present

## 2020-12-16 DIAGNOSIS — Z85118 Personal history of other malignant neoplasm of bronchus and lung: Secondary | ICD-10-CM | POA: Diagnosis not present

## 2020-12-16 DIAGNOSIS — I272 Pulmonary hypertension, unspecified: Secondary | ICD-10-CM | POA: Diagnosis not present

## 2020-12-16 DIAGNOSIS — F419 Anxiety disorder, unspecified: Secondary | ICD-10-CM | POA: Diagnosis not present

## 2020-12-19 DIAGNOSIS — I272 Pulmonary hypertension, unspecified: Secondary | ICD-10-CM | POA: Diagnosis not present

## 2020-12-19 DIAGNOSIS — J439 Emphysema, unspecified: Secondary | ICD-10-CM | POA: Diagnosis not present

## 2020-12-19 DIAGNOSIS — J9610 Chronic respiratory failure, unspecified whether with hypoxia or hypercapnia: Secondary | ICD-10-CM | POA: Diagnosis not present

## 2020-12-19 DIAGNOSIS — J9819 Other pulmonary collapse: Secondary | ICD-10-CM | POA: Diagnosis not present

## 2020-12-19 DIAGNOSIS — I130 Hypertensive heart and chronic kidney disease with heart failure and stage 1 through stage 4 chronic kidney disease, or unspecified chronic kidney disease: Secondary | ICD-10-CM | POA: Diagnosis not present

## 2020-12-19 DIAGNOSIS — J181 Lobar pneumonia, unspecified organism: Secondary | ICD-10-CM | POA: Diagnosis not present

## 2020-12-26 DIAGNOSIS — I130 Hypertensive heart and chronic kidney disease with heart failure and stage 1 through stage 4 chronic kidney disease, or unspecified chronic kidney disease: Secondary | ICD-10-CM | POA: Diagnosis not present

## 2020-12-26 DIAGNOSIS — J181 Lobar pneumonia, unspecified organism: Secondary | ICD-10-CM | POA: Diagnosis not present

## 2020-12-26 DIAGNOSIS — J439 Emphysema, unspecified: Secondary | ICD-10-CM | POA: Diagnosis not present

## 2020-12-26 DIAGNOSIS — J9610 Chronic respiratory failure, unspecified whether with hypoxia or hypercapnia: Secondary | ICD-10-CM | POA: Diagnosis not present

## 2020-12-26 DIAGNOSIS — I272 Pulmonary hypertension, unspecified: Secondary | ICD-10-CM | POA: Diagnosis not present

## 2020-12-26 DIAGNOSIS — J9819 Other pulmonary collapse: Secondary | ICD-10-CM | POA: Diagnosis not present

## 2021-01-02 DIAGNOSIS — J181 Lobar pneumonia, unspecified organism: Secondary | ICD-10-CM | POA: Diagnosis not present

## 2021-01-02 DIAGNOSIS — I272 Pulmonary hypertension, unspecified: Secondary | ICD-10-CM | POA: Diagnosis not present

## 2021-01-02 DIAGNOSIS — I130 Hypertensive heart and chronic kidney disease with heart failure and stage 1 through stage 4 chronic kidney disease, or unspecified chronic kidney disease: Secondary | ICD-10-CM | POA: Diagnosis not present

## 2021-01-02 DIAGNOSIS — J9819 Other pulmonary collapse: Secondary | ICD-10-CM | POA: Diagnosis not present

## 2021-01-02 DIAGNOSIS — J439 Emphysema, unspecified: Secondary | ICD-10-CM | POA: Diagnosis not present

## 2021-01-02 DIAGNOSIS — J9610 Chronic respiratory failure, unspecified whether with hypoxia or hypercapnia: Secondary | ICD-10-CM | POA: Diagnosis not present

## 2021-01-04 ENCOUNTER — Other Ambulatory Visit: Payer: Self-pay

## 2021-01-04 ENCOUNTER — Ambulatory Visit (INDEPENDENT_AMBULATORY_CARE_PROVIDER_SITE_OTHER): Payer: Medicare Other | Admitting: Emergency Medicine

## 2021-01-04 ENCOUNTER — Encounter: Payer: Self-pay | Admitting: Emergency Medicine

## 2021-01-04 DIAGNOSIS — J9611 Chronic respiratory failure with hypoxia: Secondary | ICD-10-CM | POA: Diagnosis not present

## 2021-01-04 DIAGNOSIS — J449 Chronic obstructive pulmonary disease, unspecified: Secondary | ICD-10-CM | POA: Diagnosis not present

## 2021-01-04 DIAGNOSIS — C3491 Malignant neoplasm of unspecified part of right bronchus or lung: Secondary | ICD-10-CM | POA: Diagnosis not present

## 2021-01-04 NOTE — Assessment & Plan Note (Signed)
Agree with repeat CT scan of the chest in July 2022.  Follow-up with Dr. Julien Nordmann to review.

## 2021-01-04 NOTE — Assessment & Plan Note (Addendum)
Improved from acute exacerbation 1 month ago.  She was treated for a subtle community-acquired pneumonia.  Cough is resolved.  She still has exertional dyspnea but improving.  We talked today about whether she could change from Trelegy to an alternative that does not have ICS.  This might be reasonable since she does not flare frequently, is less than once a year.  For now she will stay on the Trelegy, ensure that she is stable, then assess her formulary to see if an alternative might make sense.  We can consider Stiolto, Anoro, Bevespi, etc.  We will continue Trelegy 1 inhalation once daily.  Rinse and gargle after you use it. Bring your insurance formulary for inhalers with you to your next visit.  We will assess to see if an alternative to Trelegy would make sense. Keep your albuterol available to use 2 puffs if needed shortness of breath, chest tightness, wheezing. Vaccines are up-to-date Follow with Dr Lamonte Sakai in 6 months or sooner if you have any problems

## 2021-01-04 NOTE — Progress Notes (Signed)
Subjective:    Patient ID: Tammy Ball, female    DOB: [birth year], 80 y.o.   MRN: [id number]   HPI 80 year old woman with a history of tobacco use (pack years) renal cell carcinoma (2012), squamous cell lung cancer diagnosed by bronchoscopy November 2016 status post RU lobectomy and chemoradiation, hypertension with diastolic dysfunction, COPD with mild obstruction noted on PFT 12/27/2015 that I reviewed. She had normal volumes and a decreased DLCO. She has O2 at home, has had it for the last year.   Most recent CT chest 11/25/18, reviewed by me shows no evidence recurrence, chronic LLL scar, no adenopathy.   She was admitted with an AE-COPD +/- CAP in mid February 2020. Started as URI sx (known sick contact). She has 6 more days of steroids. She is having exertional SOB with chores. She has minimal wheeze, worst at night when she lays down. Her maintenance is Advair, uses albuterol typically only when flaring.   ROV 04/01/20 --follow-up visit 80 year old woman with a history of tobacco use and COPD, renal cell carcinoma, squamous cell lung cancer with a right upper lobe lobectomy November 2016 followed by chemoradiation.  Also with hypertension and diastolic dysfunction.  We have been managing her on Trelegy.  She reports that she is doing well on this - no side effects. Superior to Advair. No flares in over a year when she had a viral infxn (? COVID). She uses albuterol very rarely, once a month. Has used some flonase prn, zyrtec prn. She does [place] prn as well.  Following her Ct chest every 6 months. Last Ct was 12/01/19, reviewed by me, 3.3x2.3 LLL opacity. Next in 05/2020.   ROV 01/04/21 --Tammy Ball is 80, follows up today for her history of COPD, right upper lobe lobectomy and chemoradiation for squamous cell lung cancer (09/2015).  Then underwent SBRT to the left lower lobe nodule 11/06/2016.  Also with a history of renal cell carcinoma, hypertension with diastolic dysfunction. We have followed a left  lower lobe opacity with serial CTs, last was 12/02/2020, showed stable left lower lobe nodular bandlike scarring that is stable dating back to July 2020.  She is on continued observation with a plan for repeat CT this summer. She was seen in late December, then admitted w possible CAP and AE-COPD. Had not had COVID, is vaccinated. Had not not had an AE for 2 yrs until this one.  She is on Trelegy, tolerating. Uses albuterol very rarely.  She has SOB with almost any exertion like housework, walking for 500 ft, sometimes also speaking. She wears O2 at 2L/min, uses pulsed when out of the home. Her cough is much better.   MDM: Reviewed hospital notes from 12/30-1/3 Reviewed oncology notes from 12/05/2020 Reviewed CT chest from 12/02/2020   Review of Systems  Constitutional: Negative for fever and unexpected weight change.  HENT: Negative for congestion, dental problem, ear pain, nosebleeds, postnasal drip, rhinorrhea, sinus pressure, sneezing, sore throat and trouble swallowing.   Eyes: Negative for redness and itching.  Respiratory: Positive for shortness of breath and wheezing. Negative for cough and chest tightness.   Cardiovascular: Negative for palpitations and leg swelling.  Gastrointestinal: Negative for nausea and vomiting.  Genitourinary: Negative for dysuria.  Musculoskeletal: Negative for joint swelling.  Skin: Negative for rash.  Allergic/Immunologic: Negative.  Negative for environmental allergies, food allergies and immunocompromised state.  Neurological: Negative for headaches.  Hematological: Does not bruise/bleed easily.  Psychiatric/Behavioral: Negative for dysphoric mood. The patient is nervous/anxious.  Objective:   Physical Exam Vitals:   01/04/21 1139  BP: 124/82  Pulse: 80  Temp: (!) 97.4 F (36.3 C)  TempSrc: Temporal  SpO2: 98%  Weight: 187 lb (84.8 kg)  Height: 5' 3.5" (1.613 m)   Gen: Pleasant, elderly woman, in no distress,  normal affect  ENT: No  lesions,  mouth clear,  oropharynx clear, no postnasal drip  Neck: No JVD, harsh UA noise  Lungs: No use of accessory muscles, no crackles or wheezing on normal respiration, good air movement on forced exp as well  Cardiovascular: RRR, heart sounds normal, no murmur or gallops, no peripheral edema  Musculoskeletal: No deformities, no cyanosis or clubbing  Neuro: alert, awake, non focal  Skin: Warm, no lesions or rash      Assessment & Plan:  COPD GOLD B-C Improved from acute exacerbation 1 month ago.  She was treated for a subtle community-acquired pneumonia.  Cough is resolved.  She still has exertional dyspnea but improving.  We talked today about whether she could change from Trelegy to an alternative that does not have ICS.  This might be reasonable since she does not flare frequently, is less than once a year.  For now she will stay on the Trelegy, ensure that she is stable, then assess her formulary to see if an alternative might make sense.  We can consider Stiolto, Anoro, Bevespi, etc.  We will continue Trelegy 1 inhalation once daily.  Rinse and gargle after you use it. Bring your insurance formulary for inhalers with you to your next visit.  We will assess to see if an alternative to Trelegy would make sense. Keep your albuterol available to use 2 puffs if needed shortness of breath, chest tightness, wheezing. Vaccines are up-to-date Follow with Dr Lamonte Sakai in 6 months or sooner if you have any problems  Chronic respiratory failure with hypoxia ([place]) Continue your oxygen at 2 L/min.  Non-small cell carcinoma of right lung, stage 2 ([place]) Agree with repeat CT scan of the chest in July 2022.  Follow-up with Dr. Julien Nordmann to review.   Baltazar Apo, MD, PhD 01/04/2021, 12:19 PM [facility] [phone] or if no answer [phone]

## 2021-01-04 NOTE — Patient Instructions (Signed)
We will continue Trelegy 1 inhalation once daily.  Rinse and gargle after you use it. Bring your insurance formulary for inhalers with you to your next visit.  We will assess to see if an alternative to Trelegy would make sense. Keep your albuterol available to use 2 puffs if needed shortness of breath, chest tightness, wheezing. Continue your oxygen at 2 L/min. Agree with repeat CT scan of the chest in July 2022.  Follow-up with Dr. Julien Nordmann to review. Vaccines are up-to-date Follow with Dr Lamonte Sakai in 6 months or sooner if you have any problems

## 2021-01-04 NOTE — Assessment & Plan Note (Signed)
Continue your oxygen at 2 L/min.

## 2021-02-05 ENCOUNTER — Other Ambulatory Visit: Payer: Self-pay | Admitting: Emergency Medicine

## 2021-02-06 ENCOUNTER — Other Ambulatory Visit: Payer: Self-pay | Admitting: *Deleted

## 2021-02-06 ENCOUNTER — Telehealth: Payer: Self-pay | Admitting: *Deleted

## 2021-02-06 MED ORDER — ATORVASTATIN CALCIUM 40 MG PO TABS: 40.0000 mg | ORAL_TABLET | Freq: Every day | ORAL | 3 refills | Status: DC

## 2021-02-06 NOTE — Telephone Encounter (Signed)
Routing to CMA 

## 2021-02-06 NOTE — Telephone Encounter (Signed)
Spoke to patient to confirm if she needed Losartan, but patient stated it was Atorvastatin. Medication was refilled, sent to [facility].

## 2021-02-20 ENCOUNTER — Other Ambulatory Visit: Payer: Self-pay

## 2021-02-21 ENCOUNTER — Other Ambulatory Visit: Payer: Self-pay

## 2021-02-21 ENCOUNTER — Encounter: Payer: Self-pay | Admitting: Emergency Medicine

## 2021-02-21 ENCOUNTER — Ambulatory Visit (INDEPENDENT_AMBULATORY_CARE_PROVIDER_SITE_OTHER): Payer: Medicare Other | Admitting: Emergency Medicine

## 2021-02-21 VITALS — BP 140/90 | HR 73 | Temp 99.0°F | Ht 63.5 in | Wt 185.8 lb

## 2021-02-21 DIAGNOSIS — J449 Chronic obstructive pulmonary disease, unspecified: Secondary | ICD-10-CM

## 2021-02-21 DIAGNOSIS — C3491 Malignant neoplasm of unspecified part of right bronchus or lung: Secondary | ICD-10-CM

## 2021-02-21 DIAGNOSIS — I700 Atherosclerosis of aorta: Secondary | ICD-10-CM

## 2021-02-21 DIAGNOSIS — F3342 Major depressive disorder, recurrent, in full remission: Secondary | ICD-10-CM | POA: Diagnosis not present

## 2021-02-21 DIAGNOSIS — N1831 Chronic kidney disease, stage 3a: Secondary | ICD-10-CM | POA: Diagnosis not present

## 2021-02-21 DIAGNOSIS — G620 Drug-induced polyneuropathy: Secondary | ICD-10-CM | POA: Diagnosis not present

## 2021-02-21 DIAGNOSIS — I5032 Chronic diastolic (congestive) heart failure: Secondary | ICD-10-CM

## 2021-02-21 DIAGNOSIS — T451X5A Adverse effect of antineoplastic and immunosuppressive drugs, initial encounter: Secondary | ICD-10-CM

## 2021-02-21 DIAGNOSIS — I10 Essential (primary) hypertension: Secondary | ICD-10-CM

## 2021-02-21 DIAGNOSIS — C778 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: Secondary | ICD-10-CM | POA: Insufficient documentation

## 2021-02-21 NOTE — Progress Notes (Signed)
Tammy Ball 80 y.o.   Chief Complaint  Patient presents with  . Follow-up    3 month medical conditions   ASSESSMENT AND PLAN:  This is a very pleasant 80 years old white female with a stage IIIa non-small cell lung cancer, squamous cell carcinoma status post neoadjuvant systemic chemotherapy was carboplatin and paclitaxel with partial response followed by right upper lobectomy and lymph node dissection. She was found to have evidence of metastatic disease to the mediastinal lymph nodes and close resection margin. The patient underwent a course of concurrent chemoradiation with weekly carboplatin and paclitaxel. She was also treated with SBRT left lower lobe pulmonary nodule under the care of Dr. Sondra Come. The patient has been on observation for close to 5 years.  The patient has no concerning complaints today except for the shortness of breath and cough after her recent diagnosis with pneumonia. She had repeat CT scan of the chest performed recently.  I personally and independently reviewed the scans and discussed the results with the patient today. Her scan showed resolution of the pneumonia and there is no evidence for metastatic disease in the chest. I recommended for her to continue on observation with repeat CT scan of the chest in 6 months. The patient was advised to call immediately if she has any concerning symptoms in the interval.  Improved from acute exacerbation 1 month ago.  She was treated for a subtle community-acquired pneumonia.  Cough is resolved.  She still has exertional dyspnea but improving.  We talked today about whether she could change from Trelegy to an alternative that does not have ICS.  This might be reasonable since she does not flare frequently, is less than once a year.  For now she will stay on the Trelegy, ensure that she is stable, then assess her formulary to see if an alternative might make sense.  We can consider Stiolto, Anoro, Bevespi, etc.  We will  continue Trelegy 1 inhalation once daily.  Rinse and gargle after you use it. Bring your insurance formulary for inhalers with you to your next visit.  We will assess to see if an alternative to Trelegy would make sense. Keep your albuterol available to use 2 puffs if needed shortness of breath, chest tightness, wheezing. Vaccines are up-to-date Follow with Dr Lamonte Sakai in 6 months or sooner if you have any problems HISTORY OF PRESENT ILLNESS: This is a 80 y.o. female with multiple chronic medical conditions here for follow-up. #1 COPD on daily Trelegy.  Last pulmonary office visit reviewed.  On oxygen as needed #2 history of nonsmall cell lung cancer.  Last oncology office visit reviewed. #3 history hypertension: Doing well.  Takes losartan 100 mg daily. #4 chronic diastolic CHF #5 aortic atherosclerosis and dyslipidemia #6 CKD 3A Doing well.  Has no complaints or medical concerns today.  HPI   Prior to Admission medications   Medication Sig Start Date End Date Taking? Authorizing Provider  acetaminophen (TYLENOL) 500 MG tablet Take 500 mg by mouth every 6 (six) hours as needed for mild pain, moderate pain, fever or headache. Reported on 05/24/2016   Yes [provider]  albuterol (PROVENTIL) (2.5 MG/3ML) 0.083% nebulizer solution Take 3 mLs (2.5 mg total) by nebulization every 6 (six) hours as needed for wheezing or shortness of breath. 12/07/20  Yes Garey Alleva, Ines Bloomer, MD  albuterol (VENTOLIN HFA) 108 (90 Base) MCG/ACT inhaler Inhale 1-2 puffs into the lungs every 6 (six) hours as needed for wheezing or shortness of  breath. 02/13/19  Yes Forrest Moron, MD  Ascorbic Acid (VITAMIN C) 1000 MG tablet Take 1,000 mg by mouth daily.   Yes [provider]  aspirin EC 81 MG tablet Take 81 mg by mouth daily.   Yes [provider]  atorvastatin (LIPITOR) 40 MG tablet Take 1 tablet (40 mg total) by mouth daily. 02/06/21  Yes Bev Drennen, Ines Bloomer, MD  calcium carbonate (TUMS  EX) 750 MG chewable tablet Chew 1 tablet by mouth daily as needed for heartburn.   Yes [provider]  Carboxymethylcellulose Sodium (ARTIFICIAL TEARS OP) Place 1 drop into both eyes daily as needed (dry eyes).   Yes [provider]  carvedilol (COREG) 12.5 MG tablet Take 12.5 mg by mouth 2 (two) times daily. 10/12/20  Yes [provider]  Cholecalciferol (VITAMIN D3 PO) Take 1,200 mg by mouth daily.   Yes [provider]  fish oil-omega-3 fatty acids 1000 MG capsule Take 1 capsule by mouth daily.   Yes [provider]  Fluticasone-Umeclidin-Vilant (TRELEGY ELLIPTA) 100-62.5-25 MCG/INH AEPB INHALE 1 PUFF BY MOUTH EVERY DAY 12/09/20  Yes Byrum, Rose Fillers, MD  Guaifenesin [facility] MAXIMUM STRENGTH) 1200 MG TB12 Take 1,200 mg by mouth 2 (two) times daily.   Yes [provider]  Loratadine (CLARITIN PO) Take by mouth.   Yes [provider]  losartan (COZAAR) 100 MG tablet TAKE 1 TABLET BY MOUTH EVERY DAY 12/14/20  Yes Sherryann Frese, Ines Bloomer, MD  OXYGEN Inhale 2 L into the lungs continuous.   Yes [provider]  sodium chloride (OCEAN) 0.65 % SOLN nasal spray Place 1 spray into both nostrils daily.   Yes [provider]  traMADol (ULTRAM) 50 MG tablet Take 1 tablet (50 mg total) by mouth every 6 (six) hours as needed (BACK PAIN). 06/01/19  Yes Stallings, Zoe A, MD  fluticasone (FLONASE) 50 MCG/ACT nasal spray Place 2 sprays into both nostrils daily. Patient not taking: Reported on 02/21/2021    [provider]  ondansetron (ZOFRAN) 4 MG tablet Take 1 tablet (4 mg total) by mouth every 6 (six) hours. Patient taking differently: Take 4 mg by mouth every 8 (eight) hours as needed for nausea or vomiting. 11/05/20   Lennice Sites, DO    Allergies  Allergen Reactions  . Bee Venom Anaphylaxis, Shortness Of Breath, Swelling and Other (See Comments)    Swelling at site   . Amlodipine Swelling and Other (See Comments)     Swelling of the ankles and hands   . Levofloxacin Other (See Comments)    Joint pain   . Hctz [Hydrochlorothiazide] Palpitations and Other (See Comments)    Sweating     Patient Active Problem List   Diagnosis Date Noted  . 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 Recurrent major depressive disorder, in full remission ([place]) 02/21/2021  . Atherosclerosis of aorta ([place]) 02/21/2021  . 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 Chronic diastolic CHF (congestive heart failure) ([place])   . CKD (chronic kidney disease) stage 3, GFR 30-59 ml/min (HCC) 11/10/2020  . Allergic rhinitis 01/06/2019  . Chronic respiratory failure with hypoxia ([place]) 12/26/2018  . Age-related osteoporosis with current pathological fracture 11/21/2018  . Renal insufficiency 05/20/2018  . History of compression fracture of spine 12/10/2016  . Hypercalcemia 06/25/2016  . Insomnia 05/16/2016  . Pneumonitis, radiation ([place]) 04/11/2016  . S/P lobectomy of lung 01/18/2016  . Chemotherapy-induced neuropathy ([place]) 11/01/2015  .  Non-small cell carcinoma of right lung, stage 2 ([place]) 10/03/2015  . Hyperlipidemia 08/14/2013  . Spondylolisthesis of lumbar region 07/03/2013  . Essential hypertension 12/17/2011  . COPD GOLD B-C 12/17/2011  . Renal cell cancer ([place]) 12/17/2011  . Sciatica of left side 12/17/2011  . Depression 12/17/2011    Past Medical History:  Diagnosis Date  . Anemia    was while doing chemo  . Anxiety   . Asthma   . Chemotherapy-induced neuropathy ([place]) 11/01/2015  . Claustrophobia   . COPD (chronic obstructive pulmonary disease) ([place])   . Depression   . Encounter for antineoplastic chemotherapy 02/09/2016  . Glaucoma   . Headache    prior to menopause  . Heart murmur   . History of echocardiogram    Echo 2/17: EF 33-29%, grade 1 diastolic dysfunction, mild MR, trivial pericardial effusion  . History of nuclear stress test     Myoview 2/17: no ischemia or scar, EF 79%; low risk  . History of radiation therapy 10/30/16-11/06/16  . Hyperlipidemia   . Hypertension   . Insomnia 05/16/2016  . Non-small cell carcinoma of right lung, stage 2 ([place]) 10/03/2015  . Radiation 02/29/16-04/10/16   50.4 Gy to right central chest  . Renal cell carcinoma ([place])    L nephrectomy  in 2012    Past Surgical History:  Procedure Laterality Date  . BACK SURGERY     cervical 1991  . EYE SURGERY    . IR GENERIC HISTORICAL  01/16/2017   IR RADIOLOGIST EVAL & MGMT 01/16/2017 [facility]  . IR GENERIC HISTORICAL  01/31/2017   IR FLUORO GUIDED NEEDLE PLC ASPIRATION/INJECTION LOC 01/31/2017 Luanne Bras, MD [facility]  . IR GENERIC HISTORICAL  01/31/2017   IR VERTEBROPLASTY CERV/THOR BX INC UNI/BIL INC/INJECT/IMAGING 01/31/2017 Luanne Bras, MD [facility]  . IR GENERIC HISTORICAL  01/31/2017   IR VERTEBROPLASTY EA ADDL (T&LS) BX INC UNI/BIL INC INJECT/IMAGING 01/31/2017 Luanne Bras, MD [facility]  . kidney cancer    . NEPHRECTOMY    . SPINE SURGERY    . TUBAL LIGATION    . VIDEO ASSISTED THORACOSCOPY (VATS)/WEDGE RESECTION Right 01/18/2016   Procedure: VIDEO ASSISTED THORACOSCOPY (VATS)/LUNG RESECTION, THOROCOTOMY, RIGHT UPPER LOBECTOMY, LYMPH NODE DISSECTION, PLACEMENT OF ON Q;  Surgeon: [name], MD;  Location: [facility];  Service: Thoracic;  Laterality: Right;  Marland Kitchen VIDEO BRONCHOSCOPY Bilateral 09/20/2015   Procedure: VIDEO BRONCHOSCOPY WITHOUT FLUORO;  Surgeon: Rigoberto Noel, MD;  Location: [facility];  Service: Cardiopulmonary;  Laterality: Bilateral;  . VIDEO BRONCHOSCOPY N/A 01/18/2016   Procedure: VIDEO BRONCHOSCOPY;  Surgeon: [name], MD;  Location: [facility] OR;  Service: Thoracic;  Laterality: N/A;    Social History   Socioeconomic History  . Marital status: Widowed    Spouse name: Not on file  . Number of children: 4  . Years of education: Not on file  . Highest education level: High school graduate   Occupational History  . Not on file  Tobacco Use  . Smoking status: Former Smoker    Packs/day: 1.00    Years: 50.00    Pack years: 50.00    Types: Cigarettes    Quit date: 03/16/2011    Years since quitting: 9.9  . Smokeless tobacco: Never Used  . Tobacco comment: last 4-5 years of smoking, smoked 0.5 pack/day   Vaping Use  . Vaping Use: Never used  Substance and Sexual Activity  . Alcohol use: No    Alcohol/week: 0.0 standard drinks  .  Drug use: No  . Sexual activity: Not Currently  Other Topics Concern  . Not on file  Social History Narrative   Marital status: divorced; not dating in 2019.      Children: 4 children; 3 grandchildren adult; 4 gg.      Lives: alone in house      Employment: part-time work; substance abuse counselor; [employer].      Tobacco: quit smoking 2012; smoked 45 years      Alcohol: none      Drugs: none      Exercise: none in 2019; due to LEFT sciatica.      ADLs: independent with ADLs; drives.       Advanced Directives: YES: HCPOA: Nicholas Martinez/son.  FULL CODE but no prolonged measures.      Occupation: Substance Abuse Estate agent   No exercise** Merged History Encounter **       ** Data from: 12/14/11 Enc Dept: [facility]       ** Data from: 12/17/11 Enc Dept: [facility]   Substance abuse counselor   Husband deceased   4 great grandchildren   Son works in same substance abuse counseling center as patient   Social Determinants of Radio broadcast assistant Strain: Low Risk   . Difficulty of Paying Living Expenses: Not hard at all  Food Insecurity: No Food Insecurity  . Worried About Charity fundraiser in the Last Year: Never true  . Ran Out of Food in the Last Year: Never true  Transportation Needs: No Transportation Needs  . Lack of Transportation (Medical): No  . Lack of Transportation (Non-Medical): No  Physical Activity: Inactive  . Days of Exercise per Week: 0 days  .  Minutes of Exercise per Session: 0 min  Stress: Not on file  Social Connections: Not on file  Intimate Partner Violence: Not on file    Family History  Problem Relation Age of Onset  . Heart disease Sister   . Obesity Brother   . Heart attack Daughter 74       s/p CABG  . Glaucoma Daughter   . Breast cancer Sister   . Birth defects Sister   . Cancer Mother        Bladder Cancer  . Hypertension Mother   . CAD Mother 24  . Cancer Maternal Grandmother      Review of Systems  Constitutional: Negative.  Negative for chills and fever.  HENT: Negative.  Negative for congestion and sore throat.   Respiratory: Positive for shortness of breath (Dyspnea on exertion).   Cardiovascular: Negative.  Negative for chest pain and palpitations.  Gastrointestinal: Negative for abdominal pain, diarrhea, nausea and vomiting.  Genitourinary: Negative.  Negative for dysuria and hematuria.  Skin: Negative.  Negative for rash.  Neurological: Negative.  Negative for dizziness and headaches.  All other systems reviewed and are negative.   Today's Vitals   02/21/21 1007  BP: 140/90  Pulse: 73  Temp: 99 F (37.2 C)  TempSrc: Oral  SpO2: 96%  Weight: 185 lb 12.8 oz (84.3 kg)  Height: 5' 3.5" (1.613 m)   Body mass index is 32.4 kg/m. Wt Readings from Last 3 Encounters:  02/21/21 185 lb 12.8 oz (84.3 kg)  01/04/21 187 lb (84.8 kg)  12/05/20 185 lb 4.8 oz (84.1 kg)    Physical Exam Vitals reviewed.  Constitutional:      Appearance: Normal appearance.  HENT:     Head: Normocephalic.  Eyes:     Extraocular Movements: Extraocular movements intact.     Pupils: Pupils are equal, round, and reactive to light.  Cardiovascular:     Rate and Rhythm: Normal rate and regular rhythm.     Pulses: Normal pulses.     Heart sounds: Normal heart sounds.  Pulmonary:     Effort: Pulmonary effort is normal.     Breath sounds: Normal breath sounds.  Musculoskeletal:        General: Normal range of  motion.     Cervical back: Normal range of motion and neck supple.  Skin:    General: Skin is warm and dry.     Capillary Refill: Capillary refill takes less than 2 seconds.  Neurological:     General: No focal deficit present.     Mental Status: She is alert and oriented to person, place, and time.  Psychiatric:        Mood and Affect: Mood normal.        Behavior: Behavior normal.    A total of 30 minutes was spent with the patient, greater than 50% of which was in counseling/coordination of care regarding multiple chronic medical problems and their management, review of all medications, review of most recent pulmonary and oncology office visits, review of most recent office visit with me, review of most recent blood work results, education on nutrition, prognosis, documentation, need for follow-up.   ASSESSMENT & PLAN: Clinically stable.  No medical concerns identified during this visit. Continue present medications.  No changes. Follow-up in 6 months.  Meghna was seen today for follow-up.  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 Falls are a major cause of broken bones and head injuries in people who are older than age 62. Getting regular preventive care can help to keep you healthy and well. Preventive care includes getting regular testing and making  lifestyle changes as recommended by your health care provider. Talk with your health care provider about:  Which screenings and tests you should have. A screening is a test that checks for a disease when you have no symptoms.  A diet and exercise plan that is right for you. What should I know about screenings and tests to prevent falls? Screening and testing are the best ways to find a health problem early. Early diagnosis and treatment give you the best chance of managing medical conditions that are common after age 74. Certain conditions and lifestyle choices may make you more likely to have a fall. Your health care provider may recommend:  Regular vision checks. Poor vision and conditions such as cataracts can make you more likely to have a fall. If you wear glasses, make sure to get your prescription updated if your vision changes.  Medicine review. Work with your health care provider to regularly review all of the medicines you are taking,  including over-the-counter medicines. Ask your health care provider about any side effects that may make you more likely to have a fall. Tell your health care provider if any medicines that you take make you feel dizzy or sleepy.  Osteoporosis screening. Osteoporosis is a condition that causes the bones to get weaker. This can make the bones weak and cause them to break more easily.  Blood pressure screening. Blood pressure changes and medicines to control blood pressure can make you feel dizzy.  Strength and balance checks. Your health care provider may recommend certain tests to check your strength and balance while standing, walking, or changing positions.  Foot health exam. Foot pain and numbness, as well as not wearing proper footwear, can make you more likely to have a fall.  Depression screening. You may be more likely to have a fall if you have a fear of falling, feel emotionally low, or feel unable to do activities that you used to do.  Alcohol  use screening. Using too much alcohol can affect your balance and may make you more likely to have a fall. What actions can I take to lower my risk of falls? General instructions  Talk with your health care provider about your risks for falling. Tell your health care provider if: ? You fall. Be sure to tell your health care provider about all falls, even ones that seem minor. ? You feel dizzy, sleepy, or off-balance.  Take over-the-counter and prescription medicines only as told by your health care provider. These include any supplements.  Eat a healthy diet and maintain a healthy weight. A healthy diet includes low-fat dairy products, low-fat (lean) meats, and fiber from whole grains, beans, and lots of fruits and vegetables. Home safety  Remove any tripping hazards, such as rugs, cords, and clutter.  Install safety equipment such as grab bars in bathrooms and safety rails on stairs.  Keep rooms and walkways well-lit. Activity  Follow a regular exercise program to stay fit. This will help you maintain your balance. Ask your health care provider what types of exercise are appropriate for you.  If you need a cane or walker, use it as recommended by your health care provider.  Wear supportive shoes that have nonskid soles.   Lifestyle  Do not drink alcohol if your health care provider tells you not to drink.  If you drink alcohol, limit how much you have: ? 0-1 drink a day for women. ? 0-2 drinks a day for men.  Be aware of how much alcohol is in your drink. In the [place] one drink equals one typical bottle of beer (12 oz), one-half glass of wine (5 oz), or one shot of hard liquor (1 oz).  Do not use any products that contain nicotine or tobacco, such as cigarettes and e-cigarettes. If you need help quitting, ask your health care provider. Summary  Having a healthy lifestyle and getting preventive care can help to protect your health and wellness after age 56.  Screening and  testing are the best way to find a health problem early and help you avoid having a fall. Early diagnosis and treatment give you the best chance for managing medical conditions that are more common for people who are older than age 36.  Falls are a major cause of broken bones and head injuries in people who are older than age 55. Take precautions to prevent a fall at home.  Work with your health care provider to learn what changes you can  make to improve your health and wellness and to prevent falls. This information is not intended to replace advice given to you by your health care provider. Make sure you discuss any questions you have with your health care provider. Document Revised: 02/19/2019 Document Reviewed: 09/11/2017 Elsevier Patient Education  2021 [place], MD [facility]

## 2021-02-21 NOTE — Patient Instructions (Signed)
Health Maintenance After Age 78 After age 78, you are at a higher risk for certain long-term diseases and infections as well as injuries from falls. Falls are a major cause of broken bones and head injuries in people who are older than age 78. Getting regular preventive care can help to keep you healthy and well. Preventive care includes getting regular testing and making lifestyle changes as recommended by your health care provider. Talk with your health care provider about:  Which screenings and tests you should have. A screening is a test that checks for a disease when you have no symptoms.  A diet and exercise plan that is right for you. What should I know about screenings and tests to prevent falls? Screening and testing are the best ways to find a health problem early. Early diagnosis and treatment give you the best chance of managing medical conditions that are common after age 78. Certain conditions and lifestyle choices may make you more likely to have a fall. Your health care provider may recommend:  Regular vision checks. Poor vision and conditions such as cataracts can make you more likely to have a fall. If you wear glasses, make sure to get your prescription updated if your vision changes.  Medicine review. Work with your health care provider to regularly review all of the medicines you are taking, including over-the-counter medicines. Ask your health care provider about any side effects that may make you more likely to have a fall. Tell your health care provider if any medicines that you take make you feel dizzy or sleepy.  Osteoporosis screening. Osteoporosis is a condition that causes the bones to get weaker. This can make the bones weak and cause them to break more easily.  Blood pressure screening. Blood pressure changes and medicines to control blood pressure can make you feel dizzy.  Strength and balance checks. Your health care provider may recommend certain tests to check your  strength and balance while standing, walking, or changing positions.  Foot health exam. Foot pain and numbness, as well as not wearing proper footwear, can make you more likely to have a fall.  Depression screening. You may be more likely to have a fall if you have a fear of falling, feel emotionally low, or feel unable to do activities that you used to do.  Alcohol use screening. Using too much alcohol can affect your balance and may make you more likely to have a fall. What actions can I take to lower my risk of falls? General instructions  Talk with your health care provider about your risks for falling. Tell your health care provider if: ? You fall. Be sure to tell your health care provider about all falls, even ones that seem minor. ? You feel dizzy, sleepy, or off-balance.  Take over-the-counter and prescription medicines only as told by your health care provider. These include any supplements.  Eat a healthy diet and maintain a healthy weight. A healthy diet includes low-fat dairy products, low-fat (lean) meats, and fiber from whole grains, beans, and lots of fruits and vegetables. Home safety  Remove any tripping hazards, such as rugs, cords, and clutter.  Install safety equipment such as grab bars in bathrooms and safety rails on stairs.  Keep rooms and walkways well-lit. Activity  Follow a regular exercise program to stay fit. This will help you maintain your balance. Ask your health care provider what types of exercise are appropriate for you.  If you need a cane or walker,   use it as recommended by your health care provider.  Wear supportive shoes that have nonskid soles.   Lifestyle  Do not drink alcohol if your health care provider tells you not to drink.  If you drink alcohol, limit how much you have: ? 0-1 drink a day for women. ? 0-2 drinks a day for men.  Be aware of how much alcohol is in your drink. In the [place] one drink equals one typical bottle of beer (12  oz), one-half glass of wine (5 oz), or one shot of hard liquor (1 oz).  Do not use any products that contain nicotine or tobacco, such as cigarettes and e-cigarettes. If you need help quitting, ask your health care provider. Summary  Having a healthy lifestyle and getting preventive care can help to protect your health and wellness after age 78.  Screening and testing are the best way to find a health problem early and help you avoid having a fall. Early diagnosis and treatment give you the best chance for managing medical conditions that are more common for people who are older than age 78.  Falls are a major cause of broken bones and head injuries in people who are older than age 78. Take precautions to prevent a fall at home.  Work with your health care provider to learn what changes you can make to improve your health and wellness and to prevent falls. This information is not intended to replace advice given to you by your health care provider. Make sure you discuss any questions you have with your health care provider. Document Revised: 02/19/2019 Document Reviewed: 09/11/2017 Elsevier Patient Education  2021 [employer]  

## 2021-03-23 DIAGNOSIS — Z23 Encounter for immunization: Secondary | ICD-10-CM | POA: Diagnosis not present

## 2021-04-13 ENCOUNTER — Other Ambulatory Visit: Payer: Self-pay | Admitting: Emergency Medicine

## 2021-04-13 NOTE — Telephone Encounter (Signed)
Spoke to pt in regards to her Trelegy, due to her insurance pt needs written RX.  Triage please see pended RX and have Dr. Lamonte Sakai sign and fax to [place].

## 2021-05-18 ENCOUNTER — Other Ambulatory Visit: Payer: Self-pay

## 2021-05-18 ENCOUNTER — Ambulatory Visit (INDEPENDENT_AMBULATORY_CARE_PROVIDER_SITE_OTHER): Payer: Medicare Other

## 2021-05-18 VITALS — BP 160/80 | HR 75 | Temp 97.5°F | Ht 64.0 in | Wt 185.8 lb

## 2021-05-18 DIAGNOSIS — I10 Essential (primary) hypertension: Secondary | ICD-10-CM | POA: Diagnosis not present

## 2021-05-18 DIAGNOSIS — Z0000 Encounter for general adult medical examination without abnormal findings: Secondary | ICD-10-CM | POA: Diagnosis not present

## 2021-05-18 DIAGNOSIS — J449 Chronic obstructive pulmonary disease, unspecified: Secondary | ICD-10-CM

## 2021-05-18 NOTE — Patient Instructions (Signed)
Ms. Tammy Ball , Thank you for taking time to come for your Medicare Wellness Visit. I appreciate your ongoing commitment to your health goals. Please review the following plan we discussed and let me know if I can assist you in the future.   Screening recommendations/referrals: Colonoscopy: last done 05/09/2012; due every 10 years Mammogram: last done 03/21/2018; due every year Bone Density: last done 05/17/2015; due every 2 years Recommended yearly ophthalmology/optometry visit for glaucoma screening and checkup Recommended yearly dental visit for hygiene and checkup  Vaccinations: Influenza vaccine: 07/28/2020 Pneumococcal vaccine: 08/23/2017 Tdap vaccine: 12/02/2013; due every 10 years Shingles vaccine: never done   Covid-19: 12/24/2019, 01/18/2020, 08/07/2020, 03/23/2021  Advanced directives: Please bring a copy of your health care power of attorney and living will to the office at your convenience.  Conditions/risks identified: My goal is to lose weight to help improve my breathing  and back pain.  Next appointment: Please schedule your next Medicare Wellness Visit with your Nurse Health Advisor in 1 year by calling [phone].   Preventive Care 78 Years and Older, Female Preventive care refers to lifestyle choices and visits with your health care provider that can promote health and wellness. What does preventive care include? A yearly physical exam. This is also called an annual well check. Dental exams once or twice a year. Routine eye exams. Ask your health care provider how often you should have your eyes checked. Personal lifestyle choices, including: Daily care of your teeth and gums. Regular physical activity. Eating a healthy diet. Avoiding tobacco and drug use. Limiting alcohol use. Practicing safe sex. Taking low-dose aspirin every day. Taking vitamin and mineral supplements as recommended by your health care provider. What happens during an annual well check? The services  and screenings done by your health care provider during your annual well check will depend on your age, overall health, lifestyle risk factors, and family history of disease. Counseling  Your health care provider may ask you questions about your: Alcohol use. Tobacco use. Drug use. Emotional well-being. Home and relationship well-being. Sexual activity. Eating habits. History of falls. Memory and ability to understand (cognition). Work and work Statistician. Reproductive health. Screening  You may have the following tests or measurements: Height, weight, and BMI. Blood pressure. Lipid and cholesterol levels. These may be checked every 5 years, or more frequently if you are over 78 years old. Skin check. Lung cancer screening. You may have this screening every year starting at age 78 if you have a 30-pack-year history of smoking and currently smoke or have quit within the past 15 years. Fecal occult blood test (FOBT) of the stool. You may have this test every year starting at age 78. Flexible sigmoidoscopy or colonoscopy. You may have a sigmoidoscopy every 5 years or a colonoscopy every 10 years starting at age 78. Hepatitis C blood test. Hepatitis B blood test. Sexually transmitted disease (STD) testing. Diabetes screening. This is done by checking your blood sugar (glucose) after you have not eaten for a while (fasting). You may have this done every 1-3 years. Bone density scan. This is done to screen for osteoporosis. You may have this done starting at age 78. Mammogram. This may be done every 1-2 years. Talk to your health care provider about how often you should have regular mammograms. Talk with your health care provider about your test results, treatment options, and if necessary, the need for more tests. Vaccines  Your health care provider may recommend certain vaccines, such as: Influenza vaccine.  This is recommended every year. Tetanus, diphtheria, and acellular pertussis  (Tdap, Td) vaccine. You may need a Td booster every 10 years. Zoster vaccine. You may need this after age 78. Pneumococcal 13-valent conjugate (PCV13) vaccine. One dose is recommended after age 78. Pneumococcal polysaccharide (PPSV23) vaccine. One dose is recommended after age 78. Talk to your health care provider about which screenings and vaccines you need and how often you need them. This information is not intended to replace advice given to you by your health care provider. Make sure you discuss any questions you have with your health care provider. Document Released: 11/25/2015 Document Revised: 07/18/2016 Document Reviewed: 08/30/2015 Elsevier Interactive Patient Education  2017 [place] Prevention in the Home Falls can cause injuries. They can happen to people of all ages. There are many things you can do to make your home safe and to help prevent falls. What can I do on the outside of my home? Regularly fix the edges of walkways and driveways and fix any cracks. Remove anything that might make you trip as you walk through a door, such as a raised step or threshold. Trim any bushes or trees on the path to your home. Use bright outdoor lighting. Clear any walking paths of anything that might make someone trip, such as rocks or tools. Regularly check to see if handrails are loose or broken. Make sure that both sides of any steps have handrails. Any raised decks and porches should have guardrails on the edges. Have any leaves, snow, or ice cleared regularly. Use sand or salt on walking paths during winter. Clean up any spills in your garage right away. This includes oil or grease spills. What can I do in the bathroom? Use night lights. Install grab bars by the toilet and in the tub and shower. Do not use towel bars as grab bars. Use non-skid mats or decals in the tub or shower. If you need to sit down in the shower, use a plastic, non-slip stool. Keep the floor dry. Clean  up any water that spills on the floor as soon as it happens. Remove soap buildup in the tub or shower regularly. Attach bath mats securely with double-sided non-slip rug tape. Do not have throw rugs and other things on the floor that can make you trip. What can I do in the bedroom? Use night lights. Make sure that you have a light by your bed that is easy to reach. Do not use any sheets or blankets that are too big for your bed. They should not hang down onto the floor. Have a firm chair that has side arms. You can use this for support while you get dressed. Do not have throw rugs and other things on the floor that can make you trip. What can I do in the kitchen? Clean up any spills right away. Avoid walking on wet floors. Keep items that you use a lot in easy-to-reach places. If you need to reach something above you, use a strong step stool that has a grab bar. Keep electrical cords out of the way. Do not use floor polish or wax that makes floors slippery. If you must use wax, use non-skid floor wax. Do not have throw rugs and other things on the floor that can make you trip. What can I do with my stairs? Do not leave any items on the stairs. Make sure that there are handrails on both sides of the stairs and use them. Fix handrails  that are broken or loose. Make sure that handrails are as long as the stairways. Check any carpeting to make sure that it is firmly attached to the stairs. Fix any carpet that is loose or worn. Avoid having throw rugs at the top or bottom of the stairs. If you do have throw rugs, attach them to the floor with carpet tape. Make sure that you have a light switch at the top of the stairs and the bottom of the stairs. If you do not have them, ask someone to add them for you. What else can I do to help prevent falls? Wear shoes that: Do not have high heels. Have rubber bottoms. Are comfortable and fit you well. Are closed at the toe. Do not wear sandals. If you  use a stepladder: Make sure that it is fully opened. Do not climb a closed stepladder. Make sure that both sides of the stepladder are locked into place. Ask someone to hold it for you, if possible. Clearly mark and make sure that you can see: Any grab bars or handrails. First and last steps. Where the edge of each step is. Use tools that help you move around (mobility aids) if they are needed. These include: Canes. Walkers. Scooters. Crutches. Turn on the lights when you go into a dark area. Replace any light bulbs as soon as they burn out. Set up your furniture so you have a clear path. Avoid moving your furniture around. If any of your floors are uneven, fix them. If there are any pets around you, be aware of where they are. Review your medicines with your doctor. Some medicines can make you feel dizzy. This can increase your chance of falling. Ask your doctor what other things that you can do to help prevent falls. This information is not intended to replace advice given to you by your health care provider. Make sure you discuss any questions you have with your health care provider. Document Released: 08/25/2009 Document Revised: 04/05/2016 Document Reviewed: 12/03/2014 Elsevier Interactive Patient Education  2017 [employer].

## 2021-05-18 NOTE — Progress Notes (Signed)
Subjective:   Tammy Ball is a 80 y.o. female who presents for Medicare Annual (Subsequent) preventive examination.  Review of Systems     Cardiac Risk Factors include: advanced age (>29men, >3 women);dyslipidemia;hypertension;obesity (BMI >30kg/m2);family history of premature cardiovascular disease     Objective:    Today's Vitals   05/18/21 1222 05/18/21 1239  Pulse:  75  Temp:  (!) 97.5 F (36.4 C)  SpO2:  98%  Weight:  185 lb 12.8 oz (84.3 kg)  Height:  5\' 4"  (1.626 m)  PainSc: 4  4    Body mass index is 31.89 kg/m.  Advanced Directives 05/18/2021 11/17/2020 11/10/2020 11/05/2020 03/29/2020 05/28/2019 01/07/2019  Does Patient Have a Medical Advance Directive? Yes Yes No No Yes No Yes  Type of Advance Directive Living will;Healthcare Power of [place];Living will - - [place];Living will  Does patient want to make changes to medical advance directive? No - Patient declined No - Patient declined - - No - Patient declined No - Patient declined No - Patient declined  Copy of [place] in Chart? No - copy requested No - copy requested - - - - -  Would patient like information on creating a medical advance directive? - - No - Patient declined - - - -    Current Medications (verified) Outpatient Encounter Medications as of 05/18/2021  Medication Sig   acetaminophen (TYLENOL) 500 MG tablet Take 500 mg by mouth every 6 (six) hours as needed for mild pain, moderate pain, fever or headache. Reported on 05/24/2016   albuterol (PROVENTIL) (2.5 MG/3ML) 0.083% nebulizer solution Take 3 mLs (2.5 mg total) by nebulization every 6 (six) hours as needed for wheezing or shortness of breath.   albuterol (VENTOLIN HFA) 108 (90 Base) MCG/ACT inhaler Inhale 1-2 puffs into the lungs every 6 (six) hours as needed for wheezing or shortness of breath.   Ascorbic Acid (VITAMIN C) 1000 MG tablet Take 1,000 mg  by mouth daily.   aspirin EC 81 MG tablet Take 81 mg by mouth daily.   atorvastatin (LIPITOR) 40 MG tablet Take 1 tablet (40 mg total) by mouth daily.   calcium carbonate (TUMS EX) 750 MG chewable tablet Chew 1 tablet by mouth daily as needed for heartburn.   Carboxymethylcellulose Sodium (ARTIFICIAL TEARS OP) Place 1 drop into both eyes daily as needed (dry eyes).   carvedilol (COREG) 12.5 MG tablet Take 12.5 mg by mouth 2 (two) times daily.   Cholecalciferol (VITAMIN D3 PO) Take 1,200 mg by mouth daily.   fish oil-omega-3 fatty acids 1000 MG capsule Take 1 capsule by mouth daily.   Guaifenesin (MUCINEX MAXIMUM STRENGTH) 1200 MG TB12 Take 1,200 mg by mouth 2 (two) times daily.   Loratadine (CLARITIN PO) Take by mouth.   losartan (COZAAR) 100 MG tablet TAKE 1 TABLET BY MOUTH EVERY DAY   OXYGEN Inhale 2 L into the lungs continuous.   sodium chloride (OCEAN) 0.65 % SOLN nasal spray Place 1 spray into both nostrils daily.   traMADol (ULTRAM) 50 MG tablet Take 1 tablet (50 mg total) by mouth every 6 (six) hours as needed (BACK PAIN).   TRELEGY ELLIPTA 100-62.5-25 MCG/INH AEPB INHALE 1 PUFF BY MOUTH EVERY DAY   No facility-administered encounter medications on file as of 05/18/2021.    Allergies (verified) Bee venom, Amlodipine, Levofloxacin, and Hctz [hydrochlorothiazide]   History: Past Medical History:  Diagnosis Date   Anemia  was while doing chemo   Anxiety    Asthma    Chemotherapy-induced neuropathy ([place]) 11/01/2015   Claustrophobia    COPD (chronic obstructive pulmonary disease) ([place])    Depression    Encounter for antineoplastic chemotherapy 02/09/2016   Glaucoma    Headache    prior to menopause   Heart murmur    History of echocardiogram    Echo 2/17: EF 00-86%, grade 1 diastolic dysfunction, mild MR, trivial pericardial effusion   History of nuclear stress test    Myoview 2/17: no ischemia or scar, EF 79%; low risk   History of radiation therapy 10/30/16-11/06/16    Hyperlipidemia    Hypertension    Insomnia 05/16/2016   Non-small cell carcinoma of right lung, stage 2 ([place]) 10/03/2015   Radiation 02/29/16-04/10/16   50.4 Gy to right central chest   Renal cell carcinoma ([place])    L nephrectomy  in 2012   Past Surgical History:  Procedure Laterality Date   BACK SURGERY     cervical 1991   EYE SURGERY     IR GENERIC HISTORICAL  01/16/2017   IR RADIOLOGIST EVAL & MGMT 01/16/2017 [facility]   IR GENERIC HISTORICAL  01/31/2017   IR FLUORO GUIDED NEEDLE PLC ASPIRATION/INJECTION LOC 01/31/2017 Luanne Bras, MD [facility]   IR GENERIC HISTORICAL  01/31/2017   IR VERTEBROPLASTY CERV/THOR BX INC UNI/BIL INC/INJECT/IMAGING 01/31/2017 Luanne Bras, MD [facility]   IR GENERIC HISTORICAL  01/31/2017   IR VERTEBROPLASTY EA ADDL (T&LS) BX INC UNI/BIL INC INJECT/IMAGING 01/31/2017 Luanne Bras, MD [facility]   kidney cancer     NEPHRECTOMY     SPINE SURGERY     TUBAL LIGATION     VIDEO ASSISTED THORACOSCOPY (VATS)/WEDGE RESECTION Right 01/18/2016   Procedure: VIDEO ASSISTED THORACOSCOPY (VATS)/LUNG RESECTION, THOROCOTOMY, RIGHT UPPER LOBECTOMY, LYMPH NODE DISSECTION, PLACEMENT OF ON Q;  Surgeon: [name], MD;  Location: [facility];  Service: Thoracic;  Laterality: Right;   VIDEO BRONCHOSCOPY Bilateral 09/20/2015   Procedure: VIDEO BRONCHOSCOPY WITHOUT FLUORO;  Surgeon: Rigoberto Noel, MD;  Location: [facility];  Service: Cardiopulmonary;  Laterality: Bilateral;   VIDEO BRONCHOSCOPY N/A 01/18/2016   Procedure: VIDEO BRONCHOSCOPY;  Surgeon: [name], MD;  Location[facility];  Service: Thoracic;  Laterality: N/A;   Family History  Problem Relation Age of Onset   Heart disease Sister    Obesity Brother    Heart attack Daughter 74       s/p CABG   Glaucoma Daughter    Breast cancer Sister    Birth defects Sister    Cancer Mother        Bladder Cancer   Hypertension Mother    CAD Mother 16   Cancer Maternal Grandmother    Social History    Socioeconomic History   Marital status: Widowed    Spouse name: Not on file   Number of children: 4   Years of education: Not on file   Highest education level: High school graduate  Occupational History   Not on file  Tobacco Use   Smoking status: Former    Packs/day: 1.00    Years: 50.00    Pack years: 50.00    Types: Cigarettes    Quit date: 03/16/2011    Years since quitting: 10.1   Smokeless tobacco: Never   Tobacco comments:    last 4-5 years of smoking, smoked 0.5 pack/day   Vaping Use   Vaping Use: Never used  Substance  and Sexual Activity   Alcohol use: No    Alcohol/week: 0.0 standard drinks   Drug use: No   Sexual activity: Not Currently  Other Topics Concern   Not on file  Social History Narrative   Marital status: divorced; not dating in 2019.      Children: 4 children; 3 grandchildren adult; 4 gg.      Lives: alone in house      Employment: part-time work; substance abuse counselor; [employer].      Tobacco: quit smoking 2012; smoked 45 years      Alcohol: none      Drugs: none      Exercise: none in 2019; due to LEFT sciatica.      ADLs: independent with ADLs; drives.       Advanced Directives: YES: HCPOA: Nicholas Martinez/son.  FULL CODE but no prolonged measures.      Occupation: Substance Abuse Estate agent   No exercise** Merged History Encounter **       ** Data from: 12/14/11 Enc Dept: [facility]       ** Data from: 12/17/11 Enc Dept: [facility]   Substance abuse counselor   Husband deceased   76 great grandchildren   Son works in same substance abuse counseling center as patient   Social Determinants of Radio broadcast assistant Strain: Low Risk    Difficulty of Paying Living Expenses: Not hard at all  Food Insecurity: No Food Insecurity   Worried About Charity fundraiser in the Last Year: Never true   Arboriculturist in the Last Year: Never true  Transportation Needs: No  Transportation Needs   Lack of Transportation (Medical): No   Lack of Transportation (Non-Medical): No  Physical Activity: Sufficiently Active   Days of Exercise per Week: 5 days   Minutes of Exercise per Session: 30 min  Stress: No Stress Concern Present   Feeling of Stress : Not at all  Social Connections: Moderately Integrated   Frequency of Communication with Friends and Family: More than three times a week   Frequency of Social Gatherings with Friends and Family: More than three times a week   Attends Religious Services: More than 4 times per year   Active Member of [employer] or Organizations: No   Attends Music therapist: More than 4 times per year   Marital Status: Divorced    Tobacco Counseling Counseling given: Not Answered Tobacco comments: last 4-5 years of smoking, smoked 0.5 pack/day    Clinical Intake:  Pre-visit preparation completed: No  Pain : No/denies pain Pain Score: 4      BMI - recorded: 31.89 Nutritional Status: BMI > 30  Obese Nutritional Risks: None Diabetes: No  How often do you need to have someone help you when you read instructions, pamphlets, or other written materials from your doctor or pharmacy?: 1 - Never What is the last grade level you completed in school?: Master's Degree  Diabetic? no  Interpreter Needed?: No  Information entered by :: Lisette Abu, LPN   Activities of Daily Living In your present state of health, do you have any difficulty performing the following activities: 05/18/2021 11/10/2020  Hearing? N N  Vision? N N  Difficulty concentrating or making decisions? N N  Walking or climbing stairs? N N  Dressing or bathing? N N  Doing errands, shopping? N N  Preparing Food and eating ?  N -  Using the Toilet? N -  In the past six months, have you accidently leaked urine? Y -  Comment wears protection -  Do you have problems with loss of bowel control? N -  Managing your Medications? N -  Managing your  Finances? N -  Housekeeping or managing your Housekeeping? N -  Some recent data might be hidden    Patient Care Team: Horald Pollen, MD as PCP - General (Internal Medicine) Curt Bears, MD as Consulting Physician (Oncology) [name], MD (Inactive) as Consulting Physician (Cardiothoracic Surgery) Calvert Cantor, MD as Consulting Physician (Ophthalmology)  Indicate any recent Medical Services you may have received from other than [facility] providers in the past year (date may be approximate).     Assessment:   This is a routine wellness examination for Tammy Ball.  Hearing/Vision screen Hearing Screening - Comments:: Patient denied any hearing difficulty.  Vision Screening - Comments:: Patient wears reading glasses.  History of cataracts.  Eye exam done by Dr. Calvert Cantor.  Dietary issues and exercise activities discussed: Current Exercise Habits: Home exercise routine, Time (Minutes): 30, Frequency (Times/Week): 5, Weekly Exercise (Minutes/Week): 150, Intensity: Mild, Exercise limited by: respiratory conditions(s);psychological condition(s);cardiac condition(s)   Goals Addressed               This Visit's Progress     Patient Stated        My goal is to lose weight to help improve my breathing and back pain.        Patient Stated (pt-stated)        My goal is to lose weight to help improve my breathing  and back pain.       Depression Screen PHQ 2/9 Scores 05/18/2021 02/21/2021 11/22/2020 11/17/2020 03/29/2020 03/07/2020 12/07/2019  PHQ - 2 Score 2 3 0 1 0 0 0  PHQ- 9 Score - 7 - - - - -    Fall Risk Fall Risk  05/18/2021 02/21/2021 11/22/2020 11/17/2020 03/29/2020  Falls in the past year? 1 1 1 1 1   Number falls in past yr: 0 0 0 0 0  Comment - - - - fell out of chair no injury  Injury with Fall? 1 0 0 0 0  Risk for fall due to : - No Fall Risks - History of fall(s);Medication side effect;Impaired balance/gait;Impaired mobility;Impaired vision -  Follow up  Falls evaluation completed Falls evaluation completed Falls evaluation completed Falls evaluation completed;Education provided;Falls prevention discussed Falls evaluation completed;Education provided    FALL RISK PREVENTION PERTAINING TO THE HOME:  Any stairs in or around the home? Yes  If so, are there any without handrails? No  Home free of loose throw rugs in walkways, pet beds, electrical cords, etc? Yes  Adequate lighting in your home to reduce risk of falls? Yes   ASSISTIVE DEVICES UTILIZED TO PREVENT FALLS:  Life alert? No  Use of a cane, walker or w/c? No  Grab bars in the bathroom? Yes  Shower chair or bench in shower? No  Elevated toilet seat or a handicapped toilet? No   TIMED UP AND GO:  Was the test performed? Yes .  Length of time to ambulate 10 feet: 10 sec.   Gait steady and fast without use of assistive device  Cognitive Function: Normal cognitive status assessed by direct observation by this Nurse Health Advisor. No abnormalities found.       6CIT Screen 03/29/2020 11/26/2018 11/13/2017  What Year? 0 points 0 points  0 points  What month? 0 points 0 points 0 points  What time? 0 points 0 points 0 points  Count back from 20 0 points 0 points 0 points  Months in reverse 0 points 0 points 0 points  Repeat phrase 0 points 0 points 0 points  Total Score 0 0 0    Immunizations Immunization History  Administered Date(s) Administered   Fluad Quad(high Dose 65+) 07/18/2019   Hepatitis B 11/12/1998   Influenza Split 09/12/2012   Influenza, High Dose Seasonal PF 08/23/2017, 08/23/2018   Influenza,inj,Quad PF,6+ Mos 07/18/2013   Influenza-Unspecified 07/29/2014, 07/23/2015, 08/25/2016, 07/28/2020   PFIZER(Purple Top)SARS-COV-2 Vaccination 12/24/2019, 01/18/2020, 05/07/2020, 03/23/2021   Pneumococcal Conjugate-13 07/29/2015   Pneumococcal Polysaccharide-23 11/12/2000, 10/23/2012, 08/23/2017   Pneumococcal-Unspecified 10/23/2012, 08/23/2017   Tdap 12/02/2013    Zoster, Live 12/13/2010    TDAP status: Up to date  Flu Vaccine status: Up to date  Pneumococcal vaccine status: Up to date  Covid-19 vaccine status: Completed vaccines  Qualifies for Shingles Vaccine? Yes   Zostavax completed Yes   Shingrix Completed?: No.    Education has been provided regarding the importance of this vaccine. Patient has been advised to call insurance company to determine out of pocket expense if they have not yet received this vaccine. Advised may also receive vaccine at local pharmacy or Health Dept. Verbalized acceptance and understanding.  Screening Tests Health Maintenance  Topic Date Due   Hepatitis C Screening  Never done   Zoster Vaccines- Shingrix (1 of 2) Never done   INFLUENZA VACCINE  06/12/2021   COVID-19 Vaccine (5 - Booster for Pfizer series) 07/24/2021   TETANUS/TDAP  12/03/2023   DEXA SCAN  Completed   PNA vac Low Risk Adult  Completed   HPV VACCINES  Aged Out    Health Maintenance  Health Maintenance Due  Topic Date Due   Hepatitis C Screening  Never done   Zoster Vaccines- Shingrix (1 of 2) Never done    Colorectal cancer screening: Type of screening: Colonoscopy. Completed 05/09/2012. Repeat every 10 years  Mammogram status: Completed 03/21/2018. Repeat every year (patient postponed)  Bone Density status: Completed 05/17/2015. Results reflect: Bone density results: OSTEOPENIA. Repeat every 2 years. (Patient postponed)  Lung Cancer Screening: (Low Dose CT Chest recommended if Age 54-80 years, 30 pack-year currently smoking OR have quit w/in 15years.) does not qualify.   Lung Cancer Screening Referral: no  Additional Screening:  Hepatitis C Screening: does qualify; Completed no  Vision Screening: Recommended annual ophthalmology exams for early detection of glaucoma and other disorders of the eye. Is the patient up to date with their annual eye exam?  Yes  Who is the provider or what is the name of the office in which the patient  attends annual eye exams? Calvert Cantor, OD. If pt is not established with a provider, would they like to be referred to a provider to establish care? No .   Dental Screening: Recommended annual dental exams for proper oral hygiene  Community Resource Referral / Chronic Care Management: CRR required this visit?  No   CCM required this visit?  No      Plan:     I have personally reviewed and noted the following in the patient's chart:   Medical and social history Use of alcohol, tobacco or illicit drugs  Current medications and supplements including opioid prescriptions.  Functional ability and status Nutritional status Physical activity Advanced directives List of other physicians Hospitalizations, surgeries, and ER visits in  previous 12 months Vitals Screenings to include cognitive, depression, and falls Referrals and appointments  In addition, I have reviewed and discussed with patient certain preventive protocols, quality metrics, and best practice recommendations. A written personalized care plan for preventive services as well as general preventive health recommendations were provided to patient.     Sheral Flow, LPN   03/15/3605   Nurse Notes: n/a

## 2021-05-19 ENCOUNTER — Telehealth: Payer: Self-pay | Admitting: Emergency Medicine

## 2021-05-19 ENCOUNTER — Telehealth: Payer: Self-pay

## 2021-05-19 NOTE — Chronic Care Management (AMB) (Signed)
  Chronic Care Management   Note  05/19/2021 Name: Tammy Ball MRN: [id number] DOB: [birth year]  Tammy Ball is a 80 y.o. year old female who is a primary care patient of [name], Ines Bloomer, MD. I reached out to [name] by phone today in response to a referral sent by [name]'s PCP, Horald Pollen, MD.   [name] was given information about Chronic Care Management services today including:  CCM service includes personalized support from designated clinical staff supervised by her physician, including individualized plan of care and coordination with other care providers 24/7 contact phone numbers for assistance for urgent and routine care needs. Service will only be billed when office clinical staff spend 20 minutes or more in a month to coordinate care. Only one practitioner may furnish and bill the service in a calendar month. The patient may stop CCM services at any time (effective at the end of the month) by phone call to the office staff.   Patient agreed to services and verbal consent obtained.   Follow up plan:   [name] Upstream Scheduler

## 2021-05-19 NOTE — Chronic Care Management (AMB) (Signed)
CCM referral has been placed   Tomasa Blase, PharmD Clinical Pharmacist, [facility]

## 2021-05-21 DIAGNOSIS — Z20822 Contact with and (suspected) exposure to covid-19: Secondary | ICD-10-CM | POA: Diagnosis not present

## 2021-06-05 ENCOUNTER — Ambulatory Visit (HOSPITAL_COMMUNITY)
Admission: RE | Admit: 2021-06-05 | Discharge: 2021-06-05 | Disposition: A | Discharge status: Home / Self Care | Payer: Medicare Other | Source: Ambulatory Visit | Attending: Internal Medicine | Admitting: Internal Medicine

## 2021-06-05 ENCOUNTER — Other Ambulatory Visit: Payer: Self-pay

## 2021-06-05 ENCOUNTER — Inpatient Hospital Stay: Payer: Medicare Other | Attending: Internal Medicine

## 2021-06-05 DIAGNOSIS — I700 Atherosclerosis of aorta: Secondary | ICD-10-CM | POA: Diagnosis not present

## 2021-06-05 DIAGNOSIS — Z7982 Long term (current) use of aspirin: Secondary | ICD-10-CM | POA: Insufficient documentation

## 2021-06-05 DIAGNOSIS — J449 Chronic obstructive pulmonary disease, unspecified: Secondary | ICD-10-CM | POA: Insufficient documentation

## 2021-06-05 DIAGNOSIS — C3490 Malignant neoplasm of unspecified part of unspecified bronchus or lung: Secondary | ICD-10-CM | POA: Insufficient documentation

## 2021-06-05 DIAGNOSIS — I10 Essential (primary) hypertension: Secondary | ICD-10-CM | POA: Insufficient documentation

## 2021-06-05 DIAGNOSIS — C3411 Malignant neoplasm of upper lobe, right bronchus or lung: Secondary | ICD-10-CM | POA: Insufficient documentation

## 2021-06-05 DIAGNOSIS — R911 Solitary pulmonary nodule: Secondary | ICD-10-CM | POA: Diagnosis not present

## 2021-06-05 DIAGNOSIS — Z79899 Other long term (current) drug therapy: Secondary | ICD-10-CM | POA: Insufficient documentation

## 2021-06-05 DIAGNOSIS — G4700 Insomnia, unspecified: Secondary | ICD-10-CM | POA: Diagnosis not present

## 2021-06-05 DIAGNOSIS — J439 Emphysema, unspecified: Secondary | ICD-10-CM | POA: Diagnosis not present

## 2021-06-05 DIAGNOSIS — Z923 Personal history of irradiation: Secondary | ICD-10-CM | POA: Diagnosis not present

## 2021-06-05 DIAGNOSIS — C642 Malignant neoplasm of left kidney, except renal pelvis: Secondary | ICD-10-CM | POA: Insufficient documentation

## 2021-06-05 DIAGNOSIS — Z9221 Personal history of antineoplastic chemotherapy: Secondary | ICD-10-CM | POA: Diagnosis not present

## 2021-06-05 DIAGNOSIS — E785 Hyperlipidemia, unspecified: Secondary | ICD-10-CM | POA: Insufficient documentation

## 2021-06-05 LAB — CBC WITH DIFFERENTIAL (CANCER CENTER ONLY)
Abs Immature Granulocytes: 0.02 10*3/uL (ref 0.00–0.07)
Basophils Absolute: 0.1 10*3/uL (ref 0.0–0.1)
Basophils Relative: 1 %
Eosinophils Absolute: 0.2 10*3/uL (ref 0.0–0.5)
Eosinophils Relative: 3 %
HCT: 39.1 % (ref 36.0–46.0)
Hemoglobin: 12.9 g/dL (ref 12.0–15.0)
Immature Granulocytes: 0 %
Lymphocytes Relative: 19 %
Lymphs Abs: 1.4 10*3/uL (ref 0.7–4.0)
MCH: 30.5 pg (ref 26.0–34.0)
MCHC: 33 g/dL (ref 30.0–36.0)
MCV: 92.4 fL (ref 80.0–100.0)
Monocytes Absolute: 0.7 10*3/uL (ref 0.1–1.0)
Monocytes Relative: 9 %
Neutro Abs: 5 10*3/uL (ref 1.7–7.7)
Neutrophils Relative %: 68 %
Platelet Count: 248 10*3/uL (ref 150–400)
RBC: 4.23 MIL/uL (ref 3.87–5.11)
RDW: 16.5 % — ABNORMAL HIGH (ref 11.5–15.5)
WBC Count: 7.4 10*3/uL (ref 4.0–10.5)
nRBC: 0 % (ref 0.0–0.2)

## 2021-06-05 LAB — CMP (CANCER CENTER ONLY)
ALT: 14 U/L (ref 0–44)
AST: 18 U/L (ref 15–41)
Albumin: 3.9 g/dL (ref 3.5–5.0)
Alkaline Phosphatase: 98 U/L (ref 38–126)
Anion gap: 8 (ref 5–15)
BUN: 17 mg/dL (ref 8–23)
CO2: 28 mmol/L (ref 22–32)
Calcium: 10 mg/dL (ref 8.9–10.3)
Chloride: 104 mmol/L (ref 98–111)
Creatinine: 0.98 mg/dL (ref 0.44–1.00)
GFR, Estimated: 59 mL/min — ABNORMAL LOW (ref >60)
Glucose, Bld: 101 mg/dL — ABNORMAL HIGH (ref 70–99)
Potassium: 4.7 mmol/L (ref 3.5–5.1)
Sodium: 140 mmol/L (ref 135–145)
Total Bilirubin: 0.7 mg/dL (ref 0.3–1.2)
Total Protein: 7 g/dL (ref 6.5–8.1)

## 2021-06-05 MED ORDER — IOHEXOL 350 MG/ML SOLN
100.0000 mL | Freq: Once | INTRAVENOUS | Status: AC | PRN
  Administered 2021-06-05: 50 mL via INTRAVENOUS

## 2021-06-07 ENCOUNTER — Other Ambulatory Visit: Payer: Self-pay

## 2021-06-07 ENCOUNTER — Inpatient Hospital Stay (HOSPITAL_BASED_OUTPATIENT_CLINIC_OR_DEPARTMENT_OTHER): Payer: Medicare Other | Admitting: Internal Medicine

## 2021-06-07 VITALS — BP 163/78 | HR 71 | Temp 97.2°F | Resp 20 | Ht 64.0 in | Wt 187.2 lb

## 2021-06-07 DIAGNOSIS — E785 Hyperlipidemia, unspecified: Secondary | ICD-10-CM | POA: Diagnosis not present

## 2021-06-07 DIAGNOSIS — C3490 Malignant neoplasm of unspecified part of unspecified bronchus or lung: Secondary | ICD-10-CM

## 2021-06-07 DIAGNOSIS — I10 Essential (primary) hypertension: Secondary | ICD-10-CM | POA: Diagnosis not present

## 2021-06-07 DIAGNOSIS — C642 Malignant neoplasm of left kidney, except renal pelvis: Secondary | ICD-10-CM | POA: Diagnosis not present

## 2021-06-07 DIAGNOSIS — G4700 Insomnia, unspecified: Secondary | ICD-10-CM | POA: Diagnosis not present

## 2021-06-07 DIAGNOSIS — J449 Chronic obstructive pulmonary disease, unspecified: Secondary | ICD-10-CM | POA: Diagnosis not present

## 2021-06-07 DIAGNOSIS — C3411 Malignant neoplasm of upper lobe, right bronchus or lung: Secondary | ICD-10-CM | POA: Diagnosis not present

## 2021-06-07 NOTE — Progress Notes (Signed)
[place] [phone]   [phone]  OFFICE PROGRESS NOTE  Horald Pollen, MD [place] [address]  DIAGNOSIS: Stage IIIA (T2a, N2, M0) non-small cell lung cancer, squamous cell carcinoma presented with large right upper lobe lung mass with right hilar involvement diagnosed in November 2016.  PRIOR THERAPY:  1) Neoadjuvant systemic chemotherapy with carboplatin for AUC of 6 and paclitaxel 200 MG/M2 every 3 weeks, status post 3 cycles with partial response. 2) Video bronchoscopy, right video-assisted thoracoscopy, minithoracotomy, right upper lobectomy with lymph node dissection on 01/18/2016. The final pathology revealed residual squamous cell carcinoma measuring 3.1 cm with close resection margin and metastatic carcinoma to lymph nodes at levels 10R, 12R and 4R. (ypT2a, yN2).  3) Concurrent chemoradiation with weekly carboplatin for AUC of 2 and paclitaxel 45 MG/M2. First dose 02/27/2016. Status post 5 cycles. Last dose was given 03/26/2016. 4) SBRT to left lower lobe lung nodule under the care of Dr. Sondra Come completed on 11/06/2016.  CURRENT THERAPY: Observation.  INTERVAL HISTORY: Tammy Ball 80 y.o. female returns to the clinic today for 43-month follow-up visit.  The patient is feeling fine today with no concerning complaints except for the baseline shortness of breath and she is currently on home oxygen.  She denied having any current chest pain, cough or hemoptysis.  She denied having any fever or chills.  She has no nausea, vomiting, diarrhea or constipation.  She has no headache or visual changes.  She is here today for evaluation with repeat CT scan of the chest for restaging of her disease.  MEDICAL HISTORY: Past Medical History:  Diagnosis Date   Anemia    was while doing chemo   Anxiety    Asthma    Chemotherapy-induced neuropathy ([place]) 11/01/2015   Claustrophobia    COPD (chronic obstructive pulmonary disease)  ([place])    Depression    Encounter for antineoplastic chemotherapy 02/09/2016   Glaucoma    Headache    prior to menopause   Heart murmur    History of echocardiogram    Echo 2/17: EF 17-00%, grade 1 diastolic dysfunction, mild MR, trivial pericardial effusion   History of nuclear stress test    Myoview 2/17: no ischemia or scar, EF 79%; low risk   History of radiation therapy 10/30/16-11/06/16   Hyperlipidemia    Hypertension    Insomnia 05/16/2016   Non-small cell carcinoma of right lung, stage 2 ([place]) 10/03/2015   Radiation 02/29/16-04/10/16   50.4 Gy to right central chest   Renal cell carcinoma ([place])    L nephrectomy  in 2012    ALLERGIES:  is allergic to bee venom, amlodipine, levofloxacin, and hctz [hydrochlorothiazide].  MEDICATIONS:  Current Outpatient Medications  Medication Sig Dispense Refill   acetaminophen (TYLENOL) 500 MG tablet Take 500 mg by mouth every 6 (six) hours as needed for mild pain, moderate pain, fever or headache. Reported on 05/24/2016     albuterol (PROVENTIL) (2.5 MG/3ML) 0.083% nebulizer solution Take 3 mLs (2.5 mg total) by nebulization every 6 (six) hours as needed for wheezing or shortness of breath. 75 mL 6   albuterol (VENTOLIN HFA) 108 (90 Base) MCG/ACT inhaler Inhale 1-2 puffs into the lungs every 6 (six) hours as needed for wheezing or shortness of breath. 1 Inhaler 6   Ascorbic Acid (VITAMIN C) 1000 MG tablet Take 1,000 mg by mouth daily.     aspirin EC 81 MG tablet Take 81 mg by  mouth daily.     atorvastatin (LIPITOR) 40 MG tablet Take 1 tablet (40 mg total) by mouth daily. 90 tablet 3   calcium carbonate (TUMS EX) 750 MG chewable tablet Chew 1 tablet by mouth daily as needed for heartburn.     Carboxymethylcellulose Sodium (ARTIFICIAL TEARS OP) Place 1 drop into both eyes daily as needed (dry eyes).     carvedilol (COREG) 12.5 MG tablet Take 12.5 mg by mouth 2 (two) times daily.     Cholecalciferol (VITAMIN D3 PO) Take 1,200 mg by mouth daily.      fish oil-omega-3 fatty acids 1000 MG capsule Take 1 capsule by mouth daily.     Guaifenesin (MUCINEX MAXIMUM STRENGTH) 1200 MG TB12 Take 1,200 mg by mouth 2 (two) times daily.     Loratadine (CLARITIN PO) Take by mouth.     losartan (COZAAR) 100 MG tablet TAKE 1 TABLET BY MOUTH EVERY DAY 90 tablet 1   OXYGEN Inhale 2 L into the lungs continuous.     sodium chloride (OCEAN) 0.65 % SOLN nasal spray Place 1 spray into both nostrils daily.     traMADol (ULTRAM) 50 MG tablet Take 1 tablet (50 mg total) by mouth every 6 (six) hours as needed (BACK PAIN). 90 tablet 0   TRELEGY ELLIPTA 100-62.5-25 MCG/INH AEPB INHALE 1 PUFF BY MOUTH EVERY DAY 180 each 3   No current facility-administered medications for this visit.    SURGICAL HISTORY:  Past Surgical History:  Procedure Laterality Date   BACK SURGERY     cervical 1991   EYE SURGERY     IR GENERIC HISTORICAL  01/16/2017   IR RADIOLOGIST EVAL & MGMT 01/16/2017 [facility]   IR GENERIC HISTORICAL  01/31/2017   IR FLUORO GUIDED NEEDLE PLC ASPIRATION/INJECTION LOC 01/31/2017 Luanne Bras, MD [facility]   IR GENERIC HISTORICAL  01/31/2017   IR VERTEBROPLASTY CERV/THOR BX INC UNI/BIL INC/INJECT/IMAGING 01/31/2017 Luanne Bras, MD [facility]   IR GENERIC HISTORICAL  01/31/2017   IR VERTEBROPLASTY EA ADDL (T&LS) BX INC UNI/BIL INC INJECT/IMAGING 01/31/2017 Luanne Bras, MD [facility]   kidney cancer     NEPHRECTOMY     SPINE SURGERY     TUBAL LIGATION     VIDEO ASSISTED THORACOSCOPY (VATS)/WEDGE RESECTION Right 01/18/2016   Procedure: VIDEO ASSISTED THORACOSCOPY (VATS)/LUNG RESECTION, THOROCOTOMY, RIGHT UPPER LOBECTOMY, LYMPH NODE DISSECTION, PLACEMENT OF ON Q;  Surgeon: [name], MD;  Location: [facility];  Service: Thoracic;  Laterality: Right;   VIDEO BRONCHOSCOPY Bilateral 09/20/2015   Procedure: VIDEO BRONCHOSCOPY WITHOUT FLUORO;  Surgeon: Rigoberto Noel, MD;  Location: [facility];  Service: Cardiopulmonary;  Laterality:  Bilateral;   VIDEO BRONCHOSCOPY N/A 01/18/2016   Procedure: VIDEO BRONCHOSCOPY;  Surgeon: [name], MD;  Location: [facility];  Service: Thoracic;  Laterality: N/A;    REVIEW OF SYSTEMS:  A comprehensive review of systems was negative except for: Constitutional: positive for fatigue Respiratory: positive for dyspnea on exertion   PHYSICAL EXAMINATION: General appearance: alert, cooperative and no distress Head: Normocephalic, without obvious abnormality, atraumatic Neck: no adenopathy, no JVD, supple, symmetrical, trachea midline and thyroid not enlarged, symmetric, no tenderness/mass/nodules Lymph nodes: Cervical, supraclavicular, and axillary nodes normal. Resp: clear to auscultation bilaterally Back: symmetric, no curvature. ROM normal. No CVA tenderness. Cardio: regular rate and rhythm, S1, S2 normal, no murmur, click, rub or gallop GI: soft, non-tender; bowel sounds normal; no masses,  no organomegaly Extremities: extremities normal, atraumatic, no cyanosis or edema  ECOG  PERFORMANCE STATUS: 1 - Symptomatic but completely ambulatory  Blood pressure (!) 163/78, pulse 71, temperature (!) 97.2 F (36.2 C), temperature source Tympanic, resp. rate 20, height 5\' 4"  (1.626 m), weight 187 lb 3.2 oz (84.9 kg), SpO2 98 %.  LABORATORY DATA: Lab Results  Component Value Date   WBC 7.4 06/05/2021   HGB 12.9 06/05/2021   HCT 39.1 06/05/2021   MCV 92.4 06/05/2021   PLT 248 06/05/2021      Chemistry      Component Value Date/Time   NA 140 06/05/2021 1051   NA 140 11/21/2018 1058   NA 140 11/11/2017 0939   K 4.7 06/05/2021 1051   K 4.6 11/11/2017 0939   CL 104 06/05/2021 1051   CO2 28 06/05/2021 1051   CO2 28 11/11/2017 0939   BUN 17 06/05/2021 1051   BUN 23 11/21/2018 1058   BUN 30.3 (H) 11/11/2017 0939   CREATININE 0.98 06/05/2021 1051   CREATININE 1.3 (H) 11/11/2017 0939   GLU 105 12/10/2009 0000      Component Value Date/Time   CALCIUM 10.0 06/05/2021 1051   CALCIUM  10.0 11/11/2017 0939   ALKPHOS 98 06/05/2021 1051   ALKPHOS 102 11/11/2017 0939   AST 18 06/05/2021 1051   AST 18 11/11/2017 0939   ALT 14 06/05/2021 1051   ALT 19 11/11/2017 0939   BILITOT 0.7 06/05/2021 1051   BILITOT 0.42 11/11/2017 0939       RADIOGRAPHIC STUDIES: CT Chest W Contrast  Result Date: 06/06/2021 CLINICAL DATA:  Primary Cancer Type: Non-small cell lung cancer Imaging Indication: Routine surveillance Interval therapy since last imaging? No Initial Cancer Diagnosis Date: 09/20/2015; Established by: Biopsy-proven Detailed Pathology: Stage IIIA non-small cell lung cancer, squamous cell carcinoma. Primary Tumor location:  Right upper lobe; left lower lobe nodule. Surgeries: Right upper lobectomy 01/18/2016. Vertebroplasty T3 and T4. Chemotherapy: Yes; Ongoing? No; Most recent administration: 03/26/2016 Immunotherapy? No Radiation therapy? Yes Date Range: 10/30/2016 - 11/06/2016; Target:  Left lower lung Date Range: 02/29/2020 - 04/10/2016; Target: Right central chest Other Cancer Therapies: Left renal cell carcinoma; nephrectomy 2012. EXAM: CT CHEST WITH CONTRAST TECHNIQUE: Multidetector CT imaging of the chest was performed during intravenous contrast administration. CONTRAST:  50mL OMNIPAQUE IOHEXOL 350 MG/ML SOLN COMPARISON:  Most recent CT chest 12/02/2020.  10/03/2016 PET-CT. FINDINGS: Cardiovascular: The heart is normal in size and stable. No pericardial effusion. There is stable tortuosity, ectasia and calcification of the thoracic aorta. Stable coronary artery calcifications. Mediastinum/Nodes: Stable scattered mediastinal and hilar lymph nodes but no mass or overt adenopathy. The esophagus is grossly normal. Lungs/Pleura: Stable underlying emphysematous changes. Stable surgical changes from a right upper lobe lobectomy. Stable area of dense radiation fibrosis involving the right hilum and paramediastinal lung. I do not see any findings suspicious for recurrent tumor. Persistent  left lower lobe pulmonary lesion measuring a maximum of 2.3 x 2.0 cm on image number 105/7. This measured 2.4 x 2.1 cm on the prior CT when remeasured with the same imaging planes. No new pulmonary lesions or pulmonary nodules. No pleural effusions or pleural nodules. Upper Abdomen: No significant upper abdominal findings. Status post left nephrectomy. Musculoskeletal: No breast masses, supraclavicular or axillary adenopathy. The bony thorax is intact. No worrisome bone lesions. Vertebral augmentation changes are noted in the upper thoracic spine. IMPRESSION: 1. Stable surgical changes from a right upper lobe lobectomy and dense radiation fibrosis involving the right hilum and paramediastinal lung. No findings suspicious for recurrent tumor. 2. Stable left lower  lobe treated pulmonary lesion. No new or progressive findings. 3. Stable emphysematous changes. 4. No mediastinal or hilar mass or adenopathy. 5. Stable atherosclerotic calcifications involving the thoracic aorta and branch vessels including the coronary arteries. 6. Emphysema and aortic atherosclerosis. Aortic Atherosclerosis (ICD10-I70.0) and Emphysema (ICD10-J43.9). Electronically Signed   By: Marijo Sanes M.D.   On: 06/06/2021 10:32     ASSESSMENT AND PLAN:  This is a very pleasant 80 years old white female with a stage IIIa non-small cell lung cancer, squamous cell carcinoma status post neoadjuvant systemic chemotherapy was carboplatin and paclitaxel with partial response followed by right upper lobectomy and lymph node dissection. She was found to have evidence of metastatic disease to the mediastinal lymph nodes and close resection margin. The patient underwent a course of concurrent chemoradiation with weekly carboplatin and paclitaxel. She was also treated with SBRT left lower lobe pulmonary nodule under the care of Dr. Sondra Come. The patient has been on observation for more than 5 years.   She has no concerning complaints today except for the  baseline shortness of breath and she is currently on home oxygen. She had repeat CT scan of the chest performed recently.  I personally and independently reviewed the scan and discussed the results with the patient today. Her scan showed no concerning findings for disease progression. I recommended for her to continue on observation with repeat CT scan of the chest in 6 months. The patient will continue her routine follow-up visit and evaluation by pulmonary medicine for her COPD. She was advised to call immediately if she has any other concerning symptoms in the interval. The patient voices understanding of current disease status and treatment options and is in agreement with the current care plan. All questions were answered. The patient knows to call the clinic with any problems, questions or concerns. We can certainly see the patient much sooner if necessary.  Disclaimer: This note was dictated with voice recognition software. Similar sounding words can inadvertently be transcribed and may not be corrected upon review.

## 2021-06-12 ENCOUNTER — Telehealth: Payer: Medicare Other | Admitting: Physician Assistant

## 2021-06-12 DIAGNOSIS — B9689 Other specified bacterial agents as the cause of diseases classified elsewhere: Secondary | ICD-10-CM | POA: Diagnosis not present

## 2021-06-12 DIAGNOSIS — J069 Acute upper respiratory infection, unspecified: Secondary | ICD-10-CM

## 2021-06-12 MED ORDER — AZITHROMYCIN 250 MG PO TABS: ORAL_TABLET | ORAL | 0 refills | Status: DC

## 2021-06-12 MED ORDER — BENZONATATE 100 MG PO CAPS
100.0000 mg | ORAL_CAPSULE | Freq: Three times a day (TID) | ORAL | 0 refills | Status: DC | PRN
Start: 2021-06-12 — End: 2021-06-26

## 2021-06-12 NOTE — Progress Notes (Signed)
We are sorry that you are not feeling well.  Here is how we plan to help!  Based on your presentation I believe you most likely have A cough due to bacteria.  When patients have a fever and a productive cough with a change in color or increased sputum production, we are concerned about bacterial bronchitis.  If left untreated it can progress to pneumonia.  If your symptoms do not improve with your treatment plan it is important that you contact your provider.   I have prescribed Azithromyin 250 mg: two tablets now and then one tablet daily for 4 additonal days    In addition you may use A prescription cough medication called Tessalon Perles 100mg . You may take 1-2 capsules every 8 hours as needed for your cough.   From your responses in the eVisit questionnaire you describe inflammation in the upper respiratory tract which is causing a significant cough.  This is commonly called Bronchitis and has four common causes:   Allergies Viral Infections Acid Reflux Bacterial Infection Allergies, viruses and acid reflux are treated by controlling symptoms or eliminating the cause. An example might be a cough caused by taking certain blood pressure medications. You stop the cough by changing the medication. Another example might be a cough caused by acid reflux. Controlling the reflux helps control the cough.  USE OF BRONCHODILATOR ("RESCUE") INHALERS: There is a risk from using your bronchodilator too frequently.  The risk is that over-reliance on a medication which only relaxes the muscles surrounding the breathing tubes can reduce the effectiveness of medications prescribed to reduce swelling and congestion of the tubes themselves.  Although you feel brief relief from the bronchodilator inhaler, your asthma may actually be worsening with the tubes becoming more swollen and filled with mucus.  This can delay other crucial treatments, such as oral steroid medications. If you need to use a bronchodilator  inhaler daily, several times per day, you should discuss this with your provider.  There are probably better treatments that could be used to keep your asthma under control.     HOME CARE Only take medications as instructed by your medical team. Complete the entire course of an antibiotic. Drink plenty of fluids and get plenty of rest. Avoid close contacts especially the very young and the elderly Cover your mouth if you cough or cough into your sleeve. Always remember to wash your hands A steam or ultrasonic humidifier can help congestion.   GET HELP RIGHT AWAY IF: You develop worsening fever. You become short of breath You cough up blood. Your symptoms persist after you have completed your treatment plan MAKE SURE YOU  Understand these instructions. Will watch your condition. Will get help right away if you are not doing well or get worse.    Thank you for choosing an e-visit.  Your e-visit answers were reviewed by a board certified advanced clinical practitioner to complete your personal care plan. Depending upon the condition, your plan could have included both over the counter or prescription medications.  Please review your pharmacy choice. Make sure the pharmacy is open so you can pick up prescription now. If there is a problem, you may contact your provider through [employer] and have the prescription routed to another pharmacy.  Your safety is important to [place]. If you have drug allergies check your prescription carefully.   For the next 24 hours you can use MyChart to ask questions about today's visit, request a non-urgent call back, or ask  for a work or school excuse. You will get an email in the next two days asking about your experience. I hope that your e-visit has been valuable and will speed your recovery.  I provided 6 minutes of non face-to-face time during this encounter for chart review and documentation.

## 2021-06-13 ENCOUNTER — Telehealth: Payer: Self-pay

## 2021-06-13 NOTE — Chronic Care Management (AMB) (Signed)
Chronic Care Management Pharmacy Assistant   Name: Tammy Ball  MRN: [id number] DOB: 1941-06-10  Tammy Ball is an 80 y.o. year old female who presents for his initial CCM visit with the clinical pharmacist.  Recent office visits:  02/21/21-Miguel Joellen Jersey, MD (PCP) 3 month follow up. Follow up in 6 months.  Recent consult visits:  06/07/21-Mohamed Julien Nordmann, MD (Oncology) [facility] or Non-small cell lung cancer. Recommended for her to continue on observation with repeat CT scan of the chest in 6 months. Follow up in 6 months. 01/04/21-Robert Agustina Caroli, MD (Pulmonology) Follow up visit. Hospital visits:  None in previous 6 months  Medications: Outpatient Encounter Medications as of 06/13/2021  Medication Sig Note   acetaminophen (TYLENOL) 500 MG tablet Take 500 mg by mouth every 6 (six) hours as needed for mild pain, moderate pain, fever or headache. Reported on 05/24/2016    albuterol (PROVENTIL) (2.5 MG/3ML) 0.083% nebulizer solution Take 3 mLs (2.5 mg total) by nebulization every 6 (six) hours as needed for wheezing or shortness of breath.    albuterol (VENTOLIN HFA) 108 (90 Base) MCG/ACT inhaler Inhale 1-2 puffs into the lungs every 6 (six) hours as needed for wheezing or shortness of breath.    Ascorbic Acid (VITAMIN C) 1000 MG tablet Take 1,000 mg by mouth daily.    aspirin EC 81 MG tablet Take 81 mg by mouth daily.    atorvastatin (LIPITOR) 40 MG tablet Take 1 tablet (40 mg total) by mouth daily.    azithromycin (ZITHROMAX) 250 MG tablet Take 2 tablets PO on day one, and one tablet PO daily thereafter until completed.    benzonatate (TESSALON) 100 MG capsule Take 1 capsule (100 mg total) by mouth 3 (three) times daily as needed.    calcium carbonate (TUMS EX) 750 MG chewable tablet Chew 1 tablet by mouth daily as needed for heartburn.    Carboxymethylcellulose Sodium (ARTIFICIAL TEARS OP) Place 1 drop into both eyes daily as needed (dry eyes).    carvedilol (COREG) 12.5 MG  tablet Take 12.5 mg by mouth 2 (two) times daily.    Cholecalciferol (VITAMIN D3 PO) Take 1,200 mg by mouth daily.    fish oil-omega-3 fatty acids 1000 MG capsule Take 1 capsule by mouth daily.    Guaifenesin (MUCINEX MAXIMUM STRENGTH) 1200 MG TB12 Take 1,200 mg by mouth 2 (two) times daily.    Loratadine (CLARITIN PO) Take by mouth. 02/21/2021: Non drowsy   losartan (COZAAR) 100 MG tablet TAKE 1 TABLET BY MOUTH EVERY DAY    OXYGEN Inhale 2 L into the lungs continuous. 02/21/2021: use   sodium chloride (OCEAN) 0.65 % SOLN nasal spray Place 1 spray into both nostrils daily.    traMADol (ULTRAM) 50 MG tablet Take 1 tablet (50 mg total) by mouth every 6 (six) hours as needed (BACK PAIN).    TRELEGY ELLIPTA 100-62.5-25 MCG/INH AEPB INHALE 1 PUFF BY MOUTH EVERY DAY    No facility-administered encounter medications on file as of 06/13/2021.   Acetaminophen (TYLENOL) 500 MG tablet Last filled: None noted Albuterol (PROVENTIL) (2.5 MG/3ML) 0.083% nebulizer solution Last filled:12/08/20 10 DS Albuterol (VENTOLIN HFA) 108 (90 Base) MCG/ACT inhaler Last filled:02/13/19 30 DS Ascorbic Acid (VITAMIN C) 1000 MG tablet Last filled:None noted Aspirin EC 81 MG tablet Last filled:None noted Atorvastatin (LIPITOR) 40 MG tablet Last filled:05/08/21 90 DS Azithromycin (ZITHROMAX) 250 MG tablet Last filled:06/12/21 5 DS Benzonatate (TESSALON) 100 MG capsule Last filled:01/13/20 6 DS Calcium carbonate (TUMS EX)  750 MG chewable tablet Last filled:None noted Carboxymethylcellulose Sodium (ARTIFICIAL TEARS OP) Last filled: None noted Carvedilol (COREG) 12.5 MG tablet Last filled:06/12/21 30 DS Cholecalciferol (VITAMIN D3 PO) Last filled:None noted Fish oil-omega-3 fatty acids 1000 MG capsule Last filled:None noted Guaifenesin (MUCINEX MAXIMUM STRENGTH) 1200 MG TB12 Last filled:None noted Loratadine (CLARITIN PO) Last filled:None noted Losartan (COZAAR) 100 MG tablet Last filled:04/28/21 90 DS Sodium chloride (OCEAN)  0.65 % SOLN nasal spray Last filled:None noted TraMADol (ULTRAM) 50 MG tablet Last filled:06/01/19 7 DS TRELEGY ELLIPTA 100-62.5-25 MCG/INH AEPB Last filled:None noted   Star Rating Drugs: Losartan (COZAAR) 100 MG tablet Last filled:04/28/21 90 DS Atorvastatin (LIPITOR) 40 MG tablet Last filled:05/08/21 90 DS  Myriam Elta [place], [place]

## 2021-06-26 ENCOUNTER — Other Ambulatory Visit: Payer: Self-pay

## 2021-06-26 ENCOUNTER — Ambulatory Visit (INDEPENDENT_AMBULATORY_CARE_PROVIDER_SITE_OTHER): Payer: Medicare Other

## 2021-06-26 DIAGNOSIS — E782 Mixed hyperlipidemia: Secondary | ICD-10-CM | POA: Diagnosis not present

## 2021-06-26 DIAGNOSIS — J449 Chronic obstructive pulmonary disease, unspecified: Secondary | ICD-10-CM | POA: Diagnosis not present

## 2021-06-26 DIAGNOSIS — I10 Essential (primary) hypertension: Secondary | ICD-10-CM | POA: Diagnosis not present

## 2021-06-26 DIAGNOSIS — N1831 Chronic kidney disease, stage 3a: Secondary | ICD-10-CM

## 2021-06-26 DIAGNOSIS — I5032 Chronic diastolic (congestive) heart failure: Secondary | ICD-10-CM | POA: Diagnosis not present

## 2021-06-26 NOTE — Patient Instructions (Signed)
Visit Information   PATIENT GOALS:   Goals Addressed             This Visit's Progress    Prevent Falls and Broken Bones-Osteopenia       Timeframe:  Long-Range Goal Priority:  High Start Date:   06/26/2021                          Expected End Date:  06/26/2022                     Follow Up Date 09/26/2021   - always use handrails on the stairs - always wear shoes or slippers with non-slip sole - get at least 10 minutes of activity every day - keep cell phone with me always - make an emergency alert plan in case I fall - pick up clutter from the floors - remove, or use a non-slip pad, with my throw rugs - wear low heeled or flat shoes with non-skid soles    Why is this important?   When you fall, there are 3 things that control if a bone breaks or not.  These are the fall itself, how hard and the direction that you fall and how fragile your bones are.  Preventing falls is very important for you because of fragile bones.       Track and Manage My Blood Pressure-Hypertension          Consent to CCM Services: Ms. Charters was given information about Chronic Care Management services including:  CCM service includes personalized support from designated clinical staff supervised by her physician, including individualized plan of care and coordination with other care providers 24/7 contact phone numbers for assistance for urgent and routine care needs. Service will only be billed when office clinical staff spend 20 minutes or more in a month to coordinate care. Only one practitioner may furnish and bill the service in a calendar month. The patient may stop CCM services at any time (effective at the end of the month) by phone call to the office staff. The patient will be responsible for cost sharing (co-pay) of up to 20% of the service fee (after annual deductible is met).  Patient agreed to services and verbal consent obtained.   Patient verbalizes understanding of instructions  provided today and agrees to view in [place].   Telephone follow up appointment with care management team member scheduled for: 4-6 weeks  The patient has been provided with contact information for the care management team and has been advised to call with any health related questions or concerns.   Tomasa Blase, PharmD Clinical Pharmacist, [place]   CLINICAL CARE PLAN: Patient Care Plan: Wellness (Adult)     Problem Identified: Health Literacy (Wellness)   Priority: Medium     Goal: Health Literacy Improved Completed 12/12/2020  Start Date: 12/05/2020  Expected End Date: 01/02/2021  Recent Progress: On track  Priority: Medium  Note:   Evidence-based guidance:  Assess health literacy using Institute of [employer] recommended questions or standardized tools.  Use a universal method with health literacy to esure a nonjudgmental approach to varying levels of literacy.  Identify barriers to seeking or understanding information.  Note: Patient barriers may include literacy, language or culture, mental health, stigma, embarrassment; clinician barriers may include avoidance, time constraint, stereotype or bias.  Collaborate with interprofessional team and child and parent/caregiver to develop a structured yet individualized education plan that  supports shared decision-making.  Consider a combination of strategies such as print, audiotape, video and computer-based learning in a group or individual setting.  Encourage patient to seek health information and education in the community such as Art therapist, continuing education, English as a second language class, support group or peer Engineer, maintenance.  Provide educational materials that are written in plain language (3rd to 5th grade reading level) and include icons, pictures and tables; provide essential information first.   Notes:   1/31 - Case closed    Task: Identify Deficit and Heil Completed 12/12/2020  Due Date:  12/17/2020  Note:   Care Management Activities:    - education plan reviewed and/or amended - empathy and reassurance conveyed - family/caregiver participation in learning encouraged - health literacy screen reviewed - patient's preferred learning methods utilized - privacy ensured - questions encouraged - readiness to learn monitored    Notes:     Problem Identified: Medication Adherence (Wellness)   Priority: High     Goal: Medication Adherence Maintained Completed 12/12/2020  Start Date: 12/05/2020  Expected End Date: 01/02/2021  Recent Progress: On track  Priority: High  Note:   Evidence-based guidance:  Develop a complete and accurate medication list including those prescribed and over-the-counter, those taken only occasionally and those not taken by mouth such as injections, inhalers, ointments or creams and drops.  Review all medications to determine if patient or caregiver knows why the medications are given and if taken as prescribed.  Complete or review a medication adherence assessment including barriers to medication adherence.  Arrange and encourage counseling and medication review by pharmacist.  Assess barriers to medication adherence.  Manage poor understanding or health literacy by using easy to understand language, teach-back, visual aids and teaching only 2 or 3 points at a time.  Assess presence of side effects; provide suggestions to manage or reduce side effects.  Consult with provider and/or pharmacist regarding substitute medication, changing dose, simplification of regimen or safe discontinuation of some medications.  Encourage the use of medication reminders such as clock or cell phone alarm, color coding, pillboxes for am/pm and days of the week, pharmacy refill reminder, auto-refill system or mail-order option.  Assist with resources when cost is a barrier; refer to prescription assistance programs; confirm that generics are prescribed whenever possible;  consider 90-day prescriptions to reduce copay cost; synchronize refills.  Provide help to complete medication assistance applications or health insurance forms as needed.  Complete a follow-up call 2 to 3 weeks after medication self-management plan developed; assess adherence and understanding, as well as listen to patient or caregiver concerns; amend plan as needed.  Provide frequent follow-up providing motivation, encouragement and support when medication nonadherence is identified.   Notes:   1/31 - Case closed    Task: Optimize Medication Use Completed 12/12/2020  Due Date: 01/16/2021  Note:   Care Management Activities:    - barriers to medication adherence identified - medication list compiled - medication list reviewed - medication-adherence assessment completed - strategies for improving adherence encouraged - understanding of current medications assessed    Notes:  [place] referral placed for medication assistance with Trelegy    Patient Care Plan: COPD (Adult)     Problem Identified: Symptom Exacerbation (COPD)   Priority: Medium     Long-Range Goal: Symptom Exacerbation Prevented or Minimized   Start Date: 12/05/2020  Expected End Date: 02/03/2021  This Visit's Progress: On track  Priority: High  Note:   Evidence-based guidance:  Monitor for signs of respiratory infection, including changes in sputum color, volume and thickness, as well as fever.  Encourage infection prevention strategies that may include prophylactic antibiotic therapy for patients with history of frequent exacerbations or antibiotic administration during exacerbation based on presentation, risk and benefit.  Encourage receipt of influenza and pneumococcal vaccine.  Prepare patient for use of home long-term oxygen therapy in presence of sever resting hypoxemia.  Prepare patients for laboratory studies or diagnostic exams, such as spirometry, pulse oximetry and arterial blood gas based on current  symptoms, risk factors and presentation.  Assess barriers and manage adherence, including inhaler technique and persistent trigger exposure; encourage adherence, even when symptoms are controlled or infrequent.  Assess and monitor for signs/symptoms of psychosocial concerns, such as shortness of breath-anxiety cycle or depression that may impact stability of symptoms.  Identify economic resources, sociocultural beliefs, social factors and health literacy that may interfere with adherence.  Promote lifestyle changes when needed, including regular physical activity based on tolerance, weight loss, healthy eating and stress management.  Consider referral to nurse or community health worker or home-visiting program for intensive support and education (disease-management program).  Increase frequency of follow-up following exacerbation or hospitalization; consider transition of care interventions, such as hospital visit, home visit, telephone follow-up, review of discharge summary and resource referrals.   Notes:     Task: Identify and Minimize Risk of COPD Exacerbation   Due Date: 05/11/2021  Note:   Care Management Activities:    - barriers to lifestyle changes reviewed and addressed - barriers to treatment reviewed and addressed - healthy lifestyle promoted - rescue (action) plan reviewed - signs/symptoms of infection reviewed - signs/symptoms of worsening disease assessed - treatment plan reviewed    Notes:     Patient Care Plan: CCM Care Plan     Problem Identified: HTN, HF, HLD, CKD, COPD, Chronic Pain, Osteopenia   Priority: High  Onset Date: 06/26/2021     Long-Range Goal: Disease Management   Start Date: 06/26/2021  Expected End Date: 12/27/2021  This Visit's Progress: On track  Priority: High  Note:   Current Barriers:  Unable to independently monitor therapeutic efficacy Unable to achieve control of blood pressure   Pharmacist Clinical Goal(s):  Patient will achieve  adherence to monitoring guidelines and medication adherence to achieve therapeutic efficacy achieve control of blood pressure as evidenced by blood pressure logs maintain control of LDL, Pain, CKD, and COPD as evidenced by next lipid panel, next GFR, pain levels, and frequency of rescue inhaler use  through collaboration with PharmD and provider.   Interventions: 1:1 collaboration with Horald Pollen, MD regarding development and update of comprehensive plan of care as evidenced by provider attestation and co-signature Inter-disciplinary care team collaboration (see longitudinal plan of care) Comprehensive medication review performed; medication list updated in electronic medical record  Hyperlipidemia: (LDL goal < 100) -Controlled Lab Results  Component Value Date   [place] 68 05/30/2020  -Current treatment: Atorvastatin 33m - 1 tablet daily  Aspirin 815m- 1 tablet daily  Fish Oils 100064m 1 capsule daily  -Medications previously tried: n/a  -Current dietary patterns: reports to eating a diet that is low in high cholesterol foods -Current exercise habits: reports to daily yoga / banded workouts -Educated on Cholesterol goals;  Benefits of statin for ASCVD risk reduction; Importance of limiting foods high in cholesterol; -Counseled on diet and exercise extensively Recommended to continue current medication  Heart Failure (Goal: manage symptoms and prevent exacerbations)/  Hypertension (BP goal <140/90) -Not ideally controlled -Last ejection fraction: 60-65% (Date: 12/2015) -HF type: Diastolic -NYHA Class: II-IIb -Current treatment: Carvedilol 12.23m - 1 tablet twice daily  Losartan 1049m- 1 tablet daily -Medications previously tried: amlodipine (edema), benazepril (cough), hctz (sweating), metoprolol succinate, valsartan   -Current home BP/HR readings: reports that blood pressures have been elevated, averaging ~146/78-83 - reports that HR ~71-72 -Current dietary habits:  reports to following a low sodium diet -Current exercise habits: yoga exercises / band workouts  - working out daily  -Educated on Benefits of medications for managing symptoms and prolonging life Importance of weighing daily; if you gain more than 3 pounds in one day or 5 pounds in one week, reach out to office Importance of blood pressure control -Recommended for patient to split losartan dosing - 5066mwice daily and continue to monitor blood pressure at least once weekly  COPD (Goal: control symptoms and prevent exacerbations) -Controlled -Current treatment  Trelegy ellipta 100-62.5-25mcg/act - 1 puff daily  Albuterol 0.083% nebulizer solution - inhale 1 vial every 6 hours as needed  Albuterol 108m73mct - 1-2 puffs every 6 hours as needed  Guaifenesin 1200mg63m tablet twice daily  -Medications previously tried: Advair, dulera, duoneb, spiriva  -Gold Grade: Gold 2 (FEV1 50-79%) -Current COPD Classification:  B-C -Exacerbations requiring treatment in last 6 months: none  -Patient reports consistent use of maintenance inhaler -Frequency of rescue inhaler use: on average ~once a month -Counseled on Proper inhaler technique; Benefits of consistent maintenance inhaler use When to use rescue inhaler Differences between maintenance and rescue inhalers -Recommended to continue current medication -Advised for patient to reach out if she has any issues or questions about possibly inhaler replacements after pulmonology follow up next month  Osteopenia (Goal Prevention of facture) -Not ideally controlled -Last DEXA Scan: 05/16/2018   T-Score left femoral neck: -1.1  T-Score total left femur: -1.6  T-Score right femoral neck: -1.6  T-Score total right femur: -1.8  T-Score lumbar spine: -0.4  10-year probability of major osteoporotic fracture: 16.6  10-year probability of hip fracture: 5.0% -Patient is a candidate for pharmacologic treatment due to T-Score -1.0 to -2.5 and 10-year risk of  hip fracture > 3% -Current treatment  Vitamin D3 1000 units - 1 tablet daily  -Medications previously tried: alendronate  - stopped after 2 months due to joint pain  -Recommend [phone] units of vitamin D daily. Recommend 1200 mg of calcium daily from dietary and supplemental sources.  -discussed with patient about risk of possible hip fracture and recommendation to trial ibandronate which she declined at this time, was not interested in prolia injections either -Recommended to continue current medication  Allergic Rhinitis (Goal: prevention/control of allergies) -Controlled -Current treatment  Loratadine 10mg 51mtablet daily  -Medications previously tried: fluticasone, azelastine -Recommended to continue current medication  Chronic Pain - Low Back (Goal: Pain control) -Controlled -Current treatment  Acetaminophen 500mg -27mablet every 6 hours as needed  Tramadol 50mg - 68mblet every 6 hours as needed (taking 1-2 tablets a month)  -Medications previously tried: oxycodone, ibuprofen, lidocaine patches  -Recommended to continue current medication   Chronic Kidney Disease (Goal: Prevention of disease progression) -Controlled - stable -Last eGFR - 59 mL/min - 06/05/2021 -Last CrCl - 63.65 mL/min  - Advised for patient to avoid NSAID use, and encouraged for BP monitoring to ensure goal BP to prevent worsening kidney function   Health Maintenance -Vaccine gaps: Shingles vaccine, influenza vaccine -Current therapy:  Guaifenesin 1235m - 1 tablet twice daily  Sodium Chloride 0.65% nasal spray - 1 spray into both nostrils daily  Artificial Tears - 1 drop into both eyes daily as needed  Vitamin C 10011m- 1 tablet daily  Calcium Carbonate 75061m 1 tablet daily as needed  -Educated on Cost vs benefit of each product must be carefully weighed by individual consumer -Patient is satisfied with current therapy and denies issues -Recommended to continue current medication  Patient  Goals/Self-Care Activities Patient will:  - take medications as prescribed check blood pressure 1-2 times daily, document, and provide at future appointments  Follow Up Plan: Telephone follow up appointment with care management team member scheduled for: 4-6 weeks The patient has been provided with contact information for the care management team and has been advised to call with any health related questions or concerns.

## 2021-06-26 NOTE — Progress Notes (Signed)
Chronic Care Management Pharmacy Note  06/26/2021 Name:  TACARA HADLOCK MRN:  [id number] DOB:  01-01-1941  Summary: -Patient reports that blood pressure has been slightly elevated recently, reports that at home it is typically averaging ~143/78-82 with a heart rate in the 70's, reports to mild edema in legs - more noticeable on days that she is more active -Notes that COPD has been well controlled, infrequent use of rescue inhaler, is to see pulmonology within the next month where the plan is to step down maintenance inhaler form trelegy to Stiolto, Anoro, or Bevespi -Reports that pain levels have been controlled on tramadol and acetaminophen use - using each medication infrequently -LDL well controlled and at goal, patient denies any issues with medications  Recommendations/Changes made from today's visit: -Recommending for patient to start wearing compression socks to help reduce swelling in legs -Patient to split losartan dosing to help better control blood pressure (did not wish to trial increase in coreg dosing at this time).  Patient will continue to monitor blood pressure 1-2 times daily as she has been doing  Subjective: FAVOR KREH is an 80 y.o. year old female who is a primary patient of [name], Ines Bloomer, MD.  The CCM team was consulted for assistance with disease management and care coordination needs.    Engaged with patient by telephone for initial visit in response to provider referral for pharmacy case management and/or care coordination services.   Consent to Services:  The patient was given the following information about Chronic Care Management services today, agreed to services, and gave verbal consent: 1. CCM service includes personalized support from designated clinical staff supervised by the primary care provider, including individualized plan of care and coordination with other care providers 2. 24/7 contact phone numbers for assistance for urgent and routine  care needs. 3. Service will only be billed when office clinical staff spend 20 minutes or more in a month to coordinate care. 4. Only one practitioner may furnish and bill the service in a calendar month. 5.The patient may stop CCM services at any time (effective at the end of the month) by phone call to the office staff. 6. The patient will be responsible for cost sharing (co-pay) of up to 20% of the service fee (after annual deductible is met). Patient agreed to services and consent obtained.  Patient Care Team: Horald Pollen, MD as PCP - General (Internal Medicine) Curt Bears, MD as Consulting Physician (Oncology) [name], MD (Inactive) as Consulting Physician (Cardiothoracic Surgery) Calvert Cantor, MD as Consulting Physician (Ophthalmology) Delice Bison Darnelle Maffucci, [facility] as Pharmacist (Pharmacist)  Recent office visits:  02/21/21-Miguel Joellen Jersey, MD (PCP) 3 month follow up. Follow up in 6 months.   Recent consult visits:  06/07/21-Mohamed Julien Nordmann, MD (Oncology) Seen for Non-small cell lung cancer. Recommended for her to continue on observation with repeat CT scan of the chest in 6 months. Follow up in 6 months. 01/04/21-Robert Agustina Caroli, MD (Pulmonology) Follow up visit - continue trelegy for now, possible alternative inhalers will be discussed with follow up appointment (6 months)  Hospital visits:  None in previous 6 months  Objective:  Lab Results  Component Value Date   CREATININE 0.98 06/05/2021   BUN 17 06/05/2021   GFRNONAA 59 (L) 06/05/2021   GFRAA 51 (L) 05/30/2020   NA 140 06/05/2021   K 4.7 06/05/2021   CALCIUM 10.0 06/05/2021   CO2 28 06/05/2021   GLUCOSE 101 (H) 06/05/2021    No  results found for: HGBA1C, FRUCTOSAMINE, GFR, MICROALBUR  Last diabetic Eye exam:  No results found for: HMDIABEYEEXA  Last diabetic Foot exam:  No results found for: HMDIABFOOTEX   Lab Results  Component Value Date   CHOL 169 05/30/2020   HDL 89 05/30/2020    LDLCALC 68 05/30/2020   TRIG 61 05/30/2020   CHOLHDL 1.9 05/30/2020    Hepatic Function Latest Ref Rng & Units 06/05/2021 12/02/2020 11/10/2020  Total Protein 6.5 - 8.1 g/dL 7.0 6.8 7.2  Albumin 3.5 - 5.0 g/dL 3.9 3.9 3.4(L)  AST 15 - 41 U/L 18 21 25   ALT 0 - 44 U/L 14 22 23   Alk Phosphatase 38 - 126 U/L 98 81 88  Total Bilirubin 0.3 - 1.2 mg/dL 0.7 0.7 0.7  Bilirubin, Direct 0.1 - 0.5 mg/dL - - -    Lab Results  Component Value Date/Time   TSH 1.320 11/21/2018 10:58 AM   TSH 1.88 10/02/2016 02:14 PM   FREET4 1.31 12/14/2011 01:43 PM   FREET4 1.13 04/21/2011 12:05 PM    CBC Latest Ref Rng & Units 06/05/2021 12/02/2020 11/14/2020  WBC 4.0 - 10.5 K/uL 7.4 6.7 9.1  Hemoglobin 12.0 - 15.0 g/dL 12.9 12.0 12.3  Hematocrit 36.0 - 46.0 % 39.1 36.5 38.4  Platelets 150 - 400 K/uL 248 192 445(H)    No results found for: VD25OH  Clinical ASCVD: No  The 10-year ASCVD risk score Mikey Bussing DC Jr., et al., 2013) is: 39.7%   Values used to calculate the score:     Age: 29 years     Sex: Female     Is Non-Hispanic African American: No     Diabetic: No     Tobacco smoker: No     Systolic Blood Pressure: 354 mmHg     Is BP treated: Yes     HDL Cholesterol: 89 mg/dL     Total Cholesterol: 169 mg/dL    Depression screen [facility] 2/9 05/18/2021 02/21/2021 11/22/2020  Decreased Interest 1 1 0  Down, Depressed, Hopeless 1 2 0  PHQ - 2 Score 2 3 0  Altered sleeping - 2 -  Tired, decreased energy - 2 -  Change in appetite - 0 -  Feeling bad or failure about yourself  - 0 -  Trouble concentrating - 0 -  Moving slowly or fidgety/restless - 0 -  Suicidal thoughts - 0 -  PHQ-9 Score - 7 -  Difficult doing work/chores - - -  Some recent data might be hidden     Social History   Tobacco Use  Smoking Status Former   Packs/day: 1.00   Years: 50.00   Pack years: 50.00   Types: Cigarettes   Quit date: 03/16/2011   Years since quitting: 10.2  Smokeless Tobacco Never  Tobacco Comments   last 4-5 years  of smoking, smoked 0.5 pack/day    BP Readings from Last 3 Encounters:  06/07/21 (!) 163/78  05/18/21 (!) 160/80  02/21/21 140/90   Pulse Readings from Last 3 Encounters:  06/07/21 71  05/18/21 75  02/21/21 73   Wt Readings from Last 3 Encounters:  06/07/21 187 lb 3.2 oz (84.9 kg)  05/18/21 185 lb 12.8 oz (84.3 kg)  02/21/21 185 lb 12.8 oz (84.3 kg)   BMI Readings from Last 3 Encounters:  06/07/21 32.13 kg/m  05/18/21 31.89 kg/m  02/21/21 32.40 kg/m    Assessment/Interventions: Review of patient past medical history, allergies, medications, health status, including review of consultants reports,  laboratory and other test data, was performed as part of comprehensive evaluation and provision of chronic care management services.   SDOH:  (Social Determinants of Health) assessments and interventions performed: Yes  SDOH Screenings   Alcohol Screen: Low Risk    Last Alcohol Screening Score (AUDIT): 0  Depression (PHQ2-9): Low Risk    PHQ-2 Score: 2  Financial Resource Strain: Low Risk    Difficulty of Paying Living Expenses: Not hard at all  Food Insecurity: No Food Insecurity   Worried About Charity fundraiser in the Last Year: Never true   Ran Out of Food in the Last Year: Never true  Housing: Low Risk    Last Housing Risk Score: 0  Physical Activity: Sufficiently Active   Days of Exercise per Week: 5 days   Minutes of Exercise per Session: 30 min  Social Connections: Moderately Integrated   Frequency of Communication with Friends and Family: More than three times a week   Frequency of Social Gatherings with Friends and Family: More than three times a week   Attends Religious Services: More than 4 times per year   Active Member of [employer] or Organizations: No   Attends Music therapist: More than 4 times per year   Marital Status: Divorced  Stress: No Stress Concern Present   Feeling of Stress : Not at all  Tobacco Use: Medium Risk   Smoking Tobacco  Use: Former   Smokeless Tobacco Use: Never  Transportation Needs: No Data processing manager (Medical): No   Lack of Transportation (Non-Medical): No    CCM Care Plan  Allergies  Allergen Reactions   Bee Venom Anaphylaxis, Shortness Of Breath, Swelling and Other (See Comments)    Swelling at site    Amlodipine Swelling and Other (See Comments)    Swelling of the ankles and hands    Levofloxacin Other (See Comments)    Joint pain    Alendronate     Joint pains / hypercalcemia    Hctz [Hydrochlorothiazide] Palpitations and Other (See Comments)    Sweating     Medications Reviewed Today     Reviewed by Tomasa Blase, [facility] (Pharmacist) on 06/26/21 at 1654  Med List Status: <None>   Medication Order Taking? Sig Documenting Provider Last Dose Status Informant  acetaminophen (TYLENOL) 500 MG tablet [id number] Yes Take 500 mg by mouth every 6 (six) hours as needed for mild pain, moderate pain, fever or headache. Reported on 05/24/2016 [provider] Taking Active Self  albuterol (PROVENTIL) (2.5 MG/3ML) 0.083% nebulizer solution [id number] Yes Take 3 mLs (2.5 mg total) by nebulization every 6 (six) hours as needed for wheezing or shortness of breath. Horald Pollen, MD Taking Active   albuterol (VENTOLIN HFA) 108 [phone] Base) MCG/ACT inhaler [id number] Yes Inhale 1-2 puffs into the lungs every 6 (six) hours as needed for wheezing or shortness of breath. Forrest Moron, MD Taking Active Self  Ascorbic Acid (VITAMIN C) 1000 MG tablet [id number] Yes Take 1,000 mg by mouth daily. [provider] Taking Active Self  aspirin EC 81 MG tablet [id number] Yes Take 81 mg by mouth daily. [provider] Taking Active Self           Med Note Tedra Senegal D   Wed Jan 07, 2019  1:56 PM)    atorvastatin (LIPITOR) 40 MG tablet [id number] Yes Take 1 tablet (40 mg total) by mouth daily. Horald Pollen, MD Taking Active  calcium carbonate (TUMS EX)  750 MG chewable tablet [id number] Yes Chew 1 tablet by mouth daily as needed for heartburn. [provider] Taking Active Self  Carboxymethylcellulose Sodium (ARTIFICIAL TEARS OP) [id number] Yes Place 1 drop into both eyes daily as needed (dry eyes). [provider] Taking Active Self  carvedilol (COREG) 12.5 MG tablet [id number] Yes Take 12.5 mg by mouth 2 (two) times daily. [provider] Taking Active Self  Cholecalciferol (VITAMIN D3 PO) [id number] Yes Take 1,000 Units by mouth daily. [provider] Taking Active Self  Guaifenesin [facility] MAXIMUM STRENGTH) 1200 MG TB12 [id number] Yes Take 1,200 mg by mouth 2 (two) times daily. [provider] Taking Active Self  losartan (COZAAR) 100 MG tablet [id number] Yes TAKE 1 TABLET BY MOUTH EVERY DAY  Patient taking differently: Take 50 mg by mouth 2 (two) times daily.   Horald Pollen, MD Taking Active   OMEGA-3 FATTY ACIDS PO [id number] Yes Take 1,200 mg by mouth daily. [provider] Taking Active Self  OXYGEN [id number] Yes Inhale 2 L into the lungs continuous. [provider] Taking Active Self           Med Note Blanch Media, CYNTHIA A   Tue Feb 21, 2021 10:14 AM) use  sodium chloride (OCEAN) 0.65 % SOLN nasal spray [id number] Yes Place 1 spray into both nostrils daily. [provider] Taking Active Self  traMADol (ULTRAM) 50 MG tablet [id number] Yes Take 1 tablet (50 mg total) by mouth every 6 (six) hours as needed (BACK PAIN). 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   Chronic diastolic CHF (congestive heart failure) (HCC)    CKD (chronic kidney disease) stage 3, GFR 30-59 ml/min ([place]) 11/10/2020   Allergic rhinitis 01/06/2019   Chronic respiratory failure with hypoxia ([place]) 12/26/2018   Age-related osteoporosis with current pathological fracture 11/21/2018   Renal insufficiency 05/20/2018   History of compression fracture of spine 12/10/2016   Hypercalcemia 06/25/2016   Insomnia 05/16/2016   Pneumonitis, radiation ([place]) 04/11/2016   S/P lobectomy of lung 01/18/2016   Chemotherapy-induced neuropathy ([place]) 11/01/2015   Non-small cell carcinoma of right lung, stage 2 ([place]) 10/03/2015   Hyperlipidemia 08/14/2013   Spondylolisthesis of lumbar region 07/03/2013   Essential hypertension 12/17/2011   COPD GOLD B-C 12/17/2011   Renal cell cancer ([place]) 12/17/2011   Sciatica of left side 12/17/2011   Depression 12/17/2011    Immunization History  Administered Date(s) Administered   Fluad Quad(high Dose 65+) 07/18/2019   Hepatitis B 11/12/1998   Influenza Split 09/12/2012   Influenza, High Dose Seasonal PF 08/23/2017, 08/23/2018   Influenza,inj,Quad PF,6+ Mos 07/18/2013   Influenza-Unspecified 07/29/2014, 07/23/2015, 08/25/2016, 07/28/2020   PFIZER(Purple Top)SARS-COV-2 Vaccination 12/24/2019, 01/18/2020, 05/07/2020, 03/23/2021   Pneumococcal Conjugate-13 07/29/2015   Pneumococcal Polysaccharide-23 11/12/2000, 10/23/2012, 08/23/2017   Pneumococcal-Unspecified 10/23/2012, 08/23/2017   Tdap 12/02/2013   Zoster, Live 12/13/2010    Conditions to be addressed/monitored:  Hypertension, Hyperlipidemia, Heart Failure, COPD, Chronic Kidney Disease, Osteopenia, Allergic Rhinitis, and Chronic Pain  Care Plan : CCM Care Plan  Updates made by Tomasa Blase, RPH since 06/26/2021 12:00 AM     Problem: HTN, HF, HLD, CKD, COPD, Chronic Pain, Osteopenia   Priority: High  Onset Date: 06/26/2021     Long-Range Goal:  Disease Management   Start Date: 06/26/2021  Expected End Date: 12/27/2021  This Visit's Progress: On track  Priority: High  Note:   Current Barriers:  Unable to independently monitor therapeutic efficacy Unable to achieve control of blood pressure   Pharmacist Clinical Goal(s):  Patient will achieve adherence to monitoring guidelines and medication adherence to achieve therapeutic efficacy achieve control of blood pressure as evidenced by blood pressure logs maintain control of LDL, Pain, CKD, and COPD as evidenced by next lipid panel, next GFR, pain levels, and frequency of rescue inhaler use  through collaboration with PharmD and provider.   Interventions: 1:1 collaboration with Horald Pollen, MD regarding development and update of comprehensive plan of care as evidenced by provider attestation and co-signature Inter-disciplinary care team collaboration (see longitudinal plan of care) Comprehensive medication review performed; medication list updated in electronic medical record  Hyperlipidemia: (LDL goal < 100) -Controlled Lab Results  Component Value Date   [place] 68 05/30/2020  -Current treatment: Atorvastatin 40mg  - 1 tablet daily  Aspirin 81mg  - 1 tablet daily  Fish Oils 1000mg  - 1 capsule daily  -Medications previously tried: n/a  -Current dietary patterns: reports to eating a diet that is low in high cholesterol foods -Current exercise habits: reports to daily yoga / banded workouts -Educated on Cholesterol goals;  Benefits of statin for ASCVD risk reduction; Importance of limiting foods high in cholesterol; -Counseled on diet and exercise extensively Recommended to continue current medication  Heart Failure (Goal: manage symptoms and prevent exacerbations)/ Hypertension (BP goal <140/90) -Not ideally controlled -Last ejection fraction: 60-65% (Date: 12/2015) -HF type: Diastolic -NYHA Class: II-IIb -Current treatment: Carvedilol 12.5mg  - 1 tablet twice  daily  Losartan 100mg  - 1 tablet daily -Medications previously tried: amlodipine (edema), benazepril (cough), hctz (sweating), metoprolol succinate, valsartan   -Current home BP/HR readings: reports that blood pressures have been elevated, averaging ~146/78-83 - reports that HR ~71-72 -Current dietary habits: reports to following a low sodium diet -Current exercise habits: yoga exercises / band workouts  - working out daily  -Educated on Benefits of medications for managing symptoms and prolonging life Importance of weighing daily; if you gain more than 3 pounds in one day or 5 pounds in one week, reach out to office Importance of blood pressure control -Recommended for patient to split losartan dosing - 50mg  twice daily and continue to monitor blood pressure at least once weekly  COPD (Goal: control symptoms and prevent exacerbations) -Controlled -Current treatment  Trelegy ellipta 100-62.5-25mcg/act - 1 puff daily  Albuterol 0.083% nebulizer solution - inhale 1 vial every 6 hours as needed  Albuterol 146mcg/act - 1-2 puffs every 6 hours as needed  Guaifenesin 1200mg  - 1 tablet twice daily  -Medications previously tried: Advair, dulera, duoneb, spiriva  -Gold Grade: Gold 2 (FEV1 50-79%) -Current COPD Classification:  B-C -Exacerbations requiring treatment in last 6 months: none  -Patient reports consistent use of maintenance inhaler -Frequency of rescue inhaler use: on average ~once a month -Counseled on Proper inhaler technique; Benefits of consistent maintenance inhaler use When to use rescue inhaler Differences between maintenance and rescue inhalers -Recommended to continue current medication -Advised for patient to reach out if she has any issues or questions about possibly  inhaler replacements after pulmonology follow up next month  Osteopenia (Goal Prevention of facture) -Not ideally controlled -Last DEXA Scan: 05/16/2018   T-Score left femoral neck: -1.1  T-Score total left  femur: -1.6  T-Score right femoral neck: -1.6  T-Score total right femur: -1.8  T-Score lumbar spine: -0.4  10-year probability of major osteoporotic fracture: 16.6  10-year probability of hip fracture: 5.0% -Patient is a candidate for pharmacologic treatment due to T-Score -1.0 to -2.5 and 10-year risk of hip fracture > 3% -Current treatment  Vitamin D3 1000 units - 1 tablet daily  -Medications previously tried: alendronate  - stopped after 2 months due to joint pain  -Recommend [phone] units of vitamin D daily. Recommend 1200 mg of calcium daily from dietary and supplemental sources.  -discussed with patient about risk of possible hip fracture and recommendation to trial ibandronate which she declined at this time, was not interested in prolia injections either -Recommended to continue current medication  Allergic Rhinitis (Goal: prevention/control of allergies) -Controlled -Current treatment  Loratadine 56m - 1 tablet daily  -Medications previously tried: fluticasone, azelastine -Recommended to continue current medication  Chronic Pain - Low Back (Goal: Pain control) -Controlled -Current treatment  Acetaminophen 5069m- 1 tablet every 6 hours as needed  Tramadol 5065m 1 tablet every 6 hours as needed (taking 1-2 tablets a month)  -Medications previously tried: oxycodone, ibuprofen, lidocaine patches  -Recommended to continue current medication   Chronic Kidney Disease (Goal: Prevention of disease progression) -Controlled - stable -Last eGFR - 59 mL/min - 06/05/2021 -Last CrCl - 63.65 mL/min  - Advised for patient to avoid NSAID use, and encouraged for BP monitoring to ensure goal BP to prevent worsening kidney function   Health Maintenance -Vaccine gaps: Shingles vaccine, influenza vaccine -Current therapy:  Guaifenesin 1200m10m1 tablet twice daily  Sodium Chloride 0.65% nasal spray - 1 spray into both nostrils daily  Artificial Tears - 1 drop into both eyes daily as  needed  Vitamin C 1000mg31m tablet daily  Calcium Carbonate 750mg 1mtablet daily as needed  -Educated on Cost vs benefit of each product must be carefully weighed by individual consumer -Patient is satisfied with current therapy and denies issues -Recommended to continue current medication  Patient Goals/Self-Care Activities Patient will:  - take medications as prescribed check blood pressure 1-2 times daily, document, and provide at future appointments  Follow Up Plan: Telephone follow up appointment with care management team member scheduled for: 4-6 weeks The patient has been provided with contact information for the care management team and has been advised to call with any health related questions or concerns.          Medication Assistance: Trelegy obtained through [employer] [place] assistance program.   Patient's preferred pharmacy is:  [id number], [place] - [id number] [phone] pill box? Yes Pt endorses 100% compliance  Care Plan and Follow Up Patient Decision:  Patient agrees to Care Plan and Follow-up.  Plan: Telephone follow up appointment with care management team member scheduled for:  4-6 weeks and The patient has been provided with contact information for the care management team and has been advised to call with any health related questions or concerns.   Abubakar Crispo Tomasa BlaseD Clinical Pharmacist, [place]/22 4:54 PM

## 2021-08-06 NOTE — Progress Notes (Signed)
This encounter was created in error - please disregard.

## 2021-08-07 ENCOUNTER — Telehealth: Payer: Self-pay

## 2021-08-07 NOTE — Progress Notes (Signed)
Chronic Care Management Pharmacy Assistant   Name: Tammy Ball  MRN: [id number] DOB: [birth year]   Reason for Encounter: Disease State Hypertension    Recent office visits:  None noted   Recent consult visits:  None noted   Hospital visits:  None in previous 6 months  Medications: Outpatient Encounter Medications as of 08/07/2021  Medication Sig Note   acetaminophen (TYLENOL) 500 MG tablet Take 500 mg by mouth every 6 (six) hours as needed for mild pain, moderate pain, fever or headache. Reported on 05/24/2016    albuterol (PROVENTIL) (2.5 MG/3ML) 0.083% nebulizer solution Take 3 mLs (2.5 mg total) by nebulization every 6 (six) hours as needed for wheezing or shortness of breath.    albuterol (VENTOLIN HFA) 108 (90 Base) MCG/ACT inhaler Inhale 1-2 puffs into the lungs every 6 (six) hours as needed for wheezing or shortness of breath.    Ascorbic Acid (VITAMIN C) 1000 MG tablet Take 1,000 mg by mouth daily.    aspirin EC 81 MG tablet Take 81 mg by mouth daily.    atorvastatin (LIPITOR) 40 MG tablet Take 1 tablet (40 mg total) by mouth daily.    calcium carbonate (TUMS EX) 750 MG chewable tablet Chew 1 tablet by mouth daily as needed for heartburn.    Carboxymethylcellulose Sodium (ARTIFICIAL TEARS OP) Place 1 drop into both eyes daily as needed (dry eyes).    carvedilol (COREG) 12.5 MG tablet Take 12.5 mg by mouth 2 (two) times daily.    Cholecalciferol (VITAMIN D3 PO) Take 1,000 Units by mouth daily.    Guaifenesin (MUCINEX MAXIMUM STRENGTH) 1200 MG TB12 Take 1,200 mg by mouth 2 (two) times daily.    losartan (COZAAR) 100 MG tablet TAKE 1 TABLET BY MOUTH EVERY DAY (Patient taking differently: Take 50 mg by mouth 2 (two) times daily.)    OMEGA-3 FATTY ACIDS PO Take 1,200 mg by mouth daily.    OXYGEN Inhale 2 L into the lungs continuous. 02/21/2021: use   sodium chloride (OCEAN) 0.65 % SOLN nasal spray Place 1 spray into both nostrils daily.    traMADol (ULTRAM) 50 MG tablet Take  1 tablet (50 mg total) by mouth every 6 (six) hours as needed (BACK PAIN).    TRELEGY ELLIPTA 100-62.5-25 MCG/INH AEPB INHALE 1 PUFF BY MOUTH EVERY DAY    No facility-administered encounter medications on file as of 08/07/2021.    Care Gaps: Hepatitis C Screening Never done  Zoster Vaccines- Shingrix Never done  INFLUENZA VACCINE Last completed: Aug 07, 2020 COVID-19 Vaccine Last completed: Mar 23, 2021  Reviewed chart prior to disease state call. Spoke with patient regarding BP  Recent Office Vitals: BP Readings from Last 3 Encounters:  06/07/21 (!) 163/78  05/18/21 (!) 160/80  02/21/21 140/90   Pulse Readings from Last 3 Encounters:  06/07/21 71  05/18/21 75  02/21/21 73    Wt Readings from Last 3 Encounters:  06/07/21 187 lb 3.2 oz (84.9 kg)  05/18/21 185 lb 12.8 oz (84.3 kg)  02/21/21 185 lb 12.8 oz (84.3 kg)     Kidney Function Lab Results  Component Value Date/Time   CREATININE 0.98 06/05/2021 10:51 AM   CREATININE 1.13 (H) 12/02/2020 09:15 AM   CREATININE 1.3 (H) 11/11/2017 09:39 AM   CREATININE 1.1 05/06/2017 08:32 AM   GFRNONAA 59 (L) 06/05/2021 10:51 AM   GFRNONAA 52 (L) 12/02/2013 09:11 AM   GFRAA 51 (L) 05/30/2020 11:03 AM   GFRAA 60 12/02/2013 09:11 AM  BMP Latest Ref Rng & Units 06/05/2021 12/02/2020 11/14/2020  Glucose 70 - 99 mg/dL 101(H) 101(H) 124(H)  BUN 8 - 23 mg/dL 17 21 41(H)  Creatinine 0.44 - 1.00 mg/dL 0.98 1.13(H) 1.12(H)  BUN/Creat Ratio 12 - 28 - - -  Sodium 135 - 145 mmol/L 140 137 139  Potassium 3.5 - 5.1 mmol/L 4.7 5.3(H) 5.0  Chloride 98 - 111 mmol/L 104 100 96(L)  CO2 22 - 32 mmol/L 28 30 32  Calcium 8.9 - 10.3 mg/dL 10.0 9.7 9.9    Current antihypertensive regimen:  Losartan 100mg  - 1 tablet daily, Patient taking 1/2 tablet in morning and 1/2 tablet in the evening.  How often are you checking your Blood Pressure? daily around 7-7:30am Current home BP readings: 07/28/21 @131 /68 07/31/21 @116 /65 08/07/21 @128 /68 What recent  interventions/DTPs have been made by any provider to improve Blood Pressure control since last CPP Visit: Patient states that she was taking 1 losartan pill a day and since changing it to 1/2 a pill morning and 1/2 in the evening, she states that her blood pressure is doing better.  Any recent hospitalizations or ED visits since last visit with CPP? No What diet changes have been made to improve Blood Pressure Control?  Patient states that she does not use salt in her food anymore. She uses Dash seasoning, and Rinses out cans with water to reduce sodium intake.  What exercise is being done to improve your Blood Pressure Control?  Patient states that she tries to walk as much as she can but her back pain prevents her from walking as much as she would like to.   Misc comment: Ms Collie Siad is a very pleasant woman. She states that she has been doing well on her blood pressure since changing the way she takes her Losartan medication. She does state that she gets indigestion and assumes it is because her losartan is a green pill now, She states it used to be a white pill. When questioned about exercise, Ms. Collie Siad stated that while out shopping with a cart, it makes her back pain more bearable. She stated that she was able to walk around the store for an hour when she has a cart.   Adherence Review: Is the patient currently on ACE/ARB medication? Yes Does the patient have >5 day gap between last estimated fill dates? No    Star Rating Drugs: Losartan last fill 07/24/21 90 DS  Atorvastatin 40 mg last fill 08/06/21 90 DS     Andee Poles, CMA

## 2021-08-16 ENCOUNTER — Telehealth: Payer: Self-pay

## 2021-08-16 NOTE — Chronic Care Management (AMB) (Unsigned)
Chronic Care Management Pharmacy Assistant   Name: NOELIE RENFROW  MRN: [id number] DOB: 04-26-1941  PHEOBE Ball is an 80 y.o. year old female who presents for her follow-up CCM visit with the clinical pharmacist.  Reason for Encounter: Disease State HTN   Recent office visits:  N/A  Recent consult visits:  [facility] visits:  None in previous 6 months  Medications: Outpatient Encounter Medications as of 08/16/2021  Medication Sig Note   acetaminophen (TYLENOL) 500 MG tablet Take 500 mg by mouth every 6 (six) hours as needed for mild pain, moderate pain, fever or headache. Reported on 05/24/2016    albuterol (PROVENTIL) (2.5 MG/3ML) 0.083% nebulizer solution Take 3 mLs (2.5 mg total) by nebulization every 6 (six) hours as needed for wheezing or shortness of breath.    albuterol (VENTOLIN HFA) 108 (90 Base) MCG/ACT inhaler Inhale 1-2 puffs into the lungs every 6 (six) hours as needed for wheezing or shortness of breath.    Ascorbic Acid (VITAMIN C) 1000 MG tablet Take 1,000 mg by mouth daily.    aspirin EC 81 MG tablet Take 81 mg by mouth daily.    atorvastatin (LIPITOR) 40 MG tablet Take 1 tablet (40 mg total) by mouth daily.    calcium carbonate (TUMS EX) 750 MG chewable tablet Chew 1 tablet by mouth daily as needed for heartburn.    Carboxymethylcellulose Sodium (ARTIFICIAL TEARS OP) Place 1 drop into both eyes daily as needed (dry eyes).    carvedilol (COREG) 12.5 MG tablet Take 12.5 mg by mouth 2 (two) times daily.    Cholecalciferol (VITAMIN D3 PO) Take 1,000 Units by mouth daily.    Guaifenesin (MUCINEX MAXIMUM STRENGTH) 1200 MG TB12 Take 1,200 mg by mouth 2 (two) times daily.    losartan (COZAAR) 100 MG tablet TAKE 1 TABLET BY MOUTH EVERY DAY (Patient taking differently: Take 50 mg by mouth 2 (two) times daily.)    OMEGA-3 FATTY ACIDS PO Take 1,200 mg by mouth daily.    OXYGEN Inhale 2 L into the lungs continuous. 02/21/2021: use   sodium chloride (OCEAN) 0.65 % SOLN  nasal spray Place 1 spray into both nostrils daily.    traMADol (ULTRAM) 50 MG tablet Take 1 tablet (50 mg total) by mouth every 6 (six) hours as needed (BACK PAIN).    TRELEGY ELLIPTA 100-62.5-25 MCG/INH AEPB INHALE 1 PUFF BY MOUTH EVERY DAY    No facility-administered encounter medications on file as of 08/16/2021.   Subjective:  Tammy Ball is a 80 y.o. female with hypertension. Current Outpatient Medications  Medication Sig Dispense Refill   acetaminophen (TYLENOL) 500 MG tablet Take 500 mg by mouth every 6 (six) hours as needed for mild pain, moderate pain, fever or headache. Reported on 05/24/2016     albuterol (PROVENTIL) (2.5 MG/3ML) 0.083% nebulizer solution Take 3 mLs (2.5 mg total) by nebulization every 6 (six) hours as needed for wheezing or shortness of breath. 75 mL 6   albuterol (VENTOLIN HFA) 108 (90 Base) MCG/ACT inhaler Inhale 1-2 puffs into the lungs every 6 (six) hours as needed for wheezing or shortness of breath. 1 Inhaler 6   Ascorbic Acid (VITAMIN C) 1000 MG tablet Take 1,000 mg by mouth daily.     aspirin EC 81 MG tablet Take 81 mg by mouth daily.     atorvastatin (LIPITOR) 40 MG tablet Take 1 tablet (40 mg total) by mouth daily. 90 tablet 3   calcium carbonate (TUMS EX)  750 MG chewable tablet Chew 1 tablet by mouth daily as needed for heartburn.     Carboxymethylcellulose Sodium (ARTIFICIAL TEARS OP) Place 1 drop into both eyes daily as needed (dry eyes).     carvedilol (COREG) 12.5 MG tablet Take 12.5 mg by mouth 2 (two) times daily.     Cholecalciferol (VITAMIN D3 PO) Take 1,000 Units by mouth daily.     Guaifenesin (MUCINEX MAXIMUM STRENGTH) 1200 MG TB12 Take 1,200 mg by mouth 2 (two) times daily.     losartan (COZAAR) 100 MG tablet TAKE 1 TABLET BY MOUTH EVERY DAY (Patient taking differently: Take 50 mg by mouth 2 (two) times daily.) 90 tablet 1   OMEGA-3 FATTY ACIDS PO Take 1,200 mg by mouth daily.     OXYGEN Inhale 2 L into the lungs continuous.     sodium  chloride (OCEAN) 0.65 % SOLN nasal spray Place 1 spray into both nostrils daily.     traMADol (ULTRAM) 50 MG tablet Take 1 tablet (50 mg total) by mouth every 6 (six) hours as needed (BACK PAIN). 90 tablet 0   TRELEGY ELLIPTA 100-62.5-25 MCG/INH AEPB INHALE 1 PUFF BY MOUTH EVERY DAY 180 each 3   No current facility-administered medications for this visit.    Subjective:  Tammy Ball is a 80 y.o. female with hypertension. Current Outpatient Medications  Medication Sig Dispense Refill   acetaminophen (TYLENOL) 500 MG tablet Take 500 mg by mouth every 6 (six) hours as needed for mild pain, moderate pain, fever or headache. Reported on 05/24/2016     albuterol (PROVENTIL) (2.5 MG/3ML) 0.083% nebulizer solution Take 3 mLs (2.5 mg total) by nebulization every 6 (six) hours as needed for wheezing or shortness of breath. 75 mL 6   albuterol (VENTOLIN HFA) 108 (90 Base) MCG/ACT inhaler Inhale 1-2 puffs into the lungs every 6 (six) hours as needed for wheezing or shortness of breath. 1 Inhaler 6   Ascorbic Acid (VITAMIN C) 1000 MG tablet Take 1,000 mg by mouth daily.     aspirin EC 81 MG tablet Take 81 mg by mouth daily.     atorvastatin (LIPITOR) 40 MG tablet Take 1 tablet (40 mg total) by mouth daily. 90 tablet 3   calcium carbonate (TUMS EX) 750 MG chewable tablet Chew 1 tablet by mouth daily as needed for heartburn.     Carboxymethylcellulose Sodium (ARTIFICIAL TEARS OP) Place 1 drop into both eyes daily as needed (dry eyes).     carvedilol (COREG) 12.5 MG tablet Take 12.5 mg by mouth 2 (two) times daily.     Cholecalciferol (VITAMIN D3 PO) Take 1,000 Units by mouth daily.     Guaifenesin (MUCINEX MAXIMUM STRENGTH) 1200 MG TB12 Take 1,200 mg by mouth 2 (two) times daily.     losartan (COZAAR) 100 MG tablet TAKE 1 TABLET BY MOUTH EVERY DAY (Patient taking differently: Take 50 mg by mouth 2 (two) times daily.) 90 tablet 1   OMEGA-3 FATTY ACIDS PO Take 1,200 mg by mouth daily.     OXYGEN Inhale 2 L  into the lungs continuous.     sodium chloride (OCEAN) 0.65 % SOLN nasal spray Place 1 spray into both nostrils daily.     traMADol (ULTRAM) 50 MG tablet Take 1 tablet (50 mg total) by mouth every 6 (six) hours as needed (BACK PAIN). 90 tablet 0   TRELEGY ELLIPTA 100-62.5-25 MCG/INH AEPB INHALE 1 PUFF BY MOUTH EVERY DAY 180 each 3   No current facility-administered  medications for this visit.    Reviewed chart prior to disease state call. Spoke with patient regarding BP  Recent Office Vitals: BP Readings from Last 3 Encounters:  06/07/21 (!) 163/78  05/18/21 (!) 160/80  02/21/21 140/90   Pulse Readings from Last 3 Encounters:  06/07/21 71  05/18/21 75  02/21/21 73    Wt Readings from Last 3 Encounters:  06/07/21 187 lb 3.2 oz (84.9 kg)  05/18/21 185 lb 12.8 oz (84.3 kg)  02/21/21 185 lb 12.8 oz (84.3 kg)     Kidney Function Lab Results  Component Value Date/Time   CREATININE 0.98 06/05/2021 10:51 AM   CREATININE 1.13 (H) 12/02/2020 09:15 AM   CREATININE 1.3 (H) 11/11/2017 09:39 AM   CREATININE 1.1 05/06/2017 08:32 AM   GFRNONAA 59 (L) 06/05/2021 10:51 AM   GFRNONAA 52 (L) 12/02/2013 09:11 AM   GFRAA 51 (L) 05/30/2020 11:03 AM   GFRAA 60 12/02/2013 09:11 AM    BMP Latest Ref Rng & Units 06/05/2021 12/02/2020 11/14/2020  Glucose 70 - 99 mg/dL 101(H) 101(H) 124(H)  BUN 8 - 23 mg/dL 17 21 41(H)  Creatinine 0.44 - 1.00 mg/dL 0.98 1.13(H) 1.12(H)  BUN/Creat Ratio 12 - 28 - - -  Sodium 135 - 145 mmol/L 140 137 139  Potassium 3.5 - 5.1 mmol/L 4.7 5.3(H) 5.0  Chloride 98 - 111 mmol/L 104 100 96(L)  CO2 22 - 32 mmol/L 28 30 32  Calcium 8.9 - 10.3 mg/dL 10.0 9.7 9.9    Current antihypertensive regimen:  Carvedilol 12.5mg  - 1 tablet twice daily  Losartan 100mg  - 1 tablet daily  How often are you checking your Blood Pressure? twice daily   Current home BP readings: 124/72-130/72   What recent interventions/DTPs have been made by any provider to improve Blood Pressure  control since last CPP Visit: None   Any recent hospitalizations or ED visits since last visit with CPP? No  What diet changes have been made to improve Blood Pressure Control?  Pt stated she cut back on sodium and increased water.  What exercise is being done to improve your Blood Pressure Control?  Pt does modified push ups, leg raises, and walking  Adherence Review: Is the patient currently on ACE/ARB medication? No Does the patient have >5 day gap between last estimated fill dates? No Carvedilol 12.5mg  - 1 tablet twice daily  Losartan 100mg  - 1 tablet daily    Star Rating Drugs: TRELEGY ELLIPTA 100-62.5-25 MCG/INH AEPB last filled 04/14/21 180 DS atorvastatin (LIPITOR) 40 MG tablet last filled 04/13/21 90 DS  [facility] Clinical Pharmacist Assistant [phone]

## 2021-08-21 DIAGNOSIS — Z23 Encounter for immunization: Secondary | ICD-10-CM | POA: Diagnosis not present

## 2021-08-24 ENCOUNTER — Ambulatory Visit: Payer: Medicare Other | Admitting: Emergency Medicine

## 2021-08-29 ENCOUNTER — Ambulatory Visit: Payer: Medicare Other | Admitting: Emergency Medicine

## 2021-09-06 ENCOUNTER — Encounter: Payer: Self-pay | Admitting: Emergency Medicine

## 2021-09-06 ENCOUNTER — Ambulatory Visit (INDEPENDENT_AMBULATORY_CARE_PROVIDER_SITE_OTHER): Payer: Medicare Other | Admitting: Emergency Medicine

## 2021-09-06 ENCOUNTER — Other Ambulatory Visit: Payer: Self-pay

## 2021-09-06 VITALS — BP 130/70 | HR 71 | Ht 64.0 in | Wt 183.0 lb

## 2021-09-06 DIAGNOSIS — M545 Low back pain, unspecified: Secondary | ICD-10-CM | POA: Diagnosis not present

## 2021-09-06 DIAGNOSIS — J449 Chronic obstructive pulmonary disease, unspecified: Secondary | ICD-10-CM

## 2021-09-06 DIAGNOSIS — I10 Essential (primary) hypertension: Secondary | ICD-10-CM

## 2021-09-06 DIAGNOSIS — N1831 Chronic kidney disease, stage 3a: Secondary | ICD-10-CM | POA: Diagnosis not present

## 2021-09-06 DIAGNOSIS — G8929 Other chronic pain: Secondary | ICD-10-CM

## 2021-09-06 DIAGNOSIS — I5032 Chronic diastolic (congestive) heart failure: Secondary | ICD-10-CM | POA: Diagnosis not present

## 2021-09-06 MED ORDER — TRAMADOL HCL 50 MG PO TABS: 50.0000 mg | ORAL_TABLET | Freq: Four times a day (QID) | ORAL | 1 refills | Status: DC | PRN

## 2021-09-06 MED ORDER — CARVEDILOL 12.5 MG PO TABS: 12.5000 mg | ORAL_TABLET | Freq: Two times a day (BID) | ORAL | 3 refills | Status: DC

## 2021-09-06 NOTE — Assessment & Plan Note (Signed)
Well-controlled hypertension. BP Readings from Last 3 Encounters:  09/06/21 130/70  06/07/21 (!) 163/78  05/18/21 (!) 160/80  Continue losartan 100 mg daily and carvedilol 12.5 mg twice a day.

## 2021-09-06 NOTE — Assessment & Plan Note (Signed)
Well-controlled on Trelegy Ellipta. Used albuterol rescue inhaler once for couple days last week for the first time in 6 months.  Off oxygen.  Does breathing exercises regularly.  Feels her pulmonary function is well controlled and stable.

## 2021-09-06 NOTE — Assessment & Plan Note (Signed)
Stable.  Continue atorvastatin 40 mg daily and daily baby aspirin. Takes fish oil 1000 mg daily. Diet and nutrition discussed The 10-year ASCVD risk score (Arnett DK, et al., 2019) is: 27.4%   Values used to calculate the score:     Age: 80 years     Sex: Female     Is Non-Hispanic African American: No     Diabetic: No     Tobacco smoker: No     Systolic Blood Pressure: 412 mmHg     Is BP treated: Yes     HDL Cholesterol: 89 mg/dL     Total Cholesterol: 169 mg/dL

## 2021-09-06 NOTE — Assessment & Plan Note (Addendum)
Osteoporosis treatment in the past caused lots of side effects.  Reluctant to start treatment.  Taking vitamin D and calcium supplements.

## 2021-09-06 NOTE — Assessment & Plan Note (Signed)
Well-controlled with Tylenol.  Takes tramadol only as needed.

## 2021-09-06 NOTE — Assessment & Plan Note (Signed)
Clinically euvolemic.  No acute findings of CHF. Last ejection fraction 60 to 65%

## 2021-09-06 NOTE — Progress Notes (Signed)
Tammy Ball 80 y.o.   Chief Complaint  Patient presents with   Follow-up    6 month chronic condition. Med refill carvedilol. Discuss BP medications.    HISTORY OF PRESENT ILLNESS: This is a 80 y.o. female here for 61-month follow-up on chronic medical problems. #1 hypertension: On carvedilol 12.5 mg twice a day and losartan 100 mg once a day Losartan was given her reflux symptoms.  Better with Tums.  Has been taking 50 mg twice a day with improvement on reflux symptoms. #2 COPD on Trelegy.  Rescue inhaler once in the last 6 months.  Stable. #3 chronic kidney disease #4 osteopenia.  Tried alendronate in the past with bad side effects.  Not interested in osteoporosis treatment.  Takes vitamin D and calcium supplements. #5 chronic back pain.  Tylenol helps.  Occasional tramadol use. #6 dyslipidemia on atorvastatin 40 mg daily and baby aspirin daily.  HPI   Prior to Admission medications   Medication Sig Start Date End Date Taking? Authorizing Provider  acetaminophen (TYLENOL) 500 MG tablet Take 500 mg by mouth every 6 (six) hours as needed for mild pain, moderate pain, fever or headache. Reported on 05/24/2016   Yes [provider]  albuterol (PROVENTIL) (2.5 MG/3ML) 0.083% nebulizer solution Take 3 mLs (2.5 mg total) by nebulization every 6 (six) hours as needed for wheezing or shortness of breath. 12/07/20  Yes Candence Sease, Ines Bloomer, MD  albuterol (VENTOLIN HFA) 108 (90 Base) MCG/ACT inhaler Inhale 1-2 puffs into the lungs every 6 (six) hours as needed for wheezing or shortness of breath. 02/13/19  Yes Forrest Moron, MD  Ascorbic Acid (VITAMIN C) 1000 MG tablet Take 1,000 mg by mouth daily.   Yes [provider]  aspirin EC 81 MG tablet Take 81 mg by mouth daily.   Yes [provider]  atorvastatin (LIPITOR) 40 MG tablet Take 1 tablet (40 mg total) by mouth daily. 02/06/21  Yes Savaya Hakes, Ines Bloomer, MD  calcium carbonate (TUMS EX) 750 MG chewable tablet Chew  1 tablet by mouth daily as needed for heartburn.   Yes [provider]  Carboxymethylcellulose Sodium (ARTIFICIAL TEARS OP) Place 1 drop into both eyes daily as needed (dry eyes).   Yes [provider]  carvedilol (COREG) 12.5 MG tablet Take 12.5 mg by mouth 2 (two) times daily. 10/12/20  Yes [provider]  Cholecalciferol (VITAMIN D3 PO) Take 1,000 Units by mouth daily.   Yes [provider]  Guaifenesin (MUCINEX MAXIMUM STRENGTH) 1200 MG TB12 Take 1,200 mg by mouth 2 (two) times daily.   Yes [provider]  losartan (COZAAR) 100 MG tablet TAKE 1 TABLET BY MOUTH EVERY DAY Patient taking differently: Take 50 mg by mouth 2 (two) times daily. 04/13/21  Yes Carlous Olivares, Ines Bloomer, MD  OMEGA-3 FATTY ACIDS PO Take 1,200 mg by mouth daily.   Yes [provider]  OXYGEN Inhale 2 L into the lungs continuous.   Yes [provider]  sodium chloride (OCEAN) 0.65 % SOLN nasal spray Place 1 spray into both nostrils daily.   Yes [provider]  traMADol (ULTRAM) 50 MG tablet Take 1 tablet (50 mg total) by mouth every 6 (six) hours as needed (BACK PAIN). 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 ([place]) 02/21/2021   Recurrent major depressive disorder, in full remission ([place]) 02/21/2021   Atherosclerosis of  aorta ([place]) 02/21/2021   [place] 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   Chronic diastolic CHF (congestive heart failure) (HCC)    CKD (chronic kidney disease) stage 3, GFR 30-59 ml/min (HCC) 11/10/2020   Allergic rhinitis 01/06/2019   Chronic respiratory failure with hypoxia ([place]) 12/26/2018   Age-related osteoporosis with current pathological fracture 11/21/2018   Renal insufficiency 05/20/2018   History of compression fracture of spine 12/10/2016   Hypercalcemia 06/25/2016   Insomnia 05/16/2016   Pneumonitis, radiation ([place]) 04/11/2016   S/P lobectomy of lung 01/18/2016   Chemotherapy-induced neuropathy ([place]) 11/01/2015   Non-small cell carcinoma of right lung, stage 2 ([place]) 10/03/2015   Hyperlipidemia 08/14/2013   Spondylolisthesis of lumbar region 07/03/2013   Essential hypertension 12/17/2011   COPD GOLD B-C 12/17/2011   Renal cell cancer ([place]) 12/17/2011   Sciatica of left side 12/17/2011   Depression 12/17/2011    Past Medical History:  Diagnosis Date   Anemia    was while doing chemo   Anxiety    Asthma    Chemotherapy-induced neuropathy ([place]) 11/01/2015   Claustrophobia    COPD (chronic obstructive pulmonary disease) ([place])    Depression    Encounter for antineoplastic chemotherapy 02/09/2016   Glaucoma    Headache    prior to menopause   Heart murmur    History of echocardiogram    Echo 2/17: EF 23-55%, grade 1 diastolic dysfunction, mild MR, trivial pericardial effusion   History of nuclear stress test    Myoview 2/17: no ischemia or scar, EF 79%; low risk   History of radiation therapy 10/30/16-11/06/16   Hyperlipidemia    Hypertension    Insomnia 05/16/2016   Non-small cell carcinoma of right lung, stage 2 ([place]) 10/03/2015   Radiation 02/29/16-04/10/16   50.4 Gy to right central chest   Renal cell carcinoma ([place])    L nephrectomy  in 2012    Past Surgical History:  Procedure Laterality Date   BACK SURGERY     cervical 1991   EYE SURGERY     IR  GENERIC HISTORICAL  01/16/2017   IR RADIOLOGIST EVAL & MGMT 01/16/2017 [facility]   IR GENERIC HISTORICAL  01/31/2017   IR FLUORO GUIDED NEEDLE PLC ASPIRATION/INJECTION LOC 01/31/2017 Luanne Bras, MD [facility]   IR GENERIC HISTORICAL  01/31/2017   IR VERTEBROPLASTY CERV/THOR BX INC UNI/BIL INC/INJECT/IMAGING 01/31/2017 Luanne Bras, MD [facility]   IR GENERIC HISTORICAL  01/31/2017   IR VERTEBROPLASTY EA ADDL (T&LS) BX INC UNI/BIL INC INJECT/IMAGING 01/31/2017 Luanne Bras, MD [facility]   kidney cancer     NEPHRECTOMY     SPINE SURGERY     TUBAL LIGATION     VIDEO ASSISTED THORACOSCOPY (VATS)/WEDGE RESECTION Right 01/18/2016   Procedure: VIDEO ASSISTED THORACOSCOPY (VATS)/LUNG RESECTION, THOROCOTOMY, RIGHT UPPER LOBECTOMY, LYMPH NODE DISSECTION, PLACEMENT OF ON Q;  Surgeon: Lilia Argue  Servando Snare, MD;  Location: [facility];  Service: Thoracic;  Laterality: Right;   VIDEO BRONCHOSCOPY Bilateral 09/20/2015   Procedure: VIDEO BRONCHOSCOPY WITHOUT FLUORO;  Surgeon: Rigoberto Noel, MD;  Location: [facility];  Service: Cardiopulmonary;  Laterality: Bilateral;   VIDEO BRONCHOSCOPY N/A 01/18/2016   Procedure: VIDEO BRONCHOSCOPY;  Surgeon: [name], MD;  Location: [facility];  Service: Thoracic;  Laterality: N/A;    Social History   Socioeconomic History   Marital status: Widowed    Spouse name: Not on file   Number of children: 4   Years of education: Not on file   Highest education level: High school graduate  Occupational History   Not on file  Tobacco Use   Smoking status: Former    Packs/day: 1.00    Years: 50.00    Pack years: 50.00    Types: Cigarettes    Quit date: 03/16/2011    Years since quitting: 10.4   Smokeless tobacco: Never   Tobacco comments:    last 4-5 years of smoking, smoked 0.5 pack/day   Vaping Use   Vaping Use: Never used  Substance and Sexual Activity   Alcohol use: No    Alcohol/week: 0.0 standard drinks   Drug use: No   Sexual activity: Not  Currently  Other Topics Concern   Not on file  Social History Narrative   Marital status: divorced; not dating in 2019.      Children: 4 children; 3 grandchildren adult; 4 gg.      Lives: alone in house      Employment: part-time work; substance abuse counselor; [employer].      Tobacco: quit smoking 2012; smoked 45 years      Alcohol: none      Drugs: none      Exercise: none in 2019; due to LEFT sciatica.      ADLs: independent with ADLs; drives.       Advanced Directives: YES: HCPOA: Nicholas Martinez/son.  FULL CODE but no prolonged measures.      Occupation: Substance Abuse Estate agent   No exercise** Merged History Encounter **       ** Data from: 12/14/11 Enc Dept: [facility]       ** Data from: 12/17/11 Enc Dept: [facility]   Substance abuse counselor   Husband deceased   58 great grandchildren   Son works in same substance abuse counseling center as patient   Social Determinants of Radio broadcast assistant Strain: Low Risk    Difficulty of Paying Living Expenses: Not hard at all  Food Insecurity: No Food Insecurity   Worried About Charity fundraiser in the Last Year: Never true   Arboriculturist in the Last Year: Never true  Transportation Needs: No Transportation Needs   Lack of Transportation (Medical): No   Lack of Transportation (Non-Medical): No  Physical Activity: Sufficiently Active   Days of Exercise per Week: 5 days   Minutes of Exercise per Session: 30 min  Stress: No Stress Concern Present   Feeling of Stress : Not at all  Social Connections: Moderately Integrated   Frequency of Communication with Friends and Family: More than three times a week   Frequency of Social Gatherings with Friends and Family: More than three times a week   Attends Religious Services: More than 4 times per year   Active Member of Clubs or Organizations: No  Attends Music therapist: More than 4 times per  year   Marital Status: Divorced  Human resources officer Violence: Not At Risk   Fear of Current or Ex-Partner: No   Emotionally Abused: No   Physically Abused: No   Sexually Abused: No    Family History  Problem Relation Age of Onset   Heart disease Sister    Obesity Brother    Heart attack Daughter 53       s/p CABG   Glaucoma Daughter    Breast cancer Sister    Birth defects Sister    Cancer Mother        Bladder Cancer   Hypertension Mother    CAD Mother 75   Cancer Maternal Grandmother      Review of Systems  Constitutional: Negative.  Negative for chills and fever.  HENT: Negative.  Negative for congestion and sore throat.   Respiratory: Negative.  Negative for cough and shortness of breath.   Cardiovascular: Negative.  Negative for chest pain and palpitations.  Gastrointestinal:  Negative for abdominal pain, diarrhea, nausea and vomiting.  Genitourinary: Negative.  Negative for dysuria and hematuria.  Skin: Negative.  Negative for rash.  Neurological:  Negative for dizziness and headaches.  All other systems reviewed and are negative.   Physical Exam Vitals reviewed.  Constitutional:      Appearance: Normal appearance.  HENT:     Head: Normocephalic.  Eyes:     Extraocular Movements: Extraocular movements intact.     Pupils: Pupils are equal, round, and reactive to light.  Cardiovascular:     Rate and Rhythm: Normal rate and regular rhythm.     Pulses: Normal pulses.     Heart sounds: Normal heart sounds.  Pulmonary:     Effort: Pulmonary effort is normal.     Breath sounds: Normal breath sounds.  Musculoskeletal:     Cervical back: Normal range of motion and neck supple.     Right lower leg: No edema.     Left lower leg: No edema.  Skin:    General: Skin is warm and dry.     Capillary Refill: Capillary refill takes less than 2 seconds.  Neurological:     General: No focal deficit present.     Mental Status: She is alert and oriented to person, place,  and time.  Psychiatric:        Mood and Affect: Mood normal.        Behavior: Behavior normal.     ASSESSMENT & PLAN: Problem List Items Addressed This Visit       Cardiovascular and Mediastinum   Essential hypertension - Primary    Well-controlled hypertension. BP Readings from Last 3 Encounters:  09/06/21 130/70  06/07/21 (!) 163/78  05/18/21 (!) 160/80  Continue losartan 100 mg daily and carvedilol 12.5 mg twice a day.       Relevant Medications   carvedilol (COREG) 12.5 MG tablet   Chronic diastolic CHF (congestive heart failure) (HCC)    Clinically euvolemic.  No acute findings of CHF. Last ejection fraction 60 to 65%      Relevant Medications   carvedilol (COREG) 12.5 MG tablet     Respiratory   COPD GOLD B-C    Well-controlled on Trelegy Ellipta. Used albuterol rescue inhaler once for couple days last week for the first time in 6 months.  Off oxygen.  Does breathing exercises regularly.  Feels her pulmonary function is well controlled and stable.  Genitourinary   CKD (chronic kidney disease) stage 3, GFR 30-59 ml/min (HCC)    Stable.  Advised to stay well-hydrated and avoid NSAIDs Lab Results  Component Value Date   CREATININE 0.98 06/05/2021   BUN 17 06/05/2021   NA 140 06/05/2021   K 4.7 06/05/2021   CL 104 06/05/2021   CO2 28 06/05/2021           Other   Chronic bilateral low back pain without sciatica    Well-controlled with Tylenol.  Takes tramadol only as needed.      Relevant Medications   traMADol (ULTRAM) 50 MG tablet   Patient Instructions  Hypertension, Adult High blood pressure (hypertension) is when the force of blood pumping through the arteries is too strong. The arteries are the blood vessels that carry blood from the heart throughout the body. Hypertension forces the heart to work harder to pump blood and may cause arteries to become narrow or stiff. Untreated or uncontrolled hypertension can cause a heart attack, heart  failure, a stroke, kidney disease, and other problems. A blood pressure reading consists of a higher number over a lower number. Ideally, your blood pressure should be below 120/80. The first ("top") number is called the systolic pressure. It is a measure of the pressure in your arteries as your heart beats. The second ("bottom") number is called the diastolic pressure. It is a measure of the pressure in your arteries as the heart relaxes. What are the causes? The exact cause of this condition is not known. There are some conditions that result in or are related to high blood pressure. What increases the risk? Some risk factors for high blood pressure are under your control. The following factors may make you more likely to develop this condition: Smoking. Having type 2 diabetes mellitus, high cholesterol, or both. Not getting enough exercise or physical activity. Being overweight. Having too much fat, sugar, calories, or salt (sodium) in your diet. Drinking too much alcohol. Some risk factors for high blood pressure may be difficult or impossible to change. Some of these factors include: Having chronic kidney disease. Having a family history of high blood pressure. Age. Risk increases with age. Race. You may be at higher risk if you are African American. Gender. Men are at higher risk than women before age 37. After age 55, women are at higher risk than men. Having obstructive sleep apnea. Stress. What are the signs or symptoms? High blood pressure may not cause symptoms. Very high blood pressure (hypertensive crisis) may cause: Headache. Anxiety. Shortness of breath. Nosebleed. Nausea and vomiting. Vision changes. Severe chest pain. Seizures. How is this diagnosed? This condition is diagnosed by measuring your blood pressure while you are seated, with your arm resting on a flat surface, your legs uncrossed, and your feet flat on the floor. The cuff of the blood pressure monitor will  be placed directly against the skin of your upper arm at the level of your heart. It should be measured at least twice using the same arm. Certain conditions can cause a difference in blood pressure between your right and left arms. Certain factors can cause blood pressure readings to be lower or higher than normal for a short period of time: When your blood pressure is higher when you are in a health care provider's office than when you are at home, this is called white coat hypertension. Most people with this condition do not need medicines. When your blood pressure is higher at home  than when you are in a health care provider's office, this is called masked hypertension. Most people with this condition may need medicines to control blood pressure. If you have a high blood pressure reading during one visit or you have normal blood pressure with other risk factors, you may be asked to: Return on a different day to have your blood pressure checked again. Monitor your blood pressure at home for 1 week or longer. If you are diagnosed with hypertension, you may have other blood or imaging tests to help your health care provider understand your overall risk for other conditions. How is this treated? This condition is treated by making healthy lifestyle changes, such as eating healthy foods, exercising more, and reducing your alcohol intake. Your health care provider may prescribe medicine if lifestyle changes are not enough to get your blood pressure under control, and if: Your systolic blood pressure is above 130. Your diastolic blood pressure is above 80. Your personal target blood pressure may vary depending on your medical conditions, your age, and other factors. Follow these instructions at home: Eating and drinking  Eat a diet that is high in fiber and potassium, and low in sodium, added sugar, and fat. An example eating plan is called the DASH (Dietary Approaches to Stop Hypertension) diet. To eat  this way: Eat plenty of fresh fruits and vegetables. Try to fill one half of your plate at each meal with fruits and vegetables. Eat whole grains, such as whole-wheat pasta, brown rice, or whole-grain bread. Fill about one fourth of your plate with whole grains. Eat or drink low-fat dairy products, such as skim milk or low-fat yogurt. Avoid fatty cuts of meat, processed or cured meats, and poultry with skin. Fill about one fourth of your plate with lean proteins, such as fish, chicken without skin, beans, eggs, or tofu. Avoid pre-made and processed foods. These tend to be higher in sodium, added sugar, and fat. Reduce your daily sodium intake. Most people with hypertension should eat less than 1,500 mg of sodium a day. Do not drink alcohol if: Your health care provider tells you not to drink. You are pregnant, may be pregnant, or are planning to become pregnant. If you drink alcohol: Limit how much you use to: 0-1 drink a day for women. 0-2 drinks a day for men. Be aware of how much alcohol is in your drink. In the [place] one drink equals one 12 oz bottle of beer (355 mL), one 5 oz glass of wine (148 mL), or one 1 oz glass of hard liquor (44 mL). Lifestyle  Work with your health care provider to maintain a healthy body weight or to lose weight. Ask what an ideal weight is for you. Get at least 30 minutes of exercise most days of the week. Activities may include walking, swimming, or biking. Include exercise to strengthen your muscles (resistance exercise), such as Pilates or lifting weights, as part of your weekly exercise routine. Try to do these types of exercises for 30 minutes at least 3 days a week. Do not use any products that contain nicotine or tobacco, such as cigarettes, e-cigarettes, and chewing tobacco. If you need help quitting, ask your health care provider. Monitor your blood pressure at home as told by your health care provider. Keep all follow-up visits as told by your health  care provider. This is important. Medicines Take over-the-counter and prescription medicines only as told by your health care provider. Follow directions carefully. Blood pressure medicines must  be taken as prescribed. Do not skip doses of blood pressure medicine. Doing this puts you at risk for problems and can make the medicine less effective. Ask your health care provider about side effects or reactions to medicines that you should watch for. Contact a health care provider if you: Think you are having a reaction to a medicine you are taking. Have headaches that keep coming back (recurring). Feel dizzy. Have swelling in your ankles. Have trouble with your vision. Get help right away if you: Develop a severe headache or confusion. Have unusual weakness or numbness. Feel faint. Have severe pain in your chest or abdomen. Vomit repeatedly. Have trouble breathing. Summary Hypertension is when the force of blood pumping through your arteries is too strong. If this condition is not controlled, it may put you at risk for serious complications. Your personal target blood pressure may vary depending on your medical conditions, your age, and other factors. For most people, a normal blood pressure is less than 120/80. Hypertension is treated with lifestyle changes, medicines, or a combination of both. Lifestyle changes include losing weight, eating a healthy, low-sodium diet, exercising more, and limiting alcohol. This information is not intended to replace advice given to you by your health care provider. Make sure you discuss any questions you have with your health care provider. Document Revised: 07/09/2018 Document Reviewed: 07/09/2018 Elsevier Patient Education  2022 [place], MD [facility]

## 2021-09-06 NOTE — Patient Instructions (Signed)

## 2021-09-06 NOTE — Assessment & Plan Note (Addendum)
Stable.  Advised to stay well-hydrated and avoid NSAIDs Lab Results  Component Value Date   CREATININE 0.98 06/05/2021   BUN 17 06/05/2021   NA 140 06/05/2021   K 4.7 06/05/2021   CL 104 06/05/2021   CO2 28 06/05/2021

## 2021-09-25 ENCOUNTER — Telehealth: Payer: Medicare Other

## 2021-09-25 NOTE — Progress Notes (Deleted)
Chronic Care Management Pharmacy Note  09/25/2021 Name:  Tammy Ball MRN:  [id number] DOB:  [birth year]  BP Readings from Last 3 Encounters:  09/06/21 130/70  06/07/21 (!) 163/78  05/18/21 (!) 160/80     Summary: -Patient reports that blood pressure has been slightly elevated recently, reports that at home it is typically averaging ~143/78-82 with a heart rate in the 70's, reports to mild edema in legs - more noticeable on days that she is more active -Notes that COPD has been well controlled, infrequent use of rescue inhaler, is to see pulmonology within the next month where the plan is to step down maintenance inhaler form trelegy to Stiolto, Anoro, or [employer] -Reports that pain levels have been controlled on tramadol and acetaminophen use - using each medication infrequently -LDL well controlled and at goal, patient denies any issues with medications  Recommendations/Changes made from today's visit: -Recommending for patient to start wearing compression socks to help reduce swelling in legs -Patient to split losartan dosing to help better control blood pressure (did not wish to trial increase in coreg dosing at this time).  Patient will continue to monitor blood pressure 1-2 times daily as she has been doing  Subjective: Tammy Ball is an 80 y.o. year old female who is a primary patient of [name], Ines Bloomer, MD.  The CCM team was consulted for assistance with disease management and care coordination needs.    Engaged with patient by telephone for initial visit in response to provider referral for pharmacy case management and/or care coordination services.   Consent to Services:  The patient was given the following information about Chronic Care Management services today, agreed to services, and gave verbal consent: 1. CCM service includes personalized support from designated clinical staff supervised by the primary care provider, including individualized plan of care and  coordination with other care providers 2. 24/7 contact phone numbers for assistance for urgent and routine care needs. 3. Service will only be billed when office clinical staff spend 20 minutes or more in a month to coordinate care. 4. Only one practitioner may furnish and bill the service in a calendar month. 5.The patient may stop CCM services at any time (effective at the end of the month) by phone call to the office staff. 6. The patient will be responsible for cost sharing (co-pay) of up to 20% of the service fee (after annual deductible is met). Patient agreed to services and consent obtained.  Patient Care Team: Horald Pollen, MD as PCP - General (Internal Medicine) Curt Bears, MD as Consulting Physician (Oncology) [name], MD (Inactive) as Consulting Physician (Cardiothoracic Surgery) Calvert Cantor, MD as Consulting Physician (Ophthalmology) Delice Bison Darnelle Maffucci, [facility] as Pharmacist (Pharmacist)  Recent office visits:  09/06/2021 - Dr. [name] - no changes - f/u in 6 months   Recent consult visits:  06/07/21-Mohamed Julien Nordmann, MD (Oncology) Seen for Non-small cell lung cancer. Recommended for her to continue on observation with repeat CT scan of the chest in 6 months. Follow up in 6 months. 01/04/21-Robert Agustina Caroli, MD (Pulmonology) Follow up visit - continue trelegy for now, possible alternative inhalers will be discussed with follow up appointment (6 months)  Hospital visits:  None in previous 6 months  Objective:  Lab Results  Component Value Date   CREATININE 0.98 06/05/2021   BUN 17 06/05/2021   GFRNONAA 59 (L) 06/05/2021   GFRAA 51 (L) 05/30/2020   NA 140 06/05/2021   K 4.7 06/05/2021  CALCIUM 10.0 06/05/2021   CO2 28 06/05/2021   GLUCOSE 101 (H) 06/05/2021    No results found for: HGBA1C, FRUCTOSAMINE, GFR, MICROALBUR  Last diabetic Eye exam:  No results found for: HMDIABEYEEXA  Last diabetic Foot exam:  No results found for: HMDIABFOOTEX    Lab Results  Component Value Date   CHOL 169 05/30/2020   HDL 89 05/30/2020   LDLCALC 68 05/30/2020   TRIG 61 05/30/2020   CHOLHDL 1.9 05/30/2020    Hepatic Function Latest Ref Rng & Units 06/05/2021 12/02/2020 11/10/2020  Total Protein 6.5 - 8.1 g/dL 7.0 6.8 7.2  Albumin 3.5 - 5.0 g/dL 3.9 3.9 3.4(L)  AST 15 - 41 U/L 18 21 25   ALT 0 - 44 U/L 14 22 23   Alk Phosphatase 38 - 126 U/L 98 81 88  Total Bilirubin 0.3 - 1.2 mg/dL 0.7 0.7 0.7  Bilirubin, Direct 0.1 - 0.5 mg/dL - - -    Lab Results  Component Value Date/Time   TSH 1.320 11/21/2018 10:58 AM   TSH 1.88 10/02/2016 02:14 PM   FREET4 1.31 12/14/2011 01:43 PM   FREET4 1.13 04/21/2011 12:05 PM    CBC Latest Ref Rng & Units 06/05/2021 12/02/2020 11/14/2020  WBC 4.0 - 10.5 K/uL 7.4 6.7 9.1  Hemoglobin 12.0 - 15.0 g/dL 12.9 12.0 12.3  Hematocrit 36.0 - 46.0 % 39.1 36.5 38.4  Platelets 150 - 400 K/uL 248 192 445(H)    No results found for: VD25OH  Clinical ASCVD: No  The 10-year ASCVD risk score (Arnett DK, et al., 2019) is: 30.8%   Values used to calculate the score:     Age: 80 years     Sex: Female     Is Non-Hispanic African American: No     Diabetic: No     Tobacco smoker: No     Systolic Blood Pressure: 315 mmHg     Is BP treated: Yes     HDL Cholesterol: 89 mg/dL     Total Cholesterol: 169 mg/dL    Depression screen [facility] 2/9 09/06/2021 05/18/2021 02/21/2021  Decreased Interest 0 1 1  Down, Depressed, Hopeless 0 1 2  PHQ - 2 Score 0 2 3  Altered sleeping - - 2  Tired, decreased energy - - 2  Change in appetite - - 0  Feeling bad or failure about yourself  - - 0  Trouble concentrating - - 0  Moving slowly or fidgety/restless - - 0  Suicidal thoughts - - 0  PHQ-9 Score - - 7  Difficult doing work/chores - - -  Some recent data might be hidden     Social History   Tobacco Use  Smoking Status Former   Packs/day: 1.00   Years: 50.00   Pack years: 50.00   Types: Cigarettes   Quit date: 03/16/2011   Years  since quitting: 10.5  Smokeless Tobacco Never  Tobacco Comments   last 4-5 years of smoking, smoked 0.5 pack/day    BP Readings from Last 3 Encounters:  09/06/21 130/70  06/07/21 (!) 163/78  05/18/21 (!) 160/80   Pulse Readings from Last 3 Encounters:  09/06/21 71  06/07/21 71  05/18/21 75   Wt Readings from Last 3 Encounters:  09/06/21 183 lb (83 kg)  06/07/21 187 lb 3.2 oz (84.9 kg)  05/18/21 185 lb 12.8 oz (84.3 kg)   BMI Readings from Last 3 Encounters:  09/06/21 31.41 kg/m  06/07/21 32.13 kg/m  05/18/21 31.89 kg/m    Assessment/Interventions:  Review of patient past medical history, allergies, medications, health status, including review of consultants reports, laboratory and other test data, was performed as part of comprehensive evaluation and provision of chronic care management services.   SDOH:  (Social Determinants of Health) assessments and interventions performed: Yes  SDOH Screenings   Alcohol Screen: Low Risk    Last Alcohol Screening Score (AUDIT): 0  Depression (PHQ2-9): Low Risk    PHQ-2 Score: 0  Financial Resource Strain: Low Risk    Difficulty of Paying Living Expenses: Not hard at all  Food Insecurity: No Food Insecurity   Worried About Charity fundraiser in the Last Year: Never true   Ran Out of Food in the Last Year: Never true  Housing: Low Risk    Last Housing Risk Score: 0  Physical Activity: Sufficiently Active   Days of Exercise per Week: 5 days   Minutes of Exercise per Session: 30 min  Social Connections: Moderately Integrated   Frequency of Communication with Friends and Family: More than three times a week   Frequency of Social Gatherings with Friends and Family: More than three times a week   Attends Religious Services: More than 4 times per year   Active Member of [employer] or Organizations: No   Attends Music therapist: More than 4 times per year   Marital Status: Divorced  Stress: No Stress Concern Present    Feeling of Stress : Not at all  Tobacco Use: Medium Risk   Smoking Tobacco Use: Former   Smokeless Tobacco Use: Never   Passive Exposure: Not on Pensions consultant Needs: No Transportation Needs   Lack of Transportation (Medical): No   Lack of Transportation (Non-Medical): No    CCM Care Plan  Allergies  Allergen Reactions   Bee Venom Anaphylaxis, Shortness Of Breath, Swelling and Other (See Comments)    Swelling at site    Amlodipine Swelling and Other (See Comments)    Swelling of the ankles and hands    Levofloxacin Other (See Comments)    Joint pain    Alendronate     Joint pains / hypercalcemia    Hctz [Hydrochlorothiazide] Palpitations and Other (See Comments)    Sweating     Medications Reviewed Today     Reviewed by Horald Pollen, MD (Physician) on 09/06/21 at 1153  Med List Status: <None>   Medication Order Taking? Sig Documenting Provider Last Dose Status Informant  acetaminophen (TYLENOL) 500 MG tablet [id number] Yes Take 500 mg by mouth every 6 (six) hours as needed for mild pain, moderate pain, fever or headache. Reported on 05/24/2016 [provider] Taking Active Self  albuterol (PROVENTIL) (2.5 MG/3ML) 0.083% nebulizer solution [id number] Yes Take 3 mLs (2.5 mg total) by nebulization every 6 (six) hours as needed for wheezing or shortness of breath. Horald Pollen, MD Taking Active   albuterol (VENTOLIN HFA) 108 [phone] Base) MCG/ACT inhaler [id number] Yes Inhale 1-2 puffs into the lungs every 6 (six) hours as needed for wheezing or shortness of breath. Forrest Moron, MD Taking Active Self  Ascorbic Acid (VITAMIN C) 1000 MG tablet [id number] Yes Take 1,000 mg by mouth daily. [provider] Taking Active Self  aspirin EC 81 MG tablet [id number] Yes Take 81 mg by mouth daily. [provider] Taking Active Self           Med Note Tedra Senegal D   Wed Jan 07, 2019  1:56 PM)    atorvastatin (LIPITOR)  40 MG tablet [id number] Yes  Take 1 tablet (40 mg total) by mouth daily. Horald Pollen, MD Taking Active   calcium carbonate (TUMS EX) 750 MG chewable tablet [id number] Yes Chew 1 tablet by mouth daily as needed for heartburn. [provider] Taking Active Self  Carboxymethylcellulose Sodium (ARTIFICIAL TEARS OP) [id number] Yes Place 1 drop into both eyes daily as needed (dry eyes). [provider] Taking Active Self  carvedilol (COREG) 12.5 MG tablet [id number]  Take 1 tablet (12.5 mg total) by mouth 2 (two) times daily. Horald Pollen, MD  Active   Cholecalciferol (VITAMIN D3 PO) [id number] Yes Take 1,000 Units by mouth daily. [provider] Taking Active Self  Guaifenesin [facility] MAXIMUM STRENGTH) 1200 MG TB12 [id number] Yes Take 1,200 mg by mouth 2 (two) times daily. [provider] Taking Active Self  losartan (COZAAR) 100 MG tablet [id number] Yes TAKE 1 TABLET BY MOUTH EVERY DAY  Patient taking differently: Take 50 mg by mouth 2 (two) times daily.   Horald Pollen, MD Taking Active   OMEGA-3 FATTY ACIDS PO [id number] Yes Take 1,200 mg by mouth daily. [provider] Taking Active Self  OXYGEN [id number] Yes Inhale 2 L into the lungs continuous. [provider] Taking Active Self           Med Note Blanch Media, CYNTHIA A   Tue Feb 21, 2021 10:14 AM) use  sodium chloride (OCEAN) 0.65 % SOLN nasal spray [id number] Yes Place 1 spray into both nostrils daily. [provider] Taking Active Self  traMADol (ULTRAM) 50 MG tablet [id number]  Take 1 tablet (50 mg total) by mouth every 6 (six) hours as needed (BACK PAIN). 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   Chronic diastolic CHF (congestive heart failure) (HCC)    CKD (chronic kidney disease) stage 3, GFR 30-59 ml/min (HCC) 11/10/2020   Allergic rhinitis 01/06/2019   Chronic respiratory failure with hypoxia ([place]) 12/26/2018   Age-related osteoporosis with current pathological fracture 11/21/2018   Renal insufficiency 05/20/2018   History of compression fracture of spine 12/10/2016   Hypercalcemia 06/25/2016   Insomnia 05/16/2016   Pneumonitis, radiation ([place]) 04/11/2016   S/P lobectomy of lung 01/18/2016   Chemotherapy-induced neuropathy ([place]) 11/01/2015   Non-small cell carcinoma of right lung, stage 2 ([place]) 10/03/2015   Hyperlipidemia 08/14/2013   Spondylolisthesis of lumbar region 07/03/2013   Essential hypertension 12/17/2011   COPD GOLD B-C 12/17/2011   Renal cell cancer ([place]) 12/17/2011   Sciatica of left side 12/17/2011   Depression 12/17/2011    Immunization History  Administered Date(s) Administered   Fluad Quad(high Dose 65+) 07/18/2019   Hepatitis B 11/12/1998   Influenza Split 09/12/2012   Influenza, High Dose Seasonal PF 08/23/2017, 08/23/2018   Influenza,inj,Quad PF,6+ Mos 07/18/2013   Influenza-Unspecified 07/29/2014, 07/23/2015, 08/25/2016, 07/28/2020, 08/21/2021   PFIZER(Purple Top)SARS-COV-2 Vaccination 12/24/2019, 01/18/2020, 05/07/2020, 03/23/2021, 08/21/2021   Pneumococcal Conjugate-13 07/29/2015   Pneumococcal Polysaccharide-23 11/12/2000, 10/23/2012,  08/23/2017   Pneumococcal-Unspecified 10/23/2012, 08/23/2017   Tdap 12/02/2013   Zoster, Live 12/13/2010    Conditions to be addressed/monitored:  Hypertension, Hyperlipidemia, Heart Failure, COPD, Chronic Kidney Disease, Osteopenia, Allergic Rhinitis, and Chronic Pain  There are no care plans  that you recently modified to display for this patient.      Medication Assistance: Trelegy obtained through [place] medication assistance program.   Patient's preferred pharmacy is:  [id number] [place], [place] [address] [address]Phone: [phone]: [phone]  Uses pill box? Yes Pt endorses 100% compliance  Care Plan and Follow Up Patient Decision:  Patient agrees to Care Plan and Follow-up.  Plan: Telephone follow up appointment with care management team member scheduled for:  4-6 weeks and The patient has been provided with contact information for the care management team and has been advised to call with any health related questions or concerns.   DTomasa Blase PharmD Clinical Pharmacist, [place]   Chart prep  = 6 minutes  ***

## 2021-10-05 DIAGNOSIS — Z20828 Contact with and (suspected) exposure to other viral communicable diseases: Secondary | ICD-10-CM | POA: Diagnosis not present

## 2021-10-24 ENCOUNTER — Other Ambulatory Visit: Payer: Self-pay | Admitting: Emergency Medicine

## 2021-10-25 ENCOUNTER — Encounter: Payer: Self-pay | Admitting: Emergency Medicine

## 2021-10-25 ENCOUNTER — Other Ambulatory Visit: Payer: Self-pay

## 2021-10-25 DIAGNOSIS — J449 Chronic obstructive pulmonary disease, unspecified: Secondary | ICD-10-CM

## 2021-10-25 DIAGNOSIS — R0602 Shortness of breath: Secondary | ICD-10-CM

## 2021-10-25 MED ORDER — ALBUTEROL SULFATE HFA 108 (90 BASE) MCG/ACT IN AERS: 1.0000 | INHALATION_SPRAY | Freq: Four times a day (QID) | RESPIRATORY_TRACT | 6 refills | Status: DC | PRN

## 2021-12-06 ENCOUNTER — Telehealth: Payer: Self-pay

## 2021-12-06 NOTE — Chronic Care Management (AMB) (Signed)
Chronic Care Management Pharmacy Assistant   Name: Tammy Ball  MRN: [id number] DOB: 01/05/1941   Reason for Encounter: Disease State-General    Recent office visits:  09/06/21 Horald Pollen, MD-PCP (hypertension) no orders or med changes  Recent consult visits:  None ID  Hospital visits:  None in previous 6 months  Medications: Outpatient Encounter Medications as of 12/06/2021  Medication Sig Note   acetaminophen (TYLENOL) 500 MG tablet Take 500 mg by mouth every 6 (six) hours as needed for mild pain, moderate pain, fever or headache. Reported on 05/24/2016    albuterol (PROVENTIL) (2.5 MG/3ML) 0.083% nebulizer solution Take 3 mLs (2.5 mg total) by nebulization every 6 (six) hours as needed for wheezing or shortness of breath.    albuterol (VENTOLIN HFA) 108 (90 Base) MCG/ACT inhaler Inhale 1-2 puffs into the lungs every 6 (six) hours as needed for wheezing or shortness of breath.    Ascorbic Acid (VITAMIN C) 1000 MG tablet Take 1,000 mg by mouth daily.    aspirin EC 81 MG tablet Take 81 mg by mouth daily.    atorvastatin (LIPITOR) 40 MG tablet Take 1 tablet (40 mg total) by mouth daily.    calcium carbonate (TUMS EX) 750 MG chewable tablet Chew 1 tablet by mouth daily as needed for heartburn.    Carboxymethylcellulose Sodium (ARTIFICIAL TEARS OP) Place 1 drop into both eyes daily as needed (dry eyes).    carvedilol (COREG) 12.5 MG tablet Take 1 tablet (12.5 mg total) by mouth 2 (two) times daily.    Cholecalciferol (VITAMIN D3 PO) Take 1,000 Units by mouth daily.    Guaifenesin (MUCINEX MAXIMUM STRENGTH) 1200 MG TB12 Take 1,200 mg by mouth 2 (two) times daily.    losartan (COZAAR) 100 MG tablet TAKE 1 TABLET BY MOUTH EVERY DAY    OMEGA-3 FATTY ACIDS PO Take 1,200 mg by mouth daily.    OXYGEN Inhale 2 L into the lungs continuous. 02/21/2021: use   sodium chloride (OCEAN) 0.65 % SOLN nasal spray Place 1 spray into both nostrils daily.    traMADol (ULTRAM) 50 MG tablet  Take 1 tablet (50 mg total) by mouth every 6 (six) hours as needed (BACK PAIN).    TRELEGY ELLIPTA 100-62.5-25 MCG/INH AEPB INHALE 1 PUFF BY MOUTH EVERY DAY    No facility-administered encounter medications on file as of 12/06/2021.     Recent Office Vitals: BP Readings from Last 3 Encounters:  09/06/21 130/70  06/07/21 (!) 163/78  05/18/21 (!) 160/80   Pulse Readings from Last 3 Encounters:  09/06/21 71  06/07/21 71  05/18/21 75    Wt Readings from Last 3 Encounters:  09/06/21 183 lb (83 kg)  06/07/21 187 lb 3.2 oz (84.9 kg)  05/18/21 185 lb 12.8 oz (84.3 kg)     Kidney Function Lab Results  Component Value Date/Time   CREATININE 0.98 06/05/2021 10:51 AM   CREATININE 1.13 (H) 12/02/2020 09:15 AM   CREATININE 1.3 (H) 11/11/2017 09:39 AM   CREATININE 1.1 05/06/2017 08:32 AM   GFRNONAA 59 (L) 06/05/2021 10:51 AM   GFRNONAA 52 (L) 12/02/2013 09:11 AM   GFRAA 51 (L) 05/30/2020 11:03 AM   GFRAA 60 12/02/2013 09:11 AM    BMP Latest Ref Rng & Units 06/05/2021 12/02/2020 11/14/2020  Glucose 70 - 99 mg/dL 101(H) 101(H) 124(H)  BUN 8 - 23 mg/dL 17 21 41(H)  Creatinine 0.44 - 1.00 mg/dL 0.98 1.13(H) 1.12(H)  BUN/Creat Ratio 12 - 28 - - -  Sodium 135 - 145 mmol/L 140 137 139  Potassium 3.5 - 5.1 mmol/L 4.7 5.3(H) 5.0  Chloride 98 - 111 mmol/L 104 100 96(L)  CO2 22 - 32 mmol/L 28 30 32  Calcium 8.9 - 10.3 mg/dL 10.0 9.7 9.9   Reviewed chart prior to disease state call. Spoke with patient regarding BP  Current antihypertensive regimen:  Losartan 100 mg Carvedilol 12.5 mg  How often are you checking your Blood Pressure? daily  Current home BP readings: 141/78 on 12/10/21  What recent interventions/DTPs have been made by any provider to improve Blood Pressure control since last CPP Visit: none noted  Any recent hospitalizations or ED visits since last visit with CPP? No  What diet changes have been made to improve Blood Pressure Control?  Patient states that she does not add  salt to foods and rinse off can goods  What exercise is being done to improve your Blood Pressure Control?  Patient states she id doing yoga every day   Note: Patient states that she has an upper respiratory infection and states that she needs to have albuterol Nebulizer called in to the pharmacy. She believes it will help her breathing   Adherence Review: Is the patient currently on ACE/ARB medication? Yes Does the patient have >5 day gap between last estimated fill dates? No    Care Gaps: Colonoscopy-NA Diabetic Foot Exam-NA Mammogram-NA Ophthalmology-NA Dexa Scan - NA Annual Well Visit - NA Micro albumin-NA Hemoglobin A1c-NA  Star Rating Drugs: Losartan 100 mg-last fill 10/24/21 90 ds Atorvastatin 40 mg-last fill 08/06/21 90 ds  Ethelene Hal Clinical Pharmacist Assistant [phone]

## 2021-12-08 ENCOUNTER — Other Ambulatory Visit: Payer: Self-pay

## 2021-12-08 ENCOUNTER — Ambulatory Visit (HOSPITAL_COMMUNITY)
Admission: RE | Admit: 2021-12-08 | Discharge: 2021-12-08 | Disposition: A | Discharge status: Home / Self Care | Payer: Medicare Other | Source: Ambulatory Visit | Attending: Internal Medicine | Admitting: Internal Medicine

## 2021-12-08 ENCOUNTER — Inpatient Hospital Stay: Payer: Medicare Other | Attending: Internal Medicine

## 2021-12-08 ENCOUNTER — Encounter (HOSPITAL_COMMUNITY): Payer: Self-pay

## 2021-12-08 DIAGNOSIS — R911 Solitary pulmonary nodule: Secondary | ICD-10-CM | POA: Diagnosis not present

## 2021-12-08 DIAGNOSIS — C3490 Malignant neoplasm of unspecified part of unspecified bronchus or lung: Secondary | ICD-10-CM

## 2021-12-08 DIAGNOSIS — I700 Atherosclerosis of aorta: Secondary | ICD-10-CM | POA: Diagnosis not present

## 2021-12-08 DIAGNOSIS — J439 Emphysema, unspecified: Secondary | ICD-10-CM | POA: Diagnosis not present

## 2021-12-08 LAB — CMP (CANCER CENTER ONLY)
ALT: 16 U/L (ref 0–44)
AST: 17 U/L (ref 15–41)
Albumin: 4.1 g/dL (ref 3.5–5.0)
Alkaline Phosphatase: 95 U/L (ref 38–126)
Anion gap: 8 (ref 5–15)
BUN: 16 mg/dL (ref 8–23)
CO2: 28 mmol/L (ref 22–32)
Calcium: 9.3 mg/dL (ref 8.9–10.3)
Chloride: 102 mmol/L (ref 98–111)
Creatinine: 1.15 mg/dL — ABNORMAL HIGH (ref 0.44–1.00)
GFR, Estimated: 49 mL/min — ABNORMAL LOW (ref >60)
Glucose, Bld: 100 mg/dL — ABNORMAL HIGH (ref 70–99)
Potassium: 4.3 mmol/L (ref 3.5–5.1)
Sodium: 138 mmol/L (ref 135–145)
Total Bilirubin: 0.6 mg/dL (ref 0.3–1.2)
Total Protein: 6.9 g/dL (ref 6.5–8.1)

## 2021-12-08 LAB — CBC WITH DIFFERENTIAL (CANCER CENTER ONLY)
Abs Immature Granulocytes: 0.02 10*3/uL (ref 0.00–0.07)
Basophils Absolute: 0.1 10*3/uL (ref 0.0–0.1)
Basophils Relative: 1 %
Eosinophils Absolute: 0.2 10*3/uL (ref 0.0–0.5)
Eosinophils Relative: 3 %
HCT: 39.8 % (ref 36.0–46.0)
Hemoglobin: 12.6 g/dL (ref 12.0–15.0)
Immature Granulocytes: 0 %
Lymphocytes Relative: 22 %
Lymphs Abs: 1.4 10*3/uL (ref 0.7–4.0)
MCH: 28.6 pg (ref 26.0–34.0)
MCHC: 31.7 g/dL (ref 30.0–36.0)
MCV: 90.2 fL (ref 80.0–100.0)
Monocytes Absolute: 0.6 10*3/uL (ref 0.1–1.0)
Monocytes Relative: 10 %
Neutro Abs: 4 10*3/uL (ref 1.7–7.7)
Neutrophils Relative %: 64 %
Platelet Count: 357 10*3/uL (ref 150–400)
RBC: 4.41 MIL/uL (ref 3.87–5.11)
RDW: 17.2 % — ABNORMAL HIGH (ref 11.5–15.5)
WBC Count: 6.3 10*3/uL (ref 4.0–10.5)
nRBC: 0 % (ref 0.0–0.2)

## 2021-12-08 MED ORDER — SODIUM CHLORIDE (PF) 0.9 % IJ SOLN
INTRAMUSCULAR | Status: AC
  Filled 2021-12-08: qty 50

## 2021-12-08 MED ORDER — IOHEXOL 300 MG/ML  SOLN
75.0000 mL | Freq: Once | INTRAMUSCULAR | Status: AC | PRN
  Administered 2021-12-08: 60 mL via INTRAVENOUS

## 2021-12-10 ENCOUNTER — Telehealth: Payer: Medicare Other | Admitting: Nurse Practitioner

## 2021-12-10 DIAGNOSIS — B9689 Other specified bacterial agents as the cause of diseases classified elsewhere: Secondary | ICD-10-CM

## 2021-12-10 DIAGNOSIS — J069 Acute upper respiratory infection, unspecified: Secondary | ICD-10-CM

## 2021-12-10 MED ORDER — AZITHROMYCIN 250 MG PO TABS: ORAL_TABLET | ORAL | 0 refills | Status: AC

## 2021-12-10 NOTE — Progress Notes (Signed)

## 2021-12-10 NOTE — Progress Notes (Signed)
I have spent 5 minutes in review of e-visit questionnaire, review and updating patient chart, medical decision making and response to patient.  ° °Yaser Harvill W Charity Tessier, NP ° °  °

## 2021-12-11 ENCOUNTER — Inpatient Hospital Stay: Payer: Medicare Other | Admitting: Internal Medicine

## 2021-12-11 ENCOUNTER — Telehealth: Payer: Self-pay

## 2021-12-11 NOTE — Telephone Encounter (Signed)
Called patient regarding her missed appointment today 12/11/2021. Patient did not answer, so I left a vm.

## 2021-12-11 NOTE — Progress Notes (Signed)
Error unable to close, duplicate note

## 2021-12-20 DIAGNOSIS — H5213 Myopia, bilateral: Secondary | ICD-10-CM | POA: Diagnosis not present

## 2021-12-20 DIAGNOSIS — H18593 Other hereditary corneal dystrophies, bilateral: Secondary | ICD-10-CM | POA: Diagnosis not present

## 2021-12-20 DIAGNOSIS — H40013 Open angle with borderline findings, low risk, bilateral: Secondary | ICD-10-CM | POA: Diagnosis not present

## 2021-12-20 DIAGNOSIS — Z961 Presence of intraocular lens: Secondary | ICD-10-CM | POA: Diagnosis not present

## 2021-12-22 DIAGNOSIS — Z20822 Contact with and (suspected) exposure to covid-19: Secondary | ICD-10-CM | POA: Diagnosis not present

## 2021-12-25 ENCOUNTER — Other Ambulatory Visit: Payer: Self-pay

## 2021-12-25 ENCOUNTER — Inpatient Hospital Stay: Payer: Medicare Other | Attending: Internal Medicine | Admitting: Internal Medicine

## 2021-12-25 VITALS — BP 153/87 | HR 82 | Temp 97.3°F | Resp 20 | Ht 64.0 in | Wt 183.5 lb

## 2021-12-25 DIAGNOSIS — J432 Centrilobular emphysema: Secondary | ICD-10-CM | POA: Insufficient documentation

## 2021-12-25 DIAGNOSIS — D649 Anemia, unspecified: Secondary | ICD-10-CM | POA: Diagnosis not present

## 2021-12-25 DIAGNOSIS — R011 Cardiac murmur, unspecified: Secondary | ICD-10-CM | POA: Diagnosis not present

## 2021-12-25 DIAGNOSIS — Z923 Personal history of irradiation: Secondary | ICD-10-CM | POA: Insufficient documentation

## 2021-12-25 DIAGNOSIS — F419 Anxiety disorder, unspecified: Secondary | ICD-10-CM | POA: Insufficient documentation

## 2021-12-25 DIAGNOSIS — Z85528 Personal history of other malignant neoplasm of kidney: Secondary | ICD-10-CM | POA: Insufficient documentation

## 2021-12-25 DIAGNOSIS — C3490 Malignant neoplasm of unspecified part of unspecified bronchus or lung: Secondary | ICD-10-CM | POA: Diagnosis not present

## 2021-12-25 DIAGNOSIS — M8580 Other specified disorders of bone density and structure, unspecified site: Secondary | ICD-10-CM | POA: Diagnosis not present

## 2021-12-25 DIAGNOSIS — I700 Atherosclerosis of aorta: Secondary | ICD-10-CM | POA: Insufficient documentation

## 2021-12-25 DIAGNOSIS — Z7982 Long term (current) use of aspirin: Secondary | ICD-10-CM | POA: Diagnosis not present

## 2021-12-25 DIAGNOSIS — E785 Hyperlipidemia, unspecified: Secondary | ICD-10-CM | POA: Diagnosis not present

## 2021-12-25 DIAGNOSIS — C3411 Malignant neoplasm of upper lobe, right bronchus or lung: Secondary | ICD-10-CM | POA: Insufficient documentation

## 2021-12-25 DIAGNOSIS — C3491 Malignant neoplasm of unspecified part of right bronchus or lung: Secondary | ICD-10-CM

## 2021-12-25 DIAGNOSIS — J449 Chronic obstructive pulmonary disease, unspecified: Secondary | ICD-10-CM | POA: Diagnosis not present

## 2021-12-25 DIAGNOSIS — Z79899 Other long term (current) drug therapy: Secondary | ICD-10-CM | POA: Diagnosis not present

## 2021-12-25 DIAGNOSIS — I10 Essential (primary) hypertension: Secondary | ICD-10-CM | POA: Insufficient documentation

## 2021-12-25 NOTE — Progress Notes (Signed)
[facility] [phone]   [phone]  OFFICE PROGRESS NOTE  Horald Pollen, MD [place] [address]  DIAGNOSIS: Stage IIIA (T2a, N2, M0) non-small cell lung cancer, squamous cell carcinoma presented with large right upper lobe lung mass with right hilar involvement diagnosed in November 2016.  PRIOR THERAPY:  1) Neoadjuvant systemic chemotherapy with carboplatin for AUC of 6 and paclitaxel 200 MG/M2 every 3 weeks, status post 3 cycles with partial response. 2) Video bronchoscopy, right video-assisted thoracoscopy, minithoracotomy, right upper lobectomy with lymph node dissection on 01/18/2016. The final pathology revealed residual squamous cell carcinoma measuring 3.1 cm with close resection margin and metastatic carcinoma to lymph nodes at levels 10R, 12R and 4R. (ypT2a, yN2).  3) Concurrent chemoradiation with weekly carboplatin for AUC of 2 and paclitaxel 45 MG/M2. First dose 02/27/2016. Status post 5 cycles. Last dose was given 03/26/2016. 4) SBRT to left lower lobe lung nodule under the care of Dr. Sondra Come completed on 11/06/2016.  CURRENT THERAPY: Observation.  INTERVAL HISTORY: Tammy Ball 81 y.o. female returns to the clinic today for 70-month follow-up visit.  The patient is feeling fine today with no concerning complaints except for the baseline shortness of breath and she is currently on home oxygen secondary to COPD.  She denied having any current chest pain, cough or hemoptysis.  She has no nausea, vomiting, diarrhea or constipation.  She has no headache or visual changes.  She has no recent weight loss or night sweats.  The patient had repeat CT scan of the chest performed recently and she is here for evaluation and discussion of her scan results.  MEDICAL HISTORY: Past Medical History:  Diagnosis Date   Anemia    was while doing chemo   Anxiety    Asthma    Chemotherapy-induced neuropathy ([facility]) 11/01/2015    Claustrophobia    COPD (chronic obstructive pulmonary disease) ([facility])    Depression    Encounter for antineoplastic chemotherapy 02/09/2016   Glaucoma    Headache    prior to menopause   Heart murmur    History of echocardiogram    Echo 2/17: EF 62-83%, grade 1 diastolic dysfunction, mild MR, trivial pericardial effusion   History of nuclear stress test    Myoview 2/17: no ischemia or scar, EF 79%; low risk   History of radiation therapy 10/30/16-11/06/16   Hyperlipidemia    Hypertension    Insomnia 05/16/2016   Non-small cell carcinoma of right lung, stage 2 ([facility]) 10/03/2015   Radiation 02/29/16-04/10/16   50.4 Gy to right central chest   Renal cell carcinoma ([facility])    L nephrectomy  in 2012    ALLERGIES:  is allergic to bee venom, amlodipine, levofloxacin, alendronate, and hctz [hydrochlorothiazide].  MEDICATIONS:  Current Outpatient Medications  Medication Sig Dispense Refill   acetaminophen (TYLENOL) 500 MG tablet Take 500 mg by mouth every 6 (six) hours as needed for mild pain, moderate pain, fever or headache. Reported on 05/24/2016     albuterol (PROVENTIL) (2.5 MG/3ML) 0.083% nebulizer solution Take 3 mLs (2.5 mg total) by nebulization every 6 (six) hours as needed for wheezing or shortness of breath. 75 mL 6   albuterol (VENTOLIN HFA) 108 (90 Base) MCG/ACT inhaler Inhale 1-2 puffs into the lungs every 6 (six) hours as needed for wheezing or shortness of breath. 1 each 6   Ascorbic Acid (VITAMIN C) 1000 MG tablet Take 1,000 mg by mouth daily.  aspirin EC 81 MG tablet Take 81 mg by mouth daily.     atorvastatin (LIPITOR) 40 MG tablet Take 1 tablet (40 mg total) by mouth daily. 90 tablet 3   calcium carbonate (TUMS EX) 750 MG chewable tablet Chew 1 tablet by mouth daily as needed for heartburn.     Carboxymethylcellulose Sodium (ARTIFICIAL TEARS OP) Place 1 drop into both eyes daily as needed (dry eyes).     Cholecalciferol (VITAMIN D3 PO) Take 1,000 Units by mouth daily.      Guaifenesin (MUCINEX MAXIMUM STRENGTH) 1200 MG TB12 Take 1,200 mg by mouth 2 (two) times daily.     losartan (COZAAR) 100 MG tablet TAKE 1 TABLET BY MOUTH EVERY DAY 90 tablet 1   OMEGA-3 FATTY ACIDS PO Take 1,200 mg by mouth daily.     OXYGEN Inhale 2 L into the lungs continuous.     sodium chloride (OCEAN) 0.65 % SOLN nasal spray Place 1 spray into both nostrils daily.     traMADol (ULTRAM) 50 MG tablet Take 1 tablet (50 mg total) by mouth every 6 (six) hours as needed (BACK PAIN). 50 tablet 1   TRELEGY ELLIPTA 100-62.5-25 MCG/INH AEPB INHALE 1 PUFF BY MOUTH EVERY DAY 180 each 3   carvedilol (COREG) 12.5 MG tablet Take 1 tablet (12.5 mg total) by mouth 2 (two) times daily. 180 tablet 3   No current facility-administered medications for this visit.    SURGICAL HISTORY:  Past Surgical History:  Procedure Laterality Date   BACK SURGERY     cervical 1991   EYE SURGERY     IR GENERIC HISTORICAL  01/16/2017   IR RADIOLOGIST EVAL & MGMT 01/16/2017 [facility]   IR GENERIC HISTORICAL  01/31/2017   IR FLUORO GUIDED NEEDLE PLC ASPIRATION/INJECTION LOC 01/31/2017 Luanne Bras, MD [facility]   IR GENERIC HISTORICAL  01/31/2017   IR VERTEBROPLASTY CERV/THOR BX INC UNI/BIL INC/INJECT/IMAGING 01/31/2017 Luanne Bras, MD [facility]   IR GENERIC HISTORICAL  01/31/2017   IR VERTEBROPLASTY EA ADDL (T&LS) BX INC UNI/BIL INC INJECT/IMAGING 01/31/2017 Luanne Bras, MD [facility]   kidney cancer     NEPHRECTOMY     SPINE SURGERY     TUBAL LIGATION     VIDEO ASSISTED THORACOSCOPY (VATS)/WEDGE RESECTION Right 01/18/2016   Procedure: VIDEO ASSISTED THORACOSCOPY (VATS)/LUNG RESECTION, THOROCOTOMY, RIGHT UPPER LOBECTOMY, LYMPH NODE DISSECTION, PLACEMENT OF ON Q;  Surgeon: [name], MD;  Location: [facility];  Service: Thoracic;  Laterality: Right;   VIDEO BRONCHOSCOPY Bilateral 09/20/2015   Procedure: VIDEO BRONCHOSCOPY WITHOUT FLUORO;  Surgeon: Rigoberto Noel, MD;  Location: [facility];   Service: Cardiopulmonary;  Laterality: Bilateral;   VIDEO BRONCHOSCOPY N/A 01/18/2016   Procedure: VIDEO BRONCHOSCOPY;  Surgeon: [name], MD;  Location: [facility];  Service: Thoracic;  Laterality: N/A;    REVIEW OF SYSTEMS:  A comprehensive review of systems was negative except for: Constitutional: positive for fatigue Respiratory: positive for dyspnea on exertion   PHYSICAL EXAMINATION: General appearance: alert, cooperative and no distress Head: Normocephalic, without obvious abnormality, atraumatic Neck: no adenopathy, no JVD, supple, symmetrical, trachea midline and thyroid not enlarged, symmetric, no tenderness/mass/nodules Lymph nodes: Cervical, supraclavicular, and axillary nodes normal. Resp: clear to auscultation bilaterally Back: symmetric, no curvature. ROM normal. No CVA tenderness. Cardio: regular rate and rhythm, S1, S2 normal, no murmur, click, rub or gallop GI: soft, non-tender; bowel sounds normal; no masses,  no organomegaly Extremities: extremities normal, atraumatic, no cyanosis or edema  ECOG  PERFORMANCE STATUS: 1 - Symptomatic but completely ambulatory  Blood pressure (!) 153/87, pulse 82, temperature (!) 97.3 F (36.3 C), temperature source Tympanic, resp. rate 20, height 5\' 4"  (1.626 m), weight 183 lb 8 oz (83.2 kg), SpO2 90 %.  LABORATORY DATA: Lab Results  Component Value Date   WBC 6.3 12/08/2021   HGB 12.6 12/08/2021   HCT 39.8 12/08/2021   MCV 90.2 12/08/2021   PLT 357 12/08/2021      Chemistry      Component Value Date/Time   NA 138 12/08/2021 0901   NA 140 11/21/2018 1058   NA 140 11/11/2017 0939   K 4.3 12/08/2021 0901   K 4.6 11/11/2017 0939   CL 102 12/08/2021 0901   CO2 28 12/08/2021 0901   CO2 28 11/11/2017 0939   BUN 16 12/08/2021 0901   BUN 23 11/21/2018 1058   BUN 30.3 (H) 11/11/2017 0939   CREATININE 1.15 (H) 12/08/2021 0901   CREATININE 1.3 (H) 11/11/2017 0939   GLU 105 12/10/2009 0000      Component Value Date/Time    CALCIUM 9.3 12/08/2021 0901   CALCIUM 10.0 11/11/2017 0939   ALKPHOS 95 12/08/2021 0901   ALKPHOS 102 11/11/2017 0939   AST 17 12/08/2021 0901   AST 18 11/11/2017 0939   ALT 16 12/08/2021 0901   ALT 19 11/11/2017 0939   BILITOT 0.6 12/08/2021 0901   BILITOT 0.42 11/11/2017 0939       RADIOGRAPHIC STUDIES: CT Chest W Contrast  Result Date: 12/08/2021 CLINICAL DATA:  Primary Cancer Type: Lung Imaging Indication: Routine surveillance Interval therapy since last imaging? No Initial Cancer Diagnosis Date: 09/20/2015; Established by: Biopsy-proven Detailed Pathology: Stage IIIA non-small cell lung cancer, squamous cell carcinoma. Primary Tumor location:  Right upper lobe; left lower lobe nodule. Surgeries: Right upper lobectomy 01/18/2016. Vertebroplasty T3 and T4. Chemotherapy: Yes; Ongoing? No; Most recent administration: 03/26/2016 Immunotherapy? No Radiation therapy? Yes Date Range: 02/29/2016 - 04/10/2016; Target: Right central chest Date Range: 10/30/2016 - 11/06/2016; Target: Left lower lung Other Cancer Therapies: Left renal cell carcinoma; nephrectomy 2012. EXAM: CT CHEST WITH CONTRAST TECHNIQUE: Multidetector CT imaging of the chest was performed during intravenous contrast administration. RADIATION DOSE REDUCTION: This exam was performed according to the departmental dose-optimization program which includes automated exposure control, adjustment of the mA and/or kV according to patient size and/or use of iterative reconstruction technique. CONTRAST:  16mL OMNIPAQUE IOHEXOL 300 MG/ML  SOLN COMPARISON:  Most recent CT chest 06/05/2021. 10/03/2016 PET-CT. FINDINGS: Cardiovascular: Aortic atherosclerosis. Mild cardiomegaly, without pericardial effusion. Lad and left circumflex coronary artery calcification. No central pulmonary embolism, on this non-dedicated study. Pulmonary artery enlargement, outflow tract 3.5 cm. Mediastinum/Nodes: No supraclavicular adenopathy. No mediastinal or hilar  adenopathy. Lungs/Pleura: Similar trace right pleural fluid/thickening. Right upper lobectomy. Similar right perihilar radiation fibrosis. Mild to moderate centrilobular emphysema. Subpleural 3 mm left upper lobe pulmonary nodule on 36/7 is similar and likely benign. Anterior left upper lobe scarring. The radiation induced consolidation in the left lower lobe measures 2.6 x 2.1 cm on 118/7. Felt to be similar to 2.5 x 2.3 cm on the prior exam (when remeasured). Upper Abdomen: Normal imaged portions of the liver, spleen, stomach, pancreas, gallbladder, adrenal glands, right kidney. Left nephrectomy. Abdominal aortic atherosclerosis. Musculoskeletal: Osteopenia. Lower cervical spine fixation. Remote bilateral rib fractures. T3 and T4 vertebral augmentation. Compression deformities are severe T4 and mild-to-moderate at T6, similar. IMPRESSION: 1. Status post right upper lobectomy and right perihilar radiation fibrosis. No evidence of  recurrent or metastatic disease. 2. Similar left lower lobe radiation induced consolidation. 3. Pulmonary artery enlargement suggests pulmonary arterial hypertension. 4. Aortic Atherosclerosis (ICD10-I70.0) and Emphysema (ICD10-J43.9). Coronary artery atherosclerosis. Electronically Signed   By: Abigail Miyamoto M.D.   On: 12/08/2021 11:22     ASSESSMENT AND PLAN:  This is a very pleasant 81 years old white female with stage IIIA non-small cell lung cancer, squamous cell carcinoma status post neoadjuvant systemic chemotherapy was carboplatin and paclitaxel with partial response followed by right upper lobectomy and lymph node dissection. She was found to have evidence of metastatic disease to the mediastinal lymph nodes and close resection margin. The patient underwent a course of concurrent chemoradiation with weekly carboplatin and paclitaxel. She was also treated with SBRT left lower lobe pulmonary nodule under the care of Dr. Sondra Come. The patient has been on observation for more than 5  years.   The patient is feeling fine today with no concerning complaints except for the baseline shortness of breath secondary to COPD. She had repeat CT scan of the chest performed recently.  I personally and independently reviewed the scans and discussed the results with the patient today. Her scan showed no concerning findings for disease recurrence or metastasis. I recommended for her to continue on observation with repeat CT scan of the chest in 6 months. The patient will continue her routine follow-up visit and evaluation by pulmonary medicine for her COPD. For the hypertension, she will work with her primary care physician about adjusting her medication. The patient was advised to call immediately if she has any other concerning symptoms in the interval. The patient voices understanding of current disease status and treatment options and is in agreement with the current care plan. All questions were answered. The patient knows to call the clinic with any problems, questions or concerns. We can certainly see the patient much sooner if necessary.  Disclaimer: This note was dictated with voice recognition software. Similar sounding words can inadvertently be transcribed and may not be corrected upon review.

## 2022-01-01 ENCOUNTER — Telehealth: Payer: Self-pay

## 2022-01-01 NOTE — Chronic Care Management (AMB) (Signed)
Chronic Care Management Pharmacy Assistant   Name: Tammy Ball  MRN: [id number] DOB: 17-Jan-1941  Tammy Ball is an 81 y.o. year old female who presents for his follow-up CCM visit with the clinical pharmacist.  Reason for Encounter: Disease State   Conditions to be addressed/monitored: HTN   Recent office visits:  None ID  Recent consult visits:  12/25/21 Curt Bears, MD (Malignant neoplasm of unspecified part of unspecified bronchus or lung) Blood work ordered, no med [facility] visits:  None in previous 6 months  Medications: Outpatient Encounter Medications as of 01/01/2022  Medication Sig Note   acetaminophen (TYLENOL) 500 MG tablet Take 500 mg by mouth every 6 (six) hours as needed for mild pain, moderate pain, fever or headache. Reported on 05/24/2016    albuterol (PROVENTIL) (2.5 MG/3ML) 0.083% nebulizer solution Take 3 mLs (2.5 mg total) by nebulization every 6 (six) hours as needed for wheezing or shortness of breath.    albuterol (VENTOLIN HFA) 108 (90 Base) MCG/ACT inhaler Inhale 1-2 puffs into the lungs every 6 (six) hours as needed for wheezing or shortness of breath.    Ascorbic Acid (VITAMIN C) 1000 MG tablet Take 1,000 mg by mouth daily.    aspirin EC 81 MG tablet Take 81 mg by mouth daily.    atorvastatin (LIPITOR) 40 MG tablet Take 1 tablet (40 mg total) by mouth daily.    calcium carbonate (TUMS EX) 750 MG chewable tablet Chew 1 tablet by mouth daily as needed for heartburn.    Carboxymethylcellulose Sodium (ARTIFICIAL TEARS OP) Place 1 drop into both eyes daily as needed (dry eyes).    carvedilol (COREG) 12.5 MG tablet Take 1 tablet (12.5 mg total) by mouth 2 (two) times daily.    Cholecalciferol (VITAMIN D3 PO) Take 1,000 Units by mouth daily.    Guaifenesin (MUCINEX MAXIMUM STRENGTH) 1200 MG TB12 Take 1,200 mg by mouth 2 (two) times daily.    losartan (COZAAR) 100 MG tablet TAKE 1 TABLET BY MOUTH EVERY DAY    OMEGA-3 FATTY ACIDS PO Take  1,200 mg by mouth daily.    OXYGEN Inhale 2 L into the lungs continuous. 02/21/2021: use   sodium chloride (OCEAN) 0.65 % SOLN nasal spray Place 1 spray into both nostrils daily.    traMADol (ULTRAM) 50 MG tablet Take 1 tablet (50 mg total) by mouth every 6 (six) hours as needed (BACK PAIN).    TRELEGY ELLIPTA 100-62.5-25 MCG/INH AEPB INHALE 1 PUFF BY MOUTH EVERY DAY    Zinc Sulfate (ZINC 15 PO) Take by mouth.    No facility-administered encounter medications on file as of 01/01/2022.   Reviewed chart prior to disease state call. Spoke with patient regarding BP  Recent Office Vitals: BP Readings from Last 3 Encounters:  12/25/21 (!) 153/87  09/06/21 130/70  06/07/21 (!) 163/78   Pulse Readings from Last 3 Encounters:  12/25/21 82  09/06/21 71  06/07/21 71    Wt Readings from Last 3 Encounters:  12/25/21 183 lb 8 oz (83.2 kg)  09/06/21 183 lb (83 kg)  06/07/21 187 lb 3.2 oz (84.9 kg)     Kidney Function Lab Results  Component Value Date/Time   CREATININE 1.15 (H) 12/08/2021 09:01 AM   CREATININE 0.98 06/05/2021 10:51 AM   CREATININE 1.3 (H) 11/11/2017 09:39 AM   CREATININE 1.1 05/06/2017 08:32 AM   GFRNONAA 49 (L) 12/08/2021 09:01 AM   GFRNONAA 52 (L) 12/02/2013 09:11 AM   GFRAA 51 (  L) 05/30/2020 11:03 AM   GFRAA 60 12/02/2013 09:11 AM    BMP Latest Ref Rng & Units 12/08/2021 06/05/2021 12/02/2020  Glucose 70 - 99 mg/dL 100(H) 101(H) 101(H)  BUN 8 - 23 mg/dL 16 17 21   Creatinine 0.44 - 1.00 mg/dL 1.15(H) 0.98 1.13(H)  BUN/Creat Ratio 12 - 28 - - -  Sodium 135 - 145 mmol/L 138 140 137  Potassium 3.5 - 5.1 mmol/L 4.3 4.7 5.3(H)  Chloride 98 - 111 mmol/L 102 104 100  CO2 22 - 32 mmol/L 28 28 30   Calcium 8.9 - 10.3 mg/dL 9.3 10.0 9.7    Current antihypertensive regimen:  Losartan 100 mg Carvedilol 12.5 mg  How often are you checking your Blood Pressure? Patient states that she checks her blood pressure daily. Patient states that she has pain in her back which may cause  her bp to elevate and also she had an upper respiratory infection recently that may have caused it to be high  Current home BP readings: Patient states that her last home reading have been 135/72 on 12/31/21,164/84 on 2/18 and 120/73 on 12/29/21  What recent interventions/DTPs have been made by any provider to improve Blood Pressure control since last CPP Visit: None noted  Any recent hospitalizations or ED visits since last visit with CPP? No  What diet changes have been made to improve Blood Pressure Control?  Patient states that she has not made any new changes to her diet, watches her salt intake, no fried foods, more fruits and fresh vegetables  What exercise is being done to improve your Blood Pressure Control?  Patient states that she does yoga, and walks in her house for 5 mins in the morning and 5 mins in the afternoon. She states that she has neuropathy in both feet which makes it hard to walk at times  Adherence Review: Is the patient currently on ACE/ARB medication? Yes Does the patient have >5 day gap between last estimated fill dates? No   Care Gaps: Colonoscopy-NA Diabetic Foot Exam-NA Mammogram-NA Ophthalmology-NA Dexa Scan - NA Annual Well Visit - NA Micro albumin-NA Hemoglobin A1c-NA   Star Rating Drugs: Losartan 100 mg-last fill 10/24/21 90 ds Atorvastatin 40 mg-last fill 10/30/21 90 ds   Ethelene Hal Clinical Pharmacist Assistant [phone]

## 2022-01-09 ENCOUNTER — Other Ambulatory Visit: Payer: Self-pay

## 2022-01-09 ENCOUNTER — Encounter: Payer: Self-pay | Admitting: Emergency Medicine

## 2022-01-09 ENCOUNTER — Ambulatory Visit (INDEPENDENT_AMBULATORY_CARE_PROVIDER_SITE_OTHER): Payer: Medicare Other | Admitting: Emergency Medicine

## 2022-01-09 DIAGNOSIS — J449 Chronic obstructive pulmonary disease, unspecified: Secondary | ICD-10-CM

## 2022-01-09 DIAGNOSIS — J9611 Chronic respiratory failure with hypoxia: Secondary | ICD-10-CM | POA: Diagnosis not present

## 2022-01-09 DIAGNOSIS — C3491 Malignant neoplasm of unspecified part of right bronchus or lung: Secondary | ICD-10-CM

## 2022-01-09 MED ORDER — ALBUTEROL SULFATE (2.5 MG/3ML) 0.083% IN NEBU: 2.5000 mg | INHALATION_SOLUTION | Freq: Four times a day (QID) | RESPIRATORY_TRACT | 6 refills | Status: DC | PRN

## 2022-01-09 MED ORDER — TRELEGY ELLIPTA 100-62.5-25 MCG/ACT IN AEPB: 1.0000 | INHALATION_SPRAY | Freq: Every day | RESPIRATORY_TRACT | 1 refills | Status: DC

## 2022-01-09 NOTE — Assessment & Plan Note (Signed)
We will plan to continue Trelegy 1 elation once daily.  Rinse and gargle after using. Keep your albuterol available to use either 1 nebulizer treatment or 2 puffs when you needed for shortness of breath, chest tightness, wheezing. Flu shot and COVID-19 shots are up-to-date Follow with Dr. Lamonte Sakai in 12 months or sooner if you have any problems.

## 2022-01-09 NOTE — Assessment & Plan Note (Signed)
Continue to get your repeat CT scans of the chest and follow-up with Dr. Julien Nordmann as planned.

## 2022-01-09 NOTE — Assessment & Plan Note (Signed)
Continue to wear your oxygen at 2 L/min when you exert yourself.  We will perform an overnight oximetry on room air to see if you need to continue using your oxygen at night while you are sleeping.

## 2022-01-09 NOTE — Addendum Note (Signed)
Addended by: Gavin Potters R on: 01/09/2022 10:57 AM   Modules accepted: Orders

## 2022-01-09 NOTE — Progress Notes (Signed)
Subjective:    Patient ID: Tammy Ball, female    DOB: 1941/09/28, 81 y.o.   MRN: [id number]   HPI  ROV 01/09/22 --follow-up visit for 81 year old woman with a history of COPD, squamous cell lung cancer 2016 for which she underwent right upper lobe lobectomy and chemoradiation, then SBRT to a left lower lobe pulmonary nodule 10/2016.  Past medical history also significant for hypertension with diastolic dysfunction, renal cell carcinoma. She is been managed on Trelegy, oxygen at 2 L/min.  She reports that she has had 2 flares since last year - treated after URI's, received abx without steroids. She feels close to baseline - still has exertional SOB, uses her O2 w exertion and at night. Her cough is better from recent AE. Some wheeze at night. Using albuterol only when flaring - otherwise rarely.  She is able to shop, do housework. She is still working, documentation review.   CT chest 12/08/2021 reviewed by me shows no evidence of recurrence.  There was pulmonary arterial enlargement suggestive of PAH.  Echocardiogram 01/09/2016 with normal LV size and function, LVEF 60-65% with grade 1 diastolic dysfunction.  Normal RV size and function.   Review of Systems  Constitutional:  Negative for fever and unexpected weight change.  HENT:  Negative for congestion, dental problem, ear pain, nosebleeds, postnasal drip, rhinorrhea, sinus pressure, sneezing, sore throat and trouble swallowing.   Eyes:  Negative for redness and itching.  Respiratory:  Positive for shortness of breath and wheezing. Negative for cough and chest tightness.   Cardiovascular:  Negative for palpitations and leg swelling.  Gastrointestinal:  Negative for nausea and vomiting.  Genitourinary:  Negative for dysuria.  Musculoskeletal:  Negative for joint swelling.  Skin:  Negative for rash.  Allergic/Immunologic: Negative.  Negative for environmental allergies, food allergies and immunocompromised state.  Neurological:  Negative  for headaches.  Hematological:  Does not bruise/bleed easily.  Psychiatric/Behavioral:  Negative for dysphoric mood. The patient is nervous/anxious.     Objective:   Physical Exam Vitals:   01/09/22 0939  BP: 118/76  Pulse: 69  Temp: 98.6 F (37 C)  TempSrc: Oral  SpO2: 96%  Weight: 182 lb 6.4 oz (82.7 kg)  Height: 5\' 4"  (1.626 m)   Gen: Pleasant, elderly woman, in no distress,  normal affect  ENT: No lesions,  mouth clear,  oropharynx clear, no postnasal drip  Neck: No JVD, no stridor  Lungs: No use of accessory muscles, no crackles or wheezing on normal respiration, good air movement on forced exp as well  Cardiovascular: RRR, early systolic M  Musculoskeletal: No deformities, no cyanosis or clubbing  Neuro: alert, awake, non focal  Skin: Warm, no lesions or rash      Assessment & Plan:  COPD GOLD B-C We will plan to continue Trelegy 1 elation once daily.  Rinse and gargle after using. Keep your albuterol available to use either 1 nebulizer treatment or 2 puffs when you needed for shortness of breath, chest tightness, wheezing. Flu shot and COVID-19 shots are up-to-date Follow with Dr. Lamonte Sakai in 12 months or sooner if you have any problems.   Non-small cell carcinoma of right lung, stage 2 (HCC) Continue to get your repeat CT scans of the chest and follow-up with Dr. Julien Nordmann as planned.  Chronic respiratory failure with hypoxia (HCC) Continue to wear your oxygen at 2 L/min when you exert yourself.  We will perform an overnight oximetry on room air to see if you need to  continue using your oxygen at night while you are sleeping.   Baltazar Apo, MD, PhD 01/09/2022, 10:08 AM [facility] [phone] or if no answer [phone]

## 2022-01-09 NOTE — Patient Instructions (Addendum)
We will plan to continue Trelegy 1 elation once daily.  Rinse and gargle after using. Keep your albuterol available to use either 1 nebulizer treatment or 2 puffs when you needed for shortness of breath, chest tightness, wheezing. Flu shot and COVID-19 shots are up-to-date Continue to wear your oxygen at 2 L/min when you exert yourself.  We will perform an overnight oximetry on room air to see if you need to continue using your oxygen at night while you are sleeping. Continue to get your repeat CT scans of the chest and follow-up with Dr. Julien Nordmann as planned. Follow with Dr. Lamonte Sakai in 12 months or sooner if you have any problems.

## 2022-01-09 NOTE — Addendum Note (Signed)
Addended by: Gavin Potters R on: 01/09/2022 10:58 AM   Modules accepted: Orders

## 2022-02-26 ENCOUNTER — Telehealth: Payer: Self-pay

## 2022-02-26 DIAGNOSIS — Z20822 Contact with and (suspected) exposure to covid-19: Secondary | ICD-10-CM | POA: Diagnosis not present

## 2022-02-26 NOTE — Progress Notes (Signed)
? ? ?Chronic Care Management ?Pharmacy Assistant  ? ?Name: Tammy Ball  MRN: [id number] DOB: 1941/03/30 ? ? ?Reason for Encounter: Disease State-Adherence  ?  ? ?Recent office visits:  ?None ID ? ?Recent consult visits:  ?01/09/22 Collene Gobble, MD-Pulmonary Disease (COPD) No orders, or med changes ? ?Hospital visits:  ?None in previous 6 months ? ?Medications: ?Outpatient Encounter Medications as of 02/26/2022  ?Medication Sig Note  ? acetaminophen (TYLENOL) 500 MG tablet Take 500 mg by mouth every 6 (six) hours as needed for mild pain, moderate pain, fever or headache. Reported on 05/24/2016   ? albuterol (PROVENTIL) (2.5 MG/3ML) 0.083% nebulizer solution Take 3 mLs (2.5 mg total) by nebulization every 6 (six) hours as needed for wheezing or shortness of breath.   ? albuterol (VENTOLIN HFA) 108 (90 Base) MCG/ACT inhaler Inhale 1-2 puffs into the lungs every 6 (six) hours as needed for wheezing or shortness of breath.   ? Ascorbic Acid (VITAMIN C) 1000 MG tablet Take 1,000 mg by mouth daily.   ? aspirin EC 81 MG tablet Take 81 mg by mouth daily.   ? atorvastatin (LIPITOR) 40 MG tablet Take 1 tablet (40 mg total) by mouth daily.   ? calcium carbonate (TUMS EX) 750 MG chewable tablet Chew 1 tablet by mouth daily as needed for heartburn.   ? Carboxymethylcellulose Sodium (ARTIFICIAL TEARS OP) Place 1 drop into both eyes daily as needed (dry eyes).   ? carvedilol (COREG) 12.5 MG tablet Take 1 tablet (12.5 mg total) by mouth 2 (two) times daily.   ? Cholecalciferol (VITAMIN D3 PO) Take 1,000 Units by mouth daily.   ? Fluticasone-Umeclidin-Vilant (TRELEGY ELLIPTA) 100-62.5-25 MCG/ACT AEPB Inhale 1 puff into the lungs daily.   ? Guaifenesin (MUCINEX MAXIMUM STRENGTH) 1200 MG TB12 Take 1,200 mg by mouth 2 (two) times daily.   ? losartan (COZAAR) 100 MG tablet TAKE 1 TABLET BY MOUTH EVERY DAY   ? OMEGA-3 FATTY ACIDS PO Take 1,200 mg by mouth daily.   ? OXYGEN Inhale 2 L into the lungs continuous. 02/21/2021: use  ? sodium  chloride (OCEAN) 0.65 % SOLN nasal spray Place 1 spray into both nostrils daily.   ? traMADol (ULTRAM) 50 MG tablet Take 1 tablet (50 mg total) by mouth every 6 (six) hours as needed (BACK PAIN).   ? Zinc Sulfate (ZINC 15 PO) Take by mouth.   ? ?No facility-administered encounter medications on file as of 02/26/2022.  ? ?Have you had any problems recently with your health?Patient states that the only health issues that she is dealing with is the back and leg pain and right shoulder pain. She states that she has had the issues for years now. She did mention that she believes it is time for her to get her lipid panel done since it has been about 2 yrs since she last had it done ? ?Have you had any problems with your pharmacy?Patient states that she does not have any problems with getting medications from the pharmacy. She did state that she would like to try and get patient assistance for Trelegy which she gets from Dr. Lamonte Sakai ? ?What issues or side effects are you having with your medications?Patient states hat she does not have any side effects from medications ? ?What would you like me to pass along to [facility] for them to help you with? Patient states that she is doing ok ? ?What can we do to take care of you better? Patient states  that she does not need anything at this time ? ?Care Gaps: ?Colonoscopy-NA ?Diabetic Foot Exam-NA ?Mammogram-NA ?Ophthalmology-NA ?Dexa Scan - NA ?Annual Well Visit - NA ?Micro albumin-NA ?Hemoglobin A1c-NA ?  ?Star Rating Drugs: ?Losartan 100 mg-last fill 01/20/22 90 ds ?Atorvastatin 40 mg-last fill 01/29/22 90 ds ?  ?Ethelene Hal ?Clinical Pharmacist Assistant ?[phone]  ? ?

## 2022-04-02 ENCOUNTER — Ambulatory Visit (INDEPENDENT_AMBULATORY_CARE_PROVIDER_SITE_OTHER): Payer: Medicare Other

## 2022-04-02 DIAGNOSIS — I10 Essential (primary) hypertension: Secondary | ICD-10-CM

## 2022-04-02 DIAGNOSIS — E782 Mixed hyperlipidemia: Secondary | ICD-10-CM

## 2022-04-02 DIAGNOSIS — J449 Chronic obstructive pulmonary disease, unspecified: Secondary | ICD-10-CM

## 2022-04-02 DIAGNOSIS — I5032 Chronic diastolic (congestive) heart failure: Secondary | ICD-10-CM

## 2022-04-02 NOTE — Progress Notes (Signed)
Chronic Care Management Pharmacy Note  04/02/2022 Name:  Tammy Ball MRN:  [id number] DOB:  1941-02-05  Summary: -Pt endorses compliance to current medications  -COPD well controlled on trelegy - uses albuterol inhaler / nebulizer <1 time per month  -BP well controlled at home, typically averaging 127-135/70-80 -Reports that pain levels have been controlled on tramadol and acetaminophen use - using each medication infrequently  -LDL well controlled and at goal, patient denies any issues with medications  Recommendations/Changes made from today's visit: -Recommending no changes to medications at this time -Pt to continue monitoring BP at least once weekly, to reach out should BP average >140/90 -discussed prolia injections due to high FRAX score, patient declined at this time - will reach out should she decide she would like to proceed with prolia  Subjective: Tammy Ball is an 81 y.o. year old female who is a primary patient of [name], Ines Bloomer, MD.  The CCM team was consulted for assistance with disease management and care coordination needs.    Engaged with patient by telephone for follow up visit in response to provider referral for pharmacy case management and/or care coordination services.   Consent to Services:  The patient was given the following information about Chronic Care Management services today, agreed to services, and gave verbal consent: 1. CCM service includes personalized support from designated clinical staff supervised by the primary care provider, including individualized plan of care and coordination with other care providers 2. 24/7 contact phone numbers for assistance for urgent and routine care needs. 3. Service will only be billed when office clinical staff spend 20 minutes or more in a month to coordinate care. 4. Only one practitioner may furnish and bill the service in a calendar month. 5.The patient may stop CCM services at any time (effective at the  end of the month) by phone call to the office staff. 6. The patient will be responsible for cost sharing (co-pay) of up to 20% of the service fee (after annual deductible is met). Patient agreed to services and consent obtained.  Patient Care Team: Horald Pollen, MD as PCP - General (Internal Medicine) Curt Bears, MD as Consulting Physician (Oncology) [name], MD (Inactive) as Consulting Physician (Cardiothoracic Surgery) Calvert Cantor, MD as Consulting Physician (Ophthalmology) Delice Bison Darnelle Maffucci, [facility] as Pharmacist (Pharmacist)  Recent office visits:  09/06/2021 - Dr. [name] - no changes to medications    Recent consult visits:  01/09/2022 - Dr. Lamonte Sakai - Pulmonology - continue current medications - repeat CT scan of chest - f/u with oncology as planned  12/25/2021 - Dr. Julien Nordmann  - Oncology - no changes to medications -repeat CT scan of chest in 6 months   Hospital visits:  None in previous 6 months  Objective:  Lab Results  Component Value Date   CREATININE 1.15 (H) 12/08/2021   BUN 16 12/08/2021   GFRNONAA 49 (L) 12/08/2021   GFRAA 51 (L) 05/30/2020   NA 138 12/08/2021   K 4.3 12/08/2021   CALCIUM 9.3 12/08/2021   CO2 28 12/08/2021   GLUCOSE 100 (H) 12/08/2021    No results found for: HGBA1C, FRUCTOSAMINE, GFR, MICROALBUR  Last diabetic Eye exam:  No results found for: HMDIABEYEEXA  Last diabetic Foot exam:  No results found for: HMDIABFOOTEX   Lab Results  Component Value Date   CHOL 169 05/30/2020   HDL 89 05/30/2020   LDLCALC 68 05/30/2020   TRIG 61 05/30/2020   CHOLHDL 1.9 05/30/2020  Latest Ref Rng & Units 12/08/2021    9:01 AM 06/05/2021   10:51 AM 12/02/2020    9:15 AM  Hepatic Function  Total Protein 6.5 - 8.1 g/dL 6.9   7.0   6.8    Albumin 3.5 - 5.0 g/dL 4.1   3.9   3.9    AST 15 - 41 U/L _0 ALT 0 - 44 U/L _1 Alk Phosphatase 38 - 126 U/L 95   98   81    Total Bilirubin 0.3 - 1.2 mg/dL 0.6   0.7    0.7      Lab Results  Component Value Date/Time   TSH 1.320 11/21/2018 10:58 AM   TSH 1.88 10/02/2016 02:14 PM   FREET4 1.31 12/14/2011 01:43 PM   FREET4 1.13 04/21/2011 12:05 PM       Latest Ref Rng & Units 12/08/2021    9:01 AM 06/05/2021   10:51 AM 12/02/2020    9:15 AM  CBC  WBC 4.0 - 10.5 K/uL 6.3   7.4   6.7    Hemoglobin 12.0 - 15.0 g/dL 12.6   12.9   12.0    Hematocrit 36.0 - 46.0 % 39.8   39.1   36.5    Platelets 150 - 400 K/uL 357   248   192      No results found for: VD25OH  Clinical ASCVD: No  The 10-year ASCVD risk score (Arnett DK, et al., 2019) is: 34.1%   Values used to calculate the score:     Age: 51 years     Sex: Female     Is Non-Hispanic African American: No     Diabetic: No     Tobacco smoker: Yes     Systolic Blood Pressure: 889 mmHg     Is BP treated: Yes     HDL Cholesterol: 89 mg/dL     Total Cholesterol: 169 mg/dL       09/06/2021    9:42 AM 05/18/2021    1:48 PM 02/21/2021   10:53 AM  Depression screen PHQ 2/9  Decreased Interest 0 1 1  Down, Depressed, Hopeless 0 1 2  PHQ - 2 Score 0 2 3  Altered sleeping   2  Tired, decreased energy   2  Change in appetite   0  Feeling bad or failure about yourself    0  Trouble concentrating   0  Moving slowly or fidgety/restless   0  Suicidal thoughts   0  PHQ-9 Score   7     Social History   Tobacco Use  Smoking Status Former   Packs/day: 1.00   Years: 50.00   Pack years: 50.00   Types: Cigarettes   Quit date: 03/16/2011   Years since quitting: 11.0  Smokeless Tobacco Never  Tobacco Comments   last 4-5 years of smoking, smoked 0.5 pack/day    BP Readings from Last 3 Encounters:  01/09/22 118/76  12/25/21 (!) 153/87  09/06/21 130/70   Pulse Readings from Last 3 Encounters:  01/09/22 69  12/25/21 82  09/06/21 71   Wt Readings from Last 3 Encounters:  01/09/22 182 lb 6.4 oz (82.7 kg)  12/25/21 183 lb 8 oz (83.2 kg)  09/06/21 183 lb (83 kg)   BMI Readings from Last 3  Encounters:  01/09/22 31.31 kg/m  12/25/21 31.50 kg/m  09/06/21 31.41 kg/m  Assessment/Interventions: Review of patient past medical history, allergies, medications, health status, including review of consultants reports, laboratory and other test data, was performed as part of comprehensive evaluation and provision of chronic care management services.   SDOH:  (Social Determinants of Health) assessments and interventions performed: Yes  SDOH Screenings   Alcohol Screen: Low Risk    Last Alcohol Screening Score (AUDIT): 0  Depression (PHQ2-9): Low Risk    PHQ-2 Score: 0  Financial Resource Strain: Not on file  Food Insecurity: Not on file  Housing: Low Risk    Last Housing Risk Score: 0  Physical Activity: Sufficiently Active   Days of Exercise per Week: 5 days   Minutes of Exercise per Session: 30 min  Social Connections: Moderately Integrated   Frequency of Communication with Friends and Family: More than three times a week   Frequency of Social Gatherings with Friends and Family: More than three times a week   Attends Religious Services: More than 4 times per year   Active Member of [employer] or Organizations: No   Attends Music therapist: More than 4 times per year   Marital Status: Divorced  Stress: No Stress Concern Present   Feeling of Stress : Not at all  Tobacco Use: Medium Risk   Smoking Tobacco Use: Former   Smokeless Tobacco Use: Never   Passive Exposure: Not on Pensions consultant Needs: No Transportation Needs   Lack of Transportation (Medical): No   Lack of Transportation (Non-Medical): No    CCM Care Plan  Allergies  Allergen Reactions   Bee Venom Anaphylaxis, Shortness Of Breath, Swelling and Other (See Comments)    Swelling at site    Amlodipine Swelling and Other (See Comments)    Swelling of the ankles and hands    Levofloxacin Other (See Comments)    Joint pain    Alendronate     Joint pains / hypercalcemia    Hctz  [Hydrochlorothiazide] Palpitations and Other (See Comments)    Sweating     Medications Reviewed Today     Reviewed by Tomasa Blase, [facility] (Pharmacist) on 04/02/22 at [place] List Status: <None>   Medication Order Taking? Sig Documenting Provider Last Dose Status Informant  acetaminophen (TYLENOL) 500 MG tablet [id number] Yes Take 500 mg by mouth every 6 (six) hours as needed for mild pain, moderate pain, fever or headache. Reported on 05/24/2016 [provider] Taking Active Self  albuterol (PROVENTIL) (2.5 MG/3ML) 0.083% nebulizer solution [id number] Yes Take 3 mLs (2.5 mg total) by nebulization every 6 (six) hours as needed for wheezing or shortness of breath. Collene Gobble, MD Taking Active   albuterol (VENTOLIN HFA) 108 (90 Base) MCG/ACT inhaler [id number] Yes Inhale 1-2 puffs into the lungs every 6 (six) hours as needed for wheezing or shortness of breath. Horald Pollen, MD Taking Active   Ascorbic Acid (VITAMIN C) 1000 MG tablet [id number] Yes Take 1,000 mg by mouth daily. [provider] Taking Active Self  aspirin EC 81 MG tablet [id number] Yes Take 81 mg by mouth daily. [provider] Taking Active Self           Med Note Tedra Senegal D   Wed Jan 07, 2019  1:56 PM)    atorvastatin (LIPITOR) 40 MG tablet [id number] Yes Take 1 tablet (40 mg total) by mouth daily. Horald Pollen, MD Taking Active   calcium carbonate (TUMS EX) 750 MG chewable tablet [id number] Yes Chew 1 tablet by  mouth daily as needed for heartburn. [provider] Taking Active Self  Carboxymethylcellulose Sodium (ARTIFICIAL TEARS OP) [id number] Yes Place 1 drop into both eyes daily as needed (dry eyes). [provider] Taking Active Self  carvedilol (COREG) 12.5 MG tablet [id number]  Take 1 tablet (12.5 mg total) by mouth 2 (two) times daily. Horald Pollen, MD  Expired 12/05/21 2359   Cholecalciferol (VITAMIN D3 PO) [id number] Yes Take 1,000 Units by mouth  daily. [provider] Taking Active Self  Fluticasone-Umeclidin-Vilant (TRELEGY ELLIPTA) 100-62.5-25 MCG/ACT AEPB [id number] Yes Inhale 1 puff into the lungs daily. Collene Gobble, MD Taking Active   Guaifenesin [facility] MAXIMUM STRENGTH) 1200 MG TB12 [id number] Yes Take 1,200 mg by mouth 2 (two) times daily. [provider] Taking Active Self  losartan (COZAAR) 100 MG tablet [id number] Yes TAKE 1 TABLET BY MOUTH EVERY DAY Horald Pollen, MD Taking Active   OMEGA-3 FATTY ACIDS PO [id number] Yes Take 1,200 mg by mouth daily. [provider] Taking Active Self  OXYGEN [id number] Yes Inhale 2 L into the lungs continuous. [provider] Taking Active Self           Med Note Blanch Media, CYNTHIA A   Tue Feb 21, 2021 10:14 AM) use  sodium chloride (OCEAN) 0.65 % SOLN nasal spray [id number] Yes Place 1 spray into both nostrils daily. [provider] Taking Active Self  traMADol (ULTRAM) 50 MG tablet [id number] Yes Take 1 tablet (50 mg total) by mouth every 6 (six) hours as needed (BACK PAIN). Horald Pollen, MD Taking Active   Zinc Sulfate (ZINC 15 PO) [id number] Yes Take by mouth. 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   Chronic diastolic CHF (congestive heart failure) (HCC)    CKD (chronic kidney disease) stage 3, GFR 30-59 ml/min ([place]) 11/10/2020   Allergic rhinitis 01/06/2019   Chronic respiratory failure with hypoxia ([place]) 12/26/2018   Age-related osteoporosis with current pathological fracture 11/21/2018   Renal insufficiency 05/20/2018   History of  compression fracture of spine 12/10/2016   Hypercalcemia 06/25/2016   Insomnia 05/16/2016   Pneumonitis, radiation ([place]) 04/11/2016   S/P lobectomy of lung 01/18/2016   Chemotherapy-induced neuropathy ([place]) 11/01/2015   Non-small cell carcinoma of right lung, stage 2 ([place]) 10/03/2015   Hyperlipidemia 08/14/2013   Spondylolisthesis of lumbar region 07/03/2013   Essential hypertension 12/17/2011   COPD GOLD B-C 12/17/2011   Renal cell cancer ([place]) 12/17/2011   Sciatica of left side 12/17/2011   Depression 12/17/2011    Immunization History  Administered Date(s) Administered   Fluad Quad(high Dose 65+) 07/18/2019   Hepatitis B 11/12/1998   Influenza Split 09/12/2012   Influenza, High Dose Seasonal PF 08/23/2017, 08/23/2018   Influenza,inj,Quad PF,6+ Mos 07/18/2013   Influenza-Unspecified 07/29/2014, 07/23/2015, 08/25/2016, 07/28/2020, 08/21/2021   PFIZER(Purple Top)SARS-COV-2 Vaccination 12/24/2019, 01/18/2020, 05/07/2020, 03/23/2021, 08/21/2021   Pneumococcal Conjugate-13 07/29/2015   Pneumococcal Polysaccharide-23 11/12/2000, 10/23/2012, 08/23/2017   Pneumococcal-Unspecified 10/23/2012, 08/23/2017   Tdap 12/02/2013   Zoster, Live 12/13/2010    Conditions to be addressed/monitored:  Hypertension, Hyperlipidemia, Heart Failure, COPD, Chronic Kidney Disease, Osteopenia,  Allergic Rhinitis, and Chronic Pain  Care Plan : CCM Care Plan  Updates made by Tomasa Blase, RPH since 04/02/2022 12:00 AM     Problem: HTN, HF, HLD, CKD, COPD, Chronic Pain, Osteopenia   Priority: High  Onset Date: 06/26/2021     Long-Range Goal: Disease Management   Start Date: 06/26/2021  Expected End Date: 04/03/2023  This Visit's Progress: On track  Recent Progress: On track  Priority: High  Note:   Current Barriers:  Unable to independently monitor therapeutic efficacy  Pharmacist Clinical Goal(s):  Patient will achieve adherence to monitoring guidelines and medication adherence to achieve  therapeutic efficacy maintain control of LDL, BP, Pain, CKD, and COPD as evidenced by next lipid panel, next GFR, pain levels, and frequency of rescue inhaler use  through collaboration with PharmD and provider.   Interventions: 1:1 collaboration with Horald Pollen, MD regarding development and update of comprehensive plan of care as evidenced by provider attestation and co-signature Inter-disciplinary care team collaboration (see longitudinal plan of care) Comprehensive medication review performed; medication list updated in electronic medical record  Hyperlipidemia: (LDL goal < 100) -Controlled Lab Results  Component Value Date   [place] 68 05/30/2020  -Current treatment: Atorvastatin 37m - 1 tablet daily  Aspirin 868m- 1 tablet daily  Fish Oils 100049m 1 capsule daily  -Medications previously tried: n/a  -Current dietary patterns: reports to eating a diet that is low in high cholesterol foods -Current exercise habits: reports to daily yoga / banded workouts -Educated on Cholesterol goals;  Benefits of statin for ASCVD risk reduction; Importance of limiting foods high in cholesterol; -Counseled on diet and exercise extensively Recommended to continue current medication  Heart Failure (Goal: manage symptoms and prevent exacerbations)/ Hypertension (BP goal <140/90) -Controlled - improved -Last ejection fraction: 60-65% (Date: 12/2015) -HF type: Diastolic -NYHA Class: II-IIb -Current treatment: Carvedilol 12.5mg56m1 tablet twice daily  Losartan 100mg4m tablet daily -Medications previously tried: amlodipine (edema), benazepril (cough), hctz (sweating), metoprolol succinate, valsartan   -Current home BP/HR readings: 127-135/70-80 -Current dietary habits: reports to following a low sodium diet -Current exercise habits: yoga exercises / band workouts  - working out daily  -Educated on Benefits of medications for managing symptoms and prolonging life Importance of  weighing daily; if you gain more than 3 pounds in one day or 5 pounds in one week, reach out to office Importance of blood pressure control -Recommended for patient to continue current medications, continue to monitor blood pressure at least once weekly  COPD (Goal: control symptoms and prevent exacerbations) -Controlled -Current treatment  Trelegy ellipta 100-62.5-25mcg/act - 1 puff daily  Albuterol 0.083% nebulizer solution - inhale 1 vial every 6 hours as needed  Albuterol 108mcg7m - 1-2 puffs every 6 hours as needed  Guaifenesin 1200mg -72mablet twice daily  -Medications previously tried: Advair, dulera, duoneb, spiriva  -Gold Grade: Gold 2 (FEV1 50-79%) -Current COPD Classification:  B-C -Exacerbations requiring treatment in last 6 months: none  -Patient reports consistent use of maintenance inhaler -Frequency of rescue inhaler use: on average ~once a month -Counseled on Proper inhaler technique; Benefits of consistent maintenance inhaler use When to use rescue inhaler Differences between maintenance and rescue inhalers -Recommended to continue current medication  Osteopenia (Goal Prevention of facture) -Not ideally controlled -Last DEXA Scan: 05/16/2018   T-Score left femoral neck: -1.1  T-Score total left femur: -1.6  T-Score right femoral neck: -1.6  T-Score total right femur: -1.8  T-Score  lumbar spine: -0.4  10-year probability of major osteoporotic fracture: 16.6%  10-year probability of hip fracture: 5.0% -Patient is a candidate for pharmacologic treatment due to T-Score -1.0 to -2.5 and 10-year risk of hip fracture > 3% -Current treatment  Vitamin D3 1000 units - 1 tablet daily  -Medications previously tried: alendronate  - stopped after 2 months due to joint pain  -Recommend [id number] units of vitamin D daily. Recommend 1200 mg of calcium daily from dietary and supplemental sources.  -discussed with patient about risk of possible hip fracture and recommendation to  trial ibandronate which she declined at this time, was not interested in prolia injections either -Recommended to continue current medications  Chronic Pain - Low Back (Goal: Pain control) -Controlled -Current treatment  Acetaminophen 567m - 1 tablet every 6 hours as needed  Tramadol 524m- 1 tablet every 6 hours as needed (taking 1-2 tablets a month)  -Medications previously tried: oxycodone, ibuprofen, lidocaine patches  -Recommended to continue current medications   Chronic Kidney Disease (Goal: Prevention of disease progression) -Controlled - stable -Last eGFR - 49 mL/min  - Advised for patient to avoid NSAID use, and encouraged for BP monitoring to ensure goal BP to prevent worsening kidney function   Health Maintenance -Vaccine gaps: Shingles vaccine, influenza vaccine -Current therapy:  Sodium Chloride 0.65% nasal spray - 1 spray into both nostrils daily  Artificial Tears - 1 drop into both eyes daily as needed  Vitamin C 100056m 1 tablet daily  Calcium Carbonate 750m96m1 tablet daily as needed  -Educated on Cost vs benefit of each product must be carefully weighed by individual consumer -Patient is satisfied with current therapy and denies issues -Recommended to continue current medication  Patient Goals/Self-Care Activities Patient will:  - take medications as prescribed check blood pressure at least once weekly, document, and provide at future appointments  Follow Up Plan: Telephone follow up appointment with care management team member scheduled for: 6 months The patient has been provided with contact information for the care management team and has been advised to call with any health related questions or concerns.      Medication Assistance: Trelegy obtained through [employer] assistance program.   Patient's preferred pharmacy is:  [id number] [place] - [id number]:  [phone] pill box? Yes Pt endorses 100% compliance  Care Plan and Follow Up Patient Decision:  Patient agrees to Care Plan and Follow-up.  Plan: Telephone follow up appointment with care management team member scheduled for: 6 months and The patient has been provided with contact information for the care management team and has been advised to call with any health related questions or concerns.   DanieTomasa BlasermD Clinical Pharmacist, [place]

## 2022-04-02 NOTE — Patient Instructions (Signed)
Visit Information  Following are the goals we discussed today:    Prevent Falls and Broken Bones   Timeframe:  Long-Range Goal Priority:  High Start Date:   06/26/2021                          Expected End Date:  03/2023                    Follow Up Date 09/2022   - always use handrails on the stairs - always wear shoes or slippers with non-slip sole - get at least 10 minutes of activity every day - keep cell phone with me always - make an emergency alert plan in case I fall - pick up clutter from the floors - remove, or use a non-slip pad, with my throw rugs - wear low heeled or flat shoes with non-skid soles    Why is this important?   When you fall, there are 3 things that control if a bone breaks or not.  These are the fall itself, how hard and the direction that you fall and how fragile your bones are.  Preventing falls is very important for you because of fragile bones.    Track and Manage My Blood Pressure   Timeframe:  Long-Range Goal Priority:  Medium Start Date:    04/02/2022                      Expected End Date:  04/03/2023                      Follow Up Date 11/20203   - check blood pressure weekly - choose a place to take my blood pressure (home, clinic or office, retail store) - write blood pressure results in a log or diary    Why is this important?   You won't feel high blood pressure, but it can still hurt your blood vessels.  High blood pressure can cause heart or kidney problems. It can also cause a stroke.  Making lifestyle changes like losing a little weight or eating less salt will help.  Checking your blood pressure at home and at different times of the day can help to control blood pressure.  If the doctor prescribes medicine remember to take it the way the doctor ordered.  Call the office if you cannot afford the medicine or if there are questions about it.      Plan: Telephone follow up appointment with care management team member scheduled  for:  6 months The patient has been provided with contact information for the care management team and has been advised to call with any health related questions or concerns.   Tomasa Blase, PharmD Clinical Pharmacist, [facility]    Please call the care guide team at [phone] if you need to cancel or reschedule your appointment.   Patient verbalizes understanding of instructions and care plan provided today and agrees to view in [place]. Active MyChart status and patient understanding of how to access instructions and care plan via MyChart confirmed with patient.

## 2022-04-11 DIAGNOSIS — J449 Chronic obstructive pulmonary disease, unspecified: Secondary | ICD-10-CM | POA: Diagnosis not present

## 2022-04-11 DIAGNOSIS — N183 Chronic kidney disease, stage 3 unspecified: Secondary | ICD-10-CM

## 2022-04-11 DIAGNOSIS — M8580 Other specified disorders of bone density and structure, unspecified site: Secondary | ICD-10-CM

## 2022-04-11 DIAGNOSIS — E785 Hyperlipidemia, unspecified: Secondary | ICD-10-CM | POA: Diagnosis not present

## 2022-04-11 DIAGNOSIS — I130 Hypertensive heart and chronic kidney disease with heart failure and stage 1 through stage 4 chronic kidney disease, or unspecified chronic kidney disease: Secondary | ICD-10-CM

## 2022-04-11 DIAGNOSIS — Z87891 Personal history of nicotine dependence: Secondary | ICD-10-CM

## 2022-04-11 DIAGNOSIS — I5030 Unspecified diastolic (congestive) heart failure: Secondary | ICD-10-CM | POA: Diagnosis not present

## 2022-04-26 ENCOUNTER — Other Ambulatory Visit: Payer: Self-pay | Admitting: Emergency Medicine

## 2022-05-05 ENCOUNTER — Telehealth: Payer: Medicare Other | Admitting: Nurse Practitioner

## 2022-05-05 DIAGNOSIS — B9689 Other specified bacterial agents as the cause of diseases classified elsewhere: Secondary | ICD-10-CM

## 2022-05-05 DIAGNOSIS — J069 Acute upper respiratory infection, unspecified: Secondary | ICD-10-CM

## 2022-05-05 MED ORDER — AZITHROMYCIN 250 MG PO TABS: ORAL_TABLET | ORAL | 0 refills | Status: AC

## 2022-05-08 ENCOUNTER — Other Ambulatory Visit: Payer: Self-pay | Admitting: Emergency Medicine

## 2022-05-18 ENCOUNTER — Ambulatory Visit: Payer: Medicare Other

## 2022-05-25 ENCOUNTER — Encounter: Payer: Self-pay | Admitting: Internal Medicine

## 2022-05-25 ENCOUNTER — Encounter: Payer: Self-pay | Admitting: Emergency Medicine

## 2022-05-25 DIAGNOSIS — E782 Mixed hyperlipidemia: Secondary | ICD-10-CM

## 2022-05-28 ENCOUNTER — Ambulatory Visit (INDEPENDENT_AMBULATORY_CARE_PROVIDER_SITE_OTHER): Payer: Medicare Other

## 2022-05-28 VITALS — Ht 63.5 in | Wt 180.0 lb

## 2022-05-28 DIAGNOSIS — Z0000 Encounter for general adult medical examination without abnormal findings: Secondary | ICD-10-CM | POA: Diagnosis not present

## 2022-05-28 NOTE — Telephone Encounter (Signed)
Okay to order as requested.  Thanks.

## 2022-05-28 NOTE — Patient Instructions (Signed)
Tammy Ball , Thank you for taking time to come for your Medicare Wellness Visit. I appreciate your ongoing commitment to your health goals. Please review the following plan we discussed and let me know if I can assist you in the future.   Screening recommendations/referrals: Colonoscopy: not required Mammogram: not required Bone Density: completed 05/17/2015 Recommended yearly ophthalmology/optometry visit for glaucoma screening and checkup Recommended yearly dental visit for hygiene and checkup  Vaccinations: Influenza vaccine: due 06/12/2022 Pneumococcal vaccine: completed 08/23/2017 Tdap vaccine: completed 12/02/2013, due 12/03/2023 Shingles vaccine: completed    Covid-19: 08/21/2021, 03/23/2021, 05/07/2020, 01/18/2020, 12/24/2019  Advanced directives: Please bring a copy of your POA (Power of Attorney) and/or Living Will to your next appointment.   Conditions/risks identified: none  Next appointment: Follow up in one year for your annual wellness visit    Preventive Care 65 Years and Older, Female Preventive care refers to lifestyle choices and visits with your health care provider that can promote health and wellness. What does preventive care include? A yearly physical exam. This is also called an annual well check. Dental exams once or twice a year. Routine eye exams. Ask your health care provider how often you should have your eyes checked. Personal lifestyle choices, including: Daily care of your teeth and gums. Regular physical activity. Eating a healthy diet. Avoiding tobacco and drug use. Limiting alcohol use. Practicing safe sex. Taking low-dose aspirin every day. Taking vitamin and mineral supplements as recommended by your health care provider. What happens during an annual well check? The services and screenings done by your health care provider during your annual well check will depend on your age, overall health, lifestyle risk factors, and family history of  disease. Counseling  Your health care provider may ask you questions about your: Alcohol use. Tobacco use. Drug use. Emotional well-being. Home and relationship well-being. Sexual activity. Eating habits. History of falls. Memory and ability to understand (cognition). Work and work Statistician. Reproductive health. Screening  You may have the following tests or measurements: Height, weight, and BMI. Blood pressure. Lipid and cholesterol levels. These may be checked every 5 years, or more frequently if you are over 30 years old. Skin check. Lung cancer screening. You may have this screening every year starting at age 67 if you have a 30-pack-year history of smoking and currently smoke or have quit within the past 15 years. Fecal occult blood test (FOBT) of the stool. You may have this test every year starting at age 40. Flexible sigmoidoscopy or colonoscopy. You may have a sigmoidoscopy every 5 years or a colonoscopy every 10 years starting at age 76. Hepatitis C blood test. Hepatitis B blood test. Sexually transmitted disease (STD) testing. Diabetes screening. This is done by checking your blood sugar (glucose) after you have not eaten for a while (fasting). You may have this done every 1-3 years. Bone density scan. This is done to screen for osteoporosis. You may have this done starting at age 4. Mammogram. This may be done every 1-2 years. Talk to your health care provider about how often you should have regular mammograms. Talk with your health care provider about your test results, treatment options, and if necessary, the need for more tests. Vaccines  Your health care provider may recommend certain vaccines, such as: Influenza vaccine. This is recommended every year. Tetanus, diphtheria, and acellular pertussis (Tdap, Td) vaccine. You may need a Td booster every 10 years. Zoster vaccine. You may need this after age 33. Pneumococcal 13-valent conjugate (PCV13)  vaccine. One  dose is recommended after age 78. Pneumococcal polysaccharide (PPSV23) vaccine. One dose is recommended after age 45. Talk to your health care provider about which screenings and vaccines you need and how often you need them. This information is not intended to replace advice given to you by your health care provider. Make sure you discuss any questions you have with your health care provider. Document Released: 11/25/2015 Document Revised: 07/18/2016 Document Reviewed: 08/30/2015 Elsevier Interactive Patient Education  2017 [employer] Prevention in the Home Falls can cause injuries. They can happen to people of all ages. There are many things you can do to make your home safe and to help prevent falls. What can I do on the outside of my home? Regularly fix the edges of walkways and driveways and fix any cracks. Remove anything that might make you trip as you walk through a door, such as a raised step or threshold. Trim any bushes or trees on the path to your home. Use bright outdoor lighting. Clear any walking paths of anything that might make someone trip, such as rocks or tools. Regularly check to see if handrails are loose or broken. Make sure that both sides of any steps have handrails. Any raised decks and porches should have guardrails on the edges. Have any leaves, snow, or ice cleared regularly. Use sand or salt on walking paths during winter. Clean up any spills in your garage right away. This includes oil or grease spills. What can I do in the bathroom? Use night lights. Install grab bars by the toilet and in the tub and shower. Do not use towel bars as grab bars. Use non-skid mats or decals in the tub or shower. If you need to sit down in the shower, use a plastic, non-slip stool. Keep the floor dry. Clean up any water that spills on the floor as soon as it happens. Remove soap buildup in the tub or shower regularly. Attach bath mats securely with double-sided  non-slip rug tape. Do not have throw rugs and other things on the floor that can make you trip. What can I do in the bedroom? Use night lights. Make sure that you have a light by your bed that is easy to reach. Do not use any sheets or blankets that are too big for your bed. They should not hang down onto the floor. Have a firm chair that has side arms. You can use this for support while you get dressed. Do not have throw rugs and other things on the floor that can make you trip. What can I do in the kitchen? Clean up any spills right away. Avoid walking on wet floors. Keep items that you use a lot in easy-to-reach places. If you need to reach something above you, use a strong step stool that has a grab bar. Keep electrical cords out of the way. Do not use floor polish or wax that makes floors slippery. If you must use wax, use non-skid floor wax. Do not have throw rugs and other things on the floor that can make you trip. What can I do with my stairs? Do not leave any items on the stairs. Make sure that there are handrails on both sides of the stairs and use them. Fix handrails that are broken or loose. Make sure that handrails are as long as the stairways. Check any carpeting to make sure that it is firmly attached to the stairs. Fix any carpet that is loose  or worn. Avoid having throw rugs at the top or bottom of the stairs. If you do have throw rugs, attach them to the floor with carpet tape. Make sure that you have a light switch at the top of the stairs and the bottom of the stairs. If you do not have them, ask someone to add them for you. What else can I do to help prevent falls? Wear shoes that: Do not have high heels. Have rubber bottoms. Are comfortable and fit you well. Are closed at the toe. Do not wear sandals. If you use a stepladder: Make sure that it is fully opened. Do not climb a closed stepladder. Make sure that both sides of the stepladder are locked into place. Ask  someone to hold it for you, if possible. Clearly mark and make sure that you can see: Any grab bars or handrails. First and last steps. Where the edge of each step is. Use tools that help you move around (mobility aids) if they are needed. These include: Canes. Walkers. Scooters. Crutches. Turn on the lights when you go into a dark area. Replace any light bulbs as soon as they burn out. Set up your furniture so you have a clear path. Avoid moving your furniture around. If any of your floors are uneven, fix them. If there are any pets around you, be aware of where they are. Review your medicines with your doctor. Some medicines can make you feel dizzy. This can increase your chance of falling. Ask your doctor what other things that you can do to help prevent falls. This information is not intended to replace advice given to you by your health care provider. Make sure you discuss any questions you have with your health care provider. Document Released: 08/25/2009 Document Revised: 04/05/2016 Document Reviewed: 12/03/2014 Elsevier Interactive Patient Education  2017 [employer].

## 2022-05-28 NOTE — Progress Notes (Signed)
I connected with Tammy Ball today by telephone and verified that I am speaking with the correct person using two identifiers. Location patient: home Location provider: work Persons participating in the virtual visit: [place], [place] LPN.   I discussed the limitations, risks, security and privacy concerns of performing an evaluation and management service by telephone and the availability of in person appointments. I also discussed with the patient that there may be a patient responsible charge related to this service. The patient expressed understanding and verbally consented to this telephonic visit.    Interactive audio and video telecommunications were attempted between this provider and patient, however failed, due to patient having technical difficulties OR patient did not have access to video capability.  We continued and completed visit with audio only.     Vital signs may be patient reported or missing.  Subjective:   Tammy Ball is a 81 y.o. female who presents for Medicare Annual (Subsequent) preventive examination.  Review of Systems     Cardiac Risk Factors include: advanced age (>77men, >31 women);dyslipidemia;hypertension;obesity (BMI >30kg/m2)     Objective:    Today's Vitals   05/28/22 1526 05/28/22 1527  Weight: 180 lb (81.6 kg)   Height: 5' 3.5" (1.613 m)   PainSc:  6    Body mass index is 31.39 kg/m.     05/28/2022    3:34 PM 12/25/2021    8:49 AM 05/18/2021    1:50 PM 11/17/2020    3:30 PM 11/10/2020   10:42 AM 11/05/2020   10:45 AM 03/29/2020    2:26 PM  Advanced Directives  Does Patient Have a Medical Advance Directive? Yes Yes Yes Yes No No Yes  Type of Paramedic of [place];Living will Living will Living will;Healthcare Power of [place];Living will   [place]  Does patient want to make changes to medical advance directive?   No - Patient declined No - Patient  declined   No - Patient declined  Copy of [place] in Chart? No - copy requested  No - copy requested No - copy requested     Would patient like information on creating a medical advance directive?     No - Patient declined      Current Medications (verified) Outpatient Encounter Medications as of 05/28/2022  Medication Sig   acetaminophen (TYLENOL) 500 MG tablet Take 500 mg by mouth every 6 (six) hours as needed for mild pain, moderate pain, fever or headache. Reported on 05/24/2016   albuterol (PROVENTIL) (2.5 MG/3ML) 0.083% nebulizer solution Take 3 mLs (2.5 mg total) by nebulization every 6 (six) hours as needed for wheezing or shortness of breath.   albuterol (VENTOLIN HFA) 108 (90 Base) MCG/ACT inhaler Inhale 1-2 puffs into the lungs every 6 (six) hours as needed for wheezing or shortness of breath.   Ascorbic Acid (VITAMIN C) 1000 MG tablet Take 1,000 mg by mouth daily.   aspirin EC 81 MG tablet Take 81 mg by mouth daily.   atorvastatin (LIPITOR) 40 MG tablet TAKE 1 TABLET BY MOUTH EVERY DAY   calcium carbonate (TUMS EX) 750 MG chewable tablet Chew 1 tablet by mouth daily as needed for heartburn.   Carboxymethylcellulose Sodium (ARTIFICIAL TEARS OP) Place 1 drop into both eyes daily as needed (dry eyes).   Cholecalciferol (VITAMIN D3 PO) Take 1,000 Units by mouth daily.   Fluticasone-Umeclidin-Vilant (TRELEGY ELLIPTA) 100-62.5-25 MCG/ACT AEPB Inhale 1 puff into the lungs  daily.   Guaifenesin (MUCINEX MAXIMUM STRENGTH) 1200 MG TB12 Take 1,200 mg by mouth 2 (two) times daily.   losartan (COZAAR) 100 MG tablet TAKE 1 TABLET BY MOUTH EVERY DAY   OMEGA-3 FATTY ACIDS PO Take 1,200 mg by mouth daily.   OXYGEN Inhale 2 L into the lungs continuous.   sodium chloride (OCEAN) 0.65 % SOLN nasal spray Place 1 spray into both nostrils daily.   traMADol (ULTRAM) 50 MG tablet Take 1 tablet (50 mg total) by mouth every 6 (six) hours as needed (BACK PAIN).   Zinc Sulfate (ZINC 15 PO)  Take by mouth.   carvedilol (COREG) 12.5 MG tablet Take 1 tablet (12.5 mg total) by mouth 2 (two) times daily.   No facility-administered encounter medications on file as of 05/28/2022.    Allergies (verified) Bee venom, Amlodipine, Levofloxacin, Alendronate, and Hctz [hydrochlorothiazide]   History: Past Medical History:  Diagnosis Date   Anemia    was while doing chemo   Anxiety    Asthma    Chemotherapy-induced neuropathy ([place]) 11/01/2015   Claustrophobia    COPD (chronic obstructive pulmonary disease) ([place])    Depression    Encounter for antineoplastic chemotherapy 02/09/2016   Glaucoma    Headache    prior to menopause   Heart murmur    History of echocardiogram    Echo 2/17: EF 94-49%, grade 1 diastolic dysfunction, mild MR, trivial pericardial effusion   History of nuclear stress test    Myoview 2/17: no ischemia or scar, EF 79%; low risk   History of radiation therapy 10/30/16-11/06/16   Hyperlipidemia    Hypertension    Insomnia 05/16/2016   Non-small cell carcinoma of right lung, stage 2 ([place]) 10/03/2015   Radiation 02/29/16-04/10/16   50.4 Gy to right central chest   Renal cell carcinoma ([place])    L nephrectomy  in 2012   Past Surgical History:  Procedure Laterality Date   BACK SURGERY     cervical 1991   EYE SURGERY     IR GENERIC HISTORICAL  01/16/2017   IR RADIOLOGIST EVAL & MGMT 01/16/2017 [facility]   IR GENERIC HISTORICAL  01/31/2017   IR FLUORO GUIDED NEEDLE PLC ASPIRATION/INJECTION LOC 01/31/2017 Luanne Bras, MD [facility]   IR GENERIC HISTORICAL  01/31/2017   IR VERTEBROPLASTY CERV/THOR BX INC UNI/BIL INC/INJECT/IMAGING 01/31/2017 Luanne Bras, MD [facility]   IR GENERIC HISTORICAL  01/31/2017   IR VERTEBROPLASTY EA ADDL (T&LS) BX INC UNI/BIL INC INJECT/IMAGING 01/31/2017 Luanne Bras, MD [facility]   kidney cancer     NEPHRECTOMY     SPINE SURGERY     TUBAL LIGATION     VIDEO ASSISTED THORACOSCOPY (VATS)/WEDGE RESECTION Right  01/18/2016   Procedure: VIDEO ASSISTED THORACOSCOPY (VATS)/LUNG RESECTION, THOROCOTOMY, RIGHT UPPER LOBECTOMY, LYMPH NODE DISSECTION, PLACEMENT OF ON Q;  Surgeon: [name], MD;  Location: [facility];  Service: Thoracic;  Laterality: Right;   VIDEO BRONCHOSCOPY Bilateral 09/20/2015   Procedure: VIDEO BRONCHOSCOPY WITHOUT FLUORO;  Surgeon: Rigoberto Noel, MD;  Location: [facility];  Service: Cardiopulmonary;  Laterality: Bilateral;   VIDEO BRONCHOSCOPY N/A 01/18/2016   Procedure: VIDEO BRONCHOSCOPY;  Surgeon: [name], MD;  Location[facility];  Service: Thoracic;  Laterality: N/A;   Family History  Problem Relation Age of Onset   Heart disease Sister    Obesity Brother    Heart attack Daughter 65       s/p CABG   Glaucoma Daughter    Breast  cancer Sister    Birth defects Sister    Cancer Mother        Bladder Cancer   Hypertension Mother    CAD Mother 77   Cancer Maternal Grandmother    Social History   Socioeconomic History   Marital status: Widowed    Spouse name: Not on file   Number of children: 4   Years of education: Not on file   Highest education level: High school graduate  Occupational History   Not on file  Tobacco Use   Smoking status: Former    Packs/day: 1.00    Years: 50.00    Total pack years: 50.00    Types: Cigarettes    Quit date: 03/16/2011    Years since quitting: 11.2   Smokeless tobacco: Never   Tobacco comments:    last 4-5 years of smoking, smoked 0.5 pack/day   Vaping Use   Vaping Use: Never used  Substance and Sexual Activity   Alcohol use: No    Alcohol/week: 0.0 standard drinks of alcohol   Drug use: No   Sexual activity: Not Currently  Other Topics Concern   Not on file  Social History Narrative   Marital status: divorced; not dating in 2019.      Children: 4 children; 3 grandchildren adult; 4 gg.      Lives: alone in house      Employment: part-time work; substance abuse counselor; [employer].      Tobacco: quit smoking  2012; smoked 45 years      Alcohol: none      Drugs: none      Exercise: none in 2019; due to LEFT sciatica.      ADLs: independent with ADLs; drives.       Advanced Directives: YES: HCPOA: Nicholas Martinez/son.  FULL CODE but no prolonged measures.      Occupation: Substance Abuse Estate agent   No exercise** Merged History Encounter **       ** Data from: 12/14/11 Enc Dept: [facility]       ** Data from: 12/17/11 Enc Dept: [facility]   Substance abuse counselor   Husband deceased   4 great grandchildren   Son works in same substance abuse counseling center as patient   Social Determinants of Radio broadcast assistant Strain: Low Risk  (05/28/2022)   Overall Financial Resource Strain (CARDIA)    Difficulty of Paying Living Expenses: Not hard at all  Food Insecurity: No Food Insecurity (05/28/2022)   Hunger Vital Sign    Worried About Running Out of Food in the Last Year: Never true    Ran Out of Food in the Last Year: Never true  Transportation Needs: No Transportation Needs (05/28/2022)   PRAPARE - Hydrologist (Medical): No    Lack of Transportation (Non-Medical): No  Physical Activity: Insufficiently Active (05/28/2022)   Exercise Vital Sign    Days of Exercise per Week: 7 days    Minutes of Exercise per Session: 20 min  Stress: No Stress Concern Present (05/28/2022)   [place]    Feeling of Stress : Not at all  Social Connections: Moderately Integrated (05/18/2021)   Social Connection and Isolation Panel [NHANES]    Frequency of Communication with Friends and Family: More than three times a week    Frequency of Social Gatherings with Friends  and Family: More than three times a week    Attends Religious Services: More than 4 times per year    Active Member of Clubs or Organizations: No    Attends Music therapist: More  than 4 times per year    Marital Status: Divorced    Tobacco Counseling Counseling given: Not Answered Tobacco comments: last 4-5 years of smoking, smoked 0.5 pack/day    Clinical Intake:  Pre-visit preparation completed: Yes  Pain : 0-10 Pain Score: 6  Pain Type: Chronic pain Pain Location: Back Pain Orientation: Lower Pain Descriptors / Indicators: Aching Pain Onset: More than a month ago Pain Frequency: Intermittent     Nutritional Status: BMI > 30  Obese Nutritional Risks: None Diabetes: No  How often do you need to have someone help you when you read instructions, pamphlets, or other written materials from your doctor or pharmacy?: 1 - Never  Diabetic? no  Interpreter Needed?: No  Information entered by :: NAllen LPN   Activities of Daily Living    05/28/2022    3:35 PM  In your present state of health, do you have any difficulty performing the following activities:  Hearing? 0  Vision? 1  Comment sometimes blurry  Difficulty concentrating or making decisions? 0  Walking or climbing stairs? 0  Dressing or bathing? 0  Doing errands, shopping? 0  Preparing Food and eating ? N  Using the Toilet? N  In the past six months, have you accidently leaked urine? Y  Do you have problems with loss of bowel control? N  Managing your Medications? N  Managing your Finances? N  Housekeeping or managing your Housekeeping? N    Patient Care Team: Horald Pollen, MD as PCP - General (Internal Medicine) Curt Bears, MD as Consulting Physician (Oncology) [name], MD (Inactive) as Consulting Physician (Cardiothoracic Surgery) Calvert Cantor, MD as Consulting Physician (Ophthalmology) Szabat, Darnelle Maffucci, [facility] (Inactive) as Pharmacist (Pharmacist)  Indicate any recent Medical Services you may have received from other than [facility] providers in the past year (date may be approximate).     Assessment:   This is a routine wellness examination for  Tammy Ball.  Hearing/Vision screen Vision Screening - Comments:: Regular eye exams, Dr. Bing Plume  Dietary issues and exercise activities discussed: Current Exercise Habits: Home exercise routine, Type of exercise: walking, Time (Minutes): 20, Frequency (Times/Week): 7, Weekly Exercise (Minutes/Week): 140   Goals Addressed             This Visit's Progress    Patient Stated       05/28/2022, wants to lose weight       Depression Screen    05/28/2022    3:35 PM 09/06/2021    9:42 AM 05/18/2021    1:48 PM 02/21/2021   10:53 AM 11/22/2020   11:17 AM 11/17/2020    3:27 PM 03/29/2020    2:28 PM  PHQ 2/9 Scores  PHQ - 2 Score 0 0 2 3 0 1 0  PHQ- 9 Score    7       Fall Risk    05/28/2022    3:34 PM 09/06/2021    9:42 AM 05/18/2021    1:52 PM 02/21/2021   10:06 AM 11/22/2020   11:17 AM  Fall Risk   Falls in the past year? 0 0 1 1 1   Number falls in past yr: 0 0 0 0 0  Injury with Fall? 0 0 1 0 0  Risk for  fall due to : Medication side effect   No Fall Risks   Follow up Falls evaluation completed;Education provided;Falls prevention discussed  Falls evaluation completed Falls evaluation completed Falls evaluation completed    FALL RISK PREVENTION PERTAINING TO THE HOME:  Any stairs in or around the home? Yes  If so, are there any without handrails? No  Home free of loose throw rugs in walkways, pet beds, electrical cords, etc? Yes  Adequate lighting in your home to reduce risk of falls? Yes   ASSISTIVE DEVICES UTILIZED TO PREVENT FALLS:  Life alert? No  Use of a cane, walker or w/c? No  Grab bars in the bathroom? Yes  Shower chair or bench in shower? No  Elevated toilet seat or a handicapped toilet? No   TIMED UP AND GO:  Was the test performed? No .      Cognitive Function:        05/28/2022    3:41 PM 03/29/2020    2:27 PM 11/26/2018   10:57 AM 11/13/2017    3:39 PM  6CIT Screen  What Year? 0 points 0 points 0 points 0 points  What month? 0 points 0 points 0 points  0 points  What time? 0 points 0 points 0 points 0 points  Count back from 20 0 points 0 points 0 points 0 points  Months in reverse 0 points 0 points 0 points 0 points  Repeat phrase 0 points 0 points 0 points 0 points  Total Score 0 points 0 points 0 points 0 points    Immunizations Immunization History  Administered Date(s) Administered   Fluad Quad(high Dose 65+) 07/18/2019   Hepatitis B 11/12/1998   Influenza Split 09/12/2012   Influenza, High Dose Seasonal PF 08/23/2017, 08/23/2018   Influenza,inj,Quad PF,6+ Mos 07/18/2013   Influenza-Unspecified 07/29/2014, 07/23/2015, 08/25/2016, 07/28/2020, 08/21/2021   PFIZER(Purple Top)SARS-COV-2 Vaccination 12/24/2019, 01/18/2020, 05/07/2020, 03/23/2021, 08/21/2021   Pneumococcal Conjugate-13 07/29/2015   Pneumococcal Polysaccharide-23 11/12/2000, 10/23/2012, 08/23/2017   Pneumococcal-Unspecified 10/23/2012, 08/23/2017   Tdap 12/02/2013   Zoster, Live 12/13/2010    TDAP status: Up to date  Flu Vaccine status: Up to date  Pneumococcal vaccine status: Up to date  Covid-19 vaccine status: Completed vaccines  Qualifies for Shingles Vaccine? Yes   Zostavax completed Yes   Shingrix Completed?: No.    Education has been provided regarding the importance of this vaccine. Patient has been advised to call insurance company to determine out of pocket expense if they have not yet received this vaccine. Advised may also receive vaccine at local pharmacy or Health Dept. Verbalized acceptance and understanding.  Screening Tests Health Maintenance  Topic Date Due   Hepatitis C Screening  Never done   Zoster Vaccines- Shingrix (1 of 2) Never done   COVID-19 Vaccine (6 - Booster for Pfizer series) 10/16/2021   INFLUENZA VACCINE  06/12/2022   TETANUS/TDAP  12/03/2023   Pneumonia Vaccine 60+ Years old  Completed   DEXA SCAN  Completed   HPV VACCINES  Aged Out   COLONOSCOPY (Pts 45-22yrs Insurance coverage will need to be confirmed)   Discontinued    Health Maintenance  Health Maintenance Due  Topic Date Due   Hepatitis C Screening  Never done   Zoster Vaccines- Shingrix (1 of 2) Never done   COVID-19 Vaccine (6 - Booster for Pfizer series) 10/16/2021    Colorectal cancer screening: No longer required.   Mammogram status: No longer required due to age.  Bone Density status: Completed 05/17/2015.  Lung Cancer Screening: (Low Dose CT Chest recommended if Age 24-80 years, 30 pack-year currently smoking OR have quit w/in 15years.) does qualify.   Lung Cancer Screening Referral: CT scan 12/08/2021  Additional Screening:  Hepatitis C Screening: does qualify;   Vision Screening: Recommended annual ophthalmology exams for early detection of glaucoma and other disorders of the eye. Is the patient up to date with their annual eye exam?  Yes  Who is the provider or what is the name of the office in which the patient attends annual eye exams? Dr. Bing Plume If pt is not established with a provider, would they like to be referred to a provider to establish care? No .   Dental Screening: Recommended annual dental exams for proper oral hygiene  Community Resource Referral / Chronic Care Management: CRR required this visit?  No   CCM required this visit?  No      Plan:     I have personally reviewed and noted the following in the patient's chart:   Medical and social history Use of alcohol, tobacco or illicit drugs  Current medications and supplements including opioid prescriptions.  Functional ability and status Nutritional status Physical activity Advanced directives List of other physicians Hospitalizations, surgeries, and ER visits in previous 12 months Vitals Screenings to include cognitive, depression, and falls Referrals and appointments  In addition, I have reviewed and discussed with patient certain preventive protocols, quality metrics, and best practice recommendations. A written personalized care plan  for preventive services as well as general preventive health recommendations were provided to patient.     Kellie Simmering, LPN   8/59/2924   Nurse Notes: none  Due to this being a virtual visit, the after visit summary with patients personalized plan was offered to patient via mail or my-chart. Patient would like to access on my-chart

## 2022-05-29 ENCOUNTER — Telehealth: Payer: Self-pay | Admitting: Internal Medicine

## 2022-05-29 NOTE — Telephone Encounter (Signed)
Called patient regarding upcoming August appointments, patient is notified. 

## 2022-06-09 ENCOUNTER — Other Ambulatory Visit: Payer: Self-pay

## 2022-06-09 ENCOUNTER — Encounter (HOSPITAL_COMMUNITY): Payer: Self-pay

## 2022-06-09 ENCOUNTER — Emergency Department (HOSPITAL_COMMUNITY): Payer: Medicare Other

## 2022-06-09 ENCOUNTER — Inpatient Hospital Stay (HOSPITAL_COMMUNITY)
Admission: EM | Admit: 2022-06-09 | Discharge: 2022-06-11 | DRG: 281 | Disposition: A | Discharge status: Home / Self Care | Payer: Medicare Other | Attending: Internal Medicine | Admitting: Internal Medicine

## 2022-06-09 DIAGNOSIS — R7989 Other specified abnormal findings of blood chemistry: Secondary | ICD-10-CM

## 2022-06-09 DIAGNOSIS — J449 Chronic obstructive pulmonary disease, unspecified: Secondary | ICD-10-CM | POA: Diagnosis present

## 2022-06-09 DIAGNOSIS — I2721 Secondary pulmonary arterial hypertension: Secondary | ICD-10-CM | POA: Diagnosis present

## 2022-06-09 DIAGNOSIS — J439 Emphysema, unspecified: Secondary | ICD-10-CM | POA: Diagnosis present

## 2022-06-09 DIAGNOSIS — R195 Other fecal abnormalities: Secondary | ICD-10-CM | POA: Diagnosis present

## 2022-06-09 DIAGNOSIS — Z7951 Long term (current) use of inhaled steroids: Secondary | ICD-10-CM

## 2022-06-09 DIAGNOSIS — K829 Disease of gallbladder, unspecified: Secondary | ICD-10-CM | POA: Diagnosis not present

## 2022-06-09 DIAGNOSIS — G4700 Insomnia, unspecified: Secondary | ICD-10-CM | POA: Diagnosis present

## 2022-06-09 DIAGNOSIS — Z87891 Personal history of nicotine dependence: Secondary | ICD-10-CM

## 2022-06-09 DIAGNOSIS — R1013 Epigastric pain: Secondary | ICD-10-CM | POA: Diagnosis not present

## 2022-06-09 DIAGNOSIS — Z8052 Family history of malignant neoplasm of bladder: Secondary | ICD-10-CM | POA: Diagnosis not present

## 2022-06-09 DIAGNOSIS — Z7982 Long term (current) use of aspirin: Secondary | ICD-10-CM

## 2022-06-09 DIAGNOSIS — E871 Hypo-osmolality and hyponatremia: Secondary | ICD-10-CM | POA: Diagnosis present

## 2022-06-09 DIAGNOSIS — Z803 Family history of malignant neoplasm of breast: Secondary | ICD-10-CM | POA: Diagnosis not present

## 2022-06-09 DIAGNOSIS — R1030 Lower abdominal pain, unspecified: Secondary | ICD-10-CM | POA: Diagnosis not present

## 2022-06-09 DIAGNOSIS — E860 Dehydration: Secondary | ICD-10-CM | POA: Diagnosis not present

## 2022-06-09 DIAGNOSIS — E785 Hyperlipidemia, unspecified: Secondary | ICD-10-CM | POA: Diagnosis present

## 2022-06-09 DIAGNOSIS — Z902 Acquired absence of lung [part of]: Secondary | ICD-10-CM

## 2022-06-09 DIAGNOSIS — Z905 Acquired absence of kidney: Secondary | ICD-10-CM | POA: Diagnosis not present

## 2022-06-09 DIAGNOSIS — Z79899 Other long term (current) drug therapy: Secondary | ICD-10-CM | POA: Diagnosis not present

## 2022-06-09 DIAGNOSIS — I214 Non-ST elevation (NSTEMI) myocardial infarction: Principal | ICD-10-CM | POA: Diagnosis present

## 2022-06-09 DIAGNOSIS — N858 Other specified noninflammatory disorders of uterus: Secondary | ICD-10-CM | POA: Diagnosis not present

## 2022-06-09 DIAGNOSIS — Z888 Allergy status to other drugs, medicaments and biological substances status: Secondary | ICD-10-CM

## 2022-06-09 DIAGNOSIS — H409 Unspecified glaucoma: Secondary | ICD-10-CM | POA: Diagnosis present

## 2022-06-09 DIAGNOSIS — I10 Essential (primary) hypertension: Secondary | ICD-10-CM | POA: Diagnosis present

## 2022-06-09 DIAGNOSIS — M4316 Spondylolisthesis, lumbar region: Secondary | ICD-10-CM | POA: Diagnosis present

## 2022-06-09 DIAGNOSIS — Z20822 Contact with and (suspected) exposure to covid-19: Secondary | ICD-10-CM | POA: Diagnosis not present

## 2022-06-09 DIAGNOSIS — I249 Acute ischemic heart disease, unspecified: Secondary | ICD-10-CM | POA: Diagnosis not present

## 2022-06-09 DIAGNOSIS — R0602 Shortness of breath: Secondary | ICD-10-CM | POA: Diagnosis not present

## 2022-06-09 DIAGNOSIS — R079 Chest pain, unspecified: Secondary | ICD-10-CM | POA: Diagnosis not present

## 2022-06-09 DIAGNOSIS — N179 Acute kidney failure, unspecified: Secondary | ICD-10-CM | POA: Diagnosis present

## 2022-06-09 DIAGNOSIS — I700 Atherosclerosis of aorta: Secondary | ICD-10-CM | POA: Diagnosis present

## 2022-06-09 DIAGNOSIS — Z881 Allergy status to other antibiotic agents status: Secondary | ICD-10-CM

## 2022-06-09 DIAGNOSIS — Z923 Personal history of irradiation: Secondary | ICD-10-CM

## 2022-06-09 DIAGNOSIS — I25119 Atherosclerotic heart disease of native coronary artery with unspecified angina pectoris: Secondary | ICD-10-CM | POA: Diagnosis not present

## 2022-06-09 DIAGNOSIS — Z85528 Personal history of other malignant neoplasm of kidney: Secondary | ICD-10-CM

## 2022-06-09 DIAGNOSIS — I25110 Atherosclerotic heart disease of native coronary artery with unstable angina pectoris: Secondary | ICD-10-CM | POA: Diagnosis not present

## 2022-06-09 DIAGNOSIS — R100 Acute abdomen: Secondary | ICD-10-CM | POA: Diagnosis not present

## 2022-06-09 DIAGNOSIS — Z85118 Personal history of other malignant neoplasm of bronchus and lung: Secondary | ICD-10-CM

## 2022-06-09 DIAGNOSIS — R509 Fever, unspecified: Secondary | ICD-10-CM | POA: Diagnosis present

## 2022-06-09 DIAGNOSIS — Z8249 Family history of ischemic heart disease and other diseases of the circulatory system: Secondary | ICD-10-CM | POA: Diagnosis not present

## 2022-06-09 DIAGNOSIS — R112 Nausea with vomiting, unspecified: Secondary | ICD-10-CM | POA: Diagnosis not present

## 2022-06-09 LAB — COMPREHENSIVE METABOLIC PANEL
ALT: 21 U/L (ref 0–44)
AST: 25 U/L (ref 15–41)
Albumin: 3.8 g/dL (ref 3.5–5.0)
Alkaline Phosphatase: 60 U/L (ref 38–126)
Anion gap: 11 (ref 5–15)
BUN: 30 mg/dL — ABNORMAL HIGH (ref 8–23)
CO2: 23 mmol/L (ref 22–32)
Calcium: 10.4 mg/dL — ABNORMAL HIGH (ref 8.9–10.3)
Chloride: 97 mmol/L — ABNORMAL LOW (ref 98–111)
Creatinine, Ser: 1.45 mg/dL — ABNORMAL HIGH (ref 0.44–1.00)
GFR, Estimated: 37 mL/min — ABNORMAL LOW (ref >60)
Glucose, Bld: 124 mg/dL — ABNORMAL HIGH (ref 70–99)
Potassium: 4.9 mmol/L (ref 3.5–5.1)
Sodium: 131 mmol/L — ABNORMAL LOW (ref 135–145)
Total Bilirubin: 0.7 mg/dL (ref 0.3–1.2)
Total Protein: 6.8 g/dL (ref 6.5–8.1)

## 2022-06-09 LAB — TROPONIN I (HIGH SENSITIVITY)
Troponin I (High Sensitivity): 618 ng/L (ref <18)
Troponin I (High Sensitivity): 694 ng/L (ref <18)
Troponin I (High Sensitivity): 439 ng/L (ref <18)
Troponin I (High Sensitivity): 336 ng/L (ref <18)

## 2022-06-09 LAB — CBC
HCT: 41 % (ref 36.0–46.0)
Hemoglobin: 13.2 g/dL (ref 12.0–15.0)
MCH: 29.5 pg (ref 26.0–34.0)
MCHC: 32.2 g/dL (ref 30.0–36.0)
MCV: 91.7 fL (ref 80.0–100.0)
Platelets: 276 10*3/uL (ref 150–400)
RBC: 4.47 MIL/uL (ref 3.87–5.11)
RDW: 18.6 % — ABNORMAL HIGH (ref 11.5–15.5)
WBC: 7.2 10*3/uL (ref 4.0–10.5)
nRBC: 0 % (ref 0.0–0.2)

## 2022-06-09 LAB — SARS CORONAVIRUS 2 BY RT PCR: SARS Coronavirus 2 by RT PCR: NEGATIVE

## 2022-06-09 LAB — HEPARIN LEVEL (UNFRACTIONATED): Heparin Unfractionated: 0.3 IU/mL (ref 0.30–0.70)

## 2022-06-09 LAB — LIPASE, BLOOD: Lipase: 30 U/L (ref 11–51)

## 2022-06-09 MED ORDER — IOHEXOL 350 MG/ML SOLN
75.0000 mL | Freq: Once | INTRAVENOUS | Status: AC | PRN
Start: 2022-06-09 — End: 2022-06-09
  Administered 2022-06-09: 75 mL via INTRAVENOUS

## 2022-06-09 MED ORDER — HEPARIN (PORCINE) 25000 UT/250ML-% IV SOLN
950.0000 [IU]/h | INTRAVENOUS | Status: AC
Start: 2022-06-09 — End: 2022-06-11
  Administered 2022-06-09: 800 [IU]/h via INTRAVENOUS
  Administered 2022-06-10: 950 [IU]/h via INTRAVENOUS
  Filled 2022-06-09 (×2): qty 250

## 2022-06-09 MED ORDER — FAMOTIDINE IN NACL 20-0.9 MG/50ML-% IV SOLN
20.0000 mg | Freq: Once | INTRAVENOUS | Status: AC
  Administered 2022-06-09: 20 mg via INTRAVENOUS
  Filled 2022-06-09: qty 50

## 2022-06-09 MED ORDER — LOSARTAN POTASSIUM 50 MG PO TABS
50.0000 mg | ORAL_TABLET | Freq: Two times a day (BID) | ORAL | Status: DC
  Administered 2022-06-09 (×2): 50 mg via ORAL
  Filled 2022-06-09: qty 1
  Filled 2022-06-09: qty 2

## 2022-06-09 MED ORDER — LIDOCAINE VISCOUS HCL 2 % MT SOLN
15.0000 mL | Freq: Once | OROMUCOSAL | Status: AC
  Administered 2022-06-09: 15 mL via ORAL
  Filled 2022-06-09: qty 15

## 2022-06-09 MED ORDER — ALUM & MAG HYDROXIDE-SIMETH 200-200-20 MG/5ML PO SUSP
30.0000 mL | Freq: Once | ORAL | Status: AC
  Administered 2022-06-09: 30 mL via ORAL
  Filled 2022-06-09: qty 30

## 2022-06-09 MED ORDER — SODIUM CHLORIDE 0.9 % IV BOLUS
500.0000 mL | Freq: Once | INTRAVENOUS | Status: AC
  Administered 2022-06-09: 500 mL via INTRAVENOUS

## 2022-06-09 MED ORDER — ALUM & MAG HYDROXIDE-SIMETH 200-200-20 MG/5ML PO SUSP: 30.0000 mL | Freq: Once | ORAL | Status: DC

## 2022-06-09 MED ORDER — ALBUTEROL SULFATE (2.5 MG/3ML) 0.083% IN NEBU
3.0000 mL | INHALATION_SOLUTION | Freq: Four times a day (QID) | RESPIRATORY_TRACT | Status: DC | PRN
  Administered 2022-06-10: 3 mL via RESPIRATORY_TRACT
  Filled 2022-06-09: qty 3

## 2022-06-09 MED ORDER — ALUM & MAG HYDROXIDE-SIMETH 200-200-20 MG/5ML PO SUSP
30.0000 mL | ORAL | Status: DC | PRN
  Administered 2022-06-09: 30 mL via ORAL
  Filled 2022-06-09: qty 30

## 2022-06-09 MED ORDER — ASPIRIN 325 MG PO TABS
325.0000 mg | ORAL_TABLET | Freq: Every day | ORAL | Status: DC
  Administered 2022-06-09: 325 mg via ORAL
  Filled 2022-06-09: qty 1

## 2022-06-09 MED ORDER — HYDROMORPHONE HCL 1 MG/ML IJ SOLN: 0.5000 mg | INTRAMUSCULAR | Status: DC | PRN

## 2022-06-09 MED ORDER — POLYVINYL ALCOHOL 1.4 % OP SOLN: 1.0000 [drp] | Freq: Every day | OPHTHALMIC | Status: DC | PRN

## 2022-06-09 MED ORDER — ASPIRIN 81 MG PO TBEC
81.0000 mg | DELAYED_RELEASE_TABLET | Freq: Every day | ORAL | Status: DC
Start: 2022-06-10 — End: 2022-06-11
  Administered 2022-06-10 – 2022-06-11 (×2): 81 mg via ORAL
  Filled 2022-06-09 (×2): qty 1

## 2022-06-09 MED ORDER — ONDANSETRON HCL 4 MG/2ML IJ SOLN
4.0000 mg | Freq: Once | INTRAMUSCULAR | Status: AC
  Administered 2022-06-09: 4 mg via INTRAVENOUS
  Filled 2022-06-09: qty 2

## 2022-06-09 MED ORDER — ONDANSETRON HCL 4 MG/2ML IJ SOLN: 4.0000 mg | Freq: Four times a day (QID) | INTRAMUSCULAR | Status: DC | PRN

## 2022-06-09 MED ORDER — CARVEDILOL 25 MG PO TABS
25.0000 mg | ORAL_TABLET | Freq: Two times a day (BID) | ORAL | Status: DC
  Administered 2022-06-09 – 2022-06-11 (×4): 25 mg via ORAL
  Filled 2022-06-09: qty 1
  Filled 2022-06-09 (×2): qty 2
  Filled 2022-06-09: qty 1

## 2022-06-09 MED ORDER — FLUTICASONE FUROATE-VILANTEROL 100-25 MCG/ACT IN AEPB
1.0000 | INHALATION_SPRAY | Freq: Every day | RESPIRATORY_TRACT | Status: DC
  Administered 2022-06-10 – 2022-06-11 (×2): 1 via RESPIRATORY_TRACT
  Filled 2022-06-09: qty 28

## 2022-06-09 MED ORDER — ACETAMINOPHEN 650 MG RE SUPP: 650.0000 mg | Freq: Four times a day (QID) | RECTAL | Status: DC | PRN

## 2022-06-09 MED ORDER — ASPIRIN 81 MG PO CHEW
324.0000 mg | CHEWABLE_TABLET | Freq: Once | ORAL | Status: DC
Start: 2022-06-09 — End: 2022-06-09

## 2022-06-09 MED ORDER — ORAL CARE MOUTH RINSE: 15.0000 mL | OROMUCOSAL | Status: DC | PRN

## 2022-06-09 MED ORDER — HYDRALAZINE HCL 20 MG/ML IJ SOLN: 10.0000 mg | INTRAMUSCULAR | Status: DC | PRN

## 2022-06-09 MED ORDER — LIDOCAINE VISCOUS HCL 2 % MT SOLN: 15.0000 mL | OROMUCOSAL | Status: DC | PRN

## 2022-06-09 MED ORDER — UMECLIDINIUM BROMIDE 62.5 MCG/ACT IN AEPB
1.0000 | INHALATION_SPRAY | Freq: Every day | RESPIRATORY_TRACT | Status: DC
  Administered 2022-06-10 – 2022-06-11 (×2): 1 via RESPIRATORY_TRACT
  Filled 2022-06-09: qty 7

## 2022-06-09 MED ORDER — TRAMADOL HCL 50 MG PO TABS: 50.0000 mg | ORAL_TABLET | Freq: Four times a day (QID) | ORAL | Status: DC | PRN

## 2022-06-09 MED ORDER — ALPRAZOLAM 0.25 MG PO TABS: 0.2500 mg | ORAL_TABLET | Freq: Two times a day (BID) | ORAL | Status: DC | PRN

## 2022-06-09 MED ORDER — PANTOPRAZOLE SODIUM 40 MG IV SOLR
40.0000 mg | Freq: Every day | INTRAVENOUS | Status: DC
  Administered 2022-06-09 – 2022-06-11 (×3): 40 mg via INTRAVENOUS
  Filled 2022-06-09 (×3): qty 10

## 2022-06-09 MED ORDER — ATORVASTATIN CALCIUM 40 MG PO TABS
40.0000 mg | ORAL_TABLET | Freq: Every day | ORAL | Status: DC
  Administered 2022-06-09 – 2022-06-11 (×3): 40 mg via ORAL
  Filled 2022-06-09 (×3): qty 1

## 2022-06-09 MED ORDER — HEPARIN BOLUS VIA INFUSION
3000.0000 [IU] | Freq: Once | INTRAVENOUS | Status: AC
  Administered 2022-06-09: 3000 [IU] via INTRAVENOUS
  Filled 2022-06-09: qty 3000

## 2022-06-09 MED ORDER — SODIUM CHLORIDE (PF) 0.9 % IJ SOLN
INTRAMUSCULAR | Status: AC
  Filled 2022-06-09: qty 50

## 2022-06-09 MED ORDER — ACETAMINOPHEN 325 MG PO TABS: 650.0000 mg | ORAL_TABLET | Freq: Four times a day (QID) | ORAL | Status: DC | PRN

## 2022-06-09 MED ORDER — CHLORHEXIDINE GLUCONATE CLOTH 2 % EX PADS: 6.0000 | MEDICATED_PAD | Freq: Every day | CUTANEOUS | Status: DC

## 2022-06-09 MED ORDER — CARVEDILOL 12.5 MG PO TABS
12.5000 mg | ORAL_TABLET | Freq: Two times a day (BID) | ORAL | Status: DC
  Administered 2022-06-09: 12.5 mg via ORAL
  Filled 2022-06-09: qty 1

## 2022-06-09 MED ORDER — NITROGLYCERIN 0.4 MG SL SUBL: 0.4000 mg | SUBLINGUAL_TABLET | SUBLINGUAL | Status: DC | PRN

## 2022-06-09 NOTE — Progress Notes (Signed)
ANTICOAGULATION CONSULT NOTE - follow up  Pharmacy Consult for heparin Indication: chest pain/ACS  Allergies  Allergen Reactions   Bee Venom Anaphylaxis, Shortness Of Breath, Swelling and Other (See Comments)    Swelling at site (reaction to bees and wasps)   Amlodipine Swelling and Other (See Comments)    Swelling of the ankles and hands    Levofloxacin Other (See Comments)    Joint pain    Alendronate Other (See Comments)    Joint pains / hypercalcemia    Hctz [Hydrochlorothiazide] Palpitations and Other (See Comments)    Sweating     Patient Measurements: Height: 5' 3.5" (161.3 cm) Weight: 75.5 kg (166 lb 7.2 oz) IBW/kg (Calculated) : 53.55TBW 81.6 Ht 63.5 inches Heparin Dosing Weight: 64.8 kg  Vital Signs: Temp: 98.5 F (36.9 C) (07/29 1600) Temp Source: Oral (07/29 1600) BP: 136/78 (07/29 1850) Pulse Rate: 71 (07/29 2123)  Labs: Recent Labs    06/09/22 0742 06/09/22 0744 06/09/22 0915 06/09/22 1625 06/09/22 2054  HGB 13.2  --   --   --   --   HCT 41.0  --   --   --   --   PLT 276  --   --   --   --   HEPARINUNFRC  --   --   --   --  0.30  CREATININE  --  1.45*  --   --   --   TROPONINIHS 618*  --  694* 439*  --      Estimated Creatinine Clearance: 31.5 mL/min (A) (by C-G formula based on SCr of 1.45 mg/dL (H)).   Medical History: Past Medical History:  Diagnosis Date   Anemia    was while doing chemo   Anxiety    Asthma    Chemotherapy-induced neuropathy ([place]) 11/01/2015   Claustrophobia    COPD (chronic obstructive pulmonary disease) ([place])    Depression    Encounter for antineoplastic chemotherapy 02/09/2016   Glaucoma    Headache    prior to menopause   Heart murmur    History of echocardiogram    Echo 2/17: EF 93-11%, grade 1 diastolic dysfunction, mild MR, trivial pericardial effusion   History of nuclear stress test    Myoview 2/17: no ischemia or scar, EF 79%; low risk   History of radiation therapy 10/30/16-11/06/16   Hyperlipidemia     Hypertension    Insomnia 05/16/2016   Non-small cell carcinoma of right lung, stage 2 ([place]) 10/03/2015   Radiation 02/29/16-04/10/16   50.4 Gy to right central chest   Renal cell carcinoma ([place])    L nephrectomy  in 2012    Assessment: 81 YO F with chest pain & emesis. Pharmacy consulted to dose heparin for ACS/STEMI. No anticoagulants PTA. CBC WNL, SCr 1.45 Troponin 618, 694  1st HL 0.3, low end of therapeutic Per RN no bleeding or interruptions  Goal of Therapy:  Heparin level 0.3-0.7 units/ml Monitor platelets by anticoagulation protocol: Yes   Plan:  Increase Heparin drip to 950 units/hr Check heparin level with am labs Daily CBC & heparin level while on heparin  Dolly Rias RPh 06/09/2022, 9:54 PM

## 2022-06-09 NOTE — Consult Note (Signed)
Cardiology Consultation:   Patient ID: Tammy Ball MRN: [id number]; DOB: Aug 08, 1941  Admit date: 06/09/2022 Date of Consult: 06/09/2022  PCP:  Horald Pollen, MD   [facility] Providers Cardiologist:  None        Patient Profile:   Tammy Ball is a 81 y.o. female with a hx of Stage IIIA NSCLC, SCC RUL, COPD on home O2  who is being seen 06/09/2022 for the evaluation of elevated troponin at the request of Dr. Olevia Bowens.  History of Present Illness:   Tammy Ball presents today after feeling nauseous and vomiting. A couple days prior she noted to her family that she was feeling nauseous. The following day she went to work and had no symptoms. She denied angina or DOE. She then took a nap and when she woke up , she felt nauseous and vomited. She felt warm with a temp of 100.50F. She felt weak. She was noted to have abd tenderness on exam. She is HDS. A troponin was checked and 618->694. She was given asa 325 load and placed on a heparin gtt. EKG shows borderline RVH features, no significant ischemic changes. CT PE showed LAD CAC. No PE. CT Abd/pelvis showed edematous gallbladder. No signs of acute cholecystitis.  C/f intrinsic liver dx. RUQ [place] did not show signs of acute cholecystitis. She is hypertensive. She's in normal rhythm. She saw Dr. Gwenlyn Found in 2014 for CVD risk mitigation. She saw Richardson Dopp in 2017. She had  normal echo and SPECT at that time.   Past Medical History:  Diagnosis Date   Anemia    was while doing chemo   Anxiety    Asthma    Chemotherapy-induced neuropathy ([place]) 11/01/2015   Claustrophobia    COPD (chronic obstructive pulmonary disease) ([place])    Depression    Encounter for antineoplastic chemotherapy 02/09/2016   Glaucoma    Headache    prior to menopause   Heart murmur    History of echocardiogram    Echo 2/17: EF 31-51%, grade 1 diastolic dysfunction, mild MR, trivial pericardial effusion   History of nuclear stress test    Myoview 2/17: no ischemia  or scar, EF 79%; low risk   History of radiation therapy 10/30/16-11/06/16   Hyperlipidemia    Hypertension    Insomnia 05/16/2016   Non-small cell carcinoma of right lung, stage 2 ([place]) 10/03/2015   Radiation 02/29/16-04/10/16   50.4 Gy to right central chest   Renal cell carcinoma ([place])    L nephrectomy  in 2012    Past Surgical History:  Procedure Laterality Date   BACK SURGERY     cervical 1991   EYE SURGERY     IR GENERIC HISTORICAL  01/16/2017   IR RADIOLOGIST EVAL & MGMT 01/16/2017 [facility]   IR GENERIC HISTORICAL  01/31/2017   IR FLUORO GUIDED NEEDLE PLC ASPIRATION/INJECTION LOC 01/31/2017 Luanne Bras, MD [facility]   IR GENERIC HISTORICAL  01/31/2017   IR VERTEBROPLASTY CERV/THOR BX INC UNI/BIL INC/INJECT/IMAGING 01/31/2017 Luanne Bras, MD [facility]   IR GENERIC HISTORICAL  01/31/2017   IR VERTEBROPLASTY EA ADDL (T&LS) BX INC UNI/BIL INC INJECT/IMAGING 01/31/2017 Luanne Bras, MD [facility]   kidney cancer     NEPHRECTOMY     SPINE SURGERY     TUBAL LIGATION     VIDEO ASSISTED THORACOSCOPY (VATS)/WEDGE RESECTION Right 01/18/2016   Procedure: VIDEO ASSISTED THORACOSCOPY (VATS)/LUNG RESECTION, THOROCOTOMY, RIGHT UPPER LOBECTOMY, LYMPH NODE DISSECTION, PLACEMENT OF ON Q;  Surgeon:  [name], MD;  Location: [facility];  Service: Thoracic;  Laterality: Right;   VIDEO BRONCHOSCOPY Bilateral 09/20/2015   Procedure: VIDEO BRONCHOSCOPY WITHOUT FLUORO;  Surgeon: Rigoberto Noel, MD;  Location: [facility];  Service: Cardiopulmonary;  Laterality: Bilateral;   VIDEO BRONCHOSCOPY N/A 01/18/2016   Procedure: VIDEO BRONCHOSCOPY;  Surgeon: [name], MD;  Location: [facility] OR;  Service: Thoracic;  Laterality: N/A;     Home Medications:  Prior to Admission medications   Medication Sig Start Date End Date Taking? Authorizing Provider  acetaminophen (TYLENOL) 500 MG tablet Take 500 mg by mouth every 6 (six) hours as needed for headache (pain). Reported on 05/24/2016    Yes [provider]  albuterol (PROVENTIL) (2.5 MG/3ML) 0.083% nebulizer solution Take 3 mLs (2.5 mg total) by nebulization every 6 (six) hours as needed for wheezing or shortness of breath. 01/09/22  Yes Collene Gobble, MD  albuterol (VENTOLIN HFA) 108 (90 Base) MCG/ACT inhaler Inhale 1-2 puffs into the lungs every 6 (six) hours as needed for wheezing or shortness of breath. 10/25/21  Yes [name], Ines Bloomer, MD  Ascorbic Acid (VITAMIN C) 1000 MG tablet Take 1,000 mg by mouth every morning.   Yes [provider]  aspirin EC 81 MG tablet Take 81 mg by mouth every morning.   Yes [provider]  atorvastatin (LIPITOR) 40 MG tablet TAKE 1 TABLET BY MOUTH EVERY DAY Patient taking differently: Take 40 mg by mouth every morning. 05/08/22  Yes [name], Ines Bloomer, MD  Calcium Carbonate Antacid (TUMS PO) Take 1 tablet by mouth every evening.   Yes [provider]  Carboxymethylcellulose Sodium (ARTIFICIAL TEARS OP) Place 1 drop into both eyes daily as needed (dry eyes).   Yes [provider]  carvedilol (COREG) 12.5 MG tablet Take 1 tablet (12.5 mg total) by mouth 2 (two) times daily. 09/06/21 06/11/22 Yes [name], Ines Bloomer, MD  Cholecalciferol (VITAMIN D3 PO) Take 1 tablet by mouth every morning.   Yes [provider]  diclofenac Sodium (VOLTAREN) 1 % GEL Apply 1 Application topically at bedtime as needed (pain).   Yes [provider]  Fluticasone-Umeclidin-Vilant (TRELEGY ELLIPTA) 100-62.5-25 MCG/ACT AEPB Inhale 1 puff into the lungs daily. 01/09/22  Yes Collene Gobble, MD  Guaifenesin [facility] MAXIMUM STRENGTH) 1200 MG TB12 Take 1,200 mg by mouth every morning.   Yes [provider]  losartan (COZAAR) 100 MG tablet TAKE 1 TABLET BY MOUTH EVERY DAY Patient taking differently: Take 50 mg by mouth 2 (two) times daily. 04/26/22  Yes [name], Ines Bloomer, MD  Multiple Vitamins-Minerals (ZINC PO) Take 1 tablet by mouth every morning.    Yes [provider]  Omega-3 Fatty Acids (FISH OIL) 1200 MG CAPS Take 1,200 mg by mouth every morning.   Yes [provider]  OVER THE COUNTER MEDICATION Apply 1 Application topically See admin instructions. Original vicks - apply topically to knees daily prn for pain   Yes [provider]  OXYGEN Inhale 2 L into the lungs as needed (to keep SATS at or above 90).   Yes [provider]  sodium chloride (OCEAN) 0.65 % SOLN nasal spray Place 1 spray into both nostrils daily as needed for congestion.   Yes [provider]  traMADol (ULTRAM) 50 MG tablet Take 1 tablet (50 mg total) by mouth every 6 (six) hours as needed (BACK PAIN). 09/06/21  Yes Horald Pollen, MD    Inpatient Medications: Scheduled Meds:  [START ON  06/10/2022] aspirin EC  81 mg Oral Daily   carvedilol  12.5 mg Oral BID   losartan  50 mg Oral BID   pantoprazole (PROTONIX) IV  40 mg Intravenous Daily   sodium chloride (PF)       Continuous Infusions:  heparin 800 Units/hr (06/09/22 1256)   PRN Meds: acetaminophen **OR** acetaminophen, nitroGLYCERIN, ondansetron (ZOFRAN) IV, sodium chloride (PF), traMADol  Allergies:    Allergies  Allergen Reactions   Bee Venom Anaphylaxis, Shortness Of Breath, Swelling and Other (See Comments)    Swelling at site (reaction to bees and wasps)   Amlodipine Swelling and Other (See Comments)    Swelling of the ankles and hands    Levofloxacin Other (See Comments)    Joint pain    Alendronate Other (See Comments)    Joint pains / hypercalcemia    Hctz [Hydrochlorothiazide] Palpitations and Other (See Comments)    Sweating     Social History:   Social History   Socioeconomic History   Marital status: Widowed    Spouse name: Not on file   Number of children: 4   Years of education: Not on file   Highest education level: High school graduate  Occupational History   Not on file  Tobacco Use   Smoking status: Former    Packs/day:  1.00    Years: 50.00    Total pack years: 50.00    Types: Cigarettes    Quit date: 03/16/2011    Years since quitting: 11.2   Smokeless tobacco: Never   Tobacco comments:    last 4-5 years of smoking, smoked 0.5 pack/day   Vaping Use   Vaping Use: Never used  Substance and Sexual Activity   Alcohol use: No    Alcohol/week: 0.0 standard drinks of alcohol   Drug use: No   Sexual activity: Not Currently  Other Topics Concern   Not on file  Social History Narrative   Marital status: divorced; not dating in 2019.      Children: 4 children; 3 grandchildren adult; 4 gg.      Lives: alone in house      Employment: part-time work; substance abuse counselor; [employer].      Tobacco: quit smoking 2012; smoked 45 years      Alcohol: none      Drugs: none      Exercise: none in 2019; due to LEFT sciatica.      ADLs: independent with ADLs; drives.       Advanced Directives: YES: HCPOA: Nicholas Martinez/son.  FULL CODE but no prolonged measures.      Occupation: Substance Abuse Estate agent   No exercise** Merged History Encounter **       ** Data from: 12/14/11 Enc Dept: [facility]       ** Data from: 12/17/11 Enc Dept: [facility]   Substance abuse counselor   Husband deceased   4 great grandchildren   Son works in same substance abuse counseling center as patient   Social Determinants of Radio broadcast assistant Strain: Low Risk  (05/28/2022)   Overall Financial Resource Strain (CARDIA)    Difficulty of Paying Living Expenses: Not hard at all  Food Insecurity: No Food Insecurity (05/28/2022)   Hunger Vital Sign    Worried About Running Out of Food in the Last Year: Never true    Ran Out of Food in the Last Year: Never true  Transportation Needs: No Transportation Needs (05/28/2022)   PRAPARE - Hydrologist (Medical): No    Lack of Transportation (Non-Medical): No  Physical Activity: Insufficiently  Active (05/28/2022)   Exercise Vital Sign    Days of Exercise per Week: 7 days    Minutes of Exercise per Session: 20 min  Stress: No Stress Concern Present (05/28/2022)   [place]    Feeling of Stress : Not at all  Social Connections: Moderately Integrated (05/18/2021)   Social Connection and Isolation Panel [NHANES]    Frequency of Communication with Friends and Family: More than three times a week    Frequency of Social Gatherings with Friends and Family: More than three times a week    Attends Religious Services: More than 4 times per year    Active Member of [employer] or Organizations: No    Attends Music therapist: More than 4 times per year    Marital Status: Divorced  Human resources officer Violence: Not At Risk (05/18/2021)   Humiliation, Afraid, Rape, and Kick questionnaire    Fear of Current or Ex-Partner: No    Emotionally Abused: No    Physically Abused: No    Sexually Abused: No    Family History:    Family History  Problem Relation Age of Onset   Heart disease Sister    Obesity Brother    Heart attack Daughter 37       s/p CABG   Glaucoma Daughter    Breast cancer Sister    Birth defects Sister    Cancer Mother        Bladder Cancer   Hypertension Mother    CAD Mother 32   Cancer Maternal Grandmother      ROS:  Please see the history of present illness.   All other ROS reviewed and negative.     Physical Exam/Data:   Vitals:   06/09/22 1145 06/09/22 1200 06/09/22 1215 06/09/22 1230  BP:  (!) 160/138  (!) 168/92  Pulse: 73 72 92 69  Resp: 19 20 (!) 24 19  Temp:      TempSrc:      SpO2: 96% 96% (!) 79% 96%   No intake or output data in the 24 hours ending 06/09/22 1327    05/28/2022    3:26 PM 01/09/2022    9:39 AM 12/25/2021    8:55 AM  Last 3 Weights  Weight (lbs) 180 lb 182 lb 6.4 oz 183 lb 8 oz  Weight (kg) 81.647 kg 82.736 kg 83.235 kg     There is no height or weight on  file to calculate BMI.  General:  Well nourished, well developed, in no acute distress HEENT: normal Neck: no JVD Vascular: No carotid bruits; Distal pulses 2+ bilaterally Cardiac:  normal S1, S2; RRR; no murmur  Lungs:  clear to auscultation bilaterally, no wheezing, rhonchi or rales  Abd: non distended Ext: no edema Musculoskeletal:  No deformities, BUE and BLE strength normal and equal Skin: warm and dry  Neuro:  CNs 2-12 intact, no focal abnormalities noted Psych:  Normal affect   EKG:  The EKG was personally reviewed and demonstrates:  NSR, borderline RVH features   Telemetry:  Telemetry was personally reviewed and demonstrates:  NSR  Relevant CV Studies: TTE 2017 Study Conclusions   - Left ventricle: The cavity size was normal. Wall thickness was    normal. Systolic function was normal.  The estimated ejection    fraction was in the range of 60% to 65%. Doppler parameters are    consistent with abnormal left ventricular relaxation (grade 1    diastolic dysfunction).  - Mitral valve: There was mild regurgitation.  - Pericardium, extracardiac: A trivial pericardial effusion was    identified.   Myoview SPECT The left ventricular ejection fraction is hyperdynamic (>65%). Nuclear stress EF: 79%. There was no ST segment deviation noted during stress. The study is normal.   Normal stress nuclear study with no ischemia or infarction; EF 79 with normal wall motion.  Laboratory Data:  High Sensitivity Troponin:   Recent Labs  Lab 06/09/22 0742 06/09/22 0915  TROPONINIHS 618* 694*     Chemistry Recent Labs  Lab 06/09/22 0744  NA 131*  K 4.9  CL 97*  CO2 23  GLUCOSE 124*  BUN 30*  CREATININE 1.45*  CALCIUM 10.4*  GFRNONAA 37*  ANIONGAP 11    Recent Labs  Lab 06/09/22 0744  PROT 6.8  ALBUMIN 3.8  AST 25  ALT 21  ALKPHOS 60  BILITOT 0.7   Lipids No results for input(s): "CHOL", "TRIG", "HDL", "LABVLDL", "LDLCALC", "CHOLHDL" in the last 168 hours.   Hematology Recent Labs  Lab 06/09/22 0742  WBC 7.2  RBC 4.47  HGB 13.2  HCT 41.0  MCV 91.7  MCH 29.5  MCHC 32.2  RDW 18.6*  PLT 276   Thyroid No results for input(s): "TSH", "FREET4" in the last 168 hours.  BNPNo results for input(s): "BNP", "PROBNP" in the last 168 hours.  DDimer No results for input(s): "DDIMER" in the last 168 hours.   Radiology/Studies:  US Abdomen Limited RUQ (LIVER/GB)  Result Date: 06/09/2022 CLINICAL DATA:  81 year old female with acute abdominal pain. EXAM: ULTRASOUND ABDOMEN LIMITED RIGHT UPPER QUADRANT COMPARISON:  06/09/2022 CT FINDINGS: Gallbladder: There is no evidence of cholelithiasis, gallbladder wall thickening or sonographic Murphy sign. A small amount of pericholecystic fluid is identified. Common bile duct: Diameter: 6 mm. There is no evidence of intrahepatic or extrahepatic biliary dilatation. Liver: No focal lesion identified. Within normal limits in parenchymal echogenicity. Portal vein is patent on color Doppler imaging with normal direction of blood flow towards the liver. Other: None. IMPRESSION: 1. Small amount of nonspecific pericholecystic fluid. No evidence of cholelithiasis or other signs of acute cholecystitis. 2. No hepatic abnormalities identified.  No biliary dilatation. Electronically Signed   By: Margarette Canada M.D.   On: 06/09/2022 11:54   CT Angio Chest PE W and/or Wo Contrast  Result Date: 06/09/2022 CLINICAL DATA:  Suspected pulmonary embolus. Epigastric pain. Nausea vomiting. Shortness of breath. EXAM: CT ANGIOGRAPHY CHEST WITH CONTRAST TECHNIQUE: Multidetector CT imaging of the chest was performed using the standard protocol during bolus administration of intravenous contrast. Multiplanar CT image reconstructions and MIPs were obtained to evaluate the vascular anatomy. RADIATION DOSE REDUCTION: This exam was performed according to the departmental dose-optimization program which includes automated exposure control, adjustment of  the mA and/or kV according to patient size and/or use of iterative reconstruction technique. CONTRAST:  29mL OMNIPAQUE IOHEXOL 350 MG/ML SOLN COMPARISON:  CT scan of the chest December 08, 2021 FINDINGS: Cardiovascular: Atherosclerotic changes are identified in the nonaneurysmal thoracic aorta. Left coronary artery atherosclerotic change. The heart is stable with mild cardiomegaly. The main pulmonary artery measures up to 3.5 cm, stable. No pulmonary emboli. Mediastinum/Nodes: Tiny pleural effusions. No pericardial effusion. The chest wall is stable and unremarkable. The thyroid and esophagus are normal. No  adenopathy. Lungs/Pleura: Right upper lobectomy. Remaining central airways are normal. No pneumothorax. Stable right perihilar radiation fibrosis. Stable emphysematous changes. Stable 2 mm subpleural nodule on the left on series 4, image 27. Radiation induced consolidation in the left base is stable. No acute infiltrate. No new nodule or mass. Upper Abdomen: No acute abnormality. Musculoskeletal: Stable compression fractures in the upper thoracic spine with vertebroplasties. No interval bony change. Review of the MIP images confirms the above findings. IMPRESSION: 1. No pulmonary emboli.  No acute abnormality. 2. Emphysema. 3. Atherosclerotic changes in the nonaneurysmal aorta. Coronary artery disease in the left coronary arteries. 4. The main pulmonary artery measures 3.5 cm which is mildly enlarged raising the possibility of pulmonary arterial hypertension. 5. Right upper lobectomy with post radiation change on the right as above. 6. Consolidation in the left base is stable consistent with previously reported radiation induced consolidation. 7. Stable compression fractures and vertebroplasties in the upper thoracic spine. Aortic Atherosclerosis (ICD10-I70.0) and Emphysema (ICD10-J43.9). Electronically Signed   By: Dorise Bullion III M.D.   On: 06/09/2022 10:35   CT ABDOMEN PELVIS W CONTRAST  Result Date:  06/09/2022 CLINICAL DATA:  81 year old female with history of nausea, vomiting and lower abdominal pain since yesterday. EXAM: CT ABDOMEN AND PELVIS WITH CONTRAST TECHNIQUE: Multidetector CT imaging of the abdomen and pelvis was performed using the standard protocol following bolus administration of intravenous contrast. RADIATION DOSE REDUCTION: This exam was performed according to the departmental dose-optimization program which includes automated exposure control, adjustment of the mA and/or kV according to patient size and/or use of iterative reconstruction technique. CONTRAST:  82mL OMNIPAQUE IOHEXOL 350 MG/ML SOLN COMPARISON:  CT the abdomen and pelvis 11/05/2020. FINDINGS: Lower chest: Atherosclerotic calcifications in the descending thoracic aorta. Chronic nodular area of architectural distortion in the base of the left lower lobe, stable compared to the prior examination, most compatible with an area of chronic post infectious or inflammatory scarring. Hepatobiliary: No suspicious cystic or solid hepatic lesions. No intra or extrahepatic biliary ductal dilatation. Mild periportal edema. No calcified gallstones are noted within the gallbladder. Gallbladder wall does appear edematous. No pericholecystic inflammatory changes. Pancreas: No pancreatic mass. No pancreatic ductal dilatation. No pancreatic or peripancreatic fluid collections or inflammatory changes. Spleen: Unremarkable. Adrenals/Urinary Tract: Left kidney is not visualized, either congenitally or surgically absent. Right kidney and bilateral adrenal glands are normal in appearance. No hydroureteronephrosis. Urinary bladder is unremarkable in appearance. Stomach/Bowel: The appearance of the stomach is normal. There is no pathologic dilatation of small bowel or colon. Normal appendix. Vascular/Lymphatic: Extensive atherosclerosis in the abdominal aorta and pelvic vasculature, without evidence of aneurysm or dissection in the abdominal or pelvic  vessels. No lymphadenopathy noted in the abdomen or pelvis. Reproductive: Uterus and ovaries are atrophic. Other: Small volume of ascites. No pneumoperitoneum. Musculoskeletal: There are no aggressive appearing lytic or blastic lesions noted in the visualized portions of the skeleton. IMPRESSION: 1. Periportal edema. Gallbladder wall is also edematous, without overt imaging findings to suggest an acute cholecystitis (and no definite calcified gallstones). Findings suggest intrinsic liver disease, and correlation with liver function tests is recommended. 2. Small volume of ascites. 3. Aortic atherosclerosis. 4. Additional incidental findings, as above. Electronically Signed   By: Vinnie Langton M.D.   On: 06/09/2022 10:30   DG Chest 2 View  Result Date: 06/09/2022 CLINICAL DATA:  Epigastric pain, chest pain. EXAM: CHEST - 2 VIEW COMPARISON:  November 05, 2020 and December 08, 2021 FINDINGS: EKG leads project over the chest.  Cardiomediastinal contours are stable. Mild cardiac enlargement. No lobar consolidative process. Graded opacity at the LEFT and RIGHT lung base. Added density behind the LEFT heart border grossly similar to scout image from the January CT. No pneumothorax. Vertebroplasty changes in the upper thoracic spine. No gross acute process of skeletal structures to the extent evaluated. IMPRESSION: 1. Graded opacity at the LEFT and RIGHT lung base more likely related overlapping soft tissues. Small effusions are possible. No sign of lobar consolidation. 2. Nodular density in the retrocardiac region in the LEFT lower chest likely related to post treatment changes documented on previous chest CT. Electronically Signed   By: Zetta Bills M.D.   On: 06/09/2022 08:12     Assessment and Plan:   Troponin elevation: She notes feelings of nausea/low grade fever and vomiting with acute onset. She had no prior anginal symptoms prior to this event. She does have CVD risk factors with age and smoking. EKG  shows evidence of RVH expected with COPD and PAH. No significant ST changes. No Stemi. CT PE showed LAD dx. -recommend trend troponins -continue IV heparin -continue asa 81 mg daily -s/p asa 325 mg daily -increased home carvedilol 12.5 mg BID to 25 mg BID -continue home atorvastatin 40 mg daily (LDL 68 mg/dL 05/2020) -TTE is pending -can consider LHC on Monday if no other causes identified for her acute presentation. This can be her anginal equivalent  Non small cell lung CA: She is not on an immune checkpoint inhibitor. No recurrence of her malignancy; in remission  HTN: continue home losartan. Increased coreg 25 mg BID. High in the setting of discomfort.    Risk Assessment/Risk Scores:       For questions or updates, please contact [facility] Please consult [email or web address] for contact info under    Signed, Janina Mayo, MD  06/09/2022 1:27 PM

## 2022-06-09 NOTE — ED Triage Notes (Signed)
Pt reports epigastric pain and N/V that began yesterday. Pt also endorses SHOB. SpO2 is 72% RA in triage. Pt placed on 6L and came up to 94%.

## 2022-06-09 NOTE — H&P (Signed)
History and Physical    Patient: Tammy Ball:[id number] DOB: [birth year] DOA: 06/09/2022 DOS: the patient was seen and examined on 06/09/2022 PCP: Horald Pollen, MD  Patient coming from: Home  Chief Complaint:  Chief Complaint  Patient presents with   Chest Pain   Emesis   HPI: Tammy Ball is a 81 y.o. female with medical history significant of non-small cell right lung carcinoma, renal cell carcinoma with left nephrectomy, chemo-induced anemia, anxiety, depression, claustrophobia, asthma/COPD, glaucoma, headaches, grade 1 diastolic dysfunction, hyperlipidemia, hypertension, insomnia who presented to the emergency department due to substernal/epigastric pain, nausea and vomiting.  Patient stated that on Thursday she did not feel well and was nauseous.  She went to work yesterday morning, took her regular afternoon nap which is about an hour and when she woke up noticed that she had slept about 3-1/2 hours and did not feel well.  She later threw up about 4 times in the evening.  She developed some epigastric discomfort.  This morning she woke up and had a 100.1 F fever and her abdominal pain was more intense.  She was brought to the emergency department.  Denied hematemesis, diarrhea, melena or hematochezia.  She last had a bowel movement on Thursday as she did not eat much yesterday. No  palpitations, diaphoresis, PND, orthopnea or pitting edema of the lower extremities. No chills or night sweats. No sore throat, rhinorrhea, dyspnea, wheezing or hemoptysis.    No flank pain, dysuria, frequency or hematuria.  No polyuria, polydipsia, polyphagia or blurred vision.  ED course: Initial vital signs were temperature 99.1 F, pulse 80, respiration 18, BP 141/86 mmHg O2 sat 97%.  The patient received aspirin 324 mg p.o., Maalox plus viscous lidocaine, famotidine 20 mg IVPB, ondansetron 4 mg IVP, normal saline 500 mL bolus and was started on a continuous heparin infusion.  Lab work: Her CBC  showed a white count 7.2, hemoglobin 13.2 g/dL and platelets 276.  CMP showed a sodium 131, potassium 4.9, chloride 97 and CO2 23 mmol/L.Glucose 124, BUN 30, creatinine 1.45 and calcium 10.4 mg/dL.  LFTs were normal.  Troponin was 618 and then 694 ng/mL.  EKG with no significant ST changes but positive RVH criteria.  Imaging: Two-view chest radiograph with Jeralyn Ruths opacity likely due to related overlapping soft tissues, no signs of lobar consolidation.  There was nodular density in the retrocardiac region likely due to posttreatment changes.  CTA chest with no pulmonary embolism.  There was emphysema atherosclerosis of the aorta.  CAD in the left coronary arteries.  Right upper lobectomy.  CTA abdomen with periportal edema and gallbladder edema, but no definite stones.  RUQ ultrasound did not show any evidence of cholelithiasis or acute cholecystitis.  Please see images and full radiology report for further details.   Review of Systems: As mentioned in the history of present illness. All other systems reviewed and are negative.  Past Medical History:  Diagnosis Date   Anemia    was while doing chemo   Anxiety    Asthma    Chemotherapy-induced neuropathy ([place]) 11/01/2015   Claustrophobia    COPD (chronic obstructive pulmonary disease) ([place])    Depression    Encounter for antineoplastic chemotherapy 02/09/2016   Glaucoma    Headache    prior to menopause   Heart murmur    History of echocardiogram    Echo 2/17: EF 63-87%, grade 1 diastolic dysfunction, mild MR, trivial pericardial effusion   History of nuclear stress test  Myoview 2/17: no ischemia or scar, EF 79%; low risk   History of radiation therapy 10/30/16-11/06/16   Hyperlipidemia    Hypertension    Insomnia 05/16/2016   Non-small cell carcinoma of right lung, stage 2 ([place]) 10/03/2015   Radiation 02/29/16-04/10/16   50.4 Gy to right central chest   Renal cell carcinoma ([place])    L nephrectomy  in 2012   Past Surgical History:   Procedure Laterality Date   BACK SURGERY     cervical 1991   EYE SURGERY     IR GENERIC HISTORICAL  01/16/2017   IR RADIOLOGIST EVAL & MGMT 01/16/2017 [facility]   IR GENERIC HISTORICAL  01/31/2017   IR FLUORO GUIDED NEEDLE PLC ASPIRATION/INJECTION LOC 01/31/2017 Luanne Bras, MD [facility]   IR GENERIC HISTORICAL  01/31/2017   IR VERTEBROPLASTY CERV/THOR BX INC UNI/BIL INC/INJECT/IMAGING 01/31/2017 Luanne Bras, MD [facility]   IR GENERIC HISTORICAL  01/31/2017   IR VERTEBROPLASTY EA ADDL (T&LS) BX INC UNI/BIL INC INJECT/IMAGING 01/31/2017 Luanne Bras, MD [facility]   kidney cancer     NEPHRECTOMY     SPINE SURGERY     TUBAL LIGATION     VIDEO ASSISTED THORACOSCOPY (VATS)/WEDGE RESECTION Right 01/18/2016   Procedure: VIDEO ASSISTED THORACOSCOPY (VATS)/LUNG RESECTION, THOROCOTOMY, RIGHT UPPER LOBECTOMY, LYMPH NODE DISSECTION, PLACEMENT OF ON Q;  Surgeon: [name], MD;  Location: [facility];  Service: Thoracic;  Laterality: Right;   VIDEO BRONCHOSCOPY Bilateral 09/20/2015   Procedure: VIDEO BRONCHOSCOPY WITHOUT FLUORO;  Surgeon: Rigoberto Noel, MD;  Location: [facility];  Service: Cardiopulmonary;  Laterality: Bilateral;   VIDEO BRONCHOSCOPY N/A 01/18/2016   Procedure: VIDEO BRONCHOSCOPY;  Surgeon: [name], MD;  Location[facility] OR;  Service: Thoracic;  Laterality: N/A;   Social History:  reports that she quit smoking about 11 years ago. Her smoking use included cigarettes. She has a 50.00 pack-year smoking history. She has never used smokeless tobacco. She reports that she does not drink alcohol and does not use drugs.  Allergies  Allergen Reactions   Bee Venom Anaphylaxis, Shortness Of Breath, Swelling and Other (See Comments)    Swelling at site (reaction to bees and wasps)   Amlodipine Swelling and Other (See Comments)    Swelling of the ankles and hands    Levofloxacin Other (See Comments)    Joint pain    Alendronate Other (See Comments)    Joint pains  / hypercalcemia    Hctz [Hydrochlorothiazide] Palpitations and Other (See Comments)    Sweating     Family History  Problem Relation Age of Onset   Heart disease Sister    Obesity Brother    Heart attack Daughter 40       s/p CABG   Glaucoma Daughter    Breast cancer Sister    Birth defects Sister    Cancer Mother        Bladder Cancer   Hypertension Mother    CAD Mother 89   Cancer Maternal Grandmother     Prior to Admission medications   Medication Sig Start Date End Date Taking? Authorizing Provider  acetaminophen (TYLENOL) 500 MG tablet Take 500 mg by mouth every 6 (six) hours as needed for mild pain, moderate pain, fever or headache. Reported on 05/24/2016    [provider]  albuterol (PROVENTIL) (2.5 MG/3ML) 0.083% nebulizer solution Take 3 mLs (2.5 mg total) by nebulization every 6 (six) hours as needed for wheezing or shortness of breath. 01/09/22  Collene Gobble, MD  albuterol (VENTOLIN HFA) 108 (90 Base) MCG/ACT inhaler Inhale 1-2 puffs into the lungs every 6 (six) hours as needed for wheezing or shortness of breath. 10/25/21   Horald Pollen, MD  Ascorbic Acid (VITAMIN C) 1000 MG tablet Take 1,000 mg by mouth daily.    [provider]  aspirin EC 81 MG tablet Take 81 mg by mouth daily.    [provider]  atorvastatin (LIPITOR) 40 MG tablet TAKE 1 TABLET BY MOUTH EVERY DAY 05/08/22   Horald Pollen, MD  calcium carbonate (TUMS EX) 750 MG chewable tablet Chew 1 tablet by mouth daily as needed for heartburn.    [provider]  Carboxymethylcellulose Sodium (ARTIFICIAL TEARS OP) Place 1 drop into both eyes daily as needed (dry eyes).    [provider]  carvedilol (COREG) 12.5 MG tablet Take 1 tablet (12.5 mg total) by mouth 2 (two) times daily. 09/06/21 12/05/21  Horald Pollen, MD  Cholecalciferol (VITAMIN D3 PO) Take 1,000 Units by mouth daily.    [provider]  Fluticasone-Umeclidin-Vilant  (TRELEGY ELLIPTA) 100-62.5-25 MCG/ACT AEPB Inhale 1 puff into the lungs daily. 01/09/22   Collene Gobble, MD  Guaifenesin [facility] MAXIMUM STRENGTH) 1200 MG TB12 Take 1,200 mg by mouth 2 (two) times daily.    [provider]  losartan (COZAAR) 100 MG tablet TAKE 1 TABLET BY MOUTH EVERY DAY 04/26/22   Horald Pollen, MD  OMEGA-3 FATTY ACIDS PO Take 1,200 mg by mouth daily.    [provider]  OXYGEN Inhale 2 L into the lungs continuous.    [provider]  sodium chloride (OCEAN) 0.65 % SOLN nasal spray Place 1 spray into both nostrils daily.    [provider]  traMADol (ULTRAM) 50 MG tablet Take 1 tablet (50 mg total) by mouth every 6 (six) hours as needed (BACK PAIN). 09/06/21   Horald Pollen, MD  Zinc Sulfate (ZINC 15 PO) Take by mouth.    [provider]    Physical Exam: Vitals:   06/09/22 1100 06/09/22 1115 06/09/22 1130 06/09/22 1141  BP: (!) 159/98  (!) 179/99   Pulse: 78 72 68   Resp: 18 (!) 23 14   Temp:    98.3 F (36.8 C)  TempSrc:    Oral  SpO2: 97% 99% 98%    Physical Exam Vitals and nursing note reviewed.  Constitutional:      General: She is awake.     Appearance: Normal appearance. She is well-developed. She is not ill-appearing.  HENT:     Head: Normocephalic.     Nose: No rhinorrhea.     Mouth/Throat:     Mouth: Mucous membranes are moist.  Eyes:     General: No scleral icterus.    Pupils: Pupils are equal, round, and reactive to light.  Neck:     Vascular: No JVD.  Cardiovascular:     Rate and Rhythm: Normal rate and regular rhythm.     Heart sounds: S1 normal and S2 normal.  Pulmonary:     Effort: Pulmonary effort is normal.     Breath sounds: Normal breath sounds. No wheezing, rhonchi or rales.  Abdominal:     General: Bowel sounds are normal.     Palpations: Abdomen is soft.     Tenderness: There is abdominal tenderness in the epigastric area. There is no right CVA tenderness, left CVA  tenderness, guarding or rebound.  Musculoskeletal:  Cervical back: Neck supple.     Right lower leg: No edema.     Left lower leg: No edema.  Skin:    General: Skin is warm and dry.  Neurological:     General: No focal deficit present.     Mental Status: She is alert and oriented to person, place, and time.  Psychiatric:        Mood and Affect: Mood normal.        Behavior: Behavior normal. Behavior is cooperative.   Data Reviewed:  There are no new results to review at this time.  Assessment and Plan: Principal Problem:   ACS (acute coronary syndrome) ([place]) Observation/SDU. Supplemental oxygen as needed. Continue heparin infusion. Continue daily aspirin. Begin low-dose metoprolol twice daily. Morphine as needed for pain. Trend troponin level. Serial EKG. Obtain echocardiogram. Cardiology   Active Problems:   Epigastric pain Continue Maalox plus viscous lidocaine. Ondansetron 4 mg IVP every 6 hours as needed. Hydromorphone 0.5 mg q 2-3 hr for severe pain. Pantoprazole 40 mg IVP daily.    Hyponatremia Secondary to earlier GI losses. Received small bolus in ED. Follow sodium level.    Hypercalcemia High normal after correction to albumin. Follow-up calcium level in AM.    Essential hypertension Carvedilol was increased to 25 mg p.o. twice daily. Continue losartan 50 mg p.o. twice daily.    COPD GOLD B-C Supplemental oxygen as needed.   Continue Trelegy Ellipta or formulary equivalent. Short acting bronchodilators as needed.    Spondylolisthesis of lumbar region Analgesics and muscle relaxants as needed.    Hyperlipidemia Continue atorvastatin 40 mg p.o. daily.    Atherosclerosis of aorta (HCC) On atorvastatin.      Advance Care Planning:   Code Status: Full Code   Consults:   Family Communication:   Severity of Illness: The appropriate patient status for this patient is INPATIENT. Inpatient status is judged to be reasonable and necessary in  order to provide the required intensity of service to ensure the patient's safety. The patient's presenting symptoms, physical exam findings, and initial radiographic and laboratory data in the context of their chronic comorbidities is felt to place them at high risk for further clinical deterioration. Furthermore, it is not anticipated that the patient will be medically stable for discharge from the hospital within 2 midnights of admission.   * I certify that at the point of admission it is my clinical judgment that the patient will require inpatient hospital care spanning beyond 2 midnights from the point of admission due to high intensity of service, high risk for further deterioration and high frequency of surveillance required.*  Author: Reubin Milan, MD 06/09/2022 12:28 PM  For on call review [email or web address].   This document was prepared using Dragon voice recognition software and may contain some unintended transcription errors.

## 2022-06-09 NOTE — Progress Notes (Signed)
ANTICOAGULATION CONSULT NOTE - Initial Consult  Pharmacy Consult for heparin Indication: chest pain/ACS  Allergies  Allergen Reactions   Bee Venom Anaphylaxis, Shortness Of Breath, Swelling and Other (See Comments)    Swelling at site    Amlodipine Swelling and Other (See Comments)    Swelling of the ankles and hands    Levofloxacin Other (See Comments)    Joint pain    Alendronate     Joint pains / hypercalcemia    Hctz [Hydrochlorothiazide] Palpitations and Other (See Comments)    Sweating     Patient Measurements:  TBW 81.6 Ht 63.5 inches Heparin Dosing Weight: 64.8 kg  Vital Signs: Temp: 98.3 F (36.8 C) (07/29 1141) Temp Source: Oral (07/29 1141) BP: 179/99 (07/29 1130) Pulse Rate: 68 (07/29 1130)  Labs: Recent Labs    06/09/22 0742 06/09/22 0744 06/09/22 0915  HGB 13.2  --   --   HCT 41.0  --   --   PLT 276  --   --   CREATININE  --  1.45*  --   TROPONINIHS 618*  --  694*    Estimated Creatinine Clearance: 32.7 mL/min (A) (by C-G formula based on SCr of 1.45 mg/dL (H)).   Medical History: Past Medical History:  Diagnosis Date   Anemia    was while doing chemo   Anxiety    Asthma    Chemotherapy-induced neuropathy ([place]) 11/01/2015   Claustrophobia    COPD (chronic obstructive pulmonary disease) ([place])    Depression    Encounter for antineoplastic chemotherapy 02/09/2016   Glaucoma    Headache    prior to menopause   Heart murmur    History of echocardiogram    Echo 2/17: EF 48-27%, grade 1 diastolic dysfunction, mild MR, trivial pericardial effusion   History of nuclear stress test    Myoview 2/17: no ischemia or scar, EF 79%; low risk   History of radiation therapy 10/30/16-11/06/16   Hyperlipidemia    Hypertension    Insomnia 05/16/2016   Non-small cell carcinoma of right lung, stage 2 ([place]) 10/03/2015   Radiation 02/29/16-04/10/16   50.4 Gy to right central chest   Renal cell carcinoma ([place])    L nephrectomy  in 2012    Assessment: 81  YO F with chest pain & emesis. Pharmacy consulted to dose heparin for ACS/STEMI. No anticoagulants PTA. CBC WNL, SCr 1.45 Troponin 618, 694  Goal of Therapy:  Heparin level 0.3-0.7 units/ml Monitor platelets by anticoagulation protocol: Yes   Plan:  Heparin bolus 3000 units Heparin drip 800 units/hr Check heparin level 8 hrs after bolus Daily CBC & heparin level while on heparin  Eudelia Bunch, Pharm.D 06/09/2022 12:30 PM

## 2022-06-09 NOTE — Plan of Care (Signed)
  Problem: Activity: Goal: Ability to tolerate increased activity will improve Outcome: Progressing   Problem: Education: Goal: Understanding of cardiac disease, CV risk reduction, and recovery process will improve Outcome: Progressing   Problem: Activity: Goal: Ability to tolerate increased activity will improve Outcome: Progressing   Problem: Clinical Measurements: Goal: Will remain free from infection Outcome: Progressing

## 2022-06-09 NOTE — ED Provider Notes (Signed)
[facility] DEPT Provider Note   CSN: [id number] Arrival date & time: 06/09/22  0702     History  Chief Complaint  Patient presents with   Chest Pain   Emesis    Tammy Ball is a 81 y.o. female w/ hx of stage 2 nonsmall cell carcinoma of the right lung, COPD on 2L oxygen at home "as needed," presenting to the ED with epigastric discomfort, nausea, vomiting, and headache.  Onset was yesterday.  She cannot keep down any pills or food, vomiting, throat feels raw.  Has mild diffuse headache.  Feels weak.  No diarrhea.  Last BM yesterday.    Hx of nephrectomy and right upper lobe pneumonectomy (for cancer).  Last echo on record Feb 2017 EF 60-65%, no significant findings.  Stress test Feb 2017 was normal.  Per oncology eval Dr Mckinley Jewel Feb 2023, pt's repeat CT scans showing no evidence of metastasis and she is under 6 month observation period with repeat CT scans.  HPI     Home Medications Prior to Admission medications   Medication Sig Start Date End Date Taking? Authorizing Provider  acetaminophen (TYLENOL) 500 MG tablet Take 500 mg by mouth every 6 (six) hours as needed for mild pain, moderate pain, fever or headache. Reported on 05/24/2016    [provider]  albuterol (PROVENTIL) (2.5 MG/3ML) 0.083% nebulizer solution Take 3 mLs (2.5 mg total) by nebulization every 6 (six) hours as needed for wheezing or shortness of breath. 01/09/22   Collene Gobble, MD  albuterol (VENTOLIN HFA) 108 (90 Base) MCG/ACT inhaler Inhale 1-2 puffs into the lungs every 6 (six) hours as needed for wheezing or shortness of breath. 10/25/21   Horald Pollen, MD  Ascorbic Acid (VITAMIN C) 1000 MG tablet Take 1,000 mg by mouth daily.    [provider]  aspirin EC 81 MG tablet Take 81 mg by mouth daily.    [provider]  atorvastatin (LIPITOR) 40 MG tablet TAKE 1 TABLET BY MOUTH EVERY DAY 05/08/22   Horald Pollen, MD  calcium  carbonate (TUMS EX) 750 MG chewable tablet Chew 1 tablet by mouth daily as needed for heartburn.    [provider]  Carboxymethylcellulose Sodium (ARTIFICIAL TEARS OP) Place 1 drop into both eyes daily as needed (dry eyes).    [provider]  carvedilol (COREG) 12.5 MG tablet Take 1 tablet (12.5 mg total) by mouth 2 (two) times daily. 09/06/21 12/05/21  Horald Pollen, MD  Cholecalciferol (VITAMIN D3 PO) Take 1,000 Units by mouth daily.    [provider]  Fluticasone-Umeclidin-Vilant (TRELEGY ELLIPTA) 100-62.5-25 MCG/ACT AEPB Inhale 1 puff into the lungs daily. 01/09/22   Collene Gobble, MD  Guaifenesin [facility] MAXIMUM STRENGTH) 1200 MG TB12 Take 1,200 mg by mouth 2 (two) times daily.    [provider]  losartan (COZAAR) 100 MG tablet TAKE 1 TABLET BY MOUTH EVERY DAY 04/26/22   Horald Pollen, MD  OMEGA-3 FATTY ACIDS PO Take 1,200 mg by mouth daily.    [provider]  OXYGEN Inhale 2 L into the lungs continuous.    [provider]  sodium chloride (OCEAN) 0.65 % SOLN nasal spray Place 1 spray into both nostrils daily.    [provider]  traMADol (ULTRAM) 50 MG tablet Take 1 tablet (50 mg total) by mouth every 6 (six) hours as needed (BACK PAIN). 09/06/21   Horald Pollen, MD  Zinc Sulfate (ZINC 15 PO)  Take by mouth.    [provider]      Allergies    Bee venom, Amlodipine, Levofloxacin, Alendronate, and Hctz [hydrochlorothiazide]    Review of Systems   Review of Systems  Physical Exam Updated Vital Signs BP (!) 141/86 (BP Location: Left Arm)   Pulse 80   Temp 99.1 F (37.3 C) (Oral)   Resp 18   SpO2 99%  Physical Exam Constitutional:      General: She is not in acute distress. HENT:     Head: Normocephalic and atraumatic.  Eyes:     Conjunctiva/sclera: Conjunctivae normal.     Pupils: Pupils are equal, round, and reactive to light.  Cardiovascular:     Rate and Rhythm: Normal rate  and regular rhythm.  Pulmonary:     Effort: Pulmonary effort is normal. No respiratory distress.  Abdominal:     General: There is no distension.     Tenderness: There is abdominal tenderness in the epigastric area.  Skin:    General: Skin is warm and dry.  Neurological:     General: No focal deficit present.     Mental Status: She is alert. Mental status is at baseline.  Psychiatric:        Mood and Affect: Mood normal.        Behavior: Behavior normal.     ED Results / Procedures / Treatments   Labs (all labs ordered are listed, but only abnormal results are displayed) Labs Reviewed  BASIC METABOLIC PANEL  CBC  TROPONIN I (HIGH SENSITIVITY)    EKG None  Radiology No results found.  Procedures .Critical Care  Performed by: Wyvonnia Dusky, MD Authorized by: Wyvonnia Dusky, MD   Critical care provider statement:    Critical care time (minutes):  45   Critical care time was exclusive of:  Separately billable procedures and treating other patients   Critical care was necessary to treat or prevent imminent or life-threatening deterioration of the following conditions:  Circulatory failure   Critical care was time spent personally by me on the following activities:  Ordering and performing treatments and interventions, ordering and review of laboratory studies, ordering and review of radiographic studies, pulse oximetry, review of old charts, examination of patient and evaluation of patient's response to treatment   Care discussed with: admitting provider   Comments:     NSTEMI, heparin, repeat ecg     Medications Ordered in ED Medications - No data to display  ED Course/ Medical Decision Making/ A&P Clinical Course as of 06/09/22 1626  Sat Jun 09, 2022  0755 Pt weaned back to 2L [place] baseline levels while I was in the room, satting 95% and breathing comfortably, denies dyspnea [MT]  0904 Pt reporting reflux feeling better after GI cocktail but still having lower  abdominal pain, gas feeling, bloating, no CP.  Repeat ECG stable - no new acute ischemic changes.  Pending repeat troponin level, further workup for NSTEMI including secondary noncardiac causes [MT]  1112 Patient remains "bloated" and has not had a bowel movement in 2 days.  We will obtain a right upper quadrant ultrasound.  She does not appear septic otherwise, reports overall that her pain is significantly improved from this morning.  Her troponin on repeat was mildly more elevated but no significant escalation.  I will consult with cardiology while we are awaiting right upper quadrant ultrasound.  Patient and her son at bedside updated. [MT]  1117 I spoke to Dr Marlou Porch  from cardiology who advises he will come evaluate the patient.  He agrees with further work-up for possible biliary disease, if there is no acute indication of cholecystitis or surgical emergency, he also agrees with initiating heparin while the patient undergoes further work-up in the hospital. [MT]  1203 No significant signs of acute cholecystitis, the pericholecystic fluid could be reactive to the portal venous hypertension as noted on CT.  Negative sonographic Murphy sign.  At this time do not believe this is a surgical issue with the gallbladder, we will initiate heparin and admit her to the hospitalist. [MT]    Clinical Course User Index [MT] Destiny Hagin, Carola Rhine, MD                           Medical Decision Making Amount and/or Complexity of Data Reviewed Labs: ordered. Radiology: ordered. ECG/medicine tests: ordered.  Risk OTC drugs. Prescription drug management. Decision regarding hospitalization.   This patient presents to the ED with concern for epigastric discomfort, headache, nausea. This involves an extensive number of treatment options, and is a complaint that carries with it a high risk of complications and morbidity.  The differential diagnosis includes viral syndrome vs gastritis versus esophagitis versus  pancreatitis versus biliary disease versus other  External records from outside source obtained and reviewed including echocardiogram and stress test results and oncology office evaluation as noted above  I ordered and personally interpreted labs.  The pertinent results include:  troponins elevated; LFT/lipase wnl; wbc wnl  I ordered imaging studies including x-ray of the chest, CT chest/abd/pelvis, RUQ ultrasound I independently visualized and interpreted imaging which showed no emergent findings to explain symptoms; edematous GB without other acute signs of cholecystitis I agree with the radiologist interpretation  The patient was maintained on a cardiac monitor.  I personally viewed and interpreted the cardiac monitored which showed an underlying rhythm of: NSR  Per my interpretation the patient's ECG shows a normal sinus rhythm with some baseline wander in the inferior leads, but no persistent ST elevations, no reciprocal ST depressions to suggest acute ischemia  I ordered medication including zofran, pepcid, gi cocktail for suspected gastritis/esophagitis I have reviewed the patients home medicines and have made adjustments as needed  Test Considered: Lower suspicion for acute PE.  She is on her baseline 2L [place] O2 requirement, no tachycardia, breathing comfortably in Ed.  GI symptoms do not suggest acute PE  I requested consultation with the cardiology ,  and discussed lab and imaging findings as well as pertinent plan - they recommend: see ed course  After the interventions noted above, I reevaluated the patient and found that they have: improved   Dispostion:  After consideration of the diagnostic results and the patients response to treatment, I feel that the patent would benefit from medical admission.         Final Clinical Impression(s) / ED Diagnoses Final diagnoses:  None    Rx / DC Orders ED Discharge Orders     None         Jaycelynn Knickerbocker, Carola Rhine,  MD 06/09/22 [phone]

## 2022-06-10 ENCOUNTER — Inpatient Hospital Stay (HOSPITAL_COMMUNITY): Payer: Medicare Other

## 2022-06-10 DIAGNOSIS — I10 Essential (primary) hypertension: Secondary | ICD-10-CM | POA: Diagnosis not present

## 2022-06-10 DIAGNOSIS — I249 Acute ischemic heart disease, unspecified: Secondary | ICD-10-CM | POA: Diagnosis not present

## 2022-06-10 DIAGNOSIS — I25110 Atherosclerotic heart disease of native coronary artery with unstable angina pectoris: Secondary | ICD-10-CM

## 2022-06-10 DIAGNOSIS — R7989 Other specified abnormal findings of blood chemistry: Secondary | ICD-10-CM | POA: Diagnosis not present

## 2022-06-10 LAB — CBC
HCT: 37 % (ref 36.0–46.0)
Hemoglobin: 11.9 g/dL — ABNORMAL LOW (ref 12.0–15.0)
MCH: 29.5 pg (ref 26.0–34.0)
MCHC: 32.2 g/dL (ref 30.0–36.0)
MCV: 91.8 fL (ref 80.0–100.0)
Platelets: 223 10*3/uL (ref 150–400)
RBC: 4.03 MIL/uL (ref 3.87–5.11)
RDW: 18.3 % — ABNORMAL HIGH (ref 11.5–15.5)
WBC: 6 10*3/uL (ref 4.0–10.5)
nRBC: 0 % (ref 0.0–0.2)

## 2022-06-10 LAB — BASIC METABOLIC PANEL
Anion gap: 7 (ref 5–15)
BUN: 31 mg/dL — ABNORMAL HIGH (ref 8–23)
CO2: 29 mmol/L (ref 22–32)
Calcium: 9.4 mg/dL (ref 8.9–10.3)
Chloride: 101 mmol/L (ref 98–111)
Creatinine, Ser: 1.44 mg/dL — ABNORMAL HIGH (ref 0.44–1.00)
GFR, Estimated: 37 mL/min — ABNORMAL LOW (ref >60)
Glucose, Bld: 96 mg/dL (ref 70–99)
Potassium: 5 mmol/L (ref 3.5–5.1)
Sodium: 137 mmol/L (ref 135–145)

## 2022-06-10 LAB — LIPID PANEL
Cholesterol: 131 mg/dL (ref 0–200)
HDL: 52 mg/dL (ref >40)
LDL Cholesterol: 67 mg/dL (ref 0–99)
Total CHOL/HDL Ratio: 2.5 RATIO
Triglycerides: 62 mg/dL (ref <150)
VLDL: 12 mg/dL (ref 0–40)

## 2022-06-10 LAB — ECHOCARDIOGRAM COMPLETE
Area-P 1/2: 3.67 cm2
Calc EF: 78 %
Height: 63.5 in
S' Lateral: 1.9 cm
Single Plane A2C EF: 80 %
Single Plane A4C EF: 77.6 %
Weight: 2663.16 oz

## 2022-06-10 LAB — TROPONIN I (HIGH SENSITIVITY): Troponin I (High Sensitivity): 177 ng/L (ref <18)

## 2022-06-10 LAB — HEPARIN LEVEL (UNFRACTIONATED): Heparin Unfractionated: 0.55 IU/mL (ref 0.30–0.70)

## 2022-06-10 MED ORDER — SENNOSIDES-DOCUSATE SODIUM 8.6-50 MG PO TABS: 1.0000 | ORAL_TABLET | Freq: Every evening | ORAL | Status: DC | PRN

## 2022-06-10 MED ORDER — TRAZODONE HCL 50 MG PO TABS: 50.0000 mg | ORAL_TABLET | Freq: Every evening | ORAL | Status: DC | PRN

## 2022-06-10 MED ORDER — SODIUM CHLORIDE 0.9 % IV SOLN: INTRAVENOUS | Status: AC

## 2022-06-10 MED ORDER — IPRATROPIUM-ALBUTEROL 0.5-2.5 (3) MG/3ML IN SOLN
3.0000 mL | RESPIRATORY_TRACT | Status: DC | PRN
  Administered 2022-06-10: 3 mL via RESPIRATORY_TRACT
  Filled 2022-06-10: qty 3

## 2022-06-10 MED ORDER — METOPROLOL TARTRATE 5 MG/5ML IV SOLN: 5.0000 mg | INTRAVENOUS | Status: DC | PRN

## 2022-06-10 MED ORDER — HYDRALAZINE HCL 20 MG/ML IJ SOLN: 10.0000 mg | INTRAMUSCULAR | Status: DC | PRN

## 2022-06-10 MED ORDER — GUAIFENESIN 100 MG/5ML PO LIQD: 5.0000 mL | ORAL | Status: DC | PRN

## 2022-06-10 MED ORDER — POLYETHYLENE GLYCOL 3350 17 G PO PACK
17.0000 g | PACK | Freq: Every day | ORAL | Status: DC | PRN
  Administered 2022-06-10: 17 g via ORAL
  Filled 2022-06-10: qty 1

## 2022-06-10 MED ORDER — PHENOL 1.4 % MT LIQD
1.0000 | OROMUCOSAL | Status: DC | PRN
  Administered 2022-06-10: 1 via OROMUCOSAL
  Filled 2022-06-10: qty 177

## 2022-06-10 MED ORDER — OXYCODONE HCL 5 MG PO TABS: 5.0000 mg | ORAL_TABLET | ORAL | Status: DC | PRN

## 2022-06-10 MED ORDER — CLOPIDOGREL BISULFATE 75 MG PO TABS
75.0000 mg | ORAL_TABLET | Freq: Every day | ORAL | Status: DC
  Administered 2022-06-10 – 2022-06-11 (×2): 75 mg via ORAL
  Filled 2022-06-10 (×2): qty 1

## 2022-06-10 MED ORDER — BISACODYL 10 MG RE SUPP: 10.0000 mg | Freq: Once | RECTAL | Status: DC

## 2022-06-10 NOTE — Progress Notes (Signed)
ANTICOAGULATION CONSULT NOTE - follow up  Pharmacy Consult for heparin Indication: chest pain/ACS  Allergies  Allergen Reactions   Bee Venom Anaphylaxis, Shortness Of Breath, Swelling and Other (See Comments)    Swelling at site (reaction to bees and wasps)   Amlodipine Swelling and Other (See Comments)    Swelling of the ankles and hands    Levofloxacin Other (See Comments)    Joint pain    Alendronate Other (See Comments)    Joint pains / hypercalcemia    Hctz [Hydrochlorothiazide] Palpitations and Other (See Comments)    Sweating     Patient Measurements: Height: 5' 3.5" (161.3 cm) Weight: 75.5 kg (166 lb 7.2 oz) IBW/kg (Calculated) : 53.55TBW 81.6 Ht 63.5 inches Heparin Dosing Weight: 64.8 kg  Vital Signs: Temp: 98.1 F (36.7 C) (07/29 2300) Temp Source: Oral (07/29 2300) BP: 108/66 (07/30 0245) Pulse Rate: 62 (07/30 0300)  Labs: Recent Labs    06/09/22 0742 06/09/22 0744 06/09/22 0915 06/09/22 1625 06/09/22 2054 06/09/22 2154 06/10/22 0243  HGB 13.2  --   --   --   --   --  11.9*  HCT 41.0  --   --   --   --   --  37.0  PLT 276  --   --   --   --   --  223  HEPARINUNFRC  --   --   --   --  0.30  --  0.55  CREATININE  --  1.45*  --   --   --   --  1.44*  TROPONINIHS 618*  --  694* 439*  --  336*  --      Estimated Creatinine Clearance: 31.7 mL/min (A) (by C-G formula based on SCr of 1.44 mg/dL (H)).   Medical History: Past Medical History:  Diagnosis Date   Anemia    was while doing chemo   Anxiety    Asthma    Chemotherapy-induced neuropathy ([place]) 11/01/2015   Claustrophobia    COPD (chronic obstructive pulmonary disease) ([place])    Depression    Encounter for antineoplastic chemotherapy 02/09/2016   Glaucoma    Headache    prior to menopause   Heart murmur    History of echocardiogram    Echo 2/17: EF 41-66%, grade 1 diastolic dysfunction, mild MR, trivial pericardial effusion   History of nuclear stress test    Myoview 2/17: no ischemia or  scar, EF 79%; low risk   History of radiation therapy 10/30/16-11/06/16   Hyperlipidemia    Hypertension    Insomnia 05/16/2016   Non-small cell carcinoma of right lung, stage 2 ([place]) 10/03/2015   Radiation 02/29/16-04/10/16   50.4 Gy to right central chest   Renal cell carcinoma ([place])    L nephrectomy  in 2012    Assessment: 81 YO F with chest pain & emesis. Pharmacy consulted to dose heparin for ACS/STEMI. No anticoagulants PTA. CBC WNL, SCr 1.45 Troponin 618, 694  06/10/2022 Confirmatory HL 0.55 on 950 units/hr CBC WNL No bleeding or interruptions noted   Goal of Therapy:  Heparin level 0.3-0.7 units/ml Monitor platelets by anticoagulation protocol: Yes   Plan:  continue Heparin drip at 950 units/hr Daily CBC & heparin level while on heparin  Dolly Rias RPh 06/10/2022, 4:17 AM

## 2022-06-10 NOTE — Progress Notes (Signed)
  Echocardiogram 2D Echocardiogram has been performed.  Tammy Ball 06/10/2022, 8:43 AM

## 2022-06-10 NOTE — Progress Notes (Signed)
PROGRESS NOTE    Tammy Ball  DJT:[id number] DOB: 11-28-1940 DOA: 06/09/2022 PCP: Horald Pollen, MD   Brief Narrative:   81 y.o. female with medical history significant of non-small cell right lung carcinoma, renal cell carcinoma with left nephrectomy, chemo-induced anemia, anxiety, depression, claustrophobia, asthma/COPD, glaucoma, headaches, grade 1 diastolic dysfunction, hyperlipidemia, hypertension, insomnia who presented to the emergency department due to substernal/epigastric pain, nausea and vomiting.  Patient was found to have elevated troponin, EKG was unremarkable.  CTA chest negative for PE but showed evidence of coronary artery disease, PAH, emphysema.  Right upper quadrant ultrasound was unremarkable.   Assessment & Plan:  Principal Problem:   ACS (acute coronary syndrome) (HCC) Active Problems:   Hypercalcemia   Essential hypertension   COPD GOLD B-C   Spondylolisthesis of lumbar region   Hyperlipidemia   Atherosclerosis of aorta (HCC)   Hyponatremia   Epigastric pain   NSTEMI -Received aspirin.  Continue this.  IV heparin drip.  Trops peaked 694. Seen by cardiology team.  Echocardiogram ordered.  LDL 67.  CTA chest negative for PE but shows emphysema, PAH, thoracic compression fractures. - Tentative plans for [facility] per cardiology     Epigastric pain Right upper quadrant ultrasound negative.  Pain control, PPI.  LFTs and lipase normal  AKI - Baseline creatinine 0.9.  Creatinine today 1.4.  Hold losartan, gentle hydration     Hyponatremia Resolved     Hypercalcemia Likely from dehydration.  Resolved     Essential hypertension On Coreg 25 mg twice daily, losartan on hold due to AKI IV as needed Lopressor and hydralazine  History of non-small cell lung cancer - In remission     COPD GOLD B-C Bronchodilators, I-S/flutter valve     Spondylolisthesis of lumbar region Pain control and bowel regimen     Hyperlipidemia Statin     Atherosclerosis of  aorta (HCC) Aspirin and statin      DVT prophylaxis: Heparin drip Code Status: Full Code Family Communication:    Status is: Inpatient Remains inpatient appropriate because: Plans for cardiac eval. Likely will need LHC but will defer to cardiology team      Subjective: Chest pain free this morning, no new complaints.     Examination:  General exam: Appears calm and comfortable  Respiratory system: Clear to auscultation. Respiratory effort normal. Cardiovascular system: S1 & S2 heard, RRR. No JVD, murmurs, rubs, gallops or clicks. No pedal edema. Gastrointestinal system: Abdomen is nondistended, soft and nontender. No organomegaly or masses felt. Normal bowel sounds heard. Central nervous system: Alert and oriented. No focal neurological deficits. Extremities: Symmetric 5 x 5 power. Skin: No rashes, lesions or ulcers Psychiatry: Judgement and insight appear normal. Mood & affect appropriate.     Objective: Vitals:   06/10/22 0343 06/10/22 0400 06/10/22 0500 06/10/22 0600  BP:      Pulse:  62 63 66  Resp:  17 16 (!) 25  Temp:      TempSrc:      SpO2: 99% 97% 97% 97%  Weight:      Height:        Intake/Output Summary (Last 24 hours) at 06/10/2022 0717 Last data filed at 06/09/2022 2207 Gross per 24 hour  Intake 545.14 ml  Output 600 ml  Net -54.86 ml   Filed Weights   06/09/22 1500  Weight: 75.5 kg     Data Reviewed:   CBC: Recent Labs  Lab 06/09/22 0742 06/10/22 0243  WBC 7.2 6.0  HGB 13.2 11.9*  HCT 41.0 37.0  MCV 91.7 91.8  PLT 276 539   Basic Metabolic Panel: Recent Labs  Lab 06/09/22 0744 06/10/22 0243  NA 131* 137  K 4.9 5.0  CL 97* 101  CO2 23 29  GLUCOSE 124* 96  BUN 30* 31*  CREATININE 1.45* 1.44*  CALCIUM 10.4* 9.4   GFR: Estimated Creatinine Clearance: 31.7 mL/min (A) (by C-G formula based on SCr of 1.44 mg/dL (H)). Liver Function Tests: Recent Labs  Lab 06/09/22 0744  AST 25  ALT 21  ALKPHOS 60  BILITOT 0.7  PROT  6.8  ALBUMIN 3.8   Recent Labs  Lab 06/09/22 0744  LIPASE 30   No results for input(s): "AMMONIA" in the last 168 hours. Coagulation Profile: No results for input(s): "INR", "PROTIME" in the last 168 hours. Cardiac Enzymes: No results for input(s): "CKTOTAL", "CKMB", "CKMBINDEX", "TROPONINI" in the last 168 hours. BNP (last 3 results) No results for input(s): "PROBNP" in the last 8760 hours. HbA1C: No results for input(s): "HGBA1C" in the last 72 hours. CBG: No results for input(s): "GLUCAP" in the last 168 hours. Lipid Profile: Recent Labs    06/10/22 0243  CHOL 131  HDL 52  LDLCALC 67  TRIG 62  CHOLHDL 2.5   Thyroid Function Tests: No results for input(s): "TSH", "T4TOTAL", "FREET4", "T3FREE", "THYROIDAB" in the last 72 hours. Anemia Panel: No results for input(s): "VITAMINB12", "FOLATE", "FERRITIN", "TIBC", "IRON", "RETICCTPCT" in the last 72 hours. Sepsis Labs: No results for input(s): "PROCALCITON", "LATICACIDVEN" in the last 168 hours.  Recent Results (from the past 240 hour(s))  SARS Coronavirus 2 by RT PCR (hospital order, performed in [facility] hospital lab) *cepheid single result test* Anterior Nasal Swab     Status: None   Collection Time: 06/09/22  8:14 AM   Specimen: Anterior Nasal Swab  Result Value Ref Range Status   SARS Coronavirus 2 by RT PCR NEGATIVE NEGATIVE Final    Comment: (NOTE) SARS-CoV-2 target nucleic acids are NOT DETECTED.  The SARS-CoV-2 RNA is generally detectable in upper and lower respiratory specimens during the acute phase of infection. The lowest concentration of SARS-CoV-2 viral copies this assay can detect is 250 copies / mL. A negative result does not preclude SARS-CoV-2 infection and should not be used as the sole basis for treatment or other patient management decisions.  A negative result may occur with improper specimen collection / handling, submission of specimen other than nasopharyngeal swab, presence of viral  mutation(s) within the areas targeted by this assay, and inadequate number of viral copies (<250 copies / mL). A negative result must be combined with clinical observations, patient history, and epidemiological information.  Fact Sheet for Patients:   https://www.patel.info/  Fact Sheet for Healthcare Providers: https://hall.com/  This test is not yet approved or  cleared by the [place] FDA and has been authorized for detection and/or diagnosis of SARS-CoV-2 by FDA under an Emergency Use Authorization (EUA).  This EUA will remain in effect (meaning this test can be used) for the duration of the COVID-19 declaration under Section 564(b)(1) of the Act, 21 U.S.C. section 360bbb-3(b)(1), unless the authorization is terminated or revoked sooner.  Performed at [facility], [place] [address], [place], [place] [address]          Radiology Studies: US Abdomen Limited RUQ (LIVER/GB)  Result Date: 06/09/2022 CLINICAL DATA:  81 year old female with acute abdominal pain. EXAM: ULTRASOUND ABDOMEN LIMITED RIGHT UPPER QUADRANT COMPARISON:  06/09/2022 CT  FINDINGS: Gallbladder: There is no evidence of cholelithiasis, gallbladder wall thickening or sonographic Murphy sign. A small amount of pericholecystic fluid is identified. Common bile duct: Diameter: 6 mm. There is no evidence of intrahepatic or extrahepatic biliary dilatation. Liver: No focal lesion identified. Within normal limits in parenchymal echogenicity. Portal vein is patent on color Doppler imaging with normal direction of blood flow towards the liver. Other: None. IMPRESSION: 1. Small amount of nonspecific pericholecystic fluid. No evidence of cholelithiasis or other signs of acute cholecystitis. 2. No hepatic abnormalities identified.  No biliary dilatation. Electronically Signed   By: Margarette Canada M.D.   On: 06/09/2022 11:54   CT Angio Chest PE W and/or Wo  Contrast  Result Date: 06/09/2022 CLINICAL DATA:  Suspected pulmonary embolus. Epigastric pain. Nausea vomiting. Shortness of breath. EXAM: CT ANGIOGRAPHY CHEST WITH CONTRAST TECHNIQUE: Multidetector CT imaging of the chest was performed using the standard protocol during bolus administration of intravenous contrast. Multiplanar CT image reconstructions and MIPs were obtained to evaluate the vascular anatomy. RADIATION DOSE REDUCTION: This exam was performed according to the departmental dose-optimization program which includes automated exposure control, adjustment of the mA and/or kV according to patient size and/or use of iterative reconstruction technique. CONTRAST:  51mL OMNIPAQUE IOHEXOL 350 MG/ML SOLN COMPARISON:  CT scan of the chest December 08, 2021 FINDINGS: Cardiovascular: Atherosclerotic changes are identified in the nonaneurysmal thoracic aorta. Left coronary artery atherosclerotic change. The heart is stable with mild cardiomegaly. The main pulmonary artery measures up to 3.5 cm, stable. No pulmonary emboli. Mediastinum/Nodes: Tiny pleural effusions. No pericardial effusion. The chest wall is stable and unremarkable. The thyroid and esophagus are normal. No adenopathy. Lungs/Pleura: Right upper lobectomy. Remaining central airways are normal. No pneumothorax. Stable right perihilar radiation fibrosis. Stable emphysematous changes. Stable 2 mm subpleural nodule on the left on series 4, image 27. Radiation induced consolidation in the left base is stable. No acute infiltrate. No new nodule or mass. Upper Abdomen: No acute abnormality. Musculoskeletal: Stable compression fractures in the upper thoracic spine with vertebroplasties. No interval bony change. Review of the MIP images confirms the above findings. IMPRESSION: 1. No pulmonary emboli.  No acute abnormality. 2. Emphysema. 3. Atherosclerotic changes in the nonaneurysmal aorta. Coronary artery disease in the left coronary arteries. 4. The main  pulmonary artery measures 3.5 cm which is mildly enlarged raising the possibility of pulmonary arterial hypertension. 5. Right upper lobectomy with post radiation change on the right as above. 6. Consolidation in the left base is stable consistent with previously reported radiation induced consolidation. 7. Stable compression fractures and vertebroplasties in the upper thoracic spine. Aortic Atherosclerosis (ICD10-I70.0) and Emphysema (ICD10-J43.9). Electronically Signed   By: Dorise Bullion III M.D.   On: 06/09/2022 10:35   CT ABDOMEN PELVIS W CONTRAST  Result Date: 06/09/2022 CLINICAL DATA:  81 year old female with history of nausea, vomiting and lower abdominal pain since yesterday. EXAM: CT ABDOMEN AND PELVIS WITH CONTRAST TECHNIQUE: Multidetector CT imaging of the abdomen and pelvis was performed using the standard protocol following bolus administration of intravenous contrast. RADIATION DOSE REDUCTION: This exam was performed according to the departmental dose-optimization program which includes automated exposure control, adjustment of the mA and/or kV according to patient size and/or use of iterative reconstruction technique. CONTRAST:  32mL OMNIPAQUE IOHEXOL 350 MG/ML SOLN COMPARISON:  CT the abdomen and pelvis 11/05/2020. FINDINGS: Lower chest: Atherosclerotic calcifications in the descending thoracic aorta. Chronic nodular area of architectural distortion in the base of the left lower lobe, stable  compared to the prior examination, most compatible with an area of chronic post infectious or inflammatory scarring. Hepatobiliary: No suspicious cystic or solid hepatic lesions. No intra or extrahepatic biliary ductal dilatation. Mild periportal edema. No calcified gallstones are noted within the gallbladder. Gallbladder wall does appear edematous. No pericholecystic inflammatory changes. Pancreas: No pancreatic mass. No pancreatic ductal dilatation. No pancreatic or peripancreatic fluid collections or  inflammatory changes. Spleen: Unremarkable. Adrenals/Urinary Tract: Left kidney is not visualized, either congenitally or surgically absent. Right kidney and bilateral adrenal glands are normal in appearance. No hydroureteronephrosis. Urinary bladder is unremarkable in appearance. Stomach/Bowel: The appearance of the stomach is normal. There is no pathologic dilatation of small bowel or colon. Normal appendix. Vascular/Lymphatic: Extensive atherosclerosis in the abdominal aorta and pelvic vasculature, without evidence of aneurysm or dissection in the abdominal or pelvic vessels. No lymphadenopathy noted in the abdomen or pelvis. Reproductive: Uterus and ovaries are atrophic. Other: Small volume of ascites. No pneumoperitoneum. Musculoskeletal: There are no aggressive appearing lytic or blastic lesions noted in the visualized portions of the skeleton. IMPRESSION: 1. Periportal edema. Gallbladder wall is also edematous, without overt imaging findings to suggest an acute cholecystitis (and no definite calcified gallstones). Findings suggest intrinsic liver disease, and correlation with liver function tests is recommended. 2. Small volume of ascites. 3. Aortic atherosclerosis. 4. Additional incidental findings, as above. Electronically Signed   By: Vinnie Langton M.D.   On: 06/09/2022 10:30   DG Chest 2 View  Result Date: 06/09/2022 CLINICAL DATA:  Epigastric pain, chest pain. EXAM: CHEST - 2 VIEW COMPARISON:  November 05, 2020 and December 08, 2021 FINDINGS: EKG leads project over the chest. Cardiomediastinal contours are stable. Mild cardiac enlargement. No lobar consolidative process. Graded opacity at the LEFT and RIGHT lung base. Added density behind the LEFT heart border grossly similar to scout image from the January CT. No pneumothorax. Vertebroplasty changes in the upper thoracic spine. No gross acute process of skeletal structures to the extent evaluated. IMPRESSION: 1. Graded opacity at the LEFT and  RIGHT lung base more likely related overlapping soft tissues. Small effusions are possible. No sign of lobar consolidation. 2. Nodular density in the retrocardiac region in the LEFT lower chest likely related to post treatment changes documented on previous chest CT. Electronically Signed   By: Zetta Bills M.D.   On: 06/09/2022 08:12        Scheduled Meds:  aspirin EC  81 mg Oral Daily   atorvastatin  40 mg Oral Daily   bisacodyl  10 mg Rectal Once   carvedilol  25 mg Oral BID   Chlorhexidine Gluconate Cloth  6 each Topical Daily   fluticasone furoate-vilanterol  1 puff Inhalation Daily   And   umeclidinium bromide  1 puff Inhalation Daily   losartan  50 mg Oral BID   pantoprazole (PROTONIX) IV  40 mg Intravenous Daily   Continuous Infusions:  heparin 950 Units/hr (06/09/22 2207)     LOS: 1 day   Time spent= 35 mins    Derel Mcglasson Arsenio Loader, MD [facility]  If 7PM-7AM, please contact night-coverage  06/10/2022, 7:17 AM

## 2022-06-10 NOTE — Progress Notes (Addendum)
Progress Note  [name] Date of Encounter: 06/10/2022  [facility] Cardiologist: None ; Phineas Inches MD MS  Subjective   She feels well this AM. We discussed her findings and echo. She opts for medical management.  Mild AKI Troponin peaked , now down trending  694-> 439->336->177  Inpatient Medications    Scheduled Meds:  aspirin EC  81 mg Oral Daily   atorvastatin  40 mg Oral Daily   bisacodyl  10 mg Rectal Once   carvedilol  25 mg Oral BID   Chlorhexidine Gluconate Cloth  6 each Topical Daily   fluticasone furoate-vilanterol  1 puff Inhalation Daily   And   umeclidinium bromide  1 puff Inhalation Daily   pantoprazole (PROTONIX) IV  40 mg Intravenous Daily   Continuous Infusions:  sodium chloride 75 mL/hr at 06/10/22 0918   heparin 950 Units/hr (06/09/22 2207)   PRN Meds: acetaminophen **OR** acetaminophen, ALPRAZolam, alum & mag hydroxide-simeth **AND** lidocaine, guaiFENesin, hydrALAZINE, HYDROmorphone (DILAUDID) injection, ipratropium-albuterol, metoprolol tartrate, nitroGLYCERIN, ondansetron (ZOFRAN) IV, mouth rinse, oxyCODONE, phenol, polyethylene glycol, polyvinyl alcohol, senna-docusate, traMADol, traZODone   Vital Signs    Vitals:   06/10/22 0600 06/10/22 0700 06/10/22 0800 06/10/22 0900  BP:      Pulse: 66 64 66 87  Resp: (!) 25 15 14 19   Temp:   98.2 F (36.8 C)   TempSrc:   Oral   SpO2: 97% 98% 95% (!) 87%  Weight:      Height:        Intake/Output Summary (Last 24 hours) at 06/10/2022 1014 Last data filed at 06/10/2022 0900 Gross per 24 hour  Intake 905.14 ml  Output 600 ml  Net 305.14 ml      06/09/2022    3:00 PM 05/28/2022    3:26 PM 01/09/2022    9:39 AM  Last 3 Weights  Weight (lbs) 166 lb 7.2 oz 180 lb 182 lb 6.4 oz  Weight (kg) 75.5 kg 81.647 kg 82.736 kg      Telemetry    NSR - Personally Reviewed  ECG    NSR, borderline RVH features, anterior TWI, inferolateral TWI - Personally Reviewed  Physical Exam    Vitals:   06/10/22 1000 06/10/22 1015  BP:  137/66  Pulse: 69 69  Resp: (!) 29 14  Temp:    SpO2: 96% 97%    GEN: No acute distress.   Neck: No JVD Cardiac: RRR, no murmurs, rubs, or gallops.  Respiratory: Clear to auscultation bilaterally. GI: Soft, nontender, non-distended  MS: No edema; No deformity. Neuro:  Nonfocal  Psych: Normal affect   Labs    High Sensitivity Troponin:   Recent Labs  Lab 06/09/22 0742 06/09/22 0915 06/09/22 1625 06/09/22 2154 06/10/22 0739  TROPONINIHS 618* 694* 439* 336* 177*     Chemistry Recent Labs  Lab 06/09/22 0744 06/10/22 0243  NA 131* 137  K 4.9 5.0  CL 97* 101  CO2 23 29  GLUCOSE 124* 96  BUN 30* 31*  CREATININE 1.45* 1.44*  CALCIUM 10.4* 9.4  PROT 6.8  --   ALBUMIN 3.8  --   AST 25  --   ALT 21  --   ALKPHOS 60  --   BILITOT 0.7  --   GFRNONAA 37* 37*  ANIONGAP 11 7    Lipids  Recent Labs  Lab 06/10/22 0243  CHOL 131  TRIG 62  HDL 52  LDLCALC 67  CHOLHDL 2.5  Hematology Recent Labs  Lab 06/09/22 0742 06/10/22 0243  WBC 7.2 6.0  RBC 4.47 4.03  HGB 13.2 11.9*  HCT 41.0 37.0  MCV 91.7 91.8  MCH 29.5 29.5  MCHC 32.2 32.2  RDW 18.6* 18.3*  PLT 276 223   Thyroid No results for input(s): "TSH", "FREET4" in the last 168 hours.  BNPNo results for input(s): "BNP", "PROBNP" in the last 168 hours.  DDimer No results for input(s): "DDIMER" in the last 168 hours.   Radiology    US Abdomen Limited RUQ (LIVER/GB)  Result Date: 06/09/2022 CLINICAL DATA:  81 year old female with acute abdominal pain. EXAM: ULTRASOUND ABDOMEN LIMITED RIGHT UPPER QUADRANT COMPARISON:  06/09/2022 CT FINDINGS: Gallbladder: There is no evidence of cholelithiasis, gallbladder wall thickening or sonographic Murphy sign. A small amount of pericholecystic fluid is identified. Common bile duct: Diameter: 6 mm. There is no evidence of intrahepatic or extrahepatic biliary dilatation. Liver: No focal lesion identified. Within normal  limits in parenchymal echogenicity. Portal vein is patent on color Doppler imaging with normal direction of blood flow towards the liver. Other: None. IMPRESSION: 1. Small amount of nonspecific pericholecystic fluid. No evidence of cholelithiasis or other signs of acute cholecystitis. 2. No hepatic abnormalities identified.  No biliary dilatation. Electronically Signed   By: Margarette Canada M.D.   On: 06/09/2022 11:54   CT Angio Chest PE W and/or Wo Contrast  Result Date: 06/09/2022 CLINICAL DATA:  Suspected pulmonary embolus. Epigastric pain. Nausea vomiting. Shortness of breath. EXAM: CT ANGIOGRAPHY CHEST WITH CONTRAST TECHNIQUE: Multidetector CT imaging of the chest was performed using the standard protocol during bolus administration of intravenous contrast. Multiplanar CT image reconstructions and MIPs were obtained to evaluate the vascular anatomy. RADIATION DOSE REDUCTION: This exam was performed according to the departmental dose-optimization program which includes automated exposure control, adjustment of the mA and/or kV according to patient size and/or use of iterative reconstruction technique. CONTRAST:  35mL OMNIPAQUE IOHEXOL 350 MG/ML SOLN COMPARISON:  CT scan of the chest December 08, 2021 FINDINGS: Cardiovascular: Atherosclerotic changes are identified in the nonaneurysmal thoracic aorta. Left coronary artery atherosclerotic change. The heart is stable with mild cardiomegaly. The main pulmonary artery measures up to 3.5 cm, stable. No pulmonary emboli. Mediastinum/Nodes: Tiny pleural effusions. No pericardial effusion. The chest wall is stable and unremarkable. The thyroid and esophagus are normal. No adenopathy. Lungs/Pleura: Right upper lobectomy. Remaining central airways are normal. No pneumothorax. Stable right perihilar radiation fibrosis. Stable emphysematous changes. Stable 2 mm subpleural nodule on the left on series 4, image 27. Radiation induced consolidation in the left base is stable. No  acute infiltrate. No new nodule or mass. Upper Abdomen: No acute abnormality. Musculoskeletal: Stable compression fractures in the upper thoracic spine with vertebroplasties. No interval bony change. Review of the MIP images confirms the above findings. IMPRESSION: 1. No pulmonary emboli.  No acute abnormality. 2. Emphysema. 3. Atherosclerotic changes in the nonaneurysmal aorta. Coronary artery disease in the left coronary arteries. 4. The main pulmonary artery measures 3.5 cm which is mildly enlarged raising the possibility of pulmonary arterial hypertension. 5. Right upper lobectomy with post radiation change on the right as above. 6. Consolidation in the left base is stable consistent with previously reported radiation induced consolidation. 7. Stable compression fractures and vertebroplasties in the upper thoracic spine. Aortic Atherosclerosis (ICD10-I70.0) and Emphysema (ICD10-J43.9). Electronically Signed   By: Dorise Bullion III M.D.   On: 06/09/2022 10:35   CT ABDOMEN PELVIS W CONTRAST  Result Date: 06/09/2022 CLINICAL  DATA:  81 year old female with history of nausea, vomiting and lower abdominal pain since yesterday. EXAM: CT ABDOMEN AND PELVIS WITH CONTRAST TECHNIQUE: Multidetector CT imaging of the abdomen and pelvis was performed using the standard protocol following bolus administration of intravenous contrast. RADIATION DOSE REDUCTION: This exam was performed according to the departmental dose-optimization program which includes automated exposure control, adjustment of the mA and/or kV according to patient size and/or use of iterative reconstruction technique. CONTRAST:  25mL OMNIPAQUE IOHEXOL 350 MG/ML SOLN COMPARISON:  CT the abdomen and pelvis 11/05/2020. FINDINGS: Lower chest: Atherosclerotic calcifications in the descending thoracic aorta. Chronic nodular area of architectural distortion in the base of the left lower lobe, stable compared to the prior examination, most compatible with an  area of chronic post infectious or inflammatory scarring. Hepatobiliary: No suspicious cystic or solid hepatic lesions. No intra or extrahepatic biliary ductal dilatation. Mild periportal edema. No calcified gallstones are noted within the gallbladder. Gallbladder wall does appear edematous. No pericholecystic inflammatory changes. Pancreas: No pancreatic mass. No pancreatic ductal dilatation. No pancreatic or peripancreatic fluid collections or inflammatory changes. Spleen: Unremarkable. Adrenals/Urinary Tract: Left kidney is not visualized, either congenitally or surgically absent. Right kidney and bilateral adrenal glands are normal in appearance. No hydroureteronephrosis. Urinary bladder is unremarkable in appearance. Stomach/Bowel: The appearance of the stomach is normal. There is no pathologic dilatation of small bowel or colon. Normal appendix. Vascular/Lymphatic: Extensive atherosclerosis in the abdominal aorta and pelvic vasculature, without evidence of aneurysm or dissection in the abdominal or pelvic vessels. No lymphadenopathy noted in the abdomen or pelvis. Reproductive: Uterus and ovaries are atrophic. Other: Small volume of ascites. No pneumoperitoneum. Musculoskeletal: There are no aggressive appearing lytic or blastic lesions noted in the visualized portions of the skeleton. IMPRESSION: 1. Periportal edema. Gallbladder wall is also edematous, without overt imaging findings to suggest an acute cholecystitis (and no definite calcified gallstones). Findings suggest intrinsic liver disease, and correlation with liver function tests is recommended. 2. Small volume of ascites. 3. Aortic atherosclerosis. 4. Additional incidental findings, as above. Electronically Signed   By: Vinnie Langton M.D.   On: 06/09/2022 10:30   DG Chest 2 View  Result Date: 06/09/2022 CLINICAL DATA:  Epigastric pain, chest pain. EXAM: CHEST - 2 VIEW COMPARISON:  November 05, 2020 and December 08, 2021 FINDINGS: EKG leads  project over the chest. Cardiomediastinal contours are stable. Mild cardiac enlargement. No lobar consolidative process. Graded opacity at the LEFT and RIGHT lung base. Added density behind the LEFT heart border grossly similar to scout image from the January CT. No pneumothorax. Vertebroplasty changes in the upper thoracic spine. No gross acute process of skeletal structures to the extent evaluated. IMPRESSION: 1. Graded opacity at the LEFT and RIGHT lung base more likely related overlapping soft tissues. Small effusions are possible. No sign of lobar consolidation. 2. Nodular density in the retrocardiac region in the LEFT lower chest likely related to post treatment changes documented on previous chest CT. Electronically Signed   By: Zetta Bills M.D.   On: 06/09/2022 08:12    Cardiac Studies   TTE 2017 Study Conclusions   - Left ventricle: The cavity size was normal. Wall thickness was    normal. Systolic function was normal. The estimated ejection    fraction was in the range of 60% to 65%. Doppler parameters are    consistent with abnormal left ventricular relaxation (grade 1    diastolic dysfunction).  - Mitral valve: There was mild regurgitation.  - Pericardium,  extracardiac: A trivial pericardial effusion was    identified.    Myoview SPECT The left ventricular ejection fraction is hyperdynamic (>65%). Nuclear stress EF: 79%. There was no ST segment deviation noted during stress. The study is normal.   Normal stress nuclear study with no ischemia or infarction; EF 79 with normal wall motion  Patient Profile     Tammy Ball is a 81 y.o. female with a hx of Stage IIIA NSCLC, SCC RUL, COPD on home O2  who is being seen 06/09/2022 for  NSTEMI at the request of Dr. Olevia Bowens.  Assessment & Plan    NSTEMI: we discussed at length that she has signs of a mild NSTEMI. Nausea/abd pain may have been anginal equivalent. CT PE showed LAD dx and EKG shows anterior deep TWI and inferiorlateral  TWI. Likely LAD infarct. She feels well. She had no significant EKG changes. Troponins have down trended. We discussed option of medical management vs. LHC. Her daughter had a cardiac arrest during a cath in the past; she is hesitant to take this risk if not absolutely necessary. This is very reasonable. Otherwise echo shows normal LV function. She is euvolemic. Will plan to medically manage and see her in follow-up. - echo shows normal LV function, no WMA - continue heparin gtt for 48 hrs - continue aspirin 81 mg daily - started plavix 75 mg daily for 6 months to 1 year - continue atorvastatin 40 mg daily ; prior LDL at goal - continue coreg 25 mg BID ( increased from home dose)  2. AKI- likely prerenal. Encourage hydration  3. Non small cell lung CA: She is not on an immune checkpoint inhibitor. No recurrence of her malignancy; in remission  4. HTN: continue home losartan. Increased coreg 25 mg BID per above  I'll arrange follow up in clinic with myself. Otherwise, once she has completed 48 hours of heparin she is stable from a cardiac standpoint for discharge.  For questions or updates, please contact [place] Please consult [email or web address] for contact info under        Signed, Janina Mayo, MD  06/10/2022, 10:14 AM

## 2022-06-11 DIAGNOSIS — I249 Acute ischemic heart disease, unspecified: Secondary | ICD-10-CM | POA: Diagnosis not present

## 2022-06-11 LAB — CBC
HCT: 37.1 % (ref 36.0–46.0)
Hemoglobin: 11.5 g/dL — ABNORMAL LOW (ref 12.0–15.0)
MCH: 29.3 pg (ref 26.0–34.0)
MCHC: 31 g/dL (ref 30.0–36.0)
MCV: 94.4 fL (ref 80.0–100.0)
Platelets: 231 10*3/uL (ref 150–400)
RBC: 3.93 MIL/uL (ref 3.87–5.11)
RDW: 18.3 % — ABNORMAL HIGH (ref 11.5–15.5)
WBC: 5.4 10*3/uL (ref 4.0–10.5)
nRBC: 0 % (ref 0.0–0.2)

## 2022-06-11 LAB — BASIC METABOLIC PANEL
Anion gap: 5 (ref 5–15)
BUN: 24 mg/dL — ABNORMAL HIGH (ref 8–23)
CO2: 29 mmol/L (ref 22–32)
Calcium: 8.7 mg/dL — ABNORMAL LOW (ref 8.9–10.3)
Chloride: 101 mmol/L (ref 98–111)
Creatinine, Ser: 1 mg/dL (ref 0.44–1.00)
GFR, Estimated: 58 mL/min — ABNORMAL LOW (ref >60)
Glucose, Bld: 97 mg/dL (ref 70–99)
Potassium: 5 mmol/L (ref 3.5–5.1)
Sodium: 135 mmol/L (ref 135–145)

## 2022-06-11 LAB — MAGNESIUM: Magnesium: 2 mg/dL (ref 1.7–2.4)

## 2022-06-11 LAB — HEPARIN LEVEL (UNFRACTIONATED): Heparin Unfractionated: 0.49 IU/mL (ref 0.30–0.70)

## 2022-06-11 LAB — LIPOPROTEIN A (LPA): Lipoprotein (a): 167.8 nmol/L — ABNORMAL HIGH (ref <75.0)

## 2022-06-11 MED ORDER — CARVEDILOL 25 MG PO TABS: 25.0000 mg | ORAL_TABLET | Freq: Two times a day (BID) | ORAL | 0 refills | Status: DC

## 2022-06-11 MED ORDER — SALINE SPRAY 0.65 % NA SOLN
1.0000 | NASAL | Status: DC | PRN
  Administered 2022-06-11: 1 via NASAL
  Filled 2022-06-11: qty 44

## 2022-06-11 MED ORDER — CLOPIDOGREL BISULFATE 75 MG PO TABS: 75.0000 mg | ORAL_TABLET | Freq: Every day | ORAL | 0 refills | Status: DC

## 2022-06-11 NOTE — Discharge Summary (Signed)
Physician Discharge Summary  JAZZLYN HUIZENGA BJS:[id number] DOB: 07/09/1941 DOA: 06/09/2022  PCP: Horald Pollen, MD  Admit date: 06/09/2022 Discharge date: 06/11/2022  Admitted From: Home Disposition:  Home  Recommendations for Outpatient Follow-up:  Follow up with PCP in 1-2 weeks Please obtain BMP/CBC in one week your next doctors visit.  Cont ASA, Plavix daily added Coreg increased 25mg  BID. Cont Losartan  Cont Lipitor  Follow up outpatient with Cardiology.    Discharge Condition: Stable CODE STATUS: Full  Diet recommendation: Cardiac Diet.   Brief/Interim Summary:   81 y.o. female with medical history significant of non-small cell right lung carcinoma, renal cell carcinoma with left nephrectomy, chemo-induced anemia, anxiety, depression, claustrophobia, asthma/COPD, glaucoma, headaches, grade 1 diastolic dysfunction, hyperlipidemia, hypertension, insomnia who presented to the emergency department due to substernal/epigastric pain, nausea and vomiting.  Patient was found to have elevated troponin, EKG was unremarkable.  CTA chest negative for PE but showed evidence of coronary artery disease, PAH, emphysema.  Right upper quadrant ultrasound was unremarkable. Patient deferred LHC for now, she received 48hrs of Heparin and stable for dc with meds as mentioned above cleared by cardiology.  Stable for dc.     Assessment & Plan:  Principal Problem:   ACS (acute coronary syndrome) (HCC) Active Problems:   Hypercalcemia   Essential hypertension   COPD GOLD B-C   Spondylolisthesis of lumbar region   Hyperlipidemia   Atherosclerosis of aorta (HCC)   Hyponatremia   Epigastric pain   NSTEMI; resolved.  -Received aspirin.  Continue this.  IV heparin drip.  Trops peaked 694. Seen by cardiology team.  Echocardiogram  stable. LDL 67. Patient and cardiology discussed conservative management. Cont ASA, Losartan. Plavix added, Coreg increased. Completed 48hrs of heparin drip.       Epigastric pain; resolved.  Right upper quadrant ultrasound negative.    AKI; resolved.  - Baseline creatinine 0.9.  Cr peaked at 1.4     Hyponatremia Resolved     Hypercalcemia Likely from dehydration.  Resolved     Essential hypertension On Coreg 25 mg twice daily, cont losartan.  IV as needed Lopressor and hydralazine   History of non-small cell lung cancer - In remission     COPD GOLD B-C Bronchodilators, I-S/flutter valve     Spondylolisthesis of lumbar region Pain control and bowel regimen     Hyperlipidemia Statin     Atherosclerosis of aorta (HCC) Aspirin and statin        Assessment and Plan: No notes have been filed under this hospital service. Service: Hospitalist      Body mass index is 29.02 kg/m.       Discharge Diagnoses:  Principal Problem:   ACS (acute coronary syndrome) ([place]) Active Problems:   Hypercalcemia   Essential hypertension   COPD GOLD B-C   Spondylolisthesis of lumbar region   Hyperlipidemia   Atherosclerosis of aorta (HCC)   Hyponatremia   Epigastric pain      Consultations: Cardiology  Subjective: Pain free, wants to go home no complaints.  All questions answered.   Discharge Exam: Vitals:   06/11/22 0916 06/11/22 1307  BP:  (!) 111/57  Pulse:  63  Resp:  18  Temp:  98.4 F (36.9 C)  SpO2: 92% 94%   Vitals:   06/10/22 2112 06/11/22 0504 06/11/22 0916 06/11/22 1307  BP: 115/73 (!) 100/57  (!) 111/57  Pulse: 68 (!) 57  63  Resp: 16 20  18   Temp: 98.1 F (36.7  C) 98.3 F (36.8 C)  98.4 F (36.9 C)  TempSrc:    Oral  SpO2: 98% 97% 92% 94%  Weight:      Height:        General: Pt is alert, awake, not in acute distress Cardiovascular: RRR, S1/S2 +, no rubs, no gallops Respiratory: CTA bilaterally, no wheezing, no rhonchi Abdominal: Soft, NT, ND, bowel sounds + Extremities: no edema, no cyanosis  Discharge Instructions   Allergies as of 06/11/2022       Reactions   Bee Venom Anaphylaxis,  Shortness Of Breath, Swelling, Other (See Comments)   Swelling at site (reaction to bees and wasps)   Amlodipine Swelling, Other (See Comments)   Swelling of the ankles and hands    Levofloxacin Other (See Comments)   Joint pain    Alendronate Other (See Comments)   Joint pains / hypercalcemia    Hctz [hydrochlorothiazide] Palpitations, Other (See Comments)   Sweating         Medication List     TAKE these medications    acetaminophen 500 MG tablet Commonly known as: TYLENOL Take 500 mg by mouth every 6 (six) hours as needed for headache (pain). Reported on 05/24/2016   albuterol 108 (90 Base) MCG/ACT inhaler Commonly known as: Ventolin HFA Inhale 1-2 puffs into the lungs every 6 (six) hours as needed for wheezing or shortness of breath.   albuterol (2.5 MG/3ML) 0.083% nebulizer solution Commonly known as: PROVENTIL Take 3 mLs (2.5 mg total) by nebulization every 6 (six) hours as needed for wheezing or shortness of breath.   ARTIFICIAL TEARS OP Place 1 drop into both eyes daily as needed (dry eyes).   aspirin EC 81 MG tablet Take 81 mg by mouth every morning.   atorvastatin 40 MG tablet Commonly known as: LIPITOR TAKE 1 TABLET BY MOUTH EVERY DAY What changed: when to take this   carvedilol 25 MG tablet Commonly known as: COREG Take 1 tablet (25 mg total) by mouth 2 (two) times daily. What changed:  medication strength how much to take   clopidogrel 75 MG tablet Commonly known as: PLAVIX Take 1 tablet (75 mg total) by mouth daily. Start taking on: June 12, 2022   Fish Oil 1200 MG Caps Take 1,200 mg by mouth every morning.   losartan 100 MG tablet Commonly known as: COZAAR TAKE 1 TABLET BY MOUTH EVERY DAY What changed:  how much to take when to take this   Mucinex Maximum Strength 1200 MG Tb12 Generic drug: Guaifenesin Take 1,200 mg by mouth every morning.   OVER THE COUNTER MEDICATION Apply 1 Application topically See admin instructions. Original  vicks - apply topically to knees daily prn for pain   OXYGEN Inhale 2 L into the lungs as needed (to keep SATS at or above 90).   sodium chloride 0.65 % Soln nasal spray Commonly known as: OCEAN Place 1 spray into both nostrils daily as needed for congestion.   traMADol 50 MG tablet Commonly known as: ULTRAM Take 1 tablet (50 mg total) by mouth every 6 (six) hours as needed (BACK PAIN).   Trelegy Ellipta 100-62.5-25 MCG/ACT Aepb Generic drug: Fluticasone-Umeclidin-Vilant Inhale 1 puff into the lungs daily.   TUMS PO Take 1 tablet by mouth every evening.   vitamin C 1000 MG tablet Take 1,000 mg by mouth every morning.   VITAMIN D3 PO Take 1 tablet by mouth every morning.   Voltaren 1 % Gel Generic drug: diclofenac Sodium Apply 1 Application  topically at bedtime as needed (pain).   ZINC PO Take 1 tablet by mouth every morning.        Follow-up Information     Janina Mayo, MD Follow up.   Specialty: Cardiology Why: Hospital follow-up with Cardiology scheduled for 06/28/2022 at 11:00am. Please arrive 15 minutes early for check-in. If this date/time does not work for you, please call our office to reschedule. Contact information: [address] [place] [address] [phone]         Horald Pollen, MD Follow up in 1 week(s).   Specialty: Internal Medicine Contact information: [place] [address] [phone]                Allergies  Allergen Reactions   Bee Venom Anaphylaxis, Shortness Of Breath, Swelling and Other (See Comments)    Swelling at site (reaction to bees and wasps)   Amlodipine Swelling and Other (See Comments)    Swelling of the ankles and hands    Levofloxacin Other (See Comments)    Joint pain    Alendronate Other (See Comments)    Joint pains / hypercalcemia    Hctz [Hydrochlorothiazide] Palpitations and Other (See Comments)    Sweating     You were cared for by a hospitalist during  your hospital stay. If you have any questions about your discharge medications or the care you received while you were in the hospital after you are discharged, you can call the unit and asked to speak with the hospitalist on call if the hospitalist that took care of you is not available. Once you are discharged, your primary care physician will handle any further medical issues. Please note that no refills for any discharge medications will be authorized once you are discharged, as it is imperative that you return to your primary care physician (or establish a relationship with a primary care physician if you do not have one) for your aftercare needs so that they can reassess your need for medications and monitor your lab values.   Procedures/Studies: ECHOCARDIOGRAM COMPLETE  Result Date: 06/10/2022    ECHOCARDIOGRAM REPORT   Patient Name:   SORAH FALKENSTEIN Date of Exam: 06/10/2022 Medical Rec #:  [id number]      Height:       63.5 in Accession #:    [id number]     Weight:       166.4 lb Date of Birth:  [birth year]      BSA:          1.799 m Patient Age:    81 years       BP:           108/66 mmHg Patient Gender: F              HR:           66 bpm. Exam Location:  Inpatient Procedure: 3D Echo, 2D Echo, Cardiac Doppler and Color Doppler Indications:    I25.110 Atherosclerotic heart disease of native coronary artery                 with unstable angina pectoris  History:        Patient has prior history of Echocardiogram examinations, most                 recent 01/09/2016. CHF, Abnormal ECG, COPD,                 Signs/Symptoms:Shortness of Breath and  Dyspnea; Risk                 Factors:Hypertension and Dyslipidemia. Lung and renal cancer.  Sonographer:    Roseanna Rainbow RDCS Referring Phys: [id number] [place]  Sonographer Comments: Suboptimal apical window. IMPRESSIONS  1. Left ventricular ejection fraction, by estimation, is 60 to 65%. The left ventricle has normal function. The left ventricle has no  regional wall motion abnormalities. Left ventricular diastolic parameters are consistent with Grade I diastolic dysfunction (impaired relaxation).  2. Right ventricular systolic function is normal. The right ventricular size is normal. Tricuspid regurgitation signal is inadequate for assessing PA pressure.  3. A small pericardial effusion is present.  4. The mitral valve is grossly normal. No evidence of mitral valve regurgitation.  5. The aortic valve was not well visualized. Aortic valve regurgitation is not visualized.  6. There is Moderate (Grade III) plaque.  7. The inferior vena cava is dilated in size with >50% respiratory variability, suggesting right atrial pressure of 8 mmHg. Comparison(s): No prior Echocardiogram. Conclusion(s)/Recommendation(s): Normal biventricular function without evidence of hemodynamically significant valvular heart disease. FINDINGS  Left Ventricle: Left ventricular ejection fraction, by estimation, is 60 to 65%. The left ventricle has normal function. The left ventricle has no regional wall motion abnormalities. The left ventricular internal cavity size was normal in size. There is  borderline left ventricular hypertrophy. Left ventricular diastolic parameters are consistent with Grade I diastolic dysfunction (impaired relaxation). Right Ventricle: The right ventricular size is normal. No increase in right ventricular wall thickness. Right ventricular systolic function is normal. Tricuspid regurgitation signal is inadequate for assessing PA pressure. Left Atrium: Left atrial size was normal in size. Right Atrium: Right atrial size was normal in size. Pericardium: A small pericardial effusion is present. Mitral Valve: The mitral valve is grossly normal. No evidence of mitral valve regurgitation. Tricuspid Valve: Tricuspid valve regurgitation is not demonstrated. Aortic Valve: The aortic valve was not well visualized. Aortic valve regurgitation is not visualized. Pulmonic Valve:  Pulmonic valve regurgitation is not visualized. Aorta: The aortic root and ascending aorta are structurally normal, with no evidence of dilitation. There is moderate (Grade III) plaque. Venous: The inferior vena cava is dilated in size with greater than 50% respiratory variability, suggesting right atrial pressure of 8 mmHg. IAS/Shunts: No atrial level shunt detected by color flow Doppler.  LEFT VENTRICLE PLAX 2D LVIDd:         4.00 cm     Diastology LVIDs:         1.90 cm     LV e' medial:    5.40 cm/s LV PW:         1.00 cm     LV E/e' medial:  18.7 LV IVS:        1.10 cm     LV e' lateral:   6.15 cm/s LVOT diam:     2.10 cm     LV E/e' lateral: 16.4 LV SV:         99 LV SV Index:   55 LVOT Area:     3.46 cm                             3D Volume EF: LV Volumes (MOD)           3D EF:        72 % LV vol d, MOD A2C: 36.1 ml LV EDV:  62 ml LV vol d, MOD A4C: 38.4 ml LV ESV:       17 ml LV vol s, MOD A2C: 7.2 ml  LV SV:        44 ml LV vol s, MOD A4C: 8.6 ml LV SV MOD A2C:     28.9 ml LV SV MOD A4C:     38.4 ml LV SV MOD BP:      29.1 ml RIGHT VENTRICLE             IVC RV S prime:     12.10 cm/s  IVC diam: 2.60 cm TAPSE (M-mode): 1.8 cm LEFT ATRIUM             Index        RIGHT ATRIUM           Index LA diam:        2.10 cm 1.17 cm/m   RA Area:     15.40 cm LA Vol (A2C):   54.8 ml 30.46 ml/m  RA Volume:   33.10 ml  18.40 ml/m LA Vol (A4C):   21.0 ml 11.67 ml/m LA Biplane Vol: 34.4 ml 19.12 ml/m  AORTIC VALVE LVOT Vmax:   146.00 cm/s LVOT Vmean:  94.700 cm/s LVOT VTI:    0.285 m  AORTA Ao Root diam: 3.20 cm MITRAL VALVE MV Area (PHT): 3.67 cm     SHUNTS MV Decel Time: 207 msec     Systemic VTI:  0.29 m MV E velocity: 101.00 cm/s  Systemic Diam: 2.10 cm MV A velocity: 105.00 cm/s MV E/A ratio:  0.96 Mary Scientist, physiological signed by Phineas Inches Signature Date/Time: 06/10/2022/11:17:58 AM    Final    US Abdomen Limited RUQ (LIVER/GB)  Result Date: 06/09/2022 CLINICAL DATA:  81 year old female with  acute abdominal pain. EXAM: ULTRASOUND ABDOMEN LIMITED RIGHT UPPER QUADRANT COMPARISON:  06/09/2022 CT FINDINGS: Gallbladder: There is no evidence of cholelithiasis, gallbladder wall thickening or sonographic Murphy sign. A small amount of pericholecystic fluid is identified. Common bile duct: Diameter: 6 mm. There is no evidence of intrahepatic or extrahepatic biliary dilatation. Liver: No focal lesion identified. Within normal limits in parenchymal echogenicity. Portal vein is patent on color Doppler imaging with normal direction of blood flow towards the liver. Other: None. IMPRESSION: 1. Small amount of nonspecific pericholecystic fluid. No evidence of cholelithiasis or other signs of acute cholecystitis. 2. No hepatic abnormalities identified.  No biliary dilatation. Electronically Signed   By: Margarette Canada M.D.   On: 06/09/2022 11:54   CT Angio Chest PE W and/or Wo Contrast  Result Date: 06/09/2022 CLINICAL DATA:  Suspected pulmonary embolus. Epigastric pain. Nausea vomiting. Shortness of breath. EXAM: CT ANGIOGRAPHY CHEST WITH CONTRAST TECHNIQUE: Multidetector CT imaging of the chest was performed using the standard protocol during bolus administration of intravenous contrast. Multiplanar CT image reconstructions and MIPs were obtained to evaluate the vascular anatomy. RADIATION DOSE REDUCTION: This exam was performed according to the departmental dose-optimization program which includes automated exposure control, adjustment of the mA and/or kV according to patient size and/or use of iterative reconstruction technique. CONTRAST:  85mL OMNIPAQUE IOHEXOL 350 MG/ML SOLN COMPARISON:  CT scan of the chest December 08, 2021 FINDINGS: Cardiovascular: Atherosclerotic changes are identified in the nonaneurysmal thoracic aorta. Left coronary artery atherosclerotic change. The heart is stable with mild cardiomegaly. The main pulmonary artery measures up to 3.5 cm, stable. No pulmonary emboli. Mediastinum/Nodes: Tiny  pleural effusions. No pericardial effusion. The chest wall is stable  and unremarkable. The thyroid and esophagus are normal. No adenopathy. Lungs/Pleura: Right upper lobectomy. Remaining central airways are normal. No pneumothorax. Stable right perihilar radiation fibrosis. Stable emphysematous changes. Stable 2 mm subpleural nodule on the left on series 4, image 27. Radiation induced consolidation in the left base is stable. No acute infiltrate. No new nodule or mass. Upper Abdomen: No acute abnormality. Musculoskeletal: Stable compression fractures in the upper thoracic spine with vertebroplasties. No interval bony change. Review of the MIP images confirms the above findings. IMPRESSION: 1. No pulmonary emboli.  No acute abnormality. 2. Emphysema. 3. Atherosclerotic changes in the nonaneurysmal aorta. Coronary artery disease in the left coronary arteries. 4. The main pulmonary artery measures 3.5 cm which is mildly enlarged raising the possibility of pulmonary arterial hypertension. 5. Right upper lobectomy with post radiation change on the right as above. 6. Consolidation in the left base is stable consistent with previously reported radiation induced consolidation. 7. Stable compression fractures and vertebroplasties in the upper thoracic spine. Aortic Atherosclerosis (ICD10-I70.0) and Emphysema (ICD10-J43.9). Electronically Signed   By: Dorise Bullion III M.D.   On: 06/09/2022 10:35   CT ABDOMEN PELVIS W CONTRAST  Result Date: 06/09/2022 CLINICAL DATA:  81 year old female with history of nausea, vomiting and lower abdominal pain since yesterday. EXAM: CT ABDOMEN AND PELVIS WITH CONTRAST TECHNIQUE: Multidetector CT imaging of the abdomen and pelvis was performed using the standard protocol following bolus administration of intravenous contrast. RADIATION DOSE REDUCTION: This exam was performed according to the departmental dose-optimization program which includes automated exposure control, adjustment of  the mA and/or kV according to patient size and/or use of iterative reconstruction technique. CONTRAST:  72mL OMNIPAQUE IOHEXOL 350 MG/ML SOLN COMPARISON:  CT the abdomen and pelvis 11/05/2020. FINDINGS: Lower chest: Atherosclerotic calcifications in the descending thoracic aorta. Chronic nodular area of architectural distortion in the base of the left lower lobe, stable compared to the prior examination, most compatible with an area of chronic post infectious or inflammatory scarring. Hepatobiliary: No suspicious cystic or solid hepatic lesions. No intra or extrahepatic biliary ductal dilatation. Mild periportal edema. No calcified gallstones are noted within the gallbladder. Gallbladder wall does appear edematous. No pericholecystic inflammatory changes. Pancreas: No pancreatic mass. No pancreatic ductal dilatation. No pancreatic or peripancreatic fluid collections or inflammatory changes. Spleen: Unremarkable. Adrenals/Urinary Tract: Left kidney is not visualized, either congenitally or surgically absent. Right kidney and bilateral adrenal glands are normal in appearance. No hydroureteronephrosis. Urinary bladder is unremarkable in appearance. Stomach/Bowel: The appearance of the stomach is normal. There is no pathologic dilatation of small bowel or colon. Normal appendix. Vascular/Lymphatic: Extensive atherosclerosis in the abdominal aorta and pelvic vasculature, without evidence of aneurysm or dissection in the abdominal or pelvic vessels. No lymphadenopathy noted in the abdomen or pelvis. Reproductive: Uterus and ovaries are atrophic. Other: Small volume of ascites. No pneumoperitoneum. Musculoskeletal: There are no aggressive appearing lytic or blastic lesions noted in the visualized portions of the skeleton. IMPRESSION: 1. Periportal edema. Gallbladder wall is also edematous, without overt imaging findings to suggest an acute cholecystitis (and no definite calcified gallstones). Findings suggest intrinsic  liver disease, and correlation with liver function tests is recommended. 2. Small volume of ascites. 3. Aortic atherosclerosis. 4. Additional incidental findings, as above. Electronically Signed   By: Vinnie Langton M.D.   On: 06/09/2022 10:30   DG Chest 2 View  Result Date: 06/09/2022 CLINICAL DATA:  Epigastric pain, chest pain. EXAM: CHEST - 2 VIEW COMPARISON:  November 05, 2020 and December 08, 2021 FINDINGS: EKG leads project over the chest. Cardiomediastinal contours are stable. Mild cardiac enlargement. No lobar consolidative process. Graded opacity at the LEFT and RIGHT lung base. Added density behind the LEFT heart border grossly similar to scout image from the January CT. No pneumothorax. Vertebroplasty changes in the upper thoracic spine. No gross acute process of skeletal structures to the extent evaluated. IMPRESSION: 1. Graded opacity at the LEFT and RIGHT lung base more likely related overlapping soft tissues. Small effusions are possible. No sign of lobar consolidation. 2. Nodular density in the retrocardiac region in the LEFT lower chest likely related to post treatment changes documented on previous chest CT. Electronically Signed   By: Zetta Bills M.D.   On: 06/09/2022 08:12     The results of significant diagnostics from this hospitalization (including imaging, microbiology, ancillary and laboratory) are listed below for reference.     Microbiology: Recent Results (from the past 240 hour(s))  SARS Coronavirus 2 by RT PCR (hospital order, performed in [facility] hospital lab) *cepheid single result test* Anterior Nasal Swab     Status: None   Collection Time: 06/09/22  8:14 AM   Specimen: Anterior Nasal Swab  Result Value Ref Range Status   SARS Coronavirus 2 by RT PCR NEGATIVE NEGATIVE Final    Comment: (NOTE) SARS-CoV-2 target nucleic acids are NOT DETECTED.  The SARS-CoV-2 RNA is generally detectable in upper and lower respiratory specimens during the acute phase of  infection. The lowest concentration of SARS-CoV-2 viral copies this assay can detect is 250 copies / mL. A negative result does not preclude SARS-CoV-2 infection and should not be used as the sole basis for treatment or other patient management decisions.  A negative result may occur with improper specimen collection / handling, submission of specimen other than nasopharyngeal swab, presence of viral mutation(s) within the areas targeted by this assay, and inadequate number of viral copies (<250 copies / mL). A negative result must be combined with clinical observations, patient history, and epidemiological information.  Fact Sheet for Patients:   https://www.patel.info/  Fact Sheet for Healthcare Providers: https://hall.com/  This test is not yet approved or  cleared by the [place] FDA and has been authorized for detection and/or diagnosis of SARS-CoV-2 by FDA under an Emergency Use Authorization (EUA).  This EUA will remain in effect (meaning this test can be used) for the duration of the COVID-19 declaration under Section 564(b)(1) of the Act, 21 U.S.C. section 360bbb-3(b)(1), unless the authorization is terminated or revoked sooner.  Performed at [facility], [place] [address] [place], [place] [address]      Labs: BNP (last 3 results) No results for input(s): "BNP" in the last 8760 hours. Basic Metabolic Panel: Recent Labs  Lab 06/09/22 0744 06/10/22 0243 06/11/22 0430  NA 131* 137 135  K 4.9 5.0 5.0  CL 97* 101 101  CO2 23 29 29   GLUCOSE 124* 96 97  BUN 30* 31* 24*  CREATININE 1.45* 1.44* 1.00  CALCIUM 10.4* 9.4 8.7*  MG  --   --  2.0   Liver Function Tests: Recent Labs  Lab 06/09/22 0744  AST 25  ALT 21  ALKPHOS 60  BILITOT 0.7  PROT 6.8  ALBUMIN 3.8   Recent Labs  Lab 06/09/22 0744  LIPASE 30   No results for input(s): "AMMONIA" in the last 168 hours. CBC: Recent Labs  Lab  06/09/22 0742 06/10/22 0243 06/11/22 0430  WBC 7.2 6.0 5.4  HGB 13.2 11.9* 11.5*  HCT 41.0 37.0 37.1  MCV 91.7 91.8 94.4  PLT 276 223 231   Cardiac Enzymes: No results for input(s): "CKTOTAL", "CKMB", "CKMBINDEX", "TROPONINI" in the last 168 hours. BNP: Invalid input(s): "POCBNP" CBG: No results for input(s): "GLUCAP" in the last 168 hours. D-Dimer No results for input(s): "DDIMER" in the last 72 hours. Hgb A1c No results for input(s): "HGBA1C" in the last 72 hours. Lipid Profile Recent Labs    06/10/22 0243  CHOL 131  HDL 52  LDLCALC 67  TRIG 62  CHOLHDL 2.5   Thyroid function studies No results for input(s): "TSH", "T4TOTAL", "T3FREE", "THYROIDAB" in the last 72 hours.  Invalid input(s): "FREET3" Anemia work up No results for input(s): "VITAMINB12", "FOLATE", "FERRITIN", "TIBC", "IRON", "RETICCTPCT" in the last 72 hours. Urinalysis    Component Value Date/Time   COLORURINE STRAW (A) 11/11/2020 0826   APPEARANCEUR CLEAR 11/11/2020 0826   LABSPEC 1.011 11/11/2020 0826   LABSPEC 1.010 10/18/2015 0959   PHURINE 5.0 11/11/2020 0826   GLUCOSEU NEGATIVE 11/11/2020 0826   GLUCOSEU Negative 10/18/2015 0959   HGBUR NEGATIVE 11/11/2020 0826   BILIRUBINUR NEGATIVE 11/11/2020 0826   BILIRUBINUR negative 02/19/2018 0904   BILIRUBINUR Negative 10/18/2015 0959   KETONESUR NEGATIVE 11/11/2020 0826   PROTEINUR NEGATIVE 11/11/2020 0826   UROBILINOGEN 0.2 02/19/2018 0904   UROBILINOGEN 0.2 10/18/2015 0959   NITRITE NEGATIVE 11/11/2020 0826   LEUKOCYTESUR NEGATIVE 11/11/2020 0826   LEUKOCYTESUR Negative 10/18/2015 0959   Sepsis Labs Recent Labs  Lab 06/09/22 0742 06/10/22 0243 06/11/22 0430  WBC 7.2 6.0 5.4   Microbiology Recent Results (from the past 240 hour(s))  SARS Coronavirus 2 by RT PCR (hospital order, performed in [facility] hospital lab) *cepheid single result test* Anterior Nasal Swab     Status: None   Collection Time: 06/09/22  8:14 AM   Specimen:  Anterior Nasal Swab  Result Value Ref Range Status   SARS Coronavirus 2 by RT PCR NEGATIVE NEGATIVE Final    Comment: (NOTE) SARS-CoV-2 target nucleic acids are NOT DETECTED.  The SARS-CoV-2 RNA is generally detectable in upper and lower respiratory specimens during the acute phase of infection. The lowest concentration of SARS-CoV-2 viral copies this assay can detect is 250 copies / mL. A negative result does not preclude SARS-CoV-2 infection and should not be used as the sole basis for treatment or other patient management decisions.  A negative result may occur with improper specimen collection / handling, submission of specimen other than nasopharyngeal swab, presence of viral mutation(s) within the areas targeted by this assay, and inadequate number of viral copies (<250 copies / mL). A negative result must be combined with clinical observations, patient history, and epidemiological information.  Fact Sheet for Patients:   https://www.patel.info/  Fact Sheet for Healthcare Providers: https://hall.com/  This test is not yet approved or  cleared by the [place] FDA and has been authorized for detection and/or diagnosis of SARS-CoV-2 by FDA under an Emergency Use Authorization (EUA).  This EUA will remain in effect (meaning this test can be used) for the duration of the COVID-19 declaration under Section 564(b)(1) of the Act, 21 U.S.C. section 360bbb-3(b)(1), unless the authorization is terminated or revoked sooner.  Performed at [facility], [place] [address], [place],  [address]      Time coordinating discharge:  I have spent 35 minutes face to face with the patient and on the ward discussing the patients care, assessment, plan and disposition  with other care givers. >50% of the time was devoted counseling the patient about the risks and benefits of treatment/Discharge disposition and coordinating care.    SIGNED:   Damita Lack, MD  [facility] 06/11/2022, 1:52 PM   If 7PM-7AM, please contact night-coverage

## 2022-06-11 NOTE — Progress Notes (Signed)
ANTICOAGULATION CONSULT NOTE - Follow Up Consult  Pharmacy Consult for Heparin Indication: chest pain/ACS  Allergies  Allergen Reactions   Bee Venom Anaphylaxis, Shortness Of Breath, Swelling and Other (See Comments)    Swelling at site (reaction to bees and wasps)   Amlodipine Swelling and Other (See Comments)    Swelling of the ankles and hands    Levofloxacin Other (See Comments)    Joint pain    Alendronate Other (See Comments)    Joint pains / hypercalcemia    Hctz [Hydrochlorothiazide] Palpitations and Other (See Comments)    Sweating     Patient Measurements: Height: 5' 3.5" (161.3 cm) Weight: 75.5 kg (166 lb 7.2 oz) IBW/kg (Calculated) : 53.55 Heparin Dosing Weight: 69.5 kg  Vital Signs: Temp: 98.3 F (36.8 C) (07/31 0504) BP: 100/57 (07/31 0504) Pulse Rate: 57 (07/31 0504)  Labs: Recent Labs    06/09/22 0742 06/09/22 0744 06/09/22 0915 06/09/22 1625 06/09/22 2054 06/09/22 2154 06/10/22 0243 06/10/22 0739 06/11/22 0430  HGB 13.2  --   --   --   --   --  11.9*  --  11.5*  HCT 41.0  --   --   --   --   --  37.0  --  37.1  PLT 276  --   --   --   --   --  223  --  231  HEPARINUNFRC  --   --   --   --  0.30  --  0.55  --  0.49  CREATININE  --  1.45*  --   --   --   --  1.44*  --  1.00  TROPONINIHS 618*  --    < > 439*  --  336*  --  177*  --    < > = values in this interval not displayed.    Estimated Creatinine Clearance: 45.7 mL/min (by C-G formula based on SCr of 1 mg/dL).   Assessment: AC/Heme: UFH for ACS/STEMI. CT neg for PE - HL 0.49, Hgb 11.5 stable. Plts 231.  Goal of Therapy:  Heparin level 0.3-0.7 units/ml Monitor platelets by anticoagulation protocol: Yes   Plan:  Con't IV heparin at 950 units/hr until 1300 (48 hrs) Medical management. May resume losartan   Miku Udall S. Alford Highland, PharmD, BCPS Clinical Staff Pharmacist [email or web address] Alford Highland, [place] 06/11/2022,7:35 AM

## 2022-06-11 NOTE — Progress Notes (Signed)
Progress Note  [name] Date of Encounter: 06/11/2022  [facility] Cardiologist: None ; Phineas Inches MD MS  Subjective   She feels well this AM. EKG is stable. Troponin has down trended. She had no nausea. Notes stool is dark but not melena.  Mild AKI has improved Hgb 13->11 Troponin peaked , now down trending  694-> 439->336->177  Inpatient Medications    Scheduled Meds:  aspirin EC  81 mg Oral Daily   atorvastatin  40 mg Oral Daily   bisacodyl  10 mg Rectal Once   carvedilol  25 mg Oral BID   clopidogrel  75 mg Oral Daily   fluticasone furoate-vilanterol  1 puff Inhalation Daily   And   umeclidinium bromide  1 puff Inhalation Daily   pantoprazole (PROTONIX) IV  40 mg Intravenous Daily   Continuous Infusions:  heparin 950 Units/hr (06/10/22 1406)   PRN Meds: acetaminophen **OR** acetaminophen, ALPRAZolam, alum & mag hydroxide-simeth **AND** lidocaine, guaiFENesin, hydrALAZINE, HYDROmorphone (DILAUDID) injection, ipratropium-albuterol, metoprolol tartrate, nitroGLYCERIN, ondansetron (ZOFRAN) IV, mouth rinse, oxyCODONE, phenol, polyethylene glycol, polyvinyl alcohol, senna-docusate, sodium chloride, traMADol, traZODone   Vital Signs    Vitals:   06/10/22 1227 06/10/22 1456 06/10/22 2112 06/11/22 0504  BP:  129/77 115/73 (!) 100/57  Pulse:  65 68 (!) 57  Resp:  19 16 20   Temp:  98 F (36.7 C) 98.1 F (36.7 C) 98.3 F (36.8 C)  TempSrc:      SpO2: 97% 98% 98% 97%  Weight:      Height:        Intake/Output Summary (Last 24 hours) at 06/11/2022 0859 Last data filed at 06/11/2022 0354 Gross per 24 hour  Intake 1631.12 ml  Output --  Net 1631.12 ml      06/09/2022    3:00 PM 05/28/2022    3:26 PM 01/09/2022    9:39 AM  Last 3 Weights  Weight (lbs) 166 lb 7.2 oz 180 lb 182 lb 6.4 oz  Weight (kg) 75.5 kg 81.647 kg 82.736 kg      Telemetry    NSR - Personally Reviewed  ECG    NSR, right axis, anterior TWI, anterolateral TWI, inferior  TWI Personally Reviewed  Physical Exam   Vitals:   06/10/22 2112 06/11/22 0504  BP: 115/73 (!) 100/57  Pulse: 68 (!) 57  Resp: 16 20  Temp: 98.1 F (36.7 C) 98.3 F (36.8 C)  SpO2: 98% 97%    GEN: No acute distress.   Neck: No JVD Cardiac: RRR, no murmurs, rubs, or gallops.  Respiratory: Clear to auscultation bilaterally. GI: Soft, nontender, non-distended  MS: No edema; No deformity. Neuro:  Nonfocal  Psych: Normal affect   Labs    High Sensitivity Troponin:   Recent Labs  Lab 06/09/22 0742 06/09/22 0915 06/09/22 1625 06/09/22 2154 06/10/22 0739  TROPONINIHS 618* 694* 439* 336* 177*     Chemistry Recent Labs  Lab 06/09/22 0744 06/10/22 0243 06/11/22 0430  NA 131* 137 135  K 4.9 5.0 5.0  CL 97* 101 101  CO2 23 29 29   GLUCOSE 124* 96 97  BUN 30* 31* 24*  CREATININE 1.45* 1.44* 1.00  CALCIUM 10.4* 9.4 8.7*  MG  --   --  2.0  PROT 6.8  --   --   ALBUMIN 3.8  --   --   AST 25  --   --   ALT 21  --   --   ALKPHOS 60  --   --  BILITOT 0.7  --   --   GFRNONAA 37* 37* 58*  ANIONGAP 11 7 5     Lipids  Recent Labs  Lab 06/10/22 0243  CHOL 131  TRIG 62  HDL 52  LDLCALC 67  CHOLHDL 2.5    Hematology Recent Labs  Lab 06/09/22 0742 06/10/22 0243 06/11/22 0430  WBC 7.2 6.0 5.4  RBC 4.47 4.03 3.93  HGB 13.2 11.9* 11.5*  HCT 41.0 37.0 37.1  MCV 91.7 91.8 94.4  MCH 29.5 29.5 29.3  MCHC 32.2 32.2 31.0  RDW 18.6* 18.3* 18.3*  PLT 276 223 231   Thyroid No results for input(s): "TSH", "FREET4" in the last 168 hours.  BNPNo results for input(s): "BNP", "PROBNP" in the last 168 hours.  DDimer No results for input(s): "DDIMER" in the last 168 hours.   Radiology    ECHOCARDIOGRAM COMPLETE  Result Date: 06/10/2022    ECHOCARDIOGRAM REPORT   Patient Name:   Tammy Ball Date of Exam: 06/10/2022 Medical Rec #:  [id number]      Height:       63.5 in Accession #:    [id number]     Weight:       166.4 lb Date of Birth:  06/25/1941      BSA:          1.799  m Patient Age:    81 years       BP:           108/66 mmHg Patient Gender: F              HR:           66 bpm. Exam Location:  Inpatient Procedure: 3D Echo, 2D Echo, Cardiac Doppler and Color Doppler Indications:    I25.110 Atherosclerotic heart disease of native coronary artery                 with unstable angina pectoris  History:        Patient has prior history of Echocardiogram examinations, most                 recent 01/09/2016. CHF, Abnormal ECG, COPD,                 Signs/Symptoms:Shortness of Breath and Dyspnea; Risk                 Factors:Hypertension and Dyslipidemia. Lung and renal cancer.  Sonographer:    Roseanna Rainbow RDCS Referring Phys: [id number] [place]  Sonographer Comments: Suboptimal apical window. IMPRESSIONS  1. Left ventricular ejection fraction, by estimation, is 60 to 65%. The left ventricle has normal function. The left ventricle has no regional wall motion abnormalities. Left ventricular diastolic parameters are consistent with Grade I diastolic dysfunction (impaired relaxation).  2. Right ventricular systolic function is normal. The right ventricular size is normal. Tricuspid regurgitation signal is inadequate for assessing PA pressure.  3. A small pericardial effusion is present.  4. The mitral valve is grossly normal. No evidence of mitral valve regurgitation.  5. The aortic valve was not well visualized. Aortic valve regurgitation is not visualized.  6. There is Moderate (Grade III) plaque.  7. The inferior vena cava is dilated in size with >50% respiratory variability, suggesting right atrial pressure of 8 mmHg. Comparison(s): No prior Echocardiogram. Conclusion(s)/Recommendation(s): Normal biventricular function without evidence of hemodynamically significant valvular heart disease. FINDINGS  Left Ventricle: Left ventricular ejection fraction, by estimation, is 60 to 65%. The left ventricle  has normal function. The left ventricle has no regional wall motion abnormalities.  The left ventricular internal cavity size was normal in size. There is  borderline left ventricular hypertrophy. Left ventricular diastolic parameters are consistent with Grade I diastolic dysfunction (impaired relaxation). Right Ventricle: The right ventricular size is normal. No increase in right ventricular wall thickness. Right ventricular systolic function is normal. Tricuspid regurgitation signal is inadequate for assessing PA pressure. Left Atrium: Left atrial size was normal in size. Right Atrium: Right atrial size was normal in size. Pericardium: A small pericardial effusion is present. Mitral Valve: The mitral valve is grossly normal. No evidence of mitral valve regurgitation. Tricuspid Valve: Tricuspid valve regurgitation is not demonstrated. Aortic Valve: The aortic valve was not well visualized. Aortic valve regurgitation is not visualized. Pulmonic Valve: Pulmonic valve regurgitation is not visualized. Aorta: The aortic root and ascending aorta are structurally normal, with no evidence of dilitation. There is moderate (Grade III) plaque. Venous: The inferior vena cava is dilated in size with greater than 50% respiratory variability, suggesting right atrial pressure of 8 mmHg. IAS/Shunts: No atrial level shunt detected by color flow Doppler.  LEFT VENTRICLE PLAX 2D LVIDd:         4.00 cm     Diastology LVIDs:         1.90 cm     LV e' medial:    5.40 cm/s LV PW:         1.00 cm     LV E/e' medial:  18.7 LV IVS:        1.10 cm     LV e' lateral:   6.15 cm/s LVOT diam:     2.10 cm     LV E/e' lateral: 16.4 LV SV:         99 LV SV Index:   55 LVOT Area:     3.46 cm                             3D Volume EF: LV Volumes (MOD)           3D EF:        72 % LV vol d, MOD A2C: 36.1 ml LV EDV:       62 ml LV vol d, MOD A4C: 38.4 ml LV ESV:       17 ml LV vol s, MOD A2C: 7.2 ml  LV SV:        44 ml LV vol s, MOD A4C: 8.6 ml LV SV MOD A2C:     28.9 ml LV SV MOD A4C:     38.4 ml LV SV MOD BP:      29.1 ml RIGHT  VENTRICLE             IVC RV S prime:     12.10 cm/s  IVC diam: 2.60 cm TAPSE (M-mode): 1.8 cm LEFT ATRIUM             Index        RIGHT ATRIUM           Index LA diam:        2.10 cm 1.17 cm/m   RA Area:     15.40 cm LA Vol (A2C):   54.8 ml 30.46 ml/m  RA Volume:   33.10 ml  18.40 ml/m LA Vol (A4C):   21.0 ml 11.67 ml/m LA Biplane Vol: 34.4 ml 19.12 ml/m  AORTIC VALVE  LVOT Vmax:   146.00 cm/s LVOT Vmean:  94.700 cm/s LVOT VTI:    0.285 m  AORTA Ao Root diam: 3.20 cm MITRAL VALVE MV Area (PHT): 3.67 cm     SHUNTS MV Decel Time: 207 msec     Systemic VTI:  0.29 m MV E velocity: 101.00 cm/s  Systemic Diam: 2.10 cm MV A velocity: 105.00 cm/s MV E/A ratio:  0.96 Darling Cieslewicz Scientist, physiological signed by Phineas Inches Signature Date/Time: 06/10/2022/11:17:58 AM    Final    US Abdomen Limited RUQ (LIVER/GB)  Result Date: 06/09/2022 CLINICAL DATA:  81 year old female with acute abdominal pain. EXAM: ULTRASOUND ABDOMEN LIMITED RIGHT UPPER QUADRANT COMPARISON:  06/09/2022 CT FINDINGS: Gallbladder: There is no evidence of cholelithiasis, gallbladder wall thickening or sonographic Murphy sign. A small amount of pericholecystic fluid is identified. Common bile duct: Diameter: 6 mm. There is no evidence of intrahepatic or extrahepatic biliary dilatation. Liver: No focal lesion identified. Within normal limits in parenchymal echogenicity. Portal vein is patent on color Doppler imaging with normal direction of blood flow towards the liver. Other: None. IMPRESSION: 1. Small amount of nonspecific pericholecystic fluid. No evidence of cholelithiasis or other signs of acute cholecystitis. 2. No hepatic abnormalities identified.  No biliary dilatation. Electronically Signed   By: Margarette Canada M.D.   On: 06/09/2022 11:54   CT Angio Chest PE W and/or Wo Contrast  Result Date: 06/09/2022 CLINICAL DATA:  Suspected pulmonary embolus. Epigastric pain. Nausea vomiting. Shortness of breath. EXAM: CT ANGIOGRAPHY CHEST WITH CONTRAST  TECHNIQUE: Multidetector CT imaging of the chest was performed using the standard protocol during bolus administration of intravenous contrast. Multiplanar CT image reconstructions and MIPs were obtained to evaluate the vascular anatomy. RADIATION DOSE REDUCTION: This exam was performed according to the departmental dose-optimization program which includes automated exposure control, adjustment of the mA and/or kV according to patient size and/or use of iterative reconstruction technique. CONTRAST:  54mL OMNIPAQUE IOHEXOL 350 MG/ML SOLN COMPARISON:  CT scan of the chest December 08, 2021 FINDINGS: Cardiovascular: Atherosclerotic changes are identified in the nonaneurysmal thoracic aorta. Left coronary artery atherosclerotic change. The heart is stable with mild cardiomegaly. The main pulmonary artery measures up to 3.5 cm, stable. No pulmonary emboli. Mediastinum/Nodes: Tiny pleural effusions. No pericardial effusion. The chest wall is stable and unremarkable. The thyroid and esophagus are normal. No adenopathy. Lungs/Pleura: Right upper lobectomy. Remaining central airways are normal. No pneumothorax. Stable right perihilar radiation fibrosis. Stable emphysematous changes. Stable 2 mm subpleural nodule on the left on series 4, image 27. Radiation induced consolidation in the left base is stable. No acute infiltrate. No new nodule or mass. Upper Abdomen: No acute abnormality. Musculoskeletal: Stable compression fractures in the upper thoracic spine with vertebroplasties. No interval bony change. Review of the MIP images confirms the above findings. IMPRESSION: 1. No pulmonary emboli.  No acute abnormality. 2. Emphysema. 3. Atherosclerotic changes in the nonaneurysmal aorta. Coronary artery disease in the left coronary arteries. 4. The main pulmonary artery measures 3.5 cm which is mildly enlarged raising the possibility of pulmonary arterial hypertension. 5. Right upper lobectomy with post radiation change on the  right as above. 6. Consolidation in the left base is stable consistent with previously reported radiation induced consolidation. 7. Stable compression fractures and vertebroplasties in the upper thoracic spine. Aortic Atherosclerosis (ICD10-I70.0) and Emphysema (ICD10-J43.9). Electronically Signed   By: Dorise Bullion III M.D.   On: 06/09/2022 10:35   CT ABDOMEN PELVIS W CONTRAST  Result Date: 06/09/2022 CLINICAL  DATA:  81 year old female with history of nausea, vomiting and lower abdominal pain since yesterday. EXAM: CT ABDOMEN AND PELVIS WITH CONTRAST TECHNIQUE: Multidetector CT imaging of the abdomen and pelvis was performed using the standard protocol following bolus administration of intravenous contrast. RADIATION DOSE REDUCTION: This exam was performed according to the departmental dose-optimization program which includes automated exposure control, adjustment of the mA and/or kV according to patient size and/or use of iterative reconstruction technique. CONTRAST:  53mL OMNIPAQUE IOHEXOL 350 MG/ML SOLN COMPARISON:  CT the abdomen and pelvis 11/05/2020. FINDINGS: Lower chest: Atherosclerotic calcifications in the descending thoracic aorta. Chronic nodular area of architectural distortion in the base of the left lower lobe, stable compared to the prior examination, most compatible with an area of chronic post infectious or inflammatory scarring. Hepatobiliary: No suspicious cystic or solid hepatic lesions. No intra or extrahepatic biliary ductal dilatation. Mild periportal edema. No calcified gallstones are noted within the gallbladder. Gallbladder wall does appear edematous. No pericholecystic inflammatory changes. Pancreas: No pancreatic mass. No pancreatic ductal dilatation. No pancreatic or peripancreatic fluid collections or inflammatory changes. Spleen: Unremarkable. Adrenals/Urinary Tract: Left kidney is not visualized, either congenitally or surgically absent. Right kidney and bilateral adrenal  glands are normal in appearance. No hydroureteronephrosis. Urinary bladder is unremarkable in appearance. Stomach/Bowel: The appearance of the stomach is normal. There is no pathologic dilatation of small bowel or colon. Normal appendix. Vascular/Lymphatic: Extensive atherosclerosis in the abdominal aorta and pelvic vasculature, without evidence of aneurysm or dissection in the abdominal or pelvic vessels. No lymphadenopathy noted in the abdomen or pelvis. Reproductive: Uterus and ovaries are atrophic. Other: Small volume of ascites. No pneumoperitoneum. Musculoskeletal: There are no aggressive appearing lytic or blastic lesions noted in the visualized portions of the skeleton. IMPRESSION: 1. Periportal edema. Gallbladder wall is also edematous, without overt imaging findings to suggest an acute cholecystitis (and no definite calcified gallstones). Findings suggest intrinsic liver disease, and correlation with liver function tests is recommended. 2. Small volume of ascites. 3. Aortic atherosclerosis. 4. Additional incidental findings, as above. Electronically Signed   By: Vinnie Langton M.D.   On: 06/09/2022 10:30    Cardiac Studies   TTE 2017 Study Conclusions   - Left ventricle: The cavity size was normal. Wall thickness was    normal. Systolic function was normal. The estimated ejection    fraction was in the range of 60% to 65%. Doppler parameters are    consistent with abnormal left ventricular relaxation (grade 1    diastolic dysfunction).  - Mitral valve: There was mild regurgitation.  - Pericardium, extracardiac: A trivial pericardial effusion was    identified.    Myoview SPECT The left ventricular ejection fraction is hyperdynamic (>65%). Nuclear stress EF: 79%. There was no ST segment deviation noted during stress. The study is normal.   Normal stress nuclear study with no ischemia or infarction; EF 79 with normal wall motion  Patient Profile     Tammy Ball is a 81 y.o.  female with a hx of Stage IIIA NSCLC, SCC RUL, COPD on home O2  who is being seen 06/09/2022 for  NSTEMI at the request of Dr. Olevia Bowens.  Assessment & Plan    NSTEMI: we discussed at length that she has signs of a mild NSTEMI. Nausea/abd pain may have been anginal equivalent. CT PE showed LAD dx and EKG shows anterior deep TWI and inferiorlateral TWI. Likely LAD infarct. She feels well. She had no significant EKG changes. Troponins have down trended.  We discussed option of medical management vs. LHC. Her daughter had a cardiac arrest during a cath in the past; she is hesitant to take this risk if not absolutely necessary. This is very reasonable. Otherwise echo shows normal LV function. She is euvolemic. Will plan to medically manage and see her in follow-up. - echo shows normal LV function, no WMA - nearly finished with heparin gtt for 48 hrs  - continue aspirin 81 mg daily - started plavix 75 mg daily for 6 months to 1 year - continue atorvastatin 40 mg daily ; prior LDL at goal - continue coreg 25 mg BID ( increased from home dose)  2. AKI- likely prerenal now resolved.   3. Non small cell lung CA: She is not on an immune checkpoint inhibitor. No recurrence of her malignancy; in remission  4. HTN: continue home losartan. Increased coreg 25 mg BID per above  I'll arrange follow up in clinic with myself. Otherwise, once she has completed 48 hours of heparin she is stable from a cardiac standpoint for discharge.  For questions or updates, please contact [place] Please consult [email or web address] for contact info under        Signed, Janina Mayo, MD  06/11/2022, 8:59 AM

## 2022-06-11 NOTE — TOC Initial Note (Signed)
Transition of Care [facility]) - Initial/Assessment Note    Patient Details  Name: Tammy Ball MRN: [id number] Date of Birth: [birth year]  Transition of Care [facility]) CM/SW Contact:    Dessa Phi, RN Phone Number: 06/11/2022, 12:05 PM  Clinical Narrative: spoke to son [name]-d/c plan return home-patient still works, & drives. On 02-monitor.                  Expected Discharge Plan: Home/Self Care Barriers to Discharge: Continued Medical Work up   Patient Goals and CMS Choice Patient states their goals for this hospitalization and ongoing recovery are:: Home CMS Medicare.gov Compare Post Acute Care list provided to:: Patient Represenative (must comment) ([name](son)[phone]) Choice offered to / list presented to : Adult Children  Expected Discharge Plan and Services Expected Discharge Plan: Home/Self Care   Discharge Planning Services: CM Consult Post Acute Care Choice: NA Living arrangements for the past 2 months: Single Family Home                                      Prior Living Arrangements/Services Living arrangements for the past 2 months: Single Family Home Lives with:: Self Patient language and need for interpreter reviewed:: Yes Do you feel safe going back to the place where you live?: Yes      Need for Family Participation in Patient Care: Yes (Comment) Care giver support system in place?: Yes (comment)   Criminal Activity/Legal Involvement Pertinent to Current Situation/Hospitalization: No - Comment as needed  Activities of Daily Living Home Assistive Devices/Equipment: Grab bars in shower ADL Screening (condition at time of admission) Patient's cognitive ability adequate to safely complete daily activities?: No Is the patient deaf or have difficulty hearing?: No Does the patient have difficulty seeing, even when wearing glasses/contacts?: No Does the patient have difficulty concentrating, remembering, or making decisions?: No Patient able to express  need for assistance with ADLs?: Yes Does the patient have difficulty dressing or bathing?: No Independently performs ADLs?: Yes (appropriate for developmental age) Does the patient have difficulty walking or climbing stairs?: No Weakness of Legs: None Weakness of Arms/Hands: None  Permission Sought/Granted Permission sought to share information with : Case Manager Permission granted to share information with : Yes, Verbal Permission Granted  Share Information with NAME: Case Manager           Emotional Assessment Appearance:: Appears stated age Attitude/Demeanor/Rapport: Gracious Affect (typically observed): Accepting Orientation: : Oriented to Self, Oriented to Place, Oriented to  Time Alcohol / Substance Use: Not Applicable Psych Involvement: No (comment)  Admission diagnosis:  ACS (acute coronary syndrome) (HCC) [I24.9] NSTEMI (non-ST elevated myocardial infarction) [facility]) [I21.4] Patient Active Problem List   Diagnosis Date Noted   ACS (acute coronary syndrome) ([place]) 06/09/2022   Hyponatremia 06/09/2022   Epigastric pain 06/09/2022   Chronic bilateral low back pain without sciatica 09/06/2021   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 ([place]) 02/21/2021   Recurrent major depressive disorder, in full remission ([place]) 02/21/2021   Atherosclerosis of aorta ([place]) 02/21/2021   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   Chronic diastolic CHF (congestive heart failure) (HCC)    CKD (chronic kidney disease) stage 3, GFR 30-59 ml/min ([place]) 11/10/2020   Allergic rhinitis 01/06/2019   Chronic respiratory failure with hypoxia ([place]) 12/26/2018   Age-related osteoporosis with current pathological fracture 11/21/2018   Renal insufficiency 05/20/2018  History of compression fracture of spine 12/10/2016   Hypercalcemia 06/25/2016   Insomnia 05/16/2016   Pneumonitis, radiation ([place]) 04/11/2016   S/P lobectomy of lung 01/18/2016    Chemotherapy-induced neuropathy ([place]) 11/01/2015   Non-small cell carcinoma of right lung, stage 2 ([place]) 10/03/2015   Hyperlipidemia 08/14/2013   Spondylolisthesis of lumbar region 07/03/2013   Essential hypertension 12/17/2011   COPD GOLD B-C 12/17/2011   Renal cell cancer ([place]) 12/17/2011   Sciatica of left side 12/17/2011   Depression 12/17/2011   PCP:  Horald Pollen, MD Pharmacy:   [id number] - [place], [place] [address] Phone: [phone] Fax: [place] [address] Phone: [phone] Fax: [place] [place] [address] Phone: [phone] Fax: [phone]     Social Determinants of Health (SDOH) Interventions    Readmission Risk Interventions    11/11/2020   10:57 AM  Readmission Risk Prevention Plan  Transportation Screening Complete  PCP or Specialist Appt within 5-7 Days Complete  Home Care Screening Complete

## 2022-06-13 ENCOUNTER — Telehealth: Payer: Self-pay

## 2022-06-13 NOTE — Telephone Encounter (Signed)
Transition Care Management Follow-up Telephone Call Date of discharge and from where: 06/11/2022   Tammy Ball  How have you been since you were released from the hospital? Weak but better  Any questions or concerns? No  Items Reviewed: Did the pt receive and understand the discharge instructions provided? Yes  Medications obtained and verified? Yes  Other?  N/a Any new allergies since your discharge? No  Dietary orders reviewed? Yes Do you have support at home? Yes   Home Care and Equipment/Supplies: Were home health services ordered? no If so, what is the name of the agency? N/a  Has the agency set up a time to come to the patient's home? not applicable Were any new equipment or medical supplies ordered?  No What is the name of the medical supply agency? N/a Were you able to get the supplies/equipment? not applicable Do you have any questions related to the use of the equipment or supplies? No  Functional Questionnaire: (I = Independent and D = Dependent) ADLs: I  Bathing/Dressing- I  Meal Prep- I  Eating- I  Maintaining continence- I  Transferring/Ambulation- I  Managing Meds- I  Follow up appointments reviewed:  PCP Hospital f/u appt confirmed? Yes  Scheduled to see Dr.[name]  on 06/19/2022 @ 120pm. [facility] f/u appt confirmed? Yes  Scheduled to see Dr.Branch  on 06/28/2022 @ 1100. Are transportation arrangements needed? Yes  If their condition worsens, is the pt aware to call PCP or go to the Emergency Dept.? Yes Was the patient provided with contact information for the PCP's office or ED? Yes Was to pt encouraged to call back with questions or concerns? Yes

## 2022-06-18 NOTE — Progress Notes (Signed)
[facility] OFFICE PROGRESS NOTE  Horald Pollen, MD [place] [address]  DIAGNOSIS: Stage IIIA (T2a, N2, M0) non-small cell lung cancer, squamous cell carcinoma presented with large right upper lobe lung mass with right hilar involvement diagnosed in November 2016.  PRIOR THERAPY: 1) Neoadjuvant systemic chemotherapy with carboplatin for AUC of 6 and paclitaxel 200 MG/M2 every 3 weeks, status post 3 cycles with partial response. 2) Video bronchoscopy, right video-assisted thoracoscopy, minithoracotomy, right upper lobectomy with lymph node dissection on 01/18/2016. The final pathology revealed residual squamous cell carcinoma measuring 3.1 cm with close resection margin and metastatic carcinoma to lymph nodes at levels 10R, 12R and 4R. (ypT2a, yN2).  3) Concurrent chemoradiation with weekly carboplatin for AUC of 2 and paclitaxel 45 MG/M2. First dose 02/27/2016. Status post 5 cycles. Last dose was given 03/26/2016. 4) SBRT to left lower lobe lung nodule under the care of Dr. Sondra Come completed on 11/06/2016.  CURRENT THERAPY: Observation.   INTERVAL HISTORY: Tammy Ball 81 y.o. female returns to the clinic today for a 35-month follow-up visit.  She was last seen by Dr. Julien Nordmann on 12/25/21.  Unfortunately, since last being seen, the patient was recently found to have NSTEMI and she was discharged in the hospital on 06/11/2022.  She had a CT angiogram on 06/09/2022 which we will use as her most recent restaging scan. This was stable.  Since last being seen, the patient is feeling fairly well except for fatigue. She denies any recent fever, chills, night sweats, or unexplained weight loss.  She is currently on supplemental oxygen due to her COPD.  She reports her baseline dyspnea on exertion. She is on 2 L of supplemental oxygen.  Denies any cough, hemoptysis, or chest pain.  She was found to have nonspecific edema near her gallbladder when she was admitted to the  hospital.  She is having some intermittent right upper quadrant discomfort and nausea and she is expected to have a repeat right upper quadrant ultrasound on 06/27/22 was ordered by her PCP.  Denies any vomiting, diarrhea, or constipation denies any headache or visual changes.  She is here today for evaluation and to review her scan results.  MEDICAL HISTORY: Past Medical History:  Diagnosis Date   Anemia    was while doing chemo   Anxiety    Asthma    Chemotherapy-induced neuropathy ([place]) 11/01/2015   Claustrophobia    COPD (chronic obstructive pulmonary disease) ([place])    Depression    Encounter for antineoplastic chemotherapy 02/09/2016   Glaucoma    Headache    prior to menopause   Heart murmur    History of echocardiogram    Echo 2/17: EF 19-50%, grade 1 diastolic dysfunction, mild MR, trivial pericardial effusion   History of nuclear stress test    Myoview 2/17: no ischemia or scar, EF 79%; low risk   History of radiation therapy 10/30/16-11/06/16   Hyperlipidemia    Hypertension    Insomnia 05/16/2016   Non-small cell carcinoma of right lung, stage 2 ([place]) 10/03/2015   Radiation 02/29/16-04/10/16   50.4 Gy to right central chest   Renal cell carcinoma ([place])    L nephrectomy  in 2012    ALLERGIES:  is allergic to bee venom, amlodipine, levofloxacin, alendronate, and hctz [hydrochlorothiazide].  MEDICATIONS:  Current Outpatient Medications  Medication Sig Dispense Refill   acetaminophen (TYLENOL) 500 MG tablet Take 500 mg by mouth every 6 (six) hours as needed for headache (pain).  Reported on 05/24/2016     albuterol (PROVENTIL) (2.5 MG/3ML) 0.083% nebulizer solution Take 3 mLs (2.5 mg total) by nebulization every 6 (six) hours as needed for wheezing or shortness of breath. 75 mL 6   albuterol (VENTOLIN HFA) 108 (90 Base) MCG/ACT inhaler Inhale 1-2 puffs into the lungs every 6 (six) hours as needed for wheezing or shortness of breath. 1 each 6   Ascorbic Acid (VITAMIN C) 1000 MG  tablet Take 1,000 mg by mouth every morning.     aspirin EC 81 MG tablet Take 81 mg by mouth every morning.     atorvastatin (LIPITOR) 40 MG tablet TAKE 1 TABLET BY MOUTH EVERY DAY (Patient taking differently: Take 40 mg by mouth every morning.) 90 tablet 3   Calcium Carbonate Antacid (TUMS PO) Take 1 tablet by mouth every evening.     Carboxymethylcellulose Sodium (ARTIFICIAL TEARS OP) Place 1 drop into both eyes daily as needed (dry eyes).     carvedilol (COREG) 25 MG tablet Take 1 tablet (25 mg total) by mouth 2 (two) times daily. 60 tablet 0   Cholecalciferol (VITAMIN D3 PO) Take 1 tablet by mouth every morning.     clopidogrel (PLAVIX) 75 MG tablet Take 1 tablet (75 mg total) by mouth daily. 30 tablet 0   diclofenac Sodium (VOLTAREN) 1 % GEL Apply 1 Application topically at bedtime as needed (pain).     Fluticasone-Umeclidin-Vilant (TRELEGY ELLIPTA) 100-62.5-25 MCG/ACT AEPB Inhale 1 puff into the lungs daily. 60 each 1   Guaifenesin (MUCINEX MAXIMUM STRENGTH) 1200 MG TB12 Take 1,200 mg by mouth every morning.     losartan (COZAAR) 100 MG tablet TAKE 1 TABLET BY MOUTH EVERY DAY (Patient taking differently: Take 50 mg by mouth 2 (two) times daily.) 90 tablet 1   Multiple Vitamins-Minerals (ZINC PO) Take 1 tablet by mouth every morning.     Omega-3 Fatty Acids (FISH OIL) 1200 MG CAPS Take 1,200 mg by mouth every morning.     OVER THE COUNTER MEDICATION Apply 1 Application topically See admin instructions. Original vicks - apply topically to knees daily prn for pain     OXYGEN Inhale 2 L into the lungs as needed (to keep SATS at or above 90).     pantoprazole (PROTONIX) 40 MG tablet Take 1 tablet (40 mg total) by mouth daily. 30 tablet 3   sodium chloride (OCEAN) 0.65 % SOLN nasal spray Place 1 spray into both nostrils daily as needed for congestion.     traMADol (ULTRAM) 50 MG tablet Take 1 tablet (50 mg total) by mouth every 6 (six) hours as needed (BACK PAIN). 50 tablet 1   No current  facility-administered medications for this visit.    SURGICAL HISTORY:  Past Surgical History:  Procedure Laterality Date   BACK SURGERY     cervical 1991   EYE SURGERY     IR GENERIC HISTORICAL  01/16/2017   IR RADIOLOGIST EVAL & MGMT 01/16/2017 [facility]   IR GENERIC HISTORICAL  01/31/2017   IR FLUORO GUIDED NEEDLE PLC ASPIRATION/INJECTION LOC 01/31/2017 Luanne Bras, MD [facility]   IR GENERIC HISTORICAL  01/31/2017   IR VERTEBROPLASTY CERV/THOR BX INC UNI/BIL INC/INJECT/IMAGING 01/31/2017 Luanne Bras, MD [facility]   IR GENERIC HISTORICAL  01/31/2017   IR VERTEBROPLASTY EA ADDL (T&LS) BX INC UNI/BIL INC INJECT/IMAGING 01/31/2017 Luanne Bras, MD [facility]   kidney cancer     NEPHRECTOMY     SPINE SURGERY     TUBAL LIGATION  VIDEO ASSISTED THORACOSCOPY (VATS)/WEDGE RESECTION Right 01/18/2016   Procedure: VIDEO ASSISTED THORACOSCOPY (VATS)/LUNG RESECTION, THOROCOTOMY, RIGHT UPPER LOBECTOMY, LYMPH NODE DISSECTION, PLACEMENT OF ON Q;  Surgeon: [name], MD;  Location: [place];  Service: Thoracic;  Laterality: Right;   VIDEO BRONCHOSCOPY Bilateral 09/20/2015   Procedure: VIDEO BRONCHOSCOPY WITHOUT FLUORO;  Surgeon: Rigoberto Noel, MD;  Location: [facility];  Service: Cardiopulmonary;  Laterality: Bilateral;   VIDEO BRONCHOSCOPY N/A 01/18/2016   Procedure: VIDEO BRONCHOSCOPY;  Surgeon: [name], MD;  Location: [facility] OR;  Service: Thoracic;  Laterality: N/A;    REVIEW OF SYSTEMS:   Review of Systems  Constitutional: Positive for fatigue.  Negative for appetite change, chills, fever and unexpected weight change.  HENT:   Negative for mouth sores, nosebleeds, sore throat and trouble swallowing.   Eyes: Negative for eye problems and icterus.  Respiratory: Negative for cough, hemoptysis, shortness of breath and wheezing.   Cardiovascular: Negative for chest pain and leg swelling.  Gastrointestinal: Positive for intermittent right upper quadrant discomfort  and nausea.  Negative for constipation, diarrhea, and vomiting.  Genitourinary: Negative for bladder incontinence, difficulty urinating, dysuria, frequency and hematuria.   Musculoskeletal: Negative for back pain, gait problem, neck pain and neck stiffness.  Skin: Negative for itching and rash.  Neurological: Negative for dizziness, extremity weakness, gait problem, headaches, light-headedness and seizures.  Hematological: Negative for adenopathy. Does not bruise/bleed easily.  Psychiatric/Behavioral: Negative for confusion, depression and sleep disturbance. The patient is not nervous/anxious.     PHYSICAL EXAMINATION:  Blood pressure (!) 169/94, pulse 69, temperature 97.8 F (36.6 C), resp. rate 18, height 5' 3.5" (1.613 m), weight 172 lb 12.8 oz (78.4 kg), SpO2 93 %.  ECOG PERFORMANCE STATUS: 1  Physical Exam  Constitutional: Oriented to person, place, and time and well-developed, well-nourished, and in no distress.  HENT:  Head: Normocephalic and atraumatic.  Mouth/Throat: Oropharynx is clear and moist. No oropharyngeal exudate.  Eyes: Conjunctivae are normal. Right eye exhibits no discharge. Left eye exhibits no discharge. No scleral icterus.  Neck: Normal range of motion. Neck supple.  Cardiovascular: Normal rate, regular rhythm, normal heart sounds and intact distal pulses.  On 2 L of supplemental oxygen. Pulmonary/Chest: Effort normal and breath sounds normal. No respiratory distress. No wheezes. No rales.  Abdominal: Soft. Bowel sounds are normal. Exhibits no distension and no mass. There is no tenderness.  Musculoskeletal: Normal range of motion. Exhibits no edema.  Lymphadenopathy:    No cervical adenopathy.  Neurological: Alert and oriented to person, place, and time. Exhibits normal muscle tone. Gait normal. Coordination normal.  Skin: Skin is warm and dry. No rash noted. Not diaphoretic. No erythema. No pallor.  Psychiatric: Mood, memory and judgment normal.  Vitals  reviewed.  LABORATORY DATA: Lab Results  Component Value Date   WBC 6.4 06/19/2022   HGB 13.1 06/19/2022   HCT 40.8 06/19/2022   MCV 90.0 06/19/2022   PLT 279.0 06/19/2022      Chemistry      Component Value Date/Time   NA 138 06/19/2022 1411   NA 140 11/21/2018 1058   NA 140 11/11/2017 0939   K 5.1 06/19/2022 1411   K 4.6 11/11/2017 0939   CL 98 06/19/2022 1411   CO2 34 (H) 06/19/2022 1411   CO2 28 11/11/2017 0939   BUN 24 (H) 06/19/2022 1411   BUN 23 11/21/2018 1058   BUN 30.3 (H) 11/11/2017 0939   CREATININE 1.06 06/19/2022 1411   CREATININE 1.15 (H)  12/08/2021 0901   CREATININE 1.3 (H) 11/11/2017 0939   GLU 105 12/10/2009 0000      Component Value Date/Time   CALCIUM 10.2 06/19/2022 1411   CALCIUM 10.0 11/11/2017 0939   ALKPHOS 58 06/19/2022 1411   ALKPHOS 102 11/11/2017 0939   AST 20 06/19/2022 1411   AST 17 12/08/2021 0901   AST 18 11/11/2017 0939   ALT 19 06/19/2022 1411   ALT 16 12/08/2021 0901   ALT 19 11/11/2017 0939   BILITOT 0.7 06/19/2022 1411   BILITOT 0.6 12/08/2021 0901   BILITOT 0.42 11/11/2017 0939       RADIOGRAPHIC STUDIES:  ECHOCARDIOGRAM COMPLETE  Result Date: 06/10/2022    ECHOCARDIOGRAM REPORT   Patient Name:   Tammy Ball Date of Exam: 06/10/2022 Medical Rec #:  [id number]      Height:       63.5 in Accession #:    [id number]     Weight:       166.4 lb Date of Birth:  Dec 04, 1940      BSA:          1.799 m Patient Age:    69 years       BP:           108/66 mmHg Patient Gender: F              HR:           66 bpm. Exam Location:  Inpatient Procedure: 3D Echo, 2D Echo, Cardiac Doppler and Color Doppler Indications:    I25.110 Atherosclerotic heart disease of native coronary artery                 with unstable angina pectoris  History:        Patient has prior history of Echocardiogram examinations, most                 recent 01/09/2016. CHF, Abnormal ECG, COPD,                 Signs/Symptoms:Shortness of Breath and Dyspnea; Risk                  Factors:Hypertension and Dyslipidemia. Lung and renal cancer.  Sonographer:    Roseanna Rainbow RDCS Referring Phys: [id number] [place]  Sonographer Comments: Suboptimal apical window. IMPRESSIONS  1. Left ventricular ejection fraction, by estimation, is 60 to 65%. The left ventricle has normal function. The left ventricle has no regional wall motion abnormalities. Left ventricular diastolic parameters are consistent with Grade I diastolic dysfunction (impaired relaxation).  2. Right ventricular systolic function is normal. The right ventricular size is normal. Tricuspid regurgitation signal is inadequate for assessing PA pressure.  3. A small pericardial effusion is present.  4. The mitral valve is grossly normal. No evidence of mitral valve regurgitation.  5. The aortic valve was not well visualized. Aortic valve regurgitation is not visualized.  6. There is Moderate (Grade III) plaque.  7. The inferior vena cava is dilated in size with >50% respiratory variability, suggesting right atrial pressure of 8 mmHg. Comparison(s): No prior Echocardiogram. Conclusion(s)/Recommendation(s): Normal biventricular function without evidence of hemodynamically significant valvular heart disease. FINDINGS  Left Ventricle: Left ventricular ejection fraction, by estimation, is 60 to 65%. The left ventricle has normal function. The left ventricle has no regional wall motion abnormalities. The left ventricular internal cavity size was normal in size. There is  borderline left ventricular hypertrophy. Left ventricular diastolic parameters are consistent with Grade  I diastolic dysfunction (impaired relaxation). Right Ventricle: The right ventricular size is normal. No increase in right ventricular wall thickness. Right ventricular systolic function is normal. Tricuspid regurgitation signal is inadequate for assessing PA pressure. Left Atrium: Left atrial size was normal in size. Right Atrium: Right atrial size was normal in size.  Pericardium: A small pericardial effusion is present. Mitral Valve: The mitral valve is grossly normal. No evidence of mitral valve regurgitation. Tricuspid Valve: Tricuspid valve regurgitation is not demonstrated. Aortic Valve: The aortic valve was not well visualized. Aortic valve regurgitation is not visualized. Pulmonic Valve: Pulmonic valve regurgitation is not visualized. Aorta: The aortic root and ascending aorta are structurally normal, with no evidence of dilitation. There is moderate (Grade III) plaque. Venous: The inferior vena cava is dilated in size with greater than 50% respiratory variability, suggesting right atrial pressure of 8 mmHg. IAS/Shunts: No atrial level shunt detected by color flow Doppler.  LEFT VENTRICLE PLAX 2D LVIDd:         4.00 cm     Diastology LVIDs:         1.90 cm     LV e' medial:    5.40 cm/s LV PW:         1.00 cm     LV E/e' medial:  18.7 LV IVS:        1.10 cm     LV e' lateral:   6.15 cm/s LVOT diam:     2.10 cm     LV E/e' lateral: 16.4 LV SV:         99 LV SV Index:   55 LVOT Area:     3.46 cm                             3D Volume EF: LV Volumes (MOD)           3D EF:        72 % LV vol d, MOD A2C: 36.1 ml LV EDV:       62 ml LV vol d, MOD A4C: 38.4 ml LV ESV:       17 ml LV vol s, MOD A2C: 7.2 ml  LV SV:        44 ml LV vol s, MOD A4C: 8.6 ml LV SV MOD A2C:     28.9 ml LV SV MOD A4C:     38.4 ml LV SV MOD BP:      29.1 ml RIGHT VENTRICLE             IVC RV S prime:     12.10 cm/s  IVC diam: 2.60 cm TAPSE (M-mode): 1.8 cm LEFT ATRIUM             Index        RIGHT ATRIUM           Index LA diam:        2.10 cm 1.17 cm/m   RA Area:     15.40 cm LA Vol (A2C):   54.8 ml 30.46 ml/m  RA Volume:   33.10 ml  18.40 ml/m LA Vol (A4C):   21.0 ml 11.67 ml/m LA Biplane Vol: 34.4 ml 19.12 ml/m  AORTIC VALVE LVOT Vmax:   146.00 cm/s LVOT Vmean:  94.700 cm/s LVOT VTI:    0.285 m  AORTA Ao Root diam: 3.20 cm MITRAL VALVE MV Area (PHT): 3.67 cm     SHUNTS MV  Decel Time: 207 msec      Systemic VTI:  0.29 m MV E velocity: 101.00 cm/s  Systemic Diam: 2.10 cm MV A velocity: 105.00 cm/s MV E/A ratio:  0.96 Mary Scientist, physiological signed by Phineas Inches Signature Date/Time: 06/10/2022/11:17:58 AM    Final    US Abdomen Limited RUQ (LIVER/GB)  Result Date: 06/09/2022 CLINICAL DATA:  81 year old female with acute abdominal pain. EXAM: ULTRASOUND ABDOMEN LIMITED RIGHT UPPER QUADRANT COMPARISON:  06/09/2022 CT FINDINGS: Gallbladder: There is no evidence of cholelithiasis, gallbladder wall thickening or sonographic Murphy sign. A small amount of pericholecystic fluid is identified. Common bile duct: Diameter: 6 mm. There is no evidence of intrahepatic or extrahepatic biliary dilatation. Liver: No focal lesion identified. Within normal limits in parenchymal echogenicity. Portal vein is patent on color Doppler imaging with normal direction of blood flow towards the liver. Other: None. IMPRESSION: 1. Small amount of nonspecific pericholecystic fluid. No evidence of cholelithiasis or other signs of acute cholecystitis. 2. No hepatic abnormalities identified.  No biliary dilatation. Electronically Signed   By: Margarette Canada M.D.   On: 06/09/2022 11:54   CT Angio Chest PE W and/or Wo Contrast  Result Date: 06/09/2022 CLINICAL DATA:  Suspected pulmonary embolus. Epigastric pain. Nausea vomiting. Shortness of breath. EXAM: CT ANGIOGRAPHY CHEST WITH CONTRAST TECHNIQUE: Multidetector CT imaging of the chest was performed using the standard protocol during bolus administration of intravenous contrast. Multiplanar CT image reconstructions and MIPs were obtained to evaluate the vascular anatomy. RADIATION DOSE REDUCTION: This exam was performed according to the departmental dose-optimization program which includes automated exposure control, adjustment of the mA and/or kV according to patient size and/or use of iterative reconstruction technique. CONTRAST:  14mL OMNIPAQUE IOHEXOL 350 MG/ML SOLN COMPARISON:  CT  scan of the chest December 08, 2021 FINDINGS: Cardiovascular: Atherosclerotic changes are identified in the nonaneurysmal thoracic aorta. Left coronary artery atherosclerotic change. The heart is stable with mild cardiomegaly. The main pulmonary artery measures up to 3.5 cm, stable. No pulmonary emboli. Mediastinum/Nodes: Tiny pleural effusions. No pericardial effusion. The chest wall is stable and unremarkable. The thyroid and esophagus are normal. No adenopathy. Lungs/Pleura: Right upper lobectomy. Remaining central airways are normal. No pneumothorax. Stable right perihilar radiation fibrosis. Stable emphysematous changes. Stable 2 mm subpleural nodule on the left on series 4, image 27. Radiation induced consolidation in the left base is stable. No acute infiltrate. No new nodule or mass. Upper Abdomen: No acute abnormality. Musculoskeletal: Stable compression fractures in the upper thoracic spine with vertebroplasties. No interval bony change. Review of the MIP images confirms the above findings. IMPRESSION: 1. No pulmonary emboli.  No acute abnormality. 2. Emphysema. 3. Atherosclerotic changes in the nonaneurysmal aorta. Coronary artery disease in the left coronary arteries. 4. The main pulmonary artery measures 3.5 cm which is mildly enlarged raising the possibility of pulmonary arterial hypertension. 5. Right upper lobectomy with post radiation change on the right as above. 6. Consolidation in the left base is stable consistent with previously reported radiation induced consolidation. 7. Stable compression fractures and vertebroplasties in the upper thoracic spine. Aortic Atherosclerosis (ICD10-I70.0) and Emphysema (ICD10-J43.9). Electronically Signed   By: Dorise Bullion III M.D.   On: 06/09/2022 10:35   CT ABDOMEN PELVIS W CONTRAST  Result Date: 06/09/2022 CLINICAL DATA:  81 year old female with history of nausea, vomiting and lower abdominal pain since yesterday. EXAM: CT ABDOMEN AND PELVIS WITH  CONTRAST TECHNIQUE: Multidetector CT imaging of the abdomen and pelvis was performed using the standard protocol  following bolus administration of intravenous contrast. RADIATION DOSE REDUCTION: This exam was performed according to the departmental dose-optimization program which includes automated exposure control, adjustment of the mA and/or kV according to patient size and/or use of iterative reconstruction technique. CONTRAST:  50mL OMNIPAQUE IOHEXOL 350 MG/ML SOLN COMPARISON:  CT the abdomen and pelvis 11/05/2020. FINDINGS: Lower chest: Atherosclerotic calcifications in the descending thoracic aorta. Chronic nodular area of architectural distortion in the base of the left lower lobe, stable compared to the prior examination, most compatible with an area of chronic post infectious or inflammatory scarring. Hepatobiliary: No suspicious cystic or solid hepatic lesions. No intra or extrahepatic biliary ductal dilatation. Mild periportal edema. No calcified gallstones are noted within the gallbladder. Gallbladder wall does appear edematous. No pericholecystic inflammatory changes. Pancreas: No pancreatic mass. No pancreatic ductal dilatation. No pancreatic or peripancreatic fluid collections or inflammatory changes. Spleen: Unremarkable. Adrenals/Urinary Tract: Left kidney is not visualized, either congenitally or surgically absent. Right kidney and bilateral adrenal glands are normal in appearance. No hydroureteronephrosis. Urinary bladder is unremarkable in appearance. Stomach/Bowel: The appearance of the stomach is normal. There is no pathologic dilatation of small bowel or colon. Normal appendix. Vascular/Lymphatic: Extensive atherosclerosis in the abdominal aorta and pelvic vasculature, without evidence of aneurysm or dissection in the abdominal or pelvic vessels. No lymphadenopathy noted in the abdomen or pelvis. Reproductive: Uterus and ovaries are atrophic. Other: Small volume of ascites. No  pneumoperitoneum. Musculoskeletal: There are no aggressive appearing lytic or blastic lesions noted in the visualized portions of the skeleton. IMPRESSION: 1. Periportal edema. Gallbladder wall is also edematous, without overt imaging findings to suggest an acute cholecystitis (and no definite calcified gallstones). Findings suggest intrinsic liver disease, and correlation with liver function tests is recommended. 2. Small volume of ascites. 3. Aortic atherosclerosis. 4. Additional incidental findings, as above. Electronically Signed   By: Vinnie Langton M.D.   On: 06/09/2022 10:30   DG Chest 2 View  Result Date: 06/09/2022 CLINICAL DATA:  Epigastric pain, chest pain. EXAM: CHEST - 2 VIEW COMPARISON:  November 05, 2020 and December 08, 2021 FINDINGS: EKG leads project over the chest. Cardiomediastinal contours are stable. Mild cardiac enlargement. No lobar consolidative process. Graded opacity at the LEFT and RIGHT lung base. Added density behind the LEFT heart border grossly similar to scout image from the January CT. No pneumothorax. Vertebroplasty changes in the upper thoracic spine. No gross acute process of skeletal structures to the extent evaluated. IMPRESSION: 1. Graded opacity at the LEFT and RIGHT lung base more likely related overlapping soft tissues. Small effusions are possible. No sign of lobar consolidation. 2. Nodular density in the retrocardiac region in the LEFT lower chest likely related to post treatment changes documented on previous chest CT. Electronically Signed   By: Zetta Bills M.D.   On: 06/09/2022 08:12     ASSESSMENT/PLAN:  This is a very pleasant 81 year old Caucasian female diagnosed with stage III non-small cell lung cancer, squamous cell carcinoma.  The patient is status post neoadjuvant systemic chemotherapy with carboplatin and paclitaxel.  She had a partial response to treatment.  This was followed by a right upper lobectomy and lymph node dissection.  She was found to  have evidence of metastatic disease to the mediastinal lymph nodes close to the resection margin.  Therefore, she then underwent a course of concurrent chemoradiation with weekly carboplatin for an AUC of 2 and paclitaxel 45 mg per metered square.  She was also treated with SBRT to the left  lower lobe pulmonary nodule under the care of Dr. Sondra Come.  The patient has been on observation for about 5 years.  The patient recently had a CT angiogram performed on 06/09/2022.  Dr. Julien Nordmann personally and independently reviewed the scan discussed results with the patient today.  The scan shows no evidence of disease progression.   Dr. Julien Nordmann recommends CT scan of the chest in 6 months.  We will see her back for follow-up visit at that time for evaluation to review her scan results.  She will continue to follow closely with pulmonology for COPD and cardiology due to her recent NSTEMI.  She will follow up with her PCP with the RUQ ultrasound scheduled on 06/27/22 to follow up on her intermittent abdominal discomfort. No pain at this time. She states this occurs most often in the evening.   The patient was advised to call immediately if she has any concerning symptoms in the interval. The patient voices understanding of current disease status and treatment options and is in agreement with the current care plan. All questions were answered. The patient knows to call the clinic with any problems, questions or concerns. We can certainly see the patient much sooner if necessary       Orders Placed This Encounter  Procedures   CT Chest Wo Contrast    Standing Status:   Future    Standing Expiration Date:   06/25/2023    Order Specific Question:   Preferred imaging location?    Answer:   [facility]   CBC with Differential ([place] Only)    Standing Status:   Future    Standing Expiration Date:   06/26/2023   CMP ([place] only)    Standing Status:   Future    Standing Expiration  Date:   06/26/2023      Tobe Sos Yasha Tibbett, PA-C 06/25/22  ADDENDUM: Hematology/Oncology Attending: I had a face-to-face encounter with the patient today.  I reviewed her records, lab, scan and recommended her care plan.  This is a very pleasant 81 years old white female with history of stage IIIa non-small cell lung cancer, squamous cell carcinoma status post neoadjuvant systemic chemotherapy with carboplatin and paclitaxel with partial response followed by right upper extremities lymph node dissection and then the patient was found to have evidence of disease recurrence in the mediastinal lymph node close to the resection margin and she was treated with another course of concurrent chemoradiation and then SBRT to left lower lobe pulmonary nodule.  She has been on observation since that time and she is feeling fine. She was admitted to the hospital recently with NSTEMI and during her hospitalization she had repeat CT scan of the chest, abdomen and pelvis that showed no concerning findings for disease progression. I discussed the scan results with the patient and recommended for her to continue on observation with repeat CT scan of the chest in 6 months. She was advised to call immediately if she has any other concerning symptoms in the interval. The total time spent in the appointment was 20 minutes.  Disclaimer: This note was dictated with voice recognition software. Similar sounding words can inadvertently be transcribed and may be missed upon review. Eilleen Kempf, MD

## 2022-06-19 ENCOUNTER — Ambulatory Visit (INDEPENDENT_AMBULATORY_CARE_PROVIDER_SITE_OTHER): Payer: Medicare Other | Admitting: Emergency Medicine

## 2022-06-19 ENCOUNTER — Encounter: Payer: Self-pay | Admitting: Emergency Medicine

## 2022-06-19 VITALS — BP 142/98 | HR 65 | Temp 98.5°F | Ht 63.5 in | Wt 172.4 lb

## 2022-06-19 DIAGNOSIS — K219 Gastro-esophageal reflux disease without esophagitis: Secondary | ICD-10-CM | POA: Diagnosis not present

## 2022-06-19 DIAGNOSIS — C3491 Malignant neoplasm of unspecified part of right bronchus or lung: Secondary | ICD-10-CM | POA: Diagnosis not present

## 2022-06-19 DIAGNOSIS — I10 Essential (primary) hypertension: Secondary | ICD-10-CM

## 2022-06-19 DIAGNOSIS — Z09 Encounter for follow-up examination after completed treatment for conditions other than malignant neoplasm: Secondary | ICD-10-CM | POA: Diagnosis not present

## 2022-06-19 DIAGNOSIS — N1831 Chronic kidney disease, stage 3a: Secondary | ICD-10-CM

## 2022-06-19 DIAGNOSIS — I700 Atherosclerosis of aorta: Secondary | ICD-10-CM

## 2022-06-19 DIAGNOSIS — R1013 Epigastric pain: Secondary | ICD-10-CM | POA: Diagnosis not present

## 2022-06-19 DIAGNOSIS — I214 Non-ST elevation (NSTEMI) myocardial infarction: Secondary | ICD-10-CM | POA: Insufficient documentation

## 2022-06-19 DIAGNOSIS — K828 Other specified diseases of gallbladder: Secondary | ICD-10-CM | POA: Insufficient documentation

## 2022-06-19 DIAGNOSIS — J449 Chronic obstructive pulmonary disease, unspecified: Secondary | ICD-10-CM | POA: Diagnosis not present

## 2022-06-19 LAB — CBC WITH DIFFERENTIAL/PLATELET
Basophils Absolute: 0.1 10*3/uL (ref 0.0–0.1)
Basophils Relative: 1.4 % (ref 0.0–3.0)
Eosinophils Absolute: 0.2 10*3/uL (ref 0.0–0.7)
Eosinophils Relative: 3.2 % (ref 0.0–5.0)
HCT: 40.8 % (ref 36.0–46.0)
Hemoglobin: 13.1 g/dL (ref 12.0–15.0)
Lymphocytes Relative: 27.9 % (ref 12.0–46.0)
Lymphs Abs: 1.8 10*3/uL (ref 0.7–4.0)
MCHC: 32.2 g/dL (ref 30.0–36.0)
MCV: 90 fl (ref 78.0–100.0)
Monocytes Absolute: 0.6 10*3/uL (ref 0.1–1.0)
Monocytes Relative: 9 % (ref 3.0–12.0)
Neutro Abs: 3.7 10*3/uL (ref 1.4–7.7)
Neutrophils Relative %: 58.5 % (ref 43.0–77.0)
Platelets: 279 10*3/uL (ref 150.0–400.0)
RBC: 4.53 Mil/uL (ref 3.87–5.11)
RDW: 18.9 % — ABNORMAL HIGH (ref 11.5–15.5)
WBC: 6.4 10*3/uL (ref 4.0–10.5)

## 2022-06-19 LAB — COMPREHENSIVE METABOLIC PANEL
ALT: 19 U/L (ref 0–35)
AST: 20 U/L (ref 0–37)
Albumin: 4.4 g/dL (ref 3.5–5.2)
Alkaline Phosphatase: 58 U/L (ref 39–117)
BUN: 24 mg/dL — ABNORMAL HIGH (ref 6–23)
CO2: 34 mEq/L — ABNORMAL HIGH (ref 19–32)
Calcium: 10.2 mg/dL (ref 8.4–10.5)
Chloride: 98 mEq/L (ref 96–112)
Creatinine, Ser: 1.06 mg/dL (ref 0.40–1.20)
GFR: 50.23 mL/min — ABNORMAL LOW (ref >60.00)
Glucose, Bld: 106 mg/dL — ABNORMAL HIGH (ref 70–99)
Potassium: 5.1 mEq/L (ref 3.5–5.1)
Sodium: 138 mEq/L (ref 135–145)
Total Bilirubin: 0.7 mg/dL (ref 0.2–1.2)
Total Protein: 7.1 g/dL (ref 6.0–8.3)

## 2022-06-19 MED ORDER — PANTOPRAZOLE SODIUM 40 MG PO TBEC: 40.0000 mg | DELAYED_RELEASE_TABLET | Freq: Every day | ORAL | 3 refills | Status: DC

## 2022-06-19 NOTE — Assessment & Plan Note (Signed)
Needs repeat gallbladder ultrasound and assess presence of gallbladder edema.

## 2022-06-19 NOTE — Assessment & Plan Note (Signed)
Well-controlled hypertension. Continue losartan 100 mg daily and Coreg 25 mg twice a day.

## 2022-06-19 NOTE — Assessment & Plan Note (Signed)
Advised to stay well-hydrated and avoid NSAIDs. ?

## 2022-06-19 NOTE — Assessment & Plan Note (Signed)
Differential diagnosis discussed. Multiple factors contributing. Has history of GERD and ultrasound showed fluid around the gallbladder without stones.

## 2022-06-19 NOTE — Assessment & Plan Note (Signed)
Stable.  Oxygen dependent.

## 2022-06-19 NOTE — Patient Instructions (Signed)
Health Maintenance After Age 79 After age 79, you are at a higher risk for certain long-term diseases and infections as well as injuries from falls. Falls are a major cause of broken bones and head injuries in people who are older than age 79. Getting regular preventive care can help to keep you healthy and well. Preventive care includes getting regular testing and making lifestyle changes as recommended by your health care provider. Talk with your health care provider about: Which screenings and tests you should have. A screening is a test that checks for a disease when you have no symptoms. A diet and exercise plan that is right for you. What should I know about screenings and tests to prevent falls? Screening and testing are the best ways to find a health problem early. Early diagnosis and treatment give you the best chance of managing medical conditions that are common after age 79. Certain conditions and lifestyle choices may make you more likely to have a fall. Your health care provider may recommend: Regular vision checks. Poor vision and conditions such as cataracts can make you more likely to have a fall. If you wear glasses, make sure to get your prescription updated if your vision changes. Medicine review. Work with your health care provider to regularly review all of the medicines you are taking, including over-the-counter medicines. Ask your health care provider about any side effects that may make you more likely to have a fall. Tell your health care provider if any medicines that you take make you feel dizzy or sleepy. Strength and balance checks. Your health care provider may recommend certain tests to check your strength and balance while standing, walking, or changing positions. Foot health exam. Foot pain and numbness, as well as not wearing proper footwear, can make you more likely to have a fall. Screenings, including: Osteoporosis screening. Osteoporosis is a condition that causes  the bones to get weaker and break more easily. Blood pressure screening. Blood pressure changes and medicines to control blood pressure can make you feel dizzy. Depression screening. You may be more likely to have a fall if you have a fear of falling, feel depressed, or feel unable to do activities that you used to do. Alcohol use screening. Using too much alcohol can affect your balance and may make you more likely to have a fall. Follow these instructions at home: Lifestyle Do not drink alcohol if: Your health care provider tells you not to drink. If you drink alcohol: Limit how much you have to: 0-1 drink a day for women. 0-2 drinks a day for men. Know how much alcohol is in your drink. In the [place] one drink equals one 12 oz bottle of beer (355 mL), one 5 oz glass of wine (148 mL), or one 1 oz glass of hard liquor (44 mL). Do not use any products that contain nicotine or tobacco. These products include cigarettes, chewing tobacco, and vaping devices, such as e-cigarettes. If you need help quitting, ask your health care provider. Activity  Follow a regular exercise program to stay fit. This will help you maintain your balance. Ask your health care provider what types of exercise are appropriate for you. If you need a cane or walker, use it as recommended by your health care provider. Wear supportive shoes that have nonskid soles. Safety  Remove any tripping hazards, such as rugs, cords, and clutter. Install safety equipment such as grab bars in bathrooms and safety rails on stairs. Keep rooms and walkways   well-lit. General instructions Talk with your health care provider about your risks for falling. Tell your health care provider if: You fall. Be sure to tell your health care provider about all falls, even ones that seem minor. You feel dizzy, tiredness (fatigue), or off-balance. Take over-the-counter and prescription medicines only as told by your health care provider. These include  supplements. Eat a healthy diet and maintain a healthy weight. A healthy diet includes low-fat dairy products, low-fat (lean) meats, and fiber from whole grains, beans, and lots of fruits and vegetables. Stay current with your vaccines. Schedule regular health, dental, and eye exams. Summary Having a healthy lifestyle and getting preventive care can help to protect your health and wellness after age 79. Screening and testing are the best way to find a health problem early and help you avoid having a fall. Early diagnosis and treatment give you the best chance for managing medical conditions that are more common for people who are older than age 79. Falls are a major cause of broken bones and head injuries in people who are older than age 79. Take precautions to prevent a fall at home. Work with your health care provider to learn what changes you can make to improve your health and wellness and to prevent falls. This information is not intended to replace advice given to you by your health care provider. Make sure you discuss any questions you have with your health care provider. Document Revised: 03/20/2021 Document Reviewed: 03/20/2021 [employer]vier Patient Education  2023 [employer]  

## 2022-06-19 NOTE — Assessment & Plan Note (Signed)
Stable and recovering well.  Clinically stable. Continue daily baby aspirin and Plavix 75 mg daily. Continue Coreg 25 mg twice a day. Follow-up with cardiology as scheduled

## 2022-06-19 NOTE — Progress Notes (Signed)
Tammy Ball 81 y.o.   Chief Complaint  Patient presents with   Hospitalization Follow-up    HISTORY OF PRESENT ILLNESS: This is a 81 y.o. female here for follow-up of recent hospital discharge. Was admitted on 06/09/2022 with difficulty breathing.  Released on 06/11/2022 Discharge summary as follows: Physician Discharge Summary  SHAILENE DEMONBREUN VOJ:[id number] DOB: 1941-06-25 DOA: 06/09/2022   PCP: Horald Pollen, MD   Admit date: 06/09/2022 Discharge date: 06/11/2022   Admitted From: Home Disposition:  Home   Recommendations for Outpatient Follow-up:  Follow up with PCP in 1-2 weeks Please obtain BMP/CBC in one week your next doctors visit.  Cont ASA, Plavix daily added Coreg increased 25mg  BID. Cont Losartan  Cont Lipitor  Follow up outpatient with Cardiology.      Discharge Condition: Stable CODE STATUS: Full  Diet recommendation: Cardiac Diet.    Brief/Interim Summary:    81 y.o. female with medical history significant of non-small cell right lung carcinoma, renal cell carcinoma with left nephrectomy, chemo-induced anemia, anxiety, depression, claustrophobia, asthma/COPD, glaucoma, headaches, grade 1 diastolic dysfunction, hyperlipidemia, hypertension, insomnia who presented to the emergency department due to substernal/epigastric pain, nausea and vomiting.  Patient was found to have elevated troponin, EKG was unremarkable.  CTA chest negative for PE but showed evidence of coronary artery disease, PAH, emphysema.  Right upper quadrant ultrasound was unremarkable. Patient deferred LHC for now, she received 48hrs of Heparin and stable for dc with meds as mentioned above cleared by cardiology.  Stable for dc.     Assessment & Plan:  Principal Problem:   ACS (acute coronary syndrome) (HCC) Active Problems:   Hypercalcemia   Essential hypertension   COPD GOLD B-C   Spondylolisthesis of lumbar region   Hyperlipidemia   Atherosclerosis of aorta (HCC)    Hyponatremia   Epigastric pain   NSTEMI; resolved.  -Received aspirin.  Continue this.  IV heparin drip.  Trops peaked 694. Seen by cardiology team.  Echocardiogram  stable. LDL 67. Patient and cardiology discussed conservative management. Cont ASA, Losartan. Plavix added, Coreg increased. Completed 48hrs of heparin drip.      Epigastric pain; resolved.  Right upper quadrant ultrasound negative.    AKI; resolved.  - Baseline creatinine 0.9.  Cr peaked at 1.4     Hyponatremia Resolved     Hypercalcemia Likely from dehydration.  Resolved     Essential hypertension On Coreg 25 mg twice daily, cont losartan.  IV as needed Lopressor and hydralazine   History of non-small cell lung cancer - In remission     COPD GOLD B-C Bronchodilators, I-S/flutter valve     Spondylolisthesis of lumbar region Pain control and bowel regimen     Hyperlipidemia Statin     Atherosclerosis of aorta (HCC) Aspirin and statin      HPI   Prior to Admission medications   Medication Sig Start Date End Date Taking? Authorizing Provider  acetaminophen (TYLENOL) 500 MG tablet Take 500 mg by mouth every 6 (six) hours as needed for headache (pain). Reported on 05/24/2016   Yes [provider]  albuterol (PROVENTIL) (2.5 MG/3ML) 0.083% nebulizer solution Take 3 mLs (2.5 mg total) by nebulization every 6 (six) hours as needed for wheezing or shortness of breath. 01/09/22  Yes Collene Gobble, MD  albuterol (VENTOLIN HFA) 108 (90 Base) MCG/ACT inhaler Inhale 1-2 puffs into the lungs every 6 (six) hours as needed for wheezing or shortness of breath. 10/25/21  Yes Horald Pollen, MD  Ascorbic Acid (VITAMIN C) 1000 MG tablet Take 1,000 mg by mouth every morning.   Yes [provider]  aspirin EC 81 MG tablet Take 81 mg by mouth every morning.   Yes [provider]  atorvastatin (LIPITOR) 40 MG tablet TAKE 1 TABLET BY MOUTH EVERY DAY Patient taking differently: Take 40 mg by  mouth every morning. 05/08/22  Yes Kynnadi Dicenso, Ines Bloomer, MD  Calcium Carbonate Antacid (TUMS PO) Take 1 tablet by mouth every evening.   Yes [provider]  Carboxymethylcellulose Sodium (ARTIFICIAL TEARS OP) Place 1 drop into both eyes daily as needed (dry eyes).   Yes [provider]  carvedilol (COREG) 25 MG tablet Take 1 tablet (25 mg total) by mouth 2 (two) times daily. 06/11/22  Yes Amin, Jeanella Flattery, MD  Cholecalciferol (VITAMIN D3 PO) Take 1 tablet by mouth every morning.   Yes [provider]  clopidogrel (PLAVIX) 75 MG tablet Take 1 tablet (75 mg total) by mouth daily. 06/12/22  Yes Amin, Jeanella Flattery, MD  diclofenac Sodium (VOLTAREN) 1 % GEL Apply 1 Application topically at bedtime as needed (pain).   Yes [provider]  Fluticasone-Umeclidin-Vilant (TRELEGY ELLIPTA) 100-62.5-25 MCG/ACT AEPB Inhale 1 puff into the lungs daily. 01/09/22  Yes Collene Gobble, MD  Guaifenesin [facility] MAXIMUM STRENGTH) 1200 MG TB12 Take 1,200 mg by mouth every morning.   Yes [provider]  losartan (COZAAR) 100 MG tablet TAKE 1 TABLET BY MOUTH EVERY DAY Patient taking differently: Take 50 mg by mouth 2 (two) times daily. 04/26/22  Yes Viriginia Amendola, Ines Bloomer, MD  Multiple Vitamins-Minerals (ZINC PO) Take 1 tablet by mouth every morning.   Yes [provider]  Omega-3 Fatty Acids (FISH OIL) 1200 MG CAPS Take 1,200 mg by mouth every morning.   Yes [provider]  OVER THE COUNTER MEDICATION Apply 1 Application topically See admin instructions. Original vicks - apply topically to knees daily prn for pain   Yes [provider]  OXYGEN Inhale 2 L into the lungs as needed (to keep SATS at or above 90).   Yes [provider]  sodium chloride (OCEAN) 0.65 % SOLN nasal spray Place 1 spray into both nostrils daily as needed for congestion.   Yes [provider]  traMADol (ULTRAM) 50 MG tablet Take 1 tablet (50 mg total) by mouth  every 6 (six) hours as needed (BACK PAIN). 09/06/21  Yes Aryeh Butterfield, Ines Bloomer, MD    Allergies  Allergen Reactions   Bee Venom Anaphylaxis, Shortness Of Breath, Swelling and Other (See Comments)    Swelling at site (reaction to bees and wasps)   Amlodipine Swelling and Other (See Comments)    Swelling of the ankles and hands    Levofloxacin Other (See Comments)    Joint pain    Alendronate Other (See Comments)    Joint pains / hypercalcemia    Hctz [Hydrochlorothiazide] Palpitations and Other (See Comments)    Sweating     Patient Active Problem List   Diagnosis Date Noted   ACS (acute coronary syndrome) ([place]) 06/09/2022   Hyponatremia 06/09/2022   Epigastric pain 06/09/2022   Chronic bilateral low back pain without sciatica 09/06/2021   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 ([place]) 02/21/2021   Recurrent major depressive disorder, in full remission ([place]) 02/21/2021   Atherosclerosis of aorta ([place]) 02/21/2021   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   Chronic diastolic CHF (congestive heart failure) ([place])  CKD (chronic kidney disease) stage 3, GFR 30-59 ml/min (HCC) 11/10/2020   Allergic rhinitis 01/06/2019   Chronic respiratory failure with hypoxia ([place]) 12/26/2018   Age-related osteoporosis with current pathological fracture 11/21/2018   Renal insufficiency 05/20/2018   History of compression fracture of spine 12/10/2016   Hypercalcemia 06/25/2016   Insomnia 05/16/2016   Pneumonitis, radiation ([place]) 04/11/2016   S/P lobectomy of lung 01/18/2016   Chemotherapy-induced neuropathy ([place]) 11/01/2015   Non-small cell carcinoma of right lung, stage 2 ([place]) 10/03/2015   Hyperlipidemia 08/14/2013   Spondylolisthesis of lumbar region 07/03/2013   Essential hypertension 12/17/2011   COPD GOLD B-C 12/17/2011   Renal cell cancer ([place]) 12/17/2011   Sciatica of left side 12/17/2011   Depression 12/17/2011    Past Medical History:   Diagnosis Date   Anemia    was while doing chemo   Anxiety    Asthma    Chemotherapy-induced neuropathy ([place]) 11/01/2015   Claustrophobia    COPD (chronic obstructive pulmonary disease) ([place])    Depression    Encounter for antineoplastic chemotherapy 02/09/2016   Glaucoma    Headache    prior to menopause   Heart murmur    History of echocardiogram    Echo 2/17: EF 93-26%, grade 1 diastolic dysfunction, mild MR, trivial pericardial effusion   History of nuclear stress test    Myoview 2/17: no ischemia or scar, EF 79%; low risk   History of radiation therapy 10/30/16-11/06/16   Hyperlipidemia    Hypertension    Insomnia 05/16/2016   Non-small cell carcinoma of right lung, stage 2 ([place]) 10/03/2015   Radiation 02/29/16-04/10/16   50.4 Gy to right central chest   Renal cell carcinoma ([place])    L nephrectomy  in 2012    Past Surgical History:  Procedure Laterality Date   BACK SURGERY     cervical 1991   EYE SURGERY     IR GENERIC HISTORICAL  01/16/2017   IR RADIOLOGIST EVAL & MGMT 01/16/2017 [facility]   IR GENERIC HISTORICAL  01/31/2017   IR FLUORO GUIDED NEEDLE PLC ASPIRATION/INJECTION LOC 01/31/2017 Luanne Bras, MD [facility]   IR GENERIC HISTORICAL  01/31/2017   IR VERTEBROPLASTY CERV/THOR BX INC UNI/BIL INC/INJECT/IMAGING 01/31/2017 Luanne Bras, MD [facility]   IR GENERIC HISTORICAL  01/31/2017   IR VERTEBROPLASTY EA ADDL (T&LS) BX INC UNI/BIL INC INJECT/IMAGING 01/31/2017 Luanne Bras, MD [facility]   kidney cancer     NEPHRECTOMY     SPINE SURGERY     TUBAL LIGATION     VIDEO ASSISTED THORACOSCOPY (VATS)/WEDGE RESECTION Right 01/18/2016   Procedure: VIDEO ASSISTED THORACOSCOPY (VATS)/LUNG RESECTION, THOROCOTOMY, RIGHT UPPER LOBECTOMY, LYMPH NODE DISSECTION, PLACEMENT OF ON Q;  Surgeon: [name], MD;  Location: [facility];  Service: Thoracic;  Laterality: Right;   VIDEO BRONCHOSCOPY Bilateral 09/20/2015   Procedure: VIDEO BRONCHOSCOPY WITHOUT FLUORO;   Surgeon: Rigoberto Noel, MD;  Location: [facility];  Service: Cardiopulmonary;  Laterality: Bilateral;   VIDEO BRONCHOSCOPY N/A 01/18/2016   Procedure: VIDEO BRONCHOSCOPY;  Surgeon: [name], MD;  Location[facility];  Service: Thoracic;  Laterality: N/A;    Social History   Socioeconomic History   Marital status: Widowed    Spouse name: Not on file   Number of children: 4   Years of education: Not on file   Highest education level: High school graduate  Occupational History   Not on file  Tobacco Use   Smoking status: Former  Packs/day: 1.00    Years: 50.00    Total pack years: 50.00    Types: Cigarettes    Quit date: 03/16/2011    Years since quitting: 11.2   Smokeless tobacco: Never   Tobacco comments:    last 4-5 years of smoking, smoked 0.5 pack/day   Vaping Use   Vaping Use: Never used  Substance and Sexual Activity   Alcohol use: No    Alcohol/week: 0.0 standard drinks of alcohol   Drug use: No   Sexual activity: Not Currently  Other Topics Concern   Not on file  Social History Narrative   Marital status: divorced; not dating in 2019.      Children: 4 children; 3 grandchildren adult; 4 gg.      Lives: alone in house      Employment: part-time work; substance abuse counselor; [employer].      Tobacco: quit smoking 2012; smoked 45 years      Alcohol: none      Drugs: none      Exercise: none in 2019; due to LEFT sciatica.      ADLs: independent with ADLs; drives.       Advanced Directives: YES: HCPOA: Nicholas Martinez/son.  FULL CODE but no prolonged measures.      Occupation: Substance Abuse Estate agent   No exercise** Merged History Encounter **       ** Data from: 12/14/11 Enc Dept: [facility]       ** Data from: 12/17/11 Enc Dept: [facility]   Substance abuse counselor   Husband deceased   4 great grandchildren   Son works in same substance abuse counseling center as patient   Social  Determinants of Radio broadcast assistant Strain: Low Risk  (05/28/2022)   Overall Financial Resource Strain (CARDIA)    Difficulty of Paying Living Expenses: Not hard at all  Food Insecurity: No Food Insecurity (05/28/2022)   Hunger Vital Sign    Worried About Running Out of Food in the Last Year: Never true    Ran Out of Food in the Last Year: Never true  Transportation Needs: No Transportation Needs (05/28/2022)   PRAPARE - Hydrologist (Medical): No    Lack of Transportation (Non-Medical): No  Physical Activity: Insufficiently Active (05/28/2022)   Exercise Vital Sign    Days of Exercise per Week: 7 days    Minutes of Exercise per Session: 20 min  Stress: No Stress Concern Present (05/28/2022)   [place]    Feeling of Stress : Not at all  Social Connections: Moderately Integrated (05/18/2021)   Social Connection and Isolation Panel [NHANES]    Frequency of Communication with Friends and Family: More than three times a week    Frequency of Social Gatherings with Friends and Family: More than three times a week    Attends Religious Services: More than 4 times per year    Active Member of [employer] or Organizations: No    Attends Music therapist: More than 4 times per year    Marital Status: Divorced  Human resources officer Violence: Not At Risk (05/18/2021)   Humiliation, Afraid, Rape, and Kick questionnaire    Fear of Current or Ex-Partner: No    Emotionally Abused: No    Physically Abused: No    Sexually Abused: No    Family History  Problem  Relation Age of Onset   Heart disease Sister    Obesity Brother    Heart attack Daughter 63       s/p CABG   Glaucoma Daughter    Breast cancer Sister    Birth defects Sister    Cancer Mother        Bladder Cancer   Hypertension Mother    CAD Mother 1   Cancer Maternal Grandmother      Review of Systems  Constitutional: Negative.   Negative for chills and fever.  HENT: Negative.  Negative for congestion and sore throat.   Respiratory: Negative.  Negative for cough and shortness of breath.   Cardiovascular: Negative.  Negative for chest pain and palpitations.  Gastrointestinal:  Positive for abdominal pain. Negative for nausea and vomiting.  Genitourinary: Negative.   Skin: Negative.  Negative for rash.  Neurological:  Negative for dizziness and headaches.  All other systems reviewed and are negative.  Today's Vitals   06/19/22 1316 06/19/22 1322  BP: (!) 162/100 (!) 142/98  Pulse: 65   Temp: 98.5 F (36.9 C)   TempSrc: Oral   SpO2: 94%   Weight: 172 lb 6 oz (78.2 kg)   Height: 5' 3.5" (1.613 m)    Body mass index is 30.06 kg/m. Wt Readings from Last 3 Encounters:  06/19/22 172 lb 6 oz (78.2 kg)  06/09/22 166 lb 7.2 oz (75.5 kg)  05/28/22 180 lb (81.6 kg)     Physical Exam Vitals reviewed.  Constitutional:      Appearance: Normal appearance.     Comments: On portable oxygen  HENT:     Head: Normocephalic.  Eyes:     Extraocular Movements: Extraocular movements intact.     Pupils: Pupils are equal, round, and reactive to light.  Cardiovascular:     Rate and Rhythm: Normal rate and regular rhythm.     Pulses: Normal pulses.     Heart sounds: Normal heart sounds.  Pulmonary:     Effort: Pulmonary effort is normal.     Breath sounds: Normal breath sounds.  Abdominal:     Palpations: Abdomen is soft.     Tenderness: There is no abdominal tenderness.  Musculoskeletal:     Cervical back: No tenderness.  Lymphadenopathy:     Cervical: No cervical adenopathy.  Skin:    General: Skin is warm and dry.     Capillary Refill: Capillary refill takes less than 2 seconds.  Neurological:     General: No focal deficit present.     Mental Status: She is alert and oriented to person, place, and time.  Psychiatric:        Mood and Affect: Mood normal.        Behavior: Behavior normal.       ASSESSMENT & PLAN: A total of 47 minutes was spent with the patient and counseling/coordination of care regarding preparing for this visit, review of most recent office visit notes, review of most recent hospital discharge summary notes, review of all medications, review of multiple chronic medical problems and their management, education on nutrition, review of most recent blood work results, prognosis, documentation, and need for follow-up.  Problem List Items Addressed This Visit       Cardiovascular and Mediastinum   Essential hypertension    Well-controlled hypertension. Continue losartan 100 mg daily and Coreg 25 mg twice a day.      Atherosclerosis of aorta (HCC)    Continue atorvastatin 40 mg daily.  Non-STEMI (non-ST elevated myocardial infarction) ([place])    Stable and recovering well.  Clinically stable. Continue daily baby aspirin and Plavix 75 mg daily. Continue Coreg 25 mg twice a day. Follow-up with cardiology as scheduled        Respiratory   COPD GOLD B-C    Stable.  Oxygen dependent.      Non-small cell carcinoma of right lung, stage 2 (HCC)     Digestive   Gastroesophageal reflux disease without esophagitis    Symptomatic.  We will start pantoprazole 40 mg daily for 30 days.      Relevant Medications   pantoprazole (PROTONIX) 40 MG tablet     Genitourinary   Stage 3a chronic kidney disease ([place])    Advised to stay well-hydrated and avoid NSAIDs.        Other   Epigastric pain    Differential diagnosis discussed. Multiple factors contributing. Has history of GERD and ultrasound showed fluid around the gallbladder without stones.      Thickening of wall of gallbladder with pericholecystic fluid    Needs repeat gallbladder ultrasound and assess presence of gallbladder edema.      Relevant Orders   Comprehensive metabolic panel (Completed)   CBC with Differential/Platelet (Completed)   US Abdomen Limited RUQ (LIVER/GB)   Other  Visit Diagnoses     Hospital discharge follow-up    -  Primary      Patient Instructions  Health Maintenance After Age 58 After age 28, you are at a higher risk for certain long-term diseases and infections as well as injuries from falls. Falls are a major cause of broken bones and head injuries in people who are older than age 52. Getting regular preventive care can help to keep you healthy and well. Preventive care includes getting regular testing and making lifestyle changes as recommended by your health care provider. Talk with your health care provider about: Which screenings and tests you should have. A screening is a test that checks for a disease when you have no symptoms. A diet and exercise plan that is right for you. What should I know about screenings and tests to prevent falls? Screening and testing are the best ways to find a health problem early. Early diagnosis and treatment give you the best chance of managing medical conditions that are common after age 42. Certain conditions and lifestyle choices may make you more likely to have a fall. Your health care provider may recommend: Regular vision checks. Poor vision and conditions such as cataracts can make you more likely to have a fall. If you wear glasses, make sure to get your prescription updated if your vision changes. Medicine review. Work with your health care provider to regularly review all of the medicines you are taking, including over-the-counter medicines. Ask your health care provider about any side effects that may make you more likely to have a fall. Tell your health care provider if any medicines that you take make you feel dizzy or sleepy. Strength and balance checks. Your health care provider may recommend certain tests to check your strength and balance while standing, walking, or changing positions. Foot health exam. Foot pain and numbness, as well as not wearing proper footwear, can make you more likely to have a  fall. Screenings, including: Osteoporosis screening. Osteoporosis is a condition that causes the bones to get weaker and break more easily. Blood pressure screening. Blood pressure changes and medicines to control blood pressure can make you feel dizzy. Depression  screening. You may be more likely to have a fall if you have a fear of falling, feel depressed, or feel unable to do activities that you used to do. Alcohol use screening. Using too much alcohol can affect your balance and may make you more likely to have a fall. Follow these instructions at home: Lifestyle Do not drink alcohol if: Your health care provider tells you not to drink. If you drink alcohol: Limit how much you have to: 0-1 drink a day for women. 0-2 drinks a day for men. Know how much alcohol is in your drink. In the [place] one drink equals one 12 oz bottle of beer (355 mL), one 5 oz glass of wine (148 mL), or one 1 oz glass of hard liquor (44 mL). Do not use any products that contain nicotine or tobacco. These products include cigarettes, chewing tobacco, and vaping devices, such as e-cigarettes. If you need help quitting, ask your health care provider. Activity  Follow a regular exercise program to stay fit. This will help you maintain your balance. Ask your health care provider what types of exercise are appropriate for you. If you need a cane or walker, use it as recommended by your health care provider. Wear supportive shoes that have nonskid soles. Safety  Remove any tripping hazards, such as rugs, cords, and clutter. Install safety equipment such as grab bars in bathrooms and safety rails on stairs. Keep rooms and walkways well-lit. General instructions Talk with your health care provider about your risks for falling. Tell your health care provider if: You fall. Be sure to tell your health care provider about all falls, even ones that seem minor. You feel dizzy, tiredness (fatigue), or off-balance. Take  over-the-counter and prescription medicines only as told by your health care provider. These include supplements. Eat a healthy diet and maintain a healthy weight. A healthy diet includes low-fat dairy products, low-fat (lean) meats, and fiber from whole grains, beans, and lots of fruits and vegetables. Stay current with your vaccines. Schedule regular health, dental, and eye exams. Summary Having a healthy lifestyle and getting preventive care can help to protect your health and wellness after age 60. Screening and testing are the best way to find a health problem early and help you avoid having a fall. Early diagnosis and treatment give you the best chance for managing medical conditions that are more common for people who are older than age 1. Falls are a major cause of broken bones and head injuries in people who are older than age 55. Take precautions to prevent a fall at home. Work with your health care provider to learn what changes you can make to improve your health and wellness and to prevent falls. This information is not intended to replace advice given to you by your health care provider. Make sure you discuss any questions you have with your health care provider. Document Revised: 03/20/2021 Document Reviewed: 03/20/2021 Elsevier Patient Education  [place], MD [facility]

## 2022-06-19 NOTE — Assessment & Plan Note (Signed)
Symptomatic.  We will start pantoprazole 40 mg daily for 30 days.

## 2022-06-19 NOTE — Assessment & Plan Note (Signed)
Continue atorvastatin 40 mg daily.  

## 2022-06-20 ENCOUNTER — Encounter: Payer: Self-pay | Admitting: Emergency Medicine

## 2022-06-25 ENCOUNTER — Encounter: Payer: Self-pay | Admitting: Physician Assistant

## 2022-06-25 ENCOUNTER — Other Ambulatory Visit: Payer: Self-pay

## 2022-06-25 ENCOUNTER — Inpatient Hospital Stay: Payer: Medicare Other

## 2022-06-25 ENCOUNTER — Inpatient Hospital Stay: Payer: Medicare Other | Attending: Physician Assistant | Admitting: Physician Assistant

## 2022-06-25 VITALS — BP 169/94 | HR 69 | Temp 97.8°F | Resp 18 | Ht 63.5 in | Wt 172.8 lb

## 2022-06-25 DIAGNOSIS — C3491 Malignant neoplasm of unspecified part of right bronchus or lung: Secondary | ICD-10-CM

## 2022-06-25 DIAGNOSIS — I110 Hypertensive heart disease with heart failure: Secondary | ICD-10-CM | POA: Diagnosis not present

## 2022-06-25 DIAGNOSIS — Z79899 Other long term (current) drug therapy: Secondary | ICD-10-CM | POA: Insufficient documentation

## 2022-06-25 DIAGNOSIS — R188 Other ascites: Secondary | ICD-10-CM | POA: Diagnosis not present

## 2022-06-25 DIAGNOSIS — R011 Cardiac murmur, unspecified: Secondary | ICD-10-CM | POA: Insufficient documentation

## 2022-06-25 DIAGNOSIS — I252 Old myocardial infarction: Secondary | ICD-10-CM | POA: Diagnosis not present

## 2022-06-25 DIAGNOSIS — Z85528 Personal history of other malignant neoplasm of kidney: Secondary | ICD-10-CM | POA: Diagnosis not present

## 2022-06-25 DIAGNOSIS — Z7982 Long term (current) use of aspirin: Secondary | ICD-10-CM | POA: Diagnosis not present

## 2022-06-25 DIAGNOSIS — I10 Essential (primary) hypertension: Secondary | ICD-10-CM | POA: Insufficient documentation

## 2022-06-25 DIAGNOSIS — Z923 Personal history of irradiation: Secondary | ICD-10-CM | POA: Insufficient documentation

## 2022-06-25 DIAGNOSIS — C3411 Malignant neoplasm of upper lobe, right bronchus or lung: Secondary | ICD-10-CM | POA: Insufficient documentation

## 2022-06-25 DIAGNOSIS — E785 Hyperlipidemia, unspecified: Secondary | ICD-10-CM | POA: Diagnosis not present

## 2022-06-25 DIAGNOSIS — J439 Emphysema, unspecified: Secondary | ICD-10-CM | POA: Insufficient documentation

## 2022-06-25 DIAGNOSIS — J449 Chronic obstructive pulmonary disease, unspecified: Secondary | ICD-10-CM | POA: Diagnosis not present

## 2022-06-25 DIAGNOSIS — I700 Atherosclerosis of aorta: Secondary | ICD-10-CM | POA: Insufficient documentation

## 2022-06-27 ENCOUNTER — Ambulatory Visit
Admission: RE | Admit: 2022-06-27 | Discharge: 2022-06-27 | Disposition: A | Discharge status: Home / Self Care | Payer: Medicare Other | Source: Ambulatory Visit | Attending: Emergency Medicine | Admitting: Emergency Medicine

## 2022-06-27 ENCOUNTER — Inpatient Hospital Stay: Payer: Medicare Other | Admitting: Internal Medicine

## 2022-06-27 DIAGNOSIS — K828 Other specified diseases of gallbladder: Secondary | ICD-10-CM

## 2022-06-28 ENCOUNTER — Encounter: Payer: Self-pay | Admitting: Internal Medicine

## 2022-06-28 ENCOUNTER — Ambulatory Visit (INDEPENDENT_AMBULATORY_CARE_PROVIDER_SITE_OTHER): Payer: Medicare Other | Admitting: Internal Medicine

## 2022-06-28 VITALS — BP 153/80 | HR 61 | Ht 63.5 in | Wt 175.8 lb

## 2022-06-28 DIAGNOSIS — R195 Other fecal abnormalities: Secondary | ICD-10-CM | POA: Diagnosis not present

## 2022-06-28 DIAGNOSIS — I214 Non-ST elevation (NSTEMI) myocardial infarction: Secondary | ICD-10-CM | POA: Diagnosis not present

## 2022-06-28 NOTE — Patient Instructions (Addendum)
Medication Instructions:  No Changes In Medications at this time.  *If you need a refill on your cardiac medications before your next appointment, please call your pharmacy*  Lab Work: [place] TO OBTAIN A TESTING KIT  If you have labs (blood work) drawn today and your tests are completely normal, you will receive your results only by: MyChart Message (if you have MyChart) OR A paper copy in the mail If you have any lab test that is abnormal or we need to change your treatment, we will call you to review the results.  Testing/Procedures: None Ordered At This Time.   Follow-Up: At [facility], you and your health needs are our priority.  As part of our continuing mission to provide you with exceptional heart care, we have created designated Provider Care Teams.  These Care Teams include your primary Cardiologist (physician) and Advanced Practice Providers (APPs -  Physician Assistants and Nurse Practitioners) who all work together to provide you with the care you need, when you need it.  Your next appointment:   6 month(s)  The format for your next appointment:   In Person  Provider:   Janina Mayo, MD

## 2022-06-28 NOTE — Progress Notes (Signed)
Cardiology Office Note:    Date:  06/29/2022   ID:  Tammy Ball, DOB November 13, 1940, MRN [id number]  PCP:  Horald Pollen, [place] Prov[facility] Cardiologist:  Janina Mayo, MD     Referring MD: Horald Pollen, *   No chief complaint on file. Hospital FU  History of Present Illness:    Tammy Ball is a 81 y.o. female with a hx of Stage IIIA NSCLC, SCC RUL, COPD on home O2. She presented to the hospital 06/09/2022 with N/V. EKG showed progressive inferior; anterolateral TWI. Troponin was elevated to the 600s. She was medically managed for NSTEMI. We discussed LHC in the hospital. She was apprehensive. Considering her low risk ACS presentation, opted to continue with medical management. She was started on DAPT. Echo showed normal LV function. No valve dx.   Today, she reports stomach pain. She saw her PCP Dr. [name]. She had abd [place] that did not show signs of cholecystitis. LFTs are normal   Stool is dark black which she reported in the hospital. Hgb is normal. She denies chest pressure. Has some fatigue. She's on chronic O2.  Blood pressures 1teens at home.   Past Medical History:  Diagnosis Date   Anemia    was while doing chemo   Anxiety    Asthma    Chemotherapy-induced neuropathy ([place]) 11/01/2015   Claustrophobia    COPD (chronic obstructive pulmonary disease) ([place])    Depression    Encounter for antineoplastic chemotherapy 02/09/2016   Glaucoma    Headache    prior to menopause   Heart murmur    History of echocardiogram    Echo 2/17: EF 19-50%, grade 1 diastolic dysfunction, mild MR, trivial pericardial effusion   History of nuclear stress test    Myoview 2/17: no ischemia or scar, EF 79%; low risk   History of radiation therapy 10/30/16-11/06/16   Hyperlipidemia    Hypertension    Insomnia 05/16/2016   Non-small cell carcinoma of right lung, stage 2 ([place]) 10/03/2015   Radiation 02/29/16-04/10/16   50.4 Gy to right central chest   Renal  cell carcinoma ([place])    L nephrectomy  in 2012    Past Surgical History:  Procedure Laterality Date   BACK SURGERY     cervical 1991   EYE SURGERY     IR GENERIC HISTORICAL  01/16/2017   IR RADIOLOGIST EVAL & MGMT 01/16/2017 [facility]   IR GENERIC HISTORICAL  01/31/2017   IR FLUORO GUIDED NEEDLE PLC ASPIRATION/INJECTION LOC 01/31/2017 Luanne Bras, MD [facility]   IR GENERIC HISTORICAL  01/31/2017   IR VERTEBROPLASTY CERV/THOR BX INC UNI/BIL INC/INJECT/IMAGING 01/31/2017 Luanne Bras, MD [facility]   IR GENERIC HISTORICAL  01/31/2017   IR VERTEBROPLASTY EA ADDL (T&LS) BX INC UNI/BIL INC INJECT/IMAGING 01/31/2017 Luanne Bras, MD [facility]   kidney cancer     NEPHRECTOMY     SPINE SURGERY     TUBAL LIGATION     VIDEO ASSISTED THORACOSCOPY (VATS)/WEDGE RESECTION Right 01/18/2016   Procedure: VIDEO ASSISTED THORACOSCOPY (VATS)/LUNG RESECTION, THOROCOTOMY, RIGHT UPPER LOBECTOMY, LYMPH NODE DISSECTION, PLACEMENT OF ON Q;  Surgeon: [name], MD;  Location: [facility];  Service: Thoracic;  Laterality: Right;   VIDEO BRONCHOSCOPY Bilateral 09/20/2015   Procedure: VIDEO BRONCHOSCOPY WITHOUT FLUORO;  Surgeon: Rigoberto Noel, MD;  Location: [facility];  Service: Cardiopulmonary;  Laterality: Bilateral;   VIDEO BRONCHOSCOPY N/A 01/18/2016   Procedure: VIDEO BRONCHOSCOPY;  Surgeon:  [name], MD;  Location: [facility];  Service: Thoracic;  Laterality: N/A;    Current Medications: Current Meds  Medication Sig   acetaminophen (TYLENOL) 500 MG tablet Take 500 mg by mouth every 6 (six) hours as needed for headache (pain). Reported on 05/24/2016   albuterol (PROVENTIL) (2.5 MG/3ML) 0.083% nebulizer solution Take 3 mLs (2.5 mg total) by nebulization every 6 (six) hours as needed for wheezing or shortness of breath.   albuterol (VENTOLIN HFA) 108 (90 Base) MCG/ACT inhaler Inhale 1-2 puffs into the lungs every 6 (six) hours as needed for wheezing or shortness of breath.   Ascorbic  Acid (VITAMIN C) 1000 MG tablet Take 1,000 mg by mouth every morning.   aspirin EC 81 MG tablet Take 81 mg by mouth every morning.   atorvastatin (LIPITOR) 40 MG tablet TAKE 1 TABLET BY MOUTH EVERY DAY (Patient taking differently: Take 40 mg by mouth every morning.)   Calcium Carbonate Antacid (TUMS PO) Take 1 tablet by mouth every evening.   Carboxymethylcellulose Sodium (ARTIFICIAL TEARS OP) Place 1 drop into both eyes daily as needed (dry eyes).   carvedilol (COREG) 25 MG tablet Take 1 tablet (25 mg total) by mouth 2 (two) times daily.   Cholecalciferol (VITAMIN D3 PO) Take 1 tablet by mouth every morning.   clopidogrel (PLAVIX) 75 MG tablet Take 1 tablet (75 mg total) by mouth daily.   diclofenac Sodium (VOLTAREN) 1 % GEL Apply 1 Application topically at bedtime as needed (pain).   Fluticasone-Umeclidin-Vilant (TRELEGY ELLIPTA) 100-62.5-25 MCG/ACT AEPB Inhale 1 puff into the lungs daily.   Guaifenesin (MUCINEX MAXIMUM STRENGTH) 1200 MG TB12 Take 1,200 mg by mouth every morning.   losartan (COZAAR) 100 MG tablet TAKE 1 TABLET BY MOUTH EVERY DAY (Patient taking differently: Take 50 mg by mouth 2 (two) times daily.)   Multiple Vitamins-Minerals (ZINC PO) Take 1 tablet by mouth every morning.   Omega-3 Fatty Acids (FISH OIL) 1200 MG CAPS Take 1,200 mg by mouth every morning.   OVER THE COUNTER MEDICATION Apply 1 Application topically See admin instructions. Original vicks - apply topically to knees daily prn for pain   OXYGEN Inhale 2 L into the lungs as needed (to keep SATS at or above 90).   pantoprazole (PROTONIX) 40 MG tablet Take 1 tablet (40 mg total) by mouth daily.   sodium chloride (OCEAN) 0.65 % SOLN nasal spray Place 1 spray into both nostrils daily as needed for congestion.   traMADol (ULTRAM) 50 MG tablet Take 1 tablet (50 mg total) by mouth every 6 (six) hours as needed (BACK PAIN).     Allergies:   Bee venom, Amlodipine, Levofloxacin, Alendronate, and Hctz [hydrochlorothiazide]    Social History   Socioeconomic History   Marital status: Widowed    Spouse name: Not on file   Number of children: 4   Years of education: Not on file   Highest education level: High school graduate  Occupational History   Not on file  Tobacco Use   Smoking status: Former    Packs/day: 1.00    Years: 50.00    Total pack years: 50.00    Types: Cigarettes    Quit date: 03/16/2011    Years since quitting: 11.2   Smokeless tobacco: Never   Tobacco comments:    last 4-5 years of smoking, smoked 0.5 pack/day   Vaping Use   Vaping Use: Never used  Substance and Sexual Activity   Alcohol use: No    Alcohol/week:  0.0 standard drinks of alcohol   Drug use: No   Sexual activity: Not Currently  Other Topics Concern   Not on file  Social History Narrative   Marital status: divorced; not dating in 2019.      Children: 4 children; 3 grandchildren adult; 4 gg.      Lives: alone in house      Employment: part-time work; substance abuse counselor; [employer].      Tobacco: quit smoking 2012; smoked 45 years      Alcohol: none      Drugs: none      Exercise: none in 2019; due to LEFT sciatica.      ADLs: independent with ADLs; drives.       Advanced Directives: YES: HCPOA: Nicholas Martinez/son.  FULL CODE but no prolonged measures.      Occupation: Substance Abuse Estate agent   No exercise** Merged History Encounter **       ** Data from: 12/14/11 Enc Dept: [facility]       ** Data from: 12/17/11 Enc Dept: [facility]   Substance abuse counselor   Husband deceased   4 great grandchildren   Son works in same substance abuse counseling center as patient   Social Determinants of Radio broadcast assistant Strain: Low Risk  (05/28/2022)   Overall Financial Resource Strain (CARDIA)    Difficulty of Paying Living Expenses: Not hard at all  Food Insecurity: No Food Insecurity (05/28/2022)   Hunger Vital Sign    Worried About  Running Out of Food in the Last Year: Never true    Ran Out of Food in the Last Year: Never true  Transportation Needs: No Transportation Needs (05/28/2022)   PRAPARE - Hydrologist (Medical): No    Lack of Transportation (Non-Medical): No  Physical Activity: Insufficiently Active (05/28/2022)   Exercise Vital Sign    Days of Exercise per Week: 7 days    Minutes of Exercise per Session: 20 min  Stress: No Stress Concern Present (05/28/2022)   [place]    Feeling of Stress : Not at all  Social Connections: Moderately Integrated (05/18/2021)   Social Connection and Isolation Panel [NHANES]    Frequency of Communication with Friends and Family: More than three times a week    Frequency of Social Gatherings with Friends and Family: More than three times a week    Attends Religious Services: More than 4 times per year    Active Member of [employer] or Organizations: No    Attends Music therapist: More than 4 times per year    Marital Status: Divorced     Family History: The patient's family history includes Birth defects in her sister; Breast cancer in her sister; CAD (age of onset: 57) in her mother; Cancer in her maternal grandmother and mother; Glaucoma in her daughter; Heart attack (age of onset: 76) in her daughter; Heart disease in her sister; Hypertension in her mother; Obesity in her brother.  ROS:   Please see the history of present illness.     All other systems reviewed and are negative.  EKGs/Labs/Other Studies Reviewed:    The following studies were reviewed today:   EKG:  EKG is  ordered today.  The ekg ordered today demonstrates   06/28/2022-NSR, inferior Q, poor r wave progression c/f anterior infarct  Recent  Labs: 06/11/2022: Magnesium 2.0 06/19/2022: ALT 19; BUN 24; Creatinine, Ser 1.06; Hemoglobin 13.1; Platelets 279.0; Potassium 5.1; Sodium 138  Recent Lipid Panel     Component Value Date/Time   CHOL 131 06/10/2022 0243   CHOL 163 11/21/2018 1058   TRIG 62 06/10/2022 0243   HDL 52 06/10/2022 0243   HDL 87 11/21/2018 1058   CHOLHDL 2.5 06/10/2022 0243   VLDL 12 06/10/2022 0243   LDLCALC 67 06/10/2022 0243   LDLCALC 61 11/21/2018 1058     Risk Assessment/Calculations:           Physical Exam:    VS:  BP (!) 153/80   Pulse 61   Ht 5' 3.5" (1.613 m)   Wt 175 lb 12.8 oz (79.7 kg)   SpO2 97%   BMI 30.65 kg/m     Wt Readings from Last 3 Encounters:  06/28/22 175 lb 12.8 oz (79.7 kg)  06/25/22 172 lb 12.8 oz (78.4 kg)  06/19/22 172 lb 6 oz (78.2 kg)     GEN:  Well nourished, well developed in no acute distress HEENT: Normal NECK: No JVD;  LYMPHATICS: No lymphadenopathy CARDIAC: RRR, no murmurs, rubs, gallops RESPIRATORY:  Clear to auscultation without rales, wheezing or rhonchi  ABDOMEN: Soft, non-tender, non-distended MUSCULOSKELETAL:  No edema; No deformity  SKIN: Warm and dry NEUROLOGIC:  Alert and oriented x 3 PSYCHIATRIC:  Normal affect   ASSESSMENT:    NSTEMI: Medically managed noted dark stool prior with nl hgb. Will get guaiac today. Otherwise she is asymptomatic.  -continue DAPT ; if stool is guaiac + can stop plavix -continue lipitor 40 mg daily (LDL 67 mg/dL at goal)  HTN: well controlled at home.  Continue coreg 25 mg BID, losartan, she's taking 50 mg BID.  Cardio Onc: Hx of NSCLC with large right upper lobe mass diagnosed 09/2015. S/p carboplatin and paclitaxel therapy last doses in 2017. S/p 50 Gy R central chest. Has not received high cardiotoxic risk therapy. Lower risk of XRT cardiotoxicity with central chest location PLAN:    In order of problems listed above:  Guaiac stool  If positive can stop plavix Follow up 6 mo           Medication Adjustments/Labs and Tests Ordered: Current medicines are reviewed at length with the patient today.  Concerns regarding medicines are outlined above.  Orders  Placed This Encounter  Procedures   Fecal occult blood, imunochemical   EKG 12-Lead   No orders of the defined types were placed in this encounter.   Patient Instructions  Medication Instructions:  No Changes In Medications at this time.  *If you need a refill on your cardiac medications before your next appointment, please call your pharmacy*  Lab Work: [place] TO OBTAIN A TESTING KIT  If you have labs (blood work) drawn today and your tests are completely normal, you will receive your results only by: MyChart Message (if you have MyChart) OR A paper copy in the mail If you have any lab test that is abnormal or we need to change your treatment, we will call you to review the results.  Testing/Procedures: None Ordered At This Time.   Follow-Up: At [facility], you and your health needs are our priority.  As part of our continuing mission to provide you with exceptional heart care, we have created designated Provider Care Teams.  These Care Teams include your primary Cardiologist (physician) and Advanced Practice Providers (  APPs -  Physician Assistants and Nurse Practitioners) who all work together to provide you with the care you need, when you need it.  Your next appointment:   6 month(s)  The format for your next appointment:   In Person  Provider:   Janina Mayo, MD     Signed, Janina Mayo, MD  06/29/2022 12:56 PM    [place]

## 2022-06-29 DIAGNOSIS — I214 Non-ST elevation (NSTEMI) myocardial infarction: Secondary | ICD-10-CM | POA: Diagnosis not present

## 2022-06-29 DIAGNOSIS — R195 Other fecal abnormalities: Secondary | ICD-10-CM | POA: Diagnosis not present

## 2022-07-02 ENCOUNTER — Ambulatory Visit: Payer: Medicare Other | Admitting: Internal Medicine

## 2022-07-03 LAB — FECAL OCCULT BLOOD, IMMUNOCHEMICAL: Fecal Occult Bld: POSITIVE — AB

## 2022-07-04 ENCOUNTER — Other Ambulatory Visit: Payer: Self-pay | Admitting: Internal Medicine

## 2022-07-04 ENCOUNTER — Other Ambulatory Visit: Payer: Self-pay | Admitting: *Deleted

## 2022-07-04 ENCOUNTER — Other Ambulatory Visit: Payer: Self-pay | Admitting: Emergency Medicine

## 2022-07-04 DIAGNOSIS — I214 Non-ST elevation (NSTEMI) myocardial infarction: Secondary | ICD-10-CM

## 2022-07-04 DIAGNOSIS — K922 Gastrointestinal hemorrhage, unspecified: Secondary | ICD-10-CM

## 2022-07-04 MED ORDER — CARVEDILOL 25 MG PO TABS: 25.0000 mg | ORAL_TABLET | Freq: Two times a day (BID) | ORAL | 3 refills | Status: DC

## 2022-07-04 NOTE — Progress Notes (Signed)
Call patient please.  GI consult/referral placed today for evaluation of lower GI bleed with positive Hemoccult test.  Thanks.

## 2022-07-05 ENCOUNTER — Encounter: Payer: Self-pay | Admitting: Gastroenterology

## 2022-07-06 ENCOUNTER — Encounter: Payer: Self-pay | Admitting: Gastroenterology

## 2022-07-09 ENCOUNTER — Telehealth: Payer: Self-pay | Admitting: Gastroenterology

## 2022-07-09 NOTE — Telephone Encounter (Signed)
Called patient to reschedule her appt she requested to speak with a nurse said she has been having black stool. Seeking advise.

## 2022-07-09 NOTE — Telephone Encounter (Signed)
PCP sent in referral for GIB. Patient states she is having black stools & occasional abdominal pain. Patient was originally suppose to be seen with Dr. Tarri Glenn tomorrow, however provider will be out of office. Rescheduled for 07/12/22 at 9:00 am with Jaclyn Shaggy, NP.

## 2022-07-10 ENCOUNTER — Ambulatory Visit: Payer: Medicare Other | Admitting: Gastroenterology

## 2022-07-11 ENCOUNTER — Other Ambulatory Visit: Payer: Self-pay

## 2022-07-11 NOTE — Progress Notes (Unsigned)
07/11/2022 Tammy Ball [id number] 1941-08-13   CHIEF COMPLAINT: Black stools  HISTORY OF PRESENT ILLNESS:  Tammy Ball is a 81 year old female with a past medical history of anxiety, depression, hypertension, hyperlipidemia, coronary artery disease s/p NSTEMI 05/2022 on ASA (Plavix discontinued 07/05/2022 secondary to black stools/+ FOBT), non-small cell lung cancer, COPD on home oxygen 2L, renal cel carcinoma s/p left nephrectomy 2012 and a hyperplastic sigmoid colon polyp. She presents today as referred by Dr. [name] for further evaluation regarding GI bleeding with reported black stools and a positive FOBT.   She was admitted to the hospital 06/09/2022 with epigastric pain, substernal pain, nausea and vomiting. WBC 7.2. Hg 13.2. Cr 1.45. Lipase 30. Normal LFTs. CTAP showed an edematous gallbladder. RUQ sono without evidence of a acute cholecystitis. EKG without ischemic changes. Troponin levels 618 -> 694. CTA showed CAD, negative for PE.  She was diagnosed with NSTEMI, placed on aspirin and a heparin drip for 48 hours. Her clinical status stabilized and she was discharged home on Plavix and aspirin 06/11/2022. On ASA x 10 years.   She was seen by her PCP and cardiologist post hospital discharge and she noted passing black stools a few times before and after her hospital admission. No bright red rectal bleeding. Her Hg level remained stable at 13.1 on 06/19/2022. Plavix was stopped by her cardiologist on 07/05/2022. She remains on ASA 81mg  daily. No other NSAID use. She was started on Pantoprazole 40mg  QD on 06/19/2022. She passed a solid black stool yesterday, today passed a normal brown stool. No dysphagia. She has mild nausea and mild heartburn for which she takes TUMS as needed with relief. She developed a cough with mid chest and epigastric pain and vomited up partially digested food and clear phlegm 06/08/2022 without recurrence. No coffee ground or hematemesis. No Pepto bismol or iron  use. She reported undergoing an EGD in 1997 which showed "erosions in my esophagus". No history of a GI bleed. She reported having "colitis" when she was 89 or 81 years old. No lower abdominal pain. No further upper abdominal pain. No CP. She underwent a repeat RUQ sonogram which showed a normal gallbladder without cholecystic fluid. She has mild SOB on continuous oxygen 2L [place]. Appetite is ok. She underwent a colonoscopy by Dr. Collene Mares in 2013 which identified one hyperplastic polyp.      Latest Ref Rng & Units 06/19/2022    2:11 PM 06/11/2022    4:30 AM 06/10/2022    2:43 AM  CBC  WBC 4.0 - 10.5 K/uL 6.4  5.4  6.0   Hemoglobin 12.0 - 15.0 g/dL 13.1  11.5  11.9   Hematocrit 36.0 - 46.0 % 40.8  37.1  37.0   Platelets 150.0 - 400.0 K/uL 279.0  231  223        Latest Ref Rng & Units 06/19/2022    2:11 PM 06/11/2022    4:30 AM 06/10/2022    2:43 AM  CMP  Glucose 70 - 99 mg/dL 106  97  96   BUN 6 - 23 mg/dL 24  24  31    Creatinine 0.40 - 1.20 mg/dL 1.06  1.00  1.44   Sodium 135 - 145 mEq/L 138  135  137   Potassium 3.5 - 5.1 mEq/L 5.1  5.0  5.0   Chloride 96 - 112 mEq/L 98  101  101   CO2 19 - 32 mEq/L 34  29  29  Calcium 8.4 - 10.5 mg/dL 10.2  8.7  9.4   Total Protein 6.0 - 8.3 g/dL 7.1     Total Bilirubin 0.2 - 1.2 mg/dL 0.7     Alkaline Phos 39 - 117 U/L 58     AST 0 - 37 U/L 20     ALT 0 - 35 U/L 19       PAST IMAGE STUDIES:  CTAP with contrast 06/09/2022: Lower chest: Atherosclerotic calcifications in the descending thoracic aorta. Chronic nodular area of architectural distortion in the base of the left lower lobe, stable compared to the prior examination, most compatible with an area of chronic post infectious or inflammatory scarring.   Hepatobiliary: No suspicious cystic or solid hepatic lesions. No intra or extrahepatic biliary ductal dilatation. Mild periportal edema. No calcified gallstones are noted within the gallbladder. Gallbladder wall does appear edematous. No  pericholecystic inflammatory changes.   Pancreas: No pancreatic mass. No pancreatic ductal dilatation. No pancreatic or peripancreatic fluid collections or inflammatory changes.   Spleen: Unremarkable.   Adrenals/Urinary Tract: Left kidney is not visualized, either congenitally or surgically absent. Right kidney and bilateral adrenal glands are normal in appearance. No hydroureteronephrosis. Urinary bladder is unremarkable in appearance.   Stomach/Bowel: The appearance of the stomach is normal. There is no pathologic dilatation of small bowel or colon. Normal appendix.   Vascular/Lymphatic: Extensive atherosclerosis in the abdominal aorta and pelvic vasculature, without evidence of aneurysm or dissection in the abdominal or pelvic vessels. No lymphadenopathy noted in the abdomen or pelvis.   Reproductive: Uterus and ovaries are atrophic.   Other: Small volume of ascites. No pneumoperitoneum.   Musculoskeletal: There are no aggressive appearing lytic or blastic lesions noted in the visualized portions of the skeleton.   IMPRESSION: 1. Periportal edema. Gallbladder wall is also edematous, without overt imaging findings to suggest an acute cholecystitis (and no definite calcified gallstones). Findings suggest intrinsic liver disease, and correlation with liver function tests is recommended. 2. Small volume of ascites. 3. Aortic atherosclerosis. 4. Additional incidental findings, as above.  CTA 06/09/2022: 1. No pulmonary emboli.  No acute abnormality. 2. Emphysema. 3. Atherosclerotic changes in the nonaneurysmal aorta. Coronary artery disease in the left coronary arteries. 4. The main pulmonary artery measures 3.5 cm which is mildly enlarged raising the possibility of pulmonary arterial hypertension. 5. Right upper lobectomy with post radiation change on the right as Above  RUQ sono 06/09/2022: 1. Small amount of nonspecific pericholecystic fluid. No evidence  of cholelithiasis or other signs of acute cholecystitis. 2. No hepatic abnormalities identified.  No biliary dilatation.  RUQ sono 06/27/2022: 1. The gallbladder is now normal in appearance. No pericholecystic fluid identified. 2. The common bile duct is borderline measuring 6.1 mm in caliber. 6 mm is typically considered the upper limits of normal. Recommend correlation with LFTs.  ECHO 06/10/2022: Left ventricular ejection fraction, by estimation, is 60 to 65%. The left ventricle has normal function. The left ventricle has no regional wall motion abnormalities. Left ventricular diastolic parameters are consistent with Grade I diastolic dysfunction (impaired relaxation). 1. Right ventricular systolic function is normal. The right ventricular size is normal. Tricuspid regurgitation signal is inadequate for assessing PA pressure. 2. 3. A small pericardial effusion is present. 4. The mitral valve is grossly normal. No evidence of mitral valve regurgitation. 5. The aortic valve was not well visualized. Aortic valve regurgitation is not visualized. 6. There is Moderate (Grade III) plaque. The inferior vena cava is dilated in size with >50%  respiratory variability, suggesting right atrial pressure of 8 mmHg.  PAST GI PROCEDURES: Colonoscopy 05/09/2012 by Dr. Collene Mares: 1 sessile hyperplastic polyp at 20 cm removed by hot snare x1, otherwise normal colonoscopy to the cecum   Past Medical History:  Diagnosis Date   Anemia    was while doing chemo   Anxiety    Asthma    Chemotherapy-induced neuropathy ([place]) 11/01/2015   Claustrophobia    COPD (chronic obstructive pulmonary disease) ([place])    Depression    Encounter for antineoplastic chemotherapy 02/09/2016   Glaucoma    Headache    prior to menopause   Heart murmur    History of echocardiogram    Echo 2/17: EF 52-77%, grade 1 diastolic dysfunction, mild MR, trivial pericardial effusion   History of nuclear stress test    Myoview  2/17: no ischemia or scar, EF 79%; low risk   History of radiation therapy 10/30/16-11/06/16   Hyperlipidemia    Hypertension    Insomnia 05/16/2016   Non-small cell carcinoma of right lung, stage 2 ([place]) 10/03/2015   Radiation 02/29/16-04/10/16   50.4 Gy to right central chest   Renal cell carcinoma ([place])    L nephrectomy  in 2012   Past Surgical History:  Procedure Laterality Date   BACK SURGERY     cervical 1991   EYE SURGERY     IR GENERIC HISTORICAL  01/16/2017   IR RADIOLOGIST EVAL & MGMT 01/16/2017 [facility]   IR GENERIC HISTORICAL  01/31/2017   IR FLUORO GUIDED NEEDLE PLC ASPIRATION/INJECTION LOC 01/31/2017 Luanne Bras, MD [facility]   IR GENERIC HISTORICAL  01/31/2017   IR VERTEBROPLASTY CERV/THOR BX INC UNI/BIL INC/INJECT/IMAGING 01/31/2017 Luanne Bras, MD [facility]   IR GENERIC HISTORICAL  01/31/2017   IR VERTEBROPLASTY EA ADDL (T&LS) BX INC UNI/BIL INC INJECT/IMAGING 01/31/2017 Luanne Bras, MD [facility]   kidney cancer     NEPHRECTOMY     SPINE SURGERY     TUBAL LIGATION     VIDEO ASSISTED THORACOSCOPY (VATS)/WEDGE RESECTION Right 01/18/2016   Procedure: VIDEO ASSISTED THORACOSCOPY (VATS)/LUNG RESECTION, THOROCOTOMY, RIGHT UPPER LOBECTOMY, LYMPH NODE DISSECTION, PLACEMENT OF ON Q;  Surgeon: [name], MD;  Location: [facility];  Service: Thoracic;  Laterality: Right;   VIDEO BRONCHOSCOPY Bilateral 09/20/2015   Procedure: VIDEO BRONCHOSCOPY WITHOUT FLUORO;  Surgeon: Rigoberto Noel, MD;  Location: [facility];  Service: Cardiopulmonary;  Laterality: Bilateral;   VIDEO BRONCHOSCOPY N/A 01/18/2016   Procedure: VIDEO BRONCHOSCOPY;  Surgeon: [name], MD;  Location: [facility] OR;  Service: Thoracic;  Laterality: N/A;   Social History: She is divorced. She has 2 sons and 2 daughters. She quit smoking cigarettes 11 years ago. No alcohol use. No drug use.   Family History: Mother with CAD, MI age 34 s/p triple bypass surgery. Daughter had MI age 75. Sister  with breast cancer. No known family history of esophageal, gastric or colon cancer.   Allergies  Allergen Reactions   Bee Venom Anaphylaxis, Shortness Of Breath, Swelling and Other (See Comments)    Swelling at site (reaction to bees and wasps)   Amlodipine Swelling and Other (See Comments)    Swelling of the ankles and hands    Levofloxacin Other (See Comments)    Joint pain    Alendronate Other (See Comments)    Joint pains / hypercalcemia    Hctz [Hydrochlorothiazide] Palpitations and Other (See Comments)    Sweating       Outpatient Encounter Medications  as of 07/12/2022  Medication Sig   acetaminophen (TYLENOL) 500 MG tablet Take 500 mg by mouth every 6 (six) hours as needed for headache (pain). Reported on 05/24/2016   albuterol (PROVENTIL) (2.5 MG/3ML) 0.083% nebulizer solution Take 3 mLs (2.5 mg total) by nebulization every 6 (six) hours as needed for wheezing or shortness of breath.   albuterol (VENTOLIN HFA) 108 (90 Base) MCG/ACT inhaler Inhale 1-2 puffs into the lungs every 6 (six) hours as needed for wheezing or shortness of breath.   Ascorbic Acid (VITAMIN C) 1000 MG tablet Take 1,000 mg by mouth every morning.   aspirin EC 81 MG tablet Take 81 mg by mouth every morning.   atorvastatin (LIPITOR) 40 MG tablet TAKE 1 TABLET BY MOUTH EVERY DAY (Patient taking differently: Take 40 mg by mouth every morning.)   Calcium Carbonate Antacid (TUMS PO) Take 1 tablet by mouth every evening.   Carboxymethylcellulose Sodium (ARTIFICIAL TEARS OP) Place 1 drop into both eyes daily as needed (dry eyes).   carvedilol (COREG) 25 MG tablet Take 1 tablet (25 mg total) by mouth 2 (two) times daily.   Cholecalciferol (VITAMIN D3 PO) Take 1 tablet by mouth every morning.   diclofenac Sodium (VOLTAREN) 1 % GEL Apply 1 Application topically at bedtime as needed (pain).   Fluticasone-Umeclidin-Vilant (TRELEGY ELLIPTA) 100-62.5-25 MCG/ACT AEPB Inhale 1 puff into the lungs daily.   Guaifenesin (MUCINEX  MAXIMUM STRENGTH) 1200 MG TB12 Take 1,200 mg by mouth every morning.   losartan (COZAAR) 100 MG tablet TAKE 1 TABLET BY MOUTH EVERY DAY (Patient taking differently: Take 50 mg by mouth 2 (two) times daily.)   Multiple Vitamins-Minerals (ZINC PO) Take 1 tablet by mouth every morning.   Omega-3 Fatty Acids (FISH OIL) 1200 MG CAPS Take 1,200 mg by mouth every morning.   OVER THE COUNTER MEDICATION Apply 1 Application topically See admin instructions. Original vicks - apply topically to knees daily prn for pain   OXYGEN Inhale 2 L into the lungs as needed (to keep SATS at or above 90).   pantoprazole (PROTONIX) 40 MG tablet Take 1 tablet (40 mg total) by mouth daily.   sodium chloride (OCEAN) 0.65 % SOLN nasal spray Place 1 spray into both nostrils daily as needed for congestion.   traMADol (ULTRAM) 50 MG tablet Take 1 tablet (50 mg total) by mouth every 6 (six) hours as needed (BACK PAIN).   No facility-administered encounter medications on file as of 07/12/2022.    REVIEW OF SYSTEMS:  Gen: + Fatigue. Denies fever, sweats or chills. No weight loss.  CV: Denies chest pain, palpitations or edema. Resp: + SOB, no hemoptysis.  GI: See HPI.   GU : + Urine leakage. MS: + Arthritis. Derm: Denies rash, itchiness, skin lesions or unhealing ulcers. Psych: + Anxiety and depression.  Heme: Denies bruising, easy bleeding. Neuro:  Denies headaches, dizziness or paresthesias. Endo:  Denies any problems with DM, thyroid or adrenal function.  PHYSICAL EXAM: BP 112/64   Pulse 76   Ht 5' 3.5" (1.613 m)   Wt 172 lb (78 kg)   BMI 29.99 kg/m   Wt Readings from Last 3 Encounters:  07/12/22 172 lb (78 kg)  06/28/22 175 lb 12.8 oz (79.7 kg)  06/25/22 172 lb 12.8 oz (78.4 kg)    General: 81 year old female in NAD, fatigued appearing, slow gait.  Head: Normocephalic and atraumatic. Eyes:  Sclerae non-icteric, conjunctive pink. Ears: Normal auditory acuity. Mouth: Dentition intact. No ulcers or lesions.  Neck: Supple, no lymphadenopathy or thyromegaly.  Lungs: Clear bilaterally to auscultation without wheezes, crackles or rhonchi. Decreased breath sounds throughout. On portable oxygen 2L [place]. Heart: Regular rate and rhythm. No murmur, rub or gallop appreciated.  Abdomen: Soft, nontender, non distended. No masses. No hepatosplenomegaly. Normoactive bowel sounds x 4 quadrants.  Rectal: Deferred.  Musculoskeletal: Symmetrical with no gross deformities. Skin: Warm and dry. No rash or lesions on visible extremities. Extremities: No edema. Neurological: Alert oriented x 4, no focal deficits.  Psychological:  Alert and cooperative. Normal mood and affect.  ASSESSMENT AND PLAN:  25) 81 year old female with epigastric pain, nausea, vomiting x 1, intermittent black stools and + FOBT. Patient reported passing several solid black stools prior to starting Plavix s/p NSTEMI. No further epigastric pain or vomiting. On Pantoprazole 40mg  QD since 06/19/2022. She passed a black stool yesterday, today stool was brown. No bright red rectal bleeding. Hg 13.1 on 06/19/2022.  -CBC, IBC + ferritin panel and BMP -Continue Pantoprazole 34m QD -Cardiac clearance required prior to pursuing EGD +/- colonoscopy at [facility] [place] in setting of recent NSTEMI. If her Hg level remains stable and if no further black stools on ASA and Protonix, defer any endoscopic evaluation for a few months post NSTEMI. However, if her Hg level drops or if she has further black stools, an urgent EGD would be warranted. She is at high risk for any endoscopic procedures secondary to her age and multiple comorbidiites including heart disease and pulmonary disease on home oxygen. To discuss case further with Dr. Bryan Lemma.  -Patient to contact our office if black stool recurs, to go to the ED if she passes frequent loose  black tarry stools or if she develops CP or worsening SOB  2) NSTEMI 06/09/2022. Plavix discontinued per cardiology on 07/05/2022.  She remains on ASA 81mg  QD. No CP.   3) History of a hyperplastic sigmoid polyp per colonoscopy 04/2012 -See plan in # 1. I would not pursue a colonoscopy unless she has significant anemia or rectal bleeding.  -Await further recommendations per Dr. Bryan Lemma  4) COPD on home oxygen   5) History of non-small cell right lung carcinoma   6) History of renal cell carcinoma s/p left nephrectomy   CC:  Horald Pollen, *

## 2022-07-12 ENCOUNTER — Other Ambulatory Visit (INDEPENDENT_AMBULATORY_CARE_PROVIDER_SITE_OTHER): Payer: Medicare Other

## 2022-07-12 ENCOUNTER — Encounter: Payer: Self-pay | Admitting: Nurse Practitioner

## 2022-07-12 ENCOUNTER — Ambulatory Visit (INDEPENDENT_AMBULATORY_CARE_PROVIDER_SITE_OTHER): Payer: Medicare Other | Admitting: Nurse Practitioner

## 2022-07-12 ENCOUNTER — Telehealth: Payer: Self-pay

## 2022-07-12 VITALS — BP 112/64 | HR 76 | Ht 63.5 in | Wt 172.0 lb

## 2022-07-12 DIAGNOSIS — R195 Other fecal abnormalities: Secondary | ICD-10-CM

## 2022-07-12 DIAGNOSIS — R1013 Epigastric pain: Secondary | ICD-10-CM

## 2022-07-12 DIAGNOSIS — D649 Anemia, unspecified: Secondary | ICD-10-CM

## 2022-07-12 DIAGNOSIS — K921 Melena: Secondary | ICD-10-CM

## 2022-07-12 LAB — BASIC METABOLIC PANEL
BUN: 21 mg/dL (ref 6–23)
CO2: 32 mEq/L (ref 19–32)
Calcium: 9.9 mg/dL (ref 8.4–10.5)
Chloride: 100 mEq/L (ref 96–112)
Creatinine, Ser: 1.22 mg/dL — ABNORMAL HIGH (ref 0.40–1.20)
GFR: 42.41 mL/min — ABNORMAL LOW (ref >60.00)
Glucose, Bld: 116 mg/dL — ABNORMAL HIGH (ref 70–99)
Potassium: 4.6 mEq/L (ref 3.5–5.1)
Sodium: 139 mEq/L (ref 135–145)

## 2022-07-12 LAB — CBC
HCT: 36.3 % (ref 36.0–46.0)
Hemoglobin: 11.9 g/dL — ABNORMAL LOW (ref 12.0–15.0)
MCHC: 32.7 g/dL (ref 30.0–36.0)
MCV: 91.1 fl (ref 78.0–100.0)
Platelets: 214 10*3/uL (ref 150.0–400.0)
RBC: 3.98 Mil/uL (ref 3.87–5.11)
RDW: 18.4 % — ABNORMAL HIGH (ref 11.5–15.5)
WBC: 6.3 10*3/uL (ref 4.0–10.5)

## 2022-07-12 LAB — IBC + FERRITIN
Ferritin: 19 ng/mL (ref 10.0–291.0)
Iron: 75 ug/dL (ref 42–145)
Saturation Ratios: 17.4 % — ABNORMAL LOW (ref 20.0–50.0)
TIBC: 429.8 ug/dL (ref 250.0–450.0)
Transferrin: 307 mg/dL (ref 212.0–360.0)

## 2022-07-12 NOTE — Telephone Encounter (Signed)
[facility] Pre-operative Risk Assessment     Request for surgical clearance:     Endoscopy Procedure  What type of surgery is being performed?     EGD/Colon  When is this surgery scheduled?     TBD  What type of clearance is required ?  Medical  Are there any medications that need to be held prior to surgery and how long?  We are looking for a medical clearance to see if this patient is cleared to have EGD/Colon at [facility] name and name of physician performing surgery?      [facility]  What is your office phone and fax number?      Phone- [phone]  [phone]  Anesthesia type (None, local, MAC, general) ?       MAC

## 2022-07-12 NOTE — Patient Instructions (Addendum)
_______________________________________________________  If you are age 79 or older, your body mass index should be between 23-30. Your Body mass index is 29.99 kg/m. If this is out of the aforementioned range listed, please consider follow up with your Primary Care Provider.  If you are age 76 or younger, your body mass index should be between 19-25. Your Body mass index is 29.99 kg/m. If this is out of the aformentioned range listed, please consider follow up with your Primary Care Provider.   ________________________________________________________  The [facility] providers would like to encourage you to use [facility] to communicate with providers for non-urgent requests or questions.  Due to long hold times on the telephone, sending your provider a message by [facility] may be a faster and more efficient way to get a response.  Please allow 48 business hours for a response.  Please remember that this is for non-urgent requests.  _______________________________________________________  Your provider has requested that you go to the basement level for lab work before leaving today. Press "B" on the elevator. The lab is located at the first door on the left as you exit the elevator.  Call our office if you have any black stools to reappear  Continue Protonix daily.  It was a pleasure to see you today!  Thank you for trusting me with your gastrointestinal care!

## 2022-07-13 NOTE — Telephone Encounter (Signed)
Patient was recently admitted with mild NSTEMI, this was medically managed.  She was initially discharged on aspirin and Plavix, Plavix was held due to lower GI bleed.  Preop APP to contact the patient on Tuesday, if she has not had any recent chest pain, will need to discuss with Dr. Harl Bowie before clearing the patient for this low risk procedure.

## 2022-07-17 ENCOUNTER — Other Ambulatory Visit: Payer: Self-pay | Admitting: Emergency Medicine

## 2022-07-17 NOTE — Progress Notes (Signed)
Agree with the assessment and plan as outlined by Carl Best, NP.   I agree with plan to check CBC, iron panel, and repeat BMP, along with observation for recurrence of bleeding.  Agree with plan.  Continue PPI, consider increasing twice daily dosing for the next 4 weeks or so in the event that this was PUD/gastritis.  Given age, comorbidities, and recent NSTEMI, agree with resuming upper endoscopy (+/-colonoscopy) for any evidence of overt bleeding, anemia, etc. that would require urgent/emergent endoscopy.  Otherwise, observation, and can consider endoscopic evaluation down the road as appropriate and pending overall clinical stability.  Given significant comorbidities, agree that all procedures will be done in the hospital setting due to elevated periprocedural risks.   Gerrit Heck, DO, [facility]

## 2022-07-18 NOTE — Telephone Encounter (Signed)
   Primary Cardiologist: Janina Mayo, MD  Chart reviewed as part of pre-operative protocol coverage. Given past medical history and time since last visit, based on ACC/AHA guidelines, Tammy Ball would be at acceptable risk for the planned procedure without further cardiovascular testing.   She walks 10 minutes in her house twice daily with oxygen and also completes moderate household activities to achieve > 4 METS without chest pain.   Patient was advised that if she develops new symptoms prior to surgery to contact our office to arrange a follow-up appointment.  She verbalized understanding. She reports it is her understanding that the procedure is on hold at this time.   I will route this recommendation to the requesting party via Epic fax function and remove from pre-op pool.  Please call with questions.  Emmaline Life, NP-C     07/18/2022, 10:53 AM [address], [address] Office [phone] Fax [phone]

## 2022-07-23 ENCOUNTER — Other Ambulatory Visit: Payer: Self-pay

## 2022-07-23 DIAGNOSIS — D649 Anemia, unspecified: Secondary | ICD-10-CM

## 2022-07-23 DIAGNOSIS — R1013 Epigastric pain: Secondary | ICD-10-CM

## 2022-07-23 MED ORDER — PANTOPRAZOLE SODIUM 40 MG PO TBEC: 40.0000 mg | DELAYED_RELEASE_TABLET | Freq: Two times a day (BID) | ORAL | 0 refills | Status: DC

## 2022-07-25 ENCOUNTER — Telehealth: Payer: Medicare Other | Admitting: Physician Assistant

## 2022-07-25 ENCOUNTER — Encounter: Payer: Self-pay | Admitting: Physician Assistant

## 2022-07-25 DIAGNOSIS — U071 COVID-19: Secondary | ICD-10-CM

## 2022-07-25 MED ORDER — MOLNUPIRAVIR EUA 200MG CAPSULE: 4.0000 | ORAL_CAPSULE | Freq: Two times a day (BID) | ORAL | 0 refills | Status: AC

## 2022-07-25 NOTE — Patient Instructions (Signed)
[name], thank you for joining Mar Daring, PA-C for today's virtual visit.  While this provider is not your primary care provider (PCP), if your PCP is located in our provider database this encounter information will be shared with them immediately following your visit.  Consent: (Patient) Tammy Ball provided verbal consent for this virtual visit at the beginning of the encounter.  Current Medications:  Current Outpatient Medications:    molnupiravir EUA (LAGEVRIO) 200 mg CAPS capsule, Take 4 capsules (800 mg total) by mouth 2 (two) times daily for 5 days., Disp: 40 capsule, Rfl: 0   acetaminophen (TYLENOL) 500 MG tablet, Take 500 mg by mouth every 6 (six) hours as needed for headache (pain). Reported on 05/24/2016, Disp: , Rfl:    albuterol (PROVENTIL) (2.5 MG/3ML) 0.083% nebulizer solution, Take 3 mLs (2.5 mg total) by nebulization every 6 (six) hours as needed for wheezing or shortness of breath., Disp: 75 mL, Rfl: 6   albuterol (VENTOLIN HFA) 108 (90 Base) MCG/ACT inhaler, Inhale 1-2 puffs into the lungs every 6 (six) hours as needed for wheezing or shortness of breath., Disp: 1 each, Rfl: 6   Ascorbic Acid (VITAMIN C) 1000 MG tablet, Take 1,000 mg by mouth every morning., Disp: , Rfl:    aspirin EC 81 MG tablet, Take 81 mg by mouth every morning., Disp: , Rfl:    atorvastatin (LIPITOR) 40 MG tablet, TAKE 1 TABLET BY MOUTH EVERY DAY (Patient taking differently: Take 40 mg by mouth every morning.), Disp: 90 tablet, Rfl: 3   Calcium Carbonate Antacid (TUMS PO), Take 1 tablet by mouth every evening., Disp: , Rfl:    Carboxymethylcellulose Sodium (ARTIFICIAL TEARS OP), Place 1 drop into both eyes daily as needed (dry eyes)., Disp: , Rfl:    carvedilol (COREG) 25 MG tablet, Take 1 tablet (25 mg total) by mouth 2 (two) times daily., Disp: 60 tablet, Rfl: 3   Cholecalciferol (VITAMIN D3 PO), Take 1 tablet by mouth every morning., Disp: , Rfl:    diclofenac Sodium (VOLTAREN) 1 %  GEL, Apply 1 Application topically at bedtime as needed (pain)., Disp: , Rfl:    Guaifenesin (MUCINEX MAXIMUM STRENGTH) 1200 MG TB12, Take 1,200 mg by mouth every morning., Disp: , Rfl:    losartan (COZAAR) 100 MG tablet, TAKE 1 TABLET BY MOUTH EVERY DAY (Patient taking differently: Take 50 mg by mouth 2 (two) times daily.), Disp: 90 tablet, Rfl: 1   Multiple Vitamins-Minerals (ZINC PO), Take 1 tablet by mouth every morning. (Patient not taking: Reported on 07/12/2022), Disp: , Rfl:    Omega-3 Fatty Acids (FISH OIL) 1200 MG CAPS, Take 1,200 mg by mouth every morning., Disp: , Rfl:    OVER THE COUNTER MEDICATION, Apply 1 Application topically See admin instructions. Original vicks - apply topically to knees daily prn for pain, Disp: , Rfl:    OXYGEN, Inhale 2 L into the lungs as needed (to keep SATS at or above 90)., Disp: , Rfl:    pantoprazole (PROTONIX) 40 MG tablet, Take 1 tablet (40 mg total) by mouth daily., Disp: 30 tablet, Rfl: 3   pantoprazole (PROTONIX) 40 MG tablet, Take 1 tablet (40 mg total) by mouth 2 (two) times daily., Disp: 60 tablet, Rfl: 0   sodium chloride (OCEAN) 0.65 % SOLN nasal spray, Place 1 spray into both nostrils daily as needed for congestion., Disp: , Rfl:    traMADol (ULTRAM) 50 MG tablet, Take 1 tablet (50 mg total) by mouth every 6 (  six) hours as needed (BACK PAIN)., Disp: 50 tablet, Rfl: 1   TRELEGY ELLIPTA 100-62.5-25 MCG/ACT AEPB, INHALE 1 PUFF BY MOUTH EVERY DAY, Disp: 180 each, Rfl: 3   Medications ordered in this encounter:  Meds ordered this encounter  Medications   molnupiravir EUA (LAGEVRIO) 200 mg CAPS capsule    Sig: Take 4 capsules (800 mg total) by mouth 2 (two) times daily for 5 days.    Dispense:  40 capsule    Refill:  0    Order Specific Question:   Supervising Provider    Answer:   Chase Picket [id number]     *If you need refills on other medications prior to your next appointment, please contact your pharmacy*  Follow-Up: Call back or  seek an in-person evaluation if the symptoms worsen or if the condition fails to improve as anticipated.  Other Instructions  Molnupiravir Capsules What is this medication? MOLNUPIRAVIR (MOL nue PIR a vir) treats mild to moderate COVID-19. It may help people who are at high risk of developing severe illness. This medication works by limiting the spread of the virus in your body. The FDA has allowed the emergency use of this medication. This medicine may be used for other purposes; ask your health care provider or pharmacist if you have questions. COMMON BRAND NAME(S): LAGEVRIO What should I tell my care team before I take this medication? They need to know if you have any of these conditions: Any allergies Any serious illness An unusual or allergic reaction to molnupiravir, other medications, foods, dyes, or preservatives Pregnant or trying to get pregnant Breast-feeding How should I use this medication? Take this medication by mouth with water. Take it as directed on the prescription label at the same time every day. Do not cut, crush, or chew this medication. Swallow the capsules whole. You can take it with or without food. If it upsets your stomach, take it with food. Take all of it unless your care team tells you to stop it early. Keep taking it even if you think you are better. Talk to your care team about the use of this medication in children. Special care may be needed. Overdosage: If you think you have taken too much of this medicine contact a poison control center or emergency room at once. NOTE: This medicine is only for you. Do not share this medicine with others. What if I miss a dose? If you miss a dose, take it as soon as you can unless it is more than 10 hours late. If it is more than 10 hours late, skip the missed dose. Take the next dose at the normal time. Do not take extra or 2 doses at the same time to make up for the missed dose. What may interact with this  medication? Interactions have not been studied. This list may not describe all possible interactions. Give your health care provider a list of all the medicines, herbs, non-prescription drugs, or dietary supplements you use. Also tell them if you smoke, drink alcohol, or use illegal drugs. Some items may interact with your medicine. What should I watch for while using this medication? Your condition will be monitored carefully while you are receiving this medication. Visit your care team for regular checkups. Tell your care team if your symptoms do not start to get better or if they get worse. Do not become pregnant while taking this medication. You may need a pregnancy test before starting this medication. Women must use a  reliable form of birth control while taking this medication and for 4 days after stopping the medication. Women should inform their care team if they wish to become pregnant or think they might be pregnant. Men should not father a child while taking this medication and for 3 months after stopping it. There is potential for serious harm to an unborn child. Talk to your care team for more information. Do not breast-feed an infant while taking this medication and for 4 days after stopping the medication. What side effects may I notice from receiving this medication? Side effects that you should report to your care team as soon as possible: Allergic reactions--skin rash, itching, hives, swelling of the face, lips, tongue, or throat Side effects that usually do not require medical attention (report these to your care team if they continue or are bothersome): Diarrhea Dizziness Nausea This list may not describe all possible side effects. Call your doctor for medical advice about side effects. You may report side effects to FDA at 1-800-FDA-1088. Where should I keep my medication? Keep out of the reach of children and pets. Store at room temperature between 20 and 25 degrees C (68 and 77  degrees F). Get rid of any unused medication after the expiration date. To get rid of medications that are no longer needed or have expired: Take the medication to a medication take-back program. Check with your pharmacy or law enforcement to find a location. If you cannot return the medication, check the label or package insert to see if the medication should be thrown out in the garbage or flushed down the toilet. If you are not sure, ask your care team. If it is safe to put it in the trash, take the medication out of the container. Mix the medication with cat litter, dirt, coffee grounds, or other unwanted substance. Seal the mixture in a bag or container. Put it in the trash. NOTE: This sheet is a summary. It may not cover all possible information. If you have questions about this medicine, talk to your doctor, pharmacist, or health care provider.  2023 Elsevier/Gold Standard (2020-11-07 00:00:00)    If you have been instructed to have an in-person evaluation today at a local Urgent Care facility, please use the link below. It will take you to a list of all of our available [facility], including address, phone number and hours of operation. Please do not delay care.  [facility]  If you or a family member do not have a primary care provider, use the link below to schedule a visit and establish care. When you choose a [facility] primary care physician or advanced practice provider, you gain a long-term partner in health. Find a Primary Care Provider  Learn more about [facility]'s in-office and virtual care options: [facility]

## 2022-07-25 NOTE — Telephone Encounter (Signed)
[place], thank you for the updated. I will need Dr. Vivia Ewing input regarding EGD +/- colonoscopy.  Dr. Bryan Lemma, refer to office visit 07/12/2022. Cardiac clearance received. Do you want patient to schedule EGD +/- colonoscopy at [facility] at this time?

## 2022-07-25 NOTE — Progress Notes (Signed)
   Thank you for the details you included in the comment boxes. Those details are very helpful in determining the best course of treatment for you and help [place] to provide the best care.Because of being Covid positive and are a candidate for anti-viral medications, we recommend that you convert this visit to a video visit in order for the provider to better assess what is going on.  The provider will be able to give you a more accurate diagnosis and treatment plan if we can more freely discuss your symptoms and with the addition of a virtual examination.   If you convert to a video visit, we will bill your insurance (similar to an office visit) and you will not be charged for this e-Visit. You will be able to stay at home and speak with the first available [facility] advanced practice provider. The link to do a video visit is in the drop down Menu tab of your Welcome screen in [place].  I provided 5 minutes of non face-to-face time during this encounter for chart review and documentation.

## 2022-07-25 NOTE — Telephone Encounter (Signed)
If we think she is active bleeding/oozing, than yes, would plan for EGD/Colo at [facility]. Otherwise, I think given her age, co-morbidities, recent NSTEMI, it is reasonable to see if the most recent intervention (high dose PPI) makes a meaningful improvement, and would favor close observation with repeat CBC in 2 weeks or so. Of course if concern for acute bleeding, would set up for expedite EGD +/- Colo at [facility].

## 2022-07-25 NOTE — Progress Notes (Signed)
Virtual Visit Consent   Tammy Ball, you are scheduled for a virtual visit with a [facility] provider today. Just as with appointments in the office, your consent must be obtained to participate. Your consent will be active for this visit and any virtual visit you may have with one of our providers in the next 365 days. If you have a MyChart account, a copy of this consent can be sent to you electronically.  As this is a virtual visit, video technology does not allow for your provider to perform a traditional examination. This may limit your provider's ability to fully assess your condition. If your provider identifies any concerns that need to be evaluated in person or the need to arrange testing (such as labs, EKG, etc.), we will make arrangements to do so. Although advances in technology are sophisticated, we cannot ensure that it will always work on either your end or our end. If the connection with a video visit is poor, the visit may have to be switched to a telephone visit. With either a video or telephone visit, we are not always able to ensure that we have a secure connection.  By engaging in this virtual visit, you consent to the provision of healthcare and authorize for your insurance to be billed (if applicable) for the services provided during this visit. Depending on your insurance coverage, you may receive a charge related to this service.  I need to obtain your verbal consent now. Are you willing to proceed with your visit today? CHAVIE KOLINSKI has provided verbal consent on 07/25/2022 for a virtual visit (video or telephone). Mar Daring, PA-C  Date: 07/25/2022 12:15 PM  Virtual Visit via Video Note   I, Mar Daring, connected with  Tammy Ball  ([id number], 1941-02-15) on 07/25/22 at 12:00 PM EDT by a video-enabled telemedicine application and verified that I am speaking with the correct person using two identifiers.  Location: Patient: Virtual Visit Location  Patient: Home Provider: Virtual Visit Location Provider: Home Office   I discussed the limitations of evaluation and management by telemedicine and the availability of in person appointments. The patient expressed understanding and agreed to proceed.    History of Present Illness: Tammy Ball is a 81 y.o. who identifies as a female who was assigned female at birth, and is being seen today for Covid 70.  HPI: URI  This is a new problem. The current episode started in the past 7 days (Symptoms started Monday night; Tested positive for Covid 19 today). The problem has been gradually worsening. The maximum temperature recorded prior to her arrival was 100.4 - 100.9 F (100.8 highest; 99.7 now). The fever has been present for Less than 1 day. Associated symptoms include congestion, coughing, headaches, rhinorrhea, sinus pain and a sore throat. Pertinent negatives include no diarrhea, ear pain, nausea, plugged ear sensation, vomiting or wheezing. She has tried acetaminophen and increased fluids for the symptoms. The treatment provided no relief.     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 ([place]) 02/21/2021   Recurrent  major depressive disorder, in full remission ([place]) 02/21/2021   Atherosclerosis of aorta ([place]) 02/21/2021   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   Chronic diastolic CHF (congestive heart failure) ([place])    Stage 3a chronic kidney disease ([place]) 11/10/2020   Allergic rhinitis 01/06/2019   Chronic  respiratory failure with hypoxia ([place]) 12/26/2018   Age-related osteoporosis with current pathological fracture 11/21/2018   Renal insufficiency 05/20/2018   History of compression fracture of spine 12/10/2016   Hypercalcemia 06/25/2016   Insomnia 05/16/2016   Pneumonitis, radiation ([place]) 04/11/2016   S/P lobectomy of lung 01/18/2016   Chemotherapy-induced neuropathy ([place]) 11/01/2015   Non-small cell carcinoma of right lung, stage 2 ([place]) 10/03/2015   Hyperlipidemia 08/14/2013   Spondylolisthesis of lumbar region 07/03/2013   Essential hypertension 12/17/2011   COPD GOLD B-C 12/17/2011   Renal cell cancer ([place]) 12/17/2011   Sciatica of left side 12/17/2011   Depression 12/17/2011    Allergies:  Allergies  Allergen Reactions   Bee Venom Anaphylaxis, Shortness Of Breath, Swelling and Other (See Comments)    Swelling at site (reaction to bees and wasps)   Amlodipine Swelling and Other (See Comments)    Swelling of the ankles and hands    Levofloxacin Other (See Comments)    Joint pain    Alendronate Other (See Comments)    Joint pains / hypercalcemia    Hctz [Hydrochlorothiazide] Palpitations and Other (See Comments)    Sweating    Medications:  Current Outpatient Medications:    molnupiravir EUA (LAGEVRIO) 200 mg CAPS capsule, Take 4 capsules (800 mg total) by mouth 2 (two) times daily for 5 days., Disp: 40 capsule, Rfl: 0   acetaminophen (TYLENOL) 500 MG tablet, Take 500 mg by mouth every 6 (six) hours as needed for headache (pain). Reported on 05/24/2016, Disp: , Rfl:    albuterol (PROVENTIL) (2.5 MG/3ML) 0.083% nebulizer solution, Take 3 mLs (2.5 mg total) by nebulization every 6 (six) hours as needed for wheezing or shortness of breath., Disp: 75 mL, Rfl: 6   albuterol (VENTOLIN HFA) 108 (90 Base) MCG/ACT inhaler, Inhale 1-2 puffs into the lungs every 6 (six) hours as needed for wheezing or shortness of breath., Disp: 1 each, Rfl: 6   Ascorbic Acid (VITAMIN C) 1000 MG tablet,  Take 1,000 mg by mouth every morning., Disp: , Rfl:    aspirin EC 81 MG tablet, Take 81 mg by mouth every morning., Disp: , Rfl:    atorvastatin (LIPITOR) 40 MG tablet, TAKE 1 TABLET BY MOUTH EVERY DAY (Patient taking differently: Take 40 mg by mouth every morning.), Disp: 90 tablet, Rfl: 3   Calcium Carbonate Antacid (TUMS PO), Take 1 tablet by mouth every evening., Disp: , Rfl:    Carboxymethylcellulose Sodium (ARTIFICIAL TEARS OP), Place 1 drop into both eyes daily as needed (dry eyes)., Disp: , Rfl:    carvedilol (COREG) 25 MG tablet, Take 1 tablet (25 mg total) by mouth 2 (two) times daily., Disp: 60 tablet, Rfl: 3   Cholecalciferol (VITAMIN D3 PO), Take 1 tablet by mouth every morning., Disp: , Rfl:    diclofenac Sodium (VOLTAREN) 1 % GEL, Apply 1 Application topically at bedtime as needed (pain)., Disp: , Rfl:    Guaifenesin (MUCINEX MAXIMUM STRENGTH) 1200 MG TB12, Take 1,200 mg by mouth every morning., Disp: , Rfl:    losartan (COZAAR) 100 MG tablet, TAKE 1 TABLET BY MOUTH EVERY DAY (Patient taking differently: Take 50 mg by mouth 2 (two) times daily.), Disp: 90 tablet,  Rfl: 1   Multiple Vitamins-Minerals (ZINC PO), Take 1 tablet by mouth every morning. (Patient not taking: Reported on 07/12/2022), Disp: , Rfl:    Omega-3 Fatty Acids (FISH OIL) 1200 MG CAPS, Take 1,200 mg by mouth every morning., Disp: , Rfl:    OVER THE COUNTER MEDICATION, Apply 1 Application topically See admin instructions. Original vicks - apply topically to knees daily prn for pain, Disp: , Rfl:    OXYGEN, Inhale 2 L into the lungs as needed (to keep SATS at or above 90)., Disp: , Rfl:    pantoprazole (PROTONIX) 40 MG tablet, Take 1 tablet (40 mg total) by mouth daily., Disp: 30 tablet, Rfl: 3   pantoprazole (PROTONIX) 40 MG tablet, Take 1 tablet (40 mg total) by mouth 2 (two) times daily., Disp: 60 tablet, Rfl: 0   sodium chloride (OCEAN) 0.65 % SOLN nasal spray, Place 1 spray into both nostrils daily as needed for  congestion., Disp: , Rfl:    traMADol (ULTRAM) 50 MG tablet, Take 1 tablet (50 mg total) by mouth every 6 (six) hours as needed (BACK PAIN)., Disp: 50 tablet, Rfl: 1   TRELEGY ELLIPTA 100-62.5-25 MCG/ACT AEPB, INHALE 1 PUFF BY MOUTH EVERY DAY, Disp: 180 each, Rfl: 3  Observations/Objective: Patient is well-developed, well-nourished in no acute distress.  Resting comfortably at home.  Head is normocephalic, atraumatic.  No labored breathing.  Speech is clear and coherent with logical content.  Patient is alert and oriented at baseline.    Assessment and Plan: 1. COVID-19 - molnupiravir EUA (LAGEVRIO) 200 mg CAPS capsule; Take 4 capsules (800 mg total) by mouth 2 (two) times daily for 5 days.  Dispense: 40 capsule; Refill: 0  - Continue OTC symptomatic management of choice - Will send OTC vitamins and supplement information through AVS - Molnupiravir prescribed - Patient enrolled in MyChart symptom monitoring - Push fluids - Rest as needed - Discussed return precautions and when to seek in-person evaluation, sent via AVS as well   Follow Up Instructions: I discussed the assessment and treatment plan with the patient. The patient was provided an opportunity to ask questions and all were answered. The patient agreed with the plan and demonstrated an understanding of the instructions.  A copy of instructions were sent to the patient via MyChart unless otherwise noted below.    The patient was advised to call back or seek an in-person evaluation if the symptoms worsen or if the condition fails to improve as anticipated.  Time:  I spent 12 minutes with the patient via telehealth technology discussing the above problems/concerns.    Mar Daring, PA-C

## 2022-07-25 NOTE — Telephone Encounter (Signed)
Tammy Ball I want you to review this clearance to see what you would like to do regarding seeing about an EGD/Colon at [facility]

## 2022-07-26 ENCOUNTER — Other Ambulatory Visit: Payer: Self-pay

## 2022-07-26 DIAGNOSIS — K921 Melena: Secondary | ICD-10-CM

## 2022-07-26 NOTE — Telephone Encounter (Signed)
Pt was called and symptom update was received: Pt stated that she has no bleeding since her last office appointment: Pt was made aware of Dr. Bryan Lemma recommendations: Orders for lab placed in Epic: Pt made aware and to come in 2 weeks to have labs drawn. Approximately 08/09/2022. Location to lab given   Pt stated that she received a call the other day in regard to making a 6 week follow up appointment. Chart reviewed. Pt was scheduled for an office visit on 09/06/2022 at 9:30 AM: Pt made aware Pt verbalized understanding with all questions answered.

## 2022-07-26 NOTE — Telephone Encounter (Signed)
Remo Lipps, pls see Dr. Vivia Ewing note below. Pls contact patient and obtain a symptom update.  Please verify if she is demonstrating any evidence of active GI bleeding i.e.: Hematemesis, melena or rectal bleeding.  Please let me know, if she is actively bleeding she will need a EGD and colonoscopy at [facility] ASAP.  She is not exhibiting any symptoms of GI bleeding please send her for repeat CBC in 2 weeks.  Please let me know outcome.  Appreciate your help.

## 2022-08-16 ENCOUNTER — Other Ambulatory Visit: Payer: Self-pay | Admitting: Nurse Practitioner

## 2022-08-16 DIAGNOSIS — R1013 Epigastric pain: Secondary | ICD-10-CM

## 2022-08-16 DIAGNOSIS — D649 Anemia, unspecified: Secondary | ICD-10-CM

## 2022-09-04 ENCOUNTER — Other Ambulatory Visit (INDEPENDENT_AMBULATORY_CARE_PROVIDER_SITE_OTHER): Payer: Medicare Other

## 2022-09-04 DIAGNOSIS — K921 Melena: Secondary | ICD-10-CM

## 2022-09-04 LAB — CBC WITH DIFFERENTIAL/PLATELET
Basophils Absolute: 0.1 10*3/uL (ref 0.0–0.1)
Basophils Relative: 1.3 % (ref 0.0–3.0)
Eosinophils Absolute: 0.2 10*3/uL (ref 0.0–0.7)
Eosinophils Relative: 3.4 % (ref 0.0–5.0)
HCT: 33.3 % — ABNORMAL LOW (ref 36.0–46.0)
Hemoglobin: 11 g/dL — ABNORMAL LOW (ref 12.0–15.0)
Lymphocytes Relative: 25.8 % (ref 12.0–46.0)
Lymphs Abs: 1.6 10*3/uL (ref 0.7–4.0)
MCHC: 33.1 g/dL (ref 30.0–36.0)
MCV: 92.4 fl (ref 78.0–100.0)
Monocytes Absolute: 0.7 10*3/uL (ref 0.1–1.0)
Monocytes Relative: 11.6 % (ref 3.0–12.0)
Neutro Abs: 3.7 10*3/uL (ref 1.4–7.7)
Neutrophils Relative %: 57.9 % (ref 43.0–77.0)
Platelets: 253 10*3/uL (ref 150.0–400.0)
RBC: 3.61 Mil/uL — ABNORMAL LOW (ref 3.87–5.11)
RDW: 18.5 % — ABNORMAL HIGH (ref 11.5–15.5)
WBC: 6.3 10*3/uL (ref 4.0–10.5)

## 2022-09-05 NOTE — Progress Notes (Addendum)
09/05/2022 Tammy Ball [id number] 08-14-1941   Chief Complaint: Follow up anemia   History of Present Illness:  Tammy Ball is a 81 year old female with a past medical history of anxiety, depression, hypertension, hyperlipidemia, coronary artery disease s/p NSTEMI 05/2022 on ASA (Plavix discontinued 07/05/2022 secondary to black stools/+ FOBT), non-small cell lung cancer, COPD on home oxygen 2L, renal cel carcinoma s/p left nephrectomy 2012 and a hyperplastic sigmoid colon polyp.   I initially saw Tammy Ball. Depaul in our GI clinic 07/12/2022.  Please refer to that consult note for extensive history review.  At that time, she was referred to our office for further evaluation regarding black stools with a positive FOBT. She was previously on Plavix and aspirin secondary to being diagnosed with a NSTEMI 06/09/2022.  She developed black stools which occurred a few times before and after her hospital admission 05/2022. Her Hg level dropped from 13.1 -> 11.9. Plavix was discontinued by her cardiologist. ASA continued. Normal iron level of 75. Ferritin 19. Saturation ratios a little low at 17.4.%.  She denied having any further black stools.  Endoscopic evaluation was deferred at that time in setting of recent NSTEMI.  A repeat CBC and office follow up in 6 weeks was recommended.   She presents today to review her repeat lab results. CBC 09/04/2022 showed a Hg level of 11 ( Hg 11.9 on 07/12/2022). HCT 33.3. MCV 92.4.   She reports feeling quite well.  She denies passing any black stools since her last office visit.  No bright red rectal bleeding.  She is passing normal formed brown bowel movement most days.  She remains on aspirin 81 mg daily.  She had 1 episode of heartburn 3 to 4 weeks ago without recurrence.  Remains on pantoprazole 40 mg once daily.  No nausea or vomiting.  No abdominal pain.  She is not taking oral iron.  She tested positive for COVID 07/25/2022 which was treated with Molnupiravir x  5 days. She continues to have a mild cough.  Chronically requires oxygen 2 L nasal cannula.   She reported undergoing an EGD in 1997 which showed "erosions in my esophagus".  She underwent a colonoscopy by Dr. Collene Mares in 2013 which identified one hyperplastic polyp.       Latest Ref Rng & Units 09/04/2022    2:15 PM 07/12/2022    9:44 AM 06/19/2022    2:11 PM  CBC  WBC 4.0 - 10.5 K/uL 6.3  6.3  6.4   Hemoglobin 12.0 - 15.0 g/dL 11.0  11.9  13.1   Hematocrit 36.0 - 46.0 % 33.3  36.3  40.8   Platelets 150.0 - 400.0 K/uL 253.0  214.0  279.0        Latest Ref Rng & Units 07/12/2022    9:44 AM 06/19/2022    2:11 PM 06/11/2022    4:30 AM  CMP  Glucose 70 - 99 mg/dL 116  106  97   BUN 6 - 23 mg/dL 21  24  24    Creatinine 0.40 - 1.20 mg/dL 1.22  1.06  1.00   Sodium 135 - 145 mEq/L 139  138  135   Potassium 3.5 - 5.1 mEq/L 4.6  5.1  5.0   Chloride 96 - 112 mEq/L 100  98  101   CO2 19 - 32 mEq/L 32  34  29   Calcium 8.4 - 10.5 mg/dL 9.9  10.2  8.7   Total Protein  6.0 - 8.3 g/dL  7.1    Total Bilirubin 0.2 - 1.2 mg/dL  0.7    Alkaline Phos 39 - 117 U/L  58    AST 0 - 37 U/L  20    ALT 0 - 35 U/L  19     CTAP with contrast 06/09/2022: Lower chest: Atherosclerotic calcifications in the descending thoracic aorta. Chronic nodular area of architectural distortion in the base of the left lower lobe, stable compared to the prior examination, most compatible with an area of chronic post infectious or inflammatory scarring.   Hepatobiliary: No suspicious cystic or solid hepatic lesions. No intra or extrahepatic biliary ductal dilatation. Mild periportal edema. No calcified gallstones are noted within the gallbladder. Gallbladder wall does appear edematous. No pericholecystic inflammatory changes.   Pancreas: No pancreatic mass. No pancreatic ductal dilatation. No pancreatic or peripancreatic fluid collections or inflammatory changes.   Spleen: Unremarkable.   Adrenals/Urinary Tract: Left  kidney is not visualized, either congenitally or surgically absent. Right kidney and bilateral adrenal glands are normal in appearance. No hydroureteronephrosis. Urinary bladder is unremarkable in appearance.   Stomach/Bowel: The appearance of the stomach is normal. There is no pathologic dilatation of small bowel or colon. Normal appendix.   Vascular/Lymphatic: Extensive atherosclerosis in the abdominal aorta and pelvic vasculature, without evidence of aneurysm or dissection in the abdominal or pelvic vessels. No lymphadenopathy noted in the abdomen or pelvis.   Reproductive: Uterus and ovaries are atrophic.   Other: Small volume of ascites. No pneumoperitoneum.   Musculoskeletal: There are no aggressive appearing lytic or blastic lesions noted in the visualized portions of the skeleton.   IMPRESSION: 1. Periportal edema. Gallbladder wall is also edematous, without overt imaging findings to suggest an acute cholecystitis (and no definite calcified gallstones). Findings suggest intrinsic liver disease, and correlation with liver function tests is recommended. 2. Small volume of ascites. 3. Aortic atherosclerosis. 4. Additional incidental findings, as above.    ECHO 06/10/2022: Left ventricular ejection fraction, by estimation, is 60 to 65%. The left ventricle has normal function. The left ventricle has no regional wall motion abnormalities. Left ventricular diastolic parameters are consistent with Grade I diastolic dysfunction (impaired relaxation). 1. Right ventricular systolic function is normal. The right ventricular size is normal. Tricuspid regurgitation signal is inadequate for assessing PA pressure. 2. 3. A small pericardial effusion is present. 4. The mitral valve is grossly normal. No evidence of mitral valve regurgitation. 5. The aortic valve was not well visualized. Aortic valve regurgitation is not visualized. 6. There is Moderate (Grade III) plaque. The inferior  vena cava is dilated in size with >50% respiratory variability, suggesting right atrial pressure of 8 mmHg.   PAST GI PROCEDURES: Colonoscopy 05/09/2012 by Dr. Collene Mares: 1 sessile hyperplastic polyp at 20 cm removed by hot snare x1, otherwise normal colonoscopy to the cecum  Current Outpatient Medications on File Prior to Visit  Medication Sig Dispense Refill   acetaminophen (TYLENOL) 500 MG tablet Take 500 mg by mouth every 6 (six) hours as needed for headache (pain). Reported on 05/24/2016     albuterol (PROVENTIL) (2.5 MG/3ML) 0.083% nebulizer solution Take 3 mLs (2.5 mg total) by nebulization every 6 (six) hours as needed for wheezing or shortness of breath. 75 mL 6   albuterol (VENTOLIN HFA) 108 (90 Base) MCG/ACT inhaler Inhale 1-2 puffs into the lungs every 6 (six) hours as needed for wheezing or shortness of breath. 1 each 6   Ascorbic Acid (VITAMIN C) 1000 MG tablet  Take 1,000 mg by mouth every morning.     aspirin EC 81 MG tablet Take 81 mg by mouth every morning.     atorvastatin (LIPITOR) 40 MG tablet TAKE 1 TABLET BY MOUTH EVERY DAY (Patient taking differently: Take 40 mg by mouth every morning.) 90 tablet 3   Calcium Carbonate Antacid (TUMS PO) Take 1 tablet by mouth every evening.     Carboxymethylcellulose Sodium (ARTIFICIAL TEARS OP) Place 1 drop into both eyes daily as needed (dry eyes).     carvedilol (COREG) 25 MG tablet Take 1 tablet (25 mg total) by mouth 2 (two) times daily. 60 tablet 3   Cholecalciferol (VITAMIN D3 PO) Take 1 tablet by mouth every morning.     diclofenac Sodium (VOLTAREN) 1 % GEL Apply 1 Application topically at bedtime as needed (pain).     diphenhydrAMINE (BENADRYL) 25 mg capsule Take 25 mg by mouth every 6 (six) hours as needed.     Guaifenesin (MUCINEX MAXIMUM STRENGTH) 1200 MG TB12 Take 1,200 mg by mouth every morning.     losartan (COZAAR) 100 MG tablet TAKE 1 TABLET BY MOUTH EVERY DAY (Patient taking differently: Take 50 mg by mouth 2 (two) times daily.)  90 tablet 1   Multiple Vitamins-Minerals (ZINC PO) Take 1 tablet by mouth every morning.     Omega-3 Fatty Acids (FISH OIL) 1200 MG CAPS Take 1,200 mg by mouth every morning.     OVER THE COUNTER MEDICATION Apply 1 Application topically See admin instructions. Original vicks - apply topically to knees daily prn for pain     OXYGEN Inhale 2 L into the lungs as needed (to keep SATS at or above 90).     pantoprazole (PROTONIX) 40 MG tablet Take 1 tablet (40 mg total) by mouth daily. 90 tablet 1   sodium chloride (OCEAN) 0.65 % SOLN nasal spray Place 1 spray into both nostrils daily as needed for congestion.     traMADol (ULTRAM) 50 MG tablet Take 1 tablet (50 mg total) by mouth every 6 (six) hours as needed (BACK PAIN). 50 tablet 1   TRELEGY ELLIPTA 100-62.5-25 MCG/ACT AEPB INHALE 1 PUFF BY MOUTH EVERY DAY 180 each 3   pantoprazole (PROTONIX) 40 MG tablet Take 1 tablet (40 mg total) by mouth daily. 30 tablet 3   No current facility-administered medications on file prior to visit.   Allergies  Allergen Reactions   Bee Venom Anaphylaxis, Shortness Of Breath, Swelling and Other (See Comments)    Swelling at site (reaction to bees and wasps)   Amlodipine Swelling and Other (See Comments)    Swelling of the ankles and hands    Levofloxacin Other (See Comments)    Joint pain    Alendronate Other (See Comments)    Joint pains / hypercalcemia    Hctz [Hydrochlorothiazide] Palpitations and Other (See Comments)    Sweating    Current Medications, Allergies, Past Medical History, Past Surgical History, Family History and Social History were reviewed in [employer] record.  Review of Systems:   Constitutional: Negative for fever, sweats, chills or weight loss.  Respiratory: See HPI. Cardiovascular: Negative for chest pain, palpitations and leg swelling.  Gastrointestinal: See HPI.  Musculoskeletal: Negative for back pain or muscle aches.  Neurological: Negative for  dizziness, headaches or paresthesias.   Physical Exam: BP (!) 144/86   Pulse 66   Ht 5' 3.5" (1.613 m)   Wt 172 lb (78 kg)   BMI 29.99 kg/m  General: 81 year old female in no acute distress. Head: Normocephalic and atraumatic. Eyes: No scleral icterus. Conjunctiva pink . Ears: Normal auditory acuity. Mouth: Dentition intact. No ulcers or lesions.  Lungs: Scattered coarse inspiratory/expiratory wheezes throughout. Heart: Regular rate and rhythm, no murmur. Abdomen: Soft, nontender and nondistended. No masses or hepatomegaly. Normal bowel sounds x 4 quadrants.  Rectal: Deferred. Musculoskeletal: Symmetrical with no gross deformities. Extremities: No edema. Neurological: Alert oriented x 4. No focal deficits.  Psychological: Alert and cooperative. Normal mood and affect  Assessment and Recommendations:  59) 80 year old female with anemia and melenic stool/+ FOBT 05/2022. Plavix discontinued by cardiology. Remains on ASA 81mg  daily. No further melenic stools. Hg 11.3 -> 11.  Normal iron and ferritin levels 07/12/2022 with low saturation ratio 17.4% on 07/12/2022.  -IBC + ferritin today -EGD +/- colonoscopy at [facility] if the above labs identify iron deficiency. EGD and colonoscopy benefits and risks discussed including risk with sedation, risk of bleeding, perforation and infection. She is at high risk for any endoscopic procedures secondary to her age and multiple comorbidiites including heart disease and pulmonary disease on home oxygen.  -Cardiac clearance previously received 07/18/2022 per Christen Bame cardiology NP  -Patient to contact our office if black stool recurs, to go to the ED if she passes frequent loose  black tarry stools or if she develops CP or worsening SOB -Continue Pantoprazole 40 mg p.o. daily for now   2) NSTEMI 06/09/2022. Plavix discontinued per cardiology on 07/05/2022. She remains on ASA 81mg  QD.    3) History of a hyperplastic sigmoid polyp per colonoscopy  04/2012  4) COPD on home oxygen 2L [place]. Covid 19 infection 07/2022 treated with Molnupiravir x 5 days.    5) History of non-small cell right lung carcinoma    6) History of renal cell carcinoma s/p left nephrectomy  ADDENDUM: Updated cardiac clearance received 09/19/2022 as follow:  1.  Preoperative Cardiovascular Risk Assessment: EGD/colonoscopy, [facility]    Primary Cardiologist: Janina Mayo, MD   Chart reviewed as part of pre-operative protocol coverage. Given past medical history and time since last visit, based on ACC/AHA guidelines, Tammy Ball would be at acceptable risk for the planned procedure without further cardiovascular testing.    Patient was advised that if she develops new symptoms prior to surgery to contact our office to arrange a follow-up appointment.  She verbalized understanding.   Regarding ASA therapy, we recommend continuation of ASA throughout the perioperative period as patient is s/p NSTEMI in 05/2022.

## 2022-09-06 ENCOUNTER — Other Ambulatory Visit (INDEPENDENT_AMBULATORY_CARE_PROVIDER_SITE_OTHER): Payer: Medicare Other

## 2022-09-06 ENCOUNTER — Encounter: Payer: Self-pay | Admitting: Nurse Practitioner

## 2022-09-06 ENCOUNTER — Ambulatory Visit (INDEPENDENT_AMBULATORY_CARE_PROVIDER_SITE_OTHER): Payer: Medicare Other | Admitting: Nurse Practitioner

## 2022-09-06 VITALS — BP 144/86 | HR 66 | Ht 63.5 in | Wt 172.0 lb

## 2022-09-06 DIAGNOSIS — D649 Anemia, unspecified: Secondary | ICD-10-CM

## 2022-09-06 LAB — COMPREHENSIVE METABOLIC PANEL
ALT: 14 U/L (ref 0–35)
AST: 17 U/L (ref 0–37)
Albumin: 4 g/dL (ref 3.5–5.2)
Alkaline Phosphatase: 59 U/L (ref 39–117)
BUN: 19 mg/dL (ref 6–23)
CO2: 35 mEq/L — ABNORMAL HIGH (ref 19–32)
Calcium: 9.5 mg/dL (ref 8.4–10.5)
Chloride: 98 mEq/L (ref 96–112)
Creatinine, Ser: 1.03 mg/dL (ref 0.40–1.20)
GFR: 51.91 mL/min — ABNORMAL LOW (ref >60.00)
Glucose, Bld: 109 mg/dL — ABNORMAL HIGH (ref 70–99)
Potassium: 4.6 mEq/L (ref 3.5–5.1)
Sodium: 137 mEq/L (ref 135–145)
Total Bilirubin: 0.6 mg/dL (ref 0.2–1.2)
Total Protein: 6.6 g/dL (ref 6.0–8.3)

## 2022-09-06 LAB — IBC + FERRITIN
Ferritin: 9 ng/mL — ABNORMAL LOW (ref 10.0–291.0)
Iron: 75 ug/dL (ref 42–145)
Saturation Ratios: 17.4 % — ABNORMAL LOW (ref 20.0–50.0)
TIBC: 431.2 ug/dL (ref 250.0–450.0)
Transferrin: 308 mg/dL (ref 212.0–360.0)

## 2022-09-06 LAB — B12 AND FOLATE PANEL
Folate: 13.3 ng/mL (ref >5.9)
Vitamin B-12: 842 pg/mL (ref 211–911)

## 2022-09-06 NOTE — Patient Instructions (Addendum)
Your provider has requested that you go to the basement level for lab work before leaving today. Press "B" on the elevator. The lab is located at the first door on the left as you exit the elevator.   Further recommendations to be determined after lab results are reviewed.   Due to recent changes in healthcare laws, you may see the results of your imaging and laboratory studies on MyChart before your provider has had a chance to review them.  We understand that in some cases there may be results that are confusing or concerning to you. Not all laboratory results come back in the same time frame and the provider may be waiting for multiple results in order to interpret others.  Please give [place] 48 hours in order for your provider to thoroughly review all the results before contacting the office for clarification of your results.    It was a pleasure to see you today!  Thank you for trusting me with your gastrointestinal care!

## 2022-09-06 NOTE — Progress Notes (Signed)
Agree with the assessment and plan as outlined by Carl Best, NP.   Hemoglobin relatively stable with mild decrement in iron indices.  Per chart review, no further overt bleeding.  Based on recency NSTEMI, I am more inclined to wait to perform EGD/colonoscopy until 6 months after NSTEMI (12/2022).  Reasonable to continue ASA as prescribed.  Reasonable to restart oral iron.  If intolerant to oral iron, can give IV iron with repeat CBC and iron panel in 2-3 months.  Can start looking to coordinate EGD/colonoscopy in 12/2022 with Cardiology clearance to proceed with endoscopic procedures.  No need to hold aspirin in the perioperative setting.  Agree procedures need to be scheduled at [facility] due to bilateral procedure risks   Gerrit Heck, DO, [facility]

## 2022-09-08 DIAGNOSIS — Z23 Encounter for immunization: Secondary | ICD-10-CM | POA: Diagnosis not present

## 2022-09-10 ENCOUNTER — Telehealth: Payer: Self-pay

## 2022-09-10 ENCOUNTER — Other Ambulatory Visit: Payer: Self-pay

## 2022-09-10 DIAGNOSIS — K921 Melena: Secondary | ICD-10-CM

## 2022-09-10 NOTE — Telephone Encounter (Signed)
Pt stated that she has not been taking any iron, Pepto Bismol, or eating Oreo Cookies: Pt stated that her stool was formed: Orders for labs placed in Epic: Pt made aware:Location to lab give  Pt stated that she will come tomorrow and have labs drawn: Pt verbalized understanding with all questions answered.

## 2022-09-10 NOTE — Progress Notes (Signed)
Tammy Ball, pls call patient and let her know Dr. Bryan Lemma did not assess she needed an EGD and colonoscopy at this time as her hemoglobin level is relatively stable.  Please have her start ferrous sulfate 325 mg 1 tab p.o. daily #30 with 2 refills.  Please send her to our lab for CBC, IBC + ferritin level in 2 months.  Please schedule her for follow-up appointment in our office in January 2024 and hold a February spot for an EGD and colonoscopy at [facility] with Dr. Bryan Lemma, coordinate that with his nurse Mickel Baas.  Patient will need an updated cardiac clearance but I will officially request that at the time of her follow-up office visit January 2024. Thank you.

## 2022-09-10 NOTE — Telephone Encounter (Signed)
-----   Message from Noralyn Pick, NP sent at 09/10/2022  8:27 AM EDT -----    ----- Message ----- From: Lavena Bullion, DO Sent: 09/06/2022   4:29 PM EDT To: Noralyn Pick, NP     ----- Message ----- From: Noralyn Pick, NP Sent: 09/06/2022   2:09 PM EDT To: Lavena Bullion, DO  Dr. Bryan Lemma, her labs came back with a ferritin down from 19 -> 9.  Saturation ratio about the same.  Normal iron level.  She had COVID in September, back to baseline respiratory status.  Please let me know if you want the patient to be scheduled for an EGD only or EGD and colonoscopy at [facility]?  Appreciate your input.

## 2022-09-10 NOTE — Telephone Encounter (Signed)
-----   Message from Noralyn Pick, NP sent at 09/10/2022  8:27 AM EDT -----    ----- Message ----- From: Lavena Bullion, DO Sent: 09/06/2022   4:29 PM EDT To: Noralyn Pick, NP     ----- Message ----- From: Noralyn Pick, NP Sent: 09/06/2022   2:09 PM EDT To: Lavena Bullion, DO  Dr. Bryan Lemma, her labs came back with a ferritin down from 19 -> 9.  Saturation ratio about the same.  Normal iron level.  She had COVID in September, back to baseline respiratory status.  Please let me know if you want the patient to be scheduled for an EGD only or EGD and colonoscopy at [facility]?  Appreciate your input.

## 2022-09-10 NOTE — Telephone Encounter (Signed)
Contacted pt and notified her on  note below:   Remo Lipps, pls call patient and let her know Dr. Bryan Lemma did not assess she needed an EGD and colonoscopy at this time as her hemoglobin level is relatively stable.  Please have her start ferrous sulfate 325 mg 1 tab Tammy.o. daily #30 with 2 refills.  Please send her to our lab for CBC, IBC + ferritin level in 2 months.  Please schedule her for follow-up appointment in our office in January 2024 and hold a February spot for an EGD and colonoscopy at [facility] with Dr. Bryan Lemma, coordinate that with his nurse Mickel Baas.  Patient will need an updated cardiac clearance but I will officially request that at the time of her follow-up office visit January 2024. Thank you.  Please see my chart note pt sent in this am below    Tammy Maxin "Sue"  Tammy Ball Clinical Pool (supporting Noralyn Pick, NP) 18 minutes ago (9:55 AM)               I was told to contact you if I started having black stool again. It started Saturday and was black today.  I have No other symptoms.    Please advise if the plan is the same as in previous phone note :

## 2022-09-10 NOTE — Telephone Encounter (Signed)
Spoke with pt over the phone:  Phone note sent to Carl Best NP

## 2022-09-11 ENCOUNTER — Other Ambulatory Visit (INDEPENDENT_AMBULATORY_CARE_PROVIDER_SITE_OTHER): Payer: Medicare Other

## 2022-09-11 DIAGNOSIS — K921 Melena: Secondary | ICD-10-CM | POA: Diagnosis not present

## 2022-09-11 LAB — COMPREHENSIVE METABOLIC PANEL
ALT: 15 U/L (ref 0–35)
AST: 19 U/L (ref 0–37)
Albumin: 4.2 g/dL (ref 3.5–5.2)
Alkaline Phosphatase: 66 U/L (ref 39–117)
BUN: 16 mg/dL (ref 6–23)
CO2: 33 mEq/L — ABNORMAL HIGH (ref 19–32)
Calcium: 9.7 mg/dL (ref 8.4–10.5)
Chloride: 99 mEq/L (ref 96–112)
Creatinine, Ser: 1.01 mg/dL (ref 0.40–1.20)
GFR: 53.14 mL/min — ABNORMAL LOW (ref >60.00)
Glucose, Bld: 110 mg/dL — ABNORMAL HIGH (ref 70–99)
Potassium: 4.5 mEq/L (ref 3.5–5.1)
Sodium: 139 mEq/L (ref 135–145)
Total Bilirubin: 0.6 mg/dL (ref 0.2–1.2)
Total Protein: 6.9 g/dL (ref 6.0–8.3)

## 2022-09-13 ENCOUNTER — Other Ambulatory Visit (INDEPENDENT_AMBULATORY_CARE_PROVIDER_SITE_OTHER): Payer: Medicare Other

## 2022-09-13 DIAGNOSIS — K921 Melena: Secondary | ICD-10-CM | POA: Diagnosis not present

## 2022-09-13 LAB — FECAL OCCULT BLOOD, IMMUNOCHEMICAL: Fecal Occult Bld: POSITIVE — AB

## 2022-09-14 ENCOUNTER — Telehealth: Payer: Self-pay

## 2022-09-14 ENCOUNTER — Other Ambulatory Visit: Payer: Self-pay

## 2022-09-14 DIAGNOSIS — D649 Anemia, unspecified: Secondary | ICD-10-CM

## 2022-09-14 DIAGNOSIS — K921 Melena: Secondary | ICD-10-CM

## 2022-09-14 DIAGNOSIS — R195 Other fecal abnormalities: Secondary | ICD-10-CM

## 2022-09-14 MED ORDER — FERROUS SULFATE 325 (65 FE) MG PO TABS: 325.0000 mg | ORAL_TABLET | Freq: Every day | ORAL | 2 refills | Status: DC

## 2022-09-14 NOTE — Telephone Encounter (Signed)
  Patient Consent for Virtual Visit         Tammy Ball has provided verbal consent on 09/14/2022 for a virtual visit (video or telephone).   CONSENT FOR VIRTUAL VISIT FOR:  Tammy Ball  By participating in this virtual visit I agree to the following:  I hereby voluntarily request, consent and authorize [facility] and its employed or contracted physicians, physician assistants, nurse practitioners or other licensed health care professionals (the Practitioner), to provide me with telemedicine health care services (the "Services") as deemed necessary by the treating Practitioner. I acknowledge and consent to receive the Services by the Practitioner via telemedicine. I understand that the telemedicine visit will involve communicating with the Practitioner through live audiovisual communication technology and the disclosure of certain medical information by electronic transmission. I acknowledge that I have been given the opportunity to request an in-person assessment or other available alternative prior to the telemedicine visit and am voluntarily participating in the telemedicine visit.  I understand that I have the right to withhold or withdraw my consent to the use of telemedicine in the course of my care at any time, without affecting my right to future care or treatment, and that the Practitioner or I may terminate the telemedicine visit at any time. I understand that I have the right to inspect all information obtained and/or recorded in the course of the telemedicine visit and may receive copies of available information for a reasonable fee.  I understand that some of the potential risks of receiving the Services via telemedicine include:  Delay or interruption in medical evaluation due to technological equipment failure or disruption; Information transmitted may not be sufficient (e.g. poor resolution of images) to allow for appropriate medical decision making by the  Practitioner; and/or  In rare instances, security protocols could fail, causing a breach of personal health information.  Furthermore, I acknowledge that it is my responsibility to provide information about my medical history, conditions and care that is complete and accurate to the best of my ability. I acknowledge that Practitioner's advice, recommendations, and/or decision may be based on factors not within their control, such as incomplete or inaccurate data provided by me or distortions of diagnostic images or specimens that may result from electronic transmissions. I understand that the practice of medicine is not an exact science and that Practitioner makes no warranties or guarantees regarding treatment outcomes. I acknowledge that a copy of this consent can be made available to me via my patient portal ([facility]), or I can request a printed copy by calling the office of [facility].    I understand that my insurance will be billed for this visit.   I have read or had this consent read to me. I understand the contents of this consent, which adequately explains the benefits and risks of the Services being provided via telemedicine.  I have been provided ample opportunity to ask questions regarding this consent and the Services and have had my questions answered to my satisfaction. I give my informed consent for the services to be provided through the use of telemedicine in my medical care

## 2022-09-14 NOTE — Telephone Encounter (Signed)
1st attempt to reach pt to schedule tele visit. Lvm

## 2022-09-14 NOTE — Telephone Encounter (Signed)
   Name: Tammy Ball  DOB: 25-Nov-1940  MRN: [id number]  Primary Cardiologist: Janina Mayo, MD   Preoperative team, please contact this patient and set up a phone call appointment for further preoperative risk assessment. Please obtain consent and complete medication review. Thank you for your help.  I confirm that guidance regarding antiplatelet and oral anticoagulation therapy has been completed and, if necessary, noted below.  Regarding ASA therapy, we recommend continuation of ASA throughout the perioperative period as patient is s/p NSTEMI in 05/2022.     Lenna Sciara, NP 09/14/2022, 11:32 AM [place]

## 2022-09-14 NOTE — Telephone Encounter (Signed)
[facility] Pre-operative Risk Assessment     Request for surgical clearance:     Endoscopy Procedure  What type of surgery is being performed?     EGD/Colonoscopy   When is this surgery scheduled?     To be determined   What type of clearance is required ?  Medical  Are there any medications that need to be held prior to surgery and how long? no  Practice name and name of physician performing surgery?      [facility]  What is your office phone and fax number?      Phone- [phone]  [phone]  Anesthesia type (None, local, MAC, general) ?       MAC

## 2022-09-17 ENCOUNTER — Other Ambulatory Visit (INDEPENDENT_AMBULATORY_CARE_PROVIDER_SITE_OTHER): Payer: Medicare Other

## 2022-09-17 DIAGNOSIS — D649 Anemia, unspecified: Secondary | ICD-10-CM | POA: Diagnosis not present

## 2022-09-17 DIAGNOSIS — K921 Melena: Secondary | ICD-10-CM | POA: Diagnosis not present

## 2022-09-17 DIAGNOSIS — R195 Other fecal abnormalities: Secondary | ICD-10-CM

## 2022-09-17 LAB — CBC WITH DIFFERENTIAL/PLATELET
Basophils Absolute: 0.1 10*3/uL (ref 0.0–0.1)
Basophils Relative: 1.8 % (ref 0.0–3.0)
Eosinophils Absolute: 0.2 10*3/uL (ref 0.0–0.7)
Eosinophils Relative: 3.9 % (ref 0.0–5.0)
HCT: 32.8 % — ABNORMAL LOW (ref 36.0–46.0)
Hemoglobin: 10.8 g/dL — ABNORMAL LOW (ref 12.0–15.0)
Lymphocytes Relative: 24.7 % (ref 12.0–46.0)
Lymphs Abs: 1.2 10*3/uL (ref 0.7–4.0)
MCHC: 32.9 g/dL (ref 30.0–36.0)
MCV: 92.8 fl (ref 78.0–100.0)
Monocytes Absolute: 0.5 10*3/uL (ref 0.1–1.0)
Monocytes Relative: 10.3 % (ref 3.0–12.0)
Neutro Abs: 2.9 10*3/uL (ref 1.4–7.7)
Neutrophils Relative %: 59.3 % (ref 43.0–77.0)
Platelets: 262 10*3/uL (ref 150.0–400.0)
RBC: 3.53 Mil/uL — ABNORMAL LOW (ref 3.87–5.11)
RDW: 17.8 % — ABNORMAL HIGH (ref 11.5–15.5)
WBC: 4.9 10*3/uL (ref 4.0–10.5)

## 2022-09-18 ENCOUNTER — Other Ambulatory Visit: Payer: Self-pay

## 2022-09-18 DIAGNOSIS — D649 Anemia, unspecified: Secondary | ICD-10-CM

## 2022-09-18 DIAGNOSIS — K921 Melena: Secondary | ICD-10-CM

## 2022-09-18 NOTE — Telephone Encounter (Signed)
Pt contacted to notify pt of recommendation  to repeat labs in 2 months:  Orders for labs placed in Epic: Pt notified to mark calendar or around Jan 8th: Pt scheduled for an office visit with Dr. Bryan Lemma on 11/23/2021 at 8:40 am   Pt made aware: Pt verbalized understanding with all questions answered.

## 2022-09-19 ENCOUNTER — Ambulatory Visit: Payer: Medicare Other | Attending: Cardiology | Admitting: General Practice

## 2022-09-19 DIAGNOSIS — Z01810 Encounter for preprocedural cardiovascular examination: Secondary | ICD-10-CM | POA: Diagnosis not present

## 2022-09-19 NOTE — Progress Notes (Signed)
Virtual Visit via Telephone Note   Because of Tammy Ball's co-morbid illnesses, she is at least at moderate risk for complications without adequate follow up.  This format is felt to be most appropriate for this patient at this time.  The patient did not have access to video technology/had technical difficulties with video requiring transitioning to audio format only (telephone).  All issues noted in this document were discussed and addressed.  No physical exam could be performed with this format.  Please refer to the patient's chart for her consent to telehealth for [facility].  Evaluation Performed:  Preoperative cardiovascular risk assessment _____________   Date:  09/19/2022   Patient ID:  Tammy Ball, DOB 11-20-1940, MRN [id number] Patient Location:  Home Provider location:   Office  Primary Care Provider:  Horald Pollen, MD Primary Cardiologist:  Janina Mayo, MD  Chief Complaint / Patient Profile   81 y.o. y/o female with a h/o coronary artery disease, anemia, anxiety, COPD who is pending EGD/colonoscopy and presents today for telephonic preoperative cardiovascular risk assessment.  Past Medical History    Past Medical History:  Diagnosis Date   Anemia    was while doing chemo   Anxiety    Asthma    Chemotherapy-induced neuropathy ([place]) 11/01/2015   Claustrophobia    COPD (chronic obstructive pulmonary disease) ([place])    Depression    Encounter for antineoplastic chemotherapy 02/09/2016   Glaucoma    Headache    prior to menopause   Heart murmur    History of echocardiogram    Echo 2/17: EF 34-19%, grade 1 diastolic dysfunction, mild MR, trivial pericardial effusion   History of nuclear stress test    Myoview 2/17: no ischemia or scar, EF 79%; low risk   History of radiation therapy 10/30/16-11/06/16   Hyperlipidemia    Hypertension    Insomnia 05/16/2016   Non-small cell carcinoma of right lung, stage 2 ([place]) 10/03/2015   Radiation  02/29/16-04/10/16   50.4 Gy to right central chest   Renal cell carcinoma ([place])    L nephrectomy  in 2012   Past Surgical History:  Procedure Laterality Date   BACK SURGERY     cervical 1991   EYE SURGERY     IR GENERIC HISTORICAL  01/16/2017   IR RADIOLOGIST EVAL & MGMT 01/16/2017 [facility]   IR GENERIC HISTORICAL  01/31/2017   IR FLUORO GUIDED NEEDLE PLC ASPIRATION/INJECTION LOC 01/31/2017 Luanne Bras, MD [facility]   IR GENERIC HISTORICAL  01/31/2017   IR VERTEBROPLASTY CERV/THOR BX INC UNI/BIL INC/INJECT/IMAGING 01/31/2017 Luanne Bras, MD [facility]   IR GENERIC HISTORICAL  01/31/2017   IR VERTEBROPLASTY EA ADDL (T&LS) BX INC UNI/BIL INC INJECT/IMAGING 01/31/2017 Luanne Bras, MD [facility]   kidney cancer     NEPHRECTOMY     SPINE SURGERY     TUBAL LIGATION     VIDEO ASSISTED THORACOSCOPY (VATS)/WEDGE RESECTION Right 01/18/2016   Procedure: VIDEO ASSISTED THORACOSCOPY (VATS)/LUNG RESECTION, THOROCOTOMY, RIGHT UPPER LOBECTOMY, LYMPH NODE DISSECTION, PLACEMENT OF ON Q;  Surgeon: [name], MD;  Location: [facility];  Service: Thoracic;  Laterality: Right;   VIDEO BRONCHOSCOPY Bilateral 09/20/2015   Procedure: VIDEO BRONCHOSCOPY WITHOUT FLUORO;  Surgeon: Rigoberto Noel, MD;  Location: [facility];  Service: Cardiopulmonary;  Laterality: Bilateral;   VIDEO BRONCHOSCOPY N/A 01/18/2016   Procedure: VIDEO BRONCHOSCOPY;  Surgeon: [name], MD;  Location: [facility];  Service: Thoracic;  Laterality: N/A;  Allergies  Allergies  Allergen Reactions   Bee Venom Anaphylaxis, Shortness Of Breath, Swelling and Other (See Comments)    Swelling at site (reaction to bees and wasps)   Amlodipine Swelling and Other (See Comments)    Swelling of the ankles and hands    Levofloxacin Other (See Comments)    Joint pain    Alendronate Other (See Comments)    Joint pains / hypercalcemia    Hctz [Hydrochlorothiazide] Palpitations and Other (See Comments)    Sweating      History of Present Illness    Tammy Ball is a 81 y.o. female who presents via audio/video conferencing for a telehealth visit today.  Pt was last seen in cardiology clinic on 06/28/2022 by Dr. Phineas Inches.  At that time Tammy Ball was doing well .  The patient is now pending procedure as outlined above. Since her last visit, she continues to be stable from a cardiac standpoint  Today she denies chest pain, shortness of breath, lower extremity edema, fatigue, palpitations, melena, hematuria, hemoptysis, diaphoresis, weakness, presyncope, syncope, orthopnea, and PND.  Home Medications    Prior to Admission medications   Medication Sig Start Date End Date Taking? Authorizing Provider  acetaminophen (TYLENOL) 500 MG tablet Take 500 mg by mouth every 6 (six) hours as needed for headache (pain). Reported on 05/24/2016    [provider]  albuterol (PROVENTIL) (2.5 MG/3ML) 0.083% nebulizer solution Take 3 mLs (2.5 mg total) by nebulization every 6 (six) hours as needed for wheezing or shortness of breath. 01/09/22   Collene Gobble, MD  albuterol (VENTOLIN HFA) 108 (90 Base) MCG/ACT inhaler Inhale 1-2 puffs into the lungs every 6 (six) hours as needed for wheezing or shortness of breath. 10/25/21   Horald Pollen, MD  Ascorbic Acid (VITAMIN C) 1000 MG tablet Take 1,000 mg by mouth every morning.    [provider]  aspirin EC 81 MG tablet Take 81 mg by mouth every morning.    [provider]  atorvastatin (LIPITOR) 40 MG tablet TAKE 1 TABLET BY MOUTH EVERY DAY Patient taking differently: Take 40 mg by mouth every morning. 05/08/22   Horald Pollen, MD  Calcium Carbonate Antacid (TUMS PO) Take 1 tablet by mouth every evening.    [provider]  Carboxymethylcellulose Sodium (ARTIFICIAL TEARS OP) Place 1 drop into both eyes daily as needed (dry eyes).    [provider]  carvedilol (COREG) 25 MG tablet Take 1 tablet (25 mg total) by  mouth 2 (two) times daily. 07/04/22   Janina Mayo, MD  Cholecalciferol (VITAMIN D3 PO) Take 1 tablet by mouth every morning.    [provider]  diclofenac Sodium (VOLTAREN) 1 % GEL Apply 1 Application topically at bedtime as needed (pain).    [provider]  diphenhydrAMINE (BENADRYL) 25 mg capsule Take 25 mg by mouth every 6 (six) hours as needed.    [provider]  ferrous sulfate 325 (65 FE) MG tablet Take 1 tablet (325 mg total) by mouth daily with breakfast. 09/14/22   Cirigliano, Vito V, DO  Guaifenesin (MUCINEX MAXIMUM STRENGTH) 1200 MG TB12 Take 1,200 mg by mouth every morning.    [provider]  losartan (COZAAR) 100 MG tablet TAKE 1 TABLET BY MOUTH EVERY DAY Patient taking differently: Take 50 mg by mouth 2 (two) times daily. 04/26/22   Horald Pollen, MD  Multiple Vitamins-Minerals (ZINC PO) Take 1 tablet by mouth every morning.  [provider]  Omega-3 Fatty Acids (FISH OIL) 1200 MG CAPS Take 1,200 mg by mouth every morning.    [provider]  OVER THE COUNTER MEDICATION Apply 1 Application topically See admin instructions. Original vicks - apply topically to knees daily prn for pain    [provider]  OXYGEN Inhale 2 L into the lungs as needed (to keep SATS at or above 90).    [provider]  pantoprazole (PROTONIX) 40 MG tablet Take 40 mg by mouth 2 (two) times daily.    [provider]  sodium chloride (OCEAN) 0.65 % SOLN nasal spray Place 1 spray into both nostrils daily as needed for congestion.    [provider]  traMADol (ULTRAM) 50 MG tablet Take 1 tablet (50 mg total) by mouth every 6 (six) hours as needed (BACK PAIN). 09/06/21   Horald Pollen, MD  TRELEGY ELLIPTA 100-62.5-25 MCG/ACT AEPB INHALE 1 PUFF BY MOUTH EVERY DAY 07/17/22   Collene Gobble, MD    Physical Exam    Vital Signs:  Tammy Ball does not have vital signs available for review today.  Given  telephonic nature of communication, physical exam is limited. AAOx3. NAD. Normal affect.  Speech and respirations are unlabored.  Accessory Clinical Findings    None  Assessment & Plan    1.  Preoperative Cardiovascular Risk Assessment: EGD/colonoscopy, [facility]    Primary Cardiologist: Janina Mayo, MD  Chart reviewed as part of pre-operative protocol coverage. Given past medical history and time since last visit, based on ACC/AHA guidelines, Tammy Ball would be at acceptable risk for the planned procedure without further cardiovascular testing.   Patient was advised that if she develops new symptoms prior to surgery to contact our office to arrange a follow-up appointment.  She verbalized understanding.  Regarding ASA therapy, we recommend continuation of ASA throughout the perioperative period as patient is s/p NSTEMI in 05/2022.     I will route this recommendation to the requesting party via Epic fax function and remove from pre-op pool.    A copy of this note will be routed to requesting surgeon.  Time:   Today, I have spent 5 minutes with the patient with telehealth technology discussing medical history, symptoms, and management plan.  Prior to her phone evaluation I spent greater than 10 minutes reviewing her past medical history and cardiac medications.   Deberah Pelton, NP  09/19/2022, 7:18 AM

## 2022-09-19 NOTE — Progress Notes (Signed)
CLEARANCE NOTES FAXED TO [facility]

## 2022-09-20 ENCOUNTER — Encounter: Payer: Self-pay | Admitting: Emergency Medicine

## 2022-09-20 ENCOUNTER — Ambulatory Visit (INDEPENDENT_AMBULATORY_CARE_PROVIDER_SITE_OTHER): Payer: Medicare Other | Admitting: Emergency Medicine

## 2022-09-20 VITALS — BP 126/76 | HR 61 | Temp 98.2°F | Ht 63.5 in | Wt 170.2 lb

## 2022-09-20 DIAGNOSIS — F5102 Adjustment insomnia: Secondary | ICD-10-CM | POA: Diagnosis not present

## 2022-09-20 DIAGNOSIS — I10 Essential (primary) hypertension: Secondary | ICD-10-CM

## 2022-09-20 DIAGNOSIS — I700 Atherosclerosis of aorta: Secondary | ICD-10-CM

## 2022-09-20 DIAGNOSIS — I252 Old myocardial infarction: Secondary | ICD-10-CM

## 2022-09-20 DIAGNOSIS — J449 Chronic obstructive pulmonary disease, unspecified: Secondary | ICD-10-CM | POA: Diagnosis not present

## 2022-09-20 DIAGNOSIS — J9611 Chronic respiratory failure with hypoxia: Secondary | ICD-10-CM

## 2022-09-20 DIAGNOSIS — K219 Gastro-esophageal reflux disease without esophagitis: Secondary | ICD-10-CM

## 2022-09-20 DIAGNOSIS — N1831 Chronic kidney disease, stage 3a: Secondary | ICD-10-CM | POA: Diagnosis not present

## 2022-09-20 NOTE — Assessment & Plan Note (Signed)
Stable and asymptomatic.  Continues pantoprazole 40 mg daily.

## 2022-09-20 NOTE — Assessment & Plan Note (Signed)
Well-controlled hypertension BP Readings from Last 3 Encounters:  09/20/22 126/76  09/06/22 (!) 144/86  07/12/22 112/64   Continue losartan 100 mg once a day and continue carvedilol 25 mg twice a day

## 2022-09-20 NOTE — Patient Instructions (Signed)
Health Maintenance After Age 79 After age 79, you are at a higher risk for certain long-term diseases and infections as well as injuries from falls. Falls are a major cause of broken bones and head injuries in people who are older than age 79. Getting regular preventive care can help to keep you healthy and well. Preventive care includes getting regular testing and making lifestyle changes as recommended by your health care provider. Talk with your health care provider about: Which screenings and tests you should have. A screening is a test that checks for a disease when you have no symptoms. A diet and exercise plan that is right for you. What should I know about screenings and tests to prevent falls? Screening and testing are the best ways to find a health problem early. Early diagnosis and treatment give you the best chance of managing medical conditions that are common after age 79. Certain conditions and lifestyle choices may make you more likely to have a fall. Your health care provider may recommend: Regular vision checks. Poor vision and conditions such as cataracts can make you more likely to have a fall. If you wear glasses, make sure to get your prescription updated if your vision changes. Medicine review. Work with your health care provider to regularly review all of the medicines you are taking, including over-the-counter medicines. Ask your health care provider about any side effects that may make you more likely to have a fall. Tell your health care provider if any medicines that you take make you feel dizzy or sleepy. Strength and balance checks. Your health care provider may recommend certain tests to check your strength and balance while standing, walking, or changing positions. Foot health exam. Foot pain and numbness, as well as not wearing proper footwear, can make you more likely to have a fall. Screenings, including: Osteoporosis screening. Osteoporosis is a condition that causes  the bones to get weaker and break more easily. Blood pressure screening. Blood pressure changes and medicines to control blood pressure can make you feel dizzy. Depression screening. You may be more likely to have a fall if you have a fear of falling, feel depressed, or feel unable to do activities that you used to do. Alcohol use screening. Using too much alcohol can affect your balance and may make you more likely to have a fall. Follow these instructions at home: Lifestyle Do not drink alcohol if: Your health care provider tells you not to drink. If you drink alcohol: Limit how much you have to: 0-1 drink a day for women. 0-2 drinks a day for men. Know how much alcohol is in your drink. In the [place] one drink equals one 12 oz bottle of beer (355 mL), one 5 oz glass of wine (148 mL), or one 1 oz glass of hard liquor (44 mL). Do not use any products that contain nicotine or tobacco. These products include cigarettes, chewing tobacco, and vaping devices, such as e-cigarettes. If you need help quitting, ask your health care provider. Activity  Follow a regular exercise program to stay fit. This will help you maintain your balance. Ask your health care provider what types of exercise are appropriate for you. If you need a cane or walker, use it as recommended by your health care provider. Wear supportive shoes that have nonskid soles. Safety  Remove any tripping hazards, such as rugs, cords, and clutter. Install safety equipment such as grab bars in bathrooms and safety rails on stairs. Keep rooms and walkways   well-lit. General instructions Talk with your health care provider about your risks for falling. Tell your health care provider if: You fall. Be sure to tell your health care provider about all falls, even ones that seem minor. You feel dizzy, tiredness (fatigue), or off-balance. Take over-the-counter and prescription medicines only as told by your health care provider. These include  supplements. Eat a healthy diet and maintain a healthy weight. A healthy diet includes low-fat dairy products, low-fat (lean) meats, and fiber from whole grains, beans, and lots of fruits and vegetables. Stay current with your vaccines. Schedule regular health, dental, and eye exams. Summary Having a healthy lifestyle and getting preventive care can help to protect your health and wellness after age 79. Screening and testing are the best way to find a health problem early and help you avoid having a fall. Early diagnosis and treatment give you the best chance for managing medical conditions that are more common for people who are older than age 79. Falls are a major cause of broken bones and head injuries in people who are older than age 79. Take precautions to prevent a fall at home. Work with your health care provider to learn what changes you can make to improve your health and wellness and to prevent falls. This information is not intended to replace advice given to you by your health care provider. Make sure you discuss any questions you have with your health care provider. Document Revised: 03/20/2021 Document Reviewed: 03/20/2021 [employer]vier Patient Education  2023 [employer]  

## 2022-09-20 NOTE — Progress Notes (Signed)
Tammy Ball 81 y.o.   Chief Complaint  Patient presents with   Follow-up    2mnth f/u appt, patient has some medication questions     HISTORY OF PRESENT ILLNESS: This is a 81 y.o. female here for 34-month follow-up of chronic medical problems. Overall doing well. Has some medication questions. Last GI office visit on 09/06/2022 assessment and plan as follows: Assessment and Recommendations:   42) 81 year old female with anemia and melenic stool/+ FOBT 05/2022. Plavix discontinued by cardiology. Remains on ASA 81mg  daily. No further melenic stools. Hg 11.3 -> 11.  Normal iron and ferritin levels 07/12/2022 with low saturation ratio 17.4% on 07/12/2022.  -IBC + ferritin today -EGD +/- colonoscopy at [facility] if the above labs identify iron deficiency. EGD and colonoscopy benefits and risks discussed including risk with sedation, risk of bleeding, perforation and infection. She is at high risk for any endoscopic procedures secondary to her age and multiple comorbidiites including heart disease and pulmonary disease on home oxygen.  -Cardiac clearance previously received 07/18/2022 per Christen Bame cardiology NP  -Patient to contact our office if black stool recurs, to go to the ED if she passes frequent loose  black tarry stools or if she develops CP or worsening SOB -Continue Pantoprazole 40 mg p.o. daily for now   2) NSTEMI 06/09/2022. Plavix discontinued per cardiology on 07/05/2022. She remains on ASA 81mg  QD.    3) History of a hyperplastic sigmoid polyp per colonoscopy 04/2012   4) COPD on home oxygen 2L [place]. Covid 19 infection 07/2022 treated with Molnupiravir x 5 days.    5) History of non-small cell right lung carcinoma    6) History of renal cell carcinoma s/p left nephrectomy  HPI   Prior to Admission medications   Medication Sig Start Date End Date Taking? Authorizing Provider  acetaminophen (TYLENOL) 500 MG tablet Take 500 mg by mouth every 6 (six) hours as needed for headache  (pain). Reported on 05/24/2016   Yes [provider]  albuterol (PROVENTIL) (2.5 MG/3ML) 0.083% nebulizer solution Take 3 mLs (2.5 mg total) by nebulization every 6 (six) hours as needed for wheezing or shortness of breath. 01/09/22  Yes Collene Gobble, MD  albuterol (VENTOLIN HFA) 108 (90 Base) MCG/ACT inhaler Inhale 1-2 puffs into the lungs every 6 (six) hours as needed for wheezing or shortness of breath. 10/25/21  Yes Muranda Coye, Ines Bloomer, MD  Ascorbic Acid (VITAMIN C) 1000 MG tablet Take 1,000 mg by mouth every morning.   Yes [provider]  aspirin EC 81 MG tablet Take 81 mg by mouth every morning.   Yes [provider]  atorvastatin (LIPITOR) 40 MG tablet TAKE 1 TABLET BY MOUTH EVERY DAY Patient taking differently: Take 40 mg by mouth every morning. 05/08/22  Yes Len Kluver, Ines Bloomer, MD  Calcium Carbonate Antacid (TUMS PO) Take 1 tablet by mouth every evening.   Yes [provider]  Carboxymethylcellulose Sodium (ARTIFICIAL TEARS OP) Place 1 drop into both eyes daily as needed (dry eyes).   Yes [provider]  carvedilol (COREG) 25 MG tablet Take 1 tablet (25 mg total) by mouth 2 (two) times daily. 07/04/22  Yes BranchRoyetta Crochet, MD  Cholecalciferol (VITAMIN D3 PO) Take 1 tablet by mouth every morning.   Yes [provider]  diclofenac Sodium (VOLTAREN) 1 % GEL Apply 1 Application topically at bedtime as needed (pain).   Yes [provider]  diphenhydrAMINE (BENADRYL) 25 mg capsule Take 25 mg by  mouth every 6 (six) hours as needed.   Yes [provider]  ferrous sulfate 325 (65 FE) MG tablet Take 1 tablet (325 mg total) by mouth daily with breakfast. 09/14/22  Yes Cirigliano, Vito V, DO  Guaifenesin (MUCINEX MAXIMUM STRENGTH) 1200 MG TB12 Take 1,200 mg by mouth every morning.   Yes [provider]  losartan (COZAAR) 100 MG tablet TAKE 1 TABLET BY MOUTH EVERY DAY Patient taking differently: Take 50 mg by mouth 2  (two) times daily. 04/26/22  Yes Yanique Mulvihill, Ines Bloomer, MD  Multiple Vitamins-Minerals (ZINC PO) Take 1 tablet by mouth every morning.   Yes [provider]  Omega-3 Fatty Acids (FISH OIL) 1200 MG CAPS Take 1,200 mg by mouth every morning.   Yes [provider]  OVER THE COUNTER MEDICATION Apply 1 Application topically See admin instructions. Original vicks - apply topically to knees daily prn for pain   Yes [provider]  OXYGEN Inhale 2 L into the lungs as needed (to keep SATS at or above 90).   Yes [provider]  pantoprazole (PROTONIX) 40 MG tablet Take 40 mg by mouth 2 (two) times daily.   Yes [provider]  sodium chloride (OCEAN) 0.65 % SOLN nasal spray Place 1 spray into both nostrils daily as needed for congestion.   Yes [provider]  traMADol (ULTRAM) 50 MG tablet Take 1 tablet (50 mg total) by mouth every 6 (six) hours as needed (BACK PAIN). 09/06/21  Yes Orlondo Holycross, Ines Bloomer, MD  TRELEGY ELLIPTA 100-62.5-25 MCG/ACT AEPB INHALE 1 PUFF BY MOUTH EVERY DAY 07/17/22  Yes Collene Gobble, MD    Allergies  Allergen Reactions   Bee Venom Anaphylaxis, Shortness Of Breath, Swelling and Other (See Comments)    Swelling at site (reaction to bees and wasps)   Amlodipine Swelling and Other (See Comments)    Swelling of the ankles and hands    Levofloxacin Other (See Comments)    Joint pain    Alendronate Other (See Comments)    Joint pains / hypercalcemia    Hctz [Hydrochlorothiazide] Palpitations and Other (See Comments)    Sweating     Patient Active Problem List   Diagnosis Date Noted   Non-STEMI (non-ST elevated myocardial infarction) ([place]) 06/19/2022   Gastroesophageal reflux disease without esophagitis 06/19/2022   Thickening of wall of gallbladder with pericholecystic fluid 06/19/2022   ACS (acute coronary syndrome) ([place]) 06/09/2022   Hyponatremia 06/09/2022   Epigastric pain 06/09/2022   Chronic bilateral low back  pain without sciatica 09/06/2021   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 ([place]) 02/21/2021   Recurrent major depressive disorder, in full remission ([place]) 02/21/2021   Atherosclerosis of aorta ([place]) 02/21/2021   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   Chronic diastolic CHF (congestive heart failure) ([place])    Stage 3a chronic kidney disease ([place]) 11/10/2020   Allergic rhinitis 01/06/2019   Chronic respiratory failure with hypoxia ([place]) 12/26/2018   Age-related osteoporosis with current pathological fracture 11/21/2018   Renal insufficiency 05/20/2018   History of compression fracture of spine 12/10/2016   Hypercalcemia 06/25/2016   Insomnia 05/16/2016   Pneumonitis, radiation ([place]) 04/11/2016   S/P lobectomy of lung 01/18/2016   Chemotherapy-induced neuropathy ([place]) 11/01/2015   Non-small cell carcinoma of right lung, stage 2 ([place]) 10/03/2015   Hyperlipidemia 08/14/2013   Spondylolisthesis of lumbar region 07/03/2013   Essential hypertension 12/17/2011   COPD GOLD B-C 12/17/2011   Renal cell cancer (  [place]) 12/17/2011   Sciatica of left side 12/17/2011   Depression 12/17/2011    Past Medical History:  Diagnosis Date   Anemia    was while doing chemo   Anxiety    Asthma    Chemotherapy-induced neuropathy ([place]) 11/01/2015   Claustrophobia    COPD (chronic obstructive pulmonary disease) ([place])    Depression    Encounter for antineoplastic chemotherapy 02/09/2016   Glaucoma    Headache    prior to menopause   Heart murmur    History of echocardiogram    Echo 2/17: EF 09-47%, grade 1 diastolic dysfunction, mild MR, trivial pericardial effusion   History of nuclear stress test    Myoview 2/17: no ischemia or scar, EF 79%; low risk   History of radiation therapy 10/30/16-11/06/16   Hyperlipidemia    Hypertension    Insomnia 05/16/2016   Non-small cell carcinoma of right lung, stage 2 ([place]) 10/03/2015   Radiation 02/29/16-04/10/16   50.4  Gy to right central chest   Renal cell carcinoma ([place])    L nephrectomy  in 2012    Past Surgical History:  Procedure Laterality Date   BACK SURGERY     cervical 1991   EYE SURGERY     IR GENERIC HISTORICAL  01/16/2017   IR RADIOLOGIST EVAL & MGMT 01/16/2017 [facility]   IR GENERIC HISTORICAL  01/31/2017   IR FLUORO GUIDED NEEDLE PLC ASPIRATION/INJECTION LOC 01/31/2017 Luanne Bras, MD [facility]   IR GENERIC HISTORICAL  01/31/2017   IR VERTEBROPLASTY CERV/THOR BX INC UNI/BIL INC/INJECT/IMAGING 01/31/2017 Luanne Bras, MD [facility]   IR GENERIC HISTORICAL  01/31/2017   IR VERTEBROPLASTY EA ADDL (T&LS) BX INC UNI/BIL INC INJECT/IMAGING 01/31/2017 Luanne Bras, MD [facility]   kidney cancer     NEPHRECTOMY     SPINE SURGERY     TUBAL LIGATION     VIDEO ASSISTED THORACOSCOPY (VATS)/WEDGE RESECTION Right 01/18/2016   Procedure: VIDEO ASSISTED THORACOSCOPY (VATS)/LUNG RESECTION, THOROCOTOMY, RIGHT UPPER LOBECTOMY, LYMPH NODE DISSECTION, PLACEMENT OF ON Q;  Surgeon: [name], MD;  Location: [facility];  Service: Thoracic;  Laterality: Right;   VIDEO BRONCHOSCOPY Bilateral 09/20/2015   Procedure: VIDEO BRONCHOSCOPY WITHOUT FLUORO;  Surgeon: Rigoberto Noel, MD;  Location: [facility];  Service: Cardiopulmonary;  Laterality: Bilateral;   VIDEO BRONCHOSCOPY N/A 01/18/2016   Procedure: VIDEO BRONCHOSCOPY;  Surgeon: [name], MD;  Location: [facility] OR;  Service: Thoracic;  Laterality: N/A;    Social History   Socioeconomic History   Marital status: Widowed    Spouse name: Not on file   Number of children: 4   Years of education: Not on file   Highest education level: High school graduate  Occupational History   Not on file  Tobacco Use   Smoking status: Former    Packs/day: 1.00    Years: 50.00    Total pack years: 50.00    Types: Cigarettes    Quit date: 03/16/2011    Years since quitting: 11.5   Smokeless tobacco: Never   Tobacco comments:    last 4-5 years  of smoking, smoked 0.5 pack/day   Vaping Use   Vaping Use: Never used  Substance and Sexual Activity   Alcohol use: No    Alcohol/week: 0.0 standard drinks of alcohol   Drug use: No   Sexual activity: Not Currently  Other Topics Concern   Not on file  Social History Narrative   Marital status: divorced; not dating in 2019.  Children: 4 children; 3 grandchildren adult; 4 gg.      Lives: alone in house      Employment: part-time work; substance abuse counselor; [employer].      Tobacco: quit smoking 2012; smoked 45 years      Alcohol: none      Drugs: none      Exercise: none in 2019; due to LEFT sciatica.      ADLs: independent with ADLs; drives.       Advanced Directives: YES: HCPOA: Nicholas Martinez/son.  FULL CODE but no prolonged measures.      Occupation: Substance Abuse Estate agent   No exercise** Merged History Encounter **       ** Data from: 12/14/11 Enc Dept: [facility]       ** Data from: 12/17/11 Enc Dept: [facility]   Substance abuse counselor   Husband deceased   4 great grandchildren   Son works in same substance abuse counseling center as patient   Social Determinants of Radio broadcast assistant Strain: Low Risk  (05/28/2022)   Overall Financial Resource Strain (CARDIA)    Difficulty of Paying Living Expenses: Not hard at all  Food Insecurity: No Food Insecurity (05/28/2022)   Hunger Vital Sign    Worried About Running Out of Food in the Last Year: Never true    Ran Out of Food in the Last Year: Never true  Transportation Needs: No Transportation Needs (05/28/2022)   PRAPARE - Hydrologist (Medical): No    Lack of Transportation (Non-Medical): No  Physical Activity: Insufficiently Active (05/28/2022)   Exercise Vital Sign    Days of Exercise per Week: 7 days    Minutes of Exercise per Session: 20 min  Stress: No Stress Concern Present (05/28/2022)   [place]    Feeling of Stress : Not at all  Social Connections: Moderately Integrated (05/18/2021)   Social Connection and Isolation Panel [NHANES]    Frequency of Communication with Friends and Family: More than three times a week    Frequency of Social Gatherings with Friends and Family: More than three times a week    Attends Religious Services: More than 4 times per year    Active Member of [employer] or Organizations: No    Attends Music therapist: More than 4 times per year    Marital Status: Divorced  Human resources officer Violence: Not At Risk (05/18/2021)   Humiliation, Afraid, Rape, and Kick questionnaire    Fear of Current or Ex-Partner: No    Emotionally Abused: No    Physically Abused: No    Sexually Abused: No    Family History  Problem Relation Age of Onset   Heart disease Sister    Obesity Brother    Heart attack Daughter 41       s/p CABG   Glaucoma Daughter    Breast cancer Sister    Birth defects Sister    Cancer Mother        Bladder Cancer   Hypertension Mother    CAD Mother 12   Cancer Maternal Grandmother      Review of Systems  Constitutional: Negative.  Negative for chills and fever.  HENT: Negative.  Negative for congestion and sore throat.   Respiratory: Negative.  Negative for cough and shortness of breath.   Cardiovascular: Negative.  Negative  for chest pain and palpitations.  Gastrointestinal:  Negative for abdominal pain, nausea and vomiting.  Skin: Negative.  Negative for rash.  Neurological: Negative.  Negative for dizziness and headaches.  All other systems reviewed and are negative.  Today's Vitals   09/20/22 0946  BP: 126/76  Pulse: 61  Temp: 98.2 F (36.8 C)  TempSrc: Oral  SpO2: 95%  Weight: 170 lb 4 oz (77.2 kg)  Height: 5' 3.5" (1.613 m)   Body mass index is 29.69 kg/m.   Physical Exam Vitals reviewed.  Constitutional:      Appearance: Normal appearance.  HENT:      Head: Normocephalic.  Eyes:     Extraocular Movements: Extraocular movements intact.     Pupils: Pupils are equal, round, and reactive to light.  Cardiovascular:     Rate and Rhythm: Normal rate and regular rhythm.     Pulses: Normal pulses.     Heart sounds: Normal heart sounds.  Pulmonary:     Effort: Pulmonary effort is normal.     Breath sounds: Normal breath sounds.  Skin:    General: Skin is warm and dry.     Capillary Refill: Capillary refill takes less than 2 seconds.  Neurological:     General: No focal deficit present.     Mental Status: She is alert and oriented to person, place, and time.  Psychiatric:        Mood and Affect: Mood normal.        Behavior: Behavior normal.      ASSESSMENT & PLAN: A total of 47 minutes was spent with the patient and counseling/coordination of care regarding preparing for this visit, review of most recent office visit notes, review of multiple chronic medical problems and their management, review of most recent blood work results, review of all medications, education on nutrition, prognosis, documentation and need for follow-up.  Problem List Items Addressed This Visit       Cardiovascular and Mediastinum   Essential hypertension - Primary    Well-controlled hypertension BP Readings from Last 3 Encounters:  09/20/22 126/76  09/06/22 (!) 144/86  07/12/22 112/64  Continue losartan 100 mg once a day and continue carvedilol 25 mg twice a day      Atherosclerosis of aorta (HCC)    Continue atorvastatin 40 mg daily.        Respiratory   COPD GOLD B-C    Stable.  Oxygen dependent. Trelegy too expensive.      Chronic respiratory failure with hypoxia (HCC)    Stable.  Oxygen dependent.        Digestive   Gastroesophageal reflux disease without esophagitis    Stable and asymptomatic.  Continues pantoprazole 40 mg daily.        Genitourinary   Stage 3a chronic kidney disease ([place])    Advised to stay well-hydrated and  avoid NSAIDs.        Other   Insomnia    Sleep hygiene discussed. Advised to take melatonin 10 mg at bedtime      History of myocardial infarction    Stable.  On beta-blocker and daily baby aspirin. Plavix was discontinued.      Patient Instructions  Health Maintenance After Age 33 After age 43, you are at a higher risk for certain long-term diseases and infections as well as injuries from falls. Falls are a major cause of broken bones and head injuries in people who are older than age 50. Getting regular preventive care can  help to keep you healthy and well. Preventive care includes getting regular testing and making lifestyle changes as recommended by your health care provider. Talk with your health care provider about: Which screenings and tests you should have. A screening is a test that checks for a disease when you have no symptoms. A diet and exercise plan that is right for you. What should I know about screenings and tests to prevent falls? Screening and testing are the best ways to find a health problem early. Early diagnosis and treatment give you the best chance of managing medical conditions that are common after age 62. Certain conditions and lifestyle choices may make you more likely to have a fall. Your health care provider may recommend: Regular vision checks. Poor vision and conditions such as cataracts can make you more likely to have a fall. If you wear glasses, make sure to get your prescription updated if your vision changes. Medicine review. Work with your health care provider to regularly review all of the medicines you are taking, including over-the-counter medicines. Ask your health care provider about any side effects that may make you more likely to have a fall. Tell your health care provider if any medicines that you take make you feel dizzy or sleepy. Strength and balance checks. Your health care provider may recommend certain tests to check your strength and  balance while standing, walking, or changing positions. Foot health exam. Foot pain and numbness, as well as not wearing proper footwear, can make you more likely to have a fall. Screenings, including: Osteoporosis screening. Osteoporosis is a condition that causes the bones to get weaker and break more easily. Blood pressure screening. Blood pressure changes and medicines to control blood pressure can make you feel dizzy. Depression screening. You may be more likely to have a fall if you have a fear of falling, feel depressed, or feel unable to do activities that you used to do. Alcohol use screening. Using too much alcohol can affect your balance and may make you more likely to have a fall. Follow these instructions at home: Lifestyle Do not drink alcohol if: Your health care provider tells you not to drink. If you drink alcohol: Limit how much you have to: 0-1 drink a day for women. 0-2 drinks a day for men. Know how much alcohol is in your drink. In the [place] one drink equals one 12 oz bottle of beer (355 mL), one 5 oz glass of wine (148 mL), or one 1 oz glass of hard liquor (44 mL). Do not use any products that contain nicotine or tobacco. These products include cigarettes, chewing tobacco, and vaping devices, such as e-cigarettes. If you need help quitting, ask your health care provider. Activity  Follow a regular exercise program to stay fit. This will help you maintain your balance. Ask your health care provider what types of exercise are appropriate for you. If you need a cane or walker, use it as recommended by your health care provider. Wear supportive shoes that have nonskid soles. Safety  Remove any tripping hazards, such as rugs, cords, and clutter. Install safety equipment such as grab bars in bathrooms and safety rails on stairs. Keep rooms and walkways well-lit. General instructions Talk with your health care provider about your risks for falling. Tell your health care  provider if: You fall. Be sure to tell your health care provider about all falls, even ones that seem minor. You feel dizzy, tiredness (fatigue), or off-balance. Take over-the-counter and prescription  medicines only as told by your health care provider. These include supplements. Eat a healthy diet and maintain a healthy weight. A healthy diet includes low-fat dairy products, low-fat (lean) meats, and fiber from whole grains, beans, and lots of fruits and vegetables. Stay current with your vaccines. Schedule regular health, dental, and eye exams. Summary Having a healthy lifestyle and getting preventive care can help to protect your health and wellness after age 9. Screening and testing are the best way to find a health problem early and help you avoid having a fall. Early diagnosis and treatment give you the best chance for managing medical conditions that are more common for people who are older than age 21. Falls are a major cause of broken bones and head injuries in people who are older than age 1. Take precautions to prevent a fall at home. Work with your health care provider to learn what changes you can make to improve your health and wellness and to prevent falls. This information is not intended to replace advice given to you by your health care provider. Make sure you discuss any questions you have with your health care provider. Document Revised: 03/20/2021 Document Reviewed: 03/20/2021 Elsevier Patient Education  [place], MD [facility]

## 2022-09-20 NOTE — Assessment & Plan Note (Signed)
Stable.  Oxygen dependent.

## 2022-09-20 NOTE — Assessment & Plan Note (Signed)
Stable.  On beta-blocker and daily baby aspirin. Plavix was discontinued.

## 2022-09-20 NOTE — Assessment & Plan Note (Signed)
Sleep hygiene discussed. Advised to take melatonin 10 mg at bedtime

## 2022-09-20 NOTE — Assessment & Plan Note (Signed)
Advised to stay well-hydrated and avoid NSAIDs. ?

## 2022-09-20 NOTE — Assessment & Plan Note (Signed)
Continue atorvastatin 40 mg daily.  

## 2022-09-20 NOTE — Assessment & Plan Note (Signed)
Stable.  Oxygen dependent. Trelegy too expensive.

## 2022-10-01 ENCOUNTER — Other Ambulatory Visit: Payer: Self-pay

## 2022-10-01 ENCOUNTER — Telehealth: Payer: Medicare Other

## 2022-10-05 ENCOUNTER — Other Ambulatory Visit: Payer: Self-pay | Admitting: Internal Medicine

## 2022-10-05 DIAGNOSIS — I214 Non-ST elevation (NSTEMI) myocardial infarction: Secondary | ICD-10-CM

## 2022-10-18 ENCOUNTER — Telehealth: Payer: Self-pay | Admitting: Nurse Practitioner

## 2022-10-18 NOTE — Telephone Encounter (Signed)
Tammy Ball, refer to office visit 09/06/2022 and lab 09/17/2022 notes, pls contact patient and schedule her for an EGD and colonoscopy with Dr. Bryan Lemma at [facility] (if not already scheduled) as an updated cardiac clearance was received 09/19/2022 per Coletta Memos cardiology NP as follows:   Primary Cardiologist: Janina Mayo, MD   Chart reviewed as part of pre-operative protocol coverage. Given past medical history and time since last visit, based on ACC/AHA guidelines, Tammy Ball would be at acceptable risk for the planned procedure without further cardiovascular testing.    Patient was advised that if she develops new symptoms prior to surgery to contact our office to arrange a follow-up appointment.  She verbalized understanding.   Regarding ASA therapy, we recommend continuation of ASA throughout the perioperative period as patient is s/p NSTEMI in 05/2022.

## 2022-10-19 ENCOUNTER — Other Ambulatory Visit: Payer: Self-pay

## 2022-10-19 DIAGNOSIS — D649 Anemia, unspecified: Secondary | ICD-10-CM

## 2022-10-19 NOTE — Telephone Encounter (Signed)
Pt scheduled for EGD/colon at [facility] hospital on 11/15/22 at 9:45 am. Ambulatory referral placed. Previsit scheduled for 10/31/22 at 2:30 pm. Pt verbalized understanding and had no other concerns at end of call.

## 2022-10-29 ENCOUNTER — Other Ambulatory Visit: Payer: Self-pay | Admitting: Emergency Medicine

## 2022-10-31 ENCOUNTER — Ambulatory Visit (AMBULATORY_SURGERY_CENTER): Payer: Medicare Other

## 2022-10-31 VITALS — Ht 63.0 in | Wt 171.0 lb

## 2022-10-31 DIAGNOSIS — D649 Anemia, unspecified: Secondary | ICD-10-CM

## 2022-10-31 NOTE — Progress Notes (Signed)
No egg or soy allergy known to patient  No issues known to pt with past sedation with any surgeries or procedures Patient denies ever being told they had issues or difficulty with intubation  No FH of Malignant Hyperthermia Pt is not on diet pills Pt is not on  home 02  Pt is not on blood thinners  Pt denies issues with constipation  No A fib or A flutter Have any cardiac testing pending--End of February Pt instructed to use [email or web address].com or GoodRx for a price reduction on prep

## 2022-11-06 ENCOUNTER — Telehealth: Payer: Self-pay | Admitting: Gastroenterology

## 2022-11-06 ENCOUNTER — Encounter (HOSPITAL_COMMUNITY): Payer: Self-pay | Admitting: Gastroenterology

## 2022-11-06 ENCOUNTER — Encounter: Payer: Self-pay | Admitting: Gastroenterology

## 2022-11-06 NOTE — Telephone Encounter (Signed)
Patient called, stating she was not aware she was having a double procedure at the hospital tomorrow. Was calling to ask if her instructions were different. Please advise.

## 2022-11-06 NOTE — Progress Notes (Signed)
Patient needs med rec completed. Provided with number to call with instructions to call with medication list ready.  COVID Vaccine Completed: yes  Date of COVID positive in last 90 days: 07/25/22, still on 2L O2 via [place]  PCP - Agustina Caroli, MD Cardiologist - Phineas Inches, MD  Cardiac clearance by Coletta Memos 09/19/22 in Epic  Chest x-ray - 06/09/22 Epic EKG - 06/28/22 Epic Stress Test - 01/05/16 Epic ECHO - 06/10/22 Epic Cardiac Cath - n/a Pacemaker/ICD device last checked: n/a Spinal Cord Stimulator: n/a  Bowel Prep - waiting to get in mail, instructed to call if does not receive next few days  Sleep Study - n/a CPAP -   Fasting Blood Sugar - n/a Checks Blood Sugar _____ times a day  Last dose of GLP1 agonist-  N/A GLP1 instructions:  N/A   Last dose of SGLT-2 inhibitors-  N/A SGLT-2 instructions: N/A   Blood Thinner Instructions: Aspirin Instructions: ASA 81, continue per cards (in clearance note) Last Dose:  Activity level: Can go up a flight of stairs and perform activities of daily living without stopping and without symptoms of chest pain. Endorses SOB with activity post COVID, she is using 2L O2 "basically all the time". (Chart sent to [facility])  Anesthesia review: HTN, CHF, atherosclerosis of aorta, COPD, lung cancer, CKD, 2L O2 PRN (pretty much  all the time per pt, anemia, heart murmur, nephrectomy, NSTEMI, SOB with activity  Patient denies fever, cough and chest   Patient verbalized understanding of instructions that were given to them.

## 2022-11-07 ENCOUNTER — Telehealth: Payer: Self-pay

## 2022-11-07 ENCOUNTER — Other Ambulatory Visit: Payer: Self-pay

## 2022-11-07 DIAGNOSIS — Z1211 Encounter for screening for malignant neoplasm of colon: Secondary | ICD-10-CM

## 2022-11-07 MED ORDER — NA SULFATE-K SULFATE-MG SULF 17.5-3.13-1.6 GM/177ML PO SOLN: 1.0000 | Freq: Once | ORAL | 0 refills | Status: DC

## 2022-11-07 MED ORDER — NA SULFATE-K SULFATE-MG SULF 17.5-3.13-1.6 GM/177ML PO SOLN: 1.0000 | Freq: Once | ORAL | 0 refills | Status: AC

## 2022-11-07 NOTE — Telephone Encounter (Signed)
Returned patients call regarding her procedure on 11/15/2022. New instructions for prep reviewed with patient over the phone. All questions were answered. Prep instructions sent to the patient via myChart and via mail. Prescription sent over to patients pharmacy. Patient understands to call back if she has any further questions.

## 2022-11-07 NOTE — Telephone Encounter (Signed)
Made in error

## 2022-11-07 NOTE — Telephone Encounter (Signed)
Patient phone call returned regarding her instructions for her upcoming apt on 11/15/22. Patient given new instructions via mail and via mychart for her colonoscopy. Prep instructions given over the phone. Prescription sent to pharmacy. Patient understands to call back if she has any further questions.

## 2022-11-15 ENCOUNTER — Ambulatory Visit (HOSPITAL_BASED_OUTPATIENT_CLINIC_OR_DEPARTMENT_OTHER): Payer: Medicare Other | Admitting: Anesthesiology

## 2022-11-15 ENCOUNTER — Encounter (HOSPITAL_COMMUNITY): Payer: Self-pay | Admitting: Gastroenterology

## 2022-11-15 ENCOUNTER — Ambulatory Visit (HOSPITAL_COMMUNITY)
Admission: RE | Admit: 2022-11-15 | Discharge: 2022-11-15 | Disposition: A | Discharge status: Home / Self Care | Payer: Medicare Other | Attending: Gastroenterology | Admitting: Gastroenterology

## 2022-11-15 ENCOUNTER — Ambulatory Visit (HOSPITAL_COMMUNITY): Payer: Medicare Other | Admitting: Anesthesiology

## 2022-11-15 ENCOUNTER — Encounter (HOSPITAL_COMMUNITY)
Admission: RE | Disposition: A | Discharge status: Home / Self Care | Payer: Self-pay | Source: Home / Self Care | Attending: Gastroenterology

## 2022-11-15 ENCOUNTER — Other Ambulatory Visit: Payer: Self-pay

## 2022-11-15 DIAGNOSIS — Z923 Personal history of irradiation: Secondary | ICD-10-CM | POA: Diagnosis not present

## 2022-11-15 DIAGNOSIS — I10 Essential (primary) hypertension: Secondary | ICD-10-CM | POA: Diagnosis not present

## 2022-11-15 DIAGNOSIS — I110 Hypertensive heart disease with heart failure: Secondary | ICD-10-CM | POA: Insufficient documentation

## 2022-11-15 DIAGNOSIS — D125 Benign neoplasm of sigmoid colon: Secondary | ICD-10-CM | POA: Insufficient documentation

## 2022-11-15 DIAGNOSIS — D122 Benign neoplasm of ascending colon: Secondary | ICD-10-CM

## 2022-11-15 DIAGNOSIS — D124 Benign neoplasm of descending colon: Secondary | ICD-10-CM

## 2022-11-15 DIAGNOSIS — R195 Other fecal abnormalities: Secondary | ICD-10-CM

## 2022-11-15 DIAGNOSIS — Z9221 Personal history of antineoplastic chemotherapy: Secondary | ICD-10-CM | POA: Diagnosis not present

## 2022-11-15 DIAGNOSIS — D509 Iron deficiency anemia, unspecified: Secondary | ICD-10-CM

## 2022-11-15 DIAGNOSIS — D175 Benign lipomatous neoplasm of intra-abdominal organs: Secondary | ICD-10-CM | POA: Diagnosis not present

## 2022-11-15 DIAGNOSIS — Z79899 Other long term (current) drug therapy: Secondary | ICD-10-CM | POA: Insufficient documentation

## 2022-11-15 DIAGNOSIS — D123 Benign neoplasm of transverse colon: Secondary | ICD-10-CM

## 2022-11-15 DIAGNOSIS — K219 Gastro-esophageal reflux disease without esophagitis: Secondary | ICD-10-CM | POA: Diagnosis not present

## 2022-11-15 DIAGNOSIS — J449 Chronic obstructive pulmonary disease, unspecified: Secondary | ICD-10-CM | POA: Diagnosis not present

## 2022-11-15 DIAGNOSIS — K921 Melena: Secondary | ICD-10-CM | POA: Insufficient documentation

## 2022-11-15 DIAGNOSIS — Z85528 Personal history of other malignant neoplasm of kidney: Secondary | ICD-10-CM | POA: Diagnosis not present

## 2022-11-15 DIAGNOSIS — E785 Hyperlipidemia, unspecified: Secondary | ICD-10-CM | POA: Insufficient documentation

## 2022-11-15 DIAGNOSIS — Z905 Acquired absence of kidney: Secondary | ICD-10-CM | POA: Diagnosis not present

## 2022-11-15 DIAGNOSIS — D120 Benign neoplasm of cecum: Secondary | ICD-10-CM | POA: Diagnosis not present

## 2022-11-15 DIAGNOSIS — K648 Other hemorrhoids: Secondary | ICD-10-CM

## 2022-11-15 DIAGNOSIS — Z87891 Personal history of nicotine dependence: Secondary | ICD-10-CM | POA: Insufficient documentation

## 2022-11-15 DIAGNOSIS — K644 Residual hemorrhoidal skin tags: Secondary | ICD-10-CM | POA: Diagnosis not present

## 2022-11-15 DIAGNOSIS — I509 Heart failure, unspecified: Secondary | ICD-10-CM | POA: Insufficient documentation

## 2022-11-15 DIAGNOSIS — I252 Old myocardial infarction: Secondary | ICD-10-CM | POA: Diagnosis not present

## 2022-11-15 DIAGNOSIS — Z85118 Personal history of other malignant neoplasm of bronchus and lung: Secondary | ICD-10-CM | POA: Diagnosis not present

## 2022-11-15 DIAGNOSIS — D649 Anemia, unspecified: Secondary | ICD-10-CM | POA: Diagnosis not present

## 2022-11-15 DIAGNOSIS — K2950 Unspecified chronic gastritis without bleeding: Secondary | ICD-10-CM | POA: Insufficient documentation

## 2022-11-15 DIAGNOSIS — K2971 Gastritis, unspecified, with bleeding: Secondary | ICD-10-CM

## 2022-11-15 DIAGNOSIS — K2970 Gastritis, unspecified, without bleeding: Secondary | ICD-10-CM | POA: Diagnosis not present

## 2022-11-15 HISTORY — DX: Pneumonia, unspecified organism: J18.9

## 2022-11-15 HISTORY — PX: COLONOSCOPY WITH PROPOFOL: SHX5780

## 2022-11-15 HISTORY — PX: POLYPECTOMY: SHX5525

## 2022-11-15 HISTORY — PX: BIOPSY: SHX5522

## 2022-11-15 HISTORY — DX: Unspecified osteoarthritis, unspecified site: M19.90

## 2022-11-15 HISTORY — PX: ESOPHAGOGASTRODUODENOSCOPY (EGD) WITH PROPOFOL: SHX5813

## 2022-11-15 SURGERY — ESOPHAGOGASTRODUODENOSCOPY (EGD) WITH PROPOFOL
Anesthesia: Monitor Anesthesia Care

## 2022-11-15 MED ORDER — LACTATED RINGERS IV SOLN: INTRAVENOUS | Status: DC

## 2022-11-15 MED ORDER — PROPOFOL 500 MG/50ML IV EMUL
INTRAVENOUS | Status: DC | PRN
  Administered 2022-11-15: 100 ug/kg/min via INTRAVENOUS

## 2022-11-15 MED ORDER — PROPOFOL 500 MG/50ML IV EMUL
INTRAVENOUS | Status: AC
  Filled 2022-11-15: qty 50

## 2022-11-15 MED ORDER — SODIUM CHLORIDE 0.9 % IV SOLN: INTRAVENOUS | Status: DC

## 2022-11-15 MED ORDER — LIDOCAINE 2% (20 MG/ML) 5 ML SYRINGE
INTRAMUSCULAR | Status: DC | PRN
  Administered 2022-11-15: 40 mg via INTRAVENOUS

## 2022-11-15 MED ORDER — PROPOFOL 10 MG/ML IV BOLUS
INTRAVENOUS | Status: DC | PRN
  Administered 2022-11-15 (×3): 20 mg via INTRAVENOUS

## 2022-11-15 MED ORDER — PHENYLEPHRINE 80 MCG/ML (10ML) SYRINGE FOR IV PUSH (FOR BLOOD PRESSURE SUPPORT)
PREFILLED_SYRINGE | INTRAVENOUS | Status: DC | PRN
  Administered 2022-11-15 (×3): 160 ug via INTRAVENOUS

## 2022-11-15 SURGICAL SUPPLY — 25 items

## 2022-11-15 NOTE — Op Note (Signed)
[facility] [name] Procedure Date: 11/15/2022 MRN: [id number] Attending MD: Gerrit Heck , MD, [id number] Date of Birth: 02/18/1941 CSN: [id number] Age: 82 Admit Type: Outpatient Procedure:                Upper GI endoscopy w/ biopsy Indications:              Iron deficiency anemia, Heme positive stool, Melena Providers:                Gerrit Heck, MD, Dulcy Fanny, William Dalton, Technician Referring MD:             Noralyn Pick Medicines:                Monitored Anesthesia Care Complications:            No immediate complications. Estimated Blood Loss:     Estimated blood loss was minimal. Procedure:                Pre-Anesthesia Assessment:                           - Prior to the procedure, a History and Physical                            was performed, and patient medications and                            allergies were reviewed. The patient's tolerance of                            previous anesthesia was also reviewed. The risks                            and benefits of the procedure and the sedation                            options and risks were discussed with the patient.                            All questions were answered, and informed consent                            was obtained. Prior Anticoagulants: The patient has                            taken no anticoagulant or antiplatelet agents                            except for aspirin. ASA Grade Assessment: III - A                            patient with severe systemic disease. After  reviewing the risks and benefits, the patient was                            deemed in satisfactory condition to undergo the                            procedure.                           After obtaining informed consent, the endoscope was                            passed under direct vision. Throughout the                             procedure, the patient's blood pressure, pulse, and                            oxygen saturations were monitored continuously. The                            [id number]-H190 ([id number]) Olympus endoscope was introduced                            through the mouth, and advanced to the third part                            of duodenum. The upper GI endoscopy was                            accomplished without difficulty. The patient                            tolerated the procedure well. Scope In: Scope Out: Findings:      The examined esophagus was normal.      Scattered minimal inflammation characterized by erythema was found in       the gastric body and in the gastric antrum. Biopsies were taken with a       cold forceps for histology. Estimated blood loss was minimal.      The examined duodenum was normal. Biopsies were taken with a cold       forceps for histology. Estimated blood loss was minimal. Impression:               - Normal esophagus.                           - Gastritis. Biopsied.                           - Normal examined duodenum. Biopsied.                           - There were no areas of active bleeding nor  stigmata of recent occult bleeding noted on this                            study. Moderate Sedation:      Not Applicable - Patient had care per Anesthesia. Recommendation:           - Continue present medications.                           - Await pathology results.                           - Perform a colonoscopy today.                           - Additional recommendations pending colonoscopy                            findings. If colonoscopy and the pathology results                            from this procedure are unrevealing, will plan for                            VCE for further small bowel interrogation. Procedure Code(s):        --- Professional ---                           [phone], Esophagogastroduodenoscopy,  flexible,                            transoral; with biopsy, single or multiple Diagnosis Code(s):        --- Professional ---                           K29.70, Gastritis, unspecified, without bleeding                           D50.9, Iron deficiency anemia, unspecified                           R19.5, Other fecal abnormalities                           K92.1, Melena (includes Hematochezia) CPT copyright 2022 American Medical Association. All rights reserved. The codes documented in this report are preliminary and upon coder review may  be revised to meet current compliance requirements. Gerrit Heck, MD 11/15/2022 10:45:31 AM Number of Addenda: 0

## 2022-11-15 NOTE — H&P (Signed)
GASTROENTEROLOGY PROCEDURE H&P NOTE   Primary Care Physician: Horald Pollen, MD    Reason for Procedure:  Iron deficiency anemia, heme positive stool, melena  Plan:    EGD, colonoscopy  Patient is appropriate for endoscopic procedure(s) at [facility].  The nature of the procedure, as well as the risks, benefits, and alternatives were carefully and thoroughly reviewed with the patient. Ample time for discussion and questions allowed. The patient understood, was satisfied, and agreed to proceed.     HPI: Tammy Ball is a 82 y.o. female who presents for EGD and colonoscopy for diagnostic and therapeutic intent due to iron deficiency anemia, heme positive stool, melena.  History of NSTEMI in 05/2022, and was initially treated with DAPT, but Plavix stopped in 06/2022 due to anemia, melena, heme positive stool.  Has continued ASA 81 mg/day.  Was started on oral iron in October, with largely stable H/H, but no appreciable improvement.  Procedures scheduled at [facility] due to elevated periprocedural risks from underlying comorbidities and chronic home O2 use.  Past Medical History:  Diagnosis Date   Anemia    was while doing chemo   Anxiety    Arthritis    Asthma    Chemotherapy-induced neuropathy ([place]) 11/01/2015   CHF (congestive heart failure) (HCC)    Claustrophobia    COPD (chronic obstructive pulmonary disease) ([place])    Depression    Encounter for antineoplastic chemotherapy 02/09/2016   GERD (gastroesophageal reflux disease)    Headache    prior to menopause   Heart murmur    History of echocardiogram    Echo 2/17: EF 41-66%, grade 1 diastolic dysfunction, mild MR, trivial pericardial effusion   History of nuclear stress test    Myoview 2/17: no ischemia or scar, EF 79%; low risk   History of radiation therapy 10/30/16-11/06/16   Hyperlipidemia    Hypertension    Insomnia 05/16/2016   Myocardial infarction [facility])    Non-small cell  carcinoma of right lung, stage 2 ([place]) 10/03/2015   Oxygen deficiency    Pneumonia    Radiation 02/29/16-04/10/16   50.4 Gy to right central chest   Renal cell carcinoma ([place])    L nephrectomy  in 2012    Past Surgical History:  Procedure Laterality Date   BACK SURGERY     cervical 1991   EYE SURGERY     IR GENERIC HISTORICAL  01/16/2017   IR RADIOLOGIST EVAL & MGMT 01/16/2017 [facility]   IR GENERIC HISTORICAL  01/31/2017   IR FLUORO GUIDED NEEDLE PLC ASPIRATION/INJECTION LOC 01/31/2017 Luanne Bras, MD [facility]   IR GENERIC HISTORICAL  01/31/2017   IR VERTEBROPLASTY CERV/THOR BX INC UNI/BIL INC/INJECT/IMAGING 01/31/2017 Luanne Bras, MD [facility]   IR GENERIC HISTORICAL  01/31/2017   IR VERTEBROPLASTY EA ADDL (T&LS) BX INC UNI/BIL INC INJECT/IMAGING 01/31/2017 Luanne Bras, MD [facility]   kidney cancer     LOBECTOMY Right    RUL   NEPHRECTOMY  2012   SPINE SURGERY     TUBAL LIGATION     VIDEO ASSISTED THORACOSCOPY (VATS)/WEDGE RESECTION Right 01/18/2016   Procedure: VIDEO ASSISTED THORACOSCOPY (VATS)/LUNG RESECTION, THOROCOTOMY, RIGHT UPPER LOBECTOMY, LYMPH NODE DISSECTION, PLACEMENT OF ON Q;  Surgeon: [name], MD;  Location: [place];  Service: Thoracic;  Laterality: Right;   VIDEO BRONCHOSCOPY Bilateral 09/20/2015   Procedure: VIDEO BRONCHOSCOPY WITHOUT FLUORO;  Surgeon: Rigoberto Noel, MD;  Location: [facility];  Service: Cardiopulmonary;  Laterality: Bilateral;   VIDEO BRONCHOSCOPY N/A 01/18/2016   Procedure: VIDEO BRONCHOSCOPY;  Surgeon: [name], MD;  Location: [facility] OR;  Service: Thoracic;  Laterality: N/A;    Prior to Admission medications   Medication Sig Start Date End Date Taking? Authorizing Provider  albuterol (PROVENTIL) (2.5 MG/3ML) 0.083% nebulizer solution Take 3 mLs (2.5 mg total) by nebulization every 6 (six) hours as needed for wheezing or shortness of breath. 01/09/22  Yes Collene Gobble, MD  Ascorbic Acid (VITAMIN C)  1000 MG tablet Take 1,000 mg by mouth every morning.   Yes [provider]  aspirin EC 81 MG tablet Take 81 mg by mouth every morning.   Yes [provider]  atorvastatin (LIPITOR) 40 MG tablet TAKE 1 TABLET BY MOUTH EVERY DAY 05/08/22  Yes [name], Ines Bloomer, MD  Calcium Carbonate Antacid (TUMS PO) Take 1 tablet by mouth 4 (four) times daily as needed (heartburn).   Yes [provider]  Carboxymethylcellulose Sodium (ARTIFICIAL TEARS OP) Place 1 drop into both eyes daily as needed (dry eyes).   Yes [provider]  carvedilol (COREG) 25 MG tablet TAKE 1 TABLET BY MOUTH TWICE A DAY 10/08/22  Yes Branch, Royetta Crochet, MD  cholecalciferol (VITAMIN D3) 25 MCG (1000 UNIT) tablet Take 1,000 Units by mouth daily.   Yes [provider]  diphenhydrAMINE (BENADRYL) 25 mg capsule Take 25 mg by mouth at bedtime as needed for allergies.   Yes [provider]  ferrous sulfate 325 (65 FE) MG tablet Take 1 tablet (325 mg total) by mouth daily with breakfast. 09/14/22  Yes Louise Victory V, DO  Guaifenesin (MUCINEX MAXIMUM STRENGTH) 1200 MG TB12 Take 1,200 mg by mouth every morning.   Yes [provider]  losartan (COZAAR) 100 MG tablet TAKE 1 TABLET BY MOUTH EVERY DAY Patient taking differently: Take 50 mg by mouth 2 (two) times daily. 10/29/22  Yes [name], Ines Bloomer, MD  Omega-3 Fatty Acids (FISH OIL) 1200 MG CAPS Take 1,200 mg by mouth every morning.   Yes [provider]  OXYGEN Inhale 2 L into the lungs as needed (to keep SATS at or above 90).   Yes [provider]  pantoprazole (PROTONIX) 40 MG tablet Take 40 mg by mouth 2 (two) times daily.   Yes [provider]  sodium chloride (OCEAN) 0.65 % SOLN nasal spray Place 1 spray into both nostrils at bedtime as needed for congestion.   Yes [provider]  TRELEGY ELLIPTA 100-62.5-25 MCG/ACT AEPB INHALE 1 PUFF BY MOUTH EVERY DAY 07/17/22  Yes Byrum, Rose Fillers, MD   acetaminophen (TYLENOL) 500 MG tablet Take 500 mg by mouth every 6 (six) hours as needed for headache or moderate pain (pain).    [provider]  albuterol (VENTOLIN HFA) 108 (90 Base) MCG/ACT inhaler Inhale 1-2 puffs into the lungs every 6 (six) hours as needed for wheezing or shortness of breath. 10/25/21   Horald Pollen, MD  traMADol (ULTRAM) 50 MG tablet Take 1 tablet (50 mg total) by mouth every 6 (six) hours as needed (BACK PAIN). 09/06/21   Horald Pollen, MD    Current Facility-Administered Medications  Medication Dose Route Frequency Provider Last Rate Last Admin   0.9 %  sodium chloride infusion   Intravenous Continuous Demarko Zeimet V, DO        Allergies as of 10/19/2022 - Review Complete 09/20/2022  Allergen Reaction Noted   Bee venom Anaphylaxis, Shortness Of Breath, Swelling,  and Other (See Comments) 09/08/2015   Amlodipine Swelling and Other (See Comments) 07/18/2013   Levofloxacin Other (See Comments) 12/17/2011   Alendronate Other (See Comments) 06/26/2021   Hctz [hydrochlorothiazide] Palpitations and Other (See Comments) 12/02/2013    Family History  Problem Relation Age of Onset   Cancer Mother        Bladder Cancer   Hypertension Mother    CAD Mother 14   Heart disease Sister    Breast cancer Sister    Birth defects Sister    Obesity Brother    Cancer Maternal Grandmother    Heart attack Daughter 19       s/p CABG   Glaucoma Daughter    Colon cancer Neg Hx    Colon polyps Neg Hx    Esophageal cancer Neg Hx    Rectal cancer Neg Hx    Stomach cancer Neg Hx     Social History   Socioeconomic History   Marital status: Widowed    Spouse name: Not on file   Number of children: 4   Years of education: Not on file   Highest education level: High school graduate  Occupational History   Not on file  Tobacco Use   Smoking status: Former    Packs/day: 1.00    Years: 50.00    Total pack years: 50.00    Types: Cigarettes     Quit date: 03/16/2011    Years since quitting: 11.6   Smokeless tobacco: Never   Tobacco comments:    last 4-5 years of smoking, smoked 0.5 pack/day   Vaping Use   Vaping Use: Never used  Substance and Sexual Activity   Alcohol use: No    Alcohol/week: 0.0 standard drinks of alcohol   Drug use: No   Sexual activity: Not Currently  Other Topics Concern   Not on file  Social History Narrative   Marital status: divorced; not dating in 2019.      Children: 4 children; 3 grandchildren adult; 4 gg.      Lives: alone in house      Employment: part-time work; substance abuse counselor; [employer].      Tobacco: quit smoking 2012; smoked 45 years      Alcohol: none      Drugs: none      Exercise: none in 2019; due to LEFT sciatica.      ADLs: independent with ADLs; drives.       Advanced Directives: YES: HCPOA: Nicholas Martinez/son.  FULL CODE but no prolonged measures.      Occupation: Substance Abuse Estate agent   No exercise** Merged History Encounter **       ** Data from: 12/14/11 Enc Dept: [facility]       ** Data from: 12/17/11 Enc Dept: [facility]   Substance abuse counselor   Husband deceased   4 great grandchildren   Son works in same substance abuse counseling center as patient   Social Determinants of Radio broadcast assistant Strain: Low Risk  (05/28/2022)   Overall Financial Resource Strain (CARDIA)    Difficulty of Paying Living Expenses: Not hard at all  Food Insecurity: No Food Insecurity (05/28/2022)   Hunger Vital Sign    Worried About Running Out of Food in the Last Year: Never true    Ran Out of Food in the Last Year: Never true  Transportation Needs: No Transportation Needs (05/28/2022)  PRAPARE - Hydrologist (Medical): No    Lack of Transportation (Non-Medical): No  Physical Activity: Insufficiently Active (05/28/2022)   Exercise Vital Sign    Days of Exercise per Week: 7  days    Minutes of Exercise per Session: 20 min  Stress: No Stress Concern Present (05/28/2022)   [place]    Feeling of Stress : Not at all  Social Connections: Moderately Integrated (05/18/2021)   Social Connection and Isolation Panel [NHANES]    Frequency of Communication with Friends and Family: More than three times a week    Frequency of Social Gatherings with Friends and Family: More than three times a week    Attends Religious Services: More than 4 times per year    Active Member of [employer] or Organizations: No    Attends Music therapist: More than 4 times per year    Marital Status: Divorced  Human resources officer Violence: Not At Risk (05/18/2021)   Humiliation, Afraid, Rape, and Kick questionnaire    Fear of Current or Ex-Partner: No    Emotionally Abused: No    Physically Abused: No    Sexually Abused: No    Physical Exam: Vital signs in last 24 hours: @Ht  5' 3.5" (1.613 m)   Wt 77.1 kg   BMI 29.64 kg/m  GEN: NAD EYE: Sclerae anicteric ENT: MMM CV: Non-tachycardic Pulm: CTA b/l GI: Soft, NT/ND NEURO:  Alert & Oriented x [place], DO [facility]   11/15/2022 8:40 AM

## 2022-11-15 NOTE — Anesthesia Preprocedure Evaluation (Addendum)
Anesthesia Evaluation  Patient identified by MRN, date of birth, ID band Patient awake    Reviewed: Allergy & Precautions, H&P , NPO status , Patient's Chart, lab work & pertinent test results  Airway Mallampati: II  TM Distance: >3 FB Neck ROM: Full    Dental no notable dental hx.    Pulmonary COPD, former smoker VATS   breath sounds clear to auscultation + decreased breath sounds      Cardiovascular hypertension, + Past MI  Normal cardiovascular exam Rhythm:Regular Rate:Normal     Neuro/Psych negative neurological ROS  negative psych ROS   GI/Hepatic Neg liver ROS,GERD  ,,  Endo/Other  negative endocrine ROS    Renal/GU Renal InsufficiencyRenal disease  negative genitourinary   Musculoskeletal negative musculoskeletal ROS (+)    Abdominal   Peds negative pediatric ROS (+)  Hematology  (+) Blood dyscrasia, anemia   Anesthesia Other Findings   Reproductive/Obstetrics negative OB ROS                              Anesthesia Physical Anesthesia Plan  ASA: 3  Anesthesia Plan: MAC   Post-op Pain Management: Minimal or no pain anticipated   Induction: Intravenous  PONV Risk Score and Plan: 2 and Propofol infusion and Treatment may vary due to age or medical condition  Airway Management Planned: Simple Face Mask  Additional Equipment:   Intra-op Plan:   Post-operative Plan:   Informed Consent: I have reviewed the patients History and Physical, chart, labs and discussed the procedure including the risks, benefits and alternatives for the proposed anesthesia with the patient or authorized representative who has indicated his/her understanding and acceptance.     Dental advisory given  Plan Discussed with: CRNA and Surgeon  Anesthesia Plan Comments:         Anesthesia Quick Evaluation

## 2022-11-15 NOTE — Discharge Instructions (Signed)
YOU HAD AN ENDOSCOPIC PROCEDURE TODAY: Refer to the procedure report and other information in the discharge instructions given to you for any specific questions about what was found during the examination. If this information does not answer your questions, please call [place] office at [phone] to clarify.  ° °YOU SHOULD EXPECT: Some feelings of bloating in the abdomen. Passage of more gas than usual. Walking can help get rid of the air that was put into your GI tract during the procedure and reduce the bloating. If you had a lower endoscopy (such as a colonoscopy or flexible sigmoidoscopy) you may notice spotting of blood in your stool or on the toilet paper. Some abdominal soreness may be present for a day or two, also. ° °DIET: Your first meal following the procedure should be a light meal and then it is ok to progress to your normal diet. A half-sandwich or bowl of soup is an example of a good first meal. Heavy or fried foods are harder to digest and may make you feel nauseous or bloated. Drink plenty of fluids but you should avoid alcoholic beverages for 24 hours. If you had a esophageal dilation, please see attached instructions for diet.   ° °ACTIVITY: Your care partner should take you home directly after the procedure. You should plan to take it easy, moving slowly for the rest of the day. You can resume normal activity the day after the procedure however YOU SHOULD NOT DRIVE, use power tools, machinery or perform tasks that involve climbing or major physical exertion for 24 hours (because of the sedation medicines used during the test).  ° °SYMPTOMS TO REPORT IMMEDIATELY: °A gastroenterologist can be reached at any hour. Please call [phone]  for any of the following symptoms:  °Following lower endoscopy (colonoscopy, flexible sigmoidoscopy) °Excessive amounts of blood in the stool  °Significant tenderness, worsening of abdominal pains  °Swelling of the abdomen that is new, acute  °Fever of 100° or  higher  °Following upper endoscopy (EGD, EUS, ERCP, esophageal dilation) °Vomiting of blood or coffee ground material  °New, significant abdominal pain  °New, significant chest pain or pain under the shoulder blades  °Painful or persistently difficult swallowing  °New shortness of breath  °Black, tarry-looking or red, bloody stools ° °FOLLOW UP:  °If any biopsies were taken you will be contacted by phone or by letter within the next 1-3 weeks. Call [phone]  if you have not heard about the biopsies in 3 weeks.  °Please also call with any specific questions about appointments or follow up tests. ° °

## 2022-11-15 NOTE — Op Note (Signed)
[facility] [name] Procedure Date: 11/15/2022 MRN: [id number] Attending MD: Gerrit Heck , MD, [id number] Date of Birth: [birth year] CSN: [id number] Age: 82 Admit Type: Outpatient Procedure:                Colonoscopy w/ snare polypectomy Indications:              Heme positive stool, Iron deficiency anemia Providers:                Gerrit Heck, MD, Dulcy Fanny, William Dalton, Technician, Luciana Axe, CRNA Referring MD:             Noralyn Pick Medicines:                Monitored Anesthesia Care Complications:            No immediate complications. Estimated Blood Loss:     Estimated blood loss was minimal. Procedure:                Pre-Anesthesia Assessment:                           - Prior to the procedure, a History and Physical                            was performed, and patient medications and                            allergies were reviewed. The patient's tolerance of                            previous anesthesia was also reviewed. The risks                            and benefits of the procedure and the sedation                            options and risks were discussed with the patient.                            All questions were answered, and informed consent                            was obtained. Prior Anticoagulants: The patient has                            taken no anticoagulant or antiplatelet agents                            except for aspirin. ASA Grade Assessment: III - A                            patient with severe systemic disease. After  reviewing the risks and benefits, the patient was                            deemed in satisfactory condition to undergo the                            procedure.                           After obtaining informed consent, the colonoscope                            was passed under direct vision.  Throughout the                            procedure, the patient's blood pressure, pulse, and                            oxygen saturations were monitored continuously. The                            CF-HQ190L ([id number]) Olympus colonoscope was                            introduced through the anus and advanced to the 10                            cm into the ileum. The colonoscopy was performed                            without difficulty. The patient tolerated the                            procedure well. The quality of the bowel                            preparation was good. The terminal ileum, ileocecal                            valve, appendiceal orifice, and rectum were                            photographed. Scope In: 9:59:28 AM Scope Out: 10:26:34 AM Scope Withdrawal Time: 0 hours 18 minutes 46 seconds  Total Procedure Duration: 0 hours 27 minutes 6 seconds  Findings:      Skin tags were found on perianal exam.      Eight sessile polyps were found in the transverse colon (6), ascending       colon (1), and cecum (1). The polyps were 3 to 6 mm in size. These       polyps were removed with a cold snare. Resection and retrieval were       complete. Estimated blood loss was minimal.      Three sessile polyps were found in the sigmoid colon and descending       colon. The  polyps were 2 to 5 mm in size. These polyps were removed with       a cold snare. Resection and retrieval were complete. Estimated blood       loss was minimal.      Non-bleeding internal hemorrhoids were found during retroflexion. The       hemorrhoids were small.      There was a small lipoma, in the transverse colon.      The mucosa was otherwise normal appearing throughout the colon. No areas       of active bleeding nor stigmata of recent occult bleeding noted on this       study.      The terminal ileum appeared normal. Impression:               - Perianal skin tags found on perianal exam.                            - Eight 3 to 6 mm polyps in the transverse colon,                            in the ascending colon and in the cecum, removed                            with a cold snare. Resected and retrieved.                           - Three 2 to 5 mm polyps in the sigmoid colon and                            in the descending colon, removed with a cold snare.                            Resected and retrieved.                           - Non-bleeding internal hemorrhoids.                           - Small lipoma in the transverse colon.                           - Normal mucosa in the entire examined colon.                           - The examined portion of the ileum was normal. Moderate Sedation:      Not Applicable - Patient had care per Anesthesia. Recommendation:           - Patient has a contact number available for                            emergencies. The signs and symptoms of potential                            delayed complications were discussed with the  patient. Return to normal activities tomorrow.                            Written discharge instructions were provided to the                            patient.                           - Resume previous diet.                           - Continue present medications.                           - Await pathology results.                           - Repeat colonoscopy for surveillance based on                            pathology results.                           - If pathology results from the upper endoscopy are                            unrevealing, will plan for further small bowel                            visualization with a video capsule endoscopy at                            appointment to be scheduled. Procedure Code(s):        --- Professional ---                           [phone], Colonoscopy, flexible; with removal of                            tumor(s), polyp(s), or other lesion(s)  by snare                            technique Diagnosis Code(s):        --- Professional ---                           D12.3, Benign neoplasm of transverse colon (hepatic                            flexure or splenic flexure)                           D12.2, Benign neoplasm of ascending colon                           D12.0, Benign neoplasm of cecum  D12.5, Benign neoplasm of sigmoid colon                           D12.4, Benign neoplasm of descending colon                           D17.5, Benign lipomatous neoplasm of                            intra-abdominal organs                           K64.8, Other hemorrhoids                           K64.4, Residual hemorrhoidal skin tags                           R19.5, Other fecal abnormalities                           D50.9, Iron deficiency anemia, unspecified CPT copyright 2022 American Medical Association. All rights reserved. The codes documented in this report are preliminary and upon coder review may  be revised to meet current compliance requirements. Gerrit Heck, MD 11/15/2022 10:36:59 AM Number of Addenda: 0

## 2022-11-15 NOTE — Interval H&P Note (Signed)
History and Physical Interval Note:  11/15/2022 8:42 AM  Tammy Ball  has presented today for surgery, with the diagnosis of anemia.  The various methods of treatment have been discussed with the patient and family. After consideration of risks, benefits and other options for treatment, the patient has consented to  Procedure(s): ESOPHAGOGASTRODUODENOSCOPY (EGD) WITH PROPOFOL (N/A) COLONOSCOPY WITH PROPOFOL (N/A) as a surgical intervention.  The patient's history has been reviewed, patient examined, no change in status, stable for surgery.  I have reviewed the patient's chart and labs.  Questions were answered to the patient's satisfaction.     Dominic Pea Aseel Uhde

## 2022-11-15 NOTE — Anesthesia Postprocedure Evaluation (Signed)
Anesthesia Post Note  Patient: Tammy Ball  Procedure(s) Performed: ESOPHAGOGASTRODUODENOSCOPY (EGD) WITH PROPOFOL COLONOSCOPY WITH PROPOFOL BIOPSY POLYPECTOMY     Patient location during evaluation: PACU Anesthesia Type: MAC Level of consciousness: awake and alert Pain management: pain level controlled Vital Signs Assessment: post-procedure vital signs reviewed and stable Respiratory status: spontaneous breathing, nonlabored ventilation, respiratory function stable and patient connected to nasal cannula oxygen Cardiovascular status: stable and blood pressure returned to baseline Postop Assessment: no apparent nausea or vomiting Anesthetic complications: no  No notable events documented.  Last Vitals:  Vitals:   11/15/22 1055 11/15/22 1100  BP:    Pulse: 82 83  Resp: (!) 22 (!) 28  Temp:    SpO2: 97% 96%    Last Pain:  Vitals:   11/15/22 1050  TempSrc:   PainSc: 0-No pain                 [name]

## 2022-11-15 NOTE — Transfer of Care (Signed)
Immediate Anesthesia Transfer of Care Note  Patient: Tammy Ball  Procedure(s) Performed: ESOPHAGOGASTRODUODENOSCOPY (EGD) WITH PROPOFOL COLONOSCOPY WITH PROPOFOL BIOPSY POLYPECTOMY  Patient Location: Endoscopy Unit  Anesthesia Type:MAC  Level of Consciousness: awake, oriented, and patient cooperative  Airway & Oxygen Therapy: Patient Spontanous Breathing and Patient connected to nasal cannula oxygen  Post-op Assessment: Report given to RN and Post -op Vital signs reviewed and stable  Post vital signs: Reviewed  Last Vitals:  Vitals Value Taken Time  BP    Temp    Pulse 82 11/15/22 1040  Resp    SpO2 96 % 11/15/22 1040  Vitals shown include unvalidated device data.  Last Pain:  Vitals:   11/15/22 0846  TempSrc: Temporal  PainSc: 3       Patients Stated Pain Goal: 5 (56/97/94 8016)  Complications: No notable events documented.

## 2022-11-15 NOTE — Anesthesia Procedure Notes (Signed)
Procedure Name: MAC Date/Time: 11/15/2022 9:44 AM  Performed by: Jenne Campus, CRNAPre-anesthesia Checklist: Patient identified, Emergency Drugs available, Suction available and Patient being monitored Oxygen Delivery Method: Simple face mask Placement Confirmation: positive ETCO2

## 2022-11-16 LAB — SURGICAL PATHOLOGY

## 2022-11-19 ENCOUNTER — Encounter (HOSPITAL_COMMUNITY): Payer: Self-pay | Admitting: Gastroenterology

## 2022-11-23 ENCOUNTER — Encounter: Payer: Self-pay | Admitting: Gastroenterology

## 2022-11-23 ENCOUNTER — Other Ambulatory Visit: Payer: Self-pay | Admitting: Gastroenterology

## 2022-11-23 ENCOUNTER — Ambulatory Visit (INDEPENDENT_AMBULATORY_CARE_PROVIDER_SITE_OTHER): Payer: Medicare Other | Admitting: Gastroenterology

## 2022-11-23 VITALS — BP 130/72 | HR 82 | Ht 63.5 in | Wt 174.8 lb

## 2022-11-23 DIAGNOSIS — D509 Iron deficiency anemia, unspecified: Secondary | ICD-10-CM

## 2022-11-23 DIAGNOSIS — Z860100 Personal history of colon polyps, unspecified: Secondary | ICD-10-CM

## 2022-11-23 DIAGNOSIS — K648 Other hemorrhoids: Secondary | ICD-10-CM

## 2022-11-23 DIAGNOSIS — D649 Anemia, unspecified: Secondary | ICD-10-CM

## 2022-11-23 DIAGNOSIS — K219 Gastro-esophageal reflux disease without esophagitis: Secondary | ICD-10-CM

## 2022-11-23 NOTE — Progress Notes (Signed)
Chief Complaint:    Iron deficiency anemia, procedure follow-up  GI History: 82 year old female with history of anxiety, depression, hypertension, hyperlipidemia, coronary artery disease s/p NSTEMI 05/2022 on ASA (Plavix discontinued 07/05/2022 secondary to black stools/+ FOBT), non-small cell lung cancer, CKD 3, COPD on home oxygen 2L, renal cel carcinoma s/p left nephrectomy 2012, GERD.  - 07/12/2022: Initial evaluation in GI clinic for evaluation of black stools, FOBT positive stool in the setting ASA/Plavix after NSTEMI in 06/09/2022.  Plavix was discontinued, but continued on ASA. Hgb 13.1 --> 11.9, ferritin 19, iron 75, sat 17.4%.  Endoscopic evaluation deferred until January due to recency of NSTEMI, and instead was managed conservatively. - 09/06/2022: Follow-up appointment in GI clinic. Hgb 11.0, ferritin 9, iron 75, sat 17.4%, but no longer passing any dark stools.  Was not taking oral iron.  GERD controlled with Protonix 40 mg daily - 09/13/2022: FOBT positive, H/H 10.8/32.8 - 11/15/2022: EGD/colonoscopy at [facility] largely unrevealing from a occult bleeding standpoint as outlined below.  11 subcentimeter tubular adenomas    Endoscopic Evaluation: - 1997: EGD (Dr. Collene Mares): Esophageal erosions - 2013: Colonoscopy (Dr. Collene Mares): Hyperplastic sigmoid polyp - 11/15/2022: EGD: Normal esophagus, minimal nonulcer gastritis.  Normal duodenum (path benign).  Plan for VCE - 11/15/2022: Colonoscopy: 8 subcentimeter polyps in the cecum through transverse colon (path: Tubular adenomas), 3 subcentimeter polyps in the left colon (path: Tubular adenomas), Small internal hemorrhoids, transverse colon lipoma.  Otherwise normal colon mucosa and normal TI.  Repeat in 3 years pending overall clinical health  HPI:     Patient is a 82 y.o. female presenting to the Gastroenterology Clinic for follow-up.  Was last seen in the office by Carl Best on 09/06/2022 and scheduled for EGD/colonoscopy completed  earlier this month at [facility] as outlined above.  Reviewed pathology results from EGD/colonoscopy with patient today.  Taking oral iron, and nearing the end of her 90-day supply.  Otherwise no GI symptoms.    Review of systems:     No chest pain, no SOB, no fevers, no urinary sx   Past Medical History:  Diagnosis Date   Anemia    was while doing chemo   Anxiety    Arthritis    Asthma    Chemotherapy-induced neuropathy ([place]) 11/01/2015   CHF (congestive heart failure) (HCC)    Claustrophobia    COPD (chronic obstructive pulmonary disease) ([place])    Depression    Encounter for antineoplastic chemotherapy 02/09/2016   GERD (gastroesophageal reflux disease)    Headache    prior to menopause   Heart murmur    History of echocardiogram    Echo 2/17: EF 16-10%, grade 1 diastolic dysfunction, mild MR, trivial pericardial effusion   History of nuclear stress test    Myoview 2/17: no ischemia or scar, EF 79%; low risk   History of radiation therapy 10/30/16-11/06/16   Hyperlipidemia    Hypertension    Insomnia 05/16/2016   Myocardial infarction ([place])    Non-small cell carcinoma of right lung, stage 2 ([place]) 10/03/2015   Oxygen deficiency    Pneumonia    Radiation 02/29/16-04/10/16   50.4 Gy to right central chest   Renal cell carcinoma ([place])    L nephrectomy  in 2012    [name], family medical history, social history, medications and allergies were all reviewed in Epic    Current Outpatient Medications  Medication Sig Dispense Refill   acetaminophen (TYLENOL) 500 MG tablet Take 500 mg by  mouth every 6 (six) hours as needed for headache or moderate pain (pain).     albuterol (PROVENTIL) (2.5 MG/3ML) 0.083% nebulizer solution Take 3 mLs (2.5 mg total) by nebulization every 6 (six) hours as needed for wheezing or shortness of breath. 75 mL 6   albuterol (VENTOLIN HFA) 108 (90 Base) MCG/ACT inhaler Inhale 1-2 puffs into the lungs every 6 (six) hours as needed  for wheezing or shortness of breath. 1 each 6   Ascorbic Acid (VITAMIN C) 1000 MG tablet Take 1,000 mg by mouth every morning.     aspirin EC 81 MG tablet Take 81 mg by mouth every morning.     atorvastatin (LIPITOR) 40 MG tablet TAKE 1 TABLET BY MOUTH EVERY DAY 90 tablet 3   Calcium Carbonate Antacid (TUMS PO) Take 1 tablet by mouth 4 (four) times daily as needed (heartburn).     Carboxymethylcellulose Sodium (ARTIFICIAL TEARS OP) Place 1 drop into both eyes daily as needed (dry eyes).     carvedilol (COREG) 25 MG tablet TAKE 1 TABLET BY MOUTH TWICE A DAY 180 tablet 2   cholecalciferol (VITAMIN D3) 25 MCG (1000 UNIT) tablet Take 1,000 Units by mouth daily.     diphenhydrAMINE (BENADRYL) 25 mg capsule Take 25 mg by mouth at bedtime as needed for allergies.     ferrous sulfate 325 (65 FE) MG tablet Take 1 tablet (325 mg total) by mouth daily with breakfast. 30 tablet 2   Guaifenesin (MUCINEX MAXIMUM STRENGTH) 1200 MG TB12 Take 1,200 mg by mouth every morning.     losartan (COZAAR) 100 MG tablet TAKE 1 TABLET BY MOUTH EVERY DAY (Patient taking differently: Take 50 mg by mouth 2 (two) times daily.) 90 tablet 1   Omega-3 Fatty Acids (FISH OIL) 1200 MG CAPS Take 1,200 mg by mouth every morning.     OXYGEN Inhale 2 L into the lungs as needed (to keep SATS at or above 90).     pantoprazole (PROTONIX) 40 MG tablet Take 40 mg by mouth 2 (two) times daily.     sodium chloride (OCEAN) 0.65 % SOLN nasal spray Place 1 spray into both nostrils at bedtime as needed for congestion.     traMADol (ULTRAM) 50 MG tablet Take 1 tablet (50 mg total) by mouth every 6 (six) hours as needed (BACK PAIN). 50 tablet 1   TRELEGY ELLIPTA 100-62.5-25 MCG/ACT AEPB INHALE 1 PUFF BY MOUTH EVERY DAY 180 each 3   No current facility-administered medications for this visit.    Physical Exam:     There were no vitals taken for this visit.  GENERAL:  Pleasant female in NAD, nasal cannula in place PSYCH: : Cooperative, normal  affect NEURO: Alert and oriented x 3   IMPRESSION and PLAN:    1) Iron deficiency anemia - EGD/colonoscopy completed 11/2022 and largely unrevealing from a occult bleeding standpoint - Will schedule VCE for further small bowel interrogation. Hold PO iron 7 days prior - Repeat CBC and iron panel later this month or early next month  2) GERD - Well-controlled on current therapy - EGD without erosive esophagitis - Continue antireflux lifestyle/dietary modifications  3) History of colon polyps - Tentative plan for repeat colonoscopy in 2027 for ongoing polyp surveillance, pending overall clinical health and patient wishes at that time (will be age 64 and with significant comorbidities)  4) Internal hemorrhoids - Can certainly serve as potential source of recurrent FOBT positive stools.  Otherwise largely asymptomatic - Continue conservative management -  Did discuss role/utility of hemorrhoid banding if more symptomatic in the future  5) CAD with history of NSTEMI 6) HTN - Continue follow-up in the Cardiology clinic  7) Non-small cell lung cancer - Follow-up with Dr. Shirline Frees later this month.  She is planning for labs at that time      The indications, risks, and benefits of Video Capsule Endoscopy were explained to the patient in detail. Risks include but are not limited to capsule retention requiring endoscopic or surgical retrieval (occurs in 1-2%), failure to reach cecum prior to capsule battery expiration (ie, incomplete study), or inadequate visualization (ie, non-diagnostic study). The patient verbalized understanding and wished to proceed. All questions answered, referred to scheduler. Further recommendations pending results of the exam.    Tammy Ball ,DO, FACG 11/23/2022, 8:21 AM

## 2022-11-23 NOTE — Patient Instructions (Signed)
CAPSULE ENDOSCOPY PATIENT INSTRUCTION Tammy Ball  Tammy Ball 1941/10/09 [id number]   11/27/22 Seven (7) days prior to capsule endoscopy stop taking iron supplements and carafate.  12/01/22 Two (2) days prior to capsule endoscopy stop taking aspirin or any arthritis drugs.    12/04/22 One (1) day prior to capsule endoscopy: Stop smoking. Eat a regular diet until 12:00 Noon. After 12:00 Noon take only the following: Black coffee  Jell-O (no fruit or red Jell-o) Water   Bouillon (chicken or beef) 7-Up   Cranberry Juice Tea   Kool-Aid Popsicle (not red) Sprite   Coke Ginger Ale  Pepsi Mountain Dew Gatorade  Nothing to eat or drink after midnight except medications with a sip of water.  12/04/22 Day of capsule endoscopy:  No medications for 2 hours prior to your test.  Please arrive at [facility]  3rd floor patient registration area by 8:30 am on: 12/04/22.   For any questions: Call [facility] at [phone] and ask to speak with one of the capsule endoscopy nurses.  YOU WILL NEED TO RETURN THE EQUIPMENT AT 4 PM ON THE DAY OF THE PROCEDURE.  PLEASE KEEP THIS IN MIND WHEN SCHEDULING.     Small Bowel Capsule Endoscopy  What you should know: Small Bowel capsule endoscopy is a procedure that takes pictures of the inside of your small intestine (bowel).  Your small bowel connects to your stomach on one end, and your large bowel (colon) on the other.  A capsule endoscopy is done by swallowing a pill size camera.  The capsule moves through your stomach and into your small bowel, where pictures are taken.   You may need a small bowel capsule endoscopy if you have symptoms, such as blood in your stool, chronic stomach pain, and diarrhea.  The pictures may show if you have growths, swelling, and bleeding area in you small bowel.  A capsule endoscopy may also show if diseases such as Crohn's or celiac disease are causing your symptoms.  Having a small bowel capsule endoscopy may  help you and your caregiver learn the cause of your symptoms.  Learning what is causing your symptoms allows you to receive needed treatment and prevent further problems. Risks: You may have stomach pain during your procedure.   The pictures taken by the capsule may not be clear.   The pictures may not show the cause of your symptoms.   You may need another endoscopy procedure.   The capsule may get trapped in your esophagus or intestines. You may need surgery or additional procedures to remove the capsule from your body.    Before your procedure: You will be instructed to stop certain prescription medications or over- the -counter medications prior to the procedure.   The day before your scheduled appointment you will need to be on a restricted diet and will need to drink a bowel prep that will clean out your bowels.   The day of the procedure: You may drive yourself to the procedure.   You will need to plan on 2 trips to the office on the day of the procedure. Morning: Plan to be at the office about 45 minutes. The morning of the procedure a sensor belt and recorder will be placed on you.  You will wear this for 8 hours.  (The sensor belt transfers pictures of your small bowel to the recorder.)   You will be given a pill-sized capsule endoscope to swallow.  Once you swallow the capsule it will  travel through your body the same way food does, constantly taking pictures along the way.  The capsule takes 2-3 pictures a second.   Once you have left the office you may go about your normal day with a few exceptions: You may not go near a MRI machine or a radio or television towers; You need to avoid other patients having capsule endoscopy; You will be given a written diet to follow for the day.  Afternoon: You will need to be return to the office at your designated time. The sensors belt will be removed You will need to be at the office about 15 minutes.        If you are age 80 or older,  your body mass index should be between 23-30. Your Body mass index is 30.48 kg/m. If this is out of the aforementioned range listed, please consider follow up with your Primary Care Provider.   __________________________________________________________  The [place] [facility] providers would like to encourage you to use [facility] to communicate with providers for non-urgent requests or questions.  Due to long hold times on the telephone, sending your provider a message by [facility] may be a faster and more efficient way to get a response.  Please allow 48 business hours for a response.  Please remember that this is for non-urgent requests.    Due to recent changes in healthcare laws, you may see the results of your imaging and laboratory studies on MyChart before your provider has had a chance to review them.  We understand that in some cases there may be results that are confusing or concerning to you. Not all laboratory results come back in the same time frame and the provider may be waiting for multiple results in order to interpret others.  Please give [place] 48 hours in order for your provider to thoroughly review all the results before contacting the office for clarification of your results.     Thank you for choosing me and [facility].  Vito Cirigliano, D.O.

## 2022-12-04 ENCOUNTER — Telehealth: Payer: Self-pay

## 2022-12-04 ENCOUNTER — Ambulatory Visit: Payer: Medicare Other | Admitting: Gastroenterology

## 2022-12-04 NOTE — Telephone Encounter (Signed)
Patient returned your call.

## 2022-12-04 NOTE — Telephone Encounter (Signed)
Left message on machine to call back  

## 2022-12-04 NOTE — Patient Instructions (Signed)
You may have clear liquids beginning at 10:30 am after ingesting the capsule.    You can have a light lunch at 12:30 pm; sandwich and half bowl of soup.  Return to the office at 4 pm to return the equipment.   Return to you normal diet at 5 pm.   Call [phone] and ask for Johm Pfannenstiel BSN RN if you have any questions.  You should pass the capsule in your stool 8-48 hours after ingestion. If you have not passed the capsule, after 72 hours, please contact the office at [phone].    

## 2022-12-04 NOTE — Telephone Encounter (Signed)
The pt appt for capsule was not able to be completed today. The previous study did not down load.  Pt needs to be called to reschedule

## 2022-12-04 NOTE — Progress Notes (Signed)
When trying to set the capsule recorder up the previous study did not down load.  The pt will need to be rescheduled.  I have spoken to the pt and her son and they agreed. I apologized for the inconvenience and will contact the pt later today to make the appt.

## 2022-12-05 NOTE — Telephone Encounter (Signed)
I spoke with the pt to reschedule her capsule endo. She prefers to wait until she sees oncology and will call back when she is ready to proceed.  FYI Dr Bryan Lemma

## 2022-12-12 ENCOUNTER — Inpatient Hospital Stay: Payer: Medicare Other

## 2022-12-13 ENCOUNTER — Other Ambulatory Visit: Payer: Self-pay

## 2022-12-13 DIAGNOSIS — C3491 Malignant neoplasm of unspecified part of right bronchus or lung: Secondary | ICD-10-CM

## 2022-12-14 ENCOUNTER — Inpatient Hospital Stay: Payer: Medicare Other | Attending: Internal Medicine

## 2022-12-14 ENCOUNTER — Ambulatory Visit (HOSPITAL_COMMUNITY)
Admission: RE | Admit: 2022-12-14 | Discharge: 2022-12-14 | Disposition: A | Discharge status: Home / Self Care | Payer: Medicare Other | Source: Ambulatory Visit | Attending: Physician Assistant | Admitting: Physician Assistant

## 2022-12-14 DIAGNOSIS — C3491 Malignant neoplasm of unspecified part of right bronchus or lung: Secondary | ICD-10-CM | POA: Diagnosis not present

## 2022-12-14 DIAGNOSIS — J439 Emphysema, unspecified: Secondary | ICD-10-CM | POA: Diagnosis not present

## 2022-12-14 DIAGNOSIS — J449 Chronic obstructive pulmonary disease, unspecified: Secondary | ICD-10-CM | POA: Insufficient documentation

## 2022-12-14 DIAGNOSIS — C7802 Secondary malignant neoplasm of left lung: Secondary | ICD-10-CM | POA: Insufficient documentation

## 2022-12-14 DIAGNOSIS — I10 Essential (primary) hypertension: Secondary | ICD-10-CM | POA: Insufficient documentation

## 2022-12-14 DIAGNOSIS — Z85528 Personal history of other malignant neoplasm of kidney: Secondary | ICD-10-CM | POA: Insufficient documentation

## 2022-12-14 DIAGNOSIS — Z7982 Long term (current) use of aspirin: Secondary | ICD-10-CM | POA: Insufficient documentation

## 2022-12-14 DIAGNOSIS — Z79899 Other long term (current) drug therapy: Secondary | ICD-10-CM | POA: Insufficient documentation

## 2022-12-14 DIAGNOSIS — E785 Hyperlipidemia, unspecified: Secondary | ICD-10-CM | POA: Insufficient documentation

## 2022-12-14 DIAGNOSIS — C3490 Malignant neoplasm of unspecified part of unspecified bronchus or lung: Secondary | ICD-10-CM | POA: Diagnosis not present

## 2022-12-14 DIAGNOSIS — I700 Atherosclerosis of aorta: Secondary | ICD-10-CM | POA: Diagnosis not present

## 2022-12-14 DIAGNOSIS — C3411 Malignant neoplasm of upper lobe, right bronchus or lung: Secondary | ICD-10-CM | POA: Insufficient documentation

## 2022-12-14 LAB — CBC WITH DIFFERENTIAL/PLATELET
Abs Immature Granulocytes: 0.02 10*3/uL (ref 0.00–0.07)
Basophils Absolute: 0.1 10*3/uL (ref 0.0–0.1)
Basophils Relative: 1 %
Eosinophils Absolute: 0.2 10*3/uL (ref 0.0–0.5)
Eosinophils Relative: 4 %
HCT: 36.7 % (ref 36.0–46.0)
Hemoglobin: 11.7 g/dL — ABNORMAL LOW (ref 12.0–15.0)
Immature Granulocytes: 0 %
Lymphocytes Relative: 25 %
Lymphs Abs: 1.6 10*3/uL (ref 0.7–4.0)
MCH: 31.5 pg (ref 26.0–34.0)
MCHC: 31.9 g/dL (ref 30.0–36.0)
MCV: 98.7 fL (ref 80.0–100.0)
Monocytes Absolute: 0.7 10*3/uL (ref 0.1–1.0)
Monocytes Relative: 10 %
Neutro Abs: 3.8 10*3/uL (ref 1.7–7.7)
Neutrophils Relative %: 60 %
Platelets: 219 10*3/uL (ref 150–400)
RBC: 3.72 MIL/uL — ABNORMAL LOW (ref 3.87–5.11)
RDW: 15.4 % (ref 11.5–15.5)
WBC: 6.4 10*3/uL (ref 4.0–10.5)
nRBC: 0 % (ref 0.0–0.2)

## 2022-12-14 LAB — COMPREHENSIVE METABOLIC PANEL
ALT: 13 U/L (ref 0–44)
AST: 16 U/L (ref 15–41)
Albumin: 3.9 g/dL (ref 3.5–5.0)
Alkaline Phosphatase: 70 U/L (ref 38–126)
Anion gap: 4 — ABNORMAL LOW (ref 5–15)
BUN: 21 mg/dL (ref 8–23)
CO2: 34 mmol/L — ABNORMAL HIGH (ref 22–32)
Calcium: 9.7 mg/dL (ref 8.9–10.3)
Chloride: 101 mmol/L (ref 98–111)
Creatinine, Ser: 0.84 mg/dL (ref 0.44–1.00)
GFR, Estimated: >60 mL/min (ref >60)
Glucose, Bld: 97 mg/dL (ref 70–99)
Potassium: 4.7 mmol/L (ref 3.5–5.1)
Sodium: 139 mmol/L (ref 135–145)
Total Bilirubin: 0.4 mg/dL (ref 0.3–1.2)
Total Protein: 6.2 g/dL — ABNORMAL LOW (ref 6.5–8.1)

## 2022-12-18 ENCOUNTER — Inpatient Hospital Stay (HOSPITAL_BASED_OUTPATIENT_CLINIC_OR_DEPARTMENT_OTHER): Payer: Medicare Other | Admitting: Internal Medicine

## 2022-12-18 ENCOUNTER — Encounter: Payer: Self-pay | Admitting: Internal Medicine

## 2022-12-18 VITALS — BP 145/81 | HR 62 | Temp 98.5°F | Resp 16 | Wt 170.2 lb

## 2022-12-18 DIAGNOSIS — Z79899 Other long term (current) drug therapy: Secondary | ICD-10-CM | POA: Diagnosis not present

## 2022-12-18 DIAGNOSIS — J449 Chronic obstructive pulmonary disease, unspecified: Secondary | ICD-10-CM | POA: Diagnosis not present

## 2022-12-18 DIAGNOSIS — C7802 Secondary malignant neoplasm of left lung: Secondary | ICD-10-CM | POA: Diagnosis not present

## 2022-12-18 DIAGNOSIS — C3490 Malignant neoplasm of unspecified part of unspecified bronchus or lung: Secondary | ICD-10-CM | POA: Diagnosis not present

## 2022-12-18 DIAGNOSIS — Z7982 Long term (current) use of aspirin: Secondary | ICD-10-CM | POA: Diagnosis not present

## 2022-12-18 DIAGNOSIS — I10 Essential (primary) hypertension: Secondary | ICD-10-CM | POA: Diagnosis not present

## 2022-12-18 DIAGNOSIS — C3411 Malignant neoplasm of upper lobe, right bronchus or lung: Secondary | ICD-10-CM | POA: Diagnosis not present

## 2022-12-18 DIAGNOSIS — E785 Hyperlipidemia, unspecified: Secondary | ICD-10-CM | POA: Diagnosis not present

## 2022-12-18 DIAGNOSIS — Z85528 Personal history of other malignant neoplasm of kidney: Secondary | ICD-10-CM | POA: Diagnosis not present

## 2022-12-18 NOTE — Progress Notes (Signed)
[place] [phone]   [phone]  OFFICE PROGRESS NOTE  Horald Pollen, MD [place] [address]  DIAGNOSIS: Stage IIIA (T2a, N2, M0) non-small cell lung cancer, squamous cell carcinoma presented with large right upper lobe lung mass with right hilar involvement diagnosed in November 2016.  PRIOR THERAPY:  1) Neoadjuvant systemic chemotherapy with carboplatin for AUC of 6 and paclitaxel 200 MG/M2 every 3 weeks, status post 3 cycles with partial response. 2) Video bronchoscopy, right video-assisted thoracoscopy, minithoracotomy, right upper lobectomy with lymph node dissection on 01/18/2016. The final pathology revealed residual squamous cell carcinoma measuring 3.1 cm with close resection margin and metastatic carcinoma to lymph nodes at levels 10R, 12R and 4R. (ypT2a, yN2).  3) Concurrent chemoradiation with weekly carboplatin for AUC of 2 and paclitaxel 45 MG/M2. First dose 02/27/2016. Status post 5 cycles. Last dose was given 03/26/2016. 4) SBRT to left lower lobe lung nodule under the care of Dr. Sondra Come completed on 11/06/2016.  CURRENT THERAPY: Observation.  INTERVAL HISTORY: Tammy Ball 82 y.o. female returns to the clinic today for follow-up visit.  The patient is feeling fine today with no concerning complaints except for the baseline shortness of breath and she is currently on home oxygen.  She also has thick mucus.  She denied having any chest pain, cough or hemoptysis.  She has no nausea, vomiting, diarrhea or constipation.  She has no headache or visual changes.  She is here today for evaluation with repeat CT scan of the chest for restaging of her disease.  MEDICAL HISTORY: Past Medical History:  Diagnosis Date   Anemia    was while doing chemo   Anxiety    Asthma    Chemotherapy-induced neuropathy ([place]) 11/01/2015   Claustrophobia    COPD (chronic obstructive pulmonary disease) ([place])    Depression     Encounter for antineoplastic chemotherapy 02/09/2016   Glaucoma    Headache    prior to menopause   Heart murmur    History of echocardiogram    Echo 2/17: EF 71-24%, grade 1 diastolic dysfunction, mild MR, trivial pericardial effusion   History of nuclear stress test    Myoview 2/17: no ischemia or scar, EF 79%; low risk   History of radiation therapy 10/30/16-11/06/16   Hyperlipidemia    Hypertension    Insomnia 05/16/2016   Non-small cell carcinoma of right lung, stage 2 ([place]) 10/03/2015   Radiation 02/29/16-04/10/16   50.4 Gy to right central chest   Renal cell carcinoma ([place])    L nephrectomy  in 2012    ALLERGIES:  is allergic to bee venom, amlodipine, levofloxacin, alendronate, and hctz [hydrochlorothiazide].  MEDICATIONS:  Current Outpatient Medications  Medication Sig Dispense Refill   acetaminophen (TYLENOL) 500 MG tablet Take 500 mg by mouth every 6 (six) hours as needed for mild pain, moderate pain, fever or headache. Reported on 05/24/2016     albuterol (PROVENTIL) (2.5 MG/3ML) 0.083% nebulizer solution Take 3 mLs (2.5 mg total) by nebulization every 6 (six) hours as needed for wheezing or shortness of breath. 75 mL 6   albuterol (VENTOLIN HFA) 108 (90 Base) MCG/ACT inhaler Inhale 1-2 puffs into the lungs every 6 (six) hours as needed for wheezing or shortness of breath. 1 each 6   Ascorbic Acid (VITAMIN C) 1000 MG tablet Take 1,000 mg by mouth daily.     aspirin EC 81 MG tablet Take 81 mg by mouth daily.  atorvastatin (LIPITOR) 40 MG tablet Take 1 tablet (40 mg total) by mouth daily. 90 tablet 3   calcium carbonate (TUMS EX) 750 MG chewable tablet Chew 1 tablet by mouth daily as needed for heartburn.     Carboxymethylcellulose Sodium (ARTIFICIAL TEARS OP) Place 1 drop into both eyes daily as needed (dry eyes).     Cholecalciferol (VITAMIN D3 PO) Take 1,000 Units by mouth daily.     Guaifenesin (MUCINEX MAXIMUM STRENGTH) 1200 MG TB12 Take 1,200 mg by mouth 2 (two) times  daily.     losartan (COZAAR) 100 MG tablet TAKE 1 TABLET BY MOUTH EVERY DAY 90 tablet 1   OMEGA-3 FATTY ACIDS PO Take 1,200 mg by mouth daily.     OXYGEN Inhale 2 L into the lungs continuous.     sodium chloride (OCEAN) 0.65 % SOLN nasal spray Place 1 spray into both nostrils daily.     traMADol (ULTRAM) 50 MG tablet Take 1 tablet (50 mg total) by mouth every 6 (six) hours as needed (BACK PAIN). 50 tablet 1   TRELEGY ELLIPTA 100-62.5-25 MCG/INH AEPB INHALE 1 PUFF BY MOUTH EVERY DAY 180 each 3   carvedilol (COREG) 12.5 MG tablet Take 1 tablet (12.5 mg total) by mouth 2 (two) times daily. 180 tablet 3   No current facility-administered medications for this visit.    SURGICAL HISTORY:  Past Surgical History:  Procedure Laterality Date   BACK SURGERY     cervical 1991   EYE SURGERY     IR GENERIC HISTORICAL  01/16/2017   IR RADIOLOGIST EVAL & MGMT 01/16/2017 [facility]   IR GENERIC HISTORICAL  01/31/2017   IR FLUORO GUIDED NEEDLE PLC ASPIRATION/INJECTION LOC 01/31/2017 Luanne Bras, MD [facility]   IR GENERIC HISTORICAL  01/31/2017   IR VERTEBROPLASTY CERV/THOR BX INC UNI/BIL INC/INJECT/IMAGING 01/31/2017 Luanne Bras, MD [facility]   IR GENERIC HISTORICAL  01/31/2017   IR VERTEBROPLASTY EA ADDL (T&LS) BX INC UNI/BIL INC INJECT/IMAGING 01/31/2017 Luanne Bras, MD [facility]   kidney cancer     NEPHRECTOMY     SPINE SURGERY     TUBAL LIGATION     VIDEO ASSISTED THORACOSCOPY (VATS)/WEDGE RESECTION Right 01/18/2016   Procedure: VIDEO ASSISTED THORACOSCOPY (VATS)/LUNG RESECTION, THOROCOTOMY, RIGHT UPPER LOBECTOMY, LYMPH NODE DISSECTION, PLACEMENT OF ON Q;  Surgeon: [name], MD;  Location: [facility];  Service: Thoracic;  Laterality: Right;   VIDEO BRONCHOSCOPY Bilateral 09/20/2015   Procedure: VIDEO BRONCHOSCOPY WITHOUT FLUORO;  Surgeon: Rigoberto Noel, MD;  Location: [facility];  Service: Cardiopulmonary;  Laterality: Bilateral;   VIDEO BRONCHOSCOPY N/A 01/18/2016    Procedure: VIDEO BRONCHOSCOPY;  Surgeon: [name], MD;  Location: [facility];  Service: Thoracic;  Laterality: N/A;    REVIEW OF SYSTEMS:  A comprehensive review of systems was negative except for: Respiratory: positive for dyspnea on exertion and sputum   PHYSICAL EXAMINATION: General appearance: alert, cooperative and no distress Head: Normocephalic, without obvious abnormality, atraumatic Neck: no adenopathy, no JVD, supple, symmetrical, trachea midline and thyroid not enlarged, symmetric, no tenderness/mass/nodules Lymph nodes: Cervical, supraclavicular, and axillary nodes normal. Resp: clear to auscultation bilaterally Back: symmetric, no curvature. ROM normal. No CVA tenderness. Cardio: regular rate and rhythm, S1, S2 normal, no murmur, click, rub or gallop GI: soft, non-tender; bowel sounds normal; no masses,  no organomegaly Extremities: extremities normal, atraumatic, no cyanosis or edema  ECOG PERFORMANCE STATUS: 1 - Symptomatic but completely ambulatory  Blood pressure (!) 153/87, pulse 82, temperature (!)  97.3 F (36.3 C), temperature source Tympanic, resp. rate 20, height 5\' 4"  (1.626 m), weight 183 lb 8 oz (83.2 kg), SpO2 90 %.  LABORATORY DATA: Lab Results  Component Value Date   WBC 6.3 12/08/2021   HGB 12.6 12/08/2021   HCT 39.8 12/08/2021   MCV 90.2 12/08/2021   PLT 357 12/08/2021      Chemistry      Component Value Date/Time   NA 138 12/08/2021 0901   NA 140 11/21/2018 1058   NA 140 11/11/2017 0939   K 4.3 12/08/2021 0901   K 4.6 11/11/2017 0939   CL 102 12/08/2021 0901   CO2 28 12/08/2021 0901   CO2 28 11/11/2017 0939   BUN 16 12/08/2021 0901   BUN 23 11/21/2018 1058   BUN 30.3 (H) 11/11/2017 0939   CREATININE 1.15 (H) 12/08/2021 0901   CREATININE 1.3 (H) 11/11/2017 0939   GLU 105 12/10/2009 0000      Component Value Date/Time   CALCIUM 9.3 12/08/2021 0901   CALCIUM 10.0 11/11/2017 0939   ALKPHOS 95 12/08/2021 0901   ALKPHOS 102 11/11/2017  0939   AST 17 12/08/2021 0901   AST 18 11/11/2017 0939   ALT 16 12/08/2021 0901   ALT 19 11/11/2017 0939   BILITOT 0.6 12/08/2021 0901   BILITOT 0.42 11/11/2017 0939       RADIOGRAPHIC STUDIES: CT Chest Wo Contrast  Result Date: 12/15/2022 CLINICAL DATA:  Non-small-cell lung cancer. Assess treatment response. History of breast cancer * Tracking Code: BO * EXAM: CT CHEST WITHOUT CONTRAST TECHNIQUE: Multidetector CT imaging of the chest was performed following the standard protocol without IV contrast. RADIATION DOSE REDUCTION: This exam was performed according to the departmental dose-optimization program which includes automated exposure control, adjustment of the mA and/or kV according to patient size and/or use of iterative reconstruction technique. COMPARISON:  CT angiogram 06/09/2022.  Older CT scans as well FINDINGS: Cardiovascular: Heart is not enlarged. Trace pericardial fluid or thickening. Coronary artery calcifications are seen. Normal caliber thoracic aorta with scattered vascular calcifications. There is some enlargement of the pulmonary artery centrally. Please correlate for any evidence of pulmonary artery hypertension Mediastinum/Nodes: Normal caliber thoracic esophagus. No specific abnormal lymph node enlargement present in the axillary region, hilum or mediastinum on this noncontrast examination. Lungs/Pleura: Centrilobular emphysematous lung changes are identified. There is some basilar scar atelectatic change. Surgical changes again seen along right hilum from lobectomy. Adjacent scarring atelectatic change along the right perihilar and mediastinal locations, likely posttreatment. There is some patchy opacity along the left lung base, increasing from prior. Addition there is a focal area of severe airway narrowing or occlusion along the left lower lobe centrally best seen on series 9, image 85, coronal imaging. Axial images series 8, image 76 thru 84. This is in the area of more  distal opacity of the lung which is increasing. This would have a differential. Both malignant and benign. Recommend further evaluation. Upper Abdomen: In the upper abdomen the adrenal glands are preserved. Musculoskeletal: Stable compression deformities along the upper thoracic spine, severely at T4, moderate at T3. There is augmentation cement these levels. Mild along the superior endplate of T6 stable. Osteopenia with degenerative changes and osteophytes. Fixation hardware along the lower cervical spine. IMPRESSION: 1. Surgical changes along the right hilum from prior lobectomy. Adjacent posttreatment changes. 2. There is a focal area of severe airway narrowing or occlusion along the left lower lobe centrally. This is in the area of more distal opacity  of the lung which is increasing. This would have a differential. Aggressive process is not as excluded. Recommend further evaluation. 3. Patchy opacity along the left lung base, increasing from prior. This could be related to the central bronchial abnormality associated with this area of lung 4. Enlargement of the pulmonary artery centrally. Please correlate for any evidence of pulmonary artery hypertension. 5. Stable compression deformities along the upper thoracic spine. 6. Aortic atherosclerosis. 7. Emphysema. Aortic Atherosclerosis (ICD10-I70.0) and Emphysema (ICD10-J43.9). A preliminary report will be called to the ordering service on the next business day as the office is closed today by the Radiology physician assistant team Electronically Signed   By: Jill Side M.D.   On: 12/15/2022 11:40     ASSESSMENT AND PLAN:  This is a very pleasant 82 years old white female with stage IIIA non-small cell lung cancer, squamous cell carcinoma status post neoadjuvant systemic chemotherapy was carboplatin and paclitaxel with partial response followed by right upper lobectomy and lymph node dissection. She was found to have evidence of metastatic disease to the  mediastinal lymph nodes and close resection margin. The patient underwent a course of concurrent chemoradiation with weekly carboplatin and paclitaxel. She was also treated with SBRT left lower lobe pulmonary nodule under the care of Dr. Sondra Come. The patient has been on observation for more than 5 years.   The patient is currently on observation and she is feeling fine with no concerning complaints. She had repeat CT scan of the chest performed recently.  I personally and independently reviewed the scan images and discussed the result with the patient today. Her scan showed no concerning findings for disease recurrence or metastasis but there was focal area of severe airway narrowing or occlusion along the left lower lobe centrally concerning for an aggressive process versus possibility for mucous plugging in that area.  I discussed the scan with the patient and she is scheduled to see Dr. Lamonte Sakai in few weeks.  She will check with him for any need of bronchoscopy to rule out any endobronchial lesion at that time. I will see her back for follow-up visit in 3 months for evaluation with repeat CT scan of the chest for close monitoring of this abnormality in the left lung. The patient was advised to call immediately if she has any other concerning symptoms in the interval.  The patient voices understanding of current disease status and treatment options and is in agreement with the current care plan. All questions were answered. The patient knows to call the clinic with any problems, questions or concerns. We can certainly see the patient much sooner if necessary.  Disclaimer: This note was dictated with voice recognition software. Similar sounding words can inadvertently be transcribed and may not be corrected upon review.

## 2022-12-24 DIAGNOSIS — Z961 Presence of intraocular lens: Secondary | ICD-10-CM | POA: Diagnosis not present

## 2022-12-24 DIAGNOSIS — H18593 Other hereditary corneal dystrophies, bilateral: Secondary | ICD-10-CM | POA: Diagnosis not present

## 2022-12-24 DIAGNOSIS — H5213 Myopia, bilateral: Secondary | ICD-10-CM | POA: Diagnosis not present

## 2022-12-24 DIAGNOSIS — H40013 Open angle with borderline findings, low risk, bilateral: Secondary | ICD-10-CM | POA: Diagnosis not present

## 2023-01-10 ENCOUNTER — Encounter: Payer: Self-pay | Admitting: Emergency Medicine

## 2023-01-10 ENCOUNTER — Ambulatory Visit (INDEPENDENT_AMBULATORY_CARE_PROVIDER_SITE_OTHER): Payer: Medicare Other | Admitting: Emergency Medicine

## 2023-01-10 VITALS — BP 126/74 | HR 59 | Temp 98.4°F | Ht 63.5 in | Wt 173.4 lb

## 2023-01-10 DIAGNOSIS — J449 Chronic obstructive pulmonary disease, unspecified: Secondary | ICD-10-CM

## 2023-01-10 DIAGNOSIS — J209 Acute bronchitis, unspecified: Secondary | ICD-10-CM | POA: Diagnosis not present

## 2023-01-10 DIAGNOSIS — J440 Chronic obstructive pulmonary disease with acute lower respiratory infection: Secondary | ICD-10-CM

## 2023-01-10 DIAGNOSIS — C3491 Malignant neoplasm of unspecified part of right bronchus or lung: Secondary | ICD-10-CM

## 2023-01-10 DIAGNOSIS — J301 Allergic rhinitis due to pollen: Secondary | ICD-10-CM

## 2023-01-10 DIAGNOSIS — J9611 Chronic respiratory failure with hypoxia: Secondary | ICD-10-CM | POA: Diagnosis not present

## 2023-01-10 MED ORDER — AZITHROMYCIN 250 MG PO TABS: ORAL_TABLET | ORAL | 2 refills | Status: DC

## 2023-01-10 MED ORDER — BREZTRI AEROSPHERE 160-9-4.8 MCG/ACT IN AERO: 2.0000 | INHALATION_SPRAY | Freq: Two times a day (BID) | RESPIRATORY_TRACT | 0 refills | Status: DC

## 2023-01-10 NOTE — Progress Notes (Signed)
Subjective:    Patient ID: EARNIE ARNE, female    DOB: 02-May-1941, 82 y.o.   MRN: [id number]  HPI  ROV 01/09/22 --follow-up visit for 82 year old woman with a history of COPD, squamous cell lung cancer 2016 for which she underwent right upper lobe lobectomy and chemoradiation, then SBRT to a left lower lobe pulmonary nodule 10/2016.  Past medical history also significant for hypertension with diastolic dysfunction, renal cell carcinoma. She is been managed on Trelegy, oxygen at 2 L/min.  She reports that she has had 2 flares since last year - treated after URI's, received abx without steroids. She feels close to baseline - still has exertional SOB, uses her O2 w exertion and at night. Her cough is better from recent AE. Some wheeze at night. Using albuterol only when flaring - otherwise rarely.  She is able to shop, do housework. She is still working, documentation review.   CT chest 12/08/2021 reviewed by me shows no evidence of recurrence.  There was pulmonary arterial enlargement suggestive of PAH.  Echocardiogram 01/09/2016 with normal LV size and function, LVEF 60-65% with grade 1 diastolic dysfunction.  Normal RV size and function.   ROV 01/10/23 --82 year old woman with a history of COPD and right upper lobe squamous cell lung cancer that was treated with right upper lobe lobectomy, chemoradiation 2016.  She then underwent SBRT to a left lower lobe pulmonary nodule 10/2016.  PMH also significant for CAD, hypertension with diastolic dysfunction, renal cell carcinoma. Since I last saw her she was hospitalized for non-STEMI She is having more cough with mucous production. She has wheeze, SOB w exertion. She has been on O2 since her hospitalization in July. She has been on Trelegy, formulary changed. She needs to change to [facility]  CT chest 12/14/2022 reviewed by me, shows no hilar or mediastinal adenopathy, centrilobular emphysema, basilar atelectasis and surgical changes along the right hilum  from lobectomy.  There are some patchy opacity left lung base slightly increased from prior with some focal left lower lobe central airway narrowing   Review of Systems  Constitutional:  Negative for fever and unexpected weight change.  HENT:  Negative for congestion, dental problem, ear pain, nosebleeds, postnasal drip, rhinorrhea, sinus pressure, sneezing, sore throat and trouble swallowing.   Eyes:  Negative for redness and itching.  Respiratory:  Positive for shortness of breath and wheezing. Negative for cough and chest tightness.   Cardiovascular:  Negative for palpitations and leg swelling.  Gastrointestinal:  Negative for nausea and vomiting.  Genitourinary:  Negative for dysuria.  Musculoskeletal:  Negative for joint swelling.  Skin:  Negative for rash.  Allergic/Immunologic: Negative.  Negative for environmental allergies, food allergies and immunocompromised state.  Neurological:  Negative for headaches.  Hematological:  Does not bruise/bleed easily.  Psychiatric/Behavioral:  Negative for dysphoric mood. The patient is nervous/anxious.      Objective:   Physical Exam Vitals:   01/10/23 0837  BP: 126/74  Pulse: (!) 59  Temp: 98.4 F (36.9 C)  TempSrc: Oral  SpO2: 96%  Weight: 173 lb 6.4 oz (78.7 kg)  Height: 5' 3.5" (1.613 m)   Gen: Pleasant, elderly woman, in no distress,  normal affect  ENT: No lesions,  mouth clear,  oropharynx clear, no postnasal drip  Neck: No JVD, no stridor  Lungs: No use of accessory muscles, no crackles or wheezing on normal respiration, good air movement on forced exp as well  Cardiovascular: RRR, early systolic M  Musculoskeletal: No deformities, no  cyanosis or clubbing  Neuro: alert, awake, non focal  Skin: Warm, no lesions or rash      Assessment & Plan:  COPD GOLD B-C Plan change Trelegy to [facility] for formulary reasons.  Will also try to see if she qualifies for patient assistance.  No flares since I last saw her although  she does have symptoms consistent with an evolving acute bronchitis currently.  We will treat as below  Acute bronchitis with COPD ([place]) More sputum, more purulent sputum production for the last few weeks.  Will try to treat her rhinitis more effectively.  Give her azithromycin for simple acute bronchitis.  No wheezing today, will defer corticosteroids.  Allergic rhinitis She is using Flonase as needed, will try using this on a schedule for a few weeks to see if this helps with rhinitis, cough burden.  Chronic respiratory failure with hypoxia ([facility]) Continue oxygen at all times as ordered  Non-small cell carcinoma of right lung, stage 2 ([place]) Reviewed her CT chest with her today.  There is a slight increase in some scar associated with her left lower lobe SBRT site, possibly some increased airway thickening/narrowing.  I do not see anything that would necessitate bronchoscopy at this time.  Agree with short-term follow-up CT scan in 3 months as planned by Dr. Julien Nordmann.  Depending on that result we will determine whether airway inspection is warranted.   Baltazar Apo, MD, PhD 01/10/2023, 8:58 AM [facility] [phone] or if no answer [phone]

## 2023-01-10 NOTE — Patient Instructions (Signed)
We reviewed your CT scan of the chest today.  There is some slight increase in left lower lobe scar compared with your priors.  I do not think we need to move to bronchoscopy at this time.  I do want you to have your repeat CT scan of the chest in 3 months as planned by Dr. Julien Ball.  We will follow-up to review, decide whether bronchoscopy would be helpful Stop Trelegy, we will start Breztri 2 puffs twice a day.  Rinse and gargle after using. Keep your albuterol available to use either 1 nebulizer treatment or 2 puffs if needed for shortness of breath, chest tightness, wheezing. Please take azithromycin as directed until completely gone Continue oxygen at all times as you have been using it Try using your Flonase nasal spray, 2 sprays each nostril once daily as a maintenance medication for a few weeks to see if this helps with nasal congestion, drainage and therefore cough Follow with Dr Tammy Ball in 3 months or sooner if you have any problems.

## 2023-01-10 NOTE — Addendum Note (Signed)
Addended by: Dierdre Highman on: 01/10/2023 09:11 AM   Modules accepted: Orders

## 2023-01-10 NOTE — Assessment & Plan Note (Signed)
Continue oxygen at all times as ordered

## 2023-01-10 NOTE — Assessment & Plan Note (Signed)
More sputum, more purulent sputum production for the last few weeks.  Will try to treat her rhinitis more effectively.  Give her azithromycin for simple acute bronchitis.  No wheezing today, will defer corticosteroids.

## 2023-01-10 NOTE — Assessment & Plan Note (Signed)
Plan change Trelegy to [facility] for formulary reasons.  Will also try to see if she qualifies for patient assistance.  No flares since I last saw her although she does have symptoms consistent with an evolving acute bronchitis currently.  We will treat as below

## 2023-01-10 NOTE — Assessment & Plan Note (Signed)
She is using Flonase as needed, will try using this on a schedule for a few weeks to see if this helps with rhinitis, cough burden.

## 2023-01-10 NOTE — Assessment & Plan Note (Signed)
Reviewed her CT chest with her today.  There is a slight increase in some scar associated with her left lower lobe SBRT site, possibly some increased airway thickening/narrowing.  I do not see anything that would necessitate bronchoscopy at this time.  Agree with short-term follow-up CT scan in 3 months as planned by Dr. Julien Nordmann.  Depending on that result we will determine whether airway inspection is warranted.

## 2023-01-11 ENCOUNTER — Encounter: Payer: Self-pay | Admitting: Internal Medicine

## 2023-01-11 ENCOUNTER — Ambulatory Visit: Payer: Medicare Other | Attending: Internal Medicine | Admitting: Internal Medicine

## 2023-01-11 VITALS — BP 130/64 | HR 57 | Ht 63.5 in | Wt 175.0 lb

## 2023-01-11 DIAGNOSIS — I10 Essential (primary) hypertension: Secondary | ICD-10-CM | POA: Diagnosis not present

## 2023-01-11 DIAGNOSIS — I214 Non-ST elevation (NSTEMI) myocardial infarction: Secondary | ICD-10-CM | POA: Diagnosis not present

## 2023-01-11 NOTE — Progress Notes (Signed)
Cardiology Office Note:    Date:  01/11/2023   ID:  CHLO… BLEAK, DOB 03/03/1941, MRN [id number]  PCP:  Horald Pollen, [place] [facility] Cardiologist:  Janina Mayo, MD     Referring MD: Horald Pollen, *   No chief complaint on file. Hospital FU  History of Present Illness:    Tammy Ball is a 82 y.o. female with a hx of Stage IIIA NSCLC, SCC RUL, COPD on home O2. She presented to the hospital 06/09/2022 with N/V. EKG showed progressive inferior; anterolateral TWI. Troponin was elevated to the 600s. She was medically managed for NSTEMI. We discussed LHC in the hospital. She was apprehensive. Considering her low risk ACS presentation, opted to continue with medical management. She was started on DAPT. Echo showed normal LV function. No valve dx.   Today, she reports stomach pain. She saw her PCP Dr. [name]. She had abd [place] that did not show signs of cholecystitis. LFTs are normal   Stool is dark black which she reported in the hospital. Hgb is normal. She denies chest pressure. Has some fatigue. She's on chronic O2.  Blood pressures 1teens at home.   Interim Hx 01/11/2023 She was guaiac + EGD/colonoscopy at [facility] largely unrevealing from a occult bleeding. Still having dark tarry stools. Before she started the iron.  She attempted a capsule endoscopy, and it did not work. Recently saw Dr. Lamonte Sakai , no plans for bronch for increased sputum. She is being managed with azithromycin.   Denies CP. SOB is chronic on O2 with COPD. No LE edema. No PND/orthopnea. Weight is stable  Wt Readings from Last 3 Encounters:  01/11/23 175 lb (79.4 kg)  01/10/23 173 lb 6.4 oz (78.7 kg)  12/18/22 170 lb 3.2 oz (77.2 kg)     Past Medical History:  Diagnosis Date   Anemia    was while doing chemo   Anxiety    Arthritis    Asthma    Chemotherapy-induced neuropathy ([place]) 11/01/2015   CHF (congestive heart failure) (HCC)    Claustrophobia    COPD (chronic  obstructive pulmonary disease) ([place])    Depression    Encounter for antineoplastic chemotherapy 02/09/2016   GERD (gastroesophageal reflux disease)    Headache    prior to menopause   Heart murmur    History of echocardiogram    Echo 2/17: EF 123456, grade 1 diastolic dysfunction, mild MR, trivial pericardial effusion   History of nuclear stress test    Myoview 2/17: no ischemia or scar, EF 79%; low risk   History of radiation therapy 10/30/16-11/06/16   Hyperlipidemia    Hypertension    Insomnia 05/16/2016   Myocardial infarction ([place])    Non-small cell carcinoma of right lung, stage 2 ([place]) 10/03/2015   Oxygen deficiency    Pneumonia    Radiation 02/29/16-04/10/16   50.4 Gy to right central chest   Renal cell carcinoma ([place])    L nephrectomy  in 2012    Past Surgical History:  Procedure Laterality Date   BACK SURGERY     cervical 1991   BIOPSY  11/15/2022   Procedure: BIOPSY;  Surgeon: Lavena Bullion, DO;  Location: [facility];  Service: Gastroenterology;;   COLONOSCOPY WITH PROPOFOL N/A 11/15/2022   Procedure: COLONOSCOPY WITH PROPOFOL;  Surgeon: Lavena Bullion, DO;  Location: [facility];  Service: Gastroenterology;  Laterality: N/A;   ESOPHAGOGASTRODUODENOSCOPY (EGD) WITH PROPOFOL N/A 11/15/2022   Procedure: ESOPHAGOGASTRODUODENOSCOPY (  EGD) WITH PROPOFOL;  Surgeon: Lavena Bullion, DO;  Location: [facility];  Service: Gastroenterology;  Laterality: N/A;   EYE SURGERY     IR GENERIC HISTORICAL  01/16/2017   IR RADIOLOGIST EVAL & MGMT 01/16/2017 [facility]   IR GENERIC HISTORICAL  01/31/2017   IR FLUORO GUIDED NEEDLE PLC ASPIRATION/INJECTION LOC 01/31/2017 Luanne Bras, MD [facility]   IR GENERIC HISTORICAL  01/31/2017   IR VERTEBROPLASTY CERV/THOR BX INC UNI/BIL INC/INJECT/IMAGING 01/31/2017 Luanne Bras, MD [facility]   IR GENERIC HISTORICAL  01/31/2017   IR VERTEBROPLASTY EA ADDL (T&LS) BX INC UNI/BIL INC INJECT/IMAGING 01/31/2017 Luanne Bras, MD [facility]   kidney cancer     LOBECTOMY Right    RUL   NEPHRECTOMY  2012   POLYPECTOMY  11/15/2022   Procedure: POLYPECTOMY;  Surgeon: Lavena Bullion, DO;  Location: [facility];  Service: Gastroenterology;;   SPINE SURGERY     TUBAL LIGATION     VIDEO ASSISTED THORACOSCOPY (VATS)/WEDGE RESECTION Right 01/18/2016   Procedure: VIDEO ASSISTED THORACOSCOPY (VATS)/LUNG RESECTION, THOROCOTOMY, RIGHT UPPER LOBECTOMY, LYMPH NODE DISSECTION, PLACEMENT OF ON Q;  Surgeon: [name], MD;  Location: [place];  Service: Thoracic;  Laterality: Right;   VIDEO BRONCHOSCOPY Bilateral 09/20/2015   Procedure: VIDEO BRONCHOSCOPY WITHOUT FLUORO;  Surgeon: Rigoberto Noel, MD;  [facility];  Service: Cardiopulmonary;  Laterality: Bilateral;   VIDEO BRONCHOSCOPY N/A 01/18/2016   Procedure: VIDEO BRONCHOSCOPY;  Surgeon: [name], MD;  Location[facility];  Service: Thoracic;  Laterality: N/A;    Current Medications: Current Meds  Medication Sig   acetaminophen (TYLENOL) 500 MG tablet Take 500 mg by mouth every 6 (six) hours as needed for headache or moderate pain (pain).   albuterol (PROVENTIL) (2.5 MG/3ML) 0.083% nebulizer solution Take 3 mLs (2.5 mg total) by nebulization every 6 (six) hours as needed for wheezing or shortness of breath.   albuterol (VENTOLIN HFA) 108 (90 Base) MCG/ACT inhaler Inhale 1-2 puffs into the lungs every 6 (six) hours as needed for wheezing or shortness of breath.   Ascorbic Acid (VITAMIN C) 1000 MG tablet Take 1,000 mg by mouth every morning.   aspirin EC 81 MG tablet Take 81 mg by mouth every morning.   atorvastatin (LIPITOR) 40 MG tablet TAKE 1 TABLET BY MOUTH EVERY DAY   azithromycin (ZITHROMAX Z-PAK) 250 MG tablet Take 2 tabs today, then 1 tab until gone   Budeson-Glycopyrrol-Formoterol (BREZTRI AEROSPHERE) 160-9-4.8 MCG/ACT AERO Inhale 2 puffs into the lungs in the morning and at bedtime.   Calcium Carbonate Antacid (TUMS PO) Take 1  tablet by mouth 4 (four) times daily as needed (heartburn).   Carboxymethylcellulose Sodium (ARTIFICIAL TEARS OP) Place 1 drop into both eyes daily as needed (dry eyes).   carvedilol (COREG) 25 MG tablet TAKE 1 TABLET BY MOUTH TWICE A DAY   cholecalciferol (VITAMIN D3) 25 MCG (1000 UNIT) tablet Take 1,000 Units by mouth daily.   diphenhydrAMINE (BENADRYL) 25 mg capsule Take 25 mg by mouth at bedtime as needed for allergies.   ferrous sulfate 325 (65 FE) MG tablet TAKE 1 TABLET BY MOUTH EVERY DAY WITH BREAKFAST   Guaifenesin (MUCINEX MAXIMUM STRENGTH) 1200 MG TB12 Take 1,200 mg by mouth every morning.   losartan (COZAAR) 100 MG tablet TAKE 1 TABLET BY MOUTH EVERY DAY (Patient taking differently: Take 50 mg by mouth 2 (two) times daily.)   Omega-3 Fatty Acids (FISH OIL) 1200 MG CAPS Take 1,200 mg by mouth every  morning.   OXYGEN Inhale 2 L into the lungs as needed (to keep SATS at or above 90).   pantoprazole (PROTONIX) 40 MG tablet Take 40 mg by mouth 2 (two) times daily.   sodium chloride (OCEAN) 0.65 % SOLN nasal spray Place 1 spray into both nostrils at bedtime as needed for congestion.   traMADol (ULTRAM) 50 MG tablet Take 1 tablet (50 mg total) by mouth every 6 (six) hours as needed (BACK PAIN).     Allergies:   Bee venom, Amlodipine, Levofloxacin, Alendronate, and Hctz [hydrochlorothiazide]   Social History   Socioeconomic History   Marital status: Widowed    Spouse name: Not on file   Number of children: 4   Years of education: Not on file   Highest education level: High school graduate  Occupational History   Not on file  Tobacco Use   Smoking status: Former    Packs/day: 1.00    Years: 50.00    Total pack years: 50.00    Types: Cigarettes    Quit date: 03/16/2011    Years since quitting: 11.8   Smokeless tobacco: Never   Tobacco comments:    last 4-5 years of smoking, smoked 0.5 pack/day   Vaping Use   Vaping Use: Never used  Substance and Sexual Activity   Alcohol use:  No    Alcohol/week: 0.0 standard drinks of alcohol   Drug use: No   Sexual activity: Not Currently  Other Topics Concern   Not on file  Social History Narrative   Marital status: divorced; not dating in 2019.      Children: 4 children; 3 grandchildren adult; 4 gg.      Lives: alone in house      Employment: part-time work; substance abuse counselor; [employer].      Tobacco: quit smoking 2012; smoked 45 years      Alcohol: none      Drugs: none      Exercise: none in 2019; due to LEFT sciatica.      ADLs: independent with ADLs; drives.       Advanced Directives: YES: HCPOA: Nicholas Martinez/son.  FULL CODE but no prolonged measures.      Occupation: Substance Abuse Estate agent   No exercise** Merged History Encounter **       ** Data from: 12/14/11 Enc Dept: [facility]       ** Data from: 12/17/11 Enc Dept: [facility]   Substance abuse counselor   Husband deceased   4 great grandchildren   Son works in same substance abuse counseling center as patient   Social Determinants of Radio broadcast assistant Strain: Low Risk  (05/28/2022)   Overall Financial Resource Strain (CARDIA)    Difficulty of Paying Living Expenses: Not hard at all  Food Insecurity: No Food Insecurity (05/28/2022)   Hunger Vital Sign    Worried About Running Out of Food in the Last Year: Never true    Ran Out of Food in the Last Year: Never true  Transportation Needs: No Transportation Needs (05/28/2022)   PRAPARE - Hydrologist (Medical): No    Lack of Transportation (Non-Medical): No  Physical Activity: Insufficiently Active (05/28/2022)   Exercise Vital Sign    Days of Exercise per Week: 7 days    Minutes of Exercise per Session: 20 min  Stress: No Stress Concern Present (05/28/2022)   [place]  Institute of Occupational Health - Occupational Stress Questionnaire    Feeling of Stress : Not at all  Social Connections:  Moderately Integrated (05/18/2021)   Social Connection and Isolation Panel [NHANES]    Frequency of Communication with Friends and Family: More than three times a week    Frequency of Social Gatherings with Friends and Family: More than three times a week    Attends Religious Services: More than 4 times per year    Active Member of [employer] or Organizations: No    Attends Music therapist: More than 4 times per year    Marital Status: Divorced     Family History: The patient's family history includes Birth defects in her sister; Breast cancer in her sister; CAD (age of onset: 21) in her mother; Cancer in her maternal grandmother and mother; Glaucoma in her daughter; Heart attack (age of onset: 41) in her daughter; Heart disease in her sister; Hypertension in her mother; Obesity in her brother. There is no history of Colon cancer, Colon polyps, Esophageal cancer, Rectal cancer, or Stomach cancer.  ROS:   Please see the history of present illness.     All other systems reviewed and are negative.  EKGs/Labs/Other Studies Reviewed:    The following studies were reviewed today:   EKG:  EKG is  ordered today.  The ekg ordered today demonstrates   06/28/2022-NSR, inferior Q, poor r wave progression c/f anterior infarct  Recent Labs: 06/11/2022: Magnesium 2.0 12/14/2022: ALT 13; BUN 21; Creatinine, Ser 0.84; Hemoglobin 11.7; Platelets 219; Potassium 4.7; Sodium 139  Recent Lipid Panel    Component Value Date/Time   CHOL 131 06/10/2022 0243   CHOL 163 11/21/2018 1058   TRIG 62 06/10/2022 0243   HDL 52 06/10/2022 0243   HDL 87 11/21/2018 1058   CHOLHDL 2.5 06/10/2022 0243   VLDL 12 06/10/2022 0243   LDLCALC 67 06/10/2022 0243   LDLCALC 61 11/21/2018 1058     Risk Assessment/Calculations:           Physical Exam:    VS:   Vitals:   01/11/23 0815  BP: 130/64  Pulse: (!) 57  SpO2: 94%    Wt Readings from Last 3 Encounters:  01/11/23 175 lb (79.4 kg)  01/10/23 173  lb 6.4 oz (78.7 kg)  12/18/22 170 lb 3.2 oz (77.2 kg)     GEN:  Well nourished, well developed in no acute distress HEENT: Normal NECK: No JVD;  LYMPHATICS: No lymphadenopathy CARDIAC: RRR, no murmurs, rubs, gallops RESPIRATORY:  Clear to auscultation without rales, wheezing or rhonchi  ABDOMEN: Soft, non-tender, non-distended MUSCULOSKELETAL:  No edema; No deformity  SKIN: Warm and dry NEUROLOGIC:  Alert and oriented x 3 PSYCHIATRIC:  Normal affect   ASSESSMENT:    NSTEMI: Medically managed. No BRBPR, working up melena. Hgb has been stable. On iron. Can continue asa for now.  -S/p  DAPT ; cont asa 81 mg daily  -continue lipitor 40 mg daily (LDL 67 mg/dL at goal)  HTN: well controlled at home.  Continue coreg 25 mg BID, losartan, she's taking 50 mg BID.  Cardio Onc: Hx of NSCLC with large right upper lobe mass diagnosed 09/2015. S/p carboplatin and paclitaxel therapy last doses in 2017. S/p 50 Gy R central chest. Has not received high cardiotoxic risk therapy. Lower risk of XRT cardiotoxicity with central chest location PLAN:    In order of problems listed above:  Follow up in 1 year  Medication Adjustments/Labs and Tests Ordered: Current medicines are reviewed at length with the patient today.  Concerns regarding medicines are outlined above.  Orders Placed This Encounter  Procedures   EKG 12-Lead   No orders of the defined types were placed in this encounter.   Patient Instructions  Medication Instructions:  Your physician recommends that you continue on your current medications as directed. Please refer to the Current Medication list given to you today.  *If you need a refill on your cardiac medications before your next appointment, please call your pharmacy*   Follow-Up: At [facility], you and your health needs are our priority.  As part of our continuing mission to provide you with exceptional heart care, we have created designated Provider Care  Teams.  These Care Teams include your primary Cardiologist (physician) and Advanced Practice Providers (APPs -  Physician Assistants and Nurse Practitioners) who all work together to provide you with the care you need, when you need it.  We recommend signing up for the patient portal called "MyChart".  Sign up information is provided on this After Visit Summary.  MyChart is used to connect with patients for Virtual Visits (Telemedicine).  Patients are able to view lab/test results, encounter notes, upcoming appointments, etc.  Non-urgent messages can be sent to your provider as well.   To learn more about what you can do with MyChart, go to [email or web address].    Your next appointment:   12 month(s)  Provider:   Janina Mayo, MD      Signed, Janina Mayo, MD  01/11/2023 11:37 AM    [place]

## 2023-01-11 NOTE — Patient Instructions (Signed)
Medication Instructions:  Your physician recommends that you continue on your current medications as directed. Please refer to the Current Medication list given to you today.  *If you need a refill on your cardiac medications before your next appointment, please call your pharmacy*   Follow-Up: At [facility], you and your health needs are our priority.  As part of our continuing mission to provide you with exceptional heart care, we have created designated Provider Care Teams.  These Care Teams include your primary Cardiologist (physician) and Advanced Practice Providers (APPs -  Physician Assistants and Nurse Practitioners) who all work together to provide you with the care you need, when you need it.  We recommend signing up for the patient portal called "MyChart".  Sign up information is provided on this After Visit Summary.  MyChart is used to connect with patients for Virtual Visits (Telemedicine).  Patients are able to view lab/test results, encounter notes, upcoming appointments, etc.  Non-urgent messages can be sent to your provider as well.   To learn more about what you can do with MyChart, go to [email or web address].    Your next appointment:   12 month(s)  Provider:   Janina Mayo, MD

## 2023-01-18 ENCOUNTER — Other Ambulatory Visit: Payer: Self-pay

## 2023-01-18 MED ORDER — BREZTRI AEROSPHERE 160-9-4.8 MCG/ACT IN AERO: 2.0000 | INHALATION_SPRAY | Freq: Two times a day (BID) | RESPIRATORY_TRACT | 11 refills | Status: DC

## 2023-03-14 ENCOUNTER — Inpatient Hospital Stay: Payer: Medicare Other | Attending: Internal Medicine

## 2023-03-14 ENCOUNTER — Ambulatory Visit (HOSPITAL_COMMUNITY)
Admission: RE | Admit: 2023-03-14 | Discharge: 2023-03-14 | Disposition: A | Discharge status: Home / Self Care | Payer: Medicare Other | Source: Ambulatory Visit | Attending: Internal Medicine | Admitting: Internal Medicine

## 2023-03-14 DIAGNOSIS — C3411 Malignant neoplasm of upper lobe, right bronchus or lung: Secondary | ICD-10-CM | POA: Insufficient documentation

## 2023-03-14 DIAGNOSIS — I2510 Atherosclerotic heart disease of native coronary artery without angina pectoris: Secondary | ICD-10-CM | POA: Insufficient documentation

## 2023-03-14 DIAGNOSIS — M47814 Spondylosis without myelopathy or radiculopathy, thoracic region: Secondary | ICD-10-CM | POA: Diagnosis not present

## 2023-03-14 DIAGNOSIS — Z85528 Personal history of other malignant neoplasm of kidney: Secondary | ICD-10-CM | POA: Insufficient documentation

## 2023-03-14 DIAGNOSIS — I10 Essential (primary) hypertension: Secondary | ICD-10-CM | POA: Insufficient documentation

## 2023-03-14 DIAGNOSIS — Z7982 Long term (current) use of aspirin: Secondary | ICD-10-CM | POA: Insufficient documentation

## 2023-03-14 DIAGNOSIS — M129 Arthropathy, unspecified: Secondary | ICD-10-CM | POA: Insufficient documentation

## 2023-03-14 DIAGNOSIS — J449 Chronic obstructive pulmonary disease, unspecified: Secondary | ICD-10-CM | POA: Insufficient documentation

## 2023-03-14 DIAGNOSIS — Z79899 Other long term (current) drug therapy: Secondary | ICD-10-CM | POA: Insufficient documentation

## 2023-03-14 DIAGNOSIS — C3490 Malignant neoplasm of unspecified part of unspecified bronchus or lung: Secondary | ICD-10-CM

## 2023-03-14 DIAGNOSIS — K219 Gastro-esophageal reflux disease without esophagitis: Secondary | ICD-10-CM | POA: Insufficient documentation

## 2023-03-14 DIAGNOSIS — Z923 Personal history of irradiation: Secondary | ICD-10-CM | POA: Insufficient documentation

## 2023-03-14 DIAGNOSIS — E785 Hyperlipidemia, unspecified: Secondary | ICD-10-CM | POA: Insufficient documentation

## 2023-03-14 DIAGNOSIS — M4854XA Collapsed vertebra, not elsewhere classified, thoracic region, initial encounter for fracture: Secondary | ICD-10-CM | POA: Diagnosis not present

## 2023-03-14 DIAGNOSIS — I252 Old myocardial infarction: Secondary | ICD-10-CM | POA: Insufficient documentation

## 2023-03-14 DIAGNOSIS — I509 Heart failure, unspecified: Secondary | ICD-10-CM | POA: Diagnosis not present

## 2023-03-14 DIAGNOSIS — J432 Centrilobular emphysema: Secondary | ICD-10-CM | POA: Diagnosis not present

## 2023-03-14 LAB — CMP (CANCER CENTER ONLY)
ALT: 18 U/L (ref 0–44)
AST: 21 U/L (ref 15–41)
Albumin: 4.3 g/dL (ref 3.5–5.0)
Alkaline Phosphatase: 63 U/L (ref 38–126)
Anion gap: 4 — ABNORMAL LOW (ref 5–15)
BUN: 28 mg/dL — ABNORMAL HIGH (ref 8–23)
CO2: 35 mmol/L — ABNORMAL HIGH (ref 22–32)
Calcium: 9.4 mg/dL (ref 8.9–10.3)
Chloride: 98 mmol/L (ref 98–111)
Creatinine: 1.08 mg/dL — ABNORMAL HIGH (ref 0.44–1.00)
GFR, Estimated: 52 mL/min — ABNORMAL LOW (ref >60)
Glucose, Bld: 102 mg/dL — ABNORMAL HIGH (ref 70–99)
Potassium: 4.9 mmol/L (ref 3.5–5.1)
Sodium: 137 mmol/L (ref 135–145)
Total Bilirubin: 0.7 mg/dL (ref 0.3–1.2)
Total Protein: 7 g/dL (ref 6.5–8.1)

## 2023-03-14 LAB — CBC WITH DIFFERENTIAL (CANCER CENTER ONLY)
Abs Immature Granulocytes: 0.02 10*3/uL (ref 0.00–0.07)
Basophils Absolute: 0.1 10*3/uL (ref 0.0–0.1)
Basophils Relative: 1 %
Eosinophils Absolute: 0.3 10*3/uL (ref 0.0–0.5)
Eosinophils Relative: 4 %
HCT: 37.8 % (ref 36.0–46.0)
Hemoglobin: 12.3 g/dL (ref 12.0–15.0)
Immature Granulocytes: 0 %
Lymphocytes Relative: 19 %
Lymphs Abs: 1.4 10*3/uL (ref 0.7–4.0)
MCH: 32 pg (ref 26.0–34.0)
MCHC: 32.5 g/dL (ref 30.0–36.0)
MCV: 98.4 fL (ref 80.0–100.0)
Monocytes Absolute: 0.7 10*3/uL (ref 0.1–1.0)
Monocytes Relative: 9 %
Neutro Abs: 5.2 10*3/uL (ref 1.7–7.7)
Neutrophils Relative %: 67 %
Platelet Count: 197 10*3/uL (ref 150–400)
RBC: 3.84 MIL/uL — ABNORMAL LOW (ref 3.87–5.11)
RDW: 14.6 % (ref 11.5–15.5)
WBC Count: 7.7 10*3/uL (ref 4.0–10.5)
nRBC: 0 % (ref 0.0–0.2)

## 2023-03-19 ENCOUNTER — Inpatient Hospital Stay (HOSPITAL_BASED_OUTPATIENT_CLINIC_OR_DEPARTMENT_OTHER): Payer: Medicare Other | Admitting: Internal Medicine

## 2023-03-19 ENCOUNTER — Ambulatory Visit: Payer: Medicare Other | Admitting: Emergency Medicine

## 2023-03-19 VITALS — BP 142/80 | HR 61 | Temp 97.8°F | Resp 13 | Wt 175.5 lb

## 2023-03-19 DIAGNOSIS — C3490 Malignant neoplasm of unspecified part of unspecified bronchus or lung: Secondary | ICD-10-CM | POA: Diagnosis not present

## 2023-03-19 DIAGNOSIS — J432 Centrilobular emphysema: Secondary | ICD-10-CM | POA: Diagnosis not present

## 2023-03-19 DIAGNOSIS — I2510 Atherosclerotic heart disease of native coronary artery without angina pectoris: Secondary | ICD-10-CM | POA: Diagnosis not present

## 2023-03-19 DIAGNOSIS — M129 Arthropathy, unspecified: Secondary | ICD-10-CM | POA: Diagnosis not present

## 2023-03-19 DIAGNOSIS — C3411 Malignant neoplasm of upper lobe, right bronchus or lung: Secondary | ICD-10-CM | POA: Diagnosis not present

## 2023-03-19 DIAGNOSIS — M4854XA Collapsed vertebra, not elsewhere classified, thoracic region, initial encounter for fracture: Secondary | ICD-10-CM | POA: Diagnosis not present

## 2023-03-19 DIAGNOSIS — M47814 Spondylosis without myelopathy or radiculopathy, thoracic region: Secondary | ICD-10-CM | POA: Diagnosis not present

## 2023-03-19 NOTE — Progress Notes (Signed)
[facility] [phone]   Fax:([phone]) [phone]  OFFICE PROGRESS NOTE  Georgina Quint, MD [address] [place] [address]  DIAGNOSIS: Stage IIIA (T2a, N2, M0) non-small cell lung cancer, squamous cell carcinoma presented with large right upper lobe lung mass with right hilar involvement diagnosed in November 2016.  PRIOR THERAPY:  1) Neoadjuvant systemic chemotherapy with carboplatin for AUC of 6 and paclitaxel 200 MG/M2 every 3 weeks, status post 3 cycles with partial response. 2) Video bronchoscopy, right video-assisted thoracoscopy, minithoracotomy, right upper lobectomy with lymph node dissection on 01/18/2016. The final pathology revealed residual squamous cell carcinoma measuring 3.1 cm with close resection margin and metastatic carcinoma to lymph nodes at levels 10R, 12R and 4R. (ypT2a, yN2).  3) Concurrent chemoradiation with weekly carboplatin for AUC of 2 and paclitaxel 45 MG/M2. First dose 02/27/2016. Status post 5 cycles. Last dose was given 03/26/2016. 4) SBRT to left lower lobe lung nodule under the care of Dr. Roselind Messier completed on 11/06/2016.  CURRENT THERAPY: Observation.  INTERVAL HISTORY: Tammy Ball 82 y.o. female returns to clinic today for follow-up visit.  The patient is feeling fine today with no concerning complaints except for the baseline shortness of breath and she is currently on home oxygen.  She denied having any current chest pain but has mild cough with no hemoptysis.  She has no nausea, vomiting, diarrhea or constipation.  She has no headache or visual changes.  She has no fever or chills.  She is here today for evaluation with repeat CT scan of the chest for restaging of her disease.  MEDICAL HISTORY: Past Medical History:  Diagnosis Date   Anemia    was while doing chemo   Anxiety    Arthritis    Asthma    Chemotherapy-induced neuropathy (HCC) 11/01/2015   CHF (congestive heart failure) (HCC)    Claustrophobia     COPD (chronic obstructive pulmonary disease) (HCC)    Depression    Encounter for antineoplastic chemotherapy 02/09/2016   GERD (gastroesophageal reflux disease)    Headache    prior to menopause   Heart murmur    History of echocardiogram    Echo 2/17: EF 60-65%, grade 1 diastolic dysfunction, mild MR, trivial pericardial effusion   History of nuclear stress test    Myoview 2/17: no ischemia or scar, EF 79%; low risk   History of radiation therapy 10/30/16-11/06/16   Hyperlipidemia    Hypertension    Insomnia 05/16/2016   Myocardial infarction (HCC)    Non-small cell carcinoma of right lung, stage 2 (HCC) 10/03/2015   Oxygen deficiency    Pneumonia    Radiation 02/29/16-04/10/16   50.4 Gy to right central chest   Renal cell carcinoma (HCC)    L nephrectomy  in 2012    ALLERGIES:  is allergic to bee venom, amlodipine, levofloxacin, alendronate, and hctz [hydrochlorothiazide].  MEDICATIONS:  Current Outpatient Medications  Medication Sig Dispense Refill   acetaminophen (TYLENOL) 500 MG tablet Take 500 mg by mouth every 6 (six) hours as needed for headache or moderate pain (pain).     albuterol (PROVENTIL) (2.5 MG/3ML) 0.083% nebulizer solution Take 3 mLs (2.5 mg total) by nebulization every 6 (six) hours as needed for wheezing or shortness of breath. 75 mL 6   albuterol (VENTOLIN HFA) 108 (90 Base) MCG/ACT inhaler Inhale 1-2 puffs into the lungs every 6 (six) hours as needed for wheezing or shortness of breath. 1 each 6  Ascorbic Acid (VITAMIN C) 1000 MG tablet Take 1,000 mg by mouth every morning.     aspirin EC 81 MG tablet Take 81 mg by mouth every morning.     atorvastatin (LIPITOR) 40 MG tablet TAKE 1 TABLET BY MOUTH EVERY DAY 90 tablet 3   azithromycin (ZITHROMAX Z-PAK) 250 MG tablet Take 2 tabs today, then 1 tab until gone 6 each 2   Budeson-Glycopyrrol-Formoterol (BREZTRI AEROSPHERE) 160-9-4.8 MCG/ACT AERO Inhale 2 puffs into the lungs in the morning and at bedtime.  10.7 g 11   Calcium Carbonate Antacid (TUMS PO) Take 1 tablet by mouth 4 (four) times daily as needed (heartburn).     Carboxymethylcellulose Sodium (ARTIFICIAL TEARS OP) Place 1 drop into both eyes daily as needed (dry eyes).     carvedilol (COREG) 25 MG tablet TAKE 1 TABLET BY MOUTH TWICE A DAY 180 tablet 2   cholecalciferol (VITAMIN D3) 25 MCG (1000 UNIT) tablet Take 1,000 Units by mouth daily.     diphenhydrAMINE (BENADRYL) 25 mg capsule Take 25 mg by mouth at bedtime as needed for allergies.     ferrous sulfate 325 (65 FE) MG tablet TAKE 1 TABLET BY MOUTH EVERY DAY WITH BREAKFAST 90 tablet 1   Guaifenesin (MUCINEX MAXIMUM STRENGTH) 1200 MG TB12 Take 1,200 mg by mouth every morning.     losartan (COZAAR) 100 MG tablet TAKE 1 TABLET BY MOUTH EVERY DAY (Patient taking differently: Take 50 mg by mouth 2 (two) times daily.) 90 tablet 1   Omega-3 Fatty Acids (FISH OIL) 1200 MG CAPS Take 1,200 mg by mouth every morning.     OXYGEN Inhale 2 L into the lungs as needed (to keep SATS at or above 90).     pantoprazole (PROTONIX) 40 MG tablet Take 40 mg by mouth 2 (two) times daily.     sodium chloride (OCEAN) 0.65 % SOLN nasal spray Place 1 spray into both nostrils at bedtime as needed for congestion.     traMADol (ULTRAM) 50 MG tablet Take 1 tablet (50 mg total) by mouth every 6 (six) hours as needed (BACK PAIN). 50 tablet 1   No current facility-administered medications for this visit.    SURGICAL HISTORY:  Past Surgical History:  Procedure Laterality Date   BACK SURGERY     cervical 1991   BIOPSY  11/15/2022   Procedure: BIOPSY;  Surgeon: Shellia Cleverly, DO;  Location: [facility];  Service: Gastroenterology;;   COLONOSCOPY WITH PROPOFOL N/A 11/15/2022   Procedure: COLONOSCOPY WITH PROPOFOL;  Surgeon: Shellia Cleverly, DO;  Location: [facility];  Service: Gastroenterology;  Laterality: N/A;   ESOPHAGOGASTRODUODENOSCOPY (EGD) WITH PROPOFOL N/A 11/15/2022   Procedure:  ESOPHAGOGASTRODUODENOSCOPY (EGD) WITH PROPOFOL;  Surgeon: Shellia Cleverly, DO;  Location: [facility];  Service: Gastroenterology;  Laterality: N/A;   EYE SURGERY     IR GENERIC HISTORICAL  01/16/2017   IR RADIOLOGIST EVAL & MGMT 01/16/2017 [facility]   IR GENERIC HISTORICAL  01/31/2017   IR FLUORO GUIDED NEEDLE PLC ASPIRATION/INJECTION LOC 01/31/2017 Julieanne Cotton, MD [facility]   IR GENERIC HISTORICAL  01/31/2017   IR VERTEBROPLASTY CERV/THOR BX INC UNI/BIL INC/INJECT/IMAGING 01/31/2017 Julieanne Cotton, MD [facility]   IR GENERIC HISTORICAL  01/31/2017   IR VERTEBROPLASTY EA ADDL (T&LS) BX INC UNI/BIL INC INJECT/IMAGING 01/31/2017 Julieanne Cotton, MD [facility]   kidney cancer     LOBECTOMY Right    RUL   NEPHRECTOMY  2012   POLYPECTOMY  11/15/2022   Procedure: POLYPECTOMY;  Surgeon: Shellia Cleverly, DO;  Location: [facility];  Service: Gastroenterology;;   SPINE SURGERY     TUBAL LIGATION     VIDEO ASSISTED THORACOSCOPY (VATS)/WEDGE RESECTION Right 01/18/2016   Procedure: VIDEO ASSISTED THORACOSCOPY (VATS)/LUNG RESECTION, THOROCOTOMY, RIGHT UPPER LOBECTOMY, LYMPH NODE DISSECTION, PLACEMENT OF ON Q;  Surgeon: Delight Ovens, MD;  Location: [facility];  Service: Thoracic;  Laterality: Right;   VIDEO BRONCHOSCOPY Bilateral 09/20/2015   Procedure: VIDEO BRONCHOSCOPY WITHOUT FLUORO;  Surgeon: Oretha Milch, MD;  Location: [facility];  Service: Cardiopulmonary;  Laterality: Bilateral;   VIDEO BRONCHOSCOPY N/A 01/18/2016   Procedure: VIDEO BRONCHOSCOPY;  Surgeon: Delight Ovens, MD;  Location: [facility];  Service: Thoracic;  Laterality: N/A;    REVIEW OF SYSTEMS:  A comprehensive review of systems was negative except for: Respiratory: positive for dyspnea on exertion   PHYSICAL EXAMINATION: General appearance: alert, cooperative and no distress Head: Normocephalic, without obvious abnormality, atraumatic Neck: no adenopathy, no JVD, supple, symmetrical, trachea midline  and thyroid not enlarged, symmetric, no tenderness/mass/nodules Lymph nodes: Cervical, supraclavicular, and axillary nodes normal. Resp: clear to auscultation bilaterally Back: symmetric, no curvature. ROM normal. No CVA tenderness. Cardio: regular rate and rhythm, S1, S2 normal, no murmur, click, rub or gallop GI: soft, non-tender; bowel sounds normal; no masses,  no organomegaly Extremities: extremities normal, atraumatic, no cyanosis or edema  ECOG PERFORMANCE STATUS: 1 - Symptomatic but completely ambulatory  Blood pressure (!) 142/80, pulse 61, temperature 97.8 F (36.6 C), temperature source Oral, resp. rate 13, weight 175 lb 8 oz (79.6 kg), SpO2 98 %.  LABORATORY DATA: Lab Results  Component Value Date   WBC 7.7 03/14/2023   HGB 12.3 03/14/2023   HCT 37.8 03/14/2023   MCV 98.4 03/14/2023   PLT 197 03/14/2023      Chemistry      Component Value Date/Time   NA 137 03/14/2023 0921   NA 140 11/21/2018 1058   NA 140 11/11/2017 0939   K 4.9 03/14/2023 0921   K 4.6 11/11/2017 0939   CL 98 03/14/2023 0921   CO2 35 (H) 03/14/2023 0921   CO2 28 11/11/2017 0939   BUN 28 (H) 03/14/2023 0921   BUN 23 11/21/2018 1058   BUN 30.3 (H) 11/11/2017 0939   CREATININE 1.08 (H) 03/14/2023 0921   CREATININE 1.3 (H) 11/11/2017 0939   GLU 105 12/10/2009 0000      Component Value Date/Time   CALCIUM 9.4 03/14/2023 0921   CALCIUM 10.0 11/11/2017 0939   ALKPHOS 63 03/14/2023 0921   ALKPHOS 102 11/11/2017 0939   AST 21 03/14/2023 0921   AST 18 11/11/2017 0939   ALT 18 03/14/2023 0921   ALT 19 11/11/2017 0939   BILITOT 0.7 03/14/2023 0921   BILITOT 0.42 11/11/2017 0939       RADIOGRAPHIC STUDIES: CT Chest Wo Contrast  Result Date: 03/18/2023 CLINICAL DATA:  Stage IIIA right upper lobe squamous cell lung cancer status post right upper lobectomy and adjuvant chemoradiation therapy. Radiation therapy to the left lower lobe pulmonary nodule completed 11/06/2016. Interval observation.  Restaging. * Tracking Code: BO * EXAM: CT CHEST WITHOUT CONTRAST TECHNIQUE: Multidetector CT imaging of the chest was performed following the standard protocol without IV contrast. RADIATION DOSE REDUCTION: This exam was performed according to the departmental dose-optimization program which includes automated exposure control, adjustment of the mA and/or kV according to patient size and/or use of iterative reconstruction technique. COMPARISON:  12/14/2022 chest CT. FINDINGS: Cardiovascular: Top normal heart  size. No significant pericardial effusion/thickening. Left anterior descending and left circumflex coronary atherosclerosis. Atherosclerotic nonaneurysmal thoracic aorta. Dilated main pulmonary artery (3.7 cm diameter). Mediastinum/Nodes: No significant thyroid nodules. Unremarkable esophagus. No pathologically enlarged axillary, mediastinal or hilar lymph nodes, noting limited sensitivity for the detection of hilar adenopathy on this noncontrast study. Lungs/Pleura: No pneumothorax. No pleural effusion. Status post right upper lobectomy. Moderate centrilobular emphysema. Mild sharply marginated patchy right perihilar lung consolidation with associated mild distortion, unchanged and compatible with mild radiation fibrosis. Solid 0.4 cm medial right middle lobe pulmonary nodule (series 7/image 41), stable. Thick irregular bandlike consolidation at the posterior left lung base with associated volume loss and distortion, similar and compatible with postradiation change, with overall improved aeration at the left lung base in the interval. No acute consolidative airspace disease or new significant pulmonary nodules. Upper abdomen: No acute abnormality. Musculoskeletal: No aggressive appearing focal osseous lesions. Chronic moderate T3 and severe T4 vertebral compression fracture status post vertebroplasty. Mild thoracic spondylosis. Partially visualized surgical hardware from ACDF in the lower cervical spine.  IMPRESSION: 1. No evidence of local tumor recurrence in the right lung status post right upper lobectomy. 2. Stable mild radiation fibrosis in the right perihilar lung and posterior left lung base. 3. No evidence of metastatic disease in the chest. 4. Two-vessel coronary atherosclerosis. 5. Dilated main pulmonary artery, suggesting pulmonary arterial hypertension. 6. Aortic Atherosclerosis (ICD10-I70.0) and Emphysema (ICD10-J43.9). Electronically Signed   By: Delbert Phenix M.D.   On: 03/18/2023 12:34     ASSESSMENT AND PLAN:  This is a very pleasant 82 years old white female with stage IIIA non-small cell lung cancer, squamous cell carcinoma status post neoadjuvant systemic chemotherapy was carboplatin and paclitaxel with partial response followed by right upper lobectomy and lymph node dissection. She was found to have evidence of metastatic disease to the mediastinal lymph nodes and close resection margin. The patient underwent a course of concurrent chemoradiation with weekly carboplatin and paclitaxel. She was also treated with SBRT left lower lobe pulmonary nodule under the care of Dr. Roselind Messier. The patient has been in observation since 2017 and she is doing fine. She had repeat CT scan of the chest performed recently.  I personally and independently reviewed the scan and discussed the results with the patient today. Her scan showed no concerning findings for disease recurrence or metastasis. I recommended for her to continue on observation with repeat CT scan of the chest in 1 year. She was advised to call immediately if she has any other concerning symptoms in the interval.  The patient voices understanding of current disease status and treatment options and is in agreement with the current care plan. All questions were answered. The patient knows to call the clinic with any problems, questions or concerns. We can certainly see the patient much sooner if necessary.  Disclaimer: This note was  dictated with voice recognition software. Similar sounding words can inadvertently be transcribed and may not be corrected upon review.

## 2023-03-20 ENCOUNTER — Ambulatory Visit (INDEPENDENT_AMBULATORY_CARE_PROVIDER_SITE_OTHER): Payer: Medicare Other | Admitting: Emergency Medicine

## 2023-03-20 ENCOUNTER — Encounter: Payer: Self-pay | Admitting: Emergency Medicine

## 2023-03-20 VITALS — BP 126/84 | HR 60 | Temp 98.6°F | Ht 63.5 in | Wt 177.1 lb

## 2023-03-20 DIAGNOSIS — I252 Old myocardial infarction: Secondary | ICD-10-CM

## 2023-03-20 DIAGNOSIS — E6609 Other obesity due to excess calories: Secondary | ICD-10-CM | POA: Diagnosis not present

## 2023-03-20 DIAGNOSIS — I10 Essential (primary) hypertension: Secondary | ICD-10-CM

## 2023-03-20 DIAGNOSIS — Z6830 Body mass index (BMI) 30.0-30.9, adult: Secondary | ICD-10-CM

## 2023-03-20 DIAGNOSIS — N1831 Chronic kidney disease, stage 3a: Secondary | ICD-10-CM | POA: Diagnosis not present

## 2023-03-20 DIAGNOSIS — I5032 Chronic diastolic (congestive) heart failure: Secondary | ICD-10-CM

## 2023-03-20 DIAGNOSIS — I700 Atherosclerosis of aorta: Secondary | ICD-10-CM | POA: Diagnosis not present

## 2023-03-20 DIAGNOSIS — J9611 Chronic respiratory failure with hypoxia: Secondary | ICD-10-CM | POA: Diagnosis not present

## 2023-03-20 MED ORDER — SEMAGLUTIDE-WEIGHT MANAGEMENT 0.25 MG/0.5ML ~~LOC~~ SOAJ
0.2500 mg | SUBCUTANEOUS | 3 refills | Status: DC
Start: 2023-03-20 — End: 2023-08-26

## 2023-03-20 NOTE — Assessment & Plan Note (Signed)
Stable.  Advised to stay well-hydrated and avoid NSAIDs. 

## 2023-03-20 NOTE — Assessment & Plan Note (Signed)
Clinically euvolemic. Not retaining fluid. Stable.  No signs of acute CHF

## 2023-03-20 NOTE — Progress Notes (Signed)
Tammy Ball 82 y.o.   Chief Complaint  Patient presents with   Medical Management of Chronic Issues    f/u appt, patient concern about weight gain     HISTORY OF PRESENT ILLNESS: This is a 82 y.o. female A1A here for 44-month follow-up of chronic medical conditions Overall doing well but concerned about weight gain and inability to lose weight Inquiring about semaglutide.  Would like to start it. Wt Readings from Last 3 Encounters:  03/20/23 177 lb 2 oz (80.3 kg)  03/19/23 175 lb 8 oz (79.6 kg)  01/11/23 175 lb (79.4 kg)     HPI   Prior to Admission medications   Medication Sig Start Date End Date Taking? Authorizing Provider  acetaminophen (TYLENOL) 500 MG tablet Take 500 mg by mouth every 6 (six) hours as needed for headache or moderate pain (pain).   Yes [provider]  albuterol (PROVENTIL) (2.5 MG/3ML) 0.083% nebulizer solution Take 3 mLs (2.5 mg total) by nebulization every 6 (six) hours as needed for wheezing or shortness of breath. 01/09/22  Yes Leslye Peer, MD  albuterol (VENTOLIN HFA) 108 (90 Base) MCG/ACT inhaler Inhale 1-2 puffs into the lungs every 6 (six) hours as needed for wheezing or shortness of breath. 10/25/21  Yes Desarea Ohagan, Eilleen Kempf, MD  Ascorbic Acid (VITAMIN C) 1000 MG tablet Take 1,000 mg by mouth every morning.   Yes [provider]  aspirin EC 81 MG tablet Take 81 mg by mouth every morning.   Yes [provider]  atorvastatin (LIPITOR) 40 MG tablet TAKE 1 TABLET BY MOUTH EVERY DAY 05/08/22  Yes Carlesha Seiple, Eilleen Kempf, MD  Budeson-Glycopyrrol-Formoterol (BREZTRI AEROSPHERE) 160-9-4.8 MCG/ACT AERO Inhale 2 puffs into the lungs in the morning and at bedtime. 01/18/23  Yes Leslye Peer, MD  Calcium Carbonate Antacid (TUMS PO) Take 1 tablet by mouth 4 (four) times daily as needed (heartburn).   Yes [provider]  Carboxymethylcellulose Sodium (ARTIFICIAL TEARS OP) Place 1 drop into both eyes daily as needed  (dry eyes).   Yes [provider]  carvedilol (COREG) 25 MG tablet TAKE 1 TABLET BY MOUTH TWICE A DAY 10/08/22  Yes Branch, Alben Spittle, MD  cholecalciferol (VITAMIN D3) 25 MCG (1000 UNIT) tablet Take 1,000 Units by mouth daily.   Yes [provider]  diphenhydrAMINE (BENADRYL) 25 mg capsule Take 25 mg by mouth at bedtime as needed for allergies.   Yes [provider]  Guaifenesin (MUCINEX MAXIMUM STRENGTH) 1200 MG TB12 Take 1,200 mg by mouth every morning.   Yes [provider]  losartan (COZAAR) 100 MG tablet TAKE 1 TABLET BY MOUTH EVERY DAY Patient taking differently: Take 50 mg by mouth 2 (two) times daily. 10/29/22  Yes Trajon Rosete, Eilleen Kempf, MD  Omega-3 Fatty Acids (FISH OIL) 1200 MG CAPS Take 1,200 mg by mouth every morning.   Yes [provider]  OXYGEN Inhale 2 L into the lungs as needed (to keep SATS at or above 90).   Yes [provider]  sodium chloride (OCEAN) 0.65 % SOLN nasal spray Place 1 spray into both nostrils at bedtime as needed for congestion.   Yes [provider]  traMADol (ULTRAM) 50 MG tablet Take 1 tablet (50 mg total) by mouth every 6 (six) hours as needed (BACK PAIN). 09/06/21  Yes Tommy Minichiello, Eilleen Kempf, MD    Allergies  Allergen Reactions   Bee Venom Anaphylaxis, Shortness Of Breath, Swelling and Other (See Comments)  Swelling at site (reaction to bees and wasps)   Amlodipine Swelling and Other (See Comments)    Swelling of the ankles and hands    Levofloxacin Other (See Comments)    Joint pain    Alendronate Other (See Comments)    Joint pains / hypercalcemia    Hctz [Hydrochlorothiazide] Palpitations and Other (See Comments)    Sweating     Patient Active Problem List   Diagnosis Date Noted   Iron deficiency anemia 11/15/2022   Adenomatous polyp of ascending colon 11/15/2022   Adenomatous polyp of transverse colon 11/15/2022   Adenomatous polyp of descending colon 11/15/2022   Internal  hemorrhoids 11/15/2022   History of myocardial infarction 09/20/2022   Gastroesophageal reflux disease without esophagitis 06/19/2022   Hyponatremia 06/09/2022   Chronic bilateral low back pain without sciatica 09/06/2021   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) 02/21/2021   Recurrent major depressive disorder, in full remission (HCC) 02/21/2021   Atherosclerosis of aorta (HCC) 02/21/2021   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   Acute bronchitis with COPD (HCC)    Chronic diastolic CHF (congestive heart failure) (HCC)    Stage 3a chronic kidney disease (HCC) 11/10/2020   Allergic rhinitis 01/06/2019   Chronic respiratory failure with hypoxia (HCC) 12/26/2018   Age-related osteoporosis with current pathological fracture 11/21/2018   Renal insufficiency 05/20/2018   History of compression fracture of spine 12/10/2016   Hypercalcemia 06/25/2016   Insomnia 05/16/2016   Pneumonitis, radiation (HCC) 04/11/2016   S/P lobectomy of lung 01/18/2016   Chemotherapy-induced neuropathy (HCC) 11/01/2015   Non-small cell carcinoma of right lung, stage 2 (HCC) 10/03/2015   Hyperlipidemia 08/14/2013   Spondylolisthesis of lumbar region 07/03/2013   Essential hypertension 12/17/2011   COPD GOLD B-C 12/17/2011   Renal cell cancer (HCC) 12/17/2011   Depression 12/17/2011    Past Medical History:  Diagnosis Date   Anemia    was while doing chemo   Anxiety    Arthritis    Asthma    Chemotherapy-induced neuropathy (HCC) 11/01/2015   CHF (congestive heart failure) (HCC)    Claustrophobia    COPD (chronic obstructive pulmonary disease) (HCC)    Depression    Encounter for antineoplastic chemotherapy 02/09/2016   GERD (gastroesophageal reflux disease)    Headache    prior to menopause   Heart murmur    History of echocardiogram    Echo 2/17: EF 60-65%, grade 1 diastolic dysfunction, mild MR, trivial pericardial effusion   History of nuclear  stress test    Myoview 2/17: no ischemia or scar, EF 79%; low risk   History of radiation therapy 10/30/16-11/06/16   Hyperlipidemia    Hypertension    Insomnia 05/16/2016   Myocardial infarction (HCC)    Non-small cell carcinoma of right lung, stage 2 (HCC) 10/03/2015   Oxygen deficiency    Pneumonia    Radiation 02/29/16-04/10/16   50.4 Gy to right central chest   Renal cell carcinoma (HCC)    L nephrectomy  in 2012    Past Surgical History:  Procedure Laterality Date   BACK SURGERY     cervical 1991   BIOPSY  11/15/2022   Procedure: BIOPSY;  Surgeon: Shellia Cleverly, DO;  Location: [facility];  Service: Gastroenterology;;   COLONOSCOPY WITH PROPOFOL N/A 11/15/2022   Procedure: COLONOSCOPY WITH PROPOFOL;  Surgeon: Shellia Cleverly, DO;  Location: [facility];  Service: Gastroenterology;  Laterality: N/A;   ESOPHAGOGASTRODUODENOSCOPY (EGD) WITH PROPOFOL  N/A 11/15/2022   Procedure: ESOPHAGOGASTRODUODENOSCOPY (EGD) WITH PROPOFOL;  Surgeon: Shellia Cleverly, DO;  Location: [facility];  Service: Gastroenterology;  Laterality: N/A;   EYE SURGERY     IR GENERIC HISTORICAL  01/16/2017   IR RADIOLOGIST EVAL & MGMT 01/16/2017 [facility]   IR GENERIC HISTORICAL  01/31/2017   IR FLUORO GUIDED NEEDLE PLC ASPIRATION/INJECTION LOC 01/31/2017 Julieanne Cotton, MD [facility]   IR GENERIC HISTORICAL  01/31/2017   IR VERTEBROPLASTY CERV/THOR BX INC UNI/BIL INC/INJECT/IMAGING 01/31/2017 Julieanne Cotton, MD [facility]   IR GENERIC HISTORICAL  01/31/2017   IR VERTEBROPLASTY EA ADDL (T&LS) BX INC UNI/BIL INC INJECT/IMAGING 01/31/2017 Julieanne Cotton, MD [facility]   kidney cancer     LOBECTOMY Right    RUL   NEPHRECTOMY  2012   POLYPECTOMY  11/15/2022   Procedure: POLYPECTOMY;  Surgeon: Shellia Cleverly, DO;  Location: [facility];  Service: Gastroenterology;;   SPINE SURGERY     TUBAL LIGATION     VIDEO ASSISTED THORACOSCOPY (VATS)/WEDGE RESECTION Right 01/18/2016    Procedure: VIDEO ASSISTED THORACOSCOPY (VATS)/LUNG RESECTION, THOROCOTOMY, RIGHT UPPER LOBECTOMY, LYMPH NODE DISSECTION, PLACEMENT OF ON Q;  Surgeon: Delight Ovens, MD;  Location: [facility];  Service: Thoracic;  Laterality: Right;   VIDEO BRONCHOSCOPY Bilateral 09/20/2015   Procedure: VIDEO BRONCHOSCOPY WITHOUT FLUORO;  Surgeon: Oretha Milch, MD;  Location: [facility];  Service: Cardiopulmonary;  Laterality: Bilateral;   VIDEO BRONCHOSCOPY N/A 01/18/2016   Procedure: VIDEO BRONCHOSCOPY;  Surgeon: Delight Ovens, MD;  Location: [facility];  Service: Thoracic;  Laterality: N/A;    Social History   Socioeconomic History   Marital status: Widowed    Spouse name: Not on file   Number of children: 4   Years of education: Not on file   Highest education level: Master's degree (e.g., MA, MS, MEng, MEd, MSW, MBA)  Occupational History   Not on file  Tobacco Use   Smoking status: Former    Packs/day: 1.00    Years: 50.00    Additional pack years: 0.00    Total pack years: 50.00    Types: Cigarettes    Quit date: 03/16/2011    Years since quitting: 12.0   Smokeless tobacco: Never   Tobacco comments:    last 4-5 years of smoking, smoked 0.5 pack/day   Vaping Use   Vaping Use: Never used  Substance and Sexual Activity   Alcohol use: No    Alcohol/week: 0.0 standard drinks of alcohol   Drug use: No   Sexual activity: Not Currently  Other Topics Concern   Not on file  Social History Narrative   Marital status: divorced; not dating in 2019.      Children: 4 children; 3 grandchildren adult; 4 gg.      Lives: alone in house      Employment: part-time work; substance abuse counselor; [employer].      Tobacco: quit smoking 2012; smoked 45 years      Alcohol: none      Drugs: none      Exercise: none in 2019; due to LEFT sciatica.      ADLs: independent with ADLs; drives.       Advanced Directives: YES: HCPOA: Nicholas Martinez/son.  FULL CODE but no prolonged measures.       Occupation: Substance Abuse Administrator, [employer]   No exercise** Merged History Encounter **       ** Data from: 12/14/11 Enc  Dept: [facility]       ** Data from: 12/17/11 Enc Dept: [facility]   Substance abuse counselor   Husband deceased   4 great grandchildren   Son works in same substance abuse counseling center as patient   Social Determinants of Corporate investment banker Strain: Medium Risk (03/16/2023)   Overall Financial Resource Strain (CARDIA)    Difficulty of Paying Living Expenses: Somewhat hard  Food Insecurity: No Food Insecurity (03/16/2023)   Hunger Vital Sign    Worried About Running Out of Food in the Last Year: Never true    Ran Out of Food in the Last Year: Never true  Transportation Needs: Unknown (03/16/2023)   PRAPARE - Transportation    Lack of Transportation (Medical): No    Lack of Transportation (Non-Medical): Patient declined  Physical Activity: Inactive (03/16/2023)   Exercise Vital Sign    Days of Exercise per Week: 0 days    Minutes of Exercise per Session: 20 min  Stress: No Stress Concern Present (03/16/2023)   [employer] of Occupational Health - Occupational Stress Questionnaire    Feeling of Stress : Only a little  Social Connections: Unknown (03/16/2023)   Social Connection and Isolation Panel [NHANES]    Frequency of Communication with Friends and Family: More than three times a week    Frequency of Social Gatherings with Friends and Family: Three times a week    Attends Religious Services: Patient declined    Active Member of Clubs or Organizations: No    Attends Banker Meetings: Not on file    Marital Status: Divorced  Intimate Partner Violence: Not At Risk (05/18/2021)   Humiliation, Afraid, Rape, and Kick questionnaire    Fear of Current or Ex-Partner: No    Emotionally Abused: No    Physically Abused: No    Sexually Abused: No    Family History  Problem Relation Age of Onset    Cancer Mother        Bladder Cancer   Hypertension Mother    CAD Mother 61   Heart disease Sister    Breast cancer Sister    Birth defects Sister    Obesity Brother    Cancer Maternal Grandmother    Heart attack Daughter 77       s/p CABG   Glaucoma Daughter    Colon cancer Neg Hx    Colon polyps Neg Hx    Esophageal cancer Neg Hx    Rectal cancer Neg Hx    Stomach cancer Neg Hx      Review of Systems  Constitutional: Negative.  Negative for chills and fever.  HENT: Negative.  Negative for congestion and sore throat.   Respiratory: Negative.  Negative for cough and shortness of breath.   Cardiovascular: Negative.  Negative for chest pain and palpitations.  Gastrointestinal:  Negative for abdominal pain, nausea and vomiting.  Genitourinary: Negative.   Skin: Negative.  Negative for rash.  Neurological: Negative.  Negative for dizziness and headaches.  All other systems reviewed and are negative.   Vitals:   03/20/23 1032  BP: 126/84  Pulse: 60  Temp: 98.6 F (37 C)  SpO2: 97%    Physical Exam Vitals reviewed.  Constitutional:      Appearance: Normal appearance.  HENT:     Head: Normocephalic.     Mouth/Throat:     Mouth: Mucous membranes are moist.     Pharynx: Oropharynx is clear.  Eyes:     Extraocular Movements: Extraocular movements intact.     Pupils: Pupils are equal, round, and reactive to light.  Cardiovascular:     Rate and Rhythm: Normal rate and regular rhythm.     Pulses: Normal pulses.     Heart sounds: Normal heart sounds.  Pulmonary:     Effort: Pulmonary effort is normal.     Breath sounds: Normal breath sounds. No rales.  Musculoskeletal:     Cervical back: No tenderness.     Right lower leg: No edema.     Left lower leg: No edema.  Lymphadenopathy:     Cervical: No cervical adenopathy.  Skin:    Capillary Refill: Capillary refill takes less than 2 seconds.  Neurological:     Mental Status: She is alert and oriented to person,  place, and time.  Psychiatric:        Mood and Affect: Mood normal.        Behavior: Behavior normal.      ASSESSMENT & PLAN: A total of 44 minutes was spent with the patient and counseling/coordination of care regarding preparing for this visit, review of most recent office visit notes, review of most recent blood work results, review of multiple chronic medical conditions and their management, education on nutrition, review of all medications, prognosis, documentation, and need for follow-up.  Problem List Items Addressed This Visit       Cardiovascular and Mediastinum   Essential hypertension    BP Readings from Last 3 Encounters:  03/20/23 126/84  03/19/23 (!) 142/80  01/11/23 130/64  Well-controlled hypertension Continue losartan 100 mg daily       Chronic diastolic CHF (congestive heart failure) (HCC)    Clinically euvolemic. Not retaining fluid. Stable.  No signs of acute CHF      Atherosclerosis of aorta (HCC)    Stable condition.  Continues atorvastatin 40 mg daily      Relevant Medications   Semaglutide-Weight Management 0.25 MG/0.5ML SOAJ     Respiratory   Chronic respiratory failure with hypoxia (HCC)    Stable on chronic oxygen therapy Continues Breztri 2 puffs 2 times a day        Genitourinary   Stage 3a chronic kidney disease (HCC)    Stable.  Advised to stay well-hydrated and avoid NSAIDs        Other   History of myocardial infarction    Stable.  No recent anginal episodes. Continues daily baby aspirin and beta-blocker with carvedilol 25 mg twice a day      Relevant Medications   Semaglutide-Weight Management 0.25 MG/0.5ML SOAJ   Class 1 obesity due to excess calories with serious comorbidity and body mass index (BMI) of 30.0 to 30.9 in adult - Primary    Diet and nutrition discussed. Has history of cardiovascular disease. Wants to get started on semaglutide Recommend to start Wegovy 0.25 mg weekly and titrate up as tolerated Advised  to decrease amount of daily carbohydrate intake and daily calories and increase amount of plant-based protein in her diet.      Relevant Medications   Semaglutide-Weight Management 0.25 MG/0.5ML SOAJ   Patient Instructions  Health Maintenance After Age 71 After age 95, you are at a higher risk for certain long-term diseases and infections as well as injuries from falls. Falls are a major cause of broken bones and head injuries in people who are older than age 88. Getting regular preventive care can help to keep you healthy and  well. Preventive care includes getting regular testing and making lifestyle changes as recommended by your health care provider. Talk with your health care provider about: Which screenings and tests you should have. A screening is a test that checks for a disease when you have no symptoms. A diet and exercise plan that is right for you. What should I know about screenings and tests to prevent falls? Screening and testing are the best ways to find a health problem early. Early diagnosis and treatment give you the best chance of managing medical conditions that are common after age 28. Certain conditions and lifestyle choices may make you more likely to have a fall. Your health care provider may recommend: Regular vision checks. Poor vision and conditions such as cataracts can make you more likely to have a fall. If you wear glasses, make sure to get your prescription updated if your vision changes. Medicine review. Work with your health care provider to regularly review all of the medicines you are taking, including over-the-counter medicines. Ask your health care provider about any side effects that may make you more likely to have a fall. Tell your health care provider if any medicines that you take make you feel dizzy or sleepy. Strength and balance checks. Your health care provider may recommend certain tests to check your strength and balance while standing, walking, or  changing positions. Foot health exam. Foot pain and numbness, as well as not wearing proper footwear, can make you more likely to have a fall. Screenings, including: Osteoporosis screening. Osteoporosis is a condition that causes the bones to get weaker and break more easily. Blood pressure screening. Blood pressure changes and medicines to control blood pressure can make you feel dizzy. Depression screening. You may be more likely to have a fall if you have a fear of falling, feel depressed, or feel unable to do activities that you used to do. Alcohol use screening. Using too much alcohol can affect your balance and may make you more likely to have a fall. Follow these instructions at home: Lifestyle Do not drink alcohol if: Your health care provider tells you not to drink. If you drink alcohol: Limit how much you have to: 0-1 drink a day for women. 0-2 drinks a day for men. Know how much alcohol is in your drink. In the [place] one drink equals one 12 oz bottle of beer (355 mL), one 5 oz glass of wine (148 mL), or one 1 oz glass of hard liquor (44 mL). Do not use any products that contain nicotine or tobacco. These products include cigarettes, chewing tobacco, and vaping devices, such as e-cigarettes. If you need help quitting, ask your health care provider. Activity  Follow a regular exercise program to stay fit. This will help you maintain your balance. Ask your health care provider what types of exercise are appropriate for you. If you need a cane or walker, use it as recommended by your health care provider. Wear supportive shoes that have nonskid soles. Safety  Remove any tripping hazards, such as rugs, cords, and clutter. Install safety equipment such as grab bars in bathrooms and safety rails on stairs. Keep rooms and walkways well-lit. General instructions Talk with your health care provider about your risks for falling. Tell your health care provider if: You fall. Be sure to  tell your health care provider about all falls, even ones that seem minor. You feel dizzy, tiredness (fatigue), or off-balance. Take over-the-counter and prescription medicines only as told by your  health care provider. These include supplements. Eat a healthy diet and maintain a healthy weight. A healthy diet includes low-fat dairy products, low-fat (lean) meats, and fiber from whole grains, beans, and lots of fruits and vegetables. Stay current with your vaccines. Schedule regular health, dental, and eye exams. Summary Having a healthy lifestyle and getting preventive care can help to protect your health and wellness after age 44. Screening and testing are the best way to find a health problem early and help you avoid having a fall. Early diagnosis and treatment give you the best chance for managing medical conditions that are more common for people who are older than age 34. Falls are a major cause of broken bones and head injuries in people who are older than age 11. Take precautions to prevent a fall at home. Work with your health care provider to learn what changes you can make to improve your health and wellness and to prevent falls. This information is not intended to replace advice given to you by your health care provider. Make sure you discuss any questions you have with your health care provider. Document Revised: 03/20/2021 Document Reviewed: 03/20/2021 Elsevier Patient Education  2023 Elsevier Inc.    Edwina Barth, MD [facility]

## 2023-03-20 NOTE — Assessment & Plan Note (Signed)
Stable on chronic oxygen therapy Continues Breztri 2 puffs 2 times a day

## 2023-03-20 NOTE — Assessment & Plan Note (Signed)
Stable.  No recent anginal episodes. Continues daily baby aspirin and beta-blocker with carvedilol 25 mg twice a day

## 2023-03-20 NOTE — Assessment & Plan Note (Signed)
BP Readings from Last 3 Encounters:  03/20/23 126/84  03/19/23 (!) 142/80  01/11/23 130/64  Well-controlled hypertension Continue losartan 100 mg daily

## 2023-03-20 NOTE — Patient Instructions (Signed)
Health Maintenance After Age 80 After age 80, you are at a higher risk for certain long-term diseases and infections as well as injuries from falls. Falls are a major cause of broken bones and head injuries in people who are older than age 80. Getting regular preventive care can help to keep you healthy and well. Preventive care includes getting regular testing and making lifestyle changes as recommended by your health care provider. Talk with your health care provider about: Which screenings and tests you should have. A screening is a test that checks for a disease when you have no symptoms. A diet and exercise plan that is right for you. What should I know about screenings and tests to prevent falls? Screening and testing are the best ways to find a health problem early. Early diagnosis and treatment give you the best chance of managing medical conditions that are common after age 80. Certain conditions and lifestyle choices may make you more likely to have a fall. Your health care provider may recommend: Regular vision checks. Poor vision and conditions such as cataracts can make you more likely to have a fall. If you wear glasses, make sure to get your prescription updated if your vision changes. Medicine review. Work with your health care provider to regularly review all of the medicines you are taking, including over-the-counter medicines. Ask your health care provider about any side effects that may make you more likely to have a fall. Tell your health care provider if any medicines that you take make you feel dizzy or sleepy. Strength and balance checks. Your health care provider may recommend certain tests to check your strength and balance while standing, walking, or changing positions. Foot health exam. Foot pain and numbness, as well as not wearing proper footwear, can make you more likely to have a fall. Screenings, including: Osteoporosis screening. Osteoporosis is a condition that causes  the bones to get weaker and break more easily. Blood pressure screening. Blood pressure changes and medicines to control blood pressure can make you feel dizzy. Depression screening. You may be more likely to have a fall if you have a fear of falling, feel depressed, or feel unable to do activities that you used to do. Alcohol use screening. Using too much alcohol can affect your balance and may make you more likely to have a fall. Follow these instructions at home: Lifestyle Do not drink alcohol if: Your health care provider tells you not to drink. If you drink alcohol: Limit how much you have to: 0-1 drink a day for women. 0-2 drinks a day for men. Know how much alcohol is in your drink. In the [place] one drink equals one 12 oz bottle of beer (355 mL), one 5 oz glass of wine (148 mL), or one 1 oz glass of hard liquor (44 mL). Do not use any products that contain nicotine or tobacco. These products include cigarettes, chewing tobacco, and vaping devices, such as e-cigarettes. If you need help quitting, ask your health care provider. Activity  Follow a regular exercise program to stay fit. This will help you maintain your balance. Ask your health care provider what types of exercise are appropriate for you. If you need a cane or walker, use it as recommended by your health care provider. Wear supportive shoes that have nonskid soles. Safety  Remove any tripping hazards, such as rugs, cords, and clutter. Install safety equipment such as grab bars in bathrooms and safety rails on stairs. Keep rooms and walkways   well-lit. General instructions Talk with your health care provider about your risks for falling. Tell your health care provider if: You fall. Be sure to tell your health care provider about all falls, even ones that seem minor. You feel dizzy, tiredness (fatigue), or off-balance. Take over-the-counter and prescription medicines only as told by your health care provider. These include  supplements. Eat a healthy diet and maintain a healthy weight. A healthy diet includes low-fat dairy products, low-fat (lean) meats, and fiber from whole grains, beans, and lots of fruits and vegetables. Stay current with your vaccines. Schedule regular health, dental, and eye exams. Summary Having a healthy lifestyle and getting preventive care can help to protect your health and wellness after age 80. Screening and testing are the best way to find a health problem early and help you avoid having a fall. Early diagnosis and treatment give you the best chance for managing medical conditions that are more common for people who are older than age 80. Falls are a major cause of broken bones and head injuries in people who are older than age 80. Take precautions to prevent a fall at home. Work with your health care provider to learn what changes you can make to improve your health and wellness and to prevent falls. This information is not intended to replace advice given to you by your health care provider. Make sure you discuss any questions you have with your health care provider. Document Revised: 03/20/2021 Document Reviewed: 03/20/2021 Elsevier Patient Education  2023 [employer]  

## 2023-03-20 NOTE — Assessment & Plan Note (Signed)
Diet and nutrition discussed. Has history of cardiovascular disease. Wants to get started on semaglutide Recommend to start Wegovy 0.25 mg weekly and titrate up as tolerated Advised to decrease amount of daily carbohydrate intake and daily calories and increase amount of plant-based protein in her diet.

## 2023-03-20 NOTE — Assessment & Plan Note (Signed)
Stable condition.  Continues atorvastatin 40 mg daily

## 2023-03-22 ENCOUNTER — Other Ambulatory Visit: Payer: Self-pay | Admitting: Nurse Practitioner

## 2023-03-22 DIAGNOSIS — R1013 Epigastric pain: Secondary | ICD-10-CM

## 2023-03-22 DIAGNOSIS — D649 Anemia, unspecified: Secondary | ICD-10-CM

## 2023-04-03 ENCOUNTER — Telehealth: Payer: Self-pay

## 2023-04-03 ENCOUNTER — Other Ambulatory Visit (HOSPITAL_COMMUNITY): Payer: Self-pay

## 2023-04-03 NOTE — Telephone Encounter (Signed)
Approval letter attached to charts

## 2023-04-03 NOTE — Telephone Encounter (Signed)
Medicare doesn't pay for weight loss drugs, submitted under ICD 10: I50.32 Chronic diastolic heart failure   Patient Advocate Encounter   Received notification from [facility] that prior authorization for [facility] is required.   PA submitted on 04/03/2023 Key [id number] Insurance [facility] Medicare Status is pending

## 2023-04-21 ENCOUNTER — Other Ambulatory Visit: Payer: Self-pay | Admitting: Emergency Medicine

## 2023-05-01 ENCOUNTER — Other Ambulatory Visit: Payer: Self-pay | Admitting: Emergency Medicine

## 2023-05-30 ENCOUNTER — Ambulatory Visit: Payer: Medicare Other

## 2023-05-30 VITALS — Ht 63.5 in | Wt 180.0 lb

## 2023-05-30 DIAGNOSIS — Z0000 Encounter for general adult medical examination without abnormal findings: Secondary | ICD-10-CM

## 2023-05-30 NOTE — Progress Notes (Addendum)
Subjective:   Tammy Ball is a 82 y.o. female who presents for Medicare Annual (Subsequent) preventive examination.  Visit Complete: Virtual  I connected with  Tammy Ball on 05/30/23 by a audio enabled telemedicine application and verified that I am speaking with the correct person using two identifiers.  Patient Location: Home  Provider Location: Office/Clinic  I discussed the limitations of evaluation and management by telemedicine. The patient expressed understanding and agreed to proceed.  Patient Medicare AWV questionnaire was completed by the patient on 05/26/2023; I have confirmed that all information answered by patient is correct and no changes since this date.  Per patient no change in vitals since last visit, unable to obtain new vitals due to telehealth visit  Review of Systems     Cardiac Risk Factors include: advanced age (>20men, >63 women);dyslipidemia;family history of premature cardiovascular disease;hypertension;obesity (BMI >30kg/m2);sedentary lifestyle     Objective:    Today's Vitals   05/30/23 0936 05/30/23 0943  Weight: 180 lb (81.6 kg)   Height: 5' 3.5" (1.613 m)   PainSc: 6  6   PainLoc: Leg    Body mass index is 31.39 kg/m.     05/30/2023    9:47 AM 12/18/2022    9:22 AM 11/15/2022    8:40 AM 06/10/2022   10:06 AM 06/09/2022    7:19 AM 05/28/2022    3:34 PM 12/25/2021    8:49 AM  Advanced Directives  Does Patient Have a Medical Advance Directive? Yes Yes Yes Yes Yes Yes Yes  Type of Estate agent of [place];Living will Living will Living will Healthcare Power of [place];Living will Healthcare Power of [place];Living will Healthcare Power of [place];Living will Living will  Does patient want to make changes to medical advance directive?    No - Patient declined     Copy of Healthcare Power of Attorney in Chart? No - copy requested  No - copy requested   No - copy requested   Would patient like information on creating a  medical advance directive?     No - Patient declined      Current Medications (verified) Outpatient Encounter Medications as of 05/30/2023  Medication Sig   acetaminophen (TYLENOL) 500 MG tablet Take 500 mg by mouth every 6 (six) hours as needed for headache or moderate pain (pain).   albuterol (PROVENTIL) (2.5 MG/3ML) 0.083% nebulizer solution Take 3 mLs (2.5 mg total) by nebulization every 6 (six) hours as needed for wheezing or shortness of breath.   albuterol (VENTOLIN HFA) 108 (90 Base) MCG/ACT inhaler Inhale 1-2 puffs into the lungs every 6 (six) hours as needed for wheezing or shortness of breath.   Ascorbic Acid (VITAMIN C) 1000 MG tablet Take 1,000 mg by mouth every morning.   aspirin EC 81 MG tablet Take 81 mg by mouth every morning.   atorvastatin (LIPITOR) 40 MG tablet TAKE 1 TABLET BY MOUTH EVERY DAY   Budeson-Glycopyrrol-Formoterol (BREZTRI AEROSPHERE) 160-9-4.8 MCG/ACT AERO Inhale 2 puffs into the lungs in the morning and at bedtime.   Calcium Carbonate Antacid (TUMS PO) Take 1 tablet by mouth 4 (four) times daily as needed (heartburn).   Carboxymethylcellulose Sodium (ARTIFICIAL TEARS OP) Place 1 drop into both eyes daily as needed (dry eyes).   carvedilol (COREG) 25 MG tablet TAKE 1 TABLET BY MOUTH TWICE A DAY   cholecalciferol (VITAMIN D3) 25 MCG (1000 UNIT) tablet Take 1,000 Units by mouth daily.   diphenhydrAMINE (BENADRYL) 25 mg capsule Take 25  mg by mouth at bedtime as needed for allergies.   Guaifenesin (MUCINEX MAXIMUM STRENGTH) 1200 MG TB12 Take 1,200 mg by mouth every morning.   losartan (COZAAR) 100 MG tablet TAKE 1 TABLET BY MOUTH EVERY DAY   Omega-3 Fatty Acids (FISH OIL) 1200 MG CAPS Take 1,200 mg by mouth every morning.   OXYGEN Inhale 2 L into the lungs as needed (to keep SATS at or above 90).   Semaglutide-Weight Management 0.25 MG/0.5ML SOAJ Inject 0.25 mg into the skin once a week.   sodium chloride (OCEAN) 0.65 % SOLN nasal spray Place 1 spray into both  nostrils at bedtime as needed for congestion.   traMADol (ULTRAM) 50 MG tablet Take 1 tablet (50 mg total) by mouth every 6 (six) hours as needed (BACK PAIN).   No facility-administered encounter medications on file as of 05/30/2023.    Allergies (verified) Bee venom, Amlodipine, Levofloxacin, Alendronate, and Hctz [hydrochlorothiazide]   History: Past Medical History:  Diagnosis Date   Anemia    was while doing chemo   Anxiety    Arthritis    Asthma    Chemotherapy-induced neuropathy (HCC) 11/01/2015   CHF (congestive heart failure) (HCC)    Claustrophobia    COPD (chronic obstructive pulmonary disease) (HCC)    Depression    Encounter for antineoplastic chemotherapy 02/09/2016   GERD (gastroesophageal reflux disease)    Headache    prior to menopause   Heart murmur    History of echocardiogram    Echo 2/17: EF 60-65%, grade 1 diastolic dysfunction, mild MR, trivial pericardial effusion   History of nuclear stress test    Myoview 2/17: no ischemia or scar, EF 79%; low risk   History of radiation therapy 10/30/16-11/06/16   Hyperlipidemia    Hypertension    Insomnia 05/16/2016   Myocardial infarction (HCC)    Non-small cell carcinoma of right lung, stage 2 (HCC) 10/03/2015   Oxygen deficiency    Pneumonia    Radiation 02/29/16-04/10/16   50.4 Gy to right central chest   Renal cell carcinoma (HCC)    L nephrectomy  in 2012   Past Surgical History:  Procedure Laterality Date   BACK SURGERY     cervical 1991   BIOPSY  11/15/2022   Procedure: BIOPSY;  Surgeon: Shellia Cleverly, DO;  Location: [facility];  Service: Gastroenterology;;   COLONOSCOPY WITH PROPOFOL N/A 11/15/2022   Procedure: COLONOSCOPY WITH PROPOFOL;  Surgeon: Shellia Cleverly, DO;  Location: [facility];  Service: Gastroenterology;  Laterality: N/A;   ESOPHAGOGASTRODUODENOSCOPY (EGD) WITH PROPOFOL N/A 11/15/2022   Procedure: ESOPHAGOGASTRODUODENOSCOPY (EGD) WITH PROPOFOL;  Surgeon: Shellia Cleverly, DO;   Location: [facility];  Service: Gastroenterology;  Laterality: N/A;   EYE SURGERY     IR GENERIC HISTORICAL  01/16/2017   IR RADIOLOGIST EVAL & MGMT 01/16/2017 [facility]   IR GENERIC HISTORICAL  01/31/2017   IR FLUORO GUIDED NEEDLE PLC ASPIRATION/INJECTION LOC 01/31/2017 Julieanne Cotton, MD [facility]   IR GENERIC HISTORICAL  01/31/2017   IR VERTEBROPLASTY CERV/THOR BX INC UNI/BIL INC/INJECT/IMAGING 01/31/2017 Julieanne Cotton, MD [facility]   IR GENERIC HISTORICAL  01/31/2017   IR VERTEBROPLASTY EA ADDL (T&LS) BX INC UNI/BIL INC INJECT/IMAGING 01/31/2017 Julieanne Cotton, MD [facility]   kidney cancer     LOBECTOMY Right    RUL   NEPHRECTOMY  2012   POLYPECTOMY  11/15/2022   Procedure: POLYPECTOMY;  Surgeon: Shellia Cleverly, DO;  Location: [facility];  Service: Gastroenterology;;  SPINE SURGERY     TUBAL LIGATION     VIDEO ASSISTED THORACOSCOPY (VATS)/WEDGE RESECTION Right 01/18/2016   Procedure: VIDEO ASSISTED THORACOSCOPY (VATS)/LUNG RESECTION, THOROCOTOMY, RIGHT UPPER LOBECTOMY, LYMPH NODE DISSECTION, PLACEMENT OF ON Q;  Surgeon: Delight Ovens, MD;  Location: [facility];  Service: Thoracic;  Laterality: Right;   VIDEO BRONCHOSCOPY Bilateral 09/20/2015   Procedure: VIDEO BRONCHOSCOPY WITHOUT FLUORO;  Surgeon: Oretha Milch, MD;  Location: [facility];  Service: Cardiopulmonary;  Laterality: Bilateral;   VIDEO BRONCHOSCOPY N/A 01/18/2016   Procedure: VIDEO BRONCHOSCOPY;  Surgeon: Delight Ovens, MD;  Location: [facility] OR;  Service: Thoracic;  Laterality: N/A;   Family History  Problem Relation Age of Onset   Cancer Mother        Bladder Cancer   Hypertension Mother    CAD Mother 25   Heart disease Sister    Breast cancer Sister    Birth defects Sister    Obesity Brother    Cancer Maternal Grandmother    Heart attack Daughter 15       s/p CABG   Glaucoma Daughter    Colon cancer Neg Hx    Colon polyps Neg Hx    Esophageal cancer Neg Hx    Rectal cancer Neg  Hx    Stomach cancer Neg Hx    Social History   Socioeconomic History   Marital status: Widowed    Spouse name: Not on file   Number of children: 4   Years of education: Not on file   Highest education level: Master's degree (e.g., MA, MS, MEng, MEd, MSW, MBA)  Occupational History   Not on file  Tobacco Use   Smoking status: Former    Current packs/day: 0.00    Average packs/day: 1 pack/day for 50.0 years (50.0 ttl pk-yrs)    Types: Cigarettes    Start date: 03/15/1961    Quit date: 03/16/2011    Years since quitting: 12.2   Smokeless tobacco: Never   Tobacco comments:    last 4-5 years of smoking, smoked 0.5 pack/day   Vaping Use   Vaping status: Never Used  Substance and Sexual Activity   Alcohol use: No    Alcohol/week: 0.0 standard drinks of alcohol   Drug use: No   Sexual activity: Not Currently  Other Topics Concern   Not on file  Social History Narrative   Marital status: divorced; not dating in 2019.      Children: 4 children; 3 grandchildren adult; 4 gg.      Lives: alone in house      Employment: part-time work; substance abuse counselor; [employer].      Tobacco: quit smoking 2012; smoked 45 years      Alcohol: none      Drugs: none      Exercise: none in 2019; due to LEFT sciatica.      ADLs: independent with ADLs; drives.       Advanced Directives: YES: HCPOA: Nicholas Martinez/son.  FULL CODE but no prolonged measures.      Occupation: Substance Abuse Administrator, [employer]   No exercise** Merged History Encounter **       ** Data from: 12/14/11 Enc Dept: [facility]       ** Data from: 12/17/11 Enc Dept: [facility]   Substance abuse counselor   Husband deceased   4 great grandchildren   Son works in same substance abuse counseling center  as patient   Social Determinants of Health   Financial Resource Strain: Low Risk  (05/30/2023)   Overall Financial Resource Strain (CARDIA)    Difficulty of Paying Living  Expenses: Not very hard  Recent Concern: Financial Resource Strain - Medium Risk (03/16/2023)   Overall Financial Resource Strain (CARDIA)    Difficulty of Paying Living Expenses: Somewhat hard  Food Insecurity: No Food Insecurity (05/30/2023)   Hunger Vital Sign    Worried About Running Out of Food in the Last Year: Never true    Ran Out of Food in the Last Year: Never true  Transportation Needs: No Transportation Needs (05/30/2023)   PRAPARE - Administrator, Civil Service (Medical): No    Lack of Transportation (Non-Medical): No  Physical Activity: Unknown (05/30/2023)   Exercise Vital Sign    Days of Exercise per Week: 3 days    Minutes of Exercise per Session: Patient declined  Recent Concern: Physical Activity - Inactive (03/16/2023)   Exercise Vital Sign    Days of Exercise per Week: 0 days    Minutes of Exercise per Session: 20 min  Stress: Patient Declined (05/30/2023)   [employer] of Occupational Health - Occupational Stress Questionnaire    Feeling of Stress : Patient declined  Social Connections: Unknown (05/30/2023)   Social Connection and Isolation Panel [NHANES]    Frequency of Communication with Friends and Family: More than three times a week    Frequency of Social Gatherings with Friends and Family: Three times a week    Attends Religious Services: Patient declined    Active Member of Clubs or Organizations: Patient declined    Attends Banker Meetings: Patient declined    Marital Status: Divorced    Tobacco Counseling Counseling given: Not Answered Tobacco comments: last 4-5 years of smoking, smoked 0.5 pack/day    Clinical Intake:  Pre-visit preparation completed: Yes  Pain : 0-10 Pain Score: 6  Pain Type: Chronic pain Pain Location: Leg Pain Orientation: Left     BMI - recorded: 31.39 Nutritional Status: BMI > 30  Obese Nutritional Risks: None Diabetes: No  How often do you need to have someone help you when you read  instructions, pamphlets, or other written materials from your doctor or pharmacy?: 1 - Never What is the last grade level you completed in school?: Master's Degree  Interpreter Needed?: No  Information entered by :: Susie Cassette, LPN.   Activities of Daily Living    05/30/2023    9:48 AM 05/26/2023    8:55 AM  In your present state of health, do you have any difficulty performing the following activities:  Hearing? 0 0  Vision? 1 1  Difficulty concentrating or making decisions? 0 0  Walking or climbing stairs? 1 1  Dressing or bathing? 0 0  Doing errands, shopping? 1 1  Preparing Food and eating ? N N  Using the Toilet? N N  In the past six months, have you accidently leaked urine? Y Y  Do you have problems with loss of bowel control? N N  Managing your Medications? N N  Managing your Finances? N N  Housekeeping or managing your Housekeeping? Y Y    Patient Care Team: Georgina Quint, MD as PCP - General (Internal Medicine) Maisie Fus, MD as PCP - Cardiology (Cardiology) Si Gaul, MD as Consulting Physician (Oncology) Delight Ovens, MD (Inactive) as Consulting Physician (Cardiothoracic Surgery) Nelson Chimes, MD as Consulting Physician (Ophthalmology) Toma Aran  C, RPH (Inactive) as Pharmacist Scientist, research (physical sciences))  Indicate any recent Medical Services you may have received from other than [facility] providers in the past year (date may be approximate).     Assessment:   This is a routine wellness examination for Shaneice.  Hearing/Vision screen Hearing Screening - Comments:: Patient denied any hearing difficulty.   No hearing aids.  Vision Screening - Comments:: Patient does wear corrective lenses/contacts.  Annual eye exam done by: Shari Prows, MD.   Dietary issues and exercise activities discussed:     Goals Addressed             This Visit's Progress    My healthcare goal for 2024 is to lose weight.        Depression Screen     05/30/2023    9:45 AM 03/20/2023   10:33 AM 09/20/2022    9:47 AM 06/19/2022    1:23 PM 06/19/2022    1:20 PM 05/28/2022    3:35 PM 09/06/2021    9:42 AM  PHQ 2/9 Scores  PHQ - 2 Score 1 0 0 1 1 0 0  PHQ- 9 Score 2   5       Fall Risk    05/30/2023    9:48 AM 05/26/2023    8:55 AM 03/20/2023   10:33 AM 09/20/2022    9:47 AM 06/19/2022    1:20 PM  Fall Risk   Falls in the past year? 0 0 0 0 0  Number falls in past yr: 0 0 0 0 0  Injury with Fall? 0 0 0 0 0  Risk for fall due to : No Fall Risks  No Fall Risks No Fall Risks No Fall Risks  Follow up Falls prevention discussed  Falls evaluation completed Falls evaluation completed Falls evaluation completed    MEDICARE RISK AT HOME:  Medicare Risk at Home - 05/30/23 0949     Any stairs in or around the home? Yes    If so, are there any without handrails? No    Home free of loose throw rugs in walkways, pet beds, electrical cords, etc? Yes    Adequate lighting in your home to reduce risk of falls? Yes    Life alert? No    Use of a cane, walker or w/c? Yes    Grab bars in the bathroom? Yes    Shower chair or bench in shower? No    Elevated toilet seat or a handicapped toilet? No             TIMED UP AND GO:  Was the test performed?  No    Cognitive Function:        05/30/2023    9:49 AM 05/28/2022    3:41 PM 03/29/2020    2:27 PM 11/26/2018   10:57 AM 11/13/2017    3:39 PM  6CIT Screen  What Year? 0 points 0 points 0 points 0 points 0 points  What month? 0 points 0 points 0 points 0 points 0 points  What time? 0 points 0 points 0 points 0 points 0 points  Count back from 20 0 points 0 points 0 points 0 points 0 points  Months in reverse 0 points 0 points 0 points 0 points 0 points  Repeat phrase 0 points 0 points 0 points 0 points 0 points  Total Score 0 points 0 points 0 points 0 points 0 points    Immunizations Immunization History  Administered Date(s) Administered   [employer]  Quad(high Dose 65+) 07/18/2019   Hepatitis B  11/12/1998   Influenza Split 09/12/2012   Influenza, High Dose Seasonal PF 08/23/2017, 08/23/2018   Influenza,inj,Quad PF,6+ Mos 07/18/2013   Influenza-Unspecified 07/29/2014, 07/23/2015, 08/25/2016, 07/28/2020, 08/21/2021   PFIZER(Purple Top)SARS-COV-2 Vaccination 12/24/2019, 01/18/2020, 05/07/2020, 03/23/2021, 08/21/2021   Pneumococcal Conjugate-13 07/29/2015   Pneumococcal Polysaccharide-23 11/12/2000, 10/23/2012, 08/23/2017   Pneumococcal-Unspecified 10/23/2012, 08/23/2017   Tdap 12/02/2013   Zoster, Live 12/13/2010    TDAP status: Up to date  Flu Vaccine status: Up to date  Pneumococcal vaccine status: Up to date  Covid-19 vaccine status: Completed vaccines  Qualifies for Shingles Vaccine? Yes   Zostavax completed Yes   Shingrix Completed?: No.    Education has been provided regarding the importance of this vaccine. Patient has been advised to call insurance company to determine out of pocket expense if they have not yet received this vaccine. Advised may also receive vaccine at local pharmacy or Health Dept. Verbalized acceptance and understanding.  Screening Tests Health Maintenance  Topic Date Due   Hepatitis C Screening  Never done   COVID-19 Vaccine (6 - 2023-24 season) 07/13/2022   Zoster Vaccines- Shingrix (1 of 2) 06/20/2023 (Originally 09/18/1960)   INFLUENZA VACCINE  06/13/2023   DTaP/Tdap/Td (2 - Td or Tdap) 12/03/2023   Medicare Annual Wellness (AWV)  05/29/2024   Pneumonia Vaccine 30+ Years old  Completed   DEXA SCAN  Completed   HPV VACCINES  Aged Out   Colonoscopy  Discontinued    Health Maintenance  Health Maintenance Due  Topic Date Due   Hepatitis C Screening  Never done   COVID-19 Vaccine (6 - 2023-24 season) 07/13/2022    Colorectal cancer screening: No longer required.   Mammogram status: No longer required due to age/patient declined.  Bone Density scan: No longer required due to patient declined.  Lung Cancer Screening: (Low Dose CT  Chest recommended if Age 17-80 years, 20 pack-year currently smoking OR have quit w/in 15years.) does not qualify.   Lung Cancer Screening Referral: no  Additional Screening:  Hepatitis C Screening: does qualify; Completed: no  Vision Screening: Recommended annual ophthalmology exams for early detection of glaucoma and other disorders of the eye. Is the patient up to date with their annual eye exam?  Yes  Who is the provider or what is the name of the office in which the patient attends annual eye exams? Shari Prows, MD. If pt is not established with a provider, would they like to be referred to a provider to establish care? No .   Dental Screening: Recommended annual dental exams for proper oral hygiene  Diabetic Foot Exam: N/A  Community Resource Referral / Chronic Care Management: CRR required this visit?  No   CCM required this visit?  No     Plan:     I have personally reviewed and noted the following in the patient's chart:   Medical and social history Use of alcohol, tobacco or illicit drugs  Current medications and supplements including opioid prescriptions. Patient is not currently taking opioid prescriptions. Functional ability and status Nutritional status Physical activity Advanced directives List of other physicians Hospitalizations, surgeries, and ER visits in previous 12 months Vitals Screenings to include cognitive, depression, and falls Referrals and appointments  In addition, I have reviewed and discussed with patient certain preventive protocols, quality metrics, and best practice recommendations. A written personalized care plan for preventive services as well as general preventive health recommendations were provided to patient.  Mickeal Needy, LPN   1/61/0960   After Visit Summary: (Mail) Due to this being a telephonic visit, the after visit summary with patients personalized plan was offered to patient via mail   Nurse Notes: Normal  cognitive status assessed by direct observation via telephone conversation by this Nurse Health Advisor. No abnormalities found.

## 2023-05-30 NOTE — Patient Instructions (Addendum)
Tammy Ball , Thank you for taking time to come for your Medicare Wellness Visit. I appreciate your ongoing commitment to your health goals. Please review the following plan we discussed and let me know if I can assist you in the future.   These are the goals we discussed:  Goals      My healthcare goal for 2024 is to lose weight.        This is a list of the screening recommended for you and due dates:  Health Maintenance  Topic Date Due   Hepatitis C Screening  Never done   COVID-19 Vaccine (6 - 2023-24 season) 07/13/2022   Zoster (Shingles) Vaccine (1 of 2) 06/20/2023*   Flu Shot  06/13/2023   DTaP/Tdap/Td vaccine (2 - Td or Tdap) 12/03/2023   Medicare Annual Wellness Visit  05/29/2024   Pneumonia Vaccine  Completed   DEXA scan (bone density measurement)  Completed   HPV Vaccine  Aged Out   Colon Cancer Screening  Discontinued  *Topic was postponed. The date shown is not the original due date.    Advanced directives: Yes  Conditions/risks identified: Yes  Next appointment: It was nice speaking with you today!  Please follow up in one year for your annual wellness visit via telephone call with Nurse Percell Miller on 06/02/2024 at 9:45 a.m.  If you need to cancel or reschedule please call [phone].  Preventive Care 44 Years and Older, Female Preventive care refers to lifestyle choices and visits with your health care provider that can promote health and wellness. What does preventive care include? A yearly physical exam. This is also called an annual well check. Dental exams once or twice a year. Routine eye exams. Ask your health care provider how often you should have your eyes checked. Personal lifestyle choices, including: Daily care of your teeth and gums. Regular physical activity. Eating a healthy diet. Avoiding tobacco and drug use. Limiting alcohol use. Practicing safe sex. Taking low-dose aspirin every day. Taking vitamin and mineral supplements as recommended by  your health care provider. What happens during an annual well check? The services and screenings done by your health care provider during your annual well check will depend on your age, overall health, lifestyle risk factors, and family history of disease. Counseling  Your health care provider may ask you questions about your: Alcohol use. Tobacco use. Drug use. Emotional well-being. Home and relationship well-being. Sexual activity. Eating habits. History of falls. Memory and ability to understand (cognition). Work and work Astronomer. Reproductive health. Screening  You may have the following tests or measurements: Height, weight, and BMI. Blood pressure. Lipid and cholesterol levels. These may be checked every 5 years, or more frequently if you are over 8 years old. Skin check. Lung cancer screening. You may have this screening every year starting at age 80 if you have a 30-pack-year history of smoking and currently smoke or have quit within the past 15 years. Fecal occult blood test (FOBT) of the stool. You may have this test every year starting at age 80. Flexible sigmoidoscopy or colonoscopy. You may have a sigmoidoscopy every 5 years or a colonoscopy every 10 years starting at age 80. Hepatitis C blood test. Hepatitis B blood test. Sexually transmitted disease (STD) testing. Diabetes screening. This is done by checking your blood sugar (glucose) after you have not eaten for a while (fasting). You may have this done every 1-3 years. Bone density scan. This is done to screen for osteoporosis. You  may have this done starting at age 80. Mammogram. This may be done every 80 years. Talk to your health care provider about how often you should have regular mammograms. Talk with your health care provider about your test results, treatment options, and if necessary, the need for more tests. Vaccines  Your health care provider may recommend certain vaccines, such as: Influenza  vaccine. This is recommended every year. Tetanus, diphtheria, and acellular pertussis (Tdap, Td) vaccine. You may need a Td booster every 10 years. Zoster vaccine. You may need this after age 80. Pneumococcal 13-valent conjugate (PCV13) vaccine. One dose is recommended after age 80. Pneumococcal polysaccharide (PPSV23) vaccine. One dose is recommended after age 80. Talk to your health care provider about which screenings and vaccines you need and how often you need them. This information is not intended to replace advice given to you by your health care provider. Make sure you discuss any questions you have with your health care provider. Document Released: 11/25/2015 Document Revised: 07/18/2016 Document Reviewed: 08/30/2015 Elsevier Interactive Patient Education  2017 [employer].  Fall Prevention in the Home Falls can cause injuries. They can happen to people of all ages. There are many things you can do to make your home safe and to help prevent falls. What can I do on the outside of my home? Regularly fix the edges of walkways and driveways and fix any cracks. Remove anything that might make you trip as you walk through a door, such as a raised step or threshold. Trim any bushes or trees on the path to your home. Use bright outdoor lighting. Clear any walking paths of anything that might make someone trip, such as rocks or tools. Regularly check to see if handrails are loose or broken. Make sure that both sides of any steps have handrails. Any raised decks and porches should have guardrails on the edges. Have any leaves, snow, or ice cleared regularly. Use sand or salt on walking paths during winter. Clean up any spills in your garage right away. This includes oil or grease spills. What can I do in the bathroom? Use night lights. Install grab bars by the toilet and in the tub and shower. Do not use towel bars as grab bars. Use non-skid mats or decals in the tub or shower. If you  need to sit down in the shower, use a plastic, non-slip stool. Keep the floor dry. Clean up any water that spills on the floor as soon as it happens. Remove soap buildup in the tub or shower regularly. Attach bath mats securely with double-sided non-slip rug tape. Do not have throw rugs and other things on the floor that can make you trip. What can I do in the bedroom? Use night lights. Make sure that you have a light by your bed that is easy to reach. Do not use any sheets or blankets that are too big for your bed. They should not hang down onto the floor. Have a firm chair that has side arms. You can use this for support while you get dressed. Do not have throw rugs and other things on the floor that can make you trip. What can I do in the kitchen? Clean up any spills right away. Avoid walking on wet floors. Keep items that you use a lot in easy-to-reach places. If you need to reach something above you, use a strong step stool that has a grab bar. Keep electrical cords out of the way. Do not use floor  polish or wax that makes floors slippery. If you must use wax, use non-skid floor wax. Do not have throw rugs and other things on the floor that can make you trip. What can I do with my stairs? Do not leave any items on the stairs. Make sure that there are handrails on both sides of the stairs and use them. Fix handrails that are broken or loose. Make sure that handrails are as long as the stairways. Check any carpeting to make sure that it is firmly attached to the stairs. Fix any carpet that is loose or worn. Avoid having throw rugs at the top or bottom of the stairs. If you do have throw rugs, attach them to the floor with carpet tape. Make sure that you have a light switch at the top of the stairs and the bottom of the stairs. If you do not have them, ask someone to add them for you. What else can I do to help prevent falls? Wear shoes that: Do not have high heels. Have rubber  bottoms. Are comfortable and fit you well. Are closed at the toe. Do not wear sandals. If you use a stepladder: Make sure that it is fully opened. Do not climb a closed stepladder. Make sure that both sides of the stepladder are locked into place. Ask someone to hold it for you, if possible. Clearly mark and make sure that you can see: Any grab bars or handrails. First and last steps. Where the edge of each step is. Use tools that help you move around (mobility aids) if they are needed. These include: Canes. Walkers. Scooters. Crutches. Turn on the lights when you go into a dark area. Replace any light bulbs as soon as they burn out. Set up your furniture so you have a clear path. Avoid moving your furniture around. If any of your floors are uneven, fix them. If there are any pets around you, be aware of where they are. Review your medicines with your doctor. Some medicines can make you feel dizzy. This can increase your chance of falling. Ask your doctor what other things that you can do to help prevent falls. This information is not intended to replace advice given to you by your health care provider. Make sure you discuss any questions you have with your health care provider. Document Released: 08/25/2009 Document Revised: 04/05/2016 Document Reviewed: 12/03/2014 Elsevier Interactive Patient Education  2017 [employer].

## 2023-06-20 ENCOUNTER — Ambulatory Visit: Payer: Medicare Other | Admitting: Emergency Medicine

## 2023-07-01 DIAGNOSIS — Z23 Encounter for immunization: Secondary | ICD-10-CM | POA: Diagnosis not present

## 2023-07-17 ENCOUNTER — Other Ambulatory Visit: Payer: Self-pay | Admitting: Internal Medicine

## 2023-07-17 DIAGNOSIS — I214 Non-ST elevation (NSTEMI) myocardial infarction: Secondary | ICD-10-CM

## 2023-08-19 ENCOUNTER — Emergency Department (HOSPITAL_COMMUNITY)
Admission: EM | Admit: 2023-08-19 | Discharge: 2023-08-19 | Disposition: A | Discharge status: Home / Self Care | Payer: Medicare Other | Attending: Emergency Medicine | Admitting: Emergency Medicine

## 2023-08-19 ENCOUNTER — Encounter (HOSPITAL_COMMUNITY): Payer: Self-pay

## 2023-08-19 ENCOUNTER — Other Ambulatory Visit: Payer: Self-pay

## 2023-08-19 ENCOUNTER — Emergency Department (HOSPITAL_COMMUNITY): Payer: Medicare Other

## 2023-08-19 DIAGNOSIS — E860 Dehydration: Secondary | ICD-10-CM | POA: Diagnosis not present

## 2023-08-19 DIAGNOSIS — J449 Chronic obstructive pulmonary disease, unspecified: Secondary | ICD-10-CM | POA: Insufficient documentation

## 2023-08-19 DIAGNOSIS — R112 Nausea with vomiting, unspecified: Secondary | ICD-10-CM | POA: Diagnosis present

## 2023-08-19 DIAGNOSIS — Z7982 Long term (current) use of aspirin: Secondary | ICD-10-CM | POA: Insufficient documentation

## 2023-08-19 DIAGNOSIS — Z87891 Personal history of nicotine dependence: Secondary | ICD-10-CM | POA: Insufficient documentation

## 2023-08-19 DIAGNOSIS — K529 Noninfective gastroenteritis and colitis, unspecified: Secondary | ICD-10-CM | POA: Insufficient documentation

## 2023-08-19 DIAGNOSIS — R109 Unspecified abdominal pain: Secondary | ICD-10-CM | POA: Diagnosis not present

## 2023-08-19 DIAGNOSIS — I10 Essential (primary) hypertension: Secondary | ICD-10-CM | POA: Diagnosis not present

## 2023-08-19 DIAGNOSIS — Z905 Acquired absence of kidney: Secondary | ICD-10-CM | POA: Diagnosis not present

## 2023-08-19 LAB — CBC WITH DIFFERENTIAL/PLATELET
Abs Immature Granulocytes: 0.02 10*3/uL (ref 0.00–0.07)
Basophils Absolute: 0 10*3/uL (ref 0.0–0.1)
Basophils Relative: 0 %
Eosinophils Absolute: 0 10*3/uL (ref 0.0–0.5)
Eosinophils Relative: 0 %
HCT: 41.1 % (ref 36.0–46.0)
Hemoglobin: 13.4 g/dL (ref 12.0–15.0)
Immature Granulocytes: 0 %
Lymphocytes Relative: 10 %
Lymphs Abs: 0.8 10*3/uL (ref 0.7–4.0)
MCH: 31.8 pg (ref 26.0–34.0)
MCHC: 32.6 g/dL (ref 30.0–36.0)
MCV: 97.6 fL (ref 80.0–100.0)
Monocytes Absolute: 0.4 10*3/uL (ref 0.1–1.0)
Monocytes Relative: 5 %
Neutro Abs: 7.3 10*3/uL (ref 1.7–7.7)
Neutrophils Relative %: 85 %
Platelets: 262 10*3/uL (ref 150–400)
RBC: 4.21 MIL/uL (ref 3.87–5.11)
RDW: 15.3 % (ref 11.5–15.5)
WBC: 8.5 10*3/uL (ref 4.0–10.5)
nRBC: 0 % (ref 0.0–0.2)

## 2023-08-19 LAB — COMPREHENSIVE METABOLIC PANEL
ALT: 21 U/L (ref 0–44)
AST: 26 U/L (ref 15–41)
Albumin: 4.8 g/dL (ref 3.5–5.0)
Alkaline Phosphatase: 72 U/L (ref 38–126)
Anion gap: 12 (ref 5–15)
BUN: 30 mg/dL — ABNORMAL HIGH (ref 8–23)
CO2: 27 mmol/L (ref 22–32)
Calcium: 10.2 mg/dL (ref 8.9–10.3)
Chloride: 99 mmol/L (ref 98–111)
Creatinine, Ser: 1.23 mg/dL — ABNORMAL HIGH (ref 0.44–1.00)
GFR, Estimated: 45 mL/min — ABNORMAL LOW (ref >60)
Glucose, Bld: 136 mg/dL — ABNORMAL HIGH (ref 70–99)
Potassium: 4.6 mmol/L (ref 3.5–5.1)
Sodium: 138 mmol/L (ref 135–145)
Total Bilirubin: 0.7 mg/dL (ref 0.3–1.2)
Total Protein: 8.2 g/dL — ABNORMAL HIGH (ref 6.5–8.1)

## 2023-08-19 LAB — LIPASE, BLOOD: Lipase: 50 U/L (ref 11–51)

## 2023-08-19 MED ORDER — SODIUM CHLORIDE 0.9 % IV BOLUS
500.0000 mL | Freq: Once | INTRAVENOUS | Status: AC
  Administered 2023-08-19: 500 mL via INTRAVENOUS

## 2023-08-19 MED ORDER — HYDROMORPHONE HCL 1 MG/ML IJ SOLN
0.5000 mg | Freq: Once | INTRAMUSCULAR | Status: AC
  Administered 2023-08-19: 0.5 mg via INTRAVENOUS
  Filled 2023-08-19: qty 1

## 2023-08-19 MED ORDER — SODIUM CHLORIDE 0.9 % IV SOLN: INTRAVENOUS | Status: DC

## 2023-08-19 MED ORDER — IOHEXOL 300 MG/ML  SOLN
80.0000 mL | Freq: Once | INTRAMUSCULAR | Status: AC | PRN
  Administered 2023-08-19: 80 mL via INTRAVENOUS

## 2023-08-19 MED ORDER — LOPERAMIDE HCL 2 MG PO CAPS: 2.0000 mg | ORAL_CAPSULE | Freq: Four times a day (QID) | ORAL | 0 refills | Status: DC | PRN

## 2023-08-19 MED ORDER — ONDANSETRON 4 MG PO TBDP: 4.0000 mg | ORAL_TABLET | Freq: Three times a day (TID) | ORAL | 1 refills | Status: DC | PRN

## 2023-08-19 MED ORDER — ONDANSETRON HCL 4 MG/2ML IJ SOLN
4.0000 mg | Freq: Once | INTRAMUSCULAR | Status: AC
  Administered 2023-08-19: 4 mg via INTRAVENOUS
  Filled 2023-08-19: qty 2

## 2023-08-19 NOTE — ED Triage Notes (Signed)
Pt arrived reporting N/V/D since midnight. States has dinner yesterday at church and might have been spoiled. Endorsed generalized abdominal cramping. No other complaints

## 2023-08-19 NOTE — Discharge Instructions (Addendum)
Take Zofran dissolvable tablet as needed to prevent the nausea vomiting.  Take the Imodium A-D as needed for the diarrhea.  Return for development of fever vomiting or bowel movements with blood.  Make an appointment follow-up with your primary care doctor.  Workup here today without any acute findings.  Continue to hydrate yourself.

## 2023-08-19 NOTE — ED Provider Notes (Signed)
EMERGENCY DEPARTMENT AT [facility] Provider Note   CSN: [id number] Arrival date & time: 08/19/23  0759     History  Chief Complaint  Patient presents with   Abdominal Pain   Nausea   Diarrhea    Tammy Ball is a 82 y.o. female.  Patient with acute onset of nausea vomiting and diarrhea since shortly after midnight.  Patient concerned it may have been food poisoning.  Last vomited at 7 in the morning.  Patient normally on 2 L of oxygen at all times.  Patient states no blood in the vomit or the bowel movements.  Patient has a history of kidney cancer and lung cancer.  Has her left kidney removed in 2012.  And is still currently followed by Dr. Shirline Frees for right upper lobe removal for lung cancer.  Patient denies any fevers.  Patient has a history of COPD hypertension hyperlipidemia as mentioned non-small cell carcinoma of the right lung stage II in 2016.  Renal cell carcinoma with left nephrectomy in 2012.  And a history of coronary artery disease.  Patient is a former smoker quit in 2012.       Home Medications Prior to Admission medications   Medication Sig Start Date End Date Taking? Authorizing Provider  loperamide (IMODIUM) 2 MG capsule Take 1 capsule (2 mg total) by mouth 4 (four) times daily as needed for diarrhea or loose stools. 08/19/23  Yes Vanetta Mulders, MD  ondansetron (ZOFRAN-ODT) 4 MG disintegrating tablet Take 1 tablet (4 mg total) by mouth every 8 (eight) hours as needed for nausea or vomiting. 08/19/23  Yes Vanetta Mulders, MD  acetaminophen (TYLENOL) 500 MG tablet Take 500 mg by mouth every 6 (six) hours as needed for headache or moderate pain (pain).    [provider]  albuterol (PROVENTIL) (2.5 MG/3ML) 0.083% nebulizer solution Take 3 mLs (2.5 mg total) by nebulization every 6 (six) hours as needed for wheezing or shortness of breath. 01/09/22   Leslye Peer, MD  albuterol (VENTOLIN HFA) 108 (90 Base) MCG/ACT inhaler Inhale  1-2 puffs into the lungs every 6 (six) hours as needed for wheezing or shortness of breath. 10/25/21   Georgina Quint, MD  Ascorbic Acid (VITAMIN C) 1000 MG tablet Take 1,000 mg by mouth every morning.    [provider]  aspirin EC 81 MG tablet Take 81 mg by mouth every morning.    [provider]  atorvastatin (LIPITOR) 40 MG tablet TAKE 1 TABLET BY MOUTH EVERY DAY 04/22/23   Georgina Quint, MD  Budeson-Glycopyrrol-Formoterol (BREZTRI AEROSPHERE) 160-9-4.8 MCG/ACT AERO Inhale 2 puffs into the lungs in the morning and at bedtime. 01/18/23   Leslye Peer, MD  Calcium Carbonate Antacid (TUMS PO) Take 1 tablet by mouth 4 (four) times daily as needed (heartburn).    [provider]  Carboxymethylcellulose Sodium (ARTIFICIAL TEARS OP) Place 1 drop into both eyes daily as needed (dry eyes).    [provider]  carvedilol (COREG) 25 MG tablet TAKE 1 TABLET BY MOUTH TWICE A DAY 07/17/23   Maisie Fus, MD  cholecalciferol (VITAMIN D3) 25 MCG (1000 UNIT) tablet Take 1,000 Units by mouth daily.    [provider]  diphenhydrAMINE (BENADRYL) 25 mg capsule Take 25 mg by mouth at bedtime as needed for allergies.    [provider]  Guaifenesin (MUCINEX MAXIMUM STRENGTH) 1200 MG TB12 Take 1,200 mg by mouth every morning.    [provider]  losartan (COZAAR) 100 MG tablet TAKE 1 TABLET BY MOUTH EVERY DAY 05/01/23   Georgina Quint, MD  Omega-3 Fatty Acids (FISH OIL) 1200 MG CAPS Take 1,200 mg by mouth every morning.    [provider]  OXYGEN Inhale 2 L into the lungs as needed (to keep SATS at or above 90).    [provider]  Semaglutide-Weight Management 0.25 MG/0.5ML SOAJ Inject 0.25 mg into the skin once a week. 03/20/23   Georgina Quint, MD  sodium chloride (OCEAN) 0.65 % SOLN nasal spray Place 1 spray into both nostrils at bedtime as needed for congestion.    [provider]  traMADol (ULTRAM)  50 MG tablet Take 1 tablet (50 mg total) by mouth every 6 (six) hours as needed (BACK PAIN). 09/06/21   Georgina Quint, MD      Allergies    Bee venom, Amlodipine, Levofloxacin, Alendronate, and Hctz [hydrochlorothiazide]    Review of Systems   Review of Systems  Constitutional:  Negative for chills and fever.  HENT:  Negative for ear pain and sore throat.   Eyes:  Negative for pain and visual disturbance.  Respiratory:  Negative for cough and shortness of breath.   Cardiovascular:  Negative for chest pain and palpitations.  Gastrointestinal:  Positive for abdominal pain, diarrhea, nausea and vomiting. Negative for blood in stool.  Genitourinary:  Negative for dysuria and hematuria.  Musculoskeletal:  Negative for arthralgias and back pain.  Skin:  Negative for color change and rash.  Neurological:  Negative for seizures and syncope.  All other systems reviewed and are negative.   Physical Exam Updated Vital Signs BP 123/62 (BP Location: Right Arm)   Pulse 75   Temp 98.1 F (36.7 C)   Resp 17   Ht 1.626 m (5\' 4" )   Wt 81.6 kg   SpO2 96%   BMI 30.88 kg/m  Physical Exam Vitals and nursing note reviewed.  Constitutional:      General: She is not in acute distress.    Appearance: Normal appearance. She is well-developed.  HENT:     Head: Normocephalic and atraumatic.     Mouth/Throat:     Mouth: Mucous membranes are dry.  Eyes:     Extraocular Movements: Extraocular movements intact.     Conjunctiva/sclera: Conjunctivae normal.     Pupils: Pupils are equal, round, and reactive to light.  Cardiovascular:     Rate and Rhythm: Normal rate and regular rhythm.     Heart sounds: No murmur heard. Pulmonary:     Effort: Pulmonary effort is normal. No respiratory distress.     Breath sounds: Normal breath sounds.  Abdominal:     General: There is no distension.     Palpations: Abdomen is soft.     Tenderness: There is no abdominal tenderness. There is no guarding.   Musculoskeletal:        General: No swelling.     Cervical back: Normal range of motion and neck supple.  Skin:    General: Skin is warm and dry.     Capillary Refill: Capillary refill takes less than 2 seconds.  Neurological:     General: No focal deficit present.     Mental Status: She is alert and oriented to person, place, and time.  Psychiatric:        Mood and Affect: Mood normal.     ED Results / Procedures / Treatments   Labs (all labs ordered are listed, but  only abnormal results are displayed) Labs Reviewed  COMPREHENSIVE METABOLIC PANEL - Abnormal; Notable for the following components:      Result Value   Glucose, Bld 136 (*)    BUN 30 (*)    Creatinine, Ser 1.23 (*)    Total Protein 8.2 (*)    GFR, Estimated 45 (*)    All other components within normal limits  CBC WITH DIFFERENTIAL/PLATELET  LIPASE, BLOOD    EKG EKG Interpretation Date/Time:  Monday August 19 2023 09:31:59 EDT Ventricular Rate:  88 PR Interval:  151 QRS Duration:  87 QT Interval:  369 QTC Calculation: 447 R Axis:   249  Text Interpretation: Sinus rhythm LAD, consider left anterior fascicular block RSR' in V1 or V2, right VCD or RVH Confirmed by Vanetta Mulders [phone]) on 08/19/2023 9:39:37 AM  Radiology CT ABDOMEN PELVIS W CONTRAST  Result Date: 08/19/2023 CLINICAL DATA:  Abdominal pain. EXAM: CT ABDOMEN AND PELVIS WITH CONTRAST TECHNIQUE: Multidetector CT imaging of the abdomen and pelvis was performed using the standard protocol following bolus administration of intravenous contrast. RADIATION DOSE REDUCTION: This exam was performed according to the departmental dose-optimization program which includes automated exposure control, adjustment of the mA and/or kV according to patient size and/or use of iterative reconstruction technique. CONTRAST:  80mL OMNIPAQUE IOHEXOL 300 MG/ML  SOLN COMPARISON:  06/09/2022 FINDINGS: Lower chest: Stable nodular scarring at the left lung base.  Hepatobiliary: No focal liver abnormality is seen. No gallstones, gallbladder wall thickening, or biliary dilatation. Pancreas: Unremarkable. No pancreatic ductal dilatation or surrounding inflammatory changes. Spleen: Normal in size without focal abnormality. Adrenals/Urinary Tract: Adrenal glands are unremarkable. Left nephrectomy. Right kidney is normal, without renal calculi, focal lesion, or hydronephrosis. Bladder is unremarkable. Stomach/Bowel: Stomach is fluid filled with multiple fluid-filled loops of nondilated small bowel as can be seen with gastroenteritis. No evidence of bowel wall thickening, distention, or inflammatory changes. Appendix is normal. Vascular/Lymphatic: Aortic atherosclerosis. No enlarged abdominal or pelvic lymph nodes. Reproductive: Uterus and bilateral adnexa are unremarkable. Other: No abdominal wall hernia or abnormality. No abdominopelvic ascites. Musculoskeletal: No acute osseous abnormality. No aggressive osseous lesion. Severe osteoarthritis of the left hip. Mild osteoarthritis of the right hip. Grade 1 anterolisthesis of L3 on L4 and L4 on L5 secondary to facet disease. Bilateral facet arthropathy at L3-4, L4-5 and L5-S1. IMPRESSION: 1. Stomach is fluid filled with multiple fluid-filled loops of nondilated small bowel as can be seen with gastroenteritis. 2. Left nephrectomy. 3.  Aortic Atherosclerosis (ICD10-I70.0). Electronically Signed   By: Elige Ko M.D.   On: 08/19/2023 12:21    Procedures Procedures    Medications Ordered in ED Medications  0.9 %  sodium chloride infusion ( Intravenous New Bag/Given 08/19/23 0922)  sodium chloride 0.9 % bolus 500 mL (500 mLs Intravenous New Bag/Given 08/19/23 0922)  ondansetron (ZOFRAN) injection 4 mg (4 mg Intravenous Given 08/19/23 0913)  HYDROmorphone (DILAUDID) injection 0.5 mg (0.5 mg Intravenous Given 08/19/23 0913)  iohexol (OMNIPAQUE) 300 MG/ML solution 80 mL (80 mLs Intravenous Contrast Given 08/19/23 1017)    ED  Course/ Medical Decision Making/ A&P                                 Medical Decision Making Amount and/or Complexity of Data Reviewed Labs: ordered. Radiology: ordered.  Risk Prescription drug management.   Patient hemodynamically stable.  CBC no leukocytosis hemoglobin 13.4.  Platelets are 262.  Complete metabolic  panel GFR is down some of 45 her creatinine is 1.23.  LFTs normal.  Lipase normal.  CT scan of the abdomen consistent with a gastroenteritis.  No other acute findings.  Patient received IV fluids here.  She did appear clinically a little dehydrated.  It appears that patient's renal function on based on GFR's have been a little better than this despite the left nephrectomy.  Patient was treated with Zofran and Imodium A-D and follow-up with her primary care doctor with precautions to return for vomiting any blood or blood in her bowel movements or developing fevers.   Final Clinical Impression(s) / ED Diagnoses Final diagnoses:  Dehydration  Gastroenteritis    Rx / DC Orders ED Discharge Orders          Ordered    ondansetron (ZOFRAN-ODT) 4 MG disintegrating tablet  Every 8 hours PRN        08/19/23 1308    loperamide (IMODIUM) 2 MG capsule  4 times daily PRN        08/19/23 1308              Vanetta Mulders, MD 08/19/23 1314

## 2023-08-19 NOTE — ED Notes (Signed)
Pt discharged home. Discharge information discussed. No s/s of distress observed during discharge. 

## 2023-08-22 ENCOUNTER — Ambulatory Visit: Payer: Medicare Other | Admitting: Emergency Medicine

## 2023-08-26 ENCOUNTER — Ambulatory Visit (INDEPENDENT_AMBULATORY_CARE_PROVIDER_SITE_OTHER): Payer: Medicare Other | Admitting: Emergency Medicine

## 2023-08-26 ENCOUNTER — Encounter: Payer: Self-pay | Admitting: Emergency Medicine

## 2023-08-26 VITALS — BP 128/84 | HR 63 | Temp 98.5°F | Ht 64.0 in | Wt 170.0 lb

## 2023-08-26 DIAGNOSIS — C642 Malignant neoplasm of left kidney, except renal pelvis: Secondary | ICD-10-CM | POA: Diagnosis not present

## 2023-08-26 DIAGNOSIS — J449 Chronic obstructive pulmonary disease, unspecified: Secondary | ICD-10-CM

## 2023-08-26 DIAGNOSIS — I700 Atherosclerosis of aorta: Secondary | ICD-10-CM

## 2023-08-26 DIAGNOSIS — C3491 Malignant neoplasm of unspecified part of right bronchus or lung: Secondary | ICD-10-CM

## 2023-08-26 DIAGNOSIS — K219 Gastro-esophageal reflux disease without esophagitis: Secondary | ICD-10-CM

## 2023-08-26 DIAGNOSIS — T451X5A Adverse effect of antineoplastic and immunosuppressive drugs, initial encounter: Secondary | ICD-10-CM | POA: Diagnosis not present

## 2023-08-26 DIAGNOSIS — N1831 Chronic kidney disease, stage 3a: Secondary | ICD-10-CM

## 2023-08-26 DIAGNOSIS — R1013 Epigastric pain: Secondary | ICD-10-CM

## 2023-08-26 DIAGNOSIS — G620 Drug-induced polyneuropathy: Secondary | ICD-10-CM

## 2023-08-26 DIAGNOSIS — I10 Essential (primary) hypertension: Secondary | ICD-10-CM | POA: Diagnosis not present

## 2023-08-26 DIAGNOSIS — J9611 Chronic respiratory failure with hypoxia: Secondary | ICD-10-CM | POA: Diagnosis not present

## 2023-08-26 LAB — CBC WITH DIFFERENTIAL/PLATELET
Basophils Absolute: 0.1 10*3/uL (ref 0.0–0.1)
Basophils Relative: 1.1 % (ref 0.0–3.0)
Eosinophils Absolute: 0.2 10*3/uL (ref 0.0–0.7)
Eosinophils Relative: 3 % (ref 0.0–5.0)
HCT: 36.2 % (ref 36.0–46.0)
Hemoglobin: 12 g/dL (ref 12.0–15.0)
Lymphocytes Relative: 22.4 % (ref 12.0–46.0)
Lymphs Abs: 1.4 10*3/uL (ref 0.7–4.0)
MCHC: 33.1 g/dL (ref 30.0–36.0)
MCV: 95.2 fL (ref 78.0–100.0)
Monocytes Absolute: 0.6 10*3/uL (ref 0.1–1.0)
Monocytes Relative: 9.8 % (ref 3.0–12.0)
Neutro Abs: 3.9 10*3/uL (ref 1.4–7.7)
Neutrophils Relative %: 63.7 % (ref 43.0–77.0)
Platelets: 251 10*3/uL (ref 150.0–400.0)
RBC: 3.8 Mil/uL — ABNORMAL LOW (ref 3.87–5.11)
RDW: 15.8 % — ABNORMAL HIGH (ref 11.5–15.5)
WBC: 6.1 10*3/uL (ref 4.0–10.5)

## 2023-08-26 LAB — COMPREHENSIVE METABOLIC PANEL
ALT: 15 U/L (ref 0–35)
AST: 19 U/L (ref 0–37)
Albumin: 4.1 g/dL (ref 3.5–5.2)
Alkaline Phosphatase: 58 U/L (ref 39–117)
BUN: 13 mg/dL (ref 6–23)
CO2: 34 meq/L — ABNORMAL HIGH (ref 19–32)
Calcium: 9.8 mg/dL (ref 8.4–10.5)
Chloride: 96 meq/L (ref 96–112)
Creatinine, Ser: 1.16 mg/dL (ref 0.40–1.20)
GFR: 44.71 mL/min — ABNORMAL LOW (ref >60.00)
Glucose, Bld: 95 mg/dL (ref 70–99)
Potassium: 4.3 meq/L (ref 3.5–5.1)
Sodium: 136 meq/L (ref 135–145)
Total Bilirubin: 0.7 mg/dL (ref 0.2–1.2)
Total Protein: 6.5 g/dL (ref 6.0–8.3)

## 2023-08-26 MED ORDER — PANTOPRAZOLE SODIUM 40 MG PO TBEC
40.0000 mg | DELAYED_RELEASE_TABLET | Freq: Every day | ORAL | 1 refills | Status: DC
Start: 2023-08-26 — End: 2024-06-02

## 2023-08-26 NOTE — Assessment & Plan Note (Signed)
Stable chronic condition.  No concerns.

## 2023-08-26 NOTE — Assessment & Plan Note (Signed)
Chronic stable condition.  Sees oncologist on a regular basis No complications

## 2023-08-26 NOTE — Assessment & Plan Note (Signed)
BP Readings from Last 3 Encounters:  08/26/23 128/84  08/19/23 132/74  03/20/23 126/84  Well-controlled hypertension Continue carvedilol 25 mg twice daily and losartan 100 mg daily

## 2023-08-26 NOTE — Assessment & Plan Note (Signed)
Stable.  Takes tramadol 50 mg every 6 hours as needed

## 2023-08-26 NOTE — Assessment & Plan Note (Signed)
Oxygen dependent 24/7

## 2023-08-26 NOTE — Assessment & Plan Note (Signed)
Continue atorvastatin 40 mg daily.

## 2023-08-26 NOTE — Patient Instructions (Signed)
Health Maintenance After Age 80 After age 80, you are at a higher risk for certain long-term diseases and infections as well as injuries from falls. Falls are a major cause of broken bones and head injuries in people who are older than age 80. Getting regular preventive care can help to keep you healthy and well. Preventive care includes getting regular testing and making lifestyle changes as recommended by your health care provider. Talk with your health care provider about: Which screenings and tests you should have. A screening is a test that checks for a disease when you have no symptoms. A diet and exercise plan that is right for you. What should I know about screenings and tests to prevent falls? Screening and testing are the best ways to find a health problem early. Early diagnosis and treatment give you the best chance of managing medical conditions that are common after age 80. Certain conditions and lifestyle choices may make you more likely to have a fall. Your health care provider may recommend: Regular vision checks. Poor vision and conditions such as cataracts can make you more likely to have a fall. If you wear glasses, make sure to get your prescription updated if your vision changes. Medicine review. Work with your health care provider to regularly review all of the medicines you are taking, including over-the-counter medicines. Ask your health care provider about any side effects that may make you more likely to have a fall. Tell your health care provider if any medicines that you take make you feel dizzy or sleepy. Strength and balance checks. Your health care provider may recommend certain tests to check your strength and balance while standing, walking, or changing positions. Foot health exam. Foot pain and numbness, as well as not wearing proper footwear, can make you more likely to have a fall. Screenings, including: Osteoporosis screening. Osteoporosis is a condition that causes  the bones to get weaker and break more easily. Blood pressure screening. Blood pressure changes and medicines to control blood pressure can make you feel dizzy. Depression screening. You may be more likely to have a fall if you have a fear of falling, feel depressed, or feel unable to do activities that you used to do. Alcohol use screening. Using too much alcohol can affect your balance and may make you more likely to have a fall. Follow these instructions at home: Lifestyle Do not drink alcohol if: Your health care provider tells you not to drink. If you drink alcohol: Limit how much you have to: 0-1 drink a day for women. 0-2 drinks a day for men. Know how much alcohol is in your drink. In the [place] one drink equals one 12 oz bottle of beer (355 mL), one 5 oz glass of wine (148 mL), or one 1 oz glass of hard liquor (44 mL). Do not use any products that contain nicotine or tobacco. These products include cigarettes, chewing tobacco, and vaping devices, such as e-cigarettes. If you need help quitting, ask your health care provider. Activity  Follow a regular exercise program to stay fit. This will help you maintain your balance. Ask your health care provider what types of exercise are appropriate for you. If you need a cane or walker, use it as recommended by your health care provider. Wear supportive shoes that have nonskid soles. Safety  Remove any tripping hazards, such as rugs, cords, and clutter. Install safety equipment such as grab bars in bathrooms and safety rails on stairs. Keep rooms and walkways   well-lit. General instructions Talk with your health care provider about your risks for falling. Tell your health care provider if: You fall. Be sure to tell your health care provider about all falls, even ones that seem minor. You feel dizzy, tiredness (fatigue), or off-balance. Take over-the-counter and prescription medicines only as told by your health care provider. These include  supplements. Eat a healthy diet and maintain a healthy weight. A healthy diet includes low-fat dairy products, low-fat (lean) meats, and fiber from whole grains, beans, and lots of fruits and vegetables. Stay current with your vaccines. Schedule regular health, dental, and eye exams. Summary Having a healthy lifestyle and getting preventive care can help to protect your health and wellness after age 80. Screening and testing are the best way to find a health problem early and help you avoid having a fall. Early diagnosis and treatment give you the best chance for managing medical conditions that are more common for people who are older than age 80. Falls are a major cause of broken bones and head injuries in people who are older than age 80. Take precautions to prevent a fall at home. Work with your health care provider to learn what changes you can make to improve your health and wellness and to prevent falls. This information is not intended to replace advice given to you by your health care provider. Make sure you discuss any questions you have with your health care provider. Document Revised: 03/20/2021 Document Reviewed: 03/20/2021 [employer] Patient Education  2024 [employer]  

## 2023-08-26 NOTE — Assessment & Plan Note (Signed)
Exacerbated by recent infection i.e. gastroenteritis Recommend to start pantoprazole 40 mg daily for 2 weeks

## 2023-08-26 NOTE — Assessment & Plan Note (Signed)
Advised to stay well-hydrated and avoid NSAIDs. ?

## 2023-08-26 NOTE — Assessment & Plan Note (Signed)
Related to recent bout of gastroenteritis Overall feeling better but still having occasional mild nausea Able to eat and drink.  Zofran ODT helps Recommend to start pantoprazole 40 mg daily for 2 weeks Clinically stable.  No red flag signs or symptoms. Advised to stay well-hydrated Diet and nutrition discussed

## 2023-08-26 NOTE — Progress Notes (Signed)
Tammy Ball 82 y.o.   Chief Complaint  Patient presents with   Follow-up    Patient was seen in ED, dehydration, food poisoning, nausea. Patient states she still have some nausea.     HISTORY OF PRESENT ILLNESS: This is a 82 y.o. female here for 4 emergency department visit follow-up from 08/19/2023 when she presented with nausea vomiting and diarrhea and was found to be dehydrated.  Doing much better.  Feels at least 75% better.  Still a little nauseous.  Zofran ODT working well.  No longer having diarrhea.  Some epigastric residual pain Multiple medical conditions.  No changes in medications. No other complaints or medical concerns today.  HPI   Prior to Admission medications   Medication Sig Start Date End Date Taking? Authorizing Provider  acetaminophen (TYLENOL) 500 MG tablet Take 500 mg by mouth every 6 (six) hours as needed for headache or moderate pain (pain).   Yes [provider]  albuterol (PROVENTIL) (2.5 MG/3ML) 0.083% nebulizer solution Take 3 mLs (2.5 mg total) by nebulization every 6 (six) hours as needed for wheezing or shortness of breath. 01/09/22  Yes Leslye Peer, MD  albuterol (VENTOLIN HFA) 108 (90 Base) MCG/ACT inhaler Inhale 1-2 puffs into the lungs every 6 (six) hours as needed for wheezing or shortness of breath. 10/25/21  Yes Jakim Drapeau, Eilleen Kempf, MD  Ascorbic Acid (VITAMIN C) 1000 MG tablet Take 1,000 mg by mouth every morning.   Yes [provider]  aspirin EC 81 MG tablet Take 81 mg by mouth every morning.   Yes [provider]  atorvastatin (LIPITOR) 40 MG tablet TAKE 1 TABLET BY MOUTH EVERY DAY 04/22/23  Yes Payten Beaumier, Eilleen Kempf, MD  Budeson-Glycopyrrol-Formoterol (BREZTRI AEROSPHERE) 160-9-4.8 MCG/ACT AERO Inhale 2 puffs into the lungs in the morning and at bedtime. 01/18/23  Yes Leslye Peer, MD  Calcium Carbonate Antacid (TUMS PO) Take 1 tablet by mouth 4 (four) times daily as needed (heartburn).   Yes [provider]  Carboxymethylcellulose Sodium (ARTIFICIAL TEARS OP) Place 1 drop into both eyes daily as needed (dry eyes).   Yes [provider]  carvedilol (COREG) 25 MG tablet TAKE 1 TABLET BY MOUTH TWICE A DAY 07/17/23  Yes Branch, Alben Spittle, MD  cholecalciferol (VITAMIN D3) 25 MCG (1000 UNIT) tablet Take 1,000 Units by mouth daily.   Yes [provider]  diphenhydrAMINE (BENADRYL) 25 mg capsule Take 25 mg by mouth at bedtime as needed for allergies.   Yes [provider]  Guaifenesin (MUCINEX MAXIMUM STRENGTH) 1200 MG TB12 Take 1,200 mg by mouth every morning.   Yes [provider]  loperamide (IMODIUM) 2 MG capsule Take 1 capsule (2 mg total) by mouth 4 (four) times daily as needed for diarrhea or loose stools. 08/19/23  Yes Vanetta Mulders, MD  losartan (COZAAR) 100 MG tablet TAKE 1 TABLET BY MOUTH EVERY DAY 05/01/23  Yes Elis Rawlinson, Eilleen Kempf, MD  Omega-3 Fatty Acids (FISH OIL) 1200 MG CAPS Take 1,200 mg by mouth every morning.   Yes [provider]  ondansetron (ZOFRAN-ODT) 4 MG disintegrating tablet Take 1 tablet (4 mg total) by mouth every 8 (eight) hours as needed for nausea or vomiting. 08/19/23  Yes Vanetta Mulders, MD  OXYGEN Inhale 2 L into the lungs as needed (to keep SATS at or above 90).   Yes [provider]  sodium chloride (OCEAN) 0.65 % SOLN nasal spray Place 1 spray into both nostrils at bedtime as needed  for congestion.   Yes [provider]  traMADol (ULTRAM) 50 MG tablet Take 1 tablet (50 mg total) by mouth every 6 (six) hours as needed (BACK PAIN). 09/06/21  Yes Elya Tarquinio, Eilleen Kempf, MD    Allergies  Allergen Reactions   Bee Venom Anaphylaxis, Shortness Of Breath, Swelling and Other (See Comments)    Swelling at site (reaction to bees and wasps)   Amlodipine Swelling and Other (See Comments)    Swelling of the ankles and hands    Levofloxacin Other (See Comments)    Joint pain    Alendronate Other (See  Comments)    Joint pains / hypercalcemia    Hctz [Hydrochlorothiazide] Palpitations and Other (See Comments)    Sweating     Patient Active Problem List   Diagnosis Date Noted   Class 1 obesity due to excess calories with serious comorbidity and body mass index (BMI) of 30.0 to 30.9 in adult 03/20/2023   Iron deficiency anemia 11/15/2022   Adenomatous polyp of ascending colon 11/15/2022   Adenomatous polyp of transverse colon 11/15/2022   Adenomatous polyp of descending colon 11/15/2022   Internal hemorrhoids 11/15/2022   History of myocardial infarction 09/20/2022   Gastroesophageal reflux disease without esophagitis 06/19/2022   Chronic bilateral low back pain without sciatica 09/06/2021   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) 02/21/2021   Recurrent major depressive disorder, in full remission (HCC) 02/21/2021   Atherosclerosis of aorta (HCC) 02/21/2021   Var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) 02/21/2021   Chronic diastolic CHF (congestive heart failure) (HCC)    Stage 3a chronic kidney disease (HCC) 11/10/2020   Chronic respiratory failure with hypoxia (HCC) 12/26/2018   Age-related osteoporosis with current pathological fracture 11/21/2018   Renal insufficiency 05/20/2018   History of compression fracture of spine 12/10/2016   Insomnia 05/16/2016   Pneumonitis, radiation (HCC) 04/11/2016   S/P lobectomy of lung 01/18/2016   Chemotherapy-induced neuropathy (HCC) 11/01/2015   Non-small cell carcinoma of right lung, stage 2 (HCC) 10/03/2015   Hyperlipidemia 08/14/2013   Spondylolisthesis of lumbar region 07/03/2013   Essential hypertension 12/17/2011   COPD GOLD B-C 12/17/2011   Renal cell cancer (HCC) 12/17/2011   Depression 12/17/2011    Past Medical History:  Diagnosis Date   Anemia    was while doing chemo   Anxiety    Arthritis    Asthma    Chemotherapy-induced neuropathy (HCC) 11/01/2015   CHF (congestive heart failure) (HCC)     Claustrophobia    COPD (chronic obstructive pulmonary disease) (HCC)    Depression    Encounter for antineoplastic chemotherapy 02/09/2016   GERD (gastroesophageal reflux disease)    Headache    prior to menopause   Heart murmur    History of echocardiogram    Echo 2/17: EF 60-65%, grade 1 diastolic dysfunction, mild MR, trivial pericardial effusion   History of nuclear stress test    Myoview 2/17: no ischemia or scar, EF 79%; low risk   History of radiation therapy 10/30/16-11/06/16   Hyperlipidemia    Hypertension    Insomnia 05/16/2016   Myocardial infarction (HCC)    Non-small cell carcinoma of right lung, stage 2 (HCC) 10/03/2015   Oxygen deficiency    Pneumonia    Radiation 02/29/16-04/10/16   50.4 Gy to right central chest   Renal cell carcinoma (HCC)    L nephrectomy  in 2012    Past Surgical History:  Procedure Laterality Date   BACK SURGERY  cervical 1991   BIOPSY  11/15/2022   Procedure: BIOPSY;  Surgeon: Shellia Cleverly, DO;  Location: [facility];  Service: Gastroenterology;;   COLONOSCOPY WITH PROPOFOL N/A 11/15/2022   Procedure: COLONOSCOPY WITH PROPOFOL;  Surgeon: Shellia Cleverly, DO;  Location: [facility];  Service: Gastroenterology;  Laterality: N/A;   ESOPHAGOGASTRODUODENOSCOPY (EGD) WITH PROPOFOL N/A 11/15/2022   Procedure: ESOPHAGOGASTRODUODENOSCOPY (EGD) WITH PROPOFOL;  Surgeon: Shellia Cleverly, DO;  Location: [facility];  Service: Gastroenterology;  Laterality: N/A;   EYE SURGERY     IR GENERIC HISTORICAL  01/16/2017   IR RADIOLOGIST EVAL & MGMT 01/16/2017 [facility]   IR GENERIC HISTORICAL  01/31/2017   IR FLUORO GUIDED NEEDLE PLC ASPIRATION/INJECTION LOC 01/31/2017 Julieanne Cotton, MD [facility]   IR GENERIC HISTORICAL  01/31/2017   IR VERTEBROPLASTY CERV/THOR BX INC UNI/BIL INC/INJECT/IMAGING 01/31/2017 Julieanne Cotton, MD [facility]   IR GENERIC HISTORICAL  01/31/2017   IR VERTEBROPLASTY EA ADDL (T&LS) BX INC UNI/BIL INC  INJECT/IMAGING 01/31/2017 Julieanne Cotton, MD [facility]   kidney cancer     LOBECTOMY Right    RUL   NEPHRECTOMY  2012   POLYPECTOMY  11/15/2022   Procedure: POLYPECTOMY;  Surgeon: Shellia Cleverly, DO;  Location: [facility];  Service: Gastroenterology;;   SPINE SURGERY     TUBAL LIGATION     VIDEO ASSISTED THORACOSCOPY (VATS)/WEDGE RESECTION Right 01/18/2016   Procedure: VIDEO ASSISTED THORACOSCOPY (VATS)/LUNG RESECTION, THOROCOTOMY, RIGHT UPPER LOBECTOMY, LYMPH NODE DISSECTION, PLACEMENT OF ON Q;  Surgeon: Delight Ovens, MD;  Location: [facility];  Service: Thoracic;  Laterality: Right;   VIDEO BRONCHOSCOPY Bilateral 09/20/2015   Procedure: VIDEO BRONCHOSCOPY WITHOUT FLUORO;  Surgeon: Oretha Milch, MD;  Location: [facility];  Service: Cardiopulmonary;  Laterality: Bilateral;   VIDEO BRONCHOSCOPY N/A 01/18/2016   Procedure: VIDEO BRONCHOSCOPY;  Surgeon: Delight Ovens, MD;  Location: [facility];  Service: Thoracic;  Laterality: N/A;    Social History   Socioeconomic History   Marital status: Widowed    Spouse name: Not on file   Number of children: 4   Years of education: Not on file   Highest education level: Master's degree (e.g., MA, MS, MEng, MEd, MSW, MBA)  Occupational History   Not on file  Tobacco Use   Smoking status: Former    Current packs/day: 0.00    Average packs/day: 1 pack/day for 50.0 years (50.0 ttl pk-yrs)    Types: Cigarettes    Start date: 03/15/1961    Quit date: 03/16/2011    Years since quitting: 12.4   Smokeless tobacco: Never   Tobacco comments:    last 4-5 years of smoking, smoked 0.5 pack/day   Vaping Use   Vaping status: Never Used  Substance and Sexual Activity   Alcohol use: No    Alcohol/week: 0.0 standard drinks of alcohol   Drug use: No   Sexual activity: Not Currently  Other Topics Concern   Not on file  Social History Narrative   Marital status: divorced; not dating in 2019.      Children: 4 children; 3 grandchildren adult; 4  gg.      Lives: alone in house      Employment: part-time work; substance abuse counselor; [employer].      Tobacco: quit smoking 2012; smoked 45 years      Alcohol: none      Drugs: none      Exercise: none in 2019; due to LEFT sciatica.  ADLs: independent with ADLs; drives.       Advanced Directives: YES: HCPOA: Nicholas Martinez/son.  FULL CODE but no prolonged measures.      Occupation: Substance Abuse Administrator, Civil Service   No exercise** Merged History Encounter **       ** Data from: 12/14/11 Enc Dept: [facility]       ** Data from: 12/17/11 Enc Dept: [facility]   Substance abuse counselor   Husband deceased   4 great grandchildren   Son works in same substance abuse counseling center as patient   Social Determinants of Corporate investment banker Strain: Low Risk  (08/23/2023)   Overall Financial Resource Strain (CARDIA)    Difficulty of Paying Living Expenses: Not very hard  Food Insecurity: No Food Insecurity (08/23/2023)   Hunger Vital Sign    Worried About Running Out of Food in the Last Year: Never true    Ran Out of Food in the Last Year: Never true  Transportation Needs: No Transportation Needs (08/23/2023)   PRAPARE - Administrator, Civil Service (Medical): No    Lack of Transportation (Non-Medical): No  Physical Activity: Insufficiently Active (08/23/2023)   Exercise Vital Sign    Days of Exercise per Week: 1 day    Minutes of Exercise per Session: 10 min  Stress: No Stress Concern Present (08/23/2023)   [employer] of Occupational Health - Occupational Stress Questionnaire    Feeling of Stress : Only a little  Social Connections: Moderately Isolated (08/23/2023)   Social Connection and Isolation Panel [NHANES]    Frequency of Communication with Friends and Family: More than three times a week    Frequency of Social Gatherings with Friends and Family: More than three times a week    Attends  Religious Services: 1 to 4 times per year    Active Member of [employer] or Organizations: No    Attends Banker Meetings: Patient declined    Marital Status: Divorced  Catering manager Violence: Not At Risk (05/30/2023)   Humiliation, Afraid, Rape, and Kick questionnaire    Fear of Current or Ex-Partner: No    Emotionally Abused: No    Physically Abused: No    Sexually Abused: No    Family History  Problem Relation Age of Onset   Cancer Mother        Bladder Cancer   Hypertension Mother    CAD Mother 24   Heart disease Sister    Breast cancer Sister    Birth defects Sister    Obesity Brother    Cancer Maternal Grandmother    Heart attack Daughter 56       s/p CABG   Glaucoma Daughter    Colon cancer Neg Hx    Colon polyps Neg Hx    Esophageal cancer Neg Hx    Rectal cancer Neg Hx    Stomach cancer Neg Hx      Review of Systems  Constitutional: Negative.  Negative for chills and fever.  HENT: Negative.  Negative for congestion and sore throat.   Respiratory: Negative.  Negative for cough and shortness of breath.   Cardiovascular: Negative.  Negative for chest pain and palpitations.  Gastrointestinal:  Positive for abdominal pain and constipation. Negative for blood in stool, diarrhea, nausea and vomiting.  Genitourinary: Negative.  Negative for dysuria and hematuria.  Musculoskeletal: Negative.   Skin: Negative.  Negative for rash.  Neurological: Negative.  Negative for dizziness and headaches.  All other systems reviewed and are negative.   Vitals:   08/26/23 0833  BP: 128/84  Pulse: 63  Temp: 98.5 F (36.9 C)  SpO2: 97%    Physical Exam Vitals reviewed.  Constitutional:      Appearance: Normal appearance.  HENT:     Head: Normocephalic.     Mouth/Throat:     Mouth: Mucous membranes are moist.     Pharynx: Oropharynx is clear.  Eyes:     Extraocular Movements: Extraocular movements intact.     Conjunctiva/sclera: Conjunctivae normal.      Pupils: Pupils are equal, round, and reactive to light.  Cardiovascular:     Rate and Rhythm: Normal rate and regular rhythm.     Pulses: Normal pulses.     Heart sounds: Normal heart sounds.  Pulmonary:     Effort: Pulmonary effort is normal.     Breath sounds: Normal breath sounds.  Abdominal:     General: There is no distension.     Palpations: Abdomen is soft.     Tenderness: There is abdominal tenderness (Mild epigastric tenderness). There is no guarding.  Musculoskeletal:     Cervical back: No tenderness.     Right lower leg: No edema.     Left lower leg: No edema.  Lymphadenopathy:     Cervical: No cervical adenopathy.  Skin:    General: Skin is warm and dry.     Capillary Refill: Capillary refill takes less than 2 seconds.  Neurological:     General: No focal deficit present.     Mental Status: She is alert and oriented to person, place, and time.  Psychiatric:        Mood and Affect: Mood normal.        Behavior: Behavior normal.      ASSESSMENT & PLAN: A total of 46 minutes was spent with the patient and counseling/coordination of care regarding preparing for this visit, review of most recent office visit notes, review of most recent emergency department visit notes, review of most recent blood work results, review of most recent EKG reports, review of multiple chronic medical conditions under management, review of all medications, education on nutrition, prognosis, documentation, and need for follow-up.  Problem List Items Addressed This Visit       Cardiovascular and Mediastinum   Essential hypertension    BP Readings from Last 3 Encounters:  08/26/23 128/84  08/19/23 132/74  03/20/23 126/84  Well-controlled hypertension Continue carvedilol 25 mg twice daily and losartan 100 mg daily       Atherosclerosis of aorta (HCC)    Continue atorvastatin 40 mg daily        Respiratory   COPD GOLD B-C   Non-small cell carcinoma of right lung, stage 2 (HCC)     Chronic stable condition.  Sees oncologist on a regular basis No complications      Chronic respiratory failure with hypoxia (HCC)    Oxygen dependent 24/7        Digestive   Var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)    Stable chronic condition.  No concerns.      Gastroesophageal reflux disease without esophagitis    Exacerbated by recent infection i.e. gastroenteritis Recommend to start pantoprazole 40 mg daily for 2 weeks      Relevant Medications   pantoprazole (PROTONIX) 40 MG tablet     Nervous and Auditory   Chemotherapy-induced neuropathy (HCC)    Stable.  Takes tramadol 50 mg every 6 hours as needed        Genitourinary   Renal cell cancer (HCC)   Stage 3a chronic kidney disease (HCC)    Advised to stay well-hydrated and avoid NSAIDs        Other   Epigastric discomfort - Primary    Related to recent bout of gastroenteritis Overall feeling better but still having occasional mild nausea Able to eat and drink.  Zofran ODT helps Recommend to start pantoprazole 40 mg daily for 2 weeks Clinically stable.  No red flag signs or symptoms. Advised to stay well-hydrated Diet and nutrition discussed      Relevant Medications   pantoprazole (PROTONIX) 40 MG tablet   Other Relevant Orders   Comprehensive metabolic panel   CBC with Differential/Platelet   Patient Instructions  Health Maintenance After Age 38 After age 50, you are at a higher risk for certain long-term diseases and infections as well as injuries from falls. Falls are a major cause of broken bones and head injuries in people who are older than age 11. Getting regular preventive care can help to keep you healthy and well. Preventive care includes getting regular testing and making lifestyle changes as recommended by your health care provider. Talk with your health care provider about: Which screenings and tests you should have. A screening is a test that checks for a disease when you have no symptoms. A diet and  exercise plan that is right for you. What should I know about screenings and tests to prevent falls? Screening and testing are the best ways to find a health problem early. Early diagnosis and treatment give you the best chance of managing medical conditions that are common after age 54. Certain conditions and lifestyle choices may make you more likely to have a fall. Your health care provider may recommend: Regular vision checks. Poor vision and conditions such as cataracts can make you more likely to have a fall. If you wear glasses, make sure to get your prescription updated if your vision changes. Medicine review. Work with your health care provider to regularly review all of the medicines you are taking, including over-the-counter medicines. Ask your health care provider about any side effects that may make you more likely to have a fall. Tell your health care provider if any medicines that you take make you feel dizzy or sleepy. Strength and balance checks. Your health care provider may recommend certain tests to check your strength and balance while standing, walking, or changing positions. Foot health exam. Foot pain and numbness, as well as not wearing proper footwear, can make you more likely to have a fall. Screenings, including: Osteoporosis screening. Osteoporosis is a condition that causes the bones to get weaker and break more easily. Blood pressure screening. Blood pressure changes and medicines to control blood pressure can make you feel dizzy. Depression screening. You may be more likely to have a fall if you have a fear of falling, feel depressed, or feel unable to do activities that you used to do. Alcohol use screening. Using too much alcohol can affect your balance and may make you more likely to have a fall. Follow these instructions at home: Lifestyle Do not drink alcohol if: Your health care provider tells you not to drink. If you drink alcohol: Limit how much you have  to: 0-1 drink a day for women. 0-2 drinks a day for men. Know how much alcohol is in your drink. In the [place]  one drink equals one 12 oz bottle of beer (355 mL), one 5 oz glass of wine (148 mL), or one 1 oz glass of hard liquor (44 mL). Do not use any products that contain nicotine or tobacco. These products include cigarettes, chewing tobacco, and vaping devices, such as e-cigarettes. If you need help quitting, ask your health care provider. Activity  Follow a regular exercise program to stay fit. This will help you maintain your balance. Ask your health care provider what types of exercise are appropriate for you. If you need a cane or walker, use it as recommended by your health care provider. Wear supportive shoes that have nonskid soles. Safety  Remove any tripping hazards, such as rugs, cords, and clutter. Install safety equipment such as grab bars in bathrooms and safety rails on stairs. Keep rooms and walkways well-lit. General instructions Talk with your health care provider about your risks for falling. Tell your health care provider if: You fall. Be sure to tell your health care provider about all falls, even ones that seem minor. You feel dizzy, tiredness (fatigue), or off-balance. Take over-the-counter and prescription medicines only as told by your health care provider. These include supplements. Eat a healthy diet and maintain a healthy weight. A healthy diet includes low-fat dairy products, low-fat (lean) meats, and fiber from whole grains, beans, and lots of fruits and vegetables. Stay current with your vaccines. Schedule regular health, dental, and eye exams. Summary Having a healthy lifestyle and getting preventive care can help to protect your health and wellness after age 54. Screening and testing are the best way to find a health problem early and help you avoid having a fall. Early diagnosis and treatment give you the best chance for managing medical conditions that  are more common for people who are older than age 64. Falls are a major cause of broken bones and head injuries in people who are older than age 35. Take precautions to prevent a fall at home. Work with your health care provider to learn what changes you can make to improve your health and wellness and to prevent falls. This information is not intended to replace advice given to you by your health care provider. Make sure you discuss any questions you have with your health care provider. Document Revised: 03/20/2021 Document Reviewed: 03/20/2021 Elsevier Patient Education  2024 Elsevier Inc.     Edwina Barth, MD [facility]

## 2023-09-20 ENCOUNTER — Telehealth: Payer: Self-pay

## 2023-09-20 NOTE — Telephone Encounter (Signed)
Transition Care Management Follow-up Telephone Call Date of discharge and from where: [facility] 10/7 How have you been since you were released from the hospital? Doing well and following up with Providers Any questions or concerns? No  Items Reviewed: Did the pt receive and understand the discharge instructions provided? Yes  Medications obtained and verified? Yes  Other? No  Any new allergies since your discharge? No  Dietary orders reviewed? No  Do you have support at home? Yes     Follow up appointments reviewed:  PCP Hospital f/u appt confirmed? Yes  Scheduled to see  on  @ . Specialist Hospital f/u appt confirmed? No  Scheduled to see  on  @ . Are transportation arrangements needed? No  If their condition worsens, is the pt aware to call PCP or go to the Emergency Dept.? Yes Was the patient provided with contact information for the PCP's office or ED? Yes Was to pt encouraged to call back with questions or concerns? Yes

## 2023-10-16 ENCOUNTER — Telehealth: Payer: Self-pay | Admitting: *Deleted

## 2023-10-16 ENCOUNTER — Other Ambulatory Visit: Payer: Self-pay | Admitting: *Deleted

## 2023-10-16 MED ORDER — BREZTRI AEROSPHERE 160-9-4.8 MCG/ACT IN AERO: 2.0000 | INHALATION_SPRAY | Freq: Two times a day (BID) | RESPIRATORY_TRACT | 4 refills | Status: DC

## 2023-10-16 NOTE — Telephone Encounter (Signed)
Received fax from [facility] requesting a refill for [facility] through patient assistance program.  Script printed for Dr. Delton Coombes to sign.

## 2023-11-05 ENCOUNTER — Other Ambulatory Visit: Payer: Self-pay | Admitting: Emergency Medicine

## 2024-01-14 ENCOUNTER — Telehealth: Payer: Self-pay

## 2024-01-14 ENCOUNTER — Ambulatory Visit: Payer: Medicare Other | Attending: Internal Medicine | Admitting: Internal Medicine

## 2024-01-14 VITALS — BP 109/68 | HR 63 | Ht 63.0 in | Wt 167.3 lb

## 2024-01-14 DIAGNOSIS — I214 Non-ST elevation (NSTEMI) myocardial infarction: Secondary | ICD-10-CM | POA: Insufficient documentation

## 2024-01-14 NOTE — Telephone Encounter (Signed)
  Patient Consent for Virtual Visit        Tammy Ball has provided verbal consent on 01/14/2024 for a virtual visit (video or telephone).   CONSENT FOR VIRTUAL VISIT FOR:  [name]  By participating in this virtual visit I agree to the following:  I hereby voluntarily request, consent and authorize [facility] and its employed or contracted physicians, physician assistants, nurse practitioners or other licensed health care professionals (the Practitioner), to provide me with telemedicine health care services (the "Services") as deemed necessary by the treating Practitioner. I acknowledge and consent to receive the Services by the Practitioner via telemedicine. I understand that the telemedicine visit will involve communicating with the Practitioner through live audiovisual communication technology and the disclosure of certain medical information by electronic transmission. I acknowledge that I have been given the opportunity to request an in-person assessment or other available alternative prior to the telemedicine visit and am voluntarily participating in the telemedicine visit.  I understand that I have the right to withhold or withdraw my consent to the use of telemedicine in the course of my care at any time, without affecting my right to future care or treatment, and that the Practitioner or I may terminate the telemedicine visit at any time. I understand that I have the right to inspect all information obtained and/or recorded in the course of the telemedicine visit and may receive copies of available information for a reasonable fee.  I understand that some of the potential risks of receiving the Services via telemedicine include:  Delay or interruption in medical evaluation due to technological equipment failure or disruption; Information transmitted may not be sufficient (e.g. poor resolution of images) to allow for appropriate medical decision making by the Practitioner;  and/or  In rare instances, security protocols could fail, causing a breach of personal health information.  Furthermore, I acknowledge that it is my responsibility to provide information about my medical history, conditions and care that is complete and accurate to the best of my ability. I acknowledge that Practitioner's advice, recommendations, and/or decision may be based on factors not within their control, such as incomplete or inaccurate data provided by me or distortions of diagnostic images or specimens that may result from electronic transmissions. I understand that the practice of medicine is not an exact science and that Practitioner makes no warranties or guarantees regarding treatment outcomes. I acknowledge that a copy of this consent can be made available to me via my patient portal [facility] MyChart), or I can request a printed copy by calling the office of [facility].    I understand that my insurance will be billed for this visit.   I have read or had this consent read to me. I understand the contents of this consent, which adequately explains the benefits and risks of the Services being provided via telemedicine.  I have been provided ample opportunity to ask questions regarding this consent and the Services and have had my questions answered to my satisfaction. I give my informed consent for the services to be provided through the use of telemedicine in my medical care

## 2024-01-14 NOTE — Patient Instructions (Signed)
 Medication Instructions:  Your physician recommends that you continue on your current medications as directed. Please refer to the Current Medication list given to you today.    *If you need a refill on your cardiac medications before your next appointment, please call your pharmacy*  Follow-Up: At [facility], you and your health needs are our priority.  As part of our continuing mission to provide you with exceptional heart care, we have created designated Provider Care Teams.  These Care Teams include your primary Cardiologist (physician) and Advanced Practice Providers (APPs -  Physician Assistants and Nurse Practitioners) who all work together to provide you with the care you need, when you need it.   Your next appointment:   12 month(s)  Provider:   Theodore Demark, PA-C, Edd Fabian, FNP, Micah Flesher, PA-C, Marjie Skiff, PA-C, Robet Leu, PA-C, Juanda Crumble, PA-C, Joni Reining, DNP, ANP, Azalee Course, PA-C, Bernadene Person, NP, or Reather Littler, NP

## 2024-01-14 NOTE — Progress Notes (Signed)
 Cardiology Office Note:    Date:  01/14/2024   ID:  Tammy Ball, DOB 29-May-1941, MRN [id number]  PCP:  Georgina Quint, MD   [facility] Providers Cardiologist:  Maisie Fus, MD     Referring MD: Georgina Quint, *   No chief complaint on file. Follow up  History of Present Illness:    Tammy Ball is a 83 y.o. female with a hx of RCC s/p left nephrectomy 2012, Stage IIIA NSCLC, SCC RUL, COPD on home O2. She presented to the hospital 06/09/2022 with N/V. EKG showed progressive inferior; anterolateral TWI. Troponin was elevated to the 600s. She was medically managed for NSTEMI. We discussed LHC in the hospital. She was apprehensive. Considering her low risk ACS presentation, opted to continue with medical management. She was started on DAPT. Echo showed normal LV function. No valve dx.   Today, she reports stomach pain. She saw her PCP Dr. [name]. She had abd [place] that did not show signs of cholecystitis. LFTs are normal   Stool is dark black which she reported in the hospital. Hgb is normal. She denies chest pressure. Has some fatigue. She's on chronic O2.  Blood pressures 1teens at home.   Interim Hx 01/11/2023 She was guaiac + EGD/colonoscopy at [facility] largely unrevealing from a occult bleeding. Still having dark tarry stools. Before she started the iron.  She attempted a capsule endoscopy, and it did not work. Recently saw Dr. Delton Coombes , no plans for bronch for increased sputum. She is being managed with azithromycin.   Denies CP. SOB is chronic on O2 with COPD. No LE edema. No PND/orthopnea. Weight is stable  Wt Readings from Last 3 Encounters:  08/26/23 170 lb (77.1 kg)  08/19/23 179 lb 14.3 oz (81.6 kg)  05/30/23 180 lb (81.6 kg)   Interim hx 01/14/2024 Virtual Visit. She has chronic SOB. Just minor ankle swelling. She denies chest pain or pressure. She is otherwise doing well. No hospitalizaitons  Current Medications: Current Outpatient Medications  on File Prior to Visit  Medication Sig Dispense Refill   acetaminophen (TYLENOL) 500 MG tablet Take 500 mg by mouth every 6 (six) hours as needed for headache or moderate pain (pain).     albuterol (PROVENTIL) (2.5 MG/3ML) 0.083% nebulizer solution Take 3 mLs (2.5 mg total) by nebulization every 6 (six) hours as needed for wheezing or shortness of breath. 75 mL 6   albuterol (VENTOLIN HFA) 108 (90 Base) MCG/ACT inhaler Inhale 1-2 puffs into the lungs every 6 (six) hours as needed for wheezing or shortness of breath. 1 each 6   Ascorbic Acid (VITAMIN C) 1000 MG tablet Take 1,000 mg by mouth every morning.     aspirin EC 81 MG tablet Take 81 mg by mouth every morning.     atorvastatin (LIPITOR) 40 MG tablet TAKE 1 TABLET BY MOUTH EVERY DAY 90 tablet 3   Budeson-Glycopyrrol-Formoterol (BREZTRI AEROSPHERE) 160-9-4.8 MCG/ACT AERO Inhale 2 puffs into the lungs in the morning and at bedtime. 3 each 4   Calcium Carbonate Antacid (TUMS PO) Take 1 tablet by mouth 4 (four) times daily as needed (heartburn).     Carboxymethylcellulose Sodium (ARTIFICIAL TEARS OP) Place 1 drop into both eyes daily as needed (dry eyes).     carvedilol (COREG) 25 MG tablet TAKE 1 TABLET BY MOUTH TWICE A DAY 180 tablet 2   cholecalciferol (VITAMIN D3) 25 MCG (1000 UNIT) tablet Take 1,000 Units by mouth daily.  diphenhydrAMINE (BENADRYL) 25 mg capsule Take 25 mg by mouth at bedtime as needed for allergies.     Guaifenesin (MUCINEX MAXIMUM STRENGTH) 1200 MG TB12 Take 1,200 mg by mouth every morning.     losartan (COZAAR) 100 MG tablet TAKE 1 TABLET BY MOUTH EVERY DAY 90 tablet 1   Omega-3 Fatty Acids (FISH OIL) 1200 MG CAPS Take 1,200 mg by mouth every morning.     OXYGEN Inhale 2 L into the lungs as needed (to keep SATS at or above 90).     sodium chloride (OCEAN) 0.65 % SOLN nasal spray Place 1 spray into both nostrils at bedtime as needed for congestion.     traMADol (ULTRAM) 50 MG tablet Take 1 tablet (50 mg total) by mouth  every 6 (six) hours as needed (BACK PAIN). 50 tablet 1   Budeson-Glycopyrrol-Formoterol (BREZTRI AEROSPHERE) 160-9-4.8 MCG/ACT AERO Inhale 2 puffs into the lungs in the morning and at bedtime. 10.7 g 11   loperamide (IMODIUM) 2 MG capsule Take 1 capsule (2 mg total) by mouth 4 (four) times daily as needed for diarrhea or loose stools. 12 capsule 0   ondansetron (ZOFRAN-ODT) 4 MG disintegrating tablet Take 1 tablet (4 mg total) by mouth every 8 (eight) hours as needed for nausea or vomiting. 12 tablet 1   pantoprazole (PROTONIX) 40 MG tablet Take 1 tablet (40 mg total) by mouth daily. 14 tablet 1   No current facility-administered medications on file prior to visit.     Allergies:   Bee venom, Amlodipine, Levofloxacin, Alendronate, and Hctz [hydrochlorothiazide]     ROS:   Please see the history of present illness.     All other systems reviewed and are negative.  EKGs/Labs/Other Studies Reviewed:    The following studies were reviewed today:   EKG:  EKG is  ordered today.  The ekg ordered today demonstrates   06/28/2022-NSR, inferior Q, poor r wave progression c/f anterior infarct  Recent Labs: 08/26/2023: ALT 15; BUN 13; Creatinine, Ser 1.16; Hemoglobin 12.0; Platelets 251.0; Potassium 4.3; Sodium 136  Recent Lipid Panel    Component Value Date/Time   CHOL 131 06/10/2022 0243   CHOL 163 11/21/2018 1058   TRIG 62 06/10/2022 0243   HDL 52 06/10/2022 0243   HDL 87 11/21/2018 1058   CHOLHDL 2.5 06/10/2022 0243   VLDL 12 06/10/2022 0243   LDLCALC 67 06/10/2022 0243   LDLCALC 61 11/21/2018 1058     Risk Assessment/Calculations:           Physical Exam:    VS:   Vitals:   01/14/24 0944  BP: 109/68  Pulse: 63  SpO2: 96%     Wt Readings from Last 3 Encounters:  08/26/23 170 lb (77.1 kg)  08/19/23 179 lb 14.3 oz (81.6 kg)  05/30/23 180 lb (81.6 kg)     GEN:  Well nourished, well developed in no acute distress HEENT: Normal NECK: No JVD;  LYMPHATICS: No  lymphadenopathy CARDIAC: RRR, no murmurs, rubs, gallops RESPIRATORY:  Clear to auscultation without rales, wheezing or rhonchi  ABDOMEN: Soft, non-tender, non-distended MUSCULOSKELETAL:  No edema; No deformity  SKIN: Warm and dry NEUROLOGIC:  Alert and oriented x 3 PSYCHIATRIC:  Normal affect   ASSESSMENT:    NSTEMI: 06/09/2022. Medically managed. No BRBPR, working up melena. Hgb has been stable.   -S/p  DAPT ; cont asa 81 mg daily  -continue lipitor 40 mg daily (LDL 67 mg/dL at goal)  HTN: well controlled at home.  Continue coreg 25 mg BID, losartan 100 mg daily/ she's taking 50 mg BID.  Cardio Onc: Hx of NSCLC with large right upper lobe mass diagnosed 09/2015. S/p carboplatin and paclitaxel therapy last doses in 2017. S/p 50 Gy R central chest. Has not received high cardiotoxic risk therapy. Lower risk of XRT cardiotoxicity with central chest location PLAN:    In order of problems listed above:  Follow up in 1 year with an APP   Virtual Visit via Video Note  I connected with [name] on 01/14/24 at 10:00 AM EST by a video enabled telemedicine application and verified that I am speaking with the correct person using two identifiers.  I discussed the limitations of evaluation and management by telemedicine and the availability of in person appointments. The patient expressed understanding and agreed to proceed.  I discussed the assessment and treatment plan with the patient. The patient was provided an opportunity to ask questions and all were answered. The patient agreed with the plan and demonstrated an understanding of the instructions.   The patient was advised to call back or seek an in-person evaluation if the symptoms worsen or if the condition fails to improve as anticipated.  I provided 15 minutes of non-face-to-face time during this encounter.   Medication Adjustments/Labs and Tests Ordered: Current medicines are reviewed at length with the patient today.   Concerns regarding medicines are outlined above.  No orders of the defined types were placed in this encounter.  No orders of the defined types were placed in this encounter.   There are no Patient Instructions on file for this visit.   Signed, Maisie Fus, MD  01/14/2024 8:26 AM    [facility]

## 2024-02-02 ENCOUNTER — Encounter: Payer: Self-pay | Admitting: Emergency Medicine

## 2024-02-02 ENCOUNTER — Other Ambulatory Visit: Payer: Self-pay | Admitting: Emergency Medicine

## 2024-02-17 ENCOUNTER — Other Ambulatory Visit: Payer: Self-pay | Admitting: Emergency Medicine

## 2024-03-02 ENCOUNTER — Encounter: Payer: Self-pay | Admitting: Internal Medicine

## 2024-03-16 ENCOUNTER — Ambulatory Visit (HOSPITAL_COMMUNITY)
Admission: RE | Admit: 2024-03-16 | Discharge: 2024-03-16 | Disposition: A | Discharge status: Home / Self Care | Source: Ambulatory Visit | Attending: Internal Medicine | Admitting: Internal Medicine

## 2024-03-16 ENCOUNTER — Inpatient Hospital Stay: Attending: Internal Medicine

## 2024-03-16 DIAGNOSIS — C3411 Malignant neoplasm of upper lobe, right bronchus or lung: Secondary | ICD-10-CM | POA: Insufficient documentation

## 2024-03-16 DIAGNOSIS — I252 Old myocardial infarction: Secondary | ICD-10-CM | POA: Insufficient documentation

## 2024-03-16 DIAGNOSIS — M4854XA Collapsed vertebra, not elsewhere classified, thoracic region, initial encounter for fracture: Secondary | ICD-10-CM | POA: Insufficient documentation

## 2024-03-16 DIAGNOSIS — I110 Hypertensive heart disease with heart failure: Secondary | ICD-10-CM | POA: Insufficient documentation

## 2024-03-16 DIAGNOSIS — I509 Heart failure, unspecified: Secondary | ICD-10-CM | POA: Insufficient documentation

## 2024-03-16 DIAGNOSIS — Z79899 Other long term (current) drug therapy: Secondary | ICD-10-CM | POA: Diagnosis not present

## 2024-03-16 DIAGNOSIS — Z7982 Long term (current) use of aspirin: Secondary | ICD-10-CM | POA: Diagnosis not present

## 2024-03-16 DIAGNOSIS — I700 Atherosclerosis of aorta: Secondary | ICD-10-CM | POA: Diagnosis not present

## 2024-03-16 DIAGNOSIS — E785 Hyperlipidemia, unspecified: Secondary | ICD-10-CM | POA: Insufficient documentation

## 2024-03-16 DIAGNOSIS — I2510 Atherosclerotic heart disease of native coronary artery without angina pectoris: Secondary | ICD-10-CM | POA: Diagnosis not present

## 2024-03-16 DIAGNOSIS — G4700 Insomnia, unspecified: Secondary | ICD-10-CM | POA: Diagnosis not present

## 2024-03-16 DIAGNOSIS — Z9981 Dependence on supplemental oxygen: Secondary | ICD-10-CM | POA: Diagnosis not present

## 2024-03-16 DIAGNOSIS — C3490 Malignant neoplasm of unspecified part of unspecified bronchus or lung: Secondary | ICD-10-CM | POA: Insufficient documentation

## 2024-03-16 DIAGNOSIS — Z9221 Personal history of antineoplastic chemotherapy: Secondary | ICD-10-CM | POA: Diagnosis not present

## 2024-03-16 DIAGNOSIS — Z85528 Personal history of other malignant neoplasm of kidney: Secondary | ICD-10-CM | POA: Insufficient documentation

## 2024-03-16 DIAGNOSIS — J4489 Other specified chronic obstructive pulmonary disease: Secondary | ICD-10-CM | POA: Diagnosis not present

## 2024-03-16 DIAGNOSIS — Z905 Acquired absence of kidney: Secondary | ICD-10-CM | POA: Insufficient documentation

## 2024-03-16 DIAGNOSIS — J432 Centrilobular emphysema: Secondary | ICD-10-CM | POA: Diagnosis not present

## 2024-03-16 DIAGNOSIS — Z923 Personal history of irradiation: Secondary | ICD-10-CM | POA: Insufficient documentation

## 2024-03-16 DIAGNOSIS — M954 Acquired deformity of chest and rib: Secondary | ICD-10-CM | POA: Insufficient documentation

## 2024-03-16 DIAGNOSIS — K219 Gastro-esophageal reflux disease without esophagitis: Secondary | ICD-10-CM | POA: Insufficient documentation

## 2024-03-16 DIAGNOSIS — C7802 Secondary malignant neoplasm of left lung: Secondary | ICD-10-CM | POA: Diagnosis not present

## 2024-03-16 DIAGNOSIS — R918 Other nonspecific abnormal finding of lung field: Secondary | ICD-10-CM | POA: Diagnosis not present

## 2024-03-16 DIAGNOSIS — Z85118 Personal history of other malignant neoplasm of bronchus and lung: Secondary | ICD-10-CM | POA: Insufficient documentation

## 2024-03-16 LAB — CBC WITH DIFFERENTIAL (CANCER CENTER ONLY)
Abs Immature Granulocytes: 0.02 10*3/uL (ref 0.00–0.07)
Basophils Absolute: 0.1 10*3/uL (ref 0.0–0.1)
Basophils Relative: 1 %
Eosinophils Absolute: 0.2 10*3/uL (ref 0.0–0.5)
Eosinophils Relative: 3 %
HCT: 38.9 % (ref 36.0–46.0)
Hemoglobin: 12.7 g/dL (ref 12.0–15.0)
Immature Granulocytes: 0 %
Lymphocytes Relative: 17 %
Lymphs Abs: 1.6 10*3/uL (ref 0.7–4.0)
MCH: 31.1 pg (ref 26.0–34.0)
MCHC: 32.6 g/dL (ref 30.0–36.0)
MCV: 95.3 fL (ref 80.0–100.0)
Monocytes Absolute: 0.8 10*3/uL (ref 0.1–1.0)
Monocytes Relative: 8 %
Neutro Abs: 6.7 10*3/uL (ref 1.7–7.7)
Neutrophils Relative %: 71 %
Platelet Count: 223 10*3/uL (ref 150–400)
RBC: 4.08 MIL/uL (ref 3.87–5.11)
RDW: 15.1 % (ref 11.5–15.5)
WBC Count: 9.3 10*3/uL (ref 4.0–10.5)
nRBC: 0 % (ref 0.0–0.2)

## 2024-03-16 LAB — CMP (CANCER CENTER ONLY)
ALT: 15 U/L (ref 0–44)
AST: 19 U/L (ref 15–41)
Albumin: 4.4 g/dL (ref 3.5–5.0)
Alkaline Phosphatase: 71 U/L (ref 38–126)
Anion gap: 4 — ABNORMAL LOW (ref 5–15)
BUN: 18 mg/dL (ref 8–23)
CO2: 36 mmol/L — ABNORMAL HIGH (ref 22–32)
Calcium: 9.8 mg/dL (ref 8.9–10.3)
Chloride: 97 mmol/L — ABNORMAL LOW (ref 98–111)
Creatinine: 0.95 mg/dL (ref 0.44–1.00)
GFR, Estimated: >60 mL/min (ref >60)
Glucose, Bld: 109 mg/dL — ABNORMAL HIGH (ref 70–99)
Potassium: 4.6 mmol/L (ref 3.5–5.1)
Sodium: 137 mmol/L (ref 135–145)
Total Bilirubin: 0.6 mg/dL (ref 0.0–1.2)
Total Protein: 6.9 g/dL (ref 6.5–8.1)

## 2024-03-19 ENCOUNTER — Other Ambulatory Visit: Payer: Medicare Other

## 2024-03-23 ENCOUNTER — Inpatient Hospital Stay (HOSPITAL_BASED_OUTPATIENT_CLINIC_OR_DEPARTMENT_OTHER): Payer: Medicare Other | Admitting: Internal Medicine

## 2024-03-23 VITALS — BP 162/89 | HR 71 | Temp 97.2°F | Resp 17 | Ht 63.0 in | Wt 168.0 lb

## 2024-03-23 DIAGNOSIS — C7802 Secondary malignant neoplasm of left lung: Secondary | ICD-10-CM | POA: Diagnosis not present

## 2024-03-23 DIAGNOSIS — C3411 Malignant neoplasm of upper lobe, right bronchus or lung: Secondary | ICD-10-CM | POA: Diagnosis not present

## 2024-03-23 DIAGNOSIS — C3490 Malignant neoplasm of unspecified part of unspecified bronchus or lung: Secondary | ICD-10-CM

## 2024-03-23 DIAGNOSIS — Z85118 Personal history of other malignant neoplasm of bronchus and lung: Secondary | ICD-10-CM | POA: Diagnosis not present

## 2024-03-23 DIAGNOSIS — I509 Heart failure, unspecified: Secondary | ICD-10-CM | POA: Diagnosis not present

## 2024-03-23 DIAGNOSIS — J4489 Other specified chronic obstructive pulmonary disease: Secondary | ICD-10-CM | POA: Diagnosis not present

## 2024-03-23 DIAGNOSIS — I110 Hypertensive heart disease with heart failure: Secondary | ICD-10-CM | POA: Diagnosis not present

## 2024-03-23 NOTE — Progress Notes (Signed)
 [facility] Telephone:([phone]   [phone]  OFFICE PROGRESS NOTE  Elvira Hammersmith, MD [address] [place] [address]  DIAGNOSIS: Stage IIIA (T2a, N2, M0) non-small cell lung cancer, squamous cell carcinoma presented with large right upper lobe lung mass with right hilar involvement diagnosed in November 2016.  PRIOR THERAPY:  1) Neoadjuvant systemic chemotherapy with carboplatin  for AUC of 6 and paclitaxel  200 MG/M2 every 3 weeks, status post 3 cycles with partial response. 2) Video bronchoscopy, right video-assisted thoracoscopy, minithoracotomy, right upper lobectomy with lymph node dissection on 01/18/2016. The final pathology revealed residual squamous cell carcinoma measuring 3.1 cm with close resection margin and metastatic carcinoma to lymph nodes at levels 10R, 12R and 4R. (ypT2a, yN2).  3) Concurrent chemoradiation with weekly carboplatin  for AUC of 2 and paclitaxel  45 MG/M2. First dose 02/27/2016. Status post 5 cycles. Last dose was given 03/26/2016. 4) SBRT to left lower lobe lung nodule under the care of Dr. Eloise Hake completed on 11/06/2016.  CURRENT THERAPY: Observation.  INTERVAL HISTORY: Tammy Ball 83 y.o. female returns to the clinic today for annual follow-up visit. Discussed the use of AI scribe software for clinical note transcription with the patient, who gave verbal consent to proceed.  History of Present Illness   Tammy Ball "[name]" is an 83 year old female with stage 3A non-small cell lung cancer who presents for evaluation and repeat CT scan of the chest.  She has a history of stage 3A non-small cell lung cancer, squamous cell carcinoma, diagnosed in November 2016. She underwent post-neoadjuvant chemotherapy followed by a right upper lobectomy. Following disease recurrence, she was treated with a course of concurrent chemoradiation. She is currently under observation.  She has chronic obstructive pulmonary disease  (COPD) and is currently on oxygen  therapy. She wants a smaller oxygen  tank for convenience.  No new symptoms such as chest pain or hemoptysis. She mentions that her only issue is 'aging'.        MEDICAL HISTORY: Past Medical History:  Diagnosis Date   Anemia    was while doing chemo   Anxiety    Arthritis    Asthma    Chemotherapy-induced neuropathy (HCC) 11/01/2015   CHF (congestive heart failure) (HCC)    Claustrophobia    COPD (chronic obstructive pulmonary disease) (HCC)    Depression    Encounter for antineoplastic chemotherapy 02/09/2016   GERD (gastroesophageal reflux disease)    Headache    prior to menopause   Heart murmur    History of echocardiogram    Echo 2/17: EF 60-65%, grade 1 diastolic dysfunction, mild MR, trivial pericardial effusion   History of nuclear stress test    Myoview 2/17: no ischemia or scar, EF 79%; low risk   History of radiation therapy 10/30/16-11/06/16   Hyperlipidemia    Hypertension    Insomnia 05/16/2016   Myocardial infarction (HCC)    Non-small cell carcinoma of right lung, stage 2 (HCC) 10/03/2015   Oxygen  deficiency    Pneumonia    Radiation 02/29/16-04/10/16   50.4 Gy to right central chest   Renal cell carcinoma (HCC)    L nephrectomy  in 2012    ALLERGIES:  is allergic to bee venom, amlodipine , levofloxacin, alendronate , and hctz [hydrochlorothiazide ].  MEDICATIONS:  Current Outpatient Medications  Medication Sig Dispense Refill   acetaminophen  (TYLENOL ) 500 MG tablet Take 500 mg by mouth every 6 (six) hours as needed for headache or moderate pain (pain).  albuterol  (PROVENTIL ) (2.5 MG/3ML) 0.083% nebulizer solution Take 3 mLs (2.5 mg total) by nebulization every 6 (six) hours as needed for wheezing or shortness of breath. 75 mL 6   albuterol  (VENTOLIN  HFA) 108 (90 Base) MCG/ACT inhaler Inhale 1-2 puffs into the lungs every 6 (six) hours as needed for wheezing or shortness of breath. 1 each 6   Ascorbic Acid  (VITAMIN C)  1000 MG tablet Take 1,000 mg by mouth every morning.     aspirin  EC 81 MG tablet Take 81 mg by mouth every morning.     atorvastatin  (LIPITOR) 40 MG tablet TAKE 1 TABLET BY MOUTH EVERY DAY 90 tablet 3   Budeson-Glycopyrrol-Formoterol  (BREZTRI  AEROSPHERE) 160-9-4.8 MCG/ACT AERO Inhale 2 puffs into the lungs in the morning and at bedtime. 10.7 g 11   Budeson-Glycopyrrol-Formoterol  (BREZTRI  AEROSPHERE) 160-9-4.8 MCG/ACT AERO Inhale 2 puffs into the lungs in the morning and at bedtime. 3 each 4   Calcium  Carbonate Antacid (TUMS PO) Take 1 tablet by mouth 4 (four) times daily as needed (heartburn).     Carboxymethylcellulose Sodium (ARTIFICIAL TEARS OP) Place 1 drop into both eyes daily as needed (dry eyes).     carvedilol  (COREG ) 25 MG tablet TAKE 1 TABLET BY MOUTH TWICE A DAY 180 tablet 2   cholecalciferol  (VITAMIN D3) 25 MCG (1000 UNIT) tablet Take 1,000 Units by mouth daily.     diphenhydrAMINE  (BENADRYL ) 25 mg capsule Take 25 mg by mouth at bedtime as needed for allergies.     Guaifenesin  (MUCINEX  MAXIMUM STRENGTH) 1200 MG TB12 Take 1,200 mg by mouth every morning.     loperamide  (IMODIUM ) 2 MG capsule Take 1 capsule (2 mg total) by mouth 4 (four) times daily as needed for diarrhea or loose stools. 12 capsule 0   losartan  (COZAAR ) 100 MG tablet TAKE 1 TABLET BY MOUTH EVERY DAY 90 tablet 1   Omega-3 Fatty Acids  (FISH OIL) 1200 MG CAPS Take 1,200 mg by mouth every morning.     ondansetron  (ZOFRAN -ODT) 4 MG disintegrating tablet Take 1 tablet (4 mg total) by mouth every 8 (eight) hours as needed for nausea or vomiting. 12 tablet 1   OXYGEN  Inhale 2 L into the lungs as needed (to keep SATS at or above 90).     pantoprazole  (PROTONIX ) 40 MG tablet Take 1 tablet (40 mg total) by mouth daily. 14 tablet 1   sodium chloride  (OCEAN) 0.65 % SOLN nasal spray Place 1 spray into both nostrils at bedtime as needed for congestion.     traMADol  (ULTRAM ) 50 MG tablet Take 1 tablet (50 mg total) by mouth every 6  (six) hours as needed (BACK PAIN). 50 tablet 1   No current facility-administered medications for this visit.    SURGICAL HISTORY:  Past Surgical History:  Procedure Laterality Date   BACK SURGERY     cervical 1991   BIOPSY  11/15/2022   Procedure: BIOPSY;  Surgeon: Annis Kinder, DO;  Location: [facility];  Service: Gastroenterology;;   COLONOSCOPY WITH PROPOFOL  N/A 11/15/2022   Procedure: COLONOSCOPY WITH PROPOFOL ;  Surgeon: Annis Kinder, DO;  Location: [facility];  Service: Gastroenterology;  Laterality: N/A;   ESOPHAGOGASTRODUODENOSCOPY (EGD) WITH PROPOFOL  N/A 11/15/2022   Procedure: ESOPHAGOGASTRODUODENOSCOPY (EGD) WITH PROPOFOL ;  Surgeon: Annis Kinder, DO;  Location: [facility];  Service: Gastroenterology;  Laterality: N/A;   EYE SURGERY     IR GENERIC HISTORICAL  01/16/2017   IR RADIOLOGIST EVAL & MGMT 01/16/2017 [facility]   IR GENERIC HISTORICAL  01/31/2017   IR FLUORO GUIDED NEEDLE PLC ASPIRATION/INJECTION LOC 01/31/2017 Luellen Sages, MD [facility]   IR GENERIC HISTORICAL  01/31/2017   IR VERTEBROPLASTY CERV/THOR BX INC UNI/BIL INC/INJECT/IMAGING 01/31/2017 Luellen Sages, MD [facility]   IR GENERIC HISTORICAL  01/31/2017   IR VERTEBROPLASTY EA ADDL (T&LS) BX INC UNI/BIL INC INJECT/IMAGING 01/31/2017 Luellen Sages, MD [facility]   kidney cancer     LOBECTOMY Right    RUL   NEPHRECTOMY  2012   POLYPECTOMY  11/15/2022   Procedure: POLYPECTOMY;  Surgeon: Annis Kinder, DO;  Location: [facility];  Service: Gastroenterology;;   SPINE SURGERY     TUBAL LIGATION     VIDEO ASSISTED THORACOSCOPY (VATS)/WEDGE RESECTION Right 01/18/2016   Procedure: VIDEO ASSISTED THORACOSCOPY (VATS)/LUNG RESECTION, THOROCOTOMY, RIGHT UPPER LOBECTOMY, LYMPH NODE DISSECTION, PLACEMENT OF ON Q;  Surgeon: Norita Beauvais, MD;  Location: [facility];  Service: Thoracic;  Laterality: Right;   VIDEO BRONCHOSCOPY Bilateral 09/20/2015   Procedure: VIDEO BRONCHOSCOPY  WITHOUT FLUORO;  Surgeon: Lind Repine, MD;  Location: [facility];  Service: Cardiopulmonary;  Laterality: Bilateral;   VIDEO BRONCHOSCOPY N/A 01/18/2016   Procedure: VIDEO BRONCHOSCOPY;  Surgeon: Norita Beauvais, MD;  Location: [facility];  Service: Thoracic;  Laterality: N/A;    REVIEW OF SYSTEMS:  A comprehensive review of systems was negative except for: Respiratory: positive for dyspnea on exertion   PHYSICAL EXAMINATION: General appearance: alert, cooperative and no distress Head: Normocephalic, without obvious abnormality, atraumatic Neck: no adenopathy, no JVD, supple, symmetrical, trachea midline and thyroid  not enlarged, symmetric, no tenderness/mass/nodules Lymph nodes: Cervical, supraclavicular, and axillary nodes normal. Resp: clear to auscultation bilaterally Back: symmetric, no curvature. ROM normal. No CVA tenderness. Cardio: regular rate and rhythm, S1, S2 normal, no murmur, click, rub or gallop GI: soft, non-tender; bowel sounds normal; no masses,  no organomegaly Extremities: extremities normal, atraumatic, no cyanosis or edema  ECOG PERFORMANCE STATUS: 1 - Symptomatic but completely ambulatory  Blood pressure (!) 162/89, pulse 71, temperature (!) 97.2 F (36.2 C), resp. rate 17, height 5\' 3"  (1.6 m), weight 168 lb (76.2 kg), SpO2 94%.  LABORATORY DATA: Lab Results  Component Value Date   WBC 9.3 03/16/2024   HGB 12.7 03/16/2024   HCT 38.9 03/16/2024   MCV 95.3 03/16/2024   PLT 223 03/16/2024      Chemistry      Component Value Date/Time   NA 137 03/16/2024 0916   NA 140 11/21/2018 1058   NA 140 11/11/2017 0939   K 4.6 03/16/2024 0916   K 4.6 11/11/2017 0939   CL 97 (L) 03/16/2024 0916   CO2 36 (H) 03/16/2024 0916   CO2 28 11/11/2017 0939   BUN 18 03/16/2024 0916   BUN 23 11/21/2018 1058   BUN 30.3 (H) 11/11/2017 0939   CREATININE 0.95 03/16/2024 0916   CREATININE 1.3 (H) 11/11/2017 0939   GLU 105 12/10/2009 0000      Component Value Date/Time    CALCIUM  9.8 03/16/2024 0916   CALCIUM  10.0 11/11/2017 0939   ALKPHOS 71 03/16/2024 0916   ALKPHOS 102 11/11/2017 0939   AST 19 03/16/2024 0916   AST 18 11/11/2017 0939   ALT 15 03/16/2024 0916   ALT 19 11/11/2017 0939   BILITOT 0.6 03/16/2024 0916   BILITOT 0.42 11/11/2017 0939       RADIOGRAPHIC STUDIES: CT Chest Wo Contrast Result Date: 03/22/2024 CLINICAL DATA:  Non-small cell lung cancer. History of right upper lobectomy in 2017. Previous  left lower lobe SBRT. * Tracking Code: BO * EXAM: CT CHEST WITHOUT CONTRAST TECHNIQUE: Multidetector CT imaging of the chest was performed following the standard protocol without IV contrast. RADIATION DOSE REDUCTION: This exam was performed according to the departmental dose-optimization program which includes automated exposure control, adjustment of the mA and/or kV according to patient size and/or use of iterative reconstruction technique. COMPARISON:  Chest CT 03/14/2023 and 12/14/2022. FINDINGS: Cardiovascular: No acute vascular findings on noncontrast imaging. There is atherosclerosis of the aorta, great vessels and coronary arteries. Stable chronic mild dilatation of the central pulmonary arteries. The heart size is normal. There is no pericardial effusion. Mediastinum/Nodes: There are no enlarged mediastinal, hilar or axillary lymph nodes.Hilar assessment is limited by the lack of intravenous contrast, although the hilar contours appear unchanged. The thyroid  gland, trachea and esophagus demonstrate no significant findings. Lungs/Pleura: Stable postsurgical changes from right upper lobectomy. Stable chronic bandlike opacity inferiorly in the left lower lobe attributed to prior radiation therapy. Moderate centrilobular emphysema with stable scarring in the right perihilar region. No evidence of local recurrence, suspicious pulmonary nodularity, pleural effusion or pneumothorax. Upper abdomen: No acute findings are seen within the visualized upper  abdomen. There are postsurgical changes from previous left nephrectomy. Aortic atherosclerosis noted. Musculoskeletal/Chest wall: There is no chest wall mass or suspicious osseous finding. Stable chronic spinal augmentation changes at T3 and T4 with stable underlying compression deformities at those levels and at T6. Previous cervical fusion. Chronic right lateral rib deformities. IMPRESSION: 1. Stable postsurgical changes from right upper lobectomy. No evidence of local recurrence or metastatic disease. 2. Stable chronic bandlike opacity inferiorly in the left lower lobe attributed to prior radiation therapy. 3. Aortic Atherosclerosis (ICD10-I70.0) and Emphysema (ICD10-J43.9). Electronically Signed   By: Elmon Hagedorn M.D.   On: 03/22/2024 12:23     ASSESSMENT AND PLAN:  This is a very pleasant 83 years old white female with stage IIIA non-small cell lung cancer, squamous cell carcinoma status post neoadjuvant systemic chemotherapy was carboplatin  and paclitaxel  with partial response followed by right upper lobectomy and lymph node dissection. She was found to have evidence of metastatic disease to the mediastinal lymph nodes and close resection margin. The patient underwent a course of concurrent chemoradiation with weekly carboplatin  and paclitaxel . She was also treated with SBRT left lower lobe pulmonary nodule under the care of Dr. Eloise Hake. The patient has been in observation since 2017 and she is doing fine. The patient had repeat CT scan of the chest performed recently.  I personally and independently reviewed the scan and discussed the result with the patient today.  Her scan showed no concerning findings for disease progression. Assessment and Plan    Stage 3A non-small cell lung cancer Stage 3A non-small cell lung cancer, squamous cell carcinoma, diagnosed in November 2016. Treated with post-neoadjuvant chemotherapy and right upper lobectomy, followed by chemoradiation for recurrence. Currently  disease-free for nine years with no evidence of progression on recent CT scan and normal blood work. No new symptoms reported. - Continue observation - Schedule follow-up in one year  Chronic obstructive pulmonary disease (COPD) Chronic obstructive pulmonary disease managed with supplemental oxygen . No new symptoms such as chest pain or hemoptysis reported. She desires a smaller oxygen  tank for convenience.   The patient was advised to call immediately if she has any other concerning symptoms in the interval. The patient voices understanding of current disease status and treatment options and is in agreement with the current care plan. All questions  were answered. The patient knows to call the clinic with any problems, questions or concerns. We can certainly see the patient much sooner if necessary.  Disclaimer: This note was dictated with voice recognition software. Similar sounding words can inadvertently be transcribed and may not be corrected upon review.

## 2024-04-15 ENCOUNTER — Other Ambulatory Visit: Payer: Self-pay

## 2024-04-15 DIAGNOSIS — I214 Non-ST elevation (NSTEMI) myocardial infarction: Secondary | ICD-10-CM

## 2024-04-15 MED ORDER — CARVEDILOL 25 MG PO TABS: 25.0000 mg | ORAL_TABLET | Freq: Two times a day (BID) | ORAL | 3 refills | Status: DC

## 2024-04-16 DIAGNOSIS — Z961 Presence of intraocular lens: Secondary | ICD-10-CM | POA: Diagnosis not present

## 2024-04-16 DIAGNOSIS — H40023 Open angle with borderline findings, high risk, bilateral: Secondary | ICD-10-CM | POA: Diagnosis not present

## 2024-04-16 DIAGNOSIS — H16223 Keratoconjunctivitis sicca, not specified as Sjogren's, bilateral: Secondary | ICD-10-CM | POA: Diagnosis not present

## 2024-04-22 ENCOUNTER — Other Ambulatory Visit: Payer: Self-pay

## 2024-04-22 MED ORDER — BREZTRI AEROSPHERE 160-9-4.8 MCG/ACT IN AERO: 2.0000 | INHALATION_SPRAY | Freq: Two times a day (BID) | RESPIRATORY_TRACT | 3 refills | Status: DC

## 2024-04-29 ENCOUNTER — Other Ambulatory Visit: Payer: Self-pay | Admitting: Emergency Medicine

## 2024-04-29 ENCOUNTER — Encounter: Payer: Self-pay | Admitting: Emergency Medicine

## 2024-04-29 ENCOUNTER — Ambulatory Visit (INDEPENDENT_AMBULATORY_CARE_PROVIDER_SITE_OTHER): Admitting: Emergency Medicine

## 2024-04-29 ENCOUNTER — Other Ambulatory Visit (HOSPITAL_BASED_OUTPATIENT_CLINIC_OR_DEPARTMENT_OTHER): Payer: Self-pay

## 2024-04-29 VITALS — BP 160/78 | HR 59 | Ht 63.5 in | Wt 166.0 lb

## 2024-04-29 DIAGNOSIS — C3491 Malignant neoplasm of unspecified part of right bronchus or lung: Secondary | ICD-10-CM | POA: Diagnosis not present

## 2024-04-29 DIAGNOSIS — J9611 Chronic respiratory failure with hypoxia: Secondary | ICD-10-CM

## 2024-04-29 DIAGNOSIS — R0602 Shortness of breath: Secondary | ICD-10-CM

## 2024-04-29 DIAGNOSIS — J449 Chronic obstructive pulmonary disease, unspecified: Secondary | ICD-10-CM

## 2024-04-29 MED ORDER — ALBUTEROL SULFATE HFA 108 (90 BASE) MCG/ACT IN AERS: 1.0000 | INHALATION_SPRAY | Freq: Four times a day (QID) | RESPIRATORY_TRACT | 6 refills | Status: DC | PRN

## 2024-04-29 MED ORDER — BREZTRI AEROSPHERE 160-9-4.8 MCG/ACT IN AERO: 2.0000 | INHALATION_SPRAY | Freq: Two times a day (BID) | RESPIRATORY_TRACT | 4 refills | Status: DC

## 2024-04-29 MED ORDER — ALBUTEROL SULFATE (2.5 MG/3ML) 0.083% IN NEBU: 2.5000 mg | INHALATION_SOLUTION | Freq: Four times a day (QID) | RESPIRATORY_TRACT | 6 refills | Status: DC | PRN

## 2024-04-29 NOTE — Patient Instructions (Signed)
 We will plan to continue Breztri  2 puffs twice a day for now.  Rinse and gargle after using.  Please let us  know if you continue to have difficulty with eye dryness.  We could consider changing the Breztri  to an alternative that may cause less dryness at some point.  Please let us  know how the symptoms are doing, certainly if they progress. Keep your albuterol  available use 2 puffs if needed for shortness of breath, chest tightness, wheezing. Keep your flu shot and COVID-19 vaccine up-to-date Continue your oxygen  at 2 L/min as you have been using it Follow with Dr. Baldwin Levee in 12 months or sooner if you have any problems.

## 2024-04-29 NOTE — Progress Notes (Signed)
 Subjective:    Patient ID: Tammy Ball, female    DOB: [birth year], 83 y.o.   MRN: [id number]  HPI  CT chest 12/08/2021 reviewed by me shows no evidence of recurrence.  There was pulmonary arterial enlargement suggestive of PAH.  Echocardiogram 01/09/2016 with normal LV size and function, LVEF 60-65% with grade 1 diastolic dysfunction.  Normal RV size and function.   ROV 01/10/23 --83 year old woman with a history of COPD and right upper lobe squamous cell lung cancer that was treated with right upper lobe lobectomy, chemoradiation 2016.  She then underwent SBRT to a left lower lobe pulmonary nodule 10/2016.  PMH also significant for CAD, hypertension with diastolic dysfunction, renal cell carcinoma. Since I last saw her she was hospitalized for non-STEMI She is having more cough with mucous production. She has wheeze, SOB w exertion. She has been on O2 since her hospitalization in July. She has been on Trelegy, formulary changed. She needs to change to Breztri   CT chest 12/14/2022 reviewed by me, shows no hilar or mediastinal adenopathy, centrilobular emphysema, basilar atelectasis and surgical changes along the right hilum from lobectomy.  There are some patchy opacity left lung base slightly increased from prior with some focal left lower lobe central airway narrowing  ROV 04/29/2024 --Tammy Ball is 83 years old with a history of COPD, right upper lobe lobectomy for squamous cell lung cancer then chemoradiation 2016.  SBRT to a left lower lobe pulmonary nodule 10/2016. PMH renal cell carcinoma, CAD, hypertension with diastolic dysfunction She feels that she has been doing fairly well - no flares. She has been dealing with dry eyes. She is on Breztri . She does feel that she is benefiting - no flares, never needs her albuterol . She is using an eye lubricant.  She keeps her flu and COVID vaccines up to date.   CT scan of the chest 03/16/2024 reviewed by me shows stable postsurgical changes, no evidence of  local recurrence or metastatic disease, left lower lobe bandlike opacity from radiation    Review of Systems  Constitutional:  Negative for fever and unexpected weight change.  HENT:  Negative for congestion, dental problem, ear pain, nosebleeds, postnasal drip, rhinorrhea, sinus pressure, sneezing, sore throat and trouble swallowing.   Eyes:  Negative for redness and itching.  Respiratory:  Positive for shortness of breath and wheezing. Negative for cough and chest tightness.   Cardiovascular:  Negative for palpitations and leg swelling.  Gastrointestinal:  Negative for nausea and vomiting.  Genitourinary:  Negative for dysuria.  Musculoskeletal:  Negative for joint swelling.  Skin:  Negative for rash.  Allergic/Immunologic: Negative.  Negative for environmental allergies, food allergies and immunocompromised state.  Neurological:  Negative for headaches.  Hematological:  Does not bruise/bleed easily.  Psychiatric/Behavioral:  Negative for dysphoric mood. The patient is nervous/anxious.      Objective:   Physical Exam Vitals:   04/29/24 0915 04/29/24 0917  BP: (!) 164/91 (!) 160/78  Pulse: (!) 59   SpO2: 96%   Weight: 166 lb (75.3 kg)   Height: 5' 3.5 (1.613 m)    Gen: Pleasant, elderly woman, in no distress,  normal affect  ENT: No lesions,  mouth clear,  oropharynx clear, no postnasal drip  Neck: No JVD, no stridor  Lungs: No use of accessory muscles, no crackles or wheezing on normal respiration, good air movement on forced exp as well  Cardiovascular: RRR, early systolic M  Musculoskeletal: No deformities, no cyanosis or clubbing  Neuro: alert, awake,  non focal  Skin: Warm, no lesions or rash      Assessment & Plan:  COPD GOLD B-C Overall doing quite well.  Never needs her albuterol .  No flares.  We did talk about the potential side effect of LAMA that could cause dry eyes.  She is dealing with this.  We talked about possibly changing Breztri  to Symbicort  but  she is getting the Breztri  with patient assistance and does not want to make any big changes unless necessary.  We will plan to continue Breztri  2 puffs twice a day for now.  Rinse and gargle after using.  Please let us  know if you continue to have difficulty with eye dryness.  We could consider changing the Breztri  to an alternative that may cause less dryness at some point.  Please let us  know how the symptoms are doing, certainly if they progress. Keep your albuterol  available use 2 puffs if needed for shortness of breath, chest tightness, wheezing. Keep your flu shot and COVID-19 vaccine up-to-date Follow with Dr. Baldwin Levee in 12 months or sooner if you have any problems.   Non-small cell carcinoma of right lung, stage 2 (HCC) Following with Dr. Marguerita Shih.  Her most recent CT scan of the chest was reassuring.  Now planning to follow annually.  Chronic respiratory failure with hypoxia (HCC) Overall stable.  Plan to continue her current oxygen  2 L/min as she has been using it.    Racheal Buddle, MD, PhD 04/29/2024, 9:40 AM [facility] [phone] or if no answer [phone]

## 2024-04-29 NOTE — Assessment & Plan Note (Signed)
 Overall stable.  Plan to continue her current oxygen  2 L/min as she has been using it.

## 2024-04-29 NOTE — Addendum Note (Signed)
 Addended byGregory Leash, Rushawn Capshaw A on: 04/29/2024 09:48 AM   Modules accepted: Orders

## 2024-04-29 NOTE — Assessment & Plan Note (Signed)
 Following with Dr. Marguerita Shih.  Her most recent CT scan of the chest was reassuring.  Now planning to follow annually.

## 2024-04-29 NOTE — Assessment & Plan Note (Signed)
 Overall doing quite well.  Never needs her albuterol .  No flares.  We did talk about the potential side effect of LAMA that could cause dry eyes.  She is dealing with this.  We talked about possibly changing Breztri  to Symbicort  but she is getting the Breztri  with patient assistance and does not want to make any big changes unless necessary.  We will plan to continue Breztri  2 puffs twice a day for now.  Rinse and gargle after using.  Please let us  know if you continue to have difficulty with eye dryness.  We could consider changing the Breztri  to an alternative that may cause less dryness at some point.  Please let us  know how the symptoms are doing, certainly if they progress. Keep your albuterol  available use 2 puffs if needed for shortness of breath, chest tightness, wheezing. Keep your flu shot and COVID-19 vaccine up-to-date Follow with Dr. Baldwin Levee in 12 months or sooner if you have any problems.

## 2024-05-28 DIAGNOSIS — H18591 Other hereditary corneal dystrophies, right eye: Secondary | ICD-10-CM | POA: Diagnosis not present

## 2024-05-28 DIAGNOSIS — H40023 Open angle with borderline findings, high risk, bilateral: Secondary | ICD-10-CM | POA: Diagnosis not present

## 2024-05-28 DIAGNOSIS — Z961 Presence of intraocular lens: Secondary | ICD-10-CM | POA: Diagnosis not present

## 2024-06-02 ENCOUNTER — Ambulatory Visit (INDEPENDENT_AMBULATORY_CARE_PROVIDER_SITE_OTHER): Payer: Medicare Other

## 2024-06-02 VITALS — Ht 63.0 in | Wt 165.0 lb

## 2024-06-02 DIAGNOSIS — Z0000 Encounter for general adult medical examination without abnormal findings: Secondary | ICD-10-CM

## 2024-06-02 NOTE — Patient Instructions (Addendum)
 Tammy Ball , Thank you for taking time out of your busy schedule to complete your Annual Wellness Visit with me. I enjoyed our conversation and look forward to speaking with you again next year. I, as well as your care team,  appreciate your ongoing commitment to your health goals. Please review the following plan we discussed and let me know if I can assist you in the future. Your Game plan/ To Do List    Follow up Visits: Next Medicare AWV with our clinical staff: 06/10/2025 - in person   Have you seen your provider in the last 6 months (3 months if uncontrolled diabetes)? No Next Office Visit with your provider: 06/16/2024  Clinician Recommendations:  Aim for 30 minutes of exercise or brisk walking, 6-8 glasses of water , and 5 servings of fruits and vegetables each day. Educated and advised on getting the Tdap (Tetenus), Shingles, and Hepatitis B vaccines in 2025.      This is a list of the screening recommended for you and due dates:  Health Maintenance  Topic Date Due   Zoster (Shingles) Vaccine (1 of 2) 09/18/1960   Hepatitis B Vaccine (2 of 3 - 19+ 3-dose series) 12/10/1998   COVID-19 Vaccine (6 - 2024-25 season) 07/14/2023   DTaP/Tdap/Td vaccine (2 - Td or Tdap) 12/03/2023   Flu Shot  06/12/2024   Medicare Annual Wellness Visit  06/02/2025   Pneumococcal Vaccine for age over 80  Completed   DEXA scan (bone density measurement)  Completed   HPV Vaccine  Aged Out   Meningitis B Vaccine  Aged Out   Colon Cancer Screening  Discontinued    Advanced directives: (Copy Requested) Please bring a copy of your health care power of attorney and living will to the office to be added to your chart at your convenience. You can mail to [facility] [address] [place], [place] [address] or email to [email or web address] Advance Care Planning is important because it:  [x]  Makes sure you receive the medical care that is consistent with your values, goals, and  preferences  [x]  It provides guidance to your family and loved ones and reduces their decisional burden about whether or not they are making the right decisions based on your wishes.  Follow the link provided in your after visit summary or read over the paperwork we have mailed to you to help you started getting your Advance Directives in place. If you need assistance in completing these, please reach out to us  so that we can help you!   Managing Pain Without Opioids Opioids are strong medicines used to treat moderate to severe pain. For some people, especially those who have long-term (chronic) pain, opioids may not be the best choice for pain management due to: Side effects like nausea, constipation, and sleepiness. The risk of addiction (opioid use disorder). The longer you take opioids, the greater your risk of addiction. Pain that lasts for more than 3 months is called chronic pain. Managing chronic pain usually requires more than one approach and is often provided by a team of health care providers working together (multidisciplinary approach). Pain management may be done at a pain management center or pain clinic. How to manage pain without the use of opioids Use non-opioid medicines Non-opioid medicines for pain may include: Over-the-counter or prescription non-steroidal anti-inflammatory drugs (NSAIDs). These may be the first medicines used for pain. They work well for muscle and bone pain, and they reduce swelling. Acetaminophen . This over-the-counter medicine  may work well for milder pain but not swelling. Antidepressants. These may be used to treat chronic pain. A certain type of antidepressant (tricyclics) is often used. These medicines are given in lower doses for pain than when used for depression. Anticonvulsants. These are usually used to treat seizures but may also reduce nerve (neuropathic) pain. Muscle relaxants. These relieve pain caused by sudden muscle tightening  (spasms). You may also use a pain medicine that is applied to the skin as a patch, cream, or gel (topical analgesic), such as a numbing medicine. These may cause fewer side effects than medicines taken by mouth. Do certain therapies as directed Some therapies can help with pain management. They include: Physical therapy. You will do exercises to gain strength and flexibility. A physical therapist may teach you exercises to move and stretch parts of your body that are weak, stiff, or painful. You can learn these exercises at physical therapy visits and practice them at home. Physical therapy may also involve: Massage. Heat wraps or applying heat or cold to affected areas. Electrical signals that interrupt pain signals (transcutaneous electrical nerve stimulation, TENS). Weak lasers that reduce pain and swelling (low-level laser therapy). Signals from your body that help you learn to regulate pain (biofeedback). Occupational therapy. This helps you to learn ways to function at home and work with less pain. Recreational therapy. This involves trying new activities or hobbies, such as a physical activity or drawing. Mental health therapy, including: Cognitive behavioral therapy (CBT). This helps you learn coping skills for dealing with pain. Acceptance and commitment therapy (ACT) to change the way you think and react to pain. Relaxation therapies, including muscle relaxation exercises and mindfulness-based stress reduction. Pain management counseling. This may be individual, family, or group counseling.  Receive medical treatments Medical treatments for pain management include: Nerve block injections. These may include a pain blocker and anti-inflammatory medicines. You may have injections: Near the spine to relieve chronic back or neck pain. Into joints to relieve back or joint pain. Into nerve areas that supply a painful area to relieve body pain. Into muscles (trigger point injections) to  relieve some painful muscle conditions. A medical device placed near your spine to help block pain signals and relieve nerve pain or chronic back pain (spinal cord stimulation device). Acupuncture. Follow these instructions at home Medicines Take over-the-counter and prescription medicines only as told by your health care provider. If you are taking pain medicine, ask your health care providers about possible side effects to watch out for. Do not drive or use heavy machinery while taking prescription opioid pain medicine. Lifestyle  Do not use drugs or alcohol  to reduce pain. If you drink alcohol , limit how much you have to: 0-1 drink a day for women who are not pregnant. 0-2 drinks a day for men. Know how much alcohol  is in a drink. In the [place] one drink equals one 12 oz bottle of beer (355 mL), one 5 oz glass of wine (148 mL), or one 1 oz glass of hard liquor (44 mL). Do not use any products that contain nicotine or tobacco. These products include cigarettes, chewing tobacco, and vaping devices, such as e-cigarettes. If you need help quitting, ask your health care provider. Eat a healthy diet and maintain a healthy weight. Poor diet and excess weight may make pain worse. Eat foods that are high in fiber. These include fresh fruits and vegetables, whole grains, and beans. Limit foods that are high in fat and processed sugars, such  as fried and sweet foods. Exercise regularly. Exercise lowers stress and may help relieve pain. Ask your health care provider what activities and exercises are safe for you. If your health care provider approves, join an exercise class that combines movement and stress reduction. Examples include yoga and tai chi. Get enough sleep. Lack of sleep may make pain worse. Lower stress as much as possible. Practice stress reduction techniques as told by your therapist. General instructions Work with all your pain management providers to find the treatments that work  best for you. You are an important member of your pain management team. There are many things you can do to reduce pain on your own. Consider joining an online or in-person support group for people who have chronic pain. Keep all follow-up visits. This is important. Where to find more information You can find more information about managing pain without opioids from: American Academy of Pain Medicine: painmed.org Institute for Chronic Pain: instituteforchronicpain.org American Chronic Pain Association: theacpa.org Contact a health care provider if: You have side effects from pain medicine. Your pain gets worse or does not get better with treatments or home therapy. You are struggling with anxiety or depression. Summary Many types of pain can be managed without opioids. Chronic pain may respond better to pain management without opioids. Pain is best managed when you and a team of health care providers work together. Pain management without opioids may include non-opioid medicines, medical treatments, physical therapy, mental health therapy, and lifestyle changes. Tell your health care providers if your pain gets worse or is not being managed well enough. This information is not intended to replace advice given to you by your health care provider. Make sure you discuss any questions you have with your health care provider. Document Revised: 02/08/2021 Document Reviewed: 02/08/2021 Elsevier Patient Education  2024 [employer].

## 2024-06-02 NOTE — Progress Notes (Signed)
 Subjective:   Tammy Ball is a 83 y.o. who presents for a Medicare Wellness preventive visit.  As a reminder, Annual Wellness Visits don't include a physical exam, and some assessments may be limited, especially if this visit is performed virtually. We may recommend an in-person follow-up visit with your provider if needed.  Visit Complete: Virtual I connected with  Tammy Ball Smoke on 06/02/24 by a audio enabled telemedicine application and verified that I am speaking with the correct person using two identifiers.  Patient Location: Home  Provider Location: Office/Clinic  I discussed the limitations of evaluation and management by telemedicine. The patient expressed understanding and agreed to proceed.  Vital Signs: Because this visit was a virtual/telehealth visit, some criteria may be missing or patient reported. Any vitals not documented were not able to be obtained and vitals that have been documented are patient reported.  VideoDeclined- This patient declined Librarian, academic. Therefore the visit was completed with audio only.  Persons Participating in Visit: Patient.  AWV Questionnaire: Yes: Patient Medicare AWV questionnaire was completed by the patient on 05/29/2024; I have confirmed that all information answered by patient is correct and no changes since this date.  Cardiac Risk Factors include: advanced age (>78men, >55 women);dyslipidemia;hypertension     Objective:    Today's Vitals   06/02/24 0931  Weight: 165 lb (74.8 kg)  Height: 5' 3 (1.6 m)   Body mass index is 29.23 kg/m.     06/02/2024    9:30 AM 05/30/2023    9:47 AM 12/18/2022    9:22 AM 11/15/2022    8:40 AM 06/10/2022   10:06 AM 06/09/2022    7:19 AM 05/28/2022    3:34 PM  Advanced Directives  Does Patient Have a Medical Advance Directive? Yes Yes Yes Yes Yes Yes Yes  Type of Estate agent of [place];Living will Healthcare Power of Tammy Ball;Living  will Living will Living will Healthcare Power of Dou[place] Ball;Living will Healthcare Power of Tammy Ball;Living will Healthcare Power of Tammy Ball;Living will  Does patient want to make changes to medical advance directive?     No - Patient declined    Copy of Healthcare Power of Attorney in Chart? No - copy requested No - copy requested  No - copy requested   No - copy requested  Would patient like information on creating a medical advance directive?      No - Patient declined     Current Medications (verified) Outpatient Encounter Medications as of 06/02/2024  Medication Sig   acetaminophen  (TYLENOL ) 500 MG tablet Take 500 mg by mouth every 6 (six) hours as needed for headache or moderate pain (pain).   albuterol  (PROVENTIL ) (2.5 MG/3ML) 0.083% nebulizer solution INHALE 3 ML BY NEBULIZATION EVERY 6 HOURS AS NEEDED FOR WHEEZING OR SHORTNESS OF BREATH   albuterol  (VENTOLIN  HFA) 108 (90 Base) MCG/ACT inhaler Inhale 1-2 puffs into the lungs every 6 (six) hours as needed for wheezing or shortness of breath.   Ascorbic Acid  (VITAMIN C) 1000 MG tablet Take 1,000 mg by mouth every morning.   aspirin  EC 81 MG tablet Take 81 mg by mouth every morning.   atorvastatin  (LIPITOR) 40 MG tablet TAKE 1 TABLET BY MOUTH EVERY DAY   budesonide -glycopyrrolate -formoterol  (BREZTRI  AEROSPHERE) 160-9-4.8 MCG/ACT AERO inhaler Inhale 2 puffs into the lungs in the morning and at bedtime.   Calcium  Carbonate Antacid (TUMS PO) Take 1 tablet by mouth 4 (four) times daily as needed (heartburn).   Carboxymethylcellulose Sodium (  ARTIFICIAL TEARS OP) Place 1 drop into both eyes daily as needed (dry eyes).   carvedilol  (COREG ) 25 MG tablet Take 1 tablet (25 mg total) by mouth 2 (two) times daily.   cholecalciferol  (VITAMIN D3) 25 MCG (1000 UNIT) tablet Take 1,000 Units by mouth daily.   diphenhydrAMINE  (BENADRYL ) 25 mg capsule Take 25 mg by mouth at bedtime as needed for allergies.   Guaifenesin  (MUCINEX  MAXIMUM STRENGTH) 1200 MG TB12  Take 1,200 mg by mouth every morning.   loperamide  (IMODIUM ) 2 MG capsule Take 1 capsule (2 mg total) by mouth 4 (four) times daily as needed for diarrhea or loose stools.   losartan  (COZAAR ) 100 MG tablet TAKE 1 TABLET BY MOUTH EVERY DAY   Omega-3 Fatty Acids  (FISH OIL) 1200 MG CAPS Take 1,200 mg by mouth every morning.   ondansetron  (ZOFRAN -ODT) 4 MG disintegrating tablet Take 1 tablet (4 mg total) by mouth every 8 (eight) hours as needed for nausea or vomiting.   OXYGEN  Inhale 2 L into the lungs as needed (to keep SATS at or above 90).   sodium chloride  (OCEAN) 0.65 % SOLN nasal spray Place 1 spray into both nostrils at bedtime as needed for congestion.   traMADol  (ULTRAM ) 50 MG tablet Take 1 tablet (50 mg total) by mouth every 6 (six) hours as needed (BACK PAIN).   [DISCONTINUED] pantoprazole  (PROTONIX ) 40 MG tablet Take 1 tablet (40 mg total) by mouth daily.   No facility-administered encounter medications on file as of 06/02/2024.    Allergies (verified) Bee venom, Amlodipine , Levofloxacin, Alendronate , and Hctz [hydrochlorothiazide ]   History: Past Medical History:  Diagnosis Date   Anemia    was while doing chemo   Anxiety    Arthritis    Asthma    Chemotherapy-induced neuropathy (HCC) 11/01/2015   CHF (congestive heart failure) (HCC)    Claustrophobia    COPD (chronic obstructive pulmonary disease) (HCC)    Depression    Encounter for antineoplastic chemotherapy 02/09/2016   GERD (gastroesophageal reflux disease)    Headache    prior to menopause   Heart murmur    History of echocardiogram    Echo 2/17: EF 60-65%, grade 1 diastolic dysfunction, mild MR, trivial pericardial effusion   History of nuclear stress test    Myoview 2/17: no ischemia or scar, EF 79%; low risk   History of radiation therapy 10/30/16-11/06/16   Hyperlipidemia    Hypertension    Insomnia 05/16/2016   Myocardial infarction (HCC)    Non-small cell carcinoma of right lung, stage 2 (HCC)  10/03/2015   Oxygen  deficiency    Pneumonia    Radiation 02/29/16-04/10/16   50.4 Gy to right central chest   Renal cell carcinoma (HCC)    L nephrectomy  in 2012   Past Surgical History:  Procedure Laterality Date   BACK SURGERY     cervical 1991   BIOPSY  11/15/2022   Procedure: BIOPSY;  Surgeon: San Sandor GAILS, DO;  Location: [facility];  Service: Gastroenterology;;   COLONOSCOPY WITH PROPOFOL  N/A 11/15/2022   Procedure: COLONOSCOPY WITH PROPOFOL ;  Surgeon: San Sandor GAILS, DO;  Location: [facility];  Service: Gastroenterology;  Laterality: N/A;   ESOPHAGOGASTRODUODENOSCOPY (EGD) WITH PROPOFOL  N/A 11/15/2022   Procedure: ESOPHAGOGASTRODUODENOSCOPY (EGD) WITH PROPOFOL ;  Surgeon: San Sandor GAILS, DO;  Location: [facility];  Service: Gastroenterology;  Laterality: N/A;   EYE SURGERY     IR GENERIC HISTORICAL  01/16/2017   IR RADIOLOGIST EVAL & MGMT 01/16/2017 [facility]  IR GENERIC HISTORICAL  01/31/2017   IR FLUORO GUIDED NEEDLE PLC ASPIRATION/INJECTION LOC 01/31/2017 [name], MD [facility]   IR GENERIC HISTORICAL  01/31/2017   IR VERTEBROPLASTY CERV/THOR BX INC UNI/BIL INC/INJECT/IMAGING 01/31/2017 [name], MD [facility]   IR GENERIC HISTORICAL  01/31/2017   IR VERTEBROPLASTY EA ADDL (T&LS) BX INC UNI/BIL INC INJECT/IMAGING 01/31/2017 [name], MD [facility]   kidney cancer     LOBECTOMY Right    RUL   NEPHRECTOMY  2012   POLYPECTOMY  11/15/2022   Procedure: POLYPECTOMY;  Surgeon: San Sandor GAILS, DO;  Location: [facility];  Service: Gastroenterology;;   SPINE SURGERY     TUBAL LIGATION     VIDEO ASSISTED THORACOSCOPY (VATS)/WEDGE RESECTION Right 01/18/2016   Procedure: VIDEO ASSISTED THORACOSCOPY (VATS)/LUNG RESECTION, THOROCOTOMY, RIGHT UPPER LOBECTOMY, LYMPH NODE DISSECTION, PLACEMENT OF ON Q;  Surgeon: [name], MD;  Location: [facility];  Service: Thoracic;  Laterality: Right;   VIDEO BRONCHOSCOPY Bilateral 09/20/2015    Procedure: VIDEO BRONCHOSCOPY WITHOUT FLUORO;  Surgeon: [name], MD;  Location: [facility];  Service: Cardiopulmonary;  Laterality: Bilateral;   VIDEO BRONCHOSCOPY N/A 01/18/2016   Procedure: VIDEO BRONCHOSCOPY;  Surgeon: [name], MD;  Location: [facility] OR;  Service: Thoracic;  Laterality: N/A;   Family History  Problem Relation Age of Onset   Cancer Mother        Bladder Cancer   Hypertension Mother    CAD Mother 55   Heart disease Sister    Breast cancer Sister    Birth defects Sister    Obesity Brother    Cancer Maternal Grandmother    Heart attack Daughter 86       s/p CABG   Glaucoma Daughter    Colon cancer Neg Hx    Colon polyps Neg Hx    Esophageal cancer Neg Hx    Rectal cancer Neg Hx    Stomach cancer Neg Hx    Social History   Socioeconomic History   Marital status: Widowed    Spouse name: Not on file   Number of children: 4   Years of education: Not on file   Highest education level: Bachelor's degree (e.g., BA, AB, BS)  Occupational History   Not on file  Tobacco Use   Smoking status: Former    Current packs/day: 0.00    Average packs/day: 1 pack/day for 50.0 years (50.0 ttl pk-yrs)    Types: Cigarettes    Start date: 03/15/1961    Quit date: 03/16/2011    Years since quitting: 13.2   Smokeless tobacco: Never   Tobacco comments:    last 4-5 years of smoking, smoked 0.5 pack/day   Vaping Use   Vaping status: Never Used  Substance and Sexual Activity   Alcohol  use: No    Alcohol /week: 0.0 standard drinks of alcohol    Drug use: No   Sexual activity: Not Currently  Other Topics Concern   Not on file  Social History Narrative   Marital status: divorced; not dating in 2019.      Children: 4 children; 3 grandchildren adult; 4 gg.      Lives: alone in house      Employment: part-time work; substance abuse counselor; [employer].      Tobacco: quit smoking 2012; smoked 45 years      Alcohol : none      Drugs: none      Exercise: none in  2019; due to LEFT sciatica.  ADLs: independent with ADLs; drives.       Advanced Directives: YES: HCPOA: Nicholas Martinez/son.  FULL CODE but no prolonged measures.      Occupation: Substance Abuse Administrator, [employer]   No exercise** Merged History Encounter **       ** Data from: 12/14/11 Enc Dept: [facility]       ** Data from: 12/17/11 Enc Dept: [facility]   Substance abuse counselor   Husband deceased   4 great grandchildren   Son works in same substance abuse counseling center as patient   Social Drivers of Corporate investment banker Strain: Medium Risk (06/02/2024)   Overall Financial Resource Strain (CARDIA)    Difficulty of Paying Living Expenses: Somewhat hard  Food Insecurity: No Food Insecurity (06/02/2024)   Hunger Vital Sign    Worried About Running Out of Food in the Last Year: Never true    Ran Out of Food in the Last Year: Never true  Transportation Needs: Unmet Transportation Needs (06/02/2024)   PRAPARE - Transportation    Lack of Transportation (Medical): No    Lack of Transportation (Non-Medical): Yes  Physical Activity: Inactive (06/02/2024)   Exercise Vital Sign    Days of Exercise per Week: 0 days    Minutes of Exercise per Session: 0 min  Stress: No Stress Concern Present (06/02/2024)   [employer] of Occupational Health - Occupational Stress Questionnaire    Feeling of Stress: Only a little  Social Connections: Socially Isolated (06/02/2024)   Social Connection and Isolation Panel    Frequency of Communication with Friends and Family: More than three times a week    Frequency of Social Gatherings with Friends and Family: More than three times a week    Attends Religious Services: Never    Database administrator or Organizations: No    Attends Engineer, structural: Never    Marital Status: Divorced    Tobacco Counseling Counseling given: No Tobacco comments: last 4-5 years of smoking, smoked  0.5 pack/day     Clinical Intake:  Pre-visit preparation completed: Yes  Pain : No/denies pain     BMI - recorded: 29.23 Nutritional Status: BMI 25 -29 Overweight Nutritional Risks: None Diabetes: No  No results found for: HGBA1C   How often do you need to have someone help you when you read instructions, pamphlets, or other written materials from your doctor or pharmacy?: 1 - Never  Interpreter Needed?: No  Information entered by :: Verdie Saba, CMA   Activities of Daily Living     06/02/2024    9:36 AM  In your present state of health, do you have any difficulty performing the following activities:  Hearing? 0  Vision? 0  Difficulty concentrating or making decisions? 0  Walking or climbing stairs? 0  Dressing or bathing? 0  Doing errands, shopping? 0  Preparing Food and eating ? N  Using the Toilet? N  In the past six months, have you accidently leaked urine? Y  Comment wears pads/depends  Do you have problems with loss of bowel control? N  Managing your Medications? N  Managing your Finances? N  Housekeeping or managing your Housekeeping? N    Patient Care Team: Purcell [name], MD as PCP - General (Internal Medicine) Alvan Ronal BRAVO, MD (Inactive) as PCP - Cardiology (Cardiology) Sherrod Sherrod, MD as Consulting Physician (Oncology) Army Dallas NOVAK, MD (Inactive) as Consulting Physician (Cardiothoracic  Surgery) Szabat, Toribio BROCKS, [facility] (Inactive) as Pharmacist (Pharmacist) [name], MD as Consulting Physician (Pulmonary Disease) Octavia, Charlie Hamilton, MD as Consulting Physician (Ophthalmology)  I have updated your Care Teams any recent Medical Services you may have received from other providers in the past year.     Assessment:   This is a routine wellness examination for Haset.  Hearing/Vision screen Hearing Screening - Comments:: Denies hearing difficulties   Vision Screening - Comments:: Wears rx glasses - up to date with  routine eye exams with Dr Octavia   Goals Addressed               This Visit's Progress     Patient Stated (pt-stated)        Patient stated she has been watching her diet (less sodium) & lose weight (get down to 145lb)       Depression Screen     06/02/2024    9:38 AM 08/26/2023    8:34 AM 05/30/2023    9:45 AM 03/20/2023   10:33 AM 09/20/2022    9:47 AM 06/19/2022    1:23 PM 06/19/2022    1:20 PM  PHQ 2/9 Scores  PHQ - 2 Score 3 0 1 0 0 1 1  PHQ- 9 Score 10  2   5      Fall Risk     06/02/2024    9:36 AM 08/26/2023    8:33 AM 05/30/2023    9:48 AM 05/26/2023    8:55 AM 03/20/2023   10:33 AM  Fall Risk   Falls in the past year? 0 0 0 0 0  Number falls in past yr: 0 0 0 0 0  Injury with Fall? 0 0 0 0 0  Risk for fall due to : No Fall Risks;Impaired balance/gait No Fall Risks No Fall Risks  No Fall Risks  Risk for fall due to: Comment uses a cane/walker      Follow up Falls evaluation completed;Falls prevention discussed Falls evaluation completed Falls prevention discussed  Falls evaluation completed    MEDICARE RISK AT HOME:  Medicare Risk at Home Any stairs in or around the home?: Yes (outside) If so, are there any without handrails?: No Home free of loose throw rugs in walkways, pet beds, electrical cords, etc?: Yes Adequate lighting in your home to reduce risk of falls?: Yes Life alert?: No Use of a cane, walker or w/c?: Yes (cane/walker) Grab bars in the bathroom?: Yes Shower chair or bench in shower?: No Elevated toilet seat or a handicapped toilet?: No  TIMED UP AND GO:  Was the test performed?  No  Cognitive Function: 6CIT completed        06/02/2024    9:42 AM 05/30/2023    9:49 AM 05/28/2022    3:41 PM 03/29/2020    2:27 PM 11/26/2018   10:57 AM  6CIT Screen  What Year? 0 points 0 points 0 points 0 points 0 points  What month? 0 points 0 points 0 points 0 points 0 points  What time? 0 points 0 points 0 points 0 points 0 points  Count back from 20 0  points 0 points 0 points 0 points 0 points  Months in reverse 0 points 0 points 0 points 0 points 0 points  Repeat phrase 2 points 0 points 0 points 0 points 0 points  Total Score 2 points 0 points 0 points 0 points 0 points    Immunizations Immunization History  Administered Date(s) Administered  Fluad Quad(high Dose 65+) 07/18/2019, 07/27/2023   Hepatitis B 11/12/1998   Influenza Split 09/12/2012   Influenza, High Dose Seasonal PF 08/23/2017, 08/23/2018   Influenza,inj,Quad PF,6+ Mos 07/18/2013   Influenza-Unspecified 07/29/2014, 07/23/2015, 08/25/2016, 07/28/2020, 08/21/2021   PFIZER(Purple Top)SARS-COV-2 Vaccination 12/24/2019, 01/18/2020, 05/07/2020, 03/23/2021, 08/21/2021   Pneumococcal Conjugate-13 07/29/2015   Pneumococcal Polysaccharide-23 11/12/2000, 10/23/2012, 08/23/2017   Pneumococcal-Unspecified 10/23/2012, 08/23/2017   Tdap 12/02/2013   Zoster, Live 12/13/2010    Screening Tests Health Maintenance  Topic Date Due   Zoster Vaccines- Shingrix (1 of 2) 09[birth year]18/1960   Hepatitis B Vaccines (2 of 3 - 19+ 3-dose series) 12/10/1998   COVID-19 Vaccine (6 - 2024-25 season) 07/14/2023   DTaP/Tdap/Td (2 - Td or Tdap) 12/03/2023   INFLUENZA VACCINE  06/12/2024   Medicare Annual Wellness (AWV)  06/02/2025   Pneumococcal Vaccine: 50+ Years  Completed   DEXA SCAN  Completed   HPV VACCINES  Aged Out   Meningococcal B Vaccine  Aged Out   Colonoscopy  Discontinued    Health Maintenance  Health Maintenance Due  Topic Date Due   Zoster Vaccines- Shingrix (1 of 2) 09/18/1962   Hepatitis B Vaccines (2 of 3 - 19+ 3-dose series) 12/10/1998   COVID-19 Vaccine (6 - 2024-25 season) 07/14/2023   DTaP/Tdap/Td (2 - Td or Tdap) 12/03/2023   Health Maintenance Items Addressed:06/02/2024  Hepatitis B vaccines status: Patient stated she may have had these vaccines (3 vaccines series) at her former employer (worked [phone]) but unable to obtain document due to employer has  closed.  Additional Screening:  Vision Screening: Recommended annual ophthalmology exams for early detection of glaucoma and other disorders of the eye. Would you like a referral to an eye doctor? No    Dental Screening: Recommended annual dental exams for proper oral hygiene  Community Resource Referral / Chronic Care Management: CRR required this visit?  No   CCM required this visit?  No   Plan:    I have personally reviewed and noted the following in the patient's chart:   Medical and social history Use of alcohol , tobacco or illicit drugs  Current medications and supplements including opioid prescriptions. Patient is currently taking opioid prescriptions. Information provided to patient regarding non-opioid alternatives. Patient advised to discuss non-opioid treatment plan with their provider. Functional ability and status Nutritional status Physical activity Advanced directives List of other physicians Hospitalizations, surgeries, and ER visits in previous 12 months Vitals Screenings to include cognitive, depression, and falls Referrals and appointments  In addition, I have reviewed and discussed with patient certain preventive protocols, quality metrics, and best practice recommendations. A written personalized care plan for preventive services as well as general preventive health recommendations were provided to patient.   Verdie CHRISTELLA Saba, CMA   06/02/2024   After Visit Summary: (MyChart) Due to this being a telephonic visit, the after visit summary with patients personalized plan was offered to patient via MyChart   Notes: Scheduled an appt for pt to see PCP on 06/16/2024 for a 1-yr f/u on meds/labs, etc.

## 2024-06-16 ENCOUNTER — Ambulatory Visit (INDEPENDENT_AMBULATORY_CARE_PROVIDER_SITE_OTHER): Admitting: Emergency Medicine

## 2024-06-16 ENCOUNTER — Encounter: Payer: Self-pay | Admitting: Emergency Medicine

## 2024-06-16 VITALS — BP 142/90 | HR 60 | Temp 98.4°F | Ht 63.0 in | Wt 164.0 lb

## 2024-06-16 DIAGNOSIS — G8929 Other chronic pain: Secondary | ICD-10-CM

## 2024-06-16 DIAGNOSIS — J9611 Chronic respiratory failure with hypoxia: Secondary | ICD-10-CM

## 2024-06-16 DIAGNOSIS — I700 Atherosclerosis of aorta: Secondary | ICD-10-CM | POA: Diagnosis not present

## 2024-06-16 DIAGNOSIS — M545 Low back pain, unspecified: Secondary | ICD-10-CM

## 2024-06-16 DIAGNOSIS — I5032 Chronic diastolic (congestive) heart failure: Secondary | ICD-10-CM | POA: Diagnosis not present

## 2024-06-16 DIAGNOSIS — I10 Essential (primary) hypertension: Secondary | ICD-10-CM | POA: Diagnosis not present

## 2024-06-16 DIAGNOSIS — F5102 Adjustment insomnia: Secondary | ICD-10-CM | POA: Diagnosis not present

## 2024-06-16 DIAGNOSIS — C3491 Malignant neoplasm of unspecified part of right bronchus or lung: Secondary | ICD-10-CM | POA: Diagnosis not present

## 2024-06-16 DIAGNOSIS — N1831 Chronic kidney disease, stage 3a: Secondary | ICD-10-CM | POA: Diagnosis not present

## 2024-06-16 MED ORDER — TRAMADOL HCL 50 MG PO TABS
50.0000 mg | ORAL_TABLET | Freq: Three times a day (TID) | ORAL | 1 refills | Status: AC | PRN
Start: 2024-06-16 — End: 2024-06-21

## 2024-06-16 MED ORDER — TRAZODONE HCL 50 MG PO TABS
25.0000 mg | ORAL_TABLET | Freq: Every evening | ORAL | 1 refills | Status: DC | PRN
Start: 2024-06-16 — End: 2024-09-03

## 2024-06-16 NOTE — Assessment & Plan Note (Signed)
 BP Readings from Last 3 Encounters:  06/16/24 (!) 142/90  04/29/24 (!) 160/78  03/23/24 (!) 162/89  Normal blood pressure readings at home Continue carvedilol  25 mg twice daily and losartan  100 mg daily

## 2024-06-16 NOTE — Assessment & Plan Note (Signed)
Clinically euvolemic. Not retaining fluid. Stable.  No signs of acute CHF

## 2024-06-16 NOTE — Assessment & Plan Note (Signed)
 Following with Dr. Marguerita Shih.  Her most recent CT scan of the chest was reassuring.  Now planning to follow annually.

## 2024-06-16 NOTE — Assessment & Plan Note (Signed)
Advised to stay well-hydrated and avoid NSAIDs. ?

## 2024-06-16 NOTE — Progress Notes (Signed)
 Tammy Ball 83 y.o.   Chief Complaint  Patient presents with   Insomnia   Follow-up    Patient here for yearly follow up. She mentions her b/p has been elevated.  she has been having some leg swelling more in the right than the left. She is also having issues sleeping and wants to go back on trazodone . She also mentions she's been in a lot of pain in her left leg and lower back which sometimes makes it hard for her to walk she wants something to help with the pain.     HISTORY OF PRESENT ILLNESS: This is a 83 y.o. female A1A here for follow-up of chronic medical conditions.  Complaining of persistent insomnia.  Wants to get back on trazodone  History of hypertension Occasional swelling to both legs Also complaining of intermittent low back pain.  Requesting prescription for tramadol  which helps. No other complaints or medical concerns today  Insomnia     Prior to Admission medications   Medication Sig Start Date End Date Taking? Authorizing Provider  acetaminophen  (TYLENOL ) 500 MG tablet Take 500 mg by mouth every 6 (six) hours as needed for headache or moderate pain (pain).   Yes [provider]  albuterol  (PROVENTIL ) (2.5 MG/3ML) 0.083% nebulizer solution INHALE 3 ML BY NEBULIZATION EVERY 6 HOURS AS NEEDED FOR WHEEZING OR SHORTNESS OF BREATH 04/30/24  Yes Byrum, Lamar GORMAN, MD  albuterol  (VENTOLIN  HFA) 108 (90 Base) MCG/ACT inhaler Inhale 1-2 puffs into the lungs every 6 (six) hours as needed for wheezing or shortness of breath. 04/29/24  Yes Shelah Lamar GORMAN, MD  Ascorbic Acid  (VITAMIN C) 1000 MG tablet Take 1,000 mg by mouth every morning.   Yes [provider]  aspirin  EC 81 MG tablet Take 81 mg by mouth every morning.   Yes [provider]  atorvastatin  (LIPITOR) 40 MG tablet TAKE 1 TABLET BY MOUTH EVERY DAY 02/02/24  Yes Timotheus Salm Jose, MD  budesonide -glycopyrrolate -formoterol  (BREZTRI  AEROSPHERE) 160-9-4.8 MCG/ACT AERO inhaler Inhale 2 puffs into  the lungs in the morning and at bedtime. 04/29/24  Yes Shelah Lamar GORMAN, MD  Calcium  Carbonate Antacid (TUMS PO) Take 1 tablet by mouth 4 (four) times daily as needed (heartburn).   Yes [provider]  Carboxymethylcellulose Sodium (ARTIFICIAL TEARS OP) Place 1 drop into both eyes daily as needed (dry eyes).   Yes [provider]  carvedilol  (COREG ) 25 MG tablet Take 1 tablet (25 mg total) by mouth 2 (two) times daily. 04/15/24  Yes [name], [name], NP  cholecalciferol  (VITAMIN D3) 25 MCG (1000 UNIT) tablet Take 1,000 Units by mouth daily.   Yes [provider]  diphenhydrAMINE  (BENADRYL ) 25 mg capsule Take 25 mg by mouth at bedtime as needed for allergies.   Yes [provider]  Guaifenesin  (MUCINEX  MAXIMUM STRENGTH) 1200 MG TB12 Take 1,200 mg by mouth every morning.   Yes [provider]  loperamide  (IMODIUM ) 2 MG capsule Take 1 capsule (2 mg total) by mouth 4 (four) times daily as needed for diarrhea or loose stools. 08/19/23  Yes [name], [name], MD  losartan  (COZAAR ) 100 MG tablet TAKE 1 TABLET BY MOUTH EVERY DAY 02/17/24  Yes Wylee Dorantes, [name], MD  Omega-3 Fatty Acids  (FISH OIL) 1200 MG CAPS Take 1,200 mg by mouth every morning.   Yes [provider]  ondansetron  (ZOFRAN -ODT) 4 MG disintegrating tablet Take 1 tablet (4 mg total) by mouth every 8 (eight) hours as needed for nausea or vomiting. 08/19/23  Yes [name], [name], MD  OXYGEN  Inhale 2 L into the lungs as needed (to keep SATS at or above 90).   Yes [provider]  sodium chloride  (OCEAN) 0.65 % SOLN nasal spray Place 1 spray into both nostrils at bedtime as needed for congestion.   Yes [provider]  traMADol  (ULTRAM ) 50 MG tablet Take 1 tablet (50 mg total) by mouth every 6 (six) hours as needed (BACK PAIN). 09/06/21  Yes Kindle Strohmeier, [name], MD    Allergies  Allergen Reactions   Bee Venom Anaphylaxis, Shortness Of Breath, Swelling and Other (See Comments)     Swelling at site (reaction to bees and wasps)   Amlodipine  Swelling and Other (See Comments)    Swelling of the ankles and hands    Levofloxacin Other (See Comments)    Joint pain    Alendronate  Other (See Comments)    Joint pains / hypercalcemia    Hctz Gerda.Geralds ] Palpitations and Other (See Comments)    Sweating     Patient Active Problem List   Diagnosis Date Noted   Class 1 obesity due to excess calories with serious comorbidity and body mass index (BMI) of 30.0 to 30.9 in adult 03/20/2023   Iron deficiency anemia 11/15/2022   Adenomatous polyp of ascending colon 11/15/2022   Adenomatous polyp of transverse colon 11/15/2022   Adenomatous polyp of descending colon 11/15/2022   Internal hemorrhoids 11/15/2022   History of myocardial infarction 09/20/2022   Gastroesophageal reflux disease without esophagitis 06/19/2022   Epigastric discomfort 06/09/2022   Chronic bilateral low back pain without sciatica 09/06/2021   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) 02/21/2021   Recurrent major depressive disorder, in full remission (HCC) 02/21/2021   Atherosclerosis of aorta (HCC) 02/21/2021   Var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) 02/21/2021   Chronic diastolic CHF (congestive heart failure) (HCC)    Stage 3a chronic kidney disease (HCC) 11/10/2020   Chronic respiratory failure with hypoxia (HCC) 12/26/2018   Age-related osteoporosis with current pathological fracture 11/21/2018   Renal insufficiency 05/20/2018   History of compression fracture of spine 12/10/2016   Insomnia 05/16/2016   Pneumonitis, radiation (HCC) 04/11/2016   S/P lobectomy of lung 01/18/2016   Chemotherapy-induced neuropathy (HCC) 11/01/2015   Non-small cell carcinoma of right lung, stage 2 (HCC) 10/03/2015   Hyperlipidemia 08/14/2013   Spondylolisthesis of lumbar region 07/03/2013   Essential hypertension 12/17/2011   COPD GOLD B-C 12/17/2011   Renal cell cancer (HCC)  12/17/2011   Depression 12/17/2011    Past Medical History:  Diagnosis Date   Anemia    was while doing chemo   Anxiety    Arthritis    Asthma    Chemotherapy-induced neuropathy (HCC) 11/01/2015   CHF (congestive heart failure) (HCC)    Claustrophobia    COPD (chronic obstructive pulmonary disease) (HCC)    Depression    Encounter for antineoplastic chemotherapy 02/09/2016   GERD (gastroesophageal reflux disease)    Headache    prior to menopause   Heart murmur    History of echocardiogram    Echo 2/17: EF 60-65%, grade 1 diastolic dysfunction, mild MR, trivial pericardial effusion   History of nuclear stress test    Myoview 2/17: no ischemia or scar, EF 79%; low risk   History of radiation therapy 10/30/16-11/06/16   Hyperlipidemia    Hypertension    Insomnia 05/16/2016   Myocardial infarction (HCC)    Non-small cell carcinoma of right lung, stage 2 (HCC)  10/03/2015   Oxygen  deficiency    Pneumonia    Radiation 02/29/16-04/10/16   50.4 Gy to right central chest   Renal cell carcinoma (HCC)    L nephrectomy  in 2012    Past Surgical History:  Procedure Laterality Date   BACK SURGERY     cervical 1991   BIOPSY  11/15/2022   Procedure: BIOPSY;  Surgeon: San Sandor GAILS, DO;  Location: [facility];  Service: Gastroenterology;;   COLONOSCOPY WITH PROPOFOL  N/A 11/15/2022   Procedure: COLONOSCOPY WITH PROPOFOL ;  Surgeon: San Sandor GAILS, DO;  Location: [facility];  Service: Gastroenterology;  Laterality: N/A;   ESOPHAGOGASTRODUODENOSCOPY (EGD) WITH PROPOFOL  N/A 11/15/2022   Procedure: ESOPHAGOGASTRODUODENOSCOPY (EGD) WITH PROPOFOL ;  Surgeon: San Sandor GAILS, DO;  Location: [facility];  Service: Gastroenterology;  Laterality: N/A;   EYE SURGERY     IR GENERIC HISTORICAL  01/16/2017   IR RADIOLOGIST EVAL & MGMT 01/16/2017 [facility]   IR GENERIC HISTORICAL  01/31/2017   IR FLUORO GUIDED NEEDLE PLC ASPIRATION/INJECTION LOC 01/31/2017 [name], MD [facility]   IR GENERIC HISTORICAL  01/31/2017   IR VERTEBROPLASTY CERV/THOR BX INC UNI/BIL INC/INJECT/IMAGING 01/31/2017 [name], MD [facility]   IR GENERIC HISTORICAL  01/31/2017   IR VERTEBROPLASTY EA ADDL (T&LS) BX INC UNI/BIL INC INJECT/IMAGING 01/31/2017 [name], MD [facility]   kidney cancer     LOBECTOMY Right    RUL   NEPHRECTOMY  2012   POLYPECTOMY  11/15/2022   Procedure: POLYPECTOMY;  Surgeon: San Sandor GAILS, DO;  Location: [facility];  Service: Gastroenterology;;   SPINE SURGERY     TUBAL LIGATION     VIDEO ASSISTED THORACOSCOPY (VATS)/WEDGE RESECTION Right 01/18/2016   Procedure: VIDEO ASSISTED THORACOSCOPY (VATS)/LUNG RESECTION, THOROCOTOMY, RIGHT UPPER LOBECTOMY, LYMPH NODE DISSECTION, PLACEMENT OF ON Q;  Surgeon: [name], MD;  Location: [facility];  Service: Thoracic;  Laterality: Right;   VIDEO BRONCHOSCOPY Bilateral 09/20/2015   Procedure: VIDEO BRONCHOSCOPY WITHOUT FLUORO;  Surgeon: [name], MD;  Location: [facility];  Service: Cardiopulmonary;  Laterality: Bilateral;   VIDEO BRONCHOSCOPY N/A 01/18/2016   Procedure: VIDEO BRONCHOSCOPY;  Surgeon: [name], MD;  Location: [facility] OR;  Service: Thoracic;  Laterality: N/A;    Social History   Socioeconomic History   Marital status: Widowed    Spouse name: Not on file   Number of children: 4   Years of education: Not on file   Highest education level: Bachelor's degree (e.g., BA, AB, BS)  Occupational History   Not on file  Tobacco Use   Smoking status: Former    Current packs/day: 0.00    Average packs/day: 1 pack/day for 50.0 years (50.0 ttl pk-yrs)    Types: Cigarettes    Start date: 03/15/1961    Quit date: 03/16/2011    Years since quitting: 13.2   Smokeless tobacco: Never   Tobacco comments:    last 4-5 years of smoking, smoked 0.5 pack/day   Vaping Use   Vaping status: Never Used  Substance and Sexual Activity   Alcohol  use: No    Alcohol /week: 0.0 standard drinks  of alcohol    Drug use: No   Sexual activity: Not Currently  Other Topics Concern   Not on file  Social History Narrative   Marital status: divorced; not dating in 2019.      Children: 4 children; 3 grandchildren adult; 4 gg.      Lives: alone in house  Employment: part-time work; substance abuse Veterinary surgeon; [employer].      Tobacco: quit smoking 2012; smoked 45 years      Alcohol : none      Drugs: none      Exercise: none in 2019; due to LEFT sciatica.      ADLs: independent with ADLs; drives.       Advanced Directives: YES: HCPOA: Nicholas Martinez/son.  FULL CODE but no prolonged measures.      Occupation: Substance Abuse Administrator, [employer]   No exercise** Merged History Encounter **       ** Data from: 12/14/11 Enc Dept: [facility]       ** Data from: 12/17/11 Enc Dept: [facility]   Substance abuse counselor   Husband deceased   4 great grandchildren   Son works in same substance abuse counseling center as patient   Social Drivers of Corporate investment banker Strain: Medium Risk (06/02/2024)   Overall Financial Resource Strain (CARDIA)    Difficulty of Paying Living Expenses: Somewhat hard  Food Insecurity: No Food Insecurity (06/02/2024)   Hunger Vital Sign    Worried About Running Out of Food in the Last Year: Never true    Ran Out of Food in the Last Year: Never true  Transportation Needs: Unmet Transportation Needs (06/02/2024)   PRAPARE - Transportation    Lack of Transportation (Medical): No    Lack of Transportation (Non-Medical): Yes  Physical Activity: Inactive (06/02/2024)   Exercise Vital Sign    Days of Exercise per Week: 0 days    Minutes of Exercise per Session: 0 min  Stress: No Stress Concern Present (06/02/2024)   [employer] of Occupational Health - Occupational Stress Questionnaire    Feeling of Stress: Only a little  Social Connections: Socially Isolated (06/02/2024)   Social Connection and  Isolation Panel    Frequency of Communication with Friends and Family: More than three times a week    Frequency of Social Gatherings with Friends and Family: More than three times a week    Attends Religious Services: Never    Database administrator or Organizations: No    Attends Banker Meetings: Never    Marital Status: Divorced  Catering manager Violence: Not At Risk (06/02/2024)   Humiliation, Afraid, Rape, and Kick questionnaire    Fear of Current or Ex-Partner: No    Emotionally Abused: No    Physically Abused: No    Sexually Abused: No    Family History  Problem Relation Age of Onset   Cancer Mother        Bladder Cancer   Hypertension Mother    CAD Mother 27   Heart disease Sister    Breast cancer Sister    Birth defects Sister    Obesity Brother    Cancer Maternal Grandmother    Heart attack Daughter 84       s/p CABG   Glaucoma Daughter    Colon cancer Neg Hx    Colon polyps Neg Hx    Esophageal cancer Neg Hx    Rectal cancer Neg Hx    Stomach cancer Neg Hx      Review of Systems  Constitutional: Negative.  Negative for chills and fever.  HENT: Negative.  Negative for congestion and sore throat.   Respiratory: Negative.  Negative for cough and shortness of breath.   Cardiovascular: Negative.  Negative for chest pain  and palpitations.  Gastrointestinal:  Negative for abdominal pain, diarrhea, nausea and vomiting.  Genitourinary: Negative.  Negative for dysuria and hematuria.  Musculoskeletal:  Positive for back pain.  Skin: Negative.  Negative for rash.  Psychiatric/Behavioral:  The patient has insomnia.   All other systems reviewed and are negative.   Vitals:   06/16/24 0941  BP: (!) 142/90  Pulse: 60  Temp: 98.4 F (36.9 C)  SpO2: 99%    Physical Exam Vitals reviewed.  Constitutional:      Appearance: Normal appearance.  HENT:     Head: Normocephalic.     Mouth/Throat:     Mouth: Mucous membranes are moist.     Pharynx:  Oropharynx is clear.  Eyes:     Extraocular Movements: Extraocular movements intact.     Conjunctiva/sclera: Conjunctivae normal.     Pupils: Pupils are equal, round, and reactive to light.  Cardiovascular:     Rate and Rhythm: Normal rate and regular rhythm.     Pulses: Normal pulses.     Heart sounds: Normal heart sounds.  Pulmonary:     Effort: Pulmonary effort is normal.     Breath sounds: Normal breath sounds.  Abdominal:     Palpations: Abdomen is soft.     Tenderness: There is no abdominal tenderness.  Musculoskeletal:     Cervical back: No tenderness.     Right lower leg: No edema.     Left lower leg: No edema.  Lymphadenopathy:     Cervical: No cervical adenopathy.  Skin:    General: Skin is warm and dry.     Capillary Refill: Capillary refill takes less than 2 seconds.  Neurological:     General: No focal deficit present.     Mental Status: She is alert and oriented to person, place, and time.  Psychiatric:        Mood and Affect: Mood normal.        Behavior: Behavior normal.      ASSESSMENT & PLAN: A total of 44 minutes was spent with the patient and counseling/coordination of care regarding preparing for this visit, review of most recent office visit notes, review of multiple chronic medical conditions and their management, review of all medications, review of most recent bloodwork results, review of health maintenance items, education on nutrition, prognosis, documentation, and need for follow up.   Problem List Items Addressed This Visit       Cardiovascular and Mediastinum   Essential hypertension - Primary   BP Readings from Last 3 Encounters:  06/16/24 (!) 142/90  04/29/24 (!) 160/78  03/23/24 (!) 162/89  Normal blood pressure readings at home Continue carvedilol  25 mg twice daily and losartan  100 mg daily       Chronic diastolic CHF (congestive heart failure) (HCC)   Clinically euvolemic. Not retaining fluid. Stable.  No signs of acute CHF       Atherosclerosis of aorta (HCC)   Continue atorvastatin  40 mg daily        Respiratory   Non-small cell carcinoma of right lung, stage 2 (HCC)   Following with Dr. Sherrod. Her most recent CT scan of the chest was reassuring. Now planning to follow annually.       Chronic respiratory failure with hypoxia (HCC)   Overall stable. Plan to continue her current oxygen  2 L/min as she has been using it.         Genitourinary   Stage 3a chronic kidney disease (HCC)   Advised  to stay well-hydrated and avoid NSAIDs        Other   Insomnia   Active and affecting quality of life Wants to get back on trazodone  New prescription for trazodone  50 mg sent to pharmacy of record today.      Relevant Medications   traZODone  (DESYREL ) 50 MG tablet   Chronic bilateral low back pain without sciatica   Well-controlled with Tylenol .  Takes tramadol  only as needed.      Relevant Medications   traZODone  (DESYREL ) 50 MG tablet   traMADol  (ULTRAM ) 50 MG tablet   Patient Instructions  Health Maintenance After Age 25 After age 82, you are at a higher risk for certain long-term diseases and infections as well as injuries from falls. Falls are a major cause of broken bones and head injuries in people who are older than age 38. Getting regular preventive care can help to keep you healthy and well. Preventive care includes getting regular testing and making lifestyle changes as recommended by your health care provider. Talk with your health care provider about: Which screenings and tests you should have. A screening is a test that checks for a disease when you have no symptoms. A diet and exercise plan that is right for you. What should I know about screenings and tests to prevent falls? Screening and testing are the best ways to find a health problem early. Early diagnosis and treatment give you the best chance of managing medical conditions that are common after age 52. Certain conditions and  lifestyle choices may make you more likely to have a fall. Your health care provider may recommend: Regular vision checks. Poor vision and conditions such as cataracts can make you more likely to have a fall. If you wear glasses, make sure to get your prescription updated if your vision changes. Medicine review. Work with your health care provider to regularly review all of the medicines you are taking, including over-the-counter medicines. Ask your health care provider about any side effects that may make you more likely to have a fall. Tell your health care provider if any medicines that you take make you feel dizzy or sleepy. Strength and balance checks. Your health care provider may recommend certain tests to check your strength and balance while standing, walking, or changing positions. Foot health exam. Foot pain and numbness, as well as not wearing proper footwear, can make you more likely to have a fall. Screenings, including: Osteoporosis screening. Osteoporosis is a condition that causes the bones to get weaker and break more easily. Blood pressure screening. Blood pressure changes and medicines to control blood pressure can make you feel dizzy. Depression screening. You may be more likely to have a fall if you have a fear of falling, feel depressed, or feel unable to do activities that you used to do. Alcohol  use screening. Using too much alcohol  can affect your balance and may make you more likely to have a fall. Follow these instructions at home: Lifestyle Do not drink alcohol  if: Your health care provider tells you not to drink. If you drink alcohol : Limit how much you have to: 0-1 drink a day for women. 0-2 drinks a day for men. Know how much alcohol  is in your drink. In the [place] one drink equals one 12 oz bottle of beer (355 mL), one 5 oz glass of wine (148 mL), or one 1 oz glass of hard liquor (44 mL). Do not use any products that contain nicotine or tobacco. These products  include cigarettes, chewing tobacco, and vaping devices, such as e-cigarettes. If you need help quitting, ask your health care provider. Activity  Follow a regular exercise program to stay fit. This will help you maintain your balance. Ask your health care provider what types of exercise are appropriate for you. If you need a cane or walker, use it as recommended by your health care provider. Wear supportive shoes that have nonskid soles. Safety  Remove any tripping hazards, such as rugs, cords, and clutter. Install safety equipment such as grab bars in bathrooms and safety rails on stairs. Keep rooms and walkways well-lit. General instructions Talk with your health care provider about your risks for falling. Tell your health care provider if: You fall. Be sure to tell your health care provider about all falls, even ones that seem minor. You feel dizzy, tiredness (fatigue), or off-balance. Take over-the-counter and prescription medicines only as told by your health care provider. These include supplements. Eat a healthy diet and maintain a healthy weight. A healthy diet includes low-fat dairy products, low-fat (lean) meats, and fiber from whole grains, beans, and lots of fruits and vegetables. Stay current with your vaccines. Schedule regular health, dental, and eye exams. Summary Having a healthy lifestyle and getting preventive care can help to protect your health and wellness after age 28. Screening and testing are the best way to find a health problem early and help you avoid having a fall. Early diagnosis and treatment give you the best chance for managing medical conditions that are more common for people who are older than age 71. Falls are a major cause of broken bones and head injuries in people who are older than age 40. Take precautions to prevent a fall at home. Work with your health care provider to learn what changes you can make to improve your health and wellness and to prevent  falls. This information is not intended to replace advice given to you by your health care provider. Make sure you discuss any questions you have with your health care provider. Document Revised: 03/20/2021 Document Reviewed: 03/20/2021 Elsevier Patient Education  2024 Elsevier Inc.    Emil Schaumann, MD [facility]

## 2024-06-16 NOTE — Assessment & Plan Note (Signed)
 Overall stable.  Plan to continue her current oxygen  2 L/min as she has been using it.

## 2024-06-16 NOTE — Assessment & Plan Note (Signed)
Well-controlled with Tylenol.  Takes tramadol only as needed.

## 2024-06-16 NOTE — Patient Instructions (Signed)
 Health Maintenance After Age 81 After age 4, you are at a higher risk for certain long-term diseases and infections as well as injuries from falls. Falls are a major cause of broken bones and head injuries in people who are older than age 47. Getting regular preventive care can help to keep you healthy and well. Preventive care includes getting regular testing and making lifestyle changes as recommended by your health care provider. Talk with your health care provider about: Which screenings and tests you should have. A screening is a test that checks for a disease when you have no symptoms. A diet and exercise plan that is right for you. What should I know about screenings and tests to prevent falls? Screening and testing are the best ways to find a health problem early. Early diagnosis and treatment give you the best chance of managing medical conditions that are common after age 37. Certain conditions and lifestyle choices may make you more likely to have a fall. Your health care provider may recommend: Regular vision checks. Poor vision and conditions such as cataracts can make you more likely to have a fall. If you wear glasses, make sure to get your prescription updated if your vision changes. Medicine review. Work with your health care provider to regularly review all of the medicines you are taking, including over-the-counter medicines. Ask your health care provider about any side effects that may make you more likely to have a fall. Tell your health care provider if any medicines that you take make you feel dizzy or sleepy. Strength and balance checks. Your health care provider may recommend certain tests to check your strength and balance while standing, walking, or changing positions. Foot health exam. Foot pain and numbness, as well as not wearing proper footwear, can make you more likely to have a fall. Screenings, including: Osteoporosis screening. Osteoporosis is a condition that causes  the bones to get weaker and break more easily. Blood pressure screening. Blood pressure changes and medicines to control blood pressure can make you feel dizzy. Depression screening. You may be more likely to have a fall if you have a fear of falling, feel depressed, or feel unable to do activities that you used to do. Alcohol use screening. Using too much alcohol can affect your balance and may make you more likely to have a fall. Follow these instructions at home: Lifestyle Do not drink alcohol if: Your health care provider tells you not to drink. If you drink alcohol: Limit how much you have to: 0-1 drink a day for women. 0-2 drinks a day for men. Know how much alcohol is in your drink. In the [place] one drink equals one 12 oz bottle of beer (355 mL), one 5 oz glass of wine (148 mL), or one 1 oz glass of hard liquor (44 mL). Do not use any products that contain nicotine or tobacco. These products include cigarettes, chewing tobacco, and vaping devices, such as e-cigarettes. If you need help quitting, ask your health care provider. Activity  Follow a regular exercise program to stay fit. This will help you maintain your balance. Ask your health care provider what types of exercise are appropriate for you. If you need a cane or walker, use it as recommended by your health care provider. Wear supportive shoes that have nonskid soles. Safety  Remove any tripping hazards, such as rugs, cords, and clutter. Install safety equipment such as grab bars in bathrooms and safety rails on stairs. Keep rooms and walkways  well-lit. General instructions Talk with your health care provider about your risks for falling. Tell your health care provider if: You fall. Be sure to tell your health care provider about all falls, even ones that seem minor. You feel dizzy, tiredness (fatigue), or off-balance. Take over-the-counter and prescription medicines only as told by your health care provider. These include  supplements. Eat a healthy diet and maintain a healthy weight. A healthy diet includes low-fat dairy products, low-fat (lean) meats, and fiber from whole grains, beans, and lots of fruits and vegetables. Stay current with your vaccines. Schedule regular health, dental, and eye exams. Summary Having a healthy lifestyle and getting preventive care can help to protect your health and wellness after age 11. Screening and testing are the best way to find a health problem early and help you avoid having a fall. Early diagnosis and treatment give you the best chance for managing medical conditions that are more common for people who are older than age 28. Falls are a major cause of broken bones and head injuries in people who are older than age 48. Take precautions to prevent a fall at home. Work with your health care provider to learn what changes you can make to improve your health and wellness and to prevent falls. This information is not intended to replace advice given to you by your health care provider. Make sure you discuss any questions you have with your health care provider. Document Revised: 03/20/2021 Document Reviewed: 03/20/2021 Elsevier Patient Education  2024 [employer].

## 2024-06-16 NOTE — Assessment & Plan Note (Signed)
 Active and affecting quality of life Wants to get back on trazodone  New prescription for trazodone  50 mg sent to pharmacy of record today.

## 2024-06-16 NOTE — Assessment & Plan Note (Signed)
 Continue atorvastatin 40 mg daily.

## 2024-07-06 DIAGNOSIS — H18591 Other hereditary corneal dystrophies, right eye: Secondary | ICD-10-CM | POA: Diagnosis not present

## 2024-07-06 DIAGNOSIS — H40023 Open angle with borderline findings, high risk, bilateral: Secondary | ICD-10-CM | POA: Diagnosis not present

## 2024-07-06 DIAGNOSIS — H52213 Irregular astigmatism, bilateral: Secondary | ICD-10-CM | POA: Diagnosis not present

## 2024-07-06 DIAGNOSIS — Z961 Presence of intraocular lens: Secondary | ICD-10-CM | POA: Diagnosis not present

## 2024-07-20 ENCOUNTER — Encounter: Payer: Self-pay | Admitting: Emergency Medicine

## 2024-07-20 NOTE — Telephone Encounter (Signed)
 What vaccine she referring to?

## 2024-07-21 DIAGNOSIS — Z23 Encounter for immunization: Secondary | ICD-10-CM | POA: Diagnosis not present

## 2024-07-21 NOTE — Telephone Encounter (Signed)
 Spoke with patient and informed her of the developing protocol for the COVID vaccine prescription. She understood

## 2024-08-06 ENCOUNTER — Telehealth: Admitting: Physician Assistant

## 2024-08-06 DIAGNOSIS — R300 Dysuria: Secondary | ICD-10-CM

## 2024-08-06 NOTE — Progress Notes (Signed)
  Because of your history of having a solitary kidney and based off your age, I feel your condition warrants further evaluation and I recommend that you be seen in a face-to-face visit to have a urine culture obtained. Urinary tract infections can progress rapidly and become complicated when we are older and with the history of having only one kidney we would want to make sure you are treated appropriately to avoid any kidney injury or progression to a kidney infection.   NOTE: There will be NO CHARGE for this E-Visit   If you are having a true medical emergency, please call 911.     For an urgent face to face visit, [facility] has multiple urgent care centers for your convenience.  Click the link below for the full list of locations and hours, walk-in wait times, appointment scheduling options and driving directions:  Urgent Care - [place], [place], [place], [place], [place], [place]       Your MyChart E-visit questionnaire answers were reviewed by a board certified advanced clinical practitioner to complete your personal care plan based on your specific symptoms.    Thank you for using e-Visits.      I have spent 5 minutes in review of e-visit questionnaire, review and updating patient chart, medical decision making and response to patient.   Delon CHRISTELLA Dickinson, PA-C

## 2024-08-07 ENCOUNTER — Other Ambulatory Visit: Payer: Self-pay

## 2024-08-07 ENCOUNTER — Telehealth: Payer: Self-pay

## 2024-08-07 ENCOUNTER — Ambulatory Visit
Admission: RE | Admit: 2024-08-07 | Discharge: 2024-08-07 | Disposition: A | Discharge status: Home / Self Care | Payer: Self-pay | Source: Ambulatory Visit

## 2024-08-07 VITALS — BP 149/77 | HR 62 | Temp 98.6°F | Resp 18

## 2024-08-07 DIAGNOSIS — Z87891 Personal history of nicotine dependence: Secondary | ICD-10-CM | POA: Diagnosis not present

## 2024-08-07 DIAGNOSIS — N3001 Acute cystitis with hematuria: Secondary | ICD-10-CM | POA: Insufficient documentation

## 2024-08-07 DIAGNOSIS — Z905 Acquired absence of kidney: Secondary | ICD-10-CM | POA: Insufficient documentation

## 2024-08-07 LAB — POCT URINE DIPSTICK
Bilirubin, UA: NEGATIVE
Glucose, UA: NEGATIVE mg/dL
Ketones, POC UA: NEGATIVE mg/dL
Nitrite, UA: NEGATIVE
POC PROTEIN,UA: NEGATIVE
Spec Grav, UA: 1.01 (ref 1.010–1.025)
Urobilinogen, UA: 0.2 U/dL
pH, UA: 6 (ref 5.0–8.0)

## 2024-08-07 MED ORDER — CEPHALEXIN 500 MG PO CAPS: 500.0000 mg | ORAL_CAPSULE | Freq: Two times a day (BID) | ORAL | 0 refills | Status: AC

## 2024-08-07 MED ORDER — CEPHALEXIN 500 MG PO CAPS: 500.0000 mg | ORAL_CAPSULE | Freq: Two times a day (BID) | ORAL | 0 refills | Status: DC

## 2024-08-07 NOTE — Telephone Encounter (Signed)
 TC from patient requesting for UC to re-send Kelfex.

## 2024-08-07 NOTE — Discharge Instructions (Addendum)
  1. Acute cystitis with hematuria (Primary) - POCT URINE DIPSTICK completed in UC shows small leukocytes, trace blood, no nitrite, these findings are possibly indicative of urinary tract infection. - Urine Culture collected and sent to lab for further testing results should be available in 2 to 3 days. - cephALEXin  (KEFLEX ) 500 MG capsule; Take 1 capsule (500 mg total) by mouth 2 (two) times daily for 7 days.  Dispense: 14 capsule; Refill: 0 -Continue to monitor symptoms for any change in severity if there is any escalation of current symptoms or development of new symptoms follow-up in ER for further evaluation and management.

## 2024-08-07 NOTE — ED Provider Notes (Signed)
 [facility]  Note:  This document was prepared using Conservation officer, historic buildings and may include unintentional dictation errors.  MRN: [id number] DOB: Sep 25, 1941  Subjective:   Tammy Ball is a 83 y.o. female presenting for dysuria and suprapubic abdominal pressure.  Patient reports mild temperature of 99 degrees yesterday.  Has taken tramadol  and Tylenol  for pain with minimal improvement.  Patient is concern for possible urinary tract/kidney infection.  Patient has only 1 kidney due to renal cell carcinoma to the left kidney.  Patient denies any significant increase in urinary output flank pain, abdominal pain.  No current facility-administered medications for this encounter.  Current Outpatient Medications:    acetaminophen  (TYLENOL ) 500 MG tablet, Take 500 mg by mouth every 6 (six) hours as needed for headache or moderate pain (pain)., Disp: , Rfl:    albuterol  (PROVENTIL ) (2.5 MG/3ML) 0.083% nebulizer solution, INHALE 3 ML BY NEBULIZATION EVERY 6 HOURS AS NEEDED FOR WHEEZING OR SHORTNESS OF BREATH, Disp: 75 mL, Rfl: 6   albuterol  (VENTOLIN  HFA) 108 (90 Base) MCG/ACT inhaler, Inhale 1-2 puffs into the lungs every 6 (six) hours as needed for wheezing or shortness of breath., Disp: 1 each, Rfl: 6   Ascorbic Acid  (VITAMIN C) 1000 MG tablet, Take 1,000 mg by mouth every morning., Disp: , Rfl:    aspirin  EC 81 MG tablet, Take 81 mg by mouth every morning., Disp: , Rfl:    atorvastatin  (LIPITOR) 40 MG tablet, TAKE 1 TABLET BY MOUTH EVERY DAY, Disp: 90 tablet, Rfl: 3   budesonide -glycopyrrolate -formoterol  (BREZTRI  AEROSPHERE) 160-9-4.8 MCG/ACT AERO inhaler, Inhale 2 puffs into the lungs in the morning and at bedtime., Disp: 3 each, Rfl: 4   Calcium  Carbonate Antacid (TUMS PO), Take 1 tablet by mouth 4 (four) times daily as needed (heartburn)., Disp: , Rfl:    Carboxymethylcellulose Sodium (ARTIFICIAL TEARS OP), Place 1 drop into both eyes daily as needed (dry eyes)., Disp: , Rfl:     carvedilol  (COREG ) 25 MG tablet, Take 1 tablet (25 mg total) by mouth 2 (two) times daily., Disp: 180 tablet, Rfl: 3   cephALEXin  (KEFLEX ) 500 MG capsule, Take 1 capsule (500 mg total) by mouth 2 (two) times daily for 7 days., Disp: 14 capsule, Rfl: 0   cholecalciferol  (VITAMIN D3) 25 MCG (1000 UNIT) tablet, Take 1,000 Units by mouth daily., Disp: , Rfl:    cycloSPORINE (RESTASIS) 0.05 % ophthalmic emulsion, , Disp: , Rfl:    diphenhydrAMINE  (BENADRYL ) 25 mg capsule, Take 25 mg by mouth at bedtime as needed for allergies., Disp: , Rfl:    Guaifenesin  (MUCINEX  MAXIMUM STRENGTH) 1200 MG TB12, Take 1,200 mg by mouth every morning., Disp: , Rfl:    loperamide  (IMODIUM ) 2 MG capsule, Take 1 capsule (2 mg total) by mouth 4 (four) times daily as needed for diarrhea or loose stools., Disp: 12 capsule, Rfl: 0   losartan  (COZAAR ) 100 MG tablet, TAKE 1 TABLET BY MOUTH EVERY DAY, Disp: 90 tablet, Rfl: 1   Omega-3 Fatty Acids  (FISH OIL) 1200 MG CAPS, Take 1,200 mg by mouth every morning., Disp: , Rfl:    ondansetron  (ZOFRAN -ODT) 4 MG disintegrating tablet, Take 1 tablet (4 mg total) by mouth every 8 (eight) hours as needed for nausea or vomiting., Disp: 12 tablet, Rfl: 1   OXYGEN , Inhale 2 L into the lungs as needed (to keep SATS at or above 90)., Disp: , Rfl:    sodium chloride  (OCEAN) 0.65 % SOLN nasal spray, Place 1 spray into both  nostrils at bedtime as needed for congestion., Disp: , Rfl:    traZODone  (DESYREL ) 50 MG tablet, Take 0.5-1 tablets (25-50 mg total) by mouth at bedtime as needed for sleep., Disp: 90 tablet, Rfl: 1   Allergies  Allergen Reactions   Bee Venom Anaphylaxis, Shortness Of Breath, Swelling and Other (See Comments)    Swelling at site (reaction to bees and wasps)   Amlodipine  Swelling and Other (See Comments)    Swelling of the ankles and hands    Levofloxacin Other (See Comments)    Joint pain    Alendronate  Other (See Comments)    Joint pains / hypercalcemia    Hctz  [Hydrochlorothiazide ] Palpitations and Other (See Comments)    Sweating     Past Medical History:  Diagnosis Date   Anemia    was while doing chemo   Anxiety    Arthritis    Asthma    Chemotherapy-induced neuropathy 11/01/2015   CHF (congestive heart failure) (HCC)    Claustrophobia    COPD (chronic obstructive pulmonary disease) (HCC)    Depression    Encounter for antineoplastic chemotherapy 02/09/2016   GERD (gastroesophageal reflux disease)    Headache    prior to menopause   Heart murmur    History of echocardiogram    Echo 2/17: EF 60-65%, grade 1 diastolic dysfunction, mild MR, trivial pericardial effusion   History of nuclear stress test    Myoview 2/17: no ischemia or scar, EF 79%; low risk   History of radiation therapy 10/30/16-11/06/16   Hyperlipidemia    Hypertension    Insomnia 05/16/2016   Myocardial infarction (HCC)    Non-small cell carcinoma of right lung, stage 2 (HCC) 10/03/2015   Oxygen  deficiency    Pneumonia    Radiation 02/29/16-04/10/16   50.4 Gy to right central chest   Renal cell carcinoma (HCC)    L nephrectomy  in 2012     Past Surgical History:  Procedure Laterality Date   BACK SURGERY     cervical 1991   BIOPSY  11/15/2022   Procedure: BIOPSY;  Surgeon: San Sandor GAILS, DO;  Location: [facility];  Service: Gastroenterology;;   COLONOSCOPY WITH PROPOFOL  N/A 11/15/2022   Procedure: COLONOSCOPY WITH PROPOFOL ;  Surgeon: San Sandor GAILS, DO;  Location: [facility];  Service: Gastroenterology;  Laterality: N/A;   ESOPHAGOGASTRODUODENOSCOPY (EGD) WITH PROPOFOL  N/A 11/15/2022   Procedure: ESOPHAGOGASTRODUODENOSCOPY (EGD) WITH PROPOFOL ;  Surgeon: San Sandor GAILS, DO;  Location: [facility];  Service: Gastroenterology;  Laterality: N/A;   EYE SURGERY     IR GENERIC HISTORICAL  01/16/2017   IR RADIOLOGIST EVAL & MGMT 01/16/2017 [facility]   IR GENERIC HISTORICAL  01/31/2017   IR FLUORO GUIDED NEEDLE PLC ASPIRATION/INJECTION LOC 01/31/2017  [name], MD [facility]   IR GENERIC HISTORICAL  01/31/2017   IR VERTEBROPLASTY CERV/THOR BX INC UNI/BIL INC/INJECT/IMAGING 01/31/2017 [name], MD [facility]   IR GENERIC HISTORICAL  01/31/2017   IR VERTEBROPLASTY EA ADDL (T&LS) BX INC UNI/BIL INC INJECT/IMAGING 01/31/2017 [name], MD [facility]   kidney cancer     LOBECTOMY Right    RUL   NEPHRECTOMY  2012   POLYPECTOMY  11/15/2022   Procedure: POLYPECTOMY;  Surgeon: San Sandor GAILS, DO;  Location: [facility];  Service: Gastroenterology;;   SPINE SURGERY     TUBAL LIGATION     VIDEO ASSISTED THORACOSCOPY (VATS)/WEDGE RESECTION Right 01/18/2016   Procedure: VIDEO ASSISTED THORACOSCOPY (VATS)/LUNG RESECTION, THOROCOTOMY, RIGHT UPPER LOBECTOMY, LYMPH NODE  DISSECTION, PLACEMENT OF ON Q;  Surgeon: [name], MD;  Location: [facility];  Service: Thoracic;  Laterality: Right;   VIDEO BRONCHOSCOPY Bilateral 09/20/2015   Procedure: VIDEO BRONCHOSCOPY WITHOUT FLUORO;  Surgeon: [name], MD;  Location: [facility];  Service: Cardiopulmonary;  Laterality: Bilateral;   VIDEO BRONCHOSCOPY N/A 01/18/2016   Procedure: VIDEO BRONCHOSCOPY;  Surgeon: [name], MD;  [facility];  Service: Thoracic;  Laterality: N/A;    Family History  Problem Relation Age of Onset   Cancer Mother        Bladder Cancer   Hypertension Mother    CAD Mother 85   Heart disease Sister    Breast cancer Sister    Birth defects Sister    Obesity Brother    Cancer Maternal Grandmother    Heart attack Daughter 38       s/p CABG   Glaucoma Daughter    Colon cancer Neg Hx    Colon polyps Neg Hx    Esophageal cancer Neg Hx    Rectal cancer Neg Hx    Stomach cancer Neg Hx     Social History   Tobacco Use   Smoking status: Former    Current packs/day: 0.00    Average packs/day: 1 pack/day for 50.0 years (50.0 ttl pk-yrs)    Types: Cigarettes    Start date: 03/15/1961    Quit date: 03/16/2011    Years since  quitting: 13.4   Smokeless tobacco: Never   Tobacco comments:    last 4-5 years of smoking, smoked 0.5 pack/day   Vaping Use   Vaping status: Never Used  Substance Use Topics   Alcohol  use: No    Alcohol /week: 0.0 standard drinks of alcohol    Drug use: No    ROS Refer to HPI for ROS details.  Objective:   Vitals: BP (!) 149/77 (BP Location: Left Arm)   Pulse 62   Temp 98.6 F (37 C) (Oral)   Resp 18   SpO2 93%   Physical Exam Vitals and nursing note reviewed.  Constitutional:      General: She is not in acute distress.    Appearance: Normal appearance. She is well-developed. She is not ill-appearing or toxic-appearing.  HENT:     Head: Normocephalic and atraumatic.  Cardiovascular:     Rate and Rhythm: Normal rate.  Pulmonary:     Effort: Pulmonary effort is normal. No respiratory distress.  Abdominal:     Palpations: Abdomen is soft.     Tenderness: There is no abdominal tenderness. There is no right CVA tenderness or left CVA tenderness.  Skin:    General: Skin is warm and dry.  Neurological:     General: No focal deficit present.     Mental Status: She is alert and oriented to person, place, and time.  Psychiatric:        Mood and Affect: Mood normal.        Behavior: Behavior normal.     Procedures  Results for orders placed or performed during the hospital encounter of 08/07/24 (from the past 24 hours)  POCT URINE DIPSTICK     Status: Abnormal   Collection Time: 08/07/24 11:35 AM  Result Value Ref Range   Color, UA light yellow (A) yellow   Clarity, UA cloudy (A) clear   Glucose, UA negative negative mg/dL   Bilirubin, UA negative negative   Ketones, POC UA negative negative mg/dL   Spec Grav, UA 8.989 8.989 -  1.025   Blood, UA trace-intact (A) negative   pH, UA 6.0 5.0 - 8.0   POC PROTEIN,UA negative negative, trace   Urobilinogen, UA 0.2 0.2 or 1.0 E.U./dL   Nitrite, UA Negative Negative   Leukocytes, UA Small (1+) (A) Negative    No  results found.   Assessment and Plan :     Discharge Instructions       1. Acute cystitis with hematuria (Primary) - POCT URINE DIPSTICK completed in UC shows small leukocytes, trace blood, no nitrite, these findings are possibly indicative of urinary tract infection. - Urine Culture collected and sent to lab for further testing results should be available in 2 to 3 days. - cephALEXin  (KEFLEX ) 500 MG capsule; Take 1 capsule (500 mg total) by mouth 2 (two) times daily for 7 days.  Dispense: 14 capsule; Refill: 0 -Continue to monitor symptoms for any change in severity if there is any escalation of current symptoms or development of new symptoms follow-up in ER for further evaluation and management.       Eden Toohey B Rhiannan Kievit   Treyden Hakim B, NP 08/07/24 1200

## 2024-08-07 NOTE — ED Triage Notes (Signed)
 Pt states it burns and stings when I pee and I have bladder pressure. Reports temp 99 yesterday. She took tramadol  and tylenol  yesterday for pain

## 2024-08-09 LAB — URINE CULTURE
Culture: >=100000 — AB
Special Requests: NORMAL

## 2024-08-10 ENCOUNTER — Ambulatory Visit (HOSPITAL_COMMUNITY): Payer: Self-pay

## 2024-08-18 DIAGNOSIS — H43813 Vitreous degeneration, bilateral: Secondary | ICD-10-CM | POA: Diagnosis not present

## 2024-08-18 DIAGNOSIS — H353132 Nonexudative age-related macular degeneration, bilateral, intermediate dry stage: Secondary | ICD-10-CM | POA: Diagnosis not present

## 2024-08-18 DIAGNOSIS — H43393 Other vitreous opacities, bilateral: Secondary | ICD-10-CM | POA: Diagnosis not present

## 2024-08-21 ENCOUNTER — Encounter: Payer: Self-pay | Admitting: Emergency Medicine

## 2024-08-21 NOTE — Telephone Encounter (Signed)
 Severe pains need to be seen and evaluated in the emergency room.  This could be a number of things some of them serious.

## 2024-08-21 NOTE — Telephone Encounter (Signed)
 Spoke with patient and was advised to go to the the ED patient understood and is going.

## 2024-08-22 ENCOUNTER — Emergency Department (HOSPITAL_COMMUNITY)

## 2024-08-22 ENCOUNTER — Emergency Department (HOSPITAL_COMMUNITY)
Admission: EM | Admit: 2024-08-22 | Discharge: 2024-08-22 | Disposition: A | Discharge status: Home / Self Care | Attending: Emergency Medicine | Admitting: Emergency Medicine

## 2024-08-22 ENCOUNTER — Other Ambulatory Visit: Payer: Self-pay

## 2024-08-22 DIAGNOSIS — I708 Atherosclerosis of other arteries: Secondary | ICD-10-CM | POA: Insufficient documentation

## 2024-08-22 DIAGNOSIS — I700 Atherosclerosis of aorta: Secondary | ICD-10-CM | POA: Diagnosis not present

## 2024-08-22 DIAGNOSIS — J449 Chronic obstructive pulmonary disease, unspecified: Secondary | ICD-10-CM | POA: Diagnosis not present

## 2024-08-22 DIAGNOSIS — M4854XA Collapsed vertebra, not elsewhere classified, thoracic region, initial encounter for fracture: Secondary | ICD-10-CM | POA: Diagnosis not present

## 2024-08-22 DIAGNOSIS — R109 Unspecified abdominal pain: Secondary | ICD-10-CM | POA: Diagnosis not present

## 2024-08-22 DIAGNOSIS — I110 Hypertensive heart disease with heart failure: Secondary | ICD-10-CM | POA: Insufficient documentation

## 2024-08-22 DIAGNOSIS — Z7982 Long term (current) use of aspirin: Secondary | ICD-10-CM | POA: Insufficient documentation

## 2024-08-22 DIAGNOSIS — M5134 Other intervertebral disc degeneration, thoracic region: Secondary | ICD-10-CM | POA: Diagnosis not present

## 2024-08-22 DIAGNOSIS — Z7951 Long term (current) use of inhaled steroids: Secondary | ICD-10-CM | POA: Diagnosis not present

## 2024-08-22 DIAGNOSIS — N281 Cyst of kidney, acquired: Secondary | ICD-10-CM | POA: Diagnosis not present

## 2024-08-22 DIAGNOSIS — J90 Pleural effusion, not elsewhere classified: Secondary | ICD-10-CM | POA: Diagnosis not present

## 2024-08-22 DIAGNOSIS — Z79899 Other long term (current) drug therapy: Secondary | ICD-10-CM | POA: Diagnosis not present

## 2024-08-22 DIAGNOSIS — M4856XA Collapsed vertebra, not elsewhere classified, lumbar region, initial encounter for fracture: Secondary | ICD-10-CM | POA: Diagnosis not present

## 2024-08-22 DIAGNOSIS — I517 Cardiomegaly: Secondary | ICD-10-CM | POA: Diagnosis not present

## 2024-08-22 DIAGNOSIS — J432 Centrilobular emphysema: Secondary | ICD-10-CM | POA: Diagnosis not present

## 2024-08-22 DIAGNOSIS — Z905 Acquired absence of kidney: Secondary | ICD-10-CM | POA: Diagnosis not present

## 2024-08-22 DIAGNOSIS — M546 Pain in thoracic spine: Secondary | ICD-10-CM | POA: Diagnosis present

## 2024-08-22 DIAGNOSIS — S22088A Other fracture of T11-T12 vertebra, initial encounter for closed fracture: Secondary | ICD-10-CM | POA: Insufficient documentation

## 2024-08-22 DIAGNOSIS — Z981 Arthrodesis status: Secondary | ICD-10-CM | POA: Diagnosis not present

## 2024-08-22 DIAGNOSIS — M4316 Spondylolisthesis, lumbar region: Secondary | ICD-10-CM | POA: Diagnosis not present

## 2024-08-22 DIAGNOSIS — S22080A Wedge compression fracture of T11-T12 vertebra, initial encounter for closed fracture: Secondary | ICD-10-CM | POA: Diagnosis not present

## 2024-08-22 DIAGNOSIS — I10 Essential (primary) hypertension: Secondary | ICD-10-CM | POA: Diagnosis not present

## 2024-08-22 DIAGNOSIS — I509 Heart failure, unspecified: Secondary | ICD-10-CM | POA: Diagnosis not present

## 2024-08-22 DIAGNOSIS — X58XXXA Exposure to other specified factors, initial encounter: Secondary | ICD-10-CM | POA: Insufficient documentation

## 2024-08-22 DIAGNOSIS — M549 Dorsalgia, unspecified: Secondary | ICD-10-CM | POA: Diagnosis not present

## 2024-08-22 DIAGNOSIS — R0602 Shortness of breath: Secondary | ICD-10-CM | POA: Diagnosis not present

## 2024-08-22 LAB — COMPREHENSIVE METABOLIC PANEL WITH GFR
ALT: 13 U/L (ref 0–44)
AST: 21 U/L (ref 15–41)
Albumin: 4.2 g/dL (ref 3.5–5.0)
Alkaline Phosphatase: 84 U/L (ref 38–126)
Anion gap: 11 (ref 5–15)
BUN: 16 mg/dL (ref 8–23)
CO2: 33 mmol/L — ABNORMAL HIGH (ref 22–32)
Calcium: 10.2 mg/dL (ref 8.9–10.3)
Chloride: 94 mmol/L — ABNORMAL LOW (ref 98–111)
Creatinine, Ser: 0.83 mg/dL (ref 0.44–1.00)
GFR, Estimated: >60 mL/min (ref >60)
Glucose, Bld: 79 mg/dL (ref 70–99)
Potassium: 4.2 mmol/L (ref 3.5–5.1)
Sodium: 137 mmol/L (ref 135–145)
Total Bilirubin: 0.9 mg/dL (ref 0.0–1.2)
Total Protein: 6.7 g/dL (ref 6.5–8.1)

## 2024-08-22 LAB — CBC
HCT: 39.2 % (ref 36.0–46.0)
Hemoglobin: 12.2 g/dL (ref 12.0–15.0)
MCH: 30.6 pg (ref 26.0–34.0)
MCHC: 31.1 g/dL (ref 30.0–36.0)
MCV: 98.2 fL (ref 80.0–100.0)
Platelets: 203 K/uL (ref 150–400)
RBC: 3.99 MIL/uL (ref 3.87–5.11)
RDW: 15.1 % (ref 11.5–15.5)
WBC: 8 K/uL (ref 4.0–10.5)
nRBC: 0 % (ref 0.0–0.2)

## 2024-08-22 LAB — LIPASE, BLOOD: Lipase: 21 U/L (ref 11–51)

## 2024-08-22 LAB — D-DIMER, QUANTITATIVE: D-Dimer, Quant: 1.19 ug{FEU}/mL — ABNORMAL HIGH (ref 0.00–0.50)

## 2024-08-22 MED ORDER — SODIUM CHLORIDE 0.9 % IV BOLUS
500.0000 mL | Freq: Once | INTRAVENOUS | Status: AC
  Administered 2024-08-22: 500 mL via INTRAVENOUS

## 2024-08-22 MED ORDER — HYDROMORPHONE HCL 1 MG/ML IJ SOLN
0.5000 mg | Freq: Once | INTRAMUSCULAR | Status: AC
  Administered 2024-08-22: 0.5 mg via INTRAVENOUS
  Filled 2024-08-22: qty 1

## 2024-08-22 MED ORDER — ONDANSETRON HCL 4 MG/2ML IJ SOLN
4.0000 mg | Freq: Once | INTRAMUSCULAR | Status: AC
  Administered 2024-08-22: 4 mg via INTRAVENOUS
  Filled 2024-08-22: qty 2

## 2024-08-22 MED ORDER — IOHEXOL 350 MG/ML SOLN
100.0000 mL | Freq: Once | INTRAVENOUS | Status: AC | PRN
Start: 2024-08-22 — End: 2024-08-22
  Administered 2024-08-22: 100 mL via INTRAVENOUS

## 2024-08-22 MED ORDER — ONDANSETRON 4 MG PO TBDP: ORAL_TABLET | ORAL | 0 refills | Status: DC

## 2024-08-22 MED ORDER — TRAMADOL HCL 50 MG PO TABS: 50.0000 mg | ORAL_TABLET | Freq: Four times a day (QID) | ORAL | 0 refills | Status: DC | PRN

## 2024-08-22 NOTE — ED Triage Notes (Signed)
 Pt reports back pain and an increase in her SHOB. Pt reports this started 1 week ago. Pt stating the pain started all of a sudden. Denies any long travel. Pt wears 2L [place] at baseline.

## 2024-08-22 NOTE — Discharge Instructions (Signed)
 Follow up with your family doctor as needed

## 2024-08-22 NOTE — ED Provider Notes (Signed)
 Between EMERGENCY DEPARTMENT AT [facility] Provider Note   CSN: [id number] Arrival date & time: 08/22/24  1352     Patient presents with: Back Pain   Tammy NOREN is a 83 y.o. female.  {Add pertinent medical, surgical, social history, OB history to YEP:[id number]} Patient complains of upper back pain.  Patient has a history of COPD, congestive heart failure, hypertension and an MI   Back Pain      Prior to Admission medications   Medication Sig Start Date End Date Taking? Authorizing Provider  ondansetron  (ZOFRAN -ODT) 4 MG disintegrating tablet 4mg  ODT q4 hours prn nausea/vomit 08/22/24  Yes Charlina Dwight, MD  traMADol  (ULTRAM ) 50 MG tablet Take 1 tablet (50 mg total) by mouth every 6 (six) hours as needed. 08/22/24  Yes Suzette Pac, MD  acetaminophen  (TYLENOL ) 500 MG tablet Take 500 mg by mouth every 6 (six) hours as needed for headache or moderate pain (pain).    [provider]  albuterol  (PROVENTIL ) (2.5 MG/3ML) 0.083% nebulizer solution INHALE 3 ML BY NEBULIZATION EVERY 6 HOURS AS NEEDED FOR WHEEZING OR SHORTNESS OF BREATH 04/30/24   Shelah Lamar GORMAN, MD  albuterol  (VENTOLIN  HFA) 108 (90 Base) MCG/ACT inhaler Inhale 1-2 puffs into the lungs every 6 (six) hours as needed for wheezing or shortness of breath. 04/29/24   Shelah Lamar GORMAN, MD  Ascorbic Acid  (VITAMIN C) 1000 MG tablet Take 1,000 mg by mouth every morning.    [provider]  aspirin  EC 81 MG tablet Take 81 mg by mouth every morning.    [provider]  atorvastatin  (LIPITOR) 40 MG tablet TAKE 1 TABLET BY MOUTH EVERY DAY 02/02/24   [name], Miguel Jose, MD  budesonide -glycopyrrolate -formoterol  (BREZTRI  AEROSPHERE) 160-9-4.8 MCG/ACT AERO inhaler Inhale 2 puffs into the lungs in the morning and at bedtime. 04/29/24   Shelah Lamar GORMAN, MD  Calcium  Carbonate Antacid (TUMS PO) Take 1 tablet by mouth 4 (four) times daily as needed (heartburn).    [provider]   Carboxymethylcellulose Sodium (ARTIFICIAL TEARS OP) Place 1 drop into both eyes daily as needed (dry eyes).    [provider]  carvedilol  (COREG ) 25 MG tablet Take 1 tablet (25 mg total) by mouth 2 (two) times daily. 04/15/24   Emelia [name], NP  cholecalciferol  (VITAMIN D3) 25 MCG (1000 UNIT) tablet Take 1,000 Units by mouth daily.    [provider]  cycloSPORINE (RESTASIS) 0.05 % ophthalmic emulsion  08/02/24   [provider]  diphenhydrAMINE  (BENADRYL ) 25 mg capsule Take 25 mg by mouth at bedtime as needed for allergies.    [provider]  Guaifenesin  (MUCINEX  MAXIMUM STRENGTH) 1200 MG TB12 Take 1,200 mg by mouth every morning.    [provider]  loperamide  (IMODIUM ) 2 MG capsule Take 1 capsule (2 mg total) by mouth 4 (four) times daily as needed for diarrhea or loose stools. 08/19/23   [name], [name], MD  losartan  (COZAAR ) 100 MG tablet TAKE 1 TABLET BY MOUTH EVERY DAY 02/17/24   Purcell [name], MD  Omega-3 Fatty Acids  (FISH OIL) 1200 MG CAPS Take 1,200 mg by mouth every morning.    [provider]  OXYGEN  Inhale 2 L into the lungs as needed (to keep SATS at or above 90).    [provider]  sodium chloride  (OCEAN) 0.65 % SOLN nasal spray Place 1 spray into both nostrils at bedtime as needed for congestion.    [provider]  traZODone  (DESYREL ) 50  MG tablet Take 0.5-1 tablets (25-50 mg total) by mouth at bedtime as needed for sleep. 06/16/24   Purcell [name], MD    Allergies: Bee venom, Amlodipine , Levofloxacin, Alendronate , and Hctz [hydrochlorothiazide ]    Review of Systems  Musculoskeletal:  Positive for back pain.    Updated Vital Signs BP (!) 179/89   Pulse 74   Temp 98.8 F (37.1 C) (Oral)   Resp (!) 22   SpO2 92%   Physical Exam  (all labs ordered are listed, but only abnormal results are displayed) Labs Reviewed  COMPREHENSIVE METABOLIC PANEL WITH GFR - Abnormal; Notable for the  following components:      Result Value   Chloride 94 (*)    CO2 33 (*)    All other components within normal limits  D-DIMER, QUANTITATIVE - Abnormal; Notable for the following components:   D-Dimer, Quant 1.19 (*)    All other components within normal limits  LIPASE, BLOOD  CBC    EKG: None  Radiology: CT L-SPINE NO CHARGE Result Date: 08/22/2024 CLINICAL DATA:  Back pain EXAM: CT LUMBAR SPINE WITHOUT CONTRAST TECHNIQUE: Multidetector CT imaging of the lumbar spine was performed without intravenous contrast administration. Multiplanar CT image reconstructions were also generated. RADIATION DOSE REDUCTION: This exam was performed according to the departmental dose-optimization program which includes automated exposure control, adjustment of the mA and/or kV according to patient size and/or use of iterative reconstruction technique. COMPARISON:  CT abdomen and pelvis 08/19/2023 FINDINGS: Segmentation: 5 lumbar type vertebrae. Alignment: Slight degenerative anterolisthesis of L3 on L4 and L4 on L5, stable. Vertebrae: No acute fracture or focal pathologic process. Paraspinal and other soft tissues: Negative. Disc levels: Disc spaces maintained. Moderate degenerative facet disease, most pronounced in the lower lumbar spine. IMPRESSION: No acute bony abnormality. Electronically Signed   By: Franky Crease M.D.   On: 08/22/2024 19:05   CT T-SPINE NO CHARGE Result Date: 08/22/2024 CLINICAL DATA:  Back pain EXAM: CT THORACIC SPINE WITHOUT CONTRAST TECHNIQUE: Multidetector CT images of the thoracic were obtained using the standard protocol without intravenous contrast. RADIATION DOSE REDUCTION: This exam was performed according to the departmental dose-optimization program which includes automated exposure control, adjustment of the mA and/or kV according to patient size and/or use of iterative reconstruction technique. COMPARISON:  Rib CT chest 03/16/2024 FINDINGS: Alignment: No subluxation. Vertebrae:  Chronic compression fractures status post vertebroplasty noted at T3 and T4. Mild compression fracture at T6, stable since prior study. Mild compression fracture at T7, new since prior study. Paraspinal and other soft tissues: Negative Disc levels: Degenerative disc disease diffusely, most pronounced in the upper thoracic spine. IMPRESSION: New mild compression fracture at T7. Chronic compression fractures status post vertebroplasty at T3 and T4. Stable mild chronic compression fracture at T6. Electronically Signed   By: Franky Crease M.D.   On: 08/22/2024 19:03   CT ABDOMEN PELVIS W CONTRAST Result Date: 08/22/2024 CLINICAL DATA:  Abdominal pain, back pain, shortness of breath EXAM: CT ABDOMEN AND PELVIS WITH CONTRAST TECHNIQUE: Multidetector CT imaging of the abdomen and pelvis was performed using the standard protocol following bolus administration of intravenous contrast. RADIATION DOSE REDUCTION: This exam was performed according to the departmental dose-optimization program which includes automated exposure control, adjustment of the mA and/or kV according to patient size and/or use of iterative reconstruction technique. CONTRAST:  OMNIPAQUE  IOHEXOL  350 MG/ML SOLN COMPARISON:  None Available. FINDINGS: Lower chest: See chest CT report. Hepatobiliary: No focal hepatic abnormality. Gallbladder unremarkable. Pancreas: No  focal abnormality or ductal dilatation. Spleen: No focal abnormality.  Normal size. Adrenals/Urinary Tract: Adrenal glands normal. Prior left nephrectomy. Scattered cysts within the right kidney are unchanged. No follow-up imaging recommended. No hydronephrosis. Urinary bladder unremarkable. Stomach/Bowel: Stomach, large and small bowel grossly unremarkable. No obstruction or inflammatory process. Vascular/Lymphatic: Diffuse aortoiliac atherosclerosis. No evidence of aneurysm or adenopathy. Reproductive: Uterus and adnexa unremarkable.  No mass. Other: No free fluid or free air.  Musculoskeletal: No acute bony abnormality. IMPRESSION: No acute findings in the abdomen or pelvis. Diffuse aortoiliac atherosclerosis. Electronically Signed   By: Franky Crease M.D.   On: 08/22/2024 19:01   CT Angio Chest PE W and/or Wo Contrast Result Date: 08/22/2024 CLINICAL DATA:  Pulmonary embolism (PE) suspected, high prob. Shortness of breath, back pain. EXAM: CT ANGIOGRAPHY CHEST WITH CONTRAST TECHNIQUE: Multidetector CT imaging of the chest was performed using the standard protocol during bolus administration of intravenous contrast. Multiplanar CT image reconstructions and MIPs were obtained to evaluate the vascular anatomy. RADIATION DOSE REDUCTION: This exam was performed according to the departmental dose-optimization program which includes automated exposure control, adjustment of the mA and/or kV according to patient size and/or use of iterative reconstruction technique. CONTRAST:  OMNIPAQUE  IOHEXOL  350 MG/ML SOLN COMPARISON:  03/16/2024 FINDINGS: Cardiovascular: Cardiomegaly. Scattered coronary artery and aortic atherosclerosis. No evidence of aortic aneurysm. No filling defects in the pulmonary arteries to suggest pulmonary emboli. Prominent central pulmonary arteries suggest pulmonary arterial hypertension. This is stable since prior study. Mediastinum/Nodes: No mediastinal, hilar, or axillary adenopathy. Trachea and esophagus are unremarkable. Thyroid  unremarkable. Lungs/Pleura: Trace bilateral pleural effusions. Mild centrilobular emphysema. Left base atelectasis or scarring, similar to prior study. No confluent airspace opacities otherwise. Upper Abdomen: No acute findings. Musculoskeletal: Chest wall soft tissues are unremarkable. Prior compression fractures and vertebroplasty changes in the upper thoracic spine, unchanged. Mild compression fracture at T6 is stable. Mild compression fracture at T7 is new since prior study. Review of the MIP images confirms the above findings.  IMPRESSION: No evidence of pulmonary embolus. Cardiomegaly, coronary artery disease. Trace bilateral pleural effusions. New mild T7 compression fracture. Aortic Atherosclerosis (ICD10-I70.0) and Emphysema (ICD10-J43.9). Electronically Signed   By: Franky Crease M.D.   On: 08/22/2024 18:57   DG Chest Port 1 View Result Date: 08/22/2024 CLINICAL DATA:  Shortness of breath and back pain. EXAM: PORTABLE CHEST 1 VIEW COMPARISON:  03/16/2024, 05/10/2022. FINDINGS: The heart is enlarged and the mediastinal contour is within normal limits. There is atherosclerotic calcification of the aorta. Interstitial prominence and airspace disease is noted at the lung bases, unchanged from multiple prior exams and likely representing scarring. No definite effusion or pneumothorax is seen. Cervical spinal fusion hardware is present. No acute osseous abnormality. IMPRESSION: Stable chest with no active disease. Electronically Signed   By: Leita Birmingham M.D.   On: 08/22/2024 14:59    {Document cardiac monitor, telemetry assessment procedure when appropriate:32947} Procedures   Medications Ordered in the ED  ondansetron  (ZOFRAN ) injection 4 mg (has no administration in time range)  sodium chloride  0.9 % bolus 500 mL (0 mLs Intravenous Stopped 08/22/24 1752)  HYDROmorphone  (DILAUDID ) injection 0.5 mg (0.5 mg Intravenous Given 08/22/24 1555)  iohexol  (OMNIPAQUE ) 350 MG/ML injection 100 mL (100 mLs Intravenous Contrast Given 08/22/24 1820)  HYDROmorphone  (DILAUDID ) injection 0.5 mg (0.5 mg Intravenous Given 08/22/24 1850)      {Click here for ABCD2, HEART and other calculators REFRESH Note before signing:1}  Medical Decision Making Amount and/or Complexity of Data Reviewed Labs: ordered. Radiology: ordered.  Risk Prescription drug management.   Patient with a T12 compression fracture.  Patient is placed on Ultram  and will follow-up with her PCP  {Document critical care time when  appropriate  Document review of labs and clinical decision tools ie [id number], etc  Document your independent review of radiology images and any outside records  Document your discussion with family members, caretakers and with consultants  Document social determinants of health affecting pt's care  Document your decision making why or why not admission, treatments were needed:32947:::1}   Final diagnoses:  None    ED Discharge Orders          Ordered    traMADol  (ULTRAM ) 50 MG tablet  Every 6 hours PRN        08/22/24 2019    ondansetron  (ZOFRAN -ODT) 4 MG disintegrating tablet        08/22/24 2019

## 2024-08-24 ENCOUNTER — Telehealth: Admitting: Physician Assistant

## 2024-08-24 ENCOUNTER — Ambulatory Visit: Payer: Self-pay

## 2024-08-24 DIAGNOSIS — S22060S Wedge compression fracture of T7-T8 vertebra, sequela: Secondary | ICD-10-CM | POA: Diagnosis not present

## 2024-08-24 NOTE — Patient Instructions (Signed)
 Tammy Ball Smoke, thank you for joining Tammy Velma Lunger, PA-C for today's virtual visit.  While this provider is not your primary care provider (PCP), if your PCP is located in our provider database this encounter information will be shared with them immediately following your visit.   A [facility] MyChart account gives you access to today's visit and all your visits, tests, and labs performed at [facility]  click here if you don't have a [facility] MyChart account or go to [email or web address]  Consent: (Patient) Tammy Ball provided verbal consent for this virtual visit at the beginning of the encounter.  Current Medications:  Current Outpatient Medications:    acetaminophen  (TYLENOL ) 500 MG tablet, Take 500 mg by mouth every 6 (six) hours as needed for headache or moderate pain (pain)., Disp: , Rfl:    albuterol  (PROVENTIL ) (2.5 MG/3ML) 0.083% nebulizer solution, INHALE 3 ML BY NEBULIZATION EVERY 6 HOURS AS NEEDED FOR WHEEZING OR SHORTNESS OF BREATH, Disp: 75 mL, Rfl: 6   albuterol  (VENTOLIN  HFA) 108 (90 Base) MCG/ACT inhaler, Inhale 1-2 puffs into the lungs every 6 (six) hours as needed for wheezing or shortness of breath., Disp: 1 each, Rfl: 6   Ascorbic Acid  (VITAMIN C) 1000 MG tablet, Take 1,000 mg by mouth every morning., Disp: , Rfl:    aspirin  EC 81 MG tablet, Take 81 mg by mouth every morning., Disp: , Rfl:    atorvastatin  (LIPITOR) 40 MG tablet, TAKE 1 TABLET BY MOUTH EVERY DAY, Disp: 90 tablet, Rfl: 3   budesonide -glycopyrrolate -formoterol  (BREZTRI  AEROSPHERE) 160-9-4.8 MCG/ACT AERO inhaler, Inhale 2 puffs into the lungs in the morning and at bedtime., Disp: 3 each, Rfl: 4   Calcium  Carbonate Antacid (TUMS PO), Take 1 tablet by mouth 4 (four) times daily as needed (heartburn)., Disp: , Rfl:    Carboxymethylcellulose Sodium (ARTIFICIAL TEARS OP), Place 1 drop into both eyes daily as needed (dry eyes)., Disp: , Rfl:    carvedilol  (COREG ) 25 MG tablet, Take 1  tablet (25 mg total) by mouth 2 (two) times daily., Disp: 180 tablet, Rfl: 3   cholecalciferol  (VITAMIN D3) 25 MCG (1000 UNIT) tablet, Take 1,000 Units by mouth daily., Disp: , Rfl:    cycloSPORINE (RESTASIS) 0.05 % ophthalmic emulsion, , Disp: , Rfl:    diphenhydrAMINE  (BENADRYL ) 25 mg capsule, Take 25 mg by mouth at bedtime as needed for allergies., Disp: , Rfl:    Guaifenesin  (MUCINEX  MAXIMUM STRENGTH) 1200 MG TB12, Take 1,200 mg by mouth every morning., Disp: , Rfl:    loperamide  (IMODIUM ) 2 MG capsule, Take 1 capsule (2 mg total) by mouth 4 (four) times daily as needed for diarrhea or loose stools., Disp: 12 capsule, Rfl: 0   losartan  (COZAAR ) 100 MG tablet, TAKE 1 TABLET BY MOUTH EVERY DAY, Disp: 90 tablet, Rfl: 1   Omega-3 Fatty Acids  (FISH OIL) 1200 MG CAPS, Take 1,200 mg by mouth every morning., Disp: , Rfl:    ondansetron  (ZOFRAN -ODT) 4 MG disintegrating tablet, 4mg  ODT q4 hours prn nausea/vomit, Disp: 12 tablet, Rfl: 0   OXYGEN , Inhale 2 L into the lungs as needed (to keep SATS at or above 90)., Disp: , Rfl:    sodium chloride  (OCEAN) 0.65 % SOLN nasal spray, Place 1 spray into both nostrils at bedtime as needed for congestion., Disp: , Rfl:    traMADol  (ULTRAM ) 50 MG tablet, Take 1 tablet (50 mg total) by mouth every 6 (six) hours as needed., Disp: 20 tablet, Rfl: 0   traZODone  (  DESYREL ) 50 MG tablet, Take 0.5-1 tablets (25-50 mg total) by mouth at bedtime as needed for sleep., Disp: 90 tablet, Rfl: 1   Medications ordered in this encounter:  No orders of the defined types were placed in this encounter.    *If you need refills on other medications prior to your next appointment, please contact your pharmacy*  Follow-Up: Call back or seek an in-person evaluation if the symptoms worsen or if the condition fails to improve as anticipated.  [facility] [phone]  Other Instructions You can increase Tramadol  to 2 tablets for the next dose only.  Then continue 50 mg  (1 tablet) every 4-6 hours overnight until your follow-up with your PCP tomorrow. Dose the Tylenol  between your doses of Tramadol . If you note any non-resolving, new, or worsening symptoms despite treatment, please seek an in-person evaluation ASAP.    If you have been instructed to have an in-person evaluation today at a local Urgent Care facility, please use the link below. It will take you to a list of all of our available [facility], including address, phone number and hours of operation. Please do not delay care.  [facility]  If you or a family member do not have a primary care provider, use the link below to schedule a visit and establish care. When you choose a [facility] primary care physician or advanced practice provider, you gain a long-term partner in health. Find a Primary Care Provider  Learn more about [facility]'s in-office and virtual care options: [facility] - Get Care Now

## 2024-08-24 NOTE — Progress Notes (Signed)
 Virtual Visit Consent   Tammy Ball, you are scheduled for a virtual visit with a [facility] provider today. Just as with appointments in the office, your consent must be obtained to participate. Your consent will be active for this visit and any virtual visit you may have with one of our providers in the next 365 days. If you have a MyChart account, a copy of this consent can be sent to you electronically.  As this is a virtual visit, video technology does not allow for your provider to perform a traditional examination. This may limit your provider's ability to fully assess your condition. If your provider identifies any concerns that need to be evaluated in person or the need to arrange testing (such as labs, EKG, etc.), we will make arrangements to do so. Although advances in technology are sophisticated, we cannot ensure that it will always work on either your end or our end. If the connection with a video visit is poor, the visit may have to be switched to a telephone visit. With either a video or telephone visit, we are not always able to ensure that we have a secure connection.  By engaging in this virtual visit, you consent to the provision of healthcare and authorize for your insurance to be billed (if applicable) for the services provided during this visit. Depending on your insurance coverage, you may receive a charge related to this service.  I need to obtain your verbal consent now. Are you willing to proceed with your visit today? PATINA SPANIER has provided verbal consent on 08/24/2024 for a virtual visit (video or telephone). Tammy Ball, [place]  Date: 08/24/2024 4:05 PM   Virtual Visit via Video Note   I, Tammy Ball, connected with  CATINA NUSS  ([id number], 08-19-1941) on 08/24/24 at  3:45 PM EDT by a video-enabled telemedicine application and verified that I am speaking with the correct person using two identifiers.  Location: Patient: Virtual Visit  Location Patient: Home Provider: Virtual Visit Location Provider: Home Office   I discussed the limitations of evaluation and management by telemedicine and the availability of in person appointments. The patient expressed understanding and agreed to proceed.    History of Present Illness: Tammy Ball is a 83 y.o. who identifies as a female who was assigned female at birth, and is being seen today for questions regarding her Tramadol  dose. Was evaluated on 10/11 at ER for substantial back pain. CT revealed a chronic compression fracture at T6 and new compression fracture at T7. Was offered Hydrocodone  but due to certain medications making her more nauseated, she opted for Tramadol  that she had taken before, instead. Notes taking as directed, 50 mg every 6 hours as needed, along with OTC Tylenol  but is not getting substantial relief of pain. States her pain is still about 9-10/10. Last dose of Tramadol  was at 12:00 today. Same with her OTC Tylenol . Has ER follow-up tomorrow with her PCP but is wondering if ok to take her Tramadol  differently for better pain relief.    HPI: HPI  Problems:  Patient Active Problem List   Diagnosis Date Noted   Class 1 obesity due to excess calories with serious comorbidity and body mass index (BMI) of 30.0 to 30.9 in adult 03/20/2023   Iron deficiency anemia 11/15/2022   Adenomatous polyp of ascending colon 11/15/2022   Adenomatous polyp of transverse colon 11/15/2022   Adenomatous polyp of descending colon 11/15/2022   Internal hemorrhoids 11/15/2022  History of myocardial infarction 09/20/2022   Gastroesophageal reflux disease without esophagitis 06/19/2022   Epigastric discomfort 06/09/2022   Chronic bilateral low back pain without sciatica 09/06/2021   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) 02/21/2021   Recurrent major depressive disorder, in full remission 02/21/2021   Atherosclerosis of aorta 02/21/2021    [place]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) 02/21/2021   Chronic diastolic CHF (congestive heart failure) (HCC)    Stage 3a chronic kidney disease (HCC) 11/10/2020   Chronic respiratory failure with hypoxia (HCC) 12/26/2018   Age-related osteoporosis with current pathological fracture 11/21/2018   Renal insufficiency 05/20/2018   History of compression fracture of spine 12/10/2016   Insomnia 05/16/2016   Pneumonitis, radiation 04/11/2016   S/P lobectomy of lung 01/18/2016   Chemotherapy-induced neuropathy 11/01/2015   Non-small cell carcinoma of right lung, stage 2 (HCC) 10/03/2015   Hyperlipidemia 08/14/2013   Spondylolisthesis of lumbar region 07/03/2013   Essential hypertension 12/17/2011   COPD GOLD B-C 12/17/2011   Renal cell cancer (HCC) 12/17/2011   Depression 12/17/2011    Allergies:  Allergies  Allergen Reactions   Bee Venom Anaphylaxis, Shortness Of Breath, Swelling and Other (See Comments)    Swelling at site (reaction to bees and wasps)   Amlodipine  Swelling and Other (See Comments)    Swelling of the ankles and hands    Levofloxacin Other (See Comments)    Joint pain    Alendronate  Other (See Comments)    Joint pains / hypercalcemia    Hctz [Hydrochlorothiazide ] Palpitations and Other (See Comments)    Sweating    Medications:  Current Outpatient Medications:    acetaminophen  (TYLENOL ) 500 MG tablet, Take 500 mg by mouth every 6 (six) hours as needed for headache or moderate pain (pain)., Disp: , Rfl:    albuterol  (PROVENTIL ) (2.5 MG/3ML) 0.083% nebulizer solution, INHALE 3 ML BY NEBULIZATION EVERY 6 HOURS AS NEEDED FOR WHEEZING OR SHORTNESS OF BREATH, Disp: 75 mL, Rfl: 6   albuterol  (VENTOLIN  HFA) 108 (90 Base) MCG/ACT inhaler, Inhale 1-2 puffs into the lungs every 6 (six) hours as needed for wheezing or shortness of breath., Disp: 1 each, Rfl: 6   Ascorbic Acid  (VITAMIN C) 1000 MG tablet, Take 1,000 mg by mouth every morning., Disp: , Rfl:    aspirin  EC 81 MG tablet, Take 81 mg by  mouth every morning., Disp: , Rfl:    atorvastatin  (LIPITOR) 40 MG tablet, TAKE 1 TABLET BY MOUTH EVERY DAY, Disp: 90 tablet, Rfl: 3   budesonide -glycopyrrolate -formoterol  (BREZTRI  AEROSPHERE) 160-9-4.8 MCG/ACT AERO inhaler, Inhale 2 puffs into the lungs in the morning and at bedtime., Disp: 3 each, Rfl: 4   Calcium  Carbonate Antacid (TUMS PO), Take 1 tablet by mouth 4 (four) times daily as needed (heartburn)., Disp: , Rfl:    Carboxymethylcellulose Sodium (ARTIFICIAL TEARS OP), Place 1 drop into both eyes daily as needed (dry eyes)., Disp: , Rfl:    carvedilol  (COREG ) 25 MG tablet, Take 1 tablet (25 mg total) by mouth 2 (two) times daily., Disp: 180 tablet, Rfl: 3   cholecalciferol  (VITAMIN D3) 25 MCG (1000 UNIT) tablet, Take 1,000 Units by mouth daily., Disp: , Rfl:    cycloSPORINE (RESTASIS) 0.05 % ophthalmic emulsion, , Disp: , Rfl:    diphenhydrAMINE  (BENADRYL ) 25 mg capsule, Take 25 mg by mouth at bedtime as needed for allergies., Disp: , Rfl:    Guaifenesin  (MUCINEX  MAXIMUM STRENGTH) 1200 MG TB12, Take 1,200 mg by mouth every morning., Disp: , Rfl:  loperamide  (IMODIUM ) 2 MG capsule, Take 1 capsule (2 mg total) by mouth 4 (four) times daily as needed for diarrhea or loose stools., Disp: 12 capsule, Rfl: 0   losartan  (COZAAR ) 100 MG tablet, TAKE 1 TABLET BY MOUTH EVERY DAY, Disp: 90 tablet, Rfl: 1   Omega-3 Fatty Acids  (FISH OIL) 1200 MG CAPS, Take 1,200 mg by mouth every morning., Disp: , Rfl:    ondansetron  (ZOFRAN -ODT) 4 MG disintegrating tablet, 4mg  ODT q4 hours prn nausea/vomit, Disp: 12 tablet, Rfl: 0   OXYGEN , Inhale 2 L into the lungs as needed (to keep SATS at or above 90)., Disp: , Rfl:    sodium chloride  (OCEAN) 0.65 % SOLN nasal spray, Place 1 spray into both nostrils at bedtime as needed for congestion., Disp: , Rfl:    traMADol  (ULTRAM ) 50 MG tablet, Take 1 tablet (50 mg total) by mouth every 6 (six) hours as needed., Disp: 20 tablet, Rfl: 0   traZODone  (DESYREL ) 50 MG tablet,  Take 0.5-1 tablets (25-50 mg total) by mouth at bedtime as needed for sleep., Disp: 90 tablet, Rfl: 1  Observations/Objective: Patient is well-developed, well-nourished in no acute distress.  Resting comfortably  at home.  Head is normocephalic, atraumatic.  No labored breathing.  Speech is clear and coherent with logical content.  Patient is alert and oriented at baseline.   Assessment and Plan: 1. Compression fracture of T7 vertebra, sequela (Primary)  Discussed we cannot provide Rx for controlled medications. Reviewed recent labs -- renal and hepatic function. Will have her increase her next dose of Tramadol  to 100 mg, before resuming her 50 mg dosing every 4-6 hours until her follow-up appointment with PCP tomorrow morning. Also recommended her OTC tylenol  be dosed between doses of the Tramadol . ER for any worsening pain overnight.  Follow Up Instructions: I discussed the assessment and treatment plan with the patient. The patient was provided an opportunity to ask questions and all were answered. The patient agreed with the plan and demonstrated an understanding of the instructions.  A copy of instructions were sent to the patient via MyChart unless otherwise noted below.   The patient was advised to call back or seek an in-person evaluation if the symptoms worsen or if the condition fails to improve as anticipated.    Tammy Velma Lunger, PA-C

## 2024-08-24 NOTE — Telephone Encounter (Signed)
 FYI Only or Action Required?: Action required by provider: referral request.  Patient was last seen in primary care on 06/16/2024 by Purcell [name], MD.  Called Nurse Triage reporting Back Pain.  Symptoms began a week ago.  Interventions attempted: OTC medications: Tylenol , Prescription medications: Tramadol , and Rest, hydration, or home remedies.  Symptoms are: gradually worsening.  Triage Disposition: See HCP Within 4 Hours (Or PCP Triage)  Patient/caregiver understands and will follow disposition?: Yes Seen in ED yesterday, dx with compress fracture. Pt reports 10/10 pain, unable to move today, says tramadol  doesn't help much.  Virtual urgent care appt scheduled for today to address pain level. HFU scheduled with PCP tomorrow to establish treatment plan/referrals for fractures.   Copied from CRM [id number]. Topic: Clinical - Red Word Triage >> Aug 24, 2024  1:37 PM Macario HERO wrote: Red Word that prompted transfer to Nurse Triage: Patient went to ER yesterday for back pain and found out she had two fractured vertebrae. They were going to give her a prescription for pain medication but she refused and now she is in severe pain and unable to stand. Reason for Disposition  [1] SEVERE back pain (e.g., excruciating, unable to do any normal activities) AND [2] not improved 2 hours after pain medicine  Answer Assessment - Initial Assessment Questions 1. ONSET: When did the pain begin? (e.g., minutes, hours, days)     Approx 1 week  2. LOCATION: Where does it hurt? (upper, mid or lower back)     Mid to lower back   3. SEVERITY: How bad is the pain?  (e.g., Scale 1-10; mild, moderate, or severe)     Moderate to severe  4. PATTERN: Is the pain constant? (e.g., yes, no; constant, intermittent)      *No Answer* 5. RADIATION: Does the pain shoot into your legs or somewhere else?     No  6. CAUSE:  What do you think is causing the back pain?      Has fractured vertebrae in   thoracic area  7. BACK OVERUSE:  Any recent lifting of heavy objects, strenuous work or exercise?     No  8. MEDICINES: What have you taken so far for the pain? (e.g., nothing, acetaminophen , NSAIDS)     Tramadol   9. NEUROLOGIC SYMPTOMS: Do you have any weakness, numbness, or problems with bowel/bladder control?     *No Answer* 10. OTHER SYMPTOMS: Do you have any other symptoms? (e.g., fever, abdomen pain, burning with urination, blood in urine)       Nausea, constipation  11. PREGNANCY: Is there any chance you are pregnant? When was your last menstrual period?       no  Protocols used: Back Pain-A-AH

## 2024-08-25 ENCOUNTER — Encounter: Payer: Self-pay | Admitting: Emergency Medicine

## 2024-08-25 ENCOUNTER — Ambulatory Visit: Admitting: Emergency Medicine

## 2024-08-25 ENCOUNTER — Inpatient Hospital Stay: Admitting: Emergency Medicine

## 2024-08-25 VITALS — BP 150/90 | HR 67 | Temp 98.7°F | Ht 63.0 in

## 2024-08-25 DIAGNOSIS — N1831 Chronic kidney disease, stage 3a: Secondary | ICD-10-CM | POA: Diagnosis not present

## 2024-08-25 DIAGNOSIS — I10 Essential (primary) hypertension: Secondary | ICD-10-CM | POA: Diagnosis not present

## 2024-08-25 DIAGNOSIS — J9611 Chronic respiratory failure with hypoxia: Secondary | ICD-10-CM | POA: Diagnosis not present

## 2024-08-25 DIAGNOSIS — S22000A Wedge compression fracture of unspecified thoracic vertebra, initial encounter for closed fracture: Secondary | ICD-10-CM

## 2024-08-25 DIAGNOSIS — Z09 Encounter for follow-up examination after completed treatment for conditions other than malignant neoplasm: Secondary | ICD-10-CM

## 2024-08-25 MED ORDER — HYDROCODONE-ACETAMINOPHEN 5-325 MG PO TABS: 1.0000 | ORAL_TABLET | Freq: Four times a day (QID) | ORAL | 0 refills | Status: DC | PRN

## 2024-08-25 NOTE — Assessment & Plan Note (Signed)
 Recent CT scan of spine reviewed with patient Report reviewed Pain management discussed Recommend Tylenol  for mild to moderate pain and Norco for moderate to severe pain Needs evaluation by spine surgeon Referral placed today

## 2024-08-25 NOTE — Telephone Encounter (Signed)
 Pt scheduled for appt today.

## 2024-08-25 NOTE — Patient Instructions (Signed)
 Broken Bones of the Spine (Spine Compression Fracture): What to Know  A spine compression fracture happens when one or more of the bones of the spine breaks (fractures) and collapses from trauma or weak bones. With this type of fracture, the spine is pushed into a wedge shape. It often happens in the middle or lower part of your spine. What are the causes? This type of fracture may be caused by: Osteoporosis. This condition causes bones to thin, lose density, and grow weak. It's the most common cause. A fall. A car or motorcycle accident. Cancer. Trauma, such as a heavy, direct hit to your head or back. What increases the risk? You're more likely to get this kind of fracture if: You're 55 years of age or older. You have osteoporosis. You have certain types of cancer. These include: Multiple myeloma. Lymphoma. Prostate cancer. Lung cancer. Breast cancer. You're female. You smoke or drink alcohol. What are the signs or symptoms? Very bad pain when you move, such as when you cough or sneeze. Pain that gets worse over time. Pain that's worse when you stand, walk, sit, or bend. Sudden pain that's so bad that it's hard for you to move. Bending or humping of the spine. Slow loss of height. Numbness, tingling, or weakness in your back and legs. Trouble walking. How is this diagnosed? You may be diagnosed based on your symptoms, medical history, and an exam. During the exam, your health care provider may tap along the length of your spine to check for tender spots.  Tests may also be done. These may include. A bone mineral density test. Imaging tests, such as: An X-ray of your spine. A CT scan. An MRI. How is this treated? Some fractures may heal on their own. If you need treatment, it may include: Pain medicine. Rest. A back brace. Physical therapy. Medicine to make your bones stronger. Calcium and vitamin D  supplements. In some cases, you may need surgery, such as if: Your  back becomes misshapen. You have nerve pain or weakness. Your pain doesn't get better with other treatment. The types of surgeries that may be done include: Vertebroplasty. This is when bone cement is put into the collapsed bones to help make them more stable. Balloon kyphoplasty. This is when the collapsed bones are expanded with a balloon. Then, bone cement is put into them. Spinal fusion. This is when the collapsed bones are connected (fused) to healthy bones in the spine. Follow these instructions at home: Medicines Take your medicines only as told. You may need to take steps to help treat or prevent trouble pooping (constipation), such as: Taking medicines to help you poop. Eating foods high in fiber, like beans, whole grains, and fresh fruits and vegetables. Drinking more fluids as told. Ask your provider if it's safe to drive or use machines while taking your medicine. If you have a brace: Wear the brace as told. Take it off only if your provider says you can. Keep the brace clean. Managing pain, stiffness, and swelling  Use ice or an ice pack as told. If you have a brace that you can take off, remove it only as told. Place a towel between your skin and the ice. Leave the ice on for 20 minutes, 2-3 times a day. If your skin turns red, take off the ice right away to prevent skin damage. The risk of damage is higher if you can't feel pain, heat, or cold. Activity Rest as told. Get up to take short  walks many times during the day. This helps you breathe better and keeps your blood flowing. Ask for help if you feel weak or unsteady. Exercise as told. You may need to do exercises to help with movement and strength in your back. Ask what things are safe for you to do at home. Ask when you can go back to work or school. General instructions Do not drink alcohol. Do not smoke, vape, or use nicotine or tobacco. Doing this can slow down healing. Keep all follow-up visits. This can help  to prevent long-lasting injury and pain. Contact a health care provider if: You have a fever. Your pain isn't getting better with medicine. Your pain doesn't get better over time. You can't go back to doing normal things as planned. Get help right away if: Your pain is very bad, and it gets worse all of a sudden. You can't move any body part that's below the level of your injury. You have numbness, tingling, or weakness in any body part below the level of your injury. You can't control when you pee or poop. This information is not intended to replace advice given to you by your health care provider. Make sure you discuss any questions you have with your health care provider. Document Revised: 11/18/2023 Document Reviewed: 11/18/2023 Elsevier Patient Education  2025 [employer].

## 2024-08-25 NOTE — Assessment & Plan Note (Signed)
Advised to stay well-hydrated and avoid NSAIDs. ?

## 2024-08-25 NOTE — Progress Notes (Signed)
 Tammy Ball 83 y.o.   Chief Complaint  Patient presents with   Hospitalization Follow-up    Patient here for HFU was in the ED on 08/22/2024. Patient states her thoracic and lower lumbar is in pain from twisting. She did have imaging done to confirm compression fracture. Patient says no medication is helping with the pain.     HISTORY OF PRESENT ILLNESS: This is a 83 y.o. female here for follow-up of emergency department visit on 08/22/2024 when she presented with back pain. No injuries.  CT scan of her spine showed old and new compression fractures at different levels Chest and abdomen CT scans were unremarkable.  No other acute problems were identified. Here for pain management.  Tylenol  and tramadol  helping some but feels like she needs something little stronger. No other complaints or medical concerns today.  HPI   Prior to Admission medications   Medication Sig Start Date End Date Taking? Authorizing Provider  acetaminophen  (TYLENOL ) 500 MG tablet Take 500 mg by mouth every 6 (six) hours as needed for headache or moderate pain (pain).   Yes [provider]  albuterol  (PROVENTIL ) (2.5 MG/3ML) 0.083% nebulizer solution INHALE 3 ML BY NEBULIZATION EVERY 6 HOURS AS NEEDED FOR WHEEZING OR SHORTNESS OF BREATH 04/30/24  Yes Byrum, Lamar GORMAN, MD  albuterol  (VENTOLIN  HFA) 108 [phone] Base) MCG/ACT inhaler Inhale 1-2 puffs into the lungs every 6 (six) hours as needed for wheezing or shortness of breath. 04/29/24  Yes Shelah Lamar GORMAN, MD  Ascorbic Acid  (VITAMIN C) 1000 MG tablet Take 1,000 mg by mouth every morning.   Yes [provider]  aspirin  EC 81 MG tablet Take 81 mg by mouth every morning.   Yes [provider]  atorvastatin  (LIPITOR) 40 MG tablet TAKE 1 TABLET BY MOUTH EVERY DAY 02/02/24  Yes Jazmine Longshore Jose, MD  budesonide -glycopyrrolate -formoterol  (BREZTRI  AEROSPHERE) 160-9-4.8 MCG/ACT AERO inhaler Inhale 2 puffs into the lungs in the morning and at  bedtime. 04/29/24  Yes Shelah Lamar GORMAN, MD  Calcium  Carbonate Antacid (TUMS PO) Take 1 tablet by mouth 4 (four) times daily as needed (heartburn).   Yes [provider]  Carboxymethylcellulose Sodium (ARTIFICIAL TEARS OP) Place 1 drop into both eyes daily as needed (dry eyes).   Yes [provider]  carvedilol  (COREG ) 25 MG tablet Take 1 tablet (25 mg total) by mouth 2 (two) times daily. 04/15/24  Yes [name], [name], NP  cholecalciferol  (VITAMIN D3) 25 MCG (1000 UNIT) tablet Take 1,000 Units by mouth daily.   Yes [provider]  cycloSPORINE (RESTASIS) 0.05 % ophthalmic emulsion  08/02/24  Yes [provider]  diphenhydrAMINE  (BENADRYL ) 25 mg capsule Take 25 mg by mouth at bedtime as needed for allergies.   Yes [provider]  Guaifenesin  (MUCINEX  MAXIMUM STRENGTH) 1200 MG TB12 Take 1,200 mg by mouth every morning.   Yes [provider]  loperamide  (IMODIUM ) 2 MG capsule Take 1 capsule (2 mg total) by mouth 4 (four) times daily as needed for diarrhea or loose stools. 08/19/23  Yes [name], [name], MD  losartan  (COZAAR ) 100 MG tablet TAKE 1 TABLET BY MOUTH EVERY DAY 02/17/24  Yes Bertrice Leder, [name], MD  Omega-3 Fatty Acids  (FISH OIL) 1200 MG CAPS Take 1,200 mg by mouth every morning.   Yes [provider]  ondansetron  (ZOFRAN -ODT) 4 MG disintegrating tablet 4mg  ODT q4 hours prn nausea/vomit 08/22/24  Yes Zammit, Joseph, MD  OXYGEN  Inhale 2 L into the lungs as needed (to  keep SATS at or above 90).   Yes [provider]  sodium chloride  (OCEAN) 0.65 % SOLN nasal spray Place 1 spray into both nostrils at bedtime as needed for congestion.   Yes [provider]  traMADol  (ULTRAM ) 50 MG tablet Take 1 tablet (50 mg total) by mouth every 6 (six) hours as needed. 08/22/24  Yes Suzette Pac, MD  traZODone  (DESYREL ) 50 MG tablet Take 0.5-1 tablets (25-50 mg total) by mouth at bedtime as needed for sleep. Patient not taking:  Reported on 08/25/2024 06/16/24   Purcell [name], MD    Allergies  Allergen Reactions   Bee Venom Anaphylaxis, Shortness Of Breath, Swelling and Other (See Comments)    Swelling at site (reaction to bees and wasps)   Amlodipine  Swelling and Other (See Comments)    Swelling of the ankles and hands    Levofloxacin Other (See Comments)    Joint pain    Alendronate  Other (See Comments)    Joint pains / hypercalcemia    Hctz [Hydrochlorothiazide ] Palpitations and Other (See Comments)    Sweating     Patient Active Problem List   Diagnosis Date Noted   Class 1 obesity due to excess calories with serious comorbidity and body mass index (BMI) of 30.0 to 30.9 in adult 03/20/2023   Iron deficiency anemia 11/15/2022   Adenomatous polyp of ascending colon 11/15/2022   Adenomatous polyp of transverse colon 11/15/2022   Adenomatous polyp of descending colon 11/15/2022   Internal hemorrhoids 11/15/2022   History of myocardial infarction 09/20/2022   Gastroesophageal reflux disease without esophagitis 06/19/2022   Epigastric discomfort 06/09/2022   Chronic bilateral low back pain without sciatica 09/06/2021   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) 02/21/2021   Recurrent major depressive disorder, in full remission 02/21/2021   Atherosclerosis of aorta 02/21/2021   Var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) 02/21/2021   Chronic diastolic CHF (congestive heart failure) (HCC)    Stage 3a chronic kidney disease (HCC) 11/10/2020   Chronic respiratory failure with hypoxia (HCC) 12/26/2018   Age-related osteoporosis with current pathological fracture 11/21/2018   Renal insufficiency 05/20/2018   History of compression fracture of spine 12/10/2016   Insomnia 05/16/2016   Pneumonitis, radiation 04/11/2016   S/P lobectomy of lung 01/18/2016   Chemotherapy-induced neuropathy 11/01/2015   Non-small cell carcinoma of right lung, stage 2 (HCC) 10/03/2015    Hyperlipidemia 08/14/2013   Spondylolisthesis of lumbar region 07/03/2013   Essential hypertension 12/17/2011   COPD GOLD B-C 12/17/2011   Renal cell cancer (HCC) 12/17/2011   Depression 12/17/2011    Past Medical History:  Diagnosis Date   Anemia    was while doing chemo   Anxiety    Arthritis    Asthma    Chemotherapy-induced neuropathy 11/01/2015   CHF (congestive heart failure) (HCC)    Claustrophobia    COPD (chronic obstructive pulmonary disease) (HCC)    Depression    Encounter for antineoplastic chemotherapy 02/09/2016   GERD (gastroesophageal reflux disease)    Headache    prior to menopause   Heart murmur    History of echocardiogram    Echo 2/17: EF 60-65%, grade 1 diastolic dysfunction, mild MR, trivial pericardial effusion   History of nuclear stress test    Myoview 2/17: no ischemia or scar, EF 79%; low risk   History of radiation therapy 10/30/16-11/06/16   Hyperlipidemia    Hypertension    Insomnia 05/16/2016   Myocardial infarction (HCC)  Non-small cell carcinoma of right lung, stage 2 (HCC) 10/03/2015   Oxygen  deficiency    Pneumonia    Radiation 02/29/16-04/10/16   50.4 Gy to right central chest   Renal cell carcinoma (HCC)    L nephrectomy  in 2012    Past Surgical History:  Procedure Laterality Date   BACK SURGERY     cervical 1991   BIOPSY  11/15/2022   Procedure: BIOPSY;  Surgeon: San Sandor GAILS, DO;  Location: [facility];  Service: Gastroenterology;;   COLONOSCOPY WITH PROPOFOL  N/A 11/15/2022   Procedure: COLONOSCOPY WITH PROPOFOL ;  Surgeon: San Sandor GAILS, DO;  Location: [facility];  Service: Gastroenterology;  Laterality: N/A;   ESOPHAGOGASTRODUODENOSCOPY (EGD) WITH PROPOFOL  N/A 11/15/2022   Procedure: ESOPHAGOGASTRODUODENOSCOPY (EGD) WITH PROPOFOL ;  Surgeon: San Sandor GAILS, DO;  Location: [facility];  Service: Gastroenterology;  Laterality: N/A;   EYE SURGERY     IR GENERIC HISTORICAL  01/16/2017   IR RADIOLOGIST EVAL &  MGMT 01/16/2017 [facility]   IR GENERIC HISTORICAL  01/31/2017   IR FLUORO GUIDED NEEDLE PLC ASPIRATION/INJECTION LOC 01/31/2017 [name], MD [facility]   IR GENERIC HISTORICAL  01/31/2017   IR VERTEBROPLASTY CERV/THOR BX INC UNI/BIL INC/INJECT/IMAGING 01/31/2017 [name], MD [facility]   IR GENERIC HISTORICAL  01/31/2017   IR VERTEBROPLASTY EA ADDL (T&LS) BX INC UNI/BIL INC INJECT/IMAGING 01/31/2017 [name], MD [facility]   kidney cancer     LOBECTOMY Right    RUL   NEPHRECTOMY  2012   POLYPECTOMY  11/15/2022   Procedure: POLYPECTOMY;  Surgeon: San Sandor GAILS, DO;  Location: [facility];  Service: Gastroenterology;;   SPINE SURGERY     TUBAL LIGATION     VIDEO ASSISTED THORACOSCOPY (VATS)/WEDGE RESECTION Right 01/18/2016   Procedure: VIDEO ASSISTED THORACOSCOPY (VATS)/LUNG RESECTION, THOROCOTOMY, RIGHT UPPER LOBECTOMY, LYMPH NODE DISSECTION, PLACEMENT OF ON Q;  Surgeon: [name], MD;  Location: [facility];  Service: Thoracic;  Laterality: Right;   VIDEO BRONCHOSCOPY Bilateral 09/20/2015   Procedure: VIDEO BRONCHOSCOPY WITHOUT FLUORO;  Surgeon: [name], MD;  Location: [facility];  Service: Cardiopulmonary;  Laterality: Bilateral;   VIDEO BRONCHOSCOPY N/A 01/18/2016   Procedure: VIDEO BRONCHOSCOPY;  Surgeon: [name], MD;  Location: [facility];  Service: Thoracic;  Laterality: N/A;    Social History   Socioeconomic History   Marital status: Widowed    Spouse name: Not on file   Number of children: 4   Years of education: Not on file   Highest education level: Bachelor's degree (e.g., BA, AB, BS)  Occupational History   Not on file  Tobacco Use   Smoking status: Former    Current packs/day: 0.00    Average packs/day: 1 pack/day for 50.0 years (50.0 ttl pk-yrs)    Types: Cigarettes    Start date: 03/15/1961    Quit date: 03/16/2011    Years since quitting: 13.4   Smokeless tobacco: Never   Tobacco comments:    last 4-5 years of  smoking, smoked 0.5 pack/day   Vaping Use   Vaping status: Never Used  Substance and Sexual Activity   Alcohol  use: No    Alcohol /week: 0.0 standard drinks of alcohol    Drug use: No   Sexual activity: Not Currently  Other Topics Concern   Not on file  Social History Narrative   Marital status: divorced; not dating in 2019.      Children: 4 children; 3 grandchildren adult; 4 gg.  Lives: alone in house      Employment: part-time work; substance abuse counselor; [employer].      Tobacco: quit smoking 2012; smoked 45 years      Alcohol : none      Drugs: none      Exercise: none in 2019; due to LEFT sciatica.      ADLs: independent with ADLs; drives.       Advanced Directives: YES: HCPOA: Nicholas Martinez/son.  FULL CODE but no prolonged measures.      Occupation: Substance Abuse Administrator, [employer]   No exercise** Merged History Encounter **       ** Data from: 12/14/11 Enc Dept: [facility]       ** Data from: 12/17/11 Enc Dept: [facility]   Substance abuse counselor   Husband deceased   4 great grandchildren   Son works in same substance abuse counseling center as patient   Social Drivers of Corporate investment banker Strain: Medium Risk (06/02/2024)   Overall Financial Resource Strain (CARDIA)    Difficulty of Paying Living Expenses: Somewhat hard  Food Insecurity: No Food Insecurity (06/02/2024)   Hunger Vital Sign    Worried About Running Out of Food in the Last Year: Never true    Ran Out of Food in the Last Year: Never true  Transportation Needs: Unmet Transportation Needs (06/02/2024)   PRAPARE - Transportation    Lack of Transportation (Medical): No    Lack of Transportation (Non-Medical): Yes  Physical Activity: Inactive (06/02/2024)   Exercise Vital Sign    Days of Exercise per Week: 0 days    Minutes of Exercise per Session: 0 min  Stress: No Stress Concern Present (06/02/2024)   [employer] of Occupational  Health - Occupational Stress Questionnaire    Feeling of Stress: Only a little  Social Connections: Socially Isolated (06/02/2024)   Social Connection and Isolation Panel    Frequency of Communication with Friends and Family: More than three times a week    Frequency of Social Gatherings with Friends and Family: More than three times a week    Attends Religious Services: Never    Database administrator or Organizations: No    Attends Banker Meetings: Never    Marital Status: Divorced  Catering manager Violence: Not At Risk (06/02/2024)   Humiliation, Afraid, Rape, and Kick questionnaire    Fear of Current or Ex-Partner: No    Emotionally Abused: No    Physically Abused: No    Sexually Abused: No    Family History  Problem Relation Age of Onset   Cancer Mother        Bladder Cancer   Hypertension Mother    CAD Mother 54   Heart disease Sister    Breast cancer Sister    Birth defects Sister    Obesity Brother    Cancer Maternal Grandmother    Heart attack Daughter 77       s/p CABG   Glaucoma Daughter    Colon cancer Neg Hx    Colon polyps Neg Hx    Esophageal cancer Neg Hx    Rectal cancer Neg Hx    Stomach cancer Neg Hx      Review of Systems  Constitutional: Negative.  Negative for chills and fever.  HENT: Negative.  Negative for congestion and sore throat.   Respiratory: Negative.  Negative for cough and shortness of breath.  Cardiovascular: Negative.  Negative for chest pain and palpitations.  Gastrointestinal:  Negative for abdominal pain, diarrhea, nausea and vomiting.  Genitourinary: Negative.  Negative for dysuria and hematuria.  Musculoskeletal:  Positive for back pain.  Skin: Negative.  Negative for rash.  Neurological: Negative.  Negative for dizziness and headaches.  All other systems reviewed and are negative.   Today's Vitals   08/25/24 1350  BP: (!) 150/90  Pulse: 67  Temp: 98.7 F (37.1 C)  TempSrc: Oral  SpO2: 95%  Height: 5'  3 (1.6 m)   Body mass index is 29.05 kg/m.   Physical Exam Vitals reviewed.  Constitutional:      Appearance: Normal appearance.  HENT:     Head: Normocephalic.  Eyes:     Extraocular Movements: Extraocular movements intact.  Cardiovascular:     Rate and Rhythm: Normal rate and regular rhythm.     Pulses: Normal pulses.     Heart sounds: Normal heart sounds.  Pulmonary:     Effort: Pulmonary effort is normal.     Breath sounds: Normal breath sounds.  Abdominal:     Palpations: Abdomen is soft.     Tenderness: There is no abdominal tenderness.  Musculoskeletal:     Cervical back: No tenderness.  Lymphadenopathy:     Cervical: No cervical adenopathy.  Skin:    General: Skin is warm and dry.  Neurological:     Mental Status: She is alert and oriented to person, place, and time.  Psychiatric:        Mood and Affect: Mood normal.        Behavior: Behavior normal.      ASSESSMENT & PLAN: A total of 42 minutes was spent with the patient and counseling/coordination of care regarding preparing for this visit, review of most recent office visit notes, review of most recent emergency department visit notes, review of most recent imaging reports including CT scan of thoracic and lumbar spine, pain management and medication changes, review of multiple chronic medical conditions and their management, review of all medications, review of most recent bloodwork results, review of health maintenance items, education on nutrition, prognosis, documentation, and need for follow up.   Problem List Items Addressed This Visit       Cardiovascular and Mediastinum   Essential hypertension   BP Readings from Last 3 Encounters:  08/25/24 (!) 150/90  08/22/24 (!) 179/89  08/07/24 (!) 149/77  Elevated blood pressure reading in the office Pain contributing to elevation Pain management discussed Normal blood pressure readings at home Continue carvedilol  25 mg twice daily and losartan  100 mg  daily           Respiratory   Chronic respiratory failure with hypoxia (HCC)   Overall stable. Plan to continue her current oxygen  2 L/min as she has been using it.         Musculoskeletal and Integument   Closed compression fracture of thoracic vertebra (HCC) - Primary   Recent CT scan of spine reviewed with patient Report reviewed Pain management discussed Recommend Tylenol  for mild to moderate pain and Norco for moderate to severe pain Needs evaluation by spine surgeon Referral placed today      Relevant Medications   HYDROcodone -acetaminophen  (NORCO/VICODIN) 5-325 MG tablet   Other Relevant Orders   Ambulatory referral to Spine Surgery   Ambulatory referral to Home Health     Genitourinary   Stage 3a chronic kidney disease (HCC)   Advised to stay well-hydrated and avoid NSAIDs  Other Visit Diagnoses       Hospital discharge follow-up          Patient Instructions  Broken Bones of the Spine (Spine Compression Fracture): What to Know  A spine compression fracture happens when one or more of the bones of the spine breaks (fractures) and collapses from trauma or weak bones. With this type of fracture, the spine is pushed into a wedge shape. It often happens in the middle or lower part of your spine. What are the causes? This type of fracture may be caused by: Osteoporosis. This condition causes bones to thin, lose density, and grow weak. It's the most common cause. A fall. A car or motorcycle accident. Cancer. Trauma, such as a heavy, direct hit to your head or back. What increases the risk? You're more likely to get this kind of fracture if: You're 71 years of age or older. You have osteoporosis. You have certain types of cancer. These include: Multiple myeloma. Lymphoma. Prostate cancer. Lung cancer. Breast cancer. You're female. You smoke or drink alcohol . What are the signs or symptoms? Very bad pain when you move, such as when you cough or  sneeze. Pain that gets worse over time. Pain that's worse when you stand, walk, sit, or bend. Sudden pain that's so bad that it's hard for you to move. Bending or humping of the spine. Slow loss of height. Numbness, tingling, or weakness in your back and legs. Trouble walking. How is this diagnosed? You may be diagnosed based on your symptoms, medical history, and an exam. During the exam, your health care provider may tap along the length of your spine to check for tender spots.  Tests may also be done. These may include. A bone mineral density test. Imaging tests, such as: An X-ray of your spine. A CT scan. An MRI. How is this treated? Some fractures may heal on their own. If you need treatment, it may include: Pain medicine. Rest. A back brace. Physical therapy. Medicine to make your bones stronger. Calcium  and vitamin D supplements. In some cases, you may need surgery, such as if: Your back becomes misshapen. You have nerve pain or weakness. Your pain doesn't get better with other treatment. The types of surgeries that may be done include: Vertebroplasty. This is when bone cement is put into the collapsed bones to help make them more stable. Balloon kyphoplasty. This is when the collapsed bones are expanded with a balloon. Then, bone cement is put into them. Spinal fusion. This is when the collapsed bones are connected (fused) to healthy bones in the spine. Follow these instructions at home: Medicines Take your medicines only as told. You may need to take steps to help treat or prevent trouble pooping (constipation), such as: Taking medicines to help you poop. Eating foods high in fiber, like beans, whole grains, and fresh fruits and vegetables. Drinking more fluids as told. Ask your provider if it's safe to drive or use machines while taking your medicine. If you have a brace: Wear the brace as told. Take it off only if your provider says you can. Keep the brace  clean. Managing pain, stiffness, and swelling  Use ice or an ice pack as told. If you have a brace that you can take off, remove it only as told. Place a towel between your skin and the ice. Leave the ice on for 20 minutes, 2-3 times a day. If your skin turns red, take off the ice right away to prevent  skin damage. The risk of damage is higher if you can't feel pain, heat, or cold. Activity Rest as told. Get up to take short walks many times during the day. This helps you breathe better and keeps your blood flowing. Ask for help if you feel weak or unsteady. Exercise as told. You may need to do exercises to help with movement and strength in your back. Ask what things are safe for you to do at home. Ask when you can go back to work or school. General instructions Do not drink alcohol . Do not smoke, vape, or use nicotine or tobacco. Doing this can slow down healing. Keep all follow-up visits. This can help to prevent long-lasting injury and pain. Contact a health care provider if: You have a fever. Your pain isn't getting better with medicine. Your pain doesn't get better over time. You can't go back to doing normal things as planned. Get help right away if: Your pain is very bad, and it gets worse all of a sudden. You can't move any body part that's below the level of your injury. You have numbness, tingling, or weakness in any body part below the level of your injury. You can't control when you pee or poop. This information is not intended to replace advice given to you by your health care provider. Make sure you discuss any questions you have with your health care provider. Document Revised: 11/18/2023 Document Reviewed: 11/18/2023 Elsevier Patient Education  2025 Elsevier Inc.      Emil Schaumann, MD [facility]

## 2024-08-25 NOTE — Assessment & Plan Note (Signed)
 Overall stable.  Plan to continue her current oxygen  2 L/min as she has been using it.

## 2024-08-25 NOTE — Assessment & Plan Note (Signed)
 BP Readings from Last 3 Encounters:  08/25/24 (!) 150/90  08/22/24 (!) 179/89  08/07/24 (!) 149/77  Elevated blood pressure reading in the office Pain contributing to elevation Pain management discussed Normal blood pressure readings at home Continue carvedilol  25 mg twice daily and losartan  100 mg daily

## 2024-08-26 ENCOUNTER — Encounter (HOSPITAL_COMMUNITY): Payer: Self-pay

## 2024-08-26 ENCOUNTER — Inpatient Hospital Stay (HOSPITAL_COMMUNITY)
Admission: EM | Admit: 2024-08-26 | Discharge: 2024-09-03 | DRG: 515 | Disposition: A | Discharge status: Skilled Nursing Facility | Attending: Internal Medicine | Admitting: Internal Medicine

## 2024-08-26 ENCOUNTER — Other Ambulatory Visit: Payer: Self-pay

## 2024-08-26 ENCOUNTER — Emergency Department (HOSPITAL_COMMUNITY)

## 2024-08-26 DIAGNOSIS — I130 Hypertensive heart and chronic kidney disease with heart failure and stage 1 through stage 4 chronic kidney disease, or unspecified chronic kidney disease: Secondary | ICD-10-CM | POA: Diagnosis present

## 2024-08-26 DIAGNOSIS — Z902 Acquired absence of lung [part of]: Secondary | ICD-10-CM | POA: Diagnosis not present

## 2024-08-26 DIAGNOSIS — J9622 Acute and chronic respiratory failure with hypercapnia: Secondary | ICD-10-CM | POA: Diagnosis present

## 2024-08-26 DIAGNOSIS — R1314 Dysphagia, pharyngoesophageal phase: Secondary | ICD-10-CM | POA: Diagnosis not present

## 2024-08-26 DIAGNOSIS — Z905 Acquired absence of kidney: Secondary | ICD-10-CM

## 2024-08-26 DIAGNOSIS — J9621 Acute and chronic respiratory failure with hypoxia: Secondary | ICD-10-CM | POA: Diagnosis present

## 2024-08-26 DIAGNOSIS — M8000XS Age-related osteoporosis with current pathological fracture, unspecified site, sequela: Secondary | ICD-10-CM | POA: Diagnosis not present

## 2024-08-26 DIAGNOSIS — R2689 Other abnormalities of gait and mobility: Secondary | ICD-10-CM | POA: Diagnosis not present

## 2024-08-26 DIAGNOSIS — F419 Anxiety disorder, unspecified: Secondary | ICD-10-CM | POA: Diagnosis not present

## 2024-08-26 DIAGNOSIS — S22060D Wedge compression fracture of T7-T8 vertebra, subsequent encounter for fracture with routine healing: Secondary | ICD-10-CM

## 2024-08-26 DIAGNOSIS — J441 Chronic obstructive pulmonary disease with (acute) exacerbation: Secondary | ICD-10-CM | POA: Diagnosis present

## 2024-08-26 DIAGNOSIS — Z91030 Bee allergy status: Secondary | ICD-10-CM

## 2024-08-26 DIAGNOSIS — E871 Hypo-osmolality and hyponatremia: Secondary | ICD-10-CM | POA: Diagnosis present

## 2024-08-26 DIAGNOSIS — Z8249 Family history of ischemic heart disease and other diseases of the circulatory system: Secondary | ICD-10-CM | POA: Diagnosis not present

## 2024-08-26 DIAGNOSIS — Z87891 Personal history of nicotine dependence: Secondary | ICD-10-CM

## 2024-08-26 DIAGNOSIS — C642 Malignant neoplasm of left kidney, except renal pelvis: Secondary | ICD-10-CM | POA: Diagnosis not present

## 2024-08-26 DIAGNOSIS — F05 Delirium due to known physiological condition: Secondary | ICD-10-CM | POA: Diagnosis present

## 2024-08-26 DIAGNOSIS — R011 Cardiac murmur, unspecified: Secondary | ICD-10-CM | POA: Diagnosis not present

## 2024-08-26 DIAGNOSIS — M8000XA Age-related osteoporosis with current pathological fracture, unspecified site, initial encounter for fracture: Secondary | ICD-10-CM | POA: Diagnosis present

## 2024-08-26 DIAGNOSIS — I10 Essential (primary) hypertension: Secondary | ICD-10-CM | POA: Diagnosis not present

## 2024-08-26 DIAGNOSIS — J449 Chronic obstructive pulmonary disease, unspecified: Secondary | ICD-10-CM | POA: Diagnosis not present

## 2024-08-26 DIAGNOSIS — R918 Other nonspecific abnormal finding of lung field: Secondary | ICD-10-CM | POA: Diagnosis not present

## 2024-08-26 DIAGNOSIS — I129 Hypertensive chronic kidney disease with stage 1 through stage 4 chronic kidney disease, or unspecified chronic kidney disease: Secondary | ICD-10-CM | POA: Diagnosis not present

## 2024-08-26 DIAGNOSIS — Z7401 Bed confinement status: Secondary | ICD-10-CM | POA: Diagnosis not present

## 2024-08-26 DIAGNOSIS — Z59868 Other specified financial insecurity: Secondary | ICD-10-CM

## 2024-08-26 DIAGNOSIS — M6281 Muscle weakness (generalized): Secondary | ICD-10-CM | POA: Diagnosis not present

## 2024-08-26 DIAGNOSIS — E861 Hypovolemia: Secondary | ICD-10-CM | POA: Diagnosis present

## 2024-08-26 DIAGNOSIS — F32A Depression, unspecified: Secondary | ICD-10-CM | POA: Diagnosis present

## 2024-08-26 DIAGNOSIS — I509 Heart failure, unspecified: Secondary | ICD-10-CM

## 2024-08-26 DIAGNOSIS — S22060A Wedge compression fracture of T7-T8 vertebra, initial encounter for closed fracture: Principal | ICD-10-CM | POA: Diagnosis present

## 2024-08-26 DIAGNOSIS — Z9981 Dependence on supplemental oxygen: Secondary | ICD-10-CM

## 2024-08-26 DIAGNOSIS — T380X5A Adverse effect of glucocorticoids and synthetic analogues, initial encounter: Secondary | ICD-10-CM | POA: Diagnosis present

## 2024-08-26 DIAGNOSIS — K219 Gastro-esophageal reflux disease without esophagitis: Secondary | ICD-10-CM | POA: Diagnosis present

## 2024-08-26 DIAGNOSIS — Z85528 Personal history of other malignant neoplasm of kidney: Secondary | ICD-10-CM

## 2024-08-26 DIAGNOSIS — R54 Age-related physical debility: Secondary | ICD-10-CM | POA: Diagnosis present

## 2024-08-26 DIAGNOSIS — M1990 Unspecified osteoarthritis, unspecified site: Secondary | ICD-10-CM | POA: Diagnosis not present

## 2024-08-26 DIAGNOSIS — N1831 Chronic kidney disease, stage 3a: Secondary | ICD-10-CM | POA: Diagnosis present

## 2024-08-26 DIAGNOSIS — I2510 Atherosclerotic heart disease of native coronary artery without angina pectoris: Secondary | ICD-10-CM | POA: Diagnosis present

## 2024-08-26 DIAGNOSIS — Z8052 Family history of malignant neoplasm of bladder: Secondary | ICD-10-CM

## 2024-08-26 DIAGNOSIS — M438X4 Other specified deforming dorsopathies, thoracic region: Secondary | ICD-10-CM | POA: Diagnosis not present

## 2024-08-26 DIAGNOSIS — J9811 Atelectasis: Secondary | ICD-10-CM | POA: Diagnosis present

## 2024-08-26 DIAGNOSIS — E875 Hyperkalemia: Secondary | ICD-10-CM | POA: Diagnosis present

## 2024-08-26 DIAGNOSIS — S22000A Wedge compression fracture of unspecified thoracic vertebra, initial encounter for closed fracture: Secondary | ICD-10-CM | POA: Diagnosis present

## 2024-08-26 DIAGNOSIS — J811 Chronic pulmonary edema: Secondary | ICD-10-CM | POA: Diagnosis not present

## 2024-08-26 DIAGNOSIS — I252 Old myocardial infarction: Secondary | ICD-10-CM | POA: Diagnosis not present

## 2024-08-26 DIAGNOSIS — Z881 Allergy status to other antibiotic agents status: Secondary | ICD-10-CM

## 2024-08-26 DIAGNOSIS — Z79899 Other long term (current) drug therapy: Secondary | ICD-10-CM

## 2024-08-26 DIAGNOSIS — Z7982 Long term (current) use of aspirin: Secondary | ICD-10-CM

## 2024-08-26 DIAGNOSIS — Z9221 Personal history of antineoplastic chemotherapy: Secondary | ICD-10-CM

## 2024-08-26 DIAGNOSIS — Z5982 Transportation insecurity: Secondary | ICD-10-CM

## 2024-08-26 DIAGNOSIS — R059 Cough, unspecified: Secondary | ICD-10-CM | POA: Diagnosis not present

## 2024-08-26 DIAGNOSIS — E785 Hyperlipidemia, unspecified: Secondary | ICD-10-CM | POA: Diagnosis present

## 2024-08-26 DIAGNOSIS — I959 Hypotension, unspecified: Secondary | ICD-10-CM | POA: Diagnosis present

## 2024-08-26 DIAGNOSIS — C3491 Malignant neoplasm of unspecified part of right bronchus or lung: Secondary | ICD-10-CM | POA: Diagnosis present

## 2024-08-26 DIAGNOSIS — D631 Anemia in chronic kidney disease: Secondary | ICD-10-CM | POA: Diagnosis not present

## 2024-08-26 DIAGNOSIS — I5032 Chronic diastolic (congestive) heart failure: Secondary | ICD-10-CM | POA: Diagnosis not present

## 2024-08-26 DIAGNOSIS — R0902 Hypoxemia: Secondary | ICD-10-CM | POA: Diagnosis not present

## 2024-08-26 DIAGNOSIS — I110 Hypertensive heart disease with heart failure: Secondary | ICD-10-CM | POA: Diagnosis not present

## 2024-08-26 DIAGNOSIS — M40204 Unspecified kyphosis, thoracic region: Secondary | ICD-10-CM | POA: Diagnosis not present

## 2024-08-26 DIAGNOSIS — J90 Pleural effusion, not elsewhere classified: Secondary | ICD-10-CM | POA: Diagnosis not present

## 2024-08-26 DIAGNOSIS — I5033 Acute on chronic diastolic (congestive) heart failure: Secondary | ICD-10-CM | POA: Diagnosis not present

## 2024-08-26 DIAGNOSIS — F329 Major depressive disorder, single episode, unspecified: Secondary | ICD-10-CM | POA: Diagnosis not present

## 2024-08-26 DIAGNOSIS — J45909 Unspecified asthma, uncomplicated: Secondary | ICD-10-CM | POA: Diagnosis not present

## 2024-08-26 DIAGNOSIS — M4854XA Collapsed vertebra, not elsewhere classified, thoracic region, initial encounter for fracture: Secondary | ICD-10-CM | POA: Diagnosis not present

## 2024-08-26 DIAGNOSIS — F5102 Adjustment insomnia: Secondary | ICD-10-CM

## 2024-08-26 DIAGNOSIS — Z888 Allergy status to other drugs, medicaments and biological substances status: Secondary | ICD-10-CM | POA: Diagnosis not present

## 2024-08-26 DIAGNOSIS — C649 Malignant neoplasm of unspecified kidney, except renal pelvis: Secondary | ICD-10-CM | POA: Diagnosis present

## 2024-08-26 DIAGNOSIS — R0602 Shortness of breath: Secondary | ICD-10-CM | POA: Diagnosis not present

## 2024-08-26 DIAGNOSIS — Z923 Personal history of irradiation: Secondary | ICD-10-CM

## 2024-08-26 DIAGNOSIS — E782 Mixed hyperlipidemia: Secondary | ICD-10-CM | POA: Diagnosis not present

## 2024-08-26 DIAGNOSIS — E860 Dehydration: Secondary | ICD-10-CM | POA: Diagnosis present

## 2024-08-26 DIAGNOSIS — Z803 Family history of malignant neoplasm of breast: Secondary | ICD-10-CM

## 2024-08-26 DIAGNOSIS — J168 Pneumonia due to other specified infectious organisms: Secondary | ICD-10-CM | POA: Diagnosis not present

## 2024-08-26 DIAGNOSIS — J9620 Acute and chronic respiratory failure, unspecified whether with hypoxia or hypercapnia: Principal | ICD-10-CM | POA: Diagnosis present

## 2024-08-26 DIAGNOSIS — M549 Dorsalgia, unspecified: Secondary | ICD-10-CM | POA: Diagnosis not present

## 2024-08-26 DIAGNOSIS — M810 Age-related osteoporosis without current pathological fracture: Secondary | ICD-10-CM | POA: Diagnosis present

## 2024-08-26 DIAGNOSIS — J189 Pneumonia, unspecified organism: Principal | ICD-10-CM

## 2024-08-26 DIAGNOSIS — G4700 Insomnia, unspecified: Secondary | ICD-10-CM | POA: Diagnosis not present

## 2024-08-26 DIAGNOSIS — I5031 Acute diastolic (congestive) heart failure: Secondary | ICD-10-CM | POA: Diagnosis not present

## 2024-08-26 DIAGNOSIS — Z85118 Personal history of other malignant neoplasm of bronchus and lung: Secondary | ICD-10-CM

## 2024-08-26 DIAGNOSIS — F40240 Claustrophobia: Secondary | ICD-10-CM | POA: Diagnosis present

## 2024-08-26 LAB — URINALYSIS, ROUTINE W REFLEX MICROSCOPIC
Bilirubin Urine: NEGATIVE
Glucose, UA: NEGATIVE mg/dL
Hgb urine dipstick: NEGATIVE
Ketones, ur: NEGATIVE mg/dL
Nitrite: NEGATIVE
Protein, ur: NEGATIVE mg/dL
Specific Gravity, Urine: 1.01 (ref 1.005–1.030)
WBC, UA: >50 WBC/hpf (ref 0–5)
pH: 5 (ref 5.0–8.0)

## 2024-08-26 LAB — COMPREHENSIVE METABOLIC PANEL WITH GFR
ALT: 16 U/L (ref 0–44)
AST: 25 U/L (ref 15–41)
Albumin: 4.2 g/dL (ref 3.5–5.0)
Alkaline Phosphatase: 81 U/L (ref 38–126)
Anion gap: 9 (ref 5–15)
BUN: 29 mg/dL — ABNORMAL HIGH (ref 8–23)
CO2: 33 mmol/L — ABNORMAL HIGH (ref 22–32)
Calcium: 10.4 mg/dL — ABNORMAL HIGH (ref 8.9–10.3)
Chloride: 86 mmol/L — ABNORMAL LOW (ref 98–111)
Creatinine, Ser: 0.87 mg/dL (ref 0.44–1.00)
GFR, Estimated: >60 mL/min (ref >60)
Glucose, Bld: 121 mg/dL — ABNORMAL HIGH (ref 70–99)
Potassium: 5 mmol/L (ref 3.5–5.1)
Sodium: 128 mmol/L — ABNORMAL LOW (ref 135–145)
Total Bilirubin: 0.6 mg/dL (ref 0.0–1.2)
Total Protein: 6.7 g/dL (ref 6.5–8.1)

## 2024-08-26 LAB — CBC WITH DIFFERENTIAL/PLATELET
Abs Immature Granulocytes: 0.03 K/uL (ref 0.00–0.07)
Basophils Absolute: 0 K/uL (ref 0.0–0.1)
Basophils Relative: 0 %
Eosinophils Absolute: 0 K/uL (ref 0.0–0.5)
Eosinophils Relative: 0 %
HCT: 39.1 % (ref 36.0–46.0)
Hemoglobin: 12.7 g/dL (ref 12.0–15.0)
Immature Granulocytes: 0 %
Lymphocytes Relative: 4 %
Lymphs Abs: 0.5 K/uL — ABNORMAL LOW (ref 0.7–4.0)
MCH: 31.5 pg (ref 26.0–34.0)
MCHC: 32.5 g/dL (ref 30.0–36.0)
MCV: 97 fL (ref 80.0–100.0)
Monocytes Absolute: 1.3 K/uL — ABNORMAL HIGH (ref 0.1–1.0)
Monocytes Relative: 11 %
Neutro Abs: 9.8 K/uL — ABNORMAL HIGH (ref 1.7–7.7)
Neutrophils Relative %: 85 %
Platelets: 236 K/uL (ref 150–400)
RBC: 4.03 MIL/uL (ref 3.87–5.11)
RDW: 14.8 % (ref 11.5–15.5)
WBC: 11.6 K/uL — ABNORMAL HIGH (ref 4.0–10.5)
nRBC: 0 % (ref 0.0–0.2)

## 2024-08-26 LAB — TROPONIN T, HIGH SENSITIVITY
Troponin T High Sensitivity: 31 ng/L — ABNORMAL HIGH (ref 0–19)
Troponin T High Sensitivity: 28 ng/L — ABNORMAL HIGH (ref 0–19)

## 2024-08-26 LAB — PRO BRAIN NATRIURETIC PEPTIDE: Pro Brain Natriuretic Peptide: 1699 pg/mL — ABNORMAL HIGH (ref <300.0)

## 2024-08-26 LAB — CBG MONITORING, ED: Glucose-Capillary: 145 mg/dL — ABNORMAL HIGH (ref 70–99)

## 2024-08-26 MED ORDER — SODIUM CHLORIDE 0.9 % IV SOLN
500.0000 mg | INTRAVENOUS | Status: DC
  Administered 2024-08-28 – 2024-08-30 (×4): 500 mg via INTRAVENOUS
  Filled 2024-08-26 (×4): qty 5

## 2024-08-26 MED ORDER — MORPHINE SULFATE (PF) 2 MG/ML IV SOLN
2.0000 mg | INTRAVENOUS | Status: DC | PRN
  Administered 2024-08-27 (×2): 2 mg via INTRAVENOUS
  Filled 2024-08-26 (×2): qty 1

## 2024-08-26 MED ORDER — SODIUM CHLORIDE 0.9 % IV SOLN
500.0000 mg | Freq: Once | INTRAVENOUS | Status: AC
  Administered 2024-08-26: 500 mg via INTRAVENOUS
  Filled 2024-08-26: qty 5

## 2024-08-26 MED ORDER — METHYLPREDNISOLONE SODIUM SUCC 125 MG IJ SOLR
125.0000 mg | Freq: Every day | INTRAMUSCULAR | Status: AC
Start: 2024-08-26 — End: ?
  Administered 2024-08-26 – 2024-08-29 (×4): 125 mg via INTRAVENOUS
  Filled 2024-08-26 (×4): qty 2

## 2024-08-26 MED ORDER — FENTANYL CITRATE (PF) 50 MCG/ML IJ SOSY
25.0000 ug | PREFILLED_SYRINGE | Freq: Once | INTRAMUSCULAR | Status: AC
  Administered 2024-08-26: 25 ug via INTRAVENOUS
  Filled 2024-08-26: qty 1

## 2024-08-26 MED ORDER — ONDANSETRON HCL 4 MG/2ML IJ SOLN
4.0000 mg | Freq: Four times a day (QID) | INTRAMUSCULAR | Status: DC | PRN
  Administered 2024-08-27 – 2024-08-31 (×2): 4 mg via INTRAVENOUS
  Filled 2024-08-26 (×2): qty 2

## 2024-08-26 MED ORDER — ONDANSETRON HCL 4 MG PO TABS
4.0000 mg | ORAL_TABLET | Freq: Four times a day (QID) | ORAL | Status: DC | PRN
  Administered 2024-09-01 – 2024-09-02 (×2): 4 mg via ORAL
  Filled 2024-08-26 (×2): qty 1

## 2024-08-26 MED ORDER — FUROSEMIDE 10 MG/ML IJ SOLN
40.0000 mg | Freq: Once | INTRAMUSCULAR | Status: AC
  Administered 2024-08-26: 40 mg via INTRAVENOUS
  Filled 2024-08-26: qty 4

## 2024-08-26 MED ORDER — SODIUM CHLORIDE 0.9 % IV SOLN
1.0000 g | Freq: Once | INTRAVENOUS | Status: AC
  Administered 2024-08-26: 1 g via INTRAVENOUS
  Filled 2024-08-26: qty 10

## 2024-08-26 MED ORDER — ENOXAPARIN SODIUM 40 MG/0.4ML IJ SOSY
40.0000 mg | PREFILLED_SYRINGE | INTRAMUSCULAR | Status: DC
  Administered 2024-08-27: 40 mg via SUBCUTANEOUS
  Filled 2024-08-26: qty 0.4

## 2024-08-26 MED ORDER — IPRATROPIUM-ALBUTEROL 0.5-2.5 (3) MG/3ML IN SOLN
3.0000 mL | Freq: Once | RESPIRATORY_TRACT | Status: AC
  Administered 2024-08-26: 3 mL via RESPIRATORY_TRACT
  Filled 2024-08-26: qty 3

## 2024-08-26 MED ORDER — SODIUM CHLORIDE 0.9 % IV SOLN
1.0000 g | INTRAVENOUS | Status: DC
  Administered 2024-08-27 – 2024-08-30 (×4): 1 g via INTRAVENOUS
  Filled 2024-08-26 (×4): qty 10

## 2024-08-26 MED ORDER — FUROSEMIDE 10 MG/ML IJ SOLN
20.0000 mg | Freq: Two times a day (BID) | INTRAMUSCULAR | Status: DC
  Administered 2024-08-27 (×2): 20 mg via INTRAVENOUS
  Filled 2024-08-26 (×2): qty 2

## 2024-08-26 NOTE — ED Triage Notes (Signed)
 Pt BIB ems for back pain, nausea, and SOB. Pt was diagnosed with a spinal fracture on Saturday, states they didn't do anything about it, she is still in pain 10/10. States she is having a hard time catching her breath ever since the accident. Took oxycodone  at 3:40p and tramadol  at noon. Given 1 duo-neb from ems at 5pm. A&Ox4.

## 2024-08-26 NOTE — H&P (Signed)
 History and Physical    Patient: Tammy Ball DOB: Sep 12, 1941 DOA: 08/26/2024 DOS: the patient was seen and examined on 08/26/2024 PCP: Purcell [name], MD  Patient coming from: Home  Chief Complaint:  Chief Complaint  Patient presents with   Shortness of Breath   Back Pain   HPI: Tammy Ball is a 83 y.o. female with medical history significant of thoracic spinal fracture involving T7 that was recently diagnosed, history of lung cancer status post lobectomy and chemotherapy, history of renal cell carcinoma, chemotherapy induced neuropathy, diastolic dysfunction CHF, COPD with chronic respiratory failure on 2 L of oxygen  at home, depression, essential hypertension, hyperlipidemia, coronary artery disease, radiation induced neuropathy, history of left nephrectomy who has been following up with PCP as well as emergency room after significant back pain that started about 2 weeks ago.  She was seen 2 days ago also with similar evaluation by virtual visit.  Complaining still of the back pain.  Patient was seen yesterday by her PCP for hospital follow-up visit.  At that time CT scan of the spine showed old and new compression fractures at different levels.  PCP assessed the patient.  She is on pain medicine but came back to the ER today secondary to worsening pain at 10 out of 10.  Patient also has worsening shortness of breath.  She is normally on 2 L/min but EMS had to put her on 5 L/min.  She was seen and evaluated in the ER.  Patient has chest x-ray and CT that showed significant increase in pulmonary edema and possible pneumonia.  Patient has been admitted with acute on chronic hypoxic respiratory failure probably related to poor inspiratory effort and possible pneumonia and CHF.  She is hemodynamically stable but still in pain and mild distress.  Review of Systems: As mentioned in the history of present illness. All other systems reviewed and are negative. Past Medical  History:  Diagnosis Date   Anemia    was while doing chemo   Anxiety    Arthritis    Asthma    Chemotherapy-induced neuropathy 11/01/2015   CHF (congestive heart failure) (HCC)    Claustrophobia    COPD (chronic obstructive pulmonary disease) (HCC)    Depression    Encounter for antineoplastic chemotherapy 02/09/2016   GERD (gastroesophageal reflux disease)    Headache    prior to menopause   Heart murmur    History of echocardiogram    Echo 2/17: EF 60-65%, grade 1 diastolic dysfunction, mild MR, trivial pericardial effusion   History of nuclear stress test    Myoview 2/17: no ischemia or scar, EF 79%; low risk   History of radiation therapy 10/30/16-11/06/16   Hyperlipidemia    Hypertension    Insomnia 05/16/2016   Myocardial infarction (HCC)    Non-small cell carcinoma of right lung, stage 2 (HCC) 10/03/2015   Oxygen  deficiency    Pneumonia    Radiation 02/29/16-04/10/16   50.4 Gy to right central chest   Renal cell carcinoma (HCC)    L nephrectomy  in 2012   Past Surgical History:  Procedure Laterality Date   BACK SURGERY     cervical 1991   BIOPSY  11/15/2022   Procedure: BIOPSY;  Surgeon: San Sandor GAILS, DO;  Location: [facility];  Service: Gastroenterology;;   COLONOSCOPY WITH PROPOFOL  N/A 11/15/2022   Procedure: COLONOSCOPY WITH PROPOFOL ;  Surgeon: San Sandor GAILS, DO;  Location: [facility];  Service: Gastroenterology;  Laterality: N/A;  ESOPHAGOGASTRODUODENOSCOPY (EGD) WITH PROPOFOL  N/A 11/15/2022   Procedure: ESOPHAGOGASTRODUODENOSCOPY (EGD) WITH PROPOFOL ;  Surgeon: San Sandor GAILS, DO;  Location: [facility];  Service: Gastroenterology;  Laterality: N/A;   EYE SURGERY     IR GENERIC HISTORICAL  01/16/2017   IR RADIOLOGIST EVAL & MGMT 01/16/2017 [facility]   IR GENERIC HISTORICAL  01/31/2017   IR FLUORO GUIDED NEEDLE PLC ASPIRATION/INJECTION LOC 01/31/2017 [name], MD [facility]   IR GENERIC HISTORICAL  01/31/2017   IR VERTEBROPLASTY  CERV/THOR BX INC UNI/BIL INC/INJECT/IMAGING 01/31/2017 [name], MD [facility]   IR GENERIC HISTORICAL  01/31/2017   IR VERTEBROPLASTY EA ADDL (T&LS) BX INC UNI/BIL INC INJECT/IMAGING 01/31/2017 [name], MD [facility]   kidney cancer     LOBECTOMY Right    RUL   NEPHRECTOMY  2012   POLYPECTOMY  11/15/2022   Procedure: POLYPECTOMY;  Surgeon: San Sandor GAILS, DO;  Location: [facility];  Service: Gastroenterology;;   SPINE SURGERY     TUBAL LIGATION     VIDEO ASSISTED THORACOSCOPY (VATS)/WEDGE RESECTION Right 01/18/2016   Procedure: VIDEO ASSISTED THORACOSCOPY (VATS)/LUNG RESECTION, THOROCOTOMY, RIGHT UPPER LOBECTOMY, LYMPH NODE DISSECTION, PLACEMENT OF ON Q;  Surgeon: [name], MD;  Location: [facility];  Service: Thoracic;  Laterality: Right;   VIDEO BRONCHOSCOPY Bilateral 09/20/2015   Procedure: VIDEO BRONCHOSCOPY WITHOUT FLUORO;  Surgeon: [name], MD;  Location: [facility];  Service: Cardiopulmonary;  Laterality: Bilateral;   VIDEO BRONCHOSCOPY N/A 01/18/2016   Procedure: VIDEO BRONCHOSCOPY;  Surgeon: [name], MD;  Location: [facility] OR;  Service: Thoracic;  Laterality: N/A;   Social History:  reports that she quit smoking about 13 years ago. Her smoking use included cigarettes. She started smoking about 63 years ago. She has a 50 pack-year smoking history. She has never used smokeless tobacco. She reports that she does not drink alcohol  and does not use drugs.  Allergies  Allergen Reactions   Bee Venom Anaphylaxis, Shortness Of Breath, Swelling and Other (See Comments)    Swelling at site (reaction to bees and wasps)   Amlodipine  Swelling and Other (See Comments)    Swelling of the ankles and hands    Levofloxacin Other (See Comments)    Joint pain    Alendronate  Other (See Comments)    Joint pains / hypercalcemia    Hctz [Hydrochlorothiazide ] Palpitations and Other (See Comments)    Sweating     Family History  Problem Relation Age of  Onset   Cancer Mother        Bladder Cancer   Hypertension Mother    CAD Mother 13   Heart disease Sister    Breast cancer Sister    Birth defects Sister    Obesity Brother    Cancer Maternal Grandmother    Heart attack Daughter 98       s/p CABG   Glaucoma Daughter    Colon cancer Neg Hx    Colon polyps Neg Hx    Esophageal cancer Neg Hx    Rectal cancer Neg Hx    Stomach cancer Neg Hx     Prior to Admission medications   Medication Sig Start Date End Date Taking? Authorizing Provider  acetaminophen  (TYLENOL ) 500 MG tablet Take 500 mg by mouth every 6 (six) hours as needed for headache or moderate pain (pain).    [provider]  albuterol  (PROVENTIL ) (2.5 MG/3ML) 0.083% nebulizer solution INHALE 3 ML BY NEBULIZATION EVERY 6 HOURS AS NEEDED FOR WHEEZING OR  SHORTNESS OF BREATH 04/30/24   [name], MD  albuterol  (VENTOLIN  HFA) 108 [id number] Base) MCG/ACT inhaler Inhale 1-2 puffs into the lungs every 6 (six) hours as needed for wheezing or shortness of breath. 04/29/24   [name], MD  Ascorbic Acid  (VITAMIN C) 1000 MG tablet Take 1,000 mg by mouth every morning.    [provider]  aspirin  EC 81 MG tablet Take 81 mg by mouth every morning.    [provider]  atorvastatin  (LIPITOR) 40 MG tablet TAKE 1 TABLET BY MOUTH EVERY DAY 02/02/24   [name], Miguel Jose, MD  budesonide -glycopyrrolate -formoterol  (BREZTRI  AEROSPHERE) 160-9-4.8 MCG/ACT AERO inhaler Inhale 2 puffs into the lungs in the morning and at bedtime. 04/29/24   [name], MD  Calcium  Carbonate Antacid (TUMS PO) Take 1 tablet by mouth 4 (four) times daily as needed (heartburn).    [provider]  Carboxymethylcellulose Sodium (ARTIFICIAL TEARS OP) Place 1 drop into both eyes daily as needed (dry eyes).    [provider]  carvedilol  (COREG ) 25 MG tablet Take 1 tablet (25 mg total) by mouth 2 (two) times daily. 04/15/24   Emelia [name], NP  cholecalciferol  (VITAMIN D3) 25  MCG (1000 UNIT) tablet Take 1,000 Units by mouth daily.    [provider]  cycloSPORINE (RESTASIS) 0.05 % ophthalmic emulsion  08/02/24   [provider]  diphenhydrAMINE  (BENADRYL ) 25 mg capsule Take 25 mg by mouth at bedtime as needed for allergies.    [provider]  Guaifenesin  (MUCINEX  MAXIMUM STRENGTH) 1200 MG TB12 Take 1,200 mg by mouth every morning.    [provider]  HYDROcodone -acetaminophen  (NORCO/VICODIN) 5-325 MG tablet Take 1 tablet by mouth every 6 (six) hours as needed. 08/25/24   Purcell [name], MD  loperamide  (IMODIUM ) 2 MG capsule Take 1 capsule (2 mg total) by mouth 4 (four) times daily as needed for diarrhea or loose stools. 08/19/23   [name], [name], MD  losartan  (COZAAR ) 100 MG tablet TAKE 1 TABLET BY MOUTH EVERY DAY 02/17/24   Purcell [name], MD  Omega-3 Fatty Acids  (FISH OIL) 1200 MG CAPS Take 1,200 mg by mouth every morning.    [provider]  ondansetron  (ZOFRAN -ODT) 4 MG disintegrating tablet 4mg  ODT q4 hours prn nausea/vomit 08/22/24   Zammit, Joseph, MD  OXYGEN  Inhale 2 L into the lungs as needed (to keep SATS at or above 90).    [provider]  sodium chloride  (OCEAN) 0.65 % SOLN nasal spray Place 1 spray into both nostrils at bedtime as needed for congestion.    [provider]  traMADol  (ULTRAM ) 50 MG tablet Take 1 tablet (50 mg total) by mouth every 6 (six) hours as needed. 08/22/24   Zammit, Joseph, MD  traZODone  (DESYREL ) 50 MG tablet Take 0.5-1 tablets (25-50 mg total) by mouth at bedtime as needed for sleep. Patient not taking: Reported on 08/25/2024 06/16/24   Purcell [name], MD    Physical Exam: Vitals:   08/26/24 1900 08/26/24 1915 08/26/24 2055 08/26/24 2115  BP: (!) 162/76   (!) 140/122  Pulse: 73 75  80  Resp:    19  Temp:   97.6 F (36.4 C)   TempSrc:   Oral   SpO2: 93% 90%  91%  Weight:      Height:       Constitutional: Chronically ill looking in mild  distress due to pain,  Eyes: PERRL, lids and conjunctivae normal ENMT: Mucous membranes  are moist. Posterior pharynx clear of any exudate or lesions.Normal dentition.  Neck: normal, supple, no masses, no thyromegaly Respiratory: Coarse breath sounds with mild expiratory wheezing but mainly crackles and rales, normal respiratory effort. No accessory muscle use.  Cardiovascular: Regular rate and rhythm, no murmurs / rubs / gallops. No extremity edema. 2+ pedal pulses. No carotid bruits.  Abdomen: no tenderness, no masses palpated. No hepatosplenomegaly. Bowel sounds positive.  Musculoskeletal: Good range of motion, diffuse tenderness on the spine  skin: no rashes, lesions, ulcers. No induration Neurologic: CN 2-12 grossly intact. Sensation intact, DTR normal. Strength 5/5 in all 4.  Psychiatric: Normal judgment and insight. Alert and oriented x 3. Normal mood  Data Reviewed:  Temperature 97.6, blood pressure 162/76, pulse 80 respirate of 20 oxygen  sat 90% on 5 L.  White count 11.6, sodium is 128 CO2 33 BUN 29 creatinine 0.87 chloride 86 calcium  10.4.  Glucose 121 and chest x-ray shows stable cardiomegaly with central vascular prominence and mild interstitial edema with small pleural effusions.  There is stable hazy right basilar opacity probably atelectasis edema or pneumonitis.  Also mild to moderate T7 anterior wedge compression fracture which is new from May.  EKG shows sinus rhythm with a rate of 76 abnormal R wave progression and left anterior fascicular block  Assessment and Plan:  #1 acute on chronic hypoxic respiratory failure: Most likely multifactorial.  CHF exacerbation, early pneumonia, COPD exacerbation as well as poor inspiratory effort due to compression fracture likely contributing.  Patient now requires 5 L of oxygen .  We will admit.  Initiate IV antibiotics and diuresis.  Pain control and addressing her T7 compression fracture.  If patient improves we will discharge home on iron 11  medications.  If no improvement patient would likely need to get a CT angiogram to rule out PE.  #2 T7 compression fractures: Patient has multiple old rib fractures.  Patient may benefit from kyphoplasty.  Will need consultation in the morning to see if she is a good candidate for kyphoplasty.  #3 acute exacerbation of diastolic CHF: Will diurese slowly meanwhile review her echo.  Last echo was in 2023 in the system.  Will order new echocardiogram.  #4 suspected pneumonia: Empiric treatment with IV antibiotics.  Continue to monitor.  #5 essential hypertension: Blood pressure appears controlled.  Will confirm and resume home regimen  #6 hyperlipidemia: Continue home regimen  #7 history of lung cancer: Being followed by oncology.  Patient has had chemo and radiation therapy.  Seems to be in remission.  #8 renal cell carcinoma: Status post nephrectomy.  Patient doing better afterwards.  #9 chronic kidney disease stage IIIa: Appears to be at baseline.  Continue to monitor  #10 hyponatremia: Probably dehydration but could also be SIADH with her history of lung disease.  Monitor sodium.  Will check urine sodium and osmolality.  #11 osteoporosis with multiple bone disease: Will need outpatient treatment.  Patient would likely need to have further bone studies including bone density studies.  #12 hypercalcemia: Most likely related to her compression fractures.  Will monitor calcium  levels.  In the setting of CHF cannot give excess fluids.    Advance Care Planning:   Code Status: Full Code   Consults: Patient likely will need orthopedic consult in the morning for possible kyphoplasty  Family Communication: Daughter at bedside  Severity of Illness: The appropriate patient status for this patient is INPATIENT. Inpatient status is judged to be reasonable and necessary in order to provide the required  intensity of service to ensure the patient's safety. The patient's presenting symptoms, physical  exam findings, and initial radiographic and laboratory data in the context of their chronic comorbidities is felt to place them at high risk for further clinical deterioration. Furthermore, it is not anticipated that the patient will be medically stable for discharge from the hospital within 2 midnights of admission.   * I certify that at the point of admission it is my clinical judgment that the patient will require inpatient hospital care spanning beyond 2 midnights from the point of admission due to high intensity of service, high risk for further deterioration and high frequency of surveillance required.*  AuthorBETHA SIM KNOLL, MD 08/26/2024 10:54 PM  For on call review [email or web address].

## 2024-08-26 NOTE — ED Provider Notes (Signed)
 [place] EMERGENCY DEPARTMENT AT [facility] Provider Note   CSN: [id number] Arrival date & time: 08/26/24  1740     Patient presents with: Shortness of Breath and Back Pain   Tammy Ball is a 83 y.o. female.  {Add pertinent medical, surgical, social history, OB history to HPI:32947} HPI     83yo female with history of COPD on 2L O2, CHF, hypertension, hyperlipidemia,  Normally on 2L of O2, on Saturday bumped to 3, with EMS today was on 5 Was going to sit down and get comfortable on the couch 2 weeks ago and had sudden pain Took tramadol  and tylenol  for a few days and started to get better Then on Thursday, went to lay down on the sofa and the same thing happened again  Had son bring her the following Saturday, this past Saturday , diagnosed with T7 compression fracture, prior t3 t4 chronic, chronic t6  PCP ordered stronger pain medications  Sputum thick and started to get yellow, cough started yesterday Shortness of breath has been ongoing since at least Saturday Feels dyspnea when laying down flat Both feet swelling, since coming home after 10/11 No chest pain Felt feverish 99.2  Not able to sleep well, unsteady on feet Feels like legs weak but not sure if not getting enough O2 Nausea, not vomiting yet but feel like it   Past Medical History:  Diagnosis Date   Anemia    was while doing chemo   Anxiety    Arthritis    Asthma    Chemotherapy-induced neuropathy 11/01/2015   CHF (congestive heart failure) (HCC)    Claustrophobia    COPD (chronic obstructive pulmonary disease) (HCC)    Depression    Encounter for antineoplastic chemotherapy 02/09/2016   GERD (gastroesophageal reflux disease)    Headache    prior to menopause   Heart murmur    History of echocardiogram    Echo 2/17: EF 60-65%, grade 1 diastolic dysfunction, mild MR, trivial pericardial effusion   History of nuclear stress test    Myoview 2/17: no ischemia or scar, EF 79%; low risk    History of radiation therapy 10/30/16-11/06/16   Hyperlipidemia    Hypertension    Insomnia 05/16/2016   Myocardial infarction [facility]    Non-small cell carcinoma of right lung, stage 2 (HCC) 10/03/2015   Oxygen  deficiency    Pneumonia    Radiation 02/29/16-04/10/16   50.4 Gy to right central chest   Renal cell carcinoma (HCC)    L nephrectomy  in 2012     Prior to Admission medications   Medication Sig Start Date End Date Taking? Authorizing Provider  acetaminophen  (TYLENOL ) 500 MG tablet Take 500 mg by mouth every 6 (six) hours as needed for headache or moderate pain (pain).    [provider]  albuterol  (PROVENTIL ) (2.5 MG/3ML) 0.083% nebulizer solution INHALE 3 ML BY NEBULIZATION EVERY 6 HOURS AS NEEDED FOR WHEEZING OR SHORTNESS OF BREATH 04/30/24   Shelah Lamar GORMAN, MD  albuterol  (VENTOLIN  HFA) 108 (90 Base) MCG/ACT inhaler Inhale 1-2 puffs into the lungs every 6 (six) hours as needed for wheezing or shortness of breath. 04/29/24   Shelah Lamar GORMAN, MD  Ascorbic Acid  (VITAMIN C) 1000 MG tablet Take 1,000 mg by mouth every morning.    [provider]  aspirin  EC 81 MG tablet Take 81 mg by mouth every morning.    [provider]  atorvastatin  (LIPITOR) 40 MG tablet TAKE 1 TABLET BY  MOUTH EVERY DAY 02/02/24   [name], Miguel Jose, MD  budesonide -glycopyrrolate -formoterol  (BREZTRI  AEROSPHERE) 160-9-4.8 MCG/ACT AERO inhaler Inhale 2 puffs into the lungs in the morning and at bedtime. 04/29/24   Byrum, Robert S, MD  Calcium  Carbonate Antacid (TUMS PO) Take 1 tablet by mouth 4 (four) times daily as needed (heartburn).    [provider]  Carboxymethylcellulose Sodium (ARTIFICIAL TEARS OP) Place 1 drop into both eyes daily as needed (dry eyes).    [provider]  carvedilol  (COREG ) 25 MG tablet Take 1 tablet (25 mg total) by mouth 2 (two) times daily. 04/15/24   Emelia [name], NP  cholecalciferol  (VITAMIN D3) 25 MCG (1000 UNIT) tablet Take 1,000 Units  by mouth daily.    [provider]  cycloSPORINE (RESTASIS) 0.05 % ophthalmic emulsion  08/02/24   [provider]  diphenhydrAMINE  (BENADRYL ) 25 mg capsule Take 25 mg by mouth at bedtime as needed for allergies.    [provider]  Guaifenesin  (MUCINEX  MAXIMUM STRENGTH) 1200 MG TB12 Take 1,200 mg by mouth every morning.    [provider]  HYDROcodone -acetaminophen  (NORCO/VICODIN) 5-325 MG tablet Take 1 tablet by mouth every 6 (six) hours as needed. 08/25/24   Purcell [name], MD  loperamide  (IMODIUM ) 2 MG capsule Take 1 capsule (2 mg total) by mouth 4 (four) times daily as needed for diarrhea or loose stools. 08/19/23   [name], [name], MD  losartan  (COZAAR ) 100 MG tablet TAKE 1 TABLET BY MOUTH EVERY DAY 02/17/24   Purcell [name], MD  Omega-3 Fatty Acids  (FISH OIL) 1200 MG CAPS Take 1,200 mg by mouth every morning.    [provider]  ondansetron  (ZOFRAN -ODT) 4 MG disintegrating tablet 4mg  ODT q4 hours prn nausea/vomit 08/22/24   Zammit, Joseph, MD  OXYGEN  Inhale 2 L into the lungs as needed (to keep SATS at or above 90).    [provider]  sodium chloride  (OCEAN) 0.65 % SOLN nasal spray Place 1 spray into both nostrils at bedtime as needed for congestion.    [provider]  traMADol  (ULTRAM ) 50 MG tablet Take 1 tablet (50 mg total) by mouth every 6 (six) hours as needed. 08/22/24   Suzette Pac, MD  traZODone  (DESYREL ) 50 MG tablet Take 0.5-1 tablets (25-50 mg total) by mouth at bedtime as needed for sleep. Patient not taking: Reported on 08/25/2024 06/16/24   Purcell [name], MD    Allergies: Bee venom, Amlodipine , Levofloxacin, Alendronate , and Hctz [hydrochlorothiazide ]    Review of Systems  Updated Vital Signs BP (!) 155/90 (BP Location: Left Arm)   Pulse 72   Resp (!) 22   Ht 5' 3 (1.6 m)   Wt 73 kg   SpO2 95%   BMI 28.52 kg/m   Physical Exam  (all labs ordered are listed, but only abnormal  results are displayed) Labs Reviewed  CBC WITH DIFFERENTIAL/PLATELET  COMPREHENSIVE METABOLIC PANEL WITH GFR  URINALYSIS, ROUTINE W REFLEX MICROSCOPIC  PRO BRAIN NATRIURETIC PEPTIDE  CBG MONITORING, ED  TROPONIN T, HIGH SENSITIVITY    EKG: None  Radiology: No results found.  {Document cardiac monitor, telemetry assessment procedure when appropriate:32947} Procedures   Medications Ordered in the ED  fentaNYL  (SUBLIMAZE ) injection 25 mcg (has no administration in time range)      {Click here for ABCD2, HEART and other calculators REFRESH Note before signing:1}  Medical Decision Making Amount and/or Complexity of Data Reviewed Labs: ordered.  Risk Prescription drug management.   ***  {Document critical care time when appropriate  Document review of labs and clinical decision tools ie CHADS2VASC2, etc  Document your independent review of radiology images and any outside records  Document your discussion with family members, caretakers and with consultants  Document social determinants of health affecting pt's care  Document your decision making why or why not admission, treatments were needed:32947:::1}   Final diagnoses:  None    ED Discharge Orders     None

## 2024-08-27 ENCOUNTER — Inpatient Hospital Stay (HOSPITAL_COMMUNITY)

## 2024-08-27 DIAGNOSIS — J9621 Acute and chronic respiratory failure with hypoxia: Secondary | ICD-10-CM | POA: Diagnosis not present

## 2024-08-27 DIAGNOSIS — Z902 Acquired absence of lung [part of]: Secondary | ICD-10-CM

## 2024-08-27 DIAGNOSIS — F329 Major depressive disorder, single episode, unspecified: Secondary | ICD-10-CM

## 2024-08-27 DIAGNOSIS — I5031 Acute diastolic (congestive) heart failure: Secondary | ICD-10-CM | POA: Diagnosis not present

## 2024-08-27 DIAGNOSIS — E871 Hypo-osmolality and hyponatremia: Secondary | ICD-10-CM

## 2024-08-27 DIAGNOSIS — C3491 Malignant neoplasm of unspecified part of right bronchus or lung: Secondary | ICD-10-CM

## 2024-08-27 DIAGNOSIS — I10 Essential (primary) hypertension: Secondary | ICD-10-CM | POA: Diagnosis not present

## 2024-08-27 DIAGNOSIS — M438X4 Other specified deforming dorsopathies, thoracic region: Secondary | ICD-10-CM | POA: Diagnosis not present

## 2024-08-27 DIAGNOSIS — I5032 Chronic diastolic (congestive) heart failure: Secondary | ICD-10-CM

## 2024-08-27 DIAGNOSIS — M40204 Unspecified kyphosis, thoracic region: Secondary | ICD-10-CM | POA: Diagnosis not present

## 2024-08-27 DIAGNOSIS — E782 Mixed hyperlipidemia: Secondary | ICD-10-CM

## 2024-08-27 DIAGNOSIS — C642 Malignant neoplasm of left kidney, except renal pelvis: Secondary | ICD-10-CM

## 2024-08-27 DIAGNOSIS — S22000A Wedge compression fracture of unspecified thoracic vertebra, initial encounter for closed fracture: Secondary | ICD-10-CM | POA: Diagnosis not present

## 2024-08-27 DIAGNOSIS — N1831 Chronic kidney disease, stage 3a: Secondary | ICD-10-CM

## 2024-08-27 LAB — ECHOCARDIOGRAM COMPLETE
AR max vel: 1.84 cm2
AV Area VTI: 1.82 cm2
AV Area mean vel: 1.97 cm2
AV Mean grad: 9.3 mmHg
AV Peak grad: 21 mmHg
Ao pk vel: 2.29 m/s
Area-P 1/2: 4.24 cm2
Calc EF: 74.4 %
Height: 63 in
MV M vel: 4.86 m/s
MV Peak grad: 94.5 mmHg
MV VTI: 2.03 cm2
S' Lateral: 1.8 cm
Single Plane A2C EF: 77.3 %
Single Plane A4C EF: 71.3 %
Weight: 2627.2 [oz_av]

## 2024-08-27 LAB — COMPREHENSIVE METABOLIC PANEL WITH GFR
ALT: 15 U/L (ref 0–44)
AST: 20 U/L (ref 15–41)
Albumin: 3.9 g/dL (ref 3.5–5.0)
Alkaline Phosphatase: 76 U/L (ref 38–126)
Anion gap: 9 (ref 5–15)
BUN: 29 mg/dL — ABNORMAL HIGH (ref 8–23)
CO2: 36 mmol/L — ABNORMAL HIGH (ref 22–32)
Calcium: 9.9 mg/dL (ref 8.9–10.3)
Chloride: 87 mmol/L — ABNORMAL LOW (ref 98–111)
Creatinine, Ser: 1.02 mg/dL — ABNORMAL HIGH (ref 0.44–1.00)
GFR, Estimated: 55 mL/min — ABNORMAL LOW (ref >60)
Glucose, Bld: 123 mg/dL — ABNORMAL HIGH (ref 70–99)
Potassium: 5.3 mmol/L — ABNORMAL HIGH (ref 3.5–5.1)
Sodium: 132 mmol/L — ABNORMAL LOW (ref 135–145)
Total Bilirubin: 0.4 mg/dL (ref 0.0–1.2)
Total Protein: 6.5 g/dL (ref 6.5–8.1)

## 2024-08-27 LAB — CBC
HCT: 39.5 % (ref 36.0–46.0)
HCT: 39.9 % (ref 36.0–46.0)
Hemoglobin: 12.3 g/dL (ref 12.0–15.0)
Hemoglobin: 12.7 g/dL (ref 12.0–15.0)
MCH: 30.9 pg (ref 26.0–34.0)
MCH: 31.8 pg (ref 26.0–34.0)
MCHC: 30.8 g/dL (ref 30.0–36.0)
MCHC: 32.2 g/dL (ref 30.0–36.0)
MCV: 100.3 fL — ABNORMAL HIGH (ref 80.0–100.0)
MCV: 98.8 fL (ref 80.0–100.0)
Platelets: 231 K/uL (ref 150–400)
Platelets: 240 K/uL (ref 150–400)
RBC: 3.98 MIL/uL (ref 3.87–5.11)
RBC: 4 MIL/uL (ref 3.87–5.11)
RDW: 14.7 % (ref 11.5–15.5)
RDW: 14.8 % (ref 11.5–15.5)
WBC: 13.7 K/uL — ABNORMAL HIGH (ref 4.0–10.5)
WBC: 14.2 K/uL — ABNORMAL HIGH (ref 4.0–10.5)
nRBC: 0 % (ref 0.0–0.2)
nRBC: 0 % (ref 0.0–0.2)

## 2024-08-27 LAB — CREATININE, SERUM
Creatinine, Ser: 0.82 mg/dL (ref 0.44–1.00)
GFR, Estimated: >60 mL/min (ref >60)

## 2024-08-27 LAB — SODIUM, URINE, RANDOM: Sodium, Ur: 107 mmol/L

## 2024-08-27 LAB — OSMOLALITY: Osmolality: 280 mosm/kg (ref 275–295)

## 2024-08-27 LAB — OSMOLALITY, URINE: Osmolality, Ur: 333 mosm/kg (ref 300–900)

## 2024-08-27 MED ORDER — OXYCODONE HCL 5 MG PO TABS
5.0000 mg | ORAL_TABLET | ORAL | Status: DC | PRN
  Administered 2024-08-27 – 2024-09-01 (×9): 5 mg via ORAL
  Filled 2024-08-27 (×10): qty 1

## 2024-08-27 MED ORDER — CARVEDILOL 25 MG PO TABS
25.0000 mg | ORAL_TABLET | Freq: Two times a day (BID) | ORAL | Status: DC
  Administered 2024-08-27 – 2024-08-28 (×3): 25 mg via ORAL
  Filled 2024-08-27 (×3): qty 1

## 2024-08-27 MED ORDER — SENNOSIDES-DOCUSATE SODIUM 8.6-50 MG PO TABS
1.0000 | ORAL_TABLET | Freq: Two times a day (BID) | ORAL | Status: DC
  Administered 2024-08-27 – 2024-09-03 (×12): 1 via ORAL
  Filled 2024-08-27 (×14): qty 1

## 2024-08-27 MED ORDER — HYDROMORPHONE HCL 1 MG/ML IJ SOLN
0.5000 mg | Freq: Once | INTRAMUSCULAR | Status: AC
  Administered 2024-08-27: 0.5 mg via INTRAVENOUS
  Filled 2024-08-27: qty 0.5

## 2024-08-27 MED ORDER — POLYETHYLENE GLYCOL 3350 17 G PO PACK
17.0000 g | PACK | Freq: Two times a day (BID) | ORAL | Status: DC
  Administered 2024-08-27 – 2024-09-03 (×8): 17 g via ORAL
  Filled 2024-08-27 (×10): qty 1

## 2024-08-27 MED ORDER — MORPHINE SULFATE (PF) 2 MG/ML IV SOLN: 1.0000 mg | INTRAVENOUS | Status: DC | PRN

## 2024-08-27 NOTE — Plan of Care (Signed)
  Problem: Clinical Measurements: Goal: Diagnostic test results will improve Outcome: Progressing   Problem: Clinical Measurements: Goal: Respiratory complications will improve Outcome: Progressing   Problem: Clinical Measurements: Goal: Cardiovascular complication will be avoided Outcome: Progressing   Problem: Elimination: Goal: Will not experience complications related to urinary retention Outcome: Progressing   Problem: Pain Managment: Goal: General experience of comfort will improve and/or be controlled Outcome: Progressing   Problem: Safety: Goal: Ability to remain free from injury will improve Outcome: Progressing

## 2024-08-27 NOTE — Progress Notes (Addendum)
 PROGRESS NOTE    Tammy Ball  FMW:[id number] DOB: Sep 28, 1941 DOA: 08/26/2024 PCP: Purcell [name], MD   Brief Narrative:  Tammy Ball is a 83 y.o. female with medical history significant of thoracic spinal fracture involving T7 that was recently diagnosed, history of lung cancer status post lobectomy and chemotherapy, history of renal cell carcinoma, chemotherapy induced neuropathy, diastolic dysfunction CHF, COPD with chronic respiratory failure on 2 L of oxygen  at home, depression, essential hypertension, hyperlipidemia, coronary artery disease, radiation induced neuropathy, history of left nephrectomy who has been following up with PCP as well as emergency room after significant back pain that started about 2 weeks ago.  Patiently as above with acute onset back pain, imaging consistent with wedge frac.    Assessment & Plan:   Principal Problem:   Acute on chronic respiratory failure (HCC) Active Problems:   Essential hypertension   COPD GOLD B-C   Renal cell cancer (HCC)   Depression   Hyperlipidemia   Non-small cell carcinoma of right lung, stage 2 (HCC)   S/P lobectomy of lung   Age-related osteoporosis with current pathological fracture   Stage 3a chronic kidney disease (HCC)   Chronic diastolic CHF (congestive heart failure) (HCC)   Hyponatremia   Closed compression fracture of thoracic vertebra (HCC)   Acute on chronic hypoxic respiratory failure, multifactorial, POA:  - Given acuity of hypoxia suspect this is most notably affected by her recent compression fracture -Continue to titrate oxygen  as appropriate, currently requiring 4 to 5 L nasal cannula, on 2 L nasal cannula at home at baseline - Questionable pneumonia versus compression atelectasis given fracture, will treat as below - Concurrent history of heart failure and COPD complicate her respiratory status but again are less likely playing a role given above  Acute versus subacute T7 compression  fracture, POA - Reports no trauma or falls, unclear etiology - Interventional radiology to evaluate for possible kyphoplasty - Pain moderately well-controlled on current regimen including oxycodone  and IV morphine   Acute heart failure exacerbation unlikely  - Repeat echo pending, most recent prior 2023, EF 60 to 65% with mild LV hypertrophy and grade 2 diastolic dysfunction -Continue furosemide    Rule out pneumonia versus compression atelectasis  - Empiric treatment with IV antibiotics in the interim, remains afebrile with mild leukocytosis, low threshold to discontinue antibiotics after 72 hours  Essential hypertension: Currently well-controlled, resume home medications as appropriate, while on narcotics may avoid home regimen to avoid hypotension   Hyperkalemia -likely hemoconcentration, continue IV fluids -on furosemide  as above  Hyperlipidemia: Statin   History of lung cancer: In remission, history of chemo and radiation  Renal cell carcinoma: Status post nephrectomy  Chronic kidney disease stage IIIa: Appears to be at baseline  Hyponatremia: In the setting of hypovolemia, improving with fluids Osteoporosis: Recommend further bone studies including bone density studies/repeat imaging in the outpatient setting as appropriate.  Hypercalcemia: Not clinically relevant -currently back to baseline/within normal limits  DVT prophylaxis: enoxaparin  (LOVENOX ) injection 40 mg Start: 08/27/24 1000 SCDs Start: 08/26/24 2253 Code Status:   Code Status: Full Code Family Communication: None present  Status is: Inpatient  Dispo: The patient is from: Home              Anticipated d/c is to: Home              Anticipated d/c date is: 48 to 72 hours              Patient currently  not medically stable for discharge  Consultants:  Interventional radiology  Procedures:  Potential kyphoplasty in the next 24 to 48 hours  Antimicrobials:  Azithromycin , ceftriaxone  **low threshold to  discontinue after 72 hours given lack of symptoms  Subjective: No acute issues or events overnight, patient continues to complain of shortness of breath and uncontrolled back pain, appears to be improving with new regimen.  Otherwise denies nausea vomiting diarrhea constipation headache fevers chills  Objective: Vitals:   08/26/24 2115 08/26/24 2340 08/26/24 2341 08/27/24 0432  BP: (!) 140/122  138/75 (!) 161/78  Pulse: 80  87 81  Resp: 19  19 18   Temp:   97.6 F (36.4 C) 97.7 F (36.5 C)  TempSrc:   Oral Oral  SpO2: 91%  92% 97%  Weight:  74.5 kg    Height:  5' 3 (1.6 m)      Intake/Output Summary (Last 24 hours) at 08/27/2024 0744 Last data filed at 08/27/2024 0520 Gross per 24 hour  Intake --  Output 700 ml  Net -700 ml   Filed Weights   08/26/24 1814 08/26/24 2340  Weight: 73 kg 74.5 kg    Examination:  General exam: Appears calm and comfortable  Respiratory system: Clear to auscultation. Respiratory effort normal. Cardiovascular system: S1 & S2 heard, RRR. No JVD, murmurs, rubs, gallops or clicks. No pedal edema. Gastrointestinal system: Abdomen is nondistended, soft and nontender. No organomegaly or masses felt. Normal bowel sounds heard. Central nervous system: Alert and oriented. No focal neurological deficits. Extremities: Symmetric 5 x 5 power. Skin: No rashes, lesions or ulcers Psychiatry: Judgement and insight appear normal. Mood & affect appropriate.     Data Reviewed: I have personally reviewed following labs and imaging studies  CBC: Recent Labs  Lab 08/22/24 1559 08/26/24 1906 08/27/24 0005 08/27/24 0633  WBC 8.0 11.6* 13.7* 14.2*  NEUTROABS  --  9.8*  --   --   HGB 12.2 12.7 12.7 12.3  HCT 39.2 39.1 39.5 39.9  MCV 98.2 97.0 98.8 100.3*  PLT 203 236 240 231   Basic Metabolic Panel: Recent Labs  Lab 08/22/24 1559 08/26/24 1906 08/27/24 0005 08/27/24 0633  NA 137 128*  --  132*  K 4.2 5.0  --  5.3*  CL 94* 86*  --  87*  CO2 33* 33*   --  36*  GLUCOSE 79 121*  --  123*  BUN 16 29*  --  29*  CREATININE 0.83 0.87 0.82 1.02*  CALCIUM  10.2 10.4*  --  9.9   GFR: Estimated Creatinine Clearance: 42.5 mL/min (A) (by C-G formula based on SCr of 1.02 mg/dL (H)). Liver Function Tests: Recent Labs  Lab 08/22/24 1559 08/26/24 1906 08/27/24 0633  AST 21 25 20   ALT 13 16 15   ALKPHOS 84 81 76  BILITOT 0.9 0.6 0.4  PROT 6.7 6.7 6.5  ALBUMIN  4.2 4.2 3.9   Recent Labs  Lab 08/22/24 1559  LIPASE 21   BNP (last 3 results) Recent Labs    08/26/24 1906  PROBNP 1,699.0*   CBG: Recent Labs  Lab 08/26/24 2038  GLUCAP 145*    Recent Results (from the past 240 hours)  Blood culture (routine x 2)     Status: None (Preliminary result)   Collection Time: 08/26/24 10:10 PM   Specimen: BLOOD  Result Value Ref Range Status   Specimen Description   Final    BLOOD BLOOD RIGHT ARM Performed at [facility], [address]  [place], [place] [address]    Special Requests   Final    Blood Culture results may not be optimal due to an inadequate volume of blood received in culture bottles BOTTLES DRAWN AEROBIC AND ANAEROBIC Performed at [facility], [address], [place], [place] [address]    Culture   Final    NO GROWTH < 12 HOURS Performed at [facility], [address], [place], [place] [address]    Report Status PENDING  Incomplete  Blood culture (routine x 2)     Status: None (Preliminary result)   Collection Time: 08/26/24 10:15 PM   Specimen: BLOOD  Result Value Ref Range Status   Specimen Description   Final    BLOOD RIGHT ANTECUBITAL Performed at [facility], [address], [place], [place] [address]    Special Requests   Final    Blood Culture results may not be optimal due to an inadequate volume of blood received in culture bottles BOTTLES DRAWN AEROBIC AND ANAEROBIC Performed at [facility], [address]  [place], [place] [address]    Culture   Final    NO GROWTH < 12 HOURS Performed at [facility], [address], [place], [place] [address]    Report Status PENDING  Incomplete         Radiology Studies: DG Chest Portable 1 View Result Date: 08/26/2024 EXAM: 1 VIEW(S) XRAY OF THE CHEST 08/26/2024 08:00:00 PM COMPARISON: CTA chest and portable chest both 08/22/2024. CLINICAL HISTORY: cough, hypoxia. Cough/hypoxia- Pt BIB ems for back pain, nausea, and SOB. Pt was diagnosed with a spinal fracture on Saturday, states they didn't do anything about it, she is still in pain 10/10. States she is having a hard time catching her breath ever since the accident. Hx of CHF, COPD, asthma, MI, HTN, right lung cancer, former smoker. FINDINGS: LUNGS AND PLEURA: There is central vascular prominence and mild interstitial edema in the lung bases with small pleural effusions. Stable appearance of hazy opacities in both lung bases and could indicate atelectasis, ground-glass edema, or pneumonitis. Remaining lungs are clear with mild emphysematous change. No pneumothorax. HEART AND MEDIASTINUM: Stable Cardiomegaly. There is a stable mediastinum with aortic tortuosity and atherosclerosis. BONES AND SOFT TISSUES: Osteopenia and asymmetric advanced arthrosis of the right shoulder. The recent study demonstrated a mild to moderate T7 anterior wedge compression fracture new from 03/16/2024 chest CT, but this is not well reassessed AP. IMPRESSION: 1. Stable cardiomegaly with central vascular prominence and mild interstitial edema in the lung bases with small pleural effusions. 2. Stable hazy bibasilar opacities that may represent atelectasis, edema, or pneumonitis. Overall aeration seems unchanged. 3. Mild to moderate T7 anterior wedge compression fracture, new from 03/16/2024 chest CT noted on recent CTA chest, not well reassessed AP. Electronically signed by: Francis Quam MD 08/26/2024 08:11 PM EDT RP Workstation: [id number]         Scheduled Meds:  enoxaparin  (LOVENOX ) injection  40 mg Subcutaneous Q24H   furosemide   20 mg Intravenous BID   methylPREDNISolone  (SOLU-MEDROL ) injection  125 mg Intravenous Daily   Continuous Infusions:  azithromycin      cefTRIAXone  (ROCEPHIN )  IV       LOS: 1 day    Time spent:    Elsie JAYSON Montclair, DO [facility]  If 7PM-7AM, please contact night-coverage [email or web address]  08/27/2024, 7:44 AM

## 2024-08-28 DIAGNOSIS — F05 Delirium due to known physiological condition: Secondary | ICD-10-CM

## 2024-08-28 DIAGNOSIS — N179 Acute kidney failure, unspecified: Secondary | ICD-10-CM

## 2024-08-28 DIAGNOSIS — M8000XS Age-related osteoporosis with current pathological fracture, unspecified site, sequela: Secondary | ICD-10-CM | POA: Diagnosis not present

## 2024-08-28 DIAGNOSIS — J449 Chronic obstructive pulmonary disease, unspecified: Secondary | ICD-10-CM | POA: Diagnosis not present

## 2024-08-28 DIAGNOSIS — J9621 Acute and chronic respiratory failure with hypoxia: Secondary | ICD-10-CM | POA: Diagnosis not present

## 2024-08-28 DIAGNOSIS — I5032 Chronic diastolic (congestive) heart failure: Secondary | ICD-10-CM | POA: Diagnosis not present

## 2024-08-28 DIAGNOSIS — F329 Major depressive disorder, single episode, unspecified: Secondary | ICD-10-CM | POA: Diagnosis not present

## 2024-08-28 DIAGNOSIS — E875 Hyperkalemia: Secondary | ICD-10-CM

## 2024-08-28 DIAGNOSIS — S22000A Wedge compression fracture of unspecified thoracic vertebra, initial encounter for closed fracture: Secondary | ICD-10-CM | POA: Diagnosis not present

## 2024-08-28 DIAGNOSIS — I10 Essential (primary) hypertension: Secondary | ICD-10-CM | POA: Diagnosis not present

## 2024-08-28 LAB — BASIC METABOLIC PANEL WITH GFR
Anion gap: 9 (ref 5–15)
Anion gap: 8 (ref 5–15)
Anion gap: 8 (ref 5–15)
BUN: 36 mg/dL — ABNORMAL HIGH (ref 8–23)
BUN: 50 mg/dL — ABNORMAL HIGH (ref 8–23)
BUN: 52 mg/dL — ABNORMAL HIGH (ref 8–23)
CO2: 33 mmol/L — ABNORMAL HIGH (ref 22–32)
CO2: 35 mmol/L — ABNORMAL HIGH (ref 22–32)
CO2: 36 mmol/L — ABNORMAL HIGH (ref 22–32)
Calcium: 9.4 mg/dL (ref 8.9–10.3)
Calcium: 9.3 mg/dL (ref 8.9–10.3)
Calcium: 9.1 mg/dL (ref 8.9–10.3)
Chloride: 87 mmol/L — ABNORMAL LOW (ref 98–111)
Chloride: 87 mmol/L — ABNORMAL LOW (ref 98–111)
Chloride: 86 mmol/L — ABNORMAL LOW (ref 98–111)
Creatinine, Ser: 1.61 mg/dL — ABNORMAL HIGH (ref 0.44–1.00)
Creatinine, Ser: 1.9 mg/dL — ABNORMAL HIGH (ref 0.44–1.00)
Creatinine, Ser: 1.63 mg/dL — ABNORMAL HIGH (ref 0.44–1.00)
GFR, Estimated: 32 mL/min — ABNORMAL LOW (ref >60)
GFR, Estimated: 26 mL/min — ABNORMAL LOW (ref >60)
GFR, Estimated: 32 mL/min — ABNORMAL LOW (ref >60)
Glucose, Bld: 121 mg/dL — ABNORMAL HIGH (ref 70–99)
Glucose, Bld: 123 mg/dL — ABNORMAL HIGH (ref 70–99)
Glucose, Bld: 125 mg/dL — ABNORMAL HIGH (ref 70–99)
Potassium: 5.6 mmol/L — ABNORMAL HIGH (ref 3.5–5.1)
Potassium: 6 mmol/L — ABNORMAL HIGH (ref 3.5–5.1)
Potassium: 5.2 mmol/L — ABNORMAL HIGH (ref 3.5–5.1)
Sodium: 129 mmol/L — ABNORMAL LOW (ref 135–145)
Sodium: 131 mmol/L — ABNORMAL LOW (ref 135–145)
Sodium: 130 mmol/L — ABNORMAL LOW (ref 135–145)

## 2024-08-28 LAB — CBC WITH DIFFERENTIAL/PLATELET
Abs Immature Granulocytes: 0.15 K/uL — ABNORMAL HIGH (ref 0.00–0.07)
Basophils Absolute: 0 K/uL (ref 0.0–0.1)
Basophils Relative: 0 %
Eosinophils Absolute: 0 K/uL (ref 0.0–0.5)
Eosinophils Relative: 0 %
HCT: 38.1 % (ref 36.0–46.0)
Hemoglobin: 11.6 g/dL — ABNORMAL LOW (ref 12.0–15.0)
Immature Granulocytes: 1 %
Lymphocytes Relative: 3 %
Lymphs Abs: 0.4 K/uL — ABNORMAL LOW (ref 0.7–4.0)
MCH: 30.6 pg (ref 26.0–34.0)
MCHC: 30.4 g/dL (ref 30.0–36.0)
MCV: 100.5 fL — ABNORMAL HIGH (ref 80.0–100.0)
Monocytes Absolute: 0.6 K/uL (ref 0.1–1.0)
Monocytes Relative: 5 %
Neutro Abs: 10.6 K/uL — ABNORMAL HIGH (ref 1.7–7.7)
Neutrophils Relative %: 91 %
Platelets: 237 K/uL (ref 150–400)
RBC: 3.79 MIL/uL — ABNORMAL LOW (ref 3.87–5.11)
RDW: 14.9 % (ref 11.5–15.5)
WBC: 11.7 K/uL — ABNORMAL HIGH (ref 4.0–10.5)
nRBC: 0.2 % (ref 0.0–0.2)

## 2024-08-28 LAB — PROTIME-INR
INR: 0.9 (ref 0.8–1.2)
Prothrombin Time: 13.1 s (ref 11.4–15.2)

## 2024-08-28 MED ORDER — CALCIUM GLUCONATE-NACL 1-0.675 GM/50ML-% IV SOLN
1.0000 g | Freq: Once | INTRAVENOUS | Status: AC
  Administered 2024-08-28: 1000 mg via INTRAVENOUS
  Filled 2024-08-28: qty 50

## 2024-08-28 MED ORDER — HYDROMORPHONE HCL 1 MG/ML IJ SOLN
0.5000 mg | INTRAMUSCULAR | Status: DC | PRN
  Administered 2024-08-28 – 2024-09-02 (×7): 0.5 mg via INTRAVENOUS
  Filled 2024-08-28: qty 0.5
  Filled 2024-08-28 (×2): qty 1
  Filled 2024-08-28: qty 0.5
  Filled 2024-08-28 (×3): qty 1

## 2024-08-28 MED ORDER — SODIUM CHLORIDE 0.9 % IV SOLN: INTRAVENOUS | Status: DC

## 2024-08-28 MED ORDER — SODIUM ZIRCONIUM CYCLOSILICATE 10 G PO PACK
10.0000 g | PACK | Freq: Once | ORAL | Status: AC
  Administered 2024-08-28: 10 g via ORAL
  Filled 2024-08-28: qty 1

## 2024-08-28 MED ORDER — SODIUM CHLORIDE 0.9 % IV BOLUS
500.0000 mL | Freq: Once | INTRAVENOUS | Status: AC
  Administered 2024-08-28: 500 mL via INTRAVENOUS

## 2024-08-28 MED ORDER — ENOXAPARIN SODIUM 30 MG/0.3ML IJ SOSY
30.0000 mg | PREFILLED_SYRINGE | INTRAMUSCULAR | Status: DC
  Administered 2024-08-28 – 2024-08-30 (×3): 30 mg via SUBCUTANEOUS
  Filled 2024-08-28 (×3): qty 0.3

## 2024-08-28 MED ADMIN — Sodium Zirconium Cyclosilicate For Susp Packet 10 GM: 10 g | ORAL | NDC 00310111001

## 2024-08-28 MED FILL — Sodium Zirconium Cyclosilicate For Susp Packet 10 GM: 10.0000 g | ORAL | Qty: 1 | Status: AC

## 2024-08-28 NOTE — Plan of Care (Signed)
  Problem: Elimination: Goal: Will not experience complications related to bowel motility Outcome: Progressing   Problem: Elimination: Goal: Will not experience complications related to urinary retention Outcome: Progressing

## 2024-08-28 NOTE — Progress Notes (Signed)
 PT Cancellation Note  Patient Details Name: Tammy Ball MRN: [id number] DOB: Dec 01, 1940   Cancelled Treatment:    Reason Eval/Treat Not Completed: Patient's level of consciousness; unable to arouse with multi-modal stim, sternal rub.    [facility] 08/28/2024, 12:29 PM

## 2024-08-28 NOTE — TOC Initial Note (Signed)
 Transition of Care [facility]) - Initial/Assessment Note    Patient Details  Name: Tammy Ball MRN: [id number] Date of Birth: [birth year]  [facility]) CM/SW Contact:    Tawni CHRISTELLA Eva, LCSW Phone Number: 08/28/2024, 8:53 AM  Clinical Narrative:                  Pt is active with [facility]. Care management to follow.   Expected Discharge Plan: Home w Home Health Services Barriers to Discharge: Continued Medical Work up   Patient Goals and CMS Choice            Expected Discharge Plan and Services                                              Prior Living Arrangements/Services                       Activities of Daily Living   ADL Screening (condition at time of admission) Independently performs ADLs?: No Does the patient have a NEW difficulty with bathing/dressing/toileting/self-feeding that is expected to last >3 days?: Yes (Initiates electronic notice to provider for possible OT consult) Does the patient have a NEW difficulty with getting in/out of bed, walking, or climbing stairs that is expected to last >3 days?: Yes (Initiates electronic notice to provider for possible PT consult) Does the patient have a NEW difficulty with communication that is expected to last >3 days?: No Is the patient deaf or have difficulty hearing?: No Does the patient have difficulty seeing, even when wearing glasses/contacts?: No Does the patient have difficulty concentrating, remembering, or making decisions?: No  Permission Sought/Granted                  Emotional Assessment              Admission diagnosis:  Acute on chronic respiratory failure (HCC) [J96.20] Patient Active Problem List   Diagnosis Date Noted   Acute on chronic respiratory failure (HCC) 08/26/2024   Closed compression fracture of thoracic vertebra (HCC) 08/25/2024   Class 1 obesity due to excess calories with serious comorbidity and body mass  index (BMI) of 30.0 to 30.9 in adult 03/20/2023   Iron deficiency anemia 11/15/2022   Internal hemorrhoids 11/15/2022   History of myocardial infarction 09/20/2022   Gastroesophageal reflux disease without esophagitis 06/19/2022   Hyponatremia 06/09/2022   Chronic bilateral low back pain without sciatica 09/06/2021   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) 02/21/2021   Recurrent major depressive disorder, in full remission 02/21/2021   Atherosclerosis of aorta 02/21/2021   Chronic diastolic CHF (congestive heart failure) (HCC)    Stage 3a chronic kidney disease (HCC) 11/10/2020   Chronic respiratory failure with hypoxia (HCC) 12/26/2018   Age-related osteoporosis with current pathological fracture 11/21/2018   Renal insufficiency 05/20/2018   History of compression fracture of spine 12/10/2016   Insomnia 05/16/2016   S/P lobectomy of lung 01/18/2016   Chemotherapy-induced neuropathy 11/01/2015   Non-small cell carcinoma of right lung, stage 2 (HCC) 10/03/2015   Hyperlipidemia 08/14/2013   Spondylolisthesis of lumbar region 07/03/2013   Essential hypertension 12/17/2011   COPD GOLD B-C 12/17/2011   Renal cell cancer (HCC) 12/17/2011   Depression 12/17/2011   PCP:  Purcell [name], MD Pharmacy:  [id number] [place], [place] - [address] [address] Phone: [phone] Fax: [phone]  [facility] [address] [place] [address] Phone: [phone] Fax: [phone]  [id number] [place], [place] - [phone] AT [facility] & [address] [place] [address] Phone: [phone] Fax: [phone]  [facility] [place], [place] - [address] [place] [address] Phone: [phone] Fax: [phone]     Social Drivers of Health (SDOH) Social History: SDOH Screenings   Food Insecurity: No  Food Insecurity (08/27/2024)  Housing: Low Risk  (08/27/2024)  Transportation Needs: No Transportation Needs (08/27/2024)  Recent Concern: Transportation Needs - Unmet Transportation Needs (06/02/2024)  Utilities: Not At Risk (08/27/2024)  Alcohol  Screen: Low Risk  (06/02/2024)  Depression (PHQ2-9): Medium Risk (08/25/2024)  Financial Resource Strain: Medium Risk (06/02/2024)  Physical Activity: Inactive (06/02/2024)  Social Connections: Socially Isolated (08/27/2024)  Stress: No Stress Concern Present (06/02/2024)  Tobacco Use: Medium Risk (08/26/2024)  Health Literacy: Adequate Health Literacy (06/02/2024)   SDOH Interventions:     Readmission Risk Interventions    06/11/2022   12:06 PM  Readmission Risk Prevention Plan  Transportation Screening Complete  PCP or Specialist Appt within 3-5 Days Complete  HRI or Home Care Consult Complete  Social Work Consult for Recovery Care Planning/Counseling Complete  Palliative Care Screening Not Applicable  Medication Review Oceanographer) Complete

## 2024-08-28 NOTE — Progress Notes (Signed)
 OT Cancellation Note  Patient Details Name: Tammy Ball MRN: [id number] DOB: 03-27-1941   Cancelled Treatment:    Reason Eval/Treat Not Completed: Fatigue/lethargy limiting ability to participate;Patient's level of consciousness  OT attempted initial evaluation. Patient unable to arouse this afternoon with nursing confirming patient has been given pain meds earlier causing this response. Will continue to follow acutely as patient able to participate.   Kanesha Cadle OT/L Acute Rehabilitation Department  [phone]  08/28/2024, 1:47 PM

## 2024-08-28 NOTE — Progress Notes (Addendum)
 PROGRESS NOTE    Tammy Ball  FMW:[id number] DOB: [birth year] DOA: 08/26/2024 PCP: Purcell [name], MD   Brief Narrative:  Tammy Ball is a 83 y.o. female with medical history significant of thoracic spinal fracture involving T7 that was recently diagnosed, history of lung cancer status post lobectomy and chemotherapy, history of renal cell carcinoma, chemotherapy induced neuropathy, diastolic dysfunction CHF, COPD with chronic respiratory failure on 2 L of oxygen  at home, depression, essential hypertension, hyperlipidemia, coronary artery disease, radiation induced neuropathy, history of left nephrectomy who has been following up with PCP as well as emergency room after significant back pain that started about 2 weeks ago. Patiently as above with acute onset back pain, imaging consistent with wedge fracture, hospitalist called for admission.  Assessment & Plan: Principal Problem:   Acute on chronic respiratory failure (HCC) Active Problems:   Essential hypertension   COPD GOLD B-C   Renal cell cancer (HCC)   Depression   Hyperlipidemia   Non-small cell carcinoma of right lung, stage 2 (HCC)   S/P lobectomy of lung   Age-related osteoporosis with current pathological fracture   Stage 3a chronic kidney disease (HCC)   Chronic diastolic CHF (congestive heart failure) (HCC)   Hyponatremia   Closed compression fracture of thoracic vertebra (HCC)  Acute on chronic hypoxic respiratory failure, multifactorial, POA:  - Given acuity of hypoxia suspect this is most notably affected by her recent compression fracture - Continue to titrate oxygen  as appropriate, currently requiring 7L nasal cannula to maintain sats while splinting (on 2 L nasal cannula at home at baseline) - Likely compression atelectasis given fracture - Concurrent history of heart failure and COPD complicate her respiratory status but again are less likely playing a role given above with no signs/symptoms of  exacerbation  Hyperkalemia - Lokelma x2 today - follow repeat labs;   Acute versus subacute T7 compression fracture, POA - Reports no trauma or falls, unclear etiology - Interventional radiology to evaluate for possible kyphoplasty - scheduled for 08/31/24 - Pain moderately well-controlled on current regimen including oxycodone  and IV dilaudid  but continues to have profound hypoxia from baseline due to pain  Transient mental status changes, likely delirium in the setting of narcotics - Now Aox4 - has moments of confusion but generally improving - Hold IV narcotics unless absolutely necessary - Family indicates patient poorly tolerates morphine  - no improvement with IV dilaudid   Hypotension - Likely multifactorial in setting of poor p.o. intake, narcotics and ongoing home regimen - Carvedilol  discontinued, 500 cc bolus NS this afternoon, follow closely given heart failure diagnosis as below.  Patient appears hypovolemic if anything.  Acute heart failure exacerbation unlikely  - Repeat echo pending, most recent prior 2023, EF 60 to 65% with mild LV hypertrophy and grade 2 diastolic dysfunction -Hold furosemide  given worsening hyponatremia/elevated creatinine  Rule out pneumonia versus compression atelectasis  - Empiric treatment with IV antibiotics completed - no fevers, cough, sputum production with only minimally elevated WBC in the setting of acute stress/pain.  Hyperlipidemia: Statin   Essential hypertension: Continue carvedilol ; hold other home medications - BP well controlled while on narcotics may avoid home regimen to avoid hypotension  History of lung cancer: In remission, history of chemo and radiation  Renal cell carcinoma: Status post nephrectomy  Chronic kidney disease stage IIIa: Appears to be near baseline  Hyponatremia: In the setting of hypovolemia, improving with fluids Osteoporosis: Recommend further bone studies including bone density studies/repeat imaging in the  outpatient setting  as appropriate.  Hypercalcemia: Not clinically relevant -currently back to baseline/within normal limits  DVT prophylaxis: enoxaparin  (LOVENOX ) injection 40 mg Start: 08/27/24 1000 SCDs Start: 08/26/24 2253 Code Status:   Code Status: Full Code Family Communication: None present  Status is: Inpatient  Dispo: The patient is from: Home              Anticipated d/c is to: Home              Anticipated d/c date is: 48 to 72 hours              Patient currently not medically stable for discharge  Consultants:  Interventional radiology  Procedures:  Potential kyphoplasty in the next 24 to 48 hours  Antimicrobials:  Azithromycin , ceftriaxone  **low threshold to discontinue after 72 hours given lack of symptoms  Subjective: No acute issues or events overnight, patient continues to complain of shortness of breath and uncontrolled back pain, appears to be improving with new regimen.  Otherwise denies nausea vomiting diarrhea constipation headache fevers chills  Objective: Vitals:   08/27/24 1202 08/27/24 1219 08/27/24 1922 08/28/24 0517  BP:  105/62 (!) 113/59 118/64  Pulse:  70 71 75  Resp:  18    Temp:  98.1 F (36.7 C) 98.3 F (36.8 C) 98.5 F (36.9 C)  TempSrc:  Oral Oral Oral  SpO2: 97% 99% 99% 97%  Weight:      Height:        Intake/Output Summary (Last 24 hours) at 08/28/2024 0825 Last data filed at 08/28/2024 0500 Gross per 24 hour  Intake 590 ml  Output 500 ml  Net 90 ml   Filed Weights   08/26/24 1814 08/26/24 2340  Weight: 73 kg 74.5 kg    Examination:  General exam: Appears calm and comfortable  Respiratory system: Clear to auscultation. Respiratory effort normal. Cardiovascular system: S1 & S2 heard, RRR. No JVD, murmurs, rubs, gallops or clicks. No pedal edema. Gastrointestinal system: Abdomen is nondistended, soft and nontender. No organomegaly or masses felt. Normal bowel sounds heard. Central nervous system: Alert and oriented. No  focal neurological deficits. Extremities: Symmetric 5 x 5 power. Skin: No rashes, lesions or ulcers Psychiatry: Judgement and insight appear normal. Mood & affect appropriate.     Data Reviewed: I have personally reviewed following labs and imaging studies  CBC: Recent Labs  Lab 08/22/24 1559 08/26/24 1906 08/27/24 0005 08/27/24 0633 08/28/24 0412  WBC 8.0 11.6* 13.7* 14.2* 11.7*  NEUTROABS  --  9.8*  --   --  10.6*  HGB 12.2 12.7 12.7 12.3 11.6*  HCT 39.2 39.1 39.5 39.9 38.1  MCV 98.2 97.0 98.8 100.3* 100.5*  PLT 203 236 240 231 237   Basic Metabolic Panel: Recent Labs  Lab 08/22/24 1559 08/26/24 1906 08/27/24 0005 08/27/24 0633 08/28/24 0412  NA 137 128*  --  132* 129*  K 4.2 5.0  --  5.3* 5.6*  CL 94* 86*  --  87* 87*  CO2 33* 33*  --  36* 33*  GLUCOSE 79 121*  --  123* 121*  BUN 16 29*  --  29* 36*  CREATININE 0.83 0.87 0.82 1.02* 1.61*  CALCIUM  10.2 10.4*  --  9.9 9.4   GFR: Estimated Creatinine Clearance: 26.9 mL/min (A) (by C-G formula based on SCr of 1.61 mg/dL (H)). Liver Function Tests: Recent Labs  Lab 08/22/24 1559 08/26/24 1906 08/27/24 0633  AST 21 25 20   ALT 13 16 15   ALKPHOS 84  81 76  BILITOT 0.9 0.6 0.4  PROT 6.7 6.7 6.5  ALBUMIN  4.2 4.2 3.9   Recent Labs  Lab 08/22/24 1559  LIPASE 21   BNP (last 3 results) Recent Labs    08/26/24 1906  PROBNP 1,699.0*   CBG: Recent Labs  Lab 08/26/24 2038  GLUCAP 145*    Recent Results (from the past 240 hours)  Blood culture (routine x 2)     Status: None (Preliminary result)   Collection Time: 08/26/24 10:10 PM   Specimen: BLOOD RIGHT ARM  Result Value Ref Range Status   Specimen Description   Final    BLOOD RIGHT ARM Performed at [facility], [address] [place], [place] [address]    Special Requests   Final    Blood Culture results may not be optimal due to an inadequate volume of blood received in culture bottles BOTTLES DRAWN AEROBIC AND ANAEROBIC Performed at  [facility], [address] [place], [place] [address]    Culture   Final    NO GROWTH 1 DAY Performed at [facility], [address] [place], [place] [address]    Report Status PENDING  Incomplete  Blood culture (routine x 2)     Status: None (Preliminary result)   Collection Time: 08/26/24 10:15 PM   Specimen: BLOOD  Result Value Ref Range Status   Specimen Description   Final    BLOOD RIGHT ANTECUBITAL Performed at [facility], [address] [place], [place] [address]    Special Requests   Final    Blood Culture results may not be optimal due to an inadequate volume of blood received in culture bottles BOTTLES DRAWN AEROBIC AND ANAEROBIC Performed at [facility], [address], [place], [place] [address]    Culture   Final    NO GROWTH 1 DAY Performed at [facility], [address], [place], [place] [address]    Report Status PENDING  Incomplete         Radiology Studies: MR THORACIC SPINE WO CONTRAST Result Date: 08/28/2024 EXAM: MRI THORACIC SPINE WITHOUT INTRAVENOUS CONTRAST 08/27/2024 07:11:45 PM TECHNIQUE: Multiplanar multisequence MRI of the thoracic spine was performed without the administration of intravenous contrast. COMPARISON: MRI of the thoracic spine dated 12/21/2016 and CT of the thoracic spine dated 08/22/2024. CLINICAL HISTORY: Compression fracture, thoracic. FINDINGS: BONES AND ALIGNMENT: Normal alignment except for focal kyphosis at T4. There is a mild-to-moderate compression deformity of T7. Increased T2 signal is present throughout the T7 vertebral body, indicating the fracture is likely acute or subacute. Bone marrow edema is present anteriorly within the T5 and T6 vertebral bodies. There are chronic compression deformities of T3 and T4, which have been treated with augmentation. The T4 vertebral body has lost approximately 75% of its height anteriorly, contributing to focal  kyphosis as before. Um Bone marrow signal is unremarkable except for the findings described above. The patient is status post ACDF at C6-C7. SPINAL CORD: Normal spinal cord volume. Normal spinal cord signal. SOFT TISSUES: Bilateral pleural effusions, right larger than left. There is airspace consolidation within the left upper lobe. Other visualized soft tissues are unremarkable. DEGENERATIVE CHANGES: No significant disc herniation. No spinal canal stenosis or neural foraminal narrowing. IMPRESSION: 1. Mild-to-moderate compression deformity of T7 with increased T2 signal, likely acute or subacute. 2. Bone marrow edema anteriorly within the T5 and T6 vertebral bodies. 3. Chronic compression deformities of T3 and T4,  treated with augmentation, with focal kyphosis at T4 as before. 4. Bilateral pleural effusions, right larger than left. 5. Airspace consolidation within the left upper lobe. Electronically signed by: Evalene Coho MD 08/28/2024 04:42 AM EDT RP Workstation: [id number]   ECHOCARDIOGRAM COMPLETE Result Date: 08/27/2024    ECHOCARDIOGRAM REPORT   Patient Name:   Tammy Ball Date of Exam: 08/27/2024 Medical Rec #:  [id number]      Height:       63.0 in Accession #:    [id number]     Weight:       164.2 lb Date of Birth:  1941/05/15      BSA:          1.778 m Patient Age:    82 years       BP:           161/78 mmHg Patient Gender: F              HR:           84 bpm. Exam Location:  Inpatient Procedure: 2D Echo, Cardiac Doppler and Color Doppler (Both Spectral and Color            Flow Doppler were utilized during procedure). Indications:    CHF I50.31  History:        Patient has prior history of Echocardiogram examinations, most                 recent 06/10/2022. CHF, Previous Myocardial Infarction, COPD;                 Risk Factors:Hypertension. H/O Renal cell cancer.  Sonographer:    BERNARDA ROCKS Referring Phys: [id number] MOHAMMAD L GARBA IMPRESSIONS  1. Left ventricular ejection fraction, by  estimation, is 60 to 65%. The left ventricle has normal function. The left ventricle has no regional wall motion abnormalities. There is mild concentric left ventricular hypertrophy. Left ventricular diastolic parameters are consistent with Grade II diastolic dysfunction (pseudonormalization). Elevated left atrial pressure.  2. Right ventricular systolic function is normal. The right ventricular size is normal. There is moderately elevated pulmonary artery systolic pressure.  3. The mitral valve is normal in structure. Mild mitral valve regurgitation. No evidence of mitral stenosis.  4. The aortic valve is calcified. There is moderate calcification of the aortic valve. Aortic valve regurgitation is not visualized. Mild aortic valve stenosis.  5. Abdominal aorta is normal size.  6. The inferior vena cava is normal in size with greater than 50% respiratory variability, suggesting right atrial pressure of 3 mmHg. FINDINGS  Left Ventricle: Left ventricular ejection fraction, by estimation, is 60 to 65%. The left ventricle has normal function. The left ventricle has no regional wall motion abnormalities. The left ventricular internal cavity size was normal in size. There is  mild concentric left ventricular hypertrophy. Left ventricular diastolic parameters are consistent with Grade II diastolic dysfunction (pseudonormalization). Elevated left atrial pressure. Right Ventricle: The right ventricular size is normal. No increase in right ventricular wall thickness. Right ventricular systolic function is normal. There is moderately elevated pulmonary artery systolic pressure. The tricuspid regurgitant velocity is 3.61 m/s, and with an assumed right atrial pressure of 3 mmHg, the estimated right ventricular systolic pressure is 55.1 mmHg. Left Atrium: Left atrial size was normal in size. Right Atrium: Right atrial size was normal in size. Pericardium: Trivial pericardial effusion is present. The pericardial effusion is  circumferential. Mitral Valve: The mitral valve is normal in structure. Mild mitral valve  regurgitation. No evidence of mitral valve stenosis. MV peak gradient, 7.7 mmHg. The mean mitral valve gradient is 3.0 mmHg. Tricuspid Valve: The tricuspid valve is normal in structure. Tricuspid valve regurgitation is not demonstrated. No evidence of tricuspid stenosis. Aortic Valve: The aortic valve is calcified. There is moderate calcification of the aortic valve. There is moderate aortic valve annular calcification. Aortic valve regurgitation is not visualized. Mild aortic stenosis is present. Aortic valve mean gradient measures 9.3 mmHg. Aortic valve peak gradient measures 21.0 mmHg. Aortic valve area, by VTI measures 1.82 cm. Pulmonic Valve: The pulmonic valve was normal in structure. Pulmonic valve regurgitation is not visualized. No evidence of pulmonic stenosis. Aorta: Abdominal aorta is normal size. The aortic root is normal in size and structure. Venous: The inferior vena cava is normal in size with greater than 50% respiratory variability, suggesting right atrial pressure of 3 mmHg. IAS/Shunts: No atrial level shunt detected by color flow Doppler.  LEFT VENTRICLE PLAX 2D LVIDd:         3.00 cm     Diastology LVIDs:         1.80 cm     LV e' medial:    5.22 cm/s LV PW:         1.10 cm     LV E/e' medial:  20.5 LV IVS:        1.20 cm     LV e' lateral:   7.51 cm/s LVOT diam:     1.80 cm     LV E/e' lateral: 14.2 LV SV:         76 LV SV Index:   43 LVOT Area:     2.54 cm  LV Volumes (MOD) LV vol d, MOD A2C: 72.1 ml LV vol d, MOD A4C: 73.2 ml LV vol s, MOD A2C: 16.4 ml LV vol s, MOD A4C: 21.0 ml LV SV MOD A2C:     55.7 ml LV SV MOD A4C:     73.2 ml LV SV MOD BP:      54.7 ml RIGHT VENTRICLE             IVC RV Basal diam:  3.10 cm     IVC diam: 1.60 cm RV S prime:     11.90 cm/s TAPSE (M-mode): 2.5 cm RVSP:           55.1 mmHg LEFT ATRIUM             Index        RIGHT ATRIUM           Index LA diam:        2.20 cm  1.24 cm/m   RA Pressure: 3.00 mmHg LA Vol (A2C):   70.7 ml 39.76 ml/m  RA Area:     13.10 cm LA Vol (A4C):   21.1 ml 11.87 ml/m  RA Volume:   24.90 ml  14.00 ml/m LA Biplane Vol: 39.2 ml 22.05 ml/m  AORTIC VALVE                     PULMONIC VALVE AV Area (Vmax):    1.84 cm      PV Vmax:       1.24 m/s AV Area (Vmean):   1.97 cm      PV Peak grad:  6.2 mmHg AV Area (VTI):     1.82 cm AV Vmax:           229.00 cm/s AV Vmean:  141.000 cm/s AV VTI:            0.417 m AV Peak Grad:      21.0 mmHg AV Mean Grad:      9.3 mmHg LVOT Vmax:         166.00 cm/s LVOT Vmean:        109.000 cm/s LVOT VTI:          0.299 m LVOT/AV VTI ratio: 0.72  AORTA Ao Root diam: 3.00 cm Ao Asc diam:  3.30 cm MITRAL VALVE                TRICUSPID VALVE MV Area (PHT): 4.24 cm     TR Peak grad:   52.1 mmHg MV Area VTI:   2.03 cm     TR Vmax:        361.00 cm/s MV Peak grad:  7.7 mmHg     Estimated RAP:  3.00 mmHg MV Mean grad:  3.0 mmHg     RVSP:           55.1 mmHg MV Vmax:       1.39 m/s MV Vmean:      73.1 cm/s    SHUNTS MV Decel Time: 179 msec     Systemic VTI:  0.30 m MR Peak grad: 94.5 mmHg     Systemic Diam: 1.80 cm MR Vmax:      486.00 cm/s MV E velocity: 107.00 cm/s MV A velocity: 106.00 cm/s MV E/A ratio:  1.01 Kardie Tobb DO Electronically signed by Dub Huntsman DO Signature Date/Time: 08/27/2024/11:41:15 AM    Final    DG Chest Portable 1 View Result Date: 08/26/2024 EXAM: 1 VIEW(S) XRAY OF THE CHEST 08/26/2024 08:00:00 PM COMPARISON: CTA chest and portable chest both 08/22/2024. CLINICAL HISTORY: cough, hypoxia. Cough/hypoxia- Pt BIB ems for back pain, nausea, and SOB. Pt was diagnosed with a spinal fracture on Saturday, states they didn't do anything about it, she is still in pain 10/10. States she is having a hard time catching her breath ever since the accident. Hx of CHF, COPD, asthma, MI, HTN, right lung cancer, former smoker. FINDINGS: LUNGS AND PLEURA: There is central vascular prominence and mild  interstitial edema in the lung bases with small pleural effusions. Stable appearance of hazy opacities in both lung bases and could indicate atelectasis, ground-glass edema, or pneumonitis. Remaining lungs are clear with mild emphysematous change. No pneumothorax. HEART AND MEDIASTINUM: Stable Cardiomegaly. There is a stable mediastinum with aortic tortuosity and atherosclerosis. BONES AND SOFT TISSUES: Osteopenia and asymmetric advanced arthrosis of the right shoulder. The recent study demonstrated a mild to moderate T7 anterior wedge compression fracture new from 03/16/2024 chest CT, but this is not well reassessed AP. IMPRESSION: 1. Stable cardiomegaly with central vascular prominence and mild interstitial edema in the lung bases with small pleural effusions. 2. Stable hazy bibasilar opacities that may represent atelectasis, edema, or pneumonitis. Overall aeration seems unchanged. 3. Mild to moderate T7 anterior wedge compression fracture, new from 03/16/2024 chest CT noted on recent CTA chest, not well reassessed AP. Electronically signed by: Francis Quam MD 08/26/2024 08:11 PM EDT RP Workstation: [id number]        Scheduled Meds:  carvedilol   25 mg Oral BID WC   enoxaparin  (LOVENOX ) injection  40 mg Subcutaneous Q24H   furosemide   20 mg Intravenous BID   methylPREDNISolone  (SOLU-MEDROL ) injection  125 mg Intravenous Daily   polyethylene glycol  17 g Oral BID   senna-docusate  1  tablet Oral BID   Continuous Infusions:  azithromycin  500 mg (08/28/24 0050)   cefTRIAXone  (ROCEPHIN )  IV 1 g (08/27/24 2343)     LOS: 2 days    Time spent:    Elsie JAYSON Montclair, DO [facility]  If 7PM-7AM, please contact night-coverage [email or web address]  08/28/2024, 8:25 AM

## 2024-08-28 NOTE — Progress Notes (Signed)
   08/28/24 1324  Assess: MEWS Score  Temp 97.6 F (36.4 C)  BP (!) 75/62  MAP (mmHg) 67  Pulse Rate 72  Resp 18  Level of Consciousness Responds to Voice  SpO2 100 %  O2 Device Nasal Cannula  O2 Flow Rate (L/min) 7 L/min  Assess: MEWS Score  MEWS Temp 0  MEWS Systolic 2  MEWS Pulse 0  MEWS RR 0  MEWS LOC 1  MEWS Score 3  MEWS Score Color Yellow  Assess: if the MEWS score is Yellow or Red  Were vital signs accurate and taken at a resting state? Yes  Does the patient meet 2 or more of the SIRS criteria? No  Assess: SIRS CRITERIA  SIRS Temperature  0  SIRS Respirations  0  SIRS Pulse 0  SIRS WBC 0  SIRS Score Sum  0   YELLOW MEWS protocol initiated. MD, CN, and RR nurse are aware. Please note patient is not in distress and was asleep during VS. Additionally, patient had previously taken BP and PRN pain medication. Will continue to monitor.

## 2024-08-28 NOTE — Consult Note (Signed)
 Chief Complaint: Patient was seen in consultation today for T7 compression fracture  Referring Physician(s): Dr. Elsie Montclair  Supervising Physician: Hughes Simmonds  Patient Status: [facility] - In-pt  History of Present Illness: Tammy Ball is a 83 y.o. female with history of COPD, prior stage II lung cancer s/p radiation therapy, prior MI now with HFpEF, HTN, known to IR from prior T4, T5 kyphoplasty with Dr. Dolphus in 2018 who returns to the [facility] ED with acute onset back pain.  CT Abdomen Pelvis, L-spine, T-spine performed yesterday showing a new mild compression fracture at T7. IR consulted for possible vertebroplasty/kyphoplasty.  Additional imaging requested and an MR Thoracic Spine performed last night confirms fracture is acute.  Case reviewed and approved by Dr. Hughes.   APP to bedside for patient assessment this afternoon.  She is lethargic, calling out in her sleep, and does not easily arouse after recent pain med was given.  She is on 7L [place] which is a significant change from her baseline of 2L [place]. She is requiring IV pain medication for intractable back pain rated at 9-10/10 on admission.  She is unable to discharge home without improvement or adequate pain control.   Patient is  FULL CODE.  Past Medical History:  Diagnosis Date   Anemia    was while doing chemo   Anxiety    Arthritis    Asthma    Chemotherapy-induced neuropathy 11/01/2015   CHF (congestive heart failure) (HCC)    Claustrophobia    COPD (chronic obstructive pulmonary disease) (HCC)    Depression    Encounter for antineoplastic chemotherapy 02/09/2016   GERD (gastroesophageal reflux disease)    Headache    prior to menopause   Heart murmur    History of echocardiogram    Echo 2/17: EF 60-65%, grade 1 diastolic dysfunction, mild MR, trivial pericardial effusion   History of nuclear stress test    Myoview 2/17: no ischemia or scar, EF 79%; low risk   History of radiation therapy  10/30/16-11/06/16   Hyperlipidemia    Hypertension    Insomnia 05/16/2016   Myocardial infarction (HCC)    Non-small cell carcinoma of right lung, stage 2 (HCC) 10/03/2015   Oxygen  deficiency    Pneumonia    Radiation 02/29/16-04/10/16   50.4 Gy to right central chest   Renal cell carcinoma (HCC)    L nephrectomy  in 2012    Past Surgical History:  Procedure Laterality Date   BACK SURGERY     cervical 1991   BIOPSY  11/15/2022   Procedure: BIOPSY;  Surgeon: San Sandor GAILS, DO;  Location: [facility];  Service: Gastroenterology;;   COLONOSCOPY WITH PROPOFOL  N/A 11/15/2022   Procedure: COLONOSCOPY WITH PROPOFOL ;  Surgeon: San Sandor GAILS, DO;  Location: [facility];  Service: Gastroenterology;  Laterality: N/A;   ESOPHAGOGASTRODUODENOSCOPY (EGD) WITH PROPOFOL  N/A 11/15/2022   Procedure: ESOPHAGOGASTRODUODENOSCOPY (EGD) WITH PROPOFOL ;  Surgeon: San Sandor GAILS, DO;  Location: [facility];  Service: Gastroenterology;  Laterality: N/A;   EYE SURGERY     IR GENERIC HISTORICAL  01/16/2017   IR RADIOLOGIST EVAL & MGMT 01/16/2017 [facility]   IR GENERIC HISTORICAL  01/31/2017   IR FLUORO GUIDED NEEDLE PLC ASPIRATION/INJECTION LOC 01/31/2017 Thyra Dolphus, MD [facility]   IR GENERIC HISTORICAL  01/31/2017   IR VERTEBROPLASTY CERV/THOR BX INC UNI/BIL INC/INJECT/IMAGING 01/31/2017 Thyra Dolphus, MD [facility]   IR GENERIC HISTORICAL  01/31/2017   IR VERTEBROPLASTY EA ADDL (T&LS) BX INC UNI/BIL INC  INJECT/IMAGING 01/31/2017 [name], MD [facility]   kidney cancer     LOBECTOMY Right    RUL   NEPHRECTOMY  2012   POLYPECTOMY  11/15/2022   Procedure: POLYPECTOMY;  Surgeon: San Sandor GAILS, DO;  Location: [facility];  Service: Gastroenterology;;   SPINE SURGERY     TUBAL LIGATION     VIDEO ASSISTED THORACOSCOPY (VATS)/WEDGE RESECTION Right 01/18/2016   Procedure: VIDEO ASSISTED THORACOSCOPY (VATS)/LUNG RESECTION, THOROCOTOMY, RIGHT UPPER LOBECTOMY, LYMPH NODE  DISSECTION, PLACEMENT OF ON Q;  Surgeon: [name], MD;  Location: [facility] OR;  Service: Thoracic;  Laterality: Right;   VIDEO BRONCHOSCOPY Bilateral 09/20/2015   Procedure: VIDEO BRONCHOSCOPY WITHOUT FLUORO;  Surgeon: [name], MD;  Location: [facility];  Service: Cardiopulmonary;  Laterality: Bilateral;   VIDEO BRONCHOSCOPY N/A 01/18/2016   Procedure: VIDEO BRONCHOSCOPY;  Surgeon: [name], MD;  Location[facility];  Service: Thoracic;  Laterality: N/A;    Allergies: Bee venom, Amlodipine , Levofloxacin, Alendronate , and Hctz Artemisius.Been ]  Medications: Prior to Admission medications   Medication Sig Start Date End Date Taking? Authorizing Provider  acetaminophen  (TYLENOL ) 500 MG tablet Take 500 mg by mouth every 6 (six) hours as needed for headache or moderate pain (pain).   Yes [provider]  albuterol  (PROVENTIL ) (2.5 MG/3ML) 0.083% nebulizer solution INHALE 3 ML BY NEBULIZATION EVERY 6 HOURS AS NEEDED FOR WHEEZING OR SHORTNESS OF BREATH 04/30/24  Yes Byrum, Lamar RAMAN, MD  albuterol  (VENTOLIN  HFA) 108 (90 Base) MCG/ACT inhaler Inhale 1-2 puffs into the lungs every 6 (six) hours as needed for wheezing or shortness of breath. 04/29/24  Yes [name], MD  Ascorbic Acid  (VITAMIN C) 1000 MG tablet Take 1,000 mg by mouth every morning.   Yes [provider]  aspirin  EC 81 MG tablet Take 81 mg by mouth every morning.   Yes [provider]  atorvastatin  (LIPITOR) 40 MG tablet TAKE 1 TABLET BY MOUTH EVERY DAY 02/02/24  Yes [name], Miguel Jose, MD  budesonide -glycopyrrolate -formoterol  (BREZTRI  AEROSPHERE) 160-9-4.8 MCG/ACT AERO inhaler Inhale 2 puffs into the lungs in the morning and at bedtime. 04/29/24  Yes [name], MD  Calcium  Carbonate Antacid (TUMS PO) Take 1 tablet by mouth 4 (four) times daily as needed (heartburn).   Yes [provider]  Carboxymethylcellulose Sodium (ARTIFICIAL TEARS OP) Place 1 drop into both eyes daily  as needed (dry eyes).   Yes [provider]  carvedilol  (COREG ) 25 MG tablet Take 1 tablet (25 mg total) by mouth 2 (two) times daily. 04/15/24  Yes [name], [name], NP  cholecalciferol  (VITAMIN D3) 25 MCG (1000 UNIT) tablet Take 1,000 Units by mouth daily.   Yes [provider]  cycloSPORINE (RESTASIS) 0.05 % ophthalmic emulsion Place 1 drop into both eyes See admin instructions. Every 2 hours 08/02/24  Yes [provider]  Guaifenesin  (MUCINEX  MAXIMUM STRENGTH) 1200 MG TB12 Take 1,200 mg by mouth every morning.   Yes [provider]  HYDROcodone -acetaminophen  (NORCO/VICODIN) 5-325 MG tablet Take 1 tablet by mouth every 6 (six) hours as needed. 08/25/24  Yes [name], [name], MD  loperamide  (IMODIUM ) 2 MG capsule Take 1 capsule (2 mg total) by mouth 4 (four) times daily as needed for diarrhea or loose stools. 08/19/23  Yes [name], [name], MD  losartan  (COZAAR ) 100 MG tablet TAKE 1 TABLET BY MOUTH EVERY DAY 02/17/24  Yes [name], [name], MD  Omega-3 Fatty Acids  (FISH OIL) 1200 MG CAPS Take 1,200 mg by mouth  every morning.   Yes [provider]  ondansetron  (ZOFRAN -ODT) 4 MG disintegrating tablet 4mg  ODT q4 hours prn nausea/vomit 08/22/24  Yes Zammit, Joseph, MD  OXYGEN  Inhale 2 L into the lungs as needed (to keep SATS at or above 90).   Yes [provider]  sodium chloride  (OCEAN) 0.65 % SOLN nasal spray Place 1 spray into both nostrils at bedtime as needed for congestion.   Yes [provider]  traMADol  (ULTRAM ) 50 MG tablet Take 1 tablet (50 mg total) by mouth every 6 (six) hours as needed. 08/22/24  Yes Suzette Pac, MD  traZODone  (DESYREL ) 50 MG tablet Take 0.5-1 tablets (25-50 mg total) by mouth at bedtime as needed for sleep. 06/16/24  Yes Purcell [name], MD  diphenhydrAMINE  (BENADRYL ) 25 mg capsule Take 25 mg by mouth at bedtime as needed for allergies. Patient not taking: Reported on 08/26/2024    [provider]     Family History  Problem Relation Age of Onset   Cancer Mother        Bladder Cancer   Hypertension Mother    CAD Mother 46   Heart disease Sister    Breast cancer Sister    Birth defects Sister    Obesity Brother    Cancer Maternal Grandmother    Heart attack Daughter 73       s/p CABG   Glaucoma Daughter    Colon cancer Neg Hx    Colon polyps Neg Hx    Esophageal cancer Neg Hx    Rectal cancer Neg Hx    Stomach cancer Neg Hx     Social History   Socioeconomic History   Marital status: Widowed    Spouse name: Not on file   Number of children: 4   Years of education: Not on file   Highest education level: Bachelor's degree (e.g., BA, AB, BS)  Occupational History   Not on file  Tobacco Use   Smoking status: Former    Current packs/day: 0.00    Average packs/day: 1 pack/day for 50.0 years (50.0 ttl pk-yrs)    Types: Cigarettes    Start date: 03/15/1961    Quit date: 03/16/2011    Years since quitting: 13.4   Smokeless tobacco: Never   Tobacco comments:    last 4-5 years of smoking, smoked 0.5 pack/day   Vaping Use   Vaping status: Never Used  Substance and Sexual Activity   Alcohol  use: No    Alcohol /week: 0.0 standard drinks of alcohol    Drug use: No   Sexual activity: Not Currently  Other Topics Concern   Not on file  Social History Narrative   Marital status: divorced; not dating in 2019.      Children: 4 children; 3 grandchildren adult; 4 gg.      Lives: alone in house      Employment: part-time work; substance abuse counselor; [employer].      Tobacco: quit smoking 2012; smoked 45 years      Alcohol : none      Drugs: none      Exercise: none in 2019; due to LEFT sciatica.      ADLs: independent with ADLs; drives.       Advanced Directives: YES: HCPOA: Nicholas Martinez/son.  FULL CODE but no prolonged measures.      Occupation: Substance Abuse Administrator, Civil Service   No exercise** Merged History Encounter  **       **  Data from: 12/14/11 Enc Dept: [facility]       ** Data from: 12/17/11 Enc Dept: [facility]   Substance abuse counselor   Husband deceased   4 great grandchildren   Son works in same substance abuse counseling center as patient   Social Drivers of Corporate investment banker Strain: Medium Risk (06/02/2024)   Overall Financial Resource Strain (CARDIA)    Difficulty of Paying Living Expenses: Somewhat hard  Food Insecurity: No Food Insecurity (08/27/2024)   Hunger Vital Sign    Worried About Running Out of Food in the Last Year: Never true    Ran Out of Food in the Last Year: Never true  Transportation Needs: No Transportation Needs (08/27/2024)   PRAPARE - Administrator, Civil Service (Medical): No    Lack of Transportation (Non-Medical): No  Recent Concern: Transportation Needs - Unmet Transportation Needs (06/02/2024)   PRAPARE - Transportation    Lack of Transportation (Medical): No    Lack of Transportation (Non-Medical): Yes  Physical Activity: Inactive (06/02/2024)   Exercise Vital Sign    Days of Exercise per Week: 0 days    Minutes of Exercise per Session: 0 min  Stress: No Stress Concern Present (06/02/2024)   [employer] of Occupational Health - Occupational Stress Questionnaire    Feeling of Stress: Only a little  Social Connections: Socially Isolated (08/27/2024)   Social Connection and Isolation Panel    Frequency of Communication with Friends and Family: More than three times a week    Frequency of Social Gatherings with Friends and Family: More than three times a week    Attends Religious Services: Never    Database administrator or Organizations: No    Attends Banker Meetings: Never    Marital Status: Divorced     Review of Systems: A 12 point ROS discussed and pertinent positives are indicated in the HPI above.  All other systems are negative.  Review of Systems  Constitutional:  Negative for  fatigue and fever.  Respiratory:  Positive for shortness of breath.   Cardiovascular:  Negative for chest pain.  Gastrointestinal:  Negative for abdominal pain, nausea and vomiting.  Musculoskeletal:  Positive for back pain.  Psychiatric/Behavioral:  Negative for behavioral problems.     Vital Signs: BP (!) 100/58 (BP Location: Right Arm)   Pulse 73   Temp 97.9 F (36.6 C) (Oral)   Resp 18   Ht 5' 3 (1.6 m)   Wt 164 lb 3.2 oz (74.5 kg)   SpO2 100%   BMI 29.09 kg/m   Physical Exam Vitals and nursing note reviewed.  Constitutional:      General: She is not in acute distress.    Appearance: She is well-developed. She is not ill-appearing.  Cardiovascular:     Rate and Rhythm: Normal rate and regular rhythm.  Pulmonary:     Effort: Pulmonary effort is normal.     Comments: On 7L [place] Musculoskeletal:     Cervical back: Normal range of motion.  Neurological:     General: No focal deficit present.     Mental Status: She is alert and oriented to person, place, and time.  Psychiatric:        Mood and Affect: Mood normal.        Behavior: Behavior normal.      MD Evaluation Airway: WNL Heart: WNL Abdomen: WNL Chest/ Lungs: WNL ASA  Classification: 3 Mallampati/Airway  Score: Two   Imaging: MR THORACIC SPINE WO CONTRAST Result Date: 08/28/2024 EXAM: MRI THORACIC SPINE WITHOUT INTRAVENOUS CONTRAST 08/27/2024 07:11:45 PM TECHNIQUE: Multiplanar multisequence MRI of the thoracic spine was performed without the administration of intravenous contrast. COMPARISON: MRI of the thoracic spine dated 12/21/2016 and CT of the thoracic spine dated 08/22/2024. CLINICAL HISTORY: Compression fracture, thoracic. FINDINGS: BONES AND ALIGNMENT: Normal alignment except for focal kyphosis at T4. There is a mild-to-moderate compression deformity of T7. Increased T2 signal is present throughout the T7 vertebral body, indicating the fracture is likely acute or subacute. Bone marrow edema is present  anteriorly within the T5 and T6 vertebral bodies. There are chronic compression deformities of T3 and T4, which have been treated with augmentation. The T4 vertebral body has lost approximately 75% of its height anteriorly, contributing to focal kyphosis as before. Um Bone marrow signal is unremarkable except for the findings described above. The patient is status post ACDF at C6-C7. SPINAL CORD: Normal spinal cord volume. Normal spinal cord signal. SOFT TISSUES: Bilateral pleural effusions, right larger than left. There is airspace consolidation within the left upper lobe. Other visualized soft tissues are unremarkable. DEGENERATIVE CHANGES: No significant disc herniation. No spinal canal stenosis or neural foraminal narrowing. IMPRESSION: 1. Mild-to-moderate compression deformity of T7 with increased T2 signal, likely acute or subacute. 2. Bone marrow edema anteriorly within the T5 and T6 vertebral bodies. 3. Chronic compression deformities of T3 and T4, treated with augmentation, with focal kyphosis at T4 as before. 4. Bilateral pleural effusions, right larger than left. 5. Airspace consolidation within the left upper lobe. Electronically signed by: Evalene Coho MD 08/28/2024 04:42 AM EDT RP Workstation: [id number]   ECHOCARDIOGRAM COMPLETE Result Date: 08/27/2024    ECHOCARDIOGRAM REPORT   Patient Name:   Tammy Ball Date of Exam: 08/27/2024 Medical Rec #:  [id number]      Height:       63.0 in Accession #:    [id number]     Weight:       164.2 lb Date of Birth:  Mar 20, 1941      BSA:          1.778 m Patient Age:    82 years       BP:           161/78 mmHg Patient Gender: F              HR:           84 bpm. Exam Location:  Inpatient Procedure: 2D Echo, Cardiac Doppler and Color Doppler (Both Spectral and Color            Flow Doppler were utilized during procedure). Indications:    CHF I50.31  History:        Patient has prior history of Echocardiogram examinations, most                 recent  06/10/2022. CHF, Previous Myocardial Infarction, COPD;                 Risk Factors:Hypertension. H/O Renal cell cancer.  Sonographer:    BERNARDA ROCKS Referring Phys: [id number] MOHAMMAD L GARBA IMPRESSIONS  1. Left ventricular ejection fraction, by estimation, is 60 to 65%. The left ventricle has normal function. The left ventricle has no regional wall motion abnormalities. There is mild concentric left ventricular hypertrophy. Left ventricular diastolic parameters are consistent with Grade II diastolic dysfunction (pseudonormalization). Elevated left atrial pressure.  2. Right ventricular systolic function is  normal. The right ventricular size is normal. There is moderately elevated pulmonary artery systolic pressure.  3. The mitral valve is normal in structure. Mild mitral valve regurgitation. No evidence of mitral stenosis.  4. The aortic valve is calcified. There is moderate calcification of the aortic valve. Aortic valve regurgitation is not visualized. Mild aortic valve stenosis.  5. Abdominal aorta is normal size.  6. The inferior vena cava is normal in size with greater than 50% respiratory variability, suggesting right atrial pressure of 3 mmHg. FINDINGS  Left Ventricle: Left ventricular ejection fraction, by estimation, is 60 to 65%. The left ventricle has normal function. The left ventricle has no regional wall motion abnormalities. The left ventricular internal cavity size was normal in size. There is  mild concentric left ventricular hypertrophy. Left ventricular diastolic parameters are consistent with Grade II diastolic dysfunction (pseudonormalization). Elevated left atrial pressure. Right Ventricle: The right ventricular size is normal. No increase in right ventricular wall thickness. Right ventricular systolic function is normal. There is moderately elevated pulmonary artery systolic pressure. The tricuspid regurgitant velocity is 3.61 m/s, and with an assumed right atrial pressure of 3 mmHg, the  estimated right ventricular systolic pressure is 55.1 mmHg. Left Atrium: Left atrial size was normal in size. Right Atrium: Right atrial size was normal in size. Pericardium: Trivial pericardial effusion is present. The pericardial effusion is circumferential. Mitral Valve: The mitral valve is normal in structure. Mild mitral valve regurgitation. No evidence of mitral valve stenosis. MV peak gradient, 7.7 mmHg. The mean mitral valve gradient is 3.0 mmHg. Tricuspid Valve: The tricuspid valve is normal in structure. Tricuspid valve regurgitation is not demonstrated. No evidence of tricuspid stenosis. Aortic Valve: The aortic valve is calcified. There is moderate calcification of the aortic valve. There is moderate aortic valve annular calcification. Aortic valve regurgitation is not visualized. Mild aortic stenosis is present. Aortic valve mean gradient measures 9.3 mmHg. Aortic valve peak gradient measures 21.0 mmHg. Aortic valve area, by VTI measures 1.82 cm. Pulmonic Valve: The pulmonic valve was normal in structure. Pulmonic valve regurgitation is not visualized. No evidence of pulmonic stenosis. Aorta: Abdominal aorta is normal size. The aortic root is normal in size and structure. Venous: The inferior vena cava is normal in size with greater than 50% respiratory variability, suggesting right atrial pressure of 3 mmHg. IAS/Shunts: No atrial level shunt detected by color flow Doppler.  LEFT VENTRICLE PLAX 2D LVIDd:         3.00 cm     Diastology LVIDs:         1.80 cm     LV e' medial:    5.22 cm/s LV PW:         1.10 cm     LV E/e' medial:  20.5 LV IVS:        1.20 cm     LV e' lateral:   7.51 cm/s LVOT diam:     1.80 cm     LV E/e' lateral: 14.2 LV SV:         76 LV SV Index:   43 LVOT Area:     2.54 cm  LV Volumes (MOD) LV vol d, MOD A2C: 72.1 ml LV vol d, MOD A4C: 73.2 ml LV vol s, MOD A2C: 16.4 ml LV vol s, MOD A4C: 21.0 ml LV SV MOD A2C:     55.7 ml LV SV MOD A4C:     73.2 ml LV SV MOD BP:      54.7  ml  RIGHT VENTRICLE             IVC RV Basal diam:  3.10 cm     IVC diam: 1.60 cm RV S prime:     11.90 cm/s TAPSE (M-mode): 2.5 cm RVSP:           55.1 mmHg LEFT ATRIUM             Index        RIGHT ATRIUM           Index LA diam:        2.20 cm 1.24 cm/m   RA Pressure: 3.00 mmHg LA Vol (A2C):   70.7 ml 39.76 ml/m  RA Area:     13.10 cm LA Vol (A4C):   21.1 ml 11.87 ml/m  RA Volume:   24.90 ml  14.00 ml/m LA Biplane Vol: 39.2 ml 22.05 ml/m  AORTIC VALVE                     PULMONIC VALVE AV Area (Vmax):    1.84 cm      PV Vmax:       1.24 m/s AV Area (Vmean):   1.97 cm      PV Peak grad:  6.2 mmHg AV Area (VTI):     1.82 cm AV Vmax:           229.00 cm/s AV Vmean:          141.000 cm/s AV VTI:            0.417 m AV Peak Grad:      21.0 mmHg AV Mean Grad:      9.3 mmHg LVOT Vmax:         166.00 cm/s LVOT Vmean:        109.000 cm/s LVOT VTI:          0.299 m LVOT/AV VTI ratio: 0.72  AORTA Ao Root diam: 3.00 cm Ao Asc diam:  3.30 cm MITRAL VALVE                TRICUSPID VALVE MV Area (PHT): 4.24 cm     TR Peak grad:   52.1 mmHg MV Area VTI:   2.03 cm     TR Vmax:        361.00 cm/s MV Peak grad:  7.7 mmHg     Estimated RAP:  3.00 mmHg MV Mean grad:  3.0 mmHg     RVSP:           55.1 mmHg MV Vmax:       1.39 m/s MV Vmean:      73.1 cm/s    SHUNTS MV Decel Time: 179 msec     Systemic VTI:  0.30 m MR Peak grad: 94.5 mmHg     Systemic Diam: 1.80 cm MR Vmax:      486.00 cm/s MV E velocity: 107.00 cm/s MV A velocity: 106.00 cm/s MV E/A ratio:  1.01 Kardie Tobb DO Electronically signed by Dub Huntsman DO Signature Date/Time: 08/27/2024/11:41:15 AM    Final    DG Chest Portable 1 View Result Date: 08/26/2024 EXAM: 1 VIEW(S) XRAY OF THE CHEST 08/26/2024 08:00:00 PM COMPARISON: CTA chest and portable chest both 08/22/2024. CLINICAL HISTORY: cough, hypoxia. Cough/hypoxia- Pt BIB ems for back pain, nausea, and SOB. Pt was diagnosed with a spinal fracture on Saturday, states they didn't do anything about it, she is  still in pain 10/10. States she is  having a hard time catching her breath ever since the accident. Hx of CHF, COPD, asthma, MI, HTN, right lung cancer, former smoker. FINDINGS: LUNGS AND PLEURA: There is central vascular prominence and mild interstitial edema in the lung bases with small pleural effusions. Stable appearance of hazy opacities in both lung bases and could indicate atelectasis, ground-glass edema, or pneumonitis. Remaining lungs are clear with mild emphysematous change. No pneumothorax. HEART AND MEDIASTINUM: Stable Cardiomegaly. There is a stable mediastinum with aortic tortuosity and atherosclerosis. BONES AND SOFT TISSUES: Osteopenia and asymmetric advanced arthrosis of the right shoulder. The recent study demonstrated a mild to moderate T7 anterior wedge compression fracture new from 03/16/2024 chest CT, but this is not well reassessed AP. IMPRESSION: 1. Stable cardiomegaly with central vascular prominence and mild interstitial edema in the lung bases with small pleural effusions. 2. Stable hazy bibasilar opacities that may represent atelectasis, edema, or pneumonitis. Overall aeration seems unchanged. 3. Mild to moderate T7 anterior wedge compression fracture, new from 03/16/2024 chest CT noted on recent CTA chest, not well reassessed AP. Electronically signed by: Francis Quam MD 08/26/2024 08:11 PM EDT RP Workstation: [id number]   CT L-SPINE NO CHARGE Result Date: 08/22/2024 CLINICAL DATA:  Back pain EXAM: CT LUMBAR SPINE WITHOUT CONTRAST TECHNIQUE: Multidetector CT imaging of the lumbar spine was performed without intravenous contrast administration. Multiplanar CT image reconstructions were also generated. RADIATION DOSE REDUCTION: This exam was performed according to the departmental dose-optimization program which includes automated exposure control, adjustment of the mA and/or kV according to patient size and/or use of iterative reconstruction technique. COMPARISON:  CT abdomen and  pelvis 08/19/2023 FINDINGS: Segmentation: 5 lumbar type vertebrae. Alignment: Slight degenerative anterolisthesis of L3 on L4 and L4 on L5, stable. Vertebrae: No acute fracture or focal pathologic process. Paraspinal and other soft tissues: Negative. Disc levels: Disc spaces maintained. Moderate degenerative facet disease, most pronounced in the lower lumbar spine. IMPRESSION: No acute bony abnormality. Electronically Signed   By: Franky Crease M.D.   On: 08/22/2024 19:05   CT T-SPINE NO CHARGE Result Date: 08/22/2024 CLINICAL DATA:  Back pain EXAM: CT THORACIC SPINE WITHOUT CONTRAST TECHNIQUE: Multidetector CT images of the thoracic were obtained using the standard protocol without intravenous contrast. RADIATION DOSE REDUCTION: This exam was performed according to the departmental dose-optimization program which includes automated exposure control, adjustment of the mA and/or kV according to patient size and/or use of iterative reconstruction technique. COMPARISON:  Rib CT chest 03/16/2024 FINDINGS: Alignment: No subluxation. Vertebrae: Chronic compression fractures status post vertebroplasty noted at T3 and T4. Mild compression fracture at T6, stable since prior study. Mild compression fracture at T7, new since prior study. Paraspinal and other soft tissues: Negative Disc levels: Degenerative disc disease diffusely, most pronounced in the upper thoracic spine. IMPRESSION: New mild compression fracture at T7. Chronic compression fractures status post vertebroplasty at T3 and T4. Stable mild chronic compression fracture at T6. Electronically Signed   By: Franky Crease M.D.   On: 08/22/2024 19:03   CT ABDOMEN PELVIS W CONTRAST Result Date: 08/22/2024 CLINICAL DATA:  Abdominal pain, back pain, shortness of breath EXAM: CT ABDOMEN AND PELVIS WITH CONTRAST TECHNIQUE: Multidetector CT imaging of the abdomen and pelvis was performed using the standard protocol following bolus administration of intravenous contrast.  RADIATION DOSE REDUCTION: This exam was performed according to the departmental dose-optimization program which includes automated exposure control, adjustment of the mA and/or kV according to patient size and/or use of iterative reconstruction technique. CONTRAST:  100mL  OMNIPAQUE  IOHEXOL  350 MG/ML SOLN COMPARISON:  None Available. FINDINGS: Lower chest: See chest CT report. Hepatobiliary: No focal hepatic abnormality. Gallbladder unremarkable. Pancreas: No focal abnormality or ductal dilatation. Spleen: No focal abnormality.  Normal size. Adrenals/Urinary Tract: Adrenal glands normal. Prior left nephrectomy. Scattered cysts within the right kidney are unchanged. No follow-up imaging recommended. No hydronephrosis. Urinary bladder unremarkable. Stomach/Bowel: Stomach, large and small bowel grossly unremarkable. No obstruction or inflammatory process. Vascular/Lymphatic: Diffuse aortoiliac atherosclerosis. No evidence of aneurysm or adenopathy. Reproductive: Uterus and adnexa unremarkable.  No mass. Other: No free fluid or free air. Musculoskeletal: No acute bony abnormality. IMPRESSION: No acute findings in the abdomen or pelvis. Diffuse aortoiliac atherosclerosis. Electronically Signed   By: Franky Crease M.D.   On: 08/22/2024 19:01   CT Angio Chest PE W and/or Wo Contrast Result Date: 08/22/2024 CLINICAL DATA:  Pulmonary embolism (PE) suspected, high prob. Shortness of breath, back pain. EXAM: CT ANGIOGRAPHY CHEST WITH CONTRAST TECHNIQUE: Multidetector CT imaging of the chest was performed using the standard protocol during bolus administration of intravenous contrast. Multiplanar CT image reconstructions and MIPs were obtained to evaluate the vascular anatomy. RADIATION DOSE REDUCTION: This exam was performed according to the departmental dose-optimization program which includes automated exposure control, adjustment of the mA and/or kV according to patient size and/or use of iterative reconstruction  technique. CONTRAST:  OMNIPAQUE  IOHEXOL  350 MG/ML SOLN COMPARISON:  03/16/2024 FINDINGS: Cardiovascular: Cardiomegaly. Scattered coronary artery and aortic atherosclerosis. No evidence of aortic aneurysm. No filling defects in the pulmonary arteries to suggest pulmonary emboli. Prominent central pulmonary arteries suggest pulmonary arterial hypertension. This is stable since prior study. Mediastinum/Nodes: No mediastinal, hilar, or axillary adenopathy. Trachea and esophagus are unremarkable. Thyroid  unremarkable. Lungs/Pleura: Trace bilateral pleural effusions. Mild centrilobular emphysema. Left base atelectasis or scarring, similar to prior study. No confluent airspace opacities otherwise. Upper Abdomen: No acute findings. Musculoskeletal: Chest wall soft tissues are unremarkable. Prior compression fractures and vertebroplasty changes in the upper thoracic spine, unchanged. Mild compression fracture at T6 is stable. Mild compression fracture at T7 is new since prior study. Review of the MIP images confirms the above findings. IMPRESSION: No evidence of pulmonary embolus. Cardiomegaly, coronary artery disease. Trace bilateral pleural effusions. New mild T7 compression fracture. Aortic Atherosclerosis (ICD10-I70.0) and Emphysema (ICD10-J43.9). Electronically Signed   By: Franky Crease M.D.   On: 08/22/2024 18:57   DG Chest Port 1 View Result Date: 08/22/2024 CLINICAL DATA:  Shortness of breath and back pain. EXAM: PORTABLE CHEST 1 VIEW COMPARISON:  03/16/2024, 05/10/2022. FINDINGS: The heart is enlarged and the mediastinal contour is within normal limits. There is atherosclerotic calcification of the aorta. Interstitial prominence and airspace disease is noted at the lung bases, unchanged from multiple prior exams and likely representing scarring. No definite effusion or pneumothorax is seen. Cervical spinal fusion hardware is present. No acute osseous abnormality. IMPRESSION: Stable chest with no active  disease. Electronically Signed   By: Leita Birmingham M.D.   On: 08/22/2024 14:59    Labs:  CBC: Recent Labs    08/26/24 1906 08/27/24 0005 08/27/24 0633 08/28/24 0412  WBC 11.6* 13.7* 14.2* 11.7*  HGB 12.7 12.7 12.3 11.6*  HCT 39.1 39.5 39.9 38.1  PLT 236 240 231 237    COAGS: Recent Labs    08/28/24 0412  INR 0.9    BMP: Recent Labs    08/26/24 1906 08/27/24 0005 08/27/24 0633 08/28/24 0412 08/28/24 1304  NA 128*  --  132* 129* 131*  K  5.0  --  5.3* 5.6* 6.0*  CL 86*  --  87* 87* 87*  CO2 33*  --  36* 33* 35*  GLUCOSE 121*  --  123* 121* 123*  BUN 29*  --  29* 36* 50*  CALCIUM  10.4*  --  9.9 9.4 9.3  CREATININE 0.87 0.82 1.02* 1.61* 1.90*  GFRNONAA >60 >60 55* 32* 26*    LIVER FUNCTION TESTS: Recent Labs    03/16/24 0916 08/22/24 1559 08/26/24 1906 08/27/24 0633  BILITOT 0.6 0.9 0.6 0.4  AST 19 21 25 20   ALT 15 13 16 15   ALKPHOS 71 84 81 76  PROT 6.9 6.7 6.7 6.5  ALBUMIN  4.4 4.2 4.2 3.9    TUMOR MARKERS: No results for input(s): AFPTM, CEA, CA199, CHROMGRNA in the last 8760 hours.  Assessment and Plan: T7 Compression fracture  Tammy Ball is an 83 year old female with history of prior MI, CHF, COPD on 2L [place] at baseline, s/p T3/T4 vertebroplasty in 2018 with Dr. Dolphus who returns with new compression fracture at T7.   Patient with intractable back pain requiring IV pain medication for patient rated 9-10/10 on admission only improved to 6/10 with pain medication alone. Team has requested VP/KP at T7.  Imaging reviewed by Dr. Hughes who has approved the patient for the procedure.   Significant work towards best plan for patient due to her degree of pain, alertness, and increased O2 needs.  Discussed with family at length the concern for poor tolerance of procedure in 2018 with conscious sedation alone, the need to schedule with performing MD and available rep. Initially they requested anesthesia, however given the timeline of  coordinating this have requested an attempt with moderate sedation.  They are aware procedure is done in prone position.   Plan: -IR will plan to attempt T7 VP/KP on Monday as schedule allows with Dr. Jenna using moderate sedation. -If poor tolerance or inability to safely sedate due to respiratory status, will plan to [facility] anesthesia. D -Will make NPO p MN Monday, order INR, and hold lovenox .  -Continue to monitor status over the next few days for worsening respiratory status, s/s infection which would impact our ability to safely proceed.   Risks and benefits of VP/KP were discussed with the patient including, but not limited to education regarding the natural healing process of compression fractures without intervention, bleeding, infection, cement migration which may cause spinal cord damage, paralysis, pulmonary embolism or even death.  This interventional procedure involves the use of X-rays and because of the nature of the planned procedure, it is possible that we will have prolonged use of X-ray fluoroscopy.  Potential radiation risks to you include (but are not limited to) the following: - A slightly elevated risk for cancer  several years later in life. This risk is typically less than 0.5% percent. This risk is low in comparison to the normal incidence of human cancer, which is 33% for women and 50% for men according to the [employer]. - Radiation induced injury can include skin redness, resembling a rash, tissue breakdown / ulcers and hair loss (which can be temporary or permanent).   The likelihood of either of these occurring depends on the difficulty of the procedure and whether you are sensitive to radiation due to previous procedures, disease, or genetic conditions.   IF your procedure requires a prolonged use of radiation, you will be notified and given written instructions for further action.  It is your responsibility to  monitor the irradiated area for  the 2 weeks following the procedure and to notify your physician if you are concerned that you have suffered a radiation induced injury.    All of the patient's questions were answered, patient is agreeable to proceed.  Consent signed and in chart.    Thank you for this interesting consult.  I greatly enjoyed meeting Tammy Ball and look forward to participating in their care.  A copy of this report was sent to the requesting provider on this date.  Electronically Signed: Ervin Rothbauer Sue-Ellen Miquel Stacks, PA 08/28/2024, 5:11 PM   I spent a total of 55 Miinutes    in face to face in clinical consultation, greater than 50% of which was counseling/coordinating care for T7 compression fracture.

## 2024-08-29 ENCOUNTER — Inpatient Hospital Stay (HOSPITAL_COMMUNITY)

## 2024-08-29 DIAGNOSIS — I10 Essential (primary) hypertension: Secondary | ICD-10-CM | POA: Diagnosis not present

## 2024-08-29 DIAGNOSIS — F329 Major depressive disorder, single episode, unspecified: Secondary | ICD-10-CM | POA: Diagnosis not present

## 2024-08-29 DIAGNOSIS — J9621 Acute and chronic respiratory failure with hypoxia: Secondary | ICD-10-CM | POA: Diagnosis not present

## 2024-08-29 DIAGNOSIS — S22000A Wedge compression fracture of unspecified thoracic vertebra, initial encounter for closed fracture: Secondary | ICD-10-CM | POA: Diagnosis not present

## 2024-08-29 DIAGNOSIS — J441 Chronic obstructive pulmonary disease with (acute) exacerbation: Secondary | ICD-10-CM

## 2024-08-29 LAB — BASIC METABOLIC PANEL WITH GFR
Anion gap: 6 (ref 5–15)
Anion gap: 8 (ref 5–15)
Anion gap: 12 (ref 5–15)
BUN: 56 mg/dL — ABNORMAL HIGH (ref 8–23)
BUN: 58 mg/dL — ABNORMAL HIGH (ref 8–23)
BUN: 63 mg/dL — ABNORMAL HIGH (ref 8–23)
CO2: 36 mmol/L — ABNORMAL HIGH (ref 22–32)
CO2: 34 mmol/L — ABNORMAL HIGH (ref 22–32)
CO2: 29 mmol/L (ref 22–32)
Calcium: 9.2 mg/dL (ref 8.9–10.3)
Calcium: 8.9 mg/dL (ref 8.9–10.3)
Calcium: 8.8 mg/dL — ABNORMAL LOW (ref 8.9–10.3)
Chloride: 87 mmol/L — ABNORMAL LOW (ref 98–111)
Chloride: 89 mmol/L — ABNORMAL LOW (ref 98–111)
Chloride: 89 mmol/L — ABNORMAL LOW (ref 98–111)
Creatinine, Ser: 1.9 mg/dL — ABNORMAL HIGH (ref 0.44–1.00)
Creatinine, Ser: 1.83 mg/dL — ABNORMAL HIGH (ref 0.44–1.00)
Creatinine, Ser: 1.79 mg/dL — ABNORMAL HIGH (ref 0.44–1.00)
GFR, Estimated: 26 mL/min — ABNORMAL LOW (ref >60)
GFR, Estimated: 27 mL/min — ABNORMAL LOW (ref >60)
GFR, Estimated: 28 mL/min — ABNORMAL LOW (ref >60)
Glucose, Bld: 127 mg/dL — ABNORMAL HIGH (ref 70–99)
Glucose, Bld: 127 mg/dL — ABNORMAL HIGH (ref 70–99)
Glucose, Bld: 102 mg/dL — ABNORMAL HIGH (ref 70–99)
Potassium: 5.4 mmol/L — ABNORMAL HIGH (ref 3.5–5.1)
Potassium: 5.3 mmol/L — ABNORMAL HIGH (ref 3.5–5.1)
Potassium: 5.4 mmol/L — ABNORMAL HIGH (ref 3.5–5.1)
Sodium: 129 mmol/L — ABNORMAL LOW (ref 135–145)
Sodium: 131 mmol/L — ABNORMAL LOW (ref 135–145)
Sodium: 130 mmol/L — ABNORMAL LOW (ref 135–145)

## 2024-08-29 LAB — BLOOD GAS, ARTERIAL
Acid-Base Excess: 2.6 mmol/L — ABNORMAL HIGH (ref 0.0–2.0)
Acid-Base Excess: 5.1 mmol/L — ABNORMAL HIGH (ref 0.0–2.0)
Bicarbonate: 35.9 mmol/L — ABNORMAL HIGH (ref 20.0–28.0)
Bicarbonate: 34.4 mmol/L — ABNORMAL HIGH (ref 20.0–28.0)
Delivery systems: POSITIVE
Drawn by: 560031
Drawn by: 560031
Expiratory PAP: 7 cmH2O
FIO2: 40 %
Inspiratory PAP: 20 cmH2O
Mode: POSITIVE
O2 Content: 15 L/min
O2 Saturation: 96.5 %
O2 Saturation: 97.4 %
PEEP: 7 cmH2O
Patient temperature: 36.8
Patient temperature: 36.8
RATE: 20 {breaths}/min
pCO2 arterial: 112 mmHg (ref 32–48)
pCO2 arterial: 74 mmHg (ref 32–48)
pH, Arterial: 7.11 — CL (ref 7.35–7.45)
pH, Arterial: 7.27 — ABNORMAL LOW (ref 7.35–7.45)
pO2, Arterial: 88 mmHg (ref 83–108)
pO2, Arterial: 93 mmHg (ref 83–108)

## 2024-08-29 LAB — MRSA NEXT GEN BY PCR, NASAL: MRSA by PCR Next Gen: NOT DETECTED

## 2024-08-29 MED ORDER — DEXMEDETOMIDINE HCL IN NACL 400 MCG/100ML IV SOLN
0.0000 ug/kg/h | INTRAVENOUS | Status: DC
  Administered 2024-08-29 – 2024-08-31 (×4): 0.5 ug/kg/h via INTRAVENOUS
  Administered 2024-08-31: 0.4 ug/kg/h via INTRAVENOUS
  Administered 2024-09-01: 0.3 ug/kg/h via INTRAVENOUS
  Filled 2024-08-29 (×6): qty 100

## 2024-08-29 MED ORDER — KETOROLAC TROMETHAMINE 15 MG/ML IJ SOLN
15.0000 mg | Freq: Once | INTRAMUSCULAR | Status: AC
  Administered 2024-08-29: 15 mg via INTRAVENOUS
  Filled 2024-08-29: qty 1

## 2024-08-29 MED ORDER — ARFORMOTEROL TARTRATE 15 MCG/2ML IN NEBU
15.0000 ug | INHALATION_SOLUTION | Freq: Two times a day (BID) | RESPIRATORY_TRACT | Status: DC
Start: 2024-08-29 — End: 2024-09-03
  Administered 2024-08-29 – 2024-09-03 (×11): 15 ug via RESPIRATORY_TRACT
  Filled 2024-08-29 (×11): qty 2

## 2024-08-29 MED ORDER — DEXMEDETOMIDINE HCL IN NACL 200 MCG/50ML IV SOLN
0.0000 ug/kg/h | INTRAVENOUS | Status: DC
  Administered 2024-08-29: 0.4 ug/kg/h via INTRAVENOUS
  Filled 2024-08-29 (×2): qty 50

## 2024-08-29 MED ORDER — CHLORHEXIDINE GLUCONATE CLOTH 2 % EX PADS
6.0000 | MEDICATED_PAD | Freq: Every day | CUTANEOUS | Status: DC
  Administered 2024-08-29 – 2024-09-02 (×5): 6 via TOPICAL

## 2024-08-29 MED ORDER — OLANZAPINE 10 MG IM SOLR
2.5000 mg | Freq: Once | INTRAMUSCULAR | Status: AC | PRN
  Administered 2024-08-30: 2.5 mg via INTRAMUSCULAR
  Filled 2024-08-29: qty 10

## 2024-08-29 MED ORDER — LIDOCAINE 5 % EX PTCH
1.0000 | MEDICATED_PATCH | CUTANEOUS | Status: DC
Start: 2024-08-29 — End: 2024-09-03
  Administered 2024-08-29 – 2024-09-03 (×6): 1 via TRANSDERMAL
  Filled 2024-08-29 (×6): qty 1

## 2024-08-29 MED ORDER — ORAL CARE MOUTH RINSE: 15.0000 mL | OROMUCOSAL | Status: DC | PRN

## 2024-08-29 MED ORDER — METHYLPREDNISOLONE SODIUM SUCC 40 MG IJ SOLR
40.0000 mg | Freq: Every day | INTRAMUSCULAR | Status: DC
  Administered 2024-08-30 – 2024-09-02 (×4): 40 mg via INTRAVENOUS
  Filled 2024-08-29 (×4): qty 1

## 2024-08-29 MED ORDER — HYDRALAZINE HCL 20 MG/ML IJ SOLN
10.0000 mg | Freq: Four times a day (QID) | INTRAMUSCULAR | Status: DC | PRN
  Administered 2024-08-30 – 2024-09-01 (×2): 10 mg via INTRAVENOUS
  Filled 2024-08-29 (×2): qty 1

## 2024-08-29 MED ORDER — REVEFENACIN 175 MCG/3ML IN SOLN
175.0000 ug | Freq: Every day | RESPIRATORY_TRACT | Status: DC
  Administered 2024-08-29 – 2024-09-03 (×6): 175 ug via RESPIRATORY_TRACT
  Filled 2024-08-29 (×6): qty 3

## 2024-08-29 MED ORDER — ACETAMINOPHEN 10 MG/ML IV SOLN
1000.0000 mg | Freq: Once | INTRAVENOUS | Status: AC
  Administered 2024-08-29: 1000 mg via INTRAVENOUS
  Filled 2024-08-29: qty 100

## 2024-08-29 MED ORDER — FUROSEMIDE 10 MG/ML IJ SOLN
40.0000 mg | Freq: Once | INTRAMUSCULAR | Status: AC
  Administered 2024-08-29: 40 mg via INTRAVENOUS
  Filled 2024-08-29: qty 4

## 2024-08-29 NOTE — Consult Note (Signed)
 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   She developed progress respiratory failure since admission and transferred to step down unit today for hypercapnic respiratory failure and placed on bipap.   She was last seen 04/29/24 by Dr. Shelah. She is on breztri  inhaler for her COPD regimen.   History obtained from her daughter at bedside. Patient has experience general health decline over the past 8 years. She has been frustrated with the activity level recently as she has not been able to do as much as she would like. She has been in a great deal of pain since her compression fracture which has hindered her breathing. She has also been getting narcotic pain medications.  Pertinent  Medical History   COPD Diastolic Heart Failure Renal cell carcinoma s/p nephrectomy GERD Chronic Respiratory Failure Lung Cancer  Significant Hospital Events: Including procedures, antibiotic start and stop dates in addition to other pertinent events   10/15 admitted 10/18 transferred to stepdown for hypercapnic respiratory failure  Interim History / Subjective:  As above  Objective    Blood pressure (!) 95/47, pulse 74, temperature (!) 97.4 F (36.3 C), temperature source Oral, resp. rate 20, height 5' 3 (1.6 m), weight 78.1 kg, SpO2 95%.    FiO2 (%):  [40 %] 40 % PEEP:  [7 cmH20] 7 cmH20   Intake/Output Summary (Last 24 hours) at 08/29/2024 1054 Last data filed at 08/29/2024 9046 Gross per 24 hour  Intake  861.62 ml  Output 150 ml  Net 711.62 ml   Filed Weights   08/26/24 1814 08/26/24 2340 08/29/24 0806  Weight: 73 kg 74.5 kg 78.1 kg    Examination: General: elderly woman, in distress, bipap in place HENT: [place]/AT, moist mucous membranes Lungs: diminished breath sounds, no wheezing Cardiovascular: rrr, no murmurs Abdomen: soft, non-tender, non-distended, BS+ Extremities: warm, no edema Neuro: moving extremities, somnolent GU: n/a  Resolved problem list   Assessment and Plan   Acute on Chronic Hypercapnic and Hypoxemic Respiratory Failure COPD with acute exacerbation Hx of Lung Cancer s/p RUL Lobectomy Chronic Diastolic Heart Failure T7 Compression Fracture Severe Pain   Plan - Continue supplemental oxygen  for goal SpO2 88-92% - Continue bipap  - repeat ABG this afternoon to determine need for intubation - discussed intubation with daughter, will follow ABGs. Patient would likely be more comfortable with sedation/analgesia with intubation until kyphoplasty performed. - start yupelri and brovana nebs - reduce solumedrol dose to 40mg  IV daily from 125mg  daily - agree with ceftriaxone  and azithromycin  for CAP coverage - add low dose precedex for comfort - avoid narcotics as able, difficult with severe pain  Labs   CBC: Recent Labs  Lab 08/22/24 1559 08/26/24 1906 08/27/24 0005 08/27/24 0633 08/28/24 0412  WBC 8.0 11.6* 13.7* 14.2* 11.7*  NEUTROABS  --  9.8*  --   --  10.6*  HGB 12.2 12.7 12.7 12.3 11.6*  HCT 39.2 39.1 39.5 39.9 38.1  MCV 98.2 97.0 98.8 100.3* 100.5*  PLT 203 236 240 231 237    Basic  Metabolic Panel: Recent Labs  Lab 08/28/24 0412 08/28/24 1304 08/28/24 1848 08/29/24 0031 08/29/24 0720  NA 129* 131* 130* 129* 131*  K 5.6* 6.0* 5.2* 5.4* 5.3*  CL 87* 87* 86* 87* 89*  CO2 33* 35* 36* 36* 34*  GLUCOSE 121* 123* 125* 127* 127*  BUN 36* 50* 52* 56* 58*  CREATININE 1.61* 1.90* 1.63* 1.90* 1.83*  CALCIUM  9.4 9.3 9.1 9.2 8.9    GFR: Estimated Creatinine Clearance: 24.3 mL/min (A) (by C-G formula based on SCr of 1.83 mg/dL (H)). Recent Labs  Lab 08/26/24 1906 08/27/24 0005 08/27/24 0633 08/28/24 0412  WBC 11.6* 13.7* 14.2* 11.7*    Liver Function Tests: Recent Labs  Lab 08/22/24 1559 08/26/24 1906 08/27/24 0633  AST 21 25 20   ALT 13 16 15   ALKPHOS 84 81 76  BILITOT 0.9 0.6 0.4  PROT 6.7 6.7 6.5  ALBUMIN  4.2 4.2 3.9   Recent Labs  Lab 08/22/24 1559  LIPASE 21   No results for input(s): AMMONIA in the last 168 hours.  ABG    Component Value Date/Time   PHART 7.11 (LL) 08/29/2024 0732   PCO2ART 112 (HH) 08/29/2024 0732   PO2ART 88 08/29/2024 0732   HCO3 35.9 (H) 08/29/2024 0732   TCO2 31 01/19/2016 0424   ACIDBASEDEF 1.0 01/18/2016 1120   O2SAT 96.5 08/29/2024 0732     Coagulation Profile: Recent Labs  Lab 08/28/24 0412  INR 0.9    Cardiac Enzymes: No results for input(s): CKTOTAL, CKMB, CKMBINDEX, TROPONINI in the last 168 hours.  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   Surgical History:   Past Surgical  History:  Procedure Laterality Date   BACK SURGERY     cervical 1991   BIOPSY  11/15/2022   Procedure: BIOPSY;  Surgeon: San Sandor GAILS, DO;  Location: [facility];  Service: Gastroenterology;;   COLONOSCOPY WITH PROPOFOL  N/A 11/15/2022   Procedure: COLONOSCOPY WITH PROPOFOL ;  Surgeon: San Sandor GAILS, DO;  Location: [facility];  Service: Gastroenterology;  Laterality: N/A;   ESOPHAGOGASTRODUODENOSCOPY (EGD) WITH PROPOFOL  N/A 11/15/2022   Procedure: ESOPHAGOGASTRODUODENOSCOPY (EGD) WITH PROPOFOL ;  Surgeon: San Sandor GAILS, DO;  Location: [facility];  Service: Gastroenterology;  Laterality: N/A;   EYE SURGERY     IR GENERIC HISTORICAL  01/16/2017   IR RADIOLOGIST EVAL & MGMT 01/16/2017 [facility]   IR GENERIC HISTORICAL  01/31/2017   IR FLUORO GUIDED NEEDLE PLC ASPIRATION/INJECTION LOC 01/31/2017 [name], MD [facility]   IR GENERIC HISTORICAL  01/31/2017   IR VERTEBROPLASTY CERV/THOR BX INC UNI/BIL INC/INJECT/IMAGING 01/31/2017 [name], MD [facility]   IR GENERIC HISTORICAL  01/31/2017   IR VERTEBROPLASTY EA ADDL (T&LS) BX INC UNI/BIL INC INJECT/IMAGING 01/31/2017 [name], MD [facility]   kidney cancer     LOBECTOMY Right    RUL  NEPHRECTOMY  2012   POLYPECTOMY  11/15/2022   Procedure: POLYPECTOMY;  Surgeon: San Sandor GAILS, DO;  Location: [facility];  Service: Gastroenterology;;   SPINE SURGERY     TUBAL LIGATION     VIDEO ASSISTED THORACOSCOPY (VATS)/WEDGE RESECTION Right 01/18/2016   Procedure: VIDEO ASSISTED THORACOSCOPY (VATS)/LUNG RESECTION, THOROCOTOMY, RIGHT UPPER LOBECTOMY, LYMPH NODE DISSECTION, PLACEMENT OF ON Q;  Surgeon: [name], MD;  Location: [facility];  Service: Thoracic;  Laterality: Right;   VIDEO BRONCHOSCOPY Bilateral 09/20/2015   Procedure: VIDEO BRONCHOSCOPY WITHOUT FLUORO;  Surgeon: [name], MD;  Location: [facility];  Service: Cardiopulmonary;  Laterality: Bilateral;   VIDEO BRONCHOSCOPY N/A  01/18/2016   Procedure: VIDEO BRONCHOSCOPY;  Surgeon: [name], MD;  Location: [facility] OR;  Service: Thoracic;  Laterality: N/A;     Social History:   reports that she quit smoking about 13 years ago. Her smoking use included cigarettes. She started smoking about 63 years ago. She has a 50 pack-year smoking history. She has never used smokeless tobacco. She reports that she does not drink alcohol  and does not use drugs.   Family History:  Her family history includes Birth defects in her sister; Breast cancer in her sister; CAD (age of onset: 67) in her mother; Cancer in her maternal grandmother and mother; Glaucoma in her daughter; Heart attack (age of onset: 60) in her daughter; Heart disease in her sister; Hypertension in her mother; Obesity in her brother. There is no history of Colon cancer, Colon polyps, Esophageal cancer, Rectal cancer, or Stomach cancer.   Allergies Allergies  Allergen Reactions   Bee Venom Anaphylaxis, Shortness Of Breath, Swelling and Other (See Comments)    Swelling at site (reaction to bees and wasps)   Amlodipine  Swelling and Other (See Comments)    Swelling of the ankles and hands    Levofloxacin Other (See Comments)    Joint pain    Alendronate  Other (See Comments)    Joint pains / hypercalcemia    Hctz [Hydrochlorothiazide ] Palpitations and Other (See Comments)    Sweating      Home Medications  Prior to Admission medications   Medication Sig Start Date End Date Taking? Authorizing Provider  acetaminophen  (TYLENOL ) 500 MG tablet Take 500 mg by mouth every 6 (six) hours as needed for headache or moderate pain (pain).   Yes [provider]  albuterol  (PROVENTIL ) (2.5 MG/3ML) 0.083% nebulizer solution INHALE 3 ML BY NEBULIZATION EVERY 6 HOURS AS NEEDED FOR WHEEZING OR SHORTNESS OF BREATH 04/30/24  Yes Byrum, Lamar RAMAN, MD  albuterol  (VENTOLIN  HFA) 108 (90 Base) MCG/ACT inhaler Inhale 1-2 puffs into the lungs every 6 (six) hours as needed for  wheezing or shortness of breath. 04/29/24  Yes [name], MD  Ascorbic Acid  (VITAMIN C) 1000 MG tablet Take 1,000 mg by mouth every morning.   Yes [provider]  aspirin  EC 81 MG tablet Take 81 mg by mouth every morning.   Yes [provider]  atorvastatin  (LIPITOR) 40 MG tablet TAKE 1 TABLET BY MOUTH EVERY DAY 02/02/24  Yes [name], Miguel Jose, MD  budesonide -glycopyrrolate -formoterol  (BREZTRI  AEROSPHERE) 160-9-4.8 MCG/ACT AERO inhaler Inhale 2 puffs into the lungs in the morning and at bedtime. 04/29/24  Yes [name], MD  Calcium  Carbonate Antacid (TUMS PO) Take 1 tablet by mouth 4 (four) times daily as needed (heartburn).   Yes [provider]  Carboxymethylcellulose Sodium (ARTIFICIAL TEARS OP) Place 1 drop into both eyes daily as  needed (dry eyes).   Yes [provider]  carvedilol  (COREG ) 25 MG tablet Take 1 tablet (25 mg total) by mouth 2 (two) times daily. 04/15/24  Yes [name], [name], NP  cholecalciferol  (VITAMIN D3) 25 MCG (1000 UNIT) tablet Take 1,000 Units by mouth daily.   Yes [provider]  cycloSPORINE (RESTASIS) 0.05 % ophthalmic emulsion Place 1 drop into both eyes See admin instructions. Every 2 hours 08/02/24  Yes [provider]  Guaifenesin  (MUCINEX  MAXIMUM STRENGTH) 1200 MG TB12 Take 1,200 mg by mouth every morning.   Yes [provider]  HYDROcodone -acetaminophen  (NORCO/VICODIN) 5-325 MG tablet Take 1 tablet by mouth every 6 (six) hours as needed. 08/25/24  Yes [name], [name], MD  loperamide  (IMODIUM ) 2 MG capsule Take 1 capsule (2 mg total) by mouth 4 (four) times daily as needed for diarrhea or loose stools. 08/19/23  Yes [name], [name], MD  losartan  (COZAAR ) 100 MG tablet TAKE 1 TABLET BY MOUTH EVERY DAY 02/17/24  Yes [name], [name], MD  Omega-3 Fatty Acids  (FISH OIL) 1200 MG CAPS Take 1,200 mg by mouth every morning.   Yes [provider]  ondansetron  (ZOFRAN -ODT) 4 MG  disintegrating tablet 4mg  ODT q4 hours prn nausea/vomit 08/22/24  Yes Zammit, Joseph, MD  OXYGEN  Inhale 2 L into the lungs as needed (to keep SATS at or above 90).   Yes [provider]  sodium chloride  (OCEAN) 0.65 % SOLN nasal spray Place 1 spray into both nostrils at bedtime as needed for congestion.   Yes [provider]  traMADol  (ULTRAM ) 50 MG tablet Take 1 tablet (50 mg total) by mouth every 6 (six) hours as needed. 08/22/24  Yes Suzette Pac, MD  traZODone  (DESYREL ) 50 MG tablet Take 0.5-1 tablets (25-50 mg total) by mouth at bedtime as needed for sleep. 06/16/24  Yes SagardiaEmil Schanz, MD  diphenhydrAMINE  (BENADRYL ) 25 mg capsule Take 25 mg by mouth at bedtime as needed for allergies. Patient not taking: Reported on 08/26/2024    [provider]     Critical care time: 40 minutes    CRITICAL CARE Performed by: Dorn KATHEE Chill   Total critical care time: 40 minutes  Critical care time was exclusive of separately billable procedures and treating other patients.  Critical care was necessary to treat or prevent imminent or life-threatening deterioration.  Critical care was time spent personally by me on the following activities: development of treatment plan with patient and/or surrogate as well as nursing, discussions with consultants, evaluation of patient's response to treatment, examination of patient, obtaining history from patient or surrogate, ordering and performing treatments and interventions, ordering and review of laboratory studies, ordering and review of radiographic studies, pulse oximetry, re-evaluation of patient's condition and participation in multidisciplinary rounds.  Dorn Chill, MD [facility] Office: [phone]   See Amion for personal pager PCCM on call pager [phone] until 7pm. Please call Elink 7p-7a. [phone]

## 2024-08-29 NOTE — Progress Notes (Addendum)
 OT Cancellation Note  Patient Details Name: [name] MRN: [id number] DOB: [birth year]   Cancelled Treatment:    Reason Eval/Treat Not Completed: Other (comment) Per chart, pt now requiring 13L O2 and transferred to ICU. Pt also with acute T7 compression fracture and plan for vertebroplasty/kyphoplasty Monday 10/20. Will hold therapy at this time and await new orders.    Verneita ONEIDA Moose 08/29/2024, 8:13 AM

## 2024-08-29 NOTE — Plan of Care (Signed)

## 2024-08-29 NOTE — Progress Notes (Signed)
 eLink Physician-Brief Progress Note [name] DOB: Sep 05, 1941 MRN: [id number]   Date of Service  08/29/2024  HPI/Events of Note  Patient on BIPAP gets restless at times, titrating Precedex and drops BP. Precedex @ 0.6 and MAP 71  Seen restless with family member at bedsdie  eICU Interventions  Ordered a one time dose of Zyprexa 2.5 mg IM for agitation. Monitor response.     Intervention Category Intermediate Interventions: Hypotension - evaluation and management Minor Interventions: Agitation / anxiety - evaluation and management  Tammy Ball 08/29/2024, 8:46 PM

## 2024-08-29 NOTE — Progress Notes (Signed)
 Late entry: While observing patient during AM rounds, writer noticed oxygen  saturation level was fluctuating between 78%-85% on 5L [place]. Writer increased oxygen  to 7L [place]. Patient's oxygen  saturation level fluctuated between 82%-87%. Writer contacted respiratory therapist. Writer advised to place patient on 15L [place]. Respiratory therapist came bedside. Writer notified attending MD and CN via secure chat. CN contacted RRT who subsequently came bedside to assess patient. Respiratory therapist obtained ABG per protocol. CN paged attending MD. Attending MD returned page and advised writer to transfer patient to ICU for further management of her care. Writer provided bedside report to ICU nurse. Writer contacted daughter and POA, Levon, to advise her of change in patient's status as well as transfer to ICU. Daughter verbalized understanding and asked about patient's belongings. Myles Levon that her sister, Devere, may have taken patient's belongings when she visited patient on 08/28/24. Writer and EVS searched room and there were no patient belongings remaining in room.

## 2024-08-29 NOTE — Progress Notes (Addendum)
 PROGRESS NOTE    Tammy Ball  FMW:[id number] DOB: 1941/10/27 DOA: 08/26/2024 PCP: Purcell [name], MD   Brief Narrative:  Tammy Ball is a 83 y.o. female with medical history significant of thoracic spinal fracture involving T7 that was recently diagnosed, history of lung cancer status post lobectomy and chemotherapy, history of renal cell carcinoma, chemotherapy induced neuropathy, diastolic dysfunction CHF, COPD with chronic respiratory failure on 2 L of oxygen  at home, depression, essential hypertension, hyperlipidemia, coronary artery disease, radiation induced neuropathy, history of left nephrectomy who has been following up with PCP as well as emergency room after significant back pain that started about 2 weeks ago. Patiently as above with acute onset back pain, imaging consistent with wedge fracture, hospitalist called for admission.  Assessment & Plan: Principal Problem:   Acute on chronic respiratory failure (HCC) Active Problems:   Essential hypertension   COPD GOLD B-C   Renal cell cancer (HCC)   Depression   Hyperlipidemia   Non-small cell carcinoma of right lung, stage 2 (HCC)   S/P lobectomy of lung   Age-related osteoporosis with current pathological fracture   Stage 3a chronic kidney disease (HCC)   Chronic diastolic CHF (congestive heart failure) (HCC)   Hyponatremia   Closed compression fracture of thoracic vertebra (HCC)  Acute on chronic hypoxic hypercarbic respiratory failure, multifactorial, POA:  - Given acuity of hypoxia suspect this is most notably affected by her recent compression fracture - Continue to titrate oxygen  as appropriate, currently requiring 15L nasal cannula to maintain sats while splinting -rapid response 10/18 -on 15L [place]-->transition to BiPAP given elevated pCO2 -Precedex trial to improve BiPAP compliance (on 2 L nasal cannula at home at baseline) - Likely compression atelectasis given fracture - Concurrent history of heart  failure and COPD complicate her respiratory status but again are less likely playing a role given above with no signs/symptoms of exacerbation - Trial Lasix  x 1, hold IV fluids in the interim  Hyperkalemia - Lokelma x2 today -improving, furosemide  x 1 as above  Acute T7 compression fracture, POA With respiratory compromise - Reports no trauma or falls, unclear etiology - Interventional radiology to evaluate for possible kyphoplasty -tentatively scheduled for 08/31/24 - Pain moderately well-controlled on current regimen including oxycodone  and IV dilaudid  but continues to have profound hypoxia -trial acetaminophen /Toradol  per discussion with IR  Acute hospital delirium in the setting of narcotics - Mental status continues to wax and wane -most notably around narcotics - Hold IV narcotics unless absolutely necessary - Family indicates patient poorly tolerates morphine  - mild improvement with IV dilaudid   Hypotension - Likely multifactorial in setting of poor p.o. intake, narcotics and ongoing home regimen - Carvedilol  discontinued, IVF overnight after bolus - BP improved today after fluids  Acute heart failure exacerbation unlikely  - Repeat echo pending, most recent prior 2023, EF 60 to 65% with mild LV hypertrophy and grade 2 diastolic dysfunction -Furosemide  x1 today  Rule out pneumonia versus compression atelectasis  - Empiric treatment with IV antibiotics completed - no fevers, cough, sputum production with only minimally elevated WBC in the setting of acute stress/pain.  Hyperlipidemia: Statin   Essential hypertension: Continue carvedilol ; hold other home medications - BP well controlled while on narcotics may avoid home regimen to avoid hypotension  History of lung cancer: In remission, history of chemo and radiation  Renal cell carcinoma: Status post nephrectomy  Chronic kidney disease stage IIIa: Appears to be near baseline  Hyponatremia: In the setting of hypovolemia,  improving with fluids Osteoporosis: Recommend further bone studies including bone density studies/repeat imaging in the outpatient setting as appropriate.  Hypercalcemia: Not clinically relevant -currently back to baseline/within normal limits  DVT prophylaxis: enoxaparin  (LOVENOX ) injection 30 mg Start: 08/28/24 1000 SCDs Start: 08/26/24 2253 Code Status:   Code Status: Full Code Family Communication: Nikki updated over the phone  Status is: Inpatient  Dispo: The patient is from: Home              Anticipated d/c is to: Home              Anticipated d/c date is: 72+ hours              Patient currently not medically stable for discharge  Consultants:  Interventional radiology  Procedures:  Potential kyphoplasty in the next 24 to 48 hours  Antimicrobials:  Azithromycin , ceftriaxone  **low threshold to discontinue after 72 hours given lack of symptoms  Subjective: No acute issues or events overnight, patient noted with acutely worsening hypoxia this morning, rapid response called patient taken to stepdown for further monitoring ultimately requiring BiPAP for respiratory support and hypercarbia.  Family updated over the phone, not at bedside presently  Objective: Vitals:   08/28/24 1615 08/28/24 1720 08/28/24 2139 08/29/24 0600  BP: (!) 100/58 (!) 87/60 (!) 88/67 (!) 86/47  Pulse: 73 75 75 70  Resp:   18 20  Temp: 97.9 F (36.6 C) 98.4 F (36.9 C) 98.2 F (36.8 C)   TempSrc: Oral Oral Oral   SpO2: 100% 100% 99% 98%  Weight:      Height:        Intake/Output Summary (Last 24 hours) at 08/29/2024 0727 Last data filed at 08/28/2024 2114 Gross per 24 hour  Intake 517.62 ml  Output 150 ml  Net 367.62 ml   Filed Weights   08/26/24 1814 08/26/24 2340  Weight: 73 kg 74.5 kg    Examination:  General: Mild distress secondary to pain, tolerating BiPAP well. HEENT:  Normocephalic atraumatic.  Sclerae nonicteric, noninjected.  Extraocular movements intact  bilaterally. Neck:  Without mass or deformity.  Trachea is midline. Lungs: Diminished bilaterally with poor chest wall expansion without overt rales wheeze or rhonchi. Heart:  Regular rate and rhythm.  Without murmurs, rubs, or gallops. Abdomen:  Soft, nontender, nondistended.  Without guarding or rebound. Extremities: Without cyanosis, clubbing, edema, or obvious deformity  Data Reviewed: I have personally reviewed following labs and imaging studies  CBC: Recent Labs  Lab 08/22/24 1559 08/26/24 1906 08/27/24 0005 08/27/24 0633 08/28/24 0412  WBC 8.0 11.6* 13.7* 14.2* 11.7*  NEUTROABS  --  9.8*  --   --  10.6*  HGB 12.2 12.7 12.7 12.3 11.6*  HCT 39.2 39.1 39.5 39.9 38.1  MCV 98.2 97.0 98.8 100.3* 100.5*  PLT 203 236 240 231 237   Basic Metabolic Panel: Recent Labs  Lab 08/27/24 0633 08/28/24 0412 08/28/24 1304 08/28/24 1848 08/29/24 0031  NA 132* 129* 131* 130* 129*  K 5.3* 5.6* 6.0* 5.2* 5.4*  CL 87* 87* 87* 86* 87*  CO2 36* 33* 35* 36* 36*  GLUCOSE 123* 121* 123* 125* 127*  BUN 29* 36* 50* 52* 56*  CREATININE 1.02* 1.61* 1.90* 1.63* 1.90*  CALCIUM  9.9 9.4 9.3 9.1 9.2   GFR: Estimated Creatinine Clearance: 22.8 mL/min (A) (by C-G formula based on SCr of 1.9 mg/dL (H)).  Liver Function Tests: Recent Labs  Lab 08/22/24 1559 08/26/24 1906 08/27/24 0633  AST 21 25 20  ALT 13 16 15   ALKPHOS 84 81 76  BILITOT 0.9 0.6 0.4  PROT 6.7 6.7 6.5  ALBUMIN  4.2 4.2 3.9   Recent Labs  Lab 08/22/24 1559  LIPASE 21   BNP (last 3 results) Recent Labs    08/26/24 1906  PROBNP 1,699.0*   CBG: Recent Labs  Lab 08/26/24 2038  GLUCAP 145*    Recent Results (from the past 240 hours)  Blood culture (routine x 2)     Status: None (Preliminary result)   Collection Time: 08/26/24 10:10 PM   Specimen: BLOOD RIGHT ARM  Result Value Ref Range Status   Specimen Description   Final    BLOOD RIGHT ARM Performed at [facility], [address], [place], [place]  [address]    Special Requests   Final    Blood Culture results may not be optimal due to an inadequate volume of blood received in culture bottles BOTTLES DRAWN AEROBIC AND ANAEROBIC Performed at [facility], [address] [place], [place] [address]    Culture   Final    NO GROWTH 1 DAY Performed at [facility], [address] [place], [place] [address]    Report Status PENDING  Incomplete  Blood culture (routine x 2)     Status: None (Preliminary result)   Collection Time: 08/26/24 10:15 PM   Specimen: BLOOD  Result Value Ref Range Status   Specimen Description   Final    BLOOD RIGHT ANTECUBITAL Performed at [facility], [address] [place], [place] [address]    Special Requests   Final    Blood Culture results may not be optimal due to an inadequate volume of blood received in culture bottles BOTTLES DRAWN AEROBIC AND ANAEROBIC Performed at [facility], [address], [place], [place] [address]    Culture   Final    NO GROWTH 1 DAY Performed at [facility], [address] [place], [place] [address]    Report Status PENDING  Incomplete         Radiology Studies: MR THORACIC SPINE WO CONTRAST Result Date: 08/28/2024 EXAM: MRI THORACIC SPINE WITHOUT INTRAVENOUS CONTRAST 08/27/2024 07:11:45 PM TECHNIQUE: Multiplanar multisequence MRI of the thoracic spine was performed without the administration of intravenous contrast. COMPARISON: MRI of the thoracic spine dated 12/21/2016 and CT of the thoracic spine dated 08/22/2024. CLINICAL HISTORY: Compression fracture, thoracic. FINDINGS: BONES AND ALIGNMENT: Normal alignment except for focal kyphosis at T4. There is a mild-to-moderate compression deformity of T7. Increased T2 signal is present throughout the T7 vertebral body, indicating the fracture is likely acute or subacute. Bone marrow edema is present anteriorly within the T5 and T6 vertebral bodies. There  are chronic compression deformities of T3 and T4, which have been treated with augmentation. The T4 vertebral body has lost approximately 75% of its height anteriorly, contributing to focal kyphosis as before. Um Bone marrow signal is unremarkable except for the findings described above. The patient is status post ACDF at C6-C7. SPINAL CORD: Normal spinal cord volume. Normal spinal cord signal. SOFT TISSUES: Bilateral pleural effusions, right larger than left. There is airspace consolidation within the left upper lobe. Other visualized soft tissues are unremarkable. DEGENERATIVE CHANGES: No significant disc herniation. No spinal canal stenosis or neural foraminal narrowing. IMPRESSION: 1. Mild-to-moderate compression deformity of T7 with increased T2 signal, likely acute or subacute. 2. Bone marrow edema anteriorly within the T5 and T6 vertebral bodies. 3.  Chronic compression deformities of T3 and T4, treated with augmentation, with focal kyphosis at T4 as before. 4. Bilateral pleural effusions, right larger than left. 5. Airspace consolidation within the left upper lobe. Electronically signed by: Evalene Coho MD 08/28/2024 04:42 AM EDT RP Workstation: [id number]   ECHOCARDIOGRAM COMPLETE Result Date: 08/27/2024    ECHOCARDIOGRAM REPORT   Patient Name:   DOMINIQUE RESSEL Date of Exam: 08/27/2024 Medical Rec #:  [id number]      Height:       63.0 in Accession #:    [id number]     Weight:       164.2 lb Date of Birth:  [birth year]      BSA:          1.778 m Patient Age:    82 years       BP:           161/78 mmHg Patient Gender: F              HR:           84 bpm. Exam Location:  Inpatient Procedure: 2D Echo, Cardiac Doppler and Color Doppler (Both Spectral and Color            Flow Doppler were utilized during procedure). Indications:    CHF I50.31  History:        Patient has prior history of Echocardiogram examinations, most                 recent 06/10/2022. CHF, Previous Myocardial Infarction, COPD;                  Risk Factors:Hypertension. H/O Renal cell cancer.  Sonographer:    BERNARDA ROCKS Referring Phys: [id number] MOHAMMAD L GARBA IMPRESSIONS  1. Left ventricular ejection fraction, by estimation, is 60 to 65%. The left ventricle has normal function. The left ventricle has no regional wall motion abnormalities. There is mild concentric left ventricular hypertrophy. Left ventricular diastolic parameters are consistent with Grade II diastolic dysfunction (pseudonormalization). Elevated left atrial pressure.  2. Right ventricular systolic function is normal. The right ventricular size is normal. There is moderately elevated pulmonary artery systolic pressure.  3. The mitral valve is normal in structure. Mild mitral valve regurgitation. No evidence of mitral stenosis.  4. The aortic valve is calcified. There is moderate calcification of the aortic valve. Aortic valve regurgitation is not visualized. Mild aortic valve stenosis.  5. Abdominal aorta is normal size.  6. The inferior vena cava is normal in size with greater than 50% respiratory variability, suggesting right atrial pressure of 3 mmHg. FINDINGS  Left Ventricle: Left ventricular ejection fraction, by estimation, is 60 to 65%. The left ventricle has normal function. The left ventricle has no regional wall motion abnormalities. The left ventricular internal cavity size was normal in size. There is  mild concentric left ventricular hypertrophy. Left ventricular diastolic parameters are consistent with Grade II diastolic dysfunction (pseudonormalization). Elevated left atrial pressure. Right Ventricle: The right ventricular size is normal. No increase in right ventricular wall thickness. Right ventricular systolic function is normal. There is moderately elevated pulmonary artery systolic pressure. The tricuspid regurgitant velocity is 3.61 m/s, and with an assumed right atrial pressure of 3 mmHg, the estimated right ventricular systolic pressure is 55.1 mmHg. Left  Atrium: Left atrial size was normal in size. Right Atrium: Right atrial size was normal in size. Pericardium: Trivial pericardial effusion is present. The pericardial effusion is circumferential. Mitral Valve: The mitral valve  is normal in structure. Mild mitral valve regurgitation. No evidence of mitral valve stenosis. MV peak gradient, 7.7 mmHg. The mean mitral valve gradient is 3.0 mmHg. Tricuspid Valve: The tricuspid valve is normal in structure. Tricuspid valve regurgitation is not demonstrated. No evidence of tricuspid stenosis. Aortic Valve: The aortic valve is calcified. There is moderate calcification of the aortic valve. There is moderate aortic valve annular calcification. Aortic valve regurgitation is not visualized. Mild aortic stenosis is present. Aortic valve mean gradient measures 9.3 mmHg. Aortic valve peak gradient measures 21.0 mmHg. Aortic valve area, by VTI measures 1.82 cm. Pulmonic Valve: The pulmonic valve was normal in structure. Pulmonic valve regurgitation is not visualized. No evidence of pulmonic stenosis. Aorta: Abdominal aorta is normal size. The aortic root is normal in size and structure. Venous: The inferior vena cava is normal in size with greater than 50% respiratory variability, suggesting right atrial pressure of 3 mmHg. IAS/Shunts: No atrial level shunt detected by color flow Doppler.  LEFT VENTRICLE PLAX 2D LVIDd:         3.00 cm     Diastology LVIDs:         1.80 cm     LV e' medial:    5.22 cm/s LV PW:         1.10 cm     LV E/e' medial:  20.5 LV IVS:        1.20 cm     LV e' lateral:   7.51 cm/s LVOT diam:     1.80 cm     LV E/e' lateral: 14.2 LV SV:         76 LV SV Index:   43 LVOT Area:     2.54 cm  LV Volumes (MOD) LV vol d, MOD A2C: 72.1 ml LV vol d, MOD A4C: 73.2 ml LV vol s, MOD A2C: 16.4 ml LV vol s, MOD A4C: 21.0 ml LV SV MOD A2C:     55.7 ml LV SV MOD A4C:     73.2 ml LV SV MOD BP:      54.7 ml RIGHT VENTRICLE             IVC RV Basal diam:  3.10 cm     IVC  diam: 1.60 cm RV S prime:     11.90 cm/s TAPSE (M-mode): 2.5 cm RVSP:           55.1 mmHg LEFT ATRIUM             Index        RIGHT ATRIUM           Index LA diam:        2.20 cm 1.24 cm/m   RA Pressure: 3.00 mmHg LA Vol (A2C):   70.7 ml 39.76 ml/m  RA Area:     13.10 cm LA Vol (A4C):   21.1 ml 11.87 ml/m  RA Volume:   24.90 ml  14.00 ml/m LA Biplane Vol: 39.2 ml 22.05 ml/m  AORTIC VALVE                     PULMONIC VALVE AV Area (Vmax):    1.84 cm      PV Vmax:       1.24 m/s AV Area (Vmean):   1.97 cm      PV Peak grad:  6.2 mmHg AV Area (VTI):     1.82 cm AV Vmax:  229.00 cm/s AV Vmean:          141.000 cm/s AV VTI:            0.417 m AV Peak Grad:      21.0 mmHg AV Mean Grad:      9.3 mmHg LVOT Vmax:         166.00 cm/s LVOT Vmean:        109.000 cm/s LVOT VTI:          0.299 m LVOT/AV VTI ratio: 0.72  AORTA Ao Root diam: 3.00 cm Ao Asc diam:  3.30 cm MITRAL VALVE                TRICUSPID VALVE MV Area (PHT): 4.24 cm     TR Peak grad:   52.1 mmHg MV Area VTI:   2.03 cm     TR Vmax:        361.00 cm/s MV Peak grad:  7.7 mmHg     Estimated RAP:  3.00 mmHg MV Mean grad:  3.0 mmHg     RVSP:           55.1 mmHg MV Vmax:       1.39 m/s MV Vmean:      73.1 cm/s    SHUNTS MV Decel Time: 179 msec     Systemic VTI:  0.30 m MR Peak grad: 94.5 mmHg     Systemic Diam: 1.80 cm MR Vmax:      486.00 cm/s MV E velocity: 107.00 cm/s MV A velocity: 106.00 cm/s MV E/A ratio:  1.01 Kardie Tobb DO Electronically signed by Kardie Tobb DO Signature Date/Time: 08/27/2024/11:41:15 AM    Final    Scheduled Meds:  enoxaparin  (LOVENOX ) injection  30 mg Subcutaneous Q24H   methylPREDNISolone  (SOLU-MEDROL ) injection  125 mg Intravenous Daily   polyethylene glycol  17 g Oral BID   senna-docusate  1 tablet Oral BID   Continuous Infusions:  sodium chloride  75 mL/hr at 08/29/24 0343   azithromycin  500 mg (08/29/24 0152)   cefTRIAXone  (ROCEPHIN )  IV 1 g (08/29/24 0109)     LOS: 3 days   Time spent:   Elsie JAYSON [place], DO [facility]  If 7PM-7AM, please contact night-coverage [email or web address]  08/29/2024, 7:27 AM

## 2024-08-29 NOTE — Significant Event (Signed)
 Rapid Response Event Note   Reason for Call :  Increased Oxygen  Needs  Initial Focused Assessment:  Called for increased oxygen  needs, unable to get SpO2 above 88%. Per bedside RN she received 0.5 mg IV dilaudid  x2 along with 5 mg PO oxycodone  overnight. Responds to voice-alert to self and place. Follows commands. Patient in bed, on 15L HFNC. 1+ BLE edema, pulses 2+. Was able to wean to 8L  HFNC prior to transport. SpO2 on monitor 96%, NSR-HR 75 on monitor. BP 102/74.  RT at bedside drawing ABG. Responds to voice without much stimulation.  Lungs diminished Lue MD paged by bedside RN during rapid response.     Interventions:  Transfer to SD  ABG   Event Summary:   MD Notified: Notified by bedside RN Call Time: 0730 Arrival Time: [id number] End Time: 0800  Barnie Clover, RN

## 2024-08-29 NOTE — Progress Notes (Signed)
 eLink Physician-Brief Progress Note [name] DOB: 21-Oct-1941 MRN: [id number]   Date of Service  08/29/2024  HPI/Events of Note  Received request for AM ABG Seen on camera asleep on Precedex 0.5 Discussed with BSRN and apparently did not have to give Zyprexa  eICU Interventions  Ordered AM ABG to monitor while on BiPap     Intervention Category Intermediate Interventions: Communication with other healthcare providers and/or family;Other:  Damien ONEIDA Grout 08/29/2024, 10:49 PM

## 2024-08-29 NOTE — Progress Notes (Signed)
 eLink Physician-Brief Progress Note [name] DOB: Jun 05, 1941 MRN: [id number]   Date of Service  08/29/2024  HPI/Events of Note  Notified of HTN 194/97 (130) HR 63 Not agitated  eICU Interventions  Ordered hydralazine  10 mg IV q 6 prn for SBP > 160     Intervention Category Intermediate Interventions: Hypertension - evaluation and management  Damien ONEIDA Grout 08/29/2024, 11:14 PM

## 2024-08-29 NOTE — Progress Notes (Signed)
 PT Cancellation Note  Patient Details Name: Tammy Ball MRN: [id number] DOB: December 15, 1940   Cancelled Treatment:     PT order received but eval deferred; Pt currently on 13L O2 and transferring to ICU.  Will follow.   Amir Glaus 08/29/2024, 8:27 AM

## 2024-08-30 DIAGNOSIS — J9621 Acute and chronic respiratory failure with hypoxia: Secondary | ICD-10-CM | POA: Diagnosis not present

## 2024-08-30 DIAGNOSIS — I10 Essential (primary) hypertension: Secondary | ICD-10-CM | POA: Diagnosis not present

## 2024-08-30 DIAGNOSIS — F329 Major depressive disorder, single episode, unspecified: Secondary | ICD-10-CM | POA: Diagnosis not present

## 2024-08-30 DIAGNOSIS — S22000A Wedge compression fracture of unspecified thoracic vertebra, initial encounter for closed fracture: Secondary | ICD-10-CM | POA: Diagnosis not present

## 2024-08-30 LAB — BASIC METABOLIC PANEL WITH GFR
Anion gap: 11 (ref 5–15)
BUN: 70 mg/dL — ABNORMAL HIGH (ref 8–23)
CO2: 32 mmol/L (ref 22–32)
Calcium: 9.2 mg/dL (ref 8.9–10.3)
Chloride: 91 mmol/L — ABNORMAL LOW (ref 98–111)
Creatinine, Ser: 1.84 mg/dL — ABNORMAL HIGH (ref 0.44–1.00)
GFR, Estimated: 27 mL/min — ABNORMAL LOW (ref >60)
Glucose, Bld: 105 mg/dL — ABNORMAL HIGH (ref 70–99)
Potassium: 5.2 mmol/L — ABNORMAL HIGH (ref 3.5–5.1)
Sodium: 134 mmol/L — ABNORMAL LOW (ref 135–145)

## 2024-08-30 LAB — BLOOD GAS, ARTERIAL
Acid-Base Excess: 7.1 mmol/L — ABNORMAL HIGH (ref 0.0–2.0)
Bicarbonate: 33.4 mmol/L — ABNORMAL HIGH (ref 20.0–28.0)
Delivery systems: POSITIVE
Drawn by: 23532
Expiratory PAP: 10 cmH2O
FIO2: 0.3 %
Inspiratory PAP: 25 cmH2O
O2 Saturation: 97.4 %
Patient temperature: 37
pCO2 arterial: 54 mmHg — ABNORMAL HIGH (ref 32–48)
pH, Arterial: 7.4 (ref 7.35–7.45)
pO2, Arterial: 103 mmHg (ref 83–108)

## 2024-08-30 LAB — CBC
HCT: 35.3 % — ABNORMAL LOW (ref 36.0–46.0)
Hemoglobin: 11.5 g/dL — ABNORMAL LOW (ref 12.0–15.0)
MCH: 31.9 pg (ref 26.0–34.0)
MCHC: 32.6 g/dL (ref 30.0–36.0)
MCV: 97.8 fL (ref 80.0–100.0)
Platelets: 252 K/uL (ref 150–400)
RBC: 3.61 MIL/uL — ABNORMAL LOW (ref 3.87–5.11)
RDW: 14.8 % (ref 11.5–15.5)
WBC: 8.9 K/uL (ref 4.0–10.5)
nRBC: 0.6 % — ABNORMAL HIGH (ref 0.0–0.2)

## 2024-08-30 MED ORDER — CARMEX CLASSIC LIP BALM EX OINT
1.0000 | TOPICAL_OINTMENT | CUTANEOUS | Status: DC | PRN
  Filled 2024-08-30 (×2): qty 10

## 2024-08-30 MED ORDER — BOOST / RESOURCE BREEZE PO LIQD CUSTOM
1.0000 | Freq: Two times a day (BID) | ORAL | Status: DC
  Administered 2024-08-30 – 2024-09-03 (×5): 1 via ORAL

## 2024-08-30 MED ORDER — STERILE WATER FOR INJECTION IJ SOLN
INTRAMUSCULAR | Status: AC
  Administered 2024-08-30: 2.1 mL
  Filled 2024-08-30: qty 10

## 2024-08-30 NOTE — Plan of Care (Signed)
  Problem: Nutrition: Goal: Adequate nutrition will be maintained Outcome: Not Progressing   Problem: Education: Goal: Knowledge of General Education information will improve Description: Including pain rating scale, medication(s)/side effects and non-pharmacologic comfort measures Outcome: Progressing   Problem: Health Behavior/Discharge Planning: Goal: Ability to manage health-related needs will improve Outcome: Progressing   Problem: Clinical Measurements: Goal: Ability to maintain clinical measurements within normal limits will improve Outcome: Progressing Goal: Will remain free from infection Outcome: Progressing Goal: Diagnostic test results will improve Outcome: Progressing Goal: Respiratory complications will improve Outcome: Progressing Goal: Cardiovascular complication will be avoided Outcome: Progressing   Problem: Activity: Goal: Risk for activity intolerance will decrease Outcome: Progressing   Problem: Coping: Goal: Level of anxiety will decrease Outcome: Progressing   Problem: Elimination: Goal: Will not experience complications related to bowel motility Outcome: Progressing Goal: Will not experience complications related to urinary retention Outcome: Progressing   Problem: Pain Managment: Goal: General experience of comfort will improve and/or be controlled Outcome: Progressing   Problem: Safety: Goal: Ability to remain free from injury will improve Outcome: Progressing   Problem: Skin Integrity: Goal: Risk for impaired skin integrity will decrease Outcome: Progressing

## 2024-08-30 NOTE — Progress Notes (Signed)
   08/30/24 0329  BiPAP/CPAP/SIPAP  BiPAP/CPAP/SIPAP Pt Type Adult  BiPAP/CPAP/SIPAP SERVO  Mask Type Full face mask  Mask Size Medium  Set Rate 24 breaths/min  Respiratory Rate 25 breaths/min  IPAP 25 cmH20  EPAP 10 cmH2O  FiO2 (%) 30 %  Minute Ventilation 11.4  Peak Inspiratory Pressure (PIP) 25  Tidal Volume (Vt) 502  Patient Home Machine No  Patient Home Mask No  Patient Home Tubing No  Auto Titrate No  Press High Alarm 35 cmH2O  Device Plugged into RED Power Outlet Yes  BiPAP/CPAP /SiPAP Vitals  Pulse Rate 74  Resp (!) 25  Bilateral Breath Sounds Clear;Diminished  MEWS Score/Color  MEWS Score 2  MEWS Score Color Yellow

## 2024-08-30 NOTE — Progress Notes (Signed)
 Pt not wanting to be placed on bipap at this time. Pt resting comfortably in recliner on a 5L [place]. RN to let RT know if pt decides to get back in bed and be placed on bipap.

## 2024-08-30 NOTE — Plan of Care (Signed)
  Problem: Clinical Measurements: Goal: Ability to maintain clinical measurements within normal limits will improve Outcome: Progressing Goal: Will remain free from infection Outcome: Progressing Goal: Diagnostic test results will improve Outcome: Progressing Goal: Respiratory complications will improve Outcome: Progressing Goal: Cardiovascular complication will be avoided Outcome: Progressing   Problem: Coping: Goal: Level of anxiety will decrease Outcome: Progressing   Problem: Elimination: Goal: Will not experience complications related to urinary retention Outcome: Progressing   Problem: Pain Managment: Goal: General experience of comfort will improve and/or be controlled Outcome: Progressing

## 2024-08-30 NOTE — Consult Note (Signed)
 NAME:  Tammy Ball, MRN:  [id number], DOB:  1941/02/10, LOS: 4 ADMISSION DATE:  08/26/2024, CONSULTATION DATE:  08/29/24 REFERRING MD:  Elsie Lue COME CHIEF COMPLAINT:  Hypercapnic Respiratory Failure   History of Present Illness:  Tammy Ball is an 83 year old woman with history of chronic hyopxemic respiratory failure, COPD, right upper lobe squamous cell lung cancer s/p lobectomy, history of renal cell carcinoma and diastolic heart failure who is admitted for acute on chronic respiratory failure and T7 compression fractures.   She developed progress respiratory failure since admission and transferred to step down unit today for hypercapnic respiratory failure and placed on bipap.   She was last seen 04/29/24 by Dr. Shelah. She is on breztri  inhaler for her COPD regimen.   History obtained from her daughter at bedside. Patient has experience general health decline over the past 8 years. She has been frustrated with the activity level recently as she has not been able to do as much as she would like. She has been in a great deal of pain since her compression fracture which has hindered her breathing. She has also been getting narcotic pain medications.  Pertinent  Medical History   COPD Diastolic Heart Failure Renal cell carcinoma s/p nephrectomy GERD Chronic Respiratory Failure Lung Cancer  Significant Hospital Events: Including procedures, antibiotic start and stop dates in addition to other pertinent events   10/15 admitted 10/18 transferred to stepdown for hypercapnic respiratory failure  Interim History / Subjective:   Some agitation over night, given zyprexa Remained on precedex Has been able to come off bipap this morning, complains of back pain and left sided abdominal pain Some confusion Daughter at bedside  Objective    Blood pressure (!) 177/76, pulse 77, temperature 98.8 F (37.1 C), temperature source Axillary, resp. rate (!) 24, height 5' 3 (1.6 m), weight  78.1 kg, SpO2 97%.    FiO2 (%):  [30 %-40 %] 30 % PEEP:  [7 cmH20-10 cmH20] 10 cmH20   Intake/Output Summary (Last 24 hours) at 08/30/2024 0736 Last data filed at 08/30/2024 0645 Gross per 24 hour  Intake 2579.25 ml  Output 1025 ml  Net 1554.25 ml   Filed Weights   08/26/24 1814 08/26/24 2340 08/29/24 0806  Weight: 73 kg 74.5 kg 78.1 kg    Examination: General: elderly woman, laying in bed, no distress HENT: [place]/AT, dry mucous membranes Lungs: diminished breath sounds, no wheezing Cardiovascular: rrr, no murmurs Abdomen: soft, non-tender, non-distended, BS+ Extremities: warm, no edema Neuro: moving extremities, awake, alert, following commands GU: n/a  Resolved problem list   Assessment and Plan   Acute on Chronic Hypercapnic and Hypoxemic Respiratory Failure COPD with acute exacerbation Hx of Lung Cancer s/p RUL Lobectomy Chronic Diastolic Heart Failure T7 Compression Fracture Severe Pain   Plan - Continue supplemental oxygen  for goal SpO2 88-92% - Continue bipap as needed during the day, recommend using at night - continue yupelri and brovana nebs - continue solumedrol dose to 40mg  IV daily for 5 days - agree with ceftriaxone  and azithromycin  for CAP coverage - continue low dose precedex for comfort - avoid narcotics as able, difficult with severe pain - Will continue to monitor  Labs   CBC: Recent Labs  Lab 08/26/24 1906 08/27/24 0005 08/27/24 0633 08/28/24 0412 08/30/24 0250  WBC 11.6* 13.7* 14.2* 11.7* 8.9  NEUTROABS 9.8*  --   --  10.6*  --   HGB 12.7 12.7 12.3 11.6* 11.5*  HCT 39.1 39.5 39.9 38.1 35.3*  MCV 97.0 98.8 100.3* 100.5* 97.8  PLT 236 240 231 237 252    Basic Metabolic Panel: Recent Labs  Lab 08/28/24 1848 08/29/24 0031 08/29/24 0720 08/29/24 1215 08/30/24 0250  NA 130* 129* 131* 130* 134*  K 5.2* 5.4* 5.3* 5.4* 5.2*  CL 86* 87* 89* 89* 91*  CO2 36* 36* 34* 29 32  GLUCOSE 125* 127* 127* 102* 105*  BUN 52* 56* 58* 63* 70*   CREATININE 1.63* 1.90* 1.83* 1.79* 1.84*  CALCIUM  9.1 9.2 8.9 8.8* 9.2   GFR: Estimated Creatinine Clearance: 24.1 mL/min (A) (by C-G formula based on SCr of 1.84 mg/dL (H)). Recent Labs  Lab 08/27/24 0005 08/27/24 0633 08/28/24 0412 08/30/24 0250  WBC 13.7* 14.2* 11.7* 8.9    Liver Function Tests: Recent Labs  Lab 08/26/24 1906 08/27/24 0633  AST 25 20  ALT 16 15  ALKPHOS 81 76  BILITOT 0.6 0.4  PROT 6.7 6.5  ALBUMIN  4.2 3.9   No results for input(s): LIPASE, AMYLASE in the last 168 hours.  No results for input(s): AMMONIA in the last 168 hours.  ABG    Component Value Date/Time   PHART 7.4 08/30/2024 0506   PCO2ART 54 (H) 08/30/2024 0506   PO2ART 103 08/30/2024 0506   HCO3 33.4 (H) 08/30/2024 0506   TCO2 31 01/19/2016 0424   ACIDBASEDEF 1.0 01/18/2016 1120   O2SAT 97.4 08/30/2024 0506     Coagulation Profile: Recent Labs  Lab 08/28/24 0412  INR 0.9    Cardiac Enzymes: No results for input(s): CKTOTAL, CKMB, CKMBINDEX, TROPONINI in the last 168 hours.  HbA1C: No results found for: HGBA1C  CBG: Recent Labs  Lab 08/26/24 2038  GLUCAP 145*    Critical care time: 32 minutes    CRITICAL CARE Performed by: Dorn KATHEE Chill   Total critical care time: 32 minutes  Critical care time was exclusive of separately billable procedures and treating other patients.  Critical care was necessary to treat or prevent imminent or life-threatening deterioration.  Critical care was time spent personally by me on the following activities: development of treatment plan with patient and/or surrogate as well as nursing, discussions with consultants, evaluation of patient's response to treatment, examination of patient, obtaining history from patient or surrogate, ordering and performing treatments and interventions, ordering and review of laboratory studies, ordering and review of radiographic studies, pulse oximetry, re-evaluation of patient's  condition and participation in multidisciplinary rounds.  Dorn Chill, MD [facility] Office: [phone]   See Amion for personal pager PCCM on call pager [phone] until 7pm. Please call Elink 7p-7a. [phone]

## 2024-08-30 NOTE — Progress Notes (Signed)
 PROGRESS NOTE    Tammy Ball  FMW:[id number] DOB: [birth year] DOA: 08/26/2024 PCP: Purcell [name], MD   Brief Narrative:  Tammy Ball is a 83 y.o. female with medical history significant of thoracic spinal fracture involving T7 that was recently diagnosed, history of lung cancer status post lobectomy and chemotherapy, history of renal cell carcinoma, chemotherapy induced neuropathy, diastolic dysfunction CHF, COPD with chronic respiratory failure on 2 L of oxygen  at home, depression, essential hypertension, hyperlipidemia, coronary artery disease, radiation induced neuropathy, history of left nephrectomy who has been following up with PCP as well as emergency room after significant back pain that started about 2 weeks ago. Patiently as above with acute onset back pain, imaging consistent with wedge fracture, hospitalist called for admission.  Assessment & Plan: Principal Problem:   Acute on chronic respiratory failure (HCC) Active Problems:   Essential hypertension   COPD GOLD B-C   Renal cell cancer (HCC)   Depression   Hyperlipidemia   Non-small cell carcinoma of right lung, stage 2 (HCC)   S/P lobectomy of lung   Age-related osteoporosis with current pathological fracture   Stage 3a chronic kidney disease (HCC)   Chronic diastolic CHF (congestive heart failure) (HCC)   Hyponatremia   Closed compression fracture of thoracic vertebra (HCC)   Acute on chronic hypoxic hypercarbic respiratory failure, multifactorial, POA:  - Given acuity of hypoxia suspect this is most notably affected by her recent compression fracture - Continue to titrate oxygen  as appropriate (on 2 L nasal cannula at home at baseline) - currently on 5 L HFNC/BiPAP - Likely compression atelectasis given fracture - Concurrent history of heart failure and COPD less likely playing a role given above -no improvement with diuretics  Hyperkalemia - Lokelma previously given, downtrending appropriately, 5.2  currently continue to monitor  Acute T7 compression fracture, POA With respiratory compromise - Reports no trauma or falls, unclear etiology - Interventional radiology to evaluate for possible kyphoplasty - scheduled for 08/31/24 - Pain moderately well-controlled on current regimen including Precedex, oxycodone  and IV dilaudid  - Borderline hypotension on Precedex, titrating as appropriate  Acute hospital delirium in the setting of narcotics - Mental status continues to wax and wane -most notably around narcotics - Hold IV narcotics unless absolutely necessary - Family indicates patient poorly tolerates morphine  - mild improvement with IV dilaudid   Hypotension - Likely multifactorial in setting of poor p.o. intake, narcotics and ongoing home regimen - Carvedilol  discontinued, IVF overnight after bolus - BP improved today after fluids  Acute heart failure exacerbation unlikely  - Repeat echo unchanged from 2023, EF 60-65% with normal LV function, no regional wall motion abnormalities with grade 2 diastolic dysfunction.  - Furosemide  on hold given hypotension and likely hypovolemia  Rule out pneumonia versus compression atelectasis  - Empiric treatment with IV antibiotics completed - no fevers, cough, sputum production with only minimally elevated WBC(now resolved) initially in the setting of acute stress/pain.  Hyperlipidemia: Statin   Essential hypertension: Continue carvedilol ; hold other home medications - BP well controlled while on narcotics may avoid home regimen to avoid hypotension  History of lung cancer: In remission, history of chemo and radiation  Renal cell carcinoma: Status post nephrectomy  Chronic kidney disease stage IIIa: Appears to be near baseline  Hyponatremia: In the setting of hypovolemia, improving with fluids Osteoporosis: Recommend further bone studies including bone density studies/repeat imaging in the outpatient setting as appropriate.  Hypercalcemia: Not  clinically relevant -currently back to baseline/within normal limits  DVT prophylaxis: enoxaparin  (LOVENOX ) injection 30 mg Start: 08/28/24 1000 SCDs Start: 08/26/24 2253 Code Status:   Code Status: Full Code Family Communication: Daughter at bedside  Status is: Inpatient  Dispo: The patient is from: Home              Anticipated d/c is to: Home              Anticipated d/c date is: 72+ hours              Patient currently not medically stable for discharge  Consultants:  Interventional radiology  Procedures:  Tentatively scheduled kyphoplasty 08/31/2024  Antimicrobials:  Azithromycin , ceftriaxone  completed  Subjective: No acute issues or events overnight, patient having episodes of agitation and borderline hypotension while on Precedex, Zyprexa given overnight with moderate improvement in symptoms.  Objective: Vitals:   08/30/24 0450 08/30/24 0500 08/30/24 0600 08/30/24 0630  BP: (!) 155/71 (!) 158/68 (!) 177/76   Pulse: 61 (!) 51 (!) 51 77  Resp: (!) 21 (!) 24 (!) 21 (!) 24  Temp:      TempSrc:      SpO2: 90% 96% 97% 97%  Weight:      Height:        Intake/Output Summary (Last 24 hours) at 08/30/2024 0716 Last data filed at 08/30/2024 0645 Gross per 24 hour  Intake 2579.25 ml  Output 1025 ml  Net 1554.25 ml   Filed Weights   08/26/24 1814 08/26/24 2340 08/29/24 0806  Weight: 73 kg 74.5 kg 78.1 kg    Examination:  General: No acute distress, off BiPAP this morning on nasal cannula alert and oriented to person place and situation HEENT:  Normocephalic atraumatic.  Sclerae nonicteric, noninjected.  Extraocular movements intact bilaterally. Neck:  Without mass or deformity.  Trachea is midline. Lungs: Diminished bilaterally with poor chest wall expansion without overt rales wheeze or rhonchi. Heart:  Regular rate and rhythm.  Without murmurs, rubs, or gallops. Abdomen:  Soft, nontender, nondistended.  Without guarding or rebound. Extremities: Without cyanosis,  clubbing, edema, or obvious deformity  Data Reviewed: I have personally reviewed following labs and imaging studies  CBC: Recent Labs  Lab 08/26/24 1906 08/27/24 0005 08/27/24 0633 08/28/24 0412 08/30/24 0250  WBC 11.6* 13.7* 14.2* 11.7* 8.9  NEUTROABS 9.8*  --   --  10.6*  --   HGB 12.7 12.7 12.3 11.6* 11.5*  HCT 39.1 39.5 39.9 38.1 35.3*  MCV 97.0 98.8 100.3* 100.5* 97.8  PLT 236 240 231 237 252   Basic Metabolic Panel: Recent Labs  Lab 08/28/24 1848 08/29/24 0031 08/29/24 0720 08/29/24 1215 08/30/24 0250  NA 130* 129* 131* 130* 134*  K 5.2* 5.4* 5.3* 5.4* 5.2*  CL 86* 87* 89* 89* 91*  CO2 36* 36* 34* 29 32  GLUCOSE 125* 127* 127* 102* 105*  BUN 52* 56* 58* 63* 70*  CREATININE 1.63* 1.90* 1.83* 1.79* 1.84*  CALCIUM  9.1 9.2 8.9 8.8* 9.2   GFR: Estimated Creatinine Clearance: 24.1 mL/min (A) (by C-G formula based on SCr of 1.84 mg/dL (H)).  Liver Function Tests: Recent Labs  Lab 08/26/24 1906 08/27/24 0633  AST 25 20  ALT 16 15  ALKPHOS 81 76  BILITOT 0.6 0.4  PROT 6.7 6.5  ALBUMIN  4.2 3.9   BNP (last 3 results) Recent Labs    08/26/24 1906  PROBNP 1,699.0*   CBG: Recent Labs  Lab 08/26/24 2038  GLUCAP 145*    Recent Results (from the past 240 hours)  Blood culture (routine x 2)     Status: None (Preliminary result)   Collection Time: 08/26/24 10:10 PM   Specimen: BLOOD RIGHT ARM  Result Value Ref Range Status   Specimen Description   Final    BLOOD RIGHT ARM Performed at [facility], [address], [place], [place] [address]    Special Requests   Final    Blood Culture results may not be optimal due to an inadequate volume of blood received in culture bottles BOTTLES DRAWN AEROBIC AND ANAEROBIC Performed at [facility], [address], [place], [place] [address]    Culture   Final    NO GROWTH 2 DAYS Performed at [facility], [address] [place], [place] [address]    Report Status PENDING   Incomplete  Blood culture (routine x 2)     Status: None (Preliminary result)   Collection Time: 08/26/24 10:15 PM   Specimen: BLOOD  Result Value Ref Range Status   Specimen Description   Final    BLOOD RIGHT ANTECUBITAL Performed at [facility], [address], [place], [place] [address]    Special Requests   Final    Blood Culture results may not be optimal due to an inadequate volume of blood received in culture bottles BOTTLES DRAWN AEROBIC AND ANAEROBIC Performed at [facility], [address], [place], [place] [address]    Culture   Final    NO GROWTH 2 DAYS Performed at [facility], [address], [place], [place] [address]    Report Status PENDING  Incomplete  MRSA Next Gen by PCR, Nasal     Status: None   Collection Time: 08/29/24  8:20 AM   Specimen: Nasal Mucosa; Nasal Swab  Result Value Ref Range Status   MRSA by PCR Next Gen NOT DETECTED NOT DETECTED Final    Comment: (NOTE) The GeneXpert MRSA Assay (FDA approved for NASAL specimens only), is one component of a comprehensive MRSA colonization surveillance program. It is not intended to diagnose MRSA infection nor to guide or monitor treatment for MRSA infections. Test performance is not FDA approved in patients less than 34 years old. Performed at [facility], [address], [place], [place] [address]       Radiology Studies: DG CHEST PORT 1 VIEW Result Date: 08/29/2024 CLINICAL DATA:  Shortness of breath. EXAM: PORTABLE CHEST 1 VIEW COMPARISON:  08/26/2024 FINDINGS: The cardio pericardial silhouette is enlarged. Interval progression of right base atelectasis/infiltrate and mildly increased small right pleural effusion. Similar collapse/consolidation at the left base. Diffuse interstitial opacity is likely chronic. Changes of upper thoracic vertebral augmentation evident. Telemetry leads overlie the chest. IMPRESSION: 1. Interval progression of  right base atelectasis/infiltrate and mildly increased small right pleural effusion. 2. Similar collapse/consolidation at the left base. Electronically Signed   By: Camellia Candle M.D.   On: 08/29/2024 09:13   Scheduled Meds:  arformoterol  15 mcg Nebulization BID   Chlorhexidine  Gluconate Cloth  6 each Topical Daily   enoxaparin  (LOVENOX ) injection  30 mg Subcutaneous Q24H   lidocaine   1 patch Transdermal Q24H   methylPREDNISolone  (SOLU-MEDROL ) injection  40 mg Intravenous Daily   polyethylene glycol  17 g Oral BID   revefenacin  175 mcg Nebulization Daily   senna-docusate  1 tablet Oral BID   Continuous Infusions:  azithromycin  Stopped (08/29/24 2332)   cefTRIAXone  (ROCEPHIN )  IV Stopped (08/29/24 2208)   dexmedetomidine (  PRECEDEX) IV infusion 0.5 mcg/kg/hr (08/30/24 0350)     LOS: 4 days   Time spent:  Tammy JAYSON Montclair, DO [facility]  If 7PM-7AM, please contact night-coverage [email or web address]  08/30/2024, 7:16 AM

## 2024-08-30 NOTE — Evaluation (Signed)
 Physical Therapy Evaluation Patient Details Name: Tammy Ball MRN: [id number] DOB: 08-05-1941 Today's Date: 08/30/2024  History of Present Illness  Pt admitted from home 2* CHF exacerbation, acute on chronic respiratory failure, PNA, and hyponatremia.  Pt with hx of CHF, COPD, MI, Lung CA s/p lobectomy, renal cell CA s/p nephrectomy, osteoporosis with spinal compression fxs - most recent at T7  Clinical Impression  Pt admitted as above and presenting with functional mobility limitations 2* generalized weakness, balance deficits, and decreased activity tolerance.  Patient will benefit from continued inpatient follow up therapy, <3 hours/day to maximize IND and safety prior to return home with limited assist.         If plan is discharge home, recommend the following: A lot of help with walking and/or transfers;A little help with bathing/dressing/bathroom;Assistance with cooking/housework;Assist for transportation;Help with stairs or ramp for entrance   Can travel by private vehicle   No    Equipment Recommendations None recommended by PT  Recommendations for Other Services       Functional Status Assessment Patient has had a recent decline in their functional status and demonstrates the ability to make significant improvements in function in a reasonable and predictable amount of time.     Precautions / Restrictions Precautions Precautions: Fall Restrictions Weight Bearing Restrictions Per Provider Order: No      Mobility  Bed Mobility Overal bed mobility: Needs Assistance Bed Mobility: Supine to Sit     Supine to sit: HOB elevated, Min assist, Mod assist, +2 for physical assistance, +2 for safety/equipment     General bed mobility comments: INcreased time with HOB elevated, use of bed rail, assist for LE management and to bring trunk to upright    Transfers Overall transfer level: Needs assistance Equipment used: Rolling walker (2 wheels) Transfers: Sit to/from  Stand, Bed to chair/wheelchair/BSC Sit to Stand: Min assist, +2 physical assistance, +2 safety/equipment, From elevated surface   Step pivot transfers: Min assist, +2 physical assistance, +2 safety/equipment, From elevated surface       General transfer comment: Increased time with cues for LE management and use of UEs to self assist. Physical assist to bring wt up and fwd and to balance in standing with RW    Ambulation/Gait Ambulation/Gait assistance: Min assist, +2 physical assistance, +2 safety/equipment       Gait velocity: decr     General Gait Details: Step pvt bed to recliner only  Stairs            Wheelchair Mobility     Tilt Bed    Modified Rankin (Stroke Patients Only)       Balance Overall balance assessment: Needs assistance Sitting-balance support: Feet supported, No upper extremity supported Sitting balance-Leahy Scale: Fair     Standing balance support: Bilateral upper extremity supported Standing balance-Leahy Scale: Poor                               Pertinent Vitals/Pain Pain Assessment Pain Assessment: Faces Faces Pain Scale: Hurts a little bit Pain Location: The front of my back Pain Descriptors / Indicators: Sore Pain Intervention(s): Limited activity within patient's tolerance, Monitored during session    Home Living Family/patient expects to be discharged to:: Private residence Living Arrangements: Alone Available Help at Discharge: Family;Available PRN/intermittently Type of Home: House Home Access: Stairs to enter Entrance Stairs-Rails: Right Entrance Stairs-Number of Steps: 2   Home Layout: Able to live on main  level with bedroom/bathroom Home Equipment: Rolling Walker (2 wheels);Other (comment);Cane - single point (oxygen ) Additional Comments: Pt vague on details of living arrangement - details above from prior admit    Prior Function Prior Level of Function : Patient poor historian/Family not available                      Extremity/Trunk Assessment   Upper Extremity Assessment Upper Extremity Assessment: Generalized weakness    Lower Extremity Assessment Lower Extremity Assessment: Generalized weakness    Cervical / Trunk Assessment Cervical / Trunk Assessment: Kyphotic  Communication   Communication Communication: Impaired Factors Affecting Communication: Reduced clarity of speech    Cognition Arousal: Lethargic (but rousable) Behavior During Therapy: Flat affect   PT - Cognitive impairments: No family/caregiver present to determine baseline                       PT - Cognition Comments: Pt tangential thinking and mumbling speech Following commands: Intact       Cueing Cueing Techniques: Verbal cues, Gestural cues     General Comments      Exercises     Assessment/Plan    PT Assessment Patient needs continued PT services  PT Problem List Decreased strength;Decreased activity tolerance;Decreased balance;Decreased mobility;Decreased knowledge of use of DME;Decreased cognition;Decreased safety awareness;Pain       PT Treatment Interventions DME instruction;Gait training;Stair training;Functional mobility training;Therapeutic activities;Therapeutic exercise;Patient/family education;Balance training    PT Goals (Current goals can be found in the Care Plan section)  Acute Rehab PT Goals Patient Stated Goal: Pt agreeable to participate with PT but no specific goals expressed PT Goal Formulation: With patient Time For Goal Achievement: 09/06/24 Potential to Achieve Goals: Fair    Frequency Min 2X/week     Co-evaluation               AM-PAC PT 6 Clicks Mobility  Outcome Measure Help needed turning from your back to your side while in a flat bed without using bedrails?: A Lot Help needed moving from lying on your back to sitting on the side of a flat bed without using bedrails?: A Lot Help needed moving to and from a bed to a chair  (including a wheelchair)?: A Lot Help needed standing up from a chair using your arms (e.g., wheelchair or bedside chair)?: A Lot Help needed to walk in hospital room?: Total Help needed climbing 3-5 steps with a railing? : Total 6 Click Score: 10    End of Session Equipment Utilized During Treatment: Gait belt;Oxygen  Activity Tolerance: Patient limited by fatigue Patient left: in chair;with call bell/phone within reach;with chair alarm set Nurse Communication: Mobility status PT Visit Diagnosis: Difficulty in walking, not elsewhere classified (R26.2);Muscle weakness (generalized) (M62.81)    Time: 8564-8494 PT Time Calculation (min) (ACUTE ONLY): 30 min   Charges:   PT Evaluation $PT Eval Low Complexity: 1 Low PT Treatments $Therapeutic Activity: 8-22 mins PT General Charges $$ ACUTE PT VISIT: 1 Visit         [facility] PT Acute Rehabilitation Services Office [phone]   Sadako Cegielski 08/30/2024, 5:04 PM

## 2024-08-31 DIAGNOSIS — S22000A Wedge compression fracture of unspecified thoracic vertebra, initial encounter for closed fracture: Secondary | ICD-10-CM | POA: Diagnosis not present

## 2024-08-31 DIAGNOSIS — J9621 Acute and chronic respiratory failure with hypoxia: Secondary | ICD-10-CM | POA: Diagnosis not present

## 2024-08-31 DIAGNOSIS — F329 Major depressive disorder, single episode, unspecified: Secondary | ICD-10-CM | POA: Diagnosis not present

## 2024-08-31 DIAGNOSIS — J9811 Atelectasis: Secondary | ICD-10-CM

## 2024-08-31 DIAGNOSIS — I10 Essential (primary) hypertension: Secondary | ICD-10-CM | POA: Diagnosis not present

## 2024-08-31 LAB — CBC
HCT: 39.9 % (ref 36.0–46.0)
Hemoglobin: 12.6 g/dL (ref 12.0–15.0)
MCH: 30.2 pg (ref 26.0–34.0)
MCHC: 31.6 g/dL (ref 30.0–36.0)
MCV: 95.7 fL (ref 80.0–100.0)
Platelets: 295 K/uL (ref 150–400)
RBC: 4.17 MIL/uL (ref 3.87–5.11)
RDW: 15.5 % (ref 11.5–15.5)
WBC: 8.1 K/uL (ref 4.0–10.5)
nRBC: 0.5 % — ABNORMAL HIGH (ref 0.0–0.2)

## 2024-08-31 LAB — BASIC METABOLIC PANEL WITH GFR
Anion gap: 8 (ref 5–15)
BUN: 61 mg/dL — ABNORMAL HIGH (ref 8–23)
CO2: 34 mmol/L — ABNORMAL HIGH (ref 22–32)
Calcium: 10 mg/dL (ref 8.9–10.3)
Chloride: 98 mmol/L (ref 98–111)
Creatinine, Ser: 1.18 mg/dL — ABNORMAL HIGH (ref 0.44–1.00)
GFR, Estimated: 46 mL/min — ABNORMAL LOW (ref >60)
Glucose, Bld: 139 mg/dL — ABNORMAL HIGH (ref 70–99)
Potassium: 5.2 mmol/L — ABNORMAL HIGH (ref 3.5–5.1)
Sodium: 140 mmol/L (ref 135–145)

## 2024-08-31 LAB — GLUCOSE, CAPILLARY: Glucose-Capillary: 140 mg/dL — ABNORMAL HIGH (ref 70–99)

## 2024-08-31 MED ORDER — FUROSEMIDE 20 MG PO TABS
20.0000 mg | ORAL_TABLET | Freq: Two times a day (BID) | ORAL | Status: DC
  Administered 2024-08-31 – 2024-09-03 (×7): 20 mg via ORAL
  Filled 2024-08-31 (×7): qty 1

## 2024-08-31 MED ORDER — ENOXAPARIN SODIUM 40 MG/0.4ML IJ SOSY
40.0000 mg | PREFILLED_SYRINGE | INTRAMUSCULAR | Status: DC
  Administered 2024-09-02 – 2024-09-03 (×2): 40 mg via SUBCUTANEOUS
  Filled 2024-08-31 (×2): qty 0.4

## 2024-08-31 MED ORDER — ENOXAPARIN SODIUM 40 MG/0.4ML IJ SOSY
40.0000 mg | PREFILLED_SYRINGE | INTRAMUSCULAR | Status: DC
  Administered 2024-08-31: 40 mg via SUBCUTANEOUS
  Filled 2024-08-31: qty 0.4

## 2024-08-31 MED ORDER — IPRATROPIUM-ALBUTEROL 0.5-2.5 (3) MG/3ML IN SOLN: 3.0000 mL | RESPIRATORY_TRACT | Status: DC | PRN

## 2024-08-31 NOTE — Progress Notes (Addendum)
 PROGRESS NOTE    Tammy Ball  FMW:[id number] DOB: Aug 10, 1941 DOA: 08/26/2024 PCP: Purcell [name], MD   Brief Narrative:  Tammy Ball is a 83 y.o. female with medical history significant of thoracic spinal fracture involving T7 that was recently diagnosed, history of lung cancer status post lobectomy and chemotherapy, history of renal cell carcinoma, chemotherapy induced neuropathy, diastolic dysfunction CHF, COPD with chronic respiratory failure on 2 L of oxygen  at home, depression, essential hypertension, hyperlipidemia, coronary artery disease, radiation induced neuropathy, history of left nephrectomy who has been following up with PCP as well as emergency room after significant back pain that started about 2 weeks ago. Patiently as above with acute onset back pain, imaging consistent with wedge fracture, hospitalist called for admission.  Assessment & Plan: Principal Problem:   Acute on chronic respiratory failure (HCC) Active Problems:   Essential hypertension   COPD GOLD B-C   Renal cell cancer (HCC)   Depression   Hyperlipidemia   Non-small cell carcinoma of right lung, stage 2 (HCC)   S/P lobectomy of lung   Age-related osteoporosis with current pathological fracture   Stage 3a chronic kidney disease (HCC)   Chronic diastolic CHF (congestive heart failure) (HCC)   Hyponatremia   Closed compression fracture of thoracic vertebra (HCC)   Acute on chronic hypoxic hypercarbic respiratory failure, multifactorial, POA:  - Given acuity of hypoxia suspect this is most notably affected by her recent compression fracture - Continue to titrate oxygen  as appropriate (on 2 L nasal cannula at home at baseline) - Likely compression atelectasis given fracture vs pneumonia given lack of symptoms otherwise - Concurrent history of heart failure and COPD less likely playing a role given above -no improvement with diuretics  Hyperkalemia - Lokelma previously given, downtrending  appropriately, 5.2 currently continue to monitor  Acute T7 compression fracture, POA With respiratory compromise - Reports no trauma or falls, unclear etiology - Interventional radiology to evaluate for possible kyphoplasty - Previously scheduled for 08/31/24 - now being postponed due to hypoxia - Pain moderately well-controlled on current regimen including Precedex, oxycodone  and IV dilaudid  - Borderline hypotension on Precedex resolved as medication is weaned down.  Acute hospital delirium in the setting of narcotics - Mental status continues to wax and wane -most notably around narcotics - Hold IV narcotics unless absolutely necessary - Family indicates patient poorly tolerates morphine  - mild improvement with IV dilaudid   Hypotension - Likely multifactorial in setting of poor p.o. intake, narcotics and ongoing home regimen - Carvedilol  discontinued, IVF overnight after bolus - BP improved today after fluids  Acute heart failure exacerbation unlikely  - Repeat echo unchanged from 2023, EF 60-65% with normal LV function, no regional wall motion abnormalities with grade 2 diastolic dysfunction.  - Resume PO furosemide  - patient without overt volume overload  Compression atelectasis  Pneumonia unlikely - Empiric treatment with IV antibiotics completed - no fevers, cough, sputum production with only minimally elevated WBC(now resolved) initially in the setting of acute stress/pain.  Hyperlipidemia: Statin   Essential hypertension: Continue carvedilol ; hold other home medications - BP well controlled while on narcotics may avoid home regimen to avoid hypotension  History of lung cancer: In remission, history of chemo and radiation  Renal cell carcinoma: Status post nephrectomy  Chronic kidney disease stage IIIa: Appears to be near baseline  Hyponatremia: In the setting of hypovolemia, improving Osteoporosis: Recommend further bone studies including bone density studies/repeat imaging  in the outpatient setting as appropriate.  Hypercalcemia: Not  clinically relevant -currently back to baseline/within normal limits  DVT prophylaxis: enoxaparin  (LOVENOX ) injection 30 mg Start: 08/28/24 1000 SCDs Start: 08/26/24 2253 Code Status:   Code Status: Full Code Family Communication: Daughter at bedside  Status is: Inpatient  Dispo: The patient is from: Home              Anticipated d/c is to: Home              Anticipated d/c date is: 72+ hours              Patient currently not medically stable for discharge  Consultants:  Interventional radiology  Procedures:  Tentatively scheduled kyphoplasty 08/31/2024  Antimicrobials:  Azithromycin , ceftriaxone  completed  Subjective: No acute issues or events overnight, patient having episodes of agitation and borderline hypotension while on Precedex, Zyprexa given overnight with moderate improvement in symptoms.  Objective: Vitals:   08/31/24 0000 08/31/24 0100 08/31/24 0200 08/31/24 0400  BP: (!) 171/89 (!) 166/107 (!) 160/89   Pulse: 96 94 93   Resp: 18 (!) 26 17   Temp:    97.8 F (36.6 C)  TempSrc:    Oral  SpO2: 99% 99% 98%   Weight:      Height:        Intake/Output Summary (Last 24 hours) at 08/31/2024 0712 Last data filed at 08/31/2024 0105 Gross per 24 hour  Intake 799.59 ml  Output 550 ml  Net 249.59 ml   Filed Weights   08/26/24 1814 08/26/24 2340 08/29/24 0806  Weight: 73 kg 74.5 kg 78.1 kg    Examination:  General: No acute distress, off BiPAP this morning on nasal cannula alert and oriented to person place and situation HEENT:  Normocephalic atraumatic.  Sclerae nonicteric, noninjected.  Extraocular movements intact bilaterally. Neck:  Without mass or deformity.  Trachea is midline. Lungs: Diminished bilaterally with poor chest wall expansion without overt rales wheeze or rhonchi. Heart:  Regular rate and rhythm.  Without murmurs, rubs, or gallops. Abdomen:  Soft, nontender, nondistended.   Without guarding or rebound. Extremities: Without cyanosis, clubbing, edema, or obvious deformity  Data Reviewed: I have personally reviewed following labs and imaging studies  CBC: Recent Labs  Lab 08/26/24 1906 08/27/24 0005 08/27/24 9366 08/28/24 0412 08/30/24 0250 08/31/24 0239  WBC 11.6* 13.7* 14.2* 11.7* 8.9 8.1  NEUTROABS 9.8*  --   --  10.6*  --   --   HGB 12.7 12.7 12.3 11.6* 11.5* 12.6  HCT 39.1 39.5 39.9 38.1 35.3* 39.9  MCV 97.0 98.8 100.3* 100.5* 97.8 95.7  PLT 236 240 231 237 252 295   Basic Metabolic Panel: Recent Labs  Lab 08/29/24 0031 08/29/24 0720 08/29/24 1215 08/30/24 0250 08/31/24 0239  NA 129* 131* 130* 134* 140  K 5.4* 5.3* 5.4* 5.2* 5.2*  CL 87* 89* 89* 91* 98  CO2 36* 34* 29 32 34*  GLUCOSE 127* 127* 102* 105* 139*  BUN 56* 58* 63* 70* 61*  CREATININE 1.90* 1.83* 1.79* 1.84* 1.18*  CALCIUM  9.2 8.9 8.8* 9.2 10.0   GFR: Estimated Creatinine Clearance: 37.6 mL/min (A) (by C-G formula based on SCr of 1.18 mg/dL (H)).  Liver Function Tests: Recent Labs  Lab 08/26/24 1906 08/27/24 0633  AST 25 20  ALT 16 15  ALKPHOS 81 76  BILITOT 0.6 0.4  PROT 6.7 6.5  ALBUMIN  4.2 3.9   BNP (last 3 results) Recent Labs    08/26/24 1906  PROBNP 1,699.0*   CBG: Recent Labs  Lab 08/26/24 2038  GLUCAP 145*    Recent Results (from the past 240 hours)  Blood culture (routine x 2)     Status: None (Preliminary result)   Collection Time: 08/26/24 10:10 PM   Specimen: BLOOD RIGHT ARM  Result Value Ref Range Status   Specimen Description   Final    BLOOD RIGHT ARM Performed at [facility], [address], [place], [place] [address]    Special Requests   Final    Blood Culture results may not be optimal due to an inadequate volume of blood received in culture bottles BOTTLES DRAWN AEROBIC AND ANAEROBIC Performed at [facility], [address], [place], [place] [address]    Culture   Final    NO GROWTH 4  DAYS Performed at [facility], [address] [place], [place] [address]    Report Status PENDING  Incomplete  Blood culture (routine x 2)     Status: None (Preliminary result)   Collection Time: 08/26/24 10:15 PM   Specimen: BLOOD  Result Value Ref Range Status   Specimen Description   Final    BLOOD RIGHT ANTECUBITAL Performed at [facility], [address] [place], [place] [address]    Special Requests   Final    Blood Culture results may not be optimal due to an inadequate volume of blood received in culture bottles BOTTLES DRAWN AEROBIC AND ANAEROBIC Performed at [facility], [address], [place], [place] [address]    Culture   Final    NO GROWTH 4 DAYS Performed at [facility], [address] [place], [place] [address]    Report Status PENDING  Incomplete  MRSA Next Gen by PCR, Nasal     Status: None   Collection Time: 08/29/24  8:20 AM   Specimen: Nasal Mucosa; Nasal Swab  Result Value Ref Range Status   MRSA by PCR Next Gen NOT DETECTED NOT DETECTED Final    Comment: (NOTE) The GeneXpert MRSA Assay (FDA approved for NASAL specimens only), is one component of a comprehensive MRSA colonization surveillance program. It is not intended to diagnose MRSA infection nor to guide or monitor treatment for MRSA infections. Test performance is not FDA approved in patients less than 37 years old. Performed at [facility], [address], [place], [place] [address]       Radiology Studies: DG CHEST PORT 1 VIEW Result Date: 08/29/2024 CLINICAL DATA:  Shortness of breath. EXAM: PORTABLE CHEST 1 VIEW COMPARISON:  08/26/2024 FINDINGS: The cardio pericardial silhouette is enlarged. Interval progression of right base atelectasis/infiltrate and mildly increased small right pleural effusion. Similar collapse/consolidation at the left base. Diffuse interstitial opacity is likely chronic. Changes of upper  thoracic vertebral augmentation evident. Telemetry leads overlie the chest. IMPRESSION: 1. Interval progression of right base atelectasis/infiltrate and mildly increased small right pleural effusion. 2. Similar collapse/consolidation at the left base. Electronically Signed   By: Camellia Candle M.D.   On: 08/29/2024 09:13   Scheduled Meds:  arformoterol  15 mcg Nebulization BID   Chlorhexidine  Gluconate Cloth  6 each Topical Daily   enoxaparin  (LOVENOX ) injection  30 mg Subcutaneous Q24H   feeding supplement  1 Container Oral BID BM   lidocaine   1 patch Transdermal Q24H   methylPREDNISolone  (SOLU-MEDROL ) injection  40 mg Intravenous Daily   polyethylene glycol  17 g Oral BID   revefenacin  175 mcg Nebulization Daily  senna-docusate  1 tablet Oral BID   Continuous Infusions:  azithromycin  Stopped (08/31/24 0031)   cefTRIAXone  (ROCEPHIN )  IV Stopped (08/30/24 2320)   dexmedetomidine (PRECEDEX) IV infusion 0.5 mcg/kg/hr (08/31/24 9378)     LOS: 5 days   Time spent:  Elsie JAYSON Montclair, DO [facility]  If 7PM-7AM, please contact night-coverage [email or web address]  08/31/2024, 7:12 AM

## 2024-08-31 NOTE — NC FL2 (Signed)
 [place]  MEDICAID FL2 LEVEL OF CARE FORM     IDENTIFICATION  [name] Birthdate: 1941-05-01 Sex: female Admission Date (Current Location): 08/26/2024  [facility] and [place] Number:  Producer, television/film/video and Address:  [facility],  [address] [place], [place] [address]      Provider Number: [id number]  Attending Physician Name and Address:  Lue Elsie BROCKS, MD  Relative Name and Phone Number:  Chandra Carrier (Daughter)  [phone]    Current Level of Care: Hospital Recommended Level of Care: Skilled Nursing Facility Prior Approval Number:    Date Approved/Denied:   PASRR Number: Pending  Discharge Plan: SNF    Current Diagnoses: Patient Active Problem List   Diagnosis Date Noted   Acute on chronic respiratory failure (HCC) 08/26/2024   Closed compression fracture of thoracic vertebra (HCC) 08/25/2024   Class 1 obesity due to excess calories with serious comorbidity and body mass index (BMI) of 30.0 to 30.9 in adult 03/20/2023   Iron deficiency anemia 11/15/2022   Internal hemorrhoids 11/15/2022   History of myocardial infarction 09/20/2022   Gastroesophageal reflux disease without esophagitis 06/19/2022   Hyponatremia 06/09/2022   Chronic bilateral low back pain without sciatica 09/06/2021   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) 02/21/2021   Recurrent major depressive disorder, in full remission 02/21/2021   Atherosclerosis of aorta 02/21/2021   Chronic diastolic CHF (congestive heart failure) (HCC)    Stage 3a chronic kidney disease (HCC) 11/10/2020   Chronic respiratory failure with hypoxia (HCC) 12/26/2018   Age-related osteoporosis with current pathological fracture 11/21/2018   Renal insufficiency 05/20/2018   History of compression fracture of spine 12/10/2016   Insomnia 05/16/2016   S/P lobectomy of lung 01/18/2016   Chemotherapy-induced neuropathy 11/01/2015   Non-small  cell carcinoma of right lung, stage 2 (HCC) 10/03/2015   Hyperlipidemia 08/14/2013   Spondylolisthesis of lumbar region 07/03/2013   Essential hypertension 12/17/2011   COPD GOLD B-C 12/17/2011   Renal cell cancer (HCC) 12/17/2011   Depression 12/17/2011    Orientation RESPIRATION BLADDER Height & Weight     Self, Place  O2 (3L [place]) Continent, Indwelling catheter Weight: 78.1 kg Height:  5' 3 (160 cm)  BEHAVIORAL SYMPTOMS/MOOD NEUROLOGICAL BOWEL NUTRITION STATUS      Continent Diet (Heart Healthy, Thin Liquids)  AMBULATORY STATUS COMMUNICATION OF NEEDS Skin   Extensive Assist Verbally Normal                       Personal Care Assistance Level of Assistance  Bathing, Feeding, Dressing Bathing Assistance: Limited assistance Feeding assistance: Limited assistance Dressing Assistance: Limited assistance     Functional Limitations Info  Sight, Hearing, Speech Sight Info: Adequate Hearing Info: Adequate Speech Info: Adequate    SPECIAL CARE FACTORS FREQUENCY  PT (By licensed PT), OT (By licensed OT)     PT Frequency: 5x/wk OT Frequency: 5x/wk            Contractures Contractures Info: Not present    Additional Factors Info  Code Status, Allergies Code Status Info: FULL Allergies Info: Bee Venom, Amlodipine , Levofloxacin, Alendronate , Hctz (Hydrochlorothiazide )           Current Medications (08/31/2024):  This is the current hospital active medication list Current Facility-Administered Medications  Medication Dose Route Frequency Provider Last Rate Last Admin   arformoterol (BROVANA) nebulizer solution 15 mcg  15 mcg Nebulization BID Kara Dorn NOVAK, MD  15 mcg at 08/31/24 0801   Chlorhexidine  Gluconate Cloth 2 % PADS 6 each  6 each Topical Daily Lue Elsie BROCKS, MD   6 each at 08/30/24 1109   dexmedetomidine (PRECEDEX) 400 MCG/100ML (4 mcg/mL) infusion  0-1.2 mcg/kg/hr Intravenous Titrated Lue Elsie BROCKS, MD 9.76 mL/hr at 08/31/24 0754 0.5  mcg/kg/hr at 08/31/24 0754   enoxaparin  (LOVENOX ) injection 40 mg  40 mg Subcutaneous Q24H Seabron Ronal HERO, RPH   40 mg at 08/31/24 1316   feeding supplement (BOOST / RESOURCE BREEZE) liquid 1 Container  1 Container Oral BID BM Kara Dorn NOVAK, MD   1 Container at 08/31/24 1317   furosemide  (LASIX ) tablet 20 mg  20 mg Oral BID Lue Elsie BROCKS, MD   20 mg at 08/31/24 1316   hydrALAZINE  (APRESOLINE ) injection 10 mg  10 mg Intravenous Q6H PRN Marion Damien DASEN, MD   10 mg at 08/30/24 1237   HYDROmorphone  (DILAUDID ) injection 0.5 mg  0.5 mg Intravenous Q4H PRN Lue Elsie BROCKS, MD   0.5 mg at 08/31/24 9376   ipratropium-albuterol  (DUONEB) 0.5-2.5 (3) MG/3ML nebulizer solution 3 mL  3 mL Nebulization Q2H PRN Lue Elsie BROCKS, MD       lidocaine  (LIDODERM ) 5 % 1 patch  1 patch Transdermal Q24H Lue Elsie BROCKS, MD   1 patch at 08/31/24 1009   lip balm (CARMEX) ointment 1 Application  1 Application Topical PRN Lue Elsie BROCKS, MD       methylPREDNISolone  sodium succinate (SOLU-MEDROL ) 40 mg/mL injection 40 mg  40 mg Intravenous Daily Kara Dorn B, MD   40 mg at 08/31/24 1009   ondansetron  (ZOFRAN ) tablet 4 mg  4 mg Oral Q6H PRN Sim Emery CROME, MD       Or   ondansetron  (ZOFRAN ) injection 4 mg  4 mg Intravenous Q6H PRN Sim Emery CROME, MD   4 mg at 08/27/24 9164   Oral care mouth rinse  15 mL Mouth Rinse PRN Lue Elsie BROCKS, MD       oxyCODONE  (Oxy IR/ROXICODONE ) immediate release tablet 5 mg  5 mg Oral Q4H PRN Lue Elsie BROCKS, MD   5 mg at 08/30/24 9047   polyethylene glycol (MIRALAX  / GLYCOLAX ) packet 17 g  17 g Oral BID Lue Elsie BROCKS, MD   17 g at 08/28/24 2026   revefenacin (YUPELRI) nebulizer solution 175 mcg  175 mcg Nebulization Daily Dewald, Jonathan B, MD   175 mcg at 08/31/24 0803   senna-docusate (Senokot-S) tablet 1 tablet  1 tablet Oral BID Lue Elsie BROCKS, MD   1 tablet at 08/31/24 1316     Discharge Medications: Please see  discharge summary for a list of discharge medications.  Relevant Imaging Results:  Relevant Lab Results:   Additional Information SSN: [id number]  Jon DASEN Anon, RN

## 2024-08-31 NOTE — Evaluation (Signed)
 Occupational Therapy Evaluation Patient Details Name: Tammy Ball MRN: [id number] DOB: [birth year] Today's Date: 08/31/2024   History of Present Illness   Pt admitted from home 2* CHF exacerbation, acute on chronic respiratory failure, PNA, and hyponatremia.  Pt with hx of CHF, COPD, MI, Lung CA s/p lobectomy, renal cell CA s/p nephrectomy, osteoporosis with spinal compression fxs - most recent at T7     Clinical Impressions Patient with no family bedside and limited historian due to recall deficits however patient reports she was living alone and son lives nearby. She was mod I on O2 and used RW and DME for mobility and BADL's. Currently, patient presents with deficits outlined below (see OT Problem List for details) most significantly pain, decreased cognition, balance, activity tolerance on increased supplemental O2 and generalized muscle weakness limiting BADL's and functional mobility performance. Patient will benefit from continued inpatient follow up therapy, <3 hours/day. Patient requires continued Acute care hospital level OT services to progress safety and functional performance and allow for discharge.       If plan is discharge home, recommend the following:   Two people to help with walking and/or transfers;A lot of help with bathing/dressing/bathroom;Assistance with cooking/housework;Direct supervision/assist for medications management;Direct supervision/assist for financial management;Assist for transportation;Help with stairs or ramp for entrance;Supervision due to cognitive status     Functional Status Assessment   Patient has had a recent decline in their functional status and demonstrates the ability to make significant improvements in function in a reasonable and predictable amount of time.     Equipment Recommendations   None recommended by OT      Precautions/Restrictions   Precautions Precautions: Fall Restrictions Weight Bearing Restrictions Per  Provider Order: No     Mobility Bed Mobility Overal bed mobility:  (patient was up in recliner and remained post session)                  Transfers Overall transfer level: Needs assistance Equipment used: Rolling walker (2 wheels) Transfers: Sit to/from Stand Sit to Stand: Min assist, +2 physical assistance, +2 safety/equipment, From elevated surface           General transfer comment: STS x 4, marching x 10 reps with cues for breathing and pacing with O2 destat to 88% on2 ltrs O2      Balance Overall balance assessment: Needs assistance Sitting-balance support: Feet supported, No upper extremity supported Sitting balance-Leahy Scale: Fair     Standing balance support: Bilateral upper extremity supported Standing balance-Leahy Scale: Poor                             ADL either performed or assessed with clinical judgement   ADL Overall ADL's : Needs assistance/impaired Eating/Feeding: Set up;Sitting   Grooming: Wash/dry hands;Wash/dry face;Oral care;Set up;Sitting   Upper Body Bathing: Minimal assistance;Sitting   Lower Body Bathing: Total assistance;Sitting/lateral leans   Upper Body Dressing : Moderate assistance;Sitting   Lower Body Dressing: Total assistance;Sitting/lateral leans   Toilet Transfer: Moderate assistance;BSC/3in1;Minimal assistance   Toileting- Clothing Manipulation and Hygiene: Total assistance Toileting - Clothing Manipulation Details (indicate cue type and reason): has Foley for urine management currently     Functional mobility during ADLs: Minimal assistance;+2 for physical assistance;+2 for safety/equipment;Moderate assistance General ADL Comments: poor activity tolerance due to respiratory and back pain     Vision Baseline Vision/History: 1 Wears glasses;0 No visual deficits  Pertinent Vitals/Pain Pain Assessment Pain Assessment: Faces Faces Pain Scale: Hurts even more Breathing:  normal Negative Vocalization: none Facial Expression: smiling or inexpressive Body Language: tense, distressed pacing, fidgeting Consolability: distracted or reassured by voice/touch PAINAD Score: 2 Pain Location: back Pain Descriptors / Indicators: Aching, Discomfort, Dull, Sore Pain Intervention(s): Limited activity within patient's tolerance, Monitored during session, Premedicated before session, Repositioned     Extremity/Trunk Assessment Upper Extremity Assessment Upper Extremity Assessment: Generalized weakness;Right hand dominant   Lower Extremity Assessment Lower Extremity Assessment: Defer to PT evaluation   Cervical / Trunk Assessment Cervical / Trunk Assessment: Kyphotic   Communication Communication Communication: No apparent difficulties   Cognition Arousal: Alert Behavior During Therapy: WFL for tasks assessed/performed Cognition: Cognition impaired   Orientation impairments: Time Awareness: Intellectual awareness impaired, Online awareness impaired Memory impairment (select all impairments): Short-term memory Attention impairment (select first level of impairment): Sustained attention Executive functioning impairment (select all impairments): Reasoning, Problem solving OT - Cognition Comments: decreased insight                 Following commands: Intact       Cueing  General Comments   Cueing Techniques: Verbal cues;Gestural cues  cues for pacing and breathing strategies, dest to 88% on 2 ltrs with standing and marching chair side then recovered to 92% with cues and rest           Home Living Family/patient expects to be discharged to:: Private residence Living Arrangements: Alone Available Help at Discharge: Family;Available PRN/intermittently Type of Home: House Home Access: Stairs to enter [employer] of Steps: 2 Entrance Stairs-Rails: Right Home Layout: Able to live on main level with bedroom/bathroom     Bathroom  Shower/Tub: Tub/shower unit;Curtain   Bathroom Toilet: Standard Bathroom Accessibility: Yes How Accessible: Accessible via walker Home Equipment: Rolling Walker (2 wheels);Other (comment);Cane - single point;Adaptive equipment;Grab bars - tub/shower;Hand held shower head;Rollator (4 wheels) (O2) Adaptive Equipment: Reacher Additional Comments: may be moving to son's upon d/c      Prior Functioning/Environment Prior Level of Function : Independent/Modified Independent;Driving;Patient poor historian/Family not available             Mobility Comments: rollator and O2 and reportds driving 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, no family bedside and patient inconsistent historian ADLs Comments: indep but son and dtr assist with groceries    OT Problem List: Decreased strength;Decreased activity tolerance;Impaired balance (sitting and/or standing);Decreased cognition;Decreased safety awareness;Decreased knowledge of use of DME or AE;Decreased knowledge of precautions;Cardiopulmonary status limiting activity;Pain   OT Treatment/Interventions: Self-care/ADL training;Therapeutic exercise;Neuromuscular education;Energy conservation;DME and/or AE instruction;Manual therapy;Therapeutic activities;Cognitive remediation/compensation;Patient/family education;Balance training      OT Goals(Current goals can be found in the care plan section)   Acute Rehab OT Goals Patient Stated Goal: to breath better OT Goal Formulation: With patient Time For Goal Achievement: 09/14/24 Potential to Achieve Goals: Fair ADL Goals Pt Will Perform Lower Body Bathing: with contact guard assist;sit to/from stand;with adaptive equipment Pt Will Perform Lower Body Dressing: with min assist;with adaptive equipment;sit to/from stand Pt Will Transfer to Toilet: with contact guard assist;stand pivot transfer;bedside commode Pt Will Perform Toileting - Clothing Manipulation and hygiene: with contact guard  assist;sitting/lateral leans Pt/caregiver will Perform Home Exercise Program: Increased strength;Both right and left upper extremity;With Supervision;With written HEP provided Additional ADL Goal #1: Patient will teach back 3/4 back precautions for poroper body mechanics and decreased back pain with min cues   OT Frequency:  Min 2X/week  AM-PAC OT 6 Clicks Daily Activity     Outcome Measure Help from another person eating meals?: A Little Help from another person taking care of personal grooming?: A Little Help from another person toileting, which includes using toliet, bedpan, or urinal?: Total Help from another person bathing (including washing, rinsing, drying)?: A Lot Help from another person to put on and taking off regular upper body clothing?: A Lot Help from another person to put on and taking off regular lower body clothing?: Total 6 Click Score: 12   End of Session Equipment Utilized During Treatment: Gait belt;Rolling walker (2 wheels);Oxygen  Nurse Communication: Mobility status  Activity Tolerance: Patient limited by pain Patient left: in chair;with call bell/phone within reach;with chair alarm set;with SCD's reapplied  OT Visit Diagnosis: Unsteadiness on feet (R26.81);Muscle weakness (generalized) (M62.81);Cognitive communication deficit (R41.841);Pain Pain - part of body:  (back)                Time: 1533-1550 OT Time Calculation (min): 17 min Charges:  OT General Charges $OT Visit: 1 Visit OT Evaluation $OT Eval Low Complexity: 1 Low  Kataleya Zaugg OT/L Acute Rehabilitation Department  [phone]  08/31/2024, 4:59 PM

## 2024-08-31 NOTE — Plan of Care (Signed)
  Problem: Education: Goal: Knowledge of General Education information will improve Description: Including pain rating scale, medication(s)/side effects and non-pharmacologic comfort measures Outcome: Not Progressing   Problem: Clinical Measurements: Goal: Respiratory complications will improve Outcome: Progressing   

## 2024-08-31 NOTE — Plan of Care (Signed)
  Problem: Clinical Measurements: Goal: Ability to maintain clinical measurements within normal limits will improve Outcome: Progressing Goal: Will remain free from infection Outcome: Progressing Goal: Diagnostic test results will improve Outcome: Progressing Goal: Respiratory complications will improve Outcome: Progressing Goal: Cardiovascular complication will be avoided Outcome: Progressing   Problem: Nutrition: Goal: Adequate nutrition will be maintained Outcome: Progressing   Problem: Coping: Goal: Level of anxiety will decrease Outcome: Progressing   

## 2024-08-31 NOTE — TOC Progression Note (Signed)
 Transition of Care [facility]) - Progression Note    Patient Details  Name: Tammy Ball MRN: [id number] Date of Birth: [birth year]  [facility]) CM/SW Contact  Jon ONEIDA Anon, RN Phone Number: 08/31/2024, 3:50 PM  Clinical Narrative:    PT recommending SNF at DC. NCM spoke with pt daughter Levon via telephone to discuss PT rec for SNF. Pt daughter is in agreement with recommendation. Will need Level II PASRR, awaiting MD to cosign FL2 and 30 Day PASRR note. Referrals sent out for SNF, awaiting bed offers. ICM will continue to follow.     Expected Discharge Plan: Skilled Nursing Facility Barriers to Discharge: Continued Medical Work up               Expected Discharge Plan and Services In-house Referral: NA Discharge Planning Services: CM Consult Post Acute Care Choice: Skilled Nursing Facility Living arrangements for the past 2 months: Single Family Home                 DME Arranged: N/A DME Agency: NA       HH Arranged: NA HH Agency: NA         Social Drivers of Health (SDOH) Interventions SDOH Screenings   Food Insecurity: No Food Insecurity (08/27/2024)  Housing: Low Risk  (08/27/2024)  Transportation Needs: No Transportation Needs (08/27/2024)  Recent Concern: Transportation Needs - Unmet Transportation Needs (06/02/2024)  Utilities: Not At Risk (08/27/2024)  Alcohol  Screen: Low Risk  (06/02/2024)  Depression (PHQ2-9): Medium Risk (08/25/2024)  Financial Resource Strain: Medium Risk (06/02/2024)  Physical Activity: Inactive (06/02/2024)  Social Connections: Socially Isolated (08/27/2024)  Stress: No Stress Concern Present (06/02/2024)  Tobacco Use: Medium Risk (08/26/2024)  Health Literacy: Adequate Health Literacy (06/02/2024)    Readmission Risk Interventions    08/31/2024    3:47 PM 06/11/2022   12:06 PM  Readmission Risk Prevention Plan  Transportation Screening Complete Complete  PCP or Specialist Appt within 3-5 Days Complete  Complete  HRI or Home Care Consult Complete Complete  Social Work Consult for Recovery Care Planning/Counseling Complete Complete  Palliative Care Screening Not Applicable Not Applicable  Medication Review [employer]) Complete Complete

## 2024-08-31 NOTE — Progress Notes (Signed)
 OT Cancellation Note  Patient Details Name: Tammy Ball MRN: [id number] DOB: 1941-05-07   Cancelled Treatment:    Reason Eval/Treat Not Completed: Patient at procedure or test/ unavailable  Nursing reports plan is for OR this day. OT will follow acutely for evaluation.   Miral Hoopes OT/L Acute Rehabilitation Department  [phone]    08/31/2024, 8:55 AM

## 2024-08-31 NOTE — TOC PASRR Note (Signed)
 30 Day PASRR Note   Patient Details  Name: Tammy Ball Date of Birth: 1941-07-16   Transition of Care [facility]) CM/SW Contact:    Jon ONEIDA Anon, RN Phone Number: 08/31/2024, 2:47 PM  To Whom It May Concern:  Please be advised that this patient will require a short-term nursing home stay - anticipated 30 days or less for rehabilitation and strengthening.   The plan is for return home.

## 2024-08-31 NOTE — Progress Notes (Signed)
 NAME:  Tammy Ball, MRN:  [id number], DOB:  Oct 03, 1941, LOS: 5 ADMISSION DATE:  08/26/2024, CONSULTATION DATE:  08/29/24 REFERRING MD:  Elsie Lue COME CHIEF COMPLAINT:  Hypercapnic Respiratory Failure   History of Present Illness:  Tammy Ball is an 83 year old woman with history of chronic hyopxemic respiratory failure, COPD, right upper lobe squamous cell lung cancer s/p lobectomy, history of renal cell carcinoma and diastolic heart failure who is admitted for acute on chronic respiratory failure and T7 compression fractures.   She developed progress respiratory failure since admission and transferred to step down unit today for hypercapnic respiratory failure and placed on bipap.   She was last seen 04/29/24 by Dr. Shelah. She is on breztri  inhaler for her COPD regimen.   History obtained from her daughter at bedside. Patient has experience general health decline over the past 8 years. She has been frustrated with the activity level recently as she has not been able to do as much as she would like. She has been in a great deal of pain since her compression fracture which has hindered her breathing. She has also been getting narcotic pain medications.  Pertinent  Medical History   COPD Diastolic Heart Failure Renal cell carcinoma s/p nephrectomy GERD Chronic Respiratory Failure Lung Cancer  Significant Hospital Events: Including procedures, antibiotic start and stop dates in addition to other pertinent events   10/15 admitted 10/18 transferred to stepdown for hypercapnic respiratory failure  Interim History / Subjective:   Oxygenation seems stable although above reported baseline.  Weaning oxygen  during the day.  Objective    Blood pressure 136/75, pulse 83, temperature 98.6 F (37 C), temperature source Oral, resp. rate (!) 22, height 5' 3 (1.6 m), weight 78.1 kg, SpO2 (!) 87%.        Intake/Output Summary (Last 24 hours) at 08/31/2024 1832 Last data filed at  08/31/2024 1731 Gross per 24 hour  Intake 835.72 ml  Output 2425 ml  Net -1589.28 ml   Filed Weights   08/26/24 1814 08/26/24 2340 08/29/24 0806  Weight: 73 kg 74.5 kg 78.1 kg    Examination: General: Frail elderly woman sitting up in chair HENT: Traumatic normocephalic Lungs: diminished, normal work of breathing Cardiovascular: Regular rate and rhythm, minimal edema Abdomen: Not distended Neuro: Awake alert oriented no focal deficits GU: n/a  Resolved problem list   Assessment and Plan   Acute on Chronic Hypercapnic and Hypoxemic Respiratory Failure COPD with acute exacerbation Hx of Lung Cancer s/p RUL Lobectomy Chronic Diastolic Heart Failure T7 Compression Fracture Severe Pain  Atelectasis, shunt physiology with acute back pain and relative hypoventilation Plan - Continue supplemental oxygen  for goal SpO2 88-92% - Continue bipap as needed during the day, recommend using at night - continue yupelri and brovana nebs - continue solumedrol dose to 40mg  IV, once back to oxygen  baseline recommend 40 mg prednisone  daily for 5 days then 20 mg daily for 5 days then stop - agree with ceftriaxone  and azithromycin  for CAP coverage, total of 5 days, can transition to oral regimen if not completed at time of discharge - continue low dose precedex for comfort, would advocate for weaning this as tolerated given improvement in hypoxemia - avoid narcotics as able, difficult with severe pain  PCCM will sign off.  Labs   CBC: Recent Labs  Lab 08/26/24 1906 08/27/24 0005 08/27/24 9366 08/28/24 0412 08/30/24 0250 08/31/24 0239  WBC 11.6* 13.7* 14.2* 11.7* 8.9 8.1  NEUTROABS 9.8*  --   --  10.6*  --   --   HGB 12.7 12.7 12.3 11.6* 11.5* 12.6  HCT 39.1 39.5 39.9 38.1 35.3* 39.9  MCV 97.0 98.8 100.3* 100.5* 97.8 95.7  PLT 236 240 231 237 252 295    Basic Metabolic Panel: Recent Labs  Lab 08/29/24 0031 08/29/24 0720 08/29/24 1215 08/30/24 0250 08/31/24 0239  NA 129* 131*  130* 134* 140  K 5.4* 5.3* 5.4* 5.2* 5.2*  CL 87* 89* 89* 91* 98  CO2 36* 34* 29 32 34*  GLUCOSE 127* 127* 102* 105* 139*  BUN 56* 58* 63* 70* 61*  CREATININE 1.90* 1.83* 1.79* 1.84* 1.18*  CALCIUM  9.2 8.9 8.8* 9.2 10.0   GFR: Estimated Creatinine Clearance: 37.6 mL/min (A) (by C-G formula based on SCr of 1.18 mg/dL (H)). Recent Labs  Lab 08/27/24 0633 08/28/24 0412 08/30/24 0250 08/31/24 0239  WBC 14.2* 11.7* 8.9 8.1    Liver Function Tests: Recent Labs  Lab 08/26/24 1906 08/27/24 0633  AST 25 20  ALT 16 15  ALKPHOS 81 76  BILITOT 0.6 0.4  PROT 6.7 6.5  ALBUMIN  4.2 3.9   No results for input(s): LIPASE, AMYLASE in the last 168 hours.  No results for input(s): AMMONIA in the last 168 hours.  ABG    Component Value Date/Time   PHART 7.4 08/30/2024 0506   PCO2ART 54 (H) 08/30/2024 0506   PO2ART 103 08/30/2024 0506   HCO3 33.4 (H) 08/30/2024 0506   TCO2 31 01/19/2016 0424   ACIDBASEDEF 1.0 01/18/2016 1120   O2SAT 97.4 08/30/2024 0506     Coagulation Profile: Recent Labs  Lab 08/28/24 0412  INR 0.9    Cardiac Enzymes: No results for input(s): CKTOTAL, CKMB, CKMBINDEX, TROPONINI in the last 168 hours.  HbA1C: No results found for: HGBA1C  CBG: Recent Labs  Lab 08/26/24 2038 08/29/24 0756  GLUCAP 145* 140*    Critical care time:      Tammy JONELLE Beals, MD [facility] Office: [phone] See Amion  PCCM on call pager [phone] until 7pm. Please call Elink 7p-7a. [phone]

## 2024-08-31 NOTE — Progress Notes (Signed)
 Patient ID: Tammy Ball, female   DOB: [birth year], 83 y.o.   MRN: [id number] Pt transferred to ICU/stepdown over weekend secondary to increased oxygen  needs; will plan to hold on T7 KP for now until pt out of ICU; pt will need to be able to lay prone in order to do KP

## 2024-09-01 ENCOUNTER — Inpatient Hospital Stay (HOSPITAL_COMMUNITY)

## 2024-09-01 ENCOUNTER — Other Ambulatory Visit: Payer: Self-pay | Admitting: Radiology

## 2024-09-01 DIAGNOSIS — S22000A Wedge compression fracture of unspecified thoracic vertebra, initial encounter for closed fracture: Secondary | ICD-10-CM | POA: Diagnosis not present

## 2024-09-01 DIAGNOSIS — F329 Major depressive disorder, single episode, unspecified: Secondary | ICD-10-CM | POA: Diagnosis not present

## 2024-09-01 DIAGNOSIS — I10 Essential (primary) hypertension: Secondary | ICD-10-CM | POA: Diagnosis not present

## 2024-09-01 DIAGNOSIS — J9621 Acute and chronic respiratory failure with hypoxia: Secondary | ICD-10-CM | POA: Diagnosis not present

## 2024-09-01 HISTORY — PX: IR KYPHO THORACIC WITH BONE BIOPSY: IMG5518

## 2024-09-01 LAB — CBC
HCT: 40.2 % (ref 36.0–46.0)
Hemoglobin: 13.1 g/dL (ref 12.0–15.0)
MCH: 31.2 pg (ref 26.0–34.0)
MCHC: 32.6 g/dL (ref 30.0–36.0)
MCV: 95.7 fL (ref 80.0–100.0)
Platelets: 263 K/uL (ref 150–400)
RBC: 4.2 MIL/uL (ref 3.87–5.11)
RDW: 15.8 % — ABNORMAL HIGH (ref 11.5–15.5)
WBC: 11.7 K/uL — ABNORMAL HIGH (ref 4.0–10.5)
nRBC: 0 % (ref 0.0–0.2)

## 2024-09-01 LAB — BASIC METABOLIC PANEL WITH GFR
Anion gap: 6 (ref 5–15)
BUN: 47 mg/dL — ABNORMAL HIGH (ref 8–23)
CO2: 36 mmol/L — ABNORMAL HIGH (ref 22–32)
Calcium: 9.7 mg/dL (ref 8.9–10.3)
Chloride: 97 mmol/L — ABNORMAL LOW (ref 98–111)
Creatinine, Ser: 0.89 mg/dL (ref 0.44–1.00)
GFR, Estimated: >60 mL/min (ref >60)
Glucose, Bld: 98 mg/dL (ref 70–99)
Potassium: 5.2 mmol/L — ABNORMAL HIGH (ref 3.5–5.1)
Sodium: 139 mmol/L (ref 135–145)

## 2024-09-01 LAB — CULTURE, BLOOD (ROUTINE X 2): Culture: NO GROWTH

## 2024-09-01 MED ORDER — LIDOCAINE HCL (PF) 1 % IJ SOLN
INTRAMUSCULAR | Status: AC
  Filled 2024-09-01: qty 30

## 2024-09-01 MED ORDER — CEFAZOLIN SODIUM-DEXTROSE 2-4 GM/100ML-% IV SOLN
INTRAVENOUS | Status: AC
  Filled 2024-09-01: qty 100

## 2024-09-01 MED ORDER — FENTANYL CITRATE (PF) 100 MCG/2ML IJ SOLN
INTRAMUSCULAR | Status: AC | PRN
  Administered 2024-09-01 (×2): 50 ug via INTRAVENOUS

## 2024-09-01 MED ORDER — IOHEXOL 300 MG/ML  SOLN
50.0000 mL | Freq: Once | INTRAMUSCULAR | Status: AC | PRN
  Administered 2024-09-01: 50 mL

## 2024-09-01 MED ORDER — BUPIVACAINE HCL (PF) 0.25 % IJ SOLN
30.0000 mL | Freq: Once | INTRAMUSCULAR | Status: AC
Start: 2024-09-01 — End: 2024-09-01
  Administered 2024-09-01: 20 mL

## 2024-09-01 MED ORDER — CEFAZOLIN SODIUM-DEXTROSE 2-4 GM/100ML-% IV SOLN
2.0000 g | INTRAVENOUS | Status: AC
  Administered 2024-09-01: 2 g via INTRAVENOUS

## 2024-09-01 MED ORDER — LIDOCAINE HCL (PF) 1 % IJ SOLN
30.0000 mL | Freq: Once | INTRAMUSCULAR | Status: AC
  Administered 2024-09-01: 20 mL via INTRADERMAL

## 2024-09-01 MED ORDER — MIDAZOLAM HCL 2 MG/2ML IJ SOLN
INTRAMUSCULAR | Status: AC
  Filled 2024-09-01: qty 4

## 2024-09-01 MED ORDER — HYDROMORPHONE HCL 1 MG/ML IJ SOLN
INTRAMUSCULAR | Status: AC
  Filled 2024-09-01: qty 1

## 2024-09-01 MED ORDER — BUPIVACAINE HCL (PF) 0.5 % IJ SOLN
INTRAMUSCULAR | Status: AC
  Filled 2024-09-01: qty 30

## 2024-09-01 MED ORDER — FENTANYL CITRATE (PF) 100 MCG/2ML IJ SOLN
INTRAMUSCULAR | Status: AC
  Filled 2024-09-01: qty 2

## 2024-09-01 MED ORDER — MIDAZOLAM HCL (PF) 2 MG/2ML IJ SOLN
INTRAMUSCULAR | Status: AC | PRN
  Administered 2024-09-01: 1 mg via INTRAVENOUS
  Administered 2024-09-01: .5 mg via INTRAVENOUS

## 2024-09-01 NOTE — Progress Notes (Signed)
 PHARMACY - PHYSICIAN COMMUNICATION CRITICAL VALUE ALERT - BLOOD CULTURE IDENTIFICATION (BCID)  Tammy Ball is an 83 y.o. female who presented to [facility] on 08/26/2024 with a chief complaint of spinal fracture.  Assessment:   Admitted with acute on chronic respiratory failure likely due to acute T7 compression fracture and COPD exacerbation.  She is currently afebrile, WBC WNL.  She completed 5 day course of antibiotics 10/19 PM.  1/4 Blood cx vials from 10/15 is now growing GPR- no BCID is performed on GPR.  This is most likely contaminant.   Name of physician (or Provider) Contacted: JINNY Kipper, NP  Current antibiotics: none; completed 5d course of Rocephin  + Zithromax   Changes to prescribed antibiotics recommended:  None- result is likely contaminant.  Continue to monitor off antibiotics.   Rosaline Millet PharmD 09/01/2024  1:50 AM

## 2024-09-01 NOTE — Progress Notes (Signed)
 Referring Physician(s): Lue ORN  Supervising Physician: Hughes Simmonds  Patient Status:  [facility] Ball - In-pt  Chief Complaint: Back pain, T 7 fracture; referred for T 7 KP   Subjective: Patient doing okay today.  Still has mid back pain worse with movement.  Respiratory status has improved some currently on 2 L nasal cannula.  Denies fever, headache, chest pain, worsening dyspnea, nausea, vomiting or bleeding.   Past Medical History:  Diagnosis Date   Anemia    was while doing chemo   Anxiety    Arthritis    Asthma    Chemotherapy-induced neuropathy 11/01/2015   CHF (congestive heart failure) (HCC)    Claustrophobia    COPD (chronic obstructive pulmonary disease) (HCC)    Depression    Encounter for antineoplastic chemotherapy 02/09/2016   GERD (gastroesophageal reflux disease)    Headache    prior to menopause   Heart murmur    History of echocardiogram    Echo 2/17: EF 60-65%, grade 1 diastolic dysfunction, mild MR, trivial pericardial effusion   History of nuclear stress test    Myoview 2/17: no ischemia or scar, EF 79%; low risk   History of radiation therapy 10/30/16-11/06/16   Hyperlipidemia    Hypertension    Insomnia 05/16/2016   Myocardial infarction (HCC)    Non-small cell carcinoma of right lung, stage 2 (HCC) 10/03/2015   Oxygen  deficiency    Pneumonia    Radiation 02/29/16-04/10/16   50.4 Gy to right central chest   Renal cell carcinoma (HCC)    L nephrectomy  in 2012   Past Surgical History:  Procedure Laterality Date   BACK SURGERY     cervical 1991   BIOPSY  11/15/2022   Procedure: BIOPSY;  Surgeon: San Sandor GAILS, DO;  Location: [facility];  Service: Gastroenterology;;   COLONOSCOPY WITH PROPOFOL  N/A 11/15/2022   Procedure: COLONOSCOPY WITH PROPOFOL ;  Surgeon: San Sandor GAILS, DO;  Location: [facility];  Service: Gastroenterology;  Laterality: N/A;   ESOPHAGOGASTRODUODENOSCOPY (EGD) WITH PROPOFOL  N/A 11/15/2022   Procedure:  ESOPHAGOGASTRODUODENOSCOPY (EGD) WITH PROPOFOL ;  Surgeon: San Sandor GAILS, DO;  Location: [facility];  Service: Gastroenterology;  Laterality: N/A;   EYE SURGERY     IR GENERIC HISTORICAL  01/16/2017   IR RADIOLOGIST EVAL & MGMT 01/16/2017 [facility]   IR GENERIC HISTORICAL  01/31/2017   IR FLUORO GUIDED NEEDLE PLC ASPIRATION/INJECTION LOC 01/31/2017 [name], MD [facility]   IR GENERIC HISTORICAL  01/31/2017   IR VERTEBROPLASTY CERV/THOR BX INC UNI/BIL INC/INJECT/IMAGING 01/31/2017 [name], MD [facility]   IR GENERIC HISTORICAL  01/31/2017   IR VERTEBROPLASTY EA ADDL (T&LS) BX INC UNI/BIL INC INJECT/IMAGING 01/31/2017 [name], MD [facility]   kidney cancer     LOBECTOMY Right    RUL   NEPHRECTOMY  2012   POLYPECTOMY  11/15/2022   Procedure: POLYPECTOMY;  Surgeon: San Sandor GAILS, DO;  Location: [facility];  Service: Gastroenterology;;   SPINE SURGERY     TUBAL LIGATION     VIDEO ASSISTED THORACOSCOPY (VATS)/WEDGE RESECTION Right 01/18/2016   Procedure: VIDEO ASSISTED THORACOSCOPY (VATS)/LUNG RESECTION, THOROCOTOMY, RIGHT UPPER LOBECTOMY, LYMPH NODE DISSECTION, PLACEMENT OF ON Q;  Surgeon: [name], MD;  Location: [facility];  Service: Thoracic;  Laterality: Right;   VIDEO BRONCHOSCOPY Bilateral 09/20/2015   Procedure: VIDEO BRONCHOSCOPY WITHOUT FLUORO;  Surgeon: [name], MD;  Location: [facility];  Service: Cardiopulmonary;  Laterality: Bilateral;   VIDEO BRONCHOSCOPY N/A 01/18/2016   Procedure:  VIDEO BRONCHOSCOPY;  Surgeon: [name], MD;  Location: [facility] OR;  Service: Thoracic;  Laterality: N/A;      Allergies: Bee venom, Amlodipine , Levofloxacin, Alendronate , and Hctz [hydrochlorothiazide ]  Medications: Prior to Admission medications   Medication Sig Start Date End Date Taking? Authorizing Provider  acetaminophen  (TYLENOL ) 500 MG tablet Take 500 mg by mouth every 6 (six) hours as needed for headache or moderate pain  (pain).   Yes [provider]  albuterol  (PROVENTIL ) (2.5 MG/3ML) 0.083% nebulizer solution INHALE 3 ML BY NEBULIZATION EVERY 6 HOURS AS NEEDED FOR WHEEZING OR SHORTNESS OF BREATH 04/30/24  Yes Byrum, Lamar RAMAN, MD  albuterol  (VENTOLIN  HFA) 108 (90 Base) MCG/ACT inhaler Inhale 1-2 puffs into the lungs every 6 (six) hours as needed for wheezing or shortness of breath. 04/29/24  Yes [name], MD  Ascorbic Acid  (VITAMIN C) 1000 MG tablet Take 1,000 mg by mouth every morning.   Yes [provider]  aspirin  EC 81 MG tablet Take 81 mg by mouth every morning.   Yes [provider]  atorvastatin  (LIPITOR) 40 MG tablet TAKE 1 TABLET BY MOUTH EVERY DAY 02/02/24  Yes [name], [name], MD  budesonide -glycopyrrolate -formoterol  (BREZTRI  AEROSPHERE) 160-9-4.8 MCG/ACT AERO inhaler Inhale 2 puffs into the lungs in the morning and at bedtime. 04/29/24  Yes [name], MD  Calcium  Carbonate Antacid (TUMS PO) Take 1 tablet by mouth 4 (four) times daily as needed (heartburn).   Yes [provider]  Carboxymethylcellulose Sodium (ARTIFICIAL TEARS OP) Place 1 drop into both eyes daily as needed (dry eyes).   Yes [provider]  carvedilol  (COREG ) 25 MG tablet Take 1 tablet (25 mg total) by mouth 2 (two) times daily. 04/15/24  Yes [name], [name], NP  cholecalciferol  (VITAMIN D3) 25 MCG (1000 UNIT) tablet Take 1,000 Units by mouth daily.   Yes [provider]  cycloSPORINE (RESTASIS) 0.05 % ophthalmic emulsion Place 1 drop into both eyes See admin instructions. Every 2 hours 08/02/24  Yes [provider]  Guaifenesin  (MUCINEX  MAXIMUM STRENGTH) 1200 MG TB12 Take 1,200 mg by mouth every morning.   Yes [provider]  HYDROcodone -acetaminophen  (NORCO/VICODIN) 5-325 MG tablet Take 1 tablet by mouth every 6 (six) hours as needed. 08/25/24  Yes [name], [name], MD  loperamide  (IMODIUM ) 2 MG capsule Take 1 capsule (2 mg total) by mouth 4 (four)  times daily as needed for diarrhea or loose stools. 08/19/23  Yes [name], [name], MD  losartan  (COZAAR ) 100 MG tablet TAKE 1 TABLET BY MOUTH EVERY DAY 02/17/24  Yes [name], [name], MD  Omega-3 Fatty Acids  (FISH OIL) 1200 MG CAPS Take 1,200 mg by mouth every morning.   Yes [provider]  ondansetron  (ZOFRAN -ODT) 4 MG disintegrating tablet 4mg  ODT q4 hours prn nausea/vomit 08/22/24  Yes Zammit, Joseph, MD  OXYGEN  Inhale 2 L into the lungs as needed (to keep SATS at or above 90).   Yes [provider]  sodium chloride  (OCEAN) 0.65 % SOLN nasal spray Place 1 spray into both nostrils at bedtime as needed for congestion.   Yes [provider]  traMADol  (ULTRAM ) 50 MG tablet Take 1 tablet (50 mg total) by mouth every 6 (six) hours as needed. 08/22/24  Yes Suzette Pac, MD  traZODone  (DESYREL ) 50 MG tablet Take 0.5-1 tablets (25-50 mg total) by mouth at bedtime as needed for sleep. 06/16/24  Yes Purcell [name], MD     Vital Signs: BP (!) 138/53  Pulse 87   Temp 99.2 F (37.3 C) (Oral)   Resp (!) 29   Ht 5' 3 (1.6 m)   Wt 172 lb 3.2 oz (78.1 kg)   SpO2 90%   BMI 30.50 kg/m   Physical Exam: Patient awake, answers simple questions okay.  Chest with distant breath sounds bilaterally.  Heart with normal rate, some occasional ectopy.  Abdomen soft, positive bowel sounds, nontender.  No significant lower extremity edema.  Moderate paravertebral tenderness T7 region.  Imaging: DG CHEST PORT 1 VIEW Result Date: 08/29/2024 CLINICAL DATA:  Shortness of breath. EXAM: PORTABLE CHEST 1 VIEW COMPARISON:  08/26/2024 FINDINGS: The cardio pericardial silhouette is enlarged. Interval progression of right base atelectasis/infiltrate and mildly increased small right pleural effusion. Similar collapse/consolidation at the left base. Diffuse interstitial opacity is likely chronic. Changes of upper thoracic vertebral augmentation evident. Telemetry leads overlie the chest.  IMPRESSION: 1. Interval progression of right base atelectasis/infiltrate and mildly increased small right pleural effusion. 2. Similar collapse/consolidation at the left base. Electronically Signed   By: Camellia Candle M.D.   On: 08/29/2024 09:13    Labs:  CBC: Recent Labs    08/28/24 0412 08/30/24 0250 08/31/24 0239 09/01/24 0728  WBC 11.7* 8.9 8.1 11.7*  HGB 11.6* 11.5* 12.6 13.1  HCT 38.1 35.3* 39.9 40.2  PLT 237 252 295 263    COAGS: Recent Labs    08/28/24 0412  INR 0.9    BMP: Recent Labs    08/29/24 1215 08/30/24 0250 08/31/24 0239 09/01/24 0728  NA 130* 134* 140 139  K 5.4* 5.2* 5.2* 5.2*  CL 89* 91* 98 97*  CO2 29 32 34* 36*  GLUCOSE 102* 105* 139* 98  BUN 63* 70* 61* 47*  CALCIUM  8.8* 9.2 10.0 9.7  CREATININE 1.79* 1.84* 1.18* 0.89  GFRNONAA 28* 27* 46* >60    LIVER FUNCTION TESTS: Recent Labs    03/16/24 0916 08/22/24 1559 08/26/24 1906 08/27/24 0633  BILITOT 0.6 0.9 0.6 0.4  AST 19 21 25 20   ALT 15 13 16 15   ALKPHOS 71 84 81 76  PROT 6.9 6.7 6.7 6.5  ALBUMIN  4.4 4.2 4.2 3.9    Assessment and Plan: Patient with history of acute on chronic hypercapnic and hypoxemic respiratory failure, COPD with acute exacerbation, history of lung cancer with prior right upper lobectomy, chronic diastolic heart failure, back pain secondary to acute T7 compression fracture; now scheduled for image guided T7 kyphoplasty; see original consult from 10/17; labs reviewed; patient examined by Dr. Dr. Hughes and felt stable to undergo above procedure today.Risks and benefits of procedure were discussed with the patient/daughter/POA Levon Code including, but not limited to education regarding the natural healing process of compression fractures without intervention, bleeding, infection, cement migration which may cause spinal cord damage, paralysis, pulmonary embolism or even death.  This interventional procedure involves the use of X-rays and because of the nature of  the planned procedure, it is possible that we will have prolonged use of X-ray fluoroscopy.  Potential radiation risks to you include (but are not limited to) the following: - A slightly elevated risk for cancer  several years later in life. This risk is typically less than 0.5% percent. This risk is low in comparison to the normal incidence of human cancer, which is 33% for women and 50% for men according to the [employer]. - Radiation induced injury can include skin redness, resembling a rash, tissue breakdown / ulcers and hair loss (which can  be temporary or permanent).   The likelihood of either of these occurring depends on the difficulty of the procedure and whether you are sensitive to radiation due to previous procedures, disease, or genetic conditions.   IF your procedure requires a prolonged use of radiation, you will be notified and given written instructions for further action.  It is your responsibility to monitor the irradiated area for the 2 weeks following the procedure and to notify your physician if you are concerned that you have suffered a radiation induced injury.    All of the patient's questions were answered, patient is agreeable to proceed.  Consent signed and in chart.      Electronically Signed: D. Franky Rakers, PA-C 09/01/2024, 1:46 PM   I spent a total of 25 Minutes at the the patient's bedside AND on the patient's hospital floor or unit, greater than 50% of which was counseling/coordinating care for image guided thoracic 7 kyphoplasty    Patient ID: Tammy Ball Smoke, female   DOB: June 07, 1941, 83 y.o.   MRN: [id number]

## 2024-09-01 NOTE — Procedures (Signed)
 Vascular and Interventional Radiology Procedure Note  Patient: DELOISE MARCHANT DOB: [birth year] Medical Record Number: [id number] Note Date/Time: 09/01/24 3:54 PM   Performing Physician: Thom Hall, MD Assistant(s): None  Diagnosis: Symptomatic T7 vertebral body fracture.  Procedure: T7 VERTEBRAL BODY KYPHOPLASTY  Anesthesia: Conscious Sedation Complications: None Estimated Blood Loss: Minimal Specimens: Sent for None  Findings:  Successful Fluoroscopy-guided T7 vertebral body, bipedicular Kyphoplasty. A total of 3 mL PMMA was used. Hemostasis of the tract was achieved using Manual Pressure.  Plan: Bed rest for 2 hours.  See detailed procedure note with images in PACS. The patient tolerated the procedure well without incident or complication and was returned to Recovery in stable condition.    Thom Hall, MD [facility] Rad[facility]   Pager. [phone] Clinic. [phone]

## 2024-09-01 NOTE — Progress Notes (Signed)
 Physical Therapy Treatment Patient Details Name: Tammy Ball MRN: [id number] DOB: February 15, 1941 Today's Date: 09/01/2024   History of Present Illness Pt admitted from home 2* CHF exacerbation, acute on chronic respiratory failure, PNA, and hyponatremia.  Pt with hx of CHF, COPD, MI, Lung CA s/p lobectomy, renal cell CA s/p nephrectomy, osteoporosis with spinal compression fxs - most recent at T7    PT Comments  The patient  assisted to [facility] then to recliner. Patient reports that she Pulled a muscle in her left leg last night when standing, then patient not making any sense describing what happened. Patient is waiting on K/VP. Patient will benefit from continued inpatient follow up therapy, <3 hours/day.     If plan is discharge home, recommend the following: A lot of help with walking and/or transfers;A little help with bathing/dressing/bathroom;Assistance with cooking/housework;Assist for transportation;Help with stairs or ramp for entrance   Can travel by private vehicle        Equipment Recommendations  None recommended by PT    Recommendations for Other Services       Precautions / Restrictions Precautions Precautions: Fall Restrictions Weight Bearing Restrictions Per Provider Order: No     Mobility  Bed Mobility   Bed Mobility: Supine to Sit     Supine to sit: HOB elevated, Min assist     General bed mobility comments: INcreased time with HOB elevated, use of bed rail, assist for LE management and to bring trunk to upright    Transfers Overall transfer level: Needs assistance Equipment used: Rolling walker (2 wheels) Transfers: Sit to/from Stand     Step pivot transfers: From elevated surface, Mod assist       General transfer comment: Steady assist to stand from bed and from [name], pivot steps ,patient supporting on therapist    Ambulation/Gait                   Stairs             Wheelchair Mobility     Tilt Bed    Modified Rankin  (Stroke Patients Only)       Balance Overall balance assessment: Needs assistance Sitting-balance support: Feet supported, No upper extremity supported Sitting balance-Leahy Scale: Fair     Standing balance support: Bilateral upper extremity supported Standing balance-Leahy Scale: Poor                              Communication Communication Communication: No apparent difficulties Factors Affecting Communication: Reduced clarity of speech  Cognition Arousal: Alert Behavior During Therapy: WFL for tasks assessed/performed   PT - Cognitive impairments: No family/caregiver present to determine baseline                       PT - Cognition Comments: Pt tangential thinking , speaking about a machine that legs go,not making sense Following commands: Intact      Cueing Cueing Techniques: Verbal cues, Gestural cues  Exercises      General Comments        Pertinent Vitals/Pain Pain Assessment Faces Pain Scale: Hurts even more Pain Location: back, LLE Pain Descriptors / Indicators: Aching, Discomfort, Dull, Sore Pain Intervention(s): Premedicated before session    Home Living                          Prior Function  PT Goals (current goals can now be found in the care plan section) Progress towards PT goals: Progressing toward goals    Frequency    Min 2X/week      PT Plan      Co-evaluation              AM-PAC PT 6 Clicks Mobility   Outcome Measure  Help needed turning from your back to your side while in a flat bed without using bedrails?: A Lot Help needed moving from lying on your back to sitting on the side of a flat bed without using bedrails?: A Lot Help needed moving to and from a bed to a chair (including a wheelchair)?: A Lot Help needed standing up from a chair using your arms (e.g., wheelchair or bedside chair)?: A Lot Help needed to walk in hospital room?: Total Help needed climbing 3-5 steps  with a railing? : Total 6 Click Score: 10    End of Session Equipment Utilized During Treatment: Gait belt;Oxygen  Activity Tolerance: Patient limited by fatigue Patient left: in chair;with call bell/phone within reach;with chair alarm set Nurse Communication: Mobility status PT Visit Diagnosis: Difficulty in walking, not elsewhere classified (R26.2);Muscle weakness (generalized) (M62.81)     Time: 9044-8974 PT Time Calculation (min) (ACUTE ONLY): 30 min  Charges:    $Therapeutic Activity: 23-37 mins PT General Charges $$ ACUTE PT VISIT: 1 Visit                    Darice Potters PT Acute Rehabilitation Services Office [phone]   Potters Darice Norris 09/01/2024, 12:58 PM

## 2024-09-01 NOTE — Progress Notes (Signed)
 PROGRESS NOTE    Tammy Ball  FMW:[id number] DOB: May 28, 1941 DOA: 08/26/2024 PCP: Purcell [name], MD   Brief Narrative:  Tammy Ball is a 83 y.o. female with medical history significant of thoracic spinal fracture involving T7 that was recently diagnosed, history of lung cancer status post lobectomy and chemotherapy, history of renal cell carcinoma, chemotherapy induced neuropathy, diastolic dysfunction CHF, COPD with chronic respiratory failure on 2 L of oxygen  at home, depression, essential hypertension, hyperlipidemia, coronary artery disease, radiation induced neuropathy, history of left nephrectomy who has been following up with PCP as well as emergency room after significant back pain that started about 2 weeks ago. Patiently as above with acute onset back pain, imaging consistent with wedge fracture, hospitalist called for admission.  Assessment & Plan: Principal Problem:   Acute on chronic respiratory failure (HCC) Active Problems:   Essential hypertension   COPD GOLD B-C   Renal cell cancer (HCC)   Depression   Hyperlipidemia   Non-small cell carcinoma of right lung, stage 2 (HCC)   S/P lobectomy of lung   Age-related osteoporosis with current pathological fracture   Stage 3a chronic kidney disease (HCC)   Chronic diastolic CHF (congestive heart failure) (HCC)   Hyponatremia   Closed compression fracture of thoracic vertebra (HCC)  Acute on chronic hypoxic hypercarbic respiratory failure, multifactorial, POA: Resolving - Given acuity of hypoxia suspect this is most notably affected by her recent compression fracture - Continue to titrate oxygen  as appropriate (on 2 L nasal cannula at home at baseline) - Patient currently satting well on her home 2 L nasal cannula - Likely compression atelectasis given fracture vs pneumonia given lack of symptoms otherwise - Concurrent history of heart failure and COPD less likely playing a role given above -no improvement with  diuretics  Hyperkalemia - Lokelma previously given, downtrending appropriately, 5.2 currently continue to monitor  Acute T7 compression fracture, POA With respiratory compromise - Reports no trauma or falls, unclear etiology - Interventional radiology to evaluate for possible kyphoplasty -  - Previously scheduled for 08/31/24 - now being reevaluated per IR schedule - Pain moderately well-controlled on current regimen including oxycodone  and IV dilaudid  - Borderline hypotension on Precedex (now resolved, Precedex discontinued)  Acute hospital delirium in the setting of narcotics - Mental status continues to wax and wane -most notably around narcotics - Hold IV narcotics unless absolutely necessary - Family indicates patient poorly tolerates morphine  - mild improvement with IV dilaudid   Hypotension - Likely multifactorial in setting of poor p.o. intake, narcotics and ongoing home regimen - Carvedilol  discontinued, IVF overnight after bolus - BP improved today after fluids  Acute heart failure exacerbation unlikely  - Repeat echo unchanged from 2023, EF 60-65% with normal LV function, no regional wall motion abnormalities with grade 2 diastolic dysfunction.  - Resume PO furosemide  - patient without overt volume overload  Compression atelectasis  Pneumonia unlikely - Empiric treatment with IV antibiotics completed - no fevers, cough, sputum production with only minimally elevated WBC(now resolved) initially in the setting of acute stress/pain.  Hyperlipidemia: Statin   Essential hypertension: Continue carvedilol ; hold other home medications - BP well controlled while on narcotics may avoid home regimen to avoid hypotension  History of lung cancer: In remission, history of chemo and radiation  Renal cell carcinoma: Status post nephrectomy  Chronic kidney disease stage IIIa: Appears to be near baseline  Hyponatremia: In the setting of hypovolemia, improving Osteoporosis: Recommend  further bone studies including bone density studies/repeat  imaging in the outpatient setting as appropriate.  Hypercalcemia: Not clinically relevant -currently back to baseline/within normal limits  DVT prophylaxis: enoxaparin  (LOVENOX ) injection 40 mg Start: 09/02/24 1200 SCDs Start: 08/26/24 2253 Code Status:   Code Status: Full Code Family Communication: Daughter at bedside  Status is: Inpatient  Dispo: The patient is from: Home              Anticipated d/c is to: Home              Anticipated d/c date is: 72+ hours              Patient currently not medically stable for discharge  Consultants:  Interventional radiology  Procedures:  Tentatively scheduled kyphoplasty 08/31/2024  Antimicrobials:  Azithromycin , ceftriaxone  completed  Subjective: No acute issues or events overnight, patient having episodes of agitation and borderline hypotension while on Precedex, Zyprexa given overnight with moderate improvement in symptoms.  Objective: Vitals:   09/01/24 0309 09/01/24 0400 09/01/24 0500 09/01/24 0600  BP:  (!) 173/59 (!) 156/74 (!) 162/82  Pulse:  75 65 68  Resp:  17 17 18   Temp: 98.4 F (36.9 C) 98.4 F (36.9 C)  97.8 F (36.6 C)  TempSrc: Oral Oral  Oral  SpO2:  98% 98% 99%  Weight:      Height:        Intake/Output Summary (Last 24 hours) at 09/01/2024 0728 Last data filed at 09/01/2024 0600 Gross per 24 hour  Intake 494.99 ml  Output 2650 ml  Net -2155.01 ml   Filed Weights   08/26/24 1814 08/26/24 2340 08/29/24 0806  Weight: 73 kg 74.5 kg 78.1 kg    Examination:  General: No acute distress, off BiPAP this morning on nasal cannula alert and oriented to person place and situation HEENT:  Normocephalic atraumatic.  Sclerae nonicteric, noninjected.  Extraocular movements intact bilaterally. Neck:  Without mass or deformity.  Trachea is midline. Lungs: Diminished bilaterally with poor chest wall expansion without overt rales wheeze or rhonchi. Heart:   Regular rate and rhythm.  Without murmurs, rubs, or gallops. Abdomen:  Soft, nontender, nondistended.  Without guarding or rebound. Extremities: Without cyanosis, clubbing, edema, or obvious deformity  Data Reviewed: I have personally reviewed following labs and imaging studies  CBC: Recent Labs  Lab 08/26/24 1906 08/27/24 0005 08/27/24 9366 08/28/24 0412 08/30/24 0250 08/31/24 0239  WBC 11.6* 13.7* 14.2* 11.7* 8.9 8.1  NEUTROABS 9.8*  --   --  10.6*  --   --   HGB 12.7 12.7 12.3 11.6* 11.5* 12.6  HCT 39.1 39.5 39.9 38.1 35.3* 39.9  MCV 97.0 98.8 100.3* 100.5* 97.8 95.7  PLT 236 240 231 237 252 295   Basic Metabolic Panel: Recent Labs  Lab 08/29/24 0031 08/29/24 0720 08/29/24 1215 08/30/24 0250 08/31/24 0239  NA 129* 131* 130* 134* 140  K 5.4* 5.3* 5.4* 5.2* 5.2*  CL 87* 89* 89* 91* 98  CO2 36* 34* 29 32 34*  GLUCOSE 127* 127* 102* 105* 139*  BUN 56* 58* 63* 70* 61*  CREATININE 1.90* 1.83* 1.79* 1.84* 1.18*  CALCIUM  9.2 8.9 8.8* 9.2 10.0   GFR: Estimated Creatinine Clearance: 37.6 mL/min (A) (by C-G formula based on SCr of 1.18 mg/dL (H)).  Liver Function Tests: Recent Labs  Lab 08/26/24 1906 08/27/24 0633  AST 25 20  ALT 16 15  ALKPHOS 81 76  BILITOT 0.6 0.4  PROT 6.7 6.5  ALBUMIN  4.2 3.9   BNP (last 3 results) Recent  Labs    08/26/24 1906  PROBNP 1,699.0*   CBG: Recent Labs  Lab 08/26/24 2038 08/29/24 0756  GLUCAP 145* 140*    Recent Results (from the past 240 hours)  Blood culture (routine x 2)     Status: None   Collection Time: 08/26/24 10:10 PM   Specimen: BLOOD RIGHT ARM  Result Value Ref Range Status   Specimen Description   Final    BLOOD RIGHT ARM Performed at [facility], [address] [place], [place] [address]    Special Requests   Final    Blood Culture results may not be optimal due to an inadequate volume of blood received in culture bottles BOTTLES DRAWN AEROBIC AND ANAEROBIC Performed at [facility], [address] [place], [place] [address]    Culture   Final    NO GROWTH 5 DAYS Performed at [facility], [address] [place], [place] [address]    Report Status 09/01/2024 FINAL  Final  Blood culture (routine x 2)     Status: None (Preliminary result)   Collection Time: 08/26/24 10:15 PM   Specimen: BLOOD  Result Value Ref Range Status   Specimen Description   Final    BLOOD RIGHT ANTECUBITAL Performed at [facility], [address], [place], [place] [address]    Special Requests   Final    Blood Culture results may not be optimal due to an inadequate volume of blood received in culture bottles BOTTLES DRAWN AEROBIC AND ANAEROBIC Performed at [facility], [address], [place], [place] [address]    Culture  Setup Time   Final    GRAM POSITIVE RODS ANAEROBIC BOTTLE ONLY CRITICAL RESULT CALLED TO, READ BACK BY AND VERIFIED WITH: PHARMD M LILLISTON 09/01/2024 @ 0136 BY AB Performed at [facility], [address], [place], [place] [address]    Culture GRAM POSITIVE RODS  Final   Report Status PENDING  Incomplete  MRSA Next Gen by PCR, Nasal     Status: None   Collection Time: 08/29/24  8:20 AM   Specimen: Nasal Mucosa; Nasal Swab  Result Value Ref Range Status   MRSA by PCR Next Gen NOT DETECTED NOT DETECTED Final    Comment: (NOTE) The GeneXpert MRSA Assay (FDA approved for NASAL specimens only), is one component of a comprehensive MRSA colonization surveillance program. It is not intended to diagnose MRSA infection nor to guide or monitor treatment for MRSA infections. Test performance is not FDA approved in patients less than 87 years old. Performed at [facility], [address], [place], [place] [address]       Radiology Studies: No results found.  Scheduled Meds:  arformoterol  15 mcg Nebulization BID   Chlorhexidine  Gluconate Cloth  6 each Topical Daily   [START ON  09/02/2024] enoxaparin  (LOVENOX ) injection  40 mg Subcutaneous Q24H   feeding supplement  1 Container Oral BID BM   furosemide   20 mg Oral BID   lidocaine   1 patch Transdermal Q24H   methylPREDNISolone  (SOLU-MEDROL ) injection  40 mg Intravenous Daily   polyethylene glycol  17 g Oral BID   revefenacin  175 mcg Nebulization Daily   senna-docusate  1 tablet Oral BID   Continuous Infusions:  dexmedetomidine (PRECEDEX) IV infusion 0.4 mcg/kg/hr (09/01/24 0541)     LOS: 6 days   Time spent:  Tammy JAYSON Montclair, DO [facility]  If 7PM-7AM,  please contact night-coverage [email or web address]  09/01/2024, 7:28 AM

## 2024-09-01 NOTE — Plan of Care (Signed)
  Problem: Nutrition: Goal: Adequate nutrition will be maintained Outcome: Progressing   Problem: Elimination: Goal: Will not experience complications related to urinary retention Outcome: Progressing   Problem: Activity: Goal: Risk for activity intolerance will decrease Outcome: Progressing   Problem: Clinical Measurements: Goal: Respiratory complications will improve Outcome: Progressing

## 2024-09-01 NOTE — Plan of Care (Signed)
   Problem: Clinical Measurements: Goal: Respiratory complications will improve Outcome: Progressing   Problem: Activity: Goal: Risk for activity intolerance will decrease Outcome: Progressing

## 2024-09-02 DIAGNOSIS — J9621 Acute and chronic respiratory failure with hypoxia: Secondary | ICD-10-CM | POA: Diagnosis not present

## 2024-09-02 DIAGNOSIS — I10 Essential (primary) hypertension: Secondary | ICD-10-CM | POA: Diagnosis not present

## 2024-09-02 DIAGNOSIS — F329 Major depressive disorder, single episode, unspecified: Secondary | ICD-10-CM | POA: Diagnosis not present

## 2024-09-02 DIAGNOSIS — S22000A Wedge compression fracture of unspecified thoracic vertebra, initial encounter for closed fracture: Secondary | ICD-10-CM | POA: Diagnosis not present

## 2024-09-02 LAB — CBC
HCT: 41.9 % (ref 36.0–46.0)
Hemoglobin: 13 g/dL (ref 12.0–15.0)
MCH: 30.4 pg (ref 26.0–34.0)
MCHC: 31 g/dL (ref 30.0–36.0)
MCV: 98.1 fL (ref 80.0–100.0)
Platelets: 327 K/uL (ref 150–400)
RBC: 4.27 MIL/uL (ref 3.87–5.11)
RDW: 15.9 % — ABNORMAL HIGH (ref 11.5–15.5)
WBC: 13.9 K/uL — ABNORMAL HIGH (ref 4.0–10.5)
nRBC: 0 % (ref 0.0–0.2)

## 2024-09-02 LAB — BASIC METABOLIC PANEL WITH GFR
Anion gap: 7 (ref 5–15)
BUN: 40 mg/dL — ABNORMAL HIGH (ref 8–23)
CO2: 42 mmol/L — ABNORMAL HIGH (ref 22–32)
Calcium: 10.2 mg/dL (ref 8.9–10.3)
Chloride: 93 mmol/L — ABNORMAL LOW (ref 98–111)
Creatinine, Ser: 0.95 mg/dL (ref 0.44–1.00)
GFR, Estimated: >60 mL/min (ref >60)
Glucose, Bld: 97 mg/dL (ref 70–99)
Potassium: 4.7 mmol/L (ref 3.5–5.1)
Sodium: 141 mmol/L (ref 135–145)

## 2024-09-02 MED ORDER — TRAMADOL HCL 50 MG PO TABS
50.0000 mg | ORAL_TABLET | Freq: Four times a day (QID) | ORAL | Status: DC | PRN
  Administered 2024-09-03: 50 mg via ORAL
  Filled 2024-09-02: qty 1

## 2024-09-02 MED ORDER — PANTOPRAZOLE SODIUM 40 MG IV SOLR
40.0000 mg | Freq: Two times a day (BID) | INTRAVENOUS | Status: DC
  Administered 2024-09-02 – 2024-09-03 (×3): 40 mg via INTRAVENOUS
  Filled 2024-09-02 (×3): qty 10

## 2024-09-02 MED ORDER — PREDNISONE 20 MG PO TABS: 20.0000 mg | ORAL_TABLET | Freq: Every day | ORAL | Status: DC

## 2024-09-02 MED ORDER — FLEET ENEMA RE ENEM: 1.0000 | ENEMA | Freq: Once | RECTAL | Status: DC | PRN

## 2024-09-02 MED ORDER — ALUM & MAG HYDROXIDE-SIMETH 200-200-20 MG/5ML PO SUSP
30.0000 mL | ORAL | Status: DC | PRN
  Administered 2024-09-02 (×2): 30 mL via ORAL
  Filled 2024-09-02 (×3): qty 30

## 2024-09-02 MED ORDER — PREDNISONE 20 MG PO TABS
40.0000 mg | ORAL_TABLET | Freq: Every day | ORAL | Status: DC
  Administered 2024-09-03: 40 mg via ORAL
  Filled 2024-09-02: qty 2

## 2024-09-02 MED ORDER — BISMUTH SUBSALICYLATE 262 MG/15ML PO SUSP
30.0000 mL | Freq: Three times a day (TID) | ORAL | Status: DC
  Administered 2024-09-02 – 2024-09-03 (×6): 30 mL via ORAL
  Filled 2024-09-02: qty 236

## 2024-09-02 MED ORDER — DICYCLOMINE HCL 10 MG PO CAPS
10.0000 mg | ORAL_CAPSULE | Freq: Once | ORAL | Status: AC
  Administered 2024-09-02: 10 mg via ORAL
  Filled 2024-09-02: qty 1

## 2024-09-02 MED ORDER — HYDROCODONE-ACETAMINOPHEN 5-325 MG PO TABS
1.0000 | ORAL_TABLET | Freq: Four times a day (QID) | ORAL | Status: DC | PRN
Start: 2024-09-02 — End: 2024-09-03

## 2024-09-02 NOTE — TOC Progression Note (Signed)
 Transition of Care [facility]) - Progression Note    Patient Details  Name: Tammy Ball MRN: [id number] Date of Birth: June 30, 1941  [facility] CM/SW Contact  Lj Miyamoto, Nathanel, RN Phone Number: 09/02/2024, 10:59 AM  Clinical Narrative: Per [facility] for pasrr need residential street address not PO Box listed on demographic sheet. Patient states address:[address] [place] [address]-will send to [facility] MUST.      Expected Discharge Plan: Skilled Nursing Facility Barriers to Discharge: Continued Medical Work up, Other (must enter comment) (Office manager)               Expected Discharge Plan and Services In-house Referral: NA Discharge Planning Services: CM Consult Post Acute Care Choice: Skilled Nursing Facility Living arrangements for the past 2 months: Single Family Home                 DME Arranged: N/A DME Agency: NA       HH Arranged: NA HH Agency: NA         Social Drivers of Health (SDOH) Interventions SDOH Screenings   Food Insecurity: No Food Insecurity (08/27/2024)  Housing: Low Risk  (08/27/2024)  Transportation Needs: No Transportation Needs (08/27/2024)  Recent Concern: Transportation Needs - Unmet Transportation Needs (06/02/2024)  Utilities: Not At Risk (08/27/2024)  Alcohol  Screen: Low Risk  (06/02/2024)  Depression (PHQ2-9): Medium Risk (08/25/2024)  Financial Resource Strain: Medium Risk (06/02/2024)  Physical Activity: Inactive (06/02/2024)  Social Connections: Socially Isolated (08/27/2024)  Stress: No Stress Concern Present (06/02/2024)  Tobacco Use: Medium Risk (08/26/2024)  Health Literacy: Adequate Health Literacy (06/02/2024)    Readmission Risk Interventions    08/31/2024    3:47 PM 06/11/2022   12:06 PM  Readmission Risk Prevention Plan  Transportation Screening Complete Complete  PCP or Specialist Appt within 3-5 Days Complete Complete  HRI or Home Care Consult Complete Complete  Social Work Consult for Recovery Care  Planning/Counseling Complete Complete  Palliative Care Screening Not Applicable Not Applicable  Medication Review Oceanographer) Complete Complete

## 2024-09-02 NOTE — Progress Notes (Signed)
 Referring Physician(s): Lue ORN  Supervising Physician: Hughes Simmonds  Patient Status:  [facility] - In-pt  Chief Complaint: Back pain, T 7 fracture; s/p T 7 kyphoplasty 09/01/24   Subjective: Pt states that her back pain has improved some since KP yesterday; however now she has some epigastric/substernal discomfort,? Reflux vs referred pain from known comp fx; no new neuro chnages   Allergies: Bee venom, Amlodipine , Levofloxacin, Alendronate , and Hctz [hydrochlorothiazide ]  Medications: Prior to Admission medications   Medication Sig Start Date End Date Taking? Authorizing Provider  acetaminophen  (TYLENOL ) 500 MG tablet Take 500 mg by mouth every 6 (six) hours as needed for headache or moderate pain (pain).   Yes [provider]  albuterol  (PROVENTIL ) (2.5 MG/3ML) 0.083% nebulizer solution INHALE 3 ML BY NEBULIZATION EVERY 6 HOURS AS NEEDED FOR WHEEZING OR SHORTNESS OF BREATH 04/30/24  Yes Byrum, Lamar RAMAN, MD  albuterol  (VENTOLIN  HFA) 108 (90 Base) MCG/ACT inhaler Inhale 1-2 puffs into the lungs every 6 (six) hours as needed for wheezing or shortness of breath. 04/29/24  Yes [name], MD  Ascorbic Acid  (VITAMIN C) 1000 MG tablet Take 1,000 mg by mouth every morning.   Yes [provider]  aspirin  EC 81 MG tablet Take 81 mg by mouth every morning.   Yes [provider]  atorvastatin  (LIPITOR) 40 MG tablet TAKE 1 TABLET BY MOUTH EVERY DAY 02/02/24  Yes [name], Miguel Jose, MD  budesonide -glycopyrrolate -formoterol  (BREZTRI  AEROSPHERE) 160-9-4.8 MCG/ACT AERO inhaler Inhale 2 puffs into the lungs in the morning and at bedtime. 04/29/24  Yes [name], MD  Calcium  Carbonate Antacid (TUMS PO) Take 1 tablet by mouth 4 (four) times daily as needed (heartburn).   Yes [provider]  Carboxymethylcellulose Sodium (ARTIFICIAL TEARS OP) Place 1 drop into both eyes daily as needed (dry eyes).   Yes [provider]  carvedilol  (COREG ) 25 MG  tablet Take 1 tablet (25 mg total) by mouth 2 (two) times daily. 04/15/24  Yes [name], [name], NP  cholecalciferol  (VITAMIN D3) 25 MCG (1000 UNIT) tablet Take 1,000 Units by mouth daily.   Yes [provider]  cycloSPORINE (RESTASIS) 0.05 % ophthalmic emulsion Place 1 drop into both eyes See admin instructions. Every 2 hours 08/02/24  Yes [provider]  Guaifenesin  (MUCINEX  MAXIMUM STRENGTH) 1200 MG TB12 Take 1,200 mg by mouth every morning.   Yes [provider]  HYDROcodone -acetaminophen  (NORCO/VICODIN) 5-325 MG tablet Take 1 tablet by mouth every 6 (six) hours as needed. 08/25/24  Yes [name], [name], MD  loperamide  (IMODIUM ) 2 MG capsule Take 1 capsule (2 mg total) by mouth 4 (four) times daily as needed for diarrhea or loose stools. 08/19/23  Yes [name], [name], MD  losartan  (COZAAR ) 100 MG tablet TAKE 1 TABLET BY MOUTH EVERY DAY 02/17/24  Yes [name], [name], MD  Omega-3 Fatty Acids  (FISH OIL) 1200 MG CAPS Take 1,200 mg by mouth every morning.   Yes [provider]  ondansetron  (ZOFRAN -ODT) 4 MG disintegrating tablet 4mg  ODT q4 hours prn nausea/vomit 08/22/24  Yes Zammit, Joseph, MD  OXYGEN  Inhale 2 L into the lungs as needed (to keep SATS at or above 90).   Yes [provider]  sodium chloride  (OCEAN) 0.65 % SOLN nasal spray Place 1 spray into both nostrils at bedtime as needed for congestion.   Yes [provider]  traMADol  (ULTRAM ) 50 MG tablet Take 1 tablet (50 mg total) by mouth every 6 (six) hours as needed. 08/22/24  Yes Zammit, Joseph, MD  traZODone  (DESYREL ) 50 MG tablet Take 0.5-1 tablets (25-50 mg total) by mouth at bedtime as needed for sleep. 06/16/24  Yes Purcell [name], MD     Vital Signs: BP 121/71 (BP Location: Left Arm)   Pulse 80   Temp 98.2 F (36.8 C) (Oral)   Resp 20   Ht 5' 3 (1.6 m)   Wt 172 lb 3.2 oz (78.1 kg)   SpO2 94%   BMI 30.50 kg/m   Physical Exam: awake, answers simple questions  ok; puncture sites T 7 region clean and dry, mildly tender, no hematomas; pt moving all fours ok  Imaging: IR KYPHO THORACIC WITH BONE BIOPSY Result Date: 09/01/2024 CLINICAL DATA:  Acute T7 compression fracture Briefly, 83 year old female with acute, symptomatic thoracic vertebral body compression fractures, greatest at T7. Additional fractures on MRI at T5 and T6, to a mild degree. Failed conservative management EXAM: T7 VERTEBRAL BODY AUGMENTATION WITH BALLOON KYPHOPLASTY COMPARISON:  CT T-spine, 08/22/2024.  MR T-spine, 08/27/2024. MEDICATIONS: As antibiotic prophylaxis, Ancef  2 gm IV was ordered pre-procedure and administered intravenously within 1 hour of incision. ANESTHESIA/SEDATION: Moderate (conscious) sedation was employed during this procedure. A total of Versed  1.5 mg and Fentanyl  100 mcg was administered intravenously. Moderate Sedation Time: 34 minutes. The patient's level of consciousness and vital signs were monitored continuously by radiology nursing throughout the procedure under my direct supervision. FLUOROSCOPY: Radiation Exposure Index and estimated peak skin dose (PSD); Reference air kerma (RAK), 187 mGy. COMPLICATIONS: None immediate. PROCEDURE: The procedure, risks (including but not limited to bleeding, infection, organ damage), benefits, and alternatives were explained to the patient and/or patient's representative. Questions regarding the procedure were encouraged and answered. The patient understands and consents to the procedure. The patient was placed prone on the fluoroscopic table. The skin overlying the lower lumbar spine region was then prepped and draped in the usual sterile fashion. Maximal barrier sterile technique was utilized including caps, mask, sterile gowns, sterile gloves, sterile drape, hand hygiene and skin antiseptic. Intravenous Fentanyl  and Versed  were administered as conscious sedation during continuous cardiorespiratory monitoring by the radiology RN. The  LEFT pedicle at T7 was then infiltrated with 0.5% Marcaine  followed by the advancement of a Kyphon trocar needle through the pedicle into the posterior one-third of the vertebral body. The osteo drill was advanced to the anterior third of the vertebral body. The osteo drill was retracted. Through the working cannula, a Kyphon inflatable bone tamp 15 x 3 was advanced and positioned with the distal marker approximately 5 mm from the anterior aspect of the cortex. Appropriate positioning was confirmed on the AP projection. At this time, the balloon was expanded using contrast via a Kyphon inflation syringe device via micro tubing. In similar fashion, the RIGHT T7 pedicle was infiltrated with 0.5% Marcaine  followed by the advancement of a second Kyphon trocar needle through the right pedicle into the posterior third of the vertebral body. Subsequently, the osteo drill was coaxially advanced to the anterior right third. The osteo drill was exchanged for a Kyphon inflatable bone tamp 15 x 3, advanced to the 5 mm of the anterior aspect of the cortex. The balloon was then expanded using contrast as above. Inflations were continued until there was near apposition with the superior end plate. At this time, methylmethacrylate mixture was reconstituted in the Kyphon bone mixing device system. This was then loaded into the delivery mechanism, attached to Kyphon bone fillers. The balloons were deflated and removed followed by  the instillation of methylmethacrylate mixture with excellent filling in the AP and lateral projections. No extravasation was noted in the disk spaces or posteriorly into the spinal canal. No epidural venous contamination was seen. The working cannulae and the bone filler were then retrieved and removed. Hemostasis was achieved with manual compression. The patient tolerated the procedure well without immediate postprocedural complication. FINDINGS: *Adequate cement filling of the T7 vertebral body on both  the AP and lateral projections. *No extravasation was noted in the disk spaces or posteriorly into the spinal canal, nor epidural venous contamination was seen. *The patient has suffered a fractures of T5, T6 and T7. It is recommended that patients aged 23 years or older be evaluated for possible testing or treatment of osteoporosis. A copy of this procedure report is made available to the patient's referring physician IMPRESSION: Successful T7 vertebral body cement augmentation with balloon kyphoplasty. Per CMS PQRS reporting requirements (PQRS Measure 24): Given the patient's age of greater than 50 and the fracture site (hip, distal radius, or spine), the patient should be tested for osteoporosis using DXA, and the appropriate treatment considered based on the DXA result. RECOMMENDATIONS: Should the patient fail to achieve adequate pain relief from her thoracic vertebral body compression deformities after this augmentation, kyphoplasty at T5 and T6 levels could be pursued. Thom Hall, MD [facility] Radiology Electronically Signed   By: Thom Hall M.D.   On: 09/01/2024 17:07    Labs:  CBC: Recent Labs    08/30/24 0250 08/31/24 0239 09/01/24 0728 09/02/24 0310  WBC 8.9 8.1 11.7* 13.9*  HGB 11.5* 12.6 13.1 13.0  HCT 35.3* 39.9 40.2 41.9  PLT 252 295 263 327    COAGS: Recent Labs    08/28/24 0412  INR 0.9    BMP: Recent Labs    08/30/24 0250 08/31/24 0239 09/01/24 0728 09/02/24 0310  NA 134* 140 139 141  K 5.2* 5.2* 5.2* 4.7  CL 91* 98 97* 93*  CO2 32 34* 36* 42*  GLUCOSE 105* 139* 98 97  BUN 70* 61* 47* 40*  CALCIUM  9.2 10.0 9.7 10.2  CREATININE 1.84* 1.18* 0.89 0.95  GFRNONAA 27* 46* >60 >60    LIVER FUNCTION TESTS: Recent Labs    03/16/24 0916 08/22/24 1559 08/26/24 1906 08/27/24 0633  BILITOT 0.6 0.9 0.6 0.4  AST 19 21 25 20   ALT 15 13 16 15   ALKPHOS 71 84 81 76  PROT 6.9 6.7 6.7 6.5  ALBUMIN  4.4 4.2 4.2 3.9     Assessment and Plan: Patient with history of acute on chronic hypercapnic and hypoxemic respiratory failure, COPD with acute exacerbation, history of lung cancer with prior right upper lobectomy, chronic diastolic heart failure, back pain secondary to acute T7 compression fracture; s/p image guided T7 kyphoplasty 10/21; afebrile, WBC 13.9(11.7), hgb stable; creat nl; keep HOB elevated; continue PT; try reflux meds; as per Dr. Thelda recs should the patient fail to achieve adequate pain relief from her thoracic vertebral body compression deformities after this augmentation, kyphoplasty at T5 and T6 levels could be pursued.   Electronically Signed: D. Franky Rakers, PA-C 09/02/2024, 9:36 AM   I spent a total of 15 Minutes at the the patient's bedside AND on the patient's hospital floor or unit, greater than 50% of which was counseling/coordinating care for thoracic 7 kyphoplasty    Patient ID: Tammy Ball, female   DOB: [birth year], 83 y.o.   MRN: [id number]

## 2024-09-02 NOTE — Plan of Care (Signed)

## 2024-09-02 NOTE — TOC Progression Note (Addendum)
 Transition of Care [facility]) - Progression Note    Patient Details  Name: Tammy Ball MRN: [id number] Date of Birth: 03/26/1941  Transition of Care [facility]) CM/SW Contact  Arelyn Gauer, Nathanel, RN Phone Number: 09/02/2024, 11:24 AM  Clinical Narrative:  Await pasrr, & bed choice.          Service Provider Request Status Stars Address Phone  [facility] Preferred SNF  Accepted 1 [address], [place] [address] [phone]  [facility] Preferred SNF  Accepted 5 [address], [place] [address] [phone]  [facility] REHAB[facility]IT[facility], [facility] Preferred SNF  Accepted 4 [address], [place] [address] [phone]  [facility]  Accepted 4 [address] [place] [address] [phone]  [facility] Preferred SNF  Accepted 3 [address] [place] [address] [phone]   -12:57p [place] chosen await med stability, Programmer, multimedia.  Expected Discharge Plan: Skilled Nursing Facility Barriers to Discharge: Awaiting State Approval THEONE)               Expected Discharge Plan and Services In-house Referral: NA Discharge Planning Services: CM Consult Post Acute Care Choice: Skilled Nursing Facility Living arrangements for the past 2 months: Single Family Home                 DME Arranged: N/A DME Agency: NA       HH Arranged: NA HH Agency: NA         Social Drivers of Health (SDOH) Interventions SDOH Screenings   Food Insecurity: No Food Insecurity (08/27/2024)  Housing: Low Risk  (08/27/2024)  Transportation Needs: No Transportation Needs (08/27/2024)  Recent Concern: Transportation Needs - Unmet Transportation Needs (06/02/2024)  Utilities: Not At Risk (08/27/2024)  Alcohol  Screen: Low Risk  (06/02/2024)  Depression (PHQ2-9): Medium Risk (08/25/2024)  Financial Resource Strain: Medium Risk (06/02/2024)  Physical Activity: Inactive (06/02/2024)   Social Connections: Socially Isolated (08/27/2024)  Stress: No Stress Concern Present (06/02/2024)  Tobacco Use: Medium Risk (08/26/2024)  Health Literacy: Adequate Health Literacy (06/02/2024)    Readmission Risk Interventions    08/31/2024    3:47 PM 06/11/2022   12:06 PM  Readmission Risk Prevention Plan  Transportation Screening Complete Complete  PCP or Specialist Appt within 3-5 Days Complete Complete  HRI or Home Care Consult Complete Complete  Social Work Consult for Recovery Care Planning/Counseling Complete Complete  Palliative Care Screening Not Applicable Not Applicable  Medication Review Oceanographer) Complete Complete

## 2024-09-02 NOTE — Progress Notes (Signed)
 PROGRESS NOTE    Tammy Ball  FMW:[id number] DOB: 29-Dec-1940 DOA: 08/26/2024 PCP: Purcell [name], MD   Brief Narrative:  Tammy Ball is a 83 y.o. female with medical history significant of thoracic spinal fracture involving T7 that was recently diagnosed, history of lung cancer status post lobectomy and chemotherapy, history of renal cell carcinoma, chemotherapy induced neuropathy, diastolic dysfunction CHF, COPD with chronic respiratory failure on 2 L of oxygen  at home, depression, essential hypertension, hyperlipidemia, coronary artery disease, radiation induced neuropathy, history of left nephrectomy who has been following up with PCP as well as emergency room after significant back pain that started about 2 weeks ago. Patiently as above with acute onset back pain, imaging consistent with wedge fracture, hospitalist called for admission.  Assessment & Plan: Principal Problem:   Acute on chronic respiratory failure (HCC) Active Problems:   Essential hypertension   COPD GOLD B-C   Renal cell cancer (HCC)   Depression   Hyperlipidemia   Non-small cell carcinoma of right lung, stage 2 (HCC)   S/P lobectomy of lung   Age-related osteoporosis with current pathological fracture   Stage 3a chronic kidney disease (HCC)   Chronic diastolic CHF (congestive heart failure) (HCC)   Hyponatremia   Closed compression fracture of thoracic vertebra (HCC)  Acute on chronic hypoxic hypercarbic respiratory failure, multifactorial, POA: Resolving - Given acuity of hypoxia suspect this is most notably affected by her recent compression fracture - Continue to titrate oxygen  as appropriate (on 2 L nasal cannula at home at baseline) - Patient currently satting well on her home 2 L nasal cannula - Likely compression atelectasis given fracture vs pneumonia given lack of symptoms otherwise - Concurrent history of heart failure and COPD less likely playing a role given above -no improvement with  diuretics  Hyperkalemia - Lokelma previously given, currently within normal limits Acute T7 compression fracture, POA With respiratory compromise - Reports no trauma or falls, unclear etiology - Interventional radiology following along, status post T7 vertebral body augmentation with balloon kyphoplasty 09/01/2024, tolerated well, no complications.  -Patient adamant she does not want any further strong medications will discontinue IV narcotics and oxycodone  and transition patient back to home tramadol  and hydrocodone   Acute hospital delirium in the setting of narcotics improving - Mental status continues to wax and wane -most notably around narcotics - Hold IV narcotics unless absolutely necessary - Family indicates patient poorly tolerates morphine  - mild improvement with IV dilaudid   Abdominal pain/GERD  - Continue PPI, supportive care, likely secondary to recent steroid use and prolonged n.p.o. status  Hypotension - Likely multifactorial in setting of poor p.o. intake, narcotics and home blood pressure regimen - Carvedilol  discontinued, IVF previously required to maintain MAP, now improving off Precedex  Acute heart failure exacerbation unlikely  - Repeat echo unchanged from 2023, EF 60-65% with normal LV function, no regional wall motion abnormalities with grade 2 diastolic dysfunction.  - Resume PO furosemide  - patient without overt volume overload  Compression atelectasis  Pneumonia unlikely - Empiric treatment with IV antibiotics completed - no fevers, cough, sputum production with only minimally elevated WBC(now resolved) initially in the setting of acute stress/pain. - Transition to p.o. steroid taper as above  Hyperlipidemia: Statin   Essential hypertension: Continue carvedilol ; hold other home medications - BP well controlled while on narcotics may avoid home regimen to avoid hypotension  History of lung cancer: In remission, history of chemo and radiation  Renal cell  carcinoma: Status post nephrectomy  Chronic kidney disease stage IIIa: Appears to be near baseline  Hyponatremia: In the setting of hypovolemia, improving Osteoporosis: Recommend further bone studies including bone density studies/repeat imaging in the outpatient setting as appropriate.  Hypercalcemia: Not clinically relevant -currently back to baseline/within normal limits  DVT prophylaxis: enoxaparin  (LOVENOX ) injection 40 mg Start: 09/02/24 1200 SCDs Start: 08/26/24 2253 Code Status:   Code Status: Full Code Family Communication: Daughter at bedside  Status is: Inpatient  Dispo: The patient is from: Home              Anticipated d/c is to: Home              Anticipated d/c date is: 72+ hours              Patient currently not medically stable for discharge  Consultants:  Interventional radiology  Procedures:  Tentatively scheduled kyphoplasty 08/31/2024  Antimicrobials:  Azithromycin , ceftriaxone  completed  Subjective: No acute issues or events overnight, currently reporting midepigastric pain consistent with GERD. Patient tolerated kyphoplasty well yesterday, reports she would like to avoid heavy narcotics today, as such we will discontinue IV medications and transition patient to her home tramadol  and hydrocodone .  She otherwise denies nausea vomiting diarrhea constipation headache fevers chills  Objective: Vitals:   09/02/24 0000 09/02/24 0100 09/02/24 0200 09/02/24 0339  BP: (!) 165/55 (!) 159/82 135/63 (!) 141/76  Pulse: 87 85 78 89  Resp: 19 16 13 18   Temp: 98.9 F (37.2 C)   99 F (37.2 C)  TempSrc: Oral     SpO2: 95% 94% 95% 91%  Weight:      Height:        Intake/Output Summary (Last 24 hours) at 09/02/2024 0739 Last data filed at 09/02/2024 0300 Gross per 24 hour  Intake 369.23 ml  Output 2700 ml  Net -2330.77 ml   Filed Weights   08/26/24 1814 08/26/24 2340 08/29/24 0806  Weight: 73 kg 74.5 kg 78.1 kg    Examination:  General: No acute  distress, off BiPAP this morning on nasal cannula alert and oriented to person place and situation HEENT:  Normocephalic atraumatic.  Sclerae nonicteric, noninjected.  Extraocular movements intact bilaterally. Neck:  Without mass or deformity.  Trachea is midline. Lungs: Diminished bilaterally with poor chest wall expansion without overt rales wheeze or rhonchi. Heart:  Regular rate and rhythm.  Without murmurs, rubs, or gallops. Abdomen:  Soft, nontender, nondistended.  Without guarding or rebound. Extremities: Without cyanosis, clubbing, edema, or obvious deformity  Data Reviewed: I have personally reviewed following labs and imaging studies  CBC: Recent Labs  Lab 08/26/24 1906 08/27/24 0005 08/28/24 0412 08/30/24 0250 08/31/24 0239 09/01/24 0728 09/02/24 0310  WBC 11.6*   < > 11.7* 8.9 8.1 11.7* 13.9*  NEUTROABS 9.8*  --  10.6*  --   --   --   --   HGB 12.7   < > 11.6* 11.5* 12.6 13.1 13.0  HCT 39.1   < > 38.1 35.3* 39.9 40.2 41.9  MCV 97.0   < > 100.5* 97.8 95.7 95.7 98.1  PLT 236   < > 237 252 295 263 327   < > = values in this interval not displayed.   Basic Metabolic Panel: Recent Labs  Lab 08/29/24 1215 08/30/24 0250 08/31/24 0239 09/01/24 0728 09/02/24 0310  NA 130* 134* 140 139 141  K 5.4* 5.2* 5.2* 5.2* 4.7  CL 89* 91* 98 97* 93*  CO2 29 32 34* 36* 42*  GLUCOSE 102* 105* 139* 98 97  BUN 63* 70* 61* 47* 40*  CREATININE 1.79* 1.84* 1.18* 0.89 0.95  CALCIUM  8.8* 9.2 10.0 9.7 10.2   GFR: Estimated Creatinine Clearance: 46.8 mL/min (by C-G formula based on SCr of 0.95 mg/dL).  Liver Function Tests: Recent Labs  Lab 08/26/24 1906 08/27/24 0633  AST 25 20  ALT 16 15  ALKPHOS 81 76  BILITOT 0.6 0.4  PROT 6.7 6.5  ALBUMIN  4.2 3.9   BNP (last 3 results) Recent Labs    08/26/24 1906  PROBNP 1,699.0*   CBG: Recent Labs  Lab 08/26/24 2038 08/29/24 0756  GLUCAP 145* 140*    Recent Results (from the past 240 hours)  Blood culture (routine x 2)      Status: None   Collection Time: 08/26/24 10:10 PM   Specimen: BLOOD RIGHT ARM  Result Value Ref Range Status   Specimen Description   Final    BLOOD RIGHT ARM Performed at [facility], [address] [place], [place] [address]    Special Requests   Final    Blood Culture results may not be optimal due to an inadequate volume of blood received in culture bottles BOTTLES DRAWN AEROBIC AND ANAEROBIC Performed at [facility], [address], [place], [place] [address]    Culture   Final    NO GROWTH 5 DAYS Performed at [facility], [address], [place], [place] [address]    Report Status 09/01/2024 FINAL  Final  Blood culture (routine x 2)     Status: None (Preliminary result)   Collection Time: 08/26/24 10:15 PM   Specimen: BLOOD  Result Value Ref Range Status   Specimen Description   Final    BLOOD RIGHT ANTECUBITAL Performed at [facility], [address] [place], [place] [address]    Special Requests   Final    Blood Culture results may not be optimal due to an inadequate volume of blood received in culture bottles BOTTLES DRAWN AEROBIC AND ANAEROBIC Performed at [facility], [address], [place], [place] [address]    Culture  Setup Time   Final    GRAM POSITIVE RODS ANAEROBIC BOTTLE ONLY CRITICAL RESULT CALLED TO, READ BACK BY AND VERIFIED WITH: PHARMD M LILLISTON 09/01/2024 @ 0136 BY AB Performed at [facility], [address], [place], [place] [address]    Culture GRAM POSITIVE RODS  Final   Report Status PENDING  Incomplete  MRSA Next Gen by PCR, Nasal     Status: None   Collection Time: 08/29/24  8:20 AM   Specimen: Nasal Mucosa; Nasal Swab  Result Value Ref Range Status   MRSA by PCR Next Gen NOT DETECTED NOT DETECTED Final    Comment: (NOTE) The GeneXpert MRSA Assay (FDA approved for NASAL specimens only), is one component of a comprehensive MRSA colonization  surveillance program. It is not intended to diagnose MRSA infection nor to guide or monitor treatment for MRSA infections. Test performance is not FDA approved in patients less than 13 years old. Performed at [facility], [address], [place], [place] [address]       Radiology Studies: IR [facility] THORACIC WITH BONE BIOPSY Result Date: 09/01/2024 CLINICAL DATA:  Acute T7 compression fracture Briefly, 83 year old female with acute, symptomatic thoracic vertebral body compression fractures, greatest at T7. Additional fractures on MRI at T5 and T6, to a mild degree. Failed conservative management  EXAM: T7 VERTEBRAL BODY AUGMENTATION WITH BALLOON KYPHOPLASTY COMPARISON:  CT T-spine, 08/22/2024.  MR T-spine, 08/27/2024. MEDICATIONS: As antibiotic prophylaxis, Ancef  2 gm IV was ordered pre-procedure and administered intravenously within 1 hour of incision. ANESTHESIA/SEDATION: Moderate (conscious) sedation was employed during this procedure. A total of Versed  1.5 mg and Fentanyl  100 mcg was administered intravenously. Moderate Sedation Time: 34 minutes. The patient's level of consciousness and vital signs were monitored continuously by radiology nursing throughout the procedure under my direct supervision. FLUOROSCOPY: Radiation Exposure Index and estimated peak skin dose (PSD); Reference air kerma (RAK), 187 mGy. COMPLICATIONS: None immediate. PROCEDURE: The procedure, risks (including but not limited to bleeding, infection, organ damage), benefits, and alternatives were explained to the patient and/or patient's representative. Questions regarding the procedure were encouraged and answered. The patient understands and consents to the procedure. The patient was placed prone on the fluoroscopic table. The skin overlying the lower lumbar spine region was then prepped and draped in the usual sterile fashion. Maximal barrier sterile technique was utilized including caps, mask, sterile gowns,  sterile gloves, sterile drape, hand hygiene and skin antiseptic. Intravenous Fentanyl  and Versed  were administered as conscious sedation during continuous cardiorespiratory monitoring by the radiology RN. The LEFT pedicle at T7 was then infiltrated with 0.5% Marcaine  followed by the advancement of a Kyphon trocar needle through the pedicle into the posterior one-third of the vertebral body. The osteo drill was advanced to the anterior third of the vertebral body. The osteo drill was retracted. Through the working cannula, a Kyphon inflatable bone tamp 15 x 3 was advanced and positioned with the distal marker approximately 5 mm from the anterior aspect of the cortex. Appropriate positioning was confirmed on the AP projection. At this time, the balloon was expanded using contrast via a Kyphon inflation syringe device via micro tubing. In similar fashion, the RIGHT T7 pedicle was infiltrated with 0.5% Marcaine  followed by the advancement of a second Kyphon trocar needle through the right pedicle into the posterior third of the vertebral body. Subsequently, the osteo drill was coaxially advanced to the anterior right third. The osteo drill was exchanged for a Kyphon inflatable bone tamp 15 x 3, advanced to the 5 mm of the anterior aspect of the cortex. The balloon was then expanded using contrast as above. Inflations were continued until there was near apposition with the superior end plate. At this time, methylmethacrylate mixture was reconstituted in the Kyphon bone mixing device system. This was then loaded into the delivery mechanism, attached to Kyphon bone fillers. The balloons were deflated and removed followed by the instillation of methylmethacrylate mixture with excellent filling in the AP and lateral projections. No extravasation was noted in the disk spaces or posteriorly into the spinal canal. No epidural venous contamination was seen. The working cannulae and the bone filler were then retrieved and  removed. Hemostasis was achieved with manual compression. The patient tolerated the procedure well without immediate postprocedural complication. FINDINGS: *Adequate cement filling of the T7 vertebral body on both the AP and lateral projections. *No extravasation was noted in the disk spaces or posteriorly into the spinal canal, nor epidural venous contamination was seen. *The patient has suffered a fractures of T5, T6 and T7. It is recommended that patients aged 14 years or older be evaluated for possible testing or treatment of osteoporosis. A copy of this procedure report is made available to the patient's referring physician IMPRESSION: Successful T7 vertebral body cement augmentation with balloon kyphoplasty. Per CMS PQRS reporting requirements (  PQRS Measure 24): Given the patient's age of greater than 50 and the fracture site (hip, distal radius, or spine), the patient should be tested for osteoporosis using DXA, and the appropriate treatment considered based on the DXA result. RECOMMENDATIONS: Should the patient fail to achieve adequate pain relief from her thoracic vertebral body compression deformities after this augmentation, kyphoplasty at T5 and T6 levels could be pursued. Thom Hall, MD [facility] Electronically Signed   By: Thom Hall M.D.   On: 09/01/2024 17:07    Scheduled Meds:  arformoterol  15 mcg Nebulization BID   Chlorhexidine  Gluconate Cloth  6 each Topical Daily   enoxaparin  (LOVENOX ) injection  40 mg Subcutaneous Q24H   feeding supplement  1 Container Oral BID BM   furosemide   20 mg Oral BID   lidocaine   1 patch Transdermal Q24H   methylPREDNISolone  (SOLU-MEDROL ) injection  40 mg Intravenous Daily   polyethylene glycol  17 g Oral BID   revefenacin  175 mcg Nebulization Daily   senna-docusate  1 tablet Oral BID   Continuous Infusions:     LOS: 7 days   Time spent:  Tammy JAYSON Montclair, DO [facility]  If 7PM-7AM, please contact night-coverage [email or web address]  09/02/2024, 7:39 AM

## 2024-09-03 ENCOUNTER — Other Ambulatory Visit (HOSPITAL_COMMUNITY): Payer: Self-pay

## 2024-09-03 DIAGNOSIS — B9683 Acinetobacter baumannii as the cause of diseases classified elsewhere: Secondary | ICD-10-CM | POA: Diagnosis not present

## 2024-09-03 DIAGNOSIS — R6521 Severe sepsis with septic shock: Secondary | ICD-10-CM | POA: Diagnosis present

## 2024-09-03 DIAGNOSIS — C3491 Malignant neoplasm of unspecified part of right bronchus or lung: Secondary | ICD-10-CM | POA: Diagnosis not present

## 2024-09-03 DIAGNOSIS — F32A Depression, unspecified: Secondary | ICD-10-CM | POA: Diagnosis present

## 2024-09-03 DIAGNOSIS — F419 Anxiety disorder, unspecified: Secondary | ICD-10-CM | POA: Diagnosis not present

## 2024-09-03 DIAGNOSIS — I129 Hypertensive chronic kidney disease with stage 1 through stage 4 chronic kidney disease, or unspecified chronic kidney disease: Secondary | ICD-10-CM | POA: Diagnosis not present

## 2024-09-03 DIAGNOSIS — G4700 Insomnia, unspecified: Secondary | ICD-10-CM | POA: Diagnosis not present

## 2024-09-03 DIAGNOSIS — Z7901 Long term (current) use of anticoagulants: Secondary | ICD-10-CM | POA: Diagnosis not present

## 2024-09-03 DIAGNOSIS — Z452 Encounter for adjustment and management of vascular access device: Secondary | ICD-10-CM | POA: Diagnosis not present

## 2024-09-03 DIAGNOSIS — C3490 Malignant neoplasm of unspecified part of unspecified bronchus or lung: Secondary | ICD-10-CM | POA: Diagnosis not present

## 2024-09-03 DIAGNOSIS — J9602 Acute respiratory failure with hypercapnia: Secondary | ICD-10-CM | POA: Diagnosis not present

## 2024-09-03 DIAGNOSIS — Z7401 Bed confinement status: Secondary | ICD-10-CM | POA: Diagnosis not present

## 2024-09-03 DIAGNOSIS — I2694 Multiple subsegmental pulmonary emboli without acute cor pulmonale: Secondary | ICD-10-CM | POA: Diagnosis not present

## 2024-09-03 DIAGNOSIS — R918 Other nonspecific abnormal finding of lung field: Secondary | ICD-10-CM | POA: Diagnosis not present

## 2024-09-03 DIAGNOSIS — E782 Mixed hyperlipidemia: Secondary | ICD-10-CM | POA: Diagnosis not present

## 2024-09-03 DIAGNOSIS — I724 Aneurysm of artery of lower extremity: Secondary | ICD-10-CM | POA: Diagnosis not present

## 2024-09-03 DIAGNOSIS — J9622 Acute and chronic respiratory failure with hypercapnia: Secondary | ICD-10-CM | POA: Diagnosis present

## 2024-09-03 DIAGNOSIS — I5032 Chronic diastolic (congestive) heart failure: Secondary | ICD-10-CM | POA: Diagnosis not present

## 2024-09-03 DIAGNOSIS — D649 Anemia, unspecified: Secondary | ICD-10-CM | POA: Diagnosis present

## 2024-09-03 DIAGNOSIS — I10 Essential (primary) hypertension: Secondary | ICD-10-CM | POA: Diagnosis not present

## 2024-09-03 DIAGNOSIS — R2689 Other abnormalities of gait and mobility: Secondary | ICD-10-CM | POA: Diagnosis not present

## 2024-09-03 DIAGNOSIS — D631 Anemia in chronic kidney disease: Secondary | ICD-10-CM | POA: Diagnosis not present

## 2024-09-03 DIAGNOSIS — A419 Sepsis, unspecified organism: Secondary | ICD-10-CM | POA: Diagnosis present

## 2024-09-03 DIAGNOSIS — D6959 Other secondary thrombocytopenia: Secondary | ICD-10-CM | POA: Diagnosis present

## 2024-09-03 DIAGNOSIS — N170 Acute kidney failure with tubular necrosis: Secondary | ICD-10-CM | POA: Diagnosis not present

## 2024-09-03 DIAGNOSIS — I5033 Acute on chronic diastolic (congestive) heart failure: Secondary | ICD-10-CM | POA: Diagnosis present

## 2024-09-03 DIAGNOSIS — D696 Thrombocytopenia, unspecified: Secondary | ICD-10-CM | POA: Diagnosis not present

## 2024-09-03 DIAGNOSIS — S22000A Wedge compression fracture of unspecified thoracic vertebra, initial encounter for closed fracture: Secondary | ICD-10-CM | POA: Diagnosis not present

## 2024-09-03 DIAGNOSIS — I272 Pulmonary hypertension, unspecified: Secondary | ICD-10-CM | POA: Diagnosis present

## 2024-09-03 DIAGNOSIS — I2699 Other pulmonary embolism without acute cor pulmonale: Secondary | ICD-10-CM | POA: Diagnosis not present

## 2024-09-03 DIAGNOSIS — R059 Cough, unspecified: Secondary | ICD-10-CM | POA: Diagnosis not present

## 2024-09-03 DIAGNOSIS — Y832 Surgical operation with anastomosis, bypass or graft as the cause of abnormal reaction of the patient, or of later complication, without mention of misadventure at the time of the procedure: Secondary | ICD-10-CM | POA: Diagnosis not present

## 2024-09-03 DIAGNOSIS — I82451 Acute embolism and thrombosis of right peroneal vein: Secondary | ICD-10-CM | POA: Diagnosis present

## 2024-09-03 DIAGNOSIS — J189 Pneumonia, unspecified organism: Secondary | ICD-10-CM | POA: Diagnosis present

## 2024-09-03 DIAGNOSIS — R011 Cardiac murmur, unspecified: Secondary | ICD-10-CM | POA: Diagnosis not present

## 2024-09-03 DIAGNOSIS — J449 Chronic obstructive pulmonary disease, unspecified: Secondary | ICD-10-CM | POA: Diagnosis not present

## 2024-09-03 DIAGNOSIS — Z4682 Encounter for fitting and adjustment of non-vascular catheter: Secondary | ICD-10-CM | POA: Diagnosis not present

## 2024-09-03 DIAGNOSIS — D689 Coagulation defect, unspecified: Secondary | ICD-10-CM | POA: Diagnosis present

## 2024-09-03 DIAGNOSIS — G9341 Metabolic encephalopathy: Secondary | ICD-10-CM | POA: Diagnosis not present

## 2024-09-03 DIAGNOSIS — J90 Pleural effusion, not elsewhere classified: Secondary | ICD-10-CM | POA: Diagnosis not present

## 2024-09-03 DIAGNOSIS — M8000XS Age-related osteoporosis with current pathological fracture, unspecified site, sequela: Secondary | ICD-10-CM | POA: Diagnosis not present

## 2024-09-03 DIAGNOSIS — M1990 Unspecified osteoarthritis, unspecified site: Secondary | ICD-10-CM | POA: Diagnosis not present

## 2024-09-03 DIAGNOSIS — I517 Cardiomegaly: Secondary | ICD-10-CM | POA: Diagnosis not present

## 2024-09-03 DIAGNOSIS — K219 Gastro-esophageal reflux disease without esophagitis: Secondary | ICD-10-CM | POA: Diagnosis not present

## 2024-09-03 DIAGNOSIS — I2609 Other pulmonary embolism with acute cor pulmonale: Secondary | ICD-10-CM | POA: Diagnosis present

## 2024-09-03 DIAGNOSIS — I130 Hypertensive heart and chronic kidney disease with heart failure and stage 1 through stage 4 chronic kidney disease, or unspecified chronic kidney disease: Secondary | ICD-10-CM | POA: Diagnosis present

## 2024-09-03 DIAGNOSIS — R5381 Other malaise: Secondary | ICD-10-CM | POA: Diagnosis not present

## 2024-09-03 DIAGNOSIS — Z515 Encounter for palliative care: Secondary | ICD-10-CM | POA: Diagnosis not present

## 2024-09-03 DIAGNOSIS — I2510 Atherosclerotic heart disease of native coronary artery without angina pectoris: Secondary | ICD-10-CM | POA: Diagnosis not present

## 2024-09-03 DIAGNOSIS — R7989 Other specified abnormal findings of blood chemistry: Secondary | ICD-10-CM | POA: Diagnosis not present

## 2024-09-03 DIAGNOSIS — R0602 Shortness of breath: Secondary | ICD-10-CM | POA: Diagnosis not present

## 2024-09-03 DIAGNOSIS — J440 Chronic obstructive pulmonary disease with acute lower respiratory infection: Secondary | ICD-10-CM | POA: Diagnosis present

## 2024-09-03 DIAGNOSIS — J441 Chronic obstructive pulmonary disease with (acute) exacerbation: Secondary | ICD-10-CM | POA: Diagnosis present

## 2024-09-03 DIAGNOSIS — E785 Hyperlipidemia, unspecified: Secondary | ICD-10-CM | POA: Diagnosis not present

## 2024-09-03 DIAGNOSIS — A4154 Sepsis due to Acinetobacter baumannii: Secondary | ICD-10-CM | POA: Diagnosis present

## 2024-09-03 DIAGNOSIS — J45909 Unspecified asthma, uncomplicated: Secondary | ICD-10-CM | POA: Diagnosis not present

## 2024-09-03 DIAGNOSIS — E871 Hypo-osmolality and hyponatremia: Secondary | ICD-10-CM | POA: Diagnosis not present

## 2024-09-03 DIAGNOSIS — N179 Acute kidney failure, unspecified: Secondary | ICD-10-CM | POA: Diagnosis not present

## 2024-09-03 DIAGNOSIS — I4891 Unspecified atrial fibrillation: Secondary | ICD-10-CM | POA: Diagnosis not present

## 2024-09-03 DIAGNOSIS — R042 Hemoptysis: Secondary | ICD-10-CM | POA: Diagnosis not present

## 2024-09-03 DIAGNOSIS — J9621 Acute and chronic respiratory failure with hypoxia: Secondary | ICD-10-CM | POA: Diagnosis present

## 2024-09-03 DIAGNOSIS — R578 Other shock: Secondary | ICD-10-CM | POA: Diagnosis not present

## 2024-09-03 DIAGNOSIS — R1314 Dysphagia, pharyngoesophageal phase: Secondary | ICD-10-CM | POA: Diagnosis not present

## 2024-09-03 DIAGNOSIS — J9601 Acute respiratory failure with hypoxia: Secondary | ICD-10-CM | POA: Diagnosis not present

## 2024-09-03 DIAGNOSIS — N1831 Chronic kidney disease, stage 3a: Secondary | ICD-10-CM | POA: Diagnosis present

## 2024-09-03 DIAGNOSIS — S22060D Wedge compression fracture of T7-T8 vertebra, subsequent encounter for fracture with routine healing: Secondary | ICD-10-CM | POA: Diagnosis not present

## 2024-09-03 DIAGNOSIS — I252 Old myocardial infarction: Secondary | ICD-10-CM | POA: Diagnosis not present

## 2024-09-03 DIAGNOSIS — J9620 Acute and chronic respiratory failure, unspecified whether with hypoxia or hypercapnia: Secondary | ICD-10-CM | POA: Diagnosis not present

## 2024-09-03 DIAGNOSIS — Z66 Do not resuscitate: Secondary | ICD-10-CM | POA: Diagnosis present

## 2024-09-03 DIAGNOSIS — M6281 Muscle weakness (generalized): Secondary | ICD-10-CM | POA: Diagnosis not present

## 2024-09-03 DIAGNOSIS — N139 Obstructive and reflux uropathy, unspecified: Secondary | ICD-10-CM | POA: Diagnosis not present

## 2024-09-03 DIAGNOSIS — Z902 Acquired absence of lung [part of]: Secondary | ICD-10-CM | POA: Diagnosis not present

## 2024-09-03 LAB — BASIC METABOLIC PANEL WITH GFR
Anion gap: 7 (ref 5–15)
BUN: 39 mg/dL — ABNORMAL HIGH (ref 8–23)
CO2: 44 mmol/L — ABNORMAL HIGH (ref 22–32)
Calcium: 9.9 mg/dL (ref 8.9–10.3)
Chloride: 93 mmol/L — ABNORMAL LOW (ref 98–111)
Creatinine, Ser: 1.13 mg/dL — ABNORMAL HIGH (ref 0.44–1.00)
GFR, Estimated: 49 mL/min — ABNORMAL LOW (ref >60)
Glucose, Bld: 82 mg/dL (ref 70–99)
Potassium: 4.3 mmol/L (ref 3.5–5.1)
Sodium: 144 mmol/L (ref 135–145)

## 2024-09-03 LAB — CULTURE, BLOOD (ROUTINE X 2): Culture  Setup Time: NO GROWTH

## 2024-09-03 LAB — CBC
HCT: 37.9 % (ref 36.0–46.0)
Hemoglobin: 11.7 g/dL — ABNORMAL LOW (ref 12.0–15.0)
MCH: 30.3 pg (ref 26.0–34.0)
MCHC: 30.9 g/dL (ref 30.0–36.0)
MCV: 98.2 fL (ref 80.0–100.0)
Platelets: 278 K/uL (ref 150–400)
RBC: 3.86 MIL/uL — ABNORMAL LOW (ref 3.87–5.11)
RDW: 15.8 % — ABNORMAL HIGH (ref 11.5–15.5)
WBC: 10.4 K/uL (ref 4.0–10.5)
nRBC: 0 % (ref 0.0–0.2)

## 2024-09-03 MED ORDER — PREDNISONE 10 MG PO TABS
ORAL_TABLET | ORAL | 0 refills | Status: AC
Start: 2024-09-03 — End: 2024-09-18
  Filled 2024-09-03: qty 35, 15d supply, fill #0

## 2024-09-03 MED ORDER — HYDROCODONE-ACETAMINOPHEN 5-325 MG PO TABS: 1.0000 | ORAL_TABLET | Freq: Four times a day (QID) | ORAL | 0 refills | Status: AC | PRN

## 2024-09-03 MED ORDER — POLYETHYLENE GLYCOL 3350 17 G PO PACK
17.0000 g | PACK | Freq: Two times a day (BID) | ORAL | 0 refills | Status: DC
  Filled 2024-09-03: qty 14, 7d supply, fill #0

## 2024-09-03 MED ORDER — SENNOSIDES-DOCUSATE SODIUM 8.6-50 MG PO TABS
1.0000 | ORAL_TABLET | Freq: Two times a day (BID) | ORAL | 0 refills | Status: DC
  Filled 2024-09-03: qty 60, 30d supply, fill #0

## 2024-09-03 MED ORDER — TRAMADOL HCL 50 MG PO TABS: 50.0000 mg | ORAL_TABLET | Freq: Four times a day (QID) | ORAL | 0 refills | Status: DC | PRN

## 2024-09-03 MED ORDER — FUROSEMIDE 20 MG PO TABS
20.0000 mg | ORAL_TABLET | Freq: Every day | ORAL | 0 refills | Status: DC
  Filled 2024-09-03: qty 30, 30d supply, fill #0

## 2024-09-03 MED ORDER — TRAZODONE HCL 50 MG PO TABS: 25.0000 mg | ORAL_TABLET | Freq: Every evening | ORAL | 1 refills | Status: DC | PRN

## 2024-09-03 NOTE — Discharge Summary (Addendum)
 Physician Discharge Summary  [name] FMW:[id number] DOB: Oct 16, 1941 DOA: 08/26/2024  PCP: Purcell [name], MD  Admit date: 08/26/2024 Discharge date: 09/03/2024  Admitted From: Home Disposition:  SNF  Recommendations for Outpatient Follow-up:  Follow up with PCP in 1-2 weeks  Discharge Condition:Stable  CODE STATUS:Full  Diet recommendation:  As tolerated  Brief/Interim Summary: Tammy Ball is a 83 y.o. female with medical history significant of thoracic spinal fracture involving T7 that was recently diagnosed, history of lung cancer status post lobectomy and chemotherapy, history of renal cell carcinoma, chemotherapy induced neuropathy, diastolic dysfunction CHF, COPD with chronic respiratory failure on 2 L of oxygen  at home, depression, essential hypertension, hyperlipidemia, coronary artery disease, radiation induced neuropathy, history of left nephrectomy who has been following up with PCP as well as emergency room after significant back pain that started about 2 weeks ago. Patiently as above with acute onset back pain, imaging consistent with wedge fracture, hospitalist called for admission  Patient admitted as above after worsening back pain with acute respiratory distress in the setting of pleuritic chest pain, noted T7 compression spinal fracture.  In the interim patient had worsening respiratory status likely in the setting of intractable pain, improved with BiPAP, Precedex and titration of analgesics.  Patient ultimately evaluated by IR with kyphoplasty 09/01/2024 with marked improvement in symptoms and respiratory status.  She is now back to her baseline oxygen  at 2 L nasal cannula but continues to be profoundly weak with exertion likely due to recent fracture.  Given patient's findings PT recommending ongoing therapy at SNF, patient otherwise stable and agreeable for discharge to rehab.  Recommend close follow-up with PCP in the next 1 to 2 weeks, otherwise continue  home oxygen  at home medications and analgesics as below.  **Patient failed foley removal trial - will replace and have patient follow up with urology in 1-2 weeks if necessary for repeat evaluation**  Discharge Diagnoses:  Principal Problem:   Acute on chronic respiratory failure (HCC) Active Problems:   Essential hypertension   COPD GOLD B-C   Renal cell cancer (HCC)   Depression   Hyperlipidemia   Non-small cell carcinoma of right lung, stage 2 (HCC)   S/P lobectomy of lung   Age-related osteoporosis with current pathological fracture   Stage 3a chronic kidney disease (HCC)   Chronic diastolic CHF (congestive heart failure) (HCC)   Hyponatremia   Closed compression fracture of thoracic vertebra (HCC)  Acute on chronic hypoxic hypercarbic respiratory failure, multifactorial, POA, resolved - Given acuity of hypoxia suspect this is most notably affected by her recent compression fracture - Back to baseline, tutor nasal cannula at home - Likely compression atelectasis given fracture vs pneumonia given lack of symptoms otherwise - Concurrent history of heart failure and COPD less likely playing a role given above -no improvement with diuretics   Hyperkalemia - Lokelma previously given, currently within normal limits Acute T7 compression fracture, POA With respiratory compromise - Reports no trauma or falls, unclear etiology - Interventional radiology following along, status post T7 vertebral body augmentation with balloon kyphoplasty 09/01/2024, tolerated well, no complications.  - Patient adamant she does not want any further strong medications will continue home hydrocodone , tramadol    Acute hospital delirium in the setting of narcotics resolved - Mental status back to baseline, discussed with family avoiding IV narcotics in the future if at all possible, remains high risk for delirium while on narcotics  Abdominal pain/GERD, resolving - Continue PPI, supportive care, likely  secondary  to recent steroid use and prolonged n.p.o. status   Hypotension - Likely multifactorial in setting of poor p.o. intake, narcotics and home blood pressure regimen - Carvedilol  discontinued, IVF previously required to maintain MAP, now improving off Precedex   Acute heart failure exacerbation unlikely  - Repeat echo unchanged from 2023, EF 60-65% with normal LV function, no regional wall motion abnormalities with grade 2 diastolic dysfunction.  - Resume PO furosemide  -follow daily weights at home   Compression atelectasis  Pneumonia unlikely - Empiric treatment with IV antibiotics completed - no fevers, cough, sputum production with only minimally elevated WBC(now resolved) initially in the setting of acute stress/pain. - Transition to p.o. steroid taper as above   Hyperlipidemia: Statin   Essential hypertension: Continue carvedilol ; hold other home medications - BP well controlled while on narcotics may avoid home regimen to avoid hypotension   History of lung cancer: In remission, history of chemo and radiation  Renal cell carcinoma: Status post nephrectomy  Chronic kidney disease stage IIIa: Appears to be near baseline  Hyponatremia: In the setting of hypovolemia, improving Osteoporosis: Recommend further bone studies including bone density studies/repeat imaging in the outpatient setting as appropriate.  Hypercalcemia: Not clinically relevant -currently back to baseline/within normal limits  Discharge Instructions   Allergies as of 09/03/2024       Reactions   Bee Venom Anaphylaxis, Shortness Of Breath, Swelling, Other (See Comments)   Swelling at site (reaction to bees and wasps)   Amlodipine  Swelling, Other (See Comments)   Swelling of the ankles and hands    Levofloxacin Other (See Comments)   Joint pain    Alendronate  Other (See Comments)   Joint pains / hypercalcemia    Hctz [hydrochlorothiazide ] Palpitations, Other (See Comments)   Sweating          Medication List     TAKE these medications    acetaminophen  500 MG tablet Commonly known as: TYLENOL  Take 500 mg by mouth every 6 (six) hours as needed for headache or moderate pain (pain).   albuterol  108 (90 Base) MCG/ACT inhaler Commonly known as: Ventolin  HFA Inhale 1-2 puffs into the lungs every 6 (six) hours as needed for wheezing or shortness of breath.   albuterol  (2.5 MG/3ML) 0.083% nebulizer solution Commonly known as: PROVENTIL  INHALE 3 ML BY NEBULIZATION EVERY 6 HOURS AS NEEDED FOR WHEEZING OR SHORTNESS OF BREATH   ARTIFICIAL TEARS OP Place 1 drop into both eyes daily as needed (dry eyes).   aspirin  EC 81 MG tablet Take 81 mg by mouth every morning.   atorvastatin  40 MG tablet Commonly known as: LIPITOR TAKE 1 TABLET BY MOUTH EVERY DAY   Breztri  Aerosphere 160-9-4.8 MCG/ACT Aero inhaler Generic drug: budesonide -glycopyrrolate -formoterol  Inhale 2 puffs into the lungs in the morning and at bedtime.   carvedilol  25 MG tablet Commonly known as: COREG  Take 1 tablet (25 mg total) by mouth 2 (two) times daily.   cholecalciferol  25 MCG (1000 UNIT) tablet Commonly known as: VITAMIN D3 Take 1,000 Units by mouth daily.   cycloSPORINE 0.05 % ophthalmic emulsion Commonly known as: RESTASIS Place 1 drop into both eyes See admin instructions. Every 2 hours   Fish Oil 1200 MG Caps Take 1,200 mg by mouth every morning.   furosemide  20 MG tablet Commonly known as: LASIX  Take 1 tablet (20 mg total) by mouth daily.   HYDROcodone -acetaminophen  5-325 MG tablet Commonly known as: NORCO/VICODIN Take 1 tablet by mouth every 6 (six) hours as needed for up to 5  days.   loperamide  2 MG capsule Commonly known as: IMODIUM  Take 1 capsule (2 mg total) by mouth 4 (four) times daily as needed for diarrhea or loose stools.   losartan  100 MG tablet Commonly known as: COZAAR  TAKE 1 TABLET BY MOUTH EVERY DAY   Mucinex  Maximum Strength 1200 MG Tb12 Generic drug:  Guaifenesin  Take 1,200 mg by mouth every morning.   ondansetron  4 MG disintegrating tablet Commonly known as: ZOFRAN -ODT 4mg  ODT q4 hours prn nausea/vomit   OXYGEN  Inhale 2 L into the lungs as needed (to keep SATS at or above 90).   polyethylene glycol 17 g packet Commonly known as: MIRALAX  / GLYCOLAX  Take 17 g by mouth 2 (two) times daily.   predniSONE  10 MG tablet Commonly known as: DELTASONE  Take 4 tablets (40 mg total) by mouth daily for 5 days, THEN 2 tablets (20 mg total) daily for 5 days, THEN 1 tablet (10 mg total) daily for 5 days. Start taking on: September 03, 2024   senna-docusate 8.6-50 MG tablet Commonly known as: Senokot-S Take 1 tablet by mouth 2 (two) times daily.   sodium chloride  0.65 % Soln nasal spray Commonly known as: OCEAN Place 1 spray into both nostrils at bedtime as needed for congestion.   traMADol  50 MG tablet Commonly known as: ULTRAM  Take 1 tablet (50 mg total) by mouth every 6 (six) hours as needed.   traZODone  50 MG tablet Commonly known as: DESYREL  Take 0.5-1 tablets (25-50 mg total) by mouth at bedtime as needed for sleep.   TUMS PO Take 1 tablet by mouth 4 (four) times daily as needed (heartburn).   vitamin C 1000 MG tablet Take 1,000 mg by mouth every morning.        Contact information for after-discharge care     Destination     [facility] .   Service: Skilled Nursing Contact information: [address] [place]  [address] [phone]                    Allergies  Allergen Reactions   Bee Venom Anaphylaxis, Shortness Of Breath, Swelling and Other (See Comments)    Swelling at site (reaction to bees and wasps)   Amlodipine  Swelling and Other (See Comments)    Swelling of the ankles and hands    Levofloxacin Other (See Comments)    Joint pain    Alendronate  Other (See Comments)    Joint pains / hypercalcemia    Hctz [Hydrochlorothiazide ] Palpitations and Other (See Comments)     Sweating     Consultations: IR, PCCM   Procedures/Studies: IR KYPHO THORACIC WITH BONE BIOPSY Result Date: 09/01/2024 CLINICAL DATA:  Acute T7 compression fracture Briefly, 83 year old female with acute, symptomatic thoracic vertebral body compression fractures, greatest at T7. Additional fractures on MRI at T5 and T6, to a mild degree. Failed conservative management EXAM: T7 VERTEBRAL BODY AUGMENTATION WITH BALLOON KYPHOPLASTY COMPARISON:  CT T-spine, 08/22/2024.  MR T-spine, 08/27/2024. MEDICATIONS: As antibiotic prophylaxis, Ancef  2 gm IV was ordered pre-procedure and administered intravenously within 1 hour of incision. ANESTHESIA/SEDATION: Moderate (conscious) sedation was employed during this procedure. A total of Versed  1.5 mg and Fentanyl  100 mcg was administered intravenously. Moderate Sedation Time: 34 minutes. The patient's level of consciousness and vital signs were monitored continuously by radiology nursing throughout the procedure under my direct supervision. FLUOROSCOPY: Radiation Exposure Index and estimated peak skin dose (PSD); Reference air kerma (RAK), 187 mGy. COMPLICATIONS: None immediate. PROCEDURE: The procedure,  risks (including but not limited to bleeding, infection, organ damage), benefits, and alternatives were explained to the patient and/or patient's representative. Questions regarding the procedure were encouraged and answered. The patient understands and consents to the procedure. The patient was placed prone on the fluoroscopic table. The skin overlying the lower lumbar spine region was then prepped and draped in the usual sterile fashion. Maximal barrier sterile technique was utilized including caps, mask, sterile gowns, sterile gloves, sterile drape, hand hygiene and skin antiseptic. Intravenous Fentanyl  and Versed  were administered as conscious sedation during continuous cardiorespiratory monitoring by the radiology RN. The LEFT pedicle at T7 was then infiltrated with  0.5% Marcaine  followed by the advancement of a Kyphon trocar needle through the pedicle into the posterior one-third of the vertebral body. The osteo drill was advanced to the anterior third of the vertebral body. The osteo drill was retracted. Through the working cannula, a Kyphon inflatable bone tamp 15 x 3 was advanced and positioned with the distal marker approximately 5 mm from the anterior aspect of the cortex. Appropriate positioning was confirmed on the AP projection. At this time, the balloon was expanded using contrast via a Kyphon inflation syringe device via micro tubing. In similar fashion, the RIGHT T7 pedicle was infiltrated with 0.5% Marcaine  followed by the advancement of a second Kyphon trocar needle through the right pedicle into the posterior third of the vertebral body. Subsequently, the osteo drill was coaxially advanced to the anterior right third. The osteo drill was exchanged for a Kyphon inflatable bone tamp 15 x 3, advanced to the 5 mm of the anterior aspect of the cortex. The balloon was then expanded using contrast as above. Inflations were continued until there was near apposition with the superior end plate. At this time, methylmethacrylate mixture was reconstituted in the Kyphon bone mixing device system. This was then loaded into the delivery mechanism, attached to Kyphon bone fillers. The balloons were deflated and removed followed by the instillation of methylmethacrylate mixture with excellent filling in the AP and lateral projections. No extravasation was noted in the disk spaces or posteriorly into the spinal canal. No epidural venous contamination was seen. The working cannulae and the bone filler were then retrieved and removed. Hemostasis was achieved with manual compression. The patient tolerated the procedure well without immediate postprocedural complication. FINDINGS: *Adequate cement filling of the T7 vertebral body on both the AP and lateral projections. *No  extravasation was noted in the disk spaces or posteriorly into the spinal canal, nor epidural venous contamination was seen. *The patient has suffered a fractures of T5, T6 and T7. It is recommended that patients aged 5 years or older be evaluated for possible testing or treatment of osteoporosis. A copy of this procedure report is made available to the patient's referring physician IMPRESSION: Successful T7 vertebral body cement augmentation with balloon kyphoplasty. Per CMS PQRS reporting requirements (PQRS Measure 24): Given the patient's age of greater than 50 and the fracture site (hip, distal radius, or spine), the patient should be tested for osteoporosis using DXA, and the appropriate treatment considered based on the DXA result. RECOMMENDATIONS: Should the patient fail to achieve adequate pain relief from her thoracic vertebral body compression deformities after this augmentation, kyphoplasty at T5 and T6 levels could be pursued. Thom Hall, MD [facility] Electronically Signed   By: Thom Hall M.D.   On: 09/01/2024 17:07   DG CHEST PORT 1 VIEW Result Date: 08/29/2024 CLINICAL DATA:  Shortness  of breath. EXAM: PORTABLE CHEST 1 VIEW COMPARISON:  08/26/2024 FINDINGS: The cardio pericardial silhouette is enlarged. Interval progression of right base atelectasis/infiltrate and mildly increased small right pleural effusion. Similar collapse/consolidation at the left base. Diffuse interstitial opacity is likely chronic. Changes of upper thoracic vertebral augmentation evident. Telemetry leads overlie the chest. IMPRESSION: 1. Interval progression of right base atelectasis/infiltrate and mildly increased small right pleural effusion. 2. Similar collapse/consolidation at the left base. Electronically Signed   By: Camellia Candle M.D.   On: 08/29/2024 09:13   MR THORACIC SPINE WO CONTRAST Result Date: 08/28/2024 EXAM: MRI THORACIC SPINE WITHOUT  INTRAVENOUS CONTRAST 08/27/2024 07:11:45 PM TECHNIQUE: Multiplanar multisequence MRI of the thoracic spine was performed without the administration of intravenous contrast. COMPARISON: MRI of the thoracic spine dated 12/21/2016 and CT of the thoracic spine dated 08/22/2024. CLINICAL HISTORY: Compression fracture, thoracic. FINDINGS: BONES AND ALIGNMENT: Normal alignment except for focal kyphosis at T4. There is a mild-to-moderate compression deformity of T7. Increased T2 signal is present throughout the T7 vertebral body, indicating the fracture is likely acute or subacute. Bone marrow edema is present anteriorly within the T5 and T6 vertebral bodies. There are chronic compression deformities of T3 and T4, which have been treated with augmentation. The T4 vertebral body has lost approximately 75% of its height anteriorly, contributing to focal kyphosis as before. Um Bone marrow signal is unremarkable except for the findings described above. The patient is status post ACDF at C6-C7. SPINAL CORD: Normal spinal cord volume. Normal spinal cord signal. SOFT TISSUES: Bilateral pleural effusions, right larger than left. There is airspace consolidation within the left upper lobe. Other visualized soft tissues are unremarkable. DEGENERATIVE CHANGES: No significant disc herniation. No spinal canal stenosis or neural foraminal narrowing. IMPRESSION: 1. Mild-to-moderate compression deformity of T7 with increased T2 signal, likely acute or subacute. 2. Bone marrow edema anteriorly within the T5 and T6 vertebral bodies. 3. Chronic compression deformities of T3 and T4, treated with augmentation, with focal kyphosis at T4 as before. 4. Bilateral pleural effusions, right larger than left. 5. Airspace consolidation within the left upper lobe. Electronically signed by: Evalene Coho MD 08/28/2024 04:42 AM EDT RP Workstation: [id number]   ECHOCARDIOGRAM COMPLETE Result Date: 08/27/2024    ECHOCARDIOGRAM REPORT   Patient Name:    SHYVONNE CHASTANG Date of Exam: 08/27/2024 Medical Rec #:  [id number]      Height:       63.0 in Accession #:    [id number]     Weight:       164.2 lb Date of Birth:  Dec 05, 1940      BSA:          1.778 m Patient Age:    82 years       BP:           161/78 mmHg Patient Gender: F              HR:           84 bpm. Exam Location:  Inpatient Procedure: 2D Echo, Cardiac Doppler and Color Doppler (Both Spectral and Color            Flow Doppler were utilized during procedure). Indications:    CHF I50.31  History:        Patient has prior history of Echocardiogram examinations, most                 recent 06/10/2022. CHF, Previous Myocardial Infarction, COPD;  Risk Factors:Hypertension. H/O Renal cell cancer.  Sonographer:    BERNARDA ROCKS Referring Phys: [id number] MOHAMMAD L GARBA IMPRESSIONS  1. Left ventricular ejection fraction, by estimation, is 60 to 65%. The left ventricle has normal function. The left ventricle has no regional wall motion abnormalities. There is mild concentric left ventricular hypertrophy. Left ventricular diastolic parameters are consistent with Grade II diastolic dysfunction (pseudonormalization). Elevated left atrial pressure.  2. Right ventricular systolic function is normal. The right ventricular size is normal. There is moderately elevated pulmonary artery systolic pressure.  3. The mitral valve is normal in structure. Mild mitral valve regurgitation. No evidence of mitral stenosis.  4. The aortic valve is calcified. There is moderate calcification of the aortic valve. Aortic valve regurgitation is not visualized. Mild aortic valve stenosis.  5. Abdominal aorta is normal size.  6. The inferior vena cava is normal in size with greater than 50% respiratory variability, suggesting right atrial pressure of 3 mmHg. FINDINGS  Left Ventricle: Left ventricular ejection fraction, by estimation, is 60 to 65%. The left ventricle has normal function. The left ventricle has no regional wall motion  abnormalities. The left ventricular internal cavity size was normal in size. There is  mild concentric left ventricular hypertrophy. Left ventricular diastolic parameters are consistent with Grade II diastolic dysfunction (pseudonormalization). Elevated left atrial pressure. Right Ventricle: The right ventricular size is normal. No increase in right ventricular wall thickness. Right ventricular systolic function is normal. There is moderately elevated pulmonary artery systolic pressure. The tricuspid regurgitant velocity is 3.61 m/s, and with an assumed right atrial pressure of 3 mmHg, the estimated right ventricular systolic pressure is 55.1 mmHg. Left Atrium: Left atrial size was normal in size. Right Atrium: Right atrial size was normal in size. Pericardium: Trivial pericardial effusion is present. The pericardial effusion is circumferential. Mitral Valve: The mitral valve is normal in structure. Mild mitral valve regurgitation. No evidence of mitral valve stenosis. MV peak gradient, 7.7 mmHg. The mean mitral valve gradient is 3.0 mmHg. Tricuspid Valve: The tricuspid valve is normal in structure. Tricuspid valve regurgitation is not demonstrated. No evidence of tricuspid stenosis. Aortic Valve: The aortic valve is calcified. There is moderate calcification of the aortic valve. There is moderate aortic valve annular calcification. Aortic valve regurgitation is not visualized. Mild aortic stenosis is present. Aortic valve mean gradient measures 9.3 mmHg. Aortic valve peak gradient measures 21.0 mmHg. Aortic valve area, by VTI measures 1.82 cm. Pulmonic Valve: The pulmonic valve was normal in structure. Pulmonic valve regurgitation is not visualized. No evidence of pulmonic stenosis. Aorta: Abdominal aorta is normal size. The aortic root is normal in size and structure. Venous: The inferior vena cava is normal in size with greater than 50% respiratory variability, suggesting right atrial pressure of 3 mmHg.  IAS/Shunts: No atrial level shunt detected by color flow Doppler.  LEFT VENTRICLE PLAX 2D LVIDd:         3.00 cm     Diastology LVIDs:         1.80 cm     LV e' medial:    5.22 cm/s LV PW:         1.10 cm     LV E/e' medial:  20.5 LV IVS:        1.20 cm     LV e' lateral:   7.51 cm/s LVOT diam:     1.80 cm     LV E/e' lateral: 14.2 LV SV:  76 LV SV Index:   43 LVOT Area:     2.54 cm  LV Volumes (MOD) LV vol d, MOD A2C: 72.1 ml LV vol d, MOD A4C: 73.2 ml LV vol s, MOD A2C: 16.4 ml LV vol s, MOD A4C: 21.0 ml LV SV MOD A2C:     55.7 ml LV SV MOD A4C:     73.2 ml LV SV MOD BP:      54.7 ml RIGHT VENTRICLE             IVC RV Basal diam:  3.10 cm     IVC diam: 1.60 cm RV S prime:     11.90 cm/s TAPSE (M-mode): 2.5 cm RVSP:           55.1 mmHg LEFT ATRIUM             Index        RIGHT ATRIUM           Index LA diam:        2.20 cm 1.24 cm/m   RA Pressure: 3.00 mmHg LA Vol (A2C):   70.7 ml 39.76 ml/m  RA Area:     13.10 cm LA Vol (A4C):   21.1 ml 11.87 ml/m  RA Volume:   24.90 ml  14.00 ml/m LA Biplane Vol: 39.2 ml 22.05 ml/m  AORTIC VALVE                     PULMONIC VALVE AV Area (Vmax):    1.84 cm      PV Vmax:       1.24 m/s AV Area (Vmean):   1.97 cm      PV Peak grad:  6.2 mmHg AV Area (VTI):     1.82 cm AV Vmax:           229.00 cm/s AV Vmean:          141.000 cm/s AV VTI:            0.417 m AV Peak Grad:      21.0 mmHg AV Mean Grad:      9.3 mmHg LVOT Vmax:         166.00 cm/s LVOT Vmean:        109.000 cm/s LVOT VTI:          0.299 m LVOT/AV VTI ratio: 0.72  AORTA Ao Root diam: 3.00 cm Ao Asc diam:  3.30 cm MITRAL VALVE                TRICUSPID VALVE MV Area (PHT): 4.24 cm     TR Peak grad:   52.1 mmHg MV Area VTI:   2.03 cm     TR Vmax:        361.00 cm/s MV Peak grad:  7.7 mmHg     Estimated RAP:  3.00 mmHg MV Mean grad:  3.0 mmHg     RVSP:           55.1 mmHg MV Vmax:       1.39 m/s MV Vmean:      73.1 cm/s    SHUNTS MV Decel Time: 179 msec     Systemic VTI:  0.30 m MR Peak grad: 94.5 mmHg      Systemic Diam: 1.80 cm MR Vmax:      486.00 cm/s MV E velocity: 107.00 cm/s MV A velocity: 106.00 cm/s MV E/A ratio:  1.01 Kardie Tobb DO Electronically signed by Kardie Tobb DO  Signature Date/Time: 08/27/2024/11:41:15 AM    Final    DG Chest Portable 1 View Result Date: 08/26/2024 EXAM: 1 VIEW(S) XRAY OF THE CHEST 08/26/2024 08:00:00 PM COMPARISON: CTA chest and portable chest both 08/22/2024. CLINICAL HISTORY: cough, hypoxia. Cough/hypoxia- Pt BIB ems for back pain, nausea, and SOB. Pt was diagnosed with a spinal fracture on Saturday, states they didn't do anything about it, she is still in pain 10/10. States she is having a hard time catching her breath ever since the accident. Hx of CHF, COPD, asthma, MI, HTN, right lung cancer, former smoker. FINDINGS: LUNGS AND PLEURA: There is central vascular prominence and mild interstitial edema in the lung bases with small pleural effusions. Stable appearance of hazy opacities in both lung bases and could indicate atelectasis, ground-glass edema, or pneumonitis. Remaining lungs are clear with mild emphysematous change. No pneumothorax. HEART AND MEDIASTINUM: Stable Cardiomegaly. There is a stable mediastinum with aortic tortuosity and atherosclerosis. BONES AND SOFT TISSUES: Osteopenia and asymmetric advanced arthrosis of the right shoulder. The recent study demonstrated a mild to moderate T7 anterior wedge compression fracture new from 03/16/2024 chest CT, but this is not well reassessed AP. IMPRESSION: 1. Stable cardiomegaly with central vascular prominence and mild interstitial edema in the lung bases with small pleural effusions. 2. Stable hazy bibasilar opacities that may represent atelectasis, edema, or pneumonitis. Overall aeration seems unchanged. 3. Mild to moderate T7 anterior wedge compression fracture, new from 03/16/2024 chest CT noted on recent CTA chest, not well reassessed AP. Electronically signed by: Francis Quam MD 08/26/2024 08:11 PM EDT RP  Workstation: [id number]   CT L-SPINE NO CHARGE Result Date: 08/22/2024 CLINICAL DATA:  Back pain EXAM: CT LUMBAR SPINE WITHOUT CONTRAST TECHNIQUE: Multidetector CT imaging of the lumbar spine was performed without intravenous contrast administration. Multiplanar CT image reconstructions were also generated. RADIATION DOSE REDUCTION: This exam was performed according to the departmental dose-optimization program which includes automated exposure control, adjustment of the mA and/or kV according to patient size and/or use of iterative reconstruction technique. COMPARISON:  CT abdomen and pelvis 08/19/2023 FINDINGS: Segmentation: 5 lumbar type vertebrae. Alignment: Slight degenerative anterolisthesis of L3 on L4 and L4 on L5, stable. Vertebrae: No acute fracture or focal pathologic process. Paraspinal and other soft tissues: Negative. Disc levels: Disc spaces maintained. Moderate degenerative facet disease, most pronounced in the lower lumbar spine. IMPRESSION: No acute bony abnormality. Electronically Signed   By: Franky Crease M.D.   On: 08/22/2024 19:05   CT T-SPINE NO CHARGE Result Date: 08/22/2024 CLINICAL DATA:  Back pain EXAM: CT THORACIC SPINE WITHOUT CONTRAST TECHNIQUE: Multidetector CT images of the thoracic were obtained using the standard protocol without intravenous contrast. RADIATION DOSE REDUCTION: This exam was performed according to the departmental dose-optimization program which includes automated exposure control, adjustment of the mA and/or kV according to patient size and/or use of iterative reconstruction technique. COMPARISON:  Rib CT chest 03/16/2024 FINDINGS: Alignment: No subluxation. Vertebrae: Chronic compression fractures status post vertebroplasty noted at T3 and T4. Mild compression fracture at T6, stable since prior study. Mild compression fracture at T7, new since prior study. Paraspinal and other soft tissues: Negative Disc levels: Degenerative disc disease diffusely, most  pronounced in the upper thoracic spine. IMPRESSION: New mild compression fracture at T7. Chronic compression fractures status post vertebroplasty at T3 and T4. Stable mild chronic compression fracture at T6. Electronically Signed   By: Franky Crease M.D.   On: 08/22/2024 19:03   CT ABDOMEN PELVIS W CONTRAST Result Date:  08/22/2024 CLINICAL DATA:  Abdominal pain, back pain, shortness of breath EXAM: CT ABDOMEN AND PELVIS WITH CONTRAST TECHNIQUE: Multidetector CT imaging of the abdomen and pelvis was performed using the standard protocol following bolus administration of intravenous contrast. RADIATION DOSE REDUCTION: This exam was performed according to the departmental dose-optimization program which includes automated exposure control, adjustment of the mA and/or kV according to patient size and/or use of iterative reconstruction technique. CONTRAST:  OMNIPAQUE  IOHEXOL  350 MG/ML SOLN COMPARISON:  None Available. FINDINGS: Lower chest: See chest CT report. Hepatobiliary: No focal hepatic abnormality. Gallbladder unremarkable. Pancreas: No focal abnormality or ductal dilatation. Spleen: No focal abnormality.  Normal size. Adrenals/Urinary Tract: Adrenal glands normal. Prior left nephrectomy. Scattered cysts within the right kidney are unchanged. No follow-up imaging recommended. No hydronephrosis. Urinary bladder unremarkable. Stomach/Bowel: Stomach, large and small bowel grossly unremarkable. No obstruction or inflammatory process. Vascular/Lymphatic: Diffuse aortoiliac atherosclerosis. No evidence of aneurysm or adenopathy. Reproductive: Uterus and adnexa unremarkable.  No mass. Other: No free fluid or free air. Musculoskeletal: No acute bony abnormality. IMPRESSION: No acute findings in the abdomen or pelvis. Diffuse aortoiliac atherosclerosis. Electronically Signed   By: Franky Crease M.D.   On: 08/22/2024 19:01   CT Angio Chest PE W and/or Wo Contrast Result Date: 08/22/2024 CLINICAL DATA:  Pulmonary  embolism (PE) suspected, high prob. Shortness of breath, back pain. EXAM: CT ANGIOGRAPHY CHEST WITH CONTRAST TECHNIQUE: Multidetector CT imaging of the chest was performed using the standard protocol during bolus administration of intravenous contrast. Multiplanar CT image reconstructions and MIPs were obtained to evaluate the vascular anatomy. RADIATION DOSE REDUCTION: This exam was performed according to the departmental dose-optimization program which includes automated exposure control, adjustment of the mA and/or kV according to patient size and/or use of iterative reconstruction technique. CONTRAST:  OMNIPAQUE  IOHEXOL  350 MG/ML SOLN COMPARISON:  03/16/2024 FINDINGS: Cardiovascular: Cardiomegaly. Scattered coronary artery and aortic atherosclerosis. No evidence of aortic aneurysm. No filling defects in the pulmonary arteries to suggest pulmonary emboli. Prominent central pulmonary arteries suggest pulmonary arterial hypertension. This is stable since prior study. Mediastinum/Nodes: No mediastinal, hilar, or axillary adenopathy. Trachea and esophagus are unremarkable. Thyroid  unremarkable. Lungs/Pleura: Trace bilateral pleural effusions. Mild centrilobular emphysema. Left base atelectasis or scarring, similar to prior study. No confluent airspace opacities otherwise. Upper Abdomen: No acute findings. Musculoskeletal: Chest wall soft tissues are unremarkable. Prior compression fractures and vertebroplasty changes in the upper thoracic spine, unchanged. Mild compression fracture at T6 is stable. Mild compression fracture at T7 is new since prior study. Review of the MIP images confirms the above findings. IMPRESSION: No evidence of pulmonary embolus. Cardiomegaly, coronary artery disease. Trace bilateral pleural effusions. New mild T7 compression fracture. Aortic Atherosclerosis (ICD10-I70.0) and Emphysema (ICD10-J43.9). Electronically Signed   By: Franky Crease M.D.   On: 08/22/2024 18:57   DG Chest Port 1  View Result Date: 08/22/2024 CLINICAL DATA:  Shortness of breath and back pain. EXAM: PORTABLE CHEST 1 VIEW COMPARISON:  03/16/2024, 05/10/2022. FINDINGS: The heart is enlarged and the mediastinal contour is within normal limits. There is atherosclerotic calcification of the aorta. Interstitial prominence and airspace disease is noted at the lung bases, unchanged from multiple prior exams and likely representing scarring. No definite effusion or pneumothorax is seen. Cervical spinal fusion hardware is present. No acute osseous abnormality. IMPRESSION: Stable chest with no active disease. Electronically Signed   By: Leita Birmingham M.D.   On: 08/22/2024 14:59     Subjective: No acute issues/events overnight  Discharge Exam: Vitals:   09/03/24 0426 09/03/24 0749  BP: (!) 169/76   Pulse: (!) 40   Resp:    Temp: 98.7 F (37.1 C)   SpO2: 94% 94%   Vitals:   09/02/24 1934 09/02/24 1942 09/03/24 0426 09/03/24 0749  BP: (!) 146/75  (!) 169/76   Pulse: 79  (!) 40   Resp:      Temp: 99 F (37.2 C)  98.7 F (37.1 C)   TempSrc: Oral  Oral   SpO2: 96% 96% 94% 94%  Weight:      Height:        General: Pt is alert, awake, not in acute distress Cardiovascular: RRR, S1/S2 +, no rubs, no gallops Respiratory: CTA bilaterally, no wheezing, no rhonchi Abdominal: Soft, NT, ND, bowel sounds + Extremities: no edema, no cyanosis    The results of significant diagnostics from this hospitalization (including imaging, microbiology, ancillary and laboratory) are listed below for reference.     Microbiology: Recent Results (from the past 240 hours)  Blood culture (routine x 2)     Status: None   Collection Time: 08/26/24 10:10 PM   Specimen: BLOOD RIGHT ARM  Result Value Ref Range Status   Specimen Description   Final    BLOOD RIGHT ARM Performed at [facility], [address], [place], [place] [address]    Special Requests   Final    Blood Culture results may not be optimal due to an  inadequate volume of blood received in culture bottles BOTTLES DRAWN AEROBIC AND ANAEROBIC Performed at [facility], [address] [place], [place] [address]    Culture   Final    NO GROWTH 5 DAYS Performed at [facility], [address] [place], [place] [address]    Report Status 09/01/2024 FINAL  Final  Blood culture (routine x 2)     Status: Abnormal   Collection Time: 08/26/24 10:15 PM   Specimen: BLOOD  Result Value Ref Range Status   Specimen Description   Final    BLOOD RIGHT ANTECUBITAL Performed at [facility], [address], [place], [place] [address]    Special Requests   Final    Blood Culture results may not be optimal due to an inadequate volume of blood received in culture bottles BOTTLES DRAWN AEROBIC AND ANAEROBIC Performed at [facility], [address] [place], [place] [address]    Culture  Setup Time   Final    GRAM POSITIVE RODS ANAEROBIC BOTTLE ONLY CRITICAL RESULT CALLED TO, READ BACK BY AND VERIFIED WITH: PHARMD M LILLISTON 09/01/2024 @ 0136 BY AB    Culture (A)  Final    CUTIBACTERIUM ACNES Standardized susceptibility testing for this organism is not available. Performed at [facility], [address], [place], [place] [address]    Report Status 09/03/2024 FINAL  Final  MRSA Next Gen by PCR, Nasal     Status: None   Collection Time: 08/29/24  8:20 AM   Specimen: Nasal Mucosa; Nasal Swab  Result Value Ref Range Status   MRSA by PCR Next Gen NOT DETECTED NOT DETECTED Final    Comment: (NOTE) The GeneXpert MRSA Assay (FDA approved for NASAL specimens only), is one component of a comprehensive MRSA colonization surveillance program. It is not intended to diagnose MRSA infection nor to guide or monitor treatment for MRSA infections. Test performance is not FDA approved in patients less than 2 years  old. Performed at [facility], [address], [place], [place] [address]      Labs: BNP (last 3 results) No results for input(s): BNP in the last 8760 hours. Basic Metabolic Panel: Recent Labs  Lab 08/30/24 0250 08/31/24 0239 09/01/24 0728 09/02/24 0310 09/03/24 0507  NA 134* 140 139 141 144  K 5.2* 5.2* 5.2* 4.7 4.3  CL 91* 98 97* 93* 93*  CO2 32 34* 36* 42* 44*  GLUCOSE 105* 139* 98 97 82  BUN 70* 61* 47* 40* 39*  CREATININE 1.84* 1.18* 0.89 0.95 1.13*  CALCIUM  9.2 10.0 9.7 10.2 9.9   Liver Function Tests: No results for input(s): AST, ALT, ALKPHOS, BILITOT, PROT, ALBUMIN  in the last 168 hours. No results for input(s): LIPASE, AMYLASE in the last 168 hours. No results for input(s): AMMONIA in the last 168 hours. CBC: Recent Labs  Lab 08/28/24 0412 08/30/24 0250 08/31/24 0239 09/01/24 0728 09/02/24 0310 09/03/24 0507  WBC 11.7* 8.9 8.1 11.7* 13.9* 10.4  NEUTROABS 10.6*  --   --   --   --   --   HGB 11.6* 11.5* 12.6 13.1 13.0 11.7*  HCT 38.1 35.3* 39.9 40.2 41.9 37.9  MCV 100.5* 97.8 95.7 95.7 98.1 98.2  PLT 237 252 295 263 327 278   Cardiac Enzymes: No results for input(s): CKTOTAL, CKMB, CKMBINDEX, TROPONINI in the last 168 hours. BNP: Invalid input(s): POCBNP CBG: Recent Labs  Lab 08/29/24 0756  GLUCAP 140*   D-Dimer No results for input(s): DDIMER in the last 72 hours. Hgb A1c No results for input(s): HGBA1C in the last 72 hours. Lipid Profile No results for input(s): CHOL, HDL, LDLCALC, TRIG, CHOLHDL, LDLDIRECT in the last 72 hours. Thyroid  function studies No results for input(s): TSH, T4TOTAL, T3FREE, THYROIDAB in the last 72 hours.  Invalid input(s): FREET3 Anemia work up No results for input(s): VITAMINB12, FOLATE, FERRITIN, TIBC, IRON, RETICCTPCT in the last 72 hours. Urinalysis    Component Value Date/Time   COLORURINE YELLOW 08/26/2024 2230   APPEARANCEUR HAZY (A) 08/26/2024 2230   LABSPEC 1.010 08/26/2024 2230    LABSPEC 1.010 10/18/2015 0959   PHURINE 5.0 08/26/2024 2230   GLUCOSEU NEGATIVE 08/26/2024 2230   GLUCOSEU Negative 10/18/2015 0959   HGBUR NEGATIVE 08/26/2024 2230   BILIRUBINUR NEGATIVE 08/26/2024 2230   BILIRUBINUR negative 08/07/2024 1135   BILIRUBINUR Negative 10/18/2015 0959   KETONESUR NEGATIVE 08/26/2024 2230   PROTEINUR NEGATIVE 08/26/2024 2230   UROBILINOGEN 0.2 08/07/2024 1135   UROBILINOGEN 0.2 10/18/2015 0959   NITRITE NEGATIVE 08/26/2024 2230   LEUKOCYTESUR MODERATE (A) 08/26/2024 2230   LEUKOCYTESUR Negative 10/18/2015 0959   Sepsis Labs Recent Labs  Lab 08/31/24 0239 09/01/24 0728 09/02/24 0310 09/03/24 0507  WBC 8.1 11.7* 13.9* 10.4   Microbiology Recent Results (from the past 240 hours)  Blood culture (routine x 2)     Status: None   Collection Time: 08/26/24 10:10 PM   Specimen: BLOOD RIGHT ARM  Result Value Ref Range Status   Specimen Description   Final    BLOOD RIGHT ARM Performed at [facility], [address], [place], [place] [address]    Special Requests   Final    Blood Culture results may not be optimal due to an inadequate volume of blood received in culture bottles BOTTLES DRAWN AEROBIC AND ANAEROBIC Performed at [facility], [address] [place], [place] [address]    Culture   Final    NO  GROWTH 5 DAYS Performed at [facility], [address], [place], [place] [address]    Report Status 09/01/2024 FINAL  Final  Blood culture (routine x 2)     Status: Abnormal   Collection Time: 08/26/24 10:15 PM   Specimen: BLOOD  Result Value Ref Range Status   Specimen Description   Final    BLOOD RIGHT ANTECUBITAL Performed at [facility], [address], [place], [place] [address]    Special Requests   Final    Blood Culture results may not be optimal due to an inadequate volume of blood received in culture bottles BOTTLES DRAWN AEROBIC AND ANAEROBIC Performed at [facility], [address], [place], [place] [address]    Culture  Setup Time   Final    GRAM POSITIVE RODS ANAEROBIC BOTTLE ONLY CRITICAL RESULT CALLED TO, READ BACK BY AND VERIFIED WITH: PHARMD M LILLISTON 09/01/2024 @ 0136 BY AB    Culture (A)  Final    CUTIBACTERIUM ACNES Standardized susceptibility testing for this organism is not available. Performed at [facility], [address], [place], [place] [address]    Report Status 09/03/2024 FINAL  Final  MRSA Next Gen by PCR, Nasal     Status: None   Collection Time: 08/29/24  8:20 AM   Specimen: Nasal Mucosa; Nasal Swab  Result Value Ref Range Status   MRSA by PCR Next Gen NOT DETECTED NOT DETECTED Final    Comment: (NOTE) The GeneXpert MRSA Assay (FDA approved for NASAL specimens only), is one component of a comprehensive MRSA colonization surveillance program. It is not intended to diagnose MRSA infection nor to guide or monitor treatment for MRSA infections. Test performance is not FDA approved in patients less than 44 years old. Performed at [facility], [address], [place], [place] [address]      Time coordinating discharge: Over 30 minutes  SIGNED:   Elsie JAYSON [place], DO [facility] 09/03/2024, 11:43 AM Pager   If 7PM-7AM, please contact night-coverage [email or web address]

## 2024-09-03 NOTE — Progress Notes (Signed)
 Physical Therapy Treatment Patient Details Name: GRACELYNN BIRCHER MRN: [id number] DOB: 1941/02/06 Today's Date: 09/03/2024   History of Present Illness Pt admitted from home 2* CHF exacerbation, acute on chronic respiratory failure, PNA, and hyponatremia. Pt s/p T7 vertebral body kyphoplasty 09/01/24. Pt with hx of CHF, COPD, MI, Lung CA s/p lobectomy, renal cell CA s/p nephrectomy, osteoporosis with spinal compression fxs - most recent at T7    PT Comments  Pt with significant improvement since last session. Pt pleasant, clear, denies pain, but reporting nausea and acid reflux discomfort. Educated pt on hand placement with transfers, able to complete multiple reps from recliner with CGA and RW. Pt amb 40 ft x3 with RW, min A to CGA with recliner follow, decreased cadence and increased time to navigate obstacles and complete turns, needing seated rest break between amb bouts due to fatigue. Pt on 2L O2, desat to 85% with HR up to 132, cued for pursed lip  breathing, improves to 94% and HR 96 with seated rest breaks- notified RN.   If plan is discharge home, recommend the following: A lot of help with walking and/or transfers;A little help with bathing/dressing/bathroom;Assistance with cooking/housework;Assist for transportation;Help with stairs or ramp for entrance   Can travel by private vehicle        Equipment Recommendations  None recommended by PT    Recommendations for Other Services       Precautions / Restrictions Precautions Precautions: Fall Restrictions Weight Bearing Restrictions Per Provider Order: No     Mobility  Bed Mobility               General bed mobility comments: in recliner upon arrival    Transfers Overall transfer level: Needs assistance Equipment used: Rolling walker (2 wheels) Transfers: Sit to/from Stand Sit to Stand: Contact guard assist           General transfer comment: cues for hand placement, CGA to power up to stand from recliner x4  reps    Ambulation/Gait Ambulation/Gait assistance: Contact guard assist, +2 safety/equipment, Min assist Gait Distance (Feet): 40 Feet (x3) Assistive device: Rolling walker (2 wheels) Gait Pattern/deviations: Step-through pattern, Decreased step length - right, Decreased step length - left, Decreased stride length Gait velocity: decreased     General Gait Details: slow, step through gait pattern, increased time and steps with turns, minimal bil foot clearance, decreased cadence with external distractions/obstacles, rqeuiring seated rest break between each amb bout, cues for pursed lip breathing as pt on 2L and desat to 85% and HR up to 132, improves to 94% and HR 96 with seated rest break   Stairs             Wheelchair Mobility     Tilt Bed    Modified Rankin (Stroke Patients Only)       Balance Overall balance assessment: Needs assistance         Standing balance support: Bilateral upper extremity supported, During functional activity, Reliant on assistive device for balance Standing balance-Leahy Scale: Poor                              Communication Communication Communication: No apparent difficulties  Cognition Arousal: Alert Behavior During Therapy: WFL for tasks assessed/performed   PT - Cognitive impairments: No apparent impairments                       PT - Cognition  Comments: pt pleasant, appropriate, reports hallucinating after medications one day this hospitalization but today feels clear Following commands: Intact      Cueing Cueing Techniques: Verbal cues, Gestural cues  Exercises      General Comments General comments (skin integrity, edema, etc.): 2L and desat to 85% and HR up to 132, improves to 94% and HR 96 with seated rest break within ~2 minutes - notified RN      Pertinent Vitals/Pain Pain Assessment Pain Assessment: Faces Faces Pain Scale: Hurts a little bit Pain Location: rubbing stomach stating I'm  nauseas Pain Descriptors / Indicators:  (nauseas and acid reflux) Pain Intervention(s): Limited activity within patient's tolerance, Monitored during session, Repositioned, Premedicated before session    Home Living                          Prior Function            PT Goals (current goals can now be found in the care plan section) Progress towards PT goals: Progressing toward goals    Frequency    Min 2X/week      PT Plan      Co-evaluation              AM-PAC PT 6 Clicks Mobility   Outcome Measure  Help needed turning from your back to your side while in a flat bed without using bedrails?: A Lot Help needed moving from lying on your back to sitting on the side of a flat bed without using bedrails?: A Lot Help needed moving to and from a bed to a chair (including a wheelchair)?: A Little Help needed standing up from a chair using your arms (e.g., wheelchair or bedside chair)?: A Little Help needed to walk in hospital room?: A Little Help needed climbing 3-5 steps with a railing? : A Lot 6 Click Score: 15    End of Session Equipment Utilized During Treatment: Gait belt;Oxygen  Activity Tolerance: Patient tolerated treatment well;Patient limited by fatigue Patient left: in chair;with call bell/phone within reach (pt in chair without chair alarm upon therapist's arrival and RN aware) Nurse Communication: Mobility status PT Visit Diagnosis: Difficulty in walking, not elsewhere classified (R26.2);Muscle weakness (generalized) (M62.81)     Time: 9145-9081 PT Time Calculation (min) (ACUTE ONLY): 24 min  Charges:    $Gait Training: 8-22 mins $Therapeutic Activity: 8-22 mins PT General Charges $$ ACUTE PT VISIT: 1 Visit                     Tori Gilbert Narain PT, DPT 09/03/24, 9:37 AM

## 2024-09-03 NOTE — TOC Transition Note (Addendum)
 Transition of Care [facility]) - Discharge Note   Patient Details  Name: Tammy Ball MRN: [id number] Date of Birth: [birth year]  Transition of Care [facility]) CM/SW Contact:  Bascom Service, RN Phone Number: 09/03/2024, 9:50 AM   Clinical Narrative: d/c to [facility] accepted. Await rm#,report # prior PTAR.  -12p-going to [employer] aware rm#[id number], report #[phone]. PTAR called No further CM needs.  -1:40p-facility needs d/c summary to reflect f/c or not. Awaiting patient to void.Patient has to sign herself in PTAR already backed up. PTAR cancelled.  -2:41p PTAR called. D/c with f/c-updated d/c summary sent.    Final next level of care: Skilled Nursing Facility Barriers to Discharge: No Barriers Identified   Patient Goals and CMS Choice Patient states their goals for this hospitalization and ongoing recovery are:: Rehab CMS Medicare.gov Compare Post Acute Care list provided to:: Patient Choice offered to / list presented to : Patient [place] ownership interest in [facility].provided to:: Patient    Discharge Placement              Patient chooses bed at: [facility] Patient to be transferred to facility by: PTAR Name of family member notified: Nikki(dtr) Patient and family notified of of transfer: 09/03/24  Discharge Plan and Services Additional resources added to the After Visit Summary for   In-house Referral: NA Discharge Planning Services: CM Consult Post Acute Care Choice: Skilled Nursing Facility          DME Arranged: N/A DME Agency: NA       HH Arranged: NA HH Agency: NA        Social Drivers of Health (SDOH) Interventions SDOH Screenings   Food Insecurity: No Food Insecurity (08/27/2024)  Housing: Low Risk  (08/27/2024)  Transportation Needs: No Transportation Needs (08/27/2024)  Recent Concern: Transportation Needs - Unmet Transportation Needs (06/02/2024)  Utilities: Not At Risk (08/27/2024)  Alcohol   Screen: Low Risk  (06/02/2024)  Depression (PHQ2-9): Medium Risk (08/25/2024)  Financial Resource Strain: Medium Risk (06/02/2024)  Physical Activity: Inactive (06/02/2024)  Social Connections: Socially Isolated (08/27/2024)  Stress: No Stress Concern Present (06/02/2024)  Tobacco Use: Medium Risk (08/26/2024)  Health Literacy: Adequate Health Literacy (06/02/2024)     Readmission Risk Interventions    08/31/2024    3:47 PM 06/11/2022   12:06 PM  Readmission Risk Prevention Plan  Transportation Screening Complete Complete  PCP or Specialist Appt within 3-5 Days Complete Complete  HRI or Home Care Consult Complete Complete  Social Work Consult for Recovery Care Planning/Counseling Complete Complete  Palliative Care Screening Not Applicable Not Applicable  Medication Review Oceanographer) Complete Complete

## 2024-09-03 NOTE — Plan of Care (Signed)
  Problem: Education: Goal: Knowledge of General Education information will improve Description: Including pain rating scale, medication(s)/side effects and non-pharmacologic comfort measures Outcome: Adequate for Discharge   Problem: Health Behavior/Discharge Planning: Goal: Ability to manage health-related needs will improve Outcome: Adequate for Discharge   Problem: Clinical Measurements: Goal: Ability to maintain clinical measurements within normal limits will improve Outcome: Adequate for Discharge Goal: Will remain free from infection Outcome: Adequate for Discharge Goal: Diagnostic test results will improve Outcome: Adequate for Discharge Goal: Respiratory complications will improve Outcome: Adequate for Discharge Goal: Cardiovascular complication will be avoided Outcome: Adequate for Discharge   Problem: Activity: Goal: Risk for activity intolerance will decrease Outcome: Adequate for Discharge   Problem: Nutrition: Goal: Adequate nutrition will be maintained Outcome: Adequate for Discharge   Problem: Coping: Goal: Level of anxiety will decrease Outcome: Adequate for Discharge   Problem: Elimination: Goal: Will not experience complications related to bowel motility Outcome: Adequate for Discharge Goal: Will not experience complications related to urinary retention Outcome: Adequate for Discharge   Problem: Pain Managment: Goal: General experience of comfort will improve and/or be controlled Outcome: Adequate for Discharge   Problem: Safety: Goal: Ability to remain free from injury will improve Outcome: Adequate for Discharge   Problem: Skin Integrity: Goal: Risk for impaired skin integrity will decrease Outcome: Adequate for Discharge  Ongoing care at rehab facility

## 2024-09-07 DIAGNOSIS — J9622 Acute and chronic respiratory failure with hypercapnia: Secondary | ICD-10-CM | POA: Diagnosis not present

## 2024-09-07 DIAGNOSIS — R5381 Other malaise: Secondary | ICD-10-CM | POA: Diagnosis not present

## 2024-09-07 DIAGNOSIS — N139 Obstructive and reflux uropathy, unspecified: Secondary | ICD-10-CM | POA: Diagnosis not present

## 2024-09-07 DIAGNOSIS — M6281 Muscle weakness (generalized): Secondary | ICD-10-CM | POA: Diagnosis not present

## 2024-09-07 DIAGNOSIS — J9621 Acute and chronic respiratory failure with hypoxia: Secondary | ICD-10-CM | POA: Diagnosis not present

## 2024-09-07 DIAGNOSIS — S22060D Wedge compression fracture of T7-T8 vertebra, subsequent encounter for fracture with routine healing: Secondary | ICD-10-CM | POA: Diagnosis not present

## 2024-09-07 DIAGNOSIS — J449 Chronic obstructive pulmonary disease, unspecified: Secondary | ICD-10-CM | POA: Diagnosis not present

## 2024-09-07 DIAGNOSIS — R2689 Other abnormalities of gait and mobility: Secondary | ICD-10-CM | POA: Diagnosis not present

## 2024-09-07 DIAGNOSIS — J9620 Acute and chronic respiratory failure, unspecified whether with hypoxia or hypercapnia: Secondary | ICD-10-CM | POA: Diagnosis not present

## 2024-09-07 DIAGNOSIS — J45909 Unspecified asthma, uncomplicated: Secondary | ICD-10-CM | POA: Diagnosis not present

## 2024-09-07 DIAGNOSIS — F419 Anxiety disorder, unspecified: Secondary | ICD-10-CM | POA: Diagnosis not present

## 2024-09-08 DIAGNOSIS — J9620 Acute and chronic respiratory failure, unspecified whether with hypoxia or hypercapnia: Secondary | ICD-10-CM | POA: Diagnosis not present

## 2024-09-08 DIAGNOSIS — S22060D Wedge compression fracture of T7-T8 vertebra, subsequent encounter for fracture with routine healing: Secondary | ICD-10-CM | POA: Diagnosis not present

## 2024-09-08 DIAGNOSIS — R5381 Other malaise: Secondary | ICD-10-CM | POA: Diagnosis not present

## 2024-09-08 DIAGNOSIS — N139 Obstructive and reflux uropathy, unspecified: Secondary | ICD-10-CM | POA: Diagnosis not present

## 2024-09-09 DIAGNOSIS — R5381 Other malaise: Secondary | ICD-10-CM | POA: Diagnosis not present

## 2024-09-09 DIAGNOSIS — J9620 Acute and chronic respiratory failure, unspecified whether with hypoxia or hypercapnia: Secondary | ICD-10-CM | POA: Diagnosis not present

## 2024-09-09 DIAGNOSIS — N139 Obstructive and reflux uropathy, unspecified: Secondary | ICD-10-CM | POA: Diagnosis not present

## 2024-09-09 DIAGNOSIS — S22060D Wedge compression fracture of T7-T8 vertebra, subsequent encounter for fracture with routine healing: Secondary | ICD-10-CM | POA: Diagnosis not present

## 2024-09-10 DIAGNOSIS — N1831 Chronic kidney disease, stage 3a: Secondary | ICD-10-CM | POA: Diagnosis not present

## 2024-09-10 DIAGNOSIS — I2510 Atherosclerotic heart disease of native coronary artery without angina pectoris: Secondary | ICD-10-CM | POA: Diagnosis not present

## 2024-09-10 DIAGNOSIS — J9622 Acute and chronic respiratory failure with hypercapnia: Secondary | ICD-10-CM | POA: Diagnosis not present

## 2024-09-10 DIAGNOSIS — M6281 Muscle weakness (generalized): Secondary | ICD-10-CM | POA: Diagnosis not present

## 2024-09-10 DIAGNOSIS — J9621 Acute and chronic respiratory failure with hypoxia: Secondary | ICD-10-CM | POA: Diagnosis not present

## 2024-09-10 DIAGNOSIS — S22060D Wedge compression fracture of T7-T8 vertebra, subsequent encounter for fracture with routine healing: Secondary | ICD-10-CM | POA: Diagnosis not present

## 2024-09-10 DIAGNOSIS — F419 Anxiety disorder, unspecified: Secondary | ICD-10-CM | POA: Diagnosis not present

## 2024-09-10 DIAGNOSIS — R2689 Other abnormalities of gait and mobility: Secondary | ICD-10-CM | POA: Diagnosis not present

## 2024-09-10 DIAGNOSIS — J45909 Unspecified asthma, uncomplicated: Secondary | ICD-10-CM | POA: Diagnosis not present

## 2024-09-10 DIAGNOSIS — J449 Chronic obstructive pulmonary disease, unspecified: Secondary | ICD-10-CM | POA: Diagnosis not present

## 2024-09-10 DIAGNOSIS — C3490 Malignant neoplasm of unspecified part of unspecified bronchus or lung: Secondary | ICD-10-CM | POA: Diagnosis not present

## 2024-09-10 DIAGNOSIS — I129 Hypertensive chronic kidney disease with stage 1 through stage 4 chronic kidney disease, or unspecified chronic kidney disease: Secondary | ICD-10-CM | POA: Diagnosis not present

## 2024-09-11 DIAGNOSIS — J9620 Acute and chronic respiratory failure, unspecified whether with hypoxia or hypercapnia: Secondary | ICD-10-CM | POA: Diagnosis not present

## 2024-09-11 DIAGNOSIS — N139 Obstructive and reflux uropathy, unspecified: Secondary | ICD-10-CM | POA: Diagnosis not present

## 2024-09-11 DIAGNOSIS — R5381 Other malaise: Secondary | ICD-10-CM | POA: Diagnosis not present

## 2024-09-11 DIAGNOSIS — S22060D Wedge compression fracture of T7-T8 vertebra, subsequent encounter for fracture with routine healing: Secondary | ICD-10-CM | POA: Diagnosis not present

## 2024-09-14 DIAGNOSIS — R5381 Other malaise: Secondary | ICD-10-CM | POA: Diagnosis not present

## 2024-09-14 DIAGNOSIS — J449 Chronic obstructive pulmonary disease, unspecified: Secondary | ICD-10-CM | POA: Diagnosis not present

## 2024-09-14 DIAGNOSIS — M6281 Muscle weakness (generalized): Secondary | ICD-10-CM | POA: Diagnosis not present

## 2024-09-14 DIAGNOSIS — J9620 Acute and chronic respiratory failure, unspecified whether with hypoxia or hypercapnia: Secondary | ICD-10-CM | POA: Diagnosis not present

## 2024-09-14 DIAGNOSIS — J9622 Acute and chronic respiratory failure with hypercapnia: Secondary | ICD-10-CM | POA: Diagnosis not present

## 2024-09-14 DIAGNOSIS — F419 Anxiety disorder, unspecified: Secondary | ICD-10-CM | POA: Diagnosis not present

## 2024-09-14 DIAGNOSIS — J45909 Unspecified asthma, uncomplicated: Secondary | ICD-10-CM | POA: Diagnosis not present

## 2024-09-14 DIAGNOSIS — K219 Gastro-esophageal reflux disease without esophagitis: Secondary | ICD-10-CM | POA: Diagnosis not present

## 2024-09-14 DIAGNOSIS — R2689 Other abnormalities of gait and mobility: Secondary | ICD-10-CM | POA: Diagnosis not present

## 2024-09-14 DIAGNOSIS — S22060D Wedge compression fracture of T7-T8 vertebra, subsequent encounter for fracture with routine healing: Secondary | ICD-10-CM | POA: Diagnosis not present

## 2024-09-14 DIAGNOSIS — N139 Obstructive and reflux uropathy, unspecified: Secondary | ICD-10-CM | POA: Diagnosis not present

## 2024-09-14 DIAGNOSIS — J9621 Acute and chronic respiratory failure with hypoxia: Secondary | ICD-10-CM | POA: Diagnosis not present

## 2024-09-15 ENCOUNTER — Inpatient Hospital Stay (HOSPITAL_COMMUNITY)

## 2024-09-15 ENCOUNTER — Emergency Department (HOSPITAL_COMMUNITY)

## 2024-09-15 ENCOUNTER — Inpatient Hospital Stay (HOSPITAL_COMMUNITY)
Admission: EM | Admit: 2024-09-15 | Discharge: 2024-10-12 | DRG: 871 | Disposition: E | Source: Skilled Nursing Facility | Attending: Pulmonary Disease | Admitting: Pulmonary Disease

## 2024-09-15 ENCOUNTER — Encounter (HOSPITAL_COMMUNITY): Payer: Self-pay

## 2024-09-15 ENCOUNTER — Other Ambulatory Visit: Payer: Self-pay

## 2024-09-15 DIAGNOSIS — J439 Emphysema, unspecified: Secondary | ICD-10-CM | POA: Diagnosis not present

## 2024-09-15 DIAGNOSIS — A419 Sepsis, unspecified organism: Secondary | ICD-10-CM | POA: Diagnosis present

## 2024-09-15 DIAGNOSIS — T82898A Other specified complication of vascular prosthetic devices, implants and grafts, initial encounter: Secondary | ICD-10-CM | POA: Diagnosis not present

## 2024-09-15 DIAGNOSIS — I495 Sick sinus syndrome: Secondary | ICD-10-CM | POA: Diagnosis present

## 2024-09-15 DIAGNOSIS — R109 Unspecified abdominal pain: Secondary | ICD-10-CM | POA: Diagnosis not present

## 2024-09-15 DIAGNOSIS — I82451 Acute embolism and thrombosis of right peroneal vein: Secondary | ICD-10-CM | POA: Diagnosis present

## 2024-09-15 DIAGNOSIS — J9601 Acute respiratory failure with hypoxia: Secondary | ICD-10-CM | POA: Diagnosis not present

## 2024-09-15 DIAGNOSIS — G9341 Metabolic encephalopathy: Secondary | ICD-10-CM | POA: Diagnosis not present

## 2024-09-15 DIAGNOSIS — J440 Chronic obstructive pulmonary disease with acute lower respiratory infection: Secondary | ICD-10-CM | POA: Diagnosis present

## 2024-09-15 DIAGNOSIS — A4154 Sepsis due to Acinetobacter baumannii: Secondary | ICD-10-CM | POA: Diagnosis present

## 2024-09-15 DIAGNOSIS — I2609 Other pulmonary embolism with acute cor pulmonale: Principal | ICD-10-CM | POA: Diagnosis present

## 2024-09-15 DIAGNOSIS — R6521 Severe sepsis with septic shock: Secondary | ICD-10-CM | POA: Diagnosis present

## 2024-09-15 DIAGNOSIS — N170 Acute kidney failure with tubular necrosis: Secondary | ICD-10-CM | POA: Diagnosis not present

## 2024-09-15 DIAGNOSIS — I517 Cardiomegaly: Secondary | ICD-10-CM | POA: Diagnosis not present

## 2024-09-15 DIAGNOSIS — Z4682 Encounter for fitting and adjustment of non-vascular catheter: Secondary | ICD-10-CM | POA: Diagnosis not present

## 2024-09-15 DIAGNOSIS — Z7901 Long term (current) use of anticoagulants: Secondary | ICD-10-CM | POA: Diagnosis not present

## 2024-09-15 DIAGNOSIS — E785 Hyperlipidemia, unspecified: Secondary | ICD-10-CM | POA: Diagnosis present

## 2024-09-15 DIAGNOSIS — J9620 Acute and chronic respiratory failure, unspecified whether with hypoxia or hypercapnia: Secondary | ICD-10-CM | POA: Diagnosis not present

## 2024-09-15 DIAGNOSIS — Z888 Allergy status to other drugs, medicaments and biological substances status: Secondary | ICD-10-CM

## 2024-09-15 DIAGNOSIS — Z881 Allergy status to other antibiotic agents status: Secondary | ICD-10-CM

## 2024-09-15 DIAGNOSIS — R238 Other skin changes: Secondary | ICD-10-CM | POA: Diagnosis not present

## 2024-09-15 DIAGNOSIS — I724 Aneurysm of artery of lower extremity: Secondary | ICD-10-CM | POA: Diagnosis not present

## 2024-09-15 DIAGNOSIS — Z8052 Family history of malignant neoplasm of bladder: Secondary | ICD-10-CM

## 2024-09-15 DIAGNOSIS — I2694 Multiple subsegmental pulmonary emboli without acute cor pulmonale: Secondary | ICD-10-CM | POA: Diagnosis not present

## 2024-09-15 DIAGNOSIS — R7989 Other specified abnormal findings of blood chemistry: Secondary | ICD-10-CM

## 2024-09-15 DIAGNOSIS — R042 Hemoptysis: Secondary | ICD-10-CM | POA: Diagnosis present

## 2024-09-15 DIAGNOSIS — Z66 Do not resuscitate: Secondary | ICD-10-CM | POA: Diagnosis present

## 2024-09-15 DIAGNOSIS — K219 Gastro-esophageal reflux disease without esophagitis: Secondary | ICD-10-CM | POA: Diagnosis present

## 2024-09-15 DIAGNOSIS — N1831 Chronic kidney disease, stage 3a: Secondary | ICD-10-CM | POA: Diagnosis present

## 2024-09-15 DIAGNOSIS — I2699 Other pulmonary embolism without acute cor pulmonale: Secondary | ICD-10-CM | POA: Diagnosis not present

## 2024-09-15 DIAGNOSIS — D649 Anemia, unspecified: Secondary | ICD-10-CM | POA: Diagnosis present

## 2024-09-15 DIAGNOSIS — I708 Atherosclerosis of other arteries: Secondary | ICD-10-CM | POA: Diagnosis not present

## 2024-09-15 DIAGNOSIS — Z905 Acquired absence of kidney: Secondary | ICD-10-CM

## 2024-09-15 DIAGNOSIS — J9621 Acute and chronic respiratory failure with hypoxia: Secondary | ICD-10-CM | POA: Diagnosis present

## 2024-09-15 DIAGNOSIS — Z85528 Personal history of other malignant neoplasm of kidney: Secondary | ICD-10-CM

## 2024-09-15 DIAGNOSIS — I272 Pulmonary hypertension, unspecified: Secondary | ICD-10-CM | POA: Diagnosis present

## 2024-09-15 DIAGNOSIS — Z515 Encounter for palliative care: Secondary | ICD-10-CM | POA: Diagnosis not present

## 2024-09-15 DIAGNOSIS — Z87891 Personal history of nicotine dependence: Secondary | ICD-10-CM

## 2024-09-15 DIAGNOSIS — J189 Pneumonia, unspecified organism: Secondary | ICD-10-CM | POA: Diagnosis present

## 2024-09-15 DIAGNOSIS — D6959 Other secondary thrombocytopenia: Secondary | ICD-10-CM | POA: Diagnosis present

## 2024-09-15 DIAGNOSIS — I130 Hypertensive heart and chronic kidney disease with heart failure and stage 1 through stage 4 chronic kidney disease, or unspecified chronic kidney disease: Secondary | ICD-10-CM | POA: Diagnosis present

## 2024-09-15 DIAGNOSIS — Z923 Personal history of irradiation: Secondary | ICD-10-CM

## 2024-09-15 DIAGNOSIS — R918 Other nonspecific abnormal finding of lung field: Secondary | ICD-10-CM | POA: Diagnosis not present

## 2024-09-15 DIAGNOSIS — F32A Depression, unspecified: Secondary | ICD-10-CM | POA: Diagnosis present

## 2024-09-15 DIAGNOSIS — J441 Chronic obstructive pulmonary disease with (acute) exacerbation: Secondary | ICD-10-CM | POA: Diagnosis present

## 2024-09-15 DIAGNOSIS — I10 Essential (primary) hypertension: Secondary | ICD-10-CM | POA: Diagnosis not present

## 2024-09-15 DIAGNOSIS — I5033 Acute on chronic diastolic (congestive) heart failure: Secondary | ICD-10-CM | POA: Diagnosis present

## 2024-09-15 DIAGNOSIS — I4891 Unspecified atrial fibrillation: Secondary | ICD-10-CM | POA: Diagnosis present

## 2024-09-15 DIAGNOSIS — I700 Atherosclerosis of aorta: Secondary | ICD-10-CM | POA: Diagnosis not present

## 2024-09-15 DIAGNOSIS — Y832 Surgical operation with anastomosis, bypass or graft as the cause of abnormal reaction of the patient, or of later complication, without mention of misadventure at the time of the procedure: Secondary | ICD-10-CM | POA: Diagnosis not present

## 2024-09-15 DIAGNOSIS — N179 Acute kidney failure, unspecified: Secondary | ICD-10-CM | POA: Diagnosis not present

## 2024-09-15 DIAGNOSIS — Z902 Acquired absence of lung [part of]: Secondary | ICD-10-CM

## 2024-09-15 DIAGNOSIS — R0602 Shortness of breath: Secondary | ICD-10-CM | POA: Diagnosis not present

## 2024-09-15 DIAGNOSIS — Z85118 Personal history of other malignant neoplasm of bronchus and lung: Secondary | ICD-10-CM

## 2024-09-15 DIAGNOSIS — Z59868 Other specified financial insecurity: Secondary | ICD-10-CM

## 2024-09-15 DIAGNOSIS — I2510 Atherosclerotic heart disease of native coronary artery without angina pectoris: Secondary | ICD-10-CM | POA: Diagnosis present

## 2024-09-15 DIAGNOSIS — Z8249 Family history of ischemic heart disease and other diseases of the circulatory system: Secondary | ICD-10-CM

## 2024-09-15 DIAGNOSIS — J9622 Acute and chronic respiratory failure with hypercapnia: Secondary | ICD-10-CM | POA: Diagnosis present

## 2024-09-15 DIAGNOSIS — Z91030 Bee allergy status: Secondary | ICD-10-CM

## 2024-09-15 DIAGNOSIS — R578 Other shock: Secondary | ICD-10-CM | POA: Diagnosis not present

## 2024-09-15 DIAGNOSIS — R079 Chest pain, unspecified: Secondary | ICD-10-CM | POA: Diagnosis not present

## 2024-09-15 DIAGNOSIS — J90 Pleural effusion, not elsewhere classified: Secondary | ICD-10-CM | POA: Diagnosis not present

## 2024-09-15 DIAGNOSIS — Z7982 Long term (current) use of aspirin: Secondary | ICD-10-CM

## 2024-09-15 DIAGNOSIS — D689 Coagulation defect, unspecified: Secondary | ICD-10-CM | POA: Diagnosis present

## 2024-09-15 DIAGNOSIS — J449 Chronic obstructive pulmonary disease, unspecified: Secondary | ICD-10-CM | POA: Diagnosis not present

## 2024-09-15 DIAGNOSIS — E872 Acidosis, unspecified: Secondary | ICD-10-CM | POA: Diagnosis present

## 2024-09-15 DIAGNOSIS — B9683 Acinetobacter baumannii as the cause of diseases classified elsewhere: Secondary | ICD-10-CM | POA: Diagnosis not present

## 2024-09-15 DIAGNOSIS — Z452 Encounter for adjustment and management of vascular access device: Secondary | ICD-10-CM | POA: Diagnosis not present

## 2024-09-15 DIAGNOSIS — Z803 Family history of malignant neoplasm of breast: Secondary | ICD-10-CM

## 2024-09-15 DIAGNOSIS — Z79899 Other long term (current) drug therapy: Secondary | ICD-10-CM

## 2024-09-15 DIAGNOSIS — R5381 Other malaise: Secondary | ICD-10-CM | POA: Diagnosis present

## 2024-09-15 DIAGNOSIS — R059 Cough, unspecified: Secondary | ICD-10-CM | POA: Diagnosis not present

## 2024-09-15 DIAGNOSIS — D696 Thrombocytopenia, unspecified: Secondary | ICD-10-CM | POA: Diagnosis not present

## 2024-09-15 DIAGNOSIS — J9602 Acute respiratory failure with hypercapnia: Secondary | ICD-10-CM | POA: Diagnosis not present

## 2024-09-15 DIAGNOSIS — T82838A Hemorrhage of vascular prosthetic devices, implants and grafts, initial encounter: Secondary | ICD-10-CM | POA: Diagnosis not present

## 2024-09-15 DIAGNOSIS — I252 Old myocardial infarction: Secondary | ICD-10-CM

## 2024-09-15 DIAGNOSIS — F40240 Claustrophobia: Secondary | ICD-10-CM | POA: Diagnosis present

## 2024-09-15 LAB — URINALYSIS, W/ REFLEX TO CULTURE (INFECTION SUSPECTED)
Bilirubin Urine: NEGATIVE
Glucose, UA: NEGATIVE mg/dL
Hgb urine dipstick: NEGATIVE
Ketones, ur: NEGATIVE mg/dL
Leukocytes,Ua: NEGATIVE
Nitrite: NEGATIVE
Protein, ur: 30 mg/dL — AB
Specific Gravity, Urine: 1.029 (ref 1.005–1.030)
pH: 5 (ref 5.0–8.0)

## 2024-09-15 LAB — BASIC METABOLIC PANEL WITH GFR
Anion gap: 10 (ref 5–15)
BUN: 33 mg/dL — ABNORMAL HIGH (ref 8–23)
CO2: 38 mmol/L — ABNORMAL HIGH (ref 22–32)
Calcium: 10.1 mg/dL (ref 8.9–10.3)
Chloride: 90 mmol/L — ABNORMAL LOW (ref 98–111)
Creatinine, Ser: 1.35 mg/dL — ABNORMAL HIGH (ref 0.44–1.00)
GFR, Estimated: 40 mL/min — ABNORMAL LOW (ref >60)
Glucose, Bld: 101 mg/dL — ABNORMAL HIGH (ref 70–99)
Potassium: 4.3 mmol/L (ref 3.5–5.1)
Sodium: 138 mmol/L (ref 135–145)

## 2024-09-15 LAB — RESPIRATORY PANEL BY PCR

## 2024-09-15 LAB — BLOOD GAS, ARTERIAL
Acid-Base Excess: 7.4 mmol/L — ABNORMAL HIGH (ref 0.0–2.0)
Acid-Base Excess: 8.8 mmol/L — ABNORMAL HIGH (ref 0.0–2.0)
Bicarbonate: 34.7 mmol/L — ABNORMAL HIGH (ref 20.0–28.0)
Bicarbonate: 32.7 mmol/L — ABNORMAL HIGH (ref 20.0–28.0)
Drawn by: 56037
Drawn by: 56037
FIO2: 100 %
FIO2: 100 %
MECHVT: 410 mL
MECHVT: 410 mL
O2 Saturation: 91.2 %
O2 Saturation: 99.9 %
PEEP: 10 cmH2O
PEEP: 12 cmH2O
Patient temperature: 37
Patient temperature: 36.7
RATE: 20 {breaths}/min
RATE: 20 {breaths}/min
pCO2 arterial: 60 mmHg — ABNORMAL HIGH (ref 32–48)
pCO2 arterial: 41 mmHg (ref 32–48)
pH, Arterial: 7.37 (ref 7.35–7.45)
pH, Arterial: 7.51 — ABNORMAL HIGH (ref 7.35–7.45)
pO2, Arterial: 57 mmHg — ABNORMAL LOW (ref 83–108)
pO2, Arterial: 88 mmHg (ref 83–108)

## 2024-09-15 LAB — CBC
HCT: 40.4 % (ref 36.0–46.0)
Hemoglobin: 12.5 g/dL (ref 12.0–15.0)
MCH: 30.6 pg (ref 26.0–34.0)
MCHC: 30.9 g/dL (ref 30.0–36.0)
MCV: 98.8 fL (ref 80.0–100.0)
Platelets: 197 K/uL (ref 150–400)
RBC: 4.09 MIL/uL (ref 3.87–5.11)
RDW: 16.1 % — ABNORMAL HIGH (ref 11.5–15.5)
WBC: 10 K/uL (ref 4.0–10.5)
nRBC: 0 % (ref 0.0–0.2)

## 2024-09-15 LAB — GLUCOSE, CAPILLARY: Glucose-Capillary: 110 mg/dL — ABNORMAL HIGH (ref 70–99)

## 2024-09-15 LAB — I-STAT CG4 LACTIC ACID, ED
Lactic Acid, Venous: 1.9 mmol/L (ref 0.5–1.9)
Lactic Acid, Venous: 11.1 mmol/L (ref 0.5–1.9)

## 2024-09-15 LAB — D-DIMER, QUANTITATIVE: D-Dimer, Quant: 11.66 ug{FEU}/mL — ABNORMAL HIGH (ref 0.00–0.50)

## 2024-09-15 LAB — TROPONIN T, HIGH SENSITIVITY
Troponin T High Sensitivity: 93 ng/L — ABNORMAL HIGH (ref 0–19)
Troponin T High Sensitivity: 83 ng/L — ABNORMAL HIGH (ref 0–19)

## 2024-09-15 LAB — ECHOCARDIOGRAM COMPLETE
Area-P 1/2: 4.71 cm2
Height: 63 in
S' Lateral: 1.1 cm
Weight: 2432 [oz_av]

## 2024-09-15 LAB — PROTIME-INR
INR: 0.9 (ref 0.8–1.2)
Prothrombin Time: 12.8 s (ref 11.4–15.2)

## 2024-09-15 LAB — RESP PANEL BY RT-PCR (RSV, FLU A&B, COVID)  RVPGX2
Influenza A by PCR: NEGATIVE
Influenza B by PCR: NEGATIVE
Resp Syncytial Virus by PCR: NEGATIVE
SARS Coronavirus 2 by RT PCR: NEGATIVE

## 2024-09-15 LAB — PRO BRAIN NATRIURETIC PEPTIDE: Pro Brain Natriuretic Peptide: 866 pg/mL — ABNORMAL HIGH (ref <300.0)

## 2024-09-15 LAB — LACTIC ACID, PLASMA: Lactic Acid, Venous: 1.3 mmol/L (ref 0.5–1.9)

## 2024-09-15 MED ORDER — FENTANYL CITRATE (PF) 50 MCG/ML IJ SOSY
PREFILLED_SYRINGE | INTRAMUSCULAR | Status: AC
  Administered 2024-09-15: 50 ug
  Filled 2024-09-15: qty 2

## 2024-09-15 MED ORDER — LACTATED RINGERS IV BOLUS (SEPSIS)
1000.0000 mL | Freq: Once | INTRAVENOUS | Status: AC
  Administered 2024-09-15: 1000 mL via INTRAVENOUS

## 2024-09-15 MED ORDER — FENTANYL BOLUS VIA INFUSION: 25.0000 ug | INTRAVENOUS | Status: DC | PRN

## 2024-09-15 MED ORDER — SODIUM CHLORIDE 0.9 % IV SOLN
250.0000 mL | INTRAVENOUS | Status: AC
  Administered 2024-09-15: 250 mL via INTRAVENOUS

## 2024-09-15 MED ORDER — ALBUTEROL SULFATE (2.5 MG/3ML) 0.083% IN NEBU: 2.5000 mg | INHALATION_SOLUTION | RESPIRATORY_TRACT | Status: DC | PRN

## 2024-09-15 MED ORDER — POLYETHYLENE GLYCOL 3350 17 G PO PACK: 17.0000 g | PACK | Freq: Every day | ORAL | Status: DC | PRN

## 2024-09-15 MED ORDER — ROCURONIUM BROMIDE 10 MG/ML (PF) SYRINGE
PREFILLED_SYRINGE | INTRAVENOUS | Status: AC
  Administered 2024-09-15: 70 mg
  Filled 2024-09-15: qty 10

## 2024-09-15 MED ORDER — LIDOCAINE 5 % EX PTCH
1.0000 | MEDICATED_PATCH | CUTANEOUS | Status: DC
  Administered 2024-09-16 – 2024-09-20 (×5): 1 via TRANSDERMAL
  Filled 2024-09-15 (×6): qty 1

## 2024-09-15 MED ORDER — LACTATED RINGERS IV BOLUS (SEPSIS)
500.0000 mL | Freq: Once | INTRAVENOUS | Status: AC
  Administered 2024-09-15: 500 mL via INTRAVENOUS

## 2024-09-15 MED ORDER — LACTATED RINGERS IV SOLN: INTRAVENOUS | Status: DC

## 2024-09-15 MED ORDER — SODIUM CHLORIDE 0.9 % IV SOLN
500.0000 mg | INTRAVENOUS | Status: DC
  Filled 2024-09-15: qty 5

## 2024-09-15 MED ORDER — VANCOMYCIN HCL IN DEXTROSE 1-5 GM/200ML-% IV SOLN
1000.0000 mg | INTRAVENOUS | Status: DC
  Administered 2024-09-17: 1000 mg via INTRAVENOUS
  Filled 2024-09-15: qty 200

## 2024-09-15 MED ORDER — SODIUM CHLORIDE 0.9 % IV SOLN
2.0000 g | Freq: Once | INTRAVENOUS | Status: AC
  Administered 2024-09-15: 2 g via INTRAVENOUS
  Filled 2024-09-15: qty 20

## 2024-09-15 MED ORDER — HEPARIN BOLUS VIA INFUSION
2000.0000 [IU] | Freq: Once | INTRAVENOUS | Status: AC
Start: 2024-09-15 — End: 2024-09-15
  Administered 2024-09-15: 2000 [IU] via INTRAVENOUS
  Filled 2024-09-15: qty 2000

## 2024-09-15 MED ORDER — ACETAMINOPHEN 325 MG PO TABS: 650.0000 mg | ORAL_TABLET | ORAL | Status: DC | PRN

## 2024-09-15 MED ORDER — HEPARIN (PORCINE) 25000 UT/250ML-% IV SOLN
1000.0000 [IU]/h | INTRAVENOUS | Status: DC
  Administered 2024-09-15: 1000 [IU]/h via INTRAVENOUS
  Filled 2024-09-15: qty 250

## 2024-09-15 MED ORDER — SODIUM CHLORIDE 0.9 % IV SOLN
500.0000 mg | Freq: Once | INTRAVENOUS | Status: AC
  Administered 2024-09-15: 500 mg via INTRAVENOUS
  Filled 2024-09-15: qty 5

## 2024-09-15 MED ORDER — DOCUSATE SODIUM 100 MG PO CAPS: 100.0000 mg | ORAL_CAPSULE | Freq: Two times a day (BID) | ORAL | Status: DC | PRN

## 2024-09-15 MED ORDER — POLYVINYL ALCOHOL 1.4 % OP SOLN
1.0000 [drp] | Freq: Every day | OPHTHALMIC | Status: DC | PRN
  Administered 2024-09-19 – 2024-09-22 (×2): 1 [drp] via OPHTHALMIC
  Filled 2024-09-15: qty 15

## 2024-09-15 MED ORDER — INSULIN ASPART 100 UNIT/ML IJ SOLN
0.0000 [IU] | INTRAMUSCULAR | Status: DC
  Administered 2024-09-16 (×2): 1 [IU] via SUBCUTANEOUS
  Administered 2024-09-16: 2 [IU] via SUBCUTANEOUS
  Administered 2024-09-16 – 2024-09-17 (×3): 1 [IU] via SUBCUTANEOUS
  Administered 2024-09-17: 2 [IU] via SUBCUTANEOUS
  Administered 2024-09-17: 1 [IU] via SUBCUTANEOUS
  Administered 2024-09-17: 2 [IU] via SUBCUTANEOUS
  Administered 2024-09-18 – 2024-09-23 (×6): 1 [IU] via SUBCUTANEOUS
  Filled 2024-09-15 (×3): qty 1
  Filled 2024-09-15: qty 2
  Filled 2024-09-15 (×5): qty 1
  Filled 2024-09-15: qty 2
  Filled 2024-09-15: qty 1

## 2024-09-15 MED ORDER — ARFORMOTEROL TARTRATE 15 MCG/2ML IN NEBU
15.0000 ug | INHALATION_SOLUTION | Freq: Two times a day (BID) | RESPIRATORY_TRACT | Status: DC
  Administered 2024-09-15: 15 ug via RESPIRATORY_TRACT
  Filled 2024-09-15: qty 2

## 2024-09-15 MED ORDER — SODIUM BICARBONATE 8.4 % IV SOLN
INTRAVENOUS | Status: AC
  Filled 2024-09-15: qty 100

## 2024-09-15 MED ORDER — AZITHROMYCIN 500 MG IV SOLR: 500.0000 mg | INTRAVENOUS | Status: DC

## 2024-09-15 MED ORDER — ETOMIDATE 2 MG/ML IV SOLN
INTRAVENOUS | Status: AC
  Administered 2024-09-15: 10 mg
  Filled 2024-09-15: qty 20

## 2024-09-15 MED ORDER — PANTOPRAZOLE SODIUM 40 MG PO TBEC
40.0000 mg | DELAYED_RELEASE_TABLET | Freq: Every day | ORAL | Status: DC
  Administered 2024-09-15: 40 mg via ORAL
  Filled 2024-09-15: qty 1

## 2024-09-15 MED ORDER — KETAMINE HCL 50 MG/5ML IJ SOSY
PREFILLED_SYRINGE | INTRAMUSCULAR | Status: AC
  Filled 2024-09-15: qty 10

## 2024-09-15 MED ORDER — REVEFENACIN 175 MCG/3ML IN SOLN
175.0000 ug | Freq: Every day | RESPIRATORY_TRACT | Status: DC
  Administered 2024-09-15 – 2024-09-23 (×8): 175 ug via RESPIRATORY_TRACT
  Filled 2024-09-15 (×8): qty 3

## 2024-09-15 MED ORDER — FAMOTIDINE 20 MG PO TABS
20.0000 mg | ORAL_TABLET | Freq: Two times a day (BID) | ORAL | Status: DC
  Administered 2024-09-16 (×2): 20 mg
  Filled 2024-09-15 (×2): qty 1

## 2024-09-15 MED ORDER — VASOPRESSIN 20 UNITS/100 ML INFUSION FOR SHOCK
0.0400 [IU]/min | INTRAVENOUS | Status: DC
  Administered 2024-09-16 – 2024-09-18 (×5): 0.04 [IU]/min via INTRAVENOUS
  Filled 2024-09-15 (×5): qty 100

## 2024-09-15 MED ORDER — BUDESONIDE 0.5 MG/2ML IN SUSP
0.5000 mg | Freq: Two times a day (BID) | RESPIRATORY_TRACT | Status: DC
  Administered 2024-09-16 – 2024-09-23 (×13): 0.5 mg via RESPIRATORY_TRACT
  Filled 2024-09-15 (×13): qty 2

## 2024-09-15 MED ORDER — CHLORHEXIDINE GLUCONATE CLOTH 2 % EX PADS
6.0000 | MEDICATED_PAD | Freq: Every day | CUTANEOUS | Status: DC
  Administered 2024-09-15 – 2024-09-22 (×8): 6 via TOPICAL

## 2024-09-15 MED ORDER — PHENYLEPHRINE 80 MCG/ML (10ML) SYRINGE FOR IV PUSH (FOR BLOOD PRESSURE SUPPORT)
PREFILLED_SYRINGE | INTRAVENOUS | Status: AC
  Filled 2024-09-15: qty 10

## 2024-09-15 MED ORDER — ATORVASTATIN CALCIUM 40 MG PO TABS
40.0000 mg | ORAL_TABLET | Freq: Every day | ORAL | Status: DC
  Administered 2024-09-15: 40 mg via ORAL
  Filled 2024-09-15: qty 1

## 2024-09-15 MED ORDER — NOREPINEPHRINE 16 MG/250ML-% IV SOLN: 0.0000 ug/min | INTRAVENOUS | Status: DC

## 2024-09-15 MED ORDER — IOHEXOL 350 MG/ML SOLN
60.0000 mL | Freq: Once | INTRAVENOUS | Status: AC | PRN
  Administered 2024-09-15: 60 mL via INTRAVENOUS

## 2024-09-15 MED ORDER — MIDAZOLAM HCL (PF) 2 MG/2ML IJ SOLN
1.0000 mg | INTRAMUSCULAR | Status: DC | PRN
  Administered 2024-09-16 – 2024-09-17 (×4): 1 mg via INTRAVENOUS
  Filled 2024-09-15 (×4): qty 2

## 2024-09-15 MED ORDER — FENTANYL CITRATE (PF) 50 MCG/ML IJ SOSY: 25.0000 ug | PREFILLED_SYRINGE | Freq: Once | INTRAMUSCULAR | Status: DC

## 2024-09-15 MED ORDER — VASOPRESSIN 20 UNITS/100 ML INFUSION FOR SHOCK
INTRAVENOUS | Status: AC
  Filled 2024-09-15: qty 100

## 2024-09-15 MED ORDER — DOCUSATE SODIUM 50 MG/5ML PO LIQD
100.0000 mg | Freq: Two times a day (BID) | ORAL | Status: DC
  Administered 2024-09-16 – 2024-09-18 (×5): 100 mg
  Filled 2024-09-15 (×5): qty 10

## 2024-09-15 MED ORDER — FENTANYL 2500MCG IN NS 250ML (10MCG/ML) PREMIX INFUSION
0.0000 ug/h | INTRAVENOUS | Status: DC
  Administered 2024-09-15: 25 ug/h via INTRAVENOUS
  Administered 2024-09-17: 250 ug/h via INTRAVENOUS
  Administered 2024-09-17: 50 ug/h via INTRAVENOUS
  Filled 2024-09-15 (×2): qty 250

## 2024-09-15 MED ORDER — NOREPINEPHRINE 4 MG/250ML-% IV SOLN
0.0000 ug/min | INTRAVENOUS | Status: DC
  Administered 2024-09-15: 5 ug/min via INTRAVENOUS
  Administered 2024-09-15: 2 ug/min via INTRAVENOUS
  Filled 2024-09-15: qty 250

## 2024-09-15 MED ORDER — HYDROCORTISONE SOD SUC (PF) 100 MG IJ SOLR
100.0000 mg | Freq: Three times a day (TID) | INTRAMUSCULAR | Status: DC
  Administered 2024-09-15 – 2024-09-19 (×11): 100 mg via INTRAVENOUS
  Filled 2024-09-15 (×11): qty 2

## 2024-09-15 MED ORDER — STERILE WATER FOR INJECTION IJ SOLN
5.0000 ng/kg/min | INTRAVENOUS | Status: DC
  Administered 2024-09-15 – 2024-09-17 (×5): 50 ng/kg/min via RESPIRATORY_TRACT
  Filled 2024-09-15 (×7): qty 5

## 2024-09-15 MED ORDER — TRAMADOL HCL 50 MG PO TABS: 50.0000 mg | ORAL_TABLET | Freq: Four times a day (QID) | ORAL | Status: DC | PRN

## 2024-09-15 MED ORDER — POLYETHYLENE GLYCOL 3350 17 G PO PACK
17.0000 g | PACK | Freq: Every day | ORAL | Status: DC
  Administered 2024-09-16 – 2024-09-18 (×3): 17 g
  Filled 2024-09-15 (×3): qty 1

## 2024-09-15 MED ORDER — SODIUM BICARBONATE 8.4 % IV SOLN
100.0000 meq | Freq: Once | INTRAVENOUS | Status: DC
Start: 2024-09-15 — End: 2024-09-16

## 2024-09-15 MED ORDER — NOREPINEPHRINE 4 MG/250ML-% IV SOLN
0.0000 ug/min | INTRAVENOUS | Status: DC
  Administered 2024-09-15: 20 ug/min via INTRAVENOUS
  Administered 2024-09-16: 29 ug/min via INTRAVENOUS
  Administered 2024-09-16: 16 ug/min via INTRAVENOUS
  Administered 2024-09-16: 23 ug/min via INTRAVENOUS
  Administered 2024-09-16: 20 ug/min via INTRAVENOUS
  Administered 2024-09-16: 18 ug/min via INTRAVENOUS
  Filled 2024-09-15 (×6): qty 250

## 2024-09-15 MED ORDER — CYCLOSPORINE 0.05 % OP EMUL
1.0000 [drp] | Freq: Two times a day (BID) | OPHTHALMIC | Status: DC
  Administered 2024-09-15 – 2024-09-23 (×16): 1 [drp] via OPHTHALMIC
  Filled 2024-09-15 (×20): qty 30

## 2024-09-15 MED ORDER — SODIUM CHLORIDE 0.9 % IV SOLN
2.0000 g | Freq: Two times a day (BID) | INTRAVENOUS | Status: DC
  Administered 2024-09-15 (×2): 2 g via INTRAVENOUS
  Filled 2024-09-15 (×2): qty 12.5

## 2024-09-15 MED ORDER — VANCOMYCIN HCL 1500 MG/300ML IV SOLN
1500.0000 mg | Freq: Once | INTRAVENOUS | Status: AC
  Administered 2024-09-15: 1500 mg via INTRAVENOUS
  Filled 2024-09-15: qty 300

## 2024-09-15 NOTE — Progress Notes (Signed)
 RT placed pt on 100% NRB and 15 LPM HF for low sats. Pt being transferred to ICU. MD and RN aware.

## 2024-09-15 NOTE — Procedures (Signed)
 Intubation Procedure Note  Tammy Ball  [id number]  [birth year]  Date:09/15/24  Time:7:09 PM   Provider Performing:Michaeljames Milnes A Akoni Parton    Procedure: Intubation (31500)  Indication(s) Respiratory Failure  Consent Risks of the procedure as well as the alternatives and risks of each were explained to the patient and/or caregiver.  Consent for the procedure was obtained and is signed in the bedside chart   Anesthesia Etomidate, Fentanyl , and Rocuronium   10 mg etomidate, 50 of fentanyl , 70 rocuronium  Time Out Verified patient identification, verified procedure, site/side was marked, verified correct patient position, special equipment/implants available, medications/allergies/relevant history reviewed, required imaging and test results available.   Sterile Technique Usual hand hygeine, masks, and gloves were used   Procedure Description Patient positioned in bed supine.  Sedation given as noted above.  Patient was intubated with endotracheal tube using Glidescope.  View was Grade 1 full glottis .  Number of attempts was 1.  Colorimetric CO2 detector was consistent with tracheal placement.   Complications/Tolerance None; patient tolerated the procedure well. Chest X-ray is ordered to verify placement.   EBL Minimal   Specimen(s) None

## 2024-09-15 NOTE — ED Notes (Signed)
 US  PIV placed L AC 20g 1.88

## 2024-09-15 NOTE — Progress Notes (Signed)
 PHARMACY - ANTICOAGULATION CONSULT NOTE  Pharmacy Consult for heparin   Indication: pulmonary embolus  Allergies  Allergen Reactions   Bee Venom Anaphylaxis, Shortness Of Breath, Swelling and Other (See Comments)    Swelling at site (reaction to bees and wasps)   Amlodipine  Swelling and Other (See Comments)    Swelling of the ankles and hands    Levofloxacin Other (See Comments)    Joint pain    Alendronate  Other (See Comments)    Joint pains / hypercalcemia    Hctz [Hydrochlorothiazide ] Palpitations and Other (See Comments)    Sweating     Patient Measurements: Height: 5' 3 (160 cm) Weight: 68.9 kg (152 lb) IBW/kg (Calculated) : 52.4 HEPARIN  DW (KG): 66.5  Vital Signs: Temp: 99.3 F (37.4 C) (11/04 0957) Temp Source: Oral (11/04 0957) BP: 89/65 (11/04 1145) Pulse Rate: 83 (11/04 1154)  Labs: Recent Labs    09/15/24 1008  HGB 12.5  HCT 40.4  PLT 197  LABPROT 12.8  INR 0.9  CREATININE 1.35*    Estimated Creatinine Clearance: 31 mL/min (A) (by C-G formula based on SCr of 1.35 mg/dL (H)).   Medical History: Past Medical History:  Diagnosis Date   Anemia    was while doing chemo   Anxiety    Arthritis    Asthma    Chemotherapy-induced neuropathy 11/01/2015   CHF (congestive heart failure) (HCC)    Claustrophobia    COPD (chronic obstructive pulmonary disease) (HCC)    Depression    Encounter for antineoplastic chemotherapy 02/09/2016   GERD (gastroesophageal reflux disease)    Headache    prior to menopause   Heart murmur    History of echocardiogram    Echo 2/17: EF 60-65%, grade 1 diastolic dysfunction, mild MR, trivial pericardial effusion   History of nuclear stress test    Myoview 2/17: no ischemia or scar, EF 79%; low risk   History of radiation therapy 10/30/16-11/06/16   Hyperlipidemia    Hypertension    Insomnia 05/16/2016   Myocardial infarction (HCC)    Non-small cell carcinoma of right lung, stage 2 (HCC) 10/03/2015   Oxygen   deficiency    Pneumonia    Radiation 02/29/16-04/10/16   50.4 Gy to right central chest   Renal cell carcinoma (HCC)    L nephrectomy  in 2012    Assessment: 83 yo F with new PE on CTA: multiple bilateral PEs with evidence of R heart strain.  No anticoagulants PTA CBC WNL SCr 1.35  Goal of Therapy:  Heparin  level 0.3-0.7 units/ml Monitor platelets by anticoagulation protocol: Yes   Plan:  Heparin  2000 unit bolus followed by heparin  drip @ 1000 units/hr Check 8 hour heparin  level Daily CBC & heparin  level   Rosaline IVAR Edison, Pharm.D Use secure chat for questions 09/15/2024 12:55 PM

## 2024-09-15 NOTE — Progress Notes (Signed)
 Echocardiogram 2D Echocardiogram has been performed.  Tinnie FORBES Gosling RDCS 09/15/2024, 2:23 PM

## 2024-09-15 NOTE — H&P (Signed)
 NAME:  Tammy Ball, MRN:  [id number], DOB:  09-Sep-1941, LOS: 0 ADMISSION DATE:  09/15/2024, CONSULTATION DATE:  11/4 REFERRING MD:  Dr. Randol, CHIEF COMPLAINT:  septic shock; pna; PE   History of Present Illness:  Patient is a 83 yo F w/ pertinent PMH lung cancer s/p lobectomy and cehmo, reneal cell carcinoma w/ left nephrectomy, diastolic chf, COPD on 2L [place] at home, thoracic spinal fracture involving T7, htn, hld, cad presents to [facility] on 11/4 w/ left sided chest pain.  Patient admitted last month for back. Imaging noted T7 compression spinal fracture. While in hospital patient developed worsening respiratory failure thought 2/2 to hypoventilation/atelectasis from severe pain requiring bipap. Completed abx for possible pna. IR performed kyphoplasty on 10/21 w/ improvement. Discharged to SNF for ongoing therapy.  On 11/3 patient developed cough w/ blood tinged sputum. On 11/4 patient woke up w/ acute left sided chest/back pain w/ sob. States she felt like she had fever and bodyaches. O2 had to be increased from 2 to 4L at facility. Brought to [facility] ED. On arrival patient temp 99.3 F and wbc 10. LA 1.9. BS w/ rhonchi/rales. BP soft. CXR w/ increased left basilar opacities and small left pleural effusion. Cultures obtained, given fluids, and started on rocephin /azithro. UA pending. D-dimer elevated 11.66 and trop 93. CTA chest showing multiple b/l segmental PE w/ evidence of R heart strain; RLL pleural based opacification; moderate consolidation over LLL w/ small left pleural effusion. Heparin  started. Despite iv fluids patient remained hypotensive requiring levo. Pccm consulted.  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   CHF (congestive heart failure) (HCC)    Claustrophobia    COPD (chronic obstructive pulmonary disease) (HCC)    Depression    Encounter  for antineoplastic chemotherapy 02/09/2016   GERD (gastroesophageal reflux disease)    Headache    prior to menopause   Heart murmur    History of echocardiogram    Echo 2/17: EF 60-65%, grade 1 diastolic dysfunction, mild MR, trivial pericardial effusion   History of nuclear stress test    Myoview 2/17: no ischemia or scar, EF 79%; low risk   History of radiation therapy 10/30/16-11/06/16   Hyperlipidemia    Hypertension    Insomnia 05/16/2016   Myocardial infarction (HCC)    Non-small cell carcinoma of right lung, stage 2 (HCC) 10/03/2015   Oxygen  deficiency    Pneumonia    Radiation 02/29/16-04/10/16   50.4 Gy to right central chest   Renal cell carcinoma (HCC)    L nephrectomy  in 2012     Significant Hospital Events: Including procedures, antibiotic start and stop dates in addition to other pertinent events   11/4 admit w/ pna and pe on pressors  Interim History / Subjective:  See above  Objective    Blood pressure (!) 89/65, pulse 83, temperature 99.3 F (37.4 C), temperature source Oral, resp. rate (!) 23, height 5' 3 (1.6 m), weight 68.9 kg, SpO2 93%.        Intake/Output Summary (Last 24 hours) at 09/15/2024 1339 Last data filed at 09/15/2024 1327 Gross per 24 hour  Intake 2850 ml  Output --  Net 2850 ml   Filed Weights   09/15/24 1102  Weight: 68.9 kg    Examination: General: ill appearing elderly female in  NAD HEENT: MM pink/moist; Boomer in place Neuro: Aox3; MAE CV: s1s2, RRR, no m/r/g PULM:  rales/rhonchi BS bilaterally; [place] 6L upper 80s GI: soft, bsx4 active  Extremities: warm/dry, no edema    Resolved problem list   Assessment and Plan   Shock: presumed septic 2/2 to pna; also w/ PE; was noted to be hypotensive on last admission Plan: -levo for map goal >65 -cefepime /vanc for hcap coverage; add azithro for atypical -follow bcx2, ua, urine legionella/strep, rvp, exp sputum, mrsa pcr -trend LA, wbc/fever curve -heparin  for PE -echo  B/l  PE Plan: -heparin  -echo and dvt us  -f/u bnp and trop -consider IR consult  Acute on chronic respiratory failure w/ hypoxia: on 2L [place] at home Possible pna Hemoptysis: likely 2/2 to pna and pe Hx of COPD Plan: -wean  for sats >92% -abx as above -follow cultures -consider txa nebs if hemoptysis worsens -resume home triple therapy as nebs; switch back to inhalers as sob improves -pulm toiletry  Elevated creat Plan: -Trend BMP / urinary output -Replace electrolytes as indicated -Avoid nephrotoxic agents, ensure adequate renal perfusion  Recent T7 compression fracture s/p balloon kyphoplasty  Plan: -pain management -pt/ot  Hx of diastolic chf HTN HLD CAD Plan: -hold diuretics; daily weights; strict I/o's -echo pending -resume statin -hold asa for now w/ hemoptysis -hold anti-hypertensive's w/ shock  Hx of lung cancer s/p RUL lobectomy Hx of renal cell carcinoma s/p left nephrectomy Plan: -f/u outpt   Labs   CBC: Recent Labs  Lab 09/15/24 1008  WBC 10.0  HGB 12.5  HCT 40.4  MCV 98.8  PLT 197    Basic Metabolic Panel: Recent Labs  Lab 09/15/24 1008  NA 138  K 4.3  CL 90*  CO2 38*  GLUCOSE 101*  BUN 33*  CREATININE 1.35*  CALCIUM  10.1   GFR: Estimated Creatinine Clearance: 31 mL/min (A) (by C-G formula based on SCr of 1.35 mg/dL (H)). Recent Labs  Lab 09/15/24 1007 09/15/24 1008 09/15/24 1253  WBC  --  10.0  --   LATICACIDVEN 1.9  --  11.1*    Liver Function Tests: No results for input(s): AST, ALT, ALKPHOS, BILITOT, PROT, ALBUMIN  in the last 168 hours. No results for input(s): LIPASE, AMYLASE in the last 168 hours. No results for input(s): AMMONIA in the last 168 hours.  ABG    Component Value Date/Time   PHART 7.4 08/30/2024 0506   PCO2ART 54 (H) 08/30/2024 0506   PO2ART 103 08/30/2024 0506   HCO3 33.4 (H) 08/30/2024 0506   TCO2 31 01/19/2016 0424   ACIDBASEDEF 1.0 01/18/2016 1120   O2SAT 97.4 08/30/2024  0506     Coagulation Profile: Recent Labs  Lab 09/15/24 1008  INR 0.9    Cardiac Enzymes: No results for input(s): CKTOTAL, CKMB, CKMBINDEX, TROPONINI in the last 168 hours.  HbA1C: No results found for: HGBA1C  CBG: No results for input(s): GLUCAP in the last 168 hours.  Review of Systems:   Review of Systems  Constitutional:  Positive for malaise/fatigue.  Respiratory:  Positive for cough, hemoptysis, sputum production and shortness of breath.   Cardiovascular:  Positive for chest pain.  Gastrointestinal:  Negative for abdominal pain, nausea and vomiting.     Past Medical History:  She,  has a past medical history of Anemia, Anxiety, Arthritis, Asthma, Chemotherapy-induced neuropathy (11/01/2015), CHF (congestive heart failure) (HCC), Claustrophobia, COPD (chronic obstructive pulmonary disease) (HCC), Depression, Encounter for antineoplastic chemotherapy (02/09/2016), GERD (gastroesophageal reflux disease), Headache, Heart murmur, History  of echocardiogram, History of nuclear stress test, History of radiation therapy (10/30/16-11/06/16), Hyperlipidemia, Hypertension, Insomnia (05/16/2016), Myocardial infarction [facility]), Non-small cell carcinoma of right lung, stage 2 (HCC) (10/03/2015), Oxygen  deficiency, Pneumonia, Radiation (02/29/16-04/10/16), and Renal cell carcinoma (HCC).   Surgical History:   Past Surgical History:  Procedure Laterality Date   BACK SURGERY     cervical 1991   BIOPSY  11/15/2022   Procedure: BIOPSY;  Surgeon: San Sandor GAILS, DO;  Location: [facility];  Service: Gastroenterology;;   COLONOSCOPY WITH PROPOFOL  N/A 11/15/2022   Procedure: COLONOSCOPY WITH PROPOFOL ;  Surgeon: San Sandor GAILS, DO;  Location: [facility];  Service: Gastroenterology;  Laterality: N/A;   ESOPHAGOGASTRODUODENOSCOPY (EGD) WITH PROPOFOL  N/A 11/15/2022   Procedure: ESOPHAGOGASTRODUODENOSCOPY (EGD) WITH PROPOFOL ;  Surgeon: San Sandor GAILS, DO;  Location: [facility];  Service: Gastroenterology;  Laterality: N/A;   EYE SURGERY     IR GENERIC HISTORICAL  01/16/2017   IR RADIOLOGIST EVAL & MGMT 01/16/2017 [facility]   IR GENERIC HISTORICAL  01/31/2017   IR FLUORO GUIDED NEEDLE PLC ASPIRATION/INJECTION LOC 01/31/2017 [name], MD [facility]   IR GENERIC HISTORICAL  01/31/2017   IR VERTEBROPLASTY CERV/THOR BX INC UNI/BIL INC/INJECT/IMAGING 01/31/2017 [name], MD [facility]   IR GENERIC HISTORICAL  01/31/2017   IR VERTEBROPLASTY EA ADDL (T&LS) BX INC UNI/BIL INC INJECT/IMAGING 01/31/2017 [name], MD [facility]   IR KYPHO THORACIC WITH BONE BIOPSY  09/01/2024   kidney cancer     LOBECTOMY Right    RUL   NEPHRECTOMY  2012   POLYPECTOMY  11/15/2022   Procedure: POLYPECTOMY;  Surgeon: San Sandor GAILS, DO;  Location: [facility];  Service: Gastroenterology;;   SPINE SURGERY     TUBAL LIGATION     VIDEO ASSISTED THORACOSCOPY (VATS)/WEDGE RESECTION Right 01/18/2016   Procedure: VIDEO ASSISTED THORACOSCOPY (VATS)/LUNG RESECTION, THOROCOTOMY, RIGHT UPPER LOBECTOMY, LYMPH NODE DISSECTION, PLACEMENT OF ON Q;  Surgeon: [name], MD;  Location: [facility] OR;  Service: Thoracic;  Laterality: Right;   VIDEO BRONCHOSCOPY Bilateral 09/20/2015   Procedure: VIDEO BRONCHOSCOPY WITHOUT FLUORO;  Surgeon: [name], MD;  Location: [facility];  Service: Cardiopulmonary;  Laterality: Bilateral;   VIDEO BRONCHOSCOPY N/A 01/18/2016   Procedure: VIDEO BRONCHOSCOPY;  Surgeon: [name], MD;  Location: [facility] OR;  Service: Thoracic;  Laterality: N/A;     Social History:   reports that she quit smoking about 13 years ago. Her smoking use included cigarettes. She started smoking about 63 years ago. She has a 50 pack-year smoking history. She has never used smokeless tobacco. She reports that she does not drink alcohol  and does not use drugs.   Family History:  Her family history includes Birth defects in her sister; Breast  cancer in her sister; CAD (age of onset: 46) in her mother; Cancer in her maternal grandmother and mother; Glaucoma in her daughter; Heart attack (age of onset: 24) in her daughter; Heart disease in her sister; Hypertension in her mother; Obesity in her brother. There is no history of Colon cancer, Colon polyps, Esophageal cancer, Rectal cancer, or Stomach cancer.   Allergies Allergies  Allergen Reactions   Bee Venom Anaphylaxis, Shortness Of Breath, Swelling and Other (See Comments)    Swelling at site (reaction to bees and wasps)   Amlodipine  Swelling and Other (See Comments)    Swelling of the ankles and hands    Levofloxacin Other (See Comments)    Joint pain    Alendronate  Other (See Comments)  Joint pains / hypercalcemia    Hctz [Hydrochlorothiazide ] Palpitations and Other (See Comments)    Sweating      Home Medications  Prior to Admission medications   Medication Sig Start Date End Date Taking? Authorizing Provider  acetaminophen  (TYLENOL ) 500 MG tablet Take 500 mg by mouth every 6 (six) hours as needed for headache or moderate pain (pain).    [provider]  albuterol  (PROVENTIL ) (2.5 MG/3ML) 0.083% nebulizer solution INHALE 3 ML BY NEBULIZATION EVERY 6 HOURS AS NEEDED FOR WHEEZING OR SHORTNESS OF BREATH 04/30/24   [name], MD  albuterol  (VENTOLIN  HFA) 108 (90 Base) MCG/ACT inhaler Inhale 1-2 puffs into the lungs every 6 (six) hours as needed for wheezing or shortness of breath. 04/29/24   [name], MD  Ascorbic Acid  (VITAMIN C) 1000 MG tablet Take 1,000 mg by mouth every morning.    [provider]  aspirin  EC 81 MG tablet Take 81 mg by mouth every morning.    [provider]  atorvastatin  (LIPITOR) 40 MG tablet TAKE 1 TABLET BY MOUTH EVERY DAY 02/02/24   [name], Miguel Jose, MD  budesonide -glycopyrrolate -formoterol  (BREZTRI  AEROSPHERE) 160-9-4.8 MCG/ACT AERO inhaler Inhale 2 puffs into the lungs in the morning and at bedtime. 04/29/24    Byrum, Robert S, MD  Calcium  Carbonate Antacid (TUMS PO) Take 1 tablet by mouth 4 (four) times daily as needed (heartburn).    [provider]  Carboxymethylcellulose Sodium (ARTIFICIAL TEARS OP) Place 1 drop into both eyes daily as needed (dry eyes).    [provider]  carvedilol  (COREG ) 25 MG tablet Take 1 tablet (25 mg total) by mouth 2 (two) times daily. 04/15/24   Emelia [name], NP  cholecalciferol  (VITAMIN D3) 25 MCG (1000 UNIT) tablet Take 1,000 Units by mouth daily.    [provider]  cycloSPORINE (RESTASIS) 0.05 % ophthalmic emulsion Place 1 drop into both eyes See admin instructions. Every 2 hours 08/02/24   [provider]  furosemide  (LASIX ) 20 MG tablet Take 1 tablet (20 mg total) by mouth daily. 09/03/24   Lue Elsie BROCKS, MD  Guaifenesin  (MUCINEX  MAXIMUM STRENGTH) 1200 MG TB12 Take 1,200 mg by mouth every morning.    [provider]  loperamide  (IMODIUM ) 2 MG capsule Take 1 capsule (2 mg total) by mouth 4 (four) times daily as needed for diarrhea or loose stools. 08/19/23   [name], [name], MD  losartan  (COZAAR ) 100 MG tablet TAKE 1 TABLET BY MOUTH EVERY DAY 02/17/24   Purcell [name], MD  Omega-3 Fatty Acids  (FISH OIL) 1200 MG CAPS Take 1,200 mg by mouth every morning.    [provider]  ondansetron  (ZOFRAN -ODT) 4 MG disintegrating tablet 4mg  ODT q4 hours prn nausea/vomit 08/22/24   Zammit, Joseph, MD  OXYGEN  Inhale 2 L into the lungs as needed (to keep SATS at or above 90).    [provider]  polyethylene glycol (MIRALAX  / GLYCOLAX ) 17 g packet Take 17 g by mouth 2 (two) times daily. 09/03/24   Lue Elsie BROCKS, MD  predniSONE  (DELTASONE ) 10 MG tablet Take 4 tablets (40 mg total) by mouth daily for 5 days, THEN 2 tablets (20 mg total) daily for 5 days, THEN 1 tablet (10 mg total) daily for 5 days. 09/03/24 09/18/24  Lue Elsie BROCKS, MD  senna-docusate (SENOKOT-S) 8.6-50 MG tablet Take 1 tablet by mouth  2 (two) times daily. 09/03/24   Lue Elsie BROCKS, MD  sodium chloride  (OCEAN) 0.65 % SOLN nasal  spray Place 1 spray into both nostrils at bedtime as needed for congestion.    [provider]  traMADol  (ULTRAM ) 50 MG tablet Take 1 tablet (50 mg total) by mouth every 6 (six) hours as needed. 09/03/24   Lue Elsie BROCKS, MD  traZODone  (DESYREL ) 50 MG tablet Take 0.5-1 tablets (25-50 mg total) by mouth at bedtime as needed for sleep. 09/03/24   Lue Elsie BROCKS, MD     Critical care time: 45 minutes    JD Emilio RIGGERS [facility] 09/15/2024, 1:39 PM  Please see [email or web address] for pager details.  From 7A-7P if no response, please call [phone]. After hours, please call ELink [phone].

## 2024-09-15 NOTE — Progress Notes (Signed)
 Pt was placed on HHF [place] at 55L/100% and continue to desat. Pt was then placed on BIPAP at 20/12 100% and failed BIPAP. Pt intubated with no complications due to not responding to any other O2 devices.

## 2024-09-15 NOTE — ED Provider Notes (Signed)
 [place] EMERGENCY DEPARTMENT AT [facility] Provider Note   CSN: [id number] Arrival date & time: 09/15/24  9064     Patient presents with: Shortness of Breath   Tammy Ball is a 83 y.o. female.    Shortness of Breath    Patient has a history of COPD hypertension and renal cell cancer chronic respiratory failure, chronic kidney disease, CHF.  Patient presents ER for evaluation of fever, chest pain, difficulty with breathing, shortness of breath.  Patient states she started symptoms this morning.  Patient states she is having bodyaches.  She is also been coughing bringing up sputum.  It hurts for her to breathe.  She having pain in her chest.  She was feeling feverish.  She is normally on 2 L nasal cannula oxygen  but they have had to increase her oxygen  to 4 L today at the nursing facility.  Patient's not having abdominal pain.  No rashes no leg swelling  Prior to Admission medications   Medication Sig Start Date End Date Taking? Authorizing Provider  acetaminophen  (TYLENOL ) 500 MG tablet Take 500 mg by mouth every 6 (six) hours as needed for headache or moderate pain (pain).    [provider]  albuterol  (PROVENTIL ) (2.5 MG/3ML) 0.083% nebulizer solution INHALE 3 ML BY NEBULIZATION EVERY 6 HOURS AS NEEDED FOR WHEEZING OR SHORTNESS OF BREATH 04/30/24   Shelah Lamar GORMAN, MD  albuterol  (VENTOLIN  HFA) 108 (90 Base) MCG/ACT inhaler Inhale 1-2 puffs into the lungs every 6 (six) hours as needed for wheezing or shortness of breath. 04/29/24   Shelah Lamar GORMAN, MD  Ascorbic Acid  (VITAMIN C) 1000 MG tablet Take 1,000 mg by mouth every morning.    [provider]  aspirin  EC 81 MG tablet Take 81 mg by mouth every morning.    [provider]  atorvastatin  (LIPITOR) 40 MG tablet TAKE 1 TABLET BY MOUTH EVERY DAY 02/02/24   [name], Miguel Jose, MD  budesonide -glycopyrrolate -formoterol  (BREZTRI  AEROSPHERE) 160-9-4.8 MCG/ACT AERO inhaler Inhale 2 puffs into the lungs in  the morning and at bedtime. 04/29/24   Byrum, Robert S, MD  Calcium  Carbonate Antacid (TUMS PO) Take 1 tablet by mouth 4 (four) times daily as needed (heartburn).    [provider]  Carboxymethylcellulose Sodium (ARTIFICIAL TEARS OP) Place 1 drop into both eyes daily as needed (dry eyes).    [provider]  carvedilol  (COREG ) 25 MG tablet Take 1 tablet (25 mg total) by mouth 2 (two) times daily. 04/15/24   Emelia [name], NP  cholecalciferol  (VITAMIN D3) 25 MCG (1000 UNIT) tablet Take 1,000 Units by mouth daily.    [provider]  cycloSPORINE (RESTASIS) 0.05 % ophthalmic emulsion Place 1 drop into both eyes See admin instructions. Every 2 hours 08/02/24   [provider]  furosemide  (LASIX ) 20 MG tablet Take 1 tablet (20 mg total) by mouth daily. 09/03/24   Lue Elsie BROCKS, MD  Guaifenesin  (MUCINEX  MAXIMUM STRENGTH) 1200 MG TB12 Take 1,200 mg by mouth every morning.    [provider]  loperamide  (IMODIUM ) 2 MG capsule Take 1 capsule (2 mg total) by mouth 4 (four) times daily as needed for diarrhea or loose stools. 08/19/23   [name], [name], MD  losartan  (COZAAR ) 100 MG tablet TAKE 1 TABLET BY MOUTH EVERY DAY 02/17/24   Purcell [name], MD  Omega-3 Fatty Acids  (FISH OIL) 1200 MG CAPS Take 1,200 mg by mouth every morning.    [provider]  ondansetron  (ZOFRAN -ODT)  4 MG disintegrating tablet 4mg  ODT q4 hours prn nausea/vomit 08/22/24   Zammit, Joseph, MD  OXYGEN  Inhale 2 L into the lungs as needed (to keep SATS at or above 90).    [provider]  polyethylene glycol (MIRALAX  / GLYCOLAX ) 17 g packet Take 17 g by mouth 2 (two) times daily. 09/03/24   Lue Elsie BROCKS, MD  predniSONE  (DELTASONE ) 10 MG tablet Take 4 tablets (40 mg total) by mouth daily for 5 days, THEN 2 tablets (20 mg total) daily for 5 days, THEN 1 tablet (10 mg total) daily for 5 days. 09/03/24 09/18/24  Lue Elsie BROCKS, MD  senna-docusate (SENOKOT-S)  8.6-50 MG tablet Take 1 tablet by mouth 2 (two) times daily. 09/03/24   Lue Elsie BROCKS, MD  sodium chloride  (OCEAN) 0.65 % SOLN nasal spray Place 1 spray into both nostrils at bedtime as needed for congestion.    [provider]  traMADol  (ULTRAM ) 50 MG tablet Take 1 tablet (50 mg total) by mouth every 6 (six) hours as needed. 09/03/24   Lue Elsie BROCKS, MD  traZODone  (DESYREL ) 50 MG tablet Take 0.5-1 tablets (25-50 mg total) by mouth at bedtime as needed for sleep. 09/03/24   Lue Elsie BROCKS, MD    Allergies: Bee venom, Amlodipine , Levofloxacin, Alendronate , and Hctz [hydrochlorothiazide ]    Review of Systems  Respiratory:  Positive for shortness of breath.     Updated Vital Signs BP (!) 89/65   Pulse 83   Temp 99.3 F (37.4 C) (Oral)   Resp (!) 23   Ht 1.6 m (5' 3)   Wt 68.9 kg   SpO2 93%   BMI 26.93 kg/m   Physical Exam Vitals and nursing note reviewed.  Constitutional:      Appearance: She is ill-appearing.  HENT:     Head: Normocephalic and atraumatic.     Right Ear: External ear normal.     Left Ear: External ear normal.  Eyes:     General: No scleral icterus.       Right eye: No discharge.        Left eye: No discharge.     Conjunctiva/sclera: Conjunctivae normal.  Neck:     Trachea: No tracheal deviation.  Cardiovascular:     Rate and Rhythm: Regular rhythm. Tachycardia present.  Pulmonary:     Effort: Pulmonary effort is normal. No respiratory distress.     Breath sounds: No stridor. Rhonchi and rales present. No wheezing.  Abdominal:     General: Bowel sounds are normal. There is no distension.     Palpations: Abdomen is soft.     Tenderness: There is no abdominal tenderness. There is no guarding or rebound.  Musculoskeletal:        General: No tenderness or deformity.     Cervical back: Neck supple.  Skin:    General: Skin is warm and dry.     Findings: No rash.  Neurological:     General: No focal deficit present.      Mental Status: She is alert.     Cranial Nerves: No cranial nerve deficit, dysarthria or facial asymmetry.     Sensory: No sensory deficit.     Motor: No abnormal muscle tone or seizure activity.     Coordination: Coordination normal.  Psychiatric:        Mood and Affect: Mood normal.     (all labs ordered are listed, but only abnormal results are displayed) Labs Reviewed  BASIC METABOLIC PANEL  WITH GFR - Abnormal; Notable for the following components:      Result Value   Chloride 90 (*)    CO2 38 (*)    Glucose, Bld 101 (*)    BUN 33 (*)    Creatinine, Ser 1.35 (*)    GFR, Estimated 40 (*)    All other components within normal limits  CBC - Abnormal; Notable for the following components:   RDW 16.1 (*)    All other components within normal limits  D-DIMER, QUANTITATIVE - Abnormal; Notable for the following components:   D-Dimer, Quant 11.66 (*)    All other components within normal limits  I-STAT CG4 LACTIC ACID, ED - Abnormal; Notable for the following components:   Lactic Acid, Venous 11.1 (*)    All other components within normal limits  TROPONIN T, HIGH SENSITIVITY - Abnormal; Notable for the following components:   Troponin T High Sensitivity 93 (*)    All other components within normal limits  RESP PANEL BY RT-PCR (RSV, FLU A&B, COVID)  RVPGX2  CULTURE, BLOOD (ROUTINE X 2)  CULTURE, BLOOD (ROUTINE X 2)  RESPIRATORY PANEL BY PCR  MRSA NEXT GEN BY PCR, NASAL  PROTIME-INR  URINALYSIS, W/ REFLEX TO CULTURE (INFECTION SUSPECTED)  PRO BRAIN NATRIURETIC PEPTIDE  LEGIONELLA PNEUMOPHILA SEROGP 1 UR AG  STREP PNEUMONIAE URINARY ANTIGEN  HEPARIN  LEVEL (UNFRACTIONATED)  I-STAT CG4 LACTIC ACID, ED  I-STAT CG4 LACTIC ACID, ED  TROPONIN T, HIGH SENSITIVITY    EKG: EKG Interpretation Date/Time:  Tuesday September 15 2024 11:03:32 EST Ventricular Rate:  78 PR Interval:  137 QRS Duration:  93 QT Interval:  397 QTC Calculation: 453 R Axis:   188  Text  Interpretation: Sinus rhythm Probable right ventricular hypertrophy Confirmed by Randol Simmonds [phone]) on 09/15/2024 11:08:54 AM  Radiology: CT Angio Chest PE W and/or Wo Contrast Result Date: 09/15/2024 CLINICAL DATA:  Cough which shortness-of-breath and body aches since this morning. High probability of pulmonary emboli. EXAM: CT ANGIOGRAPHY CHEST WITH CONTRAST TECHNIQUE: Multidetector CT imaging of the chest was performed using the standard protocol during bolus administration of intravenous contrast. Multiplanar CT image reconstructions and MIPs were obtained to evaluate the vascular anatomy. RADIATION DOSE REDUCTION: This exam was performed according to the departmental dose-optimization program which includes automated exposure control, adjustment of the mA and/or kV according to patient size and/or use of iterative reconstruction technique. CONTRAST:  60mL OMNIPAQUE  IOHEXOL  350 MG/ML SOLN COMPARISON:  08/22/2024 FINDINGS: Cardiovascular: Mild stable cardiomegaly. Mild calcified plaque over the left main, left anterior descending and left lateral circumflex coronary arteries. Thoracic aorta is normal in caliber. There is mild calcified plaque over the thoracic aorta. Pulmonary arterial system is well opacified and demonstrates multiple emboli over the segmental right pulmonary arteries involving upper middle and lower lobar arteries. Left upper lobe segmental pulmonary artery emboli noted. RV/LV ratio is greater than 1 compatible with right heart strain. Mediastinum/Nodes: No mediastinal or hilar adenopathy. Postsurgical changes over the right hilar region. Remaining mediastinal structures are unremarkable. Lungs/Pleura: Lungs are adequately inflated demonstrate mild centrilobular emphysematous disease. New mild triangular for pleural based opacification over the right lower lobe with associated linear density which may represent atelectasis or infarction. New moderate consolidation over the left lower lobe  with associated small left pleural effusion. Findings likely due to atelectasis although infection is possible. Airways are unremarkable. Upper Abdomen: No acute findings. Moderate calcified plaque over the abdominal aorta. Musculoskeletal: Multiple stable compression fractures over the cervicothoracic junction and upper  thoracic spine. Patient has undergone interval kyphoplasty of the more inferior thoracic spine compression fracture. Review of the MIP images confirms the above findings. IMPRESSION: 1. Multiple bilateral segmental pulmonary artery emboli with evidence of right heart strain. 2. New mild triangular pleural based opacification over the right lower lobe with associated linear density which may represent atelectasis or infarction. 3. New moderate consolidation over the left lower lobe with associated small left pleural effusion. Findings likely due to atelectasis, although infection is possible. 4. Mild stable cardiomegaly. Atherosclerotic coronary artery disease. 5. Multiple stable compression fractures over the cervicothoracic junction and upper thoracic spine. Interval kyphoplasty of the more inferior thoracic spine compression fracture. Aortic Atherosclerosis (ICD10-I70.0) and Emphysema (ICD10-J43.9). Critical Value/emergent results were called by telephone at the time of interpretation on 09/15/2024 at 12:39 p.m. to provider Dr. Elnor, who verbally acknowledged these results. Electronically Signed   By: Toribio Agreste M.D.   On: 09/15/2024 12:39   DG Chest 2 View Result Date: 09/15/2024 EXAM: 2 VIEW(S) XRAY OF THE CHEST 09/15/2024 09:56:23 AM COMPARISON: 08/29/2024 CLINICAL HISTORY: shob FINDINGS: LUNGS AND PLEURA: Mild left basilar airspace opacities slightly increased. Small left pleural effusion. No pulmonary edema. No pneumothorax. HEART AND MEDIASTINUM: Cardiomegaly, unchanged. BONES AND SOFT TISSUES: Interval T7 thoracic vertebral augmentation. Stable postoperative changes of T3 and T4 cement  augmentation. Partially imaged cervical spinal fusion hardware in place. IMPRESSION: 1. Mildly increased left basilar airspace opacities, which may reflect atelectasis or developing pneumonia. 2. Small left pleural effusion. 3. Cardiomegaly, unchanged. Electronically signed by: Dayne Hassell MD 09/15/2024 10:45 AM EST RP Workstation: [id number]     .Critical Care  Performed by: Randol Simmonds, MD Authorized by: Randol Simmonds, MD   Critical care provider statement:    Critical care time (minutes):  45   Critical care was time spent personally by me on the following activities:  Development of treatment plan with patient or surrogate, discussions with consultants, evaluation of patient's response to treatment, examination of patient, ordering and review of laboratory studies, ordering and review of radiographic studies, ordering and performing treatments and interventions, pulse oximetry, re-evaluation of patient's condition and review of old charts    Medications Ordered in the ED  lactated ringers  infusion (has no administration in time range)  0.9 %  sodium chloride  infusion (has no administration in time range)  norepinephrine (LEVOPHED) 4mg  in (0.016 mg/mL) premix infusion (0 mcg/min Intravenous Stopped 09/15/24 1322)  heparin  ADULT infusion 100 units/mL (25000 units/250mL) (1,000 Units/hr Intravenous New Bag/Given 09/15/24 1320)  azithromycin  (ZITHROMAX ) 500 mg in sodium chloride  0.9 % 250 mL IVPB (has no administration in time range)  ceFEPIme  (MAXIPIME ) 2 g in sodium chloride  0.9 % 100 mL IVPB (2 g Intravenous New Bag/Given 09/15/24 1326)  vancomycin  (VANCOREADY) IVPB 1500 mg/300 mL (has no administration in time range)  lactated ringers  bolus 1,000 mL (0 mLs Intravenous Stopped 09/15/24 1140)    And  lactated ringers  bolus 1,000 mL (1,000 mLs Intravenous New Bag/Given 09/15/24 1057)    And  lactated ringers  bolus 500 mL (500 mLs Intravenous New Bag/Given 09/15/24 1139)  cefTRIAXone   (ROCEPHIN ) 2 g in sodium chloride  0.9 % 100 mL IVPB (0 g Intravenous Stopped 09/15/24 1040)  azithromycin  (ZITHROMAX ) 500 mg in sodium chloride  0.9 % 250 mL IVPB (0 mg Intravenous Stopped 09/15/24 1139)  iohexol  (OMNIPAQUE ) 350 MG/ML injection 60 mL (60 mLs Intravenous Contrast Given 09/15/24 1202)  heparin  bolus via infusion 2,000 Units (2,000 Units Intravenous Bolus from Bag 09/15/24 1321)  Clinical Course as of 09/15/24 1326  Tue Sep 15, 2024  1048 Blood pressure still low at bedside.  fluids are infusing.  Will need to monitor closely [JK]  1107 Blood pressure remains low will start pressors [JK]  1108 Basic metabolic panel(!) Creatinine slightly increased compared to previous [JK]  1108 CBC(!) CBC normal [JK]  1108 Chest x-ray shows possible pneumonia on the left side small pleural effusion [JK]  1218 D-dimer, quantitative(!) Dimer elevated 11.66.  Troponin elevated at 93 [JK]  1242 Notified by radiology that patient has a pulmonary embolism on CT scan.  No signs of definite infection.  Will cancel code sepsis [JK]  1255 Lactic acid elevated  [JK]  1257 Case discussed with Dr Milagros.  Will see [JK]    Clinical Course User Index [JK] Randol Simmonds, MD                                 Medical Decision Making Arrival patient's blood pressure noted to be decreased.  She is tachycardic.  Presentation concerning for the possibility of evolving sepsis.  Less likely NSTEMI pulmonary embolism  Problems Addressed: Acute pulmonary embolism with acute cor pulmonale, unspecified pulmonary embolism type [facility]): acute illness or injury that poses a threat to life or bodily functions  Amount and/or Complexity of Data Reviewed Labs: ordered. Decision-making details documented in ED Course. Radiology: ordered and independent interpretation performed.  Risk Prescription drug management. Drug therapy requiring intensive monitoring for toxicity. Decision regarding hospitalization.   Patient  presented to the ED with complaints of chest pain shortness of breath fever.  Presentation concerning for the possibility of sepsis, pneumonia.  Pulmonary embolism was also a concern with her complaints of severe pain and hemoptysis.  Patient was started on sepsis protocol as her initial blood pressures were low.  Chest x-ray showed possible infiltrate.  White blood cell count is not elevated.  No fever here.  Patient had an elevated D-dimer.  Heparin  was ordered  CT angiogram does show evidence of pulmonary embolism.  There is also evidence of right heart strain.  Patient also has elevated troponins and lactic acid level.  I have consulted with pulmonary critical care, Dr. Milagros for admission    Final diagnoses:  Acute pulmonary embolism with acute cor pulmonale, unspecified pulmonary embolism type [facility])    ED Discharge Orders     None          Randol Simmonds, MD 09/15/24 1329

## 2024-09-15 NOTE — Progress Notes (Addendum)
 eLink Physician-Brief Progress Note [name] DOB: 07-19-1941 MRN: [id number]   Date of Service  09/15/2024  HPI/Events of Note  History of lung cancer s/p lobectomy and chemotherapy, renal cell carcinoma with left nephrectomy, history of diastolic heart failure, COPD, chronic respiratory failure, recent hospitalization for spinal fractures.  Brought to the ICU with respiratory failure requiring intubation and mechanical ventilation in the setting of pleural effusion with associated septic shock  Intubated, Central line placed, OG tube placed  eICU Interventions  Add vasopressin, stress dose steroids  Line verified.  OG tube needs to be removed and replaced as it seems to be coiling  Add CVP monitoring   2150 - escalate PEEP, abg in am, epoprostenol in place  2230 -add CBG monitoring and SSI as needed  Intervention Category Minor Interventions: Clinical assessment - ordering diagnostic tests  Nafeesa Dils 09/15/2024, 9:40 PM

## 2024-09-15 NOTE — Procedures (Signed)
 Central Venous Catheter Insertion Procedure Note  AYO GUARINO  [id number]  1940/11/22  Date:09/15/24  Time:7:11 PM   Provider Performing:Kamoni Depree D Emilio   Procedure: Insertion of Non-tunneled Central Venous Catheter(36556) with US  guidance (23062)   Indication(s) Medication administration  Consent Risks of the procedure as well as the alternatives and risks of each were explained to the patient and/or caregiver.  Consent for the procedure was obtained and is signed in the bedside chart  Anesthesia Topical only with 1% lidocaine    Timeout Verified patient identification, verified procedure, site/side was marked, verified correct patient position, special equipment/implants available, medications/allergies/relevant history reviewed, required imaging and test results available.  Sterile Technique Maximal sterile technique including full sterile barrier drape, hand hygiene, sterile gown, sterile gloves, mask, hair covering, sterile ultrasound probe cover (if used).  Procedure Description Area of catheter insertion was cleaned with chlorhexidine  and draped in sterile fashion.  With real-time ultrasound guidance a central venous catheter was placed into the left internal jugular vein. Nonpulsatile blood flow and easy flushing noted in all ports.  The catheter was sutured in place and sterile dressing applied.  Complications/Tolerance None; patient tolerated the procedure well. Chest X-ray is ordered to verify placement for internal jugular or subclavian cannulation.   Chest x-ray is not ordered for femoral cannulation.  EBL Minimal  Specimen(s) None  JD Emilio RIGGERS [facility] 09/15/2024, 7:11 PM  Please see [email or web address] for pager details.  From 7A-7P if no response, please call [phone]. After hours, please call ELink [phone].

## 2024-09-15 NOTE — Progress Notes (Signed)
 Patient brought to the ICU  Desaturating on high flow nasal cannula in the 80s persistently  Discussion had with patient and son decision was made to intubate  Successfully intubated  OG tube placed  Chest x-ray pending

## 2024-09-15 NOTE — Sepsis Progress Note (Signed)
 Elink monitoring for the code sepsis protocol.

## 2024-09-15 NOTE — Progress Notes (Signed)
 Venous duplex lower ext  has been completed. Refer to [facility] under chart review to view preliminary results.   09/15/2024  2:46 PM Alizah Sills, Ricka BIRCH

## 2024-09-15 NOTE — ED Triage Notes (Signed)
 Pt BIB EMS from [facility] with shob, cough, and body aches since this morning. Pt is on 4 l [place], normally on 2l.

## 2024-09-15 NOTE — Progress Notes (Signed)
 Pharmacy Antibiotic Note  Tammy Ball is a 83 y.o. female admitted on 09/15/2024 with pneumonia.  Pharmacy has been consulted for cefepime  & vancomycin  dosing.  Plan: Cefepime  2 gm IV q12hrs Vancomycin  1500 mg IV x 1 followed by Vancomycin  1 gm IV q36 hours for eAUC 493.7, Css min 12.4 using SCr 1.35, Vd 0.72 F/u renal function, WBC, culture data F/u MRSA PCR  Height: 5' 3 (160 cm) Weight: 68.9 kg (152 lb) IBW/kg (Calculated) : 52.4  Temp (24hrs), Avg:99.3 F (37.4 C), Min:99.3 F (37.4 C), Max:99.3 F (37.4 C)  Recent Labs  Lab 09/15/24 1007 09/15/24 1008 09/15/24 1253  WBC  --  10.0  --   CREATININE  --  1.35*  --   LATICACIDVEN 1.9  --  11.1*    Estimated Creatinine Clearance: 31 mL/min (A) (by C-G formula based on SCr of 1.35 mg/dL (H)).    Allergies  Allergen Reactions   Bee Venom Anaphylaxis, Shortness Of Breath, Swelling and Other (See Comments)    Swelling at site (reaction to bees and wasps)   Amlodipine  Swelling and Other (See Comments)    Swelling of the ankles and hands    Levofloxacin Other (See Comments)    Joint pain    Alendronate  Other (See Comments)    Joint pains / hypercalcemia    Hctz [Hydrochlorothiazide ] Palpitations and Other (See Comments)    Sweating     Antimicrobials this admission: 11/4 ceftriaxone  x 1 11/4 azithromycin  x 1 11/4 cefepime >> 11/4 vancomycin >> Dose adjustments this admission:  Microbiology results: 11/4 BCx2 11/4 strep pneumo: 11/4 legionella: 11/4 MRSA: 11/4 RVP:  Thank you for allowing pharmacy to be a part of this patient's care.   Rosaline IVAR Edison, Pharm.D Use secure chat for questions 09/15/2024 1:17 PM

## 2024-09-16 DIAGNOSIS — R6521 Severe sepsis with septic shock: Secondary | ICD-10-CM | POA: Diagnosis not present

## 2024-09-16 DIAGNOSIS — I2609 Other pulmonary embolism with acute cor pulmonale: Secondary | ICD-10-CM

## 2024-09-16 DIAGNOSIS — J9601 Acute respiratory failure with hypoxia: Secondary | ICD-10-CM

## 2024-09-16 DIAGNOSIS — B9683 Acinetobacter baumannii as the cause of diseases classified elsewhere: Secondary | ICD-10-CM

## 2024-09-16 DIAGNOSIS — A419 Sepsis, unspecified organism: Secondary | ICD-10-CM | POA: Diagnosis not present

## 2024-09-16 DIAGNOSIS — N179 Acute kidney failure, unspecified: Secondary | ICD-10-CM

## 2024-09-16 LAB — BLOOD CULTURE ID PANEL (REFLEXED) - BCID2

## 2024-09-16 LAB — HEPATIC FUNCTION PANEL
ALT: 13 U/L (ref 0–44)
AST: 23 U/L (ref 15–41)
Albumin: 2.9 g/dL — ABNORMAL LOW (ref 3.5–5.0)
Alkaline Phosphatase: 60 U/L (ref 38–126)
Bilirubin, Direct: 0.5 mg/dL — ABNORMAL HIGH (ref 0.0–0.2)
Indirect Bilirubin: 0.4 mg/dL (ref 0.3–0.9)
Total Bilirubin: 0.9 mg/dL (ref 0.0–1.2)
Total Protein: 4.9 g/dL — ABNORMAL LOW (ref 6.5–8.1)

## 2024-09-16 LAB — GLUCOSE, CAPILLARY
Glucose-Capillary: 117 mg/dL — ABNORMAL HIGH (ref 70–99)
Glucose-Capillary: 123 mg/dL — ABNORMAL HIGH (ref 70–99)
Glucose-Capillary: 124 mg/dL — ABNORMAL HIGH (ref 70–99)
Glucose-Capillary: 125 mg/dL — ABNORMAL HIGH (ref 70–99)
Glucose-Capillary: 136 mg/dL — ABNORMAL HIGH (ref 70–99)
Glucose-Capillary: 156 mg/dL — ABNORMAL HIGH (ref 70–99)
Glucose-Capillary: 94 mg/dL (ref 70–99)

## 2024-09-16 LAB — POCT I-STAT 7, (LYTES, BLD GAS, ICA,H+H)
Acid-Base Excess: 4 mmol/L — ABNORMAL HIGH (ref 0.0–2.0)
Bicarbonate: 28.4 mmol/L — ABNORMAL HIGH (ref 20.0–28.0)
Calcium, Ion: 1.01 mmol/L — ABNORMAL LOW (ref 1.15–1.40)
HCT: 27 % — ABNORMAL LOW (ref 36.0–46.0)
Hemoglobin: 9.2 g/dL — ABNORMAL LOW (ref 12.0–15.0)
O2 Saturation: 100 %
Patient temperature: 97.9
Potassium: 4.2 mmol/L (ref 3.5–5.1)
Sodium: 135 mmol/L (ref 135–145)
TCO2: 30 mmol/L (ref 22–32)
pCO2 arterial: 41 mmHg (ref 32–48)
pH, Arterial: 7.447 (ref 7.35–7.45)
pO2, Arterial: 237 mmHg — ABNORMAL HIGH (ref 83–108)

## 2024-09-16 LAB — CBC
HCT: 33.1 % — ABNORMAL LOW (ref 36.0–46.0)
HCT: 22.7 % — ABNORMAL LOW (ref 36.0–46.0)
Hemoglobin: 11 g/dL — ABNORMAL LOW (ref 12.0–15.0)
Hemoglobin: 7.3 g/dL — ABNORMAL LOW (ref 12.0–15.0)
MCH: 32.1 pg (ref 26.0–34.0)
MCH: 30.7 pg (ref 26.0–34.0)
MCHC: 33.2 g/dL (ref 30.0–36.0)
MCHC: 32.2 g/dL (ref 30.0–36.0)
MCV: 96.5 fL (ref 80.0–100.0)
MCV: 95.4 fL (ref 80.0–100.0)
Platelets: 156 K/uL (ref 150–400)
Platelets: 96 K/uL — ABNORMAL LOW (ref 150–400)
RBC: 3.43 MIL/uL — ABNORMAL LOW (ref 3.87–5.11)
RBC: 2.38 MIL/uL — ABNORMAL LOW (ref 3.87–5.11)
RDW: 16.3 % — ABNORMAL HIGH (ref 11.5–15.5)
RDW: 16.2 % — ABNORMAL HIGH (ref 11.5–15.5)
WBC: 6.6 K/uL (ref 4.0–10.5)
WBC: 8.2 K/uL (ref 4.0–10.5)
nRBC: 0 % (ref 0.0–0.2)
nRBC: 0 % (ref 0.0–0.2)

## 2024-09-16 LAB — BLOOD GAS, ARTERIAL
Acid-Base Excess: 7.6 mmol/L — ABNORMAL HIGH (ref 0.0–2.0)
Bicarbonate: 32 mmol/L — ABNORMAL HIGH (ref 20.0–28.0)
FIO2: 70 %
O2 Saturation: 100 %
Patient temperature: 36.8
pCO2 arterial: 43 mmHg (ref 32–48)
pH, Arterial: 7.48 — ABNORMAL HIGH (ref 7.35–7.45)
pO2, Arterial: 122 mmHg — ABNORMAL HIGH (ref 83–108)

## 2024-09-16 LAB — BASIC METABOLIC PANEL WITH GFR
Anion gap: 9 (ref 5–15)
BUN: 34 mg/dL — ABNORMAL HIGH (ref 8–23)
CO2: 31 mmol/L (ref 22–32)
Calcium: 8.3 mg/dL — ABNORMAL LOW (ref 8.9–10.3)
Chloride: 94 mmol/L — ABNORMAL LOW (ref 98–111)
Creatinine, Ser: 1.43 mg/dL — ABNORMAL HIGH (ref 0.44–1.00)
GFR, Estimated: 37 mL/min — ABNORMAL LOW (ref >60)
Glucose, Bld: 141 mg/dL — ABNORMAL HIGH (ref 70–99)
Potassium: 4 mmol/L (ref 3.5–5.1)
Sodium: 134 mmol/L — ABNORMAL LOW (ref 135–145)

## 2024-09-16 LAB — STREP PNEUMONIAE URINARY ANTIGEN: Strep Pneumo Urinary Antigen: NEGATIVE

## 2024-09-16 LAB — HEMOGLOBIN A1C
Hgb A1c MFr Bld: 5.8 % — ABNORMAL HIGH (ref 4.8–5.6)
Mean Plasma Glucose: 119.76 mg/dL

## 2024-09-16 LAB — HEPARIN LEVEL (UNFRACTIONATED)
Heparin Unfractionated: >1.1 [IU]/mL — ABNORMAL HIGH (ref 0.30–0.70)
Heparin Unfractionated: >1.1 [IU]/mL — ABNORMAL HIGH (ref 0.30–0.70)

## 2024-09-16 LAB — MRSA NEXT GEN BY PCR, NASAL: MRSA by PCR Next Gen: NOT DETECTED

## 2024-09-16 MED ORDER — MIDAZOLAM HCL 2 MG/2ML IJ SOLN
INTRAMUSCULAR | Status: AC
  Filled 2024-09-16: qty 2

## 2024-09-16 MED ORDER — SODIUM CHLORIDE 0.9 % IV SOLN
3.0000 g | Freq: Two times a day (BID) | INTRAVENOUS | Status: DC
  Administered 2024-09-16 – 2024-09-17 (×2): 3 g via INTRAVENOUS
  Filled 2024-09-16 (×2): qty 8

## 2024-09-16 MED ORDER — ONDANSETRON HCL 4 MG/2ML IJ SOLN
4.0000 mg | Freq: Four times a day (QID) | INTRAMUSCULAR | Status: DC | PRN
  Filled 2024-09-16: qty 2

## 2024-09-16 MED ORDER — HEPARIN (PORCINE) 25000 UT/250ML-% IV SOLN
850.0000 [IU]/h | INTRAVENOUS | Status: DC
  Administered 2024-09-16: 850 [IU]/h via INTRAVENOUS
  Filled 2024-09-16: qty 250

## 2024-09-16 MED ORDER — ATORVASTATIN CALCIUM 40 MG PO TABS
40.0000 mg | ORAL_TABLET | Freq: Every day | ORAL | Status: DC
Start: 2024-09-16 — End: 2024-09-20
  Administered 2024-09-16 – 2024-09-18 (×3): 40 mg
  Filled 2024-09-16 (×4): qty 1

## 2024-09-16 MED ORDER — HEPARIN (PORCINE) 25000 UT/250ML-% IV SOLN: 700.0000 [IU]/h | INTRAVENOUS | Status: DC

## 2024-09-16 MED ORDER — PANTOPRAZOLE SODIUM 40 MG IV SOLR
40.0000 mg | Freq: Every day | INTRAVENOUS | Status: DC
  Administered 2024-09-16 – 2024-09-23 (×8): 40 mg via INTRAVENOUS
  Filled 2024-09-16 (×8): qty 10

## 2024-09-16 MED ORDER — ONDANSETRON HCL 4 MG/2ML IJ SOLN
INTRAMUSCULAR | Status: AC
  Filled 2024-09-16: qty 2

## 2024-09-16 MED ORDER — TOBRAMYCIN SULFATE 80 MG/2ML IJ SOLN
100.0000 mg | INTRAVENOUS | Status: DC
  Administered 2024-09-17: 100 mg via INTRAVENOUS
  Filled 2024-09-16: qty 2.5

## 2024-09-16 MED ORDER — SODIUM CHLORIDE 0.9 % IV SOLN: INTRAVENOUS | Status: AC | PRN

## 2024-09-16 MED ORDER — POLYETHYLENE GLYCOL 3350 17 G PO PACK: 17.0000 g | PACK | Freq: Every day | ORAL | Status: DC | PRN

## 2024-09-16 MED ORDER — DOCUSATE SODIUM 50 MG/5ML PO LIQD
100.0000 mg | Freq: Two times a day (BID) | ORAL | Status: DC | PRN
Start: 2024-09-16 — End: 2024-09-20

## 2024-09-16 MED ORDER — EPINEPHRINE HCL 5 MG/250ML IV SOLN IN NS: 0.5000 ug/min | INTRAVENOUS | Status: DC

## 2024-09-16 MED ORDER — ORAL CARE MOUTH RINSE: 15.0000 mL | OROMUCOSAL | Status: DC | PRN

## 2024-09-16 MED ORDER — ACETAMINOPHEN 325 MG PO TABS
650.0000 mg | ORAL_TABLET | ORAL | Status: DC | PRN
  Administered 2024-09-18 – 2024-09-19 (×2): 650 mg
  Filled 2024-09-16 (×2): qty 2

## 2024-09-16 MED ORDER — FAMOTIDINE 20 MG PO TABS: 20.0000 mg | ORAL_TABLET | Freq: Every day | ORAL | Status: DC

## 2024-09-16 MED ORDER — VITAL HP 1.0 CAL PO LIQD
1000.0000 mL | ORAL | Status: DC
  Administered 2024-09-16 – 2024-09-18 (×3): 1000 mL

## 2024-09-16 MED ORDER — CALCIUM GLUCONATE-NACL 2-0.675 GM/100ML-% IV SOLN
2.0000 g | Freq: Once | INTRAVENOUS | Status: AC
  Administered 2024-09-17: 2000 mg via INTRAVENOUS
  Filled 2024-09-16: qty 100

## 2024-09-16 MED ORDER — SODIUM CHLORIDE 0.9 % IV SOLN
3.0000 g | Freq: Three times a day (TID) | INTRAVENOUS | Status: DC
  Administered 2024-09-16: 3 g via INTRAVENOUS
  Filled 2024-09-16: qty 8

## 2024-09-16 MED ORDER — ORAL CARE MOUTH RINSE
15.0000 mL | OROMUCOSAL | Status: DC
  Administered 2024-09-16 – 2024-09-19 (×37): 15 mL via OROMUCOSAL

## 2024-09-16 MED ORDER — NOREPINEPHRINE 16 MG/250ML-% IV SOLN
0.0000 ug/min | INTRAVENOUS | Status: DC
  Administered 2024-09-16: 27 ug/min via INTRAVENOUS
  Administered 2024-09-17: 10 ug/min via INTRAVENOUS
  Filled 2024-09-16 (×2): qty 250

## 2024-09-16 MED ORDER — TOBRAMYCIN SULFATE 80 MG/2ML IJ SOLN
2.0000 mg/kg | Freq: Once | INTRAVENOUS | Status: AC
  Administered 2024-09-16: 120 mg via INTRAVENOUS
  Filled 2024-09-16: qty 3

## 2024-09-16 MED ORDER — LACTATED RINGERS IV BOLUS
500.0000 mL | Freq: Once | INTRAVENOUS | Status: AC
  Administered 2024-09-16: 500 mL via INTRAVENOUS

## 2024-09-16 NOTE — Progress Notes (Signed)
 eLink Physician-Brief Progress Note [name] DOB: 1941/10/29 MRN: [id number]   Date of Service  09/16/2024  HPI/Events of Note  Had difficulty placing radial arterial lines earlier in the day, femoral arterial line was placed prior to shift change but suddenly having a hard time reading.  Cuff pressures are reading anywhere from 60s-100s systolic.  Escalated norepinephrine dosing.  Patient also found to have a puddle of blood around the line site.  Seems like the catheter was disconnected and blood was leaking, once this was realized, bleeding stopped and appropriate waveform returned.  Obtained an ABG-no critical's but 2 g drop in hemoglobin from last check.  eICU Interventions  Will trend hemoglobin for 24 hours to ensure no ongoing drops.  CBC now, type and screen.  500 cc LR bolus  Otherwise, hemodynamics seem appropriate and unchanged.   0009 - Hgb 11 -> 7.3 - will transfuse 2 units pRBC. Still having oozing around the femoral aline. Will ask ground team to evaluate the line when available.  9557 - heparin  supra therapeutic but otherwise DIC panel unremarkable. Pharmacy adjusting   [phone] -heparin  has been held for almost an hour and a half.  Patient suddenly had another episode of hypotension with rapidly escalating vasopressor requirements.  She was placed in reverse Trendelenburg with moderate improvement and with some time, her pressures improved.  She had copious bleeding and pressure dressing over the right lower extremity A-line site had to be replaced again.  Receiving her second unit of PRBC now.  Will prepare 2 additional units.  ASAP exam-CT angio of the abdomen pelvis with lower extremity runoffs.  Compartments still soft.  Intervention Category Intermediate Interventions: Bleeding - evaluation and treatment with blood products  Cipriana Biller 09/16/2024, 10:16 PM

## 2024-09-16 NOTE — Progress Notes (Signed)
 OT Cancellation Note  Patient Details Name: Tammy Ball MRN: [id number] DOB: Apr 14, 1941   Cancelled Treatment:    Reason Eval/Treat Not Completed: Medical issues which prohibited therapy. Patient is on the vent.   Delanna JINNY Lesches, OTR/L 09/16/2024, 8:48 AM

## 2024-09-16 NOTE — Progress Notes (Signed)
 Initial Nutrition Assessment  DOCUMENTATION CODES:   Not applicable  INTERVENTION:  - Initiate trickle tube feeding via OGT:  Vital HP @ 60mL/hr  - Monitor magnesium , potassium, and phosphorus daily for at least 3 days, MD to replete as needed.  - Once able to advance past trickles, recommend: Vital 1.5 at 45 ml/h (1080 ml per day) *Would recommend starting at 55mL/hr and advance by 10mL Q12H Prosource TF20 60 ml daily Provides 1700 kcal, 93 gm protein, 825 ml free water  daily  - FWF per CCM.   - Monitor weight trends.   NUTRITION DIAGNOSIS:   Inadequate oral intake related to inability to eat as evidenced by NPO status.  GOAL:   Patient will meet greater than or equal to 90% of their needs  MONITOR:   Vent status, Labs, Weight trends, TF tolerance  REASON FOR ASSESSMENT:   Consult Enteral/tube feeding initiation and management (trickle tube feeds)  ASSESSMENT:   83 yo F w/ pertinent PMH lung cancer s/p lobectomy and cehmo, reneal cell carcinoma w/ left nephrectomy, diastolic CHF, COPD on 2L [place] at home, HTN, HLD, CAD who presented with left sided chest pain. Admitted for septic shock d/t PNA and PE.   11/4 Admit; Intubated  Patient is currently intubated on ventilator support MV: 9 L/min Temp (24hrs), Avg:98.6 F (37 C), Min:98.1 F (36.7 C), Max:99 F (37.2 C)  Patient aware and alert on the vent at time of visit, daughter at bedside.  Patient provided nutrition history via writing on a piece of paper.   Reports she used to weigh 197# around 2022 and was able to lose some weight with eating a healthier diet (heart healthy, low sodium).  However, she reports she has lost weight unintentionally over the past ~2 months since she suffered a spinal fracture ~1 month ago.   Per chart review, weight stable between 164-168# from March to August. On 10/15 patient was weighed at 160# and she is now weighed at 152#. This is an 8# or 5% weight loss in 3 weeks, which is  significant for the time frame.   Patient reports at her baseline, she typically eats small and frequent meals/snacks throughout the day. Over the past 1 month, her appetite has been poor and she hasn't been eating very much. She also reports developing GERD since spine fracture and wrote it was so bad she wanted to die.   CCM wanting to start trickle tube feeds only today. Discussed plan with patient and patient worried about GERD. Dicussed only doing low rate for now and that patient is on protonix  and pepcid , which should help if any issues. Encouraged patient to inform RN of any intolerance issues.  Will provide goal TF recs as above once able to advance.    Medications reviewed and include: Colace, Miralax , Protonix , Pepcid  Fentanyl  Levophed @ 18mcg/min Vasopressin @ 0.04 units/min ordered, not doses recorded  Labs reviewed:  Na 134 Creatinine 1.43   NUTRITION - FOCUSED PHYSICAL EXAM:  Flowsheet Row Most Recent Value  Orbital Region No depletion  Upper Arm Region No depletion  Thoracic and Lumbar Region No depletion  Buccal Region Unable to assess  Temple Region Mild depletion  Clavicle Bone Region No depletion  Clavicle and Acromion Bone Region Mild depletion  Scapular Bone Region Unable to assess  Dorsal Hand No depletion  Patellar Region No depletion  Anterior Thigh Region No depletion  Posterior Calf Region No depletion  Edema (RD Assessment) None  Hair Reviewed  Eyes Reviewed  Mouth Unable to assess  Skin Reviewed  Nails Reviewed    Diet Order:   Diet Order             Diet NPO time specified  Diet effective now                   EDUCATION NEEDS:  Education needs have been addressed  Skin:  Skin Assessment: Skin Integrity Issues: Skin Integrity Issues:: Stage II Stage II: Sacrum  Last BM:  PTA  Height:  Ht Readings from Last 1 Encounters:  09/15/24 5' 3 (1.6 m)   Weight:  Wt Readings from Last 1 Encounters:  09/16/24 69.3 kg   Ideal  Body Weight:  52.27 kg  BMI:  Body mass index is 27.06 kg/m.  Estimated Nutritional Needs:  Kcal:  1600-1800 kcals Protein:  90-105 grams Fluid:  >/= 1.6L    Trude Ned RD, LDN Contact via Secure Chat.

## 2024-09-16 NOTE — Procedures (Cosign Needed Addendum)
 Arterial Catheter Insertion Procedure Note  Tammy Ball  [id number]  1941/06/09  Date:09/16/24  Time:4:07 PM    Provider Performing: Ronnald FORBES Gave    Procedure: Insertion of Arterial Line (63379) with US  guidance (23062)   Indication(s) Blood pressure monitoring and/or need for frequent ABGs  Shock AoC hypoxic respiratory failure  Consent Risks of the procedure as well as the alternatives and risks of each were explained to the patient and/or caregiver.  Consent for the procedure was obtained and is signed in the bedside chart  Anesthesia lidocaine  versed   Time Out Verified patient identification, verified procedure, site/side was marked, verified correct patient position, special equipment/implants available, medications/allergies/relevant history reviewed, required imaging and test results available.   Sterile Technique Maximal sterile technique including full sterile barrier drape, hand hygiene, sterile gown, sterile gloves, mask, hair covering, sterile ultrasound probe cover (if used).   Procedure Description Area of catheter insertion was cleaned with chlorhexidine  and draped in sterile fashion. With real-time ultrasound guidance an arterial catheter was placed into the right femoral artery.  Appropriate arterial tracings confirmed on monitor.     Complications/Tolerance None; patient tolerated the procedure well.   EBL Minimal   Specimen(s) None    Ronnald Gave MSN, AGACNP-BC [facility] Pulmonary/Critical Care [facility] Amion for pager  09/16/2024, 4:07 PM

## 2024-09-16 NOTE — Progress Notes (Signed)
 PT Cancellation Note  Patient Details Name: Tammy Ball MRN: [id number] DOB: [birth year]   Cancelled Treatment:    Reason Eval/Treat Not Completed: Medical issues which prohibited therapy Patient requiring ventilator. Will follow. Darice Potters PT Acute Rehabilitation Services Office [phone]   Potters Darice Norris 09/16/2024, 7:29 AM

## 2024-09-16 NOTE — Progress Notes (Signed)
 Unable to obtain morning ABG after multiple attemtps

## 2024-09-16 NOTE — Plan of Care (Signed)
  Problem: Clinical Measurements: Goal: Ability to maintain clinical measurements within normal limits will improve Outcome: Progressing Goal: Respiratory complications will improve Outcome: Progressing   Problem: Education: Goal: Knowledge of General Education information will improve Description: Including pain rating scale, medication(s)/side effects and non-pharmacologic comfort measures Outcome: Not Progressing   Problem: Nutrition: Goal: Adequate nutrition will be maintained Outcome: Not Progressing

## 2024-09-16 NOTE — Progress Notes (Signed)
 Pharmacy Antibiotic Note  CAISLEY BAXENDALE is a 83 y.o. female admitted on 09/15/2024 with pneumonia.  Pharmacy has been consulted for cefepime  & vancomycin  dosing.  On 11/5 BCID resulted + Acinetobacter.  Pharmacy asked to continue vancomycin , d/c cefepime , d/c azithromycin  and begin Unasyn + tobramycin   Plan: Unasyn 3g IV q8h Tobramycin  120mg  IV x 1 dose followed by 100mg  IV q24h (using standard dosing due to high risk of decompensation and multiple organ systems failure) Continue Vancomycin  1 gm IV q36 hours for eAUC 493.7, Css min 12.4 using SCr 1.35, Vd 0.72 Check tobramycin  levels as indicated F/u renal function, WBC, culture data   Height: 5' 3 (160 cm) Weight: 70.7 kg (155 lb 13.8 oz) IBW/kg (Calculated) : 52.4  Temp (24hrs), Avg:98.7 F (37.1 C), Min:98.1 F (36.7 C), Max:99.3 F (37.4 C)  Recent Labs  Lab 09/15/24 1007 09/15/24 1008 09/15/24 1253 09/15/24 2210  WBC  --  10.0  --   --   CREATININE  --  1.35*  --   --   LATICACIDVEN 1.9  --  11.1* 1.3    Estimated Creatinine Clearance: 31.3 mL/min (A) (by C-G formula based on SCr of 1.35 mg/dL (H)).    Allergies  Allergen Reactions   Bee Venom Anaphylaxis, Shortness Of Breath, Swelling and Other (See Comments)    Swelling at site (reaction to bees and wasps)   Amlodipine  Swelling and Other (See Comments)    Swelling of the ankles and hands    Levofloxacin Other (See Comments)    Joint pain    Alendronate  Other (See Comments)    Joint pains / hypercalcemia    Hctz [Hydrochlorothiazide ] Palpitations and Other (See Comments)    Sweating     Antimicrobials this admission: 11/4 ceftriaxone  x 1 11/4 azithromycin  >> 11/5 11/4 cefepime >>11/5 11/4 vancomycin >> 11/5 unasyn >> 11/5 tobramycin >>  Tobramycin  level goals: Peak 6-8 mcg/ml Trough < 2 mcg/ml  Dose adjustments this admission:  Microbiology results: 11/4 BCx2: Acinetobacter calcoaceticus-baumannii 11/4 strep pneumo: 11/4 legionella: 11/4 MRSA:  negative 11/4 [id number]  Thank you for allowing pharmacy to be a part of this patient's care.   Tahjay Binion, PharmD 09/16/2024 4:22 AM

## 2024-09-16 NOTE — TOC Initial Note (Signed)
 Transition of Care [facility]) - Initial/Assessment Note    Patient Details  Name: Tammy Ball MRN: [id number] Date of Birth: [birth year]  [facility] CM/SW Contact:    Bascom Service, RN Phone Number: 09/16/2024, 3:41 PM  Clinical Narrative: Spoke to dtr Nikki-decline return back to [facility] a new ST SNF if recc. Curently on vent,TF, 02-will monitor for referrals-PT/OT, & appropriate time to do fl2 if needed.                  Expected Discharge Plan: Skilled Nursing Facility Barriers to Discharge: Continued Medical Work up   Patient Goals and CMS Choice Patient states their goals for this hospitalization and ongoing recovery are:: a new Rehab facility-not to return to [email or web address].gov Compare Post Acute Care list provided to:: Patient Represenative (must comment) (Nikki(dtr0) Choice offered to / list presented to : Adult Children [place] ownership interest in [facility].provided to:: Adult Children    Expected Discharge Plan and Services   Discharge Planning Services: CM Consult Post Acute Care Choice: Skilled Nursing Facility Living arrangements for the past 2 months: Skilled Nursing Facility                                      Prior Living Arrangements/Services Living arrangements for the past 2 months: Skilled Nursing Facility Lives with:: Facility Resident              Current home services: DME (02 @ facility)    Activities of Daily Living   ADL Screening (condition at time of admission) Independently performs ADLs?: Yes (appropriate for developmental age) Does the patient have a NEW difficulty with bathing/dressing/toileting/self-feeding that is expected to last >3 days?: No Does the patient have a NEW difficulty with getting in/out of bed, walking, or climbing stairs that is expected to last >3 days?: No Does the patient have a NEW difficulty with communication that is expected to last >3 days?: No Is  the patient deaf or have difficulty hearing?: No Does the patient have difficulty seeing, even when wearing glasses/contacts?: No Does the patient have difficulty concentrating, remembering, or making decisions?: No  Permission Sought/Granted                  Emotional Assessment              Admission diagnosis:  Septic shock (HCC) [A41.9, R65.21] Acute pulmonary embolism with acute cor pulmonale, unspecified pulmonary embolism type (HCC) [I26.09] Patient Active Problem List   Diagnosis Date Noted   Septic shock (HCC) 09/15/2024   Acute on chronic respiratory failure (HCC) 08/26/2024   Closed compression fracture of thoracic vertebra (HCC) 08/25/2024   Class 1 obesity due to excess calories with serious comorbidity and body mass index (BMI) of 30.0 to 30.9 in adult 03/20/2023   Iron deficiency anemia 11/15/2022   Internal hemorrhoids 11/15/2022   History of myocardial infarction 09/20/2022   Gastroesophageal reflux disease without esophagitis 06/19/2022   Hyponatremia 06/09/2022   Chronic bilateral low back pain without sciatica 09/06/2021   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) 02/21/2021   Recurrent major depressive disorder, in full remission 02/21/2021   Atherosclerosis of aorta 02/21/2021   Chronic diastolic CHF (congestive heart failure) (HCC)    Stage 3a chronic kidney disease (HCC) 11/10/2020   Chronic respiratory failure with hypoxia (HCC) 12/26/2018  Age-related osteoporosis with current pathological fracture 11/21/2018   Renal insufficiency 05/20/2018   History of compression fracture of spine 12/10/2016   Insomnia 05/16/2016   S/P lobectomy of lung 01/18/2016   Chemotherapy-induced neuropathy 11/01/2015   Non-small cell carcinoma of right lung, stage 2 (HCC) 10/03/2015   Hyperlipidemia 08/14/2013   Spondylolisthesis of lumbar region 07/03/2013   Essential hypertension 12/17/2011   COPD GOLD B-C 12/17/2011    Renal cell cancer (HCC) 12/17/2011   Depression 12/17/2011   PCP:  Purcell [name], MD Pharmacy:   [facility] - [place], [place] - [phone] [address] [place] [address] Phone: [phone] Fax: [phone]     Social Drivers of Health (SDOH) Social History: SDOH Screenings   Food Insecurity: No Food Insecurity (09/15/2024)  Housing: Low Risk  (09/15/2024)  Transportation Needs: No Transportation Needs (09/15/2024)  Utilities: Not At Risk (09/15/2024)  Alcohol  Screen: Low Risk  (06/02/2024)  Depression (PHQ2-9): Medium Risk (08/25/2024)  Financial Resource Strain: Medium Risk (06/02/2024)  Physical Activity: Inactive (06/02/2024)  Social Connections: Moderately Integrated (09/15/2024)  Recent Concern: Social Connections - Socially Isolated (08/27/2024)  Stress: No Stress Concern Present (06/02/2024)  Tobacco Use: Medium Risk (09/15/2024)  Health Literacy: Adequate Health Literacy (06/02/2024)   SDOH Interventions:     Readmission Risk Interventions    08/31/2024    3:47 PM 06/11/2022   12:06 PM  Readmission Risk Prevention Plan  Transportation Screening Complete Complete  PCP or Specialist Appt within 3-5 Days Complete Complete  HRI or Home Care Consult Complete Complete  Social Work Consult for Recovery Care Planning/Counseling Complete Complete  Palliative Care Screening Not Applicable Not Applicable  Medication Review Oceanographer) Complete Complete

## 2024-09-16 NOTE — Progress Notes (Signed)
 NAME:  Tammy Ball, MRN:  [id number], DOB:  07-18-1941, LOS: 1 ADMISSION DATE:  09/15/2024, CONSULTATION DATE:  11/4 REFERRING MD:  Dr. Randol, CHIEF COMPLAINT:  septic shock; pna; PE   History of Present Illness:  Patient is a 83 yo F w/ pertinent PMH lung cancer s/p lobectomy and cehmo, reneal cell carcinoma w/ left nephrectomy, diastolic chf, COPD on 2L [place] at home, thoracic spinal fracture involving T7, htn, hld, cad presents to [facility] on 11/4 w/ left sided chest pain.  Patient admitted last month for back. Imaging noted T7 compression spinal fracture. While in hospital patient developed worsening respiratory failure thought 2/2 to hypoventilation/atelectasis from severe pain requiring bipap. Completed abx for possible pna. IR performed kyphoplasty on 10/21 w/ improvement. Discharged to SNF for ongoing therapy.  On 11/3 patient developed cough w/ blood tinged sputum. On 11/4 patient woke up w/ acute left sided chest/back pain w/ sob. States she felt like she had fever and bodyaches. O2 had to be increased from 2 to 4L at facility. Brought to [facility] ED. On arrival patient temp 99.3 F and wbc 10. LA 1.9. BS w/ rhonchi/rales. BP soft. CXR w/ increased left basilar opacities and small left pleural effusion. Cultures obtained, given fluids, and started on rocephin /azithro. UA pending. D-dimer elevated 11.66 and trop 93. CTA chest showing multiple b/l segmental PE w/ evidence of R heart strain; RLL pleural based opacification; moderate consolidation over LLL w/ small left pleural effusion. Heparin  started. Despite iv fluids patient remained hypotensive requiring levo. Pccm consulted.  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   CHF (congestive heart failure) (HCC)    Claustrophobia    COPD (chronic obstructive pulmonary disease) (HCC)    Depression    Encounter  for antineoplastic chemotherapy 02/09/2016   GERD (gastroesophageal reflux disease)    Headache    prior to menopause   Heart murmur    History of echocardiogram    Echo 2/17: EF 60-65%, grade 1 diastolic dysfunction, mild MR, trivial pericardial effusion   History of nuclear stress test    Myoview 2/17: no ischemia or scar, EF 79%; low risk   History of radiation therapy 10/30/16-11/06/16   Hyperlipidemia    Hypertension    Insomnia 05/16/2016   Myocardial infarction (HCC)    Non-small cell carcinoma of right lung, stage 2 (HCC) 10/03/2015   Oxygen  deficiency    Pneumonia    Radiation 02/29/16-04/10/16   50.4 Gy to right central chest   Renal cell carcinoma (HCC)    L nephrectomy  in 2012     Significant Hospital Events: Including procedures, antibiotic start and stop dates in addition to other pertinent events   11/4 admit w/ pna and pe on pressors 11/4 intubated, central venous access, on pressors-Levophed, on epoprostenol,   Antibiotics Unasyn 11/4>> Azithromycin  11/4. Cefepime  11/4. Ceftriaxone  11/4. Tobramycin  11/4>> Vancomycin  11/4>>  Cultures Acinetobacter and blood cultures  Interim History / Subjective:  On vent, awake and interactive Gives a thumbs up Denies pain or discomfort  Objective    Blood pressure (!) 135/54, pulse 81, temperature 98.8 F (37.1 C), temperature source Axillary, resp. rate (!) 22, height 5' 3 (1.6 m), weight 69.3 kg, SpO2 99%. CVP:  [3 mmHg-14 mmHg] 14 mmHg  Vent Mode: PRVC FiO2 (%):  [80 %-100 %]  100 % Set Rate:  [20 bmp] 20 bmp Vt Set:  [410 mL] 410 mL PEEP:  [10 cmH20-12 cmH20] 12 cmH20 Plateau Pressure:  [22 cmH20] 22 cmH20   Intake/Output Summary (Last 24 hours) at 09/16/2024 9166 Last data filed at 09/16/2024 0715 Gross per 24 hour  Intake 4971.75 ml  Output 265 ml  Net 4706.75 ml   Filed Weights   09/15/24 1102 09/15/24 1800 09/16/24 0500  Weight: 68.9 kg 70.7 kg 69.3 kg    Examination: General: Elderly,  chronically ill-appearing HEENT: Moist oral mucosa, endotracheal tube in place Neuro: Awake and alert, interactive, moving all extremities CV: S1-S2 appreciated PULM: Clear breath sounds anteriorly GI: Soft, bowel sounds appreciated Extremities: Skin is warm and dry  I reviewed last 24 h vitals and pain scores, last 48 h intake and output, last 24 h labs and trends, and last 24 h imaging results.  BUN of 34, creatinine of 1.43 Acinetobacter and blood cultures  Resolved problem list   Assessment and Plan   Septic shock secondary to pneumonia Acinetobacter infection -Continue Levophed at 18 mcg/min and vasopressin 0.04 units/min -Follow sensitivities for Acinetobacter -Trend WBC/fever curve - Follow tracheal aspirate  Pulmonary embolism -Continue heparin  -BNP of 866, troponin of 83 - Echo does show normal right-sided function  Acute hypoxemic respiratory failure with hypoxia Chronic hypoxic respiratory failure on 2 L at baseline Pneumonia - Continue mechanical ventilation  -Target TVol 6-8cc/kgIBW -Target Plateau Pressure < 30cm H20 -Target driving pressure less than 15 cm of water  -Ventilator associated pneumonia prevention protocol  Hemoptysis likely secondary to pneumonia and pulmonary embolism - Continue to monitor - Not actively bleeding at present - Will have to consider TXA nebs if active bleeding with anticoagulation  Pulmonary hypertension Severe hypoxemia -On Veletri-on 15 ng/kg/min - Echocardiogram with normal ejection fraction, normal right systolic function 09/15/2024 - Echocardiogram 08/27/2024-moderately elevated pulmonary artery systolic pressure, elevated left atrial pressure, grade 2 diastolic dysfunction  Acute kidney injury - Continue to trend - Maintain renal perfusion - Avoid nephrotoxic medications  History of recent T7 compression fracture status post balloon kyphoplasty - Pain management  History of diastolic heart failure, hypertension,  hyperlipidemia - Chronic diuretics on hold - Hold antihypertensives  History of lung cancer s/p right upper lobectomy History of renal cell carcinoma status post left nephrectomy - Continue to monitor  Elevated creat Plan: -Trend BMP / urinary output -Replace electrolytes as indicated -Avoid nephrotoxic agents, ensure adequate renal perfusion  Discussed with patient's daughter at bedside Remains very sick  Start trickle feeds Will escalate if remains stable Starting trickle with ongoing pressor requirement  Follow ABG If able to, will start decreasing FiO2  Labs   CBC: Recent Labs  Lab 09/15/24 1008 09/16/24 0440  WBC 10.0 6.6  HGB 12.5 11.0*  HCT 40.4 33.1*  MCV 98.8 96.5  PLT 197 156    Basic Metabolic Panel: Recent Labs  Lab 09/15/24 1008 09/16/24 0440  NA 138 134*  K 4.3 4.0  CL 90* 94*  CO2 38* 31  GLUCOSE 101* 141*  BUN 33* 34*  CREATININE 1.35* 1.43*  CALCIUM  10.1 8.3*   GFR: Estimated Creatinine Clearance: 29.3 mL/min (A) (by C-G formula based on SCr of 1.43 mg/dL (H)). Recent Labs  Lab 09/15/24 1007 09/15/24 1008 09/15/24 1253 09/15/24 2210 09/16/24 0440  WBC  --  10.0  --   --  6.6  LATICACIDVEN 1.9  --  11.1* 1.3  --     Liver Function Tests: Recent  Labs  Lab 09/16/24 0440  AST 23  ALT 13  ALKPHOS 60  BILITOT 0.9  PROT 4.9*  ALBUMIN  2.9*   No results for input(s): LIPASE, AMYLASE in the last 168 hours. No results for input(s): AMMONIA in the last 168 hours.  ABG    Component Value Date/Time   PHART 7.51 (H) 09/15/2024 2310   PCO2ART 41 09/15/2024 2310   PO2ART 88 09/15/2024 2310   HCO3 32.7 (H) 09/15/2024 2310   TCO2 31 01/19/2016 0424   ACIDBASEDEF 1.0 01/18/2016 1120   O2SAT 99.9 09/15/2024 2310     Coagulation Profile: Recent Labs  Lab 09/15/24 1008  INR 0.9    Cardiac Enzymes: No results for input(s): CKTOTAL, CKMB, CKMBINDEX, TROPONINI in the last 168 hours.  HbA1C: No results found for:  HGBA1C  CBG: Recent Labs  Lab 09/15/24 2018 09/16/24 0004 09/16/24 0401 09/16/24 0731  GLUCAP 110* 117* 123* 136*    Review of Systems:    Not having any pain or discomfort, she is awake alert and interactive on the ventilator  Past Medical History:  She,  has a past medical history of Anemia, Anxiety, Arthritis, Asthma, Chemotherapy-induced neuropathy (11/01/2015), CHF (congestive heart failure) (HCC), Claustrophobia, COPD (chronic obstructive pulmonary disease) (HCC), Depression, Encounter for antineoplastic chemotherapy (02/09/2016), GERD (gastroesophageal reflux disease), Headache, Heart murmur, History of echocardiogram, History of nuclear stress test, History of radiation therapy (10/30/16-11/06/16), Hyperlipidemia, Hypertension, Insomnia (05/16/2016), Myocardial infarction (HCC), Non-small cell carcinoma of right lung, stage 2 (HCC) (10/03/2015), Oxygen  deficiency, Pneumonia, Radiation (02/29/16-04/10/16), and Renal cell carcinoma (HCC).   Surgical History:   Past Surgical History:  Procedure Laterality Date   BACK SURGERY     cervical 1991   BIOPSY  11/15/2022   Procedure: BIOPSY;  Surgeon: San Sandor GAILS, DO;  Location: [facility];  Service: Gastroenterology;;   COLONOSCOPY WITH PROPOFOL  N/A 11/15/2022   Procedure: COLONOSCOPY WITH PROPOFOL ;  Surgeon: San Sandor GAILS, DO;  Location: [facility];  Service: Gastroenterology;  Laterality: N/A;   ESOPHAGOGASTRODUODENOSCOPY (EGD) WITH PROPOFOL  N/A 11/15/2022   Procedure: ESOPHAGOGASTRODUODENOSCOPY (EGD) WITH PROPOFOL ;  Surgeon: San Sandor GAILS, DO;  Location: [facility];  Service: Gastroenterology;  Laterality: N/A;   EYE SURGERY     IR GENERIC HISTORICAL  01/16/2017   IR RADIOLOGIST EVAL & MGMT 01/16/2017 [facility]   IR GENERIC HISTORICAL  01/31/2017   IR FLUORO GUIDED NEEDLE PLC ASPIRATION/INJECTION LOC 01/31/2017 [name], MD [facility]   IR GENERIC HISTORICAL  01/31/2017   IR VERTEBROPLASTY CERV/THOR  BX INC UNI/BIL INC/INJECT/IMAGING 01/31/2017 [name], MD [facility]   IR GENERIC HISTORICAL  01/31/2017   IR VERTEBROPLASTY EA ADDL (T&LS) BX INC UNI/BIL INC INJECT/IMAGING 01/31/2017 [name], MD [facility]   IR KYPHO THORACIC WITH BONE BIOPSY  09/01/2024   kidney cancer     LOBECTOMY Right    RUL   NEPHRECTOMY  2012   POLYPECTOMY  11/15/2022   Procedure: POLYPECTOMY;  Surgeon: San Sandor GAILS, DO;  Location: [facility];  Service: Gastroenterology;;   SPINE SURGERY     TUBAL LIGATION     VIDEO ASSISTED THORACOSCOPY (VATS)/WEDGE RESECTION Right 01/18/2016   Procedure: VIDEO ASSISTED THORACOSCOPY (VATS)/LUNG RESECTION, THOROCOTOMY, RIGHT UPPER LOBECTOMY, LYMPH NODE DISSECTION, PLACEMENT OF ON Q;  Surgeon: [name], MD;  Location: [facility];  Service: Thoracic;  Laterality: Right;   VIDEO BRONCHOSCOPY Bilateral 09/20/2015   Procedure: VIDEO BRONCHOSCOPY WITHOUT FLUORO;  Surgeon: [name], MD;  Location: [facility];  Service: Cardiopulmonary;  Laterality: Bilateral;   VIDEO BRONCHOSCOPY N/A 01/18/2016   Procedure: VIDEO BRONCHOSCOPY;  Surgeon: [name], MD;  Location: [facility];  Service: Thoracic;  Laterality: N/A;     Social History:   reports that she quit smoking about 13 years ago. Her smoking use included cigarettes. She started smoking about 63 years ago. She has a 50 pack-year smoking history. She has never used smokeless tobacco. She reports that she does not drink alcohol  and does not use drugs.   Family History:  Her family history includes Birth defects in her sister; Breast cancer in her sister; CAD (age of onset: 7) in her mother; Cancer in her maternal grandmother and mother; Glaucoma in her daughter; Heart attack (age of onset: 52) in her daughter; Heart disease in her sister; Hypertension in her mother; Obesity in her brother. There is no history of Colon cancer, Colon polyps, Esophageal cancer, Rectal cancer, or Stomach cancer.    Allergies Allergies  Allergen Reactions   Bee Venom Anaphylaxis, Shortness Of Breath, Swelling and Other (See Comments)    Swelling at site (reaction to bees and wasps)   Amlodipine  Swelling and Other (See Comments)    Swelling of the ankles and hands    Levofloxacin Other (See Comments)    Joint pain    Alendronate  Other (See Comments)    Joint pains / hypercalcemia    Hctz [Hydrochlorothiazide ] Palpitations and Other (See Comments)    Sweating     The patient is critically ill with multiple organ systems failure and requires high complexity decision making for assessment and support, frequent evaluation and titration of therapies, application of advanced monitoring technologies and extensive interpretation of multiple databases. Critical Care Time devoted to patient care services described in this note independent of APP/resident time (if applicable)  is 45 minutes.   Jennet Epley MD [facility] Personal pager: See Amion If unanswered, please page CCM On-call: #[phone]

## 2024-09-16 NOTE — Progress Notes (Signed)
 RT attempted aline placement, times 2, with no success. MD notified.

## 2024-09-16 NOTE — Progress Notes (Signed)
 PHARMACY - PHYSICIAN COMMUNICATION CRITICAL VALUE ALERT - BLOOD CULTURE IDENTIFICATION (BCID)  Tammy Ball is an 83 y.o. female who presented to [facility] on 09/15/2024 with shock, septic due to pneumonia, bilateral pulmonary embolism.  Assessment: BCID + Acinetobacter calcoaceticus-baumannii in 1 out of 4 bottles  Name of physician (or Provider) Contacted: Dr A. Paliwal  Current antibiotics: Vancomycin , Cefepime , Azithromycin   Changes to prescribed antibiotics recommended:  Continue Vancomycin  D/C Cefepime  and Azithromycin  Begin Unasyn & Tobramycin   Results for orders placed or performed during the hospital encounter of 09/15/24  Blood Culture ID Panel (Reflexed) (Collected: 09/15/2024 10:08 AM)  Result Value Ref Range   Enterococcus faecalis NOT DETECTED NOT DETECTED   Enterococcus Faecium NOT DETECTED NOT DETECTED   Listeria monocytogenes NOT DETECTED NOT DETECTED   Staphylococcus species NOT DETECTED NOT DETECTED   Staphylococcus aureus (BCID) NOT DETECTED NOT DETECTED   Staphylococcus epidermidis NOT DETECTED NOT DETECTED   Staphylococcus lugdunensis NOT DETECTED NOT DETECTED   Streptococcus species NOT DETECTED NOT DETECTED   Streptococcus agalactiae NOT DETECTED NOT DETECTED   Streptococcus pneumoniae NOT DETECTED NOT DETECTED   Streptococcus pyogenes NOT DETECTED NOT DETECTED   A.calcoaceticus-baumannii DETECTED (A) NOT DETECTED   Bacteroides fragilis NOT DETECTED NOT DETECTED   Enterobacterales NOT DETECTED NOT DETECTED   Enterobacter cloacae complex NOT DETECTED NOT DETECTED   Escherichia coli NOT DETECTED NOT DETECTED   Klebsiella aerogenes NOT DETECTED NOT DETECTED   Klebsiella oxytoca NOT DETECTED NOT DETECTED   Klebsiella pneumoniae NOT DETECTED NOT DETECTED   Proteus species NOT DETECTED NOT DETECTED   Salmonella species NOT DETECTED NOT DETECTED   Serratia marcescens NOT DETECTED NOT DETECTED   Haemophilus influenzae NOT DETECTED NOT DETECTED   Neisseria  meningitidis NOT DETECTED NOT DETECTED   Pseudomonas aeruginosa NOT DETECTED NOT DETECTED   Stenotrophomonas maltophilia NOT DETECTED NOT DETECTED   Candida albicans NOT DETECTED NOT DETECTED   Candida auris NOT DETECTED NOT DETECTED   Candida glabrata NOT DETECTED NOT DETECTED   Candida krusei NOT DETECTED NOT DETECTED   Candida parapsilosis NOT DETECTED NOT DETECTED   Candida tropicalis NOT DETECTED NOT DETECTED   Cryptococcus neoformans/gattii NOT DETECTED NOT DETECTED   CTX-M ESBL NOT DETECTED NOT DETECTED   Carbapenem resistance IMP NOT DETECTED NOT DETECTED   Carbapenem resistance KPC NOT DETECTED NOT DETECTED   Carbapenem resistance NDM NOT DETECTED NOT DETECTED   Carbapenem resistance VIM NOT DETECTED NOT DETECTED    Arvin Gauss, PharmD 09/16/2024  3:59 AM

## 2024-09-16 NOTE — Progress Notes (Signed)
 PHARMACY - ANTICOAGULATION CONSULT NOTE  Pharmacy Consult for Heparin  Indication: pulmonary embolus  Allergies  Allergen Reactions   Bee Venom Anaphylaxis, Shortness Of Breath, Swelling and Other (See Comments)    Swelling at site (reaction to bees and wasps)   Amlodipine  Swelling and Other (See Comments)    Swelling of the ankles and hands    Levofloxacin Other (See Comments)    Joint pain    Alendronate  Other (See Comments)    Joint pains / hypercalcemia    Hctz [Hydrochlorothiazide ] Palpitations and Other (See Comments)    Sweating     Patient Measurements: Height: 5' 3 (160 cm) Weight: 69.3 kg (152 lb 12.5 oz) IBW/kg (Calculated) : 52.4 HEPARIN  DW (KG): 67.1  Vital Signs: Temp: 98.8 F (37.1 C) (11/05 0400) Temp Source: Axillary (11/05 0400) BP: 135/54 (11/05 0645) Pulse Rate: 81 (11/05 0645)  Labs: Recent Labs    09/15/24 1008 09/16/24 0440 09/16/24 0818  HGB 12.5 11.0*  --   HCT 40.4 33.1*  --   PLT 197 156  --   LABPROT 12.8  --   --   INR 0.9  --   --   HEPARINUNFRC  --   --  >1.10*  CREATININE 1.35* 1.43*  --     Estimated Creatinine Clearance: 29.3 mL/min (A) (by C-G formula based on SCr of 1.43 mg/dL (H)).   Medical History: Past Medical History:  Diagnosis Date   Anemia    was while doing chemo   Anxiety    Arthritis    Asthma    Chemotherapy-induced neuropathy 11/01/2015   CHF (congestive heart failure) (HCC)    Claustrophobia    COPD (chronic obstructive pulmonary disease) (HCC)    Depression    Encounter for antineoplastic chemotherapy 02/09/2016   GERD (gastroesophageal reflux disease)    Headache    prior to menopause   Heart murmur    History of echocardiogram    Echo 2/17: EF 60-65%, grade 1 diastolic dysfunction, mild MR, trivial pericardial effusion   History of nuclear stress test    Myoview 2/17: no ischemia or scar, EF 79%; low risk   History of radiation therapy 10/30/16-11/06/16   Hyperlipidemia    Hypertension     Insomnia 05/16/2016   Myocardial infarction (HCC)    Non-small cell carcinoma of right lung, stage 2 (HCC) 10/03/2015   Oxygen  deficiency    Pneumonia    Radiation 02/29/16-04/10/16   50.4 Gy to right central chest   Renal cell carcinoma (HCC)    L nephrectomy  in 2012    Assessment: AC/Heme: UFH for B PE w/ R heart strain, ddimer 11.66 - Hgb 12.5>11 - Plts 197>156 - Hep level >1.1 (level from central line, heparin  running in arm). No bleeding noted  Goal of Therapy:  Heparin  level 0.3-0.7 units/ml Monitor platelets by anticoagulation protocol: Yes   Plan:  IV Hep hold x 1 hour (0900-1000) Decrease to 850 units/hr Recheck heparin  level in 6-8 hrs after resumed   Tammy Ball, PharmD, BCPS Clinical Staff Pharmacist  Tammy Ball 09/16/2024,9:04 AM

## 2024-09-16 NOTE — Progress Notes (Signed)
 PHARMACY - ANTICOAGULATION CONSULT NOTE  Pharmacy Consult for Heparin  Indication: pulmonary embolus  Allergies  Allergen Reactions   Bee Venom Anaphylaxis, Shortness Of Breath, Swelling and Other (See Comments)    Swelling at site (reaction to bees and wasps)   Amlodipine  Swelling and Other (See Comments)    Swelling of the ankles and hands    Levofloxacin Other (See Comments)    Joint pain    Alendronate  Other (See Comments)    Joint pains / hypercalcemia    Hctz [Hydrochlorothiazide ] Palpitations and Other (See Comments)    Sweating     Patient Measurements: Height: 5' 3 (160 cm) Weight: 69.3 kg (152 lb 12.5 oz) IBW/kg (Calculated) : 52.4 HEPARIN  DW (KG): 67.1  Vital Signs: Temp: 98.2 F (36.8 C) (11/05 1609) Temp Source: Oral (11/05 1609) BP: 121/46 (11/05 1800) Pulse Rate: 77 (11/05 1800)  Labs: Recent Labs    09/15/24 1008 09/16/24 0440 09/16/24 0818 09/16/24 1659  HGB 12.5 11.0*  --   --   HCT 40.4 33.1*  --   --   PLT 197 156  --   --   LABPROT 12.8  --   --   --   INR 0.9  --   --   --   HEPARINUNFRC  --   --  >1.10* >1.10*  CREATININE 1.35* 1.43*  --   --     Estimated Creatinine Clearance: 29.3 mL/min (A) (by C-G formula based on SCr of 1.43 mg/dL (H)).   Medical History: Past Medical History:  Diagnosis Date   Anemia    was while doing chemo   Anxiety    Arthritis    Asthma    Chemotherapy-induced neuropathy 11/01/2015   CHF (congestive heart failure) (HCC)    Claustrophobia    COPD (chronic obstructive pulmonary disease) (HCC)    Depression    Encounter for antineoplastic chemotherapy 02/09/2016   GERD (gastroesophageal reflux disease)    Headache    prior to menopause   Heart murmur    History of echocardiogram    Echo 2/17: EF 60-65%, grade 1 diastolic dysfunction, mild MR, trivial pericardial effusion   History of nuclear stress test    Myoview 2/17: no ischemia or scar, EF 79%; low risk   History of radiation therapy  10/30/16-11/06/16   Hyperlipidemia    Hypertension    Insomnia 05/16/2016   Myocardial infarction (HCC)    Non-small cell carcinoma of right lung, stage 2 (HCC) 10/03/2015   Oxygen  deficiency    Pneumonia    Radiation 02/29/16-04/10/16   50.4 Gy to right central chest   Renal cell carcinoma (HCC)    L nephrectomy  in 2012    Assessment: AC/Heme: UFH for B PE w/ R heart strain, ddimer 11.66 - Hgb 12.5>11 - Plts 197>156 - Hep level >1.1 (level from central line, heparin  running in arm). No bleeding noted  Goal of Therapy:  Heparin  level 0.3-0.7 units/ml Monitor platelets by anticoagulation protocol: Yes   Plan:  IV Hep hold x 1 hour (1800-1900) Decrease heparin  to 700 units/hr Recheck heparin  level in 6-8 hrs after resumed   Tennyson Wacha, PharmD, BCPS 09/16/2024 7:14 PM

## 2024-09-17 ENCOUNTER — Inpatient Hospital Stay (HOSPITAL_COMMUNITY)

## 2024-09-17 DIAGNOSIS — D649 Anemia, unspecified: Secondary | ICD-10-CM

## 2024-09-17 DIAGNOSIS — T82898A Other specified complication of vascular prosthetic devices, implants and grafts, initial encounter: Secondary | ICD-10-CM | POA: Diagnosis not present

## 2024-09-17 DIAGNOSIS — D696 Thrombocytopenia, unspecified: Secondary | ICD-10-CM

## 2024-09-17 DIAGNOSIS — I5033 Acute on chronic diastolic (congestive) heart failure: Secondary | ICD-10-CM

## 2024-09-17 DIAGNOSIS — I708 Atherosclerosis of other arteries: Secondary | ICD-10-CM | POA: Diagnosis not present

## 2024-09-17 DIAGNOSIS — A419 Sepsis, unspecified organism: Secondary | ICD-10-CM | POA: Diagnosis not present

## 2024-09-17 DIAGNOSIS — J9621 Acute and chronic respiratory failure with hypoxia: Secondary | ICD-10-CM | POA: Diagnosis not present

## 2024-09-17 DIAGNOSIS — R6521 Severe sepsis with septic shock: Secondary | ICD-10-CM | POA: Diagnosis not present

## 2024-09-17 DIAGNOSIS — I2609 Other pulmonary embolism with acute cor pulmonale: Secondary | ICD-10-CM | POA: Diagnosis not present

## 2024-09-17 DIAGNOSIS — I4891 Unspecified atrial fibrillation: Secondary | ICD-10-CM

## 2024-09-17 DIAGNOSIS — I700 Atherosclerosis of aorta: Secondary | ICD-10-CM | POA: Diagnosis not present

## 2024-09-17 LAB — GLUCOSE, CAPILLARY
Glucose-Capillary: 90 mg/dL (ref 70–99)
Glucose-Capillary: 131 mg/dL — ABNORMAL HIGH (ref 70–99)
Glucose-Capillary: 124 mg/dL — ABNORMAL HIGH (ref 70–99)
Glucose-Capillary: 130 mg/dL — ABNORMAL HIGH (ref 70–99)
Glucose-Capillary: 151 mg/dL — ABNORMAL HIGH (ref 70–99)
Glucose-Capillary: 155 mg/dL — ABNORMAL HIGH (ref 70–99)

## 2024-09-17 LAB — BLOOD GAS, ARTERIAL
Acid-Base Excess: 3.6 mmol/L — ABNORMAL HIGH (ref 0.0–2.0)
Acid-Base Excess: 2.3 mmol/L — ABNORMAL HIGH (ref 0.0–2.0)
Bicarbonate: 27.7 mmol/L (ref 20.0–28.0)
Bicarbonate: 27.8 mmol/L (ref 20.0–28.0)
Drawn by: 75061
Drawn by: 75061
FIO2: 50 %
FIO2: 50 %
MECHVT: 410 mL
MECHVT: 410 mL
O2 Saturation: 99.5 %
O2 Saturation: 99.2 %
PEEP: 12 cmH2O
PEEP: 12 cmH2O
Patient temperature: 37.3
Patient temperature: 37.4
RATE: 20 {breaths}/min
RATE: 20 {breaths}/min
pCO2 arterial: 40 mmHg (ref 32–48)
pCO2 arterial: 47 mmHg (ref 32–48)
pH, Arterial: 7.46 — ABNORMAL HIGH (ref 7.35–7.45)
pH, Arterial: 7.38 (ref 7.35–7.45)
pO2, Arterial: 104 mmHg (ref 83–108)
pO2, Arterial: 98 mmHg (ref 83–108)

## 2024-09-17 LAB — DIC (DISSEMINATED INTRAVASCULAR COAGULATION)PANEL
D-Dimer, Quant: 1.52 ug{FEU}/mL — ABNORMAL HIGH (ref 0.00–0.50)
D-Dimer, Quant: 1.41 ug{FEU}/mL — ABNORMAL HIGH (ref 0.00–0.50)
Fibrinogen: 552 mg/dL — ABNORMAL HIGH (ref 210–475)
Fibrinogen: 534 mg/dL — ABNORMAL HIGH (ref 210–475)
INR: 1.3 — ABNORMAL HIGH (ref 0.8–1.2)
INR: 1 (ref 0.8–1.2)
Platelets: 106 K/uL — ABNORMAL LOW (ref 150–400)
Platelets: 80 K/uL — ABNORMAL LOW (ref 150–400)
Prothrombin Time: 16.9 s — ABNORMAL HIGH (ref 11.4–15.2)
Prothrombin Time: 14.3 s (ref 11.4–15.2)
aPTT: >200 s (ref 24–36)
aPTT: 76 s — ABNORMAL HIGH (ref 24–36)

## 2024-09-17 LAB — CBC
HCT: 31.6 % — ABNORMAL LOW (ref 36.0–46.0)
HCT: 32.2 % — ABNORMAL LOW (ref 36.0–46.0)
HCT: 31.8 % — ABNORMAL LOW (ref 36.0–46.0)
Hemoglobin: 10.6 g/dL — ABNORMAL LOW (ref 12.0–15.0)
Hemoglobin: 10.8 g/dL — ABNORMAL LOW (ref 12.0–15.0)
Hemoglobin: 10.9 g/dL — ABNORMAL LOW (ref 12.0–15.0)
MCH: 30.9 pg (ref 26.0–34.0)
MCH: 30.4 pg (ref 26.0–34.0)
MCH: 31 pg (ref 26.0–34.0)
MCHC: 33.5 g/dL (ref 30.0–36.0)
MCHC: 33.5 g/dL (ref 30.0–36.0)
MCHC: 34.3 g/dL (ref 30.0–36.0)
MCV: 92.1 fL (ref 80.0–100.0)
MCV: 90.7 fL (ref 80.0–100.0)
MCV: 90.3 fL (ref 80.0–100.0)
Platelets: 79 K/uL — ABNORMAL LOW (ref 150–400)
Platelets: 82 K/uL — ABNORMAL LOW (ref 150–400)
Platelets: 85 K/uL — ABNORMAL LOW (ref 150–400)
RBC: 3.43 MIL/uL — ABNORMAL LOW (ref 3.87–5.11)
RBC: 3.55 MIL/uL — ABNORMAL LOW (ref 3.87–5.11)
RBC: 3.52 MIL/uL — ABNORMAL LOW (ref 3.87–5.11)
RDW: 15.9 % — ABNORMAL HIGH (ref 11.5–15.5)
RDW: 16.6 % — ABNORMAL HIGH (ref 11.5–15.5)
RDW: 16.7 % — ABNORMAL HIGH (ref 11.5–15.5)
WBC: 9.2 K/uL (ref 4.0–10.5)
WBC: 9.2 K/uL (ref 4.0–10.5)
WBC: 10 K/uL (ref 4.0–10.5)
nRBC: 0 % (ref 0.0–0.2)
nRBC: 0 % (ref 0.0–0.2)
nRBC: 0 % (ref 0.0–0.2)

## 2024-09-17 LAB — BASIC METABOLIC PANEL WITH GFR
Anion gap: 10 (ref 5–15)
BUN: 32 mg/dL — ABNORMAL HIGH (ref 8–23)
CO2: 26 mmol/L (ref 22–32)
Calcium: 8.2 mg/dL — ABNORMAL LOW (ref 8.9–10.3)
Chloride: 99 mmol/L (ref 98–111)
Creatinine, Ser: 1.41 mg/dL — ABNORMAL HIGH (ref 0.44–1.00)
GFR, Estimated: 38 mL/min — ABNORMAL LOW (ref >60)
Glucose, Bld: 139 mg/dL — ABNORMAL HIGH (ref 70–99)
Potassium: 3.5 mmol/L (ref 3.5–5.1)
Sodium: 135 mmol/L (ref 135–145)

## 2024-09-17 LAB — COMPREHENSIVE METABOLIC PANEL WITH GFR
ALT: 10 U/L (ref 0–44)
AST: 14 U/L — ABNORMAL LOW (ref 15–41)
Albumin: 2.2 g/dL — ABNORMAL LOW (ref 3.5–5.0)
Alkaline Phosphatase: 54 U/L (ref 38–126)
Anion gap: 9 (ref 5–15)
BUN: 33 mg/dL — ABNORMAL HIGH (ref 8–23)
CO2: 27 mmol/L (ref 22–32)
Calcium: 8.3 mg/dL — ABNORMAL LOW (ref 8.9–10.3)
Chloride: 96 mmol/L — ABNORMAL LOW (ref 98–111)
Creatinine, Ser: 1.6 mg/dL — ABNORMAL HIGH (ref 0.44–1.00)
GFR, Estimated: 32 mL/min — ABNORMAL LOW (ref >60)
Glucose, Bld: 170 mg/dL — ABNORMAL HIGH (ref 70–99)
Potassium: 4.9 mmol/L (ref 3.5–5.1)
Sodium: 133 mmol/L — ABNORMAL LOW (ref 135–145)
Total Bilirubin: 0.9 mg/dL (ref 0.0–1.2)
Total Protein: 4.2 g/dL — ABNORMAL LOW (ref 6.5–8.1)

## 2024-09-17 LAB — COOXEMETRY PANEL
Carboxyhemoglobin: 1.5 % (ref 0.5–1.5)
Methemoglobin: <0.7 % (ref 0.0–1.5)
O2 Saturation: 74.8 %
Total hemoglobin: 10.9 g/dL — ABNORMAL LOW (ref 12.0–16.0)

## 2024-09-17 LAB — LEGIONELLA PNEUMOPHILA SEROGP 1 UR AG: L. pneumophila Serogp 1 Ur Ag: NEGATIVE

## 2024-09-17 LAB — HEMOGLOBIN AND HEMATOCRIT, BLOOD
HCT: 28.8 % — ABNORMAL LOW (ref 36.0–46.0)
Hemoglobin: 9.9 g/dL — ABNORMAL LOW (ref 12.0–15.0)

## 2024-09-17 LAB — PREPARE RBC (CROSSMATCH)

## 2024-09-17 LAB — TROPONIN T, HIGH SENSITIVITY
Troponin T High Sensitivity: 65 ng/L — ABNORMAL HIGH (ref 0–19)
Troponin T High Sensitivity: 65 ng/L — ABNORMAL HIGH (ref 0–19)

## 2024-09-17 LAB — HEPARIN LEVEL (UNFRACTIONATED)
Heparin Unfractionated: 0.37 [IU]/mL (ref 0.30–0.70)
Heparin Unfractionated: 1.04 [IU]/mL — ABNORMAL HIGH (ref 0.30–0.70)

## 2024-09-17 LAB — LACTIC ACID, PLASMA: Lactic Acid, Venous: 2.9 mmol/L (ref 0.5–1.9)

## 2024-09-17 LAB — PRO BRAIN NATRIURETIC PEPTIDE: Pro Brain Natriuretic Peptide: 1367 pg/mL — ABNORMAL HIGH (ref <300.0)

## 2024-09-17 MED ORDER — SODIUM CHLORIDE 0.9 % IV BOLUS
1000.0000 mL | Freq: Once | INTRAVENOUS | Status: AC
  Administered 2024-09-17: 1000 mL via INTRAVENOUS

## 2024-09-17 MED ORDER — DEXMEDETOMIDINE HCL IN NACL 200 MCG/50ML IV SOLN
0.2000 ug/kg/h | INTRAVENOUS | Status: DC
  Administered 2024-09-17 (×2): 0.2 ug/kg/h via INTRAVENOUS
  Administered 2024-09-18: 0.1 ug/kg/h via INTRAVENOUS
  Administered 2024-09-19 (×2): 0.4 ug/kg/h via INTRAVENOUS
  Administered 2024-09-20: 0.3 ug/kg/h via INTRAVENOUS
  Filled 2024-09-17 (×6): qty 50

## 2024-09-17 MED ORDER — SODIUM CHLORIDE 0.9 % IV SOLN: 3.0000 g | Freq: Four times a day (QID) | INTRAVENOUS | Status: DC

## 2024-09-17 MED ORDER — FENTANYL BOLUS VIA INFUSION: 12.5000 ug | INTRAVENOUS | Status: DC | PRN

## 2024-09-17 MED ORDER — LACTATED RINGERS IV BOLUS
500.0000 mL | Freq: Once | INTRAVENOUS | Status: AC
  Administered 2024-09-17: 500 mL via INTRAVENOUS

## 2024-09-17 MED ORDER — SODIUM CHLORIDE 0.9 % IV SOLN
3.0000 g | Freq: Three times a day (TID) | INTRAVENOUS | Status: DC
  Administered 2024-09-17 – 2024-09-23 (×20): 3 g via INTRAVENOUS
  Filled 2024-09-17 (×19): qty 8

## 2024-09-17 MED ORDER — FUROSEMIDE 10 MG/ML IJ SOLN
20.0000 mg | Freq: Once | INTRAMUSCULAR | Status: AC
  Administered 2024-09-17: 20 mg via INTRAVENOUS
  Filled 2024-09-17: qty 2

## 2024-09-17 MED ORDER — SODIUM BICARBONATE 8.4 % IV SOLN
INTRAVENOUS | Status: AC
  Administered 2024-09-17: 50 meq
  Filled 2024-09-17: qty 50

## 2024-09-17 MED ORDER — CALCIUM GLUCONATE-NACL 2-0.675 GM/100ML-% IV SOLN
2.0000 g | Freq: Once | INTRAVENOUS | Status: AC
  Administered 2024-09-17: 2000 mg via INTRAVENOUS
  Filled 2024-09-17: qty 100

## 2024-09-17 MED ORDER — MINOCYCLINE HCL 50 MG PO CAPS
200.0000 mg | ORAL_CAPSULE | Freq: Two times a day (BID) | ORAL | Status: DC
  Administered 2024-09-17 – 2024-09-19 (×4): 200 mg
  Filled 2024-09-17 (×6): qty 4

## 2024-09-17 MED ORDER — MAGNESIUM SULFATE 2 GM/50ML IV SOLN
2.0000 g | Freq: Once | INTRAVENOUS | Status: AC
  Administered 2024-09-17: 2 g via INTRAVENOUS
  Filled 2024-09-17: qty 50

## 2024-09-17 MED ORDER — SODIUM CHLORIDE 0.9 % IV SOLN: 1.0000 g | Freq: Two times a day (BID) | INTRAVENOUS | Status: DC

## 2024-09-17 MED ORDER — IOHEXOL 350 MG/ML SOLN
100.0000 mL | Freq: Once | INTRAVENOUS | Status: AC | PRN
  Administered 2024-09-17: 100 mL via INTRAVENOUS

## 2024-09-17 MED ORDER — EPINEPHRINE 1 MG/10ML IV SOSY
PREFILLED_SYRINGE | INTRAVENOUS | Status: AC
  Filled 2024-09-17: qty 10

## 2024-09-17 MED ORDER — STERILE WATER FOR INJECTION IJ SOLN: 50.0000 ng/kg/min | INTRAVENOUS | Status: DC

## 2024-09-17 MED ORDER — HEPARIN (PORCINE) 25000 UT/250ML-% IV SOLN: 500.0000 [IU]/h | INTRAVENOUS | Status: DC

## 2024-09-17 MED ORDER — SODIUM CHLORIDE 0.9% IV SOLUTION: Freq: Once | INTRAVENOUS | Status: DC

## 2024-09-17 NOTE — Progress Notes (Signed)
 CT angio results noted  Line was discontinued  Pressure held for 30 minutes

## 2024-09-17 NOTE — Plan of Care (Signed)
  Problem: Education: Goal: Knowledge of General Education information will improve Description: Including pain rating scale, medication(s)/side effects and non-pharmacologic comfort measures Outcome: Progressing   Problem: Clinical Measurements: Goal: Ability to maintain clinical measurements within normal limits will improve Outcome: Progressing Goal: Will remain free from infection Outcome: Progressing Goal: Diagnostic test results will improve Outcome: Progressing Goal: Respiratory complications will improve Outcome: Progressing Goal: Cardiovascular complication will be avoided Outcome: Progressing   Problem: Coping: Goal: Level of anxiety will decrease Outcome: Progressing   

## 2024-09-17 NOTE — Progress Notes (Signed)
 CT angio GIB w c/f possible pseudoaneurysm and some bleeding from arterial access. Read of CTA angio extremity still pending   D/w vvs- rec remove art line and hold manual pressure x 30 min, art puncture site should seal off   D/w RN     Ronnald Gave MSN, AGACNP-BC [facility] 09/17/2024, 12:04 PM

## 2024-09-17 NOTE — Progress Notes (Signed)
 OT Cancellation Note  Patient Details Name: Tammy Ball MRN: [id number] DOB: May 02, 1941   Cancelled Treatment:    Reason Eval/Treat Not Completed: Patient not medically ready Pt remains sedated on vent. Will sign off for acute OT. Please reconsult once medically ready for therapy services.  Mliss Fish 09/17/2024, 12:05 PM

## 2024-09-17 NOTE — Progress Notes (Addendum)
 NAME:  Tammy Ball, MRN:  [id number], DOB:  12/02/1940, LOS: 2 ADMISSION DATE:  09/15/2024, CONSULTATION DATE:  11/4 REFERRING MD:  Dr. Randol, CHIEF COMPLAINT:  septic shock; pna; PE   History of Present Illness:  Patient is a 83 yo F w/ pertinent PMH lung cancer s/p lobectomy and cehmo, reneal cell carcinoma w/ left nephrectomy, diastolic chf, COPD on 2L [place] at home, thoracic spinal fracture involving T7, htn, hld, cad presents to [facility] on 11/4 w/ left sided chest pain.  Patient admitted last month for back. Imaging noted T7 compression spinal fracture. While in hospital patient developed worsening respiratory failure thought 2/2 to hypoventilation/atelectasis from severe pain requiring bipap. Completed abx for possible pna. IR performed kyphoplasty on 10/21 w/ improvement. Discharged to SNF for ongoing therapy.  On 11/3 patient developed cough w/ blood tinged sputum. On 11/4 patient woke up w/ acute left sided chest/back pain w/ sob. States she felt like she had fever and bodyaches. O2 had to be increased from 2 to 4L at facility. Brought to [facility] ED. On arrival patient temp 99.3 F and wbc 10. LA 1.9. BS w/ rhonchi/rales. BP soft. CXR w/ increased left basilar opacities and small left pleural effusion. Cultures obtained, given fluids, and started on rocephin /azithro. UA pending. D-dimer elevated 11.66 and trop 93. CTA chest showing multiple b/l segmental PE w/ evidence of R heart strain; RLL pleural based opacification; moderate consolidation over LLL w/ small left pleural effusion. Heparin  started. Despite iv fluids patient remained hypotensive requiring levo. Pccm consulted.  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   CHF (congestive heart failure) (HCC)    Claustrophobia    COPD (chronic obstructive pulmonary disease) (HCC)    Depression    Encounter  for antineoplastic chemotherapy 02/09/2016   GERD (gastroesophageal reflux disease)    Headache    prior to menopause   Heart murmur    History of echocardiogram    Echo 2/17: EF 60-65%, grade 1 diastolic dysfunction, mild MR, trivial pericardial effusion   History of nuclear stress test    Myoview 2/17: no ischemia or scar, EF 79%; low risk   History of radiation therapy 10/30/16-11/06/16   Hyperlipidemia    Hypertension    Insomnia 05/16/2016   Myocardial infarction (HCC)    Non-small cell carcinoma of right lung, stage 2 (HCC) 10/03/2015   Oxygen  deficiency    Pneumonia    Radiation 02/29/16-04/10/16   50.4 Gy to right central chest   Renal cell carcinoma (HCC)    L nephrectomy  in 2012     Significant Hospital Events: Including procedures, antibiotic start and stop dates in addition to other pertinent events   11/4 admit w/ pna and pe on pressors 11/4 intubated, central venous access, on pressors-Levophed, on epoprostenol, 11/5 art line 11/6 oozig from art line, labile blood pressures and HR. C/f hemorrhagic shock w hgb 7.3 getting 2 PRBC     Interim History / Subjective:   Overnight oozing from art line + variable pressures, hep held and 2 PRBC ordered for hgb 7.3   On US  I did not see a discrete fluid collection in groin, looked like some edematous tissue   Objective    Blood pressure (!) 65/21, pulse (!) 49, temperature 99.3 F (37.4 C), resp. rate 20, height  5' 3 (1.6 m), weight 79.2 kg, SpO2 100%. CVP:  [6 mmHg-26 mmHg] 10 mmHg  Vent Mode: PRVC FiO2 (%):  [50 %-80 %] 50 % Set Rate:  [20 bmp] 20 bmp Vt Set:  [410 mL] 410 mL PEEP:  [12 cmH20] 12 cmH20 Plateau Pressure:  [23 cmH20-25 cmH20] 25 cmH20   Intake/Output Summary (Last 24 hours) at 09/17/2024 1016 Last data filed at 09/17/2024 0920 Gross per 24 hour  Intake 5046.65 ml  Output 700 ml  Net 4346.65 ml   Filed Weights   09/15/24 1800 09/16/24 0500 09/17/24 0500  Weight: 70.7 kg 69.3 kg 79.2 kg     Examination: General: Elderly critically ill appearing F  HEENT:  ETT secure anicteric sclera  Neuro: Sedate, following simple commands  CV: irir  PULM: mechanically ventilated. Some coarse sounds  GI: soft  Extremities: R fem arterial line. No palpable R groin hematoma or thigh swelling, compartments are soft. No palpable flank or lower abd hematoma  Skin: scattered ecchymosis over his extremities, L abdomen, and surrounding L art line   Resolved problem list   Assessment and Plan    Septic shock Acinetobacter bacteremia PNA P -dual therapy unasyn, tobi  -awaiting sensitivities, consider ID consult  -vaso NE steroids   Acute on chronic resp failure w hypoxia  PE pHTN Hx lung ca s/p RUL lobectomy  P -cont MV support -ABG this morning -- see if we can wean her settings  -start to wean epo   AKI  Hx RCC s/p L nephrectomy  P -follow renal indices UOP   Anemia Thrombocytopenia Coagulopathy  -there was overnight concern for hemorrhagic shock related to fem arterial line oozing with hgb 11 dropping to 7.3 she was ordered for 2 PRBC which have not yet completed & repeat hgb is 10.6. I suspect the 7.3 is somewhat spurious, and her overnight hgb was likely more c/w istat read of 9.2 as <2 PRBC transfusion would not reasonable raise ones hgb from 7.3 to 10.6  -on physical exam there is bruising fem site, no palpable hematoma. -suspect her ooziness and easy bruising is likely r/t her heparin  infusion for PE. Consider also developing DIC, but panel not yet c/w this.  P -for CTA GI study and CT angio BLE -- reads are pending -transfuse PRN, goal > 7 or hemodynamically significant bleeding. I d/w RN -- ok to complete current PRBC  -repeat h/h in a few hours. PRN DIC panel  -hep gtt on hold  New Afib/flutter, variable rate  AoC Diastolic HF HTN HLD  P -home meds on hold acutely -heparin  currently on hold w bleeding concerns -will give 2g mag empirically and 2g cal glu  based on prior ical   Thoracic compression fxs Recent T7 kyphoplasty 10/21 w IR  -no acute intervention   Labs   CBC: Recent Labs  Lab 09/15/24 1008 09/16/24 0440 09/16/24 2216 09/16/24 2309 09/17/24 0214 09/17/24 0744  WBC 10.0 6.6  --  8.2  --  9.2  HGB 12.5 11.0* 9.2* 7.3*  --  10.6*  HCT 40.4 33.1* 27.0* 22.7*  --  31.6*  MCV 98.8 96.5  --  95.4  --  92.1  PLT 197 156  --  96* 106* 80*  79*    Basic Metabolic Panel: Recent Labs  Lab 09/15/24 1008 09/16/24 0440 09/16/24 2216 09/17/24 0744  NA 138 134* 135 133*  K 4.3 4.0 4.2 4.9  CL 90* 94*  --  96*  CO2 38* 31  --  27  GLUCOSE 101* 141*  --  170*  BUN 33* 34*  --  33*  CREATININE 1.35* 1.43*  --  1.60*  CALCIUM  10.1 8.3*  --  8.3*   GFR: Estimated Creatinine Clearance: 27.9 mL/min (A) (by C-G formula based on SCr of 1.6 mg/dL (H)). Recent Labs  Lab 09/15/24 1007 09/15/24 1008 09/15/24 1253 09/15/24 2210 09/16/24 0440 09/16/24 2309 09/17/24 0744  WBC  --  10.0  --   --  6.6 8.2 9.2  LATICACIDVEN 1.9  --  11.1* 1.3  --   --  2.9*    Liver Function Tests: Recent Labs  Lab 09/16/24 0440 09/17/24 0744  AST 23 14*  ALT 13 10  ALKPHOS 60 54  BILITOT 0.9 0.9  PROT 4.9* 4.2*  ALBUMIN  2.9* 2.2*   No results for input(s): LIPASE, AMYLASE in the last 168 hours. No results for input(s): AMMONIA in the last 168 hours.  ABG    Component Value Date/Time   PHART 7.46 (H) 09/17/2024 0912   PCO2ART 40 09/17/2024 0912   PO2ART 104 09/17/2024 0912   HCO3 27.7 09/17/2024 0912   TCO2 30 09/16/2024 2216   ACIDBASEDEF 1.0 01/18/2016 1120   O2SAT 74.8 09/17/2024 0931     Coagulation Profile: Recent Labs  Lab 09/15/24 1008 09/17/24 0214 09/17/24 0744  INR 0.9 1.3* 1.0    Cardiac Enzymes: No results for input(s): CKTOTAL, CKMB, CKMBINDEX, TROPONINI in the last 168 hours.  HbA1C: Hgb A1c MFr Bld  Date/Time Value Ref Range Status  09/16/2024 04:40 AM 5.8 (H) 4.8 - 5.6 % Final     Comment:    (NOTE) Diagnosis of Diabetes The following HbA1c ranges recommended by the American Diabetes Association (ADA) may be used as an aid in the diagnosis of diabetes mellitus.  Hemoglobin             Suggested A1C NGSP%              Diagnosis  <5.7                   Non Diabetic  5.7-6.4                Pre-Diabetic  >6.4                   Diabetic  <7.0                   Glycemic control for                       adults with diabetes.      CBG: Recent Labs  Lab 09/16/24 1601 09/16/24 2024 09/16/24 2315 09/17/24 0328 09/17/24 0911  GLUCAP 156* 124* 94 90 131*   CRITICAL CARE Performed by: Ronnald FORBES Gave   Total critical care time: 43 minutes  Critical care time was exclusive of separately billable procedures and treating other patients.  Critical care was necessary to treat or prevent imminent or life-threatening deterioration.  Critical care was time spent personally by me on the following activities: development of treatment plan with patient and/or surrogate as well as nursing, discussions with consultants, evaluation of patient's response to treatment, examination of patient, obtaining history from patient or surrogate, ordering and performing treatments and interventions, ordering and review of laboratory studies, ordering and review of radiographic studies, pulse oximetry and re-evaluation of patient's condition.  Ronnald Gave MSN, AGACNP-BC [place] Pulmonary/Critical Care Medicine Amion for pager  09/17/2024, 10:16 AM

## 2024-09-17 NOTE — Progress Notes (Signed)
 Pt transported on ventilator to CT and back to 1229 with no complications. RN at bedside.

## 2024-09-17 NOTE — Progress Notes (Signed)
 Cross covering ICU physician  Assessed arterial line site per [facility] request. Site is clean dry intact with minimal blood noted under dressing which was changed ~2hours ago.   Will order DIC panel to follow up and replace any factors possible to help with coagulopathy. Plts noted to have fallen to <100 and hgb noted to have fallen 2 grams but remains >7.

## 2024-09-17 NOTE — Progress Notes (Signed)
 Epoprostenol titrated down and turned off and discontinued per MD, PT tolerating well.

## 2024-09-17 NOTE — Progress Notes (Addendum)
 She continues to wean well  Epoprostenol now down to 10 ng  FiO2 down to 40% with PEEP of 10  Patient is easily arousable and interactive, able to write notes to staff  Progressing well

## 2024-09-17 NOTE — Progress Notes (Signed)
 Ordered low-dose Precedex  Limit fentanyl  as possible  Appears to have some sensitivity to opiates from discussion with family

## 2024-09-17 NOTE — Progress Notes (Signed)
 PHARMACY - ANTICOAGULATION CONSULT NOTE  Pharmacy Consult for heparin   Indication: pulmonary embolus  Allergies  Allergen Reactions   Bee Venom Anaphylaxis, Shortness Of Breath, Swelling and Other (See Comments)    Swelling at site (reaction to bees and wasps)   Amlodipine  Swelling and Other (See Comments)    Swelling of the ankles and hands    Levofloxacin Other (See Comments)    Joint pain    Alendronate  Other (See Comments)    Joint pains / hypercalcemia    Hctz [Hydrochlorothiazide ] Palpitations and Other (See Comments)    Sweating     Patient Measurements: Height: 5' 3 (160 cm) Weight: 69.3 kg (152 lb 12.5 oz) IBW/kg (Calculated) : 52.4 HEPARIN  DW (KG): 67.1  Vital Signs: Temp: 98.8 F (37.1 C) (11/06 0300) Temp Source: Bladder (11/06 0215) BP: 84/30 (11/06 0000) Pulse Rate: 61 (11/06 0215)  Labs: Recent Labs    09/15/24 1008 09/16/24 0440 09/16/24 0818 09/16/24 1659 09/16/24 2216 09/16/24 2309 09/17/24 0214  HGB 12.5 11.0*  --   --  9.2* 7.3*  --   HCT 40.4 33.1*  --   --  27.0* 22.7*  --   PLT 197 156  --   --   --  96* 106*  APTT  --   --   --   --   --   --  PENDING  LABPROT 12.8  --   --   --   --   --  PENDING  INR 0.9  --   --   --   --   --  PENDING  HEPARINUNFRC  --   --  >1.10* >1.10*  --   --  1.04*  CREATININE 1.35* 1.43*  --   --   --   --   --     Estimated Creatinine Clearance: 29.3 mL/min (A) (by C-G formula based on SCr of 1.43 mg/dL (H)).   Medical History: Past Medical History:  Diagnosis Date   Anemia    was while doing chemo   Anxiety    Arthritis    Asthma    Chemotherapy-induced neuropathy 11/01/2015   CHF (congestive heart failure) (HCC)    Claustrophobia    COPD (chronic obstructive pulmonary disease) (HCC)    Depression    Encounter for antineoplastic chemotherapy 02/09/2016   GERD (gastroesophageal reflux disease)    Headache    prior to menopause   Heart murmur    History of echocardiogram    Echo 2/17: EF  60-65%, grade 1 diastolic dysfunction, mild MR, trivial pericardial effusion   History of nuclear stress test    Myoview 2/17: no ischemia or scar, EF 79%; low risk   History of radiation therapy 10/30/16-11/06/16   Hyperlipidemia    Hypertension    Insomnia 05/16/2016   Myocardial infarction (HCC)    Non-small cell carcinoma of right lung, stage 2 (HCC) 10/03/2015   Oxygen  deficiency    Pneumonia    Radiation 02/29/16-04/10/16   50.4 Gy to right central chest   Renal cell carcinoma (HCC)    L nephrectomy  in 2012    Assessment: 83 yo F with new PE on CTA: multiple bilateral PEs with evidence of R heart strain.  No anticoagulants PTA CBC WNL SCr 1.35  09/17/24 Heparin  level = 1.04 (supratherapeutic) with heparin  gtt @ 700 units/hr (level from central line, heparin  running in arm). Bleeding noted earlier from around femoral arterial line.  Currently RN reports slow oozing under dressing  in the area.  Hgb down 7.3 (11/5 @ 23:09), PLTC 106 (11/6 @ 0214).  Patient currently receiving 2units PRBC  Goal of Therapy:  Heparin  level 0.3-0.7 units/ml Monitor platelets by anticoagulation protocol: Yes   Plan:  Hold heparin  x 1 hr Restart heparin  at decreased rate of 500 units/hr Check 8 hour heparin  level after heparin  resumed Daily CBC & heparin  level   Arvin Gauss, PharmD 09/17/2024 4:06 AM

## 2024-09-17 NOTE — Progress Notes (Signed)
   09/17/24 0800  Provider Notification  Provider Name/Title Dr. Jennet  Date Provider Notified 09/17/24  Time Provider Notified [phone]  Method of Notification Virtual Face-to-face  Notification Reason Critical Result  Test performed and critical result lactic 2.9  Date Critical Result Received 09/17/24  Time Critical Result Received 0830  Provider response Other (Comment) (gradually putting in new orders for patient)  Date of Provider Response 09/17/24  Time of Provider Response 0830

## 2024-09-17 NOTE — Progress Notes (Signed)
 C/f possible hemorrhagic shock unclear source. Bleeding from art line site overnight, hgb drop to 7.3 no hematoma r fem. + overlying ecchymosis. Rcving PRBC now    CT angio abd and CT angio ext were ordered. I d/w rad tach -- we acknowledge previous Cr/gf. In this situation benefit of obtaining imaging significantly outweighs risk    Ronnald Gave MSN, AGACNP-BC [facility] Pulmonary/Critical Care Medicine 09/17/2024, 7:42 AM

## 2024-09-17 NOTE — Progress Notes (Signed)
 Physical Therapy Discharge Patient Details Name: Tammy Ball MRN: [id number] DOB: 11/12/1941 Today's Date: 09/17/2024 Time:  -     Patient discharged from PT services secondary to medical  status, on vent- will need to re-order PT to resume therapy services.  Darice Potters PT Acute Rehabilitation Services Office [phone]  GP     Potters Darice Norris 09/17/2024, 7:36 AM

## 2024-09-18 ENCOUNTER — Inpatient Hospital Stay (HOSPITAL_COMMUNITY)

## 2024-09-18 DIAGNOSIS — R109 Unspecified abdominal pain: Secondary | ICD-10-CM | POA: Diagnosis not present

## 2024-09-18 DIAGNOSIS — A419 Sepsis, unspecified organism: Secondary | ICD-10-CM | POA: Diagnosis not present

## 2024-09-18 DIAGNOSIS — J449 Chronic obstructive pulmonary disease, unspecified: Secondary | ICD-10-CM

## 2024-09-18 DIAGNOSIS — E785 Hyperlipidemia, unspecified: Secondary | ICD-10-CM

## 2024-09-18 DIAGNOSIS — I2699 Other pulmonary embolism without acute cor pulmonale: Secondary | ICD-10-CM

## 2024-09-18 DIAGNOSIS — R6521 Severe sepsis with septic shock: Secondary | ICD-10-CM | POA: Diagnosis not present

## 2024-09-18 DIAGNOSIS — I10 Essential (primary) hypertension: Secondary | ICD-10-CM

## 2024-09-18 DIAGNOSIS — I724 Aneurysm of artery of lower extremity: Secondary | ICD-10-CM

## 2024-09-18 DIAGNOSIS — Z4682 Encounter for fitting and adjustment of non-vascular catheter: Secondary | ICD-10-CM | POA: Diagnosis not present

## 2024-09-18 DIAGNOSIS — J9621 Acute and chronic respiratory failure with hypoxia: Secondary | ICD-10-CM | POA: Diagnosis not present

## 2024-09-18 LAB — BASIC METABOLIC PANEL WITH GFR
Anion gap: 9 (ref 5–15)
BUN: 37 mg/dL — ABNORMAL HIGH (ref 8–23)
CO2: 29 mmol/L (ref 22–32)
Calcium: 8.3 mg/dL — ABNORMAL LOW (ref 8.9–10.3)
Chloride: 98 mmol/L (ref 98–111)
Creatinine, Ser: 1.46 mg/dL — ABNORMAL HIGH (ref 0.44–1.00)
GFR, Estimated: 36 mL/min — ABNORMAL LOW (ref >60)
Glucose, Bld: 130 mg/dL — ABNORMAL HIGH (ref 70–99)
Potassium: 3.3 mmol/L — ABNORMAL LOW (ref 3.5–5.1)
Sodium: 136 mmol/L (ref 135–145)

## 2024-09-18 LAB — GLUCOSE, CAPILLARY
Glucose-Capillary: 108 mg/dL — ABNORMAL HIGH (ref 70–99)
Glucose-Capillary: 128 mg/dL — ABNORMAL HIGH (ref 70–99)
Glucose-Capillary: 111 mg/dL — ABNORMAL HIGH (ref 70–99)
Glucose-Capillary: 130 mg/dL — ABNORMAL HIGH (ref 70–99)
Glucose-Capillary: 113 mg/dL — ABNORMAL HIGH (ref 70–99)

## 2024-09-18 LAB — CBC
HCT: 29.6 % — ABNORMAL LOW (ref 36.0–46.0)
Hemoglobin: 10 g/dL — ABNORMAL LOW (ref 12.0–15.0)
MCH: 30.6 pg (ref 26.0–34.0)
MCHC: 33.8 g/dL (ref 30.0–36.0)
MCV: 90.5 fL (ref 80.0–100.0)
Platelets: 68 K/uL — ABNORMAL LOW (ref 150–400)
RBC: 3.27 MIL/uL — ABNORMAL LOW (ref 3.87–5.11)
RDW: 16.6 % — ABNORMAL HIGH (ref 11.5–15.5)
WBC: 8.8 K/uL (ref 4.0–10.5)
nRBC: 0 % (ref 0.0–0.2)

## 2024-09-18 LAB — TECHNOLOGIST SMEAR REVIEW: Plt Morphology: NORMAL

## 2024-09-18 LAB — DIC (DISSEMINATED INTRAVASCULAR COAGULATION)PANEL
D-Dimer, Quant: 2.38 ug{FEU}/mL — ABNORMAL HIGH (ref 0.00–0.50)
Fibrinogen: 499 mg/dL — ABNORMAL HIGH (ref 210–475)
INR: 1 (ref 0.8–1.2)
Platelets: 68 K/uL — ABNORMAL LOW (ref 150–400)
Prothrombin Time: 13.6 s (ref 11.4–15.2)
Smear Review: NONE SEEN
aPTT: 29 s (ref 24–36)

## 2024-09-18 LAB — HEPARIN LEVEL (UNFRACTIONATED): Heparin Unfractionated: 0.31 [IU]/mL (ref 0.30–0.70)

## 2024-09-18 MED ORDER — HEPARIN (PORCINE) 25000 UT/250ML-% IV SOLN
800.0000 [IU]/h | INTRAVENOUS | Status: DC
  Administered 2024-09-18: 700 [IU]/h via INTRAVENOUS
  Administered 2024-09-19: 800 [IU]/h via INTRAVENOUS
  Filled 2024-09-18: qty 250

## 2024-09-18 MED ORDER — ALBUTEROL SULFATE (2.5 MG/3ML) 0.083% IN NEBU
2.5000 mg | INHALATION_SOLUTION | RESPIRATORY_TRACT | Status: DC | PRN
  Administered 2024-09-19: 2.5 mg via RESPIRATORY_TRACT
  Filled 2024-09-18: qty 3

## 2024-09-18 MED ORDER — CYCLOBENZAPRINE HCL 5 MG PO TABS
5.0000 mg | ORAL_TABLET | Freq: Every day | ORAL | Status: DC | PRN
  Administered 2024-09-18 – 2024-09-19 (×2): 5 mg
  Filled 2024-09-18 (×2): qty 1

## 2024-09-18 NOTE — Progress Notes (Signed)
 PHARMACY - ANTICOAGULATION CONSULT NOTE  Pharmacy Consult for Heparin  Indication: pulmonary embolus  Allergies  Allergen Reactions   Bee Venom Anaphylaxis, Shortness Of Breath, Swelling and Other (See Comments)    Swelling at site (reaction to bees and wasps)   Amlodipine  Swelling and Other (See Comments)    Swelling of the ankles and hands    Levofloxacin Other (See Comments)    Joint pain    Alendronate  Other (See Comments)    Joint pains / hypercalcemia    Hctz [Hydrochlorothiazide ] Palpitations and Other (See Comments)    Sweating     Patient Measurements: Height: 5' 3 (160 cm) Weight: 63.3 kg (139 lb 8.8 oz) IBW/kg (Calculated) : 52.4 HEPARIN  DW (KG): 67.1  Vital Signs: Temp: 98.8 F (37.1 C) (11/07 0615) BP: 122/54 (11/07 0615) Pulse Rate: 79 (11/07 0615)  Labs: Recent Labs    09/16/24 0818 09/16/24 1659 09/16/24 2216 09/17/24 0214 09/17/24 0744 09/17/24 1037 09/17/24 1434 09/17/24 1712 09/17/24 1936 09/18/24 0433 09/18/24 0816  HGB  --   --    < >  --  10.6*   < > 10.8*  --  10.9* 10.0*  --   HCT  --   --    < >  --  31.6*   < > 32.2*  --  31.8* 29.6*  --   PLT  --   --    < > 106* 80*  79*  --  82*  --  85* 68* 68*  APTT  --   --   --  >200* 76*  --   --   --   --   --  PENDING  LABPROT  --   --   --  16.9* 14.3  --   --   --   --   --  PENDING  INR  --   --   --  1.3* 1.0  --   --   --   --   --  PENDING  HEPARINUNFRC >1.10* >1.10*  --  1.04*  --   --   --   --   --   --   --   CREATININE  --   --   --   --  1.60*  --   --  1.41*  --   --  1.46*   < > = values in this interval not displayed.    Estimated Creatinine Clearance: 27.1 mL/min (A) (by C-G formula based on SCr of 1.46 mg/dL (H)).   Medical History: Past Medical History:  Diagnosis Date   Anemia    was while doing chemo   Anxiety    Arthritis    Asthma    Chemotherapy-induced neuropathy 11/01/2015   CHF (congestive heart failure) (HCC)    Claustrophobia    COPD (chronic  obstructive pulmonary disease) (HCC)    Depression    Encounter for antineoplastic chemotherapy 02/09/2016   GERD (gastroesophageal reflux disease)    Headache    prior to menopause   Heart murmur    History of echocardiogram    Echo 2/17: EF 60-65%, grade 1 diastolic dysfunction, mild MR, trivial pericardial effusion   History of nuclear stress test    Myoview 2/17: no ischemia or scar, EF 79%; low risk   History of radiation therapy 10/30/16-11/06/16   Hyperlipidemia    Hypertension    Insomnia 05/16/2016   Myocardial infarction (HCC)    Non-small cell carcinoma of right lung, stage 2 (HCC)  10/03/2015   Oxygen  deficiency    Pneumonia    Radiation 02/29/16-04/10/16   50.4 Gy to right central chest   Renal cell carcinoma (HCC)    L nephrectomy  in 2012    Assessment:  AC/Heme: UFH for B PE w/ R heart strain, ddimer 11.66. +Afib - 11/6 Heparin  held for anemia, bleeding from Aline R groin. CT angio GIB w c/f possible pseudoaneurysm and some bleeding from arterial access. D/c Aline - 11/7: Hgb 10 down, Plts 68 down. Groin hematoma improved per MD. Resume Heparin  at last rate of 700 units/hr  Goal of Therapy:  Heparin  level 0.3-0.7 units/ml Monitor platelets by anticoagulation protocol: Yes   Plan:  Resume IV heparin , no bolus at previous rate of 700 units/hr Check heparin  level in 8 hrs Daily heparin  level and CBC   Akeya Ryther Karoline Marina, PharmD, BCPS Clinical Staff Pharmacist Marina Salines [place] 09/18/2024,9:38 AM

## 2024-09-18 NOTE — Progress Notes (Signed)
 VASCULAR LAB    Duplex of right groin has been performed.  See CV proc for preliminary results.   Luma Clopper, RVT 09/18/2024, 1:10 PM

## 2024-09-18 NOTE — Progress Notes (Signed)
 PHARMACY - ANTICOAGULATION CONSULT NOTE  Pharmacy Consult for Heparin  Indication: pulmonary embolus  Allergies  Allergen Reactions   Bee Venom Anaphylaxis, Shortness Of Breath, Swelling and Other (See Comments)    Swelling at site (reaction to bees and wasps)   Amlodipine  Swelling and Other (See Comments)    Swelling of the ankles and hands    Levofloxacin Other (See Comments)    Joint pain    Alendronate  Other (See Comments)    Joint pains / hypercalcemia    Hctz [Hydrochlorothiazide ] Palpitations and Other (See Comments)    Sweating     Patient Measurements: Height: 5' 3 (160 cm) Weight: 63.3 kg (139 lb 8.8 oz) IBW/kg (Calculated) : 52.4 HEPARIN  DW (KG): 67.1  Vital Signs: Temp: 97.7 F (36.5 C) (11/07 1800) BP: 151/72 (11/07 1800) Pulse Rate: 69 (11/07 1800)  Labs: Recent Labs    09/16/24 1659 09/16/24 2216 09/17/24 0214 09/17/24 0744 09/17/24 1037 09/17/24 1434 09/17/24 1712 09/17/24 1936 09/18/24 0433 09/18/24 0816 09/18/24 1812  HGB  --    < >  --  10.6*   < > 10.8*  --  10.9* 10.0*  --   --   HCT  --    < >  --  31.6*   < > 32.2*  --  31.8* 29.6*  --   --   PLT  --    < > 106* 80*  79*  --  82*  --  85* 68* 68*  --   APTT  --   --  >200* 76*  --   --   --   --   --  29  --   LABPROT  --   --  16.9* 14.3  --   --   --   --   --  13.6  --   INR  --   --  1.3* 1.0  --   --   --   --   --  1.0  --   HEPARINUNFRC >1.10*  --  1.04*  --   --   --   --   --   --   --  0.31  CREATININE  --   --   --  1.60*  --   --  1.41*  --   --  1.46*  --    < > = values in this interval not displayed.    Estimated Creatinine Clearance: 27.1 mL/min (A) (by C-G formula based on SCr of 1.46 mg/dL (H)).   Medical History: Past Medical History:  Diagnosis Date   Anemia    was while doing chemo   Anxiety    Arthritis    Asthma    Chemotherapy-induced neuropathy 11/01/2015   CHF (congestive heart failure) (HCC)    Claustrophobia    COPD (chronic obstructive pulmonary  disease) (HCC)    Depression    Encounter for antineoplastic chemotherapy 02/09/2016   GERD (gastroesophageal reflux disease)    Headache    prior to menopause   Heart murmur    History of echocardiogram    Echo 2/17: EF 60-65%, grade 1 diastolic dysfunction, mild MR, trivial pericardial effusion   History of nuclear stress test    Myoview 2/17: no ischemia or scar, EF 79%; low risk   History of radiation therapy 10/30/16-11/06/16   Hyperlipidemia    Hypertension    Insomnia 05/16/2016   Myocardial infarction (HCC)    Non-small cell carcinoma of right lung, stage 2 (HCC)  10/03/2015   Oxygen  deficiency    Pneumonia    Radiation 02/29/16-04/10/16   50.4 Gy to right central chest   Renal cell carcinoma (HCC)    L nephrectomy  in 2012    Assessment:  AC/Heme: UFH for B PE w/ R heart strain, ddimer 11.66. +Afib - 11/6 Heparin  held for anemia, bleeding from Aline R groin. CT angio GIB w c/f possible pseudoaneurysm and some bleeding from arterial access. D/c Aline - 11/7: Hgb 10 down, Plts 68 down. Groin hematoma improved per MD. Resume Heparin  at last rate of 700 units/hr  Today, -Heparin  level 0.31 - therapeutic with heparin  infusing at 700 units/hr -Plt trending down -No complications of therapy noted  Goal of Therapy:  Heparin  level 0.3-0.7 units/ml Monitor platelets by anticoagulation protocol: Yes   Plan:  -Continue heparin  infusion at 700 units/hr -Daily heparin  level and CBC -Continue to monitor closely   Stefano MARLA Bologna, PharmD, BCPS Clinical Pharmacist 09/18/2024 7:13 PM

## 2024-09-18 NOTE — Progress Notes (Signed)
 NAME:  Tammy Ball, MRN:  [id number], DOB:  [birth year], LOS: 3 ADMISSION DATE:  09/15/2024, CONSULTATION DATE:  11/4 REFERRING MD:  Dr. Randol, CHIEF COMPLAINT:  septic shock; pna; PE   History of Present Illness:  Patient is a 83 yo F w/ pertinent PMH lung cancer s/p lobectomy and cehmo, reneal cell carcinoma w/ left nephrectomy, diastolic chf, COPD on 2L [place] at home, thoracic spinal fracture involving T7, htn, hld, cad presents to [facility] on 11/4 w/ left sided chest pain.  Patient admitted last month for back. Imaging noted T7 compression spinal fracture. While in hospital patient developed worsening respiratory failure thought 2/2 to hypoventilation/atelectasis from severe pain requiring bipap. Completed abx for possible pna. IR performed kyphoplasty on 10/21 w/ improvement. Discharged to SNF for ongoing therapy.  On 11/3 patient developed cough w/ blood tinged sputum. On 11/4 patient woke up w/ acute left sided chest/back pain w/ sob. States she felt like she had fever and bodyaches. O2 had to be increased from 2 to 4L at facility. Brought to [facility] ED. On arrival patient temp 99.3 F and wbc 10. LA 1.9. BS w/ rhonchi/rales. BP soft. CXR w/ increased left basilar opacities and small left pleural effusion. Cultures obtained, given fluids, and started on rocephin /azithro. UA pending. D-dimer elevated 11.66 and trop 93. CTA chest showing multiple b/l segmental PE w/ evidence of R heart strain; RLL pleural based opacification; moderate consolidation over LLL w/ small left pleural effusion. Heparin  started. Despite iv fluids patient remained hypotensive requiring levo. Pccm consulted.  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   CHF (congestive heart failure) (HCC)    Claustrophobia    COPD (chronic obstructive pulmonary disease) (HCC)    Depression    Encounter  for antineoplastic chemotherapy 02/09/2016   GERD (gastroesophageal reflux disease)    Headache    prior to menopause   Heart murmur    History of echocardiogram    Echo 2/17: EF 60-65%, grade 1 diastolic dysfunction, mild MR, trivial pericardial effusion   History of nuclear stress test    Myoview 2/17: no ischemia or scar, EF 79%; low risk   History of radiation therapy 10/30/16-11/06/16   Hyperlipidemia    Hypertension    Insomnia 05/16/2016   Myocardial infarction (HCC)    Non-small cell carcinoma of right lung, stage 2 (HCC) 10/03/2015   Oxygen  deficiency    Pneumonia    Radiation 02/29/16-04/10/16   50.4 Gy to right central chest   Renal cell carcinoma (HCC)    L nephrectomy  in 2012     Significant Hospital Events: Including procedures, antibiotic start and stop dates in addition to other pertinent events   11/4 admit w/ pna and pe on pressors 11/4 intubated, central venous access, on pressors-Levophed, on epoprostenol, 11/5 art line 11/6 oozig from art line, labile blood pressures and HR. C/f hemorrhagic shock w hgb 7.3 getting 2 PRBC     Interim History / Subjective:  Alert and oriented on the vent.  Answering questions.  Denies any symptoms.  Tender to palpation and belly in the upper abdominal quadrants.  Objective    Blood pressure (!) 122/54, pulse 79, temperature 98.8 F (37.1 C), resp. rate 20, height 5' 3 (1.6 m), weight 63.3 kg, SpO2 96%. CVP:  [10 mmHg-22 mmHg] 13  mmHg  Vent Mode: PRVC FiO2 (%):  [40 %-50 %] 50 % Set Rate:  [20 bmp] 20 bmp Vt Set:  [410 mL] 410 mL PEEP:  [10 cmH20-12 cmH20] 10 cmH20 Plateau Pressure:  [20 cmH20-24 cmH20] 20 cmH20   Intake/Output Summary (Last 24 hours) at 09/18/2024 0954 Last data filed at 09/18/2024 9373 Gross per 24 hour  Intake 2376.28 ml  Output 800 ml  Net 1576.28 ml   Filed Weights   09/16/24 0500 09/17/24 0500 09/18/24 0500  Weight: 69.3 kg 79.2 kg 63.3 kg    Examination: General: Elderly lady who is  currently intubated.  Not sedated and following commands. Lungs: clear to auscultation bilaterally.  Heart: regular rate rhythm, no murmur appreciated.  Abdomen: Sounds normal but tender to palpation in upper abdominal quadrants. Neuro: Alert and oriented.  Following commands.  Writing on board to convey. Right groin site without significant hematoma.   Resolved problem list   Assessment and Plan    Septic shock Acinetobacter bacteremia PNA -On Unasyn.  Tobramycin  changed to minocycline per ID. -awaiting sensitivities. - Off norepinephrine.  On low-dose vaso which will be able to wean.  On stress dose steroids.  Acute on chronic resp failure w hypoxia  PE pHTN Hx lung ca s/p RUL lobectomy  COPD on 2 L at home -Continue LT VV. - Vent bundle. - Weaning on vent settings.  Likely SBT> extubation today. -Was started on epoprostenol in the hospital which has been discontinued due to low blood pressure. - Heparin  drip has been resumed today. - Continue nebs.   AKI  Hx RCC s/p L nephrectomy  -Creatinine improving. - Monitor.  Anemia-stable likely blood loss. Thrombocytopenia -Drop in hemoglobin thought secondary to bleeding from arterial line site.  Art line discontinued status post pressure. - CT abdomen with concern for right groin pseudoaneurysm.  - PTT, INR normal.  Fibrinogen elevated.  No schistocytes seen.  TTP, DIC less likely.  Platelets down since admission.  Less likely HIT.  4T score 4. P -HIT antibody. - Monitor platelets and hemoglobin. - Right groin ultrasound if needed. I have asked vascular surgery for input regarding for the procedure.  New Afib/flutter, variable rate  AoC Diastolic HF HTN HLD  P -home meds on hold acutely -heparin  currently on hold w bleeding concerns  Thoracic compression fxs Recent T7 kyphoplasty 10/21 w IR  -no acute intervention  - Will need PT and placement close to discharge.  Labs   CBC: Recent Labs  Lab 09/16/24 2309  09/17/24 0214 09/17/24 0744 09/17/24 1037 09/17/24 1434 09/17/24 1936 09/18/24 0433 09/18/24 0816  WBC 8.2  --  9.2  --  9.2 10.0 8.8  --   HGB 7.3*  --  10.6* 9.9* 10.8* 10.9* 10.0*  --   HCT 22.7*  --  31.6* 28.8* 32.2* 31.8* 29.6*  --   MCV 95.4  --  92.1  --  90.7 90.3 90.5  --   PLT 96*   < > 80*  79*  --  82* 85* 68* 68*   < > = values in this interval not displayed.    Basic Metabolic Panel: Recent Labs  Lab 09/15/24 1008 09/16/24 0440 09/16/24 2216 09/17/24 0744 09/17/24 1712 09/18/24 0816  NA 138 134* 135 133* 135 136  K 4.3 4.0 4.2 4.9 3.5 3.3*  CL 90* 94*  --  96* 99 98  CO2 38* 31  --  27 26 29   GLUCOSE 101* 141*  --  170*  139* 130*  BUN 33* 34*  --  33* 32* 37*  CREATININE 1.35* 1.43*  --  1.60* 1.41* 1.46*  CALCIUM  10.1 8.3*  --  8.3* 8.2* 8.3*   GFR: Estimated Creatinine Clearance: 27.1 mL/min (A) (by C-G formula based on SCr of 1.46 mg/dL (H)). Recent Labs  Lab 09/15/24 1007 09/15/24 1008 09/15/24 1253 09/15/24 2210 09/16/24 0440 09/17/24 0744 09/17/24 1434 09/17/24 1936 09/18/24 0433  WBC  --    < >  --   --    < > 9.2 9.2 10.0 8.8  LATICACIDVEN 1.9  --  11.1* 1.3  --  2.9*  --   --   --    < > = values in this interval not displayed.    Liver Function Tests: Recent Labs  Lab 09/16/24 0440 09/17/24 0744  AST 23 14*  ALT 13 10  ALKPHOS 60 54  BILITOT 0.9 0.9  PROT 4.9* 4.2*  ALBUMIN  2.9* 2.2*   No results for input(s): LIPASE, AMYLASE in the last 168 hours. No results for input(s): AMMONIA in the last 168 hours.  ABG    Component Value Date/Time   PHART 7.38 09/17/2024 1210   PCO2ART 47 09/17/2024 1210   PO2ART 98 09/17/2024 1210   HCO3 27.8 09/17/2024 1210   TCO2 30 09/16/2024 2216   ACIDBASEDEF 1.0 01/18/2016 1120   O2SAT 99.2 09/17/2024 1210     Coagulation Profile: Recent Labs  Lab 09/15/24 1008 09/17/24 0214 09/17/24 0744 09/18/24 0816  INR 0.9 1.3* 1.0 1.0    Cardiac Enzymes: No results for  input(s): CKTOTAL, CKMB, CKMBINDEX, TROPONINI in the last 168 hours.  HbA1C: Hgb A1c MFr Bld  Date/Time Value Ref Range Status  09/16/2024 04:40 AM 5.8 (H) 4.8 - 5.6 % Final    Comment:    (NOTE) Diagnosis of Diabetes The following HbA1c ranges recommended by the American Diabetes Association (ADA) may be used as an aid in the diagnosis of diabetes mellitus.  Hemoglobin             Suggested A1C NGSP%              Diagnosis  <5.7                   Non Diabetic  5.7-6.4                Pre-Diabetic  >6.4                   Diabetic  <7.0                   Glycemic control for                       adults with diabetes.      CBG: Recent Labs  Lab 09/17/24 1555 09/17/24 1939 09/17/24 2327 09/18/24 0429 09/18/24 0734  GLUCAP 130* 151* 155* 108* 128*   CRITICAL CARE Performed by: Sammi JONETTA Fredericks.     Total critical care time: 50 minutes   Critical care time was exclusive of separately billable procedures and treating other patients.   Critical care was necessary to treat or prevent imminent or life-threatening deterioration.   Critical care was time spent personally by me on the following activities: development of treatment plan with patient and/or surrogate as well as nursing, discussions with consultants, evaluation of patient's response to treatment, examination of patient, obtaining history from patient or surrogate, ordering and  performing treatments and interventions, ordering and review of laboratory studies, ordering and review of radiographic studies, pulse oximetry, re-evaluation of patient's condition and participation in multidisciplinary rounds.  Sammi JONETTA Fredericks, MD Pulmonary, Critical Care and Sleep Attending.  Pager: [phone]  09/18/2024, 10:10 AM

## 2024-09-19 ENCOUNTER — Inpatient Hospital Stay (HOSPITAL_COMMUNITY)

## 2024-09-19 DIAGNOSIS — Z452 Encounter for adjustment and management of vascular access device: Secondary | ICD-10-CM | POA: Diagnosis not present

## 2024-09-19 DIAGNOSIS — J90 Pleural effusion, not elsewhere classified: Secondary | ICD-10-CM | POA: Diagnosis not present

## 2024-09-19 DIAGNOSIS — A419 Sepsis, unspecified organism: Secondary | ICD-10-CM | POA: Diagnosis not present

## 2024-09-19 DIAGNOSIS — R6521 Severe sepsis with septic shock: Secondary | ICD-10-CM | POA: Diagnosis not present

## 2024-09-19 DIAGNOSIS — J439 Emphysema, unspecified: Secondary | ICD-10-CM | POA: Diagnosis not present

## 2024-09-19 DIAGNOSIS — I2699 Other pulmonary embolism without acute cor pulmonale: Secondary | ICD-10-CM | POA: Diagnosis not present

## 2024-09-19 DIAGNOSIS — J9621 Acute and chronic respiratory failure with hypoxia: Secondary | ICD-10-CM | POA: Diagnosis not present

## 2024-09-19 DIAGNOSIS — J449 Chronic obstructive pulmonary disease, unspecified: Secondary | ICD-10-CM | POA: Diagnosis not present

## 2024-09-19 DIAGNOSIS — R079 Chest pain, unspecified: Secondary | ICD-10-CM | POA: Diagnosis not present

## 2024-09-19 DIAGNOSIS — R918 Other nonspecific abnormal finding of lung field: Secondary | ICD-10-CM | POA: Diagnosis not present

## 2024-09-19 LAB — COMPREHENSIVE METABOLIC PANEL WITH GFR
ALT: 14 U/L (ref 0–44)
AST: 19 U/L (ref 15–41)
Albumin: 2.8 g/dL — ABNORMAL LOW (ref 3.5–5.0)
Alkaline Phosphatase: 75 U/L (ref 38–126)
Anion gap: 11 (ref 5–15)
BUN: 35 mg/dL — ABNORMAL HIGH (ref 8–23)
CO2: 30 mmol/L (ref 22–32)
Calcium: 8.7 mg/dL — ABNORMAL LOW (ref 8.9–10.3)
Chloride: 99 mmol/L (ref 98–111)
Creatinine, Ser: 1.39 mg/dL — ABNORMAL HIGH (ref 0.44–1.00)
GFR, Estimated: 38 mL/min — ABNORMAL LOW (ref >60)
Glucose, Bld: 121 mg/dL — ABNORMAL HIGH (ref 70–99)
Potassium: 2.9 mmol/L — ABNORMAL LOW (ref 3.5–5.1)
Sodium: 140 mmol/L (ref 135–145)
Total Bilirubin: 0.4 mg/dL (ref 0.0–1.2)
Total Protein: 5.4 g/dL — ABNORMAL LOW (ref 6.5–8.1)

## 2024-09-19 LAB — LIPASE, BLOOD: Lipase: 38 U/L (ref 11–51)

## 2024-09-19 LAB — CBC
HCT: 28.3 % — ABNORMAL LOW (ref 36.0–46.0)
Hemoglobin: 9.8 g/dL — ABNORMAL LOW (ref 12.0–15.0)
MCH: 31.4 pg (ref 26.0–34.0)
MCHC: 34.6 g/dL (ref 30.0–36.0)
MCV: 90.7 fL (ref 80.0–100.0)
Platelets: 72 K/uL — ABNORMAL LOW (ref 150–400)
RBC: 3.12 MIL/uL — ABNORMAL LOW (ref 3.87–5.11)
RDW: 16.3 % — ABNORMAL HIGH (ref 11.5–15.5)
WBC: 10.6 K/uL — ABNORMAL HIGH (ref 4.0–10.5)
nRBC: 0 % (ref 0.0–0.2)

## 2024-09-19 LAB — GLUCOSE, CAPILLARY
Glucose-Capillary: 100 mg/dL — ABNORMAL HIGH (ref 70–99)
Glucose-Capillary: 105 mg/dL — ABNORMAL HIGH (ref 70–99)
Glucose-Capillary: 105 mg/dL — ABNORMAL HIGH (ref 70–99)
Glucose-Capillary: 106 mg/dL — ABNORMAL HIGH (ref 70–99)
Glucose-Capillary: 110 mg/dL — ABNORMAL HIGH (ref 70–99)
Glucose-Capillary: 114 mg/dL — ABNORMAL HIGH (ref 70–99)
Glucose-Capillary: 126 mg/dL — ABNORMAL HIGH (ref 70–99)

## 2024-09-19 LAB — HEPARIN LEVEL (UNFRACTIONATED)
Heparin Unfractionated: 0.26 [IU]/mL — ABNORMAL LOW (ref 0.30–0.70)
Heparin Unfractionated: 0.53 [IU]/mL (ref 0.30–0.70)

## 2024-09-19 LAB — TROPONIN T, HIGH SENSITIVITY
Troponin T High Sensitivity: 103 ng/L (ref 0–19)
Troponin T High Sensitivity: 106 ng/L (ref 0–19)

## 2024-09-19 LAB — MAGNESIUM: Magnesium: 2.3 mg/dL (ref 1.7–2.4)

## 2024-09-19 MED ORDER — FENTANYL CITRATE (PF) 50 MCG/ML IJ SOSY: 50.0000 ug | PREFILLED_SYRINGE | Freq: Once | INTRAMUSCULAR | Status: AC

## 2024-09-19 MED ORDER — NITROGLYCERIN 0.4 MG SL SUBL
0.4000 mg | SUBLINGUAL_TABLET | SUBLINGUAL | Status: AC | PRN
  Administered 2024-09-19 (×2): 0.4 mg via SUBLINGUAL
  Filled 2024-09-19: qty 1

## 2024-09-19 MED ORDER — METOPROLOL TARTRATE 5 MG/5ML IV SOLN: 5.0000 mg | Freq: Once | INTRAVENOUS | Status: DC | PRN

## 2024-09-19 MED ORDER — IOHEXOL 350 MG/ML SOLN
80.0000 mL | Freq: Once | INTRAVENOUS | Status: AC | PRN
  Administered 2024-09-19: 75 mL via INTRAVENOUS

## 2024-09-19 MED ORDER — HEPARIN (PORCINE) 25000 UT/250ML-% IV SOLN
800.0000 [IU]/h | INTRAVENOUS | Status: DC
  Administered 2024-09-19: 800 [IU]/h via INTRAVENOUS

## 2024-09-19 MED ORDER — METOPROLOL TARTRATE 25 MG PO TABS
25.0000 mg | ORAL_TABLET | Freq: Two times a day (BID) | ORAL | Status: DC
  Administered 2024-09-19 – 2024-09-23 (×7): 25 mg via ORAL
  Filled 2024-09-19 (×7): qty 1

## 2024-09-19 MED ORDER — AMIODARONE HCL IN DEXTROSE 360-4.14 MG/200ML-% IV SOLN
30.0000 mg/h | INTRAVENOUS | Status: DC
  Administered 2024-09-19 – 2024-09-20 (×2): 30 mg/h via INTRAVENOUS
  Filled 2024-09-19 (×2): qty 200

## 2024-09-19 MED ORDER — HYDROCORTISONE SOD SUC (PF) 100 MG IJ SOLR
50.0000 mg | Freq: Three times a day (TID) | INTRAMUSCULAR | Status: DC
  Administered 2024-09-19 – 2024-09-20 (×3): 50 mg via INTRAVENOUS
  Filled 2024-09-19 (×3): qty 2

## 2024-09-19 MED ORDER — LABETALOL HCL 5 MG/ML IV SOLN
10.0000 mg | Freq: Once | INTRAVENOUS | Status: AC
  Administered 2024-09-19: 10 mg via INTRAVENOUS
  Filled 2024-09-19: qty 4

## 2024-09-19 MED ORDER — LABETALOL HCL 5 MG/ML IV SOLN
10.0000 mg | INTRAVENOUS | Status: DC | PRN
  Administered 2024-09-19 (×2): 10 mg via INTRAVENOUS
  Filled 2024-09-19 (×2): qty 4

## 2024-09-19 MED ORDER — POTASSIUM CHLORIDE 20 MEQ PO PACK
40.0000 meq | PACK | ORAL | Status: AC
  Administered 2024-09-19 (×2): 40 meq via ORAL
  Filled 2024-09-19 (×2): qty 2

## 2024-09-19 MED ORDER — HYDROXYZINE HCL 10 MG PO TABS
10.0000 mg | ORAL_TABLET | Freq: Three times a day (TID) | ORAL | Status: DC | PRN
  Administered 2024-09-19 – 2024-09-22 (×4): 10 mg via ORAL
  Filled 2024-09-19 (×4): qty 1

## 2024-09-19 MED ORDER — AMIODARONE HCL IN DEXTROSE 360-4.14 MG/200ML-% IV SOLN
60.0000 mg/h | INTRAVENOUS | Status: AC
  Administered 2024-09-19 (×2): 60 mg/h via INTRAVENOUS
  Filled 2024-09-19: qty 200

## 2024-09-19 MED ORDER — FENTANYL CITRATE (PF) 50 MCG/ML IJ SOSY
50.0000 ug | PREFILLED_SYRINGE | Freq: Once | INTRAMUSCULAR | Status: AC
  Administered 2024-09-19: 50 ug via INTRAVENOUS
  Filled 2024-09-19: qty 1

## 2024-09-19 MED ORDER — POTASSIUM CHLORIDE 10 MEQ/100ML IV SOLN
10.0000 meq | INTRAVENOUS | Status: AC
  Administered 2024-09-19 (×3): 10 meq via INTRAVENOUS
  Filled 2024-09-19 (×3): qty 100

## 2024-09-19 MED ORDER — NITROGLYCERIN 0.4 MG SL SUBL
SUBLINGUAL_TABLET | SUBLINGUAL | Status: AC
  Administered 2024-09-19: 0.4 mg via SUBLINGUAL
  Filled 2024-09-19: qty 1

## 2024-09-19 MED ORDER — AMIODARONE LOAD VIA INFUSION
150.0000 mg | Freq: Once | INTRAVENOUS | Status: AC
  Administered 2024-09-19: 150 mg via INTRAVENOUS
  Filled 2024-09-19: qty 83.34

## 2024-09-19 MED ORDER — OXYCODONE HCL 5 MG PO TABS
5.0000 mg | ORAL_TABLET | ORAL | Status: DC | PRN
  Administered 2024-09-19 – 2024-09-23 (×6): 5 mg via ORAL
  Filled 2024-09-19 (×6): qty 1

## 2024-09-19 NOTE — Progress Notes (Addendum)
 eLink Physician-Brief Progress Note [name] DOB: 1941/07/24 MRN: [id number]   Date of Service  09/19/2024  HPI/Events of Note  Patient has been going in and out of A-fib.  Per daytime physician's note, A-fib is likely in the setting of sepsis and pulmonary embolism.  Amiodarone was held by dayshift RN.  Patient's rate is controlled but still going in and out of A-fib. Patient also complaining of chest pain for which she received fentanyl  x 2 earlier this evening.  eICU Interventions  Will restart amiodarone at 1 mg/min and let it run overnight.  To be reassessed in the morning.  Oxycodone  5 mg every 4 hours as needed ordered for pain.   Discussed with RN.     Intervention Category Intermediate Interventions: Arrhythmia - evaluation and management  Jerilynn Berg 09/19/2024, 7:56 PM

## 2024-09-19 NOTE — Progress Notes (Signed)
 PHARMACY - ANTICOAGULATION CONSULT NOTE  Pharmacy Consult for Heparin  Indication: pulmonary embolus  Allergies  Allergen Reactions   Bee Venom Anaphylaxis, Shortness Of Breath, Swelling and Other (See Comments)    Swelling at site (reaction to bees and wasps)   Amlodipine  Swelling and Other (See Comments)    Swelling of the ankles and hands    Levofloxacin Other (See Comments)    Joint pain    Alendronate  Other (See Comments)    Joint pains / hypercalcemia    Hctz [Hydrochlorothiazide ] Palpitations and Other (See Comments)    Sweating     Patient Measurements: Height: 5' 3 (160 cm) Weight: 66.8 kg (147 lb 4.3 oz) IBW/kg (Calculated) : 52.4 HEPARIN  DW (KG): 67.1  Vital Signs: Temp: 97.9 F (36.6 C) (11/08 1300) BP: 159/95 (11/08 1300) Pulse Rate: 68 (11/08 1300)  Labs: Recent Labs    09/17/24 0214 09/17/24 0214 09/17/24 0744 09/17/24 1037 09/17/24 1712 09/17/24 1936 09/18/24 0433 09/18/24 0816 09/18/24 1812 09/19/24 0541 09/19/24 1256 09/19/24 1548  HGB  --   --  10.6*   < >  --  10.9* 10.0*  --   --  9.8*  --   --   HCT  --   --  31.6*   < >  --  31.8* 29.6*  --   --  28.3*  --   --   PLT 106*  --  80*  79*   < >  --  85* 68* 68*  --  72*  --   --   APTT >200*  --  76*  --   --   --   --  29  --   --   --   --   LABPROT 16.9*  --  14.3  --   --   --   --  13.6  --   --   --   --   INR 1.3*  --  1.0  --   --   --   --  1.0  --   --   --   --   HEPARINUNFRC 1.04*  --   --   --   --   --   --   --  0.31 0.26*  --  0.53  CREATININE  --    < > 1.60*  --  1.41*  --   --  1.46*  --   --  1.39*  --    < > = values in this interval not displayed.    Estimated Creatinine Clearance: 29.2 mL/min (A) (by C-G formula based on SCr of 1.39 mg/dL (H)).   Medical History: Past Medical History:  Diagnosis Date   Anemia    was while doing chemo   Anxiety    Arthritis    Asthma    Chemotherapy-induced neuropathy 11/01/2015   CHF (congestive heart failure) (HCC)     Claustrophobia    COPD (chronic obstructive pulmonary disease) (HCC)    Depression    Encounter for antineoplastic chemotherapy 02/09/2016   GERD (gastroesophageal reflux disease)    Headache    prior to menopause   Heart murmur    History of echocardiogram    Echo 2/17: EF 60-65%, grade 1 diastolic dysfunction, mild MR, trivial pericardial effusion   History of nuclear stress test    Myoview 2/17: no ischemia or scar, EF 79%; low risk   History of radiation therapy 10/30/16-11/06/16   Hyperlipidemia    Hypertension  Insomnia 05/16/2016   Myocardial infarction [facility])    Non-small cell carcinoma of right lung, stage 2 (HCC) 10/03/2015   Oxygen  deficiency    Pneumonia    Radiation 02/29/16-04/10/16   50.4 Gy to right central chest   Renal cell carcinoma (HCC)    L nephrectomy  in 2012    Assessment:  AC/Heme: UFH for B PE w/ R heart strain, ddimer 11.66. +Afib - 11/6 Heparin  held for anemia, bleeding from Aline R groin. CT angio GIB w c/f possible pseudoaneurysm and some bleeding from arterial access. D/c Aline - 11/7: Hgb 10 down, Plts 68 down. Groin hematoma improved per MD. Resume Heparin  at last rate of 700 units/hr  09/19/2024: -Heparin  level therapeutic with heparin  infusing at 800 units/hr -CBC: Hg 9.8, Plt 72- low but stable -Per RN, no bleeding and hematoma not worsening  Goal of Therapy:  Heparin  level 0.3-0.7 units/ml Monitor platelets by anticoagulation protocol: Yes   Plan:  - Continue heparin  infusion at 800 units/hr - Recheck heparin  level in 8h for confirmation  - Daily heparin  level and CBC   Eva CHRISTELLA Allis, PharmD, BCPS Secure Chat if ?s 09/19/2024 4:33 PM

## 2024-09-19 NOTE — Plan of Care (Signed)
  Problem: Education: Goal: Knowledge of General Education information will improve Description: Including pain rating scale, medication(s)/side effects and non-pharmacologic comfort measures Outcome: Progressing   Problem: Health Behavior/Discharge Planning: Goal: Ability to manage health-related needs will improve Outcome: Progressing   Problem: Clinical Measurements: Goal: Ability to maintain clinical measurements within normal limits will improve Outcome: Progressing Goal: Will remain free from infection Outcome: Progressing   Problem: Activity: Goal: Risk for activity intolerance will decrease Outcome: Progressing   Problem: Elimination: Goal: Will not experience complications related to bowel motility Outcome: Progressing Goal: Will not experience complications related to urinary retention Outcome: Progressing

## 2024-09-19 NOTE — Procedures (Signed)
 Extubation Procedure Note  Patient Details:   Name: Tammy Ball DOB: 27-Sep-1941 MRN: [id number]   Airway Documentation:    Vent end date: 09/19/24 Vent end time: 0900   Evaluation  O2 sats: stable throughout Complications: No apparent complications Patient did tolerate procedure well. Bilateral Breath Sounds: Rhonchi   Yes Patient tolerated wean. MD ordered to extubate. Positive for cuff leak. Patient extubated to a 6 Lpm [employer]. No signs of dyspnea or stridor noted. RN at bedside.    Estell Sailors 09/19/2024, 9:08 AM

## 2024-09-19 NOTE — Progress Notes (Addendum)
 NAME:  Tammy Ball, MRN:  [id number], DOB:  July 08, 1941, LOS: 4 ADMISSION DATE:  09/15/2024, CONSULTATION DATE:  11/4 REFERRING MD:  Dr. Randol, CHIEF COMPLAINT:  septic shock; pna; PE   History of Present Illness:  Patient is a 83 yo F w/ pertinent PMH lung cancer s/p lobectomy and cehmo, reneal cell carcinoma w/ left nephrectomy, diastolic chf, COPD on 2L [place] at home, thoracic spinal fracture involving T7, htn, hld, cad presents to [facility] on 11/4 w/ left sided chest pain.  Patient admitted last month for back. Imaging noted T7 compression spinal fracture. While in hospital patient developed worsening respiratory failure thought 2/2 to hypoventilation/atelectasis from severe pain requiring bipap. Completed abx for possible pna. IR performed kyphoplasty on 10/21 w/ improvement. Discharged to SNF for ongoing therapy.  On 11/3 patient developed cough w/ blood tinged sputum. On 11/4 patient woke up w/ acute left sided chest/back pain w/ sob. States she felt like she had fever and bodyaches. O2 had to be increased from 2 to 4L at facility. Brought to [facility] ED. On arrival patient temp 99.3 F and wbc 10. LA 1.9. BS w/ rhonchi/rales. BP soft. CXR w/ increased left basilar opacities and small left pleural effusion. Cultures obtained, given fluids, and started on rocephin /azithro. UA pending. D-dimer elevated 11.66 and trop 93. CTA chest showing multiple b/l segmental PE w/ evidence of R heart strain; RLL pleural based opacification; moderate consolidation over LLL w/ small left pleural effusion. Heparin  started. Despite iv fluids patient remained hypotensive requiring levo. Pccm consulted.  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   CHF (congestive heart failure) (HCC)    Claustrophobia    COPD (chronic obstructive pulmonary disease) (HCC)    Depression    Encounter  for antineoplastic chemotherapy 02/09/2016   GERD (gastroesophageal reflux disease)    Headache    prior to menopause   Heart murmur    History of echocardiogram    Echo 2/17: EF 60-65%, grade 1 diastolic dysfunction, mild MR, trivial pericardial effusion   History of nuclear stress test    Myoview 2/17: no ischemia or scar, EF 79%; low risk   History of radiation therapy 10/30/16-11/06/16   Hyperlipidemia    Hypertension    Insomnia 05/16/2016   Myocardial infarction (HCC)    Non-small cell carcinoma of right lung, stage 2 (HCC) 10/03/2015   Oxygen  deficiency    Pneumonia    Radiation 02/29/16-04/10/16   50.4 Gy to right central chest   Renal cell carcinoma (HCC)    L nephrectomy  in 2012     Significant Hospital Events: Including procedures, antibiotic start and stop dates in addition to other pertinent events   11/4 admit w/ pna and pe on pressors 11/4 intubated, central venous access, on pressors-Levophed, on epoprostenol, 11/5 art line 11/6 oozig from art line, labile blood pressures and HR. C/f hemorrhagic shock w hgb 7.3 getting 2 PRBC  11/8 Extubated, Afib RVR briefly on amiodarone.  chest pain, started argatroban while heparin  is on hold, pending HIT antibodies    Interim History / Subjective:  Alert and oriented, able to express herself.  Reports no pain.  Reports feeling comfortable on the ventilator.  Objective    Blood pressure 108/77, pulse (!) 143, temperature 99.1 F (37.3 C), resp. rate (!) 21, height  5' 3 (1.6 m), weight 63.3 kg, SpO2 95%. CVP:  [1 mmHg-17 mmHg] 5 mmHg  Vent Mode: PRVC FiO2 (%):  [40 %-50 %] 40 % Set Rate:  [20 bmp] 20 bmp Vt Set:  [410 mL] 410 mL PEEP:  [5 cmH20-10 cmH20] 5 cmH20 Pressure Support:  [10 cmH20] 10 cmH20 Plateau Pressure:  [11 cmH20-20 cmH20] 16 cmH20   Intake/Output Summary (Last 24 hours) at 09/19/2024 0729 Last data filed at 09/19/2024 9342 Gross per 24 hour  Intake 859.53 ml  Output 1253 ml  Net -393.47 ml    Filed Weights   09/16/24 0500 09/17/24 0500 09/18/24 0500  Weight: 69.3 kg 79.2 kg 63.3 kg    Examination: General: Elderly female, intubated, not sedated, no acute distress Lungs: Clear to auscultation bilaterally Heart: Tachycardic with an irregular rhythm, heart rate 160 on the monitor Abdomen: Soft, nontender, nondistended Neuro: Alert and oriented, following commands   Resolved problem list   Assessment and Plan    Septic shock Acinetobacter bacteremia PNA -On Unasyn day 8.  Tobramycin  changed to minocycline per ID. -awaiting sensitivities. - Off norepinephrine and vasopressin.  Reducing stress dose steroids to 50 mg twice daily   A-fib with rapid ventricular rate Prior episodes of A-fib/flutter with variable rate on this admission.  Today noted to be in rapid ventricular rate with lower blood pressures.  Started on IV amiodarone with an amiodarone bolus.  A-fib likely in the setting of sepsis and pulmonary embolism.  No evidence of acute bleeding and hemoglobin stable today compared to yesterday.  She will need outpatient monitoring for recurrence of A-fib to determine if it is only related to sepsis and PE or a recurrent phenomena.   Acute on chronic resp failure w hypoxia  PE pHTN Hx lung ca s/p RUL lobectomy  COPD on 2 L at home -Continue LT VV. - Vent bundle. - Weaning on vent settings.  Currently on SBT with pressure support 5/5> will attempt extubation today >> Extubated later in the day  -Was started on epoprostenol in the hospital which has been discontinued due to low blood pressure. - Continue nebs.   AKI  Hx RCC s/p L nephrectomy  -Creatinine improving. - Monitor.  Anemia-stable likely blood loss. Thrombocytopenia -Drop in hemoglobin thought secondary to bleeding from arterial line site.  Art line discontinued status post pressure. - CT abdomen with concern for small right groin pseudoaneurysm.  - PTT, INR normal.  Fibrinogen elevated.  No  schistocytes seen.  TTP, DIC less likely.  Platelets down since admission.  Less likely HIT.  4T score 4. P -HIT antibody. - Monitor platelets and hemoglobin. - Right groin ultrasound if needed. I have asked vascular surgery for input regarding for the procedure.   AoC Diastolic HF HTN HLD  P -home meds on hold acutely -heparin  currently on hold w bleeding concerns  Thoracic compression fxs Recent T7 kyphoplasty 10/21 w IR  -no acute intervention  - Will need PT and placement close to discharge.  Labs   CBC: Recent Labs  Lab 09/17/24 0744 09/17/24 1037 09/17/24 1434 09/17/24 1936 09/18/24 0433 09/18/24 0816 09/19/24 0541  WBC 9.2  --  9.2 10.0 8.8  --  10.6*  HGB 10.6* 9.9* 10.8* 10.9* 10.0*  --  9.8*  HCT 31.6* 28.8* 32.2* 31.8* 29.6*  --  28.3*  MCV 92.1  --  90.7 90.3 90.5  --  90.7  PLT 80*  79*  --  82* 85* 68* 68* 72*  Basic Metabolic Panel: Recent Labs  Lab 09/15/24 1008 09/16/24 0440 09/16/24 2216 09/17/24 0744 09/17/24 1712 09/18/24 0816  NA 138 134* 135 133* 135 136  K 4.3 4.0 4.2 4.9 3.5 3.3*  CL 90* 94*  --  96* 99 98  CO2 38* 31  --  27 26 29   GLUCOSE 101* 141*  --  170* 139* 130*  BUN 33* 34*  --  33* 32* 37*  CREATININE 1.35* 1.43*  --  1.60* 1.41* 1.46*  CALCIUM  10.1 8.3*  --  8.3* 8.2* 8.3*   GFR: Estimated Creatinine Clearance: 27.1 mL/min (A) (by C-G formula based on SCr of 1.46 mg/dL (H)). Recent Labs  Lab 09/15/24 1007 09/15/24 1008 09/15/24 1253 09/15/24 2210 09/16/24 0440 09/17/24 0744 09/17/24 1434 09/17/24 1936 09/18/24 0433 09/19/24 0541  WBC  --    < >  --   --    < > 9.2 9.2 10.0 8.8 10.6*  LATICACIDVEN 1.9  --  11.1* 1.3  --  2.9*  --   --   --   --    < > = values in this interval not displayed.    Liver Function Tests: Recent Labs  Lab 09/16/24 0440 09/17/24 0744  AST 23 14*  ALT 13 10  ALKPHOS 60 54  BILITOT 0.9 0.9  PROT 4.9* 4.2*  ALBUMIN  2.9* 2.2*   No results for input(s): LIPASE, AMYLASE  in the last 168 hours. No results for input(s): AMMONIA in the last 168 hours.  ABG    Component Value Date/Time   PHART 7.38 09/17/2024 1210   PCO2ART 47 09/17/2024 1210   PO2ART 98 09/17/2024 1210   HCO3 27.8 09/17/2024 1210   TCO2 30 09/16/2024 2216   ACIDBASEDEF 1.0 01/18/2016 1120   O2SAT 99.2 09/17/2024 1210     Coagulation Profile: Recent Labs  Lab 09/15/24 1008 09/17/24 0214 09/17/24 0744 09/18/24 0816  INR 0.9 1.3* 1.0 1.0    Cardiac Enzymes: No results for input(s): CKTOTAL, CKMB, CKMBINDEX, TROPONINI in the last 168 hours.  HbA1C: Hgb A1c MFr Bld  Date/Time Value Ref Range Status  09/16/2024 04:40 AM 5.8 (H) 4.8 - 5.6 % Final    Comment:    (NOTE) Diagnosis of Diabetes The following HbA1c ranges recommended by the American Diabetes Association (ADA) may be used as an aid in the diagnosis of diabetes mellitus.  Hemoglobin             Suggested A1C NGSP%              Diagnosis  <5.7                   Non Diabetic  5.7-6.4                Pre-Diabetic  >6.4                   Diabetic  <7.0                   Glycemic control for                       adults with diabetes.      CBG: Recent Labs  Lab 09/18/24 1136 09/18/24 1600 09/18/24 2013 09/19/24 0019 09/19/24 0421  GLUCAP 111* 130* 113* 114* 105*   The patient is critically ill due to acute hypoxic respiratory failure requiring ventilator titration.  Critical care was necessary to treat or prevent  imminent or life-threatening deterioration. Critical care time was spent by me on the following activities: development of a treatment plan with the patient and/or surrogate as well as nursing, discussions with consultants, evaluation of the patient's response to treatment, examination of the patient, obtaining a history from the patient or surrogate, ordering and performing treatments and interventions, ordering and review of laboratory studies, ordering and review of radiographic studies,  review of telemetry data including pulse oximetry, re-evaluation of patient's condition and participation in multidisciplinary rounds.   I personally spent 34 minutes providing critical care not including any separately billable procedures.   Zola LOISE Herter, MD [facility] 09/19/2024 7:38 AM

## 2024-09-19 NOTE — Progress Notes (Signed)
 eLink Physician-Brief Progress Note [name] DOB: 27-May-1941 MRN: [id number]   Date of Service  09/19/2024  HPI/Events of Note  Notified of atrial fibrillation with rate 130-140s lasting for about 10 minutes.  On camera assessment, HR in the 80s-90s and remains in atrial fibrillation. SBP 140s-150s.   eICU Interventions  Give metoprolol  5mg  IV PRN for persistent HR >120.      Intervention Category Intermediate Interventions: Arrhythmia - evaluation and management  Kellyanne Ellwanger 09/19/2024, 6:10 AM

## 2024-09-19 NOTE — Progress Notes (Signed)
 PHARMACY - ANTICOAGULATION CONSULT NOTE  Pharmacy Consult for Heparin  Indication: pulmonary embolus  Allergies  Allergen Reactions   Bee Venom Anaphylaxis, Shortness Of Breath, Swelling and Other (See Comments)    Swelling at site (reaction to bees and wasps)   Amlodipine  Swelling and Other (See Comments)    Swelling of the ankles and hands    Levofloxacin Other (See Comments)    Joint pain    Alendronate  Other (See Comments)    Joint pains / hypercalcemia    Hctz [Hydrochlorothiazide ] Palpitations and Other (See Comments)    Sweating     Patient Measurements: Height: 5' 3 (160 cm) Weight: 63.3 kg (139 lb 8.8 oz) IBW/kg (Calculated) : 52.4 HEPARIN  DW (KG): 67.1  Vital Signs: Temp: 98.6 F (37 C) (11/08 0300) BP: 149/79 (11/08 0300) Pulse Rate: 64 (11/08 0300)  Labs: Recent Labs    09/17/24 0214 09/17/24 0744 09/17/24 1037 09/17/24 1712 09/17/24 1936 09/18/24 0433 09/18/24 0816 09/18/24 1812 09/19/24 0541  HGB  --  10.6*   < >  --  10.9* 10.0*  --   --  9.8*  HCT  --  31.6*   < >  --  31.8* 29.6*  --   --  28.3*  PLT 106* 80*  79*   < >  --  85* 68* 68*  --  72*  APTT >200* 76*  --   --   --   --  29  --   --   LABPROT 16.9* 14.3  --   --   --   --  13.6  --   --   INR 1.3* 1.0  --   --   --   --  1.0  --   --   HEPARINUNFRC 1.04*  --   --   --   --   --   --  0.31 0.26*  CREATININE  --  1.60*  --  1.41*  --   --  1.46*  --   --    < > = values in this interval not displayed.    Estimated Creatinine Clearance: 27.1 mL/min (A) (by C-G formula based on SCr of 1.46 mg/dL (H)).   Medical History: Past Medical History:  Diagnosis Date   Anemia    was while doing chemo   Anxiety    Arthritis    Asthma    Chemotherapy-induced neuropathy 11/01/2015   CHF (congestive heart failure) (HCC)    Claustrophobia    COPD (chronic obstructive pulmonary disease) (HCC)    Depression    Encounter for antineoplastic chemotherapy 02/09/2016   GERD (gastroesophageal  reflux disease)    Headache    prior to menopause   Heart murmur    History of echocardiogram    Echo 2/17: EF 60-65%, grade 1 diastolic dysfunction, mild MR, trivial pericardial effusion   History of nuclear stress test    Myoview 2/17: no ischemia or scar, EF 79%; low risk   History of radiation therapy 10/30/16-11/06/16   Hyperlipidemia    Hypertension    Insomnia 05/16/2016   Myocardial infarction (HCC)    Non-small cell carcinoma of right lung, stage 2 (HCC) 10/03/2015   Oxygen  deficiency    Pneumonia    Radiation 02/29/16-04/10/16   50.4 Gy to right central chest   Renal cell carcinoma (HCC)    L nephrectomy  in 2012    Assessment:  AC/Heme: UFH for B PE w/ R heart strain, ddimer 11.66. +Afib -  11/6 Heparin  held for anemia, bleeding from Aline R groin. CT angio GIB w c/f possible pseudoaneurysm and some bleeding from arterial access. D/c Aline - 11/7: Hgb 10 down, Plts 68 down. Groin hematoma improved per MD. Resume Heparin  at last rate of 700 units/hr  09/19/2024: -Heparin  level 0.26 -now sub-therapeutic with heparin  infusing at 700 units/hr -CBC: Hg 9.8, Plt 72- low but stable -No complications of therapy noted  Goal of Therapy:  Heparin  level 0.3-0.7 units/ml Monitor platelets by anticoagulation protocol: Yes   Plan:  - Increase heparin  infusion to 800 units/hr - Recheck heparin  level in 8h - Daily heparin  level and CBC   Rosaline Millet, PharmD, BCPS Clinical Pharmacist 09/19/2024 6:03 AM

## 2024-09-20 DIAGNOSIS — J9621 Acute and chronic respiratory failure with hypoxia: Secondary | ICD-10-CM | POA: Diagnosis not present

## 2024-09-20 DIAGNOSIS — J449 Chronic obstructive pulmonary disease, unspecified: Secondary | ICD-10-CM | POA: Diagnosis not present

## 2024-09-20 DIAGNOSIS — A419 Sepsis, unspecified organism: Secondary | ICD-10-CM | POA: Diagnosis not present

## 2024-09-20 DIAGNOSIS — R6521 Severe sepsis with septic shock: Secondary | ICD-10-CM | POA: Diagnosis not present

## 2024-09-20 LAB — BPAM RBC
Blood Product Expiration Date: 202511252359
Blood Product Expiration Date: 202511252359
Blood Product Expiration Date: 202511282359
Blood Product Expiration Date: 202511282359
ISSUE DATE / TIME: 202511060150
ISSUE DATE / TIME: 202511060544
Unit Type and Rh: 6200
Unit Type and Rh: 6200
Unit Type and Rh: 6200
Unit Type and Rh: 6200

## 2024-09-20 LAB — TYPE AND SCREEN
ABO/RH(D): A POS
Antibody Screen: NEGATIVE
Unit division: 0
Unit division: 0
Unit division: 0
Unit division: 0

## 2024-09-20 LAB — COOXEMETRY PANEL
Carboxyhemoglobin: 1.5 % (ref 0.5–1.5)
Methemoglobin: <0.7 % (ref 0.0–1.5)
O2 Saturation: 78.3 %
Total hemoglobin: 10.1 g/dL — ABNORMAL LOW (ref 12.0–16.0)

## 2024-09-20 LAB — BLOOD GAS, ARTERIAL
Acid-base deficit: 1.5 mmol/L (ref 0.0–2.0)
Bicarbonate: 27.6 mmol/L (ref 20.0–28.0)
Delivery systems: POSITIVE
Expiratory PAP: 6 cmH2O
Inspiratory PAP: 18 cmH2O
O2 Content: 40 L/min
O2 Saturation: 94.6 %
Patient temperature: 35.8
pCO2 arterial: 63 mmHg — ABNORMAL HIGH (ref 32–48)
pH, Arterial: 7.25 — ABNORMAL LOW (ref 7.35–7.45)
pO2, Arterial: 55 mmHg — ABNORMAL LOW (ref 83–108)

## 2024-09-20 LAB — CULTURE, BLOOD (ROUTINE X 2): Culture: NO GROWTH

## 2024-09-20 LAB — CBC
HCT: 31.2 % — ABNORMAL LOW (ref 36.0–46.0)
Hemoglobin: 9.8 g/dL — ABNORMAL LOW (ref 12.0–15.0)
MCH: 30.7 pg (ref 26.0–34.0)
MCHC: 31.4 g/dL (ref 30.0–36.0)
MCV: 97.8 fL (ref 80.0–100.0)
Platelets: 86 K/uL — ABNORMAL LOW (ref 150–400)
RBC: 3.19 MIL/uL — ABNORMAL LOW (ref 3.87–5.11)
RDW: 17.1 % — ABNORMAL HIGH (ref 11.5–15.5)
WBC: 9.8 K/uL (ref 4.0–10.5)
nRBC: 0 % (ref 0.0–0.2)

## 2024-09-20 LAB — BLOOD GAS, VENOUS
Acid-base deficit: 2.4 mmol/L — ABNORMAL HIGH (ref 0.0–2.0)
Bicarbonate: 28.9 mmol/L — ABNORMAL HIGH (ref 20.0–28.0)
O2 Saturation: 77.2 %
Patient temperature: 36.1
pCO2, Ven: 82 mmHg (ref 44–60)
pH, Ven: 7.15 — CL (ref 7.25–7.43)
pO2, Ven: 41 mmHg (ref 32–45)

## 2024-09-20 LAB — HEPARIN LEVEL (UNFRACTIONATED)
Heparin Unfractionated: 0.82 [IU]/mL — ABNORMAL HIGH (ref 0.30–0.70)
Heparin Unfractionated: 0.69 [IU]/mL (ref 0.30–0.70)

## 2024-09-20 LAB — GLUCOSE, CAPILLARY
Glucose-Capillary: 103 mg/dL — ABNORMAL HIGH (ref 70–99)
Glucose-Capillary: 121 mg/dL — ABNORMAL HIGH (ref 70–99)
Glucose-Capillary: 88 mg/dL (ref 70–99)
Glucose-Capillary: 92 mg/dL (ref 70–99)
Glucose-Capillary: 95 mg/dL (ref 70–99)
Glucose-Capillary: 96 mg/dL (ref 70–99)

## 2024-09-20 LAB — BASIC METABOLIC PANEL WITH GFR
Anion gap: 10 (ref 5–15)
BUN: 37 mg/dL — ABNORMAL HIGH (ref 8–23)
CO2: 28 mmol/L (ref 22–32)
Calcium: 9.1 mg/dL (ref 8.9–10.3)
Chloride: 101 mmol/L (ref 98–111)
Creatinine, Ser: 1.61 mg/dL — ABNORMAL HIGH (ref 0.44–1.00)
GFR, Estimated: 32 mL/min — ABNORMAL LOW (ref >60)
Glucose, Bld: 101 mg/dL — ABNORMAL HIGH (ref 70–99)
Potassium: 4.8 mmol/L (ref 3.5–5.1)
Sodium: 139 mmol/L (ref 135–145)

## 2024-09-20 LAB — HEPARIN INDUCED PLATELET AB (HIT ANTIBODY): Heparin Induced Plt Ab: 0.035 {OD_unit} (ref 0.000–0.400)

## 2024-09-20 LAB — MAGNESIUM: Magnesium: 2.4 mg/dL (ref 1.7–2.4)

## 2024-09-20 MED ORDER — AMIODARONE HCL IN DEXTROSE 360-4.14 MG/200ML-% IV SOLN
30.0000 mg/h | INTRAVENOUS | Status: DC
  Administered 2024-09-21 – 2024-09-23 (×7): 30 mg/h via INTRAVENOUS
  Filled 2024-09-20 (×8): qty 200

## 2024-09-20 MED ORDER — DEXMEDETOMIDINE HCL IN NACL 200 MCG/50ML IV SOLN
0.0000 ug/kg/h | INTRAVENOUS | Status: DC
  Administered 2024-09-20: 0.4 ug/kg/h via INTRAVENOUS
  Administered 2024-09-21: 0.3 ug/kg/h via INTRAVENOUS
  Filled 2024-09-20: qty 50

## 2024-09-20 MED ORDER — HEPARIN (PORCINE) 25000 UT/250ML-% IV SOLN
700.0000 [IU]/h | INTRAVENOUS | Status: DC
  Administered 2024-09-21: 700 [IU]/h via INTRAVENOUS
  Filled 2024-09-20: qty 250

## 2024-09-20 MED ORDER — FUROSEMIDE 10 MG/ML IJ SOLN
40.0000 mg | Freq: Four times a day (QID) | INTRAMUSCULAR | Status: AC
  Administered 2024-09-20 (×2): 40 mg via INTRAVENOUS
  Filled 2024-09-20 (×2): qty 4

## 2024-09-20 MED ORDER — AMIODARONE HCL IN DEXTROSE 360-4.14 MG/200ML-% IV SOLN
60.0000 mg/h | INTRAVENOUS | Status: AC
  Administered 2024-09-20 (×2): 60 mg/h via INTRAVENOUS

## 2024-09-20 MED ORDER — AMIODARONE LOAD VIA INFUSION
150.0000 mg | Freq: Once | INTRAVENOUS | Status: AC
  Administered 2024-09-20: 150 mg via INTRAVENOUS
  Filled 2024-09-20: qty 83.34

## 2024-09-20 MED ORDER — DOCUSATE SODIUM 100 MG PO CAPS
100.0000 mg | ORAL_CAPSULE | Freq: Two times a day (BID) | ORAL | Status: DC | PRN
  Administered 2024-09-21: 100 mg via ORAL
  Filled 2024-09-20: qty 1

## 2024-09-20 MED ORDER — TRAMADOL HCL 50 MG PO TABS
25.0000 mg | ORAL_TABLET | Freq: Two times a day (BID) | ORAL | Status: DC | PRN
  Administered 2024-09-20: 25 mg via ORAL
  Filled 2024-09-20: qty 1

## 2024-09-20 MED ORDER — NOREPINEPHRINE 4 MG/250ML-% IV SOLN
INTRAVENOUS | Status: AC
  Administered 2024-09-20: 4 mg
  Filled 2024-09-20: qty 250

## 2024-09-20 MED ORDER — NOREPINEPHRINE 4 MG/250ML-% IV SOLN
0.0000 ug/min | INTRAVENOUS | Status: DC
  Administered 2024-09-20: 5 ug/min via INTRAVENOUS
  Administered 2024-09-21: 6 ug/min via INTRAVENOUS
  Administered 2024-09-22: 10 ug/min via INTRAVENOUS
  Administered 2024-09-22 (×2): 2 ug/min via INTRAVENOUS
  Filled 2024-09-20 (×3): qty 250

## 2024-09-20 MED ORDER — POLYETHYLENE GLYCOL 3350 17 G PO PACK
17.0000 g | PACK | Freq: Every day | ORAL | Status: DC | PRN
  Administered 2024-09-22: 17 g via ORAL
  Filled 2024-09-20: qty 1

## 2024-09-20 MED ORDER — QUETIAPINE FUMARATE 50 MG PO TABS
50.0000 mg | ORAL_TABLET | Freq: Every day | ORAL | Status: DC
  Administered 2024-09-20 – 2024-09-21 (×2): 50 mg via ORAL
  Filled 2024-09-20 (×2): qty 1

## 2024-09-20 MED ORDER — ACETAMINOPHEN 325 MG PO TABS: 650.0000 mg | ORAL_TABLET | ORAL | Status: DC | PRN

## 2024-09-20 MED ORDER — MINOCYCLINE HCL 50 MG PO CAPS
200.0000 mg | ORAL_CAPSULE | Freq: Two times a day (BID) | ORAL | Status: DC
Start: 2024-09-20 — End: 2024-09-23
  Administered 2024-09-20 – 2024-09-23 (×3): 200 mg via ORAL
  Filled 2024-09-20 (×8): qty 4

## 2024-09-20 MED ORDER — CYCLOBENZAPRINE HCL 5 MG PO TABS: 5.0000 mg | ORAL_TABLET | Freq: Every day | ORAL | Status: DC | PRN

## 2024-09-20 MED ORDER — ATORVASTATIN CALCIUM 40 MG PO TABS
40.0000 mg | ORAL_TABLET | Freq: Every day | ORAL | Status: DC
  Administered 2024-09-20 – 2024-09-23 (×2): 40 mg via ORAL
  Filled 2024-09-20 (×2): qty 1

## 2024-09-20 NOTE — Progress Notes (Addendum)
 eLink Physician-Brief Progress Note Patient Name: SUMAYYA MUHA DOB: 15-Aug-1941 MRN: [id number]   Date of Service  09/20/2024  HPI/Events of Note  Notified of VBG with pH 7.15/pCO2 82 on 5L nasal cannula.   Pt was restarted on amiodarone gtt for rapid atrial fibrillation.  Pt also given IVF bolus for hypotension and patient seems to be responding to this.  Current SBP in the 120s.   RN reporting altered mental status, compared to earlier in the day when she was alert and oriented x 4. She was able to say the year but not the month.  Non-focal neurologic exam.  Pt is seen moving bilateral upper extremities equally.   eICU Interventions  Place the patient on BIPAP.  Recheck ABG tonight.  Start on levophed if MAP remains low after IVF bolus.      Intervention Category Intermediate Interventions: Respiratory distress - evaluation and management  Shanda Busman 09/20/2024, 7:35 PM  8:53 PM Pt is restless on the BIPAP.  Pt received nighttime seroquel.   Spoke to the patient's son who was concerned about the patient complaint of chest pain which was felt to be pleuritic in nature, likely related to PE. The son was also concerned about the patient getting some rest.  Plan> Start on precedex gtt to allow for better tolerance of BIPAP.  Continue with pain medications as ordered.  Repeat ABG tonight.   10:02 PM Notified by RN that she is holding PO meds tonight including metoprolol  and minocycline.   HR 50s, MAP 69, RR 20, o2 sats 100%.   ABG improving to 7.25/63 < -- 7.15/82.  Plan> Continue on BIPAP.  Ok to hold PO meds.   10:32 PM Notified of hypothermia with temp at 96.3.   Plan> Apply bair hugger.

## 2024-09-20 NOTE — Plan of Care (Signed)

## 2024-09-20 NOTE — Progress Notes (Signed)
 PHARMACY - ANTICOAGULATION CONSULT NOTE  Pharmacy Consult for heparin   Indication: pulmonary embolus  Allergies  Allergen Reactions   Bee Venom Anaphylaxis, Shortness Of Breath, Swelling and Other (See Comments)    Swelling at site (reaction to bees and wasps)   Amlodipine  Swelling and Other (See Comments)    Swelling of the ankles and hands    Levofloxacin Other (See Comments)    Joint pain    Alendronate  Other (See Comments)    Joint pains / hypercalcemia    Hctz [Hydrochlorothiazide ] Palpitations and Other (See Comments)    Sweating     Patient Measurements: Height: 5' 3 (160 cm) Weight: 66.8 kg (147 lb 4.3 oz) IBW/kg (Calculated) : 52.4 HEPARIN  DW (KG): 67.1  Vital Signs: Temp: 96.6 F (35.9 C) (11/09 0230) Temp Source: Bladder (11/09 0200) BP: 131/66 (11/09 0230) Pulse Rate: 62 (11/09 0230)  Labs: Recent Labs    09/17/24 0744 09/17/24 1037 09/17/24 1712 09/17/24 1936 09/18/24 0433 09/18/24 0816 09/18/24 1812 09/19/24 0541 09/19/24 1256 09/19/24 1548 09/20/24 0159  HGB 10.6*   < >  --  10.9* 10.0*  --   --  9.8*  --   --   --   HCT 31.6*   < >  --  31.8* 29.6*  --   --  28.3*  --   --   --   PLT 80*  79*   < >  --  85* 68* 68*  --  72*  --   --   --   APTT 76*  --   --   --   --  29  --   --   --   --   --   LABPROT 14.3  --   --   --   --  13.6  --   --   --   --   --   INR 1.0  --   --   --   --  1.0  --   --   --   --   --   HEPARINUNFRC  --   --   --   --   --   --    < > 0.26*  --  0.53 0.82*  CREATININE 1.60*  --  1.41*  --   --  1.46*  --   --  1.39*  --   --    < > = values in this interval not displayed.    Estimated Creatinine Clearance: 29.2 mL/min (A) (by C-G formula based on SCr of 1.39 mg/dL (H)).   Medical History: Past Medical History:  Diagnosis Date   Anemia    was while doing chemo   Anxiety    Arthritis    Asthma    Chemotherapy-induced neuropathy 11/01/2015   CHF (congestive heart failure) (HCC)    Claustrophobia     COPD (chronic obstructive pulmonary disease) (HCC)    Depression    Encounter for antineoplastic chemotherapy 02/09/2016   GERD (gastroesophageal reflux disease)    Headache    prior to menopause   Heart murmur    History of echocardiogram    Echo 2/17: EF 60-65%, grade 1 diastolic dysfunction, mild MR, trivial pericardial effusion   History of nuclear stress test    Myoview 2/17: no ischemia or scar, EF 79%; low risk   History of radiation therapy 10/30/16-11/06/16   Hyperlipidemia    Hypertension    Insomnia 05/16/2016   Myocardial infarction (HCC)  Non-small cell carcinoma of right lung, stage 2 (HCC) 10/03/2015   Oxygen  deficiency    Pneumonia    Radiation 02/29/16-04/10/16   50.4 Gy to right central chest   Renal cell carcinoma (HCC)    L nephrectomy  in 2012    Assessment: 83 yo F with new PE on CTA: multiple bilateral PEs with evidence of R heart strain. No anticoagulants PTA. CBC WNL. SCr 1.35  Significant Events: 11/6 Heparin  held for anemia, bleeding from Aline R groin. CT angio GIB w c/f possible pseudoaneurysm and some bleeding from arterial access. D/c Aline.  +2 units PRBCs - 11/7: Groin hematoma improved per MD. Resume Heparin   09/20/24 Heparin  level = 0.82 (supratherapeutic) with heparin  gtt @ 800 units/hr (level drawn peripherally, heparin  running thru [facility]).  RN reports no bleeding or infusion interruptions  Hgb 9.8-low/stable, PLTC 72- low/stable  Scr 1.39 HIT panel pending  Goal of Therapy:  Heparin  level 0.3-0.7 units/ml Monitor platelets by anticoagulation protocol: Yes   Plan:  Hold heparin  x 1 hr Restart heparin  at decreased rate of 700 units/hr Check 8 hour heparin  level after heparin  resumed Daily CBC & heparin  level   Rosaline Millet, PharmD, BCPS 09/20/2024 2:49 AM

## 2024-09-20 NOTE — Plan of Care (Signed)

## 2024-09-20 NOTE — Significant Event (Signed)
 She went into Afib w RVR again. Starting back on amiodarone load and gtt. Will need to complete the load as she has been going into afib w RVR when we stop amiodarone.

## 2024-09-20 NOTE — Progress Notes (Signed)
 PHARMACY - ANTICOAGULATION CONSULT NOTE  Pharmacy Consult for Heparin  Indication: pulmonary embolus + afib  Allergies  Allergen Reactions   Bee Venom Anaphylaxis, Shortness Of Breath, Swelling and Other (See Comments)    Swelling at site (reaction to bees and wasps)   Amlodipine  Swelling and Other (See Comments)    Swelling of the ankles and hands    Levofloxacin Other (See Comments)    Joint pain    Alendronate  Other (See Comments)    Joint pains / hypercalcemia    Hctz [Hydrochlorothiazide ] Palpitations and Other (See Comments)    Sweating     Patient Measurements: Height: 5' 3 (160 cm) Weight: 66.8 kg (147 lb 4.3 oz) IBW/kg (Calculated) : 52.4 HEPARIN  DW (KG): 67.1  Vital Signs: Temp: 96.8 F (36 C) (11/09 0800) Temp Source: Oral (11/09 0400) BP: 158/66 (11/09 0800) Pulse Rate: 66 (11/09 0800)  Labs: Recent Labs    09/18/24 0433 09/18/24 0816 09/18/24 1812 09/19/24 0541 09/19/24 1256 09/19/24 1548 09/20/24 0159 09/20/24 0741 09/20/24 1200  HGB 10.0*  --   --  9.8*  --   --   --  9.8*  --   HCT 29.6*  --   --  28.3*  --   --   --  31.2*  --   PLT 68* 68*  --  72*  --   --   --  86*  --   APTT  --  29  --   --   --   --   --   --   --   LABPROT  --  13.6  --   --   --   --   --   --   --   INR  --  1.0  --   --   --   --   --   --   --   HEPARINUNFRC  --   --    < > 0.26*  --  0.53 0.82*  --  0.69  CREATININE  --  1.46*  --   --  1.39*  --   --  1.61*  --    < > = values in this interval not displayed.    Estimated Creatinine Clearance: 25.2 mL/min (A) (by C-G formula based on SCr of 1.61 mg/dL (H)).   Medical History: Past Medical History:  Diagnosis Date   Anemia    was while doing chemo   Anxiety    Arthritis    Asthma    Chemotherapy-induced neuropathy 11/01/2015   CHF (congestive heart failure) (HCC)    Claustrophobia    COPD (chronic obstructive pulmonary disease) (HCC)    Depression    Encounter for antineoplastic chemotherapy 02/09/2016    GERD (gastroesophageal reflux disease)    Headache    prior to menopause   Heart murmur    History of echocardiogram    Echo 2/17: EF 60-65%, grade 1 diastolic dysfunction, mild MR, trivial pericardial effusion   History of nuclear stress test    Myoview 2/17: no ischemia or scar, EF 79%; low risk   History of radiation therapy 10/30/16-11/06/16   Hyperlipidemia    Hypertension    Insomnia 05/16/2016   Myocardial infarction [facility])    Non-small cell carcinoma of right lung, stage 2 (HCC) 10/03/2015   Oxygen  deficiency    Pneumonia    Radiation 02/29/16-04/10/16   50.4 Gy to right central chest   Renal cell carcinoma (HCC)    L nephrectomy  in 2012    Assessment: AC/Heme: UFH for B PE w/ R heart strain, ddimer 11.66. +Afib - 11/5 HL elevated - 11/6 Heparin  held for anemia, bleeding from Aline R groin. CT angio GIB w c/f possible pseudoaneurysm and some bleeding from arterial access. D/c Aline - 11/7: Hgb 10 down, Plts 68 down. Groin hematoma improved per MD. Resume Heparin  - 11/9: Hep level 0.82 high. Hgb 9.8, Plts 86 up slightly. In/out afib. Rate adjusted and repeat heparin  level 0.69 now in goal.  Goal of Therapy:  Heparin  level 0.3-0.7 units/ml Monitor platelets by anticoagulation protocol: Yes   Plan: Con't IV heparin  at 700 units/hr Daily heparin  level and CBC   Kyriaki Moder Karoline Marina, PharmD, BCPS Clinical Staff Pharmacist Marina Salines Stillinger 09/20/2024,12:37 PM

## 2024-09-20 NOTE — Progress Notes (Addendum)
 NAME:  Tammy Ball, MRN:  [id number], DOB:  06/06/1941, LOS: 5 ADMISSION DATE:  09/15/2024, CONSULTATION DATE:  11/4 REFERRING MD:  Dr. Randol, CHIEF COMPLAINT:  septic shock; pna; PE   History of Present Illness:  Patient is a 83 yo F w/ pertinent PMH lung cancer s/p lobectomy and cehmo, reneal cell carcinoma w/ left nephrectomy, diastolic chf, COPD on 2L [place] at home, thoracic spinal fracture involving T7, htn, hld, cad presents to [facility] on 11/4 w/ left sided chest pain.  Patient admitted last month for back. Imaging noted T7 compression spinal fracture. While in hospital patient developed worsening respiratory failure thought 2/2 to hypoventilation/atelectasis from severe pain requiring bipap. Completed abx for possible pna. IR performed kyphoplasty on 10/21 w/ improvement. Discharged to SNF for ongoing therapy.  On 11/3 patient developed cough w/ blood tinged sputum. On 11/4 patient woke up w/ acute left sided chest/back pain w/ sob. States she felt like she had fever and bodyaches. O2 had to be increased from 2 to 4L at facility. Brought to [facility] ED. On arrival patient temp 99.3 F and wbc 10. LA 1.9. BS w/ rhonchi/rales. BP soft. CXR w/ increased left basilar opacities and small left pleural effusion. Cultures obtained, given fluids, and started on rocephin /azithro. UA pending. D-dimer elevated 11.66 and trop 93. CTA chest showing multiple b/l segmental PE w/ evidence of R heart strain; RLL pleural based opacification; moderate consolidation over LLL w/ small left pleural effusion. Heparin  started. Despite iv fluids patient remained hypotensive requiring levo. Pccm consulted.  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   CHF (congestive heart failure) (HCC)    Claustrophobia    COPD (chronic obstructive pulmonary disease) (HCC)    Depression    Encounter  for antineoplastic chemotherapy 02/09/2016   GERD (gastroesophageal reflux disease)    Headache    prior to menopause   Heart murmur    History of echocardiogram    Echo 2/17: EF 60-65%, grade 1 diastolic dysfunction, mild MR, trivial pericardial effusion   History of nuclear stress test    Myoview 2/17: no ischemia or scar, EF 79%; low risk   History of radiation therapy 10/30/16-11/06/16   Hyperlipidemia    Hypertension    Insomnia 05/16/2016   Myocardial infarction (HCC)    Non-small cell carcinoma of right lung, stage 2 (HCC) 10/03/2015   Oxygen  deficiency    Pneumonia    Radiation 02/29/16-04/10/16   50.4 Gy to right central chest   Renal cell carcinoma (HCC)    L nephrectomy  in 2012     Significant Hospital Events: Including procedures, antibiotic start and stop dates in addition to other pertinent events   11/4 admit w/ pna and pe on pressors 11/4 intubated, central venous access, on pressors-Levophed, on epoprostenol, 11/5 art line 11/6 oozig from art line, labile blood pressures and HR. C/f hemorrhagic shock w hgb 7.3 getting 2 PRBC  11/8 Extubated, Afib RVR briefly on amiodarone.  chest pain, started argatroban while heparin  is on hold, pending HIT antibodies    Interim History / Subjective:  Doing well.  Extubated.  On supplemental oxygen . Complaining of chest pain which is pleuritic in nature.  Has not been sleeping well. Overnight was started on amiodarone because of irregular rhythm.  Heart rate was well-controlled.  Objective  Blood pressure (!) 125/95, pulse 65, temperature (!) 96.8 F (36 C), resp. rate (!) 21, height 5' 3 (1.6 m), weight 66.8 kg, SpO2 100%. CVP:  [3 mmHg] 3 mmHg  FiO2 (%):  [44 %] 44 %   Intake/Output Summary (Last 24 hours) at 09/20/2024 0758 Last data filed at 09/20/2024 0600 Gross per 24 hour  Intake 1798.1 ml  Output 850 ml  Net 948.1 ml   Filed Weights   09/17/24 0500 09/18/24 0500 09/19/24 0704  Weight: 79.2 kg 63.3 kg  66.8 kg    Examination: General: Elderly female, intubated, not sedated, no acute distress Lungs: Clear to auscultation bilaterally.  Reproducible chest pain. Heart: Now in normal sinus rhythm. Abdomen: Soft, nontender, nondistended Neuro: Alert and oriented, following commands  Repeat CT chest reviewed.  Decreased right-sided PE volume.  Left lower lobe consolidation.  Bilateral pleural effusion.  Resolved problem list   Assessment and Plan    Septic shock Acinetobacter bacteremia PNA -On Unasyn day 8.  Tobramycin  changed to minocycline per ID. -awaiting sensitivities. - Off norepinephrine and vasopressin.   - Stop stress dose steroid.   A-fib with rapid ventricular rate -Likely related to critical illness. - Now in normal sinus rhythm. - On beta-blocker. - Amiodarone discontinued. - Heparin  drip for below. - Repeat EKG now the patient is in normal sinus rhythm on telemetry.  Acute on chronic resp failure w hypoxia  PE pHTN Hx lung ca s/p RUL lobectomy  COPD on 2 L at home Chest pain - Extubated on 11/8. -Was started on epoprostenol in the hospital which has been discontinued due to low blood pressure. - Continue nebs.  Transition to home [facility]  close to discharge. - Heparin  drip for PE. - Tramadol  for chest pain.  EKG and repeat CT PE unremarkable.  AKI  Hx RCC s/p L nephrectomy  -Creatinine improving. - Monitor.  Anemia-stable likely blood loss. Thrombocytopenia Partially thrombosed right groin pseudoaneurysm -Drop in hemoglobin thought secondary to bleeding from arterial line site.  Art line discontinued status post pressure. - CT abdomen with concern for small right groin pseudoaneurysm.  - PTT, INR normal.  Fibrinogen elevated.  No schistocytes seen.  TTP, DIC less likely.  Platelets down since admission.  Less likely HIT.  4T score 4. - Venous ultrasound with partially thrombosed right groin pseudoaneurysm. P -HIT antibody pending but unlikely  HIT. - Monitor platelets and hemoglobin. - Will reach out to vascular surgery on 11/10 to see if there are any more interventions needed for this pseudoaneurysm.   AoC Diastolic HF HTN HLD  -Hold home losartan .  On beta-blocker. -Lasix  IV twice daily given today for positive fluid balance. -heparin  currently on hold w bleeding concerns  Thoracic compression fxs Recent T7 kyphoplasty 10/21 w IR  -no acute intervention  - Will need PT and placement close to discharge.  Adding Seroquel at night to help sleep.  Transferred to the floor today.  Labs   CBC: Recent Labs  Lab 09/17/24 0744 09/17/24 1037 09/17/24 1434 09/17/24 1936 09/18/24 0433 09/18/24 0816 09/19/24 0541  WBC 9.2  --  9.2 10.0 8.8  --  10.6*  HGB 10.6* 9.9* 10.8* 10.9* 10.0*  --  9.8*  HCT 31.6* 28.8* 32.2* 31.8* 29.6*  --  28.3*  MCV 92.1  --  90.7 90.3 90.5  --  90.7  PLT 80*  79*  --  82* 85* 68* 68* 72*    Basic Metabolic Panel: Recent Labs  Lab 09/16/24 0440 09/16/24 2216  09/17/24 0744 09/17/24 1712 09/18/24 0816 09/19/24 1026 09/19/24 1256  NA 134* 135 133* 135 136  --  140  K 4.0 4.2 4.9 3.5 3.3*  --  2.9*  CL 94*  --  96* 99 98  --  99  CO2 31  --  27 26 29   --  30  GLUCOSE 141*  --  170* 139* 130*  --  121*  BUN 34*  --  33* 32* 37*  --  35*  CREATININE 1.43*  --  1.60* 1.41* 1.46*  --  1.39*  CALCIUM  8.3*  --  8.3* 8.2* 8.3*  --  8.7*  MG  --   --   --   --   --  2.3  --    GFR: Estimated Creatinine Clearance: 29.2 mL/min (A) (by C-G formula based on SCr of 1.39 mg/dL (H)). Recent Labs  Lab 09/15/24 1007 09/15/24 1008 09/15/24 1253 09/15/24 2210 09/16/24 0440 09/17/24 0744 09/17/24 1434 09/17/24 1936 09/18/24 0433 09/19/24 0541  WBC  --    < >  --   --    < > 9.2 9.2 10.0 8.8 10.6*  LATICACIDVEN 1.9  --  11.1* 1.3  --  2.9*  --   --   --   --    < > = values in this interval not displayed.    Liver Function Tests: Recent Labs  Lab 09/16/24 0440 09/17/24 0744  09/19/24 1256  AST 23 14* 19  ALT 13 10 14   ALKPHOS 60 54 75  BILITOT 0.9 0.9 0.4  PROT 4.9* 4.2* 5.4*  ALBUMIN  2.9* 2.2* 2.8*   Recent Labs  Lab 09/19/24 1256  LIPASE 38   No results for input(s): AMMONIA in the last 168 hours.  ABG    Component Value Date/Time   PHART 7.38 09/17/2024 1210   PCO2ART 47 09/17/2024 1210   PO2ART 98 09/17/2024 1210   HCO3 27.8 09/17/2024 1210   TCO2 30 09/16/2024 2216   ACIDBASEDEF 1.0 01/18/2016 1120   O2SAT 99.2 09/17/2024 1210     Coagulation Profile: Recent Labs  Lab 09/15/24 1008 09/17/24 0214 09/17/24 0744 09/18/24 0816  INR 0.9 1.3* 1.0 1.0    Cardiac Enzymes: No results for input(s): CKTOTAL, CKMB, CKMBINDEX, TROPONINI in the last 168 hours.  HbA1C: Hgb A1c MFr Bld  Date/Time Value Ref Range Status  09/16/2024 04:40 AM 5.8 (H) 4.8 - 5.6 % Final    Comment:    (NOTE) Diagnosis of Diabetes The following HbA1c ranges recommended by the American Diabetes Association (ADA) may be used as an aid in the diagnosis of diabetes mellitus.  Hemoglobin             Suggested A1C NGSP%              Diagnosis  <5.7                   Non Diabetic  5.7-6.4                Pre-Diabetic  >6.4                   Diabetic  <7.0                   Glycemic control for                       adults with diabetes.      CBG: Recent Labs  Lab 09/19/24 1123 09/19/24 1523 09/19/24 1947 09/19/24 2253 09/20/24 0303  GLUCAP 106* 110* 100* 105* 96   CRITICAL CARE Performed by: Sammi JONETTA Fredericks.     Total critical care time: 51 minutes   Critical care time was exclusive of separately billable procedures and treating other patients.   Critical care was necessary to treat or prevent imminent or life-threatening deterioration.   Critical care was time spent personally by me on the following activities: development of treatment plan with patient and/or surrogate as well as nursing, discussions with consultants, evaluation of  patient's response to treatment, examination of patient, obtaining history from patient or surrogate, ordering and performing treatments and interventions, ordering and review of laboratory studies, ordering and review of radiographic studies, pulse oximetry, re-evaluation of patient's condition and participation in multidisciplinary rounds.  Sammi JONETTA Fredericks, MD Pulmonary, Critical Care and Sleep Attending.  Pager: [phone]  09/20/2024, 11:13 AM

## 2024-09-20 NOTE — Progress Notes (Signed)
   09/20/24 2254  BiPAP/CPAP/SIPAP  BiPAP/CPAP/SIPAP Pt Type Adult  BiPAP/CPAP/SIPAP V60  Mask Type Full face mask  Dentures removed? Not applicable  Mask Size Medium  Set Rate 20 breaths/min  Respiratory Rate 20 breaths/min  IPAP 18 cmH20  EPAP 6 cmH2O  FiO2 (%) 40 %  Minute Ventilation 8.3  Leak 15  Peak Inspiratory Pressure (PIP) 18  Tidal Volume (Vt) 402  Patient Home Machine No  Patient Home Mask No  Patient Home Tubing No  Auto Titrate No  Press High Alarm 35 cmH2O  Press Low Alarm 5 cmH2O  Device Plugged into RED Power Outlet Yes  BiPAP/CPAP /SiPAP Vitals  SpO2 100 %  Bilateral Breath Sounds Diminished

## 2024-09-21 ENCOUNTER — Inpatient Hospital Stay (HOSPITAL_COMMUNITY)

## 2024-09-21 DIAGNOSIS — I4891 Unspecified atrial fibrillation: Secondary | ICD-10-CM | POA: Diagnosis not present

## 2024-09-21 DIAGNOSIS — J9601 Acute respiratory failure with hypoxia: Secondary | ICD-10-CM | POA: Diagnosis not present

## 2024-09-21 DIAGNOSIS — R6521 Severe sepsis with septic shock: Secondary | ICD-10-CM | POA: Diagnosis not present

## 2024-09-21 DIAGNOSIS — J9602 Acute respiratory failure with hypercapnia: Secondary | ICD-10-CM

## 2024-09-21 DIAGNOSIS — R238 Other skin changes: Secondary | ICD-10-CM | POA: Diagnosis not present

## 2024-09-21 DIAGNOSIS — G9341 Metabolic encephalopathy: Secondary | ICD-10-CM

## 2024-09-21 DIAGNOSIS — A419 Sepsis, unspecified organism: Secondary | ICD-10-CM | POA: Diagnosis not present

## 2024-09-21 LAB — MAGNESIUM
Magnesium: 2.2 mg/dL (ref 1.7–2.4)
Magnesium: 2.3 mg/dL (ref 1.7–2.4)

## 2024-09-21 LAB — HEPARIN LEVEL (UNFRACTIONATED): Heparin Unfractionated: 0.62 [IU]/mL (ref 0.30–0.70)

## 2024-09-21 LAB — CBC
HCT: 28.8 % — ABNORMAL LOW (ref 36.0–46.0)
Hemoglobin: 9.3 g/dL — ABNORMAL LOW (ref 12.0–15.0)
MCH: 31.4 pg (ref 26.0–34.0)
MCHC: 32.3 g/dL (ref 30.0–36.0)
MCV: 97.3 fL (ref 80.0–100.0)
Platelets: 107 K/uL — ABNORMAL LOW (ref 150–400)
RBC: 2.96 MIL/uL — ABNORMAL LOW (ref 3.87–5.11)
RDW: 17 % — ABNORMAL HIGH (ref 11.5–15.5)
WBC: 7.4 K/uL (ref 4.0–10.5)
nRBC: 0.5 % — ABNORMAL HIGH (ref 0.0–0.2)

## 2024-09-21 LAB — BLOOD GAS, ARTERIAL
Acid-Base Excess: 5.1 mmol/L — ABNORMAL HIGH (ref 0.0–2.0)
Bicarbonate: 29.9 mmol/L — ABNORMAL HIGH (ref 20.0–28.0)
Delivery systems: POSITIVE
Drawn by: 21338
Expiratory PAP: 6 cmH2O
FIO2: 40 %
Inspiratory PAP: 18 cmH2O
O2 Saturation: 96.9 %
Patient temperature: 37.3
RATE: 20 {breaths}/min
pCO2 arterial: 45 mmHg (ref 32–48)
pH, Arterial: 7.44 (ref 7.35–7.45)
pO2, Arterial: 69 mmHg — ABNORMAL LOW (ref 83–108)

## 2024-09-21 LAB — BASIC METABOLIC PANEL WITH GFR
Anion gap: 11 (ref 5–15)
Anion gap: 12 (ref 5–15)
BUN: 47 mg/dL — ABNORMAL HIGH (ref 8–23)
BUN: 47 mg/dL — ABNORMAL HIGH (ref 8–23)
CO2: 29 mmol/L (ref 22–32)
CO2: 29 mmol/L (ref 22–32)
Calcium: 8.9 mg/dL (ref 8.9–10.3)
Calcium: 9.3 mg/dL (ref 8.9–10.3)
Chloride: 100 mmol/L (ref 98–111)
Chloride: 100 mmol/L (ref 98–111)
Creatinine, Ser: 2.04 mg/dL — ABNORMAL HIGH (ref 0.44–1.00)
Creatinine, Ser: 2.05 mg/dL — ABNORMAL HIGH (ref 0.44–1.00)
GFR, Estimated: 24 mL/min — ABNORMAL LOW (ref >60)
GFR, Estimated: 24 mL/min — ABNORMAL LOW (ref >60)
Glucose, Bld: 94 mg/dL (ref 70–99)
Glucose, Bld: 108 mg/dL — ABNORMAL HIGH (ref 70–99)
Potassium: 4.4 mmol/L (ref 3.5–5.1)
Potassium: 4.3 mmol/L (ref 3.5–5.1)
Sodium: 140 mmol/L (ref 135–145)
Sodium: 141 mmol/L (ref 135–145)

## 2024-09-21 LAB — VAS US ABI WITH/WO TBI
Left ABI: 0.94
Right ABI: 0.99

## 2024-09-21 LAB — GLUCOSE, CAPILLARY
Glucose-Capillary: 104 mg/dL — ABNORMAL HIGH (ref 70–99)
Glucose-Capillary: 109 mg/dL — ABNORMAL HIGH (ref 70–99)
Glucose-Capillary: 111 mg/dL — ABNORMAL HIGH (ref 70–99)
Glucose-Capillary: 60 mg/dL — ABNORMAL LOW (ref 70–99)
Glucose-Capillary: 77 mg/dL (ref 70–99)
Glucose-Capillary: 84 mg/dL (ref 70–99)
Glucose-Capillary: 88 mg/dL (ref 70–99)

## 2024-09-21 LAB — PHOSPHORUS: Phosphorus: 4.4 mg/dL (ref 2.5–4.6)

## 2024-09-21 MED ORDER — SODIUM CHLORIDE 3 % IN NEBU
4.0000 mL | INHALATION_SOLUTION | Freq: Four times a day (QID) | RESPIRATORY_TRACT | Status: DC
  Administered 2024-09-21 – 2024-09-23 (×11): 4 mL via RESPIRATORY_TRACT
  Filled 2024-09-21 (×11): qty 4

## 2024-09-21 MED ORDER — BUMETANIDE 0.25 MG/ML IJ SOLN: 1.0000 mg | Freq: Once | INTRAMUSCULAR | Status: DC

## 2024-09-21 MED ORDER — BUMETANIDE 0.25 MG/ML IJ SOLN
1.0000 mg | Freq: Once | INTRAMUSCULAR | Status: AC
  Administered 2024-09-21: 1 mg via INTRAVENOUS
  Filled 2024-09-21: qty 10
  Filled 2024-09-21: qty 4

## 2024-09-21 MED ORDER — BUMETANIDE 0.25 MG/ML IJ SOLN
1.0000 mg | Freq: Once | INTRAMUSCULAR | Status: AC
  Administered 2024-09-21: 1 mg via INTRAVENOUS
  Filled 2024-09-21: qty 10

## 2024-09-21 MED ORDER — AMIODARONE IV BOLUS ONLY 150 MG/100ML
150.0000 mg | Freq: Once | INTRAVENOUS | Status: AC
  Administered 2024-09-21: 150 mg via INTRAVENOUS
  Filled 2024-09-21: qty 100

## 2024-09-21 MED ORDER — DEXTROSE 50 % IV SOLN: 12.5000 g | Freq: Once | INTRAVENOUS | Status: AC

## 2024-09-21 MED ORDER — ALBUTEROL SULFATE (2.5 MG/3ML) 0.083% IN NEBU
2.5000 mg | INHALATION_SOLUTION | Freq: Four times a day (QID) | RESPIRATORY_TRACT | Status: DC
  Administered 2024-09-21 – 2024-09-23 (×11): 2.5 mg via RESPIRATORY_TRACT
  Filled 2024-09-21 (×11): qty 3

## 2024-09-21 MED ORDER — METHYLPREDNISOLONE SODIUM SUCC 40 MG IJ SOLR
40.0000 mg | INTRAMUSCULAR | Status: DC
Start: 2024-09-21 — End: 2024-09-26
  Administered 2024-09-21 – 2024-09-23 (×3): 40 mg via INTRAVENOUS
  Filled 2024-09-21 (×3): qty 1

## 2024-09-21 MED ORDER — DEXTROSE 50 % IV SOLN
INTRAVENOUS | Status: AC
  Administered 2024-09-21: 12.5 g via INTRAVENOUS
  Filled 2024-09-21: qty 50

## 2024-09-21 NOTE — Hospital Course (Signed)
 Tammy Ball is a 83 y.o. female with PMH of ***   Subjective: Seen and examined today Overnight ***afebrile, VSS-heart rate in 50s BP 130-150 systolic, Labs ***creatinine up 1.3> 1.6>2, platelet up 107 hb 9.3 stable VBG showed pH 7.15/pCO2 82 on 4L cannula, was restarted on amiodarone drip for rapid A-fib given IV fluid bolus for hypotension by critical care-and also altered mental status, chest pain episodes, needing Precedex drip to allow for BiPAP and levophed started ABG last night pH 7.2 pCO2 63- recheck pending ***   Assessment and plan: Principal Problem:   Septic shock (HCC)    Class *** 35-39.9: Class II Obesity w/ Body mass index is 26.09 kg/m.: Will benefit with PCP follow-up, weight loss,healthy lifestyle and outpatient sleep eval if not done.  Mobility: PT Orders:  PT Follow up Rec:    DVT prophylaxis:  Code Status:   Code Status: Full Code Family Communication: plan of care discussed with patient/*** at bedside. Patient status is: Remains hospitalized because of severity of illness Level of care: Stepdown   Dispo: The patient is from: *** and *** at baseline.            Anticipated disposition: TBD Objective: Vitals last 24 hrs: Vitals:   09/21/24 0400 09/21/24 0500 09/21/24 0600 09/21/24 0700  BP: 118/69 135/73 131/72 (!) 156/91  Pulse: (!) 54 60 (!) 57 (!) 54  Resp: 20 20 16  (!) 6  Temp: 98.4 F (36.9 C) 98.8 F (37.1 C) 99.1 F (37.3 C) 99.3 F (37.4 C)  TempSrc:      SpO2: 100% 99% 100% 100%  Weight:      Height:        Physical Examination: *** General exam: alert awake, oriented, older than stated age HEENT:Oral mucosa moist, Ear/Nose WNL grossly Respiratory system: Bilaterally clear BS,no use of accessory muscle Cardiovascular system: S1 & S2 +, No JVD. Gastrointestinal system: Abdomen soft,NT,ND, BS+ Nervous System: Alert, awake, moving all extremities,and following commands. Extremities: extremities warm, leg edema*** Skin: Warm, no  rashes MSK: Normal muscle bulk,tone, power   Medications reviewed: *** Scheduled Meds:  atorvastatin   40 mg Oral Daily   budesonide  (PULMICORT ) nebulizer solution  0.5 mg Nebulization BID   Chlorhexidine  Gluconate Cloth  6 each Topical Daily   cycloSPORINE  1 drop Both Eyes BID   insulin  aspart  0-9 Units Subcutaneous Q4H   lidocaine   1 patch Transdermal Q24H   metoprolol  tartrate  25 mg Oral BID   minocycline  200 mg Oral BID   pantoprazole  (PROTONIX ) IV  40 mg Intravenous Daily   QUEtiapine  50 mg Oral QHS   revefenacin  175 mcg Nebulization Daily   Continuous Infusions:  amiodarone 30 mg/hr (09/21/24 0723)   ampicillin-sulbactam (UNASYN) IV Stopped (09/21/24 0355)   dexmedetomidine (PRECEDEX) IV infusion 0.2 mcg/kg/hr (09/21/24 0416)   heparin  700 Units/hr (09/21/24 0416)   norepinephrine (LEVOPHED) Adult infusion 6 mcg/min (09/21/24 0416)   Diet: Diet Order             Diet regular Room service appropriate? Yes; Fluid consistency: Thin  Diet effective now

## 2024-09-21 NOTE — Progress Notes (Signed)
 NAME:  Tammy Ball, MRN:  [id number], DOB:  03-17-1941, LOS: 6 ADMISSION DATE:  09/15/2024, CONSULTATION DATE: 09/21/24 REFERRING MD:  MELODIE, CHIEF COMPLAINT:  shock, respiratory failure   History of Present Illness:  Patient is a 83 yo F w/ pertinent PMH lung cancer s/p lobectomy and cehmo, reneal cell carcinoma w/ left nephrectomy, diastolic chf, COPD on 2L [place] at home, thoracic spinal fracture involving T7, htn, hld, cad presents to [facility] on 11/4 w/ left sided chest pain.   Patient admitted last month for back. Imaging noted T7 compression spinal fracture. While in hospital patient developed worsening respiratory failure thought 2/2 to hypoventilation/atelectasis from severe pain requiring bipap. Completed abx for possible pna. IR performed kyphoplasty on 10/21 w/ improvement. Discharged to SNF for ongoing therapy.   On 11/3 patient developed cough w/ blood tinged sputum. On 11/4 patient woke up w/ acute left sided chest/back pain w/ sob. States she felt like she had fever and bodyaches. O2 had to be increased from 2 to 4L at facility. Brought to [facility] ED. On arrival patient temp 99.3 F and wbc 10. LA 1.9. BS w/ rhonchi/rales. BP soft. CXR w/ increased left basilar opacities and small left pleural effusion. Cultures obtained, given fluids, and started on rocephin /azithro. UA pending. D-dimer elevated 11.66 and trop 93. CTA chest showing multiple b/l segmental PE w/ evidence of R heart strain; RLL pleural based opacification; moderate consolidation over LLL w/ small left pleural effusion. Heparin  started. Despite iv fluids patient remained hypotensive requiring levo. Pccm consulted.  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   CHF (congestive heart failure) (HCC)    Claustrophobia    COPD (chronic obstructive pulmonary disease) (HCC)    Depression     Encounter for antineoplastic chemotherapy 02/09/2016   GERD (gastroesophageal reflux disease)    Headache    prior to menopause   Heart murmur    History of echocardiogram    Echo 2/17: EF 60-65%, grade 1 diastolic dysfunction, mild MR, trivial pericardial effusion   History of nuclear stress test    Myoview 2/17: no ischemia or scar, EF 79%; low risk   History of radiation therapy 10/30/16-11/06/16   Hyperlipidemia    Hypertension    Insomnia 05/16/2016   Myocardial infarction (HCC)    Non-small cell carcinoma of right lung, stage 2 (HCC) 10/03/2015   Oxygen  deficiency    Pneumonia    Radiation 02/29/16-04/10/16   50.4 Gy to right central chest   Renal cell carcinoma (HCC)    L nephrectomy  in 2012    Significant Hospital Events: Including procedures, antibiotic start and stop dates in addition to other pertinent events   11/4 admit w/ pna and pe on pressors 11/4 intubated, central venous access, on pressors-Levophed, on epoprostenol, 11/5 art line 11/6 oozig from art line, labile blood pressures and HR. C/f hemorrhagic shock w hgb 7.3 getting 2 PRBC  11/8 Extubated, Afib RVR briefly on amiodarone.  chest pain, started argatroban while heparin  is on hold, pending HIT antibodies 11/9 a. Fib with RVR restarted amiodarone, remained on vasopressors overnight, became acutely hypercapnic, placed on BiPAP with improvement in mental status.  Interim History / Subjective:  BiPAP 18/6/30% Drips: Precedex 0.2, levophed 4 mcg/min, amiodarone  Objective    Blood pressure 127/83, pulse (!) 52, temperature  98.8 F (37.1 C), resp. rate (!) 5, height 5' 3 (1.6 m), weight 66.8 kg, SpO2 98%.    FiO2 (%):  [30 %-40 %] 30 %   Intake/Output Summary (Last 24 hours) at 09/21/2024 0900 Last data filed at 09/21/2024 0831 Gross per 24 hour  Intake 1626.44 ml  Output 875 ml  Net 751.44 ml  I/O + 11.9 L since admission  Filed Weights   09/17/24 0500 09/18/24 0500 09/19/24 0704  Weight: 79.2  kg 63.3 kg 66.8 kg    Examination: General: more alert, following simple commands HENT: BiPAP on face, unable to assess oral cavity, PERRL Lungs: expiratory rhonchi and basilar crackles Cardiovascular: Distant heart sounds Abdomen: Soft, non-tender Extremities: 1+ pitting edema Neuro: as above GU: deferred  Resolved problem list   Assessment and Plan   #Shock: likely septic shock in the setting of A. Baumannii bacteremia. Reviewed echocardiogram and RV size and function is normal which rules out obstructive shock due to PE. - Wean levophed as tolerated - Abx plan per below - Target MAP > 65 mmHg  #Low Risk Pulmonary Embolism: - Heparin  drip - Appreciate anticoagulation pharmacy consult  #Atrial Fibrillation with RVR: - Amiodarone drip - AC as above - If RVR is a recurrent problem, can consider switching from NE to phenylephrine  to decrease chronotropic effect.  #Acute Hypoxic and Hypercapnic Respiratory Failure: likely due to pulmonary edema and COPD exacerbation. - Continue BiPAP - Continue albuterol  QID - Continue ICS/LAMA/LABA - Solumedrol 40 mg x 5 days - Diuresis plan: Bumex 1 mg IVP x 1  #Acinetobacter Baumannii Bacteremia: - S/p tobramycin  and Vancomycin  - Ampicillin sulbactam - Minocycline - Appreciate ID consult  #Stress Ulcer PPx: PPI  #Nutrition: NPO while on BiPAP. Can do sips/meds  #AKI: suspect component of cardiorenal etiology; other etiology is due to aminoglycoside administration. - Diuresis plan per above - Renally adjust medications - Avoid Nephrotoxins - Strict I/Os - Foley catheter  #VTE ppx: on heparin  drip.  #Acute Metabolic Encephalopathy: likely due to CO2 narcosis. Improved today. - Tx as above - Precedex for BiPAP tolerance  Disposition: ICU appropriate for shock and respiratory failure.  Labs   CBC: Recent Labs  Lab 09/17/24 1936 09/18/24 0433 09/18/24 0816 09/19/24 0541 09/20/24 0741 09/21/24 0500  WBC 10.0 8.8  --   10.6* 9.8 7.4  HGB 10.9* 10.0*  --  9.8* 9.8* 9.3*  HCT 31.8* 29.6*  --  28.3* 31.2* 28.8*  MCV 90.3 90.5  --  90.7 97.8 97.3  PLT 85* 68* 68* 72* 86* 107*    Basic Metabolic Panel: Recent Labs  Lab 09/17/24 1712 09/18/24 0816 09/19/24 1026 09/19/24 1256 09/20/24 0741 09/21/24 0500  NA 135 136  --  140 139 140  K 3.5 3.3*  --  2.9* 4.8 4.4  CL 99 98  --  99 101 100  CO2 26 29  --  30 28 29   GLUCOSE 139* 130*  --  121* 101* 94  BUN 32* 37*  --  35* 37* 47*  CREATININE 1.41* 1.46*  --  1.39* 1.61* 2.04*  CALCIUM  8.2* 8.3*  --  8.7* 9.1 8.9  MG  --   --  2.3  --  2.4 2.2  PHOS  --   --   --   --   --  4.4   GFR: Estimated Creatinine Clearance: 19.9 mL/min (A) (by C-G formula based on SCr of 2.04 mg/dL (H)). Recent Labs  Lab 09/15/24 1007 09/15/24 1008 09/15/24  1253 09/15/24 2210 09/16/24 0440 09/17/24 0744 09/17/24 1434 09/18/24 0433 09/19/24 0541 09/20/24 0741 09/21/24 0500  WBC  --    < >  --   --    < > 9.2   < > 8.8 10.6* 9.8 7.4  LATICACIDVEN 1.9  --  11.1* 1.3  --  2.9*  --   --   --   --   --    < > = values in this interval not displayed.    Liver Function Tests: Recent Labs  Lab 09/16/24 0440 09/17/24 0744 09/19/24 1256  AST 23 14* 19  ALT 13 10 14   ALKPHOS 60 54 75  BILITOT 0.9 0.9 0.4  PROT 4.9* 4.2* 5.4*  ALBUMIN  2.9* 2.2* 2.8*   Recent Labs  Lab 09/19/24 1256  LIPASE 38   No results for input(s): AMMONIA in the last 168 hours.  ABG    Component Value Date/Time   PHART 7.44 09/21/2024 0759   PCO2ART 45 09/21/2024 0759   PO2ART 69 (L) 09/21/2024 0759   HCO3 29.9 (H) 09/21/2024 0759   TCO2 30 09/16/2024 2216   ACIDBASEDEF 1.5 09/20/2024 2038   O2SAT 96.9 09/21/2024 0759     Coagulation Profile: Recent Labs  Lab 09/15/24 1008 09/17/24 0214 09/17/24 0744 09/18/24 0816  INR 0.9 1.3* 1.0 1.0    Cardiac Enzymes: No results for input(s): CKTOTAL, CKMB, CKMBINDEX, TROPONINI in the last 168 hours.  HbA1C: Hgb A1c  MFr Bld  Date/Time Value Ref Range Status  09/16/2024 04:40 AM 5.8 (H) 4.8 - 5.6 % Final    Comment:    (NOTE) Diagnosis of Diabetes The following HbA1c ranges recommended by the American Diabetes Association (ADA) may be used as an aid in the diagnosis of diabetes mellitus.  Hemoglobin             Suggested A1C NGSP%              Diagnosis  <5.7                   Non Diabetic  5.7-6.4                Pre-Diabetic  >6.4                   Diabetic  <7.0                   Glycemic control for                       adults with diabetes.      CBG: Recent Labs  Lab 09/20/24 1609 09/20/24 1948 09/20/24 2339 09/21/24 0346 09/21/24 0802  GLUCAP 92 103* 121* 109* 77    Review of Systems:   Not obtained.  Past Medical History:  She,  has a past medical history of Anemia, Anxiety, Arthritis, Asthma, Chemotherapy-induced neuropathy (11/01/2015), CHF (congestive heart failure) (HCC), Claustrophobia, COPD (chronic obstructive pulmonary disease) (HCC), Depression, Encounter for antineoplastic chemotherapy (02/09/2016), GERD (gastroesophageal reflux disease), Headache, Heart murmur, History of echocardiogram, History of nuclear stress test, History of radiation therapy (10/30/16-11/06/16), Hyperlipidemia, Hypertension, Insomnia (05/16/2016), Myocardial infarction [name]), Non-small cell carcinoma of right lung, stage 2 (HCC) (10/03/2015), Oxygen  deficiency, Pneumonia, Radiation (02/29/16-04/10/16), and Renal cell carcinoma (HCC).   Surgical History:   Past Surgical History:  Procedure Laterality Date   BACK SURGERY     cervical 1991   BIOPSY  11/15/2022   Procedure: BIOPSY;  Surgeon:  Cirigliano, Sandor GAILS, DO;  Location: [facility];  Service: Gastroenterology;;   COLONOSCOPY WITH PROPOFOL  N/A 11/15/2022   Procedure: COLONOSCOPY WITH PROPOFOL ;  Surgeon: San Sandor GAILS, DO;  Location: [facility];  Service: Gastroenterology;  Laterality: N/A;   ESOPHAGOGASTRODUODENOSCOPY (EGD) WITH  PROPOFOL  N/A 11/15/2022   Procedure: ESOPHAGOGASTRODUODENOSCOPY (EGD) WITH PROPOFOL ;  Surgeon: San Sandor GAILS, DO;  Location: [facility];  Service: Gastroenterology;  Laterality: N/A;   EYE SURGERY     IR GENERIC HISTORICAL  01/16/2017   IR RADIOLOGIST EVAL & MGMT 01/16/2017 [facility]   IR GENERIC HISTORICAL  01/31/2017   IR FLUORO GUIDED NEEDLE PLC ASPIRATION/INJECTION LOC 01/31/2017 [name], MD [facility]   IR GENERIC HISTORICAL  01/31/2017   IR VERTEBROPLASTY CERV/THOR BX INC UNI/BIL INC/INJECT/IMAGING 01/31/2017 [name], MD [facility]   IR GENERIC HISTORICAL  01/31/2017   IR VERTEBROPLASTY EA ADDL (T&LS) BX INC UNI/BIL INC INJECT/IMAGING 01/31/2017 [name], MD [facility]   IR KYPHO THORACIC WITH BONE BIOPSY  09/01/2024   kidney cancer     LOBECTOMY Right    RUL   NEPHRECTOMY  2012   POLYPECTOMY  11/15/2022   Procedure: POLYPECTOMY;  Surgeon: San Sandor GAILS, DO;  Location: [facility];  Service: Gastroenterology;;   SPINE SURGERY     TUBAL LIGATION     VIDEO ASSISTED THORACOSCOPY (VATS)/WEDGE RESECTION Right 01/18/2016   Procedure: VIDEO ASSISTED THORACOSCOPY (VATS)/LUNG RESECTION, THOROCOTOMY, RIGHT UPPER LOBECTOMY, LYMPH NODE DISSECTION, PLACEMENT OF ON Q;  Surgeon: [name], MD;  Location: [facility] OR;  Service: Thoracic;  Laterality: Right;   VIDEO BRONCHOSCOPY Bilateral 09/20/2015   Procedure: VIDEO BRONCHOSCOPY WITHOUT FLUORO;  Surgeon: [name], MD;  Location: [facility];  Service: Cardiopulmonary;  Laterality: Bilateral;   VIDEO BRONCHOSCOPY N/A 01/18/2016   Procedure: VIDEO BRONCHOSCOPY;  Surgeon: [name], MD;  Location: [facility] OR;  Service: Thoracic;  Laterality: N/A;     Social History:   reports that she quit smoking about 13 years ago. Her smoking use included cigarettes. She started smoking about 63 years ago. She has a 50 pack-year smoking history. She has never used smokeless tobacco. She reports that she  does not drink alcohol  and does not use drugs.   Family History:  Her family history includes Birth defects in her sister; Breast cancer in her sister; CAD (age of onset: 51) in her mother; Cancer in her maternal grandmother and mother; Glaucoma in her daughter; Heart attack (age of onset: 24) in her daughter; Heart disease in her sister; Hypertension in her mother; Obesity in her brother. There is no history of Colon cancer, Colon polyps, Esophageal cancer, Rectal cancer, or Stomach cancer.   Allergies Allergies  Allergen Reactions   Bee Venom Anaphylaxis, Shortness Of Breath, Swelling and Other (See Comments)    Swelling at site (reaction to bees and wasps)   Amlodipine  Swelling and Other (See Comments)    Swelling of the ankles and hands    Levofloxacin Other (See Comments)    Joint pain    Alendronate  Other (See Comments)    Joint pains / hypercalcemia    Hctz [Hydrochlorothiazide ] Palpitations and Other (See Comments)    Sweating      Home Medications  Prior to Admission medications   Medication Sig Start Date End Date Taking? Authorizing Provider  acetaminophen  (TYLENOL ) 500 MG tablet Take 500 mg by mouth every 6 (six) hours as needed for moderate pain (pain score 4-6) (or headaches).   Yes [provider]  albuterol  (PROVENTIL ) (2.5 MG/3ML) 0.083% nebulizer solution INHALE 3 ML BY NEBULIZATION EVERY 6 HOURS AS NEEDED FOR WHEEZING OR SHORTNESS OF BREATH 04/30/24  Yes Byrum, Robert S, MD  alum & mag hydroxide-simeth (MAALOX PLUS) 400-400-40 MG/5ML suspension Take 10 mLs by mouth every 6 (six) hours as needed for indigestion (or gas/nausea).   Yes [provider]  ARTIFICIAL TEAR SOLUTION OP Place 1 drop into both eyes every 2 (two) hours as needed (for dryness).   Yes [provider]  ascorbic acid  (VITAMIN C) 500 MG tablet Take 500 mg by mouth 2 (two) times daily.   Yes [provider]  aspirin  EC 81 MG tablet Take 81 mg by mouth every morning.    Yes [provider]  atorvastatin  (LIPITOR) 40 MG tablet TAKE 1 TABLET BY MOUTH EVERY DAY Patient taking differently: Take 40 mg by mouth at bedtime. 02/02/24  Yes [name], [name], MD  bisacodyl  (DULCOLAX) 10 MG suppository Place 10 mg rectally daily as needed (for constipation- if no relief from Milk of Magnesia).   Yes [provider]  budesonide -glycopyrrolate -formoterol  (BREZTRI  AEROSPHERE) 160-9-4.8 MCG/ACT AERO inhaler Inhale 2 puffs into the lungs in the morning and at bedtime. 04/29/24  Yes [name], MD  calcium  carbonate (TUMS - DOSED IN MG ELEMENTAL CALCIUM ) 500 MG chewable tablet Chew 1 tablet by mouth every 6 (six) hours as needed for heartburn.   Yes [provider]  carvedilol  (COREG ) 25 MG tablet Take 1 tablet (25 mg total) by mouth 2 (two) times daily. 04/15/24  Yes [name], [name], NP  cholecalciferol  (VITAMIN D3) 25 MCG (1000 UNIT) tablet Take 1,000 Units by mouth daily.   Yes [provider]  cycloSPORINE (RESTASIS) 0.05 % ophthalmic emulsion Place 1 drop into both eyes every 12 (twelve) hours. 08/02/24  Yes [provider]  furosemide  (LASIX ) 20 MG tablet Take 1 tablet (20 mg total) by mouth daily. 09/03/24  Yes Lue Elsie BROCKS, MD  Guaifenesin  (MUCINEX  MAXIMUM STRENGTH) 1200 MG TB12 Take 1,200 mg by mouth every morning.   Yes [provider]  loperamide  (IMODIUM ) 2 MG capsule Take 1 capsule (2 mg total) by mouth 4 (four) times daily as needed for diarrhea or loose stools. Patient taking differently: Take 2 mg by mouth every 6 (six) hours as needed for diarrhea or loose stools. 08/19/23  Yes [name], [name], MD  losartan  (COZAAR ) 100 MG tablet TAKE 1 TABLET BY MOUTH EVERY DAY 02/17/24  Yes [name], [name], MD  MILK OF MAGNESIA 1200 MG/15ML suspension Take 30 mLs by mouth daily as needed for mild constipation (if no bowel movement after 3 days).   Yes [provider]  Nutritional Supplements  (NUTRITIONAL DRINK PO) Take 120 mLs by mouth See admin instructions. Sugar-free MedPass - Drink 120 ml's by mouth at 10 AM and 6 PM   Yes [provider]  Omega-3 Fatty Acids  (FISH OIL) 1000 MG CAPS Take 1,000 mg by mouth in the morning.   Yes [provider]  ondansetron  (ZOFRAN -ODT) 4 MG disintegrating tablet 4mg  ODT q4 hours prn nausea/vomit Patient taking differently: Take 4 mg by mouth every 4 (four) hours as needed for nausea or vomiting (dissolve orally). 08/22/24  Yes Zammit, Joseph, MD  OXYGEN  Inhale 2 L/min into the lungs continuous.   Yes [provider]  pantoprazole  (PROTONIX ) 40 MG tablet Take 40 mg by mouth in the morning.   Yes [provider]  polyethylene glycol (MIRALAX  / GLYCOLAX )  17 g packet Take 17 g by mouth 2 (two) times daily. 09/03/24  Yes Lue Elsie BROCKS, MD  predniSONE  (DELTASONE ) 10 MG tablet Take 10-20 mg by mouth See admin instructions. Take 20 mg by mouth with food for 5 days, then decrease to 10 mg once a day for 5 days 09/08/24  Yes [provider]  senna-docusate (SENOKOT-S) 8.6-50 MG tablet Take 1 tablet by mouth 2 (two) times daily. 09/03/24  Yes Lue Elsie BROCKS, MD  sodium chloride  (OCEAN) 0.65 % SOLN nasal spray Place 1 spray into both nostrils at bedtime as needed for congestion.   Yes [provider]  sodium phosphate  (FLEET) ENEM Place 1 enema rectally daily as needed (if no relief from Dulcolax suppository).   Yes [provider]  traMADol  (ULTRAM ) 50 MG tablet Take 1 tablet (50 mg total) by mouth every 6 (six) hours as needed. Patient taking differently: Take 50 mg by mouth every 6 (six) hours as needed (for pain). 09/03/24  Yes Lue Elsie BROCKS, MD  traZODone  (DESYREL ) 50 MG tablet Take 0.5-1 tablets (25-50 mg total) by mouth at bedtime as needed for sleep. Patient taking differently: Take 25 mg by mouth at bedtime as needed for sleep. 09/03/24  Yes Lue Elsie BROCKS, MD   albuterol  (VENTOLIN  HFA) 108 [phone] Base) MCG/ACT inhaler Inhale 1-2 puffs into the lungs every 6 (six) hours as needed for wheezing or shortness of breath. Patient not taking: Reported on 09/15/2024 04/29/24   [name], MD     Due to a high probability of clinically significant, life threatening deterioration, the patient required my highest level of preparedness to intervene emergently and I personally spent this critical care time directly and personally managing the patient. This critical care time included obtaining a history; examining the patient; pulse oximetry; ordering and review of studies; arranging urgent treatment with development of a management plan; evaluation of patient's response to treatment; frequent reassessment; and, discussions with other providers.  This critical care time was performed to assess and manage the high probability of imminent, life-threatening deterioration that could result in multi-organ failure. It was exclusive of separately billable procedures and treating other patients and teaching time. Critical Care Time: 60 minutes.  Paula Southerly, MD [facility]

## 2024-09-21 NOTE — Progress Notes (Signed)
 VASCULAR LAB    ABI has been performed.  See CV proc for preliminary results.   Homar Weinkauf, RVT 09/21/2024, 10:32 AM

## 2024-09-21 NOTE — Progress Notes (Signed)
 Nutrition Follow-up  DOCUMENTATION CODES:   Not applicable  INTERVENTION:  - If unable to advance diet and/or if patient unable to come off BiPAP, would recommend placement of post-pyloric Cortrak and initiation of tube feeds.   NUTRITION DIAGNOSIS:   Inadequate oral intake related to inability to eat as evidenced by NPO status. *ongoing  GOAL:   Patient will meet greater than or equal to 90% of their needs *progressing  MONITOR:   Vent status, Labs, Weight trends, TF tolerance  REASON FOR ASSESSMENT:   Consult Enteral/tube feeding initiation and management (trickle tube feeds)  ASSESSMENT:   83 yo F w/ pertinent PMH lung cancer s/p lobectomy and cehmo, reneal cell carcinoma w/ left nephrectomy, diastolic CHF, COPD on 2L [place] at home, HTN, HLD, CAD who presented with left sided chest pain. Admitted for septic shock d/t PNA and PE.  11/4 Admit; Intubated 11/5 Trickle TF initiated 11/8 Extubated; Regular diet 11/10 BiPAP; NPO  Patient on BiPAP at time of visit. She was initially on a regular diet this AM before requiring BIPAP.  Per discussion with RN at bedside, patient has not been eating the past 2 days. Her sugar has now been low. CCM expecting patient to require BiPAP at least for the remainder of today.   Discussed patient with CCM MD. As patient has had minimal intake since admit 6 days ago (on received trickle TF and then minimal/no oral intake) and had poor intake PTA, would recommend placement of Cortrak if unable to come off BiPAP by tomorrow. Would recommend post-pyloric placement if patient continues to require BiPAP.    Admit weight: 152# Current weight: 147# I&O's: +12.1L since admit + for mild pitting generalized edema, mild pitting RLE/LLE edema, and non-pitting RUE/LUE edema   Medications reviewed and include: Protonix  Precedex Levophed @ 63mcg/min   Labs reviewed:  Creatinine 2.04  Diet Order:   Diet Order             Diet NPO time specified  Except for: Ice Chips, Sips with Meds  Diet effective now                   EDUCATION NEEDS:  Education needs have been addressed  Skin:  Skin Assessment: Skin Integrity Issues: Skin Integrity Issues:: Stage II Stage II: Sacrum  Last BM:  11/8  Height:  Ht Readings from Last 1 Encounters:  09/15/24 5' 3 (1.6 m)   Weight:  Wt Readings from Last 1 Encounters:  09/19/24 66.8 kg   Ideal Body Weight:  52.27 kg  BMI:  Body mass index is 26.09 kg/m.  Estimated Nutritional Needs:  Kcal:  1600-1800 kcals Protein:  75-85 grams Fluid:  >/= 1.6L    Trude Ned RD, LDN Contact via Secure Chat.

## 2024-09-21 NOTE — Progress Notes (Signed)
 PHARMACY - ANTICOAGULATION CONSULT NOTE  Pharmacy Consult for Heparin  Indication: pulmonary embolus + afib  Allergies  Allergen Reactions   Bee Venom Anaphylaxis, Shortness Of Breath, Swelling and Other (See Comments)    Swelling at site (reaction to bees and wasps)   Amlodipine  Swelling and Other (See Comments)    Swelling of the ankles and hands    Levofloxacin Other (See Comments)    Joint pain    Alendronate  Other (See Comments)    Joint pains / hypercalcemia    Hctz [Hydrochlorothiazide ] Palpitations and Other (See Comments)    Sweating     Patient Measurements: Height: 5' 3 (160 cm) Weight: 66.8 kg (147 lb 4.3 oz) IBW/kg (Calculated) : 52.4 HEPARIN  DW (KG): 67.1  Vital Signs: Temp: 98.8 F (37.1 C) (11/10 0845) BP: 127/83 (11/10 0845) Pulse Rate: 52 (11/10 0845)  Labs: Recent Labs    09/19/24 0541 09/19/24 1256 09/19/24 1548 09/20/24 0159 09/20/24 0741 09/20/24 1200 09/21/24 0500  HGB 9.8*  --   --   --  9.8*  --  9.3*  HCT 28.3*  --   --   --  31.2*  --  28.8*  PLT 72*  --   --   --  86*  --  107*  HEPARINUNFRC 0.26*  --    < > 0.82*  --  0.69 0.62  CREATININE  --  1.39*  --   --  1.61*  --  2.04*   < > = values in this interval not displayed.    Estimated Creatinine Clearance: 19.9 mL/min (A) (by C-G formula based on SCr of 2.04 mg/dL (H)).   AC/Heme: UFH for B PE w/ R heart strain, ddimer 11.66. +Afib - 11/6 Heparin  held for anemia, bleeding from Aline R groin. CT angio GIB w c/f possible pseudoaneurysm and some bleeding from arterial access. D/c Aline - 11/7: Hgb 10 down, Plts 68 down. Groin hematoma improved per MD. Resume Heparin  11/7 HIT antibody: WNL  09/21/2024 Heparin  level = 0.62 - therapeutic on heparin  drip @ 700 units/hr Hg 9.3 - stable PLT 107> low but improved No bleeding reported  Goal of Therapy:  Heparin  level 0.3-0.7 units/ml Monitor platelets by anticoagulation protocol: Yes   Plan: Con't IV heparin  at 700 units/hr Daily  heparin  level and CBC F/u transition to DOAC when able to take PO meds (currently on BiPAP)   Rosaline IVAR Edison, Pharm.D Use secure chat for questions 09/21/2024 10:34 AM

## 2024-09-21 NOTE — Plan of Care (Signed)

## 2024-09-21 NOTE — Progress Notes (Signed)
 Pt taken off BiPAP and placed on 4L nasal cannula vitals remain stable, RN aware.

## 2024-09-22 DIAGNOSIS — J189 Pneumonia, unspecified organism: Secondary | ICD-10-CM

## 2024-09-22 DIAGNOSIS — R6521 Severe sepsis with septic shock: Secondary | ICD-10-CM | POA: Diagnosis not present

## 2024-09-22 DIAGNOSIS — J9601 Acute respiratory failure with hypoxia: Secondary | ICD-10-CM | POA: Diagnosis not present

## 2024-09-22 DIAGNOSIS — A4154 Sepsis due to Acinetobacter baumannii: Principal | ICD-10-CM

## 2024-09-22 DIAGNOSIS — I4891 Unspecified atrial fibrillation: Secondary | ICD-10-CM | POA: Diagnosis not present

## 2024-09-22 DIAGNOSIS — J441 Chronic obstructive pulmonary disease with (acute) exacerbation: Secondary | ICD-10-CM

## 2024-09-22 LAB — GLUCOSE, CAPILLARY
Glucose-Capillary: 93 mg/dL (ref 70–99)
Glucose-Capillary: 123 mg/dL — ABNORMAL HIGH (ref 70–99)
Glucose-Capillary: 105 mg/dL — ABNORMAL HIGH (ref 70–99)
Glucose-Capillary: 87 mg/dL (ref 70–99)
Glucose-Capillary: 95 mg/dL (ref 70–99)
Glucose-Capillary: 143 mg/dL — ABNORMAL HIGH (ref 70–99)

## 2024-09-22 LAB — BLOOD GAS, ARTERIAL
Acid-base deficit: 0.6 mmol/L (ref 0.0–2.0)
Bicarbonate: 27.8 mmol/L (ref 20.0–28.0)
Delivery systems: POSITIVE
Drawn by: 74501
Expiratory PAP: 5 cmH2O
FIO2: 30 %
Inspiratory PAP: 12 cmH2O
O2 Saturation: 83.3 %
Patient temperature: 37
RATE: 20 {breaths}/min
pCO2 arterial: 62 mmHg — ABNORMAL HIGH (ref 32–48)
pH, Arterial: 7.26 — ABNORMAL LOW (ref 7.35–7.45)
pO2, Arterial: 47 mmHg — ABNORMAL LOW (ref 83–108)

## 2024-09-22 LAB — CBC
HCT: 32.9 % — ABNORMAL LOW (ref 36.0–46.0)
Hemoglobin: 10.3 g/dL — ABNORMAL LOW (ref 12.0–15.0)
MCH: 31.3 pg (ref 26.0–34.0)
MCHC: 31.3 g/dL (ref 30.0–36.0)
MCV: 100 fL (ref 80.0–100.0)
Platelets: 146 K/uL — ABNORMAL LOW (ref 150–400)
RBC: 3.29 MIL/uL — ABNORMAL LOW (ref 3.87–5.11)
RDW: 17.3 % — ABNORMAL HIGH (ref 11.5–15.5)
WBC: 8.7 K/uL (ref 4.0–10.5)
nRBC: 0.3 % — ABNORMAL HIGH (ref 0.0–0.2)

## 2024-09-22 LAB — HEPARIN LEVEL (UNFRACTIONATED)
Heparin Unfractionated: 1.06 [IU]/mL — ABNORMAL HIGH (ref 0.30–0.70)
Heparin Unfractionated: 1.09 [IU]/mL — ABNORMAL HIGH (ref 0.30–0.70)
Heparin Unfractionated: 0.42 [IU]/mL (ref 0.30–0.70)

## 2024-09-22 LAB — BASIC METABOLIC PANEL WITH GFR
Anion gap: 11 (ref 5–15)
BUN: 53 mg/dL — ABNORMAL HIGH (ref 8–23)
CO2: 30 mmol/L (ref 22–32)
Calcium: 9.3 mg/dL (ref 8.9–10.3)
Chloride: 101 mmol/L (ref 98–111)
Creatinine, Ser: 2.51 mg/dL — ABNORMAL HIGH (ref 0.44–1.00)
GFR, Estimated: 19 mL/min — ABNORMAL LOW (ref >60)
Glucose, Bld: 110 mg/dL — ABNORMAL HIGH (ref 70–99)
Potassium: 4.7 mmol/L (ref 3.5–5.1)
Sodium: 141 mmol/L (ref 135–145)

## 2024-09-22 LAB — MAGNESIUM: Magnesium: 2.4 mg/dL (ref 1.7–2.4)

## 2024-09-22 LAB — PHOSPHORUS: Phosphorus: 6.5 mg/dL — ABNORMAL HIGH (ref 2.5–4.6)

## 2024-09-22 MED ORDER — HEPARIN (PORCINE) 25000 UT/250ML-% IV SOLN
550.0000 [IU]/h | INTRAVENOUS | Status: DC
  Administered 2024-09-22 – 2024-09-23 (×2): 550 [IU]/h via INTRAVENOUS
  Filled 2024-09-22 (×2): qty 250

## 2024-09-22 MED ORDER — ORAL CARE MOUTH RINSE
15.0000 mL | OROMUCOSAL | Status: DC
  Administered 2024-09-22 – 2024-09-23 (×6): 15 mL via OROMUCOSAL

## 2024-09-22 MED ORDER — ORAL CARE MOUTH RINSE: 15.0000 mL | OROMUCOSAL | Status: DC | PRN

## 2024-09-22 MED ORDER — QUETIAPINE FUMARATE 50 MG PO TABS
25.0000 mg | ORAL_TABLET | Freq: Every day | ORAL | Status: DC
  Administered 2024-09-23: 25 mg via ORAL
  Filled 2024-09-22: qty 1

## 2024-09-22 MED ORDER — FUROSEMIDE 10 MG/ML IJ SOLN
80.0000 mg | Freq: Once | INTRAMUSCULAR | Status: AC
  Administered 2024-09-22: 80 mg via INTRAVENOUS
  Filled 2024-09-22: qty 8

## 2024-09-22 MED ORDER — BOOST / RESOURCE BREEZE PO LIQD CUSTOM
1.0000 | Freq: Three times a day (TID) | ORAL | Status: DC
  Administered 2024-09-22 – 2024-09-23 (×3): 1 via ORAL

## 2024-09-22 MED ORDER — METOPROLOL TARTRATE 5 MG/5ML IV SOLN
2.5000 mg | INTRAVENOUS | Status: DC | PRN
  Administered 2024-09-22: 2.5 mg via INTRAVENOUS
  Filled 2024-09-22: qty 5

## 2024-09-22 MED ORDER — ALBUMIN HUMAN 25 % IV SOLN
25.0000 g | Freq: Once | INTRAVENOUS | Status: AC
  Administered 2024-09-22: 25 g via INTRAVENOUS
  Filled 2024-09-22: qty 100

## 2024-09-22 NOTE — Progress Notes (Signed)
 PHARMACY - ANTICOAGULATION CONSULT NOTE  Pharmacy Consult for Heparin  Indication: pulmonary embolus + afib  Allergies  Allergen Reactions   Bee Venom Anaphylaxis, Shortness Of Breath, Swelling and Other (See Comments)    Swelling at site (reaction to bees and wasps)   Amlodipine  Swelling and Other (See Comments)    Swelling of the ankles and hands    Levofloxacin Other (See Comments)    Joint pain    Alendronate  Other (See Comments)    Joint pains / hypercalcemia    Hctz [Hydrochlorothiazide ] Palpitations and Other (See Comments)    Sweating     Patient Measurements: Height: 5' 3 (160 cm) Weight: 64.8 kg (142 lb 13.7 oz) IBW/kg (Calculated) : 52.4 HEPARIN  DW (KG): 67.1  Vital Signs: Temp: 97.3 F (36.3 C) (11/11 0700) BP: 133/65 (11/11 0700) Pulse Rate: 68 (11/11 0700)  Labs: Recent Labs    09/20/24 0741 09/20/24 1200 09/21/24 0500 09/21/24 1403 09/22/24 0500  HGB 9.8*  --  9.3*  --  10.3*  HCT 31.2*  --  28.8*  --  32.9*  PLT 86*  --  107*  --  146*  HEPARINUNFRC  --  0.69 0.62  --  1.06*  CREATININE 1.61*  --  2.04* 2.05* 2.51*    Estimated Creatinine Clearance: 15.9 mL/min (A) (by C-G formula based on SCr of 2.51 mg/dL (H)).   AC/Heme: UFH for B PE w/ R heart strain, ddimer 11.66. +Afib - 11/6 Heparin  held for anemia, bleeding from Aline R groin. CT angio GIB w c/f possible pseudoaneurysm and some bleeding from arterial access. D/c Aline - 11/7: Hgb 10 down, Plts 68 down. Groin hematoma improved per MD. Resume Heparin  11/7 HIT antibody: WNL  09/22/2024 Heparin  level = 1.06- supra-therapeutic on heparin  drip @ 700 units/hr> heparin  level drawn via central line & heparin  infusing via central line RN changed heparin  drip to peripheral IV site Redraw heparin  level via central line: 1.09 - remains above goal Level drawn from Left IJ central line & heparin  infusing in the left peripheral IV> still could be some contamination from heparin  drip Hg 10.3 - stable  PLT 147 up from low of 68 No bleeding reported  Goal of Therapy:  Heparin  level 0.3-0.7 units/ml Monitor platelets by anticoagulation protocol: Yes   Plan:  Hold heparin  drip x 1 hour then resume at decrease rate of 550 units/hr Check heparin  level 8 hours after resumed with peripheral stick Daily heparin  level and CBC F/u transition to DOAC when able to take PO meds   Rosaline IVAR Edison, Pharm.D Use secure chat for questions 09/22/2024 7:26 AM

## 2024-09-22 NOTE — Progress Notes (Signed)
   09/22/24 0909  BiPAP/CPAP/SIPAP  BiPAP/CPAP/SIPAP Pt Type Adult  BiPAP/CPAP/SIPAP V60  Mask Type Full face mask  Mask Size Medium  Set Rate 20 breaths/min  Respiratory Rate 25 breaths/min  IPAP 18 cmH20  EPAP 6 cmH2O  FiO2 (%) 40 %  Minute Ventilation 9.4  Leak 4  Peak Inspiratory Pressure (PIP) 18  Tidal Volume (Vt) 378  Patient Home Machine No  Patient Home Mask No  Patient Home Tubing No  Auto Titrate No  Press High Alarm 30 cmH2O  Press Low Alarm 5 cmH2O  CPAP/SIPAP surface wiped down Yes  Device Plugged into RED Power Outlet Yes  BiPAP/CPAP /SiPAP Vitals  Temp (!) 97.5 F (36.4 C)  Pulse Rate 65  Resp (!) 25  SpO2 94 %  MEWS Score/Color  MEWS Score 1  MEWS Score Color Green   Placed patient on BiPAP at this time due to patient being lethargic. RN made aware.

## 2024-09-22 NOTE — Progress Notes (Addendum)
 Spoke to infectious disease  Recs via phone per Dr Delaine  Continue both Unasyn and Minocycline with plan to complete 14d course. Based on final culture data could consider stopping minocycline but w/ sensitives still pending continue this plan at this point    Tammy Ball Banner ACNP-BC [facility] Pulmonary/Critical Care Pager # [phone] OR # [phone] if no answer

## 2024-09-22 NOTE — Progress Notes (Signed)
 eLink Physician-Brief Progress Note [name] DOB: [birth year] MRN: [id number]   Date of Service  09/22/2024  HPI/Events of Note  Patient has scheduled oral Metoprolol  and Minocycline but is NPO.  eICU Interventions  Order entered to hold medications over night.        Tammy Ball U Tammy Ball 09/22/2024, 10:40 PM

## 2024-09-22 NOTE — Progress Notes (Signed)
 PHARMACY - ANTICOAGULATION CONSULT NOTE  Pharmacy Consult for Heparin  Indication: pulmonary embolus + afib  Allergies  Allergen Reactions   Bee Venom Anaphylaxis, Shortness Of Breath, Swelling and Other (See Comments)    Swelling at site (reaction to bees and wasps)   Amlodipine  Swelling and Other (See Comments)    Swelling of the ankles and hands    Levofloxacin Other (See Comments)    Joint pain    Alendronate  Other (See Comments)    Joint pains / hypercalcemia    Hctz [Hydrochlorothiazide ] Palpitations and Other (See Comments)    Sweating     Patient Measurements: Height: 5' 3 (160 cm) Weight: 64.8 kg (142 lb 13.7 oz) IBW/kg (Calculated) : 52.4 HEPARIN  DW (KG): 67.1  Vital Signs: Temp: 98.4 F (36.9 C) (11/11 2038) Temp Source: Bladder (11/11 1200) BP: 116/67 (11/11 1900) Pulse Rate: 104 (11/11 1900)  Labs: Recent Labs    09/20/24 0741 09/20/24 1200 09/21/24 0500 09/21/24 1403 09/22/24 0500 09/22/24 0927 09/22/24 2053  HGB 9.8*  --  9.3*  --  10.3*  --   --   HCT 31.2*  --  28.8*  --  32.9*  --   --   PLT 86*  --  107*  --  146*  --   --   HEPARINUNFRC  --    < > 0.62  --  1.06* 1.09* 0.42  CREATININE 1.61*  --  2.04* 2.05* 2.51*  --   --    < > = values in this interval not displayed.    Estimated Creatinine Clearance: 15.9 mL/min (A) (by C-G formula based on SCr of 2.51 mg/dL (H)).   AC/Heme: UFH for B PE w/ R heart strain, ddimer 11.66. +Afib - 11/6 Heparin  held for anemia, bleeding from Aline R groin. CT angio GIB w c/f possible pseudoaneurysm and some bleeding from arterial access. D/c Aline - 11/7: Hgb 10 down, Plts 68 down. Groin hematoma improved per MD. Resume Heparin  11/7 HIT antibody: WNL  09/22/2024 Heparin  level = 1.06- supra-therapeutic on heparin  drip @ 700 units/hr> heparin  level drawn via central line & heparin  infusing via central line RN changed heparin  drip to peripheral IV site Redraw heparin  level via central line: 1.09 - remains  above goal Level drawn from Left IJ central line & heparin  infusing in the left peripheral IV> still could be some contamination from heparin  drip Hg 10.3 - stable PLT 147 up from low of 68 No bleeding reported  2053 HL 0.42 therapeutic on 550 units/hr (peripheral stick) Per RN no bleeding  Goal of Therapy:  Heparin  level 0.3-0.7 units/ml Monitor platelets by anticoagulation protocol: Yes   Plan:  Continue heparin  drip at 550 units/hr Heparin  level in am with peripheral stick Daily heparin  level and CBC F/u transition to DOAC when able to take PO meds   Leeroy Mace RPh 09/22/2024, 10:07 PM

## 2024-09-22 NOTE — Progress Notes (Addendum)
   09/22/24 2038  BiPAP/CPAP/SIPAP  BiPAP/CPAP/SIPAP Pt Type Adult  BiPAP/CPAP/SIPAP V60  Mask Type Full face mask  Dentures removed? Not applicable  Mask Size Medium  Set Rate 20 breaths/min  Respiratory Rate 21 breaths/min  IPAP 12 cmH20  EPAP 5 cmH2O  FiO2 (%) 30 %  Minute Ventilation 10.4  Leak 8  Peak Inspiratory Pressure (PIP) 12  Tidal Volume (Vt) 375  Patient Home Machine No  Patient Home Mask No  Patient Home Tubing No  Auto Titrate No  Press High Alarm 30 cmH2O  Press Low Alarm 5 cmH2O  CPAP/SIPAP surface wiped down Yes  Device Plugged into RED Power Outlet Yes  BiPAP/CPAP /SiPAP Vitals  Temp 98.4 F (36.9 C)  Resp (!) 21  SpO2 96 %  Bilateral Breath Sounds Clear;Diminished  MEWS Score/Color  MEWS Score 2  MEWS Score Color Yellow

## 2024-09-22 NOTE — Progress Notes (Signed)
 eLink Physician-Brief Progress Note [name] DOB: April 18, 1941 MRN: [id number]   Date of Service  09/22/2024  HPI/Events of Note  Patient with transient desaturation.  eICU Interventions  Patient re-positioned to optimize lung function and BIPAP settings adjusted with saturation now in the mid 90's on 40 % FiO2. ABG reviewed and BIPAP settings adjusted to improve her minute ventilation.        [name] [name] 09/22/2024, 11:12 PM

## 2024-09-22 NOTE — Progress Notes (Addendum)
 NAME:  Tammy Ball, MRN:  [id number], DOB:  11-20-1940, LOS: 7 ADMISSION DATE:  09/15/2024, CONSULTATION DATE: 09/22/24 REFERRING MD:  MELODIE, CHIEF COMPLAINT:  shock, respiratory failure   History of Present Illness:  Patient is a 83 yo F w/ pertinent PMH lung cancer s/p lobectomy and cehmo, reneal cell carcinoma w/ left nephrectomy, diastolic chf, COPD on 2L [place] at home, thoracic spinal fracture involving T7, htn, hld, cad presents to [facility] on 11/4 w/ left sided chest pain.   Patient admitted last month for back. Imaging noted T7 compression spinal fracture. While in hospital patient developed worsening respiratory failure thought 2/2 to hypoventilation/atelectasis from severe pain requiring bipap. Completed abx for possible pna. IR performed kyphoplasty on 10/21 w/ improvement. Discharged to SNF for ongoing therapy.   On 11/3 patient developed cough w/ blood tinged sputum. On 11/4 patient woke up w/ acute left sided chest/back pain w/ sob. States she felt like she had fever and bodyaches. O2 had to be increased from 2 to 4L at facility. Brought to [facility] ED. On arrival patient temp 99.3 F and wbc 10. LA 1.9. BS w/ rhonchi/rales. BP soft. CXR w/ increased left basilar opacities and small left pleural effusion. Cultures obtained, given fluids, and started on rocephin /azithro. UA pending. D-dimer elevated 11.66 and trop 93. CTA chest showing multiple b/l segmental PE w/ evidence of R heart strain; RLL pleural based opacification; moderate consolidation over LLL w/ small left pleural effusion. Heparin  started. Despite iv fluids patient remained hypotensive requiring levo. Pccm consulted.  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   CHF (congestive heart failure) (HCC)    Claustrophobia    COPD (chronic obstructive pulmonary disease) (HCC)    Depression     Encounter for antineoplastic chemotherapy 02/09/2016   GERD (gastroesophageal reflux disease)    Headache    prior to menopause   Heart murmur    History of echocardiogram    Echo 2/17: EF 60-65%, grade 1 diastolic dysfunction, mild MR, trivial pericardial effusion   History of nuclear stress test    Myoview 2/17: no ischemia or scar, EF 79%; low risk   History of radiation therapy 10/30/16-11/06/16   Hyperlipidemia    Hypertension    Insomnia 05/16/2016   Myocardial infarction (HCC)    Non-small cell carcinoma of right lung, stage 2 (HCC) 10/03/2015   Oxygen  deficiency    Pneumonia    Radiation 02/29/16-04/10/16   50.4 Gy to right central chest   Renal cell carcinoma (HCC)    L nephrectomy  in 2012    Significant Hospital Events: Including procedures, antibiotic start and stop dates in addition to other pertinent events   11/4 admit w/ pna and pe on pressors 11/4 intubated, central venous access, on pressors-Levophed, on epoprostenol, LVEF 70-75% no RV abnormality  11/5 art line 11/6 oozig from art line, labile blood pressures and HR. C/f hemorrhagic shock w hgb 7.3 getting 2 PRBC  11/8 Extubated, Afib RVR briefly on amiodarone.  chest pain, started argatroban while heparin  is on hold, pending HIT antibodies 11/9 a. Fib with RVR restarted amiodarone, remained on vasopressors overnight, became acutely hypercapnic, placed on BiPAP with improvement in mental status. 11/10 back on bipap and pressors. More acidotic overnight tachycardic w/ AF RVR. Off dex 11/11 off bipap all night then  hard to arouse in am. Got hypotensive again so back om low dose NE 2 mcg/min. Left: Resting left ankle-brachial index indicates mild left lower  extremity arterial disease. The left toe-brachial index is abnormal.   Interim History / Subjective:  Was off bipap all night and hard to arouse this am  Objective    Blood pressure (!) 121/55, pulse 63, temperature 97.7 F (36.5 C), resp. rate 18, height 5'  3 (1.6 m), weight 64.8 kg, SpO2 95%.    FiO2 (%):  [30 %-40 %] 40 %   Intake/Output Summary (Last 24 hours) at 09/22/2024 1042 Last data filed at 09/22/2024 0855 Gross per 24 hour  Intake 969.81 ml  Output 1050 ml  Net -80.19 ml    Filed Weights   09/18/24 0500 09/19/24 0704 09/22/24 0500  Weight: 63.3 kg 66.8 kg 64.8 kg    Examination: General no distress. Back on BIPAP. Was off it most of the night  HENT BIPAP in place.  Neuro awake no distress. Follows commands  Pulm rhonchi and rales left base. No accessory use Card irreg reg w/ CVR Abd obese Ext warm but mottled discoloration. + anasarca. Pulses palp  Gu cl yellow   Resolved problem list   Assessment and Plan   Septic Shock due to  A. Baumannii bacteremia  -was off norepi gtt on 11/9, started to wean stress dose steroids on 8th and discontinued on 9th.  Back on pressors as of 11/10.  Think acidosis likely also contributes to her pressor needs when she hypoventilates.  Plan Cont to wean NE for SBP > 100  Avoid hypovolemia  Abx per below  Acinetobacter Baumannii Bacteremia: - S/p tobramycin  and Vancomycin , curb sided by ID  plan Ampicillin sulbactam day 8;  and Minocycline day 6; seems like 10-14d will call ID and discuss.   Low Risk Pulmonary Embolism: Plan Cont IV heparin  eventually DOAC   Atrial Fibrillation with RVR: rate better controlled Plan Cont tele Cont amio gtt Convert to PO on 11/12 if HR remains stable w/ CVR  Acute Hypoxic and Hypercapnic Respiratory Failure: likely due to  PNA, pulmonary edema and COPD exacerbation. Seems like deconditioning and hypoventilation also large contributing factors as clinically became more hypercarbic after being off bipap all night  Plan BIPAP at HS and PRN Cont ICS/LAMA and LABA Qid albuterol   Complete 5 d solumedrol  Will give a dose of albumin  followed by lasix  today  CXR in am   AKI: suspect component of cardiorenal etiology; still appears as though  total body overload  other etiology is due to aminoglycoside administration. - scr bumped a little w/ diuresis  Plan Keep euvolemic Renal dose meds Strict I&O Am chem Keep foley  Will give a dose of albumin  f/b lasix  to see if we can mobilize fluid   H/o reflux Plan PPI  Thrombocytopenia 2/2 sepsis Improved Plan Trend   Acute Metabolic Encephalopathy: likely due to CO2 narcosis. Improved today. Plan Supportive care Reducing seroquel given somnolence   PAD: left ankle-brachial index indicates mild left lower  extremity arterial disease. The left toe-brachial index is abnormal.  Plan Already on heparin  Will be on doac eventually  Nutrition Plan Adv diet as tolerated Will need HS BIPAP    Disposition: ICU appropriate for shock and respiratory failure.      I personally  spent 34 minutes  on this patient which included: review of medical records, nursing notes, progress notes, evaluation, interpretation of lab data and diagnostic studies, taking  independent history, performing exam, documenting plan, ordering diagnostics and interventions for the following critical care issues: Circulatory shock, Acute respiratory failure, Acute renal failure, Severe metabolic derangements with the following interventions which included: titration of ventilatory support, titration of hemodynamic drips to desired MAP, evaluation of life threatening infection and determining appropriate pharmaceutical intervention

## 2024-09-22 NOTE — Plan of Care (Signed)
  Problem: Nutrition: Goal: Adequate nutrition will be maintained Outcome: Progressing   Problem: Pain Managment: Goal: General experience of comfort will improve and/or be controlled Outcome: Progressing   Problem: Respiratory: Goal: Ability to maintain a clear airway and adequate ventilation will improve Outcome: Progressing

## 2024-09-22 NOTE — Progress Notes (Signed)
 eLink Physician-Brief Progress Note [name] DOB: [birth year] MRN: [id number]   Date of Service  09/22/2024  HPI/Events of Note  Somnolence and hypotension after receiving a bath, blood pressure has recovered with Levo at 10 mcg and BIPAP.  eICU Interventions  Will check an ABG to exclude hypercapnia with severe metabolic acidosis.        Ahana Najera U Lillith Mcneff 09/22/2024, 9:07 PM

## 2024-09-22 NOTE — TOC Progression Note (Signed)
 Transition of Care [facility]) - Progression Note    Patient Details  Name: Tammy Ball MRN: [id number] Date of Birth: Mar 31, 1941  [facility]) CM/SW Contact  Jon ONEIDA Anon, RN Phone Number: 09/22/2024, 10:54 AM  Clinical Narrative:    Pt remains in ICU. Currently requiring Bipap. Pt needing continued medical workup, not medically stable for DC. IP CM continuing to follow.   Expected Discharge Plan: Skilled Nursing Facility Barriers to Discharge: Continued Medical Work up               Expected Discharge Plan and Services   Discharge Planning Services: CM Consult Post Acute Care Choice: Skilled Nursing Facility Living arrangements for the past 2 months: Skilled Nursing Facility                                       Social Drivers of Health (SDOH) Interventions SDOH Screenings   Food Insecurity: No Food Insecurity (09/15/2024)  Housing: Low Risk  (09/15/2024)  Transportation Needs: No Transportation Needs (09/15/2024)  Utilities: Not At Risk (09/15/2024)  Alcohol  Screen: Low Risk  (06/02/2024)  Depression (PHQ2-9): Medium Risk (08/25/2024)  Financial Resource Strain: Medium Risk (06/02/2024)  Physical Activity: Inactive (06/02/2024)  Social Connections: Moderately Integrated (09/15/2024)  Recent Concern: Social Connections - Socially Isolated (08/27/2024)  Stress: No Stress Concern Present (06/02/2024)  Tobacco Use: Medium Risk (09/15/2024)  Health Literacy: Adequate Health Literacy (06/02/2024)    Readmission Risk Interventions    09/16/2024    3:43 PM 08/31/2024    3:47 PM 06/11/2022   12:06 PM  Readmission Risk Prevention Plan  Transportation Screening Complete Complete Complete  PCP or Specialist Appt within 3-5 Days  Complete Complete  HRI or Home Care Consult  Complete Complete  Social Work Consult for Recovery Care Planning/Counseling  Complete Complete  Palliative Care Screening  Not Applicable Not Applicable  Medication Review Furniture Conservator/restorer) Complete Complete Complete  PCP or Specialist appointment within 3-5 days of discharge Complete    HRI or Home Care Consult Complete    SW Recovery Care/Counseling Consult Complete    Palliative Care Screening Not Applicable    Skilled Nursing Facility Complete

## 2024-09-23 ENCOUNTER — Inpatient Hospital Stay (HOSPITAL_COMMUNITY)

## 2024-09-23 DIAGNOSIS — A419 Sepsis, unspecified organism: Secondary | ICD-10-CM

## 2024-09-23 DIAGNOSIS — J90 Pleural effusion, not elsewhere classified: Secondary | ICD-10-CM | POA: Diagnosis not present

## 2024-09-23 DIAGNOSIS — I4891 Unspecified atrial fibrillation: Secondary | ICD-10-CM | POA: Diagnosis not present

## 2024-09-23 DIAGNOSIS — R6521 Severe sepsis with septic shock: Secondary | ICD-10-CM

## 2024-09-23 DIAGNOSIS — A4154 Sepsis due to Acinetobacter baumannii: Secondary | ICD-10-CM | POA: Diagnosis not present

## 2024-09-23 DIAGNOSIS — J9601 Acute respiratory failure with hypoxia: Secondary | ICD-10-CM | POA: Diagnosis not present

## 2024-09-23 DIAGNOSIS — Z452 Encounter for adjustment and management of vascular access device: Secondary | ICD-10-CM | POA: Diagnosis not present

## 2024-09-23 LAB — GLUCOSE, CAPILLARY
Glucose-Capillary: 92 mg/dL (ref 70–99)
Glucose-Capillary: 77 mg/dL (ref 70–99)
Glucose-Capillary: 83 mg/dL (ref 70–99)
Glucose-Capillary: 109 mg/dL — ABNORMAL HIGH (ref 70–99)
Glucose-Capillary: 91 mg/dL (ref 70–99)

## 2024-09-23 LAB — CBC
HCT: 27.7 % — ABNORMAL LOW (ref 36.0–46.0)
Hemoglobin: 8.9 g/dL — ABNORMAL LOW (ref 12.0–15.0)
MCH: 31.3 pg (ref 26.0–34.0)
MCHC: 32.1 g/dL (ref 30.0–36.0)
MCV: 97.5 fL (ref 80.0–100.0)
Platelets: 130 K/uL — ABNORMAL LOW (ref 150–400)
RBC: 2.84 MIL/uL — ABNORMAL LOW (ref 3.87–5.11)
RDW: 17.2 % — ABNORMAL HIGH (ref 11.5–15.5)
WBC: 9 K/uL (ref 4.0–10.5)
nRBC: 0.7 % — ABNORMAL HIGH (ref 0.0–0.2)

## 2024-09-23 LAB — HEPARIN LEVEL (UNFRACTIONATED): Heparin Unfractionated: 0.52 [IU]/mL (ref 0.30–0.70)

## 2024-09-23 LAB — BASIC METABOLIC PANEL WITH GFR
Anion gap: 16 — ABNORMAL HIGH (ref 5–15)
BUN: 60 mg/dL — ABNORMAL HIGH (ref 8–23)
CO2: 26 mmol/L (ref 22–32)
Calcium: 9.2 mg/dL (ref 8.9–10.3)
Chloride: 99 mmol/L (ref 98–111)
Creatinine, Ser: 3.12 mg/dL — ABNORMAL HIGH (ref 0.44–1.00)
GFR, Estimated: 14 mL/min — ABNORMAL LOW (ref >60)
Glucose, Bld: 104 mg/dL — ABNORMAL HIGH (ref 70–99)
Potassium: 4.6 mmol/L (ref 3.5–5.1)
Sodium: 141 mmol/L (ref 135–145)

## 2024-09-23 LAB — PRO BRAIN NATRIURETIC PEPTIDE: Pro Brain Natriuretic Peptide: >35000 pg/mL — ABNORMAL HIGH (ref <300.0)

## 2024-09-23 LAB — MAGNESIUM: Magnesium: 2.4 mg/dL (ref 1.7–2.4)

## 2024-09-23 LAB — PHOSPHORUS: Phosphorus: 6 mg/dL — ABNORMAL HIGH (ref 2.5–4.6)

## 2024-09-23 MED ORDER — ALBUMIN HUMAN 25 % IV SOLN
25.0000 g | Freq: Four times a day (QID) | INTRAVENOUS | Status: DC
Start: 2024-09-23 — End: 2024-09-23
  Administered 2024-09-23 (×2): 25 g via INTRAVENOUS
  Filled 2024-09-23 (×3): qty 100

## 2024-09-23 MED ORDER — GLYCOPYRROLATE 0.2 MG/ML IJ SOLN
0.2000 mg | INTRAMUSCULAR | Status: DC | PRN
  Administered 2024-09-23: 0.2 mg via INTRAVENOUS

## 2024-09-23 MED ORDER — ACETAMINOPHEN 325 MG PO TABS: 650.0000 mg | ORAL_TABLET | Freq: Four times a day (QID) | ORAL | Status: DC | PRN

## 2024-09-23 MED ORDER — DIPHENHYDRAMINE HCL 50 MG/ML IJ SOLN: 25.0000 mg | INTRAMUSCULAR | Status: DC | PRN

## 2024-09-23 MED ORDER — GLYCOPYRROLATE 0.2 MG/ML IJ SOLN
0.2000 mg | INTRAMUSCULAR | Status: DC | PRN
  Filled 2024-09-23: qty 1

## 2024-09-23 MED ORDER — POLYVINYL ALCOHOL 1.4 % OP SOLN: 1.0000 [drp] | Freq: Four times a day (QID) | OPHTHALMIC | Status: DC | PRN

## 2024-09-23 MED ORDER — PRISMASOL BGK 4/2.5 32-4-2.5 MEQ/L EC SOLN: Status: DC

## 2024-09-23 MED ORDER — DEXTROSE 5 % IV SOLN
4.0000 mg | Freq: Once | INTRAVENOUS | Status: AC
  Administered 2024-09-23: 4 mg via INTRAVENOUS
  Filled 2024-09-23: qty 16

## 2024-09-23 MED ORDER — HYDROMORPHONE HCL-NACL 50-0.9 MG/50ML-% IV SOLN
0.0000 mg/h | INTRAVENOUS | Status: DC
  Administered 2024-09-23: 1 mg/h via INTRAVENOUS
  Filled 2024-09-23: qty 50

## 2024-09-23 MED ORDER — ACETAMINOPHEN 650 MG RE SUPP: 650.0000 mg | Freq: Four times a day (QID) | RECTAL | Status: DC | PRN

## 2024-09-23 MED ORDER — HEPARIN SODIUM (PORCINE) 1000 UNIT/ML DIALYSIS
1000.0000 [IU] | INTRAMUSCULAR | Status: DC | PRN
  Administered 2024-09-23: 2400 [IU] via INTRAVENOUS_CENTRAL
  Filled 2024-09-23: qty 4

## 2024-09-23 MED ORDER — HYDROMORPHONE BOLUS VIA INFUSION: 1.0000 mg | INTRAVENOUS | Status: DC | PRN

## 2024-09-23 MED ORDER — SODIUM CHLORIDE 0.9 % IV SOLN: INTRAVENOUS | Status: DC

## 2024-09-23 MED ORDER — GLYCOPYRROLATE 1 MG PO TABS
1.0000 mg | ORAL_TABLET | ORAL | Status: DC | PRN
Start: 2024-09-23 — End: 2024-09-24

## 2024-09-23 MED ORDER — FUROSEMIDE 10 MG/ML IJ SOLN
8.0000 mg/h | INTRAVENOUS | Status: DC
  Administered 2024-09-23: 8 mg/h via INTRAVENOUS
  Filled 2024-09-23: qty 20

## 2024-09-23 MED ORDER — LORAZEPAM 2 MG/ML IJ SOLN: 2.0000 mg | INTRAMUSCULAR | Status: DC | PRN

## 2024-09-23 MED ORDER — HEPARIN SODIUM (PORCINE) 1000 UNIT/ML DIALYSIS: 1000.0000 [IU] | INTRAMUSCULAR | Status: DC | PRN

## 2024-09-23 NOTE — Consult Note (Signed)
 Renal Service Consult Note [facility] [name], MD  Patient: Tammy Ball Date: 09/23/2024 Requesting Physician: Dr Catherine  Reason for Consult: Renal failure HPI: The patient is a 83 y.o. year-old w/ PMH of lung cancer, renal cell cancer sp L nephrectomy, hx diast CHF, COPD, chronic resp failure and recent hospital stay for spinal fx's. Pt presented w/ chest pain w/ hemoptysis, coughing, recently rx'd for PNA. Recently at [facility] as well. Pt admitted on 11/04 for septic shock d/t PNA w/ a/c hypoxic/ hypercarbic resp faliure due to COPD and PNA. Blood cx's grew acinetobacter. Pt got lots of necessary IVF's for sepsis, and IV contrast on 11/08 w/ CTA. Pt was extubated on 11/08. Now is requiring bipap and low dose levophead, not responding to IV lasix  or lasix  gtt. UOP dropped off the last 2 days. CXR shows bilat worsening effusion and pulm infiltrates concerning for edema. We are asked to see for CRRT.   Pt seen in ICU. Pt on bipap, unable to answer questions.    ROS - n/a  Past Medical History  Past Medical History:  Diagnosis Date   Anemia    was while doing chemo   Anxiety    Arthritis    Asthma    Chemotherapy-induced neuropathy 11/01/2015   CHF (congestive heart failure) (HCC)    Claustrophobia    COPD (chronic obstructive pulmonary disease) (HCC)    Depression    Encounter for antineoplastic chemotherapy 02/09/2016   GERD (gastroesophageal reflux disease)    Headache    prior to menopause   Heart murmur    History of echocardiogram    Echo 2/17: EF 60-65%, grade 1 diastolic dysfunction, mild MR, trivial pericardial effusion   History of nuclear stress test    Myoview 2/17: no ischemia or scar, EF 79%; low risk   History of radiation therapy 10/30/16-11/06/16   Hyperlipidemia    Hypertension    Insomnia 05/16/2016   Myocardial infarction (HCC)    Non-small cell carcinoma of right lung, stage 2 (HCC) 10/03/2015   Oxygen  deficiency    Pneumonia     Radiation 02/29/16-04/10/16   50.4 Gy to right central chest   Renal cell carcinoma (HCC)    L nephrectomy  in 2012   Past Surgical History  Past Surgical History:  Procedure Laterality Date   BACK SURGERY     cervical 1991   BIOPSY  11/15/2022   Procedure: BIOPSY;  Surgeon: San Sandor GAILS, DO;  Location: [facility];  Service: Gastroenterology;;   COLONOSCOPY WITH PROPOFOL  N/A 11/15/2022   Procedure: COLONOSCOPY WITH PROPOFOL ;  Surgeon: San Sandor GAILS, DO;  Location: [facility];  Service: Gastroenterology;  Laterality: N/A;   ESOPHAGOGASTRODUODENOSCOPY (EGD) WITH PROPOFOL  N/A 11/15/2022   Procedure: ESOPHAGOGASTRODUODENOSCOPY (EGD) WITH PROPOFOL ;  Surgeon: San Sandor GAILS, DO;  Location: [facility];  Service: Gastroenterology;  Laterality: N/A;   EYE SURGERY     IR GENERIC HISTORICAL  01/16/2017   IR RADIOLOGIST EVAL & MGMT 01/16/2017 [facility]   IR GENERIC HISTORICAL  01/31/2017   IR FLUORO GUIDED NEEDLE PLC ASPIRATION/INJECTION LOC 01/31/2017 [name], MD [facility]   IR GENERIC HISTORICAL  01/31/2017   IR VERTEBROPLASTY CERV/THOR BX INC UNI/BIL INC/INJECT/IMAGING 01/31/2017 [name], MD [facility]   IR GENERIC HISTORICAL  01/31/2017   IR VERTEBROPLASTY EA ADDL (T&LS) BX INC UNI/BIL INC INJECT/IMAGING 01/31/2017 [name], MD [facility]   IR KYPHO THORACIC WITH BONE BIOPSY  09/01/2024   kidney cancer  LOBECTOMY Right    RUL   NEPHRECTOMY  2012   POLYPECTOMY  11/15/2022   Procedure: POLYPECTOMY;  Surgeon: San Sandor GAILS, DO;  Location: [facility];  Service: Gastroenterology;;   SPINE SURGERY     TUBAL LIGATION     VIDEO ASSISTED THORACOSCOPY (VATS)/WEDGE RESECTION Right 01/18/2016   Procedure: VIDEO ASSISTED THORACOSCOPY (VATS)/LUNG RESECTION, THOROCOTOMY, RIGHT UPPER LOBECTOMY, LYMPH NODE DISSECTION, PLACEMENT OF ON Q;  Surgeon: [name], MD;  Location: [facility] OR;  Service: Thoracic;  Laterality: Right;   VIDEO  BRONCHOSCOPY Bilateral 09/20/2015   Procedure: VIDEO BRONCHOSCOPY WITHOUT FLUORO;  Surgeon: [name], MD;  Location: [facility];  Service: Cardiopulmonary;  Laterality: Bilateral;   VIDEO BRONCHOSCOPY N/A 01/18/2016   Procedure: VIDEO BRONCHOSCOPY;  Surgeon: [name], MD;  Location: [facility] OR;  Service: Thoracic;  Laterality: N/A;   Family History  Family History  Problem Relation Age of Onset   Cancer Mother        Bladder Cancer   Hypertension Mother    CAD Mother 69   Heart disease Sister    Breast cancer Sister    Birth defects Sister    Obesity Brother    Cancer Maternal Grandmother    Heart attack Daughter 40       s/p CABG   Glaucoma Daughter    Colon cancer Neg Hx    Colon polyps Neg Hx    Esophageal cancer Neg Hx    Rectal cancer Neg Hx    Stomach cancer Neg Hx    Social History  reports that she quit smoking about 13 years ago. Her smoking use included cigarettes. She started smoking about 63 years ago. She has a 50 pack-year smoking history. She has never used smokeless tobacco. She reports that she does not drink alcohol  and does not use drugs. Allergies  Allergies  Allergen Reactions   Bee Venom Anaphylaxis, Shortness Of Breath, Swelling and Other (See Comments)    Swelling at site (reaction to bees and wasps)   Amlodipine  Swelling and Other (See Comments)    Swelling of the ankles and hands    Levofloxacin Other (See Comments)    Joint pain    Alendronate  Other (See Comments)    Joint pains / hypercalcemia    Hctz [Hydrochlorothiazide ] Palpitations and Other (See Comments)    Sweating    Home medications Prior to Admission medications   Medication Sig Start Date End Date Taking? Authorizing Provider  acetaminophen  (TYLENOL ) 500 MG tablet Take 500 mg by mouth every 6 (six) hours as needed for moderate pain (pain score 4-6) (or headaches).   Yes [provider]  albuterol  (PROVENTIL ) (2.5 MG/3ML) 0.083% nebulizer solution INHALE 3 ML BY  NEBULIZATION EVERY 6 HOURS AS NEEDED FOR WHEEZING OR SHORTNESS OF BREATH 04/30/24  Yes Byrum, Emberley Kral S, MD  alum & mag hydroxide-simeth (MAALOX PLUS) 400-400-40 MG/5ML suspension Take 10 mLs by mouth every 6 (six) hours as needed for indigestion (or gas/nausea).   Yes [provider]  ARTIFICIAL TEAR SOLUTION OP Place 1 drop into both eyes every 2 (two) hours as needed (for dryness).   Yes [provider]  ascorbic acid  (VITAMIN C) 500 MG tablet Take 500 mg by mouth 2 (two) times daily.   Yes [provider]  aspirin  EC 81 MG tablet Take 81 mg by mouth every morning.   Yes [provider]  atorvastatin  (LIPITOR) 40 MG tablet TAKE 1 TABLET BY MOUTH EVERY DAY Patient taking  differently: Take 40 mg by mouth at bedtime. 02/02/24  Yes [name], [name], MD  bisacodyl  (DULCOLAX) 10 MG suppository Place 10 mg rectally daily as needed (for constipation- if no relief from Milk of Magnesia).   Yes [provider]  budesonide -glycopyrrolate -formoterol  (BREZTRI  AEROSPHERE) 160-9-4.8 MCG/ACT AERO inhaler Inhale 2 puffs into the lungs in the morning and at bedtime. 04/29/24  Yes [name], MD  calcium  carbonate (TUMS - DOSED IN MG ELEMENTAL CALCIUM ) 500 MG chewable tablet Chew 1 tablet by mouth every 6 (six) hours as needed for heartburn.   Yes [provider]  carvedilol  (COREG ) 25 MG tablet Take 1 tablet (25 mg total) by mouth 2 (two) times daily. 04/15/24  Yes [name], [name], NP  cholecalciferol  (VITAMIN D3) 25 MCG (1000 UNIT) tablet Take 1,000 Units by mouth daily.   Yes [provider]  cycloSPORINE (RESTASIS) 0.05 % ophthalmic emulsion Place 1 drop into both eyes every 12 (twelve) hours. 08/02/24  Yes [provider]  furosemide  (LASIX ) 20 MG tablet Take 1 tablet (20 mg total) by mouth daily. 09/03/24  Yes Lue Elsie BROCKS, MD  Guaifenesin  (MUCINEX  MAXIMUM STRENGTH) 1200 MG TB12 Take 1,200 mg by mouth every morning.   Yes  [provider]  loperamide  (IMODIUM ) 2 MG capsule Take 1 capsule (2 mg total) by mouth 4 (four) times daily as needed for diarrhea or loose stools. Patient taking differently: Take 2 mg by mouth every 6 (six) hours as needed for diarrhea or loose stools. 08/19/23  Yes [name], [name], MD  losartan  (COZAAR ) 100 MG tablet TAKE 1 TABLET BY MOUTH EVERY DAY 02/17/24  Yes [name], [name], MD  MILK OF MAGNESIA 1200 MG/15ML suspension Take 30 mLs by mouth daily as needed for mild constipation (if no bowel movement after 3 days).   Yes [provider]  Nutritional Supplements (NUTRITIONAL DRINK PO) Take 120 mLs by mouth See admin instructions. Sugar-free MedPass - Drink 120 ml's by mouth at 10 AM and 6 PM   Yes [provider]  Omega-3 Fatty Acids  (FISH OIL) 1000 MG CAPS Take 1,000 mg by mouth in the morning.   Yes [provider]  ondansetron  (ZOFRAN -ODT) 4 MG disintegrating tablet 4mg  ODT q4 hours prn nausea/vomit Patient taking differently: Take 4 mg by mouth every 4 (four) hours as needed for nausea or vomiting (dissolve orally). 08/22/24  Yes Suzette Pac, MD  OXYGEN  Inhale 2 L/min into the lungs continuous.   Yes [provider]  pantoprazole  (PROTONIX ) 40 MG tablet Take 40 mg by mouth in the morning.   Yes [provider]  polyethylene glycol (MIRALAX  / GLYCOLAX ) 17 g packet Take 17 g by mouth 2 (two) times daily. 09/03/24  Yes Lue Elsie BROCKS, MD  predniSONE  (DELTASONE ) 10 MG tablet Take 10-20 mg by mouth See admin instructions. Take 20 mg by mouth with food for 5 days, then decrease to 10 mg once a day for 5 days 09/08/24  Yes [provider]  senna-docusate (SENOKOT-S) 8.6-50 MG tablet Take 1 tablet by mouth 2 (two) times daily. 09/03/24  Yes Lue Elsie BROCKS, MD  sodium chloride  (OCEAN) 0.65 % SOLN nasal spray Place 1 spray into both nostrils at bedtime as needed for congestion.   Yes [provider]  sodium  phosphate (FLEET) ENEM Place 1 enema rectally daily as needed (if no relief from Dulcolax suppository).   Yes [provider]  traMADol  (ULTRAM ) 50 MG tablet Take 1 tablet (50 mg total)  by mouth every 6 (six) hours as needed. Patient taking differently: Take 50 mg by mouth every 6 (six) hours as needed (for pain). 09/03/24  Yes Lue Elsie BROCKS, MD  traZODone  (DESYREL ) 50 MG tablet Take 0.5-1 tablets (25-50 mg total) by mouth at bedtime as needed for sleep. Patient taking differently: Take 25 mg by mouth at bedtime as needed for sleep. 09/03/24  Yes Lue Elsie BROCKS, MD  albuterol  (VENTOLIN  HFA) [phone] Base) MCG/ACT inhaler Inhale 1-2 puffs into the lungs every 6 (six) hours as needed for wheezing or shortness of breath. Patient not taking: Reported on 09/15/2024 04/29/24   [name], MD     Vitals:   09/23/24 1600 09/23/24 1629 09/23/24 1630 09/23/24 1634  BP: (!) 146/71     Pulse: 63   60  Resp: (!) 24   (!) 22  Temp: 99 F (37.2 C)   99 F (37.2 C)  TempSrc:      SpO2: 94% 95% 95% 96%  Weight:      Height:       Exam Gen elderly WF, lethargic, on bipap Sclera anicteric, throat clear  No jvd or bruits Chest clear bilat to bases RRR no MRG Abd soft ntnd no mass or ascites +bs Ext diffuse 1-2+ pitting edema LE's/ UE's    RIJ temp cath (just placed)  Date   Creat  eGFR (ml/min) 2012- 2020  0.68- 2.01  2021   1.14- 1.79 2022   0.98- 1.29 2023   1.00- 1.45 2024   0.84- 1.23 45- > 60 ml/min May 2025  0.95 10/15- 09/03/24 0.82- 1.90 11/04   1.35 11/06   1.60 11/08   1.39 11/09   1.61 11/10   2.04 11/11   2.51 11/12   3.12  UA 11/04: prot 30, 0-5 rbc, 6-10 wbc          Assessment/ Plan: AKI on CKD 3a: b/l creat 0.8- 1.2 from 2024, eGFR 45- > 60 ml/min. Creat here was 1.3 on admission in the setting of septic shock, PNA and acinetobacter bacteremia. Required pressors the 1st 3-4 days, then again on 11/10. Had IV contrast on 11/06 and on 10/08  w/ declining UOP since then. Volume is up and pt has worsening resp status w/ pulm edema, pleural effusions and underlying COPD. Not responding to IV lasix . She has ATN and should likely recover. Will need CRRT. See orders.  Acute/ chronic hypoxic and hypercarbic resp failure COPD Acinetobacter sepsis/ PNA: admitting diagnosis, rx'd w/ IV abx, pressors support, vent support. PE: by CTA chest Atrial fib: per CCM       Rob Geralynn  MD [facility] 09/23/2024, 4:42 PM  Recent Labs  Lab 09/17/24 0744 09/17/24 1037 09/19/24 1256 09/20/24 0741 09/22/24 0500 09/23/24 0340  HGB 10.6*   < >  --    < > 10.3* 8.9*  ALBUMIN  2.2*  --  2.8*  --   --   --   CALCIUM  8.3*   < > 8.7*   < > 9.3 9.2  PHOS  --   --   --    < > 6.5* 6.0*  CREATININE 1.60*   < > 1.39*   < > 2.51* 3.12*  K 4.9   < > 2.9*   < > 4.7 4.6   < > = values in this interval not displayed.   Inpatient medications:  albuterol   2.5 mg Nebulization QID   atorvastatin   40 mg Oral Daily  budesonide  (PULMICORT ) nebulizer solution  0.5 mg Nebulization BID   Chlorhexidine  Gluconate Cloth  6 each Topical Daily   cycloSPORINE  1 drop Both Eyes BID   feeding supplement  1 Container Oral TID BM   insulin  aspart  0-9 Units Subcutaneous Q4H   lidocaine   1 patch Transdermal Q24H   methylPREDNISolone  (SOLU-MEDROL ) injection  40 mg Intravenous Q24H   metoprolol  tartrate  25 mg Oral BID   minocycline  200 mg Oral BID   mouth rinse  15 mL Mouth Rinse 4 times per day   pantoprazole  (PROTONIX ) IV  40 mg Intravenous Daily   QUEtiapine  25 mg Oral QHS   revefenacin  175 mcg Nebulization Daily   sodium chloride  HYPERTONIC  4 mL Nebulization QID    albumin  human Stopped (09/23/24 1110)   amiodarone 30 mg/hr (09/23/24 1600)   ampicillin-sulbactam (UNASYN) IV Stopped (09/23/24 1310)   heparin  Stopped (09/23/24 1510)   norepinephrine (LEVOPHED) Adult infusion Stopped (09/23/24 0234)   acetaminophen , artificial tears, docusate sodium , heparin ,  hydrOXYzine, labetalol, metoprolol  tartrate, ondansetron  (ZOFRAN ) IV, mouth rinse, oxyCODONE , polyethylene glycol

## 2024-09-23 NOTE — Progress Notes (Addendum)
 eLink Physician-Brief Progress Note Patient Name: ELDA DUNKERSON DOB: 1941-06-28 MRN: [id number]   Date of Service  09/23/2024  HPI/Events of Note  BNP > 35,000  eICU Interventions  Lasix  gtt ordered at 8 mg / hour.        Otha Rickles U Loman Logan 09/23/2024, 5:53 AM

## 2024-09-23 NOTE — Progress Notes (Signed)
 Patient's daughter came out the room and talk to RN that her mother wants to transition to comfort care. Called Elink RN and Dr Epimenio said he will camera in to talk to patient and family.

## 2024-09-23 NOTE — Consult Note (Signed)
 [facility]                                                                                        Patient Identification: [name] MRN: [id number] Admit Date: 09/15/2024  9:41 AM Today's Date: 09/23/2024 Reason for consult: Acinetobacter bacteremia Requesting provider: Maude Hamming NP  Principal Problem:   Septic shock [facility])   Antibiotics:  IV Unasyn 11/4- Minocycycline 11/6  Lines/Hardware: Left internal jugular CVC  Assessment # Septic shock 2/2 below - resolved  # Acinetobacter baumannii 2/2 possible PNA - CVC unlikely source as placed after blood cx on 11/4  # Acute Hypoxic/Hypercapnic Respiratory Failure 2/2 PNA + COPD Exacerbation and pulmonary edema  - PCCM managing   # AKI  - in the setting of Cardiorenal, fluid overload and aminoglycoside - getting albumin  and lasix  drip stopped   # PE  # DVT of rt peroneal veins - on AC  # Afib w RVR - on amiodarone drip   # Femoral pseudoaneurysm - related to arterial line   Recommendations  - continue IV Unasyn and minocycline. Will not change minocycline to carbapenem today as patient seems to be clinically improving on current regimen and nurse confirmed she got PO minocycline today. Per microbiology, likely susceptibility back tomorrow to guide antibiotic recs. Some delay in reporting of sensitivity. If unable to get Po minocycline, needs to add meropenem  - CVC to be removed if no longer required - monitor CBC and CMP - universal/standard isolation precautions Following   Rest of the management as per the primary team. Please call with questions or concerns.  Thank you for the consult  __________________________________________________________________________________________________________ HPI and Hospital Course: 83 year old female with prior history of lung cancer s/p lobectomy and chemotherapy,  RCC with left nephrectomy, CHF, COPD on 2 L nasal cannula at home, T7 spinal fracture, HTN, HLD, CAD who presented initially on 11/4 with acute left-sided chest pain/cough with blood-tinged sputum, shortness of breath.  She also had subjective fever and bodyaches with oxygen  requirement increasing from 2 L to 4 L at the facility.   Chest x-ray on admission with increased left basilar opacities and small left pleural effusion  CTA chest with multiple bilateral segmental PE with evidence of right heart strain, right LL pleural-based opacification, moderate consolidation over LLL with small left pleural effusion.  Started on anticoagulation  She was given IVF, started on ceftriaxone /azithromycin .   Patient was admitted to MICU as required vasopressors for hypotension despite of IV fluids, debated on 11/4.  She had oozing from her right art line on 11/6 requiring PRBC transfusion.  She was extubated on 11/8, hospital course complicated by A-fib with RVR  She was started back on low-dose Levophed on 11/10 due to hypotension  Patient recently admitted for T7 compression spinal fracture.  Hospital course was complicated by worsening respiratory failure thought to be related to hypotension/atelectasis from severe pain requiring BiPAP.  Completed course of antibiotics for possible pneumonia, underwent kyphoplasty with IR on 10/20 with improvement and discharged to SNF.   ROS: General- Denies fever, chills, loss of appetite and loss of  weight HEENT - Denies headache, blurry vision, neck pain, sinus pain Chest - Denies any chest pain,  cough CVS- Denies any dizziness/lightheadedness, syncopal attacks, palpitations Abdomen- Denies any nausea, vomiting, abdominal pain, hematochezia and diarrhea Neuro - Denies any weakness, numbness, tingling sensation Psych - Denies any changes in mood irritability or depressive symptoms GU- Denies any burning, dysuria, hematuria or increased frequency of  urination Skin - denies any rashes/lesions MSK - denies any joint pain/swelling or restricted ROM   Past Medical History:  Diagnosis Date   Anemia    was while doing chemo   Anxiety    Arthritis    Asthma    Chemotherapy-induced neuropathy 11/01/2015   CHF (congestive heart failure) (HCC)    Claustrophobia    COPD (chronic obstructive pulmonary disease) (HCC)    Depression    Encounter for antineoplastic chemotherapy 02/09/2016   GERD (gastroesophageal reflux disease)    Headache    prior to menopause   Heart murmur    History of echocardiogram    Echo 2/17: EF 60-65%, grade 1 diastolic dysfunction, mild MR, trivial pericardial effusion   History of nuclear stress test    Myoview 2/17: no ischemia or scar, EF 79%; low risk   History of radiation therapy 10/30/16-11/06/16   Hyperlipidemia    Hypertension    Insomnia 05/16/2016   Myocardial infarction (HCC)    Non-small cell carcinoma of right lung, stage 2 (HCC) 10/03/2015   Oxygen  deficiency    Pneumonia    Radiation 02/29/16-04/10/16   50.4 Gy to right central chest   Renal cell carcinoma (HCC)    L nephrectomy  in 2012   Past Surgical History:  Procedure Laterality Date   BACK SURGERY     cervical 1991   BIOPSY  11/15/2022   Procedure: BIOPSY;  Surgeon: San Sandor GAILS, DO;  Location: [facility];  Service: Gastroenterology;;   COLONOSCOPY WITH PROPOFOL  N/A 11/15/2022   Procedure: COLONOSCOPY WITH PROPOFOL ;  Surgeon: San Sandor GAILS, DO;  Location: [facility];  Service: Gastroenterology;  Laterality: N/A;   ESOPHAGOGASTRODUODENOSCOPY (EGD) WITH PROPOFOL  N/A 11/15/2022   Procedure: ESOPHAGOGASTRODUODENOSCOPY (EGD) WITH PROPOFOL ;  Surgeon: San Sandor GAILS, DO;  Location: [facility];  Service: Gastroenterology;  Laterality: N/A;   EYE SURGERY     IR GENERIC HISTORICAL  01/16/2017   IR RADIOLOGIST EVAL & MGMT 01/16/2017 [facility]   IR GENERIC HISTORICAL  01/31/2017   IR FLUORO GUIDED NEEDLE PLC  ASPIRATION/INJECTION LOC 01/31/2017 [name], MD [facility]   IR GENERIC HISTORICAL  01/31/2017   IR VERTEBROPLASTY CERV/THOR BX INC UNI/BIL INC/INJECT/IMAGING 01/31/2017 [name], MD [facility]   IR GENERIC HISTORICAL  01/31/2017   IR VERTEBROPLASTY EA ADDL (T&LS) BX INC UNI/BIL INC INJECT/IMAGING 01/31/2017 [name], MD [facility]   IR KYPHO THORACIC WITH BONE BIOPSY  09/01/2024   kidney cancer     LOBECTOMY Right    RUL   NEPHRECTOMY  2012   POLYPECTOMY  11/15/2022   Procedure: POLYPECTOMY;  Surgeon: San Sandor GAILS, DO;  Location: [facility];  Service: Gastroenterology;;   SPINE SURGERY     TUBAL LIGATION     VIDEO ASSISTED THORACOSCOPY (VATS)/WEDGE RESECTION Right 01/18/2016   Procedure: VIDEO ASSISTED THORACOSCOPY (VATS)/LUNG RESECTION, THOROCOTOMY, RIGHT UPPER LOBECTOMY, LYMPH NODE DISSECTION, PLACEMENT OF ON Q;  Surgeon: [name], MD;  Location: [facility];  Service: Thoracic;  Laterality: Right;   VIDEO BRONCHOSCOPY Bilateral 09/20/2015   Procedure: VIDEO BRONCHOSCOPY WITHOUT FLUORO;  Surgeon: Harden Staff  V, MD;  Location: [facility];  Service: Cardiopulmonary;  Laterality: Bilateral;   VIDEO BRONCHOSCOPY N/A 01/18/2016   Procedure: VIDEO BRONCHOSCOPY;  Surgeon: [name], MD;  Location: [facility];  Service: Thoracic;  Laterality: N/A;     Scheduled Meds:  albuterol   2.5 mg Nebulization QID   atorvastatin   40 mg Oral Daily   budesonide  (PULMICORT ) nebulizer solution  0.5 mg Nebulization BID   Chlorhexidine  Gluconate Cloth  6 each Topical Daily   cycloSPORINE  1 drop Both Eyes BID   feeding supplement  1 Container Oral TID BM   insulin  aspart  0-9 Units Subcutaneous Q4H   lidocaine   1 patch Transdermal Q24H   methylPREDNISolone  (SOLU-MEDROL ) injection  40 mg Intravenous Q24H   metoprolol  tartrate  25 mg Oral BID   minocycline  200 mg Oral BID   mouth rinse  15 mL Mouth Rinse 4 times per day   pantoprazole  (PROTONIX ) IV  40 mg  Intravenous Daily   QUEtiapine  25 mg Oral QHS   revefenacin  175 mcg Nebulization Daily   sodium chloride  HYPERTONIC  4 mL Nebulization QID   Continuous Infusions:  amiodarone 30 mg/hr (09/23/24 0700)   ampicillin-sulbactam (UNASYN) IV Stopped (09/23/24 0418)   furosemide  (LASIX ) 200 mg in dextrose  5 % 100 mL (2 mg/mL) infusion 8 mg/hr (09/23/24 0700)   heparin  550 Units/hr (09/23/24 0700)   norepinephrine (LEVOPHED) Adult infusion Stopped (09/23/24 0234)   PRN Meds:.acetaminophen , artificial tears, docusate sodium , hydrOXYzine, labetalol, metoprolol  tartrate, ondansetron  (ZOFRAN ) IV, mouth rinse, oxyCODONE , polyethylene glycol  Allergies  Allergen Reactions   Bee Venom Anaphylaxis, Shortness Of Breath, Swelling and Other (See Comments)    Swelling at site (reaction to bees and wasps)   Amlodipine  Swelling and Other (See Comments)    Swelling of the ankles and Ball    Levofloxacin Other (See Comments)    Joint pain    Alendronate  Other (See Comments)    Joint pains / hypercalcemia    Hctz [Hydrochlorothiazide ] Palpitations and Other (See Comments)    Sweating    Social History   Socioeconomic History   Marital status: Widowed    Spouse name: Not on file   Number of children: 4   Years of education: Not on file   Highest education level: Bachelor's degree (e.g., BA, AB, BS)  Occupational History   Not on file  Tobacco Use   Smoking status: Former    Current packs/day: 0.00    Average packs/day: 1 pack/day for 50.0 years (50.0 ttl pk-yrs)    Types: Cigarettes    Start date: 03/15/1961    Quit date: 03/16/2011    Years since quitting: 13.5   Smokeless tobacco: Never   Tobacco comments:    last 4-5 years of smoking, smoked 0.5 pack/day   Vaping Use   Vaping status: Never Used  Substance and Sexual Activity   Alcohol  use: No    Alcohol /week: 0.0 standard drinks of alcohol    Drug use: No   Sexual activity: Not Currently  Other Topics Concern   Not on file  Social  History Narrative   Marital status: divorced; not dating in 2019.      Children: 4 children; 3 grandchildren adult; 4 gg.      Lives: alone in house      Employment: part-time work; substance abuse counselor; [employer].      Tobacco: quit smoking 2012; smoked 45 years      Alcohol : none  Drugs: none      Exercise: none in 2019; due to LEFT sciatica.      ADLs: independent with ADLs; drives.       Advanced Directives: YES: HCPOA: Nicholas Martinez/son.  FULL CODE but no prolonged measures.      Occupation: Substance Abuse Administrator, Civil Service   No exercise** Merged History Encounter **       ** Data from: 12/14/11 Enc Dept: [facility]       ** Data from: 12/17/11 Enc Dept: [facility]   Substance abuse counselor   Husband deceased   4 great grandchildren   Son works in same substance abuse counseling center as patient   Social Drivers of Corporate Investment Banker Strain: Medium Risk (06/02/2024)   Overall Financial Resource Strain (CARDIA)    Difficulty of Paying Living Expenses: Somewhat hard  Food Insecurity: No Food Insecurity (09/15/2024)   Hunger Vital Sign    Worried About Running Out of Food in the Last Year: Never true    Ran Out of Food in the Last Year: Never true  Transportation Needs: No Transportation Needs (09/15/2024)   PRAPARE - Administrator, Civil Service (Medical): No    Lack of Transportation (Non-Medical): No  Physical Activity: Inactive (06/02/2024)   Exercise Vital Sign    Days of Exercise per Week: 0 days    Minutes of Exercise per Session: 0 min  Stress: No Stress Concern Present (06/02/2024)   [employer] of Occupational Health - Occupational Stress Questionnaire    Feeling of Stress: Only a little  Social Connections: Moderately Integrated (09/15/2024)   Social Connection and Isolation Panel    Frequency of Communication with Friends and Family: More than three times a week     Frequency of Social Gatherings with Friends and Family: More than three times a week    Attends Religious Services: More than 4 times per year    Active Member of [employer] or Organizations: Yes    Attends Banker Meetings: More than 4 times per year    Marital Status: Widowed  Recent Concern: Social Connections - Socially Isolated (08/27/2024)   Social Connection and Isolation Panel    Frequency of Communication with Friends and Family: More than three times a week    Frequency of Social Gatherings with Friends and Family: More than three times a week    Attends Religious Services: Never    Database Administrator or Organizations: No    Attends Banker Meetings: Never    Marital Status: Divorced  Catering Manager Violence: Patient Unable To Answer (09/15/2024)   Humiliation, Afraid, Rape, and Kick questionnaire    Fear of Current or Ex-Partner: Patient unable to answer    Emotionally Abused: Patient unable to answer    Physically Abused: Patient unable to answer    Sexually Abused: Patient unable to answer   Family History  Problem Relation Age of Onset   Cancer Mother        Bladder Cancer   Hypertension Mother    CAD Mother 33   Heart disease Sister    Breast cancer Sister    Birth defects Sister    Obesity Brother    Cancer Maternal Grandmother    Heart attack Daughter 32       s/p CABG   Glaucoma Daughter    Colon cancer Neg Hx    Colon polyps  Neg Hx    Esophageal cancer Neg Hx    Rectal cancer Neg Hx    Stomach cancer Neg Hx    Vitals BP (!) 123/58   Pulse (!) 56   Temp 99.1 F (37.3 C)   Resp (!) 26   Ht 5' 3 (1.6 m)   Wt 82.8 kg   SpO2 96%   BMI 32.34 kg/m    Physical Exam Constitutional: Chronically ill-appearing elderly female lying in the bed, on BiPAP, nontoxic appearing    Comments: HEENT WNL  Cardiovascular:     Rate and Rhythm: Normal rate     Heart sounds: S1S2  Pulmonary:     Effort: Pulmonary effort is normal  BiPAP    Comments:   Abdominal:     Palpations: Abdomen is soft.     Tenderness non distended and nontender  Musculoskeletal:        General: No swelling or tenderness in peripheral joints  Skin:    Comments: No rashes  Neurological:     General: Awake, alert and oriented, answers questions appropriately  Psychiatric:        Mood and Affect: Mood normal.    Pertinent Microbiology Results for orders placed or performed during the hospital encounter of 09/15/24  Resp panel by RT-PCR (RSV, Flu A&B, Covid) Anterior Nasal Swab     Status: None   Collection Time: 09/15/24 10:08 AM   Specimen: Anterior Nasal Swab  Result Value Ref Range Status   SARS Coronavirus 2 by RT PCR NEGATIVE NEGATIVE Final    Comment: (NOTE) SARS-CoV-2 target nucleic acids are NOT DETECTED.  The SARS-CoV-2 RNA is generally detectable in upper respiratory specimens during the acute phase of infection. The lowest concentration of SARS-CoV-2 viral copies this assay can detect is 138 copies/mL. A negative result does not preclude SARS-Cov-2 infection and should not be used as the sole basis for treatment or other patient management decisions. A negative result may occur with  improper specimen collection/handling, submission of specimen other than nasopharyngeal swab, presence of viral mutation(s) within the areas targeted by this assay, and inadequate number of viral copies(<138 copies/mL). A negative result must be combined with clinical observations, patient history, and epidemiological information. The expected result is Negative.  Fact Sheet for Patients:  bloggercourse.com  Fact Sheet for Healthcare Providers:  seriousbroker.it  This test is no t yet approved or cleared by the [place]  FDA and  has been authorized for detection and/or diagnosis of SARS-CoV-2 by FDA under an Emergency Use Authorization (EUA). This EUA will remain  in effect  (meaning this test can be used) for the duration of the COVID-19 declaration under Section 564(b)(1) of the Act, 21 U.S.C.section 360bbb-3(b)(1), unless the authorization is terminated  or revoked sooner.       Influenza A by PCR NEGATIVE NEGATIVE Final   Influenza B by PCR NEGATIVE NEGATIVE Final    Comment: (NOTE) The Xpert Xpress SARS-CoV-2/FLU/RSV plus assay is intended as an aid in the diagnosis of influenza from Nasopharyngeal swab specimens and should not be used as a sole basis for treatment. Nasal washings and aspirates are unacceptable for Xpert Xpress SARS-CoV-2/FLU/RSV testing.  Fact Sheet for Patients: bloggercourse.com  Fact Sheet for Healthcare Providers: seriousbroker.it  This test is not yet approved or cleared by the [place]  FDA and has been authorized for detection and/or diagnosis of SARS-CoV-2 by FDA under an Emergency Use Authorization (EUA). This EUA will remain in effect (meaning this test can be used)  for the duration of the COVID-19 declaration under Section 564(b)(1) of the Act, 21 U.S.C. section 360bbb-3(b)(1), unless the authorization is terminated or revoked.     Resp Syncytial Virus by PCR NEGATIVE NEGATIVE Final    Comment: (NOTE) Fact Sheet for Patients: bloggercourse.com  Fact Sheet for Healthcare Providers: seriousbroker.it  This test is not yet approved or cleared by the [place]  FDA and has been authorized for detection and/or diagnosis of SARS-CoV-2 by FDA under an Emergency Use Authorization (EUA). This EUA will remain in effect (meaning this test can be used) for the duration of the COVID-19 declaration under Section 564(b)(1) of the Act, 21 U.S.C. section 360bbb-3(b)(1), unless the authorization is terminated or revoked.  Performed at [facility], [address], [place], [place] [address]   Culture,  blood (Routine x 2)     Status: Abnormal (Preliminary result)   Collection Time: 09/15/24 10:08 AM   Specimen: BLOOD  Result Value Ref Range Status   Specimen Description   Final    BLOOD RIGHT ANTECUBITAL Performed at [facility], [address], [place], [place] [address]    Special Requests   Final    BOTTLES DRAWN AEROBIC AND ANAEROBIC Blood Culture results may not be optimal due to an inadequate volume of blood received in culture bottles Performed at [facility], [address] [place], [place] [address]    Culture  Setup Time   Final    GRAM NEGATIVE RODS AEROBIC BOTTLE ONLY CRITICAL RESULT CALLED TO, READ BACK BY AND VERIFIED WITH: PHARMD L POINDEXTER 09/16/2024 @0341  BY AB    Culture (A)  Final    ACINETOBACTER CALCOACETICUS/BAUMANNII COMPLEX CULTURE REINCUBATED FOR BETTER GROWTH Performed at [facility], [address] [place], [place] [address]    Report Status PENDING  Incomplete  Respiratory (~20 pathogens) panel by PCR     Status: None   Collection Time: 09/15/24 10:08 AM   Specimen: Nasopharyngeal Swab; Respiratory  Result Value Ref Range Status   Adenovirus NOT DETECTED NOT DETECTED Final   Coronavirus 229E NOT DETECTED NOT DETECTED Final    Comment: (NOTE) The Coronavirus on the Respiratory Panel, DOES NOT test for the novel  Coronavirus (2019 nCoV)    Coronavirus HKU1 NOT DETECTED NOT DETECTED Final   Coronavirus NL63 NOT DETECTED NOT DETECTED Final   Coronavirus OC43 NOT DETECTED NOT DETECTED Final   Metapneumovirus NOT DETECTED NOT DETECTED Final   Rhinovirus / Enterovirus NOT DETECTED NOT DETECTED Final   Influenza A NOT DETECTED NOT DETECTED Final   Influenza B NOT DETECTED NOT DETECTED Final   Parainfluenza Virus 1 NOT DETECTED NOT DETECTED Final   Parainfluenza Virus 2 NOT DETECTED NOT DETECTED Final   Parainfluenza Virus 3 NOT DETECTED NOT DETECTED Final   Parainfluenza Virus 4 NOT DETECTED NOT  DETECTED Final   Respiratory Syncytial Virus NOT DETECTED NOT DETECTED Final   Bordetella pertussis NOT DETECTED NOT DETECTED Final   Bordetella Parapertussis NOT DETECTED NOT DETECTED Final   Chlamydophila pneumoniae NOT DETECTED NOT DETECTED Final   Mycoplasma pneumoniae NOT DETECTED NOT DETECTED Final    Comment: Performed at [facility], [address], [place], [place] [address]  Blood Culture ID Panel (Reflexed)     Status: Abnormal   Collection Time: 09/15/24 10:08 AM  Result Value Ref Range Status   Enterococcus faecalis NOT DETECTED NOT DETECTED Final   Enterococcus Faecium NOT DETECTED NOT DETECTED Final   Listeria  monocytogenes NOT DETECTED NOT DETECTED Final   Staphylococcus species NOT DETECTED NOT DETECTED Final   Staphylococcus aureus (BCID) NOT DETECTED NOT DETECTED Final   Staphylococcus epidermidis NOT DETECTED NOT DETECTED Final   Staphylococcus lugdunensis NOT DETECTED NOT DETECTED Final   Streptococcus species NOT DETECTED NOT DETECTED Final   Streptococcus agalactiae NOT DETECTED NOT DETECTED Final   Streptococcus pneumoniae NOT DETECTED NOT DETECTED Final   Streptococcus pyogenes NOT DETECTED NOT DETECTED Final   A.calcoaceticus-baumannii DETECTED (A) NOT DETECTED Final    Comment: CRITICAL RESULT CALLED TO, READ BACK BY AND VERIFIED WITH: PHARMD L POINDEXTER 09/16/2024 @0341  BY AB    Bacteroides fragilis NOT DETECTED NOT DETECTED Final   Enterobacterales NOT DETECTED NOT DETECTED Final   Enterobacter cloacae complex NOT DETECTED NOT DETECTED Final   Escherichia coli NOT DETECTED NOT DETECTED Final   Klebsiella aerogenes NOT DETECTED NOT DETECTED Final   Klebsiella oxytoca NOT DETECTED NOT DETECTED Final   Klebsiella pneumoniae NOT DETECTED NOT DETECTED Final   Proteus species NOT DETECTED NOT DETECTED Final   Salmonella species NOT DETECTED NOT DETECTED Final   Serratia marcescens NOT DETECTED NOT DETECTED Final   Haemophilus influenzae NOT DETECTED  NOT DETECTED Final   Neisseria meningitidis NOT DETECTED NOT DETECTED Final   Pseudomonas aeruginosa NOT DETECTED NOT DETECTED Final   Stenotrophomonas maltophilia NOT DETECTED NOT DETECTED Final   Candida albicans NOT DETECTED NOT DETECTED Final   Candida auris NOT DETECTED NOT DETECTED Final   Candida glabrata NOT DETECTED NOT DETECTED Final   Candida krusei NOT DETECTED NOT DETECTED Final   Candida parapsilosis NOT DETECTED NOT DETECTED Final   Candida tropicalis NOT DETECTED NOT DETECTED Final   Cryptococcus neoformans/gattii NOT DETECTED NOT DETECTED Final   CTX-M ESBL NOT DETECTED NOT DETECTED Final   Carbapenem resistance IMP NOT DETECTED NOT DETECTED Final   Carbapenem resistance KPC NOT DETECTED NOT DETECTED Final   Carbapenem resistance NDM NOT DETECTED NOT DETECTED Final   Carbapenem resistance VIM NOT DETECTED NOT DETECTED Final    Comment: Performed at [facility], [address] [place], [place] [address]  Culture, blood (Routine x 2)     Status: None   Collection Time: 09/15/24  1:04 PM   Specimen: BLOOD LEFT HAND  Result Value Ref Range Status   Specimen Description   Final    BLOOD LEFT HAND Performed at [facility], [address] [place], [place] [address]    Special Requests   Final    BOTTLES DRAWN AEROBIC AND ANAEROBIC Blood Culture results may not be optimal due to an inadequate volume of blood received in culture bottles Performed at [facility], [address] [place], [place] [address]    Culture   Final    NO GROWTH 5 DAYS Performed at [facility], [address] [place], [place] [address]    Report Status 09/20/2024 FINAL  Final  MRSA Next Gen by PCR, Nasal     Status: None   Collection Time: 09/16/24  1:45 PM   Specimen: Nasal Mucosa; Nasal Swab  Result Value Ref Range Status   MRSA by PCR Next Gen NOT DETECTED NOT DETECTED Final    Comment: (NOTE) The GeneXpert MRSA Assay (FDA approved for NASAL  specimens only), is one component of a comprehensive MRSA colonization surveillance program. It is not intended to diagnose MRSA infection nor to guide or monitor treatment for MRSA infections. Test performance is not FDA  approved in patients less than 30 years old. Performed at [facility], [address] [place], [place] [address]    Pertinent Lab seen by me:    Latest Ref Rng & Units 09/23/2024    3:40 AM 09/22/2024    5:00 AM 09/21/2024    5:00 AM  CBC  WBC 4.0 - 10.5 K/uL 9.0  8.7  7.4   Hemoglobin 12.0 - 15.0 g/dL 8.9  89.6  9.3   Hematocrit 36.0 - 46.0 % 27.7  32.9  28.8   Platelets 150 - 400 K/uL 130  146  107       Latest Ref Rng & Units 09/23/2024    3:40 AM 09/22/2024    5:00 AM 09/21/2024    2:03 PM  CMP  Glucose 70 - 99 mg/dL 895  889  891   BUN 8 - 23 mg/dL 60  53  47   Creatinine 0.44 - 1.00 mg/dL 6.87  7.48  7.94   Sodium 135 - 145 mmol/L 141  141  141   Potassium 3.5 - 5.1 mmol/L 4.6  4.7  4.3   Chloride 98 - 111 mmol/L 99  101  100   CO2 22 - 32 mmol/L 26  30  29    Calcium  8.9 - 10.3 mg/dL 9.2  9.3  9.3      Pertinent Imagings/Other Imagings Plain films and CT images have been personally visualized and interpreted; radiology reports have been reviewed. Decision making incorporated into the Impression / Recommendations.  DG Chest Port 1 View Result Date: 09/23/2024 CLINICAL DATA:  Pneumonia. EXAM: PORTABLE CHEST 1 VIEW COMPARISON:  09/19/2024 FINDINGS: The cardio pericardial silhouette is enlarged. Persistent bibasilar collapse/consolidation with small bilateral pleural effusions, similar to prior. Left IJ central line tip overlies the mid SVC level. Bones are diffusely demineralized. Telemetry leads overlie the chest. IMPRESSION: Persistent bibasilar collapse/consolidation with small bilateral pleural effusions. Electronically Signed   By: Camellia Candle M.D.   On: 09/23/2024 05:42   VAS US  ABI WITH/WO TBI Result Date: 09/21/2024   LOWER EXTREMITY DOPPLER STUDY Patient Name:  Tammy Ball  Date of Exam:   09/21/2024 Medical Rec #: [id number]       Accession #:    [id number] Date of Birth: [birth year]       Patient Gender: F Patient Age:   83 years Exam Location:  [facility] Procedure:      VAS US  ABI WITH/WO TBI Referring Phys: SAMMI FREDERICKS --------------------------------------------------------------------------------  Indications: Mottling of feet. High Risk         Hypertension, hyperlipidemia, prior MI, coronary artery Factors:          disease. Other Factors: Atrial fibrillation with RVR. Patient on Amiodorone and pressors.                Acute Hypoxic and Hypercapnic Respiratory Failure: likely due to                pulmonary edema and COPD exacerbation.  Comparison Study: No prior ABI on file Performing Technologist: Rachel Pellet RVS  Examination Guidelines: A complete evaluation includes at minimum, Doppler waveform signals and systolic blood pressure reading at the level of bilateral brachial, anterior tibial, and posterior tibial arteries, when vessel segments are accessible. Bilateral testing is considered an integral part of a complete examination. Photoelectric Plethysmograph (PPG) waveforms and toe systolic pressure readings are included as required and additional duplex testing as needed. Limited examinations for reoccurring indications may be  performed as noted.  ABI Findings: +---------+------------------+-----+---------+--------+ Right    Rt Pressure (mmHg)IndexWaveform Comment  +---------+------------------+-----+---------+--------+ Brachial 153                    triphasic         +---------+------------------+-----+---------+--------+ PTA      150               0.98 biphasic          +---------+------------------+-----+---------+--------+ DP       151               0.99 biphasic          +---------+------------------+-----+---------+--------+ Great Toe0                 0.00 Absent             +---------+------------------+-----+---------+--------+ +---------+------------------+-----+---------+-------+ Left     Lt Pressure (mmHg)IndexWaveform Comment +---------+------------------+-----+---------+-------+ Brachial 148                    triphasic        +---------+------------------+-----+---------+-------+ PTA      144               0.94 biphasic         +---------+------------------+-----+---------+-------+ DP       134               0.88 biphasic         +---------+------------------+-----+---------+-------+ Great Toe73                0.48 Abnormal         +---------+------------------+-----+---------+-------+ +-------+-----------+-----------+------------+------------+ ABI/TBIToday's ABIToday's TBIPrevious ABIPrevious TBI +-------+-----------+-----------+------------+------------+ Right  0.99       absent                              +-------+-----------+-----------+------------+------------+ Left   0.94       0.48                                +-------+-----------+-----------+------------+------------+  Summary: Right: Resting right ankle-brachial index is within normal range.  Absent great toe and second toe waveform/pressure. Left: Resting left ankle-brachial index indicates mild left lower extremity arterial disease. The left toe-brachial index is abnormal.  *See table(s) above for measurements and observations.  Electronically signed by Fonda Rim on 09/21/2024 at 3:14:55 PM.    Final    DG CHEST PORT 1 VIEW Result Date: 09/19/2024 EXAM: 1 VIEW(S) XRAY OF THE CHEST 09/19/2024 02:09:00 PM COMPARISON: 09/15/2024 CLINICAL HISTORY: Chest pain FINDINGS: LINES, TUBES AND DEVICES: Left internal jugular central venous catheter in place with tip overlying the expected region of the distal superior vena cava. LUNGS AND PLEURA: Left base airspace opacity. Increased interstitial markings. Small left pleural effusion. Trace right pleural  effusion. Additional right basilar opacities and volume loss suggestive of atelectasis. No pulmonary edema. No pneumothorax. HEART AND MEDIASTINUM: Mild cardiomegaly. Atherosclerotic plaque noted. BONES AND SOFT TISSUES: Cervical spine surgical hardware noted. Midthoracic spine kyphoplasty noted. IMPRESSION: 1. Small left pleural effusion. Left basilar opacities likely reflecting atelectasis. 2. Additional right basilar opacities are increased, also likely reflecting atelectasis. 3. Possible trace right pleural effusion. Electronically signed by: Donnice Mania MD 09/19/2024 06:52 PM EST RP Workstation: [id number]   CT Angio Chest Pulmonary Embolism (PE) W or WO Contrast Result Date: 09/19/2024 CLINICAL DATA:  Chest  pain with known pulmonary embolism. Clinical concern for HIT. EXAM: CT ANGIOGRAPHY CHEST WITH CONTRAST TECHNIQUE: Multidetector CT imaging of the chest was performed using the standard protocol during bolus administration of intravenous contrast. Multiplanar CT image reconstructions and MIPs were obtained to evaluate the vascular anatomy. RADIATION DOSE REDUCTION: This exam was performed according to the departmental dose-optimization program which includes automated exposure control, adjustment of the mA and/or kV according to patient size and/or use of iterative reconstruction technique. CONTRAST:  75mL OMNIPAQUE  IOHEXOL  350 MG/ML SOLN COMPARISON:  09/15/2024. FINDINGS: Cardiovascular: The heart is enlarged and there is a trace pericardial effusion. Scattered coronary artery calcifications are present. There is atherosclerotic calcification of the aorta without evidence of aneurysm. The pulmonary trunk is distended suggesting underlying pulmonary artery hypertension. Small segmental and subsegmental pulmonary emboli are present in the right upper lower, and middle lobes with decreased thrombus burden. Previously described left pulmonary emboli are not seen on this exam. There is no evidence of right  heart strain. Mediastinum/Nodes: No mediastinal, hilar, or axillary lymphadenopathy is seen. The thyroid  gland, trachea, and esophagus are within normal limits. Lungs/Pleura: There small bilateral pleural effusions, greater on the left than on the right. The left basilar consolidation is noted. Scattered airspace opacities are seen in the lungs bilaterally, possible edema or infiltrate. Emphysematous changes are present in the lungs. No pneumothorax is seen. Upper Abdomen: Hyperdense material is noted in the gallbladder, possible stones or sludge. The left kidney is not seen. Anasarca is noted. Musculoskeletal: Cervical spinal fusion hardware is present. Degenerative changes are noted in the thoracic spine. There are compression deformities in the thoracic spine with kyphoplasty changes. No acute osseous abnormality is seen. Review of the MIP images confirms the above findings. IMPRESSION: 1. Right-sided pulmonary emboli, decreased in volume. No obvious pulmonary emboli in the left lung. No evidence of right heart strain. 2. Left lower lobe consolidation, increased from the prior exam. 3. Scattered airspace opacities in the lungs bilaterally, possible edema or infiltrate. 4. Small bilateral pleural effusions. 5. Emphysema. 6. Aortic atherosclerosis and coronary artery calcifications. 7. Mild anasarca. Electronically Signed   By: Leita Birmingham M.D.   On: 09/19/2024 17:08   DG Abd 1 View Result Date: 09/18/2024 EXAM: 1 VIEW XRAY OF THE ABDOMEN 09/18/2024 07:08:43 PM COMPARISON: Abdominal x-ray 05/15/2024, CT GI bleed 09/17/2024. CLINICAL HISTORY: Abdominal pain. FINDINGS: LINES, TUBES AND DEVICES: Enteric tube tip is in the body of the stomach. BOWEL: Nonobstructive bowel gas pattern. No dilated bowel loops are identified. SOFT TISSUES: No opaque urinary calculi. BONES: No acute osseous abnormality. IMPRESSION: 1. No bowel obstruction. Electronically signed by: Greig Pique MD 09/18/2024 11:12 PM EST RP  Workstation: [id number]   VAS US  GROIN PSEUDOANEURYSM Result Date: 09/18/2024  ARTERIAL PSEUDOANEURYSM  Patient Name:  Tammy Ball  Date of Exam:   09/18/2024 Medical Rec #: [id number]       Accession #:    [id number] Date of Birth: 04-06-1941       Patient Gender: F Patient Age:   35 years Exam Location:  [facility] Procedure:      VAS US  QUILLIAN CAPES Referring Phys: SAMMI FREDERICKS --------------------------------------------------------------------------------  Exam: Right groin Indications: Patient complains of Bleeding from right femoral arterial line with drop in hemoglobin. CT suggest small femoral pseudoaneurysm. Limitations: Tissue properties. Body habitus. Comparison Study: No prior study on file Performing Technologist: Alberta Lis RVS  Examination Guidelines: A complete evaluation includes B-mode imaging, spectral Doppler, color Doppler, and power Doppler as needed  of all accessible portions of each vessel. Bilateral testing is considered an integral part of a complete examination. Limited examinations for reoccurring indications may be performed as noted.  Findings: An area with well defined borders measuring 1.6 cm x 2.1 cm was visualized arising off of the CFA with ultrasound characteristics of a partially thrombosed pseudoaneurysm.  Diagnosing physician: Debby Robertson Electronically signed by Debby Robertson on 09/18/2024 at 4:16:31 PM.   --------------------------------------------------------------------------------    Final    CT ANGIO AO+BIFEM W & OR WO CONTRAST Result Date: 09/17/2024 CLINICAL DATA:  Bleeding from right femoral arterial line site overnight with drop in hemoglobin and overlying ecchymosis. EXAM: CT ANGIOGRAPHY OF ABDOMINAL AORTA WITH ILIOFEMORAL RUNOFF TECHNIQUE: Multidetector CT imaging of the abdomen, pelvis and lower extremities was performed using the standard protocol during bolus administration of intravenous contrast. Multiplanar CT image reconstructions  and MIPs were obtained to evaluate the vascular anatomy. RADIATION DOSE REDUCTION: This exam was performed according to the departmental dose-optimization program which includes automated exposure control, adjustment of the mA and/or kV according to patient size and/or use of iterative reconstruction technique. CONTRAST:  OMNIPAQUE  IOHEXOL  350 MG/ML SOLN COMPARISON:  CTA abdomen and pelvis earlier today. FINDINGS: VASCULAR Aorta: Stable atherosclerosis without aneurysm.  Normally patent. Celiac: Normally patent. SMA: Normally patent. Renals: Normally patent single right renal artery. Left renal artery ligated after prior nephrectomy. IMA: Normally patent. RIGHT Lower Extremity Inflow: Patent iliac arteries with atherosclerosis. No aneurysm or significant obstructive disease. Outflow: As noted on the CTA pelvis, there is a probable small pseudoaneurysm along the inferior aspect of the femoral arterial insertion into the common femoral artery. This is better delineated and described on the CTA abdomen pelvis report. No overt extravasation of contrast material other than the focal outpouching anterior to the common femoral artery. Normally patent SFA and profunda femoral arteries. Small but patent popliteal artery. Runoff: Continuously patent posterior tibial artery into the foot. Small peroneal artery to be followed to just above the ankle. Anterior tibial artery is patent into the foot. LEFT Lower Extremity Inflow: Patent iliac arteries with atherosclerosis. No aneurysm or significant obstructive disease. Outflow: Normally patent common femoral artery and femoral bifurcation. Normally patent SFA and profunda femoral arteries. Patent popliteal artery. Runoff: Continuously patent posterior tibial and anterior tibial arteries into the foot. The peroneal artery is small in caliber and is difficult to visualize beyond the distal calf. Review of the MIP images confirms the above findings. NON-VASCULAR See CTA abdomen  pelvis. IMPRESSION: 1. Probable small pseudoaneurysm along the inferior aspect of the right femoral arterial insertion into the common femoral artery. This is better delineated and described on the CTA abdomen pelvis report. No overt extravasation of contrast material other than the focal outpouching anterior to the common femoral artery. 2. Atherosclerosis of the abdominal aorta and iliac arteries without aneurysm or significant obstructive disease. 3. Continuously patent posterior tibial and anterior tibial arteries into both feet. The peroneal arteries are small in caliber and are difficult to visualize beyond the distal calves. Electronically Signed   By: Marcey Moan M.D.   On: 09/17/2024 15:32   CT ANGIO GI BLEED Result Date: 09/17/2024 CLINICAL DATA:  Bleeding from right femoral arterial line site overnight with drop in hemoglobin and overlying ecchymosis. EXAM: CTA ABDOMEN AND PELVIS WITHOUT AND WITH CONTRAST TECHNIQUE: Multidetector CT imaging of the abdomen and pelvis was performed using the standard protocol during bolus administration of intravenous contrast. Multiplanar reconstructed images and MIPs were obtained and reviewed  to evaluate the vascular anatomy. RADIATION DOSE REDUCTION: This exam was performed according to the departmental dose-optimization program which includes automated exposure control, adjustment of the mA and/or kV according to patient size and/or use of iterative reconstruction technique. CONTRAST:  OMNIPAQUE  IOHEXOL  350 MG/ML SOLN COMPARISON:  CT abdomen pelvis 08/22/2024 FINDINGS: VASCULAR Aorta: Stable atherosclerosis without aneurysm. Normal patency. No dissection Celiac: Normally patent. Normally patent branch vessels and branching anatomy. SMA: Normally patent. Renals: Normally patent single right renal artery. Ligated left renal artery after prior left nephrectomy. IMA: Normally patent. Inflow: Iliac atherosclerosis with normal bilateral iliac arterial patency  and no evidence significant obstructive disease or aneurysmal disease. Proximal Outflow: Small caliber arterial line enters the right common femoral artery at roughly the level of the inguinal ligament. On the venous phase of imaging, there is evidence of a probable small femoral pseudoaneurysm just inferior to the arterial line entry site measuring approximately 8 mm in diameter. There is associated hemorrhage and ecchymosis primarily superficial to the femoral artery but also extending into the right pelvic extraperitoneal space. No significant intra-abdominal retroperitoneal hemorrhage. Veins: Normal patency of venous structures in the abdomen and pelvis. Review of the MIP images confirms the above findings. NON-VASCULAR Lower chest: Atelectasis/consolidation of the left lower lobe with associated small left pleural effusion. Hepatobiliary: Periportal edema in the liver. No focal lesion. There is some fluid around the gallbladder in the gallbladder fossa. No significant gallbladder distension. No biliary ductal dilatation. Pancreas: Unremarkable. No pancreatic ductal dilatation or surrounding inflammatory changes. Spleen: Normal in size without focal abnormality. Adrenals/Urinary Tract: Unremarkable appearance of solitary right kidney. Status post left nephrectomy. No adrenal masses. The bladder is decompressed by a Foley catheter. Stomach/Bowel: Bowel shows no evidence of obstruction, ileus, inflammation or lesion. No active gastrointestinal bleeding. The appendix is not discretely visualized. No free intraperitoneal air. Lymphatic: No lymphadenopathy identified in the abdomen or pelvis. Reproductive: Uterus and bilateral adnexa are unremarkable. Other: Small amount of free fluid in the dependent pelvis. Small bilateral inguinal hernias containing fat. Musculoskeletal: No acute or significant osseous findings. IMPRESSION: 1. Small caliber arterial line enters the right common femoral artery at roughly the level  of the inguinal ligament. On the venous phase of imaging, there is evidence of a probable small femoral pseudoaneurysm just inferior to the arterial line entry site measuring approximately 8 mm in diameter. This could be confirmed by arterial duplex ultrasound. 2. There is associated hemorrhage and ecchymosis primarily superficial to the femoral artery but also extending into the right pelvic extraperitoneal space. No significant intra-abdominal retroperitoneal hemorrhage. 3. Atelectasis/consolidation of the left lower lobe with associated small left pleural effusion. 4. Periportal edema in the liver. 5. Fluid surrounding the gallbladder in the gallbladder fossa without significant gallbladder distension. Cannot exclude acute cholecystitis. Clinical correlation suggested. Consider right upper quadrant ultrasound. 6. Small amount of free fluid in the dependent pelvis. 7. Small bilateral inguinal hernias containing fat. 8. Aortic atherosclerosis without aneurysm. Electronically Signed   By: Marcey Moan M.D.   On: 09/17/2024 10:19   DG CHEST PORT 1 VIEW Result Date: 09/15/2024 EXAM: 1 VIEW(S) XRAY OF THE CHEST 09/15/2024 11:16:00 PM COMPARISON: 09/15/2024 CLINICAL HISTORY: Encounter for imaging study to confirm orogastric (OG) tube placement. FINDINGS: LINES, TUBES AND DEVICES: Tip of the vascular catheter overlies the cavoatrial region. Enteric tube tip below the diaphragm but incompletely visualized. LUNGS AND PLEURA: Lung apices are not included. Pleural effusions and airspace disease left base. No pulmonary edema. No pneumothorax. HEART AND  MEDIASTINUM: No acute abnormality of the cardiac and mediastinal silhouettes. BONES AND SOFT TISSUES: No acute osseous abnormality. IMPRESSION: 1. Enteric tube tip below the diaphragm, incompletely visualized. 2. Vascular catheter tip at the cavoatrial region. 3. Left basilar pleural effusion and airspace disease. Electronically signed by: Luke Bun MD 09/15/2024 11:23  PM EST RP Workstation: [id number]   DG Abd 1 View Result Date: 09/15/2024 EXAM: 1 VIEW XRAY OF THE ABDOMEN 09/15/2024 08:18:00 PM COMPARISON: CT 08/22/2024 CLINICAL HISTORY: Encounter for orogastric (OG) tube placement. FINDINGS: LINES, TUBES AND DEVICES: Enteric tube tip projects over proximal stomach. Side port in the region of the GE junction. BOWEL: The visible gas pattern is unremarkable. SOFT TISSUES: No opaque urinary calculi. BONES: No acute osseous abnormality. IMPRESSION: 1. Enteric tube with tip in the proximal stomach; side port at the gastroesophageal junction. Consider advancing by 5 to 10 cm for more optimal positioning. 2. Nonobstructive bowel gas pattern. Electronically signed by: Luke Bun MD 09/15/2024 08:28 PM EST RP Workstation: [id number]   DG CHEST PORT 1 VIEW Result Date: 09/15/2024 EXAM: 1 VIEW(S) XRAY OF THE CHEST 09/15/2024 07:24:00 PM COMPARISON: 09/15/2024 CLINICAL HISTORY: Encounter for central line placement [id number]; Endotracheally intubated [id number]. FINDINGS: LINES, TUBES AND DEVICES: Endotracheal tube tip is 5 cm above the carina. NG tube enters the stomach. Left central line tip at the cavoatrial junction. LUNGS AND PLEURA: Layering left pleural effusion. Left lower lobe airspace disease concerning for pneumonia, similar to prior study. Right basilar atelectasis or infiltrate. No pulmonary edema. No pneumothorax. HEART AND MEDIASTINUM: No acute abnormality of the cardiac and mediastinal silhouettes. BONES AND SOFT TISSUES: No acute osseous abnormality. IMPRESSION: 1. Left lower lobe airspace disease concerning for pneumonia, similar to prior study. 2. Layering left pleural effusion. 3. Right basilar atelectasis or infiltrate. Electronically signed by: Franky Crease MD 09/15/2024 07:34 PM EST RP Workstation: [id number]   VAS US  LOWER EXTREMITY VENOUS (DVT) Result Date: 09/15/2024  Lower Venous DVT Study Patient Name:  Tammy Ball  Date of Exam:   09/15/2024 Medical Rec #:  [id number]       Accession #:    [id number] Date of Birth: 1940-11-16       Patient Gender: F Patient Age:   48 years Exam Location:  [facility] Procedure:      VAS US  LOWER EXTREMITY VENOUS (DVT) Referring Phys: JOHN PAYNE --------------------------------------------------------------------------------  Indications: Pulmonary embolism. Other Indications: Lung cancer s/p lobectomy and cehmo, reneal cell carcinoma w/                    left nephrectomy, diastolic chf, COPD. Anticoagulation: Heparin . Comparison Study: No priors. Performing Technologist: Ricka Sturdivant-Jones RDMS, RVT  Examination Guidelines: A complete evaluation includes B-mode imaging, spectral Doppler, color Doppler, and power Doppler as needed of all accessible portions of each vessel. Bilateral testing is considered an integral part of a complete examination. Limited examinations for reoccurring indications may be performed as noted. The reflux portion of the exam is performed with the patient in reverse Trendelenburg.  +---------+---------------+---------+-----------+----------+--------------+ RIGHT    CompressibilityPhasicitySpontaneityPropertiesThrombus Aging +---------+---------------+---------+-----------+----------+--------------+ CFV      Full           Yes      No                                  +---------+---------------+---------+-----------+----------+--------------+ SFJ      Full                                                        +---------+---------------+---------+-----------+----------+--------------+  FV Prox  Full                                                        +---------+---------------+---------+-----------+----------+--------------+ FV Mid   Full           Yes      Yes                                 +---------+---------------+---------+-----------+----------+--------------+ FV DistalFull                                                         +---------+---------------+---------+-----------+----------+--------------+ PFV      Full                                                        +---------+---------------+---------+-----------+----------+--------------+ POP      Full           Yes      Yes                                 +---------+---------------+---------+-----------+----------+--------------+ PTV      Full                                                        +---------+---------------+---------+-----------+----------+--------------+ PERO     None                                         Acute          +---------+---------------+---------+-----------+----------+--------------+   +---------+---------------+---------+-----------+----------+--------------+ LEFT     CompressibilityPhasicitySpontaneityPropertiesThrombus Aging +---------+---------------+---------+-----------+----------+--------------+ CFV      Full           Yes      Yes                                 +---------+---------------+---------+-----------+----------+--------------+ SFJ      Full                                                        +---------+---------------+---------+-----------+----------+--------------+ FV Prox  Full                                                        +---------+---------------+---------+-----------+----------+--------------+  FV Mid   Full           Yes      Yes                                 +---------+---------------+---------+-----------+----------+--------------+ FV DistalFull                                                        +---------+---------------+---------+-----------+----------+--------------+ PFV      Full                                                        +---------+---------------+---------+-----------+----------+--------------+ POP      Full           Yes      Yes                                  +---------+---------------+---------+-----------+----------+--------------+ PTV      Full                                                        +---------+---------------+---------+-----------+----------+--------------+ PERO     Full                                                        +---------+---------------+---------+-----------+----------+--------------+     Summary: RIGHT: - Findings consistent with acute deep vein thrombosis involving the right peroneal veins.  - All other veins are patent.  LEFT: - There is no evidence of deep vein thrombosis in the lower extremity.  - No cystic structure found in the popliteal fossa.  *See table(s) above for measurements and observations. Electronically signed by Penne Colorado MD on 09/15/2024 at 3:35:09 PM.    Final    ECHOCARDIOGRAM COMPLETE Result Date: 09/15/2024    ECHOCARDIOGRAM REPORT   Patient Name:   Tammy Ball Date of Exam: 09/15/2024 Medical Rec #:  [id number]      Height:       63.0 in Accession #:    [id number]     Weight:       152.0 lb Date of Birth:  1941-02-23      BSA:          1.721 m Patient Age:    82 years       BP:           89/65 mmHg Patient Gender: F              HR:           89 bpm. Exam Location:  Inpatient Procedure: 2D Echo, Cardiac Doppler and Color Doppler (Both Spectral and Color  Flow Doppler were utilized during procedure). Indications:    PE I26.09  History:        Patient has prior history of Echocardiogram examinations, most                 recent 08/27/2024.  Sonographer:    Tinnie Gosling RDCS Referring Phys: [id number] NORLEEN BIRCH PAYNE  Sonographer Comments: Technically difficult study due to poor echo windows. Image acquisition challenging due to COPD and Image acquisition challenging due to respiratory motion. IMPRESSIONS  1. Left ventricular ejection fraction, by estimation, is 70 to 75%. The left ventricle has hyperdynamic function. The left ventricle has no regional wall motion abnormalities. There is  mild left ventricular hypertrophy. Left ventricular diastolic parameters are indeterminate.  2. RV mid and apex not well seen; normal annular excursion. Right ventricular systolic function is normal. The right ventricular size is normal.  3. The mitral valve is grossly normal. No evidence of mitral valve regurgitation. No evidence of mitral stenosis.  4. The aortic valve was not well visualized. Aortic valve regurgitation is not visualized. No aortic stenosis is present. Comparison(s): Prior images reviewed side by side. Difficult comparison- this study is more technically challenging. FINDINGS  Left Ventricle: Intracavitary gradient related to dynamnic flow. Left ventricular ejection fraction, by estimation, is 70 to 75%. The left ventricle has hyperdynamic function. The left ventricle has no regional wall motion abnormalities. The left ventricular internal cavity size was normal in size. There is mild left ventricular hypertrophy. Left ventricular diastolic parameters are indeterminate. Right Ventricle: RV mid and apex not well seen; normal annular excursion. The right ventricular size is normal. No increase in right ventricular wall thickness. Right ventricular systolic function is normal. Left Atrium: Left atrial size was normal in size. Right Atrium: Right atrial size was normal in size. Pericardium: There is no evidence of pericardial effusion. Mitral Valve: The mitral valve is grossly normal. No evidence of mitral valve regurgitation. No evidence of mitral valve stenosis. Tricuspid Valve: The tricuspid valve is grossly normal. Tricuspid valve regurgitation is not demonstrated. No evidence of tricuspid stenosis. Aortic Valve: The aortic valve was not well visualized. Aortic valve regurgitation is not visualized. No aortic stenosis is present. Pulmonic Valve: The pulmonic valve was not well visualized. Pulmonic valve regurgitation is not visualized. No evidence of pulmonic stenosis. Aorta: The aortic root and  ascending aorta are structurally normal, with no evidence of dilitation. IAS/Shunts: The interatrial septum was not well visualized.  LEFT VENTRICLE PLAX 2D LVIDd:         2.90 cm   Diastology LVIDs:         1.10 cm   LV e' medial:    4.24 cm/s LV PW:         1.30 cm   LV E/e' medial:  21.2 LV IVS:        1.30 cm   LV e' lateral:   4.24 cm/s LVOT diam:     1.90 cm   LV E/e' lateral: 21.2 LV SV:         85 LV SV Index:   49 LVOT Area:     2.84 cm LV IVRT:       132 msec  RIGHT VENTRICLE             IVC RV S prime:     15.20 cm/s  IVC diam: 1.80 cm TAPSE (M-mode): 1.6 cm LEFT ATRIUM           Index  RIGHT ATRIUM           Index LA diam:      2.70 cm 1.57 cm/m   RA Area:     21.00 cm LA Vol (A4C): 50.1 ml 29.11 ml/m  RA Volume:   55.20 ml  32.08 ml/m  AORTIC VALVE LVOT Vmax:   156.00 cm/s LVOT Vmean:  110.000 cm/s LVOT VTI:    0.299 m  AORTA Ao Root diam: 2.70 cm Ao Asc diam:  3.20 cm MITRAL VALVE MV Area (PHT): 4.71 cm    SHUNTS MV Decel Time: 161 msec    Systemic VTI:  0.30 m MV E velocity: 90.00 cm/s  Systemic Diam: 1.90 cm MV A velocity: 86.80 cm/s MV E/A ratio:  1.04 Stanly Leavens MD Electronically signed by Stanly Leavens MD Signature Date/Time: 09/15/2024/2:40:30 PM    Final    CT Angio Chest PE W and/or Wo Contrast Result Date: 09/15/2024 CLINICAL DATA:  Cough which shortness-of-breath and body aches since this morning. High probability of pulmonary emboli. EXAM: CT ANGIOGRAPHY CHEST WITH CONTRAST TECHNIQUE: Multidetector CT imaging of the chest was performed using the standard protocol during bolus administration of intravenous contrast. Multiplanar CT image reconstructions and MIPs were obtained to evaluate the vascular anatomy. RADIATION DOSE REDUCTION: This exam was performed according to the departmental dose-optimization program which includes automated exposure control, adjustment of the mA and/or kV according to patient size and/or use of iterative reconstruction technique.  CONTRAST:  60mL OMNIPAQUE  IOHEXOL  350 MG/ML SOLN COMPARISON:  08/22/2024 FINDINGS: Cardiovascular: Mild stable cardiomegaly. Mild calcified plaque over the left main, left anterior descending and left lateral circumflex coronary arteries. Thoracic aorta is normal in caliber. There is mild calcified plaque over the thoracic aorta. Pulmonary arterial system is well opacified and demonstrates multiple emboli over the segmental right pulmonary arteries involving upper middle and lower lobar arteries. Left upper lobe segmental pulmonary artery emboli noted. RV/LV ratio is greater than 1 compatible with right heart strain. Mediastinum/Nodes: No mediastinal or hilar adenopathy. Postsurgical changes over the right hilar region. Remaining mediastinal structures are unremarkable. Lungs/Pleura: Lungs are adequately inflated demonstrate mild centrilobular emphysematous disease. New mild triangular for pleural based opacification over the right lower lobe with associated linear density which may represent atelectasis or infarction. New moderate consolidation over the left lower lobe with associated small left pleural effusion. Findings likely due to atelectasis although infection is possible. Airways are unremarkable. Upper Abdomen: No acute findings. Moderate calcified plaque over the abdominal aorta. Musculoskeletal: Multiple stable compression fractures over the cervicothoracic junction and upper thoracic spine. Patient has undergone interval kyphoplasty of the more inferior thoracic spine compression fracture. Review of the MIP images confirms the above findings. IMPRESSION: 1. Multiple bilateral segmental pulmonary artery emboli with evidence of right heart strain. 2. New mild triangular pleural based opacification over the right lower lobe with associated linear density which may represent atelectasis or infarction. 3. New moderate consolidation over the left lower lobe with associated small left pleural effusion. Findings  likely due to atelectasis, although infection is possible. 4. Mild stable cardiomegaly. Atherosclerotic coronary artery disease. 5. Multiple stable compression fractures over the cervicothoracic junction and upper thoracic spine. Interval kyphoplasty of the more inferior thoracic spine compression fracture. Aortic Atherosclerosis (ICD10-I70.0) and Emphysema (ICD10-J43.9). Critical Value/emergent results were called by telephone at the time of interpretation on 09/15/2024 at 12:39 p.m. to provider Dr. Elnor, who verbally acknowledged these results. Electronically Signed   By: Toribio Agreste M.D.   On: 09/15/2024 12:39  DG Chest 2 View Result Date: 09/15/2024 EXAM: 2 VIEW(S) XRAY OF THE CHEST 09/15/2024 09:56:23 AM COMPARISON: 08/29/2024 CLINICAL HISTORY: shob FINDINGS: LUNGS AND PLEURA: Mild left basilar airspace opacities slightly increased. Small left pleural effusion. No pulmonary edema. No pneumothorax. HEART AND MEDIASTINUM: Cardiomegaly, unchanged. BONES AND SOFT TISSUES: Interval T7 thoracic vertebral augmentation. Stable postoperative changes of T3 and T4 cement augmentation. Partially imaged cervical spinal fusion hardware in place. IMPRESSION: 1. Mildly increased left basilar airspace opacities, which may reflect atelectasis or developing pneumonia. 2. Small left pleural effusion. 3. Cardiomegaly, unchanged. Electronically signed by: Dayne Hassell MD 09/15/2024 10:45 AM EST RP Workstation: [id number]   IR KYPHO THORACIC WITH BONE BIOPSY Result Date: 09/01/2024 CLINICAL DATA:  Acute T7 compression fracture Briefly, 83 year old female with acute, symptomatic thoracic vertebral body compression fractures, greatest at T7. Additional fractures on MRI at T5 and T6, to a mild degree. Failed conservative management EXAM: T7 VERTEBRAL BODY AUGMENTATION WITH BALLOON KYPHOPLASTY COMPARISON:  CT T-spine, 08/22/2024.  MR T-spine, 08/27/2024. MEDICATIONS: As antibiotic prophylaxis, Ancef  2 gm IV was ordered  pre-procedure and administered intravenously within 1 hour of incision. ANESTHESIA/SEDATION: Moderate (conscious) sedation was employed during this procedure. A total of Versed  1.5 mg and Fentanyl  100 mcg was administered intravenously. Moderate Sedation Time: 34 minutes. The patient's level of consciousness and vital signs were monitored continuously by radiology nursing throughout the procedure under my direct supervision. FLUOROSCOPY: Radiation Exposure Index and estimated peak skin dose (PSD); Reference air kerma (RAK), 187 mGy. COMPLICATIONS: None immediate. PROCEDURE: The procedure, risks (including but not limited to bleeding, infection, organ damage), benefits, and alternatives were explained to the patient and/or patient's representative. Questions regarding the procedure were encouraged and answered. The patient understands and consents to the procedure. The patient was placed prone on the fluoroscopic table. The skin overlying the lower lumbar spine region was then prepped and draped in the usual sterile fashion. Maximal barrier sterile technique was utilized including caps, mask, sterile gowns, sterile gloves, sterile drape, hand hygiene and skin antiseptic. Intravenous Fentanyl  and Versed  were administered as conscious sedation during continuous cardiorespiratory monitoring by the radiology RN. The LEFT pedicle at T7 was then infiltrated with 0.5% Marcaine  followed by the advancement of a Kyphon trocar needle through the pedicle into the posterior one-third of the vertebral body. The osteo drill was advanced to the anterior third of the vertebral body. The osteo drill was retracted. Through the working cannula, a Kyphon inflatable bone tamp 15 x 3 was advanced and positioned with the distal marker approximately 5 mm from the anterior aspect of the cortex. Appropriate positioning was confirmed on the AP projection. At this time, the balloon was expanded using contrast via a Kyphon inflation syringe  device via micro tubing. In similar fashion, the RIGHT T7 pedicle was infiltrated with 0.5% Marcaine  followed by the advancement of a second Kyphon trocar needle through the right pedicle into the posterior third of the vertebral body. Subsequently, the osteo drill was coaxially advanced to the anterior right third. The osteo drill was exchanged for a Kyphon inflatable bone tamp 15 x 3, advanced to the 5 mm of the anterior aspect of the cortex. The balloon was then expanded using contrast as above. Inflations were continued until there was near apposition with the superior end plate. At this time, methylmethacrylate mixture was reconstituted in the Kyphon bone mixing device system. This was then loaded into the delivery mechanism, attached to Kyphon bone fillers. The balloons were deflated and removed followed by the  instillation of methylmethacrylate mixture with excellent filling in the AP and lateral projections. No extravasation was noted in the disk spaces or posteriorly into the spinal canal. No epidural venous contamination was seen. The working cannulae and the bone filler were then retrieved and removed. Hemostasis was achieved with manual compression. The patient tolerated the procedure well without immediate postprocedural complication. FINDINGS: *Adequate cement filling of the T7 vertebral body on both the AP and lateral projections. *No extravasation was noted in the disk spaces or posteriorly into the spinal canal, nor epidural venous contamination was seen. *The patient has suffered a fractures of T5, T6 and T7. It is recommended that patients aged 77 years or older be evaluated for possible testing or treatment of osteoporosis. A copy of this procedure report is made available to the patient's referring physician IMPRESSION: Successful T7 vertebral body cement augmentation with balloon kyphoplasty. Per CMS PQRS reporting requirements (PQRS Measure 24): Given the patient's age of greater than 50 and  the fracture site (hip, distal radius, or spine), the patient should be tested for osteoporosis using DXA, and the appropriate treatment considered based on the DXA result. RECOMMENDATIONS: Should the patient fail to achieve adequate pain relief from her thoracic vertebral body compression deformities after this augmentation, kyphoplasty at T5 and T6 levels could be pursued. Thom Hall, MD [facility] Electronically Signed   By: Thom Hall M.D.   On: 09/01/2024 17:07   DG CHEST PORT 1 VIEW Result Date: 08/29/2024 CLINICAL DATA:  Shortness of breath. EXAM: PORTABLE CHEST 1 VIEW COMPARISON:  08/26/2024 FINDINGS: The cardio pericardial silhouette is enlarged. Interval progression of right base atelectasis/infiltrate and mildly increased small right pleural effusion. Similar collapse/consolidation at the left base. Diffuse interstitial opacity is likely chronic. Changes of upper thoracic vertebral augmentation evident. Telemetry leads overlie the chest. IMPRESSION: 1. Interval progression of right base atelectasis/infiltrate and mildly increased small right pleural effusion. 2. Similar collapse/consolidation at the left base. Electronically Signed   By: Camellia Candle M.D.   On: 08/29/2024 09:13   MR THORACIC SPINE WO CONTRAST Result Date: 08/28/2024 EXAM: MRI THORACIC SPINE WITHOUT INTRAVENOUS CONTRAST 08/27/2024 07:11:45 PM TECHNIQUE: Multiplanar multisequence MRI of the thoracic spine was performed without the administration of intravenous contrast. COMPARISON: MRI of the thoracic spine dated 12/21/2016 and CT of the thoracic spine dated 08/22/2024. CLINICAL HISTORY: Compression fracture, thoracic. FINDINGS: BONES AND ALIGNMENT: Normal alignment except for focal kyphosis at T4. There is a mild-to-moderate compression deformity of T7. Increased T2 signal is present throughout the T7 vertebral body, indicating the fracture is likely acute or subacute.  Bone marrow edema is present anteriorly within the T5 and T6 vertebral bodies. There are chronic compression deformities of T3 and T4, which have been treated with augmentation. The T4 vertebral body has lost approximately 75% of its height anteriorly, contributing to focal kyphosis as before. Um Bone marrow signal is unremarkable except for the findings described above. The patient is status post ACDF at C6-C7. SPINAL CORD: Normal spinal cord volume. Normal spinal cord signal. SOFT TISSUES: Bilateral pleural effusions, right larger than left. There is airspace consolidation within the left upper lobe. Other visualized soft tissues are unremarkable. DEGENERATIVE CHANGES: No significant disc herniation. No spinal canal stenosis or neural foraminal narrowing. IMPRESSION: 1. Mild-to-moderate compression deformity of T7 with increased T2 signal, likely acute or subacute. 2. Bone marrow edema anteriorly within the T5 and T6 vertebral bodies. 3. Chronic compression deformities of T3 and T4, treated with  augmentation, with focal kyphosis at T4 as before. 4. Bilateral pleural effusions, right larger than left. 5. Airspace consolidation within the left upper lobe. Electronically signed by: Evalene Coho MD 08/28/2024 04:42 AM EDT RP Workstation: [id number]   ECHOCARDIOGRAM COMPLETE Result Date: 08/27/2024    ECHOCARDIOGRAM REPORT   Patient Name:   Tammy Ball Date of Exam: 08/27/2024 Medical Rec #:  [id number]      Height:       63.0 in Accession #:    [id number]     Weight:       164.2 lb Date of Birth:  September 29, 1941      BSA:          1.778 m Patient Age:    82 years       BP:           161/78 mmHg Patient Gender: F              HR:           84 bpm. Exam Location:  Inpatient Procedure: 2D Echo, Cardiac Doppler and Color Doppler (Both Spectral and Color            Flow Doppler were utilized during procedure). Indications:    CHF I50.31  History:        Patient has prior history of Echocardiogram examinations, most                  recent 06/10/2022. CHF, Previous Myocardial Infarction, COPD;                 Risk Factors:Hypertension. H/O Renal cell cancer.  Sonographer:    BERNARDA ROCKS Referring Phys: [id number] MOHAMMAD L GARBA IMPRESSIONS  1. Left ventricular ejection fraction, by estimation, is 60 to 65%. The left ventricle has normal function. The left ventricle has no regional wall motion abnormalities. There is mild concentric left ventricular hypertrophy. Left ventricular diastolic parameters are consistent with Grade II diastolic dysfunction (pseudonormalization). Elevated left atrial pressure.  2. Right ventricular systolic function is normal. The right ventricular size is normal. There is moderately elevated pulmonary artery systolic pressure.  3. The mitral valve is normal in structure. Mild mitral valve regurgitation. No evidence of mitral stenosis.  4. The aortic valve is calcified. There is moderate calcification of the aortic valve. Aortic valve regurgitation is not visualized. Mild aortic valve stenosis.  5. Abdominal aorta is normal size.  6. The inferior vena cava is normal in size with greater than 50% respiratory variability, suggesting right atrial pressure of 3 mmHg. FINDINGS  Left Ventricle: Left ventricular ejection fraction, by estimation, is 60 to 65%. The left ventricle has normal function. The left ventricle has no regional wall motion abnormalities. The left ventricular internal cavity size was normal in size. There is  mild concentric left ventricular hypertrophy. Left ventricular diastolic parameters are consistent with Grade II diastolic dysfunction (pseudonormalization). Elevated left atrial pressure. Right Ventricle: The right ventricular size is normal. No increase in right ventricular wall thickness. Right ventricular systolic function is normal. There is moderately elevated pulmonary artery systolic pressure. The tricuspid regurgitant velocity is 3.61 m/s, and with an assumed right atrial pressure  of 3 mmHg, the estimated right ventricular systolic pressure is 55.1 mmHg. Left Atrium: Left atrial size was normal in size. Right Atrium: Right atrial size was normal in size. Pericardium: Trivial pericardial effusion is present. The pericardial effusion is circumferential. Mitral Valve: The mitral valve is normal in structure. Mild mitral valve regurgitation. No  evidence of mitral valve stenosis. MV peak gradient, 7.7 mmHg. The mean mitral valve gradient is 3.0 mmHg. Tricuspid Valve: The tricuspid valve is normal in structure. Tricuspid valve regurgitation is not demonstrated. No evidence of tricuspid stenosis. Aortic Valve: The aortic valve is calcified. There is moderate calcification of the aortic valve. There is moderate aortic valve annular calcification. Aortic valve regurgitation is not visualized. Mild aortic stenosis is present. Aortic valve mean gradient measures 9.3 mmHg. Aortic valve peak gradient measures 21.0 mmHg. Aortic valve area, by VTI measures 1.82 cm. Pulmonic Valve: The pulmonic valve was normal in structure. Pulmonic valve regurgitation is not visualized. No evidence of pulmonic stenosis. Aorta: Abdominal aorta is normal size. The aortic root is normal in size and structure. Venous: The inferior vena cava is normal in size with greater than 50% respiratory variability, suggesting right atrial pressure of 3 mmHg. IAS/Shunts: No atrial level shunt detected by color flow Doppler.  LEFT VENTRICLE PLAX 2D LVIDd:         3.00 cm     Diastology LVIDs:         1.80 cm     LV e' medial:    5.22 cm/s LV PW:         1.10 cm     LV E/e' medial:  20.5 LV IVS:        1.20 cm     LV e' lateral:   7.51 cm/s LVOT diam:     1.80 cm     LV E/e' lateral: 14.2 LV SV:         76 LV SV Index:   43 LVOT Area:     2.54 cm  LV Volumes (MOD) LV vol d, MOD A2C: 72.1 ml LV vol d, MOD A4C: 73.2 ml LV vol s, MOD A2C: 16.4 ml LV vol s, MOD A4C: 21.0 ml LV SV MOD A2C:     55.7 ml LV SV MOD A4C:     73.2 ml LV SV MOD BP:       54.7 ml RIGHT VENTRICLE             IVC RV Basal diam:  3.10 cm     IVC diam: 1.60 cm RV S prime:     11.90 cm/s TAPSE (M-mode): 2.5 cm RVSP:           55.1 mmHg LEFT ATRIUM             Index        RIGHT ATRIUM           Index LA diam:        2.20 cm 1.24 cm/m   RA Pressure: 3.00 mmHg LA Vol (A2C):   70.7 ml 39.76 ml/m  RA Area:     13.10 cm LA Vol (A4C):   21.1 ml 11.87 ml/m  RA Volume:   24.90 ml  14.00 ml/m LA Biplane Vol: 39.2 ml 22.05 ml/m  AORTIC VALVE                     PULMONIC VALVE AV Area (Vmax):    1.84 cm      PV Vmax:       1.24 m/s AV Area (Vmean):   1.97 cm      PV Peak grad:  6.2 mmHg AV Area (VTI):     1.82 cm AV Vmax:           229.00 cm/s AV Vmean:  141.000 cm/s AV VTI:            0.417 m AV Peak Grad:      21.0 mmHg AV Mean Grad:      9.3 mmHg LVOT Vmax:         166.00 cm/s LVOT Vmean:        109.000 cm/s LVOT VTI:          0.299 m LVOT/AV VTI ratio: 0.72  AORTA Ao Root diam: 3.00 cm Ao Asc diam:  3.30 cm MITRAL VALVE                TRICUSPID VALVE MV Area (PHT): 4.24 cm     TR Peak grad:   52.1 mmHg MV Area VTI:   2.03 cm     TR Vmax:        361.00 cm/s MV Peak grad:  7.7 mmHg     Estimated RAP:  3.00 mmHg MV Mean grad:  3.0 mmHg     RVSP:           55.1 mmHg MV Vmax:       1.39 m/s MV Vmean:      73.1 cm/s    SHUNTS MV Decel Time: 179 msec     Systemic VTI:  0.30 m MR Peak grad: 94.5 mmHg     Systemic Diam: 1.80 cm MR Vmax:      486.00 cm/s MV E velocity: 107.00 cm/s MV A velocity: 106.00 cm/s MV E/A ratio:  1.01 Kardie Tobb DO Electronically signed by Dub Huntsman DO Signature Date/Time: 08/27/2024/11:41:15 AM    Final    DG Chest Portable 1 View Result Date: 08/26/2024 EXAM: 1 VIEW(S) XRAY OF THE CHEST 08/26/2024 08:00:00 PM COMPARISON: CTA chest and portable chest both 08/22/2024. CLINICAL HISTORY: cough, hypoxia. Cough/hypoxia- Pt BIB ems for back pain, nausea, and SOB. Pt was diagnosed with a spinal fracture on Saturday, states they didn't do anything about it,  she is still in pain 10/10. States she is having a hard time catching her breath ever since the accident. Hx of CHF, COPD, asthma, MI, HTN, right lung cancer, former smoker. FINDINGS: LUNGS AND PLEURA: There is central vascular prominence and mild interstitial edema in the lung bases with small pleural effusions. Stable appearance of hazy opacities in both lung bases and could indicate atelectasis, ground-glass edema, or pneumonitis. Remaining lungs are clear with mild emphysematous change. No pneumothorax. HEART AND MEDIASTINUM: Stable Cardiomegaly. There is a stable mediastinum with aortic tortuosity and atherosclerosis. BONES AND SOFT TISSUES: Osteopenia and asymmetric advanced arthrosis of the right shoulder. The recent study demonstrated a mild to moderate T7 anterior wedge compression fracture new from 03/16/2024 chest CT, but this is not well reassessed AP. IMPRESSION: 1. Stable cardiomegaly with central vascular prominence and mild interstitial edema in the lung bases with small pleural effusions. 2. Stable hazy bibasilar opacities that may represent atelectasis, edema, or pneumonitis. Overall aeration seems unchanged. 3. Mild to moderate T7 anterior wedge compression fracture, new from 03/16/2024 chest CT noted on recent CTA chest, not well reassessed AP. Electronically signed by: Francis Quam MD 08/26/2024 08:11 PM EDT RP Workstation: [id number]    I spent 80 minutes involved in face-to-face and non-face-to-face activities for this patient on the day of the visit. Professional time spent includes the following activities: Preparing to see the patient (review of tests), Obtaining and reviewing separately obtained history (H and P, PCCM progress note, last discharge note), Performing a medically appropriate examination and evaluation ,  Ordering medications/labs, referring and communicating with other health care professionals, Documenting clinical information in the EMR, Independently interpreting  results (not separately reported), Communicating results to the patient,and Care coordination (not separately reported).  Electronically signed by:   Plan d/w requesting provider as well as ID pharm D  Of note, portions of this note may have been created with voice recognition software. While this note has been edited for accuracy, occasional wrong-word or 'sound-a-like' substitutions may have occurred due to the inherent limitations of voice recognition software.   Annalee Orem, MD Infectious Disease Physician [facility] Pager: [phone]

## 2024-09-23 NOTE — Progress Notes (Signed)
 eLink Physician-Brief Progress Note [name] DOB: 1941-01-10 MRN: [id number]   Date of Service  09/23/2024  HPI/Events of Note  Daughter notified bedside RN that her mother would like to transition to comfort measures. I entered the room via camera and, in the presence of both children and the bedside RN, verified with the patient that she wanted to discontinue CRRT, BIPAP and transition to full comfort measures leading to death if it happens, the process was explained in detail to the patient and her children and she acknowledged full understanding and confirmed her decision. I also verified the implied DNI / DNR order which was confirmed as well.  eICU Interventions  DNI / DNR and full comfort measures orders entered.        Tyresa Prindiville U Dyani Babel 09/23/2024, 9:50 PM

## 2024-09-23 NOTE — Plan of Care (Signed)
  Problem: Education: Goal: Knowledge of General Education information will improve Description: Including pain rating scale, medication(s)/side effects and non-pharmacologic comfort measures Outcome: Not Progressing   Problem: Health Behavior/Discharge Planning: Goal: Ability to manage health-related needs will improve Outcome: Not Progressing   Problem: Clinical Measurements: Goal: Ability to maintain clinical measurements within normal limits will improve Outcome: Not Progressing Goal: Will remain free from infection Outcome: Not Progressing Goal: Diagnostic test results will improve Outcome: Not Progressing Goal: Respiratory complications will improve Outcome: Not Progressing Goal: Cardiovascular complication will be avoided Outcome: Not Progressing   Problem: Activity: Goal: Risk for activity intolerance will decrease Outcome: Not Progressing   Problem: Nutrition: Goal: Adequate nutrition will be maintained Outcome: Not Progressing   Problem: Coping: Goal: Level of anxiety will decrease Outcome: Not Progressing   Problem: Elimination: Goal: Will not experience complications related to bowel motility Outcome: Not Progressing Goal: Will not experience complications related to urinary retention Outcome: Not Progressing   Problem: Pain Managment: Goal: General experience of comfort will improve and/or be controlled Outcome: Not Progressing   Problem: Safety: Goal: Ability to remain free from injury will improve Outcome: Not Progressing   Problem: Skin Integrity: Goal: Risk for impaired skin integrity will decrease Outcome: Not Progressing   Problem: Activity: Goal: Ability to tolerate increased activity will improve Outcome: Not Progressing   Problem: Respiratory: Goal: Ability to maintain a clear airway and adequate ventilation will improve Outcome: Not Progressing   Problem: Role Relationship: Goal: Method of communication will improve Outcome: Not  Progressing   Problem: Education: Goal: Ability to describe self-care measures that may prevent or decrease complications (Diabetes Survival Skills Education) will improve Outcome: Not Progressing Goal: Individualized Educational Video(s) Outcome: Not Progressing   Problem: Coping: Goal: Ability to adjust to condition or change in health will improve Outcome: Not Progressing   Problem: Fluid Volume: Goal: Ability to maintain a balanced intake and output will improve Outcome: Not Progressing   Problem: Health Behavior/Discharge Planning: Goal: Ability to identify and utilize available resources and services will improve Outcome: Not Progressing Goal: Ability to manage health-related needs will improve Outcome: Not Progressing   Problem: Metabolic: Goal: Ability to maintain appropriate glucose levels will improve Outcome: Not Progressing   Problem: Nutritional: Goal: Maintenance of adequate nutrition will improve Outcome: Not Progressing Goal: Progress toward achieving an optimal weight will improve Outcome: Not Progressing   Problem: Skin Integrity: Goal: Risk for impaired skin integrity will decrease Outcome: Not Progressing   Problem: Tissue Perfusion: Goal: Adequacy of tissue perfusion will improve Outcome: Not Progressing

## 2024-09-23 NOTE — Procedures (Signed)
 I have reviewed the CRRT procedure and made adjustments as needed.  Myer Fret MD  [facility] 09/23/2024, 5:08 PM

## 2024-09-23 NOTE — Progress Notes (Signed)
 Brief PCCM progress note  Attempted to remove BiPAP mask this afternoon and patient quickly required up titration of the oxygen  and began expectorating foamy white secretion with reoccurrence of A-fib RVR.  Concerned that patient is becoming more volume overloaded with worsening pleural edema, this was confirmed on chest x-ray.  Therefore goals of care discussion was held with patient and family after lengthy discussion decision was made to proceed with dialysis and attempted to assist with volume removal.  Patient understands risk and benefits and agrees to proceed.  Nephrology consulted  Wretha Laris D. Harris, NP-C [facility] Personal contact information can be found on Amion  If no contact or response made please call [phone] 09/23/2024, 4:36 PM

## 2024-09-23 NOTE — Progress Notes (Addendum)
 NAME:  Tammy Ball, MRN:  [id number], DOB:  [birth year], LOS: 8 ADMISSION DATE:  09/15/2024, CONSULTATION DATE: 09/23/24 REFERRING MD:  MELODIE, CHIEF COMPLAINT:  shock, respiratory failure   History of Present Illness:  Patient is a 83 yo F w/ pertinent PMH lung cancer s/p lobectomy and chemo, reneal cell carcinoma w/ left nephrectomy, diastolic chf, COPD on 2L [place] at home, thoracic spinal fracture involving T7, htn, hld, cad presents to [facility] on 11/4 w/ left sided chest pain.   Patient admitted last month for back pain. Imaging noted T7 compression spinal fracture. While in hospital patient developed worsening respiratory failure thought 2/2 to hypoventilation/atelectasis from severe pain requiring bipap. Completed abx for possible pna. IR performed kyphoplasty on 10/21 w/ improvement. Discharged to SNF for ongoing therapy.   On 11/3 patient developed cough w/ blood tinged sputum. On 11/4 patient woke up w/ acute left sided chest/back pain w/ sob. States she felt like she had fever and bodyaches. O2 had to be increased from 2 to 4L at facility. Brought to [facility] ED. On arrival patient temp 99.3 F and wbc 10. LA 1.9. BS w/ rhonchi/rales. BP soft. CXR w/ increased left basilar opacities and small left pleural effusion. Cultures obtained, given fluids, and started on rocephin /azithro. UA pending. D-dimer elevated 11.66 and trop 93. CTA chest showing multiple b/l segmental PE w/ evidence of R heart strain; RLL pleural based opacification; moderate consolidation over LLL w/ small left pleural effusion. Heparin  started. Despite iv fluids patient remained hypotensive requiring levo. Pccm consulted.  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   CHF (congestive heart failure) (HCC)    Claustrophobia    COPD (chronic obstructive pulmonary disease) (HCC)    Depression     Encounter for antineoplastic chemotherapy 02/09/2016   GERD (gastroesophageal reflux disease)    Headache    prior to menopause   Heart murmur    History of echocardiogram    Echo 2/17: EF 60-65%, grade 1 diastolic dysfunction, mild MR, trivial pericardial effusion   History of nuclear stress test    Myoview 2/17: no ischemia or scar, EF 79%; low risk   History of radiation therapy 10/30/16-11/06/16   Hyperlipidemia    Hypertension    Insomnia 05/16/2016   Myocardial infarction (HCC)    Non-small cell carcinoma of right lung, stage 2 (HCC) 10/03/2015   Oxygen  deficiency    Pneumonia    Radiation 02/29/16-04/10/16   50.4 Gy to right central chest   Renal cell carcinoma (HCC)    L nephrectomy  in 2012    Significant Hospital Events: Including procedures, antibiotic start and stop dates in addition to other pertinent events   11/4 admit w/ pna and pe on pressors 11/4 intubated, central venous access, on pressors-Levophed, on epoprostenol, LVEF 70-75% no RV abnormality  11/5 art line placed  11/6 oozig from art line, labile blood pressures and HR. C/f hemorrhagic shock w hgb 7.3 getting 2 PRBC  11/8 Extubated, Afib RVR briefly on amiodarone.  chest pain, started argatroban while heparin  is on hold, pending HIT antibodies 11/9 a. Fib with RVR restarted amiodarone, remained on vasopressors overnight, became acutely hypercapnic, placed on BiPAP with improvement in mental status. 11/10 back on bipap and pressors. More acidotic overnight tachycardic w/ AF RVR. Off dex 11/11 off bipap  all night then hard to arouse in am. Got hypotensive again so back om low dose NE 2 mcg/min. Left: Resting left ankle-brachial index indicates mild left lower  extremity arterial disease. The left toe-brachial index is abnormal.  11/12 On BIPAP this am, alert and interactive, remains on lasix  drip but renal function is worse with little to no urine output./ Remains +12L with BNP greater than 35,000  Interim  History / Subjective:  Alert and interactive on BIPAP   Objective    Blood pressure (!) 162/79, pulse 63, temperature 99.1 F (37.3 C), resp. rate 20, height 5' 3 (1.6 m), weight 64.8 kg, SpO2 98%.    FiO2 (%):  [30 %-40 %] 30 %   Intake/Output Summary (Last 24 hours) at 09/23/2024 9266 Last data filed at 09/23/2024 9340 Gross per 24 hour  Intake 891.24 ml  Output 290 ml  Net 601.24 ml    Filed Weights   09/18/24 0500 09/19/24 0704 09/22/24 0500  Weight: 63.3 kg 66.8 kg 64.8 kg    Examination: General: Acute on chronically ill appearing deconditioned female lying in bed on BIPAP, in NAD HEENT: BIPAP in place, MM pink/moist, PERRL,  Neuro: Alert and interactive on vent difficult to assess mentation  CV: s1s2 regular rate and rhythm, no murmur, rubs, or gallops,  PULM:  Tolerating BIPAP, no increased work of breathing, diminished breath sounds bilaterally  GI: soft, bowel sounds active in all 4 quadrants, non-tender, non-distended Extremities: warm/dry, 2+ upper extremity and ABD edema  Skin: no rashes or lesions  Resolved problem list   Assessment and Plan   Septic Shock due to  A. Baumannii bacteremia  -was off norepi gtt on 11/9, started to wean stress dose steroids on 8th and discontinued on 9th.  Back on pressors as of 11/10.  Think acidosis likely also contributes to her pressor needs when she hypoventilates.  P: Off vasopressors again am 11/12 ID following, appreciate assistance  Antibiotics as above    Acinetobacter Baumannii Bacteremia: - S/p tobramycin  and Vancomycin  - Verbal consult with ID 11/11 P: Continue both Unasyn and Minocycline x 14 days, consider stopping Minocycline when sensitives resulted   Low Risk Pulmonary Embolism P: Remains on Heparin  will discuss with attending timing to change to DOAC   Atrial Fibrillation with RVR: rate better controlled P: Heparin  as above  Continue amio drip, will try and transition to PO soon  Continuous  telemetry   Acute Hypoxic and Hypercapnic Respiratory Failure: likely due to PNA, pulmonary edema and COPD exacerbation. -Seems like deconditioning and hypoventilation also large contributing factors as clinically became more hypercarbic after being off bipap all night  P: Continue supplemental oxygen  as needed  BIPAP at night and as needed  Scheduled Bds  Continue Steroids x5 days  Daily assessment of need to diurese  CXR as needed   AKI: suspect component of cardiorenal etiology; still appears as though total body overload   Renal cell carcinoma w/ left nephrectomy -Other etiology is due to aminoglycoside administration. -Scr bumped a little w/ diuresis  P: Lasix  and albumin  trailed 11/11 with decision made per overnight coverage to start lasix  drip  Am 11/12 no improvement in output with decline in renal function Unfortunately remains +12L with BNP Greater than 35, 000 but clinically she appears intravascularly deplete will schedule Albumin  today and stop lasix  drip  Follow renal function  Monitor urine output Trend Bmet Avoid nephrotoxins Ensure adequate renal perfusion   H/o reflux P: PPI  Thrombocytopenia 2/2 sepsis -  improved  P: Trend CBC Transfuse per protocol as needed   Acute Metabolic Encephalopathy -Likely due to CO2 narcosis. Improved today. P: Maintain neuro protective measures Nutrition and bowel regimen Aspirations precautions  Minimize sedation   PAD -Left ankle-brachial index indicates mild left lower  extremity arterial disease. The left toe-brachial index is abnormal.  P: Continue Heparin    Nutrition P: Encourage PO intake as able  NPO while on BIPAP   Goals of care  Will engage with patient and family today to determine best plan of care moving forward given significant deconditioning and worsening renal function    Critical care   CRITICAL CARE Performed by: Ronella Plunk D. Harris   Total critical care time: 38 minutes  Critical care  time was exclusive of separately billable procedures and treating other patients.  Critical care was necessary to treat or prevent imminent or life-threatening deterioration.  Critical care was time spent personally by me on the following activities: development of treatment plan with patient and/or surrogate as well as nursing, discussions with consultants, evaluation of patient's response to treatment, examination of patient, obtaining history from patient or surrogate, ordering and performing treatments and interventions, ordering and review of laboratory studies, ordering and review of radiographic studies, pulse oximetry and re-evaluation of patient's condition.  Rosalie Gelpi D. Harris, NP-C [facility] Personal contact information can be found on Amion  If no contact or response made please call [phone] 09/23/2024, 8:49 AM

## 2024-09-23 NOTE — Progress Notes (Signed)
   09/23/24 2021  BiPAP/CPAP/SIPAP  BiPAP/CPAP/SIPAP Pt Type Adult  BiPAP/CPAP/SIPAP V60  Mask Type Full face mask  Dentures removed? Not applicable  Mask Size Medium  Set Rate 20 breaths/min  Respiratory Rate 21 breaths/min  IPAP 14 cmH20  EPAP 7 cmH2O  FiO2 (%) 40 %  Flow Rate 0.9 lpm  Minute Ventilation 8.1  Leak 27  Peak Inspiratory Pressure (PIP) 15  Tidal Volume (Vt) 408  Patient Home Machine No  Patient Home Mask No  Patient Home Tubing No  Auto Titrate No  Press High Alarm 30 cmH2O  Press Low Alarm 5 cmH2O  Device Plugged into RED Power Outlet Yes

## 2024-09-23 NOTE — Progress Notes (Signed)
 Patient's CRRT tx was stopped at 22:34, able to return blood, took her off BIPAP, started Dilaudid  gtt for comfort measures.   Patient is alert and able to converse that she is comfortable at this time. Son & daughter are at bedside and RN talked to them about 3 wishes and they request the fingerprint keychain.

## 2024-09-23 NOTE — Procedures (Signed)
 Central Venous Catheter Insertion Procedure Note  Tammy Ball  [id number]  1941/04/28  Date:09/23/24  Time:4:25 PM   Provider Performing:Criselda Starke D. Harris   Procedure: Insertion of Non-tunneled Central Venous Catheter(36556)with US  guidance (23062)     Indication(s) Hemodialysis  Consent Risks of the procedure as well as the alternatives and risks of each were explained to the patient and/or caregiver.  Consent for the procedure was obtained and is signed in the bedside chart  Anesthesia Topical only with 1% lidocaine    Timeout Verified patient identification, verified procedure, site/side was marked, verified correct patient position, special equipment/implants available, medications/allergies/relevant history reviewed, required imaging and test results available.  Sterile Technique Maximal sterile technique including full sterile barrier drape, hand hygiene, sterile gown, sterile gloves, mask, hair covering, sterile ultrasound probe cover (if used).  Procedure Description Area of catheter insertion was cleaned with chlorhexidine  and draped in sterile fashion.   With real-time ultrasound guidance a HD catheter was placed into the right internal jugular vein.  Nonpulsatile blood flow and easy flushing noted in all ports.  The catheter was sutured in place and sterile dressing applied.  Complications/Tolerance None; patient tolerated the procedure well. Chest X-ray is ordered to verify placement for internal jugular or subclavian cannulation.  Chest x-ray is not ordered for femoral cannulation.  EBL Minimal  Specimen(s) None  Majid Mccravy D. Harris, NP-C [facility] Personal contact information can be found on Amion  If no contact or response made please call [phone] 09/23/2024, 4:26 PM

## 2024-09-23 NOTE — Progress Notes (Addendum)
 eLink Physician-Brief Progress Note [name] DOB: 1941-04-24 MRN: [id number]   Date of Service  09/23/2024  HPI/Events of Note  Patient with oliguria in the face of 12 liters positive fluid balance.  eICU Interventions  BNP to try to gauge current overall volume status (baseline is 1,367 six days ago), if BNP dramatically increased can consider low dose Lasix  gtt as a way to try to gently stimulate increased urine output without exacerbating kidney injury.        Hazle Ogburn U Esther Broyles 09/23/2024, 3:10 AM

## 2024-09-23 NOTE — Progress Notes (Signed)
 PHARMACY - ANTICOAGULATION CONSULT NOTE  Pharmacy Consult for Heparin  Indication: pulmonary embolus + afib  Allergies  Allergen Reactions   Bee Venom Anaphylaxis, Shortness Of Breath, Swelling and Other (See Comments)    Swelling at site (reaction to bees and wasps)   Amlodipine  Swelling and Other (See Comments)    Swelling of the ankles and hands    Levofloxacin Other (See Comments)    Joint pain    Alendronate  Other (See Comments)    Joint pains / hypercalcemia    Hctz [Hydrochlorothiazide ] Palpitations and Other (See Comments)    Sweating     Patient Measurements: Height: 5' 3 (160 cm) Weight: 82.8 kg (182 lb 8.7 oz) IBW/kg (Calculated) : 52.4 HEPARIN  DW (KG): 67.1  Vital Signs: Temp: 99 F (37.2 C) (11/12 0900) BP: 181/83 (11/12 0918) Pulse Rate: 80 (11/12 0918)  Labs: Recent Labs    09/21/24 0500 09/21/24 1403 09/22/24 0500 09/22/24 0927 09/22/24 2053 09/23/24 0340 09/23/24 0742  HGB 9.3*  --  10.3*  --   --  8.9*  --   HCT 28.8*  --  32.9*  --   --  27.7*  --   PLT 107*  --  146*  --   --  130*  --   HEPARINUNFRC 0.62  --  1.06* 1.09* 0.42  --  0.52  CREATININE 2.04* 2.05* 2.51*  --   --  3.12*  --     Estimated Creatinine Clearance: 14.4 mL/min (A) (by C-G formula based on SCr of 3.12 mg/dL (H)).   AC/Heme: UFH for B PE w/ R heart strain, ddimer 11.66. +Afib - 11/6 Heparin  held for anemia, bleeding from Aline R groin. CT angio GIB w c/f possible pseudoaneurysm and some bleeding from arterial access. D/c Aline - 11/7: Hgb 10 down, Plts 68 down. Groin hematoma improved per MD. Resume Heparin  11/7 HIT antibody: WNL  09/23/2024 Heparin  level = 0.52 - therapeutic on heparin  drip @ 550 units/hr> heparin  level drawn via peripheral stick  Hg 8.9- fairly stable;  PLT 130 (trough 68) No bleeding reported SCr up to 3.12, oliguria  Goal of Therapy:  Heparin  level 0.3-0.7 units/ml Monitor platelets by anticoagulation protocol: Yes   Plan:  Continue  heparin  drip at 550 units/hr Heparin  level daily with peripheral stick while heparin  infusing on same side as central line (left) Daily heparin  level and CBC F/u transition to DOAC when able to take PO meds & no need for invasive procedures   Tammy Ball, Pharm.D Use secure chat for questions 09/23/2024 9:43 AM

## 2024-09-25 LAB — CULTURE, BLOOD (ROUTINE X 2)

## 2024-10-12 NOTE — Death Summary Note (Signed)
 DEATH SUMMARY   Patient Details  Name: Tammy Ball MRN: [id number] DOB: 1941/08/18  Admission/Discharge Information   Admit Date:  09/23/2024  Date of Death: Date of Death: Oct 02, 2024  Time of Death: Time of Death: 0132  Length of Stay: 09-28-2024  Referring Physician: Purcell [name], MD   Reason(s) for Hospitalization  Low Risk PE and Community Acquired Pneumonia  Diagnoses  Preliminary cause of death:  Secondary Diagnoses (including complications and co-morbidities):  Principal Problem:   Septic shock [facility])   Brief Hospital Course (including significant findings, care, treatment, and services provided and events leading to death)  Tammy Ball is a 83 y.o. year old female w/ pertinent PMH lung cancer s/p lobectomy and cehmo, reneal cell carcinoma w/ left nephrectomy, diastolic chf, COPD on 2L [place] at home, thoracic spinal fracture involving T7, htn, hld, cad presents to [facility] on 2024/09/23 w/ left sided chest pain.   Patient admitted last month for back. Imaging noted T7 compression spinal fracture. While in hospital patient developed worsening respiratory failure thought 2/2 to hypoventilation/atelectasis from severe pain requiring bipap. Completed abx for possible pna. IR performed kyphoplasty on 10/21 w/ improvement. Discharged to SNF for ongoing therapy.   On 11/3 patient developed cough w/ blood tinged sputum. On Sep 23, 2024 patient woke up w/ acute left sided chest/back pain w/ sob. States she felt like she had fever and bodyaches. O2 had to be increased from 2 to 4L at facility. Brought to [facility] ED. On arrival patient temp 99.3 F and wbc 10. LA 1.9. BS w/ rhonchi/rales. BP soft. CXR w/ increased left basilar opacities and small left pleural effusion. Cultures obtained, given fluids, and started on rocephin /azithro. UA pending. D-dimer elevated 11.66 and trop 93. CTA chest showing multiple b/l segmental PE w/ evidence of R heart strain; RLL pleural based opacification; moderate consolidation over  LLL w/ small left pleural effusion. Heparin  started. Despite iv fluids patient remained hypotensive requiring levo. Pccm consulted.  Significant Events: Sep 23, 2024 admit w/ pna and pe on pressors 09/23/2024 intubated, central venous access, on pressors-Levophed, on epoprostenol, 11/5 art line 11/6 oozig from art line, labile blood pressures and HR. C/f hemorrhagic shock w hgb 7.3 getting 2 PRBC  11/8 Extubated, Afib RVR briefly on amiodarone.  chest pain, started argatroban while heparin  is on hold, pending HIT antibodies 09/28/2024 a. Fib with RVR restarted amiodarone, remained on vasopressors overnight, became acutely hypercapnic, placed on BiPAP with improvement in mental status. 11/10 back on bipap and pressors. More acidotic overnight tachycardic w/ AF RVR. Off dex 11/11 off bipap all night then hard to arouse in am. Got hypotensive again so back om low dose NE 2 mcg/min. Left: Resting left ankle-brachial index indicates mild left lower extremity arterial disease. The left toe-brachial index is abnormal.  11/12 On BIPAP this am, alert and interactive, remains on lasix  drip but renal function is worse with little to no urine output./ Remains +12L with BNP greater than 35,000. Discussed GOC and made DNR. Patient interested in pursuing dialysis. Hemodialysis catheter inserted and CRRT was started. However, patient's daughter contacted E link doctor and noted that patient changed her mind and did not want to pursue dialysis anymore. See Dr. Edrick note for details. The patient was transitioned to comfort measures only. 10-02-24 --> TOD 1:32 AM.  Pertinent Labs and Studies  Significant Diagnostic Studies DG CHEST PORT 1 VIEW Result Date: 09/23/2024 CLINICAL DATA:  Central line placement. EXAM: PORTABLE CHEST 1 VIEW COMPARISON:  Chest radiograph dated 09/23/2024. FINDINGS:  Evaluation is limited due to overlying support wires. Bilateral IJ central venous lines with tip over central SVC. No pneumothorax. No significant  interval change in bilateral pleural effusions and bibasilar atelectasis or infiltrate. Stable cardiac silhouette no acute osseous pathology. IMPRESSION: Placement of a right IJ central venous line with tip close to the cavoatrial junction. No pneumothorax. Electronically Signed   By: Vanetta Chou M.D.   On: 09/23/2024 17:25   DG Chest Port 1 View Result Date: 09/23/2024 CLINICAL DATA:  Pneumonia. EXAM: PORTABLE CHEST 1 VIEW COMPARISON:  09/19/2024 FINDINGS: The cardio pericardial silhouette is enlarged. Persistent bibasilar collapse/consolidation with small bilateral pleural effusions, similar to prior. Left IJ central line tip overlies the mid SVC level. Bones are diffusely demineralized. Telemetry leads overlie the chest. IMPRESSION: Persistent bibasilar collapse/consolidation with small bilateral pleural effusions. Electronically Signed   By: Camellia Candle M.D.   On: 09/23/2024 05:42   VAS US  ABI WITH/WO TBI Result Date: 09/21/2024  LOWER EXTREMITY DOPPLER STUDY Patient Name:  Tammy Ball  Date of Exam:   09/21/2024 Medical Rec #: [id number]       Accession #:    [id number] Date of Birth: 05-Mar-1941       Patient Gender: F Patient Age:   83 years Exam Location:  [facility] Procedure:      VAS US  ABI WITH/WO TBI Referring Phys: SAMMI FREDERICKS --------------------------------------------------------------------------------  Indications: Mottling of feet. High Risk         Hypertension, hyperlipidemia, prior MI, coronary artery Factors:          disease. Other Factors: Atrial fibrillation with RVR. Patient on Amiodorone and pressors.                Acute Hypoxic and Hypercapnic Respiratory Failure: likely due to                pulmonary edema and COPD exacerbation.  Comparison Study: No prior ABI on file Performing Technologist: Rachel Pellet RVS  Examination Guidelines: A complete evaluation includes at minimum, Doppler waveform signals and systolic blood pressure reading at the level of  bilateral brachial, anterior tibial, and posterior tibial arteries, when vessel segments are accessible. Bilateral testing is considered an integral part of a complete examination. Photoelectric Plethysmograph (PPG) waveforms and toe systolic pressure readings are included as required and additional duplex testing as needed. Limited examinations for reoccurring indications may be performed as noted.  ABI Findings: +---------+------------------+-----+---------+--------+ Right    Rt Pressure (mmHg)IndexWaveform Comment  +---------+------------------+-----+---------+--------+ Brachial 153                    triphasic         +---------+------------------+-----+---------+--------+ PTA      150               0.98 biphasic          +---------+------------------+-----+---------+--------+ DP       151               0.99 biphasic          +---------+------------------+-----+---------+--------+ Great Toe0                 0.00 Absent            +---------+------------------+-----+---------+--------+ +---------+------------------+-----+---------+-------+ Left     Lt Pressure (mmHg)IndexWaveform Comment +---------+------------------+-----+---------+-------+ Brachial 148                    triphasic        +---------+------------------+-----+---------+-------+  PTA      144               0.94 biphasic         +---------+------------------+-----+---------+-------+ DP       134               0.88 biphasic         +---------+------------------+-----+---------+-------+ Great Toe73                0.48 Abnormal         +---------+------------------+-----+---------+-------+ +-------+-----------+-----------+------------+------------+ ABI/TBIToday's ABIToday's TBIPrevious ABIPrevious TBI +-------+-----------+-----------+------------+------------+ Right  0.99       absent                              +-------+-----------+-----------+------------+------------+ Left    0.94       0.48                                +-------+-----------+-----------+------------+------------+  Summary: Right: Resting right ankle-brachial index is within normal range.  Absent great toe and second toe waveform/pressure. Left: Resting left ankle-brachial index indicates mild left lower extremity arterial disease. The left toe-brachial index is abnormal.  *See table(s) above for measurements and observations.  Electronically signed by Fonda Rim on 09/21/2024 at 3:14:55 PM.    Final    DG CHEST PORT 1 VIEW Result Date: 09/19/2024 EXAM: 1 VIEW(S) XRAY OF THE CHEST 09/19/2024 02:09:00 PM COMPARISON: 09/15/2024 CLINICAL HISTORY: Chest pain FINDINGS: LINES, TUBES AND DEVICES: Left internal jugular central venous catheter in place with tip overlying the expected region of the distal superior vena cava. LUNGS AND PLEURA: Left base airspace opacity. Increased interstitial markings. Small left pleural effusion. Trace right pleural effusion. Additional right basilar opacities and volume loss suggestive of atelectasis. No pulmonary edema. No pneumothorax. HEART AND MEDIASTINUM: Mild cardiomegaly. Atherosclerotic plaque noted. BONES AND SOFT TISSUES: Cervical spine surgical hardware noted. Midthoracic spine kyphoplasty noted. IMPRESSION: 1. Small left pleural effusion. Left basilar opacities likely reflecting atelectasis. 2. Additional right basilar opacities are increased, also likely reflecting atelectasis. 3. Possible trace right pleural effusion. Electronically signed by: Donnice Mania MD 09/19/2024 06:52 PM EST RP Workstation: [id number]   CT Angio Chest Pulmonary Embolism (PE) W or WO Contrast Result Date: 09/19/2024 CLINICAL DATA:  Chest pain with known pulmonary embolism. Clinical concern for HIT. EXAM: CT ANGIOGRAPHY CHEST WITH CONTRAST TECHNIQUE: Multidetector CT imaging of the chest was performed using the standard protocol during bolus administration of intravenous contrast. Multiplanar CT  image reconstructions and MIPs were obtained to evaluate the vascular anatomy. RADIATION DOSE REDUCTION: This exam was performed according to the departmental dose-optimization program which includes automated exposure control, adjustment of the mA and/or kV according to patient size and/or use of iterative reconstruction technique. CONTRAST:  75mL OMNIPAQUE  IOHEXOL  350 MG/ML SOLN COMPARISON:  09/15/2024. FINDINGS: Cardiovascular: The heart is enlarged and there is a trace pericardial effusion. Scattered coronary artery calcifications are present. There is atherosclerotic calcification of the aorta without evidence of aneurysm. The pulmonary trunk is distended suggesting underlying pulmonary artery hypertension. Small segmental and subsegmental pulmonary emboli are present in the right upper lower, and middle lobes with decreased thrombus burden. Previously described left pulmonary emboli are not seen on this exam. There is no evidence of right heart strain. Mediastinum/Nodes: No mediastinal, hilar, or axillary lymphadenopathy is seen. The thyroid  gland, trachea, and esophagus are within  normal limits. Lungs/Pleura: There small bilateral pleural effusions, greater on the left than on the right. The left basilar consolidation is noted. Scattered airspace opacities are seen in the lungs bilaterally, possible edema or infiltrate. Emphysematous changes are present in the lungs. No pneumothorax is seen. Upper Abdomen: Hyperdense material is noted in the gallbladder, possible stones or sludge. The left kidney is not seen. Anasarca is noted. Musculoskeletal: Cervical spinal fusion hardware is present. Degenerative changes are noted in the thoracic spine. There are compression deformities in the thoracic spine with kyphoplasty changes. No acute osseous abnormality is seen. Review of the MIP images confirms the above findings. IMPRESSION: 1. Right-sided pulmonary emboli, decreased in volume. No obvious pulmonary emboli in  the left lung. No evidence of right heart strain. 2. Left lower lobe consolidation, increased from the prior exam. 3. Scattered airspace opacities in the lungs bilaterally, possible edema or infiltrate. 4. Small bilateral pleural effusions. 5. Emphysema. 6. Aortic atherosclerosis and coronary artery calcifications. 7. Mild anasarca. Electronically Signed   By: Leita Birmingham M.D.   On: 09/19/2024 17:08   DG Abd 1 View Result Date: 09/18/2024 EXAM: 1 VIEW XRAY OF THE ABDOMEN 09/18/2024 07:08:43 PM COMPARISON: Abdominal x-ray 05/15/2024, CT GI bleed 09/17/2024. CLINICAL HISTORY: Abdominal pain. FINDINGS: LINES, TUBES AND DEVICES: Enteric tube tip is in the body of the stomach. BOWEL: Nonobstructive bowel gas pattern. No dilated bowel loops are identified. SOFT TISSUES: No opaque urinary calculi. BONES: No acute osseous abnormality. IMPRESSION: 1. No bowel obstruction. Electronically signed by: Greig Pique MD 09/18/2024 11:12 PM EST RP Workstation: [id number]   VAS US  GROIN PSEUDOANEURYSM Result Date: 09/18/2024  ARTERIAL PSEUDOANEURYSM  Patient Name:  Tammy Ball  Date of Exam:   09/18/2024 Medical Rec #: [id number]       Accession #:    [id number] Date of Birth: [birth year]       Patient Gender: F Patient Age:   34 years Exam Location:  [facility] Procedure:      VAS US  QUILLIAN CAPES Referring Phys: SAMMI FREDERICKS --------------------------------------------------------------------------------  Exam: Right groin Indications: Patient complains of Bleeding from right femoral arterial line with drop in hemoglobin. CT suggest small femoral pseudoaneurysm. Limitations: Tissue properties. Body habitus. Comparison Study: No prior study on file Performing Technologist: Alberta Lis RVS  Examination Guidelines: A complete evaluation includes B-mode imaging, spectral Doppler, color Doppler, and power Doppler as needed of all accessible portions of each vessel. Bilateral testing is considered an integral  part of a complete examination. Limited examinations for reoccurring indications may be performed as noted.  Findings: An area with well defined borders measuring 1.6 cm x 2.1 cm was visualized arising off of the CFA with ultrasound characteristics of a partially thrombosed pseudoaneurysm.  Diagnosing physician: Debby Robertson Electronically signed by Debby Robertson on 09/18/2024 at 4:16:31 PM.   --------------------------------------------------------------------------------    Final    CT ANGIO AO+BIFEM W & OR WO CONTRAST Result Date: 09/17/2024 CLINICAL DATA:  Bleeding from right femoral arterial line site overnight with drop in hemoglobin and overlying ecchymosis. EXAM: CT ANGIOGRAPHY OF ABDOMINAL AORTA WITH ILIOFEMORAL RUNOFF TECHNIQUE: Multidetector CT imaging of the abdomen, pelvis and lower extremities was performed using the standard protocol during bolus administration of intravenous contrast. Multiplanar CT image reconstructions and MIPs were obtained to evaluate the vascular anatomy. RADIATION DOSE REDUCTION: This exam was performed according to the departmental dose-optimization program which includes automated exposure control, adjustment of the mA and/or kV according to patient size and/or use of iterative  reconstruction technique. CONTRAST:  OMNIPAQUE  IOHEXOL  350 MG/ML SOLN COMPARISON:  CTA abdomen and pelvis earlier today. FINDINGS: VASCULAR Aorta: Stable atherosclerosis without aneurysm.  Normally patent. Celiac: Normally patent. SMA: Normally patent. Renals: Normally patent single right renal artery. Left renal artery ligated after prior nephrectomy. IMA: Normally patent. RIGHT Lower Extremity Inflow: Patent iliac arteries with atherosclerosis. No aneurysm or significant obstructive disease. Outflow: As noted on the CTA pelvis, there is a probable small pseudoaneurysm along the inferior aspect of the femoral arterial insertion into the common femoral artery. This is better delineated and  described on the CTA abdomen pelvis report. No overt extravasation of contrast material other than the focal outpouching anterior to the common femoral artery. Normally patent SFA and profunda femoral arteries. Small but patent popliteal artery. Runoff: Continuously patent posterior tibial artery into the foot. Small peroneal artery to be followed to just above the ankle. Anterior tibial artery is patent into the foot. LEFT Lower Extremity Inflow: Patent iliac arteries with atherosclerosis. No aneurysm or significant obstructive disease. Outflow: Normally patent common femoral artery and femoral bifurcation. Normally patent SFA and profunda femoral arteries. Patent popliteal artery. Runoff: Continuously patent posterior tibial and anterior tibial arteries into the foot. The peroneal artery is small in caliber and is difficult to visualize beyond the distal calf. Review of the MIP images confirms the above findings. NON-VASCULAR See CTA abdomen pelvis. IMPRESSION: 1. Probable small pseudoaneurysm along the inferior aspect of the right femoral arterial insertion into the common femoral artery. This is better delineated and described on the CTA abdomen pelvis report. No overt extravasation of contrast material other than the focal outpouching anterior to the common femoral artery. 2. Atherosclerosis of the abdominal aorta and iliac arteries without aneurysm or significant obstructive disease. 3. Continuously patent posterior tibial and anterior tibial arteries into both feet. The peroneal arteries are small in caliber and are difficult to visualize beyond the distal calves. Electronically Signed   By: Marcey Moan M.D.   On: 09/17/2024 15:32   CT ANGIO GI BLEED Result Date: 09/17/2024 CLINICAL DATA:  Bleeding from right femoral arterial line site overnight with drop in hemoglobin and overlying ecchymosis. EXAM: CTA ABDOMEN AND PELVIS WITHOUT AND WITH CONTRAST TECHNIQUE: Multidetector CT imaging of the abdomen and  pelvis was performed using the standard protocol during bolus administration of intravenous contrast. Multiplanar reconstructed images and MIPs were obtained and reviewed to evaluate the vascular anatomy. RADIATION DOSE REDUCTION: This exam was performed according to the departmental dose-optimization program which includes automated exposure control, adjustment of the mA and/or kV according to patient size and/or use of iterative reconstruction technique. CONTRAST:  OMNIPAQUE  IOHEXOL  350 MG/ML SOLN COMPARISON:  CT abdomen pelvis 08/22/2024 FINDINGS: VASCULAR Aorta: Stable atherosclerosis without aneurysm. Normal patency. No dissection Celiac: Normally patent. Normally patent branch vessels and branching anatomy. SMA: Normally patent. Renals: Normally patent single right renal artery. Ligated left renal artery after prior left nephrectomy. IMA: Normally patent. Inflow: Iliac atherosclerosis with normal bilateral iliac arterial patency and no evidence significant obstructive disease or aneurysmal disease. Proximal Outflow: Small caliber arterial line enters the right common femoral artery at roughly the level of the inguinal ligament. On the venous phase of imaging, there is evidence of a probable small femoral pseudoaneurysm just inferior to the arterial line entry site measuring approximately 8 mm in diameter. There is associated hemorrhage and ecchymosis primarily superficial to the femoral artery but also extending into the right pelvic extraperitoneal space. No significant intra-abdominal retroperitoneal hemorrhage.  Veins: Normal patency of venous structures in the abdomen and pelvis. Review of the MIP images confirms the above findings. NON-VASCULAR Lower chest: Atelectasis/consolidation of the left lower lobe with associated small left pleural effusion. Hepatobiliary: Periportal edema in the liver. No focal lesion. There is some fluid around the gallbladder in the gallbladder fossa. No significant  gallbladder distension. No biliary ductal dilatation. Pancreas: Unremarkable. No pancreatic ductal dilatation or surrounding inflammatory changes. Spleen: Normal in size without focal abnormality. Adrenals/Urinary Tract: Unremarkable appearance of solitary right kidney. Status post left nephrectomy. No adrenal masses. The bladder is decompressed by a Foley catheter. Stomach/Bowel: Bowel shows no evidence of obstruction, ileus, inflammation or lesion. No active gastrointestinal bleeding. The appendix is not discretely visualized. No free intraperitoneal air. Lymphatic: No lymphadenopathy identified in the abdomen or pelvis. Reproductive: Uterus and bilateral adnexa are unremarkable. Other: Small amount of free fluid in the dependent pelvis. Small bilateral inguinal hernias containing fat. Musculoskeletal: No acute or significant osseous findings. IMPRESSION: 1. Small caliber arterial line enters the right common femoral artery at roughly the level of the inguinal ligament. On the venous phase of imaging, there is evidence of a probable small femoral pseudoaneurysm just inferior to the arterial line entry site measuring approximately 8 mm in diameter. This could be confirmed by arterial duplex ultrasound. 2. There is associated hemorrhage and ecchymosis primarily superficial to the femoral artery but also extending into the right pelvic extraperitoneal space. No significant intra-abdominal retroperitoneal hemorrhage. 3. Atelectasis/consolidation of the left lower lobe with associated small left pleural effusion. 4. Periportal edema in the liver. 5. Fluid surrounding the gallbladder in the gallbladder fossa without significant gallbladder distension. Cannot exclude acute cholecystitis. Clinical correlation suggested. Consider right upper quadrant ultrasound. 6. Small amount of free fluid in the dependent pelvis. 7. Small bilateral inguinal hernias containing fat. 8. Aortic atherosclerosis without aneurysm.  Electronically Signed   By: Marcey Moan M.D.   On: 09/17/2024 10:19   DG CHEST PORT 1 VIEW Result Date: 09/15/2024 EXAM: 1 VIEW(S) XRAY OF THE CHEST 09/15/2024 11:16:00 PM COMPARISON: 09/15/2024 CLINICAL HISTORY: Encounter for imaging study to confirm orogastric (OG) tube placement. FINDINGS: LINES, TUBES AND DEVICES: Tip of the vascular catheter overlies the cavoatrial region. Enteric tube tip below the diaphragm but incompletely visualized. LUNGS AND PLEURA: Lung apices are not included. Pleural effusions and airspace disease left base. No pulmonary edema. No pneumothorax. HEART AND MEDIASTINUM: No acute abnormality of the cardiac and mediastinal silhouettes. BONES AND SOFT TISSUES: No acute osseous abnormality. IMPRESSION: 1. Enteric tube tip below the diaphragm, incompletely visualized. 2. Vascular catheter tip at the cavoatrial region. 3. Left basilar pleural effusion and airspace disease. Electronically signed by: Luke Bun MD 09/15/2024 11:23 PM EST RP Workstation: [id number]   DG Abd 1 View Result Date: 09/15/2024 EXAM: 1 VIEW XRAY OF THE ABDOMEN 09/15/2024 08:18:00 PM COMPARISON: CT 08/22/2024 CLINICAL HISTORY: Encounter for orogastric (OG) tube placement. FINDINGS: LINES, TUBES AND DEVICES: Enteric tube tip projects over proximal stomach. Side port in the region of the GE junction. BOWEL: The visible gas pattern is unremarkable. SOFT TISSUES: No opaque urinary calculi. BONES: No acute osseous abnormality. IMPRESSION: 1. Enteric tube with tip in the proximal stomach; side port at the gastroesophageal junction. Consider advancing by 5 to 10 cm for more optimal positioning. 2. Nonobstructive bowel gas pattern. Electronically signed by: Luke Bun MD 09/15/2024 08:28 PM EST RP Workstation: [id number]   DG CHEST PORT 1 VIEW Result Date: 09/15/2024 EXAM: 1 VIEW(S) XRAY OF THE CHEST  09/15/2024 07:24:00 PM COMPARISON: 09/15/2024 CLINICAL HISTORY: Encounter for central line placement [id number];  Endotracheally intubated [id number]. FINDINGS: LINES, TUBES AND DEVICES: Endotracheal tube tip is 5 cm above the carina. NG tube enters the stomach. Left central line tip at the cavoatrial junction. LUNGS AND PLEURA: Layering left pleural effusion. Left lower lobe airspace disease concerning for pneumonia, similar to prior study. Right basilar atelectasis or infiltrate. No pulmonary edema. No pneumothorax. HEART AND MEDIASTINUM: No acute abnormality of the cardiac and mediastinal silhouettes. BONES AND SOFT TISSUES: No acute osseous abnormality. IMPRESSION: 1. Left lower lobe airspace disease concerning for pneumonia, similar to prior study. 2. Layering left pleural effusion. 3. Right basilar atelectasis or infiltrate. Electronically signed by: Franky Crease MD 09/15/2024 07:34 PM EST RP Workstation: [id number]   VAS US  LOWER EXTREMITY VENOUS (DVT) Result Date: 09/15/2024  Lower Venous DVT Study Patient Name:  Tammy Ball  Date of Exam:   09/15/2024 Medical Rec #: [id number]       Accession #:    [id number] Date of Birth: 05/09/1941       Patient Gender: F Patient Age:   49 years Exam Location:  [facility] Procedure:      VAS US  LOWER EXTREMITY VENOUS (DVT) Referring Phys: JOHN PAYNE --------------------------------------------------------------------------------  Indications: Pulmonary embolism. Other Indications: Lung cancer s/p lobectomy and cehmo, reneal cell carcinoma w/                    left nephrectomy, diastolic chf, COPD. Anticoagulation: Heparin . Comparison Study: No priors. Performing Technologist: Ricka Sturdivant-Jones RDMS, RVT  Examination Guidelines: A complete evaluation includes B-mode imaging, spectral Doppler, color Doppler, and power Doppler as needed of all accessible portions of each vessel. Bilateral testing is considered an integral part of a complete examination. Limited examinations for reoccurring indications may be performed as noted. The reflux portion of the exam is performed  with the patient in reverse Trendelenburg.  +---------+---------------+---------+-----------+----------+--------------+ RIGHT    CompressibilityPhasicitySpontaneityPropertiesThrombus Aging +---------+---------------+---------+-----------+----------+--------------+ CFV      Full           Yes      No                                  +---------+---------------+---------+-----------+----------+--------------+ SFJ      Full                                                        +---------+---------------+---------+-----------+----------+--------------+ FV Prox  Full                                                        +---------+---------------+---------+-----------+----------+--------------+ FV Mid   Full           Yes      Yes                                 +---------+---------------+---------+-----------+----------+--------------+ FV DistalFull                                                        +---------+---------------+---------+-----------+----------+--------------+  PFV      Full                                                        +---------+---------------+---------+-----------+----------+--------------+ POP      Full           Yes      Yes                                 +---------+---------------+---------+-----------+----------+--------------+ PTV      Full                                                        +---------+---------------+---------+-----------+----------+--------------+ PERO     None                                         Acute          +---------+---------------+---------+-----------+----------+--------------+   +---------+---------------+---------+-----------+----------+--------------+ LEFT     CompressibilityPhasicitySpontaneityPropertiesThrombus Aging +---------+---------------+---------+-----------+----------+--------------+ CFV      Full           Yes      Yes                                  +---------+---------------+---------+-----------+----------+--------------+ SFJ      Full                                                        +---------+---------------+---------+-----------+----------+--------------+ FV Prox  Full                                                        +---------+---------------+---------+-----------+----------+--------------+ FV Mid   Full           Yes      Yes                                 +---------+---------------+---------+-----------+----------+--------------+ FV DistalFull                                                        +---------+---------------+---------+-----------+----------+--------------+ PFV      Full                                                        +---------+---------------+---------+-----------+----------+--------------+  POP      Full           Yes      Yes                                 +---------+---------------+---------+-----------+----------+--------------+ PTV      Full                                                        +---------+---------------+---------+-----------+----------+--------------+ PERO     Full                                                        +---------+---------------+---------+-----------+----------+--------------+     Summary: RIGHT: - Findings consistent with acute deep vein thrombosis involving the right peroneal veins.  - All other veins are patent.  LEFT: - There is no evidence of deep vein thrombosis in the lower extremity.  - No cystic structure found in the popliteal fossa.  *See table(s) above for measurements and observations. Electronically signed by Penne Colorado MD on 09/15/2024 at 3:35:09 PM.    Final    ECHOCARDIOGRAM COMPLETE Result Date: 09/15/2024    ECHOCARDIOGRAM REPORT   Patient Name:   Tammy Ball Date of Exam: 09/15/2024 Medical Rec #:  [id number]      Height:       63.0 in Accession #:    [id number]     Weight:       152.0 lb Date of  Birth:  [birth year]      BSA:          1.721 m Patient Age:    82 years       BP:           89/65 mmHg Patient Gender: F              HR:           89 bpm. Exam Location:  Inpatient Procedure: 2D Echo, Cardiac Doppler and Color Doppler (Both Spectral and Color            Flow Doppler were utilized during procedure). Indications:    PE I26.09  History:        Patient has prior history of Echocardiogram examinations, most                 recent 08/27/2024.  Sonographer:    Tinnie Gosling RDCS Referring Phys: [id number] NORLEEN BIRCH PAYNE  Sonographer Comments: Technically difficult study due to poor echo windows. Image acquisition challenging due to COPD and Image acquisition challenging due to respiratory motion. IMPRESSIONS  1. Left ventricular ejection fraction, by estimation, is 70 to 75%. The left ventricle has hyperdynamic function. The left ventricle has no regional wall motion abnormalities. There is mild left ventricular hypertrophy. Left ventricular diastolic parameters are indeterminate.  2. RV mid and apex not well seen; normal annular excursion. Right ventricular systolic function is normal. The right ventricular size is normal.  3. The mitral valve is grossly normal. No evidence of mitral valve regurgitation. No evidence of mitral stenosis.  4. The aortic valve was  not well visualized. Aortic valve regurgitation is not visualized. No aortic stenosis is present. Comparison(s): Prior images reviewed side by side. Difficult comparison- this study is more technically challenging. FINDINGS  Left Ventricle: Intracavitary gradient related to dynamnic flow. Left ventricular ejection fraction, by estimation, is 70 to 75%. The left ventricle has hyperdynamic function. The left ventricle has no regional wall motion abnormalities. The left ventricular internal cavity size was normal in size. There is mild left ventricular hypertrophy. Left ventricular diastolic parameters are indeterminate. Right Ventricle: RV mid and apex not  well seen; normal annular excursion. The right ventricular size is normal. No increase in right ventricular wall thickness. Right ventricular systolic function is normal. Left Atrium: Left atrial size was normal in size. Right Atrium: Right atrial size was normal in size. Pericardium: There is no evidence of pericardial effusion. Mitral Valve: The mitral valve is grossly normal. No evidence of mitral valve regurgitation. No evidence of mitral valve stenosis. Tricuspid Valve: The tricuspid valve is grossly normal. Tricuspid valve regurgitation is not demonstrated. No evidence of tricuspid stenosis. Aortic Valve: The aortic valve was not well visualized. Aortic valve regurgitation is not visualized. No aortic stenosis is present. Pulmonic Valve: The pulmonic valve was not well visualized. Pulmonic valve regurgitation is not visualized. No evidence of pulmonic stenosis. Aorta: The aortic root and ascending aorta are structurally normal, with no evidence of dilitation. IAS/Shunts: The interatrial septum was not well visualized.  LEFT VENTRICLE PLAX 2D LVIDd:         2.90 cm   Diastology LVIDs:         1.10 cm   LV e' medial:    4.24 cm/s LV PW:         1.30 cm   LV E/e' medial:  21.2 LV IVS:        1.30 cm   LV e' lateral:   4.24 cm/s LVOT diam:     1.90 cm   LV E/e' lateral: 21.2 LV SV:         85 LV SV Index:   49 LVOT Area:     2.84 cm LV IVRT:       132 msec  RIGHT VENTRICLE             IVC RV S prime:     15.20 cm/s  IVC diam: 1.80 cm TAPSE (M-mode): 1.6 cm LEFT ATRIUM           Index        RIGHT ATRIUM           Index LA diam:      2.70 cm 1.57 cm/m   RA Area:     21.00 cm LA Vol (A4C): 50.1 ml 29.11 ml/m  RA Volume:   55.20 ml  32.08 ml/m  AORTIC VALVE LVOT Vmax:   156.00 cm/s LVOT Vmean:  110.000 cm/s LVOT VTI:    0.299 m  AORTA Ao Root diam: 2.70 cm Ao Asc diam:  3.20 cm MITRAL VALVE MV Area (PHT): 4.71 cm    SHUNTS MV Decel Time: 161 msec    Systemic VTI:  0.30 m MV E velocity: 90.00 cm/s  Systemic  Diam: 1.90 cm MV A velocity: 86.80 cm/s MV E/A ratio:  1.04 Stanly Leavens MD Electronically signed by Stanly Leavens MD Signature Date/Time: 09/15/2024/2:40:30 PM    Final    CT Angio Chest PE W and/or Wo Contrast Result Date: 09/15/2024 CLINICAL DATA:  Cough which shortness-of-breath and body aches since this  morning. High probability of pulmonary emboli. EXAM: CT ANGIOGRAPHY CHEST WITH CONTRAST TECHNIQUE: Multidetector CT imaging of the chest was performed using the standard protocol during bolus administration of intravenous contrast. Multiplanar CT image reconstructions and MIPs were obtained to evaluate the vascular anatomy. RADIATION DOSE REDUCTION: This exam was performed according to the departmental dose-optimization program which includes automated exposure control, adjustment of the mA and/or kV according to patient size and/or use of iterative reconstruction technique. CONTRAST:  60mL OMNIPAQUE  IOHEXOL  350 MG/ML SOLN COMPARISON:  08/22/2024 FINDINGS: Cardiovascular: Mild stable cardiomegaly. Mild calcified plaque over the left main, left anterior descending and left lateral circumflex coronary arteries. Thoracic aorta is normal in caliber. There is mild calcified plaque over the thoracic aorta. Pulmonary arterial system is well opacified and demonstrates multiple emboli over the segmental right pulmonary arteries involving upper middle and lower lobar arteries. Left upper lobe segmental pulmonary artery emboli noted. RV/LV ratio is greater than 1 compatible with right heart strain. Mediastinum/Nodes: No mediastinal or hilar adenopathy. Postsurgical changes over the right hilar region. Remaining mediastinal structures are unremarkable. Lungs/Pleura: Lungs are adequately inflated demonstrate mild centrilobular emphysematous disease. New mild triangular for pleural based opacification over the right lower lobe with associated linear density which may represent atelectasis or infarction. New  moderate consolidation over the left lower lobe with associated small left pleural effusion. Findings likely due to atelectasis although infection is possible. Airways are unremarkable. Upper Abdomen: No acute findings. Moderate calcified plaque over the abdominal aorta. Musculoskeletal: Multiple stable compression fractures over the cervicothoracic junction and upper thoracic spine. Patient has undergone interval kyphoplasty of the more inferior thoracic spine compression fracture. Review of the MIP images confirms the above findings. IMPRESSION: 1. Multiple bilateral segmental pulmonary artery emboli with evidence of right heart strain. 2. New mild triangular pleural based opacification over the right lower lobe with associated linear density which may represent atelectasis or infarction. 3. New moderate consolidation over the left lower lobe with associated small left pleural effusion. Findings likely due to atelectasis, although infection is possible. 4. Mild stable cardiomegaly. Atherosclerotic coronary artery disease. 5. Multiple stable compression fractures over the cervicothoracic junction and upper thoracic spine. Interval kyphoplasty of the more inferior thoracic spine compression fracture. Aortic Atherosclerosis (ICD10-I70.0) and Emphysema (ICD10-J43.9). Critical Value/emergent results were called by telephone at the time of interpretation on 09/15/2024 at 12:39 p.m. to provider Dr. Elnor, who verbally acknowledged these results. Electronically Signed   By: Toribio Agreste M.D.   On: 09/15/2024 12:39   DG Chest 2 View Result Date: 09/15/2024 EXAM: 2 VIEW(S) XRAY OF THE CHEST 09/15/2024 09:56:23 AM COMPARISON: 08/29/2024 CLINICAL HISTORY: shob FINDINGS: LUNGS AND PLEURA: Mild left basilar airspace opacities slightly increased. Small left pleural effusion. No pulmonary edema. No pneumothorax. HEART AND MEDIASTINUM: Cardiomegaly, unchanged. BONES AND SOFT TISSUES: Interval T7 thoracic vertebral augmentation.  Stable postoperative changes of T3 and T4 cement augmentation. Partially imaged cervical spinal fusion hardware in place. IMPRESSION: 1. Mildly increased left basilar airspace opacities, which may reflect atelectasis or developing pneumonia. 2. Small left pleural effusion. 3. Cardiomegaly, unchanged. Electronically signed by: Dayne Hassell MD 09/15/2024 10:45 AM EST RP Workstation: [id number]   IR KYPHO THORACIC WITH BONE BIOPSY Result Date: 09/01/2024 CLINICAL DATA:  Acute T7 compression fracture Briefly, 83 year old female with acute, symptomatic thoracic vertebral body compression fractures, greatest at T7. Additional fractures on MRI at T5 and T6, to a mild degree. Failed conservative management EXAM: T7 VERTEBRAL BODY AUGMENTATION WITH BALLOON KYPHOPLASTY COMPARISON:  CT T-spine, 08/22/2024.  MR T-spine, 08/27/2024. MEDICATIONS: As antibiotic prophylaxis, Ancef  2 gm IV was ordered pre-procedure and administered intravenously within 1 hour of incision. ANESTHESIA/SEDATION: Moderate (conscious) sedation was employed during this procedure. A total of Versed  1.5 mg and Fentanyl  100 mcg was administered intravenously. Moderate Sedation Time: 34 minutes. The patient's level of consciousness and vital signs were monitored continuously by radiology nursing throughout the procedure under my direct supervision. FLUOROSCOPY: Radiation Exposure Index and estimated peak skin dose (PSD); Reference air kerma (RAK), 187 mGy. COMPLICATIONS: None immediate. PROCEDURE: The procedure, risks (including but not limited to bleeding, infection, organ damage), benefits, and alternatives were explained to the patient and/or patient's representative. Questions regarding the procedure were encouraged and answered. The patient understands and consents to the procedure. The patient was placed prone on the fluoroscopic table. The skin overlying the lower lumbar spine region was then prepped and draped in the usual sterile fashion.  Maximal barrier sterile technique was utilized including caps, mask, sterile gowns, sterile gloves, sterile drape, hand hygiene and skin antiseptic. Intravenous Fentanyl  and Versed  were administered as conscious sedation during continuous cardiorespiratory monitoring by the radiology RN. The LEFT pedicle at T7 was then infiltrated with 0.5% Marcaine  followed by the advancement of a Kyphon trocar needle through the pedicle into the posterior one-third of the vertebral body. The osteo drill was advanced to the anterior third of the vertebral body. The osteo drill was retracted. Through the working cannula, a Kyphon inflatable bone tamp 15 x 3 was advanced and positioned with the distal marker approximately 5 mm from the anterior aspect of the cortex. Appropriate positioning was confirmed on the AP projection. At this time, the balloon was expanded using contrast via a Kyphon inflation syringe device via micro tubing. In similar fashion, the RIGHT T7 pedicle was infiltrated with 0.5% Marcaine  followed by the advancement of a second Kyphon trocar needle through the right pedicle into the posterior third of the vertebral body. Subsequently, the osteo drill was coaxially advanced to the anterior right third. The osteo drill was exchanged for a Kyphon inflatable bone tamp 15 x 3, advanced to the 5 mm of the anterior aspect of the cortex. The balloon was then expanded using contrast as above. Inflations were continued until there was near apposition with the superior end plate. At this time, methylmethacrylate mixture was reconstituted in the Kyphon bone mixing device system. This was then loaded into the delivery mechanism, attached to Kyphon bone fillers. The balloons were deflated and removed followed by the instillation of methylmethacrylate mixture with excellent filling in the AP and lateral projections. No extravasation was noted in the disk spaces or posteriorly into the spinal canal. No epidural venous  contamination was seen. The working cannulae and the bone filler were then retrieved and removed. Hemostasis was achieved with manual compression. The patient tolerated the procedure well without immediate postprocedural complication. FINDINGS: *Adequate cement filling of the T7 vertebral body on both the AP and lateral projections. *No extravasation was noted in the disk spaces or posteriorly into the spinal canal, nor epidural venous contamination was seen. *The patient has suffered a fractures of T5, T6 and T7. It is recommended that patients aged 16 years or older be evaluated for possible testing or treatment of osteoporosis. A copy of this procedure report is made available to the patient's referring physician IMPRESSION: Successful T7 vertebral body cement augmentation with balloon kyphoplasty. Per CMS PQRS reporting requirements (PQRS Measure 24): Given the patient's age of greater than 50 and the fracture  site (hip, distal radius, or spine), the patient should be tested for osteoporosis using DXA, and the appropriate treatment considered based on the DXA result. RECOMMENDATIONS: Should the patient fail to achieve adequate pain relief from her thoracic vertebral body compression deformities after this augmentation, kyphoplasty at T5 and T6 levels could be pursued. Thom Hall, MD [facility] Electronically Signed   By: Thom Hall M.D.   On: 09/01/2024 17:07    Microbiology No results found for this or any previous visit (from the past 240 hours).  Lab Basic Metabolic Panel: Recent Labs  Lab 09/23/24 0340  NA 141  K 4.6  CL 99  CO2 26  GLUCOSE 104*  BUN 60*  CREATININE 3.12*  CALCIUM  9.2  MG 2.4  PHOS 6.0*   Liver Function Tests: No results for input(s): AST, ALT, ALKPHOS, BILITOT, PROT, ALBUMIN  in the last 168 hours. No results for input(s): LIPASE, AMYLASE in the last 168 hours. No results for input(s):  AMMONIA in the last 168 hours. CBC: Recent Labs  Lab 09/23/24 0340  WBC 9.0  HGB 8.9*  HCT 27.7*  MCV 97.5  PLT 130*   Cardiac Enzymes: No results for input(s): CKTOTAL, CKMB, CKMBINDEX, TROPONINI in the last 168 hours. Sepsis Labs: Recent Labs  Lab 09/23/24 0340  WBC 9.0    Procedures/Operations  N/A   PAULA SOUTHERLY 09/29/2024, 9:19 PM

## 2024-10-12 NOTE — Progress Notes (Signed)
 Patient expired with son & daughter at bedside. Two RN- [employer] confirmed no pulse and time of death at 01:32. Elink MD notified.   Wasted  30ml of Dilaudid  gtt with Art Therapist and dispose at [facility] [place].

## 2024-10-12 DEATH — deceased

## 2024-11-01 ENCOUNTER — Other Ambulatory Visit: Payer: Self-pay | Admitting: Emergency Medicine

## 2025-03-12 ENCOUNTER — Other Ambulatory Visit

## 2025-03-18 ENCOUNTER — Ambulatory Visit: Admitting: Internal Medicine

## 2025-06-10 ENCOUNTER — Ambulatory Visit
